# Patient Record
Sex: Female | Born: 1940 | Race: White | Hispanic: No | Smoking: Never smoker
Health system: Southern US, Community
[De-identification: ages and names within clinical notes are randomized; demographics above are authoritative.]

## PROBLEM LIST (undated history)

## (undated) DIAGNOSIS — M26609 Unspecified temporomandibular joint disorder, unspecified side: Secondary | ICD-10-CM

## (undated) DIAGNOSIS — Z923 Personal history of irradiation: Secondary | ICD-10-CM

## (undated) DIAGNOSIS — A498 Other bacterial infections of unspecified site: Secondary | ICD-10-CM

## (undated) DIAGNOSIS — T4145XA Adverse effect of unspecified anesthetic, initial encounter: Secondary | ICD-10-CM

## (undated) DIAGNOSIS — K610 Anal abscess: Secondary | ICD-10-CM

## (undated) DIAGNOSIS — R0989 Other specified symptoms and signs involving the circulatory and respiratory systems: Secondary | ICD-10-CM

## (undated) DIAGNOSIS — K219 Gastro-esophageal reflux disease without esophagitis: Secondary | ICD-10-CM

## (undated) DIAGNOSIS — C4491 Basal cell carcinoma of skin, unspecified: Secondary | ICD-10-CM

## (undated) DIAGNOSIS — J302 Other seasonal allergic rhinitis: Secondary | ICD-10-CM

## (undated) DIAGNOSIS — C50319 Malignant neoplasm of lower-inner quadrant of unspecified female breast: Secondary | ICD-10-CM

## (undated) DIAGNOSIS — Z9889 Other specified postprocedural states: Secondary | ICD-10-CM

## (undated) DIAGNOSIS — K50114 Crohn's disease of large intestine with abscess: Secondary | ICD-10-CM

## (undated) DIAGNOSIS — C50312 Malignant neoplasm of lower-inner quadrant of left female breast: Secondary | ICD-10-CM

## (undated) DIAGNOSIS — M1990 Unspecified osteoarthritis, unspecified site: Secondary | ICD-10-CM

## (undated) DIAGNOSIS — E785 Hyperlipidemia, unspecified: Secondary | ICD-10-CM

## (undated) DIAGNOSIS — F419 Anxiety disorder, unspecified: Secondary | ICD-10-CM

## (undated) DIAGNOSIS — F329 Major depressive disorder, single episode, unspecified: Secondary | ICD-10-CM

## (undated) DIAGNOSIS — Z5189 Encounter for other specified aftercare: Secondary | ICD-10-CM

## (undated) DIAGNOSIS — D649 Anemia, unspecified: Secondary | ICD-10-CM

## (undated) DIAGNOSIS — Z8489 Family history of other specified conditions: Secondary | ICD-10-CM

## (undated) DIAGNOSIS — Z98811 Dental restoration status: Secondary | ICD-10-CM

## (undated) DIAGNOSIS — R112 Nausea with vomiting, unspecified: Secondary | ICD-10-CM

## (undated) DIAGNOSIS — C3490 Malignant neoplasm of unspecified part of unspecified bronchus or lung: Secondary | ICD-10-CM

## (undated) DIAGNOSIS — F32A Depression, unspecified: Secondary | ICD-10-CM

## (undated) DIAGNOSIS — T7840XA Allergy, unspecified, initial encounter: Secondary | ICD-10-CM

## (undated) DIAGNOSIS — K30 Functional dyspepsia: Secondary | ICD-10-CM

## (undated) DIAGNOSIS — T8859XA Other complications of anesthesia, initial encounter: Secondary | ICD-10-CM

## (undated) DIAGNOSIS — E538 Deficiency of other specified B group vitamins: Secondary | ICD-10-CM

## (undated) DIAGNOSIS — K631 Perforation of intestine (nontraumatic): Secondary | ICD-10-CM

## (undated) HISTORY — DX: Encounter for other specified aftercare: Z51.89

## (undated) HISTORY — PX: BASAL CELL CARCINOMA EXCISION: SHX1214

## (undated) HISTORY — DX: Anemia, unspecified: D64.9

## (undated) HISTORY — DX: Hyperlipidemia, unspecified: E78.5

## (undated) HISTORY — DX: Other bacterial infections of unspecified site: A49.8

## (undated) HISTORY — DX: Unspecified osteoarthritis, unspecified site: M19.90

## (undated) HISTORY — DX: Allergy, unspecified, initial encounter: T78.40XA

## (undated) HISTORY — PX: BREAST ENHANCEMENT SURGERY: SHX7

## (undated) HISTORY — DX: Malignant neoplasm of lower-inner quadrant of unspecified female breast: C50.319

## (undated) HISTORY — PX: COLONOSCOPY WITH PROPOFOL: SHX5780

## (undated) HISTORY — DX: Basal cell carcinoma of skin, unspecified: C44.91

## (undated) HISTORY — DX: Malignant neoplasm of unspecified part of unspecified bronchus or lung: C34.90

## (undated) HISTORY — PX: INNER EAR SURGERY: SHX679

## (undated) HISTORY — DX: Deficiency of other specified B group vitamins: E53.8

## (undated) HISTORY — DX: Gastro-esophageal reflux disease without esophagitis: K21.9

## (undated) HISTORY — DX: Depression, unspecified: F32.A

## (undated) HISTORY — PX: APPENDECTOMY: SHX54

## (undated) HISTORY — DX: Major depressive disorder, single episode, unspecified: F32.9

## (undated) HISTORY — PX: AUGMENTATION MAMMAPLASTY: SUR837

## (undated) HISTORY — DX: Anxiety disorder, unspecified: F41.9

## (undated) HISTORY — PX: ABDOMINAL HYSTERECTOMY: SHX81

## (undated) HISTORY — DX: Perforation of intestine (nontraumatic): K63.1

---

## 1998-10-04 ENCOUNTER — Ambulatory Visit (HOSPITAL_COMMUNITY): Admission: RE | Admit: 1998-10-04 | Discharge: 1998-10-04 | Payer: Self-pay | Admitting: Gastroenterology

## 1999-05-19 ENCOUNTER — Encounter: Payer: Self-pay | Admitting: Emergency Medicine

## 1999-05-19 ENCOUNTER — Emergency Department (HOSPITAL_COMMUNITY): Admission: EM | Admit: 1999-05-19 | Discharge: 1999-05-19 | Payer: Self-pay | Admitting: Emergency Medicine

## 2003-06-18 ENCOUNTER — Emergency Department (HOSPITAL_COMMUNITY): Admission: EM | Admit: 2003-06-18 | Discharge: 2003-06-18 | Payer: Self-pay | Admitting: Emergency Medicine

## 2004-03-15 ENCOUNTER — Ambulatory Visit: Payer: Self-pay | Admitting: Oncology

## 2004-04-26 ENCOUNTER — Emergency Department (HOSPITAL_COMMUNITY): Admission: EM | Admit: 2004-04-26 | Discharge: 2004-04-26 | Payer: Self-pay | Admitting: Emergency Medicine

## 2004-05-10 ENCOUNTER — Ambulatory Visit: Payer: Self-pay | Admitting: Oncology

## 2004-06-04 ENCOUNTER — Other Ambulatory Visit: Admission: RE | Admit: 2004-06-04 | Discharge: 2004-06-04 | Payer: Self-pay | Admitting: Family Medicine

## 2004-06-04 ENCOUNTER — Ambulatory Visit: Payer: Self-pay | Admitting: Family Medicine

## 2004-06-12 ENCOUNTER — Ambulatory Visit: Payer: Self-pay | Admitting: Family Medicine

## 2004-06-27 ENCOUNTER — Encounter: Admission: RE | Admit: 2004-06-27 | Discharge: 2004-06-27 | Payer: Self-pay | Admitting: Family Medicine

## 2004-07-04 ENCOUNTER — Ambulatory Visit: Payer: Self-pay | Admitting: Oncology

## 2004-07-08 ENCOUNTER — Encounter: Admission: RE | Admit: 2004-07-08 | Discharge: 2004-07-08 | Payer: Self-pay | Admitting: Family Medicine

## 2004-07-09 ENCOUNTER — Ambulatory Visit: Payer: Self-pay | Admitting: Family Medicine

## 2004-07-09 ENCOUNTER — Encounter: Admission: RE | Admit: 2004-07-09 | Discharge: 2004-07-09 | Payer: Self-pay | Admitting: Family Medicine

## 2004-08-14 ENCOUNTER — Ambulatory Visit: Payer: Self-pay | Admitting: Gastroenterology

## 2004-08-29 ENCOUNTER — Ambulatory Visit: Payer: Self-pay | Admitting: Gastroenterology

## 2004-08-29 ENCOUNTER — Ambulatory Visit: Payer: Self-pay | Admitting: Oncology

## 2004-11-13 ENCOUNTER — Ambulatory Visit: Payer: Self-pay | Admitting: Oncology

## 2005-01-02 ENCOUNTER — Ambulatory Visit: Payer: Self-pay | Admitting: Family Medicine

## 2005-01-08 ENCOUNTER — Ambulatory Visit: Payer: Self-pay | Admitting: Oncology

## 2005-01-08 ENCOUNTER — Ambulatory Visit: Payer: Self-pay | Admitting: Family Medicine

## 2005-01-10 ENCOUNTER — Ambulatory Visit: Payer: Self-pay | Admitting: Family Medicine

## 2005-01-17 ENCOUNTER — Ambulatory Visit: Payer: Self-pay | Admitting: Family Medicine

## 2005-01-24 ENCOUNTER — Ambulatory Visit: Payer: Self-pay | Admitting: Family Medicine

## 2005-01-31 ENCOUNTER — Ambulatory Visit: Payer: Self-pay | Admitting: Family Medicine

## 2005-02-05 ENCOUNTER — Ambulatory Visit: Payer: Self-pay | Admitting: Family Medicine

## 2005-02-12 ENCOUNTER — Ambulatory Visit: Payer: Self-pay | Admitting: Family Medicine

## 2005-02-14 ENCOUNTER — Ambulatory Visit: Payer: Self-pay | Admitting: Cardiovascular Disease

## 2005-02-26 ENCOUNTER — Ambulatory Visit: Payer: Self-pay | Admitting: Internal Medicine

## 2005-02-28 ENCOUNTER — Ambulatory Visit: Payer: Self-pay | Admitting: Family Medicine

## 2005-03-03 ENCOUNTER — Ambulatory Visit: Payer: Self-pay | Admitting: Internal Medicine

## 2005-03-28 ENCOUNTER — Ambulatory Visit: Payer: Self-pay | Admitting: Family Medicine

## 2005-05-02 ENCOUNTER — Ambulatory Visit: Payer: Self-pay | Admitting: Family Medicine

## 2005-05-30 ENCOUNTER — Ambulatory Visit: Payer: Self-pay | Admitting: Family Medicine

## 2005-06-27 ENCOUNTER — Ambulatory Visit: Payer: Self-pay | Admitting: Family Medicine

## 2005-07-11 ENCOUNTER — Ambulatory Visit: Payer: Self-pay | Admitting: Family Medicine

## 2005-07-28 ENCOUNTER — Ambulatory Visit: Payer: Self-pay | Admitting: Family Medicine

## 2005-08-28 ENCOUNTER — Ambulatory Visit: Payer: Self-pay | Admitting: Family Medicine

## 2005-09-30 ENCOUNTER — Ambulatory Visit: Payer: Self-pay | Admitting: Family Medicine

## 2005-10-31 ENCOUNTER — Ambulatory Visit: Payer: Self-pay | Admitting: Family Medicine

## 2005-11-28 ENCOUNTER — Ambulatory Visit: Payer: Self-pay | Admitting: Family Medicine

## 2005-12-26 ENCOUNTER — Ambulatory Visit: Payer: Self-pay | Admitting: Family Medicine

## 2005-12-30 ENCOUNTER — Encounter: Admission: RE | Admit: 2005-12-30 | Discharge: 2005-12-30 | Payer: Self-pay | Admitting: Family Medicine

## 2005-12-30 ENCOUNTER — Ambulatory Visit: Payer: Self-pay | Admitting: Family Medicine

## 2006-01-01 ENCOUNTER — Encounter: Admission: RE | Admit: 2006-01-01 | Discharge: 2006-01-01 | Payer: Self-pay | Admitting: Family Medicine

## 2006-01-16 ENCOUNTER — Encounter: Admission: RE | Admit: 2006-01-16 | Discharge: 2006-01-16 | Payer: Self-pay | Admitting: Family Medicine

## 2006-01-20 ENCOUNTER — Ambulatory Visit: Payer: Self-pay | Admitting: Family Medicine

## 2006-01-23 ENCOUNTER — Ambulatory Visit: Payer: Self-pay | Admitting: Family Medicine

## 2006-02-20 ENCOUNTER — Ambulatory Visit: Payer: Self-pay | Admitting: Family Medicine

## 2006-03-09 ENCOUNTER — Ambulatory Visit: Payer: Self-pay | Admitting: Family Medicine

## 2006-03-25 ENCOUNTER — Ambulatory Visit: Payer: Self-pay | Admitting: Family Medicine

## 2006-04-22 ENCOUNTER — Ambulatory Visit: Payer: Self-pay | Admitting: Family Medicine

## 2006-05-22 ENCOUNTER — Ambulatory Visit: Payer: Self-pay | Admitting: Family Medicine

## 2006-06-11 ENCOUNTER — Ambulatory Visit: Payer: Self-pay | Admitting: Family Medicine

## 2006-07-02 ENCOUNTER — Ambulatory Visit: Payer: Self-pay | Admitting: Family Medicine

## 2006-08-03 ENCOUNTER — Ambulatory Visit: Payer: Self-pay | Admitting: Family Medicine

## 2006-08-12 ENCOUNTER — Emergency Department (HOSPITAL_COMMUNITY): Admission: EM | Admit: 2006-08-12 | Discharge: 2006-08-12 | Payer: Self-pay | Admitting: Emergency Medicine

## 2006-08-12 DIAGNOSIS — K449 Diaphragmatic hernia without obstruction or gangrene: Secondary | ICD-10-CM | POA: Insufficient documentation

## 2006-08-12 DIAGNOSIS — F32A Depression, unspecified: Secondary | ICD-10-CM | POA: Insufficient documentation

## 2006-08-12 DIAGNOSIS — F329 Major depressive disorder, single episode, unspecified: Secondary | ICD-10-CM

## 2006-08-12 DIAGNOSIS — E538 Deficiency of other specified B group vitamins: Secondary | ICD-10-CM | POA: Insufficient documentation

## 2006-08-15 DIAGNOSIS — Z8719 Personal history of other diseases of the digestive system: Secondary | ICD-10-CM | POA: Insufficient documentation

## 2006-08-31 ENCOUNTER — Ambulatory Visit: Payer: Self-pay | Admitting: Family Medicine

## 2006-09-11 ENCOUNTER — Ambulatory Visit: Payer: Self-pay | Admitting: Family Medicine

## 2006-09-11 LAB — CONVERTED CEMR LAB
ALT: 20 units/L (ref 0–40)
AST: 21 units/L (ref 0–37)
Albumin: 4.1 g/dL (ref 3.5–5.2)
BUN: 13 mg/dL (ref 6–23)
Basophils Absolute: 0 10*3/uL (ref 0.0–0.1)
Bilirubin, Direct: 0.1 mg/dL (ref 0.0–0.3)
Calcium: 9.5 mg/dL (ref 8.4–10.5)
GFR calc Af Amer: 108 mL/min
GFR calc non Af Amer: 90 mL/min
HCT: 44.5 % (ref 36.0–46.0)
Hemoglobin: 15.4 g/dL — ABNORMAL HIGH (ref 12.0–15.0)
Lymphocytes Relative: 22.2 % (ref 12.0–46.0)
MCV: 92.4 fL (ref 78.0–100.0)
Neutro Abs: 4.1 10*3/uL (ref 1.4–7.7)
Neutrophils Relative %: 67.7 % (ref 43.0–77.0)
Platelets: 180 10*3/uL (ref 150–400)
RBC: 4.81 M/uL (ref 3.87–5.11)
RDW: 12 % (ref 11.5–14.6)
TSH: 0.93 microintl units/mL (ref 0.35–5.50)
Total CHOL/HDL Ratio: 5
Total Protein: 7 g/dL (ref 6.0–8.3)
VLDL: 36 mg/dL (ref 0–40)

## 2006-09-28 ENCOUNTER — Ambulatory Visit: Payer: Self-pay | Admitting: Family Medicine

## 2006-09-28 ENCOUNTER — Encounter (INDEPENDENT_AMBULATORY_CARE_PROVIDER_SITE_OTHER): Payer: Self-pay | Admitting: *Deleted

## 2006-10-14 ENCOUNTER — Encounter: Admission: RE | Admit: 2006-10-14 | Discharge: 2006-10-14 | Payer: Self-pay | Admitting: Family Medicine

## 2006-10-14 ENCOUNTER — Encounter: Payer: Self-pay | Admitting: Family Medicine

## 2006-10-19 ENCOUNTER — Encounter (INDEPENDENT_AMBULATORY_CARE_PROVIDER_SITE_OTHER): Payer: Self-pay | Admitting: *Deleted

## 2006-10-20 ENCOUNTER — Encounter (INDEPENDENT_AMBULATORY_CARE_PROVIDER_SITE_OTHER): Payer: Self-pay | Admitting: *Deleted

## 2006-10-23 ENCOUNTER — Ambulatory Visit: Payer: Self-pay | Admitting: Family Medicine

## 2006-11-23 ENCOUNTER — Ambulatory Visit: Payer: Self-pay | Admitting: Family Medicine

## 2006-12-23 ENCOUNTER — Ambulatory Visit: Payer: Self-pay | Admitting: Family Medicine

## 2007-01-19 ENCOUNTER — Telehealth (INDEPENDENT_AMBULATORY_CARE_PROVIDER_SITE_OTHER): Payer: Self-pay | Admitting: *Deleted

## 2007-01-20 ENCOUNTER — Ambulatory Visit: Payer: Self-pay | Admitting: Family Medicine

## 2007-03-08 ENCOUNTER — Ambulatory Visit: Payer: Self-pay | Admitting: Family Medicine

## 2007-03-10 ENCOUNTER — Encounter: Admission: RE | Admit: 2007-03-10 | Discharge: 2007-03-10 | Payer: Self-pay | Admitting: Family Medicine

## 2007-07-27 ENCOUNTER — Ambulatory Visit: Payer: Self-pay | Admitting: Family Medicine

## 2007-07-27 DIAGNOSIS — D235 Other benign neoplasm of skin of trunk: Secondary | ICD-10-CM | POA: Insufficient documentation

## 2007-07-30 ENCOUNTER — Encounter (INDEPENDENT_AMBULATORY_CARE_PROVIDER_SITE_OTHER): Payer: Self-pay | Admitting: *Deleted

## 2007-08-05 ENCOUNTER — Ambulatory Visit: Payer: Self-pay | Admitting: Family Medicine

## 2007-12-10 ENCOUNTER — Ambulatory Visit: Payer: Self-pay | Admitting: Family Medicine

## 2007-12-13 ENCOUNTER — Ambulatory Visit: Payer: Self-pay | Admitting: Family Medicine

## 2007-12-13 ENCOUNTER — Encounter (INDEPENDENT_AMBULATORY_CARE_PROVIDER_SITE_OTHER): Payer: Self-pay | Admitting: *Deleted

## 2007-12-15 ENCOUNTER — Telehealth: Payer: Self-pay | Admitting: Internal Medicine

## 2007-12-17 ENCOUNTER — Telehealth (INDEPENDENT_AMBULATORY_CARE_PROVIDER_SITE_OTHER): Payer: Self-pay | Admitting: *Deleted

## 2007-12-17 LAB — CONVERTED CEMR LAB: Vit D, 1,25-Dihydroxy: 36 (ref 30–89)

## 2007-12-23 ENCOUNTER — Telehealth (INDEPENDENT_AMBULATORY_CARE_PROVIDER_SITE_OTHER): Payer: Self-pay | Admitting: *Deleted

## 2007-12-25 LAB — CONVERTED CEMR LAB
BUN: 11 mg/dL (ref 6–23)
CO2: 29 meq/L (ref 19–32)
Calcium: 9.3 mg/dL (ref 8.4–10.5)
Chloride: 108 meq/L (ref 96–112)
Creatinine, Ser: 0.7 mg/dL (ref 0.4–1.2)
Eosinophils Absolute: 0.1 10*3/uL (ref 0.0–0.7)
GFR calc non Af Amer: 89 mL/min
Glucose, Bld: 95 mg/dL (ref 70–99)
HCT: 45.1 % (ref 36.0–46.0)
HDL: 37.5 mg/dL — ABNORMAL LOW (ref >39.0)
Hemoglobin: 15.6 g/dL — ABNORMAL HIGH (ref 12.0–15.0)
LDL Cholesterol: 125 mg/dL — ABNORMAL HIGH (ref 0–99)
MCHC: 34.6 g/dL (ref 30.0–36.0)
MCV: 93.5 fL (ref 78.0–100.0)
Neutro Abs: 4.6 10*3/uL (ref 1.4–7.7)
Potassium: 4.8 meq/L (ref 3.5–5.1)
RBC: 4.82 M/uL (ref 3.87–5.11)
RDW: 11.6 % (ref 11.5–14.6)
Total CHOL/HDL Ratio: 5.3
Vitamin B-12: 270 pg/mL (ref 211–911)
WBC: 7.1 10*3/uL (ref 4.5–10.5)

## 2007-12-27 ENCOUNTER — Encounter (INDEPENDENT_AMBULATORY_CARE_PROVIDER_SITE_OTHER): Payer: Self-pay | Admitting: *Deleted

## 2008-01-20 ENCOUNTER — Ambulatory Visit: Payer: Self-pay | Admitting: Family Medicine

## 2008-01-20 ENCOUNTER — Encounter: Payer: Self-pay | Admitting: Internal Medicine

## 2008-01-20 DIAGNOSIS — K589 Irritable bowel syndrome without diarrhea: Secondary | ICD-10-CM

## 2008-01-20 DIAGNOSIS — F341 Dysthymic disorder: Secondary | ICD-10-CM

## 2008-01-25 ENCOUNTER — Ambulatory Visit: Payer: Self-pay | Admitting: Internal Medicine

## 2008-01-25 ENCOUNTER — Encounter (INDEPENDENT_AMBULATORY_CARE_PROVIDER_SITE_OTHER): Payer: Self-pay | Admitting: *Deleted

## 2008-01-25 LAB — CONVERTED CEMR LAB: Vit D, 1,25-Dihydroxy: 93 — ABNORMAL HIGH (ref 30–89)

## 2008-02-03 ENCOUNTER — Ambulatory Visit: Payer: Self-pay | Admitting: Internal Medicine

## 2008-02-03 ENCOUNTER — Encounter: Payer: Self-pay | Admitting: Internal Medicine

## 2008-02-04 ENCOUNTER — Encounter: Payer: Self-pay | Admitting: Internal Medicine

## 2008-03-30 ENCOUNTER — Encounter: Admission: RE | Admit: 2008-03-30 | Discharge: 2008-03-30 | Payer: Self-pay | Admitting: Family Medicine

## 2008-04-12 ENCOUNTER — Encounter: Admission: RE | Admit: 2008-04-12 | Discharge: 2008-04-12 | Payer: Self-pay | Admitting: Family Medicine

## 2008-04-12 ENCOUNTER — Encounter: Payer: Self-pay | Admitting: Family Medicine

## 2008-04-18 ENCOUNTER — Encounter (INDEPENDENT_AMBULATORY_CARE_PROVIDER_SITE_OTHER): Payer: Self-pay | Admitting: *Deleted

## 2008-05-29 ENCOUNTER — Ambulatory Visit: Payer: Self-pay | Admitting: Family Medicine

## 2008-05-29 DIAGNOSIS — J019 Acute sinusitis, unspecified: Secondary | ICD-10-CM | POA: Insufficient documentation

## 2008-10-06 ENCOUNTER — Ambulatory Visit: Payer: Self-pay | Admitting: Family Medicine

## 2008-10-06 DIAGNOSIS — R079 Chest pain, unspecified: Secondary | ICD-10-CM | POA: Insufficient documentation

## 2008-12-11 ENCOUNTER — Ambulatory Visit: Payer: Self-pay | Admitting: Family Medicine

## 2008-12-11 DIAGNOSIS — D239 Other benign neoplasm of skin, unspecified: Secondary | ICD-10-CM | POA: Insufficient documentation

## 2008-12-11 DIAGNOSIS — J329 Chronic sinusitis, unspecified: Secondary | ICD-10-CM | POA: Insufficient documentation

## 2008-12-11 LAB — CONVERTED CEMR LAB
Bilirubin Urine: NEGATIVE
Blood in Urine, dipstick: NEGATIVE
Glucose, Urine, Semiquant: NEGATIVE
Nitrite: NEGATIVE
Protein, U semiquant: NEGATIVE
Urobilinogen, UA: NEGATIVE
WBC Urine, dipstick: NEGATIVE

## 2008-12-14 ENCOUNTER — Encounter (INDEPENDENT_AMBULATORY_CARE_PROVIDER_SITE_OTHER): Payer: Self-pay | Admitting: *Deleted

## 2008-12-14 LAB — CONVERTED CEMR LAB
ALT: 16 units/L (ref 0–35)
Albumin: 4.1 g/dL (ref 3.5–5.2)
Alkaline Phosphatase: 75 units/L (ref 39–117)
Basophils Absolute: 0 10*3/uL (ref 0.0–0.1)
Calcium: 9.4 mg/dL (ref 8.4–10.5)
Chloride: 106 meq/L (ref 96–112)
Eosinophils Absolute: 0.1 10*3/uL (ref 0.0–0.7)
Eosinophils Relative: 1.1 % (ref 0.0–5.0)
Glucose, Bld: 97 mg/dL (ref 70–99)
HCT: 43.6 % (ref 36.0–46.0)
HDL: 41.7 mg/dL (ref >39.00)
Lymphocytes Relative: 23.6 % (ref 12.0–46.0)
MCHC: 35.6 g/dL (ref 30.0–36.0)
MCV: 94.2 fL (ref 78.0–100.0)
Monocytes Absolute: 0.3 10*3/uL (ref 0.1–1.0)
Monocytes Relative: 5 % (ref 3.0–12.0)
Neutro Abs: 4.4 10*3/uL (ref 1.4–7.7)
Platelets: 190 10*3/uL (ref 150.0–400.0)
RDW: 11.4 % — ABNORMAL LOW (ref 11.5–14.6)
Sodium: 138 meq/L (ref 135–145)
Total CHOL/HDL Ratio: 4
Vitamin B-12: 450 pg/mL (ref 211–911)

## 2008-12-20 ENCOUNTER — Encounter: Payer: Self-pay | Admitting: Family Medicine

## 2008-12-22 ENCOUNTER — Encounter: Payer: Self-pay | Admitting: Family Medicine

## 2008-12-27 ENCOUNTER — Encounter: Admission: RE | Admit: 2008-12-27 | Discharge: 2008-12-27 | Payer: Self-pay | Admitting: Otolaryngology

## 2009-01-03 ENCOUNTER — Telehealth (INDEPENDENT_AMBULATORY_CARE_PROVIDER_SITE_OTHER): Payer: Self-pay | Admitting: *Deleted

## 2009-05-21 ENCOUNTER — Telehealth (INDEPENDENT_AMBULATORY_CARE_PROVIDER_SITE_OTHER): Payer: Self-pay | Admitting: *Deleted

## 2009-05-23 ENCOUNTER — Telehealth: Payer: Self-pay | Admitting: Family Medicine

## 2009-08-21 ENCOUNTER — Telehealth (INDEPENDENT_AMBULATORY_CARE_PROVIDER_SITE_OTHER): Payer: Self-pay | Admitting: *Deleted

## 2009-10-12 ENCOUNTER — Telehealth (INDEPENDENT_AMBULATORY_CARE_PROVIDER_SITE_OTHER): Payer: Self-pay | Admitting: *Deleted

## 2009-12-13 ENCOUNTER — Ambulatory Visit: Payer: Self-pay | Admitting: Family Medicine

## 2009-12-13 DIAGNOSIS — E559 Vitamin D deficiency, unspecified: Secondary | ICD-10-CM | POA: Insufficient documentation

## 2009-12-13 DIAGNOSIS — Z780 Asymptomatic menopausal state: Secondary | ICD-10-CM | POA: Insufficient documentation

## 2009-12-13 LAB — CONVERTED CEMR LAB
Bilirubin Urine: NEGATIVE
Glucose, Urine, Semiquant: NEGATIVE
Ketones, urine, test strip: NEGATIVE
pH: 5

## 2009-12-14 ENCOUNTER — Telehealth: Payer: Self-pay | Admitting: Family Medicine

## 2009-12-14 ENCOUNTER — Encounter: Payer: Self-pay | Admitting: Family Medicine

## 2009-12-14 LAB — CONVERTED CEMR LAB
ALT: 15 units/L (ref 0–35)
Alkaline Phosphatase: 86 units/L (ref 39–117)
Basophils Absolute: 0 10*3/uL (ref 0.0–0.1)
Basophils Relative: 0.6 % (ref 0.0–3.0)
Bilirubin, Direct: 0.2 mg/dL (ref 0.0–0.3)
CO2: 27 meq/L (ref 19–32)
Creatinine, Ser: 0.7 mg/dL (ref 0.4–1.2)
Eosinophils Absolute: 0.1 10*3/uL (ref 0.0–0.7)
GFR calc non Af Amer: 96.3 mL/min (ref >60)
Glucose, Bld: 86 mg/dL (ref 70–99)
HCT: 45.8 % (ref 36.0–46.0)
Lymphocytes Relative: 18.7 % (ref 12.0–46.0)
Lymphs Abs: 1.7 10*3/uL (ref 0.7–4.0)
Monocytes Absolute: 0.5 10*3/uL (ref 0.1–1.0)
Potassium: 4.4 meq/L (ref 3.5–5.1)
Total CHOL/HDL Ratio: 4
Total Protein: 7.1 g/dL (ref 6.0–8.3)

## 2009-12-17 LAB — CONVERTED CEMR LAB: Vit D, 25-Hydroxy: 40 ng/mL (ref 30–89)

## 2010-01-03 ENCOUNTER — Encounter: Admission: RE | Admit: 2010-01-03 | Discharge: 2010-01-03 | Payer: Self-pay | Admitting: Family Medicine

## 2010-01-03 ENCOUNTER — Encounter: Payer: Self-pay | Admitting: Family Medicine

## 2010-01-12 DIAGNOSIS — M899 Disorder of bone, unspecified: Secondary | ICD-10-CM | POA: Insufficient documentation

## 2010-01-12 DIAGNOSIS — M949 Disorder of cartilage, unspecified: Secondary | ICD-10-CM

## 2010-03-14 ENCOUNTER — Ambulatory Visit: Payer: Self-pay | Admitting: Family Medicine

## 2010-03-14 DIAGNOSIS — M79609 Pain in unspecified limb: Secondary | ICD-10-CM | POA: Insufficient documentation

## 2010-03-18 LAB — CONVERTED CEMR LAB
AST: 23 units/L (ref 0–37)
Albumin: 4.1 g/dL (ref 3.5–5.2)
Cholesterol: 190 mg/dL (ref 0–200)
Total Bilirubin: 0.5 mg/dL (ref 0.3–1.2)
Total CHOL/HDL Ratio: 4
Total Protein: 7 g/dL (ref 6.0–8.3)
Triglycerides: 167 mg/dL — ABNORMAL HIGH (ref 0.0–149.0)

## 2010-04-09 ENCOUNTER — Telehealth: Payer: Self-pay | Admitting: Family Medicine

## 2010-05-06 ENCOUNTER — Encounter: Payer: Self-pay | Admitting: Family Medicine

## 2010-05-19 ENCOUNTER — Encounter: Payer: Self-pay | Admitting: Family Medicine

## 2010-05-28 NOTE — Progress Notes (Signed)
Summary: refill  Phone Note Refill Request Message from:  Fax from Pharmacy on [address] fax [phone]  Refills Requested: Medication #1:  CYANOCOBALAMIN 1000 MCG/ML SOLN Insert 1 ml into deltoid IM once per month.  PHARMACY- please include appropriate neele/syringes FLUOXETINE 20MG  Initial call taken by: Silva Bandy,  May 21, 2009 4:43 PM    Prescriptions: CYANOCOBALAMIN 1000 MCG/ML SOLN (CYANOCOBALAMIN) Insert 1 ml into deltoid IM once per month.  PHARMACY- please include appropriate neele/syringes  #1 vial x 2   Entered by:   Allyn Kenner CMA   Authorized by:   [name] DO   Signed by:   Allyn Kenner CMA on 05/21/2009   Method used:   Electronically to        [facility] (retail)       [address] [facility]       [place], [place]  [address]       Ph: [phone] or [phone]       Fax: [phone]   RxID:   [id number]
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
## 2010-05-28 NOTE — Progress Notes (Signed)
Summary: Refill Request  Phone Note Refill Request Call back at [phone] Message from:  Pharmacy on August 21, 2009 11:41 AM  Refills Requested: Medication #1:  CYANOCOBALAMIN 1000 MCG/ML SOLN Insert 1 ml into deltoid IM once per month.  PHARMACY- please include appropriate neele/syringes   Dosage confirmed as above?Dosage Confirmed   Last Refilled: 05/21/2009 Kristopher Oppenheim on [address].   Next Appointment Scheduled: none Initial call taken by: Elna Breslow,  August 21, 2009 11:41 AM    New/Updated Medications: CYANOCOBALAMIN 1000 MCG/ML SOLN (CYANOCOBALAMIN) Insert 1 ml into deltoid IM once per month.  PHARMACY- please include appropriate neele/syringes. NEEDS LABWORK. Prescriptions: CYANOCOBALAMIN 1000 MCG/ML SOLN (CYANOCOBALAMIN) Insert 1 ml into deltoid IM once per month.  PHARMACY- please include appropriate neele/syringes. NEEDS LABWORK.  #1 x 0   Entered by:   Allyn Kenner CMA   Authorized by:   [name] DO   Signed by:   Allyn Kenner CMA on 08/21/2009   Method used:   Electronically to        [facility] (retail)       [address] [facility]       [place], [place]  [address]       Ph: [phone] or [phone]       Fax: [phone]   RxID:   [id number]

## 2010-05-28 NOTE — Progress Notes (Signed)
Summary: Lab results  Phone Note Outgoing Call   Call placed by: Allyn Kenner CMA,  December 14, 2009 2:56 PM Summary of Call: Lab Results, LMTCB:  TG have increased--- take tricor 173m  #30  1 by mouth once daily , 2 refills---recheck 3 months-----hep, lipid 272.4 Signed by YGarnet KoyanagiDO on 12/13/2009 at 8:34 PM   Follow-up for Phone Call        Patient notified. Follow-up by: KErnestene MentionCMA,  December 14, 2009 4:39 PM    New/Updated Medications: TRICOR 145 MG TABS (FENOFIBRATE) 1 by mouth qd Prescriptions: TRICOR 145 MG TABS (FENOFIBRATE) 1 by mouth qd  #30 x 2   Entered by:   KErnestene MentionCMA   Authorized by:   YGarnet KoyanagiDO   Signed by:   KErnestene MentionCMA on 12/14/2009   Method used:   Electronically to        [facility] (retail)       [address] [facility]       [place]  [address]      Ph: [phone] [phone]      Fax: [phone]  RxID:   [id number]

## 2010-05-28 NOTE — Assessment & Plan Note (Signed)
Summary: shoulder pain- lab/cbs   Vital Signs:  Patient profile:   70 year old female Weight:      159.0 pounds Temp:     98.4 degrees F oral Pulse rate:   72 / minute Pulse rhythm:   regular BP sitting:   130 / 90  (right arm) Cuff size:   regular  Vitals Entered By: Aron Baba CMA Deborra Medina) (March 14, 2010 8:20 AM) CC: x39moc/o pain in the left shoulder and arm that is gradully getting worst with movement-- Due for 3 mo labs/never started Tricor Pain Assessment Patient in pain? yes     Location: shoulder Intensity: 8 Type: aching Onset of pain  With activity   History of Present Illness: Pt here c/o L arm pain x 5 months but it has gotten worse over the last 1 month.  pt thought it might have been from B12 shots but has gotten them in her hip since.  She had been moving things into new house for several months but she is r handed.   Pain is in upper arm and sometimes in shoulder when its really bad and on occasion numbness in low arm and hand.  Pt tried icy hot patch , aspirin and aleve with no relief.     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 1 By Mouth Once Daily 2)  Cyanocobalamin 1000 Mcg/ml Soln (Cyanocobalamin) ..Marland Kitchen. 1 Ml Subcutaneously Monthly 3)  Vitamin D3 1000 Unit Tabs (Cholecalciferol) ..Marland Kitchen. 1 By Mouth Once Daily 4)  Zostavax 19400 Unt/0.647mSolr (Zoster Vaccine Live) ...Marland Kitchen 1 Ml  Im X1 5)  Tricor 145 Mg Tabs (Fenofibrate) ...Marland Kitchen 1 By Mouth Qd  Current Medications (verified): 1)  Prozac 20 Mg  Caps (Fluoxetine Hcl) ...Marland Kitchen 1 By Mouth Once Daily 2)  Cyanocobalamin 1000 Mcg/ml Soln (Cyanocobalamin) ...Marland Kitchen 1 Ml Subcutaneously Monthly 3)  Vitamin D3 1000 Unit Tabs (Cholecalciferol) ...Marland Kitchen 1 By Mouth Once Daily 4)  Zostavax 19400 Unt/0.6515molr (Zoster Vaccine Live) ....Marland Kitchen1 Ml  Im X1 5)  Prednisone 10 Mg Tabs (Prednisone) .... 3 By Mouth Once Daily For 3 Days 2 By Mouth Once Daily For 3 Days Then 1 By Mouth Once Daily For 3 Days  Allergies (verified): 1)  ! Codeine 2)  * Sulfa (Sulfonamides) Group  Past History:  Past Medical History: Last updated: 12/10/2007 Depression Current  Problems:  VITAMIN B12 DEFICIENCY (ICD-266.2) BENIGN NEOPLASM OF SKIN OF TRUNK EXCEPT SCROTUM (ICD-216.5) BREAST MASS, RIGHT (ICD-611.72) Family Hx of NEOPLASM, MALIGNANT, BRAIN, FAMILY HX (ICD-V16.8) Family Hx of NEOPLASM, MALIGNANT, ESOPHAGUS, FAMILY HX (ICD-V16.0) Family Hx of HX, FAMILY, MALIGNANCY, PROSTATE (ICD-V16.42) COLONOSCOPY, HX OF (ICD-V12.79) ULCERATIVE COLITIS, HX OF (ICD-V12.79) HIATAL HERNIA, HX OF (ICD-V12.79) BREAST IMPLANTS, BILATERAL, HX OF (ICD-V43.82) VITAMIN B12 DEFICIENCY (ICD-266.2) HIATAL HERNIA (ICD-553.3) DEPRESSION (ICD-311)  Past Surgical History: Last updated: 12/10/2007 Appendectomy Hysterectomy EAR IMPLANTS Lumpectomy (LEFT  BREAST) breast implants  Family History: Last updated: 01/20/2008 Family History Hypertension Family History of Prostate CA 1st degree relative <50--B F--brain tumor PGF--Gastric Cancer  Social History: Last updated: 12/10/2007 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 Family History Hypertension Family History of Prostate CA 1st degree relative <50--B F--brain tumor PGF--Gastric Cancer  Social History: Reviewed history from 12/10/2007 and no changes required. 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     Impression & Recommendations:  Problem # 1:  ARM PAIN, LEFT (ICD-729.5) rest sling prednisone taper alt ice and heat ortho if no improvement  Problem # 2:  HYPERLIPIDEMIA (ICD-272.4)  labs drawn today The following medications were removed from the medication list:    Tricor 145 Mg Tabs (Fenofibrate) .Marland Kitchen... 1 by mouth qd  Labs Reviewed: SGOT: 21 (12/13/2009)   SGPT: 15 (12/13/2009)   HDL:49.70 (12/13/2009), 41.70 (12/11/2008)  LDL:112 (12/11/2008), 125 (12/13/2007)  Chol:196 (12/13/2009), 186 (12/11/2008)  Trig:206.0 (12/13/2009), 163.0 (12/11/2008)  Complete Medication List: 1)  Prozac 20  Mg Caps (Fluoxetine hcl) .Marland Kitchen.. 1 by mouth once daily 2)  Cyanocobalamin 1000 Mcg/ml Soln (Cyanocobalamin) .Marland Kitchen.. 1 ml subcutaneously monthly 3)  Vitamin D3 1000 Unit Tabs (Cholecalciferol) .Marland Kitchen.. 1 by mouth once daily 4)  Zostavax 19400 Unt/0.45m Solr (Zoster vaccine live) ..Marland Kitchen. 1 ml  im x1 5)  Prednisone 10 Mg Tabs (Prednisone) .... 3 by mouth once daily for 3 days 2 by mouth once daily for 3 days then 1 by mouth once daily for 3 days  Other Orders: Venipuncture ((62563 Specimen Handling (99000) TLB-Lipid Panel (80061-LIPID) TLB-Hepatic/Liver Function Pnl (80076-HEPATIC) Prescriptions: PREDNISONE 10 MG TABS (PREDNISONE) 3 by mouth once daily for 3 days 2 by mouth once daily for 3 days then 1 by mouth once daily for 3 days  #18 x 0   Entered and Authorized by:   YGarnet KoyanagiDO   Signed by:   YGarnet KoyanagiDO on 03/14/2010   Method used:   Electronically to        [facility] ([facility]ail)       [address] [facility]       [place]  [address]      Ph: [phone] [phone]      Fax: [phone]  RxID:   [id number]  Orders Added: 1)  Venipuncture [68372] 2)  Specimen Handling [99000] 3)  TLB-Lipid Panel [80061-LIPID] 4)  TLB-Hepatic/Liver Function Pnl [80076-HEPATIC] 5)  Est. Patient Level III [90211]

## 2010-05-28 NOTE — Progress Notes (Signed)
Summary: pt will have labs [phone] at [facility]px  Phone Note Refill Request Call back at [phone] Message from:  Pharmacy on October 12, 2009 12:59 PM  Refills Requested: Medication #1:  CYANOCOBALAMIN 1000 MCG/ML SOLN Insert 1 ml into deltoid IM once per month.  PHARMACY- please include appropriate neele/syringes. NEEDS LABWORK.   Dosage confirmed as above?Dosage Confirmed   Supply Requested: 3 months   Last Refilled: 08/21/2009 [address]  Next Appointment Scheduled: AUG.18.2011 Initial call taken by: Osborn Coho,  October 12, 2009 1:00 PM  Follow-up for Phone Call        gave pt 1 refill, she has to come in for [facility] before additional refills. Allyn Kenner CMA  October 12, 2009 1:08 PM   Additional Follow-up for Phone Call Additional follow up Details #1::        called patient to schedule lab appt - patient said she has her cpx scheduled [id number] - per dr Etter Sjogren we can wait until augeust to do lab Additional Follow-up by: Arbie [place],  October 12, 2009 3:57 PM    Prescriptions: CYANOCOBALAMIN 1000 MCG/ML SOLN (CYANOCOBALAMIN) Insert 1 ml into deltoid IM once per month.  PHARMACY- please include appropriate neele/syringes. NEEDS LABWORK.  #1 x 0   Entered by:   Allyn Kenner CMA   Authorized by:   [name] DO   Signed by:   Allyn Kenner CMA on 10/12/2009   Method used:   Electronically to        [facility] (retail)       [address]       [place], [place]  [address]       Ph: [phone] or [phone]       Fax: [phone]   RxID:   [id number]
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
## 2010-05-30 NOTE — Progress Notes (Signed)
Summary: pain is increasing in shoulders, now in legs  Phone Note Call from Patient Call back at Home Phone [phone]   Caller: Patient Summary of Call: Patient was seen by Dr Etter Sjogren on 11/17 for left arm / shoulder pain----she called today to report that she is not better and is actually worse--says pain is also on right shoulder / arm and is in the back of both legs--since symptoms have greatly increased, does Dr Etter Sjogren want  to see her again or can she refer patient to an orthopedist??    Patient says she cant get comfortable to sleep  Please call her at [phone] Initial call taken by: Berneta Sages,  April 09, 2010 9:30 AM  Follow-up for Phone Call        Ortho referral put in per instruction in last OV. Pls advise on any further suggestion for Pt...........Marland KitchenFelecia Deloach CMA  April 09, 2010 9:45 AM   Additional Follow-up for Phone Call Additional follow up Details #1::        does she need pain meds?  ultram 50 mg 1 by mouth every 6 hours as needed  #30 Additional Follow-up by: [name] DO,  April 09, 2010 10:53 AM    Additional Follow-up for Phone Call Additional follow up Details #2::    advised the pt that referral put in and she will be contacted soon and Rx called to [facility] [address]...  Aron Baba CMA Deborra Medina)  April 09, 2010 11:18 AM   New/Updated Medications: ULTRAM 50 MG TABS (TRAMADOL HCL) 1 by mouth every 6 hours as needed Prescriptions: ULTRAM 50 MG TABS (TRAMADOL HCL) 1 by mouth every 6 hours as needed  #30 x 0   Entered by:   Aron Baba CMA ([place])   Authorized by:   [name] DO   Signed by:   Aron Baba CMA ([place]) on 04/09/2010   Method used:   Faxed to ...       [facility] (retail)       [address]       [place], [place]  [address]       Ph: [phone] or [phone]       Fax: [phone]   RxID:   [id number]

## 2010-05-30 NOTE — Consult Note (Signed)
Summary: [facility]   Imported By: Edmonia James 05/16/2010 14:23:22  _____________________________________________________________________  External Attachment:    Type:   Image     Comment:   External Document

## 2010-06-18 ENCOUNTER — Other Ambulatory Visit: Payer: Self-pay | Admitting: Orthopedic Surgery

## 2010-06-18 DIAGNOSIS — M542 Cervicalgia: Secondary | ICD-10-CM

## 2010-06-20 ENCOUNTER — Ambulatory Visit
Admission: RE | Admit: 2010-06-20 | Discharge: 2010-06-20 | Disposition: A | Discharge status: Home / Self Care | Payer: Medicare Other | Source: Ambulatory Visit | Attending: Orthopedic Surgery | Admitting: Orthopedic Surgery

## 2010-06-20 DIAGNOSIS — M542 Cervicalgia: Secondary | ICD-10-CM

## 2010-12-18 ENCOUNTER — Encounter: Payer: Self-pay | Admitting: Family Medicine

## 2010-12-18 ENCOUNTER — Ambulatory Visit (INDEPENDENT_AMBULATORY_CARE_PROVIDER_SITE_OTHER): Payer: Medicare Other | Admitting: Family Medicine

## 2010-12-18 VITALS — BP 108/70 | HR 77 | Temp 98.6°F | Ht 67.0 in | Wt 165.6 lb

## 2010-12-18 DIAGNOSIS — R1013 Epigastric pain: Secondary | ICD-10-CM

## 2010-12-18 DIAGNOSIS — E538 Deficiency of other specified B group vitamins: Secondary | ICD-10-CM

## 2010-12-18 DIAGNOSIS — F329 Major depressive disorder, single episode, unspecified: Secondary | ICD-10-CM

## 2010-12-18 DIAGNOSIS — Z0000 Encounter for general adult medical examination without abnormal findings: Secondary | ICD-10-CM

## 2010-12-18 DIAGNOSIS — R319 Hematuria, unspecified: Secondary | ICD-10-CM

## 2010-12-18 DIAGNOSIS — K3189 Other diseases of stomach and duodenum: Secondary | ICD-10-CM

## 2010-12-18 DIAGNOSIS — F3289 Other specified depressive episodes: Secondary | ICD-10-CM

## 2010-12-18 DIAGNOSIS — N390 Urinary tract infection, site not specified: Secondary | ICD-10-CM

## 2010-12-18 DIAGNOSIS — Z79899 Other long term (current) drug therapy: Secondary | ICD-10-CM

## 2010-12-18 LAB — CBC WITH DIFFERENTIAL/PLATELET
Basophils Relative: 0.6 % (ref 0.0–3.0)
Eosinophils Relative: 1.3 % (ref 0.0–5.0)
Lymphocytes Relative: 23 % (ref 12.0–46.0)
MCV: 94.2 fl (ref 78.0–100.0)
Monocytes Absolute: 0.5 10*3/uL (ref 0.1–1.0)
Monocytes Relative: 5.9 % (ref 3.0–12.0)
Neutrophils Relative %: 69.2 % (ref 43.0–77.0)
Platelets: 236 10*3/uL (ref 150.0–400.0)
RBC: 4.97 Mil/uL (ref 3.87–5.11)
WBC: 9.1 10*3/uL (ref 4.5–10.5)

## 2010-12-18 LAB — BASIC METABOLIC PANEL
CO2: 27 mEq/L (ref 19–32)
Chloride: 102 mEq/L (ref 96–112)
Creatinine, Ser: 0.7 mg/dL (ref 0.4–1.2)
Potassium: 3.9 mEq/L (ref 3.5–5.1)
Sodium: 139 mEq/L (ref 135–145)

## 2010-12-18 LAB — HEPATIC FUNCTION PANEL
AST: 22 U/L (ref 0–37)
Albumin: 4.4 g/dL (ref 3.5–5.2)
Total Bilirubin: 0.8 mg/dL (ref 0.3–1.2)

## 2010-12-18 LAB — LDL CHOLESTEROL, DIRECT: Direct LDL: 118 mg/dL

## 2010-12-18 LAB — POCT URINALYSIS DIPSTICK
Bilirubin, UA: NEGATIVE
Ketones, UA: NEGATIVE
Spec Grav, UA: 1.02
pH, UA: 6

## 2010-12-18 LAB — LIPID PANEL
Cholesterol: 196 mg/dL (ref 0–200)
HDL: 52 mg/dL (ref >39.00)
Total CHOL/HDL Ratio: 4
Triglycerides: 237 mg/dL — ABNORMAL HIGH (ref 0.0–149.0)
VLDL: 47.4 mg/dL — ABNORMAL HIGH (ref 0.0–40.0)

## 2010-12-18 LAB — HEMOGLOBIN A1C: Hgb A1c MFr Bld: 5.5 % (ref 4.6–6.5)

## 2010-12-18 LAB — TSH: TSH: 0.86 u[IU]/mL (ref 0.35–5.50)

## 2010-12-18 MED ORDER — CYANOCOBALAMIN 1000 MCG/ML IJ SOLN: 1000.0000 ug | INTRAMUSCULAR | Status: DC

## 2010-12-18 MED ORDER — ZOSTER VACCINE LIVE 19400 UNT/0.65ML ~~LOC~~ SOLR: 0.6500 mL | Freq: Once | SUBCUTANEOUS | Status: DC

## 2010-12-18 MED ORDER — FLUOXETINE HCL 20 MG PO CAPS: 20.0000 mg | ORAL_CAPSULE | Freq: Every day | ORAL | Status: DC

## 2010-12-18 MED ORDER — OMEPRAZOLE 20 MG PO CPDR: 20.0000 mg | DELAYED_RELEASE_CAPSULE | Freq: Every day | ORAL | Status: DC

## 2010-12-18 NOTE — Progress Notes (Signed)
Addended by: Kristeen Miss on: 12/18/2010 11:58 AM   Modules accepted: Orders

## 2010-12-18 NOTE — Progress Notes (Signed)
Subjective:    Sara Barnes is a 70 y.o. female who presents for Medicare Annual/Subsequent preventive examination.  Preventive Screening-Counseling & Management  Tobacco History  Smoking status  . Never Smoker   Smokeless tobacco  . Never Used     Problems Prior to Visit 1.   Current Problems (verified) Patient Active Problem List  Diagnoses  . 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 MOLE  . VITAMIN B12 DEFICIENCY  . VITAMIN D DEFICIENCY  . HYPERLIPIDEMIA  . DEPRESSION/ANXIETY  . DEPRESSION  . SINUSITIS- ACUTE-NOS  . SINUSITIS, CHRONIC  . HIATAL HERNIA  . IRRITABLE BOWEL SYNDROME  . BREAST MASS, RIGHT  . ARM PAIN, LEFT  . OSTEOPENIA  . CHEST PAIN UNSPECIFIED  . Personal History of Other Diseases of Digestive Disease  . BREAST IMPLANTS, BILATERAL, HX OF  . POSTMENOPAUSAL STATUS    Medications Prior to Visit Current Outpatient Prescriptions on File Prior to Visit  Medication Sig Dispense Refill  . cyanocobalamin (,VITAMIN B-12,) 1000 MCG/ML injection Inject 1 mL (1,000 mcg total) into the muscle every 30 (thirty) days.  10 mL  3  . FLUoxetine (PROZAC) 20 MG capsule Take 1 capsule (20 mg total) by mouth daily.  90 capsule  3    Current Medications (verified) Current Outpatient Prescriptions  Medication Sig Dispense Refill  . cyanocobalamin (,VITAMIN B-12,) 1000 MCG/ML injection Inject 1 mL (1,000 mcg total) into the muscle every 30 (thirty) days.  10 mL  3  . FLUoxetine (PROZAC) 20 MG capsule Take 1 capsule (20 mg total) by mouth daily.  90 capsule  3  . DISCONTD: cyanocobalamin (,VITAMIN B-12,) 1000 MCG/ML injection Inject 1,000 mcg into the muscle every 30 (thirty) days.        Marland Kitchen DISCONTD: FLUoxetine (PROZAC) 20 MG capsule Take 20 mg by mouth daily.        Marland Kitchen omeprazole (PRILOSEC) 20 MG capsule Take 1 capsule (20 mg total) by mouth daily.  90 capsule  1     Allergies (verified) Sulfonamide derivatives and Codeine   PAST HISTORY  Family  History Family History  Problem Relation Age of Onset  . Hypertension    . Prostate cancer Brother 77  . Other Father     Brain Tumor  . Stomach cancer Paternal Grandfather     Gastric cancer    Social History History  Substance Use Topics  . Smoking status: Never Smoker   . Smokeless tobacco: Never Used  . Alcohol Use: Yes     Are there smokers in your home (other than you)? No  Risk Factors Current exercise habits: The patient does not participate in regular exercise at present.  Dietary issues discussed: none Cardiac risk factors: advanced age (older than 25 for men, 27 for women) and sedentary lifestyle.  Depression Screen (Note: if answer to either of the following is "Yes", a more complete depression screening is indicated)   Over the past two weeks, have you felt down, depressed or hopeless? No  Over the past two weeks, have you felt little interest or pleasure in doing things? No  Have you lost interest or pleasure in daily life? No  Do you often feel hopeless? No  Do you cry easily over simple problems? No  Activities of Daily Living In your present state of health, do you have any difficulty performing the following activities?:  Driving? No Managing money?  No Feeding yourself? No Getting from bed to chair? No Climbing a flight  of stairs? No Preparing food and eating?: No Bathing or showering? No Getting dressed: No Getting to the toilet? No Using the toilet:No Moving around from place to place: No In the past year have you fallen or had a near fall?:No   Are you sexually active?  No  Do you have more than one partner?  No  Hearing Difficulties: Yes Do you often ask people to speak up or repeat themselves? Yes Do you experience ringing or noises in your ears? No Do you have difficulty understanding soft or whispered voices? Yes   Do you feel that you have a problem with memory? No  Do you often misplace items? No  Do you feel safe at home?   Yes  Cognitive Testing  Alert? Yes  Normal Appearance?Yes  Oriented to person? Yes  Place? Yes   Time? Yes  Recall of three objects?  Yes  Can perform simple calculations? Yes  Displays appropriate judgment?Yes  Can read the correct time from a watch face?Yes   Advanced Directives have been discussed with the patient? Yes  List the Names of Other Physician/Practitioners you currently use: 1.  Dr Blima Dessert  2.  Dr Christophe Louis  Indicate any recent Medical Services you may have received from other than [facility] providers in the past year (date may be approximate).  Immunization History  Administered Date(s) Administered  . Pneumococcal Polysaccharide 12/10/2007  . Td 06/04/2004, 08/12/2006    Screening Tests Health Maintenance  Topic Date Due  . Colonoscopy  10/28/1991  . Zostavax  10/27/2001  . Influenza Vaccine  01/27/2011  . Mammogram  01/04/2012  . Tetanus/tdap  08/11/2016  . Pneumococcal Polysaccharide Vaccine Age 59 And Over  Completed    All answers were reviewed with the patient and necessary referrals were made:  [name], DO   12/18/2010   History reviewed: allergies, current medications, past family history, past medical history, past social history, past surgical history and problem list  Review of Systems  Review of Systems  Constitutional: Negative for activity change, appetite change and fatigue.  HENT: positive for hearing loss, negative for congestion, tinnitus and ear discharge.  dentist--q53mEyes: Negative for visual disturbance (see optho q1y -- vision corrected to 20/20 with glasses). -sears Respiratory: Negative for cough, chest tightness and shortness of breath.   Cardiovascular: Negative for chest pain, palpitations and leg swelling.  Gastrointestinal: Negative for abdominal pain, diarrhea, constipation and abdominal distention.  Genitourinary: Negative for urgency, frequency, decreased urine volume and difficulty urinating.  Musculoskeletal:  Negative for back pain, arthralgias and gait problem.  Skin: Negative for color change, pallor and rash.  Neurological: Negative for dizziness, light-headedness, numbness and headaches.  Hematological: Negative for adenopathy. Does not bruise/bleed easily.  Psychiatric/Behavioral: Negative for suicidal ideas, confusion, sleep disturbance, self-injury, dysphoric mood, decreased concentration and agitation.  Pt is able to read and write and can do all ADLs No risk for falling No abuse/ violence in home      Objective:     Vision by Snellen chart: right eye:per optho, left eye:  Body mass index is 25.94 kg/(m^2). BP 108/70  Pulse 77  Temp(Src) 98.6 F (37 C) (Oral)  Ht 5' 7"  (1.702 m)  Wt 165 lb 9.6 oz (75.116 kg)  BMI 25.94 kg/m2  SpO2 98%  BP 108/70  Pulse 77  Temp(Src) 98.6 F (37 C) (Oral)  Ht 5' 7"  (1.702 m)  Wt 165 lb 9.6 oz (75.116 kg)  BMI 25.94 kg/m2  SpO2 98% General appearance:  alert, cooperative, appears stated age and no distress Head: Normocephalic, without obvious abnormality, atraumatic Eyes: conjunctivae/corneas clear. PERRL, EOM's intact. Fundi benign. Ears: normal TM's and external ear canals both ears Nose: Nares normal. Septum midline. Mucosa normal. No drainage or sinus tenderness. Throat: lips, mucosa, and tongue normal; teeth and gums normal Neck: no adenopathy, no carotid bruit, no JVD, supple, symmetrical, trachea midline and thyroid not enlarged, symmetric, no tenderness/mass/nodules Back: symmetric, no curvature. ROM normal. No CVA tenderness. Lungs: clear to auscultation bilaterally Breasts: normal appearance, no masses or tenderness Heart: S1, S2 normal Abdomen: soft, non-tender; bowel sounds normal; no masses,  no organomegaly Pelvic: declined by pt Extremities: extremities normal, atraumatic, no cyanosis or edema Pulses: 2+ and symmetric Skin: Skin color, texture, turgor normal. No rashes or lesions Lymph nodes: Cervical, supraclavicular,  and axillary nodes normal. Neurologic: Alert and oriented X 3, normal strength and tone. Normal symmetric reflexes. Normal coordination and gait Psych---no anxiety and depression     Assessment:    preventative visit Hearing loss --Pt may be interstested in seeing someone again just to see if there is anything new for hearing b12 deficiency--check labs Depression--con't meds   Plan:     During the course of the visit the patient was educated and counseled about appropriate screening and preventive services including:    Screening electrocardiogram  Screening mammography  Bone densitometry screening  Colorectal cancer screening  Diabetes screening  Nutrition counseling   Advanced directives: DNI/DNR, power of attorney for healthcare on file, we do not have copies but pt will get it to [place]  Diet review for nutrition referral? Yes ____  Not Indicated ___x_   Patient Instructions (the written plan) was given to the patient.  Medicare Attestation I have personally reviewed: The patient's medical and social history Their use of alcohol, tobacco or illicit drugs Their current medications and supplements The patient's functional ability including ADLs,fall risks, home safety risks, cognitive, and hearing and visual impairment Diet and physical activities Evidence for depression or mood disorders  The patient's weight, height, BMI, and visual acuity have been recorded in the chart.  I have made referrals, counseling, and provided education to the patient based on review of the above and I have provided the patient with a written personalized care plan for preventive services.     [name], DO   12/18/2010

## 2010-12-18 NOTE — Progress Notes (Signed)
Addended by: Ewing Schlein on: 12/18/2010 11:46 AM   Modules accepted: Orders

## 2010-12-18 NOTE — Patient Instructions (Signed)
Preventative Care for Adults - Female Studies show that half of deaths in the [place] today result from unhealthy lifestyle practices. This includes ignoring preventive care suggestions. Preventive health guidelines for women include the following key practices:  A routine yearly physical is a good way to check with your primary caregiver about your health and preventive screening. It is a chance to share any concerns and updates on your health, and to receive a thorough all-over exam.   If you smoke cigarettes, find out from your caregiver how to quit. It can literally save your life, no matter how long you have been a tobacco user. If you do not use tobacco, never start.   Maintain a healthy diet and normal weight. Increased weight leads to problems with blood pressure and diabetes. Decrease saturated fat in your diet and increase regular exercise. Eat a variety of foods, including fruit, vegetables, animal or vegetable protein (meat, fish, chicken, and eggs, or beans, lentils, and tofu), and grains, such as rice. Get information about proper diet from your caregiver, if needed.   Aerobic exercise helps maintain good heart health. The CDC and the [place] recommend 30 minutes of moderate-intensity exercise (a brisk walk that increases your heart rate and breathing) on most days of the week. Ongoing high blood pressure should be treated with medicines, if weight loss and exercise are not effective.   Avoid smoking, drinking too much alcohol (more than two drinks per day), and use of street drugs. Do not share needles with anyone. Ask for professional help if you need support or instructions about stopping the use of alcohol, cigarettes, or drugs.   Maintain normal blood lipids and cholesterol, by minimizing your intake of saturated fat. Eat a well rounded diet, with plenty of fruit and vegetables. The [employer] of Health encourage women to eat 5-9 servings of  fruit and vegetables each day. Your caregiver can give instructions to help you keep your risk of heart disease or stroke low. High blood pressure causes heart disease and increases risk of stroke. Blood pressure should be checked every 1-2 years, from age 84 onward.   Blood tests for high cholesterol, which causes heart and vessel disease, should begin at age 41 and be repeated every 5 years, if test results are normal. (Repeat tests more often if results are high.)   Diabetes screening involves taking a blood sample to check your blood sugar level, after a fasting period. This is done once every 3 years, after age 69, if test results are normal.   Breast cancer screening is essential to preventive care for women. All women age 36 and older should perform a breast self-exam every month. At age 22 and older, women should have their caregiver complete a breast exam each year. Women at ages 56-50 should have a mammogram (x-ray film) of the breasts each year. Your caregiver can discuss when to start your yearly mammograms.   Cervical cancer screening includes taking a Pap smear (sample of cells examined under a microscope) from the cervix (end of the uterus). It also includes testing for HPV (Human Papilloma Virus, which can cause cervical cancer). Screening and a pelvic exam should begin at age 30, or 3 years after a woman becomes sexually active. Screening should occur every year, with a Pap smear but no HPV testing, up to age 47. After age 18, you should have a Pap smear every 3 years with HPV testing, if no HPV was found previously.  Colon cancer can be detected, and often prevented, long before it is life threatening. Most routine colon cancer screening begins at the age of 64. On a yearly basis, your caregiver may provide easy-to-use take-home tests to check for hidden blood in the stool. Use of a small camera at the end of a tube, to directly examine the colon (sigmoidoscopy or colonoscopy), can  detect the earliest forms of colon cancer and can be life saving. Talk to your caregiver about this at age 40, when routine screening begins. (Screening is repeated every 5 years, unless early forms of pre-cancerous polyps or small growths are found.)   Practice safe sex. Use condoms. Condoms are used for birth control and to reduce the spread of sexually transmitted infections (STIs). Unsafe sex is sexual activity without the use of safeguards, such as condoms and avoidance of high-risk acts, to reduce the chances of getting or spreading STIs. STIs include gonorrhea (the clap), chlamydia, syphilis, trichimonas, herpes, HPV (human papilloma virus) and HIV (human immunodeficiency virus), which causes AIDS. Herpes, HIV, and HPV are viral illnesses that have no cure. They can result in disability, cancer, and death.   HPV causes cancer of the cervix, and other infections that can be transmitted from person to person. There is a vaccine for HPV, and females should get immunized between the ages of 16 and 56. It requires a series of 3 shots.   Osteoporosis is a disease in which the bones lose minerals and strength as we age. This can result in serious bone fractures. Risk of osteoporosis can be identified using a bone density scan. Women ages 63 and over should discuss this with their caregivers, as should women after menopause who have other risk factors. Ask your caregiver whether you should be taking a calcium supplement and Vitamin D, to reduce the rate of osteoporosis.   Menopause can be associated with physical symptoms and risks. Hormone replacement therapy is available to decrease these. You should talk to your caregiver about whether starting or continuing to take hormones is right for you.   Use sunscreen with SPF (skin protection factor) of 15 or more. Apply sunscreen liberally and repeatedly throughout the day. Being outside in the sun, when your shadow is shorter than you are, means you are being  exposed to sun at greater intensity. Lighter skinned people are at a greater risk of skin cancer. Wear sunglasses, to protect your eyes from too much damaging sunlight (which can speed up cataract formation).   Once a month, do a whole body skin exam or review, using a mirror to look at your back. Notify your caregiver of changes in moles, especially if there are changes in shapes, colors, irregular border, a size larger than a pencil eraser, or new moles develop.   Keep carbon monoxide and smoke detectors in your home, and functioning, at all times. Change the batteries every 6 months, or use a model that plugs into the wall.   Stay up to date with your tetanus shots and other required immunizations. A booster for tetanus should be given every 10 years. Be sure to get your flu shot every year, since 5%-20% of the [place] population comes down with the flu. The composition of the flu vaccine changes each year, so being vaccinated once is not enough. Get your shot in the fall, before the flu season peaks. The table below lists important vaccines to get. Other vaccines to consider include for Hepatitis A virus (to prevent a form of  infection of the liver, by a virus acquired from food), Varicella Zoster (a virus that causes shingles), and Meninogoccal (against bacteria which cause a form of meningitis).   Brush your teeth twice a day with fluoride toothpaste, and floss once a day. Good oral hygiene prevents tooth decay and gum disease, which can be painful and can cause other health problems. Visit your dentist for a routine oral and dental check up and preventive care every 6-12 months.   The Body Mass Index or BMI is a way of measuring how much of your body is fat. Having a BMI above 27 increases the risk of heart disease, diabetes, hypertension, stroke and other problems related to obesity. Your caregiver can help determine your BMI, and can develop an exercise and dietary program to help you achieve or  maintain this measurement at a healthy level.   Wear seat belts whenever you are in a vehicle, whether as passenger or driver, and even for short drives of a few minutes.   If you bicycle, wear a helmet at all times.  Preventative Care for Adult Women  Preventative Services Ages 91-39 Ages 57-64 Ages 52 and over  Health risk assessment and lifestyle counseling.     Blood pressure check.** Every 1-2 years Every 1-2 years Every 1-2 years  Total cholesterol check including HDL.** Every 5 years beginning at age 61 Every 55 years beginning at age 37, or more often if risk is high Every 5 years through age 18, then optional  Breast self exam. Monthly in all women ages 43 and older Monthly Monthly  Clinical breast exam.** Every 3 years beginning at age 18 Every year Every year  Mammogram.**  Every year beginning at age 49, optional from age 41-49 (discuss with your caregiver). Every year until age 47, then optional  Pap Smear** and HPV Screening. Every year from ages 16 through 69 Every 19 years from ages 23 through 8, if HPV is negative Optional; talk with your caregiver  Flexible sigmoidoscopy** or colonoscopy.**   Every 5 years beginning at age 78 Every 5 years until age 5; then optional  FOBT (fecal occult blood test) of stool.  Every year beginning at age 52 Every year until 76; then optional  Skin self-exam. Monthly Monthly Monthly  Tetanus-diphtheria (Td) immunization. Every 10 years Every 10 years Every 10 years  Influenza immunization.** Every year Every year Every year  HPV immunization. Once between the ages of 23 and 71     Pneumococcal immunization.** Optional Optional Every 5 years  Hepatitis B immunization.** Series of 3 immunizations  (if not done previously, usually given at 0, 1 to 2, and 4 to 6 months)  Check with your caregiver, if vaccination not previously given Check with your caregiver, if vaccination not previously given  ** Family history and personal history of risk and  conditions may change your caregiver's recommendations.  Document Released: 06/10/2001 Document Re-Released: 07/09/2009 [facility] Patient Information 2011 [place].

## 2011-02-03 ENCOUNTER — Other Ambulatory Visit: Payer: Self-pay | Admitting: Family Medicine

## 2011-02-03 DIAGNOSIS — Z1231 Encounter for screening mammogram for malignant neoplasm of breast: Secondary | ICD-10-CM

## 2011-02-20 ENCOUNTER — Ambulatory Visit
Admission: RE | Admit: 2011-02-20 | Discharge: 2011-02-20 | Disposition: A | Discharge status: Home / Self Care | Payer: Medicare Other | Source: Ambulatory Visit | Attending: Family Medicine | Admitting: Family Medicine

## 2011-02-20 DIAGNOSIS — Z1231 Encounter for screening mammogram for malignant neoplasm of breast: Secondary | ICD-10-CM

## 2011-03-13 ENCOUNTER — Other Ambulatory Visit: Payer: Medicare Other

## 2011-03-18 ENCOUNTER — Encounter: Payer: Self-pay | Admitting: Family Medicine

## 2011-03-25 ENCOUNTER — Encounter: Payer: Self-pay | Admitting: Family Medicine

## 2011-03-25 ENCOUNTER — Ambulatory Visit (INDEPENDENT_AMBULATORY_CARE_PROVIDER_SITE_OTHER): Payer: Medicare Other | Admitting: Family Medicine

## 2011-03-25 DIAGNOSIS — G4700 Insomnia, unspecified: Secondary | ICD-10-CM

## 2011-03-25 DIAGNOSIS — R0989 Other specified symptoms and signs involving the circulatory and respiratory systems: Secondary | ICD-10-CM

## 2011-03-25 DIAGNOSIS — R0683 Snoring: Secondary | ICD-10-CM

## 2011-03-25 DIAGNOSIS — E781 Pure hyperglyceridemia: Secondary | ICD-10-CM

## 2011-03-25 LAB — CBC WITH DIFFERENTIAL/PLATELET
Basophils Relative: 0.3 % (ref 0.0–3.0)
Eosinophils Relative: 0.5 % (ref 0.0–5.0)
HCT: 43.6 % (ref 36.0–46.0)
Hemoglobin: 14.9 g/dL (ref 12.0–15.0)
Lymphs Abs: 1.9 10*3/uL (ref 0.7–4.0)
Monocytes Relative: 5 % (ref 3.0–12.0)
Neutro Abs: 8.4 10*3/uL — ABNORMAL HIGH (ref 1.4–7.7)
RBC: 4.61 Mil/uL (ref 3.87–5.11)
RDW: 12.8 % (ref 11.5–14.6)
WBC: 10.9 10*3/uL — ABNORMAL HIGH (ref 4.5–10.5)

## 2011-03-25 LAB — HEPATIC FUNCTION PANEL
ALT: 17 U/L (ref 0–35)
AST: 19 U/L (ref 0–37)
Albumin: 4.1 g/dL (ref 3.5–5.2)
Total Protein: 7.3 g/dL (ref 6.0–8.3)

## 2011-03-25 LAB — BASIC METABOLIC PANEL
CO2: 25 mEq/L (ref 19–32)
Calcium: 9.3 mg/dL (ref 8.4–10.5)
GFR: 88.08 mL/min (ref >60.00)
Glucose, Bld: 75 mg/dL (ref 70–99)
Potassium: 4.1 mEq/L (ref 3.5–5.1)
Sodium: 139 mEq/L (ref 135–145)

## 2011-03-25 LAB — LIPID PANEL: Cholesterol: 196 mg/dL (ref 0–200)

## 2011-03-25 NOTE — Progress Notes (Signed)
  Subjective:    Patient ID: Sara Barnes, female    DOB: 1940-06-02, 70 y.o.   MRN: [id number]  HPI Pt is here c/o not sleeping more than 2 hours at a time.  Pt does snore loudly and her friends wake her up to say she snoring and stops breathing.   Pt also needs TG rechecked.   Review of Systems As above    Objective:   Physical Exam  Constitutional: She appears well-developed and well-nourished.  Cardiovascular: Normal rate, regular rhythm and normal heart sounds.   No murmur heard. Pulmonary/Chest: Effort normal and breath sounds normal. No respiratory distress. She has no wheezes. She has no rales. She exhibits no tenderness.  Psychiatric: She has a normal mood and affect. Her behavior is normal. Judgment and thought content normal.          Assessment & Plan:  Insomnia/ snoring----  Refer to pulm for sleep eval---- probable sleep apnea

## 2011-03-25 NOTE — Patient Instructions (Signed)
Sleep Apnea Sleep apnea is a common disorder. The main problem of this disorder is excessive daytime sleepiness and compromised quality of life. This may include social and emotional problems. There are two types of sleep apnea.  Obstructive sleep apnea is when breathing stops due to a blocked airway.   Central sleep apnea is a malfunction of the brain's normal signal to breathe.  SYMPTOMS  Restless sleep.   Falling asleep while driving and/or during the day.   Loss of energy.   Irritability.   Mood or behavior changes.   Loud, heavy snoring.   Morning headaches.   Trouble concentrating.   Forgetfulness.   Anxiety or depression.   Decreased interest in sex.  Not all people with sleep apnea have all of these symptoms. However, people who have a few of these symptoms should visit their caregiver for an evaluation. Problems related to untreated sleep apnea include:  High blood pressure (hypertension).   Coronary artery disease.   Impotence.   Cognitive dysfunction.   Memory loss.  TREATMENT  For mild cases, treatment may include avoiding sleeping on one's back.   For people with nasal congestion, a decongestant may be prescribed.   Patients with obstructive and central apnea should avoid depressants. This includes alcohol, sedatives and narcotics. Weight loss and diet control are encouraged for overweight patients.   Many serious cases of obstructive sleep apnea can be relieved by a treatment called nasal continuous positive airway pressure (nasal CPAP). Nasal CPAP uses a mask-like device and pump that work together to keep the airway open. The pump delivers air pressure during each breath.   Surgery may help some patients by stopping or reducing the narrowing of the airway due to anatomical defects.  PROGNOSIS  Removing the obstruction usually reverses hypertension and cardiac problems. Untreated, sleep apnea sufferers have a tendency to fall asleep during the day.  This is can result in serious accident or loss of ones job. RESEARCH Sleep apnea is currently one of the most active areas of sleep research.  Document Released: 04/04/2002 Document Revised: 12/25/2010 Document Reviewed: 07/31/2005 [facility] Patient Information 2012 [place].

## 2011-04-03 ENCOUNTER — Encounter: Payer: Self-pay | Admitting: Internal Medicine

## 2011-04-09 ENCOUNTER — Telehealth: Payer: Self-pay | Admitting: Internal Medicine

## 2011-04-09 DIAGNOSIS — R197 Diarrhea, unspecified: Secondary | ICD-10-CM

## 2011-04-09 NOTE — Telephone Encounter (Signed)
Spoke with patient and she will come tomorrow for labs. Labs in [facility]

## 2011-04-09 NOTE — Telephone Encounter (Signed)
Spoke with patient and she reports diarrhea and stomach cramps x 2 weeks. States she gets stomach cramps when she tries to eat and at night. She is c/o diarrhea up to 6-7/day. States she has chills off and on.  She has not taken her temperature.  Denies bleeding or travel out of the country.  She states this episode is not like her "usual ulcerative colitis issues but more like a virus." Hx- ulcerative colitis, last colonoscopy- 02/03/08- active colitis.  Scheduled patient for OV with Dr. Olevia Perches on 04/11/11 at 2:30 PM.

## 2011-04-09 NOTE — Telephone Encounter (Signed)
Please have her come for CBC,C-met, sed rate tomorrow or today before her OV on 04/11/2011

## 2011-04-10 ENCOUNTER — Institutional Professional Consult (permissible substitution): Payer: Medicare Other | Admitting: Pulmonary Disease

## 2011-04-10 ENCOUNTER — Other Ambulatory Visit (INDEPENDENT_AMBULATORY_CARE_PROVIDER_SITE_OTHER): Payer: Medicare Other

## 2011-04-10 ENCOUNTER — Encounter: Payer: Self-pay | Admitting: *Deleted

## 2011-04-10 DIAGNOSIS — R197 Diarrhea, unspecified: Secondary | ICD-10-CM

## 2011-04-10 LAB — CBC WITH DIFFERENTIAL/PLATELET
Basophils Relative: 0.3 % (ref 0.0–3.0)
Eosinophils Absolute: 0.1 10*3/uL (ref 0.0–0.7)
Eosinophils Relative: 0.7 % (ref 0.0–5.0)
Hemoglobin: 14.8 g/dL (ref 12.0–15.0)
Lymphocytes Relative: 14.5 % (ref 12.0–46.0)
MCHC: 33.9 g/dL (ref 30.0–36.0)
MCV: 94.4 fl (ref 78.0–100.0)
Monocytes Absolute: 0.9 10*3/uL (ref 0.1–1.0)
Neutro Abs: 8.5 10*3/uL — ABNORMAL HIGH (ref 1.4–7.7)
RBC: 4.63 Mil/uL (ref 3.87–5.11)

## 2011-04-10 LAB — COMPREHENSIVE METABOLIC PANEL
AST: 15 U/L (ref 0–37)
BUN: 10 mg/dL (ref 6–23)
Calcium: 9.3 mg/dL (ref 8.4–10.5)
Chloride: 102 mEq/L (ref 96–112)
Creatinine, Ser: 0.8 mg/dL (ref 0.4–1.2)
Glucose, Bld: 112 mg/dL — ABNORMAL HIGH (ref 70–99)

## 2011-04-10 LAB — SEDIMENTATION RATE: Sed Rate: 47 mm/hr — ABNORMAL HIGH (ref 0–22)

## 2011-04-11 ENCOUNTER — Encounter: Payer: Self-pay | Admitting: Internal Medicine

## 2011-04-11 ENCOUNTER — Ambulatory Visit (INDEPENDENT_AMBULATORY_CARE_PROVIDER_SITE_OTHER): Payer: Medicare Other | Admitting: Internal Medicine

## 2011-04-11 VITALS — BP 128/84 | HR 96 | Ht 67.0 in | Wt 162.2 lb

## 2011-04-11 DIAGNOSIS — K5100 Ulcerative (chronic) pancolitis without complications: Secondary | ICD-10-CM

## 2011-04-11 MED ORDER — MESALAMINE 800 MG PO TBEC: DELAYED_RELEASE_TABLET | ORAL | Status: DC

## 2011-04-11 MED ORDER — PREDNISONE 10 MG PO TABS: ORAL_TABLET | ORAL | Status: DC

## 2011-04-11 MED ORDER — ALPRAZOLAM 0.5 MG PO TABS: ORAL_TABLET | ORAL | Status: DC

## 2011-04-11 MED ORDER — DICYCLOMINE HCL 10 MG PO CAPS: 10.0000 mg | ORAL_CAPSULE | Freq: Four times a day (QID) | ORAL | Status: DC

## 2011-04-11 NOTE — Patient Instructions (Signed)
We have sent the following medications to your pharmacy for you to pick up at your convenience: Asacol HD Prednisone (take 2 tablets every morning and 1 tablet every night) Bentyl  Xanax 0.5 mg. Take 1/2 tablet every morning and 1 tablet at night. Please call with a condition update for Dr Olevia Perches in 1 week at [phone].  Low Fiber and Residue Restricted Diet A low fiber diet restricts foods that contain carbohydrates that are not digested in the small intestine. A diet containing about 10 g of fiber is considered low fiber. The diet needs to be individualized to suit patient tolerances and preferences and to avoid unnecessary restrictions. Generally, the foods emphasized in a low fiber diet have no skins or seeds. They may have been processed to remove bran, germ, or husks. Cooking may not necessarily eliminate the fiber. Cooking may, in fact, enable a greater quantity of fiber to be consumed in a lesser volume. Legumes and nuts are also restricted. The term low residue has also been used to describe low fiber diets, although the two are not the same. Residue refers to any substance that adds to bowel (colonic) contents, such as sloughed cells and intestinal bacteria, in addition to fiber. Residue-containing foods, prunes and prune juice, milk, and connective tissue from meats may also need to be eliminated. It is important to eliminate these foods during sudden (acute) attacks of inflammatory bowel disease, when there is a partial obstruction due to another reason, or when minimal fecal output is desired. When these problems are gone, a more normal diet may be used. PURPOSE  Prevent blockage of a partially obstructed or narrowed gastrointestinal tract.   Reduce stool weight and volume.   Slow the movement of waste.  WHEN IS THIS DIET USED?  Acute phase of Crohn's disease, ulcerative colitis, regional enteritis, or diverticulitis.   Narrowing (stenosis) of intestinal or esophageal tubes  (lumina).   Transitional diet following surgery, injury (trauma), or illness.  ADEQUACY This diet is nutritionally adequate based on individual food choices according to the Recommended Dietary Allowances of the [employer]. CHOOSING FOODS Check labels, especially on foods from the starch list. Often, dietary fiber content is listed with the Nutrition Facts panel.  Breads and Starches  Allowed: White, [place], and pita breads, plain rolls, buns, or sweet rolls, doughnuts, waffles, pancakes, bagels. Plain muffins, sweet breads, biscuits, matzoth. Flour. Soda, saltine, or graham crackers. Pretzels, rusks, melba toast, zwieback. Cooked cereals: cornmeal, farina, cream cereals. Dry cereals: refined corn, wheat, rice, and oat cereals (check label). Potatoes prepared any way without skins, refined macaroni, spaghetti, noodles, refined rice.   Avoid: Bread, rolls, or crackers made with whole-wheat, multigrains, rye, bran seeds, nuts, or coconut. Corn tortillas, table-shells. Corn chips, tortilla chips. Cereals containing whole-grains, multigrains, bran, coconut, nuts, or raisins. Cooked or dry oatmeal. Coarse wheat cereals, granola. Cereals advertised as "high fiber." Potato skins. Whole-grain pasta, wild or brown rice. Popcorn.  Vegetables  Allowed:  Strained tomato and vegetable juices. Fresh: tender lettuce, cucumber, cabbage, spinach, bean sprouts. Cooked, canned: asparagus, bean sprouts, cut green or wax beans, cauliflower, pumpkin, beets, mushrooms, olives, spinach, yellow squash, tomato, tomato sauce (no seeds), zucchini (peeled), turnips. Canned sweet potatoes. Small amounts of celery, onion, radish, and green pepper may be used. Keep servings limited to  cup.   Avoid: Fresh, cooked, or canned: artichokes, baked beans, beet greens, broccoli, Brussels sprouts, French-style green beans, corn, kale, legumes, peas, sweet potatoes. Cooked: green or red cabbage, spinach. Avoid large  servings  of any vegetables.  Fruit  Allowed:  All fruit juices except prune juice. Cooked or canned: apricots applesauce, cantaloupe, cherries, grapefruit, grapes, kiwi, mandarin oranges, peaches, pears, fruit cocktail, pineapple, plums, watermelon. Fresh: banana, grapes, cantaloupe, avocado, cherries, pineapple, grapefruit, kiwi, nectarines, peaches, oranges, blueberries, plums. Keep servings limited to  cup or 1 piece.   Avoid: Fresh: apple with or without skin, apricots, mango, pears, raspberries, strawberries. Prune juice, stewed or dried prunes. Dried fruits, raisins, dates. Avoid large servings of all fresh fruits.  Meat and Meat Substitutes  Allowed:  Ground or well-cooked tender beef, ham, veal, lamb, pork, or poultry. Eggs, plain cheese. Fish, oysters, shrimp, lobster, other seafood. Liver, organ meats.   Avoid: Tough, fibrous meats with gristle. Peanut butter, smooth or chunky. Cheese with seeds, nuts, or other foods not allowed. Nuts, seeds, legumes, dried peas, beans, lentils.  Milk  Allowed:  All milk products except those not allowed. Milk and milk product consumption should be minimal when low residue is desired.   Avoid: Yogurt that contains nuts or seeds.  Soups and Combination Foods  Allowed:  Bouillon, broth, or cream soups made from allowed foods. Any strained soup. Casseroles or mixed dishes made with allowed foods.   Avoid: Soups made from vegetables that are not allowed or that contain other foods not allowed.  Desserts and Sweets  Allowed:  Plain cakes and cookies, pie made with allowed fruit, pudding, custard, cream pie. Gelatin, fruit, ice, sherbet, frozen ice pops. Ice cream, ice milk without nuts. Plain hard candy, honey, jelly, molasses, syrup, sugar, chocolate syrup, gumdrops, marshmallows.   Avoid: Desserts, cookies, or candies that contain nuts, peanut butter, or dried fruits. Jams, preserves with seeds, marmalade.  Fats and Oils  Allowed:  Margarine, butter,  cream, mayonnaise, salad oils, plain salad dressings made from allowed foods. Plain gravy, crisp bacon without rind.   Avoid: Seeds, nuts, olives. Avocados.  Beverages  Allowed:  All, except those listed to avoid.   Avoid: Fruit juices with high pulp, prune juice.  Condiments  Allowed:  Ketchup, mustard, horseradish, vinegar, cream sauce, cheese sauce, cocoa powder. Spices in moderation: allspice, basil, bay leaves, celery powder or leaves, cinnamon, cumin powder, curry powder, ginger, mace, marjoram, onion or garlic powder, oregano, paprika, parsley flakes, ground pepper, rosemary, sage, savory, tarragon, thyme, turmeric.   Avoid: Coconut, pickles.  SAMPLE MEAL PLAN The following menu is provided as a sample. Your daily menu plans will vary. Be sure to include a minimum of the following each day in order to provide essential nutrients for the adult:  Starch/Bread/Cereal Group, 6 servings.   Fruit/Vegetable Group, 5 servings.   Meat/Meat Substitute Group, 2 servings.   Milk/Milk Substitute Group, 2 servings.  A serving is equal to  cup for fruits, vegetables, and cooked cereals or 1 piece for foods such as a piece of bread, 1 orange, or 1 apple. For dry cereals and crackers, use serving sizes listed on the label. Combination foods may count as full or partial servings from various food groups. Fats, desserts, and sweets may be added to the meal plan after the requirements for essential nutrients are met. SAMPLE MENU Breakfast   cup orange juice.   1 boiled egg.   1 slice white toast.   Margarine.    cup cornflakes.   1 cup milk.   Beverage.  Lunch   cup chicken noodle soup.   2 to 3 oz sliced roast beef.   2 slices seedless rye bread.  Mayonnaise.    cup tomato juice.   1 small banana.   Beverage.  Dinner  3 oz baked chicken.    cup scalloped potatoes.    cup cooked beets.   White dinner roll.   Margarine.    cup canned peaches.    Beverage.  Document Released: 10/04/2001 Document Revised: 12/25/2010 Document Reviewed: 08/28/2008 [facility] Patient Information 2012 [place]. CC: Dr Etter Sjogren

## 2011-04-11 NOTE — Progress Notes (Signed)
Sara Barnes 07-31-1940 MRN [id number]    History of Present Illness:  This is a 70 year old white female with ulcerative colitis diagnosed in 1982. She has been very stable for many years. Her last colonoscopy was in 2006. She has predominantly left-sided ulcerative colitis. She has stopped taking all medications. She is now having a flareup with mucus, frequency, urgency and incontinence. She has nocturnal diarrhea as well. Symptoms have been present for 4 weeks. Her last colonoscopy showed pan colitis. She has a history of low B12. She used to be on Azulfidine. Her ulcerative colitis in the past responded to oral steroids. A done CBC yesterday was normal. A sedimentation rate was elevated at 47.   Past Medical History  Diagnosis Date  . Depression   . Hiatal hernia   . GERD (gastroesophageal reflux disease)   . Vitamin B12 deficiency   . Ulcerative colitis   . 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 Abdominal hysterectomy   . Appendectomy   . Breast surgery     Lumpectomy-Left breast  . Breast enhancement surgery     reports that she has never smoked. She has never used smokeless tobacco. She reports that she drinks alcohol. She reports that she does not use illicit drugs. family history includes Hypertension in an unspecified family member; Other in her father; Prostate cancer (age of onset:50) in her brother; and Stomach cancer in her paternal grandfather. Allergies  Allergen Reactions  . Sulfonamide Derivatives Nausea And Vomiting, Swelling and Rash       . Codeine Nausea And Vomiting        Review of Systems: Denies dysphagia odynophagia, positive for occasional nausea. Decreased level of energy. Denies so chest pain or shortness of breath  The remainder of the 10 point ROS is negative except as outlined in H&P   Physical Exam: General appearance  Well developed, in no distress. Eyes- non icteric. HEENT  nontraumatic, normocephalic. Mouth no lesions, tongue papillated, no cheilosis. Neck supple without adenopathy, thyroid not enlarged, no carotid bruits, no JVD. Lungs Clear to auscultation bilaterally. Cor normal S1, normal S2, rapid  regular rhythm, no murmur,  quiet precordium. Abdomen: Soft nontender with normal active bowel sounds. Minimal tenderness left lower quadrant. No distention. Liver edge at costal margin. Rectal: Yellow Hemoccult-positive stool Extremities no pedal edema. Skin no lesions. Neurological alert and oriented x 3. Psychological normal mood and affect.  Assessment and Plan:  Problem #1 This is a 70 year old white female with an exacerbation of acute ulcerative colitis which was initially diagnosed in 86. She is Hemoccult-positive and very symptomatic. We will start her on prednisone 30 mg daily for the next 3 weeks until I see her in the office. She will also have to take some Xanax 0.5 mg at bedtime and 0.25 mg in the morning. We will start her on Asacol HD 2 tablets twice a day. She will call us with a report in one week. She will also start Bentyl 10 mg when necessary for crampy abdominal pain. She is due for a colonoscopy which will be scheduled after her symptoms are under better control.   04/11/2011 Sara Barnes

## 2011-04-14 ENCOUNTER — Telehealth: Payer: Self-pay | Admitting: Internal Medicine

## 2011-04-14 NOTE — Telephone Encounter (Signed)
Spoke with patient and gave her Dr. Nichola Sizer recommendation.

## 2011-04-14 NOTE — Telephone Encounter (Signed)
Patient calling to report she cannot afford the Asacol. It will cost her $539 for rx. She did not start the samples either since she cannot afford to continue the medication. She is taking the Prednisone and Xanax. She has not taken the Bentyl, stating she did not feel she needed too. Also, wants Dr. Olevia Perches to know she has developed a full blown cold over the weekend. She is having some blood when she blows her nose, chills and a severe headache. Please, advise.

## 2011-04-14 NOTE — Telephone Encounter (Signed)
She ought to takr the samples of Asacal HD until they are finish, they will help to control the flare up.

## 2011-04-15 ENCOUNTER — Emergency Department (HOSPITAL_COMMUNITY): Payer: Medicare Other

## 2011-04-15 ENCOUNTER — Other Ambulatory Visit: Payer: Self-pay

## 2011-04-15 ENCOUNTER — Emergency Department (HOSPITAL_COMMUNITY)
Admission: EM | Admit: 2011-04-15 | Discharge: 2011-04-15 | Disposition: A | Discharge status: Home / Self Care | Payer: Medicare Other | Attending: Emergency Medicine | Admitting: Emergency Medicine

## 2011-04-15 ENCOUNTER — Telehealth: Payer: Self-pay | Admitting: Internal Medicine

## 2011-04-15 DIAGNOSIS — R109 Unspecified abdominal pain: Secondary | ICD-10-CM | POA: Insufficient documentation

## 2011-04-15 DIAGNOSIS — K219 Gastro-esophageal reflux disease without esophagitis: Secondary | ICD-10-CM | POA: Insufficient documentation

## 2011-04-15 DIAGNOSIS — K5190 Ulcerative colitis, unspecified, without complications: Secondary | ICD-10-CM | POA: Insufficient documentation

## 2011-04-15 DIAGNOSIS — F329 Major depressive disorder, single episode, unspecified: Secondary | ICD-10-CM | POA: Insufficient documentation

## 2011-04-15 DIAGNOSIS — R112 Nausea with vomiting, unspecified: Secondary | ICD-10-CM | POA: Insufficient documentation

## 2011-04-15 DIAGNOSIS — R197 Diarrhea, unspecified: Secondary | ICD-10-CM | POA: Insufficient documentation

## 2011-04-15 DIAGNOSIS — F3289 Other specified depressive episodes: Secondary | ICD-10-CM | POA: Insufficient documentation

## 2011-04-15 LAB — CBC
MCH: 31.4 pg (ref 26.0–34.0)
MCHC: 34.3 g/dL (ref 30.0–36.0)
Platelets: 337 10*3/uL (ref 150–400)

## 2011-04-15 LAB — URINE MICROSCOPIC-ADD ON

## 2011-04-15 LAB — URINALYSIS, ROUTINE W REFLEX MICROSCOPIC
Bilirubin Urine: NEGATIVE
Glucose, UA: NEGATIVE mg/dL
Hgb urine dipstick: NEGATIVE
Ketones, ur: NEGATIVE mg/dL
pH: 5.5 (ref 5.0–8.0)

## 2011-04-15 LAB — COMPREHENSIVE METABOLIC PANEL
ALT: 12 U/L (ref 0–35)
AST: 12 U/L (ref 0–37)
Calcium: 9.2 mg/dL (ref 8.4–10.5)
Creatinine, Ser: 0.71 mg/dL (ref 0.50–1.10)
GFR calc Af Amer: >90 mL/min (ref >90)
Glucose, Bld: 114 mg/dL — ABNORMAL HIGH (ref 70–99)
Sodium: 138 mEq/L (ref 135–145)
Total Protein: 6.7 g/dL (ref 6.0–8.3)

## 2011-04-15 LAB — POCT I-STAT TROPONIN I: Troponin i, poc: 0.02 ng/mL (ref 0.00–0.08)

## 2011-04-15 MED ORDER — ONDANSETRON HCL 4 MG/2ML IJ SOLN
4.0000 mg | Freq: Once | INTRAMUSCULAR | Status: AC
  Administered 2011-04-15: 4 mg via INTRAVENOUS
  Filled 2011-04-15: qty 2

## 2011-04-15 MED ORDER — SODIUM CHLORIDE 0.9 % IV BOLUS (SEPSIS)
1000.0000 mL | Freq: Once | INTRAVENOUS | Status: AC
  Administered 2011-04-15: 1000 mL via INTRAVENOUS

## 2011-04-15 MED ORDER — OMEPRAZOLE 20 MG PO CPDR: 40.0000 mg | DELAYED_RELEASE_CAPSULE | Freq: Two times a day (BID) | ORAL | Status: DC

## 2011-04-15 MED ORDER — HYDROMORPHONE HCL PF 1 MG/ML IJ SOLN
1.0000 mg | Freq: Once | INTRAMUSCULAR | Status: DC
  Filled 2011-04-15: qty 1

## 2011-04-15 NOTE — ED Notes (Signed)
Patient denies pain and is resting comfortably.  

## 2011-04-15 NOTE — ED Notes (Signed)
Family at bedside. 

## 2011-04-15 NOTE — ED Notes (Signed)
MD at bedside. 

## 2011-04-15 NOTE — ED Notes (Signed)
Vital signs stable. 

## 2011-04-15 NOTE — Telephone Encounter (Signed)
Spoke with sister and she states the patient called her and states she is not feeling any better. She will have patient call me.

## 2011-04-15 NOTE — ED Notes (Signed)
TXH:FS14<EL> Expected date:04/15/11<BR> Expected time: 3:04 PM<BR> Means of arrival:Ambulance<BR> Comments:<BR> EMS 80 GC - orthostatic/vomiting/diarrhea

## 2011-04-15 NOTE — ED Notes (Signed)
Patient is resting comfortably. 

## 2011-04-15 NOTE — ED Provider Notes (Addendum)
History     CSN: [id number] Arrival date & time: 04/15/2011  3:10 PM   First MD Initiated Contact with Patient 04/15/11 1554      Chief Complaint  Patient presents with  . Nausea    (Consider location/radiation/quality/duration/timing/severity/associated sxs/prior treatment) HPI Patient with a history of inflammatory bowel disease presenting with abdominal pain as well as vomiting and diarrhea. Per chart review she has a history of ulcerative colitis, patient states she has Crohn's disease. She is a patient of Dr. Olevia Perches and states that she was seen last week and started on prednisone as well as mesalamine. Over the past several days her abdominal pain has improved however today vomiting worsened. She's continued to have loose watery bowel movements. She states she has not been able to keep down fluids by mouth. Pain is in her upper abdomen and epigastric region as well as in her left lower abdomen. She denies any fevers but does note having night sweats. Her emesis was nonbloody and nonbilious. She has no urinary complaints. She also complains of lower mid chest wall pain which is sharp in nature and has been constant over the past one week. She has no difficulty breathing.  There are no other alleviating or modifying factors, no associated systemic symptoms  Past Medical History  Diagnosis Date  . Depression   . Hiatal hernia   . GERD (gastroesophageal reflux disease)   . Vitamin B12 deficiency   . Ulcerative colitis   . 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 Abdominal hysterectomy   . Appendectomy   . Breast surgery     Lumpectomy-Left breast  . Breast enhancement surgery     Family History  Problem Relation Age of Onset  . Hypertension    . Prostate cancer Brother 37  . Other Father     Brain Tumor  . Stomach cancer Paternal Grandfather     History  Substance Use Topics  . Smoking status: Never Smoker   .  Smokeless tobacco: Never Used  . Alcohol Use: Yes    OB History    Grav Para Term Preterm Abortions TAB SAB Ect Mult Living                  Review of Systems ROS reviewed and otherwise negative except for mentioned in HPI  Allergies  Sulfonamide derivatives and Codeine  Home Medications   Current Outpatient Rx  Name Route Sig Dispense Refill  . ALPRAZOLAM 0.5 MG PO TABS Oral Take 0.25-0.5 mg by mouth 2 (two) times daily as needed. For anxiety.     Marland Kitchen VITAMIN D3 1000 UNITS PO CAPS Oral Take 1 capsule by mouth daily.     . CYANOCOBALAMIN 1000 MCG/ML IJ SOLN Intramuscular Inject 1 mL (1,000 mcg total) into the muscle every 30 (thirty) days. 10 mL 3  . FLUOXETINE HCL 20 MG PO CAPS Oral Take 1 capsule (20 mg total) by mouth daily. 90 capsule 3  . IBUPROFEN 200 MG PO TABS Oral Take 400 mg by mouth every 6 (six) hours as needed. For pain.     Marland Kitchen MESALAMINE 800 MG PO TBEC  Take 2 tablets by mouth twice daily 120 tablet 2  . OMEPRAZOLE 20 MG PO CPDR Oral Take 1 capsule (20 mg total) by mouth daily. 90 capsule 1  . PREDNISONE 10 MG PO TABS  Take 2 tablets every morning and 1 tablet  at bedtime 100 tablet 0  . OMEPRAZOLE 20 MG PO CPDR Oral Take 2 capsules (40 mg total) by mouth 2 (two) times daily. 60 capsule 0    BP 142/74  Pulse 80  Temp(Src) 97.7 F (36.5 C) (Oral)  Resp 18  SpO2 97% Vitals reviewed Physical Exam Physical Examination: General appearance - alert, concerned appearing, and in no acute distress Mental status - alert, oriented to person, place, and time Eyes - pupils equal and reactive, no scleral icterus or conjunctival injection Mouth - mucous membranes moist, pharynx normal without lesions Chest - clear to auscultation, no wheezes, rales or rhonchi, symmetric air entry Heart - normal rate, regular rhythm, normal S1, S2, no murmurs, rubs, clicks or gallops Abdomen - soft, ttp in epigastrium and LLQ, nondistended, no masses or organomegaly, tenderness is moderate, no  gaurding or rebound Musculoskeletal - no joint tenderness, deformity or swelling Extremities - peripheral pulses normal, no pedal edema, no clubbing or cyanosis Skin - normal coloration and turgor  ED Course  Procedures (including critical care time)   Date: 04/15/2011  Rate: 82  Rhythm: normal sinus rhythm  QRS Axis: normal  Intervals: normal  ST/T Wave abnormalities: normal, q waves in anterior leads  Conduction Disutrbances:none  Narrative Interpretation:   Old EKG Reviewed: no significant changes compared to 04/26/04    Labs Reviewed  URINALYSIS, ROUTINE W REFLEX MICROSCOPIC - Abnormal; Notable for the following:    Leukocytes, UA TRACE (*)    All other components within normal limits  CBC - Abnormal; Notable for the following:    WBC 20.0 (*)    All other components within normal limits  COMPREHENSIVE METABOLIC PANEL - Abnormal; Notable for the following:    Glucose, Bld 114 (*)    Albumin 3.1 (*)    GFR calc non Af Amer 86 (*)    All other components within normal limits  URINE MICROSCOPIC-ADD ON - Abnormal; Notable for the following:    Squamous Epithelial / LPF FEW (*)    Bacteria, UA FEW (*)    All other components within normal limits  LIPASE, BLOOD  POCT I-STAT TROPONIN I  I-STAT TROPONIN I   Ct Abdomen Pelvis W Contrast  04/15/2011  *RADIOLOGY REPORT*  Clinical Data: Abdominal pain.  History of Crohn's disease. Nausea.  Vomiting.  Diarrhea for 4 weeks.  CT ABDOMEN AND PELVIS WITH CONTRAST  Technique:  Multidetector CT imaging of the abdomen and pelvis was performed following the standard protocol during bolus administration of intravenous contrast.  Contrast:  80 ml Omnipaque-300  Comparison: None.  Findings: 3 mm right middle lobe lung nodule on image 3.  Bilateral breast implants. Normal heart size without pericardial or pleural effusion.  A small hiatal hernia.  Normal liver, spleen, distal stomach.  The pancreatic tail and body are within normal limits.   There is edema centered about the descending duodenum and less so the adjacent pancreatic head/pancreatic duodenal groove.  Example images 28 - 34 of series 2.  No pancreatic ductal dilatation. Normal gallbladder, biliary tract, adrenal glands.  Mild bilateral renal cortical thinning.  No retroperitoneal or retrocrural adenopathy.  Portions of the colon are underdistended.  Areas of mild colonic wall thickening are suspected.  Example transverse colon on image 30 and sigmoid colon on image 71.  The terminal ileum is normal on image 57.  Appendix not visualized.  Normal jejunum and ileum, without wall thickening or obstruction.  No ascites, pneumatosis, or free intraperitoneal air.  No pelvic  adenopathy.    Normal urinary bladder.  Hysterectomy.  No adnexal mass.  No significant free fluid.  No acute osseous abnormality.  IMPRESSION:  1.  Inflammation centered about the descending duodenum and adjacent pancreatic head.  Favor duodenitis.  Focal pancreatitis felt less likely.  Consider correlation with pancreatic enzyme levels. 2.  Suspicion of mild colitis, superimposed upon areas of underdistension.  Normal terminal ileum.  This could represent an atypical appearance of Crohn's disease, ulcerative colitis, or infectious colitis. 3.  Tiny right lung base nodule. If the patient is at high risk for bronchogenic carcinoma, follow-up chest CT at 1 year is recommended.  If the patient is at low risk, no follow-up is needed.   This recommendation follows the consensus statement: "Guidelines for Management of Small Pulmonary Nodules Detected on CT Scans:  A Statement from the [place]" as published in Radiology 2005; 237:395-400.  Available online at: https://www.arnold.com/.  Original Report Authenticated By: Areta Haber, M.D.    8:27 PM- discussed results with GI on call for Dr. Olevia Perches.  He suggests that finding of duodenal inflammation most likely represents an ulcer.  Notes  from Dr. Olevia Perches state she has Ulcerative colitis- although pt states she has crohns disease.  No hx of duodenal involvement.  GI recommends increasing PPI (double), no NSAID use and to f/u with GI as an outpatient.  Pt is symptomatically improved.  Will do fluid challenge prior to d/c.  Pt to continue Asacol and prednisone as previously prescribed.      1. Nausea vomiting and diarrhea   2. Ulcerative colitis   3. Abdominal pain       MDM  Patient with history of inflammatory bowel disease presenting with abdominal pain, vomiting, and diarrhea. Her abdominal CT shows findings of inflammation around the descending duodenum as well as mild colitis. These findings as well as patient presentation were discussed with GI. He recommends twice daily Prilosec as well as avoiding NSAIDs. Patient has received fluids antiemetics and pain medication in the ED period she feels much improved and has tolerated oral fluids in the ED tonight. Her white blood cell count is elevated however I believe this is to 2 her being on prednisone. She is nontoxic appearing. She appears well-hydrated upon discharge and is agreeable with the plan for followup. The incidental finding of lung nodule was discussed and it was recommended she have followup chest CT in one year. I do not believe this is related to her current condition. She was given strict return precautions and is agreeable with the plan for discharge.        Threasa Beards, MD 04/15/11 0677  Threasa Beards, MD 04/15/11 [phone]

## 2011-04-15 NOTE — Telephone Encounter (Signed)
Spoke with patient and she states she is so sick she cannot even keep water down. States she is vomiting and having diarrhea. She has not taken her medications today because of vomiting. States she did take Asacol yesterday. She started vomiting last night and is having pressure is the middle of her chest which is new. This started last night and is continuing today.  She is alone at home now. Patient instructed to call 911. Called patient's sister and she is going to patient's home. Dr. Olevia Perches aware. Per Dr. Olevia Perches, tell patient not to take anymore Asacol. Patient notified of this.

## 2011-04-15 NOTE — ED Notes (Signed)
Dizziness, SOB, palpable chest wall pain... Hx of no  Injury....pt on a soft mechanical diet by PCP

## 2011-05-13 ENCOUNTER — Ambulatory Visit (INDEPENDENT_AMBULATORY_CARE_PROVIDER_SITE_OTHER): Payer: MEDICARE | Admitting: Internal Medicine

## 2011-05-13 ENCOUNTER — Encounter: Payer: Self-pay | Admitting: Internal Medicine

## 2011-05-13 DIAGNOSIS — K5190 Ulcerative colitis, unspecified, without complications: Secondary | ICD-10-CM

## 2011-05-13 MED ORDER — PEG-KCL-NACL-NASULF-NA ASC-C 100 G PO SOLR: 1.0000 | Freq: Once | ORAL | Status: DC

## 2011-05-13 NOTE — Progress Notes (Signed)
Sara Barnes [birth year] MRN [id number]   History of Present Illness:  This is a 71 year old white female with ulcerative colitis who started on a prednisone taper after her last visit on 04/11/2011. She has been markedly improved. A CT scan of the abdomen was done in the emergency room after she presented with multiple symptoms felt to be an allergic reaction to mesalamine.Incidental finding of pulmonary nodule was reported in her right lower lung. It measures 3 mm and 1 year follow up has been suggested  Her prior CT scan of the chest in 2006 did not mention presence of the nodule. She has an appointment with Dr.Sood for sleep study. We will ask Dr. Halford Chessman to address the issue of her pulmonary nodule. She currently denies any rectal bleeding diarrhea or abdominal pain. She has not tapered her prednisone yet staying on 30 mg daily..    Past Medical History  Diagnosis Date  . Depression   . Hiatal hernia   . GERD (gastroesophageal reflux disease)   . Vitamin B12 deficiency   . Ulcerative colitis   . 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 Abdominal hysterectomy   . Appendectomy   . Breast surgery     Lumpectomy-Left breast  . Breast enhancement surgery     reports that she has never smoked. She has never used smokeless tobacco. She reports that she drinks alcohol. She reports that she does not use illicit drugs. family history includes Hypertension in an unspecified family member; Other in her father; Prostate cancer (age of onset:50) in her brother; and Stomach cancer in her paternal grandfather. Allergies  Allergen Reactions  . Sulfonamide Derivatives Nausea And Vomiting, Swelling and Rash       . Codeine Nausea And Vomiting  . Mesalamine     questionable        Review of Systems:Denies dysphagia odynophagia admits to being nervous and anxious due to prednisone  The remainder of the 10 point ROS is negative except as  outlined in H&P   Physical Exam: General appearance  Well developed, in no distress. . Neurological alert and oriented x 3. Psychological normal mood and affect.  Assessment and Plan:  Problem #1 Flareup of ulcerative colitis, symptomatically improved on the prednisone taper, but possibly allergic to samples of [place] lHD given to her on her last visit. She will continue to taper her prednisone by 5 mg every 2 weeks. We will schedule a colonoscopy. Her last exam in 2006 showed left sided colitis. She is unable to take mesalamine because of a possible allergy.  Problem #2 Pulmonary nodule. We will ask Dr Halford Chessman to address  This the issue.while she is seeing him for sleep apnea study.I suggested to her that she reschedule her sleep study to when she if off her Prednisone and Xanax.   05/13/2011 [name]

## 2011-05-13 NOTE — Patient Instructions (Addendum)
You have been scheduled for a colonoscopy. Please follow written instructions given to you at your visit today.  Please pick up your prep kit at the pharmacy within the next 2-3 days. Dr Olevia Perches has advised that you be on a prednisone taper. The taper instructions are as follows: Please take Prednisone 20 mg by mouth once daily x 2 weeks (1-30 to 1-29) Prednisone 15 mg by mouth once daily (1-30 to 2/12) Prednisone 10 mg by mouth once daily (2-13 to 2-20) Prednisone 5 mg by mouth once daily (2-21 to 3-7) CC:Dr [name], Dr Clayton Bibles.Sood

## 2011-05-14 ENCOUNTER — Institutional Professional Consult (permissible substitution): Payer: Medicare Other | Admitting: Pulmonary Disease

## 2011-05-14 ENCOUNTER — Encounter: Payer: Self-pay | Admitting: Internal Medicine

## 2011-05-19 ENCOUNTER — Other Ambulatory Visit: Payer: Self-pay | Admitting: Internal Medicine

## 2011-05-19 ENCOUNTER — Telehealth: Payer: Self-pay

## 2011-05-19 ENCOUNTER — Telehealth: Payer: Self-pay | Admitting: Internal Medicine

## 2011-05-19 DIAGNOSIS — K589 Irritable bowel syndrome without diarrhea: Secondary | ICD-10-CM

## 2011-05-19 MED ORDER — METOCLOPRAMIDE HCL 10 MG PO TABS: 10.0000 mg | ORAL_TABLET | Freq: Two times a day (BID) | ORAL | Status: DC | PRN

## 2011-05-19 MED ORDER — POLYETHYLENE GLYCOL 3350 17 GM/SCOOP PO POWD: 255.0000 g | Freq: Once | ORAL | Status: AC

## 2011-05-19 MED ORDER — BISACODYL 5 MG PO TBEC: 10.0000 mg | DELAYED_RELEASE_TABLET | Freq: Two times a day (BID) | ORAL | Status: DC

## 2011-05-19 NOTE — Telephone Encounter (Signed)
Gave brief prep instructions for Miralax prep; will mail instructions as well.  Advised pt to call us if mail doesn't reach her in time for prep.

## 2011-05-20 NOTE — Telephone Encounter (Signed)
Advised patient that we will give her a voucher for a free moviprep. Patient states that she will come to pick this up.

## 2011-05-20 NOTE — Telephone Encounter (Signed)
Patient is requesting a prep for her procedure that her insurance will cover.  She said she called Friday and no one returned her call. Please see if you can help her.

## 2011-05-28 ENCOUNTER — Encounter: Payer: Self-pay | Admitting: Internal Medicine

## 2011-05-28 ENCOUNTER — Ambulatory Visit (AMBULATORY_SURGERY_CENTER): Payer: MEDICARE | Admitting: Internal Medicine

## 2011-05-28 ENCOUNTER — Encounter: Payer: Self-pay | Admitting: *Deleted

## 2011-05-28 VITALS — BP 125/65 | HR 71 | Temp 95.4°F | Resp 16 | Ht 66.0 in | Wt 164.0 lb

## 2011-05-28 DIAGNOSIS — K5190 Ulcerative colitis, unspecified, without complications: Secondary | ICD-10-CM

## 2011-05-28 DIAGNOSIS — K5289 Other specified noninfective gastroenteritis and colitis: Secondary | ICD-10-CM

## 2011-05-28 DIAGNOSIS — K5100 Ulcerative (chronic) pancolitis without complications: Secondary | ICD-10-CM

## 2011-05-28 MED ORDER — SODIUM CHLORIDE 0.9 % IV SOLN: 500.0000 mL | INTRAVENOUS | Status: DC

## 2011-05-28 NOTE — Progress Notes (Signed)
PROPOFOL ADMIN. PER S CAMP CRNA. SEE SCANNED INTRA PROCEDURE REPORT. EWM

## 2011-05-28 NOTE — Op Note (Signed)
[facility] [place], [place]  [id number]  COLONOSCOPY PROCEDURE REPORT  PATIENT:  Sara Barnes, Sara Barnes  MR#:  [id number] BIRTHDATE:  08/26/1940, 70 yrs. old  GENDER:  female ENDOSCOPIST:  Lowella Bandy. Olevia Perches, MD REF. BY:  [name], DO PROCEDURE DATE:  05/28/2011 PROCEDURE:  Colonoscopy with biopsy ASA CLASS:  Class II INDICATIONS:  evaluation of ulcerative colitis UC since 1982,,colon 2006- pancolitis, last colon 2009- pancolitis recent flare up,responded to Prednisone MEDICATIONS:   MAC sedation, administered by CRNA, propofol (Diprivan) 350 mg  DESCRIPTION OF PROCEDURE:   After the risks and benefits and of the procedure were explained, informed consent was obtained. Digital rectal exam was performed and revealed no rectal masses. The LB 180AL [id number] endoscope was introduced through the anus and advanced to the cecum, which was identified by both the appendix and ileocecal valve.  The quality of the prep was good, using MoviPrep.  The instrument was then slowly withdrawn as the colon was fully examined. <<PROCEDUREIMAGES>>  FINDINGS:  Colitis was found. mild erythema 0-60 cm, biopsies taken, severe colitis, aphthous ulcers 70-90 cm with mild stricture mild colitis right colon to 130cm Multiple biopsies were obtained and sent to pathology (see image1, image4, image5, image6, image7, image8, and image11).  A stricture was found at the splenic flexure. mild stricture 70-90 cm edema, fiability, bleeding, Multiple biopsies were obtained and sent to pathology (see image9, image10, image3, and image2).  This was otherwise a normal examination of the colon (see image1).   Retroflexed views in the rectum revealed no abnormalities.    The scope was then withdrawn from the patient and the procedure completed.  COMPLICATIONS:  None ENDOSCOPIC IMPRESSION: 1) Colitis 2) Stricture at the splenic flexure 3) Otherwise normal examination Crohn'd colitis??, mild  stricture RECOMMENDATIONS: 1) Await pathology results consider biologicals follow up OV 3 weeks  REPEAT EXAM:  In 3 year(s) for.  ______________________________ Lowella Bandy. Olevia Perches, MD  CC:  n. eSIGNED:   Lowella Bandy. Brodie at 05/28/2011 10:22 AM  [name], [phone]

## 2011-05-28 NOTE — Patient Instructions (Signed)
Discharge instructions given with verbal understanding. Biopsies taken. Resume previous medications.

## 2011-05-28 NOTE — Progress Notes (Signed)
Patient did not experience any of the following events: a burn prior to discharge; a fall within the facility; wrong site/side/patient/procedure/implant event; or a hospital transfer or hospital admission upon discharge from the facility. (G8907) Patient did not have preoperative order for IV antibiotic SSI prophylaxis. (G8918)  

## 2011-05-29 ENCOUNTER — Telehealth: Payer: Self-pay | Admitting: *Deleted

## 2011-05-29 NOTE — Telephone Encounter (Signed)
  Follow up Call-  Call back number 05/28/2011  Post procedure Call Back phone  # [phone]  Permission to leave phone message Yes     Patient questions:  Do you have a fever, pain , or abdominal swelling? no Pain Score  0 *  Have you tolerated food without any problems? yes  Have you been able to return to your normal activities? yes  Do you have any questions about your discharge instructions: Diet   no Medications  no Follow up visit  no  Do you have questions or concerns about your Care? no  Actions: * If pain score is 4 or above: No action needed, pain <4.

## 2011-05-31 ENCOUNTER — Other Ambulatory Visit: Payer: Self-pay | Admitting: Internal Medicine

## 2011-06-02 ENCOUNTER — Encounter: Payer: Self-pay | Admitting: Internal Medicine

## 2011-06-03 ENCOUNTER — Encounter: Payer: Self-pay | Admitting: *Deleted

## 2011-06-18 ENCOUNTER — Other Ambulatory Visit (INDEPENDENT_AMBULATORY_CARE_PROVIDER_SITE_OTHER): Payer: Medicare Other

## 2011-06-18 ENCOUNTER — Ambulatory Visit (INDEPENDENT_AMBULATORY_CARE_PROVIDER_SITE_OTHER): Payer: MEDICARE | Admitting: Internal Medicine

## 2011-06-18 ENCOUNTER — Encounter: Payer: Self-pay | Admitting: Internal Medicine

## 2011-06-18 VITALS — BP 122/72 | HR 88 | Ht 66.5 in | Wt 168.0 lb

## 2011-06-18 DIAGNOSIS — K5100 Ulcerative (chronic) pancolitis without complications: Secondary | ICD-10-CM

## 2011-06-18 DIAGNOSIS — D849 Immunodeficiency, unspecified: Secondary | ICD-10-CM

## 2011-06-18 LAB — CBC WITH DIFFERENTIAL/PLATELET
Basophils Absolute: 0 10*3/uL (ref 0.0–0.1)
Basophils Relative: 0.3 % (ref 0.0–3.0)
Eosinophils Absolute: 0 10*3/uL (ref 0.0–0.7)
Lymphocytes Relative: 13.5 % (ref 12.0–46.0)
MCHC: 34.5 g/dL (ref 30.0–36.0)
MCV: 93.2 fl (ref 78.0–100.0)
Monocytes Absolute: 0.3 10*3/uL (ref 0.1–1.0)
Neutrophils Relative %: 83.6 % — ABNORMAL HIGH (ref 43.0–77.0)
Platelets: 259 10*3/uL (ref 150.0–400.0)
RBC: 4.47 Mil/uL (ref 3.87–5.11)
RDW: 13.9 % (ref 11.5–14.6)

## 2011-06-18 MED ORDER — AZATHIOPRINE 50 MG PO TABS: ORAL_TABLET | ORAL | Status: DC

## 2011-06-18 NOTE — Patient Instructions (Addendum)
We have sent the following medications to your pharmacy for you to pick up at your convenience: Imuran 100 mg once daily Your physician has requested that you go to the basement for the following lab work before leaving today: CBC, Sedimentation Rate Cc: Dr [name], Dr Halford Chessman

## 2011-06-18 NOTE — Progress Notes (Signed)
Sara Barnes [birth year] MRN [id number]   History of Present Illness:  This is a 71 year old white female with ulcerative colitis of over 40 years duration, starting in 1982. She is status post recent colonoscopy on 05/28/2011 which showed active pancolitis with a mild stricture at the splenic flexure. Biopsies showed friability and colitis in the sigmoid colon. There was active colitis at the splenic flexure and active colitis in the right colon. She has been on a prednisone taper and has been symptomatically improved. She is currently down to 10 mg a day. Patient has been reducing her prednisone on her own because of side effects. Her sedimentation rate was initially 47. She denies rectal bleeding. She is having 2 soft stools a day and denies abdominal pain. She is due to see Dr. Halford Chessman next week for followup of an abnormal CT scan showing a small pulmonary nodule which needs to be followed. She is on Xanax 0.5 mg at bedtime while she is on the prednisone taper.   Past Medical History  Diagnosis Date  . Depression   . Hiatal hernia   . GERD (gastroesophageal reflux disease)   . Vitamin B12 deficiency   . Ulcerative colitis   . 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 Abdominal hysterectomy   . Appendectomy   . Breast surgery     Lumpectomy-Left breast  . Breast enhancement surgery     reports that she has never smoked. She has never used smokeless tobacco. She reports that she drinks alcohol. She reports that she does not use illicit drugs. family history includes Hypertension in an unspecified family member; Other in her father; Prostate cancer (age of onset:50) in her brother; and Stomach cancer in her paternal grandfather. Allergies  Allergen Reactions  . Sulfonamide Derivatives Nausea And Vomiting, Swelling and Rash       . Codeine Nausea And Vomiting  . Mesalamine     questionable        Review of Systems:Denies  dysphagia, odynophagia, abdominal pain or rectal bleeding  The remainder of the 10 point ROS is negative except as outlined in H&P   Physical Exam: General appearance  Well developed, in no distress. Eyes- non icteric. HEENT nontraumatic, normocephalic. Mouth no lesions, tongue papillated, no cheilosis. Neck supple without adenopathy, thyroid not enlarged, no carotid bruits, no JVD. Lungs Clear to auscultation bilaterally. Cor normal S1, normal S2, regular rhythm, no murmur,  quiet precordium. Abdomen:Soft nontender abdomen with normal active bowel sounds no tenderness. Liver edge at costal margin.  Rectal:Not done  Extremities no pedal edema. Skin no lesions. Neurological alert and oriented x 3. Psychological normal mood and affect.  Assessment and Plan:  Problem #1 Ulcerative colitis, pancolitis by recent colonoscopy as well as a mild splenic flexure stricture which was not clinically significant. She is doing well on a prednisone taper. She is tapering her prednisone faster than I suggested. She needs to get on Imuran 100 mg daily. We had a long discussion about the side effects. She is very scared of using it but we don't have any choice because she is allergic to [place]. If she cannot tolerate Imuran, we will have to consider biologicals. We will continue her on prednisone 10 mg for 10 more days then decrease to 5 mg a day for a week then 5 mg every other day. We will recheck her CBC and sedimentation rate today.  Problem #2 Pulmonary  nodule. She is going to see Dr. Halford Chessman next week.   06/18/2011 [name]

## 2011-06-24 ENCOUNTER — Encounter: Payer: Self-pay | Admitting: Pulmonary Disease

## 2011-06-24 ENCOUNTER — Ambulatory Visit (INDEPENDENT_AMBULATORY_CARE_PROVIDER_SITE_OTHER): Payer: Medicare Other | Admitting: Pulmonary Disease

## 2011-06-24 ENCOUNTER — Ambulatory Visit (INDEPENDENT_AMBULATORY_CARE_PROVIDER_SITE_OTHER)
Admission: RE | Admit: 2011-06-24 | Discharge: 2011-06-24 | Disposition: A | Discharge status: Home / Self Care | Payer: Medicare Other | Source: Ambulatory Visit | Attending: Pulmonary Disease | Admitting: Pulmonary Disease

## 2011-06-24 VITALS — BP 134/76 | HR 66 | Temp 97.3°F | Ht 66.5 in | Wt 168.8 lb

## 2011-06-24 DIAGNOSIS — R911 Solitary pulmonary nodule: Secondary | ICD-10-CM | POA: Insufficient documentation

## 2011-06-24 DIAGNOSIS — R0989 Other specified symptoms and signs involving the circulatory and respiratory systems: Secondary | ICD-10-CM

## 2011-06-24 DIAGNOSIS — R0683 Snoring: Secondary | ICD-10-CM

## 2011-06-24 NOTE — Progress Notes (Signed)
Chief Complaint  Patient presents with  . Sleep Consult    requently waking up at night, reports that she was struggling in her sleep and daytime sleepiness.  would like to discuss 03/2011 abd CT findings    History of Present Illness: Sara Barnes is a 71 y.o. female for evaluation of sleep problems and pulmonary nodule.  She was originally referred for evaluation of sleep difficulties.  She was recently started on xanax, and she feels she is sleeping better.  She believes recent use of prednisone has affected her sleep.  She is hopefully she will be weaned off prednisone in the next several weeks.  As a result, she would like to defer further sleep evaluation for the next several months.  She does report snoring, and waking up with a choking sensation.  She has also been told she stops breathing while asleep.  She does feel sleepy during the day, but does not nap.  She goes to bed at 11 pm.  She wakes up at 9 am.  She does not use anything to stay awake.  The patient denies sleep walking, sleep talking, bruxism, or nightmares.  There is no history of restless legs.  The patient denies sleep hallucinations, sleep paralysis, or cataplexy.  Her Epworth score is 0 out of 24.  She was more concerned about recent lung finding while having CT abd/pelvis in December 2012.  She was found to have incidental finding of 3 mm nodule in RML.  This was not readily apparent on CT chest from October, 2006.  She never smoked, but her father and husband were heavy smokers.  She had pneumonia about 5 years ago.  There is no history of TB.  She had trouble with fever and sweats with her recent flare of colitis, but these have improved with therapy.  She denies skin rashes, or gland swelling.  She has not had any respiratory symptoms to speak of.  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  She denies any history of allergies or asthma.  She is originally from [place], and has been living in [place]  for years.  She denies any recent travel.  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   Past Medical History  Diagnosis Date  . Depression   . Hiatal hernia   . GERD (gastroesophageal reflux disease)   . Vitamin B12 deficiency   . Ulcerative colitis   . Benign neoplasm     of skin of trunk  . Pulmonary nodule, right 03/2011    Rt middle lobe 3 mm    Past Surgical History  Procedure Date  . Abdominal hysterectomy   . Appendectomy   . Breast surgery     Lumpectomy-Left breast  . Breast enhancement surgery     Current Outpatient Prescriptions on File Prior to Visit  Medication Sig Dispense Refill  . ALPRAZolam (XANAX) 0.5 MG tablet Take 0.25-0.5 mg by mouth 2 (two) times daily as needed. For anxiety.       Marland Kitchen azaTHIOprine (IMURAN) 50 MG tablet Take 2 tablets by mouth once daily  60 tablet  0  . bisacodyl (DULCOLAX) 5 MG EC tablet Take 2 tablets (10 mg total) by mouth 2 (two) times daily.  4 tablet  0  . cyanocobalamin (,VITAMIN B-12,) 1000 MCG/ML injection Inject 1 mL (1,000 mcg total) into the muscle every 30 (thirty) days.  10 mL  3  . FLUoxetine (PROZAC) 20 MG capsule Take 1  capsule (20 mg total) by mouth daily.  90 capsule  3  . omeprazole (PRILOSEC) 20 MG capsule Take 1 capsule (20 mg total) by mouth daily.  90 capsule  1  . predniSONE (DELTASONE) 10 MG tablet Take as directed  100 tablet  0    Allergies  Allergen Reactions  . Sulfonamide Derivatives Nausea And Vomiting, Swelling and Rash       . Codeine Nausea And Vomiting  . Mesalamine     questionable    family history includes Asthma in her sister; Emphysema in her maternal grandfather; Hypertension in an unspecified family member; Other in her father; Prostate cancer (age of onset:50) in her brother; Rheum arthritis in her brother and mother; and Stomach cancer in her paternal grandfather.   reports that she has never smoked. She has never used smokeless tobacco. She reports that she drinks alcohol. She  reports that she does not use illicit drugs.  Review of Systems Negative except above.  Physical Exam: BP 134/76  Pulse 66  Temp(Src) 97.3 F (36.3 C) (Oral)  Ht 5' 6.5" (1.689 m)  Wt 168 lb 12.8 oz (76.567 kg)  BMI 26.84 kg/m2  SpO2 100% Body mass index is 26.84 kg/(m^2).  General - No distress HEENT - PERRLA, EOMI, no sinus tenderness, no oral exudate, scalloped tongue, MP4, no LAN, no thyromegaly Cardiac - s1s2 regular, no murmur Chest - normal respiratory excursion, no wheeze/rales/dullness Abdomen - soft, nontender, no organomegaly Extremities - no e/c/c Neurologic - normal strength, CN intact Skin - no rashes Psychiatric - anxious  Clinical Data: Dyspnea. History of pneumonia.  CHEST CT WITH CONTRAST 02/14/2005:  Technique: Multidetector CT imaging of the chest was performed following the standard protocol during bolus administration of intravenous contrast.  Contrast: 100 cc Omnipaque 300 IV.  Findings: The heart and great vessels are within normal limits. There is no evidence of aortic dissection or aneurysm. No pleural or pericardial effusions are identified. There are no enlarged lymph nodes present within the axillary, mediastinal, or hilar regions. Subsegmental atelectasis/scarring along the right minor fissure is identified. Mild central peribronchial thickening is present. The lungs are otherwise clear. Bilateral breast prostheses are noted. There is evidence of right breast prosthetic intracapsular rupture.  IMPRESSION:  1. Subsegmental atelectasis/scarring along the right minor fissure.  2. Mild central peribronchial thickening.  3. Bilateral breast prostheses with right prosthetic intracapsular rupture.    *RADIOLOGY REPORT*  Clinical Data: Abdominal pain. History of Crohn's disease.  Nausea. Vomiting. Diarrhea for 4 weeks.  CT ABDOMEN AND PELVIS WITH CONTRAST  Technique: Multidetector CT imaging of the abdomen and pelvis was  performed following the  standard protocol during bolus  administration of intravenous contrast.  Contrast: 80 ml Omnipaque-300  Comparison: None.  Findings: 3 mm right middle lobe lung nodule on image 3. Bilateral  breast implants. Normal heart size without pericardial or pleural  effusion. A small hiatal hernia. Normal liver, spleen, distal  stomach.  The pancreatic tail and body are within normal limits. There is  edema centered about the descending duodenum and less so the  adjacent pancreatic head/pancreatic duodenal groove. Example  images 28 - 34 of series 2. No pancreatic ductal dilatation.  Normal gallbladder, biliary tract, adrenal glands. Mild bilateral  renal cortical thinning.  No retroperitoneal or retrocrural adenopathy.  Portions of the colon are underdistended. Areas of mild colonic  wall thickening are suspected. Example transverse colon on image  9 and sigmoid colon on image 71. The terminal ileum is  normal on  image 57. Appendix not visualized. Normal jejunum and ileum,  without wall thickening or obstruction. No ascites, pneumatosis,  or free intraperitoneal air.  No pelvic adenopathy. Normal urinary bladder. Hysterectomy. No  adnexal mass. No significant free fluid. No acute osseous  abnormality.  IMPRESSION:  1. Inflammation centered about the descending duodenum and  adjacent pancreatic head. Favor duodenitis. Focal pancreatitis  felt less likely. Consider correlation with pancreatic enzyme  levels.  2. Suspicion of mild colitis, superimposed upon areas of  underdistension. Normal terminal ileum. This could represent an  atypical appearance of Crohn's disease, ulcerative colitis, or  infectious colitis.  3. Tiny right lung base nodule. If the patient is at high risk for  bronchogenic carcinoma, follow-up chest CT at 1 year is  recommended. If the patient is at low risk, no follow-up is  needed. This recommendation follows the consensus statement:  "Guidelines for Management of  Small Pulmonary Nodules Detected on  CT Scans: A Statement from the [place]" as published in  Radiology 2005; 237:395-400. Available online at:  https://www.arnold.com/.  Original Report Authenticated By: Areta Haber, M.D.  Assessment/Plan:  Outpatient Encounter Prescriptions as of 06/24/2011  Medication Sig Dispense Refill  . ALPRAZolam (XANAX) 0.5 MG tablet Take 0.25-0.5 mg by mouth 2 (two) times daily as needed. For anxiety.       Marland Kitchen azaTHIOprine (IMURAN) 50 MG tablet Take 2 tablets by mouth once daily  60 tablet  0  . bisacodyl (DULCOLAX) 5 MG EC tablet Take 2 tablets (10 mg total) by mouth 2 (two) times daily.  4 tablet  0  . cyanocobalamin (,VITAMIN B-12,) 1000 MCG/ML injection Inject 1 mL (1,000 mcg total) into the muscle every 30 (thirty) days.  10 mL  3  . FLUoxetine (PROZAC) 20 MG capsule Take 1 capsule (20 mg total) by mouth daily.  90 capsule  3  . omeprazole (PRILOSEC) 20 MG capsule Take 1 capsule (20 mg total) by mouth daily.  90 capsule  1  . predniSONE (DELTASONE) 10 MG tablet Take as directed  100 tablet  0    Emmette Katt Pager:  [phone] 06/27/2011, 10:24 AM

## 2011-06-24 NOTE — Progress Notes (Deleted)
Thanx Vineet, did You get to address the pulm. nodule on her CT?  DB

## 2011-06-24 NOTE — Patient Instructions (Signed)
Chest xray today>>will call with results Follow up in 2 months

## 2011-06-25 ENCOUNTER — Telehealth: Payer: Self-pay | Admitting: Pulmonary Disease

## 2011-06-25 NOTE — Telephone Encounter (Signed)
Called spoke with patient to schedule her 2 month follow up with VS per 2.26.13 ov.  When speaking with patient she requested to know her cxr results today.  Results are in computer but no interpretation for the nurse to relay results to patient.  Dr Halford Chessman please advise, thanks.

## 2011-06-26 NOTE — Telephone Encounter (Signed)
Dg Chest 2 View  06/24/2011  *RADIOLOGY REPORT*  Clinical Data: Pulmonary nodule.  CHEST - 2 VIEW  Comparison: CT chest from 2006.  Findings: The heart is mildly enlarged but stable.  The mediastinal and hilar contours are within normal limits.  The lungs demonstrate chronic changes but no acute pulmonary findings.  No definite pulmonary nodule or mass.  No pleural effusion.  The bony thorax is intact.  IMPRESSION: No acute cardiopulmonary findings. No definite pulmonary lesions.  Original Report Authenticated By: P. Kalman Jewels, M.D.   Please inform patient that chest xray looked okay.  No additional findings compared to CT abd/pelvis from 04/15/11.  Will defer full chest CT for now, and discuss further at her next visit in 2 months.

## 2011-06-26 NOTE — Telephone Encounter (Signed)
Pt is aware of CXR results per Dr. Halford Chessman and will follow-up in 2 months.

## 2011-06-27 ENCOUNTER — Encounter: Payer: Self-pay | Admitting: Pulmonary Disease

## 2011-06-27 DIAGNOSIS — R0683 Snoring: Secondary | ICD-10-CM | POA: Insufficient documentation

## 2011-06-27 NOTE — Assessment & Plan Note (Signed)
She reports snoring, daytime sleepiness, and sleep disruption.  She also has witnessed apnea.  I explained that she likely has sleep apnea.  She does not believe sleep testing at this time would be a true reflection of her sleep pattern, related to current prednisone therapy.  Will have her follow up in 2 months.  Hopefully at that time she will be off prednisone, or at a lower dose, and then can proceed with additional testing.

## 2011-06-27 NOTE — Assessment & Plan Note (Signed)
She has incidental finding of RML small nodule.  She has history of second hand tobacco exposure.  I explained her risk for malignancy is low, but not zero.  Also explained that there may be lesions in her upper lungs not seen on CT abdomen.  She was concerned about additional radiation exposure from repeated CT scanning.  Will therefore have her get a PA/lateral chest xray today.  If this is unremarkable, then advised her she will need f/u CT chest w/o contrast in December 2013 to monitor stability of RML 3 mm nodule.  She was agreeable to this plan.

## 2011-07-30 ENCOUNTER — Other Ambulatory Visit: Payer: Self-pay | Admitting: Internal Medicine

## 2011-07-31 ENCOUNTER — Telehealth: Payer: Self-pay

## 2011-07-31 NOTE — Telephone Encounter (Signed)
Denied refill request per Dr. Olevia Perches. Pt should no longer be on Xanax.

## 2011-07-31 NOTE — Telephone Encounter (Signed)
Message copied by Marzella Schlein on Thu Jul 31, 2011  8:37 AM ------      Message from: Lafayette Dragon      Created: Wed Jul 30, 2011  5:41 PM      Regarding: Xanax refill       Xanax was  only  For anxiety while on Prednisone.      ----- Message -----         From: Larina Bras, CMA         Sent: 07/30/2011  12:45 PM           To: Lafayette Dragon, MD             Dr Olevia Perches-      Per your office note, patient was on xanax while completing a prednisone taper (see note from 06/18/11). It appears she should have been done with the prednisone by now. She requests a refill on xanax. Are you okay with me continuing to fill her medication or does she need to d/c xanax?

## 2011-08-01 ENCOUNTER — Other Ambulatory Visit: Payer: Self-pay | Admitting: Internal Medicine

## 2011-08-19 ENCOUNTER — Ambulatory Visit (INDEPENDENT_AMBULATORY_CARE_PROVIDER_SITE_OTHER): Payer: Medicare Other | Admitting: Pulmonary Disease

## 2011-08-19 ENCOUNTER — Encounter: Payer: Self-pay | Admitting: Pulmonary Disease

## 2011-08-19 VITALS — BP 120/76 | HR 78 | Temp 98.3°F | Ht 66.0 in | Wt 166.6 lb

## 2011-08-19 DIAGNOSIS — R0683 Snoring: Secondary | ICD-10-CM

## 2011-08-19 DIAGNOSIS — R0609 Other forms of dyspnea: Secondary | ICD-10-CM

## 2011-08-19 DIAGNOSIS — R911 Solitary pulmonary nodule: Secondary | ICD-10-CM

## 2011-08-19 NOTE — Assessment & Plan Note (Signed)
Her sleep has improved after she was taken off prednisone.  She would like to monitor her sleep pattern for now.  She will call if she changes her mind about getting a sleep test.

## 2011-08-19 NOTE — Patient Instructions (Signed)
Call if you change your mind about getting sleep test Follow up in December 2013 after CT chest

## 2011-08-19 NOTE — Progress Notes (Signed)
Chief Complaint  Patient presents with  . Follow-up    pt has not had sleep study done yet. pt has no complaints today    History of Present Illness: Sara Barnes is a 71 y.o. female never smoker with pulmonary nodule, and sleep problems.  Since her last visit she has been taken off prednisone.   She does not feel like she is having any trouble with her sleep at present.  She denies cough, wheeze, sputum, fever, sweats, or chest pain.   Past Medical History  Diagnosis Date  . Depression   . Hiatal hernia   . GERD (gastroesophageal reflux disease)   . Vitamin B12 deficiency   . Ulcerative colitis   . Benign neoplasm     of skin of trunk  . Pulmonary nodule, right 03/2011    Rt middle lobe 3 mm    Past Surgical History  Procedure Date  . Abdominal hysterectomy   . Appendectomy   . Breast surgery     Lumpectomy-Left breast  . Breast enhancement surgery     Allergies  Allergen Reactions  . Sulfonamide Derivatives Nausea And Vomiting, Swelling and Rash       . Codeine Nausea And Vomiting  . Mesalamine     questionable    Physical Exam:  Blood pressure 120/76, pulse 78, temperature 98.3 F (36.8 C), temperature source Oral, height 5' 6"  (1.676 m), weight 166 lb 9.6 oz (75.569 kg), SpO2 99.00%. Body mass index is 26.89 kg/(m^2). Wt Readings from Last 2 Encounters:  08/19/11 166 lb 9.6 oz (75.569 kg)  06/24/11 168 lb 12.8 oz (76.567 kg)    General - No distress  HEENT - no sinus tenderness, no oral exudate, scalloped tongue, MP4, no LAN Cardiac - s1s2 regular, no murmur  Chest - normal respiratory excursion, no wheeze/rales/dullness  Abdomen - soft, nontender, no organomegaly  Extremities - no e/c/c  Neurologic - normal strength Skin - no rashes  Psychiatric - anxious   Assessment/Plan:  Outpatient Encounter Prescriptions as of 08/19/2011  Medication Sig Dispense Refill  . bisacodyl (DULCOLAX) 5 MG EC tablet Take 5 mg by mouth daily as needed.      .  cyanocobalamin (,VITAMIN B-12,) 1000 MCG/ML injection Inject 1 mL (1,000 mcg total) into the muscle every 30 (thirty) days.  10 mL  3  . FLUoxetine (PROZAC) 20 MG capsule Take 1 capsule (20 mg total) by mouth daily.  90 capsule  3  . DISCONTD: bisacodyl (DULCOLAX) 5 MG EC tablet Take 2 tablets (10 mg total) by mouth 2 (two) times daily.  4 tablet  0  . DISCONTD: ALPRAZolam (XANAX) 0.5 MG tablet Take 0.25-0.5 mg by mouth 2 (two) times daily as needed. For anxiety.       Marland Kitchen DISCONTD: azaTHIOprine (IMURAN) 50 MG tablet Take 2 tablets by mouth once daily  60 tablet  0  . DISCONTD: omeprazole (PRILOSEC) 20 MG capsule Take 1 capsule (20 mg total) by mouth daily.  90 capsule  1  . DISCONTD: predniSONE (DELTASONE) 10 MG tablet Take as directed  100 tablet  0    Sara Barnes Pager:  [phone] 08/19/2011, 1:25 PM

## 2011-08-19 NOTE — Assessment & Plan Note (Signed)
She will need f/u CT chest w/o contrast for December 2013.

## 2011-09-30 ENCOUNTER — Telehealth: Payer: Self-pay | Admitting: Family Medicine

## 2011-09-30 DIAGNOSIS — G4700 Insomnia, unspecified: Secondary | ICD-10-CM

## 2011-09-30 DIAGNOSIS — E785 Hyperlipidemia, unspecified: Secondary | ICD-10-CM

## 2011-09-30 NOTE — Telephone Encounter (Signed)
Please advise      KP 

## 2011-09-30 NOTE — Telephone Encounter (Signed)
Patient stated she would like the the TSH due to the Insomnia. Ok per [employer]

## 2011-09-30 NOTE — Telephone Encounter (Signed)
i need to know why so we can code it correctly otherwise ins won't pay

## 2011-09-30 NOTE — Telephone Encounter (Signed)
Pt states she is due to have her cholesterol labs and would like her thyroid checked at that time. She is scheduled for 10/06/11. Can this be done?

## 2011-10-06 ENCOUNTER — Other Ambulatory Visit (INDEPENDENT_AMBULATORY_CARE_PROVIDER_SITE_OTHER): Payer: Medicare Other

## 2011-10-06 DIAGNOSIS — G4700 Insomnia, unspecified: Secondary | ICD-10-CM

## 2011-10-06 DIAGNOSIS — E785 Hyperlipidemia, unspecified: Secondary | ICD-10-CM

## 2011-10-06 LAB — HEPATIC FUNCTION PANEL
Albumin: 4 g/dL (ref 3.5–5.2)
Bilirubin, Direct: 0.1 mg/dL (ref 0.0–0.3)
Total Protein: 6.9 g/dL (ref 6.0–8.3)

## 2011-10-06 NOTE — Progress Notes (Signed)
Labs only

## 2011-10-07 LAB — LIPID PANEL
Cholesterol: 190 mg/dL (ref 0–200)
Total CHOL/HDL Ratio: 4
VLDL: 47.6 mg/dL — ABNORMAL HIGH (ref 0.0–40.0)

## 2011-10-07 LAB — LDL CHOLESTEROL, DIRECT: Direct LDL: 113.2 mg/dL

## 2011-10-07 LAB — HEPATIC FUNCTION PANEL
AST: 24 U/L (ref 0–37)
Total Bilirubin: 0.4 mg/dL (ref 0.3–1.2)

## 2011-10-14 ENCOUNTER — Telehealth: Payer: Self-pay | Admitting: Family Medicine

## 2011-10-14 NOTE — Telephone Encounter (Signed)
Caller: Lindee/Patient; PCP: Rosalita Chessman.; CB#: [phone]; ; ; Call regarding Pt concerned about lab results received in mail.  Pt received Hepatic, Bilirubin, Protein and Albumin levels, per Pt, "I was tested for Triglycerides, which I was told was 237".   Pt wants to know why she received the wrong lab results in the mail.  Pt feels she received someone else's results.  Pt states, "this has happened before, doesn't want her insurance to be billed for these results.  Pt wants to discuss if she can wait for her yearly exam for re-testing Triglycerides in August 2013. May leave vmail at number above.

## 2011-10-14 NOTE — Telephone Encounter (Signed)
Copy of labs mailed, Hep panel came in first.     KP

## 2011-10-14 NOTE — Telephone Encounter (Signed)
We always do hep panal with lipid panal-- those are her labs.

## 2011-12-31 ENCOUNTER — Ambulatory Visit (INDEPENDENT_AMBULATORY_CARE_PROVIDER_SITE_OTHER): Payer: Medicare Other | Admitting: Family Medicine

## 2011-12-31 ENCOUNTER — Encounter: Payer: Self-pay | Admitting: Family Medicine

## 2011-12-31 VITALS — BP 118/72 | HR 77 | Temp 97.9°F | Ht 66.5 in | Wt 163.4 lb

## 2011-12-31 DIAGNOSIS — Z0000 Encounter for general adult medical examination without abnormal findings: Secondary | ICD-10-CM

## 2011-12-31 DIAGNOSIS — E781 Pure hyperglyceridemia: Secondary | ICD-10-CM

## 2011-12-31 DIAGNOSIS — Z780 Asymptomatic menopausal state: Secondary | ICD-10-CM

## 2011-12-31 DIAGNOSIS — E559 Vitamin D deficiency, unspecified: Secondary | ICD-10-CM

## 2011-12-31 DIAGNOSIS — E538 Deficiency of other specified B group vitamins: Secondary | ICD-10-CM

## 2011-12-31 DIAGNOSIS — Z1239 Encounter for other screening for malignant neoplasm of breast: Secondary | ICD-10-CM

## 2011-12-31 LAB — BASIC METABOLIC PANEL
GFR: 74.26 mL/min (ref >60.00)
Potassium: 4.1 mEq/L (ref 3.5–5.1)
Sodium: 136 mEq/L (ref 135–145)

## 2011-12-31 LAB — LIPID PANEL
Cholesterol: 199 mg/dL (ref 0–200)
Total CHOL/HDL Ratio: 4
Triglycerides: 266 mg/dL — ABNORMAL HIGH (ref 0.0–149.0)
VLDL: 53.2 mg/dL — ABNORMAL HIGH (ref 0.0–40.0)

## 2011-12-31 LAB — CBC WITH DIFFERENTIAL/PLATELET
Basophils Absolute: 0 10*3/uL (ref 0.0–0.1)
Eosinophils Absolute: 0.1 10*3/uL (ref 0.0–0.7)
HCT: 42.9 % (ref 36.0–46.0)
Lymphs Abs: 1.9 10*3/uL (ref 0.7–4.0)
MCHC: 33.8 g/dL (ref 30.0–36.0)
MCV: 94.4 fl (ref 78.0–100.0)
Monocytes Absolute: 0.5 10*3/uL (ref 0.1–1.0)
Monocytes Relative: 6.3 % (ref 3.0–12.0)
Neutro Abs: 4.8 10*3/uL (ref 1.4–7.7)
Platelets: 223 10*3/uL (ref 150.0–400.0)
RDW: 12.6 % (ref 11.5–14.6)

## 2011-12-31 LAB — POCT URINALYSIS DIPSTICK
Blood, UA: NEGATIVE
Nitrite, UA: NEGATIVE
Spec Grav, UA: 1.02
Urobilinogen, UA: 0.2
pH, UA: 6

## 2011-12-31 LAB — HEPATIC FUNCTION PANEL
Albumin: 3.9 g/dL (ref 3.5–5.2)
Total Bilirubin: 0.7 mg/dL (ref 0.3–1.2)

## 2011-12-31 MED ORDER — CYANOCOBALAMIN 1000 MCG/ML IJ SOLN: 1000.0000 ug | INTRAMUSCULAR | Status: DC

## 2011-12-31 MED ORDER — ZOSTER VACCINE LIVE 19400 UNT/0.65ML ~~LOC~~ SOLR: 0.6500 mL | Freq: Once | SUBCUTANEOUS | Status: DC

## 2011-12-31 NOTE — Patient Instructions (Addendum)
Preventive Care for Adults, Female A healthy lifestyle and preventive care can promote health and wellness. Preventive health guidelines for women include the following key practices.  A routine yearly physical is a good way to check with your caregiver about your health and preventive screening. It is a chance to share any concerns and updates on your health, and to receive a thorough exam.   Visit your dentist for a routine exam and preventive care every 6 months. Brush your teeth twice a day and floss once a day. Good oral hygiene prevents tooth decay and gum disease.   The frequency of eye exams is based on your age, health, family medical history, use of contact lenses, and other factors. Follow your caregiver's recommendations for frequency of eye exams.   Eat a healthy diet. Foods like vegetables, fruits, whole grains, low-fat dairy products, and lean protein foods contain the nutrients you need without too many calories. Decrease your intake of foods high in solid fats, added sugars, and salt. Eat the right amount of calories for you.Get information about a proper diet from your caregiver, if necessary.   Regular physical exercise is one of the most important things you can do for your health. Most adults should get at least 150 minutes of moderate-intensity exercise (any activity that increases your heart rate and causes you to sweat) each week. In addition, most adults need muscle-strengthening exercises on 2 or more days a week.   Maintain a healthy weight. The body mass index (BMI) is a screening tool to identify possible weight problems. It provides an estimate of body fat based on height and weight. Your caregiver can help determine your BMI, and can help you achieve or maintain a healthy weight.For adults 20 years and older:   A BMI below 18.5 is considered underweight.   A BMI of 18.5 to 24.9 is normal.   A BMI of 25 to 29.9 is considered overweight.   A BMI of 30 and above is  considered obese.   Maintain normal blood lipids and cholesterol levels by exercising and minimizing your intake of saturated fat. Eat a balanced diet with plenty of fruit and vegetables. Blood tests for lipids and cholesterol should begin at age 16 and be repeated every 5 years. If your lipid or cholesterol levels are high, you are over 50, or you are at high risk for heart disease, you may need your cholesterol levels checked more frequently.Ongoing high lipid and cholesterol levels should be treated with medicines if diet and exercise are not effective.   If you smoke, find out from your caregiver how to quit. If you do not use tobacco, do not start.   If you are pregnant, do not drink alcohol. If you are breastfeeding, be very cautious about drinking alcohol. If you are not pregnant and choose to drink alcohol, do not exceed 1 drink per day. One drink is considered to be 12 ounces (355 mL) of beer, 5 ounces (148 mL) of wine, or 1.5 ounces (44 mL) of liquor.   Avoid use of street drugs. Do not share needles with anyone. Ask for help if you need support or instructions about stopping the use of drugs.   High blood pressure causes heart disease and increases the risk of stroke. Your blood pressure should be checked at least every 1 to 2 years. Ongoing high blood pressure should be treated with medicines if weight loss and exercise are not effective.   If you are 55 to 70  years old, ask your caregiver if you should take aspirin to prevent strokes.   Diabetes screening involves taking a blood sample to check your fasting blood sugar level. This should be done once every 3 years, after age 55, if you are within normal weight and without risk factors for diabetes. Testing should be considered at a younger age or be carried out more frequently if you are overweight and have at least 1 risk factor for diabetes.   Breast cancer screening is essential preventive care for women. You should practice "breast  self-awareness." This means understanding the normal appearance and feel of your breasts and may include breast self-examination. Any changes detected, no matter how small, should be reported to a caregiver. Women in their 41s and 30s should have a clinical breast exam (CBE) by a caregiver as part of a regular health exam every 1 to 3 years. After age 10, women should have a CBE every year. Starting at age 8, women should consider having a mammography (breast X-ray test) every year. Women who have a family history of breast cancer should talk to their caregiver about genetic screening. Women at a high risk of breast cancer should talk to their caregivers about having magnetic resonance imaging (MRI) and a mammography every year.   The Pap test is a screening test for cervical cancer. A Pap test can show cell changes on the cervix that might become cervical cancer if left untreated. A Pap test is a procedure in which cells are obtained and examined from the lower end of the uterus (cervix).   Women should have a Pap test starting at age 7.   Between ages 7 and 27, Pap tests should be repeated every 2 years.   Beginning at age 58, you should have a Pap test every 3 years as long as the past 3 Pap tests have been normal.   Some women have medical problems that increase the chance of getting cervical cancer. Talk to your caregiver about these problems. It is especially important to talk to your caregiver if a new problem develops soon after your last Pap test. In these cases, your caregiver may recommend more frequent screening and Pap tests.   The above recommendations are the same for women who have or have not gotten the vaccine for human papillomavirus (HPV).   If you had a hysterectomy for a problem that was not cancer or a condition that could lead to cancer, then you no longer need Pap tests. Even if you no longer need a Pap test, a regular exam is a good idea to make sure no other problems are  starting.   If you are between ages 46 and 60, and you have had normal Pap tests going back 10 years, you no longer need Pap tests. Even if you no longer need a Pap test, a regular exam is a good idea to make sure no other problems are starting.   If you have had past treatment for cervical cancer or a condition that could lead to cancer, you need Pap tests and screening for cancer for at least 20 years after your treatment.   If Pap tests have been discontinued, risk factors (such as a new sexual partner) need to be reassessed to determine if screening should be resumed.   The HPV test is an additional test that may be used for cervical cancer screening. The HPV test looks for the virus that can cause the cell changes on the cervix.  The cells collected during the Pap test can be tested for HPV. The HPV test could be used to screen women aged 69 years and older, and should be used in women of any age who have unclear Pap test results. After the age of 1, women should have HPV testing at the same frequency as a Pap test.   Colorectal cancer can be detected and often prevented. Most routine colorectal cancer screening begins at the age of 15 and continues through age 36. However, your caregiver may recommend screening at an earlier age if you have risk factors for colon cancer. On a yearly basis, your caregiver may provide home test kits to check for hidden blood in the stool. Use of a small camera at the end of a tube, to directly examine the colon (sigmoidoscopy or colonoscopy), can detect the earliest forms of colorectal cancer. Talk to your caregiver about this at age 70, when routine screening begins. Direct examination of the colon should be repeated every 5 to 10 years through age 24, unless early forms of pre-cancerous polyps or small growths are found.   Hepatitis C blood testing is recommended for all people born from [birth year] through 1965 and any individual with known risks for hepatitis C.    Practice safe sex. Use condoms and avoid high-risk sexual practices to reduce the spread of sexually transmitted infections (STIs). STIs include gonorrhea, chlamydia, syphilis, trichomonas, herpes, HPV, and human immunodeficiency virus (HIV). Herpes, HIV, and HPV are viral illnesses that have no cure. They can result in disability, cancer, and death. Sexually active women aged 85 and younger should be checked for chlamydia. Older women with new or multiple partners should also be tested for chlamydia. Testing for other STIs is recommended if you are sexually active and at increased risk.   Osteoporosis is a disease in which the bones lose minerals and strength with aging. This can result in serious bone fractures. The risk of osteoporosis can be identified using a bone density scan. Women ages 10 and over and women at risk for fractures or osteoporosis should discuss screening with their caregivers. Ask your caregiver whether you should take a calcium supplement or vitamin D to reduce the rate of osteoporosis.   Menopause can be associated with physical symptoms and risks. Hormone replacement therapy is available to decrease symptoms and risks. You should talk to your caregiver about whether hormone replacement therapy is right for you.   Use sunscreen with sun protection factor (SPF) of 30 or more. Apply sunscreen liberally and repeatedly throughout the day. You should seek shade when your shadow is shorter than you. Protect yourself by wearing long sleeves, pants, a wide-brimmed hat, and sunglasses year round, whenever you are outdoors.   Once a month, do a whole body skin exam, using a mirror to look at the skin on your back. Notify your caregiver of new moles, moles that have irregular borders, moles that are larger than a pencil eraser, or moles that have changed in shape or color.   Stay current with required immunizations.   Influenza. You need a dose every fall (or winter). The composition of  the flu vaccine changes each year, so being vaccinated once is not enough.   Pneumococcal polysaccharide. You need 1 to 2 doses if you smoke cigarettes or if you have certain chronic medical conditions. You need 1 dose at age 37 (or older) if you have never been vaccinated.   Tetanus, diphtheria, pertussis (Tdap, Td). Get 1 dose of  Tdap vaccine if you are younger than age 26, are over 74 and have contact with an infant, are a Dietitian, are pregnant, or simply want to be protected from whooping cough. After that, you need a Td booster dose every 10 years. Consult your caregiver if you have not had at least 3 tetanus and diphtheria-containing shots sometime in your life or have a deep or dirty wound.   HPV. You need this vaccine if you are a woman age 42 or younger. The vaccine is given in 3 doses over 6 months.   Measles, mumps, rubella (MMR). You need at least 1 dose of MMR if you were born in 1957 or later. You may also need a second dose.   Meningococcal. If you are age 24 to 56 and a first-year college student living in a residence hall, or have one of several medical conditions, you need to get vaccinated against meningococcal disease. You may also need additional booster doses.   Zoster (shingles). If you are age 68 or older, you should get this vaccine.   Varicella (chickenpox). If you have never had chickenpox or you were vaccinated but received only 1 dose, talk to your caregiver to find out if you need this vaccine.   Hepatitis A. You need this vaccine if you have a specific risk factor for hepatitis A virus infection or you simply wish to be protected from this disease. The vaccine is usually given as 2 doses, 6 to 18 months apart.   Hepatitis B. You need this vaccine if you have a specific risk factor for hepatitis B virus infection or you simply wish to be protected from this disease. The vaccine is given in 3 doses, usually over 6 months.  Preventive Services /  Frequency Ages 77 to 87  Blood pressure check.** / Every 1 to 2 years.   Lipid and cholesterol check.** / Every 5 years beginning at age 108.   Clinical breast exam.** / Every 3 years for women in their 32s and 26s.   Pap test.** / Every 2 years from ages 20 through 6. Every 3 years starting at age 33 through age 3 or 66 with a history of 3 consecutive normal Pap tests.   HPV screening.** / Every 3 years from ages 45 through ages 44 to 57 with a history of 3 consecutive normal Pap tests.   Hepatitis C blood test.** / For any individual with known risks for hepatitis C.   Skin self-exam. / Monthly.   Influenza immunization.** / Every year.   Pneumococcal polysaccharide immunization.** / 1 to 2 doses if you smoke cigarettes or if you have certain chronic medical conditions.   Tetanus, diphtheria, pertussis (Tdap, Td) immunization. / A one-time dose of Tdap vaccine. After that, you need a Td booster dose every 10 years.   HPV immunization. / 3 doses over 6 months, if you are 76 and younger.   Measles, mumps, rubella (MMR) immunization. / You need at least 1 dose of MMR if you were born in 1957 or later. You may also need a second dose.   Meningococcal immunization. / 1 dose if you are age 39 to 17 and a first-year college student living in a residence hall, or have one of several medical conditions, you need to get vaccinated against meningococcal disease. You may also need additional booster doses.   Varicella immunization.** / Consult your caregiver.   Hepatitis A immunization.** / Consult your caregiver. 2 doses, 6 to 18 months  apart.   Hepatitis B immunization.** / Consult your caregiver. 3 doses usually over 6 months.  Ages 7 to 41  Blood pressure check.** / Every 1 to 2 years.   Lipid and cholesterol check.** / Every 5 years beginning at age 47.   Clinical breast exam.** / Every year after age 51.   Mammogram.** / Every year beginning at age 25 and continuing for as  long as you are in good health. Consult with your caregiver.   Pap test.** / Every 3 years starting at age 45 through age 74 or 109 with a history of 3 consecutive normal Pap tests.   HPV screening.** / Every 3 years from ages 50 through ages 45 to 32 with a history of 3 consecutive normal Pap tests.   Fecal occult blood test (FOBT) of stool. / Every year beginning at age 26 and continuing until age 55. You may not need to do this test if you get a colonoscopy every 10 years.   Flexible sigmoidoscopy or colonoscopy.** / Every 5 years for a flexible sigmoidoscopy or every 10 years for a colonoscopy beginning at age 12 and continuing until age 7.   Hepatitis C blood test.** / For all people born from [birth year] through 1964 and any individual with known risks for hepatitis C.   Skin self-exam. / Monthly.   Influenza immunization.** / Every year.   Pneumococcal polysaccharide immunization.** / 1 to 2 doses if you smoke cigarettes or if you have certain chronic medical conditions.   Tetanus, diphtheria, pertussis (Tdap, Td) immunization.** / A one-time dose of Tdap vaccine. After that, you need a Td booster dose every 10 years.   Measles, mumps, rubella (MMR) immunization. / You need at least 1 dose of MMR if you were born in 1957 or later. You may also need a second dose.   Varicella immunization.** / Consult your caregiver.   Meningococcal immunization.** / Consult your caregiver.   Hepatitis A immunization.** / Consult your caregiver. 2 doses, 6 to 18 months apart.   Hepatitis B immunization.** / Consult your caregiver. 3 doses, usually over 6 months.  Ages 82 and over  Blood pressure check.** / Every 1 to 2 years.   Lipid and cholesterol check.** / Every 5 years beginning at age 76.   Clinical breast exam.** / Every year after age 62.   Mammogram.** / Every year beginning at age 64 and continuing for as long as you are in good health. Consult with your caregiver.   Pap test.** /  Every 3 years starting at age 49 through age 80 or 50 with a 3 consecutive normal Pap tests. Testing can be stopped between 65 and 70 with 3 consecutive normal Pap tests and no abnormal Pap or HPV tests in the past 10 years.   HPV screening.** / Every 3 years from ages 30 through ages 83 or 34 with a history of 3 consecutive normal Pap tests. Testing can be stopped between 65 and 70 with 3 consecutive normal Pap tests and no abnormal Pap or HPV tests in the past 10 years.   Fecal occult blood test (FOBT) of stool. / Every year beginning at age 46 and continuing until age 97. You may not need to do this test if you get a colonoscopy every 10 years.   Flexible sigmoidoscopy or colonoscopy.** / Every 5 years for a flexible sigmoidoscopy or every 10 years for a colonoscopy beginning at age 13 and continuing until age 39.   Hepatitis  C blood test.** / For all people born from [birth year] through 1964 and any individual with known risks for hepatitis C.   Osteoporosis screening.** / A one-time screening for women ages 70 and over and women at risk for fractures or osteoporosis.   Skin self-exam. / Monthly.   Influenza immunization.** / Every year.   Pneumococcal polysaccharide immunization.** / 1 dose at age 62 (or older) if you have never been vaccinated.   Tetanus, diphtheria, pertussis (Tdap, Td) immunization. / A one-time dose of Tdap vaccine if you are over 65 and have contact with an infant, are a Dietitian, or simply want to be protected from whooping cough. After that, you need a Td booster dose every 10 years.   Varicella immunization.** / Consult your caregiver.   Meningococcal immunization.** / Consult your caregiver.   Hepatitis A immunization.** / Consult your caregiver. 2 doses, 6 to 18 months apart.   Hepatitis B immunization.** / Check with your caregiver. 3 doses, usually over 6 months.  ** Family history and personal history of risk and conditions may change your caregiver's  recommendations. Document Released: 06/10/2001 Document Revised: 04/03/2011 Document Reviewed: 09/09/2010 [facility] Patient Information 2012 [place], [place].

## 2011-12-31 NOTE — Progress Notes (Signed)
Subjective:    Sara Barnes is a 71 y.o. female who presents for Medicare Annual/Subsequent preventive examination.  Preventive Screening-Counseling & Management  Tobacco History  Smoking status  . Never Smoker   Smokeless tobacco  . Never Used     Problems Prior to Visit 1.   Current Problems (verified) Patient Active Problem List  Diagnosis  . VITAMIN B12 DEFICIENCY  . VITAMIN D DEFICIENCY  . HYPERLIPIDEMIA  . DEPRESSION/ANXIETY  . SINUSITIS, CHRONIC  . HIATAL HERNIA  . IRRITABLE BOWEL SYNDROME  . BREAST MASS, RIGHT  . ARM PAIN, LEFT  . OSTEOPENIA  . CHEST PAIN UNSPECIFIED  . Personal History of Other Diseases of Digestive Disease  . BREAST IMPLANTS, BILATERAL, HX OF  . POSTMENOPAUSAL STATUS  . Pulmonary nodule  . Snoring    Medications Prior to Visit Current Outpatient Prescriptions on File Prior to Visit  Medication Sig Dispense Refill  . bisacodyl (DULCOLAX) 5 MG EC tablet Take 5 mg by mouth daily as needed.      . cyanocobalamin (,VITAMIN B-12,) 1000 MCG/ML injection Inject 1 mL (1,000 mcg total) into the muscle every 30 (thirty) days.  10 mL  3  . FLUoxetine (PROZAC) 20 MG capsule Take 1 capsule (20 mg total) by mouth daily.  90 capsule  3    Current Medications (verified) Current Outpatient Prescriptions  Medication Sig Dispense Refill  . bisacodyl (DULCOLAX) 5 MG EC tablet Take 5 mg by mouth daily as needed.      . cyanocobalamin (,VITAMIN B-12,) 1000 MCG/ML injection Inject 1 mL (1,000 mcg total) into the muscle every 30 (thirty) days.  10 mL  3  . FLUoxetine (PROZAC) 20 MG capsule Take 1 capsule (20 mg total) by mouth daily.  90 capsule  3  . zoster vaccine live, PF, (ZOSTAVAX) 65784 UNT/0.65ML injection Inject 19,400 Units into the skin once.  1 vial  0     Allergies (verified) Sulfonamide derivatives; Codeine; and Mesalamine   PAST HISTORY  Family History Family History  Problem Relation Age of Onset  . Hypertension    . Prostate cancer  Brother 31  . Other Father     Brain Tumor  . Stomach cancer Paternal Grandfather   . Rheum arthritis Mother   . Rheum arthritis Brother   . Emphysema Maternal Grandfather   . Asthma Sister     Social History History  Substance Use Topics  . Smoking status: Never Smoker   . Smokeless tobacco: Never Used  . Alcohol Use: Yes     Are there smokers in your home (other than you)? No  Risk Factors Current exercise habits: Home exercise routine includes treadmill and walking.  Dietary issues discussed: na   Cardiac risk factors: advanced age (older than 64 for men, 33 for women).  Depression Screen (Note: if answer to either of the following is "Yes", a more complete depression screening is indicated)   Over the past two weeks, have you felt down, depressed or hopeless? No  Over the past two weeks, have you felt little interest or pleasure in doing things? No  Have you lost interest or pleasure in daily life? No  Do you often feel hopeless? No  Do you cry easily over simple problems? No  Activities of Daily Living In your present state of health, do you have any difficulty performing the following activities?:  Driving? No Managing money?  No Feeding yourself? No Getting from bed to chair? No Climbing a flight of stairs?  No Preparing food and eating?: No Bathing or showering? No Getting dressed: No Getting to the toilet? No Using the toilet:No Moving around from place to place: No In the past year have you fallen or had a near fall?:No   Are you sexually active?  No  Do you have more than one partner?  No  Hearing Difficulties: Yes Do you often ask people to speak up or repeat themselves? Yes Do you experience ringing or noises in your ears? Yes Do you have difficulty understanding soft or whispered voices? Yes   Do you feel that you have a problem with memory? No  Do you often misplace items? No  Do you feel safe at home?  Yes  Cognitive Testing  Alert? Yes   Normal Appearance?Yes  Oriented to person? Yes  Place? Yes   Time? Yes  Recall of three objects?  Yes  Can perform simple calculations? Yes  Displays appropriate judgment?Yes  Can read the correct time from a watch face?Yes   Advanced Directives have been discussed with the patient? Yes  List the Names of Other Physician/Practitioners you currently use: 1.  pulm-- sood 2.  Dentist--[facility]ner 3.ophtho-- sears 4.  GI-- brodie  Indicate any recent Medical Services you may have received from other than [facility] providers in the past year (date may be approximate).  Immunization History  Administered Date(s) Administered  . Pneumococcal Polysaccharide 12/10/2007  . Td 06/04/2004, 08/12/2006    Screening Tests Health Maintenance  Topic Date Due  . Zostavax  10/27/2001  . Influenza Vaccine  01/27/2012  . Colonoscopy  05/27/2014  . Tetanus/tdap  08/11/2016  . Pneumococcal Polysaccharide Vaccine Age 81 And Over  Completed    All answers were reviewed with the patient and necessary referrals were made:  [name], DO   12/31/2011   History reviewed: allergies, current medications, past family history, past medical history, past social history, past surgical history and problem list  Review of Systems  Review of Systems  Constitutional: Negative for activity change, appetite change and fatigue.  HENT: Negative for, congestion, tinnitus and ear discharge.  --- + hearing loss  Eyes: Negative for visual disturbance (see optho q1y -- vision corrected to 20/20 with glasses).  Respiratory: Negative for cough, chest tightness and shortness of breath.   Cardiovascular: Negative for chest pain, palpitations and leg swelling.  Gastrointestinal: Negative for abdominal pain, diarrhea, constipation and abdominal distention.  Genitourinary: Negative for urgency, frequency, decreased urine volume and difficulty urinating.  Musculoskeletal: Negative for back pain, arthralgias and gait problem.  Skin:  Negative for color change, pallor and rash.  Neurological: Negative for dizziness, light-headedness, numbness and headaches.  Hematological: Negative for adenopathy. Does not bruise/bleed easily.  Psychiatric/Behavioral: Negative for suicidal ideas, confusion, sleep disturbance, self-injury, dysphoric mood, decreased concentration and agitation.  Pt is able to read and write and can do all ADLs No risk for falling No abuse/ violence in home      Objective:     Vision by Snellen chart: optho  Body mass index is 25.98 kg/(m^2). BP 118/72  Pulse 77  Temp 97.9 F (36.6 C) (Oral)  Ht 5' 6.5" (1.689 m)  Wt 163 lb 6.4 oz (74.118 kg)  BMI 25.98 kg/m2  SpO2 95%  BP 118/72  Pulse 77  Temp 97.9 F (36.6 C) (Oral)  Ht 5' 6.5" (1.689 m)  Wt 163 lb 6.4 oz (74.118 kg)  BMI 25.98 kg/m2  SpO2 95% General appearance: alert, cooperative, appears stated age and no  distress Head: Normocephalic, without obvious abnormality, atraumatic Eyes: conjunctivae/corneas clear. PERRL, EOM's intact. Fundi benign. Ears: normal TM's and external ear canals both ears Nose: Nares normal. Septum midline. Mucosa normal. No drainage or sinus tenderness. Throat: lips, mucosa, and tongue normal; teeth and gums normal Neck: no adenopathy, no carotid bruit, no JVD, supple, symmetrical, trachea midline and thyroid not enlarged, symmetric, no tenderness/mass/nodules Back: symmetric, no curvature. ROM normal. No CVA tenderness. Lungs: clear to auscultation bilaterally Breasts: normal appearance, no masses or tenderness Heart: regular rate and rhythm, S1, S2 normal, no murmur, click, rub or gallop Abdomen: soft, non-tender; bowel sounds normal; no masses,  no organomegaly Pelvic: not indicated; post-menopausal, no abnormal Pap smears in past Extremities: extremities normal, atraumatic, no cyanosis or edema Pulses: 2+ and symmetric Skin: Skin color, texture, turgor normal. No rashes or lesions Lymph nodes: Cervical,  supraclavicular, and axillary nodes normal. Neurologic: Alert and oriented X 3, normal strength and tone. Normal symmetric reflexes. Normal coordination and gait Psych-- no depression, no anxiety     Assessment:     cpe     Plan:     During the course of the visit the patient was educated and counseled about appropriate screening and preventive services including:    Screening mammography  Bone densitometry screening  Colorectal cancer screening  Advanced directives: has an advanced directive - a copy HAS NOT been provided.  Diet review for nutrition referral? Yes ____  Not Indicated __x__   Patient Instructions (the written plan) was given to the patient.  Medicare Attestation I have personally reviewed: The patient's medical and social history Their use of alcohol, tobacco or illicit drugs Their current medications and supplements The patient's functional ability including ADLs,fall risks, home safety risks, cognitive, and hearing and visual impairment Diet and physical activities Evidence for depression or mood disorders  The patient's weight, height, BMI, and visual acuity have been recorded in the chart.  I have made referrals, counseling, and provided education to the patient based on review of the above and I have provided the patient with a written personalized care plan for preventive services.     [name], DO   12/31/2011

## 2012-01-03 LAB — VITAMIN D 1,25 DIHYDROXY: Vitamin D2 1, 25 (OH)2: <8 pg/mL

## 2012-01-08 MED ORDER — ATORVASTATIN CALCIUM 20 MG PO TABS: 20.0000 mg | ORAL_TABLET | Freq: Every day | ORAL | Status: DC

## 2012-02-15 ENCOUNTER — Other Ambulatory Visit: Payer: Self-pay | Admitting: Family Medicine

## 2012-02-23 ENCOUNTER — Other Ambulatory Visit: Payer: Medicare Other

## 2012-02-23 ENCOUNTER — Ambulatory Visit
Admission: RE | Admit: 2012-02-23 | Discharge: 2012-02-23 | Disposition: A | Discharge status: Home / Self Care | Payer: Medicare Other | Source: Ambulatory Visit | Attending: Family Medicine | Admitting: Family Medicine

## 2012-02-23 DIAGNOSIS — Z1239 Encounter for other screening for malignant neoplasm of breast: Secondary | ICD-10-CM

## 2012-04-15 ENCOUNTER — Ambulatory Visit (INDEPENDENT_AMBULATORY_CARE_PROVIDER_SITE_OTHER)
Admission: RE | Admit: 2012-04-15 | Discharge: 2012-04-15 | Disposition: A | Discharge status: Home / Self Care | Payer: Medicare Other | Source: Ambulatory Visit | Attending: Pulmonary Disease | Admitting: Pulmonary Disease

## 2012-04-15 ENCOUNTER — Telehealth: Payer: Self-pay | Admitting: Pulmonary Disease

## 2012-04-15 DIAGNOSIS — R911 Solitary pulmonary nodule: Secondary | ICD-10-CM

## 2012-04-15 NOTE — Telephone Encounter (Signed)
04/15/2012  *RADIOLOGY REPORT*   Clinical Data: Follow up pulmonary nodule   CT CHEST WITHOUT CONTRAST   Technique:  Multidetector CT imaging of the chest was performed following the standard protocol without IV contrast.   Comparison: 06/24/2011   Findings:  Lungs/pleura: Lung bases appear clear. No pleural effusion.  No airspace consolidation noted.  There is a pulmonary nodule in the right middle lobe measure the 4 mm, image 37.  Unchanged from previous exam.  6 mm subpleural nodule in the right base is noted, image 45.  Previously 5 mm.  Heart/Mediastinum: The normal heart size.  No pericardial effusion. No mediastinal or hilar adenopathy.  Upper abdomen: 9 mm nodule in the right adrenal gland is stable from previous exam.  Bones/Musculoskeletal:  Bilateral breast implants noted.  No axillary or supraclavicular adenopathy.  Review of the visualized osseous structures shows no aggressive lytic or sclerotic bone lesions.   IMPRESSION:  1.  The right middle lobe nodule is unchanged from 02/14/2005 and is most likely benign.  Similarly the subpleural nodule in the right base is stable from 02/20/2005.  Also likely benign.    Original Report Authenticated By: Kerby Moors, M.D.     Will have my nurse schedule ROV to review results.

## 2012-04-15 NOTE — Telephone Encounter (Signed)
Pt is scheduled to come in 04/19/12 at 1:30 as she did not want to wait until after the holiday

## 2012-04-19 ENCOUNTER — Encounter: Payer: Self-pay | Admitting: Pulmonary Disease

## 2012-04-19 ENCOUNTER — Ambulatory Visit (INDEPENDENT_AMBULATORY_CARE_PROVIDER_SITE_OTHER): Payer: Medicare Other | Admitting: Pulmonary Disease

## 2012-04-19 VITALS — BP 122/76 | HR 88 | Temp 99.2°F | Ht 66.0 in | Wt 161.4 lb

## 2012-04-19 DIAGNOSIS — R911 Solitary pulmonary nodule: Secondary | ICD-10-CM

## 2012-04-19 DIAGNOSIS — R0609 Other forms of dyspnea: Secondary | ICD-10-CM

## 2012-04-19 DIAGNOSIS — R0683 Snoring: Secondary | ICD-10-CM

## 2012-04-19 DIAGNOSIS — J209 Acute bronchitis, unspecified: Secondary | ICD-10-CM

## 2012-04-19 MED ORDER — AZITHROMYCIN 250 MG PO TABS: ORAL_TABLET | ORAL | Status: DC

## 2012-04-19 NOTE — Progress Notes (Signed)
Chief Complaint  Patient presents with  . Results    pt here to discuss ct results. c/o severe cold. cough w/ green-brown phlem, slight wheezing, chest sore from coughm nasal congestion, facial pressure, chest congestion x 1 week. using ibuprofen and lemon,honey tea    History of Present Illness: Sara Barnes is a 71 y.o. female never smoker with pulmonary nodule, and snoring.  She is here to review recent CT chest.  She developed sinus congestion, and post-nasal drip one week ago.  She has some sinus pressure still.  She has been coughing with brown sputum.  She denies fever or hemoptysis.  She is slowing improving, but is worried about what will happen when she goes to her trip to [place] next week.  She continues to have snoring, sleep disruption, and daytime sleepiness.   TESTS: CT chest 04/15/12>>no change pulmonary nodules since 2006  Past Medical History  Diagnosis Date  . Depression   . Hiatal hernia   . GERD (gastroesophageal reflux disease)   . Vitamin B12 deficiency   . Ulcerative colitis   . Benign neoplasm     of skin of trunk  . Pulmonary nodule, right 03/2011    Rt middle lobe 3 mm    Past Surgical History  Procedure Date  . Abdominal hysterectomy   . Appendectomy   . Breast surgery     Lumpectomy-Left breast  . Breast enhancement surgery     Outpatient Encounter Prescriptions as of 04/19/2012  Medication Sig Dispense Refill  . bisacodyl (DULCOLAX) 5 MG EC tablet Take 5 mg by mouth daily as needed.      . cyanocobalamin (,VITAMIN B-12,) 1000 MCG/ML injection Inject 1 mL (1,000 mcg total) into the muscle every 30 (thirty) days.  10 mL  3  . FLUoxetine (PROZAC) 20 MG capsule TAKE ONE CAPSULE BY MOUTH ONCE DAILY  90 capsule  1  . zoster vaccine live, PF, (ZOSTAVAX) 26333 UNT/0.65ML injection Inject 19,400 Units into the skin once.  1 vial  0  . azithromycin (ZITHROMAX Z-PAK) 250 MG tablet 2 pills on day 1, then 1 pill daily  6 each  0  . [DISCONTINUED]  atorvastatin (LIPITOR) 20 MG tablet Take 1 tablet (20 mg total) by mouth daily.  30 tablet  2  . [DISCONTINUED] azithromycin (ZITHROMAX Z-PAK) 250 MG tablet 2 pills on day 1, then 1 pill daily  6 each  0    Allergies  Allergen Reactions  . Sulfonamide Derivatives Nausea And Vomiting, Swelling and Rash       . Codeine Nausea And Vomiting  . Mesalamine     questionable    Physical Exam:  Filed Vitals:   04/19/12 1333 04/19/12 1334  BP:  122/76  Pulse:  88  Temp: 99.2 F (37.3 C)   TempSrc: Oral   Height: 5' 6"  (1.676 m)   Weight: 161 lb 6.4 oz (73.211 kg)   SpO2:  97%     Current Encounter SPO2  04/19/12 1334 97%  12/31/11 1048 95%  08/19/11 1323 99%     Body mass index is 26.05 kg/(m^2).   Wt Readings from Last 2 Encounters:  04/19/12 161 lb 6.4 oz (73.211 kg)  12/31/11 163 lb 6.4 oz (74.118 kg)     General - No distress ENT - Mild sinus tenderness, clear nasal discharge, TM clear, no oral exudate,scalloped tongue, MP4, no LAN Cardiac - s1s2 regular, no murmur Chest - No wheeze/rales/dullness Back - No focal tenderness Abd - Soft,  non-tender Ext - No edema Neuro - Normal strength Skin - No rashes Psych - normal mood, and behavior   Assessment/Plan:  Chesley Mires, MD [place] [facility]Sleep Pager:  [phone] 04/19/2012, 1:47 PM

## 2012-04-19 NOTE — Assessment & Plan Note (Signed)
Likely started as acute viral illness.  She is concerned about not have therapy during her trip to [place] if her symptoms get worse.  I have given her script for Zpak, but advised her not to take this unless her symptoms get worse.  Advised her to continue with nasal irrigation for her sinuses, and symptomatic therapy.

## 2012-04-19 NOTE — Assessment & Plan Note (Signed)
She will call once she returns from her trip to [place] to schedule sleep study.

## 2012-04-19 NOTE — Patient Instructions (Signed)
Call when you get back from your trip to schedule sleep study

## 2012-04-19 NOTE — Assessment & Plan Note (Signed)
Stable since 2006.  No additional follow up for this needed.

## 2012-05-25 ENCOUNTER — Other Ambulatory Visit (INDEPENDENT_AMBULATORY_CARE_PROVIDER_SITE_OTHER): Payer: Medicare Other

## 2012-05-25 DIAGNOSIS — E785 Hyperlipidemia, unspecified: Secondary | ICD-10-CM

## 2012-05-25 LAB — HEPATIC FUNCTION PANEL
ALT: 16 U/L (ref 0–35)
AST: 20 U/L (ref 0–37)
Total Bilirubin: 0.5 mg/dL (ref 0.3–1.2)
Total Protein: 6.9 g/dL (ref 6.0–8.3)

## 2012-05-25 LAB — LDL CHOLESTEROL, DIRECT: Direct LDL: 99.8 mg/dL

## 2012-07-16 ENCOUNTER — Ambulatory Visit (INDEPENDENT_AMBULATORY_CARE_PROVIDER_SITE_OTHER): Payer: Medicare Other | Admitting: Family Medicine

## 2012-07-16 ENCOUNTER — Encounter: Payer: Self-pay | Admitting: Family Medicine

## 2012-07-16 VITALS — BP 118/92 | HR 80 | Temp 98.7°F | Wt 159.0 lb

## 2012-07-16 DIAGNOSIS — N390 Urinary tract infection, site not specified: Secondary | ICD-10-CM

## 2012-07-16 DIAGNOSIS — R300 Dysuria: Secondary | ICD-10-CM

## 2012-07-16 DIAGNOSIS — R309 Painful micturition, unspecified: Secondary | ICD-10-CM

## 2012-07-16 DIAGNOSIS — R350 Frequency of micturition: Secondary | ICD-10-CM

## 2012-07-16 MED ORDER — CIPROFLOXACIN HCL 500 MG PO TABS: 500.0000 mg | ORAL_TABLET | Freq: Two times a day (BID) | ORAL | Status: AC

## 2012-07-16 NOTE — Progress Notes (Signed)
  Subjective:    Sara Barnes is a 72 y.o. female who complains of dysuria, left flank pain and urgency. She has had symptoms for 2 weeks. Patient also complains of back pain. Patient denies congestion, cough, fever, headache, rhinitis and sorethroat. Patient does not have a history of recurrent UTI. Patient does not have a history of pyelonephritis.   The following portions of the patient's history were reviewed and updated as appropriate: allergies, current medications, past family history, past medical history, past social history, past surgical history and problem list.  Review of Systems Pertinent items are noted in HPI.    Objective:    BP 118/92  Pulse 80  Temp(Src) 98.7 F (37.1 C) (Oral)  Wt 159 lb (72.122 kg)  BMI 25.68 kg/m2  SpO2 94% Back: symmetric, no curvature. ROM normal. No CVA tenderness. Abdomen: suprapubic tenderness  Laboratory:  Urine dipstick: unable to dip secondary to urostat.   Micro exam: not done.    Assessment:    dysuria     Plan:    Medications: ciprofloxacin. Maintain adequate hydration. Follow up if symptoms not improving, and as needed.

## 2012-07-16 NOTE — Patient Instructions (Signed)
Urinary Tract Infection Urinary tract infections (UTIs) can develop anywhere along your urinary tract. Your urinary tract is your body's drainage system for removing wastes and extra water. Your urinary tract includes two kidneys, two ureters, a bladder, and a urethra. Your kidneys are a pair of bean-shaped organs. Each kidney is about the size of your fist. They are located below your ribs, one on each side of your spine. CAUSES Infections are caused by microbes, which are microscopic organisms, including fungi, viruses, and bacteria. These organisms are so small that they can only be seen through a microscope. Bacteria are the microbes that most commonly cause UTIs. SYMPTOMS  Symptoms of UTIs may vary by age and gender of the patient and by the location of the infection. Symptoms in young women typically include a frequent and intense urge to urinate and a painful, burning feeling in the bladder or urethra during urination. Older women and men are more likely to be tired, shaky, and weak and have muscle aches and abdominal pain. A fever may mean the infection is in your kidneys. Other symptoms of a kidney infection include pain in your back or sides below the ribs, nausea, and vomiting. DIAGNOSIS To diagnose a UTI, your caregiver will ask you about your symptoms. Your caregiver also will ask to provide a urine sample. The urine sample will be tested for bacteria and white blood cells. White blood cells are made by your body to help fight infection. TREATMENT  Typically, UTIs can be treated with medication. Because most UTIs are caused by a bacterial infection, they usually can be treated with the use of antibiotics. The choice of antibiotic and length of treatment depend on your symptoms and the type of bacteria causing your infection. HOME CARE INSTRUCTIONS  If you were prescribed antibiotics, take them exactly as your caregiver instructs you. Finish the medication even if you feel better after you  have only taken some of the medication.  Drink enough water and fluids to keep your urine clear or pale yellow.  Avoid caffeine, tea, and carbonated beverages. They tend to irritate your bladder.  Empty your bladder often. Avoid holding urine for long periods of time.  Empty your bladder before and after sexual intercourse.  After a bowel movement, women should cleanse from front to back. Use each tissue only once. SEEK MEDICAL CARE IF:   You have back pain.  You develop a fever.  Your symptoms do not begin to resolve within 3 days. SEEK IMMEDIATE MEDICAL CARE IF:   You have severe back pain or lower abdominal pain.  You develop chills.  You have nausea or vomiting.  You have continued burning or discomfort with urination. MAKE SURE YOU:   Understand these instructions.  Will watch your condition.  Will get help right away if you are not doing well or get worse. Document Released: 01/22/2005 Document Revised: 10/14/2011 Document Reviewed: 05/23/2011 [facility] Patient Information 2013 [place].

## 2012-08-30 ENCOUNTER — Other Ambulatory Visit: Payer: Self-pay | Admitting: Family Medicine

## 2013-01-03 ENCOUNTER — Ambulatory Visit (INDEPENDENT_AMBULATORY_CARE_PROVIDER_SITE_OTHER): Payer: Medicare Other | Admitting: Family Medicine

## 2013-01-03 ENCOUNTER — Encounter: Payer: Self-pay | Admitting: Family Medicine

## 2013-01-03 VITALS — BP 124/78 | HR 97 | Temp 98.4°F | Ht 67.2 in | Wt 158.6 lb

## 2013-01-03 DIAGNOSIS — K589 Irritable bowel syndrome without diarrhea: Secondary | ICD-10-CM

## 2013-01-03 DIAGNOSIS — E785 Hyperlipidemia, unspecified: Secondary | ICD-10-CM

## 2013-01-03 DIAGNOSIS — E538 Deficiency of other specified B group vitamins: Secondary | ICD-10-CM

## 2013-01-03 DIAGNOSIS — Z1239 Encounter for other screening for malignant neoplasm of breast: Secondary | ICD-10-CM

## 2013-01-03 DIAGNOSIS — F32A Depression, unspecified: Secondary | ICD-10-CM

## 2013-01-03 DIAGNOSIS — Z0000 Encounter for general adult medical examination without abnormal findings: Secondary | ICD-10-CM

## 2013-01-03 DIAGNOSIS — K219 Gastro-esophageal reflux disease without esophagitis: Secondary | ICD-10-CM

## 2013-01-03 DIAGNOSIS — F329 Major depressive disorder, single episode, unspecified: Secondary | ICD-10-CM

## 2013-01-03 DIAGNOSIS — F3289 Other specified depressive episodes: Secondary | ICD-10-CM

## 2013-01-03 DIAGNOSIS — E2839 Other primary ovarian failure: Secondary | ICD-10-CM

## 2013-01-03 DIAGNOSIS — K449 Diaphragmatic hernia without obstruction or gangrene: Secondary | ICD-10-CM

## 2013-01-03 LAB — HEPATIC FUNCTION PANEL
ALT: 14 U/L (ref 0–35)
AST: 18 U/L (ref 0–37)
Albumin: 4.1 g/dL (ref 3.5–5.2)

## 2013-01-03 LAB — CBC WITH DIFFERENTIAL/PLATELET
Basophils Absolute: 0 10*3/uL (ref 0.0–0.1)
Eosinophils Relative: 1.5 % (ref 0.0–5.0)
HCT: 42.3 % (ref 36.0–46.0)
Hemoglobin: 14.6 g/dL (ref 12.0–15.0)
Lymphs Abs: 2 10*3/uL (ref 0.7–4.0)
MCV: 92.2 fl (ref 78.0–100.0)
Monocytes Relative: 6.6 % (ref 3.0–12.0)
Neutro Abs: 4.7 10*3/uL (ref 1.4–7.7)
RDW: 12.2 % (ref 11.5–14.6)

## 2013-01-03 LAB — TSH: TSH: 1.24 u[IU]/mL (ref 0.35–5.50)

## 2013-01-03 LAB — BASIC METABOLIC PANEL
Chloride: 104 mEq/L (ref 96–112)
GFR: 84.82 mL/min (ref >60.00)
Glucose, Bld: 97 mg/dL (ref 70–99)
Potassium: 4.2 mEq/L (ref 3.5–5.1)
Sodium: 138 mEq/L (ref 135–145)

## 2013-01-03 LAB — POCT URINALYSIS DIPSTICK
Bilirubin, UA: NEGATIVE
Glucose, UA: NEGATIVE
Leukocytes, UA: NEGATIVE
Nitrite, UA: NEGATIVE

## 2013-01-03 LAB — LIPID PANEL: Cholesterol: 219 mg/dL — ABNORMAL HIGH (ref 0–200)

## 2013-01-03 LAB — VITAMIN B12: Vitamin B-12: 423 pg/mL (ref 211–911)

## 2013-01-03 MED ORDER — CYANOCOBALAMIN 1000 MCG/ML IJ SOLN: 1000.0000 ug | INTRAMUSCULAR | Status: DC

## 2013-01-03 MED ORDER — OMEPRAZOLE 20 MG PO CPDR: DELAYED_RELEASE_CAPSULE | ORAL | Status: DC

## 2013-01-03 MED ORDER — HYOSCYAMINE SULFATE 0.125 MG SL SUBL: 0.1250 mg | SUBLINGUAL_TABLET | SUBLINGUAL | Status: DC | PRN

## 2013-01-03 MED ORDER — FLUOXETINE HCL 20 MG PO CAPS: ORAL_CAPSULE | ORAL | Status: DC

## 2013-01-03 NOTE — Patient Instructions (Signed)
Preventive Care for Adults, Female A healthy lifestyle and preventive care can promote health and wellness. Preventive health guidelines for women include the following key practices.  A routine yearly physical is a good way to check with your caregiver about your health and preventive screening. It is a chance to share any concerns and updates on your health, and to receive a thorough exam.  Visit your dentist for a routine exam and preventive care every 6 months. Brush your teeth twice a day and floss once a day. Good oral hygiene prevents tooth decay and gum disease.  The frequency of eye exams is based on your age, health, family medical history, use of contact lenses, and other factors. Follow your caregiver's recommendations for frequency of eye exams.  Eat a healthy diet. Foods like vegetables, fruits, whole grains, low-fat dairy products, and lean protein foods contain the nutrients you need without too many calories. Decrease your intake of foods high in solid fats, added sugars, and salt. Eat the right amount of calories for you.Get information about a proper diet from your caregiver, if necessary.  Regular physical exercise is one of the most important things you can do for your health. Most adults should get at least 150 minutes of moderate-intensity exercise (any activity that increases your heart rate and causes you to sweat) each week. In addition, most adults need muscle-strengthening exercises on 2 or more days a week.  Maintain a healthy weight. The body mass index (BMI) is a screening tool to identify possible weight problems. It provides an estimate of body fat based on height and weight. Your caregiver can help determine your BMI, and can help you achieve or maintain a healthy weight.For adults 20 years and older:  A BMI below 18.5 is considered underweight.  A BMI of 18.5 to 24.9 is normal.  A BMI of 25 to 29.9 is considered overweight.  A BMI of 30 and above is  considered obese.  Maintain normal blood lipids and cholesterol levels by exercising and minimizing your intake of saturated fat. Eat a balanced diet with plenty of fruit and vegetables. Blood tests for lipids and cholesterol should begin at age 20 and be repeated every 5 years. If your lipid or cholesterol levels are high, you are over 50, or you are at high risk for heart disease, you may need your cholesterol levels checked more frequently.Ongoing high lipid and cholesterol levels should be treated with medicines if diet and exercise are not effective.  If you smoke, find out from your caregiver how to quit. If you do not use tobacco, do not start.  If you are pregnant, do not drink alcohol. If you are breastfeeding, be very cautious about drinking alcohol. If you are not pregnant and choose to drink alcohol, do not exceed 1 drink per day. One drink is considered to be 12 ounces (355 mL) of beer, 5 ounces (148 mL) of wine, or 1.5 ounces (44 mL) of liquor.  Avoid use of street drugs. Do not share needles with anyone. Ask for help if you need support or instructions about stopping the use of drugs.  High blood pressure causes heart disease and increases the risk of stroke. Your blood pressure should be checked at least every 1 to 2 years. Ongoing high blood pressure should be treated with medicines if weight loss and exercise are not effective.  If you are 45 to 71 years old, ask your caregiver if you should take aspirin to prevent strokes.  Diabetes  screening involves taking a blood sample to check your fasting blood sugar level. This should be done once every 3 years, after age 18, if you are within normal weight and without risk factors for diabetes. Testing should be considered at a younger age or be carried out more frequently if you are overweight and have at least 1 risk factor for diabetes.  Breast cancer screening is essential preventive care for women. You should practice "breast  self-awareness." This means understanding the normal appearance and feel of your breasts and may include breast self-examination. Any changes detected, no matter how small, should be reported to a caregiver. Women in their 37s and 30s should have a clinical breast exam (CBE) by a caregiver as part of a regular health exam every 1 to 3 years. After age 87, women should have a CBE every year. Starting at age 65, women should consider having a mammography (breast X-ray test) every year. Women who have a family history of breast cancer should talk to their caregiver about genetic screening. Women at a high risk of breast cancer should talk to their caregivers about having magnetic resonance imaging (MRI) and a mammography every year.  The Pap test is a screening test for cervical cancer. A Pap test can show cell changes on the cervix that might become cervical cancer if left untreated. A Pap test is a procedure in which cells are obtained and examined from the lower end of the uterus (cervix).  Women should have a Pap test starting at age 63.  Between ages 34 and 31, Pap tests should be repeated every 2 years.  Beginning at age 103, you should have a Pap test every 3 years as long as the past 3 Pap tests have been normal.  Some women have medical problems that increase the chance of getting cervical cancer. Talk to your caregiver about these problems. It is especially important to talk to your caregiver if a new problem develops soon after your last Pap test. In these cases, your caregiver may recommend more frequent screening and Pap tests.  The above recommendations are the same for women who have or have not gotten the vaccine for human papillomavirus (HPV).  If you had a hysterectomy for a problem that was not cancer or a condition that could lead to cancer, then you no longer need Pap tests. Even if you no longer need a Pap test, a regular exam is a good idea to make sure no other problems are  starting.  If you are between ages 71 and 51, and you have had normal Pap tests going back 10 years, you no longer need Pap tests. Even if you no longer need a Pap test, a regular exam is a good idea to make sure no other problems are starting.  If you have had past treatment for cervical cancer or a condition that could lead to cancer, you need Pap tests and screening for cancer for at least 20 years after your treatment.  If Pap tests have been discontinued, risk factors (such as a new sexual partner) need to be reassessed to determine if screening should be resumed.  The HPV test is an additional test that may be used for cervical cancer screening. The HPV test looks for the virus that can cause the cell changes on the cervix. The cells collected during the Pap test can be tested for HPV. The HPV test could be used to screen women aged 57 years and older, and should  be used in women of any age who have unclear Pap test results. After the age of 19, women should have HPV testing at the same frequency as a Pap test.  Colorectal cancer can be detected and often prevented. Most routine colorectal cancer screening begins at the age of 59 and continues through age 21. However, your caregiver may recommend screening at an earlier age if you have risk factors for colon cancer. On a yearly basis, your caregiver may provide home test kits to check for hidden blood in the stool. Use of a small camera at the end of a tube, to directly examine the colon (sigmoidoscopy or colonoscopy), can detect the earliest forms of colorectal cancer. Talk to your caregiver about this at age 30, when routine screening begins. Direct examination of the colon should be repeated every 5 to 10 years through age 11, unless early forms of pre-cancerous polyps or small growths are found.  Hepatitis C blood testing is recommended for all people born from [birth year] through 1965 and any individual with known risks for hepatitis C.  Practice  safe sex. Use condoms and avoid high-risk sexual practices to reduce the spread of sexually transmitted infections (STIs). STIs include gonorrhea, chlamydia, syphilis, trichomonas, herpes, HPV, and human immunodeficiency virus (HIV). Herpes, HIV, and HPV are viral illnesses that have no cure. They can result in disability, cancer, and death. Sexually active women aged 37 and younger should be checked for chlamydia. Older women with new or multiple partners should also be tested for chlamydia. Testing for other STIs is recommended if you are sexually active and at increased risk.  Osteoporosis is a disease in which the bones lose minerals and strength with aging. This can result in serious bone fractures. The risk of osteoporosis can be identified using a bone density scan. Women ages 57 and over and women at risk for fractures or osteoporosis should discuss screening with their caregivers. Ask your caregiver whether you should take a calcium supplement or vitamin D to reduce the rate of osteoporosis.  Menopause can be associated with physical symptoms and risks. Hormone replacement therapy is available to decrease symptoms and risks. You should talk to your caregiver about whether hormone replacement therapy is right for you.  Use sunscreen with sun protection factor (SPF) of 30 or more. Apply sunscreen liberally and repeatedly throughout the day. You should seek shade when your shadow is shorter than you. Protect yourself by wearing long sleeves, pants, a wide-brimmed hat, and sunglasses year round, whenever you are outdoors.  Once a month, do a whole body skin exam, using a mirror to look at the skin on your back. Notify your caregiver of new moles, moles that have irregular borders, moles that are larger than a pencil eraser, or moles that have changed in shape or color.  Stay current with required immunizations.  Influenza. You need a dose every fall (or winter). The composition of the flu vaccine  changes each year, so being vaccinated once is not enough.  Pneumococcal polysaccharide. You need 1 to 2 doses if you smoke cigarettes or if you have certain chronic medical conditions. You need 1 dose at age 27 (or older) if you have never been vaccinated.  Tetanus, diphtheria, pertussis (Tdap, Td). Get 1 dose of Tdap vaccine if you are younger than age 51, are over 24 and have contact with an infant, are a Dietitian, are pregnant, or simply want to be protected from whooping cough. After that, you need a Td  booster dose every 10 years. Consult your caregiver if you have not had at least 3 tetanus and diphtheria-containing shots sometime in your life or have a deep or dirty wound.  HPV. You need this vaccine if you are a woman age 53 or younger. The vaccine is given in 3 doses over 6 months.  Measles, mumps, rubella (MMR). You need at least 1 dose of MMR if you were born in 1957 or later. You may also need a second dose.  Meningococcal. If you are age 47 to 61 and a first-year college student living in a residence hall, or have one of several medical conditions, you need to get vaccinated against meningococcal disease. You may also need additional booster doses.  Zoster (shingles). If you are age 18 or older, you should get this vaccine.  Varicella (chickenpox). If you have never had chickenpox or you were vaccinated but received only 1 dose, talk to your caregiver to find out if you need this vaccine.  Hepatitis A. You need this vaccine if you have a specific risk factor for hepatitis A virus infection or you simply wish to be protected from this disease. The vaccine is usually given as 2 doses, 6 to 18 months apart.  Hepatitis B. You need this vaccine if you have a specific risk factor for hepatitis B virus infection or you simply wish to be protected from this disease. The vaccine is given in 3 doses, usually over 6 months. Preventive Services / Frequency Ages 32 to 29  Blood  pressure check.** / Every 1 to 2 years.  Lipid and cholesterol check.** / Every 5 years beginning at age 45.  Clinical breast exam.** / Every 3 years for women in their 31s and 74s.  Pap test.** / Every 2 years from ages 67 through 78. Every 3 years starting at age 50 through age 67 or 30 with a history of 3 consecutive normal Pap tests.  HPV screening.** / Every 3 years from ages 59 through ages 41 to 95 with a history of 3 consecutive normal Pap tests.  Hepatitis C blood test.** / For any individual with known risks for hepatitis C.  Skin self-exam. / Monthly.  Influenza immunization.** / Every year.  Pneumococcal polysaccharide immunization.** / 1 to 2 doses if you smoke cigarettes or if you have certain chronic medical conditions.  Tetanus, diphtheria, pertussis (Tdap, Td) immunization. / A one-time dose of Tdap vaccine. After that, you need a Td booster dose every 10 years.  HPV immunization. / 3 doses over 6 months, if you are 52 and younger.  Measles, mumps, rubella (MMR) immunization. / You need at least 1 dose of MMR if you were born in 1957 or later. You may also need a second dose.  Meningococcal immunization. / 1 dose if you are age 28 to 39 and a first-year college student living in a residence hall, or have one of several medical conditions, you need to get vaccinated against meningococcal disease. You may also need additional booster doses.  Varicella immunization.** / Consult your caregiver.  Hepatitis A immunization.** / Consult your caregiver. 2 doses, 6 to 18 months apart.  Hepatitis B immunization.** / Consult your caregiver. 3 doses usually over 6 months. Ages 89 to 36  Blood pressure check.** / Every 1 to 2 years.  Lipid and cholesterol check.** / Every 5 years beginning at age 15.  Clinical breast exam.** / Every year after age 33.  Mammogram.** / Every year beginning at age 22  and continuing for as long as you are in good health. Consult with your  caregiver.  Pap test.** / Every 3 years starting at age 67 through age 34 or 61 with a history of 3 consecutive normal Pap tests.  HPV screening.** / Every 3 years from ages 33 through ages 67 to 74 with a history of 3 consecutive normal Pap tests.  Fecal occult blood test (FOBT) of stool. / Every year beginning at age 58 and continuing until age 58. You may not need to do this test if you get a colonoscopy every 10 years.  Flexible sigmoidoscopy or colonoscopy.** / Every 5 years for a flexible sigmoidoscopy or every 10 years for a colonoscopy beginning at age 67 and continuing until age 23.  Hepatitis C blood test.** / For all people born from [birth year] through 1964 and any individual with known risks for hepatitis C.  Skin self-exam. / Monthly.  Influenza immunization.** / Every year.  Pneumococcal polysaccharide immunization.** / 1 to 2 doses if you smoke cigarettes or if you have certain chronic medical conditions.  Tetanus, diphtheria, pertussis (Tdap, Td) immunization.** / A one-time dose of Tdap vaccine. After that, you need a Td booster dose every 10 years.  Measles, mumps, rubella (MMR) immunization. / You need at least 1 dose of MMR if you were born in 1957 or later. You may also need a second dose.  Varicella immunization.** / Consult your caregiver.  Meningococcal immunization.** / Consult your caregiver.  Hepatitis A immunization.** / Consult your caregiver. 2 doses, 6 to 18 months apart.  Hepatitis B immunization.** / Consult your caregiver. 3 doses, usually over 6 months. Ages 39 and over  Blood pressure check.** / Every 1 to 2 years.  Lipid and cholesterol check.** / Every 5 years beginning at age 48.  Clinical breast exam.** / Every year after age 70.  Mammogram.** / Every year beginning at age 53 and continuing for as long as you are in good health. Consult with your caregiver.  Pap test.** / Every 3 years starting at age 2 through age 18 or 22 with a 3  consecutive normal Pap tests. Testing can be stopped between 65 and 70 with 3 consecutive normal Pap tests and no abnormal Pap or HPV tests in the past 10 years.  HPV screening.** / Every 3 years from ages 71 through ages 78 or 25 with a history of 3 consecutive normal Pap tests. Testing can be stopped between 65 and 70 with 3 consecutive normal Pap tests and no abnormal Pap or HPV tests in the past 10 years.  Fecal occult blood test (FOBT) of stool. / Every year beginning at age 65 and continuing until age 40. You may not need to do this test if you get a colonoscopy every 10 years.  Flexible sigmoidoscopy or colonoscopy.** / Every 5 years for a flexible sigmoidoscopy or every 10 years for a colonoscopy beginning at age 56 and continuing until age 8.  Hepatitis C blood test.** / For all people born from [birth year] through 1964 and any individual with known risks for hepatitis C.  Osteoporosis screening.** / A one-time screening for women ages 11 and over and women at risk for fractures or osteoporosis.  Skin self-exam. / Monthly.  Influenza immunization.** / Every year.  Pneumococcal polysaccharide immunization.** / 1 dose at age 46 (or older) if you have never been vaccinated.  Tetanus, diphtheria, pertussis (Tdap, Td) immunization. / A one-time dose of Tdap vaccine if you are over  40 and have contact with an infant, are a Dietitian, or simply want to be protected from whooping cough. After that, you need a Td booster dose every 10 years.  Varicella immunization.** / Consult your caregiver.  Meningococcal immunization.** / Consult your caregiver.  Hepatitis A immunization.** / Consult your caregiver. 2 doses, 6 to 18 months apart.  Hepatitis B immunization.** / Check with your caregiver. 3 doses, usually over 6 months. ** Family history and personal history of risk and conditions may change your caregiver's recommendations. Document Released: 06/10/2001 Document Revised: 07/07/2011  Document Reviewed: 09/09/2010 [facility] Patient Information 2014 [place], [place].

## 2013-01-03 NOTE — Assessment & Plan Note (Signed)
With gerd Stop fish oil Take omeprazole daily

## 2013-01-03 NOTE — Assessment & Plan Note (Signed)
Refer GI levsin

## 2013-01-03 NOTE — Assessment & Plan Note (Signed)
Check labs con't meds 

## 2013-01-03 NOTE — Progress Notes (Signed)
Subjective:    Sara Barnes is a 72 y.o. female who presents for Medicare Annual/Subsequent preventive examination.  Preventive Screening-Counseling & Management  Tobacco History  Smoking status  . Never Smoker   Smokeless tobacco  . Never Used     Problems Prior to Visit 1. Pt having Ibs symptoms--- diarrhea and inc gerd symptoms  Current Problems (verified) Patient Active Problem List   Diagnosis Date Noted  . Snoring 04/19/2012  . Acute bronchitis 04/19/2012  . Pulmonary nodule 06/24/2011  . HYPERLIPIDEMIA 03/14/2010  . ARM PAIN, LEFT 03/14/2010  . OSTEOPENIA 01/12/2010  . VITAMIN D DEFICIENCY 12/13/2009  . POSTMENOPAUSAL STATUS 12/13/2009  . SINUSITIS, CHRONIC 12/11/2008  . CHEST PAIN UNSPECIFIED 10/06/2008  . DEPRESSION/ANXIETY 01/20/2008  . IRRITABLE BOWEL SYNDROME 01/20/2008  . BREAST MASS, RIGHT 03/08/2007  . Personal History of Other Diseases of Digestive Disease 08/15/2006  . VITAMIN B12 DEFICIENCY 08/12/2006  . HIATAL HERNIA 08/12/2006  . BREAST IMPLANTS, BILATERAL, HX OF 08/12/2006    Medications Prior to Visit Current Outpatient Prescriptions on File Prior to Visit  Medication Sig Dispense Refill  . zoster vaccine live, PF, (ZOSTAVAX) 17494 UNT/0.65ML injection Inject 19,400 Units into the skin once.  1 vial  0   No current facility-administered medications on file prior to visit.    Current Medications (verified) Current Outpatient Prescriptions  Medication Sig Dispense Refill  . cyanocobalamin (,VITAMIN B-12,) 1000 MCG/ML injection Inject 1 mL (1,000 mcg total) into the muscle every 30 (thirty) days.  10 mL  3  . FLUoxetine (PROZAC) 20 MG capsule TAKE ONE CAPSULE BY MOUTH ONCE DAILY  90 capsule  3  . omeprazole (PRILOSEC) 20 MG capsule TAKE ONE CAPSULE BY MOUTH DAILY  90 capsule  3  . zoster vaccine live, PF, (ZOSTAVAX) 49675 UNT/0.65ML injection Inject 19,400 Units into the skin once.  1 vial  0  . hyoscyamine (LEVSIN/SL) 0.125 MG SL tablet  Place 1 tablet (0.125 mg total) under the tongue every 4 (four) hours as needed for cramping.  30 tablet  0   No current facility-administered medications for this visit.     Allergies (verified) Sulfonamide derivatives; Codeine; and Mesalamine   PAST HISTORY  Family History Family History  Problem Relation Age of Onset  . Hypertension    . Prostate cancer Brother 63  . Other Father     Brain Tumor  . Stomach cancer Paternal Grandfather   . Rheum arthritis Mother   . Rheum arthritis Brother   . Emphysema Maternal Grandfather   . Asthma Sister     Social History History  Substance Use Topics  . Smoking status: Never Smoker   . Smokeless tobacco: Never Used  . Alcohol Use: Yes     Are there smokers in your home (other than you)? No  Risk Factors Current exercise habits: walking  Dietary issues discussed: na   Cardiac risk factors: advanced age (older than 90 for men, 23 for women) and sedentary lifestyle.  Depression Screen (Note: if answer to either of the following is "Yes", a more complete depression screening is indicated)   Over the past two weeks, have you felt down, depressed or hopeless? No  Over the past two weeks, have you felt little interest or pleasure in doing things? No  Have you lost interest or pleasure in daily life? No  Do you often feel hopeless? No  Do you cry easily over simple problems? No  Activities of Daily Living In your present state of health,  do you have any difficulty performing the following activities?:  Driving? No Managing money?  No Feeding yourself? No Getting from bed to chair? No  Climbing a flight of stairs? No Preparing food and eating?: No Bathing or showering? No Getting dressed: No Getting to the toilet? No Using the toilet:No Moving around from place to place: No In the past year have you fallen or had a near fall?:No   Are you sexually active?  Yes  Do you have more than one partner?  No  Hearing  Difficulties: Yes Do you often ask people to speak up or repeat themselves? Yes Do you experience ringing or noises in your ears? No Do you have difficulty understanding soft or whispered voices? Yes   Do you feel that you have a problem with memory? No  Do you often misplace items? No  Do you feel safe at home?  Yes  Cognitive Testing  Alert? Yes  Normal Appearance?Yes  Oriented to person? Yes  Place? Yes   Time? Yes  Recall of three objects?  Yes  Can perform simple calculations? Yes  Displays appropriate judgment?Yes  Can read the correct time from a watch face?Yes   Advanced Directives have been discussed with the patient? Yes  List the Names of Other Physician/Practitioners you currently use: 1.  oph-- sears 2 dentist--[place] 3 GI-- brodie  Indicate any recent Medical Services you may have received from other than [facility] providers in the past year (date may be approximate).  Immunization History  Administered Date(s) Administered  . Pneumococcal Polysaccharide 12/10/2007  . Td 06/04/2004, 08/12/2006    Screening Tests Health Maintenance  Topic Date Due  . Influenza Vaccine  11/26/2012  . Mammogram  02/22/2013  . Colonoscopy  05/27/2014  . Tetanus/tdap  08/11/2016  . Pneumococcal Polysaccharide Vaccine Age 9 And Over  Completed  . Zostavax  Completed    All answers were reviewed with the patient and necessary referrals were made:  [name], DO   01/03/2013   History reviewed:  She  has a past medical history of Depression; Hiatal hernia; GERD (gastroesophageal reflux disease); Vitamin B12 deficiency; Ulcerative colitis; Benign neoplasm; and Pulmonary nodule, right (03/2011). She  does not have any pertinent problems on file. She  has past surgical history that includes Abdominal hysterectomy; Appendectomy; Breast surgery; and Breast enhancement surgery. Her family history includes Asthma in her sister; Emphysema in her maternal grandfather; Hypertension in an  other family member; Other in her father; Prostate cancer (age of onset: 55) in her brother; Rheum arthritis in her brother and mother; Stomach cancer in her paternal grandfather. She  reports that she has never smoked. She has never used smokeless tobacco. She reports that  drinks alcohol. She reports that she does not use illicit drugs. She has a current medication list which includes the following prescription(s): cyanocobalamin, fluoxetine, omeprazole, zoster vaccine live (pf), and hyoscyamine. Current Outpatient Prescriptions on File Prior to Visit  Medication Sig Dispense Refill  . zoster vaccine live, PF, (ZOSTAVAX) 40973 UNT/0.65ML injection Inject 19,400 Units into the skin once.  1 vial  0   No current facility-administered medications on file prior to visit.   She is allergic to sulfonamide derivatives; codeine; and mesalamine.  Review of Systems  Review of Systems  Constitutional: Negative for activity change, appetite change and fatigue.  HENT: Negative for hearing loss, congestion, tinnitus and ear discharge.   Eyes: Negative for visual disturbance (see optho q1y -- vision corrected to 20/20 with glasses).  Respiratory: Negative for cough, chest tightness and shortness of breath.   Cardiovascular: Negative for chest pain, palpitations and leg swelling.  Gastrointestinal: Negative for abdominal pain,  constipation and abdominal distention. -+ diarrhea and gerd Genitourinary: Negative for urgency, frequency, decreased urine volume and difficulty urinating.  Musculoskeletal: Negative for back pain, arthralgias and gait problem.  Skin: Negative for color change, pallor and rash.  Neurological: Negative for dizziness, light-headedness, numbness and headaches.  Hematological: Negative for adenopathy. Does not bruise/bleed easily.  Psychiatric/Behavioral: Negative for suicidal ideas, confusion, sleep disturbance, self-injury, dysphoric mood, decreased concentration and agitation.  Pt  is able to read and write and can do all ADLs No risk for falling No abuse/ violence in home      Objective:     Vision by Snellen chart: opth  Body mass index is 24.69 kg/(m^2). BP 124/78  Pulse 97  Temp(Src) 98.4 F (36.9 C) (Oral)  Ht 5' 7.2" (1.707 m)  Wt 158 lb 9.6 oz (71.94 kg)  BMI 24.69 kg/m2  SpO2 97%  BP 124/78  Pulse 97  Temp(Src) 98.4 F (36.9 C) (Oral)  Ht 5' 7.2" (1.707 m)  Wt 158 lb 9.6 oz (71.94 kg)  BMI 24.69 kg/m2  SpO2 97% General appearance: alert, cooperative, appears stated age and no distress Head: Normocephalic, without obvious abnormality, atraumatic Eyes: conjunctivae/corneas clear. PERRL, EOM's intact. Fundi benign. Ears: normal TM's and external ear canals both ears Nose: Nares normal. Septum midline. Mucosa normal. No drainage or sinus tenderness. Throat: lips, mucosa, and tongue normal; teeth and gums normal Neck: no adenopathy, no carotid bruit, no JVD, supple, symmetrical, trachea midline and thyroid not enlarged, symmetric, no tenderness/mass/nodules Back: symmetric, no curvature. ROM normal. No CVA tenderness. Lungs: clear to auscultation bilaterally Breasts: normal appearance, no masses or tenderness Heart: regular rate and rhythm, S1, S2 normal, no murmur, click, rub or gallop Abdomen: soft, non-tender; bowel sounds normal; no masses,  no organomegaly Pelvic: deferred and not indicated; post-menopausal, no abnormal Pap smears in past Extremities: extremities normal, atraumatic, no cyanosis or edema Pulses: 2+ and symmetric Skin: Skin color, texture, turgor normal. No rashes or lesions Lymph nodes: Cervical, supraclavicular, and axillary nodes normal. Neurologic: Alert and oriented X 3, normal strength and tone. Normal symmetric reflexes. Normal coordination and gait Psych-- no depression, no anxiety      Assessment:     cpe      Plan:     During the course of the visit the patient was educated and counseled about  appropriate screening and preventive services including:    Pneumococcal vaccine   Influenza vaccine  Screening mammography  Screening Pap smear and pelvic exam   Bone densitometry screening  Colorectal cancer screening  Diabetes screening  Glaucoma screening  Advanced directives: has an advanced directive - a copy HAS NOT been provided.  Diet review for nutrition referral? Yes ____  Not Indicated ___x_   Patient Instructions (the written plan) was given to the patient.  Medicare Attestation I have personally reviewed: The patient's medical and social history Their use of alcohol, tobacco or illicit drugs Their current medications and supplements The patient's functional ability including ADLs,fall risks, home safety risks, cognitive, and hearing and visual impairment Diet and physical activities Evidence for depression or mood disorders  The patient's weight, height, BMI, and visual acuity have been recorded in the chart.  I have made referrals, counseling, and provided education to the patient based on review of the above and I have provided the patient  with a written personalized care plan for preventive services.     [name], DO   01/03/2013

## 2013-01-04 LAB — MICROALBUMIN / CREATININE URINE RATIO: Microalb Creat Ratio: 4.4 mg/g (ref 0.0–30.0)

## 2013-01-05 ENCOUNTER — Other Ambulatory Visit: Payer: Self-pay

## 2013-01-05 MED ORDER — ATORVASTATIN CALCIUM 20 MG PO TABS: 20.0000 mg | ORAL_TABLET | Freq: Every day | ORAL | Status: DC

## 2013-01-13 ENCOUNTER — Encounter: Payer: Self-pay | Admitting: Family Medicine

## 2013-01-14 NOTE — Telephone Encounter (Signed)
Have we decided whether or not we were mailing labs that have been released?        KP

## 2013-01-28 ENCOUNTER — Ambulatory Visit: Payer: Medicare Other | Admitting: Internal Medicine

## 2013-02-23 ENCOUNTER — Other Ambulatory Visit: Payer: Self-pay | Admitting: Family Medicine

## 2013-02-23 ENCOUNTER — Ambulatory Visit
Admission: RE | Admit: 2013-02-23 | Discharge: 2013-02-23 | Disposition: A | Discharge status: Home / Self Care | Payer: Self-pay | Source: Ambulatory Visit | Attending: Family Medicine | Admitting: Family Medicine

## 2013-02-23 DIAGNOSIS — Z1239 Encounter for other screening for malignant neoplasm of breast: Secondary | ICD-10-CM

## 2013-02-23 DIAGNOSIS — E2839 Other primary ovarian failure: Secondary | ICD-10-CM

## 2013-02-25 ENCOUNTER — Other Ambulatory Visit: Payer: Self-pay | Admitting: Family Medicine

## 2013-02-25 DIAGNOSIS — R928 Other abnormal and inconclusive findings on diagnostic imaging of breast: Secondary | ICD-10-CM

## 2013-03-01 ENCOUNTER — Ambulatory Visit (INDEPENDENT_AMBULATORY_CARE_PROVIDER_SITE_OTHER): Payer: Medicare Other | Admitting: Family Medicine

## 2013-03-01 ENCOUNTER — Encounter: Payer: Self-pay | Admitting: Family Medicine

## 2013-03-01 VITALS — BP 116/80 | HR 88 | Temp 98.4°F | Resp 16 | Wt 158.4 lb

## 2013-03-01 DIAGNOSIS — M549 Dorsalgia, unspecified: Secondary | ICD-10-CM

## 2013-03-01 DIAGNOSIS — J029 Acute pharyngitis, unspecified: Secondary | ICD-10-CM

## 2013-03-01 DIAGNOSIS — M546 Pain in thoracic spine: Secondary | ICD-10-CM

## 2013-03-01 MED ORDER — AMOXICILLIN 875 MG PO TABS: 875.0000 mg | ORAL_TABLET | Freq: Two times a day (BID) | ORAL | Status: DC

## 2013-03-01 MED ORDER — PREDNISONE 10 MG PO TABS: ORAL_TABLET | ORAL | Status: DC

## 2013-03-01 NOTE — Assessment & Plan Note (Signed)
pred taper F/u ortho if no better

## 2013-03-01 NOTE — Progress Notes (Signed)
  Subjective:     Sara Barnes is a 72 y.o. female who presents for evaluation of sore throat. Associated symptoms include nasal blockage, post nasal drip, sinus and nasal congestion, sore throat and swollen glands. Onset of symptoms was 4 days ago, and have been gradually worsening since that time. She is drinking plenty of fluids. She has not had a recent close exposure to someone with proven streptococcal pharyngitis.  Pt also c/o neck shoulder and back pain. She had seen Dr Matilde Haymaker in past for it and was given pred pack and mult injections.   The following portions of the patient's history were reviewed and updated as appropriate: allergies, current medications, past family history, past medical history, past social history, past surgical history and problem list.  Review of Systems Pertinent items are noted in HPI.    Objective:    BP 116/80  Pulse 88  Temp(Src) 98.4 F (36.9 C) (Oral)  Resp 16  Wt 158 lb 6 oz (71.838 kg)  SpO2 97% General appearance: alert, cooperative, appears stated age and no distress Ears: + fluid b/l Nose: green discharge, moderate congestion, turbinates red, swollen, sinus tenderness bilateral Throat: abnormal findings: moderate oropharyngeal erythema and Pnd Neck: moderate anterior cervical adenopathy, supple, symmetrical, trachea midline and thyroid not enlarged, symmetric, no tenderness/mass/nodules Lungs: clear to auscultation bilaterally Heart: S1, S2 normal Extremities: extremities normal, atraumatic, no cyanosis or edema Lymph nodes: mild cerv adenopathy Neurologic: Grossly normal  Laboratory Strep test done. Results:negative.    Assessment:    Acute pharyngitis, likely  Sinusitis with post nasal drip.    Plan:    Patient placed on antibiotics. Follow up as needed. antihistamine otc

## 2013-03-01 NOTE — Patient Instructions (Signed)
Viral and Bacterial Pharyngitis Pharyngitis is soreness (inflammation) or infection of the pharynx. It is also called a sore throat. CAUSES  Most sore throats are caused by viruses and are part of a cold. However, some sore throats are caused by strep and other bacteria. Sore throats can also be caused by post nasal drip from draining sinuses, allergies and sometimes from sleeping with an open mouth. Infectious sore throats can be spread from person to person by coughing, sneezing and sharing cups or eating utensils. TREATMENT  Sore throats that are viral usually last 3-4 days. Viral illness will get better without medications (antibiotics). Strep throat and other bacterial infections will usually begin to get better about 24-48 hours after you begin to take antibiotics. HOME CARE INSTRUCTIONS   If the caregiver feels there is a bacterial infection or if there is a positive strep test, they will prescribe an antibiotic. The full course of antibiotics must be taken. If the full course of antibiotic is not taken, you or your child may become ill again. If you or your child has strep throat and do not finish all of the medication, serious heart or kidney diseases may develop.  Drink enough water and fluids to keep your urine clear or pale yellow.  Only take over-the-counter or prescription medicines for pain, discomfort or fever as directed by your caregiver.  Get lots of rest.  Gargle with salt water ( tsp. of salt in a glass of water) as often as every 1-2 hours as you need for comfort.  Hard candies may soothe the throat if individual is not at risk for choking. Throat sprays or lozenges may also be used. SEEK MEDICAL CARE IF:   Large, tender lumps in the neck develop.  A rash develops.  Green, yellow-brown or bloody sputum is coughed up.  Your baby is older than 3 months with a rectal temperature of 100.5 F (38.1 C) or higher for more than 1 day. SEEK IMMEDIATE MEDICAL CARE IF:   A  stiff neck develops.  You or your child are drooling or unable to swallow liquids.  You or your child are vomiting, unable to keep medications or liquids down.  You or your child has severe pain, unrelieved with recommended medications.  You or your child are having difficulty breathing (not due to stuffy nose).  You or your child are unable to fully open your mouth.  You or your child develop redness, swelling, or severe pain anywhere on the neck.  You have a fever.  Your baby is older than 3 months with a rectal temperature of 102 F (38.9 C) or higher.  Your baby is 63 months old or younger with a rectal temperature of 100.4 F (38 C) or higher. MAKE SURE YOU:   Understand these instructions.  Will watch your condition.  Will get help right away if you are not doing well or get worse. Document Released: 04/14/2005 Document Revised: 07/07/2011 Document Reviewed: 07/12/2007 [facility] Patient Information 2014 [place], [place].

## 2013-03-03 ENCOUNTER — Other Ambulatory Visit: Payer: Self-pay

## 2013-03-03 LAB — CULTURE, GROUP A STREP: Organism ID, Bacteria: NORMAL

## 2013-03-16 ENCOUNTER — Other Ambulatory Visit: Payer: Self-pay

## 2013-03-16 MED ORDER — ALENDRONATE SODIUM 70 MG PO TABS: 70.0000 mg | ORAL_TABLET | ORAL | Status: DC

## 2013-03-21 ENCOUNTER — Other Ambulatory Visit: Payer: Medicare Other

## 2013-03-22 ENCOUNTER — Ambulatory Visit
Admission: RE | Admit: 2013-03-22 | Discharge: 2013-03-22 | Disposition: A | Discharge status: Home / Self Care | Payer: Self-pay | Source: Ambulatory Visit | Attending: Family Medicine | Admitting: Family Medicine

## 2013-03-22 DIAGNOSIS — R928 Other abnormal and inconclusive findings on diagnostic imaging of breast: Secondary | ICD-10-CM

## 2013-04-12 ENCOUNTER — Other Ambulatory Visit (INDEPENDENT_AMBULATORY_CARE_PROVIDER_SITE_OTHER): Payer: Medicare Other

## 2013-04-12 DIAGNOSIS — E785 Hyperlipidemia, unspecified: Secondary | ICD-10-CM

## 2013-04-12 LAB — HEPATIC FUNCTION PANEL
AST: 23 U/L (ref 0–37)
Albumin: 4.1 g/dL (ref 3.5–5.2)
Alkaline Phosphatase: 71 U/L (ref 39–117)
Bilirubin, Direct: 0 mg/dL (ref 0.0–0.3)
Total Protein: 7 g/dL (ref 6.0–8.3)

## 2013-04-12 LAB — LIPID PANEL
Cholesterol: 173 mg/dL (ref 0–200)
HDL: 44.7 mg/dL (ref >39.00)
Triglycerides: 280 mg/dL — ABNORMAL HIGH (ref 0.0–149.0)

## 2013-04-20 ENCOUNTER — Encounter: Payer: Self-pay | Admitting: *Deleted

## 2013-04-20 ENCOUNTER — Other Ambulatory Visit: Payer: Self-pay | Admitting: *Deleted

## 2013-04-20 DIAGNOSIS — E785 Hyperlipidemia, unspecified: Secondary | ICD-10-CM

## 2013-04-20 MED ORDER — FENOFIBRATE 160 MG PO TABS: 160.0000 mg | ORAL_TABLET | Freq: Every day | ORAL | Status: DC

## 2013-05-23 ENCOUNTER — Telehealth: Payer: Self-pay | Admitting: *Deleted

## 2013-05-23 NOTE — Telephone Encounter (Signed)
Forwarded to [employer]

## 2013-05-23 NOTE — Telephone Encounter (Signed)
Patient called and stated that she needed a referral to continue to see dr [name] since her insurance changed to [name].   [employer] id [id number] Reason nauseous and diarrhea

## 2013-06-03 ENCOUNTER — Encounter: Payer: Self-pay | Admitting: *Deleted

## 2013-06-28 ENCOUNTER — Ambulatory Visit (INDEPENDENT_AMBULATORY_CARE_PROVIDER_SITE_OTHER): Payer: Commercial Managed Care - HMO | Admitting: Internal Medicine

## 2013-06-28 ENCOUNTER — Encounter: Payer: Self-pay | Admitting: Internal Medicine

## 2013-06-28 ENCOUNTER — Other Ambulatory Visit (INDEPENDENT_AMBULATORY_CARE_PROVIDER_SITE_OTHER): Payer: Commercial Managed Care - HMO

## 2013-06-28 VITALS — BP 150/92 | HR 76 | Ht 65.75 in | Wt 161.5 lb

## 2013-06-28 DIAGNOSIS — K5100 Ulcerative (chronic) pancolitis without complications: Secondary | ICD-10-CM

## 2013-06-28 DIAGNOSIS — K3189 Other diseases of stomach and duodenum: Secondary | ICD-10-CM

## 2013-06-28 DIAGNOSIS — R1013 Epigastric pain: Secondary | ICD-10-CM

## 2013-06-28 LAB — SEDIMENTATION RATE: SED RATE: 7 mm/h (ref 0–22)

## 2013-06-28 LAB — CBC WITH DIFFERENTIAL/PLATELET
BASOS ABS: 0 10*3/uL (ref 0.0–0.1)
Basophils Relative: 0.4 % (ref 0.0–3.0)
Eosinophils Absolute: 0.1 10*3/uL (ref 0.0–0.7)
Eosinophils Relative: 1.1 % (ref 0.0–5.0)
HEMATOCRIT: 42 % (ref 36.0–46.0)
Hemoglobin: 14.1 g/dL (ref 12.0–15.0)
Lymphocytes Relative: 24.2 % (ref 12.0–46.0)
Lymphs Abs: 2.1 10*3/uL (ref 0.7–4.0)
MCHC: 33.7 g/dL (ref 30.0–36.0)
MCV: 92.8 fl (ref 78.0–100.0)
MONO ABS: 0.5 10*3/uL (ref 0.1–1.0)
Monocytes Relative: 5.5 % (ref 3.0–12.0)
NEUTROS PCT: 68.8 % (ref 43.0–77.0)
Neutro Abs: 6 10*3/uL (ref 1.4–7.7)
PLATELETS: 221 10*3/uL (ref 150.0–400.0)
RBC: 4.52 Mil/uL (ref 3.87–5.11)
RDW: 11.9 % (ref 11.5–14.6)
WBC: 8.8 10*3/uL (ref 4.5–10.5)

## 2013-06-28 LAB — COMPREHENSIVE METABOLIC PANEL
ALBUMIN: 3.7 g/dL (ref 3.5–5.2)
ALK PHOS: 73 U/L (ref 39–117)
ALT: 13 U/L (ref 0–35)
AST: 19 U/L (ref 0–37)
BUN: 13 mg/dL (ref 6–23)
CALCIUM: 9.2 mg/dL (ref 8.4–10.5)
CO2: 23 mEq/L (ref 19–32)
Chloride: 102 mEq/L (ref 96–112)
Creatinine, Ser: 0.8 mg/dL (ref 0.4–1.2)
GFR: 75.01 mL/min (ref >60.00)
Glucose, Bld: 97 mg/dL (ref 70–99)
POTASSIUM: 3.9 meq/L (ref 3.5–5.1)
SODIUM: 136 meq/L (ref 135–145)
TOTAL PROTEIN: 6.9 g/dL (ref 6.0–8.3)
Total Bilirubin: 0.6 mg/dL (ref 0.3–1.2)

## 2013-06-28 LAB — IBC PANEL
IRON: 94 ug/dL (ref 42–145)
Saturation Ratios: 26.8 % (ref 20.0–50.0)
Transferrin: 250.6 mg/dL (ref 212.0–360.0)

## 2013-06-28 MED ORDER — DICYCLOMINE HCL 10 MG PO CAPS: 10.0000 mg | ORAL_CAPSULE | Freq: Two times a day (BID) | ORAL | Status: DC

## 2013-06-28 NOTE — Progress Notes (Signed)
Sara Barnes [birth year] [id number]  Note: This dictation was prepared with Dragon digital system. Any transcriptional errors that result from this procedure are unintentional.   History of Present Illness:  This is a 73 year old white female with ulcerative colitis of more than 30 years duration, since 85. Her last colonoscopy in January 2013 showed active colitis and mild stricture at the splenic flexure. She has intolerance to mesalamine. She has recently been having more loose stools. She denies weight loss or rectal bleeding. She has food intolerance. She also reports some symptoms of gastroesophageal reflux. Patient takes omeprazole 20 mg daily.    Past Medical History  Diagnosis Date  . Depression   . Hiatal hernia   . GERD (gastroesophageal reflux disease)   . Vitamin B12 deficiency   . Ulcerative colitis   . Benign neoplasm     of skin of trunk  . Pulmonary nodule, right 03/2011    Rt middle lobe 3 mm  . Colitis     Past Surgical History  Procedure Laterality Date  . Abdominal hysterectomy    . Appendectomy    . Breast surgery      Lumpectomy-Left breast  . Breast enhancement surgery      Allergies  Allergen Reactions  . Sulfonamide Derivatives Nausea And Vomiting, Swelling and Rash       . Codeine Nausea And Vomiting  . Mesalamine     questionable    Family history and social history have been reviewed.  Review of Systems: 40 intolerance bloating dyspepsia and diarrhea  The remainder of the 10 point ROS is negative except as outlined in the H&P  Physical Exam: General Appearance Well developed, in no distress Eyes  Non icteric  HEENT  Non traumatic, normocephalic  Mouth No lesion, tongue papillated, no cheilosis Neck Supple without adenopathy, thyroid not enlarged, no carotid bruits, no JVD Lungs Clear to auscultation bilaterally COR Normal S1, normal S2, regular rhythm, no murmur, quiet precordium Abdomen mildly protuberant. Soft. Minimal  tenderness in left lower quadrant. Liver edge at costal margin Rectal soft Hemoccult negative stool Extremities  No pedal edema Skin No lesions Neurological Alert and oriented x 3 Psychological Normal mood and affect  Assessment and Plan:   Problem #1 Long-standing ulcerative colitis with symptoms of increasing diarrhea and dyspepsia. She will be due for recall colonoscopy in January 2016. She is questionably allergic to mesalamine. Today, she would like to request tests before being started on any medications. We will obtain an upper abdominal ultrasound to rule out primary gallbladder disease which runs in the family. We will also check a sedimentation rate, metabolic panel, CBC and iron studies. I would like her to take Bentyl 10 mg twice a day and we will consider starting her on Colazal 750 mg on a low-dose depending on the results of the blood tests. We will also obtain stool for pathogens and lactoferrin.     [name] 06/28/2013

## 2013-06-28 NOTE — Patient Instructions (Addendum)
We have sent the following medications to your pharmacy for you to pick up at your convenience: CMET, Sed Rate, CBC, IBC, Stool Culture, GI Pathogen Panel, Lactoferrin  You have been scheduled for an abdominal ultrasound at [facility] (1st floor of hospital) on Wednesday, 07/06/13 at 9:00 am. Please arrive 15 minutes prior to your appointment for registration. Make certain not to have anything to eat or drink 6 hours prior to your appointment. Should you need to reschedule your appointment, please contact radiology at [phone]. This test typically takes about 30 minutes to perform.  Dr Etter Sjogren

## 2013-06-30 ENCOUNTER — Other Ambulatory Visit: Payer: Commercial Managed Care - HMO

## 2013-06-30 DIAGNOSIS — R1013 Epigastric pain: Secondary | ICD-10-CM

## 2013-07-01 LAB — GASTROINTESTINAL PATHOGEN PANEL PCR
C. difficile Tox A/B, PCR: NEGATIVE
Campylobacter, PCR: NEGATIVE
Cryptosporidium, PCR: NEGATIVE
E coli (ETEC) LT/ST PCR: NEGATIVE
E coli (STEC) stx1/stx2, PCR: NEGATIVE
E coli 0157, PCR: NEGATIVE
Giardia lamblia, PCR: NEGATIVE
NOROVIRUS, PCR: NEGATIVE
ROTAVIRUS, PCR: NEGATIVE
SALMONELLA, PCR: NEGATIVE
SHIGELLA, PCR: NEGATIVE

## 2013-07-01 LAB — FECAL LACTOFERRIN, QUANT: Lactoferrin: NEGATIVE

## 2013-07-04 LAB — STOOL CULTURE

## 2013-07-06 ENCOUNTER — Ambulatory Visit (HOSPITAL_COMMUNITY)
Admission: RE | Admit: 2013-07-06 | Discharge: 2013-07-06 | Disposition: A | Discharge status: Home / Self Care | Payer: Medicare HMO | Source: Ambulatory Visit | Attending: Internal Medicine | Admitting: Internal Medicine

## 2013-07-06 DIAGNOSIS — K3189 Other diseases of stomach and duodenum: Secondary | ICD-10-CM | POA: Insufficient documentation

## 2013-07-06 DIAGNOSIS — R1013 Epigastric pain: Secondary | ICD-10-CM

## 2013-07-06 DIAGNOSIS — R52 Pain, unspecified: Secondary | ICD-10-CM | POA: Insufficient documentation

## 2013-07-06 DIAGNOSIS — R109 Unspecified abdominal pain: Secondary | ICD-10-CM | POA: Insufficient documentation

## 2013-08-19 ENCOUNTER — Encounter: Payer: Self-pay | Admitting: Family Medicine

## 2013-08-19 ENCOUNTER — Ambulatory Visit (INDEPENDENT_AMBULATORY_CARE_PROVIDER_SITE_OTHER): Payer: Commercial Managed Care - HMO | Admitting: Family Medicine

## 2013-08-19 VITALS — BP 122/74 | HR 74 | Temp 97.7°F | Wt 157.4 lb

## 2013-08-19 DIAGNOSIS — E785 Hyperlipidemia, unspecified: Secondary | ICD-10-CM

## 2013-08-19 LAB — HEPATIC FUNCTION PANEL
ALT: 13 U/L (ref 0–35)
AST: 19 U/L (ref 0–37)
Albumin: 3.7 g/dL (ref 3.5–5.2)
Alkaline Phosphatase: 69 U/L (ref 39–117)
BILIRUBIN DIRECT: 0 mg/dL (ref 0.0–0.3)
TOTAL PROTEIN: 6.8 g/dL (ref 6.0–8.3)
Total Bilirubin: 0.4 mg/dL (ref 0.3–1.2)

## 2013-08-19 LAB — BASIC METABOLIC PANEL
BUN: 14 mg/dL (ref 6–23)
CHLORIDE: 106 meq/L (ref 96–112)
CO2: 22 mEq/L (ref 19–32)
CREATININE: 0.7 mg/dL (ref 0.4–1.2)
Calcium: 9.1 mg/dL (ref 8.4–10.5)
GFR: 88.94 mL/min (ref >60.00)
GLUCOSE: 87 mg/dL (ref 70–99)
Potassium: 3.8 mEq/L (ref 3.5–5.1)
Sodium: 137 mEq/L (ref 135–145)

## 2013-08-19 LAB — LIPID PANEL
CHOL/HDL RATIO: 3
CHOLESTEROL: 134 mg/dL (ref 0–200)
HDL: 45.3 mg/dL (ref >39.00)
LDL Cholesterol: 51 mg/dL (ref 0–99)
TRIGLYCERIDES: 191 mg/dL — AB (ref 0.0–149.0)
VLDL: 38.2 mg/dL (ref 0.0–40.0)

## 2013-08-19 NOTE — Progress Notes (Signed)
Pre visit review using our clinic review tool, if applicable. No additional management support is needed unless otherwise documented below in the visit note. 

## 2013-08-19 NOTE — Progress Notes (Signed)
   Subjective:    Patient ID: Sara Barnes, female    DOB: 28-Mar-1941, 73 y.o.   MRN: [id number]  HPI Pt here f/u lipids.  She c/o lipitor causing weakness so she dec it to 1/2 tab every other day and has no problems    Review of Systems As above.    Objective:   Physical Exam BP 122/74  Pulse 74  Temp(Src) 97.7 F (36.5 C) (Oral)  Wt 157 lb 6.4 oz (71.396 kg)  SpO2 97% General appearance: alert, cooperative, appears stated age and no distress Throat: lips, mucosa, and tongue normal; teeth and gums normal Neck: no adenopathy, supple, symmetrical, trachea midline and thyroid not enlarged, symmetric, no tenderness/mass/nodules Lungs: clear to auscultation bilaterally Heart: regular rate and rhythm, S1, S2 normal, no murmur, click, rub or gallop Extremities: extremities normal, atraumatic, no cyanosis or edema         Assessment & Plan:  1. Other and unspecified hyperlipidemia lipitor caused muscle weakness and aches - Basic metabolic panel - Hepatic function panel - Lipid panel

## 2013-09-25 ENCOUNTER — Emergency Department (HOSPITAL_COMMUNITY)
Admission: EM | Admit: 2013-09-25 | Discharge: 2013-09-25 | Disposition: A | Discharge status: Home / Self Care | Payer: Medicare HMO | Source: Home / Self Care | Attending: Emergency Medicine | Admitting: Emergency Medicine

## 2013-09-25 ENCOUNTER — Encounter (HOSPITAL_COMMUNITY): Payer: Self-pay | Admitting: Emergency Medicine

## 2013-09-25 DIAGNOSIS — K6289 Other specified diseases of anus and rectum: Secondary | ICD-10-CM

## 2013-09-25 DIAGNOSIS — K921 Melena: Secondary | ICD-10-CM

## 2013-09-25 LAB — DIFFERENTIAL
Basophils Absolute: 0 10*3/uL (ref 0.0–0.1)
Basophils Relative: 0 % (ref 0–1)
EOS PCT: 0 % (ref 0–5)
Eosinophils Absolute: 0.1 10*3/uL (ref 0.0–0.7)
LYMPHS ABS: 1.9 10*3/uL (ref 0.7–4.0)
LYMPHS PCT: 13 % (ref 12–46)
MONO ABS: 1.4 10*3/uL — AB (ref 0.1–1.0)
Monocytes Relative: 10 % (ref 3–12)
Neutro Abs: 10.9 10*3/uL — ABNORMAL HIGH (ref 1.7–7.7)
Neutrophils Relative %: 77 % (ref 43–77)

## 2013-09-25 LAB — POC OCCULT BLOOD, ED: FECAL OCCULT BLD: POSITIVE — AB

## 2013-09-25 LAB — CBC
HCT: 40.3 % (ref 36.0–46.0)
Hemoglobin: 13.4 g/dL (ref 12.0–15.0)
MCH: 31.1 pg (ref 26.0–34.0)
MCHC: 33.3 g/dL (ref 30.0–36.0)
MCV: 93.5 fL (ref 78.0–100.0)
Platelets: 281 10*3/uL (ref 150–400)
RBC: 4.31 MIL/uL (ref 3.87–5.11)
RDW: 12 % (ref 11.5–15.5)
WBC: 14.5 10*3/uL — ABNORMAL HIGH (ref 4.0–10.5)

## 2013-09-25 LAB — URINALYSIS, ROUTINE W REFLEX MICROSCOPIC
BILIRUBIN URINE: NEGATIVE
Glucose, UA: NEGATIVE mg/dL
KETONES UR: NEGATIVE mg/dL
Nitrite: NEGATIVE
Protein, ur: NEGATIVE mg/dL
SPECIFIC GRAVITY, URINE: 1.011 (ref 1.005–1.030)
UROBILINOGEN UA: 0.2 mg/dL (ref 0.0–1.0)
pH: 6 (ref 5.0–8.0)

## 2013-09-25 LAB — COMPREHENSIVE METABOLIC PANEL
ALK PHOS: 117 U/L (ref 39–117)
ALT: 8 U/L (ref 0–35)
AST: 12 U/L (ref 0–37)
Albumin: 3.1 g/dL — ABNORMAL LOW (ref 3.5–5.2)
BILIRUBIN TOTAL: 0.4 mg/dL (ref 0.3–1.2)
BUN: 11 mg/dL (ref 6–23)
CO2: 20 mEq/L (ref 19–32)
Calcium: 9.6 mg/dL (ref 8.4–10.5)
Chloride: 99 mEq/L (ref 96–112)
Creatinine, Ser: 0.72 mg/dL (ref 0.50–1.10)
GFR, EST NON AFRICAN AMERICAN: 84 mL/min — AB (ref >90)
Glucose, Bld: 113 mg/dL — ABNORMAL HIGH (ref 70–99)
POTASSIUM: 3.9 meq/L (ref 3.7–5.3)
Sodium: 135 mEq/L — ABNORMAL LOW (ref 137–147)
TOTAL PROTEIN: 7.1 g/dL (ref 6.0–8.3)

## 2013-09-25 LAB — URINE MICROSCOPIC-ADD ON

## 2013-09-25 LAB — TYPE AND SCREEN
ABO/RH(D): A NEG
Antibody Screen: NEGATIVE

## 2013-09-25 LAB — ABO/RH: ABO/RH(D): A NEG

## 2013-09-25 MED ORDER — HYDROCODONE-ACETAMINOPHEN 5-325 MG PO TABS: ORAL_TABLET | ORAL | Status: DC

## 2013-09-25 MED ORDER — HYDROCORTISONE ACETATE 25 MG RE SUPP: 25.0000 mg | Freq: Two times a day (BID) | RECTAL | Status: DC

## 2013-09-25 NOTE — ED Provider Notes (Signed)
Medical screening examination/treatment/procedure(s) were conducted as a shared visit with non-physician practitioner(s) and myself.  I personally evaluated the patient during the encounter.   EKG Interpretation None      Well appearing. Outpatient GI follow up. Normal vitals  Hoy Morn, MD 09/25/13 1650

## 2013-09-25 NOTE — ED Provider Notes (Signed)
CSN: [id number]     Arrival date & time 09/25/13  1247 History   First MD Initiated Contact with Patient 09/25/13 1306     Chief Complaint  Patient presents with  . Rectal Bleeding     (Consider location/radiation/quality/duration/timing/severity/associated sxs/prior Treatment) HPI Comments: Patient with history of ulcerative colitis, previous appendectomy, previous partial hysterectomy -- presents with complaint of rectal bleeding and pain for the past 3 weeks. Initially she had milder pain and intermittent bleeding that she thought was associated with her hemorrhoids. She used a hemorrhoid cream without relief. Pain and bleeding have gradually worsened. The pain is now constant. Patient is passing clots of dark blood. She has a sensation of pressure in her pelvis. Symptoms are somewhat resolved after she has a bowel movement. She's been taking ibuprofen without relief. She denies fever, chest pain, shortness of breath, abdominal pain. No urinary symptoms or blood in her urine. She has not seen her gastroenterologist regarding this. No anticoagulant use.   Patient is a 73 y.o. female presenting with hematochezia. The history is provided by the patient and medical records.  Rectal Bleeding Associated symptoms: no abdominal pain, no fever and no vomiting     Past Medical History  Diagnosis Date  . Depression   . Hiatal hernia   . GERD (gastroesophageal reflux disease)   . Vitamin B12 deficiency   . Ulcerative colitis   . Benign neoplasm     of skin of trunk  . Pulmonary nodule, right 03/2011    Rt middle lobe 3 mm  . Colitis    Past Surgical History  Procedure Laterality Date  . Abdominal hysterectomy    . Appendectomy    . Breast surgery      Lumpectomy-Left breast  . Breast enhancement surgery     Family History  Problem Relation Age of Onset  . Hypertension    . Prostate cancer Brother 17  . Other Father     Brain Tumor  . Stomach cancer Paternal Grandfather   . Rheum  arthritis Mother   . Rheum arthritis Brother   . Emphysema Maternal Grandfather   . Asthma Sister    History  Substance Use Topics  . Smoking status: Never Smoker   . Smokeless tobacco: Never Used  . Alcohol Use: Yes   OB History   Grav Para Term Preterm Abortions TAB SAB Ect Mult Living                 Review of Systems  Constitutional: Negative for fever.  HENT: Negative for rhinorrhea and sore throat.   Eyes: Negative for redness.  Respiratory: Negative for cough.   Cardiovascular: Negative for chest pain.  Gastrointestinal: Positive for blood in stool, hematochezia and rectal pain. Negative for nausea, vomiting, abdominal pain and diarrhea.  Genitourinary: Negative for dysuria.  Musculoskeletal: Negative for myalgias.  Skin: Negative for rash.  Neurological: Negative for headaches.    Allergies  Sulfonamide derivatives; Codeine; and Mesalamine  Home Medications   Prior to Admission medications   Medication Sig Start Date End Date Taking? Authorizing Provider  atorvastatin (LIPITOR) 20 MG tablet Take 10 mg by mouth every other day. 01/05/13   Rosalita Chessman, DO  cyanocobalamin (,VITAMIN B-12,) 1000 MCG/ML injection Inject 1 mL (1,000 mcg total) into the muscle every 30 (thirty) days. 01/03/13   Rosalita Chessman, DO  dicyclomine (BENTYL) 10 MG capsule Take 1 capsule (10 mg total) by mouth 2 (two) times daily. 06/28/13   Lowella Bandy  Olevia Perches, MD  FLUoxetine (PROZAC) 20 MG capsule TAKE ONE CAPSULE BY MOUTH ONCE DAILY 01/03/13   Rosalita Chessman, DO  omeprazole (PRILOSEC) 20 MG capsule Take 20 mg by mouth as needed. 01/03/13   Alferd Apa Lowne, DO   BP 136/78  Pulse 97  Temp(Src) 97.5 F (36.4 C) (Oral)  Resp 16  Ht 5' 6"  (1.676 m)  Wt 154 lb 1 oz (69.882 kg)  BMI 24.88 kg/m2  SpO2 97%  Physical Exam  Nursing note and vitals reviewed. Constitutional: She appears well-developed and well-nourished.  HENT:  Head: Normocephalic and atraumatic.  Eyes: Conjunctivae are normal. Right eye  exhibits no discharge. Left eye exhibits no discharge.  Neck: Normal range of motion. Neck supple.  Cardiovascular: Normal rate, regular rhythm and normal heart sounds.   Pulmonary/Chest: Effort normal and breath sounds normal.  Abdominal: Soft. There is no tenderness.  Genitourinary: Rectal exam shows external hemorrhoid (non-thrombosed/non-inflamed) and tenderness (severe). Rectal exam shows no internal hemorrhoid, no fissure, no mass and anal tone normal.  Neurological: She is alert.  Skin: Skin is warm and dry.  Psychiatric: She has a normal mood and affect.    ED Course  Procedures (including critical care time) Labs Review Labs Reviewed  CBC - Abnormal; Notable for the following:    WBC 14.5 (*)    All other components within normal limits  COMPREHENSIVE METABOLIC PANEL - Abnormal; Notable for the following:    Sodium 135 (*)    Glucose, Bld 113 (*)    Albumin 3.1 (*)    GFR calc non Af Amer 84 (*)    All other components within normal limits  URINALYSIS, ROUTINE W REFLEX MICROSCOPIC - Abnormal; Notable for the following:    Hgb urine dipstick TRACE (*)    Leukocytes, UA MODERATE (*)    All other components within normal limits  DIFFERENTIAL - Abnormal; Notable for the following:    Neutro Abs 10.9 (*)    Monocytes Absolute 1.4 (*)    All other components within normal limits  POC OCCULT BLOOD, ED - Abnormal; Notable for the following:    Fecal Occult Bld POSITIVE (*)    All other components within normal limits  URINE MICROSCOPIC-ADD ON  TYPE AND SCREEN  ABO/RH    Imaging Review No results found.   EKG Interpretation None      1:26 PM Patient seen and examined. Work-up initiated. Patient declines pain medication. Pt d/w Dr. Venora Maples who will see. Work-up pending.   Vital signs reviewed and are as follows: Filed Vitals:   09/25/13 1258  BP: 136/78  Pulse: 97  Temp: 97.5 F (36.4 C)  Resp: 16   3:35 PM Pt d/w and seen by Dr. Venora Maples.   I spoke with Dr.  Henrene Pastor and discussed f/u. He advises to have patient call in AM for appointment to be seen.   Will d/c to home with pain medication and anusol suppository.   Patient urged to return with worsening symptoms or other concerns. Patient verbalized understanding and agrees with plan.   MDM   Final diagnoses:  Rectal pain  Hematochezia   Patient with rectal pain and bleeding -- h/o UC but this pain is different. Progressively worsening. Non-toxic, afebrile. Hgb normal. No s/s of anemia. Very tender on exam. Some leukocytosis but without fever and risk factors, doubt rectal abscess. Do not feel CT is indicated at this time. Pt has GI f/u, contact, and they will follow-up tomorrow. Pain control in meantime,  appropriate return precautions discussed with patient.   No dangerous or life-threatening conditions suspected or identified by history, physical exam, and by work-up. No indications for hospitalization identified.      Carlisle Cater, PA-C 09/25/13 [phone]

## 2013-09-25 NOTE — ED Notes (Signed)
Pt reports pain and rectal bleeding x 2 weeks.  Pt states she initially thought it was related to hemorrhoids.  Pt states it has gotten progressively worse and pt states she is in agony.  Pt also reports she appears to be passing "masses" of blood.  Pt states she feels an urge to have a BM and sometimes there is not a BM, but just a mass of blood.  Pt states she has been taking ibuprofen with little relief.

## 2013-09-25 NOTE — Discharge Instructions (Signed)
Please read and follow all provided instructions.  Your diagnoses today include:  1. Rectal pain   2. Hematochezia     Tests performed today include:  Vital signs. See below for your results today.   Medications prescribed:   Vicodin (hydrocodone/acetaminophen) - narcotic pain medication  DO NOT drive or perform any activities that require you to be awake and alert because this medicine can make you drowsy. BE VERY CAREFUL not to take multiple medicines containing Tylenol (also called acetaminophen). Doing so can lead to an overdose which can damage your liver and cause liver failure and possibly death.  Take any prescribed medications only as directed.  Home care instructions:  Follow any educational materials contained in this packet.  BE VERY CAREFUL not to take multiple medicines containing Tylenol (also called acetaminophen). Doing so can lead to an overdose which can damage your liver and cause liver failure and possibly death.   Follow-up instructions: I spoke with Dr. Henrene Pastor on the phone today. He requests that you call the office tomorrow and schedule an appointment to be seen.    Return instructions:   Please return to the Emergency Department if you experience worsening symptoms.   Return with fever, worsening pain, vomiting.   Please return if you have any other emergent concerns.  Additional Information:  Your vital signs today were: BP 136/78   Pulse 97   Temp(Src) 97.5 F (36.4 C) (Oral)   Resp 16   Ht 5' 6"  (1.676 m)   Wt 154 lb 1 oz (69.882 kg)   BMI 24.88 kg/m2   SpO2 97% If your blood pressure (BP) was elevated above 135/85 this visit, please have this repeated by your doctor within one month. --------------

## 2013-09-26 ENCOUNTER — Telehealth: Payer: Self-pay | Admitting: Internal Medicine

## 2013-09-26 ENCOUNTER — Telehealth: Payer: Self-pay | Admitting: *Deleted

## 2013-09-26 NOTE — Telephone Encounter (Signed)
Reviewed and agree.

## 2013-09-26 NOTE — Telephone Encounter (Signed)
Appointment time is double booked. Left message for patient to call me to change her time.

## 2013-09-26 NOTE — Telephone Encounter (Signed)
Patient went to ED over the weekend for rectal bleeding and rectal pain. She reports she has been having rectal bleeding for several weeks. She has been using OTC medications and Ibuprofen for pain. On Sunday, she had rectal pain and sent to ED. ED consulted Dr. Gwendolyn Grant call) and patient was given rx for medication and pain medication and told to call for OV this week. Scheduled with Nicoletta Ba, PA on 09/27/13 at 1:30 PM.

## 2013-09-26 NOTE — Telephone Encounter (Signed)
Spoke with patient and moved her OV to 10:30 AM on 09/27/13.

## 2013-09-27 ENCOUNTER — Inpatient Hospital Stay (HOSPITAL_COMMUNITY)
Admission: AD | Admit: 2013-09-27 | Discharge: 2013-09-29 | DRG: 348 | Disposition: A | Discharge status: Home / Self Care | Payer: Medicare HMO | Source: Ambulatory Visit | Attending: Surgery | Admitting: Surgery

## 2013-09-27 ENCOUNTER — Inpatient Hospital Stay (HOSPITAL_COMMUNITY): Payer: Medicare HMO

## 2013-09-27 ENCOUNTER — Ambulatory Visit (INDEPENDENT_AMBULATORY_CARE_PROVIDER_SITE_OTHER): Payer: Medicare HMO | Admitting: Physician Assistant

## 2013-09-27 ENCOUNTER — Ambulatory Visit (INDEPENDENT_AMBULATORY_CARE_PROVIDER_SITE_OTHER): Payer: Commercial Managed Care - HMO | Admitting: Surgery

## 2013-09-27 ENCOUNTER — Encounter: Payer: Self-pay | Admitting: Physician Assistant

## 2013-09-27 ENCOUNTER — Encounter (INDEPENDENT_AMBULATORY_CARE_PROVIDER_SITE_OTHER): Payer: Self-pay | Admitting: Surgery

## 2013-09-27 ENCOUNTER — Ambulatory Visit: Payer: Commercial Managed Care - HMO | Admitting: Physician Assistant

## 2013-09-27 ENCOUNTER — Encounter (HOSPITAL_COMMUNITY): Payer: Self-pay | Admitting: Surgery

## 2013-09-27 VITALS — BP 128/70 | HR 86 | Temp 99.8°F | Resp 16 | Ht 66.0 in | Wt 154.2 lb

## 2013-09-27 VITALS — BP 92/60 | HR 84 | Ht 66.0 in | Wt 154.2 lb

## 2013-09-27 DIAGNOSIS — K5190 Ulcerative colitis, unspecified, without complications: Secondary | ICD-10-CM

## 2013-09-27 DIAGNOSIS — K612 Anorectal abscess: Principal | ICD-10-CM | POA: Diagnosis present

## 2013-09-27 DIAGNOSIS — K6289 Other specified diseases of anus and rectum: Secondary | ICD-10-CM

## 2013-09-27 DIAGNOSIS — K449 Diaphragmatic hernia without obstruction or gangrene: Secondary | ICD-10-CM | POA: Diagnosis present

## 2013-09-27 DIAGNOSIS — K219 Gastro-esophageal reflux disease without esophagitis: Secondary | ICD-10-CM | POA: Diagnosis present

## 2013-09-27 DIAGNOSIS — K611 Rectal abscess: Secondary | ICD-10-CM | POA: Diagnosis present

## 2013-09-27 DIAGNOSIS — F3289 Other specified depressive episodes: Secondary | ICD-10-CM | POA: Diagnosis present

## 2013-09-27 DIAGNOSIS — Z9089 Acquired absence of other organs: Secondary | ICD-10-CM

## 2013-09-27 DIAGNOSIS — Z800 Family history of malignant neoplasm of digestive organs: Secondary | ICD-10-CM

## 2013-09-27 DIAGNOSIS — Z79899 Other long term (current) drug therapy: Secondary | ICD-10-CM

## 2013-09-27 DIAGNOSIS — F329 Major depressive disorder, single episode, unspecified: Secondary | ICD-10-CM | POA: Diagnosis present

## 2013-09-27 LAB — COMPREHENSIVE METABOLIC PANEL
ALT: 8 U/L (ref 0–35)
AST: 12 U/L (ref 0–37)
Albumin: 3.1 g/dL — ABNORMAL LOW (ref 3.5–5.2)
Alkaline Phosphatase: 100 U/L (ref 39–117)
BUN: 13 mg/dL (ref 6–23)
CALCIUM: 9.4 mg/dL (ref 8.4–10.5)
CO2: 24 meq/L (ref 19–32)
CREATININE: 0.65 mg/dL (ref 0.50–1.10)
Chloride: 102 mEq/L (ref 96–112)
GFR calc non Af Amer: 87 mL/min — ABNORMAL LOW (ref >90)
GLUCOSE: 87 mg/dL (ref 70–99)
Potassium: 4.5 mEq/L (ref 3.7–5.3)
SODIUM: 139 meq/L (ref 137–147)
TOTAL PROTEIN: 7 g/dL (ref 6.0–8.3)
Total Bilirubin: 0.2 mg/dL — ABNORMAL LOW (ref 0.3–1.2)

## 2013-09-27 LAB — CBC WITH DIFFERENTIAL/PLATELET
Basophils Absolute: 0 10*3/uL (ref 0.0–0.1)
Basophils Relative: 0 % (ref 0–1)
EOS ABS: 0.1 10*3/uL (ref 0.0–0.7)
EOS PCT: 1 % (ref 0–5)
HEMATOCRIT: 37.6 % (ref 36.0–46.0)
Hemoglobin: 12.4 g/dL (ref 12.0–15.0)
LYMPHS ABS: 1.5 10*3/uL (ref 0.7–4.0)
LYMPHS PCT: 12 % (ref 12–46)
MCH: 30.4 pg (ref 26.0–34.0)
MCHC: 33 g/dL (ref 30.0–36.0)
MCV: 92.2 fL (ref 78.0–100.0)
MONO ABS: 1.1 10*3/uL — AB (ref 0.1–1.0)
Monocytes Relative: 9 % (ref 3–12)
Neutro Abs: 10.1 10*3/uL — ABNORMAL HIGH (ref 1.7–7.7)
Neutrophils Relative %: 78 % — ABNORMAL HIGH (ref 43–77)
PLATELETS: 325 10*3/uL (ref 150–400)
RBC: 4.08 MIL/uL (ref 3.87–5.11)
RDW: 11.8 % (ref 11.5–15.5)
WBC: 12.9 10*3/uL — AB (ref 4.0–10.5)

## 2013-09-27 LAB — APTT: aPTT: 37 seconds (ref 24–37)

## 2013-09-27 LAB — PROTIME-INR
INR: 1.05 (ref 0.00–1.49)
Prothrombin Time: 13.5 seconds (ref 11.6–15.2)

## 2013-09-27 MED ORDER — MORPHINE SULFATE 2 MG/ML IJ SOLN
2.0000 mg | INTRAMUSCULAR | Status: DC | PRN
  Administered 2013-09-27 – 2013-09-29 (×7): 2 mg via INTRAVENOUS
  Filled 2013-09-27 (×7): qty 1

## 2013-09-27 MED ORDER — ENOXAPARIN SODIUM 40 MG/0.4ML ~~LOC~~ SOLN
40.0000 mg | SUBCUTANEOUS | Status: DC
  Administered 2013-09-27 – 2013-09-28 (×2): 40 mg via SUBCUTANEOUS
  Filled 2013-09-27 (×3): qty 0.4

## 2013-09-27 MED ORDER — ONDANSETRON HCL 4 MG/2ML IJ SOLN: 4.0000 mg | Freq: Four times a day (QID) | INTRAMUSCULAR | Status: DC | PRN

## 2013-09-27 MED ORDER — METRONIDAZOLE 500 MG PO TABS: 500.0000 mg | ORAL_TABLET | Freq: Two times a day (BID) | ORAL | Status: DC

## 2013-09-27 MED ORDER — DEXTROSE IN LACTATED RINGERS 5 % IV SOLN
INTRAVENOUS | Status: DC
  Administered 2013-09-27: 19:00:00 via INTRAVENOUS
  Administered 2013-09-29: 100 mL via INTRAVENOUS

## 2013-09-27 MED ORDER — METRONIDAZOLE IN NACL 5-0.79 MG/ML-% IV SOLN
500.0000 mg | Freq: Three times a day (TID) | INTRAVENOUS | Status: DC
  Administered 2013-09-27 – 2013-09-29 (×6): 500 mg via INTRAVENOUS
  Filled 2013-09-27 (×7): qty 100

## 2013-09-27 MED ORDER — CIPROFLOXACIN HCL 500 MG PO TABS: 500.0000 mg | ORAL_TABLET | Freq: Two times a day (BID) | ORAL | Status: DC

## 2013-09-27 MED ORDER — CIPROFLOXACIN IN D5W 400 MG/200ML IV SOLN
400.0000 mg | Freq: Two times a day (BID) | INTRAVENOUS | Status: DC
  Administered 2013-09-27 – 2013-09-29 (×4): 400 mg via INTRAVENOUS
  Filled 2013-09-27 (×5): qty 200

## 2013-09-27 MED ORDER — IOHEXOL 300 MG/ML  SOLN
100.0000 mL | Freq: Once | INTRAMUSCULAR | Status: AC | PRN
  Administered 2013-09-27: 100 mL via INTRAVENOUS

## 2013-09-27 NOTE — Patient Instructions (Signed)
WE sent prescriptions to [facility].  1. Cipro 500 mg 2. Flagyl 500 mg  We have made you an appointment at [address] [place]. Today 6-2 at 3:30 PM  Arrive at 3:15 Pm.

## 2013-09-27 NOTE — Patient Instructions (Signed)
You will be admitted to hospital.

## 2013-09-27 NOTE — Progress Notes (Signed)
Reviewed and agree.She will need CT scan pelvis/abdomen  With IV and oral contrast independent of surgical treatment. ( with or without I&D)

## 2013-09-27 NOTE — Progress Notes (Signed)
Telephone order received from [facility] MD that patient may have clear liquid diet until midnight and then placed on NPO status, order entered Neta Mends RN 09-27-2013 17:56pm

## 2013-09-27 NOTE — Progress Notes (Signed)
Patient ID: Sara Barnes, female   DOB: September 28, 1940, 73 y.o.   MRN: [id number]  No chief complaint on file.   HPI Sara Barnes is a 73 y.o. female.  Patient sent at request of Dr. Olevia Perches for three-week history of rectal pain, mucoid discharge, low-grade fever and loss of appetite. Patient states she has had a history of ulcerative colitis since her 70s but has never had her colon removed. She is on no medical management. Recent colonoscopy showed stricture at splenic flexure with biopsies consistent with inflammatory bowel disease. There is some concern that she may actually have Crohn's disease. She has significant rectal discomfort, drainage and low-grade fever for last 3 weeks. She was seen today by GI and sent to her office for evaluation HPI  Past Medical History  Diagnosis Date  . Depression   . Hiatal hernia   . GERD (gastroesophageal reflux disease)   . Vitamin B12 deficiency   . Ulcerative colitis   . Benign neoplasm     of skin of trunk  . Pulmonary nodule, right 03/2011    Rt middle lobe 3 mm  . Colitis     Past Surgical History  Procedure Laterality Date  . Abdominal hysterectomy    . Appendectomy    . Breast surgery      Lumpectomy-Left breast  . Breast enhancement surgery      Family History  Problem Relation Age of Onset  . Hypertension    . Prostate cancer Brother 68  . Other Father     Brain Tumor  . Stomach cancer Paternal Grandfather   . Rheum arthritis Mother   . Rheum arthritis Brother   . Emphysema Maternal Grandfather   . Asthma Sister     Social History History  Substance Use Topics  . Smoking status: Never Smoker   . Smokeless tobacco: Never Used  . Alcohol Use: Yes    Allergies  Allergen Reactions  . Sulfonamide Derivatives Nausea And Vomiting, Swelling and Rash       . Codeine Nausea And Vomiting  . Mesalamine     questionable    Current Outpatient Prescriptions  Medication Sig Dispense Refill  . atorvastatin (LIPITOR) 20 MG  tablet Take 10 mg by mouth 2 (two) times a week. *no specific days that patient takes med, only that she takes twice a week*      . ciprofloxacin (CIPRO) 500 MG tablet Take 1 tablet (500 mg total) by mouth 2 (two) times daily.  20 tablet  0  . cyanocobalamin (,VITAMIN B-12,) 1000 MCG/ML injection Inject 1,000 mcg into the muscle every 30 (thirty) days.      Marland Kitchen esomeprazole (NEXIUM) 20 MG capsule Take 20 mg by mouth daily as needed (for acid reflux).      Marland Kitchen FLUoxetine (PROZAC) 20 MG capsule Take 20 mg by mouth daily.      . hydrocortisone (ANUSOL-HC) 25 MG suppository Place 1 suppository (25 mg total) rectally 2 (two) times daily.  12 suppository  0  . ibuprofen (ADVIL,MOTRIN) 200 MG tablet Take 600 mg by mouth every 6 (six) hours as needed for mild pain.      . metroNIDAZOLE (FLAGYL) 500 MG tablet Take 1 tablet (500 mg total) by mouth 2 (two) times daily.  20 tablet  0  . Pramox-PE-Glycerin-Petrolatum (PREPARATION H) 1-0.25-14.4-15 % CREA Place 1 application rectally 2 (two) times daily as needed (for pain).      Marland Kitchen HYDROcodone-acetaminophen (NORCO/VICODIN) 5-325 MG per tablet Take 1-2  tablets every 6 hours as needed for severe pain  8 tablet  0   No current facility-administered medications for this visit.    Review of Systems Review of Systems  Constitutional: Positive for fever, chills and fatigue.  HENT: Negative.   Eyes: Negative.   Respiratory: Negative.   Cardiovascular: Negative.   Gastrointestinal: Positive for rectal pain.  Endocrine: Negative.   Genitourinary: Negative.   Musculoskeletal: Negative.   Skin: Negative.   Allergic/Immunologic: Negative.   Neurological: Negative.   Hematological: Negative.   Psychiatric/Behavioral: Negative.     Blood pressure 128/70, pulse 86, temperature 99.8 F (37.7 C), resp. rate 16, height 5' 6"  (1.676 m), weight 154 lb 3.2 oz (69.945 kg).  Physical Exam Physical Exam  Constitutional: She appears ill.  HENT:  Head: Normocephalic and  atraumatic.  Eyes: Pupils are equal, round, and reactive to light. No scleral icterus.  Neck: Normal range of motion. Neck supple.  Cardiovascular: Normal rate and regular rhythm.   Pulmonary/Chest: Effort normal and breath sounds normal.  Abdominal: Soft. Bowel sounds are normal.  Genitourinary:       Data Reviewed GI notes  Assessment    Three-week history of rectal pain concerning for abscess  History of ulcerative colitis      Plan    Discussed case with Dr. Lucia Gaskins who is on call at [facility] long.  Patient has no external sign of fistula or abscess. On digital examination there is tenderness along the right rectal wall with mucoid drainage. Patient will need exam under anesthesia to exclude pelvic abscess. Will obtain CT scan of pelvis and abdomen tonight. Admit for IV antibiotics, pain control, n.p.o. And IV fluids. We'll check lab work Midwife. Case discussed with Dr. Lucia Gaskins. Keep n.p.o. For possible exam under anesthesia in a.m.       Naylee Frankowski A. Loyce Klasen 09/27/2013, 4:31 PM

## 2013-09-27 NOTE — Progress Notes (Signed)
Subjective:    Patient ID: Sara Barnes, female    DOB: 07/03/1940, 73 y.o.   MRN: [id number]  HPI Sara Barnes is a pleasant 73 year old white female known to Dr. Delfin Barnes with long history of ulcerative colitis initially diagnosed about 30 years ago. She last had colonoscopy in January of 2013 she had some active colitis from 0-60 cm and a stricture at the splenic flexure which was biopsied. Biopsies showed quiescent chronic colitis from the right colon and the road moderately active chronic colitis consistent with IBD bacteria of stricture. Patient has not been on any treatment. She has an intolerance to mesalamine S. She was last seen by Dr. Olevia Barnes in March of 2015 at that time was doing well but had noted some increasing diarrhea. Patient was also having dyspeptic symptoms and 1 it to be worked up before putting on other medications for her colitis. She underwent an upper about ultrasound which was negative and was given a prescription for Bentyl. Patient comes in today stating that she's been feeling sick over the past couple of weeks initially had an ER visit on 09/25/2013 due to rectal bleeding. Labs done on 5/31 show WBC of 14.5 hemoglobin 13.4 hematocrit 40.3 she did not have any imaging done.  Patient says her symptoms started 2-3 weeks ago and are very different from her usual colitis symptoms. She has had onset of rectal pain which has been somewhat progressive and as she states that she is very uncomfortable with sitting or standing and feels best lying down. She has been having chills in the evening and sweats at night but no documented fever. She's been having urgency for bowel movement and  says that she's passing dark red blood and a mucousy type of substance frequently. She says she's not passing blood or mucus with every bowel movement but very frequently. She says often times she'll have an urge for bowel movement and passed just a little bit of blood. She has no complaints of abdominal  pain or cramping no nausea or vomiting but says she just does not feel well.    Review of Systems  Constitutional: Positive for chills, diaphoresis, appetite change and fatigue.  HENT: Negative.   Eyes: Negative.   Respiratory: Negative.   Cardiovascular: Negative.   Gastrointestinal: Positive for anal bleeding and rectal pain.  Endocrine: Negative.   Genitourinary: Negative.   Musculoskeletal: Negative.   Skin: Negative.   Allergic/Immunologic: Negative.   Neurological: Negative.   Hematological: Negative.   Psychiatric/Behavioral: Negative.    Outpatient Prescriptions Prior to Visit  Medication Sig Dispense Refill  . atorvastatin (LIPITOR) 20 MG tablet Take 10 mg by mouth 2 (two) times a week. *no specific days that patient takes med, only that she takes twice a week*      . cyanocobalamin (,VITAMIN B-12,) 1000 MCG/ML injection Inject 1,000 mcg into the muscle every 30 (thirty) days.      Marland Kitchen esomeprazole (NEXIUM) 20 MG capsule Take 20 mg by mouth daily as needed (for acid reflux).      Marland Kitchen FLUoxetine (PROZAC) 20 MG capsule Take 20 mg by mouth daily.      Marland Kitchen HYDROcodone-acetaminophen (NORCO/VICODIN) 5-325 MG per tablet Take 1-2 tablets every 6 hours as needed for severe pain  8 tablet  0  . hydrocortisone (ANUSOL-HC) 25 MG suppository Place 1 suppository (25 mg total) rectally 2 (two) times daily.  12 suppository  0  . ibuprofen (ADVIL,MOTRIN) 200 MG tablet Take 600 mg by mouth every  6 (six) hours as needed for mild pain.      . Pramox-PE-Glycerin-Petrolatum (PREPARATION H) 1-0.25-14.4-15 % CREA Place 1 application rectally 2 (two) times daily as needed (for pain).       No facility-administered medications prior to visit.   Allergies  Allergen Reactions  . Sulfonamide Derivatives Nausea And Vomiting, Swelling and Rash       . Codeine Nausea And Vomiting  . Mesalamine     questionable   Patient Active Problem List   Diagnosis Date Noted  . Ulcerative colitis, unspecified  09/27/2013  . Thoracic back pain 03/01/2013  . Snoring 04/19/2012  . Pulmonary nodule 06/24/2011  . HYPERLIPIDEMIA 03/14/2010  . ARM PAIN, LEFT 03/14/2010  . OSTEOPENIA 01/12/2010  . VITAMIN D DEFICIENCY 12/13/2009  . POSTMENOPAUSAL STATUS 12/13/2009  . SINUSITIS, CHRONIC 12/11/2008  . CHEST PAIN UNSPECIFIED 10/06/2008  . DEPRESSION/ANXIETY 01/20/2008  . IRRITABLE BOWEL SYNDROME 01/20/2008  . BREAST MASS, RIGHT 03/08/2007  . VITAMIN B12 DEFICIENCY 08/12/2006  . HIATAL HERNIA 08/12/2006  . BREAST IMPLANTS, BILATERAL, HX OF 08/12/2006   History  Substance Use Topics  . Smoking status: Never Smoker   . Smokeless tobacco: Never Used  . Alcohol Use: Yes   family history includes Asthma in her sister; Emphysema in her maternal grandfather; Hypertension in an other family member; Other in her father; Prostate cancer (age of onset: 92) in her brother; Rheum arthritis in her brother and mother; Stomach cancer in her paternal grandfather.     Objective:   Physical Exam Well-developed older white female in no acute distress lying on the exam table uncomfortable sitting blood pressure 92/60 pulse 84 height 5 foot 6 weight 154. HEENT; nontraumatic normocephalic EOMI PERRLA sclera anicteric, Supple ;no JVD, Cardiovascular; regular rate and rhythm with S1-S2 no murmur or gallop, Pulmonary; clear bilaterally, Abdomen; soft nontender nondistended bowel sounds are active there is no palpable mass or hepatosplenomegaly, Rectal ;exam external hemorrhoidal tags no obvious fistula she is exquisitely tender on digital exam and there is a firm irregular rate palpable on the right, on anoscopy she has internal hemorrhoids and an inflamed erythematous rounded area which oozed a lot of pus and blood with pressure of the anoscope., Ext; no clubbing cyanosis or edema skin warm dry, Psych; mood and affect appropriate       Assessment & Plan:  #45  73 year old female with long history of ulcerative colitis with  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 Patient on no medical therapy #22-3 week history of progressive rectal pain now associated with fatigue chills diaphoresis and rectal bleeding as well as mucoid drainage from the rectum. Anoscopy is concerning for a rectal abscess and/or fistula with grossly purulent material. Question if patient actually has Crohn's disease. #3 depression anxiety #4 hyperlipidemia  Plan; start Cipro 500 mg twice daily x10 days and Flagyl 500 mg twice daily x10 days Hydrocodone as needed for pain patient has a prescription from her ER visit. We have obtained her appointment to see general surgery later this afternoon as she likely will need I&D. Patient also may need CT scan of the abdomen and pelvis but we'll defer this to surgery as I cannot get this done prior to that appointment. Further plans pending surgical evaluation

## 2013-09-28 ENCOUNTER — Encounter (HOSPITAL_COMMUNITY): Payer: Medicare HMO | Admitting: Anesthesiology

## 2013-09-28 ENCOUNTER — Inpatient Hospital Stay (HOSPITAL_COMMUNITY): Payer: Medicare HMO | Admitting: Anesthesiology

## 2013-09-28 ENCOUNTER — Encounter (HOSPITAL_COMMUNITY): Admission: AD | Disposition: A | Discharge status: Home / Self Care | Payer: Self-pay | Source: Ambulatory Visit

## 2013-09-28 DIAGNOSIS — K449 Diaphragmatic hernia without obstruction or gangrene: Secondary | ICD-10-CM | POA: Diagnosis not present

## 2013-09-28 DIAGNOSIS — K219 Gastro-esophageal reflux disease without esophagitis: Secondary | ICD-10-CM | POA: Diagnosis not present

## 2013-09-28 DIAGNOSIS — K5190 Ulcerative colitis, unspecified, without complications: Secondary | ICD-10-CM | POA: Diagnosis not present

## 2013-09-28 DIAGNOSIS — K612 Anorectal abscess: Secondary | ICD-10-CM

## 2013-09-28 HISTORY — PX: INCISION AND DRAINAGE PERIRECTAL ABSCESS: SHX1804

## 2013-09-28 LAB — CBC
HEMATOCRIT: 37.3 % (ref 36.0–46.0)
HEMOGLOBIN: 12.5 g/dL (ref 12.0–15.0)
MCH: 30.8 pg (ref 26.0–34.0)
MCHC: 33.5 g/dL (ref 30.0–36.0)
MCV: 91.9 fL (ref 78.0–100.0)
Platelets: 346 10*3/uL (ref 150–400)
RBC: 4.06 MIL/uL (ref 3.87–5.11)
RDW: 11.8 % (ref 11.5–15.5)
WBC: 16.8 10*3/uL — ABNORMAL HIGH (ref 4.0–10.5)

## 2013-09-28 SURGERY — INCISION AND DRAINAGE, ABSCESS, PERIRECTAL
Anesthesia: General

## 2013-09-28 MED ORDER — ONDANSETRON HCL 4 MG/2ML IJ SOLN
INTRAMUSCULAR | Status: DC | PRN
  Administered 2013-09-28: 4 mg via INTRAVENOUS

## 2013-09-28 MED ORDER — BUPIVACAINE HCL (PF) 0.25 % IJ SOLN
INTRAMUSCULAR | Status: DC | PRN
  Administered 2013-09-28: 20 mL

## 2013-09-28 MED ORDER — PROMETHAZINE HCL 25 MG/ML IJ SOLN: 6.2500 mg | INTRAMUSCULAR | Status: DC | PRN

## 2013-09-28 MED ORDER — CYANOCOBALAMIN 1000 MCG/ML IJ SOLN
1000.0000 ug | INTRAMUSCULAR | Status: DC
  Administered 2013-09-28: 1000 ug via INTRAMUSCULAR
  Filled 2013-09-28: qty 1

## 2013-09-28 MED ORDER — FENTANYL CITRATE 0.05 MG/ML IJ SOLN
INTRAMUSCULAR | Status: AC
  Filled 2013-09-28: qty 2

## 2013-09-28 MED ORDER — MEPERIDINE HCL 50 MG/ML IJ SOLN: 6.2500 mg | INTRAMUSCULAR | Status: DC | PRN

## 2013-09-28 MED ORDER — PROPOFOL 10 MG/ML IV BOLUS
INTRAVENOUS | Status: DC | PRN
  Administered 2013-09-28: 160 mg via INTRAVENOUS

## 2013-09-28 MED ORDER — BUPIVACAINE HCL (PF) 0.25 % IJ SOLN
INTRAMUSCULAR | Status: AC
  Filled 2013-09-28: qty 30

## 2013-09-28 MED ORDER — LIDOCAINE HCL (CARDIAC) 20 MG/ML IV SOLN
INTRAVENOUS | Status: DC | PRN
  Administered 2013-09-28: 100 mg via INTRAVENOUS

## 2013-09-28 MED ORDER — LIDOCAINE HCL (CARDIAC) 20 MG/ML IV SOLN
INTRAVENOUS | Status: AC
  Filled 2013-09-28: qty 5

## 2013-09-28 MED ORDER — DEXAMETHASONE SODIUM PHOSPHATE 10 MG/ML IJ SOLN
INTRAMUSCULAR | Status: DC | PRN
  Administered 2013-09-28: 10 mg via INTRAVENOUS

## 2013-09-28 MED ORDER — OXYCODONE HCL 5 MG/5ML PO SOLN: 5.0000 mg | Freq: Once | ORAL | Status: DC | PRN

## 2013-09-28 MED ORDER — FLUOXETINE HCL 20 MG PO CAPS
20.0000 mg | ORAL_CAPSULE | Freq: Every day | ORAL | Status: DC
  Administered 2013-09-28 – 2013-09-29 (×2): 20 mg via ORAL
  Filled 2013-09-28 (×3): qty 1

## 2013-09-28 MED ORDER — LACTATED RINGERS IV SOLN
INTRAVENOUS | Status: DC | PRN
  Administered 2013-09-28: 09:00:00 via INTRAVENOUS

## 2013-09-28 MED ORDER — HYDROCODONE-ACETAMINOPHEN 5-325 MG PO TABS
1.0000 | ORAL_TABLET | ORAL | Status: DC | PRN
  Administered 2013-09-29 (×2): 1 via ORAL
  Filled 2013-09-28 (×2): qty 1

## 2013-09-28 MED ORDER — DEXAMETHASONE SODIUM PHOSPHATE 10 MG/ML IJ SOLN
INTRAMUSCULAR | Status: AC
  Filled 2013-09-28: qty 1

## 2013-09-28 MED ORDER — HYDROMORPHONE HCL PF 1 MG/ML IJ SOLN: 0.2500 mg | INTRAMUSCULAR | Status: DC | PRN

## 2013-09-28 MED ORDER — FENTANYL CITRATE 0.05 MG/ML IJ SOLN
INTRAMUSCULAR | Status: DC | PRN
  Administered 2013-09-28 (×2): 50 ug via INTRAVENOUS

## 2013-09-28 MED ORDER — MIDAZOLAM HCL 2 MG/2ML IJ SOLN
INTRAMUSCULAR | Status: AC
  Filled 2013-09-28: qty 2

## 2013-09-28 MED ORDER — MIDAZOLAM HCL 5 MG/5ML IJ SOLN
INTRAMUSCULAR | Status: DC | PRN
  Administered 2013-09-28: 2 mg via INTRAVENOUS

## 2013-09-28 MED ORDER — ONDANSETRON HCL 4 MG/2ML IJ SOLN
INTRAMUSCULAR | Status: AC
  Filled 2013-09-28: qty 2

## 2013-09-28 MED ORDER — PROPOFOL 10 MG/ML IV BOLUS
INTRAVENOUS | Status: AC
  Filled 2013-09-28: qty 20

## 2013-09-28 MED ORDER — SODIUM CHLORIDE 0.9 % IR SOLN
Status: DC | PRN
  Administered 2013-09-28: 1000 mL

## 2013-09-28 MED ORDER — OXYCODONE HCL 5 MG PO TABS: 5.0000 mg | ORAL_TABLET | Freq: Once | ORAL | Status: DC | PRN

## 2013-09-28 SURGICAL SUPPLY — 23 items
BLADE HEX COATED 2.75 (ELECTRODE) ×3 IMPLANT
BLADE SURG 15 STRL LF DISP TIS (BLADE) ×1 IMPLANT
BLADE SURG 15 STRL SS (BLADE) ×3
CANISTER SUCTION 2500CC (MISCELLANEOUS) ×3 IMPLANT
ELECT REM PT RETURN 9FT ADLT (ELECTROSURGICAL) ×3
ELECTRODE REM PT RTRN 9FT ADLT (ELECTROSURGICAL) ×1 IMPLANT
GAUZE SPONGE 4X4 16PLY XRAY LF (GAUZE/BANDAGES/DRESSINGS) ×3 IMPLANT
GLOVE BIOGEL PI IND STRL 7.0 (GLOVE) ×1 IMPLANT
GLOVE BIOGEL PI INDICATOR 7.0 (GLOVE) ×2
GLOVE SURG SIGNA 7.5 PF LTX (GLOVE) ×3 IMPLANT
GOWN SPEC L4 XLG W/TWL (GOWN DISPOSABLE) ×6 IMPLANT
GOWN STRL REUS W/TWL LRG LVL3 (GOWN DISPOSABLE) ×3 IMPLANT
KIT BASIN OR (CUSTOM PROCEDURE TRAY) ×3 IMPLANT
NEEDLE HYPO 25X1 1.5 SAFETY (NEEDLE) IMPLANT
PACK LITHOTOMY IV (CUSTOM PROCEDURE TRAY) ×3 IMPLANT
PENCIL BUTTON HOLSTER BLD 10FT (ELECTRODE) ×3 IMPLANT
SOL PREP PROV IODINE SCRUB 4OZ (MISCELLANEOUS) ×3 IMPLANT
SPONGE GAUZE 4X4 12PLY (GAUZE/BANDAGES/DRESSINGS) ×3 IMPLANT
SWAB COLLECTION DEVICE MRSA (MISCELLANEOUS) ×3 IMPLANT
SYR CONTROL 10ML LL (SYRINGE) IMPLANT
TOWEL OR 17X26 10 PK STRL BLUE (TOWEL DISPOSABLE) ×3 IMPLANT
UNDERPAD 30X30 INCONTINENT (UNDERPADS AND DIAPERS) ×3 IMPLANT
YANKAUER SUCT BULB TIP 10FT TU (MISCELLANEOUS) ×3 IMPLANT

## 2013-09-28 NOTE — Progress Notes (Signed)
No drainage noted from perineal area or rectum- no dressing.

## 2013-09-28 NOTE — Transfer of Care (Signed)
Immediate Anesthesia Transfer of Care Note  Patient: Sara Barnes  Procedure(s) Performed: Procedure(s): IRRIGATION AND DEBRIDEMENT PERINEAL ABSCESS, proctoscopy (N/A)  Patient Location: PACU  Anesthesia Type:General  Level of Consciousness: sedated  Airway & Oxygen Therapy: Patient Spontanous Breathing and Patient connected to face mask oxygen  Post-op Assessment: Report given to PACU RN and Post -op Vital signs reviewed and stable  Post vital signs: Reviewed and stable  Complications: No apparent anesthesia complications

## 2013-09-28 NOTE — Care Management Note (Signed)
    Page 1 of 1   09/28/2013     1:39:32 PM CARE MANAGEMENT NOTE 09/28/2013  Patient:  Sara Barnes, Sara Barnes   Account Number:  [id number]  Date Initiated:  09/28/2013  Documentation initiated by:  Sunday Spillers  Subjective/Objective Assessment:   73 yo female admitted with rectal pain, s/p I&D rectal abscess. PTA lived at home alone.     Action/Plan:   Home when stable   Anticipated DC Date:  10/01/2013   Anticipated DC Plan:  [place]  CM consult      Choice offered to / List presented to:             Status of service:  Completed, signed off Medicare Important Message given?   (If response is "NO", the following Medicare IM given date fields will be blank) Date Medicare IM given:   Date Additional Medicare IM given:    Discharge Disposition:  HOME/SELF CARE  Per UR Regulation:  Reviewed for med. necessity/level of care/duration of stay  If discussed at [place] of Stay Meetings, dates discussed:    Comments:

## 2013-09-28 NOTE — Anesthesia Postprocedure Evaluation (Signed)
Anesthesia Post Note  Patient: Sara Barnes  Procedure(s) Performed: Procedure(s) (LRB): IRRIGATION AND DEBRIDEMENT PERIANAL ABSCESS, proctoscopy (N/A)  Anesthesia type: General  Patient location: PACU  Post pain: Pain level controlled  Post assessment: Post-op Vital signs reviewed  Last Vitals: BP 94/53  Pulse 71  Temp(Src) 36.3 C (Oral)  Resp 16  Ht 5' 6"  (1.676 m)  Wt 154 lb 3.2 oz (69.945 kg)  BMI 24.90 kg/m2  SpO2 96%  Post vital signs: Reviewed  Level of consciousness: sedated  Complications: No apparent anesthesia complications

## 2013-09-28 NOTE — Progress Notes (Signed)
No perineal or rectal drainage noted-no dressing

## 2013-09-28 NOTE — Anesthesia Preprocedure Evaluation (Signed)
Anesthesia Evaluation  Patient identified by MRN, date of birth, ID band Patient awake    Reviewed: Allergy & Precautions, H&P , NPO status , Patient's Chart, lab work & pertinent test results  Airway Mallampati: II TM Distance: >3 FB Neck ROM: Full    Dental   Pulmonary neg pulmonary ROS,          Cardiovascular negative cardio ROS  Rhythm:Regular Rate:Normal     Neuro/Psych PSYCHIATRIC DISORDERS Depression    GI/Hepatic Neg liver ROS, hiatal hernia, PUD, GERD-  ,  Endo/Other  negative endocrine ROS  Renal/GU negative Renal ROS     Musculoskeletal negative musculoskeletal ROS (+)   Abdominal   Peds  Hematology negative hematology ROS (+)   Anesthesia Other Findings   Reproductive/Obstetrics negative OB ROS                           Anesthesia Physical Anesthesia Plan  ASA: II  Anesthesia Plan: General   Post-op Pain Management:    Induction: Intravenous  Airway Management Planned: LMA and Oral ETT  Additional Equipment:   Intra-op Plan:   Post-operative Plan: Extubation in OR  Informed Consent: I have reviewed the patients History and Physical, chart, labs and discussed the procedure including the risks, benefits and alternatives for the proposed anesthesia with the patient or authorized representative who has indicated his/her understanding and acceptance.   Dental advisory given  Plan Discussed with: CRNA  Anesthesia Plan Comments:         Anesthesia Quick Evaluation

## 2013-09-28 NOTE — Progress Notes (Signed)
General Surgery Note  LOS: 1 day  POD -  Day of Surgery  Assessment/Plan: 1. Perianal abscess - CT scan last PM does not show supralavator extension -   Plan IRRIGATION AND DEBRIDEMENT PERINEAL ABSCESS later this AM.  On Cipro/Flagyl - 09/27/2013 >>>  I explained the surgery, to drain the abscess and examine the rectum.  Risks include bleeding and nerve injury.  Sister, Shary Decamp, in room with patient.  2.  History of UC (diagnosed when she was 73 yo) - gets colonoscopies about every 2 years by Dr. Olevia Perches  3.  She has had more diarrhea the last few months than normal.  The reason for the change is unclear. 4.  DVT prophylaxis - on hold for surgery   Active Problems:   Rectal pain  Subjective:  Rectal pain no better this AM.  Sister in room with patient.  She is widowed, has one son, Forrest.  Objective:   Filed Vitals:   09/28/13 0805  BP: 124/67  Pulse: 83  Temp: 97.6 F (36.4 C)  Resp: 18     Intake/Output from previous day:  06/02 0701 - 06/03 0700 In: 1133.3 [I.V.:1133.3] Out: 1950 [Urine:1950]  Intake/Output this shift:      Physical Exam:   General: WN older WF who is alert and oriented.    HEENT: Normal. Pupils equal. .   Lungs: Clear   Abdomen: Soft.     Lab Results:    Recent Labs  09/27/13 1812 09/28/13 0403  WBC 12.9* 16.8*  HGB 12.4 12.5  HCT 37.6 37.3  PLT 325 346    BMET   Recent Labs  09/25/13 1300 09/27/13 1812  NA 135* 139  K 3.9 4.5  CL 99 102  CO2 20 24  GLUCOSE 113* 87  BUN 11 13  CREATININE 0.72 0.65  CALCIUM 9.6 9.4    PT/INR   Recent Labs  09/27/13 1812  LABPROT 13.5  INR 1.05    ABG  No results found for this basename: PHART, PCO2, PO2, HCO3,  in the last 72 hours   Studies/Results:  Ct Abdomen Pelvis W Contrast  09/28/2013   CLINICAL DATA:  Rectal and pelvic pain. History of ulcerative colitis.  EXAM: CT ABDOMEN AND PELVIS WITH CONTRAST  TECHNIQUE: Multidetector CT imaging of the abdomen and pelvis was  performed using the standard protocol following bolus administration of intravenous contrast.  CONTRAST:  174m OMNIPAQUE IOHEXOL 300 MG/ML  SOLN  COMPARISON:  04/15/2011  FINDINGS: BODY WALL: Partially imaged breast augmentation.  LOWER CHEST: Small hiatal hernia.  ABDOMEN/PELVIS:  Liver: No focal abnormality.  Biliary: No evidence of biliary obstruction or stone.  Pancreas: Unremarkable.  Spleen: Unremarkable.  Adrenals: Unremarkable.  Kidneys and ureters: No hydronephrosis or stone.  Bladder: Unremarkable.  Reproductive: Hysterectomy.  The ovaries are unremarkable.  Bowel: Active colitis with bowel wall thickening best seen in the transverse colon and splenic flexure region. There is enlargement of the colonic mesenteric vessels with diffuse small colonic mesenteric a nodes. There is a U shaped thick walled collection centered on the posterior anus, with internal gas. The fluid component measures 3 cm x 2.5 x 1.5 cm. There is no abscess above the levator musculature. No bowel obstruction.  Retroperitoneum: Diffuse colonic mesenteric adenopathy as above. This is considered reactive given stability and diffuse pattern.  Peritoneum: No free fluid or gas.  Vascular: No acute findings. Notable noncalcified atherosclerotic plaque along the proximal SMA.  OSSEOUS: No acute findings.  No sacroiliitis.  IMPRESSION: 1. 3 x 2.5 x 1.5 cm posterior perianal abscess. Note supralevator extension. 2. Active colitis.   Electronically Signed   By: Jorje Guild M.D.   On: 09/28/2013 00:02     Anti-infectives:   Anti-infectives   Start     Dose/Rate Route Frequency Ordered Stop   09/27/13 2000  metroNIDAZOLE (FLAGYL) IVPB 500 mg     500 mg 100 mL/hr over 60 Minutes Intravenous 3 times per day 09/27/13 1741     09/27/13 1800  ciprofloxacin (CIPRO) IVPB 400 mg     400 mg 200 mL/hr over 60 Minutes Intravenous Every 12 hours 09/27/13 1741        [name], MD, FACS Pager: [facility] Office:  [phone] 09/28/2013

## 2013-09-29 ENCOUNTER — Encounter (HOSPITAL_COMMUNITY): Payer: Self-pay | Admitting: Surgery

## 2013-09-29 DIAGNOSIS — K611 Rectal abscess: Secondary | ICD-10-CM | POA: Diagnosis present

## 2013-09-29 MED ORDER — HYDROCODONE-ACETAMINOPHEN 5-325 MG PO TABS: 0.5000 | ORAL_TABLET | Freq: Four times a day (QID) | ORAL | Status: DC | PRN

## 2013-09-29 MED ORDER — CIPROFLOXACIN HCL 500 MG PO TABS: 500.0000 mg | ORAL_TABLET | Freq: Two times a day (BID) | ORAL | Status: DC

## 2013-09-29 MED ORDER — METRONIDAZOLE 500 MG PO TABS: 500.0000 mg | ORAL_TABLET | Freq: Three times a day (TID) | ORAL | Status: DC

## 2013-09-29 NOTE — Discharge Instructions (Signed)
Sitz Bath A sitz bath is a warm water (with epsom salts) bath taken in the sitting position that covers only the hips and buttocks. It may be used for either healing or hygiene purposes. Sitz baths are also used to relieve pain, itching, or muscle spasms. The water may contain medicine. Moist heat will help you heal and relax.  HOME CARE INSTRUCTIONS  Take 3 to 4 sitz baths a day. 1. Fill the bathtub half full with warm water. 2. Sit in the water and open the drain a little. 3. Turn on the warm water to keep the tub half full. Keep the water running constantly. 4. Soak in the water for 15 to 20 minutes. 5. After the sitz bath, pat the affected area dry first. SEEK MEDICAL CARE IF:  You get worse instead of better. Stop the sitz baths if you get worse. MAKE SURE YOU:  Understand these instructions.  Will watch your condition.  Will get help right away if you are not doing well or get worse. Document Released: 01/05/2004 Document Revised: 01/07/2012 Document Reviewed: 07/12/2010 [facility] Patient Information 2014 [place], [place].

## 2013-09-29 NOTE — Progress Notes (Signed)
1 Day Post-Op  Subjective: Pt c/o pain which started around 3am.  No N/V tolerating regular diet.  Nervous about taking the narcotics.  Doesn't feel ready to go home today.  Afebrile.  Objective: Vital signs in last 24 hours: Temp:  [97.3 F (36.3 C)-98.4 F (36.9 C)] 97.7 F (36.5 C) (06/04 0546) Pulse Rate:  [65-83] 65 (06/04 0546) Resp:  [12-18] 18 (06/04 0546) BP: (94-119)/(53-67) 108/65 mmHg (06/04 0546) SpO2:  [93 %-100 %] 97 % (06/04 0546)    Intake/Output from previous day: 06/03 0701 - 06/04 0700 In: 3000 [I.V.:3000] Out: 2550 [Urine:2550] Intake/Output this shift:    PE: Gen:  Alert, NAD, pleasant Peri-rectum:  No significant erythema/edema or induration, no frank purulence on dressing pad   Lab Results:   Recent Labs  09/27/13 1812 09/28/13 0403  WBC 12.9* 16.8*  HGB 12.4 12.5  HCT 37.6 37.3  PLT 325 346   BMET  Recent Labs  09/27/13 1812  NA 139  K 4.5  CL 102  CO2 24  GLUCOSE 87  BUN 13  CREATININE 0.65  CALCIUM 9.4   PT/INR  Recent Labs  09/27/13 1812  LABPROT 13.5  INR 1.05   CMP     Component Value Date/Time   NA 139 09/27/2013 1812   K 4.5 09/27/2013 1812   CL 102 09/27/2013 1812   CO2 24 09/27/2013 1812   GLUCOSE 87 09/27/2013 1812   BUN 13 09/27/2013 1812   CREATININE 0.65 09/27/2013 1812   CALCIUM 9.4 09/27/2013 1812   PROT 7.0 09/27/2013 1812   ALBUMIN 3.1* 09/27/2013 1812   AST 12 09/27/2013 1812   ALT 8 09/27/2013 1812   ALKPHOS 100 09/27/2013 1812   BILITOT 0.2* 09/27/2013 1812   GFRNONAA 87* 09/27/2013 1812   GFRAA >90 09/27/2013 1812   Lipase     Component Value Date/Time   LIPASE 31 04/15/2011 1700       Studies/Results: Ct Abdomen Pelvis W Contrast  09/28/2013   ADDENDUM REPORT: 09/28/2013 20:56  ADDENDUM: Please note the impression section should read,  IMPRESSION:  1. 3 x 2.5 x 1.5 cm posterior perianal abscess. NO supralevator  extension.  2. Active colitis.   Electronically Signed   By: Jorje Guild M.D.   On: 09/28/2013  20:56   09/28/2013   CLINICAL DATA:  Rectal and pelvic pain. History of ulcerative colitis.  EXAM: CT ABDOMEN AND PELVIS WITH CONTRAST  TECHNIQUE: Multidetector CT imaging of the abdomen and pelvis was performed using the standard protocol following bolus administration of intravenous contrast.  CONTRAST:  1101m OMNIPAQUE IOHEXOL 300 MG/ML  SOLN  COMPARISON:  04/15/2011  FINDINGS: BODY WALL: Partially imaged breast augmentation.  LOWER CHEST: Small hiatal hernia.  ABDOMEN/PELVIS:  Liver: No focal abnormality.  Biliary: No evidence of biliary obstruction or stone.  Pancreas: Unremarkable.  Spleen: Unremarkable.  Adrenals: Unremarkable.  Kidneys and ureters: No hydronephrosis or stone.  Bladder: Unremarkable.  Reproductive: Hysterectomy.  The ovaries are unremarkable.  Bowel: Active colitis with bowel wall thickening best seen in the transverse colon and splenic flexure region. There is enlargement of the colonic mesenteric vessels with diffuse small colonic mesenteric a nodes. There is a U shaped thick walled collection centered on the posterior anus, with internal gas. The fluid component measures 3 cm x 2.5 x 1.5 cm. There is no abscess above the levator musculature. No bowel obstruction.  Retroperitoneum: Diffuse colonic mesenteric adenopathy as above. This is considered reactive given stability and diffuse pattern.  Peritoneum: No free fluid or gas.  Vascular: No acute findings. Notable noncalcified atherosclerotic plaque along the proximal SMA.  OSSEOUS: No acute findings.  No sacroiliitis.  IMPRESSION: 1. 3 x 2.5 x 1.5 cm posterior perianal abscess. Note supralevator extension. 2. Active colitis.  Electronically Signed: By: Jorje Guild M.D. On: 09/28/2013 00:02    Anti-infectives: Anti-infectives   Start     Dose/Rate Route Frequency Ordered Stop   09/27/13 2000  metroNIDAZOLE (FLAGYL) IVPB 500 mg     500 mg 100 mL/hr over 60 Minutes Intravenous 3 times per day 09/27/13 1741     09/27/13 1800   ciprofloxacin (CIPRO) IVPB 400 mg     400 mg 200 mL/hr over 60 Minutes Intravenous Every 12 hours 09/27/13 1741         Assessment/Plan 1. Perianal abscess - CT scan last PM does not show supralavator extension - POD #1 s/p I&D of posterior perianal abscess  On Cipro/Flagyl - 09/27/2013 >>> will need 1 week abx upon discharge 2. History of UC (diagnosed when she was 73 yo) - gets colonoscopies about every 2 years by Dr. Olevia Perches  3. She has had more diarrhea the last few months than normal. The reason for the change is unclear.  4. DVT prophylaxis - on hold for surgery  Plan:  1.  The patient is in a lot of pain, because she was hesitant to take the narcotics. 2.  Pending sitz baths/shower to wash out the rectum 3.  May need stool softeners if stays constipated 4.  Ambulate and IS 5.  SCD's and lovenox 6.  Plan on discharge either tonight or tomorrow am    LOS: 2 days    Coralie Keens 09/29/2013, 9:04 AM Pager: [phone]  Agree with above. Feels worse today - probably because local wore off.  [name], MD, [facility] Pager: [phone] Office phone:  [phone]

## 2013-09-29 NOTE — Discharge Summary (Signed)
[name]   Patient ID: Sara Barnes MRN: [id number] DOB/AGE: 1940-12-16 73 y.o.  Admit date: 09/27/2013 Discharge date: 09/29/2013  Admitting Diagnosis: Posterior perirectal abscess Long standing ulcerative colitis - followed by Dr. Olevia Perches  Discharge Diagnosis Patient Active Problem List   Diagnosis Date Noted  . Perirectal abscess 09/29/2013  . Ulcerative colitis, unspecified 09/27/2013  . Rectal pain 09/27/2013  . Thoracic back pain 03/01/2013  . Snoring 04/19/2012  . Pulmonary nodule 06/24/2011  . HYPERLIPIDEMIA 03/14/2010  . ARM PAIN, LEFT 03/14/2010  . OSTEOPENIA 01/12/2010  . VITAMIN D DEFICIENCY 12/13/2009  . POSTMENOPAUSAL STATUS 12/13/2009  . SINUSITIS, CHRONIC 12/11/2008  . CHEST PAIN UNSPECIFIED 10/06/2008  . DEPRESSION/ANXIETY 01/20/2008  . IRRITABLE BOWEL SYNDROME 01/20/2008  . BREAST MASS, RIGHT 03/08/2007  . VITAMIN B12 DEFICIENCY 08/12/2006  . HIATAL HERNIA 08/12/2006  . BREAST IMPLANTS, BILATERAL, HX OF 08/12/2006    Consultants None  Imaging: Ct Abdomen Pelvis W Contrast  09/28/2013   ADDENDUM REPORT: 09/28/2013 20:56  ADDENDUM: Please note the impression section should read,  IMPRESSION:  1. 3 x 2.5 x 1.5 cm posterior perianal abscess. NO supralevator  extension.  2. Active colitis.   Electronically Signed   By: Jorje Guild M.D.   On: 09/28/2013 20:56   09/28/2013   CLINICAL DATA:  Rectal and pelvic pain. History of ulcerative colitis.  EXAM: CT ABDOMEN AND PELVIS WITH CONTRAST  TECHNIQUE: Multidetector CT imaging of the abdomen and pelvis was performed using the standard protocol following bolus administration of intravenous contrast.  CONTRAST:  153m OMNIPAQUE IOHEXOL 300 MG/ML  SOLN  COMPARISON:  04/15/2011  FINDINGS: BODY WALL: Partially imaged breast augmentation.  LOWER CHEST: Small hiatal hernia.  ABDOMEN/PELVIS:  Liver: No focal abnormality.  Biliary: No evidence of biliary obstruction or stone.  Pancreas:  Unremarkable.  Spleen: Unremarkable.  Adrenals: Unremarkable.  Kidneys and ureters: No hydronephrosis or stone.  Bladder: Unremarkable.  Reproductive: Hysterectomy.  The ovaries are unremarkable.  Bowel: Active colitis with bowel wall thickening best seen in the transverse colon and splenic flexure region. There is enlargement of the colonic mesenteric vessels with diffuse small colonic mesenteric a nodes. There is a U shaped thick walled collection centered on the posterior anus, with internal gas. The fluid component measures 3 cm x 2.5 x 1.5 cm. There is no abscess above the levator musculature. No bowel obstruction.  Retroperitoneum: Diffuse colonic mesenteric adenopathy as above. This is considered reactive given stability and diffuse pattern.  Peritoneum: No free fluid or gas.  Vascular: No acute findings. Notable noncalcified atherosclerotic plaque along the proximal SMA.  OSSEOUS: No acute findings.  No sacroiliitis.  IMPRESSION: 1. 3 x 2.5 x 1.5 cm posterior perianal abscess. Note supralevator extension. 2. Active colitis.  Electronically Signed: By: JJorje GuildM.D. On: 09/28/2013 00:02    Procedures Dr. NLucia Gaskins(09/28/13) - Rigid sigmoidoscopy to 20 cm. and incision and drainage of posterior perianal abscess.    Hospital Course:  73y.o. female. patient sent at request of Dr. BOlevia Perchesfor three-week history of rectal pain, mucoid discharge, low-grade fever and loss of appetite. Patient states she has had a history of ulcerative colitis since her 89sbut has never had her colon removed. She is on no medical management. Recent colonoscopy showed stricture at splenic flexure with biopsies consistent with inflammatory bowel disease. There is some concern that she may actually have Crohn's disease. She has significant rectal discomfort, drainage and low-grade fever for last 3 weeks. She  was seen today by GI and sent to her office for evaluation  She was found to have a posterior perirectal abscess.   Patient was admitted and underwent procedure listed above.  Tolerated procedure well and was transferred to the floor.  Diet was advanced as tolerated.  On POD #1, the patient was voiding well, tolerating diet, ambulating well, pain well controlled, vital signs stable, incisions c/d/i and felt stable for discharge home.  Patient will follow up in our office in 2-3 weeks and knows to call with questions or concerns.     Medication List         atorvastatin 20 MG tablet  Commonly known as:  LIPITOR  Take 10 mg by mouth 2 (two) times a week. *no specific days that patient takes med, only that she takes twice a week*     ciprofloxacin 500 MG tablet  Commonly known as:  CIPRO  Take 1 tablet (500 mg total) by mouth 2 (two) times daily.     cyanocobalamin 1000 MCG/ML injection  Commonly known as:  (VITAMIN B-12)  Inject 1,000 mcg into the muscle every 30 (thirty) days.     esomeprazole 20 MG capsule  Commonly known as:  NEXIUM  Take 20 mg by mouth daily as needed (for acid reflux).     FLUoxetine 20 MG capsule  Commonly known as:  PROZAC  Take 20 mg by mouth daily.     HYDROcodone-acetaminophen 5-325 MG per tablet  Commonly known as:  NORCO/VICODIN  Take 0.5-2 tablets by mouth every 6 (six) hours as needed for moderate pain.     hydrocortisone 25 MG suppository  Commonly known as:  ANUSOL-HC  Place 1 suppository (25 mg total) rectally 2 (two) times daily.     ibuprofen 200 MG tablet  Commonly known as:  ADVIL,MOTRIN  Take 400 mg by mouth every 6 (six) hours as needed for mild pain (pain).     metroNIDAZOLE 500 MG tablet  Commonly known as:  FLAGYL  Take 1 tablet (500 mg total) by mouth 3 (three) times daily.     PREPARATION H 1-0.25-14.4-15 % Crea  Generic drug:  Pramox-PE-Glycerin-Petrolatum  Place 1 application rectally 2 (two) times daily as needed (for pain).        Follow-up Information   Follow up with [facility], MD. Schedule an appointment as soon as possible  for a visit in 2 weeks. (For post-operation check)    Specialty:  General Surgery   Contact information:   [place] [address] [phone]      Signed: Excell Seltzer [facility] [phone]  09/29/2013, 2:31 PM  Agree with above.  [name], MD, [facility] Pager: [phone] Office phone:  [phone]

## 2013-09-29 NOTE — Op Note (Signed)
NAMEMERTICE, UFFELMAN NO.:  [id number]  MEDICAL RECORD NO.:  [id number]  LOCATION:  5248                         FACILITY:  [facility]  PHYSICIAN:  Fenton Malling. Lucia Gaskins, M.D.  DATE OF BIRTH:  12/03/1940  DATE OF PROCEDURE:  09/28/2013                              OPERATIVE REPORT   PREOPERATIVE DIAGNOSIS:  Posterior perianal abscess.  POSTOPERATIVE DIAGNOSIS:  Posterior perianal abscess.  PROCEDURE:  Rigid sigmoidoscopy to 20 cm.  An incision and drainage of posterior perianal abscess.  SURGEON:  Fenton Malling. Lucia Gaskins, M.D.  ANESTHESIA:  General endotracheal supervised by Dr. Nolon Nations.  ESTIMATED BLOOD LOSS:  Minimal.  Local use was 20 mL of 0.25% Marcaine plain.  COMPLICATIONS:  None.  INDICATION FOR PROCEDURE:  Ms. Plancarte is a 73 year old white female, who sees Dr. [name] as her primary care doctor and Dr. [name] from a gastroenterology standpoint.  She has had longstanding ulcerative colitis, followed by Dr. Olevia Perches, has had a change in her bowel habits with more loose stools over the last 6 months to a year.  Over the last 3 weeks, has had increasing perirectal pain, was seen by Dr. Erroll Luna in our urgent office yesterday who was concerned about a perianal abscess.  A CT scan showed a 3.0 x 2.5 cm posterior perianal abscess.    I discussed with the patient about taking to the operating room for incision and drainage of this abscess.  The risk include bleeding and nerve injury.  OPERATIVE NOTE:  The patient was placed in the lithotomy position, given a general endotracheal anesthetic.  She was already on Cipro and Flagyl as antibiotics.  A time-out was held and surgical checklist run.  I did a rigid sigmoidoscopy to 20 cm and her distal colon looked fairly healthy with no evidence of any mass, ulceration, or tumor.  Then prepped her perineum with Betadine solution and sterilely draped her.  Posteriorly, I got into an abscess which was about 3 to 3.5  cm in diameter.  This was directly posterior, I opened this up between her internal sphincter muscles.  The wound was irrigated.  I then infiltrated 20 mL of 0.25% Marcaine around her rectum in hopes of providing some pain relief postoperatively.  She will continue in the hospital on IV antibiotics.  Start sitz baths.  Fenton Malling. Lucia Gaskins, M.D., FACS   DHN/MEDQ  D:  09/28/2013  T:  09/29/2013  Job:  [id number]  cc:   Rosalita Chessman, DO [address] [place], [place] [address]  Lowella Bandy. Olevia Perches, [place] [address]

## 2013-10-01 LAB — CULTURE, ROUTINE-ABSCESS: Culture: NO GROWTH

## 2013-10-03 LAB — ANAEROBIC CULTURE

## 2013-10-04 ENCOUNTER — Telehealth (INDEPENDENT_AMBULATORY_CARE_PROVIDER_SITE_OTHER): Payer: Self-pay

## 2013-10-04 NOTE — Telephone Encounter (Signed)
Patient appt moved up to 10/12/13 @ 12:15 . (Not allowed to leave voice mail only call back number for patient to call )

## 2013-10-05 NOTE — H&P (Signed)
No chief complaint on file.  HPI  Sara Barnes is a 73 y.o. female. Patient sent at request of Dr. Olevia Perches for three-week history of rectal pain, mucoid discharge, low-grade fever and loss of appetite. Patient states she has had a history of ulcerative colitis since her 30s but has never had her colon removed. She is on no medical management. Recent colonoscopy showed stricture at splenic flexure with biopsies consistent with inflammatory bowel disease. There is some concern that she may actually have Crohn's disease. She has significant rectal discomfort, drainage and low-grade fever for last 3 weeks. She was seen today by GI and sent to her office for evaluation  HPI  Past Medical History   Diagnosis  Date   .  Depression    .  Hiatal hernia    .  GERD (gastroesophageal reflux disease)    .  Vitamin B12 deficiency    .  Ulcerative colitis    .  Benign neoplasm      of skin of trunk   .  Pulmonary nodule, right  03/2011     Rt middle lobe 3 mm   .  Colitis     Past Surgical History   Procedure  Laterality  Date   .  Abdominal hysterectomy     .  Appendectomy     .  Breast surgery       Lumpectomy-Left breast   .  Breast enhancement surgery      Family History   Problem  Relation  Age of Onset   .  Hypertension     .  Prostate cancer  Brother  24   .  Other  Father      Brain Tumor   .  Stomach cancer  Paternal Grandfather    .  Rheum arthritis  Mother    .  Rheum arthritis  Brother    .  Emphysema  Maternal Grandfather    .  Asthma  Sister    Social History  History   Substance Use Topics   .  Smoking status:  Never Smoker   .  Smokeless tobacco:  Never Used   .  Alcohol Use:  Yes    Allergies   Allergen  Reactions   .  Sulfonamide Derivatives  Nausea And Vomiting, Swelling and Rash       .  Codeine  Nausea And Vomiting   .  Mesalamine      questionable    Current Outpatient Prescriptions   Medication  Sig  Dispense  Refill   .  atorvastatin (LIPITOR) 20 MG  tablet  Take 10 mg by mouth 2 (two) times a week. *no specific days that patient takes med, only that she takes twice a week*     .  ciprofloxacin (CIPRO) 500 MG tablet  Take 1 tablet (500 mg total) by mouth 2 (two) times daily.  20 tablet  0   .  cyanocobalamin (,VITAMIN B-12,) 1000 MCG/ML injection  Inject 1,000 mcg into the muscle every 30 (thirty) days.     Marland Kitchen  esomeprazole (NEXIUM) 20 MG capsule  Take 20 mg by mouth daily as needed (for acid reflux).     Marland Kitchen  FLUoxetine (PROZAC) 20 MG capsule  Take 20 mg by mouth daily.     .  hydrocortisone (ANUSOL-HC) 25 MG suppository  Place 1 suppository (25 mg total) rectally 2 (two) times daily.  12 suppository  0   .  ibuprofen (ADVIL,MOTRIN)  200 MG tablet  Take 600 mg by mouth every 6 (six) hours as needed for mild pain.     .  metroNIDAZOLE (FLAGYL) 500 MG tablet  Take 1 tablet (500 mg total) by mouth 2 (two) times daily.  20 tablet  0   .  Pramox-PE-Glycerin-Petrolatum (PREPARATION H) 1-0.25-14.4-15 % CREA  Place 1 application rectally 2 (two) times daily as needed (for pain).     Marland Kitchen  HYDROcodone-acetaminophen (NORCO/VICODIN) 5-325 MG per tablet  Take 1-2 tablets every 6 hours as needed for severe pain  8 tablet  0    No current facility-administered medications for this visit.   Review of Systems  Review of Systems  Constitutional: Positive for fever, chills and fatigue.  HENT: Negative.  Eyes: Negative.  Respiratory: Negative.  Cardiovascular: Negative.  Gastrointestinal: Positive for rectal pain.  Endocrine: Negative.  Genitourinary: Negative.  Musculoskeletal: Negative.  Skin: Negative.  Allergic/Immunologic: Negative.  Neurological: Negative.  Hematological: Negative.  Psychiatric/Behavioral: Negative.  Blood pressure 128/70, pulse 86, temperature 99.8 F (37.7 C), resp. rate 16, height 5' 6"  (1.676 m), weight 154 lb 3.2 oz (69.945 kg).  Physical Exam  Physical Exam  Constitutional: She appears ill.  HENT:  Head: Normocephalic and  atraumatic.  Eyes: Pupils are equal, round, and reactive to light. No scleral icterus.  Neck: Normal range of motion. Neck supple.  Cardiovascular: Normal rate and regular rhythm.  Pulmonary/Chest: Effort normal and breath sounds normal.  Abdominal: Soft. Bowel sounds are normal.  Genitourinary:     Data Reviewed  GI notes  Assessment  Three-week history of rectal pain concerning for abscess  History of ulcerative colitis  Plan  Discussed case with Dr. Lucia Gaskins who is on call at [facility] long. Patient has no external sign of fistula or abscess. On digital examination there is tenderness along the right rectal wall with mucoid drainage. Patient will need exam under anesthesia to exclude pelvic abscess. Will obtain CT scan of pelvis and abdomen tonight. Admit for IV antibiotics, pain control, n.p.o. And IV fluids. We'll check lab work Midwife. Case discussed with Dr. Lucia Gaskins. Keep n.p.o. For possible exam under anesthesia in a.m.  Alana Dayton A. Meghan Warshawsky  09/27/2013, 4:31 PM

## 2013-10-12 ENCOUNTER — Ambulatory Visit (INDEPENDENT_AMBULATORY_CARE_PROVIDER_SITE_OTHER): Payer: Commercial Managed Care - HMO | Admitting: Surgery

## 2013-10-12 ENCOUNTER — Telehealth (INDEPENDENT_AMBULATORY_CARE_PROVIDER_SITE_OTHER): Payer: Self-pay

## 2013-10-12 ENCOUNTER — Encounter (INDEPENDENT_AMBULATORY_CARE_PROVIDER_SITE_OTHER): Payer: Self-pay | Admitting: Surgery

## 2013-10-12 VITALS — BP 110/70 | HR 76 | Resp 14 | Ht 66.0 in | Wt 151.8 lb

## 2013-10-12 DIAGNOSIS — K611 Rectal abscess: Secondary | ICD-10-CM

## 2013-10-12 DIAGNOSIS — K612 Anorectal abscess: Secondary | ICD-10-CM

## 2013-10-12 NOTE — Telephone Encounter (Signed)
V/M for patient to come in early, we will see her when she arrives .

## 2013-10-12 NOTE — Progress Notes (Signed)
[place], MD,  Sara Barnes.,  [place], [place]    [place] Phone:  [phone] FAX:  [phone]   Re:   Sara Barnes DOB:   01/16/1941 MRN:   [id number]  ASSESSMENT AND PLAN: 1.  Posterior perirectal abscess   IRRIGATION AND DEBRIDEMENT of Posterior Perirectal ABSCESS - 09/28/2013 - Sara Barnes  Doing okay, though she is still having pain and discomfort after BM's.  Plan is to do 2 to 3 sitz baths per day.  I'll see her back in 1 month.  2. History of UC (diagnosed when she was 73 yo)  Gets colonoscopies about every 2 years by Dr. Olevia Barnes  3. She has had more diarrhea the last few months than normal. The reason for the change is unclear. 4.  GERD 5.  Depression 6.  Somewhat hard of hearing.  HISTORY OF PRESENT ILLNESS: Chief Complaint  Patient presents with  . perianal abscess    Sara Barnes is a 73 y.o. (DOB: [birth year])  white  female who is a patient of Sara Koyanagi, DO and comes to me today for follow up of a posterior perirectal abscess.  I gave her literature on perirectal abscesses.  I think that she is doing the right thing.  It may take 6 to 8 weeks to heal.  She is still having loose stools.  These predated her perirectal abscess. She is uncomfortable when she does household activities. And she feels tired. She has no set appointment to see Dr. Olevia Barnes back at this time.  Past Medical History  Diagnosis Date  . Depression   . Hiatal hernia   . GERD (gastroesophageal reflux disease)   . Vitamin B12 deficiency   . Ulcerative colitis   . Benign neoplasm     of skin of trunk  . Pulmonary nodule, right 03/2011    Rt middle lobe 3 mm  . Colitis     SOCIAL HISTORY: She is a widow. Sister, Sara Barnes, at hospital with patient. She has one son, Sara Barnes, who lives in [place].  PHYSICAL EXAM: BP 110/70  Pulse 76  Resp 14  Ht 5' 6"  (1.676 m)  Wt 151 lb 12.8 oz (68.856 kg)  BMI 24.51  kg/m2  Rectum:  She still has some pus on physical exam.  But she actually tolerated the rectal exam fairly well.  I do not feel a residual abscess or mass. She does not have much rectal spasm  DATA REVIEWED: Epic notes  Sara Overall, MD,  [facility], [address]  [place], [place]    [place] Phone:  [place]:  [phone]

## 2013-10-26 ENCOUNTER — Encounter (INDEPENDENT_AMBULATORY_CARE_PROVIDER_SITE_OTHER): Payer: Commercial Managed Care - HMO | Admitting: Surgery

## 2013-11-23 ENCOUNTER — Encounter (INDEPENDENT_AMBULATORY_CARE_PROVIDER_SITE_OTHER): Payer: Self-pay | Admitting: Surgery

## 2013-11-23 ENCOUNTER — Ambulatory Visit (INDEPENDENT_AMBULATORY_CARE_PROVIDER_SITE_OTHER): Payer: Commercial Managed Care - HMO | Admitting: Surgery

## 2013-11-23 VITALS — BP 118/72 | HR 76 | Temp 97.5°F | Ht 66.0 in | Wt 153.0 lb

## 2013-11-23 DIAGNOSIS — K612 Anorectal abscess: Secondary | ICD-10-CM

## 2013-11-23 DIAGNOSIS — K611 Rectal abscess: Secondary | ICD-10-CM

## 2013-11-23 NOTE — Progress Notes (Signed)
[place], MD,  [address]  [place], [place]    [place] Phone:  [phone] FAX:  [phone]   Re:   Sara Barnes DOB:   [birth year] MRN:   [id number]  ASSESSMENT AND PLAN: 1.  Posterior perirectal abscess   IRRIGATION AND DEBRIDEMENT of Posterior Perirectal ABSCESS - 09/28/2013 - D. Erva Koke  She seems to be improving. She has chronic diarrhea and underlying IBD, both of which will slow down any healing process.  I'll see her back in 2 month.  2. History of UC (diagnosed when she was 73 yo)  Gets colonoscopies about every 2 years by Dr. Olevia Perches  3. She has had more diarrhea the last few months before the abscess than normal. The reason for the change is unclear. 4.  GERD 5.  Depression 6.  Somewhat hard of hearing.  HISTORY OF PRESENT ILLNESS: Chief Complaint  Patient presents with  . Follow-up   Sara Barnes is a 73 y.o. (DOB: [birth year])  white  female who is a patient of [name], DO and comes to me today for follow up of a posterior perirectal abscess. She comes by herself.  She is still having loose stools.  These predated her perirectal abscess. This probably has much to do with her underlying UC.  She has 6 to 10 loose stools per day.  She has more trouble in the AM.  She almost never has formed stools.  She also has some lack of control of her bowel movements.  She is careful with what she eats and knows it affects her bowel movements.  Past Medical History  Diagnosis Date  . Depression   . Hiatal hernia   . GERD (gastroesophageal reflux disease)   . Vitamin B12 deficiency   . Ulcerative colitis   . Benign neoplasm     of skin of trunk  . Pulmonary nodule, right 03/2011    Rt middle lobe 3 mm  . Colitis     SOCIAL HISTORY: She is a widow. Sister, Shary Decamp, at hospital with patient. She has one son, Shea Evans, who lives in [place].  PHYSICAL EXAM: BP 118/72  Pulse 76  Temp(Src)  97.5 F (36.4 C)  Ht 5' 6"  (1.676 m)  Wt 153 lb (69.4 kg)  BMI 24.71 kg/m2  Rectum:  She has a posterior ulcer/fissure.  There is some purulence associated with this.  She tolerates a rectal exam well.  I think this is clearly better than when I last saw her.  I am not sure that if she had better control of her bowel movements, the rectum would heal any more.  DATA REVIEWED: Epic notes  [name], MD,  [facility], [address]  [place], [place]    [place] Phone:  [phone] FAX:  [phone]

## 2014-01-13 ENCOUNTER — Other Ambulatory Visit: Payer: Self-pay | Admitting: Family Medicine

## 2014-01-26 ENCOUNTER — Encounter (INDEPENDENT_AMBULATORY_CARE_PROVIDER_SITE_OTHER): Payer: Self-pay | Admitting: Surgery

## 2014-01-26 ENCOUNTER — Ambulatory Visit (INDEPENDENT_AMBULATORY_CARE_PROVIDER_SITE_OTHER): Payer: Medicare HMO | Admitting: Family Medicine

## 2014-01-26 ENCOUNTER — Encounter: Payer: Self-pay | Admitting: Family Medicine

## 2014-01-26 VITALS — BP 134/82 | HR 71 | Temp 98.2°F | Ht 66.0 in | Wt 155.6 lb

## 2014-01-26 DIAGNOSIS — Z0000 Encounter for general adult medical examination without abnormal findings: Secondary | ICD-10-CM

## 2014-01-26 DIAGNOSIS — E785 Hyperlipidemia, unspecified: Secondary | ICD-10-CM

## 2014-01-26 DIAGNOSIS — Z23 Encounter for immunization: Secondary | ICD-10-CM

## 2014-01-26 LAB — HEPATIC FUNCTION PANEL
ALBUMIN: 4.1 g/dL (ref 3.5–5.2)
ALK PHOS: 92 U/L (ref 39–117)
ALT: 14 U/L (ref 0–35)
AST: 17 U/L (ref 0–37)
BILIRUBIN DIRECT: 0.1 mg/dL (ref 0.0–0.3)
TOTAL PROTEIN: 7.9 g/dL (ref 6.0–8.3)
Total Bilirubin: 0.7 mg/dL (ref 0.2–1.2)

## 2014-01-26 LAB — BASIC METABOLIC PANEL
BUN: 10 mg/dL (ref 6–23)
CALCIUM: 9.4 mg/dL (ref 8.4–10.5)
CO2: 25 mEq/L (ref 19–32)
Chloride: 103 mEq/L (ref 96–112)
Creatinine, Ser: 0.7 mg/dL (ref 0.4–1.2)
GFR: 93.5 mL/min (ref >60.00)
Glucose, Bld: 93 mg/dL (ref 70–99)
Potassium: 4 mEq/L (ref 3.5–5.1)
Sodium: 135 mEq/L (ref 135–145)

## 2014-01-26 LAB — CBC WITH DIFFERENTIAL/PLATELET
BASOS PCT: 0.3 % (ref 0.0–3.0)
Basophils Absolute: 0 10*3/uL (ref 0.0–0.1)
EOS PCT: 0.8 % (ref 0.0–5.0)
Eosinophils Absolute: 0.1 10*3/uL (ref 0.0–0.7)
HCT: 41.6 % (ref 36.0–46.0)
HEMOGLOBIN: 14.4 g/dL (ref 12.0–15.0)
LYMPHS PCT: 23.3 % (ref 12.0–46.0)
Lymphs Abs: 2.1 10*3/uL (ref 0.7–4.0)
MCHC: 34.5 g/dL (ref 30.0–36.0)
MCV: 90.3 fl (ref 78.0–100.0)
MONOS PCT: 4.6 % (ref 3.0–12.0)
Monocytes Absolute: 0.4 10*3/uL (ref 0.1–1.0)
NEUTROS ABS: 6.3 10*3/uL (ref 1.4–7.7)
NEUTROS PCT: 71 % (ref 43.0–77.0)
Platelets: 261 10*3/uL (ref 150.0–400.0)
RBC: 4.6 Mil/uL (ref 3.87–5.11)
RDW: 12.8 % (ref 11.5–15.5)
WBC: 8.9 10*3/uL (ref 4.0–10.5)

## 2014-01-26 LAB — LIPID PANEL
Cholesterol: 195 mg/dL (ref 0–200)
HDL: 51.6 mg/dL (ref >39.00)
NONHDL: 143.4
Total CHOL/HDL Ratio: 4
Triglycerides: 231 mg/dL — ABNORMAL HIGH (ref 0.0–149.0)
VLDL: 46.2 mg/dL — ABNORMAL HIGH (ref 0.0–40.0)

## 2014-01-26 LAB — LDL CHOLESTEROL, DIRECT: LDL DIRECT: 108.6 mg/dL

## 2014-01-26 NOTE — Progress Notes (Signed)
Subjective:    Sara Barnes is a 73 y.o. female who presents for Medicare Annual/Subsequent preventive examination.  Preventive Screening-Counseling & Management  Tobacco History  Smoking status  . Never Smoker   Smokeless tobacco  . Never Used     Problems Prior to Visit 1. none  Current Problems (verified) Patient Active Problem List   Diagnosis Date Noted  . Perirectal abscess 09/29/2013  . Ulcerative colitis, unspecified 09/27/2013  . Rectal pain 09/27/2013  . Thoracic back pain 03/01/2013  . Snoring 04/19/2012  . Pulmonary nodule 06/24/2011  . HYPERLIPIDEMIA 03/14/2010  . ARM PAIN, LEFT 03/14/2010  . OSTEOPENIA 01/12/2010  . VITAMIN D DEFICIENCY 12/13/2009  . POSTMENOPAUSAL STATUS 12/13/2009  . SINUSITIS, CHRONIC 12/11/2008  . CHEST PAIN UNSPECIFIED 10/06/2008  . DEPRESSION/ANXIETY 01/20/2008  . IRRITABLE BOWEL SYNDROME 01/20/2008  . BREAST MASS, RIGHT 03/08/2007  . VITAMIN B12 DEFICIENCY 08/12/2006  . HIATAL HERNIA 08/12/2006  . BREAST IMPLANTS, BILATERAL, HX OF 08/12/2006    Medications Prior to Visit Current Outpatient Prescriptions on File Prior to Visit  Medication Sig Dispense Refill  . atorvastatin (LIPITOR) 20 MG tablet Take 10 mg by mouth 2 (two) times a week. *no specific days that patient takes med, only that she takes twice a week*      . cyanocobalamin (,VITAMIN B-12,) 1000 MCG/ML injection INJECT 1 ML (1,000 MCG TOTAL) INTO THE MUSCLE EVERY 30 (THIRTY) DAYS.  3 mL  2  . FLUoxetine (PROZAC) 20 MG capsule TAKE ONE CAPSULE BY MOUTH ONCE DAILY  90 capsule  1  . ibuprofen (ADVIL,MOTRIN) 200 MG tablet Take 400 mg by mouth every 6 (six) hours as needed for mild pain (pain).        No current facility-administered medications on file prior to visit.    Current Medications (verified) Current Outpatient Prescriptions  Medication Sig Dispense Refill  . atorvastatin (LIPITOR) 20 MG tablet Take 10 mg by mouth 2 (two) times a week. *no specific days  that patient takes med, only that she takes twice a week*      . cyanocobalamin (,VITAMIN B-12,) 1000 MCG/ML injection INJECT 1 ML (1,000 MCG TOTAL) INTO THE MUSCLE EVERY 30 (THIRTY) DAYS.  3 mL  2  . FLUoxetine (PROZAC) 20 MG capsule TAKE ONE CAPSULE BY MOUTH ONCE DAILY  90 capsule  1  . ibuprofen (ADVIL,MOTRIN) 200 MG tablet Take 400 mg by mouth every 6 (six) hours as needed for mild pain (pain).        No current facility-administered medications for this visit.     Allergies (verified) Sulfonamide derivatives; Codeine; and Mesalamine   PAST HISTORY  Family History Family History  Problem Relation Age of Onset  . Hypertension    . Prostate cancer Brother 43  . Kidney disease Brother   . Diabetes Brother   . Other Father     Brain Tumor  . Stomach cancer Paternal Grandfather   . Rheum arthritis Mother   . Rheum arthritis Brother   . Emphysema Maternal Grandfather   . Asthma Sister     Social History History  Substance Use Topics  . Smoking status: Never Smoker   . Smokeless tobacco: Never Used  . Alcohol Use: Yes     Are there smokers in your home (other than you)? No  Risk Factors Current exercise habits: walking   Dietary issues discussed: na   Cardiac risk factors: dyslipidemia.  Depression Screen (Note: if answer to either of the following is "Yes", a more  complete depression screening is indicated)   Over the past two weeks, have you felt down, depressed or hopeless? No  Over the past two weeks, have you felt little interest or pleasure in doing things? No  Have you lost interest or pleasure in daily life? No  Do you often feel hopeless? No  Do you cry easily over simple problems? No  Activities of Daily Living In your present state of health, do you have any difficulty performing the following activities?:  Driving? No Managing money?  No Feeding yourself? No Getting from bed to chair? No Climbing a flight of stairs? No Preparing food and eating?:  No Bathing or showering? No Getting dressed: No Getting to the toilet? No Using the toilet:No Moving around from place to place: No In the past year have you fallen or had a near fall?:No   Are you sexually active?  No  Do you have more than one partner?  No  Hearing Difficulties: Yes Do you often ask people to speak up or repeat themselves? Yes Do you experience ringing or noises in your ears? No Do you have difficulty understanding soft or whispered voices? Yes   Do you feel that you have a problem with memory? No  Do you often misplace items? No  Do you feel safe at home?  Yes  Cognitive Testing  Alert? Yes  Normal Appearance?Yes  Oriented to person? Yes  Place? Yes   Time? Yes  Recall of three objects?  Yes  Can perform simple calculations? Yes  Displays appropriate judgment?Yes  Can read the correct time from a watch face?Yes   Advanced Directives have been discussed with the patient? Yes  List the Names of Other Physician/Practitioners you currently use: 1.  Dentist--[facility] 2.  Surgery-- [facility]   Indicate any recent Medical Services you may have received from other than [facility] providers in the past year (date may be approximate).  Immunization History  Administered Date(s) Administered  . Influenza,inj,Quad PF,36+ Mos 01/26/2014  . Pneumococcal Polysaccharide-23 12/10/2007  . Td 06/04/2004, 08/12/2006  . Zoster 03/01/2012    Screening Tests Health Maintenance  Topic Date Due  . Mammogram  03/22/2014  . Colonoscopy  05/27/2014  . Influenza Vaccine  11/27/2014  . Tetanus/tdap  08/11/2016  . Pneumococcal Polysaccharide Vaccine Age 39 And Over  Completed  . Zostavax  Completed    All answers were reviewed with the patient and necessary referrals were made:  [name], DO   01/26/2014   History reviewed:  She  has a past medical history of Depression; Hiatal hernia; GERD (gastroesophageal reflux disease); Vitamin B12 deficiency; Ulcerative colitis;  Benign neoplasm; Pulmonary nodule, right (03/2011); and Colitis. She  does not have any pertinent problems on file. She  has past surgical history that includes Abdominal hysterectomy; Appendectomy; Breast surgery; Breast enhancement surgery; and Incision and drainage perirectal abscess (N/A, 09/28/2013). Her family history includes Asthma in her sister; Diabetes in her brother; Emphysema in her maternal grandfather; Hypertension in an other family member; Kidney disease in her brother; Other in her father; Prostate cancer (age of onset: 67) in her brother; Rheum arthritis in her brother and mother; Stomach cancer in her paternal grandfather. She  reports that she has never smoked. She has never used smokeless tobacco. She reports that she drinks alcohol. She reports that she does not use illicit drugs. She has a current medication list which includes the following prescription(s): atorvastatin, cyanocobalamin, fluoxetine, and ibuprofen. Current Outpatient Prescriptions on File Prior to  Visit  Medication Sig Dispense Refill  . atorvastatin (LIPITOR) 20 MG tablet Take 10 mg by mouth 2 (two) times a week. *no specific days that patient takes med, only that she takes twice a week*      . cyanocobalamin (,VITAMIN B-12,) 1000 MCG/ML injection INJECT 1 ML (1,000 MCG TOTAL) INTO THE MUSCLE EVERY 30 (THIRTY) DAYS.  3 mL  2  . FLUoxetine (PROZAC) 20 MG capsule TAKE ONE CAPSULE BY MOUTH ONCE DAILY  90 capsule  1  . ibuprofen (ADVIL,MOTRIN) 200 MG tablet Take 400 mg by mouth every 6 (six) hours as needed for mild pain (pain).        No current facility-administered medications on file prior to visit.   She is allergic to sulfonamide derivatives; codeine; and mesalamine.  Review of Systems  Review of Systems  Constitutional: Negative for activity change, appetite change and fatigue.  HENT: Negative for hearing loss, congestion, tinnitus and ear discharge.   Eyes: Negative for visual disturbance (see optho  q1y -- vision corrected to 20/20 with glasses).  Respiratory: Negative for cough, chest tightness and shortness of breath.   Cardiovascular: Negative for chest pain, palpitations and leg swelling.  Gastrointestinal: Negative for abdominal pain, diarrhea, constipation and abdominal distention.  Genitourinary: Negative for urgency, frequency, decreased urine volume and difficulty urinating.  Musculoskeletal: Negative for back pain, arthralgias and gait problem.  Skin: Negative for color change, pallor and rash.  Neurological: Negative for dizziness, light-headedness, numbness and headaches.  Hematological: Negative for adenopathy. Does not bruise/bleed easily.  Psychiatric/Behavioral: Negative for suicidal ideas, confusion, sleep disturbance, self-injury, dysphoric mood, decreased concentration and agitation.  Pt is able to read and write and can do all ADLs No risk for falling No abuse/ violence in home     Objective:     Vision by Snellen chart: opth  Body mass index is 25.13 kg/(m^2). BP 134/82  Pulse 71  Temp(Src) 98.2 F (36.8 C) (Oral)  Ht 5' 6"  (1.676 m)  Wt 155 lb 10.3 oz (70.6 kg)  BMI 25.13 kg/m2  SpO2 98%  BP 134/82  Pulse 71  Temp(Src) 98.2 F (36.8 C) (Oral)  Ht 5' 6"  (1.676 m)  Wt 155 lb 10.3 oz (70.6 kg)  BMI 25.13 kg/m2  SpO2 98% General appearance: alert, cooperative, appears stated age and no distress Head: Normocephalic, without obvious abnormality, atraumatic Eyes: negative findings: lids and lashes normal and pupils equal, round, reactive to light and accomodation Ears: normal TM's and external ear canals both ears Nose: Nares normal. Septum midline. Mucosa normal. No drainage or sinus tenderness. Throat: lips, mucosa, and tongue normal; teeth and gums normal Neck: no adenopathy, no carotid bruit, no JVD, supple, symmetrical, trachea midline and thyroid not enlarged, symmetric, no tenderness/mass/nodules Back: symmetric, no curvature. ROM normal. No CVA  tenderness. Lungs: clear to auscultation bilaterally Breasts: normal appearance, no masses or tenderness Heart: regular rate and rhythm, S1, S2 normal, no murmur, click, rub or gallop Abdomen: soft, non-tender; bowel sounds normal; no masses,  no organomegaly Pelvic: deferred Extremities: extremities normal, atraumatic, no cyanosis or edema Pulses: 2+ and symmetric Skin: Skin color, texture, turgor normal. No rashes or lesions Lymph nodes: Cervical, supraclavicular, and axillary nodes normal. Neurologic: Alert and oriented X 3, normal strength and tone. Normal symmetric reflexes. Normal coordination and gait Psych-- no depression, no anxiety      Assessment:     cpe     Plan:     During the course of the visit the  patient was educated and counseled about appropriate screening and preventive services including:    Pneumococcal vaccine   Influenza vaccine  Td vaccine  Screening mammography  Bone densitometry screening  Colorectal cancer screening  Diabetes screening  Advanced directives: has an advanced directive - a copy HAS NOT been provided.  Diet review for nutrition referral? Yes ____  Not Indicated _x___   Patient Instructions (the written plan) was given to the patient.  Medicare Attestation I have personally reviewed: The patient's medical and social history Their use of alcohol, tobacco or illicit drugs Their current medications and supplements The patient's functional ability including ADLs,fall risks, home safety risks, cognitive, and hearing and visual impairment Diet and physical activities Evidence for depression or mood disorders  The patient's weight, height, BMI, and visual acuity have been recorded in the chart.  I have made referrals, counseling, and provided education to the patient based on review of the above and I have provided the patient with a written personalized care plan for preventive services.    1. Need for immunization against  influenza  - Flu Vaccine QUAD 36+ mos PF IM (Fluarix Quad PF)  2. Hyperlipidemia Check labs, con't meds - Basic metabolic panel - CBC with Differential - Hepatic function panel - Lipid panel - POCT urinalysis dipstick  3. Medicare annual wellness visit, subsequent    [name], DO   01/26/2014

## 2014-01-26 NOTE — Progress Notes (Signed)
Pre visit review using our clinic review tool, if applicable. No additional management support is needed unless otherwise documented below in the visit note. 

## 2014-01-26 NOTE — Addendum Note (Signed)
Addended by: Ewing Schlein on: 01/26/2014 01:44 PM   Modules accepted: Orders

## 2014-01-26 NOTE — Patient Instructions (Signed)
Preventive Care for Adults A healthy lifestyle and preventive care can promote health and wellness. Preventive health guidelines for women include the following key practices.  A routine yearly physical is a good way to check with your health care provider about your health and preventive screening. It is a chance to share any concerns and updates on your health and to receive a thorough exam.  Visit your dentist for a routine exam and preventive care every 6 months. Brush your teeth twice a day and floss once a day. Good oral hygiene prevents tooth decay and gum disease.  The frequency of eye exams is based on your age, health, family medical history, use of contact lenses, and other factors. Follow your health care provider's recommendations for frequency of eye exams.  Eat a healthy diet. Foods like vegetables, fruits, whole grains, low-fat dairy products, and lean protein foods contain the nutrients you need without too many calories. Decrease your intake of foods high in solid fats, added sugars, and salt. Eat the right amount of calories for you.Get information about a proper diet from your health care provider, if necessary.  Regular physical exercise is one of the most important things you can do for your health. Most adults should get at least 150 minutes of moderate-intensity exercise (any activity that increases your heart rate and causes you to sweat) each week. In addition, most adults need muscle-strengthening exercises on 2 or more days a week.  Maintain a healthy weight. The body mass index (BMI) is a screening tool to identify possible weight problems. It provides an estimate of body fat based on height and weight. Your health care provider can find your BMI and can help you achieve or maintain a healthy weight.For adults 20 years and older:  A BMI below 18.5 is considered underweight.  A BMI of 18.5 to 24.9 is normal.  A BMI of 25 to 29.9 is considered overweight.  A BMI of  30 and above is considered obese.  Maintain normal blood lipids and cholesterol levels by exercising and minimizing your intake of saturated fat. Eat a balanced diet with plenty of fruit and vegetables. Blood tests for lipids and cholesterol should begin at age 76 and be repeated every 5 years. If your lipid or cholesterol levels are high, you are over 50, or you are at high risk for heart disease, you may need your cholesterol levels checked more frequently.Ongoing high lipid and cholesterol levels should be treated with medicines if diet and exercise are not working.  If you smoke, find out from your health care provider how to quit. If you do not use tobacco, do not start.  Lung cancer screening is recommended for adults aged 22-80 years who are at high risk for developing lung cancer because of a history of smoking. A yearly low-dose CT scan of the lungs is recommended for people who have at least a 30-pack-year history of smoking and are a current smoker or have quit within the past 15 years. A pack year of smoking is smoking an average of 1 pack of cigarettes a day for 1 year (for example: 1 pack a day for 30 years or 2 packs a day for 15 years). Yearly screening should continue until the smoker has stopped smoking for at least 15 years. Yearly screening should be stopped for people who develop a health problem that would prevent them from having lung cancer treatment.  If you are pregnant, do not drink alcohol. If you are breastfeeding,  be very cautious about drinking alcohol. If you are not pregnant and choose to drink alcohol, do not have more than 1 drink per day. One drink is considered to be 12 ounces (355 mL) of beer, 5 ounces (148 mL) of wine, or 1.5 ounces (44 mL) of liquor.  Avoid use of street drugs. Do not share needles with anyone. Ask for help if you need support or instructions about stopping the use of drugs.  High blood pressure causes heart disease and increases the risk of  stroke. Your blood pressure should be checked at least every 1 to 2 years. Ongoing high blood pressure should be treated with medicines if weight loss and exercise do not work.  If you are 75-52 years old, ask your health care provider if you should take aspirin to prevent strokes.  Diabetes screening involves taking a blood sample to check your fasting blood sugar level. This should be done once every 3 years, after age 15, if you are within normal weight and without risk factors for diabetes. Testing should be considered at a younger age or be carried out more frequently if you are overweight and have at least 1 risk factor for diabetes.  Breast cancer screening is essential preventive care for women. You should practice "breast self-awareness." This means understanding the normal appearance and feel of your breasts and may include breast self-examination. Any changes detected, no matter how small, should be reported to a health care provider. Women in their 58s and 30s should have a clinical breast exam (CBE) by a health care provider as part of a regular health exam every 1 to 3 years. After age 16, women should have a CBE every year. Starting at age 53, women should consider having a mammogram (breast X-ray test) every year. Women who have a family history of breast cancer should talk to their health care provider about genetic screening. Women at a high risk of breast cancer should talk to their health care providers about having an MRI and a mammogram every year.  Breast cancer gene (BRCA)-related cancer risk assessment is recommended for women who have family members with BRCA-related cancers. BRCA-related cancers include breast, ovarian, tubal, and peritoneal cancers. Having family members with these cancers may be associated with an increased risk for harmful changes (mutations) in the breast cancer genes BRCA1 and BRCA2. Results of the assessment will determine the need for genetic counseling and  BRCA1 and BRCA2 testing.  Routine pelvic exams to screen for cancer are no longer recommended for nonpregnant women who are considered low risk for cancer of the pelvic organs (ovaries, uterus, and vagina) and who do not have symptoms. Ask your health care provider if a screening pelvic exam is right for you.  If you have had past treatment for cervical cancer or a condition that could lead to cancer, you need Pap tests and screening for cancer for at least 20 years after your treatment. If Pap tests have been discontinued, your risk factors (such as having a new sexual partner) need to be reassessed to determine if screening should be resumed. Some women have medical problems that increase the chance of getting cervical cancer. In these cases, your health care provider may recommend more frequent screening and Pap tests.  The HPV test is an additional test that may be used for cervical cancer screening. The HPV test looks for the virus that can cause the cell changes on the cervix. The cells collected during the Pap test can be  tested for HPV. The HPV test could be used to screen women aged 30 years and older, and should be used in women of any age who have unclear Pap test results. After the age of 30, women should have HPV testing at the same frequency as a Pap test.  Colorectal cancer can be detected and often prevented. Most routine colorectal cancer screening begins at the age of 50 years and continues through age 75 years. However, your health care provider may recommend screening at an earlier age if you have risk factors for colon cancer. On a yearly basis, your health care provider may provide home test kits to check for hidden blood in the stool. Use of a small camera at the end of a tube, to directly examine the colon (sigmoidoscopy or colonoscopy), can detect the earliest forms of colorectal cancer. Talk to your health care provider about this at age 50, when routine screening begins. Direct  exam of the colon should be repeated every 5-10 years through age 75 years, unless early forms of pre-cancerous polyps or small growths are found.  People who are at an increased risk for hepatitis B should be screened for this virus. You are considered at high risk for hepatitis B if:  You were born in a country where hepatitis B occurs often. Talk with your health care provider about which countries are considered high risk.  Your parents were born in a high-risk country and you have not received a shot to protect against hepatitis B (hepatitis B vaccine).  You have HIV or AIDS.  You use needles to inject street drugs.  You live with, or have sex with, someone who has hepatitis B.  You get hemodialysis treatment.  You take certain medicines for conditions like cancer, organ transplantation, and autoimmune conditions.  Hepatitis C blood testing is recommended for all people born from 1945 through 1965 and any individual with known risks for hepatitis C.  Practice safe sex. Use condoms and avoid high-risk sexual practices to reduce the spread of sexually transmitted infections (STIs). STIs include gonorrhea, chlamydia, syphilis, trichomonas, herpes, HPV, and human immunodeficiency virus (HIV). Herpes, HIV, and HPV are viral illnesses that have no cure. They can result in disability, cancer, and death.  You should be screened for sexually transmitted illnesses (STIs) including gonorrhea and chlamydia if:  You are sexually active and are younger than 24 years.  You are older than 24 years and your health care provider tells you that you are at risk for this type of infection.  Your sexual activity has changed since you were last screened and you are at an increased risk for chlamydia or gonorrhea. Ask your health care provider if you are at risk.  If you are at risk of being infected with HIV, it is recommended that you take a prescription medicine daily to prevent HIV infection. This is  called preexposure prophylaxis (PrEP). You are considered at risk if:  You are a heterosexual woman, are sexually active, and are at increased risk for HIV infection.  You take drugs by injection.  You are sexually active with a partner who has HIV.  Talk with your health care provider about whether you are at high risk of being infected with HIV. If you choose to begin PrEP, you should first be tested for HIV. You should then be tested every 3 months for as long as you are taking PrEP.  Osteoporosis is a disease in which the bones lose minerals and strength   with aging. This can result in serious bone fractures or breaks. The risk of osteoporosis can be identified using a bone density scan. Women ages 65 years and over and women at risk for fractures or osteoporosis should discuss screening with their health care providers. Ask your health care provider whether you should take a calcium supplement or vitamin D to reduce the rate of osteoporosis.  Menopause can be associated with physical symptoms and risks. Hormone replacement therapy is available to decrease symptoms and risks. You should talk to your health care provider about whether hormone replacement therapy is right for you.  Use sunscreen. Apply sunscreen liberally and repeatedly throughout the day. You should seek shade when your shadow is shorter than you. Protect yourself by wearing long sleeves, pants, a wide-brimmed hat, and sunglasses year round, whenever you are outdoors.  Once a month, do a whole body skin exam, using a mirror to look at the skin on your back. Tell your health care provider of new moles, moles that have irregular borders, moles that are larger than a pencil eraser, or moles that have changed in shape or color.  Stay current with required vaccines (immunizations).  Influenza vaccine. All adults should be immunized every year.  Tetanus, diphtheria, and acellular pertussis (Td, Tdap) vaccine. Pregnant women should  receive 1 dose of Tdap vaccine during each pregnancy. The dose should be obtained regardless of the length of time since the last dose. Immunization is preferred during the 27th-36th week of gestation. An adult who has not previously received Tdap or who does not know her vaccine status should receive 1 dose of Tdap. This initial dose should be followed by tetanus and diphtheria toxoids (Td) booster doses every 10 years. Adults with an unknown or incomplete history of completing a 3-dose immunization series with Td-containing vaccines should begin or complete a primary immunization series including a Tdap dose. Adults should receive a Td booster every 10 years.  Varicella vaccine. An adult without evidence of immunity to varicella should receive 2 doses or a second dose if she has previously received 1 dose. Pregnant females who do not have evidence of immunity should receive the first dose after pregnancy. This first dose should be obtained before leaving the health care facility. The second dose should be obtained 4-8 weeks after the first dose.  Human papillomavirus (HPV) vaccine. Females aged 13-26 years who have not received the vaccine previously should obtain the 3-dose series. The vaccine is not recommended for use in pregnant females. However, pregnancy testing is not needed before receiving a dose. If a female is found to be pregnant after receiving a dose, no treatment is needed. In that case, the remaining doses should be delayed until after the pregnancy. Immunization is recommended for any person with an immunocompromised condition through the age of 26 years if she did not get any or all doses earlier. During the 3-dose series, the second dose should be obtained 4-8 weeks after the first dose. The third dose should be obtained 24 weeks after the first dose and 16 weeks after the second dose.  Zoster vaccine. One dose is recommended for adults aged 60 years or older unless certain conditions are  present.  Measles, mumps, and rubella (MMR) vaccine. Adults born before 1957 generally are considered immune to measles and mumps. Adults born in 1957 or later should have 1 or more doses of MMR vaccine unless there is a contraindication to the vaccine or there is laboratory evidence of immunity to   each of the three diseases. A routine second dose of MMR vaccine should be obtained at least 28 days after the first dose for students attending postsecondary schools, health care workers, or international travelers. People who received inactivated measles vaccine or an unknown type of measles vaccine during 1963-1967 should receive 2 doses of MMR vaccine. People who received inactivated mumps vaccine or an unknown type of mumps vaccine before 1979 and are at high risk for mumps infection should consider immunization with 2 doses of MMR vaccine. For females of childbearing age, rubella immunity should be determined. If there is no evidence of immunity, females who are not pregnant should be vaccinated. If there is no evidence of immunity, females who are pregnant should delay immunization until after pregnancy. Unvaccinated health care workers born before 1957 who lack laboratory evidence of measles, mumps, or rubella immunity or laboratory confirmation of disease should consider measles and mumps immunization with 2 doses of MMR vaccine or rubella immunization with 1 dose of MMR vaccine.  Pneumococcal 13-valent conjugate (PCV13) vaccine. When indicated, a person who is uncertain of her immunization history and has no record of immunization should receive the PCV13 vaccine. An adult aged 19 years or older who has certain medical conditions and has not been previously immunized should receive 1 dose of PCV13 vaccine. This PCV13 should be followed with a dose of pneumococcal polysaccharide (PPSV23) vaccine. The PPSV23 vaccine dose should be obtained at least 8 weeks after the dose of PCV13 vaccine. An adult aged 19  years or older who has certain medical conditions and previously received 1 or more doses of PPSV23 vaccine should receive 1 dose of PCV13. The PCV13 vaccine dose should be obtained 1 or more years after the last PPSV23 vaccine dose.  Pneumococcal polysaccharide (PPSV23) vaccine. When PCV13 is also indicated, PCV13 should be obtained first. All adults aged 65 years and older should be immunized. An adult younger than age 65 years who has certain medical conditions should be immunized. Any person who resides in a nursing home or long-term care facility should be immunized. An adult smoker should be immunized. People with an immunocompromised condition and certain other conditions should receive both PCV13 and PPSV23 vaccines. People with human immunodeficiency virus (HIV) infection should be immunized as soon as possible after diagnosis. Immunization during chemotherapy or radiation therapy should be avoided. Routine use of PPSV23 vaccine is not recommended for American Indians, [place] Natives, or people younger than 65 years unless there are medical conditions that require PPSV23 vaccine. When indicated, people who have unknown immunization and have no record of immunization should receive PPSV23 vaccine. One-time revaccination 5 years after the first dose of PPSV23 is recommended for people aged 19-64 years who have chronic kidney failure, nephrotic syndrome, asplenia, or immunocompromised conditions. People who received 1-2 doses of PPSV23 before age 65 years should receive another dose of PPSV23 vaccine at age 65 years or later if at least 5 years have passed since the previous dose. Doses of PPSV23 are not needed for people immunized with PPSV23 at or after age 65 years.  Meningococcal vaccine. Adults with asplenia or persistent complement component deficiencies should receive 2 doses of quadrivalent meningococcal conjugate (MenACWY-D) vaccine. The doses should be obtained at least 2 months apart.  Microbiologists working with certain meningococcal bacteria, military recruits, people at risk during an outbreak, and people who travel to or live in countries with a high rate of meningitis should be immunized. A first-year college student up through age   21 years who is living in a residence hall should receive a dose if she did not receive a dose on or after her 16th birthday. Adults who have certain high-risk conditions should receive one or more doses of vaccine.  Hepatitis A vaccine. Adults who wish to be protected from this disease, have certain high-risk conditions, work with hepatitis A-infected animals, work in hepatitis A research labs, or travel to or work in countries with a high rate of hepatitis A should be immunized. Adults who were previously unvaccinated and who anticipate close contact with an international adoptee during the first 60 days after arrival in the [place] from a country with a high rate of hepatitis A should be immunized.  Hepatitis B vaccine. Adults who wish to be protected from this disease, have certain high-risk conditions, may be exposed to blood or other infectious body fluids, are household contacts or sex partners of hepatitis B positive people, are clients or workers in certain care facilities, or travel to or work in countries with a high rate of hepatitis B should be immunized.  Haemophilus influenzae type b (Hib) vaccine. A previously unvaccinated person with asplenia or sickle cell disease or having a scheduled splenectomy should receive 1 dose of Hib vaccine. Regardless of previous immunization, a recipient of a hematopoietic stem cell transplant should receive a 3-dose series 6-12 months after her successful transplant. Hib vaccine is not recommended for adults with HIV infection. Preventive Services / Frequency Ages 64 to 68 years  Blood pressure check.** / Every 1 to 2 years.  Lipid and cholesterol check.** / Every 5 years beginning at age  22.  Clinical breast exam.** / Every 3 years for women in their 88s and 53s.  BRCA-related cancer risk assessment.** / For women who have family members with a BRCA-related cancer (breast, ovarian, tubal, or peritoneal cancers).  Pap test.** / Every 2 years from ages 90 through 51. Every 3 years starting at age 21 through age 56 or 3 with a history of 3 consecutive normal Pap tests.  HPV screening.** / Every 3 years from ages 24 through ages 1 to 46 with a history of 3 consecutive normal Pap tests.  Hepatitis C blood test.** / For any individual with known risks for hepatitis C.  Skin self-exam. / Monthly.  Influenza vaccine. / Every year.  Tetanus, diphtheria, and acellular pertussis (Tdap, Td) vaccine.** / Consult your health care provider. Pregnant women should receive 1 dose of Tdap vaccine during each pregnancy. 1 dose of Td every 10 years.  Varicella vaccine.** / Consult your health care provider. Pregnant females who do not have evidence of immunity should receive the first dose after pregnancy.  HPV vaccine. / 3 doses over 6 months, if 72 and younger. The vaccine is not recommended for use in pregnant females. However, pregnancy testing is not needed before receiving a dose.  Measles, mumps, rubella (MMR) vaccine.** / You need at least 1 dose of MMR if you were born in 1957 or later. You may also need a 2nd dose. For females of childbearing age, rubella immunity should be determined. If there is no evidence of immunity, females who are not pregnant should be vaccinated. If there is no evidence of immunity, females who are pregnant should delay immunization until after pregnancy.  Pneumococcal 13-valent conjugate (PCV13) vaccine.** / Consult your health care provider.  Pneumococcal polysaccharide (PPSV23) vaccine.** / 1 to 2 doses if you smoke cigarettes or if you have certain conditions.  Meningococcal vaccine.** /  1 dose if you are age 19 to 21 years and a first-year college  student living in a residence hall, or have one of several medical conditions, you need to get vaccinated against meningococcal disease. You may also need additional booster doses.  Hepatitis A vaccine.** / Consult your health care provider.  Hepatitis B vaccine.** / Consult your health care provider.  Haemophilus influenzae type b (Hib) vaccine.** / Consult your health care provider. Ages 40 to 64 years  Blood pressure check.** / Every 1 to 2 years.  Lipid and cholesterol check.** / Every 5 years beginning at age 20 years.  Lung cancer screening. / Every year if you are aged 55-80 years and have a 30-pack-year history of smoking and currently smoke or have quit within the past 15 years. Yearly screening is stopped once you have quit smoking for at least 15 years or develop a health problem that would prevent you from having lung cancer treatment.  Clinical breast exam.** / Every year after age 40 years.  BRCA-related cancer risk assessment.** / For women who have family members with a BRCA-related cancer (breast, ovarian, tubal, or peritoneal cancers).  Mammogram.** / Every year beginning at age 40 years and continuing for as long as you are in good health. Consult with your health care provider.  Pap test.** / Every 3 years starting at age 30 years through age 65 or 70 years with a history of 3 consecutive normal Pap tests.  HPV screening.** / Every 3 years from ages 30 years through ages 65 to 70 years with a history of 3 consecutive normal Pap tests.  Fecal occult blood test (FOBT) of stool. / Every year beginning at age 50 years and continuing until age 75 years. You may not need to do this test if you get a colonoscopy every 10 years.  Flexible sigmoidoscopy or colonoscopy.** / Every 5 years for a flexible sigmoidoscopy or every 10 years for a colonoscopy beginning at age 50 years and continuing until age 75 years.  Hepatitis C blood test.** / For all people born from 1945 through  1965 and any individual with known risks for hepatitis C.  Skin self-exam. / Monthly.  Influenza vaccine. / Every year.  Tetanus, diphtheria, and acellular pertussis (Tdap/Td) vaccine.** / Consult your health care provider. Pregnant women should receive 1 dose of Tdap vaccine during each pregnancy. 1 dose of Td every 10 years.  Varicella vaccine.** / Consult your health care provider. Pregnant females who do not have evidence of immunity should receive the first dose after pregnancy.  Zoster vaccine.** / 1 dose for adults aged 60 years or older.  Measles, mumps, rubella (MMR) vaccine.** / You need at least 1 dose of MMR if you were born in 1957 or later. You may also need a 2nd dose. For females of childbearing age, rubella immunity should be determined. If there is no evidence of immunity, females who are not pregnant should be vaccinated. If there is no evidence of immunity, females who are pregnant should delay immunization until after pregnancy.  Pneumococcal 13-valent conjugate (PCV13) vaccine.** / Consult your health care provider.  Pneumococcal polysaccharide (PPSV23) vaccine.** / 1 to 2 doses if you smoke cigarettes or if you have certain conditions.  Meningococcal vaccine.** / Consult your health care provider.  Hepatitis A vaccine.** / Consult your health care provider.  Hepatitis B vaccine.** / Consult your health care provider.  Haemophilus influenzae type b (Hib) vaccine.** / Consult your health care provider. Ages 65   years and over  Blood pressure check.** / Every 1 to 2 years.  Lipid and cholesterol check.** / Every 5 years beginning at age 22 years.  Lung cancer screening. / Every year if you are aged 73-80 years and have a 30-pack-year history of smoking and currently smoke or have quit within the past 15 years. Yearly screening is stopped once you have quit smoking for at least 15 years or develop a health problem that would prevent you from having lung cancer  treatment.  Clinical breast exam.** / Every year after age 4 years.  BRCA-related cancer risk assessment.** / For women who have family members with a BRCA-related cancer (breast, ovarian, tubal, or peritoneal cancers).  Mammogram.** / Every year beginning at age 40 years and continuing for as long as you are in good health. Consult with your health care provider.  Pap test.** / Every 3 years starting at age 9 years through age 34 or 91 years with 3 consecutive normal Pap tests. Testing can be stopped between 65 and 70 years with 3 consecutive normal Pap tests and no abnormal Pap or HPV tests in the past 10 years.  HPV screening.** / Every 3 years from ages 57 years through ages 64 or 45 years with a history of 3 consecutive normal Pap tests. Testing can be stopped between 65 and 70 years with 3 consecutive normal Pap tests and no abnormal Pap or HPV tests in the past 10 years.  Fecal occult blood test (FOBT) of stool. / Every year beginning at age 15 years and continuing until age 17 years. You may not need to do this test if you get a colonoscopy every 10 years.  Flexible sigmoidoscopy or colonoscopy.** / Every 5 years for a flexible sigmoidoscopy or every 10 years for a colonoscopy beginning at age 86 years and continuing until age 71 years.  Hepatitis C blood test.** / For all people born from [birth year] through 1965 and any individual with known risks for hepatitis C.  Osteoporosis screening.** / A one-time screening for women ages 83 years and over and women at risk for fractures or osteoporosis.  Skin self-exam. / Monthly.  Influenza vaccine. / Every year.  Tetanus, diphtheria, and acellular pertussis (Tdap/Td) vaccine.** / 1 dose of Td every 10 years.  Varicella vaccine.** / Consult your health care provider.  Zoster vaccine.** / 1 dose for adults aged 61 years or older.  Pneumococcal 13-valent conjugate (PCV13) vaccine.** / Consult your health care provider.  Pneumococcal  polysaccharide (PPSV23) vaccine.** / 1 dose for all adults aged 28 years and older.  Meningococcal vaccine.** / Consult your health care provider.  Hepatitis A vaccine.** / Consult your health care provider.  Hepatitis B vaccine.** / Consult your health care provider.  Haemophilus influenzae type b (Hib) vaccine.** / Consult your health care provider. ** Family history and personal history of risk and conditions may change your health care provider's recommendations. Document Released: 06/10/2001 Document Revised: 08/29/2013 Document Reviewed: 09/09/2010 [facility] Patient Information 2015 [place], [place]. This information is not intended to replace advice given to you by your health care provider. Make sure you discuss any questions you have with your health care provider.

## 2014-01-27 LAB — POCT URINALYSIS DIPSTICK
BILIRUBIN UA: NEGATIVE
Blood, UA: NEGATIVE
Glucose, UA: NEGATIVE
Ketones, UA: NEGATIVE
LEUKOCYTES UA: NEGATIVE
NITRITE UA: NEGATIVE
PH UA: 5.5
Protein, UA: NEGATIVE
Spec Grav, UA: 1.025
Urobilinogen, UA: 0.2

## 2014-01-30 ENCOUNTER — Other Ambulatory Visit: Payer: Self-pay

## 2014-01-30 MED ORDER — FENOFIBRATE 160 MG PO TABS: 160.0000 mg | ORAL_TABLET | Freq: Every day | ORAL | Status: DC

## 2014-02-02 ENCOUNTER — Telehealth: Payer: Self-pay | Admitting: Family Medicine

## 2014-02-02 NOTE — Telephone Encounter (Signed)
Patient Information:  Caller Name: Glendene Wyer  Phone: [phone]  Patient: Sara Barnes, Sara Barnes  Gender: Female  DOB: 09-14-1940  Age: 73 Years  PCP: Rosalita Chessman.  Office Follow Up:  Does the office need to follow up with this patient?: Yes  Instructions For The Office: Patient describes a rash at injection site. Received Flu and pneumonia shot in same arm on 01/26/14.   Unsure how long it has been there/unknown. Afebrile. No pain and no itching. Please review and contact patient.  RN Note:  Patient describes a rash at injection site. Received Flu and pneumonia shot in same arm on 01/26/14.   Unsure how long it has been there/unknown. Afebrile. No pain and no itching. Please review and contact patient.  Symptoms  Reason For Call & Symptoms: Patient in office 01/26/14 for Wellness. Received Pneumonia shot and flu vaccine.  She has developed a rash on her Left arm at the injection site.  Onset -caller unsure. She was showering and noticed today 02/02/14.  Area is pink . Afebrile . Rash is 3 inches long x 1 inche wide. no warmth, no itching, no pain.    Dizziness during night on Sunday, 01/29/14 with some improvment  Reviewed Health History In EMR: Yes  Reviewed Medications In EMR: Yes  Reviewed Allergies In EMR: Yes  Reviewed Surgeries / Procedures: Yes  Date of Onset of Symptoms: 02/02/2014  Guideline(s) Used:  Adult Immunization Schedule, by Vaccine and Age Group - [place]  Immunization Reactions  Disposition Per Guideline:   See Today or Tomorrow in Office  Reason For Disposition Reached:   Redness around the injection site > 1 inch (2.5 cm) ( > 2 inches for 4th DTaP and > 3 inches for 5th DTaP)  Advice Given:  Call Back If:  Redness becomes larger than 1 inch (over 2 inches with 4th DTaP or over 3 inches with 5th DTaP)  Pain, swelling or redness gets worse after 3 days (or lasts over 7 days)  Fever starts after 2 days (or lasts over 3 days)  Your child becomes worse  Call  Back If:  Pain, swelling or redness gets worse after 3 days (or lasts over 7 days)  Fever starts after 2 days (or lasts over 3 days)  Your child becomes worse  RN Overrode Recommendation:  Document Patient  Patient describes a rash at injection site. Received Flu and pneumonia shot in same arm on 01/26/14.   Unsure how long it has been there/unknown. Afebrile. No pain and no itching. Please review and contact patient.

## 2014-02-02 NOTE — Telephone Encounter (Signed)
MSG left to call the office      KP 

## 2014-02-02 NOTE — Telephone Encounter (Signed)
Please review CAN information and advise, patient would not give me anymore detail. She said if we are already paying an RN she was not going to give anymore information to anyone because she is not going to keep saying the same thing.        KP

## 2014-02-02 NOTE — Telephone Encounter (Signed)
Patient returned phone call. Best # [phone]

## 2014-02-02 NOTE — Telephone Encounter (Signed)
Not sure what to say on that. She does not want to come in? And I don't like treating on the phone. Just document you advised her to come in.

## 2014-02-02 NOTE — Telephone Encounter (Signed)
So description is of a localized reaction, recommend an antihistamine such as Zyrtec 10 mg daily, ice twice a day and come in for evaluation if pain develops or rash worsens

## 2014-02-03 NOTE — Telephone Encounter (Signed)
I made the patient aware of Dr.Blythe's recommendations of an antihistamine such as Zyrtec 10 mg daily, ice twice a day and come in for evaluation if pain develops or rash worsens, she voiced understanding and agreed to do so.     KP

## 2014-02-10 ENCOUNTER — Encounter: Payer: Self-pay | Admitting: Internal Medicine

## 2014-04-03 ENCOUNTER — Other Ambulatory Visit: Payer: Self-pay | Admitting: Family Medicine

## 2014-04-05 ENCOUNTER — Other Ambulatory Visit: Payer: Self-pay

## 2014-04-05 DIAGNOSIS — Z1231 Encounter for screening mammogram for malignant neoplasm of breast: Secondary | ICD-10-CM

## 2014-04-25 ENCOUNTER — Ambulatory Visit
Admission: RE | Admit: 2014-04-25 | Discharge: 2014-04-25 | Disposition: A | Discharge status: Home / Self Care | Payer: Commercial Managed Care - HMO | Source: Ambulatory Visit

## 2014-04-25 DIAGNOSIS — Z1231 Encounter for screening mammogram for malignant neoplasm of breast: Secondary | ICD-10-CM

## 2014-04-28 DIAGNOSIS — Z923 Personal history of irradiation: Secondary | ICD-10-CM

## 2014-04-28 HISTORY — PX: BREAST LUMPECTOMY: SHX2

## 2014-04-28 HISTORY — DX: Personal history of irradiation: Z92.3

## 2014-05-12 ENCOUNTER — Encounter: Payer: Self-pay | Admitting: Internal Medicine

## 2014-06-13 ENCOUNTER — Encounter: Payer: Self-pay | Admitting: Internal Medicine

## 2014-06-13 ENCOUNTER — Ambulatory Visit (INDEPENDENT_AMBULATORY_CARE_PROVIDER_SITE_OTHER): Payer: Commercial Managed Care - HMO | Admitting: Internal Medicine

## 2014-06-13 VITALS — BP 146/68 | HR 84 | Ht 65.75 in | Wt 155.4 lb

## 2014-06-13 DIAGNOSIS — K51919 Ulcerative colitis, unspecified with unspecified complications: Secondary | ICD-10-CM | POA: Diagnosis not present

## 2014-06-13 MED ORDER — FOLIC ACID 1 MG PO TABS: 1.0000 mg | ORAL_TABLET | Freq: Every day | ORAL | Status: DC

## 2014-06-13 MED ORDER — SULFASALAZINE 500 MG PO TABS: 500.0000 mg | ORAL_TABLET | Freq: Two times a day (BID) | ORAL | Status: DC

## 2014-06-13 MED ORDER — HYDROCORTISONE ACETATE 25 MG RE SUPP: 25.0000 mg | Freq: Two times a day (BID) | RECTAL | Status: DC

## 2014-06-13 MED ORDER — PREDNISONE 5 MG PO TABS: 5.0000 mg | ORAL_TABLET | Freq: Every day | ORAL | Status: DC

## 2014-06-13 MED ORDER — ZOLPIDEM TARTRATE 5 MG PO TABS: 5.0000 mg | ORAL_TABLET | Freq: Every evening | ORAL | Status: DC | PRN

## 2014-06-13 MED ORDER — NA SULFATE-K SULFATE-MG SULF 17.5-3.13-1.6 GM/177ML PO SOLN: ORAL | Status: DC

## 2014-06-13 NOTE — Progress Notes (Signed)
Sara Barnes 1941-04-03 [id number]  Note: This dictation was prepared with Dragon digital system. Any transcriptional errors that result from this procedure are unintentional.   History of Present Illness: This is a 74 year old white female with ulcerative colitis since 1982 initially followed by Dr.Sam Lebauers for many years , prior colonoscopies in 2006 2009 when she had pancolitis and most recently in January 2013 when she was found to have  a stricture at the splenic flexure and biopsies revealed quiescent colitis in the  ascending colon, splenic flexure and left colon including sigmoid colon. There was no dysplasia. Patient has not always followed instructions and has not kept her appointments. he  She is apparently allergic to mesalamine and sulfa but was able to tolerate Azulfidine in the past for many years. In my last note from March 2015 be wanted to put her on Colazal but for some reason she never took it. She developed rectal abscess one year ago which was surgically drained by Dr Lucia Gaskins.. Currently she has urgent bowel movements with some incontinence, rectal pain and small amount of bleeding. She cannot take certain medications because they're expensive.    Past Medical History  Diagnosis Date  . Depression   . Hiatal hernia   . GERD (gastroesophageal reflux disease)   . Vitamin B12 deficiency   . Ulcerative colitis   . Benign neoplasm     of skin of trunk  . Pulmonary nodule, right 03/2011    Rt middle lobe 3 mm  . Colitis     Past Surgical History  Procedure Laterality Date  . Abdominal hysterectomy    . Appendectomy    . Breast surgery      Lumpectomy-Left breast  . Breast enhancement surgery    . Incision and drainage perirectal abscess N/A 09/28/2013    Procedure: IRRIGATION AND DEBRIDEMENT PERIANAL ABSCESS, proctoscopy;  Surgeon: Shann Medal, MD;  Location: [facility];  Service: General;  Laterality: N/A;    Allergies  Allergen Reactions  . Sulfonamide  Derivatives Nausea And Vomiting, Swelling and Rash       . Codeine Nausea And Vomiting  . Mesalamine     questionable    Family history and social history have been reviewed.  Review of Systems: Positive for diarrhea. The low volume rectal bleeding. Left upper quadrant abdominal pain. Weight has been stable  The remainder of the 10 point ROS is negative except as outlined in the H&P  Physical Exam: General Appearance Well developed, in no distress Eyes  Non icteric  HEENT  Non traumatic, normocephalic  Mouth No lesion, tongue papillated, no cheilosis Neck Supple without adenopathy, thyroid not enlarged, no carotid bruits, no JVD Lungs Clear to auscultation bilaterally COR Normal S1, normal S2, regular rhythm, no murmur, quiet precordium Abdomen Soft but tender in left lower and left upper quadrant. Normoactive bowel sounds. Liver edge at costal margin.  Rectal Normal perianal area. Somewhat painfull digital exam. Normal rectal sphincter tone. No fistula. Stool is Hemoccult positive  Extremities  No pedal edema Skin No lesions Neurological Alert and oriented x 3 Psychological Normal mood and affect  Assessment and Plan:  74 year old white female with long-standing ulcerative colitis of at least 30 years duration not always compliant with medical regimen. Last appointment in one year ago. She is on no medication but her symptoms are suggestive of flareup. Last colonoscopy showed active pancolitis and stricture at the splenic flexure. She is due for repeat colonoscopy and  will be scheduled as soon  as there is an availability. I'm no sure at this point whether she is truly allergic to mesalamine and if she is will she take Azulfidine but she assures me that she used to take sulfasalazine with small dose of prednisone. We will start sulfasalazine 500 mg twice a day and prednisone 5 mg daily in combination with hydrocortisone suppositories. I will hold off on Canasa suppositories because  they're expensive. The colonoscopy  will be done also to rule out dysplasia I will make suggestions about long-term treatment. We will consider biologicals or immunomodulators in combination with Colazal.     Sara Barnes 06/13/2014

## 2014-06-13 NOTE — Patient Instructions (Addendum)
You have been scheduled for a colonoscopy. Please follow written instructions given to you at your visit today.  Please use the sample kit you have been given today. If you use inhalers (even only as needed), please bring them with you on the day of your procedure.  We have sent the medications to your pharmacy for you to pick up at your convenience.  I appreciate the opportunity to care for you.  Dr Etter Sjogren

## 2014-06-14 ENCOUNTER — Telehealth: Payer: Self-pay | Admitting: Family Medicine

## 2014-06-14 DIAGNOSIS — E785 Hyperlipidemia, unspecified: Secondary | ICD-10-CM

## 2014-06-14 NOTE — Telephone Encounter (Signed)
Left detailed message informing patient of this.

## 2014-06-14 NOTE — Telephone Encounter (Signed)
Yes, she needs labs. Orders are in.      KP

## 2014-06-14 NOTE — Telephone Encounter (Signed)
Caller name: Lisette Relation to pt: self Call back number: [phone] Pharmacy:  Reason for call:   Patient states that she received something through mychart stating that she needs labs.? Is this correct? If so, please order.

## 2014-06-15 ENCOUNTER — Telehealth: Payer: Self-pay | Admitting: *Deleted

## 2014-06-15 MED ORDER — HYDROCORTISONE ACETATE 25 MG RE SUPP: 25.0000 mg | Freq: Two times a day (BID) | RECTAL | Status: DC

## 2014-06-15 NOTE — Telephone Encounter (Signed)
Patient's insurance did not cover Anucort-HC 25 mg suppositories, take rectally, two times a day. Called the patient to let them know they could take Preparation H over-the-counter but the patient stated that does not work. She said that she wanted Anusol-HC 25 mg instead because that is cheaper. Patient stated she will pay out-of-pocket and we will not be doing a prior authorization for this. Sent Rx for Anusol to [facility] in [place], [place] on 06/15/14.

## 2014-06-28 ENCOUNTER — Encounter: Payer: Self-pay | Admitting: Internal Medicine

## 2014-06-28 ENCOUNTER — Encounter: Payer: Self-pay | Admitting: *Deleted

## 2014-06-28 ENCOUNTER — Telehealth: Payer: Self-pay | Admitting: Internal Medicine

## 2014-06-28 ENCOUNTER — Ambulatory Visit (AMBULATORY_SURGERY_CENTER): Payer: Commercial Managed Care - HMO | Admitting: Internal Medicine

## 2014-06-28 VITALS — BP 115/67 | HR 67 | Temp 98.4°F | Resp 18 | Ht 65.75 in | Wt 155.0 lb

## 2014-06-28 DIAGNOSIS — K51019 Ulcerative (chronic) pancolitis with unspecified complications: Secondary | ICD-10-CM

## 2014-06-28 DIAGNOSIS — K51919 Ulcerative colitis, unspecified with unspecified complications: Secondary | ICD-10-CM

## 2014-06-28 DIAGNOSIS — K219 Gastro-esophageal reflux disease without esophagitis: Secondary | ICD-10-CM | POA: Diagnosis not present

## 2014-06-28 DIAGNOSIS — K5190 Ulcerative colitis, unspecified, without complications: Secondary | ICD-10-CM | POA: Diagnosis not present

## 2014-06-28 DIAGNOSIS — K529 Noninfective gastroenteritis and colitis, unspecified: Secondary | ICD-10-CM | POA: Diagnosis not present

## 2014-06-28 MED ORDER — SODIUM CHLORIDE 0.9 % IV SOLN: 500.0000 mL | INTRAVENOUS | Status: DC

## 2014-06-28 NOTE — Telephone Encounter (Signed)
Sister called regarding patient with fever 101. Had colonoscopy with bx today. Has UC. Recently started on Azulfadine and prednisone. Has headache and mild cough. No other complaints (no pain,rash,SOB etc.). Told fever possibly due to UC or could have beginning of URI. ? Drug reaction. Recommended hydration, tylenol, hold azulfadine. Go to ER if condition worsens. Dr. Olevia Perches notified (and her nurse as well to follow up in am).

## 2014-06-28 NOTE — Progress Notes (Signed)
Called to room to assist during endoscopic procedure.  Patient ID and intended procedure confirmed with present staff. Received instructions for my participation in the procedure from the performing physician.  

## 2014-06-28 NOTE — Patient Instructions (Signed)
YOU HAD AN ENDOSCOPIC PROCEDURE TODAY AT [facility]:   Refer to the procedure report that was given to you for any specific questions about what was found during the examination.  If the procedure report does not answer your questions, please call your gastroenterologist to clarify.  If you requested that your care partner not be given the details of your procedure findings, then the procedure report has been included in a sealed envelope for you to review at your convenience later.  YOU SHOULD EXPECT: Some feelings of bloating in the abdomen. Passage of more gas than usual.  Walking can help get rid of the air that was put into your GI tract during the procedure and reduce the bloating. If you had a lower endoscopy (such as a colonoscopy or flexible sigmoidoscopy) you may notice spotting of blood in your stool or on the toilet paper. If you underwent a bowel prep for your procedure, you may not have a normal bowel movement for a few days.  Please Note:  You might notice some irritation and congestion in your nose or some drainage.  This is from the oxygen used during your procedure.  There is no need for concern and it should clear up in a day or so.  SYMPTOMS TO REPORT IMMEDIATELY:   Following lower endoscopy (colonoscopy or flexible sigmoidoscopy):  Excessive amounts of blood in the stool  Significant tenderness or worsening of abdominal pains  Swelling of the abdomen that is new, acute  Fever of 100F or higher   A gastroenterologist can be reached at any hour by calling [phone].   DIET: Your first meal following the procedure should be a small meal and then it is ok to progress to your normal diet. Heavy or fried foods are harder to digest and may make you feel nauseous or bloated.  Likewise, meals heavy in dairy and vegetables can increase bloating.  Drink plenty of fluids but you should avoid alcoholic beverages for 24 hours.  ACTIVITY:  You should plan to take it  easy for the rest of today and you should NOT DRIVE or use heavy machinery until tomorrow (because of the sedation medicines used during the test).    FOLLOW UP: Our staff will call the number listed on your records the next business day following your procedure to check on you and address any questions or concerns that you may have regarding the information given to you following your procedure. If we do not reach you, we will leave a message.  However, if you are feeling well and you are not experiencing any problems, there is no need to return our call.  We will assume that you have returned to your regular daily activities without incident.  If any biopsies were taken you will be contacted by phone or by letter within the next 1-3 weeks.  Please call us at [phone] if you have not heard about the biopsies in 3 weeks.    SIGNATURES/CONFIDENTIALITY: You and/or your care partner have signed paperwork which will be entered into your electronic medical record.  These signatures attest to the fact that that the information above on your After Visit Summary has been reviewed and is understood.  Full responsibility of the confidentiality of this discharge information lies with you and/or your care-partner.  Wait biopsies.

## 2014-06-28 NOTE — Progress Notes (Signed)
Patient awakening,vss,report to rn 

## 2014-06-28 NOTE — Telephone Encounter (Signed)
Pt had lot of cough and secretions during colonoscopy, was suctioned by  CRNA, Rollene Fare, please, call her, may need a CXR tomorrow. She was having URI prior to the procedure according to the sister.

## 2014-06-28 NOTE — Op Note (Signed)
[facility]  [address] [place], [id number]   COLONOSCOPY PROCEDURE REPORT  PATIENT: Sara Barnes, Sara Barnes  MR#: [id number] BIRTHDATE: [birth year] , 61  yrs. old GENDER: female ENDOSCOPIST: Lafayette Dragon, MD REFERRED WC:HENIDP Brundidge, DO PROCEDURE DATE:  06/28/2014 PROCEDURE:   Colonoscopy with biopsy First Screening Colonoscopy - Avg.  risk and is 50 yrs.  old or older - No.  Prior Negative Screening - Now for repeat screening. N/A  History of Adenoma - Now for follow-up colonoscopy & has been > or = to 3 yrs.  N/A  Polyps Removed Today? No. ASA CLASS:   Class III INDICATIONS:ulcerative colitis 04 30 years duration since 1982. Prior colonoscopies in 2006 2009 and in January 2013 showed pancolitis.  She had a rectal process in March 2015.  She had a stricture of the colon at splenic flexure in January 2013.Marland Kitchen Patient is not taking any medications, she has been having flareup of her symptoms. MEDICATIONS: Monitored anesthesia care and Propofol 350 mg IV  DESCRIPTION OF PROCEDURE:   After the risks benefits and alternatives of the procedure were thoroughly explained, informed consent was obtained.  The digital rectal exam revealed no abnormalities of the rectum.   The LB [id number]  endoscope was introduced through the anus and advanced to the cecum, which was identified by both the appendix and ileocecal valve. No adverse events experienced.   The quality of the prep was good, using MoviPrep  The instrument was then slowly withdrawn as the colon was fully examined.      COLON FINDINGS: A diffuse patch of abnormal mucosa was found throughout the entire examined colon. mucosa was friable patchy. With granularity and decreased vascular pattern. There was loss of haustration rule out the left transverse and right colon. There was no stricture. No polyps. Multiple biopsies were obtained in 5 separate specimen containers including cecum and ascending  colon. Transverse colon. Descending colon, sigmoid colon and rectum to rule out dysplasia More than 75% of the surface was affected. Retroflexed views revealed no abnormalities. The time to cecum=6 minutes 54 seconds.  Withdrawal time=8 minutes 03 seconds.  The scope was withdrawn and the procedure completed. COMPLICATIONS: There were no immediate complications.  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 Multiple biopsies in 5 separate containers were obtained to rule out dysplasia  RECOMMENDATIONS: 1.  Await pathology results 2.  We will await results of the biopsies and decide about further treatment.  Patient has allergy to mesalamine and sulfa.  I will consider biologicals.  I will also recommend total colectomy.as an option, but she has been reluctant to consider that. Because of high risk for colon cancer after having the diasease for 30 years, I will see her in the office to discuss different options.  eSigned:  Lafayette Dragon, MD 06/28/2014 8:19 AM   cc:   PATIENT NAME:  Smith, Mcnicholas MR#: [id number]

## 2014-06-29 ENCOUNTER — Telehealth: Payer: Self-pay

## 2014-06-29 MED ORDER — AZITHROMYCIN 250 MG PO TABS: ORAL_TABLET | ORAL | Status: DC

## 2014-06-29 NOTE — Addendum Note (Signed)
Addended by: Rosanne Sack R on: 06/29/2014 11:19 AM   Modules accepted: Orders

## 2014-06-29 NOTE — Telephone Encounter (Signed)
  Follow up Call-  Call back number 06/28/2014  Post procedure Call Back phone  # [phone]  Permission to leave phone message Yes     Patient questions:  Do you have a fever, pain , or abdominal swelling? No. Pain Score  0 *  Have you tolerated food without any problems? Yes.    Have you been able to return to your normal activities? Yes.    Do you have any questions about your discharge instructions: Diet   No. Medications  No. Follow up visit  No.  Do you have questions or concerns about your Care? No.  Actions: * If pain score is 4 or above: No action needed, pain <4. On call backs today, pt stated she felt "horrible" yesterday.  She states she felt "congested, had a fever, and brought up a lot of mucus."  She states that she is still bringing up mucus today but has no fever.  She states she did call the on call MD, who encouraged her to push fluids and go to the ER if she continually felt worse.  I encouraged her to do the same- push fluids and offered an office visit if she felt necessary.  She declined this but I told her to call back if she has recurrent fever or any further concerns.

## 2014-06-29 NOTE — Telephone Encounter (Signed)
Spoke with pt and she states she is feeling better than last night but still very congested in her chest. States she is coughing up yellow mucous. Please advise.

## 2014-06-29 NOTE — Telephone Encounter (Signed)
I would feel better if we called her in Z-pack, disp #1, follow instructions on the package.

## 2014-06-29 NOTE — Telephone Encounter (Signed)
Spoke with pt and she is aware, script sent to pharmacy.

## 2014-07-06 ENCOUNTER — Encounter: Payer: Self-pay | Admitting: Internal Medicine

## 2014-07-14 ENCOUNTER — Ambulatory Visit (INDEPENDENT_AMBULATORY_CARE_PROVIDER_SITE_OTHER): Payer: Commercial Managed Care - HMO | Admitting: Internal Medicine

## 2014-07-14 ENCOUNTER — Encounter: Payer: Self-pay | Admitting: Internal Medicine

## 2014-07-14 VITALS — BP 110/78 | HR 72 | Ht 65.75 in | Wt 156.0 lb

## 2014-07-14 DIAGNOSIS — K51919 Ulcerative colitis, unspecified with unspecified complications: Secondary | ICD-10-CM | POA: Diagnosis not present

## 2014-07-14 DIAGNOSIS — K51019 Ulcerative (chronic) pancolitis with unspecified complications: Secondary | ICD-10-CM

## 2014-07-14 MED ORDER — PREDNISONE 5 MG PO TABS: 5.0000 mg | ORAL_TABLET | Freq: Every day | ORAL | Status: DC

## 2014-07-14 MED ORDER — SULFASALAZINE 500 MG PO TABS: 500.0000 mg | ORAL_TABLET | Freq: Every day | ORAL | Status: DC

## 2014-07-14 NOTE — Progress Notes (Signed)
FAWNDA VITULLO 22-Jun-1940 [id number]  Note: This dictation was prepared with Dragon digital system. Any transcriptional errors that result from this procedure are unintentional.   History of Present Illness: This is a 74 year old white female with ulcerative colitis since 1972 followed by Dr.Wainer till 1982 when Dr. Velora Heckler started to follow her. She had multiple colonoscopies confirming pancolitis. Last exam was on  06/28/2014 and showed chronic mildly active colitis involving cecum, ascending colon, transverse and descending colon as well as sigmoid colon and rectum. There was no dysplasia. Patient has been reluctant to undergo total colectomy. She comes today to report that she is determined to follow our instructions which she in the past did not do well, from now on.There is a questionable  allergy to sulfa despite taking sulfasalazine for many years without any problems, usually with a low dose Prednisone by Dr Velora Heckler.. We restarted Prednisone after the colonoscopy at the dose 5 mg every other day and her  bleeding and diarrhea have completely resolved.     Past Medical History  Diagnosis Date  . Depression   . Hiatal hernia   . GERD (gastroesophageal reflux disease)   . Vitamin B12 deficiency   . Ulcerative colitis   . Benign neoplasm     of skin of trunk  . Pulmonary nodule, right 03/2011    Rt middle lobe 3 mm  . Colitis     Past Surgical History  Procedure Laterality Date  . Abdominal hysterectomy    . Appendectomy    . Breast surgery      Lumpectomy-Left breast  . Breast enhancement surgery    . Incision and drainage perirectal abscess N/A 09/28/2013    Procedure: IRRIGATION AND DEBRIDEMENT PERIANAL ABSCESS, proctoscopy;  Surgeon: Shann Medal, MD;  Location: [facility];  Service: General;  Laterality: N/A;    Allergies  Allergen Reactions  . Sulfonamide Derivatives Nausea And Vomiting, Swelling and Rash       . Codeine Nausea And Vomiting  . Mesalamine    questionable    Family history and social history have been reviewed.  Review of Systems: Denies abdominal pain diarrhea or rectal bleeding  The remainder of the 10 point ROS is negative except as outlined in the H&P  Physical Exam: General Appearance Well developed, in no distress Eyes  Non icteric  HEENT  Non traumatic, normocephalic  Mouth No lesion, tongue papillated, no cheilosis Neck Supple without adenopathy, thyroid not enlarged, no carotid bruits, no JVD Lungs Clear to auscultation bilaterally COR Normal S1, normal S2, regular rhythm, no murmur, quiet precordium Abdomen Soft nontender with normoactive bowel sounds  Rectal Soft Hemoccult negative stool  Extremities  No pedal edema Skin No lesions Neurological Alert and oriented x 3 Psychological Normal mood and affect  Assessment and Plan:   74 year old white female with 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 Be- cause of  financial restraints she will stay on sulfasalazine, initially  500 mg daily for a mnnth and this will be increased if she tolerated. She will also start prednisone 5 mg daily instead of qod.  I will see her in 3 months and reassess her status. Check bone density. At some point.  [name] 07/14/2014

## 2014-07-14 NOTE — Patient Instructions (Addendum)
We have sent the following medications to your pharmacy for you to pick up at your convenience:  Prednisone, Sulfasalazine  I will call in to remind you to schedule your 3 month follow up. Dr Etter Sjogren

## 2014-07-20 ENCOUNTER — Other Ambulatory Visit (INDEPENDENT_AMBULATORY_CARE_PROVIDER_SITE_OTHER): Payer: Commercial Managed Care - HMO

## 2014-07-20 DIAGNOSIS — E785 Hyperlipidemia, unspecified: Secondary | ICD-10-CM

## 2014-07-20 LAB — HEPATIC FUNCTION PANEL
ALT: 9 U/L (ref 0–35)
AST: 15 U/L (ref 0–37)
Albumin: 4 g/dL (ref 3.5–5.2)
Alkaline Phosphatase: 84 U/L (ref 39–117)
BILIRUBIN TOTAL: 0.5 mg/dL (ref 0.2–1.2)
Bilirubin, Direct: 0.1 mg/dL (ref 0.0–0.3)
Total Protein: 7.2 g/dL (ref 6.0–8.3)

## 2014-07-20 LAB — LIPID PANEL
CHOL/HDL RATIO: 3
Cholesterol: 166 mg/dL (ref 0–200)
HDL: 50.7 mg/dL (ref >39.00)
LDL Cholesterol: 81 mg/dL (ref 0–99)
NONHDL: 115.3
Triglycerides: 172 mg/dL — ABNORMAL HIGH (ref 0.0–149.0)
VLDL: 34.4 mg/dL (ref 0.0–40.0)

## 2014-08-02 ENCOUNTER — Telehealth: Payer: Self-pay | Admitting: *Deleted

## 2014-08-02 NOTE — Telephone Encounter (Signed)
If I wrote the prescription then We will keep refilling it.

## 2014-08-02 NOTE — Telephone Encounter (Signed)
Patient is requesting a refill for their Rx for zolpidem (AMBIEN), 5 mg, #30 with no refills. The last time you gave them this Rx was on 06/13/14. Their last office visit was on 07/14/14. Can the patient have a refill for this Rx or should their PCP give her a renewal on this Rx? Please advise.

## 2014-08-03 MED ORDER — ZOLPIDEM TARTRATE 5 MG PO TABS: 5.0000 mg | ORAL_TABLET | Freq: Every evening | ORAL | Status: DC | PRN

## 2014-08-03 NOTE — Telephone Encounter (Signed)
Per Dr. Nichola Sizer approval, sent Rx for zolpidem Lorrin Mais), 5 mg, #30 with no refills to [facility] in [place], [place] on 08/03/14.

## 2014-08-15 ENCOUNTER — Other Ambulatory Visit: Payer: Self-pay | Admitting: Family Medicine

## 2014-09-06 ENCOUNTER — Telehealth: Payer: Self-pay | Admitting: *Deleted

## 2014-09-06 MED ORDER — ZOLPIDEM TARTRATE 5 MG PO TABS: 5.0000 mg | ORAL_TABLET | Freq: Every evening | ORAL | Status: DC | PRN

## 2014-09-06 NOTE — Telephone Encounter (Signed)
Faxed over Rx for zolpidem (AMBIEN), 5 mg, #30 with no refills to [facility] in [place], [place].

## 2014-10-10 ENCOUNTER — Other Ambulatory Visit (INDEPENDENT_AMBULATORY_CARE_PROVIDER_SITE_OTHER): Payer: Commercial Managed Care - HMO

## 2014-10-10 ENCOUNTER — Encounter: Payer: Self-pay | Admitting: Internal Medicine

## 2014-10-10 ENCOUNTER — Ambulatory Visit (INDEPENDENT_AMBULATORY_CARE_PROVIDER_SITE_OTHER): Payer: Commercial Managed Care - HMO | Admitting: Internal Medicine

## 2014-10-10 VITALS — BP 138/72 | HR 84 | Ht 65.75 in | Wt 159.4 lb

## 2014-10-10 DIAGNOSIS — K51219 Ulcerative (chronic) proctitis with unspecified complications: Secondary | ICD-10-CM

## 2014-10-10 LAB — IBC PANEL
Iron: 164 ug/dL — ABNORMAL HIGH (ref 42–145)
Saturation Ratios: 48.2 % (ref 20.0–50.0)
Transferrin: 243 mg/dL (ref 212.0–360.0)

## 2014-10-10 LAB — CBC WITH DIFFERENTIAL/PLATELET
BASOS PCT: 0.4 % (ref 0.0–3.0)
Basophils Absolute: 0.1 10*3/uL (ref 0.0–0.1)
EOS ABS: 0.1 10*3/uL (ref 0.0–0.7)
EOS PCT: 1.2 % (ref 0.0–5.0)
HEMATOCRIT: 41.7 % (ref 36.0–46.0)
Hemoglobin: 14.1 g/dL (ref 12.0–15.0)
LYMPHS ABS: 2 10*3/uL (ref 0.7–4.0)
Lymphocytes Relative: 17.6 % (ref 12.0–46.0)
MCHC: 33.9 g/dL (ref 30.0–36.0)
MCV: 92.7 fl (ref 78.0–100.0)
Monocytes Absolute: 0.7 10*3/uL (ref 0.1–1.0)
Monocytes Relative: 6.1 % (ref 3.0–12.0)
NEUTROS ABS: 8.6 10*3/uL — AB (ref 1.4–7.7)
Neutrophils Relative %: 74.7 % (ref 43.0–77.0)
Platelets: 230 10*3/uL (ref 150.0–400.0)
RBC: 4.5 Mil/uL (ref 3.87–5.11)
RDW: 13.3 % (ref 11.5–15.5)
WBC: 11.5 10*3/uL — AB (ref 4.0–10.5)

## 2014-10-10 LAB — FOLATE: Folate: 24.8 ng/mL (ref >5.9)

## 2014-10-10 LAB — SEDIMENTATION RATE: SED RATE: 10 mm/h (ref 0–22)

## 2014-10-10 LAB — VITAMIN B12: Vitamin B-12: 438 pg/mL (ref 211–911)

## 2014-10-10 MED ORDER — PREDNISONE 5 MG PO TABS: 2.5000 mg | ORAL_TABLET | Freq: Every day | ORAL | Status: DC

## 2014-10-10 MED ORDER — SULFASALAZINE 500 MG PO TABS: 500.0000 mg | ORAL_TABLET | Freq: Two times a day (BID) | ORAL | Status: DC

## 2014-10-10 MED ORDER — ZOLPIDEM TARTRATE 5 MG PO TABS: 5.0000 mg | ORAL_TABLET | Freq: Every evening | ORAL | Status: DC | PRN

## 2014-10-10 NOTE — Patient Instructions (Addendum)
Dr Etter Sjogren We have sent  medications to your pharmacy for you to pick up at your convenience. Your physician has requested that you go to the basement for lab work before leaving today. Follow  Up with Dr Olevia Perches

## 2014-10-10 NOTE — Progress Notes (Signed)
Sara Barnes 1940/06/18 [id number]  Note: This dictation was prepared with Dragon digital system. Any transcriptional errors that result from this procedure are unintentional.   History of Present Illness: This is a 74 year old white female with ulcerative colitis since 54. Initially followed by Dr.Wainer  and after 1982 by Dr.Fairview. I have been following her for past several years and she had a recent colonoscopy in March 2016 which showed pancolitis. Biopsies revealed chronic mildly active colitis involving cecum, ascending, transverse, descending and sigmoid colon as well as the rectum. There was no dysplasia. Patient was  offered total colectomy and ileoproctostomy but she declined, she prefers surveillance screening for neoplasia. She  reported to have been allergic to sulfa although she took sulfasalazine in the past. We started her on Sulfasalazine 500 mg daily which she has been tolerating very well, also  prednisone 5 mg a day which was tapered to 2 and half milligrams a day  last week. Overall she is feeling very well. She has about 2 bowel movements a day no nocturnal bowel movements and no urgency. She was treated for perirectal abscess in June 2015. Bone density October 2014 show  low bone mass .     Past Medical History  Diagnosis Date  . Depression   . Hiatal hernia   . GERD (gastroesophageal reflux disease)   . Vitamin B12 deficiency   . Ulcerative colitis   . Benign neoplasm     of skin of trunk  . Pulmonary nodule, right 03/2011    Rt middle lobe 3 mm  . Colitis     Past Surgical History  Procedure Laterality Date  . Abdominal hysterectomy    . Appendectomy    . Breast surgery      Lumpectomy-Left breast  . Breast enhancement surgery    . Incision and drainage perirectal abscess N/A 09/28/2013    Procedure: IRRIGATION AND DEBRIDEMENT PERIANAL ABSCESS, proctoscopy;  Surgeon: Shann Medal, MD;  Location: [facility];  Service: General;  Laterality: N/A;    Allergies   Allergen Reactions  . Sulfonamide Derivatives Nausea And Vomiting, Swelling and Rash       . Codeine Nausea And Vomiting  . Mesalamine     questionable    Family history and social history have been reviewed.  Review of Systems:   The remainder of the 10 point ROS is negative except as outlined in the H&P  Physical Exam: General Appearance Well developed, in no distress, hard of hearing  Eyes  Non icteric  HEENT  Non traumatic, normocephalic  Mouth No lesion, tongue papillated, no cheilosis Neck Supple without adenopathy, thyroid not enlarged, no carotid bruits, no JVD Lungs Clear to auscultation bilaterally COR Normal S1, normal S2, regular rhythm, no murmur, quiet precordium Abdomen  mildly protuberant. Soft with normoactive bowel sounds. No tenderness  Rectal  not done  Extremities  No pedal edema Skin No lesions Neurological Alert and oriented x 3 Psychological Normal mood and affect  Assessment and Plan:   74 year old white female with the ulcerative pancolitisr for 40 years duration. Patient is dec lining total colectomy. There has been no dysplasia on recent colonoscopy. Recall colonoscopy planned for October 2018. Today we will check CBC, sedimentation rate, iron studies and B12. She will increase sulfasalazine to 1000 mg daily with folic acid. Decrease  prednisone to 2 and half milligrams daily. I will see her in 3 months. Also refill on Ambien     [name] 10/10/2014

## 2014-10-23 ENCOUNTER — Other Ambulatory Visit: Payer: Self-pay | Admitting: Family Medicine

## 2014-11-23 ENCOUNTER — Ambulatory Visit (INDEPENDENT_AMBULATORY_CARE_PROVIDER_SITE_OTHER): Payer: Commercial Managed Care - HMO | Admitting: Family Medicine

## 2014-11-23 ENCOUNTER — Encounter: Payer: Self-pay | Admitting: Family Medicine

## 2014-11-23 VITALS — BP 120/78 | HR 66 | Temp 98.2°F | Wt 155.0 lb

## 2014-11-23 DIAGNOSIS — N63 Unspecified lump in unspecified breast: Secondary | ICD-10-CM

## 2014-11-23 NOTE — Progress Notes (Signed)
Pre visit review using our clinic review tool, if applicable. No additional management support is needed unless otherwise documented below in the visit note. 

## 2014-11-23 NOTE — Progress Notes (Signed)
Patient ID: Sara Barnes, female    DOB: Dec 06, 1940  Age: 74 y.o. MRN: [id number]    Subjective:  Subjective HPI Sara Barnes presents for mass in L breast that she noticed after this weekend.  It is under the nipple in L breast.    Review of Systems  Constitutional: Negative for diaphoresis, appetite change, fatigue and unexpected weight change.  Eyes: Negative for pain, redness and visual disturbance.  Respiratory: Negative for cough, chest tightness, shortness of breath and wheezing.   Cardiovascular: Negative for chest pain, palpitations and leg swelling.  Endocrine: Negative for cold intolerance, heat intolerance, polydipsia, polyphagia and polyuria.  Genitourinary: Negative for dysuria, frequency and difficulty urinating.  Neurological: Negative for dizziness, light-headedness, numbness and headaches.    History Past Medical History  Diagnosis Date  . Depression   . Hiatal hernia   . GERD (gastroesophageal reflux disease)   . Vitamin B12 deficiency   . Ulcerative colitis   . Benign neoplasm     of skin of trunk  . Pulmonary nodule, right 03/2011    Rt middle lobe 3 mm  . Colitis     She has past surgical history that includes Abdominal hysterectomy; Appendectomy; Breast surgery; Breast enhancement surgery; and Incision and drainage perirectal abscess (N/A, 09/28/2013).   Her family history includes Asthma in her sister; Diabetes in her brother; Emphysema in her maternal grandfather; Hypertension in an other family member; Kidney disease in her brother; Other in her father; Prostate cancer (age of onset: 86) in her brother; Rheum arthritis in her brother and mother. There is no history of Colon cancer, Esophageal cancer, Stomach cancer, or Rectal cancer.She reports that she has never smoked. She has never used smokeless tobacco. She reports that she drinks alcohol. She reports that she does not use illicit drugs.  Current Outpatient Prescriptions on File Prior to Visit    Medication Sig Dispense Refill  . atorvastatin (LIPITOR) 20 MG tablet TAKE 1 TABLET (20 MG TOTAL) BY MOUTH DAILY. (Patient taking differently: TAKE 1/2 TABLET (20 MG TOTAL) BY MOUTH 2-3 TIMES A WEEK) 30 tablet 0  . cyanocobalamin (,VITAMIN B-12,) 1000 MCG/ML injection INJECT 1 ML (1,000 MCG TOTAL) INTO THE MUSCLE EVERY 30 (THIRTY) DAYS. 3 mL 1  . FLUoxetine (PROZAC) 20 MG capsule TAKE ONE CAPSULE BY MOUTH ONCE DAILY 90 capsule 1  . folic acid (FOLVITE) 1 MG tablet Take 1 tablet (1 mg total) by mouth daily. 100 tablet 0  . ibuprofen (ADVIL,MOTRIN) 200 MG tablet Take 400 mg by mouth every 6 (six) hours as needed for mild pain (pain).     . predniSONE (DELTASONE) 5 MG tablet Take 0.5 tablets (2.5 mg total) by mouth daily with breakfast. 100 tablet 3  . sulfaSALAzine (AZULFIDINE) 500 MG tablet Take 1 tablet (500 mg total) by mouth 2 (two) times daily. 60 tablet 3   No current facility-administered medications on file prior to visit.     Objective:  Objective Physical Exam  Constitutional: She is oriented to person, place, and time. She appears well-developed and well-nourished.  HENT:  Head: Normocephalic and atraumatic.  Eyes: Conjunctivae and EOM are normal.  Neck: Normal range of motion. Neck supple. No JVD present. Carotid bruit is not present. No thyromegaly present.  Cardiovascular: Normal rate, regular rhythm and normal heart sounds.   No murmur heard. Pulmonary/Chest: Effort normal and breath sounds normal. No respiratory distress. She has no wheezes. She has no rales. She exhibits no tenderness. Right breast exhibits no  inverted nipple, no mass, no nipple discharge, no skin change and no tenderness. Left breast exhibits mass. Left breast exhibits no inverted nipple, no nipple discharge, no skin change and no tenderness.    Musculoskeletal: She exhibits no edema.  Neurological: She is alert and oriented to person, place, and time.  Psychiatric: She has a normal mood and affect.    BP 120/78 mmHg  Pulse 66  Temp(Src) 98.2 F (36.8 C) (Oral)  Wt 155 lb (70.308 kg)  SpO2 99% Wt Readings from Last 3 Encounters:  11/23/14 155 lb (70.308 kg)  10/10/14 159 lb 6 oz (72.292 kg)  07/14/14 156 lb (70.761 kg)     Lab Results  Component Value Date   WBC 11.5* 10/10/2014   HGB 14.1 10/10/2014   HCT 41.7 10/10/2014   PLT 230.0 10/10/2014   GLUCOSE 93 01/26/2014   CHOL 166 07/20/2014   TRIG 172.0* 07/20/2014   HDL 50.70 07/20/2014   LDLDIRECT 108.6 01/26/2014   LDLCALC 81 07/20/2014   ALT 9 07/20/2014   AST 15 07/20/2014   NA 135 01/26/2014   K 4.0 01/26/2014   CL 103 01/26/2014   CREATININE 0.7 01/26/2014   BUN 10 01/26/2014   CO2 25 01/26/2014   TSH 1.24 01/03/2013   INR 1.05 09/27/2013   HGBA1C 5.5 12/18/2010   MICROALBUR 5.1 Repeated and verified X2.* 01/03/2013    Mm Digital Screening W/ Implants Bilateral  04/25/2014   CLINICAL DATA:  Screening.  EXAM: DIGITAL SCREENING BILATERAL MAMMOGRAM WITH IMPLANTS AND CAD  The patient has retropectoral implants. Standard and implant displaced views were performed.  COMPARISON:  Previous exam(s)  ACR Breast Density Category c: The breast tissue is heterogeneously dense, which may obscure small masses.  FINDINGS: There are no findings suspicious for malignancy. Images were processed with CAD.  IMPRESSION: No mammographic evidence of malignancy. A result letter of this screening mammogram will be mailed directly to the patient.  RECOMMENDATION: Screening mammogram in one year. (Code:SM-B-01Y)  BI-RADS CATEGORY  1:  Negative.   Electronically Signed   By: Enrique Sack M.D.   On: 04/25/2014 16:13     Assessment & Plan:  Plan I have discontinued Ms. Cohick's zolpidem. I am also having her maintain her ibuprofen, atorvastatin, folic acid, FLUoxetine, sulfaSALAzine, predniSONE, and cyanocobalamin.  No orders of the defined types were placed in this encounter.    Problem List Items Addressed This Visit    None     Visit Diagnoses    Breast mass in female    -  Primary    Relevant Orders    MM Digital Diagnostic Bilat    US BREAST COMPLETE UNI LEFT INC AXILLA       Follow-up: Return if symptoms worsen or fail to improve.  [name], DO

## 2014-11-28 ENCOUNTER — Ambulatory Visit
Admission: RE | Admit: 2014-11-28 | Discharge: 2014-11-28 | Disposition: A | Discharge status: Home / Self Care | Payer: Commercial Managed Care - HMO | Source: Ambulatory Visit | Attending: Family Medicine | Admitting: Family Medicine

## 2014-11-28 ENCOUNTER — Other Ambulatory Visit: Payer: Self-pay | Admitting: Family Medicine

## 2014-11-28 DIAGNOSIS — N63 Unspecified lump in unspecified breast: Secondary | ICD-10-CM

## 2014-11-28 DIAGNOSIS — C50312 Malignant neoplasm of lower-inner quadrant of left female breast: Secondary | ICD-10-CM | POA: Diagnosis not present

## 2014-11-30 ENCOUNTER — Encounter: Payer: Self-pay | Admitting: *Deleted

## 2014-11-30 ENCOUNTER — Telehealth: Payer: Self-pay | Admitting: *Deleted

## 2014-11-30 DIAGNOSIS — C50312 Malignant neoplasm of lower-inner quadrant of left female breast: Secondary | ICD-10-CM

## 2014-11-30 DIAGNOSIS — C50319 Malignant neoplasm of lower-inner quadrant of unspecified female breast: Secondary | ICD-10-CM

## 2014-11-30 HISTORY — DX: Malignant neoplasm of lower-inner quadrant of unspecified female breast: C50.319

## 2014-11-30 HISTORY — DX: Malignant neoplasm of lower-inner quadrant of left female breast: C50.312

## 2014-11-30 NOTE — Telephone Encounter (Signed)
Confirmed [facility] for 12/06/14 at 0800 .  Instructions and contact information given.

## 2014-12-01 ENCOUNTER — Telehealth: Payer: Self-pay | Admitting: Family Medicine

## 2014-12-01 DIAGNOSIS — C50312 Malignant neoplasm of lower-inner quadrant of left female breast: Secondary | ICD-10-CM

## 2014-12-01 NOTE — Telephone Encounter (Signed)
Ref placed.      KP 

## 2014-12-01 NOTE — Telephone Encounter (Signed)
Craig Relation to pt: Call back [place]:  Reason for call: pt is needing a referral to the cancer center, pt has appt on 12/06/14, pt has [facility]). Pt will see Dr. Krista Blue NPI [id number]. Dx code [phone]

## 2014-12-06 ENCOUNTER — Encounter: Payer: Self-pay | Admitting: Adult Health

## 2014-12-06 ENCOUNTER — Encounter: Payer: Self-pay | Admitting: *Deleted

## 2014-12-06 ENCOUNTER — Other Ambulatory Visit (HOSPITAL_BASED_OUTPATIENT_CLINIC_OR_DEPARTMENT_OTHER): Payer: Commercial Managed Care - HMO

## 2014-12-06 ENCOUNTER — Encounter: Payer: Self-pay | Admitting: Physical Therapy

## 2014-12-06 ENCOUNTER — Ambulatory Visit (HOSPITAL_BASED_OUTPATIENT_CLINIC_OR_DEPARTMENT_OTHER): Payer: Commercial Managed Care - HMO | Admitting: Hematology

## 2014-12-06 ENCOUNTER — Encounter: Payer: Self-pay | Admitting: Hematology

## 2014-12-06 ENCOUNTER — Other Ambulatory Visit: Payer: Self-pay | Admitting: General Surgery

## 2014-12-06 ENCOUNTER — Ambulatory Visit: Payer: Commercial Managed Care - HMO | Attending: General Surgery | Admitting: Physical Therapy

## 2014-12-06 ENCOUNTER — Ambulatory Visit: Payer: Commercial Managed Care - HMO

## 2014-12-06 ENCOUNTER — Encounter: Payer: Self-pay | Admitting: Skilled Nursing Facility1

## 2014-12-06 ENCOUNTER — Ambulatory Visit
Admission: RE | Admit: 2014-12-06 | Discharge: 2014-12-06 | Disposition: A | Discharge status: Home / Self Care | Payer: Commercial Managed Care - HMO | Source: Ambulatory Visit | Attending: Radiation Oncology | Admitting: Radiation Oncology

## 2014-12-06 VITALS — BP 102/61 | HR 74 | Temp 97.9°F | Resp 18 | Ht 65.75 in | Wt 157.7 lb

## 2014-12-06 DIAGNOSIS — C50312 Malignant neoplasm of lower-inner quadrant of left female breast: Secondary | ICD-10-CM | POA: Insufficient documentation

## 2014-12-06 DIAGNOSIS — K529 Noninfective gastroenteritis and colitis, unspecified: Secondary | ICD-10-CM | POA: Diagnosis not present

## 2014-12-06 DIAGNOSIS — F329 Major depressive disorder, single episode, unspecified: Secondary | ICD-10-CM | POA: Diagnosis not present

## 2014-12-06 DIAGNOSIS — Z170 Estrogen receptor positive status [ER+]: Secondary | ICD-10-CM | POA: Diagnosis not present

## 2014-12-06 LAB — COMPREHENSIVE METABOLIC PANEL (CC13)
ALT: 10 U/L (ref 0–55)
ANION GAP: 7 meq/L (ref 3–11)
AST: 9 U/L (ref 5–34)
Albumin: 3.3 g/dL — ABNORMAL LOW (ref 3.5–5.0)
Alkaline Phosphatase: 89 U/L (ref 40–150)
BILIRUBIN TOTAL: 0.37 mg/dL (ref 0.20–1.20)
BUN: 12.3 mg/dL (ref 7.0–26.0)
CO2: 25 mEq/L (ref 22–29)
Calcium: 9.4 mg/dL (ref 8.4–10.4)
Chloride: 106 mEq/L (ref 98–109)
Creatinine: 0.8 mg/dL (ref 0.6–1.1)
EGFR: 78 mL/min/{1.73_m2} — ABNORMAL LOW (ref >90)
Glucose: 92 mg/dl (ref 70–140)
Potassium: 4.1 mEq/L (ref 3.5–5.1)
Sodium: 138 mEq/L (ref 136–145)
TOTAL PROTEIN: 6.5 g/dL (ref 6.4–8.3)

## 2014-12-06 LAB — CBC WITH DIFFERENTIAL/PLATELET
BASO%: 0.4 % (ref 0.0–2.0)
BASOS ABS: 0 10*3/uL (ref 0.0–0.1)
EOS%: 2.2 % (ref 0.0–7.0)
Eosinophils Absolute: 0.2 10*3/uL (ref 0.0–0.5)
HEMATOCRIT: 41.1 % (ref 34.8–46.6)
HEMOGLOBIN: 13.7 g/dL (ref 11.6–15.9)
LYMPH#: 1.8 10*3/uL (ref 0.9–3.3)
LYMPH%: 20 % (ref 14.0–49.7)
MCH: 31.9 pg (ref 25.1–34.0)
MCHC: 33.3 g/dL (ref 31.5–36.0)
MCV: 95.8 fL (ref 79.5–101.0)
MONO#: 0.8 10*3/uL (ref 0.1–0.9)
MONO%: 8.4 % (ref 0.0–14.0)
NEUT#: 6.3 10*3/uL (ref 1.5–6.5)
NEUT%: 69 % (ref 38.4–76.8)
Platelets: 303 10*3/uL (ref 145–400)
RBC: 4.29 10*6/uL (ref 3.70–5.45)
RDW: 12.2 % (ref 11.2–14.5)
WBC: 9.2 10*3/uL (ref 3.9–10.3)

## 2014-12-06 NOTE — Progress Notes (Signed)
CHECKED IN PATIENT FOR BREAST CLINIC. PT WAS GIVEN [employer]'S CARD FOR ANY FINANCIAL QUESTIONS OR CONCERNS. PT STATES SHE HAD ALREADY PAID HER COPAY OF $45. PT SIGNED BOTH RELEASE FORMS AND WAS GIVEN PACKET.

## 2014-12-06 NOTE — Progress Notes (Signed)
Subjective:     Patient ID: Sara Barnes, female   DOB: November 25, 1940, 74 y.o.   MRN: [id number]  HPI   Review of Systems     Objective:   Physical Exam For the patient to understand and be given the tools to implement a healthy plant based diet during their cancer diagnosis.     Assessment:     Patient was seen today and found to be in good spirits and accompanied by her friend. Pts wt 157 pounds, BMI 25.7, and  5'6''. Pts labs: albumin 3.3. Pt had questions about soy and superfoods. Pt seemed to have a little trouble understanding the information but her friend was very helpful in helping the pt to understand.     Plan:     Dietitian educated the patient on implementing a plant based diet by incorporating more plant proteins, fruits, and vegetables. As a part of a healthy routine physical activity was discussed. Dietitian educated the pt on soy and its recommendations as well as all plant foods being an important part of a healthy and varied diet. The importance of legitimate, evidence based information was discussed and examples were given. A folder of evidence based information with a focus on a plant based diet and general nutrition during cancer was given to the patient.  As a part of the continuum of care the cancer dietitian's contact information was given to the patient in the event they would like to have a follow up appointment.

## 2014-12-06 NOTE — Progress Notes (Signed)
[place]  [phone] Fax:([phone]) [place] Note   Patient Care Team: Rosalita Chessman, DO as PCP - General [place] Dragon, MD as Consulting Physician (Gastroenterology) [name], MD as Consulting Physician (General Surgery) Truitt Merle, MD as Consulting Physician (Hematology) Thea Silversmith, MD as Consulting Physician (Radiation Oncology) Mauro Kaufmann, RN as Registered Nurse Rockwell Germany, RN as Registered Nurse 12/06/2014  CHIEF COMPLAINTS/PURPOSE OF CONSULTATION:  Newly diagnosed left breast cancer     Oncology History   Cancer of lower-inner quadrant of left female breast   Staging form: Breast, AJCC 7th Edition     Clinical stage from 12/06/2014: Stage IA (T1c, N0, M0) - Unsigned       Cancer of lower-inner quadrant of left female breast   11/28/2014 Breast US Highly suspicious 1.6 cm irregular shadowing mass at the site of palpable concern in the subareolar lower inner quadrant of the left breast at approximately 630.   11/29/2014 Receptors her2 Estrogen Receptor: 100%, POSITIVE, STRONG STAINING INTENSITY (PERFORMED MANUALLY) Progesterone Receptor: 95%, POSITIVE, STRONG STAINING INTENSITY (PERFORMED MANUALLY) Proliferation Marker Ki67: 10% (PERFORMED MANUALLY), Her2neu negative   11/29/2014 Initial Biopsy Breast, left, needle core biopsy, subareolar about 6:30 - INVASIVE DUCTAL CARCINOMA, SEE COMMENT.   11/30/2014 Initial Diagnosis Cancer of lower-inner quadrant of left female breast    HISTORY OF PRESENTING ILLNESS:  Sara Barnes 74 y.o. female is here because of newly diagnosed left breast cancer. She presents to our multidisciplinary breast clinic with her sister today.  She had a plpable left breast mass 3 weeks ago, nontender, no significant skin or nipple change, she also reports some left arm discomfort lately, no swelling or limitation on the range of motion. She otherwise feels well, no other complains. She is little bit  harder hearing.  She had benign left breast lesion long time ago and had bilateral inplants placed when when she was 59. She is postmenopausal, never taken any hormonal replacement, no family history of breast cancer.  She has colitis, followed by Dr. Cathlean Cower, she has intermittent diarrhea, she was on low dose prednisone and sulidine until a month ago. She is physically active, has good appetite and energy level. She lives alone, her sister lives very close to her. She has one son who lives in [place] also.  MEDICAL HISTORY:  Past Medical History  Diagnosis Date  . Depression   . Hiatal hernia   . GERD (gastroesophageal reflux disease)   . Vitamin B12 deficiency   . Ulcerative colitis   . Benign neoplasm     of skin of trunk  . Pulmonary nodule, right 03/2011    Rt middle lobe 3 mm  . Colitis   . Cancer of lower-inner quadrant of female breast 11/30/2014  . Arthritis   . Hot flashes     SURGICAL HISTORY: Past Surgical History  Procedure Laterality Date  . Abdominal hysterectomy    . Appendectomy    . Breast surgery      Lumpectomy-Left breast  . Breast enhancement surgery    . Incision and drainage perirectal abscess N/A 09/28/2013    Procedure: IRRIGATION AND DEBRIDEMENT PERIANAL ABSCESS, proctoscopy;  Surgeon: Shann Medal, MD;  Location: [facility];  Service: General;  Laterality: N/A;   GYN HISTORY  Menarchal: 12 LMP: 52 (hysterectomy) Contraceptive: 2 years  HRT: no  G1P1: one son 21 yo, no breast feeding    SOCIAL HISTORY: Social History   Social History  .  Marital Status: Widowed    Spouse Name: N/A  . Number of Children: N/A  . Years of Education: N/A   Occupational History  . 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 Smoking status: Never Smoker   . Smokeless tobacco: Never Used  . Alcohol Use: 1.2 - 1.8 oz/week    2-3 Glasses of wine per week     Comment: wine-with dinner daily   . Drug Use: No  . Sexual Activity:     Partners: Male   Other Topics Concern  . Not on file   Social History Narrative   Treadmill/ walking--exercise    FAMILY HISTORY: Family History  Problem Relation Age of Onset  . Hypertension    . Prostate cancer Brother 70  . Kidney disease Brother   . Diabetes Brother   . Cancer Brother     prostate cancer and melanoma   . Other Father     Brain Tumor  . Cancer Father     brain cancer   . Rheum arthritis Mother   . Rheum arthritis Brother   . Emphysema Maternal Grandfather   . Asthma Sister   . Colon cancer Neg Hx   . Esophageal cancer Neg Hx   . Stomach cancer Neg Hx   . Rectal cancer Neg Hx     ALLERGIES:  is allergic to sulfonamide derivatives; codeine; and mesalamine.  MEDICATIONS:  Current Outpatient Prescriptions  Medication Sig Dispense Refill  . cyanocobalamin (,VITAMIN B-12,) 1000 MCG/ML injection INJECT 1 ML (1,000 MCG TOTAL) INTO THE MUSCLE EVERY 30 (THIRTY) DAYS. 3 mL 1  . FLUoxetine (PROZAC) 20 MG capsule TAKE ONE CAPSULE BY MOUTH ONCE DAILY 90 capsule 1  . ibuprofen (ADVIL,MOTRIN) 200 MG tablet Take 400 mg by mouth every 6 (six) hours as needed for mild pain (pain).      No current facility-administered medications for this visit.    REVIEW OF SYSTEMS:   Constitutional: Denies fevers, chills or abnormal night sweats Eyes: Denies blurriness of vision, double vision or watery eyes Ears, nose, mouth, throat, and face: Denies mucositis or sore throat Respiratory: Denies cough, dyspnea or wheezes Cardiovascular: Denies palpitation, chest discomfort or lower extremity swelling Gastrointestinal:  Denies nausea, heartburn or change in bowel habits Skin: Denies abnormal skin rashes Lymphatics: Denies new lymphadenopathy or easy bruising Neurological:Denies numbness, tingling or new weaknesses Behavioral/Psych: Mood is stable, no new changes  All other systems were reviewed with the patient and are negative.  PHYSICAL EXAMINATION: ECOG PERFORMANCE  STATUS: 0 - Asymptomatic  Filed Vitals:   12/06/14 0828  BP: 102/61  Pulse: 74  Temp: 97.9 F (36.6 C)  Resp: 18   Filed Weights   12/06/14 0828  Weight: 157 lb 11.2 oz (71.532 kg)    GENERAL:alert, no distress and comfortable SKIN: skin color, texture, turgor are normal, no rashes or significant lesions EYES: normal, conjunctiva are pink and non-injected, sclera clear OROPHARYNX:no exudate, no erythema and lips, buccal mucosa, and tongue normal  NECK: supple, thyroid normal size, non-tender, without nodularity LYMPH:  no palpable lymphadenopathy in the cervical, axillary or inguinal LUNGS: clear to auscultation and percussion with normal breathing effort HEART: regular rate & rhythm and no murmurs and no lower extremity edema ABDOMEN:abdomen soft, non-tender and normal bowel sounds Musculoskeletal:no cyanosis of digits and no clubbing  PSYCH: alert & oriented x 3 with fluent speech NEURO: no focal motor/sensory deficits Breasts: Breast inspection showed them to  be symmetrical with no nipple discharge. (+) skin bruise is in the left breast biopsy site. Palpation of the left breast reveals a 1.5 x 1.5 cm mass right below the nipple, nontender, no skin erythema or nipple retraction or discharge. Palpitation of the right breast and axilla revealed no obvious mass that I could appreciate.   LABORATORY DATA:  I have reviewed the data as listed Lab Results  Component Value Date   WBC 9.2 12/06/2014   HGB 13.7 12/06/2014   HCT 41.1 12/06/2014   MCV 95.8 12/06/2014   PLT 303 12/06/2014    Recent Labs  01/26/14 1143 07/20/14 1111 12/06/14 0804  NA 135  --  138  K 4.0  --  4.1  CL 103  --   --   CO2 25  --  25  GLUCOSE 93  --  92  BUN 10  --  12.3  CREATININE 0.7  --  0.8  CALCIUM 9.4  --  9.4  PROT 7.9 7.2 6.5  ALBUMIN 4.1 4.0 3.3*  AST 17 15 9   ALT 14 9 10   ALKPHOS 92 84 89  BILITOT 0.7 0.5 0.37  BILIDIR 0.1 0.1  --    PATHOLOGY REPORT Diagnosis  11/28/2014 Breast, left, needle core biopsy, subareolar about 6:30 - INVASIVE DUCTAL CARCINOMA, SEE COMMENT. - POSITIVE FOR LYMPH VASCULAR INVASION. Microscopic Comment Although the grade of tumor is best assessed at resection, with these biopsies, the invasive tumor is grade 1. Breast prognostic studies are pending and will be reported in an addendum. The case was reviewed with Dr. Lyndon Code who concurs. (CR:kh 11-29-14) Results: IMMUNOHISTOCHEMICAL AND MORPHOMETRIC ANALYSIS BY THE AUTOMATED CELLULAR IMAGING SYSTEM (ACIS) Estrogen Receptor: 100%, POSITIVE, STRONG STAINING INTENSITY (PERFORMED MANUALLY) Progesterone Receptor: 95%, POSITIVE, STRONG STAINING INTENSITY (PERFORMED MANUALLY) Proliferation Marker Ki67: 10% (PERFORMED MANUALLY)  RADIOGRAPHIC STUDIES: I have personally reviewed the radiological images as listed and agreed with the findings in the report. Mm Digital Diagnostic Unilat L  11/28/2014   CLINICAL DATA:  Post biopsy mammogram following ultrasound-guided core needle biopsy of a subareolar palpable mass at 630 in the left breast.  EXAM: DIAGNOSTIC left breast MAMMOGRAM POST ULTRASOUND BIOPSY  COMPARISON:  Previous exam(s).  FINDINGS: Mammographic images were obtained following ultrasound guided biopsy of a left breast mass at the 630 subareolar location. A ribbon shaped biopsy marking clip is appropriately placed at the site of biopsy.  IMPRESSION: Appropriately positioned ribbon shaped biopsy marking clip at the site of biopsy at 630 in the subareolar left breast.  Final Assessment: Post Procedure Mammograms for Marker Placement   Electronically Signed   By: Ammie Ferrier M.D.   On: 11/28/2014 13:29   US Breast Ltd Uni Left Inc Axilla  11/28/2014   CLINICAL DATA:  74 year old female presenting for evaluation of a palpable abnormality in the left breast.  EXAM: DIGITAL DIAGNOSTIC bilateral MAMMOGRAM WITH IMPLANTS, 3D TOMOSYNTHESIS AND CAD  ULTRASOUND left BREAST  The patient has  retropectoral implants. Standard and implant displaced views were performed.  COMPARISON:  Previous exam(s).  ACR Breast Density Category c: The breast tissue is heterogeneously dense, which may obscure small masses.  FINDINGS: In the slightly lower slightly inner quadrant of the left breast, middle depth, there is an irregular mass measuring approximately 1.7 cm.  On physical exam, a small firm mass is palpated in the subareolar left breast in the lower inner quadrant at the site of the patient's palpable concern.  Targeted ultrasound is performed, showing an irregular hypoechoic  shadowing mass measuring approximately 1.6 cm in maximal dimension (oblique). In the standard radial and anti radial planes, the mass measures 1.3 x 0.7 x 0.6 cm. No abnormal lymph nodes were identified on sonography of the left axilla.  Mammographic images were processed with CAD.  IMPRESSION: 1. Highly suspicious 1.6 cm irregular shadowing mass at the site of palpable concern in the subareolar lower inner quadrant of the left breast at approximately 630.  2.   No suspicious left axillary lymph nodes are identified.  RECOMMENDATION: 1. Ultrasound-guided biopsy of the 1.6 cm mass in the subareolar lower inner quadrant of the left breast is recommended. The biopsy was performed same day, and details are described in a separate report.  I have discussed the findings and recommendations with the patient. Results were also provided in writing at the conclusion of the visit. If applicable, a reminder letter will be sent to the patient regarding the next appointment.  BI-RADS CATEGORY  5: Highly suggestive of malignancy.   Electronically Signed   By: Ammie Ferrier M.D.   On: 11/28/2014 13:23   Mm Diag Breast W/implant Tomo Bilateral  11/28/2014   CLINICAL DATA:  74 year old female presenting for evaluation of a palpable abnormality in the left breast.  EXAM: DIGITAL DIAGNOSTIC bilateral MAMMOGRAM WITH IMPLANTS, 3D TOMOSYNTHESIS AND CAD   ULTRASOUND left BREAST  The patient has retropectoral implants. Standard and implant displaced views were performed.  COMPARISON:  Previous exam(s).  ACR Breast Density Category c: The breast tissue is heterogeneously dense, which may obscure small masses.  FINDINGS: In the slightly lower slightly inner quadrant of the left breast, middle depth, there is an irregular mass measuring approximately 1.7 cm.  On physical exam, a small firm mass is palpated in the subareolar left breast in the lower inner quadrant at the site of the patient's palpable concern.  Targeted ultrasound is performed, showing an irregular hypoechoic shadowing mass measuring approximately 1.6 cm in maximal dimension (oblique). In the standard radial and anti radial planes, the mass measures 1.3 x 0.7 x 0.6 cm. No abnormal lymph nodes were identified on sonography of the left axilla.  Mammographic images were processed with CAD.  IMPRESSION: 1. Highly suspicious 1.6 cm irregular shadowing mass at the site of palpable concern in the subareolar lower inner quadrant of the left breast at approximately 630.  2.   No suspicious left axillary lymph nodes are identified.  RECOMMENDATION: 1. Ultrasound-guided biopsy of the 1.6 cm mass in the subareolar lower inner quadrant of the left breast is recommended. The biopsy was performed same day, and details are described in a separate report.  I have discussed the findings and recommendations with the patient. Results were also provided in writing at the conclusion of the visit. If applicable, a reminder letter will be sent to the patient regarding the next appointment.  BI-RADS CATEGORY  5: Highly suggestive of malignancy.   Electronically Signed   By: Ammie Ferrier M.D.   On: 11/28/2014 13:23   [place] Lt Breast Bx W Loc Dev 1st Lesion Img Bx Spec US Guide  11/29/2014   ADDENDUM REPORT: 11/29/2014 15:05  ADDENDUM: Pathology reveals Grade I Left breast invasive ductal carcinoma, positive for lymph vascular  invasion. This was found to be concordant by Dr. Ammie Ferrier. Pathology results were discussed with the patient via telephone. The patient reported no problems at the biopsy site and is doing well. Post biopsy instructions were reviewed and questions were answered. The patient was encouraged to  call [facility] with any additional questions and or concerns. Educational materials were left at the front desk of [facility] for the patient to pick up at a later date. The patient was referred to [facility] on December 06, 2014.  Pathology results reported by Terie Purser RN on November 29, 2014.   Electronically Signed   By: Ammie Ferrier M.D.   On: 11/29/2014 15:05   11/29/2014   CLINICAL DATA:  74 year old female with a palpable abnormality in the lower inner quadrant of the left breast. Workup reveals a 1.6 cm mass at approximately 630 in the subareolar left breast.  EXAM: ULTRASOUND GUIDED left BREAST CORE NEEDLE BIOPSY  COMPARISON:  Previous exam(s).  FINDINGS: I met with the patient and we discussed the procedure of ultrasound-guided biopsy, including benefits and alternatives. We discussed the high likelihood of a successful procedure. We discussed the risks of the procedure, including infection, bleeding, tissue injury, clip migration, implant rupture and inadequate sampling. Informed written consent was given. The usual time-out protocol was performed immediately prior to the procedure.  Using sterile technique and 1% Lidocaine as local anesthetic, under direct ultrasound visualization, a 12 gauge spring-loaded device was used to perform biopsy of an irregular shadowing mass at 630 in the subareolar left breast using a lateral approach. At the conclusion of the procedure a ribbon shaped tissue marker clip was deployed into the biopsy cavity. Follow up 2 view mammogram was performed and dictated separately.  IMPRESSION: Ultrasound  guided biopsy of the palpable left breast mass in the subareolar breast at approximately 630. No apparent complications.  Electronically Signed: By: Ammie Ferrier M.D. On: 11/28/2014 13:26    ASSESSMENT & PLAN:  74 year old patient female, with past medical history of colitis, depression, otherwise in good health, who presents with a palpable left breast mass.  1. Left breast invasive ductal carcinoma, cT1bN0M0, stage IA, ER+/PR+, HER2 (-) -I reviewed her mammograms, ultrasound, and the biopsy findings with her in great details. -Giving the early stage breast cancer, strong ER/PR positivity, low grade tumor, she is likely will do very well.  -She was seen by surgeon Dr. Barry Dienes today, likely will have lumpectomy and sentinel lymph node biopsy in the next few weeks. -We discussed the small risk of cancer recurrence after her surgery, and options of adjuvant therapy to reduce the risk -Giving the strong ER/PR positivity, and her postmenopausal status, I recommend aromatase inhibitor as adjuvant endocrine therapy to reduce the risk of cancer recurrence. Potential side effects was reviewed with her. She initially was a little reluctant, but became more open to the recommendation, and will think about it. -If the tumor is more than 1 cm on the final surgical path, I would recommend Oncotype DX test to further predict the risk of cancer recurrence, and need for adjuvant chemotherapy. She agrees with the plan -She was also seen by a radiation oncologist Dr. Corlis Hove worse today, adjuvant radiation was discussed and offered and she is interested.  2. Colitis, arthritis, depression -She will continue follow-up with her primary care physician and Dr. Cathlean Cower  Plan: -She will likely have lumpectomy and sentinel lymph node biopsy in the next few weeks -I'll review her surgical pathology results, and decide about Oncotype DX test. I'll see her 3-4 weeks after her surgery to review the test results, sooner if  her surgical path reveals more advance disease.    All questions were answered. The patient knows to  call the clinic with any problems, questions or concerns. I spent 55 minutes counseling the patient face to face. The total time spent in the appointment was 60 minutes and more than 50% was on counseling.     Truitt Merle, MD 12/06/2014 12:18 PM

## 2014-12-06 NOTE — Progress Notes (Signed)
Radiation Oncology         [phone]) [phone] ________________________________  Initial outpatient Consultation - Date: 12/06/2014   Name: Sara Barnes MRN: [id number]   DOB: 04/03/1941  REFERRING PHYSICIAN: [name], MD  DIAGNOSIS AND STAGE: T1cN0 Invasive Ductal Carcinoma of the Left Breast  HISTORY OF PRESENT ILLNESS::Sara Barnes is a 74 y.o. female with a palpable left breast mass. She does have a history of bilateral implants. Ultrasound showed a mass in the lower inner quadrant measuring 1.6 cm. A biopsy showed grade I-II invasive ductal carcinoma. ER positive at 100%, PR positive at 100%, Ki67 at 10%, and HER2 negative. She presents today regarding radiation for the management of her disease.  PREVIOUS RADIATION THERAPY: No  Past medical, social and family history were reviewed in the electronic chart. Review of symptoms was reviewed in the electronic chart. Medications were reviewed in the electronic chart.   Gynecologic History  Age at first menstrual period? 12  Are you still having periods? No  If you no longer have periods: Have you used hormone replacement? No Obstetric History:  How many children have you carried to term? 1 Your age at first live birth? 15  Pregnant now or trying to get pregnant? No  Have you used birth control pills or hormone shots for contraception? Yes  If so, for how long (or approximate dates)? 25 years Health Maintenance:  Have you ever had a colonoscopy? yes If yes, date? 2016  Have you ever had a bone density? yes If yes, date? 2015  Date of your last PAP smear? Hysterectomy Date of your FIRST mammogram? 25 years ago  PHYSICAL EXAM: Pleasant female. No distress. Palpable mass inferior and posterior to the left nipple. Mobile. Non tender. No palpable axillary adenopathy bilaterally. No palpable abnormalities of the right breast.   IMPRESSION: T1cN0 Invasive Ductal Carcinoma of the Left Breast  PLAN:  I spoke to the patient today regarding  her diagnosis and options for treatment. We discussed the equivalence in terms of survival and local failure between mastectomy and breast conservation. We discussed the role of radiation in decreasing local failures in patients who undergo lumpectomy. We discussed the low rates of failure in patients her age with these non aggressive breast cancers. We discussed the randomized trials in elderly women showing equivalent survival when omitting raidiaton and taking an AI instead. We discussed the randomized trials showing a slight improvement in local control with the combination.We discussed the differences between systemic and local treatment. She is unsure what she wants to do.  We discussed the process of simulation and the placement tattoos.  We discussed the possibility of asymptomatic lung damage. We discussed the low likelihood of secondary malignancies. We discussed the possible side effects including but not limited to skin redness, fatigue, permanent skin darkening, and breast swelling.   The patient met with Dr. Barry Dienes and Dr. Burr Medico today as well as a member of our patient family support team, a dietitian, our survivor navigator, and our physical therapist. She will also have an oncotype after surgery to determine her eligibiity for chemotherapy.  I spent 40 minutes  face to face with the patient and more than 50% of that time was spent in counseling and/or coordination of care.   This document serves as a record of services personally performed by Thea Silversmith, MD. It was created on her behalf by Darcus Austin, a trained medical scribe. The creation of this record is based on the scribe's personal observations  and the provider's statements to them. This document has been checked and approved by the attending provider.   ------------------------------------------------  Thea Silversmith, MD

## 2014-12-06 NOTE — Therapy (Addendum)
[place], [place], [address] Phone: [phone]   Fax:  [phone]  Physical Therapy Evaluation  Patient Details  Name: Sara Barnes MRN: [id number] Date of Birth: [birth year] Referring Provider:  [name], MD  Encounter Date: 12/06/2014      PT End of Session - 12/06/14 1325    Visit Number 1   Number of Visits 1   PT Start Time 1028   PT Stop Time 1051   PT Time Calculation (min) 23 min   Activity Tolerance Patient tolerated treatment well   Behavior During Therapy [facility] for tasks assessed/performed      Past Medical History  Diagnosis Date  . Depression   . Hiatal hernia   . GERD (gastroesophageal reflux disease)   . Vitamin B12 deficiency   . Ulcerative colitis   . Benign neoplasm     of skin of trunk  . Pulmonary nodule, right 03/2011    Rt middle lobe 3 mm  . Colitis   . Cancer of lower-inner quadrant of female breast 11/30/2014  . Arthritis   . Hot flashes     Past Surgical History  Procedure Laterality Date  . Abdominal hysterectomy    . Appendectomy    . Breast surgery      Lumpectomy-Left breast  . Breast enhancement surgery    . Incision and drainage perirectal abscess N/A 09/28/2013    Procedure: IRRIGATION AND DEBRIDEMENT PERIANAL ABSCESS, proctoscopy;  Surgeon: Shann Medal, MD;  Location: [facility];  Service: General;  Laterality: N/A;    There were no vitals filed for this visit.  Visit Diagnosis:  Cancer of lower-inner quadrant of left female breast - Plan: PT plan of care cert/re-cert      Subjective Assessment - 12/06/14 1312    Subjective Patient was seen today for a baseline assessment of her newly diagnosed left breast cancer.   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  It is ER/PR positive, HER2 negative, measures 1.6 cm and has a Ki67 of 10%.   Patient Stated  Goals Reduce lymphedema risk and learn post op ROM HEP   Currently in Pain? No/denies            [facility] PT Assessment - 12/06/14 0001    Assessment   Medical Diagnosis Left breast cancer   Onset Date/Surgical Date 11/29/14   Hand Dominance Right   Prior Therapy none   Precautions   Precautions Other (comment)  Active breast cancer   Precaution Comments Hard of hearing   Restrictions   Weight Bearing Restrictions No   Balance Screen   Has the patient fallen in the past 6 months No   Has the patient had a decrease in activity level because of a fear of falling?  No   Is the patient reluctant to leave their home because of a fear of falling?  No   Home Environment   Living Environment Private residence   Living Arrangements Alone   Available Help at Discharge Family   Prior Function   Level of [place] Retired   Leisure She does not exercise   Cognition   Overall Cognitive Status Within Functional Limits for tasks assessed   Posture/Postural Control   Posture/Postural Control Postural limitations   Postural Limitations Rounded Shoulders;Forward head;Increased thoracic kyphosis   ROM / Strength   AROM / PROM /  Strength AROM;Strength   AROM   AROM Assessment Site Shoulder   Right/Left Shoulder Right;Left   Right Shoulder Extension 48 Degrees   Right Shoulder Flexion 131 Degrees   Right Shoulder ABduction 145 Degrees   Right Shoulder Internal Rotation 71 Degrees   Right Shoulder External Rotation 75 Degrees   Left Shoulder Extension 54 Degrees   Left Shoulder Flexion 137 Degrees   Left Shoulder ABduction 135 Degrees   Left Shoulder Internal Rotation 77 Degrees   Left Shoulder External Rotation 78 Degrees   Strength   Overall Strength Within functional limits for tasks performed           LYMPHEDEMA/ONCOLOGY QUESTIONNAIRE - 12/06/14 1321    Type   Cancer Type Left breast cancer   Lymphedema Assessments   Lymphedema Assessments Upper  extremities   Right Upper Extremity Lymphedema   10 cm Proximal to Olecranon Process 26.2 cm   Olecranon Process 24.5 cm   10 cm Proximal to Ulnar Styloid Process 20.8 cm   Just Proximal to Ulnar Styloid Process 15.9 cm   Across Hand at [employer] 18.8 cm   At [place] of 2nd Digit 6.5 cm   Left Upper Extremity Lymphedema   10 cm Proximal to Olecranon Process 25.7 cm   Olecranon Process 24.4 cm   10 cm Proximal to Ulnar Styloid Process 19.8 cm   Just Proximal to Ulnar Styloid Process 15.9 cm   Across Hand at [employer] 18.8 cm   At [place] of 2nd Digit 6.5 cm      Patient was instructed today in a home exercise program today for post op shoulder range of motion. These included active assist shoulder flexion in sitting, scapular retraction, wall walking with shoulder abduction, and hands behind head external rotation.  She was encouraged to do these twice a day, holding 3 seconds and repeating 5 times when permitted by her physician.          PT Education - 12/06/14 1324    Education provided Yes   Education Details Post op shoulder ROM HEP and lymphedema risk reduction   Person(s) Educated Patient   Methods Explanation;Demonstration;Handout   Comprehension Returned demonstration;Verbalized understanding              Breast Clinic Goals - 12/06/14 1331    Patient will be able to verbalize understanding of pertinent lymphedema risk reduction practices relevant to her diagnosis specifically related to skin care.   Time 1   Period Days   Status Achieved   Patient will be able to return demonstrate and/or verbalize understanding of the post-op home exercise program related to regaining shoulder range of motion.   Time 1   Period Days   Status Achieved   Patient will be able to verbalize understanding of the importance of attending the postoperative After Breast Cancer Class for further lymphedema risk reduction education and therapeutic exercise.   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  It is ER/PR positive, HER2 negative, measures 1.6 cm and has a Ki67 of 10%.  She is planning to have a left lumpectomy with a sentinel node biopsy followed by Oncotype testing, radiation and anti-estrogen therapy.  She may benefit from post op PT to  regain shoulder ROM and strength and reduce lymphedema risk.   Pt will benefit from skilled therapeutic intervention in order to improve on the following deficits Decreased range of motion;Impaired UE functional use;Pain;Decreased knowledge of precautions;Decreased strength   Rehab Potential Excellent   Clinical Impairments Affecting Rehab Potential none   PT Frequency One time visit   PT Treatment/Interventions Therapeutic exercise;Patient/family education   Consulted and Agree with Plan of Care Patient     Patient will follow up at outpatient cancer rehab if needed following surgery.  If the patient requires physical therapy at that time, a specific plan will be dictated and sent to the referring physician for approval. The patient was educated today on appropriate basic range of motion exercises to begin post operatively and the importance of attending the After Breast Cancer class following surgery.  Patient was educated today on lymphedema risk reduction practices as it pertains to recommendations that will benefit the patient immediately following surgery.  She verbalized good understanding.  No additional physical therapy is indicated at this time.       Problem List Patient Active Problem List   Diagnosis Date Noted  . Cancer of lower-inner quadrant of left female breast 11/30/2014  . Perirectal abscess 09/29/2013  . Ulcerative colitis, unspecified 09/27/2013  . Rectal pain 09/27/2013  . Thoracic back pain 03/01/2013  . Snoring  04/19/2012  . Pulmonary nodule 06/24/2011  . HYPERLIPIDEMIA 03/14/2010  . ARM PAIN, LEFT 03/14/2010  . OSTEOPENIA 01/12/2010  . VITAMIN D DEFICIENCY 12/13/2009  . POSTMENOPAUSAL STATUS 12/13/2009  . SINUSITIS, CHRONIC 12/11/2008  . CHEST PAIN UNSPECIFIED 10/06/2008  . DEPRESSION/ANXIETY 01/20/2008  . IRRITABLE BOWEL SYNDROME 01/20/2008  . BREAST MASS, RIGHT 03/08/2007  . VITAMIN B12 DEFICIENCY 08/12/2006  . HIATAL HERNIA 08/12/2006  . BREAST IMPLANTS, BILATERAL, HX OF 08/12/2006    Annia Friendly, PT 12/06/2014 1:58 PM   [place], [place], [address] Phone: [phone]   Fax:  [phone]   A late entry of a G-code was entered which is determined by Clinical Judgement and is the self-care G-code and all 3 (current, goal, discharge status) are CI (limited 1-20%). Annia Friendly, [place] 01/03/2015 5:17 PM

## 2014-12-06 NOTE — Patient Instructions (Signed)

## 2014-12-06 NOTE — Progress Notes (Signed)
Sara Barnes is a very pleasant 74 y.o. female from [place], [place] with newly diagnosed grade 1-2 carcinoma of the left breast.  Biopsy results revealed the tumor's prognostic profile is ER positive, PR positive, and HER2/neu negative. Ki67 is 10%.  She presents today with her sister to the [facility]) for treatment consideration and recommendations from the breast surgeon, radiation oncologist, and medical oncologist.     I briefly met with Sara Barnes and her sister during her [facility] visit today. We discussed the purpose of the Survivorship Clinic, which will include monitoring for recurrence, coordinating completion of age and gender-appropriate cancer screenings, promotion of overall wellness, as well as managing potential late/long-term side effects of anti-cancer treatments.    The treatment plan for Sara Barnes will likely include surgery, +/- radiation therapy, and +/- anti-estrogen therapy.  As of today, the intent of treatment for Sara Barnes is cure, therefore she will be eligible for the Survivorship Clinic upon her completion of treatment.  Her survivorship care plan (SCP) document will be drafted and updated throughout the course of her treatment trajectory. She will receive the SCP in an office visit with myself in the Survivorship Clinic once she has completed treatment.   Sara Barnes was encouraged to ask questions and all questions were answered to her satisfaction.  She was given my business card and encouraged to contact me with any concerns regarding survivorship.  I look forward to participating in her care.   Mike Craze, NP [place] [phone]

## 2014-12-06 NOTE — Progress Notes (Signed)
Clinical Social Work [place] Psychosocial Distress Screening [place]  Patient completed distress screening protocol and scored a 6 on the Psychosocial Distress Thermometer which indicates moderate distress. Clinical Social Worker met with patient and patients sister in [facility] to assess for distress and other psychosocial needs. Patient stated she was comfortable with her treatment team and treatment plan. CSW and patient discussed common feeling and emotions when being diagnosed with cancer, and the importance of support during treatment. CSW informed patient of the support team and support services at [facility]. CSW provided contact information and encouraged patient to call with any questions or concerns.   ONCBCN DISTRESS SCREENING 12/06/2014  Screening Type Initial Screening  Distress experienced in past week (1-10) 6  Emotional problem type Nervousness/Anxiety  Information Concerns Type Lack of info about treatment  Physical Problem type Sleep/insomnia;Skin dry/itchy  Physician notified of physical symptoms Yes  Referral to clinical psychology No  Referral to clinical social work Yes  Referral to dietition No  Referral to financial advocate No  Referral to support programs No  Referral to palliative care No   Johnnye Lana, MSW, LCSW, OSW-C Clinical Social Worker [place] [phone]

## 2014-12-07 ENCOUNTER — Encounter (HOSPITAL_BASED_OUTPATIENT_CLINIC_OR_DEPARTMENT_OTHER): Payer: Self-pay | Admitting: *Deleted

## 2014-12-07 DIAGNOSIS — R0989 Other specified symptoms and signs involving the circulatory and respiratory systems: Secondary | ICD-10-CM

## 2014-12-07 HISTORY — DX: Other specified symptoms and signs involving the circulatory and respiratory systems: R09.89

## 2014-12-11 ENCOUNTER — Ambulatory Visit
Admission: RE | Admit: 2014-12-11 | Discharge: 2014-12-11 | Disposition: A | Discharge status: Home / Self Care | Payer: Commercial Managed Care - HMO | Source: Ambulatory Visit | Attending: General Surgery | Admitting: General Surgery

## 2014-12-11 DIAGNOSIS — C50312 Malignant neoplasm of lower-inner quadrant of left female breast: Secondary | ICD-10-CM

## 2014-12-11 DIAGNOSIS — C50912 Malignant neoplasm of unspecified site of left female breast: Secondary | ICD-10-CM | POA: Diagnosis not present

## 2014-12-13 ENCOUNTER — Ambulatory Visit
Admission: RE | Admit: 2014-12-13 | Discharge: 2014-12-13 | Disposition: A | Discharge status: Home / Self Care | Payer: Medicare HMO | Source: Ambulatory Visit | Attending: General Surgery | Admitting: General Surgery

## 2014-12-13 ENCOUNTER — Ambulatory Visit (HOSPITAL_COMMUNITY): Payer: Commercial Managed Care - HMO

## 2014-12-13 ENCOUNTER — Ambulatory Visit (HOSPITAL_BASED_OUTPATIENT_CLINIC_OR_DEPARTMENT_OTHER)
Admission: RE | Admit: 2014-12-13 | Discharge: 2014-12-13 | Disposition: A | Discharge status: Home / Self Care | Payer: Commercial Managed Care - HMO | Source: Ambulatory Visit | Attending: General Surgery | Admitting: General Surgery

## 2014-12-13 ENCOUNTER — Encounter (HOSPITAL_BASED_OUTPATIENT_CLINIC_OR_DEPARTMENT_OTHER): Payer: Self-pay | Admitting: *Deleted

## 2014-12-13 ENCOUNTER — Other Ambulatory Visit: Payer: Self-pay | Admitting: *Deleted

## 2014-12-13 ENCOUNTER — Ambulatory Visit (HOSPITAL_COMMUNITY)
Admission: RE | Admit: 2014-12-13 | Discharge: 2014-12-13 | Disposition: A | Discharge status: Home / Self Care | Payer: Commercial Managed Care - HMO | Source: Ambulatory Visit | Attending: General Surgery | Admitting: General Surgery

## 2014-12-13 ENCOUNTER — Ambulatory Visit (HOSPITAL_BASED_OUTPATIENT_CLINIC_OR_DEPARTMENT_OTHER): Payer: Commercial Managed Care - HMO | Admitting: Anesthesiology

## 2014-12-13 ENCOUNTER — Encounter (HOSPITAL_BASED_OUTPATIENT_CLINIC_OR_DEPARTMENT_OTHER)
Admission: RE | Disposition: A | Discharge status: Home / Self Care | Payer: Self-pay | Source: Ambulatory Visit | Attending: General Surgery

## 2014-12-13 ENCOUNTER — Telehealth: Payer: Self-pay | Admitting: Hematology

## 2014-12-13 DIAGNOSIS — E780 Pure hypercholesterolemia: Secondary | ICD-10-CM | POA: Diagnosis not present

## 2014-12-13 DIAGNOSIS — K5190 Ulcerative colitis, unspecified, without complications: Secondary | ICD-10-CM | POA: Insufficient documentation

## 2014-12-13 DIAGNOSIS — E559 Vitamin D deficiency, unspecified: Secondary | ICD-10-CM | POA: Insufficient documentation

## 2014-12-13 DIAGNOSIS — C50912 Malignant neoplasm of unspecified site of left female breast: Secondary | ICD-10-CM | POA: Diagnosis not present

## 2014-12-13 DIAGNOSIS — Z882 Allergy status to sulfonamides status: Secondary | ICD-10-CM | POA: Diagnosis not present

## 2014-12-13 DIAGNOSIS — Z888 Allergy status to other drugs, medicaments and biological substances status: Secondary | ICD-10-CM | POA: Insufficient documentation

## 2014-12-13 DIAGNOSIS — C50312 Malignant neoplasm of lower-inner quadrant of left female breast: Secondary | ICD-10-CM

## 2014-12-13 DIAGNOSIS — Z885 Allergy status to narcotic agent status: Secondary | ICD-10-CM | POA: Diagnosis not present

## 2014-12-13 DIAGNOSIS — F329 Major depressive disorder, single episode, unspecified: Secondary | ICD-10-CM | POA: Insufficient documentation

## 2014-12-13 DIAGNOSIS — Z79899 Other long term (current) drug therapy: Secondary | ICD-10-CM | POA: Diagnosis not present

## 2014-12-13 DIAGNOSIS — Z170 Estrogen receptor positive status [ER+]: Secondary | ICD-10-CM | POA: Diagnosis not present

## 2014-12-13 DIAGNOSIS — K219 Gastro-esophageal reflux disease without esophagitis: Secondary | ICD-10-CM | POA: Insufficient documentation

## 2014-12-13 HISTORY — DX: Other specified postprocedural states: Z98.890

## 2014-12-13 HISTORY — DX: Other specified postprocedural states: R11.2

## 2014-12-13 HISTORY — DX: Family history of other specified conditions: Z84.89

## 2014-12-13 HISTORY — DX: Other seasonal allergic rhinitis: J30.2

## 2014-12-13 HISTORY — DX: Other complications of anesthesia, initial encounter: T88.59XA

## 2014-12-13 HISTORY — DX: Functional dyspepsia: K30

## 2014-12-13 HISTORY — DX: Other specified symptoms and signs involving the circulatory and respiratory systems: R09.89

## 2014-12-13 HISTORY — DX: Adverse effect of unspecified anesthetic, initial encounter: T41.45XA

## 2014-12-13 HISTORY — DX: Dental restoration status: Z98.811

## 2014-12-13 HISTORY — PX: BREAST LUMPECTOMY WITH RADIOACTIVE SEED AND SENTINEL LYMPH NODE BIOPSY: SHX6550

## 2014-12-13 SURGERY — BREAST LUMPECTOMY WITH RADIOACTIVE SEED AND SENTINEL LYMPH NODE BIOPSY
Anesthesia: General | Site: Breast | Laterality: Left

## 2014-12-13 MED ORDER — MIDAZOLAM HCL 2 MG/2ML IJ SOLN
INTRAMUSCULAR | Status: AC
  Filled 2014-12-13: qty 2

## 2014-12-13 MED ORDER — SCOPOLAMINE 1 MG/3DAYS TD PT72
MEDICATED_PATCH | TRANSDERMAL | Status: AC
  Filled 2014-12-13: qty 1

## 2014-12-13 MED ORDER — MIDAZOLAM HCL 2 MG/2ML IJ SOLN
INTRAMUSCULAR | Status: AC
Start: 2014-12-13 — End: 2014-12-13
  Filled 2014-12-13: qty 2

## 2014-12-13 MED ORDER — PROMETHAZINE HCL 25 MG/ML IJ SOLN: 6.2500 mg | INTRAMUSCULAR | Status: DC | PRN

## 2014-12-13 MED ORDER — ONDANSETRON HCL 4 MG/2ML IJ SOLN
INTRAMUSCULAR | Status: DC | PRN
  Administered 2014-12-13: 4 mg via INTRAVENOUS

## 2014-12-13 MED ORDER — CEFAZOLIN SODIUM-DEXTROSE 2-3 GM-% IV SOLR
INTRAVENOUS | Status: AC
  Filled 2014-12-13: qty 50

## 2014-12-13 MED ORDER — LIDOCAINE-EPINEPHRINE (PF) 1 %-1:200000 IJ SOLN
INTRAMUSCULAR | Status: DC | PRN
  Administered 2014-12-13: 20 mL

## 2014-12-13 MED ORDER — FENTANYL CITRATE (PF) 100 MCG/2ML IJ SOLN
50.0000 ug | INTRAMUSCULAR | Status: AC | PRN
  Administered 2014-12-13: 100 ug via INTRAVENOUS
  Administered 2014-12-13: 50 ug via INTRAVENOUS
  Administered 2014-12-13: 25 ug via INTRAVENOUS
  Administered 2014-12-13: 100 ug via INTRAVENOUS
  Administered 2014-12-13: 25 ug via INTRAVENOUS

## 2014-12-13 MED ORDER — MIDAZOLAM HCL 2 MG/2ML IJ SOLN
1.0000 mg | INTRAMUSCULAR | Status: DC | PRN
  Administered 2014-12-13 (×2): 1 mg via INTRAVENOUS

## 2014-12-13 MED ORDER — CEFAZOLIN SODIUM-DEXTROSE 2-3 GM-% IV SOLR
2.0000 g | INTRAVENOUS | Status: AC
  Administered 2014-12-13: 2 g via INTRAVENOUS

## 2014-12-13 MED ORDER — OXYCODONE-ACETAMINOPHEN 5-325 MG PO TABS
ORAL_TABLET | ORAL | Status: AC
  Filled 2014-12-13: qty 1

## 2014-12-13 MED ORDER — SCOPOLAMINE 1 MG/3DAYS TD PT72
1.0000 | MEDICATED_PATCH | Freq: Once | TRANSDERMAL | Status: DC | PRN
  Administered 2014-12-13: 1.5 mg via TRANSDERMAL

## 2014-12-13 MED ORDER — LACTATED RINGERS IV SOLN: INTRAVENOUS | Status: DC

## 2014-12-13 MED ORDER — HYDROMORPHONE HCL 1 MG/ML IJ SOLN
0.2500 mg | INTRAMUSCULAR | Status: DC | PRN
  Administered 2014-12-13 (×2): 0.25 mg via INTRAVENOUS

## 2014-12-13 MED ORDER — FENTANYL CITRATE (PF) 100 MCG/2ML IJ SOLN
INTRAMUSCULAR | Status: AC
  Filled 2014-12-13: qty 4

## 2014-12-13 MED ORDER — PROPOFOL 10 MG/ML IV BOLUS
INTRAVENOUS | Status: DC | PRN
  Administered 2014-12-13: 150 mg via INTRAVENOUS

## 2014-12-13 MED ORDER — FENTANYL CITRATE (PF) 100 MCG/2ML IJ SOLN
INTRAMUSCULAR | Status: AC
  Filled 2014-12-13: qty 2

## 2014-12-13 MED ORDER — OXYCODONE-ACETAMINOPHEN 5-325 MG PO TABS
1.0000 | ORAL_TABLET | ORAL | Status: DC | PRN
  Administered 2014-12-13: 0.5 via ORAL

## 2014-12-13 MED ORDER — MEPERIDINE HCL 25 MG/ML IJ SOLN: 6.2500 mg | INTRAMUSCULAR | Status: DC | PRN

## 2014-12-13 MED ORDER — DEXAMETHASONE SODIUM PHOSPHATE 4 MG/ML IJ SOLN
INTRAMUSCULAR | Status: DC | PRN
  Administered 2014-12-13: 10 mg via INTRAVENOUS

## 2014-12-13 MED ORDER — BUPIVACAINE HCL (PF) 0.25 % IJ SOLN
INTRAMUSCULAR | Status: AC
  Filled 2014-12-13: qty 30

## 2014-12-13 MED ORDER — OXYCODONE-ACETAMINOPHEN 5-325 MG PO TABS: 1.0000 | ORAL_TABLET | ORAL | Status: DC | PRN

## 2014-12-13 MED ORDER — GLYCOPYRROLATE 0.2 MG/ML IJ SOLN: 0.2000 mg | Freq: Once | INTRAMUSCULAR | Status: DC | PRN

## 2014-12-13 MED ORDER — LACTATED RINGERS IV SOLN
INTRAVENOUS | Status: DC
  Administered 2014-12-13: 09:00:00 via INTRAVENOUS

## 2014-12-13 MED ORDER — LIDOCAINE HCL (CARDIAC) 20 MG/ML IV SOLN
INTRAVENOUS | Status: DC | PRN
  Administered 2014-12-13: 80 mg via INTRAVENOUS

## 2014-12-13 MED ORDER — HYDROMORPHONE HCL 1 MG/ML IJ SOLN
INTRAMUSCULAR | Status: AC
  Filled 2014-12-13: qty 1

## 2014-12-13 MED ORDER — TECHNETIUM TC 99M SULFUR COLLOID FILTERED: 1.0000 | Freq: Once | INTRAVENOUS | Status: DC | PRN

## 2014-12-13 MED ORDER — SODIUM CHLORIDE 0.9 % IR SOLN
Status: DC | PRN
  Administered 2014-12-13: 500 mL

## 2014-12-13 SURGICAL SUPPLY — 53 items
ADH SKN CLS APL DERMABOND .7 (GAUZE/BANDAGES/DRESSINGS) ×1
BINDER BREAST LRG (GAUZE/BANDAGES/DRESSINGS) ×3 IMPLANT
BINDER BREAST XLRG (GAUZE/BANDAGES/DRESSINGS) IMPLANT
BINDER BREAST XXLRG (GAUZE/BANDAGES/DRESSINGS) IMPLANT
BLADE HEX COATED 2.75 (ELECTRODE) ×3 IMPLANT
BLADE SURG 10 STRL SS (BLADE) ×3 IMPLANT
BLADE SURG 15 STRL LF DISP TIS (BLADE) ×1 IMPLANT
BLADE SURG 15 STRL SS (BLADE) ×2
BNDG COHESIVE 4X5 TAN STRL (GAUZE/BANDAGES/DRESSINGS) ×3 IMPLANT
CANISTER SUC SOCK COL 7IN (MISCELLANEOUS) IMPLANT
CANISTER SUCT 1200ML W/VALVE (MISCELLANEOUS) ×3 IMPLANT
CHLORAPREP W/TINT 26ML (MISCELLANEOUS) ×3 IMPLANT
CLIP TI LARGE 6 (CLIP) ×3 IMPLANT
CLIP TI MEDIUM 6 (CLIP) ×6 IMPLANT
CLOSURE WOUND 1/2 X4 (GAUZE/BANDAGES/DRESSINGS) ×1
COVER MAYO STAND STRL (DRAPES) ×3 IMPLANT
COVER PROBE W GEL 5X96 (DRAPES) ×3 IMPLANT
DERMABOND ADVANCED (GAUZE/BANDAGES/DRESSINGS) ×2
DERMABOND ADVANCED .7 DNX12 (GAUZE/BANDAGES/DRESSINGS) ×1 IMPLANT
DEVICE DUBIN W/COMP PLATE 8390 (MISCELLANEOUS) ×3 IMPLANT
DRAPE UTILITY XL STRL (DRAPES) ×3 IMPLANT
DRSG PAD ABDOMINAL 8X10 ST (GAUZE/BANDAGES/DRESSINGS) ×3 IMPLANT
ELECT REM PT RETURN 9FT ADLT (ELECTROSURGICAL) ×3
ELECTRODE REM PT RTRN 9FT ADLT (ELECTROSURGICAL) ×1 IMPLANT
GLOVE BIO SURGEON STRL SZ 6 (GLOVE) ×3 IMPLANT
GLOVE BIOGEL PI IND STRL 6.5 (GLOVE) ×3 IMPLANT
GLOVE BIOGEL PI INDICATOR 6.5 (GLOVE) ×6
GLOVE ECLIPSE 6.5 STRL STRAW (GLOVE) ×3 IMPLANT
GOWN STRL REUS W/ TWL LRG LVL3 (GOWN DISPOSABLE) ×1 IMPLANT
GOWN STRL REUS W/TWL 2XL LVL3 (GOWN DISPOSABLE) ×3 IMPLANT
GOWN STRL REUS W/TWL LRG LVL3 (GOWN DISPOSABLE) ×3
KIT MARKER MARGIN INK (KITS) ×3 IMPLANT
NEEDLE HYPO 25X1 1.5 SAFETY (NEEDLE) ×3 IMPLANT
NS IRRIG 1000ML POUR BTL (IV SOLUTION) ×3 IMPLANT
PACK BASIN DAY SURGERY FS (CUSTOM PROCEDURE TRAY) ×3 IMPLANT
PACK UNIVERSAL I (CUSTOM PROCEDURE TRAY) ×3 IMPLANT
PENCIL BUTTON HOLSTER BLD 10FT (ELECTRODE) ×3 IMPLANT
SLEEVE SCD COMPRESS KNEE MED (MISCELLANEOUS) ×3 IMPLANT
SPONGE GAUZE 4X4 12PLY STER LF (GAUZE/BANDAGES/DRESSINGS) ×3 IMPLANT
SPONGE LAP 18X18 X RAY DECT (DISPOSABLE) ×3 IMPLANT
STOCKINETTE IMPERVIOUS LG (DRAPES) ×3 IMPLANT
STRIP CLOSURE SKIN 1/2X4 (GAUZE/BANDAGES/DRESSINGS) ×2 IMPLANT
SUT MNCRL AB 4-0 PS2 18 (SUTURE) ×3 IMPLANT
SUT VIC AB 2-0 SH 27 (SUTURE) ×3
SUT VIC AB 2-0 SH 27XBRD (SUTURE) ×1 IMPLANT
SUT VIC AB 3-0 SH 27 (SUTURE) ×6
SUT VIC AB 3-0 SH 27X BRD (SUTURE) ×2 IMPLANT
SYR CONTROL 10ML LL (SYRINGE) ×3 IMPLANT
TOWEL OR 17X24 6PK STRL BLUE (TOWEL DISPOSABLE) ×3 IMPLANT
TOWEL OR NON WOVEN STRL DISP B (DISPOSABLE) ×3 IMPLANT
TUBE CONNECTING 20'X1/4 (TUBING) ×1
TUBE CONNECTING 20X1/4 (TUBING) ×2 IMPLANT
YANKAUER SUCT BULB TIP NO VENT (SUCTIONS) ×3 IMPLANT

## 2014-12-13 NOTE — Anesthesia Procedure Notes (Signed)
Procedure Name: LMA Insertion Date/Time: 12/13/2014 10:47 AM Performed by: Maryella Shivers Pre-anesthesia Checklist: Patient identified, Emergency Drugs available, Suction available and Patient being monitored Patient Re-evaluated:Patient Re-evaluated prior to inductionOxygen Delivery Method: Circle System Utilized Preoxygenation: Pre-oxygenation with 100% oxygen Intubation Type: IV induction Ventilation: Mask ventilation without difficulty LMA: LMA inserted LMA Size: 4.0 Number of attempts: 1 Airway Equipment and Method: Bite block Placement Confirmation: positive ETCO2 Tube secured with: Tape Dental Injury: Teeth and Oropharynx as per pre-operative assessment

## 2014-12-13 NOTE — Interval H&P Note (Signed)
History and Physical Interval Note:  12/13/2014 10:11 AM  Sara Barnes  has presented today for surgery, with the diagnosis of LEFT BREAST CANCER  The various methods of treatment have been discussed with the patient and family. After consideration of risks, benefits and other options for treatment, the patient has consented to  Procedure(s): LEFT BREAST LUMPECTOMY WITH RADIOACTIVE SEED AND SENTINEL LYMPH NODE BIOPSY (Left) as a surgical intervention .  The patient's history has been reviewed, patient examined, no change in status, stable for surgery.  I have reviewed the patient's chart and labs.  Questions were answered to the patient's satisfaction.     Herbert Aguinaldo

## 2014-12-13 NOTE — Telephone Encounter (Signed)
Could not reach patient by phone for follow up,a calendar was mailed and she has mychart as well  Sara Barnes

## 2014-12-13 NOTE — Anesthesia Postprocedure Evaluation (Signed)
  Anesthesia Post-op Note  Patient: Sara Barnes  Procedure(s) Performed: Procedure(s): LEFT BREAST LUMPECTOMY WITH RADIOACTIVE SEED AND SENTINEL LYMPH NODE BIOPSY (Left)  Patient Location: PACU  Anesthesia Type:General  Level of Consciousness: awake, alert  and oriented  Airway and Oxygen Therapy: Patient Spontanous Breathing  Post-op Pain: minimal  Post-op Assessment: Post-op Vital signs reviewed and Patient's Cardiovascular Status Stable              Post-op Vital Signs: Reviewed and stable  Last Vitals:  Filed Vitals:   12/13/14 1230  BP: 138/79  Pulse: 67  Temp:   Resp: 15    Complications: No apparent anesthesia complications

## 2014-12-13 NOTE — Transfer of Care (Signed)
Immediate Anesthesia Transfer of Care Note  Patient: Sara Barnes  Procedure(s) Performed: Procedure(s): LEFT BREAST LUMPECTOMY WITH RADIOACTIVE SEED AND SENTINEL LYMPH NODE BIOPSY (Left)  Patient Location: PACU  Anesthesia Type:GA combined with regional for post-op pain  Level of Consciousness: sedated  Airway & Oxygen Therapy: Patient Spontanous Breathing and Patient connected to face mask oxygen  Post-op Assessment: Report given to RN and Post -op Vital signs reviewed and stable  Post vital signs: Reviewed and stable  Last Vitals:  Filed Vitals:   12/13/14 1200  BP:   Pulse: 64  Temp:   Resp:     Complications: No apparent anesthesia complications

## 2014-12-13 NOTE — Op Note (Signed)
Left Breast Radioactive seed localized lumpectomy and sentinel lymph node biopsy  Indications: This patient presents with history of left breast cancer, lower inner quadrant, [id number]  Pre-operative Diagnosis: Left breast cancer  Post-operative Diagnosis: Same  Surgeon: [name]   Anesthesia: General endotracheal anesthesia  ASA Class: 2  Procedure Details  The patient was seen in the Holding Room. The risks, benefits, complications, treatment options, and expected outcomes were discussed with the patient. The possibilities of bleeding, infection, the need for additional procedures, failure to diagnose a condition, and creating a complication requiring transfusion or operation were discussed with the patient. The patient concurred with the proposed plan, giving informed consent.  The site of surgery properly noted/marked. The patient was taken to Operating Room # 8, identified, and the procedure verified as Left Breast Seed Localized Lumpectomy with sentinel lymph node biopsy. A Time Out was held and the above information confirmed.  The left arm, breast, and chest were prepped and draped in standard fashion. The lumpectomy was performed by creating a circumareolar incision over the inferior breast over the previously placed radioactive seed.  Dissection was carried down to around the point of maximum signal intensity. The cautery was used to perform the dissection.  Hemostasis was achieved with cautery. The edges of the cavity were marked with large clips, with one each medial, lateral, inferior and superior, and one clips posteriorly.   The specimen was inked with the margin marker paint kit.    Specimen radiography confirmed inclusion of the mammographic lesion, the clip, and the seed.  The background signal in the breast was zero.  The posterior margin is implant capsule, the anterior margin is skin.  The implant was visualized for around 1.5 cm.  The capsule was reclosed with running 2-0  vicryl, avoiding the implant.  The wound was irrigated and closed with 3-0 vicryl in layers and 4-0 monocryl subcuticular suture.    Using a hand-held gamma probe, left axillary sentinel nodes were identified transcutaneously.  An oblique incision was created below the axillary hairline.  Dissection was carried through the clavipectoral fascia.  Two level 1 axillary sentinel nodes were removed, and two level 2 lymph nodes were removed.  Counts per second were 690, 720, 250, and 80.    The background count was 15 cps.  The wound was irrigated. The lateral aspect of the implant was seen.  The capsule was closed over with 2-0 vicryl running suture, taking great care to avoid the surface of the implant.   Hemostasis was achieved with cautery.  The axillary incision was closed with a 3-0 vicryl deep dermal interrupted sutures and a 4-0 monocryl subcuticular closure.    Sterile dressings were applied. At the end of the operation, all sponge, instrument, and needle counts were correct.  Findings: grossly clear surgical margins and no adenopathy  Estimated Blood Loss:  min         Specimens: left breast lumpectomy and four axillary sentinel lymph nodes.             Complications:  None; patient tolerated the procedure well.         Disposition: PACU - hemodynamically stable.         Condition: stable

## 2014-12-13 NOTE — H&P (Signed)
Sara Barnes 12/06/2014 7:06 AM Location: [facility] Patient #: [id number] DOB: 1941-04-03 Widowed / Language: Sara Barnes / Race: White Female  History of Present Illness Sara Klein MD; 12/06/2014 11:20 AM) The patient is a 74 year old female who presents with breast cancer. Patient is a 74 yo F who presented with a palpable left breast mass. She is referred by Dr. Etter Barnes. She has a history of a benign excisional biopsy on the left and a history of retropectoral silicone breast implants placed in 1990. She underwent diagnostic mammogram and ultrasound and was found to have a 1.6 cm mass at 6:30. 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 She has no personal or family history of cancer. She did not use HRT. She had one child at age 80. She did use hormonal contraception for around 2 years. She had had a colonoscopy and a bone density study. She is s/p hysterectomy.  She is relatively active. She does all her own yardwork.  pathology Diagnosis Breast, left, needle core biopsy, subareolar about 6:30 - INVASIVE DUCTAL CARCINOMA, SEE COMMENT. - POSITIVE FOR LYMPH VASCULAR INVASION. DX mammo IMPRESSION: 1. Highly suspicious 1.6 cm irregular shadowing mass at the site of palpable concern in the subareolar lower inner quadrant of the left breast at approximately 630. 2. No suspicious left axillary lymph nodes are identified CMET and CBC essentially normal 12/06/2014.   Other Problems Sara Klein, MD; 12/06/2014 7:11 AM) Hemorrhoids Hypercholesterolemia Ulcerative Colitis  Past Surgical History Sara Klein, MD; 12/06/2014 7:11 AM) Anal Fissure Repair Breast Biopsy Left. Hysterectomy (not due to cancer) - Partial  Diagnostic Studies History Sara Klein, MD; 12/06/2014 7:11 AM) Mammogram never  Social History Sara Klein, MD; 12/06/2014 7:11 AM) Alcohol use Moderate alcohol use. Caffeine use  Coffee, Tea. No drug use Tobacco use Never smoker.  Family History Sara Klein, MD; 12/06/2014 7:11 AM) Arthritis Sara Barnes, Sara Barnes. Colon Polyps Sara Barnes. Diabetes Mellitus Sara Barnes. Thyroid problems Sara Barnes.  Pregnancy / Birth History Sara Klein, MD; 12/06/2014 7:11 AM) Age at menarche 66 years. Age of menopause 57-55 Gravida 1 Maternal age 57-25 Para 1 Regular periods  Review of Systems Sara Klein MD; 12/06/2014 11:18 AM)  Note: positive for chils/night sweats, fatigue, hearing loos, sinus problems, heartburn, arthritis. otherwise negative.    Vitals Sara Klein MD; 12/06/2014 11:21 AM) 12/06/2014 11:20 AM Weight: 157.7 lb Height: 66in Body Surface Area: 1.83 m Body Mass Index: 25.45 kg/m Temp.: 97.41F  Pulse: 74 (Regular)  Resp.: 18 (Unlabored)  BP: 102/61 (Sitting, Left Arm, Standard)    Physical Exam Sara Klein MD; 12/06/2014 11:20 AM) General Mental Status-Alert. General Appearance-Consistent with stated age. Hydration-Well hydrated. Voice-Normal.  Head and Neck Head-normocephalic, atraumatic with no lesions or palpable masses. Trachea-midline. Thyroid Gland Characteristics - normal size and consistency.  Eye Eyeball - Bilateral-Extraocular movements intact. Sclera/Conjunctiva - Bilateral-No scleral icterus.  Chest and Lung Exam Chest and lung exam reveals -quiet, even and easy respiratory effort with no use of accessory muscles and on auscultation, normal breath sounds, no adventitious sounds and normal vocal resonance. Inspection Chest Wall - Normal. Back - normal.  Breast Note: palpable mass at 6 oclock, 1.5 cm. This is at the nipple border at one of her prior breast biopsy locations. mobile. no skin dimpling, no nipple retraction, no lymphadenopathy. right breast without positive findings.   Cardiovascular Cardiovascular examination reveals -normal heart sounds, regular rate and rhythm with no  murmurs  and normal pedal pulses bilaterally.  Abdomen Inspection Inspection of the abdomen reveals - No Hernias. Palpation/Percussion Palpation and Percussion of the abdomen reveal - Soft, Non Tender, No Rebound tenderness, No Rigidity (guarding) and No hepatosplenomegaly. Auscultation Auscultation of the abdomen reveals - Bowel sounds normal.  Neurologic Neurologic evaluation reveals -alert and oriented x 3 with no impairment of recent or remote memory. Mental Status-Normal.  Musculoskeletal Global Assessment -Note: no gross deformities.  Normal Exam - Left-Upper Extremity Strength Normal and Lower Extremity Strength Normal. Normal Exam - Right-Upper Extremity Strength Normal and Lower Extremity Strength Normal.  Lymphatic Head & Neck  General Head & Neck Lymphatics: Bilateral - Description - Normal. Axillary  General Axillary Region: Bilateral - Description - Normal. Tenderness - Non Tender. Femoral & Inguinal  Generalized Femoral & Inguinal Lymphatics: Bilateral - Description - No Generalized lymphadenopathy.    Assessment & Plan Sara Klein MD; 12/06/2014 11:21 AM) PRIMARY CANCER OF LOWER-INNER QUADRANT OF LEFT FEMALE BREAST (174.3  C50.312) Impression: Patient presents with new stage I left breast cancer. She is good candidate for breast conservation therapy. We will also get oncotype on the post excision pathology iof appropriate.  We will plan a seed localized left breast lumpectomy with sentinel lymph node biopsy. The surgical procedure was described to the patient. I discussed the incision type and location and that we would need radiology involved on with a wire or seed marker and/or sentinel node.  The risks and benefits of the procedure were described to the patient and she wishes to proceed.  We discussed the risks bleeding, infection, damage to other structures, need for further procedures/surgeries. We discussed the risk of seroma. The patient was  advised if the area in the breast in cancer, we may need to go back to surgery for additional tissue to obtain negative margins or for a lymph node biopsy. The patient was advised that these are the most common complications, but that others can occur as well. They were advised against taking aspirin or other anti-inflammatory agents/blood thinners the week before surgery.  45 min spent in evaluation, examination, counseling, and coordination of care. >50% spent in counseling. Current Plans  Schedule for Surgery Pt Education - flb breast cancer surgery: discussed with patient and provided information.   Signed by Sara Klein, MD (12/06/2014 11:22 AM)

## 2014-12-13 NOTE — Discharge Instructions (Addendum)
[place] Office Phone Number [phone]  BREAST BIOPSY/ PARTIAL MASTECTOMY: POST OP INSTRUCTIONS  Always review your discharge instruction sheet given to you by the facility where your surgery was performed.  IF YOU HAVE DISABILITY OR FAMILY LEAVE FORMS, YOU MUST BRING THEM TO THE OFFICE FOR PROCESSING.  DO NOT GIVE THEM TO YOUR DOCTOR.  1. A prescription for pain medication may be given to you upon discharge.  Take your pain medication as prescribed, if needed.  If narcotic pain medicine is not needed, then you may take acetaminophen (Tylenol) or ibuprofen (Advil) as needed. 2. Take your usually prescribed medications unless otherwise directed 3. If you need a refill on your pain medication, please contact your pharmacy.  They will contact our office to request authorization.  Prescriptions will not be filled after 5pm or on week-ends. 4. You should eat very light the first 24 hours after surgery, such as soup, crackers, pudding, etc.  Resume your normal diet the day after surgery. 5. Most patients will experience some swelling and bruising in the breast.  Ice packs and a good support bra will help.  Swelling and bruising can take several days to resolve.  6. It is common to experience some constipation if taking pain medication after surgery.  Increasing fluid intake and taking a stool softener will usually help or prevent this problem from occurring.  A mild laxative (Milk of Magnesia or Miralax) should be taken according to package directions if there are no bowel movements after 48 hours. 7. Unless discharge instructions indicate otherwise, you may remove your bandages 48 hours after surgery, and you may shower at that time.  You may have steri-strips (small skin tapes) in place directly over the incision.  These strips should be left on the skin for 7-10 days.   Any sutures or staples will be removed at the office during your follow-up visit. 8. ACTIVITIES:  You may resume  regular daily activities (gradually increasing) beginning the next day.  Wearing a good support bra or sports bra (or the breast binder) minimizes pain and swelling.  You may have sexual intercourse when it is comfortable. a. You may drive when you no longer are taking prescription pain medication, you can comfortably wear a seatbelt, and you can safely maneuver your car and apply brakes. b. RETURN TO WORK:  __________1 week_______________ 9. You should see your doctor in the office for a follow-up appointment approximately two weeks after your surgery.  Your doctors nurse will typically make your follow-up appointment when she calls you with your pathology report.  Expect your pathology report 2-3 business days after your surgery.  You may call to check if you do not hear from [place] after three days.   WHEN TO CALL YOUR DOCTOR: 1. Fever over 101.0 2. Nausea and/or vomiting. 3. Extreme swelling or bruising. 4. Continued bleeding from incision. 5. Increased pain, redness, or drainage from the incision.  The clinic staff is available to answer your questions during regular business hours.  Please dont hesitate to call and ask to speak to one of the nurses for clinical concerns.  If you have a medical emergency, go to the nearest emergency room or call 911.  A surgeon from [facility] is always on call at the hospital.  For further questions, please visit [email or web address]    Post Anesthesia Home Care Instructions  Activity: Get plenty of rest for the remainder of the day. A responsible adult should stay with you for 24  hours following the procedure.  For the next 24 hours, DO NOT: -Drive a car -Paediatric nurse -Drink alcoholic beverages -Take any medication unless instructed by your physician -Make any legal decisions or sign important papers.  Meals: Start with liquid foods such as gelatin or soup. Progress to regular foods as tolerated. Avoid greasy, spicy, heavy  foods. If nausea and/or vomiting occur, drink only clear liquids until the nausea and/or vomiting subsides. Call your physician if vomiting continues.  Special Instructions/Symptoms: Your throat may feel dry or sore from the anesthesia or the breathing tube placed in your throat during surgery. If this causes discomfort, gargle with warm salt water. The discomfort should disappear within 24 hours.  If you had a scopolamine patch placed behind your ear for the management of post- operative nausea and/or vomiting:  1. The medication in the patch is effective for 72 hours, after which it should be removed.  Wrap patch in a tissue and discard in the trash. Wash hands thoroughly with soap and water. 2. You may remove the patch earlier than 72 hours if you experience unpleasant side effects which may include dry mouth, dizziness or visual disturbances. 3. Avoid touching the patch. Wash your hands with soap and water after contact with the patch.

## 2014-12-13 NOTE — Anesthesia Preprocedure Evaluation (Signed)
Anesthesia Evaluation  Patient identified by MRN, date of birth, ID band Patient awake    Reviewed: Allergy & Precautions, NPO status , Patient's Chart, lab work & pertinent test results  History of Anesthesia Complications (+) PONV  Airway Mallampati: I  TM Distance: >3 FB Neck ROM: Full    Dental  (+) Teeth Intact   Pulmonary neg pulmonary ROS,  breath sounds clear to auscultation        Cardiovascular Exercise Tolerance: Good Rhythm:Regular Rate:Normal     Neuro/Psych PSYCHIATRIC DISORDERS Depression negative neurological ROS     GI/Hepatic negative GI ROS, Neg liver ROS, PUD, GERD-  Medicated,  Endo/Other  negative endocrine ROS  Renal/GU negative Renal ROS  negative genitourinary   Musculoskeletal negative musculoskeletal ROS (+)   Abdominal   Peds negative pediatric ROS (+)  Hematology negative hematology ROS (+)   Anesthesia Other Findings   Reproductive/Obstetrics negative OB ROS                             Anesthesia Physical Anesthesia Plan  ASA: II  Anesthesia Plan: General   Post-op Pain Management:    Induction: Intravenous  Airway Management Planned: LMA  Additional Equipment:   Intra-op Plan:   Post-operative Plan: Extubation in OR  Informed Consent: I have reviewed the patients History and Physical, chart, labs and discussed the procedure including the risks, benefits and alternatives for the proposed anesthesia with the patient or authorized representative who has indicated his/her understanding and acceptance.   Dental advisory given  Plan Discussed with: CRNA  Anesthesia Plan Comments:         Anesthesia Quick Evaluation

## 2014-12-13 NOTE — Progress Notes (Signed)
Radiology staff performed nuc med inj. Versed and fentanyl given for relaxation. VSS. Pt tol well. Sister called to bedside and updated. Emotional support provided

## 2014-12-14 ENCOUNTER — Encounter (HOSPITAL_BASED_OUTPATIENT_CLINIC_OR_DEPARTMENT_OTHER): Payer: Self-pay | Admitting: General Surgery

## 2014-12-18 ENCOUNTER — Encounter: Payer: Self-pay | Admitting: *Deleted

## 2014-12-18 NOTE — Progress Notes (Signed)
Quick Note:  Please let patient know that margins and LN are negative! tx FB ______

## 2014-12-18 NOTE — Progress Notes (Signed)
Ordered onctoype per Dr. Burr Medico.  Requisition sent to pathology and confirmed receipt with [facility].

## 2014-12-20 ENCOUNTER — Ambulatory Visit: Payer: Commercial Managed Care - HMO | Admitting: Internal Medicine

## 2014-12-22 DIAGNOSIS — C50312 Malignant neoplasm of lower-inner quadrant of left female breast: Secondary | ICD-10-CM | POA: Diagnosis not present

## 2014-12-25 ENCOUNTER — Encounter (HOSPITAL_COMMUNITY): Payer: Self-pay

## 2014-12-29 ENCOUNTER — Encounter: Payer: Self-pay | Admitting: *Deleted

## 2014-12-29 NOTE — Progress Notes (Signed)
Received Oncotype score of 4/4%. Referral sent for appt with Dr. Pablo Ledger.

## 2015-01-02 ENCOUNTER — Telehealth: Payer: Self-pay | Admitting: Family Medicine

## 2015-01-02 NOTE — Telephone Encounter (Signed)
Please advise      KP 

## 2015-01-02 NOTE — Telephone Encounter (Signed)
Amoxicillin 875 mg bid--- use otc nasacort ,   Zyrtec Ov if no better

## 2015-01-02 NOTE — Telephone Encounter (Signed)
Relation to RC:VKFM Call back number:[phone] Pharmacy: [facility], [place] [phone] (Phone) [phone] (Fax)         Reason for call:  Lafayette Dragon, MD informed patient they would ask on patient behalf if an Rx can be sent over due to patient having a ? Sinus infection. Patient states if she doesn't have to come in for a office appoinment she would prefer not

## 2015-01-03 MED ORDER — AMOXICILLIN 875 MG PO TABS: 875.0000 mg | ORAL_TABLET | Freq: Two times a day (BID) | ORAL | Status: DC

## 2015-01-03 NOTE — Telephone Encounter (Signed)
Rx has been faxed and VM left advising the patient.     KP

## 2015-01-04 ENCOUNTER — Telehealth: Payer: Self-pay | Admitting: Hematology

## 2015-01-04 ENCOUNTER — Ambulatory Visit (HOSPITAL_BASED_OUTPATIENT_CLINIC_OR_DEPARTMENT_OTHER): Payer: Commercial Managed Care - HMO | Admitting: Hematology

## 2015-01-04 ENCOUNTER — Encounter: Payer: Self-pay | Admitting: Hematology

## 2015-01-04 VITALS — BP 129/68 | HR 77 | Temp 98.2°F | Resp 18 | Ht 66.0 in | Wt 157.8 lb

## 2015-01-04 DIAGNOSIS — K529 Noninfective gastroenteritis and colitis, unspecified: Secondary | ICD-10-CM

## 2015-01-04 DIAGNOSIS — C50312 Malignant neoplasm of lower-inner quadrant of left female breast: Secondary | ICD-10-CM

## 2015-01-04 DIAGNOSIS — F329 Major depressive disorder, single episode, unspecified: Secondary | ICD-10-CM | POA: Diagnosis not present

## 2015-01-04 NOTE — Progress Notes (Signed)
[facility]  [phone] [phone]) [phone]  Clinic Follow Up Note   Patient Care Team: Rosalita Chessman, DO as PCP - General [place] Dragon, MD as Consulting Physician (Gastroenterology) [name], MD as Consulting Physician (General Surgery) Truitt Merle, MD as Consulting Physician (Hematology) Thea Silversmith, MD as Consulting Physician (Radiation Oncology) Mauro Kaufmann, RN as Registered Nurse Rockwell Germany, RN as Registered Nurse Holley Bouche, NP as Nurse Practitioner (Nurse Practitioner) Sylvan Cheese, NP as Nurse Practitioner (Nurse Practitioner) 01/04/2015  CHIEF COMPLAINTS:  Follow up left breast cancer     Oncology History   Cancer of lower-inner quadrant of left female breast   Staging form: Breast, AJCC 7th Edition     Clinical stage from 12/06/2014: Stage IA (T1c, N0, M0) - Unsigned        Cancer of lower-inner quadrant of left female breast   11/28/2014 Breast US Highly suspicious 1.6 cm irregular shadowing mass at the site of palpable concern in the subareolar lower inner quadrant of the left breast at approximately 630.   11/29/2014 Receptors her2 Estrogen Receptor: 100%, POSITIVE, STRONG STAINING INTENSITY (PERFORMED MANUALLY) Progesterone Receptor: 95%, POSITIVE, STRONG STAINING INTENSITY (PERFORMED MANUALLY) Proliferation Marker Ki67: 10% (PERFORMED MANUALLY), Her2neu negative   11/29/2014 Initial Biopsy Breast, left, needle core biopsy, subareolar about 6:30 - INVASIVE DUCTAL CARCINOMA, SEE COMMENT.   11/30/2014 Initial Diagnosis Cancer of lower-inner quadrant of left female breast   12/13/2014 Surgery Left breast lumpectomy, with sentinel lymph nodes biopsy   12/13/2014 Pathology Results Left breast lumpectomy showed a 1.7 cm invasive ductal carcinoma, grade 1, margins were negative, (+) LVI, 5 Sentinel lymph nodes were negative,   12/13/2014 Oncotype testing RS 4, which predicts 10-year risk of distant recurrence 4% with  tamoxifen alone.    HISTORY OF PRESENTING ILLNESS:  Sara Barnes 74 y.o. female is here because of newly diagnosed left breast cancer. She presents to our multidisciplinary breast clinic with her sister today.  She had a plpable left breast mass 3 weeks ago, nontender, no significant skin or nipple change, she also reports some left arm discomfort lately, no swelling or limitation on the range of motion. She otherwise feels well, no other complains. She is little bit harder hearing.  She had benign left breast lesion long time ago and had bilateral inplants placed when when she was 64. She is postmenopausal, never taken any hormonal replacement, no family history of breast cancer.  She has colitis, followed by Dr. Cathlean Cower, she has intermittent diarrhea, she was on low dose prednisone and sulidine until a month ago. She is physically active, has good appetite and energy level. She lives alone, her sister lives very close to her. She has one son who lives in [place] also.  INTERIM HISTORY Sara Barnes returns for follow-up. She underwent left breast lumpectomy and sentinel lymph node biopsy on 12/13/2014. She tolerated surgery very well, and has recovered very well 2. She has minimal pain at his surgical incision, no left arm while in. No other new complaints.  MEDICAL HISTORY:  Past Medical History  Diagnosis Date  . Vitamin B12 deficiency   . Ulcerative colitis   . Indigestion   . 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 Complication of anesthesia     anxiety post-op after ear surgeries  . PONV (postoperative nausea and vomiting)   . Family history of adverse reaction to anesthesia     pt's sister has  hx. of post-op N/V  . Dental crowns present     recent root canal 11/2014  . Runny nose 12/07/2014    clear drainage, per pt.  . Seasonal allergies     SURGICAL HISTORY: Past Surgical History  Procedure Laterality Date  . Abdominal hysterectomy      partial    . Appendectomy    . Breast enhancement surgery    . Incision and drainage perirectal abscess N/A 09/28/2013    Procedure: IRRIGATION AND DEBRIDEMENT PERIANAL ABSCESS, proctoscopy;  Surgeon: Shann Medal, MD;  Location: [facility];  Service: General;  Laterality: N/A;  . Breast lumpectomy Left     age 39's  . Inner ear surgery Bilateral   . Colonoscopy with propofol  05/28/2011; 06/28/2014  . Breast lumpectomy with radioactive seed and sentinel lymph node biopsy Left 12/13/2014    Procedure: LEFT BREAST LUMPECTOMY WITH RADIOACTIVE SEED AND SENTINEL LYMPH NODE BIOPSY;  Surgeon: [name], MD;  Location: [facility];  Service: General;  Laterality: Left;   GYN HISTORY  Menarchal: 12 LMP: 89 (hysterectomy) Contraceptive: 2 years  HRT: no  G1P1: one son 16 yo, no breast feeding    SOCIAL HISTORY: Social History   Social History  . Marital Status: Widowed    Spouse Name: N/A  . Number of Children: N/A  . Years of Education: N/A   Occupational History  . 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 Smoking status: Never Smoker   . Smokeless tobacco: Never Used  . Alcohol Use: Yes     Comment: daily wine with dinner  . Drug Use: No  . Sexual Activity:    Partners: Male   Other Topics Concern  . Not on file   Social History Narrative   Treadmill/ walking--exercise    FAMILY HISTORY: Family History  Problem Relation Age of Onset  . Anesthesia problems Sister     post-op N/V  . Prostate cancer Brother 9  . Kidney disease Brother   . Diabetes Brother   . Emphysema Maternal Grandfather   . Asthma Sister   . Brain cancer Father   . Rheum arthritis Mother   . Rheum arthritis Brother     ALLERGIES:  is allergic to codeine; mesalamine; other; and sulfa antibiotics.  MEDICATIONS:  Current Outpatient Prescriptions  Medication Sig Dispense Refill  . acetaminophen (TYLENOL) 325 MG tablet Take 650 mg by mouth every 6 (six) hours as  needed.    Marland Kitchen amoxicillin (AMOXIL) 500 MG tablet Take 500 mg by mouth every 6 (six) hours. Will finish 12/09/2014    . amoxicillin (AMOXIL) 875 MG tablet Take 1 tablet (875 mg total) by mouth 2 (two) times daily. 20 tablet 0  . cyanocobalamin (,VITAMIN B-12,) 1000 MCG/ML injection INJECT 1 ML (1,000 MCG TOTAL) INTO THE MUSCLE EVERY 30 (THIRTY) DAYS. 3 mL 1  . famotidine (PEPCID) 20 MG tablet Take 20 mg by mouth 2 (two) times daily.    Marland Kitchen FLUoxetine (PROZAC) 20 MG capsule TAKE ONE CAPSULE BY MOUTH ONCE DAILY 90 capsule 1  . ibuprofen (ADVIL,MOTRIN) 200 MG tablet Take 400 mg by mouth every 6 (six) hours as needed for mild pain (pain).     Marland Kitchen oxyCODONE-acetaminophen (ROXICET) 5-325 MG per tablet Take 1-2 tablets by mouth every 4 (four) hours as needed for severe pain. 30 tablet 0   No current facility-administered medications for this visit.    REVIEW OF  SYSTEMS:   Constitutional: Denies fevers, chills or abnormal night sweats Eyes: Denies blurriness of vision, double vision or watery eyes Ears, nose, mouth, throat, and face: Denies mucositis or sore throat Respiratory: Denies cough, dyspnea or wheezes Cardiovascular: Denies palpitation, chest discomfort or lower extremity swelling Gastrointestinal:  Denies nausea, heartburn or change in bowel habits Skin: Denies abnormal skin rashes Lymphatics: Denies new lymphadenopathy or easy bruising Neurological:Denies numbness, tingling or new weaknesses Behavioral/Psych: Mood is stable, no new changes  All other systems were reviewed with the patient and are negative.  PHYSICAL EXAMINATION: ECOG PERFORMANCE STATUS: 0 - Asymptomatic  Filed Vitals:   01/04/15 1403  BP: 129/68  Pulse: 77  Temp: 98.2 F (36.8 C)  Resp: 18   Filed Weights   01/04/15 1403  Weight: 157 lb 12.8 oz (71.578 kg)    GENERAL:alert, no distress and comfortable SKIN: skin color, texture, turgor are normal, no rashes or significant lesions EYES: normal, conjunctiva are  pink and non-injected, sclera clear OROPHARYNX:no exudate, no erythema and lips, buccal mucosa, and tongue normal  NECK: supple, thyroid normal size, non-tender, without nodularity LYMPH:  no palpable lymphadenopathy in the cervical, axillary or inguinal LUNGS: clear to auscultation and percussion with normal breathing effort HEART: regular rate & rhythm and no murmurs and no lower extremity edema ABDOMEN:abdomen soft, non-tender and normal bowel sounds Musculoskeletal:no cyanosis of digits and no clubbing  PSYCH: alert & oriented x 3 with fluent speech NEURO: no focal motor/sensory deficits Breasts: Breast inspection showed them to be symmetrical with no nipple discharge. The left breast surgical incision site is clean, healing well, nontender, no skin erythema or nipple retraction or discharge. Palpitation of the right breast and axilla revealed no obvious mass that I could appreciate.   LABORATORY DATA:  I have reviewed the data as listed Lab Results  Component Value Date   WBC 9.2 12/06/2014   HGB 13.7 12/06/2014   HCT 41.1 12/06/2014   MCV 95.8 12/06/2014   PLT 303 12/06/2014    Recent Labs  01/26/14 1143 07/20/14 1111 12/06/14 0804  NA 135  --  138  K 4.0  --  4.1  CL 103  --   --   CO2 25  --  25  GLUCOSE 93  --  92  BUN 10  --  12.3  CREATININE 0.7  --  0.8  CALCIUM 9.4  --  9.4  PROT 7.9 7.2 6.5  ALBUMIN 4.1 4.0 3.3*  AST _0 ALT _1 ALKPHOS 92 84 89  BILITOT 0.7 0.5 0.37  BILIDIR 0.1 0.1  --    PATHOLOGY REPORT Diagnosis 12/13/2014 1. Breast, lumpectomy, left - INVASIVE GRADE I DUCTAL CARCINOMA, SPANNING 1.7 CM IN GREATEST DIMENSION. - ASSOCIATED INTERMEDIATE GRADE DUCTAL CARCINOMA IN SITU. - LYMPH/VASCULAR INVASION IS IDENTIFIED. - INVASIVE DUCTAL CARCINOMA IS CLOSE (LESS THAN 0.2 CM) FROM INFERIOR AND POSTERIOR MARGINS. - OTHER MARGINS ARE NEGATIVE. - SEE ONCOLOGY TEMPLATE BELOW. 2. Lymph node, sentinel, biopsy, left axillary #1 - ONE BENIGN  LYMPH NODE WITH NO TUMOR SEEN (0/1). 3. Lymph node, sentinel, biopsy, left axillary #2 - ONE BENIGN LYMPH NODE WITH NO TUMOR SEEN (0/1). 4. Lymph node, sentinel, biopsy, left axillary #3 - ONE BENIGN LYMPH NODE WITH NO TUMOR SEEN (0/1). 5. Lymph node, sentinel, biopsy, left axillary #4 - ONE BENIGN LYMPH NODE WITH NO TUMOR SEEN (0/1). 6. Lymph node, sentinel, biopsy, Left axillary #5 - ONE BENIGN LYMPH NODE WITH NO TUMOR SEEN (  0/1). Microscopic Comment 1. BREAST, INVASIVE TUMOR, WITH LYMPH NODES PRESENT Specimen, including laterality and lymph node sampling (sentinel, non-sentinel): Left partial breast with left sentinel lymph node sampling. Procedure: Left breast lumpectomy with left sentinel lymph node biopsies. Histologic type: Invasive ductal carcinoma. Grade: 1. Tubule formation: 2. Nuclear pleomorphism: 2. Mitotic: 1. Tumor size (gross measurement): 1.7 cm. Margins: Invasive, distance to closest margin: Invasive ductal carcinoma is close (less than 0.2 cm) from inferior and posterior margins. In-situ, distance to closest margin: Ductal carcinoma in situ is away (at least 0.4 cm) from all margins. Lymphovascular invasion: Yes, lymph/vascular is identified. Ductal carcinoma in situ: Yes. Grade: Intermediate. Extensive intraductal component: No. Lobular neoplasia: Not identified. Tumor focality: Unifocal. Treatment effect: Not applicable. Extent of tumor: Tumor confined to breast parenchyma. Skin: Not received. Nipple: Not received. Skeletal muscle: Not received. Lymph nodes: Examined: 5 Sentinel. 0 Non-sentinel. 5 Total. Lymph nodes with metastasis: 0. Isolated tumor cells (< 0.2 mm): 0. Micrometastasis: (> 0.2 mm and < 2.0 mm): 0. Macrometastasis: (> 2.0 mm): 0. Extracapsular extension: Not applicable. Breast prognostic profile: Performed on previous case [id number]. Estrogen receptor: 100%, positive. Progesterone receptor: 95%, positive. Her 2 neu: 1.31 ratio,  negative. Ki-67: 10%. Non-neoplastic breast: Fibrocystic changes. TNM: pT1c, pN0.  Comments: As Her-2 neu was previously negative, this will be repeated on the invasive tumor and reported in an addendum to follow. (RH:ds 12/15/14) ROBERT  1. FLUORESCENCE IN-SITU HYBRIDIZATION Results: HER2 - NEGATIVE RATIO OF HER2/CEP17 SIGNALS 1.42 AVERAGE HER2 COPY NUMBER PER CELL 1.85  RADIOGRAPHIC STUDIES: I have personally reviewed the radiological images as listed and agreed with the findings in the report.    ASSESSMENT & PLAN:  74 year old patient female, with past medical history of colitis, depression, otherwise in good health, who presents with a palpable left breast mass.  1. Left breast invasive ductal carcinoma, cT1bN0M0, stage IA, ER+/PR+, HER2 (-) -I reviewed her surgical pathology findings with her in details. -I discussed the Oncotype DX test result with her. The recurrence score is 4, which predicts 4% of distant recurrence risk in 10 years with tamoxifen. -Giving the low recurrence score on Oncotype test, she will likely do very well, and she would not need adjuvant chemotherapy. -Giving the strong ER/PR positivity, and her postmenopausal status, I recommend aromatase inhibitor as adjuvant endocrine therapy to reduce the risk of cancer recurrence. Potential side effects was reviewed with her. She agrees to proceed after she completes adjuvant breast radiation -She is going to see Dr. Pablo Ledger back show, and start adjuvant breast irradiation.  2. Colitis, arthritis, depression -She will continue follow-up with her primary care physician and Dr. Cathlean Cower  Plan: -She is going to start breast irradiation serum -I'll see her back in 2 months, to finalize her adjuvant endocrine therapy with aromatase inhibitor.   All questions were answered. The patient knows to call the clinic with any problems, questions or concerns. I spent 25 minutes counseling the patient face to face. The total  time spent in the appointment was 30 minutes and more than 50% was on counseling.     Truitt Merle, MD 01/04/2015 7:58 AM

## 2015-01-04 NOTE — Telephone Encounter (Signed)
Gave patient avs report and appointment for November.

## 2015-01-17 ENCOUNTER — Institutional Professional Consult (permissible substitution): Payer: Self-pay | Admitting: Radiation Oncology

## 2015-01-17 NOTE — Progress Notes (Signed)
Location of Breast Cancer:lower-inner quadrant of left breast  Histology per Pathology Report:12/13/14 Diagnosis 1. Breast, lumpectomy, left - INVASIVE GRADE I DUCTAL CARCINOMA, SPANNING 1.7 CM IN GREATEST DIMENSION. - ASSOCIATED INTERMEDIATE GRADE DUCTAL CARCINOMA IN SITU. - LYMPH/VASCULAR INVASION IS IDENTIFIED. - INVASIVE DUCTAL CARCINOMA IS CLOSE (LESS THAN 0.2 CM) FROM INFERIOR AND POSTERIOR MARGINS. - OTHER MARGINS ARE NEGATIVE. - SEE ONCOLOGY TEMPLATE BELOW. 2. Lymph node, sentinel, biopsy, left axillary #1 - ONE BENIGN LYMPH NODE WITH NO TUMOR SEEN (0/1). 3. Lymph node, sentinel, biopsy, left axillary #2 - ONE BENIGN LYMPH NODE WITH NO TUMOR SEEN (0/1).  FINAL for Sara Barnes, Sara Barnes ([id number]) Diagnosis(continued) 4. Lymph node, sentinel, biopsy, left axillary #3 - ONE BENIGN LYMPH NODE WITH NO TUMOR SEEN (0/1). 5. Lymph node, sentinel, biopsy, left axillary #4 - ONE BENIGN LYMPH NODE WITH NO TUMOR SEEN (0/1). 6. Lymph node, sentinel, biopsy, Left axillary #5 - ONE BENIGN LYMPH NODE WITH NO TUMOR SEEN (0/1).  Receptor Status: ER(+), PR (+), Her2-neu (-)  Did patient present with symptoms (if so, please note symptoms) or was this found on screening mammography?found on palpation accidentally as patient thought she pulled a muscle and upon feeling felt mass confirmed by mammogram.  Past/Anticipated interventions by surgeon, if any:12/13/14:BREAST LUMPECTOMY WITH RADIOACTIVE SEED AND   Past/Anticipated interventions by medical oncology, if any: Chemotherapy  Lymphedema issues, if any:  Pain issues, if any: Soreness of left arm with some numbness of fingers.Great discomfort is sinus congestion which she is on amoxicillin.Patient to see PCP tomorrow.   SAFETY ISSUES:  Prior radiation? No  Pacemaker/ICD?No  Possible current pregnancy?post-menopausal  Is the patient on methotrexate? No  Current Complaints / other details:Menarche age 16.first full-term birth age  35.HOrmonal replacement therapy for 25 years. Very hard of hearing has bilateral ear implants.    Arlyss Repress, RN 01/17/2015,11:11 AM  BP 129/80 mmHg  Pulse 72  Temp(Src) 98.3 F (36.8 C)  Wt 159 lb 12.8 oz (72.485 kg)

## 2015-01-17 NOTE — Progress Notes (Signed)
   Department of Radiation Oncology  Phone:  [phone] Fax:        [phone]   Name: Sara Barnes MRN: [id number]  DOB: 11/21/1940  Date: 01/18/2015  Follow Up Visit Note  Diagnosis: Cancer of lower-inner quadrant of left female breast   Staging form: Breast, AJCC 7th Edition     Clinical stage from 12/06/2014: Stage IA (T1c, N0, M0) - Unsigned     Pathologic stage from 12/13/2014: Stage IA (T1c, N0, cM0) - Unsigned  Interval History: Sara Barnes presents today for routine followup. She had a left lumpectomy on 8/17 which showed a 1.7 cm invasive ductal carcinoma with lymphovascular invasion. 0/5 lymph nodes were positive. Negative margins. She has recovered well from surgery. ER/PR positive at 100% and 90%. Biggest complaint is sinus congestion. She is accompanied by her sister and is ready to begin radiation. Her oncotype was low and she will not require chemotherapy.  Physical Exam:  Filed Vitals:   01/18/15 0935  BP: 129/80  Pulse: 72  Temp: 98.3 F (36.8 C)  Weight: 159 lb 12.8 oz (72.485 kg)   Well healed periareolar incision. Sentinel lymph node incision also well healed.  IMPRESSION: Sara Barnes is a 74 y.o. female with a T1N0 left breast cancer.  PLAN: I spoke to the patient today regarding her diagnosis and options for treatment. We discussed the equivalence in terms of survival and local failure between mastectomy and breast conservation. We discussed the role of radiation in decreasing local failures in patients who undergo lumpectomy. We discussed the process of simulation and the placement tattoos. We discussed 16 treatments as an outpatient. We discussed the possibility of asymptomatic lung damage. We discussed the low likelihood of secondary malignancies. We discussed the possible side effects including but not limited to skin redness, fatigue, permanent skin darkening, and breast swelling.  We discussed the use of cardiac sparing with deep inspiration breath hold if  needed.  She is scheduled to CT sim next week and she signed informed consent.   This document serves as a record of services personally performed by Thea Silversmith, MD. It was created on her behalf by Darcus Austin, a trained medical scribe. The creation of this record is based on the scribe's personal observations and the provider's statements to them. This document has been checked and approved by the attending provider.   Thea Silversmith, MD

## 2015-01-18 ENCOUNTER — Ambulatory Visit
Admission: RE | Admit: 2015-01-18 | Discharge: 2015-01-18 | Disposition: A | Discharge status: Home / Self Care | Payer: Commercial Managed Care - HMO | Source: Ambulatory Visit | Attending: Radiation Oncology | Admitting: Radiation Oncology

## 2015-01-18 VITALS — BP 129/80 | HR 72 | Temp 98.3°F | Wt 159.8 lb

## 2015-01-18 DIAGNOSIS — C50312 Malignant neoplasm of lower-inner quadrant of left female breast: Secondary | ICD-10-CM | POA: Diagnosis not present

## 2015-01-18 DIAGNOSIS — Z510 Encounter for antineoplastic radiation therapy: Secondary | ICD-10-CM | POA: Insufficient documentation

## 2015-01-18 DIAGNOSIS — Z170 Estrogen receptor positive status [ER+]: Secondary | ICD-10-CM | POA: Diagnosis not present

## 2015-01-19 ENCOUNTER — Encounter: Payer: Self-pay | Admitting: Family Medicine

## 2015-01-19 ENCOUNTER — Ambulatory Visit (INDEPENDENT_AMBULATORY_CARE_PROVIDER_SITE_OTHER): Payer: Commercial Managed Care - HMO | Admitting: Family Medicine

## 2015-01-19 VITALS — BP 140/74 | HR 74 | Temp 98.1°F | Wt 159.0 lb

## 2015-01-19 DIAGNOSIS — J0100 Acute maxillary sinusitis, unspecified: Secondary | ICD-10-CM

## 2015-01-19 DIAGNOSIS — J019 Acute sinusitis, unspecified: Secondary | ICD-10-CM | POA: Insufficient documentation

## 2015-01-19 DIAGNOSIS — J018 Other acute sinusitis: Secondary | ICD-10-CM

## 2015-01-19 MED ORDER — METHYLPREDNISOLONE ACETATE 80 MG/ML IJ SUSP
80.0000 mg | Freq: Once | INTRAMUSCULAR | Status: AC
  Administered 2015-01-19: 80 mg via INTRAMUSCULAR

## 2015-01-19 MED ORDER — LEVOFLOXACIN 500 MG PO TABS: 500.0000 mg | ORAL_TABLET | Freq: Every day | ORAL | Status: DC

## 2015-01-19 NOTE — Assessment & Plan Note (Signed)
levaquin x 10 days Depo medrol 80 mg IM

## 2015-01-19 NOTE — Progress Notes (Signed)
Patient ID: ANGENETTE Barnes, female    DOB: Mar 09, 1941  Age: 74 y.o. MRN: [id number]    Subjective:  Subjective HPI Sara Barnes presents for sinusitis.  Symptoms started before surgery.  She was only taken amoxicillin 875 1x a day---  Pharmacy wrote directions wrong on bottle.       Review of Systems  Constitutional: Positive for chills. Negative for fever.  HENT: Positive for congestion, postnasal drip, rhinorrhea and sinus pressure.   Respiratory: Negative for cough, chest tightness, shortness of breath and wheezing.   Cardiovascular: Negative for chest pain, palpitations and leg swelling.  Allergic/Immunologic: Negative for environmental allergies.    History Past Medical History  Diagnosis Date  . Vitamin B12 deficiency   . Ulcerative colitis   . Indigestion   . 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 Complication of anesthesia     anxiety post-op after ear surgeries  . PONV (postoperative nausea and vomiting)   . Family history of adverse reaction to anesthesia     pt's sister has hx. of post-op N/V  . Dental crowns present     recent root canal 11/2014  . Runny nose 12/07/2014    clear drainage, per pt.  . Seasonal allergies     She has past surgical history that includes Abdominal hysterectomy; Appendectomy; Breast enhancement surgery; Incision and drainage perirectal abscess (N/A, 09/28/2013); Breast lumpectomy (Left); Inner ear surgery (Bilateral); Colonoscopy with propofol (05/28/2011; 06/28/2014); and Breast lumpectomy with radioactive seed and sentinel lymph node biopsy (Left, 12/13/2014).   Her family history includes Anesthesia problems in her sister; Asthma in her sister; Brain cancer in her father; Diabetes in her brother; Emphysema in her maternal grandfather; Kidney disease in her brother; Prostate cancer (age of onset: 54) in her brother; Rheum arthritis in her brother and mother.She reports that she has never smoked. She has never used  smokeless tobacco. She reports that she drinks alcohol. She reports that she does not use illicit drugs.  Current Outpatient Prescriptions on File Prior to Visit  Medication Sig Dispense Refill  . acetaminophen (TYLENOL) 325 MG tablet Take 650 mg by mouth every 6 (six) hours as needed.    . cyanocobalamin (,VITAMIN B-12,) 1000 MCG/ML injection INJECT 1 ML (1,000 MCG TOTAL) INTO THE MUSCLE EVERY 30 (THIRTY) DAYS. 3 mL 1  . famotidine (PEPCID) 20 MG tablet Take 20 mg by mouth 2 (two) times Barnes.    Marland Kitchen FLUoxetine (PROZAC) 20 MG capsule TAKE ONE CAPSULE BY MOUTH ONCE Barnes 90 capsule 1  . ibuprofen (ADVIL,MOTRIN) 200 MG tablet Take 400 mg by mouth every 6 (six) hours as needed for mild pain (pain).      No current facility-administered medications on file prior to visit.     Objective:  Objective Physical Exam  Constitutional: She is oriented to person, place, and time. She appears well-developed and well-nourished.  HENT:  Right Ear: Tympanic membrane, external ear and ear canal normal. Decreased hearing is noted.  Left Ear: Tympanic membrane, external ear and ear canal normal. Decreased hearing is noted.  Nose: Rhinorrhea present. Right sinus exhibits maxillary sinus tenderness and frontal sinus tenderness. Left sinus exhibits maxillary sinus tenderness and frontal sinus tenderness.  Mouth/Throat: Posterior oropharyngeal erythema present. No posterior oropharyngeal edema.  + PND + errythema  Eyes: Conjunctivae are normal. Right eye exhibits no discharge. Left eye exhibits no discharge.  Cardiovascular: Normal rate, regular rhythm and normal heart sounds.   No murmur  heard. Pulmonary/Chest: Effort normal and breath sounds normal. No respiratory distress. She has no wheezes. She has no rales. She exhibits no tenderness.  Musculoskeletal: She exhibits no edema.  Lymphadenopathy:    She has cervical adenopathy.  Neurological: She is alert and oriented to person, place, and time.   BP  140/74 mmHg  Pulse 74  Temp(Src) 98.1 F (36.7 C) (Oral)  Wt 159 lb (72.122 kg)  SpO2 97% Wt Readings from Last 3 Encounters:  01/19/15 159 lb (72.122 kg)  01/18/15 159 lb 12.8 oz (72.485 kg)  01/04/15 157 lb 12.8 oz (71.578 kg)     Lab Results  Component Value Date   WBC 9.2 12/06/2014   HGB 13.7 12/06/2014   HCT 41.1 12/06/2014   PLT 303 12/06/2014   GLUCOSE 92 12/06/2014   CHOL 166 07/20/2014   TRIG 172.0* 07/20/2014   HDL 50.70 07/20/2014   LDLDIRECT 108.6 01/26/2014   LDLCALC 81 07/20/2014   ALT 10 12/06/2014   AST 9 12/06/2014   NA 138 12/06/2014   K 4.1 12/06/2014   CL 103 01/26/2014   CREATININE 0.8 12/06/2014   BUN 12.3 12/06/2014   CO2 25 12/06/2014   TSH 1.24 01/03/2013   INR 1.05 09/27/2013   HGBA1C 5.5 12/18/2010   MICROALBUR 5.1 Repeated and verified X2.* 01/03/2013    No results found.   Assessment & Plan:  Plan I have discontinued Ms. Cerrito's amoxicillin. I am also having her start on levofloxacin. Additionally, I am having her maintain her ibuprofen, FLUoxetine, cyanocobalamin, acetaminophen, and famotidine. We will continue to administer methylPREDNISolone acetate.  Meds ordered this encounter  Medications  . levofloxacin (LEVAQUIN) 500 MG tablet    Sig: Take 1 tablet (500 mg total) by mouth Barnes.    Dispense:  10 tablet    Refill:  0  . methylPREDNISolone acetate (DEPO-MEDROL) injection 80 mg    Sig:     Problem List Items Addressed This Visit    Sinusitis, acute - Primary    levaquin x 10 days Depo medrol 80 mg IM       Relevant Medications   levofloxacin (LEVAQUIN) 500 MG tablet   methylPREDNISolone acetate (DEPO-MEDROL) injection 80 mg      Follow-up: Return if symptoms worsen or fail to improve.  [name], DO

## 2015-01-19 NOTE — Progress Notes (Deleted)
Patient ID: Sara Barnes, female   DOB: 25-Apr-1941, 74 y.o.   MRN: [id number]   Subjective:    Patient ID: Sara Barnes, female    DOB: April 19, 1941, 74 y.o.   MRN: [id number]  Chief Complaint  Patient presents with  . Sinusitis    x's 3 weeks with facial pain, jaw pain, sinus drainage, and congestion. Amox did not help    HPI Patient is in today for ***  Past Medical History  Diagnosis Date  . Vitamin B12 deficiency   . Ulcerative colitis   . Indigestion   . 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 Complication of anesthesia     anxiety post-op after ear surgeries  . PONV (postoperative nausea and vomiting)   . Family history of adverse reaction to anesthesia     pt's sister has hx. of post-op N/V  . Dental crowns present     recent root canal 11/2014  . Runny nose 12/07/2014    clear drainage, per pt.  . Seasonal allergies     Past Surgical History  Procedure Laterality Date  . Abdominal hysterectomy      partial  . Appendectomy    . Breast enhancement surgery    . Incision and drainage perirectal abscess N/A 09/28/2013    Procedure: IRRIGATION AND DEBRIDEMENT PERIANAL ABSCESS, proctoscopy;  Surgeon: Shann Medal, MD;  Location: [facility];  Service: General;  Laterality: N/A;  . Breast lumpectomy Left     age 63's  . Inner ear surgery Bilateral   . Colonoscopy with propofol  05/28/2011; 06/28/2014  . Breast lumpectomy with radioactive seed and sentinel lymph node biopsy Left 12/13/2014    Procedure: LEFT BREAST LUMPECTOMY WITH RADIOACTIVE SEED AND SENTINEL LYMPH NODE BIOPSY;  Surgeon: [name], MD;  Location: [facility];  Service: General;  Laterality: Left;    Family History  Problem Relation Age of Onset  . Anesthesia problems Sister     post-op N/V  . Prostate cancer Brother 66  . Kidney disease Brother   . Diabetes Brother   . Emphysema Maternal Grandfather   . Asthma Sister   . Brain cancer Father   . Rheum arthritis  Mother   . Rheum arthritis Brother     Social History   Social History  . Marital Status: Widowed    Spouse Name: N/A  . Number of Children: N/A  . Years of Education: N/A   Occupational History  . 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 Smoking status: Never Smoker   . Smokeless tobacco: Never Used  . Alcohol Use: Yes     Comment: daily wine with dinner  . Drug Use: No  . Sexual Activity:    Partners: Male   Other Topics Concern  . Not on file   Social History Narrative   Treadmill/ walking--exercise    Outpatient Prescriptions Prior to Visit  Medication Sig Dispense Refill  . acetaminophen (TYLENOL) 325 MG tablet Take 650 mg by mouth every 6 (six) hours as needed.    . cyanocobalamin (,VITAMIN B-12,) 1000 MCG/ML injection INJECT 1 ML (1,000 MCG TOTAL) INTO THE MUSCLE EVERY 30 (THIRTY) DAYS. 3 mL 1  . famotidine (PEPCID) 20 MG tablet Take 20 mg by mouth 2 (two) times daily.    Marland Kitchen FLUoxetine (PROZAC) 20 MG capsule TAKE ONE CAPSULE BY MOUTH ONCE DAILY 90 capsule 1  .  ibuprofen (ADVIL,MOTRIN) 200 MG tablet Take 400 mg by mouth every 6 (six) hours as needed for mild pain (pain).     Marland Kitchen amoxicillin (AMOXIL) 875 MG tablet Take 1 tablet (875 mg total) by mouth 2 (two) times daily. 20 tablet 0   No facility-administered medications prior to visit.    Allergies  Allergen Reactions  . Codeine Nausea And Vomiting  . Mesalamine Nausea And Vomiting  . Other Itching    PERFUMED LOTIONS  . Oxycodone-Acetaminophen Rash    Itching & rash  . Sulfa Antibiotics Rash    Review of Systems  Constitutional: Negative for fever and malaise/fatigue.  HENT: Negative for congestion.   Eyes: Negative for discharge.  Respiratory: Negative for shortness of breath.   Cardiovascular: Negative for chest pain, palpitations and leg swelling.  Gastrointestinal: Negative for nausea and abdominal pain.  Genitourinary: Negative for dysuria.  Musculoskeletal: Negative  for falls.  Skin: Negative for rash.  Neurological: Negative for loss of consciousness and headaches.  Endo/Heme/Allergies: Negative for environmental allergies.  Psychiatric/Behavioral: Negative for depression. The patient is not nervous/anxious.        Objective:    Physical Exam  BP 140/74 mmHg  Pulse 74  Temp(Src) 98.1 F (36.7 C) (Oral)  Wt 159 lb (72.122 kg)  SpO2 97% Wt Readings from Last 3 Encounters:  01/19/15 159 lb (72.122 kg)  01/18/15 159 lb 12.8 oz (72.485 kg)  01/04/15 157 lb 12.8 oz (71.578 kg)     Lab Results  Component Value Date   WBC 9.2 12/06/2014   HGB 13.7 12/06/2014   HCT 41.1 12/06/2014   PLT 303 12/06/2014   GLUCOSE 92 12/06/2014   CHOL 166 07/20/2014   TRIG 172.0* 07/20/2014   HDL 50.70 07/20/2014   LDLDIRECT 108.6 01/26/2014   LDLCALC 81 07/20/2014   ALT 10 12/06/2014   AST 9 12/06/2014   NA 138 12/06/2014   K 4.1 12/06/2014   CL 103 01/26/2014   CREATININE 0.8 12/06/2014   BUN 12.3 12/06/2014   CO2 25 12/06/2014   TSH 1.24 01/03/2013   INR 1.05 09/27/2013   HGBA1C 5.5 12/18/2010   MICROALBUR 5.1 Repeated and verified X2.* 01/03/2013    Lab Results  Component Value Date   TSH 1.24 01/03/2013   Lab Results  Component Value Date   WBC 9.2 12/06/2014   HGB 13.7 12/06/2014   HCT 41.1 12/06/2014   MCV 95.8 12/06/2014   PLT 303 12/06/2014   Lab Results  Component Value Date   NA 138 12/06/2014   K 4.1 12/06/2014   CHLORIDE 106 12/06/2014   CO2 25 12/06/2014   GLUCOSE 92 12/06/2014   BUN 12.3 12/06/2014   CREATININE 0.8 12/06/2014   BILITOT 0.37 12/06/2014   ALKPHOS 89 12/06/2014   AST 9 12/06/2014   ALT 10 12/06/2014   PROT 6.5 12/06/2014   ALBUMIN 3.3* 12/06/2014   CALCIUM 9.4 12/06/2014   ANIONGAP 7 12/06/2014   EGFR 78* 12/06/2014   GFR 93.50 01/26/2014   Lab Results  Component Value Date   CHOL 166 07/20/2014   Lab Results  Component Value Date   HDL 50.70 07/20/2014   Lab Results  Component Value  Date   LDLCALC 81 07/20/2014   Lab Results  Component Value Date   TRIG 172.0* 07/20/2014   Lab Results  Component Value Date   CHOLHDL 3 07/20/2014   Lab Results  Component Value Date   HGBA1C 5.5 12/18/2010  Assessment & Plan:   Problem List Items Addressed This Visit    None      I have discontinued Ms. Kinderman's amoxicillin. I am also having her maintain her ibuprofen, FLUoxetine, cyanocobalamin, acetaminophen, and famotidine.  No orders of the defined types were placed in this encounter.     Elizabeth Sauer, LPN

## 2015-01-19 NOTE — Progress Notes (Signed)
Pre visit review using our clinic review tool, if applicable. No additional management support is needed unless otherwise documented below in the visit note. 

## 2015-01-19 NOTE — Patient Instructions (Addendum)
Nasocort, Rhinocort, Flonase nasal sprays over the counter  Sinusitis Sinusitis is redness, soreness, and inflammation of the paranasal sinuses. Paranasal sinuses are air pockets within the bones of your face (beneath the eyes, the middle of the forehead, or above the eyes). In healthy paranasal sinuses, mucus is able to drain out, and air is able to circulate through them by way of your nose. However, when your paranasal sinuses are inflamed, mucus and air can become trapped. This can allow bacteria and other germs to grow and cause infection. Sinusitis can develop quickly and last only a short time (acute) or continue over a long period (chronic). Sinusitis that lasts for more than 12 weeks is considered chronic.  CAUSES  Causes of sinusitis include:  Allergies.  Structural abnormalities, such as displacement of the cartilage that separates your nostrils (deviated septum), which can decrease the air flow through your nose and sinuses and affect sinus drainage.  Functional abnormalities, such as when the small hairs (cilia) that line your sinuses and help remove mucus do not work properly or are not present. SIGNS AND SYMPTOMS  Symptoms of acute and chronic sinusitis are the same. The primary symptoms are pain and pressure around the affected sinuses. Other symptoms include:  Upper toothache.  Earache.  Headache.  Bad breath.  Decreased sense of smell and taste.  A cough, which worsens when you are lying flat.  Fatigue.  Fever.  Thick drainage from your nose, which often is green and may contain pus (purulent).  Swelling and warmth over the affected sinuses. DIAGNOSIS  Your health care provider will perform a physical exam. During the exam, your health care provider may:  Look in your nose for signs of abnormal growths in your nostrils (nasal polyps).  Tap over the affected sinus to check for signs of infection.  View the inside of your sinuses (endoscopy) using an imaging  device that has a light attached (endoscope). If your health care provider suspects that you have chronic sinusitis, one or more of the following tests may be recommended:  Allergy tests.  Nasal culture. A sample of mucus is taken from your nose, sent to a lab, and screened for bacteria.  Nasal cytology. A sample of mucus is taken from your nose and examined by your health care provider to determine if your sinusitis is related to an allergy. TREATMENT  Most cases of acute sinusitis are related to a viral infection and will resolve on their own within 10 days. Sometimes medicines are prescribed to help relieve symptoms (pain medicine, decongestants, nasal steroid sprays, or saline sprays).  However, for sinusitis related to a bacterial infection, your health care provider will prescribe antibiotic medicines. These are medicines that will help kill the bacteria causing the infection.  Rarely, sinusitis is caused by a fungal infection. In theses cases, your health care provider will prescribe antifungal medicine. For some cases of chronic sinusitis, surgery is needed. Generally, these are cases in which sinusitis recurs more than 3 times per year, despite other treatments. HOME CARE INSTRUCTIONS   Drink plenty of water. Water helps thin the mucus so your sinuses can drain more easily.  Use a humidifier.  Inhale steam 3 to 4 times a day (for example, sit in the bathroom with the shower running).  Apply a warm, moist washcloth to your face 3 to 4 times a day, or as directed by your health care provider.  Use saline nasal sprays to help moisten and clean your sinuses.  Take medicines  only as directed by your health care provider.  If you were prescribed either an antibiotic or antifungal medicine, finish it all even if you start to feel better. SEEK IMMEDIATE MEDICAL CARE IF:  You have increasing pain or severe headaches.  You have nausea, vomiting, or drowsiness.  You have swelling  around your face.  You have vision problems.  You have a stiff neck.  You have difficulty breathing. MAKE SURE YOU:   Understand these instructions.  Will watch your condition.  Will get help right away if you are not doing well or get worse. Document Released: 04/14/2005 Document Revised: 08/29/2013 Document Reviewed: 04/29/2011 [facility] Patient Information 2015 [place], [place]. This information is not intended to replace advice given to you by your health care provider. Make sure you discuss any questions you have with your health care provider.

## 2015-01-25 ENCOUNTER — Ambulatory Visit
Admission: RE | Admit: 2015-01-25 | Discharge: 2015-01-25 | Disposition: A | Discharge status: Home / Self Care | Payer: Commercial Managed Care - HMO | Source: Ambulatory Visit | Attending: Radiation Oncology | Admitting: Radiation Oncology

## 2015-01-25 DIAGNOSIS — Z510 Encounter for antineoplastic radiation therapy: Secondary | ICD-10-CM | POA: Diagnosis not present

## 2015-01-25 DIAGNOSIS — Z170 Estrogen receptor positive status [ER+]: Secondary | ICD-10-CM | POA: Diagnosis not present

## 2015-01-25 DIAGNOSIS — C50312 Malignant neoplasm of lower-inner quadrant of left female breast: Secondary | ICD-10-CM

## 2015-01-25 NOTE — Progress Notes (Signed)
Name: Sara Barnes   MRN: [id number]  Date:  01/25/2015  DOB: 1940/11/09  Status:outpatient   DIAGNOSIS: Left Breast cancer.  CONSENT VERIFIED: yes SET UP: Patient is setup supine  IMMOBILIZATION:  The following immobilization was used:Custom Moldable Pillow, breast board.  NARRATIVE: Sara Barnes was brought to the [facility].  Identity was confirmed.  All relevant records and images related to the planned course of therapy were reviewed.  Then, the patient was positioned in a stable reproducible clinical set-up for radiation therapy.  Wires were placed to delineate the clinical extent of breast tissue. A wire was placed on the scar as well.  CT images were obtained.  An isocenter was placed. Skin markings were placed.  The position of the heart was then analyzed.  Due to the proximity of the heart to the chest wall, I felt she would benefit from deep inspiration breath hold for cardiac sparing.  She was then coached and rescanned in the breath hold position.  Acceptable cardiac sparing was achieved. The CT images were loaded into the planning software where the target and avoidance structures were contoured.  The radiation prescription was entered and confirmed. The patient was discharged in stable condition and tolerated simulation well.    TREATMENT PLANNING NOTE/3D Simulation Note Treatment planning then occurred. I have requested : MLC's, isodose plan, basic dose calculation  3D simulation was performed.  I personally constructed 3 complex treatment devices in the form of MLCs which will be used for beam modification and to protect critical structures including the heart and lung.  I have requested a dose volume histogram of the heart lung and tumor cavity.   Special treatment procedure was performed today due to the extra time and effort required by myself to plan and prepare this patient for deep inspiration breath hold technique.  I have determined cardiac sparing to be of  benefit to this patient to prevent long term cardiac damage due to radiation of the heart.  Bellows were placed on the patient's abdomen. To facilitate cardiac sparing, the patient was coached by the radiation therapists on breath hold techniques and breathing practice was performed. Practice waveforms were obtained. The patient was then scanned while maintaining breath hold in the treatment position.  This image was then transferred over to the imaging specialist. The imaging specialist then created a fusion of the free breathing and breath hold scans using the chest wall as the stable structure. I personally reviewed the fusion in axial, coronal and sagittal image planes.  Excellent cardiac sparing was obtained.  I felt the patient is an appropriate candidate for breath hold and the patient will be treated as such.  The image fusion was then reviewed with the patient to reinforce the necessity of reproducible breath hold.    Radiation Oncology         [phone] ________________________________  Name: Sara Barnes MRN: [id number]  Date: 01/25/2015  DOB: 10-Dec-1940  Optical Surface Tracking Plan:  Since intensity modulated radiotherapy (IMRT) and 3D conformal radiation treatment methods are predicated on accurate and precise positioning for treatment, intrafraction motion monitoring is medically necessary to ensure accurate and safe treatment delivery.  The ability to quantify intrafraction motion without excessive ionizing radiation dose can only be performed with optical surface tracking. Accordingly, surface imaging offers the opportunity to obtain 3D measurements of patient position throughout IMRT and 3D treatments without excessive radiation exposure.  I am ordering optical surface tracking for this patient's upcoming course  of radiotherapy. ________________________________  Toney Sang, MD 01/25/2015 8:32 AM    Reference:   Sara Barnes, et al. Surface  imaging-based analysis of intrafraction motion for breast radiotherapy patients.Journal of [place], n. 6, nov. 2014. ISSN [id number].   Available at: <http://www.jacmp.org/index.php/jacmp/article/view/4957>.

## 2015-01-30 ENCOUNTER — Encounter: Payer: Self-pay | Admitting: Family Medicine

## 2015-01-30 ENCOUNTER — Ambulatory Visit (INDEPENDENT_AMBULATORY_CARE_PROVIDER_SITE_OTHER): Payer: Commercial Managed Care - HMO | Admitting: Family Medicine

## 2015-01-30 VITALS — BP 136/74 | HR 76 | Temp 98.0°F | Ht 66.0 in | Wt 154.2 lb

## 2015-01-30 DIAGNOSIS — E538 Deficiency of other specified B group vitamins: Secondary | ICD-10-CM | POA: Diagnosis not present

## 2015-01-30 DIAGNOSIS — Z23 Encounter for immunization: Secondary | ICD-10-CM

## 2015-01-30 DIAGNOSIS — C50312 Malignant neoplasm of lower-inner quadrant of left female breast: Secondary | ICD-10-CM

## 2015-01-30 DIAGNOSIS — E785 Hyperlipidemia, unspecified: Secondary | ICD-10-CM | POA: Diagnosis not present

## 2015-01-30 DIAGNOSIS — Z0000 Encounter for general adult medical examination without abnormal findings: Secondary | ICD-10-CM

## 2015-01-30 LAB — CBC WITH DIFFERENTIAL/PLATELET
BASOS ABS: 0 10*3/uL (ref 0.0–0.1)
BASOS PCT: 0.4 % (ref 0.0–3.0)
EOS ABS: 0.1 10*3/uL (ref 0.0–0.7)
Eosinophils Relative: 0.6 % (ref 0.0–5.0)
HEMATOCRIT: 43 % (ref 36.0–46.0)
Hemoglobin: 14.5 g/dL (ref 12.0–15.0)
LYMPHS ABS: 2 10*3/uL (ref 0.7–4.0)
LYMPHS PCT: 19.8 % (ref 12.0–46.0)
MCHC: 33.7 g/dL (ref 30.0–36.0)
MCV: 91 fl (ref 78.0–100.0)
MONOS PCT: 6 % (ref 3.0–12.0)
Monocytes Absolute: 0.6 10*3/uL (ref 0.1–1.0)
NEUTROS ABS: 7.5 10*3/uL (ref 1.4–7.7)
NEUTROS PCT: 73.2 % (ref 43.0–77.0)
PLATELETS: 300 10*3/uL (ref 150.0–400.0)
RBC: 4.73 Mil/uL (ref 3.87–5.11)
RDW: 12.7 % (ref 11.5–15.5)
WBC: 10.2 10*3/uL (ref 4.0–10.5)

## 2015-01-30 LAB — POCT URINALYSIS DIPSTICK
Glucose, UA: NEGATIVE
Ketones, UA: NEGATIVE
LEUKOCYTES UA: NEGATIVE
NITRITE UA: NEGATIVE
PH UA: 6
PROTEIN UA: NEGATIVE
RBC UA: NEGATIVE
Spec Grav, UA: 1.025
UROBILINOGEN UA: 0.2

## 2015-01-30 LAB — LIPID PANEL
CHOL/HDL RATIO: 3
Cholesterol: 198 mg/dL (ref 0–200)
HDL: 66.1 mg/dL (ref >39.00)
LDL Cholesterol: 110 mg/dL — ABNORMAL HIGH (ref 0–99)
NONHDL: 131.74
Triglycerides: 109 mg/dL (ref 0.0–149.0)
VLDL: 21.8 mg/dL (ref 0.0–40.0)

## 2015-01-30 LAB — COMPREHENSIVE METABOLIC PANEL
ALT: 10 U/L (ref 0–35)
AST: 14 U/L (ref 0–37)
Albumin: 4.1 g/dL (ref 3.5–5.2)
Alkaline Phosphatase: 109 U/L (ref 39–117)
BILIRUBIN TOTAL: 0.4 mg/dL (ref 0.2–1.2)
BUN: 15 mg/dL (ref 6–23)
CALCIUM: 9.1 mg/dL (ref 8.4–10.5)
CHLORIDE: 101 meq/L (ref 96–112)
CO2: 28 meq/L (ref 19–32)
CREATININE: 0.68 mg/dL (ref 0.40–1.20)
GFR: 90.08 mL/min (ref >60.00)
GLUCOSE: 89 mg/dL (ref 70–99)
Potassium: 4 mEq/L (ref 3.5–5.1)
Sodium: 136 mEq/L (ref 135–145)
Total Protein: 7.3 g/dL (ref 6.0–8.3)

## 2015-01-30 LAB — VITAMIN B12: VITAMIN B 12: 991 pg/mL — AB (ref 211–911)

## 2015-01-30 MED ORDER — CYANOCOBALAMIN 1000 MCG/ML IJ SOLN: INTRAMUSCULAR | Status: DC

## 2015-01-30 NOTE — Patient Instructions (Signed)
Preventive Care for Adults A healthy lifestyle and preventive care can promote health and wellness. Preventive health guidelines for women include the following key practices.  A routine yearly physical is a good way to check with your health care provider about your health and preventive screening. It is a chance to share any concerns and updates on your health and to receive a thorough exam.  Visit your dentist for a routine exam and preventive care every 6 months. Brush your teeth twice a day and floss once a day. Good oral hygiene prevents tooth decay and gum disease.  The frequency of eye exams is based on your age, health, family medical history, use of contact lenses, and other factors. Follow your health care provider's recommendations for frequency of eye exams.  Eat a healthy diet. Foods like vegetables, fruits, whole grains, low-fat dairy products, and lean protein foods contain the nutrients you need without too many calories. Decrease your intake of foods high in solid fats, added sugars, and salt. Eat the right amount of calories for you.Get information about a proper diet from your health care provider, if necessary.  Regular physical exercise is one of the most important things you can do for your health. Most adults should get at least 150 minutes of moderate-intensity exercise (any activity that increases your heart rate and causes you to sweat) each week. In addition, most adults need muscle-strengthening exercises on 2 or more days a week.  Maintain a healthy weight. The body mass index (BMI) is a screening tool to identify possible weight problems. It provides an estimate of body fat based on height and weight. Your health care provider can find your BMI and can help you achieve or maintain a healthy weight.For adults 20 years and older:  A BMI below 18.5 is considered underweight.  A BMI of 18.5 to 24.9 is normal.  A BMI of 25 to 29.9 is considered overweight.  A BMI of  30 and above is considered obese.  Maintain normal blood lipids and cholesterol levels by exercising and minimizing your intake of saturated fat. Eat a balanced diet with plenty of fruit and vegetables. Blood tests for lipids and cholesterol should begin at age 76 and be repeated every 5 years. If your lipid or cholesterol levels are high, you are over 50, or you are at high risk for heart disease, you may need your cholesterol levels checked more frequently.Ongoing high lipid and cholesterol levels should be treated with medicines if diet and exercise are not working.  If you smoke, find out from your health care provider how to quit. If you do not use tobacco, do not start.  Lung cancer screening is recommended for adults aged 22-80 years who are at high risk for developing lung cancer because of a history of smoking. A yearly low-dose CT scan of the lungs is recommended for people who have at least a 30-pack-year history of smoking and are a current smoker or have quit within the past 15 years. A pack year of smoking is smoking an average of 1 pack of cigarettes a day for 1 year (for example: 1 pack a day for 30 years or 2 packs a day for 15 years). Yearly screening should continue until the smoker has stopped smoking for at least 15 years. Yearly screening should be stopped for people who develop a health problem that would prevent them from having lung cancer treatment.  If you are pregnant, do not drink alcohol. If you are breastfeeding,  be very cautious about drinking alcohol. If you are not pregnant and choose to drink alcohol, do not have more than 1 drink per day. One drink is considered to be 12 ounces (355 mL) of beer, 5 ounces (148 mL) of wine, or 1.5 ounces (44 mL) of liquor.  Avoid use of street drugs. Do not share needles with anyone. Ask for help if you need support or instructions about stopping the use of drugs.  High blood pressure causes heart disease and increases the risk of  stroke. Your blood pressure should be checked at least every 1 to 2 years. Ongoing high blood pressure should be treated with medicines if weight loss and exercise do not work.  If you are 3-86 years old, ask your health care provider if you should take aspirin to prevent strokes.  Diabetes screening involves taking a blood sample to check your fasting blood sugar level. This should be done once every 3 years, after age 67, if you are within normal weight and without risk factors for diabetes. Testing should be considered at a younger age or be carried out more frequently if you are overweight and have at least 1 risk factor for diabetes.  Breast cancer screening is essential preventive care for women. You should practice "breast self-awareness." This means understanding the normal appearance and feel of your breasts and may include breast self-examination. Any changes detected, no matter how small, should be reported to a health care provider. Women in their 8s and 30s should have a clinical breast exam (CBE) by a health care provider as part of a regular health exam every 1 to 3 years. After age 70, women should have a CBE every year. Starting at age 25, women should consider having a mammogram (breast X-ray test) every year. Women who have a family history of breast cancer should talk to their health care provider about genetic screening. Women at a high risk of breast cancer should talk to their health care providers about having an MRI and a mammogram every year.  Breast cancer gene (BRCA)-related cancer risk assessment is recommended for women who have family members with BRCA-related cancers. BRCA-related cancers include breast, ovarian, tubal, and peritoneal cancers. Having family members with these cancers may be associated with an increased risk for harmful changes (mutations) in the breast cancer genes BRCA1 and BRCA2. Results of the assessment will determine the need for genetic counseling and  BRCA1 and BRCA2 testing.  Routine pelvic exams to screen for cancer are no longer recommended for nonpregnant women who are considered low risk for cancer of the pelvic organs (ovaries, uterus, and vagina) and who do not have symptoms. Ask your health care provider if a screening pelvic exam is right for you.  If you have had past treatment for cervical cancer or a condition that could lead to cancer, you need Pap tests and screening for cancer for at least 20 years after your treatment. If Pap tests have been discontinued, your risk factors (such as having a new sexual partner) need to be reassessed to determine if screening should be resumed. Some women have medical problems that increase the chance of getting cervical cancer. In these cases, your health care provider may recommend more frequent screening and Pap tests.  The HPV test is an additional test that may be used for cervical cancer screening. The HPV test looks for the virus that can cause the cell changes on the cervix. The cells collected during the Pap test can be  tested for HPV. The HPV test could be used to screen women aged 30 years and older, and should be used in women of any age who have unclear Pap test results. After the age of 30, women should have HPV testing at the same frequency as a Pap test.  Colorectal cancer can be detected and often prevented. Most routine colorectal cancer screening begins at the age of 50 years and continues through age 75 years. However, your health care provider may recommend screening at an earlier age if you have risk factors for colon cancer. On a yearly basis, your health care provider may provide home test kits to check for hidden blood in the stool. Use of a small camera at the end of a tube, to directly examine the colon (sigmoidoscopy or colonoscopy), can detect the earliest forms of colorectal cancer. Talk to your health care provider about this at age 50, when routine screening begins. Direct  exam of the colon should be repeated every 5-10 years through age 75 years, unless early forms of pre-cancerous polyps or small growths are found.  People who are at an increased risk for hepatitis B should be screened for this virus. You are considered at high risk for hepatitis B if:  You were born in a country where hepatitis B occurs often. Talk with your health care provider about which countries are considered high risk.  Your parents were born in a high-risk country and you have not received a shot to protect against hepatitis B (hepatitis B vaccine).  You have HIV or AIDS.  You use needles to inject street drugs.  You live with, or have sex with, someone who has hepatitis B.  You get hemodialysis treatment.  You take certain medicines for conditions like cancer, organ transplantation, and autoimmune conditions.  Hepatitis C blood testing is recommended for all people born from 1945 through 1965 and any individual with known risks for hepatitis C.  Practice safe sex. Use condoms and avoid high-risk sexual practices to reduce the spread of sexually transmitted infections (STIs). STIs include gonorrhea, chlamydia, syphilis, trichomonas, herpes, HPV, and human immunodeficiency virus (HIV). Herpes, HIV, and HPV are viral illnesses that have no cure. They can result in disability, cancer, and death.  You should be screened for sexually transmitted illnesses (STIs) including gonorrhea and chlamydia if:  You are sexually active and are younger than 24 years.  You are older than 24 years and your health care provider tells you that you are at risk for this type of infection.  Your sexual activity has changed since you were last screened and you are at an increased risk for chlamydia or gonorrhea. Ask your health care provider if you are at risk.  If you are at risk of being infected with HIV, it is recommended that you take a prescription medicine daily to prevent HIV infection. This is  called preexposure prophylaxis (PrEP). You are considered at risk if:  You are a heterosexual woman, are sexually active, and are at increased risk for HIV infection.  You take drugs by injection.  You are sexually active with a partner who has HIV.  Talk with your health care provider about whether you are at high risk of being infected with HIV. If you choose to begin PrEP, you should first be tested for HIV. You should then be tested every 3 months for as long as you are taking PrEP.  Osteoporosis is a disease in which the bones lose minerals and strength   with aging. This can result in serious bone fractures or breaks. The risk of osteoporosis can be identified using a bone density scan. Women ages 65 years and over and women at risk for fractures or osteoporosis should discuss screening with their health care providers. Ask your health care provider whether you should take a calcium supplement or vitamin D to reduce the rate of osteoporosis.  Menopause can be associated with physical symptoms and risks. Hormone replacement therapy is available to decrease symptoms and risks. You should talk to your health care provider about whether hormone replacement therapy is right for you.  Use sunscreen. Apply sunscreen liberally and repeatedly throughout the day. You should seek shade when your shadow is shorter than you. Protect yourself by wearing long sleeves, pants, a wide-brimmed hat, and sunglasses year round, whenever you are outdoors.  Once a month, do a whole body skin exam, using a mirror to look at the skin on your back. Tell your health care provider of new moles, moles that have irregular borders, moles that are larger than a pencil eraser, or moles that have changed in shape or color.  Stay current with required vaccines (immunizations).  Influenza vaccine. All adults should be immunized every year.  Tetanus, diphtheria, and acellular pertussis (Td, Tdap) vaccine. Pregnant women should  receive 1 dose of Tdap vaccine during each pregnancy. The dose should be obtained regardless of the length of time since the last dose. Immunization is preferred during the 27th-36th week of gestation. An adult who has not previously received Tdap or who does not know her vaccine status should receive 1 dose of Tdap. This initial dose should be followed by tetanus and diphtheria toxoids (Td) booster doses every 10 years. Adults with an unknown or incomplete history of completing a 3-dose immunization series with Td-containing vaccines should begin or complete a primary immunization series including a Tdap dose. Adults should receive a Td booster every 10 years.  Varicella vaccine. An adult without evidence of immunity to varicella should receive 2 doses or a second dose if she has previously received 1 dose. Pregnant females who do not have evidence of immunity should receive the first dose after pregnancy. This first dose should be obtained before leaving the health care facility. The second dose should be obtained 4-8 weeks after the first dose.  Human papillomavirus (HPV) vaccine. Females aged 13-26 years who have not received the vaccine previously should obtain the 3-dose series. The vaccine is not recommended for use in pregnant females. However, pregnancy testing is not needed before receiving a dose. If a female is found to be pregnant after receiving a dose, no treatment is needed. In that case, the remaining doses should be delayed until after the pregnancy. Immunization is recommended for any person with an immunocompromised condition through the age of 26 years if she did not get any or all doses earlier. During the 3-dose series, the second dose should be obtained 4-8 weeks after the first dose. The third dose should be obtained 24 weeks after the first dose and 16 weeks after the second dose.  Zoster vaccine. One dose is recommended for adults aged 60 years or older unless certain conditions are  present.  Measles, mumps, and rubella (MMR) vaccine. Adults born before 1957 generally are considered immune to measles and mumps. Adults born in 1957 or later should have 1 or more doses of MMR vaccine unless there is a contraindication to the vaccine or there is laboratory evidence of immunity to   each of the three diseases. A routine second dose of MMR vaccine should be obtained at least 28 days after the first dose for students attending postsecondary schools, health care workers, or international travelers. People who received inactivated measles vaccine or an unknown type of measles vaccine during 1963-1967 should receive 2 doses of MMR vaccine. People who received inactivated mumps vaccine or an unknown type of mumps vaccine before 1979 and are at high risk for mumps infection should consider immunization with 2 doses of MMR vaccine. For females of childbearing age, rubella immunity should be determined. If there is no evidence of immunity, females who are not pregnant should be vaccinated. If there is no evidence of immunity, females who are pregnant should delay immunization until after pregnancy. Unvaccinated health care workers born before 1957 who lack laboratory evidence of measles, mumps, or rubella immunity or laboratory confirmation of disease should consider measles and mumps immunization with 2 doses of MMR vaccine or rubella immunization with 1 dose of MMR vaccine.  Pneumococcal 13-valent conjugate (PCV13) vaccine. When indicated, a person who is uncertain of her immunization history and has no record of immunization should receive the PCV13 vaccine. An adult aged 19 years or older who has certain medical conditions and has not been previously immunized should receive 1 dose of PCV13 vaccine. This PCV13 should be followed with a dose of pneumococcal polysaccharide (PPSV23) vaccine. The PPSV23 vaccine dose should be obtained at least 8 weeks after the dose of PCV13 vaccine. An adult aged 19  years or older who has certain medical conditions and previously received 1 or more doses of PPSV23 vaccine should receive 1 dose of PCV13. The PCV13 vaccine dose should be obtained 1 or more years after the last PPSV23 vaccine dose.  Pneumococcal polysaccharide (PPSV23) vaccine. When PCV13 is also indicated, PCV13 should be obtained first. All adults aged 65 years and older should be immunized. An adult younger than age 65 years who has certain medical conditions should be immunized. Any person who resides in a nursing home or long-term care facility should be immunized. An adult smoker should be immunized. People with an immunocompromised condition and certain other conditions should receive both PCV13 and PPSV23 vaccines. People with human immunodeficiency virus (HIV) infection should be immunized as soon as possible after diagnosis. Immunization during chemotherapy or radiation therapy should be avoided. Routine use of PPSV23 vaccine is not recommended for American Indians, [place] Natives, or people younger than 65 years unless there are medical conditions that require PPSV23 vaccine. When indicated, people who have unknown immunization and have no record of immunization should receive PPSV23 vaccine. One-time revaccination 5 years after the first dose of PPSV23 is recommended for people aged 19-64 years who have chronic kidney failure, nephrotic syndrome, asplenia, or immunocompromised conditions. People who received 1-2 doses of PPSV23 before age 65 years should receive another dose of PPSV23 vaccine at age 65 years or later if at least 5 years have passed since the previous dose. Doses of PPSV23 are not needed for people immunized with PPSV23 at or after age 65 years.  Meningococcal vaccine. Adults with asplenia or persistent complement component deficiencies should receive 2 doses of quadrivalent meningococcal conjugate (MenACWY-D) vaccine. The doses should be obtained at least 2 months apart.  Microbiologists working with certain meningococcal bacteria, military recruits, people at risk during an outbreak, and people who travel to or live in countries with a high rate of meningitis should be immunized. A first-year college student up through age   21 years who is living in a residence hall should receive a dose if she did not receive a dose on or after her 16th birthday. Adults who have certain high-risk conditions should receive one or more doses of vaccine.  Hepatitis A vaccine. Adults who wish to be protected from this disease, have certain high-risk conditions, work with hepatitis A-infected animals, work in hepatitis A research labs, or travel to or work in countries with a high rate of hepatitis A should be immunized. Adults who were previously unvaccinated and who anticipate close contact with an international adoptee during the first 60 days after arrival in the [place] from a country with a high rate of hepatitis A should be immunized.  Hepatitis B vaccine. Adults who wish to be protected from this disease, have certain high-risk conditions, may be exposed to blood or other infectious body fluids, are household contacts or sex partners of hepatitis B positive people, are clients or workers in certain care facilities, or travel to or work in countries with a high rate of hepatitis B should be immunized.  Haemophilus influenzae type b (Hib) vaccine. A previously unvaccinated person with asplenia or sickle cell disease or having a scheduled splenectomy should receive 1 dose of Hib vaccine. Regardless of previous immunization, a recipient of a hematopoietic stem cell transplant should receive a 3-dose series 6-12 months after her successful transplant. Hib vaccine is not recommended for adults with HIV infection. Preventive Services / Frequency Ages 64 to 68 years  Blood pressure check.** / Every 1 to 2 years.  Lipid and cholesterol check.** / Every 5 years beginning at age  22.  Clinical breast exam.** / Every 3 years for women in their 88s and 53s.  BRCA-related cancer risk assessment.** / For women who have family members with a BRCA-related cancer (breast, ovarian, tubal, or peritoneal cancers).  Pap test.** / Every 2 years from ages 90 through 51. Every 3 years starting at age 21 through age 56 or 3 with a history of 3 consecutive normal Pap tests.  HPV screening.** / Every 3 years from ages 24 through ages 1 to 46 with a history of 3 consecutive normal Pap tests.  Hepatitis C blood test.** / For any individual with known risks for hepatitis C.  Skin self-exam. / Monthly.  Influenza vaccine. / Every year.  Tetanus, diphtheria, and acellular pertussis (Tdap, Td) vaccine.** / Consult your health care provider. Pregnant women should receive 1 dose of Tdap vaccine during each pregnancy. 1 dose of Td every 10 years.  Varicella vaccine.** / Consult your health care provider. Pregnant females who do not have evidence of immunity should receive the first dose after pregnancy.  HPV vaccine. / 3 doses over 6 months, if 72 and younger. The vaccine is not recommended for use in pregnant females. However, pregnancy testing is not needed before receiving a dose.  Measles, mumps, rubella (MMR) vaccine.** / You need at least 1 dose of MMR if you were born in 1957 or later. You may also need a 2nd dose. For females of childbearing age, rubella immunity should be determined. If there is no evidence of immunity, females who are not pregnant should be vaccinated. If there is no evidence of immunity, females who are pregnant should delay immunization until after pregnancy.  Pneumococcal 13-valent conjugate (PCV13) vaccine.** / Consult your health care provider.  Pneumococcal polysaccharide (PPSV23) vaccine.** / 1 to 2 doses if you smoke cigarettes or if you have certain conditions.  Meningococcal vaccine.** /  1 dose if you are age 19 to 21 years and a first-year college  student living in a residence hall, or have one of several medical conditions, you need to get vaccinated against meningococcal disease. You may also need additional booster doses.  Hepatitis A vaccine.** / Consult your health care provider.  Hepatitis B vaccine.** / Consult your health care provider.  Haemophilus influenzae type b (Hib) vaccine.** / Consult your health care provider. Ages 40 to 64 years  Blood pressure check.** / Every 1 to 2 years.  Lipid and cholesterol check.** / Every 5 years beginning at age 20 years.  Lung cancer screening. / Every year if you are aged 55-80 years and have a 30-pack-year history of smoking and currently smoke or have quit within the past 15 years. Yearly screening is stopped once you have quit smoking for at least 15 years or develop a health problem that would prevent you from having lung cancer treatment.  Clinical breast exam.** / Every year after age 40 years.  BRCA-related cancer risk assessment.** / For women who have family members with a BRCA-related cancer (breast, ovarian, tubal, or peritoneal cancers).  Mammogram.** / Every year beginning at age 40 years and continuing for as long as you are in good health. Consult with your health care provider.  Pap test.** / Every 3 years starting at age 30 years through age 65 or 70 years with a history of 3 consecutive normal Pap tests.  HPV screening.** / Every 3 years from ages 30 years through ages 65 to 70 years with a history of 3 consecutive normal Pap tests.  Fecal occult blood test (FOBT) of stool. / Every year beginning at age 50 years and continuing until age 75 years. You may not need to do this test if you get a colonoscopy every 10 years.  Flexible sigmoidoscopy or colonoscopy.** / Every 5 years for a flexible sigmoidoscopy or every 10 years for a colonoscopy beginning at age 50 years and continuing until age 75 years.  Hepatitis C blood test.** / For all people born from 1945 through  1965 and any individual with known risks for hepatitis C.  Skin self-exam. / Monthly.  Influenza vaccine. / Every year.  Tetanus, diphtheria, and acellular pertussis (Tdap/Td) vaccine.** / Consult your health care provider. Pregnant women should receive 1 dose of Tdap vaccine during each pregnancy. 1 dose of Td every 10 years.  Varicella vaccine.** / Consult your health care provider. Pregnant females who do not have evidence of immunity should receive the first dose after pregnancy.  Zoster vaccine.** / 1 dose for adults aged 60 years or older.  Measles, mumps, rubella (MMR) vaccine.** / You need at least 1 dose of MMR if you were born in 1957 or later. You may also need a 2nd dose. For females of childbearing age, rubella immunity should be determined. If there is no evidence of immunity, females who are not pregnant should be vaccinated. If there is no evidence of immunity, females who are pregnant should delay immunization until after pregnancy.  Pneumococcal 13-valent conjugate (PCV13) vaccine.** / Consult your health care provider.  Pneumococcal polysaccharide (PPSV23) vaccine.** / 1 to 2 doses if you smoke cigarettes or if you have certain conditions.  Meningococcal vaccine.** / Consult your health care provider.  Hepatitis A vaccine.** / Consult your health care provider.  Hepatitis B vaccine.** / Consult your health care provider.  Haemophilus influenzae type b (Hib) vaccine.** / Consult your health care provider. Ages 65   years and over  Blood pressure check.** / Every 1 to 2 years.  Lipid and cholesterol check.** / Every 5 years beginning at age 22 years.  Lung cancer screening. / Every year if you are aged 73-80 years and have a 30-pack-year history of smoking and currently smoke or have quit within the past 15 years. Yearly screening is stopped once you have quit smoking for at least 15 years or develop a health problem that would prevent you from having lung cancer  treatment.  Clinical breast exam.** / Every year after age 4 years.  BRCA-related cancer risk assessment.** / For women who have family members with a BRCA-related cancer (breast, ovarian, tubal, or peritoneal cancers).  Mammogram.** / Every year beginning at age 40 years and continuing for as long as you are in good health. Consult with your health care provider.  Pap test.** / Every 3 years starting at age 9 years through age 34 or 91 years with 3 consecutive normal Pap tests. Testing can be stopped between 65 and 70 years with 3 consecutive normal Pap tests and no abnormal Pap or HPV tests in the past 10 years.  HPV screening.** / Every 3 years from ages 57 years through ages 64 or 45 years with a history of 3 consecutive normal Pap tests. Testing can be stopped between 65 and 70 years with 3 consecutive normal Pap tests and no abnormal Pap or HPV tests in the past 10 years.  Fecal occult blood test (FOBT) of stool. / Every year beginning at age 15 years and continuing until age 17 years. You may not need to do this test if you get a colonoscopy every 10 years.  Flexible sigmoidoscopy or colonoscopy.** / Every 5 years for a flexible sigmoidoscopy or every 10 years for a colonoscopy beginning at age 86 years and continuing until age 71 years.  Hepatitis C blood test.** / For all people born from [birth year] through 1964 and any individual with known risks for hepatitis C.  Osteoporosis screening.** / A one-time screening for women ages 83 years and over and women at risk for fractures or osteoporosis.  Skin self-exam. / Monthly.  Influenza vaccine. / Every year.  Tetanus, diphtheria, and acellular pertussis (Tdap/Td) vaccine.** / 1 dose of Td every 10 years.  Varicella vaccine.** / Consult your health care provider.  Zoster vaccine.** / 1 dose for adults aged 61 years or older.  Pneumococcal 13-valent conjugate (PCV13) vaccine.** / Consult your health care provider.  Pneumococcal  polysaccharide (PPSV23) vaccine.** / 1 dose for all adults aged 28 years and older.  Meningococcal vaccine.** / Consult your health care provider.  Hepatitis A vaccine.** / Consult your health care provider.  Hepatitis B vaccine.** / Consult your health care provider.  Haemophilus influenzae type b (Hib) vaccine.** / Consult your health care provider. ** Family history and personal history of risk and conditions may change your health care provider's recommendations. Document Released: 06/10/2001 Document Revised: 08/29/2013 Document Reviewed: 09/09/2010 [facility] Patient Information 2015 [place], [place]. This information is not intended to replace advice given to you by your health care provider. Make sure you discuss any questions you have with your health care provider.

## 2015-01-30 NOTE — Assessment & Plan Note (Signed)
Per oncology

## 2015-01-30 NOTE — Progress Notes (Signed)
Subjective:   Sara Barnes is a 74 y.o. female who presents for Medicare Annual (Subsequent) preventive examination.  Review of Systems:   Review of Systems  Constitutional: Negative for activity change, appetite change and fatigue.  HENT: Negative for hearing loss, congestion, tinnitus and ear discharge.   Eyes: Negative for visual disturbance (see optho q1y -- vision corrected to 20/20 with glasses).  Respiratory: Negative for cough, chest tightness and shortness of breath.   Cardiovascular: Negative for chest pain, palpitations and leg swelling.  Gastrointestinal: Negative for abdominal pain, diarrhea, constipation and abdominal distention.  Genitourinary: Negative for urgency, frequency, decreased urine volume and difficulty urinating.  Musculoskeletal: Negative for back pain, arthralgias and gait problem.  Skin: Negative for color change, pallor and rash.  Neurological: Negative for dizziness, light-headedness, numbness and headaches.  Hematological: Negative for adenopathy. Does not bruise/bleed easily.  Psychiatric/Behavioral: Negative for suicidal ideas, confusion, sleep disturbance, self-injury, dysphoric mood, decreased concentration and agitation.  Pt is able to read and write and can do all ADLs No risk for falling No abuse/ violence in home           Objective:     Vitals: BP 136/74 mmHg  Pulse 76  Temp(Src) 98 F (36.7 C) (Oral)  Ht 5' 6" (1.676 m)  Wt 154 lb 3.2 oz (69.945 kg)  BMI 24.90 kg/m2  SpO2 98% BP 136/74 mmHg  Pulse 76  Temp(Src) 98 F (36.7 C) (Oral)  Ht 5' 6" (1.676 m)  Wt 154 lb 3.2 oz (69.945 kg)  BMI 24.90 kg/m2  SpO2 98% General appearance: alert, cooperative, appears stated age and no distress Head: Normocephalic, without obvious abnormality, atraumatic Eyes: conjunctivae/corneas clear. PERRL, EOM's intact. Fundi benign. Ears: normal TM's and external ear canals both ears Nose: Nares normal. Septum midline. Mucosa normal. No  drainage or sinus tenderness. Throat: lips, mucosa, and tongue normal; teeth and gums normal Neck: no adenopathy, no carotid bruit, no JVD, supple, symmetrical, trachea midline and thyroid not enlarged, symmetric, no tenderness/mass/nodules Back: symmetric, no curvature. ROM normal. No CVA tenderness. Lungs: clear to auscultation bilaterally Breasts: pt very bruised from biopsy and lumpectomy Heart: regular rate and rhythm, S1, S2 normal, no murmur, click, rub or gallop Abdomen: soft, non-tender; bowel sounds normal; no masses,  no organomegaly Pelvic: deferred Extremities: extremities normal, atraumatic, no cyanosis or edema Pulses: 2+ and symmetric Skin: Skin color, texture, turgor normal. No rashes or lesions Lymph nodes: Cervical, supraclavicular, and axillary nodes normal. Neurologic: Alert and oriented X 3, normal strength and tone. Normal symmetric reflexes. Normal coordination and gait Psych- no depression, no anxiety Tobacco History  Smoking status  . Never Smoker   Smokeless tobacco  . Never Used     Counseling given: Not Answered   Past Medical History  Diagnosis Date  . Vitamin B12 deficiency   . Ulcerative colitis   . Indigestion   . 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 Complication of anesthesia     anxiety post-op after ear surgeries  . PONV (postoperative nausea and vomiting)   . Family history of adverse reaction to anesthesia     pt's sister has hx. of post-op N/V  . Dental crowns present     recent root canal 11/2014  . Runny nose 12/07/2014    clear drainage, per pt.  . Seasonal allergies    Past Surgical History  Procedure Laterality Date  . Abdominal hysterectomy  partial  . Appendectomy    . Breast enhancement surgery    . Incision and drainage perirectal abscess N/A 09/28/2013    Procedure: IRRIGATION AND DEBRIDEMENT PERIANAL ABSCESS, proctoscopy;  Surgeon: Shann Medal, MD;  Location: [facility];  Service:  General;  Laterality: N/A;  . Breast lumpectomy Left     age 63's  . Inner ear surgery Bilateral   . Colonoscopy with propofol  05/28/2011; 06/28/2014  . Breast lumpectomy with radioactive seed and sentinel lymph node biopsy Left 12/13/2014    Procedure: LEFT BREAST LUMPECTOMY WITH RADIOACTIVE SEED AND SENTINEL LYMPH NODE BIOPSY;  Surgeon: [name], MD;  Location: [facility];  Service: General;  Laterality: Left;   Family History  Problem Relation Age of Onset  . Anesthesia problems Sister     post-op N/V  . Prostate cancer Brother 71  . Kidney disease Brother   . Diabetes Brother   . Emphysema Maternal Grandfather   . Asthma Sister   . Brain cancer Father   . Rheum arthritis Mother   . Rheum arthritis Brother    History  Sexual Activity  . Sexual Activity:  . Partners: Male    Outpatient Encounter Prescriptions as of 01/30/2015  Medication Sig  . acetaminophen (TYLENOL) 325 MG tablet Take 650 mg by mouth every 6 (six) hours as needed.  . cyanocobalamin (,VITAMIN B-12,) 1000 MCG/ML injection INJECT 1 ML (1,000 MCG TOTAL) INTO THE MUSCLE EVERY 30 (THIRTY) DAYS.  . famotidine (PEPCID) 20 MG tablet Take 20 mg by mouth 2 (two) times daily.  Marland Kitchen FLUoxetine (PROZAC) 20 MG capsule TAKE ONE CAPSULE BY MOUTH ONCE DAILY  . ibuprofen (ADVIL,MOTRIN) 200 MG tablet Take 400 mg by mouth every 6 (six) hours as needed for mild pain (pain).   . [DISCONTINUED] cyanocobalamin (,VITAMIN B-12,) 1000 MCG/ML injection INJECT 1 ML (1,000 MCG TOTAL) INTO THE MUSCLE EVERY 30 (THIRTY) DAYS.  . [DISCONTINUED] levofloxacin (LEVAQUIN) 500 MG tablet Take 1 tablet (500 mg total) by mouth daily.   No facility-administered encounter medications on file as of 01/30/2015.    Activities of Daily Living In your present state of health, do you have any difficulty performing the following activities: 01/30/2015 12/13/2014  Hearing? Y N  Vision? N N  Difficulty concentrating or making decisions? N N    Walking or climbing stairs? N N  Dressing or bathing? N N  Doing errands, shopping? N -    Patient Care Team: Rosalita Chessman, DO as PCP - General [place] Dragon, MD as Consulting Physician (Gastroenterology) [name], MD as Consulting Physician (General Surgery) Truitt Merle, MD as Consulting Physician (Hematology) Thea Silversmith, MD as Consulting Physician (Radiation Oncology) Mauro Kaufmann, RN as Registered Nurse Rockwell Germany, RN as Registered Nurse Holley Bouche, NP as Nurse Practitioner (Nurse Practitioner) Sylvan Cheese, NP as Nurse Practitioner (Nurse Practitioner)    Assessment:    cpe Exercise Activities and Dietary recommendations---no-- pt going through cancer treatments right now    Goals    None     Fall Risk Fall Risk  01/30/2015 01/30/2015 01/26/2014 01/03/2013  Falls in the past year? No No No No   Depression Screen PHQ 2/9 Scores 01/30/2015 01/30/2015 01/26/2014 01/03/2013  PHQ - 2 Score 0 0 0 0     Cognitive Testing mmse 30/30  Immunization History  Administered Date(s) Administered  . Influenza,inj,Quad PF,36+ Mos 01/26/2014  . Pneumococcal Conjugate-13 01/26/2014  . Pneumococcal Polysaccharide-23 12/10/2007  . Td 06/04/2004,  08/12/2006  . Zoster 03/01/2012   Screening Tests Health Maintenance  Topic Date Due  . INFLUENZA VACCINE  03/02/2015 (Originally 11/27/2014)  . MAMMOGRAM  11/28/2015  . COLONOSCOPY  06/27/2016  . TETANUS/TDAP  08/11/2016  . DEXA SCAN  Completed  . ZOSTAVAX  Completed  . PNA vac Low Risk Adult  Completed      Plan:   see AVS During the course of the visit the patient was educated and counseled about the following appropriate screening and preventive services:   Vaccines to include Pneumoccal, Influenza, Hepatitis B, Td, Zostavax, HCV  Electrocardiogram  Cardiovascular Disease  Colorectal cancer screening  Bone density screening  Diabetes screening  Glaucoma  screening  Mammography/PAP  Nutrition counseling   Patient Instructions (the written plan) was given to the patient.  1. Vitamin B12 deficiency  - cyanocobalamin (,VITAMIN B-12,) 1000 MCG/ML injection; INJECT 1 ML (1,000 MCG TOTAL) INTO THE MUSCLE EVERY 30 (THIRTY) DAYS.  Dispense: 3 mL; Refill: 1 - Vitamin B12  2. Hyperlipidemia On no meds -- check labs - Comp Met (CMET) - CBC with Differential/Platelet - Lipid panel - POCT urinalysis dipstick - Microalbumin / creatinine urine ratio  [name], DO  01/30/2015

## 2015-01-30 NOTE — Progress Notes (Signed)
Pre visit review using our clinic review tool, if applicable. No additional management support is needed unless otherwise documented below in the visit note. 

## 2015-01-31 DIAGNOSIS — C50312 Malignant neoplasm of lower-inner quadrant of left female breast: Secondary | ICD-10-CM | POA: Diagnosis not present

## 2015-01-31 LAB — MICROALBUMIN / CREATININE URINE RATIO
Creatinine,U: 126 mg/dL
MICROALB/CREAT RATIO: 0.6 mg/g (ref 0.0–30.0)
Microalb, Ur: <0.7 mg/dL (ref 0.0–1.9)

## 2015-02-01 DIAGNOSIS — C50312 Malignant neoplasm of lower-inner quadrant of left female breast: Secondary | ICD-10-CM | POA: Diagnosis not present

## 2015-02-01 DIAGNOSIS — Z510 Encounter for antineoplastic radiation therapy: Secondary | ICD-10-CM | POA: Diagnosis not present

## 2015-02-01 DIAGNOSIS — Z170 Estrogen receptor positive status [ER+]: Secondary | ICD-10-CM | POA: Diagnosis not present

## 2015-02-02 ENCOUNTER — Ambulatory Visit
Admission: RE | Admit: 2015-02-02 | Discharge: 2015-02-02 | Disposition: A | Discharge status: Home / Self Care | Payer: Commercial Managed Care - HMO | Source: Ambulatory Visit | Attending: Radiation Oncology | Admitting: Radiation Oncology

## 2015-02-02 DIAGNOSIS — Z510 Encounter for antineoplastic radiation therapy: Secondary | ICD-10-CM | POA: Diagnosis not present

## 2015-02-02 DIAGNOSIS — C50312 Malignant neoplasm of lower-inner quadrant of left female breast: Secondary | ICD-10-CM | POA: Diagnosis not present

## 2015-02-02 DIAGNOSIS — Z170 Estrogen receptor positive status [ER+]: Secondary | ICD-10-CM | POA: Diagnosis not present

## 2015-02-05 ENCOUNTER — Ambulatory Visit
Admission: RE | Admit: 2015-02-05 | Discharge: 2015-02-05 | Disposition: A | Discharge status: Home / Self Care | Payer: Commercial Managed Care - HMO | Source: Ambulatory Visit | Attending: Radiation Oncology | Admitting: Radiation Oncology

## 2015-02-05 DIAGNOSIS — C50312 Malignant neoplasm of lower-inner quadrant of left female breast: Secondary | ICD-10-CM | POA: Diagnosis not present

## 2015-02-05 DIAGNOSIS — Z170 Estrogen receptor positive status [ER+]: Secondary | ICD-10-CM | POA: Diagnosis not present

## 2015-02-05 DIAGNOSIS — Z510 Encounter for antineoplastic radiation therapy: Secondary | ICD-10-CM | POA: Diagnosis not present

## 2015-02-06 ENCOUNTER — Ambulatory Visit
Admission: RE | Admit: 2015-02-06 | Discharge: 2015-02-06 | Disposition: A | Discharge status: Home / Self Care | Payer: Commercial Managed Care - HMO | Source: Ambulatory Visit | Attending: Radiation Oncology | Admitting: Radiation Oncology

## 2015-02-06 VITALS — BP 133/79 | HR 74 | Temp 98.3°F | Wt 155.1 lb

## 2015-02-06 DIAGNOSIS — Z170 Estrogen receptor positive status [ER+]: Secondary | ICD-10-CM | POA: Diagnosis not present

## 2015-02-06 DIAGNOSIS — C50312 Malignant neoplasm of lower-inner quadrant of left female breast: Secondary | ICD-10-CM

## 2015-02-06 DIAGNOSIS — Z510 Encounter for antineoplastic radiation therapy: Secondary | ICD-10-CM | POA: Diagnosis not present

## 2015-02-06 MED ORDER — ALRA NON-METALLIC DEODORANT (RAD-ONC)
1.0000 "application " | Freq: Once | TOPICAL | Status: AC
  Administered 2015-02-06: 1 via TOPICAL

## 2015-02-06 MED ORDER — RADIAPLEXRX EX GEL
Freq: Once | CUTANEOUS | Status: AC
  Administered 2015-02-06: 11:00:00 via TOPICAL

## 2015-02-06 NOTE — Progress Notes (Signed)
BP 133/79 mmHg  Pulse 74  Temp(Src) 98.3 F (36.8 C)  Wt 155 lb 1.6 oz (70.353 kg)  Pt here for patient teaching.  Pt given Radiation and You booklet, skin care instructions, Alra deodorant and Radiaplex gel. Pt reports they have not watched the Radiation Therapy Education video on February 06, 2015.  Reviewed areas of pertinence such as  . Pt able to give teach back of to pat skin, use unscented/gentle soap and drink plenty of water,apply Radiaplex bid, avoid applying anything to skin within 4 hours of treatment, avoid wearing an under wire bra and to use an electric razor if they must shave. Pt demonstrated understanding and refused teaching of information given and will contact nursing with any questions or concerns.  Completed 2 of 16 treatments.teachback to be performed 02/07/15.

## 2015-02-06 NOTE — Progress Notes (Signed)
Weekly Management Note Current Dose: 5.34  Gy  Projected Dose:42.72  Gy   Narrative:  The patient presents for routine under treatment assessment.  CBCT/MVCT images/Port film x-rays were reviewed.  The chart was checked. RN education performed.   Physical Findings: Weight: 155 lb 1.6 oz (70.353 kg). Unchanged. Left breast slightly swollen.   Impression:  The patient is tolerating radiation.  Plan:  Continue treatment as planned. Start radiaplex.

## 2015-02-07 ENCOUNTER — Ambulatory Visit
Admission: RE | Admit: 2015-02-07 | Discharge: 2015-02-07 | Disposition: A | Discharge status: Home / Self Care | Payer: Commercial Managed Care - HMO | Source: Ambulatory Visit | Attending: Radiation Oncology | Admitting: Radiation Oncology

## 2015-02-07 DIAGNOSIS — Z510 Encounter for antineoplastic radiation therapy: Secondary | ICD-10-CM | POA: Diagnosis not present

## 2015-02-07 DIAGNOSIS — Z170 Estrogen receptor positive status [ER+]: Secondary | ICD-10-CM | POA: Diagnosis not present

## 2015-02-07 DIAGNOSIS — C50312 Malignant neoplasm of lower-inner quadrant of left female breast: Secondary | ICD-10-CM | POA: Diagnosis not present

## 2015-02-08 ENCOUNTER — Ambulatory Visit
Admission: RE | Admit: 2015-02-08 | Discharge: 2015-02-08 | Disposition: A | Discharge status: Home / Self Care | Payer: Commercial Managed Care - HMO | Source: Ambulatory Visit | Attending: Radiation Oncology | Admitting: Radiation Oncology

## 2015-02-08 DIAGNOSIS — Z510 Encounter for antineoplastic radiation therapy: Secondary | ICD-10-CM | POA: Diagnosis not present

## 2015-02-08 DIAGNOSIS — C50312 Malignant neoplasm of lower-inner quadrant of left female breast: Secondary | ICD-10-CM | POA: Diagnosis not present

## 2015-02-08 DIAGNOSIS — Z170 Estrogen receptor positive status [ER+]: Secondary | ICD-10-CM | POA: Diagnosis not present

## 2015-02-09 ENCOUNTER — Ambulatory Visit
Admission: RE | Admit: 2015-02-09 | Discharge: 2015-02-09 | Disposition: A | Discharge status: Home / Self Care | Payer: Commercial Managed Care - HMO | Source: Ambulatory Visit | Attending: Radiation Oncology | Admitting: Radiation Oncology

## 2015-02-09 DIAGNOSIS — C50312 Malignant neoplasm of lower-inner quadrant of left female breast: Secondary | ICD-10-CM | POA: Diagnosis not present

## 2015-02-09 DIAGNOSIS — Z510 Encounter for antineoplastic radiation therapy: Secondary | ICD-10-CM | POA: Diagnosis not present

## 2015-02-09 DIAGNOSIS — Z170 Estrogen receptor positive status [ER+]: Secondary | ICD-10-CM | POA: Diagnosis not present

## 2015-02-12 ENCOUNTER — Ambulatory Visit
Admission: RE | Admit: 2015-02-12 | Discharge: 2015-02-12 | Disposition: A | Discharge status: Home / Self Care | Payer: Commercial Managed Care - HMO | Source: Ambulatory Visit | Attending: Radiation Oncology | Admitting: Radiation Oncology

## 2015-02-12 ENCOUNTER — Telehealth: Payer: Self-pay | Admitting: *Deleted

## 2015-02-12 DIAGNOSIS — Z510 Encounter for antineoplastic radiation therapy: Secondary | ICD-10-CM | POA: Diagnosis not present

## 2015-02-12 DIAGNOSIS — Z170 Estrogen receptor positive status [ER+]: Secondary | ICD-10-CM | POA: Diagnosis not present

## 2015-02-12 DIAGNOSIS — C50312 Malignant neoplasm of lower-inner quadrant of left female breast: Secondary | ICD-10-CM | POA: Diagnosis not present

## 2015-02-12 NOTE — Telephone Encounter (Signed)
Spoke with patient to follow up after start of radiation. She states she is doing well except for some hoarseness and feeling like there is a lump in her throat. She has mentioned this to radiation therapists and will see Dr. Pablo Ledger tomorrow. Encouraged her to call with any needs or concerns.

## 2015-02-13 ENCOUNTER — Ambulatory Visit
Admission: RE | Admit: 2015-02-13 | Discharge: 2015-02-13 | Disposition: A | Discharge status: Home / Self Care | Payer: Commercial Managed Care - HMO | Source: Ambulatory Visit | Attending: Radiation Oncology | Admitting: Radiation Oncology

## 2015-02-13 VITALS — BP 128/70 | HR 76 | Temp 97.8°F | Wt 153.3 lb

## 2015-02-13 DIAGNOSIS — Z170 Estrogen receptor positive status [ER+]: Secondary | ICD-10-CM | POA: Diagnosis not present

## 2015-02-13 DIAGNOSIS — C50312 Malignant neoplasm of lower-inner quadrant of left female breast: Secondary | ICD-10-CM | POA: Diagnosis not present

## 2015-02-13 DIAGNOSIS — Z510 Encounter for antineoplastic radiation therapy: Secondary | ICD-10-CM | POA: Diagnosis not present

## 2015-02-13 NOTE — Progress Notes (Signed)
Weekly Management Note Current Dose:  18.69  Gy  Projected Dose:42.72  Gy   Narrative:  The patient presents for routine under treatment assessment.  CBCT/MVCT images/Port film x-rays were reviewed.  The chart was checked. Pt c/o of feeling of dysphagia and episode of odynophagia yesterday. Equates to starting RT. Also left leg cramp that she has had before. Walks it off and night and then is able to sleep.   Physical Findings: Weight: 153 lb 4.8 oz (69.536 kg). Unchanged. Left breast slightly swollen. No skin changes.   Impression:  The patient is tolerating radiation.  Plan:  Continue treatment as planned. Showed pt treatment plan and esophagus completely out of field. Encouraged her to try a PPI or see PCP for work up. Port images reviewed and are correct.

## 2015-02-13 NOTE — Progress Notes (Signed)
BP 128/70 mmHg  Pulse 76  Temp(Src) 97.8 F (36.6 C)  Wt 153 lb 4.8 oz (69.536 kg)  Weekly assessment of radiation to left breast.Denies pain.States she has sensation of lump in chest on swallowing.Informed this is unusual for breast treatment patients but I would inform Dr.Wentworth.No skin chnages.

## 2015-02-14 ENCOUNTER — Ambulatory Visit
Admission: RE | Admit: 2015-02-14 | Discharge: 2015-02-14 | Disposition: A | Discharge status: Home / Self Care | Payer: Commercial Managed Care - HMO | Source: Ambulatory Visit | Attending: Radiation Oncology | Admitting: Radiation Oncology

## 2015-02-14 DIAGNOSIS — Z510 Encounter for antineoplastic radiation therapy: Secondary | ICD-10-CM | POA: Diagnosis not present

## 2015-02-14 DIAGNOSIS — C50312 Malignant neoplasm of lower-inner quadrant of left female breast: Secondary | ICD-10-CM | POA: Diagnosis not present

## 2015-02-14 DIAGNOSIS — Z170 Estrogen receptor positive status [ER+]: Secondary | ICD-10-CM | POA: Diagnosis not present

## 2015-02-15 ENCOUNTER — Ambulatory Visit
Admission: RE | Admit: 2015-02-15 | Discharge: 2015-02-15 | Disposition: A | Discharge status: Home / Self Care | Payer: Commercial Managed Care - HMO | Source: Ambulatory Visit | Attending: Radiation Oncology | Admitting: Radiation Oncology

## 2015-02-15 DIAGNOSIS — C50312 Malignant neoplasm of lower-inner quadrant of left female breast: Secondary | ICD-10-CM | POA: Diagnosis not present

## 2015-02-15 DIAGNOSIS — Z170 Estrogen receptor positive status [ER+]: Secondary | ICD-10-CM | POA: Diagnosis not present

## 2015-02-15 DIAGNOSIS — Z510 Encounter for antineoplastic radiation therapy: Secondary | ICD-10-CM | POA: Diagnosis not present

## 2015-02-16 ENCOUNTER — Ambulatory Visit
Admission: RE | Admit: 2015-02-16 | Discharge: 2015-02-16 | Disposition: A | Discharge status: Home / Self Care | Payer: Commercial Managed Care - HMO | Source: Ambulatory Visit | Attending: Radiation Oncology | Admitting: Radiation Oncology

## 2015-02-16 DIAGNOSIS — C50312 Malignant neoplasm of lower-inner quadrant of left female breast: Secondary | ICD-10-CM | POA: Diagnosis not present

## 2015-02-16 DIAGNOSIS — Z170 Estrogen receptor positive status [ER+]: Secondary | ICD-10-CM | POA: Diagnosis not present

## 2015-02-16 DIAGNOSIS — Z510 Encounter for antineoplastic radiation therapy: Secondary | ICD-10-CM | POA: Diagnosis not present

## 2015-02-19 ENCOUNTER — Ambulatory Visit
Admission: RE | Admit: 2015-02-19 | Discharge: 2015-02-19 | Disposition: A | Discharge status: Home / Self Care | Payer: Commercial Managed Care - HMO | Source: Ambulatory Visit | Attending: Radiation Oncology | Admitting: Radiation Oncology

## 2015-02-19 DIAGNOSIS — C50312 Malignant neoplasm of lower-inner quadrant of left female breast: Secondary | ICD-10-CM

## 2015-02-19 DIAGNOSIS — Z510 Encounter for antineoplastic radiation therapy: Secondary | ICD-10-CM | POA: Diagnosis not present

## 2015-02-19 DIAGNOSIS — Z170 Estrogen receptor positive status [ER+]: Secondary | ICD-10-CM | POA: Diagnosis not present

## 2015-02-19 NOTE — Progress Notes (Signed)
Weekly Management Note:  CC: Dr. Thea Silversmith   Site: Left breast Current Dose:  2937  cGy Projected Dose: 4272  cGy  Narrative: The patient is seen today for routine under treatment assessment. CBCT/MVCT images/port films were reviewed. The chart was reviewed.   She is seen today with a recent history of neck pain.  This is been making it difficult for her to lie comfortably for her treatment.  She states that she developed mid to lower cervical spine discomfort last Wednesday.  Since then she has had a stiff neck.  There is no radiation of pain to the left or right upper extremities.  She has taken ibuprofen with some benefit.  She tells me she took  600 mg yesterday morning and another 800 mg yesterday evening.  She tells me that she has had intermittent neck pain because of "an extra disc".  She has seen Dr. Lindwood Qua in the past for orthopedic issues.  Physical Examination: There were no vitals filed for this visit..  Weight:  .  There is discomfort localized along the mid to lower cervical spine.  There is no point tenderness on palpation.  Her neck is somewhat stiff in range of motion is not tested.  Neurologic examination: She has excellent grip strength and there are no sensory deficits along both upper extremities.  Impression: Tolerating radiation therapy well except for unrelated orthopedic issue.  I suggested to her that she see Dr. Lindwood Qua for further evaluation.  She believes that she will be able to continue with her 5 remaining treatments.  She'll see Dr. Pablo Ledger tomorrow.  Plan: Continue radiation therapy as planned.  As above.

## 2015-02-19 NOTE — Progress Notes (Signed)
Sara Barnes has completed 11 fractions to her left breast.  She reports having increased upper back and neck pain radiation to the back of her head that started Wednesday evening.  She reports having trouble with her back in the past.  She reports having reduced range of motion in her neck.  She also reports having trouble sleeping and has to sleep upright in a chair.  She is currently taking ibuprofen for the pain.  She is wondering if pain medication would help her to get through her remaining treatments.    BP 130/75 mmHg  Pulse 72  Temp(Src) 98 F (36.7 C) (Oral)  Resp 18

## 2015-02-20 ENCOUNTER — Encounter: Payer: Self-pay | Admitting: Radiation Oncology

## 2015-02-20 ENCOUNTER — Ambulatory Visit
Admission: RE | Admit: 2015-02-20 | Discharge: 2015-02-20 | Disposition: A | Discharge status: Home / Self Care | Payer: Commercial Managed Care - HMO | Source: Ambulatory Visit | Attending: Radiation Oncology | Admitting: Radiation Oncology

## 2015-02-20 VITALS — BP 118/66 | HR 86 | Temp 98.0°F | Resp 20 | Wt 155.3 lb

## 2015-02-20 DIAGNOSIS — Z510 Encounter for antineoplastic radiation therapy: Secondary | ICD-10-CM | POA: Diagnosis not present

## 2015-02-20 DIAGNOSIS — C50312 Malignant neoplasm of lower-inner quadrant of left female breast: Secondary | ICD-10-CM | POA: Diagnosis not present

## 2015-02-20 DIAGNOSIS — Z170 Estrogen receptor positive status [ER+]: Secondary | ICD-10-CM | POA: Diagnosis not present

## 2015-02-20 NOTE — Progress Notes (Signed)
Weekly Management Note Current Dose:  18.69  Gy  Projected Dose:42.72  Gy   Narrative:  The patient presents for routine under treatment assessment.  CBCT/MVCT images/Port film x-rays were reviewed.  The chart was checked. Pt c/o of feeling of dysphagia and episode of odynophagia yesterday. Equates to starting RT. Also left leg cramp that she has had before. Walks it off and night and then is able to sleep.   Physical Findings: Weight: 155 lb 4.8 oz (70.444 kg). Unchanged. Left breast slightly swollen. No skin changes.   Impression:  The patient is tolerating radiation.  Plan:  Continue treatment as planned. Showed pt treatment plan and esophagus completely out of field. Encouraged her to try a PPI or see PCP for work up. Port images reviewed and are correct.

## 2015-02-20 NOTE — Progress Notes (Signed)
Weekly rad txs left breast 12/16 completed , mild erythema, using radiaplex tid, had rash on breast yesterday , not seen today, skin intact, , main c/o stiff neck, is better from yesterday, saw Dr. Valere Dross yesterday, will call her orthopaedic  If stiffness prolongs, will try ice/heat and gave small pillow to see if helps 10:46 AM BP 118/66 mmHg  Pulse 86  Temp(Src) 98 F (36.7 C) (Oral)  Resp 20  Wt 155 lb 4.8 oz (70.444 kg)  Wt Readings from Last 3 Encounters:  02/20/15 155 lb 4.8 oz (70.444 kg)  02/13/15 153 lb 4.8 oz (69.536 kg)  02/06/15 155 lb 1.6 oz (70.353 kg)

## 2015-02-20 NOTE — Progress Notes (Signed)
Weekly Management Note Current Dose:  32.04 Gy  Projected Dose:42.72  Gy   Narrative:  The patient presents for routine under treatment assessment.  CBCT/MVCT images/Port film x-rays were reviewed.  The chart was checked. Swallowing symptoms better. Now neck pain x 3 days. Taking ibuprofen, using icy hot. Feels it is better today than yesterday so has held off calling ortho.   Physical Findings: Weight: 155 lb 4.8 oz (70.444 kg). Unchanged. Left breast slightly swollen. No skin changes.   Impression:  The patient is tolerating radiation.  Plan:  Continue treatment as planned. Pt instructed to call ortho if not better by tomorrow.

## 2015-02-21 ENCOUNTER — Ambulatory Visit
Admission: RE | Admit: 2015-02-21 | Discharge: 2015-02-21 | Disposition: A | Discharge status: Home / Self Care | Payer: Commercial Managed Care - HMO | Source: Ambulatory Visit | Attending: Radiation Oncology | Admitting: Radiation Oncology

## 2015-02-21 DIAGNOSIS — C50312 Malignant neoplasm of lower-inner quadrant of left female breast: Secondary | ICD-10-CM | POA: Diagnosis not present

## 2015-02-21 DIAGNOSIS — Z510 Encounter for antineoplastic radiation therapy: Secondary | ICD-10-CM | POA: Diagnosis not present

## 2015-02-21 DIAGNOSIS — Z170 Estrogen receptor positive status [ER+]: Secondary | ICD-10-CM | POA: Diagnosis not present

## 2015-02-22 ENCOUNTER — Ambulatory Visit
Admission: RE | Admit: 2015-02-22 | Discharge: 2015-02-22 | Disposition: A | Discharge status: Home / Self Care | Payer: Commercial Managed Care - HMO | Source: Ambulatory Visit | Attending: Radiation Oncology | Admitting: Radiation Oncology

## 2015-02-22 DIAGNOSIS — C50312 Malignant neoplasm of lower-inner quadrant of left female breast: Secondary | ICD-10-CM | POA: Diagnosis not present

## 2015-02-22 DIAGNOSIS — Z170 Estrogen receptor positive status [ER+]: Secondary | ICD-10-CM | POA: Diagnosis not present

## 2015-02-22 DIAGNOSIS — Z510 Encounter for antineoplastic radiation therapy: Secondary | ICD-10-CM | POA: Diagnosis not present

## 2015-02-23 ENCOUNTER — Ambulatory Visit
Admission: RE | Admit: 2015-02-23 | Discharge: 2015-02-23 | Disposition: A | Discharge status: Home / Self Care | Payer: Commercial Managed Care - HMO | Source: Ambulatory Visit | Attending: Radiation Oncology | Admitting: Radiation Oncology

## 2015-02-23 DIAGNOSIS — Z510 Encounter for antineoplastic radiation therapy: Secondary | ICD-10-CM | POA: Diagnosis not present

## 2015-02-23 DIAGNOSIS — C50312 Malignant neoplasm of lower-inner quadrant of left female breast: Secondary | ICD-10-CM | POA: Diagnosis not present

## 2015-02-23 DIAGNOSIS — Z170 Estrogen receptor positive status [ER+]: Secondary | ICD-10-CM | POA: Diagnosis not present

## 2015-02-25 ENCOUNTER — Ambulatory Visit: Admission: RE | Admit: 2015-02-25 | Payer: Commercial Managed Care - HMO | Source: Ambulatory Visit

## 2015-02-26 ENCOUNTER — Ambulatory Visit: Payer: Commercial Managed Care - HMO

## 2015-02-26 ENCOUNTER — Encounter: Payer: Self-pay | Admitting: Radiation Oncology

## 2015-02-26 ENCOUNTER — Ambulatory Visit
Admission: RE | Admit: 2015-02-26 | Discharge: 2015-02-26 | Disposition: A | Discharge status: Home / Self Care | Payer: Commercial Managed Care - HMO | Source: Ambulatory Visit | Attending: Radiation Oncology | Admitting: Radiation Oncology

## 2015-02-26 ENCOUNTER — Telehealth: Payer: Self-pay | Admitting: *Deleted

## 2015-02-26 DIAGNOSIS — Z510 Encounter for antineoplastic radiation therapy: Secondary | ICD-10-CM | POA: Diagnosis not present

## 2015-02-26 DIAGNOSIS — Z170 Estrogen receptor positive status [ER+]: Secondary | ICD-10-CM | POA: Diagnosis not present

## 2015-02-26 DIAGNOSIS — C50312 Malignant neoplasm of lower-inner quadrant of left female breast: Secondary | ICD-10-CM | POA: Diagnosis not present

## 2015-02-26 NOTE — Telephone Encounter (Signed)
Spoke with patient to congratulate her on final XRT.  She states she is doing well, except for some tenderness, redness and a bit of a rash.  Encouraged her to call with any needs or concerns.

## 2015-03-01 ENCOUNTER — Ambulatory Visit (HOSPITAL_BASED_OUTPATIENT_CLINIC_OR_DEPARTMENT_OTHER): Payer: Commercial Managed Care - HMO | Admitting: Hematology

## 2015-03-01 ENCOUNTER — Encounter: Payer: Self-pay | Admitting: Hematology

## 2015-03-01 ENCOUNTER — Telehealth: Payer: Self-pay | Admitting: Hematology

## 2015-03-01 VITALS — BP 128/76 | HR 77 | Temp 98.5°F | Resp 20 | Ht 66.0 in | Wt 154.7 lb

## 2015-03-01 DIAGNOSIS — Z79890 Hormone replacement therapy (postmenopausal): Secondary | ICD-10-CM | POA: Diagnosis not present

## 2015-03-01 DIAGNOSIS — Z853 Personal history of malignant neoplasm of breast: Secondary | ICD-10-CM | POA: Diagnosis not present

## 2015-03-01 DIAGNOSIS — C50312 Malignant neoplasm of lower-inner quadrant of left female breast: Secondary | ICD-10-CM

## 2015-03-01 MED ORDER — LETROZOLE 2.5 MG PO TABS: 2.5000 mg | ORAL_TABLET | Freq: Every day | ORAL | Status: DC

## 2015-03-01 NOTE — Telephone Encounter (Signed)
per pt req to mail sch-mailed

## 2015-03-01 NOTE — Progress Notes (Signed)
[facility]  [phone] Fax:([phone]) [phone]  Clinic Follow Up Note   Patient Care Team: Rosalita Chessman, DO as PCP - General [place] Dragon, MD as Consulting Physician (Gastroenterology) [name], MD as Consulting Physician (General Surgery) Truitt Merle, MD as Consulting Physician (Hematology) Thea Silversmith, MD as Consulting Physician (Radiation Oncology) Mauro Kaufmann, RN as Registered Nurse Rockwell Germany, RN as Registered Nurse Holley Bouche, NP as Nurse Practitioner (Nurse Practitioner) Sylvan Cheese, NP as Nurse Practitioner (Nurse Practitioner) 03/01/2015  CHIEF COMPLAINTS:  Follow up left breast cancer     Oncology History   Cancer of lower-inner quadrant of left female breast [facility])   Staging form: Breast, AJCC 7th Edition     Clinical stage from 12/06/2014: Stage IA (T1c, N0, M0) - Unsigned     Pathologic stage from 12/13/2014: Stage IA (T1c, N0, cM0) - Unsigned        Cancer of lower-inner quadrant of left female breast ([place])   11/28/2014 Breast US Highly suspicious 1.6 cm irregular shadowing mass at the site of palpable concern in the subareolar lower inner quadrant of the left breast at approximately 630.   11/29/2014 Receptors her2 Estrogen Receptor: 100%, POSITIVE, STRONG STAINING INTENSITY (PERFORMED MANUALLY) Progesterone Receptor: 95%, POSITIVE, STRONG STAINING INTENSITY (PERFORMED MANUALLY) Proliferation Marker Ki67: 10% (PERFORMED MANUALLY), Her2neu negative   11/29/2014 Initial Biopsy Breast, left, needle core biopsy, subareolar about 6:30 - INVASIVE DUCTAL CARCINOMA, SEE COMMENT.   11/30/2014 Initial Diagnosis Cancer of lower-inner quadrant of left female breast   12/13/2014 Surgery Left breast lumpectomy, with sentinel lymph nodes biopsy   12/13/2014 Pathology Results Left breast lumpectomy showed a 1.7 cm invasive ductal carcinoma, grade 1, margins were negative, (+) LVI, 5 Sentinel lymph nodes were negative,   12/13/2014  Oncotype testing RS 4, which predicts 10-year risk of distant recurrence 4% with tamoxifen alone.   02/05/2015 - 02/26/2015 Radiation Therapy left breast radiaiton     HISTORY OF PRESENTING ILLNESS:  Sara Barnes 74 y.o. female is here because of newly diagnosed left breast cancer. She presents to our multidisciplinary breast clinic with her sister today.  She had a plpable left breast mass 3 weeks ago, nontender, no significant skin or nipple change, she also reports some left arm discomfort lately, no swelling or limitation on the range of motion. She otherwise feels well, no other complains. She is little bit harder hearing.  She had benign left breast lesion long time ago and had bilateral inplants placed when when she was 29. She is postmenopausal, never taken any hormonal replacement, no family history of breast cancer.  She has colitis, followed by Dr. Cathlean Cower, she has intermittent diarrhea, she was on low dose prednisone and sulidine until a month ago. She is physically active, has good appetite and energy level. She lives alone, her sister lives very close to her. She has one son who lives in [place] also.  INTERIM HISTORY Dashawna returns for follow-up. She Finished left breast radiation therapy 3 days ago. She tolerated the therapy very well, has mild skin reaction and itchiness, no skin break down. She has mild fatigue, no other complains.    MEDICAL HISTORY:  Past Medical History  Diagnosis Date  . Vitamin B12 deficiency   . Ulcerative colitis   . Indigestion   . Cancer of lower-inner quadrant of female breast ([place]) 11/30/2014    left  . Complication of anesthesia     anxiety post-op after ear surgeries  .  PONV (postoperative nausea and vomiting)   . Family history of adverse reaction to anesthesia     pt's sister has hx. of post-op N/V  . Dental crowns present     recent root canal 11/2014  . Runny nose 12/07/2014    clear drainage, per pt.  . Seasonal allergies      SURGICAL HISTORY: Past Surgical History  Procedure Laterality Date  . Abdominal hysterectomy      partial  . Appendectomy    . Breast enhancement surgery    . Incision and drainage perirectal abscess N/A 09/28/2013    Procedure: IRRIGATION AND DEBRIDEMENT PERIANAL ABSCESS, proctoscopy;  Surgeon: Shann Medal, MD;  Location: [facility];  Service: General;  Laterality: N/A;  . Breast lumpectomy Left     age 26's  . Inner ear surgery Bilateral   . Colonoscopy with propofol  05/28/2011; 06/28/2014  . Breast lumpectomy with radioactive seed and sentinel lymph node biopsy Left 12/13/2014    Procedure: LEFT BREAST LUMPECTOMY WITH RADIOACTIVE SEED AND SENTINEL LYMPH NODE BIOPSY;  Surgeon: [name], MD;  Location: [facility];  Service: General;  Laterality: Left;   GYN HISTORY  Menarchal: 12 LMP: 76 (hysterectomy) Contraceptive: 2 years  HRT: no  G1P1: one son 29 yo, no breast feeding    SOCIAL HISTORY: Social History   Social History  . Marital Status: Widowed    Spouse Name: N/A  . Number of Children: N/A  . Years of Education: N/A   Occupational History  . 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 Smoking status: Never Smoker   . Smokeless tobacco: Never Used  . Alcohol Use: Yes     Comment: daily wine with dinner  . Drug Use: No  . Sexual Activity:    Partners: Male   Other Topics Concern  . Not on file   Social History Narrative   Treadmill/ walking--exercise    FAMILY HISTORY: Family History  Problem Relation Age of Onset  . Anesthesia problems Sister     post-op N/V  . Prostate cancer Brother 27  . Kidney disease Brother   . Diabetes Brother   . Emphysema Maternal Grandfather   . Asthma Sister   . Brain cancer Father   . Rheum arthritis Mother   . Rheum arthritis Brother     ALLERGIES:  is allergic to codeine; mesalamine; other; oxycodone-acetaminophen; and sulfa antibiotics.  MEDICATIONS:  Current  Outpatient Prescriptions  Medication Sig Dispense Refill  . acetaminophen (TYLENOL) 325 MG tablet Take 650 mg by mouth every 6 (six) hours as needed.    . cyanocobalamin (,VITAMIN B-12,) 1000 MCG/ML injection INJECT 1 ML (1,000 MCG TOTAL) INTO THE MUSCLE EVERY 30 (THIRTY) DAYS. 3 mL 1  . famotidine (PEPCID) 20 MG tablet Take 20 mg by mouth 2 (two) times daily.    Marland Kitchen FLUoxetine (PROZAC) 20 MG capsule TAKE ONE CAPSULE BY MOUTH ONCE DAILY 90 capsule 1  . ibuprofen (ADVIL,MOTRIN) 200 MG tablet Take 400 mg by mouth every 6 (six) hours as needed for mild pain (pain).     Marland Kitchen letrozole (FEMARA) 2.5 MG tablet Take 1 tablet (2.5 mg total) by mouth daily. 30 tablet 1   No current facility-administered medications for this visit.    REVIEW OF SYSTEMS:   Constitutional: Denies fevers, chills or abnormal night sweats Eyes: Denies blurriness of vision, double vision or watery eyes Ears, nose, mouth, throat, and face:  Denies mucositis or sore throat Respiratory: Denies cough, dyspnea or wheezes Cardiovascular: Denies palpitation, chest discomfort or lower extremity swelling Gastrointestinal:  Denies nausea, heartburn or change in bowel habits Skin: Denies abnormal skin rashes Lymphatics: Denies new lymphadenopathy or easy bruising Neurological:Denies numbness, tingling or new weaknesses Behavioral/Psych: Mood is stable, no new changes  All other systems were reviewed with the patient and are negative.  PHYSICAL EXAMINATION: ECOG PERFORMANCE STATUS: 1  Filed Vitals:   03/01/15 1026  BP: 128/76  Pulse: 77  Temp: 98.5 F (36.9 C)  Resp: 20   Filed Weights   03/01/15 1026  Weight: 154 lb 11.2 oz (70.171 kg)    GENERAL:alert, no distress and comfortable SKIN: skin color, texture, turgor are normal, no rashes or significant lesions EYES: normal, conjunctiva are pink and non-injected, sclera clear OROPHARYNX:no exudate, no erythema and lips, buccal mucosa, and tongue normal  NECK: supple, thyroid  normal size, non-tender, without nodularity LYMPH:  no palpable lymphadenopathy in the cervical, axillary or inguinal LUNGS: clear to auscultation and percussion with normal breathing effort HEART: regular rate & rhythm and no murmurs and no lower extremity edema ABDOMEN:abdomen soft, non-tender and normal bowel sounds Musculoskeletal:no cyanosis of digits and no clubbing  PSYCH: alert & oriented x 3 with fluent speech NEURO: no focal motor/sensory deficits Breasts: Breast inspection showed them to be symmetrical with no nipple discharge. The left breast surgical incision site is well healed. There are scattered small skin rashes in the left breast area, secondary to radiation. No significant skin pigmentation all diffuse erythema. No skin ulcers or blisters.discharge. Palpitation of both breasts and axillas revealed no obvious mass that I could appreciate.   LABORATORY DATA:  I have reviewed the data as listed Lab Results  Component Value Date   WBC 10.2 01/30/2015   HGB 14.5 01/30/2015   HCT 43.0 01/30/2015   MCV 91.0 01/30/2015   PLT 300.0 01/30/2015    Recent Labs  07/20/14 1111 12/06/14 0804 01/30/15 1043  NA  --  138 136  K  --  4.1 4.0  CL  --   --  101  CO2  --  25 28  GLUCOSE  --  92 89  BUN  --  12.3 15  CREATININE  --  0.8 0.68  CALCIUM  --  9.4 9.1  PROT 7.2 6.5 7.3  ALBUMIN 4.0 3.3* 4.1  AST 15 9 14   ALT 9 10 10   ALKPHOS 84 89 109  BILITOT 0.5 0.37 0.4  BILIDIR 0.1  --   --    PATHOLOGY REPORT Diagnosis 12/13/2014 1. Breast, lumpectomy, left - INVASIVE GRADE I DUCTAL CARCINOMA, SPANNING 1.7 CM IN GREATEST DIMENSION. - ASSOCIATED INTERMEDIATE GRADE DUCTAL CARCINOMA IN SITU. - LYMPH/VASCULAR INVASION IS IDENTIFIED. - INVASIVE DUCTAL CARCINOMA IS CLOSE (LESS THAN 0.2 CM) FROM INFERIOR AND POSTERIOR MARGINS. - OTHER MARGINS ARE NEGATIVE. - SEE ONCOLOGY TEMPLATE BELOW. 2. Lymph node, sentinel, biopsy, left axillary #1 - ONE BENIGN LYMPH NODE WITH NO TUMOR  SEEN (0/1). 3. Lymph node, sentinel, biopsy, left axillary #2 - ONE BENIGN LYMPH NODE WITH NO TUMOR SEEN (0/1). 4. Lymph node, sentinel, biopsy, left axillary #3 - ONE BENIGN LYMPH NODE WITH NO TUMOR SEEN (0/1). 5. Lymph node, sentinel, biopsy, left axillary #4 - ONE BENIGN LYMPH NODE WITH NO TUMOR SEEN (0/1). 6. Lymph node, sentinel, biopsy, Left axillary #5 - ONE BENIGN LYMPH NODE WITH NO TUMOR SEEN (0/1). Microscopic Comment 1. BREAST, INVASIVE TUMOR, WITH LYMPH NODES PRESENT Specimen,  including laterality and lymph node sampling (sentinel, non-sentinel): Left partial breast with left sentinel lymph node sampling. Procedure: Left breast lumpectomy with left sentinel lymph node biopsies. Histologic type: Invasive ductal carcinoma. Grade: 1. Tubule formation: 2. Nuclear pleomorphism: 2. Mitotic: 1. Tumor size (gross measurement): 1.7 cm. Margins: Invasive, distance to closest margin: Invasive ductal carcinoma is close (less than 0.2 cm) from inferior and posterior margins. In-situ, distance to closest margin: Ductal carcinoma in situ is away (at least 0.4 cm) from all margins. Lymphovascular invasion: Yes, lymph/vascular is identified. Ductal carcinoma in situ: Yes. Grade: Intermediate. Extensive intraductal component: No. Lobular neoplasia: Not identified. Tumor focality: Unifocal. Treatment effect: Not applicable. Extent of tumor: Tumor confined to breast parenchyma. Skin: Not received. Nipple: Not received. Skeletal muscle: Not received. Lymph nodes: Examined: 5 Sentinel. 0 Non-sentinel. 5 Total. Lymph nodes with metastasis: 0. Isolated tumor cells (< 0.2 mm): 0. Micrometastasis: (> 0.2 mm and < 2.0 mm): 0. Macrometastasis: (> 2.0 mm): 0. Extracapsular extension: Not applicable. Breast prognostic profile: Performed on previous case [id number]. Estrogen receptor: 100%, positive. Progesterone receptor: 95%, positive. Her 2 neu: 1.31 ratio, negative. Ki-67:  10%. Non-neoplastic breast: Fibrocystic changes. TNM: pT1c, pN0.  Comments: As Her-2 neu was previously negative, this will be repeated on the invasive tumor and reported in an addendum to follow. (RH:ds 12/15/14) ROBERT  1. FLUORESCENCE IN-SITU HYBRIDIZATION Results: HER2 - NEGATIVE RATIO OF HER2/CEP17 SIGNALS 1.42 AVERAGE HER2 COPY NUMBER PER CELL 1.85  RADIOGRAPHIC STUDIES: I have personally reviewed the radiological images as listed and agreed with the findings in the report.    ASSESSMENT & PLAN:  74 year old patient female, with past medical history of colitis, depression, otherwise in good health, who presents with a palpable left breast mass.  1. Left breast invasive ductal carcinoma, cT1bN0M0, stage IA, ER+/PR+, HER2 (-) -I reviewed her surgical pathology findings with her in details. -I discussed the Oncotype DX test result with her. The recurrence score is 4, which predicts 4% of distant recurrence risk in 10 years with tamoxifen. -Giving the low recurrence score on Oncotype test, she will likely do very well, and she would not need adjuvant chemotherapy. -she has completed adjuvant breast irradiation, and tolerated well. -Giving the strong ER/PR positivity, and her postmenopausal status, I recommend aromatase inhibitor as adjuvant endocrine therapy to reduce the risk of cancer recurrence. Potential side effects, which includes but not limited to hot flash, skin and vaginal dryness, muscular in the bone stiffness and pain, slightly increased risk of cardiovascular disease and cataracts, were reviewed with her. She agrees to proceed, but would like to start at a low dose to see if her body can tolerated. I sent a prescription of letrozole to her pharmacy, she will start in 2 weeks when she recovers better from radiation, and starting at half tablets a day for 2 weeks. If she tolerated well, she'll go up to 1 tablet day afterwards.  2. Colitis, arthritis, depression -She will  continue follow-up with her primary care physician and Dr. Cathlean Cower  3. History of B12 deficiency -She'll continue to oral B12. She had a question about the relationship of B12 and her breast cancer, and I explained to her that there is no definitive evidence of coloration.  Plan: -start letrozole at 1.25 mg daily in 2 weeks, if she tolerates well for 2 weeks, she will go up to full dose at  2.5 mg daily -I'll see her back in 6 weeks  All questions were answered. The patient knows to  call the clinic with any problems, questions or concerns. I spent 25 minutes counseling the patient face to face. The total time spent in the appointment was 30 minutes and more than 50% was on counseling.     Truitt Merle, MD 03/01/2015 10:39 AM

## 2015-03-18 NOTE — Progress Notes (Signed)
Radiation Oncology         [phone] ________________________________  Name: Sara Barnes      MRN: [id number]          Date: 01/25/15             DOB: [birth year]  Optical Surface Tracking Plan:  Since intensity modulated radiotherapy (IMRT) and 3D conformal radiation treatment methods are predicated on accurate and precise positioning for treatment, intrafraction motion monitoring is medically necessary to ensure accurate and safe treatment delivery.  The ability to quantify intrafraction motion without excessive ionizing radiation dose can only be performed with optical surface tracking. Accordingly, surface imaging offers the opportunity to obtain 3D measurements of patient position throughout IMRT and 3D treatments without excessive radiation exposure.  I am ordering optical surface tracking for this patient's upcoming course of radiotherapy. ________________________________ Signature   Reference:   Ursula Alert, J, et al. Surface imaging-based analysis of intrafraction motion for breast radiotherapy patients.Journal of [place], n. 6, nov. 2014. ISSN [id number].   Available at: <http://www.jacmp.org/index.php/jacmp/article/view/4957>.

## 2015-03-18 NOTE — Progress Notes (Signed)
  Radiation Oncology         [phone] ________________________________  Name: Sara Barnes MRN: [id number]  Date: 02/26/2015  DOB: 1940/06/24  End of Treatment Note  Diagnosis:   Cancer of lower-inner quadrant of left female breast [facility])   Staging form: Breast, AJCC 7th Edition     Clinical stage from 12/06/2014: Stage IA (T1c, N0, M0) - Unsigned     Pathologic stage from 12/13/2014: Stage IA (T1c, N0, cM0) - Unsigned    Indication for treatment:  Curative     Radiation treatment dates:   02/05/2015-02/26/2015  Site/dose:   Left breast/ 42.72 Gy at 2.67 Gy per fraction x 21 fractions.   Beams/energy:  Opposed tangents with reduced fields / 6 MV photons  Narrative: The patient tolerated radiation treatment relatively well.   She experienced neck pain as well as dysphagia not felt to be related to radiation. She was encouraged to follow up with orthopedics and her family doctor.   Plan: The patient has completed radiation treatment. The patient will return to radiation oncology clinic for routine followup in one month. I advised them to call or return sooner if they have any questions or concerns related to their recovery or treatment.  ------------------------------------------------  Thea Silversmith, MD

## 2015-03-20 ENCOUNTER — Other Ambulatory Visit: Payer: Self-pay | Admitting: Family Medicine

## 2015-04-07 DIAGNOSIS — M542 Cervicalgia: Secondary | ICD-10-CM | POA: Diagnosis not present

## 2015-04-09 ENCOUNTER — Telehealth: Payer: Self-pay | Admitting: Family Medicine

## 2015-04-09 NOTE — Telephone Encounter (Signed)
Caller name: Self   Can be reached: [phone]   Reason for call: Need referral to Dr. Gladstone Lighter @ [facility]. Patient saw him on Saturday but did not have a referral. Request that referral be faxed to their office.  Plse call patient when referral has been sent

## 2015-04-09 NOTE — Telephone Encounter (Signed)
Did she give you a reason for the referral?

## 2015-04-09 NOTE — Telephone Encounter (Signed)
Called patient back but she states that [facility] called her and said that it had been taken care of.

## 2015-04-10 ENCOUNTER — Other Ambulatory Visit: Payer: Self-pay | Admitting: *Deleted

## 2015-04-10 DIAGNOSIS — C50312 Malignant neoplasm of lower-inner quadrant of left female breast: Secondary | ICD-10-CM

## 2015-04-11 ENCOUNTER — Ambulatory Visit (HOSPITAL_BASED_OUTPATIENT_CLINIC_OR_DEPARTMENT_OTHER): Payer: Commercial Managed Care - HMO | Admitting: Hematology

## 2015-04-11 ENCOUNTER — Other Ambulatory Visit: Payer: Commercial Managed Care - HMO

## 2015-04-11 ENCOUNTER — Encounter: Payer: Self-pay | Admitting: Hematology

## 2015-04-11 ENCOUNTER — Telehealth: Payer: Self-pay | Admitting: Hematology

## 2015-04-11 VITALS — BP 119/69 | HR 75 | Temp 97.9°F | Resp 18 | Ht 66.0 in | Wt 157.2 lb

## 2015-04-11 DIAGNOSIS — C50312 Malignant neoplasm of lower-inner quadrant of left female breast: Secondary | ICD-10-CM

## 2015-04-11 DIAGNOSIS — K5289 Other specified noninfective gastroenteritis and colitis: Secondary | ICD-10-CM

## 2015-04-11 NOTE — Progress Notes (Signed)
Sara Barnes  [phone] [phone]  Clinic Follow Up Note   Patient Care Team: Rosalita Chessman, DO as PCP - General [place] Dragon, MD as Consulting Physician (Gastroenterology) [name], MD as Consulting Physician (General Surgery) Truitt Merle, MD as Consulting Physician (Hematology) Thea Silversmith, MD as Consulting Physician (Radiation Oncology) Mauro Kaufmann, RN as Registered Nurse Rockwell Germany, RN as Registered Nurse Holley Bouche, NP as Nurse Practitioner (Nurse Practitioner) Sylvan Cheese, NP as Nurse Practitioner (Nurse Practitioner) 04/11/2015  CHIEF COMPLAINTS:  Follow up left breast cancer     Oncology History   Cancer of lower-inner quadrant of left female breast [facility])   Staging form: Breast, AJCC 7th Edition     Clinical stage from 12/06/2014: Stage IA (T1c, N0, M0) - Unsigned     Pathologic stage from 12/13/2014: Stage IA (T1c, N0, cM0) - Unsigned        Cancer of lower-inner quadrant of left female breast ([place])   11/28/2014 Breast US Highly suspicious 1.6 cm irregular shadowing mass at the site of palpable concern in the subareolar lower inner quadrant of the left breast at approximately 630.   11/29/2014 Receptors her2 Estrogen Receptor: 100%, POSITIVE, STRONG STAINING INTENSITY (PERFORMED MANUALLY) Progesterone Receptor: 95%, POSITIVE, STRONG STAINING INTENSITY (PERFORMED MANUALLY) Proliferation Marker Ki67: 10% (PERFORMED MANUALLY), Her2neu negative   11/29/2014 Initial Biopsy Breast, left, needle core biopsy, subareolar about 6:30 - INVASIVE DUCTAL CARCINOMA, SEE COMMENT.   11/30/2014 Initial Diagnosis Cancer of lower-inner quadrant of left female breast   12/13/2014 Surgery Left breast lumpectomy, with sentinel lymph nodes biopsy   12/13/2014 Pathology Results Left breast lumpectomy showed a 1.7 cm invasive ductal carcinoma, grade 1, margins were negative, (+) LVI, 5 Sentinel lymph nodes were negative,   12/13/2014  Oncotype testing RS 4, which predicts 10-year risk of distant recurrence 4% with tamoxifen alone.   02/05/2015 - 02/26/2015 Radiation Therapy left breast radiaiton    03/18/2015 -  Anti-estrogen oral therapy She tried witch hazel for 2-3 weeks, could not tolerate and stopped.    HISTORY OF PRESENTING ILLNESS:  Sara Barnes 74 y.o. female is here because of newly diagnosed left breast cancer. She presents to our multidisciplinary breast clinic with her sister today.  She had a plpable left breast mass 3 weeks ago, nontender, no significant skin or nipple change, she also reports some left arm discomfort lately, no swelling or limitation on the range of motion. She otherwise feels well, no other complains. She is little bit harder hearing.  She had benign left breast lesion long time ago and had bilateral inplants placed when when she was 61. She is postmenopausal, never taken any hormonal replacement, no family history of breast cancer.  She has colitis, followed by Dr. Cathlean Cower, she has intermittent diarrhea, she was on low dose prednisone and sulidine until a month ago. She is physically active, has good appetite and energy level. She lives alone, her sister lives very close to her. She has one son who lives in [place] also.  CURRENT THERAPY: Observation  INTERIM HISTORY Sara Barnes returns for follow-up. She started letrozole at half tablet a day about 3-4 weeks ago, initially tolerated well, and increased to 1 tablet a day, developed a severe epigastric pain, diarrhea, abdominal cramps, and stopped after a few doses. Her GI symptoms status improved afterwards. She also stopped ibuprofen because of her gastric pain, then her neck pain flared up. She is currently on low dose steroids for  neck pain, with some improvement. She is very concerned about the side effects from letrozole, and reluctant to try to gain. The   MEDICAL HISTORY:  Past Medical History  Diagnosis Date  . Vitamin B12 deficiency    . Ulcerative colitis   . Indigestion   . Cancer of lower-inner quadrant of female breast ([place]) 11/30/2014    left  . Complication of anesthesia     anxiety post-op after ear surgeries  . PONV (postoperative nausea and vomiting)   . Family history of adverse reaction to anesthesia     pt's sister has hx. of post-op N/V  . Dental crowns present     recent root canal 11/2014  . Runny nose 12/07/2014    clear drainage, per pt.  . Seasonal allergies     SURGICAL HISTORY: Past Surgical History  Procedure Laterality Date  . Abdominal hysterectomy      partial  . Appendectomy    . Breast enhancement surgery    . Incision and drainage perirectal abscess N/A 09/28/2013    Procedure: IRRIGATION AND DEBRIDEMENT PERIANAL ABSCESS, proctoscopy;  Surgeon: Shann Medal, MD;  Location: [facility];  Service: General;  Laterality: N/A;  . Breast lumpectomy Left     age 38's  . Inner ear surgery Bilateral   . Colonoscopy with propofol  05/28/2011; 06/28/2014  . Breast lumpectomy with radioactive seed and sentinel lymph node biopsy Left 12/13/2014    Procedure: LEFT BREAST LUMPECTOMY WITH RADIOACTIVE SEED AND SENTINEL LYMPH NODE BIOPSY;  Surgeon: [name], MD;  Location: [facility];  Service: General;  Laterality: Left;   GYN HISTORY  Menarchal: 12 LMP: 53 (hysterectomy) Contraceptive: 2 years  HRT: no  G1P1: one son 81 yo, no breast feeding    SOCIAL HISTORY: Social History   Social History  . Marital Status: Widowed    Spouse Name: N/A  . Number of Children: N/A  . Years of Education: N/A   Occupational History  . 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 Smoking status: Never Smoker   . Smokeless tobacco: Never Used  . Alcohol Use: Yes     Comment: daily wine with dinner  . Drug Use: No  . Sexual Activity:    Partners: Male   Other Topics Concern  . Not on file   Social History Narrative   Treadmill/ walking--exercise    FAMILY  HISTORY: Family History  Problem Relation Age of Onset  . Anesthesia problems Sister     post-op N/V  . Prostate cancer Brother 63  . Kidney disease Brother   . Diabetes Brother   . Emphysema Maternal Grandfather   . Asthma Sister   . Brain cancer Father   . Rheum arthritis Mother   . Rheum arthritis Brother     ALLERGIES:  is allergic to codeine; mesalamine; other; oxycodone-acetaminophen; and sulfa antibiotics.  MEDICATIONS:  Current Outpatient Prescriptions  Medication Sig Dispense Refill  . acetaminophen (TYLENOL) 325 MG tablet Take 650 mg by mouth every 6 (six) hours as needed.    Marland Kitchen FLUoxetine (PROZAC) 20 MG capsule TAKE ONE CAPSULE BY MOUTH ONCE DAILY 90 capsule 1  . predniSONE (STERAPRED UNI-PAK 21 TAB) 5 MG (21) TBPK tablet Take 5 mg by mouth 3 (three) times daily as needed.    . cyanocobalamin (,VITAMIN B-12,) 1000 MCG/ML injection INJECT 1 ML (1,000 MCG TOTAL) INTO THE MUSCLE EVERY 30 (THIRTY) DAYS. (Patient not  taking: Reported on 04/11/2015) 3 mL 1  . famotidine (PEPCID) 20 MG tablet Take 20 mg by mouth 2 (two) times daily. Reported on 04/11/2015    . ibuprofen (ADVIL,MOTRIN) 200 MG tablet Take 400 mg by mouth every 6 (six) hours as needed for mild pain (pain). Reported on 04/11/2015    . letrozole (FEMARA) 2.5 MG tablet Take 1 tablet (2.5 mg total) by mouth daily. (Patient not taking: Reported on 04/11/2015) 30 tablet 1   No current facility-administered medications for this visit.    REVIEW OF SYSTEMS:   Constitutional: Denies fevers, chills or abnormal night sweats Eyes: Denies blurriness of vision, double vision or watery eyes Ears, nose, mouth, throat, and face: Denies mucositis or sore throat Respiratory: Denies cough, dyspnea or wheezes Cardiovascular: Denies palpitation, chest discomfort or lower extremity swelling Gastrointestinal:  Denies nausea, heartburn or change in bowel habits Skin: Denies abnormal skin rashes Lymphatics: Denies new lymphadenopathy or  easy bruising Neurological:Denies numbness, tingling or new weaknesses Behavioral/Psych: Mood is stable, no new changes  All other systems were reviewed with the patient and are negative.  PHYSICAL EXAMINATION: ECOG PERFORMANCE STATUS: 1  Filed Vitals:   04/11/15 1138  BP: 119/69  Pulse: 75  Temp: 97.9 F (36.6 C)  Resp: 18   Filed Weights   04/11/15 1138  Weight: 157 lb 3.2 oz (71.305 kg)    GENERAL:alert, no distress and comfortable SKIN: skin color, texture, turgor are normal, no rashes or significant lesions EYES: normal, conjunctiva are pink and non-injected, sclera clear OROPHARYNX:no exudate, no erythema and lips, buccal mucosa, and tongue normal  NECK: supple, thyroid normal size, non-tender, without nodularity LYMPH:  no palpable lymphadenopathy in the cervical, axillary or inguinal LUNGS: clear to auscultation and percussion with normal breathing effort HEART: regular rate & rhythm and no murmurs and no lower extremity edema ABDOMEN:abdomen soft, non-tender and normal bowel sounds Musculoskeletal:no cyanosis of digits and no clubbing  PSYCH: alert & oriented x 3 with fluent speech NEURO: no focal motor/sensory deficits Breasts: Breast inspection showed them to be symmetrical with no nipple discharge. The left breast surgical incision site is well healed. There are scattered small skin rashes in the left breast area, secondary to radiation. No significant skin pigmentation all diffuse erythema. No skin ulcers or blisters.discharge. Palpitation of both breasts and axillas revealed no obvious mass that I could appreciate.   LABORATORY DATA:  I have reviewed the data as listed Lab Results  Component Value Date   WBC 10.2 01/30/2015   HGB 14.5 01/30/2015   HCT 43.0 01/30/2015   MCV 91.0 01/30/2015   PLT 300.0 01/30/2015    Recent Labs  07/20/14 1111 12/06/14 0804 01/30/15 1043  NA  --  138 136  K  --  4.1 4.0  CL  --   --  101  CO2  --  25 28  GLUCOSE  --   92 89  BUN  --  12.3 15  CREATININE  --  0.8 0.68  CALCIUM  --  9.4 9.1  PROT 7.2 6.5 7.3  ALBUMIN 4.0 3.3* 4.1  AST 15 9 14   ALT 9 10 10   ALKPHOS 84 89 109  BILITOT 0.5 0.37 0.4  BILIDIR 0.1  --   --    PATHOLOGY REPORT Diagnosis 12/13/2014 1. Breast, lumpectomy, left - INVASIVE GRADE I DUCTAL CARCINOMA, SPANNING 1.7 CM IN GREATEST DIMENSION. - ASSOCIATED INTERMEDIATE GRADE DUCTAL CARCINOMA IN SITU. - LYMPH/VASCULAR INVASION IS IDENTIFIED. - INVASIVE DUCTAL CARCINOMA IS  CLOSE (LESS THAN 0.2 CM) FROM INFERIOR AND POSTERIOR MARGINS. - OTHER MARGINS ARE NEGATIVE. - SEE ONCOLOGY TEMPLATE BELOW. 2. Lymph node, sentinel, biopsy, left axillary #1 - ONE BENIGN LYMPH NODE WITH NO TUMOR SEEN (0/1). 3. Lymph node, sentinel, biopsy, left axillary #2 - ONE BENIGN LYMPH NODE WITH NO TUMOR SEEN (0/1). 4. Lymph node, sentinel, biopsy, left axillary #3 - ONE BENIGN LYMPH NODE WITH NO TUMOR SEEN (0/1). 5. Lymph node, sentinel, biopsy, left axillary #4 - ONE BENIGN LYMPH NODE WITH NO TUMOR SEEN (0/1). 6. Lymph node, sentinel, biopsy, Left axillary #5 - ONE BENIGN LYMPH NODE WITH NO TUMOR SEEN (0/1). Microscopic Comment 1. BREAST, INVASIVE TUMOR, WITH LYMPH NODES PRESENT Specimen, including laterality and lymph node sampling (sentinel, non-sentinel): Left partial breast with left sentinel lymph node sampling. Procedure: Left breast lumpectomy with left sentinel lymph node biopsies. Histologic type: Invasive ductal carcinoma. Grade: 1. Tubule formation: 2. Nuclear pleomorphism: 2. Mitotic: 1. Tumor size (gross measurement): 1.7 cm. Margins: Invasive, distance to closest margin: Invasive ductal carcinoma is close (less than 0.2 cm) from inferior and posterior margins. In-situ, distance to closest margin: Ductal carcinoma in situ is away (at least 0.4 cm) from all margins. Lymphovascular invasion: Yes, lymph/vascular is identified. Ductal carcinoma in situ: Yes. Grade:  Intermediate. Extensive intraductal component: No. Lobular neoplasia: Not identified. Tumor focality: Unifocal. Treatment effect: Not applicable. Extent of tumor: Tumor confined to breast parenchyma. Skin: Not received. Nipple: Not received. Skeletal muscle: Not received. Lymph nodes: Examined: 5 Sentinel. 0 Non-sentinel. 5 Total. Lymph nodes with metastasis: 0. Isolated tumor cells (< 0.2 mm): 0. Micrometastasis: (> 0.2 mm and < 2.0 mm): 0. Macrometastasis: (> 2.0 mm): 0. Extracapsular extension: Not applicable. Breast prognostic profile: Performed on previous case [id number]. Estrogen receptor: 100%, positive. Progesterone receptor: 95%, positive. Her 2 neu: 1.31 ratio, negative. Ki-67: 10%. Non-neoplastic breast: Fibrocystic changes. TNM: pT1c, pN0.  Comments: As Her-2 neu was previously negative, this will be repeated on the invasive tumor and reported in an addendum to follow. (RH:ds 12/15/14) ROBERT  1. FLUORESCENCE IN-SITU HYBRIDIZATION Results: HER2 - NEGATIVE RATIO OF HER2/CEP17 SIGNALS 1.42 AVERAGE HER2 COPY NUMBER PER CELL 1.85  RADIOGRAPHIC STUDIES: I have personally reviewed the radiological images as listed and agreed with the findings in the report.    ASSESSMENT & PLAN:  74 year old patient female, with past medical history of colitis, depression, otherwise in good health, who presents with a palpable left breast mass.  1. Left breast invasive ductal carcinoma, cT1bN0M0, stage IA, ER+/PR+, HER2 (-) -I reviewed her surgical pathology findings with her in details. -I discussed the Oncotype DX test result with her. The recurrence score is 4, which predicts 4% of distant recurrence risk in 10 years with tamoxifen. -Giving the low recurrence score on Oncotype test, she will likely do very well, and she would not need adjuvant chemotherapy. -she has completed adjuvant breast irradiation, and tolerated well. -Giving the strong ER/PR positivity, and her  postmenopausal status, I recommended aromatase inhibitor to treat. She did try, but could not tolerate due to her GI symptoms.  -I suggest her to see gastroenterologist for follow-up of her history of colitis, and the recent GI symptoms.  -I recommend her to switch to exemestane, starting in one month when she recovers better. If she still could not tolerate, will change to Tamoxifen then. -Continue breast cancer surveillance, with annual mammogram.  2. Colitis, recent gastric pain and diarrhea  -She will continue follow-up with her GI.  Dr. Cathlean Cower recently  retired, I encouraged her to call [facility] for appointment   3. History of B12 deficiency -She'll continue monthly B12 at her PCP office. She had a question about the relationship of B12 and her breast cancer, and I explained to her that there is no definitive evidence of correlation.   4. arthritis, depression -Follow up with her primary care physician   Plan: -RTC in one month. If she recovers well, will try Exemestane  -lab with next appointment  -follow up with GI   All questions were answered. The patient knows to call the clinic with any problems, questions or concerns. I spent 25 minutes counseling the patient face to face. The total time spent in the appointment was 30 minutes and more than 50% was on counseling.     Truitt Merle, MD 04/11/2015 9:36 PM

## 2015-04-11 NOTE — Telephone Encounter (Signed)
Gave and printed appt sched and avs fo rpt for Jan 2017

## 2015-04-13 ENCOUNTER — Telehealth: Payer: Self-pay | Admitting: Internal Medicine

## 2015-04-16 ENCOUNTER — Telehealth: Payer: Self-pay | Admitting: Internal Medicine

## 2015-04-16 ENCOUNTER — Encounter: Payer: Self-pay | Admitting: Gastroenterology

## 2015-04-16 NOTE — Telephone Encounter (Signed)
I believe she was followed by Dr. Olevia Perches Either way, ok to schedule with me and by the chart she is due ROV, next available

## 2015-04-16 NOTE — Telephone Encounter (Signed)
Error

## 2015-04-16 NOTE — Telephone Encounter (Signed)
Pt is sch'd with Cecille Rubin Hvozdovic on 04-19-15

## 2015-04-16 NOTE — Telephone Encounter (Signed)
Spoke with patient and she was placed on Femara for breast cancer. She started having rectal bleeding, cramps and diarrhea. Her oncologist stopped the medication but she has continued to have symptoms. Scheduled with Cecille Rubin Hvozdovic, PA-C on 04/19/15.

## 2015-04-16 NOTE — Telephone Encounter (Signed)
Pt said she is having alot of rectal bleeding and it has been going on for 2 weeks and diarrhea and stomach cramps Pt said Sam Max recommended that she should see Dr. Hilarie Fredrickson.  I sent Dr. Hilarie Fredrickson a message on 04-12-16 to switch from Dr. Olevia Perches to Dr. Hilarie Fredrickson but Patient is requesting to be seen by a P.A.

## 2015-04-18 ENCOUNTER — Inpatient Hospital Stay (HOSPITAL_COMMUNITY)
Admission: EM | Admit: 2015-04-18 | Discharge: 2015-04-27 | DRG: 385 | Disposition: A | Discharge status: Home / Self Care | Payer: Commercial Managed Care - HMO | Attending: Internal Medicine | Admitting: Internal Medicine

## 2015-04-18 ENCOUNTER — Observation Stay (HOSPITAL_COMMUNITY): Payer: Commercial Managed Care - HMO

## 2015-04-18 ENCOUNTER — Encounter (HOSPITAL_COMMUNITY): Payer: Self-pay | Admitting: Emergency Medicine

## 2015-04-18 DIAGNOSIS — T380X5A Adverse effect of glucocorticoids and synthetic analogues, initial encounter: Secondary | ICD-10-CM | POA: Diagnosis present

## 2015-04-18 DIAGNOSIS — C50311 Malignant neoplasm of lower-inner quadrant of right female breast: Secondary | ICD-10-CM | POA: Diagnosis present

## 2015-04-18 DIAGNOSIS — Z808 Family history of malignant neoplasm of other organs or systems: Secondary | ICD-10-CM

## 2015-04-18 DIAGNOSIS — D649 Anemia, unspecified: Secondary | ICD-10-CM | POA: Diagnosis not present

## 2015-04-18 DIAGNOSIS — K6289 Other specified diseases of anus and rectum: Secondary | ICD-10-CM | POA: Diagnosis not present

## 2015-04-18 DIAGNOSIS — Z841 Family history of disorders of kidney and ureter: Secondary | ICD-10-CM

## 2015-04-18 DIAGNOSIS — A419 Sepsis, unspecified organism: Secondary | ICD-10-CM | POA: Diagnosis present

## 2015-04-18 DIAGNOSIS — K610 Anal abscess: Secondary | ICD-10-CM | POA: Diagnosis present

## 2015-04-18 DIAGNOSIS — Z885 Allergy status to narcotic agent status: Secondary | ICD-10-CM

## 2015-04-18 DIAGNOSIS — K51014 Ulcerative (chronic) pancolitis with abscess: Secondary | ICD-10-CM | POA: Diagnosis present

## 2015-04-18 DIAGNOSIS — Z825 Family history of asthma and other chronic lower respiratory diseases: Secondary | ICD-10-CM | POA: Diagnosis not present

## 2015-04-18 DIAGNOSIS — Z882 Allergy status to sulfonamides status: Secondary | ICD-10-CM | POA: Diagnosis not present

## 2015-04-18 DIAGNOSIS — R1084 Generalized abdominal pain: Secondary | ICD-10-CM | POA: Diagnosis not present

## 2015-04-18 DIAGNOSIS — Z888 Allergy status to other drugs, medicaments and biological substances status: Secondary | ICD-10-CM

## 2015-04-18 DIAGNOSIS — K51011 Ulcerative (chronic) pancolitis with rectal bleeding: Secondary | ICD-10-CM | POA: Diagnosis not present

## 2015-04-18 DIAGNOSIS — R197 Diarrhea, unspecified: Secondary | ICD-10-CM | POA: Diagnosis not present

## 2015-04-18 DIAGNOSIS — E876 Hypokalemia: Secondary | ICD-10-CM | POA: Diagnosis present

## 2015-04-18 DIAGNOSIS — R509 Fever, unspecified: Secondary | ICD-10-CM | POA: Diagnosis not present

## 2015-04-18 DIAGNOSIS — K611 Rectal abscess: Secondary | ICD-10-CM | POA: Diagnosis not present

## 2015-04-18 DIAGNOSIS — D72829 Elevated white blood cell count, unspecified: Secondary | ICD-10-CM | POA: Diagnosis present

## 2015-04-18 DIAGNOSIS — K921 Melena: Secondary | ICD-10-CM | POA: Diagnosis not present

## 2015-04-18 DIAGNOSIS — C50312 Malignant neoplasm of lower-inner quadrant of left female breast: Secondary | ICD-10-CM

## 2015-04-18 DIAGNOSIS — E538 Deficiency of other specified B group vitamins: Secondary | ICD-10-CM | POA: Diagnosis not present

## 2015-04-18 DIAGNOSIS — K922 Gastrointestinal hemorrhage, unspecified: Secondary | ICD-10-CM | POA: Diagnosis present

## 2015-04-18 DIAGNOSIS — Z79899 Other long term (current) drug therapy: Secondary | ICD-10-CM

## 2015-04-18 DIAGNOSIS — K625 Hemorrhage of anus and rectum: Secondary | ICD-10-CM | POA: Diagnosis not present

## 2015-04-18 DIAGNOSIS — K612 Anorectal abscess: Secondary | ICD-10-CM | POA: Diagnosis present

## 2015-04-18 DIAGNOSIS — E860 Dehydration: Secondary | ICD-10-CM | POA: Diagnosis not present

## 2015-04-18 DIAGNOSIS — Z833 Family history of diabetes mellitus: Secondary | ICD-10-CM | POA: Diagnosis not present

## 2015-04-18 DIAGNOSIS — K51211 Ulcerative (chronic) proctitis with rectal bleeding: Principal | ICD-10-CM

## 2015-04-18 DIAGNOSIS — R502 Drug induced fever: Secondary | ICD-10-CM | POA: Diagnosis not present

## 2015-04-18 DIAGNOSIS — K51911 Ulcerative colitis, unspecified with rectal bleeding: Secondary | ICD-10-CM | POA: Diagnosis present

## 2015-04-18 LAB — COMPREHENSIVE METABOLIC PANEL
ALBUMIN: 3.3 g/dL — AB (ref 3.5–5.0)
ALK PHOS: 90 U/L (ref 38–126)
ALT: 12 U/L — ABNORMAL LOW (ref 14–54)
ANION GAP: 10 (ref 5–15)
AST: 20 U/L (ref 15–41)
BILIRUBIN TOTAL: 1.3 mg/dL — AB (ref 0.3–1.2)
BUN: 7 mg/dL (ref 6–20)
CALCIUM: 8.7 mg/dL — AB (ref 8.9–10.3)
CO2: 24 mmol/L (ref 22–32)
Chloride: 99 mmol/L — ABNORMAL LOW (ref 101–111)
Creatinine, Ser: 0.7 mg/dL (ref 0.44–1.00)
GLUCOSE: 110 mg/dL — AB (ref 65–99)
Potassium: 4 mmol/L (ref 3.5–5.1)
Sodium: 133 mmol/L — ABNORMAL LOW (ref 135–145)
TOTAL PROTEIN: 6.8 g/dL (ref 6.5–8.1)

## 2015-04-18 LAB — C DIFFICILE QUICK SCREEN W PCR REFLEX
C DIFFICILE (CDIFF) TOXIN: UNDETERMINED — AB
C DIFFICLE (CDIFF) ANTIGEN: UNDETERMINED — AB

## 2015-04-18 LAB — CLOSTRIDIUM DIFFICILE BY PCR: CDIFFPCR: NEGATIVE

## 2015-04-18 LAB — TYPE AND SCREEN
ABO/RH(D): A NEG
ANTIBODY SCREEN: NEGATIVE

## 2015-04-18 LAB — CBC
HCT: 40.1 % (ref 36.0–46.0)
HEMOGLOBIN: 13.4 g/dL (ref 12.0–15.0)
MCH: 31.5 pg (ref 26.0–34.0)
MCHC: 33.4 g/dL (ref 30.0–36.0)
MCV: 94.4 fL (ref 78.0–100.0)
Platelets: 276 10*3/uL (ref 150–400)
RBC: 4.25 MIL/uL (ref 3.87–5.11)
RDW: 12.6 % (ref 11.5–15.5)
WBC: 17.1 10*3/uL — AB (ref 4.0–10.5)

## 2015-04-18 LAB — POC OCCULT BLOOD, ED: Fecal Occult Bld: POSITIVE — AB

## 2015-04-18 MED ORDER — FLUOXETINE HCL 20 MG PO CAPS
20.0000 mg | ORAL_CAPSULE | Freq: Every day | ORAL | Status: DC
  Administered 2015-04-19 – 2015-04-27 (×9): 20 mg via ORAL
  Filled 2015-04-18 (×9): qty 1

## 2015-04-18 MED ORDER — SODIUM CHLORIDE 0.9 % IV SOLN
INTRAVENOUS | Status: DC
  Administered 2015-04-18 – 2015-04-21 (×6): via INTRAVENOUS
  Administered 2015-04-23: 1000 mL via INTRAVENOUS

## 2015-04-18 MED ORDER — ACETAMINOPHEN 325 MG PO TABS
650.0000 mg | ORAL_TABLET | Freq: Four times a day (QID) | ORAL | Status: DC | PRN
  Administered 2015-04-18 – 2015-04-27 (×4): 650 mg via ORAL
  Filled 2015-04-18 (×4): qty 2

## 2015-04-18 MED ORDER — HYDROCODONE-ACETAMINOPHEN 5-325 MG PO TABS: ORAL_TABLET | ORAL | Status: DC

## 2015-04-18 MED ORDER — ONDANSETRON HCL 4 MG PO TABS: 4.0000 mg | ORAL_TABLET | Freq: Four times a day (QID) | ORAL | Status: DC | PRN

## 2015-04-18 MED ORDER — ONDANSETRON HCL 4 MG/2ML IJ SOLN
4.0000 mg | Freq: Four times a day (QID) | INTRAMUSCULAR | Status: DC | PRN
Start: 2015-04-18 — End: 2015-04-27
  Administered 2015-04-19 – 2015-04-22 (×3): 4 mg via INTRAVENOUS
  Filled 2015-04-18 (×4): qty 2

## 2015-04-18 MED ORDER — FAMOTIDINE 20 MG PO TABS: 20.0000 mg | ORAL_TABLET | Freq: Two times a day (BID) | ORAL | Status: DC | PRN

## 2015-04-18 MED ORDER — ONDANSETRON HCL 4 MG/2ML IJ SOLN
4.0000 mg | Freq: Once | INTRAMUSCULAR | Status: AC
  Administered 2015-04-18: 4 mg via INTRAVENOUS
  Filled 2015-04-18: qty 2

## 2015-04-18 MED ORDER — HYDROMORPHONE HCL 1 MG/ML IJ SOLN
0.5000 mg | INTRAMUSCULAR | Status: DC | PRN
  Administered 2015-04-18 – 2015-04-25 (×18): 0.5 mg via INTRAVENOUS
  Filled 2015-04-18 (×19): qty 1

## 2015-04-18 MED ORDER — SODIUM CHLORIDE 0.9 % IV BOLUS (SEPSIS)
500.0000 mL | Freq: Once | INTRAVENOUS | Status: AC
Start: 2015-04-18 — End: 2015-04-18
  Administered 2015-04-18: 500 mL via INTRAVENOUS

## 2015-04-18 MED ORDER — ENOXAPARIN SODIUM 40 MG/0.4ML ~~LOC~~ SOLN: 40.0000 mg | SUBCUTANEOUS | Status: DC

## 2015-04-18 MED ORDER — IOHEXOL 300 MG/ML  SOLN: 25.0000 mL | Freq: Once | INTRAMUSCULAR | Status: DC | PRN

## 2015-04-18 MED ORDER — ZOLPIDEM TARTRATE 5 MG PO TABS
5.0000 mg | ORAL_TABLET | Freq: Every evening | ORAL | Status: DC | PRN
  Administered 2015-04-19 – 2015-04-26 (×7): 5 mg via ORAL
  Filled 2015-04-18 (×7): qty 1

## 2015-04-18 MED ORDER — MORPHINE SULFATE (PF) 4 MG/ML IV SOLN
4.0000 mg | Freq: Once | INTRAVENOUS | Status: AC
  Administered 2015-04-18: 4 mg via INTRAVENOUS
  Filled 2015-04-18: qty 1

## 2015-04-18 NOTE — ED Provider Notes (Signed)
The pt has hx of UC, has had rectal bleeding for the last month, does not appear unstable, VS normal - has had month of BRBPR, has been on prednisone for her neck but due to recent CA tx for BRCA with radiation had not paid much attention to her gi bleeding.  Now having frequent oozing.  Minimally tender LLQ, no other acute findings - see PA note for rectal exam.  D/w GI   Medical screening examination/treatment/procedure(s) were conducted as a shared visit with non-physician practitioner(s) and myself.  I personally evaluated the patient during the encounter.  Clinical Impression:   Final diagnoses:  Lower GI bleed  Dehydration  Rectal pain          , MD 04/18/15 2130 

## 2015-04-18 NOTE — ED Notes (Addendum)
Per EMS, pt having rectal bleeding x 2 weeks with bowel movements. Increasing diarrhea with more blood over the 2 weeks, dark and bright red. Pain 10/10 at rectum, with a 6/10 pain in lower abd. Hx of lumpectomy, finished radiation treatment recently. Pt states she's been taking a lot of ibuprofen recently to try to tolerate her neck pain/strain. Pt from home

## 2015-04-18 NOTE — ED Notes (Signed)
Bed: WA01 Expected date:  Expected time:  Means of arrival:  Comments: Ems-gi bleed

## 2015-04-18 NOTE — ED Notes (Signed)
Nurse drawing labs. 

## 2015-04-18 NOTE — ED Notes (Signed)
Per [place] in CT, scan will be around 515pm.  Made Heather RN on 3W aware of delay in transport to floor

## 2015-04-18 NOTE — ED Notes (Signed)
Pt drank cup of Oral CT contrast, pt able to tolerate orally, but had to go to restroom for BM.

## 2015-04-18 NOTE — Discharge Instructions (Signed)
Follow with them at [facility] gastroenterology your appointment tomorrow, they will follow-up the results of the C. difficile testing that was done today.  Do not hesitate to return to the Emergency Department for any new, worsening or concerning symptoms.   Take vicodin for breakthrough pain, do not drink alcohol, drive, care for children or do other critical tasks while taking vicodin.   Gastrointestinal Bleeding Gastrointestinal (GI) bleeding means there is bleeding somewhere along the digestive tract, between the mouth and anus. CAUSES  There are many different problems that can cause GI bleeding. Possible causes include:  Esophagitis. This is inflammation, irritation, or swelling of the esophagus.  Hemorrhoids.These are veins that are full of blood (engorged) in the rectum. They cause pain, inflammation, and may bleed.  Anal fissures.These are areas of painful tearing which may bleed. They are often caused by passing hard stool.  Diverticulosis.These are pouches that form on the colon over time, with age, and may bleed significantly.  Diverticulitis.This is inflammation in areas with diverticulosis. It can cause pain, fever, and bloody stools, although bleeding is rare.  Polyps and cancer. Colon cancer often starts out as precancerous polyps.  Gastritis and ulcers.Bleeding from the upper gastrointestinal tract (near the stomach) may travel through the intestines and produce black, sometimes tarry, often bad smelling stools. In certain cases, if the bleeding is fast enough, the stools may not be black, but red. This condition may be life-threatening. SYMPTOMS   Vomiting bright red blood or material that looks like coffee grounds.  Bloody, black, or tarry stools. DIAGNOSIS  Your caregiver may diagnose your condition by taking your history and performing a physical exam. More tests may be needed, including:  X-rays and other imaging tests.  Esophagogastroduodenoscopy (EGD).  This test uses a flexible, lighted tube to look at your esophagus, stomach, and small intestine.  Colonoscopy. This test uses a flexible, lighted tube to look at your colon. TREATMENT  Treatment depends on the cause of your bleeding.   For bleeding from the esophagus, stomach, small intestine, or colon, the caregiver doing your EGD or colonoscopy may be able to stop the bleeding as part of the procedure.  Inflammation or infection of the colon can be treated with medicines.  Many rectal problems can be treated with creams, suppositories, or warm baths.  Surgery is sometimes needed.  Blood transfusions are sometimes needed if you have lost a lot of blood. If bleeding is slow, you may be allowed to go home. If there is a lot of bleeding, you will need to stay in the hospital for observation. HOME CARE INSTRUCTIONS   Take any medicines exactly as prescribed.  Keep your stools soft by eating foods that are high in fiber. These foods include whole grains, legumes, fruits, and vegetables. Prunes (1 to 3 a day) work well for many people.  Drink enough fluids to keep your urine clear or pale yellow. SEEK IMMEDIATE MEDICAL CARE IF:   Your bleeding increases.  You feel lightheaded, weak, or you faint.  You have severe cramps in your back or abdomen.  You pass large blood clots in your stool.  Your problems are getting worse. MAKE SURE YOU:   Understand these instructions.  Will watch your condition.  Will get help right away if you are not doing well or get worse.   This information is not intended to replace advice given to you by your health care provider. Make sure you discuss any questions you have with your health care provider.  Document Released: 04/11/2000 Document Revised: 03/31/2012 Document Reviewed: 10/02/2014 Elsevier Interactive Patient Education [employer].

## 2015-04-18 NOTE — ED Provider Notes (Signed)
CSN: [id number]     Arrival date & time 04/18/15  1105 History   First MD Initiated Contact with Patient 04/18/15 1108     Chief Complaint  Patient presents with  . Rectal Bleeding  . Weakness     (Consider location/radiation/quality/duration/timing/severity/associated sxs/prior Treatment) HPI  Blood pressure 121/65, pulse 97, temperature 98.4 F (36.9 C), temperature source Oral, resp. rate 16, SpO2 97 %.  Sara Barnes is a 74 y.o. female with past medical history significant for ulcerative colitis, breast cancer in remission, no chemotherapy this was a surgical excision with radiation several months ago, complaining of bright red blood per rectum and diarrhea with darker than normal bowel movements proximally 12 per day worsening over the course of 2-1/2 weeks associated with tenesmus and severe rectal pain. She tried to make an appointment with her gastroenterologist Dr. Olevia Perches however, she is retired. She recently completed a prednisone taper, states she's been taking ibuprofen regularly for pain associated with the radiation therapy. States that she's never had to have a transfusion. Was on a course of amoxicillin for sinusitis in October. No anticoagulation. Patient denies chest pain, shortness of breath, palpitations. Patient had history of perirectal abscess, no fistula. States pain is severe, 10 out of 10 this is rectal pain, she denies endorses a mild lower abdominal pain notes a abdominal cramping that proceeds an episode of diarrhea.  Past Medical History  Diagnosis Date  . Vitamin B12 deficiency   . Ulcerative colitis   . Indigestion   . 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 Complication of anesthesia     anxiety post-op after ear surgeries  . PONV (postoperative nausea and vomiting)   . Family history of adverse reaction to anesthesia     pt's sister has hx. of post-op N/V  . Dental crowns present     recent root canal 11/2014  .  Runny nose 12/07/2014    clear drainage, per pt.  . Seasonal allergies    Past Surgical History  Procedure Laterality Date  . Abdominal hysterectomy      partial  . Appendectomy    . Breast enhancement surgery    . Incision and drainage perirectal abscess N/A 09/28/2013    Procedure: IRRIGATION AND DEBRIDEMENT PERIANAL ABSCESS, proctoscopy;  Surgeon: Shann Medal, MD;  Location: [facility];  Service: General;  Laterality: N/A;  . Breast lumpectomy Left     age 6's  . Inner ear surgery Bilateral   . Colonoscopy with propofol  05/28/2011; 06/28/2014  . Breast lumpectomy with radioactive seed and sentinel lymph node biopsy Left 12/13/2014    Procedure: LEFT BREAST LUMPECTOMY WITH RADIOACTIVE SEED AND SENTINEL LYMPH NODE BIOPSY;  Surgeon: [name], MD;  Location: [place];  Service: General;  Laterality: Left;   Family History  Problem Relation Age of Onset  . Anesthesia problems Sister     post-op N/V  . Prostate cancer Brother 14  . Kidney disease Brother   . Diabetes Brother   . Emphysema Maternal Grandfather   . Asthma Sister   . Brain cancer Father   . Rheum arthritis Mother   . Rheum arthritis Brother    Social History  Substance Use Topics  . Smoking status: Never Smoker   . Smokeless tobacco: Never Used  . Alcohol Use: Yes     Comment: daily wine with dinner   OB History    No data available  Review of Systems  10 systems reviewed and found to be negative, except as noted in the HPI.   Allergies  Codeine; Mesalamine; Other; Oxycodone-acetaminophen; and Sulfa antibiotics  Home Medications   Prior to Admission medications   Medication Sig Start Date End Date Taking? Authorizing Provider  acetaminophen (TYLENOL) 325 MG tablet Take 650 mg by mouth every 6 (six) hours as needed for moderate pain.    Yes Historical Provider, MD  cyanocobalamin (,VITAMIN B-12,) 1000 MCG/ML injection INJECT 1 ML (1,000 MCG TOTAL) INTO THE MUSCLE EVERY 30 (THIRTY)  DAYS. Patient taking differently: Inject 1,000 mcg into the muscle every 30 (thirty) days.  01/30/15  Yes Yvonne R Lowne, DO  famotidine (PEPCID) 20 MG tablet Take 20 mg by mouth 2 (two) times daily as needed for heartburn. Reported on 04/11/2015   Yes Historical Provider, MD  FLUoxetine (PROZAC) 20 MG capsule TAKE ONE CAPSULE BY MOUTH ONCE DAILY 03/20/15  Yes Alferd Apa Lowne, DO  ibuprofen (ADVIL,MOTRIN) 200 MG tablet Take 400 mg by mouth every 6 (six) hours as needed for mild pain (pain). Reported on 04/11/2015   Yes Historical Provider, MD  HYDROcodone-acetaminophen (NORCO/VICODIN) 5-325 MG tablet Take 1-2 tablets by mouth every 6 hours as needed for pain and/or cough. 04/18/15   Verita Kuroda, PA-C  letrozole (FEMARA) 2.5 MG tablet Take 1 tablet (2.5 mg total) by mouth daily. Patient not taking: Reported on 04/11/2015 03/01/15   Truitt Merle, MD   BP 128/56 mmHg  Pulse 86  Temp(Src) 98.4 F (36.9 C) (Oral)  Resp 19  SpO2 96% Physical Exam  Constitutional: She is oriented to person, place, and time. She appears well-developed and well-nourished. No distress.  HENT:  Head: Normocephalic and atraumatic.  Mouth/Throat: Oropharynx is clear and moist.  Very mild conjunctival pallor  Eyes: Conjunctivae and EOM are normal. Pupils are equal, round, and reactive to light.  Neck: Normal range of motion.  Cardiovascular: Normal rate, regular rhythm and intact distal pulses.   Pulmonary/Chest: Effort normal and breath sounds normal. No respiratory distress. She has no wheezes. She has no rales. She exhibits no tenderness.  Abdominal: Soft. There is no tenderness.  Genitourinary:  GU exam a chaperoned by nurses: No rashes or lesions, small external hemorrhoids not inflamed. Severe pain with insertion of finger into the rectum. There is no focal tenderness or fluctuance. Gross gross blood mixed with a scant amount of stool  Musculoskeletal: Normal range of motion.  Neurological: She is alert and  oriented to person, place, and time.  Skin: She is not diaphoretic.  Psychiatric: She has a normal mood and affect.  Nursing note and vitals reviewed.   ED Course  Procedures (including critical care time) Labs Review Labs Reviewed  COMPREHENSIVE METABOLIC PANEL - Abnormal; Notable for the following:    Sodium 133 (*)    Chloride 99 (*)    Glucose, Bld 110 (*)    Calcium 8.7 (*)    Albumin 3.3 (*)    ALT 12 (*)    Total Bilirubin 1.3 (*)    All other components within normal limits  CBC - Abnormal; Notable for the following:    WBC 17.1 (*)    All other components within normal limits  POC OCCULT BLOOD, ED - Abnormal; Notable for the following:    Fecal Occult Bld POSITIVE (*)    All other components within normal limits  C DIFFICILE QUICK SCREEN W PCR REFLEX  TYPE AND SCREEN  ABO/RH    Imaging  Review No results found. I have personally reviewed and evaluated these images and lab results as part of my medical decision-making.   EKG Interpretation None      MDM   Final diagnoses:  Lower GI bleed  Dehydration  Rectal pain    Filed Vitals:   04/18/15 1230 04/18/15 1300 04/18/15 1330 04/18/15 1400  BP: 135/77 126/68 140/65 128/56  Pulse:    86  Temp:      TempSrc:      Resp: 15 14 17 19   SpO2:    96%    Medications  morphine 4 MG/ML injection 4 mg (4 mg Intravenous Given 04/18/15 1158)  ondansetron (ZOFRAN) injection 4 mg (4 mg Intravenous Given 04/18/15 1158)    RAWAN RIENDEAU is 74 y.o. female presenting with 12 episodes of bloody diarrhea yesterday. Patient has ulcerative colitis, she has a history of perirectal abscess. Although she is very tender on digital rectal exam, there is no focal fluctuance. Large amount of gross blood on digital rectal exam. Vital signs reassuring with no tachycardia, blood pressure strong systolic over 852. Abdominal exam is nonsurgical. This patient is not anticoagulated, she never needed transfusion in the past.  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  Blood work shows no anemia, and given the quantity of blood and frequency of her bowel movements think it is reasonable to bring her in for serial hemoglobin, gastroenterology evaluation.  Case discussed with [facility] gastroenterologist APP Janett Billow:  Agrees with stability to discharge to home, request that we obtain C. Difficile  quick scan with reflex to PCR, this will be followed up in the office tomorrow.  This is a shared visit with the attending physician who personally evaluated the patient and agrees with the care plan.    Monico Blitz, PA-C 04/18/15 1524  Patient and her daughter state that she can't go home, states that she had a terrible night going up and down to the bathroom. I've explained to her that if she were to come into the hospital that she would still be going down to use the restroom. Patient and her daughter are adamant that she cannot go home, will discuss with hospitalist, Dr. Doyle Askew who accepts obs admission.   Monico Blitz, PA-C 04/18/15 1543    Monico Blitz, PA-C 04/18/15 1546  [place], PA-C 04/18/15 1551  Noemi Chapel, MD 04/18/15 2130

## 2015-04-18 NOTE — H&P (Addendum)
[facility] History and Physical  Sara Barnes QQI:[id number] DOB: 19-Mar-1941 DOA: 04/18/2015  Referring physician: ED PA, Elmyra Ricks  PCP: [name], DO   Chief Complaint: rectal bleeding, diarrhea   HPI:  74 y.o. female with known ulcerative colitis, breast cancer in remission, no chemotherapy (this was a surgical excision with radiation several months ago, was on Femara per Dr. Burr Medico)), presented to [facility] ED with main concern of more than two weeks duration of progressively worsening diarrhea initially with bright read blood in the stool but has become progressively darker in the past several days. Pt reports over 12 episodes per day. This has been associated with intermittent tenesmus, rectal pain that is sharp and 10/10 in severity when present, occasionally but not consistently radiating to lower abd quadrants and with no specific alleviating factors. Pt denies fevers chills, no chest pain or shortness of breath.   In ED, pt noted to be hemodynamically stable, VSS. TRH asked to admit for further evaluation.  Assessment and Plan:  Principal Problem:   Rectal bleeding with nausea and non bloody vomiting  - unclear etiology - last colonoscopy in March 2016 was notable for diffuse abnormal mucosa throughout the entire examined colon consistent with moderately active ulcerative pancolitis - GI consulted and will see pt in AM - for now place on IVF - analgesia and antiemetics as needed - NPO after midnight   Active Problems:   Ulcerative colitis ([place]) - per GI team, appreciate assistance     Cancer of lower-inner quadrant of left female breast [facility]) - will notify Dr. Burr Medico of pt's admission    Leukocytosis - unclear if related to acute illness vs recent use of Prednisone  - CBC in AM  DVT prophylaxis - SCD's  Radiological Exams on Admission: Ct Abdomen Pelvis Wo Contrast 04/18/2015 Scattered wall thickening of the colon from transverse colon through rectum, greatest at  sigmoid colon and rectum, with associated infiltrative changes of mesocolon consistent with patient history of ulcerative colitis. Unable to exclude perirectal/perianal inflammatory process posteriorly ; recommend correlation with physical exam and proctoscopy to exclude fistula and abscess. No evidence of perforation or abscess. Question tiny hiatal hernia. Minimal pericardial effusion.   Code Status: Full Family Communication: Pt and family at bedside Disposition Plan: Admit for further evaluation    [facility] [phone]   Review of Systems:  Constitutional: Negative for fever, chills. Negative for diaphoresis.  HENT: Negative for hearing loss, ear pain, nosebleeds, congestion, sore throat, neck pain, tinnitus and ear discharge.   Eyes: Negative for blurred vision, double vision, photophobia, pain, discharge and redness.  Respiratory: Negative for cough, hemoptysis, sputum production, shortness of breath, wheezing and stridor.   Cardiovascular: Negative for chest pain, palpitations, orthopnea, claudication and leg swelling.  Gastrointestinal: per HPI Genitourinary: Negative for dysuria, urgency, frequency, hematuria and flank pain.  Musculoskeletal: Negative for myalgias, back pain, joint pain and falls.  Skin: Negative for itching and rash.  Neurological: Negative for dizziness and weakness.  Endo/Heme/Allergies: Negative for environmental allergies and polydipsia. Does not bruise/bleed easily.  Psychiatric/Behavioral: Negative for suicidal ideas. The patient is not nervous/anxious.     Past Medical History  Diagnosis Date  . Vitamin B12 deficiency   . Ulcerative colitis   . Indigestion   . 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 Complication of anesthesia     anxiety post-op after ear surgeries  . PONV (postoperative nausea and vomiting)   . Family  history of adverse reaction to anesthesia     pt's sister has hx. of post-op N/V  . Dental  crowns present     recent root canal 11/2014  . Runny nose 12/07/2014    clear drainage, per pt.  . Seasonal allergies     Past Surgical History  Procedure Laterality Date  . Abdominal hysterectomy      partial  . Appendectomy    . Breast enhancement surgery    . Incision and drainage perirectal abscess N/A 09/28/2013    Procedure: IRRIGATION AND DEBRIDEMENT PERIANAL ABSCESS, proctoscopy;  Surgeon: Shann Medal, MD;  Location: [facility];  Service: General;  Laterality: N/A;  . Breast lumpectomy Left     age 50's  . Inner ear surgery Bilateral   . Colonoscopy with propofol  05/28/2011; 06/28/2014  . Breast lumpectomy with radioactive seed and sentinel lymph node biopsy Left 12/13/2014    Procedure: LEFT BREAST LUMPECTOMY WITH RADIOACTIVE SEED AND SENTINEL LYMPH NODE BIOPSY;  Surgeon: [name], MD;  Location: [place];  Service: General;  Laterality: Left;    Social History:  reports that she has never smoked. She has never used smokeless tobacco. She reports that she drinks alcohol. She reports that she does not use illicit drugs.  Allergies  Allergen Reactions  . Codeine Nausea And Vomiting  . Mesalamine Nausea And Vomiting  . Other Itching    PERFUMED LOTIONS  Cant have MRI due to ear implant   . Oxycodone-Acetaminophen Rash    Itching & rash  . Sulfa Antibiotics Rash    Family History  Problem Relation Age of Onset  . Anesthesia problems Sister     post-op N/V  . Prostate cancer Brother 55  . Kidney disease Brother   . Diabetes Brother   . Emphysema Maternal Grandfather   . Asthma Sister   . Brain cancer Father   . Rheum arthritis Mother   . Rheum arthritis Brother     Prior to Admission medications   Medication Sig Start Date End Date Taking? Authorizing Provider  acetaminophen (TYLENOL) 325 MG tablet Take 650 mg by mouth every 6 (six) hours as needed for moderate pain.    Yes Historical Provider, MD  cyanocobalamin (,VITAMIN B-12,) 1000 MCG/ML  injection INJECT 1 ML (1,000 MCG TOTAL) INTO THE MUSCLE EVERY 30 (THIRTY) DAYS. Patient taking differently: Inject 1,000 mcg into the muscle every 30 (thirty) days.  01/30/15  Yes Yvonne R Lowne, DO  famotidine (PEPCID) 20 MG tablet Take 20 mg by mouth 2 (two) times daily as needed for heartburn. Reported on 04/11/2015   Yes Historical Provider, MD  FLUoxetine (PROZAC) 20 MG capsule TAKE ONE CAPSULE BY MOUTH ONCE DAILY 03/20/15  Yes Alferd Apa Lowne, DO  ibuprofen (ADVIL,MOTRIN) 200 MG tablet Take 400 mg by mouth every 6 (six) hours as needed for mild pain (pain). Reported on 04/11/2015   Yes Historical Provider, MD  HYDROcodone-acetaminophen (NORCO/VICODIN) 5-325 MG tablet Take 1-2 tablets by mouth every 6 hours as needed for pain and/or cough. 04/18/15   Nicole Pisciotta, PA-C  letrozole (FEMARA) 2.5 MG tablet Take 1 tablet (2.5 mg total) by mouth daily. Patient not taking: Reported on 04/11/2015 03/01/15   Truitt Merle, MD    Physical Exam: Filed Vitals:   04/18/15 1330 04/18/15 1400 04/18/15 1654 04/18/15 1818  BP: 140/65 128/56 127/62 131/61  Pulse:  86 98 105  Temp:    98.7 F (37.1 C)  TempSrc:    Oral  Resp: 17 19 21 20   Height:    5' 6"  (1.676 m)  Weight:    68.7 kg (151 lb 7.3 oz)  SpO2:  96% 96% 95%    Physical Exam  Constitutional: Appears well-developed and well-nourished. No distress.  HENT: Normocephalic. External right and left ear normal. Dry MM Eyes: Conjunctivae and EOM are normal. PERRLA, no scleral icterus.  Neck: Normal ROM. Neck supple. No JVD. No tracheal deviation. No thyromegaly.  CVS: RRR, S1/S2 +, no murmurs, no gallops, no carotid bruit.  Pulmonary: Effort and breath sounds normal, no stridor, rhonchi, wheezes, rales.  Abdominal: Soft. BS +,  no distension, tenderness in lower abd quadrants, no rebound or guarding.  Musculoskeletal: Normal range of motion. No edema and no tenderness.  Lymphadenopathy: No lymphadenopathy noted, cervical, inguinal. Neuro: Alert.  Normal reflexes, muscle tone coordination. No cranial nerve deficit. Skin: Skin is warm and dry. No rash noted. Not diaphoretic. No erythema. No pallor.  Psychiatric: Normal mood and affect. Behavior, judgment, thought content normal.   Labs on Admission:  Basic Metabolic Panel:  Recent Labs Lab 04/18/15 1203  NA 133*  K 4.0  CL 99*  CO2 24  GLUCOSE 110*  BUN 7  CREATININE 0.70  CALCIUM 8.7*   Liver Function Tests:  Recent Labs Lab 04/18/15 1203  AST 20  ALT 12*  ALKPHOS 90  BILITOT 1.3*  PROT 6.8  ALBUMIN 3.3*   CBC:  Recent Labs Lab 04/18/15 1203  WBC 17.1*  HGB 13.4  HCT 40.1  MCV 94.4  PLT 276   EKG: pending    If 7PM-7AM, please contact night-coverage [email or web address] Password [facility] 04/18/2015, 6:36 PM

## 2015-04-19 ENCOUNTER — Encounter (HOSPITAL_COMMUNITY): Payer: Self-pay | Admitting: Radiology

## 2015-04-19 ENCOUNTER — Ambulatory Visit: Payer: Commercial Managed Care - HMO | Admitting: Physician Assistant

## 2015-04-19 ENCOUNTER — Inpatient Hospital Stay (HOSPITAL_COMMUNITY): Payer: Commercial Managed Care - HMO

## 2015-04-19 DIAGNOSIS — Z833 Family history of diabetes mellitus: Secondary | ICD-10-CM | POA: Diagnosis not present

## 2015-04-19 DIAGNOSIS — K625 Hemorrhage of anus and rectum: Secondary | ICD-10-CM | POA: Diagnosis present

## 2015-04-19 DIAGNOSIS — D649 Anemia, unspecified: Secondary | ICD-10-CM | POA: Diagnosis present

## 2015-04-19 DIAGNOSIS — Z885 Allergy status to narcotic agent status: Secondary | ICD-10-CM | POA: Diagnosis not present

## 2015-04-19 DIAGNOSIS — K51014 Ulcerative (chronic) pancolitis with abscess: Secondary | ICD-10-CM | POA: Diagnosis present

## 2015-04-19 DIAGNOSIS — E538 Deficiency of other specified B group vitamins: Secondary | ICD-10-CM | POA: Diagnosis present

## 2015-04-19 DIAGNOSIS — K6289 Other specified diseases of anus and rectum: Secondary | ICD-10-CM | POA: Diagnosis present

## 2015-04-19 DIAGNOSIS — K922 Gastrointestinal hemorrhage, unspecified: Secondary | ICD-10-CM | POA: Diagnosis not present

## 2015-04-19 DIAGNOSIS — E860 Dehydration: Secondary | ICD-10-CM | POA: Diagnosis present

## 2015-04-19 DIAGNOSIS — D72829 Elevated white blood cell count, unspecified: Secondary | ICD-10-CM | POA: Diagnosis present

## 2015-04-19 DIAGNOSIS — Z808 Family history of malignant neoplasm of other organs or systems: Secondary | ICD-10-CM | POA: Diagnosis not present

## 2015-04-19 DIAGNOSIS — A419 Sepsis, unspecified organism: Secondary | ICD-10-CM | POA: Diagnosis present

## 2015-04-19 DIAGNOSIS — R509 Fever, unspecified: Secondary | ICD-10-CM | POA: Diagnosis not present

## 2015-04-19 DIAGNOSIS — K612 Anorectal abscess: Secondary | ICD-10-CM | POA: Diagnosis present

## 2015-04-19 DIAGNOSIS — C50311 Malignant neoplasm of lower-inner quadrant of right female breast: Secondary | ICD-10-CM | POA: Diagnosis present

## 2015-04-19 DIAGNOSIS — Z79899 Other long term (current) drug therapy: Secondary | ICD-10-CM | POA: Diagnosis not present

## 2015-04-19 DIAGNOSIS — T380X5A Adverse effect of glucocorticoids and synthetic analogues, initial encounter: Secondary | ICD-10-CM | POA: Diagnosis present

## 2015-04-19 DIAGNOSIS — E876 Hypokalemia: Secondary | ICD-10-CM | POA: Diagnosis present

## 2015-04-19 DIAGNOSIS — Z841 Family history of disorders of kidney and ureter: Secondary | ICD-10-CM | POA: Diagnosis not present

## 2015-04-19 DIAGNOSIS — K611 Rectal abscess: Secondary | ICD-10-CM | POA: Diagnosis not present

## 2015-04-19 DIAGNOSIS — K51211 Ulcerative (chronic) proctitis with rectal bleeding: Secondary | ICD-10-CM | POA: Diagnosis present

## 2015-04-19 DIAGNOSIS — Z825 Family history of asthma and other chronic lower respiratory diseases: Secondary | ICD-10-CM | POA: Diagnosis not present

## 2015-04-19 DIAGNOSIS — K51011 Ulcerative (chronic) pancolitis with rectal bleeding: Secondary | ICD-10-CM | POA: Diagnosis not present

## 2015-04-19 DIAGNOSIS — K921 Melena: Secondary | ICD-10-CM | POA: Diagnosis present

## 2015-04-19 DIAGNOSIS — C50312 Malignant neoplasm of lower-inner quadrant of left female breast: Secondary | ICD-10-CM | POA: Diagnosis not present

## 2015-04-19 DIAGNOSIS — Z882 Allergy status to sulfonamides status: Secondary | ICD-10-CM | POA: Diagnosis not present

## 2015-04-19 DIAGNOSIS — Z888 Allergy status to other drugs, medicaments and biological substances status: Secondary | ICD-10-CM | POA: Diagnosis not present

## 2015-04-19 DIAGNOSIS — R502 Drug induced fever: Secondary | ICD-10-CM | POA: Diagnosis not present

## 2015-04-19 LAB — URINE MICROSCOPIC-ADD ON

## 2015-04-19 LAB — TSH: TSH: 1.746 u[IU]/mL (ref 0.350–4.500)

## 2015-04-19 LAB — URINALYSIS, ROUTINE W REFLEX MICROSCOPIC
BILIRUBIN URINE: NEGATIVE
GLUCOSE, UA: NEGATIVE mg/dL
KETONES UR: 40 mg/dL — AB
Nitrite: NEGATIVE
PH: 6 (ref 5.0–8.0)
Protein, ur: NEGATIVE mg/dL
Specific Gravity, Urine: 1.013 (ref 1.005–1.030)

## 2015-04-19 LAB — CBC
HEMATOCRIT: 41.1 % (ref 36.0–46.0)
Hemoglobin: 13.6 g/dL (ref 12.0–15.0)
MCH: 30.6 pg (ref 26.0–34.0)
MCHC: 33.1 g/dL (ref 30.0–36.0)
MCV: 92.4 fL (ref 78.0–100.0)
PLATELETS: 259 10*3/uL (ref 150–400)
RBC: 4.45 MIL/uL (ref 3.87–5.11)
RDW: 12.4 % (ref 11.5–15.5)
WBC: 20.1 10*3/uL — AB (ref 4.0–10.5)

## 2015-04-19 LAB — C DIFFICILE QUICK SCREEN W PCR REFLEX
C DIFFICILE (CDIFF) TOXIN: NEGATIVE
C DIFFICLE (CDIFF) ANTIGEN: NEGATIVE
C Diff interpretation: NEGATIVE

## 2015-04-19 LAB — GASTROINTESTINAL PANEL BY PCR, STOOL (REPLACES STOOL CULTURE)
ADENOVIRUS F40/41: NOT DETECTED
ASTROVIRUS: NOT DETECTED
CYCLOSPORA CAYETANENSIS: NOT DETECTED
Campylobacter species: NOT DETECTED
Cryptosporidium: NOT DETECTED
E. coli O157: NOT DETECTED
ENTAMOEBA HISTOLYTICA: NOT DETECTED
ENTEROAGGREGATIVE E COLI (EAEC): NOT DETECTED
ENTEROPATHOGENIC E COLI (EPEC): NOT DETECTED
ENTEROTOXIGENIC E COLI (ETEC): NOT DETECTED
GIARDIA LAMBLIA: NOT DETECTED
NOROVIRUS GI/GII: NOT DETECTED
Plesimonas shigelloides: NOT DETECTED
Rotavirus A: NOT DETECTED
SAPOVIRUS (I, II, IV, AND V): NOT DETECTED
Salmonella species: NOT DETECTED
Shiga like toxin producing E coli (STEC): NOT DETECTED
Shigella/Enteroinvasive E coli (EIEC): NOT DETECTED
VIBRIO CHOLERAE: NOT DETECTED
VIBRIO SPECIES: NOT DETECTED
Yersinia enterocolitica: NOT DETECTED

## 2015-04-19 LAB — BASIC METABOLIC PANEL
Anion gap: 11 (ref 5–15)
BUN: 7 mg/dL (ref 6–20)
CHLORIDE: 101 mmol/L (ref 101–111)
CO2: 23 mmol/L (ref 22–32)
CREATININE: 0.77 mg/dL (ref 0.44–1.00)
Calcium: 8.6 mg/dL — ABNORMAL LOW (ref 8.9–10.3)
GFR calc Af Amer: >60 mL/min (ref >60)
GFR calc non Af Amer: >60 mL/min (ref >60)
GLUCOSE: 127 mg/dL — AB (ref 65–99)
POTASSIUM: 3.3 mmol/L — AB (ref 3.5–5.1)
SODIUM: 135 mmol/L (ref 135–145)

## 2015-04-19 LAB — INFLUENZA PANEL BY PCR (TYPE A & B)
H1N1 flu by pcr: NOT DETECTED
INFLAPCR: NEGATIVE
INFLBPCR: NEGATIVE

## 2015-04-19 LAB — ABO/RH: ABO/RH(D): A NEG

## 2015-04-19 MED ORDER — CIPROFLOXACIN IN D5W 400 MG/200ML IV SOLN
400.0000 mg | Freq: Two times a day (BID) | INTRAVENOUS | Status: DC
  Administered 2015-04-19 – 2015-04-25 (×14): 400 mg via INTRAVENOUS
  Filled 2015-04-19 (×14): qty 200

## 2015-04-19 MED ORDER — METHYLPREDNISOLONE SODIUM SUCC 40 MG IJ SOLR
20.0000 mg | INTRAMUSCULAR | Status: DC
  Administered 2015-04-19 – 2015-04-24 (×6): 20 mg via INTRAVENOUS
  Filled 2015-04-19 (×7): qty 1

## 2015-04-19 MED ORDER — PIPERACILLIN-TAZOBACTAM 3.375 G IVPB: 3.3750 g | Freq: Three times a day (TID) | INTRAVENOUS | Status: DC

## 2015-04-19 MED ORDER — METRONIDAZOLE IN NACL 5-0.79 MG/ML-% IV SOLN
500.0000 mg | Freq: Three times a day (TID) | INTRAVENOUS | Status: DC
  Administered 2015-04-19 – 2015-04-26 (×21): 500 mg via INTRAVENOUS
  Filled 2015-04-19 (×21): qty 100

## 2015-04-19 MED ORDER — POTASSIUM CHLORIDE CRYS ER 20 MEQ PO TBCR
40.0000 meq | EXTENDED_RELEASE_TABLET | Freq: Once | ORAL | Status: AC
  Administered 2015-04-19: 40 meq via ORAL
  Filled 2015-04-19: qty 2

## 2015-04-19 MED ORDER — IOHEXOL 300 MG/ML  SOLN: 25.0000 mL | INTRAMUSCULAR | Status: AC

## 2015-04-19 MED ORDER — IOHEXOL 300 MG/ML  SOLN
100.0000 mL | Freq: Once | INTRAMUSCULAR | Status: AC | PRN
  Administered 2015-04-19: 100 mL via INTRAVENOUS

## 2015-04-19 NOTE — Progress Notes (Signed)
Patient ID: Sara Barnes, female   DOB: 02-May-1940, 74 y.o.   MRN: [id number]  [facility] PROGRESS NOTE  Sara Barnes BHA:[id number] DOB: [birth year] DOA: 04/18/2015 PCP: Sara Koyanagi, DO   Brief narrative:    74 y.o. female with known ulcerative colitis, breast cancer in remission, no chemotherapy (this was a surgical excision with radiation several months ago, was on Femara per Dr. Burr Barnes)), presented to [facility] ED with main concern of more than two weeks duration of progressively worsening diarrhea initially with bright read blood in the stool but has become progressively darker in the past several days. Pt reports over 12 episodes per day. This has been associated with intermittent tenesmus, rectal pain that is sharp and 10/10 in severity when present, occasionally but not consistently radiating to lower abd quadrants and with no specific alleviating factors. Pt denies fevers chills, no chest pain or shortness of breath.   In ED, pt noted to be hemodynamically stable, VSS. TRH asked to admit for further evaluation.  Assessment/Plan:    Principal Problem:  Rectal bleeding with N/V/D - C. Diff by PCR indeterminate but since pt with T 101.2 and HR > 90, WBC up to 20 K from 17 K on admission, persistent diarrhea, will start pt on Cipro and Flagyl this AM, discuss with GI team if Cipro is need or Flagyl alone is sufficient  - last colonoscopy in March 2016 was notable for diffuse abnormal mucosa throughout the entire examined colon consistent with moderately active ulcerative pancolitis - GI consulted and will see pt this AM  Active Problems:   Sepsis, ? Secondary to C. Diff colitis  - pt did not meet criteria for sepsis on admission but this AM T is 101.2 F with HR > 90 and WBC up to 20 K - started on flagyl as noted above and will discuss with GI team further plans     Hypokalemia - supplement and repeat BMP in AM   Ulcerative colitis ([place]) - per GI team, appreciate assistance     Cancer of lower-inner quadrant of left female breast [facility]) - notified Dr. Burr Barnes of pt's admission   Leukocytosis - unclear if related to acute illness and imposed on recent use of Prednisone  - CBC in AM  DVT prophylaxis - SCD's  Code Status: Full.  Family Communication:  plan of care discussed with the patient and her sister at bedside  Disposition Plan: Home when cleared by GI team   IV access:  Peripheral IV  Procedures and diagnostic studies:    Ct Abdomen Pelvis Wo Contrast 04/18/2015 Scattered wall thickening of the colon from transverse colon through rectum, greatest at sigmoid colon and rectum, with associated infiltrative changes of mesocolon consistent with patient history of ulcerative colitis. Unable to exclude perirectal/perianal inflammatory process posteriorly ; recommend correlation with physical exam and proctoscopy to exclude fistula and abscess. No evidence of perforation or abscess. Question tiny hiatal hernia. Minimal pericardial effusion.   Dg Chest Port 1 View 04/19/2015  Negative for pneumonia.   Medical Consultants:  GI  Other Consultants:  None  IAnti-Infectives:   Flagyl 12/22 --> Cipro 12/22 -->  Sara Ramsay, MD  [facility] Pager [phone]  If 7PM-7AM, please contact night-coverage [email or web address] Password [facility] 04/19/2015, 8:33 AM     HPI/Subjective: No events overnight. Persistent diarrhea.   Objective: Filed Vitals:   04/18/15 1818 04/18/15 2140 04/19/15 0036 04/19/15 0530  BP: 131/61 132/64  126/68  Pulse: 105 101  90  Temp: 98.7  F (37.1 C) 101.2 F (38.4 C) 98.9 F (37.2 C) 99 F (37.2 C)  TempSrc: Oral Oral  Oral  Resp: 20 20  18   Height: 5' 6"  (1.676 m)     Weight: 68.7 kg (151 lb 7.3 oz)     SpO2: 95% 94%  96%    Intake/Output Summary (Last 24 hours) at 04/19/15 5631 Last data filed at 04/19/15 0600  Gross per 24 hour  Intake 821.25 ml  Output   1100 ml  Net -278.75 ml    Exam:   General:  Pt is alert,  follows commands appropriately, not in acute distress  Cardiovascular: Regular rate and rhythm, S1/S2, no murmurs, no rubs, no gallops  Respiratory: Clear to auscultation bilaterally, no wheezing, no crackles, no rhonchi  Abdomen: Soft, tender in lower abd quadrants, non distended, bowel sounds present, no guarding  Extremities: No edema, pulses DP and PT palpable bilaterally  Neuro: Grossly nonfocal  Data Reviewed: Basic Metabolic Panel:  Recent Labs Lab 04/18/15 1203 04/19/15 0349  NA 133* 135  K 4.0 3.3*  CL 99* 101  CO2 24 23  GLUCOSE 110* 127*  BUN 7 7  CREATININE 0.70 0.77  CALCIUM 8.7* 8.6*   Liver Function Tests:  Recent Labs Lab 04/18/15 1203  AST 20  ALT 12*  ALKPHOS 90  BILITOT 1.3*  PROT 6.8  ALBUMIN 3.3*   No results for input(s): LIPASE, AMYLASE in the last 168 hours. No results for input(s): AMMONIA in the last 168 hours. CBC:  Recent Labs Lab 04/18/15 1203 04/19/15 0349  WBC 17.1* 20.1*  HGB 13.4 13.6  HCT 40.1 41.1  MCV 94.4 92.4  PLT 276 259   Cardiac Enzymes: No results for input(s): CKTOTAL, CKMB, CKMBINDEX, TROPONINI in the last 168 hours. BNP: Invalid input(s): POCBNP CBG: No results for input(s): GLUCAP in the last 168 hours.  Recent Results (from the past 240 hour(s))  C difficile quick scan w PCR reflex     Status: Abnormal   Collection Time: 04/18/15  4:38 PM  Result Value Ref Range Status   C Diff antigen INDETERMINATE (A) NEGATIVE Final   C Diff toxin INDETERMINATE (A) NEGATIVE Final   C Diff interpretation Results are indeterminate. See PCR results.  Final  Clostridium Difficile by PCR     Status: None   Collection Time: 04/18/15  4:38 PM  Result Value Ref Range Status   Toxigenic C Difficile by pcr NEGATIVE NEGATIVE Final  Culture, blood (Routine X 2) w Reflex to ID Panel     Status: None (Preliminary result)   Collection Time: 04/19/15 12:18 AM  Result Value Ref Range Status   Specimen Description   Final     BLOOD RIGHT HAND Performed at [facility]    Special Requests BOTTLES DRAWN AEROBIC AND ANAEROBIC 5CC  Final   Culture PENDING  Incomplete   Report Status PENDING  Incomplete     Scheduled Meds: . ciprofloxacin  400 mg Intravenous Q12H  . FLUoxetine  20 mg Oral Daily  . metronidazole  500 mg Intravenous Q8H   Continuous Infusions: . sodium chloride 75 mL/hr at 04/19/15 0329

## 2015-04-19 NOTE — Care Management Note (Signed)
Case Management Note  Patient Details  Name: Sara Barnes MRN: [id number] Date of Birth: 1941/04/11  Subjective/Objective:          74 yo admitted with Rectal bleeding. Hx of ulcerative colitis since 1972, breast cancer in remission           Action/Plan: From home alone. Pt is independent with ADLs. Depending on progress pt could benefit from PT/OT eval to assist with DC needs. CM will continue to follow.  Expected Discharge Date:   (unknown)               Expected Discharge Plan:  Home/Self Care  In-House Referral:     Discharge planning Services  CM Consult  Post Acute Care Choice:    Choice offered to:     DME Arranged:    DME Agency:     HH Arranged:    HH Agency:     Status of Service:  In process, will continue to follow  Medicare Important Message Given:    Date Medicare IM Given:    Medicare IM give by:    Date Additional Medicare IM Given:    Additional Medicare Important Message give by:     If discussed at [place] of Stay Meetings, dates discussed:    Additional Comments:  Lynnell Catalan, RN 04/19/2015, 1:13 PM

## 2015-04-19 NOTE — Consult Note (Signed)
Referring Provider: Dr. Doyle Askew Primary Care Physician:  [name], DO Primary Gastroenterologist:  Dr. Olevia Perches  Reason for Consultation:  Ulcerative colitis  HPI: Sara Barnes is a 74 y.o. female with known ulcerative colitis since 1972, breast cancer in remission (just diagnosed in August of this year); no chemotherapy, this was a surgical excision with radiation, was on Femara per Dr. Burr Medico until recently.  She presented to [facility] ED with main concern of more than two weeks duration of progressively worsening diarrhea initially with bright read blood in the stool but has become progressively darker in the past several days. Pt reports over 12 episodes per day. This has been associated with intermittent tenesmus, rectal pain that is sharp and 10/10 in severity when present, occasionally but not consistently radiating to lower abd quadrants and with no specific alleviating factors.  Has not been on any medication for her colitis since September when she discontinued her low-dose prednisone and sulfasalazine (says that it makes her sick and she is technically allergic to it but was taking it previously in conjunction with prednisone, I guess to counteract allergic reaction).  CT scan of the abdomen and pelvis without contrast showed the following:  IMPRESSION: Scattered wall thickening of the colon from transverse colon through rectum, greatest at sigmoid colon and rectum, with associated infiltrative changes of mesocolon consistent with patient history of ulcerative colitis. Unable to exclude perirectal/perianal inflammatory process posteriorly; recommend correlation with physical exam and proctoscopy to exclude fistula and abscess. No evidence of perforation or abscess. Question tiny hiatal hernia. Minimal pericardial effusion.  Has a leukocytosis.  Was started on Zosyn, cipro, and flagyl.  Last colonoscopy 06/2014 by Dr. 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  Is  allergic to mesalamine.  Last visit with Dr. Olevia Perches 09/2014 she had been taking Sulfasalazine and very low dose prednisone and was doing well.  She stopped these in September and says that she feels like the sulfasalazine was making her sick.  Past Medical History  Diagnosis Date  . Vitamin B12 deficiency   . Ulcerative colitis   . Indigestion   . Cancer of lower-inner quadrant of female breast ([place]) 11/30/2014    left  . Complication of anesthesia     anxiety post-op after ear surgeries  . PONV (postoperative nausea and vomiting)   . Family history of adverse reaction to anesthesia     pt's sister has hx. of post-op N/V  . Dental crowns present     recent root canal 11/2014  . Runny nose 12/07/2014    clear drainage, per pt.  . Seasonal allergies     Past Surgical History  Procedure Laterality Date  . Abdominal hysterectomy      partial  . Appendectomy    . Breast enhancement surgery    . Incision and drainage perirectal abscess N/A 09/28/2013    Procedure: IRRIGATION AND DEBRIDEMENT PERIANAL ABSCESS, proctoscopy;  Surgeon: Shann Medal, MD;  Location: [facility];  Service: General;  Laterality: N/A;  . Breast lumpectomy Left     age 25's  . Inner ear surgery Bilateral   . Colonoscopy with propofol  05/28/2011; 06/28/2014  . Breast lumpectomy with radioactive seed and sentinel lymph node biopsy Left 12/13/2014    Procedure: LEFT BREAST LUMPECTOMY WITH RADIOACTIVE SEED AND SENTINEL LYMPH NODE BIOPSY;  Surgeon: [name], MD;  Location: [place];  Service: General;  Laterality: Left;    Prior to Admission medications   Medication  Sig Start Date End Date Taking? Authorizing Provider  acetaminophen (TYLENOL) 325 MG tablet Take 650 mg by mouth every 6 (six) hours as needed for moderate pain.    Yes Historical Provider, MD  cyanocobalamin (,VITAMIN B-12,) 1000 MCG/ML injection INJECT 1 ML (1,000 MCG TOTAL) INTO THE MUSCLE EVERY 30 (THIRTY) DAYS. Patient taking  differently: Inject 1,000 mcg into the muscle every 30 (thirty) days.  01/30/15  Yes Yvonne R Lowne, DO  famotidine (PEPCID) 20 MG tablet Take 20 mg by mouth 2 (two) times daily as needed for heartburn. Reported on 04/11/2015   Yes Historical Provider, MD  FLUoxetine (PROZAC) 20 MG capsule TAKE ONE CAPSULE BY MOUTH ONCE DAILY 03/20/15  Yes Alferd Apa Lowne, DO  ibuprofen (ADVIL,MOTRIN) 200 MG tablet Take 400 mg by mouth every 6 (six) hours as needed for mild pain (pain). Reported on 04/11/2015   Yes Historical Provider, MD  HYDROcodone-acetaminophen (NORCO/VICODIN) 5-325 MG tablet Take 1-2 tablets by mouth every 6 hours as needed for pain and/or cough. 04/18/15   Nicole Pisciotta, PA-C  letrozole (FEMARA) 2.5 MG tablet Take 1 tablet (2.5 mg total) by mouth daily. Patient not taking: Reported on 04/11/2015 03/01/15   Truitt Merle, MD    Current Facility-Administered Medications  Medication Dose Route Frequency Provider Last Rate Last Dose  . 0.9 %  sodium chloride infusion   Intravenous Continuous Theodis Blaze, MD 75 mL/hr at 04/19/15 0329    . acetaminophen (TYLENOL) tablet 650 mg  650 mg Oral Q6H PRN Theodis Blaze, MD   650 mg at 04/18/15 2307  . ciprofloxacin (CIPRO) IVPB 400 mg  400 mg Intravenous BID Theodis Blaze, MD      . famotidine (PEPCID) tablet 20 mg  20 mg Oral BID PRN Theodis Blaze, MD      . FLUoxetine (PROZAC) capsule 20 mg  20 mg Oral Daily Theodis Blaze, MD      . HYDROmorphone (DILAUDID) injection 0.5 mg  0.5 mg Intravenous Q4H PRN Theodis Blaze, MD   0.5 mg at 04/19/15 0840  . iohexol (OMNIPAQUE) 300 MG/ML solution 25 mL  25 mL Oral Once PRN Wenda Overland Little, MD      . metroNIDAZOLE (FLAGYL) IVPB 500 mg  500 mg Intravenous 3 times per day Theodis Blaze, MD      . ondansetron [facility]) tablet 4 mg  4 mg Oral Q6H PRN Theodis Blaze, MD       Or  . ondansetron [facility]) injection 4 mg  4 mg Intravenous Q6H PRN Theodis Blaze, MD   4 mg at 04/19/15 0330  . potassium chloride SA  (K-DUR,KLOR-CON) CR tablet 40 mEq  40 mEq Oral Once Theodis Blaze, MD      . zolpidem [facility]) tablet 5 mg  5 mg Oral QHS PRN Theodis Blaze, MD        Allergies as of 04/18/2015 - Review Complete 04/18/2015  Allergen Reaction Noted  . Codeine Nausea And Vomiting 01/20/2008  . Mesalamine Nausea And Vomiting 05/13/2011  . Other Itching 12/07/2014  . Oxycodone-acetaminophen Rash 01/04/2015  . Sulfa antibiotics Rash 12/07/2014    Family History  Problem Relation Age of Onset  . Anesthesia problems Sister     post-op N/V  . Prostate cancer Brother 62  . Kidney disease Brother   . Diabetes Brother   . Emphysema Maternal Grandfather   . Asthma Sister   . Brain cancer Father   . Rheum arthritis  Mother   . Rheum arthritis Brother     Social History   Social History  . Marital Status: Widowed    Spouse Name: N/A  . Number of Children: N/A  . Years of Education: N/A   Occupational History  . 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 Smoking status: Never Smoker   . Smokeless tobacco: Never Used  . Alcohol Use: Yes     Comment: daily wine with dinner  . Drug Use: No  . Sexual Activity:    Partners: Male   Other Topics Concern  . Not on file   Social History Narrative   Treadmill/ walking--exercise    Review of Systems: Ten point ROS is O/W negative except as mentioned in HPI.  Physical Exam: Vital signs in last 24 hours: Temp:  [98.4 F (36.9 C)-101.2 F (38.4 C)] 99 F (37.2 C) (12/22 0530) Pulse Rate:  [86-105] 90 (12/22 0530) Resp:  [14-21] 18 (12/22 0530) BP: (121-141)/(56-77) 126/68 mmHg (12/22 0530) SpO2:  [94 %-97 %] 96 % (12/22 0530) Weight:  [151 lb 7.3 oz (68.7 kg)] 151 lb 7.3 oz (68.7 kg) (12/21 1818) Last BM Date: 04/19/15 General:  Alert, Well-developed, well-nourished, pleasant and cooperative in NAD Head:  Normocephalic and atraumatic. Eyes:  Sclera clear, no icterus.  Conjunctiva pink. Ears:  Normal auditory  acuity. Mouth:  No deformity or lesions.   Lungs:  Clear throughout to auscultation.  No wheezes, crackles, or rhonchi.  Heart:  Regular rate and rhythm; no murmurs, clicks, rubs, or gallops. Abdomen:  Soft, non-distended.  BS present.  Moderate lower abdominal TTP. Rectal:  Deferred.  Msk:  Symmetrical without gross deformities. Pulses:  Normal pulses noted. Extremities:  Without clubbing or edema. Neurologic:  Alert and oriented x 4;  grossly normal neurologically. Skin:  Intact without significant lesions or rashes. Psych:  Alert and cooperative. Normal mood and affect.  Intake/Output from previous day: 12/21 0701 - 12/22 0700 In: 821.3 [I.V.:821.3] Out: 1100 [Urine:1100]  Lab Results:  Recent Labs  04/18/15 1203 04/19/15 0349  WBC 17.1* 20.1*  HGB 13.4 13.6  HCT 40.1 41.1  PLT 276 259   BMET  Recent Labs  04/18/15 1203 04/19/15 0349  NA 133* 135  K 4.0 3.3*  CL 99* 101  CO2 24 23  GLUCOSE 110* 127*  BUN 7 7  CREATININE 0.70 0.77  CALCIUM 8.7* 8.6*   LFT  Recent Labs  04/18/15 1203  PROT 6.8  ALBUMIN 3.3*  AST 20  ALT 12*  ALKPHOS 90  BILITOT 1.3*   Studies/Results: Ct Abdomen Pelvis Wo Contrast  04/18/2015  CLINICAL DATA:  Rectal bleeding for 2 weeks with bowel movements, increasing diarrhea with more blood over this 2 week period, both dark and bright red blood, pain 10/10 at rectum and 6/10 at lower abdomen ; history breast cancer post lumpectomy and radiation therapy, ulcer colitis, irritable bowel syndrome EXAM: CT ABDOMEN AND PELVIS WITHOUT CONTRAST TECHNIQUE: Multidetector CT imaging of the abdomen and pelvis was performed following the standard protocol without IV contrast. Sagittal and coronal MPR images reconstructed from axial data set. Patient drank dilute oral contrast for exam. COMPARISON:  09/27/2013 FINDINGS: Minimal dependent atelectasis at lung bases. BILATERAL breast prostheses. Minimal pericardial effusion, increased. Within limits of  a nonenhanced exam, liver, spleen, gallbladder, pancreas, kidneys, and adrenal glands normal. Uterus and appendix surgically absent by history with normal sized ovaries. Diffuse bowel wall thickening of  rectum, portions of sigmoid colon, descending colon and transverse colon compatible with ulcerative colitis. Hyperemia of mesocolon with numerous scattered normal sized pericolic lymph nodes. Greatest degree of wall thickening appears to be at the rectum and sigmoid colon. Minimal infiltration of pericolic and perirectal fat. Findings consistent with colitis and patient history of ulcerative colitis. Posteriorly at the rectum, asymmetric soft tissue infiltration and single focus of gas are identified, uncertain if related to the distal rectum/anus or concomitant perirectal/perianal disease such as fistula or abscess. Proximal colon and small bowel loops unremarkable. Question tiny hiatal hernia, stomach otherwise normal. Bladder and ureters unremarkable. No mass, adenopathy, free air, or free fluid. Mild scattered degenerative disc disease changes of the thoracolumbar spine. IMPRESSION: Scattered wall thickening of the colon from transverse colon through rectum, greatest at sigmoid colon and rectum, with associated infiltrative changes of mesocolon consistent with patient history of ulcerative colitis. Unable to exclude perirectal/perianal inflammatory process posteriorly ; recommend correlation with physical exam and proctoscopy to exclude fistula and abscess. No evidence of perforation or abscess. Question tiny hiatal hernia. Minimal pericardial effusion. Electronically Signed   By: Lavonia Dana M.D.   On: 04/18/2015 17:27   Dg Chest Port 1 View  04/19/2015  CLINICAL DATA:  Sudden onset fever EXAM: PORTABLE CHEST 1 VIEW COMPARISON:  10/22/2011 FINDINGS: No cardiomegaly. Negative aortic and hilar contours. A hazy density at the cardiac apex is fat pad based on 2013 chest CT. There is no edema, consolidation,  effusion, or pneumothorax. Bilateral breast implants. IMPRESSION: Negative for pneumonia. Electronically Signed   By: Monte Fantasia M.D.   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  Now with flare of symptoms with diarrhea, rectal bleeding, rectal pain, and abdominal cramps.  Also has leukocytosis.  Cdiff negative.  Has history of pelvic abscess related to disease in 2015.  PLAN: -Will repeat CT scan of the pelvis with contrast to rule out abscess since she has a history of the same.  If negative then will start IV solumedrol. -Will discontinue Zosyn, but can leave on Cipro and flagyl for now.  -? Balsalazide for maintenance vs immuno-modulators vs biologics.  Almeter Westhoff D.  04/19/2015, 9:12 AM  Pager number [phone]

## 2015-04-19 NOTE — Progress Notes (Signed)
Pt had a couple of loose stools throughout shift. Rn think pt will benefit from a rectal tube. But pt think she is too raw at the rectum  and will not be able to stand the pain. Education was done, but pt states she does not feel ready for that.  And will let the nurse know when she is ready.

## 2015-04-20 DIAGNOSIS — K51014 Ulcerative (chronic) pancolitis with abscess: Secondary | ICD-10-CM | POA: Diagnosis present

## 2015-04-20 DIAGNOSIS — A419 Sepsis, unspecified organism: Secondary | ICD-10-CM

## 2015-04-20 LAB — CBC
HCT: 34.8 % — ABNORMAL LOW (ref 36.0–46.0)
HEMOGLOBIN: 11.7 g/dL — AB (ref 12.0–15.0)
MCH: 30.7 pg (ref 26.0–34.0)
MCHC: 33.6 g/dL (ref 30.0–36.0)
MCV: 91.3 fL (ref 78.0–100.0)
Platelets: 230 10*3/uL (ref 150–400)
RBC: 3.81 MIL/uL — AB (ref 3.87–5.11)
RDW: 12.5 % (ref 11.5–15.5)
WBC: 12.9 10*3/uL — AB (ref 4.0–10.5)

## 2015-04-20 LAB — BASIC METABOLIC PANEL
ANION GAP: 7 (ref 5–15)
BUN: 6 mg/dL (ref 6–20)
CHLORIDE: 107 mmol/L (ref 101–111)
CO2: 23 mmol/L (ref 22–32)
Calcium: 8.6 mg/dL — ABNORMAL LOW (ref 8.9–10.3)
Creatinine, Ser: 0.65 mg/dL (ref 0.44–1.00)
GFR calc Af Amer: >60 mL/min (ref >60)
GFR calc non Af Amer: >60 mL/min (ref >60)
Glucose, Bld: 122 mg/dL — ABNORMAL HIGH (ref 65–99)
POTASSIUM: 3.5 mmol/L (ref 3.5–5.1)
SODIUM: 137 mmol/L (ref 135–145)

## 2015-04-20 NOTE — Progress Notes (Signed)
Patient ID: Sara Barnes, female   DOB: 1940-12-18, 74 y.o.   MRN: [id number]  [facility] PROGRESS NOTE  Sara Barnes WEX:[id number] DOB: 22-Jun-1940 DOA: 04/18/2015 PCP: [name], DO   Brief narrative:    74 y.o. female with known ulcerative colitis, breast cancer in remission, no chemotherapy (this was a surgical excision with radiation several months ago, was on Femara per Dr. Burr Medico)), presented to [facility] ED with main concern of more than two weeks duration of progressively worsening diarrhea initially with bright read blood in the stool but has become progressively darker in the past several days. Pt reports over 12 episodes per day. This has been associated with intermittent tenesmus, rectal pain that is sharp and 10/10 in severity when present, occasionally but not consistently radiating to lower abd quadrants and with no specific alleviating factors. Pt denies fevers chills, no chest pain or shortness of breath.   In ED, pt noted to be hemodynamically stable, VSS. [facility] asked to admit for further evaluation.  Assessment/Plan:    Principal Problem:  Rectal bleeding with N/V/D - C. Diff negative, stool studies negative - possibly related to UC flare, appreciate GI team following  - pt has history of pelvic abscess related to UC in 2015 and now has small peri-anal abscess - per surgery, no intervention needed at this time - continue with Cipro and Flagyl, low dose solumedrol 20 mg IV QD   Active Problems:   Sepsis, ulcerative colitis  - pt did not meet criteria for sepsis on admission but this AM T is 101.2 F with HR > 90 and WBC up to 20 K - started on flagyl and cipro 12/22 - WBC is now trending down and pt with no fevers overnight    Hypokalemia - supplemented and WNL this AM    Ulcerative colitis ([place]) - per GI team, appreciate assistance    Cancer of lower-inner quadrant of left female breast ([place]) - notified Dr. Burr Medico of pt's admission   Leukocytosis -  unclear if related to acute illness and imposed on recent use of Prednisone  - WBC trending down  - CBC in AM  DVT prophylaxis - SCD's  Code Status: Full.  Family Communication:  plan of care discussed with the patient and her sister at bedside  Disposition Plan: Home when cleared by GI team and off Solumedrol IV, also still on clear liquids, we need to make sure we advance diet prior to discharge and that pt is tolerating   IV access:  Peripheral IV  Procedures and diagnostic studies:    Ct Abdomen Pelvis Wo Contrast 04/18/2015 Scattered wall thickening of the colon from transverse colon through rectum, greatest at sigmoid colon and rectum, with associated infiltrative changes of mesocolon consistent with patient history of ulcerative colitis. Unable to exclude perirectal/perianal inflammatory process posteriorly ; recommend correlation with physical exam and proctoscopy to exclude fistula and abscess. No evidence of perforation or abscess. Question tiny hiatal hernia. Minimal pericardial effusion.   Dg Chest Port 1 View 04/19/2015  Negative for pneumonia.   Medical Consultants:  GI  Other Consultants:  None  IAnti-Infectives:   Flagyl 12/22 --> Cipro 12/22 -->  [name], MD  [facility] Pager [phone]  If 7PM-7AM, please contact night-coverage [email or web address] Password [facility] 04/20/2015, 11:17 AM   LOS: 1 day   HPI/Subjective: No events overnight. Persistent diarrhea but less frequent.   Objective: Filed Vitals:   04/19/15 0530 04/19/15 1624 04/19/15 2056 04/20/15 0518  BP: 126/68 142/76  111/62 125/63  Pulse: 90 97 80 71  Temp: 99 F (37.2 C) 98.2 F (36.8 C) 98.6 F (37 C) 97.4 F (36.3 C)  TempSrc: Oral Oral Oral Oral  Resp: 18  16 16   Height:      Weight:      SpO2: 96% 98% 95% 99%    Intake/Output Summary (Last 24 hours) at 04/20/15 1117 Last data filed at 04/19/15 2127  Gross per 24 hour  Intake 1107.5 ml  Output    300 ml  Net  807.5 ml     Exam:   General:  Pt is alert, follows commands appropriately, not in acute distress  Cardiovascular: Regular rate and rhythm, S1/S2, no murmurs, no rubs, no gallops  Respiratory: Clear to auscultation bilaterally, no wheezing, no crackles, no rhonchi  Abdomen: Soft, tender in lower abd quadrants, non distended, bowel sounds present, no guarding   Data Reviewed: Basic Metabolic Panel:  Recent Labs Lab 04/18/15 1203 04/19/15 0349 04/20/15 0355  NA 133* 135 137  K 4.0 3.3* 3.5  CL 99* 101 107  CO2 24 23 23   GLUCOSE 110* 127* 122*  BUN 7 7 6   CREATININE 0.70 0.77 0.65  CALCIUM 8.7* 8.6* 8.6*   Liver Function Tests:  Recent Labs Lab 04/18/15 1203  AST 20  ALT 12*  ALKPHOS 90  BILITOT 1.3*  PROT 6.8  ALBUMIN 3.3*   CBC:  Recent Labs Lab 04/18/15 1203 04/19/15 0349 04/20/15 0355  WBC 17.1* 20.1* 12.9*  HGB 13.4 13.6 11.7*  HCT 40.1 41.1 34.8*  MCV 94.4 92.4 91.3  PLT 276 259 230    Recent Results (from the past 240 hour(s))  C difficile quick scan w PCR reflex     Status: Abnormal   Collection Time: 04/18/15  4:38 PM  Result Value Ref Range Status   C Diff antigen INDETERMINATE (A) NEGATIVE Final   C Diff toxin INDETERMINATE (A) NEGATIVE Final   C Diff interpretation Results are indeterminate. See PCR results.  Final  Clostridium Difficile by PCR     Status: None   Collection Time: 04/18/15  4:38 PM  Result Value Ref Range Status   Toxigenic C Difficile by pcr NEGATIVE NEGATIVE Final  Culture, blood (Routine X 2) w Reflex to ID Panel     Status: None (Preliminary result)   Collection Time: 04/19/15 12:18 AM  Result Value Ref Range Status   Specimen Description BLOOD RIGHT HAND  Final   Special Requests BOTTLES DRAWN AEROBIC AND ANAEROBIC 5CC  Final   Culture   Final    NO GROWTH 1 DAY Performed at [facility]    Report Status PENDING  Incomplete  Culture, blood (Routine X 2) w Reflex to ID Panel     Status: None (Preliminary result)    Collection Time: 04/19/15 12:25 AM  Result Value Ref Range Status   Specimen Description BLOOD RIGHT ARM  Final   Special Requests BOTTLES DRAWN AEROBIC AND ANAEROBIC 5CC  Final   Culture   Final    NO GROWTH 1 DAY Performed at [facility]    Report Status PENDING  Incomplete  Gastrointestinal Panel by PCR , Stool     Status: None   Collection Time: 04/19/15  5:46 AM  Result Value Ref Range Status   Campylobacter species NOT DETECTED NOT DETECTED Final   Plesimonas shigelloides NOT DETECTED NOT DETECTED Final   Salmonella species NOT DETECTED NOT DETECTED Final   Yersinia enterocolitica NOT DETECTED  NOT DETECTED Final   Vibrio species NOT DETECTED NOT DETECTED Final   Vibrio cholerae NOT DETECTED NOT DETECTED Final   Enteroaggregative E coli (EAEC) NOT DETECTED NOT DETECTED Final   Enteropathogenic E coli (EPEC) NOT DETECTED NOT DETECTED Final   Enterotoxigenic E coli (ETEC) NOT DETECTED NOT DETECTED Final   Shiga like toxin producing E coli (STEC) NOT DETECTED NOT DETECTED Final   E. coli O157 NOT DETECTED NOT DETECTED Final   Shigella/Enteroinvasive E coli (EIEC) NOT DETECTED NOT DETECTED Final   Cryptosporidium NOT DETECTED NOT DETECTED Final   Cyclospora cayetanensis NOT DETECTED NOT DETECTED Final   Entamoeba histolytica NOT DETECTED NOT DETECTED Final   Giardia lamblia NOT DETECTED NOT DETECTED Final   Adenovirus F40/41 NOT DETECTED NOT DETECTED Final   Astrovirus NOT DETECTED NOT DETECTED Final   Norovirus GI/GII NOT DETECTED NOT DETECTED Final   Rotavirus A NOT DETECTED NOT DETECTED Final   Sapovirus (I, II, IV, and V) NOT DETECTED NOT DETECTED Final  C difficile quick scan w PCR reflex     Status: None   Collection Time: 04/19/15 11:20 AM  Result Value Ref Range Status   C Diff antigen NEGATIVE NEGATIVE Final   C Diff toxin NEGATIVE NEGATIVE Final   C Diff interpretation Negative for toxigenic C. difficile  Final     Scheduled Meds: . ciprofloxacin  400  mg Intravenous BID  . FLUoxetine  20 mg Oral Daily  . methylPREDNISolone (SOLU-MEDROL) injection  20 mg Intravenous Q24H  . metronidazole  500 mg Intravenous 3 times per day   Continuous Infusions: . sodium chloride 75 mL/hr at 04/20/15 1046

## 2015-04-20 NOTE — Progress Notes (Signed)
[facility] Progress Note  Subjective:  Feeling slightly better today.  Feels like BM's may be slowing some.  Would like to shower.  Objective:  Vital signs in last 24 hours: Temp:  [97.4 F (36.3 C)-98.6 F (37 C)] 97.4 F (36.3 C) (12/23 0518) Pulse Rate:  [71-97] 71 (12/23 0518) Resp:  [16] 16 (12/23 0518) BP: (111-142)/(62-76) 125/63 mmHg (12/23 0518) SpO2:  [95 %-99 %] 99 % (12/23 0518) Last BM Date: 04/20/15 General:  Alert, Well-developed, in NAD Heart:  Regular rate and rhythm; no murmurs Pulm:  CTAB.  No W/R/R. Abdomen:  Soft, non-distended. Normal bowel sounds.  Moderate lower abdominal TTP. Extremities:  Without edema. Neurologic:  Alert and oriented x 4;  grossly normal neurologically. Psych:  Alert and cooperative. Normal mood and affect.  Intake/Output from previous day: 12/22 0701 - 12/23 0700 In: 1207.5 [I.V.:607.5; IV Piggyback:600] Out: 300 [Emesis/NG output:300]  Lab Results:  Recent Labs  04/18/15 1203 04/19/15 0349 04/20/15 0355  WBC 17.1* 20.1* 12.9*  HGB 13.4 13.6 11.7*  HCT 40.1 41.1 34.8*  PLT 276 259 230   BMET  Recent Labs  04/18/15 1203 04/19/15 0349 04/20/15 0355  NA 133* 135 137  K 4.0 3.3* 3.5  CL 99* 101 107  CO2 24 23 23   GLUCOSE 110* 127* 122*  BUN 7 7 6   CREATININE 0.70 0.77 0.65  CALCIUM 8.7* 8.6* 8.6*   LFT  Recent Labs  04/18/15 1203  PROT 6.8  ALBUMIN 3.3*  AST 20  ALT 12*  ALKPHOS 90  BILITOT 1.3*   Ct Abdomen Pelvis Wo Contrast  04/18/2015  CLINICAL DATA:  Rectal bleeding for 2 weeks with bowel movements, increasing diarrhea with more blood over this 2 week period, both dark and bright red blood, pain 10/10 at rectum and 6/10 at lower abdomen ; history breast cancer post lumpectomy and radiation therapy, ulcer colitis, irritable bowel syndrome EXAM: CT ABDOMEN AND PELVIS WITHOUT CONTRAST TECHNIQUE: Multidetector CT imaging of the abdomen and pelvis was performed following the standard  protocol without IV contrast. Sagittal and coronal MPR images reconstructed from axial data set. Patient drank dilute oral contrast for exam. COMPARISON:  09/27/2013 FINDINGS: Minimal dependent atelectasis at lung bases. BILATERAL breast prostheses. Minimal pericardial effusion, increased. Within limits of a nonenhanced exam, liver, spleen, gallbladder, pancreas, kidneys, and adrenal glands normal. Uterus and appendix surgically absent by history with normal sized ovaries. Diffuse bowel wall thickening of rectum, portions of sigmoid colon, descending colon and transverse colon compatible with ulcerative colitis. Hyperemia of mesocolon with numerous scattered normal sized pericolic lymph nodes. Greatest degree of wall thickening appears to be at the rectum and sigmoid colon. Minimal infiltration of pericolic and perirectal fat. Findings consistent with colitis and patient history of ulcerative colitis. Posteriorly at the rectum, asymmetric soft tissue infiltration and single focus of gas are identified, uncertain if related to the distal rectum/anus or concomitant perirectal/perianal disease such as fistula or abscess. Proximal colon and small bowel loops unremarkable. Question tiny hiatal hernia, stomach otherwise normal. Bladder and ureters unremarkable. No mass, adenopathy, free air, or free fluid. Mild scattered degenerative disc disease changes of the thoracolumbar spine. IMPRESSION: Scattered wall thickening of the colon from transverse colon through rectum, greatest at sigmoid colon and rectum, with associated infiltrative changes of mesocolon consistent with patient history of ulcerative colitis. Unable to exclude perirectal/perianal inflammatory process posteriorly ; recommend correlation with physical exam and proctoscopy to exclude fistula and abscess. No evidence of perforation  or abscess. Question tiny hiatal hernia. Minimal pericardial effusion. Electronically Signed   By: Lavonia Dana M.D.   On:  04/18/2015 17:27   Ct Abdomen Pelvis W Contrast  04/19/2015  CLINICAL DATA:  Rectal pain, diarrhea, and lower GI bleeding. Ulcerative colitis. Recently underwent lumpectomy and radiation therapy for left breast carcinoma. EXAM: CT ABDOMEN AND PELVIS WITH CONTRAST TECHNIQUE: Multidetector CT imaging of the abdomen and pelvis was performed using the standard protocol following bolus administration of intravenous contrast. CONTRAST:  117m OMNIPAQUE IOHEXOL 300 MG/ML  SOLN COMPARISON:  Noncontrast CT on 04/18/2015 FINDINGS: Lower chest:  No acute findings. Hepatobiliary: No masses or other significant abnormality. Pancreas: No mass, inflammatory changes, or other significant abnormality. Spleen: Within normal limits in size and appearance. Adrenals/Urinary Tract: No masses identified. No evidence of hydronephrosis. Stomach/Bowel: Again demonstrated is moderate long-segment colitis involving the transverse, descending, and rectosigmoid colon. No evidence of intraperitoneal abscess or free air. No evidence of small bowel or terminal ileal involvement. A persistent area of soft tissue density with central air bubble is seen along the posterior anal sphincter which measures approximately 1.3 x 2.4 cm on image 90/series 2. This is consistent with a small perianal abscess. Vascular/Lymphatic: No pathologically enlarged lymph nodes. No evidence of abdominal aortic aneurysm. Reproductive: Prior hysterectomy noted. Adnexal regions are unremarkable in appearance. Other: None. Musculoskeletal:  No suspicious bone lesions identified. IMPRESSION: No significant change in moderate colitis involving the transverse, descending, and rectosigmoid colon. Probable small posterior perianal abscess measuring approximately 1.3 x 2.4 cm, also without significant change. If clinically warranted, consider pelvic MRI without and with contrast for further evaluation. Electronically Signed   By: JEarle GellM.D.   On: 04/19/2015 13:36   Dg  Chest Port 1 View  04/19/2015  CLINICAL DATA:  Sudden onset fever EXAM: PORTABLE CHEST 1 VIEW COMPARISON:  10/22/2011 FINDINGS: No cardiomegaly. Negative aortic and hilar contours. A hazy density at the cardiac apex is fat pad based on 2013 chest CT. There is no edema, consolidation, effusion, or pneumothorax. Bilateral breast implants. IMPRESSION: Negative for pneumonia. Electronically Signed   By: JMonte FantasiaM.D.   On: 04/19/2015 02:53   Assessment / Plan: *74year old female with ulcerative pancolitis for 45 years, not on any medication for the past 3 months. Now with flare of symptoms with diarrhea, rectal bleeding, rectal pain, and abdominal cramps. Also has leukocytosis. Cdiff negative. Has history of pelvic abscess related to disease in 2015 and has small peri-anal abscess now as well.  Surgery said no intervention from their standpoint.  On cipro and flagyl.  Also on low-dose IV steroids (20 mg) for the colitis.    -Spoke with IR as well and they reviewed CT scan but cannot do anything with the abscess. -? Balsalazide vs immuno-modulators vs biologics for maintenance in the future.   LOS: 1 day   ZEHR, JESSICA D.  04/20/2015, 10:16 AM  Pager number [phone]   Attending physician's note   I have taken an interval history, reviewed the chart and examined the patient. I agree with the Advanced Practitioner's note, impression and recommendations.  Frequency of diarrhea has improved. She continues to have rectal discomfort. Peri anal abscess 1.3x2.4cm, Surgery and IR didn't feel it is large enough to drain. Continue Cipro and flagyl. Given colitis flare with significant symptoms, will do the IV solumedrol 229mand will try to taper down once symptoms start improving. If develops fever or worsening leucocytosis, may have to consider re  imaging peri anal abscess. Will have to consider starting biologics once the abscess resolves.   Damaris Hippo, MD [phone] Mon-Fri  8a-5p [phone] after 5p, weekends, holidays

## 2015-04-20 NOTE — Progress Notes (Signed)
PT Cancellation Note  Patient Details Name: Sara Barnes MRN: [id number] DOB: 1940-11-16   Cancelled Treatment:    Reason Eval/Treat Not Completed: Attempted PT eval-pt declined to participate due to having visitors. Will check back another day.    Weston Anna, MPT Pager: [phone]

## 2015-04-21 DIAGNOSIS — E860 Dehydration: Secondary | ICD-10-CM

## 2015-04-21 LAB — BASIC METABOLIC PANEL
Anion gap: 8 (ref 5–15)
BUN: 7 mg/dL (ref 6–20)
CHLORIDE: 107 mmol/L (ref 101–111)
CO2: 21 mmol/L — AB (ref 22–32)
CREATININE: 0.48 mg/dL (ref 0.44–1.00)
Calcium: 8.2 mg/dL — ABNORMAL LOW (ref 8.9–10.3)
GFR calc non Af Amer: >60 mL/min (ref >60)
Glucose, Bld: 100 mg/dL — ABNORMAL HIGH (ref 65–99)
POTASSIUM: 3.5 mmol/L (ref 3.5–5.1)
Sodium: 136 mmol/L (ref 135–145)

## 2015-04-21 LAB — CBC
HEMATOCRIT: 32.4 % — AB (ref 36.0–46.0)
HEMOGLOBIN: 10.8 g/dL — AB (ref 12.0–15.0)
MCH: 30.8 pg (ref 26.0–34.0)
MCHC: 33.3 g/dL (ref 30.0–36.0)
MCV: 92.3 fL (ref 78.0–100.0)
Platelets: 233 10*3/uL (ref 150–400)
RBC: 3.51 MIL/uL — ABNORMAL LOW (ref 3.87–5.11)
RDW: 12.5 % (ref 11.5–15.5)
WBC: 9.7 10*3/uL (ref 4.0–10.5)

## 2015-04-21 MED ORDER — MESALAMINE 4 G RE ENEM
4.0000 g | ENEMA | Freq: Two times a day (BID) | RECTAL | Status: DC
  Administered 2015-04-21 – 2015-04-22 (×3): 4 g via RECTAL
  Filled 2015-04-21 (×4): qty 60

## 2015-04-21 MED ORDER — HYDROCORTISONE ACETATE 25 MG RE SUPP
25.0000 mg | Freq: Two times a day (BID) | RECTAL | Status: DC
  Administered 2015-04-21 – 2015-04-22 (×2): 25 mg via RECTAL
  Filled 2015-04-21 (×4): qty 1

## 2015-04-21 NOTE — Progress Notes (Signed)
PT Cancellation Note  Patient Details Name: Sara Barnes MRN: [id number] DOB: 06/17/1940   Cancelled Treatment:     Ms Cliff states that she has been getting up and down to the bathroom several times with no difficulty. She does not feel she needs physical therapy or assist with walking. Encouraged her to keep walking and progress to hallway several times a day and to let her nurse/doctor  know if she feels she is having difficulty and we would be happy to reconsult.  Will not do an evaluation at this time.    Maudry Diego [facility] 04/21/2015, 8:01 AM

## 2015-04-21 NOTE — Progress Notes (Signed)
Patient ID: Sara Barnes, female   DOB: Mar 12, 1941, 74 y.o.   MRN: [id number]  [facility] PROGRESS NOTE  ANGELIKA JERRETT STM:[id number] DOB: 27-May-1940 DOA: 04/18/2015 PCP: [name], DO   Brief narrative:    74 y.o. female with known ulcerative colitis, breast cancer in remission, no chemotherapy (this was a surgical excision with radiation several months ago, was on Femara per Dr. Burr Medico)), presented to [facility] ED with main concern of more than two weeks duration of progressively worsening diarrhea initially with bright read blood in the stool but has become progressively darker in the past several days. Pt reports over 12 episodes per day. This has been associated with intermittent tenesmus, rectal pain that is sharp and 10/10 in severity when present, occasionally but not consistently radiating to lower abd quadrants and with no specific alleviating factors. Pt denies fevers chills, no chest pain or shortness of breath.   In ED, pt noted to be hemodynamically stable, VSS. [facility] asked to admit for further evaluation.  Assessment/Plan:    Principal Problem:  Rectal bleeding with N/V/D - C. Diff negative, stool studies negative - possibly related to UC flare, appreciate GI team following  - pt has history of pelvic abscess related to UC in 2015 and now has small peri-anal abscess - per surgery, no intervention needed at this time - continue with Cipro and Flagyl, low dose solumedrol 20 mg IV QD which may need to be increased as pt still with multiple episodes of diarrhea overnight   Active Problems:   Sepsis, ulcerative colitis  - pt did not meet criteria for sepsis on admission but this AM T is 101.2 F with HR > 90 and WBC up to 20 K - started on flagyl and cipro 12/22 - WBC is now trending down and is WNL this AM    Hypokalemia - supplemented and WNL this AM    Ulcerative colitis (HCC) - per GI team, appreciate assistance    Cancer of lower-inner quadrant of left female  breast ([place]) - notified Dr. Burr Medico of pt's admission   Leukocytosis - unclear if related to acute illness and imposed on recent use of Prednisone  - WBC trending down and WNL this AM  DVT prophylaxis - SCD's  Code Status: Full.  Family Communication:  plan of care discussed with the patient and her sister at bedside  Disposition Plan: Home when cleared by GI team and off Solumedrol IV, also still on clear liquids, we need to make sure we advance diet prior to discharge and that pt is tolerating   IV access:  Peripheral IV  Procedures and diagnostic studies:    Ct Abdomen Pelvis Wo Contrast 04/18/2015 Scattered wall thickening of the colon from transverse colon through rectum, greatest at sigmoid colon and rectum, with associated infiltrative changes of mesocolon consistent with patient history of ulcerative colitis. Unable to exclude perirectal/perianal inflammatory process posteriorly ; recommend correlation with physical exam and proctoscopy to exclude fistula and abscess. No evidence of perforation or abscess. Question tiny hiatal hernia. Minimal pericardial effusion.   Dg Chest Port 1 View 04/19/2015  Negative for pneumonia.   Medical Consultants:  GI  Other Consultants:  None  IAnti-Infectives:   Flagyl 12/22 --> Cipro 12/22 -->  [name], MD  [facility] Pager [phone]  If 7PM-7AM, please contact night-coverage [email or web address] Password [facility] 04/21/2015, 7:34 AM   LOS: 2 days   HPI/Subjective: No events overnight. Persistent diarrhea overnight.   Objective: Filed Vitals:   04/20/15  1212 04/20/15 1336 04/20/15 2106 04/21/15 0612  BP: 104/52 108/60 99/59 112/62  Pulse: 77 74 69 67  Temp: 97.3 F (36.3 C) 97.6 F (36.4 C) 97.6 F (36.4 C) 97.4 F (36.3 C)  TempSrc: Oral Oral Oral Oral  Resp: 16 16 16 16   Height:      Weight:      SpO2: 99% 100% 97% 97%    Intake/Output Summary (Last 24 hours) at 04/21/15 0734 Last data filed at 04/20/15 1400  Gross  per 24 hour  Intake   1075 ml  Output      0 ml  Net   1075 ml    Exam:   General:  Pt is alert, follows commands appropriately, not in acute distress  Cardiovascular: Regular rate and rhythm, S1/S2, no murmurs, no rubs, no gallops  Respiratory: Clear to auscultation bilaterally, no wheezing, no crackles, no rhonchi  Abdomen: Soft, tender in lower abd quadrants, non distended, bowel sounds present, no guarding   Data Reviewed: Basic Metabolic Panel:  Recent Labs Lab 04/18/15 1203 04/19/15 0349 04/20/15 0355 04/21/15 0436  NA 133* 135 137 136  K 4.0 3.3* 3.5 3.5  CL 99* 101 107 107  CO2 24 23 23  21*  GLUCOSE 110* 127* 122* 100*  BUN 7 7 6 7   CREATININE 0.70 0.77 0.65 0.48  CALCIUM 8.7* 8.6* 8.6* 8.2*   Liver Function Tests:  Recent Labs Lab 04/18/15 1203  AST 20  ALT 12*  ALKPHOS 90  BILITOT 1.3*  PROT 6.8  ALBUMIN 3.3*   CBC:  Recent Labs Lab 04/18/15 1203 04/19/15 0349 04/20/15 0355 04/21/15 0436  WBC 17.1* 20.1* 12.9* 9.7  HGB 13.4 13.6 11.7* 10.8*  HCT 40.1 41.1 34.8* 32.4*  MCV 94.4 92.4 91.3 92.3  PLT 276 259 230 233    Recent Results (from the past 240 hour(s))  C difficile quick scan w PCR reflex     Status: Abnormal   Collection Time: 04/18/15  4:38 PM  Result Value Ref Range Status   C Diff antigen INDETERMINATE (A) NEGATIVE Final   C Diff toxin INDETERMINATE (A) NEGATIVE Final   C Diff interpretation Results are indeterminate. See PCR results.  Final  Clostridium Difficile by PCR     Status: None   Collection Time: 04/18/15  4:38 PM  Result Value Ref Range Status   Toxigenic C Difficile by pcr NEGATIVE NEGATIVE Final  Culture, blood (Routine X 2) w Reflex to ID Panel     Status: None (Preliminary result)   Collection Time: 04/19/15 12:18 AM  Result Value Ref Range Status   Specimen Description BLOOD RIGHT HAND  Final   Special Requests BOTTLES DRAWN AEROBIC AND ANAEROBIC 5CC  Final   Culture   Final    NO GROWTH 1  DAY Performed at [facility]    Report Status PENDING  Incomplete  Culture, blood (Routine X 2) w Reflex to ID Panel     Status: None (Preliminary result)   Collection Time: 04/19/15 12:25 AM  Result Value Ref Range Status   Specimen Description BLOOD RIGHT ARM  Final   Special Requests BOTTLES DRAWN AEROBIC AND ANAEROBIC 5CC  Final   Culture   Final    NO GROWTH 1 DAY Performed at [facility]    Report Status PENDING  Incomplete  Gastrointestinal Panel by PCR , Stool     Status: None   Collection Time: 04/19/15  5:46 AM  Result Value Ref Range Status  Campylobacter species NOT DETECTED NOT DETECTED Final   Plesimonas shigelloides NOT DETECTED NOT DETECTED Final   Salmonella species NOT DETECTED NOT DETECTED Final   Yersinia enterocolitica NOT DETECTED NOT DETECTED Final   Vibrio species NOT DETECTED NOT DETECTED Final   Vibrio cholerae NOT DETECTED NOT DETECTED Final   Enteroaggregative E coli (EAEC) NOT DETECTED NOT DETECTED Final   Enteropathogenic E coli (EPEC) NOT DETECTED NOT DETECTED Final   Enterotoxigenic E coli (ETEC) NOT DETECTED NOT DETECTED Final   Shiga like toxin producing E coli (STEC) NOT DETECTED NOT DETECTED Final   E. coli O157 NOT DETECTED NOT DETECTED Final   Shigella/Enteroinvasive E coli (EIEC) NOT DETECTED NOT DETECTED Final   Cryptosporidium NOT DETECTED NOT DETECTED Final   Cyclospora cayetanensis NOT DETECTED NOT DETECTED Final   Entamoeba histolytica NOT DETECTED NOT DETECTED Final   Giardia lamblia NOT DETECTED NOT DETECTED Final   Adenovirus F40/41 NOT DETECTED NOT DETECTED Final   Astrovirus NOT DETECTED NOT DETECTED Final   Norovirus GI/GII NOT DETECTED NOT DETECTED Final   Rotavirus A NOT DETECTED NOT DETECTED Final   Sapovirus (I, II, IV, and V) NOT DETECTED NOT DETECTED Final  C difficile quick scan w PCR reflex     Status: None   Collection Time: 04/19/15 11:20 AM  Result Value Ref Range Status   C Diff antigen NEGATIVE  NEGATIVE Final   C Diff toxin NEGATIVE NEGATIVE Final   C Diff interpretation Negative for toxigenic C. difficile  Final     Scheduled Meds: . ciprofloxacin  400 mg Intravenous BID  . FLUoxetine  20 mg Oral Daily  . methylPREDNISolone (SOLU-MEDROL) injection  20 mg Intravenous Q24H  . metronidazole  500 mg Intravenous 3 times per day   Continuous Infusions: . sodium chloride 75 mL/hr at 04/21/15 0215

## 2015-04-21 NOTE — Progress Notes (Signed)
[facility] Progress Note  Subjective:  Feels about the same as yesterday.  10 stools documented for yesterday as well.  Ate jello and she said it "went right through".  Objective:  Vital signs in last 24 hours: Temp:  [97.3 F (36.3 C)-97.6 F (36.4 C)] 97.4 F (36.3 C) (12/24 0612) Pulse Rate:  [67-77] 67 (12/24 0612) Resp:  [16] 16 (12/24 0612) BP: (99-112)/(52-62) 112/62 mmHg (12/24 0612) SpO2:  [97 %-100 %] 97 % (12/24 0612) Last BM Date: 04/20/15 General:  Alert, Well-developed, in NAD Heart:  Regular rate and rhythm; no murmurs Pulm:  CTAB.  No W/R/R. Abdomen:  Soft, non-distended. Normal bowel sounds.  Moderate lower abdominal TTP.   Extremities:  Without edema. Neurologic:  Alert and oriented x 4;  grossly normal neurologically. Psych:  Alert and cooperative. Normal mood and affect.  Intake/Output from previous day: 12/23 0701 - 12/24 0700 In: 1075 [I.V.:675; IV Piggyback:400] Out: -   Lab Results:  Recent Labs  04/19/15 0349 04/20/15 0355 04/21/15 0436  WBC 20.1* 12.9* 9.7  HGB 13.6 11.7* 10.8*  HCT 41.1 34.8* 32.4*  PLT 259 230 233   BMET  Recent Labs  04/19/15 0349 04/20/15 0355 04/21/15 0436  NA 135 137 136  K 3.3* 3.5 3.5  CL 101 107 107  CO2 23 23 21*  GLUCOSE 127* 122* 100*  BUN 7 6 7   CREATININE 0.77 0.65 0.48  CALCIUM 8.6* 8.6* 8.2*   LFT  Recent Labs  04/18/15 1203  PROT 6.8  ALBUMIN 3.3*  AST 20  ALT 12*  ALKPHOS 90  BILITOT 1.3*   Ct Abdomen Pelvis W Contrast  04/19/2015  CLINICAL DATA:  Rectal pain, diarrhea, and lower GI bleeding. Ulcerative colitis. Recently underwent lumpectomy and radiation therapy for left breast carcinoma. EXAM: CT ABDOMEN AND PELVIS WITH CONTRAST TECHNIQUE: Multidetector CT imaging of the abdomen and pelvis was performed using the standard protocol following bolus administration of intravenous contrast. CONTRAST:  160m OMNIPAQUE IOHEXOL 300 MG/ML  SOLN COMPARISON:  Noncontrast CT  on 04/18/2015 FINDINGS: Lower chest:  No acute findings. Hepatobiliary: No masses or other significant abnormality. Pancreas: No mass, inflammatory changes, or other significant abnormality. Spleen: Within normal limits in size and appearance. Adrenals/Urinary Tract: No masses identified. No evidence of hydronephrosis. Stomach/Bowel: Again demonstrated is moderate long-segment colitis involving the transverse, descending, and rectosigmoid colon. No evidence of intraperitoneal abscess or free air. No evidence of small bowel or terminal ileal involvement. A persistent area of soft tissue density with central air bubble is seen along the posterior anal sphincter which measures approximately 1.3 x 2.4 cm on image 90/series 2. This is consistent with a small perianal abscess. Vascular/Lymphatic: No pathologically enlarged lymph nodes. No evidence of abdominal aortic aneurysm. Reproductive: Prior hysterectomy noted. Adnexal regions are unremarkable in appearance. Other: None. Musculoskeletal:  No suspicious bone lesions identified. IMPRESSION: No significant change in moderate colitis involving the transverse, descending, and rectosigmoid colon. Probable small posterior perianal abscess measuring approximately 1.3 x 2.4 cm, also without significant change. If clinically warranted, consider pelvic MRI without and with contrast for further evaluation. Electronically Signed   By: JEarle GellM.D.   On: 04/19/2015 13:36   Assessment / Plan: *74year old female with ulcerative pancolitis for 45 years, not on any medication for the past 3 months. Now with flare of symptoms with diarrhea, rectal bleeding, rectal pain, and abdominal cramps. Also has leukocytosis. Cdiff negative. Has history of pelvic abscess related to  disease in 2015 and has small peri-anal abscess now as well on imaging. Surgery and IR said no intervention for the abscess from their standpoint. On cipro and flagyl. Also on low-dose IV steroids (20 mg)  for the colitis.   -Will discuss with Dr. Silverio Decamp regarding increasing steroids to 40 mg since she does not seem to be having a great response with 20 mg, but decision of increasing the dose is obviously complicated by the abscess. -? Balsalazide vs immuno-modulators vs biologics for maintenance in the future.    LOS: 2 days   ZEHR, JESSICA D.  04/21/2015, 9:02 AM  Pager number [phone]    Attending physician's note   I have taken a history, examined the patient and reviewed the chart. I agree with the Advanced Practitioner's note, impression and recommendations. Leucocytosis is improving, no fever or chills. Rectal pain has also improved. He continues to have small volume bowel movements with blood associated with tenesmus. We will hold off increasing steroid dose given the Peri anal abscess, continue Solu-Medrol 20 mg IV. We'll start Rowasa enema and Anusol suppository twice a day  [phone] Mon-Fri 8a-5p [phone] after 5p, weekends, holidays

## 2015-04-22 DIAGNOSIS — K611 Rectal abscess: Secondary | ICD-10-CM | POA: Diagnosis present

## 2015-04-22 DIAGNOSIS — R502 Drug induced fever: Secondary | ICD-10-CM

## 2015-04-22 LAB — CBC
HCT: 35.6 % — ABNORMAL LOW (ref 36.0–46.0)
Hemoglobin: 11.7 g/dL — ABNORMAL LOW (ref 12.0–15.0)
MCH: 30.2 pg (ref 26.0–34.0)
MCHC: 32.9 g/dL (ref 30.0–36.0)
MCV: 92 fL (ref 78.0–100.0)
PLATELETS: 246 10*3/uL (ref 150–400)
RBC: 3.87 MIL/uL (ref 3.87–5.11)
RDW: 12.6 % (ref 11.5–15.5)
WBC: 11.3 10*3/uL — ABNORMAL HIGH (ref 4.0–10.5)

## 2015-04-22 LAB — BASIC METABOLIC PANEL
Anion gap: 9 (ref 5–15)
BUN: 5 mg/dL — AB (ref 6–20)
CALCIUM: 8.2 mg/dL — AB (ref 8.9–10.3)
CO2: 25 mmol/L (ref 22–32)
CREATININE: 0.59 mg/dL (ref 0.44–1.00)
Chloride: 104 mmol/L (ref 101–111)
GFR calc Af Amer: >60 mL/min (ref >60)
GLUCOSE: 96 mg/dL (ref 65–99)
POTASSIUM: 3.5 mmol/L (ref 3.5–5.1)
Sodium: 138 mmol/L (ref 135–145)

## 2015-04-22 MED ORDER — HYDROCORTISONE ACETATE 25 MG RE SUPP
25.0000 mg | Freq: Every day | RECTAL | Status: DC
Start: 2015-04-23 — End: 2015-04-27
  Administered 2015-04-23 – 2015-04-26 (×4): 25 mg via RECTAL
  Filled 2015-04-22 (×6): qty 1

## 2015-04-22 MED ORDER — MESALAMINE 4 G RE ENEM
4.0000 g | ENEMA | Freq: Every day | RECTAL | Status: DC
  Filled 2015-04-22 (×2): qty 60

## 2015-04-22 NOTE — Progress Notes (Signed)
[facility] ROUNDING NOTE   Subjective: She had an episode of low-grade fever with chills. Complains of tenesmus with blood in stool, decreased frequency compared to yesterday. She was able to retain the Rowasa last night.    Objective: Vital signs in last 24 hours: Temp:  [97.7 F (36.5 C)-100.1 F (37.8 C)] 100.1 F (37.8 C) (12/25 0446) Pulse Rate:  [75-82] 82 (12/25 0511) Resp:  [15-16] 15 (12/25 0511) BP: (115-138)/(60-66) 138/64 mmHg (12/25 0511) SpO2:  [98 %-100 %] 98 % (12/25 0511) Last BM Date: 04/22/15 General:  Well-developed, well-nourished and in no acute distress Eyes:  anicteric. Lungs: Clear to auscultation bilaterally. Heart:  S1S2, no rubs, murmurs, gallops. Abdomen:  soft, non-tender, no hepatosplenomegaly, hernia, or mass and BS+.  Extremities:   no edema Skin   no rash. Neuro:  A&O x 3.  Psych:  appropriate mood and  Affect.     Intake/Output from previous day: 12/24 0701 - 12/25 0700 In: -  Out: 1 [Stool:1] Intake/Output this shift:     Lab Results:  Recent Labs  04/20/15 0355 04/21/15 0436 04/22/15 0420  WBC 12.9* 9.7 11.3*  HGB 11.7* 10.8* 11.7*  PLT 230 233 246  MCV 91.3 92.3 92.0   BMET  Recent Labs  04/20/15 0355 04/21/15 0436 04/22/15 0420  NA 137 136 138  K 3.5 3.5 3.5  CL 107 107 104  CO2 23 21* 25  GLUCOSE 122* 100* 96  BUN 6 7 5*  CREATININE 0.65 0.48 0.59  CALCIUM 8.6* 8.2* 8.2*     Assessment  74 year old female with history of ulcerative colitis, breast cancer in remission status postsurgical resection admitted with bloody diarrhea and Peri Anal abscess She appears to be improving slowly  Continue Cipro and Flagyl Perianal abscess was small to drain per IR and surgery, if she develops any episodes of fever or worsening leukocytosis will obtain pelvic ultrasound to assess the abscess Continue Solu-Medrol 20 mg IV   we'll change Rowasa to bedtime and hydrocortisone suppository in the  a.m.    Damaris Hippo , MD [phone] Mon-Fri 8a-5p [phone] after 5p, weekends, holidays [facility]

## 2015-04-22 NOTE — Progress Notes (Signed)
Patient ID: Sara Barnes, female   DOB: Sep 19, 1940, 74 y.o.   MRN: [id number]  [facility] PROGRESS NOTE  Sara Barnes [id number] DOB: Jan 19, 1941 DOA: 04/18/2015 PCP: [name], DO   Brief narrative:    74 y.o. female with known ulcerative colitis, breast cancer in remission, no chemotherapy (this was a surgical excision with radiation several months ago, was on Femara per Dr. Burr Medico)), presented to [facility] ED with main concern of more than two weeks duration of progressively worsening diarrhea initially with bright read blood in the stool but has become progressively darker in the past several days. Pt reports over 12 episodes per day. This has been associated with intermittent tenesmus, rectal pain that is sharp and 10/10 in severity when present, occasionally but not consistently radiating to lower abd quadrants and with no specific alleviating factors. Pt denies fevers chills, no chest pain or shortness of breath.   In ED, pt noted to be hemodynamically stable, VSS. TRH asked to admit for further evaluation.  Assessment/Plan:    Principal Problem:  Rectal bleeding with N/V/D - C. Diff negative, stool studies negative - possibly related to UC flare, appreciate GI team following  - pt has history of pelvic abscess related to UC in 2015 and now has small peri-anal abscess - per surgery, no intervention needed at this time - continue with Cipro and Flagyl, low dose solumedrol 20 mg IV QD - pt febrile overnight, not sure what the reason is but will d/c with GI if possibly from Rowasa   Active Problems:   Sepsis, ulcerative colitis  - pt did not meet criteria for sepsis on admission but this AM T is 101.2 F with HR > 90 and WBC up to 20 K - started on flagyl and cipro 12/22 - pt febrile overnight with T 100.1 F and WBC up this AM, ? Rowasa, d/w GI team     Hypokalemia - supplemented and WNL this AM    Ulcerative colitis ([place]) - per GI team, appreciate assistance    Cancer  of lower-inner quadrant of left female breast [facility]) - notified Dr. Burr Medico of pt's admission   Leukocytosis - unclear if related to acute illness and imposed on recent use of Prednisone  - WBC up this AM, continue same abx as noted above   DVT prophylaxis - SCD's  Code Status: Full.  Family Communication:  plan of care discussed with the patient and her sister at bedside  Disposition Plan: Home when cleared by GI team and off Solumedrol IV, also still on clear liquids, we need to make sure we advance diet prior to discharge and that pt is tolerating   IV access:  Peripheral IV  Procedures and diagnostic studies:    Ct Abdomen Pelvis Wo Contrast 04/18/2015 Scattered wall thickening of the colon from transverse colon through rectum, greatest at sigmoid colon and rectum, with associated infiltrative changes of mesocolon consistent with patient history of ulcerative colitis. Unable to exclude perirectal/perianal inflammatory process posteriorly ; recommend correlation with physical exam and proctoscopy to exclude fistula and abscess. No evidence of perforation or abscess. Question tiny hiatal hernia. Minimal pericardial effusion.   Dg Chest Port 1 View 04/19/2015  Negative for pneumonia.   Medical Consultants:  GI  Other Consultants:  None  IAnti-Infectives:   Flagyl 12/22 --> Cipro 12/22 -->  [name], MD  [facility] Pager [phone]  If 7PM-7AM, please contact night-coverage [email or web address] Password [email or web address] 04/22/2015, 9:57 AM   LOS: 3 days  HPI/Subjective: No events overnight. Persistent diarrhea overnight. Fever up to 100.76F.   Objective: Filed Vitals:   04/21/15 1727 04/21/15 2112 04/22/15 0446 04/22/15 0511  BP: 115/66 117/60  138/64  Pulse: 79 75  82  Temp: 98.2 F (36.8 C) 97.7 F (36.5 C) 100.1 F (37.8 C)   TempSrc: Oral Oral Axillary   Resp: 16 16  15   Height:      Weight:      SpO2: 100% 100%  98%    Intake/Output Summary (Last 24 hours) at 04/22/15  0957 Last data filed at 04/22/15 0001  Gross per 24 hour  Intake      0 ml  Output      1 ml  Net     -1 ml    Exam:   General:  Pt is alert, follows commands appropriately, not in acute distress  Cardiovascular: Regular rate and rhythm, S1/S2, no murmurs, no rubs, no gallops  Respiratory: Clear to auscultation bilaterally, no wheezing, no crackles, no rhonchi  Abdomen: Soft, tender in lower abd quadrants, non distended, bowel sounds present, no guarding   Data Reviewed: Basic Metabolic Panel:  Recent Labs Lab 04/18/15 1203 04/19/15 0349 04/20/15 0355 04/21/15 0436 04/22/15 0420  NA 133* 135 137 136 138  K 4.0 3.3* 3.5 3.5 3.5  CL 99* 101 107 107 104  CO2 24 23 23  21* 25  GLUCOSE 110* 127* 122* 100* 96  BUN 7 7 6 7  5*  CREATININE 0.70 0.77 0.65 0.48 0.59  CALCIUM 8.7* 8.6* 8.6* 8.2* 8.2*   Liver Function Tests:  Recent Labs Lab 04/18/15 1203  AST 20  ALT 12*  ALKPHOS 90  BILITOT 1.3*  PROT 6.8  ALBUMIN 3.3*   CBC:  Recent Labs Lab 04/18/15 1203 04/19/15 0349 04/20/15 0355 04/21/15 0436 04/22/15 0420  WBC 17.1* 20.1* 12.9* 9.7 11.3*  HGB 13.4 13.6 11.7* 10.8* 11.7*  HCT 40.1 41.1 34.8* 32.4* 35.6*  MCV 94.4 92.4 91.3 92.3 92.0  PLT 276 259 230 233 246    Recent Results (from the past 240 hour(s))  C difficile quick scan w PCR reflex     Status: Abnormal   Collection Time: 04/18/15  4:38 PM  Result Value Ref Range Status   C Diff antigen INDETERMINATE (A) NEGATIVE Final   C Diff toxin INDETERMINATE (A) NEGATIVE Final   C Diff interpretation Results are indeterminate. See PCR results.  Final  Clostridium Difficile by PCR     Status: None   Collection Time: 04/18/15  4:38 PM  Result Value Ref Range Status   Toxigenic C Difficile by pcr NEGATIVE NEGATIVE Final  Culture, blood (Routine X 2) w Reflex to ID Panel     Status: None (Preliminary result)   Collection Time: 04/19/15 12:18 AM  Result Value Ref Range Status   Specimen Description  BLOOD RIGHT HAND  Final   Special Requests BOTTLES DRAWN AEROBIC AND ANAEROBIC 5CC  Final   Culture   Final    NO GROWTH 2 DAYS Performed at [facility]    Report Status PENDING  Incomplete  Culture, blood (Routine X 2) w Reflex to ID Panel     Status: None (Preliminary result)   Collection Time: 04/19/15 12:25 AM  Result Value Ref Range Status   Specimen Description BLOOD RIGHT ARM  Final   Special Requests BOTTLES DRAWN AEROBIC AND ANAEROBIC 5CC  Final   Culture   Final    NO GROWTH 2 DAYS Performed at  [facility]    Report Status PENDING  Incomplete  Gastrointestinal Panel by PCR , Stool     Status: None   Collection Time: 04/19/15  5:46 AM  Result Value Ref Range Status   Campylobacter species NOT DETECTED NOT DETECTED Final   Plesimonas shigelloides NOT DETECTED NOT DETECTED Final   Salmonella species NOT DETECTED NOT DETECTED Final   Yersinia enterocolitica NOT DETECTED NOT DETECTED Final   Vibrio species NOT DETECTED NOT DETECTED Final   Vibrio cholerae NOT DETECTED NOT DETECTED Final   Enteroaggregative E coli (EAEC) NOT DETECTED NOT DETECTED Final   Enteropathogenic E coli (EPEC) NOT DETECTED NOT DETECTED Final   Enterotoxigenic E coli (ETEC) NOT DETECTED NOT DETECTED Final   Shiga like toxin producing E coli (STEC) NOT DETECTED NOT DETECTED Final   E. coli O157 NOT DETECTED NOT DETECTED Final   Shigella/Enteroinvasive E coli (EIEC) NOT DETECTED NOT DETECTED Final   Cryptosporidium NOT DETECTED NOT DETECTED Final   Cyclospora cayetanensis NOT DETECTED NOT DETECTED Final   Entamoeba histolytica NOT DETECTED NOT DETECTED Final   Giardia lamblia NOT DETECTED NOT DETECTED Final   Adenovirus F40/41 NOT DETECTED NOT DETECTED Final   Astrovirus NOT DETECTED NOT DETECTED Final   Norovirus GI/GII NOT DETECTED NOT DETECTED Final   Rotavirus A NOT DETECTED NOT DETECTED Final   Sapovirus (I, II, IV, and V) NOT DETECTED NOT DETECTED Final  C difficile quick scan w  PCR reflex     Status: None   Collection Time: 04/19/15 11:20 AM  Result Value Ref Range Status   C Diff antigen NEGATIVE NEGATIVE Final   C Diff toxin NEGATIVE NEGATIVE Final   C Diff interpretation Negative for toxigenic C. difficile  Final     Scheduled Meds: . ciprofloxacin  400 mg Intravenous BID  . FLUoxetine  20 mg Oral Daily  . hydrocortisone  25 mg Rectal BID  . mesalamine  4 g Rectal BID  . methylPREDNISolone (SOLU-MEDROL) injection  20 mg Intravenous Q24H  . metronidazole  500 mg Intravenous 3 times per day   Continuous Infusions: . sodium chloride 75 mL/hr at 04/21/15 2019

## 2015-04-23 LAB — BASIC METABOLIC PANEL
Anion gap: 7 (ref 5–15)
BUN: 5 mg/dL — ABNORMAL LOW (ref 6–20)
CALCIUM: 8 mg/dL — AB (ref 8.9–10.3)
CO2: 25 mmol/L (ref 22–32)
Chloride: 106 mmol/L (ref 101–111)
Creatinine, Ser: 0.64 mg/dL (ref 0.44–1.00)
Glucose, Bld: 111 mg/dL — ABNORMAL HIGH (ref 65–99)
POTASSIUM: 3.7 mmol/L (ref 3.5–5.1)
Sodium: 138 mmol/L (ref 135–145)

## 2015-04-23 LAB — CBC
HEMATOCRIT: 32.7 % — AB (ref 36.0–46.0)
Hemoglobin: 10.7 g/dL — ABNORMAL LOW (ref 12.0–15.0)
MCH: 30.1 pg (ref 26.0–34.0)
MCHC: 32.7 g/dL (ref 30.0–36.0)
MCV: 91.9 fL (ref 78.0–100.0)
PLATELETS: 245 10*3/uL (ref 150–400)
RBC: 3.56 MIL/uL — AB (ref 3.87–5.11)
RDW: 12.7 % (ref 11.5–15.5)
WBC: 8.3 10*3/uL (ref 4.0–10.5)

## 2015-04-23 NOTE — Progress Notes (Signed)
Patient ID: Sara Barnes, female   DOB: 1941/03/05, 74 y.o.   MRN: [id number]  [facility] PROGRESS NOTE  IMAJEAN MCDERMID [id number] DOB: February 14, 1941 DOA: 04/18/2015 PCP: [name], DO   Brief narrative:    74 y.o. female with known ulcerative colitis, breast cancer in remission, no chemotherapy (this was a surgical excision with radiation several months ago, was on Femara per Dr. Burr Medico)), presented to [facility] ED with main concern of more than two weeks duration of progressively worsening diarrhea initially with bright read blood in the stool but has become progressively darker in the past several days. Pt reports over 12 episodes per day. This has been associated with intermittent tenesmus, rectal pain that is sharp and 10/10 in severity when present, occasionally but not consistently radiating to lower abd quadrants and with no specific alleviating factors. Pt denies fevers chills, no chest pain or shortness of breath.   In ED, pt noted to be hemodynamically stable, VSS. TRH asked to admit for further evaluation.  Assessment/Plan:    Principal Problem:  Rectal bleeding with N/V/D - C. Diff negative, stool studies negative - related to UC flare, appreciate GI team following  - pt has history of pelvic abscess related to UC in 2015 and now has small peri-anal abscess - per surgery, no intervention needed at this time - pt reports feeling better this AM, less frequent diarrhea 4-5 episodes last night  - continue with Cipro and Flagyl, low dose solumedrol 20 mg IV QD  Active Problems:   Sepsis, ulcerative colitis  - pt did not meet criteria for sepsis on admission but this AM T is 101.2 F with HR > 90 and WBC up to 20 K - started on flagyl and cipro 12/22 - pt afebrile overnight, WBC back to normal limits     Hypokalemia - supplemented and WNL this AM    Ulcerative colitis ([place]) - per GI team, appreciate assistance    Cancer of lower-inner quadrant of left female breast  [facility]) - notified Dr. Burr Medico of pt's admission   Leukocytosis - unclear if related to acute illness and imposed on recent use of Prednisone  - resolved   DVT prophylaxis - SCD's  Code Status: Full.  Family Communication:  plan of care discussed with the patient and her sister at bedside  Disposition Plan: Home when cleared by GI team and off Solumedrol IV  IV access:  Peripheral IV  Procedures and diagnostic studies:    Ct Abdomen Pelvis Wo Contrast 04/18/2015 Scattered wall thickening of the colon from transverse colon through rectum, greatest at sigmoid colon and rectum, with associated infiltrative changes of mesocolon consistent with patient history of ulcerative colitis. Unable to exclude perirectal/perianal inflammatory process posteriorly ; recommend correlation with physical exam and proctoscopy to exclude fistula and abscess. No evidence of perforation or abscess. Question tiny hiatal hernia. Minimal pericardial effusion.   Dg Chest Port 1 View 04/19/2015  Negative for pneumonia.   Medical Consultants:  GI  Other Consultants:  None  IAnti-Infectives:   Flagyl 12/22 --> Cipro 12/22 -->  [name], MD  [facility] Pager [phone]  If 7PM-7AM, please contact night-coverage [email or web address] Password [facility] 04/23/2015, 10:47 AM   LOS: 4 days   HPI/Subjective: No events overnight. Persistent diarrhea overnight but less frequent.   Objective: Filed Vitals:   04/22/15 1953 04/22/15 2154 04/23/15 0244 04/23/15 0447  BP: 117/66   123/63  Pulse: 80 76  65  Temp: 98.3 F (36.8 C)  97.9  F (36.6 C) 97.7 F (36.5 C)  TempSrc: Oral  Axillary Oral  Resp: 18   16  Height:      Weight:      SpO2: 98%   100%    Intake/Output Summary (Last 24 hours) at 04/23/15 1047 Last data filed at 04/23/15 0646  Gross per 24 hour  Intake 3711.75 ml  Output   1500 ml  Net 2211.75 ml    Exam:   General:  Pt is alert, follows commands appropriately, not in acute  distress  Cardiovascular: Regular rate and rhythm, S1/S2, no murmurs, no rubs, no gallops  Respiratory: Clear to auscultation bilaterally, no wheezing, no crackles, no rhonchi  Abdomen: Soft, tender in lower abd quadrants, non distended, bowel sounds present, no guarding   Data Reviewed: Basic Metabolic Panel:  Recent Labs Lab 04/19/15 0349 04/20/15 0355 04/21/15 0436 04/22/15 0420 04/23/15 0422  NA 135 137 136 138 138  K 3.3* 3.5 3.5 3.5 3.7  CL 101 107 107 104 106  CO2 23 23 21* 25 25  GLUCOSE 127* 122* 100* 96 111*  BUN 7 6 7  5* 5*  CREATININE 0.77 0.65 0.48 0.59 0.64  CALCIUM 8.6* 8.6* 8.2* 8.2* 8.0*   Liver Function Tests:  Recent Labs Lab 04/18/15 1203  AST 20  ALT 12*  ALKPHOS 90  BILITOT 1.3*  PROT 6.8  ALBUMIN 3.3*   CBC:  Recent Labs Lab 04/19/15 0349 04/20/15 0355 04/21/15 0436 04/22/15 0420 04/23/15 0422  WBC 20.1* 12.9* 9.7 11.3* 8.3  HGB 13.6 11.7* 10.8* 11.7* 10.7*  HCT 41.1 34.8* 32.4* 35.6* 32.7*  MCV 92.4 91.3 92.3 92.0 91.9  PLT 259 230 233 246 245    Recent Results (from the past 240 hour(s))  C difficile quick scan w PCR reflex     Status: Abnormal   Collection Time: 04/18/15  4:38 PM  Result Value Ref Range Status   C Diff antigen INDETERMINATE (A) NEGATIVE Final   C Diff toxin INDETERMINATE (A) NEGATIVE Final   C Diff interpretation Results are indeterminate. See PCR results.  Final  Clostridium Difficile by PCR     Status: None   Collection Time: 04/18/15  4:38 PM  Result Value Ref Range Status   Toxigenic C Difficile by pcr NEGATIVE NEGATIVE Final  Culture, blood (Routine X 2) w Reflex to ID Panel     Status: None (Preliminary result)   Collection Time: 04/19/15 12:18 AM  Result Value Ref Range Status   Specimen Description BLOOD RIGHT HAND  Final   Special Requests BOTTLES DRAWN AEROBIC AND ANAEROBIC 5CC  Final   Culture   Final    NO GROWTH 4 DAYS Performed at [facility]    Report Status PENDING   Incomplete  Culture, blood (Routine X 2) w Reflex to ID Panel     Status: None (Preliminary result)   Collection Time: 04/19/15 12:25 AM  Result Value Ref Range Status   Specimen Description BLOOD RIGHT ARM  Final   Special Requests BOTTLES DRAWN AEROBIC AND ANAEROBIC 5CC  Final   Culture   Final    NO GROWTH 4 DAYS Performed at [facility]    Report Status PENDING  Incomplete  Gastrointestinal Panel by PCR , Stool     Status: None   Collection Time: 04/19/15  5:46 AM  Result Value Ref Range Status   Campylobacter species NOT DETECTED NOT DETECTED Final   Plesimonas shigelloides NOT DETECTED NOT DETECTED Final  Salmonella species NOT DETECTED NOT DETECTED Final   Yersinia enterocolitica NOT DETECTED NOT DETECTED Final   Vibrio species NOT DETECTED NOT DETECTED Final   Vibrio cholerae NOT DETECTED NOT DETECTED Final   Enteroaggregative E coli (EAEC) NOT DETECTED NOT DETECTED Final   Enteropathogenic E coli (EPEC) NOT DETECTED NOT DETECTED Final   Enterotoxigenic E coli (ETEC) NOT DETECTED NOT DETECTED Final   Shiga like toxin producing E coli (STEC) NOT DETECTED NOT DETECTED Final   E. coli O157 NOT DETECTED NOT DETECTED Final   Shigella/Enteroinvasive E coli (EIEC) NOT DETECTED NOT DETECTED Final   Cryptosporidium NOT DETECTED NOT DETECTED Final   Cyclospora cayetanensis NOT DETECTED NOT DETECTED Final   Entamoeba histolytica NOT DETECTED NOT DETECTED Final   Giardia lamblia NOT DETECTED NOT DETECTED Final   Adenovirus F40/41 NOT DETECTED NOT DETECTED Final   Astrovirus NOT DETECTED NOT DETECTED Final   Norovirus GI/GII NOT DETECTED NOT DETECTED Final   Rotavirus A NOT DETECTED NOT DETECTED Final   Sapovirus (I, II, IV, and V) NOT DETECTED NOT DETECTED Final  C difficile quick scan w PCR reflex     Status: None   Collection Time: 04/19/15 11:20 AM  Result Value Ref Range Status   C Diff antigen NEGATIVE NEGATIVE Final   C Diff toxin NEGATIVE NEGATIVE Final   C Diff  interpretation Negative for toxigenic C. difficile  Final     Scheduled Meds: . ciprofloxacin  400 mg Intravenous BID  . FLUoxetine  20 mg Oral Daily  . hydrocortisone  25 mg Rectal Daily  . mesalamine  4 g Rectal QHS  . methylPREDNISolone (SOLU-MEDROL) injection  20 mg Intravenous Q24H  . metronidazole  500 mg Intravenous 3 times per day   Continuous Infusions: . sodium chloride 1,000 mL (04/23/15 0646)

## 2015-04-23 NOTE — Progress Notes (Signed)
     [facility] Progress Note  Subjective:  Still having a lot of rectal bleeding but overall feeling a little better.  Eating some scrambled eggs and pancakes currently.  Rectal pain/tenesmus maybe slightly better.  Was not able to retain the last Rowasa enema.  Objective:  Vital signs in last 24 hours: Temp:  [97.7 F (36.5 C)-99.1 F (37.3 C)] 97.7 F (36.5 C) (12/26 0447) Pulse Rate:  [65-80] 65 (12/26 0447) Resp:  [16-18] 16 (12/26 0447) BP: (117-126)/(62-66) 123/63 mmHg (12/26 0447) SpO2:  [98 %-100 %] 100 % (12/26 0447) Last BM Date: 04/23/15 General:  Alert, Well-developed, in NAD Heart:  Regular rate and rhythm; no murmurs Pulm:  CTAB.  No W/R/R. Abdomen:  Soft, non-distended. Normal bowel sounds.  Lower abdominal TTP. Extremities:  Without edema. Neurologic:  Alert and oriented x 4; grossly normal neurologically. Psych:  Alert and cooperative. Normal mood and affect.  Intake/Output from previous day: 12/25 0701 - 12/26 0700 In: 6974.3 [P.O.:180; I.V.:4994.3; IV Piggyback:1800] Out: 1500 [Urine:1500]  Lab Results:  Recent Labs  04/21/15 0436 04/22/15 0420 04/23/15 0422  WBC 9.7 11.3* 8.3  HGB 10.8* 11.7* 10.7*  HCT 32.4* 35.6* 32.7*  PLT 233 246 245   BMET  Recent Labs  04/21/15 0436 04/22/15 0420 04/23/15 0422  NA 136 138 138  K 3.5 3.5 3.7  CL 107 104 106  CO2 21* 25 25  GLUCOSE 100* 96 111*  BUN 7 5* 5*  CREATININE 0.48 0.59 0.64  CALCIUM 8.2* 8.2* 8.0*   Assessment / Plan: *74 year old female with history of ulcerative colitis, breast cancer in remission status post surgical resection admitted with bloody diarrhea and perianal abscess.  She appears to be improving slowly.  Continue Cipro and Flagyl.  Perianal abscess was too small to drain per IR and surgery, if she develops any episodes of fever or worsening leukocytosis will obtain pelvic ultrasound to re-assess the abscess.  Continue Solu-Medrol 20 mg IV.  Continue Rowasa at  bedtime and hydrocortisone suppository in the a.m.   LOS: 4 days   ZEHR, JESSICA D.  04/23/2015, 9:17 AM  Pager number [phone]  Attending physician's note   I have taken an interval history, reviewed the chart and examined the patient. I agree with the Advanced Practitioner's note, impression and recommendations.  She seems to be improving slowly but if continues to have significant bloody diarrhea tomorrow may have to consider flex sig to assess disease activity and also trans rectal pelvic ultrasound to determine any change in size of abscess  K Denzil Magnuson, MD [phone] Mon-Fri 8a-5p [phone] after 5p, weekends, holidays

## 2015-04-23 NOTE — Plan of Care (Signed)
Problem: Bowel/Gastric: Goal: Will show no signs and symptoms of gastrointestinal bleeding Outcome: Progressing Patient reports only 4-5 bloody stools today and able to tolerate small amounts of soft foods. Denies nausea at present.

## 2015-04-24 ENCOUNTER — Inpatient Hospital Stay (HOSPITAL_COMMUNITY): Payer: Commercial Managed Care - HMO

## 2015-04-24 DIAGNOSIS — K612 Anorectal abscess: Secondary | ICD-10-CM

## 2015-04-24 DIAGNOSIS — K610 Anal abscess: Secondary | ICD-10-CM | POA: Diagnosis present

## 2015-04-24 LAB — CULTURE, BLOOD (ROUTINE X 2)
Culture: NO GROWTH
Culture: NO GROWTH

## 2015-04-24 LAB — SEDIMENTATION RATE: Sed Rate: 56 mm/hr — ABNORMAL HIGH (ref 0–22)

## 2015-04-24 LAB — C-REACTIVE PROTEIN: CRP: 7.6 mg/dL — ABNORMAL HIGH (ref <1.0)

## 2015-04-24 MED ORDER — IOHEXOL 300 MG/ML  SOLN
25.0000 mL | INTRAMUSCULAR | Status: AC
  Administered 2015-04-24 (×2): 25 mL via ORAL

## 2015-04-24 MED ORDER — MESALAMINE 1000 MG RE SUPP
1000.0000 mg | Freq: Two times a day (BID) | RECTAL | Status: DC
  Administered 2015-04-24 – 2015-04-27 (×4): 1000 mg via RECTAL
  Filled 2015-04-24 (×10): qty 1

## 2015-04-24 MED ORDER — IOHEXOL 300 MG/ML  SOLN
75.0000 mL | Freq: Once | INTRAMUSCULAR | Status: AC | PRN
  Administered 2015-04-24: 75 mL via INTRAVENOUS

## 2015-04-24 MED ORDER — ENOXAPARIN SODIUM 40 MG/0.4ML ~~LOC~~ SOLN
40.0000 mg | SUBCUTANEOUS | Status: DC
  Administered 2015-04-24 – 2015-04-26 (×3): 40 mg via SUBCUTANEOUS
  Filled 2015-04-24 (×3): qty 0.4

## 2015-04-24 MED ORDER — ALPRAZOLAM 0.5 MG PO TABS
0.5000 mg | ORAL_TABLET | Freq: Two times a day (BID) | ORAL | Status: DC | PRN
  Administered 2015-04-24: 0.5 mg via ORAL
  Filled 2015-04-24: qty 1

## 2015-04-24 NOTE — Progress Notes (Signed)
Per Dr.Armbruster and Dr.Myers the lovenox sub-q is to be administered due to prophylaxis of DVT even though patient is having rectal bleeding. Explained to patient and sister, they are in agreement.

## 2015-04-24 NOTE — Progress Notes (Addendum)
Patient ID: Sara Barnes, female   DOB: 03/09/1941, 74 y.o.   MRN: [id number]  [facility] PROGRESS NOTE  Sara Barnes ZGY:[id number] DOB: Aug 11, 1940 DOA: 04/18/2015 PCP: Sara Koyanagi, DO   Brief narrative:    74 y.o. female with known ulcerative colitis, breast cancer in remission, no chemotherapy (this was a surgical excision with radiation several months ago, was on Femara per Sara Barnes)), presented to [facility] ED with main concern of more than two weeks duration of progressively worsening diarrhea initially with bright read blood in the stool but has become progressively darker in the past several days. Pt reports over 12 episodes per day. This has been associated with intermittent tenesmus, rectal pain that is sharp and 10/10 in severity when present, occasionally but not consistently radiating to lower abd quadrants and with no specific alleviating factors. Pt denies fevers chills, no chest pain or shortness of breath.   In ED, pt noted to be hemodynamically stable, VSS. [facility] asked to admit for further evaluation.  Hospital course complicated by persistent diarrheas, requiring IV solumedrol. GI team following as well and may consider fles sig in 24 - 48 hours based on clinical improvement.   Assessment/Plan:    Principal Problem:  Rectal bleeding with N/V/D - C. Diff negative, stool studies negative - related to UC flare, appreciate GI team following  - N/V resolved, diarrhea improving  - pt has history of pelvic abscess related to UC in 2015 and now has small peri-anal abscess - per Barnes, no intervention needed at this time - pt reports feeling better this AM, less frequent diarrheas - continue with Cipro and Flagyl, low dose solumedrol 20 mg IV QD - follow up on recommendations per GI team   Active Problems:   Sepsis, ulcerative colitis  - pt did not meet criteria for sepsis on admission but 12/22 T max 101.2 F with HR > 90 and WBC up to 20 K - started on flagyl and cipro  12/22 - pt afebrile overnight, WBC back to normal limits     Hypokalemia - supplemented    Ulcerative colitis (Sara Barnes) - per GI team, appreciate assistance    Cancer of lower-inner quadrant of left female breast [facility]) - notified Sara Barnes of pt's admission   Leukocytosis - unclear if related to acute illness and imposed on recent use of Prednisone  - resolved   DVT prophylaxis - SCD's  Code Status: Full.  Family Communication:  plan of care discussed with the patient and her sister at bedside  Disposition Plan: Home when cleared by GI team and off Solumedrol IV  IV access:  Peripheral IV  Procedures and diagnostic studies:    Ct Abdomen Pelvis Wo Contrast 04/18/2015 Scattered wall thickening of the colon from transverse colon through rectum, greatest at sigmoid colon and rectum, with associated infiltrative changes of mesocolon consistent with patient history of ulcerative colitis. Unable to exclude perirectal/perianal inflammatory process posteriorly ; recommend correlation with physical exam and proctoscopy to exclude fistula and abscess. No evidence of perforation or abscess. Question tiny hiatal hernia. Minimal pericardial effusion.   Dg Chest Port 1 View 04/19/2015  Negative for pneumonia.   Medical Consultants:  GI  Other Consultants:  None  IAnti-Infectives:   Flagyl 12/22 --> Cipro 12/22 -->  Sara Ramsay, MD  Sara Barnes Pager [phone]  If 7PM-7AM, please contact night-coverage [email or web address] Password [email or web address] 04/24/2015, 9:09 AM   LOS: 5 days   HPI/Subjective: No events overnight. Persistent diarrhea overnight but less frequent.  Objective: Filed Vitals:   04/23/15 0447 04/23/15 1319 04/23/15 2221 04/24/15 0629  BP: 123/63 115/63 112/65 107/52  Pulse: 65 64 40 58  Temp: 97.7 F (36.5 C) 98.1 F (36.7 C) 98.8 F (37.1 C) 97.9 F (36.6 C)  TempSrc: Oral Oral Oral Oral  Resp: 16 16 20 18   Height:      Weight:      SpO2: 100% 99% 97% 95%     Intake/Output Summary (Last 24 hours) at 04/24/15 0909 Last data filed at 04/24/15 0748  Gross per 24 hour  Intake 3187.5 ml  Output      0 ml  Net 3187.5 ml    Exam:   General:  Pt is alert, follows commands appropriately, not in acute distress  Cardiovascular: Regular rate and rhythm, S1/S2, no murmurs, no rubs, no gallops  Respiratory: Clear to auscultation bilaterally, no wheezing, no crackles, no rhonchi  Abdomen: Soft, tender in lower abd quadrants, non distended, bowel sounds present, no guarding   Data Reviewed: Basic Metabolic Panel:  Recent Labs Lab 04/19/15 0349 04/20/15 0355 04/21/15 0436 04/22/15 0420 04/23/15 0422  NA 135 137 136 138 138  K 3.3* 3.5 3.5 3.5 3.7  CL 101 107 107 104 106  CO2 23 23 21* 25 25  GLUCOSE 127* 122* 100* 96 111*  BUN 7 6 7  5* 5*  CREATININE 0.77 0.65 0.48 0.59 0.64  CALCIUM 8.6* 8.6* 8.2* 8.2* 8.0*   Liver Function Tests:  Recent Labs Lab 04/18/15 1203  AST 20  ALT 12*  ALKPHOS 90  BILITOT 1.3*  PROT 6.8  ALBUMIN 3.3*   CBC:  Recent Labs Lab 04/19/15 0349 04/20/15 0355 04/21/15 0436 04/22/15 0420 04/23/15 0422  WBC 20.1* 12.9* 9.7 11.3* 8.3  HGB 13.6 11.7* 10.8* 11.7* 10.7*  HCT 41.1 34.8* 32.4* 35.6* 32.7*  MCV 92.4 91.3 92.3 92.0 91.9  PLT 259 230 233 246 245    Recent Results (from the past 240 hour(s))  C difficile quick scan w PCR reflex     Status: Abnormal   Collection Time: 04/18/15  4:38 PM  Result Value Ref Range Status   C Diff antigen INDETERMINATE (A) NEGATIVE Final   C Diff toxin INDETERMINATE (A) NEGATIVE Final   C Diff interpretation Results are indeterminate. See PCR results.  Final  Clostridium Difficile by PCR     Status: None   Collection Time: 04/18/15  4:38 PM  Result Value Ref Range Status   Toxigenic C Difficile by pcr NEGATIVE NEGATIVE Final  Culture, blood (Routine X 2) w Reflex to ID Panel     Status: None   Collection Time: 04/19/15 12:18 AM  Result Value Ref Range  Status   Specimen Description BLOOD RIGHT HAND  Final   Special Requests BOTTLES DRAWN AEROBIC AND ANAEROBIC 5CC  Final   Culture   Final    NO GROWTH 5 DAYS Performed at [facility]    Report Status 04/24/2015 FINAL  Final  Culture, blood (Routine X 2) w Reflex to ID Panel     Status: None   Collection Time: 04/19/15 12:25 AM  Result Value Ref Range Status   Specimen Description BLOOD RIGHT ARM  Final   Special Requests BOTTLES DRAWN AEROBIC AND ANAEROBIC 5CC  Final   Culture   Final    NO GROWTH 5 DAYS Performed at [facility]    Report Status 04/24/2015 FINAL  Final  Gastrointestinal Panel by PCR , Stool  Status: None   Collection Time: 04/19/15  5:46 AM  Result Value Ref Range Status   Campylobacter species NOT DETECTED NOT DETECTED Final   Plesimonas shigelloides NOT DETECTED NOT DETECTED Final   Salmonella species NOT DETECTED NOT DETECTED Final   Yersinia enterocolitica NOT DETECTED NOT DETECTED Final   Vibrio species NOT DETECTED NOT DETECTED Final   Vibrio cholerae NOT DETECTED NOT DETECTED Final   Enteroaggregative E coli (EAEC) NOT DETECTED NOT DETECTED Final   Enteropathogenic E coli (EPEC) NOT DETECTED NOT DETECTED Final   Enterotoxigenic E coli (ETEC) NOT DETECTED NOT DETECTED Final   Shiga like toxin producing E coli (STEC) NOT DETECTED NOT DETECTED Final   E. coli O157 NOT DETECTED NOT DETECTED Final   Shigella/Enteroinvasive E coli (EIEC) NOT DETECTED NOT DETECTED Final   Cryptosporidium NOT DETECTED NOT DETECTED Final   Cyclospora cayetanensis NOT DETECTED NOT DETECTED Final   Entamoeba histolytica NOT DETECTED NOT DETECTED Final   Giardia lamblia NOT DETECTED NOT DETECTED Final   Adenovirus F40/41 NOT DETECTED NOT DETECTED Final   Astrovirus NOT DETECTED NOT DETECTED Final   Norovirus GI/GII NOT DETECTED NOT DETECTED Final   Rotavirus A NOT DETECTED NOT DETECTED Final   Sapovirus (I, II, IV, and V) NOT DETECTED NOT DETECTED Final  C  difficile quick scan w PCR reflex     Status: None   Collection Time: 04/19/15 11:20 AM  Result Value Ref Range Status   C Diff antigen NEGATIVE NEGATIVE Final   C Diff toxin NEGATIVE NEGATIVE Final   C Diff interpretation Negative for toxigenic C. difficile  Final     Scheduled Meds: . ciprofloxacin  400 mg Intravenous BID  . FLUoxetine  20 mg Oral Daily  . hydrocortisone  25 mg Rectal Daily  . mesalamine  4 g Rectal QHS  . methylPREDNISolone (SOLU-MEDROL) injection  20 mg Intravenous Q24H  . metronidazole  500 mg Intravenous 3 times per day   Continuous Infusions: . sodium chloride 1,000 mL (04/23/15 0646)

## 2015-04-24 NOTE — Progress Notes (Signed)
Progress Note   Subjective  Patient reports she slowly continues to feel improved. She reports decreased stool frequency although unclear how many times she has gone in the last 24 hours, reports it is < 10 at most, improved from admission. No significant abdominal pains. She does have some intermittent perianal discomfort which she reports is also improved from previous. No fevers. She is a new patient to me today. We spent some time discussing her IBD history. She reports she has had 2 perirectal abscesses in the past, hospitalized last year for a similar issue. She has only been on sulfasalazine and variable doses of prednisone historically. She reports intolerance to mesalamine in the past, caused nausea and vomiting and she could not tolerate it. She has never been on thiopurines, anti-TNF agents, etc.    Objective   Vital signs in last 24 hours: Temp:  [97.9 F (36.6 C)-98.8 F (37.1 C)] 97.9 F (36.6 C) (12/27 0629) Pulse Rate:  [40-64] 58 (12/27 0629) Resp:  [16-20] 18 (12/27 0629) BP: (107-115)/(52-65) 107/52 mmHg (12/27 0629) SpO2:  [95 %-99 %] 95 % (12/27 0629) Last BM Date: 04/23/15 General:    white female in NAD Heart:  Regular rate and rhythm; no murmurs Lungs: Respirations even and unlabored, lungs CTA bilaterally Abdomen:  Soft, nontender and nondistended. Normal bowel sounds. Extremities:  Trace edema. Neurologic:  Alert and oriented,  grossly normal neurologically. Psych:  Cooperative. Normal mood and affect.  Intake/Output from previous day: 12/26 0701 - 12/27 0700 In: 2947.5 [P.O.:480; I.V.:1767.5; IV Piggyback:700] Out: -  Intake/Output this shift: Total I/O In: 806.7 [P.O.:240; I.V.:266.7; IV Piggyback:300] Out: -   Lab Results:  Recent Labs  04/22/15 0420 04/23/15 0422  WBC 11.3* 8.3  HGB 11.7* 10.7*  HCT 35.6* 32.7*  PLT 246 245   BMET  Recent Labs  04/22/15 0420 04/23/15 0422  NA 138 138  K 3.5 3.7  CL 104 106  CO2 25 25    GLUCOSE 96 111*  BUN 5* 5*  CREATININE 0.59 0.64  CALCIUM 8.2* 8.0*   LFT No results for input(s): PROT, ALBUMIN, AST, ALT, ALKPHOS, BILITOT, BILIDIR, IBILI in the last 72 hours. PT/INR No results for input(s): LABPROT, INR in the last 72 hours.  Studies/Results: No results found.     Assessment / Plan:   74 y/o female with a reported history of longstanding ulcerative colitis, with recent diagnosis and treatment of breast cancer this past summer, who was admitted with a colitis flare and also found to have perirectal abscess. This is the second perirectal abscess she has had in the past year. She has been managed as an outpatient with sulfasalazine and intermittent doses of prednisone, and reports an intolerance to mesalamine.   Overall, she is improved on IV antibiotics for her small perirectal abscess and on IV solumedrol (45m) for her colitis flare. I discussed her recent history with her, and long term treatment plan. Given her recurrent perirectal abscess and remote report of a colonic stricture, I suspect she may more likely have Crohn's disease and not UC.   At this time, I would recommend trending daily CRP to ensure this is going in the right direction, and repeat CBC to ensure stable. Given her ongoing rectal pain, I recommend some imaging to make sure the abscess is improving. She cannot have an MRI due metal in her ear, thus would recommend trans-anal [place] CT of the pelvis. If the abscess is enlarging we will  need to discuss possible drainage with surgery or IR. If the abscess is improved / resolved, we can continue present management for now.   Longer term, for management of her colitis, given her failure of sulfasalazine and intolerance to mesalamine, she warrants escalation of her medical regimen in light of these recent events and need to avoid long term steroids. I discussed anti-TNF therapy with her today, and other options include Entyvio or thiopurines. Given her acute  colitis, will obtain quantiferon gold testing / hepatitis testing to ensure negative in case we need to use Remicade this admission. I have discussed risks / benefits of this, and will continue to engage her about this to see if she would be amenable to anti-TNF. Other options include transitioning her to oral steroid (prednisone or Uceris) as a bridge and use Entyvio, which is thought to be a safer regimen but will take 4-6 weeks to reach effect.   We will await blood work and imaging today, and continue present medications. I will stop Rowasa enemas as she has not been able to retain then and switch this to Canasa suppositories. Further recommendations pending results of imaging, labs, and course.   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

## 2015-04-25 LAB — BASIC METABOLIC PANEL
Anion gap: 6 (ref 5–15)
BUN: 5 mg/dL — AB (ref 6–20)
CHLORIDE: 108 mmol/L (ref 101–111)
CO2: 26 mmol/L (ref 22–32)
Calcium: 8.1 mg/dL — ABNORMAL LOW (ref 8.9–10.3)
Creatinine, Ser: 0.53 mg/dL (ref 0.44–1.00)
GFR calc Af Amer: >60 mL/min (ref >60)
GFR calc non Af Amer: >60 mL/min (ref >60)
GLUCOSE: 87 mg/dL (ref 65–99)
POTASSIUM: 3.2 mmol/L — AB (ref 3.5–5.1)
Sodium: 140 mmol/L (ref 135–145)

## 2015-04-25 LAB — CBC
HEMATOCRIT: 31.6 % — AB (ref 36.0–46.0)
HEMOGLOBIN: 10.7 g/dL — AB (ref 12.0–15.0)
MCH: 31.1 pg (ref 26.0–34.0)
MCHC: 33.9 g/dL (ref 30.0–36.0)
MCV: 91.9 fL (ref 78.0–100.0)
PLATELETS: 268 10*3/uL (ref 150–400)
RBC: 3.44 MIL/uL — AB (ref 3.87–5.11)
RDW: 12.7 % (ref 11.5–15.5)
WBC: 6.2 10*3/uL (ref 4.0–10.5)

## 2015-04-25 LAB — HEPATITIS PANEL, ACUTE
HEP A IGM: NEGATIVE
Hep B C IgM: NEGATIVE
Hepatitis B Surface Ag: NEGATIVE

## 2015-04-25 MED ORDER — DICYCLOMINE HCL 10 MG PO CAPS
10.0000 mg | ORAL_CAPSULE | Freq: Three times a day (TID) | ORAL | Status: DC
  Administered 2015-04-25 – 2015-04-27 (×5): 10 mg via ORAL
  Filled 2015-04-25 (×9): qty 1

## 2015-04-25 MED ORDER — POTASSIUM CHLORIDE IN NACL 20-0.45 MEQ/L-% IV SOLN
INTRAVENOUS | Status: DC
  Administered 2015-04-25 – 2015-04-26 (×2): via INTRAVENOUS
  Filled 2015-04-25 (×5): qty 1000

## 2015-04-25 MED ORDER — POTASSIUM CHLORIDE CRYS ER 20 MEQ PO TBCR
40.0000 meq | EXTENDED_RELEASE_TABLET | Freq: Once | ORAL | Status: AC
  Administered 2015-04-25: 40 meq via ORAL
  Filled 2015-04-25: qty 2

## 2015-04-25 MED ORDER — METHYLPREDNISOLONE SODIUM SUCC 40 MG IJ SOLR: 40.0000 mg | INTRAMUSCULAR | Status: DC

## 2015-04-25 NOTE — Progress Notes (Signed)
Progress Note   Subjective  Patient had a CT pelvis yesterday showing interval resolution of the abscess. She had labs showing CRP of 7 with ESR in 50s. Overall she states she continues to feel improved however drinking the contrast yesterday caused her to have diarrhea so unclear of her true stool frequency. She continues to have some blood in her stools.   Objective   Vital signs in last 24 hours: Temp:  [97.9 F (36.6 C)-98 F (36.7 C)] 97.9 F (36.6 C) (12/28 0601) Pulse Rate:  [69] 69 (12/28 0601) Resp:  [16-18] 18 (12/28 0601) BP: (114-127)/(61-67) 127/67 mmHg (12/28 0601) SpO2:  [98 %] 98 % (12/28 0601) Last BM Date: 04/25/15 General:    white female in NAD Heart:  Regular rate and rhythm; no murmurs Lungs: Respirations even and unlabored, lungs CTA bilaterally Abdomen:  Soft, nontender and nondistended. Normal bowel sounds. Extremities:  Without edema. Neurologic:  Alert and oriented,  grossly normal neurologically. Psych:  Cooperative. Normal mood and affect.  Intake/Output from previous day: 12/27 0701 - 12/28 0700 In: 3238.7 [P.O.:1242; I.V.:1296.7; IV Piggyback:700] Out: 600 [Urine:600] Intake/Output this shift:    Lab Results:  Recent Labs  04/23/15 0422 04/25/15 0410  WBC 8.3 6.2  HGB 10.7* 10.7*  HCT 32.7* 31.6*  PLT 245 268   BMET  Recent Labs  04/23/15 0422 04/25/15 0410  NA 138 140  K 3.7 3.2*  CL 106 108  CO2 25 26  GLUCOSE 111* 87  BUN 5* 5*  CREATININE 0.64 0.53  CALCIUM 8.0* 8.1*   LFT No results for input(s): PROT, ALBUMIN, AST, ALT, ALKPHOS, BILITOT, BILIDIR, IBILI in the last 72 hours. PT/INR No results for input(s): LABPROT, INR in the last 72 hours.  Studies/Results: Ct Pelvis W Contrast  04/24/2015  CLINICAL DATA:  Ulcerative colitis, perirectal abscess. EXAM: CT PELVIS WITH CONTRAST TECHNIQUE: Multidetector CT imaging of the pelvis was performed using the standard protocol following the bolus administration of  intravenous contrast. CONTRAST:  76m OMNIPAQUE IOHEXOL 300 MG/ML  SOLN COMPARISON:  04/19/2015. FINDINGS: There is thickening of the visualized portions of the descending and majority of the rectosigmoid colon. There is minimal stranding adjacent to the rectum/anus, without a discrete collection of fluid and/or air to indicate a residual abscess. Visualized portions of the cecum and ascending colon are within normal limits, as is visualized small bowel. Vascular structures are unremarkable. Hysterectomy. Ovaries are visualized. No free fluid. No pathologically enlarged lymph nodes. No worrisome lytic or sclerotic lesions. Degenerative changes are seen in the spine. IMPRESSION: Persistent thickening of the descending and rectosigmoid colon. Resolved perirectal/perianal abscess. Electronically Signed   By: MLorin PicketM.D.   On: 04/24/2015 15:52       Assessment / Plan:   74y/o female with a reported history of longstanding ulcerative colitis, with recent diagnosis and treatment of breast cancer this past summer, who was admitted with a colitis flare and also found to have perirectal abscess. This is the second perirectal abscess she has had in the past year. She has been managed as an outpatient with sulfasalazine and intermittent doses of prednisone, and reports an intolerance to mesalamine. Given her recurrent perirectal abscess and remote report of a colonic stricture, I suspect she may more likely have Crohn's disease and not UC.  Overall, she is improved on IV antibiotics for her small perirectal abscess and on IV solumedrol (23m for her colitis flare. I discussed her recent history with her,  and long term treatment plan. Her CT shows interval resolution of the abscess which is excellent news. While she is improved her colitis remains active, although unclear stool frequency as of this morning given she had diarrhea from the contrast yesterday.    Longer term, for management of her colitis,  given her failure of sulfasalazine and intolerance to mesalamine, she warrants escalation of her medical regimen in light of these recent events and need to avoid long term steroids. I spent > 30 minutes today discussing long term treatment options to include anti-TNF therapy, Entyvio, thiopurines, and discussed colectomy as well. We are awaiting TB Testing in case we need to use Remicade this admission. I have discussed risks / benefits of all of these therapies. She initially stated she did not want any of them, but counseled her again that long term steroids is not a good option and prior mesalamine / sulfasalazine has failed. Upon discussing these options, her preference if she improves with steroids, is to proceed with Entyvio. I suspect she will need a steroid bridge for 4-6 weeks until Entyvio takes affect. We will look into her insurance to see if this is covered. If she does not improve however while hospitalized on present dose of steroids, we may need to increase her solumedrol and consider Remicade while inpatient.   She verbalized understanding of the issues at hand. We can't proceed with either until quantiferon returns or PPD is placed. We will await her course this evening and reassess in the morning. If her diarrhea is not significantly improved will consider increase in steroids and or start Remicade in the next few days. If she is improved, can consider discharge on prednisone taper and infuse with [facility] as outpatient.  [place] Cellar, MD [facility] Pager [phone]     LOS: 6 days   Sara Barnes  04/25/2015, 4:34 PM

## 2015-04-25 NOTE — Care Management Important Message (Signed)
Important Message  Patient Details  Name: Sara Barnes MRN: [id number] Date of Birth: April 25, 1941   Medicare Important Message Given:  Yes    [name] 04/25/2015, 11:14 AMImportant Message  Patient Details  Name: Sara Barnes MRN: [id number] Date of Birth: 05-28-1940   Medicare Important Message Given:  Yes    [name] 04/25/2015, 11:14 AM

## 2015-04-25 NOTE — Progress Notes (Signed)
Patient ID: Sara Barnes, female   DOB: [birth year], 74 y.o.   MRN: [id number]  [facility] PROGRESS NOTE  Sara Barnes TIR:[id number] DOB: 10-16-1940 DOA: 04/18/2015 PCP: [name], DO   Brief narrative:    74 y.o. female with known ulcerative colitis, breast cancer in remission, no chemotherapy (this was a surgical excision with radiation several months ago, was on Femara per Dr. Burr Medico)), presented to [facility] ED with main concern of more than two weeks duration of progressively worsening diarrhea initially with bright read blood in the stool but has become progressively darker in the past several days. Pt reports over 12 episodes per day. This has been associated with intermittent tenesmus, rectal pain that is sharp and 10/10 in severity when present, occasionally but not consistently radiating to lower abd quadrants and with no specific alleviating factors. Pt denies fevers chills, no chest pain or shortness of breath.   In ED, pt noted to be hemodynamically stable, VSS. TRH asked to admit for further evaluation.  Hospital course complicated by persistent diarrheas, requiring IV solumedrol. GI team following as well and may consider fles sig in 24 - 48 hours based on clinical improvement.   Assessment/Plan:    Principal Problem:  Rectal bleeding with N/V/D - C. Diff negative, stool studies negative - related to UC flare, appreciate GI team following  - N/V resolved, diarrhea improving  - pt has history of pelvic abscess related to UC in 2015 and now has small peri-anal abscess - per surgery, no intervention needed at this time, repeat Ct pelvis notes resolved abscess  - pt reports feeling better this AM, less frequent diarrheas - continue with Cipro and Flagyl, low dose solumedrol 20 mg IV QD - pt asking to hold off on Remicade or Entivio   Active Problems:   Sepsis, ulcerative colitis  - pt did not meet criteria for sepsis on admission but 12/22 T max 101.2 F with HR > 90 and  WBC up to 20 K - started on flagyl and cipro 12/22 - pt afebrile overnight, WBC remains normal limits     Hypokalemia - supplement and repeat BMP In AM   Ulcerative colitis ([place]) - per GI team, appreciate assistance    Cancer of lower-inner quadrant of left female breast ([place]) - notified Dr. Burr Medico of pt's admission   Leukocytosis - unclear if related to acute illness and imposed on recent use of Prednisone  - resolved   DVT prophylaxis - SCD's  Code Status: Full.  Family Communication:  plan of care discussed with the patient and her sister at bedside  Disposition Plan: Home when cleared by GI team and off Solumedrol IV  IV access:  Peripheral IV  Procedures and diagnostic studies:    Ct Abdomen Pelvis Wo Contrast 04/18/2015 Scattered wall thickening of the colon from transverse colon through rectum, greatest at sigmoid colon and rectum, with associated infiltrative changes of mesocolon consistent with patient history of ulcerative colitis. Unable to exclude perirectal/perianal inflammatory process posteriorly ; recommend correlation with physical exam and proctoscopy to exclude fistula and abscess. No evidence of perforation or abscess. Question tiny hiatal hernia. Minimal pericardial effusion.   Dg Chest Port 1 View 04/19/2015  Negative for pneumonia.   Medical Consultants:  GI  Other Consultants:  None  IAnti-Infectives:   Flagyl 12/22 --> Cipro 12/22 -->  [name], MD  [facility] Pager [phone]  If 7PM-7AM, please contact night-coverage [email or web address] Password [email or web address] 04/25/2015, 9:33 AM   LOS: 6 days  HPI/Subjective: No events overnight. Persistent diarrhea overnight but less frequent.   Objective: Filed Vitals:   04/24/15 0629 04/24/15 1337 04/24/15 2044 04/25/15 0601  BP: 107/52 130/74 114/61 127/67  Pulse: 58 77 69 69  Temp: 97.9 F (36.6 C) 97.7 F (36.5 C) 98 F (36.7 C) 97.9 F (36.6 C)  TempSrc: Oral Oral Oral Oral  Resp: 18 18 16  18   Height:      Weight:      SpO2: 95% 100% 98% 98%    Intake/Output Summary (Last 24 hours) at 04/25/15 0933 Last data filed at 04/25/15 0636  Gross per 24 hour  Intake 2998.67 ml  Output    600 ml  Net 2398.67 ml    Exam:   General:  Pt is alert, follows commands appropriately, not in acute distress  Cardiovascular: Regular rate and rhythm, S1/S2, no murmurs, no rubs, no gallops  Respiratory: Clear to auscultation bilaterally, no wheezing, no crackles, no rhonchi  Abdomen: Soft, tender in lower abd quadrants, non distended, bowel sounds present, no guarding   Data Reviewed: Basic Metabolic Panel:  Recent Labs Lab 04/20/15 0355 04/21/15 0436 04/22/15 0420 04/23/15 0422 04/25/15 0410  NA 137 136 138 138 140  K 3.5 3.5 3.5 3.7 3.2*  CL 107 107 104 106 108  CO2 23 21* 25 25 26   GLUCOSE 122* 100* 96 111* 87  BUN 6 7 5* 5* 5*  CREATININE 0.65 0.48 0.59 0.64 0.53  CALCIUM 8.6* 8.2* 8.2* 8.0* 8.1*   Liver Function Tests:  Recent Labs Lab 04/18/15 1203  AST 20  ALT 12*  ALKPHOS 90  BILITOT 1.3*  PROT 6.8  ALBUMIN 3.3*   CBC:  Recent Labs Lab 04/20/15 0355 04/21/15 0436 04/22/15 0420 04/23/15 0422 04/25/15 0410  WBC 12.9* 9.7 11.3* 8.3 6.2  HGB 11.7* 10.8* 11.7* 10.7* 10.7*  HCT 34.8* 32.4* 35.6* 32.7* 31.6*  MCV 91.3 92.3 92.0 91.9 91.9  PLT 230 233 246 245 268    Recent Results (from the past 240 hour(s))  C difficile quick scan w PCR reflex     Status: Abnormal   Collection Time: 04/18/15  4:38 PM  Result Value Ref Range Status   C Diff antigen INDETERMINATE (A) NEGATIVE Final   C Diff toxin INDETERMINATE (A) NEGATIVE Final   C Diff interpretation Results are indeterminate. See PCR results.  Final  Clostridium Difficile by PCR     Status: None   Collection Time: 04/18/15  4:38 PM  Result Value Ref Range Status   Toxigenic C Difficile by pcr NEGATIVE NEGATIVE Final  Culture, blood (Routine X 2) w Reflex to ID Panel     Status: None    Collection Time: 04/19/15 12:18 AM  Result Value Ref Range Status   Specimen Description BLOOD RIGHT HAND  Final   Special Requests BOTTLES DRAWN AEROBIC AND ANAEROBIC 5CC  Final   Culture   Final    NO GROWTH 5 DAYS Performed at [facility]    Report Status 04/24/2015 FINAL  Final  Culture, blood (Routine X 2) w Reflex to ID Panel     Status: None   Collection Time: 04/19/15 12:25 AM  Result Value Ref Range Status   Specimen Description BLOOD RIGHT ARM  Final   Special Requests BOTTLES DRAWN AEROBIC AND ANAEROBIC 5CC  Final   Culture   Final    NO GROWTH 5 DAYS Performed at [facility]    Report Status 04/24/2015 FINAL  Final  Gastrointestinal Panel by PCR , Stool     Status: None   Collection Time: 04/19/15  5:46 AM  Result Value Ref Range Status   Campylobacter species NOT DETECTED NOT DETECTED Final   Plesimonas shigelloides NOT DETECTED NOT DETECTED Final   Salmonella species NOT DETECTED NOT DETECTED Final   Yersinia enterocolitica NOT DETECTED NOT DETECTED Final   Vibrio species NOT DETECTED NOT DETECTED Final   Vibrio cholerae NOT DETECTED NOT DETECTED Final   Enteroaggregative E coli (EAEC) NOT DETECTED NOT DETECTED Final   Enteropathogenic E coli (EPEC) NOT DETECTED NOT DETECTED Final   Enterotoxigenic E coli (ETEC) NOT DETECTED NOT DETECTED Final   Shiga like toxin producing E coli (STEC) NOT DETECTED NOT DETECTED Final   E. coli O157 NOT DETECTED NOT DETECTED Final   Shigella/Enteroinvasive E coli (EIEC) NOT DETECTED NOT DETECTED Final   Cryptosporidium NOT DETECTED NOT DETECTED Final   Cyclospora cayetanensis NOT DETECTED NOT DETECTED Final   Entamoeba histolytica NOT DETECTED NOT DETECTED Final   Giardia lamblia NOT DETECTED NOT DETECTED Final   Adenovirus F40/41 NOT DETECTED NOT DETECTED Final   Astrovirus NOT DETECTED NOT DETECTED Final   Norovirus GI/GII NOT DETECTED NOT DETECTED Final   Rotavirus A NOT DETECTED NOT DETECTED Final   Sapovirus  (I, II, IV, and V) NOT DETECTED NOT DETECTED Final  C difficile quick scan w PCR reflex     Status: None   Collection Time: 04/19/15 11:20 AM  Result Value Ref Range Status   C Diff antigen NEGATIVE NEGATIVE Final   C Diff toxin NEGATIVE NEGATIVE Final   C Diff interpretation Negative for toxigenic C. difficile  Final     Scheduled Meds: . ciprofloxacin  400 mg Intravenous BID  . enoxaparin (LOVENOX) injection  40 mg Subcutaneous Q24H  . FLUoxetine  20 mg Oral Daily  . hydrocortisone  25 mg Rectal Daily  . mesalamine  1,000 mg Rectal BID  . methylPREDNISolone (SOLU-MEDROL) injection  20 mg Intravenous Q24H  . metronidazole  500 mg Intravenous 3 times per day   Continuous Infusions: . sodium chloride 50 mL/hr at 04/24/15 0940

## 2015-04-26 LAB — BASIC METABOLIC PANEL
Anion gap: 10 (ref 5–15)
CO2: 25 mmol/L (ref 22–32)
Calcium: 8.2 mg/dL — ABNORMAL LOW (ref 8.9–10.3)
Chloride: 102 mmol/L (ref 101–111)
Creatinine, Ser: 0.5 mg/dL (ref 0.44–1.00)
GFR calc Af Amer: >60 mL/min (ref >60)
GLUCOSE: 99 mg/dL (ref 65–99)
POTASSIUM: 3.5 mmol/L (ref 3.5–5.1)
Sodium: 137 mmol/L (ref 135–145)

## 2015-04-26 LAB — CBC
HCT: 36.4 % (ref 36.0–46.0)
Hemoglobin: 12.2 g/dL (ref 12.0–15.0)
MCH: 30.7 pg (ref 26.0–34.0)
MCHC: 33.5 g/dL (ref 30.0–36.0)
MCV: 91.5 fL (ref 78.0–100.0)
PLATELETS: 332 10*3/uL (ref 150–400)
RBC: 3.98 MIL/uL (ref 3.87–5.11)
RDW: 12.6 % (ref 11.5–15.5)
WBC: 9.2 10*3/uL (ref 4.0–10.5)

## 2015-04-26 LAB — QUANTIFERON IN TUBE
QFT TB AG MINUS NIL VALUE: 0 [IU]/mL
QUANTIFERON MITOGEN VALUE: 0.72 [IU]/mL
QUANTIFERON TB AG VALUE: 0.04 IU/mL
QUANTIFERON TB GOLD: NEGATIVE
Quantiferon Nil Value: 0.04 IU/mL

## 2015-04-26 LAB — QUANTIFERON TB GOLD ASSAY (BLOOD)

## 2015-04-26 MED ORDER — CIPROFLOXACIN HCL 500 MG PO TABS
500.0000 mg | ORAL_TABLET | Freq: Two times a day (BID) | ORAL | Status: DC
  Administered 2015-04-26 – 2015-04-27 (×3): 500 mg via ORAL
  Filled 2015-04-26 (×3): qty 1

## 2015-04-26 MED ORDER — METRONIDAZOLE 500 MG PO TABS
500.0000 mg | ORAL_TABLET | Freq: Two times a day (BID) | ORAL | Status: DC
  Administered 2015-04-26 – 2015-04-27 (×3): 500 mg via ORAL
  Filled 2015-04-26 (×3): qty 1

## 2015-04-26 MED ORDER — PREDNISONE 20 MG PO TABS
40.0000 mg | ORAL_TABLET | Freq: Every day | ORAL | Status: DC
  Administered 2015-04-26 – 2015-04-27 (×2): 40 mg via ORAL
  Filled 2015-04-26 (×2): qty 2

## 2015-04-26 NOTE — Progress Notes (Signed)
Patient ID: Sara Barnes, female   DOB: Aug 27, 1940, 74 y.o.   MRN: [id number]  [facility] PROGRESS NOTE  Sara Barnes VQQ:[id number] DOB: [birth year] DOA: 04/18/2015 PCP: [name], DO   Brief narrative:    74 y.o. female with known ulcerative colitis, breast cancer in remission, no chemotherapy (this was a surgical excision with radiation several months ago, was on Femara per Dr. Burr Medico)), presented to [facility] ED with main concern of more than two weeks duration of progressively worsening diarrhea initially with bright read blood in the stool but has become progressively darker in the past several days. Pt reports over 12 episodes per day. This has been associated with intermittent tenesmus, rectal pain that is sharp and 10/10 in severity when present, occasionally but not consistently radiating to lower abd quadrants and with no specific alleviating factors. Pt denies fevers chills, no chest pain or shortness of breath.   In ED, pt noted to be hemodynamically stable, VSS. TRH asked to admit for further evaluation.  Hospital course complicated by persistent diarrheas, requiring IV solumedrol. GI team following as well and may consider fles sig in 24 - 48 hours based on clinical improvement.   Assessment/Plan:    Principal Problem:  Rectal bleeding with N/V/D - C. Diff negative, stool studies negative - related to UC flare, appreciate GI team following  - N/V resolved, diarrhea improving  - pt has history of pelvic abscess related to UC in 2015 and now has small peri-anal abscess - per surgery, no intervention needed at this time, repeat Ct pelvis notes resolved abscess  - pt reports feeling better this AM, less frequent diarrheas - per GI team, plan to transition to PO regimen including Prednisone and ABX - if pt tolerating well, can possibly be d/c home sson   Active Problems:   Sepsis, ulcerative colitis  - pt did not meet criteria for sepsis on admission but 12/22 T max 101.2 F  with HR > 90 and WBC up to 20 K - started on flagyl and cipro 12/22 - resolved     Hypokalemia - supplemented and WNL   Ulcerative colitis ([place]) - per GI team, appreciate assistance    Cancer of lower-inner quadrant of left female breast ([place]) - notified Dr. Burr Medico of pt's admission   Leukocytosis - unclear if related to acute illness and imposed on recent use of Prednisone  - resolved   DVT prophylaxis - SCD's  Code Status: Full.  Family Communication:  plan of care discussed with the patient and her sister at bedside  Disposition Plan: Home when cleared by GI team and off Solumedrol IV  IV access:  Peripheral IV  Procedures and diagnostic studies:    Ct Abdomen Pelvis Wo Contrast 04/18/2015 Scattered wall thickening of the colon from transverse colon through rectum, greatest at sigmoid colon and rectum, with associated infiltrative changes of mesocolon consistent with patient history of ulcerative colitis. Unable to exclude perirectal/perianal inflammatory process posteriorly ; recommend correlation with physical exam and proctoscopy to exclude fistula and abscess. No evidence of perforation or abscess. Question tiny hiatal hernia. Minimal pericardial effusion.   Dg Chest Port 1 View 04/19/2015  Negative for pneumonia.   Medical Consultants:  GI  Other Consultants:  None  IAnti-Infectives:   Flagyl 12/22 --> Cipro 12/22 -->  [name], MD  [facility] Pager [phone]  If 7PM-7AM, please contact night-coverage [email or web address] Password [email or web address] 04/26/2015, 11:59 AM   LOS: 7 days   HPI/Subjective: No events overnight. Persistent  diarrhea overnight but less frequent.   Objective: Filed Vitals:   04/25/15 0601 04/25/15 1430 04/25/15 2033 04/26/15 0607  BP: 127/67 122/68 135/67 121/74  Pulse: 69 70 80 84  Temp: 97.9 F (36.6 C) 98.2 F (36.8 C) 99.7 F (37.6 C) 98.8 F (37.1 C)  TempSrc: Oral Oral Oral Oral  Resp: 18 20 18 16   Height:      Weight:       SpO2: 98% 98% 100% 98%    Intake/Output Summary (Last 24 hours) at 04/26/15 1159 Last data filed at 04/26/15 0700  Gross per 24 hour  Intake 1893.34 ml  Output    900 ml  Net 993.34 ml    Exam:   General:  Pt is alert, follows commands appropriately, not in acute distress  Cardiovascular: Regular rate and rhythm, S1/S2, no murmurs, no rubs, no gallops  Respiratory: Clear to auscultation bilaterally, no wheezing, no crackles, no rhonchi  Abdomen: Soft, tender in lower abd quadrants, non distended, bowel sounds present, no guarding   Data Reviewed: Basic Metabolic Panel:  Recent Labs Lab 04/21/15 0436 04/22/15 0420 04/23/15 0422 04/25/15 0410 04/26/15 0405  NA 136 138 138 140 137  K 3.5 3.5 3.7 3.2* 3.5  CL 107 104 106 108 102  CO2 21* 25 25 26 25   GLUCOSE 100* 96 111* 87 99  BUN 7 5* 5* 5* <5*  CREATININE 0.48 0.59 0.64 0.53 0.50  CALCIUM 8.2* 8.2* 8.0* 8.1* 8.2*   CBC:  Recent Labs Lab 04/21/15 0436 04/22/15 0420 04/23/15 0422 04/25/15 0410 04/26/15 0405  WBC 9.7 11.3* 8.3 6.2 9.2  HGB 10.8* 11.7* 10.7* 10.7* 12.2  HCT 32.4* 35.6* 32.7* 31.6* 36.4  MCV 92.3 92.0 91.9 91.9 91.5  PLT 233 246 245 268 332    Recent Results (from the past 240 hour(s))  C difficile quick scan w PCR reflex     Status: Abnormal   Collection Time: 04/18/15  4:38 PM  Result Value Ref Range Status   C Diff antigen INDETERMINATE (A) NEGATIVE Final   C Diff toxin INDETERMINATE (A) NEGATIVE Final   C Diff interpretation Results are indeterminate. See PCR results.  Final  Clostridium Difficile by PCR     Status: None   Collection Time: 04/18/15  4:38 PM  Result Value Ref Range Status   Toxigenic C Difficile by pcr NEGATIVE NEGATIVE Final  Culture, blood (Routine X 2) w Reflex to ID Panel     Status: None   Collection Time: 04/19/15 12:18 AM  Result Value Ref Range Status   Specimen Description BLOOD RIGHT HAND  Final   Special Requests BOTTLES DRAWN AEROBIC AND ANAEROBIC  5CC  Final   Culture   Final    NO GROWTH 5 DAYS Performed at [facility]    Report Status 04/24/2015 FINAL  Final  Culture, blood (Routine X 2) w Reflex to ID Panel     Status: None   Collection Time: 04/19/15 12:25 AM  Result Value Ref Range Status   Specimen Description BLOOD RIGHT ARM  Final   Special Requests BOTTLES DRAWN AEROBIC AND ANAEROBIC 5CC  Final   Culture   Final    NO GROWTH 5 DAYS Performed at [facility]    Report Status 04/24/2015 FINAL  Final  Gastrointestinal Panel by PCR , Stool     Status: None   Collection Time: 04/19/15  5:46 AM  Result Value Ref Range Status   Campylobacter species NOT DETECTED NOT  DETECTED Final   Plesimonas shigelloides NOT DETECTED NOT DETECTED Final   Salmonella species NOT DETECTED NOT DETECTED Final   Yersinia enterocolitica NOT DETECTED NOT DETECTED Final   Vibrio species NOT DETECTED NOT DETECTED Final   Vibrio cholerae NOT DETECTED NOT DETECTED Final   Enteroaggregative E coli (EAEC) NOT DETECTED NOT DETECTED Final   Enteropathogenic E coli (EPEC) NOT DETECTED NOT DETECTED Final   Enterotoxigenic E coli (ETEC) NOT DETECTED NOT DETECTED Final   Shiga like toxin producing E coli (STEC) NOT DETECTED NOT DETECTED Final   E. coli O157 NOT DETECTED NOT DETECTED Final   Shigella/Enteroinvasive E coli (EIEC) NOT DETECTED NOT DETECTED Final   Cryptosporidium NOT DETECTED NOT DETECTED Final   Cyclospora cayetanensis NOT DETECTED NOT DETECTED Final   Entamoeba histolytica NOT DETECTED NOT DETECTED Final   Giardia lamblia NOT DETECTED NOT DETECTED Final   Adenovirus F40/41 NOT DETECTED NOT DETECTED Final   Astrovirus NOT DETECTED NOT DETECTED Final   Norovirus GI/GII NOT DETECTED NOT DETECTED Final   Rotavirus A NOT DETECTED NOT DETECTED Final   Sapovirus (I, II, IV, and V) NOT DETECTED NOT DETECTED Final  C difficile quick scan w PCR reflex     Status: None   Collection Time: 04/19/15 11:20 AM  Result Value Ref Range  Status   C Diff antigen NEGATIVE NEGATIVE Final   C Diff toxin NEGATIVE NEGATIVE Final   C Diff interpretation Negative for toxigenic C. difficile  Final     Scheduled Meds: . ciprofloxacin  500 mg Oral BID  . dicyclomine  10 mg Oral TID AC  . enoxaparin (LOVENOX) injection  40 mg Subcutaneous Q24H  . FLUoxetine  20 mg Oral Daily  . hydrocortisone  25 mg Rectal Daily  . mesalamine  1,000 mg Rectal BID  . metroNIDAZOLE  500 mg Oral Q12H  . predniSONE  40 mg Oral Q breakfast   Continuous Infusions: . 0.45 % NaCl with KCl 20 mEq / L 50 mL/hr at 04/25/15 1156

## 2015-04-26 NOTE — Progress Notes (Signed)
Patient ID: Sara Barnes, female   DOB: Sep 25, 1940, 74 y.o.   MRN: [id number]    Progress Note   Subjective  Feeling better- bentyl helping- says having small squirts of stool not much volume- still some blood. She is eating, up walking yesterday   Objective   Vital signs in last 24 hours: Temp:  [98.2 F (36.8 C)-99.7 F (37.6 C)] 98.8 F (37.1 C) (12/29 0607) Pulse Rate:  [70-84] 84 (12/29 0607) Resp:  [16-20] 16 (12/29 0607) BP: (121-135)/(67-74) 121/74 mmHg (12/29 0607) SpO2:  [98 %-100 %] 98 % (12/29 0607) Last BM Date: 04/25/15 General:    white female in NAD -HOH Heart:  Regular rate and rhythm; no murmurs Lungs: Respirations even and unlabored, lungs CTA bilaterally Abdomen:  Soft,  Basically nontender and nondistended. Normal bowel sounds. Extremities:  Without edema. Neurologic:  Alert and oriented,  grossly normal neurologically. Psych:  Cooperative. Normal mood and affect.  Intake/Output from previous day: 12/28 0701 - 12/29 0700 In: 2133.3 [P.O.:480; I.V.:953.3; IV Piggyback:700] Out: 900 [Urine:900] Intake/Output this shift:    Lab Results:  Recent Labs  04/25/15 0410 04/26/15 0405  WBC 6.2 9.2  HGB 10.7* 12.2  HCT 31.6* 36.4  PLT 268 332   BMET  Recent Labs  04/25/15 0410 04/26/15 0405  NA 140 137  K 3.2* 3.5  CL 108 102  CO2 26 25  GLUCOSE 87 99  BUN 5* <5*  CREATININE 0.53 0.50  CALCIUM 8.1* 8.2*   LFT No results for input(s): PROT, ALBUMIN, AST, ALT, ALKPHOS, BILITOT, BILIDIR, IBILI in the last 72 hours. PT/INR No results for input(s): LABPROT, INR in the last 72 hours.  Studies/Results: Ct Pelvis W Contrast  04/24/2015  CLINICAL DATA:  Ulcerative colitis, perirectal abscess. EXAM: CT PELVIS WITH CONTRAST TECHNIQUE: Multidetector CT imaging of the pelvis was performed using the standard protocol following the bolus administration of intravenous contrast. CONTRAST:  57m OMNIPAQUE IOHEXOL 300 MG/ML  SOLN COMPARISON:  04/19/2015.  FINDINGS: There is thickening of the visualized portions of the descending and majority of the rectosigmoid colon. There is minimal stranding adjacent to the rectum/anus, without a discrete collection of fluid and/or air to indicate a residual abscess. Visualized portions of the cecum and ascending colon are within normal limits, as is visualized small bowel. Vascular structures are unremarkable. Hysterectomy. Ovaries are visualized. No free fluid. No pathologically enlarged lymph nodes. No worrisome lytic or sclerotic lesions. Degenerative changes are seen in the spine. IMPRESSION: Persistent thickening of the descending and rectosigmoid colon. Resolved perirectal/perianal abscess. Electronically Signed   By: MLorin PicketM.D.   On: 04/24/2015 15:52       Assessment / Plan:    #1 74yo female with severe  Colitis with recurrent perianal abscesses more consistent with  Crohns colitis at this point  Abscess resolved- day #7 IV ABX- will convert to po-treat total of 14 days Had increased Solumedrol to 40 mg yesterday pm- continue Await approval of Entyvio  -hope to start outpt infusions very soon- Quantiferon still pending Pt still using IV pain meds- encouraged to wean off, use tylenol Ambulate etc Hopeful she can be discharged in next couple days      Principal Problem:   Rectal bleeding Active Problems:   Cancer of lower-inner quadrant of left female breast ([place]   [place]cerative colitis ([place]   Sepsis ([place]   [place]ative [place]itis with [place] bleeding ([place]   Fever   Lower GI bleed   Rectal pain  Leucocytosis   Ulcerative pancolitis with abscess ([place])   Dehydration   Peri-rectal abscess   Abscess of anal or rectal region     LOS: 7 days   Sara Barnes  04/26/2015, 8:51 AM

## 2015-04-27 LAB — CBC
HCT: 34.1 % — ABNORMAL LOW (ref 36.0–46.0)
Hemoglobin: 11.2 g/dL — ABNORMAL LOW (ref 12.0–15.0)
MCH: 30.9 pg (ref 26.0–34.0)
MCHC: 32.8 g/dL (ref 30.0–36.0)
MCV: 93.9 fL (ref 78.0–100.0)
PLATELETS: 335 10*3/uL (ref 150–400)
RBC: 3.63 MIL/uL — ABNORMAL LOW (ref 3.87–5.11)
RDW: 12.8 % (ref 11.5–15.5)
WBC: 9.8 10*3/uL (ref 4.0–10.5)

## 2015-04-27 LAB — BASIC METABOLIC PANEL
ANION GAP: 8 (ref 5–15)
BUN: 8 mg/dL (ref 6–20)
CALCIUM: 8.3 mg/dL — AB (ref 8.9–10.3)
CO2: 25 mmol/L (ref 22–32)
Chloride: 103 mmol/L (ref 101–111)
Creatinine, Ser: 0.51 mg/dL (ref 0.44–1.00)
Glucose, Bld: 120 mg/dL — ABNORMAL HIGH (ref 65–99)
Potassium: 3.6 mmol/L (ref 3.5–5.1)
Sodium: 136 mmol/L (ref 135–145)

## 2015-04-27 MED ORDER — MESALAMINE 1000 MG RE SUPP: 1000.0000 mg | Freq: Two times a day (BID) | RECTAL | Status: DC

## 2015-04-27 MED ORDER — ALPRAZOLAM 0.5 MG PO TABS: 0.5000 mg | ORAL_TABLET | Freq: Two times a day (BID) | ORAL | Status: DC | PRN

## 2015-04-27 MED ORDER — CIPROFLOXACIN HCL 500 MG PO TABS: 500.0000 mg | ORAL_TABLET | Freq: Two times a day (BID) | ORAL | Status: DC

## 2015-04-27 MED ORDER — HYDROCODONE-ACETAMINOPHEN 5-325 MG PO TABS: ORAL_TABLET | ORAL | Status: DC

## 2015-04-27 MED ORDER — DICYCLOMINE HCL 10 MG PO CAPS: 10.0000 mg | ORAL_CAPSULE | Freq: Three times a day (TID) | ORAL | Status: DC

## 2015-04-27 MED ORDER — PREDNISONE 10 MG PO TABS: ORAL_TABLET | ORAL | Status: DC

## 2015-04-27 MED ORDER — HYDROCORTISONE ACETATE 25 MG RE SUPP: 25.0000 mg | Freq: Every day | RECTAL | Status: DC

## 2015-04-27 MED ORDER — METRONIDAZOLE 500 MG PO TABS: 500.0000 mg | ORAL_TABLET | Freq: Three times a day (TID) | ORAL | Status: DC

## 2015-04-27 MED ORDER — ZOLPIDEM TARTRATE 5 MG PO TABS: 5.0000 mg | ORAL_TABLET | Freq: Every evening | ORAL | Status: DC | PRN

## 2015-04-27 NOTE — Progress Notes (Signed)
Discharge instructions reviewed with patient and sister Vaughan Basta.  Prescriptions given to [facility].  Questions answered, verbalized understanding.

## 2015-04-27 NOTE — Progress Notes (Signed)
Patient ID: Sara Barnes, female   DOB: 1940-08-18, 74 y.o.   MRN: [id number]     Progress Note   Subjective    Feeling better- , small volume stools and decreased frequency She feels she can manage at home   Objective   Vital signs in last 24 hours: Temp:  [97.8 F (36.6 C)-98.8 F (37.1 C)] 97.8 F (36.6 C) (12/30 0445) Pulse Rate:  [70-92] 77 (12/30 0445) Resp:  [16-18] 16 (12/30 0445) BP: (100-121)/(68-87) 121/75 mmHg (12/30 0445) SpO2:  [97 %-98 %] 98 % (12/30 0445) Last BM Date: 04/26/15 General:   older white female in NAD Heart:  Regular rate and rhythm; no murmurs Lungs: Respirations even and unlabored, lungs CTA bilaterally Abdomen:  Soft, nontender and nondistended. Normal bowel sounds. Extremities:  Without edema. Neurologic:  Alert and oriented,  grossly normal neurologically. Psych:  Cooperative. Normal mood and affect.  Intake/Output from previous day: 12/29 0701 - 12/30 0700 In: 600 [P.O.:600] Out: 300 [Urine:300] Intake/Output this shift:    Lab Results:  Recent Labs  04/25/15 0410 04/26/15 0405 04/27/15 0405  WBC 6.2 9.2 9.8  HGB 10.7* 12.2 11.2*  HCT 31.6* 36.4 34.1*  PLT 268 332 335   BMET  Recent Labs  04/25/15 0410 04/26/15 0405 04/27/15 0405  NA 140 137 136  K 3.2* 3.5 3.6  CL 108 102 103  CO2 26 25 25   GLUCOSE 87 99 120*  BUN 5* <5* 8  CREATININE 0.53 0.50 0.51  CALCIUM 8.1* 8.2* 8.3*   LFT No results for input(s): PROT, ALBUMIN, AST, ALT, ALKPHOS, BILITOT, BILIDIR, IBILI in the last 72 hours. PT/INR No results for input(s): LABPROT, INR in the last 72 hours.  Studies/Results: No results found.     Assessment / Plan:    #1  75  Yo female with severe colitis - likely  Crohns colitis with recurrent perirectal abscesses admitted  8 days ago with small abscess and exacerbation of colitis  She is much improved, and abscess has resolved per CT   #2 anemia - stable  Plan; Ok for discharge home today Will increase  Flagyl to 500 mg TID, and continue Cipro and flagyl  po another 10 days Continue prednisone 40 mg po QAM x 2 weeks , then decrease by 5 mg per week until tapered off  Pt asks for short term rx for Ambien  73m which she has required in the past with steroids Continue bentyl 10 TID ac  Oral analgesic as needed We are working on approval for [employer]- and hope   can start induction  in next week or so- office will contact her next week. 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

## 2015-04-27 NOTE — Discharge Summary (Signed)
Physician Discharge Summary  Sara Barnes NWG:[id number] DOB: 1940-11-18 DOA: 04/18/2015  PCP: [name], DO  Admit date: 04/18/2015 Discharge date: 04/27/2015  Recommendations for Outpatient Follow-up:  1. Pt will need to follow up with PCP in 2-3 weeks post discharge 2. Please obtain BMP to evaluate electrolytes and kidney function  Discharge Diagnoses:  Principal Problem:   Rectal bleeding Active Problems:   Ulcerative colitis ([place])   Sepsis (HCC)   Ulcerative proctitis with rectal bleeding (HCC)   Ulcerative pancolitis with abscess (HCC)   Abscess of anal or rectal region   Lower GI bleed   Rectal pain   Leucocytosis   Dehydration   Peri-rectal abscess  Discharge Condition: Stable  Diet recommendation: Heart healthy diet discussed in details    Brief narrative:    74 y.o. female with known ulcerative colitis, breast cancer in remission, no chemotherapy (this was a surgical excision with radiation several months ago, was on Femara per Dr. Burr Medico)), presented to [facility] ED with main concern of more than two weeks duration of progressively worsening diarrhea initially with bright read blood in the stool but has become progressively darker in the past several days. Pt reports over 12 episodes per day. This has been associated with intermittent tenesmus, rectal pain that is sharp and 10/10 in severity when present, occasionally but not consistently radiating to lower abd quadrants and with no specific alleviating factors. Pt denies fevers chills, no chest pain or shortness of breath.   In ED, pt noted to be hemodynamically stable, VSS. TRH asked to admit for further evaluation.  Hospital course - complicated by persistent diarrheas, requiring IV solumedrol. Pt transitioned to oral Prednisone and oral ABX Cipro and Flagyl 12/29 per GI team and we are currently monitoring how she does on this oral regimen. As of today 12/30, pt still with about 3-4 watery diarrheas and we are  continuing to follow GI team recommendations. Pt can be discharged home once GI team clears for discharge.   Assessment/Plan:    Principal Problem:  Rectal bleeding with N/V/D - related to UC flare, appreciate GI team following  - C. Diff negative, stool studies negative - N/V resolved, diarrhea improving and currently 3-4 episodes of diarrhea per night  - pt has history of pelvic abscess related to UC in 2015 and on admission had small peri-anal abscess - per surgery, no intervention needed at this time, repeat CT pelvis 12/27 noted resolved perirectal/perianal abscess - per GI team, transitioned to PO regimen including Prednisone and ABX 12/29  - so far tolerating diet well as well as PO ABX< wants to go home   Active Problems:  Sepsis, ulcerative colitis  - pt did not meet criteria for sepsis on admission but 12/22 T max 101.2 F with HR > 90 and WBC up to 20 K - started on flagyl and cipro 12/22 - sepsis etiology now resolved    Hypokalemia - supplemented and WNL   Cancer of lower-inner quadrant of left female breast ([place]) - notified Dr. Burr Medico of pt's admission   Leukocytosis - secondary to acute illness and imposed on recent use of Prednisone  - resolved   DVT prophylaxis - SCD's  Code Status: Full.  Family Communication: plan of care discussed with the patient and her sister at bedside  Disposition Plan: Home   IV access:  Peripheral IV  Procedures and diagnostic studies:   Ct Abdomen Pelvis Wo Contrast 04/18/2015 Scattered wall thickening of the colon from transverse colon  through rectum, greatest at sigmoid colon and rectum, with associated infiltrative changes of mesocolon consistent with patient history of ulcerative colitis. Unable to exclude perirectal/perianal inflammatory process posteriorly ; recommend correlation with physical exam and proctoscopy to exclude fistula and abscess. No evidence of perforation or abscess. Question tiny hiatal  hernia. Minimal pericardial effusion.   Dg Chest Port 1 View 04/19/2015 Negative for pneumonia.   Ct Pelvis W Contrast 04/24/2015 Persistent thickening of the descending and rectosigmoid colon. Resolved perirectal/perianal abscess.   Medical Consultants:  GI  Other Consultants:  None  IAnti-Infectives:   Flagyl 12/22 --> Cipro 12/22 -->        Discharge Exam: Filed Vitals:   04/26/15 2131 04/27/15 0445  BP: 118/68 121/75  Pulse: 70 77  Temp: 98.8 F (37.1 C) 97.8 F (36.6 C)  Resp: 16 16   Filed Vitals:   04/26/15 0607 04/26/15 1400 04/26/15 2131 04/27/15 0445  BP: 121/74 100/87 118/68 121/75  Pulse: 84 92 70 77  Temp: 98.8 F (37.1 C) 98 F (36.7 C) 98.8 F (37.1 C) 97.8 F (36.6 C)  TempSrc: Oral Oral Oral Oral  Resp: 16 18 16 16   Height:      Weight:      SpO2: 98% 97% 98% 98%    General: Pt is alert, follows commands appropriately, not in acute distress Cardiovascular: Regular rate and rhythm, S1/S2 +, no murmurs, no rubs, no gallops Respiratory: Clear to auscultation bilaterally, no wheezing, no crackles, no rhonchi Abdominal: Soft, non tender, non distended, bowel sounds +, no guarding   Discharge Instructions  Discharge Instructions    Diet - low sodium heart healthy    Complete by:  As directed      Increase activity slowly    Complete by:  As directed             Medication List    STOP taking these medications        ibuprofen 200 MG tablet  Commonly known as:  ADVIL,MOTRIN      TAKE these medications        acetaminophen 325 MG tablet  Commonly known as:  TYLENOL  Take 650 mg by mouth every 6 (six) hours as needed for moderate pain.     ALPRAZolam 0.5 MG tablet  Commonly known as:  XANAX  Take 1 tablet (0.5 mg total) by mouth 2 (two) times daily as needed for anxiety.     ciprofloxacin 500 MG tablet  Commonly known as:  CIPRO  Take 1 tablet (500 mg total) by mouth 2 (two) times daily.     cyanocobalamin 1000  MCG/ML injection  Commonly known as:  (VITAMIN B-12)  INJECT 1 ML (1,000 MCG TOTAL) INTO THE MUSCLE EVERY 30 (THIRTY) DAYS.     dicyclomine 10 MG capsule  Commonly known as:  BENTYL  Take 1 capsule (10 mg total) by mouth 3 (three) times daily before meals.     famotidine 20 MG tablet  Commonly known as:  PEPCID  Take 20 mg by mouth 2 (two) times daily as needed for heartburn. Reported on 04/11/2015     FLUoxetine 20 MG capsule  Commonly known as:  PROZAC  TAKE ONE CAPSULE BY MOUTH ONCE DAILY     HYDROcodone-acetaminophen 5-325 MG tablet  Commonly known as:  NORCO/VICODIN  Take 1-2 tablets by mouth every 6 hours as needed for pain and/or cough.     hydrocortisone 25 MG suppository  Commonly known as:  ANUSOL-HC  Place  1 suppository (25 mg total) rectally daily.     letrozole 2.5 MG tablet  Commonly known as:  FEMARA  Take 1 tablet (2.5 mg total) by mouth daily.     mesalamine 1000 MG suppository  Commonly known as:  CANASA  Place 1 suppository (1,000 mg total) rectally 2 (two) times daily.     metroNIDAZOLE 500 MG tablet  Commonly known as:  FLAGYL  Take 1 tablet (500 mg total) by mouth 3 (three) times daily.     predniSONE 10 MG tablet  Commonly known as:  DELTASONE  Continue taking 40 mg tablet daily at breakfast for 2 weeks and after that continue to taper down by 5 mg tablet weekly until completed     zolpidem 5 MG tablet  Commonly known as:  AMBIEN  Take 1 tablet (5 mg total) by mouth at bedtime as needed for sleep.            Follow-up Information    Follow up with Jerene Bears, MD On 06/20/2015.   Specialty:  Gastroenterology   Why:  10:00am   Contact information:   [address] [place] [address] [phone]       Follow up with [name], DO.   Specialty:  Family Medicine   Contact information:   [address] [place] [address] [phone]        The results of significant diagnostics from this  hospitalization (including imaging, microbiology, ancillary and laboratory) are listed below for reference.     Microbiology: Recent Results (from the past 240 hour(s))  C difficile quick scan w PCR reflex     Status: Abnormal   Collection Time: 04/18/15  4:38 PM  Result Value Ref Range Status   C Diff antigen INDETERMINATE (A) NEGATIVE Final   C Diff toxin INDETERMINATE (A) NEGATIVE Final   C Diff interpretation Results are indeterminate. See PCR results.  Final  Clostridium Difficile by PCR     Status: None   Collection Time: 04/18/15  4:38 PM  Result Value Ref Range Status   Toxigenic C Difficile by pcr NEGATIVE NEGATIVE Final  Culture, blood (Routine X 2) w Reflex to ID Panel     Status: None   Collection Time: 04/19/15 12:18 AM  Result Value Ref Range Status   Specimen Description BLOOD RIGHT HAND  Final   Special Requests BOTTLES DRAWN AEROBIC AND ANAEROBIC 5CC  Final   Culture   Final    NO GROWTH 5 DAYS Performed at [name]    Report Status 04/24/2015 FINAL  Final  Culture, blood (Routine X 2) w Reflex to ID Panel     Status: None   Collection Time: 04/19/15 12:25 AM  Result Value Ref Range Status   Specimen Description BLOOD RIGHT ARM  Final   Special Requests BOTTLES DRAWN AEROBIC AND ANAEROBIC 5CC  Final   Culture   Final    NO GROWTH 5 DAYS Performed at [facility]    Report Status 04/24/2015 FINAL  Final  Gastrointestinal Panel by PCR , Stool     Status: None   Collection Time: 04/19/15  5:46 AM  Result Value Ref Range Status   Campylobacter species NOT DETECTED NOT DETECTED Final   Plesimonas shigelloides NOT DETECTED NOT DETECTED Final   Salmonella species NOT DETECTED NOT DETECTED Final   Yersinia enterocolitica NOT DETECTED NOT DETECTED Final   Vibrio species NOT DETECTED NOT DETECTED Final  Vibrio cholerae NOT DETECTED NOT DETECTED Final   Enteroaggregative E coli (EAEC) NOT DETECTED NOT DETECTED Final   Enteropathogenic E coli  (EPEC) NOT DETECTED NOT DETECTED Final   Enterotoxigenic E coli (ETEC) NOT DETECTED NOT DETECTED Final   Shiga like toxin producing E coli (STEC) NOT DETECTED NOT DETECTED Final   E. coli O157 NOT DETECTED NOT DETECTED Final   Shigella/Enteroinvasive E coli (EIEC) NOT DETECTED NOT DETECTED Final   Cryptosporidium NOT DETECTED NOT DETECTED Final   Cyclospora cayetanensis NOT DETECTED NOT DETECTED Final   Entamoeba histolytica NOT DETECTED NOT DETECTED Final   Giardia lamblia NOT DETECTED NOT DETECTED Final   Adenovirus F40/41 NOT DETECTED NOT DETECTED Final   Astrovirus NOT DETECTED NOT DETECTED Final   Norovirus GI/GII NOT DETECTED NOT DETECTED Final   Rotavirus A NOT DETECTED NOT DETECTED Final   Sapovirus (I, II, IV, and V) NOT DETECTED NOT DETECTED Final  C difficile quick scan w PCR reflex     Status: None   Collection Time: 04/19/15 11:20 AM  Result Value Ref Range Status   C Diff antigen NEGATIVE NEGATIVE Final   C Diff toxin NEGATIVE NEGATIVE Final   C Diff interpretation Negative for toxigenic C. difficile  Final     Labs: Basic Metabolic Panel:  Recent Labs Lab 04/22/15 0420 04/23/15 0422 04/25/15 0410 04/26/15 0405 04/27/15 0405  NA 138 138 140 137 136  K 3.5 3.7 3.2* 3.5 3.6  CL 104 106 108 102 103  CO2 25 25 26 25 25   GLUCOSE 96 111* 87 99 120*  BUN 5* 5* 5* <5* 8  CREATININE 0.59 0.64 0.53 0.50 0.51  CALCIUM 8.2* 8.0* 8.1* 8.2* 8.3*   CBC:  Recent Labs Lab 04/22/15 0420 04/23/15 0422 04/25/15 0410 04/26/15 0405 04/27/15 0405  WBC 11.3* 8.3 6.2 9.2 9.8  HGB 11.7* 10.7* 10.7* 12.2 11.2*  HCT 35.6* 32.7* 31.6* 36.4 34.1*  MCV 92.0 91.9 91.9 91.5 93.9  PLT 246 245 268 332 335     SIGNED: Time coordinating discharge: 30 minutes  MAGICK-Muscab Brenneman, MD  [facility] 04/27/2015, 10:50 AM Pager [phone]  If 7PM-7AM, please contact night-coverage [email or web address] Password [email or web address]

## 2015-04-27 NOTE — Progress Notes (Signed)
Patient ID: Sara Barnes, female   DOB: 06-Dec-1940, 74 y.o.   MRN: [id number]  [facility] PROGRESS NOTE  Sara Barnes YOV:[id number] DOB: Apr 24, 1941 DOA: 04/18/2015 PCP: [name], DO   Brief narrative:    74 y.o. female with known ulcerative colitis, breast cancer in remission, no chemotherapy (this was a surgical excision with radiation several months ago, was on Femara per Dr. Burr Medico)), presented to [facility] ED with main concern of more than two weeks duration of progressively worsening diarrhea initially with bright read blood in the stool but has become progressively darker in the past several days. Pt reports over 12 episodes per day. This has been associated with intermittent tenesmus, rectal pain that is sharp and 10/10 in severity when present, occasionally but not consistently radiating to lower abd quadrants and with no specific alleviating factors. Pt denies fevers chills, no chest pain or shortness of breath.   In ED, pt noted to be hemodynamically stable, VSS. TRH asked to admit for further evaluation.  Hospital course - complicated by persistent diarrheas, requiring IV solumedrol. Pt transitioned to oral Prednisone and oral ABX Cipro and Flagyl 12/29 per GI team and we are currently monitoring how she does on this oral regimen. As of today 12/30, pt still with about 3-4 watery diarrheas and we are continuing to follow GI team recommendations. Pt can be discharged home once GI team clears for discharge.   Assessment/Plan:    Principal Problem:  Rectal bleeding with N/V/D - related to UC flare, appreciate GI team following  - C. Diff negative, stool studies negative - N/V resolved, diarrhea improving and currently 3-4 episodes of diarrhea per night  - pt has history of pelvic abscess related to UC in 2015 and on admission had small peri-anal abscess - per surgery, no intervention needed at this time, repeat CT pelvis 12/27 noted resolved perirectal/perianal abscess - per GI  team, transitioned to PO regimen including Prednisone and ABX 12/29  - so far tolerating diet well   Active Problems:   Sepsis, ulcerative colitis  - pt did not meet criteria for sepsis on admission but 12/22 T max 101.2 F with HR > 90 and WBC up to 20 K - started on flagyl and cipro 12/22 - sepsis etiology now resolved     Hypokalemia - supplemented and WNL - repeat BMP in AM   Cancer of lower-inner quadrant of left female breast (Sara Barnes) - notified Dr. Burr Medico of pt's admission   Leukocytosis - secondary to acute illness and imposed on recent use of Prednisone  - resolved   DVT prophylaxis - SCD's  Code Status: Full.  Family Communication:  plan of care discussed with the patient and her sister at bedside  Disposition Plan: Home when cleared by GI team   IV access:  Peripheral IV  Procedures and diagnostic studies:    Ct Abdomen Pelvis Wo Contrast 04/18/2015 Scattered wall thickening of the colon from transverse colon through rectum, greatest at sigmoid colon and rectum, with associated infiltrative changes of mesocolon consistent with patient history of ulcerative colitis. Unable to exclude perirectal/perianal inflammatory process posteriorly ; recommend correlation with physical exam and proctoscopy to exclude fistula and abscess. No evidence of perforation or abscess. Question tiny hiatal hernia. Minimal pericardial effusion.   Dg Chest Port 1 View 04/19/2015  Negative for pneumonia.   Ct Pelvis W Contrast 04/24/2015  Persistent thickening of the descending and rectosigmoid colon. Resolved perirectal/perianal abscess.   Medical Consultants:  GI  Other  Consultants:  None  IAnti-Infectives:   Flagyl 12/22 --> Cipro 12/22 -->  [name], MD  [facility] Pager [phone]  If 7PM-7AM, please contact night-coverage [email or web address] Password [email or web address] 04/27/2015, 9:04 AM   LOS: 8 days   HPI/Subjective: No events overnight. Persistent diarrhea overnight but less  frequent.   Objective: Filed Vitals:   04/26/15 0607 04/26/15 1400 04/26/15 2131 04/27/15 0445  BP: 121/74 100/87 118/68 121/75  Pulse: 84 92 70 77  Temp: 98.8 F (37.1 C) 98 F (36.7 C) 98.8 F (37.1 C) 97.8 F (36.6 C)  TempSrc: Oral Oral Oral Oral  Resp: 16 18 16 16   Height:      Weight:      SpO2: 98% 97% 98% 98%    Intake/Output Summary (Last 24 hours) at 04/27/15 0904 Last data filed at 04/27/15 0326  Gross per 24 hour  Intake    600 ml  Output    300 ml  Net    300 ml    Exam:   General:  Pt is alert, follows commands appropriately, not in acute distress  Cardiovascular: Regular rate and rhythm, S1/S2, no murmurs, no rubs, no gallops  Respiratory: Clear to auscultation bilaterally, no wheezing, no crackles, no rhonchi  Abdomen: Soft, tender in lower abd quadrants, non distended, bowel sounds present, no guarding   Data Reviewed: Basic Metabolic Panel:  Recent Labs Lab 04/22/15 0420 04/23/15 0422 04/25/15 0410 04/26/15 0405 04/27/15 0405  NA 138 138 140 137 136  K 3.5 3.7 3.2* 3.5 3.6  CL 104 106 108 102 103  CO2 25 25 26 25 25   GLUCOSE 96 111* 87 99 120*  BUN 5* 5* 5* <5* 8  CREATININE 0.59 0.64 0.53 0.50 0.51  CALCIUM 8.2* 8.0* 8.1* 8.2* 8.3*   CBC:  Recent Labs Lab 04/22/15 0420 04/23/15 0422 04/25/15 0410 04/26/15 0405 04/27/15 0405  WBC 11.3* 8.3 6.2 9.2 9.8  HGB 11.7* 10.7* 10.7* 12.2 11.2*  HCT 35.6* 32.7* 31.6* 36.4 34.1*  MCV 92.0 91.9 91.9 91.5 93.9  PLT 246 245 268 332 335    Recent Results (from the past 240 hour(s))  C difficile quick scan w PCR reflex     Status: Abnormal   Collection Time: 04/18/15  4:38 PM  Result Value Ref Range Status   C Diff antigen INDETERMINATE (A) NEGATIVE Final   C Diff toxin INDETERMINATE (A) NEGATIVE Final   C Diff interpretation Results are indeterminate. See PCR results.  Final  Clostridium Difficile by PCR     Status: None   Collection Time: 04/18/15  4:38 PM  Result Value Ref Range  Status   Toxigenic C Difficile by pcr NEGATIVE NEGATIVE Final  Culture, blood (Routine X 2) w Reflex to ID Panel     Status: None   Collection Time: 04/19/15 12:18 AM  Result Value Ref Range Status   Specimen Description BLOOD RIGHT HAND  Final   Special Requests BOTTLES DRAWN AEROBIC AND ANAEROBIC 5CC  Final   Culture   Final    NO GROWTH 5 DAYS Performed at [facility]    Report Status 04/24/2015 FINAL  Final  Culture, blood (Routine X 2) w Reflex to ID Panel     Status: None   Collection Time: 04/19/15 12:25 AM  Result Value Ref Range Status   Specimen Description BLOOD RIGHT ARM  Final   Special Requests BOTTLES DRAWN AEROBIC AND ANAEROBIC 5CC  Final   Culture   Final    NO  GROWTH 5 DAYS Performed at [facility]    Report Status 04/24/2015 FINAL  Final  Gastrointestinal Panel by PCR , Stool     Status: None   Collection Time: 04/19/15  5:46 AM  Result Value Ref Range Status   Campylobacter species NOT DETECTED NOT DETECTED Final   Plesimonas shigelloides NOT DETECTED NOT DETECTED Final   Salmonella species NOT DETECTED NOT DETECTED Final   Yersinia enterocolitica NOT DETECTED NOT DETECTED Final   Vibrio species NOT DETECTED NOT DETECTED Final   Vibrio cholerae NOT DETECTED NOT DETECTED Final   Enteroaggregative E coli (EAEC) NOT DETECTED NOT DETECTED Final   Enteropathogenic E coli (EPEC) NOT DETECTED NOT DETECTED Final   Enterotoxigenic E coli (ETEC) NOT DETECTED NOT DETECTED Final   Shiga like toxin producing E coli (STEC) NOT DETECTED NOT DETECTED Final   E. coli O157 NOT DETECTED NOT DETECTED Final   Shigella/Enteroinvasive E coli (EIEC) NOT DETECTED NOT DETECTED Final   Cryptosporidium NOT DETECTED NOT DETECTED Final   Cyclospora cayetanensis NOT DETECTED NOT DETECTED Final   Entamoeba histolytica NOT DETECTED NOT DETECTED Final   Giardia lamblia NOT DETECTED NOT DETECTED Final   Adenovirus F40/41 NOT DETECTED NOT DETECTED Final   Astrovirus NOT  DETECTED NOT DETECTED Final   Norovirus GI/GII NOT DETECTED NOT DETECTED Final   Rotavirus A NOT DETECTED NOT DETECTED Final   Sapovirus (I, II, IV, and V) NOT DETECTED NOT DETECTED Final  C difficile quick scan w PCR reflex     Status: None   Collection Time: 04/19/15 11:20 AM  Result Value Ref Range Status   C Diff antigen NEGATIVE NEGATIVE Final   C Diff toxin NEGATIVE NEGATIVE Final   C Diff interpretation Negative for toxigenic C. difficile  Final     Scheduled Meds: . ciprofloxacin  500 mg Oral BID  . dicyclomine  10 mg Oral TID AC  . enoxaparin (LOVENOX) injection  40 mg Subcutaneous Q24H  . FLUoxetine  20 mg Oral Daily  . hydrocortisone  25 mg Rectal Daily  . mesalamine  1,000 mg Rectal BID  . metroNIDAZOLE  500 mg Oral Q12H  . predniSONE  40 mg Oral Q breakfast   Continuous Infusions: . 0.45 % NaCl with KCl 20 mEq / L 50 mL/hr at 04/26/15 1248

## 2015-05-01 ENCOUNTER — Telehealth: Payer: Self-pay | Admitting: *Deleted

## 2015-05-01 NOTE — Telephone Encounter (Signed)
-----   Message from Alfredia Ferguson, PA-C sent at 04/27/2015 10:40 AM EST ----- Regarding: entyvio and follow up  This pt is being discharged from [facility] today- severe colitis and had a perirectal abscess-  She is going to be [employer] patient- some confusion about that earlier in week regarding nandigam or armbruster... Make her an office follow up  First available, and also follow up on approval for [facility] which was initiated by [facility] a few days ago- need to get her set up for infusion ASAP.Marland Kitchen thanks

## 2015-05-01 NOTE — Telephone Encounter (Signed)
Once she is approved for Entyvio I would like to infuse her as soon as possible. She doesn't need to see Amy prior to that, however maybe in the next few weeks she should be seen in the office. She should be on prednisone 55m for the next 2 weeks and then taper by 568mper week until she is done with it. Please let me know if you have any questions. Thanks

## 2015-05-01 NOTE — Telephone Encounter (Signed)
Spoke with [employer] and they hope to have benefits investigation information tomorrow. Dr. Havery Moros, First available OV for you is 06/26/15. Does this patient need to see Nicoletta Ba, PA prior to that?

## 2015-05-01 NOTE — Telephone Encounter (Signed)
Called patient with appointment.

## 2015-05-01 NOTE — Telephone Encounter (Signed)
Scheduled patient on 05/14/15 at 2:30 PM with Nicoletta Ba, PA . Letter mailed to patient.

## 2015-05-08 ENCOUNTER — Telehealth: Payer: Self-pay | Admitting: *Deleted

## 2015-05-08 DIAGNOSIS — C50312 Malignant neoplasm of lower-inner quadrant of left female breast: Secondary | ICD-10-CM | POA: Diagnosis not present

## 2015-05-08 DIAGNOSIS — K6389 Other specified diseases of intestine: Secondary | ICD-10-CM | POA: Diagnosis not present

## 2015-05-08 NOTE — Telephone Encounter (Signed)
Received the benefits investigation from [facility]. Patient's co insurance is 20 % until $5900 deductible is met then 100 % coverage. Left a message for patient to call back.

## 2015-05-08 NOTE — Telephone Encounter (Signed)
Spoke with [employer] and they state a benefit summary has been sent yesterday. She will refax this.

## 2015-05-09 ENCOUNTER — Other Ambulatory Visit: Payer: Self-pay | Admitting: *Deleted

## 2015-05-09 ENCOUNTER — Other Ambulatory Visit: Payer: Commercial Managed Care - HMO

## 2015-05-09 ENCOUNTER — Ambulatory Visit: Payer: Commercial Managed Care - HMO | Admitting: Hematology

## 2015-05-09 DIAGNOSIS — K50114 Crohn's disease of large intestine with abscess: Secondary | ICD-10-CM

## 2015-05-09 NOTE — Telephone Encounter (Signed)
Spoke with patient and she cannot afford Entyvio. She wouldl like to see if Remicade is less expensive. Dr. Havery Moros, what dose of Remicade would you start this patient with?(We will do a benefits comparsion)

## 2015-05-09 NOTE — Telephone Encounter (Signed)
Remicade would be dosed at 34m/kg. I would like to start her on either one as soon as possible following her hospitalization. I will call her to clarify and discuss the options once the pricing is done. Thanks

## 2015-05-11 NOTE — Telephone Encounter (Signed)
Spoke with Desma Mcgregor and patient needs to call [employer] re: out of pocket max and possible PAP. Patient aware.

## 2015-05-14 ENCOUNTER — Ambulatory Visit (INDEPENDENT_AMBULATORY_CARE_PROVIDER_SITE_OTHER)
Admission: RE | Admit: 2015-05-14 | Discharge: 2015-05-14 | Disposition: A | Discharge status: Home / Self Care | Payer: Commercial Managed Care - HMO | Source: Ambulatory Visit | Attending: Physician Assistant | Admitting: Physician Assistant

## 2015-05-14 ENCOUNTER — Encounter: Payer: Self-pay | Admitting: Physician Assistant

## 2015-05-14 ENCOUNTER — Other Ambulatory Visit (INDEPENDENT_AMBULATORY_CARE_PROVIDER_SITE_OTHER): Payer: Commercial Managed Care - HMO

## 2015-05-14 ENCOUNTER — Ambulatory Visit (INDEPENDENT_AMBULATORY_CARE_PROVIDER_SITE_OTHER): Payer: Commercial Managed Care - HMO | Admitting: Physician Assistant

## 2015-05-14 VITALS — BP 116/62 | HR 80 | Ht 65.75 in | Wt 152.2 lb

## 2015-05-14 DIAGNOSIS — K50119 Crohn's disease of large intestine with unspecified complications: Secondary | ICD-10-CM | POA: Diagnosis not present

## 2015-05-14 DIAGNOSIS — R0602 Shortness of breath: Secondary | ICD-10-CM

## 2015-05-14 DIAGNOSIS — K610 Anal abscess: Secondary | ICD-10-CM

## 2015-05-14 LAB — CBC WITH DIFFERENTIAL/PLATELET
BASOS ABS: 0.1 10*3/uL (ref 0.0–0.1)
Basophils Relative: 0.6 % (ref 0.0–3.0)
EOS ABS: 0 10*3/uL (ref 0.0–0.7)
EOS PCT: 0.2 % (ref 0.0–5.0)
HCT: 41.3 % (ref 36.0–46.0)
HEMOGLOBIN: 13.8 g/dL (ref 12.0–15.0)
LYMPHS ABS: 1.1 10*3/uL (ref 0.7–4.0)
Lymphocytes Relative: 9.8 % — ABNORMAL LOW (ref 12.0–46.0)
MCHC: 33.4 g/dL (ref 30.0–36.0)
MCV: 89.8 fl (ref 78.0–100.0)
MONO ABS: 0.2 10*3/uL (ref 0.1–1.0)
Monocytes Relative: 2 % — ABNORMAL LOW (ref 3.0–12.0)
Neutro Abs: 9.7 10*3/uL — ABNORMAL HIGH (ref 1.4–7.7)
Platelets: 302 10*3/uL (ref 150.0–400.0)
RBC: 4.61 Mil/uL (ref 3.87–5.11)
RDW: 13 % (ref 11.5–15.5)
WBC: 11.1 10*3/uL — AB (ref 4.0–10.5)

## 2015-05-14 LAB — BASIC METABOLIC PANEL
BUN: 16 mg/dL (ref 6–23)
CALCIUM: 8.9 mg/dL (ref 8.4–10.5)
CO2: 26 meq/L (ref 19–32)
Chloride: 98 mEq/L (ref 96–112)
Creatinine, Ser: 0.72 mg/dL (ref 0.40–1.20)
GFR: 84.27 mL/min (ref >60.00)
Glucose, Bld: 100 mg/dL — ABNORMAL HIGH (ref 70–99)
POTASSIUM: 4.5 meq/L (ref 3.5–5.1)
SODIUM: 134 meq/L — AB (ref 135–145)

## 2015-05-14 LAB — C-REACTIVE PROTEIN: CRP: 1.6 mg/dL (ref 0.5–20.0)

## 2015-05-14 MED ORDER — ZOLPIDEM TARTRATE 5 MG PO TABS: 5.0000 mg | ORAL_TABLET | Freq: Every evening | ORAL | Status: DC | PRN

## 2015-05-14 MED ORDER — PREDNISONE 10 MG PO TABS: ORAL_TABLET | ORAL | Status: DC

## 2015-05-14 NOTE — Patient Instructions (Signed)
Please go to the basement level to have your labs drawn and go to the radiology department also, basement level for chest x-ray. We sent a prescription to [facility] [address] for Prednisone 10 mg.  Prednisone 10g Take 25 mg ( 2 1/2 tab ) x 7 days Take 20 mg ( 2 tablets ) x 7 days Take 15 mg ( 1/1/2 Tablets x 7 days. Take 10 mg ( 1 tablet daily ) x 7 days Take 5 mg ( 1/2 tablet daily ) x 7 days We have faxed a prescription for Ambien to your pharmacy.

## 2015-05-14 NOTE — Progress Notes (Signed)
Patient ID: Sara Barnes, female   DOB: 1940/06/01, 75 y.o.   MRN: [id number]   Subjective:    Patient ID: Sara Barnes, female    DOB: 21-Mar-1941, 75 y.o.   MRN: [id number]  HPI Sara Barnes is a pleasant 75 year old white female former patient of Dr. [name] who was recently hospitalized and is now established with Dr. Havery Moros. Patient has history of universal ulcerative colitis, but had a perirectal abscess which required I&D early in 2016 and then patient was hospitalized in December 2016 with severe exacerbation of colitis and a recurrent small perianal abscess. His treated with IV antibiotics total of 7 days and abscess was followed up by CT and had resolved. A total of 14 days of oral antibiotics. She was discharged home on prednisone 40 mg by mouth daily. She is intolerant to sulfa and mesalamine's. Discussions were had during her hospitalization regarding starting on biologic therapy, specifically Entyvio-and approval is pending. Hepatitis serologies were negative and QuantiFERON Gold negative during hospitalization. She comes in today for post hospital follow-up stating that she's feeling very well and her only complaint is some shortness of breath with exertion which she says she's had ever since she came home from the hospital. She says her strength is improving, she's not currently having any abdominal pain very little cramping and urgency and only having 1-2 somewhat formed bowel movements per day. She has not had any bleeding no fevers chills etc. Overall she's very pleased with her progress. She says she just completed a few day course of prednisone at 30 mg by mouth daily. Patient had spoken to a patient representative for Entyvio earlier today and unfortunately with her Medicare to pay a co-pay and then at least 20% of the cost of the into the O which she cannot afford. We have approval also pending for Remicade which she is still leery of because of her recent history of breast  cancer. She asks whether she can just be followed very closely and not started on a biologic as long as she continues to do well.   Review of Systems Pertinent positive and negative review of systems were noted in the above HPI section.  All other review of systems was otherwise negative.  Outpatient Encounter Prescriptions as of 05/14/2015  Medication Sig  . acetaminophen (TYLENOL) 325 MG tablet Take 650 mg by mouth every 6 (six) hours as needed for moderate pain.   Marland Kitchen ALPRAZolam (XANAX) 0.5 MG tablet Take 1 tablet (0.5 mg total) by mouth 2 (two) times daily as needed for anxiety.  . cyanocobalamin (,VITAMIN B-12,) 1000 MCG/ML injection INJECT 1 ML (1,000 MCG TOTAL) INTO THE MUSCLE EVERY 30 (THIRTY) DAYS. (Patient taking differently: Inject 1,000 mcg into the muscle every 30 (thirty) days. )  . dicyclomine (BENTYL) 10 MG capsule Take 1 capsule (10 mg total) by mouth 3 (three) times daily before meals.  . famotidine (PEPCID) 20 MG tablet Take 20 mg by mouth 2 (two) times daily as needed for heartburn. Reported on 04/11/2015  . FLUoxetine (PROZAC) 20 MG capsule TAKE ONE CAPSULE BY MOUTH ONCE DAILY  . HYDROcodone-acetaminophen (NORCO/VICODIN) 5-325 MG tablet Take 1-2 tablets by mouth every 6 hours as needed for pain and/or cough.  . hydrocortisone (ANUSOL-HC) 25 MG suppository Place 1 suppository (25 mg total) rectally daily.  . mesalamine (CANASA) 1000 MG suppository Place 1 suppository (1,000 mg total) rectally 2 (two) times daily.  Marland Kitchen zolpidem (AMBIEN) 5 MG tablet Take 1 tablet (5 mg total) by  mouth at bedtime as needed for sleep.  . [DISCONTINUED] zolpidem (AMBIEN) 5 MG tablet Take 1 tablet (5 mg total) by mouth at bedtime as needed for sleep.  . predniSONE (DELTASONE) 10 MG tablet Take 25 mg (  2 1/2 tablets ) x 1 week. Take 20 mg  ( 2 tablets ) x 1 week Take 15 mg ( 1/12  Tablets) x 1 week Take 10 mg ( 1 tab ) x 1 week  Take 5 mg ( 1/2 tab ) x 1 week  . [DISCONTINUED] ciprofloxacin (CIPRO)  500 MG tablet Take 1 tablet (500 mg total) by mouth 2 (two) times daily.  . [DISCONTINUED] letrozole (FEMARA) 2.5 MG tablet Take 1 tablet (2.5 mg total) by mouth daily. (Patient not taking: Reported on 04/11/2015)  . [DISCONTINUED] metroNIDAZOLE (FLAGYL) 500 MG tablet Take 1 tablet (500 mg total) by mouth 3 (three) times daily.  . [DISCONTINUED] predniSONE (DELTASONE) 10 MG tablet Continue taking 40 mg tablet daily at breakfast for 2 weeks and after that continue to taper down by 5 mg tablet weekly until completed  . [DISCONTINUED] predniSONE (DELTASONE) 10 MG tablet Take 25 mg (  2 1/2 tablets ) x 1 week. Take 20 mg  ( 2 tablets ) x 1 week Take 15 mg ( 1/12  Tablets) x 1 week Take 10 mg ( 1 tab ) x 1 week  Take 5 mg ( 1/2 tab ) x 1 week   No facility-administered encounter medications on file as of 05/14/2015.   Allergies  Allergen Reactions  . Codeine Nausea And Vomiting  . Mesalamine Nausea And Vomiting  . Other Itching    PERFUMED LOTIONS  Cant have MRI due to ear implant   . Oxycodone-Acetaminophen Rash    Itching & rash  . Sulfa Antibiotics Rash   Patient Active Problem List   Diagnosis Date Noted  . Abscess of anal or rectal region   . Peri-rectal abscess   . Dehydration   . 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 Sepsis ([place]) 04/19/2015  . Rectal pain   . Leucocytosis   . Rectal bleeding 04/18/2015  . Ulcerative colitis ([place]) 04/18/2015  . Cancer of lower-inner quadrant of left female breast ([place]) 11/30/2014   Social History   Social History  . Marital Status: Widowed    Spouse Name: N/A  . Number of Children: N/A  . Years of Education: N/A   Occupational History  . 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 Smoking status: Never Smoker   . Smokeless tobacco: Never Used  . Alcohol Use: Yes     Comment: daily wine with dinner  . Drug Use: No  . Sexual Activity:    Partners: Male   Other Topics Concern  . Not on file    Social History Narrative   Treadmill/ walking--exercise    Ms. Guagliardo's family history includes Anesthesia problems in her sister; Asthma in her sister; Brain cancer in her father; Diabetes in her brother; Emphysema in her maternal grandfather; Kidney disease in her brother; Prostate cancer (age of onset: 31) in her brother; Rheum arthritis in her brother and mother.      Objective:    Filed Vitals:   05/14/15 1417  BP: 116/62  Pulse: 80    Physical Exam   well-developed older white female in no acute distress, pleasant blood pressure 116/62 pulse 80 height 5 foot 5 weight 152 HEENT;  nontraumatic, cephalic EOMI PERRLA sclera anicteric, cardiovascular ;regular rate and rhythm with S1-S2 no murmur rub or gallop, Pulmonary; clear bilaterally, Abdomen; nontender no palpable mass or hepatosplenomegaly bowel sounds are present, Rectal; exam not done, Extremities; no clubbing cyanosis or edema skin warm and dry  No calf swelling or tenderness, Neuropsych; mood and affect appropriate     Assessment & Plan:   #1 75 yo female with long hx of ulcerative colitis -pancolitis on last colon 06/2014 with recurrent perirectal abscesses past year more consistent with Crohns colitis- recent exacerbation and abscess requiring hospitalization 03/2015. Abscess resolved on antibiotics- no drainage required Colitis much improved on steroids  #2 hx breast cancer 2016 #3 SOB- may be deconditioning from recent illness- O2 sat 98 in office   Plan; CBC, BMET. CRP CXR  Decrease prednisone to 25 mg po daily x 7 days then decrease by 5 mg weekly until off Will wait for approval for Remicade-then will need to discuss further with pt Follow up Dr Havery Moros in 3-4 weeks  Consider [facility] if unable to get Remicade approved (pt also looking in to supplemental insurance.)    Amy Genia Harold PA-C 05/14/2015   Cc: Rosalita Chessman, DO

## 2015-05-15 NOTE — Telephone Encounter (Signed)
Patient states she called [facility] and was told her out of pocket cost is $5900/year then it is covered at 100%. Sara Barnes has called [employer] and the Remicade will have the same out of pocket cost/year as the Entyvio.

## 2015-05-15 NOTE — Progress Notes (Signed)
Agree with assessment and plan. The patient is in need of a biologic therapy for her IBD given recent perforation. I will call her to discuss options given cost is a major issue at this time.

## 2015-05-16 ENCOUNTER — Telehealth: Payer: Self-pay

## 2015-05-16 NOTE — Telephone Encounter (Signed)
Left message

## 2015-05-17 NOTE — Telephone Encounter (Signed)
Called patient and left a message, will call again

## 2015-05-18 ENCOUNTER — Telehealth: Payer: Self-pay

## 2015-05-18 NOTE — Telephone Encounter (Signed)
-----   Message from Dow Adolph sent at 05/17/2015  9:22 AM EST ----- Pt is returning your call  [phone]

## 2015-05-18 NOTE — Telephone Encounter (Signed)
Spoke with Corene Cornea from [facility] who told me to have the patient call [facility] to try to get further finanical assistance.  I spoke with patient and gave her the exact instructions he gave me to try to continue to try to get patient assistance.  Patient will call and then call me back with an update.

## 2015-05-21 ENCOUNTER — Telehealth: Payer: Self-pay | Admitting: Gastroenterology

## 2015-05-21 NOTE — Telephone Encounter (Signed)
Please see other note. This patient is requesting another opinion and wishes to see Dr. Hilarie Fredrickson.

## 2015-05-21 NOTE — Telephone Encounter (Signed)
Thanks for the update Sara Barnes. I had tried to call her last week but she did not answer. If she wants another opinion or to follow with Dr. Hilarie Fredrickson that is okay.   Ulice Dash to update you on her case, she has longstanding pancolitits for years. She has been admitted with 2 perirectal abscesses in the past year. Poorly controlled on sulfasalazine and she is intolerant to mesalamine. I had recommended Entyvio for her she has a recent diagnosis of breast cancer, although we discussed anti-TNF and all other options, which she has declined all of them to date. She is tapering prednisone at this time, needs a long term maintenance regimen in my opinion stronger than sulfasalazine given her 2 abscesses. Thanks

## 2015-05-21 NOTE — Telephone Encounter (Signed)
Spoke with patient and she states she has been researching the medications that were recommended and she does not want to take them at this time. She states she is taking the medications prescribed at this time and she is doing well. She states it is not just about the money, she could work this out but she just does not want to take the medication with so many side effects. She states she wanted Dr Hilarie Fredrickson as her new GI because Dr. Inocente Salles recommended him. She states she would rather have him as her GI MD. Scheduled patient for OV with Dr. Hilarie Fredrickson on 2/13/17at 1:30 PM.

## 2015-05-23 NOTE — Telephone Encounter (Signed)
Left a message for patient to call back. 

## 2015-05-23 NOTE — Telephone Encounter (Signed)
After reviewing the chart, I agree with Dr. Doyne Keel recommendations and the need for biologic therapy I do not feel I would offer any different recommendations from Dr. Havery Moros I think she is in excellent hands and should continue care with him.  If she remains uncomfortable with this she can be referred to a tertiary care center

## 2015-05-23 NOTE — Telephone Encounter (Signed)
Dr Hilarie Fredrickson reviewed her case with me. We are all recommending the same thing (Entyvio or anti-TNF). If she wants to proceed with this, I would recommend she do so as soon as possible and continue to taper off prednisone. If she would like to see [place] first to discuss further, we can arrange that and I am happy to see her. If she is not comfortable with our plan, she can have another evaluation at a tertiary care facility.

## 2015-05-24 NOTE — Telephone Encounter (Signed)
Spoke with patient and she does not want to see Dr. Havery Moros. Spoke with Dr. Hilarie Fredrickson and he will see patient. Called patient and explained that she can keep her appointment. Encouraged patient to keep an open mind with the recommendations also.

## 2015-06-11 ENCOUNTER — Ambulatory Visit (INDEPENDENT_AMBULATORY_CARE_PROVIDER_SITE_OTHER): Payer: Commercial Managed Care - HMO | Admitting: Internal Medicine

## 2015-06-11 ENCOUNTER — Encounter: Payer: Self-pay | Admitting: Internal Medicine

## 2015-06-11 VITALS — BP 136/72 | HR 84 | Ht 65.75 in | Wt 160.0 lb

## 2015-06-11 DIAGNOSIS — K50114 Crohn's disease of large intestine with abscess: Secondary | ICD-10-CM | POA: Diagnosis not present

## 2015-06-11 NOTE — Progress Notes (Signed)
Subjective:    Patient ID: Sara Barnes, female    DOB: 25-Apr-1941, 75 y.o.   MRN: [id number]  HPI Kashawn Manzano is a 75 year old female with long-standing history of ulcerative colitis complicated by perirectal abscess (likely Crohn's) and recent hospitalization in December 2016, history of breast cancer treated by lumpectomy and radiation in fall 2016 who is seen in office follow-up. She was managed for many years by Dr. Lyla Son and then Dr. Olevia Perches.  She was seen by Dr. Havery Moros while hospitalized. This is her first visit with me. She was last in the office on 05/14/2015 is seen by Nicoletta Ba. After the hospital she was treated with antibiotics and follow-up CT showed resolution of perirectal abscess. She had a total 14 days of antibiotics and has been tapering from prednisone 40 mg daily to now 5 mg daily.   Today she reports she is feeling considerably better. She's had no further rectal bleeding. No diarrhea. She's having 1-3 stools per day which are soft but not completely formed. Urgency for stool is much better and she's had no further accidents. She is on 5 mg of prednisone per day at this point with 5 days left. Higher dose prednisone made her jittery and have trouble with sleeping. She's also gained weight. Appetite is improved dramatically without nausea or vomiting. She is no lower taking sulfasalazine. She took sulfasalazine from the time of her last colonoscopy in March 2016 with Dr. Olevia Perches to August. This caused nausea and she has had previous intolerance/allergy to mesalamine.  Her hepatitis serologies and TB testing was negative  Review of Systems As per HPI, otherwise negative  Current Medications, Allergies, Past Medical History, Past Surgical History, Family History and Social History were reviewed in [employer] record.     Objective:   Physical Exam BP 136/72 mmHg  Pulse 84  Ht 5' 5.75" (1.67 m)  Wt 160 lb (72.576 kg)  BMI 26.02  kg/m2 Constitutional: Well-developed and well-nourished. No distress. HEENT: Normocephalic and atraumatic. Oropharynx is clear and moist. No oropharyngeal exudate. Conjunctivae are normal.  No scleral icterus. Neck: Neck supple. Trachea midline. Cardiovascular: Normal rate, regular rhythm and intact distal pulses. No M/R/G Pulmonary/chest: Effort normal and breath sounds normal. No wheezing, rales or rhonchi. Abdominal: Soft, nontender, nondistended. Bowel sounds active throughout. There are no masses palpable. No hepatosplenomegaly. Extremities: no clubbing, cyanosis, or edema Lymphadenopathy: No cervical adenopathy noted. Neurological: Alert and oriented to person place and time. Skin: Skin is warm and dry. No rashes noted. Psychiatric: Normal mood and affect. Behavior is normal.  CBC    Component Value Date/Time   WBC 11.1* 05/14/2015 1515   WBC 9.2 12/06/2014 0804   RBC 4.61 05/14/2015 1515   RBC 4.29 12/06/2014 0804   HGB 13.8 05/14/2015 1515   HGB 13.7 12/06/2014 0804   HCT 41.3 05/14/2015 1515   HCT 41.1 12/06/2014 0804   PLT 302.0 05/14/2015 1515   PLT 303 12/06/2014 0804   MCV 89.8 05/14/2015 1515   MCV 95.8 12/06/2014 0804   MCH 30.9 04/27/2015 0405   MCH 31.9 12/06/2014 0804   MCHC 33.4 05/14/2015 1515   MCHC 33.3 12/06/2014 0804   RDW 13.0 05/14/2015 1515   RDW 12.2 12/06/2014 0804   LYMPHSABS 1.1 05/14/2015 1515   LYMPHSABS 1.8 12/06/2014 0804   MONOABS 0.2 05/14/2015 1515   MONOABS 0.8 12/06/2014 0804   EOSABS 0.0 05/14/2015 1515   EOSABS 0.2 12/06/2014 0804   BASOSABS 0.1 05/14/2015 1515  BASOSABS 0.0 12/06/2014 0804   CMP     Component Value Date/Time   NA 134* 05/14/2015 1515   NA 138 12/06/2014 0804   K 4.5 05/14/2015 1515   K 4.1 12/06/2014 0804   CL 98 05/14/2015 1515   CO2 26 05/14/2015 1515   CO2 25 12/06/2014 0804   GLUCOSE 100* 05/14/2015 1515   GLUCOSE 92 12/06/2014 0804   BUN 16 05/14/2015 1515   BUN 12.3 12/06/2014 0804   CREATININE  0.72 05/14/2015 1515   CREATININE 0.8 12/06/2014 0804   CALCIUM 8.9 05/14/2015 1515   CALCIUM 9.4 12/06/2014 0804   PROT 6.8 04/18/2015 1203   PROT 6.5 12/06/2014 0804   ALBUMIN 3.3* 04/18/2015 1203   ALBUMIN 3.3* 12/06/2014 0804   AST 20 04/18/2015 1203   AST 9 12/06/2014 0804   ALT 12* 04/18/2015 1203   ALT 10 12/06/2014 0804   ALKPHOS 90 04/18/2015 1203   ALKPHOS 89 12/06/2014 0804   BILITOT 1.3* 04/18/2015 1203   BILITOT 0.37 12/06/2014 0804   GFRNONAA >60 04/27/2015 0405   GFRAA >60 04/27/2015 0405   CT ABDOMEN AND PELVIS WITH CONTRAST   TECHNIQUE: Multidetector CT imaging of the abdomen and pelvis was performed using the standard protocol following bolus administration of intravenous contrast.   CONTRAST:  12m OMNIPAQUE IOHEXOL 300 MG/ML  SOLN   COMPARISON:  Noncontrast CT on 04/18/2015   FINDINGS: Lower chest:  No acute findings.   Hepatobiliary: No masses or other significant abnormality.   Pancreas: No mass, inflammatory changes, or other significant abnormality.   Spleen: Within normal limits in size and appearance.   Adrenals/Urinary Tract: No masses identified. No evidence of hydronephrosis.   Stomach/Bowel: Again demonstrated is moderate long-segment colitis involving the transverse, descending, and rectosigmoid colon. No evidence of intraperitoneal abscess or free air. No evidence of small bowel or terminal ileal involvement. A persistent area of soft tissue density with central air bubble is seen along the posterior anal sphincter which measures approximately 1.3 x 2.4 cm on image 90/series 2. This is consistent with a small perianal abscess.   Vascular/Lymphatic: No pathologically enlarged lymph nodes. No evidence of abdominal aortic aneurysm.   Reproductive: Prior hysterectomy noted. Adnexal regions are unremarkable in appearance.   Other: None.   Musculoskeletal:  No suspicious bone lesions identified.   IMPRESSION: No significant  change in moderate colitis involving the transverse, descending, and rectosigmoid colon.   Probable small posterior perianal abscess measuring approximately 1.3 x 2.4 cm, also without significant change. If clinically warranted, consider pelvic MRI without and with contrast for further evaluation.     Electronically Signed   By: JEarle GellM.D.   On: 04/19/2015 13:36           CT PELVIS WITH CONTRAST   TECHNIQUE: Multidetector CT imaging of the pelvis was performed using the standard protocol following the bolus administration of intravenous contrast.   CONTRAST:  76mOMNIPAQUE IOHEXOL 300 MG/ML  SOLN   COMPARISON:  04/19/2015.   FINDINGS: There is thickening of the visualized portions of the descending and majority of the rectosigmoid colon. There is minimal stranding adjacent to the rectum/anus, without a discrete collection of fluid and/or air to indicate a residual abscess. Visualized portions of the cecum and ascending colon are within normal limits, as is visualized small bowel. Vascular structures are unremarkable. Hysterectomy. Ovaries are visualized. No free fluid. No pathologically enlarged lymph nodes. No worrisome lytic or sclerotic lesions. Degenerative changes are seen in the  spine.   IMPRESSION: Persistent thickening of the descending and rectosigmoid colon. Resolved perirectal/perianal abscess.     Electronically Signed   By: Lorin Picket M.D.   On: 04/24/2015 15:52                Vitals       Height Weight BMI (Calculated)      5' 5.75" (1.67 m) 152 lb 4 oz (69.06 kg) 24.8         Interpretation Summary       CLINICAL DATA:  Ulcerative colitis, perirectal abscess.   EXAM: CT PELVIS WITH CONTRAST   TECHNIQUE: Multidetector CT imaging of the pelvis was performed using the standard protocol following the bolus administration of intravenous contrast.   CONTRAST:  86m OMNIPAQUE IOHEXOL 300 MG/ML  SOLN   COMPARISON:  04/19/2015.    FINDINGS: There is thickening of the visualized portions of the descending and majority of the rectosigmoid colon. There is minimal stranding adjacent to the rectum/anus, without a discrete collection of fluid and/or air to indicate a residual abscess. Visualized portions of the cecum and ascending colon are within normal limits, as is visualized small bowel. Vascular structures are unremarkable. Hysterectomy. Ovaries are visualized. No free fluid. No pathologically enlarged lymph nodes. No worrisome lytic or sclerotic lesions. Degenerative changes are seen in the spine.   IMPRESSION: Persistent thickening of the descending and rectosigmoid colon. Resolved perirectal/perianal abscess.     Electronically Signed   By: MLorin PicketM.D.   On: 04/24/2015 15:52            Assessment & Plan:  75year old female with long-standing history of ulcerative colitis complicated by perirectal abscess (likely Crohn's) and recent hospitalization in December 2016, history of breast cancer treated by lumpectomy and radiation in fall 2016 who is seen in office follow-up.   1. Long-standing IBD, most probable Crohn's with history of perirectal abscess 2 -- she has recently been hospitalized with another perirectal abscess, this time fortunately resolving without surgery. She has improved with steroid taper and is now down to 5 mg daily without symptoms. With a long discussion today regarding her colitis and the risk of not treating on an ongoing basis. She has had this problem for many years and in my opinion has forgotten what normal feels like. While she feels well now I concerned that she will have return of her colitis symptoms, possibly even with another complication such as perirectal abscess if we do not escalate therapy. She has been intolerant to 5-ASA medications. She's had previous discussion about biologic therapy with Dr. AHavery Morosand again with me today. She is worried about malignancy  risk associated with anti-TNF therapy. She prefers observation for now but states that she will notify me if her colitis symptoms return and plans to do so before she gets "so sick". We discussed Remicade, Humira and Entyvio today.  Entyvio may be the best option for her.  I do feel she will likely escalate the biologic in the future hopefully before another complication. I've explained in my opinion treatment carries less risk of nontreatment. Follow-up May 2017, sooner if necessary  25 minutes spent with the patient today. Greater than 50% was spent in counseling and coordination of care with the patient

## 2015-06-11 NOTE — Patient Instructions (Signed)
Please complete your prednisone taper.  Call us if your symptoms return after your prednisone taper.  Please follow up in May 2017.

## 2015-06-14 ENCOUNTER — Telehealth: Payer: Self-pay

## 2015-06-14 NOTE — Telephone Encounter (Signed)
Refill #[email or web address]

## 2015-06-14 NOTE — Telephone Encounter (Signed)
Last seen 01/30/15 and filled 05/14/15 #25  Please advise    KP

## 2015-06-15 ENCOUNTER — Encounter: Payer: Self-pay | Admitting: Family Medicine

## 2015-06-15 ENCOUNTER — Ambulatory Visit (INDEPENDENT_AMBULATORY_CARE_PROVIDER_SITE_OTHER): Payer: Commercial Managed Care - HMO | Admitting: Family Medicine

## 2015-06-15 VITALS — BP 110/68 | HR 83 | Temp 98.1°F | Wt 157.8 lb

## 2015-06-15 DIAGNOSIS — J0140 Acute pansinusitis, unspecified: Secondary | ICD-10-CM

## 2015-06-15 MED ORDER — LEVOCETIRIZINE DIHYDROCHLORIDE 5 MG PO TABS: 5.0000 mg | ORAL_TABLET | Freq: Every evening | ORAL | Status: DC

## 2015-06-15 MED ORDER — LEVOFLOXACIN 500 MG PO TABS: 500.0000 mg | ORAL_TABLET | Freq: Every day | ORAL | Status: DC

## 2015-06-15 MED ORDER — ZOLPIDEM TARTRATE 5 MG PO TABS: 5.0000 mg | ORAL_TABLET | Freq: Every evening | ORAL | Status: DC | PRN

## 2015-06-15 MED ORDER — FLUTICASONE PROPIONATE 50 MCG/ACT NA SUSP: 2.0000 | Freq: Every day | NASAL | Status: DC

## 2015-06-15 NOTE — Telephone Encounter (Signed)
Rx faxed.    KP 

## 2015-06-15 NOTE — Progress Notes (Signed)
  Subjective:     Sara Barnes is a 75 y.o. female who presents for evaluation of sinus pain. Symptoms include: congestion, cough, facial pain, headaches, nasal congestion, sinus pressure, sore throat and tooth pain. Onset of symptoms was 6 days ago. Symptoms have been gradually worsening since that time. Past history is significant for no history of pneumonia or bronchitis. Patient is a non-smoker.  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 She  does not have any pertinent problems on file. She  has past surgical history that includes Abdominal hysterectomy; Appendectomy; Breast enhancement surgery; Incision and drainage perirectal abscess (N/A, 09/28/2013); Breast lumpectomy (Left); Inner ear surgery (Bilateral); Colonoscopy with propofol (05/28/2011; 06/28/2014); and Breast lumpectomy with radioactive seed and sentinel lymph node biopsy (Left, 12/13/2014). Her family history includes Anesthesia problems in her sister; Asthma in her sister; Brain cancer in her father; Diabetes in her brother; Emphysema in her maternal grandfather; Kidney disease in her brother; Prostate cancer (age of onset: 58) in her brother; Rheum arthritis in her brother and mother. She  reports that she has never smoked. She has never used smokeless tobacco. She reports that she drinks alcohol. She reports that she does not use illicit drugs. She has a current medication list which includes the following prescription(s): acetaminophen, cyanocobalamin, famotidine, fluoxetine, and zolpidem. Current Outpatient Prescriptions on File Prior to Visit   Medication Sig Dispense Refill  . acetaminophen (TYLENOL) 325 MG tablet Take 650 mg by mouth every 6 (six) hours as needed for moderate pain.     . cyanocobalamin (,VITAMIN B-12,) 1000 MCG/ML injection INJECT 1 ML (1,000 MCG TOTAL) INTO THE MUSCLE EVERY 30 (THIRTY) DAYS. (Patient taking differently: Inject 1,000 mcg into the muscle every 30 (thirty) days. ) 3 mL 1  . famotidine (PEPCID) 20 MG tablet Take 20 mg by mouth 2 (two) times daily as needed for heartburn. Reported on 04/11/2015    . FLUoxetine (PROZAC) 20 MG capsule TAKE ONE CAPSULE BY MOUTH ONCE DAILY 90 capsule 1  . zolpidem (AMBIEN) 5 MG tablet Take 1 tablet (5 mg total) by mouth at bedtime as needed. 30 tablet 0   No current facility-administered medications on file prior to visit.   She is allergic to codeine; mesalamine; other; oxycodone-acetaminophen; and sulfa antibiotics..  Review of Systems Pertinent items are noted in HPI.   Objective:    BP 110/68 mmHg  Pulse 83  Temp(Src) 98.1 F (36.7 C) (Oral)  Wt 157 lb 12.8 oz (71.578 kg)  SpO2 98% General appearance: alert, cooperative, appears stated age and no distress Ears: L tm x fluid  Nose: green discharge, moderate congestion, turbinates red, swollen, sinus tenderness bilateral Throat: lips, mucosa, and tongue normal; teeth and gums normal Neck: mild anterior cervical adenopathy Lungs: clear to auscultation bilaterally Heart: S1, S2 normal    Assessment:    Acute bacterial sinusitis.    Plan:    Nasal steroids per medication orders. Antihistamines per medication orders. levaquin per medication orders.

## 2015-06-15 NOTE — Patient Instructions (Signed)

## 2015-06-15 NOTE — Progress Notes (Signed)
Pre visit review using our clinic review tool, if applicable. No additional management support is needed unless otherwise documented below in the visit note. 

## 2015-06-20 ENCOUNTER — Ambulatory Visit: Payer: Commercial Managed Care - HMO | Admitting: Internal Medicine

## 2015-07-30 ENCOUNTER — Telehealth: Payer: Self-pay | Admitting: Internal Medicine

## 2015-07-30 NOTE — Telephone Encounter (Signed)
Attempted to return pts call but phone number will not ring.

## 2015-07-31 ENCOUNTER — Other Ambulatory Visit: Payer: Self-pay

## 2015-07-31 DIAGNOSIS — R197 Diarrhea, unspecified: Secondary | ICD-10-CM

## 2015-07-31 NOTE — Telephone Encounter (Signed)
Spoke with pt and she is aware and will come and give stool specimen. Orders in epic.

## 2015-07-31 NOTE — Telephone Encounter (Signed)
Would recommend checking C. difficile PCR This is very likely flare of known IBD Long discussion at last clinic visit. She very well may need prednisone again, is C. difficile negative My opinion remains that she needs biologic therapy which we have previously discussed She can continue Canasa but this will only treat the very distal colon/rectum and her disease involves more than the rectum She has been intolerant of oral 5-ASA's

## 2015-07-31 NOTE — Telephone Encounter (Signed)
Pt states that about a week ago she started having bright red blood from her rectum again and diarrhea. Pt also states she is having some rectal pain that comes and goes. States the bleeding increased a couple of days ago. Pt reports she refilled her canasa supp and started using them about a week ago. Pt is concerned. Please advise.

## 2015-08-02 ENCOUNTER — Other Ambulatory Visit: Payer: Commercial Managed Care - HMO

## 2015-08-02 DIAGNOSIS — R197 Diarrhea, unspecified: Secondary | ICD-10-CM | POA: Diagnosis not present

## 2015-08-03 LAB — CLOSTRIDIUM DIFFICILE BY PCR: CDIFFPCR: NOT DETECTED

## 2015-08-06 ENCOUNTER — Other Ambulatory Visit: Payer: Self-pay

## 2015-08-06 ENCOUNTER — Telehealth: Payer: Self-pay | Admitting: Internal Medicine

## 2015-08-06 MED ORDER — PREDNISONE 10 MG PO TABS: ORAL_TABLET | ORAL | Status: DC

## 2015-08-06 NOTE — Telephone Encounter (Signed)
Prednisone 40 mg daily for 1 week, then 30 mg daily for 1 week. Dr. Hilarie Fredrickson to decide on dosing following the initial 2 weeks of prednisone.

## 2015-08-06 NOTE — Telephone Encounter (Signed)
Last seen and filled 06/15/15 #30  Please advise     KP

## 2015-08-06 NOTE — Telephone Encounter (Signed)
Doc of the day The  C-diff is negative. Dr Hilarie Fredrickson had planned to start her on prednisone for a presumed flare of IBD. Below is the note from 07/31/15.   "Would recommend checking C. difficile PCR This is very likely flare of known IBD Long discussion at last clinic visit. She very well may need prednisone again, is C. difficile negative My opinion remains that she needs biologic therapy which we have previously discussed She can continue Canasa but this will only treat the very distal colon/rectum and her disease involves more than the rectum She has been intolerant of oral 5-ASA's."  Pt states that about a week ago she started having bright red blood from her rectum again and diarrhea. Pt also states she is having some rectal pain that comes and goes. States the bleeding increased a couple of days ago. Pt reports she refilled her canasa supp and started using them about a week ago. Pt is concerned. Please advise.

## 2015-08-06 NOTE — Telephone Encounter (Signed)
I'm assuming its for ambien but I cant see it-----if so refill x1  2 reflls

## 2015-08-06 NOTE — Telephone Encounter (Signed)
Patient notified of instructions.  Rx sent. She is aware that we will call back with additional instructions once Dr. Hilarie Fredrickson has reviewed.   Dr. Hilarie Fredrickson please advise on additional taper instructions.

## 2015-08-07 MED ORDER — ZOLPIDEM TARTRATE 5 MG PO TABS: 5.0000 mg | ORAL_TABLET | Freq: Every evening | ORAL | Status: DC | PRN

## 2015-08-07 NOTE — Telephone Encounter (Signed)
Rx printed and given to provider for review and signature.

## 2015-08-08 NOTE — Telephone Encounter (Signed)
Was this Rx faxed?

## 2015-08-08 NOTE — Telephone Encounter (Addendum)
Called pharmacy.  Rx received and has been picked up by patient.

## 2015-08-15 NOTE — Telephone Encounter (Signed)
Called patient back. No answer. Left message on her voicemail.  08/17/2015: I called and reach the patient by phone today. She reports that unfortunately her mother passed away at age 74 on 06/28/2022 of this week. --From a colitis standpoint she took 40 mg of prednisone for 1 week and on Tuesday, 3 days ago decreased to 30 mg daily. Colitis symptoms are "much much" better. She estimates 75% better. She is having less diarrhea and much less rectal bleeding with stools. Abdominal pain has improved and is almost gone. She is having 4-5 bowel movements a day with some urgency but is very pleased with her improvement. No fevers or chills. No nausea or vomiting. Voracious appetite on prednisone --Plan: Continue 30 mg of prednisone until next Tuesday (08/21/15) at which time she would decrease to 20 mg 7 days. From there decrease by 5 mg every 7 days until off. She has follow-up with me already scheduled in May Call if worsening, or failing to improve further She states she is willing to discuss and entertain biologic therapy follow-up continues to be recommended by me and previous GI MDs

## 2015-08-15 NOTE — Telephone Encounter (Signed)
Called pt and gave her the cdiff results that were negative, see result note. Pt states that she has not been happy with her follow-up from the office. Expressed concern that it had been a long time for results. Discussed with her that several messages had been left and that Dr. Hilarie Fredrickson had been out of town for a week. Pt states she was given prednisone last week by Dr. Fuller Plan and was told to take 33m daily for a week then 371mdaily for a week and then Dr. PyHilarie Fredricksonould decide dosing from there and the office would call her with results. Pt expressed concern that it wasn't Dr. PyVena Ruaault if the office staff did not care enough to communicate with him. Informed pt that the message was sent to Dr. PyHilarie Fredricksonn April 10th but that I would send it to him again. Pt verbalized understanding.

## 2015-09-11 ENCOUNTER — Other Ambulatory Visit (INDEPENDENT_AMBULATORY_CARE_PROVIDER_SITE_OTHER): Payer: Commercial Managed Care - HMO

## 2015-09-11 ENCOUNTER — Telehealth: Payer: Self-pay | Admitting: Family Medicine

## 2015-09-11 ENCOUNTER — Encounter: Payer: Self-pay | Admitting: Internal Medicine

## 2015-09-11 ENCOUNTER — Other Ambulatory Visit: Payer: Self-pay

## 2015-09-11 ENCOUNTER — Ambulatory Visit (INDEPENDENT_AMBULATORY_CARE_PROVIDER_SITE_OTHER): Payer: Commercial Managed Care - HMO | Admitting: Internal Medicine

## 2015-09-11 VITALS — BP 102/68 | HR 70 | Ht 65.75 in | Wt 157.0 lb

## 2015-09-11 DIAGNOSIS — K611 Rectal abscess: Secondary | ICD-10-CM

## 2015-09-11 DIAGNOSIS — E538 Deficiency of other specified B group vitamins: Secondary | ICD-10-CM

## 2015-09-11 DIAGNOSIS — K51911 Ulcerative colitis, unspecified with rectal bleeding: Secondary | ICD-10-CM | POA: Diagnosis not present

## 2015-09-11 DIAGNOSIS — L989 Disorder of the skin and subcutaneous tissue, unspecified: Secondary | ICD-10-CM

## 2015-09-11 DIAGNOSIS — K6289 Other specified diseases of anus and rectum: Secondary | ICD-10-CM

## 2015-09-11 DIAGNOSIS — K51819 Other ulcerative colitis with unspecified complications: Secondary | ICD-10-CM

## 2015-09-11 DIAGNOSIS — Z8719 Personal history of other diseases of the digestive system: Secondary | ICD-10-CM

## 2015-09-11 LAB — COMPREHENSIVE METABOLIC PANEL
ALT: 13 U/L (ref 0–35)
AST: 14 U/L (ref 0–37)
Albumin: 4.2 g/dL (ref 3.5–5.2)
Alkaline Phosphatase: 69 U/L (ref 39–117)
BILIRUBIN TOTAL: 0.5 mg/dL (ref 0.2–1.2)
BUN: 17 mg/dL (ref 6–23)
CALCIUM: 9.7 mg/dL (ref 8.4–10.5)
CHLORIDE: 106 meq/L (ref 96–112)
CO2: 26 meq/L (ref 19–32)
Creatinine, Ser: 0.78 mg/dL (ref 0.40–1.20)
GFR: 76.76 mL/min (ref >60.00)
GLUCOSE: 106 mg/dL — AB (ref 70–99)
POTASSIUM: 4.5 meq/L (ref 3.5–5.1)
Sodium: 139 mEq/L (ref 135–145)
Total Protein: 7.1 g/dL (ref 6.0–8.3)

## 2015-09-11 LAB — CBC WITH DIFFERENTIAL/PLATELET
Basophils Absolute: 0 10*3/uL (ref 0.0–0.1)
Basophils Relative: 0.2 % (ref 0.0–3.0)
EOS PCT: 0.3 % (ref 0.0–5.0)
Eosinophils Absolute: 0 10*3/uL (ref 0.0–0.7)
HEMATOCRIT: 45.8 % (ref 36.0–46.0)
Hemoglobin: 15.8 g/dL — ABNORMAL HIGH (ref 12.0–15.0)
LYMPHS ABS: 1.4 10*3/uL (ref 0.7–4.0)
Lymphocytes Relative: 14.9 % (ref 12.0–46.0)
MCHC: 34.5 g/dL (ref 30.0–36.0)
MCV: 89.9 fl (ref 78.0–100.0)
MONOS PCT: 3.4 % (ref 3.0–12.0)
Monocytes Absolute: 0.3 10*3/uL (ref 0.1–1.0)
NEUTROS PCT: 81.2 % — AB (ref 43.0–77.0)
Neutro Abs: 7.4 10*3/uL (ref 1.4–7.7)
PLATELETS: 230 10*3/uL (ref 150.0–400.0)
RBC: 5.09 Mil/uL (ref 3.87–5.11)
RDW: 13.6 % (ref 11.5–15.5)
WBC: 9.1 10*3/uL (ref 4.0–10.5)

## 2015-09-11 LAB — HIGH SENSITIVITY CRP: CRP, High Sensitivity: 5.06 mg/L — ABNORMAL HIGH (ref 0.000–5.000)

## 2015-09-11 MED ORDER — CYANOCOBALAMIN 1000 MCG/ML IJ SOLN: INTRAMUSCULAR | Status: DC

## 2015-09-11 MED ORDER — NA SULFATE-K SULFATE-MG SULF 17.5-3.13-1.6 GM/177ML PO SOLN: ORAL | Status: DC

## 2015-09-11 NOTE — Telephone Encounter (Signed)
Caller name:Dottie/Dr Pyrtle CMA Relationship to patient: Can be [phone] Pharmacy:  Reason for call:patient was seen by Dr Hilarie Fredrickson, pt has non-healing face skin lesion. Due to patient's insurance; [facility], referral must come from PCP. Can referral be placed?

## 2015-09-11 NOTE — Telephone Encounter (Signed)
Referral for DERM?  Ok

## 2015-09-11 NOTE — Patient Instructions (Addendum)
You have been scheduled for a colonoscopy. Please follow written instructions given to you at your visit today.  Please pick up your prep supplies at the pharmacy within the next 1-3 days. If you use inhalers (even only as needed), please bring them with you on the day of your procedure. Your physician has requested that you go to www.startemmi.com and enter the access code given to you at your visit today. This web site gives a general overview about your procedure. However, you should still follow specific instructions given to you by our office regarding your preparation for the procedure.  You have been scheduled for a CT scan of the abdomen and pelvis at [facility] ([address]---this is in the same building as [facility]).   You are scheduled on Thursday, 09/13/15 at 3:30 pm. You should arrive 15 minutes prior to your appointment time for registration. Please follow the written instructions below on the day of your exam:  WARNING: IF YOU ARE ALLERGIC TO IODINE/X-RAY DYE, PLEASE NOTIFY RADIOLOGY IMMEDIATELY AT [phone]! YOU WILL BE GIVEN A 13 HOUR PREMEDICATION PREP.  1) Do not eat or drink anything after 11:30 am (4 hours prior to your test) 2) You have been given 2 bottles of oral contrast to drink. The solution may taste better if refrigerated, but do NOT add ice or any other liquid to this solution. Shake well before drinking.    Drink 1 bottle of contrast @ 1:30 am (2 hours prior to your exam)  Drink 1 bottle of contrast @ 2:30 am (1 hour prior to your exam)  You may take any medications as prescribed with a small amount of water except for the following: Metformin, Glucophage, Glucovance, Avandamet, Riomet, Fortamet, Actoplus Met, Janumet, Glumetza or Metaglip. The above medications must be held the day of the exam AND 48 hours after the exam.  The purpose of you drinking the oral contrast is to aid in the visualization of your intestinal tract. The contrast  solution may cause some diarrhea. Before your exam is started, you will be given a small amount of fluid to drink. Depending on your individual set of symptoms, you may also receive an intravenous injection of x-ray contrast/dye. Plan on being at [facility] for 30 minutes or longer, depending on the type of exam you are having performed.  This test typically takes 30-45 minutes to complete.  If you have any questions regarding your exam or if you need to reschedule, you may call the CT department at [phone] between the hours of 8:00 am and 5:00 pm, Monday-Friday.  ________________________________________________________________________  Your physician has requested that you go to the basement for the following lab work before leaving today: CBC, CMP, CRP, Hepatitis B antigen, antibody, core total, B12 level  Please continue Prednisone 10 mg daily.  We have contacted Dr Lowne's office (Jen) to see if she can get an appointment with dermatology for you. They should be contacting you with an appointment.  If you are age 65 or older, your body mass index should be between 23-30. Your Body mass index is 25.54 kg/(m^2). If this is out of the aforementioned range listed, please consider follow up with your Primary Care Provider.  If you are age 64 or younger, your body mass index should be between 19-25. Your Body mass index is 25.54 kg/(m^2). If this is out of the aformentioned range listed, please consider follow up with your Primary Care Provider.    

## 2015-09-11 NOTE — Progress Notes (Signed)
Subjective:    Patient ID: Sara Barnes, female    DOB: [birth year], 75 y.o.   MRN: [id number]  HPI Chanise Habeck is a 75 year old female with long-standing ulcerative colitis versus Crohn's complicated by perirectal abscess and rectal bleeding who is here for follow-up. She also has a history of breast cancer treated by lumpectomy and radiation in the fall of 2016. She was initially seen by me in February 2017 which was after being hospitalized for perirectal abscess. She has been steroid dependent recently and required another steroid taper since being seen in February. See telephone note from April 2017.   Recently she reports she has weaned down to prednisone 10 mg daily. She still has multiple bowel movements each morning and reports that she is pretty much "housebound" until noon each day due to bowel movements associated with urgency and frequency. Bowel movements are often bloody. She's had some rectal pain worse with walking. She is also noted occasional lower abdominal cramping. She often avoids lunch to avoid loose stools. After dinner she will have loose stools again and then also at night. Sleep has been poor and she uses occasional Ambien. She has noticed an increased in appetite since starting prednisone but is 3 pounds lower than when she was last seen. She has noticed some easy bruising with starting prednisone. She denies fever or chills. Denies nausea or vomiting.  She has noticed a nonhealing spot on her left cheek under her eye.  She also reports tingling in her bilateral feet as well as a "coldness" at night  Review of Systems As per history of present illness, otherwise negative  Current Medications, Allergies, Past Medical History, Past Surgical History, Family History and Social History were reviewed in [employer] record.     Objective:   Physical Exam BP 102/68 mmHg  Pulse 70  Ht 5' 5.75" (1.67 m)  Wt 157 lb (71.215 kg)  BMI 25.54  kg/m2 Constitutional: Well-developed and well-nourished. No distress. HEENT: Normocephalic and atraumatic. Oropharynx is clear and moist. No oropharyngeal exudate. Conjunctivae are normal.  No scleral icterus. Neck: Neck supple. Trachea midline. Cardiovascular: Normal rate, regular rhythm and intact distal pulses. No M/R/G Pulmonary/chest: Effort normal and breath sounds normal. No wheezing, rales or rhonchi. Abdominal: Soft, nontender, nondistended. Bowel sounds active throughout. Extremities: no clubbing, cyanosis, or edema Neurological: Alert and oriented to person place and time. Skin: Skin is warm and dry. Raised palpable skin lesion left maxillary cheek likely actinic keratosis Psychiatric: Normal mood and affect. Behavior is normal.  Prior cross-sectional imaging reviewed    Assessment & Plan:   75 year old female with long-standing ulcerative colitis versus Crohn's complicated by perirectal abscess and rectal bleeding who is here for follow-up.  1. Long-standing colitis, currently active, history of perirectal abscess 2 -- she continues to have significant issues with active colitis specifically lower abdominal pain, frequent diarrhea, rectal bleeding, fatigue. C. difficile was checked recently and negative. She is feeling better after recent prednisone taper and has weaned down to 10 mg daily but continues to have bloody loose stools. I have again reiterated my recommendation to escalate therapy, as prior GI physicians have, to biologic therapy to control symptoms. We discussed Remicade, Humira and Entyvio. She reports she priced Entyvio and it will be cost prohibitive for her. We also discussed the risks in great detail including the risk of infection (including reactivation of latent TB and underlying viral hepatitis), hepatotoxicity, leukopenia, pancreatitis, nausea, malignancy (specifically lymphoma), demyelinating disease, rash, and  even heart failure. After this discussion I think  she would be most appropriate for Remicade and she feels more comfortable given the fact this medication has been available for a long time in IBD. She had a negative quantiferon gold earlier this year. Repeat hepatitis B serologies today. --Continue prednisone 10 mg daily for now until escalation of therapy --Given perirectal pain and her history I recommended repeat CT scan of the abdomen and pelvis/MRI pelvis was considered but she has auditory implants which she was told were not compatible with MRI --Repeat colonoscopy recommended to evaluate disease activity prior to biologic therapy. We discussed the risk benefits and alternatives and she is agreeable to proceed --CBC, CMP, CRP, B12 today  2. Bilateral lower extremity/foot tingling -- recheck B12. She admits to having stopped her IM B12 because she was worried it caused her breast cancer. I recommend she resume IM B12.  3. Skin lesion -- left cheek, dermatology referral  45 minutes spent with the patient today. Greater than 50% was spent in counseling and coordination of care with the patient

## 2015-09-12 ENCOUNTER — Other Ambulatory Visit: Payer: Self-pay | Admitting: Family Medicine

## 2015-09-12 ENCOUNTER — Encounter: Payer: Self-pay | Admitting: Internal Medicine

## 2015-09-12 DIAGNOSIS — L989 Disorder of the skin and subcutaneous tissue, unspecified: Secondary | ICD-10-CM

## 2015-09-12 LAB — HEPATITIS B CORE ANTIBODY, TOTAL: HEP B C TOTAL AB: NONREACTIVE

## 2015-09-12 LAB — HEPATITIS B SURFACE ANTIBODY,QUALITATIVE: HEP B S AB: NEGATIVE

## 2015-09-12 LAB — HEPATITIS B SURFACE ANTIGEN: Hepatitis B Surface Ag: NEGATIVE

## 2015-09-12 NOTE — Telephone Encounter (Signed)
Referral placed per patient request. Note made regarding patients choice of Dr. Hilarie Fredrickson.

## 2015-09-13 ENCOUNTER — Ambulatory Visit (INDEPENDENT_AMBULATORY_CARE_PROVIDER_SITE_OTHER)
Admission: RE | Admit: 2015-09-13 | Discharge: 2015-09-13 | Disposition: A | Discharge status: Home / Self Care | Payer: Commercial Managed Care - HMO | Source: Ambulatory Visit | Attending: Internal Medicine | Admitting: Internal Medicine

## 2015-09-13 ENCOUNTER — Other Ambulatory Visit: Payer: Self-pay

## 2015-09-13 DIAGNOSIS — K529 Noninfective gastroenteritis and colitis, unspecified: Secondary | ICD-10-CM | POA: Diagnosis not present

## 2015-09-13 DIAGNOSIS — K6289 Other specified diseases of anus and rectum: Secondary | ICD-10-CM | POA: Diagnosis not present

## 2015-09-13 DIAGNOSIS — K51911 Ulcerative colitis, unspecified with rectal bleeding: Secondary | ICD-10-CM | POA: Diagnosis not present

## 2015-09-13 DIAGNOSIS — K5190 Ulcerative colitis, unspecified, without complications: Secondary | ICD-10-CM

## 2015-09-13 DIAGNOSIS — E538 Deficiency of other specified B group vitamins: Secondary | ICD-10-CM

## 2015-09-13 MED ORDER — IOPAMIDOL (ISOVUE-300) INJECTION 61%
100.0000 mL | Freq: Once | INTRAVENOUS | Status: AC | PRN
  Administered 2015-09-13: 100 mL via INTRAVENOUS

## 2015-09-20 ENCOUNTER — Ambulatory Visit (INDEPENDENT_AMBULATORY_CARE_PROVIDER_SITE_OTHER): Payer: Commercial Managed Care - HMO | Admitting: Internal Medicine

## 2015-09-20 DIAGNOSIS — Z23 Encounter for immunization: Secondary | ICD-10-CM

## 2015-09-20 DIAGNOSIS — K5190 Ulcerative colitis, unspecified, without complications: Secondary | ICD-10-CM | POA: Diagnosis not present

## 2015-09-25 ENCOUNTER — Ambulatory Visit (AMBULATORY_SURGERY_CENTER): Payer: Commercial Managed Care - HMO | Admitting: Internal Medicine

## 2015-09-25 ENCOUNTER — Encounter: Payer: Self-pay | Admitting: Internal Medicine

## 2015-09-25 VITALS — BP 135/54 | HR 64 | Temp 96.8°F | Resp 17 | Ht 65.75 in | Wt 157.0 lb

## 2015-09-25 DIAGNOSIS — Z1211 Encounter for screening for malignant neoplasm of colon: Secondary | ICD-10-CM

## 2015-09-25 DIAGNOSIS — F329 Major depressive disorder, single episode, unspecified: Secondary | ICD-10-CM | POA: Diagnosis not present

## 2015-09-25 DIAGNOSIS — D125 Benign neoplasm of sigmoid colon: Secondary | ICD-10-CM | POA: Diagnosis not present

## 2015-09-25 DIAGNOSIS — K219 Gastro-esophageal reflux disease without esophagitis: Secondary | ICD-10-CM | POA: Diagnosis not present

## 2015-09-25 DIAGNOSIS — K529 Noninfective gastroenteritis and colitis, unspecified: Secondary | ICD-10-CM | POA: Diagnosis not present

## 2015-09-25 DIAGNOSIS — K5190 Ulcerative colitis, unspecified, without complications: Secondary | ICD-10-CM | POA: Diagnosis not present

## 2015-09-25 DIAGNOSIS — K635 Polyp of colon: Secondary | ICD-10-CM

## 2015-09-25 DIAGNOSIS — K51011 Ulcerative (chronic) pancolitis with rectal bleeding: Secondary | ICD-10-CM

## 2015-09-25 MED ORDER — SODIUM CHLORIDE 0.9 % IV SOLN: 500.0000 mL | INTRAVENOUS | Status: DC

## 2015-09-25 NOTE — Progress Notes (Signed)
Report to PACU, RN, vss, BBS= Clear.  

## 2015-09-25 NOTE — Patient Instructions (Signed)
YOU HAD AN ENDOSCOPIC PROCEDURE TODAY AT THE [facility]:   Refer to the procedure report that was given to you for any specific questions about what was found during the examination.  If the procedure report does not answer your questions, please call your gastroenterologist to clarify.  If you requested that your care partner not be given the details of your procedure findings, then the procedure report has been included in a sealed envelope for you to review at your convenience later.  YOU SHOULD EXPECT: Some feelings of bloating in the abdomen. Passage of more gas than usual.  Walking can help get rid of the air that was put into your GI tract during the procedure and reduce the bloating. If you had a lower endoscopy (such as a colonoscopy or flexible sigmoidoscopy) you may notice spotting of blood in your stool or on the toilet paper. If you underwent a bowel prep for your procedure, you may not have a normal bowel movement for a few days.  Please Note:  You might notice some irritation and congestion in your nose or some drainage.  This is from the oxygen used during your procedure.  There is no need for concern and it should clear up in a day or so.  SYMPTOMS TO REPORT IMMEDIATELY:   Following lower endoscopy (colonoscopy or flexible sigmoidoscopy):  Excessive amounts of blood in the stool  Significant tenderness or worsening of abdominal pains  Swelling of the abdomen that is new, acute  Fever of 100F or higher   For urgent or emergent issues, a gastroenterologist can be reached at any hour by calling [phone].   DIET: Your first meal following the procedure should be a small meal and then it is ok to progress to your normal diet. Heavy or fried foods are harder to digest and may make you feel nauseous or bloated.  Likewise, meals heavy in dairy and vegetables can increase bloating.  Drink plenty of fluids but you should avoid alcoholic beverages for 24  hours.  ACTIVITY:  You should plan to take it easy for the rest of today and you should NOT DRIVE or use heavy machinery until tomorrow (because of the sedation medicines used during the test).    FOLLOW UP: Our staff will call the number listed on your records the next business day following your procedure to check on you and address any questions or concerns that you may have regarding the information given to you following your procedure. If we do not reach you, we will leave a message.  However, if you are feeling well and you are not experiencing any problems, there is no need to return our call.  We will assume that you have returned to your regular daily activities without incident.  If any biopsies were taken you will be contacted by phone or by letter within the next 1-3 weeks.  Please call us at [phone] if you have not heard about the biopsies in 3 weeks.    SIGNATURES/CONFIDENTIALITY: You and/or your care partner have signed paperwork which will be entered into your electronic medical record.  These signatures attest to the fact that that the information above on your After Visit Summary has been reviewed and is understood.  Full responsibility of the confidentiality of this discharge information lies with you and/or your care-partner. 

## 2015-09-25 NOTE — Op Note (Signed)
[place] Patient Name: Sara Barnes Procedure Date: 09/25/2015 11:08 AM MRN: [id number] Endoscopist: Jerene Bears , MD Age: 75 Referring MD:  Date of Birth: Oct 23, 1940 Gender: Female Procedure:                Colonoscopy Indications:              High risk colon cancer surveillance: Ulcerative                            pancolitis, steroid-refractor ulcerative colitis                            with rectal bleeding Medicines:                Monitored Anesthesia Care Procedure:                Pre-Anesthesia Assessment:                           - Prior to the procedure, a History and Physical                            was performed, and patient medications and                            allergies were reviewed. The patient's tolerance of                            previous anesthesia was also reviewed. The risks                            and benefits of the procedure and the sedation                            options and risks were discussed with the patient.                            All questions were answered, and informed consent                            was obtained. Prior Anticoagulants: The patient has                            taken no previous anticoagulant or antiplatelet                            agents. ASA Grade Assessment: III - A patient with                            severe systemic disease. After reviewing the risks                            and benefits, the patient was deemed in  satisfactory condition to undergo the procedure.                           After obtaining informed consent, the colonoscope                            was passed under direct vision. Throughout the                            procedure, the patient's blood pressure, pulse, and                            oxygen saturations were monitored continuously. The                            Model PCF-H190L [id number] [id number]) scope was introduced                through the anus and advanced to the the terminal                            ileum. The colonoscopy was performed without                            difficulty. The patient tolerated the procedure                            well. The quality of the bowel preparation was                            good. The terminal ileum was photographed. Scope In: 98:92:11 AM Scope Out: 94:17:40 AM Scope Withdrawal Time: 0 hours 19 minutes 32 seconds  Total Procedure Duration: 0 hours 24 minutes 21 seconds  Findings:                 The perianal exam findings include skin tags.                           The terminal ileum appeared normal.                           Inflammation characterized by congestion (edema),                            erythema, granularity, loss of vascularity and                            aphthous ulcerations was found in a continuous and                            circumferential pattern from the rectum to the                            transverse colon. The proximal transverse colon,  the ascending colon and the cecum were spared. This                            was moderate in severity. Four biopsies were taken                            every 10 cm with a cold forceps from the entire                            colon for ulcerative colitis surveillance. These                            biopsy specimens from the right colon, left colon                            and rectum were sent to Pathology.                           A 8 mm polyp was found in the mid sigmoid colon.                            The polyp was pedunculated. The polyp was removed                            with a hot snare. Resection and retrieval were                            complete. For hemostasis, one hemostatic clip was                            successfully placed. There was no bleeding at the                            end of the procedure.                            No additional abnormalities were found on                            retroflexion. Complications:            No immediate complications. Estimated Blood Loss:     Estimated blood loss was minimal. Impression:               - The examined portion of the ileum was normal.                           - Inflammatory bowel disease. Inflammation was                            found from the rectum to the transverse colon. This                            was moderate in severity.  Biopsied.                           - One 8 mm polyp in the mid sigmoid colon, removed                            with a hot snare. Resected and retrieved. Clip was                            placed. Recommendation:           - Patient has a contact number available for                            emergencies. The signs and symptoms of potential                            delayed complications were discussed with the                            patient. Return to normal activities tomorrow.                            Written discharge instructions were provided to the                            patient.                           - Resume previous diet.                           - Continue present medications.                           - Await pathology results.                           - Repeat colonoscopy is recommended for                            surveillance. The colonoscopy date will be                            determined after pathology results from today's                            exam become available for review.                           - Initiation of biologic therapy recommended Jerene Bears, MD 09/25/2015 11:50:31 AM This report has been signed electronically.

## 2015-09-25 NOTE — Progress Notes (Signed)
Called to room to assist during endoscopic procedure.  Patient ID and intended procedure confirmed with present staff. Received instructions for my participation in the procedure from the performing physician.  

## 2015-09-26 ENCOUNTER — Telehealth: Payer: Self-pay | Admitting: *Deleted

## 2015-09-26 NOTE — Telephone Encounter (Signed)
  Follow up Call-  Call back number 09/25/2015 06/28/2014  Post procedure Call Back phone  # [phone]  Permission to leave phone message Yes Yes     Patient questions:  Do you have a fever, pain , or abdominal swelling? No. Pain Score  0 *  Have you tolerated food without any problems? Yes.    Have you been able to return to your normal activities? Yes.    Do you have any questions about your discharge instructions: Diet   No. Medications  No. Follow up visit  No.  Do you have questions or concerns about your Care? No.  Actions: * If pain score is 4 or above: No action needed, pain <4.  Pt. States that her arm is really tender after receiving Hepatitis vaccine last week.  She denies that is inflammed at injection site. No drainage.  States she had to take tylenol last night because she was bothered by it so that she could not sleep.  She moves it without difficulty. I advised her that it would probably clear with time.  Since she told me she Received the injection on the third floor, I advised her to contact them if pain persists into next week.

## 2015-10-01 DIAGNOSIS — C44319 Basal cell carcinoma of skin of other parts of face: Secondary | ICD-10-CM | POA: Diagnosis not present

## 2015-10-04 ENCOUNTER — Telehealth: Payer: Self-pay | Admitting: Internal Medicine

## 2015-10-04 NOTE — Telephone Encounter (Signed)
See result note.  

## 2015-10-11 ENCOUNTER — Other Ambulatory Visit: Payer: Self-pay

## 2015-10-11 MED ORDER — FLUOXETINE HCL 20 MG PO CAPS: 20.0000 mg | ORAL_CAPSULE | Freq: Every day | ORAL | Status: DC

## 2015-10-22 ENCOUNTER — Ambulatory Visit (INDEPENDENT_AMBULATORY_CARE_PROVIDER_SITE_OTHER): Payer: Commercial Managed Care - HMO | Admitting: Internal Medicine

## 2015-10-22 DIAGNOSIS — Z23 Encounter for immunization: Secondary | ICD-10-CM | POA: Diagnosis not present

## 2015-10-22 DIAGNOSIS — K5190 Ulcerative colitis, unspecified, without complications: Secondary | ICD-10-CM | POA: Diagnosis not present

## 2015-10-29 DIAGNOSIS — C50312 Malignant neoplasm of lower-inner quadrant of left female breast: Secondary | ICD-10-CM | POA: Diagnosis not present

## 2015-11-05 ENCOUNTER — Other Ambulatory Visit: Payer: Self-pay | Admitting: Internal Medicine

## 2015-11-12 DIAGNOSIS — L905 Scar conditions and fibrosis of skin: Secondary | ICD-10-CM | POA: Diagnosis not present

## 2015-11-28 ENCOUNTER — Encounter: Payer: Self-pay | Admitting: Internal Medicine

## 2015-11-28 ENCOUNTER — Ambulatory Visit (INDEPENDENT_AMBULATORY_CARE_PROVIDER_SITE_OTHER): Payer: Commercial Managed Care - HMO | Admitting: Internal Medicine

## 2015-11-28 ENCOUNTER — Other Ambulatory Visit (INDEPENDENT_AMBULATORY_CARE_PROVIDER_SITE_OTHER): Payer: Commercial Managed Care - HMO

## 2015-11-28 VITALS — BP 142/80 | HR 76 | Ht 65.75 in | Wt 162.1 lb

## 2015-11-28 DIAGNOSIS — K51919 Ulcerative colitis, unspecified with unspecified complications: Secondary | ICD-10-CM | POA: Diagnosis not present

## 2015-11-28 LAB — CBC WITH DIFFERENTIAL/PLATELET
BASOS ABS: 0 10*3/uL (ref 0.0–0.1)
Basophils Relative: 0.5 % (ref 0.0–3.0)
EOS PCT: 0.8 % (ref 0.0–5.0)
Eosinophils Absolute: 0.1 10*3/uL (ref 0.0–0.7)
HCT: 39.1 % (ref 36.0–46.0)
Hemoglobin: 13.5 g/dL (ref 12.0–15.0)
LYMPHS ABS: 2.1 10*3/uL (ref 0.7–4.0)
Lymphocytes Relative: 23 % (ref 12.0–46.0)
MCHC: 34.5 g/dL (ref 30.0–36.0)
MCV: 93.8 fl (ref 78.0–100.0)
MONO ABS: 0.6 10*3/uL (ref 0.1–1.0)
MONOS PCT: 6.2 % (ref 3.0–12.0)
NEUTROS ABS: 6.2 10*3/uL (ref 1.4–7.7)
NEUTROS PCT: 69.5 % (ref 43.0–77.0)
PLATELETS: 238 10*3/uL (ref 150.0–400.0)
RBC: 4.17 Mil/uL (ref 3.87–5.11)
RDW: 13.9 % (ref 11.5–15.5)
WBC: 9 10*3/uL (ref 4.0–10.5)

## 2015-11-28 LAB — COMPREHENSIVE METABOLIC PANEL
ALT: 12 U/L (ref 0–35)
AST: 17 U/L (ref 0–37)
Albumin: 4.2 g/dL (ref 3.5–5.2)
Alkaline Phosphatase: 77 U/L (ref 39–117)
BUN: 9 mg/dL (ref 6–23)
CO2: 25 meq/L (ref 19–32)
Calcium: 9.7 mg/dL (ref 8.4–10.5)
Chloride: 104 mEq/L (ref 96–112)
Creatinine, Ser: 0.76 mg/dL (ref 0.40–1.20)
GFR: 79.05 mL/min (ref >60.00)
GLUCOSE: 97 mg/dL (ref 70–99)
POTASSIUM: 3.9 meq/L (ref 3.5–5.1)
SODIUM: 137 meq/L (ref 135–145)
Total Bilirubin: 0.7 mg/dL (ref 0.2–1.2)
Total Protein: 7.4 g/dL (ref 6.0–8.3)

## 2015-11-28 LAB — HIGH SENSITIVITY CRP: CRP, High Sensitivity: 6.71 mg/L — ABNORMAL HIGH (ref 0.000–5.000)

## 2015-11-28 MED ORDER — BUDESONIDE 9 MG PO TB24: ORAL_TABLET | ORAL | 1 refills | Status: DC

## 2015-11-28 NOTE — Patient Instructions (Addendum)
We have sent the following medications to your pharmacy for you to pick up at your convenience: Uceris 9 mg daily x 8 weeks  Your physician has requested that you go to the basement for the following lab work before leaving today: TPMT, CBC, CMP, CRP  Please call our office in 1 month with an update of your symptoms.  Follow up with Dr Hilarie Fredrickson on Tuesday, 02/12/16 @ 1:30 pm.  Please research Azathioprine and look at [email or web address] as well.  If you are age 52 or older, your body mass index should be between 23-30. Your Body mass index is 26.37 kg/m. If this is out of the aforementioned range listed, please consider follow up with your Primary Care Provider.  If you are age 21 or younger, your body mass index should be between 19-25. Your Body mass index is 26.37 kg/m. If this is out of the aformentioned range listed, please consider follow up with your Primary Care Provider.

## 2015-11-28 NOTE — Progress Notes (Signed)
Subjective:    Patient ID: Sara Barnes, female    DOB: [birth year], 75 y.o.   MRN: [id number]  HPI Sara Barnes is a 75 year old female with long-standing ulcerative colitis versus Crohn's complicated by perirectal abscess and rectal bleeding who is here for follow-up. She also has history of breast cancer treated by lumpectomy and radiation in fall 2016. She was last seen in the office in May 2017 and came for colonoscopy which was performed by me on 09/25/2015. This revealed a normal terminal ileum. There was inflammation consistent with IBD found from the rectum to transverse colon. This was moderate in severity. Surveillance biopsies were obtained. There was an 8 mm polyp removed from the mid sigmoid colon which was pedunculated. This was removed with hot snare and hemostatic clip was placed post polypectomy. Pathology showed moderately active chronic colitis consistent with inflammatory bowel disease. The polyp was polypoid granulation tissue with no evidence of malignancy. She had been treated with a slow prednisone taper and returns to discuss her symptoms and possible escalation of therapy.  With discontinuation of the steroids she reports her symptoms have improved. She has been off prednisone now for about 10 days. She reports multiple bowel movements in the morning without good control of her bowel movements. This is occurring 4-5 times in the morning. Usually no further bowel movements throughout the day or night. She denies blood in her stool or melena. The previous rectal bleeding and blood in her stool has resolved. She's having some rectal pain which she considers to be minor. She does report feeling abdominal bloating but denies abdominal pain. She has stopped coffee and also is eating less raw fruits and vegetables.  She remains very concerned and asks about other options other than Remicade. We have previously discussed Entyvio and she feels nervous about this medication as well. She's  read about using Biologics in people of "advanced age" and also worries about her history of cancer both breast and skin. She also worries about neurologic side effects.   Review of Systems As per history of present illness, otherwise negative  Current Medications, Allergies, Past Medical History, Past Surgical History, Family History and Social History were reviewed in [employer] record.     Objective:   Physical Exam BP (!) 142/80 (BP Location: Right Arm, Patient Position: Sitting, Cuff Size: Normal)   Pulse 76   Ht 5' 5.75" (1.67 m)   Wt 162 lb 2 oz (73.5 kg)   BMI 26.37 kg/m  Constitutional: Well-developed and well-nourished. No distress. HEENT: Normocephalic and atraumatic. Oropharynx is clear and moist. No oropharyngeal exudate. Conjunctivae are normal.  No scleral icterus. Neck: Neck supple. Trachea midline. Cardiovascular: Normal rate, regular rhythm and intact distal pulses. No M/R/G Pulmonary/chest: Effort normal and breath sounds normal. No wheezing, rales or rhonchi. Abdominal: Soft, nontender, mildly. Bowel sounds active throughout. No hepatosplenomegaly. Extremities: no clubbing, cyanosis, or edema Lymphadenopathy: No cervical adenopathy noted. Neurological: Alert and oriented to person place and time. Skin: Skin is warm and dry. No rashes noted. Psychiatric: Normal mood and affect. Behavior is normal.  CBC    Component Value Date/Time   WBC 9.0 11/28/2015 1711   RBC 4.17 11/28/2015 1711   HGB 13.5 11/28/2015 1711   HGB 13.7 12/06/2014 0804   HCT 39.1 11/28/2015 1711   HCT 41.1 12/06/2014 0804   PLT 238.0 11/28/2015 1711   PLT 303 12/06/2014 0804   MCV 93.8 11/28/2015 1711   MCV 95.8 12/06/2014 0804  MCH 30.9 04/27/2015 0405   MCHC 34.5 11/28/2015 1711   RDW 13.9 11/28/2015 1711   RDW 12.2 12/06/2014 0804   LYMPHSABS 2.1 11/28/2015 1711   LYMPHSABS 1.8 12/06/2014 0804   MONOABS 0.6 11/28/2015 1711   MONOABS 0.8 12/06/2014 0804    EOSABS 0.1 11/28/2015 1711   EOSABS 0.2 12/06/2014 0804   BASOSABS 0.0 11/28/2015 1711   BASOSABS 0.0 12/06/2014 0804   CMP     Component Value Date/Time   NA 137 11/28/2015 1711   NA 138 12/06/2014 0804   K 3.9 11/28/2015 1711   K 4.1 12/06/2014 0804   CL 104 11/28/2015 1711   CO2 25 11/28/2015 1711   CO2 25 12/06/2014 0804   GLUCOSE 97 11/28/2015 1711   GLUCOSE 92 12/06/2014 0804   BUN 9 11/28/2015 1711   BUN 12.3 12/06/2014 0804   CREATININE 0.76 11/28/2015 1711   CREATININE 0.8 12/06/2014 0804   CALCIUM 9.7 11/28/2015 1711   CALCIUM 9.4 12/06/2014 0804   PROT 7.4 11/28/2015 1711   PROT 6.5 12/06/2014 0804   ALBUMIN 4.2 11/28/2015 1711   ALBUMIN 3.3 (L) 12/06/2014 0804   AST 17 11/28/2015 1711   AST 9 12/06/2014 0804   ALT 12 11/28/2015 1711   ALT 10 12/06/2014 0804   ALKPHOS 77 11/28/2015 1711   ALKPHOS 89 12/06/2014 0804   BILITOT 0.7 11/28/2015 1711   BILITOT 0.37 12/06/2014 0804   GFRNONAA >60 04/27/2015 0405   GFRAA >60 04/27/2015 0405    hsCRP 6.7 (5.06 2 months ago)  TPMT pending     Assessment & Plan:  75 year old female with long-standing ulcerative colitis versus Crohn's complicated by perirectal abscess and rectal bleeding who is here for follow-up.  1. Ulcerative colitis -- she has had long-standing IBD and moderately active disease both endoscopically and histologically recently. She was hospitalized in 2016. She has been on a prednisone taper which has resulted in symptom improvement. She is no longer having rectal bleeding. She is still having frequent bowel movements in the morning. We had another long discussion today regarding therapeutic options. I do feel that she is appropriate for escalation of therapy either with immunomodulators or Biologics. She remains hesitant about Biologics.   We also discussed the risks in great detail including the risk of infection (including reactivation of latent TB and underlying viral hepatitis), hepatotoxicity,  leukopenia, pancreatitis, nausea, malignancy (specifically lymphoma), demyelinating disease, and even heart failure. She has been intolerant to mesalamine and sulfasalazine. She asked specifically about colonic release budesonide and I have explained how this may improve symptoms further it is not indicated for maintenance of remission and is not a long-term therapeutic option. Her CRP was checked today and remains elevated. Her blood counts have improved which is reassuring and her albumin has improved.   After this discussion she remains open to the possibility of azathioprine. I will send a TPMT today. Will treat with Uceris 9 mg daily 8 weeks. Return in October for further discussion and reassessment. He is reminded to call me if she has worsening symptoms before follow-up.  40 minutes spent with the patient today. Greater than 50% was spent in counseling and coordination of care with the patient

## 2015-11-30 ENCOUNTER — Telehealth: Payer: Self-pay | Admitting: *Deleted

## 2015-11-30 NOTE — Telephone Encounter (Signed)
[employer] has denied patient's Uceris. "The drug you asked for is non-formulary." Patient must have tried balsalazide, Apriso, and Lialda first. Please advise.Marland KitchenMarland KitchenMarland Kitchen

## 2015-12-02 NOTE — Telephone Encounter (Signed)
Pt has tried mesalamine and was intolerant, see allergies Has sulfa allergy and could not tolerated sulfasalazine 5-asa's have been exhausted Please make [facility] Medicare aware

## 2015-12-03 ENCOUNTER — Ambulatory Visit: Payer: Commercial Managed Care - HMO | Admitting: Physical Therapy

## 2015-12-04 NOTE — Telephone Encounter (Signed)
Appeal form faxed to [facility]. Awaiting response.

## 2015-12-05 ENCOUNTER — Telehealth: Payer: Self-pay | Admitting: Internal Medicine

## 2015-12-05 NOTE — Telephone Encounter (Signed)
Sara Barnes with [email or web address] that insurance denied Uceris and an appeal form was faxed yesterday to insurance company. He verbalizes understanding.

## 2015-12-06 LAB — THIOPURINE METHYLTRANSFERASE (TPMT), RBC: Thiopurine Methyltransferase, RBC: 14 nmol/hr/mL RBC

## 2015-12-10 ENCOUNTER — Telehealth: Payer: Self-pay | Admitting: Internal Medicine

## 2015-12-10 NOTE — Telephone Encounter (Signed)
I have contacted Dian Situ with [employer] ( phone [phone]) to check on the status of appeal. He states that the appeal was submitted on 12/04/15 and a response should be given within 7 days. Therefore, we should have an answer today or tomorrow. Left voicemail advising patient of this as well.

## 2015-12-11 ENCOUNTER — Other Ambulatory Visit: Payer: Self-pay

## 2015-12-11 MED ORDER — ZOLPIDEM TARTRATE 5 MG PO TABS: 5.0000 mg | ORAL_TABLET | Freq: Every evening | ORAL | 2 refills | Status: DC | PRN

## 2015-12-11 NOTE — Telephone Encounter (Signed)
Last seen 06/15/15 and filled 08/07/15 #30 with 2 rf     Please advise    KP

## 2015-12-12 NOTE — Telephone Encounter (Signed)
Appeal has been approved per [place] at [facility]. Phone reference #[id number]. Approved from 12/11/15-02/05/16. Patient has been advised of this.

## 2015-12-18 ENCOUNTER — Telehealth: Payer: Self-pay | Admitting: Internal Medicine

## 2015-12-18 NOTE — Telephone Encounter (Signed)
Noted  

## 2015-12-21 ENCOUNTER — Telehealth: Payer: Self-pay | Admitting: Internal Medicine

## 2015-12-21 NOTE — Telephone Encounter (Signed)
Faxed patient assistance application to [facility]. Informed patient that as soon as we get confirmation from [facility] we will call her. Patient verbalized understanding.

## 2015-12-24 ENCOUNTER — Ambulatory Visit (INDEPENDENT_AMBULATORY_CARE_PROVIDER_SITE_OTHER): Payer: Commercial Managed Care - HMO | Admitting: Gastroenterology

## 2015-12-24 ENCOUNTER — Inpatient Hospital Stay (HOSPITAL_COMMUNITY)
Admission: AD | Admit: 2015-12-24 | Discharge: 2015-12-29 | DRG: 387 | Disposition: A | Discharge status: Home / Self Care | Payer: Commercial Managed Care - HMO | Source: Ambulatory Visit | Attending: Family Medicine | Admitting: Family Medicine

## 2015-12-24 ENCOUNTER — Telehealth: Payer: Self-pay | Admitting: Internal Medicine

## 2015-12-24 ENCOUNTER — Encounter (HOSPITAL_COMMUNITY): Payer: Self-pay

## 2015-12-24 ENCOUNTER — Encounter: Payer: Self-pay | Admitting: Gastroenterology

## 2015-12-24 ENCOUNTER — Inpatient Hospital Stay (HOSPITAL_COMMUNITY): Payer: Commercial Managed Care - HMO

## 2015-12-24 VITALS — BP 114/66 | HR 84 | Temp 98.8°F | Ht 65.75 in | Wt 159.0 lb

## 2015-12-24 DIAGNOSIS — Z841 Family history of disorders of kidney and ureter: Secondary | ICD-10-CM | POA: Diagnosis not present

## 2015-12-24 DIAGNOSIS — K6289 Other specified diseases of anus and rectum: Secondary | ICD-10-CM

## 2015-12-24 DIAGNOSIS — K589 Irritable bowel syndrome without diarrhea: Secondary | ICD-10-CM

## 2015-12-24 DIAGNOSIS — Z9882 Breast implant status: Secondary | ICD-10-CM | POA: Diagnosis not present

## 2015-12-24 DIAGNOSIS — K51014 Ulcerative (chronic) pancolitis with abscess: Secondary | ICD-10-CM

## 2015-12-24 DIAGNOSIS — R52 Pain, unspecified: Secondary | ICD-10-CM

## 2015-12-24 DIAGNOSIS — Z833 Family history of diabetes mellitus: Secondary | ICD-10-CM

## 2015-12-24 DIAGNOSIS — R197 Diarrhea, unspecified: Secondary | ICD-10-CM | POA: Diagnosis not present

## 2015-12-24 DIAGNOSIS — E876 Hypokalemia: Secondary | ICD-10-CM | POA: Diagnosis not present

## 2015-12-24 DIAGNOSIS — K51911 Ulcerative colitis, unspecified with rectal bleeding: Secondary | ICD-10-CM | POA: Diagnosis not present

## 2015-12-24 DIAGNOSIS — K5000 Crohn's disease of small intestine without complications: Secondary | ICD-10-CM | POA: Diagnosis not present

## 2015-12-24 DIAGNOSIS — K50119 Crohn's disease of large intestine with unspecified complications: Secondary | ICD-10-CM

## 2015-12-24 DIAGNOSIS — E860 Dehydration: Secondary | ICD-10-CM | POA: Diagnosis present

## 2015-12-24 DIAGNOSIS — R198 Other specified symptoms and signs involving the digestive system and abdomen: Secondary | ICD-10-CM | POA: Diagnosis present

## 2015-12-24 DIAGNOSIS — K51011 Ulcerative (chronic) pancolitis with rectal bleeding: Secondary | ICD-10-CM | POA: Diagnosis not present

## 2015-12-24 DIAGNOSIS — E538 Deficiency of other specified B group vitamins: Secondary | ICD-10-CM | POA: Diagnosis not present

## 2015-12-24 DIAGNOSIS — Z79899 Other long term (current) drug therapy: Secondary | ICD-10-CM

## 2015-12-24 DIAGNOSIS — Z808 Family history of malignant neoplasm of other organs or systems: Secondary | ICD-10-CM | POA: Diagnosis not present

## 2015-12-24 DIAGNOSIS — K5090 Crohn's disease, unspecified, without complications: Secondary | ICD-10-CM | POA: Diagnosis present

## 2015-12-24 DIAGNOSIS — K625 Hemorrhage of anus and rectum: Secondary | ICD-10-CM | POA: Diagnosis present

## 2015-12-24 DIAGNOSIS — K611 Rectal abscess: Secondary | ICD-10-CM | POA: Diagnosis not present

## 2015-12-24 DIAGNOSIS — Z825 Family history of asthma and other chronic lower respiratory diseases: Secondary | ICD-10-CM

## 2015-12-24 DIAGNOSIS — R109 Unspecified abdominal pain: Secondary | ICD-10-CM | POA: Diagnosis not present

## 2015-12-24 DIAGNOSIS — K51919 Ulcerative colitis, unspecified with unspecified complications: Secondary | ICD-10-CM | POA: Diagnosis not present

## 2015-12-24 LAB — CBC WITH DIFFERENTIAL/PLATELET
BASOS ABS: 0 10*3/uL (ref 0.0–0.1)
Basophils Relative: 0 %
EOS PCT: 1 %
Eosinophils Absolute: 0.1 10*3/uL (ref 0.0–0.7)
HEMATOCRIT: 39.3 % (ref 36.0–46.0)
Hemoglobin: 13.1 g/dL (ref 12.0–15.0)
LYMPHS PCT: 11 %
Lymphs Abs: 1.5 10*3/uL (ref 0.7–4.0)
MCH: 32 pg (ref 26.0–34.0)
MCHC: 33.3 g/dL (ref 30.0–36.0)
MCV: 96.1 fL (ref 78.0–100.0)
MONO ABS: 0.9 10*3/uL (ref 0.1–1.0)
Monocytes Relative: 7 %
NEUTROS ABS: 10.9 10*3/uL — AB (ref 1.7–7.7)
Neutrophils Relative %: 81 %
Platelets: 255 10*3/uL (ref 150–400)
RBC: 4.09 MIL/uL (ref 3.87–5.11)
RDW: 12.3 % (ref 11.5–15.5)
WBC: 13.5 10*3/uL — AB (ref 4.0–10.5)

## 2015-12-24 LAB — COMPREHENSIVE METABOLIC PANEL
ALBUMIN: 3.5 g/dL (ref 3.5–5.0)
ALK PHOS: 90 U/L (ref 38–126)
ALT: 8 U/L — AB (ref 14–54)
AST: 11 U/L — ABNORMAL LOW (ref 15–41)
Anion gap: 7 (ref 5–15)
BUN: <5 mg/dL — ABNORMAL LOW (ref 6–20)
CALCIUM: 8.6 mg/dL — AB (ref 8.9–10.3)
CHLORIDE: 104 mmol/L (ref 101–111)
CO2: 26 mmol/L (ref 22–32)
CREATININE: 0.74 mg/dL (ref 0.44–1.00)
GFR calc non Af Amer: >60 mL/min (ref >60)
GLUCOSE: 96 mg/dL (ref 65–99)
Potassium: 3 mmol/L — ABNORMAL LOW (ref 3.5–5.1)
SODIUM: 137 mmol/L (ref 135–145)
Total Bilirubin: 0.5 mg/dL (ref 0.3–1.2)
Total Protein: 6.8 g/dL (ref 6.5–8.1)

## 2015-12-24 LAB — MAGNESIUM: Magnesium: 2.1 mg/dL (ref 1.7–2.4)

## 2015-12-24 LAB — PHOSPHORUS: PHOSPHORUS: 3.5 mg/dL (ref 2.5–4.6)

## 2015-12-24 MED ORDER — METHYLPREDNISOLONE SODIUM SUCC 40 MG IJ SOLR
40.0000 mg | INTRAMUSCULAR | Status: DC
  Administered 2015-12-24 – 2015-12-26 (×3): 40 mg via INTRAVENOUS
  Filled 2015-12-24 (×3): qty 1

## 2015-12-24 MED ORDER — FAMOTIDINE 20 MG PO TABS: 20.0000 mg | ORAL_TABLET | Freq: Two times a day (BID) | ORAL | Status: DC | PRN

## 2015-12-24 MED ORDER — ONDANSETRON HCL 4 MG/2ML IJ SOLN
4.0000 mg | Freq: Four times a day (QID) | INTRAMUSCULAR | Status: DC | PRN
  Administered 2015-12-24 – 2015-12-28 (×5): 4 mg via INTRAVENOUS
  Filled 2015-12-24 (×5): qty 2

## 2015-12-24 MED ORDER — ONDANSETRON HCL 4 MG PO TABS: 4.0000 mg | ORAL_TABLET | Freq: Four times a day (QID) | ORAL | Status: DC | PRN

## 2015-12-24 MED ORDER — FLUTICASONE PROPIONATE 50 MCG/ACT NA SUSP
2.0000 | Freq: Every day | NASAL | Status: DC
  Administered 2015-12-29: 2 via NASAL
  Filled 2015-12-24: qty 16

## 2015-12-24 MED ORDER — ACETAMINOPHEN 325 MG PO TABS
650.0000 mg | ORAL_TABLET | Freq: Four times a day (QID) | ORAL | Status: DC | PRN
  Administered 2015-12-24 – 2015-12-27 (×10): 650 mg via ORAL
  Filled 2015-12-24 (×10): qty 2

## 2015-12-24 MED ORDER — LEVOCETIRIZINE DIHYDROCHLORIDE 5 MG PO TABS: 5.0000 mg | ORAL_TABLET | Freq: Every evening | ORAL | Status: DC

## 2015-12-24 MED ORDER — ZOLPIDEM TARTRATE 5 MG PO TABS
5.0000 mg | ORAL_TABLET | Freq: Every evening | ORAL | Status: DC | PRN
  Administered 2015-12-24 – 2015-12-28 (×5): 5 mg via ORAL
  Filled 2015-12-24 (×5): qty 1

## 2015-12-24 MED ORDER — CHLORHEXIDINE GLUCONATE 0.12 % MT SOLN
15.0000 mL | Freq: Two times a day (BID) | OROMUCOSAL | Status: DC
  Administered 2015-12-24 – 2015-12-29 (×8): 15 mL via OROMUCOSAL
  Filled 2015-12-24 (×7): qty 15

## 2015-12-24 MED ORDER — LORATADINE 10 MG PO TABS: 10.0000 mg | ORAL_TABLET | Freq: Every day | ORAL | Status: DC

## 2015-12-24 MED ORDER — POTASSIUM CHLORIDE 10 MEQ/100ML IV SOLN
10.0000 meq | INTRAVENOUS | Status: AC
  Administered 2015-12-24 (×4): 10 meq via INTRAVENOUS
  Filled 2015-12-24 (×4): qty 100

## 2015-12-24 MED ORDER — IOPAMIDOL (ISOVUE-300) INJECTION 61%
100.0000 mL | Freq: Once | INTRAVENOUS | Status: AC | PRN
  Administered 2015-12-24: 100 mL via INTRAVENOUS

## 2015-12-24 MED ORDER — SODIUM CHLORIDE 0.9 % IV SOLN
INTRAVENOUS | Status: DC
  Administered 2015-12-24 – 2015-12-28 (×9): via INTRAVENOUS

## 2015-12-24 MED ORDER — CYANOCOBALAMIN 1000 MCG/ML IJ SOLN
1000.0000 ug | INTRAMUSCULAR | Status: DC
  Administered 2015-12-24: 1000 ug via INTRAMUSCULAR
  Filled 2015-12-24: qty 1

## 2015-12-24 MED ORDER — IOPAMIDOL (ISOVUE-M 300) INJECTION 61%: 15.0000 mL | Freq: Once | INTRAMUSCULAR | Status: DC | PRN

## 2015-12-24 MED ORDER — FLUOXETINE HCL 20 MG PO CAPS
20.0000 mg | ORAL_CAPSULE | Freq: Every day | ORAL | Status: DC
  Administered 2015-12-24 – 2015-12-29 (×6): 20 mg via ORAL
  Filled 2015-12-24 (×6): qty 1

## 2015-12-24 NOTE — Telephone Encounter (Signed)
Pt states she went 2 weeks without her med due to insurance not approving it at first. Pt is now taking the uceris but states it has not done anything for her. Pt states she cannot eat, she is having bad abdominal pain and cramping. Reports she is passing bright red blood and feels she is in the same shape she was the last time she was hospitalized. Pt scheduled to see Alonza Bogus PA today at 11am. Pt aware of appt.

## 2015-12-24 NOTE — H&P (Signed)
History and Physical    KAMARIE VENO GMW:[id number] DOB: 07-03-1940 DOA: 12/24/2015  PCP: Ann Held, DO  Outpatient Specialists: [facility] Patient coming from: home  Chief Complaint: abdominal pain  HPI: Sara Barnes is a 75 y.o. female with medical history significant of ulcerative colitis versus Crohn's disease, otherwise healthy, presents to the emergency room directly from gastroenterology office with complaints of abdominal pain. Patient has been complaining over the last 1-2 weeks, and worse in the last few days, she's been having excruciating lower abdominal pain every time she tries to eat something. She has nausea, however denies any vomiting. She states that she has a good appetite, and when she tries to eat this gives her severe pain. She also complains of diarrhea with mucus and blood. She denies any fevers, however complains of chills mostly in the last 3 days. She denies any chest pain, denies any shortness of breath. She has not been on Biologics, as patient was resistant the past. She has been recently started on Uceris, and she took it for the past 8 days, without any improvement. Prior to that she's been off of all medications for 2 weeks. She did not respond and has been intolerant to mesalamine and sulfasalazine. She was seen today in gastric neurology office, and due to severity of her symptoms, poor by mouth intake she was directed for direct admission.  Review of Systems: As per HPI otherwise 10 point review of systems negative.   Past Medical History:  Diagnosis Date  . Basal cell carcinoma   . 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 Complication of anesthesia    anxiety post-op after ear surgeries  . Dental crowns present    recent root canal 11/2014  . Family history of adverse reaction to anesthesia    pt's sister has hx. of post-op N/V  . Indigestion   . PONV (postoperative nausea and vomiting)   . Runny nose  12/07/2014   clear drainage, per pt.  . Seasonal allergies   . Ulcerative colitis   . Vitamin B12 deficiency     Past Surgical History:  Procedure Laterality Date  . ABDOMINAL HYSTERECTOMY     partial  . APPENDECTOMY    . BASAL CELL CARCINOMA EXCISION Left    nose  . BREAST ENHANCEMENT SURGERY    . BREAST LUMPECTOMY Left    age 57's  . BREAST LUMPECTOMY WITH RADIOACTIVE SEED AND SENTINEL LYMPH NODE BIOPSY Left 12/13/2014   Procedure: LEFT BREAST LUMPECTOMY WITH RADIOACTIVE SEED AND SENTINEL LYMPH NODE BIOPSY;  Surgeon: [name], MD;  Location: [facility];  Service: General;  Laterality: Left;  . COLONOSCOPY WITH PROPOFOL  05/28/2011; 06/28/2014  . INCISION AND DRAINAGE PERIRECTAL ABSCESS N/A 09/28/2013   Procedure: IRRIGATION AND DEBRIDEMENT PERIANAL ABSCESS, proctoscopy;  Surgeon: Shann Medal, MD;  Location: [facility];  Service: General;  Laterality: N/A;  . INNER EAR SURGERY Bilateral      reports that she has never smoked. She has never used smokeless tobacco. She reports that she drinks alcohol. She reports that she does not use drugs.  Allergies  Allergen Reactions  . Codeine Nausea And Vomiting  . Mesalamine Nausea And Vomiting  . Other Itching    PERFUMED LOTIONS  Cant have MRI due to ear implant   . Oxycodone-Acetaminophen Rash    Itching & rash  . Sulfa Antibiotics Rash    Family History  Problem Relation  Age of Onset  . Prostate cancer Brother 47  . Kidney disease Brother   . Diabetes Brother   . Brain cancer Father   . Rheum arthritis Mother   . Anesthesia problems Sister     post-op N/V  . Emphysema Maternal Grandfather   . Asthma Sister   . Rheum arthritis Brother   . Colon cancer Neg Hx   . Esophageal cancer Neg Hx   . Breast cancer Neg Hx     Prior to Admission medications   Medication Sig Start Date End Date Taking? Authorizing Provider  acetaminophen (TYLENOL) 325 MG tablet Take 650 mg by mouth every 6 (six) hours as needed for  moderate pain. Reported on 09/25/2015    Historical Provider, MD  Budesonide (UCERIS) 9 MG TB24 Take 1 tablet by mouth once daily x 8 weeks 11/28/15   Jerene Bears, MD  cyanocobalamin (,VITAMIN B-12,) 1000 MCG/ML injection INJECT 1 ML (1,000 MCG TOTAL) INTO THE MUSCLE EVERY 30 (THIRTY) DAYS. 09/11/15   Jerene Bears, MD  famotidine (PEPCID) 20 MG tablet Take 20 mg by mouth 2 (two) times daily as needed for heartburn. Reported on 04/11/2015    Historical Provider, MD  FLUoxetine (PROZAC) 20 MG capsule Take 1 capsule (20 mg total) by mouth daily. 10/11/15   Alferd Apa Lowne Chase, DO  fluticasone (FLONASE) 50 MCG/ACT nasal spray Place 2 sprays into both nostrils daily. 06/15/15   Rosalita Chessman Chase, DO  levocetirizine (XYZAL) 5 MG tablet Take 1 tablet (5 mg total) by mouth every evening. 06/15/15   Rosalita Chessman Chase, DO  zolpidem (AMBIEN) 5 MG tablet Take 1 tablet (5 mg total) by mouth at bedtime as needed. 12/11/15   Ann Held, DO    Physical Exam: Vitals:   12/24/15 1235  BP: 137/74  Pulse: 73  Temp: 99.3 F (37.4 C)  TempSrc: Oral  SpO2: 96%      Constitutional: NAD, calm, comfortable Vitals:   12/24/15 1235  BP: 137/74  Pulse: 73  Temp: 99.3 F (37.4 C)  TempSrc: Oral  SpO2: 96%   Eyes: PERRL, lids and conjunctivae normal ENMT: Mucous membranes are moist. Posterior pharynx clear of any exudate or lesions.  Neck: normal, supple  Respiratory: clear to auscultation bilaterally, no wheezing, no crackles. Normal respiratory effort. No accessory muscle use.  Cardiovascular: Regular rate and rhythm, no murmurs / rubs / gallops. No extremity edema. 2+ pedal pulses.  Abdomen: Tenderness in the mid lower abdomen Musculoskeletal: no clubbing / cyanosis. Normal muscle tone.  Skin: no rashes, lesions, ulcers. No induration Neurologic: non focal  Psychiatric: Normal judgment and insight. Alert and oriented x 3. Normal mood.   Labs on Admission: I have personally reviewed  following labs and imaging studies  CBC: No results for input(s): WBC, NEUTROABS, HGB, HCT, MCV, PLT in the last 168 hours. Basic Metabolic Panel: No results for input(s): NA, K, CL, CO2, GLUCOSE, BUN, CREATININE, CALCIUM, MG, PHOS in the last 168 hours. GFR: CrCl cannot be calculated (Patient's most recent lab result is older than the maximum 21 days allowed.). Liver Function Tests: No results for input(s): AST, ALT, ALKPHOS, BILITOT, PROT, ALBUMIN in the last 168 hours. No results for input(s): LIPASE, AMYLASE in the last 168 hours. No results for input(s): AMMONIA in the last 168 hours. Coagulation Profile: No results for input(s): INR, PROTIME in the last 168 hours. Cardiac Enzymes: No results for input(s): CKTOTAL, CKMB, CKMBINDEX, TROPONINI in the last 168  hours. BNP (last 3 results) No results for input(s): PROBNP in the last 8760 hours. HbA1C: No results for input(s): HGBA1C in the last 72 hours. CBG: No results for input(s): GLUCAP in the last 168 hours. Lipid Profile: No results for input(s): CHOL, HDL, LDLCALC, TRIG, CHOLHDL, LDLDIRECT in the last 72 hours. Thyroid Function Tests: No results for input(s): TSH, T4TOTAL, FREET4, T3FREE, THYROIDAB in the last 72 hours. Anemia Panel: No results for input(s): VITAMINB12, FOLATE, FERRITIN, TIBC, IRON, RETICCTPCT in the last 72 hours. Urine analysis:    Component Value Date/Time   COLORURINE YELLOW 04/19/2015 0838   APPEARANCEUR CLOUDY (A) 04/19/2015 0838   LABSPEC 1.013 04/19/2015 0838   PHURINE 6.0 04/19/2015 0838   GLUCOSEU NEGATIVE 04/19/2015 0838   HGBUR MODERATE (A) 04/19/2015 0838   HGBUR small 12/13/2009 0904   BILIRUBINUR NEGATIVE 04/19/2015 0838   BILIRUBINUR ne 01/30/2015 1543   KETONESUR 40 (A) 04/19/2015 0838   PROTEINUR NEGATIVE 04/19/2015 0838   UROBILINOGEN 0.2 01/30/2015 1543   UROBILINOGEN 0.2 09/25/2013 1350   NITRITE NEGATIVE 04/19/2015 0838   LEUKOCYTESUR TRACE (A) 04/19/2015 0838   Sepsis  Labs: @LABRCNTIP (procalcitonin:4,lacticidven:4) )No results found for this or any previous visit (from the past 240 hour(s)).   Radiological Exams on Admission: No results found.   Assessment/Plan Active Problems:   Rectal bleeding   Rectal pain   Dehydration   Crohn disease (HCC)    Crohn's vs UC flare  - Discussed gastroenterology, they will see patient in consultation, - Obtain CT of the abdomen and pelvis to further evaluate for presence of abscesses. For now hold antibiotics, she is afebrile and nontoxic appearing - Obtain CMP, CBC - Provide IV fluids - IV steroids and forms of IV Solu-Medrol 40 mg daily per GI recommendations  Dehydration - IVF   DVT prophylaxis: SCD  Code Status: Full  Family Communication: friend bedside Disposition Plan: admit to [facility] called: GI  Admission status: inpatient    Marzetta Board, MD [facility] Pager [phone]  If 7PM-7AM, please contact night-coverage [email or web address] Password [facility]  12/24/2015, 12:49 PM

## 2015-12-24 NOTE — Progress Notes (Addendum)
12/24/2015 Sara Barnes [id number] 07/03/1940   History of Present Illness:  This is a 75 year old female with long-standing ulcerative colitis versus Crohn's complicated by perirectal abscess and rectal bleeding.  Known well to Dr. Hilarie Fredrickson.  She has had long-standing IBD and moderately active disease both endoscopically and histologically recently. She was hospitalized in 2016. She had been on a prednisone taper which has resulted in symptom improvement.  Was switched to Uceris, which was going to be continued while decision was being made on further escalation of therapy.  She has been hesitant/resistant to this, especially biologics.  She has been intolerant to mesalamine and sulfasalazine.  Returns today with inability to take much by mouth due to abdominal pain/cramping, terrible diarrhea with incontinence, bloody stool continuing mucus, and rectal pain, etc.  Says that she was not on any medication for a couple of weeks while waiting to get the Uceris. She has now been on Uceris for the past 8 days with no improvement in her symptoms. In fact, she thinks she has been getting worse everyday. Is having multiple incontinent stools all day and night. Causes extreme abdominal cramping just with liquids.  Also complains of chills but no objective fever.   Current Medications, Allergies, Past Medical History, Past Surgical History, Family History and Social History were reviewed in [employer] record.   Physical Exam: BP 114/66   Pulse 84   Temp 98.8 F (37.1 C)   Ht 5' 5.75" (1.67 m)   Wt 159 lb (72.1 kg)   BMI 25.86 kg/m  General: Well developed white female in no acute distress; tearful Head: Normocephalic and atraumatic Eyes:  Sclerae anicteric, conjunctiva pink  Ears: Normal auditory acuity Lungs: Clear throughout to auscultation Heart: Regular rate and rhythm Abdomen: Soft, non-distended,  BS present.  Diffuse TTP. Rectal:  No significant external  abnormalities identified. No perianal tenderness, but was exquisitely tender on DRE. Musculoskeletal: Symmetrical with no gross deformities  Extremities: No edema  Neurological: Alert oriented x 4, grossly non-focal Psychological:  Alert and cooperative. Normal mood and affect  Assessment and Recommendations: -75 year old female with long-standing ulcerative colitis versus Crohn's complicated by perirectal abscess and rectal bleeding.  She has had long-standing IBD and moderately active disease both endoscopically and histologically recently. She was hospitalized in 2016. She had been on a prednisone taper which has resulted in symptom improvement.  Was switched to Uceris, which was going to be continued while decision was being made on further escalation of therapy.  She has been hesitant/resistant to this, especially biologics.  She has been intolerant to mesalamine and sulfasalazine.  Returns today with inability to take much by mouth due to abdominal pain/cramping, terrible diarrhea with incontinence, bloody stool continuing mucus, and rectal pain, etc.  I discussed with the patient hospitalization for IV hydration, IV steroids, etc versus placing her on oral prednisone since she has responded well to this in the past.  We have elected for inpatient treatment. She is being accepted to the hospitalist service, and I have discussed with Dr. Cruzita Lederer.  Also have made our inpatient team aware. I think that we should repeat a CT scan of her pelvis to rule out recurrent perirectal abscess. Will need IV steroids and we'll place her on 40 mg Solu-Medrol for now. We'll hold on antibiotics until results of CT scan are obtained. Should consider initiation of Remicade as inpatient.  Addendum: Reviewed and agree with management. Recurrent flare of known IBD. Has been hospitalized,  now multiple times, within the past 1-2 years for IBD. I have expressed strongly my recommendation to escalate therapy to Biologics. She  has been hesitant given her personal history of cancer and her fear of side effects of these medications. Given that she continues to be seriously ill with IBD, my recommendation for initiation of Biologics while hospitalized remains, assuming complicating factor(s) is(are) ruled out. Agree that Remicade would be a good option versus Entyvio. Jerene Bears, MD

## 2015-12-24 NOTE — Progress Notes (Signed)
Aware pt is admitted. Awaiting results of CT scan and labs for further recommendations. Agree with Solumedrol 75m IV qd. Will make further recommendations tomorrow. Will likely discuss starting biologic therapy. Discussed with Dr. NSilverio Decamp (Please see progress note/H&P from JAlonza Bogus PA-C in office today for further info).  JEllouise Newer PA-C

## 2015-12-25 DIAGNOSIS — K51011 Ulcerative (chronic) pancolitis with rectal bleeding: Principal | ICD-10-CM

## 2015-12-25 DIAGNOSIS — K625 Hemorrhage of anus and rectum: Secondary | ICD-10-CM

## 2015-12-25 DIAGNOSIS — K51911 Ulcerative colitis, unspecified with rectal bleeding: Secondary | ICD-10-CM

## 2015-12-25 DIAGNOSIS — R198 Other specified symptoms and signs involving the digestive system and abdomen: Secondary | ICD-10-CM

## 2015-12-25 LAB — GASTROINTESTINAL PANEL BY PCR, STOOL (REPLACES STOOL CULTURE)
ADENOVIRUS F40/41: NOT DETECTED
ASTROVIRUS: NOT DETECTED
CRYPTOSPORIDIUM: NOT DETECTED
CYCLOSPORA CAYETANENSIS: NOT DETECTED
Campylobacter species: NOT DETECTED
E. coli O157: NOT DETECTED
ENTAMOEBA HISTOLYTICA: NOT DETECTED
ENTEROPATHOGENIC E COLI (EPEC): NOT DETECTED
ENTEROTOXIGENIC E COLI (ETEC): NOT DETECTED
Enteroaggregative E coli (EAEC): NOT DETECTED
GIARDIA LAMBLIA: NOT DETECTED
Norovirus GI/GII: NOT DETECTED
Plesimonas shigelloides: NOT DETECTED
Rotavirus A: NOT DETECTED
Salmonella species: NOT DETECTED
Sapovirus (I, II, IV, and V): NOT DETECTED
Shiga like toxin producing E coli (STEC): NOT DETECTED
Shigella/Enteroinvasive E coli (EIEC): NOT DETECTED
VIBRIO CHOLERAE: NOT DETECTED
VIBRIO SPECIES: NOT DETECTED
YERSINIA ENTEROCOLITICA: NOT DETECTED

## 2015-12-25 LAB — BASIC METABOLIC PANEL
ANION GAP: 7 (ref 5–15)
BUN: 6 mg/dL (ref 6–20)
CALCIUM: 8.3 mg/dL — AB (ref 8.9–10.3)
CO2: 21 mmol/L — ABNORMAL LOW (ref 22–32)
CREATININE: 0.63 mg/dL (ref 0.44–1.00)
Chloride: 110 mmol/L (ref 101–111)
GFR calc Af Amer: >60 mL/min (ref >60)
GLUCOSE: 94 mg/dL (ref 65–99)
Potassium: 3.8 mmol/L (ref 3.5–5.1)
Sodium: 138 mmol/L (ref 135–145)

## 2015-12-25 LAB — CBC
HCT: 36.5 % (ref 36.0–46.0)
HEMOGLOBIN: 12.1 g/dL (ref 12.0–15.0)
MCH: 31.2 pg (ref 26.0–34.0)
MCHC: 33.2 g/dL (ref 30.0–36.0)
MCV: 94.1 fL (ref 78.0–100.0)
PLATELETS: 249 10*3/uL (ref 150–400)
RBC: 3.88 MIL/uL (ref 3.87–5.11)
RDW: 12.1 % (ref 11.5–15.5)
WBC: 12.4 10*3/uL — ABNORMAL HIGH (ref 4.0–10.5)

## 2015-12-25 LAB — C DIFFICILE QUICK SCREEN W PCR REFLEX
C DIFFICILE (CDIFF) TOXIN: NEGATIVE
C DIFFICLE (CDIFF) ANTIGEN: NEGATIVE
C Diff interpretation: NOT DETECTED

## 2015-12-25 LAB — C-REACTIVE PROTEIN: CRP: 8.5 mg/dL — ABNORMAL HIGH (ref <1.0)

## 2015-12-25 MED ORDER — MESALAMINE 4 G RE ENEM
4.0000 g | ENEMA | Freq: Every day | RECTAL | Status: DC
  Administered 2015-12-25 – 2015-12-26 (×2): 4 g via RECTAL
  Filled 2015-12-25 (×2): qty 60

## 2015-12-25 MED ORDER — MORPHINE SULFATE (PF) 2 MG/ML IV SOLN
1.0000 mg | INTRAVENOUS | Status: DC | PRN
  Filled 2015-12-25: qty 1

## 2015-12-25 MED ORDER — HYDROCORTISONE ACETATE 25 MG RE SUPP
25.0000 mg | Freq: Every day | RECTAL | Status: DC
  Administered 2015-12-26 – 2015-12-29 (×2): 25 mg via RECTAL
  Filled 2015-12-25 (×5): qty 1

## 2015-12-25 MED ORDER — POTASSIUM CHLORIDE 10 MEQ/100ML IV SOLN
INTRAVENOUS | Status: AC
  Administered 2015-12-25: 10 meq
  Filled 2015-12-25: qty 100

## 2015-12-25 NOTE — Progress Notes (Signed)
Progress Note   Subjective  Chief Complaint:Ulcerative colitis vs Crohn's   Pt expresses that she has had a decrease in abdominal pain overnight after inititation of IV solumedrol, does continue with multiple loose, bloody stools.  Also complaining of nausea this morning which has improved after recent antiemetic. Pt explains that she wants to "take the next step" in treating her Crohns with a more long-term medicine. She asks the difference between Remicade and Humira. She also tell me that she will need to make sure she can afford these medications long-term before we start any of them.    Objective   Vital signs in last 24 hours: Temp:  [98.2 F (36.8 C)-99.3 F (37.4 C)] 98.2 F (36.8 C) (08/29 0609) Pulse Rate:  [67-84] 67 (08/29 0609) Resp:  [18] 18 (08/29 0609) BP: (114-137)/(66-74) 124/70 (08/29 0609) SpO2:  [96 %-98 %] 98 % (08/29 0609) Weight:  [159 lb (72.1 kg)] 159 lb (72.1 kg) (08/28 1115) Last BM Date: 12/24/15 General:    Caucasian female in NAD Heart:  Regular rate and rhythm; no murmurs Lungs: Respirations even and unlabored, lungs CTA bilaterally Abdomen:  Soft, mild ttp generalized and nondistended. Normal bowel sounds. Extremities:  Without edema. Neurologic:  Alert and oriented,  grossly normal neurologically. Psych:  Cooperative. Normal mood and affect.  Intake/Output from previous day: 08/28 0701 - 08/29 0700 In: 951.3 [I.V.:951.3] Out: -    Lab Results:  Recent Labs  12/24/15 1439 12/25/15 0534  WBC 13.5* 12.4*  HGB 13.1 12.1  HCT 39.3 36.5  PLT 255 249   BMET  Recent Labs  12/24/15 1439 12/25/15 0534  NA 137 138  K 3.0* 3.8  CL 104 110  CO2 26 21*  GLUCOSE 96 94  BUN <5* 6  CREATININE 0.74 0.63  CALCIUM 8.6* 8.3*   LFT  Recent Labs  12/24/15 1439  PROT 6.8  ALBUMIN 3.5  AST 11*  ALT 8*  ALKPHOS 90  BILITOT 0.5   PT/INR No results for input(s): LABPROT, INR in the last 72 hours.  Studies/Results: Ct Abdomen  Pelvis W Contrast  Result Date: 12/25/2015 CLINICAL DATA:  Ulcerative colitis versus Crohn's disease. Abdominal pain. Nausea without vomiting. EXAM: CT ABDOMEN AND PELVIS WITH CONTRAST TECHNIQUE: Multidetector CT imaging of the abdomen and pelvis was performed using the standard protocol following bolus administration of intravenous contrast. CONTRAST:  15m ISOVUE-300 IOPAMIDOL (ISOVUE-300) INJECTION 61% COMPARISON:  CT abdomen/ pelvis 09/13/2015 FINDINGS: Lower chest: No pulmonary nodules. Trace pericardial fluid. Bilateral breast implants are noted. Hepatobiliary: Normal hepatic size and contours without focal liver lesion. No perihepatic ascites. No intra- or extrahepatic biliary dilatation. Normal gallbladder. Pancreas: Normal pancreatic contours and enhancement. No peripancreatic fluid collection or pancreatic ductal dilatation. Spleen: Normal. Adrenals/Urinary Tract: Normal adrenal glands. No hydronephrosis or solid renal mass. Stomach/Bowel: There is wall thickening and mild inflammatory stranding surrounding the colon from the rectum to the hepatic flexure. The appearance of the rectosigmoid colon is unchanged compared to the study of 09/13/2015, but the involvement of the descending colon and transverse colon is new. There is no evidence of small-bowel obstruction. There is no abdominal fluid collection. Vascular/Lymphatic: Normal course and caliber of the major abdominal vessels. No abdominal or pelvic adenopathy. Reproductive: The uterus is surgically absent. Ovaries are not clearly visualized. No free fluid in the pelvis. Musculoskeletal: Multilevel thoracic osteophytosis. No lytic or blastic lesions. The visualized extraperitoneal and extrathoracic soft tissues are normal. Other: No contributory non-categorized findings. IMPRESSION: 1. Wall  thickening and inflammatory stranding of the colon from the hepatic flexure to the rectum, consistent with colitis. The distribution is compatible with the  diagnosis of ulcerative colitis. The involvement of the rectosigmoid colon is unchanged compared to the prior study, but the involvement of the transverse and descending colon is new/worsened. 2. No fluid collection or abscess. Electronically Signed   By: Ulyses Jarred M.D.   On: 12/25/2015 01:43       Assessment / Plan:   Assessment: 1. UC vs Crohn's: Complicated by perirectal abscess and rectal bleeding in the past, patient has had long-standing IBD and moderately active disease both endoscopically and histologically recently. She was hospitalized in 2016. She has been on prednisone taper which resulted in symptom improvement and was switched to Uceris. She has been on this for the past 8 days and is now experiencing worsening of her symptoms including bloody stool, rectal pain, terrible diarrhea and abdominal pain. Patient is intolerant to mesalamine and sulfasalazine treatment. She is willing at this time to proceed with Biologics. CT yesterday shows no signs of abscess, but were concerning for worsening disease.  Plan: 1. Will allow patient to speak with Dr. Silverio Decamp for further clarification of Remicade vs Humira. Discussed briefly the differences and let the patient know that Dr. Hilarie Fredrickson recommended Remicade going forward.  2. Once she has decided on medication, can start the process to figure out how much this will be for the patient. 3. Pt also needs one more Hep B vaccination to complete series 4. Last TB test was in December 2016-negative 5. Continue supportive measures 6. Continue IV Solumedrol 11m qd 7. Will discuss above with Dr. NSilverio Decamp  LOS: 1 day   JLavone NianLShriners Hospitals For Children - Erie 12/25/2015, 9:26 AM  Pager # [phone]

## 2015-12-25 NOTE — Progress Notes (Signed)
PROGRESS NOTE  MARAYA GWILLIAM FAO:[id number] DOB: February 27, 1941 DOA: 12/24/2015 PCP: Ann Held, DO   LOS: 1 day   Brief Narrative: 75 y.o. female with medical history significant of ulcerative colitis versus Crohn's disease, otherwise healthy, presents to the emergency room directly from gastroenterology office with complaints of abdominal pain, concern for flare-up  Assessment & Plan: Active Problems:   Rectal bleeding   Rectal pain   Dehydration   Crohn disease (HCC)   Crohn's vs UC flare  - CT abdomen pelvis favors UC, with colitis with transverse, descending and rectosigmoid colon. No abscesses  - continue IVF - IV Solu-Medrol 40 mg daily per GI recommendations - GI to formally see today    DVT prophylaxis: SCD Code Status: Full Family Communication: no family bedside Disposition Plan: home when ready   Consultants:   GI  Procedures:   none  Antimicrobials:  none   Subjective: - feels improved when compared to last night, less abdominal pain - no chest pain, no dyspnea  Objective: Vitals:   12/24/15 1235 12/24/15 2111 12/25/15 0609  BP: 137/74 135/70 124/70  Pulse: 73 72 67  Resp:   18  Temp: 99.3 F (37.4 C) 99.1 F (37.3 C) 98.2 F (36.8 C)  TempSrc: Oral Oral Oral  SpO2: 96% 96% 98%    Intake/Output Summary (Last 24 hours) at 12/25/15 6962 Last data filed at 12/25/15 0400  Gross per 24 hour  Intake           951.25 ml  Output                0 ml  Net           951.25 ml   There were no vitals filed for this visit.  Examination: Constitutional: NAD Vitals:   12/24/15 1235 12/24/15 2111 12/25/15 0609  BP: 137/74 135/70 124/70  Pulse: 73 72 67  Resp:   18  Temp: 99.3 F (37.4 C) 99.1 F (37.3 C) 98.2 F (36.8 C)  TempSrc: Oral Oral Oral  SpO2: 96% 96% 98%    Respiratory: clear to auscultation bilaterally, no wheezing, no crackles.  Cardiovascular: Regular rate and rhythm, no murmurs / rubs / gallops. No LE edema. 2+ pedal  pulses. No carotid bruits.  Abdomen: tender to palpation, improved. No guarding / rebound   Data Reviewed: I have personally reviewed following labs and imaging studies  CBC:  Recent Labs Lab 12/24/15 1439 12/25/15 0534  WBC 13.5* 12.4*  NEUTROABS 10.9*  --   HGB 13.1 12.1  HCT 39.3 36.5  MCV 96.1 94.1  PLT 255 952   Basic Metabolic Panel:  Recent Labs Lab 12/24/15 1439 12/25/15 0534  NA 137 138  K 3.0* 3.8  CL 104 110  CO2 26 21*  GLUCOSE 96 94  BUN <5* 6  CREATININE 0.74 0.63  CALCIUM 8.6* 8.3*  MG 2.1  --   PHOS 3.5  --    GFR: Estimated Creatinine Clearance: 62.4 mL/min (by C-G formula based on SCr of 0.8 mg/dL). Liver Function Tests:  Recent Labs Lab 12/24/15 1439  AST 11*  ALT 8*  ALKPHOS 90  BILITOT 0.5  PROT 6.8  ALBUMIN 3.5   No results for input(s): LIPASE, AMYLASE in the last 168 hours. No results for input(s): AMMONIA in the last 168 hours. Coagulation Profile: No results for input(s): INR, PROTIME in the last 168 hours. Cardiac Enzymes: No results for input(s): CKTOTAL, CKMB, CKMBINDEX, TROPONINI in the last  168 hours. BNP (last 3 results) No results for input(s): PROBNP in the last 8760 hours. HbA1C: No results for input(s): HGBA1C in the last 72 hours. CBG: No results for input(s): GLUCAP in the last 168 hours. Lipid Profile: No results for input(s): CHOL, HDL, LDLCALC, TRIG, CHOLHDL, LDLDIRECT in the last 72 hours. Thyroid Function Tests: No results for input(s): TSH, T4TOTAL, FREET4, T3FREE, THYROIDAB in the last 72 hours. Anemia Panel: No results for input(s): VITAMINB12, FOLATE, FERRITIN, TIBC, IRON, RETICCTPCT in the last 72 hours. Urine analysis:    Component Value Date/Time   COLORURINE YELLOW 04/19/2015 0838   APPEARANCEUR CLOUDY (A) 04/19/2015 0838   LABSPEC 1.013 04/19/2015 0838   PHURINE 6.0 04/19/2015 0838   GLUCOSEU NEGATIVE 04/19/2015 0838   HGBUR MODERATE (A) 04/19/2015 0838   HGBUR small 12/13/2009 0904    BILIRUBINUR NEGATIVE 04/19/2015 0838   BILIRUBINUR ne 01/30/2015 1543   KETONESUR 40 (A) 04/19/2015 0838   PROTEINUR NEGATIVE 04/19/2015 0838   UROBILINOGEN 0.2 01/30/2015 1543   UROBILINOGEN 0.2 09/25/2013 1350   NITRITE NEGATIVE 04/19/2015 0838   LEUKOCYTESUR TRACE (A) 04/19/2015 0838   Sepsis Labs: Invalid input(s): PROCALCITONIN, LACTICIDVEN  No results found for this or any previous visit (from the past 240 hour(s)).    Radiology Studies: Ct Abdomen Pelvis W Contrast  Result Date: 12/25/2015 CLINICAL DATA:  Ulcerative colitis versus Crohn's disease. Abdominal pain. Nausea without vomiting. EXAM: CT ABDOMEN AND PELVIS WITH CONTRAST TECHNIQUE: Multidetector CT imaging of the abdomen and pelvis was performed using the standard protocol following bolus administration of intravenous contrast. CONTRAST:  128m ISOVUE-300 IOPAMIDOL (ISOVUE-300) INJECTION 61% COMPARISON:  CT abdomen/ pelvis 09/13/2015 FINDINGS: Lower chest: No pulmonary nodules. Trace pericardial fluid. Bilateral breast implants are noted. Hepatobiliary: Normal hepatic size and contours without focal liver lesion. No perihepatic ascites. No intra- or extrahepatic biliary dilatation. Normal gallbladder. Pancreas: Normal pancreatic contours and enhancement. No peripancreatic fluid collection or pancreatic ductal dilatation. Spleen: Normal. Adrenals/Urinary Tract: Normal adrenal glands. No hydronephrosis or solid renal mass. Stomach/Bowel: There is wall thickening and mild inflammatory stranding surrounding the colon from the rectum to the hepatic flexure. The appearance of the rectosigmoid colon is unchanged compared to the study of 09/13/2015, but the involvement of the descending colon and transverse colon is new. There is no evidence of small-bowel obstruction. There is no abdominal fluid collection. Vascular/Lymphatic: Normal course and caliber of the major abdominal vessels. No abdominal or pelvic adenopathy. Reproductive: The  uterus is surgically absent. Ovaries are not clearly visualized. No free fluid in the pelvis. Musculoskeletal: Multilevel thoracic osteophytosis. No lytic or blastic lesions. The visualized extraperitoneal and extrathoracic soft tissues are normal. Other: No contributory non-categorized findings. IMPRESSION: 1. Wall thickening and inflammatory stranding of the colon from the hepatic flexure to the rectum, consistent with colitis. The distribution is compatible with the diagnosis of ulcerative colitis. The involvement of the rectosigmoid colon is unchanged compared to the prior study, but the involvement of the transverse and descending colon is new/worsened. 2. No fluid collection or abscess. Electronically Signed   By: KUlyses JarredM.D.   On: 12/25/2015 01:43     Scheduled Meds: . chlorhexidine  15 mL Mouth/Throat BID  . cyanocobalamin  1,000 mcg Intramuscular Q30 days  . FLUoxetine  20 mg Oral Daily  . fluticasone  2 spray Each Nare Daily  . methylPREDNISolone (SOLU-MEDROL) injection  40 mg Intravenous Q24H   Continuous Infusions: . sodium chloride 75 mL/hr at 12/25/15 0[place]  MD, PhD [facility] Pager [phone]  If 7PM-7AM, please contact night-coverage [email or web address] Password [facility] 12/25/2015, 8:28 AM

## 2015-12-26 DIAGNOSIS — R198 Other specified symptoms and signs involving the digestive system and abdomen: Secondary | ICD-10-CM

## 2015-12-26 DIAGNOSIS — K6289 Other specified diseases of anus and rectum: Secondary | ICD-10-CM

## 2015-12-26 DIAGNOSIS — K51011 Ulcerative (chronic) pancolitis with rectal bleeding: Secondary | ICD-10-CM

## 2015-12-26 DIAGNOSIS — K5000 Crohn's disease of small intestine without complications: Secondary | ICD-10-CM

## 2015-12-26 NOTE — Progress Notes (Signed)
Progress Note   Subjective  Chief Complaint:Ulcerative Colitis  Pt reports that she is "tired of laying around and doing nothing" and seems somewhat depressed, she has continue with >10 urgent loose BM overnight, currently expressing that this is mostly blood and not as much stool, she requests full liquid diet today, wanting something other than jello which seems to make her run to the bathroom. She continus with generalized abdominal pain.   Pt does spend some time explaining that she did not receive good care from nursing staff overnight.    Objective   Vital signs in last 24 hours: Temp:  [97.8 F (36.6 C)-98.9 F (37.2 C)] 98.7 F (37.1 C) (08/30 0458) Pulse Rate:  [61-64] 62 (08/30 0458) Resp:  [18-20] 18 (08/30 0458) BP: (127-137)/(66-75) 137/75 (08/30 0458) SpO2:  [96 %-98 %] 97 % (08/30 0458) Last BM Date: 12/25/15 General:    Caucasian female in NAD Heart:  Regular rate and rhythm; no murmurs Lungs: Respirations even and unlabored, lungs CTA bilaterally Abdomen:  Soft, mild ttp generalized and nondistended. Normal bowel sounds. Extremities:  Without edema. Neurologic:  Alert and oriented,  grossly normal neurologically. Psych:  Cooperative. Normal mood and affect.  Intake/Output from previous day: 08/29 0701 - 08/30 0700 In: 618 [P.O.:60; I.V.:558] Out: -   Lab Results:  Recent Labs  12/24/15 1439 12/25/15 0534  WBC 13.5* 12.4*  HGB 13.1 12.1  HCT 39.3 36.5  PLT 255 249   BMET  Recent Labs  12/24/15 1439 12/25/15 0534  NA 137 138  K 3.0* 3.8  CL 104 110  CO2 26 21*  GLUCOSE 96 94  BUN <5* 6  CREATININE 0.74 0.63  CALCIUM 8.6* 8.3*   LFT  Recent Labs  12/24/15 1439  PROT 6.8  ALBUMIN 3.5  AST 11*  ALT 8*  ALKPHOS 90  BILITOT 0.5   PT/INR No results for input(s): LABPROT, INR in the last 72 hours.  Studies/Results: Ct Abdomen Pelvis W Contrast  Result Date: 12/25/2015 CLINICAL DATA:  Ulcerative colitis versus Crohn's disease.  Abdominal pain. Nausea without vomiting. EXAM: CT ABDOMEN AND PELVIS WITH CONTRAST TECHNIQUE: Multidetector CT imaging of the abdomen and pelvis was performed using the standard protocol following bolus administration of intravenous contrast. CONTRAST:  17m ISOVUE-300 IOPAMIDOL (ISOVUE-300) INJECTION 61% COMPARISON:  CT abdomen/ pelvis 09/13/2015 FINDINGS: Lower chest: No pulmonary nodules. Trace pericardial fluid. Bilateral breast implants are noted. Hepatobiliary: Normal hepatic size and contours without focal liver lesion. No perihepatic ascites. No intra- or extrahepatic biliary dilatation. Normal gallbladder. Pancreas: Normal pancreatic contours and enhancement. No peripancreatic fluid collection or pancreatic ductal dilatation. Spleen: Normal. Adrenals/Urinary Tract: Normal adrenal glands. No hydronephrosis or solid renal mass. Stomach/Bowel: There is wall thickening and mild inflammatory stranding surrounding the colon from the rectum to the hepatic flexure. The appearance of the rectosigmoid colon is unchanged compared to the study of 09/13/2015, but the involvement of the descending colon and transverse colon is new. There is no evidence of small-bowel obstruction. There is no abdominal fluid collection. Vascular/Lymphatic: Normal course and caliber of the major abdominal vessels. No abdominal or pelvic adenopathy. Reproductive: The uterus is surgically absent. Ovaries are not clearly visualized. No free fluid in the pelvis. Musculoskeletal: Multilevel thoracic osteophytosis. No lytic or blastic lesions. The visualized extraperitoneal and extrathoracic soft tissues are normal. Other: No contributory non-categorized findings. IMPRESSION: 1. Wall thickening and inflammatory stranding of the colon from the hepatic flexure to the rectum, consistent with colitis. The distribution is  compatible with the diagnosis of ulcerative colitis. The involvement of the rectosigmoid colon is unchanged compared to the prior  study, but the involvement of the transverse and descending colon is new/worsened. 2. No fluid collection or abscess. Electronically Signed   By: Ulyses Jarred M.D.   On: 12/25/2015 01:43       Assessment / Plan:    Assessment: 1. UC: Complicated by perirectal abscess and rectal bleeding in the past, patient has had long-standing IBD and moderately active disease both endoscopically and histologically recently. She was hospitalized in 2016. She has been on prednisone taper which resulted in symptom improvement and was switched to Uceris. She has been on this for the past 8 days and is now experiencing worsening of her symptoms including bloody stool, rectal pain, terrible diarrhea and abdominal pain. Patient is intolerant to mesalamine and sulfasalazine treatment. She is willing at this time to proceed with Biologics. CT yesterday shows no signs of abscess, but were concerning for worsening disease. We are awaiting estimate of cost for Remicade outpatient before initiating  Plan: 1. Continue IV Solumedrol 7m qd 2. Continue Rowasa enemas at bedtime and Anusol suppositories qam 3. Awaiting outpatient coordination/potential cost for Remicade from our office, if pt can afford will proceed with first dose while she is here 4. Advanced to full liquid diet 5. Discussed above with Dr. NSilverio Decamp Thank you for your kind consultation, we will continue to follow    LOS: 2 days   JLavone NianLMurray [facility] 12/26/2015, 9:19 AM  Pager # [phone]

## 2015-12-26 NOTE — Progress Notes (Signed)
PROGRESS NOTE  Sara Barnes MGQ:[id number] DOB: Jan 05, 1941 DOA: 12/24/2015 PCP: Ann Held, DO   LOS: 2 days   Brief Narrative: 75 y.o. female with medical history significant of ulcerative colitis versus Crohn's disease, otherwise healthy, presents to the emergency room directly from gastroenterology office with complaints of abdominal pain, concern for flare-up  Assessment & Plan: Active Problems:   Rectal bleeding   Rectal pain   Dehydration   Crohn disease (HCC)   Tenesmus   Crohn's vs UC flare  - CT abdomen pelvis favors UC, with colitis with transverse, descending and rectosigmoid colon. No abscesses  - continue IVF - IV Solu-Medrol 40 mg daily per GI recommendations - GI on board and assisting with management.   DVT prophylaxis: SCD Code Status: Full Family Communication: no family bedside Disposition Plan: home when ready   Consultants:   GI  Procedures:   none  Antimicrobials:  none   Subjective: Pt has no new complaints today.   Objective: Vitals:   12/25/15 1400 12/25/15 2100 12/26/15 0458 12/26/15 1534  BP: 129/66 127/73 137/75 140/81  Pulse: 64 61 62 66  Resp: 18 20 18 18   Temp: 98.4 F (36.9 C) 98.9 F (37.2 C) 98.7 F (37.1 C) 98.3 F (36.8 C)  TempSrc: Oral Oral Oral Oral  SpO2: 97% 96% 97% 97%    Intake/Output Summary (Last 24 hours) at 12/26/15 1614 Last data filed at 12/26/15 1525  Gross per 24 hour  Intake          1369.25 ml  Output                0 ml  Net          1369.25 ml   There were no vitals filed for this visit.  Examination: Constitutional: NAD, alert and awake Vitals:   12/25/15 1400 12/25/15 2100 12/26/15 0458 12/26/15 1534  BP: 129/66 127/73 137/75 140/81  Pulse: 64 61 62 66  Resp: 18 20 18 18   Temp: 98.4 F (36.9 C) 98.9 F (37.2 C) 98.7 F (37.1 C) 98.3 F (36.8 C)  TempSrc: Oral Oral Oral Oral  SpO2: 97% 96% 97% 97%    Respiratory: clear to auscultation bilaterally, no wheezing, no  crackles.  Cardiovascular: Regular rate and rhythm, no murmurs / rubs / gallops. No LE edema. 2+ pedal pulses. No carotid bruits.  Abdomen: tender to palpation, improved. No guarding / rebound   Data Reviewed: I have personally reviewed following labs and imaging studies  CBC:  Recent Labs Lab 12/24/15 1439 12/25/15 0534  WBC 13.5* 12.4*  NEUTROABS 10.9*  --   HGB 13.1 12.1  HCT 39.3 36.5  MCV 96.1 94.1  PLT 255 267   Basic Metabolic Panel:  Recent Labs Lab 12/24/15 1439 12/25/15 0534  NA 137 138  K 3.0* 3.8  CL 104 110  CO2 26 21*  GLUCOSE 96 94  BUN <5* 6  CREATININE 0.74 0.63  CALCIUM 8.6* 8.3*  MG 2.1  --   PHOS 3.5  --    GFR: Estimated Creatinine Clearance: 62.4 mL/min (by C-G formula based on SCr of 0.8 mg/dL). Liver Function Tests:  Recent Labs Lab 12/24/15 1439  AST 11*  ALT 8*  ALKPHOS 90  BILITOT 0.5  PROT 6.8  ALBUMIN 3.5   No results for input(s): LIPASE, AMYLASE in the last 168 hours. No results for input(s): AMMONIA in the last 168 hours. Coagulation Profile: No results for input(s): INR, PROTIME in  the last 168 hours. Cardiac Enzymes: No results for input(s): CKTOTAL, CKMB, CKMBINDEX, TROPONINI in the last 168 hours. BNP (last 3 results) No results for input(s): PROBNP in the last 8760 hours. HbA1C: No results for input(s): HGBA1C in the last 72 hours. CBG: No results for input(s): GLUCAP in the last 168 hours. Lipid Profile: No results for input(s): CHOL, HDL, LDLCALC, TRIG, CHOLHDL, LDLDIRECT in the last 72 hours. Thyroid Function Tests: No results for input(s): TSH, T4TOTAL, FREET4, T3FREE, THYROIDAB in the last 72 hours. Anemia Panel: No results for input(s): VITAMINB12, FOLATE, FERRITIN, TIBC, IRON, RETICCTPCT in the last 72 hours. Urine analysis:    Component Value Date/Time   COLORURINE YELLOW 04/19/2015 0838   APPEARANCEUR CLOUDY (A) 04/19/2015 0838   LABSPEC 1.013 04/19/2015 0838   PHURINE 6.0 04/19/2015 0838    GLUCOSEU NEGATIVE 04/19/2015 0838   HGBUR MODERATE (A) 04/19/2015 0838   HGBUR small 12/13/2009 0904   BILIRUBINUR NEGATIVE 04/19/2015 0838   BILIRUBINUR ne 01/30/2015 1543   KETONESUR 40 (A) 04/19/2015 0838   PROTEINUR NEGATIVE 04/19/2015 0838   UROBILINOGEN 0.2 01/30/2015 1543   UROBILINOGEN 0.2 09/25/2013 1350   NITRITE NEGATIVE 04/19/2015 0838   LEUKOCYTESUR TRACE (A) 04/19/2015 0838   Sepsis Labs: Invalid input(s): PROCALCITONIN, LACTICIDVEN  Recent Results (from the past 240 hour(s))  C difficile quick scan w PCR reflex     Status: None   Collection Time: 12/25/15 12:42 PM  Result Value Ref Range Status   C Diff antigen NEGATIVE NEGATIVE Final   C Diff toxin NEGATIVE NEGATIVE Final   C Diff interpretation No C. difficile detected.  Final  Gastrointestinal Panel by PCR , Stool     Status: None   Collection Time: 12/25/15 12:42 PM  Result Value Ref Range Status   Campylobacter species NOT DETECTED NOT DETECTED Final   Plesimonas shigelloides NOT DETECTED NOT DETECTED Final   Salmonella species NOT DETECTED NOT DETECTED Final   Yersinia enterocolitica NOT DETECTED NOT DETECTED Final   Vibrio species NOT DETECTED NOT DETECTED Final   Vibrio cholerae NOT DETECTED NOT DETECTED Final   Enteroaggregative E coli (EAEC) NOT DETECTED NOT DETECTED Final   Enteropathogenic E coli (EPEC) NOT DETECTED NOT DETECTED Final   Enterotoxigenic E coli (ETEC) NOT DETECTED NOT DETECTED Final   Shiga like toxin producing E coli (STEC) NOT DETECTED NOT DETECTED Final   E. coli O157 NOT DETECTED NOT DETECTED Final   Shigella/Enteroinvasive E coli (EIEC) NOT DETECTED NOT DETECTED Final   Cryptosporidium NOT DETECTED NOT DETECTED Final   Cyclospora cayetanensis NOT DETECTED NOT DETECTED Final   Entamoeba histolytica NOT DETECTED NOT DETECTED Final   Giardia lamblia NOT DETECTED NOT DETECTED Final   Adenovirus F40/41 NOT DETECTED NOT DETECTED Final   Astrovirus NOT DETECTED NOT DETECTED Final    Norovirus GI/GII NOT DETECTED NOT DETECTED Final   Rotavirus A NOT DETECTED NOT DETECTED Final   Sapovirus (I, II, IV, and V) NOT DETECTED NOT DETECTED Final      Radiology Studies: Ct Abdomen Pelvis W Contrast  Result Date: 12/25/2015 CLINICAL DATA:  Ulcerative colitis versus Crohn's disease. Abdominal pain. Nausea without vomiting. EXAM: CT ABDOMEN AND PELVIS WITH CONTRAST TECHNIQUE: Multidetector CT imaging of the abdomen and pelvis was performed using the standard protocol following bolus administration of intravenous contrast. CONTRAST:  166m ISOVUE-300 IOPAMIDOL (ISOVUE-300) INJECTION 61% COMPARISON:  CT abdomen/ pelvis 09/13/2015 FINDINGS: Lower chest: No pulmonary nodules. Trace pericardial fluid. Bilateral breast implants are noted. Hepatobiliary: Normal hepatic size  and contours without focal liver lesion. No perihepatic ascites. No intra- or extrahepatic biliary dilatation. Normal gallbladder. Pancreas: Normal pancreatic contours and enhancement. No peripancreatic fluid collection or pancreatic ductal dilatation. Spleen: Normal. Adrenals/Urinary Tract: Normal adrenal glands. No hydronephrosis or solid renal mass. Stomach/Bowel: There is wall thickening and mild inflammatory stranding surrounding the colon from the rectum to the hepatic flexure. The appearance of the rectosigmoid colon is unchanged compared to the study of 09/13/2015, but the involvement of the descending colon and transverse colon is new. There is no evidence of small-bowel obstruction. There is no abdominal fluid collection. Vascular/Lymphatic: Normal course and caliber of the major abdominal vessels. No abdominal or pelvic adenopathy. Reproductive: The uterus is surgically absent. Ovaries are not clearly visualized. No free fluid in the pelvis. Musculoskeletal: Multilevel thoracic osteophytosis. No lytic or blastic lesions. The visualized extraperitoneal and extrathoracic soft tissues are normal. Other: No contributory  non-categorized findings. IMPRESSION: 1. Wall thickening and inflammatory stranding of the colon from the hepatic flexure to the rectum, consistent with colitis. The distribution is compatible with the diagnosis of ulcerative colitis. The involvement of the rectosigmoid colon is unchanged compared to the prior study, but the involvement of the transverse and descending colon is new/worsened. 2. No fluid collection or abscess. Electronically Signed   By: Ulyses Jarred M.D.   On: 12/25/2015 01:43     Scheduled Meds: . chlorhexidine  15 mL Mouth/Throat BID  . cyanocobalamin  1,000 mcg Intramuscular Q30 days  . FLUoxetine  20 mg Oral Daily  . fluticasone  2 spray Each Nare Daily  . hydrocortisone  25 mg Rectal Daily  . mesalamine  4 g Rectal QHS  . methylPREDNISolone (SOLU-MEDROL) injection  40 mg Intravenous Q24H   Continuous Infusions: . sodium chloride 75 mL/hr at 12/26/15 1525    Marzetta Board, MD, PhD [facility] Pager [phone]  If 7PM-7AM, please contact night-coverage [email or web address] Password [email or web address] 12/26/2015, 4:14 PM

## 2015-12-27 LAB — CMV DNA BY PCR, QUALITATIVE: CMV DNA QL PCR: NEGATIVE

## 2015-12-27 MED ORDER — PB-HYOSCY-ATROPINE-SCOPOLAMINE 16.2 MG PO TABS
1.0000 | ORAL_TABLET | Freq: Three times a day (TID) | ORAL | Status: DC
  Filled 2015-12-27: qty 1

## 2015-12-27 MED ORDER — PREDNISONE 20 MG PO TABS
40.0000 mg | ORAL_TABLET | Freq: Every day | ORAL | Status: DC
  Administered 2015-12-27 – 2015-12-29 (×3): 40 mg via ORAL
  Filled 2015-12-27 (×3): qty 2

## 2015-12-27 MED ORDER — MESALAMINE 1000 MG RE SUPP
1000.0000 mg | Freq: Every day | RECTAL | Status: DC
  Administered 2015-12-27 – 2015-12-28 (×2): 1000 mg via RECTAL
  Filled 2015-12-27 (×2): qty 1

## 2015-12-27 MED ORDER — PB-HYOSCY-ATROPINE-SCOPOLAMINE 16.2 MG/5ML PO ELIX
5.0000 mL | ORAL_SOLUTION | Freq: Three times a day (TID) | ORAL | Status: DC
  Administered 2015-12-27 – 2015-12-29 (×5): 16.2 mg via ORAL
  Filled 2015-12-27 (×7): qty 5

## 2015-12-27 NOTE — Progress Notes (Signed)
PROGRESS NOTE  Sara Barnes KZL:[id number] DOB: May 06, 1940 DOA: 12/24/2015 PCP: Ann Held, DO   LOS: 3 days   Brief Narrative: 75 y.o. female with medical history significant of ulcerative colitis versus Crohn's disease, otherwise healthy, presents to the emergency room directly from gastroenterology office with complaints of abdominal pain, concern for flare-up  Assessment & Plan: Active Problems:   Rectal bleeding   Rectal pain   Dehydration   Crohn disease (HCC)   Tenesmus   Ulcerative pancolitis with rectal bleeding (HCC)   Crohn's vs UC flare  - CT abdomen pelvis favors UC, with colitis with transverse, descending and rectosigmoid colon. No abscesses  - continue IVF - GI on board and assisting with management. - continue supportive therapy   DVT prophylaxis: SCD Code Status: Full Family Communication: no family bedside Disposition Plan: home when GI clears for discharge.  Consultants:   GI  Procedures:   none  Antimicrobials:  none   Subjective: Pt has no new complaints today. No acute issues reported overnight.  Objective: Vitals:   12/26/15 1534 12/26/15 2019 12/26/15 2020 12/27/15 0536  BP: 140/81 134/76  (!) 141/77  Pulse: 66 70  61  Resp: 18 18  16   Temp: 98.3 F (36.8 C) 97.8 F (36.6 C)  97.8 F (36.6 C)  TempSrc: Oral Oral  Oral  SpO2: 97% 97%  98%  Weight:   77 kg (169 lb 12.1 oz)   Height:   5' 5"  (1.651 m)     Intake/Output Summary (Last 24 hours) at 12/27/15 1209 Last data filed at 12/27/15 0300  Gross per 24 hour  Intake             2040 ml  Output                0 ml  Net             2040 ml   Filed Weights   12/26/15 2020  Weight: 77 kg (169 lb 12.1 oz)    Examination: Constitutional: NAD, alert and awake Vitals:   12/26/15 1534 12/26/15 2019 12/26/15 2020 12/27/15 0536  BP: 140/81 134/76  (!) 141/77  Pulse: 66 70  61  Resp: 18 18  16   Temp: 98.3 F (36.8 C) 97.8 F (36.6 C)  97.8 F (36.6 C)    TempSrc: Oral Oral  Oral  SpO2: 97% 97%  98%  Weight:   77 kg (169 lb 12.1 oz)   Height:   5' 5"  (1.651 m)     Respiratory: clear to auscultation bilaterally, no wheezing, no crackles.  Cardiovascular: Regular rate and rhythm, no murmurs / rubs / gallops. No LE edema. 2+ pedal pulses. No carotid bruits.  Abdomen: tender to palpation, improved. No guarding / rebound   Data Reviewed: I have personally reviewed following labs and imaging studies  CBC:  Recent Labs Lab 12/24/15 1439 12/25/15 0534  WBC 13.5* 12.4*  NEUTROABS 10.9*  --   HGB 13.1 12.1  HCT 39.3 36.5  MCV 96.1 94.1  PLT 255 390   Basic Metabolic Panel:  Recent Labs Lab 12/24/15 1439 12/25/15 0534  NA 137 138  K 3.0* 3.8  CL 104 110  CO2 26 21*  GLUCOSE 96 94  BUN <5* 6  CREATININE 0.74 0.63  CALCIUM 8.6* 8.3*  MG 2.1  --   PHOS 3.5  --    GFR: Estimated Creatinine Clearance: 63.3 mL/min (by C-G formula based on SCr of 0.8  mg/dL). Liver Function Tests:  Recent Labs Lab 12/24/15 1439  AST 11*  ALT 8*  ALKPHOS 90  BILITOT 0.5  PROT 6.8  ALBUMIN 3.5   No results for input(s): LIPASE, AMYLASE in the last 168 hours. No results for input(s): AMMONIA in the last 168 hours. Coagulation Profile: No results for input(s): INR, PROTIME in the last 168 hours. Cardiac Enzymes: No results for input(s): CKTOTAL, CKMB, CKMBINDEX, TROPONINI in the last 168 hours. BNP (last 3 results) No results for input(s): PROBNP in the last 8760 hours. HbA1C: No results for input(s): HGBA1C in the last 72 hours. CBG: No results for input(s): GLUCAP in the last 168 hours. Lipid Profile: No results for input(s): CHOL, HDL, LDLCALC, TRIG, CHOLHDL, LDLDIRECT in the last 72 hours. Thyroid Function Tests: No results for input(s): TSH, T4TOTAL, FREET4, T3FREE, THYROIDAB in the last 72 hours. Anemia Panel: No results for input(s): VITAMINB12, FOLATE, FERRITIN, TIBC, IRON, RETICCTPCT in the last 72 hours. Urine  analysis:    Component Value Date/Time   COLORURINE YELLOW 04/19/2015 0838   APPEARANCEUR CLOUDY (A) 04/19/2015 0838   LABSPEC 1.013 04/19/2015 0838   PHURINE 6.0 04/19/2015 0838   GLUCOSEU NEGATIVE 04/19/2015 0838   HGBUR MODERATE (A) 04/19/2015 0838   HGBUR small 12/13/2009 0904   BILIRUBINUR NEGATIVE 04/19/2015 0838   BILIRUBINUR ne 01/30/2015 1543   KETONESUR 40 (A) 04/19/2015 0838   PROTEINUR NEGATIVE 04/19/2015 0838   UROBILINOGEN 0.2 01/30/2015 1543   UROBILINOGEN 0.2 09/25/2013 1350   NITRITE NEGATIVE 04/19/2015 0838   LEUKOCYTESUR TRACE (A) 04/19/2015 0838   Sepsis Labs: Invalid input(s): PROCALCITONIN, LACTICIDVEN  Recent Results (from the past 240 hour(s))  C difficile quick scan w PCR reflex     Status: None   Collection Time: 12/25/15 12:42 PM  Result Value Ref Range Status   C Diff antigen NEGATIVE NEGATIVE Final   C Diff toxin NEGATIVE NEGATIVE Final   C Diff interpretation No C. difficile detected.  Final  Gastrointestinal Panel by PCR , Stool     Status: None   Collection Time: 12/25/15 12:42 PM  Result Value Ref Range Status   Campylobacter species NOT DETECTED NOT DETECTED Final   Plesimonas shigelloides NOT DETECTED NOT DETECTED Final   Salmonella species NOT DETECTED NOT DETECTED Final   Yersinia enterocolitica NOT DETECTED NOT DETECTED Final   Vibrio species NOT DETECTED NOT DETECTED Final   Vibrio cholerae NOT DETECTED NOT DETECTED Final   Enteroaggregative E coli (EAEC) NOT DETECTED NOT DETECTED Final   Enteropathogenic E coli (EPEC) NOT DETECTED NOT DETECTED Final   Enterotoxigenic E coli (ETEC) NOT DETECTED NOT DETECTED Final   Shiga like toxin producing E coli (STEC) NOT DETECTED NOT DETECTED Final   E. coli O157 NOT DETECTED NOT DETECTED Final   Shigella/Enteroinvasive E coli (EIEC) NOT DETECTED NOT DETECTED Final   Cryptosporidium NOT DETECTED NOT DETECTED Final   Cyclospora cayetanensis NOT DETECTED NOT DETECTED Final   Entamoeba histolytica  NOT DETECTED NOT DETECTED Final   Giardia lamblia NOT DETECTED NOT DETECTED Final   Adenovirus F40/41 NOT DETECTED NOT DETECTED Final   Astrovirus NOT DETECTED NOT DETECTED Final   Norovirus GI/GII NOT DETECTED NOT DETECTED Final   Rotavirus A NOT DETECTED NOT DETECTED Final   Sapovirus (I, II, IV, and V) NOT DETECTED NOT DETECTED Final      Radiology Studies: No results found.   Scheduled Meds: . chlorhexidine  15 mL Mouth/Throat BID  . cyanocobalamin  1,000 mcg Intramuscular Q30  days  . FLUoxetine  20 mg Oral Daily  . fluticasone  2 spray Each Nare Daily  . hydrocortisone  25 mg Rectal Daily  . mesalamine  4 g Rectal QHS  . predniSONE  40 mg Oral Q breakfast   Continuous Infusions: . sodium chloride 75 mL/hr at 12/27/15 0706    Marzetta Board, MD, PhD [facility] Pager [phone]  If 7PM-7AM, please contact night-coverage [email or web address] Password [facility] 12/27/2015, 12:09 PM

## 2015-12-27 NOTE — Progress Notes (Signed)
    Progress Note   Subjective  Chief Complaint: Ulcerative colitis  Pt reports feeling "somewhat" better this morning, still with frequent urgent BM and hematochezia, but less abdominal cramping. She admits that she did sleep "harder" than she has the past few night and is enjoying her regular diet.    Objective   Vital signs in last 24 hours: Temp:  [97.8 F (36.6 C)-98.3 F (36.8 C)] 97.8 F (36.6 C) (08/31 0536) Pulse Rate:  [61-70] 61 (08/31 0536) Resp:  [16-18] 16 (08/31 0536) BP: (134-141)/(76-81) 141/77 (08/31 0536) SpO2:  [97 %-98 %] 98 % (08/31 0536) Weight:  [169 lb 12.1 oz (77 kg)] 169 lb 12.1 oz (77 kg) (08/30 2020) Last BM Date: 12/26/15 General: Caucasian female in NAD Heart:  Regular rate and rhythm; no murmurs Lungs: Respirations even and unlabored, lungs CTA bilaterally Abdomen:  Soft, nontender and nondistended. Normal bowel sounds. Extremities:  Without edema. Neurologic:  Alert and oriented,  grossly normal neurologically. Psych:  Cooperative. Normal mood and affect.  Intake/Output from previous day: 08/30 0701 - 08/31 0700 In: 2160 [P.O.:660; I.V.:1500] Out: -   Lab Results:  Recent Labs  12/24/15 1439 12/25/15 0534  WBC 13.5* 12.4*  HGB 13.1 12.1  HCT 39.3 36.5  PLT 255 249   BMET  Recent Labs  12/24/15 1439 12/25/15 0534  NA 137 138  K 3.0* 3.8  CL 104 110  CO2 26 21*  GLUCOSE 96 94  BUN <5* 6  CREATININE 0.74 0.63  CALCIUM 8.6* 8.3*   LFT  Recent Labs  12/24/15 1439  PROT 6.8  ALBUMIN 3.5  AST 11*  ALT 8*  ALKPHOS 90  BILITOT 0.5   PT/INR No results for input(s): LABPROT, INR in the last 72 hours.  Studies/Results: No results found.     Assessment / Plan:    Assessment: 1. [id number] by perirectal abscess and rectal bleeding in the past, patient has had long-standing IBD and moderately active disease both endoscopically and histologically recently. She was hospitalized in 2016. She has been on prednisone  taper which resulted in symptom improvement and was switched to Uceris. She has been on this for the past 8 days and is now experiencing worsening of her symptoms including bloody stool, rectal pain, terrible diarrhea and abdominal pain. Patient is intolerant to mesalamine and sulfasalazine treatment. She is willing at this time to proceed with Biologics. CT  shows no signs of abscess, but were concerning for worsening disease. Discussed estimated cost with patient and she is unable to pay the 20% that it would take to start Remicade. We will print out patient assistance papaers and give these to the patient so that we can try to get her discounted medication.  Plan: 1. Continue IV Solumedrol 66m qd 2. Continue Rowasa enemas at bedtime and Anusol suppositories qam 3. Continue regular diet 4. Will obtain patient assistance papers for the patient, will likely start Remicade as outpatient after cost is worked out 5. Will discuss above with Dr. NSilverio Decamp Thank you for your kind consultation, we will continue to follow   LOS: 3 days   JLavone NianLBaptist Surgery And [facility] 12/27/2015, 8:54 AM  Pager # [phone]

## 2015-12-28 DIAGNOSIS — K589 Irritable bowel syndrome without diarrhea: Secondary | ICD-10-CM

## 2015-12-28 LAB — CBC WITH DIFFERENTIAL/PLATELET
Basophils Absolute: 0 10*3/uL (ref 0.0–0.1)
Basophils Relative: 0 %
EOS ABS: 0 10*3/uL (ref 0.0–0.7)
EOS PCT: 0 %
HCT: 34.5 % — ABNORMAL LOW (ref 36.0–46.0)
HEMOGLOBIN: 11.5 g/dL — AB (ref 12.0–15.0)
LYMPHS ABS: 1.5 10*3/uL (ref 0.7–4.0)
Lymphocytes Relative: 17 %
MCH: 31.9 pg (ref 26.0–34.0)
MCHC: 33.3 g/dL (ref 30.0–36.0)
MCV: 95.6 fL (ref 78.0–100.0)
MONO ABS: 0.8 10*3/uL (ref 0.1–1.0)
MONOS PCT: 9 %
Neutro Abs: 6.5 10*3/uL (ref 1.7–7.7)
Neutrophils Relative %: 74 %
PLATELETS: 267 10*3/uL (ref 150–400)
RBC: 3.61 MIL/uL — ABNORMAL LOW (ref 3.87–5.11)
RDW: 12.3 % (ref 11.5–15.5)
WBC: 8.9 10*3/uL (ref 4.0–10.5)

## 2015-12-28 LAB — COMPREHENSIVE METABOLIC PANEL
ALK PHOS: 71 U/L (ref 38–126)
ALT: 10 U/L — ABNORMAL LOW (ref 14–54)
ANION GAP: 6 (ref 5–15)
AST: 15 U/L (ref 15–41)
Albumin: 2.9 g/dL — ABNORMAL LOW (ref 3.5–5.0)
BUN: <5 mg/dL — ABNORMAL LOW (ref 6–20)
CALCIUM: 8.3 mg/dL — AB (ref 8.9–10.3)
CO2: 21 mmol/L — AB (ref 22–32)
Chloride: 111 mmol/L (ref 101–111)
Creatinine, Ser: 0.81 mg/dL (ref 0.44–1.00)
GFR calc non Af Amer: >60 mL/min (ref >60)
Glucose, Bld: 142 mg/dL — ABNORMAL HIGH (ref 65–99)
Potassium: 2.8 mmol/L — ABNORMAL LOW (ref 3.5–5.1)
SODIUM: 138 mmol/L (ref 135–145)
Total Bilirubin: 0.2 mg/dL — ABNORMAL LOW (ref 0.3–1.2)
Total Protein: 5.8 g/dL — ABNORMAL LOW (ref 6.5–8.1)

## 2015-12-28 NOTE — Progress Notes (Signed)
PROGRESS NOTE  Sara Barnes [id number]:[id number] DOB: 15-Aug-1940 DOA: 12/24/2015 PCP: Ann Held, DO   LOS: 4 days   Brief Narrative: 75 y.o. female with medical history significant of ulcerative colitis versus Crohn's disease, otherwise healthy, presents to the emergency room directly from gastroenterology office with complaints of abdominal pain, concern for flare-up  Assessment & Plan: Active Problems:   Rectal bleeding   Rectal pain   Dehydration   Crohn disease (HCC)   Tenesmus   Ulcerative pancolitis with rectal bleeding (HCC)   Crohn's vs UC flare  - CT abdomen pelvis favors UC, with colitis with transverse, descending and rectosigmoid colon. No abscesses  - continue IVF - GI on board and assisting with management. - continue supportive therapy   DVT prophylaxis: SCD Code Status: Full Family Communication: no family bedside Disposition Plan: home when GI clears for discharge.  Consultants:   GI  Procedures:   none  Antimicrobials:  none   Subjective: Pt states she doesn't feel much better today.  Objective: Vitals:   12/27/15 1400 12/27/15 2139 12/28/15 0550 12/28/15 1107  BP: 123/72 131/77 (!) 149/80 (!) 142/76  Pulse: 73 60 (!) 59 66  Resp: 16 16 15 16   Temp: 97.9 F (36.6 C) 98.4 F (36.9 C) 97.9 F (36.6 C) 98 F (36.7 C)  TempSrc: Oral Oral Axillary Oral  SpO2: 95% 97% 97% 98%  Weight:      Height:        Intake/Output Summary (Last 24 hours) at 12/28/15 1214 Last data filed at 12/28/15 0857  Gross per 24 hour  Intake              120 ml  Output                0 ml  Net              120 ml   Filed Weights   12/26/15 2020  Weight: 77 kg (169 lb 12.1 oz)    Examination: Constitutional: NAD, alert and awake Vitals:   12/27/15 1400 12/27/15 2139 12/28/15 0550 12/28/15 1107  BP: 123/72 131/77 (!) 149/80 (!) 142/76  Pulse: 73 60 (!) 59 66  Resp: 16 16 15 16   Temp: 97.9 F (36.6 C) 98.4 F (36.9 C) 97.9 F (36.6 C) 98 F  (36.7 C)  TempSrc: Oral Oral Axillary Oral  SpO2: 95% 97% 97% 98%  Weight:      Height:        Respiratory: clear to auscultation bilaterally, no wheezing, no crackles.  Cardiovascular: Regular rate and rhythm, no murmurs / rubs / gallops. No LE edema. 2+ pedal pulses. No carotid bruits.  Abdomen: tender to palpation, improved. No guarding / rebound   Data Reviewed: I have personally reviewed following labs and imaging studies  CBC:  Recent Labs Lab 12/24/15 1439 12/25/15 0534 12/28/15 0944  WBC 13.5* 12.4* 8.9  NEUTROABS 10.9*  --  6.5  HGB 13.1 12.1 11.5*  HCT 39.3 36.5 34.5*  MCV 96.1 94.1 95.6  PLT 255 249 967   Basic Metabolic Panel:  Recent Labs Lab 12/24/15 1439 12/25/15 0534 12/28/15 0944  NA 137 138 138  K 3.0* 3.8 2.8*  CL 104 110 111  CO2 26 21* 21*  GLUCOSE 96 94 142*  BUN <5* 6 <5*  CREATININE 0.74 0.63 0.81  CALCIUM 8.6* 8.3* 8.3*  MG 2.1  --   --   PHOS 3.5  --   --  GFR: Estimated Creatinine Clearance: 62.5 mL/min (by C-G formula based on SCr of 0.81 mg/dL). Liver Function Tests:  Recent Labs Lab 12/24/15 1439 12/28/15 0944  AST 11* 15  ALT 8* 10*  ALKPHOS 90 71  BILITOT 0.5 0.2*  PROT 6.8 5.8*  ALBUMIN 3.5 2.9*   No results for input(s): LIPASE, AMYLASE in the last 168 hours. No results for input(s): AMMONIA in the last 168 hours. Coagulation Profile: No results for input(s): INR, PROTIME in the last 168 hours. Cardiac Enzymes: No results for input(s): CKTOTAL, CKMB, CKMBINDEX, TROPONINI in the last 168 hours. BNP (last 3 results) No results for input(s): PROBNP in the last 8760 hours. HbA1C: No results for input(s): HGBA1C in the last 72 hours. CBG: No results for input(s): GLUCAP in the last 168 hours. Lipid Profile: No results for input(s): CHOL, HDL, LDLCALC, TRIG, CHOLHDL, LDLDIRECT in the last 72 hours. Thyroid Function Tests: No results for input(s): TSH, T4TOTAL, FREET4, T3FREE, THYROIDAB in the last 72  hours. Anemia Panel: No results for input(s): VITAMINB12, FOLATE, FERRITIN, TIBC, IRON, RETICCTPCT in the last 72 hours. Urine analysis:    Component Value Date/Time   COLORURINE YELLOW 04/19/2015 0838   APPEARANCEUR CLOUDY (A) 04/19/2015 0838   LABSPEC 1.013 04/19/2015 0838   PHURINE 6.0 04/19/2015 0838   GLUCOSEU NEGATIVE 04/19/2015 0838   HGBUR MODERATE (A) 04/19/2015 0838   HGBUR small 12/13/2009 0904   BILIRUBINUR NEGATIVE 04/19/2015 0838   BILIRUBINUR ne 01/30/2015 1543   KETONESUR 40 (A) 04/19/2015 0838   PROTEINUR NEGATIVE 04/19/2015 0838   UROBILINOGEN 0.2 01/30/2015 1543   UROBILINOGEN 0.2 09/25/2013 1350   NITRITE NEGATIVE 04/19/2015 0838   LEUKOCYTESUR TRACE (A) 04/19/2015 0838   Sepsis Labs: Invalid input(s): PROCALCITONIN, LACTICIDVEN  Recent Results (from the past 240 hour(s))  C difficile quick scan w PCR reflex     Status: None   Collection Time: 12/25/15 12:42 PM  Result Value Ref Range Status   C Diff antigen NEGATIVE NEGATIVE Final   C Diff toxin NEGATIVE NEGATIVE Final   C Diff interpretation No C. difficile detected.  Final  Gastrointestinal Panel by PCR , Stool     Status: None   Collection Time: 12/25/15 12:42 PM  Result Value Ref Range Status   Campylobacter species NOT DETECTED NOT DETECTED Final   Plesimonas shigelloides NOT DETECTED NOT DETECTED Final   Salmonella species NOT DETECTED NOT DETECTED Final   Yersinia enterocolitica NOT DETECTED NOT DETECTED Final   Vibrio species NOT DETECTED NOT DETECTED Final   Vibrio cholerae NOT DETECTED NOT DETECTED Final   Enteroaggregative E coli (EAEC) NOT DETECTED NOT DETECTED Final   Enteropathogenic E coli (EPEC) NOT DETECTED NOT DETECTED Final   Enterotoxigenic E coli (ETEC) NOT DETECTED NOT DETECTED Final   Shiga like toxin producing E coli (STEC) NOT DETECTED NOT DETECTED Final   E. coli O157 NOT DETECTED NOT DETECTED Final   Shigella/Enteroinvasive E coli (EIEC) NOT DETECTED NOT DETECTED Final    Cryptosporidium NOT DETECTED NOT DETECTED Final   Cyclospora cayetanensis NOT DETECTED NOT DETECTED Final   Entamoeba histolytica NOT DETECTED NOT DETECTED Final   Giardia lamblia NOT DETECTED NOT DETECTED Final   Adenovirus F40/41 NOT DETECTED NOT DETECTED Final   Astrovirus NOT DETECTED NOT DETECTED Final   Norovirus GI/GII NOT DETECTED NOT DETECTED Final   Rotavirus A NOT DETECTED NOT DETECTED Final   Sapovirus (I, II, IV, and V) NOT DETECTED NOT DETECTED Final      Radiology Studies:  No results found.   Scheduled Meds: . belladonna-PHENObarbital  5 mL Oral TID  . chlorhexidine  15 mL Mouth/Throat BID  . cyanocobalamin  1,000 mcg Intramuscular Q30 days  . FLUoxetine  20 mg Oral Daily  . fluticasone  2 spray Each Nare Daily  . hydrocortisone  25 mg Rectal Daily  . mesalamine  1,000 mg Rectal QHS  . predniSONE  40 mg Oral Q breakfast   Continuous Infusions: . sodium chloride 75 mL/hr at 12/28/15 2820    Marzetta Board, MD, PhD [facility] Pager [phone]  If 7PM-7AM, please contact night-coverage [email or web address] Password [email or web address] 12/28/2015, 12:14 PM

## 2015-12-28 NOTE — Progress Notes (Signed)
    Progress Note   Subjective  Chief Complaint: Ulcerative Colitis  Pt reports that this morning she "doesn't feel well". She explains that yesterday she did feel like she was possibly getting well enough to go home, but overnight and into this morning she has continued with multiple urgent stools. She continues to describe that after the Prednisone "kicks in" things seem to get better for a while. She was able to hold suppository last night for 2 hours. Denies any new complaints.    Objective   Vital signs in last 24 hours: Temp:  [97.9 F (36.6 C)-98.4 F (36.9 C)] 97.9 F (36.6 C) (09/01 0550) Pulse Rate:  [59-73] 59 (09/01 0550) Resp:  [15-16] 15 (09/01 0550) BP: (123-149)/(72-80) 149/80 (09/01 0550) SpO2:  [95 %-97 %] 97 % (09/01 0550) Last BM Date: 12/27/15 General: Caucasian female in NAD Heart:  Regular rate and rhythm; no murmurs Lungs: Respirations even and unlabored, lungs CTA bilaterally Abdomen:  Soft, nontender and nondistended. Normal bowel sounds. Extremities:  Without edema. Neurologic:  Alert and oriented,  grossly normal neurologically. Psych:  Cooperative. Normal mood and affect.  Intake/Output from previous day: No intake/output data recorded. Intake/Output this shift: No intake/output data recorded.  Lab Results: No results for input(s): WBC, HGB, HCT, PLT in the last 72 hours. BMET No results for input(s): NA, K, CL, CO2, GLUCOSE, BUN, CREATININE, CALCIUM in the last 72 hours. LFT No results for input(s): PROT, ALBUMIN, AST, ALT, ALKPHOS, BILITOT, BILIDIR, IBILI in the last 72 hours. PT/INR No results for input(s): LABPROT, INR in the last 72 hours.  Studies/Results: No results found.     Assessment / Plan:    Assessment:  1. OI:[id number] by perirectal abscess and rectal bleeding in the past, patient has had long-standing IBD and moderately active disease both endoscopically and histologically recently. She was hospitalized in 2016. She  has been on prednisone taper which resulted in symptom improvement and was switched to Uceris. She has been on this for the past 8 days and is now experiencing worsening of her symptoms including bloody stool, rectal pain, terrible diarrhea and abdominal pain. Patient is intolerant to mesalamine and sulfasalazine treatment. She is willing at this time to proceed with Biologics. CT  shows no signs of abscess, but was concerning for worsening disease.Patient received patient assistance papers for Remicade yesterday, plans to send them back in today.   Plan: 1. Continue Canasa suppositories at bedtime and continue Anusol suppositories during daytime 2, Continue Oral Prednisone 75m qd 3. Continue Donnatel q8hrs 4. Pt tells me that she has finished pt assistance forms and is planning on sending them back in today. 5. Will reorder CBC and CMP today 6. Please await any further recs from Dr. NSilverio Decamp Thank you for your kind consultation, we will continue to follow.   LOS: 4 days   JLevin Erp 12/28/2015, 8:59 AM  Pager # [phone]

## 2015-12-28 NOTE — Care Management Important Message (Signed)
Important Message  Patient Details  Name: FLORENCIA ZACCARO MRN: [id number] Date of Birth: 12-01-1940   Medicare Important Message Given:  Yes    [name] 12/28/2015, 9:54 AMImportant Message  Patient Details  Name: WYNONNA FITZHENRY MRN: [id number] Date of Birth: [birth year]   Medicare Important Message Given:  Yes    [name] 12/28/2015, 9:54 AM

## 2015-12-29 DIAGNOSIS — K51919 Ulcerative colitis, unspecified with unspecified complications: Secondary | ICD-10-CM

## 2015-12-29 LAB — POTASSIUM: Potassium: 3 mmol/L — ABNORMAL LOW (ref 3.5–5.1)

## 2015-12-29 MED ORDER — PB-HYOSCY-ATROPINE-SCOPOLAMINE 16.2 MG/5ML PO ELIX: 5.0000 mL | ORAL_SOLUTION | Freq: Three times a day (TID) | ORAL | 0 refills | Status: DC

## 2015-12-29 MED ORDER — POTASSIUM CHLORIDE ER 20 MEQ PO TBCR: 20.0000 meq | EXTENDED_RELEASE_TABLET | Freq: Every day | ORAL | 0 refills | Status: DC

## 2015-12-29 MED ORDER — MESALAMINE 1000 MG RE SUPP: 1000.0000 mg | Freq: Every day | RECTAL | 12 refills | Status: DC

## 2015-12-29 MED ORDER — POTASSIUM CHLORIDE CRYS ER 20 MEQ PO TBCR
60.0000 meq | EXTENDED_RELEASE_TABLET | Freq: Once | ORAL | Status: AC
  Administered 2015-12-29: 60 meq via ORAL
  Filled 2015-12-29: qty 3

## 2015-12-29 MED ORDER — PREDNISONE 20 MG PO TABS: 40.0000 mg | ORAL_TABLET | Freq: Every day | ORAL | 0 refills | Status: DC

## 2015-12-29 MED ORDER — HYDROCORTISONE ACETATE 25 MG RE SUPP: 25.0000 mg | Freq: Every day | RECTAL | 0 refills | Status: DC

## 2015-12-29 NOTE — Discharge Summary (Signed)
Physician Discharge Summary  Sara Barnes TKZ:[id number] DOB: 06-14-1940 DOA: 12/24/2015  PCP: Ann Held, DO  Admit date: 12/24/2015 Discharge date: 12/29/2015  Time spent: > 35 minutes  Recommendations for Outpatient Follow-up:  1. Monitor potassium levels 2. Ensure patient f/u with GI after discharge   Discharge Diagnoses:  Active Problems:   Rectal bleeding   Rectal pain   Dehydration   Crohn disease (HCC)   Tenesmus   Ulcerative pancolitis with rectal bleeding (HCC)   IBS (irritable bowel syndrome)   Discharge Condition: stable  Diet recommendation: regular diet  Filed Weights   12/26/15 2020  Weight: 77 kg (169 lb 12.1 oz)    History of present illness:  75 y.o.femalewith medical history significant of ulcerative colitis versus Crohn's disease, otherwise healthy, presents to the emergency room directly from gastroenterology office with complaints of abdominal pain, concern for UC flare-up  Hospital Course:  UC flair up - Per GI: Plan: 1. Continue Canasa suppositories at bedtime and continue Anusol suppositories during daytime 2. Continue Oral Prednisone 1m qd 3. Continue Donnatel q8hrs 4. Pt tells me that she has finished pt assistance forms and has faxed them in 5. Due to hypokalemia, ordered 667m to be given now and recommend d/c home on 20 mEq of K qd 6. Patient needs to be followed in our clinic in 1-2 weeks by Dr. PyHilarie Fredrickson. Discussed with Dr. NaSilverio Decampok with discharge home today  Hypokalemia - Pt prior to d/c had 60 meq of KCL administered. Last K level was up from 2.8 to 3.0 - d/c on potassium replacement.   Procedures:  None  Consultations:  GI: Dr. NaSilverio DecampDischarge Exam: Vitals:   12/28/15 2042 12/29/15 0500  BP: (!) 144/77 (!) 167/77  Pulse: 60 63  Resp: 16 18  Temp: 98.2 F (36.8 C) 98.2 F (36.8 C)    General: Pt in nad, alert and awake Cardiovascular: rrr, no rubs Respiratory: no increased wob, no  wheezes  Discharge Instructions   Discharge Instructions    Call MD for:  extreme fatigue    Complete by:  As directed   Call MD for:  severe uncontrolled pain    Complete by:  As directed   Call MD for:  temperature >100.4    Complete by:  As directed   Diet - low sodium heart healthy    Complete by:  As directed   Discharge instructions    Complete by:  As directed   Please follow-up with your primary care physician within the next one or 2 weeks or sooner should any new concerns arise.   Increase activity slowly    Complete by:  As directed     Current Discharge Medication List    START taking these medications   Details  belladonna-PHENObarbital (DONNATAL) 16.2 MG/5ML ELIX Take 5 mLs (16.2 mg total) by mouth 3 (three) times daily. Qty: 600 mL, Refills: 0    hydrocortisone (ANUSOL-HC) 25 MG suppository Place 1 suppository (25 mg total) rectally daily. Qty: 12 suppository, Refills: 0    mesalamine (CANASA) 1000 MG suppository Place 1 suppository (1,000 mg total) rectally at bedtime. Qty: 30 suppository, Refills: 12    potassium chloride 20 MEQ TBCR Take 20 mEq by mouth daily. Qty: 20 tablet, Refills: 0    predniSONE (DELTASONE) 20 MG tablet Take 2 tablets (40 mg total) by mouth daily with breakfast. Qty: 36 tablet, Refills: 0      CONTINUE these medications which have NOT  CHANGED   Details  acetaminophen (TYLENOL) 325 MG tablet Take 650 mg by mouth every 6 (six) hours as needed for moderate pain. Reported on 09/25/2015    cyanocobalamin (,VITAMIN B-12,) 1000 MCG/ML injection INJECT 1 ML (1,000 MCG TOTAL) INTO THE MUSCLE EVERY 30 (THIRTY) DAYS. Qty: 12 mL, Refills: 0   Associated Diagnoses: Vitamin B12 deficiency; Colitis, chronic, ulcerative, with rectal bleeding ([place]); Rectal pain    FLUoxetine (PROZAC) 20 MG capsule Take 1 capsule (20 mg total) by mouth daily. Qty: 90 capsule, Refills: 1    fluticasone (FLONASE) 50 MCG/ACT nasal spray Place 2 sprays into both nostrils  daily. Qty: 16 g, Refills: 6   Associated Diagnoses: Acute pansinusitis, recurrence not specified    zolpidem (AMBIEN) 5 MG tablet Take 1 tablet (5 mg total) by mouth at bedtime as needed. Qty: 30 tablet, Refills: 2      STOP taking these medications     Budesonide (UCERIS) 9 MG TB24      famotidine (PEPCID) 20 MG tablet        Allergies  Allergen Reactions  . Codeine Nausea And Vomiting  . Mesalamine Nausea And Vomiting  . Other Itching    PERFUMED LOTIONS  Cant have MRI due to ear implant   . Oxycodone-Acetaminophen Rash    Itching & rash  . Sulfa Antibiotics Rash   Follow-up Information    Jerene Bears, MD. Call on 01/01/2016.   Specialty:  Gastroenterology Why:  Needs follow up in 1-2 weeks Contact information: [address] [place] [address] [phone]            The results of significant diagnostics from this hospitalization (including imaging, microbiology, ancillary and laboratory) are listed below for reference.    Significant Diagnostic Studies: Ct Abdomen Pelvis W Contrast  Result Date: 12/25/2015 CLINICAL DATA:  Ulcerative colitis versus Crohn's disease. Abdominal pain. Nausea without vomiting. EXAM: CT ABDOMEN AND PELVIS WITH CONTRAST TECHNIQUE: Multidetector CT imaging of the abdomen and pelvis was performed using the standard protocol following bolus administration of intravenous contrast. CONTRAST:  157m ISOVUE-300 IOPAMIDOL (ISOVUE-300) INJECTION 61% COMPARISON:  CT abdomen/ pelvis 09/13/2015 FINDINGS: Lower chest: No pulmonary nodules. Trace pericardial fluid. Bilateral breast implants are noted. Hepatobiliary: Normal hepatic size and contours without focal liver lesion. No perihepatic ascites. No intra- or extrahepatic biliary dilatation. Normal gallbladder. Pancreas: Normal pancreatic contours and enhancement. No peripancreatic fluid collection or pancreatic ductal dilatation. Spleen: Normal. Adrenals/Urinary Tract: Normal adrenal glands.  No hydronephrosis or solid renal mass. Stomach/Bowel: There is wall thickening and mild inflammatory stranding surrounding the colon from the rectum to the hepatic flexure. The appearance of the rectosigmoid colon is unchanged compared to the study of 09/13/2015, but the involvement of the descending colon and transverse colon is new. There is no evidence of small-bowel obstruction. There is no abdominal fluid collection. Vascular/Lymphatic: Normal course and caliber of the major abdominal vessels. No abdominal or pelvic adenopathy. Reproductive: The uterus is surgically absent. Ovaries are not clearly visualized. No free fluid in the pelvis. Musculoskeletal: Multilevel thoracic osteophytosis. No lytic or blastic lesions. The visualized extraperitoneal and extrathoracic soft tissues are normal. Other: No contributory non-categorized findings. IMPRESSION: 1. Wall thickening and inflammatory stranding of the colon from the hepatic flexure to the rectum, consistent with colitis. The distribution is compatible with the diagnosis of ulcerative colitis. The involvement of the rectosigmoid colon is unchanged compared to the prior study, but the involvement of the transverse and descending colon is new/worsened. 2.  No fluid collection or abscess. Electronically Signed   By: Ulyses Jarred M.D.   On: 12/25/2015 01:43    Microbiology: Recent Results (from the past 240 hour(s))  C difficile quick scan w PCR reflex     Status: None   Collection Time: 12/25/15 12:42 PM  Result Value Ref Range Status   C Diff antigen NEGATIVE NEGATIVE Final   C Diff toxin NEGATIVE NEGATIVE Final   C Diff interpretation No C. difficile detected.  Final  Gastrointestinal Panel by PCR , Stool     Status: None   Collection Time: 12/25/15 12:42 PM  Result Value Ref Range Status   Campylobacter species NOT DETECTED NOT DETECTED Final   Plesimonas shigelloides NOT DETECTED NOT DETECTED Final   Salmonella species NOT DETECTED NOT DETECTED  Final   Yersinia enterocolitica NOT DETECTED NOT DETECTED Final   Vibrio species NOT DETECTED NOT DETECTED Final   Vibrio cholerae NOT DETECTED NOT DETECTED Final   Enteroaggregative E coli (EAEC) NOT DETECTED NOT DETECTED Final   Enteropathogenic E coli (EPEC) NOT DETECTED NOT DETECTED Final   Enterotoxigenic E coli (ETEC) NOT DETECTED NOT DETECTED Final   Shiga like toxin producing E coli (STEC) NOT DETECTED NOT DETECTED Final   E. coli O157 NOT DETECTED NOT DETECTED Final   Shigella/Enteroinvasive E coli (EIEC) NOT DETECTED NOT DETECTED Final   Cryptosporidium NOT DETECTED NOT DETECTED Final   Cyclospora cayetanensis NOT DETECTED NOT DETECTED Final   Entamoeba histolytica NOT DETECTED NOT DETECTED Final   Giardia lamblia NOT DETECTED NOT DETECTED Final   Adenovirus F40/41 NOT DETECTED NOT DETECTED Final   Astrovirus NOT DETECTED NOT DETECTED Final   Norovirus GI/GII NOT DETECTED NOT DETECTED Final   Rotavirus A NOT DETECTED NOT DETECTED Final   Sapovirus (I, II, IV, and V) NOT DETECTED NOT DETECTED Final     Labs: Basic Metabolic Panel:  Recent Labs Lab 12/24/15 1439 12/25/15 0534 12/28/15 0944 12/29/15 1130  NA 137 138 138  --   K 3.0* 3.8 2.8* 3.0*  CL 104 110 111  --   CO2 26 21* 21*  --   GLUCOSE 96 94 142*  --   BUN <5* 6 <5*  --   CREATININE 0.74 0.63 0.81  --   CALCIUM 8.6* 8.3* 8.3*  --   MG 2.1  --   --   --   PHOS 3.5  --   --   --    Liver Function Tests:  Recent Labs Lab 12/24/15 1439 12/28/15 0944  AST 11* 15  ALT 8* 10*  ALKPHOS 90 71  BILITOT 0.5 0.2*  PROT 6.8 5.8*  ALBUMIN 3.5 2.9*   No results for input(s): LIPASE, AMYLASE in the last 168 hours. No results for input(s): AMMONIA in the last 168 hours. CBC:  Recent Labs Lab 12/24/15 1439 12/25/15 0534 12/28/15 0944  WBC 13.5* 12.4* 8.9  NEUTROABS 10.9*  --  6.5  HGB 13.1 12.1 11.5*  HCT 39.3 36.5 34.5*  MCV 96.1 94.1 95.6  PLT 255 249 267   Cardiac Enzymes: No results for  input(s): CKTOTAL, CKMB, CKMBINDEX, TROPONINI in the last 168 hours. BNP: BNP (last 3 results) No results for input(s): BNP in the last 8760 hours.  ProBNP (last 3 results) No results for input(s): PROBNP in the last 8760 hours.  CBG: No results for input(s): GLUCAP in the last 168 hours.  Signed:  Velvet Bathe MD.  [facility] 12/29/2015, 1:22 PM

## 2015-12-29 NOTE — Progress Notes (Signed)
    Progress Note   Subjective  Chief Complaint: Ulcerative Colitis  Pt reports doing well yesterday, this morning with ~7 episodes of urgency, associated with rectal "spasm", pt still requests to go home as she is "managing everything by herself at this point". Denies any new complaints or concerns    Objective   Vital signs in last 24 hours: Temp:  [98 F (36.7 C)-98.9 F (37.2 C)] 98.2 F (36.8 C) (09/02 0500) Pulse Rate:  [60-72] 63 (09/02 0500) Resp:  [16-18] 18 (09/02 0500) BP: (142-167)/(74-77) 167/77 (09/02 0500) SpO2:  [95 %-98 %] 96 % (09/02 0500) Last BM Date: 12/28/15 General: Caucasian female in NAD Heart:  Regular rate and rhythm; no murmurs Lungs: Respirations even and unlabored, lungs CTA bilaterally Abdomen:  Soft, nontender and nondistended. Normal bowel sounds. Extremities:  Without edema. Neurologic:  Alert and oriented,  grossly normal neurologically. Psych:  Cooperative. Normal mood and affect.  Intake/Output from previous day: 09/01 0701 - 09/02 0700 In: 960 [P.O.:360; I.V.:600] Out: -   Lab Results:  Recent Labs  12/28/15 0944  WBC 8.9  HGB 11.5*  HCT 34.5*  PLT 267   BMET  Recent Labs  12/28/15 0944  NA 138  K 2.8*  CL 111  CO2 21*  GLUCOSE 142*  BUN <5*  CREATININE 0.81  CALCIUM 8.3*   LFT  Recent Labs  12/28/15 0944  PROT 5.8*  ALBUMIN 2.9*  AST 15  ALT 10*  ALKPHOS 71  BILITOT 0.2*   PT/INR No results for input(s): LABPROT, INR in the last 72 hours.  Studies/Results: No results found.     Assessment / Plan:   Assessment:  1. RC:[id number] by perirectal abscess and rectal bleeding in the past, patient has had long-standing IBD and moderately active disease both endoscopically and histologically recently. She was hospitalized in 2016. She has been on prednisone taper which resulted in symptom improvement and was switched to Uceris. She has been on this for the past 8 days and is now experiencing worsening of  her symptoms including bloody stool, rectal pain, terrible diarrhea and abdominal pain. Patient is intolerant to mesalamine and sulfasalazine treatment. She is willing at this time to proceed with Biologics. CT shows no signs of abscess, but was concerning for worsening disease.Patient received patient assistance papers for Remicade and faxed them in on Friday. Currently symptoms moderately controlled on suppositories and prednisone. 2. Hypokalemia: will replenish  Plan: 1. Continue Canasa suppositories at bedtime and continue Anusol suppositories during daytime 2. Continue Oral Prednisone 32m qd 3. Continue Donnatel q8hrs 4. Pt tells me that she has finished pt assistance forms and has faxed them in 5. Due to hypokalemia, ordered 631m to be given now and recommend d/c home on 20 mEq of K qd 6. Patient needs to be followed in our clinic in 1-2 weeks by Dr. PyHilarie Fredrickson. Discussed with Dr. NaSilverio Decampok with discharge home today  Thank you for your kind consultation.   LOS: 5 days   JeLavone NianeKingman Community Hospital9/05/2015, 10:29 AM  Pager # [phone]

## 2015-12-29 NOTE — Progress Notes (Signed)
Pt left at this time with her sister. Alert, oriented, and without c/o. Discharge instructions given/explained with pt verbalizing understanding. Prescription given and others to be picked up at pt pharmacy. Followup appointment noted.

## 2016-01-01 ENCOUNTER — Telehealth: Payer: Self-pay | Admitting: Internal Medicine

## 2016-01-01 ENCOUNTER — Emergency Department (HOSPITAL_COMMUNITY)
Admission: EM | Admit: 2016-01-01 | Discharge: 2016-01-01 | Disposition: A | Discharge status: Home / Self Care | Payer: Commercial Managed Care - HMO | Attending: Physician Assistant | Admitting: Physician Assistant

## 2016-01-01 ENCOUNTER — Emergency Department (HOSPITAL_COMMUNITY): Payer: Commercial Managed Care - HMO

## 2016-01-01 ENCOUNTER — Encounter (HOSPITAL_COMMUNITY): Payer: Self-pay

## 2016-01-01 DIAGNOSIS — R030 Elevated blood-pressure reading, without diagnosis of hypertension: Secondary | ICD-10-CM | POA: Diagnosis not present

## 2016-01-01 DIAGNOSIS — R109 Unspecified abdominal pain: Secondary | ICD-10-CM | POA: Diagnosis not present

## 2016-01-01 DIAGNOSIS — K5100 Ulcerative (chronic) pancolitis without complications: Secondary | ICD-10-CM | POA: Insufficient documentation

## 2016-01-01 DIAGNOSIS — Z853 Personal history of malignant neoplasm of breast: Secondary | ICD-10-CM | POA: Diagnosis not present

## 2016-01-01 DIAGNOSIS — Z79899 Other long term (current) drug therapy: Secondary | ICD-10-CM | POA: Diagnosis not present

## 2016-01-01 DIAGNOSIS — R197 Diarrhea, unspecified: Secondary | ICD-10-CM | POA: Diagnosis present

## 2016-01-01 DIAGNOSIS — K625 Hemorrhage of anus and rectum: Secondary | ICD-10-CM | POA: Diagnosis not present

## 2016-01-01 LAB — COMPREHENSIVE METABOLIC PANEL
ALBUMIN: 3 g/dL — AB (ref 3.5–5.0)
ALK PHOS: 74 U/L (ref 38–126)
ALT: 9 U/L — AB (ref 14–54)
AST: 9 U/L — ABNORMAL LOW (ref 15–41)
Anion gap: 10 (ref 5–15)
BUN: 6 mg/dL (ref 6–20)
CALCIUM: 8.5 mg/dL — AB (ref 8.9–10.3)
CHLORIDE: 104 mmol/L (ref 101–111)
CO2: 24 mmol/L (ref 22–32)
Creatinine, Ser: 0.55 mg/dL (ref 0.44–1.00)
GFR calc non Af Amer: >60 mL/min (ref >60)
GLUCOSE: 111 mg/dL — AB (ref 65–99)
Potassium: 3.3 mmol/L — ABNORMAL LOW (ref 3.5–5.1)
SODIUM: 138 mmol/L (ref 135–145)
Total Bilirubin: 0.7 mg/dL (ref 0.3–1.2)
Total Protein: 6.4 g/dL — ABNORMAL LOW (ref 6.5–8.1)

## 2016-01-01 LAB — CBC
HCT: 38.2 % (ref 36.0–46.0)
Hemoglobin: 13.1 g/dL (ref 12.0–15.0)
MCH: 31.4 pg (ref 26.0–34.0)
MCHC: 34.3 g/dL (ref 30.0–36.0)
MCV: 91.6 fL (ref 78.0–100.0)
PLATELETS: 280 10*3/uL (ref 150–400)
RBC: 4.17 MIL/uL (ref 3.87–5.11)
RDW: 11.8 % (ref 11.5–15.5)
WBC: 12.9 10*3/uL — AB (ref 4.0–10.5)

## 2016-01-01 LAB — PROTIME-INR
INR: 1.17
Prothrombin Time: 15 seconds (ref 11.4–15.2)

## 2016-01-01 LAB — TYPE AND SCREEN
ABO/RH(D): A NEG
Antibody Screen: NEGATIVE

## 2016-01-01 LAB — POC OCCULT BLOOD, ED: FECAL OCCULT BLD: POSITIVE — AB

## 2016-01-01 MED ORDER — IOPAMIDOL (ISOVUE-300) INJECTION 61%
30.0000 mL | Freq: Once | INTRAVENOUS | Status: AC | PRN
  Administered 2016-01-01: 15 mL via ORAL

## 2016-01-01 MED ORDER — ONDANSETRON HCL 4 MG PO TABS: 4.0000 mg | ORAL_TABLET | Freq: Three times a day (TID) | ORAL | 0 refills | Status: DC | PRN

## 2016-01-01 MED ORDER — HYDROCODONE-ACETAMINOPHEN 5-325 MG PO TABS: 2.0000 | ORAL_TABLET | ORAL | 0 refills | Status: DC | PRN

## 2016-01-01 MED ORDER — SODIUM CHLORIDE 0.9 % IV BOLUS (SEPSIS)
1000.0000 mL | Freq: Once | INTRAVENOUS | Status: AC
  Administered 2016-01-01: 1000 mL via INTRAVENOUS

## 2016-01-01 MED ORDER — ONDANSETRON HCL 4 MG/2ML IJ SOLN
4.0000 mg | Freq: Once | INTRAMUSCULAR | Status: AC
  Administered 2016-01-01: 4 mg via INTRAVENOUS
  Filled 2016-01-01: qty 2

## 2016-01-01 MED ORDER — FENTANYL CITRATE (PF) 100 MCG/2ML IJ SOLN
50.0000 ug | Freq: Once | INTRAMUSCULAR | Status: AC
  Administered 2016-01-01: 50 ug via INTRAVENOUS
  Filled 2016-01-01: qty 2

## 2016-01-01 MED ORDER — IOPAMIDOL (ISOVUE-300) INJECTION 61%
100.0000 mL | Freq: Once | INTRAVENOUS | Status: AC | PRN
  Administered 2016-01-01: 100 mL via INTRAVENOUS

## 2016-01-01 MED ORDER — ONDANSETRON HCL 4 MG/2ML IJ SOLN: 4.0000 mg | Freq: Once | INTRAMUSCULAR | Status: DC | PRN

## 2016-01-01 NOTE — ED Notes (Signed)
Bed: WA21 Expected date:  Expected time:  Means of arrival:  Comments: 75yo F rectal bleeding

## 2016-01-01 NOTE — ED Provider Notes (Signed)
[facility] DEPT Provider Note   CSN: [id number] Arrival date & time: 01/01/16  1124     History   Chief Complaint Chief Complaint  Patient presents with  . GI Bleeding  . Ulcerative Colitis    HPI Sara Barnes is a 75 y.o. female.  HPI   Patient is a 75 year old female presenting with ulcerative colitis flare. Patient had recent hospitalization was discharged on Saturday. Patient feels that her pain has been getting worse. She's unable to make it to the bathroom anymore. She's having profuse diarrhea. She says that she is also not having good by mouth intake. Patient reports worsening rectal pain. She is taken Tylenol which has not helped.   No fevers.  Past Medical History:  Diagnosis Date  . Basal cell carcinoma   . 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 Complication of anesthesia    anxiety post-op after ear surgeries  . Dental crowns present    recent root canal 11/2014  . Family history of adverse reaction to anesthesia    pt's sister has hx. of post-op N/V  . Indigestion   . PONV (postoperative nausea and vomiting)   . Runny nose 12/07/2014   clear drainage, per pt.  . Seasonal allergies   . Ulcerative colitis   . Vitamin B12 deficiency     Patient Active Problem List   Diagnosis Date Noted  . IBS (irritable bowel syndrome)   . Tenesmus   . 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 Diarrhea 12/24/2015  . Crohn disease ([place]) 12/24/2015  . Abscess of anal or rectal region   . Peri-rectal abscess   . Dehydration   . Ulcerative pancolitis with abscess ([place])   . Sepsis ([place]) 04/19/2015  . Rectal pain   . Leucocytosis   . Rectal bleeding 04/18/2015  . Ulcerative colitis ([place]) 04/18/2015  . 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 ABDOMINAL HYSTERECTOMY     partial  . APPENDECTOMY    . BASAL CELL CARCINOMA EXCISION  Left    nose  . BREAST ENHANCEMENT SURGERY    . BREAST LUMPECTOMY Left    age 26's  . BREAST LUMPECTOMY WITH RADIOACTIVE SEED AND SENTINEL LYMPH NODE BIOPSY Left 12/13/2014   Procedure: LEFT BREAST LUMPECTOMY WITH RADIOACTIVE SEED AND SENTINEL LYMPH NODE BIOPSY;  Surgeon: [name], MD;  Location: [facility];  Service: General;  Laterality: Left;  . COLONOSCOPY WITH PROPOFOL  05/28/2011; 06/28/2014  . INCISION AND DRAINAGE PERIRECTAL ABSCESS N/A 09/28/2013   Procedure: IRRIGATION AND DEBRIDEMENT PERIANAL ABSCESS, proctoscopy;  Surgeon: Shann Medal, MD;  Location: [facility];  Service: General;  Laterality: N/A;  . INNER EAR SURGERY Bilateral     OB History    No data available       Home Medications    Prior to Admission medications   Medication Sig Start Date End Date Taking? Authorizing Provider  acetaminophen (TYLENOL) 325 MG tablet Take 325-650 mg by mouth every 6 (six) hours as needed for moderate pain. Reported on 09/25/2015   Yes Historical Provider, MD  cyanocobalamin (,VITAMIN B-12,) 1000 MCG/ML injection INJECT 1 ML (1,000 MCG TOTAL) INTO THE MUSCLE EVERY 30 (THIRTY) DAYS. 09/11/15  Yes Jerene Bears, MD  FLUoxetine (PROZAC) 20 MG capsule Take 1 capsule (20 mg total) by mouth daily. 10/11/15  Yes Ann Held,  DO  fluticasone (FLONASE) 50 MCG/ACT nasal spray Place 2 sprays into both nostrils daily. Patient taking differently: Place 2 sprays into both nostrils daily as needed for allergies or rhinitis.  06/15/15  Yes Yvonne R Lowne Chase, DO  hydrocortisone (ANUSOL-HC) 25 MG suppository Place 1 suppository (25 mg total) rectally daily. 12/29/15  Yes Velvet Bathe, MD  mesalamine (CANASA) 1000 MG suppository Place 1 suppository (1,000 mg total) rectally at bedtime. 12/29/15  Yes Velvet Bathe, MD  potassium chloride 20 MEQ TBCR Take 20 mEq by mouth daily. 12/30/15 01/09/16 Yes Velvet Bathe, MD  predniSONE (DELTASONE) 20 MG tablet Take 2 tablets (40 mg total) by mouth daily  with breakfast. 12/29/15 01/14/16 Yes Velvet Bathe, MD  zolpidem (AMBIEN) 5 MG tablet Take 1 tablet (5 mg total) by mouth at bedtime as needed. Patient taking differently: Take 5 mg by mouth at bedtime as needed for sleep.  12/11/15  Yes Yvonne R Lowne Chase, DO  belladonna-PHENObarbital (DONNATAL) 16.2 MG/5ML ELIX Take 5 mLs (16.2 mg total) by mouth 3 (three) times daily. 12/29/15   Velvet Bathe, MD  HYDROcodone-acetaminophen (NORCO/VICODIN) 5-325 MG tablet Take 2 tablets by mouth every 4 (four) hours as needed. 01/01/16   Kannon Granderson Lyn Alayja Armas, MD  ondansetron (ZOFRAN) 4 MG tablet Take 1 tablet (4 mg total) by mouth every 8 (eight) hours as needed for nausea or vomiting. 01/01/16   Kruti Horacek Lyn Orla Jolliff, MD    Family History Family History  Problem Relation Age of Onset  . Prostate cancer Brother 64  . Kidney disease Brother   . Diabetes Brother   . Brain cancer Father   . Rheum arthritis Mother   . Anesthesia problems Sister     post-op N/V  . Emphysema Maternal Grandfather   . Asthma Sister   . Rheum arthritis Brother   . Colon cancer Neg Hx   . Esophageal cancer Neg Hx   . Breast cancer Neg Hx     Social History Social History  Substance Use Topics  . Smoking status: Never Smoker  . Smokeless tobacco: Never Used  . Alcohol use 0.0 oz/week     Comment: daily wine with dinner     Allergies   Codeine; Mesalamine; Other; Oxycodone-acetaminophen; and Sulfa antibiotics   Review of Systems Review of Systems  Constitutional: Negative for activity change.  Respiratory: Negative for shortness of breath.   Cardiovascular: Negative for chest pain.  Gastrointestinal: Positive for abdominal pain, diarrhea and rectal pain.     Physical Exam Updated Vital Signs BP 120/71 (BP Location: Right Arm)   Pulse 71   Temp 99 F (37.2 C) (Oral)   Resp 22   SpO2 95%   Physical Exam  Constitutional: She is oriented to person, place, and time. She appears well-developed and well-nourished.    HENT:  Head: Normocephalic and atraumatic.  Eyes: Right eye exhibits no discharge.  Cardiovascular: Normal rate, regular rhythm and normal heart sounds.   No murmur heard. Pulmonary/Chest: Effort normal and breath sounds normal. She has no wheezes. She has no rales.  Abdominal: Soft. She exhibits no distension. There is tenderness.  Genitourinary:  Genitourinary Comments: Mild erythema surrounding rectum on bilateral  Unable to do a rectal because of pain.   Neurological: She is oriented to person, place, and time.  Skin: Skin is warm and dry. She is not diaphoretic.  Psychiatric: She has a normal mood and affect.  Nursing note and vitals reviewed.    ED Treatments / Results  Labs (  all labs ordered are listed, but only abnormal results are displayed) Labs Reviewed  COMPREHENSIVE METABOLIC PANEL - Abnormal; Notable for the following:       Result Value   Potassium 3.3 (*)    Glucose, Bld 111 (*)    Calcium 8.5 (*)    Total Protein 6.4 (*)    Albumin 3.0 (*)    AST 9 (*)    ALT 9 (*)    All other components within normal limits  CBC - Abnormal; Notable for the following:    WBC 12.9 (*)    All other components within normal limits  POC OCCULT BLOOD, ED - Abnormal; Notable for the following:    Fecal Occult Bld POSITIVE (*)    All other components within normal limits  PROTIME-INR  TYPE AND SCREEN    EKG  EKG Interpretation None       Radiology Ct Abdomen Pelvis W Contrast  Result Date: 01/01/2016 CLINICAL DATA:  Ulcerative colitis, diarrhea and worsening abdominal pain. Recent hospitalization. EXAM: CT ABDOMEN AND PELVIS WITH CONTRAST TECHNIQUE: Multidetector CT imaging of the abdomen and pelvis was performed using the standard protocol following bolus administration of intravenous contrast. CONTRAST:  151m ISOVUE-300 IOPAMIDOL (ISOVUE-300) INJECTION 61%, 139mISOVUE-300 IOPAMIDOL (ISOVUE-300) INJECTION 61% COMPARISON:  12/24/2015, 09/13/2015 and 04/19/2015  FINDINGS: Lower chest: Small right pleural effusion and trace left pleural effusion, new since the prior CT. Hepatobiliary: The liver and gallbladder have a normal appearance. No evidence of biliary ductal dilatation. Pancreas: No mass, inflammatory changes, or other significant abnormality. Spleen: Within normal limits in size and appearance. Adrenals/Urinary Tract: No masses identified. No evidence of hydronephrosis. No urinary tract calculi identified. The bladder is unremarkable. Stomach/Bowel: Inflammation and circumferential wall thickening involving the hepatic flexure and transverse colon has resolved. There remains segmental thickening of the colon beginning just below the splenic flexure and extending into the proximal sigmoid. No evidence of bowel obstruction, perforation or abscess. Small bowel appears normal. No free fluid. Vascular/Lymphatic: No pathologically enlarged lymph nodes. No evidence of abdominal aortic aneurysm. Reproductive: Status post hysterectomy. No pelvic masses identified. Small retained ovaries again visualized. Other: No hernias seen. Musculoskeletal:  Bony structures show stable spondylosis at L5-S1. IMPRESSION: 1. Interval improvement by CT in appearance of colitis. Since the study on 08/28, thickening of the hepatic flexure and transverse colon appear essentially resolved. There remains wall thickening involving the descending colon extending into the proximal sigmoid. No evidence of obstruction, bowel perforation or abscess. 2. New small right and trace left pleural effusions. Electronically Signed   By: GlAletta Edouard.D.   On: 01/01/2016 14:29    Procedures Procedures (including critical care time)  Medications Ordered in ED Medications  ondansetron (ZOFRAN) injection 4 mg (not administered)  sodium chloride 0.9 % bolus 1,000 mL (0 mLs Intravenous Stopped 01/01/16 1356)  fentaNYL (SUBLIMAZE) injection 50 mcg (50 mcg Intravenous Given 01/01/16 1242)  ondansetron  (ZOFRAN) injection 4 mg (4 mg Intravenous Given 01/01/16 1242)  iopamidol (ISOVUE-300) 61 % injection 30 mL (15 mLs Oral Contrast Given 01/01/16 1359)  iopamidol (ISOVUE-300) 61 % injection 100 mL (100 mLs Intravenous Contrast Given 01/01/16 1359)     Initial Impression / Assessment and Plan / ED Course  I have reviewed the triage vital signs and the nursing notes.  Pertinent labs & imaging results that were available during my care of the patient were reviewed by me and considered in my medical decision making (see chart for details).  Clinical Course  She is a 75 year old female with ulcerative colitis with recent hospitalization discharge on Saturday. She reports that she went home and was unable to take much by mouth, had diarrhea, with bits of blood. Today patient unable to tolerate rectal exam secondary to pain. We will get labs. We will get a CAT scan. Patient had a CAT scan last week but she feels that her pain has gotten worse. Concern for developing abscess given the level of ulcer colitis the patient has. Patient is agreeing to take biologic now, has been avoiding them for several years as her ulcerative colitis and getting worse and she has been having multiple hospitalizations. She now says that she will consider biologic  Patient's vital signs are normal. However patient is an a lot of pain, tearful. We'll touch base with her GI physician.  Touched base with GI. They're well acquainted with patient and believe that it is partly IBS that is playing a role in her symptomatology. They recommend follow-up and agree that she does not need inpatient treatment at this time.  atietn vital signs, labs and CT are all reassuring.  Patient feels much better after pain medication. I don't think patient meets criteria for admission given her normal vital signs, physical exam, CAT scan that is improving since a week ago. We will give her medications to take at home. Previously she did not have any  medications at home to help with nausea or pain. Told  patient's return with any concerns.  Final Clinical Impressions(s) / ED Diagnoses   Final diagnoses:  Ulcerative (chronic) enterocolitis, without complications (HCC)    New Prescriptions New Prescriptions   HYDROCODONE-ACETAMINOPHEN (NORCO/VICODIN) 5-325 MG TABLET    Take 2 tablets by mouth every 4 (four) hours as needed.   ONDANSETRON (ZOFRAN) 4 MG TABLET    Take 1 tablet (4 mg total) by mouth every 8 (eight) hours as needed for nausea or vomiting.     Angelamarie Avakian Julio Alm, MD 01/01/16 1539

## 2016-01-01 NOTE — Discharge Instructions (Signed)
We are so sorry about your ulcerative colitis. We will need to take the pain medication and nausea medication as given. Please follow-up with her GI physician as soon as possible.  Please return with any concerns.

## 2016-01-01 NOTE — ED Triage Notes (Signed)
BIB EMS from home w/ c/o of GI Bleed r/t Ulcerative Colitis dx. Pt was d/c'd last Saturday from here for the same. Pt arrives here A+OX4, speaking in complete sentences.

## 2016-01-01 NOTE — Telephone Encounter (Signed)
Recent hospitalization for IBD flare requiring IV steriods, bowel rest Needs initiation of biologics, which were delayed in hospital while waiting decision on insurance coverage If she cannot eat or tolerate PO and feels that weak, I agree with return to the hospital via the ED for probable admission Expect Remicade start while hospitalized

## 2016-01-01 NOTE — Telephone Encounter (Signed)
Spoke with pts sister and she is aware. States she does not think she can transport pt by car. Instructed her to call an ambulance for transportation to the ER. She verbalized understanding.

## 2016-01-01 NOTE — Telephone Encounter (Signed)
Pts sister states that pt is not any better since going home from hospital, thinks she is actually worse. Reports she cannot eat, can't stay out of the bathroom, so weak that she cannot stand. Discussed that she may need to go back to the ER. Please advise.

## 2016-01-02 ENCOUNTER — Telehealth: Payer: Self-pay | Admitting: Internal Medicine

## 2016-01-02 ENCOUNTER — Other Ambulatory Visit: Payer: Self-pay

## 2016-01-02 DIAGNOSIS — K5190 Ulcerative colitis, unspecified, without complications: Secondary | ICD-10-CM

## 2016-01-02 MED ORDER — HYDROCODONE-ACETAMINOPHEN 5-325 MG PO TABS: 2.0000 | ORAL_TABLET | ORAL | 0 refills | Status: DC | PRN

## 2016-01-02 NOTE — Progress Notes (Signed)
Noted and order in epic.

## 2016-01-02 NOTE — Telephone Encounter (Signed)
Pt will come for labs tomorrow and pick up script for vicodin. Records faxed to [facility] rheumatologist for initiation of Remicade.

## 2016-01-02 NOTE — Telephone Encounter (Signed)
Discussed in person with Rosanne Sack, RN Patient offered office visit slot with me this afternoon but she and her sister decided to continue current therapy at home She will remain on prednisone 40 mg daily Begin Remicade ASAP with induction Continue Vicodin 1-2 tablets every 6 hours as needed for pain. This will be for a short duration until colitis under control Recheck potassium Have patient notify us if condition changes

## 2016-01-02 NOTE — Telephone Encounter (Signed)
Noted, order In epic.

## 2016-01-02 NOTE — Telephone Encounter (Signed)
Pts sister states they were told in the ER yesterday that pt needed repeat labs this week and they also want to know if she needs an OV this week. Sister states she thinks her sister is ready to start the Remicade. [employer] assistance is still pending insurance verification. Message left for [employer] to check on that application. Dr. Hilarie Fredrickson please advise.

## 2016-01-03 ENCOUNTER — Other Ambulatory Visit (INDEPENDENT_AMBULATORY_CARE_PROVIDER_SITE_OTHER): Payer: Commercial Managed Care - HMO

## 2016-01-03 DIAGNOSIS — K5190 Ulcerative colitis, unspecified, without complications: Secondary | ICD-10-CM

## 2016-01-03 LAB — BASIC METABOLIC PANEL
BUN: 7 mg/dL (ref 6–23)
CHLORIDE: 99 meq/L (ref 96–112)
CO2: 29 mEq/L (ref 19–32)
CREATININE: 0.81 mg/dL (ref 0.40–1.20)
Calcium: 8.6 mg/dL (ref 8.4–10.5)
GFR: 73.43 mL/min (ref >60.00)
Glucose, Bld: 136 mg/dL — ABNORMAL HIGH (ref 70–99)
POTASSIUM: 3.6 meq/L (ref 3.5–5.1)
Sodium: 134 mEq/L — ABNORMAL LOW (ref 135–145)

## 2016-01-04 ENCOUNTER — Telehealth: Payer: Self-pay | Admitting: Internal Medicine

## 2016-01-04 NOTE — Telephone Encounter (Signed)
Spoke with pts sister and let her know that the records have been sent to [facility] Rheumatology and we are waiting on them to review and precert for Remicade. Let her know we will call their office Monday and check.

## 2016-01-07 ENCOUNTER — Inpatient Hospital Stay (HOSPITAL_COMMUNITY)
Admission: EM | Admit: 2016-01-07 | Discharge: 2016-02-29 | DRG: 330 | Disposition: A | Discharge status: Skilled Nursing Facility | Payer: Commercial Managed Care - HMO | Attending: Internal Medicine | Admitting: Internal Medicine

## 2016-01-07 ENCOUNTER — Emergency Department (HOSPITAL_COMMUNITY): Payer: Commercial Managed Care - HMO

## 2016-01-07 ENCOUNTER — Telehealth: Payer: Self-pay | Admitting: Internal Medicine

## 2016-01-07 ENCOUNTER — Encounter (HOSPITAL_COMMUNITY): Payer: Self-pay

## 2016-01-07 DIAGNOSIS — K51011 Ulcerative (chronic) pancolitis with rectal bleeding: Principal | ICD-10-CM | POA: Diagnosis present

## 2016-01-07 DIAGNOSIS — Z932 Ileostomy status: Secondary | ICD-10-CM | POA: Diagnosis not present

## 2016-01-07 DIAGNOSIS — D649 Anemia, unspecified: Secondary | ICD-10-CM | POA: Diagnosis not present

## 2016-01-07 DIAGNOSIS — K594 Anal spasm: Secondary | ICD-10-CM | POA: Diagnosis present

## 2016-01-07 DIAGNOSIS — D62 Acute posthemorrhagic anemia: Secondary | ICD-10-CM | POA: Diagnosis not present

## 2016-01-07 DIAGNOSIS — R2689 Other abnormalities of gait and mobility: Secondary | ICD-10-CM | POA: Diagnosis not present

## 2016-01-07 DIAGNOSIS — F411 Generalized anxiety disorder: Secondary | ICD-10-CM | POA: Diagnosis not present

## 2016-01-07 DIAGNOSIS — E46 Unspecified protein-calorie malnutrition: Secondary | ICD-10-CM | POA: Diagnosis not present

## 2016-01-07 DIAGNOSIS — K611 Rectal abscess: Secondary | ICD-10-CM | POA: Diagnosis present

## 2016-01-07 DIAGNOSIS — K51911 Ulcerative colitis, unspecified with rectal bleeding: Secondary | ICD-10-CM | POA: Diagnosis not present

## 2016-01-07 DIAGNOSIS — Z7952 Long term (current) use of systemic steroids: Secondary | ICD-10-CM | POA: Diagnosis not present

## 2016-01-07 DIAGNOSIS — K523 Indeterminate colitis: Secondary | ICD-10-CM | POA: Diagnosis not present

## 2016-01-07 DIAGNOSIS — Z6823 Body mass index (BMI) 23.0-23.9, adult: Secondary | ICD-10-CM

## 2016-01-07 DIAGNOSIS — Z882 Allergy status to sulfonamides status: Secondary | ICD-10-CM

## 2016-01-07 DIAGNOSIS — K30 Functional dyspepsia: Secondary | ICD-10-CM | POA: Diagnosis present

## 2016-01-07 DIAGNOSIS — R279 Unspecified lack of coordination: Secondary | ICD-10-CM | POA: Diagnosis not present

## 2016-01-07 DIAGNOSIS — K50114 Crohn's disease of large intestine with abscess: Secondary | ICD-10-CM | POA: Diagnosis not present

## 2016-01-07 DIAGNOSIS — R531 Weakness: Secondary | ICD-10-CM | POA: Diagnosis not present

## 2016-01-07 DIAGNOSIS — R1032 Left lower quadrant pain: Secondary | ICD-10-CM | POA: Diagnosis not present

## 2016-01-07 DIAGNOSIS — Z853 Personal history of malignant neoplasm of breast: Secondary | ICD-10-CM

## 2016-01-07 DIAGNOSIS — Z825 Family history of asthma and other chronic lower respiratory diseases: Secondary | ICD-10-CM

## 2016-01-07 DIAGNOSIS — R109 Unspecified abdominal pain: Secondary | ICD-10-CM | POA: Diagnosis present

## 2016-01-07 DIAGNOSIS — Z85828 Personal history of other malignant neoplasm of skin: Secondary | ICD-10-CM

## 2016-01-07 DIAGNOSIS — R1030 Lower abdominal pain, unspecified: Secondary | ICD-10-CM | POA: Diagnosis not present

## 2016-01-07 DIAGNOSIS — F329 Major depressive disorder, single episode, unspecified: Secondary | ICD-10-CM | POA: Diagnosis present

## 2016-01-07 DIAGNOSIS — K219 Gastro-esophageal reflux disease without esophagitis: Secondary | ICD-10-CM | POA: Diagnosis present

## 2016-01-07 DIAGNOSIS — Z48815 Encounter for surgical aftercare following surgery on the digestive system: Secondary | ICD-10-CM | POA: Diagnosis not present

## 2016-01-07 DIAGNOSIS — I959 Hypotension, unspecified: Secondary | ICD-10-CM | POA: Diagnosis not present

## 2016-01-07 DIAGNOSIS — K51014 Ulcerative (chronic) pancolitis with abscess: Secondary | ICD-10-CM | POA: Diagnosis present

## 2016-01-07 DIAGNOSIS — R262 Difficulty in walking, not elsewhere classified: Secondary | ICD-10-CM

## 2016-01-07 DIAGNOSIS — K612 Anorectal abscess: Secondary | ICD-10-CM | POA: Diagnosis not present

## 2016-01-07 DIAGNOSIS — D696 Thrombocytopenia, unspecified: Secondary | ICD-10-CM | POA: Diagnosis not present

## 2016-01-07 DIAGNOSIS — R140 Abdominal distension (gaseous): Secondary | ICD-10-CM | POA: Diagnosis not present

## 2016-01-07 DIAGNOSIS — Z888 Allergy status to other drugs, medicaments and biological substances status: Secondary | ICD-10-CM

## 2016-01-07 DIAGNOSIS — R1012 Left upper quadrant pain: Secondary | ICD-10-CM | POA: Diagnosis present

## 2016-01-07 DIAGNOSIS — K56609 Unspecified intestinal obstruction, unspecified as to partial versus complete obstruction: Secondary | ICD-10-CM

## 2016-01-07 DIAGNOSIS — D500 Iron deficiency anemia secondary to blood loss (chronic): Secondary | ICD-10-CM | POA: Diagnosis not present

## 2016-01-07 DIAGNOSIS — E876 Hypokalemia: Secondary | ICD-10-CM

## 2016-01-07 DIAGNOSIS — F418 Other specified anxiety disorders: Secondary | ICD-10-CM | POA: Diagnosis not present

## 2016-01-07 DIAGNOSIS — K50919 Crohn's disease, unspecified, with unspecified complications: Secondary | ICD-10-CM | POA: Diagnosis not present

## 2016-01-07 DIAGNOSIS — K626 Ulcer of anus and rectum: Secondary | ICD-10-CM | POA: Diagnosis present

## 2016-01-07 DIAGNOSIS — H9190 Unspecified hearing loss, unspecified ear: Secondary | ICD-10-CM | POA: Diagnosis present

## 2016-01-07 DIAGNOSIS — K625 Hemorrhage of anus and rectum: Secondary | ICD-10-CM | POA: Diagnosis not present

## 2016-01-07 DIAGNOSIS — R5381 Other malaise: Secondary | ICD-10-CM | POA: Diagnosis present

## 2016-01-07 DIAGNOSIS — R404 Transient alteration of awareness: Secondary | ICD-10-CM | POA: Diagnosis not present

## 2016-01-07 DIAGNOSIS — R509 Fever, unspecified: Secondary | ICD-10-CM | POA: Diagnosis not present

## 2016-01-07 DIAGNOSIS — D519 Vitamin B12 deficiency anemia, unspecified: Secondary | ICD-10-CM | POA: Diagnosis not present

## 2016-01-07 DIAGNOSIS — R110 Nausea: Secondary | ICD-10-CM | POA: Diagnosis not present

## 2016-01-07 DIAGNOSIS — Z79899 Other long term (current) drug therapy: Secondary | ICD-10-CM

## 2016-01-07 DIAGNOSIS — K529 Noninfective gastroenteritis and colitis, unspecified: Secondary | ICD-10-CM | POA: Diagnosis not present

## 2016-01-07 DIAGNOSIS — R1084 Generalized abdominal pain: Secondary | ICD-10-CM | POA: Diagnosis not present

## 2016-01-07 DIAGNOSIS — K50119 Crohn's disease of large intestine with unspecified complications: Secondary | ICD-10-CM

## 2016-01-07 DIAGNOSIS — K604 Rectal fistula: Secondary | ICD-10-CM | POA: Diagnosis not present

## 2016-01-07 DIAGNOSIS — R112 Nausea with vomiting, unspecified: Secondary | ICD-10-CM | POA: Diagnosis not present

## 2016-01-07 DIAGNOSIS — Z808 Family history of malignant neoplasm of other organs or systems: Secondary | ICD-10-CM | POA: Diagnosis not present

## 2016-01-07 DIAGNOSIS — K589 Irritable bowel syndrome without diarrhea: Secondary | ICD-10-CM | POA: Diagnosis present

## 2016-01-07 DIAGNOSIS — M6281 Muscle weakness (generalized): Secondary | ICD-10-CM | POA: Diagnosis not present

## 2016-01-07 DIAGNOSIS — R159 Full incontinence of feces: Secondary | ICD-10-CM | POA: Diagnosis present

## 2016-01-07 DIAGNOSIS — E538 Deficiency of other specified B group vitamins: Secondary | ICD-10-CM | POA: Diagnosis not present

## 2016-01-07 DIAGNOSIS — Z885 Allergy status to narcotic agent status: Secondary | ICD-10-CM

## 2016-01-07 DIAGNOSIS — F0631 Mood disorder due to known physiological condition with depressive features: Secondary | ICD-10-CM

## 2016-01-07 DIAGNOSIS — K6289 Other specified diseases of anus and rectum: Secondary | ICD-10-CM | POA: Diagnosis present

## 2016-01-07 DIAGNOSIS — K9189 Other postprocedural complications and disorders of digestive system: Secondary | ICD-10-CM

## 2016-01-07 DIAGNOSIS — K50111 Crohn's disease of large intestine with rectal bleeding: Secondary | ICD-10-CM | POA: Diagnosis not present

## 2016-01-07 DIAGNOSIS — K922 Gastrointestinal hemorrhage, unspecified: Secondary | ICD-10-CM | POA: Diagnosis present

## 2016-01-07 DIAGNOSIS — R197 Diarrhea, unspecified: Secondary | ICD-10-CM | POA: Diagnosis not present

## 2016-01-07 DIAGNOSIS — K633 Ulcer of intestine: Secondary | ICD-10-CM | POA: Diagnosis not present

## 2016-01-07 DIAGNOSIS — M069 Rheumatoid arthritis, unspecified: Secondary | ICD-10-CM | POA: Diagnosis not present

## 2016-01-07 DIAGNOSIS — R000 Tachycardia, unspecified: Secondary | ICD-10-CM | POA: Diagnosis not present

## 2016-01-07 DIAGNOSIS — K5190 Ulcerative colitis, unspecified, without complications: Secondary | ICD-10-CM | POA: Diagnosis not present

## 2016-01-07 DIAGNOSIS — K567 Ileus, unspecified: Secondary | ICD-10-CM | POA: Diagnosis not present

## 2016-01-07 DIAGNOSIS — Z79891 Long term (current) use of opiate analgesic: Secondary | ICD-10-CM

## 2016-01-07 DIAGNOSIS — R5081 Fever presenting with conditions classified elsewhere: Secondary | ICD-10-CM | POA: Diagnosis not present

## 2016-01-07 HISTORY — DX: Crohn's disease of large intestine with abscess: K50.114

## 2016-01-07 HISTORY — DX: Malignant neoplasm of lower-inner quadrant of left female breast: C50.312

## 2016-01-07 LAB — CBC WITH DIFFERENTIAL/PLATELET
BASOS ABS: 0 10*3/uL (ref 0.0–0.1)
Basophils Relative: 0 %
EOS ABS: 0.1 10*3/uL (ref 0.0–0.7)
Eosinophils Relative: 1 %
HCT: 43.9 % (ref 36.0–46.0)
Hemoglobin: 14.5 g/dL (ref 12.0–15.0)
LYMPHS ABS: 1.9 10*3/uL (ref 0.7–4.0)
LYMPHS PCT: 23 %
MCH: 30.9 pg (ref 26.0–34.0)
MCHC: 33 g/dL (ref 30.0–36.0)
MCV: 93.6 fL (ref 78.0–100.0)
MONO ABS: 0.9 10*3/uL (ref 0.1–1.0)
Monocytes Relative: 11 %
NEUTROS PCT: 65 %
Neutro Abs: 5.2 10*3/uL (ref 1.7–7.7)
Platelets: 419 10*3/uL — ABNORMAL HIGH (ref 150–400)
RBC: 4.69 MIL/uL (ref 3.87–5.11)
RDW: 12.4 % (ref 11.5–15.5)
WBC: 8.1 10*3/uL (ref 4.0–10.5)

## 2016-01-07 LAB — C DIFFICILE QUICK SCREEN W PCR REFLEX
C DIFFICILE (CDIFF) TOXIN: NEGATIVE
C Diff antigen: NEGATIVE
C Diff interpretation: NOT DETECTED

## 2016-01-07 LAB — BASIC METABOLIC PANEL
Anion gap: 10 (ref 5–15)
BUN: 9 mg/dL (ref 6–20)
CHLORIDE: 103 mmol/L (ref 101–111)
CO2: 26 mmol/L (ref 22–32)
CREATININE: 0.74 mg/dL (ref 0.44–1.00)
Calcium: 8.6 mg/dL — ABNORMAL LOW (ref 8.9–10.3)
GFR calc Af Amer: >60 mL/min (ref >60)
GFR calc non Af Amer: >60 mL/min (ref >60)
Glucose, Bld: 104 mg/dL — ABNORMAL HIGH (ref 65–99)
POTASSIUM: 3.5 mmol/L (ref 3.5–5.1)
Sodium: 139 mmol/L (ref 135–145)

## 2016-01-07 MED ORDER — SODIUM CHLORIDE 0.9 % IV BOLUS (SEPSIS)
2000.0000 mL | Freq: Once | INTRAVENOUS | Status: AC
  Administered 2016-01-07: 2000 mL via INTRAVENOUS

## 2016-01-07 MED ORDER — ENOXAPARIN SODIUM 40 MG/0.4ML ~~LOC~~ SOLN
40.0000 mg | SUBCUTANEOUS | Status: DC
  Administered 2016-01-07 – 2016-01-25 (×17): 40 mg via SUBCUTANEOUS
  Filled 2016-01-07 (×21): qty 0.4

## 2016-01-07 MED ORDER — METOCLOPRAMIDE HCL 5 MG/ML IJ SOLN
10.0000 mg | Freq: Once | INTRAMUSCULAR | Status: AC
  Administered 2016-01-07: 10 mg via INTRAVENOUS
  Filled 2016-01-07: qty 2

## 2016-01-07 MED ORDER — ZOLPIDEM TARTRATE 5 MG PO TABS
5.0000 mg | ORAL_TABLET | Freq: Every evening | ORAL | Status: DC | PRN
  Administered 2016-01-07 – 2016-02-28 (×49): 5 mg via ORAL
  Filled 2016-01-07 (×50): qty 1

## 2016-01-07 MED ORDER — HYDROMORPHONE HCL 1 MG/ML IJ SOLN
1.0000 mg | INTRAMUSCULAR | Status: DC | PRN
Start: 2016-01-07 — End: 2016-01-11
  Administered 2016-01-07 – 2016-01-11 (×21): 1 mg via INTRAVENOUS
  Filled 2016-01-07 (×21): qty 1

## 2016-01-07 MED ORDER — DICYCLOMINE HCL 10 MG PO CAPS
10.0000 mg | ORAL_CAPSULE | Freq: Three times a day (TID) | ORAL | Status: DC
  Administered 2016-01-07 – 2016-02-29 (×191): 10 mg via ORAL
  Filled 2016-01-07 (×201): qty 1

## 2016-01-07 MED ORDER — ONDANSETRON HCL 4 MG/2ML IJ SOLN
4.0000 mg | Freq: Four times a day (QID) | INTRAMUSCULAR | Status: DC | PRN
  Administered 2016-01-08 – 2016-02-27 (×21): 4 mg via INTRAVENOUS
  Filled 2016-01-07 (×25): qty 2

## 2016-01-07 MED ORDER — HYDROMORPHONE HCL 1 MG/ML IJ SOLN
1.0000 mg | Freq: Once | INTRAMUSCULAR | Status: AC
  Administered 2016-01-07: 1 mg via INTRAVENOUS
  Filled 2016-01-07: qty 1

## 2016-01-07 MED ORDER — CHLORHEXIDINE GLUCONATE 0.12 % MT SOLN
15.0000 mL | Freq: Two times a day (BID) | OROMUCOSAL | Status: DC
Start: 2016-01-07 — End: 2016-01-27
  Administered 2016-01-07 – 2016-01-26 (×18): 15 mL via OROMUCOSAL
  Filled 2016-01-07 (×28): qty 15

## 2016-01-07 MED ORDER — HYDROMORPHONE HCL 1 MG/ML IJ SOLN
1.0000 mg | INTRAMUSCULAR | Status: DC | PRN
  Administered 2016-01-07: 1 mg via INTRAVENOUS
  Filled 2016-01-07 (×2): qty 1

## 2016-01-07 MED ORDER — METHYLPREDNISOLONE SODIUM SUCC 40 MG IJ SOLR
30.0000 mg | Freq: Two times a day (BID) | INTRAMUSCULAR | Status: DC
  Administered 2016-01-07 – 2016-01-22 (×31): 30 mg via INTRAVENOUS
  Filled 2016-01-07 (×30): qty 1

## 2016-01-07 NOTE — H&P (Signed)
History and Physical    Sara Barnes KPT:[id number] DOB: January 20, 1941 DOA: 01/07/2016  PCP: Ann Held, DO  Outpatient Specialists: GI, [place] Patient coming from: home  Chief Complaint: Abdominal pain  HPI: Sara Barnes is a 75 y.o. female with medical history significant of ulcerative colitis versus Crohn's disease, believed to be UC eventually, otherwise healthy, presents to the emergency room with chief complaint of severe abdominal pain for the past several days. She was recently hospitalized and discharged home about 9 days ago. She was here in the hospital with similar complaints, placed on steroids, and tells me today that she improved some and really wanted to go home. At home, she has had ongoing symptoms, never really well achieved full remission, and over the last few days she has felt extremely unwell. She has crampy abdominal pain, nausea, poor appetite, barely able to keep down some Gatorade, and has been having significant amounts of bloody bowel movements as well. She has no fever or chills. She has no chest pain or shortness of breath. She denies any palpitations. She has no lightheadedness or dizziness. She feels generalized weakness. In the emergency room her vital signs are stable, her blood work is essentially unremarkable. Gastroenterology was consulted by EDP and have evaluated patient, and TR[facility] was asked for admission  Review of Systems: As per HPI otherwise 10 point review of systems negative.   Past Medical History:  Diagnosis Date  . Basal cell carcinoma   . 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 Complication of anesthesia    anxiety post-op after ear surgeries  . Dental crowns present    recent root canal 11/2014  . Family history of adverse reaction to anesthesia    pt's sister has hx. of post-op N/V  . Indigestion   . PONV (postoperative nausea and vomiting)   . Runny nose 12/07/2014   clear drainage, per pt.  .  Seasonal allergies   . Ulcerative colitis   . Vitamin B12 deficiency     Past Surgical History:  Procedure Laterality Date  . ABDOMINAL HYSTERECTOMY     partial  . APPENDECTOMY    . BASAL CELL CARCINOMA EXCISION Left    nose  . BREAST ENHANCEMENT SURGERY    . BREAST LUMPECTOMY Left    age 15's  . BREAST LUMPECTOMY WITH RADIOACTIVE SEED AND SENTINEL LYMPH NODE BIOPSY Left 12/13/2014   Procedure: LEFT BREAST LUMPECTOMY WITH RADIOACTIVE SEED AND SENTINEL LYMPH NODE BIOPSY;  Surgeon: [name], MD;  Location: [facility];  Service: General;  Laterality: Left;  . COLONOSCOPY WITH PROPOFOL  05/28/2011; 06/28/2014  . INCISION AND DRAINAGE PERIRECTAL ABSCESS N/A 09/28/2013   Procedure: IRRIGATION AND DEBRIDEMENT PERIANAL ABSCESS, proctoscopy;  Surgeon: Shann Medal, MD;  Location: [facility];  Service: General;  Laterality: N/A;  . INNER EAR SURGERY Bilateral      reports that she has never smoked. She has never used smokeless tobacco. She reports that she drinks alcohol. She reports that she does not use drugs.  Allergies  Allergen Reactions  . Codeine Nausea And Vomiting  . Mesalamine Nausea And Vomiting  . Other Itching    PERFUMED LOTIONS  Cant have MRI due to ear implant   . Oxycodone-Acetaminophen Rash    Itching & rash  . Sulfa Antibiotics Rash    Family History  Problem Relation Age of Onset  . Prostate cancer Brother 77  . Kidney disease Brother   .  Diabetes Brother   . Brain cancer Father   . Rheum arthritis Mother   . Anesthesia problems Sister     post-op N/V  . Emphysema Maternal Grandfather   . Asthma Sister   . Rheum arthritis Brother   . Colon cancer Neg Hx   . Esophageal cancer Neg Hx   . Breast cancer Neg Hx     Prior to Admission medications   Medication Sig Start Date End Date Taking? Authorizing Provider  acetaminophen (TYLENOL) 500 MG tablet Take 1,000 mg by mouth every 6 (six) hours as needed for mild pain, moderate pain, fever or  headache.   Yes Historical Provider, MD  acetaminophen (TYLENOL) 650 MG CR tablet Take 650 mg by mouth every 8 (eight) hours as needed for pain.   Yes Historical Provider, MD  FLUoxetine (PROZAC) 20 MG capsule Take 1 capsule (20 mg total) by mouth daily. 10/11/15  Yes Yvonne R Lowne Chase, DO  belladonna-PHENObarbital (DONNATAL) 16.2 MG/5ML ELIX Take 5 mLs (16.2 mg total) by mouth 3 (three) times daily. 12/29/15   Velvet Bathe, MD  cyanocobalamin (,VITAMIN B-12,) 1000 MCG/ML injection INJECT 1 ML (1,000 MCG TOTAL) INTO THE MUSCLE EVERY 30 (THIRTY) DAYS. 09/11/15   Jerene Bears, MD  fluticasone (FLONASE) 50 MCG/ACT nasal spray Place 2 sprays into both nostrils daily. Patient taking differently: Place 2 sprays into both nostrils daily as needed for allergies or rhinitis.  06/15/15   Rosalita Chessman Chase, DO  HYDROcodone-acetaminophen (NORCO/VICODIN) 5-325 MG tablet Take 2 tablets by mouth every 4 (four) hours as needed. 01/02/16   Jerene Bears, MD  hydrocortisone (ANUSOL-HC) 25 MG suppository Place 1 suppository (25 mg total) rectally daily. 12/29/15   Velvet Bathe, MD  mesalamine (CANASA) 1000 MG suppository Place 1 suppository (1,000 mg total) rectally at bedtime. 12/29/15   Velvet Bathe, MD  ondansetron (ZOFRAN) 4 MG tablet Take 1 tablet (4 mg total) by mouth every 8 (eight) hours as needed for nausea or vomiting. 01/01/16   Courteney Lyn Mackuen, MD  potassium chloride 20 MEQ TBCR Take 20 mEq by mouth daily. 12/30/15 01/09/16  Velvet Bathe, MD  predniSONE (DELTASONE) 20 MG tablet Take 2 tablets (40 mg total) by mouth daily with breakfast. 12/29/15 01/14/16  Velvet Bathe, MD  zolpidem (AMBIEN) 5 MG tablet Take 1 tablet (5 mg total) by mouth at bedtime as needed. Patient taking differently: Take 5 mg by mouth at bedtime as needed for sleep.  12/11/15   Ann Held, DO    Physical Exam: Vitals:   01/07/16 1132 01/07/16 1135 01/07/16 1136 01/07/16 1330  BP:  146/77  141/76  Pulse:  72  69  Resp:  20  16    Temp:  98 F (36.7 C)    TempSrc:  Oral    SpO2: 98% 99%  96%  Weight:   72.6 kg (160 lb)   Height:   5' 6"  (1.676 m)       Constitutional: Appears uncomfortable and in distress  Vitals:   01/07/16 1132 01/07/16 1135 01/07/16 1136 01/07/16 1330  BP:  146/77  141/76  Pulse:  72  69  Resp:  20  16  Temp:  98 F (36.7 C)    TempSrc:  Oral    SpO2: 98% 99%  96%  Weight:   72.6 kg (160 lb)   Height:   5' 6"  (1.676 m)    Eyes: PERRL ENMT: Mucous membranes are moist.   Neck: normal, supple  Respiratory: clear to auscultation bilaterally, no wheezing, no crackles. Normal respiratory effort. No accessory muscle use.  Cardiovascular: Regular rate and rhythm, no murmurs / rubs / gallops. No extremity edema. 2+ pedal pulses.  Abdomen: Tenderness to palpation throughout, no rebound, no guarding. Bowel sounds positive.   Musculoskeletal: no clubbing / cyanosis. Normal muscle tone.  Skin: no rashes, lesions, ulcers. No induration Neurologic: CN 2-12 grossly intact. Strength 5/5 in all 4.  Psychiatric: Normal judgment and insight. Alert and oriented x 3. Normal mood.   Labs on Admission: I have personally reviewed following labs and imaging studies  CBC:  Recent Labs Lab 01/01/16 1153 01/07/16 1358  WBC 12.9* 8.1  NEUTROABS  --  5.2  HGB 13.1 14.5  HCT 38.2 43.9  MCV 91.6 93.6  PLT 280 390*   Basic Metabolic Panel:  Recent Labs Lab 01/01/16 1153 01/03/16 1257 01/07/16 1358  NA 138 134* 139  K 3.3* 3.6 3.5  CL 104 99 103  CO2 24 29 26   GLUCOSE 111* 136* 104*  BUN 6 7 9   CREATININE 0.55 0.81 0.74  CALCIUM 8.5* 8.6 8.6*   GFR: Estimated Creatinine Clearance: 62.9 mL/min (by C-G formula based on SCr of 0.8 mg/dL). Liver Function Tests:  Recent Labs Lab 01/01/16 1153  AST 9*  ALT 9*  ALKPHOS 74  BILITOT 0.7  PROT 6.4*  ALBUMIN 3.0*   No results for input(s): LIPASE, AMYLASE in the last 168 hours. No results for input(s): AMMONIA in the last 168  hours. Coagulation Profile:  Recent Labs Lab 01/01/16 1153  INR 1.17   Cardiac Enzymes: No results for input(s): CKTOTAL, CKMB, CKMBINDEX, TROPONINI in the last 168 hours. BNP (last 3 results) No results for input(s): PROBNP in the last 8760 hours. HbA1C: No results for input(s): HGBA1C in the last 72 hours. CBG: No results for input(s): GLUCAP in the last 168 hours. Lipid Profile: No results for input(s): CHOL, HDL, LDLCALC, TRIG, CHOLHDL, LDLDIRECT in the last 72 hours. Thyroid Function Tests: No results for input(s): TSH, T4TOTAL, FREET4, T3FREE, THYROIDAB in the last 72 hours. Anemia Panel: No results for input(s): VITAMINB12, FOLATE, FERRITIN, TIBC, IRON, RETICCTPCT in the last 72 hours. Urine analysis:    Component Value Date/Time   COLORURINE YELLOW 04/19/2015 0838   APPEARANCEUR CLOUDY (A) 04/19/2015 0838   LABSPEC 1.013 04/19/2015 0838   PHURINE 6.0 04/19/2015 0838   GLUCOSEU NEGATIVE 04/19/2015 0838   HGBUR MODERATE (A) 04/19/2015 0838   HGBUR small 12/13/2009 0904   BILIRUBINUR NEGATIVE 04/19/2015 0838   BILIRUBINUR ne 01/30/2015 1543   KETONESUR 40 (A) 04/19/2015 0838   PROTEINUR NEGATIVE 04/19/2015 0838   UROBILINOGEN 0.2 01/30/2015 1543   UROBILINOGEN 0.2 09/25/2013 1350   NITRITE NEGATIVE 04/19/2015 0838   LEUKOCYTESUR TRACE (A) 04/19/2015 0838   Sepsis Labs: @LABRCNTIP (procalcitonin:4,lacticidven:4) )No results found for this or any previous visit (from the past 240 hour(s)).   Radiological Exams on Admission: Dg Abd Acute W/chest  Result Date: 01/07/2016 CLINICAL DATA:  Bloody diarrhea and left lower quadrant abdominal pain as well as rectal pain without fever or nausea and vomiting. Patient recently hospitalized for colitis. EXAM: DG ABDOMEN ACUTE W/ 1V CHEST COMPARISON:  CT scan of the chest of January 01, 2016 FINDINGS: Chest x-ray: The lungs are adequately inflated and clear. The patient has undergone previous breast surgery for malignancy. There  is no alveolar infiltrate or pleural effusion. The heart and pulmonary vascularity are normal. The bony structures exhibit no acute abnormality. Within the  abdomen there are small amounts of gas within multiple normal caliber and an minimally distended small bowel loops. There is gas in the ascending colon. No rectal gas is observed. No free extraluminal gas collections are demonstrated. The bony structures exhibit no acute abnormalities. There are no abnormal soft tissue calcifications. IMPRESSION: The bowel gas pattern suggests an ileus or gastroenteritis type process. There is no evidence of obstruction or perforation. No acute cardiopulmonary abnormality. Electronically Signed   By: David  Martinique M.D.   On: 01/07/2016 13:49   Assessment/Plan Active Problems:   Ulcerative pancolitis with rectal bleeding (HCC)   Abdominal pain   GI bleed   Abdominal pain in the setting of UC flare - GI consulted, appreciate input, patient started on IV steroids - defer further management to gastroenterology, plan to start Biologics  Diarrhea - Likely related to #1, check C. difficile and gastrointestinal panel though  GI bleed  - Likely in setting of #1, hemoglobin stable, continue to monitor    DVT prophylaxis: SCD  Code Status: Full  Family Communication: sister bedside Disposition Plan: admit to [facility] called: GI  Admission status: observation    Marzetta Board, MD [facility] Pager [phone]  If 7PM-7AM, please contact night-coverage [email or web address] Password [facility]  01/07/2016, 4:19 PM

## 2016-01-07 NOTE — ED Notes (Signed)
FIRST ATTEMPT TO CALL REPORT TO 5E 1512-1.

## 2016-01-07 NOTE — Progress Notes (Signed)
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

## 2016-01-07 NOTE — ED Triage Notes (Signed)
PT [facility] C/O COLITIS FLARE-UP SINCE Friday. PT STS SHE WAS RELEASED FROM [facility] ON Thursday, BUT CONTINUED TO HAVE BLOODY DIARRHEA. PT C/O LLQ ABDOMINAL PAIN AND RECTAL PAIN 10/10. DENIES FEVER, N/V.

## 2016-01-07 NOTE — Telephone Encounter (Signed)
Spoke with pts son and he states his mother is worse than she was last week. States she ran out of pain meds over the weekend and is in  A lot of pain. States he thinks she should be hospitalized. Instructed him to take her to the ER to be evaluated. [name] PA aware.

## 2016-01-07 NOTE — Consult Note (Signed)
Consultation  Referring Provider: Dr Cathleen Fears Primary Care Physician:  Ann Held, DO Primary Gastroenterologist:  Dr.Pyrtle  Reason for Consultation:  IBD exacerbation  HPI: Sara Barnes is a 75 y.o. female known to Dr. Hilarie Fredrickson who has had long-standing history of IBD which previously had been ulcerative colitis versus Crohn's. Patient had colonoscopy in May 2017 with finding of moderate colitis from the rectum to the transverse colon and also had one polyp removed. Biopsies at that time were consistent with active ulcerative colitis. She has required intermittent steroids over the past year and is also had several hospitalizations over the past 2 years due to poorly controlled disease. She is intolerant to mesalamine's, and has been very reluctant to escalate therapy to Biologics. She does have history of breast cancer in 2016. Patient was admitted from our office on 12/24/2015 through 12/29/2015 with an exacerbation with abdominal cramping diarrhea with mucoid bloody stools and rectal pain. She was started on IV steroids and discharged home on 40 of prednisone daily. She did have GI pathogen panel and C. difficile done during hospitalization both of which were negative. At that point she was apparently agreeable to starting Remicade which has been discussed on several occasions in the past and this was in process of being approved. She had an ER visit, on 01/01/2016 with increased complaints of abdominal pain and profuse diarrhea apparently had been having episodes of incontinence. She underwent CT of the abdomen and pelvis which showed improved appearance of her colitis with persistent wall thickening of the descending colon. She was continued on prednisone. Her family called the office today and was advised to take her to the emergency room as she is complaining of increased abdominal pain diarrhea, rectal pain and mucoid bloody stools. Labs in the ER today with WBC of 12.4,  hemoglobin 12.1 hematocrit of 36.5 Patient had QuantiFERON Gold done previously and hepatitis serologies have been negative. She also had a CMV DNA by PCR done on 12/25/2015 and this was negative. Patient and sister say that she has been miserable since discharge to home on 12/29/2015. She continues on 40 mg of prednisone daily. She was not discharged home with any pain medication or anti-spasmodic. She has been incontinent of STOOLS. She says she has an urge for bowel movement but absolutely no warning and has been having at least 12 liquidy and bloody bowel movements she complains primarily of severe rectal pain and also has had lower abdominal pain but to a much lesser extent. Appetite has been poor and intake has been minimal. No fever or chills.    Past Medical History:  Diagnosis Date  . Basal cell carcinoma   . Cancer of lower-inner quadrant of female breast ([place]) 11/30/2014   left  . Complication of anesthesia    anxiety post-op after ear surgeries  . Dental crowns present    recent root canal 11/2014  . Family history of adverse reaction to anesthesia    pt's sister has hx. of post-op N/V  . Indigestion   . PONV (postoperative nausea and vomiting)   . Runny nose 12/07/2014   clear drainage, per pt.  . Seasonal allergies   . Ulcerative colitis   . Vitamin B12 deficiency     Past Surgical History:  Procedure Laterality Date  . ABDOMINAL HYSTERECTOMY     partial  . APPENDECTOMY    . BASAL CELL CARCINOMA EXCISION Left    nose  . BREAST ENHANCEMENT SURGERY    .  BREAST LUMPECTOMY Left    age 30's  . BREAST LUMPECTOMY WITH RADIOACTIVE SEED AND SENTINEL LYMPH NODE BIOPSY Left 12/13/2014   Procedure: LEFT BREAST LUMPECTOMY WITH RADIOACTIVE SEED AND SENTINEL LYMPH NODE BIOPSY;  Surgeon: [name], MD;  Location: [facility];  Service: General;  Laterality: Left;  . COLONOSCOPY WITH PROPOFOL  05/28/2011; 06/28/2014  . INCISION AND DRAINAGE PERIRECTAL ABSCESS N/A 09/28/2013    Procedure: IRRIGATION AND DEBRIDEMENT PERIANAL ABSCESS, proctoscopy;  Surgeon: Shann Medal, MD;  Location: [facility];  Service: General;  Laterality: N/A;  . INNER EAR SURGERY Bilateral     Prior to Admission medications   Medication Sig Start Date End Date Taking? Authorizing Provider  acetaminophen (TYLENOL) 325 MG tablet Take 325-650 mg by mouth every 6 (six) hours as needed for moderate pain. Reported on 09/25/2015    Historical Provider, MD  belladonna-PHENObarbital (DONNATAL) 16.2 MG/5ML ELIX Take 5 mLs (16.2 mg total) by mouth 3 (three) times daily. 12/29/15   Velvet Bathe, MD  cyanocobalamin (,VITAMIN B-12,) 1000 MCG/ML injection INJECT 1 ML (1,000 MCG TOTAL) INTO THE MUSCLE EVERY 30 (THIRTY) DAYS. 09/11/15   Jerene Bears, MD  FLUoxetine (PROZAC) 20 MG capsule Take 1 capsule (20 mg total) by mouth daily. 10/11/15   Alferd Apa Lowne Chase, DO  fluticasone (FLONASE) 50 MCG/ACT nasal spray Place 2 sprays into both nostrils daily. Patient taking differently: Place 2 sprays into both nostrils daily as needed for allergies or rhinitis.  06/15/15   Rosalita Chessman Chase, DO  HYDROcodone-acetaminophen (NORCO/VICODIN) 5-325 MG tablet Take 2 tablets by mouth every 4 (four) hours as needed. 01/02/16   Jerene Bears, MD  hydrocortisone (ANUSOL-HC) 25 MG suppository Place 1 suppository (25 mg total) rectally daily. 12/29/15   Velvet Bathe, MD  mesalamine (CANASA) 1000 MG suppository Place 1 suppository (1,000 mg total) rectally at bedtime. 12/29/15   Velvet Bathe, MD  ondansetron (ZOFRAN) 4 MG tablet Take 1 tablet (4 mg total) by mouth every 8 (eight) hours as needed for nausea or vomiting. 01/01/16   Courteney Lyn Mackuen, MD  potassium chloride 20 MEQ TBCR Take 20 mEq by mouth daily. 12/30/15 01/09/16  Velvet Bathe, MD  predniSONE (DELTASONE) 20 MG tablet Take 2 tablets (40 mg total) by mouth daily with breakfast. 12/29/15 01/14/16  Velvet Bathe, MD  zolpidem (AMBIEN) 5 MG tablet Take 1 tablet (5 mg total) by mouth at  bedtime as needed. Patient taking differently: Take 5 mg by mouth at bedtime as needed for sleep.  12/11/15   Rosalita Chessman Chase, DO    No current facility-administered medications for this encounter.    Current Outpatient Prescriptions  Medication Sig Dispense Refill  . acetaminophen (TYLENOL) 325 MG tablet Take 325-650 mg by mouth every 6 (six) hours as needed for moderate pain. Reported on 09/25/2015    . belladonna-PHENObarbital (DONNATAL) 16.2 MG/5ML ELIX Take 5 mLs (16.2 mg total) by mouth 3 (three) times daily. 600 mL 0  . cyanocobalamin (,VITAMIN B-12,) 1000 MCG/ML injection INJECT 1 ML (1,000 MCG TOTAL) INTO THE MUSCLE EVERY 30 (THIRTY) DAYS. 12 mL 0  . FLUoxetine (PROZAC) 20 MG capsule Take 1 capsule (20 mg total) by mouth daily. 90 capsule 1  . fluticasone (FLONASE) 50 MCG/ACT nasal spray Place 2 sprays into both nostrils daily. (Patient taking differently: Place 2 sprays into both nostrils daily as needed for allergies or rhinitis. ) 16 g 6  . HYDROcodone-acetaminophen (NORCO/VICODIN) 5-325 MG tablet Take 2 tablets by  mouth every 4 (four) hours as needed. 30 tablet 0  . hydrocortisone (ANUSOL-HC) 25 MG suppository Place 1 suppository (25 mg total) rectally daily. 12 suppository 0  . mesalamine (CANASA) 1000 MG suppository Place 1 suppository (1,000 mg total) rectally at bedtime. 30 suppository 12  . ondansetron (ZOFRAN) 4 MG tablet Take 1 tablet (4 mg total) by mouth every 8 (eight) hours as needed for nausea or vomiting. 11 tablet 0  . potassium chloride 20 MEQ TBCR Take 20 mEq by mouth daily. 20 tablet 0  . predniSONE (DELTASONE) 20 MG tablet Take 2 tablets (40 mg total) by mouth daily with breakfast. 36 tablet 0  . zolpidem (AMBIEN) 5 MG tablet Take 1 tablet (5 mg total) by mouth at bedtime as needed. (Patient taking differently: Take 5 mg by mouth at bedtime as needed for sleep. ) 30 tablet 2    Allergies as of 01/07/2016 - Review Complete 01/07/2016  Allergen Reaction Noted    . Codeine Nausea And Vomiting 01/20/2008  . Mesalamine Nausea And Vomiting 05/13/2011  . Other Itching 12/07/2014  . Oxycodone-acetaminophen Rash 01/04/2015  . Sulfa antibiotics Rash 12/07/2014    Family History  Problem Relation Age of Onset  . Prostate cancer Brother 72  . Kidney disease Brother   . Diabetes Brother   . Brain cancer Father   . Rheum arthritis Mother   . Anesthesia problems Sister     post-op N/V  . Emphysema Maternal Grandfather   . Asthma Sister   . Rheum arthritis Brother   . Colon cancer Neg Hx   . Esophageal cancer Neg Hx   . Breast cancer Neg Hx     Social History   Social History  . Marital status: Widowed    Spouse name: N/A  . Number of children: N/A  . Years of education: N/A   Occupational History  . 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 Smoking status: Never Smoker  . Smokeless tobacco: Never Used  . Alcohol use 0.0 oz/week     Comment: daily wine with dinner  . Drug use: No  . Sexual activity: Not Currently    Partners: Male   Other Topics Concern  . Not on file   Social History Narrative   Treadmill/ walking--exercise    Review of Systems: Pertinent positive and negative review of systems were noted in the above HPI section.  All other review of systems was otherwise negative.  Physical Exam: Vital signs in last 24 hours: Temp:  [98 F (36.7 C)] 98 F (36.7 C) (09/11 1135) Pulse Rate:  [69-72] 69 (09/11 1330) Resp:  [16-20] 16 (09/11 1330) BP: (141-146)/(76-77) 141/76 (09/11 1330) SpO2:  [96 %-99 %] 96 % (09/11 1330) Weight:  [160 lb (72.6 kg)] 160 lb (72.6 kg) (09/11 1136)   General:   Alert,  Well-developed, well-nourished,older WF  pleasant and cooperative in NAD, uncomfortable Head:  Normocephalic and atraumatic. Eyes:  Sclera clear, no icterus.   Conjunctiva pink. Ears:  Normal auditory acuity. Nose:  No deformity, discharge,  or lesions. Mouth:  No deformity or lesions.    Neck:  Supple; no masses or thyromegaly. Lungs:  Clear throughout to auscultation.   No wheezes, crackles, or rhonchi.  Heart:  Regular rate and rhythm; no murmurs, clicks, rubs,  or gallops. Abdomen:  Soft,mildly tender across lower abdomen left greater than right , BS active,nonpalp mass or hsm.   Rectal:  Deferred  digital no external abnormality, no evidence for perirectal disease or abscess Msk:  Symmetrical without gross deformities. . Pulses:  Normal pulses noted. Extremities:  Without clubbing or edema. Neurologic:  Alert and  oriented x4;  grossly normal neurologically. Skin:  Intact without significant lesions or rashes.. Psych:  Alert and cooperative. Normal mood and affect.  Intake/Output from previous day: No intake/output data recorded. Intake/Output this shift: No intake/output data recorded.  Lab Results: No results for input(s): WBC, HGB, HCT, PLT in the last 72 hours. BMET  Recent Labs  01/07/16 1358  NA 139  K 3.5  CL 103  CO2 26  GLUCOSE 104*  BUN 9  CREATININE 0.74  CALCIUM 8.6*   LFT No results for input(s): PROT, ALBUMIN, AST, ALT, ALKPHOS, BILITOT, BILIDIR, IBILI in the last 72 hours. PT/INR No results for input(s): LABPROT, INR in the last 72 hours. Hepatitis Panel No results for input(s): HEPBSAG, HCVAB, HEPAIGM, HEPBIGM in the last 72 hours.    IMPRESSION:  #38 75 year old white female with poorly controlled ulcerative colitis who has been in and out of the hospital over the past year and requiring repeated courses of steroids. Over the past several weeks she has been refractory to 40 mg of prednisone daily and continues to have multiple bloody mucoid diarrheal stools per day with incontinence and tenesmus with complaints of ongoing severe rectal pain. Imaging as of 01/01/2016 shows no evidence of perirectal abscess but does have thickening down into the rectum. Recent GI  Panel and stool for C. difficile as of 10 days ago negative #2 history  of breast cancer 2016 status post lumpectomy and radiation # 3 history of basal cell skin cancer #4  anxiety and depression  PLAN: Clear liquid diet IV Solu-Medrol 30 mg every 12 hours We will repeat stool for C. difficile by PCR Adequate pain management with Dilaudid 1 mg every 3 hours when necessary Antiemetics as needed Bentyl 10 mg 4 times a day Will discuss further with Dr. Havery Moros we may consider unprepped flexible sigmoidoscopy in am  to rule out any superimposed C. Difficile, or other inflammatory process prior to proceeding with Remicade. Several conversations have been had with the patient over the past few months regarding mentation for Remicade and she is agreeable. We'll plan to initiate Remicade this admission. She is quite anxious and may benefit from an anxiolytic as well when necessary. We will follow closely with you   Daeron Carreno  01/07/2016, 2:23 PM

## 2016-01-07 NOTE — ED Notes (Signed)
Patient transported to X-ray 

## 2016-01-07 NOTE — ED Notes (Signed)
WILL TRANSPORT PT TO 1512-1. AAOX4. PT IN NO APPARENT DISTRESS. FAMILY AT THE BEDSIDE. THE OPPORTUNITY TO ASK QUESTIONS WAS PROVIDED.

## 2016-01-07 NOTE — ED Notes (Signed)
Bed: WA02 Expected date:  Expected time:  Means of arrival:  Comments: 89F/rectal bleeding

## 2016-01-07 NOTE — ED Provider Notes (Signed)
[facility] DEPT Provider Note   CSN: [id number] Arrival date & time: 01/07/16  1118     History   Chief Complaint Chief Complaint  Patient presents with  . Rectal Bleeding    SINCE FRIDAY    HPI Sara Barnes is a 75 y.o. female.  HPI Complains of left lower quadrant abdominal pain and rectal pain for many years, becoming worse over the past 4-5 days. Pain is typical of ulcerative colitis. She also reports several episodes of bloody diarrhea. Admits to nausea, no vomiting. No fever. No other associated symptoms. Nothing makes pain better or worse. Pain is severe. Seen in the emergency department 01/01/2016 for similar complaint, treated and released. patient feels dehydrated. No other associated symptoms. Nothing makes symptoms better or worse. Other is Past Medical History:  Diagnosis Date  . Basal cell carcinoma   . 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 Complication of anesthesia    anxiety post-op after ear surgeries  . Dental crowns present    recent root canal 11/2014  . Family history of adverse reaction to anesthesia    pt's sister has hx. of post-op N/V  . Indigestion   . PONV (postoperative nausea and vomiting)   . Runny nose 12/07/2014   clear drainage, per pt.  . Seasonal allergies   . Ulcerative colitis   . Vitamin B12 deficiency     Patient Active Problem List   Diagnosis Date Noted  . IBS (irritable bowel syndrome)   . Tenesmus   . Ulcerative pancolitis with rectal bleeding ([place])   . Diarrhea 12/24/2015  . Crohn disease ([place]) 12/24/2015  . Abscess of anal or rectal region   . Peri-rectal abscess   . Dehydration   . Ulcerative pancolitis with abscess ([place])   . Sepsis ([place]) 04/19/2015  . Rectal pain   . Leucocytosis   . Rectal bleeding 04/18/2015  . Ulcerative colitis ([place]) 04/18/2015  . 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 ABDOMINAL HYSTERECTOMY     partial  . APPENDECTOMY    . BASAL CELL CARCINOMA EXCISION Left    nose  . BREAST ENHANCEMENT SURGERY    . BREAST LUMPECTOMY Left    age 79's  . BREAST LUMPECTOMY WITH RADIOACTIVE SEED AND SENTINEL LYMPH NODE BIOPSY Left 12/13/2014   Procedure: LEFT BREAST LUMPECTOMY WITH RADIOACTIVE SEED AND SENTINEL LYMPH NODE BIOPSY;  Surgeon: [name], MD;  Location: [facility];  Service: General;  Laterality: Left;  . COLONOSCOPY WITH PROPOFOL  05/28/2011; 06/28/2014  . INCISION AND DRAINAGE PERIRECTAL ABSCESS N/A 09/28/2013   Procedure: IRRIGATION AND DEBRIDEMENT PERIANAL ABSCESS, proctoscopy;  Surgeon: Shann Medal, MD;  Location: [facility];  Service: General;  Laterality: N/A;  . INNER EAR SURGERY Bilateral     OB History    No data available       Home Medications    Prior to Admission medications   Medication Sig Start Date End Date Taking? Authorizing Provider  acetaminophen (TYLENOL) 325 MG tablet Take 325-650 mg by mouth every 6 (six) hours as needed for moderate pain. Reported on 09/25/2015    Historical Provider, MD  belladonna-PHENObarbital (DONNATAL) 16.2 MG/5ML ELIX Take 5 mLs (16.2 mg total) by mouth 3 (three) times daily. 12/29/15   Velvet Bathe, MD  cyanocobalamin (,VITAMIN B-12,) 1000 MCG/ML injection INJECT 1 ML (1,000 MCG TOTAL) INTO THE  MUSCLE EVERY 30 (THIRTY) DAYS. 09/11/15   Jerene Bears, MD  FLUoxetine (PROZAC) 20 MG capsule Take 1 capsule (20 mg total) by mouth daily. 10/11/15   Alferd Apa Lowne Chase, DO  fluticasone (FLONASE) 50 MCG/ACT nasal spray Place 2 sprays into both nostrils daily. Patient taking differently: Place 2 sprays into both nostrils daily as needed for allergies or rhinitis.  06/15/15   Rosalita Chessman Chase, DO  HYDROcodone-acetaminophen (NORCO/VICODIN) 5-325 MG tablet Take 2 tablets by mouth every 4 (four) hours as needed. 01/02/16   Jerene Bears, MD  hydrocortisone (ANUSOL-HC) 25 MG suppository Place 1  suppository (25 mg total) rectally daily. 12/29/15   Velvet Bathe, MD  mesalamine (CANASA) 1000 MG suppository Place 1 suppository (1,000 mg total) rectally at bedtime. 12/29/15   Velvet Bathe, MD  ondansetron (ZOFRAN) 4 MG tablet Take 1 tablet (4 mg total) by mouth every 8 (eight) hours as needed for nausea or vomiting. 01/01/16   Courteney Lyn Mackuen, MD  potassium chloride 20 MEQ TBCR Take 20 mEq by mouth daily. 12/30/15 01/09/16  Velvet Bathe, MD  predniSONE (DELTASONE) 20 MG tablet Take 2 tablets (40 mg total) by mouth daily with breakfast. 12/29/15 01/14/16  Velvet Bathe, MD  zolpidem (AMBIEN) 5 MG tablet Take 1 tablet (5 mg total) by mouth at bedtime as needed. Patient taking differently: Take 5 mg by mouth at bedtime as needed for sleep.  12/11/15   Ann Held, DO    Family History Family History  Problem Relation Age of Onset  . Prostate cancer Brother 69  . Kidney disease Brother   . Diabetes Brother   . Brain cancer Father   . Rheum arthritis Mother   . Anesthesia problems Sister     post-op N/V  . Emphysema Maternal Grandfather   . Asthma Sister   . Rheum arthritis Brother   . Colon cancer Neg Hx   . Esophageal cancer Neg Hx   . Breast cancer Neg Hx     Social History Social History  Substance Use Topics  . Smoking status: Never Smoker  . Smokeless tobacco: Never Used  . Alcohol use 0.0 oz/week     Comment: daily wine with dinner     Allergies   Codeine; Mesalamine; Other; Oxycodone-acetaminophen; and Sulfa antibiotics   Review of Systems Review of Systems  Constitutional: Negative.   HENT: Negative.   Respiratory: Negative.   Cardiovascular: Negative.   Gastrointestinal: Positive for abdominal pain, blood in stool, nausea and rectal pain.  Musculoskeletal: Negative.   Skin: Negative.   Neurological: Negative.   Psychiatric/Behavioral: Negative.   All other systems reviewed and are negative.    Physical Exam Updated Vital Signs BP 146/77 (BP  Location: Right Arm)   Pulse 72   Temp 98 F (36.7 C) (Oral)   Resp 20   Ht 5' 6"  (1.676 m)   Wt 160 lb (72.6 kg)   SpO2 99%   BMI 25.82 kg/m   Physical Exam  Constitutional: She appears well-developed and well-nourished. She appears distressed.  Appears uncomfortable  HENT:  Head: Normocephalic and atraumatic.  Mucous membranes dry  Eyes: Conjunctivae are normal. Pupils are equal, round, and reactive to light.  Neck: Neck supple. No tracheal deviation present. No thyromegaly present.  Cardiovascular: Normal rate and regular rhythm.   No murmur heard. Pulmonary/Chest: Effort normal and breath sounds normal.  Abdominal: Soft. Bowel sounds are normal. She exhibits no distension and no mass. There is no rebound  and no guarding.  Tender at left lower quadrant  Genitourinary:  Genitourinary Comments: Rectal exam refused  Musculoskeletal: Normal range of motion. She exhibits no edema or tenderness.  Neurological: She is alert. Coordination normal.  Skin: Skin is warm and dry. No rash noted.  Psychiatric: She has a normal mood and affect.  Nursing note and vitals reviewed.  X-rays viewed by me Results for orders placed or performed during the hospital encounter of 01/07/16  CBC with Differential/Platelet  Result Value Ref Range   WBC 8.1 4.0 - 10.5 K/uL   RBC 4.69 3.87 - 5.11 MIL/uL   Hemoglobin 14.5 12.0 - 15.0 g/dL   HCT 43.9 36.0 - 46.0 %   MCV 93.6 78.0 - 100.0 fL   MCH 30.9 26.0 - 34.0 pg   MCHC 33.0 30.0 - 36.0 g/dL   RDW 12.4 11.5 - 15.5 %   Platelets 419 (H) 150 - 400 K/uL   Neutrophils Relative % 65 %   Lymphocytes Relative 23 %   Monocytes Relative 11 %   Eosinophils Relative 1 %   Basophils Relative 0 %   Neutro Abs 5.2 1.7 - 7.7 K/uL   Lymphs Abs 1.9 0.7 - 4.0 K/uL   Monocytes Absolute 0.9 0.1 - 1.0 K/uL   Eosinophils Absolute 0.1 0.0 - 0.7 K/uL   Basophils Absolute 0.0 0.0 - 0.1 K/uL   WBC Morphology DOHLE BODIES   Basic metabolic panel  Result Value Ref  Range   Sodium 139 135 - 145 mmol/L   Potassium 3.5 3.5 - 5.1 mmol/L   Chloride 103 101 - 111 mmol/L   CO2 26 22 - 32 mmol/L   Glucose, Bld 104 (H) 65 - 99 mg/dL   BUN 9 6 - 20 mg/dL   Creatinine, Ser 0.74 0.44 - 1.00 mg/dL   Calcium 8.6 (L) 8.9 - 10.3 mg/dL   GFR calc non Af Amer >60 >60 mL/min   GFR calc Af Amer >60 >60 mL/min   Anion gap 10 5 - 15   Ct Abdomen Pelvis W Contrast  Result Date: 01/01/2016 CLINICAL DATA:  Ulcerative colitis, diarrhea and worsening abdominal pain. Recent hospitalization. EXAM: CT ABDOMEN AND PELVIS WITH CONTRAST TECHNIQUE: Multidetector CT imaging of the abdomen and pelvis was performed using the standard protocol following bolus administration of intravenous contrast. CONTRAST:  163m ISOVUE-300 IOPAMIDOL (ISOVUE-300) INJECTION 61%, 163mISOVUE-300 IOPAMIDOL (ISOVUE-300) INJECTION 61% COMPARISON:  12/24/2015, 09/13/2015 and 04/19/2015 FINDINGS: Lower chest: Small right pleural effusion and trace left pleural effusion, new since the prior CT. Hepatobiliary: The liver and gallbladder have a normal appearance. No evidence of biliary ductal dilatation. Pancreas: No mass, inflammatory changes, or other significant abnormality. Spleen: Within normal limits in size and appearance. Adrenals/Urinary Tract: No masses identified. No evidence of hydronephrosis. No urinary tract calculi identified. The bladder is unremarkable. Stomach/Bowel: Inflammation and circumferential wall thickening involving the hepatic flexure and transverse colon has resolved. There remains segmental thickening of the colon beginning just below the splenic flexure and extending into the proximal sigmoid. No evidence of bowel obstruction, perforation or abscess. Small bowel appears normal. No free fluid. Vascular/Lymphatic: No pathologically enlarged lymph nodes. No evidence of abdominal aortic aneurysm. Reproductive: Status post hysterectomy. No pelvic masses identified. Small retained ovaries again  visualized. Other: No hernias seen. Musculoskeletal:  Bony structures show stable spondylosis at L5-S1. IMPRESSION: 1. Interval improvement by CT in appearance of colitis. Since the study on 08/28, thickening of the hepatic flexure and transverse colon appear essentially resolved.  There remains wall thickening involving the descending colon extending into the proximal sigmoid. No evidence of obstruction, bowel perforation or abscess. 2. New small right and trace left pleural effusions. Electronically Signed   By: Aletta Edouard M.D.   On: 01/01/2016 14:29   Ct Abdomen Pelvis W Contrast  Result Date: 12/25/2015 CLINICAL DATA:  Ulcerative colitis versus Crohn's disease. Abdominal pain. Nausea without vomiting. EXAM: CT ABDOMEN AND PELVIS WITH CONTRAST TECHNIQUE: Multidetector CT imaging of the abdomen and pelvis was performed using the standard protocol following bolus administration of intravenous contrast. CONTRAST:  149m ISOVUE-300 IOPAMIDOL (ISOVUE-300) INJECTION 61% COMPARISON:  CT abdomen/ pelvis 09/13/2015 FINDINGS: Lower chest: No pulmonary nodules. Trace pericardial fluid. Bilateral breast implants are noted. Hepatobiliary: Normal hepatic size and contours without focal liver lesion. No perihepatic ascites. No intra- or extrahepatic biliary dilatation. Normal gallbladder. Pancreas: Normal pancreatic contours and enhancement. No peripancreatic fluid collection or pancreatic ductal dilatation. Spleen: Normal. Adrenals/Urinary Tract: Normal adrenal glands. No hydronephrosis or solid renal mass. Stomach/Bowel: There is wall thickening and mild inflammatory stranding surrounding the colon from the rectum to the hepatic flexure. The appearance of the rectosigmoid colon is unchanged compared to the study of 09/13/2015, but the involvement of the descending colon and transverse colon is new. There is no evidence of small-bowel obstruction. There is no abdominal fluid collection. Vascular/Lymphatic: Normal  course and caliber of the major abdominal vessels. No abdominal or pelvic adenopathy. Reproductive: The uterus is surgically absent. Ovaries are not clearly visualized. No free fluid in the pelvis. Musculoskeletal: Multilevel thoracic osteophytosis. No lytic or blastic lesions. The visualized extraperitoneal and extrathoracic soft tissues are normal. Other: No contributory non-categorized findings. IMPRESSION: 1. Wall thickening and inflammatory stranding of the colon from the hepatic flexure to the rectum, consistent with colitis. The distribution is compatible with the diagnosis of ulcerative colitis. The involvement of the rectosigmoid colon is unchanged compared to the prior study, but the involvement of the transverse and descending colon is new/worsened. 2. No fluid collection or abscess. Electronically Signed   By: KUlyses JarredM.D.   On: 12/25/2015 01:43   Dg Abd Acute W/chest  Result Date: 01/07/2016 CLINICAL DATA:  Bloody diarrhea and left lower quadrant abdominal pain as well as rectal pain without fever or nausea and vomiting. Patient recently hospitalized for colitis. EXAM: DG ABDOMEN ACUTE W/ 1V CHEST COMPARISON:  CT scan of the chest of January 01, 2016 FINDINGS: Chest x-ray: The lungs are adequately inflated and clear. The patient has undergone previous breast surgery for malignancy. There is no alveolar infiltrate or pleural effusion. The heart and pulmonary vascularity are normal. The bony structures exhibit no acute abnormality. Within the abdomen there are small amounts of gas within multiple normal caliber and an minimally distended small bowel loops. There is gas in the ascending colon. No rectal gas is observed. No free extraluminal gas collections are demonstrated. The bony structures exhibit no acute abnormalities. There are no abnormal soft tissue calcifications. IMPRESSION: The bowel gas pattern suggests an ileus or gastroenteritis type process. There is no evidence of obstruction or  perforation. No acute cardiopulmonary abnormality. Electronically Signed   By: David  JMartiniqueM.D.   On: 01/07/2016 13:49    ED Treatments / Results  Labs (all labs ordered are listed, but only abnormal results are displayed) Labs Reviewed  CBC WITH DIFFERENTIAL/PLATELET  BASIC METABOLIC PANEL    EKG  EKG Interpretation None       Radiology No results found.  Procedures Procedures (including  critical care time)  Medications Ordered in ED Medications  sodium chloride 0.9 % bolus 2,000 mL (not administered)  HYDROmorphone (DILAUDID) injection 1 mg (not administered)  metoCLOPramide (REGLAN) injection 10 mg (not administered)    1: 15 p.m. pain and nausea much improved after treatment with intravenous Reglan and intravenous hydromorphone.[facility] gastroenterology service consulted and evaluated patient in ED. Request overnight stay with hospitalist service.Dr Renne Crigler consulted and will arrange for 23 hour observation to MedSurg floor Initial Impression / Assessment and Plan / ED Course  I have reviewed the triage vital signs and the nursing notes.  Pertinent labs & imaging results that were available during my care of the patient were reviewed by me and considered in my medical decision making (see chart for details).  Clinical Course    Plan clear liquid diet intravenous Solu-Medrol, Bentyl, symptomatic treatment  Final Clinical Impressions(s) / ED Diagnoses  Diagnosis Final diagnoses:  None  #1 left lower quadrant abdominal pain #2 rectal pain #3 rectal bleeding  New Prescriptions New Prescriptions   No medications on file     Orlie Dakin, MD 01/07/16 1605

## 2016-01-08 ENCOUNTER — Observation Stay (HOSPITAL_COMMUNITY): Payer: Commercial Managed Care - HMO | Admitting: Anesthesiology

## 2016-01-08 ENCOUNTER — Encounter (HOSPITAL_COMMUNITY)
Admission: EM | Disposition: A | Discharge status: Skilled Nursing Facility | Payer: Self-pay | Source: Home / Self Care | Attending: Nephrology

## 2016-01-08 ENCOUNTER — Encounter (HOSPITAL_COMMUNITY): Payer: Self-pay

## 2016-01-08 DIAGNOSIS — K529 Noninfective gastroenteritis and colitis, unspecified: Secondary | ICD-10-CM | POA: Diagnosis not present

## 2016-01-08 DIAGNOSIS — K611 Rectal abscess: Secondary | ICD-10-CM | POA: Diagnosis not present

## 2016-01-08 DIAGNOSIS — K50114 Crohn's disease of large intestine with abscess: Secondary | ICD-10-CM | POA: Diagnosis not present

## 2016-01-08 DIAGNOSIS — K51011 Ulcerative (chronic) pancolitis with rectal bleeding: Principal | ICD-10-CM

## 2016-01-08 DIAGNOSIS — K6289 Other specified diseases of anus and rectum: Secondary | ICD-10-CM | POA: Diagnosis not present

## 2016-01-08 DIAGNOSIS — E46 Unspecified protein-calorie malnutrition: Secondary | ICD-10-CM | POA: Diagnosis not present

## 2016-01-08 DIAGNOSIS — K56609 Unspecified intestinal obstruction, unspecified as to partial versus complete obstruction: Secondary | ICD-10-CM | POA: Diagnosis not present

## 2016-01-08 DIAGNOSIS — F411 Generalized anxiety disorder: Secondary | ICD-10-CM | POA: Diagnosis not present

## 2016-01-08 DIAGNOSIS — F329 Major depressive disorder, single episode, unspecified: Secondary | ICD-10-CM | POA: Diagnosis present

## 2016-01-08 DIAGNOSIS — K567 Ileus, unspecified: Secondary | ICD-10-CM | POA: Diagnosis not present

## 2016-01-08 DIAGNOSIS — Z85828 Personal history of other malignant neoplasm of skin: Secondary | ICD-10-CM | POA: Diagnosis not present

## 2016-01-08 DIAGNOSIS — K633 Ulcer of intestine: Secondary | ICD-10-CM | POA: Diagnosis not present

## 2016-01-08 DIAGNOSIS — R197 Diarrhea, unspecified: Secondary | ICD-10-CM | POA: Diagnosis not present

## 2016-01-08 DIAGNOSIS — K51014 Ulcerative (chronic) pancolitis with abscess: Secondary | ICD-10-CM | POA: Diagnosis not present

## 2016-01-08 DIAGNOSIS — R159 Full incontinence of feces: Secondary | ICD-10-CM | POA: Diagnosis present

## 2016-01-08 DIAGNOSIS — Z932 Ileostomy status: Secondary | ICD-10-CM | POA: Diagnosis not present

## 2016-01-08 DIAGNOSIS — R262 Difficulty in walking, not elsewhere classified: Secondary | ICD-10-CM | POA: Diagnosis present

## 2016-01-08 DIAGNOSIS — R1084 Generalized abdominal pain: Secondary | ICD-10-CM | POA: Diagnosis not present

## 2016-01-08 DIAGNOSIS — D649 Anemia, unspecified: Secondary | ICD-10-CM | POA: Diagnosis not present

## 2016-01-08 DIAGNOSIS — R5081 Fever presenting with conditions classified elsewhere: Secondary | ICD-10-CM | POA: Diagnosis not present

## 2016-01-08 DIAGNOSIS — I959 Hypotension, unspecified: Secondary | ICD-10-CM | POA: Diagnosis not present

## 2016-01-08 DIAGNOSIS — K30 Functional dyspepsia: Secondary | ICD-10-CM | POA: Diagnosis present

## 2016-01-08 DIAGNOSIS — R000 Tachycardia, unspecified: Secondary | ICD-10-CM | POA: Diagnosis not present

## 2016-01-08 DIAGNOSIS — E538 Deficiency of other specified B group vitamins: Secondary | ICD-10-CM | POA: Diagnosis not present

## 2016-01-08 DIAGNOSIS — K922 Gastrointestinal hemorrhage, unspecified: Secondary | ICD-10-CM | POA: Diagnosis not present

## 2016-01-08 DIAGNOSIS — Z853 Personal history of malignant neoplasm of breast: Secondary | ICD-10-CM | POA: Diagnosis not present

## 2016-01-08 DIAGNOSIS — M069 Rheumatoid arthritis, unspecified: Secondary | ICD-10-CM | POA: Diagnosis present

## 2016-01-08 DIAGNOSIS — D500 Iron deficiency anemia secondary to blood loss (chronic): Secondary | ICD-10-CM | POA: Diagnosis not present

## 2016-01-08 DIAGNOSIS — R1030 Lower abdominal pain, unspecified: Secondary | ICD-10-CM | POA: Diagnosis not present

## 2016-01-08 DIAGNOSIS — F418 Other specified anxiety disorders: Secondary | ICD-10-CM

## 2016-01-08 DIAGNOSIS — Z808 Family history of malignant neoplasm of other organs or systems: Secondary | ICD-10-CM | POA: Diagnosis not present

## 2016-01-08 DIAGNOSIS — R1012 Left upper quadrant pain: Secondary | ICD-10-CM | POA: Diagnosis present

## 2016-01-08 DIAGNOSIS — K612 Anorectal abscess: Secondary | ICD-10-CM | POA: Diagnosis not present

## 2016-01-08 DIAGNOSIS — K594 Anal spasm: Secondary | ICD-10-CM | POA: Diagnosis present

## 2016-01-08 DIAGNOSIS — E876 Hypokalemia: Secondary | ICD-10-CM | POA: Diagnosis not present

## 2016-01-08 DIAGNOSIS — Z79899 Other long term (current) drug therapy: Secondary | ICD-10-CM | POA: Diagnosis not present

## 2016-01-08 DIAGNOSIS — D62 Acute posthemorrhagic anemia: Secondary | ICD-10-CM | POA: Diagnosis not present

## 2016-01-08 DIAGNOSIS — K523 Indeterminate colitis: Secondary | ICD-10-CM | POA: Diagnosis not present

## 2016-01-08 DIAGNOSIS — K604 Rectal fistula: Secondary | ICD-10-CM | POA: Diagnosis not present

## 2016-01-08 DIAGNOSIS — K626 Ulcer of anus and rectum: Secondary | ICD-10-CM | POA: Diagnosis present

## 2016-01-08 DIAGNOSIS — K50111 Crohn's disease of large intestine with rectal bleeding: Secondary | ICD-10-CM | POA: Diagnosis not present

## 2016-01-08 DIAGNOSIS — D696 Thrombocytopenia, unspecified: Secondary | ICD-10-CM | POA: Diagnosis not present

## 2016-01-08 DIAGNOSIS — Z7952 Long term (current) use of systemic steroids: Secondary | ICD-10-CM | POA: Diagnosis not present

## 2016-01-08 DIAGNOSIS — H9190 Unspecified hearing loss, unspecified ear: Secondary | ICD-10-CM | POA: Diagnosis present

## 2016-01-08 DIAGNOSIS — Z825 Family history of asthma and other chronic lower respiratory diseases: Secondary | ICD-10-CM | POA: Diagnosis not present

## 2016-01-08 DIAGNOSIS — K219 Gastro-esophageal reflux disease without esophagitis: Secondary | ICD-10-CM | POA: Diagnosis present

## 2016-01-08 DIAGNOSIS — R5381 Other malaise: Secondary | ICD-10-CM | POA: Diagnosis present

## 2016-01-08 HISTORY — PX: FLEXIBLE SIGMOIDOSCOPY: SHX5431

## 2016-01-08 LAB — COMPREHENSIVE METABOLIC PANEL
ALBUMIN: 2.4 g/dL — AB (ref 3.5–5.0)
ALK PHOS: 73 U/L (ref 38–126)
ALT: 15 U/L (ref 14–54)
ANION GAP: 8 (ref 5–15)
AST: 11 U/L — AB (ref 15–41)
BILIRUBIN TOTAL: 0.4 mg/dL (ref 0.3–1.2)
BUN: 6 mg/dL (ref 6–20)
CALCIUM: 8.2 mg/dL — AB (ref 8.9–10.3)
CO2: 26 mmol/L (ref 22–32)
Chloride: 103 mmol/L (ref 101–111)
Creatinine, Ser: 0.62 mg/dL (ref 0.44–1.00)
GFR calc Af Amer: >60 mL/min (ref >60)
GLUCOSE: 107 mg/dL — AB (ref 65–99)
Potassium: 3.8 mmol/L (ref 3.5–5.1)
Sodium: 137 mmol/L (ref 135–145)
TOTAL PROTEIN: 5.7 g/dL — AB (ref 6.5–8.1)

## 2016-01-08 LAB — CBC
HCT: 39.8 % (ref 36.0–46.0)
Hemoglobin: 12.8 g/dL (ref 12.0–15.0)
MCH: 30.5 pg (ref 26.0–34.0)
MCHC: 32.2 g/dL (ref 30.0–36.0)
MCV: 94.8 fL (ref 78.0–100.0)
Platelets: 349 10*3/uL (ref 150–400)
RBC: 4.2 MIL/uL (ref 3.87–5.11)
RDW: 12.5 % (ref 11.5–15.5)
WBC: 5.5 10*3/uL (ref 4.0–10.5)

## 2016-01-08 LAB — C-REACTIVE PROTEIN: CRP: 19.6 mg/dL — AB (ref <1.0)

## 2016-01-08 SURGERY — SIGMOIDOSCOPY, FLEXIBLE
Anesthesia: Monitor Anesthesia Care

## 2016-01-08 MED ORDER — LACTATED RINGERS IV SOLN: INTRAVENOUS | Status: DC | PRN

## 2016-01-08 MED ORDER — PROPOFOL 500 MG/50ML IV EMUL
INTRAVENOUS | Status: DC | PRN
  Administered 2016-01-08: 125 ug/kg/min via INTRAVENOUS

## 2016-01-08 MED ORDER — SODIUM CHLORIDE 0.9 % IV SOLN
INTRAVENOUS | Status: DC | PRN
  Administered 2016-01-08: 15:00:00 via INTRAVENOUS

## 2016-01-08 MED ORDER — PROPOFOL 10 MG/ML IV BOLUS
INTRAVENOUS | Status: DC | PRN
  Administered 2016-01-08 (×3): 20 mg via INTRAVENOUS

## 2016-01-08 MED ORDER — FLUOXETINE HCL 20 MG PO CAPS
20.0000 mg | ORAL_CAPSULE | Freq: Every day | ORAL | Status: DC
  Administered 2016-01-08 – 2016-01-26 (×18): 20 mg via ORAL
  Filled 2016-01-08 (×19): qty 1

## 2016-01-08 MED ORDER — LIDOCAINE 2% (20 MG/ML) 5 ML SYRINGE
INTRAMUSCULAR | Status: DC | PRN
  Administered 2016-01-08: 60 mg via INTRAVENOUS

## 2016-01-08 MED ORDER — FLUTICASONE PROPIONATE 50 MCG/ACT NA SUSP: 2.0000 | Freq: Every day | NASAL | Status: DC | PRN

## 2016-01-08 NOTE — Anesthesia Postprocedure Evaluation (Signed)
Anesthesia Post Note  Patient: Sara Barnes  Procedure(s) Performed: Procedure(s) (LRB): FLEXIBLE SIGMOIDOSCOPY (N/A)  Patient location during evaluation: PACU Anesthesia Type: MAC Level of consciousness: awake and alert Pain management: pain level controlled Vital Signs Assessment: post-procedure vital signs reviewed and stable Respiratory status: spontaneous breathing, nonlabored ventilation, respiratory function stable and patient connected to nasal cannula oxygen Cardiovascular status: stable and blood pressure returned to baseline Anesthetic complications: no    Last Vitals:  Vitals:   01/08/16 1540 01/08/16 1554  BP: (!) 145/59 (!) 144/64  Pulse: 69 67  Resp: 17 18  Temp:  36.9 C    Last Pain:  Vitals:   01/08/16 1554  TempSrc: Oral  PainSc:                  Nilda Simmer

## 2016-01-08 NOTE — Anesthesia Preprocedure Evaluation (Addendum)
Anesthesia Evaluation  Patient identified by MRN, date of birth, ID band Patient awake    Reviewed: Allergy & Precautions, NPO status , Patient's Chart, lab work & pertinent test results  History of Anesthesia Complications (+) PONV  Airway Mallampati: I  TM Distance: >3 FB Neck ROM: Full    Dental  (+) Teeth Intact   Pulmonary neg pulmonary ROS,    breath sounds clear to auscultation       Cardiovascular Exercise Tolerance: Good  Rhythm:Regular Rate:Normal     Neuro/Psych PSYCHIATRIC DISORDERS Depression negative neurological ROS     GI/Hepatic negative GI ROS, Neg liver ROS, PUD, GERD  Medicated,  Endo/Other  negative endocrine ROS  Renal/GU negative Renal ROS  negative genitourinary   Musculoskeletal negative musculoskeletal ROS (+)   Abdominal   Peds negative pediatric ROS (+)  Hematology negative hematology ROS (+)   Anesthesia Other Findings   Reproductive/Obstetrics negative OB ROS                             Anesthesia Physical  Anesthesia Plan  ASA: II  Anesthesia Plan: MAC   Post-op Pain Management:    Induction: Intravenous  Airway Management Planned: Natural Airway, Nasal Cannula and Simple Face Mask  Additional Equipment:   Intra-op Plan:   Post-operative Plan:   Informed Consent: I have reviewed the patients History and Physical, chart, labs and discussed the procedure including the risks, benefits and alternatives for the proposed anesthesia with the patient or authorized representative who has indicated his/her understanding and acceptance.     Plan Discussed with: CRNA  Anesthesia Plan Comments:         Anesthesia Quick Evaluation

## 2016-01-08 NOTE — Progress Notes (Signed)
Patient ID: Sara Barnes, female   DOB: Jan 16, 1941, 75 y.o.   MRN: [id number]    Progress Note   Subjective  Best night she has had in a month even though she didn't sleep much her pain was decreased and she is very appreciative . Stooling has also slowed way down  Cdiff negative  HGB 12.8   Objective   Vital signs in last 24 hours: Temp:  [98 F (36.7 C)-99.5 F (37.5 C)] 98.1 F (36.7 C) (09/12 0537) Pulse Rate:  [69-85] 74 (09/12 0537) Resp:  [16-20] 18 (09/12 0537) BP: (120-146)/(75-80) 120/80 (09/12 0537) SpO2:  [96 %-99 %] 98 % (09/12 0537) Weight:  [160 lb (72.6 kg)] 160 lb (72.6 kg) (09/11 1136) Last BM Date: 01/08/16 General: older    white female in NAD Heart:  Regular rate and rhythm; no murmurs Lungs: Respirations even and unlabored, lungs CTA bilaterally Abdomen:  Soft, tender LLQ  and nondistended. Normal bowel sounds. Extremities:  Without edema. Neurologic:  Alert and oriented,  grossly normal neurologically. Psych:  Cooperative. Normal mood and affect.  Intake/Output from previous day: No intake/output data recorded. Intake/Output this shift: No intake/output data recorded.  Lab Results:  Recent Labs  01/07/16 1358 01/08/16 0603  WBC 8.1 5.5  HGB 14.5 12.8  HCT 43.9 39.8  PLT 419* 349   BMET  Recent Labs  01/07/16 1358 01/08/16 0603  NA 139 137  K 3.5 3.8  CL 103 103  CO2 26 26  GLUCOSE 104* 107*  BUN 9 6  CREATININE 0.74 0.62  CALCIUM 8.6* 8.2*   LFT  Recent Labs  01/08/16 0603  PROT 5.7*  ALBUMIN 2.4*  AST 11*  ALT 15  ALKPHOS 73  BILITOT 0.4   PT/INR No results for input(s): LABPROT, INR in the last 72 hours.  Studies/Results: Dg Abd Acute W/chest  Result Date: 01/07/2016 CLINICAL DATA:  Bloody diarrhea and left lower quadrant abdominal pain as well as rectal pain without fever or nausea and vomiting. Patient recently hospitalized for colitis. EXAM: DG ABDOMEN ACUTE W/ 1V CHEST COMPARISON:  CT scan of the chest of  January 01, 2016 FINDINGS: Chest x-ray: The lungs are adequately inflated and clear. The patient has undergone previous breast surgery for malignancy. There is no alveolar infiltrate or pleural effusion. The heart and pulmonary vascularity are normal. The bony structures exhibit no acute abnormality. Within the abdomen there are small amounts of gas within multiple normal caliber and an minimally distended small bowel loops. There is gas in the ascending colon. No rectal gas is observed. No free extraluminal gas collections are demonstrated. The bony structures exhibit no acute abnormalities. There are no abnormal soft tissue calcifications. IMPRESSION: The bowel gas pattern suggests an ileus or gastroenteritis type process. There is no evidence of obstruction or perforation. No acute cardiopulmonary abnormality. Electronically Signed   By: David  Martinique M.D.   On: 01/07/2016 13:49       Assessment / Plan:    #1 75 yo WF with refractory ulcerative colitis , requiring ongoing steroids  Admitted with progressive abdominal  /rectal pain, rectal bleeding and diarrhea She has settled down quite a bit just with analgesics- suspect she has had severe tenesmus from rectal involvement .She is not having large volume diarrhea  #2 hx breast CA 2016  Plan; Continue IV Solumedrol 60 daily IV analgesics, and around the clock antispasmotics For Flex with bx today to r/o CMV- if negative bx will proceed with Remicade induction this  admit   Active Problems:   Ulcerative pancolitis with rectal bleeding ([facility])   Abdominal pain   GI bleed     LOS: 0 days   Enyla Lisbon  01/08/2016, 9:27 AM

## 2016-01-08 NOTE — Anesthesia Procedure Notes (Signed)
Procedure Name: MAC Date/Time: 01/08/2016 3:06 PM Performed by: Dione Booze Pre-anesthesia Checklist: Patient identified, Emergency Drugs available, Suction available and Patient being monitored Patient Re-evaluated:Patient Re-evaluated prior to inductionOxygen Delivery Method: Simple face mask Placement Confirmation: positive ETCO2

## 2016-01-08 NOTE — Transfer of Care (Signed)
Immediate Anesthesia Transfer of Care Note  Patient: Sara Barnes  Procedure(s) Performed: Procedure(s) with comments: FLEXIBLE SIGMOIDOSCOPY (N/A) - needs MAC due to pain and anxiety  Patient Location: PACU and Endoscopy Unit  Anesthesia Type:MAC  Level of Consciousness: awake and patient cooperative  Airway & Oxygen Therapy: Patient Spontanous Breathing and Patient connected to face mask oxygen  Post-op Assessment: Report given to RN and Post -op Vital signs reviewed and stable  Post vital signs: Reviewed and stable  Last Vitals:  Vitals:   01/08/16 0537 01/08/16 1353  BP: 120/80 (!) 161/76  Pulse: 74 68  Resp: 18 14  Temp: 36.7 C 37.4 C    Last Pain:  Vitals:   01/08/16 1353  TempSrc: Oral  PainSc: 7       Patients Stated Pain Goal: 2 (23/36/12 2449)  Complications: No apparent anesthesia complications

## 2016-01-08 NOTE — Progress Notes (Signed)
PROGRESS NOTE    Sara Barnes  RSW:[id number] DOB: 1940/08/20 DOA: 01/07/2016 PCP: Ann Held, DO   Chief Complaint  Patient presents with  . Rectal Bleeding    SINCE FRIDAY    Brief Narrative:  HPI on 01/07/2016 by Dr. Marzetta Board GELISA TIEKEN is a 75 y.o. femalewith medical history significant of ulcerative colitis versus Crohn's disease, believed to be UC eventually, otherwise healthy, presents to the emergency room with chief complaint of severe abdominal pain for the past several days. She was recently hospitalized and discharged home about 9 days ago. She was here in the hospital with similar complaints, placed on steroids, and tells me today that she improved some and really wanted to go home. At home, she has had ongoing symptoms, never really well achieved full remission, and over the last few days she  has felt extremely unwell. She has crampy abdominal pain, nausea, poor appetite, barely able to keep down some Gatorade, and has been having significant amounts of bloody bowel movements as well. She has no fever or chills. She has no chest pain or shortness of breath. She denies any palpitations. She has no lightheadedness or dizziness. She feels generalized weakness. In the emergency room her vital signs are stable, her blood work is essentially unremarkable. Gastroenterology was consulted by EDP and have evaluated patient, and TRH was asked for admission  Assessment & Plan   Abdominal pain secondary to Ulcerative colitis flare with rectal bleeding -Gastroenterology consulted and appreciated -Hemoglobin currently stable -Plan for flexible sigmoidoscopy today.  If CMV is negative, GI plans to start remicaide  -Continue pain control, IVF, solumedrol  Diarrhea secondary to UC flare -Cdiff negative -Continue supportive care  Depression/Anxiety -Continue prozac  DVT Prophylaxis  Lovenox  Code Status: Full  Family Communication: none at bedside  Disposition  Plan: In observation. Pending flex sig.   Consultants Gastroenterology   Procedures  None  Antibiotics   Anti-infectives    None      Subjective:   Ikran Patman seen and examined today.  Continues to complain of abdominal pain. She states her diarrhea appears to be slowing down.  Denies chest pain, shortness of breath, headache, dizziness. Does admit to feeling anxious.  Objective:   Vitals:   01/07/16 1657 01/07/16 1721 01/07/16 2154 01/08/16 0537  BP: 137/75 (!) 141/78 135/75 120/80  Pulse: 84 75 85 74  Resp: 17 18 18 18   Temp: 98.3 F (36.8 C) 98.5 F (36.9 C) 99.5 F (37.5 C) 98.1 F (36.7 C)  TempSrc: Oral Oral Oral Oral  SpO2: 98% 99% 97% 98%  Weight:      Height:        Intake/Output Summary (Last 24 hours) at 01/08/16 1331 Last data filed at 01/08/16 0813  Gross per 24 hour  Intake                0 ml  Output                0 ml  Net                0 ml   Filed Weights   01/07/16 1136  Weight: 72.6 kg (160 lb)    Exam  General: Well developed, well nourished, NAD, appears stated age  HEENT: NCAT, mucous membranes moist.   Cardiovascular: S1 S2 auscultated, no rubs, murmurs or gallops. Regular rate and rhythm.  Respiratory: Clear to auscultation bilaterally with equal chest rise  Abdomen:  Soft, LLQ TTP, nondistended, + bowel sounds  Extremities: warm dry without cyanosis clubbing or edema  Neuro: AAOx3, nonfocal  Psych: Appropriate mood and affect, mildly anxious   Data Reviewed: I have personally reviewed following labs and imaging studies  CBC:  Recent Labs Lab 01/07/16 1358 01/08/16 0603  WBC 8.1 5.5  NEUTROABS 5.2  --   HGB 14.5 12.8  HCT 43.9 39.8  MCV 93.6 94.8  PLT 419* 671   Basic Metabolic Panel:  Recent Labs Lab 01/03/16 1257 01/07/16 1358 01/08/16 0603  NA 134* 139 137  K 3.6 3.5 3.8  CL 99 103 103  CO2 29 26 26   GLUCOSE 136* 104* 107*  BUN 7 9 6   CREATININE 0.81 0.74 0.62  CALCIUM 8.6 8.6* 8.2*    GFR: Estimated Creatinine Clearance: 62.9 mL/min (by C-G formula based on SCr of 0.8 mg/dL). Liver Function Tests:  Recent Labs Lab 01/08/16 0603  AST 11*  ALT 15  ALKPHOS 73  BILITOT 0.4  PROT 5.7*  ALBUMIN 2.4*   No results for input(s): LIPASE, AMYLASE in the last 168 hours. No results for input(s): AMMONIA in the last 168 hours. Coagulation Profile: No results for input(s): INR, PROTIME in the last 168 hours. Cardiac Enzymes: No results for input(s): CKTOTAL, CKMB, CKMBINDEX, TROPONINI in the last 168 hours. BNP (last 3 results) No results for input(s): PROBNP in the last 8760 hours. HbA1C: No results for input(s): HGBA1C in the last 72 hours. CBG: No results for input(s): GLUCAP in the last 168 hours. Lipid Profile: No results for input(s): CHOL, HDL, LDLCALC, TRIG, CHOLHDL, LDLDIRECT in the last 72 hours. Thyroid Function Tests: No results for input(s): TSH, T4TOTAL, FREET4, T3FREE, THYROIDAB in the last 72 hours. Anemia Panel: No results for input(s): VITAMINB12, FOLATE, FERRITIN, TIBC, IRON, RETICCTPCT in the last 72 hours. Urine analysis:    Component Value Date/Time   COLORURINE YELLOW 04/19/2015 0838   APPEARANCEUR CLOUDY (A) 04/19/2015 0838   LABSPEC 1.013 04/19/2015 0838   PHURINE 6.0 04/19/2015 0838   GLUCOSEU NEGATIVE 04/19/2015 0838   HGBUR MODERATE (A) 04/19/2015 0838   HGBUR small 12/13/2009 0904   BILIRUBINUR NEGATIVE 04/19/2015 0838   BILIRUBINUR ne 01/30/2015 1543   KETONESUR 40 (A) 04/19/2015 0838   PROTEINUR NEGATIVE 04/19/2015 0838   UROBILINOGEN 0.2 01/30/2015 1543   UROBILINOGEN 0.2 09/25/2013 1350   NITRITE NEGATIVE 04/19/2015 0838   LEUKOCYTESUR TRACE (A) 04/19/2015 0838   Sepsis Labs: @LABRCNTIP (procalcitonin:4,lacticidven:4)  ) Recent Results (from the past 240 hour(s))  C difficile quick scan w PCR reflex     Status: None   Collection Time: 01/07/16  3:05 PM  Result Value Ref Range Status   C Diff antigen NEGATIVE NEGATIVE  Final   C Diff toxin NEGATIVE NEGATIVE Final   C Diff interpretation No C. difficile detected.  Final      Radiology Studies: Dg Abd Acute W/chest  Result Date: 01/07/2016 CLINICAL DATA:  Bloody diarrhea and left lower quadrant abdominal pain as well as rectal pain without fever or nausea and vomiting. Patient recently hospitalized for colitis. EXAM: DG ABDOMEN ACUTE W/ 1V CHEST COMPARISON:  CT scan of the chest of January 01, 2016 FINDINGS: Chest x-ray: The lungs are adequately inflated and clear. The patient has undergone previous breast surgery for malignancy. There is no alveolar infiltrate or pleural effusion. The heart and pulmonary vascularity are normal. The bony structures exhibit no acute abnormality. Within the abdomen there are small amounts of gas within multiple normal  caliber and an minimally distended small bowel loops. There is gas in the ascending colon. No rectal gas is observed. No free extraluminal gas collections are demonstrated. The bony structures exhibit no acute abnormalities. There are no abnormal soft tissue calcifications. IMPRESSION: The bowel gas pattern suggests an ileus or gastroenteritis type process. There is no evidence of obstruction or perforation. No acute cardiopulmonary abnormality. Electronically Signed   By: David  Martinique M.D.   On: 01/07/2016 13:49     Scheduled Meds: . chlorhexidine  15 mL Mouth/Throat BID  . dicyclomine  10 mg Oral TID AC & HS  . enoxaparin (LOVENOX) injection  40 mg Subcutaneous Q24H  . FLUoxetine  20 mg Oral Daily  . methylPREDNISolone (SOLU-MEDROL) injection  30 mg Intravenous Q12H   Continuous Infusions:    LOS: 0 days   Time Spent in minutes   30 minutes  Delmer Kowalski D.O. on 01/08/2016 at 1:31 PM  Between 7am to 7pm - Pager - [phone]  After 7pm go to [email or web address] - password [email or web address]  And look for the night coverage person covering for me after hours  [facility] Office  [phone]

## 2016-01-08 NOTE — Op Note (Signed)
[facility] Patient Name: Sara Barnes Procedure Date: 01/08/2016 MRN: [id number] Attending MD: Carlota Raspberry. Havery Moros , MD Date of Birth: 1940-05-21 CSN: [id number] Age: 75 Admit Type: Inpatient Procedure:                Flexible Sigmoidoscopy Indications:              Follow-up of ulcerative colitis, severe disease                            with failure of oral steroids, multiple courses, C                            Diff negative, rule out CMV prior to starting                            Remicade Providers:                Carlota Raspberry. Havery Moros, MD, Cleda Daub, RN, Ralene Bathe, Technician, Dione Booze, CRNA Referring MD:              Medicines:                Monitored Anesthesia Care Complications:            No immediate complications. Estimated blood loss:                            Minimal. Estimated Blood Loss:     Estimated blood loss was minimal. Procedure:                Pre-Anesthesia Assessment:                           - Prior to the procedure, a History and Physical                            was performed, and patient medications and                            allergies were reviewed. The patient's tolerance of                            previous anesthesia was also reviewed. The risks                            and benefits of the procedure and the sedation                            options and risks were discussed with the patient.                            All questions were answered, and informed consent  was obtained. Prior Anticoagulants: The patient has                            taken Lovenox (enoxaparin), last dose was 1 day                            prior to procedure. ASA Grade Assessment: III - A                            patient with severe systemic disease. After                            reviewing the risks and benefits, the patient was                            deemed in  satisfactory condition to undergo the                            procedure.                           After obtaining informed consent, the scope was                            passed under direct vision. The was introduced                            through the anus and advanced to the the sigmoid                            colon. The flexible sigmoidoscopy was accomplished                            without difficulty. The patient tolerated the                            procedure well. The quality of the bowel                            preparation was fair. Scope In: 3:16:06 PM Scope Out: 3:20:04 PM Total Procedure Duration: 0 hours 3 minutes 58 seconds  Findings:      The perianal and digital rectal examinations were normal. No evidence of       perirectal / perianal abscess or fistula. No anal fissure.      evere inflammation characterized by congestion, friability, loss of       vascularity and deep ulcerations was found from the rectum to the       sigmoid colon. Many large deep ulcers were noted in the rectum, suspect       this is causing the patient's discomfort. Biopsies were taken with a       cold forceps for histology in multiple areas to rule out CMV.       Retroflexion was not performed given the severity of the inflammation. Impression:               -  Preparation of the colon was fair.                           - Severe inflammation was found from the rectum to                            the sigmoid colon as outlined above. Biopsied to                            rule out CMV. Moderate Sedation:      No moderate sedation, case performed with MAC Recommendation:           - Return patient to hospital ward for ongoing care.                           - Resume previous diet.                           - Continue present medications.                           - Await pathology results. Procedure Code(s):        --- Professional ---                           [phone],  Sigmoidoscopy, flexible; with biopsy, single                            or multiple Diagnosis Code(s):        --- Professional ---                           K52.9, Noninfective gastroenteritis and colitis,                            unspecified                           K51.90, Ulcerative colitis, unspecified, without                            complications CPT copyright 2016 [employer]. All rights reserved. The codes documented in this report are preliminary and upon coder review may  be revised to meet current compliance requirements. [name], MD 01/08/2016 3:31:20 PM This report has been signed electronically. Number of Addenda: 0

## 2016-01-08 NOTE — Care Management Obs Status (Signed)
[place] NOTIFICATION   Patient Details  Name: ESTELL PUCCINI MRN: [id number] Date of Birth: 12/13/1940   Medicare Observation Status Notification Given:  Yes    Dellie Catholic, RN 01/08/2016, 11:47 AM

## 2016-01-09 LAB — CBC
HEMATOCRIT: 38.5 % (ref 36.0–46.0)
Hemoglobin: 12.6 g/dL (ref 12.0–15.0)
MCH: 31 pg (ref 26.0–34.0)
MCHC: 32.7 g/dL (ref 30.0–36.0)
MCV: 94.6 fL (ref 78.0–100.0)
PLATELETS: 328 10*3/uL (ref 150–400)
RBC: 4.07 MIL/uL (ref 3.87–5.11)
RDW: 12.5 % (ref 11.5–15.5)
WBC: 5.8 10*3/uL (ref 4.0–10.5)

## 2016-01-09 LAB — BASIC METABOLIC PANEL
ANION GAP: 8 (ref 5–15)
BUN: 9 mg/dL (ref 6–20)
CALCIUM: 8.4 mg/dL — AB (ref 8.9–10.3)
CO2: 29 mmol/L (ref 22–32)
CREATININE: 0.6 mg/dL (ref 0.44–1.00)
Chloride: 100 mmol/L — ABNORMAL LOW (ref 101–111)
GFR calc Af Amer: >60 mL/min (ref >60)
GFR calc non Af Amer: >60 mL/min (ref >60)
GLUCOSE: 124 mg/dL — AB (ref 65–99)
Potassium: 3.6 mmol/L (ref 3.5–5.1)
Sodium: 137 mmol/L (ref 135–145)

## 2016-01-09 MED ORDER — TRAMADOL-ACETAMINOPHEN 37.5-325 MG PO TABS
1.0000 | ORAL_TABLET | Freq: Four times a day (QID) | ORAL | Status: DC
  Administered 2016-01-09 – 2016-01-11 (×8): 1 via ORAL
  Filled 2016-01-09 (×8): qty 1

## 2016-01-09 MED ORDER — SODIUM CHLORIDE 0.9 % IV SOLN
5.0000 mg/kg | Freq: Once | INTRAVENOUS | Status: AC
  Administered 2016-01-09: 400 mg via INTRAVENOUS
  Filled 2016-01-09: qty 40

## 2016-01-09 NOTE — Progress Notes (Signed)
PROGRESS NOTE    [name]  [name]:[id number] DOB: 1940/10/22 DOA: 01/07/2016 PCP: Ann Held, DO   Chief Complaint  Patient presents with  . Rectal Bleeding    SINCE FRIDAY    Brief Narrative:  HPI on 01/07/2016 by Dr. Marzetta Board Sara Barnes is a 75 y.o. femalewith medical history significant of ulcerative colitis versus Crohn's disease, believed to be UC eventually, otherwise healthy, presents to the emergency room with chief complaint of severe abdominal pain for the past several days. She was recently hospitalized and discharged home about 9 days ago. She was here in the hospital with similar complaints, placed on steroids, and tells me today that she improved some and really wanted to go home. At home, she has had ongoing symptoms, never really well achieved full remission, and over the last few days she  has felt extremely unwell. She has crampy abdominal pain, nausea, poor appetite, barely able to keep down some Gatorade, and has been having significant amounts of bloody bowel movements as well. She has no fever or chills. She has no chest pain or shortness of breath. She denies any palpitations. She has no lightheadedness or dizziness. She feels generalized weakness. In the emergency room her vital signs are stable, her blood work is essentially unremarkable. Gastroenterology was consulted by EDP and have evaluated patient, and TRH was asked for admission  Assessment & Plan   Abdominal pain secondary to Ulcerative colitis flare with rectal bleeding -Gastroenterology consulted and appreciated -Hemoglobin currently stable -s/p flexible sigmoidoscopy on 01/08/16, biopsy pending.   If CMV is negative, GI plans to start remicaide  -Continue pain control, IVF, solumedrol  Diarrhea secondary to UC flare -Cdiff negative -Continue supportive care   Depression/Anxiety -Continue prozac  DVT Prophylaxis  Lovenox  Code Status: Full  Family Communication: none at  bedside  Disposition Plan: Admitted. Pending biopsy results.  Continue IV solumedrol and pain control.   Consultants Gastroenterology   Procedures  None  Antibiotics   Anti-infectives    None      Subjective:   Filomena Pokorney seen and examined today.  Continues to complain of abdominal pain but feels it is controlled with pain medications.  Was able to eat some today. Denies chest pain, shortness of breath, headache, dizziness.   Objective:   Vitals:   01/08/16 1540 01/08/16 1554 01/08/16 2018 01/09/16 0552  BP: (!) 145/59 (!) 144/64 130/66 129/76  Pulse: 69 67 72 63  Resp: 17 18 18 18   Temp:  98.4 F (36.9 C) 98.5 F (36.9 C) 97.6 F (36.4 C)  TempSrc:  Oral Oral Axillary  SpO2: 100% 99% 96% 97%  Weight:      Height:        Intake/Output Summary (Last 24 hours) at 01/09/16 1327 Last data filed at 01/09/16 0830  Gross per 24 hour  Intake              660 ml  Output                0 ml  Net              660 ml   Filed Weights   01/07/16 1136  Weight: 72.6 kg (160 lb)    Exam  General: Well developed, well nourished, NAD, appears stated age  HEENT: NCAT, mucous membranes moist.   Cardiovascular: S1 S2 auscultated, no rubs, murmurs or gallops. Regular rate and rhythm.  Respiratory: Clear to auscultation bilaterally with  equal chest rise  Abdomen: Soft, mild LLQ TTP, nondistended, + bowel sounds  Extremities: warm dry without cyanosis clubbing or edema  Neuro: AAOx3, nonfocal  Psych: Appropriate mood and affect   Data Reviewed: I have personally reviewed following labs and imaging studies  CBC:  Recent Labs Lab 01/07/16 1358 01/08/16 0603 01/09/16 0622  WBC 8.1 5.5 5.8  NEUTROABS 5.2  --   --   HGB 14.5 12.8 12.6  HCT 43.9 39.8 38.5  MCV 93.6 94.8 94.6  PLT 419* 349 408   Basic Metabolic Panel:  Recent Labs Lab 01/03/16 1257 01/07/16 1358 01/08/16 0603 01/09/16 0622  NA 134* 139 137 137  K 3.6 3.5 3.8 3.6  CL 99 103 103 100*   CO2 29 26 26 29   GLUCOSE 136* 104* 107* 124*  BUN 7 9 6 9   CREATININE 0.81 0.74 0.62 0.60  CALCIUM 8.6 8.6* 8.2* 8.4*   GFR: Estimated Creatinine Clearance: 62.9 mL/min (by C-G formula based on SCr of 0.6 mg/dL). Liver Function Tests:  Recent Labs Lab 01/08/16 0603  AST 11*  ALT 15  ALKPHOS 73  BILITOT 0.4  PROT 5.7*  ALBUMIN 2.4*   No results for input(s): LIPASE, AMYLASE in the last 168 hours. No results for input(s): AMMONIA in the last 168 hours. Coagulation Profile: No results for input(s): INR, PROTIME in the last 168 hours. Cardiac Enzymes: No results for input(s): CKTOTAL, CKMB, CKMBINDEX, TROPONINI in the last 168 hours. BNP (last 3 results) No results for input(s): PROBNP in the last 8760 hours. HbA1C: No results for input(s): HGBA1C in the last 72 hours. CBG: No results for input(s): GLUCAP in the last 168 hours. Lipid Profile: No results for input(s): CHOL, HDL, LDLCALC, TRIG, CHOLHDL, LDLDIRECT in the last 72 hours. Thyroid Function Tests: No results for input(s): TSH, T4TOTAL, FREET4, T3FREE, THYROIDAB in the last 72 hours. Anemia Panel: No results for input(s): VITAMINB12, FOLATE, FERRITIN, TIBC, IRON, RETICCTPCT in the last 72 hours. Urine analysis:    Component Value Date/Time   COLORURINE YELLOW 04/19/2015 0838   APPEARANCEUR CLOUDY (A) 04/19/2015 0838   LABSPEC 1.013 04/19/2015 0838   PHURINE 6.0 04/19/2015 0838   GLUCOSEU NEGATIVE 04/19/2015 0838   HGBUR MODERATE (A) 04/19/2015 0838   HGBUR small 12/13/2009 0904   BILIRUBINUR NEGATIVE 04/19/2015 0838   BILIRUBINUR ne 01/30/2015 1543   KETONESUR 40 (A) 04/19/2015 0838   PROTEINUR NEGATIVE 04/19/2015 0838   UROBILINOGEN 0.2 01/30/2015 1543   UROBILINOGEN 0.2 09/25/2013 1350   NITRITE NEGATIVE 04/19/2015 0838   LEUKOCYTESUR TRACE (A) 04/19/2015 0838   Sepsis Labs: @LABRCNTIP (procalcitonin:4,lacticidven:4)  ) Recent Results (from the past 240 hour(s))  C difficile quick scan w PCR reflex      Status: None   Collection Time: 01/07/16  3:05 PM  Result Value Ref Range Status   C Diff antigen NEGATIVE NEGATIVE Final   C Diff toxin NEGATIVE NEGATIVE Final   C Diff interpretation No C. difficile detected.  Final      Radiology Studies: Dg Abd Acute W/chest  Result Date: 01/07/2016 CLINICAL DATA:  Bloody diarrhea and left lower quadrant abdominal pain as well as rectal pain without fever or nausea and vomiting. Patient recently hospitalized for colitis. EXAM: DG ABDOMEN ACUTE W/ 1V CHEST COMPARISON:  CT scan of the chest of January 01, 2016 FINDINGS: Chest x-ray: The lungs are adequately inflated and clear. The patient has undergone previous breast surgery for malignancy. There is no alveolar infiltrate or pleural effusion. The heart  and pulmonary vascularity are normal. The bony structures exhibit no acute abnormality. Within the abdomen there are small amounts of gas within multiple normal caliber and an minimally distended small bowel loops. There is gas in the ascending colon. No rectal gas is observed. No free extraluminal gas collections are demonstrated. The bony structures exhibit no acute abnormalities. There are no abnormal soft tissue calcifications. IMPRESSION: The bowel gas pattern suggests an ileus or gastroenteritis type process. There is no evidence of obstruction or perforation. No acute cardiopulmonary abnormality. Electronically Signed   By: David  Martinique M.D.   On: 01/07/2016 13:49     Scheduled Meds: . chlorhexidine  15 mL Mouth/Throat BID  . dicyclomine  10 mg Oral TID AC & HS  . enoxaparin (LOVENOX) injection  40 mg Subcutaneous Q24H  . FLUoxetine  20 mg Oral Daily  . methylPREDNISolone (SOLU-MEDROL) injection  30 mg Intravenous Q12H  . traMADol-acetaminophen  1 tablet Oral QID   Continuous Infusions:    LOS: 1 day   Time Spent in minutes   30 minutes  Letisha Yera D.O. on 01/09/2016 at 1:27 PM  Between 7am to 7pm - Pager - [phone]  After 7pm  go to [email or web address] - password [email or web address]  And look for the night coverage person covering for me after hours  [facility] Office  [phone]

## 2016-01-09 NOTE — Progress Notes (Signed)
Patient ID: Sara Barnes, female   DOB: [birth year], 75 y.o.   MRN: [id number]    Progress Note   Subjective   Feels about the same -still with severe rectal pain but pain meds helping to control Able to eat a little   Objective   Vital signs in last 24 hours: Temp:  [97.6 F (36.4 C)-99.3 F (37.4 C)] 97.6 F (36.4 C) (09/13 0552) Pulse Rate:  [63-73] 63 (09/13 0552) Resp:  [14-21] 18 (09/13 0552) BP: (129-161)/(55-76) 129/76 (09/13 0552) SpO2:  [96 %-100 %] 97 % (09/13 0552) Last BM Date: 01/09/16 General:  elderly  white female in NAD-family at bedside Heart:  Regular rate and rhythm; no murmurs Lungs: Respirations even and unlabored, lungs CTA bilaterally Abdomen:  Soft, minimally tender LLQ and nondistended. Normal bowel sounds. Extremities:  Without edema. Neurologic:  Alert and oriented,  grossly normal neurologically. Psych:  Cooperative. Normal mood and affect.  Intake/Output from previous day: 09/12 0701 - 09/13 0700 In: 23 [P.O.:120; I.V.:300] Out: -  Intake/Output this shift: Total I/O In: 240 [P.O.:240] Out: -   Lab Results:  Recent Labs  01/07/16 1358 01/08/16 0603 01/09/16 0622  WBC 8.1 5.5 5.8  HGB 14.5 12.8 12.6  HCT 43.9 39.8 38.5  PLT 419* 349 328   BMET  Recent Labs  01/07/16 1358 01/08/16 0603 01/09/16 0622  NA 139 137 137  K 3.5 3.8 3.6  CL 103 103 100*  CO2 26 26 29   GLUCOSE 104* 107* 124*  BUN 9 6 9   CREATININE 0.74 0.62 0.60  CALCIUM 8.6* 8.2* 8.4*   LFT  Recent Labs  01/08/16 0603  PROT 5.7*  ALBUMIN 2.4*  AST 11*  ALT 15  ALKPHOS 73  BILITOT 0.4   PT/INR No results for input(s): LABPROT, INR in the last 72 hours.  Studies/Results: Dg Abd Acute W/chest  Result Date: 01/07/2016 CLINICAL DATA:  Bloody diarrhea and left lower quadrant abdominal pain as well as rectal pain without fever or nausea and vomiting. Patient recently hospitalized for colitis. EXAM: DG ABDOMEN ACUTE W/ 1V CHEST COMPARISON:  CT scan of  the chest of January 01, 2016 FINDINGS: Chest x-ray: The lungs are adequately inflated and clear. The patient has undergone previous breast surgery for malignancy. There is no alveolar infiltrate or pleural effusion. The heart and pulmonary vascularity are normal. The bony structures exhibit no acute abnormality. Within the abdomen there are small amounts of gas within multiple normal caliber and an minimally distended small bowel loops. There is gas in the ascending colon. No rectal gas is observed. No free extraluminal gas collections are demonstrated. The bony structures exhibit no acute abnormalities. There are no abnormal soft tissue calcifications. IMPRESSION: The bowel gas pattern suggests an ileus or gastroenteritis type process. There is no evidence of obstruction or perforation. No acute cardiopulmonary abnormality. Electronically Signed   By: David  Martinique M.D.   On: 01/07/2016 13:49     IMP/PLAN;  #1  75 yo female with severe ulcerative colitis involving rectum - deep ulcers in rectum on sigmoidoscopy yesterday Await bx to r/o superimposed CMV If bx negative plan to initiate  Remicade while in hospital Continue Solumedrol 30 mg q 12  Will start oral analgesic- continue IV Dilaudid prn - hope can transition to orals-will also avoid her having to wait for nursing staff for  IV pain meds    Active Problems:   Ulcerative pancolitis with rectal bleeding (HCC)   Abdominal pain   GI  bleed     LOS: 1 day   [name]  01/09/2016, 9:57 AM

## 2016-01-10 ENCOUNTER — Telehealth: Payer: Self-pay | Admitting: Internal Medicine

## 2016-01-10 NOTE — Progress Notes (Signed)
PROGRESS NOTE    Sara Barnes  AUQ:[id number] DOB: 09/02/1940 DOA: 01/07/2016 PCP: Ann Held, DO   Chief Complaint  Patient presents with  . Rectal Bleeding    SINCE FRIDAY    Brief Narrative:  HPI on 01/07/2016 by Dr. Marzetta Board Sara Barnes is a 75 y.o. femalewith medical history significant of ulcerative colitis versus Crohn's disease, believed to be UC eventually, otherwise healthy, presents to the emergency room with chief complaint of severe abdominal pain for the past several days. She was recently hospitalized and discharged home about 9 days ago. She was here in the hospital with similar complaints, placed on steroids, and tells me today that she improved some and really wanted to go home. At home, she has had ongoing symptoms, never really well achieved full remission, and over the last few days she  has felt extremely unwell. She has crampy abdominal pain, nausea, poor appetite, barely able to keep down some Gatorade, and has been having significant amounts of bloody bowel movements as well. She has no fever or chills. She has no chest pain or shortness of breath. She denies any palpitations. She has no lightheadedness or dizziness. She feels generalized weakness. In the emergency room her vital signs are stable, her blood work is essentially unremarkable. Gastroenterology was consulted by EDP and have evaluated patient, and TRH was asked for admission  Interim history Gastroneurology consultation appreciated. Patient underwent flexible sigmoidoscopy. CMV was negative and she was started on Remicade.  Assessment & Plan   Abdominal pain secondary to Ulcerative colitis flare with rectal bleeding -Gastroenterology consulted and appreciated -Hemoglobin currently stable -s/p flexible sigmoidoscopy on 01/08/16, biopsy Showed active erosive ulcer. No CMV found. Patient was started on Remicade on 01/09/2016 -Continue pain control, IVF, solumedrol  Diarrhea secondary to  UC flare -Cdiff negative -Continue supportive care   Depression/Anxiety -Continue prozac  DVT Prophylaxis  Lovenox  Code Status: Full  Family Communication: none at bedside  Disposition Plan: Admitted. Continue current treatment. Disposition ultimately will be guided by gastroenterology.  Consultants Gastroenterology   Procedures  None  Antibiotics   Anti-infectives    None      Subjective:   [name] seen and examined today.  Patient states she feels weak today. She has noted some blood with bowel movements as well as rectal pain however feels that bowel movements have improved. Continues to complain of abdominal pain but feels It has improved. Denies chest pain, shortness of breath, headache, dizziness.   Objective:   Vitals:   01/09/16 0552 01/09/16 1500 01/09/16 2036 01/10/16 0606  BP: 129/76 133/76 (!) 142/77 138/80  Pulse: 63 77 65 77  Resp: 18 18 18 18   Temp: 97.6 F (36.4 C) 97.7 F (36.5 C) 98.5 F (36.9 C) 98.7 F (37.1 C)  TempSrc: Axillary Axillary Oral Oral  SpO2: 97% 97% 96% 98%  Weight:      Height:        Intake/Output Summary (Last 24 hours) at 01/10/16 1122 Last data filed at 01/10/16 0607  Gross per 24 hour  Intake              540 ml  Output                0 ml  Net              540 ml   Filed Weights   01/07/16 1136  Weight: 72.6 kg (160 lb)    Exam  General: Well developed, well nourished, NAD  HEENT: NCAT, mucous membranes moist.   Cardiovascular: S1 S2 auscultated, RRR, no murmurs  Respiratory: Clear to auscultation bilaterally with equal chest rise  Abdomen: Soft, mildly tender, nondistended, + bowel sounds  Extremities: warm dry without cyanosis clubbing or edema  Neuro: AAOx3, nonfocal  Psych: Appropriate mood and affect, Pleasant   Data Reviewed: I have personally reviewed following labs and imaging studies  CBC:  Recent Labs Lab 01/07/16 1358 01/08/16 0603 01/09/16 0622  WBC 8.1 5.5 5.8    NEUTROABS 5.2  --   --   HGB 14.5 12.8 12.6  HCT 43.9 39.8 38.5  MCV 93.6 94.8 94.6  PLT 419* 349 683   Basic Metabolic Panel:  Recent Labs Lab 01/03/16 1257 01/07/16 1358 01/08/16 0603 01/09/16 0622  NA 134* 139 137 137  K 3.6 3.5 3.8 3.6  CL 99 103 103 100*  CO2 29 26 26 29   GLUCOSE 136* 104* 107* 124*  BUN 7 9 6 9   CREATININE 0.81 0.74 0.62 0.60  CALCIUM 8.6 8.6* 8.2* 8.4*   GFR: Estimated Creatinine Clearance: 62.9 mL/min (by C-G formula based on SCr of 0.6 mg/dL). Liver Function Tests:  Recent Labs Lab 01/08/16 0603  AST 11*  ALT 15  ALKPHOS 73  BILITOT 0.4  PROT 5.7*  ALBUMIN 2.4*   No results for input(s): LIPASE, AMYLASE in the last 168 hours. No results for input(s): AMMONIA in the last 168 hours. Coagulation Profile: No results for input(s): INR, PROTIME in the last 168 hours. Cardiac Enzymes: No results for input(s): CKTOTAL, CKMB, CKMBINDEX, TROPONINI in the last 168 hours. BNP (last 3 results) No results for input(s): PROBNP in the last 8760 hours. HbA1C: No results for input(s): HGBA1C in the last 72 hours. CBG: No results for input(s): GLUCAP in the last 168 hours. Lipid Profile: No results for input(s): CHOL, HDL, LDLCALC, TRIG, CHOLHDL, LDLDIRECT in the last 72 hours. Thyroid Function Tests: No results for input(s): TSH, T4TOTAL, FREET4, T3FREE, THYROIDAB in the last 72 hours. Anemia Panel: No results for input(s): VITAMINB12, FOLATE, FERRITIN, TIBC, IRON, RETICCTPCT in the last 72 hours. Urine analysis:    Component Value Date/Time   COLORURINE YELLOW 04/19/2015 0838   APPEARANCEUR CLOUDY (A) 04/19/2015 0838   LABSPEC 1.013 04/19/2015 0838   PHURINE 6.0 04/19/2015 0838   GLUCOSEU NEGATIVE 04/19/2015 0838   HGBUR MODERATE (A) 04/19/2015 0838   HGBUR small 12/13/2009 0904   BILIRUBINUR NEGATIVE 04/19/2015 0838   BILIRUBINUR ne 01/30/2015 1543   KETONESUR 40 (A) 04/19/2015 0838   PROTEINUR NEGATIVE 04/19/2015 0838   UROBILINOGEN  0.2 01/30/2015 1543   UROBILINOGEN 0.2 09/25/2013 1350   NITRITE NEGATIVE 04/19/2015 0838   LEUKOCYTESUR TRACE (A) 04/19/2015 0838   Sepsis Labs: @LABRCNTIP (procalcitonin:4,lacticidven:4)  ) Recent Results (from the past 240 hour(s))  C difficile quick scan w PCR reflex     Status: None   Collection Time: 01/07/16  3:05 PM  Result Value Ref Range Status   C Diff antigen NEGATIVE NEGATIVE Final   C Diff toxin NEGATIVE NEGATIVE Final   C Diff interpretation No C. difficile detected.  Final      Radiology Studies: No results found.   Scheduled Meds: . chlorhexidine  15 mL Mouth/Throat BID  . dicyclomine  10 mg Oral TID AC & HS  . enoxaparin (LOVENOX) injection  40 mg Subcutaneous Q24H  . FLUoxetine  20 mg Oral Daily  . methylPREDNISolone (SOLU-MEDROL) injection  30 mg Intravenous Q12H  .  traMADol-acetaminophen  1 tablet Oral QID   Continuous Infusions:    LOS: 2 days   Time Spent in minutes   30 minutes  Schuyler Olden D.O. on 01/10/2016 at 11:22 AM  Between 7am to 7pm - Pager - [phone]  After 7pm go to [email or web address] - password [email or web address]  And look for the night coverage person covering for me after hours  [facility] Office  [phone]

## 2016-01-10 NOTE — Progress Notes (Signed)
Small amt bloody stool at 0600 hgb 12.6 requested pain/nausea med at that time  Relief noted

## 2016-01-10 NOTE — Progress Notes (Signed)
Patient ID: Sara Barnes, female   DOB: 1940/07/13, 75 y.o.   MRN: [id number]    Progress Note   Subjective   Feels Ok a little weak today. Tolerated Remicade infusion well. Still having squirts of blood and rectal pain but BM's less frequent. She is taking Tramadol around the clock, and able to decrease frequency of Dilaudid use   Objective   Vital signs in last 24 hours: Temp:  [97.7 F (36.5 C)-98.7 F (37.1 C)] 98.7 F (37.1 C) (09/14 0606) Pulse Rate:  [65-77] 77 (09/14 0606) Resp:  [18] 18 (09/14 0606) BP: (133-142)/(76-80) 138/80 (09/14 0606) SpO2:  [96 %-98 %] 98 % (09/14 0606) Last BM Date: 01/09/16 General: plder    white female in NAD Heart:  Regular rate and rhythm; no murmurs Lungs: Respirations even and unlabored, lungs CTA bilaterally Abdomen:  Soft, nontender and nondistended. Normal bowel sounds. Extremities:  Without edema. Neurologic:  Alert and oriented,  grossly normal neurologically. Psych:  Cooperative. Normal mood and affect.  Intake/Output from previous day: 09/13 0701 - 09/14 0700 In: 780 [P.O.:780] Out: -  Intake/Output this shift: No intake/output data recorded.  Lab Results:  Recent Labs  01/07/16 1358 01/08/16 0603 01/09/16 0622  WBC 8.1 5.5 5.8  HGB 14.5 12.8 12.6  HCT 43.9 39.8 38.5  PLT 419* 349 328   BMET  Recent Labs  01/07/16 1358 01/08/16 0603 01/09/16 0622  NA 139 137 137  K 3.5 3.8 3.6  CL 103 103 100*  CO2 26 26 29   GLUCOSE 104* 107* 124*  BUN 9 6 9   CREATININE 0.74 0.62 0.60  CALCIUM 8.6* 8.2* 8.4*   LFT  Recent Labs  01/08/16 0603  PROT 5.7*  ALBUMIN 2.4*  AST 11*  ALT 15  ALKPHOS 73  BILITOT 0.4   PT/INR No results for input(s): LABPROT, INR in the last 72 hours.  Studies/Results: No results found.     Assessment / Plan:    #1 75 yo female with severe ulcerative colitis with multiple deep rectal ulcerations and severe rectal pain- refractory to steroids. Some suspicion that she may have  Crohns given current appearance on Flex Bx from Flex showed no evidence of CMV. First Remicade  Infusion was given last pm  Continue current Regimen of Solumedrol  Today, and pain control May need a few days to see response from Remicade   Active Problems:   Ulcerative pancolitis with rectal bleeding (HCC)   Abdominal pain   GI bleed     LOS: 2 days   Sara Barnes  01/10/2016, 9:38 AM

## 2016-01-10 NOTE — Telephone Encounter (Signed)
Pt has Medicare and cannot get assistance from [employer]. [employer] states they did not receive a tax return from the pt, need to verify income with pt. Need to call [employer] at [phone] to do this. [facility] infusion was going to run 20% co-insurance for pt which would have been [phone]/infuion. They schedule their pts with this type of benefit at the hospital because they hospital will bill the pt. Stated that pt should sign up at open enrollment for Medicare with Supplement F and then should have coverage at 100%. Left message for pts sister to call back.  Spoke with pts sister and she is aware. Also discussed they may want to request to speak with a social worker while pt in the hospital to see if she may qualify for Medicaid and get help signing up for Medicaid.

## 2016-01-11 LAB — CBC
HCT: 39.7 % (ref 36.0–46.0)
HEMOGLOBIN: 13.3 g/dL (ref 12.0–15.0)
MCH: 30.8 pg (ref 26.0–34.0)
MCHC: 33.5 g/dL (ref 30.0–36.0)
MCV: 91.9 fL (ref 78.0–100.0)
Platelets: 333 10*3/uL (ref 150–400)
RBC: 4.32 MIL/uL (ref 3.87–5.11)
RDW: 12.2 % (ref 11.5–15.5)
WBC: 9.6 10*3/uL (ref 4.0–10.5)

## 2016-01-11 LAB — BASIC METABOLIC PANEL
ANION GAP: 9 (ref 5–15)
BUN: 10 mg/dL (ref 6–20)
CALCIUM: 8.7 mg/dL — AB (ref 8.9–10.3)
CO2: 30 mmol/L (ref 22–32)
CREATININE: 0.65 mg/dL (ref 0.44–1.00)
Chloride: 98 mmol/L — ABNORMAL LOW (ref 101–111)
GFR calc non Af Amer: >60 mL/min (ref >60)
Glucose, Bld: 99 mg/dL (ref 65–99)
Potassium: 3.9 mmol/L (ref 3.5–5.1)
SODIUM: 137 mmol/L (ref 135–145)

## 2016-01-11 LAB — C-REACTIVE PROTEIN: CRP: 3.3 mg/dL — AB (ref <1.0)

## 2016-01-11 MED ORDER — HYDROCODONE-ACETAMINOPHEN 5-325 MG PO TABS
1.0000 | ORAL_TABLET | ORAL | Status: DC | PRN
  Administered 2016-01-11 – 2016-01-12 (×5): 2 via ORAL
  Filled 2016-01-11 (×6): qty 2

## 2016-01-11 MED ORDER — HYDROMORPHONE HCL 1 MG/ML IJ SOLN
0.5000 mg | INTRAMUSCULAR | Status: DC | PRN
  Administered 2016-01-12 (×2): 0.5 mg via INTRAVENOUS
  Filled 2016-01-11 (×2): qty 1

## 2016-01-11 NOTE — Progress Notes (Signed)
PROGRESS NOTE    Sara Barnes  HLK:[id number] DOB: Apr 01, 1941 DOA: 01/07/2016 PCP: Ann Held, DO   Chief Complaint  Patient presents with  . Rectal Bleeding    SINCE FRIDAY    Brief Narrative:  HPI on 01/07/2016 by Dr. Marzetta Board Sara Barnes is a 75 y.o. femalewith medical history significant of ulcerative colitis versus Crohn's disease, believed to be UC eventually, otherwise healthy, presents to the emergency room with chief complaint of severe abdominal pain for the past several days. She was recently hospitalized and discharged home about 9 days prior to admission. She was here in the hospital with similar complaints, placed on steroids, and tells me today that she improved some and really wanted to go home. At home, she has had ongoing symptoms, never really well achieved full remission, and over the last few days she has felt extremely unwell. She has crampy abdominal pain, nausea, poor appetite, barely able to keep down some Gatorade, and has been having significant amounts of bloody bowel movements as well. She has no fever or chills. She has no chest pain or shortness of breath. She denies any palpitations. She has no lightheadedness or dizziness. She feels generalized weakness. In the emergency room her vital signs are stable, her blood work is essentially unremarkable. Gastroenterology was consulted by EDP and have evaluated patient, and TRH was asked for admission  Gastroenterology consultation appreciated. Patient underwent flexible sigmoidoscopy. CMV was negative and she was started on Remicade. She is doing better and tolerating some diet, GI following and plans to transition to PO steroids in the next day. Minimal nausea, has been ambulating.  Assessment & Plan   Abdominal pain secondary to Ulcerative colitis flare with rectal bleeding -Gastroenterology following -Hemoglobin currently stable -s/p flexible sigmoidoscopy on 01/08/16, biopsy Showed active erosive  ulcer. No CMV found. Patient was started on Remicade on 01/09/2016 and is improving -Continue pain control, IVF, solumedrol  Diarrhea secondary to UC flare -Cdiff negative -Continue supportive care   Depression/Anxiety -Continue prozac  DVT Prophylaxis  Lovenox  Code Status: Full  Family Communication: none at bedside  Disposition Plan: Admitted. Continue current treatment. Disposition ultimately will be guided by gastroenterology. Possibly home in next 24-48 hours.  Consultants Gastroenterology   Procedures  None  Antibiotics   Anti-infectives    None      Subjective:   Kimbery Harwood seen and examined today.  She is doing better and tolerating some diet, GI following and plans to transition to PO steroids in the next day. Minimal nausea, has been ambulating.  Objective:   Vitals:   01/10/16 0606 01/10/16 1402 01/10/16 2135 01/11/16 0552  BP: 138/80 (!) 154/77 (!) 158/83 (!) 157/81  Pulse: 77 62 61 (!) 58  Resp: 18 18 18 18   Temp: 98.7 F (37.1 C) 97.8 F (36.6 C) 98.8 F (37.1 C) 98.1 F (36.7 C)  TempSrc: Oral Oral Oral Oral  SpO2: 98% 97% 97% 100%  Weight:      Height:       No intake or output data in the 24 hours ending 01/11/16 1539 Filed Weights   01/07/16 1136  Weight: 72.6 kg (160 lb)    Exam  General: Well developed, well nourished, NAD  HEENT: NCAT, mucous membranes moist.   Cardiovascular: S1 S2 auscultated, RRR, no murmurs  Respiratory: Clear to auscultation bilaterally with equal chest rise  Abdomen: Soft, mildly tender, nondistended, + bowel sounds  Extremities: warm dry without cyanosis clubbing or  edema  Neuro: AAOx3, nonfocal  Psych: Appropriate mood and affect, Pleasant   Data Reviewed: I have personally reviewed following labs and imaging studies  CBC:  Recent Labs Lab 01/07/16 1358 01/08/16 0603 01/09/16 0622 01/11/16 0554  WBC 8.1 5.5 5.8 9.6  NEUTROABS 5.2  --   --   --   HGB 14.5 12.8 12.6 13.3  HCT 43.9  39.8 38.5 39.7  MCV 93.6 94.8 94.6 91.9  PLT 419* 349 328 416   Basic Metabolic Panel:  Recent Labs Lab 01/07/16 1358 01/08/16 0603 01/09/16 0622 01/11/16 0554  NA 139 137 137 137  K 3.5 3.8 3.6 3.9  CL 103 103 100* 98*  CO2 26 26 29 30   GLUCOSE 104* 107* 124* 99  BUN 9 6 9 10   CREATININE 0.74 0.62 0.60 0.65  CALCIUM 8.6* 8.2* 8.4* 8.7*   GFR: Estimated Creatinine Clearance: 62.9 mL/min (by C-G formula based on SCr of 0.65 mg/dL). Liver Function Tests:  Recent Labs Lab 01/08/16 0603  AST 11*  ALT 15  ALKPHOS 73  BILITOT 0.4  PROT 5.7*  ALBUMIN 2.4*   No results for input(s): LIPASE, AMYLASE in the last 168 hours. No results for input(s): AMMONIA in the last 168 hours. Coagulation Profile: No results for input(s): INR, PROTIME in the last 168 hours. Cardiac Enzymes: No results for input(s): CKTOTAL, CKMB, CKMBINDEX, TROPONINI in the last 168 hours. BNP (last 3 results) No results for input(s): PROBNP in the last 8760 hours. HbA1C: No results for input(s): HGBA1C in the last 72 hours. CBG: No results for input(s): GLUCAP in the last 168 hours. Lipid Profile: No results for input(s): CHOL, HDL, LDLCALC, TRIG, CHOLHDL, LDLDIRECT in the last 72 hours. Thyroid Function Tests: No results for input(s): TSH, T4TOTAL, FREET4, T3FREE, THYROIDAB in the last 72 hours. Anemia Panel: No results for input(s): VITAMINB12, FOLATE, FERRITIN, TIBC, IRON, RETICCTPCT in the last 72 hours. Urine analysis:    Component Value Date/Time   COLORURINE YELLOW 04/19/2015 0838   APPEARANCEUR CLOUDY (A) 04/19/2015 0838   LABSPEC 1.013 04/19/2015 0838   PHURINE 6.0 04/19/2015 0838   GLUCOSEU NEGATIVE 04/19/2015 0838   HGBUR MODERATE (A) 04/19/2015 0838   HGBUR small 12/13/2009 0904   BILIRUBINUR NEGATIVE 04/19/2015 0838   BILIRUBINUR ne 01/30/2015 1543   KETONESUR 40 (A) 04/19/2015 0838   PROTEINUR NEGATIVE 04/19/2015 0838   UROBILINOGEN 0.2 01/30/2015 1543   UROBILINOGEN 0.2  09/25/2013 1350   NITRITE NEGATIVE 04/19/2015 0838   LEUKOCYTESUR TRACE (A) 04/19/2015 0838   Sepsis Labs: @LABRCNTIP (procalcitonin:4,lacticidven:4)  ) Recent Results (from the past 240 hour(s))  C difficile quick scan w PCR reflex     Status: None   Collection Time: 01/07/16  3:05 PM  Result Value Ref Range Status   C Diff antigen NEGATIVE NEGATIVE Final   C Diff toxin NEGATIVE NEGATIVE Final   C Diff interpretation No C. difficile detected.  Final      Radiology Studies: No results found.   Scheduled Meds: . chlorhexidine  15 mL Mouth/Throat BID  . dicyclomine  10 mg Oral TID AC & HS  . enoxaparin (LOVENOX) injection  40 mg Subcutaneous Q24H  . FLUoxetine  20 mg Oral Daily  . methylPREDNISolone (SOLU-MEDROL) injection  30 mg Intravenous Q12H   Continuous Infusions:    LOS: 3 days   Time Spent in minutes   31 minutes  Mir Marry Guan D.O. on 01/11/2016 at 3:39 PM  Between 7am to 7pm - Pager - [phone]  After 7pm go to [email or web address] - password [email or web address]  And look for the night coverage person covering for me after hours  [facility] Office  [phone]

## 2016-01-11 NOTE — Care Management Note (Signed)
Case Management Note  Patient Details  Name: Sara Barnes MRN: [id number] Date of Birth: [birth year]  Subjective/Objective:             crohns diease and flare       Action/Plan: home with stable   Expected Discharge Date:   (unknown)               Expected Discharge Plan:  Home/Self Care  In-House Referral:     Discharge planning Services     Post Acute Care Choice:    Choice offered to:     DME Arranged:    DME Agency:     HH Arranged:    [place] Agency:     Status of Service:  In process, will continue to follow  If discussed at Long Length of Stay Meetings, dates discussed:    Additional Comments:Date:  January 11, 2016 Chart reviewed for concurrent status and case management needs. Will continue to follow the patient for status change: Discharge Planning: following for needs Expected discharge date: [id number] [name], BSN, [place], [place]  Leeroy Cha, RN 01/11/2016, 10:41 AM

## 2016-01-11 NOTE — Progress Notes (Signed)
Patient ID: Sara Barnes, female   DOB: [birth year], 75 y.o.   MRN: [id number]    Progress Note   Subjective   Had increased pain last pm after dinner with 5  episodes of tenesmus and small volume mucoid/blood per rectum. Only one BM this am thus far. Says she is no longer having any abdominal discomfort, and is more comfortable with less rectal pain in between BM's   CRP down to 3.3   Objective   Vital signs in last 24 hours: Temp:  [97.8 F (36.6 C)-98.8 F (37.1 C)] 98.1 F (36.7 C) (09/15 0552) Pulse Rate:  [58-62] 58 (09/15 0552) Resp:  [18] 18 (09/15 0552) BP: (154-158)/(77-83) 157/81 (09/15 0552) SpO2:  [97 %-100 %] 100 % (09/15 0552) Last BM Date: 01/10/16 General:   Older  white female in NAD Heart:  Regular rate and rhythm; no murmurs Lungs: Respirations even and unlabored, lungs CTA bilaterally Abdomen:  Soft, nontender and nondistended. Normal bowel sounds. Extremities:  Without edema. Neurologic:  Alert and oriented,  grossly normal neurologically. Psych:  Cooperative. Normal mood and affect.  Intake/Output from previous day: No intake/output data recorded. Intake/Output this shift: No intake/output data recorded.  Lab Results:  Recent Labs  01/09/16 0622 01/11/16 0554  WBC 5.8 9.6  HGB 12.6 13.3  HCT 38.5 39.7  PLT 328 333   BMET  Recent Labs  01/09/16 0622 01/11/16 0554  NA 137 137  K 3.6 3.9  CL 100* 98*  CO2 29 30  GLUCOSE 124* 99  BUN 9 10  CREATININE 0.60 0.65  CALCIUM 8.4* 8.7*   LFT No results for input(s): PROT, ALBUMIN, AST, ALT, ALKPHOS, BILITOT, BILIDIR, IBILI in the last 72 hours. PT/INR No results for input(s): LABPROT, INR in the last 72 hours.       Assessment / Plan:    #1  75 yo female with severe colitis with rectal involvement  And deep ulcers - unclear if this is UC or Crohns .  Pt is s/p initial Remicade infusion 9/13 I think she is seeing some mild improvement- CRP down to 3.3!  Plan; Continue Solumedrol  today, will convert to oral steroids in am Long discussion regarding pain management- she is not allergic to codeine -and had tolerated Vicodin fine in recent past- will start her on  Vicodin  Today, stop tramadol, and work on weaning from Dilaudid(will decrease dose) Hopeful she can be discharged home in 48 hours   Active Problems:   Ulcerative pancolitis with rectal bleeding (HCC)   Abdominal pain   GI bleed     LOS: 3 days   [name]  01/11/2016, 8:48 AM

## 2016-01-12 LAB — CBC
HCT: 37.5 % (ref 36.0–46.0)
HEMOGLOBIN: 12.2 g/dL (ref 12.0–15.0)
MCH: 30.7 pg (ref 26.0–34.0)
MCHC: 32.5 g/dL (ref 30.0–36.0)
MCV: 94.2 fL (ref 78.0–100.0)
Platelets: 295 10*3/uL (ref 150–400)
RBC: 3.98 MIL/uL (ref 3.87–5.11)
RDW: 12.6 % (ref 11.5–15.5)
WBC: 6.4 10*3/uL (ref 4.0–10.5)

## 2016-01-12 MED ORDER — HYDROMORPHONE HCL 1 MG/ML IJ SOLN
1.0000 mg | INTRAMUSCULAR | Status: DC | PRN
  Administered 2016-01-12 – 2016-01-17 (×32): 1 mg via INTRAVENOUS
  Filled 2016-01-12 (×33): qty 1

## 2016-01-12 NOTE — Progress Notes (Signed)
Patient ID: Sara Barnes, female   DOB: February 06, 1941, 75 y.o.   MRN: [id number]    Progress Note   Subjective   Horrible day yesterday and last night.influenza tears this am- oral Vicodin did not help her pain at all. Says she had multiple episodes of stools/spasm/blood .. Maybe some small clots. She was incontinent as well.  No significant abdominal pain.  day #3 post Remicade   Objective   Vital signs in last 24 hours: Temp:  [97.8 F (36.6 C)-98 F (36.7 C)] 97.8 F (36.6 C) (09/16 0445) Pulse Rate:  [60-63] 63 (09/16 0445) Resp:  [18] 18 (09/16 0445) BP: (142-152)/(75-84) 152/84 (09/16 0445) SpO2:  [95 %-98 %] 98 % (09/16 0445) Last BM Date: 01/12/16 General: older    white female in NAD in  Tears  Heart:  Regular rate and rhythm; no murmurs Lungs: Respirations even and unlabored, lungs CTA bilaterally Abdomen:  Soft, nontender and nondistended. Normal bowel sounds. Extremities:  Without edema. Neurologic:  Alert and oriented,  grossly normal neurologically. Psych:  Cooperative. Normal mood and affect.  Intake/Output from previous day: 09/15 0701 - 09/16 0700 In: 360 [P.O.:360] Out: -  Intake/Output this shift: No intake/output data recorded.  Lab Results:  Recent Labs  01/11/16 0554  WBC 9.6  HGB 13.3  HCT 39.7  PLT 333   BMET  Recent Labs  01/11/16 0554  NA 137  K 3.9  CL 98*  CO2 30  GLUCOSE 99  BUN 10  CREATININE 0.65  CALCIUM 8.7*   LFT No results for input(s): PROT, ALBUMIN, AST, ALT, ALKPHOS, BILITOT, BILIDIR, IBILI in the last 72 hours. PT/INR No results for input(s): LABPROT, INR in the last 72 hours.       Assessment / Plan:    #1 75 yo WF with severe refractory UC vs Crohns with rectal involvement  And multiple deep rectal ulcers- Worsening of sxs yesterday and last pm with conversion to oral pain meds which did not seem to be effective at all CRP has come down so feel she has had initial response to Remicade , however past 24  hours back to multiple episodes of stooling/tenesmus  Continue Solumedrol 30 q 12 Will go back to IV Dialudid  q2-3 prn  Stop Vicodin Consider second dose of Remicade -will discuss, also may need repeat Flex to reassess if no progress this weekend.  Active Problems:   Ulcerative pancolitis with rectal bleeding (HCC)   Abdominal pain   GI bleed     LOS: 4 days   Sara Barnes  01/12/2016, 8:37 AM

## 2016-01-12 NOTE — Progress Notes (Signed)
PROGRESS NOTE    Sara Barnes  ERD:[id number] DOB: Jul 21, 1940 DOA: 01/07/2016 PCP: Ann Held, DO   Chief Complaint  Patient presents with  . Rectal Bleeding    SINCE FRIDAY    Brief Narrative:  HPI on 01/07/2016 by Dr. Marzetta [name] is a 75 y.o. femalewith medical history significant of ulcerative colitis versus Crohn's disease, believed to be UC eventually, otherwise healthy, presents to the emergency room with chief complaint of severe abdominal pain for the past several days. She was recently hospitalized and discharged home about 9 days prior to admission. She was here in the hospital with similar complaints, placed on steroids, and tells me today that she improved some and really wanted to go home. At home, she has had ongoing symptoms, never really well achieved full remission, and over the last few days she has felt extremely unwell. She has crampy abdominal pain, nausea, poor appetite, barely able to keep down some Gatorade, and has been having significant amounts of bloody bowel movements as well. She has no fever or chills. She has no chest pain or shortness of breath. She denies any palpitations. She has no lightheadedness or dizziness. She feels generalized weakness. In the emergency room her vital signs are stable, her blood work is essentially unremarkable. Gastroenterology was consulted by EDP and have evaluated patient, and [facility] was asked for admission  Gastroenterology consultation appreciated. Patient underwent flexible sigmoidoscopy. CMV was negative and she was started on Remicade. She was initially doing better and tolerating some diet, GI following and since having more pain and bloody BMs in last 24 hours will keep on IV steroids. Depending on course may qualify for another infusion of Remicade. Minimal nausea, has been ambulating.  Assessment & Plan   Abdominal pain secondary to Ulcerative colitis flare with rectal bleeding -Gastroenterology  following -Hemoglobin currently stable -s/p flexible sigmoidoscopy on 01/08/16, biopsy Showed active erosive ulcer. No CMV found. Patient was started on Remicade on 01/09/2016 and is improving -Continue pain control, IVF, solumedrol  Diarrhea secondary to UC flare -Cdiff negative -Continue supportive care   Depression/Anxiety -Continue prozac  DVT Prophylaxis  Lovenox  Code Status: Full  Family Communication: none at bedside  Disposition Plan: Admitted. Continue current treatment. Disposition ultimately will be guided by gastroenterology. Possibly home in next 24-48 hours.  Consultants Gastroenterology   Procedures  None  Antibiotics   Anti-infectives    None      Subjective:   [name] seen and examined today.  Having a lot of rectal pain, multiple bloody BMs since yesterday.  Objective:   Vitals:   01/11/16 1603 01/11/16 2136 01/12/16 0445 01/12/16 0934  BP: (!) 143/75 (!) 142/77 (!) 152/84 136/66  Pulse: 62 60 63 68  Resp: 18 18 18 17   Temp: 97.9 F (36.6 C) 98 F (36.7 C) 97.8 F (36.6 C) 98.4 F (36.9 C)  TempSrc: Oral Oral Oral Oral  SpO2: 97% 95% 98% 100%  Weight:      Height:        Intake/Output Summary (Last 24 hours) at 01/12/16 1307 Last data filed at 01/12/16 1226  Gross per 24 hour  Intake              245 ml  Output                0 ml  Net              245 ml   Filed  Weights   01/07/16 1136  Weight: 72.6 kg (160 lb)    Exam  General: Well developed, well nourished, NAD  HEENT: NCAT, mucous membranes moist.   Cardiovascular: S1 S2 auscultated, RRR, no murmurs  Respiratory: Clear to auscultation bilaterally with equal chest rise  Abdomen: Soft, mildly tender, nondistended, + bowel sounds  Extremities: warm dry without cyanosis clubbing or edema  Neuro: AAOx3, nonfocal  Psych: Appropriate mood and affect, Pleasant   Data Reviewed: I have personally reviewed following labs and imaging studies  CBC:  Recent  Labs Lab 01/07/16 1358 01/08/16 0603 01/09/16 0622 01/11/16 0554 01/12/16 0913  WBC 8.1 5.5 5.8 9.6 6.4  NEUTROABS 5.2  --   --   --   --   HGB 14.5 12.8 12.6 13.3 12.2  HCT 43.9 39.8 38.5 39.7 37.5  MCV 93.6 94.8 94.6 91.9 94.2  PLT 419* 349 328 333 678   Basic Metabolic Panel:  Recent Labs Lab 01/07/16 1358 01/08/16 0603 01/09/16 0622 01/11/16 0554  NA 139 137 137 137  K 3.5 3.8 3.6 3.9  CL 103 103 100* 98*  CO2 26 26 29 30   GLUCOSE 104* 107* 124* 99  BUN 9 6 9 10   CREATININE 0.74 0.62 0.60 0.65  CALCIUM 8.6* 8.2* 8.4* 8.7*   GFR: Estimated Creatinine Clearance: 62.9 mL/min (by C-G formula based on SCr of 0.65 mg/dL). Liver Function Tests:  Recent Labs Lab 01/08/16 0603  AST 11*  ALT 15  ALKPHOS 73  BILITOT 0.4  PROT 5.7*  ALBUMIN 2.4*   No results for input(s): LIPASE, AMYLASE in the last 168 hours. No results for input(s): AMMONIA in the last 168 hours. Coagulation Profile: No results for input(s): INR, PROTIME in the last 168 hours. Cardiac Enzymes: No results for input(s): CKTOTAL, CKMB, CKMBINDEX, TROPONINI in the last 168 hours. BNP (last 3 results) No results for input(s): PROBNP in the last 8760 hours. HbA1C: No results for input(s): HGBA1C in the last 72 hours. CBG: No results for input(s): GLUCAP in the last 168 hours. Lipid Profile: No results for input(s): CHOL, HDL, LDLCALC, TRIG, CHOLHDL, LDLDIRECT in the last 72 hours. Thyroid Function Tests: No results for input(s): TSH, T4TOTAL, FREET4, T3FREE, THYROIDAB in the last 72 hours. Anemia Panel: No results for input(s): VITAMINB12, FOLATE, FERRITIN, TIBC, IRON, RETICCTPCT in the last 72 hours. Urine analysis:    Component Value Date/Time   COLORURINE YELLOW 04/19/2015 0838   APPEARANCEUR CLOUDY (A) 04/19/2015 0838   LABSPEC 1.013 04/19/2015 0838   PHURINE 6.0 04/19/2015 0838   GLUCOSEU NEGATIVE 04/19/2015 0838   HGBUR MODERATE (A) 04/19/2015 0838   HGBUR small 12/13/2009 0904    BILIRUBINUR NEGATIVE 04/19/2015 0838   BILIRUBINUR ne 01/30/2015 1543   KETONESUR 40 (A) 04/19/2015 0838   PROTEINUR NEGATIVE 04/19/2015 0838   UROBILINOGEN 0.2 01/30/2015 1543   UROBILINOGEN 0.2 09/25/2013 1350   NITRITE NEGATIVE 04/19/2015 0838   LEUKOCYTESUR TRACE (A) 04/19/2015 0838   Sepsis Labs: @LABRCNTIP (procalcitonin:4,lacticidven:4)  ) Recent Results (from the past 240 hour(s))  C difficile quick scan w PCR reflex     Status: None   Collection Time: 01/07/16  3:05 PM  Result Value Ref Range Status   C Diff antigen NEGATIVE NEGATIVE Final   C Diff toxin NEGATIVE NEGATIVE Final   C Diff interpretation No C. difficile detected.  Final      Radiology Studies: No results found.   Scheduled Meds: . chlorhexidine  15 mL Mouth/Throat BID  . dicyclomine  10 mg Oral TID AC & HS  . enoxaparin (LOVENOX) injection  40 mg Subcutaneous Q24H  . FLUoxetine  20 mg Oral Daily  . methylPREDNISolone (SOLU-MEDROL) injection  30 mg Intravenous Q12H   Continuous Infusions:    LOS: 4 days   Time Spent in minutes   28 minutes  Mir Marry Guan MD on 01/12/2016 at 1:07 PM  Between 7am to 7pm - Pager - [phone]  After 7pm go to [email or web address] - password [email or web address]  And look for the night coverage person covering for me after hours  [facility] Office  [phone]

## 2016-01-13 LAB — C-REACTIVE PROTEIN: CRP: 3.1 mg/dL — ABNORMAL HIGH (ref <1.0)

## 2016-01-13 MED ORDER — HYDROCODONE-ACETAMINOPHEN 5-325 MG PO TABS
1.0000 | ORAL_TABLET | ORAL | Status: DC | PRN
  Administered 2016-01-13 – 2016-01-14 (×4): 2 via ORAL
  Administered 2016-01-14: 1 via ORAL
  Administered 2016-01-14 (×3): 2 via ORAL
  Administered 2016-01-14: 1 via ORAL
  Administered 2016-01-15 – 2016-01-17 (×12): 2 via ORAL
  Filled 2016-01-13 (×22): qty 2

## 2016-01-13 MED ORDER — INFLIXIMAB 100 MG IV SOLR
5.0000 mg/kg | Freq: Once | INTRAVENOUS | Status: AC
  Administered 2016-01-13: 400 mg via INTRAVENOUS
  Filled 2016-01-13: qty 40

## 2016-01-13 NOTE — Progress Notes (Signed)
Patient ID: Sara Barnes, female   DOB: February 04, 1941, 75 y.o.   MRN: [id number]    Progress Note   Subjective   Pain is manageable with Dilaudid , more comfortable. Still having frequent episodes of rectal spasm, small volume stools with blood- incontinent x 3 during night . Has had 4 small Bm's this am Eating , but generally eating about half of meal  CRP  pending  Day  #4 post  Remicade 59m/kg   First dose   Objective   Vital signs in last 24 hours: Temp:  [97.7 F (36.5 C)-98.4 F (36.9 C)] 98.4 F (36.9 C) (09/17 0830) Pulse Rate:  [65-84] 84 (09/17 0830) Resp:  [16-18] 16 (09/17 0830) BP: (108-129)/(58-76) 108/58 (09/17 0830) SpO2:  [94 %-98 %] 94 % (09/17 0830) Last BM Date: 01/13/16 General:  Older   white female in NAD Heart:  Regular rate and rhythm; no murmurs Lungs: Respirations even and unlabored, lungs CTA bilaterally Abdomen:  Soft, minimally LLQ tender and nondistended. Normal bowel sounds. Extremities:  Without edema. Neurologic:  Alert and oriented,  grossly normal neurologically. Psych:  Cooperative. Normal mood and affect.  Intake/Output from previous day: 09/16 0701 - 09/17 0700 In: 485 [P.O.:485] Out: -  Intake/Output this shift: Total I/O In: 100 [P.O.:100] Out: -   Lab Results:  Recent Labs  01/11/16 0554 01/12/16 0913  WBC 9.6 6.4  HGB 13.3 12.2  HCT 39.7 37.5  PLT 333 295   BMET  Recent Labs  01/11/16 0554  NA 137  K 3.9  CL 98*  CO2 30  GLUCOSE 99  BUN 10  CREATININE 0.65  CALCIUM 8.7*   LFT No results for input(s): PROT, ALBUMIN, AST, ALT, ALKPHOS, BILITOT, BILIDIR, IBILI in the last 72 hours. PT/INR No results for input(s): LABPROT, INR in the last 72 hours.       Assessment / Plan:    #1  75yo female with UC vs Crohns colitis  With severe rectal involvement , multiple deep ulcers . Questionable response to initial dose of remicade 536mkg- continues w pain refractory to oral analgesics- had to go back to  Dilaudid, and continued tenesmus and frequency/incontinence Continues on Solumedrol 30 q12   Plan; CRP pending - had come down after initial dose   Will give second dose Remicade today 5 mg /kg  Continue Solumedrol 30 q12  Continue bentyl 10 Qid If  no improvement this week will need repeat flex and /or imaging  Active Problems:   Ulcerative pancolitis with rectal bleeding (HCC)   Abdominal pain   GI bleed     LOS: 5 days   [name]  01/13/2016, 10:20 AM

## 2016-01-13 NOTE — Progress Notes (Signed)
PROGRESS NOTE    Sara Barnes  VXY:[id number] DOB: [birth year] DOA: 01/07/2016 PCP: Ann Held, DO   Chief Complaint  Patient presents with  . Rectal Bleeding    SINCE FRIDAY    Brief Narrative:  HPI on 01/07/2016 by Dr. Marzetta Board Sara Barnes is a 75 y.o. femalewith medical history significant of ulcerative colitis versus Crohn's disease, believed to be UC eventually, otherwise healthy, presents to the emergency room with chief complaint of severe abdominal pain for the past several days. She was recently hospitalized and discharged home about 9 days prior to admission. She was here in the hospital with similar complaints, placed on steroids, and tells me today that she improved some and really wanted to go home. At home, she has had ongoing symptoms, never really well achieved full remission, and over the last few days she has felt extremely unwell. She has crampy abdominal pain, nausea, poor appetite, barely able to keep down some Gatorade, and has been having significant amounts of bloody bowel movements as well. She has no fever or chills. She has no chest pain or shortness of breath. She denies any palpitations. She has no lightheadedness or dizziness. She feels generalized weakness. In the emergency room her vital signs are stable, her blood work is essentially unremarkable. Gastroenterology was consulted by EDP and have evaluated patient, and TRH was asked for admission  Gastroenterology consultation appreciated. Patient underwent flexible sigmoidoscopy. CMV was negative and she was started on Remicade. She was initially doing better and tolerating some diet, GI following and since still having a lot of pain and frequent BMs will be dosed with another Remicaide infusion today 9/17. If still not improving she may need surgical consultation. Minimal nausea, has been ambulating.  Assessment & Plan   Abdominal pain secondary to Ulcerative colitis flare with rectal  bleeding -Gastroenterology following -Hemoglobin currently stable -s/p flexible sigmoidoscopy on 01/08/16, biopsy Showed active erosive ulcer. No CMV found. Patient was started on Remicade on 01/09/2016 and is improving but very slowly - GI plans Remicade again today -Continue pain control (added PO Norco back today 9/17), IVF, solumedrol  Diarrhea secondary to UC flare -Cdiff negative -Continue supportive care   Depression/Anxiety -Continue prozac  DVT Prophylaxis  Lovenox  Code Status: Full  Family Communication: none at bedside  Disposition Plan: Admitted. Continue current treatment. Disposition ultimately will be guided by gastroenterology. Possibly home in next 24-48 hours depending on improvement.  Consultants Gastroenterology   Procedures  None  Antibiotics   Anti-infectives    None      Subjective:   Sara Barnes seen and examined today.  Having a lot of rectal pain, multiple bloody BMs since yesterday. Overall she is better, but pain is still quite bad, currently rates rectal pain at 8/10.  Objective:   Vitals:   01/12/16 1500 01/12/16 2239 01/13/16 0555 01/13/16 0830  BP: 128/76 129/65 116/71 (!) 108/58  Pulse: 67 66 65 84  Resp: 18 18 18 16   Temp: 98.4 F (36.9 C) 97.7 F (36.5 C) 98 F (36.7 C) 98.4 F (36.9 C)  TempSrc: Oral Oral Oral Oral  SpO2: 96% 98% 98% 94%  Weight:      Height:        Intake/Output Summary (Last 24 hours) at 01/13/16 1126 Last data filed at 01/13/16 0821  Gross per 24 hour  Intake              465 ml  Output  0 ml  Net              465 ml   Filed Weights   01/07/16 1136  Weight: 72.6 kg (160 lb)    Exam  General: Well developed, well nourished, NAD  HEENT: NCAT, mucous membranes moist.   Cardiovascular: S1 S2 auscultated, RRR, no murmurs  Respiratory: Clear to auscultation bilaterally with equal chest rise  Abdomen: Soft, mildly tender, nondistended, + bowel sounds  Extremities: warm dry  without cyanosis clubbing or edema  Neuro: AAOx3, nonfocal  Psych: Appropriate mood and affect, Pleasant   Data Reviewed: I have personally reviewed following labs and imaging studies  CBC:  Recent Labs Lab 01/07/16 1358 01/08/16 0603 01/09/16 0622 01/11/16 0554 01/12/16 0913  WBC 8.1 5.5 5.8 9.6 6.4  NEUTROABS 5.2  --   --   --   --   HGB 14.5 12.8 12.6 13.3 12.2  HCT 43.9 39.8 38.5 39.7 37.5  MCV 93.6 94.8 94.6 91.9 94.2  PLT 419* 349 328 333 300   Basic Metabolic Panel:  Recent Labs Lab 01/07/16 1358 01/08/16 0603 01/09/16 0622 01/11/16 0554  NA 139 137 137 137  K 3.5 3.8 3.6 3.9  CL 103 103 100* 98*  CO2 26 26 29 30   GLUCOSE 104* 107* 124* 99  BUN 9 6 9 10   CREATININE 0.74 0.62 0.60 0.65  CALCIUM 8.6* 8.2* 8.4* 8.7*   GFR: Estimated Creatinine Clearance: 62.9 mL/min (by C-G formula based on SCr of 0.65 mg/dL). Liver Function Tests:  Recent Labs Lab 01/08/16 0603  AST 11*  ALT 15  ALKPHOS 73  BILITOT 0.4  PROT 5.7*  ALBUMIN 2.4*   No results for input(s): LIPASE, AMYLASE in the last 168 hours. No results for input(s): AMMONIA in the last 168 hours. Coagulation Profile: No results for input(s): INR, PROTIME in the last 168 hours. Cardiac Enzymes: No results for input(s): CKTOTAL, CKMB, CKMBINDEX, TROPONINI in the last 168 hours. BNP (last 3 results) No results for input(s): PROBNP in the last 8760 hours. HbA1C: No results for input(s): HGBA1C in the last 72 hours. CBG: No results for input(s): GLUCAP in the last 168 hours. Lipid Profile: No results for input(s): CHOL, HDL, LDLCALC, TRIG, CHOLHDL, LDLDIRECT in the last 72 hours. Thyroid Function Tests: No results for input(s): TSH, T4TOTAL, FREET4, T3FREE, THYROIDAB in the last 72 hours. Anemia Panel: No results for input(s): VITAMINB12, FOLATE, FERRITIN, TIBC, IRON, RETICCTPCT in the last 72 hours. Urine analysis:    Component Value Date/Time   COLORURINE YELLOW 04/19/2015 0838    APPEARANCEUR CLOUDY (A) 04/19/2015 0838   LABSPEC 1.013 04/19/2015 0838   PHURINE 6.0 04/19/2015 0838   GLUCOSEU NEGATIVE 04/19/2015 0838   HGBUR MODERATE (A) 04/19/2015 0838   HGBUR small 12/13/2009 0904   BILIRUBINUR NEGATIVE 04/19/2015 0838   BILIRUBINUR ne 01/30/2015 1543   KETONESUR 40 (A) 04/19/2015 0838   PROTEINUR NEGATIVE 04/19/2015 0838   UROBILINOGEN 0.2 01/30/2015 1543   UROBILINOGEN 0.2 09/25/2013 1350   NITRITE NEGATIVE 04/19/2015 0838   LEUKOCYTESUR TRACE (A) 04/19/2015 0838   Sepsis Labs: @LABRCNTIP (procalcitonin:4,lacticidven:4)  ) Recent Results (from the past 240 hour(s))  C difficile quick scan w PCR reflex     Status: None   Collection Time: 01/07/16  3:05 PM  Result Value Ref Range Status   C Diff antigen NEGATIVE NEGATIVE Final   C Diff toxin NEGATIVE NEGATIVE Final   C Diff interpretation No C. difficile detected.  Final  Radiology Studies: No results found.   Scheduled Meds: . chlorhexidine  15 mL Mouth/Throat BID  . dicyclomine  10 mg Oral TID AC & HS  . enoxaparin (LOVENOX) injection  40 mg Subcutaneous Q24H  . FLUoxetine  20 mg Oral Daily  . inFLIXimab (REMICADE) infusion  5 mg/kg Intravenous Once  . methylPREDNISolone (SOLU-MEDROL) injection  30 mg Intravenous Q12H   Continuous Infusions:    LOS: 5 days   Time Spent in minutes   24 minutes  Mir Marry Guan MD on 01/13/2016 at 11:26 AM  Between 7am to 7pm - Pager - [phone]  After 7pm go to [email or web address] - password [email or web address]  And look for the night coverage person covering for me after hours  [facility] Office  [phone]

## 2016-01-14 DIAGNOSIS — K529 Noninfective gastroenteritis and colitis, unspecified: Secondary | ICD-10-CM

## 2016-01-14 DIAGNOSIS — K6289 Other specified diseases of anus and rectum: Secondary | ICD-10-CM

## 2016-01-14 LAB — C-REACTIVE PROTEIN: CRP: 3.1 mg/dL — AB (ref <1.0)

## 2016-01-14 MED ORDER — BOOST / RESOURCE BREEZE PO LIQD
1.0000 | Freq: Three times a day (TID) | ORAL | Status: DC
  Administered 2016-01-14: 14:00:00 via ORAL
  Administered 2016-01-14: 1 via ORAL

## 2016-01-14 NOTE — Progress Notes (Signed)
PROGRESS NOTE    Sara Barnes  XMI:[id number] DOB: 1940/10/05 DOA: 01/07/2016 PCP: Ann Held, DO   Chief Complaint  Patient presents with  . Rectal Bleeding    SINCE FRIDAY    Brief Narrative:  HPI on 01/07/2016 by Dr. Marzetta Board Sara Barnes is a 75 y.o. femalewith medical history significant of ulcerative colitis versus Crohn's disease, believed to be UC eventually, otherwise healthy, presents to the emergency room with chief complaint of severe abdominal pain for the past several days. She was recently hospitalized and discharged home about 9 days prior to admission. She was here in the hospital with similar complaints, placed on steroids, and tells me today that she improved some and really wanted to go home. At home, she has had ongoing symptoms, never really well achieved full remission, and over the last few days she has felt extremely unwell. She has crampy abdominal pain, nausea, poor appetite, barely able to keep down some Gatorade, and has been having significant amounts of bloody bowel movements as well. She has no fever or chills. She has no chest pain or shortness of breath. She denies any palpitations. She has no lightheadedness or dizziness. She feels generalized weakness. In the emergency room her vital signs are stable, her blood work is essentially unremarkable. Gastroenterology was consulted by EDP and have evaluated patient, and TRH was asked for admission  Gastroenterology consultation appreciated. Patient underwent flexible sigmoidoscopy. CMV was negative and she was started on Remicade. She was initially doing better and tolerating some diet, GI following and since still having a lot of pain and frequent BMs will be dosed with another Remicaide infusion today 9/17. She reports persistent diarrhea today. If she doesn't improve by am, will further evaluatie  Assessment & Plan   Abdominal pain secondary to Ulcerative colitis flare with rectal  bleeding -Gastroenterology following -Hemoglobin currently stable -s/p flexible sigmoidoscopy on 01/08/16, biopsy Showed active erosive ulcer. No CMV found. Patient was started on Remicade on 01/09/2016 and is improving but very slowly. Reports having 7 bowel movements.  - GI plans Remicade again today -Continue pain control (added PO Norco back today 9/17), IVF, solumedrol  Diarrhea secondary to UC flare -Cdiff negative -Continue supportive care   Depression/Anxiety -Continue prozac  DVT Prophylaxis  Lovenox  Code Status: Full  Family Communication: none at bedside  Disposition Plan: possibly 2 to 3 days.  Consultants Gastroenterology   Procedures  None  Antibiotics   Anti-infectives    None      Subjective:   Sara Barnes seen and examined today.  Having a lot of rectal pain, multiple bloody BMs since yesterday. Overall she is better, but pain is still quite bad, currently rates rectal pain at 8/10.  Objective:   Vitals:   01/13/16 1423 01/13/16 2118 01/14/16 0528 01/14/16 1523  BP: 110/62 121/67 104/67 (!) 109/59  Pulse: 65 62 65 66  Resp: 18 18 18 20   Temp: 98 F (36.7 C) 98 F (36.7 C) 97.7 F (36.5 C) 98.5 F (36.9 C)  TempSrc: Oral Oral Oral Oral  SpO2: 98% 97% 97% 98%  Weight:      Height:        Intake/Output Summary (Last 24 hours) at 01/14/16 1723 Last data filed at 01/13/16 2216  Gross per 24 hour  Intake              100 ml  Output  0 ml  Net              100 ml   Filed Weights   01/07/16 1136  Weight: 72.6 kg (160 lb)    Exam  General: Well developed, well nourished, NAD  HEENT: NCAT, mucous membranes moist.   Cardiovascular: S1 S2 auscultated, RRR, no murmurs  Respiratory: Clear to auscultation bilaterally with equal chest rise  Abdomen: Soft, mildly tender, nondistended, + bowel sounds  Extremities: warm dry without cyanosis clubbing or edema  Neuro: AAOx3, nonfocal  Psych: Appropriate mood and affect,  Pleasant   Data Reviewed: I have personally reviewed following labs and imaging studies  CBC:  Recent Labs Lab 01/08/16 0603 01/09/16 0622 01/11/16 0554 01/12/16 0913  WBC 5.5 5.8 9.6 6.4  HGB 12.8 12.6 13.3 12.2  HCT 39.8 38.5 39.7 37.5  MCV 94.8 94.6 91.9 94.2  PLT 349 328 333 338   Basic Metabolic Panel:  Recent Labs Lab 01/08/16 0603 01/09/16 0622 01/11/16 0554  NA 137 137 137  K 3.8 3.6 3.9  CL 103 100* 98*  CO2 26 29 30   GLUCOSE 107* 124* 99  BUN 6 9 10   CREATININE 0.62 0.60 0.65  CALCIUM 8.2* 8.4* 8.7*   GFR: Estimated Creatinine Clearance: 62.9 mL/min (by C-G formula based on SCr of 0.65 mg/dL). Liver Function Tests:  Recent Labs Lab 01/08/16 0603  AST 11*  ALT 15  ALKPHOS 73  BILITOT 0.4  PROT 5.7*  ALBUMIN 2.4*   No results for input(s): LIPASE, AMYLASE in the last 168 hours. No results for input(s): AMMONIA in the last 168 hours. Coagulation Profile: No results for input(s): INR, PROTIME in the last 168 hours. Cardiac Enzymes: No results for input(s): CKTOTAL, CKMB, CKMBINDEX, TROPONINI in the last 168 hours. BNP (last 3 results) No results for input(s): PROBNP in the last 8760 hours. HbA1C: No results for input(s): HGBA1C in the last 72 hours. CBG: No results for input(s): GLUCAP in the last 168 hours. Lipid Profile: No results for input(s): CHOL, HDL, LDLCALC, TRIG, CHOLHDL, LDLDIRECT in the last 72 hours. Thyroid Function Tests: No results for input(s): TSH, T4TOTAL, FREET4, T3FREE, THYROIDAB in the last 72 hours. Anemia Panel: No results for input(s): VITAMINB12, FOLATE, FERRITIN, TIBC, IRON, RETICCTPCT in the last 72 hours. Urine analysis:    Component Value Date/Time   COLORURINE YELLOW 04/19/2015 0838   APPEARANCEUR CLOUDY (A) 04/19/2015 0838   LABSPEC 1.013 04/19/2015 0838   PHURINE 6.0 04/19/2015 0838   GLUCOSEU NEGATIVE 04/19/2015 0838   HGBUR MODERATE (A) 04/19/2015 0838   HGBUR small 12/13/2009 0904   BILIRUBINUR  NEGATIVE 04/19/2015 0838   BILIRUBINUR ne 01/30/2015 1543   KETONESUR 40 (A) 04/19/2015 0838   PROTEINUR NEGATIVE 04/19/2015 0838   UROBILINOGEN 0.2 01/30/2015 1543   UROBILINOGEN 0.2 09/25/2013 1350   NITRITE NEGATIVE 04/19/2015 0838   LEUKOCYTESUR TRACE (A) 04/19/2015 0838   Sepsis Labs: @LABRCNTIP (procalcitonin:4,lacticidven:4)  ) Recent Results (from the past 240 hour(s))  C difficile quick scan w PCR reflex     Status: None   Collection Time: 01/07/16  3:05 PM  Result Value Ref Range Status   C Diff antigen NEGATIVE NEGATIVE Final   C Diff toxin NEGATIVE NEGATIVE Final   C Diff interpretation No C. difficile detected.  Final      Radiology Studies: No results found.   Scheduled Meds: . chlorhexidine  15 mL Mouth/Throat BID  . dicyclomine  10 mg Oral TID AC & HS  . enoxaparin (  LOVENOX) injection  40 mg Subcutaneous Q24H  . feeding supplement  1 Container Oral TID BM  . FLUoxetine  20 mg Oral Daily  . methylPREDNISolone (SOLU-MEDROL) injection  30 mg Intravenous Q12H   Continuous Infusions:    LOS: 6 days   Time Spent in minutes   24 minutes  Assyria Morreale MD on 01/14/2016 at 5:23 PM  Between 7am to 7pm - Pager - [phone]  After 7pm go to [email or web address] - password [email or web address]  And look for the night coverage person covering for me after hours  [facility] Office  [phone]

## 2016-01-14 NOTE — Care Management Important Message (Signed)
Important Message  Patient Details  Name: Sara Barnes MRN: [id number] Date of Birth: 1941/01/29   Medicare Important Message Given:  Yes    [name] 01/14/2016, 10:46 AMImportant Message  Patient Details  Name: Sara Barnes MRN: [id number] Date of Birth: Jun 24, 1940   Medicare Important Message Given:  Yes    [name] 01/14/2016, 10:46 AM

## 2016-01-14 NOTE — Progress Notes (Signed)
     [facility] Progress Note  Subjective:  Pain is manageable with current regimen of Dilaudid and Norco.  In better place mentally with pain controlled.  Still with incontinence, rectal spasm, and tenesmus but did have a little form to a stool since Remicade yesterday.  Objective:  Vital signs in last 24 hours: Temp:  [97.7 F (36.5 C)-98 F (36.7 C)] 97.7 F (36.5 C) (09/18 0528) Pulse Rate:  [62-65] 65 (09/18 0528) Resp:  [18] 18 (09/18 0528) BP: (104-121)/(62-67) 104/67 (09/18 0528) SpO2:  [97 %-98 %] 97 % (09/18 0528) Last BM Date: 01/13/16 General:  Alert, Well-developed, in NAD Heart:  Regular rate and rhythm; no murmurs Pulm:  CTAB.  No W/R/R. Abdomen:  Soft, non-distended. BS present.  Mild LLQ and suprapubic TTP. Extremities:  Without edema. Neurologic:  Alert and oriented x 4;  grossly normal neurologically. Psych:  Alert and cooperative. Normal mood and affect.  Intake/Output from previous day: 09/17 0701 - 09/18 0700 In: 200 [P.O.:200] Out: -   Lab Results:  Recent Labs  01/12/16 0913  WBC 6.4  HGB 12.2  HCT 37.5  PLT 295   Assessment / Plan: #1 75 yo female with UC vs Crohns colitis with severe rectal involvement, multiple deep ulcers.  Questionable response to initial dose of remicade 49m/kg- continued with pain refractory to oral analgesics- had to go back to some Dilaudid, and continued tenesmus and frequency/incontinence.  received another dose of remicade 576mkg on 9/18.  Continues on Solumedrol 30 q12 as well.  CRP 3.1 today which is the same as yesterday.  Continue bentyl 10 QID.  No other changes for now.  If no further improvement this week will likely need repeat flex and/or imaging.    LOS: 6 days   ZEHR, JESSICA D.  01/14/2016, 9:07 AM  Pager number [phone] I have discussed the case with the PA, and that is the plan I formulated. I personally interviewed and examined the patient.  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  She is modestly improved and just needs some more time on current treatment regimen.  Reassured.  No need for further CRP checks while inpatient.  Home soon.  Will see tomorrow.  HeNelida MeuseII Pager [phone]-Fri 8a-5p [phone] 5p, weekends, holidays

## 2016-01-15 DIAGNOSIS — R197 Diarrhea, unspecified: Secondary | ICD-10-CM

## 2016-01-15 NOTE — Progress Notes (Signed)
PROGRESS NOTE    Sara Barnes  JGG:[id number] DOB: 02/22/1941 DOA: 01/07/2016 PCP: Ann Held, DO   Chief Complaint  Patient presents with  . Rectal Bleeding    SINCE FRIDAY    Brief Narrative:  HPI on 01/07/2016 by Dr. Marzetta Board Sara Barnes is a 75 y.o. femalewith medical history significant of ulcerative colitis versus Crohn's disease, believed to be UC eventually, otherwise healthy, presents to the emergency room with chief complaint of severe abdominal pain for the past several days. She was recently hospitalized and discharged home about 9 days prior to admission. She was here in the hospital with similar complaints, placed on steroids, and tells me today that she improved some and really wanted to go home. At home, she has had ongoing symptoms, never really well achieved full remission, and over the last few days she has felt extremely unwell. She has crampy abdominal pain, nausea, poor appetite, barely able to keep down some Gatorade, and has been having significant amounts of bloody bowel movements as well. She has no fever or chills. She has no chest pain or shortness of breath. She denies any palpitations. She has no lightheadedness or dizziness. She feels generalized weakness. In the emergency room her vital signs are stable, her blood work is essentially unremarkable. Gastroenterology was consulted by EDP and have evaluated patient, and TRH was asked for admission  Gastroenterology consultation appreciated. Patient underwent flexible sigmoidoscopy. CMV was negative and she was started on Remicade. She was initially doing better and tolerating some diet, GI following and since still having a lot of pain and frequent BMs will be dosed with another Remicaide infusion today 9/17. She reports her cramping is slightly better today, but the diarrhea frequency is the same,hope ful she will be better by tomorrow.    Assessment & Plan   Abdominal pain secondary to Ulcerative  colitis flare with rectal bleeding -Gastroenterology following -Hemoglobin currently stable -s/p flexible sigmoidoscopy on 01/08/16, biopsy Showed active erosive ulcer. No CMV found. Patient was started on Remicade on 01/09/2016 and 9/17.  and is improving but very slowly. Reports the cramping is slightly better but diarrhea persistent Resume pain control and IV fluids and steroids.   Diarrhea secondary to UC flare -Cdiff negative -Continue supportive care   Depression/Anxiety -Continue prozac  DVT Prophylaxis  Lovenox  Code Status: Full  Family Communication: none at bedside  Disposition Plan: possibly 2 to 3 days.  Consultants Gastroenterology   Procedures  None  Antibiotics   Anti-infectives    None      Subjective:   Sara Barnes seen and examined today.  Cramping pain has improved.  Diarrhea persistent, no nausea or vomiting.   Objective:   Vitals:   01/14/16 1523 01/14/16 2108 01/15/16 0415 01/15/16 1004  BP: (!) 109/59 117/77 (!) 143/72 133/69  Pulse: 66 65 63 72  Resp: 20 18 18 16   Temp: 98.5 F (36.9 C) 97.8 F (36.6 C) 97.8 F (36.6 C) 97.5 F (36.4 C)  TempSrc: Oral Oral Oral Oral  SpO2: 98% 97% 98% 97%  Weight:      Height:        Intake/Output Summary (Last 24 hours) at 01/15/16 1443 Last data filed at 01/15/16 1300  Gross per 24 hour  Intake              450 ml  Output                0 ml  Net              450 ml   Filed Weights   01/07/16 1136  Weight: 72.6 kg (160 lb)    Exam  General: Well developed, well nourished, NAD  HEENT: NCAT, mucous membranes moist.   Cardiovascular: S1 S2 auscultated, RRR, no murmurs  Respiratory: Clear to auscultation bilaterally with equal chest rise  Abdomen: Soft, mildly tender, nondistended, + bowel sounds  Extremities: warm dry without cyanosis clubbing or edema  Neuro: AAOx3, nonfocal  Psych: Appropriate mood and affect, Pleasant   Data Reviewed: I have personally reviewed following  labs and imaging studies  CBC:  Recent Labs Lab 01/09/16 0622 01/11/16 0554 01/12/16 0913  WBC 5.8 9.6 6.4  HGB 12.6 13.3 12.2  HCT 38.5 39.7 37.5  MCV 94.6 91.9 94.2  PLT 328 333 415   Basic Metabolic Panel:  Recent Labs Lab 01/09/16 0622 01/11/16 0554  NA 137 137  K 3.6 3.9  CL 100* 98*  CO2 29 30  GLUCOSE 124* 99  BUN 9 10  CREATININE 0.60 0.65  CALCIUM 8.4* 8.7*   GFR: Estimated Creatinine Clearance: 62.9 mL/min (by C-G formula based on SCr of 0.65 mg/dL). Liver Function Tests: No results for input(s): AST, ALT, ALKPHOS, BILITOT, PROT, ALBUMIN in the last 168 hours. No results for input(s): LIPASE, AMYLASE in the last 168 hours. No results for input(s): AMMONIA in the last 168 hours. Coagulation Profile: No results for input(s): INR, PROTIME in the last 168 hours. Cardiac Enzymes: No results for input(s): CKTOTAL, CKMB, CKMBINDEX, TROPONINI in the last 168 hours. BNP (last 3 results) No results for input(s): PROBNP in the last 8760 hours. HbA1C: No results for input(s): HGBA1C in the last 72 hours. CBG: No results for input(s): GLUCAP in the last 168 hours. Lipid Profile: No results for input(s): CHOL, HDL, LDLCALC, TRIG, CHOLHDL, LDLDIRECT in the last 72 hours. Thyroid Function Tests: No results for input(s): TSH, T4TOTAL, FREET4, T3FREE, THYROIDAB in the last 72 hours. Anemia Panel: No results for input(s): VITAMINB12, FOLATE, FERRITIN, TIBC, IRON, RETICCTPCT in the last 72 hours. Urine analysis:    Component Value Date/Time   COLORURINE YELLOW 04/19/2015 0838   APPEARANCEUR CLOUDY (A) 04/19/2015 0838   LABSPEC 1.013 04/19/2015 0838   PHURINE 6.0 04/19/2015 0838   GLUCOSEU NEGATIVE 04/19/2015 0838   HGBUR MODERATE (A) 04/19/2015 0838   HGBUR small 12/13/2009 0904   BILIRUBINUR NEGATIVE 04/19/2015 0838   BILIRUBINUR ne 01/30/2015 1543   KETONESUR 40 (A) 04/19/2015 0838   PROTEINUR NEGATIVE 04/19/2015 0838   UROBILINOGEN 0.2 01/30/2015 1543    UROBILINOGEN 0.2 09/25/2013 1350   NITRITE NEGATIVE 04/19/2015 0838   LEUKOCYTESUR TRACE (A) 04/19/2015 0838   Sepsis Labs: @LABRCNTIP (procalcitonin:4,lacticidven:4)  ) Recent Results (from the past 240 hour(s))  C difficile quick scan w PCR reflex     Status: None   Collection Time: 01/07/16  3:05 PM  Result Value Ref Range Status   C Diff antigen NEGATIVE NEGATIVE Final   C Diff toxin NEGATIVE NEGATIVE Final   C Diff interpretation No C. difficile detected.  Final      Radiology Studies: No results found.   Scheduled Meds: . chlorhexidine  15 mL Mouth/Throat BID  . dicyclomine  10 mg Oral TID AC & HS  . enoxaparin (LOVENOX) injection  40 mg Subcutaneous Q24H  . feeding supplement  1 Container Oral TID BM  . FLUoxetine  20 mg Oral Daily  . methylPREDNISolone (SOLU-MEDROL) injection  30 mg Intravenous Q12H   Continuous Infusions:    LOS: 7 days   Time Spent in minutes   20 minutes  Faraz Ponciano MD on 01/15/2016 at 2:43 PM  Between 7am to 7pm - Pager - [phone]  After 7pm go to [email or web address] - password [email or web address]  And look for the night coverage person covering for me after hours  [facility] Office  [phone]

## 2016-01-15 NOTE — Progress Notes (Signed)
    Progress Note   Subjective  Chief Complaint: Ulcerative Colitis with severe rectal involvement  Pt reports that she continue to improve, noting that since last night she has had 3 episodes of loose stool for which she was able to make it to the bathroom "without leaving a trail", which has made her feel like things are continuing to improve. She tells me that a big component of things is her acceptance that she can "no longer do this alone", and that she does need medicine, including pain meds to keep things under control. Mentally/emoptionally she also admits to doing some better.    Objective   Vital signs in last 24 hours: Temp:  [97.5 F (36.4 C)-98.5 F (36.9 C)] 97.5 F (36.4 C) (09/19 1004) Pulse Rate:  [63-72] 72 (09/19 1004) Resp:  [16-20] 16 (09/19 1004) BP: (109-143)/(59-77) 133/69 (09/19 1004) SpO2:  [97 %-98 %] 97 % (09/19 1004) Last BM Date: 01/15/16 General: Alert,NAD Heart:  Regular rate and rhythm; no murmurs Lungs: Respirations even and unlabored, lungs CTA bilaterally Abdomen:  Soft, mild LLQ and suprapubic ttp and nondistended. Normal bowel sounds. Extremities:  Without edema. Neurologic:  Alert and oriented,  grossly normal neurologically. Psych:  Cooperative. Normal mood and affect.  Intake/Output from previous day: No intake/output data recorded. Intake/Output this shift: No intake/output data recorded.  Lab Results: No results for input(s): WBC, HGB, HCT, PLT in the last 72 hours. BMET No results for input(s): NA, K, CL, CO2, GLUCOSE, BUN, CREATININE, CALCIUM in the last 72 hours. LFT No results for input(s): PROT, ALBUMIN, AST, ALT, ALKPHOS, BILITOT, BILIDIR, IBILI in the last 72 hours. PT/INR No results for input(s): LABPROT, INR in the last 72 hours.  Studies/Results: No results found.     Assessment / Plan:    Assessment / Plan: #1 75 yo female with UC vs Crohns colitis with severe rectal involvement, multiple deep ulcers.   Questionable response to initial dose of remicade 33m/kg- continued with pain refractory to oral analgesics- had to go back to some Dilaudid, and continued with tenesmus and frequency/incontinence.  Received another dose of remicade 555mkg on 9/18.  Continues on Solumedrol 30 q12 as well.  CRP 3.1 01/14/16 which is the same as two days ago.  Continue bentyl 10 QID.   If no further improvement this week will likely need repeat flex and/or imaging.    We will continue to follow. Please await any further recs from Dr. DaLoletha Carrow  LOS: 7 days   JeLevin Erp9/19/2017, 12:01 PM  Pager # [phone]

## 2016-01-16 DIAGNOSIS — K922 Gastrointestinal hemorrhage, unspecified: Secondary | ICD-10-CM

## 2016-01-16 NOTE — Progress Notes (Signed)
    Progress Note   Subjective  Chief Complaint: Ulcerative colitis with severe rectal involvement  Patient reports that she continues to slowly improve, though she did continue to require 2 doses of IV Dilaudid yesterday and overnight, as of this morning she has only had 2-3 bowel movements and has been "able to make it to the bathroom", she believes she is improving, but is not quite ready to go home.    Objective   Vital signs in last 24 hours: Temp:  [97.7 F (36.5 C)-98.3 F (36.8 C)] 97.7 F (36.5 C) (09/20 0541) Pulse Rate:  [58-66] 66 (09/20 0541) Resp:  [16] 16 (09/20 0541) BP: (134-149)/(69-82) 149/70 (09/20 0541) SpO2:  [98 %-100 %] 100 % (09/20 0541) Last BM Date: 01/16/16 General: Alert,NAD Heart:  Regular rate and rhythm; no murmurs Lungs: Respirations even and unlabored, lungs CTA bilaterally Abdomen:  Soft, mild LLQ and suprapubic ttp and nondistended. Normal bowel sounds. Extremities:  Without edema. Neurologic:  Alert and oriented,  grossly normal neurologically. Psych:  Cooperative. Normal mood and affect.  Intake/Output from previous day: 09/19 0701 - 09/20 0700 In: 31 [P.O.:1110] Out: -   Lab Results: No recent lab results    Assessment / Plan:    Assessment / Plan: #1 75 yo female with UC vs Crohns colitis with severe rectal involvement, multiple deep ulcers. Questionable response to initial dose of remicade 18m/kg- continuedwithpain refractory to oral analgesics- had to go back to some Dilaudid, and continued with tenesmus and frequency/incontinence. Received another dose of remicade 529mkg on 9/18. Continues on Solumedrol 30 q12 as well. CRP 3.1 01/14/16 which is the same as two days ago. Continue bentyl 10 QID.  If no furtherimprovement this week will likely need repeat flex and/or imaging.   Criteria for discharge: 1. Ability to maintain oral nutrition, 2. Frequency of bowel movements under reasonable control, 3. Pain under reasonable  control with oral meds  We will continue to follow. Please await any further recs from Dr. JaArdis Hughs LOS: 8 days   JeLavone NianePam [facility]9/20/2017, 10:06 AM  Pager # [phone] ________________________________________________________________________  LeVelora HecklerI MD note:  I personally examined the patient, reviewed the data and agree with the assessment and plan described above.  She is overall better than 7-10 days ago, just got remicade infusion #2 over the weekend, CRP is also down about 16 points in a week and so I do think that she is heading in the right direction but clearly not ready for d/c yet.  No change in management for now.   DaOwens LofflerMD [facility] Pager [phone]

## 2016-01-16 NOTE — Progress Notes (Signed)
PROGRESS NOTE    Sara Barnes  SWH:[id number] DOB: 07/28/1940 DOA: 01/07/2016 PCP: Ann Held, DO   Chief Complaint  Patient presents with  . Rectal Bleeding    SINCE FRIDAY    Brief Narrative:  HPI on 01/07/2016 by Dr. Marzetta Board Sara Barnes is a 75 y.o. femalewith medical history significant of ulcerative colitis versus Crohn's disease, believed to be UC eventually, otherwise healthy, presents to the emergency room with chief complaint of severe abdominal pain for the past several days. She was recently hospitalized and discharged home about 9 days prior to admission. She was here in the hospital with similar complaints, placed on steroids, and tells me today that she improved some and really wanted to go home. At home, she has had ongoing symptoms, never really well achieved full remission, and over the last few days she has felt extremely unwell. She has crampy abdominal pain, nausea, poor appetite, barely able to keep down some Gatorade, and has been having significant amounts of bloody bowel movements as well. She has no fever or chills. She has no chest pain or shortness of breath. She denies any palpitations. She has no lightheadedness or dizziness. She feels generalized weakness. In the emergency room her vital signs are stable, her blood work is essentially unremarkable. Gastroenterology was consulted by EDP and have evaluated patient, and TRH was asked for admission  Gastroenterology consultation appreciated. Patient underwent flexible sigmoidoscopy. CMV was negative and she was started on Remicade. She was initially doing better and tolerating some diet, GI following and since still having a lot of pain and frequent BMs will be dosed with another Remicaide infusion today 9/17. She reports her cramping is slightly better today, dirrhea slightly better, hope ful she will be better by tomorrow.    Assessment & Plan   Abdominal pain secondary to Ulcerative colitis flare  with rectal bleeding -Gastroenterology following -Hemoglobin currently stable -s/p flexible sigmoidoscopy on 01/08/16, biopsy Showed active erosive ulcer. No CMV found. Patient was started on Remicade on 01/09/2016 and 9/17.  and is improving but very slowly. Reports the cramping and diarrhea  slightly better when compared to the day before.  Resume pain control and IV fluids and steroids.  Plan for discharge in 2 days.   Diarrhea secondary to UC flare -Cdiff negative -Continue supportive care   Depression/Anxiety -Continue prozac  DVT Prophylaxis  Lovenox  Code Status: Full  Family Communication: none at bedside  Disposition Plan: possibly 2 to 3 days.  Consultants Gastroenterology   Procedures  None  Antibiotics   Anti-infectives    None      Subjective:   Sara Barnes seen and examined today.  Cramping pain has improved.  Diarrhea slightly improved,appetite is improving., no nausea or vomiting.   Objective:   Vitals:   01/15/16 2212 01/16/16 0240 01/16/16 0541 01/16/16 1351  BP: 136/73 134/82 (!) 149/70 120/67  Pulse: (!) 58 66 66 84  Resp: 16 16 16 18   Temp: 98.1 F (36.7 C) 97.8 F (36.6 C) 97.7 F (36.5 C) 97.8 F (36.6 C)  TempSrc: Oral Oral Oral Oral  SpO2: 100% 98% 100% 97%  Weight:      Height:        Intake/Output Summary (Last 24 hours) at 01/16/16 1719 Last data filed at 01/16/16 1351  Gross per 24 hour  Intake              900 ml  Output  0 ml  Net              900 ml   Filed Weights   01/07/16 1136  Weight: 72.6 kg (160 lb)    Exam  General: Well developed, well nourished, NAD  HEENT: NCAT, mucous membranes moist.   Cardiovascular: S1 S2 auscultated, RRR, no murmurs  Respiratory: Clear to auscultation bilaterally with equal chest rise  Abdomen: Soft, mildly tender, nondistended, + bowel sounds  Extremities: warm dry without cyanosis clubbing or edema  Neuro: AAOx3, nonfocal  Psych: Appropriate mood and  affect, Pleasant   Data Reviewed: I have personally reviewed following labs and imaging studies  CBC:  Recent Labs Lab 01/11/16 0554 01/12/16 0913  WBC 9.6 6.4  HGB 13.3 12.2  HCT 39.7 37.5  MCV 91.9 94.2  PLT 333 614   Basic Metabolic Panel:  Recent Labs Lab 01/11/16 0554  NA 137  K 3.9  CL 98*  CO2 30  GLUCOSE 99  BUN 10  CREATININE 0.65  CALCIUM 8.7*   GFR: Estimated Creatinine Clearance: 62.9 mL/min (by C-G formula based on SCr of 0.65 mg/dL). Liver Function Tests: No results for input(s): AST, ALT, ALKPHOS, BILITOT, PROT, ALBUMIN in the last 168 hours. No results for input(s): LIPASE, AMYLASE in the last 168 hours. No results for input(s): AMMONIA in the last 168 hours. Coagulation Profile: No results for input(s): INR, PROTIME in the last 168 hours. Cardiac Enzymes: No results for input(s): CKTOTAL, CKMB, CKMBINDEX, TROPONINI in the last 168 hours. BNP (last 3 results) No results for input(s): PROBNP in the last 8760 hours. HbA1C: No results for input(s): HGBA1C in the last 72 hours. CBG: No results for input(s): GLUCAP in the last 168 hours. Lipid Profile: No results for input(s): CHOL, HDL, LDLCALC, TRIG, CHOLHDL, LDLDIRECT in the last 72 hours. Thyroid Function Tests: No results for input(s): TSH, T4TOTAL, FREET4, T3FREE, THYROIDAB in the last 72 hours. Anemia Panel: No results for input(s): VITAMINB12, FOLATE, FERRITIN, TIBC, IRON, RETICCTPCT in the last 72 hours. Urine analysis:    Component Value Date/Time   COLORURINE YELLOW 04/19/2015 0838   APPEARANCEUR CLOUDY (A) 04/19/2015 0838   LABSPEC 1.013 04/19/2015 0838   PHURINE 6.0 04/19/2015 0838   GLUCOSEU NEGATIVE 04/19/2015 0838   HGBUR MODERATE (A) 04/19/2015 0838   HGBUR small 12/13/2009 0904   BILIRUBINUR NEGATIVE 04/19/2015 0838   BILIRUBINUR ne 01/30/2015 1543   KETONESUR 40 (A) 04/19/2015 0838   PROTEINUR NEGATIVE 04/19/2015 0838   UROBILINOGEN 0.2 01/30/2015 1543   UROBILINOGEN  0.2 09/25/2013 1350   NITRITE NEGATIVE 04/19/2015 0838   LEUKOCYTESUR TRACE (A) 04/19/2015 0838   Sepsis Labs: @LABRCNTIP (procalcitonin:4,lacticidven:4)  ) Recent Results (from the past 240 hour(s))  C difficile quick scan w PCR reflex     Status: None   Collection Time: 01/07/16  3:05 PM  Result Value Ref Range Status   C Diff antigen NEGATIVE NEGATIVE Final   C Diff toxin NEGATIVE NEGATIVE Final   C Diff interpretation No C. difficile detected.  Final      Radiology Studies: No results found.   Scheduled Meds: . chlorhexidine  15 mL Mouth/Throat BID  . dicyclomine  10 mg Oral TID AC & HS  . enoxaparin (LOVENOX) injection  40 mg Subcutaneous Q24H  . feeding supplement  1 Container Oral TID BM  . FLUoxetine  20 mg Oral Daily  . methylPREDNISolone (SOLU-MEDROL) injection  30 mg Intravenous Q12H   Continuous Infusions:    LOS: 8 days  Time Spent in minutes   20 minutes  Attilio Zeitler MD on 01/16/2016 at 5:19 PM  Between 7am to 7pm - Pager - [phone]  After 7pm go to [email or web address] - password [email or web address]  And look for the night coverage person covering for me after hours  [facility] Office  [phone]

## 2016-01-17 LAB — HEMOGLOBIN AND HEMATOCRIT, BLOOD
HCT: 34 % — ABNORMAL LOW (ref 36.0–46.0)
Hemoglobin: 11.1 g/dL — ABNORMAL LOW (ref 12.0–15.0)

## 2016-01-17 MED ORDER — HYDROMORPHONE HCL 1 MG/ML IJ SOLN
1.0000 mg | INTRAMUSCULAR | Status: DC | PRN
  Administered 2016-01-17 – 2016-02-06 (×125): 1 mg via INTRAVENOUS
  Filled 2016-01-17 (×129): qty 1

## 2016-01-17 MED ORDER — HYDROCORTISONE ACETATE 25 MG RE SUPP
25.0000 mg | Freq: Every day | RECTAL | Status: DC
  Administered 2016-01-17 – 2016-01-23 (×4): 25 mg via RECTAL
  Filled 2016-01-17 (×11): qty 1

## 2016-01-17 MED ORDER — VITAMINS A & D EX OINT
TOPICAL_OINTMENT | CUTANEOUS | Status: AC
  Administered 2016-01-17: 1
  Filled 2016-01-17: qty 5

## 2016-01-17 MED ORDER — HYDROCODONE-ACETAMINOPHEN 5-325 MG PO TABS
2.0000 | ORAL_TABLET | ORAL | Status: DC | PRN
  Administered 2016-01-17 – 2016-02-12 (×99): 2 via ORAL
  Filled 2016-01-17 (×112): qty 2

## 2016-01-17 NOTE — Progress Notes (Signed)
PROGRESS NOTE    Sara Barnes  GHW:[id number] DOB: 05/18/1940 DOA: 01/07/2016 PCP: Ann Held, DO   Chief Complaint  Patient presents with  . Rectal Bleeding    SINCE FRIDAY    Brief Narrative:  HPI on 01/07/2016 by Dr. Marzetta Board Sara Barnes is a 75 y.o. femalewith medical history significant of ulcerative colitis versus Crohn's disease, believed to be UC eventually, otherwise healthy, presents to the emergency room with chief complaint of severe abdominal pain for the past several days. She was recently hospitalized and discharged home about 9 days prior to admission. She was here in the hospital with similar complaints, placed on steroids, and tells me today that she improved some and really wanted to go home. At home, she has had ongoing symptoms, never really well achieved full remission, and over the last few days she has felt extremely unwell. She has crampy abdominal pain, nausea, poor appetite, barely able to keep down some Gatorade, and has been having significant amounts of bloody bowel movements as well. She has no fever or chills. She has no chest pain or shortness of breath. She denies any palpitations. She has no lightheadedness or dizziness. She feels generalized weakness. In the emergency room her vital signs are stable, her blood work is essentially unremarkable. Gastroenterology was consulted by EDP and have evaluated patient, and TRH was asked for admission  Gastroenterology consultation appreciated. Patient underwent flexible sigmoidoscopy. CMV was negative and she was started on Remicade. She was initially doing better and tolerating some diet, GI following and since still having a lot of pain and frequent BMs will be dosed with another Remicaide infusion on  9/17. 9/21 pain and bloody clots worsened last night.   Assessment & Plan   Abdominal pain secondary to Ulcerative colitis flare with rectal bleeding -Gastroenterology following -Hemoglobin sligtly  dropped to 11 form 12.  -s/p flexible sigmoidoscopy on 01/08/16, biopsy Showed active erosive ulcer. No CMV found. Patient was started on Remicade on 01/09/2016 and 9/17.  and is improving but very slowly. Pt reports worsening abdominal cramps and bloody clots from rectum. Increase the frequency of the pain meds and probably increase the IV soumedrol from 30 mg q 12 to 60 mg every 12 hours, , will discuss with gastroenterology.   Diarrhea secondary to UC flare -Cdiff negative -Continue supportive care - start gentle hydration.    Depression/Anxiety -Continue prozac  DVT Prophylaxis  Lovenox  Code Status: Full  Family Communication: none at bedside  Disposition Plan: possibly 2 to 3 days.  Consultants Gastroenterology   Procedures  None  Antibiotics   Anti-infectives    None      Subjective:   Sara Barnes seen and examined today. Worsening cramping and blood clots.   Objective:   Vitals:   01/16/16 0541 01/16/16 1351 01/16/16 2037 01/17/16 0539  BP: (!) 149/70 120/67 120/64 110/72  Pulse: 66 84 80 65  Resp: 16 18 18 18   Temp: 97.7 F (36.5 C) 97.8 F (36.6 C) 98.1 F (36.7 C) 97.5 F (36.4 C)  TempSrc: Oral Oral Oral Oral  SpO2: 100% 97% 97% 99%  Weight:      Height:        Intake/Output Summary (Last 24 hours) at 01/17/16 0924 Last data filed at 01/16/16 1351  Gross per 24 hour  Intake              240 ml  Output  0 ml  Net              240 ml   Filed Weights   01/07/16 1136  Weight: 72.6 kg (160 lb)    Exam  General: Well developed, well nourished, NAD  HEENT: NCAT, mucous membranes moist.   Cardiovascular: S1 S2 auscultated, RRR, no murmurs  Respiratory: Clear to auscultation bilaterally with equal chest rise  Abdomen: Soft, mildly tender, nondistended, + bowel sounds  Extremities: warm dry without cyanosis clubbing or edema  Neuro: AAOx3, nonfocal  Psych: Appropriate mood and affect, Pleasant   Data Reviewed: I have  personally reviewed following labs and imaging studies  CBC:  Recent Labs Lab 01/11/16 0554 01/12/16 0913 01/17/16 0828  WBC 9.6 6.4  --   HGB 13.3 12.2 11.1*  HCT 39.7 37.5 34.0*  MCV 91.9 94.2  --   PLT 333 295  --    Basic Metabolic Panel:  Recent Labs Lab 01/11/16 0554  NA 137  K 3.9  CL 98*  CO2 30  GLUCOSE 99  BUN 10  CREATININE 0.65  CALCIUM 8.7*   GFR: Estimated Creatinine Clearance: 62.9 mL/min (by C-G formula based on SCr of 0.65 mg/dL). Liver Function Tests: No results for input(s): AST, ALT, ALKPHOS, BILITOT, PROT, ALBUMIN in the last 168 hours. No results for input(s): LIPASE, AMYLASE in the last 168 hours. No results for input(s): AMMONIA in the last 168 hours. Coagulation Profile: No results for input(s): INR, PROTIME in the last 168 hours. Cardiac Enzymes: No results for input(s): CKTOTAL, CKMB, CKMBINDEX, TROPONINI in the last 168 hours. BNP (last 3 results) No results for input(s): PROBNP in the last 8760 hours. HbA1C: No results for input(s): HGBA1C in the last 72 hours. CBG: No results for input(s): GLUCAP in the last 168 hours. Lipid Profile: No results for input(s): CHOL, HDL, LDLCALC, TRIG, CHOLHDL, LDLDIRECT in the last 72 hours. Thyroid Function Tests: No results for input(s): TSH, T4TOTAL, FREET4, T3FREE, THYROIDAB in the last 72 hours. Anemia Panel: No results for input(s): VITAMINB12, FOLATE, FERRITIN, TIBC, IRON, RETICCTPCT in the last 72 hours. Urine analysis:    Component Value Date/Time   COLORURINE YELLOW 04/19/2015 0838   APPEARANCEUR CLOUDY (A) 04/19/2015 0838   LABSPEC 1.013 04/19/2015 0838   PHURINE 6.0 04/19/2015 0838   GLUCOSEU NEGATIVE 04/19/2015 0838   HGBUR MODERATE (A) 04/19/2015 0838   HGBUR small 12/13/2009 0904   BILIRUBINUR NEGATIVE 04/19/2015 0838   BILIRUBINUR ne 01/30/2015 1543   KETONESUR 40 (A) 04/19/2015 0838   PROTEINUR NEGATIVE 04/19/2015 0838   UROBILINOGEN 0.2 01/30/2015 1543   UROBILINOGEN 0.2  09/25/2013 1350   NITRITE NEGATIVE 04/19/2015 0838   LEUKOCYTESUR TRACE (A) 04/19/2015 0838   Sepsis Labs: @LABRCNTIP (procalcitonin:4,lacticidven:4)  ) Recent Results (from the past 240 hour(s))  C difficile quick scan w PCR reflex     Status: None   Collection Time: 01/07/16  3:05 PM  Result Value Ref Range Status   C Diff antigen NEGATIVE NEGATIVE Final   C Diff toxin NEGATIVE NEGATIVE Final   C Diff interpretation No C. difficile detected.  Final      Radiology Studies: No results found.   Scheduled Meds: . chlorhexidine  15 mL Mouth/Throat BID  . dicyclomine  10 mg Oral TID AC & HS  . enoxaparin (LOVENOX) injection  40 mg Subcutaneous Q24H  . feeding supplement  1 Container Oral TID BM  . FLUoxetine  20 mg Oral Daily  . methylPREDNISolone (SOLU-MEDROL) injection  30  mg Intravenous Q12H   Continuous Infusions:    LOS: 9 days   Time Spent in minutes   30 minutes  Bethany Cumming MD on 01/17/2016 at 9:24 AM  Between 7am to 7pm - Pager - [phone]  After 7pm go to [email or web address] - password [email or web address]  And look for the night coverage person covering for me after hours  [facility] Office  [phone]

## 2016-01-17 NOTE — Care Management Important Message (Signed)
Important Message  Patient Details  Name: Sara Barnes MRN: [id number] Date of Birth: [birth year]   Medicare Important Message Given:  Yes    [name] 01/17/2016, 10:13 AMImportant Message  Patient Details  Name: Sara Barnes MRN: [id number] Date of Birth: 11/07/1940   Medicare Important Message Given:  Yes    [name] 01/17/2016, 10:13 AM

## 2016-01-17 NOTE — Progress Notes (Signed)
[facility] Progress Note    Since last GI note: She thinks overall a bit more form to her stools and she's going less often. Still very bothered by ano/rectal pain and pressure, tenesmus and urgency.  Objective: Vital signs in last 24 hours: Temp:  [97.5 F (36.4 C)-98.1 F (36.7 C)] 97.5 F (36.4 C) (09/21 0539) Pulse Rate:  [65-84] 65 (09/21 0539) Resp:  [18] 18 (09/21 0539) BP: (110-120)/(64-72) 110/72 (09/21 0539) SpO2:  [97 %-99 %] 99 % (09/21 0539) Last BM Date: 01/17/16 General: alert and oriented times 3 Heart: regular rate and rythm Abdomen: soft, non-tender, non-distended, normal bowel sounds   Lab Results:  Recent Labs  01/17/16 0828  HGB 11.1*    Medications: Scheduled Meds: . chlorhexidine  15 mL Mouth/Throat BID  . dicyclomine  10 mg Oral TID AC & HS  . enoxaparin (LOVENOX) injection  40 mg Subcutaneous Q24H  . feeding supplement  1 Container Oral TID BM  . FLUoxetine  20 mg Oral Daily  . methylPREDNISolone (SOLU-MEDROL) injection  30 mg Intravenous Q12H   Continuous Infusions:  PRN Meds:.fluticasone, HYDROcodone-acetaminophen, HYDROmorphone (DILAUDID) injection, ondansetron, zolpidem    Assessment/Plan: 75 y.o. female with severe indeterminant colitis, severe rectal involvement.  She's on IV steroids, has had 2 infusions of remicade (second one was 4 days ago).   Very slowly improving, predominant symptoms (urgency, tenesmus, anal/rectal pain) are likely from the deep ulcers in her rectum.  Previously she did not tolerate suppositories (painful and she couldn't hold them) but she is willing to try again (hoping to only have it placed shortly after IV pain meds give and that seems reasonable.  Will order steroid suppository (has had intolerance to mesalamine in the past).    Milus Banister, MD  01/17/2016, 9:43 AM [facility] Pager [phone]

## 2016-01-17 NOTE — Progress Notes (Signed)
Patient had a large stool containing large amount blood clots.

## 2016-01-18 ENCOUNTER — Telehealth: Payer: Self-pay

## 2016-01-18 DIAGNOSIS — R1012 Left upper quadrant pain: Secondary | ICD-10-CM

## 2016-01-18 MED ORDER — OXYMORPHONE HCL ER 10 MG PO T12A
10.0000 mg | EXTENDED_RELEASE_TABLET | Freq: Two times a day (BID) | ORAL | Status: DC
  Administered 2016-01-18 (×2): 10 mg via ORAL
  Filled 2016-01-18 (×2): qty 1

## 2016-01-18 NOTE — Telephone Encounter (Signed)
Pt scheduled for Remicade infusion 01/28/16 @9am  for the 2 week dose, 10/30@9am  for 6 week dose, and she will then be every 8 weeks. Per Dr. Hilarie Fredrickson pt received 2 doses of Remicade (making it as if 51m/kg dose) as first dose was not helping and she needed to be scheduled for second dose 01/28/16.

## 2016-01-18 NOTE — Progress Notes (Signed)
Pt became agitated stating that she was not given her pain medication sooner, educated patient that med is as needed and that she must call when she in pain  and that medication is not scheduled. RN was gave 48m dose of dilaudid @ 0230 and  pt insisted that her norco be admintered immediately as well. Pt was explained that  dilaudid is for severe pain and that she has  to give the medication time to work, and to determine if medication is  Effective prior to alternative therapies,  and that the RN would be back to reassess her pain. Pt became angry, irritated and began shouting and stated " you better get my medication ASAP, and I will voice my concerns in the morning". Pt was reassessed 30 mins later and was give dose of Norco at 320.

## 2016-01-18 NOTE — Progress Notes (Signed)
PROGRESS NOTE    Sara Barnes  VOJ:[id number] DOB: [birth year] DOA: 01/07/2016 PCP: Ann Held, DO   Chief Complaint  Patient presents with  . Rectal Bleeding    SINCE FRIDAY     Brief Narrative:  HPI on 01/07/2016 by Dr. Gilman Schmidt [name] a 75 y.o.femalewith medical history significant of ulcerative colitis versus Crohn's disease, believed to be UC eventually,otherwise healthy, presents to the emergency room with chief complaint of severe abdominal pain for the past several days. She was recently hospitalized and discharged home about 9 days prior to admission. She was here in the hospital with similar complaints, placed on steroids, and tells me today that she improved some and really wanted to go home. At home, she has had ongoing symptoms, never really well achieved full remission, and over the last few days she has felt extremely unwell. She has crampy abdominal pain, nausea, poor appetite, barely able to keep down some Gatorade, and has been having significant amounts of bloody bowel movements as well. She has no fever or chills. She has no chest pain or shortness of breath. She denies any palpitations. She has no lightheadedness or dizziness. She feels generalized weakness. In the emergency room her vital signs are stable, her blood work is essentially unremarkable. Gastroenterology was consulted by EDP and have evaluated patient, and TRH was asked for admission  Interim history Gastroenterology consultation appreciated. Patient underwent flexible sigmoidoscopy. CMV was negative and she was started on Remicade. She was initially doing better and tolerating some diet, GI following and since still having a lot of pain and frequent BMs will be dosed with another Remicaide infusion on  9/17. 9/21 pain and bloody clots worsened last night.  Assessment & Plan  Abdominal pain secondary to Ulcerative colitis flare with rectal bleeding -Gastroenterology consulted and  appreciated -s/p flexible sigmoidoscopy on 01/08/16, biopsy Showed active erosive ulcer. No CMV found. Patient was started on Remicade on 01/09/2016 and 01/13/16 -GI added on longer acting pain medication today.  -hemoglobin still stable, down to 11 (from 12)  Diarrhea secondary to UC flare -Cdiff negative -Continue supportive care -start gentle hydration.    Depression/Anxiety -Continue prozac  DVT Prophylaxis  Lovenox  Code Status: Full  Family Communication: None at bedside  Disposition Plan: Admitted. Continue to monitor.  Consultants Gastroenterology   Procedures  Flexible sigmoidoscopy  Antibiotics   Anti-infectives    None      Subjective:   Sara Barnes seen and examined today.  Feels pain is tolerable.  Continues to have cramping pain and some bloody clots.  Denies chest pain, shortness of breath, dizziness, headache.  Objective:   Vitals:   01/17/16 0539 01/17/16 2224 01/18/16 0553 01/18/16 1342  BP: 110/72 (!) 108/53 (!) 110/56 (!) 118/56  Pulse: 65 84 70 70  Resp: 18 18 18 18   Temp: 97.5 F (36.4 C) 98 F (36.7 C) 97.8 F (36.6 C) 97.9 F (36.6 C)  TempSrc: Oral Oral Oral Oral  SpO2: 99% 97% 98% 97%  Weight:      Height:        Intake/Output Summary (Last 24 hours) at 01/18/16 1551 Last data filed at 01/18/16 0910  Gross per 24 hour  Intake              480 ml  Output                0 ml  Net  480 ml   Filed Weights   01/07/16 1136  Weight: 72.6 kg (160 lb)    Exam  General: Well developed, well nourished, NAD, appears stated age  HEENT: NCAT, mucous membranes moist.   Cardiovascular: S1 S2 auscultated, no rubs, murmurs or gallops. Regular rate and rhythm.  Respiratory: Clear to auscultation bilaterally with equal chest rise  Abdomen: Soft, mildly tender, nondistended, + bowel sounds  Extremities: warm dry without cyanosis clubbing or edema  Neuro: AAOx3, nonfocal  Psych: Normal affect and demeanor with  intact judgement and insight   Data Reviewed: I have personally reviewed following labs and imaging studies  CBC:  Recent Labs Lab 01/12/16 0913 01/17/16 0828  WBC 6.4  --   HGB 12.2 11.1*  HCT 37.5 34.0*  MCV 94.2  --   PLT 295  --    Basic Metabolic Panel: No results for input(s): NA, K, CL, CO2, GLUCOSE, BUN, CREATININE, CALCIUM, MG, PHOS in the last 168 hours. GFR: Estimated Creatinine Clearance: 62.9 mL/min (by C-G formula based on SCr of 0.65 mg/dL). Liver Function Tests: No results for input(s): AST, ALT, ALKPHOS, BILITOT, PROT, ALBUMIN in the last 168 hours. No results for input(s): LIPASE, AMYLASE in the last 168 hours. No results for input(s): AMMONIA in the last 168 hours. Coagulation Profile: No results for input(s): INR, PROTIME in the last 168 hours. Cardiac Enzymes: No results for input(s): CKTOTAL, CKMB, CKMBINDEX, TROPONINI in the last 168 hours. BNP (last 3 results) No results for input(s): PROBNP in the last 8760 hours. HbA1C: No results for input(s): HGBA1C in the last 72 hours. CBG: No results for input(s): GLUCAP in the last 168 hours. Lipid Profile: No results for input(s): CHOL, HDL, LDLCALC, TRIG, CHOLHDL, LDLDIRECT in the last 72 hours. Thyroid Function Tests: No results for input(s): TSH, T4TOTAL, FREET4, T3FREE, THYROIDAB in the last 72 hours. Anemia Panel: No results for input(s): VITAMINB12, FOLATE, FERRITIN, TIBC, IRON, RETICCTPCT in the last 72 hours. Urine analysis:    Component Value Date/Time   COLORURINE YELLOW 04/19/2015 0838   APPEARANCEUR CLOUDY (A) 04/19/2015 0838   LABSPEC 1.013 04/19/2015 0838   PHURINE 6.0 04/19/2015 0838   GLUCOSEU NEGATIVE 04/19/2015 0838   HGBUR MODERATE (A) 04/19/2015 0838   HGBUR small 12/13/2009 0904   BILIRUBINUR NEGATIVE 04/19/2015 0838   BILIRUBINUR ne 01/30/2015 1543   KETONESUR 40 (A) 04/19/2015 0838   PROTEINUR NEGATIVE 04/19/2015 0838   UROBILINOGEN 0.2 01/30/2015 1543   UROBILINOGEN 0.2  09/25/2013 1350   NITRITE NEGATIVE 04/19/2015 0838   LEUKOCYTESUR TRACE (A) 04/19/2015 0838   Sepsis Labs: @LABRCNTIP (procalcitonin:4,lacticidven:4)  )No results found for this or any previous visit (from the past 240 hour(s)).    Radiology Studies: No results found.   Scheduled Meds: . chlorhexidine  15 mL Mouth/Throat BID  . dicyclomine  10 mg Oral TID AC & HS  . enoxaparin (LOVENOX) injection  40 mg Subcutaneous Q24H  . feeding supplement  1 Container Oral TID BM  . FLUoxetine  20 mg Oral Daily  . hydrocortisone  25 mg Rectal Q0600  . methylPREDNISolone (SOLU-MEDROL) injection  30 mg Intravenous Q12H  . oxymorphone  10 mg Oral Q12H   Continuous Infusions:    LOS: 10 days   Time Spent in minutes   30 minutes  Rhydian Baldi D.O. on 01/18/2016 at 3:51 PM  Between 7am to 7pm - Pager - [phone]  After 7pm go to [email or web address] - password [email or web address]  And look for the night coverage person covering  for me after hours  [facility] Office  [phone]

## 2016-01-18 NOTE — Progress Notes (Signed)
[facility] Progress Note    Since last GI note: She was able to hold steroid suppository for 2 hours yesterday (was given dilaudid just prior to placement). Had a very rough night with pain, rectal discomfort which really set her back.  Objective: Vital signs in last 24 hours: Temp:  [97.8 F (36.6 C)-98 F (36.7 C)] 97.8 F (36.6 C) (09/22 0553) Pulse Rate:  [70-84] 70 (09/22 0553) Resp:  [18] 18 (09/22 0553) BP: (108-110)/(53-56) 110/56 (09/22 0553) SpO2:  [97 %-98 %] 98 % (09/22 0553) Last BM Date: 01/17/16 General: alert and oriented times 3 Heart: regular rate and rythm Abdomen: soft, non-tender, non-distended, normal bowel sounds   Lab Results:  Recent Labs  01/17/16 0828  HGB 11.1*   Medications: Scheduled Meds: . chlorhexidine  15 mL Mouth/Throat BID  . dicyclomine  10 mg Oral TID AC & HS  . enoxaparin (LOVENOX) injection  40 mg Subcutaneous Q24H  . feeding supplement  1 Container Oral TID BM  . FLUoxetine  20 mg Oral Daily  . hydrocortisone  25 mg Rectal Q0600  . methylPREDNISolone (SOLU-MEDROL) injection  30 mg Intravenous Q12H   Continuous Infusions:  PRN Meds:.fluticasone, HYDROcodone-acetaminophen, HYDROmorphone (DILAUDID) injection, ondansetron, zolpidem    Assessment/Plan: 75 y.o. female indeterminent colitis with severe rectal inflammation, ulceration  i've written her for 12 hour release hydromorphone to be given 10am and 8pm (on scheduled basis) to get a nice baseline pain control here. She will still have PRN IV and PO narcotics available.  She will continue Iv steroids, and suppository steroids once daily.      Milus Banister, MD  01/18/2016, 8:38 AM [facility] Pager [phone]

## 2016-01-19 DIAGNOSIS — D649 Anemia, unspecified: Secondary | ICD-10-CM

## 2016-01-19 LAB — BASIC METABOLIC PANEL
Anion gap: 8 (ref 5–15)
BUN: 16 mg/dL (ref 6–20)
CHLORIDE: 101 mmol/L (ref 101–111)
CO2: 25 mmol/L (ref 22–32)
CREATININE: 0.6 mg/dL (ref 0.44–1.00)
Calcium: 8.5 mg/dL — ABNORMAL LOW (ref 8.9–10.3)
GFR calc Af Amer: >60 mL/min (ref >60)
GFR calc non Af Amer: >60 mL/min (ref >60)
GLUCOSE: 114 mg/dL — AB (ref 65–99)
Potassium: 4.2 mmol/L (ref 3.5–5.1)
Sodium: 134 mmol/L — ABNORMAL LOW (ref 135–145)

## 2016-01-19 LAB — CBC
HEMATOCRIT: 30.5 % — AB (ref 36.0–46.0)
HEMOGLOBIN: 9.9 g/dL — AB (ref 12.0–15.0)
MCH: 30 pg (ref 26.0–34.0)
MCHC: 32.5 g/dL (ref 30.0–36.0)
MCV: 92.4 fL (ref 78.0–100.0)
Platelets: 288 10*3/uL (ref 150–400)
RBC: 3.3 MIL/uL — ABNORMAL LOW (ref 3.87–5.11)
RDW: 13.1 % (ref 11.5–15.5)
WBC: 11.2 10*3/uL — ABNORMAL HIGH (ref 4.0–10.5)

## 2016-01-19 LAB — HEMOGLOBIN AND HEMATOCRIT, BLOOD
HCT: 26.9 % — ABNORMAL LOW (ref 36.0–46.0)
Hemoglobin: 8.9 g/dL — ABNORMAL LOW (ref 12.0–15.0)

## 2016-01-19 MED ORDER — OXYMORPHONE HCL ER 10 MG PO T12A
20.0000 mg | EXTENDED_RELEASE_TABLET | Freq: Two times a day (BID) | ORAL | Status: AC
  Administered 2016-01-19 – 2016-01-20 (×4): 20 mg via ORAL
  Filled 2016-01-19 (×4): qty 2

## 2016-01-19 NOTE — Progress Notes (Signed)
Pt passed another large amount of blood clots with minimal stool. Again saturated 2 bedpads.

## 2016-01-19 NOTE — Progress Notes (Signed)
PROGRESS NOTE    Sara Barnes  TDV:[id number] DOB: Nov 21, 1940 DOA: 01/07/2016 PCP: Ann Held, DO   Chief Complaint  Patient presents with  . Rectal Bleeding    SINCE FRIDAY     Brief Narrative:  HPI on 01/07/2016 by Dr. Gilman Schmidt [name] a 75 y.o.femalewith medical history significant of ulcerative colitis versus Crohn's disease, believed to be UC eventually,otherwise healthy, presents to the emergency room with chief complaint of severe abdominal pain for the past several days. She was recently hospitalized and discharged home about 9 days prior to admission. She was here in the hospital with similar complaints, placed on steroids, and tells me today that she improved some and really wanted to go home. At home, she has had ongoing symptoms, never really well achieved full remission, and over the last few days she has felt extremely unwell. She has crampy abdominal pain, nausea, poor appetite, barely able to keep down some Gatorade, and has been having significant amounts of bloody bowel movements as well. She has no fever or chills. She has no chest pain or shortness of breath. She denies any palpitations. She has no lightheadedness or dizziness. She feels generalized weakness. In the emergency room her vital signs are stable, her blood work is essentially unremarkable. Gastroenterology was consulted by EDP and have evaluated patient, and TRH was asked for admission  Interim history Gastroenterology consultation appreciated. Patient underwent flexible sigmoidoscopy. CMV was negative and she was started on Remicade. She was initially doing better and tolerating some diet, GI following and since still having a lot of pain and frequent BMs will be dosed with another Remicaide infusion on  9/17. 9/21 pain and bloody clots worsened last night.  Assessment & Plan  Abdominal pain secondary to Ulcerative colitis flare with rectal bleeding -Gastroenterology consulted and  appreciated -s/p flexible sigmoidoscopy on 01/08/16, biopsy Showed active erosive ulcer. No CMV found. Patient was started on Remicade on 01/09/2016 and 01/13/16 -GI increased Opana to 71m BID.  -hemoglobin down to 9.9(from 12) -Continue to monitor CBC -Cotinue anusol supp, solumedrol, bentyl  Diarrhea secondary to UC flare -Cdiff negative -Continue supportive care  Depression/Anxiety -Continue prozac  DVT Prophylaxis  Lovenox  Code Status: Full  Family Communication: None at bedside  Disposition Plan: Admitted. Continue to monitor.  Consultants Gastroenterology   Procedures  Flexible sigmoidoscopy  Antibiotics   Anti-infectives    None      Subjective:   [name] and examined today.  Continues to have blood stools and rectal pain/cramping. States she was able to get some rest last night. Denies chest pain, shortness of breath, dizziness, headache.  Objective:   Vitals:   01/18/16 0553 01/18/16 1342 01/18/16 2025 01/19/16 0454  BP: (!) 110/56 (!) 118/56 (!) 124/58 101/61  Pulse: 70 70 80 77  Resp: 18 18 17 18   Temp: 97.8 F (36.6 C) 97.9 F (36.6 C) 98.4 F (36.9 C) 98.3 F (36.8 C)  TempSrc: Oral Oral Oral Oral  SpO2: 98% 97% 94% 97%  Weight:      Height:        Intake/Output Summary (Last 24 hours) at 01/19/16 1056 Last data filed at 01/19/16 0808  Gross per 24 hour  Intake              480 ml  Output                0 ml  Net  480 ml   Filed Weights   01/07/16 1136  Weight: 72.6 kg (160 lb)    Exam  General: Well developed, well nourished, NAD  HEENT: NCAT, mucous membranes moist.   Cardiovascular: S1 S2 auscultated,  RRR, no murmurs  Respiratory: Clear to auscultation bilaterally with equal chest rise  Abdomen: Soft, mildly tender, nondistended, + bowel sounds  Extremities: warm dry without cyanosis clubbing or edema  Neuro: AAOx3, nonfocal  Psych: Normal affect and demeanor with intact judgement and  insight, pleasant   Data Reviewed: I have personally reviewed following labs and imaging studies  CBC:  Recent Labs Lab 01/17/16 0828 01/19/16 0544  WBC  --  11.2*  HGB 11.1* 9.9*  HCT 34.0* 30.5*  MCV  --  92.4  PLT  --  568   Basic Metabolic Panel:  Recent Labs Lab 01/19/16 0544  NA 134*  K 4.2  CL 101  CO2 25  GLUCOSE 114*  BUN 16  CREATININE 0.60  CALCIUM 8.5*   GFR: Estimated Creatinine Clearance: 62.9 mL/min (by C-G formula based on SCr of 0.6 mg/dL). Liver Function Tests: No results for input(s): AST, ALT, ALKPHOS, BILITOT, PROT, ALBUMIN in the last 168 hours. No results for input(s): LIPASE, AMYLASE in the last 168 hours. No results for input(s): AMMONIA in the last 168 hours. Coagulation Profile: No results for input(s): INR, PROTIME in the last 168 hours. Cardiac Enzymes: No results for input(s): CKTOTAL, CKMB, CKMBINDEX, TROPONINI in the last 168 hours. BNP (last 3 results) No results for input(s): PROBNP in the last 8760 hours. HbA1C: No results for input(s): HGBA1C in the last 72 hours. CBG: No results for input(s): GLUCAP in the last 168 hours. Lipid Profile: No results for input(s): CHOL, HDL, LDLCALC, TRIG, CHOLHDL, LDLDIRECT in the last 72 hours. Thyroid Function Tests: No results for input(s): TSH, T4TOTAL, FREET4, T3FREE, THYROIDAB in the last 72 hours. Anemia Panel: No results for input(s): VITAMINB12, FOLATE, FERRITIN, TIBC, IRON, RETICCTPCT in the last 72 hours. Urine analysis:    Component Value Date/Time   COLORURINE YELLOW 04/19/2015 0838   APPEARANCEUR CLOUDY (A) 04/19/2015 0838   LABSPEC 1.013 04/19/2015 0838   PHURINE 6.0 04/19/2015 0838   GLUCOSEU NEGATIVE 04/19/2015 0838   HGBUR MODERATE (A) 04/19/2015 0838   HGBUR small 12/13/2009 0904   BILIRUBINUR NEGATIVE 04/19/2015 0838   BILIRUBINUR ne 01/30/2015 1543   KETONESUR 40 (A) 04/19/2015 0838   PROTEINUR NEGATIVE 04/19/2015 0838   UROBILINOGEN 0.2 01/30/2015 1543    UROBILINOGEN 0.2 09/25/2013 1350   NITRITE NEGATIVE 04/19/2015 0838   LEUKOCYTESUR TRACE (A) 04/19/2015 0838   Sepsis Labs: @LABRCNTIP (procalcitonin:4,lacticidven:4)  )No results found for this or any previous visit (from the past 240 hour(s)).    Radiology Studies: No results found.   Scheduled Meds: . chlorhexidine  15 mL Mouth/Throat BID  . dicyclomine  10 mg Oral TID AC & HS  . enoxaparin (LOVENOX) injection  40 mg Subcutaneous Q24H  . feeding supplement  1 Container Oral TID BM  . FLUoxetine  20 mg Oral Daily  . hydrocortisone  25 mg Rectal Q0600  . methylPREDNISolone (SOLU-MEDROL) injection  30 mg Intravenous Q12H  . oxymorphone  20 mg Oral Q12H   Continuous Infusions:    LOS: 11 days   Time Spent in minutes   30 minutes  Kordell Jafri D.O. on 01/19/2016 at 10:56 AM  Between 7am to 7pm - Pager - [phone]  After 7pm go to [email or web address] - password [email or web address]  And look  for the night coverage person covering for me after hours  [facility] Office  [phone]

## 2016-01-19 NOTE — Progress Notes (Signed)
[facility] Progress Note    Since last GI note: 12 hour pain med started yesterday and it seems that this helped.  Her past 24 hours were better than the previous 24 hours; more restful night last night, less persistent rectal pain. Still having urgency, bloody stools, tenesmus.  She was able to hold steroid suppository for second night in a row.  Objective: Vital signs in last 24 hours: Temp:  [97.9 F (36.6 C)-98.4 F (36.9 C)] 98.3 F (36.8 C) (09/23 0454) Pulse Rate:  [70-80] 77 (09/23 0454) Resp:  [17-18] 18 (09/23 0454) BP: (101-124)/(56-61) 101/61 (09/23 0454) SpO2:  [94 %-97 %] 97 % (09/23 0454) Last BM Date: 01/19/16 General: alert and oriented times 3 Heart: regular rate and rythm Abdomen: soft, non-tender, non-distended, normal bowel sounds   Lab Results:  Recent Labs  01/17/16 0828 01/19/16 0544  WBC  --  11.2*  HGB 11.1* 9.9*  PLT  --  288  MCV  --  92.4    Recent Labs  01/19/16 0544  NA 134*  K 4.2  CL 101  CO2 25  GLUCOSE 114*  BUN 16  CREATININE 0.60  CALCIUM 8.5*    Medications: Scheduled Meds: . chlorhexidine  15 mL Mouth/Throat BID  . dicyclomine  10 mg Oral TID AC & HS  . enoxaparin (LOVENOX) injection  40 mg Subcutaneous Q24H  . feeding supplement  1 Container Oral TID BM  . FLUoxetine  20 mg Oral Daily  . hydrocortisone  25 mg Rectal Q0600  . methylPREDNISolone (SOLU-MEDROL) injection  30 mg Intravenous Q12H  . oxymorphone  10 mg Oral Q12H   Continuous Infusions:  PRN Meds:.fluticasone, HYDROcodone-acetaminophen, HYDROmorphone (DILAUDID) injection, ondansetron, zolpidem    Assessment/Plan: 75 y.o. female with indeterminant colitis, severe rectal ulcers  Longer acting oral pain med seems to be helping.  I'm going to increase the dose from 10 to 43m. 144mdid not seem to sedate her much at all but if the 2029ms too sedative then would try 43m22m am and 20mg43m.  She is due for another remicade (43mg/46mon  10/2.  I am hopeful that she will be out of hospital by then.  For now she stays on solumedrol twide daily, steroid suppository once daily.   JacobsMilus Banister9/23/2017, 8:31 AM [facility] Pager [phone]

## 2016-01-19 NOTE — Progress Notes (Signed)
Patient had small loose BM with a large amount of blood that saturated 2 bedpads. Patient states "this is the most that's come out since I've been here." This RN notified the on call physician. Will continue to monitor.

## 2016-01-20 LAB — CBC
HCT: 24.9 % — ABNORMAL LOW (ref 36.0–46.0)
Hemoglobin: 8.2 g/dL — ABNORMAL LOW (ref 12.0–15.0)
MCH: 30.3 pg (ref 26.0–34.0)
MCHC: 32.9 g/dL (ref 30.0–36.0)
MCV: 91.9 fL (ref 78.0–100.0)
PLATELETS: 252 10*3/uL (ref 150–400)
RBC: 2.71 MIL/uL — ABNORMAL LOW (ref 3.87–5.11)
RDW: 13.3 % (ref 11.5–15.5)
WBC: 13.2 10*3/uL — ABNORMAL HIGH (ref 4.0–10.5)

## 2016-01-20 LAB — HEMOGLOBIN AND HEMATOCRIT, BLOOD
HCT: 25 % — ABNORMAL LOW (ref 36.0–46.0)
Hemoglobin: 8.2 g/dL — ABNORMAL LOW (ref 12.0–15.0)

## 2016-01-20 MED ORDER — SODIUM CHLORIDE 0.9 % IV BOLUS (SEPSIS)
500.0000 mL | Freq: Once | INTRAVENOUS | Status: AC
  Administered 2016-01-20: 500 mL via INTRAVENOUS

## 2016-01-20 NOTE — Progress Notes (Addendum)
PROGRESS NOTE    Sara Barnes  GTX:[id number] DOB: 12-20-1940 DOA: 01/07/2016 PCP: Ann Held, DO   Chief Complaint  Patient presents with  . Rectal Bleeding    SINCE FRIDAY     Brief Narrative:  HPI on 01/07/2016 by Dr. Gilman Schmidt [name] a 75 y.o.femalewith medical history significant of ulcerative colitis versus Crohn's disease, believed to be UC eventually,otherwise healthy, presents to the emergency room with chief complaint of severe abdominal pain for the past several days. She was recently hospitalized and discharged home about 9 days prior to admission. She was here in the hospital with similar complaints, placed on steroids, and tells me today that she improved some and really wanted to go home. At home, she has had ongoing symptoms, never really well achieved full remission, and over the last few days she has felt extremely unwell. She has crampy abdominal pain, nausea, poor appetite, barely able to keep down some Gatorade, and has been having significant amounts of bloody bowel movements as well. She has no fever or chills. She has no chest pain or shortness of breath. She denies any palpitations. She has no lightheadedness or dizziness. She feels generalized weakness. In the emergency room her vital signs are stable, her blood work is essentially unremarkable. Gastroenterology was consulted by EDP and have evaluated patient, and TRH was asked for admission  Interim history Gastroenterology consultation appreciated. Patient underwent flexible sigmoidoscopy. CMV was negative and she was started on Remicade. She was initially doing better and tolerating some diet, GI following and since still having a lot of pain and frequent BMs will be dosed with another Remicaide infusion on  9/17. 9/21 pain and bloody clots worsened last night.  Assessment & Plan  Abdominal pain secondary to Ulcerative colitis flare with rectal bleeding -Gastroenterology consulted and  appreciated -s/p flexible sigmoidoscopy on 01/08/16, biopsy Showed active erosive ulcer. No CMV found. Patient was started on Remicade on 01/09/2016 and 01/13/16 -GI increased Opana to 84m BID.  -hemoglobin down to 8.2 (from 12) -Continue to monitor CBC -Continue solumedrol  Acute blood loss anemia -Secondary to the above -If hemoglobin continues to drop, will transfuse  Diarrhea secondary to UC flare -Cdiff negative -Continue supportive care  Depression/Anxiety -Continue prozac  DVT Prophylaxis  Lovenox  Code Status: Full  Family Communication: Son at bedside  Disposition Plan: Admitted. Continue to monitor.  Consultants Gastroenterology   Procedures  Flexible sigmoidoscopy  Antibiotics   Anti-infectives    None      Subjective:   [name] and examined today.  Continues to have bloody stools and rectal pain/crampin, but feels the pain has improved. Denies chest pain, shortness of breath, dizziness, headache.  Objective:   Vitals:   01/19/16 2300 01/20/16 0500 01/20/16 0620 01/20/16 0803  BP: 92/61 (!) 85/75 102/64 (!) 101/53  Pulse: 70 86  74  Resp:  18  16  Temp:  97.7 F (36.5 C)  98.1 F (36.7 C)  TempSrc:  Oral  Oral  SpO2:  96%  96%  Weight:      Height:        Intake/Output Summary (Last 24 hours) at 01/20/16 1122 Last data filed at 01/20/16 0933  Gross per 24 hour  Intake              675 ml  Output                2 ml  Net  673 ml   Filed Weights   01/07/16 1136  Weight: 72.6 kg (160 lb)    Exam  General: Well developed, well nourished, NAD  HEENT: NCAT, mucous membranes moist.   Cardiovascular: S1 S2 auscultated,  RRR, no murmurs  Respiratory: Clear to auscultation bilaterally with equal chest rise  Abdomen: Soft, mildly tender, nondistended, + bowel sounds  Extremities: warm dry without cyanosis clubbing or edema  Neuro: AAOx3, nonfocal  Psych: Appropriate mood and affect   Data Reviewed: I  have personally reviewed following labs and imaging studies  CBC:  Recent Labs Lab 01/17/16 0828 01/19/16 0544 01/19/16 2150 01/20/16 0053 01/20/16 0548  WBC  --  11.2*  --   --  13.2*  HGB 11.1* 9.9* 8.9* 8.2* 8.2*  HCT 34.0* 30.5* 26.9* 25.0* 24.9*  MCV  --  92.4  --   --  91.9  PLT  --  288  --   --  765   Basic Metabolic Panel:  Recent Labs Lab 01/19/16 0544  NA 134*  K 4.2  CL 101  CO2 25  GLUCOSE 114*  BUN 16  CREATININE 0.60  CALCIUM 8.5*   GFR: Estimated Creatinine Clearance: 62.9 mL/min (by C-G formula based on SCr of 0.6 mg/dL). Liver Function Tests: No results for input(s): AST, ALT, ALKPHOS, BILITOT, PROT, ALBUMIN in the last 168 hours. No results for input(s): LIPASE, AMYLASE in the last 168 hours. No results for input(s): AMMONIA in the last 168 hours. Coagulation Profile: No results for input(s): INR, PROTIME in the last 168 hours. Cardiac Enzymes: No results for input(s): CKTOTAL, CKMB, CKMBINDEX, TROPONINI in the last 168 hours. BNP (last 3 results) No results for input(s): PROBNP in the last 8760 hours. HbA1C: No results for input(s): HGBA1C in the last 72 hours. CBG: No results for input(s): GLUCAP in the last 168 hours. Lipid Profile: No results for input(s): CHOL, HDL, LDLCALC, TRIG, CHOLHDL, LDLDIRECT in the last 72 hours. Thyroid Function Tests: No results for input(s): TSH, T4TOTAL, FREET4, T3FREE, THYROIDAB in the last 72 hours. Anemia Panel: No results for input(s): VITAMINB12, FOLATE, FERRITIN, TIBC, IRON, RETICCTPCT in the last 72 hours. Urine analysis:    Component Value Date/Time   COLORURINE YELLOW 04/19/2015 0838   APPEARANCEUR CLOUDY (A) 04/19/2015 0838   LABSPEC 1.013 04/19/2015 0838   PHURINE 6.0 04/19/2015 0838   GLUCOSEU NEGATIVE 04/19/2015 0838   HGBUR MODERATE (A) 04/19/2015 0838   HGBUR small 12/13/2009 0904   BILIRUBINUR NEGATIVE 04/19/2015 0838   BILIRUBINUR ne 01/30/2015 1543   KETONESUR 40 (A) 04/19/2015 0838    PROTEINUR NEGATIVE 04/19/2015 0838   UROBILINOGEN 0.2 01/30/2015 1543   UROBILINOGEN 0.2 09/25/2013 1350   NITRITE NEGATIVE 04/19/2015 0838   LEUKOCYTESUR TRACE (A) 04/19/2015 0838   Sepsis Labs: @LABRCNTIP (procalcitonin:4,lacticidven:4)  )No results found for this or any previous visit (from the past 240 hour(s)).    Radiology Studies: No results found.   Scheduled Meds: . chlorhexidine  15 mL Mouth/Throat BID  . dicyclomine  10 mg Oral TID AC & HS  . enoxaparin (LOVENOX) injection  40 mg Subcutaneous Q24H  . FLUoxetine  20 mg Oral Daily  . hydrocortisone  25 mg Rectal Q0600  . methylPREDNISolone (SOLU-MEDROL) injection  30 mg Intravenous Q12H  . oxymorphone  20 mg Oral Q12H   Continuous Infusions:    LOS: 12 days   Time Spent in minutes   30 minutes  Canuto Kingston D.O. on 01/20/2016 at 11:22 AM  Between 7am to  7pm - Pager - [phone]  After 7pm go to [email or web address] - password [email or web address]  And look for the night coverage person covering for me after hours  [facility] Office  [phone]

## 2016-01-20 NOTE — Care Management Important Message (Signed)
Important Message  Patient Details  Name: Sara Barnes MRN: [id number] Date of Birth: 1940-10-19   Medicare Important Message Given:  Yes    Apolonio Schneiders, RN 01/20/2016, 3:34 PM

## 2016-01-20 NOTE — Progress Notes (Signed)
Pt has expressed concern of difficulty swallowing and new onset of tremors. Pt states she "has not been able to swallow well and has been spilling water when trying to drink." RN made aware.

## 2016-01-20 NOTE — Progress Notes (Signed)
[facility] Progress Note    Since last GI note: 2 episodes of blood clots per rectum last night.  This was very disconcerting for. Her rectal pain is under much better control.  Objective: Vital signs in last 24 hours: Temp:  [97.7 F (36.5 C)-98.5 F (36.9 C)] 98.1 F (36.7 C) (09/24 0803) Pulse Rate:  [70-86] 74 (09/24 0803) Resp:  [16-18] 16 (09/24 0803) BP: (85-115)/(53-75) 101/53 (09/24 0803) SpO2:  [96 %-97 %] 96 % (09/24 0803) Last BM Date: 01/20/16 General: alert and oriented times 3 Heart: regular rate and rythm Abdomen: soft, non-tender, non-distended, normal bowel sounds   Lab Results:  Recent Labs  01/19/16 0544 01/19/16 2150 01/20/16 0053 01/20/16 0548  WBC 11.2*  --   --  13.2*  HGB 9.9* 8.9* 8.2* 8.2*  PLT 288  --   --  252  MCV 92.4  --   --  91.9    Recent Labs  01/19/16 0544  NA 134*  K 4.2  CL 101  CO2 25  GLUCOSE 114*  BUN 16  CREATININE 0.60  CALCIUM 8.5*    Medications: Scheduled Meds: . chlorhexidine  15 mL Mouth/Throat BID  . dicyclomine  10 mg Oral TID AC & HS  . enoxaparin (LOVENOX) injection  40 mg Subcutaneous Q24H  . FLUoxetine  20 mg Oral Daily  . hydrocortisone  25 mg Rectal Q0600  . methylPREDNISolone (SOLU-MEDROL) injection  30 mg Intravenous Q12H  . oxymorphone  20 mg Oral Q12H   Continuous Infusions:  PRN Meds:.fluticasone, HYDROcodone-acetaminophen, HYDROmorphone (DILAUDID) injection, ondansetron, zolpidem    Assessment/Plan: 75 y.o. female with indeterminate colitis, severe rectal ulcers  Bleeding seems to have increased, this is after 3 days of once daily steroid suppository and so perhaps there is a relation however the suppository was placed in early afternoon and the blood clots were around midnight.  Will continue current management with IV steroids, PR steroids for now.  I asked her to keep a record of her BMs in 24 hour period.  Encouraged her to ambulate and eat well as well.  Pending next  24 hours, hopefully change to oral steroids tomorrow.      Milus Banister, MD  01/20/2016, 9:25 AM [facility] Pager [phone]

## 2016-01-21 ENCOUNTER — Inpatient Hospital Stay (HOSPITAL_COMMUNITY): Payer: Commercial Managed Care - HMO

## 2016-01-21 ENCOUNTER — Encounter (HOSPITAL_COMMUNITY): Payer: Self-pay | Admitting: Radiology

## 2016-01-21 DIAGNOSIS — K523 Indeterminate colitis: Secondary | ICD-10-CM

## 2016-01-21 DIAGNOSIS — D500 Iron deficiency anemia secondary to blood loss (chronic): Secondary | ICD-10-CM

## 2016-01-21 LAB — CBC
HCT: 23.1 % — ABNORMAL LOW (ref 36.0–46.0)
HEMOGLOBIN: 7.6 g/dL — AB (ref 12.0–15.0)
MCH: 30.9 pg (ref 26.0–34.0)
MCHC: 32.9 g/dL (ref 30.0–36.0)
MCV: 93.9 fL (ref 78.0–100.0)
PLATELETS: 238 10*3/uL (ref 150–400)
RBC: 2.46 MIL/uL — AB (ref 3.87–5.11)
RDW: 13.5 % (ref 11.5–15.5)
WBC: 14.2 10*3/uL — ABNORMAL HIGH (ref 4.0–10.5)

## 2016-01-21 LAB — BASIC METABOLIC PANEL
ANION GAP: 7 (ref 5–15)
BUN: 23 mg/dL — AB (ref 6–20)
CALCIUM: 8.5 mg/dL — AB (ref 8.9–10.3)
CO2: 25 mmol/L (ref 22–32)
Chloride: 102 mmol/L (ref 101–111)
Creatinine, Ser: 0.84 mg/dL (ref 0.44–1.00)
GFR calc Af Amer: >60 mL/min (ref >60)
GLUCOSE: 94 mg/dL (ref 65–99)
Potassium: 3.8 mmol/L (ref 3.5–5.1)
SODIUM: 134 mmol/L — AB (ref 135–145)

## 2016-01-21 LAB — C-REACTIVE PROTEIN: CRP: 14.1 mg/dL — AB (ref <1.0)

## 2016-01-21 LAB — C DIFFICILE QUICK SCREEN W PCR REFLEX
C DIFFICILE (CDIFF) INTERP: NOT DETECTED
C DIFFICILE (CDIFF) TOXIN: NEGATIVE
C Diff antigen: NEGATIVE

## 2016-01-21 LAB — HEMOGLOBIN AND HEMATOCRIT, BLOOD
HCT: 29.9 % — ABNORMAL LOW (ref 36.0–46.0)
HEMOGLOBIN: 10 g/dL — AB (ref 12.0–15.0)

## 2016-01-21 MED ORDER — OXYMORPHONE HCL ER 10 MG PO T12A
30.0000 mg | EXTENDED_RELEASE_TABLET | Freq: Every day | ORAL | Status: DC
Start: 2016-01-21 — End: 2016-01-25
  Administered 2016-01-21 – 2016-01-24 (×4): 30 mg via ORAL
  Filled 2016-01-21 (×4): qty 3

## 2016-01-21 MED ORDER — OXYMORPHONE HCL ER 10 MG PO T12A
20.0000 mg | EXTENDED_RELEASE_TABLET | Freq: Every day | ORAL | Status: DC
  Administered 2016-01-21 – 2016-01-25 (×4): 20 mg via ORAL
  Filled 2016-01-21 (×4): qty 2

## 2016-01-21 MED ORDER — SODIUM CHLORIDE 0.9 % IV BOLUS (SEPSIS)
500.0000 mL | Freq: Once | INTRAVENOUS | Status: AC
  Administered 2016-01-21: 500 mL via INTRAVENOUS

## 2016-01-21 MED ORDER — OXYMORPHONE HCL ER 10 MG PO T12A: 20.0000 mg | EXTENDED_RELEASE_TABLET | Freq: Every day | ORAL | Status: DC

## 2016-01-21 MED ORDER — IOPAMIDOL (ISOVUE-300) INJECTION 61%
30.0000 mL | Freq: Once | INTRAVENOUS | Status: AC
  Administered 2016-01-21: 30 mL via ORAL

## 2016-01-21 MED ORDER — SODIUM CHLORIDE 0.9 % IV SOLN
Freq: Once | INTRAVENOUS | Status: AC
  Administered 2016-01-21: 10:00:00 via INTRAVENOUS

## 2016-01-21 MED ORDER — IOPAMIDOL (ISOVUE-300) INJECTION 61%
100.0000 mL | Freq: Once | INTRAVENOUS | Status: AC | PRN
  Administered 2016-01-21: 100 mL via INTRAVENOUS

## 2016-01-21 MED ORDER — CIPROFLOXACIN IN D5W 400 MG/200ML IV SOLN
400.0000 mg | Freq: Two times a day (BID) | INTRAVENOUS | Status: DC
Start: 2016-01-21 — End: 2016-01-22
  Administered 2016-01-21: 400 mg via INTRAVENOUS
  Filled 2016-01-21: qty 200

## 2016-01-21 MED ORDER — METRONIDAZOLE IN NACL 5-0.79 MG/ML-% IV SOLN
500.0000 mg | Freq: Four times a day (QID) | INTRAVENOUS | Status: DC
  Administered 2016-01-21 – 2016-01-22 (×3): 500 mg via INTRAVENOUS
  Filled 2016-01-21 (×3): qty 100

## 2016-01-21 NOTE — Progress Notes (Signed)
    Progress Note   Subjective  Chief Complaint: UC-rectal involvement  Last nigth was "very bad" for the patient as she had a fall while in the bathroom (see nursing notes) and blames this on the fact that she was in a lot of pain and was unable to receive "one of my pain med doses". She has some lingering "soreness" on her left hip this morning and would like to make sure that this is "checked out" when appropriate.   I spent 30-40 min in the room discussing the patient's care with her and her son, we went through her pain medication doses and reviewed labs. It appears that 2u prbcs have been order to transfuse today. Per pt and her son, the "biggest problem" comes at night and they would like to know if she can be on more pain meds at night to help her through.  The patient is visibly frustrated with how long she has been here and hoe "slow" everything is coming. Overall she notes that she does feel "some better" since her admission two weeks ago. Still with loose, painful, bloody BM overnight.   Objective   Vital signs in last 24 hours: Temp:  [98.3 F (36.8 C)-99.5 F (37.5 C)] 99.5 F (37.5 C) (09/25 0512) Pulse Rate:  [88-138] 116 (09/25 0512) Resp:  [14-16] 16 (09/25 0512) BP: (90-118)/(54-79) 90/54 (09/25 0512) SpO2:  [95 %-99 %] 97 % (09/25 0512) Last BM Date: 01/20/16 General: Alert and oriented x3 Heart:  Regular rate and rhythm; no murmurs Lungs: Respirations even and unlabored, lungs CTA bilaterally Abdomen:  Soft, nontender and mild ttp b/l lower quadrants Normal bowel sounds. Extremities:  Without edema. Neurologic:  Alert and oriented,  grossly normal neurologically. Psych:  Cooperative. Normal mood and affect.  Intake/Output from previous day: 09/24 0701 - 09/25 0700 In: 945 [P.O.:945] Out: 9 [Urine:4; Stool:5] Intake/Output this shift: Total I/O In: 120 [P.O.:120] Out: 1 [Stool:1]  Lab Results:  Recent Labs  01/19/16 0544  01/20/16 0053 01/20/16 0548  01/21/16 0522  WBC 11.2*  --   --  13.2* 14.2*  HGB 9.9*  < > 8.2* 8.2* 7.6*  HCT 30.5*  < > 25.0* 24.9* 23.1*  PLT 288  --   --  252 238  < > = values in this interval not displayed. BMET  Recent Labs  01/19/16 0544 01/21/16 0522  NA 134* 134*  K 4.2 3.8  CL 101 102  CO2 25 25  GLUCOSE 114* 94  BUN 16 23*  CREATININE 0.60 0.84  CALCIUM 8.5* 8.5*   LFT No results for input(s): PROT, ALBUMIN, AST, ALT, ALKPHOS, BILITOT, BILIDIR, IBILI in the last 72 hours. PT/INR No results for input(s): LABPROT, INR in the last 72 hours.  Studies/Results: No results found.     Assessment / Plan:   Assessment: 1. 75 y.o. female with indeterminate colitis, severe rectal ulcers  Plan: 1. Continue IV steroids, PR steroids as well 2. Per chart review WBC and CRP increasing, patient may need redosing with Remicade now 3. Will also consider repeat CT +/- flex sig to check on state of colitis at this time 4. Discussed above with Dr. Hilarie Fredrickson, please await further recommendations from him  Thank you for your kind consultation, we will continue to follow.    LOS: 13 days   Levin Erp  01/21/2016, 9:25 AM  Pager # [phone]

## 2016-01-21 NOTE — Progress Notes (Signed)
Nutrition Brief Note  Patient identified to be seen due to LOS of 13 days.   Wt Readings from Last 15 Encounters:  01/07/16 160 lb (72.6 kg)  12/26/15 169 lb 12.1 oz (77 kg)  12/24/15 159 lb (72.1 kg)  11/28/15 162 lb 2 oz (73.5 kg)  09/25/15 157 lb (71.2 kg)  09/11/15 157 lb (71.2 kg)  06/15/15 157 lb 12.8 oz (71.6 kg)  06/11/15 160 lb (72.6 kg)  05/14/15 152 lb 4 oz (69.1 kg)  04/18/15 151 lb 7.3 oz (68.7 kg)  04/11/15 157 lb 3.2 oz (71.3 kg)  03/01/15 154 lb 11.2 oz (70.2 kg)  02/20/15 155 lb 4.8 oz (70.4 kg)  02/13/15 153 lb 4.8 oz (69.5 kg)  02/06/15 155 lb 1.6 oz (70.4 kg)    Body mass index is 25.82 kg/m. Patient meets criteria for Overweight based on current BMI.   Current diet order is Soft, patient is consuming approximately 75-100% of meals at this time. She reports appetite and intake are good and like she would eat at home. Labs and medications reviewed.   No nutrition interventions warranted at this time. If nutrition issues arise, please consult RD.   Willey Blade, MS, RD, LDN Pager: [phone] After Hours Pager: [phone]

## 2016-01-21 NOTE — Progress Notes (Signed)
PROGRESS NOTE    Sara Barnes  CZY:[id number] DOB: [birth year] DOA: 01/07/2016 PCP: Ann Held, DO   Chief Complaint  Patient presents with  . Rectal Bleeding    SINCE FRIDAY     Brief Narrative:  HPI on 01/07/2016 by Dr. Gilman Schmidt Barnes a 75 y.o.femalewith medical history significant of ulcerative colitis versus Crohn's disease, believed to be UC eventually,otherwise healthy, presents to the emergency room with chief complaint of severe abdominal pain for the past several days. She was recently hospitalized and discharged home about 9 days prior to admission. She was here in the hospital with similar complaints, placed on steroids, and tells me today that she improved some and really wanted to go home. At home, she has had ongoing symptoms, never really well achieved full remission, and over the last few days she has felt extremely unwell. She has crampy abdominal pain, nausea, poor appetite, barely able to keep down some Gatorade, and has been having significant amounts of bloody bowel movements as well. She has no fever or chills. She has no chest pain or shortness of breath. She denies any palpitations. She has no lightheadedness or dizziness. She feels generalized weakness. In the emergency room her vital signs are stable, her blood work is essentially unremarkable. Gastroenterology was consulted by EDP and have evaluated patient, and TRH was asked for admission  Interim history Gastroenterology consultation appreciated. Patient underwent flexible sigmoidoscopy. CMV was negative and she was started on Remicade. She was initially doing better and tolerating some diet, GI following and since still having a lot of pain and frequent BMs will be dosed with another Remicaide infusion on  9/17. 9/21 pain and bloody clots worsened last night.  Assessment & Plan  Abdominal pain secondary to Ulcerative colitis flare with rectal bleeding -Gastroenterology consulted and  appreciated -s/p flexible sigmoidoscopy on 01/08/16, biopsy Showed active erosive ulcer. No CMV found. Patient was started on Remicade on 01/09/2016 and 01/13/16 -GI increased Opana to 71m BID.  -Continue to monitor CBC -Continue solumedrol -CRP increasing -GI recommended possibly redosing remicaide and obtaining CT abd/pelvis. [facility]   Acute blood loss anemia -Secondary to the above -hemoglobin 7.6, will tranfuse 2uPRBCs  Diarrhea secondary to UC flare -Cdiff negative -Continue supportive care  Depression/Anxiety -Continue prozac   DVT Prophylaxis  Lovenox  Code Status: Full  Family Communication: Son at bedside  Disposition Plan: Admitted. Continue to monitor. Will give blood transfusion. Pending further recommendations from GI.  Consultants Gastroenterology   Procedures  Flexible sigmoidoscopy  Antibiotics   Anti-infectives    None      Subjective:   CDarnice Comrieseen and examined today.  Continues to have bloody stools and rectal pain/cramping.   Denies chest pain, shortness of breath, dizziness, headache.  Objective:   Vitals:   01/21/16 0305 01/21/16 0512 01/21/16 0946 01/21/16 1013  BP: 110/64 (!) 90/54 (!) 84/47 (!) 74/52  Pulse: (!) 138 (!) 116 95 89  Resp: 16 16 16 16   Temp: 99.4 F (37.4 C) 99.5 F (37.5 C) 98.4 F (36.9 C) 98.5 F (36.9 C)  TempSrc: Oral Oral Oral Oral  SpO2: 95% 97% 98% 98%  Weight:      Height:        Intake/Output Summary (Last 24 hours) at 01/21/16 1216 Last data filed at 01/21/16 0847  Gross per 24 hour  Intake              600 ml  Output                6 ml  Net              594 ml   Filed Weights   01/07/16 1136  Weight: 72.6 kg (160 lb)    Exam  General: Well developed, well nourished, no distress  HEENT: NCAT, mucous membranes moist.   Cardiovascular: S1 S2 auscultated,  RRR, no murmurs  Respiratory: Clear to auscultation bilaterally with equal chest rise  Abdomen: Soft,  mildly TTP lower abdomen, nondistended, + bowel sounds  Extremities: warm dry without cyanosis clubbing or edema  Neuro: AAOx3, nonfocal  Psych: Appropriate mood and affect   Data Reviewed: I have personally reviewed following labs and imaging studies  CBC:  Recent Labs Lab 01/19/16 0544 01/19/16 2150 01/20/16 0053 01/20/16 0548 01/21/16 0522  WBC 11.2*  --   --  13.2* 14.2*  HGB 9.9* 8.9* 8.2* 8.2* 7.6*  HCT 30.5* 26.9* 25.0* 24.9* 23.1*  MCV 92.4  --   --  91.9 93.9  PLT 288  --   --  252 532   Basic Metabolic Panel:  Recent Labs Lab 01/19/16 0544 01/21/16 0522  NA 134* 134*  K 4.2 3.8  CL 101 102  CO2 25 25  GLUCOSE 114* 94  BUN 16 23*  CREATININE 0.60 0.84  CALCIUM 8.5* 8.5*   GFR: Estimated Creatinine Clearance: 59.9 mL/min (by C-G formula based on SCr of 0.84 mg/dL). Liver Function Tests: No results for input(s): AST, ALT, ALKPHOS, BILITOT, PROT, ALBUMIN in the last 168 hours. No results for input(s): LIPASE, AMYLASE in the last 168 hours. No results for input(s): AMMONIA in the last 168 hours. Coagulation Profile: No results for input(s): INR, PROTIME in the last 168 hours. Cardiac Enzymes: No results for input(s): CKTOTAL, CKMB, CKMBINDEX, TROPONINI in the last 168 hours. BNP (last 3 results) No results for input(s): PROBNP in the last 8760 hours. HbA1C: No results for input(s): HGBA1C in the last 72 hours. CBG: No results for input(s): GLUCAP in the last 168 hours. Lipid Profile: No results for input(s): CHOL, HDL, LDLCALC, TRIG, CHOLHDL, LDLDIRECT in the last 72 hours. Thyroid Function Tests: No results for input(s): TSH, T4TOTAL, FREET4, T3FREE, THYROIDAB in the last 72 hours. Anemia Panel: No results for input(s): VITAMINB12, FOLATE, FERRITIN, TIBC, IRON, RETICCTPCT in the last 72 hours. Urine analysis:    Component Value Date/Time   COLORURINE YELLOW 04/19/2015 0838   APPEARANCEUR CLOUDY (A) 04/19/2015 0838   LABSPEC 1.013 04/19/2015  0838   PHURINE 6.0 04/19/2015 0838   GLUCOSEU NEGATIVE 04/19/2015 0838   HGBUR MODERATE (A) 04/19/2015 0838   HGBUR small 12/13/2009 0904   BILIRUBINUR NEGATIVE 04/19/2015 0838   BILIRUBINUR ne 01/30/2015 1543   KETONESUR 40 (A) 04/19/2015 0838   PROTEINUR NEGATIVE 04/19/2015 0838   UROBILINOGEN 0.2 01/30/2015 1543   UROBILINOGEN 0.2 09/25/2013 1350   NITRITE NEGATIVE 04/19/2015 0838   LEUKOCYTESUR TRACE (A) 04/19/2015 0838   Sepsis Labs: @LABRCNTIP (procalcitonin:4,lacticidven:4)  )No results found for this or any previous visit (from the past 240 hour(s)).    Radiology Studies: No results found.   Scheduled Meds: . chlorhexidine  15 mL Mouth/Throat BID  . dicyclomine  10 mg Oral TID AC & HS  . enoxaparin (LOVENOX) injection  40 mg Subcutaneous Q24H  . FLUoxetine  20 mg Oral Daily  . hydrocortisone  25 mg Rectal Q0600  . iopamidol  30 mL Oral Once  . methylPREDNISolone (  SOLU-MEDROL) injection  30 mg Intravenous Q12H  . oxymorphone  20 mg Oral QAC breakfast  . oxymorphone  30 mg Oral QHS   Continuous Infusions:    LOS: 13 days   Time Spent in minutes   30 minutes  Shawny Borkowski D.O. on 01/21/2016 at 12:16 PM  Between 7am to 7pm - Pager - [phone]  After 7pm go to [email or web address] - password [email or web address]  And look for the night coverage person covering for me after hours  [facility] Office  [phone]

## 2016-01-21 NOTE — Progress Notes (Signed)
CT scan results called to Dr. Ree Kida, and Dr. Carlean Purl.  Dr. Carlean Purl started patient on Cipro and Flagyl, based on results.

## 2016-01-21 NOTE — Progress Notes (Signed)
This shift pt had a fall  While cleaning self after toileting. Pt stated that she was " weak in the legs and fell '". Found as she was going down on her knees. Cleaned up pt and asssited back to bed with assistance of other staff. VS 98.5*118/76*91HR*14   Pt alert and oriented, assessment and neuro checks completed. Attending K. Schorr notified. Also notified son of event. Will continue to monitor and intervene appropriately.,

## 2016-01-21 NOTE — Progress Notes (Signed)
   01/21/16 1300  Clinical Encounter Type  Visited With Patient  Visit Type Initial;Psychological support  Stress Factors  Patient Stress Factors Health changes  Counseling intern visited with patient to offer counseling and emotional support due to patient's length of stay. Patient appeared alert and oriented and spoke clearly and coherently throughout visit. Patient indicated that she feels very supported by her family and friends, though she has limited her visitors list to immediate family due to having more frequent trips to the bathroom and to ensure she gets adequate rest. Patient's son and sister are her main sources of support. Patient indicates that she is coping with the news of the change in her condition through prayer and getting through each day "one day at a time."  Intern informed patient that spiritual care services are also available. Patient was open to intern visiting again during the week.  Lamount Cohen, Counseling Intern Department for Spiritual Care and [facility] Supervisor - [address] [place], [place]

## 2016-01-22 ENCOUNTER — Encounter (HOSPITAL_COMMUNITY)
Admission: EM | Disposition: A | Discharge status: Skilled Nursing Facility | Payer: Self-pay | Source: Home / Self Care | Attending: Nephrology

## 2016-01-22 ENCOUNTER — Inpatient Hospital Stay (HOSPITAL_COMMUNITY): Payer: Commercial Managed Care - HMO | Admitting: Certified Registered Nurse Anesthetist

## 2016-01-22 ENCOUNTER — Encounter (HOSPITAL_COMMUNITY): Payer: Self-pay | Admitting: Certified Registered Nurse Anesthetist

## 2016-01-22 ENCOUNTER — Encounter (HOSPITAL_COMMUNITY): Payer: Self-pay | Admitting: Anesthesiology

## 2016-01-22 DIAGNOSIS — K611 Rectal abscess: Secondary | ICD-10-CM

## 2016-01-22 DIAGNOSIS — K610 Anal abscess: Secondary | ICD-10-CM

## 2016-01-22 HISTORY — PX: INCISION AND DRAINAGE PERIRECTAL ABSCESS: SHX1804

## 2016-01-22 LAB — CBC WITH DIFFERENTIAL/PLATELET
BASOS ABS: 0 10*3/uL (ref 0.0–0.1)
BASOS PCT: 0 %
EOS ABS: 0 10*3/uL (ref 0.0–0.7)
Eosinophils Relative: 0 %
HCT: 25.6 % — ABNORMAL LOW (ref 36.0–46.0)
Hemoglobin: 8.6 g/dL — ABNORMAL LOW (ref 12.0–15.0)
LYMPHS PCT: 2 %
Lymphs Abs: 0.2 10*3/uL — ABNORMAL LOW (ref 0.7–4.0)
MCH: 30.6 pg (ref 26.0–34.0)
MCHC: 33.6 g/dL (ref 30.0–36.0)
MCV: 91.1 fL (ref 78.0–100.0)
MONO ABS: 0.8 10*3/uL (ref 0.1–1.0)
MONOS PCT: 7 %
NEUTROS ABS: 10.8 10*3/uL — AB (ref 1.7–7.7)
NEUTROS PCT: 91 %
PLATELETS: 165 10*3/uL (ref 150–400)
RBC: 2.81 MIL/uL — ABNORMAL LOW (ref 3.87–5.11)
RDW: 14.6 % (ref 11.5–15.5)
WBC: 11.8 10*3/uL — ABNORMAL HIGH (ref 4.0–10.5)

## 2016-01-22 LAB — COMPREHENSIVE METABOLIC PANEL
ALBUMIN: 2 g/dL — AB (ref 3.5–5.0)
ALT: 14 U/L (ref 14–54)
ANION GAP: 5 (ref 5–15)
AST: 12 U/L — ABNORMAL LOW (ref 15–41)
Alkaline Phosphatase: 69 U/L (ref 38–126)
BUN: 16 mg/dL (ref 6–20)
CHLORIDE: 107 mmol/L (ref 101–111)
CO2: 26 mmol/L (ref 22–32)
Calcium: 8.2 mg/dL — ABNORMAL LOW (ref 8.9–10.3)
Creatinine, Ser: 0.57 mg/dL (ref 0.44–1.00)
GFR calc non Af Amer: >60 mL/min (ref >60)
Glucose, Bld: 105 mg/dL — ABNORMAL HIGH (ref 65–99)
POTASSIUM: 4.1 mmol/L (ref 3.5–5.1)
SODIUM: 138 mmol/L (ref 135–145)
Total Bilirubin: 0.4 mg/dL (ref 0.3–1.2)
Total Protein: 4.6 g/dL — ABNORMAL LOW (ref 6.5–8.1)

## 2016-01-22 LAB — PREPARE RBC (CROSSMATCH)

## 2016-01-22 LAB — C-REACTIVE PROTEIN: CRP: 19.4 mg/dL — AB (ref <1.0)

## 2016-01-22 LAB — MRSA PCR SCREENING: MRSA BY PCR: NEGATIVE

## 2016-01-22 SURGERY — INCISION AND DRAINAGE, ABSCESS, PERIRECTAL
Anesthesia: General

## 2016-01-22 SURGERY — CANCELLED PROCEDURE

## 2016-01-22 MED ORDER — MUPIROCIN 2 % EX OINT
TOPICAL_OINTMENT | CUTANEOUS | Status: AC
  Administered 2016-01-22: 11:00:00
  Filled 2016-01-22: qty 22

## 2016-01-22 MED ORDER — SODIUM CHLORIDE 0.9 % IV SOLN
Freq: Once | INTRAVENOUS | Status: AC
Start: 2016-01-22 — End: 2016-01-22
  Administered 2016-01-22: 11:00:00 via INTRAVENOUS

## 2016-01-22 MED ORDER — METOCLOPRAMIDE HCL 5 MG/ML IJ SOLN
INTRAMUSCULAR | Status: DC | PRN
  Administered 2016-01-22: 10 mg via INTRAVENOUS

## 2016-01-22 MED ORDER — SODIUM CHLORIDE 0.9 % IV SOLN
INTRAVENOUS | Status: DC
  Administered 2016-01-22 – 2016-02-07 (×15): via INTRAVENOUS

## 2016-01-22 MED ORDER — 0.9 % SODIUM CHLORIDE (POUR BTL) OPTIME
TOPICAL | Status: DC | PRN
  Administered 2016-01-22: 1000 mL

## 2016-01-22 MED ORDER — VITAMINS A & D EX OINT
TOPICAL_OINTMENT | CUTANEOUS | Status: AC
  Administered 2016-01-22: 5
  Filled 2016-01-22: qty 5

## 2016-01-22 MED ORDER — LACTATED RINGERS IV SOLN
INTRAVENOUS | Status: DC | PRN
  Administered 2016-01-22: 12:00:00 via INTRAVENOUS

## 2016-01-22 MED ORDER — PROMETHAZINE HCL 25 MG/ML IJ SOLN: 6.2500 mg | INTRAMUSCULAR | Status: DC | PRN

## 2016-01-22 MED ORDER — PROPOFOL 10 MG/ML IV BOLUS
INTRAVENOUS | Status: AC
  Filled 2016-01-22: qty 20

## 2016-01-22 MED ORDER — LIDOCAINE HCL (CARDIAC) 20 MG/ML IV SOLN
INTRAVENOUS | Status: DC | PRN
  Administered 2016-01-22: 80 mg via INTRAVENOUS

## 2016-01-22 MED ORDER — FENTANYL CITRATE (PF) 100 MCG/2ML IJ SOLN
INTRAMUSCULAR | Status: DC | PRN
  Administered 2016-01-22: 25 ug via INTRAVENOUS
  Administered 2016-01-22 (×2): 50 ug via INTRAVENOUS
  Administered 2016-01-22 (×3): 25 ug via INTRAVENOUS

## 2016-01-22 MED ORDER — FENTANYL CITRATE (PF) 100 MCG/2ML IJ SOLN
25.0000 ug | INTRAMUSCULAR | Status: DC | PRN
  Administered 2016-01-22 (×2): 50 ug via INTRAVENOUS

## 2016-01-22 MED ORDER — ONDANSETRON HCL 4 MG/2ML IJ SOLN
INTRAMUSCULAR | Status: DC | PRN
  Administered 2016-01-22: 4 mg via INTRAVENOUS

## 2016-01-22 MED ORDER — PIPERACILLIN-TAZOBACTAM 3.375 G IVPB
3.3750 g | Freq: Three times a day (TID) | INTRAVENOUS | Status: DC
  Administered 2016-01-22 – 2016-01-29 (×22): 3.375 g via INTRAVENOUS
  Filled 2016-01-22 (×16): qty 50

## 2016-01-22 MED ORDER — METHYLPREDNISOLONE SODIUM SUCC 40 MG IJ SOLR
20.0000 mg | Freq: Two times a day (BID) | INTRAMUSCULAR | Status: DC
  Administered 2016-01-23 – 2016-01-27 (×9): 20 mg via INTRAVENOUS
  Filled 2016-01-22 (×10): qty 1

## 2016-01-22 MED ORDER — BUPIVACAINE-EPINEPHRINE 0.5% -1:200000 IJ SOLN
INTRAMUSCULAR | Status: AC
  Filled 2016-01-22: qty 1

## 2016-01-22 MED ORDER — FENTANYL CITRATE (PF) 100 MCG/2ML IJ SOLN
INTRAMUSCULAR | Status: AC
  Administered 2016-01-22: 50 ug via INTRAVENOUS
  Filled 2016-01-22: qty 2

## 2016-01-22 MED ORDER — PIPERACILLIN-TAZOBACTAM 3.375 G IVPB
INTRAVENOUS | Status: AC
  Filled 2016-01-22: qty 50

## 2016-01-22 MED ORDER — PHENYLEPHRINE HCL 10 MG/ML IJ SOLN
INTRAMUSCULAR | Status: DC | PRN
Start: 2016-01-22 — End: 2016-01-22
  Administered 2016-01-22: 80 ug via INTRAVENOUS
  Administered 2016-01-22: 40 ug via INTRAVENOUS
  Administered 2016-01-22: 80 ug via INTRAVENOUS
  Administered 2016-01-22 (×2): 40 ug via INTRAVENOUS
  Administered 2016-01-22: 80 ug via INTRAVENOUS
  Administered 2016-01-22: 120 ug via INTRAVENOUS

## 2016-01-22 MED ORDER — PHENYLEPHRINE 40 MCG/ML (10ML) SYRINGE FOR IV PUSH (FOR BLOOD PRESSURE SUPPORT)
PREFILLED_SYRINGE | INTRAVENOUS | Status: AC
  Filled 2016-01-22: qty 10

## 2016-01-22 MED ORDER — FENTANYL CITRATE (PF) 100 MCG/2ML IJ SOLN
INTRAMUSCULAR | Status: AC
  Filled 2016-01-22: qty 2

## 2016-01-22 MED ORDER — PROPOFOL 10 MG/ML IV BOLUS
INTRAVENOUS | Status: DC | PRN
  Administered 2016-01-22: 150 mg via INTRAVENOUS
  Administered 2016-01-22: 50 mg via INTRAVENOUS

## 2016-01-22 SURGICAL SUPPLY — 33 items
BLADE HEX COATED 2.75 (ELECTRODE) ×3 IMPLANT
BLADE SURG 15 STRL LF DISP TIS (BLADE) ×1 IMPLANT
BLADE SURG 15 STRL SS (BLADE) ×3
CHLORAPREP W/TINT 26ML (MISCELLANEOUS) IMPLANT
COVER SURGICAL LIGHT HANDLE (MISCELLANEOUS) IMPLANT
DRAIN PENROSE 18X1/2 LTX STRL (DRAIN) ×3 IMPLANT
DRSG PAD ABDOMINAL 8X10 ST (GAUZE/BANDAGES/DRESSINGS) IMPLANT
ELECT PENCIL ROCKER SW 15FT (MISCELLANEOUS) ×3 IMPLANT
ELECT REM PT RETURN 9FT ADLT (ELECTROSURGICAL) ×3
ELECTRODE REM PT RTRN 9FT ADLT (ELECTROSURGICAL) ×1 IMPLANT
GAUZE SPONGE 4X4 12PLY STRL (GAUZE/BANDAGES/DRESSINGS) ×3 IMPLANT
GAUZE SPONGE 4X4 16PLY XRAY LF (GAUZE/BANDAGES/DRESSINGS) ×3 IMPLANT
GLOVE BIOGEL PI IND STRL 7.0 (GLOVE) IMPLANT
GLOVE BIOGEL PI INDICATOR 7.0 (GLOVE)
GLOVE SURG ORTHO 8.0 STRL STRW (GLOVE) ×3 IMPLANT
GOWN STRL REUS W/TWL LRG LVL3 (GOWN DISPOSABLE) ×3 IMPLANT
GOWN STRL REUS W/TWL XL LVL3 (GOWN DISPOSABLE) ×3 IMPLANT
KIT BASIN OR (CUSTOM PROCEDURE TRAY) ×3 IMPLANT
LUBRICANT JELLY K Y 4OZ (MISCELLANEOUS) IMPLANT
NEEDLE HYPO 25X1 1.5 SAFETY (NEEDLE) IMPLANT
PACK LITHOTOMY IV (CUSTOM PROCEDURE TRAY) ×3 IMPLANT
PACKING VAGINAL (PACKING) ×3 IMPLANT
PAD ABD 8X10 STRL (GAUZE/BANDAGES/DRESSINGS) ×6 IMPLANT
SOL PREP PROV IODINE SCRUB 4OZ (MISCELLANEOUS) ×3 IMPLANT
SPONGE LAP 18X18 X RAY DECT (DISPOSABLE) ×3 IMPLANT
SUT ETHILON 2 0 PS N (SUTURE) ×3 IMPLANT
SWAB COLLECTION DEVICE MRSA (MISCELLANEOUS) IMPLANT
SYR BULB IRRIGATION 50ML (SYRINGE) ×3 IMPLANT
SYR CONTROL 10ML LL (SYRINGE) IMPLANT
TAPE CLOTH SURG 4X10 WHT LF (GAUZE/BANDAGES/DRESSINGS) ×3 IMPLANT
TOWEL OR 17X26 10 PK STRL BLUE (TOWEL DISPOSABLE) ×3 IMPLANT
UNDERPAD 30X30 INCONTINENT (UNDERPADS AND DIAPERS) ×3 IMPLANT
YANKAUER SUCT BULB TIP 10FT TU (MISCELLANEOUS) ×3 IMPLANT

## 2016-01-22 NOTE — Anesthesia Preprocedure Evaluation (Signed)
Anesthesia Evaluation  Patient identified by MRN, date of birth, ID band Patient awake    Reviewed: Allergy & Precautions, NPO status , Patient's Chart, lab work & pertinent test results  History of Anesthesia Complications (+) PONV, Family history of anesthesia reaction and history of anesthetic complications  Airway Mallampati: II  TM Distance: >3 FB Neck ROM: Full    Dental no notable dental hx.    Pulmonary neg pulmonary ROS,    Pulmonary exam normal breath sounds clear to auscultation       Cardiovascular negative cardio ROS Normal cardiovascular exam Rhythm:Regular Rate:Normal     Neuro/Psych PSYCHIATRIC DISORDERS Depression negative neurological ROS     GI/Hepatic Neg liver ROS, PUD,   Endo/Other  negative endocrine ROS  Renal/GU negative Renal ROS  negative genitourinary   Musculoskeletal negative musculoskeletal ROS (+)   Abdominal   Peds negative pediatric ROS (+)  Hematology  (+) anemia ,   Anesthesia Other Findings   Reproductive/Obstetrics negative OB ROS                             Anesthesia Physical Anesthesia Plan  ASA: II  Anesthesia Plan: General   Post-op Pain Management:    Induction: Intravenous  Airway Management Planned: Oral ETT  Additional Equipment:   Intra-op Plan:   Post-operative Plan: Extubation in OR  Informed Consent: I have reviewed the patients History and Physical, chart, labs and discussed the procedure including the risks, benefits and alternatives for the proposed anesthesia with the patient or authorized representative who has indicated his/her understanding and acceptance.   Dental advisory given  Plan Discussed with: CRNA  Anesthesia Plan Comments:         Anesthesia Quick Evaluation

## 2016-01-22 NOTE — Transfer of Care (Signed)
Immediate Anesthesia Transfer of Care Note  Patient: Sara Barnes  Procedure(s) Performed: Procedure(s): IRRIGATION AND DEBRIDEMENT PERIRECTAL ABSCESS (N/A)  Patient Location: PACU  Anesthesia Type:General  Level of Consciousness: Patient easily awoken, sedated, comfortable, cooperative, following commands, responds to stimulation.   Airway & Oxygen Therapy: Patient spontaneously breathing, ventilating well, oxygen via simple oxygen mask.  Post-op Assessment: Report given to PACU RN, vital signs reviewed and stable, moving all extremities.   Post vital signs: Reviewed and stable.  Complications: No apparent anesthesia complications  Last Vitals:  Vitals:   01/22/16 1155 01/22/16 1221  BP: 100/65 113/66  Pulse: 91 91  Resp: 17   Temp: 37.4 C 37.6 C    Last Pain:  Vitals:   01/22/16 1221  TempSrc:   PainSc: 9       Patients Stated Pain Goal: 1 (79/49/97 1820)  Complications: No apparent anesthesia complications

## 2016-01-22 NOTE — H&P (View-Only) (Signed)
Reason for Consult: Perirectal abscess and fistula Referring Physician: Dr. Zenovia Jarred   Sara Barnes is an 75 y.o. female.  HPI:  75 year old   Female admitted 01/07/16 with abdominal pain. She has a history of significant ulcerative colitis versus Crohn's disease. She was previously hospitalized 8/28- 12/29/15 with a similar problem. During that hospitalization C. difficile was ruled out CMV was also negative. There is no evidence of perianal abscess or fissure at that time. On readmission at this point she was started on IV Solu-Medrol and Remicade. Flexible sigmoidoscopy on 01/08/16, again showed no evidence of perirectal or perianal abscess or fistula. There was severe inflammation characterized by congestion, friability ostomy vascularity and deep ulceration was found from the rectum to the sigmoid colon there were many large deep ulcers noted in the rectum. It was Dr. Doyne Keel opinion that this was more consistent with Crohn's than a typical ulcerative colitis. Pathology:Colon, biopsy, rectosigmoid ACTIVE EROSIVE ULCER, CONSISTENT WITH CHRONIC ACTIVE ULCERATIVE COLITIS. NO EVIDENCE OF CMV OR DYSPLASIA. She was started on Remicade 01/09/16. She was also continued on IV steroids. He was initially felt that she was improving with this treatment. She continued to have significant discomfort and rectal bleeding by 9/21 she was having a large stools containing large amounts of clotted blood. On 01/21/16 hemoglobin was down to 7.6 hematocrit 23 she was transfused 2 units of packed cells. Repeat CT scan was obtained last evening. This shows a large U-shaped perianal abscess measuring 6 x 4.6 cm from the 3 to 9:00 positions containing air-fluid there is a fistula at 6:00 with additional fat stranding and inflammatory changes going into the left gluteal fold and the left labia major with air. We are asked to see.   Past Medical History:  Diagnosis Date  . Basal cell carcinoma   . 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 Complication of anesthesia    anxiety post-op after ear surgeries  . Dental crowns present    recent root canal 11/2014  . Family history of adverse reaction to anesthesia    pt's sister has hx. of post-op N/V  . Indigestion   . PONV (postoperative nausea and vomiting)   . Runny nose 12/07/2014   clear drainage, per pt.  . Seasonal allergies   . Ulcerative colitis   . Vitamin B12 deficiency     Past Surgical History:  Procedure Laterality Date  . ABDOMINAL HYSTERECTOMY     partial  . APPENDECTOMY    . BASAL CELL CARCINOMA EXCISION Left    nose  . BREAST ENHANCEMENT SURGERY    . BREAST LUMPECTOMY Left    age 60's  . BREAST LUMPECTOMY WITH RADIOACTIVE SEED AND SENTINEL LYMPH NODE BIOPSY Left 12/13/2014   Procedure: LEFT BREAST LUMPECTOMY WITH RADIOACTIVE SEED AND SENTINEL LYMPH NODE BIOPSY;  Surgeon: [name], MD;  Location: [facility];  Service: General;  Laterality: Left;  . COLONOSCOPY WITH PROPOFOL  05/28/2011; 06/28/2014  . FLEXIBLE SIGMOIDOSCOPY N/A 01/08/2016   Procedure: FLEXIBLE SIGMOIDOSCOPY;  Surgeon: [name], MD;  Location: [facility];  Service: Gastroenterology;  Laterality: N/A;  needs MAC due to pain and anxiety  . INCISION AND DRAINAGE PERIRECTAL ABSCESS N/A 09/28/2013   Procedure: IRRIGATION AND DEBRIDEMENT PERIANAL ABSCESS, proctoscopy;  Surgeon: Shann Medal, MD;  Location: [facility];  Service: General;  Laterality: N/A;  . INNER EAR SURGERY Bilateral     Family History  Problem Relation Age of Onset  .  Prostate cancer Brother 59  . Kidney disease Brother   . Diabetes Brother   . Brain cancer Father   . Rheum arthritis Mother   . Anesthesia problems Sister     post-op N/V  . Emphysema Maternal Grandfather   . Asthma Sister   . Rheum arthritis Brother   . Colon cancer Neg Hx   . Esophageal cancer Neg Hx   . Breast cancer Neg Hx     Social History:  reports that she has never  smoked. She has never used smokeless tobacco. She reports that she drinks alcohol. She reports that she does not use drugs.  Allergies:  Allergies  Allergen Reactions  . Codeine Nausea And Vomiting  . Mesalamine Nausea And Vomiting  . Other Itching    PERFUMED LOTIONS  Cant have MRI due to ear implant   . Oxycodone-Acetaminophen Rash    Itching & rash Tolerates Dilaudid  . Sulfa Antibiotics Rash    Medications:  Prior to Admission:  Prescriptions Prior to Admission  Medication Sig Dispense Refill Last Dose  . acetaminophen (TYLENOL) 500 MG tablet Take 1,000 mg by mouth every 6 (six) hours as needed for mild pain, moderate pain, fever or headache.   01/07/2016 at 0800  . acetaminophen (TYLENOL) 650 MG CR tablet Take 650 mg by mouth every 8 (eight) hours as needed for pain.   01/07/2016 at 0300  . cyanocobalamin (,VITAMIN B-12,) 1000 MCG/ML injection INJECT 1 ML (1,000 MCG TOTAL) INTO THE MUSCLE EVERY 30 (THIRTY) DAYS. 12 mL 0 11/2015  . FLUoxetine (PROZAC) 20 MG capsule Take 1 capsule (20 mg total) by mouth daily. 90 capsule 1 01/06/2016 at Unknown time  . fluticasone (FLONASE) 50 MCG/ACT nasal spray Place 2 sprays into both nostrils daily. (Patient taking differently: Place 2 sprays into both nostrils daily as needed for allergies or rhinitis. ) 16 g 6 Past Month at Unknown time  . HYDROcodone-acetaminophen (NORCO/VICODIN) 5-325 MG tablet Take 2 tablets by mouth every 4 (four) hours as needed. (Patient taking differently: Take 2 tablets by mouth every 4 (four) hours as needed for moderate pain. ) 30 tablet 0 01/06/2016 at Unknown time  . hydrocortisone (ANUSOL-HC) 25 MG suppository Place 1 suppository (25 mg total) rectally daily. 12 suppository 0 Past Week at Unknown time  . mesalamine (CANASA) 1000 MG suppository Place 1 suppository (1,000 mg total) rectally at bedtime. 30 suppository 12 Past Week at Unknown time  . ondansetron (ZOFRAN) 4 MG tablet Take 1 tablet (4 mg total) by mouth every  8 (eight) hours as needed for nausea or vomiting. 11 tablet 0 01/06/2016 at Unknown time  . potassium chloride 20 MEQ TBCR Take 20 mEq by mouth daily. 20 tablet 0 01/06/2016 at Unknown time  . [EXPIRED] predniSONE (DELTASONE) 20 MG tablet Take 2 tablets (40 mg total) by mouth daily with breakfast. (Patient taking differently: Take 40 mg by mouth daily with breakfast. Started 09/02 for 18 days) 36 tablet 0 01/06/2016 at Unknown time  . zolpidem (AMBIEN) 5 MG tablet Take 1 tablet (5 mg total) by mouth at bedtime as needed. (Patient taking differently: Take 5 mg by mouth at bedtime. ) 30 tablet 2 01/06/2016 at Unknown time  . belladonna-PHENObarbital (DONNATAL) 16.2 MG/5ML ELIX Take 5 mLs (16.2 mg total) by mouth 3 (three) times daily. (Patient not taking: Reported on 01/07/2016) 600 mL 0 Not Taking at Unknown time   Scheduled: . chlorhexidine  15 mL Mouth/Throat BID  . ciprofloxacin  400 mg Intravenous  Q12H  . dicyclomine  10 mg Oral TID AC & HS  . enoxaparin (LOVENOX) injection  40 mg Subcutaneous Q24H  . FLUoxetine  20 mg Oral Daily  . hydrocortisone  25 mg Rectal Q0600  . methylPREDNISolone (SOLU-MEDROL) injection  30 mg Intravenous Q12H  . metronidazole  500 mg Intravenous Q6H  . oxymorphone  20 mg Oral QAC breakfast  . oxymorphone  30 mg Oral QHS   Continuous:  TZG:[id number], HYDROcodone-acetaminophen, HYDROmorphone (DILAUDID) injection, ondansetron, zolpidem Anti-infectives    Start     Dose/Rate Route Frequency Ordered Stop   01/21/16 1830  ciprofloxacin (CIPRO) IVPB 400 mg     400 mg 200 mL/hr over 60 Minutes Intravenous Every 12 hours 01/21/16 1744     01/21/16 1830  metroNIDAZOLE (FLAGYL) IVPB 500 mg     500 mg 100 mL/hr over 60 Minutes Intravenous Every 6 hours 01/21/16 1744        Results for orders placed or performed during the hospital encounter of 01/07/16 (from the past 48 hour(s))  CBC     Status: Abnormal   Collection Time: 01/21/16  5:22 AM  Result Value Ref Range    WBC 14.2 (H) 4.0 - 10.5 K/uL   RBC 2.46 (L) 3.87 - 5.11 MIL/uL   Hemoglobin 7.6 (L) 12.0 - 15.0 g/dL   HCT 23.1 (L) 36.0 - 46.0 %   MCV 93.9 78.0 - 100.0 fL   MCH 30.9 26.0 - 34.0 pg   MCHC 32.9 30.0 - 36.0 g/dL   RDW 13.5 11.5 - 15.5 %   Platelets 238 150 - 400 K/uL  C-reactive protein     Status: Abnormal   Collection Time: 01/21/16  5:22 AM  Result Value Ref Range   CRP 14.1 (H) <1.0 mg/dL    Comment: Performed at [facility] metabolic panel     Status: Abnormal   Collection Time: 01/21/16  5:22 AM  Result Value Ref Range   Sodium 134 (L) 135 - 145 mmol/L   Potassium 3.8 3.5 - 5.1 mmol/L   Chloride 102 101 - 111 mmol/L   CO2 25 22 - 32 mmol/L   Glucose, Bld 94 65 - 99 mg/dL   BUN 23 (H) 6 - 20 mg/dL   Creatinine, Ser 0.84 0.44 - 1.00 mg/dL   Calcium 8.5 (L) 8.9 - 10.3 mg/dL   GFR calc non Af Amer >60 >60 mL/min   GFR calc Af Amer >60 >60 mL/min    Comment: (NOTE) The eGFR has been calculated using the CKD EPI equation. This calculation has not been validated in all clinical situations. eGFR's persistently <60 mL/min signify possible Chronic Kidney Disease.    Anion gap 7 5 - 15  Type and screen [place]     Status: None   Collection Time: 01/21/16  8:16 AM  Result Value Ref Range   ABO/RH(D) A NEG    Antibody Screen NEG    Sample Expiration 01/24/2016    Unit Number [id number]    Blood Component Type RED CELLS,LR    Unit division 00    Status of Unit ISSUED,FINAL    Transfusion Status OK TO TRANSFUSE    Crossmatch Result Compatible    Unit Number [id number]    Blood Component Type RBC LR PHER1    Unit division 00    Status of Unit ISSUED,FINAL    Transfusion Status OK TO TRANSFUSE    Crossmatch Result Compatible   Prepare RBC  Status: None   Collection Time: 01/21/16  8:22 AM  Result Value Ref Range   Order Confirmation OK TO TRANSFUSE   C difficile quick scan w PCR reflex     Status: None   Collection Time:  01/21/16  4:43 PM  Result Value Ref Range   C Diff antigen NEGATIVE NEGATIVE   C Diff toxin NEGATIVE NEGATIVE   C Diff interpretation No C. difficile detected.   Hemoglobin and hematocrit, blood     Status: Abnormal   Collection Time: 01/21/16  7:07 PM  Result Value Ref Range   Hemoglobin 10.0 (L) 12.0 - 15.0 g/dL    Comment: POST TRANSFUSION SPECIMEN   HCT 29.9 (L) 36.0 - 46.0 %  CBC with Differential     Status: Abnormal   Collection Time: 01/22/16  5:39 AM  Result Value Ref Range   WBC 11.8 (H) 4.0 - 10.5 K/uL   RBC 2.81 (L) 3.87 - 5.11 MIL/uL   Hemoglobin 8.6 (L) 12.0 - 15.0 g/dL   HCT 25.6 (L) 36.0 - 46.0 %   MCV 91.1 78.0 - 100.0 fL   MCH 30.6 26.0 - 34.0 pg   MCHC 33.6 30.0 - 36.0 g/dL   RDW 14.6 11.5 - 15.5 %   Platelets 165 150 - 400 K/uL   Neutrophils Relative % 91 %   Lymphocytes Relative 2 %   Monocytes Relative 7 %   Eosinophils Relative 0 %   Basophils Relative 0 %   Neutro Abs 10.8 (H) 1.7 - 7.7 K/uL   Lymphs Abs 0.2 (L) 0.7 - 4.0 K/uL   Monocytes Absolute 0.8 0.1 - 1.0 K/uL   Eosinophils Absolute 0.0 0.0 - 0.7 K/uL   Basophils Absolute 0.0 0.0 - 0.1 K/uL   WBC Morphology DOHLE BODIES   Comprehensive metabolic panel Once     Status: Abnormal   Collection Time: 01/22/16  5:39 AM  Result Value Ref Range   Sodium 138 135 - 145 mmol/L   Potassium 4.1 3.5 - 5.1 mmol/L   Chloride 107 101 - 111 mmol/L   CO2 26 22 - 32 mmol/L   Glucose, Bld 105 (H) 65 - 99 mg/dL   BUN 16 6 - 20 mg/dL   Creatinine, Ser 0.57 0.44 - 1.00 mg/dL   Calcium 8.2 (L) 8.9 - 10.3 mg/dL   Total Protein 4.6 (L) 6.5 - 8.1 g/dL   Albumin 2.0 (L) 3.5 - 5.0 g/dL   AST 12 (L) 15 - 41 U/L   ALT 14 14 - 54 U/L   Alkaline Phosphatase 69 38 - 126 U/L   Total Bilirubin 0.4 0.3 - 1.2 mg/dL   GFR calc non Af Amer >60 >60 mL/min   GFR calc Af Amer >60 >60 mL/min    Comment: (NOTE) The eGFR has been calculated using the CKD EPI equation. This calculation has not been validated in all clinical  situations. eGFR's persistently <60 mL/min signify possible Chronic Kidney Disease.    Anion gap 5 5 - 15    Ct Abdomen Pelvis W Contrast  Result Date: 01/21/2016 CLINICAL DATA:  75 y/o F; history of inflammatory bowel disease, believed to be ulcerative colitis, presenting with severe abdominal pain for several days. EXAM: CT ABDOMEN AND PELVIS WITH CONTRAST TECHNIQUE: Multidetector CT imaging of the abdomen and pelvis was performed using the standard protocol following bolus administration of intravenous contrast. CONTRAST:  143m ISOVUE-300 IOPAMIDOL (ISOVUE-300) INJECTION 61% COMPARISON:  01/01/2016 CT of abdomen and pelvis. FINDINGS: Lower chest:  Bilateral breast implants. Normal heart size. No pericardial effusion. Clear lung bases. Interval resolution of pleural effusion. Hepatobiliary: No focal liver abnormality is seen. No gallstones, gallbladder wall thickening, or biliary dilatation. Pancreas: Unremarkable. No pancreatic ductal dilatation or surrounding inflammatory changes. Spleen: Normal in size without focal abnormality. Adrenals/Urinary Tract: Adrenal glands are unremarkable. Kidneys are normal, without renal calculi, focal lesion, or hydronephrosis. Severely distended bladder. Stomach/Bowel: Small hiatal hernia. No evidence for bowel obstruction. Further interval improvement inflammatory changes of the colon with mild residual mid descending colon. Small bowel is unremarkable. There is a large a U shaped perianal abscess measuring 64 x 46 mm axially extending from the 3 to 9 o'clock containing air and fluid. 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 Additionally there is fat stranding and inflammatory changes throughout the age she anal fat extending into left lower gluteal fold and left labia majora with air. Vascular/Lymphatic: Aortic atherosclerosis. No enlarged abdominal or pelvic lymph nodes. Reproductive: Status post hysterectomy. No adnexal masses.  Other: Choose Musculoskeletal: No acute or significant osseous findings. IMPRESSION: 1. Growing perianal abscess and fistula. Inflammatory changes extend throughout bilateral ischioanal fat, the left lower gluteal fold, and into the left labia majora. There are multiple foci of air concerning for gas-forming organism. 2. Further improvement in inflammatory changes of the colon with mild residual in the descending colon. These results will be called to the ordering clinician or representative by the Radiologist Assistant, and communication documented in the PACS or zVision Dashboard. Electronically Signed   By: Kristine Garbe M.D.   On: 01/21/2016 16:58    Review of Systems  Constitutional: Positive for chills.  HENT: Positive for hearing loss (she is very hard of hearing).   Eyes: Negative.   Respiratory: Negative.   Cardiovascular: Negative.   Gastrointestinal: Positive for abdominal pain.       Pt pain is in the rectal area but says it goes to her pelvis.    Genitourinary: Negative.   Musculoskeletal: Negative.   Skin: Negative.        Whole perirectal site is so tender and swollen she cannot lie flat.  Neurological: Positive for weakness.  Endo/Heme/Allergies: Bruises/bleeds easily (Rectal bleeding with transfusion).  Psychiatric/Behavioral: Positive for depression.   Blood pressure 97/61, pulse (!) 111, temperature 98.4 F (36.9 C), temperature source Oral, resp. rate 19, height 5' 6"  (1.676 m), weight 72.6 kg (160 lb), SpO2 96 %. Physical Exam  Constitutional:  This is a chronically ill-appearing woman. She appears pale and extremely fragile.  HENT:  Head: Normocephalic and atraumatic.  Eyes: Right eye exhibits no discharge. Left eye exhibits no discharge. No scleral icterus.  Neck: Normal range of motion. Neck supple. No JVD present. No tracheal deviation present. No thyromegaly present.  Cardiovascular: Normal rate, regular rhythm, normal heart sounds and intact distal  pulses.   No murmur heard. Respiratory: Effort normal and breath sounds normal. No respiratory distress. She has no wheezes. She has no rales. She exhibits no tenderness.  GI: Soft. She exhibits no distension and no mass. There is no tenderness. There is no rebound and no guarding.  Genitourinary:  Genitourinary Comments: Both the left and the right buttocks are tender and swollen. The left side is more tender in the swelling extends up to the labia major. I cannot examine her rectum because of the tenderness and swelling. There is a good deal of clotted blood or fecal matter at the entrance of the rectum.  She cannot lie flat secondary to the pain and discomfort.  Musculoskeletal: She exhibits no edema or tenderness.  Lymphadenopathy:    She has no cervical adenopathy.  Neurological: She is alert.  She is alert, and oriented but her mentation is extremely slow. He is also extremely hard of hearing. She also feels just terrible all over, all which makes her mentation slower.  Skin: Skin is dry. No rash noted. No erythema. No pallor.  Pale,   Psychiatric: She has a normal mood and affect. Her behavior is normal. Judgment and thought content normal.    Assessment/Plan: Perirectal abscess and fistula Ulcerative colitis/Crohn's disease treated with IV steroids and Remicade acutely Rectal bleeding with anemia Hypotension/tachycardia Depression/anxiety  Plan: Based on exam will plan to take her to the operating room for exam under anesthesia, incision and drainage of perirectal abscess and fistula. I will change her antibiotics to IV Zosyn and start IV fluids be sure she has a type and screen for possible transfusion. We've asked her to contact her son so he can be present and assist with decision-making.  Angeligue Bowne 01/22/2016, 7:47 AM

## 2016-01-22 NOTE — Progress Notes (Signed)
Progress Note   Subjective  Chief Complaint: UC-rectal involvement  This morning the patient is found laying in bed, she tells me that she had another "small fall" last night, but upon relaying history seems to confuse last night with the night before. She has had two large BM with watery diarrhea and blood clots which she could not control, one last night and again this morning. She is aware of CT findings of rectal abscess and plans for surgery today for I&D and exploration. She also asks that I make sure her son knows the plan.    Objective   Vital signs in last 24 hours: Temp:  [98 F (36.7 C)-98.8 F (37.1 C)] 98.7 F (37.1 C) (09/26 0832) Pulse Rate:  [61-111] 106 (09/26 0832) Resp:  [16-20] 18 (09/26 0832) BP: (74-127)/(44-78) 101/55 (09/26 0832) SpO2:  [92 %-99 %] 93 % (09/26 0832) Last BM Date: 01/21/16 General:  Alert &oriented x3 Heart:  Regular rate and rhythm; no murmurs Lungs: Respirations even and unlabored, lungs CTA bilaterally Abdomen:  Soft, mild ttp b/l lower quadrants and nondistended. Normal bowel sounds. Extremities:  Without edema. Neurologic:  Alert and oriented,  grossly normal neurologically. Psych:  Cooperative. Normal mood and affect.  Intake/Output from previous day: 09/25 0701 - 09/26 0700 In: 1080 [P.O.:120; Blood:660; IV Piggyback:300] Out: 6 [Urine:1; Stool:5]  Lab Results:  Recent Labs  01/20/16 0548 01/21/16 0522 01/21/16 1907 01/22/16 0539  WBC 13.2* 14.2*  --  11.8*  HGB 8.2* 7.6* 10.0* 8.6*  HCT 24.9* 23.1* 29.9* 25.6*  PLT 252 238  --  165   BMET  Recent Labs  01/21/16 0522 01/22/16 0539  NA 134* 138  K 3.8 4.1  CL 102 107  CO2 25 26  GLUCOSE 94 105*  BUN 23* 16  CREATININE 0.84 0.57  CALCIUM 8.5* 8.2*   LFT  Recent Labs  01/22/16 0539  PROT 4.6*  ALBUMIN 2.0*  AST 12*  ALT 14  ALKPHOS 69  BILITOT 0.4   PT/INR No results for input(s): LABPROT, INR in the last 72 hours.  Studies/Results: Ct  Abdomen Pelvis W Contrast  Result Date: 01/21/2016 CLINICAL DATA:  75 y/o F; history of inflammatory bowel disease, believed to be ulcerative colitis, presenting with severe abdominal pain for several days. EXAM: CT ABDOMEN AND PELVIS WITH CONTRAST TECHNIQUE: Multidetector CT imaging of the abdomen and pelvis was performed using the standard protocol following bolus administration of intravenous contrast. CONTRAST:  158m ISOVUE-300 IOPAMIDOL (ISOVUE-300) INJECTION 61% COMPARISON:  01/01/2016 CT of abdomen and pelvis. FINDINGS: Lower chest: Bilateral breast implants. Normal heart size. No pericardial effusion. Clear lung bases. Interval resolution of pleural effusion. Hepatobiliary: No focal liver abnormality is seen. No gallstones, gallbladder wall thickening, or biliary dilatation. Pancreas: Unremarkable. No pancreatic ductal dilatation or surrounding inflammatory changes. Spleen: Normal in size without focal abnormality. Adrenals/Urinary Tract: Adrenal glands are unremarkable. Kidneys are normal, without renal calculi, focal lesion, or hydronephrosis. Severely distended bladder. Stomach/Bowel: Small hiatal hernia. No evidence for bowel obstruction. Further interval improvement inflammatory changes of the colon with mild residual mid descending colon. Small bowel is unremarkable. There is a large a U shaped perianal abscess measuring 64 x 46 mm axially extending from the 3 to 9 o'clock containing air and fluid. 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 Additionally there is fat stranding and inflammatory changes throughout the age she anal fat extending into left lower gluteal fold and left  labia majora with air. Vascular/Lymphatic: Aortic atherosclerosis. No enlarged abdominal or pelvic lymph nodes. Reproductive: Status post hysterectomy. No adnexal masses. Other: Choose Musculoskeletal: No acute or significant osseous findings. IMPRESSION: 1. Growing perianal abscess and  fistula. Inflammatory changes extend throughout bilateral ischioanal fat, the left lower gluteal fold, and into the left labia majora. There are multiple foci of air concerning for gas-forming organism. 2. Further improvement in inflammatory changes of the colon with mild residual in the descending colon. These results will be called to the ordering clinician or representative by the Radiologist Assistant, and communication documented in the PACS or zVision Dashboard. Electronically Signed   By: Kristine Garbe M.D.   On: 01/21/2016 16:58       Assessment / Plan:   Assessment: 1. 75 y.o.femalewith indeterminate colitis, severe rectal ulcers-see Dr. Vena Rua addendum to 01/21/16 progress note for further more thorough details on hx-Ct yesterday as above with large rectal abscess, pt seen by surgery this am with plans for exploration and I&D around 10:30/11 am 2. Anemia: pt received 2 u prbcs 9/25- hospitalist ordering 1 further unit today as hgb dropped from 10.0 to 8.6 overnight  Plan: 1. Flex sig has been cancelled for today due to findings from CT, also Remicade redosing will wait until appropriate time next Tuesday as Ct actually shows improvement in UC in other parts of colon 2. Sx has seen pt this morning, appreciate their recommendations 3. Confirmed with son the plan for today, he was already aware as surgery had just gotten off the phone with him 4. Continue current meds-in the future will need to decrease pain meds as likely a lot of the rectal pressure and pain will be relieved after surgery and she will not require such high doses 5. C-diff pending 6. Agree with blood transfusion as necessary 7. Will discuss above with Dr. Hilarie Fredrickson, please await any further recs  Thank you for your kind consultation, we will continue to follow.   LOS: 14 days   Lavone Nian [facility]  01/22/2016, 9:30 AM  Pager # [phone]

## 2016-01-22 NOTE — Progress Notes (Signed)
2 loose bloody bowl movements throughout night, last being 0600

## 2016-01-22 NOTE — Progress Notes (Signed)
PROGRESS NOTE    Sara Barnes  GHW:[id number] DOB: 1941-03-05 DOA: 01/07/2016 PCP: Ann Held, DO   Chief Complaint  Patient presents with  . Rectal Bleeding    SINCE FRIDAY     Brief Narrative:  HPI on 01/07/2016 by Dr. Gilman Schmidt [name] a 75 y.o.femalewith medical history significant of ulcerative colitis versus Crohn's disease, believed to be UC eventually,otherwise healthy, presents to the emergency room with chief complaint of severe abdominal pain for the past several days. She was recently hospitalized and discharged home about 9 days prior to admission. She was here in the hospital with similar complaints, placed on steroids, and tells me today that she improved some and really wanted to go home. At home, she has had ongoing symptoms, never really well achieved full remission, and over the last few days she has felt extremely unwell. She has crampy abdominal pain, nausea, poor appetite, barely able to keep down some Gatorade, and has been having significant amounts of bloody bowel movements as well. She has no fever or chills. She has no chest pain or shortness of breath. She denies any palpitations. She has no lightheadedness or dizziness. She feels generalized weakness. In the emergency room her vital signs are stable, her blood work is essentially unremarkable. Gastroenterology was consulted by EDP and have evaluated patient, and TRH was asked for admission  Interim history Gastroenterology consultation appreciated. Patient underwent flexible sigmoidoscopy. CMV was negative and she was started on Remicade. She was initially doing better and tolerating some diet, GI following and since still having a lot of pain and frequent BMs will be dosed with another Remicaide infusion on  9/17. 9/21 pain and bloody clots worsened last night.  CT abd/pelvis showed perirectal abscess- General surgery consulted. Plan for I&D on 9/26.  Assessment & Plan   Abdominal pain  secondary to Ulcerative colitis flare with rectal bleeding/ Perianal abscess -Gastroenterology consulted and appreciated -s/p flexible sigmoidoscopy on 01/08/16, biopsy Showed active erosive ulcer. No CMV found. Patient was started on Remicade on 01/09/2016 and 01/13/16 -GI increased Opana to 77m BID.  -Continue to monitor CBC -Continue solumedrol -CRP increasing -GI recommended possibly redosing remicaide and obtaining CT abd/pelvis. May need transfer to [facility]  -CT abd/pelvis: Growing perianal abscess and fistula, concern for gas-forming organism -Patient started on ciprofloxacin and Flagyl -Gen. surgery consulted and appreciated, plan for I&D today  Acute blood loss anemia/Symptomatic anemia -Secondary to the above -Patient does feel very weak -hemoglobin 8.6, after 2uPRBCs -Will order additional 2u for transfusion today  Diarrhea secondary to UC flare -Cdiff negative x2 -Continue supportive care  Depression/Anxiety -Continue prozac   DVT Prophylaxis  Lovenox  Code Status: Full  Family Communication: Son at bedside  Disposition Plan: Admitted. Plan for I&D today by general surgery due to perianal abscess.  Consultants Gastroenterology  General surgery  Procedures  Flexible sigmoidoscopy  Antibiotics   Anti-infectives    Start     Dose/Rate Route Frequency Ordered Stop   01/22/16 0900  piperacillin-tazobactam (ZOSYN) IVPB 3.375 g     3.375 g 12.5 mL/hr over 240 Minutes Intravenous Every 8 hours 01/22/16 0840     01/21/16 1830  ciprofloxacin (CIPRO) IVPB 400 mg  Status:  Discontinued     400 mg 200 mL/hr over 60 Minutes Intravenous Every 12 hours 01/21/16 1744 01/22/16 0840   01/21/16 1830  metroNIDAZOLE (FLAGYL) IVPB 500 mg  Status:  Discontinued     500 mg 100 mL/hr over 60  Minutes Intravenous Every 6 hours 01/21/16 1744 01/22/16 0840      Subjective:   Sara Barnes seen and examined today.  Continues to have bloody stools and rectal pain/cramping.    Admits to feeling very weak, confused, and falling.  Continues to have pain, now in the pelvic region.  Denies chest pain, shortness of breath, dizziness, headache.   Objective:   Vitals:   01/22/16 0642 01/22/16 0832 01/22/16 1025 01/22/16 1049  BP: 97/61 (!) 101/55 94/77 106/60  Pulse: (!) 111 (!) 106 95 92  Resp: 19 18 16 16   Temp: 98.4 F (36.9 C) 98.7 F (37.1 C) 99.3 F (37.4 C) 98.2 F (36.8 C)  TempSrc: Oral Oral Oral Oral  SpO2: 96% 93% 100% 100%  Weight:      Height:        Intake/Output Summary (Last 24 hours) at 01/22/16 1058 Last data filed at 01/22/16 0926  Gross per 24 hour  Intake              960 ml  Output                5 ml  Net              955 ml   Filed Weights   01/07/16 1136  Weight: 72.6 kg (160 lb)    Exam  General: Well developed, well nourished, no apparent distress  HEENT: NCAT, mucous membranes moist.   Cardiovascular: S1 S2 auscultated,  RRR, no murmurs  Respiratory: Clear to auscultation bilaterally with equal chest rise  Abdomen: Soft, mildly TTP lower abdomen, nondistended, + bowel sounds  Extremities: warm dry without cyanosis clubbing or edema  Neuro: AAOx3, nonfocal  Psych: Appropriate mood and affect, mildly anxious   Data Reviewed: I have personally reviewed following labs and imaging studies  CBC:  Recent Labs Lab 01/19/16 0544  01/20/16 0053 01/20/16 0548 01/21/16 0522 01/21/16 1907 01/22/16 0539  WBC 11.2*  --   --  13.2* 14.2*  --  11.8*  NEUTROABS  --   --   --   --   --   --  10.8*  HGB 9.9*  < > 8.2* 8.2* 7.6* 10.0* 8.6*  HCT 30.5*  < > 25.0* 24.9* 23.1* 29.9* 25.6*  MCV 92.4  --   --  91.9 93.9  --  91.1  PLT 288  --   --  252 238  --  165  < > = values in this interval not displayed. Basic Metabolic Panel:  Recent Labs Lab 01/19/16 0544 01/21/16 0522 01/22/16 0539  NA 134* 134* 138  K 4.2 3.8 4.1  CL 101 102 107  CO2 25 25 26   GLUCOSE 114* 94 105*  BUN 16 23* 16  CREATININE 0.60 0.84  0.57  CALCIUM 8.5* 8.5* 8.2*   GFR: Estimated Creatinine Clearance: 62.9 mL/min (by C-G formula based on SCr of 0.57 mg/dL). Liver Function Tests:  Recent Labs Lab 01/22/16 0539  AST 12*  ALT 14  ALKPHOS 69  BILITOT 0.4  PROT 4.6*  ALBUMIN 2.0*   No results for input(s): LIPASE, AMYLASE in the last 168 hours. No results for input(s): AMMONIA in the last 168 hours. Coagulation Profile: No results for input(s): INR, PROTIME in the last 168 hours. Cardiac Enzymes: No results for input(s): CKTOTAL, CKMB, CKMBINDEX, TROPONINI in the last 168 hours. BNP (last 3 results) No results for input(s): PROBNP in the last 8760 hours. HbA1C: No results for input(s): HGBA1C  in the last 72 hours. CBG: No results for input(s): GLUCAP in the last 168 hours. Lipid Profile: No results for input(s): CHOL, HDL, LDLCALC, TRIG, CHOLHDL, LDLDIRECT in the last 72 hours. Thyroid Function Tests: No results for input(s): TSH, T4TOTAL, FREET4, T3FREE, THYROIDAB in the last 72 hours. Anemia Panel: No results for input(s): VITAMINB12, FOLATE, FERRITIN, TIBC, IRON, RETICCTPCT in the last 72 hours. Urine analysis:    Component Value Date/Time   COLORURINE YELLOW 04/19/2015 0838   APPEARANCEUR CLOUDY (A) 04/19/2015 0838   LABSPEC 1.013 04/19/2015 0838   PHURINE 6.0 04/19/2015 0838   GLUCOSEU NEGATIVE 04/19/2015 0838   HGBUR MODERATE (A) 04/19/2015 0838   HGBUR small 12/13/2009 0904   BILIRUBINUR NEGATIVE 04/19/2015 0838   BILIRUBINUR ne 01/30/2015 1543   KETONESUR 40 (A) 04/19/2015 0838   PROTEINUR NEGATIVE 04/19/2015 0838   UROBILINOGEN 0.2 01/30/2015 1543   UROBILINOGEN 0.2 09/25/2013 1350   NITRITE NEGATIVE 04/19/2015 0838   LEUKOCYTESUR TRACE (A) 04/19/2015 0838   Sepsis Labs: @LABRCNTIP (procalcitonin:4,lacticidven:4)  ) Recent Results (from the past 240 hour(s))  C difficile quick scan w PCR reflex     Status: None   Collection Time: 01/21/16  4:43 PM  Result Value Ref Range Status   C  Diff antigen NEGATIVE NEGATIVE Final   C Diff toxin NEGATIVE NEGATIVE Final   C Diff interpretation No C. difficile detected.  Final      Radiology Studies: Ct Abdomen Pelvis W Contrast  Result Date: 01/21/2016 CLINICAL DATA:  75 y/o F; history of inflammatory bowel disease, believed to be ulcerative colitis, presenting with severe abdominal pain for several days. EXAM: CT ABDOMEN AND PELVIS WITH CONTRAST TECHNIQUE: Multidetector CT imaging of the abdomen and pelvis was performed using the standard protocol following bolus administration of intravenous contrast. CONTRAST:  149m ISOVUE-300 IOPAMIDOL (ISOVUE-300) INJECTION 61% COMPARISON:  01/01/2016 CT of abdomen and pelvis. FINDINGS: Lower chest: Bilateral breast implants. Normal heart size. No pericardial effusion. Clear lung bases. Interval resolution of pleural effusion. Hepatobiliary: No focal liver abnormality is seen. No gallstones, gallbladder wall thickening, or biliary dilatation. Pancreas: Unremarkable. No pancreatic ductal dilatation or surrounding inflammatory changes. Spleen: Normal in size without focal abnormality. Adrenals/Urinary Tract: Adrenal glands are unremarkable. Kidneys are normal, without renal calculi, focal lesion, or hydronephrosis. Severely distended bladder. Stomach/Bowel: Small hiatal hernia. No evidence for bowel obstruction. Further interval improvement inflammatory changes of the colon with mild residual mid descending colon. Small bowel is unremarkable. There is a large a U shaped perianal abscess measuring 64 x 46 mm axially extending from the 3 to 9 o'clock containing air and fluid. 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 Additionally there is fat stranding and inflammatory changes throughout the age she anal fat extending into left lower gluteal fold and left labia majora with air. Vascular/Lymphatic: Aortic atherosclerosis. No enlarged abdominal or pelvic lymph nodes. Reproductive:  Status post hysterectomy. No adnexal masses. Other: Choose Musculoskeletal: No acute or significant osseous findings. IMPRESSION: 1. Growing perianal abscess and fistula. Inflammatory changes extend throughout bilateral ischioanal fat, the left lower gluteal fold, and into the left labia majora. There are multiple foci of air concerning for gas-forming organism. 2. Further improvement in inflammatory changes of the colon with mild residual in the descending colon. These results will be called to the ordering clinician or representative by the Radiologist Assistant, and communication documented in the PACS or zVision Dashboard. Electronically Signed   By: LEdgardo RoysD.  On: 01/21/2016 16:58     Scheduled Meds: . sodium chloride   Intravenous Once  . chlorhexidine  15 mL Mouth/Throat BID  . dicyclomine  10 mg Oral TID AC & HS  . enoxaparin (LOVENOX) injection  40 mg Subcutaneous Q24H  . FLUoxetine  20 mg Oral Daily  . hydrocortisone  25 mg Rectal Q0600  . methylPREDNISolone (SOLU-MEDROL) injection  30 mg Intravenous Q12H  . oxymorphone  20 mg Oral QAC breakfast  . oxymorphone  30 mg Oral QHS  . piperacillin-tazobactam (ZOSYN)  IV  3.375 g Intravenous Q8H   Continuous Infusions: . sodium chloride 125 mL/hr at 01/22/16 1007     LOS: 14 days   Time Spent in minutes   30 minutes  Sander Remedios D.O. on 01/22/2016 at 10:58 AM  Between 7am to 7pm - Pager - [phone]  After 7pm go to [email or web address] - password [email or web address]  And look for the night coverage person covering for me after hours  [facility] Office  [phone]

## 2016-01-22 NOTE — Consult Note (Signed)
Reason for Consult: Perirectal abscess and fistula Referring Physician: Dr. Zenovia Jarred   Sara Barnes is an 75 y.o. female.  HPI:  75 year old   Female admitted 01/07/16 with abdominal pain. She has a history of significant ulcerative colitis versus Crohn's disease. She was previously hospitalized 8/28- 12/29/15 with a similar problem. During that hospitalization C. difficile was ruled out CMV was also negative. There is no evidence of perianal abscess or fissure at that time. On readmission at this point she was started on IV Solu-Medrol and Remicade. Flexible sigmoidoscopy on 01/08/16, again showed no evidence of perirectal or perianal abscess or fistula. There was severe inflammation characterized by congestion, friability ostomy vascularity and deep ulceration was found from the rectum to the sigmoid colon there were many large deep ulcers noted in the rectum. It was Dr. Doyne Keel opinion that this was more consistent with Crohn's than a typical ulcerative colitis. Pathology:Colon, biopsy, rectosigmoid ACTIVE EROSIVE ULCER, CONSISTENT WITH CHRONIC ACTIVE ULCERATIVE COLITIS. NO EVIDENCE OF CMV OR DYSPLASIA. She was started on Remicade 01/09/16. She was also continued on IV steroids. He was initially felt that she was improving with this treatment. She continued to have significant discomfort and rectal bleeding by 9/21 she was having a large stools containing large amounts of clotted blood. On 01/21/16 hemoglobin was down to 7.6 hematocrit 23 she was transfused 2 units of packed cells. Repeat CT scan was obtained last evening. This shows a large U-shaped perianal abscess measuring 6 x 4.6 cm from the 3 to 9:00 positions containing air-fluid there is a fistula at 6:00 with additional fat stranding and inflammatory changes going into the left gluteal fold and the left labia major with air. We are asked to see.   Past Medical History:  Diagnosis Date  . Basal cell carcinoma   . 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 Complication of anesthesia    anxiety post-op after ear surgeries  . Dental crowns present    recent root canal 11/2014  . Family history of adverse reaction to anesthesia    pt's sister has hx. of post-op N/V  . Indigestion   . PONV (postoperative nausea and vomiting)   . Runny nose 12/07/2014   clear drainage, per pt.  . Seasonal allergies   . Ulcerative colitis   . Vitamin B12 deficiency     Past Surgical History:  Procedure Laterality Date  . ABDOMINAL HYSTERECTOMY     partial  . APPENDECTOMY    . BASAL CELL CARCINOMA EXCISION Left    nose  . BREAST ENHANCEMENT SURGERY    . BREAST LUMPECTOMY Left    age 60's  . BREAST LUMPECTOMY WITH RADIOACTIVE SEED AND SENTINEL LYMPH NODE BIOPSY Left 12/13/2014   Procedure: LEFT BREAST LUMPECTOMY WITH RADIOACTIVE SEED AND SENTINEL LYMPH NODE BIOPSY;  Surgeon: [name], MD;  Location: [facility];  Service: General;  Laterality: Left;  . COLONOSCOPY WITH PROPOFOL  05/28/2011; 06/28/2014  . FLEXIBLE SIGMOIDOSCOPY N/A 01/08/2016   Procedure: FLEXIBLE SIGMOIDOSCOPY;  Surgeon: [name], MD;  Location: [facility];  Service: Gastroenterology;  Laterality: N/A;  needs MAC due to pain and anxiety  . INCISION AND DRAINAGE PERIRECTAL ABSCESS N/A 09/28/2013   Procedure: IRRIGATION AND DEBRIDEMENT PERIANAL ABSCESS, proctoscopy;  Surgeon: Shann Medal, MD;  Location: [facility];  Service: General;  Laterality: N/A;  . INNER EAR SURGERY Bilateral     Family History  Problem Relation Age of Onset  .  Prostate cancer Brother 3  . Kidney disease Brother   . Diabetes Brother   . Brain cancer Father   . Rheum arthritis Mother   . Anesthesia problems Sister     post-op N/V  . Emphysema Maternal Grandfather   . Asthma Sister   . Rheum arthritis Brother   . Colon cancer Neg Hx   . Esophageal cancer Neg Hx   . Breast cancer Neg Hx     Social History:  reports that she has never  smoked. She has never used smokeless tobacco. She reports that she drinks alcohol. She reports that she does not use drugs.  Allergies:  Allergies  Allergen Reactions  . Codeine Nausea And Vomiting  . Mesalamine Nausea And Vomiting  . Other Itching    PERFUMED LOTIONS  Cant have MRI due to ear implant   . Oxycodone-Acetaminophen Rash    Itching & rash Tolerates Dilaudid  . Sulfa Antibiotics Rash    Medications:  Prior to Admission:  Prescriptions Prior to Admission  Medication Sig Dispense Refill Last Dose  . acetaminophen (TYLENOL) 500 MG tablet Take 1,000 mg by mouth every 6 (six) hours as needed for mild pain, moderate pain, fever or headache.   01/07/2016 at 0800  . acetaminophen (TYLENOL) 650 MG CR tablet Take 650 mg by mouth every 8 (eight) hours as needed for pain.   01/07/2016 at 0300  . cyanocobalamin (,VITAMIN B-12,) 1000 MCG/ML injection INJECT 1 ML (1,000 MCG TOTAL) INTO THE MUSCLE EVERY 30 (THIRTY) DAYS. 12 mL 0 11/2015  . FLUoxetine (PROZAC) 20 MG capsule Take 1 capsule (20 mg total) by mouth daily. 90 capsule 1 01/06/2016 at Unknown time  . fluticasone (FLONASE) 50 MCG/ACT nasal spray Place 2 sprays into both nostrils daily. (Patient taking differently: Place 2 sprays into both nostrils daily as needed for allergies or rhinitis. ) 16 g 6 Past Month at Unknown time  . HYDROcodone-acetaminophen (NORCO/VICODIN) 5-325 MG tablet Take 2 tablets by mouth every 4 (four) hours as needed. (Patient taking differently: Take 2 tablets by mouth every 4 (four) hours as needed for moderate pain. ) 30 tablet 0 01/06/2016 at Unknown time  . hydrocortisone (ANUSOL-HC) 25 MG suppository Place 1 suppository (25 mg total) rectally daily. 12 suppository 0 Past Week at Unknown time  . mesalamine (CANASA) 1000 MG suppository Place 1 suppository (1,000 mg total) rectally at bedtime. 30 suppository 12 Past Week at Unknown time  . ondansetron (ZOFRAN) 4 MG tablet Take 1 tablet (4 mg total) by mouth every  8 (eight) hours as needed for nausea or vomiting. 11 tablet 0 01/06/2016 at Unknown time  . potassium chloride 20 MEQ TBCR Take 20 mEq by mouth daily. 20 tablet 0 01/06/2016 at Unknown time  . [EXPIRED] predniSONE (DELTASONE) 20 MG tablet Take 2 tablets (40 mg total) by mouth daily with breakfast. (Patient taking differently: Take 40 mg by mouth daily with breakfast. Started 09/02 for 18 days) 36 tablet 0 01/06/2016 at Unknown time  . zolpidem (AMBIEN) 5 MG tablet Take 1 tablet (5 mg total) by mouth at bedtime as needed. (Patient taking differently: Take 5 mg by mouth at bedtime. ) 30 tablet 2 01/06/2016 at Unknown time  . belladonna-PHENObarbital (DONNATAL) 16.2 MG/5ML ELIX Take 5 mLs (16.2 mg total) by mouth 3 (three) times daily. (Patient not taking: Reported on 01/07/2016) 600 mL 0 Not Taking at Unknown time   Scheduled: . chlorhexidine  15 mL Mouth/Throat BID  . ciprofloxacin  400 mg Intravenous  Q12H  . dicyclomine  10 mg Oral TID AC & HS  . enoxaparin (LOVENOX) injection  40 mg Subcutaneous Q24H  . FLUoxetine  20 mg Oral Daily  . hydrocortisone  25 mg Rectal Q0600  . methylPREDNISolone (SOLU-MEDROL) injection  30 mg Intravenous Q12H  . metronidazole  500 mg Intravenous Q6H  . oxymorphone  20 mg Oral QAC breakfast  . oxymorphone  30 mg Oral QHS   Continuous:  TZG:[id number], HYDROcodone-acetaminophen, HYDROmorphone (DILAUDID) injection, ondansetron, zolpidem Anti-infectives    Start     Dose/Rate Route Frequency Ordered Stop   01/21/16 1830  ciprofloxacin (CIPRO) IVPB 400 mg     400 mg 200 mL/hr over 60 Minutes Intravenous Every 12 hours 01/21/16 1744     01/21/16 1830  metroNIDAZOLE (FLAGYL) IVPB 500 mg     500 mg 100 mL/hr over 60 Minutes Intravenous Every 6 hours 01/21/16 1744        Results for orders placed or performed during the hospital encounter of 01/07/16 (from the past 48 hour(s))  CBC     Status: Abnormal   Collection Time: 01/21/16  5:22 AM  Result Value Ref Range    WBC 14.2 (H) 4.0 - 10.5 K/uL   RBC 2.46 (L) 3.87 - 5.11 MIL/uL   Hemoglobin 7.6 (L) 12.0 - 15.0 g/dL   HCT 23.1 (L) 36.0 - 46.0 %   MCV 93.9 78.0 - 100.0 fL   MCH 30.9 26.0 - 34.0 pg   MCHC 32.9 30.0 - 36.0 g/dL   RDW 13.5 11.5 - 15.5 %   Platelets 238 150 - 400 K/uL  C-reactive protein     Status: Abnormal   Collection Time: 01/21/16  5:22 AM  Result Value Ref Range   CRP 14.1 (H) <1.0 mg/dL    Comment: Performed at [facility] metabolic panel     Status: Abnormal   Collection Time: 01/21/16  5:22 AM  Result Value Ref Range   Sodium 134 (L) 135 - 145 mmol/L   Potassium 3.8 3.5 - 5.1 mmol/L   Chloride 102 101 - 111 mmol/L   CO2 25 22 - 32 mmol/L   Glucose, Bld 94 65 - 99 mg/dL   BUN 23 (H) 6 - 20 mg/dL   Creatinine, Ser 0.84 0.44 - 1.00 mg/dL   Calcium 8.5 (L) 8.9 - 10.3 mg/dL   GFR calc non Af Amer >60 >60 mL/min   GFR calc Af Amer >60 >60 mL/min    Comment: (NOTE) The eGFR has been calculated using the CKD EPI equation. This calculation has not been validated in all clinical situations. eGFR's persistently <60 mL/min signify possible Chronic Kidney Disease.    Anion gap 7 5 - 15  Type and screen [place]     Status: None   Collection Time: 01/21/16  8:16 AM  Result Value Ref Range   ABO/RH(D) A NEG    Antibody Screen NEG    Sample Expiration 01/24/2016    Unit Number [id number]    Blood Component Type RED CELLS,LR    Unit division 00    Status of Unit ISSUED,FINAL    Transfusion Status OK TO TRANSFUSE    Crossmatch Result Compatible    Unit Number [id number]    Blood Component Type RBC LR PHER1    Unit division 00    Status of Unit ISSUED,FINAL    Transfusion Status OK TO TRANSFUSE    Crossmatch Result Compatible   Prepare RBC  Status: None   Collection Time: 01/21/16  8:22 AM  Result Value Ref Range   Order Confirmation OK TO TRANSFUSE   C difficile quick scan w PCR reflex     Status: None   Collection Time:  01/21/16  4:43 PM  Result Value Ref Range   C Diff antigen NEGATIVE NEGATIVE   C Diff toxin NEGATIVE NEGATIVE   C Diff interpretation No C. difficile detected.   Hemoglobin and hematocrit, blood     Status: Abnormal   Collection Time: 01/21/16  7:07 PM  Result Value Ref Range   Hemoglobin 10.0 (L) 12.0 - 15.0 g/dL    Comment: POST TRANSFUSION SPECIMEN   HCT 29.9 (L) 36.0 - 46.0 %  CBC with Differential     Status: Abnormal   Collection Time: 01/22/16  5:39 AM  Result Value Ref Range   WBC 11.8 (H) 4.0 - 10.5 K/uL   RBC 2.81 (L) 3.87 - 5.11 MIL/uL   Hemoglobin 8.6 (L) 12.0 - 15.0 g/dL   HCT 25.6 (L) 36.0 - 46.0 %   MCV 91.1 78.0 - 100.0 fL   MCH 30.6 26.0 - 34.0 pg   MCHC 33.6 30.0 - 36.0 g/dL   RDW 14.6 11.5 - 15.5 %   Platelets 165 150 - 400 K/uL   Neutrophils Relative % 91 %   Lymphocytes Relative 2 %   Monocytes Relative 7 %   Eosinophils Relative 0 %   Basophils Relative 0 %   Neutro Abs 10.8 (H) 1.7 - 7.7 K/uL   Lymphs Abs 0.2 (L) 0.7 - 4.0 K/uL   Monocytes Absolute 0.8 0.1 - 1.0 K/uL   Eosinophils Absolute 0.0 0.0 - 0.7 K/uL   Basophils Absolute 0.0 0.0 - 0.1 K/uL   WBC Morphology DOHLE BODIES   Comprehensive metabolic panel Once     Status: Abnormal   Collection Time: 01/22/16  5:39 AM  Result Value Ref Range   Sodium 138 135 - 145 mmol/L   Potassium 4.1 3.5 - 5.1 mmol/L   Chloride 107 101 - 111 mmol/L   CO2 26 22 - 32 mmol/L   Glucose, Bld 105 (H) 65 - 99 mg/dL   BUN 16 6 - 20 mg/dL   Creatinine, Ser 0.57 0.44 - 1.00 mg/dL   Calcium 8.2 (L) 8.9 - 10.3 mg/dL   Total Protein 4.6 (L) 6.5 - 8.1 g/dL   Albumin 2.0 (L) 3.5 - 5.0 g/dL   AST 12 (L) 15 - 41 U/L   ALT 14 14 - 54 U/L   Alkaline Phosphatase 69 38 - 126 U/L   Total Bilirubin 0.4 0.3 - 1.2 mg/dL   GFR calc non Af Amer >60 >60 mL/min   GFR calc Af Amer >60 >60 mL/min    Comment: (NOTE) The eGFR has been calculated using the CKD EPI equation. This calculation has not been validated in all clinical  situations. eGFR's persistently <60 mL/min signify possible Chronic Kidney Disease.    Anion gap 5 5 - 15    Ct Abdomen Pelvis W Contrast  Result Date: 01/21/2016 CLINICAL DATA:  75 y/o F; history of inflammatory bowel disease, believed to be ulcerative colitis, presenting with severe abdominal pain for several days. EXAM: CT ABDOMEN AND PELVIS WITH CONTRAST TECHNIQUE: Multidetector CT imaging of the abdomen and pelvis was performed using the standard protocol following bolus administration of intravenous contrast. CONTRAST:  110m ISOVUE-300 IOPAMIDOL (ISOVUE-300) INJECTION 61% COMPARISON:  01/01/2016 CT of abdomen and pelvis. FINDINGS: Lower chest:  Bilateral breast implants. Normal heart size. No pericardial effusion. Clear lung bases. Interval resolution of pleural effusion. Hepatobiliary: No focal liver abnormality is seen. No gallstones, gallbladder wall thickening, or biliary dilatation. Pancreas: Unremarkable. No pancreatic ductal dilatation or surrounding inflammatory changes. Spleen: Normal in size without focal abnormality. Adrenals/Urinary Tract: Adrenal glands are unremarkable. Kidneys are normal, without renal calculi, focal lesion, or hydronephrosis. Severely distended bladder. Stomach/Bowel: Small hiatal hernia. No evidence for bowel obstruction. Further interval improvement inflammatory changes of the colon with mild residual mid descending colon. Small bowel is unremarkable. There is a large a U shaped perianal abscess measuring 64 x 46 mm axially extending from the 3 to 9 o'clock containing air and fluid. 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 Additionally there is fat stranding and inflammatory changes throughout the age she anal fat extending into left lower gluteal fold and left labia majora with air. Vascular/Lymphatic: Aortic atherosclerosis. No enlarged abdominal or pelvic lymph nodes. Reproductive: Status post hysterectomy. No adnexal masses.  Other: [facility] Musculoskeletal: No acute or significant osseous findings. IMPRESSION: 1. Growing perianal abscess and fistula. Inflammatory changes extend throughout bilateral ischioanal fat, the left lower gluteal fold, and into the left labia majora. There are multiple foci of air concerning for gas-forming organism. 2. Further improvement in inflammatory changes of the colon with mild residual in the descending colon. These results will be called to the ordering clinician or representative by the Radiologist Assistant, and communication documented in the PACS or zVision Dashboard. Electronically Signed   By: Kristine Garbe M.D.   On: 01/21/2016 16:58    Review of Systems  Constitutional: Positive for chills.  HENT: Positive for hearing loss (she is very hard of hearing).   Eyes: Negative.   Respiratory: Negative.   Cardiovascular: Negative.   Gastrointestinal: Positive for abdominal pain.       Pt pain is in the rectal area but says it goes to her pelvis.    Genitourinary: Negative.   Musculoskeletal: Negative.   Skin: Negative.        Whole perirectal site is so tender and swollen she cannot lie flat.  Neurological: Positive for weakness.  Endo/Heme/Allergies: Bruises/bleeds easily (Rectal bleeding with transfusion).  Psychiatric/Behavioral: Positive for depression.   Blood pressure 97/61, pulse (!) 111, temperature 98.4 F (36.9 C), temperature source Oral, resp. rate 19, height 5' 6"  (1.676 m), weight 72.6 kg (160 lb), SpO2 96 %. Physical Exam  Constitutional:  This is a chronically ill-appearing woman. She appears pale and extremely fragile.  HENT:  Head: Normocephalic and atraumatic.  Eyes: Right eye exhibits no discharge. Left eye exhibits no discharge. No scleral icterus.  Neck: Normal range of motion. Neck supple. No JVD present. No tracheal deviation present. No thyromegaly present.  Cardiovascular: Normal rate, regular rhythm, normal heart sounds and intact distal  pulses.   No murmur heard. Respiratory: Effort normal and breath sounds normal. No respiratory distress. She has no wheezes. She has no rales. She exhibits no tenderness.  GI: Soft. She exhibits no distension and no mass. There is no tenderness. There is no rebound and no guarding.  Genitourinary:  Genitourinary Comments: Both the left and the right buttocks are tender and swollen. The left side is more tender in the swelling extends up to the labia major. I cannot examine her rectum because of the tenderness and swelling. There is a good deal of clotted blood or fecal matter at the entrance of the rectum.  She cannot lie flat secondary to the pain and discomfort.  Musculoskeletal: She exhibits no edema or tenderness.  Lymphadenopathy:    She has no cervical adenopathy.  Neurological: She is alert.  She is alert, and oriented but her mentation is extremely slow. He is also extremely hard of hearing. She also feels just terrible all over, all which makes her mentation slower.  Skin: Skin is dry. No rash noted. No erythema. No pallor.  Pale,   Psychiatric: She has a normal mood and affect. Her behavior is normal. Judgment and thought content normal.    Assessment/Plan: Perirectal abscess and fistula Ulcerative colitis/Crohn's disease treated with IV steroids and Remicade acutely Rectal bleeding with anemia Hypotension/tachycardia Depression/anxiety  Plan: Based on exam will plan to take her to the operating room for exam under anesthesia, incision and drainage of perirectal abscess and fistula. I will change her antibiotics to IV Zosyn and start IV fluids be sure she has a type and screen for possible transfusion. We've asked her to contact her son so he can be present and assist with decision-making.  Lizzeth Meder 01/22/2016, 7:47 AM

## 2016-01-22 NOTE — Progress Notes (Signed)
CT scan reviewed. Discussed CT and plan for surgery involvement with patient by phone this a.m. Dr. Carlean Purl started abx last night when he received the CT read report I called Surg consult this morning for help with perirectal abscess management.  They will see her today.  She is NPO Flex sig considered for today has been canceled in light of CT findings Will see patient in person later this morning

## 2016-01-22 NOTE — Interval H&P Note (Signed)
History and Physical Interval Note:  01/22/2016 12:36 PM  Sara Barnes  has presented today for surgery, with the diagnosis of perirectal abcess.  The various methods of treatment have been discussed with the patient and family. After consideration of risks, benefits and other options for treatment, the patient has consented to    Procedure(s): IRRIGATION AND DEBRIDEMENT PERIRECTAL ABSCESS (N/A) as a surgical intervention .    The patient's history has been reviewed, patient examined, no change in status, stable for surgery.  I have reviewed the patient's chart and labs.  Questions were answered to the patient's satisfaction.    Earnstine Regal, MD, [facility] Office: [place]

## 2016-01-22 NOTE — Anesthesia Postprocedure Evaluation (Signed)
Anesthesia Post Note  Patient: Sara Barnes  Procedure(s) Performed: Procedure(s) (LRB): IRRIGATION AND DEBRIDEMENT PERIRECTAL ABSCESS (N/A)  Patient location during evaluation: PACU Anesthesia Type: General Level of consciousness: awake and alert Pain management: pain level controlled Vital Signs Assessment: post-procedure vital signs reviewed and stable Respiratory status: spontaneous breathing, nonlabored ventilation, respiratory function stable and patient connected to nasal cannula oxygen Cardiovascular status: blood pressure returned to baseline and stable Postop Assessment: no signs of nausea or vomiting Anesthetic complications: no    Last Vitals:  Vitals:   01/22/16 1430 01/22/16 1440  BP: 107/65 111/67  Pulse: 87 86  Resp: 16 16  Temp: 36.6 C 37 C    Last Pain:  Vitals:   01/22/16 1430  TempSrc:   PainSc: 3                  Irys Nigh J

## 2016-01-22 NOTE — Anesthesia Procedure Notes (Signed)
Procedure Name: LMA Insertion Date/Time: 01/22/2016 12:49 PM Performed by: Deliah Boston Pre-anesthesia Checklist: Patient identified, Emergency Drugs available, Suction available and Patient being monitored Patient Re-evaluated:Patient Re-evaluated prior to inductionOxygen Delivery Method: Circle system utilized Preoxygenation: Pre-oxygenation with 100% oxygen Intubation Type: IV induction Ventilation: Mask ventilation without difficulty LMA: LMA with gastric port inserted LMA Size: 4.0 Number of attempts: 1 Placement Confirmation: positive ETCO2 and breath sounds checked- equal and bilateral Tube secured with: Tape Dental Injury: Teeth and Oropharynx as per pre-operative assessment

## 2016-01-22 NOTE — Brief Op Note (Signed)
01/07/2016 - 01/22/2016  1:32 PM  PATIENT:  Sara Barnes  75 y.o. female  PRE-OPERATIVE DIAGNOSIS:  perirectal abcess  POST-OPERATIVE DIAGNOSIS:  perirectal abcess  PROCEDURE:  Procedure(s): IRRIGATION AND DEBRIDEMENT PERIRECTAL ABSCESS (N/A)  SURGEON:  Surgeon(s) and Role:    * Armandina Gemma, MD - Primary  ANESTHESIA:   general  EBL:  Total I/O In: 29 [Blood:335] Out: -   BLOOD ADMINISTERED:none  DRAINS: Penrose drain passes posterior to rectum, bilat abscess cavities packed with vaginal packing   LOCAL MEDICATIONS USED:  NONE  SPECIMEN:  Source of Specimen:  aerobic and anaerobic cultures to lab  DISPOSITION OF SPECIMEN:  Lab  COUNTS:  YES  TOURNIQUET:  * No tourniquets in log *  DICTATION: .Other Dictation: Dictation Number [id number]  PLAN OF CARE: Admit to inpatient   PATIENT DISPOSITION:  PACU - hemodynamically stable.   Delay start of Pharmacological VTE agent (>24hrs) due to surgical blood loss or risk of bleeding: yes  Earnstine Regal, MD, [facility] Office: [phone]

## 2016-01-23 ENCOUNTER — Other Ambulatory Visit: Payer: Self-pay

## 2016-01-23 DIAGNOSIS — R1030 Lower abdominal pain, unspecified: Secondary | ICD-10-CM

## 2016-01-23 DIAGNOSIS — D62 Acute posthemorrhagic anemia: Secondary | ICD-10-CM

## 2016-01-23 LAB — TYPE AND SCREEN
ABO/RH(D): A NEG
ANTIBODY SCREEN: NEGATIVE
UNIT DIVISION: 0
UNIT DIVISION: 0
Unit division: 0
Unit division: 0

## 2016-01-23 LAB — CBC WITH DIFFERENTIAL/PLATELET
BASOS ABS: 0 10*3/uL (ref 0.0–0.1)
Basophils Relative: 0 %
EOS PCT: 0 %
Eosinophils Absolute: 0 10*3/uL (ref 0.0–0.7)
HEMATOCRIT: 30.3 % — AB (ref 36.0–46.0)
Hemoglobin: 10.1 g/dL — ABNORMAL LOW (ref 12.0–15.0)
LYMPHS ABS: 0.4 10*3/uL — AB (ref 0.7–4.0)
Lymphocytes Relative: 4 %
MCH: 29.7 pg (ref 26.0–34.0)
MCHC: 33.3 g/dL (ref 30.0–36.0)
MCV: 89.1 fL (ref 78.0–100.0)
MONO ABS: 0.5 10*3/uL (ref 0.1–1.0)
MONOS PCT: 5 %
NEUTROS ABS: 8.3 10*3/uL — AB (ref 1.7–7.7)
Neutrophils Relative %: 91 %
Platelets: 118 10*3/uL — ABNORMAL LOW (ref 150–400)
RBC: 3.4 MIL/uL — ABNORMAL LOW (ref 3.87–5.11)
RDW: 15.9 % — AB (ref 11.5–15.5)
WBC: 9.2 10*3/uL (ref 4.0–10.5)

## 2016-01-23 LAB — COMPREHENSIVE METABOLIC PANEL
ALT: 13 U/L — ABNORMAL LOW (ref 14–54)
ANION GAP: 7 (ref 5–15)
AST: 11 U/L — ABNORMAL LOW (ref 15–41)
Albumin: 1.8 g/dL — ABNORMAL LOW (ref 3.5–5.0)
Alkaline Phosphatase: 61 U/L (ref 38–126)
BILIRUBIN TOTAL: 0.5 mg/dL (ref 0.3–1.2)
BUN: 11 mg/dL (ref 6–20)
CO2: 26 mmol/L (ref 22–32)
Calcium: 7.9 mg/dL — ABNORMAL LOW (ref 8.9–10.3)
Chloride: 102 mmol/L (ref 101–111)
Creatinine, Ser: 0.6 mg/dL (ref 0.44–1.00)
GFR calc Af Amer: >60 mL/min (ref >60)
Glucose, Bld: 101 mg/dL — ABNORMAL HIGH (ref 65–99)
POTASSIUM: 4.4 mmol/L (ref 3.5–5.1)
Sodium: 135 mmol/L (ref 135–145)
TOTAL PROTEIN: 4.3 g/dL — AB (ref 6.5–8.1)

## 2016-01-23 LAB — C-REACTIVE PROTEIN: CRP: 26.8 mg/dL — AB (ref <1.0)

## 2016-01-23 MED ORDER — MIDAZOLAM HCL 2 MG/2ML IJ SOLN
2.0000 mg | Freq: Once | INTRAMUSCULAR | Status: DC
  Filled 2016-01-23 (×2): qty 2

## 2016-01-23 NOTE — Progress Notes (Signed)
[facility] Progress Note  Subjective:  Feels ok this AM; surgery will be removing packing/dressings from I&D site later this AM.  No new complaints.  SQ Lovenox restarted yesterday.  Objective:  Vital signs in last 24 hours: Temp:  [97.6 F (36.4 C)-99.6 F (37.6 C)] 97.6 F (36.4 C) (09/27 0357) Pulse Rate:  [69-99] 73 (09/27 0357) Resp:  [13-18] 18 (09/27 0357) BP: (94-133)/(53-90) 112/69 (09/27 0357) SpO2:  [93 %-100 %] 97 % (09/27 0357) Last BM Date: 01/22/16 General:  Alert, Well-developed, in NAD Heart:  Regular rate and rhythm; no murmurs Pulm:  CTAB.  No W/R/R. Abdomen:  Soft, non-distended.  BS present.  Lower abdominal TTP. Extremities:  Without edema. Neurologic:  Alert and oriented x 4;  grossly normal neurologically. Psych:  Alert and cooperative. Normal mood and affect.  Intake/Output from previous day: 09/26 0701 - 09/27 0700 In: 3925.4 [P.O.:360; I.V.:2460.4; Blood:1005; IV Piggyback:100] Out: 0  Intake/Output this shift: Total I/O In: 240 [P.O.:240] Out: -   Lab Results:  Recent Labs  01/21/16 0522 01/21/16 1907 01/22/16 0539 01/23/16 0605  WBC 14.2*  --  11.8* 9.2  HGB 7.6* 10.0* 8.6* 10.1*  HCT 23.1* 29.9* 25.6* 30.3*  PLT 238  --  165 118*   BMET  Recent Labs  01/21/16 0522 01/22/16 0539 01/23/16 0605  NA 134* 138 135  K 3.8 4.1 4.4  CL 102 107 102  CO2 25 26 26   GLUCOSE 94 105* 101*  BUN 23* 16 11  CREATININE 0.84 0.57 0.60  CALCIUM 8.5* 8.2* 7.9*   LFT  Recent Labs  01/23/16 0605  PROT 4.3*  ALBUMIN 1.8*  AST 11*  ALT 13*  ALKPHOS 61  BILITOT 0.5   Ct Abdomen Pelvis W Contrast  Result Date: 01/21/2016 CLINICAL DATA:  75 y/o F; history of inflammatory bowel disease, believed to be ulcerative colitis, presenting with severe abdominal pain for several days. EXAM: CT ABDOMEN AND PELVIS WITH CONTRAST TECHNIQUE: Multidetector CT imaging of the abdomen and pelvis was performed using the standard protocol  following bolus administration of intravenous contrast. CONTRAST:  160m ISOVUE-300 IOPAMIDOL (ISOVUE-300) INJECTION 61% COMPARISON:  01/01/2016 CT of abdomen and pelvis. FINDINGS: Lower chest: Bilateral breast implants. Normal heart size. No pericardial effusion. Clear lung bases. Interval resolution of pleural effusion. Hepatobiliary: No focal liver abnormality is seen. No gallstones, gallbladder wall thickening, or biliary dilatation. Pancreas: Unremarkable. No pancreatic ductal dilatation or surrounding inflammatory changes. Spleen: Normal in size without focal abnormality. Adrenals/Urinary Tract: Adrenal glands are unremarkable. Kidneys are normal, without renal calculi, focal lesion, or hydronephrosis. Severely distended bladder. Stomach/Bowel: Small hiatal hernia. No evidence for bowel obstruction. Further interval improvement inflammatory changes of the colon with mild residual mid descending colon. Small bowel is unremarkable. There is a large a U shaped perianal abscess measuring 64 x 46 mm axially extending from the 3 to 9 o'clock containing air and fluid. 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 Additionally there is fat stranding and inflammatory changes throughout the age she anal fat extending into left lower gluteal fold and left labia majora with air. Vascular/Lymphatic: Aortic atherosclerosis. No enlarged abdominal or pelvic lymph nodes. Reproductive: Status post hysterectomy. No adnexal masses. Other: Choose Musculoskeletal: No acute or significant osseous findings. IMPRESSION: 1. Growing perianal abscess and fistula. Inflammatory changes extend throughout bilateral ischioanal fat, the left lower gluteal fold, and into the left labia majora. There are multiple foci of air  concerning for gas-forming organism. 2. Further improvement in inflammatory changes of the colon with mild residual in the descending colon. These results will be called to the ordering clinician  or representative by the Radiologist Assistant, and communication documented in the PACS or zVision Dashboard. Electronically Signed   By: Kristine Garbe M.D.   On: 01/21/2016 16:58   Assessment / Plan: 1. 75 y.o.femalewith indeterminate colitis, severe rectal ulcers and now large perirectal abscess s/p I&D on 9/26-receiving Remicade for which she is due on 10/2 as long as ok from surgical standpoint.  IV steroids decreased to 20 mg every 12 hours for wound healing.  On Zosyn. 2. Anemia: pt received 2 u prbcs 9/25 and 1 unit 9/26.  Hgb ok this AM.  Monitor Hgb and transfuse prn.    LOS: 15 days   Jamien Casanova D.  01/23/2016, 9:05 AM  Pager number [phone]

## 2016-01-23 NOTE — Progress Notes (Signed)
PROGRESS NOTE    Sara Barnes  UTM:[id number] DOB: [birth year] DOA: 01/07/2016 PCP: Ann Held, DO   Chief Complaint  Patient presents with  . Rectal Bleeding    SINCE FRIDAY     Brief Narrative:  HPI on 01/07/2016 by Dr. Gilman Schmidt [name] a 75 y.o.femalewith medical history significant of ulcerative colitis versus Crohn's disease, believed to be UC eventually,otherwise healthy, presents to the emergency room with chief complaint of severe abdominal pain for the past several days. She was recently hospitalized and discharged home about 9 days prior to admission. She was here in the hospital with similar complaints, placed on steroids, and tells me today that she improved some and really wanted to go home. At home, she has had ongoing symptoms, never really well achieved full remission, and over the last few days she has felt extremely unwell. She has crampy abdominal pain, nausea, poor appetite, barely able to keep down some Gatorade, and has been having significant amounts of bloody bowel movements as well. She has no fever or chills. She has no chest pain or shortness of breath. She denies any palpitations. She has no lightheadedness or dizziness. She feels generalized weakness. In the emergency room her vital signs are stable, her blood work is essentially unremarkable. Gastroenterology was consulted by EDP and have evaluated patient, and TRH was asked for admission  Interim history Gastroenterology consultation appreciated. Patient underwent flexible sigmoidoscopy. CMV was negative and she was started on Remicade. She was initially doing better and tolerating some diet, GI following and since still having a lot of pain and frequent BMs will be dosed with another Remicaide infusion on  9/17. 9/21 pain and bloody clots worsened overnight. CT abd/pelvis showed perirectal abscess- General surgery Was consulted. Patient underwent I&D on 9/26. Steroids were decreased per  GI recommendations  Assessment & Plan   Abdominal pain secondary to Ulcerative colitis flare with rectal bleeding/ Perianal abscess -Gastroenterology consulted and is following -s/p flexible sigmoidoscopy on 01/08/16, biopsy Showed active erosive ulcer. No CMV found. Patient was started on Remicade on 01/09/2016 and 01/13/16. Next dose of Remicade anticipated on 01/28/2016. -GI increased Opana to 91m BID.  -We'll repeat CBC in morning -Patient slight Medrol dose decreased for GI -CRP increasing -Patient currently on ciprofloxacin and Flagyl -Gen. surgery consulted and appreciated, now status post I&D of abscess.  Acute blood loss anemia/Symptomatic anemia -Secondary to the above -hemoglobin 8.6, after 2uPRBCs -Patient was given 2 units appear bases with posttransfusion hemoglobin of 10 this morning  Diarrhea secondary to UC flare -Cdiff negative x2 -Continue supportive care  Depression/Anxiety -Continue prozac   DVT Prophylaxis  Lovenox  Code Status: Full  Family Communication: VOakhurstbedside, patient in room  Disposition Plan:  uncertain at this time.  Consultants Gastroenterology  General surgery  Procedures  Flexible sigmoidoscopy Incision and drainage. No abscess on 01/22/2016  Antibiotics   Anti-infectives    Start     Dose/Rate Route Frequency Ordered Stop   01/22/16 0900  piperacillin-tazobactam (ZOSYN) IVPB 3.375 g     3.375 g 12.5 mL/hr over 240 Minutes Intravenous Every 8 hours 01/22/16 0840     01/21/16 1830  ciprofloxacin (CIPRO) IVPB 400 mg  Status:  Discontinued     400 mg 200 mL/hr over 60 Minutes Intravenous Every 12 hours 01/21/16 1744 01/22/16 0840   01/21/16 1830  metroNIDAZOLE (FLAGYL) IVPB 500 mg  Status:  Discontinued     500 mg 100 mL/hr over 60 Minutes  Intravenous Every 6 hours 01/21/16 1744 01/22/16 0840      Subjective:   The planes of postoperative pain, otherwise without other complaints at this time  Objective:   Vitals:    01/22/16 1940 01/22/16 2340 01/23/16 0357 01/23/16 1425  BP: 133/90 99/68 112/69 115/68  Pulse: 85 69 73 68  Resp: 16 18 18 18   Temp: 98.3 F (36.8 C) 97.9 F (36.6 C) 97.6 F (36.4 C) 98.3 F (36.8 C)  TempSrc: Oral Oral Oral Oral  SpO2: 97% 98% 97% 99%  Weight:      Height:        Intake/Output Summary (Last 24 hours) at 01/23/16 1637 Last data filed at 01/23/16 1424  Gross per 24 hour  Intake          3300.42 ml  Output                0 ml  Net          3300.42 ml   Filed Weights   01/07/16 1136  Weight: 72.6 kg (160 lb)    Exam  General: Well developed, Lying in bed, no acute distress  HEENT: Mucous membrane is moist, dentition fair   Cardiovascular: S1 S2 , regular  Respiratory: Normal respiratory effort, no wheezing  Abdomen: Mildly distended, positive bowel sounds  Extremities: Perfused, no clubbing  Neuro: Cranial nerves II through XII grossly intact, strength and sensation intact throughout  Psych: Normal mood and affect, no auditory visual hallucinations   Data Reviewed: I have personally reviewed following labs and imaging studies  CBC:  Recent Labs Lab 01/19/16 0544  01/20/16 0548 01/21/16 0522 01/21/16 1907 01/22/16 0539 01/23/16 0605  WBC 11.2*  --  13.2* 14.2*  --  11.8* 9.2  NEUTROABS  --   --   --   --   --  10.8* 8.3*  HGB 9.9*  < > 8.2* 7.6* 10.0* 8.6* 10.1*  HCT 30.5*  < > 24.9* 23.1* 29.9* 25.6* 30.3*  MCV 92.4  --  91.9 93.9  --  91.1 89.1  PLT 288  --  252 238  --  165 118*  < > = values in this interval not displayed. Basic Metabolic Panel:  Recent Labs Lab 01/19/16 0544 01/21/16 0522 01/22/16 0539 01/23/16 0605  NA 134* 134* 138 135  K 4.2 3.8 4.1 4.4  CL 101 102 107 102  CO2 25 25 26 26   GLUCOSE 114* 94 105* 101*  BUN 16 23* 16 11  CREATININE 0.60 0.84 0.57 0.60  CALCIUM 8.5* 8.5* 8.2* 7.9*   GFR: Estimated Creatinine Clearance: 62.9 mL/min (by C-G formula based on SCr of 0.6 mg/dL). Liver Function  Tests:  Recent Labs Lab 01/22/16 0539 01/23/16 0605  AST 12* 11*  ALT 14 13*  ALKPHOS 69 61  BILITOT 0.4 0.5  PROT 4.6* 4.3*  ALBUMIN 2.0* 1.8*   No results for input(s): LIPASE, AMYLASE in the last 168 hours. No results for input(s): AMMONIA in the last 168 hours. Coagulation Profile: No results for input(s): INR, PROTIME in the last 168 hours. Cardiac Enzymes: No results for input(s): CKTOTAL, CKMB, CKMBINDEX, TROPONINI in the last 168 hours. BNP (last 3 results) No results for input(s): PROBNP in the last 8760 hours. HbA1C: No results for input(s): HGBA1C in the last 72 hours. CBG: No results for input(s): GLUCAP in the last 168 hours. Lipid Profile: No results for input(s): CHOL, HDL, LDLCALC, TRIG, CHOLHDL, LDLDIRECT in the last 72 hours. Thyroid  Function Tests: No results for input(s): TSH, T4TOTAL, FREET4, T3FREE, THYROIDAB in the last 72 hours. Anemia Panel: No results for input(s): VITAMINB12, FOLATE, FERRITIN, TIBC, IRON, RETICCTPCT in the last 72 hours. Urine analysis:    Component Value Date/Time   COLORURINE YELLOW 04/19/2015 0838   APPEARANCEUR CLOUDY (A) 04/19/2015 0838   LABSPEC 1.013 04/19/2015 0838   PHURINE 6.0 04/19/2015 0838   GLUCOSEU NEGATIVE 04/19/2015 0838   HGBUR MODERATE (A) 04/19/2015 0838   HGBUR small 12/13/2009 0904   BILIRUBINUR NEGATIVE 04/19/2015 0838   BILIRUBINUR ne 01/30/2015 1543   KETONESUR 40 (A) 04/19/2015 0838   PROTEINUR NEGATIVE 04/19/2015 0838   UROBILINOGEN 0.2 01/30/2015 1543   UROBILINOGEN 0.2 09/25/2013 1350   NITRITE NEGATIVE 04/19/2015 0838   LEUKOCYTESUR TRACE (A) 04/19/2015 0838   Sepsis Labs: @LABRCNTIP (procalcitonin:4,lacticidven:4)  ) Recent Results (from the past 240 hour(s))  C difficile quick scan w PCR reflex     Status: None   Collection Time: 01/21/16  4:43 PM  Result Value Ref Range Status   C Diff antigen NEGATIVE NEGATIVE Final   C Diff toxin NEGATIVE NEGATIVE Final   C Diff interpretation No  C. difficile detected.  Final  MRSA PCR Screening     Status: None   Collection Time: 01/22/16 10:55 AM  Result Value Ref Range Status   MRSA by PCR NEGATIVE NEGATIVE Final    Comment:        The GeneXpert MRSA Assay (FDA approved for NASAL specimens only), is one component of a comprehensive MRSA colonization surveillance program. It is not intended to diagnose MRSA infection nor to guide or monitor treatment for MRSA infections.   Aerobic/Anaerobic Culture (surgical/deep wound)     Status: None (Preliminary result)   Collection Time: 01/22/16  1:17 PM  Result Value Ref Range Status   Specimen Description ABSCESS PERIRECTAL  Final   Special Requests NONE  Final   Gram Stain   Final    FEW WBC PRESENT,BOTH PMN AND MONONUCLEAR MANY GRAM POSITIVE COCCI IN PAIRS AND CHAINS MANY GRAM NEGATIVE RODS MANY GRAM POSITIVE RODS MODERATE GRAM NEGATIVE COCCOBACILLI RARE YEAST    Culture   Final    CULTURE REINCUBATED FOR BETTER GROWTH Performed at [facility]    Report Status PENDING  Incomplete      Radiology Studies: No results found.   Scheduled Meds: . chlorhexidine  15 mL Mouth/Throat BID  . dicyclomine  10 mg Oral TID AC & HS  . enoxaparin (LOVENOX) injection  40 mg Subcutaneous Q24H  . FLUoxetine  20 mg Oral Daily  . hydrocortisone  25 mg Rectal Q0600  . methylPREDNISolone (SOLU-MEDROL) injection  20 mg Intravenous Q12H  . midazolam  2 mg Intravenous Once  . oxymorphone  20 mg Oral QAC breakfast  . oxymorphone  30 mg Oral QHS  . piperacillin-tazobactam (ZOSYN)  IV  3.375 g Intravenous Q8H   Continuous Infusions: . sodium chloride 50 mL/hr at 01/23/16 1101     LOS: 15 days    Duaine Radin K  MD. on 01/23/2016 at 4:37 PM  Between 7am to 7pm - Pager - [phone]  After 7pm go to [email or web address] - password [email or web address]  And look for the night coverage person covering for me after hours  [facility] Office  [phone]

## 2016-01-23 NOTE — Op Note (Signed)
Sara Barnes, Sara Barnes                ACCOUNT NO.:  [id number]  MEDICAL RECORD NO.:  [id number]  LOCATION:  68                         FACILITY:  [facility]  PHYSICIAN:  Earnstine Regal, MD      DATE OF BIRTH:  Dec 23, 1940  DATE OF PROCEDURE:  01/22/2016                              OPERATIVE REPORT   PREOPERATIVE DIAGNOSIS:  Perirectal abscess.  POSTOPERATIVE DIAGNOSIS:  Perirectal abscess.  PROCEDURE:  Incision, drainage and packing of complex perirectal abscess.  SURGEON:  Earnstine Regal, M.D.  ANESTHESIA:  General.  ESTIMATED BLOOD LOSS:  Minimal.  PREPARATION:  Betadine.  COMPLICATIONS:  None.  INDICATIONS:  The patient is a 75 year old female with longstanding history of ulcerative colitis.  The patient was admitted on the Medical Service for complications of ulcerative colitis.  The patient underwent CT scan of the pelvis on January 21, 2016, which demonstrated a horseshoe abscess with gas-forming organism tracking into the left labia.  The patient was seen in consultation by General Surgery and prepared for the operating room.  BODY OF REPORT:  Procedure was done in OR #2 at the [facility].  The patient was brought to the operating room, placed in a supine position on the operating room table.  Following administration of general anesthesia, the patient was placed in lithotomy and then prepped and draped in the usual aseptic fashion. After ascertaining that an adequate level of anesthesia had been achieved, an incision was made on the medial left buttock with the electrocautery.  An abscess cavity was immediately entered.  There appeared to be a large amount of old blood, thrombus, debris, and foul- smelling material which was evacuated.  Loculations were broken down with blunt dissection.  Exploration of the abscess cavity shows the tract towards the left labia and along the left side of the rectum. Abscess then tracks posteriorly behind the rectum  and over to the right side.  A counter incision was made on the medial right buttock with the electrocautery.  Again an abscess cavity was entered.  Foul-smelling debris and dark fluid was evacuated.  Abscesses were then irrigated with saline using an Asepto syringe.  Aerobic and anaerobic cultures were taken and submitted to laboratory.  The palpation on both sides simultaneously reveals a complete tract posterior to the rectum representing a horseshoe type abscess.  A Penrose drain was placed across the posterior rectal space and exited through both incisions on both buttocks.  Drain was sutured together externally so that it would remain in place.  The right and left abscess cavities were then packed with Betadine-soaked vaginal packing.  Dry gauze dressings and ABD pads were applied to the wound.  The patient was awakened from anesthesia and brought to the recovery room.  The patient tolerated the procedure well.   Earnstine Regal, MD, [facility] Office: [phone]    TMG/MEDQ  D:  01/22/2016  T:  01/23/2016  Job:  [id number]  cc:   Zenovia Jarred, MD Fax: [phone]

## 2016-01-23 NOTE — Progress Notes (Signed)
1 Day Post-Op  Subjective: She looks a little better this AM, pain is still an issue and she is concerned about taking out packing.    Objective: Vital signs in last 24 hours: Temp:  [97.6 F (36.4 C)-99.6 F (37.6 C)] 97.6 F (36.4 C) (09/27 0357) Pulse Rate:  [69-99] 73 (09/27 0357) Resp:  [13-18] 18 (09/27 0357) BP: (94-133)/(53-90) 112/69 (09/27 0357) SpO2:  [93 %-100 %] 97 % (09/27 0357) Last BM Date: 01/22/16 360 PO 2400IV Voided x 9 BM x 3 Afebrile, VSS BP up from yesterday H/H is up, WBC is normal and CMP stable     Intake/Output from previous day: 09/26 0701 - 09/27 0700 In: 3925.4 [P.O.:360; I.V.:2460.4; Blood:1005; IV Piggyback:100] Out: 0  Intake/Output this shift: No intake/output data recorded.  General appearance: alert, cooperative and no distress Skin: plan to take dressing down after she has has a couple Sitz baths to soak site  Lab Results:   Recent Labs  01/22/16 0539 01/23/16 0605  WBC 11.8* 9.2  HGB 8.6* 10.1*  HCT 25.6* 30.3*  PLT 165 118*    BMET  Recent Labs  01/22/16 0539 01/23/16 0605  NA 138 135  K 4.1 4.4  CL 107 102  CO2 26 26  GLUCOSE 105* 101*  BUN 16 11  CREATININE 0.57 0.60  CALCIUM 8.2* 7.9*   PT/INR No results for input(s): LABPROT, INR in the last 72 hours.   Recent Labs Lab 01/22/16 0539 01/23/16 0605  AST 12* 11*  ALT 14 13*  ALKPHOS 69 61  BILITOT 0.4 0.5  PROT 4.6* 4.3*  ALBUMIN 2.0* 1.8*     Lipase     Component Value Date/Time   LIPASE 31 04/15/2011 1700     Studies/Results: Ct Abdomen Pelvis W Contrast  Result Date: 01/21/2016 CLINICAL DATA:  75 y/o F; history of inflammatory bowel disease, believed to be ulcerative colitis, presenting with severe abdominal pain for several days. EXAM: CT ABDOMEN AND PELVIS WITH CONTRAST TECHNIQUE: Multidetector CT imaging of the abdomen and pelvis was performed using the standard protocol following bolus administration of intravenous contrast. CONTRAST:   129m ISOVUE-300 IOPAMIDOL (ISOVUE-300) INJECTION 61% COMPARISON:  01/01/2016 CT of abdomen and pelvis. FINDINGS: Lower chest: Bilateral breast implants. Normal heart size. No pericardial effusion. Clear lung bases. Interval resolution of pleural effusion. Hepatobiliary: No focal liver abnormality is seen. No gallstones, gallbladder wall thickening, or biliary dilatation. Pancreas: Unremarkable. No pancreatic ductal dilatation or surrounding inflammatory changes. Spleen: Normal in size without focal abnormality. Adrenals/Urinary Tract: Adrenal glands are unremarkable. Kidneys are normal, without renal calculi, focal lesion, or hydronephrosis. Severely distended bladder. Stomach/Bowel: Small hiatal hernia. No evidence for bowel obstruction. Further interval improvement inflammatory changes of the colon with mild residual mid descending colon. Small bowel is unremarkable. There is a large a U shaped perianal abscess measuring 64 x 46 mm axially extending from the 3 to 9 o'clock containing air and fluid. 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 Additionally there is fat stranding and inflammatory changes throughout the age she anal fat extending into left lower gluteal fold and left labia majora with air. Vascular/Lymphatic: Aortic atherosclerosis. No enlarged abdominal or pelvic lymph nodes. Reproductive: Status post hysterectomy. No adnexal masses. Other: Choose Musculoskeletal: No acute or significant osseous findings. IMPRESSION: 1. Growing perianal abscess and fistula. Inflammatory changes extend throughout bilateral ischioanal fat, the left lower gluteal fold, and into the left labia majora. There are multiple foci  of air concerning for gas-forming organism. 2. Further improvement in inflammatory changes of the colon with mild residual in the descending colon. These results will be called to the ordering clinician or representative by the Radiologist Assistant, and communication  documented in the PACS or zVision Dashboard. Electronically Signed   By: Kristine Garbe M.D.   On: 01/21/2016 16:58    Medications: . chlorhexidine  15 mL Mouth/Throat BID  . dicyclomine  10 mg Oral TID AC & HS  . enoxaparin (LOVENOX) injection  40 mg Subcutaneous Q24H  . FLUoxetine  20 mg Oral Daily  . hydrocortisone  25 mg Rectal Q0600  . methylPREDNISolone (SOLU-MEDROL) injection  20 mg Intravenous Q12H  . oxymorphone  20 mg Oral QAC breakfast  . oxymorphone  30 mg Oral QHS  . piperacillin-tazobactam (ZOSYN)  IV  3.375 g Intravenous Q8H   . sodium chloride 125 mL/hr at 01/22/16 1924    Assessment/Plan Perirectal abscess and fistula Ulcerative colitis/Crohn's disease treated with IV steroids and Remicade acutely Rectal bleeding with anemia Pain control  Hypotension/tachycardia Depression/anxiety FEN:  IV fluids 125/hr - Full liquids ID: Day 3 antibiotics- day 2 Zosyn DVT:  Lovenox  Plan:  Decrease IV fluids, Sitz baths and remove packing later today.         LOS: 15 days    Bora Broner 01/23/2016 [phone]

## 2016-01-23 NOTE — Progress Notes (Signed)
I pulled the packing out of both sites and left it open with just the Penrose drain in place.  We will advance her diet. I will just keep an outside dressing on the sites, let her shower and continue the Sitz baths.  Back to a soft diet.  I will let GI work on keeping Stools soft, but not loose.  She will have the penrose drains for some time.

## 2016-01-24 DIAGNOSIS — K523 Indeterminate colitis: Secondary | ICD-10-CM

## 2016-01-24 LAB — CBC WITH DIFFERENTIAL/PLATELET
Basophils Absolute: 0 10*3/uL (ref 0.0–0.1)
Basophils Relative: 0 %
EOS PCT: 0 %
Eosinophils Absolute: 0 10*3/uL (ref 0.0–0.7)
HEMATOCRIT: 31.1 % — AB (ref 36.0–46.0)
Hemoglobin: 10.1 g/dL — ABNORMAL LOW (ref 12.0–15.0)
LYMPHS ABS: 0.3 10*3/uL — AB (ref 0.7–4.0)
Lymphocytes Relative: 4 %
MCH: 28.5 pg (ref 26.0–34.0)
MCHC: 32.5 g/dL (ref 30.0–36.0)
MCV: 87.9 fL (ref 78.0–100.0)
MONOS PCT: 4 %
Monocytes Absolute: 0.3 10*3/uL (ref 0.1–1.0)
NEUTROS ABS: 7.2 10*3/uL (ref 1.7–7.7)
Neutrophils Relative %: 92 %
Platelets: 105 10*3/uL — ABNORMAL LOW (ref 150–400)
RBC: 3.54 MIL/uL — AB (ref 3.87–5.11)
RDW: 15.3 % (ref 11.5–15.5)
WBC: 7.8 10*3/uL (ref 4.0–10.5)

## 2016-01-24 LAB — COMPREHENSIVE METABOLIC PANEL
ALT: 12 U/L — ABNORMAL LOW (ref 14–54)
AST: 10 U/L — AB (ref 15–41)
Albumin: 1.8 g/dL — ABNORMAL LOW (ref 3.5–5.0)
Alkaline Phosphatase: 54 U/L (ref 38–126)
Anion gap: 6 (ref 5–15)
BILIRUBIN TOTAL: 0.3 mg/dL (ref 0.3–1.2)
BUN: 12 mg/dL (ref 6–20)
CALCIUM: 8 mg/dL — AB (ref 8.9–10.3)
CO2: 27 mmol/L (ref 22–32)
CREATININE: 0.56 mg/dL (ref 0.44–1.00)
Chloride: 102 mmol/L (ref 101–111)
GFR calc Af Amer: >60 mL/min (ref >60)
Glucose, Bld: 100 mg/dL — ABNORMAL HIGH (ref 65–99)
POTASSIUM: 4.1 mmol/L (ref 3.5–5.1)
Sodium: 135 mmol/L (ref 135–145)
TOTAL PROTEIN: 4.6 g/dL — AB (ref 6.5–8.1)

## 2016-01-24 LAB — C-REACTIVE PROTEIN: CRP: 17.1 mg/dL — ABNORMAL HIGH (ref <1.0)

## 2016-01-24 NOTE — Progress Notes (Signed)
Patient ID: Sara Barnes, female   DOB: 1940/12/18, 75 y.o.   MRN: [id number]  [facility] Progress Note  2 Days Post-Op  Subjective: Reports less pain over night. Also felt relief with sitz bath yesterday, plans to repeat today. Tolerating diet. Passing gas and had small BM yesterday.  Objective: Vital signs in last 24 hours: Temp:  [97.8 F (36.6 C)-98.3 F (36.8 C)] 97.8 F (36.6 C) (09/28 0513) Pulse Rate:  [68-91] 79 (09/28 0513) Resp:  [18-20] 20 (09/28 0513) BP: (115-144)/(68-93) 144/77 (09/28 0513) SpO2:  [96 %-99 %] 99 % (09/28 0513) Last BM Date: 01/22/16  Intake/Output from previous day: 09/27 0701 - 09/28 0700 In: 2621.3 [P.O.:720; I.V.:1701.3; IV Piggyback:200] Out: -  Intake/Output this shift: No intake/output data recorded.  PE: General appearance: alert, cooperative and no distress Perirectal abscess with penrose drain in place, slow bloody drainage  Lab Results:   Recent Labs  01/23/16 0605 01/24/16 0601  WBC 9.2 7.8  HGB 10.1* 10.1*  HCT 30.3* 31.1*  PLT 118* 105*   BMET  Recent Labs  01/23/16 0605 01/24/16 0601  NA 135 135  K 4.4 4.1  CL 102 102  CO2 26 27  GLUCOSE 101* 100*  BUN 11 12  CREATININE 0.60 0.56  CALCIUM 7.9* 8.0*   PT/INR No results for input(s): LABPROT, INR in the last 72 hours. CMP     Component Value Date/Time   NA 135 01/24/2016 0601   NA 138 12/06/2014 0804   K 4.1 01/24/2016 0601   K 4.1 12/06/2014 0804   CL 102 01/24/2016 0601   CO2 27 01/24/2016 0601   CO2 25 12/06/2014 0804   GLUCOSE 100 (H) 01/24/2016 0601   GLUCOSE 92 12/06/2014 0804   BUN 12 01/24/2016 0601   BUN 12.3 12/06/2014 0804   CREATININE 0.56 01/24/2016 0601   CREATININE 0.8 12/06/2014 0804   CALCIUM 8.0 (L) 01/24/2016 0601   CALCIUM 9.4 12/06/2014 0804   PROT 4.6 (L) 01/24/2016 0601   PROT 6.5 12/06/2014 0804   ALBUMIN 1.8 (L) 01/24/2016 0601   ALBUMIN 3.3 (L) 12/06/2014 0804   AST 10 (L) 01/24/2016 0601   AST 9  12/06/2014 0804   ALT 12 (L) 01/24/2016 0601   ALT 10 12/06/2014 0804   ALKPHOS 54 01/24/2016 0601   ALKPHOS 89 12/06/2014 0804   BILITOT 0.3 01/24/2016 0601   BILITOT 0.37 12/06/2014 0804   GFRNONAA >60 01/24/2016 0601   GFRAA >60 01/24/2016 0601   Lipase     Component Value Date/Time   LIPASE 31 04/15/2011 1700       Studies/Results: No results found.  Anti-infectives: Anti-infectives    Start     Dose/Rate Route Frequency Ordered Stop   01/22/16 0900  piperacillin-tazobactam (ZOSYN) IVPB 3.375 g     3.375 g 12.5 mL/hr over 240 Minutes Intravenous Every 8 hours 01/22/16 0840     01/21/16 1830  ciprofloxacin (CIPRO) IVPB 400 mg  Status:  Discontinued     400 mg 200 mL/hr over 60 Minutes Intravenous Every 12 hours 01/21/16 1744 01/22/16 0840   01/21/16 1830  metroNIDAZOLE (FLAGYL) IVPB 500 mg  Status:  Discontinued     500 mg 100 mL/hr over 60 Minutes Intravenous Every 6 hours 01/21/16 1744 01/22/16 0840       Assessment/Plan S/p Incision, drainage and packing of complex perirectal abscess 01/23/16 Dr. Harlow Asa - POD 1 - packing pulled yesterday, penrose remains in place.  Ulcerative colitis/Crohn's disease treated with  IV steroids and Remicade acutely - per GI continue solu-medrol 65m BID. Would be due Remicade Monday, depending on how she is doing and overall clinical course  ID - zosyn day 3 FEN - soft VTE - lovenox  Plan - continue sitz baths and antibiotics. Appreciate GI assistance with BM management.   LOS: 16 days    BJerrye Beavers, P[facility] 01/24/2016, 8:32 AM Pager: [phone]: [phone]-Fri 7:00 am-4:30 pm Sat-Sun 7:00 am-11:30 am

## 2016-01-24 NOTE — Progress Notes (Signed)
PROGRESS NOTE    Sara Barnes  PJK:[id number] DOB: 1941-03-16 DOA: 01/07/2016 PCP: Ann Held, DO   Chief Complaint  Patient presents with  . Rectal Bleeding    SINCE FRIDAY     Brief Narrative:  HPI on 01/07/2016 by Dr. Gilman Schmidt [name] a 75 y.o.femalewith medical history significant of ulcerative colitis versus Crohn's disease, believed to be UC eventually,otherwise healthy, presents to the emergency room with chief complaint of severe abdominal pain for the past several days. She was recently hospitalized and discharged home about 9 days prior to admission. She was here in the hospital with similar complaints, placed on steroids, and tells me today that she improved some and really wanted to go home. At home, she has had ongoing symptoms, never really well achieved full remission, and over the last few days she has felt extremely unwell. She has crampy abdominal pain, nausea, poor appetite, barely able to keep down some Gatorade, and has been having significant amounts of bloody bowel movements as well. She has no fever or chills. She has no chest pain or shortness of breath. She denies any palpitations. She has no lightheadedness or dizziness. She feels generalized weakness. In the emergency room her vital signs are stable, her blood work is essentially unremarkable. Gastroenterology was consulted by EDP and have evaluated patient, and TRH was asked for admission  Interim history Gastroenterology consultation appreciated. Patient underwent flexible sigmoidoscopy. CMV was negative and she was started on Remicade. She was initially doing better and tolerating some diet, GI following and since still having a lot of pain and frequent BMs will be dosed with another Remicaide infusion on  9/17. 9/21 pain and bloody clots worsened overnight. CT abd/pelvis showed perirectal abscess- General surgery Was consulted. Patient underwent I&D on 9/26. Steroids were decreased per  GI recommendations  Assessment & Plan   Abdominal pain secondary to Ulcerative colitis flare with rectal bleeding/ Perianal abscess -Gastroenterology consulted and is following -s/p flexible sigmoidoscopy on 01/08/16, biopsy with findings of active erosive ulcer. No CMV found. Patient was started on Remicade on 01/09/2016 and 01/13/16. Next dose of Remicade anticipated on 01/28/2016. -Patient remains on ciprofloxacin and Flagyl -Gen. surgery consulted and appreciated, now status post I&D of abscess. -We'll repeat CBC in the morning -Chart reviewed. Plan to start therapy soon  Acute blood loss anemia/Symptomatic anemia -Secondary to the above -hemoglobin 8.6, after 2uPRBCs -Patient was given 2 units appear bases with posttransfusion hemoglobin remains stable -We'll repeat CBC per above  Diarrhea secondary to UC flare -Cdiff negative x2 -Continue supportive care  Depression/Anxiety -Continue prozac  Debility -We'll consult physical therapy and occupational therapy -Suspect patient would benefit from skilled nursing level care when ultimately discharged   DVT Prophylaxis  Lovenox  Code Status: Full  Family Communication: Patient in room, family not at bedside  Disposition Plan:  uncertain at this time, suspect patient would benefit from skilled nursing level of care.  Consultants Gastroenterology  General surgery  Procedures  Flexible sigmoidoscopy Incision and drainage. No abscess on 01/22/2016  Antibiotics   Anti-infectives    Start     Dose/Rate Route Frequency Ordered Stop   01/22/16 0900  piperacillin-tazobactam (ZOSYN) IVPB 3.375 g     3.375 g 12.5 mL/hr over 240 Minutes Intravenous Every 8 hours 01/22/16 0840     01/21/16 1830  ciprofloxacin (CIPRO) IVPB 400 mg  Status:  Discontinued     400 mg 200 mL/hr over 60 Minutes Intravenous Every 12  hours 01/21/16 1744 01/22/16 0840   01/21/16 1830  metroNIDAZOLE (FLAGYL) IVPB 500 mg  Status:  Discontinued      500 mg 100 mL/hr over 60 Minutes Intravenous Every 6 hours 01/21/16 1744 01/22/16 0840      Subjective:   Complains of residual postoperative soreness.  Objective:   Vitals:   01/23/16 0357 01/23/16 1425 01/23/16 2148 01/24/16 0513  BP: 112/69 115/68 (!) 135/93 (!) 144/77  Pulse: 73 68 91 79  Resp: 18 18 20 20   Temp: 97.6 F (36.4 C) 98.3 F (36.8 C) 98.2 F (36.8 C) 97.8 F (36.6 C)  TempSrc: Oral Oral Oral Oral  SpO2: 97% 99% 96% 99%  Weight:      Height:        Intake/Output Summary (Last 24 hours) at 01/24/16 1700 Last data filed at 01/24/16 1500  Gross per 24 hour  Intake          2881.25 ml  Output                0 ml  Net          2881.25 ml   Filed Weights   01/07/16 1136  Weight: 72.6 kg (160 lb)    Exam  General: Patient lying on her left side, awake, conversant  HEENT: Trachea midline, neck supple  Cardiovascular: S1 S2 , regular rate  Respiratory: Clear to auscultation, no wheezing, good breath sounds  Abdomen: Positive bowel sounds, mildly tender on palpation  Extremities: No cyanosis, no clubbing  Neuro: No tremors, no seizures  Psych: Mood and affect appear normal, no auditory or visual hallucinations  Skin: Normal skin turgor, no rashes seen   Data Reviewed: I have personally reviewed following labs and imaging studies  CBC:  Recent Labs Lab 01/20/16 0548 01/21/16 0522 01/21/16 1907 01/22/16 0539 01/23/16 0605 01/24/16 0601  WBC 13.2* 14.2*  --  11.8* 9.2 7.8  NEUTROABS  --   --   --  10.8* 8.3* 7.2  HGB 8.2* 7.6* 10.0* 8.6* 10.1* 10.1*  HCT 24.9* 23.1* 29.9* 25.6* 30.3* 31.1*  MCV 91.9 93.9  --  91.1 89.1 87.9  PLT 252 238  --  165 118* 142*   Basic Metabolic Panel:  Recent Labs Lab 01/19/16 0544 01/21/16 0522 01/22/16 0539 01/23/16 0605 01/24/16 0601  NA 134* 134* 138 135 135  K 4.2 3.8 4.1 4.4 4.1  CL 101 102 107 102 102  CO2 25 25 26 26 27   GLUCOSE 114* 94 105* 101* 100*  BUN 16 23* 16 11 12   CREATININE  0.60 0.84 0.57 0.60 0.56  CALCIUM 8.5* 8.5* 8.2* 7.9* 8.0*   GFR: Estimated Creatinine Clearance: 62.9 mL/min (by C-G formula based on SCr of 0.56 mg/dL). Liver Function Tests:  Recent Labs Lab 01/22/16 0539 01/23/16 0605 01/24/16 0601  AST 12* 11* 10*  ALT 14 13* 12*  ALKPHOS 69 61 54  BILITOT 0.4 0.5 0.3  PROT 4.6* 4.3* 4.6*  ALBUMIN 2.0* 1.8* 1.8*   No results for input(s): LIPASE, AMYLASE in the last 168 hours. No results for input(s): AMMONIA in the last 168 hours. Coagulation Profile: No results for input(s): INR, PROTIME in the last 168 hours. Cardiac Enzymes: No results for input(s): CKTOTAL, CKMB, CKMBINDEX, TROPONINI in the last 168 hours. BNP (last 3 results) No results for input(s): PROBNP in the last 8760 hours. HbA1C: No results for input(s): HGBA1C in the last 72 hours. CBG: No results for input(s): GLUCAP in the last  168 hours. Lipid Profile: No results for input(s): CHOL, HDL, LDLCALC, TRIG, CHOLHDL, LDLDIRECT in the last 72 hours. Thyroid Function Tests: No results for input(s): TSH, T4TOTAL, FREET4, T3FREE, THYROIDAB in the last 72 hours. Anemia Panel: No results for input(s): VITAMINB12, FOLATE, FERRITIN, TIBC, IRON, RETICCTPCT in the last 72 hours. Urine analysis:    Component Value Date/Time   COLORURINE YELLOW 04/19/2015 0838   APPEARANCEUR CLOUDY (A) 04/19/2015 0838   LABSPEC 1.013 04/19/2015 0838   PHURINE 6.0 04/19/2015 0838   GLUCOSEU NEGATIVE 04/19/2015 0838   HGBUR MODERATE (A) 04/19/2015 0838   HGBUR small 12/13/2009 0904   BILIRUBINUR NEGATIVE 04/19/2015 0838   BILIRUBINUR ne 01/30/2015 1543   KETONESUR 40 (A) 04/19/2015 0838   PROTEINUR NEGATIVE 04/19/2015 0838   UROBILINOGEN 0.2 01/30/2015 1543   UROBILINOGEN 0.2 09/25/2013 1350   NITRITE NEGATIVE 04/19/2015 0838   LEUKOCYTESUR TRACE (A) 04/19/2015 0838   Sepsis Labs: @LABRCNTIP (procalcitonin:4,lacticidven:4)  ) Recent Results (from the past 240 hour(s))  C difficile quick  scan w PCR reflex     Status: None   Collection Time: 01/21/16  4:43 PM  Result Value Ref Range Status   C Diff antigen NEGATIVE NEGATIVE Final   C Diff toxin NEGATIVE NEGATIVE Final   C Diff interpretation No C. difficile detected.  Final  MRSA PCR Screening     Status: None   Collection Time: 01/22/16 10:55 AM  Result Value Ref Range Status   MRSA by PCR NEGATIVE NEGATIVE Final    Comment:        The GeneXpert MRSA Assay (FDA approved for NASAL specimens only), is one component of a comprehensive MRSA colonization surveillance program. It is not intended to diagnose MRSA infection nor to guide or monitor treatment for MRSA infections.   Aerobic/Anaerobic Culture (surgical/deep wound)     Status: None (Preliminary result)   Collection Time: 01/22/16  1:17 PM  Result Value Ref Range Status   Specimen Description ABSCESS PERIRECTAL  Final   Special Requests NONE  Final   Gram Stain   Final    FEW WBC PRESENT,BOTH PMN AND MONONUCLEAR MANY GRAM POSITIVE COCCI IN PAIRS AND CHAINS MANY GRAM NEGATIVE RODS MANY GRAM POSITIVE RODS MODERATE GRAM NEGATIVE COCCOBACILLI RARE YEAST Performed at [facility]    Culture   Final    CULTURE REINCUBATED FOR BETTER GROWTH NO ANAEROBES ISOLATED; CULTURE IN PROGRESS FOR 5 DAYS    Report Status PENDING  Incomplete      Radiology Studies: No results found.   Scheduled Meds: . chlorhexidine  15 mL Mouth/Throat BID  . dicyclomine  10 mg Oral TID AC & HS  . enoxaparin (LOVENOX) injection  40 mg Subcutaneous Q24H  . FLUoxetine  20 mg Oral Daily  . hydrocortisone  25 mg Rectal Q0600  . methylPREDNISolone (SOLU-MEDROL) injection  20 mg Intravenous Q12H  . midazolam  2 mg Intravenous Once  . oxymorphone  20 mg Oral QAC breakfast  . oxymorphone  30 mg Oral QHS  . piperacillin-tazobactam (ZOSYN)  IV  3.375 g Intravenous Q8H   Continuous Infusions: . sodium chloride 50 mL/hr at 01/23/16 1101     LOS: 16 days    CHIU, STEPHEN K   MD. on 01/24/2016 at 5:00 PM  Between 7am to 7pm - Pager - [phone]  After 7pm go to [email or web address] - password [email or web address]  And look for the night coverage person covering for me after hours  [facility] Office  [phone]

## 2016-01-24 NOTE — Progress Notes (Signed)
     [facility] Progress Note  Subjective:  Still having pain in her bottom, which is to be expected.  Says that she had a better night though.  Having some stools, incontinence.  Complaining about the nursing assessment this morning that they drain the last of her energy.  Objective:  Vital signs in last 24 hours: Temp:  [97.8 F (36.6 C)-98.3 F (36.8 C)] 97.8 F (36.6 C) (09/28 0513) Pulse Rate:  [68-91] 79 (09/28 0513) Resp:  [18-20] 20 (09/28 0513) BP: (115-144)/(68-93) 144/77 (09/28 0513) SpO2:  [96 %-99 %] 99 % (09/28 0513) Last BM Date: 01/22/16 General:  Alert, Well-developed, in NAD Heart:  Regular rate and rhythm; no murmurs Pulm:  CTAB.  No W/R/R. Abdomen:  Soft, non-distended.  BS present.  B/L lower abdominal TTP. Extremities:  Without edema. Neurologic:  Alert and oriented x 4;  grossly normal neurologically. Psych:  Alert and cooperative. Normal mood and affect.  Intake/Output from previous day: 09/27 0701 - 09/28 0700 In: 2621.3 [P.O.:720; I.V.:1701.3; IV Piggyback:200] Out: -   Lab Results:  Recent Labs  01/22/16 0539 01/23/16 0605 01/24/16 0601  WBC 11.8* 9.2 7.8  HGB 8.6* 10.1* 10.1*  HCT 25.6* 30.3* 31.1*  PLT 165 118* 105*   BMET  Recent Labs  01/22/16 0539 01/23/16 0605 01/24/16 0601  NA 138 135 135  K 4.1 4.4 4.1  CL 107 102 102  CO2 26 26 27   GLUCOSE 105* 101* 100*  BUN 16 11 12   CREATININE 0.57 0.60 0.56  CALCIUM 8.2* 7.9* 8.0*   LFT  Recent Labs  01/24/16 0601  PROT 4.6*  ALBUMIN 1.8*  AST 10*  ALT 12*  ALKPHOS 54  BILITOT 0.3   Assessment / Plan: 1. 75 y.o.femalewith indeterminate colitis, severe rectal ulcers and now large perirectal abscess s/p I&D on 9/26-receiving Remicade for which she is due on 10/2 as long as ok from surgical standpoint.  IV steroids decreased to 20 mg every 12 hours for wound healing.  On Zosyn, IV cipro, and IV flagyl. 2. Anemia:pt received 2 u prbcs 9/25 and 1 unit 9/26.   Hgb stable this AM.  Monitor Hgb and transfuse prn.   LOS: 16 days   Tamario Heal D.  01/24/2016, 9:24 AM  Pager number [phone]

## 2016-01-25 DIAGNOSIS — R262 Difficulty in walking, not elsewhere classified: Secondary | ICD-10-CM

## 2016-01-25 LAB — CBC WITH DIFFERENTIAL/PLATELET
BASOS ABS: 0 10*3/uL (ref 0.0–0.1)
Basophils Relative: 0 %
EOS PCT: 0 %
Eosinophils Absolute: 0 10*3/uL (ref 0.0–0.7)
HEMATOCRIT: 33.2 % — AB (ref 36.0–46.0)
HEMOGLOBIN: 10.8 g/dL — AB (ref 12.0–15.0)
LYMPHS PCT: 6 %
Lymphs Abs: 0.3 10*3/uL — ABNORMAL LOW (ref 0.7–4.0)
MCH: 29.5 pg (ref 26.0–34.0)
MCHC: 32.5 g/dL (ref 30.0–36.0)
MCV: 90.7 fL (ref 78.0–100.0)
MONOS PCT: 4 %
Monocytes Absolute: 0.2 10*3/uL (ref 0.1–1.0)
NEUTROS ABS: 5.2 10*3/uL (ref 1.7–7.7)
Neutrophils Relative %: 90 %
Platelets: 109 10*3/uL — ABNORMAL LOW (ref 150–400)
RBC: 3.66 MIL/uL — ABNORMAL LOW (ref 3.87–5.11)
RDW: 14.9 % (ref 11.5–15.5)
WBC: 5.7 10*3/uL (ref 4.0–10.5)

## 2016-01-25 LAB — COMPREHENSIVE METABOLIC PANEL
ALBUMIN: 1.8 g/dL — AB (ref 3.5–5.0)
ALK PHOS: 57 U/L (ref 38–126)
ALT: 15 U/L (ref 14–54)
ANION GAP: 7 (ref 5–15)
AST: 15 U/L (ref 15–41)
BILIRUBIN TOTAL: 0.4 mg/dL (ref 0.3–1.2)
BUN: 10 mg/dL (ref 6–20)
CALCIUM: 8.1 mg/dL — AB (ref 8.9–10.3)
CO2: 27 mmol/L (ref 22–32)
Chloride: 100 mmol/L — ABNORMAL LOW (ref 101–111)
Creatinine, Ser: 0.46 mg/dL (ref 0.44–1.00)
GFR calc Af Amer: >60 mL/min (ref >60)
GLUCOSE: 93 mg/dL (ref 65–99)
Potassium: 4.6 mmol/L (ref 3.5–5.1)
Sodium: 134 mmol/L — ABNORMAL LOW (ref 135–145)
TOTAL PROTEIN: 4.8 g/dL — AB (ref 6.5–8.1)

## 2016-01-25 LAB — C-REACTIVE PROTEIN: CRP: 8.5 mg/dL — AB (ref <1.0)

## 2016-01-25 MED ORDER — OXYMORPHONE HCL ER 10 MG PO T12A
20.0000 mg | EXTENDED_RELEASE_TABLET | Freq: Every day | ORAL | Status: DC
  Administered 2016-01-25 – 2016-01-26 (×2): 20 mg via ORAL
  Filled 2016-01-25 (×2): qty 2

## 2016-01-25 MED ORDER — OXYMORPHONE HCL ER 10 MG PO T12A
10.0000 mg | EXTENDED_RELEASE_TABLET | Freq: Every day | ORAL | Status: DC
  Administered 2016-01-26 – 2016-02-14 (×20): 10 mg via ORAL
  Filled 2016-01-25 (×21): qty 1

## 2016-01-25 NOTE — Clinical Social Work Note (Signed)
Clinical Social Work Assessment  Patient Details  Name: Sara Barnes MRN: [id number] Date of Birth: 12/21/1940  Date of referral:  01/25/16               Reason for consult:  Facility Placement                Permission sought to share information with:  Family Supports Permission granted to share information::     Name::        Agency::     Relationship::  Sister  Contact Information:  Shary Decamp: [phone]  Housing/Transportation Living arrangements for the past 2 months:  Single Family Home Source of Information:  Patient, Other (Comment Required) (Sister) Patient Interpreter Needed:  None Criminal Activity/Legal Involvement Pertinent to Current Situation/Hospitalization:  No - Comment as needed Significant Relationships:  Adult Children, Siblings Lives with:  Self Do you feel safe going back to the place where you live?  Yes Need for family participation in patient care:  Yes   Care giving concerns:  Patient and sister are concerned with patient ability to care for herself at home once discharged form the hospital. The patient understands she has not walked in three weeks and will need physical therapy before going home. The patient continues to have medical work up, but would like to start thinking about care after the hospital.    Social Worker assessment / plan:  LCSWA met with patient and sister at bedside. Patient is agreeable to SNF in [place]  that will be covered by her insurance.  LCSWA will assist patient with disposition to SNF/Physical Therapy is pending at this time.   Employment status:  Retired Nurse, adult PT Recommendations:  Not assessed at this time Information / Referral to community resources:  [place]  Patient/Family's Response to care:  Agreeable and responding to medical care.   Patient/Family's Understanding of and Emotional Response to Diagnosis, Current Treatment, and Prognosis: " I feel just  little better today." Patient and sister understand to get patient back to baseline will be a process.  Emotional Assessment Appearance:  Appears stated age Attitude/Demeanor/Rapport:    Affect (typically observed):  Accepting, Calm Orientation:  Oriented to Self, Oriented to Place, Oriented to  Time, Oriented to Situation Alcohol / Substance use:  Not Applicable Psych involvement (Current and /or in the community):  No (Comment)  Discharge Needs  Concerns to be addressed:  Discharge Planning Concerns, Care Coordination Readmission within the last 30 days:  Yes Current discharge risk:  Dependent with Mobility Barriers to Discharge:  Continued Medical Work up   [employer], [place] 01/25/2016, 12:25 PM

## 2016-01-25 NOTE — Progress Notes (Signed)
OT Cancellation Note  Patient Details Name: Sara Barnes MRN: [id number] DOB: 02-Sep-1940   Cancelled Treatment:    Reason Eval/Treat Not Completed: Fatigue/lethargy limiting ability to participate.  Pt feels too worn out to even get to EOB.  We will check back over the weekend.  Mukund Weinreb 01/25/2016, 1:53 PM  Lesle Chris, OTR/L [phone] 01/25/2016

## 2016-01-25 NOTE — Evaluation (Signed)
Physical Therapy Evaluation Patient Details Name: Sara Barnes MRN: [id number] DOB: 02/14/1941 Today's Date: 01/25/2016   History of Present Illness  75 y.o. female admitted with abdominal pain, bloody BMs. Dx of ulcerative colitis. Pt had recent hospitalization with similar symptoms, DCed home 12/29/15. PMH of breast cancer.   Clinical Impression  Pt admitted with above diagnosis. Pt currently with functional limitations due to the deficits listed below (see PT Problem List). Min A for transfers and to ambulate 44' with RW. Pt has had 2 falls during this hospitalization and lives alone, she would benefit from ST-SNF. Pt will benefit from skilled PT to increase their independence and safety with mobility to allow discharge to the venue listed below.       Follow Up Recommendations SNF;Supervision/Assistance - 24 hour    Equipment Recommendations  Rolling walker with 5" wheels;3in1 (PT)    Recommendations for Other Services OT consult     Precautions / Restrictions Precautions Precautions: Fall Precaution Comments: has fallen x 2 in hospital, pt denies prior falls at home; rectal drain Restrictions Weight Bearing Restrictions: No      Mobility  Bed Mobility               General bed mobility comments: NT- up on EOB  Transfers Overall transfer level: Needs assistance Equipment used: Rolling walker (2 wheeled) Transfers: Sit to/from [facility] Sit to Stand: Min assist Stand pivot transfers: Min assist       General transfer comment: VCs hand placement, Min A to rise and steady  Ambulation/Gait Ambulation/Gait assistance: Min guard Ambulation Distance (Feet): 80 Feet Assistive device: Rolling walker (2 wheeled) Gait Pattern/deviations: Step-through pattern;Decreased step length - right;Decreased step length - left Gait velocity: decr Gait velocity interpretation: Below normal speed for age/gender General Gait Details: distance limited by fatigue,  no LOB  Stairs            Wheelchair Mobility    Modified Rankin (Stroke Patients Only)       Balance Overall balance assessment: History of Falls;Needs assistance   Sitting balance-Leahy Scale: Good       Standing balance-Leahy Scale: Fair                               Pertinent Vitals/Pain Pain Assessment: Faces Faces Pain Scale: Hurts even more Pain Location: rectum Pain Descriptors / Indicators: Sore Pain Intervention(s): Limited activity within patient's tolerance;Monitored during session;Premedicated before session    Home Living Family/patient expects to be discharged to:: Skilled nursing facility Living Arrangements: Alone Available Help at Discharge: Family;Available PRN/intermittently           Home Equipment: None      Prior Function Level of Independence: Independent               Hand Dominance        Extremity/Trunk Assessment   Upper Extremity Assessment: Overall WFL for tasks assessed           Lower Extremity Assessment: Overall WFL for tasks assessed      Cervical / Trunk Assessment: Normal  Communication   Communication: HOH  Cognition Arousal/Alertness: Awake/alert Behavior During Therapy: WFL for tasks assessed/performed Overall Cognitive Status: Impaired/Different from baseline Area of Impairment: Memory               General Comments: sister reports pt is mildly confused from the pain medication; pt is able to follow directions and is  oriented to self, situation, location; mild memory deficit    General Comments      Exercises     Assessment/Plan    PT Assessment Patient needs continued PT services  PT Problem List Decreased activity tolerance;Decreased mobility;Pain;Decreased knowledge of use of DME;Decreased balance;Decreased safety awareness          PT Treatment Interventions DME instruction;Gait training;Functional mobility training;Therapeutic activities;Therapeutic  exercise;Balance training;Patient/family education    PT Goals (Current goals can be found in the Care Plan section)  Acute Rehab PT Goals Patient Stated Goal: to be able to cook and garden PT Goal Formulation: With patient/family Time For Goal Achievement: 02/08/16 Potential to Achieve Goals: Good    Frequency Min 3X/week   Barriers to discharge        Co-evaluation               End of Session Equipment Utilized During Treatment: Gait belt Activity Tolerance: Patient limited by fatigue Patient left: Other (comment);with nursing/sitter in room;with family/visitor present;with call bell/phone within reach (on commode) Nurse Communication: Mobility status         Time: 5038-8828 PT Time Calculation (min) (ACUTE ONLY): 20 min   Charges:   PT Evaluation $PT Eval Low Complexity: 1 Procedure     PT G Codes:        Philomena Doheny 01/25/2016, 1:27 PM [phone]

## 2016-01-25 NOTE — NC FL2 (Signed)
[place] LEVEL OF CARE SCREENING TOOL     IDENTIFICATION  Patient Name: Sara Barnes Birthdate: Jul 15, 1940 Sex: female Admission Date (Current Location): 01/07/2016  [facility] and [place] Number:  Herbalist and Address:  [facility],  [address] [place], [place]      Provider Number: [id number]  Attending Physician Name and Address:  Donne Hazel, MD  Relative Name and Phone Number:       Current Level of Care: Hospital Recommended Level of Care: [place] Prior Approval Number:    Date Approved/Denied:   PASRR Number:    Discharge Plan: SNF    Current Diagnoses: Patient Active Problem List   Diagnosis Date Noted  . Acute blood loss anemia   . Indeterminate colitis   . Absolute anemia   . Left upper quadrant pain   . Abdominal pain 01/07/2016  . GI bleed 01/07/2016  . IBS (irritable bowel syndrome)   . [place]   . Ulcerative pancolitis with rectal bleeding ([place])   . Diarrhea 12/24/2015  . Crohn disease ([place]) 12/24/2015  . Abscess of anal or rectal region   . Peri-rectal abscess   . Dehydration   . Ulcerative pancolitis with abscess ([place])   . Sepsis ([place]) 04/19/2015  . Rectal pain   . Leucocytosis   . Rectal bleeding 04/18/2015  . Ulcerative colitis with rectal bleeding ([place]) 04/18/2015  . Cancer of lower-inner quadrant of left female breast ([place]) 11/30/2014  . Depression 08/12/2006    Orientation RESPIRATION BLADDER Height & Weight     Self, Time, Situation, Place  Normal Continent Weight: 160 lb (72.6 kg) Height:  5' 6"  (167.6 cm)  BEHAVIORAL SYMPTOMS/MOOD NEUROLOGICAL BOWEL NUTRITION STATUS      Incontinent  (Soft Diet)  AMBULATORY STATUS COMMUNICATION OF NEEDS Skin   Limited Assist Verbally Normal                       Personal Care Assistance Level of Assistance  Bathing, Feeding, Dressing Bathing Assistance: Limited assistance Feeding assistance: Independent Dressing  Assistance: Limited assistance     Functional Limitations Info  Sight, Hearing, Speech Sight Info: Adequate Hearing Info: Adequate Speech Info: Adequate    SPECIAL CARE FACTORS FREQUENCY  PT (By licensed PT), OT (By licensed OT)     PT Frequency: 5 OT Frequency: 5            Contractures Contractures Info: Present    Additional Factors Info  Code Status, Allergies Code Status Info: FullCode Allergies Info: Codeine, Mesalamine, Other, Oxycodone-acetaminophen, Sulfa Antibiotics           Current Medications (01/25/2016):  This is the current hospital active medication list Current Facility-Administered Medications  Medication Dose Route Frequency Provider Last Rate Last Dose  . 0.9 %  sodium chloride infusion   Intravenous Continuous Earnstine Regal, PA-C 50 mL/hr at 01/23/16 1101    . chlorhexidine (PERIDEX) 0.12 % solution 15 mL  15 mL Mouth/Throat BID Caren Griffins, MD   15 mL at 01/24/16 2200  . dicyclomine (BENTYL) capsule 10 mg  10 mg Oral TID AC & HS Amy S Esterwood, PA-C   10 mg at 01/25/16 1357  . enoxaparin (LOVENOX) injection 40 mg  40 mg Subcutaneous Q24H [name], MD   40 mg at 01/24/16 1739  . FLUoxetine (PROZAC) capsule 20 mg  20 mg Oral Daily Maryann Mikhail, DO   20 mg at 01/25/16  1029  . fluticasone (FLONASE) 50 MCG/ACT nasal spray 2 spray  2 spray Each Nare Daily PRN Maryann Mikhail, DO      . HYDROcodone-acetaminophen (NORCO/VICODIN) 5-325 MG per tablet 2 tablet  2 tablet Oral Q3H PRN Hosie Poisson, MD   2 tablet at 01/25/16 1029  . hydrocortisone (ANUSOL-HC) suppository 25 mg  25 mg Rectal Q0600 Milus Banister, MD   25 mg at 01/23/16 1102  . HYDROmorphone (DILAUDID) injection 1 mg  1 mg Intravenous Q2H PRN Hosie Poisson, MD   1 mg at 01/25/16 1357  . methylPREDNISolone sodium succinate (SOLU-MEDROL) 40 mg/mL injection 20 mg  20 mg Intravenous Q12H Jerene Bears, MD   20 mg at 01/25/16 1440  . midazolam (VERSED) injection 2 mg  2 mg  Intravenous Once Earnstine Regal, PA-C      . ondansetron [facility]) injection 4 mg  4 mg Intravenous Q6H PRN Amy S Esterwood, PA-C   4 mg at 01/19/16 2145  . [START ON 01/26/2016] oxymorphone (OPANA ER) 12 hr tablet 10 mg  10 mg Oral QAC breakfast Jerene Bears, MD      . oxymorphone (OPANA ER) 12 hr tablet 20 mg  20 mg Oral QHS Jerene Bears, MD      . piperacillin-tazobactam (ZOSYN) IVPB 3.375 g  3.375 g Intravenous Q8H Earnstine Regal, PA-C   3.375 g at 01/25/16 1440  . zolpidem (AMBIEN) tablet 5 mg  5 mg Oral QHS PRN Dionne Milo, NP   5 mg at 01/24/16 2100     Discharge Medications: Please see discharge summary for a list of discharge medications.  Relevant Imaging Results:  Relevant Lab Results:   Additional Information ss#[phone]  Lia Hopping, LCSW

## 2016-01-25 NOTE — Progress Notes (Signed)
OT Cancellation Note  Patient Details Name: Sara Barnes MRN: [id number] DOB: [birth year]   Cancelled Treatment:    Reason Eval/Treat Not Completed: Other (comment).  Pt is finishing with PT and getting ready for a sitz bath.  Will check back later.    Samik Balkcom 01/25/2016, 1:08 PM  Lesle Chris, OTR/L [phone] 01/25/2016

## 2016-01-25 NOTE — Progress Notes (Signed)
     [facility] Progress Note  Subjective:  Feeling a little better each day.  Seems a little more optimistic today.  PT going to see her today.  Objective:  Vital signs in last 24 hours: Temp:  [98.1 F (36.7 C)-98.3 F (36.8 C)] 98.1 F (36.7 C) (09/29 9702) Pulse Rate:  [58-67] 58 (09/29 0638) Resp:  [19-20] 19 (09/29 6378) BP: (131-155)/(79) 155/79 (09/29 5885) SpO2:  [98 %-100 %] 98 % (09/29 0277) Last BM Date: 01/22/16 General:  Alert, Well-developed, in NAD Heart:  Regular rate and rhythm; no murmurs Pulm:  CTAB.  No W/R/R. Abdomen:  Soft, non-distended.  BS present.  Lower abdominal TTP. Extremities:  Without edema. Neurologic:  Alert and oriented x 4;  grossly normal neurologically. Psych:  Alert and cooperative. Normal mood and affect.  Intake/Output from previous day: 09/28 0701 - 09/29 0700 In: 1590 [P.O.:240; I.V.:1200; IV Piggyback:150] Out: -  Intake/Output this shift: Total I/O In: 240 [P.O.:240] Out: 2 [Urine:1; Stool:1]  Lab Results:  Recent Labs  01/23/16 0605 01/24/16 0601 01/25/16 0607  WBC 9.2 7.8 5.7  HGB 10.1* 10.1* 10.8*  HCT 30.3* 31.1* 33.2*  PLT 118* 105* 109*   BMET  Recent Labs  01/23/16 0605 01/24/16 0601 01/25/16 0607  NA 135 135 134*  K 4.4 4.1 4.6  CL 102 102 100*  CO2 26 27 27   GLUCOSE 101* 100* 93  BUN 11 12 10   CREATININE 0.60 0.56 0.46  CALCIUM 7.9* 8.0* 8.1*   LFT  Recent Labs  01/25/16 0607  PROT 4.8*  ALBUMIN 1.8*  AST 15  ALT 15  ALKPHOS 57  BILITOT 0.4   Assessment / Plan: 1. 75 y.o.femalewith indeterminate colitis, severe rectal ulcers and now large perirectal abscess s/p I&D on 9/26-receiving Remicade for which she is due on 10/2 as long as ok from surgical standpoint. IV steroids decreased to 20 mg every 12 hours for wound healing. On Zosyn.  CRP trending down again. 2. Anemia:pt received 2 u prbcs 9/25 and 1 unit 9/26. Hgb stable this AM. Monitor Hgb and transfuse prn.   LOS: 17 days   Beckett Maden D.  01/25/2016, 9:36 AM  Pager number [phone]

## 2016-01-25 NOTE — Progress Notes (Signed)
PROGRESS NOTE    Sara Barnes  TMH:[id number] DOB: Jun 03, 1940 DOA: 01/07/2016 PCP: Ann Held, DO   Chief Complaint  Patient presents with  . Rectal Bleeding    SINCE FRIDAY     Brief Narrative:  HPI on 01/07/2016 by Dr. Gilman Schmidt [name] a 75 y.o.femalewith medical history significant of ulcerative colitis versus Crohn's disease, believed to be UC eventually,otherwise healthy, presents to the emergency room with chief complaint of severe abdominal pain for the past several days. She was recently hospitalized and discharged home about 9 days prior to admission. She was here in the hospital with similar complaints, placed on steroids, and tells me today that she improved some and really wanted to go home. At home, she has had ongoing symptoms, never really well achieved full remission, and over the last few days she has felt extremely unwell. She has crampy abdominal pain, nausea, poor appetite, barely able to keep down some Gatorade, and has been having significant amounts of bloody bowel movements as well. She has no fever or chills. She has no chest pain or shortness of breath. She denies any palpitations. She has no lightheadedness or dizziness. She feels generalized weakness. In the emergency room her vital signs are stable, her blood work is essentially unremarkable. Gastroenterology was consulted by EDP and have evaluated patient, and TRH was asked for admission  Interim history Gastroenterology consultation appreciated. Patient underwent flexible sigmoidoscopy. CMV was negative and she was started on Remicade. She was initially doing better and tolerating some diet, GI following and since still having a lot of pain and frequent BMs will be dosed with another Remicaide infusion on  9/17. 9/21 pain and bloody clots worsened overnight. CT abd/pelvis showed perirectal abscess- General surgery Was consulted. Patient underwent I&D on 9/26. Steroids were decreased per  GI recommendations  Assessment & Plan   Abdominal pain secondary to Ulcerative colitis flare with rectal bleeding/ Perianal abscess -Gastroenterology consulted, following Surgery following -s/p flexible sigmoidoscopy on 01/08/16, biopsy with findings of active erosive ulcer. No CMV found. Patient was started on Remicade on 01/09/2016 and 01/13/16. Next dose of Remicade is anticipated on 01/28/2016. -Patient continues on zosyn as of 9/26. Was on cipro/flagyl prior -Gen. surgery consulted and appreciated, now status post I&D of abscess. -Steroids decreased to 59m AM, 241mpm -PT and OT consulted. Thus far recs for SNF  Acute blood loss anemia/Symptomatic anemia -Secondary to the above -hemoglobin stable at 10.8 -Repeat CBC in AM  Diarrhea secondary to UC flare -Cdiff negative x2 -Will continue supportive care  Depression/Anxiety -Continued prozac  Debility -PT/OT consulted -Recs for SNF   DVT Prophylaxis  Lovenox  Code Status: Full  Family Communication: Patient in room, family not at bedside  Disposition Plan:  uncertain at this time, plan snf  Consultants Gastroenterology  General surgery  Procedures  Flexible sigmoidoscopy Incision and drainage. No abscess on 01/22/2016  Antibiotics   Anti-infectives    Start     Dose/Rate Route Frequency Ordered Stop   01/22/16 0900  piperacillin-tazobactam (ZOSYN) IVPB 3.375 g     3.375 g 12.5 mL/hr over 240 Minutes Intravenous Every 8 hours 01/22/16 0840     01/21/16 1830  ciprofloxacin (CIPRO) IVPB 400 mg  Status:  Discontinued     400 mg 200 mL/hr over 60 Minutes Intravenous Every 12 hours 01/21/16 1744 01/22/16 0840   01/21/16 1830  metroNIDAZOLE (FLAGYL) IVPB 500 mg  Status:  Discontinued     500 mg  100 mL/hr over 60 Minutes Intravenous Every 6 hours 01/21/16 1744 01/22/16 0840      Subjective:   Improving. No complaints  Objective:   Vitals:   01/24/16 0513 01/24/16 2058 01/25/16 0638 01/25/16 1408  BP:  (!) 144/77 131/79 (!) 155/79 122/71  Pulse: 79 67 (!) 58 66  Resp: 20 20 19 20   Temp: 97.8 F (36.6 C) 98.3 F (36.8 C) 98.1 F (36.7 C) 98.6 F (37 C)  TempSrc: Oral Oral Oral Oral  SpO2: 99% 100% 98% 95%  Weight:      Height:        Intake/Output Summary (Last 24 hours) at 01/25/16 1734 Last data filed at 01/25/16 1200  Gross per 24 hour  Intake             1330 ml  Output                0 ml  Net             1330 ml   Filed Weights   01/07/16 1136  Weight: 72.6 kg (160 lb)    Exam  General: ambulating in hallway, in nad  HEENT: Trachea midline, PERRL  Cardiovascular: S1 S2 , regular rhythm  Respiratory: Good chest rise, no crackles  Abdomen: normal bowel sounds, no masses  Extremities: Perfused, no joint deformities  Neuro: cn2-12 grossly intact, strength intact  Psych: normal mood and affect, no auditory hallucinations  Skin: no abnormal skin lesions, normal skin turgor   Data Reviewed: I have personally reviewed following labs and imaging studies  CBC:  Recent Labs Lab 01/21/16 0522 01/21/16 1907 01/22/16 0539 01/23/16 0605 01/24/16 0601 01/25/16 0607  WBC 14.2*  --  11.8* 9.2 7.8 5.7  NEUTROABS  --   --  10.8* 8.3* 7.2 5.2  HGB 7.6* 10.0* 8.6* 10.1* 10.1* 10.8*  HCT 23.1* 29.9* 25.6* 30.3* 31.1* 33.2*  MCV 93.9  --  91.1 89.1 87.9 90.7  PLT 238  --  165 118* 105* 948*   Basic Metabolic Panel:  Recent Labs Lab 01/21/16 0522 01/22/16 0539 01/23/16 0605 01/24/16 0601 01/25/16 0607  NA 134* 138 135 135 134*  K 3.8 4.1 4.4 4.1 4.6  CL 102 107 102 102 100*  CO2 25 26 26 27 27   GLUCOSE 94 105* 101* 100* 93  BUN 23* 16 11 12 10   CREATININE 0.84 0.57 0.60 0.56 0.46  CALCIUM 8.5* 8.2* 7.9* 8.0* 8.1*   GFR: Estimated Creatinine Clearance: 62.9 mL/min (by C-G formula based on SCr of 0.46 mg/dL). Liver Function Tests:  Recent Labs Lab 01/22/16 0539 01/23/16 0605 01/24/16 0601 01/25/16 0607  AST 12* 11* 10* 15  ALT 14 13* 12* 15    ALKPHOS 69 61 54 57  BILITOT 0.4 0.5 0.3 0.4  PROT 4.6* 4.3* 4.6* 4.8*  ALBUMIN 2.0* 1.8* 1.8* 1.8*   No results for input(s): LIPASE, AMYLASE in the last 168 hours. No results for input(s): AMMONIA in the last 168 hours. Coagulation Profile: No results for input(s): INR, PROTIME in the last 168 hours. Cardiac Enzymes: No results for input(s): CKTOTAL, CKMB, CKMBINDEX, TROPONINI in the last 168 hours. BNP (last 3 results) No results for input(s): PROBNP in the last 8760 hours. HbA1C: No results for input(s): HGBA1C in the last 72 hours. CBG: No results for input(s): GLUCAP in the last 168 hours. Lipid Profile: No results for input(s): CHOL, HDL, LDLCALC, TRIG, CHOLHDL, LDLDIRECT in the last 72 hours. Thyroid Function Tests: No  results for input(s): TSH, T4TOTAL, FREET4, T3FREE, THYROIDAB in the last 72 hours. Anemia Panel: No results for input(s): VITAMINB12, FOLATE, FERRITIN, TIBC, IRON, RETICCTPCT in the last 72 hours. Urine analysis:    Component Value Date/Time   COLORURINE YELLOW 04/19/2015 0838   APPEARANCEUR CLOUDY (A) 04/19/2015 0838   LABSPEC 1.013 04/19/2015 0838   PHURINE 6.0 04/19/2015 0838   GLUCOSEU NEGATIVE 04/19/2015 0838   HGBUR MODERATE (A) 04/19/2015 0838   HGBUR small 12/13/2009 0904   BILIRUBINUR NEGATIVE 04/19/2015 0838   BILIRUBINUR ne 01/30/2015 1543   KETONESUR 40 (A) 04/19/2015 0838   PROTEINUR NEGATIVE 04/19/2015 0838   UROBILINOGEN 0.2 01/30/2015 1543   UROBILINOGEN 0.2 09/25/2013 1350   NITRITE NEGATIVE 04/19/2015 0838   LEUKOCYTESUR TRACE (A) 04/19/2015 0838   Sepsis Labs: @LABRCNTIP (procalcitonin:4,lacticidven:4)  ) Recent Results (from the past 240 hour(s))  C difficile quick scan w PCR reflex     Status: None   Collection Time: 01/21/16  4:43 PM  Result Value Ref Range Status   C Diff antigen NEGATIVE NEGATIVE Final   C Diff toxin NEGATIVE NEGATIVE Final   C Diff interpretation No C. difficile detected.  Final  MRSA PCR Screening      Status: None   Collection Time: 01/22/16 10:55 AM  Result Value Ref Range Status   MRSA by PCR NEGATIVE NEGATIVE Final    Comment:        The GeneXpert MRSA Assay (FDA approved for NASAL specimens only), is one component of a comprehensive MRSA colonization surveillance program. It is not intended to diagnose MRSA infection nor to guide or monitor treatment for MRSA infections.   Aerobic/Anaerobic Culture (surgical/deep wound)     Status: None (Preliminary result)   Collection Time: 01/22/16  1:17 PM  Result Value Ref Range Status   Specimen Description ABSCESS PERIRECTAL  Final   Special Requests NONE  Final   Gram Stain   Final    FEW WBC PRESENT,BOTH PMN AND MONONUCLEAR MANY GRAM POSITIVE COCCI IN PAIRS AND CHAINS MANY GRAM NEGATIVE RODS MANY GRAM POSITIVE RODS MODERATE GRAM NEGATIVE COCCOBACILLI RARE YEAST Performed at [facility]    Culture   Final    MULTIPLE ORGANISMS PRESENT, NONE PREDOMINANT NO ANAEROBES ISOLATED; CULTURE IN PROGRESS FOR 5 DAYS    Report Status PENDING  Incomplete      Radiology Studies: No results found.   Scheduled Meds: . chlorhexidine  15 mL Mouth/Throat BID  . dicyclomine  10 mg Oral TID AC & HS  . enoxaparin (LOVENOX) injection  40 mg Subcutaneous Q24H  . FLUoxetine  20 mg Oral Daily  . hydrocortisone  25 mg Rectal Q0600  . methylPREDNISolone (SOLU-MEDROL) injection  20 mg Intravenous Q12H  . midazolam  2 mg Intravenous Once  . [START ON 01/26/2016] oxymorphone  10 mg Oral QAC breakfast  . oxymorphone  20 mg Oral QHS  . piperacillin-tazobactam (ZOSYN)  IV  3.375 g Intravenous Q8H   Continuous Infusions: . sodium chloride 50 mL/hr at 01/23/16 1101     LOS: 17 days    CHIU, STEPHEN K  MD. on 01/25/2016 at 5:34 PM  Between 7am to 7pm - Pager - [phone]  After 7pm go to [email or web address] - password [email or web address]  And look for the night coverage person covering for me after hours  [facility] Office   [phone]

## 2016-01-25 NOTE — Progress Notes (Signed)
Patient ID: Sara Barnes, female   DOB: 09-May-1940, 75 y.o.   MRN: [id number]  [facility] Progress Note  3 Days Post-Op  Subjective: Slept well. Was able to clean up well yesterday with help of sister. Plans to use sitz bath today. Tolerating diet and having small, daily BM's that are a mixture of loose/firm.  Objective: Vital signs in last 24 hours: Temp:  [98.1 F (36.7 C)-98.3 F (36.8 C)] 98.1 F (36.7 C) (09/29 3428) Pulse Rate:  [58-67] 58 (09/29 0638) Resp:  [19-20] 19 (09/29 7681) BP: (131-155)/(79) 155/79 (09/29 1572) SpO2:  [98 %-100 %] 98 % (09/29 6203) Last BM Date: 01/22/16  Intake/Output from previous day: 09/28 0701 - 09/29 0700 In: 1590 [P.O.:240; I.V.:1200; IV Piggyback:150] Out: -  Intake/Output this shift: No intake/output data recorded.  PE: General appearance: alert, cooperative and no distress Perirectal abscess with penrose drain in place, slow bloody drainage, stool on bed pad  Lab Results:   Recent Labs  01/24/16 0601 01/25/16 0607  WBC 7.8 5.7  HGB 10.1* 10.8*  HCT 31.1* 33.2*  PLT 105* 109*   BMET  Recent Labs  01/24/16 0601 01/25/16 0607  NA 135 134*  K 4.1 4.6  CL 102 100*  CO2 27 27  GLUCOSE 100* 93  BUN 12 10  CREATININE 0.56 0.46  CALCIUM 8.0* 8.1*   PT/INR No results for input(s): LABPROT, INR in the last 72 hours. CMP     Component Value Date/Time   NA 134 (L) 01/25/2016 0607   NA 138 12/06/2014 0804   K 4.6 01/25/2016 0607   K 4.1 12/06/2014 0804   CL 100 (L) 01/25/2016 0607   CO2 27 01/25/2016 0607   CO2 25 12/06/2014 0804   GLUCOSE 93 01/25/2016 0607   GLUCOSE 92 12/06/2014 0804   BUN 10 01/25/2016 0607   BUN 12.3 12/06/2014 0804   CREATININE 0.46 01/25/2016 0607   CREATININE 0.8 12/06/2014 0804   CALCIUM 8.1 (L) 01/25/2016 0607   CALCIUM 9.4 12/06/2014 0804   PROT 4.8 (L) 01/25/2016 0607   PROT 6.5 12/06/2014 0804   ALBUMIN 1.8 (L) 01/25/2016 0607   ALBUMIN 3.3 (L) 12/06/2014 0804   AST 15 01/25/2016 0607   AST 9 12/06/2014 0804   ALT 15 01/25/2016 0607   ALT 10 12/06/2014 0804   ALKPHOS 57 01/25/2016 0607   ALKPHOS 89 12/06/2014 0804   BILITOT 0.4 01/25/2016 0607   BILITOT 0.37 12/06/2014 0804   GFRNONAA >60 01/25/2016 0607   GFRAA >60 01/25/2016 0607   Lipase     Component Value Date/Time   LIPASE 31 04/15/2011 1700       Studies/Results: No results found.  Anti-infectives: Anti-infectives    Start     Dose/Rate Route Frequency Ordered Stop   01/22/16 0900  piperacillin-tazobactam (ZOSYN) IVPB 3.375 g     3.375 g 12.5 mL/hr over 240 Minutes Intravenous Every 8 hours 01/22/16 0840     01/21/16 1830  ciprofloxacin (CIPRO) IVPB 400 mg  Status:  Discontinued     400 mg 200 mL/hr over 60 Minutes Intravenous Every 12 hours 01/21/16 1744 01/22/16 0840   01/21/16 1830  metroNIDAZOLE (FLAGYL) IVPB 500 mg  Status:  Discontinued     500 mg 100 mL/hr over 60 Minutes Intravenous Every 6 hours 01/21/16 1744 01/22/16 0840       Assessment/Plan S/p Incision, drainage and packing of complex perirectal abscess 01/22/16 Dr. Harlow Asa - POD 3 - continue local wound care,  penrose remains in place, sitz baths Ulcerative colitis/Crohn's disease treated with IV steroids and Remicade acutely - per GI continue IV steroids and plan Remicade Monday  ID - zosyn day 4 FEN - soft VTE - lovenox  Plan - continue local wound care, sitz baths, penrose drain, and antibiotics. Will continue to follow.   LOS: 17 days    Jerrye Beavers , [facility] 01/25/2016, 8:54 AM Pager: [phone] Consults: [phone] Mon-Fri 7:00 am-4:30 pm Sat-Sun 7:00 am-11:30 am

## 2016-01-26 DIAGNOSIS — F0631 Mood disorder due to known physiological condition with depressive features: Secondary | ICD-10-CM

## 2016-01-26 DIAGNOSIS — F329 Major depressive disorder, single episode, unspecified: Secondary | ICD-10-CM

## 2016-01-26 LAB — CBC WITH DIFFERENTIAL/PLATELET
BASOS ABS: 0 10*3/uL (ref 0.0–0.1)
BASOS PCT: 0 %
EOS ABS: 0 10*3/uL (ref 0.0–0.7)
EOS PCT: 0 %
HCT: 33.9 % — ABNORMAL LOW (ref 36.0–46.0)
Hemoglobin: 11.5 g/dL — ABNORMAL LOW (ref 12.0–15.0)
Lymphocytes Relative: 10 %
Lymphs Abs: 0.5 10*3/uL — ABNORMAL LOW (ref 0.7–4.0)
MCH: 29.7 pg (ref 26.0–34.0)
MCHC: 33.9 g/dL (ref 30.0–36.0)
MCV: 87.6 fL (ref 78.0–100.0)
Monocytes Absolute: 0.2 10*3/uL (ref 0.1–1.0)
Monocytes Relative: 4 %
Neutro Abs: 4.5 10*3/uL (ref 1.7–7.7)
Neutrophils Relative %: 86 %
PLATELETS: 96 10*3/uL — AB (ref 150–400)
RBC: 3.87 MIL/uL (ref 3.87–5.11)
RDW: 14.4 % (ref 11.5–15.5)
WBC: 5.2 10*3/uL (ref 4.0–10.5)

## 2016-01-26 LAB — COMPREHENSIVE METABOLIC PANEL
ALT: 21 U/L (ref 14–54)
AST: 22 U/L (ref 15–41)
Albumin: 2 g/dL — ABNORMAL LOW (ref 3.5–5.0)
Alkaline Phosphatase: 62 U/L (ref 38–126)
Anion gap: 7 (ref 5–15)
BUN: 8 mg/dL (ref 6–20)
CHLORIDE: 98 mmol/L — AB (ref 101–111)
CO2: 30 mmol/L (ref 22–32)
CREATININE: 0.44 mg/dL (ref 0.44–1.00)
Calcium: 8 mg/dL — ABNORMAL LOW (ref 8.9–10.3)
GFR calc Af Amer: >60 mL/min (ref >60)
GFR calc non Af Amer: >60 mL/min (ref >60)
Glucose, Bld: 91 mg/dL (ref 65–99)
POTASSIUM: 4.2 mmol/L (ref 3.5–5.1)
SODIUM: 135 mmol/L (ref 135–145)
Total Bilirubin: 0.4 mg/dL (ref 0.3–1.2)
Total Protein: 5 g/dL — ABNORMAL LOW (ref 6.5–8.1)

## 2016-01-26 LAB — C-REACTIVE PROTEIN: CRP: 4.6 mg/dL — AB (ref <1.0)

## 2016-01-26 MED ORDER — FLUOXETINE HCL 20 MG PO CAPS
40.0000 mg | ORAL_CAPSULE | Freq: Every day | ORAL | Status: DC
  Administered 2016-01-27 – 2016-02-29 (×32): 40 mg via ORAL
  Filled 2016-01-26 (×32): qty 2

## 2016-01-26 MED ORDER — FLUOXETINE HCL 20 MG PO CAPS
20.0000 mg | ORAL_CAPSULE | Freq: Once | ORAL | Status: AC
  Administered 2016-01-26: 20 mg via ORAL
  Filled 2016-01-26: qty 1

## 2016-01-26 NOTE — Evaluation (Signed)
Occupational Therapy Evaluation Patient Details Name: Sara Barnes MRN: [id number] DOB: 01-23-1941 Today's Date: 01/26/2016    History of Present Illness 75 y.o. female admitted with abdominal pain, bloody BMs. Dx of ulcerative colitis. Pt had recent hospitalization with similar symptoms, DCed home 12/29/15. PMH of breast cancer.    Clinical Impression   Pt was independent prior to admission. Present with generalized weakness, decreased activity tolerance, poor standing balance and impaired memory interfering with ability to perform ADL at her baseline. Will follow acutely. Recommending short term rehab in SNF upon d/c, pt and sister in agreement with plan.    Follow Up Recommendations  SNF;Supervision/Assistance - 24 hour    Equipment Recommendations   (defer to next venue)    Recommendations for Other Services       Precautions / Restrictions Precautions Precautions: Fall Precaution Comments: has fallen x 2 in hospital, pt denies prior falls at home; rectal drain Restrictions Weight Bearing Restrictions: No      Mobility Bed Mobility Overal bed mobility: Needs Assistance Bed Mobility: Sit to Supine       Sit to supine: Min assist   General bed mobility comments: assisted LEs  Transfers Overall transfer level: Needs assistance Equipment used: Rolling walker (2 wheeled) Transfers: Sit to/from Stand Sit to Stand: Min assist         General transfer comment: VCs hand placement, Min A to rise and steady    Balance     Sitting balance-Leahy Scale: Good       Standing balance-Leahy Scale: Fair                              ADL Overall ADL's : Needs assistance/impaired Eating/Feeding: Independent;Sitting   Grooming: Wash/dry hands;Wash/dry face;Sitting;Supervision/safety   Upper Body Bathing: Supervision/ safety;Sitting   Lower Body Bathing: Maximal assistance;Sit to/from stand   Upper Body Dressing : Minimal assistance;Sitting   Lower  Body Dressing: Maximal assistance;Sit to/from stand   Toilet Transfer: Minimal assistance;Ambulation;BSC;RW   Toileting- Clothing Manipulation and Hygiene: Minimal assistance;Sit to/from stand       Functional mobility during ADLs: Minimal assistance;Rolling walker;Cueing for safety General ADL Comments: Pt has been showering with assistance, sitting on 3 in 1.     Vision     Perception     Praxis      Pertinent Vitals/Pain Pain Assessment: Faces Faces Pain Scale: Hurts little more Pain Location: rectum Pain Descriptors / Indicators: Sore Pain Intervention(s): Monitored during session;Premedicated before session;Repositioned     Hand Dominance Right   Extremity/Trunk Assessment Upper Extremity Assessment Upper Extremity Assessment: Generalized weakness;Overall [facility] for tasks assessed   Lower Extremity Assessment Lower Extremity Assessment: Defer to PT evaluation   Cervical / Trunk Assessment Cervical / Trunk Assessment: Normal   Communication Communication Communication: HOH   Cognition Arousal/Alertness: Awake/alert Behavior During Therapy: WFL for tasks assessed/performed Overall Cognitive Status: Impaired/Different from baseline Area of Impairment: Memory     Memory: Decreased short-term memory         General Comments: sister reports pt is mildly confused from the pain medication; pt is able to follow directions and is oriented to self, situation, location; mild memory deficit   General Comments       Exercises       Shoulder Instructions      Home Living Family/patient expects to be discharged to:: Skilled nursing facility Living Arrangements: Alone Available Help at Discharge: Family;Available PRN/intermittently  Home Equipment: None          Prior Functioning/Environment Level of Independence: Independent                 OT Problem List: Decreased strength;Decreased activity tolerance;Impaired  balance (sitting and/or standing);Decreased cognition;Decreased safety awareness;Decreased knowledge of use of DME or AE;Pain   OT Treatment/Interventions: Self-care/ADL training;DME and/or AE instruction;Cognitive remediation/compensation;Balance training;Patient/family education;Therapeutic activities    OT Goals(Current goals can be found in the care plan section) Acute Rehab OT Goals Patient Stated Goal: to be able to cook and garden OT Goal Formulation: With patient Time For Goal Achievement: 02/09/16 Potential to Achieve Goals: Good ADL Goals Pt Will Perform Grooming: with supervision;standing Pt Will Perform Lower Body Bathing: with supervision;sit to/from stand (AE as needed) Pt Will Perform Lower Body Dressing: with supervision;sit to/from stand (AE as needed) Pt Will Transfer to Toilet: with supervision;ambulating;bedside commode (over toilet) Pt Will Perform Toileting - Clothing Manipulation and hygiene: with supervision;sit to/from stand Pt Will Perform Tub/Shower Transfer: Shower transfer;ambulating;rolling walker;3 in 1;with supervision Additional ADL Goal #1: Pt will recall and generalize education from one session to the next with min verbal cues.  OT Frequency: Min 2X/week   Barriers to D/C: Decreased caregiver support          Co-evaluation              End of Session Equipment Utilized During Treatment: Rolling walker;Gait belt Nurse Communication:  (pt wants sign on door to limit visitors)  Activity Tolerance: Patient limited by fatigue;Patient limited by pain Patient left: in bed;with call bell/phone within reach;with bed alarm set;with family/visitor present   Time: 1400-1420 OT Time Calculation (min): 20 min Charges:  OT General Charges $OT Visit: 1 Procedure OT Evaluation $OT Eval Moderate Complexity: 1 Procedure G-Codes:    Malka So 01/26/2016, 2:39 PM [phone]

## 2016-01-26 NOTE — Progress Notes (Signed)
PROGRESS NOTE    Sara Barnes  ZPH:[id number] DOB: 1940-11-09 DOA: 01/07/2016 PCP: Ann Held, DO   Chief Complaint  Patient presents with  . Rectal Bleeding    SINCE FRIDAY     Brief Narrative:  HPI on 01/07/2016 by Dr. Gilman Schmidt [name] a 75 y.o.femalewith medical history significant of ulcerative colitis versus Crohn's disease, believed to be UC eventually,otherwise healthy, presents to the emergency room with chief complaint of severe abdominal pain for the past several days. She was recently hospitalized and discharged home about 9 days prior to admission. She was here in the hospital with similar complaints, placed on steroids, and tells me today that she improved some and really wanted to go home. At home, she has had ongoing symptoms, never really well achieved full remission, and over the last few days she has felt extremely unwell. She has crampy abdominal pain, nausea, poor appetite, barely able to keep down some Gatorade, and has been having significant amounts of bloody bowel movements as well. She has no fever or chills. She has no chest pain or shortness of breath. She denies any palpitations. She has no lightheadedness or dizziness. She feels generalized weakness. In the emergency room her vital signs are stable, her blood work is essentially unremarkable. Gastroenterology was consulted by EDP and have evaluated patient, and TRH was asked for admission  Interim history Gastroenterology consultation appreciated. Patient underwent flexible sigmoidoscopy. CMV was negative and she was started on Remicade. She was initially doing better and tolerating some diet, GI following and since still having a lot of pain and frequent BMs will be dosed with another Remicaide infusion on  9/17. 9/21 pain and bloody clots worsened overnight. CT abd/pelvis showed perirectal abscess- General surgery Was consulted. Patient underwent I&D on 9/26. Steroids were decreased per  GI recommendations  Assessment & Plan   Abdominal pain secondary to Ulcerative colitis flare with rectal bleeding/ Perianal abscess -Gastroenterology consulted, following Surgery following -s/p flexible sigmoidoscopy on 01/08/16, biopsy with findings of active erosive ulcer. No CMV found. Patient was started on Remicade on 01/09/2016 and 01/13/16. Following dose of Remicade is anticipated on 01/28/2016. -Continue on zosyn that was started as of 9/26.  -Gen. surgery consulted and appreciated, now status post I&D of abscess. -Steroids decreased to 77m AM, 242mpm -Tolerating diet. Stable  Acute blood loss anemia/Symptomatic anemia -Secondary to the above -hemoglobin stable today at 11.5 -Will recheck CBC in AM  Diarrhea secondary to UC flare -Cdiff negative x2 -Improving  Depression/Anxiety -Prozac dose increased to 40 mg per gastroenterology  Debility -PT/OT consulted -Recs for SNF  Thrombocytopenia -Platelets have trended below 100,000 today -We will discontinue Lovenox -Start SCDs for DVT prophylaxis -Repeat CBC in the morning   DVT Prophylaxis   SCDs  Code Status: Full  Family Communication: Patient in room, family at bedside  Disposition Plan:  timing uncertain, plan snf  Consultants Gastroenterology  General surgery  Procedures  Flexible sigmoidoscopy Incision and drainage. No abscess on 01/22/2016  Antibiotics   Anti-infectives    Start     Dose/Rate Route Frequency Ordered Stop   01/22/16 0900  piperacillin-tazobactam (ZOSYN) IVPB 3.375 g     3.375 g 12.5 mL/hr over 240 Minutes Intravenous Every 8 hours 01/22/16 0840     01/21/16 1830  ciprofloxacin (CIPRO) IVPB 400 mg  Status:  Discontinued     400 mg 200 mL/hr over 60 Minutes Intravenous Every 12 hours 01/21/16 1744 01/22/16 0840  01/21/16 1830  metroNIDAZOLE (FLAGYL) IVPB 500 mg  Status:  Discontinued     500 mg 100 mL/hr over 60 Minutes Intravenous Every 6 hours 01/21/16 1744 01/22/16 0840        Subjective:   Patient is without complaints today  Objective:   Vitals:   01/24/16 2058 01/25/16 0638 01/25/16 1408 01/26/16 0520  BP: 131/79 (!) 155/79 122/71 (!) 164/87  Pulse: 67 (!) 58 66 67  Resp: 20 19 20 16   Temp: 98.3 F (36.8 C) 98.1 F (36.7 C) 98.6 F (37 C) 98 F (36.7 C)  TempSrc: Oral Oral Oral Oral  SpO2: 100% 98% 95% 99%  Weight:      Height:        Intake/Output Summary (Last 24 hours) at 01/26/16 1827 Last data filed at 01/26/16 1300  Gross per 24 hour  Intake             1910 ml  Output                0 ml  Net             1910 ml   Filed Weights   01/07/16 1136  Weight: 72.6 kg (160 lb)    Exam  General: Lying in bed, appears comfortable  HEENT: Neck supple, trachea midline  Cardiovascular: Regular rate, S1-S2  Respiratory: Normal respiratory effort, no wheezing  Abdomen: Mildly tender, positive bowel sounds  Extremities: No clubbing or cyanosis  Neuro: cn no tremors were seizures  Psych: Mood normal, no visual hallucinations  Skin: No pallor, skin turgor normal   Data Reviewed: I have personally reviewed following labs and imaging studies  CBC:  Recent Labs Lab 01/22/16 0539 01/23/16 0605 01/24/16 0601 01/25/16 0607 01/26/16 0542  WBC 11.8* 9.2 7.8 5.7 5.2  NEUTROABS 10.8* 8.3* 7.2 5.2 4.5  HGB 8.6* 10.1* 10.1* 10.8* 11.5*  HCT 25.6* 30.3* 31.1* 33.2* 33.9*  MCV 91.1 89.1 87.9 90.7 87.6  PLT 165 118* 105* 109* 96*   Basic Metabolic Panel:  Recent Labs Lab 01/22/16 0539 01/23/16 0605 01/24/16 0601 01/25/16 0607 01/26/16 0542  NA 138 135 135 134* 135  K 4.1 4.4 4.1 4.6 4.2  CL 107 102 102 100* 98*  CO2 26 26 27 27 30   GLUCOSE 105* 101* 100* 93 91  BUN 16 11 12 10 8   CREATININE 0.57 0.60 0.56 0.46 0.44  CALCIUM 8.2* 7.9* 8.0* 8.1* 8.0*   GFR: Estimated Creatinine Clearance: 62.9 mL/min (by C-G formula based on SCr of 0.44 mg/dL). Liver Function Tests:  Recent Labs Lab 01/22/16 0539  01/23/16 0605 01/24/16 0601 01/25/16 0607 01/26/16 0542  AST 12* 11* 10* 15 22  ALT 14 13* 12* 15 21  ALKPHOS 69 61 54 57 62  BILITOT 0.4 0.5 0.3 0.4 0.4  PROT 4.6* 4.3* 4.6* 4.8* 5.0*  ALBUMIN 2.0* 1.8* 1.8* 1.8* 2.0*   No results for input(s): LIPASE, AMYLASE in the last 168 hours. No results for input(s): AMMONIA in the last 168 hours. Coagulation Profile: No results for input(s): INR, PROTIME in the last 168 hours. Cardiac Enzymes: No results for input(s): CKTOTAL, CKMB, CKMBINDEX, TROPONINI in the last 168 hours. BNP (last 3 results) No results for input(s): PROBNP in the last 8760 hours. HbA1C: No results for input(s): HGBA1C in the last 72 hours. CBG: No results for input(s): GLUCAP in the last 168 hours. Lipid Profile: No results for input(s): CHOL, HDL, LDLCALC, TRIG, CHOLHDL, LDLDIRECT in the last 72  hours. Thyroid Function Tests: No results for input(s): TSH, T4TOTAL, FREET4, T3FREE, THYROIDAB in the last 72 hours. Anemia Panel: No results for input(s): VITAMINB12, FOLATE, FERRITIN, TIBC, IRON, RETICCTPCT in the last 72 hours. Urine analysis:    Component Value Date/Time   COLORURINE YELLOW 04/19/2015 0838   APPEARANCEUR CLOUDY (A) 04/19/2015 0838   LABSPEC 1.013 04/19/2015 0838   PHURINE 6.0 04/19/2015 0838   GLUCOSEU NEGATIVE 04/19/2015 0838   HGBUR MODERATE (A) 04/19/2015 0838   HGBUR small 12/13/2009 0904   BILIRUBINUR NEGATIVE 04/19/2015 0838   BILIRUBINUR ne 01/30/2015 1543   KETONESUR 40 (A) 04/19/2015 0838   PROTEINUR NEGATIVE 04/19/2015 0838   UROBILINOGEN 0.2 01/30/2015 1543   UROBILINOGEN 0.2 09/25/2013 1350   NITRITE NEGATIVE 04/19/2015 0838   LEUKOCYTESUR TRACE (A) 04/19/2015 0838   Sepsis Labs: @LABRCNTIP (procalcitonin:4,lacticidven:4)  ) Recent Results (from the past 240 hour(s))  C difficile quick scan w PCR reflex     Status: None   Collection Time: 01/21/16  4:43 PM  Result Value Ref Range Status   C Diff antigen NEGATIVE NEGATIVE  Final   C Diff toxin NEGATIVE NEGATIVE Final   C Diff interpretation No C. difficile detected.  Final  MRSA PCR Screening     Status: None   Collection Time: 01/22/16 10:55 AM  Result Value Ref Range Status   MRSA by PCR NEGATIVE NEGATIVE Final    Comment:        The GeneXpert MRSA Assay (FDA approved for NASAL specimens only), is one component of a comprehensive MRSA colonization surveillance program. It is not intended to diagnose MRSA infection nor to guide or monitor treatment for MRSA infections.   Aerobic/Anaerobic Culture (surgical/deep wound)     Status: None (Preliminary result)   Collection Time: 01/22/16  1:17 PM  Result Value Ref Range Status   Specimen Description ABSCESS PERIRECTAL  Final   Special Requests NONE  Final   Gram Stain   Final    FEW WBC PRESENT,BOTH PMN AND MONONUCLEAR MANY GRAM POSITIVE COCCI IN PAIRS AND CHAINS MANY GRAM NEGATIVE RODS MANY GRAM POSITIVE RODS MODERATE GRAM NEGATIVE COCCOBACILLI RARE YEAST Performed at [facility]    Culture   Final    MULTIPLE ORGANISMS PRESENT, NONE PREDOMINANT NO ANAEROBES ISOLATED; CULTURE IN PROGRESS FOR 5 DAYS    Report Status PENDING  Incomplete      Radiology Studies: No results found.   Scheduled Meds: . chlorhexidine  15 mL Mouth/Throat BID  . dicyclomine  10 mg Oral TID AC & HS  . [START ON 01/27/2016] FLUoxetine  40 mg Oral Daily  . hydrocortisone  25 mg Rectal Q0600  . methylPREDNISolone (SOLU-MEDROL) injection  20 mg Intravenous Q12H  . midazolam  2 mg Intravenous Once  . oxymorphone  10 mg Oral QAC breakfast  . oxymorphone  20 mg Oral QHS  . piperacillin-tazobactam (ZOSYN)  IV  3.375 g Intravenous Q8H   Continuous Infusions: . sodium chloride 50 mL/hr at 01/25/16 1900     LOS: 18 days    CHIU, STEPHEN K  MD. on 01/26/2016 at 6:27 PM  Between 7am to 7pm - Pager - [phone]  After 7pm go to [email or web address] - password [email or web address]  And look for the night coverage person covering  for me after hours  [facility] Office  [phone]

## 2016-01-26 NOTE — Progress Notes (Signed)
Patient ID: Sara Barnes, female   DOB: [birth year], 75 y.o.   MRN: [id number] [facility] Surgery Progress Note:   4 Days Post-Op  Subjective: Mental status is clear.  Looking forward to PT and sitz bath today.   Objective: Vital signs in last 24 hours: Temp:  [98 F (36.7 C)-98.6 F (37 C)] 98 F (36.7 C) (09/30 0520) Pulse Rate:  [66-67] 67 (09/30 0520) Resp:  [16-20] 16 (09/30 0520) BP: (122-164)/(71-87) 164/87 (09/30 0520) SpO2:  [95 %-99 %] 99 % (09/30 0520)  Intake/Output from previous day: 09/29 0701 - 09/30 0700 In: 6333 [P.O.:720; I.V.:650; IV Piggyback:50] Out: -  Intake/Output this shift: Total I/O In: 580 [P.O.:480; I.V.:100] Out: -   Physical Exam: Work of breathing is normal  Lab Results:  Results for orders placed or performed during the hospital encounter of 01/07/16 (from the past 48 hour(s))  C-reactive protein     Status: Abnormal   Collection Time: 01/25/16  6:07 AM  Result Value Ref Range   CRP 8.5 (H) <1.0 mg/dL    Comment: Performed at [facility]  CBC with Differential     Status: Abnormal   Collection Time: 01/25/16  6:07 AM  Result Value Ref Range   WBC 5.7 4.0 - 10.5 K/uL    Comment: WHITE COUNT CONFIRMED ON SMEAR   RBC 3.66 (L) 3.87 - 5.11 MIL/uL   Hemoglobin 10.8 (L) 12.0 - 15.0 g/dL   HCT 33.2 (L) 36.0 - 46.0 %   MCV 90.7 78.0 - 100.0 fL   MCH 29.5 26.0 - 34.0 pg   MCHC 32.5 30.0 - 36.0 g/dL   RDW 14.9 11.5 - 15.5 %   Platelets 109 (L) 150 - 400 K/uL    Comment: SPECIMEN CHECKED FOR CLOTS REPEATED TO VERIFY PLATELET COUNT CONFIRMED BY SMEAR    Neutrophils Relative % 90 %   Lymphocytes Relative 6 %   Monocytes Relative 4 %   Eosinophils Relative 0 %   Basophils Relative 0 %   Neutro Abs 5.2 1.7 - 7.7 K/uL   Lymphs Abs 0.3 (L) 0.7 - 4.0 K/uL   Monocytes Absolute 0.2 0.1 - 1.0 K/uL   Eosinophils Absolute 0.0 0.0 - 0.7 K/uL   Basophils Absolute 0.0 0.0 - 0.1 K/uL   WBC Morphology DOHLE BODIES   Comprehensive metabolic  panel Once     Status: Abnormal   Collection Time: 01/25/16  6:07 AM  Result Value Ref Range   Sodium 134 (L) 135 - 145 mmol/L   Potassium 4.6 3.5 - 5.1 mmol/L   Chloride 100 (L) 101 - 111 mmol/L   CO2 27 22 - 32 mmol/L   Glucose, Bld 93 65 - 99 mg/dL   BUN 10 6 - 20 mg/dL   Creatinine, Ser 0.46 0.44 - 1.00 mg/dL   Calcium 8.1 (L) 8.9 - 10.3 mg/dL   Total Protein 4.8 (L) 6.5 - 8.1 g/dL   Albumin 1.8 (L) 3.5 - 5.0 g/dL   AST 15 15 - 41 U/L   ALT 15 14 - 54 U/L   Alkaline Phosphatase 57 38 - 126 U/L   Total Bilirubin 0.4 0.3 - 1.2 mg/dL   GFR calc non Af Amer >60 >60 mL/min   GFR calc Af Amer >60 >60 mL/min    Comment: (NOTE) The eGFR has been calculated using the CKD EPI equation. This calculation has not been validated in all clinical situations. eGFR's persistently <60 mL/min signify possible Chronic Kidney Disease.  Anion gap 7 5 - 15  CBC with Differential     Status: Abnormal   Collection Time: 01/26/16  5:42 AM  Result Value Ref Range   WBC 5.2 4.0 - 10.5 K/uL   RBC 3.87 3.87 - 5.11 MIL/uL   Hemoglobin 11.5 (L) 12.0 - 15.0 g/dL   HCT 33.9 (L) 36.0 - 46.0 %   MCV 87.6 78.0 - 100.0 fL   MCH 29.7 26.0 - 34.0 pg   MCHC 33.9 30.0 - 36.0 g/dL   RDW 14.4 11.5 - 15.5 %   Platelets 96 (L) 150 - 400 K/uL    Comment: CONSISTENT WITH PREVIOUS RESULT   Neutrophils Relative % 86 %   Neutro Abs 4.5 1.7 - 7.7 K/uL   Lymphocytes Relative 10 %   Lymphs Abs 0.5 (L) 0.7 - 4.0 K/uL   Monocytes Relative 4 %   Monocytes Absolute 0.2 0.1 - 1.0 K/uL   Eosinophils Relative 0 %   Eosinophils Absolute 0.0 0.0 - 0.7 K/uL   Basophils Relative 0 %   Basophils Absolute 0.0 0.0 - 0.1 K/uL  Comprehensive metabolic panel Once     Status: Abnormal   Collection Time: 01/26/16  5:42 AM  Result Value Ref Range   Sodium 135 135 - 145 mmol/L   Potassium 4.2 3.5 - 5.1 mmol/L   Chloride 98 (L) 101 - 111 mmol/L   CO2 30 22 - 32 mmol/L   Glucose, Bld 91 65 - 99 mg/dL   BUN 8 6 - 20 mg/dL    Creatinine, Ser 0.44 0.44 - 1.00 mg/dL   Calcium 8.0 (L) 8.9 - 10.3 mg/dL   Total Protein 5.0 (L) 6.5 - 8.1 g/dL   Albumin 2.0 (L) 3.5 - 5.0 g/dL   AST 22 15 - 41 U/L   ALT 21 14 - 54 U/L   Alkaline Phosphatase 62 38 - 126 U/L   Total Bilirubin 0.4 0.3 - 1.2 mg/dL   GFR calc non Af Amer >60 >60 mL/min   GFR calc Af Amer >60 >60 mL/min    Comment: (NOTE) The eGFR has been calculated using the CKD EPI equation. This calculation has not been validated in all clinical situations. eGFR's persistently <60 mL/min signify possible Chronic Kidney Disease.    Anion gap 7 5 - 15    Radiology/Results: No results found.  Anti-infectives: Anti-infectives    Start     Dose/Rate Route Frequency Ordered Stop   01/22/16 0900  piperacillin-tazobactam (ZOSYN) IVPB 3.375 g     3.375 g 12.5 mL/hr over 240 Minutes Intravenous Every 8 hours 01/22/16 0840     01/21/16 1830  ciprofloxacin (CIPRO) IVPB 400 mg  Status:  Discontinued     400 mg 200 mL/hr over 60 Minutes Intravenous Every 12 hours 01/21/16 1744 01/22/16 0840   01/21/16 1830  metroNIDAZOLE (FLAGYL) IVPB 500 mg  Status:  Discontinued     500 mg 100 mL/hr over 60 Minutes Intravenous Every 6 hours 01/21/16 1744 01/22/16 0840      Assessment/Plan: Problem List: Patient Active Problem List   Diagnosis Date Noted  . Difficulty in walking, not elsewhere classified   . Acute blood loss anemia   . Indeterminate colitis   . Absolute anemia   . Left upper quadrant pain   . Abdominal pain 01/07/2016  . GI bleed 01/07/2016  . IBS (irritable bowel syndrome)   . Ten[place]mus   . Ulcerative pancolitis with rectal bleeding ([place])   . Diarrhea 12/24/2015  .  Crohn disease ([place]) 12/24/2015  . Abscess of anal or rectal region   . Peri-rectal abscess   . Dehydration   . Ulcerative pancolitis with abscess ([place])   . Sepsis ([place]) 04/19/2015  . Rectal pain   . Leucocytosis   . Rectal bleeding 04/18/2015  . Ulcerative colitis with rectal bleeding  ([place]) 04/18/2015  . Cancer of lower-inner quadrant of left female breast ([place]) 11/30/2014  . Depression 08/12/2006    Post drainage of perirectal with penrose drains for continued drainage 4 Days Post-Op    LOS: 18 days   Matt B. Hassell Done, MD, [facility] [phone] beeper [phone]  01/26/2016 10:08 AM

## 2016-01-26 NOTE — Progress Notes (Signed)
     [facility] Progress Note  Subjective:  Worked with PT yesterday and they will see her again today.  Still using Sitz baths.  Reports much less blood with BM's.  Long-acting pain meds adjusted yesterday by Dr. Hilarie Fredrickson; so far she seems unaffected by this change.  Objective:  Vital signs in last 24 hours: Temp:  [98 F (36.7 C)-98.6 F (37 C)] 98 F (36.7 C) (09/30 0520) Pulse Rate:  [66-67] 67 (09/30 0520) Resp:  [16-20] 16 (09/30 0520) BP: (122-164)/(71-87) 164/87 (09/30 0520) SpO2:  [95 %-99 %] 99 % (09/30 0520) Last BM Date: 01/25/16 General:  Alert, Well-developed, in NAD Heart:  Regular rate and rhythm; no murmurs Pulm:  CTAB.  No W/R/R. Abdomen:  Soft, non-distended.  BS present.  Lower abdominal TTP.  Extremities:  Without edema. Neurologic:  Alert and oriented x 4;  grossly normal neurologically. Psych:  Alert and cooperative. Normal mood and affect.  Intake/Output from previous day: 09/29 0701 - 09/30 0700 In: 8413 [P.O.:720; I.V.:650; IV Piggyback:50] Out: -  Intake/Output this shift: Total I/O In: 50 [I.V.:50] Out: -   Lab Results:  Recent Labs  01/24/16 0601 01/25/16 0607 01/26/16 0542  WBC 7.8 5.7 5.2  HGB 10.1* 10.8* 11.5*  HCT 31.1* 33.2* 33.9*  PLT 105* 109* 96*   BMET  Recent Labs  01/24/16 0601 01/25/16 0607 01/26/16 0542  NA 135 134* 135  K 4.1 4.6 4.2  CL 102 100* 98*  CO2 27 27 30   GLUCOSE 100* 93 91  BUN 12 10 8   CREATININE 0.56 0.46 0.44  CALCIUM 8.0* 8.1* 8.0*   LFT  Recent Labs  01/26/16 0542  PROT 5.0*  ALBUMIN 2.0*  AST 22  ALT 21  ALKPHOS 62  BILITOT 0.4   Assessment / Plan: 1. 75 y.o.femalewith indeterminate colitis, severe rectal ulcers and now large perirectal abscess s/p I&D on 9/26-receiving Remicade for which she is due on 10/2 as long as ok from surgical standpoint. IV steroids decreased to 20 mg every 12 hours for wound healing; continue as is for now. On Zosyn.  CRP trending down  again as of yesterday; level is pending today. 2. Anemia:pt received 2 u prbcs 9/25 and 1 unit 9/26. Hgb stable/improvedthis AM. Monitor Hgb and transfuse prn.   LOS: 18 days   Tricia [place] D.  01/26/2016, 8:47 AM  Pager number [phone]

## 2016-01-27 DIAGNOSIS — K50111 Crohn's disease of large intestine with rectal bleeding: Secondary | ICD-10-CM

## 2016-01-27 DIAGNOSIS — K50114 Crohn's disease of large intestine with abscess: Secondary | ICD-10-CM

## 2016-01-27 LAB — CBC WITH DIFFERENTIAL/PLATELET
BASOS ABS: 0 10*3/uL (ref 0.0–0.1)
Basophils Relative: 0 %
Eosinophils Absolute: 0 10*3/uL (ref 0.0–0.7)
Eosinophils Relative: 0 %
HCT: 34.4 % — ABNORMAL LOW (ref 36.0–46.0)
HEMOGLOBIN: 11.2 g/dL — AB (ref 12.0–15.0)
LYMPHS ABS: 0.5 10*3/uL — AB (ref 0.7–4.0)
LYMPHS PCT: 10 %
MCH: 29.6 pg (ref 26.0–34.0)
MCHC: 32.6 g/dL (ref 30.0–36.0)
MCV: 90.8 fL (ref 78.0–100.0)
Monocytes Absolute: 0.2 10*3/uL (ref 0.1–1.0)
Monocytes Relative: 5 %
NEUTROS ABS: 4.1 10*3/uL (ref 1.7–7.7)
NEUTROS PCT: 86 %
PLATELETS: 100 10*3/uL — AB (ref 150–400)
RBC: 3.79 MIL/uL — AB (ref 3.87–5.11)
RDW: 14.4 % (ref 11.5–15.5)
WBC: 4.8 10*3/uL (ref 4.0–10.5)

## 2016-01-27 LAB — C-REACTIVE PROTEIN: CRP: 2.4 mg/dL — AB (ref <1.0)

## 2016-01-27 MED ORDER — INFLIXIMAB 100 MG IV SOLR
10.0000 mg/kg | Freq: Once | INTRAVENOUS | Status: DC
  Filled 2016-01-27: qty 70

## 2016-01-27 MED ORDER — METHYLPREDNISOLONE SODIUM SUCC 40 MG IJ SOLR
15.0000 mg | Freq: Two times a day (BID) | INTRAMUSCULAR | Status: DC
  Administered 2016-01-27 – 2016-01-29 (×5): 15.2 mg via INTRAVENOUS
  Filled 2016-01-27 (×5): qty 1

## 2016-01-27 MED ORDER — OXYMORPHONE HCL ER 10 MG PO T12A
10.0000 mg | EXTENDED_RELEASE_TABLET | Freq: Every day | ORAL | Status: DC
  Administered 2016-01-27 – 2016-02-13 (×18): 10 mg via ORAL
  Filled 2016-01-27 (×18): qty 1

## 2016-01-27 MED ORDER — ENOXAPARIN SODIUM 40 MG/0.4ML ~~LOC~~ SOLN
40.0000 mg | SUBCUTANEOUS | Status: DC
  Administered 2016-01-27 – 2016-01-29 (×3): 40 mg via SUBCUTANEOUS
  Filled 2016-01-27 (×3): qty 0.4

## 2016-01-27 NOTE — Progress Notes (Signed)
PROGRESS NOTE    Sara Barnes  JWJ:[id number] DOB: Dec 21, 1940 DOA: 01/07/2016 PCP: Ann Held, DO   Chief Complaint  Patient presents with  . Rectal Bleeding    SINCE FRIDAY     Brief Narrative:  HPI on 01/07/2016 by Dr. Gilman Schmidt [name] a 75 y.o.femalewith medical history significant of ulcerative colitis versus Crohn's disease, believed to be UC eventually,otherwise healthy, presents to the emergency room with chief complaint of severe abdominal pain for the past several days. She was recently hospitalized and discharged home about 9 days prior to admission. She was here in the hospital with similar complaints, placed on steroids, and tells me today that she improved some and really wanted to go home. At home, she has had ongoing symptoms, never really well achieved full remission, and over the last few days she has felt extremely unwell. She has crampy abdominal pain, nausea, poor appetite, barely able to keep down some Gatorade, and has been having significant amounts of bloody bowel movements as well. She has no fever or chills. She has no chest pain or shortness of breath. She denies any palpitations. She has no lightheadedness or dizziness. She feels generalized weakness. In the emergency room her vital signs are stable, her blood work is essentially unremarkable. Gastroenterology was consulted by EDP and have evaluated patient, and TRH was asked for admission  Interim history Gastroenterology consultation appreciated. Patient underwent flexible sigmoidoscopy. CMV was negative and she was started on Remicade. She was initially doing better and tolerating some diet, GI following and since still having a lot of pain and frequent BMs will be dosed with another Remicaide infusion on  9/17. 9/21 pain and bloody clots worsened overnight. CT abd/pelvis showed perirectal abscess- General surgery Was consulted. Patient underwent I&D on 9/26. Steroids were decreased per  GI recommendations  Assessment & Plan   Abdominal pain secondary to Ulcerative colitis flare with rectal bleeding/ Perianal abscess -Gastroenterology and surgery are following -s/p is flexible sigmoidoscopy on 01/08/16, biopsy with findings of active erosive ulcer. No CMV found. Patient was given Remicade on 01/09/2016 and 01/13/16. Next dose of Remicade is anticipated on 01/28/2016. -For now, patient is continued on zosyn that was started as of 9/26.  -Patient is status post I&D of abscess. -Solu-Medrol currently at 15 mg every 12 hours -Tolerating diet. Stable still  Acute blood loss anemia/Symptomatic anemia -Secondary to the above -hemoglobin stable today at 11.2  -Will recheck CBC in AM  Diarrhea secondary to UC flare -Cdiff negative x2 -Clinically improving  Depression/Anxiety -Prozac is continued  Debility -PT/OT was consulted -Thus far, recommendations for SNF  Thrombocytopenia -Lovenox was held on 01/26/2016 secondary to thrombocytopenia below 1000 -Had started SCDs for DVT prophylaxis -Platelets improved today to 100,000 -Agree that patient is hypercoagulable. Therapeutic anticoagulation has since been resumed   DVT Prophylaxis   SCDs, Lovenox  Code Status: Full  Family Communication: Patient in room, family at bedside  Disposition Plan:  timing uncertain, plan snf  Consultants Gastroenterology  General surgery  Procedures  Flexible sigmoidoscopy Incision and drainage. No abscess on 01/22/2016  Antibiotics   Anti-infectives    Start     Dose/Rate Route Frequency Ordered Stop   01/22/16 0900  piperacillin-tazobactam (ZOSYN) IVPB 3.375 g     3.375 g 12.5 mL/hr over 240 Minutes Intravenous Every 8 hours 01/22/16 0840     01/21/16 1830  ciprofloxacin (CIPRO) IVPB 400 mg  Status:  Discontinued     400 mg  200 mL/hr over 60 Minutes Intravenous Every 12 hours 01/21/16 1744 01/22/16 0840   01/21/16 1830  metroNIDAZOLE (FLAGYL) IVPB 500 mg  Status:   Discontinued     500 mg 100 mL/hr over 60 Minutes Intravenous Every 6 hours 01/21/16 1744 01/22/16 0840      Subjective:   Patient reports feeling somewhat better  Objective:   Vitals:   01/26/16 1842 01/26/16 2056 01/27/16 0458 01/27/16 1351  BP: (!) 141/78 131/67 140/71 125/64  Pulse: 64 61 (!) 53 61  Resp: 18 18 18 18   Temp:  97.9 F (36.6 C) 97.6 F (36.4 C) 98.4 F (36.9 C)  TempSrc:  Oral Oral Oral  SpO2: 100% 98% 94% 100%  Weight:      Height:        Intake/Output Summary (Last 24 hours) at 01/27/16 1621 Last data filed at 01/27/16 0600  Gross per 24 hour  Intake             1280 ml  Output                0 ml  Net             1280 ml   Filed Weights   01/07/16 1136  Weight: 72.6 kg (160 lb)    Exam  General:  Awake, in no acute distress  HEENT: Pupils equal round reactive bilaterally to light, dentition fair  Cardiovascular: Regular rhythm, S1-S2  Respiratory: Auscultation bilaterally,  Abdomen: Nondistended, generally tender  Extremities: No cyanosis, no joint deformities  Neuro: Cranial nerve XII grossly intact,sensation intact  Psych: Normal affect, no auditory hallucinations  Skin: Notable skin lesions seen, no rashes,   Data Reviewed: I have personally reviewed following labs and imaging studies  CBC:  Recent Labs Lab 01/23/16 0605 01/24/16 0601 01/25/16 0607 01/26/16 0542 01/27/16 0519  WBC 9.2 7.8 5.7 5.2 4.8  NEUTROABS 8.3* 7.2 5.2 4.5 4.1  HGB 10.1* 10.1* 10.8* 11.5* 11.2*  HCT 30.3* 31.1* 33.2* 33.9* 34.4*  MCV 89.1 87.9 90.7 87.6 90.8  PLT 118* 105* 109* 96* 659*   Basic Metabolic Panel:  Recent Labs Lab 01/22/16 0539 01/23/16 0605 01/24/16 0601 01/25/16 0607 01/26/16 0542  NA 138 135 135 134* 135  Barnes 4.1 4.4 4.1 4.6 4.2  CL 107 102 102 100* 98*  CO2 26 26 27 27 30   GLUCOSE 105* 101* 100* 93 91  BUN 16 11 12 10 8   CREATININE 0.57 0.60 0.56 0.46 0.44  CALCIUM 8.2* 7.9* 8.0* 8.1* 8.0*   GFR: Estimated  Creatinine Clearance: 62.9 mL/min (by C-G formula based on SCr of 0.44 mg/dL). Liver Function Tests:  Recent Labs Lab 01/22/16 0539 01/23/16 0605 01/24/16 0601 01/25/16 0607 01/26/16 0542  AST 12* 11* 10* 15 22  ALT 14 13* 12* 15 21  ALKPHOS 69 61 54 57 62  BILITOT 0.4 0.5 0.3 0.4 0.4  PROT 4.6* 4.3* 4.6* 4.8* 5.0*  ALBUMIN 2.0* 1.8* 1.8* 1.8* 2.0*   No results for input(s): LIPASE, AMYLASE in the last 168 hours. No results for input(s): AMMONIA in the last 168 hours. Coagulation Profile: No results for input(s): INR, PROTIME in the last 168 hours. Cardiac Enzymes: No results for input(s): CKTOTAL, CKMB, CKMBINDEX, TROPONINI in the last 168 hours. BNP (last 3 results) No results for input(s): PROBNP in the last 8760 hours. HbA1C: No results for input(s): HGBA1C in the last 72 hours. CBG: No results for input(s): GLUCAP in the last 168 hours. Lipid Profile:  No results for input(s): CHOL, HDL, LDLCALC, TRIG, CHOLHDL, LDLDIRECT in the last 72 hours. Thyroid Function Tests: No results for input(s): TSH, T4TOTAL, FREET4, T3FREE, THYROIDAB in the last 72 hours. Anemia Panel: No results for input(s): VITAMINB12, FOLATE, FERRITIN, TIBC, IRON, RETICCTPCT in the last 72 hours. Urine analysis:    Component Value Date/Time   COLORURINE YELLOW 04/19/2015 0838   APPEARANCEUR CLOUDY (A) 04/19/2015 0838   LABSPEC 1.013 04/19/2015 0838   PHURINE 6.0 04/19/2015 0838   GLUCOSEU NEGATIVE 04/19/2015 0838   HGBUR MODERATE (A) 04/19/2015 0838   HGBUR small 12/13/2009 0904   BILIRUBINUR NEGATIVE 04/19/2015 0838   BILIRUBINUR ne 01/30/2015 1543   KETONESUR 40 (A) 04/19/2015 0838   PROTEINUR NEGATIVE 04/19/2015 0838   UROBILINOGEN 0.2 01/30/2015 1543   UROBILINOGEN 0.2 09/25/2013 1350   NITRITE NEGATIVE 04/19/2015 0838   LEUKOCYTESUR TRACE (A) 04/19/2015 0838   Sepsis Labs: @LABRCNTIP (procalcitonin:4,lacticidven:4)  ) Recent Results (from the past 240 hour(s))  C difficile quick scan  w PCR reflex     Status: None   Collection Time: 01/21/16  4:43 PM  Result Value Ref Range Status   C Diff antigen NEGATIVE NEGATIVE Final   C Diff toxin NEGATIVE NEGATIVE Final   C Diff interpretation No C. difficile detected.  Final  MRSA PCR Screening     Status: None   Collection Time: 01/22/16 10:55 AM  Result Value Ref Range Status   MRSA by PCR NEGATIVE NEGATIVE Final    Comment:        The GeneXpert MRSA Assay (FDA approved for NASAL specimens only), is one component of a comprehensive MRSA colonization surveillance program. It is not intended to diagnose MRSA infection nor to guide or monitor treatment for MRSA infections.   Aerobic/Anaerobic Culture (surgical/deep wound)     Status: None (Preliminary result)   Collection Time: 01/22/16  1:17 PM  Result Value Ref Range Status   Specimen Description ABSCESS PERIRECTAL  Final   Special Requests NONE  Final   Gram Stain   Final    FEW WBC PRESENT,BOTH PMN AND MONONUCLEAR MANY GRAM POSITIVE COCCI IN PAIRS AND CHAINS MANY GRAM NEGATIVE RODS MANY GRAM POSITIVE RODS MODERATE GRAM NEGATIVE COCCOBACILLI RARE YEAST Performed at [facility]    Culture   Final    MULTIPLE ORGANISMS PRESENT, NONE PREDOMINANT NO ANAEROBES ISOLATED; CULTURE IN PROGRESS FOR 5 DAYS    Report Status PENDING  Incomplete      Radiology Studies: No results found.   Scheduled Meds: . dicyclomine  10 mg Oral TID AC & HS  . enoxaparin (LOVENOX) injection  40 mg Subcutaneous Q24H  . FLUoxetine  40 mg Oral Daily  . [START ON 01/28/2016] inFLIXimab (REMICADE) infusion  10 mg/kg Intravenous Once  . methylPREDNISolone (SOLU-MEDROL) injection  15.2 mg Intravenous Q12H  . midazolam  2 mg Intravenous Once  . oxymorphone  10 mg Oral QAC breakfast  . oxymorphone  10 mg Oral QHS  . piperacillin-tazobactam (ZOSYN)  IV  3.375 g Intravenous Q8H   Continuous Infusions: . sodium chloride 50 mL/hr at 01/27/16 0218     LOS: 19 days    Sara Barnes,  Sara Bracco K  MD. on 01/27/2016 at 4:21 PM  Between 7am to 7pm - Pager - [phone]  After 7pm go to [email or web address] - password [email or web address]  And look for the night coverage person covering for me after hours  [facility] Office  [phone]

## 2016-01-27 NOTE — Progress Notes (Signed)
Progress Note   Subjective  Feeling overall better today with improved spirits Having stools which remain slightly bloody but with a little more form Overall perirectal/perianal pain stable to slightly improved Has been out of bed with assistance Sister remains at bedside   Objective  Vital signs in last 24 hours: Temp:  [97.6 F (36.4 C)-97.9 F (36.6 C)] 97.6 F (36.4 C) (10/01 0458) Pulse Rate:  [53-64] 53 (10/01 0458) Resp:  [18] 18 (10/01 0458) BP: (131-141)/(67-78) 140/71 (10/01 0458) SpO2:  [94 %-100 %] 94 % (10/01 0458) Last BM Date: 01/26/16  General: Alert, well-developed, in NAD Heart:  Regular rate and rhythm; no murmurs Chest: Clear to ascultation bilaterally Abdomen:  Softer, mildly tender left abd, lower>upper, mildly distended. Normal bowel sounds, without guarding, and without rebound.   Rectal: external exam only, penrose drain in place exits from right and left buttock, scant clotted blood without active bleeding Extremities:  Without edema. Neurologic:  Alert and  oriented x4; grossly normal neurologically. Psych:  Alert and cooperative. improved mood and affect.  Intake/Output from previous day: 09/30 0701 - 10/01 0700 In: 2500 [P.O.:1200; I.V.:1150; IV Piggyback:150] Out: -  Intake/Output this shift: No intake/output data recorded.  Lab Results:  Recent Labs  01/25/16 0607 01/26/16 0542 01/27/16 0519  WBC 5.7 5.2 4.8  HGB 10.8* 11.5* 11.2*  HCT 33.2* 33.9* 34.4*  PLT 109* 96* 100*   BMET  Recent Labs  01/25/16 0607 01/26/16 0542  NA 134* 135  K 4.6 4.2  CL 100* 98*  CO2 27 30  GLUCOSE 93 91  BUN 10 8  CREATININE 0.46 0.44  CALCIUM 8.1* 8.0*   LFT  Recent Labs  01/26/16 0542  PROT 5.0*  ALBUMIN 2.0*  AST 22  ALT 21  ALKPHOS 56  BILITOT 0.4     Assessment & Plan  75 year old with IBD, most likely Crohn's disease complicated by large perirectal abscess  1. Crohn's disease with perirectal abscess -- slowly  improving. Leukocytosis improving on antibiotics and with appropriate drainage of abscess. Hemoglobin has remained stable. --Continue wound care and sitz baths, appreciate surgical involvement. Dr. Marcello Moores will see the patient tomorrow --Continue antibiotics --I have decreased methylprednisolone to 30 mg every 24 hours (steroids will need to be weaned slowly. Expect improved wound healing as they are weaned) --Plan Remicade, dose #2 of induction at 10 make/KG tomorrow --Pain control: I have again decreased long-acting hydromorphone, now to 10 mg twice daily (from 10 mg qam and 20 mg qpm). Eventually this needs to be stopped. She has when necessary IV Dilaudid and oral hydrocodone. I discussed with her I would rather her use on demand oral doses if possible. --Expect she will need rehabilitation, PT and OT involved --I encouraged her to get out of bed and move around as much as possible --Restart subcutaneous Lovenox for DVT prophylaxis, she is high risk for DVT in the setting of active IBD, hospitalization and surgery. Monitor blood counts with resuming prophylactic dose  2. Anemia -- stable. Continue to monitor hemoglobin  3. Thrombocytopenia -- mild and stable. Possibly related to Zosyn. Monitor.     Active Problems:   Ulcerative pancolitis with rectal bleeding (HCC)   Abdominal pain   GI bleed   Left upper quadrant pain   Absolute anemia   Indeterminate colitis   Acute blood loss anemia   Difficulty in walking, not elsewhere classified   Other depression due to general medical condition     LOS: 19  days   Jerene Bears  01/27/2016, 12:05 PM [phone] 8a-5p weekdays [phone] after 5p, weekends, holidays

## 2016-01-27 NOTE — Progress Notes (Signed)
General Surgery [facility]  01/27/2016  Assessment & Plan: POD#4 Status post incision, drainage, and packing of complex perirectal abscess  Packing removed POD#1  Penrose drain to remain in place to promote further drainage, control possible fistulae  IV abx's per medical service  Remicade, steroids per GI service  Sitz baths, showers for wound care  Dr. Leighton Barnes will be on our service this week.  I will ask her to evaluate and give further recommendations.        Sara Regal, MD, [facility]       Office: [phone]    Subjective: Patient comfortable, family at bedside.  No complaints.  Doing Sitz baths and dressing changes.  Objective: Vital signs in last 24 hours: Temp:  [97.6 F (36.4 C)-97.9 F (36.6 C)] 97.6 F (36.4 C) (10/01 0458) Pulse Rate:  [53-64] 53 (10/01 0458) Resp:  [18] 18 (10/01 0458) BP: (131-141)/(67-78) 140/71 (10/01 0458) SpO2:  [94 %-100 %] 94 % (10/01 0458) Last BM Date: 01/26/16  Intake/Output from previous day: 09/30 0701 - 10/01 0700 In: 2500 [P.O.:1200; I.V.:1150; IV Piggyback:150] Out: -  Intake/Output this shift: No intake/output data recorded.  Physical Exam: HEENT - sclerae clear, mucous membranes moist Perineum - dressing dry and intact, no drainage - not removed  Lab Results:   Recent Labs  01/26/16 0542 01/27/16 0519  WBC 5.2 4.8  HGB 11.5* 11.2*  HCT 33.9* 34.4*  PLT 96* 100*   BMET  Recent Labs  01/25/16 0607 01/26/16 0542  NA 134* 135  K 4.6 4.2  CL 100* 98*  CO2 27 30  GLUCOSE 93 91  BUN 10 8  CREATININE 0.46 0.44  CALCIUM 8.1* 8.0*   PT/INR No results for input(s): LABPROT, INR in the last 72 hours. Comprehensive Metabolic Panel:    Component Value Date/Time   NA 135 01/26/2016 0542   NA 134 (L) 01/25/2016 0607   NA 138 12/06/2014 0804   K 4.2 01/26/2016 0542   K 4.6 01/25/2016 0607   K 4.1 12/06/2014 0804   CL 98 (L) 01/26/2016  0542   CL 100 (L) 01/25/2016 0607   CO2 30 01/26/2016 0542   CO2 27 01/25/2016 0607   CO2 25 12/06/2014 0804   BUN 8 01/26/2016 0542   BUN 10 01/25/2016 0607   BUN 12.3 12/06/2014 0804   CREATININE 0.44 01/26/2016 0542   CREATININE 0.46 01/25/2016 0607   CREATININE 0.8 12/06/2014 0804   GLUCOSE 91 01/26/2016 0542   GLUCOSE 93 01/25/2016 0607   GLUCOSE 92 12/06/2014 0804   CALCIUM 8.0 (L) 01/26/2016 0542   CALCIUM 8.1 (L) 01/25/2016 0607   CALCIUM 9.4 12/06/2014 0804   AST 22 01/26/2016 0542   AST 15 01/25/2016 0607   AST 9 12/06/2014 0804   ALT 21 01/26/2016 0542   ALT 15 01/25/2016 0607   ALT 10 12/06/2014 0804   ALKPHOS 62 01/26/2016 0542   ALKPHOS 57 01/25/2016 0607   ALKPHOS 89 12/06/2014 0804   BILITOT 0.4 01/26/2016 0542   BILITOT 0.4 01/25/2016 0607   BILITOT 0.37 12/06/2014 0804   PROT 5.0 (L) 01/26/2016 0542   PROT 4.8 (L) 01/25/2016 0607   PROT 6.5 12/06/2014 0804   ALBUMIN 2.0 (L) 01/26/2016 0542   ALBUMIN 1.8 (L) 01/25/2016 0607   ALBUMIN 3.3 (L) 12/06/2014 0804    Studies/Results: No results found.    Sara Barnes 01/27/2016  Patient ID:  Sara Barnes, female   DOB: 03-19-1941, 75 y.o.   MRN: [id number]

## 2016-01-28 ENCOUNTER — Encounter (HOSPITAL_COMMUNITY): Admit: 2016-01-28 | Payer: Commercial Managed Care - HMO

## 2016-01-28 DIAGNOSIS — K50119 Crohn's disease of large intestine with unspecified complications: Secondary | ICD-10-CM

## 2016-01-28 DIAGNOSIS — K50114 Crohn's disease of large intestine with abscess: Secondary | ICD-10-CM

## 2016-01-28 LAB — CBC WITH DIFFERENTIAL/PLATELET
BASOS ABS: 0 10*3/uL (ref 0.0–0.1)
BASOS PCT: 0 %
EOS ABS: 0 10*3/uL (ref 0.0–0.7)
EOS PCT: 1 %
HCT: 31.2 % — ABNORMAL LOW (ref 36.0–46.0)
Hemoglobin: 10.2 g/dL — ABNORMAL LOW (ref 12.0–15.0)
Lymphocytes Relative: 16 %
Lymphs Abs: 0.8 10*3/uL (ref 0.7–4.0)
MCH: 28.7 pg (ref 26.0–34.0)
MCHC: 32.7 g/dL (ref 30.0–36.0)
MCV: 87.6 fL (ref 78.0–100.0)
MONO ABS: 0.3 10*3/uL (ref 0.1–1.0)
Monocytes Relative: 6 %
Neutro Abs: 3.6 10*3/uL (ref 1.7–7.7)
Neutrophils Relative %: 76 %
PLATELETS: 101 10*3/uL — AB (ref 150–400)
RBC: 3.56 MIL/uL — ABNORMAL LOW (ref 3.87–5.11)
RDW: 14 % (ref 11.5–15.5)
WBC: 4.7 10*3/uL (ref 4.0–10.5)

## 2016-01-28 LAB — C-REACTIVE PROTEIN: CRP: 1.1 mg/dL — AB (ref <1.0)

## 2016-01-28 MED ORDER — INFLIXIMAB 100 MG IV SOLR
10.0000 mg/kg | Freq: Once | INTRAVENOUS | Status: AC
  Administered 2016-01-28: 700 mg via INTRAVENOUS
  Filled 2016-01-28: qty 70

## 2016-01-28 NOTE — Progress Notes (Signed)
  General Surgery [facility]  01/28/2016  Assessment & Plan: POD#5 Status post incision, drainage, and packing of complex perirectal abscess  Packing removed POD#1  Penrose drain to remain in place to promote further drainage, control possible fistulae  IV abx's per medical service  Remicade, steroids per GI service  Sitz baths TID, showers for wound care.  Needs better control of perineal wound prior to starting a biologic  Rosario Adie, MD  Colorectal and General Surgery [facility]     Subjective: Patient comfortable, family at bedside.  No complaints.  Doing Sitz baths once a day and dressing changes PRN.  Objective: Vital signs in last 24 hours: Temp:  [98 F (36.7 C)-98.8 F (37.1 C)] 98 F (36.7 C) (10/02 0556) Pulse Rate:  [61-62] 62 (10/02 0556) Resp:  [18-19] 19 (10/02 0556) BP: (124-140)/(64-76) 140/76 (10/02 0556) SpO2:  [96 %-100 %] 99 % (10/02 0556) Last BM Date: 01/28/16  Intake/Output from previous day: 10/01 0701 - 10/02 0700 In: 1730 [P.O.:480; I.V.:1200; IV Piggyback:50] Out: -  Intake/Output this shift: No intake/output data recorded.  Physical Exam: HEENT - sclerae clear, mucous membranes moist Perineum - significant bloody drainage, penrose intact  Lab Results:   Recent Labs  01/27/16 0519 01/28/16 0454  WBC 4.8 4.7  HGB 11.2* 10.2*  HCT 34.4* 31.2*  PLT 100* 101*   BMET  Recent Labs  01/26/16 0542  NA 135  K 4.2  CL 98*  CO2 30  GLUCOSE 91  BUN 8  CREATININE 0.44  CALCIUM 8.0*   PT/INR No results for input(s): LABPROT, INR in the last 72 hours. Comprehensive Metabolic Panel:    Component Value Date/Time   NA 135 01/26/2016 0542   NA 134 (L) 01/25/2016 0607   NA 138 12/06/2014 0804   K 4.2 01/26/2016 0542   K 4.6 01/25/2016 0607   K 4.1 12/06/2014 0804   CL 98 (L) 01/26/2016 0542   CL 100 (L) 01/25/2016 0607   CO2 30 01/26/2016 0542   CO2 27 01/25/2016 0607   CO2 25  12/06/2014 0804   BUN 8 01/26/2016 0542   BUN 10 01/25/2016 0607   BUN 12.3 12/06/2014 0804   CREATININE 0.44 01/26/2016 0542   CREATININE 0.46 01/25/2016 0607   CREATININE 0.8 12/06/2014 0804   GLUCOSE 91 01/26/2016 0542   GLUCOSE 93 01/25/2016 0607   GLUCOSE 92 12/06/2014 0804   CALCIUM 8.0 (L) 01/26/2016 0542   CALCIUM 8.1 (L) 01/25/2016 0607   CALCIUM 9.4 12/06/2014 0804   AST 22 01/26/2016 0542   AST 15 01/25/2016 0607   AST 9 12/06/2014 0804   ALT 21 01/26/2016 0542   ALT 15 01/25/2016 0607   ALT 10 12/06/2014 0804   ALKPHOS 62 01/26/2016 0542   ALKPHOS 57 01/25/2016 0607   ALKPHOS 89 12/06/2014 0804   BILITOT 0.4 01/26/2016 0542   BILITOT 0.4 01/25/2016 0607   BILITOT 0.37 12/06/2014 0804   PROT 5.0 (L) 01/26/2016 0542   PROT 4.8 (L) 01/25/2016 0607   PROT 6.5 12/06/2014 0804   ALBUMIN 2.0 (L) 01/26/2016 0542   ALBUMIN 1.8 (L) 01/25/2016 0607   ALBUMIN 3.3 (L) 12/06/2014 0804    Studies/Results: No results found.    Leighton Ruff C. 38/06/2917  Patient ID: [name], female   DOB: Jan 02, 1941, 75 y.o.   MRN: [id number]

## 2016-01-28 NOTE — Progress Notes (Signed)
Patient ID: Sara Barnes, female   DOB: Mar 17, 1941, 75 y.o.   MRN: [id number]    Progress Note   Subjective   Getting ready to walk... Had a rough night last  - Rectal pain  She is scheduled to get Remicade today   Objective   Vital signs in last 24 hours: Temp:  [98 F (36.7 C)-98.8 F (37.1 C)] 98 F (36.7 C) (10/02 0556) Pulse Rate:  [61-62] 62 (10/02 0556) Resp:  [18-19] 19 (10/02 0556) BP: (124-140)/(64-76) 140/76 (10/02 0556) SpO2:  [96 %-100 %] 99 % (10/02 0556) Last BM Date: 01/28/16 General:   Older  white female in NAD Heart:  Regular rate and rhythm; no murmurs Lungs: Respirations even and unlabored, lungs CTA bilaterally Abdomen:  Soft, nontender and nondistended. Normal bowel sounds. Extremities:  Without edema. Neurologic:  Alert and oriented,  grossly normal neurologically. Psych:  Cooperative. Normal mood and affect.  Intake/Output from previous day: 10/01 0701 - 10/02 0700 In: 9518 [P.O.:480; I.V.:1200; IV Piggyback:50] Out: -  Intake/Output this shift: No intake/output data recorded.  Lab Results:  Recent Labs  01/26/16 0542 01/27/16 0519 01/28/16 0454  WBC 5.2 4.8 4.7  HGB 11.5* 11.2* 10.2*  HCT 33.9* 34.4* 31.2*  PLT 96* 100* 101*   BMET  Recent Labs  01/26/16 0542  NA 135  K 4.2  CL 98*  CO2 30  GLUCOSE 91  BUN 8  CREATININE 0.44  CALCIUM 8.0*   LFT  Recent Labs  01/26/16 0542  PROT 5.0*  ALBUMIN 2.0*  AST 22  ALT 21  ALKPHOS 62  BILITOT 0.4   PT/INR No results for input(s): LABPROT, INR in the last 72 hours.  Studies/Results: No results found.     Assessment / Plan:    #1 75 yo female with IBD -day #20 hospitalization- most likely Crohns colitis with severe rectal involvement and complicated by large perirectal abscess Surgery following, drains remain in s/p I/D 9/26  Plan; Solumedrol 30 QD Due for Remicade #2 of induction series -plan for 10 mg /kg- Sara Barnes will discuss with surgery to make sure they  dont want to delay a few days given abscess/wound Pain control - on 10 hydromorphone BID , continue Prn Dilaudid or hydrocodone On Lovenox for DVT prophylaxis Zosyn  #2 anemia- stable #3 debilitation - PT working with her for ambulation      Active Problems:   Ulcerative pancolitis with rectal bleeding (HCC)   Abdominal pain   GI bleed   Left upper quadrant pain   Absolute anemia   Indeterminate colitis   Acute blood loss anemia   Difficulty in walking, not elsewhere classified   Other depression due to general medical condition     LOS: 20 days   Sara Barnes  01/28/2016, 10:25 AM

## 2016-01-28 NOTE — Progress Notes (Signed)
PT Cancellation Note  Patient Details Name: Sara Barnes MRN: [id number] DOB: October 13, 1940   Cancelled Treatment:    Reason Eval/Treat Not Completed: Patient at procedure or test/unavailable (pt about to do sitz bath with RN. Will follow.)   Blondell Reveal Kistler 01/28/2016, 3:06 PM [phone]

## 2016-01-28 NOTE — Progress Notes (Signed)
[facility] provided patient and family with list of SNF options,  -to follow up with family to assist with disposition once medically stable.

## 2016-01-28 NOTE — Progress Notes (Signed)
PROGRESS NOTE    Sara Barnes  YQM:[id number] DOB: 1941/02/07 DOA: 01/07/2016 PCP: Ann Held, DO   Chief Complaint  Patient presents with  . Rectal Bleeding    SINCE FRIDAY     Brief Narrative:  HPI on 01/07/2016 by Dr. Gilman Schmidt [name] a 75 y.o.femalewith medical history significant of ulcerative colitis versus Crohn's disease, believed to be UC eventually,otherwise healthy, presents to the emergency room with chief complaint of severe abdominal pain for the past several days. She was recently hospitalized and discharged home about 9 days prior to admission. She was here in the hospital with similar complaints, placed on steroids, and tells me today that she improved some and really wanted to go home. At home, she has had ongoing symptoms, never really well achieved full remission, and over the last few days she has felt extremely unwell. She has crampy abdominal pain, nausea, poor appetite, barely able to keep down some Gatorade, and has been having significant amounts of bloody bowel movements as well. She has no fever or chills. She has no chest pain or shortness of breath. She denies any palpitations. She has no lightheadedness or dizziness. She feels generalized weakness. In the emergency room her vital signs are stable, her blood work is essentially unremarkable. Gastroenterology was consulted by EDP and have evaluated patient, and TRH was asked for admission  Interim history Gastroenterology consultation appreciated. Patient underwent flexible sigmoidoscopy. CMV was negative and she was started on Remicade. She was initially doing better and tolerating some diet, GI following and since still having a lot of pain and frequent BMs will be dosed with another Remicaide infusion on  9/17. 9/21 pain and bloody clots worsened overnight. CT abd/pelvis showed perirectal abscess- General surgery Was consulted. Patient underwent I&D on 9/26. Steroids were decreased per  GI recommendations  Assessment & Plan   Abdominal pain secondary to Ulcerative colitis flare with rectal bleeding/ Perianal abscess -Gastroenterology and surgery continues to follow -s/p is flexible sigmoidoscopy on 01/08/16, biopsy with findings of active erosive ulcer. No CMV found. Patient was given Remicade on 01/09/2016 and 01/13/16. Next dose of Remicade is anticipated on 01/28/2016. -For now, patient remains on zosyn that was started as of 9/26. Hopefully can transition to oral antibiotics soon -Patient is status post I&D of abscess. -Solu-Medrol currently at 15 mg every 12 hours -Patient continues to tolerate diet  Acute blood loss anemia/Symptomatic anemia -Likely secondary to the above -hemoglobin stable today at 10.2  -We will reorder CBC in AM  Diarrhea secondary to UC flare -Cdiff found to be negative x2 -Patient is clinically improving  Depression/Anxiety -Prozac is continued as tolerated  Debility -PT/OT has been consulted -Recommendations made for skilled nursing facility  Thrombocytopenia -Lovenox was held on 01/26/2016 secondary to thrombocytopenia below 1000 -Had started SCDs for DVT prophylaxis -Platelets have improved 100,000 -Agree that patient is hypercoagulable. Therapeutic anticoagulation has been continued   DVT Prophylaxis   Lovenox  Code Status: Full  Family Communication: Patient in room, family at bedside  Disposition Plan:  timing uncertain, plan snf  Consultants Gastroenterology  General surgery  Procedures  Flexible sigmoidoscopy Incision and drainage. No abscess on 01/22/2016  Antibiotics   Anti-infectives    Start     Dose/Rate Route Frequency Ordered Stop   01/22/16 0900  piperacillin-tazobactam (ZOSYN) IVPB 3.375 g     3.375 g 12.5 mL/hr over 240 Minutes Intravenous Every 8 hours 01/22/16 0840     01/21/16 1830  ciprofloxacin (CIPRO) IVPB 400 mg  Status:  Discontinued     400 mg 200 mL/hr over 60 Minutes Intravenous  Every 12 hours 01/21/16 1744 01/22/16 0840   01/21/16 1830  metroNIDAZOLE (FLAGYL) IVPB 500 mg  Status:  Discontinued     500 mg 100 mL/hr over 60 Minutes Intravenous Every 6 hours 01/21/16 1744 01/22/16 0840      Subjective:   No complaints today. Patient tolerating diet  Objective:   Vitals:   01/28/16 1300 01/28/16 1330 01/28/16 1357 01/28/16 1440  BP: (!) 105/59 124/68 (!) 104/51 122/72  Pulse: 74 73 72 82  Resp: 16 16 16 16   Temp: 98.2 F (36.8 C) 98.2 F (36.8 C) 98.6 F (37 C) 98.4 F (36.9 C)  TempSrc: Oral Oral Oral Oral  SpO2: 99% 99% 97% 99%  Weight:      Height:        Intake/Output Summary (Last 24 hours) at 01/28/16 1830 Last data filed at 01/28/16 1646  Gross per 24 hour  Intake          1818.33 ml  Output              701 ml  Net          1117.33 ml   Filed Weights   01/07/16 1136  Weight: 72.6 kg (160 lb)    Exam  General:  Conversant, lying in bed  HEENT: Trachea midline, neck supple  Cardiovascular: Regular rate, no murmurs  Respiratory: Normal respiratory effort, no wheezing   Abdomen: Obese, no masses  Extremities: No clubbing, perfused  Neuro: No tremors, no seizures  Psych: Normal mood, no visual hallucinations  Skin: Normal skin turgor, no draining lesions   Data Reviewed: I have personally reviewed following labs and imaging studies  CBC:  Recent Labs Lab 01/24/16 0601 01/25/16 0607 01/26/16 0542 01/27/16 0519 01/28/16 0454  WBC 7.8 5.7 5.2 4.8 4.7  NEUTROABS 7.2 5.2 4.5 4.1 3.6  HGB 10.1* 10.8* 11.5* 11.2* 10.2*  HCT 31.1* 33.2* 33.9* 34.4* 31.2*  MCV 87.9 90.7 87.6 90.8 87.6  PLT 105* 109* 96* 100* 737*   Basic Metabolic Panel:  Recent Labs Lab 01/22/16 0539 01/23/16 0605 01/24/16 0601 01/25/16 0607 01/26/16 0542  NA 138 135 135 134* 135  K 4.1 4.4 4.1 4.6 4.2  CL 107 102 102 100* 98*  CO2 26 26 27 27 30   GLUCOSE 105* 101* 100* 93 91  BUN 16 11 12 10 8   CREATININE 0.57 0.60 0.56 0.46 0.44    CALCIUM 8.2* 7.9* 8.0* 8.1* 8.0*   GFR: Estimated Creatinine Clearance: 62.9 mL/min (by C-G formula based on SCr of 0.44 mg/dL). Liver Function Tests:  Recent Labs Lab 01/22/16 0539 01/23/16 0605 01/24/16 0601 01/25/16 0607 01/26/16 0542  AST 12* 11* 10* 15 22  ALT 14 13* 12* 15 21  ALKPHOS 69 61 54 57 62  BILITOT 0.4 0.5 0.3 0.4 0.4  PROT 4.6* 4.3* 4.6* 4.8* 5.0*  ALBUMIN 2.0* 1.8* 1.8* 1.8* 2.0*   No results for input(s): LIPASE, AMYLASE in the last 168 hours. No results for input(s): AMMONIA in the last 168 hours. Coagulation Profile: No results for input(s): INR, PROTIME in the last 168 hours. Cardiac Enzymes: No results for input(s): CKTOTAL, CKMB, CKMBINDEX, TROPONINI in the last 168 hours. BNP (last 3 results) No results for input(s): PROBNP in the last 8760 hours. HbA1C: No results for input(s): HGBA1C in the last 72 hours. CBG: No results for input(s): GLUCAP  in the last 168 hours. Lipid Profile: No results for input(s): CHOL, HDL, LDLCALC, TRIG, CHOLHDL, LDLDIRECT in the last 72 hours. Thyroid Function Tests: No results for input(s): TSH, T4TOTAL, FREET4, T3FREE, THYROIDAB in the last 72 hours. Anemia Panel: No results for input(s): VITAMINB12, FOLATE, FERRITIN, TIBC, IRON, RETICCTPCT in the last 72 hours. Urine analysis:    Component Value Date/Time   COLORURINE YELLOW 04/19/2015 0838   APPEARANCEUR CLOUDY (A) 04/19/2015 0838   LABSPEC 1.013 04/19/2015 0838   PHURINE 6.0 04/19/2015 0838   GLUCOSEU NEGATIVE 04/19/2015 0838   HGBUR MODERATE (A) 04/19/2015 0838   HGBUR small 12/13/2009 0904   BILIRUBINUR NEGATIVE 04/19/2015 0838   BILIRUBINUR ne 01/30/2015 1543   KETONESUR 40 (A) 04/19/2015 0838   PROTEINUR NEGATIVE 04/19/2015 0838   UROBILINOGEN 0.2 01/30/2015 1543   UROBILINOGEN 0.2 09/25/2013 1350   NITRITE NEGATIVE 04/19/2015 0838   LEUKOCYTESUR TRACE (A) 04/19/2015 0838   Sepsis Labs: @LABRCNTIP (procalcitonin:4,lacticidven:4)  ) Recent  Results (from the past 240 hour(s))  C difficile quick scan w PCR reflex     Status: None   Collection Time: 01/21/16  4:43 PM  Result Value Ref Range Status   C Diff antigen NEGATIVE NEGATIVE Final   C Diff toxin NEGATIVE NEGATIVE Final   C Diff interpretation No C. difficile detected.  Final  MRSA PCR Screening     Status: None   Collection Time: 01/22/16 10:55 AM  Result Value Ref Range Status   MRSA by PCR NEGATIVE NEGATIVE Final    Comment:        The GeneXpert MRSA Assay (FDA approved for NASAL specimens only), is one component of a comprehensive MRSA colonization surveillance program. It is not intended to diagnose MRSA infection nor to guide or monitor treatment for MRSA infections.   Aerobic/Anaerobic Culture (surgical/deep wound)     Status: None (Preliminary result)   Collection Time: 01/22/16  1:17 PM  Result Value Ref Range Status   Specimen Description ABSCESS PERIRECTAL  Final   Special Requests NONE  Final   Gram Stain   Final    FEW WBC PRESENT,BOTH PMN AND MONONUCLEAR MANY GRAM POSITIVE COCCI IN PAIRS AND CHAINS MANY GRAM NEGATIVE RODS MANY GRAM POSITIVE RODS MODERATE GRAM NEGATIVE COCCOBACILLI RARE YEAST    Culture   Final    MULTIPLE ORGANISMS PRESENT, NONE PREDOMINANT HOLDING FOR POSSIBLE ANAEROBE Performed at [facility]    Report Status PENDING  Incomplete      Radiology Studies: No results found.   Scheduled Meds: . dicyclomine  10 mg Oral TID AC & HS  . enoxaparin (LOVENOX) injection  40 mg Subcutaneous Q24H  . FLUoxetine  40 mg Oral Daily  . methylPREDNISolone (SOLU-MEDROL) injection  15.2 mg Intravenous Q12H  . midazolam  2 mg Intravenous Once  . oxymorphone  10 mg Oral QAC breakfast  . oxymorphone  10 mg Oral QHS  . piperacillin-tazobactam (ZOSYN)  IV  3.375 g Intravenous Q8H   Continuous Infusions: . sodium chloride 50 mL/hr at 01/28/16 1646     LOS: 20 days    Elhadj Girton K  MD. on 01/28/2016 at 6:30 PM  Between  7am to 7pm - Pager - [phone]  After 7pm go to [email or web address] - password [email or web address]  And look for the night coverage person covering for me after hours  [facility] Office  [phone]

## 2016-01-28 NOTE — Progress Notes (Signed)
PT Cancellation Note  Patient Details Name: Sara Barnes MRN: [id number] DOB: May 03, 1940   Cancelled Treatment:    Reason Eval/Treat Not Completed: Other (comment) (pt is about to have remicade tx and requested PT return once this is finished. ) Will follow.    Blondell Reveal Kistler 01/28/2016, 10:21 AM [phone]

## 2016-01-28 NOTE — Progress Notes (Signed)
Occupational Therapy Treatment Patient Details Name: Sara Barnes MRN: [id number] DOB: 01-May-1940 Today's Date: 01/28/2016    History of present illness 75 y.o. female admitted with abdominal pain, bloody BMs. Dx of ulcerative colitis. Pt had recent hospitalization with similar symptoms, DCed home 12/29/15. PMH of breast cancer.    OT comments  Pt walked in hall with OT as well as the below. Pts most comfortable in bed or walking. Sitting is VERY uncomfortable  Follow Up Recommendations  SNF;Supervision/Assistance - 24 hour    Equipment Recommendations  None recommended by OT (defer to next venue)       Precautions / Restrictions Precautions Precautions: Fall Restrictions Weight Bearing Restrictions: No       Mobility Bed Mobility Overal bed mobility: Needs Assistance Bed Mobility: Sit to Supine       Sit to supine: Min assist   General bed mobility comments: assisted LEs  Transfers Overall transfer level: Needs assistance Equipment used: Rolling walker (2 wheeled) Transfers: Sit to/from [facility] Sit to Stand: Min assist Stand pivot transfers: Min assist       General transfer comment: VCs hand placement, Min A to rise and steady        ADL                       Lower Body Dressing: Sit to/from stand;Moderate assistance;Cueing for sequencing;Cueing for compensatory techniques       Toileting- Clothing Manipulation and Hygiene: Minimal assistance;Sit to/from stand;Cueing for sequencing;Cueing for compensatory techniques       Functional mobility during ADLs: Min guard;Cueing for safety;Cueing for sequencing                  Cognition   Behavior During Therapy: [facility] for tasks assessed/performed Overall Cognitive Status: Within Functional Limits for tasks assessed                               General Comments      Pertinent Vitals/ Pain       Pain Assessment: Faces Pain Score: 4  Pain Location:  rectum Pain Descriptors / Indicators: Sore Pain Intervention(s): Monitored during session;Limited activity within patient's tolerance         Frequency  Min 2X/week        Progress Toward Goals  OT Goals(current goals can now be found in the care plan section)  Progress towards OT goals: Progressing toward goals     Plan Discharge plan remains appropriate    Co-evaluation                 End of Session Equipment Utilized During Treatment: Rolling walker;Gait belt   Activity Tolerance Patient limited by fatigue;Patient limited by pain   Patient Left in bed;with call bell/phone within reach;with bed alarm set;with family/visitor present   Nurse Communication Mobility status (pt wants sign on door to limit visitors)        Time: 4503-8882 OT Time Calculation (min): 32 min  Charges: OT General Charges $OT Visit: 1 Procedure OT Treatments $Self Care/Home Management : 23-37 mins  Vasiliki Smaldone, Thereasa Parkin 01/28/2016, 10:54 AM

## 2016-01-29 LAB — CBC WITH DIFFERENTIAL/PLATELET
BASOS ABS: 0 10*3/uL (ref 0.0–0.1)
Basophils Relative: 0 %
EOS ABS: 0 10*3/uL (ref 0.0–0.7)
Eosinophils Relative: 0 %
HCT: 33.8 % — ABNORMAL LOW (ref 36.0–46.0)
HEMOGLOBIN: 11 g/dL — AB (ref 12.0–15.0)
LYMPHS PCT: 12 %
Lymphs Abs: 1 10*3/uL (ref 0.7–4.0)
MCH: 28.6 pg (ref 26.0–34.0)
MCHC: 32.5 g/dL (ref 30.0–36.0)
MCV: 87.8 fL (ref 78.0–100.0)
Monocytes Absolute: 0.4 10*3/uL (ref 0.1–1.0)
Monocytes Relative: 5 %
NEUTROS PCT: 83 %
Neutro Abs: 6.6 10*3/uL (ref 1.7–7.7)
Platelets: 120 10*3/uL — ABNORMAL LOW (ref 150–400)
RBC: 3.85 MIL/uL — ABNORMAL LOW (ref 3.87–5.11)
RDW: 14.1 % (ref 11.5–15.5)
WBC: 8 10*3/uL (ref 4.0–10.5)

## 2016-01-29 LAB — C-REACTIVE PROTEIN: CRP: 1.7 mg/dL — AB (ref <1.0)

## 2016-01-29 LAB — AEROBIC/ANAEROBIC CULTURE (SURGICAL/DEEP WOUND)

## 2016-01-29 LAB — AEROBIC/ANAEROBIC CULTURE W GRAM STAIN (SURGICAL/DEEP WOUND)

## 2016-01-29 MED ORDER — CIPROFLOXACIN HCL 500 MG PO TABS
500.0000 mg | ORAL_TABLET | Freq: Two times a day (BID) | ORAL | Status: DC
  Administered 2016-01-29 – 2016-01-30 (×2): 500 mg via ORAL
  Filled 2016-01-29 (×2): qty 1

## 2016-01-29 MED ORDER — BUDESONIDE 3 MG PO CPEP
9.0000 mg | ORAL_CAPSULE | Freq: Every day | ORAL | Status: DC
  Administered 2016-01-30: 9 mg via ORAL
  Filled 2016-01-29 (×2): qty 3

## 2016-01-29 MED ORDER — METRONIDAZOLE 500 MG PO TABS
500.0000 mg | ORAL_TABLET | Freq: Three times a day (TID) | ORAL | Status: DC
  Administered 2016-01-29 – 2016-01-30 (×3): 500 mg via ORAL
  Filled 2016-01-29 (×3): qty 1

## 2016-01-29 NOTE — Progress Notes (Signed)
PROGRESS NOTE    Sara Barnes  NLZ:[id number] DOB: 07-03-1940 DOA: 01/07/2016 PCP: Ann Held, DO   Chief Complaint  Patient presents with  . Rectal Bleeding    SINCE FRIDAY     Brief Narrative:  HPI on 01/07/2016 by Dr. Gilman Schmidt [name] a 75 y.o.femalewith medical history significant of ulcerative colitis versus Crohn's disease, believed to be UC eventually,otherwise healthy, presents to the emergency room with chief complaint of severe abdominal pain for the past several days. She was recently hospitalized and discharged home about 9 days prior to admission. She was here in the hospital with similar complaints, placed on steroids, and tells me today that she improved some and really wanted to go home. At home, she has had ongoing symptoms, never really well achieved full remission, and over the last few days she has felt extremely unwell. She has crampy abdominal pain, nausea, poor appetite, barely able to keep down some Gatorade, and has been having significant amounts of bloody bowel movements as well. She has no fever or chills. She has no chest pain or shortness of breath. She denies any palpitations. She has no lightheadedness or dizziness. She feels generalized weakness. In the emergency room her vital signs are stable, her blood work is essentially unremarkable. Gastroenterology was consulted by EDP and have evaluated patient, and TRH was asked for admission  Interim history Gastroenterology consultation appreciated. Patient underwent flexible sigmoidoscopy. CMV was negative and she was started on Remicade. She was initially doing better and tolerating some diet, GI following and since still having a lot of pain and frequent BMs will be dosed with another Remicaide infusion on  9/17. 9/21 pain and bloody clots worsened overnight. CT abd/pelvis showed perirectal abscess- General surgery Was consulted. Patient underwent I&D on 9/26. Steroids were decreased per  GI recommendations. Patient received dose of amikacin on 01/28/2016. Clinically continuing to improve. Patient is working with physical therapy  Assessment & Plan   Abdominal pain secondary to Ulcerative colitis flare with rectal bleeding/ Perianal abscess -Patient currently followed by Gastroenterology and surgery -Patient is s/p flexible sigmoidoscopy on 01/08/16, biopsy with findings of active erosive ulcer. No CMV found. Has been given Remicade on 01/09/2016, 01/13/16, and 01/28/2016. -For now, patient remains on zosyn that was started as of 9/26. Hopefully can transition to oral antibiotics soon. Possibility for transition to by mouth antibiotics after discussing with general surgery. Discussed case with gastroenterology who states patient may transition to by mouth Cipro and by mouth Flagyl today after completing just over 1 week of IV Zosyn. -Patient is status post I&D of abscess. -IV Solu-Medrol continued at 15 mg every 12 hours, which will be continued per gastroenterology -Patient does continue to complain of significant pain. Patient continues to tolerate diet. Still with multiple soft formed stools  Acute blood loss anemia/Symptomatic anemia -Likely secondary to the above -hemoglobin remained stable today at 11  -CBC will be repeated in the morning  Diarrhea secondary to UC flare -Cdiff noted to be negative x2 -Patient is clinically improving, albeit slowly  Depression/Anxiety -Prozac has been continued and patient is tolerating  Debility -PT/OT has been consulted -Recommendations have been made for skilled nursing facility  Thrombocytopenia -Lovenox was held on 01/26/2016 secondary to thrombocytopenia below 1000 -Had started SCDs for DVT prophylaxis -Platelets have improved 102,000 -Continue Lovenox as tolerated   DVT Prophylaxis   Lovenox  Code Status: Full  Family Communication: Patient in room, family at bedside  Disposition Plan:  anticipate skilled nursing  facility. Barriers to discharge include continued IV Solu-Medrol and pain management.  Consultants Gastroenterology  General surgery  Procedures  Flexible sigmoidoscopy Incision and drainage. No abscess on 01/22/2016  Antibiotics   Anti-infectives    Start     Dose/Rate Route Frequency Ordered Stop   01/22/16 0900  piperacillin-tazobactam (ZOSYN) IVPB 3.375 g     3.375 g 12.5 mL/hr over 240 Minutes Intravenous Every 8 hours 01/22/16 0840     01/21/16 1830  ciprofloxacin (CIPRO) IVPB 400 mg  Status:  Discontinued     400 mg 200 mL/hr over 60 Minutes Intravenous Every 12 hours 01/21/16 1744 01/22/16 0840   01/21/16 1830  metroNIDAZOLE (FLAGYL) IVPB 500 mg  Status:  Discontinued     500 mg 100 mL/hr over 60 Minutes Intravenous Every 6 hours 01/21/16 1744 01/22/16 0840      Subjective:   Still complains of residual pain. Otherwise patient does report some improvement since receiving Remicade yesterday  Objective:   Vitals:   01/28/16 1440 01/28/16 2058 01/29/16 0510 01/29/16 1331  BP: 122/72 138/74 105/61 (!) 98/50  Pulse: 82 69 69 75  Resp: 16 16 18 18   Temp: 98.4 F (36.9 C) 97.5 F (36.4 C) 97.7 F (36.5 C) 97.6 F (36.4 C)  TempSrc: Oral Oral Oral Oral  SpO2: 99% 100% 98% 99%  Weight:      Height:        Intake/Output Summary (Last 24 hours) at 01/29/16 1449 Last data filed at 01/29/16 0730  Gross per 24 hour  Intake          1489.99 ml  Output                0 ml  Net          1489.99 ml   Filed Weights   01/07/16 1136  Weight: 72.6 kg (160 lb)    Exam  General:  Awake, in no acute distress  HEENT: Pupils equal round reactive to light, dentition fair  Cardiovascular: Regular rate, S1-S2  Respiratory: Normal chest rise, no crackles   Abdomen: Positive bowel sounds, generally tender  Extremities: No cyanosis, no joint deformities  Neuro: Cranial nerves II through XII grossly intact, sensation intact  Psych: Normal affect, no auditory  hallucinations  Skin: No pallor, no rashes   Data Reviewed: I have personally reviewed following labs and imaging studies  CBC:  Recent Labs Lab 01/25/16 0607 01/26/16 0542 01/27/16 0519 01/28/16 0454 01/29/16 0543  WBC 5.7 5.2 4.8 4.7 8.0  NEUTROABS 5.2 4.5 4.1 3.6 6.6  HGB 10.8* 11.5* 11.2* 10.2* 11.0*  HCT 33.2* 33.9* 34.4* 31.2* 33.8*  MCV 90.7 87.6 90.8 87.6 87.8  PLT 109* 96* 100* 101* 425*   Basic Metabolic Panel:  Recent Labs Lab 01/23/16 0605 01/24/16 0601 01/25/16 0607 01/26/16 0542  NA 135 135 134* 135  K 4.4 4.1 4.6 4.2  CL 102 102 100* 98*  CO2 26 27 27 30   GLUCOSE 101* 100* 93 91  BUN 11 12 10 8   CREATININE 0.60 0.56 0.46 0.44  CALCIUM 7.9* 8.0* 8.1* 8.0*   GFR: Estimated Creatinine Clearance: 62.9 mL/min (by C-G formula based on SCr of 0.44 mg/dL). Liver Function Tests:  Recent Labs Lab 01/23/16 0605 01/24/16 0601 01/25/16 0607 01/26/16 0542  AST 11* 10* 15 22  ALT 13* 12* 15 21  ALKPHOS 61 54 57 62  BILITOT 0.5 0.3 0.4 0.4  PROT 4.3* 4.6* 4.8* 5.0*  ALBUMIN  1.8* 1.8* 1.8* 2.0*   No results for input(s): LIPASE, AMYLASE in the last 168 hours. No results for input(s): AMMONIA in the last 168 hours. Coagulation Profile: No results for input(s): INR, PROTIME in the last 168 hours. Cardiac Enzymes: No results for input(s): CKTOTAL, CKMB, CKMBINDEX, TROPONINI in the last 168 hours. BNP (last 3 results) No results for input(s): PROBNP in the last 8760 hours. HbA1C: No results for input(s): HGBA1C in the last 72 hours. CBG: No results for input(s): GLUCAP in the last 168 hours. Lipid Profile: No results for input(s): CHOL, HDL, LDLCALC, TRIG, CHOLHDL, LDLDIRECT in the last 72 hours. Thyroid Function Tests: No results for input(s): TSH, T4TOTAL, FREET4, T3FREE, THYROIDAB in the last 72 hours. Anemia Panel: No results for input(s): VITAMINB12, FOLATE, FERRITIN, TIBC, IRON, RETICCTPCT in the last 72 hours. Urine analysis:    Component  Value Date/Time   COLORURINE YELLOW 04/19/2015 0838   APPEARANCEUR CLOUDY (A) 04/19/2015 0838   LABSPEC 1.013 04/19/2015 0838   PHURINE 6.0 04/19/2015 0838   GLUCOSEU NEGATIVE 04/19/2015 0838   HGBUR MODERATE (A) 04/19/2015 0838   HGBUR small 12/13/2009 0904   BILIRUBINUR NEGATIVE 04/19/2015 0838   BILIRUBINUR ne 01/30/2015 1543   KETONESUR 40 (A) 04/19/2015 0838   PROTEINUR NEGATIVE 04/19/2015 0838   UROBILINOGEN 0.2 01/30/2015 1543   UROBILINOGEN 0.2 09/25/2013 1350   NITRITE NEGATIVE 04/19/2015 0838   LEUKOCYTESUR TRACE (A) 04/19/2015 0838   Sepsis Labs: @LABRCNTIP (procalcitonin:4,lacticidven:4)  ) Recent Results (from the past 240 hour(s))  C difficile quick scan w PCR reflex     Status: None   Collection Time: 01/21/16  4:43 PM  Result Value Ref Range Status   C Diff antigen NEGATIVE NEGATIVE Final   C Diff toxin NEGATIVE NEGATIVE Final   C Diff interpretation No C. difficile detected.  Final  MRSA PCR Screening     Status: None   Collection Time: 01/22/16 10:55 AM  Result Value Ref Range Status   MRSA by PCR NEGATIVE NEGATIVE Final    Comment:        The GeneXpert MRSA Assay (FDA approved for NASAL specimens only), is one component of a comprehensive MRSA colonization surveillance program. It is not intended to diagnose MRSA infection nor to guide or monitor treatment for MRSA infections.   Aerobic/Anaerobic Culture (surgical/deep wound)     Status: None   Collection Time: 01/22/16  1:17 PM  Result Value Ref Range Status   Specimen Description ABSCESS PERIRECTAL  Final   Special Requests NONE  Final   Gram Stain   Final    FEW WBC PRESENT,BOTH PMN AND MONONUCLEAR MANY GRAM POSITIVE COCCI IN PAIRS AND CHAINS MANY GRAM NEGATIVE RODS MANY GRAM POSITIVE RODS MODERATE GRAM NEGATIVE COCCOBACILLI RARE YEAST Performed at [facility]    Culture MULTIPLE ORGANISMS PRESENT, NONE PREDOMINANT  Final   Report Status 01/29/2016 FINAL  Final      Radiology  Studies: No results found.   Scheduled Meds: . dicyclomine  10 mg Oral TID AC & HS  . enoxaparin (LOVENOX) injection  40 mg Subcutaneous Q24H  . FLUoxetine  40 mg Oral Daily  . methylPREDNISolone (SOLU-MEDROL) injection  15.2 mg Intravenous Q12H  . midazolam  2 mg Intravenous Once  . oxymorphone  10 mg Oral QAC breakfast  . oxymorphone  10 mg Oral QHS  . piperacillin-tazobactam (ZOSYN)  IV  3.375 g Intravenous Q8H   Continuous Infusions: . sodium chloride 50 mL/hr at 01/29/16 0600  LOS: 21 days    CHIU, STEPHEN K  MD. on 01/29/2016 at 2:49 PM  Between 7am to 7pm - Pager - [phone]  After 7pm go to [email or web address] - password [email or web address]  And look for the night coverage person covering for me after hours  [facility] Office  [phone]

## 2016-01-29 NOTE — Progress Notes (Signed)
[facility] Progress Note  7 Days Post-Op  Subjective: Comfortable, laying in bed. NAE overnight. Pain controlled, worse with movement. States her sister will be helping her shower today and she plans to take at least 2 sitz baths. Denies nausea, vomiting, fevers, chills.  Objective: Vital signs in last 24 hours: Temp:  [97.5 F (36.4 C)-98.6 F (37 C)] 97.7 F (36.5 C) (10/03 0510) Pulse Rate:  [69-82] 69 (10/03 0510) Resp:  [16-18] 18 (10/03 0510) BP: (104-138)/(51-74) 105/61 (10/03 0510) SpO2:  [97 %-100 %] 98 % (10/03 0510) Last BM Date: 01/29/16  Intake/Output from previous day: 10/02 0701 - 10/03 0700 In: 1910 [P.O.:360; I.V.:1150; IV Piggyback:400] Out: 701 [Urine:700; Drains:1] Intake/Output this shift: No intake/output data recorded.  PE: Gen:  Alert, NAD, pleasant Perineum: significant bloody drainage lining mesh underwear, penrose intact   Lab Results:   Recent Labs  01/28/16 0454 01/29/16 0543  WBC 4.7 8.0  HGB 10.2* 11.0*  HCT 31.2* 33.8*  PLT 101* 120*   CMP     Component Value Date/Time   NA 135 01/26/2016 0542   NA 138 12/06/2014 0804   K 4.2 01/26/2016 0542   K 4.1 12/06/2014 0804   CL 98 (L) 01/26/2016 0542   CO2 30 01/26/2016 0542   CO2 25 12/06/2014 0804   GLUCOSE 91 01/26/2016 0542   GLUCOSE 92 12/06/2014 0804   BUN 8 01/26/2016 0542   BUN 12.3 12/06/2014 0804   CREATININE 0.44 01/26/2016 0542   CREATININE 0.8 12/06/2014 0804   CALCIUM 8.0 (L) 01/26/2016 0542   CALCIUM 9.4 12/06/2014 0804   PROT 5.0 (L) 01/26/2016 0542   PROT 6.5 12/06/2014 0804   ALBUMIN 2.0 (L) 01/26/2016 0542   ALBUMIN 3.3 (L) 12/06/2014 0804   AST 22 01/26/2016 0542   AST 9 12/06/2014 0804   ALT 21 01/26/2016 0542   ALT 10 12/06/2014 0804   ALKPHOS 62 01/26/2016 0542   ALKPHOS 89 12/06/2014 0804   BILITOT 0.4 01/26/2016 0542   BILITOT 0.37 12/06/2014 0804   GFRNONAA >60 01/26/2016 0542   GFRAA >60 01/26/2016 0542   Lipase     Component  Value Date/Time   LIPASE 31 04/15/2011 1700   Anti-infectives: Anti-infectives    Start     Dose/Rate Route Frequency Ordered Stop   01/22/16 0900  piperacillin-tazobactam (ZOSYN) IVPB 3.375 g     3.375 g 12.5 mL/hr over 240 Minutes Intravenous Every 8 hours 01/22/16 0840     01/21/16 1830  ciprofloxacin (CIPRO) IVPB 400 mg  Status:  Discontinued     400 mg 200 mL/hr over 60 Minutes Intravenous Every 12 hours 01/21/16 1744 01/22/16 0840   01/21/16 1830  metroNIDAZOLE (FLAGYL) IVPB 500 mg  Status:  Discontinued     500 mg 100 mL/hr over 60 Minutes Intravenous Every 6 hours 01/21/16 1744 01/22/16 0840     Assessment/Plan POD#6 S/p incision, drainage, and packing of complex perirectal abscess - packing removed POD#1, penrose drain in place to allow for continued drainage and control of possible fistulae - IV abx, WBC WNL - Remecade, steroids per GI - TID sitz baths, showers for wound care (mild soap/warm water) - encourage mobilization with PT  Plans to go to SNF after discharge from the hospital   LOS: 21 days    Jill Alexanders , [facility] 01/29/2016, 9:19 AM Pager: [phone] Consults: [phone] Mon-Fri 7:00 am-4:30 pm Sat-Sun 7:00 am-11:30 am

## 2016-01-29 NOTE — Progress Notes (Signed)
Patient ID: DARLYS BUIS, female   DOB: 02-Jan-1941, 75 y.o.   MRN: [id number]    Progress Note   Subjective   Pain better controlled past 24 hours and had a better night - still c/o severe rectal pain worse with any type of movement, walking ,sitz baths etc.. Less frequent episodes of tenesmus and stooling -still incontinent most episodes, seeing small amts of blood Received Remicade  #2  yesterday  CRP 1.1    Objective   Vital signs in last 24 hours: Temp:  [97.5 F (36.4 C)-98.6 F (37 C)] 97.7 F (36.5 C) (10/03 0510) Pulse Rate:  [69-82] 69 (10/03 0510) Resp:  [16-18] 18 (10/03 0510) BP: (104-138)/(51-74) 105/61 (10/03 0510) SpO2:  [97 %-100 %] 98 % (10/03 0510) Last BM Date: 01/29/16 General:    white female in NAD Heart:  Regular rate and rhythm; no murmurs Lungs: Respirations even and unlabored, lungs CTA bilaterally Abdomen:  Soft, nontender and nondistended. Normal bowel sounds. Extremities:  Without edema. Neurologic:  Alert and oriented,  grossly normal neurologically. Psych:  Cooperative. Normal mood and affect.  Intake/Output from previous day: 10/02 0701 - 10/03 0700 In: 1910 [P.O.:360; I.V.:1150; IV Piggyback:400] Out: 701 [Urine:700; Drains:1] Intake/Output this shift: No intake/output data recorded.  Lab Results:  Recent Labs  01/27/16 0519 01/28/16 0454 01/29/16 0543  WBC 4.8 4.7 8.0  HGB 11.2* 10.2* 11.0*  HCT 34.4* 31.2* 33.8*  PLT 100* 101* 120*   BMET No results for input(s): NA, K, CL, CO2, GLUCOSE, BUN, CREATININE, CALCIUM in the last 72 hours. LFT No results for input(s): PROT, ALBUMIN, AST, ALT, ALKPHOS, BILITOT, BILIDIR, IBILI in the last 72 hours. PT/INR No results for input(s): LABPROT, INR in the last 72 hours.  Studies/Results: No results found.     Assessment / Plan:    #1 75 yo female with Crohns disease /severe colitis  And proctitis  With development of large complex perirectal ulcer while on high dose steroids  S/P  drainage of large abscess day #6 - drain to remain  in place , remains   on ABX , sitz baths  Received Remicade #2 induction  10 mg/kg yesterday  Continue Solumedrol  30  Daily, will decrease to 20  Tomorrow Continue Lovenox- follow hgb  Anticipate need for rehab stay on discharge- hopefully  Not before drain removed     Active Problems:   Ulcerative pancolitis with rectal bleeding (HCC)   Abdominal pain   GI bleed   Left upper quadrant pain   Absolute anemia   Indeterminate colitis   Acute blood loss anemia   Difficulty in walking, not elsewhere classified   Other depression due to general medical condition   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

## 2016-01-30 ENCOUNTER — Inpatient Hospital Stay (HOSPITAL_COMMUNITY): Payer: Commercial Managed Care - HMO

## 2016-01-30 DIAGNOSIS — R509 Fever, unspecified: Secondary | ICD-10-CM

## 2016-01-30 DIAGNOSIS — R5081 Fever presenting with conditions classified elsewhere: Secondary | ICD-10-CM

## 2016-01-30 DIAGNOSIS — E538 Deficiency of other specified B group vitamins: Secondary | ICD-10-CM

## 2016-01-30 LAB — BASIC METABOLIC PANEL
Anion gap: 5 (ref 5–15)
BUN: 7 mg/dL (ref 6–20)
CALCIUM: 8.1 mg/dL — AB (ref 8.9–10.3)
CO2: 28 mmol/L (ref 22–32)
CREATININE: 0.51 mg/dL (ref 0.44–1.00)
Chloride: 101 mmol/L (ref 101–111)
GFR calc non Af Amer: >60 mL/min (ref >60)
GLUCOSE: 76 mg/dL (ref 65–99)
Potassium: 3.8 mmol/L (ref 3.5–5.1)
Sodium: 134 mmol/L — ABNORMAL LOW (ref 135–145)

## 2016-01-30 LAB — CBC WITH DIFFERENTIAL/PLATELET
BASOS PCT: 0 %
Basophils Absolute: 0 10*3/uL (ref 0.0–0.1)
Eosinophils Absolute: 0 10*3/uL (ref 0.0–0.7)
Eosinophils Relative: 1 %
HEMATOCRIT: 32.4 % — AB (ref 36.0–46.0)
Hemoglobin: 10.6 g/dL — ABNORMAL LOW (ref 12.0–15.0)
LYMPHS ABS: 1.1 10*3/uL (ref 0.7–4.0)
LYMPHS PCT: 19 %
MCH: 29.4 pg (ref 26.0–34.0)
MCHC: 32.7 g/dL (ref 30.0–36.0)
MCV: 90 fL (ref 78.0–100.0)
MONO ABS: 0.3 10*3/uL (ref 0.1–1.0)
MONOS PCT: 6 %
NEUTROS ABS: 4.3 10*3/uL (ref 1.7–7.7)
NEUTROS PCT: 74 %
Platelets: 133 10*3/uL — ABNORMAL LOW (ref 150–400)
RBC: 3.6 MIL/uL — ABNORMAL LOW (ref 3.87–5.11)
RDW: 14.4 % (ref 11.5–15.5)
WBC: 5.8 10*3/uL (ref 4.0–10.5)

## 2016-01-30 LAB — URINALYSIS, ROUTINE W REFLEX MICROSCOPIC
BILIRUBIN URINE: NEGATIVE
GLUCOSE, UA: NEGATIVE mg/dL
KETONES UR: NEGATIVE mg/dL
Nitrite: NEGATIVE
PH: 6.5 (ref 5.0–8.0)
Protein, ur: NEGATIVE mg/dL
SPECIFIC GRAVITY, URINE: 1.008 (ref 1.005–1.030)

## 2016-01-30 LAB — URINE MICROSCOPIC-ADD ON

## 2016-01-30 LAB — C-REACTIVE PROTEIN: CRP: 1.8 mg/dL — ABNORMAL HIGH (ref <1.0)

## 2016-01-30 MED ORDER — ACETAMINOPHEN 325 MG PO TABS
650.0000 mg | ORAL_TABLET | Freq: Four times a day (QID) | ORAL | Status: DC | PRN
  Administered 2016-01-30 – 2016-02-24 (×3): 650 mg via ORAL
  Filled 2016-01-30 (×3): qty 2

## 2016-01-30 MED ORDER — ACETAMINOPHEN 325 MG PO TABS: 650.0000 mg | ORAL_TABLET | Freq: Four times a day (QID) | ORAL | Status: DC | PRN

## 2016-01-30 MED ORDER — IOPAMIDOL (ISOVUE-300) INJECTION 61%
100.0000 mL | Freq: Once | INTRAVENOUS | Status: AC | PRN
  Administered 2016-01-30: 100 mL via INTRAVENOUS

## 2016-01-30 MED ORDER — SODIUM CHLORIDE 0.9 % IV SOLN
500.0000 mg | INTRAVENOUS | Status: AC
  Administered 2016-01-30: 500 mg via INTRAVENOUS
  Filled 2016-01-30: qty 500

## 2016-01-30 MED ORDER — SODIUM CHLORIDE 0.9 % IV SOLN
500.0000 mg | Freq: Four times a day (QID) | INTRAVENOUS | Status: DC
  Administered 2016-01-31 (×2): 500 mg via INTRAVENOUS
  Filled 2016-01-30 (×3): qty 500

## 2016-01-30 NOTE — Progress Notes (Signed)
PT Cancellation Note  Patient Details Name: Sara Barnes MRN: [id number] DOB: [birth year]   Cancelled Treatment:    Reason Eval/Treat Not Completed: Pain limiting ability to participate (pt stated she's hurting too much to ambulate now, she's been walking daily in halls with her sister. Will follow. )   Philomena Doheny 01/30/2016, 11:10 AM [phone]

## 2016-01-30 NOTE — Progress Notes (Signed)
PROGRESS NOTE    Sara Barnes  QBH:[id number] DOB: 1940-05-02 DOA: 01/07/2016 PCP: Ann Held, DO   Chief Complaint  Patient presents with  . Rectal Bleeding    SINCE FRIDAY     Brief Narrative:  HPI on 01/07/2016 by Dr. Gilman Schmidt [name] a 75 y.o.femalewith medical history significant of ulcerative colitis versus Crohn's disease, believed to be UC eventually,otherwise healthy, presents to the emergency room with chief complaint of severe abdominal pain for the past several days. She was recently hospitalized and discharged home about 9 days prior to admission. She was here in the hospital with similar complaints, placed on steroids, and tells me today that she improved some and really wanted to go home. At home, she has had ongoing symptoms, never really well achieved full remission, and over the last few days she has felt extremely unwell. She has crampy abdominal pain, nausea, poor appetite, barely able to keep down some Gatorade, and has been having significant amounts of bloody bowel movements as well. She has no fever or chills. She has no chest pain or shortness of breath. She denies any palpitations. She has no lightheadedness or dizziness. She feels generalized weakness. In the emergency room her vital signs are stable, her blood work is essentially unremarkable. Gastroenterology was consulted by EDP and have evaluated patient, and TRH was asked for admission  Interim history Gastroenterology consultation appreciated. Patient underwent flexible sigmoidoscopy. CMV was negative and she was started on Remicade. She was initially doing better and tolerating some diet, GI following and since still having a lot of pain and frequent BMs will be dosed with another Remicaide infusion on  9/17. 9/21 pain and bloody clots worsened overnight. CT abd/pelvis showed perirectal abscess- General surgery Was consulted. Patient underwent I&D on 9/26. Steroids were decreased per  GI recommendations. Patient received dose of amikacin on 01/28/2016. Clinically continuing to improve. Patient is working with physical therapy  Assessment & Plan   Abdominal pain secondary to Ulcerative colitis flare with rectal bleeding/ Perianal abscess -Patient is s/p flexible sigmoidoscopy on 01/08/16, biopsy with findings of active erosive ulcer. No CMV found. Has been given Remicade on 01/09/2016, 01/13/16, and 01/28/2016.Patient is status post I&D of abscess -she was treated with zosynx 1week ,then oral cipro and flagyl, however developed fever on 10/4, abx broadened to imipenem, -.IV Solu-Medrol continued at 15 mg every 12 hours, which will be continued per gastroenterology -repeat blood culture and repeat CT ab/pel -Patient currently followed by Gastroenterology and surgery, appreciate input  Acute blood loss anemia/Symptomatic anemia -Likely secondary to the above -hemoglobin remained stable today at 11  -CBC will be repeated in the morning  Diarrhea secondary to UC flare -Cdiff noted to be negative x2 -Patient is clinically improving, albeit slowly  Depression/Anxiety -Prozac has been continued and patient is tolerating  Debility -PT/OT has been consulted -Recommendations have been made for skilled nursing facility  Thrombocytopenia -Lovenox was held on 01/26/2016 secondary to thrombocytopenia below 1000 -Had started SCDs for DVT prophylaxis -Platelets have improved 102,000 -Continue Lovenox as tolerated   DVT Prophylaxis   Lovenox  Code Status: Full  Family Communication: Patient in room, family at bedside  Disposition Plan:  anticipate skilled nursing facility. Barriers to discharge include continued IV Solu-Medrol and pain management.  Consultants Gastroenterology  General surgery  Procedures  Flexible sigmoidoscopy Incision and drainage. No abscess on 01/22/2016  Antibiotics   Anti-infectives    Start     Dose/Rate Route Frequency  Ordered Stop    01/29/16 2200  metroNIDAZOLE (FLAGYL) tablet 500 mg     500 mg Oral Every 8 hours 01/29/16 1700     01/29/16 2000  ciprofloxacin (CIPRO) tablet 500 mg     500 mg Oral 2 times daily 01/29/16 1700     01/22/16 0900  piperacillin-tazobactam (ZOSYN) IVPB 3.375 g  Status:  Discontinued     3.375 g 12.5 mL/hr over 240 Minutes Intravenous Every 8 hours 01/22/16 0840 01/29/16 1700   01/21/16 1830  ciprofloxacin (CIPRO) IVPB 400 mg  Status:  Discontinued     400 mg 200 mL/hr over 60 Minutes Intravenous Every 12 hours 01/21/16 1744 01/22/16 0840   01/21/16 1830  metroNIDAZOLE (FLAGYL) IVPB 500 mg  Status:  Discontinued     500 mg 100 mL/hr over 60 Minutes Intravenous Every 6 hours 01/21/16 1744 01/22/16 0840      Subjective:   Spiking fever, persistent pain  Objective:   Vitals:   01/30/16 1152 01/30/16 1301 01/30/16 1335 01/30/16 1343  BP:    112/69  Pulse:    100  Resp:    18  Temp: (!) 101.2 F (38.4 C) 99.4 F (37.4 C) (!) 102.4 F (39.1 C) (!) 101.4 F (38.6 C)  TempSrc: Axillary Oral Axillary Oral  SpO2:    100%  Weight:      Height:        Intake/Output Summary (Last 24 hours) at 01/30/16 1355 Last data filed at 01/30/16 1000  Gross per 24 hour  Intake             1560 ml  Output              300 ml  Net             1260 ml   Filed Weights   01/07/16 1136  Weight: 72.6 kg (160 lb)    Exam  General:  Awake, in no acute distress, hard of hearing  HEENT: Pupils equal round reactive to light, dentition fair  Cardiovascular: Regular rate, S1-S2  Respiratory: Normal chest rise, no crackles   Abdomen: Positive bowel sounds, generally tender  Extremities: No cyanosis, no joint deformities  Neuro: Cranial nerves II through XII grossly intact, sensation intact  Psych: Normal affect, no auditory hallucinations  Skin: No pallor, no rashes   Data Reviewed: I have personally reviewed following labs and imaging studies  CBC:  Recent Labs Lab 01/26/16 0542  01/27/16 0519 01/28/16 0454 01/29/16 0543 01/30/16 0612  WBC 5.2 4.8 4.7 8.0 5.8  NEUTROABS 4.5 4.1 3.6 6.6 4.3  HGB 11.5* 11.2* 10.2* 11.0* 10.6*  HCT 33.9* 34.4* 31.2* 33.8* 32.4*  MCV 87.6 90.8 87.6 87.8 90.0  PLT 96* 100* 101* 120* 327*   Basic Metabolic Panel:  Recent Labs Lab 01/24/16 0601 01/25/16 0607 01/26/16 0542 01/30/16 0612  NA 135 134* 135 134*  K 4.1 4.6 4.2 3.8  CL 102 100* 98* 101  CO2 27 27 30 28   GLUCOSE 100* 93 91 76  BUN 12 10 8 7   CREATININE 0.56 0.46 0.44 0.51  CALCIUM 8.0* 8.1* 8.0* 8.1*   GFR: Estimated Creatinine Clearance: 62.9 mL/min (by C-G formula based on SCr of 0.51 mg/dL). Liver Function Tests:  Recent Labs Lab 01/24/16 0601 01/25/16 0607 01/26/16 0542  AST 10* 15 22  ALT 12* 15 21  ALKPHOS 54 57 62  BILITOT 0.3 0.4 0.4  PROT 4.6* 4.8* 5.0*  ALBUMIN 1.8* 1.8* 2.0*  No results for input(s): LIPASE, AMYLASE in the last 168 hours. No results for input(s): AMMONIA in the last 168 hours. Coagulation Profile: No results for input(s): INR, PROTIME in the last 168 hours. Cardiac Enzymes: No results for input(s): CKTOTAL, CKMB, CKMBINDEX, TROPONINI in the last 168 hours. BNP (last 3 results) No results for input(s): PROBNP in the last 8760 hours. HbA1C: No results for input(s): HGBA1C in the last 72 hours. CBG: No results for input(s): GLUCAP in the last 168 hours. Lipid Profile: No results for input(s): CHOL, HDL, LDLCALC, TRIG, CHOLHDL, LDLDIRECT in the last 72 hours. Thyroid Function Tests: No results for input(s): TSH, T4TOTAL, FREET4, T3FREE, THYROIDAB in the last 72 hours. Anemia Panel: No results for input(s): VITAMINB12, FOLATE, FERRITIN, TIBC, IRON, RETICCTPCT in the last 72 hours. Urine analysis:    Component Value Date/Time   COLORURINE YELLOW 04/19/2015 0838   APPEARANCEUR CLOUDY (A) 04/19/2015 0838   LABSPEC 1.013 04/19/2015 0838   PHURINE 6.0 04/19/2015 0838   GLUCOSEU NEGATIVE 04/19/2015 0838   HGBUR  MODERATE (A) 04/19/2015 0838   HGBUR small 12/13/2009 0904   BILIRUBINUR NEGATIVE 04/19/2015 0838   BILIRUBINUR ne 01/30/2015 1543   KETONESUR 40 (A) 04/19/2015 0838   PROTEINUR NEGATIVE 04/19/2015 0838   UROBILINOGEN 0.2 01/30/2015 1543   UROBILINOGEN 0.2 09/25/2013 1350   NITRITE NEGATIVE 04/19/2015 0838   LEUKOCYTESUR TRACE (A) 04/19/2015 0838   Sepsis Labs: @LABRCNTIP (procalcitonin:4,lacticidven:4)  ) Recent Results (from the past 240 hour(s))  C difficile quick scan w PCR reflex     Status: None   Collection Time: 01/21/16  4:43 PM  Result Value Ref Range Status   C Diff antigen NEGATIVE NEGATIVE Final   C Diff toxin NEGATIVE NEGATIVE Final   C Diff interpretation No C. difficile detected.  Final  MRSA PCR Screening     Status: None   Collection Time: 01/22/16 10:55 AM  Result Value Ref Range Status   MRSA by PCR NEGATIVE NEGATIVE Final    Comment:        The GeneXpert MRSA Assay (FDA approved for NASAL specimens only), is one component of a comprehensive MRSA colonization surveillance program. It is not intended to diagnose MRSA infection nor to guide or monitor treatment for MRSA infections.   Aerobic/Anaerobic Culture (surgical/deep wound)     Status: None   Collection Time: 01/22/16  1:17 PM  Result Value Ref Range Status   Specimen Description ABSCESS PERIRECTAL  Final   Special Requests NONE  Final   Gram Stain   Final    FEW WBC PRESENT,BOTH PMN AND MONONUCLEAR MANY GRAM POSITIVE COCCI IN PAIRS AND CHAINS MANY GRAM NEGATIVE RODS MANY GRAM POSITIVE RODS MODERATE GRAM NEGATIVE COCCOBACILLI RARE YEAST Performed at [facility]    Culture MULTIPLE ORGANISMS PRESENT, NONE PREDOMINANT  Final   Report Status 01/29/2016 FINAL  Final      Radiology Studies: No results found.   Scheduled Meds: . budesonide  9 mg Oral Daily  . ciprofloxacin  500 mg Oral BID  . dicyclomine  10 mg Oral TID AC & HS  . FLUoxetine  40 mg Oral Daily  . metroNIDAZOLE   500 mg Oral Q8H  . midazolam  2 mg Intravenous Once  . oxymorphone  10 mg Oral QAC breakfast  . oxymorphone  10 mg Oral QHS   Continuous Infusions: . sodium chloride 50 mL/hr at 01/30/16 0600     LOS: 22 days    Blayke Cordrey  MD. PhD on 01/30/2016  at 1:55 PM  Between 7am to 7pm - Pager - [phone]  After 7pm go to [email or web address] - password [email or web address]  And look for the night coverage person covering for me after hours  [facility] Office  [phone]

## 2016-01-30 NOTE — Progress Notes (Signed)
Pharmacy Antibiotic Note  Sara Barnes is a 75 y.o. female admitted on 01/07/2016 with refractory ulcerative colitis with rectal bleeding, started on IV solu-medrol and Remicade. CT of abdomen and pelvis done on 01/21/16 showed growing perianal abscess and fistula, and patient underwent I&D on 01/22/16. Patient initially started on Cipro/Flagyl, then transitioned to IV Zosyn for ~ 1 week, then transitioned to PO Cipro/Flagyl on 01/29/16. Today, patient developed fever up to 102.31F, and antibiotics broadened to Primaxin per pharmacy dosing.   Plan: Primaxin 537m IV q6h. Monitor renal function, cultures, clinical course.  Height: 5' 6"  (167.6 cm) Weight: 160 lb (72.6 kg) IBW/kg (Calculated) : 59.3  Temp (24hrs), Avg:100.1 F (37.8 C), Min:98.1 F (36.7 C), Max:102.4 F (39.1 C)   Recent Labs Lab 01/24/16 0601 01/25/16 0607 01/26/16 0542 01/27/16 0519 01/28/16 0454 01/29/16 0543 01/30/16 0612  WBC 7.8 5.7 5.2 4.8 4.7 8.0 5.8  CREATININE 0.56 0.46 0.44  --   --   --  0.51    Estimated Creatinine Clearance: 62.9 mL/min (by C-G formula based on SCr of 0.51 mg/dL).    Allergies  Allergen Reactions  . Codeine Nausea And Vomiting  . Mesalamine Nausea And Vomiting  . Other Itching    PERFUMED LOTIONS  Cant have MRI due to ear implant   . Oxycodone-Acetaminophen Rash    Itching & rash Tolerates Dilaudid  . Sulfa Antibiotics Rash    Antimicrobials this admission: 9/25 >> Cipro >> 9/26, 10/4 >> 10/4 9/25 >> Flagyl >> 9/26, 10/4 >> 10/4 9/26 >> Zosyn >> 10/3 10/4 >> Primaxin >>  Dose adjustments this admission: ---  Microbiology results: 9/11, 9/25 C.diff PCR: negative 9/26 Peri-rectal abscess: multiple organisms, none predominant 9/26 MRSA PCR: negative 104 BCx: sent 10/4 UCx: sent   Thank you for allowing pharmacy to be a part of this patient's care.   JLindell Spar PharmD, BCPS Pager: [phone]/07/2015 6:41 PM

## 2016-01-30 NOTE — Progress Notes (Signed)
Patient ID: Sara Barnes, female   DOB: 10-Oct-1940, 75 y.o.   MRN: [id number]    Progress Note   Subjective   Not doing well today , pain worse past 24 hours or so- in tears-" I cant handle the pain, but I  Am trying"- pain is worse with sitz baths and walking  She also has been bleeding more - nurses report stool and blood oozing from rectum ,blood from around drain  She has also developed some bruising on left hip and right lateral lower rib area. Hgb10.6 this am   Objective   Vital signs in last 24 hours: Temp:  [97.6 F (36.4 C)-98.2 F (36.8 C)] 98.2 F (36.8 C) (10/04 0622) Pulse Rate:  [65-75] 68 (10/04 0622) Resp:  [16-18] 18 (10/04 0622) BP: (98-117)/(50-75) 111/56 (10/04 0622) SpO2:  [98 %-99 %] 98 % (10/04 0622) Last BM Date: 01/30/16 General:    white female in NAD, in tears  Heart:  Regular rate and rhythm; no murmurs Lungs: Respirations even and unlabored, lungs CTA bilaterally bruising along right lateral lower ribs Abdomen:  Soft, nontender and nondistended. Normal bowel sounds.bruising over left hip Rectal;- large amt of liquid stool and blood oozing from rectum , blood /mucoid material oozing from around drain Extremities:  Without edema. Neurologic:  Alert and oriented,  grossly normal neurologically. Psych:  Cooperative. Normal mood and affect.  Intake/Output from previous day: 10/03 0701 - 10/04 0700 In: 1920 [P.O.:720; I.V.:1150; IV Piggyback:50] Out: 600 [Urine:600] Intake/Output this shift: Total I/O In: 120 [P.O.:120] Out: -   Lab Results:  Recent Labs  01/28/16 0454 01/29/16 0543 01/30/16 0612  WBC 4.7 8.0 5.8  HGB 10.2* 11.0* 10.6*  HCT 31.2* 33.8* 32.4*  PLT 101* 120* 133*   BMET  Recent Labs  01/30/16 0612  NA 134*  K 3.8  CL 101  CO2 28  GLUCOSE 76  BUN 7  CREATININE 0.51  CALCIUM 8.1*   LFT No results for input(s): PROT, ALBUMIN, AST, ALT, ALKPHOS, BILITOT, BILIDIR, IBILI in the last 72 hours. PT/INR No results for  input(s): LABPROT, INR in the last 72 hours.  Studies/Results: No results found.     Assessment / Plan:    #1 75 yo female with severe Crohns colitis with deep rectal ulcers (no improvement at time of last flex 9/12), and development of large complex perirectal abscess while on IV  steroids -s/p I/D 9/26  Drain remains in  Her pain is not improving , seems to be worse last couple days Copious  drainage of stool/blood from rectum   S/P Remicade 10/2 Steroids being weaned- started Budesonide yesterday  Surgery notes from yesterday indicate no need for continued ABX from surgery standpoint- needs to make decision about abx  Will discuss repeat imaging of pelvis , and /or repeat flex  Lovenox has worsened bleeding - going to d/c today , use compressive device, continue ambulation. She cannot leave hospital , would not anticipate d/c to rehab this week  Active Problems:   Ulcerative pancolitis with rectal bleeding (HCC)   Abdominal pain   GI bleed   Left upper quadrant pain   Absolute anemia   Indeterminate colitis   Acute blood loss anemia   Difficulty in walking, not elsewhere classified   Other depression due to general medical condition   Crohn's disease of large intestine with abscess ([place])     LOS: 22 days   Sara Barnes  01/30/2016, 9:24 AM

## 2016-01-30 NOTE — Care Management Important Message (Signed)
Important Message  Patient Details  Name: VUNG KUSH MRN: [id number] Date of Birth: Oct 22, 1940   Medicare Important Message Given:  Yes    [name] 01/30/2016, 2:06 [place] Message  Patient Details  Name: JUDEE HENNICK MRN: [id number] Date of Birth: 09-13-1940   Medicare Important Message Given:  Yes    [name] 01/30/2016, 2:05 [place] Message  Patient Details  Name: OTHEL DICOSTANZO MRN: [id number] Date of Birth: 03/19/1941   Medicare Important Message Given:  Yes    [name] 01/30/2016, 2:05 PM

## 2016-01-30 NOTE — Progress Notes (Signed)
[facility] Progress Note  8 Days Post-Op  Subjective: Pt sister in room. Reports patient had a good day yesterday - shower, 2 sitz baths, in good spirits. Today the patient has had chills, night sweats, and fever. Having flatus and BMs. Denies emesis   TMAX-101.2 @ 11:52 AM  Objective: Vital signs in last 24 hours: Temp:  [97.6 F (36.4 C)-101.2 F (38.4 C)] 101.2 F (38.4 C) (10/04 1152) Pulse Rate:  [65-75] 68 (10/04 0622) Resp:  [16-18] 18 (10/04 0622) BP: (98-117)/(50-75) 111/56 (10/04 0622) SpO2:  [98 %-99 %] 98 % (10/04 0622) Last BM Date: 01/30/16  Intake/Output from previous day: 10/03 0701 - 10/04 0700 In: 1920 [P.O.:720; I.V.:1150; IV Piggyback:50] Out: 600 [Urine:600] Intake/Output this shift: Total I/O In: 120 [P.O.:120] Out: 300 [Urine:300]  PE: Gen:  Alert, NAD, pleasant Perineum: significant bloody drainage lining mesh underwear, penrose intact and draining appropriately.   Lab Results:   Recent Labs  01/29/16 0543 01/30/16 0612  WBC 8.0 5.8  HGB 11.0* 10.6*  HCT 33.8* 32.4*  PLT 120* 133*   BMET  Recent Labs  01/30/16 0612  NA 134*  K 3.8  CL 101  CO2 28  GLUCOSE 76  BUN 7  CREATININE 0.51  CALCIUM 8.1*   C Anti-infectives: Anti-infectives    Start     Dose/Rate Route Frequency Ordered Stop   01/29/16 2200  metroNIDAZOLE (FLAGYL) tablet 500 mg     500 mg Oral Every 8 hours 01/29/16 1700     01/29/16 2000  ciprofloxacin (CIPRO) tablet 500 mg     500 mg Oral 2 times daily 01/29/16 1700     01/22/16 0900  piperacillin-tazobactam (ZOSYN) IVPB 3.375 g  Status:  Discontinued     3.375 g 12.5 mL/hr over 240 Minutes Intravenous Every 8 hours 01/22/16 0840 01/29/16 1700   01/21/16 1830  ciprofloxacin (CIPRO) IVPB 400 mg  Status:  Discontinued     400 mg 200 mL/hr over 60 Minutes Intravenous Every 12 hours 01/21/16 1744 01/22/16 0840   01/21/16 1830  metroNIDAZOLE (FLAGYL) IVPB 500 mg  Status:  Discontinued     500 mg 100  mL/hr over 60 Minutes Intravenous Every 6 hours 01/21/16 1744 01/22/16 0840     Assessment/Plan POD#7 S/p incision, drainage, and packing of complex perirectal abscess - packing removed POD#1, penrose drain in place to allow for continued drainage and control of possible fistulae - may d/c IV abx from surgical standpoint, WBC WNL (5.8) - Remecade, steroids per GI - TID sitz baths, showers for wound care (mild soap/warm water) - encourage mobilization with PT  - discussed a diverting colostomy with the patient as an option for promoting wound healing and decreasing pain/contamination. Patient wishes to hold off on surgical intervention at this time.   LOS: 22 days    Jill Alexanders , [facility] 01/30/2016, 12:42 PM Pager: [phone] Consults: [phone] Mon-Fri 7:00 am-4:30 pm Sat-Sun 7:00 am-11:30 am

## 2016-01-31 LAB — CBC WITH DIFFERENTIAL/PLATELET
BASOS ABS: 0 10*3/uL (ref 0.0–0.1)
Basophils Relative: 0 %
EOS ABS: 0 10*3/uL (ref 0.0–0.7)
Eosinophils Relative: 0 %
HCT: 31.3 % — ABNORMAL LOW (ref 36.0–46.0)
Hemoglobin: 10.4 g/dL — ABNORMAL LOW (ref 12.0–15.0)
LYMPHS ABS: 1.1 10*3/uL (ref 0.7–4.0)
Lymphocytes Relative: 15 %
MCH: 29.6 pg (ref 26.0–34.0)
MCHC: 33.2 g/dL (ref 30.0–36.0)
MCV: 89.2 fL (ref 78.0–100.0)
Monocytes Absolute: 0.4 10*3/uL (ref 0.1–1.0)
Monocytes Relative: 6 %
NEUTROS ABS: 5.6 10*3/uL (ref 1.7–7.7)
Neutrophils Relative %: 79 %
Platelets: 155 10*3/uL (ref 150–400)
RBC: 3.51 MIL/uL — ABNORMAL LOW (ref 3.87–5.11)
RDW: 14.6 % (ref 11.5–15.5)
WBC: 7.1 10*3/uL (ref 4.0–10.5)

## 2016-01-31 LAB — BASIC METABOLIC PANEL
Anion gap: 4 — ABNORMAL LOW (ref 5–15)
BUN: <5 mg/dL — ABNORMAL LOW (ref 6–20)
CALCIUM: 7.5 mg/dL — AB (ref 8.9–10.3)
CO2: 25 mmol/L (ref 22–32)
CREATININE: 0.39 mg/dL — AB (ref 0.44–1.00)
Chloride: 101 mmol/L (ref 101–111)
GFR calc non Af Amer: >60 mL/min (ref >60)
Glucose, Bld: 87 mg/dL (ref 65–99)
Potassium: 3.3 mmol/L — ABNORMAL LOW (ref 3.5–5.1)
SODIUM: 130 mmol/L — AB (ref 135–145)

## 2016-01-31 LAB — URINE CULTURE: Culture: NO GROWTH

## 2016-01-31 LAB — C-REACTIVE PROTEIN: CRP: 11.4 mg/dL — AB (ref <1.0)

## 2016-01-31 LAB — MAGNESIUM: Magnesium: 1.8 mg/dL (ref 1.7–2.4)

## 2016-01-31 MED ORDER — FLUCONAZOLE 200 MG PO TABS
200.0000 mg | ORAL_TABLET | Freq: Once | ORAL | Status: DC
  Filled 2016-01-31: qty 1

## 2016-01-31 MED ORDER — HEPARIN SODIUM (PORCINE) 5000 UNIT/ML IJ SOLN
5000.0000 [IU] | Freq: Two times a day (BID) | INTRAMUSCULAR | Status: DC
  Administered 2016-01-31 – 2016-02-06 (×9): 5000 [IU] via SUBCUTANEOUS
  Filled 2016-01-31 (×10): qty 1

## 2016-01-31 MED ORDER — METHYLPREDNISOLONE SODIUM SUCC 125 MG IJ SOLR
60.0000 mg | Freq: Every day | INTRAMUSCULAR | Status: DC
  Administered 2016-01-31: 60 mg via INTRAVENOUS
  Filled 2016-01-31: qty 2

## 2016-01-31 NOTE — Progress Notes (Signed)
PT Cancellation Note  Patient Details Name: MELLA INCLAN MRN: [id number] DOB: 1941-02-15   Cancelled Treatment:    Reason Eval/Treat Not Completed: Pain limiting ability to participate (pt reports she's in pain and fatigued from just having showered. Encouraged pt to ambulate with family later today if she is able. Will follow. )   Philomena Doheny 01/31/2016, 2:46 PM [phone]

## 2016-01-31 NOTE — Progress Notes (Signed)
PROGRESS NOTE    Sara Barnes  ERD:[id number] DOB: Apr 30, 1940 DOA: 01/07/2016 PCP: Ann Held, DO   Chief Complaint  Patient presents with  . Rectal Bleeding    SINCE FRIDAY     Brief Narrative:  HPI on 01/07/2016 by Dr. Gilman Schmidt [name] a 75 y.o.femalewith medical history significant of ulcerative colitis versus Crohn's disease, believed to be UC eventually,otherwise healthy, presents to the emergency room with chief complaint of severe abdominal pain for the past several days. She was recently hospitalized and discharged home about 9 days prior to admission. She was here in the hospital with similar complaints, placed on steroids, and tells me today that she improved some and really wanted to go home. At home, she has had ongoing symptoms, never really well achieved full remission, and over the last few days she has felt extremely unwell. She has crampy abdominal pain, nausea, poor appetite, barely able to keep down some Gatorade, and has been having significant amounts of bloody bowel movements as well. She has no fever or chills. She has no chest pain or shortness of breath. She denies any palpitations. She has no lightheadedness or dizziness. She feels generalized weakness. In the emergency room her vital signs are stable, her blood work is essentially unremarkable. Gastroenterology was consulted by EDP and have evaluated patient, and TRH was asked for admission  Interim history Gastroenterology consultation appreciated. Patient underwent flexible sigmoidoscopy. CMV was negative and she was started on Remicade. She was initially doing better and tolerating some diet, GI following and since still having a lot of pain and frequent BMs will be dosed with another Remicaide infusion on  9/17. 9/21 pain and bloody clots worsened overnight. CT abd/pelvis showed perirectal abscess- General surgery Was consulted. Patient underwent I&D on 9/26. Steroids were decreased per  GI recommendations. Patient received dose of amikacin on 01/28/2016. Clinically continuing to improve. Patient is working with physical therapy  Assessment & Plan   Abdominal pain/diarrhea secondary to Ulcerative colitis flare with rectal bleeding/ Perianal abscess -Patient is s/p flexible sigmoidoscopy on 01/08/16, biopsy with findings of active erosive ulcer. No CMV found. Has been given Remicade on 01/09/2016, 01/13/16, and 01/28/2016.Patient is status post I&D of abscess -c diff test negative x2, she was treated with zosynx 1week ,then oral cipro and flagyl, however developed fever on 10/4, abx broadened to imipenem, -.IV Solu-Medrol continued at 15 mg every 12 hours, which will be continued per gastroenterology -repeat blood culture /cxr/ua on 10/4 unremarkable, repeat CT ab/pel on 10/4 Ct shows abscess much improved with no residual fluid collection , drain culture multiple organisms, none predominant -will have general surgery and GI to guide on steroid/abx/remicade use/diverting colostomy  Gastroenterology and surgery input appreciated  Acute blood loss anemia/Symptomatic anemia -Likely secondary to the above, hgb 7.6 on admission -s/p prbc transfusion  On 9/25 and 9/26 hgb has been stable since  Depression/Anxiety -Prozac has been continued and patient is tolerating  Debility -PT/OT has been consulted -Recommendations have been made for skilled nursing facility  Thrombocytopenia -Lovenox was held on 01/26/2016 secondary to thrombocytopenia below 1000 -Had started SCDs for DVT prophylaxis -Platelets have improved 102,000 -Continue Lovenox as tolerated   DVT Prophylaxis   Lovenox  Code Status: Full  Family Communication: Patient in room, family at bedside  Disposition Plan:  anticipate skilled nursing facility eventually  Consultants Gastroenterology  General surgery  Procedures  Flexible sigmoidoscopy Incision and drainage. No abscess on 01/22/2016 prbc  transfusion  Antibiotics   Anti-infectives    Start     Dose/Rate Route Frequency Ordered Stop   01/31/16 1400  fluconazole (DIFLUCAN) tablet 200 mg     200 mg Oral  Once 01/31/16 1204     01/31/16 0000  imipenem-cilastatin (PRIMAXIN) 500 mg in sodium chloride 0.9 % 100 mL IVPB  Status:  Discontinued     500 mg 200 mL/hr over 30 Minutes Intravenous Every 6 hours 01/30/16 1840 01/31/16 0957   01/30/16 1900  imipenem-cilastatin (PRIMAXIN) 500 mg in sodium chloride 0.9 % 100 mL IVPB     500 mg 200 mL/hr over 30 Minutes Intravenous STAT 01/30/16 1840 01/30/16 1956   01/29/16 2200  metroNIDAZOLE (FLAGYL) tablet 500 mg  Status:  Discontinued     500 mg Oral Every 8 hours 01/29/16 1700 01/30/16 1829   01/29/16 2000  ciprofloxacin (CIPRO) tablet 500 mg  Status:  Discontinued     500 mg Oral 2 times daily 01/29/16 1700 01/30/16 1829   01/22/16 0900  piperacillin-tazobactam (ZOSYN) IVPB 3.375 g  Status:  Discontinued     3.375 g 12.5 mL/hr over 240 Minutes Intravenous Every 8 hours 01/22/16 0840 01/29/16 1700   01/21/16 1830  ciprofloxacin (CIPRO) IVPB 400 mg  Status:  Discontinued     400 mg 200 mL/hr over 60 Minutes Intravenous Every 12 hours 01/21/16 1744 01/22/16 0840   01/21/16 1830  metroNIDAZOLE (FLAGYL) IVPB 500 mg  Status:  Discontinued     500 mg 100 mL/hr over 60 Minutes Intravenous Every 6 hours 01/21/16 1744 01/22/16 0840      Subjective:    fever curve trending down, feeling better today  Objective:   Vitals:   01/30/16 1343 01/30/16 2021 01/31/16 0624 01/31/16 0926  BP: 112/69 101/64 134/73 (!) 105/58  Pulse: 100 73 90 97  Resp: 18 16 16 16   Temp: (!) 101.4 F (38.6 C) 98.5 F (36.9 C) 98.8 F (37.1 C) 99.2 F (37.3 C)  TempSrc: Oral Oral Oral Oral  SpO2: 100% 97% 91% 98%  Weight:      Height:        Intake/Output Summary (Last 24 hours) at 01/31/16 1803 Last data filed at 01/31/16 0300  Gross per 24 hour  Intake           564.17 ml  Output                 3 ml  Net           561.17 ml   Filed Weights   01/07/16 1136  Weight: 72.6 kg (160 lb)    Exam  General:  Awake, in no acute distress, hard of hearing  HEENT: Pupils equal round reactive to light, dentition fair  Cardiovascular: Regular rate, S1-S2  Respiratory: Normal chest rise, no crackles   Abdomen: Positive bowel sounds, generally tender  Extremities: No cyanosis, no joint deformities  Neuro: Cranial nerves II through XII grossly intact, sensation intact  Psych: Normal affect, no auditory hallucinations  Skin: No pallor, no rashes   Data Reviewed: I have personally reviewed following labs and imaging studies  CBC:  Recent Labs Lab 01/27/16 0519 01/28/16 0454 01/29/16 0543 01/30/16 0612 01/31/16 0753  WBC 4.8 4.7 8.0 5.8 7.1  NEUTROABS 4.1 3.6 6.6 4.3 5.6  HGB 11.2* 10.2* 11.0* 10.6* 10.4*  HCT 34.4* 31.2* 33.8* 32.4* 31.3*  MCV 90.8 87.6 87.8 90.0 89.2  PLT 100* 101* 120* 133* 993   Basic Metabolic Panel:  Recent  Labs Lab 01/25/16 0607 01/26/16 0542 01/30/16 0612 01/31/16 0753  NA 134* 135 134* 130*  K 4.6 4.2 3.8 3.3*  CL 100* 98* 101 101  CO2 27 30 28 25   GLUCOSE 93 91 76 87  BUN 10 8 7  <5*  CREATININE 0.46 0.44 0.51 0.39*  CALCIUM 8.1* 8.0* 8.1* 7.5*  MG  --   --   --  1.8   GFR: Estimated Creatinine Clearance: 62.9 mL/min (by C-G formula based on SCr of 0.39 mg/dL (L)). Liver Function Tests:  Recent Labs Lab 01/25/16 0607 01/26/16 0542  AST 15 22  ALT 15 21  ALKPHOS 57 62  BILITOT 0.4 0.4  PROT 4.8* 5.0*  ALBUMIN 1.8* 2.0*   No results for input(s): LIPASE, AMYLASE in the last 168 hours. No results for input(s): AMMONIA in the last 168 hours. Coagulation Profile: No results for input(s): INR, PROTIME in the last 168 hours. Cardiac Enzymes: No results for input(s): CKTOTAL, CKMB, CKMBINDEX, TROPONINI in the last 168 hours. BNP (last 3 results) No results for input(s): PROBNP in the last 8760 hours. HbA1C: No results for  input(s): HGBA1C in the last 72 hours. CBG: No results for input(s): GLUCAP in the last 168 hours. Lipid Profile: No results for input(s): CHOL, HDL, LDLCALC, TRIG, CHOLHDL, LDLDIRECT in the last 72 hours. Thyroid Function Tests: No results for input(s): TSH, T4TOTAL, FREET4, T3FREE, THYROIDAB in the last 72 hours. Anemia Panel: No results for input(s): VITAMINB12, FOLATE, FERRITIN, TIBC, IRON, RETICCTPCT in the last 72 hours. Urine analysis:    Component Value Date/Time   COLORURINE YELLOW 01/30/2016 [place] 01/30/2016 1653   LABSPEC 1.008 01/30/2016 1653   PHURINE 6.5 01/30/2016 1653   GLUCOSEU NEGATIVE 01/30/2016 1653   HGBUR MODERATE (A) 01/30/2016 1653   HGBUR small 12/13/2009 0904   BILIRUBINUR NEGATIVE 01/30/2016 1653   BILIRUBINUR ne 01/30/2015 1543   KETONESUR NEGATIVE 01/30/2016 1653   PROTEINUR NEGATIVE 01/30/2016 1653   UROBILINOGEN 0.2 01/30/2015 1543   UROBILINOGEN 0.2 09/25/2013 1350   NITRITE NEGATIVE 01/30/2016 1653   LEUKOCYTESUR TRACE (A) 01/30/2016 1653   Sepsis Labs: @LABRCNTIP (procalcitonin:4,lacticidven:4)  ) Recent Results (from the past 240 hour(s))  MRSA PCR Screening     Status: None   Collection Time: 01/22/16 10:55 AM  Result Value Ref Range Status   MRSA by PCR NEGATIVE NEGATIVE Final    Comment:        The GeneXpert MRSA Assay (FDA approved for NASAL specimens only), is one component of a comprehensive MRSA colonization surveillance program. It is not intended to diagnose MRSA infection nor to guide or monitor treatment for MRSA infections.   Aerobic/Anaerobic Culture (surgical/deep wound)     Status: None   Collection Time: 01/22/16  1:17 PM  Result Value Ref Range Status   Specimen Description ABSCESS PERIRECTAL  Final   Special Requests NONE  Final   Gram Stain   Final    FEW WBC PRESENT,BOTH PMN AND MONONUCLEAR MANY GRAM POSITIVE COCCI IN PAIRS AND CHAINS MANY GRAM NEGATIVE RODS MANY GRAM POSITIVE  RODS MODERATE GRAM NEGATIVE COCCOBACILLI RARE YEAST Performed at [facility]    Culture MULTIPLE ORGANISMS PRESENT, NONE PREDOMINANT  Final   Report Status 01/29/2016 FINAL  Final  Culture, blood (routine x 2)     Status: None (Preliminary result)   Collection Time: 01/30/16  2:47 PM  Result Value Ref Range Status   Specimen Description BLOOD BLOOD RIGHT ARM  Final   Special  Requests BOTTLES DRAWN AEROBIC AND ANAEROBIC 5 CC  Final   Culture   Final    NO GROWTH < 24 HOURS Performed at [facility]    Report Status PENDING  Incomplete  Culture, blood (routine x 2)     Status: None (Preliminary result)   Collection Time: 01/30/16  2:47 PM  Result Value Ref Range Status   Specimen Description BLOOD BLOOD RIGHT ARM  Final   Special Requests BOTTLES DRAWN AEROBIC AND ANAEROBIC 5 CC  Final   Culture   Final    NO GROWTH < 24 HOURS Performed at [facility]    Report Status PENDING  Incomplete  Urine culture     Status: None   Collection Time: 01/30/16  4:53 PM  Result Value Ref Range Status   Specimen Description URINE, RANDOM  Final   Special Requests NONE  Final   Culture NO GROWTH Performed at [facility]   Final   Report Status 01/31/2016 FINAL  Final      Radiology Studies: Ct Pelvis W Contrast  Result Date: 01/30/2016 CLINICAL DATA:  Follow-up perirectal abscess EXAM: CT PELVIS WITH CONTRAST TECHNIQUE: Multidetector CT imaging of the pelvis was performed using the standard protocol following the bolus administration of intravenous contrast. CONTRAST:  153m ISOVUE-300 IOPAMIDOL (ISOVUE-300) INJECTION 61% COMPARISON:  01/21/2016 FINDINGS: Urinary Tract: The bladder is well distended. No filling defects are noted. Bowel: The appendix is not well visualized and may have been surgically removed. No obstructive changes are seen. Vascular/Lymphatic: No pathologically enlarged lymph nodes. No significant vascular abnormality seen. Reproductive: The  uterus has been surgically removed. No pelvic mass lesion is seen. Other: There are changes consistent with the known history of perirectal abscess which has been debrided in the interval from the prior exam. A surgical drain is noted in place. Scattered air related to the recent surgery is seen. No demonstrable residual fluid collection is noted. The degree of air within the pelvis has nearly completely resolved when compared with the prior study. No new abscess is seen. Musculoskeletal: Within normal limits. IMPRESSION: Status post debridement of a perirectal abscess. There is been significant improvement with no significant residual fluid collection identified. Surgical drain is noted in place. Electronically Signed   By: MInez CatalinaM.D.   On: 01/30/2016 21:53   Dg Chest Port 1 View  Result Date: 01/30/2016 CLINICAL DATA:  Fever. severe Crohns colitis with deep rectal ulcers (no improvement at time of last flex 9/12), and development of large complex perirectal abscess while on IV steroids -s/p I/D 9/26 Drain remains in Her pain is not improving , seems to be worse last couple daysCopious drainage of stool/blood from rectum Hx hx left breast cancer with lumpectomy 2016 EXAM: PORTABLE CHEST - 1 VIEW COMPARISON:  01/07/2016 FINDINGS: Lungs are clear. Heart size and mediastinal contours are within normal limits. No effusion. Postop changes left breast.  Regional bones unremarkable. IMPRESSION: No acute cardiopulmonary disease. Electronically Signed   By: DLucrezia EuropeM.D.   On: 01/30/2016 14:18     Scheduled Meds: . dicyclomine  10 mg Oral TID AC & HS  . fluconazole  200 mg Oral Once  . FLUoxetine  40 mg Oral Daily  . heparin subcutaneous  5,000 Units Subcutaneous Q12H  . methylPREDNISolone (SOLU-MEDROL) injection  60 mg Intravenous Daily  . midazolam  2 mg Intravenous Once  . oxymorphone  10 mg Oral QAC breakfast  . oxymorphone  10 mg Oral QHS  Continuous Infusions: . sodium chloride 100 mL/hr  at 01/31/16 1043     LOS: 23 days    Needham Biggins  MD. PhD on 01/31/2016 at 6:03 PM  Between 7am to 7pm - Pager - [phone]  After 7pm go to [email or web address] - password [email or web address]  And look for the night coverage person covering for me after hours  [facility] Office  [phone]

## 2016-01-31 NOTE — Progress Notes (Signed)
LCSWA discussed with patient her disposition once medically stable. She has accepted bed offer at [facility]. SNF informed.

## 2016-01-31 NOTE — Progress Notes (Signed)
[facility] Progress Note  9 Days Post-Op  Subjective: Pt sister in room. Still having fevers and pain.    Objective: Vital signs in last 24 hours: Temp:  [98.5 F (36.9 C)-99.2 F (37.3 C)] 99.2 F (37.3 C) (10/05 0926) Pulse Rate:  [73-97] 97 (10/05 0926) Resp:  [16] 16 (10/05 0926) BP: (101-134)/(58-73) 105/58 (10/05 0926) SpO2:  [91 %-98 %] 98 % (10/05 0926) Last BM Date: 01/30/16  Intake/Output from previous day: 10/04 0701 - 10/05 0700 In: 1340 [P.O.:240; I.V.:1000; IV Piggyback:100] Out: 303 [Urine:303] Intake/Output this shift: No intake/output data recorded.  PE: Gen:  Alert, NAD, pleasant Perineum: significant bloody drainage lining mesh underwear, penrose intact and draining appropriately.   Lab Results:   Recent Labs  01/30/16 0612 01/31/16 0753  WBC 5.8 7.1  HGB 10.6* 10.4*  HCT 32.4* 31.3*  PLT 133* 155   BMET  Recent Labs  01/30/16 0612 01/31/16 0753  NA 134* 130*  K 3.8 3.3*  CL 101 101  CO2 28 25  GLUCOSE 76 87  BUN 7 <5*  CREATININE 0.51 0.39*  CALCIUM 8.1* 7.5*   C Anti-infectives: Anti-infectives    Start     Dose/Rate Route Frequency Ordered Stop   01/31/16 1400  fluconazole (DIFLUCAN) tablet 200 mg     200 mg Oral  Once 01/31/16 1204     01/31/16 0000  imipenem-cilastatin (PRIMAXIN) 500 mg in sodium chloride 0.9 % 100 mL IVPB  Status:  Discontinued     500 mg 200 mL/hr over 30 Minutes Intravenous Every 6 hours 01/30/16 1840 01/31/16 0957   01/30/16 1900  imipenem-cilastatin (PRIMAXIN) 500 mg in sodium chloride 0.9 % 100 mL IVPB     500 mg 200 mL/hr over 30 Minutes Intravenous STAT 01/30/16 1840 01/30/16 1956   01/29/16 2200  metroNIDAZOLE (FLAGYL) tablet 500 mg  Status:  Discontinued     500 mg Oral Every 8 hours 01/29/16 1700 01/30/16 1829   01/29/16 2000  ciprofloxacin (CIPRO) tablet 500 mg  Status:  Discontinued     500 mg Oral 2 times daily 01/29/16 1700 01/30/16 1829   01/22/16 0900   piperacillin-tazobactam (ZOSYN) IVPB 3.375 g  Status:  Discontinued     3.375 g 12.5 mL/hr over 240 Minutes Intravenous Every 8 hours 01/22/16 0840 01/29/16 1700   01/21/16 1830  ciprofloxacin (CIPRO) IVPB 400 mg  Status:  Discontinued     400 mg 200 mL/hr over 60 Minutes Intravenous Every 12 hours 01/21/16 1744 01/22/16 0840   01/21/16 1830  metroNIDAZOLE (FLAGYL) IVPB 500 mg  Status:  Discontinued     500 mg 100 mL/hr over 60 Minutes Intravenous Every 6 hours 01/21/16 1744 01/22/16 0840     Assessment/Plan POD#7 S/p incision, drainage, and packing of complex perirectal abscess - packing removed POD#1, penrose drain in place to allow for continued drainage and control of possible fistulae - may d/c IV abx from surgical standpoint, WBC WNL (5.8) - Remecade, steroids per GI - TID sitz baths, showers for wound care (mild soap/warm water) - encourage mobilization with PT  - discussed a diverting colostomy with the patient as an option for promoting wound healing and decreasing pain/contamination. Patient wishes to hold off on surgical intervention at this time.   LOS: 23 days    Rosario Adie, MD  [facility]

## 2016-01-31 NOTE — Progress Notes (Signed)
Occupational Therapy Treatment Patient Details Name: Sara Barnes MRN: [id number] DOB: 1941/04/23 Today's Date: 01/31/2016    History of present illness 75 y.o. female admitted with abdominal pain, bloody BMs. Dx of ulcerative colitis. Pt had recent hospitalization with similar symptoms, DCed home 12/29/15. PMH of breast cancer.    OT comments  Pt feeling weak this OT session  Follow Up Recommendations  SNF;Supervision/Assistance - 24 hour    Equipment Recommendations  None recommended by OT (defer to next venue)       Precautions / Restrictions Precautions Precautions: Fall Restrictions Weight Bearing Restrictions: No       Mobility Bed Mobility Overal bed mobility: Needs Assistance Bed Mobility: Sit to Supine       Sit to supine: Min assist   General bed mobility comments: assisted LEs  Transfers Overall transfer level: Needs assistance Equipment used: 1 person hand held assist Transfers: Sit to/from [facility] Sit to Stand: Min assist Stand pivot transfers: Min assist       General transfer comment: VCs hand placement, Min A to rise and steady    Balance                                   ADL                       Lower Body Dressing: Sit to/from stand;Maximal assistance;Cueing for compensatory techniques;Cueing for sequencing   Toilet Transfer: Minimal assistance;Ambulation;BSC   Toileting- Clothing Manipulation and Hygiene: Sit to/from stand;Cueing for sequencing;Cueing for compensatory techniques;Maximal assistance (max A needed as pt had a very loose BM )         General ADL Comments: Pt not feeling well this day. Pt needed significant a getting cleaned up after very loose BM.  MD present for part of OT session         Perception     Praxis      Cognition   Behavior During Therapy: [facility] for tasks assessed/performed Overall Cognitive Status: Within Functional Limits for tasks assessed                                General Comments      Pertinent Vitals/ Pain       Faces Pain Scale: Hurts whole lot Pain Location: rectum Pain Descriptors / Indicators: Sore Pain Intervention(s): Repositioned;Monitored during session;Limited activity within patient's tolerance;Patient requesting pain meds-RN notified;RN gave pain meds during session  Home Living                                          Prior Functioning/Environment              Frequency  Min 2X/week        Progress Toward Goals  OT Goals(current goals can now be found in the care plan section)  Progress towards OT goals: Progressing toward goals     Plan Discharge plan remains appropriate    Co-evaluation                 End of Session Equipment Utilized During Treatment: Rolling walker;Gait belt   Activity Tolerance Patient limited by fatigue;Patient limited by pain   Patient Left in bed;with call bell/phone within  reach;with bed alarm set;with family/visitor present   Nurse Communication Mobility status (pt wants sign on door to limit visitors)        Time: 3779-3968 OT Time Calculation (min): 14 min  Charges: OT General Charges $OT Visit: 1 Procedure OT Treatments $Self Care/Home Management : 8-22 mins  Torren Maffeo, Thereasa Parkin 01/31/2016, 12:54 PM

## 2016-01-31 NOTE — Progress Notes (Signed)
Patient ID: Sara Barnes, female   DOB: [birth year], 75 y.o.   MRN: [id number]    Progress Note   Subjective    Feels terrible, nauseated , extreme weakness. She spiked one temp yesterday to 102 -has not recurred ,99.8 this am. Rectal pain about the same - remains severe, incontinent of stool   Objective   Vital signs in last 24 hours: Temp:  [98.5 F (36.9 C)-102.4 F (39.1 C)] 99.2 F (37.3 C) (10/05 0926) Pulse Rate:  [73-100] 97 (10/05 0926) Resp:  [16-18] 16 (10/05 0926) BP: (101-134)/(58-73) 105/58 (10/05 0926) SpO2:  [91 %-100 %] 98 % (10/05 0926) Last BM Date: 01/30/16 General:   Older  white female in , ill appearing, slightly flushed, laying very still in bed Heart:  Regular rate and rhythm; no murmurs Lungs: Respirations even and unlabored, lungs CTA bilaterally Abdomen:  Soft, nontender and nondistended. Normal bowel sounds. Extremities:  Without edema. Neurologic:  Alert and oriented,  grossly normal neurologically. Psych:  Cooperative. Normal mood and affect.  Intake/Output from previous day: 10/04 0701 - 10/05 0700 In: 1572 [P.O.:240; I.V.:1000; IV Piggyback:100] Out: 303 [Urine:303] Intake/Output this shift: No intake/output data recorded.  Lab Results:  Recent Labs  01/29/16 0543 01/30/16 0612 01/31/16 0753  WBC 8.0 5.8 7.1  HGB 11.0* 10.6* 10.4*  HCT 33.8* 32.4* 31.3*  PLT 120* 133* 155   BMET  Recent Labs  01/30/16 0612 01/31/16 0753  NA 134* 130*  K 3.8 3.3*  CL 101 101  CO2 28 25  GLUCOSE 76 87  BUN 7 <5*  CREATININE 0.51 0.39*  CALCIUM 8.1* 7.5*   LFT No results for input(s): PROT, ALBUMIN, AST, ALT, ALKPHOS, BILITOT, BILIDIR, IBILI in the last 72 hours. PT/INR No results for input(s): LABPROT, INR in the last 72 hours.  Studies/Results: Ct Pelvis W Contrast  Result Date: 01/30/2016 CLINICAL DATA:  Follow-up perirectal abscess EXAM: CT PELVIS WITH CONTRAST TECHNIQUE: Multidetector CT imaging of the pelvis was performed using  the standard protocol following the bolus administration of intravenous contrast. CONTRAST:  124m ISOVUE-300 IOPAMIDOL (ISOVUE-300) INJECTION 61% COMPARISON:  01/21/2016 FINDINGS: Urinary Tract: The bladder is well distended. No filling defects are noted. Bowel: The appendix is not well visualized and may have been surgically removed. No obstructive changes are seen. Vascular/Lymphatic: No pathologically enlarged lymph nodes. No significant vascular abnormality seen. Reproductive: The uterus has been surgically removed. No pelvic mass lesion is seen. Other: There are changes consistent with the known history of perirectal abscess which has been debrided in the interval from the prior exam. A surgical drain is noted in place. Scattered air related to the recent surgery is seen. No demonstrable residual fluid collection is noted. The degree of air within the pelvis has nearly completely resolved when compared with the prior study. No new abscess is seen. Musculoskeletal: Within normal limits. IMPRESSION: Status post debridement of a perirectal abscess. There is been significant improvement with no significant residual fluid collection identified. Surgical drain is noted in place. Electronically Signed   By: MInez CatalinaM.D.   On: 01/30/2016 21:53   Dg Chest Port 1 View  Result Date: 01/30/2016 CLINICAL DATA:  Fever. severe Crohns colitis with deep rectal ulcers (no improvement at time of last flex 9/12), and development of large complex perirectal abscess while on IV steroids -s/p I/D 9/26 Drain remains in Her pain is not improving , seems to be worse last couple daysCopious drainage of stool/blood from rectum Hx hx left breast  cancer with lumpectomy 2016 EXAM: PORTABLE CHEST - 1 VIEW COMPARISON:  01/07/2016 FINDINGS: Lungs are clear. Heart size and mediastinal contours are within normal limits. No effusion. Postop changes left breast.  Regional bones unremarkable. IMPRESSION: No acute cardiopulmonary disease.  Electronically Signed   By: Lucrezia Europe M.D.   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  While on IV steroids-  She is s/p I/D of abscess 9/26 - drain remains in - Ct pelvis done yesterday due to increased c/o pain, fever and copious drainage Ct shows abscess much improved with no residual fluid collection  BC done yesterday and started  empirically on Primaxin per medicine service  Today pt with extreme weakness, nausea, flushed -- more concerning for serum sickness reaction to Remicade and or component of adrenal insufficiency due  to steroid withdrawal   #2 DVT prophylaxis - had stopped Lovenox yesterday because of bruising and increased rectal bleeding - will start sq heparin today  Will await BC D/C primaxin   Will give 60 mg IV solumedrol now , continue daily x 48 hours,then decrease dose and taper steroids slowly We have begun discussions with pt regarding diverting colostomy which should help with pain from rectal disease  and improve quality of life , as may take months for rectal disease to heal. 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

## 2016-02-01 LAB — BASIC METABOLIC PANEL
Anion gap: 5 (ref 5–15)
BUN: <5 mg/dL — ABNORMAL LOW (ref 6–20)
CALCIUM: 7.7 mg/dL — AB (ref 8.9–10.3)
CO2: 25 mmol/L (ref 22–32)
CREATININE: 0.4 mg/dL — AB (ref 0.44–1.00)
Chloride: 105 mmol/L (ref 101–111)
GLUCOSE: 98 mg/dL (ref 65–99)
Potassium: 3.2 mmol/L — ABNORMAL LOW (ref 3.5–5.1)
Sodium: 135 mmol/L (ref 135–145)

## 2016-02-01 LAB — CBC WITH DIFFERENTIAL/PLATELET
BASOS ABS: 0 10*3/uL (ref 0.0–0.1)
BASOS PCT: 0 %
EOS PCT: 0 %
Eosinophils Absolute: 0 10*3/uL (ref 0.0–0.7)
HEMATOCRIT: 28 % — AB (ref 36.0–46.0)
Hemoglobin: 9.3 g/dL — ABNORMAL LOW (ref 12.0–15.0)
Lymphocytes Relative: 29 %
Lymphs Abs: 1.2 10*3/uL (ref 0.7–4.0)
MCH: 29.7 pg (ref 26.0–34.0)
MCHC: 33.2 g/dL (ref 30.0–36.0)
MCV: 89.5 fL (ref 78.0–100.0)
MONO ABS: 0.3 10*3/uL (ref 0.1–1.0)
MONOS PCT: 8 %
NEUTROS ABS: 2.7 10*3/uL (ref 1.7–7.7)
Neutrophils Relative %: 63 %
PLATELETS: 150 10*3/uL (ref 150–400)
RBC: 3.13 MIL/uL — ABNORMAL LOW (ref 3.87–5.11)
RDW: 14.6 % (ref 11.5–15.5)
WBC: 4.2 10*3/uL (ref 4.0–10.5)

## 2016-02-01 LAB — MAGNESIUM: Magnesium: 1.8 mg/dL (ref 1.7–2.4)

## 2016-02-01 LAB — C-REACTIVE PROTEIN: CRP: 16 mg/dL — AB (ref <1.0)

## 2016-02-01 MED ORDER — METHYLPREDNISOLONE SODIUM SUCC 40 MG IJ SOLR
40.0000 mg | Freq: Every day | INTRAMUSCULAR | Status: DC
  Administered 2016-02-01 – 2016-02-08 (×8): 40 mg via INTRAVENOUS
  Filled 2016-02-01 (×8): qty 1

## 2016-02-01 MED ORDER — CYANOCOBALAMIN 1000 MCG/ML IJ SOLN
1000.0000 ug | Freq: Once | INTRAMUSCULAR | Status: AC
  Administered 2016-02-01: 1000 ug via INTRAMUSCULAR
  Filled 2016-02-01: qty 1

## 2016-02-01 MED ORDER — POTASSIUM CHLORIDE CRYS ER 20 MEQ PO TBCR
40.0000 meq | EXTENDED_RELEASE_TABLET | Freq: Once | ORAL | Status: AC
  Administered 2016-02-01: 40 meq via ORAL
  Filled 2016-02-01: qty 2

## 2016-02-01 NOTE — Progress Notes (Signed)
Patient ID: Sara Barnes, female   DOB: [birth year], 75 y.o.   MRN: [id number]    Progress Note   Subjective   Looks much better today- says nausea gone, and energy level improved -was able to get up and walk yesterday afternoon Pain still severe - did sleep last night and woke up in pain T max 99 range BC neg so far, urine culture neg, CXR neg Antibiotics have been stopped Now on SQ heparin  CRp went up yesterday 11.4- pending today  hgb down to 9.3   With increased bleeding from wound and stool   Objective   Vital signs in last 24 hours: Temp:  [98.3 F (36.8 C)-99.2 F (37.3 C)] 98.3 F (36.8 C) (10/05 2159) Pulse Rate:  [73-97] 73 (10/05 2159) Resp:  [16] 16 (10/05 2159) BP: (104-105)/(58-61) 104/61 (10/05 2159) SpO2:  [98 %] 98 % (10/05 2159) Last BM Date: 01/30/16 General:   Older  white female in NAD- I did not examine wound this am Heart:  Regular rate and rhythm; no murmurs Lungs: Respirations even and unlabored, lungs CTA bilaterally Abdomen:  Soft, nontender and nondistended. Normal bowel sounds. Extremities:  Without edema. Neurologic:  Alert and oriented,  grossly normal neurologically. Psych:  Cooperative. Normal mood and affect.  Intake/Output from previous day: 10/05 0701 - 10/06 0700 In: 2514.2 [P.O.:200; I.V.:2314.2] Out: -  Intake/Output this shift: No intake/output data recorded.  Lab Results:  Recent Labs  01/30/16 0612 01/31/16 0753 02/01/16 0553  WBC 5.8 7.1 4.2  HGB 10.6* 10.4* 9.3*  HCT 32.4* 31.3* 28.0*  PLT 133* 155 150   BMET  Recent Labs  01/30/16 0612 01/31/16 0753 02/01/16 0553  NA 134* 130* 135  K 3.8 3.3* 3.2*  CL 101 101 105  CO2 28 25 25   GLUCOSE 76 87 98  BUN 7 <5* <5*  CREATININE 0.51 0.39* 0.40*  CALCIUM 8.1* 7.5* 7.7*   LFT No results for input(s): PROT, ALBUMIN, AST, ALT, ALKPHOS, BILITOT, BILIDIR, IBILI in the last 72 hours. PT/INR No results for input(s): LABPROT, INR in the last 72  hours.  Studies/Results: Ct Pelvis W Contrast  Result Date: 01/30/2016 CLINICAL DATA:  Follow-up perirectal abscess EXAM: CT PELVIS WITH CONTRAST TECHNIQUE: Multidetector CT imaging of the pelvis was performed using the standard protocol following the bolus administration of intravenous contrast. CONTRAST:  167m ISOVUE-300 IOPAMIDOL (ISOVUE-300) INJECTION 61% COMPARISON:  01/21/2016 FINDINGS: Urinary Tract: The bladder is well distended. No filling defects are noted. Bowel: The appendix is not well visualized and may have been surgically removed. No obstructive changes are seen. Vascular/Lymphatic: No pathologically enlarged lymph nodes. No significant vascular abnormality seen. Reproductive: The uterus has been surgically removed. No pelvic mass lesion is seen. Other: There are changes consistent with the known history of perirectal abscess which has been debrided in the interval from the prior exam. A surgical drain is noted in place. Scattered air related to the recent surgery is seen. No demonstrable residual fluid collection is noted. The degree of air within the pelvis has nearly completely resolved when compared with the prior study. No new abscess is seen. Musculoskeletal: Within normal limits. IMPRESSION: Status post debridement of a perirectal abscess. There is been significant improvement with no significant residual fluid collection identified. Surgical drain is noted in place. Electronically Signed   By: MInez CatalinaM.D.   On: 01/30/2016 21:53   Dg Chest Port 1 View  Result Date: 01/30/2016 CLINICAL DATA:  Fever. severe Crohns colitis with  deep rectal ulcers (no improvement at time of last flex 9/12), and development of large complex perirectal abscess while on IV steroids -s/p I/D 9/26 Drain remains in Her pain is not improving , seems to be worse last couple daysCopious drainage of stool/blood from rectum Hx hx left breast cancer with lumpectomy 2016 EXAM: PORTABLE CHEST - 1 VIEW  COMPARISON:  01/07/2016 FINDINGS: Lungs are clear. Heart size and mediastinal contours are within normal limits. No effusion. Postop changes left breast.  Regional bones unremarkable. IMPRESSION: No acute cardiopulmonary disease. Electronically Signed   By: Lucrezia Europe M.D.   On: 01/30/2016 14:18       Assessment / Plan:    #1 75 yo female with severe Crohns colitis with severe rectal  involvement and development  of perirectal abscess and fistula while on IV steroids. S/P I/D  10 days ago- CT 48 hours ago showed abscess resolved -drain remains  In  Day # 4 post Remicade  She continues to have severe rectal pain -requiring Opana and IV Dilaudid for pain control   CRP had come down significantly with Remicade but unclear to me that her  deep rectal ulcerations have improved - will need repeat Flex at some point next few days to re -assess  Febrile episode post Remicade and severe weakness /lethary and nausea-   Also had increase in CRP - this may be serum sickness type reaction to Remicade, no rash - improved with IV solumedrol She had also been withdrawn from steroids and may have had sxs from adrenal insufficiency but would not explain fever and increase in CRP. Much better today  Will continue Solumedrol - 40 mg today then will taper down very slowly  #2 progressive anemia - secondary to bleeding from wound /stool -in setting of anticoagulation - follow hgb carefully Now on SQ heparin on place of Lovenox.  Have begun discussions regarding diverting probable ileostomy - she is not ready yet ,but is thinking about it- offered transfer to [facility] or [facility]  For surgery if they wish.   Active Problems:   Ulcerative pancolitis with rectal bleeding (HCC)   Abdominal pain   GI bleed   Left upper quadrant pain   Absolute anemia   Indeterminate colitis   Acute blood loss anemia   Difficulty in walking, not elsewhere classified   Other depression due to general medical condition   Crohn's disease  of large intestine with abscess ([place])   Fever     LOS: 24 days   Sara Barnes  02/01/2016, 8:50 AM

## 2016-02-01 NOTE — Progress Notes (Signed)
PROGRESS NOTE    Sara Barnes  LFY:[id number] DOB: February 17, 1941 DOA: 01/07/2016 PCP: Ann Held, DO   Chief Complaint  Patient presents with  . Rectal Bleeding    SINCE FRIDAY     Brief Narrative:  HPI on 01/07/2016 by Dr. Gilman Schmidt [name] a 75 y.o.femalewith medical history significant of ulcerative colitis versus Crohn's disease, believed to be UC eventually,otherwise healthy, presents to the emergency room with chief complaint of severe abdominal pain for the past several days. She was recently hospitalized and discharged home about 9 days prior to admission. She was here in the hospital with similar complaints, placed on steroids, and tells me today that she improved some and really wanted to go home. At home, she has had ongoing symptoms, never really well achieved full remission, and over the last few days she has felt extremely unwell. She has crampy abdominal pain, nausea, poor appetite, barely able to keep down some Gatorade, and has been having significant amounts of bloody bowel movements as well. She has no fever or chills. She has no chest pain or shortness of breath. She denies any palpitations. She has no lightheadedness or dizziness. She feels generalized weakness. In the emergency room her vital signs are stable, her blood work is essentially unremarkable. Gastroenterology was consulted by EDP and have evaluated patient, and TRH was asked for admission  Interim history Gastroenterology consultation appreciated. Patient underwent flexible sigmoidoscopy. CMV was negative and she was started on Remicade. She was initially doing better and tolerating some diet, GI following and since still having a lot of pain and frequent BMs will be dosed with another Remicaide infusion on  9/17. 9/21 pain and bloody clots worsened overnight. CT abd/pelvis showed perirectal abscess- General surgery Was consulted. Patient underwent I&D on 9/26. Steroids were decreased per  GI recommendations. Patient received dose of amikacin on 01/28/2016. Clinically continuing to improve. Patient is working with physical therapy  Assessment & Plan   Abdominal pain/diarrhea secondary to Ulcerative colitis flare with rectal bleeding/ Perianal abscess -Patient is s/p flexible sigmoidoscopy on 01/08/16, biopsy with findings of active erosive ulcer. No CMV found. Has been given Remicade on 01/09/2016, 01/13/16, and 01/28/2016.Patient is status post I&D of abscess -c diff test negative x2, she was treated with zosynx 1week ,then oral cipro and flagyl, however developed fever on 10/4, abx broadened to imipenem, -.IV Solu-Medrol continued at 15 mg every 12 hours, which will be continued per gastroenterology -repeat blood culture /cxr/ua on 10/4 unremarkable, repeat CT ab/pel on 10/4 Ct shows abscess much improved with no residual fluid collection , drain culture multiple organisms, none predominant -will have general surgery and GI to guide on steroid/abx/remicade use/diverting colostomy  Gastroenterology and surgery input appreciated  Acute blood loss anemia/Symptomatic anemia -Likely secondary to the above, hgb 7.6 on admission -s/p prbc transfusion  On 9/25 and 9/26 hgb has been stable since  Depression/Anxiety -Prozac has been continued and patient is tolerating  Debility -PT/OT has been consulted -Recommendations have been made for skilled nursing facility  Thrombocytopenia -Lovenox was held on 01/26/2016 secondary to thrombocytopenia below 1000 -Had started SCDs for DVT prophylaxis -Platelets have improved 102,000 -Continue Lovenox as tolerated    Hypokalemia: replace k  b12 deficiency: b12 Im injection x1 on 10/6.  DVT Prophylaxis   Lovenox  Code Status: Full  Family Communication: Patient in room, family at bedside  Disposition Plan:  anticipate skilled nursing facility eventually  Consultants Gastroenterology  General surgery  Procedures  Flexible  sigmoidoscopy Incision and drainage. No abscess on 01/22/2016 prbc transfusion   Antibiotics   Anti-infectives    Start     Dose/Rate Route Frequency Ordered Stop   01/31/16 1400  fluconazole (DIFLUCAN) tablet 200 mg     200 mg Oral  Once 01/31/16 1204     01/31/16 0000  imipenem-cilastatin (PRIMAXIN) 500 mg in sodium chloride 0.9 % 100 mL IVPB  Status:  Discontinued     500 mg 200 mL/hr over 30 Minutes Intravenous Every 6 hours 01/30/16 1840 01/31/16 0957   01/30/16 1900  imipenem-cilastatin (PRIMAXIN) 500 mg in sodium chloride 0.9 % 100 mL IVPB     500 mg 200 mL/hr over 30 Minutes Intravenous STAT 01/30/16 1840 01/30/16 1956   01/29/16 2200  metroNIDAZOLE (FLAGYL) tablet 500 mg  Status:  Discontinued     500 mg Oral Every 8 hours 01/29/16 1700 01/30/16 1829   01/29/16 2000  ciprofloxacin (CIPRO) tablet 500 mg  Status:  Discontinued     500 mg Oral 2 times daily 01/29/16 1700 01/30/16 1829   01/22/16 0900  piperacillin-tazobactam (ZOSYN) IVPB 3.375 g  Status:  Discontinued     3.375 g 12.5 mL/hr over 240 Minutes Intravenous Every 8 hours 01/22/16 0840 01/29/16 1700   01/21/16 1830  ciprofloxacin (CIPRO) IVPB 400 mg  Status:  Discontinued     400 mg 200 mL/hr over 60 Minutes Intravenous Every 12 hours 01/21/16 1744 01/22/16 0840   01/21/16 1830  metroNIDAZOLE (FLAGYL) IVPB 500 mg  Status:  Discontinued     500 mg 100 mL/hr over 60 Minutes Intravenous Every 6 hours 01/21/16 1744 01/22/16 0840      Subjective:    patient is seen with family member in room, she is feeling better, no fever, she is requesting her monthly b12 shot.  Objective:   Vitals:   01/30/16 2021 01/31/16 0624 01/31/16 0926 01/31/16 2159  BP: 101/64 134/73 (!) 105/58 104/61  Pulse: 73 90 97 73  Resp: 16 16 16 16   Temp: 98.5 F (36.9 C) 98.8 F (37.1 C) 99.2 F (37.3 C) 98.3 F (36.8 C)  TempSrc: Oral Oral Oral Oral  SpO2: 97% 91% 98% 98%  Weight:      Height:        Intake/Output Summary (Last 24  hours) at 02/01/16 0844 Last data filed at 02/01/16 0600  Gross per 24 hour  Intake          2514.17 ml  Output                0 ml  Net          2514.17 ml   Filed Weights   01/07/16 1136  Weight: 72.6 kg (160 lb)    Exam  General:  Awake, in no acute distress, hard of hearing  HEENT: Pupils equal round reactive to light, dentition fair  Cardiovascular: Regular rate, S1-S2  Respiratory: Normal chest rise, no crackles   Abdomen: Positive bowel sounds, generally tender  Extremities: No cyanosis, no joint deformities  Neuro: Cranial nerves II through XII grossly intact, sensation intact  Psych: Normal affect, no auditory hallucinations  Skin: No pallor, no rashes   Data Reviewed: I have personally reviewed following labs and imaging studies  CBC:  Recent Labs Lab 01/28/16 0454 01/29/16 0543 01/30/16 0612 01/31/16 0753 02/01/16 0553  WBC 4.7 8.0 5.8 7.1 4.2  NEUTROABS 3.6 6.6 4.3 5.6 2.7  HGB 10.2* 11.0* 10.6* 10.4* 9.3*  HCT 31.2*  33.8* 32.4* 31.3* 28.0*  MCV 87.6 87.8 90.0 89.2 89.5  PLT 101* 120* 133* 155 831   Basic Metabolic Panel:  Recent Labs Lab 01/26/16 0542 01/30/16 0612 01/31/16 0753 02/01/16 0553  NA 135 134* 130* 135  K 4.2 3.8 3.3* 3.2*  CL 98* 101 101 105  CO2 30 28 25 25   GLUCOSE 91 76 87 98  BUN 8 7 <5* <5*  CREATININE 0.44 0.51 0.39* 0.40*  CALCIUM 8.0* 8.1* 7.5* 7.7*  MG  --   --  1.8 1.8   GFR: Estimated Creatinine Clearance: 62.9 mL/min (by C-G formula based on SCr of 0.4 mg/dL (L)). Liver Function Tests:  Recent Labs Lab 01/26/16 0542  AST 22  ALT 21  ALKPHOS 62  BILITOT 0.4  PROT 5.0*  ALBUMIN 2.0*   No results for input(s): LIPASE, AMYLASE in the last 168 hours. No results for input(s): AMMONIA in the last 168 hours. Coagulation Profile: No results for input(s): INR, PROTIME in the last 168 hours. Cardiac Enzymes: No results for input(s): CKTOTAL, CKMB, CKMBINDEX, TROPONINI in the last 168 hours. BNP (last 3  results) No results for input(s): PROBNP in the last 8760 hours. HbA1C: No results for input(s): HGBA1C in the last 72 hours. CBG: No results for input(s): GLUCAP in the last 168 hours. Lipid Profile: No results for input(s): CHOL, HDL, LDLCALC, TRIG, CHOLHDL, LDLDIRECT in the last 72 hours. Thyroid Function Tests: No results for input(s): TSH, T4TOTAL, FREET4, T3FREE, THYROIDAB in the last 72 hours. Anemia Panel: No results for input(s): VITAMINB12, FOLATE, FERRITIN, TIBC, IRON, RETICCTPCT in the last 72 hours. Urine analysis:    Component Value Date/Time   COLORURINE YELLOW 01/30/2016 [place] 01/30/2016 1653   LABSPEC 1.008 01/30/2016 1653   PHURINE 6.5 01/30/2016 1653   GLUCOSEU NEGATIVE 01/30/2016 1653   HGBUR MODERATE (A) 01/30/2016 1653   HGBUR small 12/13/2009 0904   BILIRUBINUR NEGATIVE 01/30/2016 1653   BILIRUBINUR ne 01/30/2015 1543   KETONESUR NEGATIVE 01/30/2016 1653   PROTEINUR NEGATIVE 01/30/2016 1653   UROBILINOGEN 0.2 01/30/2015 1543   UROBILINOGEN 0.2 09/25/2013 1350   NITRITE NEGATIVE 01/30/2016 1653   LEUKOCYTESUR TRACE (A) 01/30/2016 1653   Sepsis Labs: @LABRCNTIP (procalcitonin:4,lacticidven:4)  ) Recent Results (from the past 240 hour(s))  MRSA PCR Screening     Status: None   Collection Time: 01/22/16 10:55 AM  Result Value Ref Range Status   MRSA by PCR NEGATIVE NEGATIVE Final    Comment:        The GeneXpert MRSA Assay (FDA approved for NASAL specimens only), is one component of a comprehensive MRSA colonization surveillance program. It is not intended to diagnose MRSA infection nor to guide or monitor treatment for MRSA infections.   Aerobic/Anaerobic Culture (surgical/deep wound)     Status: None   Collection Time: 01/22/16  1:17 PM  Result Value Ref Range Status   Specimen Description ABSCESS PERIRECTAL  Final   Special Requests NONE  Final   Gram Stain   Final    FEW WBC PRESENT,BOTH PMN AND MONONUCLEAR MANY GRAM  POSITIVE COCCI IN PAIRS AND CHAINS MANY GRAM NEGATIVE RODS MANY GRAM POSITIVE RODS MODERATE GRAM NEGATIVE COCCOBACILLI RARE YEAST Performed at [facility]    Culture MULTIPLE ORGANISMS PRESENT, NONE PREDOMINANT  Final   Report Status 01/29/2016 FINAL  Final  Culture, blood (routine x 2)     Status: None (Preliminary result)   Collection Time: 01/30/16  2:47 PM  Result Value Ref  Range Status   Specimen Description BLOOD BLOOD RIGHT ARM  Final   Special Requests BOTTLES DRAWN AEROBIC AND ANAEROBIC 5 CC  Final   Culture   Final    NO GROWTH < 24 HOURS Performed at [facility]    Report Status PENDING  Incomplete  Culture, blood (routine x 2)     Status: None (Preliminary result)   Collection Time: 01/30/16  2:47 PM  Result Value Ref Range Status   Specimen Description BLOOD BLOOD RIGHT ARM  Final   Special Requests BOTTLES DRAWN AEROBIC AND ANAEROBIC 5 CC  Final   Culture   Final    NO GROWTH < 24 HOURS Performed at [facility]    Report Status PENDING  Incomplete  Urine culture     Status: None   Collection Time: 01/30/16  4:53 PM  Result Value Ref Range Status   Specimen Description URINE, RANDOM  Final   Special Requests NONE  Final   Culture NO GROWTH Performed at [facility]   Final   Report Status 01/31/2016 FINAL  Final      Radiology Studies: Ct Pelvis W Contrast  Result Date: 01/30/2016 CLINICAL DATA:  Follow-up perirectal abscess EXAM: CT PELVIS WITH CONTRAST TECHNIQUE: Multidetector CT imaging of the pelvis was performed using the standard protocol following the bolus administration of intravenous contrast. CONTRAST:  170m ISOVUE-300 IOPAMIDOL (ISOVUE-300) INJECTION 61% COMPARISON:  01/21/2016 FINDINGS: Urinary Tract: The bladder is well distended. No filling defects are noted. Bowel: The appendix is not well visualized and may have been surgically removed. No obstructive changes are seen. Vascular/Lymphatic: No pathologically  enlarged lymph nodes. No significant vascular abnormality seen. Reproductive: The uterus has been surgically removed. No pelvic mass lesion is seen. Other: There are changes consistent with the known history of perirectal abscess which has been debrided in the interval from the prior exam. A surgical drain is noted in place. Scattered air related to the recent surgery is seen. No demonstrable residual fluid collection is noted. The degree of air within the pelvis has nearly completely resolved when compared with the prior study. No new abscess is seen. Musculoskeletal: Within normal limits. IMPRESSION: Status post debridement of a perirectal abscess. There is been significant improvement with no significant residual fluid collection identified. Surgical drain is noted in place. Electronically Signed   By: MInez CatalinaM.D.   On: 01/30/2016 21:53   Dg Chest Port 1 View  Result Date: 01/30/2016 CLINICAL DATA:  Fever. severe Crohns colitis with deep rectal ulcers (no improvement at time of last flex 9/12), and development of large complex perirectal abscess while on IV steroids -s/p I/D 9/26 Drain remains in Her pain is not improving , seems to be worse last couple daysCopious drainage of stool/blood from rectum Hx hx left breast cancer with lumpectomy 2016 EXAM: PORTABLE CHEST - 1 VIEW COMPARISON:  01/07/2016 FINDINGS: Lungs are clear. Heart size and mediastinal contours are within normal limits. No effusion. Postop changes left breast.  Regional bones unremarkable. IMPRESSION: No acute cardiopulmonary disease. Electronically Signed   By: DLucrezia EuropeM.D.   On: 01/30/2016 14:18     Scheduled Meds: . dicyclomine  10 mg Oral TID AC & HS  . fluconazole  200 mg Oral Once  . FLUoxetine  40 mg Oral Daily  . heparin subcutaneous  5,000 Units Subcutaneous Q12H  . methylPREDNISolone (SOLU-MEDROL) injection  60 mg Intravenous Daily  . midazolam  2 mg Intravenous Once  . oxymorphone  10 mg Oral QAC breakfast  .  oxymorphone  10 mg Oral QHS  . potassium chloride  40 mEq Oral Once   Continuous Infusions: . sodium chloride 100 mL/hr at 02/01/16 0007     LOS: 24 days    Sara Lippy  MD. PhD on 02/01/2016 at 8:44 AM  Between 7am to 7pm - Pager - [phone]  After 7pm go to [email or web address] - password [email or web address]  And look for the night coverage person covering for me after hours  [facility] Office  [phone]

## 2016-02-01 NOTE — Progress Notes (Signed)
PT Cancellation Note  Patient Details Name: VICY MEDICO MRN: [id number] DOB: 02/27/1941   Cancelled Treatment:     pt declined.  Stated she was able to take a shower and walked with her sister and NT Danielle before lunch.  Now tiered and requesting to rest.     Rica Koyanagi  PTA [facility]  Acute  Rehab Pager      [phone]

## 2016-02-01 NOTE — Progress Notes (Signed)
10 Days Post-Op  Subjective: Patient has not had pain medicine in about 8 hours because she was asleep, so she is some pain right now. Afebrile, T max 99.2 WBC normal  Objective: Vital signs in last 24 hours: Temp:  [98.3 F (36.8 C)-99.2 F (37.3 C)] 98.3 F (36.8 C) (10/05 2159) Pulse Rate:  [73-97] 73 (10/05 2159) Resp:  [16] 16 (10/05 2159) BP: (104-105)/(58-61) 104/61 (10/05 2159) SpO2:  [98 %] 98 % (10/05 2159) Last BM Date: 01/30/16  Intake/Output from previous day: 10/05 0701 - 10/06 0700 In: 2514.2 [P.O.:200; I.V.:2314.2] Out: -  Intake/Output this shift: No intake/output data recorded.  GEN - WDWN in NAD Perineum - thick bloody drainage coming from abscess cavity around Penrose drain  Lab Results:   Recent Labs  01/31/16 0753 02/01/16 0553  WBC 7.1 4.2  HGB 10.4* 9.3*  HCT 31.3* 28.0*  PLT 155 150   BMET  Recent Labs  01/31/16 0753 02/01/16 0553  NA 130* 135  K 3.3* 3.2*  CL 101 105  CO2 25 25  GLUCOSE 87 98  BUN <5* <5*  CREATININE 0.39* 0.40*  CALCIUM 7.5* 7.7*   PT/INR No results for input(s): LABPROT, INR in the last 72 hours. ABG No results for input(s): PHART, HCO3 in the last 72 hours.  Invalid input(s): PCO2, PO2  Studies/Results: Ct Pelvis W Contrast  Result Date: 01/30/2016 CLINICAL DATA:  Follow-up perirectal abscess EXAM: CT PELVIS WITH CONTRAST TECHNIQUE: Multidetector CT imaging of the pelvis was performed using the standard protocol following the bolus administration of intravenous contrast. CONTRAST:  178m ISOVUE-300 IOPAMIDOL (ISOVUE-300) INJECTION 61% COMPARISON:  01/21/2016 FINDINGS: Urinary Tract: The bladder is well distended. No filling defects are noted. Bowel: The appendix is not well visualized and may have been surgically removed. No obstructive changes are seen. Vascular/Lymphatic: No pathologically enlarged lymph nodes. No significant vascular abnormality seen. Reproductive: The uterus has been surgically removed.  No pelvic mass lesion is seen. Other: There are changes consistent with the known history of perirectal abscess which has been debrided in the interval from the prior exam. A surgical drain is noted in place. Scattered air related to the recent surgery is seen. No demonstrable residual fluid collection is noted. The degree of air within the pelvis has nearly completely resolved when compared with the prior study. No new abscess is seen. Musculoskeletal: Within normal limits. IMPRESSION: Status post debridement of a perirectal abscess. There is been significant improvement with no significant residual fluid collection identified. Surgical drain is noted in place. Electronically Signed   By: MInez CatalinaM.D.   On: 01/30/2016 21:53   Dg Chest Port 1 View  Result Date: 01/30/2016 CLINICAL DATA:  Fever. severe Crohns colitis with deep rectal ulcers (no improvement at time of last flex 9/12), and development of large complex perirectal abscess while on IV steroids -s/p I/D 9/26 Drain remains in Her pain is not improving , seems to be worse last couple daysCopious drainage of stool/blood from rectum Hx hx left breast cancer with lumpectomy 2016 EXAM: PORTABLE CHEST - 1 VIEW COMPARISON:  01/07/2016 FINDINGS: Lungs are clear. Heart size and mediastinal contours are within normal limits. No effusion. Postop changes left breast.  Regional bones unremarkable. IMPRESSION: No acute cardiopulmonary disease. Electronically Signed   By: DLucrezia EuropeM.D.   On: 01/30/2016 14:18    Anti-infectives: Anti-infectives    Start     Dose/Rate Route Frequency Ordered Stop   01/31/16 1400  fluconazole (DIFLUCAN) tablet 200  mg     200 mg Oral  Once 01/31/16 1204     01/31/16 0000  imipenem-cilastatin (PRIMAXIN) 500 mg in sodium chloride 0.9 % 100 mL IVPB  Status:  Discontinued     500 mg 200 mL/hr over 30 Minutes Intravenous Every 6 hours 01/30/16 1840 01/31/16 0957   01/30/16 1900  imipenem-cilastatin (PRIMAXIN) 500 mg in sodium  chloride 0.9 % 100 mL IVPB     500 mg 200 mL/hr over 30 Minutes Intravenous STAT 01/30/16 1840 01/30/16 1956   01/29/16 2200  metroNIDAZOLE (FLAGYL) tablet 500 mg  Status:  Discontinued     500 mg Oral Every 8 hours 01/29/16 1700 01/30/16 1829   01/29/16 2000  ciprofloxacin (CIPRO) tablet 500 mg  Status:  Discontinued     500 mg Oral 2 times daily 01/29/16 1700 01/30/16 1829   01/22/16 0900  piperacillin-tazobactam (ZOSYN) IVPB 3.375 g  Status:  Discontinued     3.375 g 12.5 mL/hr over 240 Minutes Intravenous Every 8 hours 01/22/16 0840 01/29/16 1700   01/21/16 1830  ciprofloxacin (CIPRO) IVPB 400 mg  Status:  Discontinued     400 mg 200 mL/hr over 60 Minutes Intravenous Every 12 hours 01/21/16 1744 01/22/16 0840   01/21/16 1830  metroNIDAZOLE (FLAGYL) IVPB 500 mg  Status:  Discontinued     500 mg 100 mL/hr over 60 Minutes Intravenous Every 6 hours 01/21/16 1744 01/22/16 0840      Assessment/Plan: s/p Procedure(s): IRRIGATION AND DEBRIDEMENT PERIRECTAL ABSCESS (N/A) POD#8 S/p incision, drainage, and packing of complex perirectal abscess - packing removed POD#1, penrose drain in place to allow for continued drainage and control of possible fistulae - may d/c IV abx from surgical standpoint, WBC WNL  - Remicade, steroids per GI - TID sitz baths, showers for wound care (mild soap/warm water) - encourage mobilization with PT  - discussed a diverting colostomy with the patient as an option for promoting wound healing and decreasing pain/contamination. Patient wishes to hold off on surgical intervention at this time.  LOS: 24 days    Terence Googe K. 02/01/2016

## 2016-02-02 LAB — C-REACTIVE PROTEIN: CRP: 6.3 mg/dL — ABNORMAL HIGH (ref <1.0)

## 2016-02-02 LAB — BASIC METABOLIC PANEL
BUN: 6 mg/dL (ref 6–20)
CALCIUM: 7.8 mg/dL — AB (ref 8.9–10.3)
CO2: 25 mmol/L (ref 22–32)
Chloride: 107 mmol/L (ref 101–111)
Creatinine, Ser: 0.35 mg/dL — ABNORMAL LOW (ref 0.44–1.00)
GFR calc non Af Amer: >60 mL/min (ref >60)
Glucose, Bld: 86 mg/dL (ref 65–99)
Potassium: 3.7 mmol/L (ref 3.5–5.1)
SODIUM: 134 mmol/L — AB (ref 135–145)

## 2016-02-02 LAB — CBC WITH DIFFERENTIAL/PLATELET
Basophils Absolute: 0 10*3/uL (ref 0.0–0.1)
Basophils Relative: 0 %
Eosinophils Absolute: 0 10*3/uL (ref 0.0–0.7)
Eosinophils Relative: 0 %
HCT: 27.6 % — ABNORMAL LOW (ref 36.0–46.0)
HEMOGLOBIN: 9 g/dL — AB (ref 12.0–15.0)
LYMPHS ABS: 1.2 10*3/uL (ref 0.7–4.0)
LYMPHS PCT: 24 %
MCH: 29.6 pg (ref 26.0–34.0)
MCHC: 32.6 g/dL (ref 30.0–36.0)
MCV: 90.8 fL (ref 78.0–100.0)
MONO ABS: 0.5 10*3/uL (ref 0.1–1.0)
MONOS PCT: 10 %
NEUTROS ABS: 3.3 10*3/uL (ref 1.7–7.7)
NEUTROS PCT: 66 %
Platelets: 165 10*3/uL (ref 150–400)
RBC: 3.04 MIL/uL — ABNORMAL LOW (ref 3.87–5.11)
RDW: 14.8 % (ref 11.5–15.5)
WBC: 5 10*3/uL (ref 4.0–10.5)

## 2016-02-02 LAB — MAGNESIUM: MAGNESIUM: 1.8 mg/dL (ref 1.7–2.4)

## 2016-02-02 MED ORDER — METRONIDAZOLE 500 MG PO TABS
500.0000 mg | ORAL_TABLET | Freq: Two times a day (BID) | ORAL | Status: DC
  Administered 2016-02-02 – 2016-02-08 (×13): 500 mg via ORAL
  Filled 2016-02-02 (×13): qty 1

## 2016-02-02 MED ORDER — CIPROFLOXACIN HCL 500 MG PO TABS
500.0000 mg | ORAL_TABLET | Freq: Two times a day (BID) | ORAL | Status: DC
  Administered 2016-02-02 – 2016-02-20 (×37): 500 mg via ORAL
  Filled 2016-02-02 (×37): qty 1

## 2016-02-02 NOTE — Progress Notes (Signed)
[place] [facility] ROUNDING NOTE   Subjective: Feels better, reports decreased drainage and also starting to have little more formed stools. 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 Remains afebrile.    Objective: Vital signs in last 24 hours: Temp:  [97.7 F (36.5 C)-98.5 F (36.9 C)] 97.7 F (36.5 C) (10/07 0413) Pulse Rate:  [63-70] 66 (10/07 0413) Resp:  [17-18] 17 (10/07 0413) BP: (89-127)/(48-62) 127/62 (10/07 0413) SpO2:  [98 %-100 %] 100 % (10/07 0413) Last BM Date: 02/02/16  General:  no acute distress Eyes:  anicteric. Lungs: Clear to auscultation bilaterally. Heart:    S1S2, no rubs, murmurs, gallops. Abdomen:  soft, non-tender, no hepatosplenomegaly, hernia, or mass and BS+.  Extremities:   no edema Neuro:  A&O x 3.  Psych:  appropriate mood and  Affect.   Intake/Output from previous day: 10/06 0701 - 10/07 0700 In: 1500 [P.O.:600; I.V.:900] Out: -  Intake/Output this shift: No intake/output data recorded.   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 status post incision and drainage with packing of complex perirectal abscess, packing was removed next day and currently has a Penrose drain She received second induction dose of Remicade on 01/28/2016 Continue IV Solu-Medrol 40 mg daily, will consider switching to by mouth prednisone tomorrow By mouth Cipro and  Flagyl x 7-10 days Okay to DC IV fluids, encourage oral intake Notes improvement in drainage from the fistula and also rectal pain Continue Opana and IV Dilaudid as needed We will continue to monitor CRP Given significant improvement in symptoms will hold off flexible sigmoidoscopy at this point     Damaris Hippo , MD [phone] Mon-Fri 8a-5p [phone] after 5p, weekends, holidays [facility]

## 2016-02-02 NOTE — Progress Notes (Signed)
PROGRESS NOTE    Sara Barnes  SKA:[id number] DOB: 1940/12/12 DOA: 01/07/2016 PCP: Ann Held, DO   Chief Complaint  Patient presents with  . Rectal Bleeding    SINCE FRIDAY     Brief Narrative:  HPI on 01/07/2016 by Dr. Gilman Schmidt [name] a 75 y.o.femalewith medical history significant of ulcerative colitis versus Crohn's disease, believed to be UC eventually,otherwise healthy, presents to the emergency room with chief complaint of severe abdominal pain for the past several days. She was recently hospitalized and discharged home about 9 days prior to admission. She was here in the hospital with similar complaints, placed on steroids, and tells me today that she improved some and really wanted to go home. At home, she has had ongoing symptoms, never really well achieved full remission, and over the last few days she has felt extremely unwell. She has crampy abdominal pain, nausea, poor appetite, barely able to keep down some Gatorade, and has been having significant amounts of bloody bowel movements as well. She has no fever or chills. She has no chest pain or shortness of breath. She denies any palpitations. She has no lightheadedness or dizziness. She feels generalized weakness. In the emergency room her vital signs are stable, her blood work is essentially unremarkable. Gastroenterology was consulted by EDP and have evaluated patient, and TRH was asked for admission  Interim history Gastroenterology consultation appreciated. Patient underwent flexible sigmoidoscopy. CMV was negative and she was started on Remicade. She was initially doing better and tolerating some diet, GI following and since still having a lot of pain and frequent BMs will be dosed with another Remicaide infusion on  9/17. 9/21 pain and bloody clots worsened overnight. CT abd/pelvis showed perirectal abscess- General surgery Was consulted. Patient underwent I&D on 9/26. Steroids were decreased per  GI recommendations. Patient received dose of amikacin on 01/28/2016. Clinically continuing to improve. Patient is working with physical therapy  Assessment & Plan   Abdominal pain/diarrhea secondary to Ulcerative colitis flare with rectal bleeding/ Perianal abscess -Patient is s/p flexible sigmoidoscopy on 01/08/16, biopsy with findings of active erosive ulcer. No CMV found. Has been given Remicade on 01/09/2016, 01/13/16, and 01/28/2016.Patient is status post I&D of abscess -c diff test negative x2, she was treated with zosynx 1week ,then oral cipro and flagyl, however developed fever on 10/4, abx broadened to imipenem, -.IV Solu-Medrol continued at 15 mg every 12 hours, which will be continued per gastroenterology -repeat blood culture /cxr/ua on 10/4 unremarkable, repeat CT ab/pel on 10/4 Ct shows abscess much improved with no residual fluid collection , drain culture multiple organisms, none predominant -will have general surgery and GI to guide on steroid/abx/remicade use/diverting colostomy  Gastroenterology and surgery input appreciated  Acute blood loss anemia/Symptomatic anemia -Likely secondary to the above, hgb 7.6 on admission -s/p prbc transfusion  On 9/25 and 9/26 hgb has been stable since  Depression/Anxiety -Prozac has been continued and patient is tolerating  Debility -PT/OT has been consulted -Recommendations have been made for skilled nursing facility  Thrombocytopenia -Lovenox was held on 01/26/2016 secondary to thrombocytopenia below 1000 -Had started SCDs for DVT prophylaxis -Platelets have improved 102,000 -Continue Lovenox as tolerated    Hypokalemia: replace k  b12 deficiency: b12 Im injection x1 on 10/6.  DVT Prophylaxis   heparin  Code Status: Full  Family Communication: Patient in room,   Disposition Plan:  anticipate skilled nursing facility eventually  Consultants Gastroenterology  General surgery  Procedures  Flexible  sigmoidoscopy Incision and drainage. No abscess on 01/22/2016 prbc transfusion   Antibiotics   Anti-infectives    Start     Dose/Rate Route Frequency Ordered Stop   02/02/16 1100  metroNIDAZOLE (FLAGYL) tablet 500 mg     500 mg Oral Every 12 hours 02/02/16 1024     02/02/16 1100  ciprofloxacin (CIPRO) tablet 500 mg     500 mg Oral 2 times daily 02/02/16 1024     01/31/16 1400  fluconazole (DIFLUCAN) tablet 200 mg     200 mg Oral  Once 01/31/16 1204     01/31/16 0000  imipenem-cilastatin (PRIMAXIN) 500 mg in sodium chloride 0.9 % 100 mL IVPB  Status:  Discontinued     500 mg 200 mL/hr over 30 Minutes Intravenous Every 6 hours 01/30/16 1840 01/31/16 0957   01/30/16 1900  imipenem-cilastatin (PRIMAXIN) 500 mg in sodium chloride 0.9 % 100 mL IVPB     500 mg 200 mL/hr over 30 Minutes Intravenous STAT 01/30/16 1840 01/30/16 1956   01/29/16 2200  metroNIDAZOLE (FLAGYL) tablet 500 mg  Status:  Discontinued     500 mg Oral Every 8 hours 01/29/16 1700 01/30/16 1829   01/29/16 2000  ciprofloxacin (CIPRO) tablet 500 mg  Status:  Discontinued     500 mg Oral 2 times daily 01/29/16 1700 01/30/16 1829   01/22/16 0900  piperacillin-tazobactam (ZOSYN) IVPB 3.375 g  Status:  Discontinued     3.375 g 12.5 mL/hr over 240 Minutes Intravenous Every 8 hours 01/22/16 0840 01/29/16 1700   01/21/16 1830  ciprofloxacin (CIPRO) IVPB 400 mg  Status:  Discontinued     400 mg 200 mL/hr over 60 Minutes Intravenous Every 12 hours 01/21/16 1744 01/22/16 0840   01/21/16 1830  metroNIDAZOLE (FLAGYL) IVPB 500 mg  Status:  Discontinued     500 mg 100 mL/hr over 60 Minutes Intravenous Every 6 hours 01/21/16 1744 01/22/16 0840      Subjective:    she is feeling better, no fever, diarrhea seems has slowed down,   Objective:   Vitals:   02/01/16 1449 02/01/16 1645 02/01/16 2102 02/02/16 0413  BP: (!) 89/48 (!) 110/54 108/60 127/62  Pulse: 70  63 66  Resp: 18  18 17   Temp: 98.5 F (36.9 C)  98.2 F (36.8 C)  97.7 F (36.5 C)  TempSrc: Oral  Oral Oral  SpO2: 98%  99% 100%  Weight:      Height:        Intake/Output Summary (Last 24 hours) at 02/02/16 1115 Last data filed at 02/02/16 0827  Gross per 24 hour  Intake             1740 ml  Output                0 ml  Net             1740 ml   Filed Weights   01/07/16 1136  Weight: 72.6 kg (160 lb)    Exam  General:  Awake, in no acute distress, hard of hearing  HEENT: Pupils equal round reactive to light, dentition fair  Cardiovascular: Regular rate, S1-S2  Respiratory: Normal chest rise, no crackles   Abdomen: Positive bowel sounds, generally tender  Extremities: No cyanosis, no joint deformities  Neuro: Cranial nerves II through XII grossly intact, sensation intact  Psych: Normal affect, no auditory hallucinations  Skin: No pallor, no rashes   Data Reviewed: I have personally reviewed following labs and imaging  studies  CBC:  Recent Labs Lab 01/29/16 0543 01/30/16 0612 01/31/16 0753 02/01/16 0553 02/02/16 0618  WBC 8.0 5.8 7.1 4.2 5.0  NEUTROABS 6.6 4.3 5.6 2.7 3.3  HGB 11.0* 10.6* 10.4* 9.3* 9.0*  HCT 33.8* 32.4* 31.3* 28.0* 27.6*  MCV 87.8 90.0 89.2 89.5 90.8  PLT 120* 133* 155 150 161   Basic Metabolic Panel:  Recent Labs Lab 01/30/16 0612 01/31/16 0753 02/01/16 0553 02/02/16 0618  NA 134* 130* 135 134*  K 3.8 3.3* 3.2* 3.7  CL 101 101 105 107  CO2 28 25 25 25   GLUCOSE 76 87 98 86  BUN 7 <5* <5* 6  CREATININE 0.51 0.39* 0.40* 0.35*  CALCIUM 8.1* 7.5* 7.7* 7.8*  MG  --  1.8 1.8 1.8   GFR: Estimated Creatinine Clearance: 62.9 mL/min (by C-G formula based on SCr of 0.35 mg/dL (L)). Liver Function Tests: No results for input(s): AST, ALT, ALKPHOS, BILITOT, PROT, ALBUMIN in the last 168 hours. No results for input(s): LIPASE, AMYLASE in the last 168 hours. No results for input(s): AMMONIA in the last 168 hours. Coagulation Profile: No results for input(s): INR, PROTIME in the last 168  hours. Cardiac Enzymes: No results for input(s): CKTOTAL, CKMB, CKMBINDEX, TROPONINI in the last 168 hours. BNP (last 3 results) No results for input(s): PROBNP in the last 8760 hours. HbA1C: No results for input(s): HGBA1C in the last 72 hours. CBG: No results for input(s): GLUCAP in the last 168 hours. Lipid Profile: No results for input(s): CHOL, HDL, LDLCALC, TRIG, CHOLHDL, LDLDIRECT in the last 72 hours. Thyroid Function Tests: No results for input(s): TSH, T4TOTAL, FREET4, T3FREE, THYROIDAB in the last 72 hours. Anemia Panel: No results for input(s): VITAMINB12, FOLATE, FERRITIN, TIBC, IRON, RETICCTPCT in the last 72 hours. Urine analysis:    Component Value Date/Time   COLORURINE YELLOW 01/30/2016 [place] 01/30/2016 1653   LABSPEC 1.008 01/30/2016 1653   PHURINE 6.5 01/30/2016 1653   GLUCOSEU NEGATIVE 01/30/2016 1653   HGBUR MODERATE (A) 01/30/2016 1653   HGBUR small 12/13/2009 0904   BILIRUBINUR NEGATIVE 01/30/2016 1653   BILIRUBINUR ne 01/30/2015 1543   KETONESUR NEGATIVE 01/30/2016 1653   PROTEINUR NEGATIVE 01/30/2016 1653   UROBILINOGEN 0.2 01/30/2015 1543   UROBILINOGEN 0.2 09/25/2013 1350   NITRITE NEGATIVE 01/30/2016 1653   LEUKOCYTESUR TRACE (A) 01/30/2016 1653   Sepsis Labs: @LABRCNTIP (procalcitonin:4,lacticidven:4)  ) Recent Results (from the past 240 hour(s))  Culture, blood (routine x 2)     Status: None (Preliminary result)   Collection Time: 01/30/16  2:47 PM  Result Value Ref Range Status   Specimen Description BLOOD BLOOD RIGHT ARM  Final   Special Requests BOTTLES DRAWN AEROBIC AND ANAEROBIC 5 CC  Final   Culture   Final    NO GROWTH 2 DAYS Performed at [facility]    Report Status PENDING  Incomplete  Culture, blood (routine x 2)     Status: None (Preliminary result)   Collection Time: 01/30/16  2:47 PM  Result Value Ref Range Status   Specimen Description BLOOD BLOOD RIGHT ARM  Final   Special Requests BOTTLES  DRAWN AEROBIC AND ANAEROBIC 5 CC  Final   Culture   Final    NO GROWTH 2 DAYS Performed at [facility]    Report Status PENDING  Incomplete  Urine culture     Status: None   Collection Time: 01/30/16  4:53 PM  Result Value Ref Range Status   Specimen  Description URINE, RANDOM  Final   Special Requests NONE  Final   Culture NO GROWTH Performed at [facility]   Final   Report Status 01/31/2016 FINAL  Final      Radiology Studies: No results found.   Scheduled Meds: . ciprofloxacin  500 mg Oral BID  . dicyclomine  10 mg Oral TID AC & HS  . fluconazole  200 mg Oral Once  . FLUoxetine  40 mg Oral Daily  . heparin subcutaneous  5,000 Units Subcutaneous Q12H  . methylPREDNISolone (SOLU-MEDROL) injection  40 mg Intravenous Daily  . metroNIDAZOLE  500 mg Oral Q12H  . midazolam  2 mg Intravenous Once  . oxymorphone  10 mg Oral QAC breakfast  . oxymorphone  10 mg Oral QHS   Continuous Infusions: . sodium chloride 100 mL/hr at 02/02/16 0521     LOS: 25 days    Deslyn Cavenaugh  MD. PhD on 02/02/2016 at 11:15 AM  Between 7am to 7pm - Pager - [phone]  After 7pm go to [email or web address] - password [email or web address]  And look for the night coverage person covering for me after hours  [facility] Office  [phone]

## 2016-02-02 NOTE — Progress Notes (Signed)
11 Days Post-Op  Subjective: Patient feeling better today Afebrile WBC normal   Objective: Vital signs in last 24 hours: Temp:  [97.7 F (36.5 C)-98.5 F (36.9 C)] 97.7 F (36.5 C) (10/07 0413) Pulse Rate:  [63-70] 66 (10/07 0413) Resp:  [17-18] 17 (10/07 0413) BP: (89-127)/(48-62) 127/62 (10/07 0413) SpO2:  [98 %-100 %] 100 % (10/07 0413) Last BM Date: 02/02/16  Intake/Output from previous day: 10/06 0701 - 10/07 0700 In: 1500 [P.O.:600; I.V.:900] Out: -  Intake/Output this shift: No intake/output data recorded.  GEN - WDWN in NAD Perineum - thick bloody drainage coming from abscess cavity around Penrose drain  Lab Results:   Recent Labs  02/01/16 0553 02/02/16 0618  WBC 4.2 5.0  HGB 9.3* 9.0*  HCT 28.0* 27.6*  PLT 150 165   BMET  Recent Labs  02/01/16 0553 02/02/16 0618  NA 135 134*  K 3.2* 3.7  CL 105 107  CO2 25 25  GLUCOSE 98 86  BUN <5* 6  CREATININE 0.40* 0.35*  CALCIUM 7.7* 7.8*   PT/INR No results for input(s): LABPROT, INR in the last 72 hours. ABG No results for input(s): PHART, HCO3 in the last 72 hours.  Invalid input(s): PCO2, PO2  Studies/Results: No results found.  Anti-infectives: Anti-infectives    Start     Dose/Rate Route Frequency Ordered Stop   01/31/16 1400  fluconazole (DIFLUCAN) tablet 200 mg     200 mg Oral  Once 01/31/16 1204     01/31/16 0000  imipenem-cilastatin (PRIMAXIN) 500 mg in sodium chloride 0.9 % 100 mL IVPB  Status:  Discontinued     500 mg 200 mL/hr over 30 Minutes Intravenous Every 6 hours 01/30/16 1840 01/31/16 0957   01/30/16 1900  imipenem-cilastatin (PRIMAXIN) 500 mg in sodium chloride 0.9 % 100 mL IVPB     500 mg 200 mL/hr over 30 Minutes Intravenous STAT 01/30/16 1840 01/30/16 1956   01/29/16 2200  metroNIDAZOLE (FLAGYL) tablet 500 mg  Status:  Discontinued     500 mg Oral Every 8 hours 01/29/16 1700 01/30/16 1829   01/29/16 2000  ciprofloxacin (CIPRO) tablet 500 mg  Status:  Discontinued     500 mg Oral 2 times daily 01/29/16 1700 01/30/16 1829   01/22/16 0900  piperacillin-tazobactam (ZOSYN) IVPB 3.375 g  Status:  Discontinued     3.375 g 12.5 mL/hr over 240 Minutes Intravenous Every 8 hours 01/22/16 0840 01/29/16 1700   01/21/16 1830  ciprofloxacin (CIPRO) IVPB 400 mg  Status:  Discontinued     400 mg 200 mL/hr over 60 Minutes Intravenous Every 12 hours 01/21/16 1744 01/22/16 0840   01/21/16 1830  metroNIDAZOLE (FLAGYL) IVPB 500 mg  Status:  Discontinued     500 mg 100 mL/hr over 60 Minutes Intravenous Every 6 hours 01/21/16 1744 01/22/16 0840      Assessment/Plan: s/p Procedure(s): IRRIGATION AND DEBRIDEMENT PERIRECTAL ABSCESS (N/A)  S/p incision, drainage, and packing of complex perirectal abscess - packing removed POD#1, penrose drain in place to allow for continued drainage and control of possible fistulae - may d/c IV abx from surgical standpoint, WBC WNL  - Remicade, steroids per GI - TID sitz baths, showers for wound care (mild soap/warm water) - encourage mobilization with PT  Seems to be improving   LOS: 25 days    Sara Barnes C. 35/[id number]/7573

## 2016-02-03 DIAGNOSIS — K604 Rectal fistula: Secondary | ICD-10-CM

## 2016-02-03 LAB — BASIC METABOLIC PANEL
ANION GAP: 6 (ref 5–15)
BUN: <5 mg/dL — ABNORMAL LOW (ref 6–20)
CALCIUM: 8 mg/dL — AB (ref 8.9–10.3)
CO2: 26 mmol/L (ref 22–32)
CREATININE: 0.33 mg/dL — AB (ref 0.44–1.00)
Chloride: 103 mmol/L (ref 101–111)
GFR calc Af Amer: >60 mL/min (ref >60)
GLUCOSE: 81 mg/dL (ref 65–99)
POTASSIUM: 3.6 mmol/L (ref 3.5–5.1)
Sodium: 135 mmol/L (ref 135–145)

## 2016-02-03 LAB — CBC WITH DIFFERENTIAL/PLATELET
BASOS ABS: 0 10*3/uL (ref 0.0–0.1)
Basophils Relative: 0 %
Eosinophils Absolute: 0 10*3/uL (ref 0.0–0.7)
Eosinophils Relative: 0 %
HEMATOCRIT: 27.8 % — AB (ref 36.0–46.0)
HEMOGLOBIN: 8.9 g/dL — AB (ref 12.0–15.0)
LYMPHS PCT: 19 %
Lymphs Abs: 1.6 10*3/uL (ref 0.7–4.0)
MCH: 29.4 pg (ref 26.0–34.0)
MCHC: 32 g/dL (ref 30.0–36.0)
MCV: 91.7 fL (ref 78.0–100.0)
Monocytes Absolute: 0.6 10*3/uL (ref 0.1–1.0)
Monocytes Relative: 7 %
NEUTROS ABS: 6.3 10*3/uL (ref 1.7–7.7)
NEUTROS PCT: 74 %
PLATELETS: 188 10*3/uL (ref 150–400)
RBC: 3.03 MIL/uL — AB (ref 3.87–5.11)
RDW: 14.8 % (ref 11.5–15.5)
WBC: 8.5 10*3/uL (ref 4.0–10.5)

## 2016-02-03 LAB — C-REACTIVE PROTEIN: CRP: 2.7 mg/dL — AB (ref <1.0)

## 2016-02-03 MED ORDER — POTASSIUM CHLORIDE CRYS ER 20 MEQ PO TBCR
40.0000 meq | EXTENDED_RELEASE_TABLET | Freq: Once | ORAL | Status: AC
  Administered 2016-02-03: 40 meq via ORAL
  Filled 2016-02-03: qty 2

## 2016-02-03 NOTE — Progress Notes (Addendum)
[facility] Office:  [phone] General Surgery Progress Note   LOS: 26 days  POD -  12 Days Post-Op  Assessment/Plan: 1.  IRRIGATION AND DEBRIDEMENT PERIRECTAL ABSCESS, with seton - 01/22/2016 - Gerkin  Cipro/Flagyl  On remicaide and solumedrol  Tough situation.  She has stool pouring out around seton - so she has no control of stool and preferential drainage of stool is around seton  I expect that she will need a diverting colostomy to ever control her rectal disease/perineal disease.  She has refused surgery so far.  This may require a more forceful discussion with her son, GI, and surgery involved.   2.  History of UC vs crohn's disease 3.  Anemia   Hgb - 8.9 - 02/03/2016 4.  DVT prophylaxis -SQ Heparin 5.  Hard of hearing 6.  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  Widowed.  Has one son who lives in [place].  Dr. Hilarie Fredrickson is her regular GI doctor and her PCP is Dr. Etter Sjogren.  Objective:   Vitals:   02/03/16 0430 02/03/16 0901  BP: (!) 113/54   Pulse: 70   Resp: 16   Temp: 97.6 F (36.4 C) 98.5 F (36.9 C)     Intake/Output from previous day:  10/07 0701 - 10/08 0700 In: 1400 [P.O.:300; I.V.:1100] Out: -   Intake/Output this shift:  No intake/output data recorded.   Physical Exam:   General: Older WF who is alert and oriented.    HEENT: Normal. Pupils equal. .   Lungs: Clear   Abdomen: Soft   Wound: Stool coming out beside seton. No control of stool.  Sore.     Lab Results:    Recent Labs  02/02/16 0618 02/03/16 0643  WBC 5.0 8.5  HGB 9.0* 8.9*  HCT 27.6* 27.8*  PLT 165 188    BMET   Recent Labs  02/02/16 0618 02/03/16 0643  NA 134* 135  K 3.7 3.6  CL 107 103  CO2 25 26  GLUCOSE 86 81  BUN 6 <5*  CREATININE 0.35* 0.33*  CALCIUM 7.8* 8.0*    PT/INR  No results for input(s): LABPROT, INR in the last 72 hours.  ABG  No results for input(s): PHART, HCO3 in the last 72 hours.  Invalid input(s): PCO2, PO2   Studies/Results:  No results found.   Anti-infectives:   Anti-infectives    Start     Dose/Rate Route Frequency Ordered Stop   02/02/16 1100  metroNIDAZOLE (FLAGYL) tablet 500 mg     500 mg Oral Every 12 hours 02/02/16 1024     02/02/16 1100  ciprofloxacin (CIPRO) tablet 500 mg     500 mg Oral 2 times daily 02/02/16 1024     01/31/16 1400  fluconazole (DIFLUCAN) tablet 200 mg     200 mg Oral  Once 01/31/16 1204     01/31/16 0000  imipenem-cilastatin (PRIMAXIN) 500 mg in sodium chloride 0.9 % 100 mL IVPB  Status:  Discontinued     500  mg 200 mL/hr over 30 Minutes Intravenous Every 6 hours 01/30/16 1840 01/31/16 0957   01/30/16 1900  imipenem-cilastatin (PRIMAXIN) 500 mg in sodium chloride 0.9 % 100 mL IVPB     500 mg 200 mL/hr over 30 Minutes Intravenous STAT 01/30/16 1840 01/30/16 1956   01/29/16 2200  metroNIDAZOLE (FLAGYL) tablet 500 mg  Status:  Discontinued     500 mg Oral Every 8 hours 01/29/16 1700 01/30/16 1829   01/29/16 2000  ciprofloxacin (CIPRO) tablet 500 mg  Status:  Discontinued     500 mg Oral 2 times daily 01/29/16 1700 01/30/16 1829   01/22/16 0900  piperacillin-tazobactam (ZOSYN) IVPB 3.375 g  Status:  Discontinued     3.375 g 12.5 mL/hr over 240 Minutes Intravenous Every 8 hours 01/22/16 0840 01/29/16 1700   01/21/16 1830  ciprofloxacin (CIPRO) IVPB 400 mg  Status:  Discontinued     400 mg 200 mL/hr over 60 Minutes Intravenous Every 12 hours 01/21/16 1744 01/22/16 0840   01/21/16 1830  metroNIDAZOLE (FLAGYL) IVPB 500 mg   Status:  Discontinued     500 mg 100 mL/hr over 60 Minutes Intravenous Every 6 hours 01/21/16 1744 01/22/16 0840      [name], MD, FACS Pager: [facility] Office: [phone] 02/03/2016

## 2016-02-03 NOTE — Progress Notes (Signed)
[facility] ROUNDING NOTE   Subjective: Patient reports doing better with decreased rectal drainage and improvement in pain. She wants to hold off diverting colostomy.    Objective: Vital signs in last 24 hours: Temp:  [97.5 F (36.4 C)-98.5 F (36.9 C)] 98.5 F (36.9 C) (10/08 0901) Pulse Rate:  [64-70] 70 (10/08 0430) Resp:  [16-18] 16 (10/08 0430) BP: (106-113)/(54-62) 113/54 (10/08 0430) SpO2:  [96 %-100 %] 100 % (10/08 0430) Last BM Date: 02/03/16 General:  Well-developed, well-nourished and in no acute distress Abdomen:  soft, non-tender, no hepatosplenomegaly, hernia, or mass and BS+.  Extremities:   no edema Skin   no rash. Neuro:  A&O x 3.  Psych:  appropriate mood and  Affect.    Intake/Output from previous day: 10/07 0701 - 10/08 0700 In: 1400 [P.O.:300; I.V.:1100] Out: -  Intake/Output this shift: No intake/output data recorded.   Lab Results:  Recent Labs  02/01/16 0553 02/02/16 0618 02/03/16 0643  WBC 4.2 5.0 8.5  HGB 9.3* 9.0* 8.9*  PLT 150 165 188  MCV 89.5 90.8 91.7   BMET  Recent Labs  02/01/16 0553 02/02/16 0618 02/03/16 0643  NA 135 134* 135  K 3.2* 3.7 3.6  CL 105 107 103  CO2 25 25 26   GLUCOSE 98 86 81  BUN <5* 6 <5*  CREATININE 0.40* 0.35* 0.33*  CALCIUM 7.7* 7.8* 8.0*   Reviewed labs, radiologic imaging, old records and pertinent past GI work up  Assessment & Plan  75 year old female with Crohn's colitis complicated by perirectal abscess and fistula status post incision and drainage with packing of complex perirectal abscess, packing was removed next day and currently has a Penrose drain She received second induction dose of Remicade on 01/28/2016, may need IV methyl prednisone 60 mg x 1 dose with next induction dose of Remicade Patient reports improvement in drainage from the fistula and also rectal pain, wants to hold off diverting colostomy Given significant improvement in symptoms and CRP trending is down,   will hold off flexible sigmoidoscopy at this point Cipro and Flagyl x 7-10 days Switch to Prednisone 59m and will taper 5 mg every week Continue Opana and IV Dilaudid as needed We will continue to monitor CRP    K. VDenzil Magnuson, MD [phone]-Fri 8a-5p [phone] 5p, weekends, holidays [facility]

## 2016-02-03 NOTE — Progress Notes (Signed)
PROGRESS NOTE    Sara Barnes  ERX:[id number] DOB: 20-Mar-1941 DOA: 01/07/2016 PCP: Ann Held, DO   Chief Complaint  Patient presents with  . Rectal Bleeding    SINCE FRIDAY     Brief Narrative:  HPI on 01/07/2016 by Dr. Gilman Schmidt [name] a 75 y.o.femalewith medical history significant of ulcerative colitis versus Crohn's disease, believed to be UC eventually,otherwise healthy, presents to the emergency room with chief complaint of severe abdominal pain for the past several days. She was recently hospitalized and discharged home about 9 days prior to admission. She was here in the hospital with similar complaints, placed on steroids, and tells me today that she improved some and really wanted to go home. At home, she has had ongoing symptoms, never really well achieved full remission, and over the last few days she has felt extremely unwell. She has crampy abdominal pain, nausea, poor appetite, barely able to keep down some Gatorade, and has been having significant amounts of bloody bowel movements as well. She has no fever or chills. She has no chest pain or shortness of breath. She denies any palpitations. She has no lightheadedness or dizziness. She feels generalized weakness. In the emergency room her vital signs are stable, her blood work is essentially unremarkable. Gastroenterology was consulted by EDP and have evaluated patient, and TRH was asked for admission  Interim history Gastroenterology consultation appreciated. Patient underwent flexible sigmoidoscopy. CMV was negative and she was started on Remicade. She was initially doing better and tolerating some diet, GI following and since still having a lot of pain and frequent BMs will be dosed with another Remicaide infusion on  9/17. 9/21 pain and bloody clots worsened overnight. CT abd/pelvis showed perirectal abscess- General surgery Was consulted. Patient underwent I&D on 9/26. Steroids were decreased per  GI recommendations. Patient received dose of amikacin on 01/28/2016. Clinically continuing to improve. Patient is working with physical therapy  Assessment & Plan   Abdominal pain/diarrhea secondary to Ulcerative colitis flare with rectal bleeding/ Perianal abscess -Patient is s/p flexible sigmoidoscopy on 01/08/16, biopsy with findings of active erosive ulcer. No CMV found. Has been given Remicade on 01/09/2016, 01/13/16, and 01/28/2016.Patient is status post I&D of abscess -c diff test negative x2, she was treated with zosynx 1week ,then oral cipro and flagyl, however developed fever on 10/4, abx broadened to imipenem, -.IV Solu-Medrol continued at 15 mg every 12 hours, which will be continued per gastroenterology -repeat blood culture /cxr/ua on 10/4 unremarkable, repeat CT ab/pel on 10/4 Ct shows abscess much improved with no residual fluid collection , drain culture multiple organisms, none predominant -will have general surgery and GI to guide on steroid/abx/remicade use/diverting colostomy  Gastroenterology and surgery input appreciated  Acute blood loss anemia/Symptomatic anemia -Likely secondary to the above, hgb 7.6 on admission -s/p prbc transfusion  On 9/25 and 9/26 hgb has been stable since  Depression/Anxiety -Prozac has been continued and patient is tolerating  Debility -PT/OT has been consulted -Recommendations have been made for skilled nursing facility  Thrombocytopenia -Lovenox was held on 01/26/2016 secondary to thrombocytopenia below 1000 -Had started SCDs for DVT prophylaxis -Platelets have improved 102,000 -Continue Lovenox as tolerated    Hypokalemia: replace k  b12 deficiency: b12 Im injection x1 on 10/6.  DVT Prophylaxis   heparin  Code Status: Full  Family Communication: Patient in room,   Disposition Plan:  anticipate skilled nursing facility eventually  Consultants Gastroenterology  General surgery  Procedures  Flexible  sigmoidoscopy Incision and drainage. No abscess on 01/22/2016 prbc transfusion   Antibiotics   Anti-infectives    Start     Dose/Rate Route Frequency Ordered Stop   02/02/16 1100  metroNIDAZOLE (FLAGYL) tablet 500 mg     500 mg Oral Every 12 hours 02/02/16 1024     02/02/16 1100  ciprofloxacin (CIPRO) tablet 500 mg     500 mg Oral 2 times daily 02/02/16 1024     01/31/16 1400  fluconazole (DIFLUCAN) tablet 200 mg     200 mg Oral  Once 01/31/16 1204     01/31/16 0000  imipenem-cilastatin (PRIMAXIN) 500 mg in sodium chloride 0.9 % 100 mL IVPB  Status:  Discontinued     500 mg 200 mL/hr over 30 Minutes Intravenous Every 6 hours 01/30/16 1840 01/31/16 0957   01/30/16 1900  imipenem-cilastatin (PRIMAXIN) 500 mg in sodium chloride 0.9 % 100 mL IVPB     500 mg 200 mL/hr over 30 Minutes Intravenous STAT 01/30/16 1840 01/30/16 1956   01/29/16 2200  metroNIDAZOLE (FLAGYL) tablet 500 mg  Status:  Discontinued     500 mg Oral Every 8 hours 01/29/16 1700 01/30/16 1829   01/29/16 2000  ciprofloxacin (CIPRO) tablet 500 mg  Status:  Discontinued     500 mg Oral 2 times daily 01/29/16 1700 01/30/16 1829   01/22/16 0900  piperacillin-tazobactam (ZOSYN) IVPB 3.375 g  Status:  Discontinued     3.375 g 12.5 mL/hr over 240 Minutes Intravenous Every 8 hours 01/22/16 0840 01/29/16 1700   01/21/16 1830  ciprofloxacin (CIPRO) IVPB 400 mg  Status:  Discontinued     400 mg 200 mL/hr over 60 Minutes Intravenous Every 12 hours 01/21/16 1744 01/22/16 0840   01/21/16 1830  metroNIDAZOLE (FLAGYL) IVPB 500 mg  Status:  Discontinued     500 mg 100 mL/hr over 60 Minutes Intravenous Every 6 hours 01/21/16 1744 01/22/16 0840      Subjective:    no fever, report less pain, still has diarrhea  Objective:   Vitals:   02/02/16 1434 02/02/16 2144 02/03/16 0430 02/03/16 0901  BP: 106/61 110/62 (!) 113/54   Pulse: 64 70 70   Resp: 18 16 16    Temp: 97.5 F (36.4 C) 98 F (36.7 C) 97.6 F (36.4 C) 98.5 F (36.9  C)  TempSrc: Oral Oral Oral Oral  SpO2: 96% 99% 100%   Weight:      Height:        Intake/Output Summary (Last 24 hours) at 02/03/16 1019 Last data filed at 02/03/16 0545  Gross per 24 hour  Intake             1160 ml  Output                0 ml  Net             1160 ml   Filed Weights   01/07/16 1136  Weight: 72.6 kg (160 lb)    Exam  General:  Awake, in no acute distress, hard of hearing  HEENT: Pupils equal round reactive to light, dentition fair  Cardiovascular: Regular rate, S1-S2  Respiratory: Normal chest rise, no crackles   Abdomen: Positive bowel sounds, generally tender  Extremities: No cyanosis, no joint deformities  Neuro: Cranial nerves II through XII grossly intact, sensation intact  Psych: Normal affect, no auditory hallucinations  Skin: No pallor, no rashes   Data Reviewed: I have personally reviewed following labs and imaging studies  CBC:  Recent Labs Lab 01/30/16 0612 01/31/16 0753 02/01/16 0553 02/02/16 0618 02/03/16 0643  WBC 5.8 7.1 4.2 5.0 8.5  NEUTROABS 4.3 5.6 2.7 3.3 6.3  HGB 10.6* 10.4* 9.3* 9.0* 8.9*  HCT 32.4* 31.3* 28.0* 27.6* 27.8*  MCV 90.0 89.2 89.5 90.8 91.7  PLT 133* 155 150 165 700   Basic Metabolic Panel:  Recent Labs Lab 01/30/16 0612 01/31/16 0753 02/01/16 0553 02/02/16 0618 02/03/16 0643  NA 134* 130* 135 134* 135  K 3.8 3.3* 3.2* 3.7 3.6  CL 101 101 105 107 103  CO2 28 25 25 25 26   GLUCOSE 76 87 98 86 81  BUN 7 <5* <5* 6 <5*  CREATININE 0.51 0.39* 0.40* 0.35* 0.33*  CALCIUM 8.1* 7.5* 7.7* 7.8* 8.0*  MG  --  1.8 1.8 1.8  --    GFR: Estimated Creatinine Clearance: 62.9 mL/min (by C-G formula based on SCr of 0.33 mg/dL (L)). Liver Function Tests: No results for input(s): AST, ALT, ALKPHOS, BILITOT, PROT, ALBUMIN in the last 168 hours. No results for input(s): LIPASE, AMYLASE in the last 168 hours. No results for input(s): AMMONIA in the last 168 hours. Coagulation Profile: No results for  input(s): INR, PROTIME in the last 168 hours. Cardiac Enzymes: No results for input(s): CKTOTAL, CKMB, CKMBINDEX, TROPONINI in the last 168 hours. BNP (last 3 results) No results for input(s): PROBNP in the last 8760 hours. HbA1C: No results for input(s): HGBA1C in the last 72 hours. CBG: No results for input(s): GLUCAP in the last 168 hours. Lipid Profile: No results for input(s): CHOL, HDL, LDLCALC, TRIG, CHOLHDL, LDLDIRECT in the last 72 hours. Thyroid Function Tests: No results for input(s): TSH, T4TOTAL, FREET4, T3FREE, THYROIDAB in the last 72 hours. Anemia Panel: No results for input(s): VITAMINB12, FOLATE, FERRITIN, TIBC, IRON, RETICCTPCT in the last 72 hours. Urine analysis:    Component Value Date/Time   COLORURINE YELLOW 01/30/2016 [place] 01/30/2016 1653   LABSPEC 1.008 01/30/2016 1653   PHURINE 6.5 01/30/2016 1653   GLUCOSEU NEGATIVE 01/30/2016 1653   HGBUR MODERATE (A) 01/30/2016 1653   HGBUR small 12/13/2009 0904   BILIRUBINUR NEGATIVE 01/30/2016 1653   BILIRUBINUR ne 01/30/2015 1543   KETONESUR NEGATIVE 01/30/2016 1653   PROTEINUR NEGATIVE 01/30/2016 1653   UROBILINOGEN 0.2 01/30/2015 1543   UROBILINOGEN 0.2 09/25/2013 1350   NITRITE NEGATIVE 01/30/2016 1653   LEUKOCYTESUR TRACE (A) 01/30/2016 1653   Sepsis Labs: @LABRCNTIP (procalcitonin:4,lacticidven:4)  ) Recent Results (from the past 240 hour(s))  Culture, blood (routine x 2)     Status: None (Preliminary result)   Collection Time: 01/30/16  2:47 PM  Result Value Ref Range Status   Specimen Description BLOOD BLOOD RIGHT ARM  Final   Special Requests BOTTLES DRAWN AEROBIC AND ANAEROBIC 5 CC  Final   Culture   Final    NO GROWTH 3 DAYS Performed at [facility]    Report Status PENDING  Incomplete  Culture, blood (routine x 2)     Status: None (Preliminary result)   Collection Time: 01/30/16  2:47 PM  Result Value Ref Range Status   Specimen Description BLOOD BLOOD RIGHT  ARM  Final   Special Requests BOTTLES DRAWN AEROBIC AND ANAEROBIC 5 CC  Final   Culture   Final    NO GROWTH 3 DAYS Performed at [facility]    Report Status PENDING  Incomplete  Urine culture     Status: None   Collection Time: 01/30/16  4:53 PM  Result Value Ref Range Status   Specimen Description URINE, RANDOM  Final   Special Requests NONE  Final   Culture NO GROWTH Performed at [facility]   Final   Report Status 01/31/2016 FINAL  Final      Radiology Studies: No results found.   Scheduled Meds: . ciprofloxacin  500 mg Oral BID  . dicyclomine  10 mg Oral TID AC & HS  . fluconazole  200 mg Oral Once  . FLUoxetine  40 mg Oral Daily  . heparin subcutaneous  5,000 Units Subcutaneous Q12H  . methylPREDNISolone (SOLU-MEDROL) injection  40 mg Intravenous Daily  . metroNIDAZOLE  500 mg Oral Q12H  . midazolam  2 mg Intravenous Once  . oxymorphone  10 mg Oral QAC breakfast  . oxymorphone  10 mg Oral QHS   Continuous Infusions: . sodium chloride 100 mL/hr at 02/03/16 0545     LOS: 26 days    Ariell Gunnels  MD. PhD on 02/03/2016 at 10:19 AM  Between 7am to 7pm - Pager - [phone]  After 7pm go to [email or web address] - password [email or web address]  And look for the night coverage person covering for me after hours  [facility] Office  [phone]

## 2016-02-04 LAB — CBC WITH DIFFERENTIAL/PLATELET
Basophils Absolute: 0 10*3/uL (ref 0.0–0.1)
Basophils Relative: 0 %
EOS ABS: 0 10*3/uL (ref 0.0–0.7)
EOS PCT: 0 %
HCT: 27.8 % — ABNORMAL LOW (ref 36.0–46.0)
HEMOGLOBIN: 8.8 g/dL — AB (ref 12.0–15.0)
LYMPHS ABS: 1.4 10*3/uL (ref 0.7–4.0)
Lymphocytes Relative: 19 %
MCH: 29 pg (ref 26.0–34.0)
MCHC: 31.7 g/dL (ref 30.0–36.0)
MCV: 91.7 fL (ref 78.0–100.0)
MONOS PCT: 10 %
Monocytes Absolute: 0.7 10*3/uL (ref 0.1–1.0)
NEUTROS PCT: 71 %
Neutro Abs: 5.2 10*3/uL (ref 1.7–7.7)
Platelets: 176 10*3/uL (ref 150–400)
RBC: 3.03 MIL/uL — ABNORMAL LOW (ref 3.87–5.11)
RDW: 14.8 % (ref 11.5–15.5)
WBC: 7.3 10*3/uL (ref 4.0–10.5)

## 2016-02-04 LAB — BASIC METABOLIC PANEL
Anion gap: 7 (ref 5–15)
CHLORIDE: 103 mmol/L (ref 101–111)
CO2: 28 mmol/L (ref 22–32)
CREATININE: 0.44 mg/dL (ref 0.44–1.00)
Calcium: 8.2 mg/dL — ABNORMAL LOW (ref 8.9–10.3)
GFR calc Af Amer: >60 mL/min (ref >60)
GFR calc non Af Amer: >60 mL/min (ref >60)
GLUCOSE: 83 mg/dL (ref 65–99)
Potassium: 3.6 mmol/L (ref 3.5–5.1)
SODIUM: 138 mmol/L (ref 135–145)

## 2016-02-04 LAB — CULTURE, BLOOD (ROUTINE X 2)
CULTURE: NO GROWTH
Culture: NO GROWTH

## 2016-02-04 LAB — MAGNESIUM: MAGNESIUM: 1.8 mg/dL (ref 1.7–2.4)

## 2016-02-04 LAB — C-REACTIVE PROTEIN: CRP: 5.5 mg/dL — ABNORMAL HIGH (ref <1.0)

## 2016-02-04 NOTE — Progress Notes (Signed)
PROGRESS NOTE    Sara Barnes  DJT:[id number] DOB: 1940/12/21 DOA: 01/07/2016 PCP: Sara Held, DO   Chief Complaint  Patient presents with  . Rectal Bleeding    SINCE FRIDAY     Brief Narrative:  HPI on 01/07/2016 by Dr. Gilman Schmidt Barnes a 75 y.o.femalewith medical history significant of ulcerative colitis versus Crohn's disease, believed to be UC eventually,otherwise healthy, presents to the emergency room with chief complaint of severe abdominal pain for the past several days. She was recently hospitalized and discharged home about 9 days prior to admission. She was here in the hospital with similar complaints, placed on steroids, and tells me today that she improved some and really wanted to go home. At home, she has had ongoing symptoms, never really well achieved full remission, and over the last few days she has felt extremely unwell. She has crampy abdominal pain, nausea, poor appetite, barely able to keep down some Gatorade, and has been having significant amounts of bloody bowel movements as well. She has no fever or chills. She has no chest pain or shortness of breath. She denies any palpitations. She has no lightheadedness or dizziness. She feels generalized weakness. In the emergency room her vital signs are stable, her blood work is essentially unremarkable. Gastroenterology was consulted by EDP and have evaluated patient, and TRH was asked for admission  Interim history Gastroenterology consultation appreciated. Patient underwent flexible sigmoidoscopy. CMV was negative and she was started on Remicade. She was initially doing better and tolerating some diet, GI following and since still having a lot of pain and frequent BMs will be dosed with another Remicaide infusion on  9/17. 9/21 pain and bloody clots worsened overnight. CT abd/pelvis showed perirectal abscess- General surgery Was consulted. Patient underwent I&D on 9/26. Steroids were decreased per  GI recommendations. Patient received dose of amikacin on 01/28/2016. Clinically continuing to improve. Patient is working with physical therapy  Assessment & Plan   Abdominal pain/diarrhea secondary to Ulcerative colitis flare with rectal bleeding/ Perianal abscess -Patient is s/p flexible sigmoidoscopy on 01/08/16, biopsy with findings of active erosive ulcer. No CMV found. Has been given Remicade on 01/09/2016, 01/13/16, and 01/28/2016.Patient is status post I&D of abscess -c diff test negative x2, she was treated with zosynx 1week ,then oral cipro and flagyl, however developed fever on 10/4, abx broadened to imipenem, -.IV Solu-Medrol continued at 15 mg every 12 hours, which will be continued per gastroenterology -repeat blood culture /cxr/ua on 10/4 unremarkable, repeat CT ab/pel on 10/4 Ct shows abscess much improved with no residual fluid collection , drain culture multiple organisms, none predominant -will have general surgery and GI to guide on steroid/abx/remicade use/diverting colostomy  Gastroenterology and surgery input appreciated  Acute blood loss anemia/Symptomatic anemia -Likely secondary to the above, hgb 7.6 on admission -s/p prbc transfusion  On 9/25 and 9/26 hgb has been stable since  Depression/Anxiety -Prozac has been continued and patient is tolerating  Debility -PT/OT has been consulted -Recommendations have been made for skilled nursing facility  Thrombocytopenia -Lovenox was Barnes on 01/26/2016 secondary to thrombocytopenia below 1000 -Had started SCDs for DVT prophylaxis -Platelets have improved 102,000 -Continue Lovenox as tolerated    Hypokalemia: replace k  b12 deficiency: b12 Im injection x1 on 10/6.  DVT Prophylaxis   heparin  Code Status: Full  Family Communication: Patient in room,   Disposition Plan:  anticipate skilled nursing facility eventually  Consultants Gastroenterology  General surgery  Procedures  Flexible  sigmoidoscopy Incision and drainage. No abscess on 01/22/2016 prbc transfusion   Antibiotics   Anti-infectives    Start     Dose/Rate Route Frequency Ordered Stop   02/02/16 1100  metroNIDAZOLE (FLAGYL) tablet 500 mg     500 mg Oral Every 12 hours 02/02/16 1024     02/02/16 1100  ciprofloxacin (CIPRO) tablet 500 mg     500 mg Oral 2 times daily 02/02/16 1024     01/31/16 1400  fluconazole (DIFLUCAN) tablet 200 mg     200 mg Oral  Once 01/31/16 1204     01/31/16 0000  imipenem-cilastatin (PRIMAXIN) 500 mg in sodium chloride 0.9 % 100 mL IVPB  Status:  Discontinued     500 mg 200 mL/hr over 30 Minutes Intravenous Every 6 hours 01/30/16 1840 01/31/16 0957   01/30/16 1900  imipenem-cilastatin (PRIMAXIN) 500 mg in sodium chloride 0.9 % 100 mL IVPB     500 mg 200 mL/hr over 30 Minutes Intravenous STAT 01/30/16 1840 01/30/16 1956   01/29/16 2200  metroNIDAZOLE (FLAGYL) tablet 500 mg  Status:  Discontinued     500 mg Oral Every 8 hours 01/29/16 1700 01/30/16 1829   01/29/16 2000  ciprofloxacin (CIPRO) tablet 500 mg  Status:  Discontinued     500 mg Oral 2 times daily 01/29/16 1700 01/30/16 1829   01/22/16 0900  piperacillin-tazobactam (ZOSYN) IVPB 3.375 g  Status:  Discontinued     3.375 g 12.5 mL/hr over 240 Minutes Intravenous Every 8 hours 01/22/16 0840 01/29/16 1700   01/21/16 1830  ciprofloxacin (CIPRO) IVPB 400 mg  Status:  Discontinued     400 mg 200 mL/hr over 60 Minutes Intravenous Every 12 hours 01/21/16 1744 01/22/16 0840   01/21/16 1830  metroNIDAZOLE (FLAGYL) IVPB 500 mg  Status:  Discontinued     500 mg 100 mL/hr over 60 Minutes Intravenous Every 6 hours 01/21/16 1744 01/22/16 0840      Subjective:    no fever, report less pain, still has diarrhea, she is sitting  And having lunch, sister in room  Objective:   Vitals:   02/03/16 0901 02/03/16 1455 02/03/16 2130 02/04/16 0457  BP:  (!) 109/49 117/71 (!) 109/58  Pulse:  73 (!) 58 66  Resp:  18 18 17   Temp: 98.5 F  (36.9 C) 98.4 F (36.9 C) 99.3 F (37.4 C) 97.8 F (36.6 C)  TempSrc: Oral Oral Oral Oral  SpO2:  98% 98% 99%  Weight:      Height:        Intake/Output Summary (Last 24 hours) at 02/04/16 0712 Last data filed at 02/03/16 1700  Gross per 24 hour  Intake          1651.67 ml  Output                0 ml  Net          1651.67 ml   Filed Weights   01/07/16 1136  Weight: 72.6 kg (160 lb)    Exam  General:  Awake, in no acute distress, hard of hearing  HEENT: Pupils equal round reactive to light, dentition fair  Cardiovascular: Regular rate, S1-S2  Respiratory: Normal chest rise, no crackles   Abdomen: Positive bowel sounds, generally tender  Extremities: No cyanosis, no joint deformities  Neuro: Cranial nerves II through XII grossly intact, sensation intact  Psych: Normal affect, no auditory hallucinations  Skin: No pallor, no rashes   Data Reviewed: I have personally reviewed  following labs and imaging studies  CBC:  Recent Labs Lab 01/31/16 0753 02/01/16 0553 02/02/16 0618 02/03/16 0643 02/04/16 0519  WBC 7.1 4.2 5.0 8.5 7.3  NEUTROABS 5.6 2.7 3.3 6.3 5.2  HGB 10.4* 9.3* 9.0* 8.9* 8.8*  HCT 31.3* 28.0* 27.6* 27.8* 27.8*  MCV 89.2 89.5 90.8 91.7 91.7  PLT 155 150 165 188 720   Basic Metabolic Panel:  Recent Labs Lab 01/31/16 0753 02/01/16 0553 02/02/16 0618 02/03/16 0643 02/04/16 0519  NA 130* 135 134* 135 138  K 3.3* 3.2* 3.7 3.6 3.6  CL 101 105 107 103 103  CO2 25 25 25 26 28   GLUCOSE 87 98 86 81 83  BUN <5* <5* 6 <5* <5*  CREATININE 0.39* 0.40* 0.35* 0.33* 0.44  CALCIUM 7.5* 7.7* 7.8* 8.0* 8.2*  MG 1.8 1.8 1.8  --  1.8   GFR: Estimated Creatinine Clearance: 62.9 mL/min (by C-G formula based on SCr of 0.44 mg/dL). Liver Function Tests: No results for input(s): AST, ALT, ALKPHOS, BILITOT, PROT, ALBUMIN in the last 168 hours. No results for input(s): LIPASE, AMYLASE in the last 168 hours. No results for input(s): AMMONIA in the last 168  hours. Coagulation Profile: No results for input(s): INR, PROTIME in the last 168 hours. Cardiac Enzymes: No results for input(s): CKTOTAL, CKMB, CKMBINDEX, TROPONINI in the last 168 hours. BNP (last 3 results) No results for input(s): PROBNP in the last 8760 hours. HbA1C: No results for input(s): HGBA1C in the last 72 hours. CBG: No results for input(s): GLUCAP in the last 168 hours. Lipid Profile: No results for input(s): CHOL, HDL, LDLCALC, TRIG, CHOLHDL, LDLDIRECT in the last 72 hours. Thyroid Function Tests: No results for input(s): TSH, T4TOTAL, FREET4, T3FREE, THYROIDAB in the last 72 hours. Anemia Panel: No results for input(s): VITAMINB12, FOLATE, FERRITIN, TIBC, IRON, RETICCTPCT in the last 72 hours. Urine analysis:    Component Value Date/Time   COLORURINE YELLOW 01/30/2016 [place] 01/30/2016 1653   LABSPEC 1.008 01/30/2016 1653   PHURINE 6.5 01/30/2016 1653   GLUCOSEU NEGATIVE 01/30/2016 1653   HGBUR MODERATE (A) 01/30/2016 1653   HGBUR small 12/13/2009 0904   BILIRUBINUR NEGATIVE 01/30/2016 1653   BILIRUBINUR ne 01/30/2015 1543   KETONESUR NEGATIVE 01/30/2016 1653   PROTEINUR NEGATIVE 01/30/2016 1653   UROBILINOGEN 0.2 01/30/2015 1543   UROBILINOGEN 0.2 09/25/2013 1350   NITRITE NEGATIVE 01/30/2016 1653   LEUKOCYTESUR TRACE (A) 01/30/2016 1653   Sepsis Labs: @LABRCNTIP (procalcitonin:4,lacticidven:4)  ) Recent Results (from the past 240 hour(s))  Culture, blood (routine x 2)     Status: None (Preliminary result)   Collection Time: 01/30/16  2:47 PM  Result Value Ref Range Status   Specimen Description BLOOD BLOOD RIGHT ARM  Final   Special Requests BOTTLES DRAWN AEROBIC AND ANAEROBIC 5 CC  Final   Culture   Final    NO GROWTH 4 DAYS Performed at [facility]    Report Status PENDING  Incomplete  Culture, blood (routine x 2)     Status: None (Preliminary result)   Collection Time: 01/30/16  2:47 PM  Result Value Ref Range Status    Specimen Description BLOOD BLOOD RIGHT ARM  Final   Special Requests BOTTLES DRAWN AEROBIC AND ANAEROBIC 5 CC  Final   Culture   Final    NO GROWTH 4 DAYS Performed at [facility]    Report Status PENDING  Incomplete  Urine culture     Status: None   Collection  Time: 01/30/16  4:53 PM  Result Value Ref Range Status   Specimen Description URINE, RANDOM  Final   Special Requests NONE  Final   Culture NO GROWTH Performed at [facility]   Final   Report Status 01/31/2016 FINAL  Final      Radiology Studies: No results found.   Scheduled Meds: . ciprofloxacin  500 mg Oral BID  . dicyclomine  10 mg Oral TID AC & HS  . fluconazole  200 mg Oral Once  . FLUoxetine  40 mg Oral Daily  . heparin subcutaneous  5,000 Units Subcutaneous Q12H  . methylPREDNISolone (SOLU-MEDROL) injection  40 mg Intravenous Daily  . metroNIDAZOLE  500 mg Oral Q12H  . midazolam  2 mg Intravenous Once  . oxymorphone  10 mg Oral QAC breakfast  . oxymorphone  10 mg Oral QHS   Continuous Infusions: . sodium chloride 100 mL/hr at 02/04/16 0432     LOS: 43 days    Tynleigh Birt  MD. PhD on 02/04/2016 at 9:04 AM  Between 7am to 7pm - Pager - [phone]  After 7pm go to [email or web address] - password [email or web address]  And look for the night coverage person covering for me after hours  [facility] Office  [phone]

## 2016-02-04 NOTE — Progress Notes (Signed)
S: No acute changes. Feels better and thinks the drainage is going down.   Vitals, labs, intake/output, and orders reviewed at this time.  Gen: A&Ox3, no distress  H&N: EOMI, atraumatic, neck supple Chest: unlabored respirations, RRR Abd: soft, nontender, nondistended Rectum: setons in place, stool and gas actively draining through fistulae. Irritation of surrounding skin. Ext: warm, no edema Neuro: grossly normal  Lines/tubes/drains: PIV  A/P:  75yo woman with Crohn's s/p I&D horseshoe abscess with seton on 01/22/16 [dr. Gerkin] now with significant drainage of stool around seton. She likely will ultimately require a diverting ostomy but she states that this is "on the back burner" for now. Continue local wound care/keep the area as clean as we can, barrier cream to minimize skin irritation, will continue to discuss with her the benefit of diverting ostomy.    [name], MD [facility], [place] Pager [phone]

## 2016-02-04 NOTE — Progress Notes (Signed)
[facility] Progress Note    Assessment / Plan: *75 year old female with Crohn's colitis complicated by perirectal abscess and fistula status post incision and drainage with packing of complex perirectal abscess, packing was removed next day and currently has a drain in place.  She received second induction dose of Remicade on 01/28/2016, may need IV methyl prednisone 60 mg x 1 dose with next induction dose of Remicade for possible reaction to the last infusion.  Patient reports improvement in drainage from the fistula and also rectal pain, wants to hold off diverting colostomy if possible.  Given significant improvement in symptoms and CRP trending is down, will hold off flexible sigmoidoscopy at this point.  Cipro and Flagyl x 7-10 days.  Is still on IV solumedrol; ? Ok to switch to Prednisone 71m and will taper 5 mg every week as Dr. NSilverio Decamphad mentioned.  Continue Opana and IV Dilaudid as needed.  ? Need to follow daily CRP.   LOS: 27 days   ZEHR, JESSICA D.  02/04/2016, 8:48 AM  Pager number [phone]   [facility] Attending   I have taken an interval history, reviewed the chart and examined the patient. I agree with the Advanced Practitioner's note, impression and recommendations.   I have also seen and examined the patient - complicated situation. She wants to see what happens over next few days and decide about diverting colostomy vs not.  1) I think diverting colostomy is appropriate recommendation, doubt medical Tx will lead resolution of her problem efficiently if at all. 2) Leave steroids where they are for now - if she goes for the diverting colostomy then would see about no steroids. 3) Issue of ? Reaction to Remicade remains - concerned about re-treatment with that (due in 3.5 weeks) and if we did would definitely preTx w/ steroids but also need to consider different agent.   CGatha Mayer MD, [facility] [phone] (pager) [phone] 5 PM, weekends and holidays  02/04/2016 12:04 PM    Subjective:  Reports that she continues to feel improved over the past 3-4 days.  Is interested in rehab upon discharge.  Says that she is having some form to stools at times although continues to have drainage.  Abdominal pain is better.  Still has blood with stools at times.  Objective:  Vital signs in last 24 hours: Temp:  [97.8 F (36.6 C)-99.3 F (37.4 C)] 97.8 F (36.6 C) (10/09 0457) Pulse Rate:  [58-73] 66 (10/09 0457) Resp:  [17-18] 17 (10/09 0457) BP: (109-117)/(49-71) 109/58 (10/09 0457) SpO2:  [98 %-99 %] 99 % (10/09 0457) Last BM Date: 02/03/16 General:  Alert, Well-developed, in NAD Heart:  Regular rate and rhythm; no murmurs Pulm:  CTAB.  No W/R/R. Abdomen:  Soft, non-distended.  BS present.  Non-tender.   Extremities:  Without edema. Neurologic:  Alert and oriented x 4;  grossly normal neurologically. Psych:  Alert and cooperative. Normal mood and affect.  Lab Results:  Recent Labs  02/02/16 0618 02/03/16 0643 02/04/16 0519  WBC 5.0 8.5 7.3  HGB 9.0* 8.9* 8.8*  HCT 27.6* 27.8* 27.8*  PLT 165 188 176   BMET  Recent Labs  02/02/16 0618 02/03/16 0643 02/04/16 0519  NA 134* 135 138  K 3.7 3.6 3.6  CL 107 103 103  CO2 25 26 28   GLUCOSE 86 81 83  BUN 6 <5* <5*  CREATININE 0.35* 0.33* 0.44  CALCIUM 7.8* 8.0* 8.2*

## 2016-02-05 LAB — CBC WITH DIFFERENTIAL/PLATELET
BASOS ABS: 0 10*3/uL (ref 0.0–0.1)
BASOS PCT: 0 %
EOS ABS: 0 10*3/uL (ref 0.0–0.7)
Eosinophils Relative: 0 %
HEMATOCRIT: 26 % — AB (ref 36.0–46.0)
Hemoglobin: 8.3 g/dL — ABNORMAL LOW (ref 12.0–15.0)
Lymphocytes Relative: 25 %
Lymphs Abs: 1.4 10*3/uL (ref 0.7–4.0)
MCH: 29.3 pg (ref 26.0–34.0)
MCHC: 31.9 g/dL (ref 30.0–36.0)
MCV: 91.9 fL (ref 78.0–100.0)
MONO ABS: 0.6 10*3/uL (ref 0.1–1.0)
Monocytes Relative: 10 %
Neutro Abs: 3.7 10*3/uL (ref 1.7–7.7)
Neutrophils Relative %: 65 %
PLATELETS: 189 10*3/uL (ref 150–400)
RBC: 2.83 MIL/uL — ABNORMAL LOW (ref 3.87–5.11)
RDW: 15.1 % (ref 11.5–15.5)
WBC: 5.7 10*3/uL (ref 4.0–10.5)

## 2016-02-05 LAB — C-REACTIVE PROTEIN: CRP: 5.3 mg/dL — AB (ref <1.0)

## 2016-02-05 NOTE — Progress Notes (Addendum)
PROGRESS NOTE    Sara Barnes  PRF:[id number] DOB: 04/29/1940 DOA: 01/07/2016 PCP: Ann Held, DO   Chief Complaint  Patient presents with  . Rectal Bleeding    SINCE FRIDAY     Brief Narrative:  HPI on 01/07/2016 by Dr. Gilman Schmidt [name] a 75 y.o.femalewith medical history significant of ulcerative colitis versus Crohn's disease, believed to be UC eventually,otherwise healthy, presents to the emergency room with chief complaint of severe abdominal pain for the past several days. She was recently hospitalized and discharged home about 9 days prior to admission. She was here in the hospital with similar complaints, placed on steroids, and tells me today that she improved some and really wanted to go home. At home, she has had ongoing symptoms, never really well achieved full remission, and over the last few days she has felt extremely unwell. She has crampy abdominal pain, nausea, poor appetite, barely able to keep down some Gatorade, and has been having significant amounts of bloody bowel movements as well. She has no fever or chills. She has no chest pain or shortness of breath. She denies any palpitations. She has no lightheadedness or dizziness. She feels generalized weakness. In the emergency room her vital signs are stable, her blood work is essentially unremarkable. Gastroenterology was consulted by EDP and have evaluated patient, and TRH was asked for admission  Interim history Gastroenterology consultation appreciated. Patient underwent flexible sigmoidoscopy. CMV was negative and she was started on Remicade. She was initially doing better and tolerating some diet, GI following and since still having a lot of pain and frequent BMs will be dosed with another Remicaide infusion on  9/17. 9/21 pain and bloody clots worsened overnight. CT abd/pelvis showed perirectal abscess- General surgery Was consulted. Patient underwent I&D on 9/26. Steroids were decreased per  GI recommendations. Patient received dose of amikacin on 01/28/2016. Clinically continuing to improve. Patient is working with physical therapy  UC vs crohn's flare, perirectal abscess s/p drain,  S/p remicade, on Solu-Medrol.  General surgery and GI following, eventually  skilled nursing facility   Assessment & Plan   Abdominal pain/diarrhea secondary to Ulcerative colitis flare with rectal bleeding/ Perianal abscess -Patient is s/p flexible sigmoidoscopy on 01/08/16, biopsy with findings of active erosive ulcer. No CMV found. Has been given Remicade on 01/09/2016, 01/13/16, and 01/28/2016.Patient is status post I&D of abscess -c diff test negative x2, she was treated with zosynx 1week ,then oral cipro and flagyl, however developed fever on 10/4, abx broadened to imipenem, -.IV Solu-Medrol continued at 15 mg every 12 hours, which will be continued per gastroenterology -repeat blood culture /cxr/ua on 10/4 unremarkable, repeat CT ab/pel on 10/4 Ct shows abscess much improved with no residual fluid collection , drain culture multiple organisms, none predominant -will have general surgery and GI to guide on steroid/abx/remicade use/diverting colostomy  Gastroenterology and surgery input appreciated  Acute blood loss anemia/Symptomatic anemia -Likely secondary to the above, hgb 7.6 on admission -s/p prbc transfusion  On 9/25 and 9/26 hgb has been stable since  Depression/Anxiety -Prozac has been continued and patient is tolerating  Debility -PT/OT has been consulted -Recommendations have been made for skilled nursing facility  Thrombocytopenia -Lovenox was held on 01/26/2016 secondary to thrombocytopenia below 1000 -Had started SCDs for DVT prophylaxis -Platelets have improved 102,000 -Continue Lovenox as tolerated    Hypokalemia: replace k  b12 deficiency: b12 Im injection x1 on 10/6.  DVT Prophylaxis   heparin  Code Status: Full  Family Communication: Patient in room,    Disposition Plan:  anticipate skilled nursing facility eventually  Consultants Gastroenterology  General surgery  Procedures  Flexible sigmoidoscopy Incision and drainage. No abscess on 01/22/2016 prbc transfusion   Antibiotics   Anti-infectives    Start     Dose/Rate Route Frequency Ordered Stop   02/02/16 1100  metroNIDAZOLE (FLAGYL) tablet 500 mg     500 mg Oral Every 12 hours 02/02/16 1024     02/02/16 1100  ciprofloxacin (CIPRO) tablet 500 mg     500 mg Oral 2 times daily 02/02/16 1024     01/31/16 1400  fluconazole (DIFLUCAN) tablet 200 mg     200 mg Oral  Once 01/31/16 1204     01/31/16 0000  imipenem-cilastatin (PRIMAXIN) 500 mg in sodium chloride 0.9 % 100 mL IVPB  Status:  Discontinued     500 mg 200 mL/hr over 30 Minutes Intravenous Every 6 hours 01/30/16 1840 01/31/16 0957   01/30/16 1900  imipenem-cilastatin (PRIMAXIN) 500 mg in sodium chloride 0.9 % 100 mL IVPB     500 mg 200 mL/hr over 30 Minutes Intravenous STAT 01/30/16 1840 01/30/16 1956   01/29/16 2200  metroNIDAZOLE (FLAGYL) tablet 500 mg  Status:  Discontinued     500 mg Oral Every 8 hours 01/29/16 1700 01/30/16 1829   01/29/16 2000  ciprofloxacin (CIPRO) tablet 500 mg  Status:  Discontinued     500 mg Oral 2 times daily 01/29/16 1700 01/30/16 1829   01/22/16 0900  piperacillin-tazobactam (ZOSYN) IVPB 3.375 g  Status:  Discontinued     3.375 g 12.5 mL/hr over 240 Minutes Intravenous Every 8 hours 01/22/16 0840 01/29/16 1700   01/21/16 1830  ciprofloxacin (CIPRO) IVPB 400 mg  Status:  Discontinued     400 mg 200 mL/hr over 60 Minutes Intravenous Every 12 hours 01/21/16 1744 01/22/16 0840   01/21/16 1830  metroNIDAZOLE (FLAGYL) IVPB 500 mg  Status:  Discontinued     500 mg 100 mL/hr over 60 Minutes Intravenous Every 6 hours 01/21/16 1744 01/22/16 0840      Subjective:    no fever, report less pain, still has diarrhea, she is sitting  And having lunch, sister in room  Objective:   Vitals:    02/03/16 2130 02/04/16 0457 02/04/16 2057 02/04/16 2209  BP: 117/71 (!) 109/58 (!) 119/49 (!) 146/62  Pulse: (!) 58 66 60 65  Resp: 18 17 17 17   Temp: 99.3 F (37.4 C) 97.8 F (36.6 C) 98 F (36.7 C) 98.1 F (36.7 C)  TempSrc: Oral Oral Oral Oral  SpO2: 98% 99% 100% 99%  Weight:      Height:        Intake/Output Summary (Last 24 hours) at 02/05/16 1151 Last data filed at 02/05/16 0900  Gross per 24 hour  Intake              240 ml  Output                0 ml  Net              240 ml   Filed Weights   01/07/16 1136  Weight: 72.6 kg (160 lb)    Exam  General:  Awake, in no acute distress, hard of hearing  HEENT: Pupils equal round reactive to light, dentition fair  Cardiovascular: Regular rate, S1-S2  Respiratory: Normal chest rise, no crackles   Abdomen: Positive bowel sounds, generally tender  Extremities: No cyanosis,  no joint deformities  Neuro: Cranial nerves II through XII grossly intact, sensation intact  Psych: Normal affect, no auditory hallucinations  Skin: No pallor, no rashes   Data Reviewed: I have personally reviewed following labs and imaging studies  CBC:  Recent Labs Lab 02/01/16 0553 02/02/16 0618 02/03/16 0643 02/04/16 0519 02/05/16 0523  WBC 4.2 5.0 8.5 7.3 5.7  NEUTROABS 2.7 3.3 6.3 5.2 3.7  HGB 9.3* 9.0* 8.9* 8.8* 8.3*  HCT 28.0* 27.6* 27.8* 27.8* 26.0*  MCV 89.5 90.8 91.7 91.7 91.9  PLT 150 165 188 176 347   Basic Metabolic Panel:  Recent Labs Lab 01/31/16 0753 02/01/16 0553 02/02/16 0618 02/03/16 0643 02/04/16 0519  NA 130* 135 134* 135 138  K 3.3* 3.2* 3.7 3.6 3.6  CL 101 105 107 103 103  CO2 25 25 25 26 28   GLUCOSE 87 98 86 81 83  BUN <5* <5* 6 <5* <5*  CREATININE 0.39* 0.40* 0.35* 0.33* 0.44  CALCIUM 7.5* 7.7* 7.8* 8.0* 8.2*  MG 1.8 1.8 1.8  --  1.8   GFR: Estimated Creatinine Clearance: 62.9 mL/min (by C-G formula based on SCr of 0.44 mg/dL). Liver Function Tests: No results for input(s): AST, ALT,  ALKPHOS, BILITOT, PROT, ALBUMIN in the last 168 hours. No results for input(s): LIPASE, AMYLASE in the last 168 hours. No results for input(s): AMMONIA in the last 168 hours. Coagulation Profile: No results for input(s): INR, PROTIME in the last 168 hours. Cardiac Enzymes: No results for input(s): CKTOTAL, CKMB, CKMBINDEX, TROPONINI in the last 168 hours. BNP (last 3 results) No results for input(s): PROBNP in the last 8760 hours. HbA1C: No results for input(s): HGBA1C in the last 72 hours. CBG: No results for input(s): GLUCAP in the last 168 hours. Lipid Profile: No results for input(s): CHOL, HDL, LDLCALC, TRIG, CHOLHDL, LDLDIRECT in the last 72 hours. Thyroid Function Tests: No results for input(s): TSH, T4TOTAL, FREET4, T3FREE, THYROIDAB in the last 72 hours. Anemia Panel: No results for input(s): VITAMINB12, FOLATE, FERRITIN, TIBC, IRON, RETICCTPCT in the last 72 hours. Urine analysis:    Component Value Date/Time   COLORURINE YELLOW 01/30/2016 [place] 01/30/2016 1653   LABSPEC 1.008 01/30/2016 1653   PHURINE 6.5 01/30/2016 1653   GLUCOSEU NEGATIVE 01/30/2016 1653   HGBUR MODERATE (A) 01/30/2016 1653   HGBUR small 12/13/2009 0904   BILIRUBINUR NEGATIVE 01/30/2016 1653   BILIRUBINUR ne 01/30/2015 1543   KETONESUR NEGATIVE 01/30/2016 1653   PROTEINUR NEGATIVE 01/30/2016 1653   UROBILINOGEN 0.2 01/30/2015 1543   UROBILINOGEN 0.2 09/25/2013 1350   NITRITE NEGATIVE 01/30/2016 1653   LEUKOCYTESUR TRACE (A) 01/30/2016 1653   Sepsis Labs: @LABRCNTIP (procalcitonin:4,lacticidven:4)  ) Recent Results (from the past 240 hour(s))  Culture, blood (routine x 2)     Status: None   Collection Time: 01/30/16  2:47 PM  Result Value Ref Range Status   Specimen Description BLOOD BLOOD RIGHT ARM  Final   Special Requests BOTTLES DRAWN AEROBIC AND ANAEROBIC 5 CC  Final   Culture   Final    NO GROWTH 5 DAYS Performed at [facility]    Report Status  02/04/2016 FINAL  Final  Culture, blood (routine x 2)     Status: None   Collection Time: 01/30/16  2:47 PM  Result Value Ref Range Status   Specimen Description BLOOD BLOOD RIGHT ARM  Final   Special Requests BOTTLES DRAWN AEROBIC AND ANAEROBIC 5 CC  Final   Culture   Final  NO GROWTH 5 DAYS Performed at [facility]    Report Status 02/04/2016 FINAL  Final  Urine culture     Status: None   Collection Time: 01/30/16  4:53 PM  Result Value Ref Range Status   Specimen Description URINE, RANDOM  Final   Special Requests NONE  Final   Culture NO GROWTH Performed at [facility]   Final   Report Status 01/31/2016 FINAL  Final      Radiology Studies: No results found.   Scheduled Meds: . ciprofloxacin  500 mg Oral BID  . dicyclomine  10 mg Oral TID AC & HS  . fluconazole  200 mg Oral Once  . FLUoxetine  40 mg Oral Daily  . heparin subcutaneous  5,000 Units Subcutaneous Q12H  . methylPREDNISolone (SOLU-MEDROL) injection  40 mg Intravenous Daily  . metroNIDAZOLE  500 mg Oral Q12H  . midazolam  2 mg Intravenous Once  . oxymorphone  10 mg Oral QAC breakfast  . oxymorphone  10 mg Oral QHS   Continuous Infusions: . sodium chloride 100 mL/hr at 02/05/16 0808     LOS: 50 days    Sara Aja  MD. PhD on 02/05/2016 at 11:51 AM  Between 7am to 7pm - Pager - [phone]  After 7pm go to [email or web address] - password [email or web address]  And look for the night coverage person covering for me after hours  [facility] Office  [phone]

## 2016-02-05 NOTE — Progress Notes (Signed)
Occupational Therapy Treatment Patient Details Name: Sara Barnes MRN: [id number] DOB: [birth year] Today's Date: 02/05/2016    History of present illness 75 y.o. female admitted with abdominal pain, bloody BMs. Dx of ulcerative colitis. Pt had recent hospitalization with similar symptoms, DCed home 12/29/15. PMH of breast cancer.    OT comments  Pt performed a shower this OT session  Follow Up Recommendations  SNF;Supervision/Assistance - 24 hour    Equipment Recommendations  None recommended by OT (defer to next venue)    Recommendations for Other Services      Precautions / Restrictions Precautions Precautions: Fall       Mobility Bed Mobility Overal bed mobility: Needs Assistance Bed Mobility: Sit to Supine       Sit to supine: Min assist   General bed mobility comments: assisted LEs  Transfers Overall transfer level: Needs assistance Equipment used: 1 person hand held assist   Sit to Stand: Min assist Stand pivot transfers: Min assist       General transfer comment: VCs hand placement, Min A to rise and steady        ADL Overall ADL's : Needs assistance/impaired                         Toilet Transfer: Minimal assistance;Ambulation;Comfort height toilet   Toileting- Clothing Manipulation and Hygiene: Sit to/from stand;Cueing for sequencing;Cueing for compensatory techniques;Moderate assistance   Tub/ Shower Transfer: Minimal assistance;3 in 1;Walk-in shower;Cueing for sequencing;Cueing for safety     General ADL Comments: Pt performed shower this day and sat on BSC with sitz bath.  Sitz bath changed several times during shower activity.  Pts bottom constantly leaking getting in and out of shower. Pt needed significant A with cleaning bottom      Vision                            Cognition   Behavior During Therapy: WFL for tasks assessed/performed Overall Cognitive Status: Within Functional Limits for tasks assessed                                     Pertinent Vitals/ Pain       Pain Score: 5  Pain Location: bottom Pain Descriptors / Indicators: Sore Pain Intervention(s): Monitored during session         Frequency  Min 2X/week        Progress Toward Goals  OT Goals(current goals can now be found in the care plan section)  Progress towards OT goals: Progressing toward goals     Plan Discharge plan remains appropriate       End of Session Equipment Utilized During Treatment: Rolling walker;Gait belt   Activity Tolerance Patient limited by fatigue;Patient limited by pain   Patient Left in bed;with call bell/phone within reach;with bed alarm set;with family/visitor present   Nurse Communication Mobility status (pt wants sign on door to limit visitors)        Time: 4163-8453 OT Time Calculation (min): 32 min  Charges: OT General Charges $OT Visit: 1 Procedure OT Treatments $Self Care/Home Management : 23-37 mins  Rayan Dyal D 02/05/2016, 11:45 AM

## 2016-02-05 NOTE — Progress Notes (Addendum)
PT Cancellation Note  Patient Details Name: ROSEMOND LYTTLE MRN: [id number] DOB: 11-20-1940   Cancelled Treatment:    Reason Eval/Treat Not Completed: Other (comment) (This is 5th unsuccessful attempt to see pt, prior cancellations were due to pain or pt already having walked. Today pt was walking in the hall with family. She has been walking daily with family as able and family plans to continue assisting her with this. Pt was observed ambulating with a RW and her sister, she is steady and had no LOB, she ambulated 200'.  PT signing off for now as family is assisting with ambulation. Pt/family agreed to this plan. )   Philomena Doheny 02/05/2016, 11:23 AM [phone]

## 2016-02-05 NOTE — Progress Notes (Signed)
S: No acute changes. Continues to feel that things are improving. Has no control of her bowels.   Vitals, labs, intake/output, and orders reviewed at this time.  Gen: A&Ox3, no distress  H&N: EOMI, atraumatic, neck supple Chest: unlabored respirations, RRR Abd: soft, nontender, nondistended Rectum: setons in place, stool and gas actively draining through fistulae. Irritation of surrounding skin. Ext: warm, no edema Neuro: grossly normal  Lines/tubes/drains: PIV  A/P:  75yo woman with Crohn's s/p I&D horseshoe abscess with seton on 01/22/16 [dr. Gerkin] now with significant drainage of stool around seton. She likely will ultimately require a diverting ostomy but remains uncommitted to this option. Continue local wound care/keep the area as clean as we can, barrier cream to minimize skin irritation, will continue to discuss with her the benefit of diverting ostomy.    [name], MD [facility], [place] Pager [phone]

## 2016-02-05 NOTE — Progress Notes (Signed)
Progress Note   Assessment / Plan:    Assessment: 56. 75 year old female with Crohn's colitis complicated by perirectal abscess and fistula status post incision and drainage with packing of complex perirectal abscess, packing was removed next day and currently has a drain in place.  She received second induction dose of Remicade on 01/28/2016, may need IV methyl prednisone 60 mg x 1 dose with next induction dose of Remicade for possible reaction to the last infusion.  Patient reports improvement in drainage from the fistula and also rectal pain, continues to want to hold off diverting colostomy if possible.  Given significant improvement in symptoms and CRP trending is down, will hold off flexible sigmoidoscopy at this point.      Plan: 1. Cipro/Flagyl for 7-10 days 2. Continue IV Solumedrol 3. Continue Opana and IV Dilaudid prn 4. At this time, continue to hold on surgery, per patient she is significantly improving. 5. Please await further recs from Dr. Carlean Purl  Thank you for your kind consult, we will continue to follow.   LOS: 28 days   Levin Erp  02/05/2016, 9:31 AM  Pager # [phone]    [facility] Attending   I have taken an interval history, reviewed the chart and  I agree with the Advanced Practitioner's note, impression and recommendations.  Gatha Mayer, MD, [facility] [phone] (pager) [phone] after 5 PM, weekends and holidays  02/05/2016 11:01 AM     Subjective  Chief Complaint: Crohn's colitis complicated by perirectal abscess and fistula status post incision and drainage with packing of complex perirectal abscess  Today, the patient continues to express that she feels much better over the past 2-3 days. She is able to get up and go to the bathroom and is having even some solid stools. She is also assisting with her own personal hygiene care. Today she spends a lot of time discussing with me the options that she has  available to her. She is still requesting that we delay surgery if at all possible. She is enthusiastic about the fact that she is starting to feel better. We did discuss her returning to rehabilitation instead of going straight home and she is okay with this idea. She does have a specific rehabilitation in mind that she will discuss with caseworkers. Patient also asked that if surgery is needed in the future that she is able to requests a specific surgeon for this. Currently she is very happy as everything seems to be getting dramatically better over the past few days. No new complaints.    Objective   Vital signs in last 24 hours: Temp:  [98 F (36.7 C)-98.1 F (36.7 C)] 98.1 F (36.7 C) (10/09 2209) Pulse Rate:  [60-65] 65 (10/09 2209) Resp:  [17] 17 (10/09 2209) BP: (119-146)/(49-62) 146/62 (10/09 2209) SpO2:  [99 %-100 %] 99 % (10/09 2209) Last BM Date: 02/05/16 General: Caucasian female in NAD Heart:  Regular rate and rhythm; no murmurs Lungs: Respirations even and unlabored, lungs CTA bilaterally Abdomen:  Soft, nontender and nondistended. Normal bowel sounds. Extremities:  Without edema. Neurologic:  Alert and oriented,  grossly normal neurologically. Psych:  Cooperative. Normal mood and affect.  Intake/Output from previous day: 10/09 0701 - 10/10 0700 In: 240 [P.O.:240] Out: -  Intake/Output this shift: Total I/O In: 120 [P.O.:120] Out: -   Lab Results:  Recent Labs  02/03/16 0643 02/04/16 0519 02/05/16 0523  WBC 8.5 7.3 5.7  HGB 8.9* 8.8* 8.3*  HCT  27.8* 27.8* 26.0*  PLT 188 176 189   BMET  Recent Labs  02/03/16 0643 02/04/16 0519  NA 135 138  K 3.6 3.6  CL 103 103  CO2 26 28  GLUCOSE 81 83  BUN <5* <5*  CREATININE 0.33* 0.44  CALCIUM 8.0* 8.2*   LFT No results for input(s): PROT, ALBUMIN, AST, ALT, ALKPHOS, BILITOT, BILIDIR, IBILI in the last 72 hours. PT/INR No results for input(s): LABPROT, INR in the last 72 hours.  Studies/Results: No  results found.

## 2016-02-06 LAB — CBC WITH DIFFERENTIAL/PLATELET
Basophils Absolute: 0 10*3/uL (ref 0.0–0.1)
Basophils Relative: 0 %
EOS PCT: 0 %
Eosinophils Absolute: 0 10*3/uL (ref 0.0–0.7)
HEMATOCRIT: 25.4 % — AB (ref 36.0–46.0)
HEMOGLOBIN: 8.1 g/dL — AB (ref 12.0–15.0)
LYMPHS ABS: 1.4 10*3/uL (ref 0.7–4.0)
LYMPHS PCT: 30 %
MCH: 29.2 pg (ref 26.0–34.0)
MCHC: 31.9 g/dL (ref 30.0–36.0)
MCV: 91.7 fL (ref 78.0–100.0)
MONOS PCT: 11 %
Monocytes Absolute: 0.5 10*3/uL (ref 0.1–1.0)
NEUTROS ABS: 2.9 10*3/uL (ref 1.7–7.7)
Neutrophils Relative %: 59 %
Platelets: 196 10*3/uL (ref 150–400)
RBC: 2.77 MIL/uL — AB (ref 3.87–5.11)
RDW: 15 % (ref 11.5–15.5)
WBC: 4.8 10*3/uL (ref 4.0–10.5)

## 2016-02-06 LAB — C-REACTIVE PROTEIN: CRP: 4.1 mg/dL — ABNORMAL HIGH (ref <1.0)

## 2016-02-06 MED ORDER — HYDROMORPHONE HCL 1 MG/ML IJ SOLN
1.0000 mg | INTRAMUSCULAR | Status: DC | PRN
  Administered 2016-02-06 – 2016-02-07 (×4): 1 mg via INTRAVENOUS
  Filled 2016-02-06 (×5): qty 1

## 2016-02-06 NOTE — Progress Notes (Signed)
PROGRESS NOTE    Sara Barnes  CLE:[id number] DOB: 07/31/1940 DOA: 01/07/2016 PCP: Ann Held, DO   Brief Narrative: HPI on 01/07/2016 by Dr. Gilman Schmidt [name] a 75 y.o.femalewith medical history significant of ulcerative colitis versus Crohn's disease, believed to be UC eventually,otherwise healthy, presents to the emergency room with chief complaint of severe abdominal pain for the past several days. She was recently hospitalized and discharged home about 9 days prior to admission. She was here in the hospital with similar complaints, placed on steroids, and tells me today that she improved some and really wanted to go home. At home, she has had ongoing symptoms, never really well achieved full remission, and over the last few days she has felt extremely unwell. She has crampy abdominal pain, nausea, poor appetite, barely able to keep down some Gatorade, and has been having significant amounts of bloody bowel movements as well. She has no fever or chills. She has no chest pain or shortness of breath. She denies any palpitations. She has no lightheadedness or dizziness. She feels generalized weakness. In the emergency room her vital signs are stable, her blood work is essentially unremarkable. Gastroenterology was consulted by EDP and have evaluated patient, and TRH was asked for admission  Interim history Gastroenterology consultation evaluated the patient. She underwent flexible sigmoidoscopy. CMV was negative and she was started on Remicade. She was initially doing better and tolerating some diet and since still having a lot of pain and frequent Bms she was dosed with another Remicaide infusion on 9/17. On 9/21 pain and bloody clots worsened overnight. CT abd/pelvis showed perirectal abscess- General surgery was consulted. Patient underwent I&D on 9/26. Steroids were decreased per GI recommendations. Patient received dose of amikacin on 01/28/2016. Clinically continuing  to improve. Patient is working with physical therapy  UC vs crohn's flare, perirectal abscess s/p drain,  S/p remicade, Currently on IV Solu-Medrol, cipro and flagyl General surgery and GI following for possible diverting colostomy and eventually like discharge to skilled nursing facility  Assessment & Plan:   # Abdominal pain/diarrhea secondary to Ulcerative colitis flare with rectal bleeding/ Perianal abscess -Patient is s/p flexible sigmoidoscopy on 01/08/16, biopsy with findings of active erosive ulcer. No CMV found. Has been given Remicade on 01/09/2016, 01/13/16, and 01/28/2016.Patient is status post I&D of abscess -c diff test negative x2, she was treated with zosynx 1week ,then oral cipro and flagyl, - Currently on ciprofloxacin, Flagyl and IV Solu-Medrol as per gastroenterologist. -Discussion ongoing for possible diverting colostomy by GI and general surgery team. Follow-up evaluation and plan.  # Acute blood loss anemia: Monitor hemoglobin level. Currently on heparin subcutaneous for DVT prophylaxis. Hemoglobin is slowly trending down to 8.1 today. Since patient is ambulating and concern of bleeding from the rectal site, I will discontinue heparin. -She received blood transfusion during this hospitalization.  #Pain management: Patient is currently on opana ER 10 mg bid, dilaudid prn and norco. I decreased the frequency of dilaudid prn. Try to taper narcotics as possible.  -pain seems adequately controlled.  # Physical deconditioning: PT, OT evaluation and treatment.  Continue current medical and supportive care. GI and general surgery consult appreciated.  DVT prophylaxis: SCD and early ambulation. Discontinue heparin because of bleeding and drop in hemoglobin. Code Status: Full code Family Communication: Patient's sister at bedside Disposition Plan: Likely discharge to rehabilitation versus SNF in 2-3 days depending on GI and surgery's plan    Consultants: GI and general  surgery  Antimicrobials: Cipro and Flagyl   Subjective: Patient was seen and examined at bedside. Patient reported back pain is stable with the current medication. Denied nausea, vomiting, chest pain, shortness of breath, dizziness or abdominal pain. Patient reported thinking about diverting colostomy.   Objective: Vitals:   02/05/16 1416 02/05/16 2035 02/06/16 0504 02/06/16 1411  BP: (!) 105/57 127/74 (!) 147/72 107/61  Pulse: 76 73 60 68  Resp: 17 16 16 16   Temp: 98.5 F (36.9 C) 99 F (37.2 C) 98.1 F (36.7 C) 98.1 F (36.7 C)  TempSrc: Oral Oral Oral Oral  SpO2: 97% 98% 97% 98%  Weight:      Height:        Intake/Output Summary (Last 24 hours) at 02/06/16 1538 Last data filed at 02/06/16 1300  Gross per 24 hour  Intake          2023.33 ml  Output                0 ml  Net          2023.33 ml   Filed Weights   01/07/16 1136  Weight: 72.6 kg (160 lb)    Examination:  General exam: Appears calm and comfortable  Respiratory system: Clear to auscultation. Respiratory effort normal. Cardiovascular system: S1 & S2 heard, RRR. No pedal edema. Gastrointestinal system: Abdomen is nondistended, soft and nontender.  Normal bowel sounds heard. Gauze around peri-rectal area, soaked with blood. Central nervous system: Alert and oriented. No focal neurological deficits. Extremities: Symmetric 5 x 5 power. Skin: No rashes, lesions or ulcers Psychiatry: Judgement and insight appear normal. Mood & affect appropriate.     Data Reviewed: I have personally reviewed following labs and imaging studies  CBC:  Recent Labs Lab 02/02/16 0618 02/03/16 0643 02/04/16 0519 02/05/16 0523 02/06/16 0541  WBC 5.0 8.5 7.3 5.7 4.8  NEUTROABS 3.3 6.3 5.2 3.7 2.9  HGB 9.0* 8.9* 8.8* 8.3* 8.1*  HCT 27.6* 27.8* 27.8* 26.0* 25.4*  MCV 90.8 91.7 91.7 91.9 91.7  PLT 165 188 176 189 650   Basic Metabolic Panel:  Recent Labs Lab 01/31/16 0753 02/01/16 0553 02/02/16 0618 02/03/16 0643  02/04/16 0519  NA 130* 135 134* 135 138  K 3.3* 3.2* 3.7 3.6 3.6  CL 101 105 107 103 103  CO2 25 25 25 26 28   GLUCOSE 87 98 86 81 83  BUN <5* <5* 6 <5* <5*  CREATININE 0.39* 0.40* 0.35* 0.33* 0.44  CALCIUM 7.5* 7.7* 7.8* 8.0* 8.2*  MG 1.8 1.8 1.8  --  1.8   GFR: Estimated Creatinine Clearance: 62.9 mL/min (by C-G formula based on SCr of 0.44 mg/dL). Liver Function Tests: No results for input(s): AST, ALT, ALKPHOS, BILITOT, PROT, ALBUMIN in the last 168 hours. No results for input(s): LIPASE, AMYLASE in the last 168 hours. No results for input(s): AMMONIA in the last 168 hours. Coagulation Profile: No results for input(s): INR, PROTIME in the last 168 hours. Cardiac Enzymes: No results for input(s): CKTOTAL, CKMB, CKMBINDEX, TROPONINI in the last 168 hours. BNP (last 3 results) No results for input(s): PROBNP in the last 8760 hours. HbA1C: No results for input(s): HGBA1C in the last 72 hours. CBG: No results for input(s): GLUCAP in the last 168 hours. Lipid Profile: No results for input(s): CHOL, HDL, LDLCALC, TRIG, CHOLHDL, LDLDIRECT in the last 72 hours. Thyroid Function Tests: No results for input(s): TSH, T4TOTAL, FREET4, T3FREE, THYROIDAB in the last 72 hours. Anemia Panel: No results for input(s): VITAMINB12,  FOLATE, FERRITIN, TIBC, IRON, RETICCTPCT in the last 72 hours. Sepsis Labs: No results for input(s): PROCALCITON, LATICACIDVEN in the last 168 hours.  Recent Results (from the past 240 hour(s))  Culture, blood (routine x 2)     Status: None   Collection Time: 01/30/16  2:47 PM  Result Value Ref Range Status   Specimen Description BLOOD BLOOD RIGHT ARM  Final   Special Requests BOTTLES DRAWN AEROBIC AND ANAEROBIC 5 CC  Final   Culture   Final    NO GROWTH 5 DAYS Performed at [facility]    Report Status 02/04/2016 FINAL  Final  Culture, blood (routine x 2)     Status: None   Collection Time: 01/30/16  2:47 PM  Result Value Ref Range Status    Specimen Description BLOOD BLOOD RIGHT ARM  Final   Special Requests BOTTLES DRAWN AEROBIC AND ANAEROBIC 5 CC  Final   Culture   Final    NO GROWTH 5 DAYS Performed at [facility]    Report Status 02/04/2016 FINAL  Final  Urine culture     Status: None   Collection Time: 01/30/16  4:53 PM  Result Value Ref Range Status   Specimen Description URINE, RANDOM  Final   Special Requests NONE  Final   Culture NO GROWTH Performed at [facility]   Final   Report Status 01/31/2016 FINAL  Final         Radiology Studies: No results found.      Scheduled Meds: . ciprofloxacin  500 mg Oral BID  . dicyclomine  10 mg Oral TID AC & HS  . fluconazole  200 mg Oral Once  . FLUoxetine  40 mg Oral Daily  . heparin subcutaneous  5,000 Units Subcutaneous Q12H  . methylPREDNISolone (SOLU-MEDROL) injection  40 mg Intravenous Daily  . metroNIDAZOLE  500 mg Oral Q12H  . midazolam  2 mg Intravenous Once  . oxymorphone  10 mg Oral QAC breakfast  . oxymorphone  10 mg Oral QHS   Continuous Infusions: . sodium chloride 100 mL/hr at 02/05/16 0808     LOS: 29 days    Time spent: 36 minutes.    Dron Tanna Furry, MD [facility] Pager [phone]  If 7PM-7AM, please contact night-coverage [email or web address] Password [facility] 02/06/2016, 3:38 PM

## 2016-02-06 NOTE — Progress Notes (Signed)
    15 minute talk with her about the rationale for diverting colostomy. Also discussed concern about possible remicade reaction.  She grasped what I was saying and seems more open to idea of a colostomy.  I estimated she would need it for 6-12 months and possibly longer.  She asked who would be her surgeon - she is asking about going to [facility].  I told her I thought a diverting colostomy could be done here and well.  I am asking her primary GI MD Dr. Hilarie Fredrickson to add what he can.  Gatha Mayer, MD, [facility] Gastroenterology [phone] (pager) [phone] after 5 PM, weekends and holidays  02/06/2016 8:38 AM

## 2016-02-06 NOTE — Care Management Important Message (Signed)
Important Message  Patient Details  Name: Sara Barnes MRN: [id number] Date of Birth: [birth year]   Medicare Important Message Given:  Yes    [name] 02/06/2016, 9:56 AMImportant Message  Patient Details  Name: Sara Barnes MRN: [id number] Date of Birth: Mar 11, 1941   Medicare Important Message Given:  Yes    [name] 02/06/2016, 9:56 AM

## 2016-02-06 NOTE — Progress Notes (Signed)
Patient ID: Sara Barnes, female   DOB: 11/18/1940, 75 y.o.   MRN: [id number]  [facility] Surgery Progress Note  15 Days Post-Op  Subjective: No new complaints. Had long discussion with Dr. Carlean Purl yesterday about diverting colostomy and may be more open to the idea; wants to discuss further with son before making any decisions.  Objective: Vital signs in last 24 hours: Temp:  [98.1 F (36.7 C)-99 F (37.2 C)] 98.1 F (36.7 C) (10/11 0504) Pulse Rate:  [60-76] 60 (10/11 0504) Resp:  [16-17] 16 (10/11 0504) BP: (105-147)/(57-74) 147/72 (10/11 0504) SpO2:  [97 %-98 %] 97 % (10/11 0504) Last BM Date: 02/05/16  Intake/Output from previous day: 10/10 0701 - 10/11 0700 In: 2603.3 [P.O.:600; I.V.:2003.3] Out: -  Intake/Output this shift: Total I/O In: 240 [P.O.:240] Out: -   PE: Gen:  Alert, NAD, pleasant Abd: soft, nontender, nondistended Rectum: penrose drain in place, stool and gas actively draining through fistulae, skin erythematous but no new breakdown  Lab Results:   Recent Labs  02/05/16 0523 02/06/16 0541  WBC 5.7 4.8  HGB 8.3* 8.1*  HCT 26.0* 25.4*  PLT 189 196   BMET  Recent Labs  02/04/16 0519  NA 138  K 3.6  CL 103  CO2 28  GLUCOSE 83  BUN <5*  CREATININE 0.44  CALCIUM 8.2*   PT/INR No results for input(s): LABPROT, INR in the last 72 hours. CMP     Component Value Date/Time   NA 138 02/04/2016 0519   NA 138 12/06/2014 0804   K 3.6 02/04/2016 0519   K 4.1 12/06/2014 0804   CL 103 02/04/2016 0519   CO2 28 02/04/2016 0519   CO2 25 12/06/2014 0804   GLUCOSE 83 02/04/2016 0519   GLUCOSE 92 12/06/2014 0804   BUN <5 (L) 02/04/2016 0519   BUN 12.3 12/06/2014 0804   CREATININE 0.44 02/04/2016 0519   CREATININE 0.8 12/06/2014 0804   CALCIUM 8.2 (L) 02/04/2016 0519   CALCIUM 9.4 12/06/2014 0804   PROT 5.0 (L) 01/26/2016 0542   PROT 6.5 12/06/2014 0804   ALBUMIN 2.0 (L) 01/26/2016 0542   ALBUMIN 3.3 (L) 12/06/2014 0804   AST 22  01/26/2016 0542   AST 9 12/06/2014 0804   ALT 21 01/26/2016 0542   ALT 10 12/06/2014 0804   ALKPHOS 62 01/26/2016 0542   ALKPHOS 89 12/06/2014 0804   BILITOT 0.4 01/26/2016 0542   BILITOT 0.37 12/06/2014 0804   GFRNONAA >60 02/04/2016 0519   GFRAA >60 02/04/2016 0519   Lipase     Component Value Date/Time   LIPASE 31 04/15/2011 1700       Studies/Results: No results found.  Anti-infectives: Anti-infectives    Start     Dose/Rate Route Frequency Ordered Stop   02/02/16 1100  metroNIDAZOLE (FLAGYL) tablet 500 mg     500 mg Oral Every 12 hours 02/02/16 1024     02/02/16 1100  ciprofloxacin (CIPRO) tablet 500 mg     500 mg Oral 2 times daily 02/02/16 1024     01/31/16 1400  fluconazole (DIFLUCAN) tablet 200 mg     200 mg Oral  Once 01/31/16 1204     01/31/16 0000  imipenem-cilastatin (PRIMAXIN) 500 mg in sodium chloride 0.9 % 100 mL IVPB  Status:  Discontinued     500 mg 200 mL/hr over 30 Minutes Intravenous Every 6 hours 01/30/16 1840 01/31/16 0957   01/30/16 1900  imipenem-cilastatin (PRIMAXIN) 500 mg in sodium chloride 0.9 %  100 mL IVPB     500 mg 200 mL/hr over 30 Minutes Intravenous STAT 01/30/16 1840 01/30/16 1956   01/29/16 2200  metroNIDAZOLE (FLAGYL) tablet 500 mg  Status:  Discontinued     500 mg Oral Every 8 hours 01/29/16 1700 01/30/16 1829   01/29/16 2000  ciprofloxacin (CIPRO) tablet 500 mg  Status:  Discontinued     500 mg Oral 2 times daily 01/29/16 1700 01/30/16 1829   01/22/16 0900  piperacillin-tazobactam (ZOSYN) IVPB 3.375 g  Status:  Discontinued     3.375 g 12.5 mL/hr over 240 Minutes Intravenous Every 8 hours 01/22/16 0840 01/29/16 1700   01/21/16 1830  ciprofloxacin (CIPRO) IVPB 400 mg  Status:  Discontinued     400 mg 200 mL/hr over 60 Minutes Intravenous Every 12 hours 01/21/16 1744 01/22/16 0840   01/21/16 1830  metroNIDAZOLE (FLAGYL) IVPB 500 mg  Status:  Discontinued     500 mg 100 mL/hr over 60 Minutes Intravenous Every 6 hours 01/21/16 1744  01/22/16 0840       Assessment/Plan S/p incision, drainage, and packing of complex perirectal abscess 01/22/16 Dr. Harlow Asa - POD 15 - now with drainage of stool leaking around seton - Continue local wound care/keep the area as clean as we can, sitz baths, barrier cream to minimize skin irritation - after discussion with GI yesterday patient may be more open to a diverting colostomy but wants to discuss further with son before making any decisions  Plan - will continue to follow and discuss with her the benefit of diverting ostomy.    LOS: 29 days    Jerrye Beavers , [facility] Surgery 02/06/2016, 10:05 AM Pager: [phone] Consults: [phone] Mon-Fri 7:00 am-4:30 pm Sat-Sun 7:00 am-11:30 am

## 2016-02-07 ENCOUNTER — Encounter (HOSPITAL_COMMUNITY): Payer: Self-pay | Admitting: Surgery

## 2016-02-07 ENCOUNTER — Encounter: Payer: Commercial Managed Care - HMO | Admitting: Family Medicine

## 2016-02-07 LAB — CBC WITH DIFFERENTIAL/PLATELET
Basophils Absolute: 0 10*3/uL (ref 0.0–0.1)
Basophils Relative: 0 %
EOS ABS: 0 10*3/uL (ref 0.0–0.7)
Eosinophils Relative: 0 %
HEMATOCRIT: 26.3 % — AB (ref 36.0–46.0)
HEMOGLOBIN: 8.4 g/dL — AB (ref 12.0–15.0)
LYMPHS ABS: 1.4 10*3/uL (ref 0.7–4.0)
Lymphocytes Relative: 31 %
MCH: 29.4 pg (ref 26.0–34.0)
MCHC: 31.9 g/dL (ref 30.0–36.0)
MCV: 92 fL (ref 78.0–100.0)
MONOS PCT: 10 %
Monocytes Absolute: 0.5 10*3/uL (ref 0.1–1.0)
NEUTROS ABS: 2.6 10*3/uL (ref 1.7–7.7)
NEUTROS PCT: 59 %
Platelets: 206 10*3/uL (ref 150–400)
RBC: 2.86 MIL/uL — ABNORMAL LOW (ref 3.87–5.11)
RDW: 15.2 % (ref 11.5–15.5)
WBC: 4.6 10*3/uL (ref 4.0–10.5)

## 2016-02-07 LAB — C-REACTIVE PROTEIN: CRP: 2.5 mg/dL — ABNORMAL HIGH (ref <1.0)

## 2016-02-07 MED ORDER — HYDROMORPHONE HCL 1 MG/ML IJ SOLN
1.0000 mg | INTRAMUSCULAR | Status: DC | PRN
  Administered 2016-02-07 – 2016-02-11 (×15): 1 mg via INTRAVENOUS
  Filled 2016-02-07 (×15): qty 1

## 2016-02-07 NOTE — Progress Notes (Signed)
S: no change. Almost ready to agree to diverting colostomy- wants to speak with a few more people first.   Vitals, labs, intake/output, and orders reviewed at this time.  Gen: A&Ox3, no distress  H&N: EOMI, atraumatic, neck supple Chest: unlabored respirations, RRR Abd: soft, nontender, nondistended Penrose drains in place, continued drainage of stool around them with irritation of surrounding skin Ext: warm, no edema Neuro: grossly normal  Lines/tubes/drains: PIV  A/P:  S/p incision, drainage, and packing of complex perirectal abscess 01/22/16 Dr. Harlow Asa - POD 16 - now with drainage of stool leaking around seton - Continue local wound care/keep the area as clean as we can, sitz baths, barrier cream to minimize skin irritation - I let her know that Dr. Hassell Done will be here next week as she does not think she will be ready before then   [name], MD [facility] Surgery, [place] Pager [phone]

## 2016-02-07 NOTE — Progress Notes (Signed)
    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 It appears she has talked to her son and sister regarding this and they are likely going to proceed as this seems to be the best option for her. She would like Dr. Hilarie Fredrickson to talk to her one more time before she has this procedure. She is not "eager" to get this done, but is on the same page as her surgical and GI team in that this would be the best option for her at the moment.    Objective   Vital signs in last 24 hours: Temp:  [98 F (36.7 C)-98.1 F (36.7 C)] 98 F (36.7 C) (10/12 0451) Pulse Rate:  [60-72] 60 (10/12 0451) Resp:  [16] 16 (10/12 0451) BP: (107-137)/(61-69) 137/69 (10/12 0451) SpO2:  [98 %] 98 % (10/12 0451) Last BM Date: 02/07/16 General:  Caucasian female in NAD Heart:  Regular rate and rhythm; no murmurs Lungs: Respirations even and unlabored, lungs CTA bilaterally Abdomen:  Soft, nontender and nondistended. Normal bowel sounds. Extremities:  Without edema. Neurologic:  Alert and oriented,  grossly normal neurologically. Psych:  Cooperative. Normal mood and affect.  Intake/Output from previous day: 10/11 0701 - 10/12 0700 In: 3416.7 [P.O.:720; I.V.:2696.7] Out: -   Lab Results:  Recent Labs  02/05/16 0523 02/06/16 0541 02/07/16 0544  WBC 5.7 4.8 4.6  HGB 8.3* 8.1* 8.4*  HCT 26.0* 25.4* 26.3*  PLT 189 196 206   BMET No results for input(s): NA, K, CL, CO2, GLUCOSE, BUN, CREATININE, CALCIUM in the last 72 hours. LFT No results for input(s): PROT, ALBUMIN, AST, ALT, ALKPHOS, BILITOT, BILIDIR, IBILI in the last 72 hours. PT/INR No results for input(s): LABPROT, INR in the last 72 hours.  Studies/Results: No results found.     Assessment / Plan:     Assessment: 32. 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 She does request that we hold off on scheduling this for the next 2-3 days as she would like her family present. She also would like to speak with Dr. Hilarie Fredrickson one more time before proceeding.  Plan: 1. Continue current pain medications 2. As above, it seems patient will proceed with diverting colostomy, but would like to wait a few days 3. Will discuss above with Dr. Carlean Purl, please await any further recommendations. We will also discuss with Dr. Hilarie Fredrickson to see if he is available to see the patient one more time.  Thank you for your kind consult, we will continue to follow.   LOS: 30 days   Levin Erp  02/07/2016, 10:57 AM  Pager # [phone]

## 2016-02-07 NOTE — Progress Notes (Signed)
PROGRESS NOTE    Sara Barnes  DSK:[id number] DOB: 03-Aug-1940 DOA: 01/07/2016 PCP: Ann Held, DO   Brief Narrative: HPI on 01/07/2016 by Dr. Gilman Schmidt [name] a 75 y.o.femalewith medical history significant of ulcerative colitis versus Crohn's disease, believed to be UC eventually,otherwise healthy, presents to the emergency room with chief complaint of severe abdominal pain for the past several days. She was recently hospitalized and discharged home about 9 days prior to admission. She was here in the hospital with similar complaints, placed on steroids, and tells me today that she improved some and really wanted to go home. At home, she has had ongoing symptoms, never really well achieved full remission, and over the last few days she has felt extremely unwell. She has crampy abdominal pain, nausea, poor appetite, barely able to keep down some Gatorade, and has been having significant amounts of bloody bowel movements as well. She has no fever or chills. She has no chest pain or shortness of breath. She denies any palpitations. She has no lightheadedness or dizziness. She feels generalized weakness. In the emergency room her vital signs are stable, her blood work is essentially unremarkable. Gastroenterology was consulted by EDP and have evaluated patient, and TRH was asked for admission  Interim history Gastroenterology consultation evaluated the patient. She underwent flexible sigmoidoscopy. CMV was negative and she was started on Remicade. She was initially doing better and tolerating some diet and since still having a lot of pain and frequent Bms she was dosed with another Remicaide infusion on 9/17. On 9/21 pain and bloody clots worsened overnight. CT abd/pelvis showed perirectal abscess- General surgery was consulted. Patient underwent I&D on 9/26. Steroids were decreased per GI recommendations. Patient received dose of amikacin on 01/28/2016. Clinically continuing  to improve. Patient is working with physical therapy  UC vs crohn's flare, perirectal abscess s/p drain,  S/p remicade, Currently on IV Solu-Medrol, cipro and flagyl General surgery and GI following for possible diverting colostomy and eventually like discharge to skilled nursing facility  Assessment & Plan:   # Abdominal pain/diarrhea secondary to Ulcerative colitis flare with rectal bleeding/ Perianal abscess -Patient is s/p flexible sigmoidoscopy on 01/08/16, biopsy with findings of active erosive ulcer. No CMV found. Has been given Remicade on 01/09/2016, 01/13/16, and 01/28/2016.Patient is status post I&D of abscess -c diff test negative x2, she was treated with zosynx 1week ,then oral cipro and flagyl, - Currently on ciprofloxacin, Flagyl and IV Solu-Medrol as per gastroenterologist. -Discussion ongoing for possible diverting colostomy by GI and general surgery team. Today, patient is interested in diverting colostomy but would like to discuss with GI team before proceding. She has a list of question for Gi.  At this time, I am waiting from GI and surgery's plan before deciding on discharge plan/date. If patient takes longer time to decide, we may be able to discharge her to rehab or SNF and follow up with GI and surgery consultants outpatient.   # Acute blood loss anemia: Hb stable today. Discontinued heparin sq. Discussed with the patient and she agreed. She still has mild oozing of blood from peri-anal wound.   -She received blood transfusion during this hospitalization.  #Pain management: Patient is currently on opana ER 10 mg bid, dilaudid prn and norco. I further decreased the frequency of dilaudid prn to every 4 hour. Try to taper narcotics as possible.  -pain seems adequately controlled.  # Physical deconditioning: PT, OT evaluation and treatment.  Continue current medical  and supportive care. GI and general surgery consult appreciated.  DVT prophylaxis: SCD and early ambulation.  Discontinued heparin because of bleeding. Code Status: Full code Family Communication: no family present at bedside today. Disposition Plan: Likely discharge to rehabilitation versus SNF in 2-3 days depending on GI and surgery's plan    Consultants: GI and general surgery  Antimicrobials: Cipro and Flagyl   Subjective: Patient was seen and examined at bedside. Patient reported she discussed with her family members and now inclining towards surgical procedure. She still has a lot of question for GI. She reported that her back pain is controlled. Denied fever, chills, headache, dizziness, nausea, vomiting, chest pain or shortness of breath.  Objective: Vitals:   02/06/16 0504 02/06/16 1411 02/06/16 2039 02/07/16 0451  BP: (!) 147/72 107/61 125/65 137/69  Pulse: 60 68 72 60  Resp: 16 16 16 16   Temp: 98.1 F (36.7 C) 98.1 F (36.7 C) 98 F (36.7 C) 98 F (36.7 C)  TempSrc: Oral Oral Oral Axillary  SpO2: 97% 98% 98% 98%  Weight:      Height:        Intake/Output Summary (Last 24 hours) at 02/07/16 1326 Last data filed at 02/07/16 0600  Gross per 24 hour  Intake          2936.67 ml  Output                0 ml  Net          2936.67 ml   Filed Weights   01/07/16 1136  Weight: 72.6 kg (160 lb)    Examination:  General exam: Lying on bed, not in distress  Respiratory system: Clear to auscultation bilateral, no wheeze or crackle. Cardiovascular system: Regular rate rhythm, S1-S2 normal. No pedal edema. Gastrointestinal system: Abdomen soft, nontender and not distended. Bowel sound positive. Perianal gauze with pink discharge. Central nervous system: Alert and oriented. No focal neurological deficits. Extremities: Symmetric 5 x 5 power. Skin: No rashes, lesions or ulcers Psychiatry: Judgement and insight appear normal. Mood & affect appropriate.     Data Reviewed: I have personally reviewed following labs and imaging studies  CBC:  Recent Labs Lab 02/03/16 0643  02/04/16 0519 02/05/16 0523 02/06/16 0541 02/07/16 0544  WBC 8.5 7.3 5.7 4.8 4.6  NEUTROABS 6.3 5.2 3.7 2.9 2.6  HGB 8.9* 8.8* 8.3* 8.1* 8.4*  HCT 27.8* 27.8* 26.0* 25.4* 26.3*  MCV 91.7 91.7 91.9 91.7 92.0  PLT 188 176 189 196 629   Basic Metabolic Panel:  Recent Labs Lab 02/01/16 0553 02/02/16 0618 02/03/16 0643 02/04/16 0519  NA 135 134* 135 138  K 3.2* 3.7 3.6 3.6  CL 105 107 103 103  CO2 25 25 26 28   GLUCOSE 98 86 81 83  BUN <5* 6 <5* <5*  CREATININE 0.40* 0.35* 0.33* 0.44  CALCIUM 7.7* 7.8* 8.0* 8.2*  MG 1.8 1.8  --  1.8   GFR: Estimated Creatinine Clearance: 62.9 mL/min (by C-G formula based on SCr of 0.44 mg/dL). Liver Function Tests: No results for input(s): AST, ALT, ALKPHOS, BILITOT, PROT, ALBUMIN in the last 168 hours. No results for input(s): LIPASE, AMYLASE in the last 168 hours. No results for input(s): AMMONIA in the last 168 hours. Coagulation Profile: No results for input(s): INR, PROTIME in the last 168 hours. Cardiac Enzymes: No results for input(s): CKTOTAL, CKMB, CKMBINDEX, TROPONINI in the last 168 hours. BNP (last 3 results) No results for input(s): PROBNP in the last 8760 hours.  HbA1C: No results for input(s): HGBA1C in the last 72 hours. CBG: No results for input(s): GLUCAP in the last 168 hours. Lipid Profile: No results for input(s): CHOL, HDL, LDLCALC, TRIG, CHOLHDL, LDLDIRECT in the last 72 hours. Thyroid Function Tests: No results for input(s): TSH, T4TOTAL, FREET4, T3FREE, THYROIDAB in the last 72 hours. Anemia Panel: No results for input(s): VITAMINB12, FOLATE, FERRITIN, TIBC, IRON, RETICCTPCT in the last 72 hours. Sepsis Labs: No results for input(s): PROCALCITON, LATICACIDVEN in the last 168 hours.  Recent Results (from the past 240 hour(s))  Culture, blood (routine x 2)     Status: None   Collection Time: 01/30/16  2:47 PM  Result Value Ref Range Status   Specimen Description BLOOD BLOOD RIGHT ARM  Final   Special  Requests BOTTLES DRAWN AEROBIC AND ANAEROBIC 5 CC  Final   Culture   Final    NO GROWTH 5 DAYS Performed at [facility]    Report Status 02/04/2016 FINAL  Final  Culture, blood (routine x 2)     Status: None   Collection Time: 01/30/16  2:47 PM  Result Value Ref Range Status   Specimen Description BLOOD BLOOD RIGHT ARM  Final   Special Requests BOTTLES DRAWN AEROBIC AND ANAEROBIC 5 CC  Final   Culture   Final    NO GROWTH 5 DAYS Performed at [facility]    Report Status 02/04/2016 FINAL  Final  Urine culture     Status: None   Collection Time: 01/30/16  4:53 PM  Result Value Ref Range Status   Specimen Description URINE, RANDOM  Final   Special Requests NONE  Final   Culture NO GROWTH Performed at [facility]   Final   Report Status 01/31/2016 FINAL  Final         Radiology Studies: No results found.      Scheduled Meds: . ciprofloxacin  500 mg Oral BID  . dicyclomine  10 mg Oral TID AC & HS  . fluconazole  200 mg Oral Once  . FLUoxetine  40 mg Oral Daily  . methylPREDNISolone (SOLU-MEDROL) injection  40 mg Intravenous Daily  . metroNIDAZOLE  500 mg Oral Q12H  . midazolam  2 mg Intravenous Once  . oxymorphone  10 mg Oral QAC breakfast  . oxymorphone  10 mg Oral QHS   Continuous Infusions: . sodium chloride 100 mL/hr at 02/07/16 1128     LOS: 30 days    Time spent: 25 minutes.    Kalee Broxton Tanna Furry, MD [facility] Pager [phone]  If 7PM-7AM, please contact night-coverage [email or web address] Password [email or web address] 02/07/2016, 1:26 PM

## 2016-02-08 ENCOUNTER — Telehealth: Payer: Self-pay | Admitting: Internal Medicine

## 2016-02-08 DIAGNOSIS — E876 Hypokalemia: Secondary | ICD-10-CM

## 2016-02-08 LAB — COMPREHENSIVE METABOLIC PANEL
ALT: 16 U/L (ref 14–54)
AST: 16 U/L (ref 15–41)
Albumin: 2.1 g/dL — ABNORMAL LOW (ref 3.5–5.0)
Alkaline Phosphatase: 54 U/L (ref 38–126)
Anion gap: 3 — ABNORMAL LOW (ref 5–15)
BUN: 5 mg/dL — ABNORMAL LOW (ref 6–20)
CHLORIDE: 106 mmol/L (ref 101–111)
CO2: 28 mmol/L (ref 22–32)
CREATININE: 0.47 mg/dL (ref 0.44–1.00)
Calcium: 7.9 mg/dL — ABNORMAL LOW (ref 8.9–10.3)
Glucose, Bld: 99 mg/dL (ref 65–99)
POTASSIUM: 3.3 mmol/L — AB (ref 3.5–5.1)
SODIUM: 137 mmol/L (ref 135–145)
Total Bilirubin: 0.5 mg/dL (ref 0.3–1.2)
Total Protein: 4.5 g/dL — ABNORMAL LOW (ref 6.5–8.1)

## 2016-02-08 LAB — C-REACTIVE PROTEIN: CRP: 1.6 mg/dL — AB (ref <1.0)

## 2016-02-08 MED ORDER — ENSURE ENLIVE PO LIQD
237.0000 mL | ORAL | Status: DC
  Administered 2016-02-08 – 2016-02-21 (×6): 237 mL via ORAL

## 2016-02-08 MED ORDER — TINIDAZOLE 500 MG PO TABS
500.0000 mg | ORAL_TABLET | Freq: Two times a day (BID) | ORAL | Status: DC
  Administered 2016-02-11 – 2016-02-19 (×17): 500 mg via ORAL
  Filled 2016-02-08 (×22): qty 1

## 2016-02-08 MED ORDER — NEOMYCIN SULFATE 500 MG PO TABS
1000.0000 mg | ORAL_TABLET | Freq: Three times a day (TID) | ORAL | Status: AC
  Administered 2016-02-10 (×3): 1000 mg via ORAL
  Filled 2016-02-08 (×3): qty 2

## 2016-02-08 MED ORDER — METRONIDAZOLE 500 MG PO TABS
500.0000 mg | ORAL_TABLET | Freq: Three times a day (TID) | ORAL | Status: DC
  Administered 2016-02-08 – 2016-02-10 (×7): 500 mg via ORAL
  Filled 2016-02-08 (×7): qty 1

## 2016-02-08 MED ORDER — POTASSIUM CHLORIDE CRYS ER 20 MEQ PO TBCR
40.0000 meq | EXTENDED_RELEASE_TABLET | Freq: Every day | ORAL | Status: DC
  Administered 2016-02-08 – 2016-02-16 (×8): 40 meq via ORAL
  Filled 2016-02-08 (×8): qty 2

## 2016-02-08 MED ORDER — METRONIDAZOLE 500 MG PO TABS: 500.0000 mg | ORAL_TABLET | Freq: Three times a day (TID) | ORAL | Status: DC

## 2016-02-08 MED ORDER — POLYETHYLENE GLYCOL 3350 17 G PO PACK: 238.0000 g | PACK | Freq: Once | ORAL | Status: DC

## 2016-02-08 MED ORDER — METHYLPREDNISOLONE SODIUM SUCC 40 MG IJ SOLR
30.0000 mg | Freq: Every day | INTRAMUSCULAR | Status: DC
  Administered 2016-02-09 – 2016-02-13 (×4): 30 mg via INTRAVENOUS
  Filled 2016-02-08 (×4): qty 1

## 2016-02-08 MED ORDER — BISACODYL 5 MG PO TBEC
20.0000 mg | DELAYED_RELEASE_TABLET | Freq: Once | ORAL | Status: AC
  Administered 2016-02-10: 20 mg via ORAL
  Filled 2016-02-08: qty 4

## 2016-02-08 MED ORDER — POTASSIUM CHLORIDE 10 MEQ/100ML IV SOLN
10.0000 meq | INTRAVENOUS | Status: DC
  Administered 2016-02-08: 10 meq via INTRAVENOUS
  Filled 2016-02-08: qty 100

## 2016-02-08 NOTE — Telephone Encounter (Signed)
I spoke to Sara Barnes's sister, Sara Barnes, by phone this afternoon We discussed her overall plan of care at the request of the patient

## 2016-02-08 NOTE — Telephone Encounter (Signed)
Noted  

## 2016-02-08 NOTE — Progress Notes (Signed)
Progress Note    [facility] Attending   I have taken an interval history, reviewed the chart and examined the patient. I agree with the Advanced Practitioner's note, impression and recommendations.   Treatment plan coming together more.  Note that we had been discussing a diverting colostomy but since she has Crohn's colitis think she should have a diverting end ileostomy  (not mucous fistula) after discussion with IBD expert this week.  Will go with oral supplements for nutrition since she can take po.  We will check back by Sunday.  Call if interval ?  Am reducing solumedrol to 30 mg and plan to taper  Gatha Mayer, MD, [facility] [phone] (pager) [phone] after 5 PM, weekends and holidays  02/08/2016 5:21 PM    Assessment / Plan:   Assessment: 63. 75 year old female with Crohn's colitis complicated by perirectal abscess and fistula status post incision and drainage with packing of complex perirectal abscess, packing was removed next day and currently has a drain in place.She received second induction dose of Remicade on 01/28/2016, may need IV methyl prednisone 60 mg x 1 dose with next induction dose of Remicade for possible reaction to the last infusion.At this time after multiple discussions with both the surgical and GI team, the patient is willing to proceed with surgery, this will be scheduled next week  Plan: 1. Ordered consult to dietitian in regards to starting TPN prior to procedure neck week, would like albumin greater than 3 per Dr. Hilarie Fredrickson 2. Please see Dr. Vena Rua extensive attestation to my note yesterday for detailed recommendations 3. Will discuss above with Dr. Carlean Purl, please await any further recommendations.  Thank you for your kind consult, we will continue to follow.   LOS: 31 days   Levin Erp  02/08/2016, 10:41 AM  Pager # [phone]        Subjective  Chief Complaint: Crohn's colitis,  treated by perirectal abscess and fistula status post incision and drainage with packing complex perirectal abscess  Today, the patient is found sitting with her sister, they are discussing Dr. Vena Rua notes and recommendations from yesterday. The patient tells me that she is going to proceed with the diverting colostomy which she is told will occur next week. She is awaiting further information from Dr. Hilarie Fredrickson. She denies any new complaints or concerns    Objective   Vital signs in last 24 hours: Temp:  [97.7 F (36.5 C)-98.6 F (37 C)] 97.7 F (36.5 C) (10/13 0410) Pulse Rate:  [63-72] 63 (10/13 0410) Resp:  [16-18] 18 (10/13 0410) BP: (125-154)/(68-74) 154/74 (10/13 0410) SpO2:  [97 %-98 %] 97 % (10/13 0410) Last BM Date: 02/07/16 General: Caucasian female in NAD Heart:  Regular rate and rhythm; no murmurs Lungs: Respirations even and unlabored, lungs CTA bilaterally Abdomen:  Soft, mild generalized tenderness and nondistended. Normal bowel sounds. Extremities:  Without edema. Neurologic:  Alert and oriented,  grossly normal neurologically. Psych:  Cooperative. Normal mood and affect.   Lab Results:  Recent Labs  02/06/16 0541 02/07/16 0544  WBC 4.8 4.6  HGB 8.1* 8.4*  HCT 25.4* 26.3*  PLT 196 206   BMET  Recent Labs  02/08/16 0627  NA 137  K 3.3*  CL 106  CO2 28  GLUCOSE 99  BUN 5*  CREATININE 0.47  CALCIUM 7.9*   LFT  Recent Labs  02/08/16 0627  PROT 4.5*  ALBUMIN 2.1*  AST 16  ALT 16  ALKPHOS 54  BILITOT 0.5

## 2016-02-08 NOTE — Progress Notes (Signed)
Initial Nutrition Assessment  DOCUMENTATION CODES:   Not applicable  INTERVENTION:  Discussed plan with PA. Patient is not appropriate candidate for TPN since she is able to tolerate PO diet. Will ensure patient is receiving adequate nutrition in the next week prior to surgery.   Albumin has a half-life of 21 days and is strongly affected by stress response and inflammatory process, therefore, do not expect to see an improvement in this lab value during acute hospitalization.  Ordered Ensure Enlive po once daily, each supplement provides 350 kcal and 20 grams of protein.  NUTRITION DIAGNOSIS:   Increased nutrient needs related to catabolic illness as evidenced by estimated needs.  GOAL:   Patient will meet greater than or equal to 90% of their needs  MONITOR:   PO intake, Supplement acceptance, Weight trends, Labs, I & O's  REASON FOR ASSESSMENT:   Consult New TPN/TNA (TPN prior to diverting ileostomy next week and to improve albumin)  ASSESSMENT:   75 y.o.femalewith medical history significant of ulcerative colitis versus Crohn's disease, believed to be UC eventually,otherwise healthy, presents to the emergency room with chief complaint of severe abdominal pain for the past several days PTA.   Pertinent Hospital Course: -CT abd/pelvis earlier in admission showed perirectal abscess. Patient underwent I&D of perirectal abscess on 9/26. -S/p remicade. Currently on IV solu-medrol, cipro, and flagyl. -Plan for diverting ileostomy next week.  RD consulted to see patient in regards to starting TPN prior to diverting ileostomy procedure planned for next week, albumin currently 2.1, team would like to increase this >3 for better surgical outcome.  RD had seen patient earlier in stay for LOS and patient had been eating well and denies weight loss. Patient reports her appetite remains good and she is eating well. Reports abdominal pain, diarrhea, and occasional nausea. Her UBW is  160 lbs and she had been weight stable prior to admission. The only foods she avoids at home for UC is coffee, fresh vegetables, and mild products (lactose). Patient is amenable to drinking Ensure daily to increase intake of protein and calories as long as it is lactose-free. Assured patient Ensure is lactose-free, but if she is unable to tolerate it still, we can switch to another supplement.  Meal Completion: 100% per chart and patient for most of stay. She occasionally eats 25-50% of meals per chart, but mostly finishes meals. In the past 24 hours, patient has had 1958 kcal (100% minimum estimated kcal needs) and 75 grams protein (79% minimum estimated protein needs).  Weight trend: CBW is 148.3 lbs (-11.7 lbs from admission weight of 160 lbs). This is a 7% weight loss in 1 month, which is significant for time frame.  Medications reviewed and include: methylprednisolone 40 mg daily, potassium chloride 10 mEq 3 times today, potassium chloride 40 mEq daily, NS @ 100 ml/hr.  Labs reviewed: Potassium 3.3, BUN 5, Albumin 2.1, Total Protein 4.5, CRP 1.6.  Nutrition-Focused physical exam completed. Findings are mild fat depletion, mild muscle depletion, and mild edema.   Discussed plan with RN.   Diet Order:  DIET SOFT Room service appropriate? Yes; Fluid consistency: Thin  Skin:  Wound (see comment) (MSAD to perineum, buttocks; closed incision rectum)  Last BM:  02/08/2016  Height:   Ht Readings from Last 1 Encounters:  01/07/16 5' 6"  (1.676 m)    Weight:   Wt Readings from Last 1 Encounters:  02/08/16 148 lb 4.8 oz (67.3 kg)    Ideal Body Weight:  59.09 kg  BMI:  Body mass index is 23.94 kg/m.  Estimated Nutritional Needs:   Kcal:  1675-1915 (MSJ x 1.4-1.6)  Protein:  95-110 grams (1.4-1.6 g/kg)  Fluid:  1.6-1.9 L/day  EDUCATION NEEDS:   No education needs identified at this time  Willey Blade, MS, RD, LDN Pager: [phone] After Hours Pager: [phone]

## 2016-02-08 NOTE — Progress Notes (Signed)
PROGRESS NOTE    Sara Barnes  DJM:[id number] DOB: 1940-09-08 DOA: 01/07/2016 PCP: Ann Held, DO   Brief Narrative: HPI on 01/07/2016 by Dr. Gilman Schmidt [name] a 75 y.o.femalewith medical history significant of ulcerative colitis versus Crohn's disease, believed to be UC eventually,otherwise healthy, presents to the emergency room with chief complaint of severe abdominal pain for the past several days. She was recently hospitalized and discharged home about 9 days prior to admission. She was here in the hospital with similar complaints, placed on steroids, and tells me today that she improved some and really wanted to go home. At home, she has had ongoing symptoms, never really well achieved full remission, and over the last few days she has felt extremely unwell. She has crampy abdominal pain, nausea, poor appetite, barely able to keep down some Gatorade, and has been having significant amounts of bloody bowel movements as well. She has no fever or chills. She has no chest pain or shortness of breath. She denies any palpitations. She has no lightheadedness or dizziness. She feels generalized weakness. In the emergency room her vital signs are stable, her blood work is essentially unremarkable. Gastroenterology was consulted by EDP and have evaluated patient, and TRH was asked for admission  Interim history Gastroenterology consultation evaluated the patient. She underwent flexible sigmoidoscopy. CMV was negative and she was started on Remicade. She was initially doing better and tolerating some diet and since still having a lot of pain and frequent Bms she was dosed with another Remicaide infusion on 9/17. On 9/21 pain and bloody clots worsened overnight. CT abd/pelvis showed perirectal abscess- General surgery was consulted. Patient underwent I&D on 9/26. Steroids were decreased per GI recommendations. Patient received dose of amikacin on 01/28/2016. Clinically continuing  to improve. Patient is working with physical therapy  UC vs crohn's flare, perirectal abscess s/p drain,  S/p remicade, Currently on IV Solu-Medrol, cipro and flagyl General surgery and GI following for possible diverting colostomy and eventually like discharge to skilled nursing facility  Assessment & Plan:   # Abdominal pain/diarrhea secondary to Ulcerative colitis flare with rectal bleeding/ Perianal abscess -Patient is s/p flexible sigmoidoscopy on 01/08/16, biopsy with findings of active erosive ulcer. No CMV found. Has been given Remicade on 01/09/2016, 01/13/16, and 01/28/2016.Patient is status post I&D of abscess -c diff test negative x2, she was treated with zosynx 1week ,then oral cipro and flagyl, - Currently on ciprofloxacin, IV solumedrol and switching flagyl to tinidazole as per Gi.  -plan to start TPN as per Gi, dietician was referred.  - patient is now interested for diverting colostomy surgery, plan for surgery next as per consultants.   # Acute blood loss anemia: Hb stable. Off heparin sq.  -She received blood transfusion during this hospitalization.  #Pain management: Patient is currently on opana ER 10 mg bid, dilaudid prn and norco. Trying to taper the dose of dilaudid if possible.  -pain seems adequately controlled.  # Physical deconditioning: PT, OT evaluation and treatment.  #Hypokalemia: Replete IV and oral potassium. Monitor labs.  Continue current medical and supportive care. GI and general surgery consult appreciated.  DVT prophylaxis: SCD and early ambulation. Discontinued heparin because of bleeding. Code Status: Full code Family Communication: no family present at bedside today. Disposition Plan: Likely discharge to rehabilitation versus SNF in 3-4 days depending on GI and surgery's plan    Consultants: GI and general surgery  Antimicrobials: Cipro and tinidazole.   Subjective: Patient was seen  and examined at bedside. Patient reported that she  discussed with the GI and surgeon and planned to go for surgical procedure next week. Patient denied pain. Denied headache, dizziness, nausea, vomiting, chest pain or shortness of breath.  Objective: Vitals:   02/07/16 0451 02/07/16 1430 02/07/16 2135 02/08/16 0410  BP: 137/69 131/68 125/71 (!) 154/74  Pulse: 60 72 64 63  Resp: 16 16 16 18   Temp: 98 F (36.7 C) 98.6 F (37 C) 98 F (36.7 C) 97.7 F (36.5 C)  TempSrc: Axillary Oral Oral Axillary  SpO2: 98% 98% 98% 97%  Weight:      Height:        Intake/Output Summary (Last 24 hours) at 02/08/16 1231 Last data filed at 02/08/16 0759  Gross per 24 hour  Intake              480 ml  Output                0 ml  Net              480 ml   Filed Weights   01/07/16 1136  Weight: 72.6 kg (160 lb)    Examination:  General exam:Not in distress. Lying comfortable.  Respiratory system: Clear to auscultation bilateral, no wheeze. Cardiovascular system: Regular rate and rhythm, S1-S2 normal. No pedal edema. Gastrointestinal system: Abdomen soft, nontender and not distended. Bowel sound positive.. Gauze with no sign of bleeding. Central nervous system: Alert and oriented and following commands Extremities: Symmetric 5 x 5 power. Skin: No rashes, lesions or ulcers Psychiatry: Judgement and insight appear normal. Mood & affect appropriate.     Data Reviewed: I have personally reviewed following labs and imaging studies  CBC:  Recent Labs Lab 02/03/16 0643 02/04/16 0519 02/05/16 0523 02/06/16 0541 02/07/16 0544  WBC 8.5 7.3 5.7 4.8 4.6  NEUTROABS 6.3 5.2 3.7 2.9 2.6  HGB 8.9* 8.8* 8.3* 8.1* 8.4*  HCT 27.8* 27.8* 26.0* 25.4* 26.3*  MCV 91.7 91.7 91.9 91.7 92.0  PLT 188 176 189 196 355   Basic Metabolic Panel:  Recent Labs Lab 02/02/16 0618 02/03/16 0643 02/04/16 0519 02/08/16 0627  NA 134* 135 138 137  K 3.7 3.6 3.6 3.3*  CL 107 103 103 106  CO2 25 26 28 28   GLUCOSE 86 81 83 99  BUN 6 <5* <5* 5*  CREATININE  0.35* 0.33* 0.44 0.47  CALCIUM 7.8* 8.0* 8.2* 7.9*  MG 1.8  --  1.8  --    GFR: Estimated Creatinine Clearance: 62.9 mL/min (by C-G formula based on SCr of 0.47 mg/dL). Liver Function Tests:  Recent Labs Lab 02/08/16 0627  AST 16  ALT 16  ALKPHOS 54  BILITOT 0.5  PROT 4.5*  ALBUMIN 2.1*   No results for input(s): LIPASE, AMYLASE in the last 168 hours. No results for input(s): AMMONIA in the last 168 hours. Coagulation Profile: No results for input(s): INR, PROTIME in the last 168 hours. Cardiac Enzymes: No results for input(s): CKTOTAL, CKMB, CKMBINDEX, TROPONINI in the last 168 hours. BNP (last 3 results) No results for input(s): PROBNP in the last 8760 hours. HbA1C: No results for input(s): HGBA1C in the last 72 hours. CBG: No results for input(s): GLUCAP in the last 168 hours. Lipid Profile: No results for input(s): CHOL, HDL, LDLCALC, TRIG, CHOLHDL, LDLDIRECT in the last 72 hours. Thyroid Function Tests: No results for input(s): TSH, T4TOTAL, FREET4, T3FREE, THYROIDAB in the last 72 hours. Anemia Panel: No results for input(s):  VITAMINB12, FOLATE, FERRITIN, TIBC, IRON, RETICCTPCT in the last 72 hours. Sepsis Labs: No results for input(s): PROCALCITON, LATICACIDVEN in the last 168 hours.  Recent Results (from the past 240 hour(s))  Culture, blood (routine x 2)     Status: None   Collection Time: 01/30/16  2:47 PM  Result Value Ref Range Status   Specimen Description BLOOD BLOOD RIGHT ARM  Final   Special Requests BOTTLES DRAWN AEROBIC AND ANAEROBIC 5 CC  Final   Culture   Final    NO GROWTH 5 DAYS Performed at [facility]    Report Status 02/04/2016 FINAL  Final  Culture, blood (routine x 2)     Status: None   Collection Time: 01/30/16  2:47 PM  Result Value Ref Range Status   Specimen Description BLOOD BLOOD RIGHT ARM  Final   Special Requests BOTTLES DRAWN AEROBIC AND ANAEROBIC 5 CC  Final   Culture   Final    NO GROWTH 5 DAYS Performed at [facility]    Report Status 02/04/2016 FINAL  Final  Urine culture     Status: None   Collection Time: 01/30/16  4:53 PM  Result Value Ref Range Status   Specimen Description URINE, RANDOM  Final   Special Requests NONE  Final   Culture NO GROWTH Performed at [facility]   Final   Report Status 01/31/2016 FINAL  Final         Radiology Studies: No results found.      Scheduled Meds: . ciprofloxacin  500 mg Oral BID  . dicyclomine  10 mg Oral TID AC & HS  . fluconazole  200 mg Oral Once  . FLUoxetine  40 mg Oral Daily  . methylPREDNISolone (SOLU-MEDROL) injection  40 mg Intravenous Daily  . metroNIDAZOLE  500 mg Oral Q12H  . midazolam  2 mg Intravenous Once  . oxymorphone  10 mg Oral QAC breakfast  . oxymorphone  10 mg Oral QHS  . [START ON 02/11/2016] tinidazole  500 mg Oral BID   Continuous Infusions: . sodium chloride Stopped (02/08/16 0620)     LOS: 31 days    Time spent: 76mnutes.    Dron PTanna Furry MD [facility] Pager [phone] If 7PM-7AM, please contact night-coverage [email or web address] Password [email or web address] 02/08/2016, 12:31 PM

## 2016-02-08 NOTE — Progress Notes (Addendum)
Patient's potassium running at 20 ml/hr per previous RN d/t patient c/o burning at IV site.  Patient complaining of burning again; states she is unable to tolerate anything running through IV currently.  IV has been stopped.  MD informed. Informed and educated patient of need to start new IV d/t possible irritation at site; patient refuses any care to IV at this time.  Will continue to monitor.

## 2016-02-08 NOTE — Progress Notes (Signed)
Patient ID: Sara Barnes, female   DOB: [birth year], 75 y.o.   MRN: [id number]  [facility] Surgery Progress Note  17 Days Post-Op  Subjective: In good spirits today. States that she spoke with Dr. Hilarie Fredrickson yesterday and is at peace with having a diverting colostomy next week.  Objective: Vital signs in last 24 hours: Temp:  [97.7 F (36.5 C)-98.6 F (37 C)] 97.7 F (36.5 C) (10/13 0410) Pulse Rate:  [63-72] 63 (10/13 0410) Resp:  [16-18] 18 (10/13 0410) BP: (125-154)/(68-74) 154/74 (10/13 0410) SpO2:  [97 %-98 %] 97 % (10/13 0410) Last BM Date: 02/07/16  Intake/Output from previous day: 10/12 0701 - 10/13 0700 In: 240 [P.O.:240] Out: -  Intake/Output this shift: Total I/O In: 240 [P.O.:240] Out: -   PE: Gen:  Alert, NAD, pleasant Abd: soft, nontender, nondistended Rectum: penrose drain in place, stool and gas actively draining through fistulae, skin erythematous but no new breakdown  Lab Results:   Recent Labs  02/06/16 0541 02/07/16 0544  WBC 4.8 4.6  HGB 8.1* 8.4*  HCT 25.4* 26.3*  PLT 196 206   BMET  Recent Labs  02/08/16 0627  NA 137  K 3.3*  CL 106  CO2 28  GLUCOSE 99  BUN 5*  CREATININE 0.47  CALCIUM 7.9*   PT/INR No results for input(s): LABPROT, INR in the last 72 hours. CMP     Component Value Date/Time   NA 137 02/08/2016 0627   NA 138 12/06/2014 0804   K 3.3 (L) 02/08/2016 0627   K 4.1 12/06/2014 0804   CL 106 02/08/2016 0627   CO2 28 02/08/2016 0627   CO2 25 12/06/2014 0804   GLUCOSE 99 02/08/2016 0627   GLUCOSE 92 12/06/2014 0804   BUN 5 (L) 02/08/2016 0627   BUN 12.3 12/06/2014 0804   CREATININE 0.47 02/08/2016 0627   CREATININE 0.8 12/06/2014 0804   CALCIUM 7.9 (L) 02/08/2016 0627   CALCIUM 9.4 12/06/2014 0804   PROT 4.5 (L) 02/08/2016 0627   PROT 6.5 12/06/2014 0804   ALBUMIN 2.1 (L) 02/08/2016 0627   ALBUMIN 3.3 (L) 12/06/2014 0804   AST 16 02/08/2016 0627   AST 9 12/06/2014 0804   ALT 16 02/08/2016 0627   ALT  10 12/06/2014 0804   ALKPHOS 54 02/08/2016 0627   ALKPHOS 89 12/06/2014 0804   BILITOT 0.5 02/08/2016 0627   BILITOT 0.37 12/06/2014 0804   GFRNONAA >60 02/08/2016 0627   GFRAA >60 02/08/2016 0627   Lipase     Component Value Date/Time   LIPASE 31 04/15/2011 1700       Studies/Results: No results found.  Anti-infectives: Anti-infectives    Start     Dose/Rate Route Frequency Ordered Stop   02/02/16 1100  metroNIDAZOLE (FLAGYL) tablet 500 mg     500 mg Oral Every 12 hours 02/02/16 1024     02/02/16 1100  ciprofloxacin (CIPRO) tablet 500 mg     500 mg Oral 2 times daily 02/02/16 1024     01/31/16 1400  fluconazole (DIFLUCAN) tablet 200 mg     200 mg Oral  Once 01/31/16 1204     01/31/16 0000  imipenem-cilastatin (PRIMAXIN) 500 mg in sodium chloride 0.9 % 100 mL IVPB  Status:  Discontinued     500 mg 200 mL/hr over 30 Minutes Intravenous Every 6 hours 01/30/16 1840 01/31/16 0957   01/30/16 1900  imipenem-cilastatin (PRIMAXIN) 500 mg in sodium chloride 0.9 % 100 mL IVPB     500  mg 200 mL/hr over 30 Minutes Intravenous STAT 01/30/16 1840 01/30/16 1956   01/29/16 2200  metroNIDAZOLE (FLAGYL) tablet 500 mg  Status:  Discontinued     500 mg Oral Every 8 hours 01/29/16 1700 01/30/16 1829   01/29/16 2000  ciprofloxacin (CIPRO) tablet 500 mg  Status:  Discontinued     500 mg Oral 2 times daily 01/29/16 1700 01/30/16 1829   01/22/16 0900  piperacillin-tazobactam (ZOSYN) IVPB 3.375 g  Status:  Discontinued     3.375 g 12.5 mL/hr over 240 Minutes Intravenous Every 8 hours 01/22/16 0840 01/29/16 1700   01/21/16 1830  ciprofloxacin (CIPRO) IVPB 400 mg  Status:  Discontinued     400 mg 200 mL/hr over 60 Minutes Intravenous Every 12 hours 01/21/16 1744 01/22/16 0840   01/21/16 1830  metroNIDAZOLE (FLAGYL) IVPB 500 mg  Status:  Discontinued     500 mg 100 mL/hr over 60 Minutes Intravenous Every 6 hours 01/21/16 1744 01/22/16 0840       Assessment/Plan S/p incision, drainage, and  packing of complex perirectal abscess 01/22/16 Dr. Harlow Asa - POD 17 - now with drainage of stool leaking around seton - Continue local wound care/keep the area as clean as we can, sitz baths, barrier cream to minimize skin irritation  Plan - patient prepared to have diverting colostomy next week with Dr. Hassell Done   LOS: 72 days    Jerrye Beavers , [facility] Surgery 02/08/2016, 8:22 AM Pager: [phone] Consults: [phone] Mon-Fri 7:00 am-4:30 pm Sat-Sun 7:00 am-11:30 am

## 2016-02-08 NOTE — Progress Notes (Signed)
Assumed care of patient from previous RN.  Agree with previous RN's assessment of patient.  Will continue to monitor.

## 2016-02-08 NOTE — Telephone Encounter (Signed)
Please see message below regarding calling pts sister.

## 2016-02-08 NOTE — Progress Notes (Signed)
Pharmacy Note re: Tinidazole  Order received to start tinidazole once available.  Pharmacy has ordered with medication from wholesaler with anticipated arrival date of Mon, Oct 16th.  Orders placed in computer to switch metronidazole to tinidazole on Monday per GI request.    Thanks! Netta Cedars, PharmD, BCPS Pager: [phone] 02/08/2016@11 :27 AM

## 2016-02-08 NOTE — Progress Notes (Signed)
[facility] will continue to follow/ assist with patient disposition-[facility] once medically Stable. SNF notified.

## 2016-02-09 LAB — C-REACTIVE PROTEIN: CRP: 1.7 mg/dL — ABNORMAL HIGH (ref <1.0)

## 2016-02-09 NOTE — Progress Notes (Signed)
PROGRESS NOTE    Sara Barnes  GTX:[id number] DOB: 05-02-1940 DOA: 01/07/2016 PCP: Ann Held, DO   Brief Narrative: HPI on 01/07/2016 by Dr. Gilman Schmidt [name] a 75 y.o.femalewith medical history significant of ulcerative colitis versus Crohn's disease, believed to be UC eventually,otherwise healthy, presents to the emergency room with chief complaint of severe abdominal pain for the past several days. She was recently hospitalized and discharged home about 9 days prior to admission. She was here in the hospital with similar complaints, placed on steroids, and tells me today that she improved some and really wanted to go home. At home, she has had ongoing symptoms, never really well achieved full remission, and over the last few days she has felt extremely unwell. She has crampy abdominal pain, nausea, poor appetite, barely able to keep down some Gatorade, and has been having significant amounts of bloody bowel movements as well. She has no fever or chills. She has no chest pain or shortness of breath. She denies any palpitations. She has no lightheadedness or dizziness. She feels generalized weakness. In the emergency room her vital signs are stable, her blood work is essentially unremarkable. Gastroenterology was consulted by EDP and have evaluated patient, and TRH was asked for admission  Interim history Gastroenterology consultation evaluated the patient. She underwent flexible sigmoidoscopy. CMV was negative and she was started on Remicade. She was initially doing better and tolerating some diet and since still having a lot of pain and frequent Bms she was dosed with another Remicaide infusion on 9/17. On 9/21 pain and bloody clots worsened overnight. CT abd/pelvis showed perirectal abscess- General surgery was consulted. Patient underwent I&D on 9/26. Steroids were decreased per GI recommendations. Patient received dose of amikacin on 01/28/2016. Clinically continuing  to improve. Patient is working with physical therapy  UC vs crohn's flare, perirectal abscess s/p drain,  S/p remicade, Currently on IV Solu-Medrol, cipro and flagyl General surgery and GI following for possible diverting colostomy and eventually like discharge to skilled nursing facility  Assessment & Plan:   # Abdominal pain/diarrhea secondary to Ulcerative colitis flare with rectal bleeding/ Perianal abscess -Patient is s/p flexible sigmoidoscopy on 01/08/16, biopsy with findings of active erosive ulcer. No CMV found. Has been given Remicade on 01/09/2016, 01/13/16, and 01/28/2016.Patient is status post I&D of abscess -c diff test negative x2, she was treated with zosynx 1week ,then oral cipro and flagyl, - Currently on ciprofloxacin, IV solumedrol and switched flagyl to tinidazole by Gi.  -plan for surgery on Monday (10/16) as per GI and surgery team.  Continue to provide supportive care and follow up consultant's recommendation.   # Acute blood loss anemia: Hb stable. Off heparin sq.  -She received blood transfusion during this hospitalization. -intermittent labs  #Pain management: Patient is currently on opana ER 10 mg bid, dilaudid prn and norco. Trying to taper the dose of dilaudid if possible.  -pain seems adequately controlled.  # Physical deconditioning: PT, OT evaluation and treatment.  #Hypokalemia: Monitor labs intermittently.  Continue current medical and supportive care. GI and general surgery consult appreciated.  DVT prophylaxis: SCD and early ambulation. Discontinued heparin because of bleeding from peri-anal wound, stopped now. Code Status: Full code Family Communication: no family present at bedside today. Disposition Plan: Likely discharge to rehabilitation versus SNF in 3-4 days depending on GI and surgery's plan    Consultants: GI and general surgery  Antimicrobials: Cipro and tinidazole.   Subjective: Patient was seen and examined  at bedside. No new  event. Patient denied nausea, vomiting, chest pain, shortness of breath, abdominal pain or diarrhea. She has a good oral intake. She knows about surgery on Monday. Objective: Vitals:   02/08/16 1314 02/08/16 1416 02/08/16 2106 02/09/16 0704  BP: 126/75  132/72 139/73  Pulse: 64  (!) 59 64  Resp: 16  17 17   Temp: 97.8 F (36.6 C)  98 F (36.7 C) 97.1 F (36.2 C)  TempSrc: Axillary  Oral Axillary  SpO2: 99%  100% 99%  Weight:  67.3 kg (148 lb 4.8 oz)    Height:        Intake/Output Summary (Last 24 hours) at 02/09/16 1218 Last data filed at 02/09/16 0600  Gross per 24 hour  Intake          3823.33 ml  Output                0 ml  Net          3823.33 ml   Filed Weights   01/07/16 1136 02/08/16 1416  Weight: 72.6 kg (160 lb) 67.3 kg (148 lb 4.8 oz)    Examination:  General exam:Not in distress lying comfortably. Respiratory system: Bilateral clear to auscultation, no wheeze Cardiovascular system: Regular rate rhythm, S1-S2 normal.. Gastrointestinal system: Abdomen soft nontender and nondistended. Bowel sound positive.perianal gauze with no sign of bleeding.  Central nervous system: Alert awake and following commands. Extremities: Symmetric 5 x 5 power. Skin: No rashes, lesions or ulcers Psychiatry: Judgement and insight appear normal. Mood & affect appropriate.     Data Reviewed: I have personally reviewed following labs and imaging studies  CBC:  Recent Labs Lab 02/03/16 0643 02/04/16 0519 02/05/16 0523 02/06/16 0541 02/07/16 0544  WBC 8.5 7.3 5.7 4.8 4.6  NEUTROABS 6.3 5.2 3.7 2.9 2.6  HGB 8.9* 8.8* 8.3* 8.1* 8.4*  HCT 27.8* 27.8* 26.0* 25.4* 26.3*  MCV 91.7 91.7 91.9 91.7 92.0  PLT 188 176 189 196 759   Basic Metabolic Panel:  Recent Labs Lab 02/03/16 0643 02/04/16 0519 02/08/16 0627  NA 135 138 137  K 3.6 3.6 3.3*  CL 103 103 106  CO2 26 28 28   GLUCOSE 81 83 99  BUN <5* <5* 5*  CREATININE 0.33* 0.44 0.47  CALCIUM 8.0* 8.2* 7.9*  MG  --  1.8   --    GFR: Estimated Creatinine Clearance: 57.8 mL/min (by C-G formula based on SCr of 0.47 mg/dL). Liver Function Tests:  Recent Labs Lab 02/08/16 0627  AST 16  ALT 16  ALKPHOS 54  BILITOT 0.5  PROT 4.5*  ALBUMIN 2.1*   No results for input(s): LIPASE, AMYLASE in the last 168 hours. No results for input(s): AMMONIA in the last 168 hours. Coagulation Profile: No results for input(s): INR, PROTIME in the last 168 hours. Cardiac Enzymes: No results for input(s): CKTOTAL, CKMB, CKMBINDEX, TROPONINI in the last 168 hours. BNP (last 3 results) No results for input(s): PROBNP in the last 8760 hours. HbA1C: No results for input(s): HGBA1C in the last 72 hours. CBG: No results for input(s): GLUCAP in the last 168 hours. Lipid Profile: No results for input(s): CHOL, HDL, LDLCALC, TRIG, CHOLHDL, LDLDIRECT in the last 72 hours. Thyroid Function Tests: No results for input(s): TSH, T4TOTAL, FREET4, T3FREE, THYROIDAB in the last 72 hours. Anemia Panel: No results for input(s): VITAMINB12, FOLATE, FERRITIN, TIBC, IRON, RETICCTPCT in the last 72 hours. Sepsis Labs: No results for input(s): PROCALCITON, LATICACIDVEN in the last 168  hours.  Recent Results (from the past 240 hour(s))  Culture, blood (routine x 2)     Status: None   Collection Time: 01/30/16  2:47 PM  Result Value Ref Range Status   Specimen Description BLOOD BLOOD RIGHT ARM  Final   Special Requests BOTTLES DRAWN AEROBIC AND ANAEROBIC 5 CC  Final   Culture   Final    NO GROWTH 5 DAYS Performed at [facility]    Report Status 02/04/2016 FINAL  Final  Culture, blood (routine x 2)     Status: None   Collection Time: 01/30/16  2:47 PM  Result Value Ref Range Status   Specimen Description BLOOD BLOOD RIGHT ARM  Final   Special Requests BOTTLES DRAWN AEROBIC AND ANAEROBIC 5 CC  Final   Culture   Final    NO GROWTH 5 DAYS Performed at [facility]    Report Status 02/04/2016 FINAL  Final  Urine culture      Status: None   Collection Time: 01/30/16  4:53 PM  Result Value Ref Range Status   Specimen Description URINE, RANDOM  Final   Special Requests NONE  Final   Culture NO GROWTH Performed at [facility]   Final   Report Status 01/31/2016 FINAL  Final         Radiology Studies: No results found.      Scheduled Meds: . [START ON 02/10/2016] bisacodyl  20 mg Oral Once  . ciprofloxacin  500 mg Oral BID  . dicyclomine  10 mg Oral TID AC & HS  . feeding supplement (ENSURE ENLIVE)  237 mL Oral Q24H  . fluconazole  200 mg Oral Once  . FLUoxetine  40 mg Oral Daily  . methylPREDNISolone (SOLU-MEDROL) injection  30 mg Intravenous Daily  . [START ON 02/10/2016] metroNIDAZOLE  500 mg Oral TID  . metroNIDAZOLE  500 mg Oral Q8H  . midazolam  2 mg Intravenous Once  . [START ON 02/10/2016] neomycin  1,000 mg Oral TID  . oxymorphone  10 mg Oral QAC breakfast  . oxymorphone  10 mg Oral QHS  . [START ON 02/10/2016] polyethylene glycol  238 g Oral Once  . potassium chloride  40 mEq Oral Daily  . [START ON 02/11/2016] tinidazole  500 mg Oral BID   Continuous Infusions: . sodium chloride Stopped (02/08/16 1530)     LOS: 32 days    Time spent:18 minutes.    Dron Tanna Furry, MD [facility] Pager [phone]  If 7PM-7AM, please contact night-coverage [email or web address] Password [email or web address] 02/09/2016, 12:18 PM

## 2016-02-10 LAB — BASIC METABOLIC PANEL
Anion gap: 8 (ref 5–15)
BUN: 7 mg/dL (ref 6–20)
CALCIUM: 8.8 mg/dL — AB (ref 8.9–10.3)
CHLORIDE: 97 mmol/L — AB (ref 101–111)
CO2: 29 mmol/L (ref 22–32)
CREATININE: 0.65 mg/dL (ref 0.44–1.00)
GFR calc non Af Amer: >60 mL/min (ref >60)
Glucose, Bld: 85 mg/dL (ref 65–99)
Potassium: 4.1 mmol/L (ref 3.5–5.1)
SODIUM: 134 mmol/L — AB (ref 135–145)

## 2016-02-10 LAB — CBC
HCT: 32.1 % — ABNORMAL LOW (ref 36.0–46.0)
HEMOGLOBIN: 10.3 g/dL — AB (ref 12.0–15.0)
MCH: 28.7 pg (ref 26.0–34.0)
MCHC: 32.1 g/dL (ref 30.0–36.0)
MCV: 89.4 fL (ref 78.0–100.0)
PLATELETS: 277 10*3/uL (ref 150–400)
RBC: 3.59 MIL/uL — AB (ref 3.87–5.11)
RDW: 15.3 % (ref 11.5–15.5)
WBC: 7.4 10*3/uL (ref 4.0–10.5)

## 2016-02-10 LAB — MAGNESIUM: MAGNESIUM: 2.1 mg/dL (ref 1.7–2.4)

## 2016-02-10 MED ORDER — POLYETHYLENE GLYCOL 3350 17 GM/SCOOP PO POWD
1.0000 | Freq: Once | ORAL | Status: AC
  Administered 2016-02-10: 255 g via ORAL
  Filled 2016-02-10: qty 255

## 2016-02-10 NOTE — Progress Notes (Signed)
PROGRESS NOTE    Sara Barnes  XBM:[id number] DOB: 02-11-1941 DOA: 01/07/2016 PCP: Ann Held, DO   Brief Narrative: HPI on 01/07/2016 by Dr. Gilman Schmidt [name] a 75 y.o.femalewith medical history significant of ulcerative colitis versus Crohn's disease, believed to be UC eventually,otherwise healthy, presents to the emergency room with chief complaint of severe abdominal pain for the past several days. She was recently hospitalized and discharged home about 9 days prior to admission. She was here in the hospital with similar complaints, placed on steroids, and tells me today that she improved some and really wanted to go home. At home, she has had ongoing symptoms, never really well achieved full remission, and over the last few days she has felt extremely unwell. She has crampy abdominal pain, nausea, poor appetite, barely able to keep down some Gatorade, and has been having significant amounts of bloody bowel movements as well. She has no fever or chills. She has no chest pain or shortness of breath. She denies any palpitations. She has no lightheadedness or dizziness. She feels generalized weakness. In the emergency room her vital signs are stable, her blood work is essentially unremarkable. Gastroenterology was consulted by EDP and have evaluated patient, and [facility] was asked for admission  Interim history Gastroenterology consultation evaluated the patient. She underwent flexible sigmoidoscopy. CMV was negative and she was started on Remicade. She was initially doing better and tolerating some diet and since still having a lot of pain and frequent Bms she was dosed with another Remicaide infusion on 9/17. On 9/21 pain and bloody clots worsened overnight. CT abd/pelvis showed perirectal abscess- General surgery was consulted. Patient underwent I&D on 9/26. Steroids were decreased per GI recommendations. Patient received dose of amikacin on 01/28/2016. Clinically continuing  to improve. Patient is working with physical therapy  UC vs crohn's flare, perirectal abscess s/p drain,  S/p remicade, Currently on IV Solu-Medrol, cipro and flagyl General surgery and GI following for possible diverting colostomy and eventually like discharge to skilled nursing facility  Assessment & Plan:   # Abdominal pain/diarrhea secondary to Ulcerative colitis flare with rectal bleeding/ Perianal abscess -Patient is s/p flexible sigmoidoscopy on 01/08/16, biopsy with findings of active erosive ulcer. No CMV found. Has been given Remicade on 01/09/2016, 01/13/16, and 01/28/2016.Patient is status post I&D of abscess -c diff test negative x2, she was treated with zosynx 1week ,then oral cipro and flagyl, - Currently on ciprofloxacin, IV solumedrol and tinidazole by Gi.  -plan for surgery on Monday (10/16) as per GI and surgery team.  Continue to provide supportive care and follow up consultant's recommendation. No new event today.  # Acute blood loss anemia: Hb 10.3 today.Off heparin sq.  -She received blood transfusion during this hospitalization. -intermittent labs  #Pain management: Patient is currently on opana ER 10 mg bid, dilaudid prn and norco. Trying to taper the dose of dilaudid if possible.  -pain seems adequately controlled.  # Physical deconditioning: PT, OT evaluation and treatment.  #Hypokalemia: Monitor labs intermittently. K 4.1 today.  Continue current medical and supportive care. GI and general surgery consult appreciated.  DVT prophylaxis: SCD and early ambulation. Discontinued heparin because of bleeding from peri-anal wound, stopped now. Code Status: Full code Family Communication: no family present at bedside today. Disposition Plan: Likely discharge to rehabilitation versus SNF in 3-4 days depending on GI and surgery's plan    Consultants: GI and general surgery  Antimicrobials: Cipro and tinidazole.   Subjective: Patient was  seen and examined at  bedside. No new event. Patient is lying comfortably. Had a bowel movement today. Able to eat. Reported that her pain is controlled with the current medications. Denied headache, dizziness, nausea, vomiting, abdominal pain, chest pain or shortness of breath. Objective: Vitals:   02/09/16 0704 02/09/16 1447 02/09/16 2114 02/10/16 0657  BP: 139/73 110/67 (!) 109/56 118/61  Pulse: 64 67 64 80  Resp: 17 18 18 18   Temp: 97.1 F (36.2 C) 97.6 F (36.4 C) 97.5 F (36.4 C) 97.9 F (36.6 C)  TempSrc: Axillary Axillary Axillary Oral  SpO2: 99% 99% 97% 100%  Weight:      Height:        Intake/Output Summary (Last 24 hours) at 02/10/16 1209 Last data filed at 02/09/16 1700  Gross per 24 hour  Intake              120 ml  Output                0 ml  Net              120 ml   Filed Weights   01/07/16 1136 02/08/16 1416  Weight: 72.6 kg (160 lb) 67.3 kg (148 lb 4.8 oz)    Examination:  General exam:Not in distress, lying on bed comfortably. Respiratory system: Clear to auscultation bilateral, no wheezing or crackle Cardiovascular system: Regular rate rhythm, S1-S2 normal, no murmur appreciated. Gastrointestinal system: Abdomen soft, nontender, nondistended. Bowel sound positive. Gauze around the perianal area. No sign of bleeding noticed. Central nervous system: Alert, awake, oriented. No focal neurological deficit.Marland Kitchen Extremities: Symmetric 5 x 5 power. Skin: No rashes, lesions or ulcers Psychiatry: Judgement and insight appear normal. Mood & affect appropriate.     Data Reviewed: I have personally reviewed following labs and imaging studies  CBC:  Recent Labs Lab 02/04/16 0519 02/05/16 0523 02/06/16 0541 02/07/16 0544 02/10/16 0923  WBC 7.3 5.7 4.8 4.6 7.4  NEUTROABS 5.2 3.7 2.9 2.6  --   HGB 8.8* 8.3* 8.1* 8.4* 10.3*  HCT 27.8* 26.0* 25.4* 26.3* 32.1*  MCV 91.7 91.9 91.7 92.0 89.4  PLT 176 189 196 206 709   Basic Metabolic Panel:  Recent Labs Lab 02/04/16 0519  02/08/16 0627 02/10/16 0923  NA 138 137 134*  K 3.6 3.3* 4.1  CL 103 106 97*  CO2 28 28 29   GLUCOSE 83 99 85  BUN <5* 5* 7  CREATININE 0.44 0.47 0.65  CALCIUM 8.2* 7.9* 8.8*  MG 1.8  --  2.1   GFR: Estimated Creatinine Clearance: 57.8 mL/min (by C-G formula based on SCr of 0.65 mg/dL). Liver Function Tests:  Recent Labs Lab 02/08/16 0627  AST 16  ALT 16  ALKPHOS 54  BILITOT 0.5  PROT 4.5*  ALBUMIN 2.1*   No results for input(s): LIPASE, AMYLASE in the last 168 hours. No results for input(s): AMMONIA in the last 168 hours. Coagulation Profile: No results for input(s): INR, PROTIME in the last 168 hours. Cardiac Enzymes: No results for input(s): CKTOTAL, CKMB, CKMBINDEX, TROPONINI in the last 168 hours. BNP (last 3 results) No results for input(s): PROBNP in the last 8760 hours. HbA1C: No results for input(s): HGBA1C in the last 72 hours. CBG: No results for input(s): GLUCAP in the last 168 hours. Lipid Profile: No results for input(s): CHOL, HDL, LDLCALC, TRIG, CHOLHDL, LDLDIRECT in the last 72 hours. Thyroid Function Tests: No results for input(s): TSH, T4TOTAL, FREET4, T3FREE, THYROIDAB in the last 72  hours. Anemia Panel: No results for input(s): VITAMINB12, FOLATE, FERRITIN, TIBC, IRON, RETICCTPCT in the last 72 hours. Sepsis Labs: No results for input(s): PROCALCITON, LATICACIDVEN in the last 168 hours.  No results found for this or any previous visit (from the past 240 hour(s)).       Radiology Studies: No results found.      Scheduled Meds: . ciprofloxacin  500 mg Oral BID  . dicyclomine  10 mg Oral TID AC & HS  . feeding supplement (ENSURE ENLIVE)  237 mL Oral Q24H  . fluconazole  200 mg Oral Once  . FLUoxetine  40 mg Oral Daily  . methylPREDNISolone (SOLU-MEDROL) injection  30 mg Intravenous Daily  . metroNIDAZOLE  500 mg Oral Q8H  . midazolam  2 mg Intravenous Once  . neomycin  1,000 mg Oral TID  . oxymorphone  10 mg Oral QAC breakfast    . oxymorphone  10 mg Oral QHS  . potassium chloride  40 mEq Oral Daily  . [START ON 02/11/2016] tinidazole  500 mg Oral BID   Continuous Infusions:     LOS: 33 days    Time spent:18 minutes.    Dron Tanna Furry, MD [facility] Pager [phone]  If 7PM-7AM, please contact night-coverage [email or web address] Password [email or web address] 02/10/2016, 12:09 PM

## 2016-02-11 ENCOUNTER — Inpatient Hospital Stay (HOSPITAL_COMMUNITY): Payer: Commercial Managed Care - HMO | Admitting: Anesthesiology

## 2016-02-11 ENCOUNTER — Encounter (HOSPITAL_COMMUNITY): Payer: Self-pay | Admitting: Anesthesiology

## 2016-02-11 ENCOUNTER — Encounter (HOSPITAL_COMMUNITY)
Admission: EM | Disposition: A | Discharge status: Skilled Nursing Facility | Payer: Self-pay | Source: Home / Self Care | Attending: Nephrology

## 2016-02-11 DIAGNOSIS — K51014 Ulcerative (chronic) pancolitis with abscess: Secondary | ICD-10-CM

## 2016-02-11 DIAGNOSIS — Z932 Ileostomy status: Secondary | ICD-10-CM

## 2016-02-11 HISTORY — PX: ILEOSTOMY: SHX1783

## 2016-02-11 HISTORY — PX: LAPAROSCOPIC DIVERTED COLOSTOMY: SHX5892

## 2016-02-11 LAB — CBC
HCT: 32.3 % — ABNORMAL LOW (ref 36.0–46.0)
Hemoglobin: 10.6 g/dL — ABNORMAL LOW (ref 12.0–15.0)
MCH: 29.7 pg (ref 26.0–34.0)
MCHC: 32.8 g/dL (ref 30.0–36.0)
MCV: 90.5 fL (ref 78.0–100.0)
PLATELETS: 269 10*3/uL (ref 150–400)
RBC: 3.57 MIL/uL — AB (ref 3.87–5.11)
RDW: 15.4 % (ref 11.5–15.5)
WBC: 12.9 10*3/uL — AB (ref 4.0–10.5)

## 2016-02-11 LAB — BASIC METABOLIC PANEL
ANION GAP: 8 (ref 5–15)
BUN: 7 mg/dL (ref 6–20)
CHLORIDE: 98 mmol/L — AB (ref 101–111)
CO2: 29 mmol/L (ref 22–32)
Calcium: 8.3 mg/dL — ABNORMAL LOW (ref 8.9–10.3)
Creatinine, Ser: 0.67 mg/dL (ref 0.44–1.00)
GFR calc Af Amer: >60 mL/min (ref >60)
GLUCOSE: 115 mg/dL — AB (ref 65–99)
POTASSIUM: 4.3 mmol/L (ref 3.5–5.1)
Sodium: 135 mmol/L (ref 135–145)

## 2016-02-11 LAB — GLUCOSE, CAPILLARY
GLUCOSE-CAPILLARY: 115 mg/dL — AB (ref 65–99)
Glucose-Capillary: 130 mg/dL — ABNORMAL HIGH (ref 65–99)
Glucose-Capillary: 131 mg/dL — ABNORMAL HIGH (ref 65–99)

## 2016-02-11 SURGERY — CREATION, COLOSTOMY, DIVERTING, LAPAROSCOPIC
Anesthesia: General

## 2016-02-11 MED ORDER — FAMOTIDINE IN NACL 20-0.9 MG/50ML-% IV SOLN: 20.0000 mg | Freq: Two times a day (BID) | INTRAVENOUS | Status: DC

## 2016-02-11 MED ORDER — FENTANYL CITRATE (PF) 100 MCG/2ML IJ SOLN
INTRAMUSCULAR | Status: AC
  Administered 2016-02-11: 14:00:00
  Filled 2016-02-11: qty 2

## 2016-02-11 MED ORDER — ONDANSETRON 4 MG PO TBDP: 4.0000 mg | ORAL_TABLET | Freq: Four times a day (QID) | ORAL | Status: DC | PRN

## 2016-02-11 MED ORDER — FAMOTIDINE IN NACL 20-0.9 MG/50ML-% IV SOLN
20.0000 mg | Freq: Two times a day (BID) | INTRAVENOUS | Status: DC
  Administered 2016-02-11 – 2016-02-13 (×5): 20 mg via INTRAVENOUS
  Filled 2016-02-11 (×5): qty 50

## 2016-02-11 MED ORDER — DEXAMETHASONE SODIUM PHOSPHATE 10 MG/ML IJ SOLN
INTRAMUSCULAR | Status: DC | PRN
  Administered 2016-02-11: 10 mg via INTRAVENOUS

## 2016-02-11 MED ORDER — ONDANSETRON HCL 4 MG/2ML IJ SOLN: 4.0000 mg | Freq: Once | INTRAMUSCULAR | Status: DC | PRN

## 2016-02-11 MED ORDER — INSULIN ASPART 100 UNIT/ML ~~LOC~~ SOLN
0.0000 [IU] | Freq: Three times a day (TID) | SUBCUTANEOUS | Status: DC
  Administered 2016-02-11: 1 [IU] via SUBCUTANEOUS

## 2016-02-11 MED ORDER — EPHEDRINE SULFATE 50 MG/ML IJ SOLN
INTRAMUSCULAR | Status: DC | PRN
  Administered 2016-02-11: 5 mg via INTRAVENOUS

## 2016-02-11 MED ORDER — BUPIVACAINE HCL (PF) 0.25 % IJ SOLN
INTRAMUSCULAR | Status: AC
  Filled 2016-02-11: qty 30

## 2016-02-11 MED ORDER — LACTATED RINGERS IV SOLN
INTRAVENOUS | Status: DC | PRN
  Administered 2016-02-11 (×2): via INTRAVENOUS

## 2016-02-11 MED ORDER — ONDANSETRON HCL 4 MG/2ML IJ SOLN
4.0000 mg | Freq: Four times a day (QID) | INTRAMUSCULAR | Status: DC | PRN
  Administered 2016-02-15 – 2016-02-21 (×6): 4 mg via INTRAVENOUS
  Filled 2016-02-11 (×3): qty 2

## 2016-02-11 MED ORDER — ROCURONIUM BROMIDE 50 MG/5ML IV SOSY
PREFILLED_SYRINGE | INTRAVENOUS | Status: AC
  Filled 2016-02-11: qty 5

## 2016-02-11 MED ORDER — PROPOFOL 10 MG/ML IV BOLUS
INTRAVENOUS | Status: AC
  Filled 2016-02-11: qty 60

## 2016-02-11 MED ORDER — DEXAMETHASONE SODIUM PHOSPHATE 10 MG/ML IJ SOLN
INTRAMUSCULAR | Status: AC
  Filled 2016-02-11: qty 1

## 2016-02-11 MED ORDER — HEPARIN SODIUM (PORCINE) 5000 UNIT/ML IJ SOLN
5000.0000 [IU] | Freq: Three times a day (TID) | INTRAMUSCULAR | Status: DC
  Administered 2016-02-11 – 2016-02-29 (×53): 5000 [IU] via SUBCUTANEOUS
  Filled 2016-02-11 (×53): qty 1

## 2016-02-11 MED ORDER — KCL IN DEXTROSE-NACL 20-5-0.45 MEQ/L-%-% IV SOLN
INTRAVENOUS | Status: DC
  Administered 2016-02-11 – 2016-02-13 (×2): via INTRAVENOUS
  Filled 2016-02-11 (×5): qty 1000

## 2016-02-11 MED ORDER — ROCURONIUM BROMIDE 50 MG/5ML IV SOSY
PREFILLED_SYRINGE | INTRAVENOUS | Status: AC
  Filled 2016-02-11: qty 15

## 2016-02-11 MED ORDER — ROCURONIUM BROMIDE 100 MG/10ML IV SOLN
INTRAVENOUS | Status: DC | PRN
  Administered 2016-02-11: 40 mg via INTRAVENOUS
  Administered 2016-02-11 (×3): 10 mg via INTRAVENOUS

## 2016-02-11 MED ORDER — MESALAMINE 1000 MG RE SUPP
1000.0000 mg | Freq: Every day | RECTAL | Status: DC
  Administered 2016-02-13 – 2016-02-16 (×2): 1000 mg via RECTAL
  Filled 2016-02-11 (×18): qty 1

## 2016-02-11 MED ORDER — FENTANYL CITRATE (PF) 250 MCG/5ML IJ SOLN
INTRAMUSCULAR | Status: AC
  Filled 2016-02-11: qty 5

## 2016-02-11 MED ORDER — PREDNISONE 20 MG PO TABS
40.0000 mg | ORAL_TABLET | Freq: Every day | ORAL | Status: DC
Start: 2016-02-12 — End: 2016-02-11

## 2016-02-11 MED ORDER — HYDROCORTISONE NA SUCCINATE PF 100 MG IJ SOLR
100.0000 mg | INTRAMUSCULAR | Status: AC
  Administered 2016-02-11: 100 mg via INTRAVENOUS
  Filled 2016-02-11: qty 2

## 2016-02-11 MED ORDER — ONDANSETRON HCL 4 MG/2ML IJ SOLN
INTRAMUSCULAR | Status: AC
  Filled 2016-02-11: qty 2

## 2016-02-11 MED ORDER — PIPERACILLIN-TAZOBACTAM 3.375 G IVPB 30 MIN
3.3750 g | Freq: Three times a day (TID) | INTRAVENOUS | Status: AC
  Administered 2016-02-11: 3.375 g via INTRAVENOUS
  Filled 2016-02-11: qty 50

## 2016-02-11 MED ORDER — LIDOCAINE HCL (CARDIAC) 20 MG/ML IV SOLN
INTRAVENOUS | Status: DC | PRN
  Administered 2016-02-11: 50 mg via INTRAVENOUS

## 2016-02-11 MED ORDER — SUGAMMADEX SODIUM 200 MG/2ML IV SOLN
INTRAVENOUS | Status: DC | PRN
  Administered 2016-02-11: 200 mg via INTRAVENOUS

## 2016-02-11 MED ORDER — FENTANYL CITRATE (PF) 100 MCG/2ML IJ SOLN
12.5000 ug | INTRAMUSCULAR | Status: DC | PRN
  Administered 2016-02-11 – 2016-02-14 (×18): 12.5 ug via INTRAVENOUS
  Filled 2016-02-11 (×18): qty 2

## 2016-02-11 MED ORDER — FENTANYL CITRATE (PF) 100 MCG/2ML IJ SOLN
25.0000 ug | INTRAMUSCULAR | Status: DC | PRN
  Administered 2016-02-11 (×2): 50 ug via INTRAVENOUS

## 2016-02-11 MED ORDER — PIPERACILLIN-TAZOBACTAM 3.375 G IVPB
INTRAVENOUS | Status: AC
  Filled 2016-02-11: qty 50

## 2016-02-11 MED ORDER — FENTANYL CITRATE (PF) 100 MCG/2ML IJ SOLN
INTRAMUSCULAR | Status: DC | PRN
  Administered 2016-02-11: 25 ug via INTRAVENOUS
  Administered 2016-02-11: 50 ug via INTRAVENOUS
  Administered 2016-02-11: 75 ug via INTRAVENOUS

## 2016-02-11 MED ORDER — HYDROCORTISONE ACETATE 25 MG RE SUPP
25.0000 mg | Freq: Every day | RECTAL | Status: DC
  Filled 2016-02-11 (×19): qty 1

## 2016-02-11 MED ORDER — PROPOFOL 10 MG/ML IV BOLUS
INTRAVENOUS | Status: DC | PRN
Start: 2016-02-11 — End: 2016-02-11
  Administered 2016-02-11: 110 mg via INTRAVENOUS

## 2016-02-11 MED ORDER — SUGAMMADEX SODIUM 200 MG/2ML IV SOLN
INTRAVENOUS | Status: AC
  Filled 2016-02-11: qty 2

## 2016-02-11 MED ORDER — SODIUM CHLORIDE 0.9 % IJ SOLN
INTRAMUSCULAR | Status: AC
  Filled 2016-02-11: qty 50

## 2016-02-11 MED ORDER — EPHEDRINE 5 MG/ML INJ
INTRAVENOUS | Status: AC
  Filled 2016-02-11: qty 10

## 2016-02-11 MED ORDER — LIDOCAINE 2% (20 MG/ML) 5 ML SYRINGE
INTRAMUSCULAR | Status: AC
  Filled 2016-02-11: qty 10

## 2016-02-11 MED ORDER — SODIUM CHLORIDE 0.9 % IV SOLN: INTRAVENOUS | Status: DC

## 2016-02-11 SURGICAL SUPPLY — 62 items
APPLIER CLIP 5 13 M/L LIGAMAX5 (MISCELLANEOUS)
APPLIER CLIP ROT 10 11.4 M/L (STAPLE)
APR CLP MED LRG 11.4X10 (STAPLE)
APR CLP MED LRG 5 ANG JAW (MISCELLANEOUS)
BLADE EXTENDED COATED 6.5IN (ELECTRODE) IMPLANT
CABLE HIGH FREQUENCY MONO STRZ (ELECTRODE) ×3 IMPLANT
CELLS DAT CNTRL 66122 CELL SVR (MISCELLANEOUS) IMPLANT
CHLORAPREP W/TINT 26ML (MISCELLANEOUS) IMPLANT
CLIP APPLIE 5 13 M/L LIGAMAX5 (MISCELLANEOUS) IMPLANT
CLIP APPLIE ROT 10 11.4 M/L (STAPLE) IMPLANT
COUNTER NEEDLE 20 DBL MAG RED (NEEDLE) IMPLANT
COVER MAYO STAND STRL (DRAPES) IMPLANT
COVER SURGICAL LIGHT HANDLE (MISCELLANEOUS) IMPLANT
DECANTER SPIKE VIAL GLASS SM (MISCELLANEOUS) IMPLANT
DRAPE LAPAROSCOPIC ABDOMINAL (DRAPES) ×3 IMPLANT
DRSG OPSITE POSTOP 4X10 (GAUZE/BANDAGES/DRESSINGS) IMPLANT
DRSG OPSITE POSTOP 4X6 (GAUZE/BANDAGES/DRESSINGS) IMPLANT
DRSG OPSITE POSTOP 4X8 (GAUZE/BANDAGES/DRESSINGS) IMPLANT
ELECT REM PT RETURN 15FT ADLT (MISCELLANEOUS) IMPLANT
GAUZE SPONGE 4X4 12PLY STRL (GAUZE/BANDAGES/DRESSINGS) IMPLANT
GLOVE BIOGEL M 8.0 STRL (GLOVE) ×24 IMPLANT
GOWN STRL REUS W/TWL XL LVL3 (GOWN DISPOSABLE) ×9 IMPLANT
HANDLE STAPLE EGIA 4 XL (STAPLE) ×3 IMPLANT
IRRIG SUCT STRYKERFLOW 2 WTIP (MISCELLANEOUS)
IRRIGATION SUCT STRKRFLW 2 WTP (MISCELLANEOUS) IMPLANT
LEGGING LITHOTOMY PAIR STRL (DRAPES) ×3 IMPLANT
LIQUID BAND (GAUZE/BANDAGES/DRESSINGS) ×3 IMPLANT
PACK COLON (CUSTOM PROCEDURE TRAY) ×3 IMPLANT
PAD POSITIONING PINK XL (MISCELLANEOUS) ×3 IMPLANT
PORT LAP GEL ALEXIS MED 5-9CM (MISCELLANEOUS) IMPLANT
POSITIONER SURGICAL ARM (MISCELLANEOUS) IMPLANT
RELOAD EGIA 60 TAN VASC (STAPLE) ×3 IMPLANT
RTRCTR WOUND ALEXIS 18CM MED (MISCELLANEOUS)
SCISSORS LAP 5X45 EPIX DISP (ENDOMECHANICALS) IMPLANT
SEALER TISSUE X1 CVD JAW (INSTRUMENTS) IMPLANT
SHEARS CURVED HARMONIC AC 45CM (MISCELLANEOUS) IMPLANT
SHEARS HARMONIC ACE PLUS 36CM (ENDOMECHANICALS) ×3 IMPLANT
SLEEVE XCEL OPT CAN 5 100 (ENDOMECHANICALS) ×6 IMPLANT
STAPLER VISISTAT 35W (STAPLE) IMPLANT
SUT CHROMIC 3 0 SH 27 (SUTURE) ×6 IMPLANT
SUT MNCRL AB 4-0 PS2 18 (SUTURE) ×3 IMPLANT
SUT PDS AB 1 CTX 36 (SUTURE) IMPLANT
SUT PDS AB 1 TP1 96 (SUTURE) IMPLANT
SUT PDS AB 4-0 SH 27 (SUTURE) IMPLANT
SUT PROLENE 2 0 KS (SUTURE) IMPLANT
SUT SILK 2 0 (SUTURE)
SUT SILK 2 0 SH CR/8 (SUTURE) IMPLANT
SUT SILK 2-0 18XBRD TIE 12 (SUTURE) IMPLANT
SUT SILK 3 0 (SUTURE)
SUT SILK 3 0 SH CR/8 (SUTURE) ×3 IMPLANT
SUT SILK 3-0 18XBRD TIE 12 (SUTURE) IMPLANT
SUT VIC AB 4-0 SH 18 (SUTURE) IMPLANT
SYS LAPSCP GELPORT 120MM (MISCELLANEOUS)
SYSTEM LAPSCP GELPORT 120MM (MISCELLANEOUS) IMPLANT
TOWEL OR NON WOVEN STRL DISP B (DISPOSABLE) IMPLANT
TRAY FOLEY W/METER SILVER 16FR (SET/KITS/TRAYS/PACK) ×3 IMPLANT
TROCAR BLADELESS OPT 5 100 (ENDOMECHANICALS) ×3 IMPLANT
TROCAR XCEL 12X100 BLDLESS (ENDOMECHANICALS) ×3 IMPLANT
TROCAR XCEL NON-BLD 11X100MML (ENDOMECHANICALS) IMPLANT
TUBING CONNECTING 10 (TUBING) IMPLANT
TUBING CONNECTING 10' (TUBING)
TUBING INSUF HEATED (TUBING) ×3 IMPLANT

## 2016-02-11 NOTE — Brief Op Note (Signed)
01/07/2016 - 02/11/2016  12:31 PM  PATIENT:  Sara Barnes  75 y.o. female  PRE-OPERATIVE DIAGNOSIS:  ULCERTIVE PANCOLITIS WITH ABSCES  POST-OPERATIVE DIAGNOSIS:  crohns colitis  PROCEDURE:  Procedure(s): LAPAROSCOPIC DIVERTED ILEOSTOMY (N/A)  SURGEON:  Surgeon(s) and Role:    * Johnathan Hausen, MD - Primary  PHYSICIAN ASSISTANT:   ASSISTANTS: none   ANESTHESIA:   general  EBL:  Total I/O In: 1000 [I.V.:1000] Out: -   BLOOD ADMINISTERED:none  DRAINS: none   LOCAL MEDICATIONS USED:  NONE  SPECIMEN:  No Specimen  DISPOSITION OF SPECIMEN:  N/A  COUNTS:  YES  TOURNIQUET:  * No tourniquets in log *  DICTATION: .Other Dictation: Dictation Number [id number]  PLAN OF CARE: already admitted  PATIENT DISPOSITION:  PACU - hemodynamically stable.   Delay start of Pharmacological VTE agent (>24hrs) due to surgical blood loss or risk of bleeding: yes

## 2016-02-11 NOTE — Progress Notes (Signed)
    Progress Note   Subjective  Chief Complaint: Crohn's colitis, treated by perirectal abscess and fistula status post incision and drainage with packing complex perirectal abscess  Patient is found laying in bed this morning, she does have some questions regarding her consent sheet stating that she is to have a "diverting end ileostomy". She is surrounded by her son and sister who are there for support. Patient will be brought down to surgery later this morning for diverting ileostomy. She denies any new complaints or concerns and is "ready to get this done".    Objective   Vital signs in last 24 hours: Temp:  [97.5 F (36.4 C)-98 F (36.7 C)] 97.5 F (36.4 C) (10/16 0656) Pulse Rate:  [64-88] 88 (10/16 0656) Resp:  [16] 16 (10/16 0656) BP: (96-115)/(61-77) 115/77 (10/16 0656) SpO2:  [97 %-98 %] 97 % (10/16 0656) Last BM Date: 02/10/16 General:   Caucasian female in NAD Heart:  Regular rate and rhythm; no murmurs Lungs: Respirations even and unlabored, lungs CTA bilaterally Abdomen:  Soft, mild generalized tenderness and nondistended. Normal bowel sounds. Extremities:  Without edema. Neurologic:  Alert and oriented,  grossly normal neurologically. Psych:  Cooperative. Normal mood and affect.  Lab Results:  Recent Labs  02/10/16 0923  WBC 7.4  HGB 10.3*  HCT 32.1*  PLT 277   BMET  Recent Labs  02/10/16 0923  NA 134*  K 4.1  CL 97*  CO2 29  GLUCOSE 85  BUN 7  CREATININE 0.65  CALCIUM 8.8*   LFT No results for input(s): PROT, ALBUMIN, AST, ALT, ALKPHOS, BILITOT, BILIDIR, IBILI in the last 72 hours. PT/INR No results for input(s): LABPROT, INR in the last 72 hours.  Studies/Results: No results found.     Assessment / Plan:   Assessment: 44. 75 year old female with Crohn's colitis complicated by perirectal abscess and fistula status post incision and drainage with packing of complex perirectal abscess, packing was removed next day and currently has a drain  in place.She received second induction dose of Remicade on 01/28/2016, may need IV methyl prednisone 60 mg x 1 dose with next induction dose of Remicade for possible reaction to the last infusion.Patient is proceeding to surgery this morning for diverting end ileostomy.  Plan: 1. Spent time discussing with the patient that her consent form does have the right procedure written. We did request surgical consult regarding a diverting end ileostomy after discussion with an IBD expert last week. She verbalized understanding. 2. Patient is prepared for surgery this morning with her sister and son by her side 3. Will discuss above with Dr. Havery Moros, please await further recommendations  Thank you for your kind consult, we will continue to follow   LOS: 34 days   Levin Erp  02/11/2016, 9:52 AM  Pager # [phone]

## 2016-02-11 NOTE — Anesthesia Preprocedure Evaluation (Signed)
Anesthesia Evaluation  Patient identified by MRN, date of birth, ID band Patient awake    Reviewed: Allergy & Precautions, NPO status , Patient's Chart, lab work & pertinent test results  History of Anesthesia Complications (+) PONV and history of anesthetic complications  Airway Mallampati: II  TM Distance: >3 FB Neck ROM: Full    Dental no notable dental hx. (+) Dental Advisory Given   Pulmonary neg pulmonary ROS,    Pulmonary exam normal breath sounds clear to auscultation       Cardiovascular negative cardio ROS Normal cardiovascular exam Rhythm:Regular Rate:Normal     Neuro/Psych PSYCHIATRIC DISORDERS Anxiety Depression negative neurological ROS     GI/Hepatic Neg liver ROS, Crohns   Endo/Other  negative endocrine ROS  Renal/GU negative Renal ROS  negative genitourinary   Musculoskeletal negative musculoskeletal ROS (+)   Abdominal   Peds negative pediatric ROS (+)  Hematology negative hematology ROS (+)   Anesthesia Other Findings   Reproductive/Obstetrics negative OB ROS                             Anesthesia Physical Anesthesia Plan  ASA: II  Anesthesia Plan: General   Post-op Pain Management:    Induction: Intravenous  Airway Management Planned: Oral ETT  Additional Equipment:   Intra-op Plan:   Post-operative Plan: Extubation in OR  Informed Consent: I have reviewed the patients History and Physical, chart, labs and discussed the procedure including the risks, benefits and alternatives for the proposed anesthesia with the patient or authorized representative who has indicated his/her understanding and acceptance.   Dental advisory given  Plan Discussed with: CRNA  Anesthesia Plan Comments:         Anesthesia Quick Evaluation

## 2016-02-11 NOTE — Transfer of Care (Signed)
Immediate Anesthesia Transfer of Care Note  Patient: Sara Barnes  Procedure(s) Performed: Procedure(s): LAPAROSCOPIC DIVERTED ILEOSTOMY (N/A)  Patient Location: PACU  Anesthesia Type:General  Level of Consciousness:  sedated, patient cooperative and responds to stimulation  Airway & Oxygen Therapy:Patient Spontanous Breathing and Patient connected to face mask oxgen  Post-op Assessment:  Report given to PACU RN and Post -op Vital signs reviewed and stable  Post vital signs:  Reviewed and stable  Last Vitals:  Vitals:   02/11/16 0656 02/11/16 1245  BP: 115/77 (!) 124/112  Pulse: 88 85  Resp: 16 14  Temp: 36.4 C 70.9 C    Complications: No apparent anesthesia complications

## 2016-02-11 NOTE — Progress Notes (Signed)
OT Cancellation Note  Patient Details Name: Sara Barnes MRN: [id number] DOB: 10-15-1940   Cancelled Treatment:    Reason Eval/Treat Not Completed: Other (comment) -- Note pt NPO for OR procedure today. Will follow up tomorrow.  Jed Kutch A 02/11/2016, 9:06 AM

## 2016-02-11 NOTE — Progress Notes (Addendum)
PROGRESS NOTE    Sara Barnes  OZH:[id number] DOB: 23-Nov-1940 DOA: 01/07/2016 PCP: Sara Held, DO   Brief Narrative: HPI on 01/07/2016 by Dr. Gilman Schmidt [name] a 75 y.o.femalewith medical history significant of ulcerative colitis versus Crohn's disease, believed to be UC eventually,otherwise healthy, presents to the emergency room with chief complaint of severe abdominal pain for the past several days. She was recently hospitalized and discharged home about 9 days prior to admission. She was here in the hospital with similar complaints, placed on steroids, and tells me today that she improved some and really wanted to go home. At home, she has had ongoing symptoms, never really well achieved full remission, and over the last few days she has felt extremely unwell. She has crampy abdominal pain, nausea, poor appetite, barely able to keep down some Gatorade, and has been having significant amounts of bloody bowel movements as well. She has no fever or chills. She has no chest pain or shortness of breath. She denies any palpitations. She has no lightheadedness or dizziness. She feels generalized weakness. In the emergency room her vital signs are stable, her blood work is essentially unremarkable. Gastroenterology was consulted by EDP and have evaluated patient, and TRH was asked for admission.   Interim history Gastroenterology consultation evaluated the patient. She underwent flexible sigmoidoscopy. CMV was negative and she was started on Remicade. She was initially doing better and tolerating some diet and since still having a lot of pain and frequent Bms she was dosed with another Remicaide infusion on 9/17. On 9/21 pain and bloody clots worsened overnight. CT abd/pelvis showed perirectal abscess- General surgery was consulted. Patient underwent I&D on 9/26. Steroids were decreased per GI recommendations. Patient received dose of amikacin on 01/28/2016. Clinically continuing  to improve. Patient is working with physical therapy  UC vs crohn's flare, perirectal abscess s/p drain,  S/p remicade, Currently on IV Solu-Medrol, cipro and tinidazole.  General surgery and GI following, plan for diverting end ileostomy today.  Assessment & Plan:   # Abdominal pain/diarrhea secondary to Ulcerative colitis flare with rectal bleeding/ Perianal abscess -Patient is s/p flexible sigmoidoscopy on 01/08/16, biopsy with findings of active erosive ulcer. No CMV found. Has been given Remicade on 01/09/2016, 01/13/16, and 01/28/2016.Patient is status post I&D of abscess - Currently on ciprofloxacin, IV solumedrol and tinidazole by Gi.  -plan for surgery today for diverting end ileostomy. -start IVF and added pepcid while NPO. Monitor blood sugar level especially while NPO. Discussed with RN. Continue to provide supportive care and follow up consultant's recommendation.  # Acute blood loss anemia: Hb 10.3 on 10/15.  -She received blood transfusion during this hospitalization. -repeat labs in am/  #Pain management: Patient is currently on opana ER 10 mg bid, dilaudid prn and norco. Trying to taper the dose of dilaudid if possible.  -pain seems adequately controlled.  # Physical deconditioning: PT, OT evaluation and treatment.  #Hypokalemia: improved. Monitor labs intermittently.   Continue current medical and supportive care. GI and general surgery consult appreciated.  DVT prophylaxis: SCD and early ambulation. No AC because of bleeding risk and plan for surgery today, Code Status: Full code Family Communication: patient's sister and son presented at bedside.  Disposition Plan: Likely discharge to rehabilitation versus SNF in 1-2 days depending on GI and surgery's plan    Consultants: GI and general surgery  Antimicrobials: Cipro and tinidazole.   Subjective: Patient was seen and examined at bedside. Denies pain, nausea, vomiting,  chest pain or shortness of breath. Patient  reported ready to go for surgery today. Her sister and son at bedside. Patient denies any needs.  Objective: Vitals:   02/09/16 2114 02/10/16 0657 02/10/16 2020 02/11/16 0656  BP: (!) 109/56 118/61 96/61 115/77  Pulse: 64 80 64 88  Resp: 18 18 16 16   Temp: 97.5 F (36.4 C) 97.9 F (36.6 C) 98 F (36.7 C) 97.5 F (36.4 C)  TempSrc: Axillary Oral Oral Oral  SpO2: 97% 100% 98% 97%  Weight:      Height:       No intake or output data in the 24 hours ending 02/11/16 1204 Filed Weights   01/07/16 1136 02/08/16 1416  Weight: 72.6 kg (160 lb) 67.3 kg (148 lb 4.8 oz)    Examination:  General exam:Not in distress, lying comfortably on bed. Respiratory system: Clear to auscultation bilateral, no wheezing or crackles appreciated Cardiovascular system: Regular rate rhythm, S1-S2 normal. No murmur appreciated. Gastrointestinal system: Abdomen soft, nontender, nondistended. Bowel sound positive.  Central nervous system: Alert, awake, oriented and following commands. Extremities: Symmetric 5 x 5 power. Skin: No rashes, lesions or ulcers Psychiatry: Judgement and insight appear normal. Mood & affect appropriate.     Data Reviewed: I have personally reviewed following labs and imaging studies  CBC:  Recent Labs Lab 02/05/16 0523 02/06/16 0541 02/07/16 0544 02/10/16 0923  WBC 5.7 4.8 4.6 7.4  NEUTROABS 3.7 2.9 2.6  --   HGB 8.3* 8.1* 8.4* 10.3*  HCT 26.0* 25.4* 26.3* 32.1*  MCV 91.9 91.7 92.0 89.4  PLT 189 196 206 174   Basic Metabolic Panel:  Recent Labs Lab 02/08/16 0627 02/10/16 0923  NA 137 134*  K 3.3* 4.1  CL 106 97*  CO2 28 29  GLUCOSE 99 85  BUN 5* 7  CREATININE 0.47 0.65  CALCIUM 7.9* 8.8*  MG  --  2.1   GFR: Estimated Creatinine Clearance: 57.8 mL/min (by C-G formula based on SCr of 0.65 mg/dL). Liver Function Tests:  Recent Labs Lab 02/08/16 0627  AST 16  ALT 16  ALKPHOS 54  BILITOT 0.5  PROT 4.5*  ALBUMIN 2.1*   No results for input(s):  LIPASE, AMYLASE in the last 168 hours. No results for input(s): AMMONIA in the last 168 hours. Coagulation Profile: No results for input(s): INR, PROTIME in the last 168 hours. Cardiac Enzymes: No results for input(s): CKTOTAL, CKMB, CKMBINDEX, TROPONINI in the last 168 hours. BNP (last 3 results) No results for input(s): PROBNP in the last 8760 hours. HbA1C: No results for input(s): HGBA1C in the last 72 hours. CBG: No results for input(s): GLUCAP in the last 168 hours. Lipid Profile: No results for input(s): CHOL, HDL, LDLCALC, TRIG, CHOLHDL, LDLDIRECT in the last 72 hours. Thyroid Function Tests: No results for input(s): TSH, T4TOTAL, FREET4, T3FREE, THYROIDAB in the last 72 hours. Anemia Panel: No results for input(s): VITAMINB12, FOLATE, FERRITIN, TIBC, IRON, RETICCTPCT in the last 72 hours. Sepsis Labs: No results for input(s): PROCALCITON, LATICACIDVEN in the last 168 hours.  No results found for this or any previous visit (from the past 240 hour(s)).       Radiology Studies: No results found.      Scheduled Meds: . [MAR Hold] ciprofloxacin  500 mg Oral BID  . [MAR Hold] dicyclomine  10 mg Oral TID AC & HS  . [MAR Hold] famotidine (PEPCID) IV  20 mg Intravenous Q12H  . [MAR Hold] feeding supplement (ENSURE ENLIVE)  237 mL  Oral Q24H  . [MAR Hold] fluconazole  200 mg Oral Once  . [MAR Hold] FLUoxetine  40 mg Oral Daily  . [MAR Hold] insulin aspart  0-9 Units Subcutaneous TID WC  . [MAR Hold] methylPREDNISolone (SOLU-MEDROL) injection  30 mg Intravenous Daily  . [MAR Hold] metroNIDAZOLE  500 mg Oral Q8H  . [MAR Hold] midazolam  2 mg Intravenous Once  . [MAR Hold] oxymorphone  10 mg Oral QAC breakfast  . [MAR Hold] oxymorphone  10 mg Oral QHS  . [MAR Hold] potassium chloride  40 mEq Oral Daily  . [MAR Hold] tinidazole  500 mg Oral BID   Continuous Infusions: . sodium chloride       LOS: 34 days    Time spent:20 minutes.    Brelan Hannen Tanna Furry,  MD [facility] Pager [phone]  If 7PM-7AM, please contact night-coverage [email or web address] Password [email or web address] 02/11/2016, 12:04 PM

## 2016-02-11 NOTE — Anesthesia Procedure Notes (Signed)
Procedure Name: Intubation Date/Time: 02/11/2016 11:00 AM Performed by: Joquan Lotz, Virgel Gess Pre-anesthesia Checklist: Patient identified, Emergency Drugs available, Suction available, Patient being monitored and Timeout performed Patient Re-evaluated:Patient Re-evaluated prior to inductionOxygen Delivery Method: Circle system utilized Preoxygenation: Pre-oxygenation with 100% oxygen Intubation Type: IV induction Ventilation: Mask ventilation without difficulty Laryngoscope Size: Mac and 4 Grade View: Grade I Tube type: Oral Tube size: 7.5 mm Number of attempts: 1 Airway Equipment and Method: Stylet Placement Confirmation: ETT inserted through vocal cords under direct vision,  positive ETCO2,  CO2 detector and breath sounds checked- equal and bilateral Secured at: 21 cm Tube secured with: Tape Dental Injury: Teeth and Oropharynx as per pre-operative assessment

## 2016-02-11 NOTE — Anesthesia Postprocedure Evaluation (Signed)
Anesthesia Post Note  Patient: Sara Barnes  Procedure(s) Performed: Procedure(s) (LRB): LAPAROSCOPIC DIVERTED ILEOSTOMY (N/A)  Patient location during evaluation: PACU Anesthesia Type: General Level of consciousness: awake and alert Pain management: pain level controlled Vital Signs Assessment: post-procedure vital signs reviewed and stable Respiratory status: spontaneous breathing, nonlabored ventilation, respiratory function stable and patient connected to nasal cannula oxygen Cardiovascular status: blood pressure returned to baseline and stable Postop Assessment: no signs of nausea or vomiting Anesthetic complications: no    Last Vitals:  Vitals:   02/11/16 1525 02/11/16 1636  BP: 98/62 93/61  Pulse: 68   Resp:    Temp:      Last Pain:  Vitals:   02/11/16 1556  TempSrc:   PainSc: Asleep                 Nakaya Mishkin JENNETTE

## 2016-02-12 ENCOUNTER — Ambulatory Visit: Payer: Commercial Managed Care - HMO | Admitting: Internal Medicine

## 2016-02-12 LAB — COMPREHENSIVE METABOLIC PANEL
ALK PHOS: 57 U/L (ref 38–126)
ALT: 14 U/L (ref 14–54)
AST: 13 U/L — AB (ref 15–41)
Albumin: 2.5 g/dL — ABNORMAL LOW (ref 3.5–5.0)
Anion gap: 5 (ref 5–15)
BUN: 6 mg/dL (ref 6–20)
CALCIUM: 8.4 mg/dL — AB (ref 8.9–10.3)
CO2: 28 mmol/L (ref 22–32)
CREATININE: 0.57 mg/dL (ref 0.44–1.00)
Chloride: 102 mmol/L (ref 101–111)
GFR calc non Af Amer: >60 mL/min (ref >60)
GLUCOSE: 101 mg/dL — AB (ref 65–99)
Potassium: 4.3 mmol/L (ref 3.5–5.1)
SODIUM: 135 mmol/L (ref 135–145)
Total Bilirubin: 0.5 mg/dL (ref 0.3–1.2)
Total Protein: 5.2 g/dL — ABNORMAL LOW (ref 6.5–8.1)

## 2016-02-12 LAB — CBC
HCT: 29.8 % — ABNORMAL LOW (ref 36.0–46.0)
HEMOGLOBIN: 9.6 g/dL — AB (ref 12.0–15.0)
MCH: 29.6 pg (ref 26.0–34.0)
MCHC: 32.2 g/dL (ref 30.0–36.0)
MCV: 92 fL (ref 78.0–100.0)
Platelets: 274 10*3/uL (ref 150–400)
RBC: 3.24 MIL/uL — AB (ref 3.87–5.11)
RDW: 15.3 % (ref 11.5–15.5)
WBC: 7.8 10*3/uL (ref 4.0–10.5)

## 2016-02-12 LAB — GLUCOSE, CAPILLARY
GLUCOSE-CAPILLARY: 99 mg/dL (ref 65–99)
Glucose-Capillary: 105 mg/dL — ABNORMAL HIGH (ref 65–99)

## 2016-02-12 MED ORDER — HYDROCODONE-ACETAMINOPHEN 10-325 MG PO TABS
1.0000 | ORAL_TABLET | Freq: Four times a day (QID) | ORAL | Status: DC | PRN
  Administered 2016-02-12 – 2016-02-14 (×9): 1 via ORAL
  Filled 2016-02-12 (×9): qty 1

## 2016-02-12 NOTE — Care Management Important Message (Signed)
Important Message  Patient Details  Name: Sara Barnes MRN: [id number] Date of Birth: 18-Apr-1941   Medicare Important Message Given:  Yes    [name] 02/12/2016, 12:09 [place] Message  Patient Details  Name: Sara Barnes MRN: [id number] Date of Birth: 08-14-1940   Medicare Important Message Given:  Yes    [name] 02/12/2016, 12:09 PM

## 2016-02-12 NOTE — Op Note (Signed)
NAME:  Sara Barnes, Sara Barnes                ACCOUNT NO.:  [id number]  MEDICAL RECORD NO.:  [id number]  LOCATION:                                 FACILITY:  PHYSICIAN:  Isabel Caprice. Hassell Done, MD  DATE OF BIRTH:  [birth year]  DATE OF PROCEDURE: DATE OF DISCHARGE:                              OPERATIVE REPORT   PREOPERATIVE DIAGNOSIS:  Longstanding ulcerative colitis with perineal abscesses.  PROCEDURE:  Laparoscopic end-ileostomy.  SURGEON:  Isabel Caprice. Hassell Done, MD.  ASSISTANT:  None.  ANESTHESIA:  General endotracheal.  DESCRIPTION OF PROCEDURE:  This 75 year old female was taken back to room #6, given general anesthesia.  The abdomen was prepped with [facility], draped sterilely.  Access to the abdomen was achieved to the left upper quadrant with 5-mm Optiview.  A second 5-mm was placed below the umbilicus in the midline and with those two, I was able to identify the terminal ilium and the ileocecal valve.  The patient did not have any markings for an ileostomy, and I selected the site slightly above the umbilicus in the right upper quadrant and first passed a 5-mm instrument and used that to help me to create a window in the mesentery.  Once that was created, I changed out the 5-mm port to a 12-mm port and inserted a brown load endocutter by [employer] and divided the small bowel.  I then grasped the end of the ilium and first identified the blind segment of ilium going into the cecum.  The mesentery had been adequately divided with Harmonic scalpel and then I was able to bring that up into the 12- mm trocar that I had changed out and brought it out to the skin.  When I reduced the pressures, it was really essentially under no tension and it had about an inch outside of the abdomen.  I surveyed everything and did not see any problems else.  I did not see a lot of creeping fat and I would think that this was more likely appearing to be an ulcerative colitis case rather than a Crohn's case,  but that is just an observational opinion for disease that the patient has had for 40 years. Following decompression, I then matured the ileostomy, tacking it first down near the skin leaving about an inch out above the skin and also preserving the mesentery.  I then took purchases of skin in the midportion of the ilium and then to the mucosa to splay it open somewhat, although where the mesentery was.  There was not a complete splaying at that point.  I felt that mesentery was necessary to preserve the blood supply.  There did seem to be an adequate amount outside to permit easy bagging.  Prior to maturing this, I had also closed the skin incisions on the two other ports with 4-0 Monocryl and then LiquiBand.  The patient seemed to tolerate the procedure well and was taken to the recovery room.     Isabel Caprice Hassell Done, MD   ______________________________ Isabel Caprice Hassell Done, MD    MBM/MEDQ  D:  02/11/2016  T:  02/12/2016  Job:  [id number]  cc:   Zenovia Jarred, MD Fax: [phone]

## 2016-02-12 NOTE — Progress Notes (Signed)
     [facility] Progress Note  Subjective:  Says that she is very sleepy and wants to keep napping.  No new complaints.  Did not talk much.  Has been ambulating.  Objective:  Vital signs in last 24 hours: Temp:  [97.3 F (36.3 C)-97.8 F (36.6 C)] 97.6 F (36.4 C) (10/17 0336) Pulse Rate:  [64-85] 64 (10/17 0336) Resp:  [12-16] 16 (10/17 0336) BP: (88-115)/(53-71) 115/71 (10/17 0336) SpO2:  [99 %-100 %] 100 % (10/17 0336) Last BM Date: 02/11/16 General:  Alert, Well-developed, in NAD Heart:  Regular rate and rhythm; no murmurs Pulm:  CTAB.  No W/R/R. Abdomen:  Soft, non-distended.  BS present.  Appropriate TTP.  Ileostomy noted on the right with air and small amount of liquid stool in bag.   Extremities:  Without edema. Neurologic:  Alert and oriented x 4;  grossly normal neurologically. Psych:  Alert and cooperative. Normal mood and affect.  Intake/Output from previous day: 10/16 0701 - 10/17 0700 In: 2201.3 [I.V.:2101.3; IV Piggyback:100] Out: 2125 [Urine:2050; Stool:50; Blood:25]  Lab Results:  Recent Labs  02/10/16 0923 02/11/16 1336 02/12/16 0517  WBC 7.4 12.9* 7.8  HGB 10.3* 10.6* 9.6*  HCT 32.1* 32.3* 29.8*  PLT 277 269 274   BMET  Recent Labs  02/10/16 0923 02/11/16 1336 02/12/16 0517  NA 134* 135 135  K 4.1 4.3 4.3  CL 97* 98* 102  CO2 29 29 28   GLUCOSE 85 115* 101*  BUN 7 7 6   CREATININE 0.65 0.67 0.57  CALCIUM 8.8* 8.3* 8.4*   LFT  Recent Labs  02/12/16 0517  PROT 5.2*  ALBUMIN 2.5*  AST 13*  ALT 14  ALKPHOS 25  BILITOT 0.5   Assessment / Plan: 49. 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   -Diet advancement per surgery. -Continue cipro 500 mg BID and tinidazole 500 mg BID. -Next Remicade dose due 10/30 and should only receive 5 mg/kg. -Continue solumedrol 30 mg daily for now with plans to taper.   LOS: 35 days   Sara Barnes D.  02/12/2016, 8:53 AM  Pager number [phone]

## 2016-02-12 NOTE — Progress Notes (Signed)
PROGRESS NOTE    Sara Barnes  OMB:[id number] DOB: 06-17-1940 DOA: 01/07/2016 PCP: Ann Held, DO   Brief Narrative: HPI on 01/07/2016 by Dr. Gilman Schmidt [name] a 75 y.o.femalewith medical history significant of ulcerative colitis versus Crohn's disease, believed to be UC eventually,otherwise healthy, presents to the emergency room with chief complaint of severe abdominal pain for the past several days. She was recently hospitalized and discharged home about 9 days prior to admission. She was here in the hospital with similar complaints, placed on steroids, and tells me today that she improved some and really wanted to go home. At home, she has had ongoing symptoms, never really well achieved full remission, and over the last few days she has felt extremely unwell. She has crampy abdominal pain, nausea, poor appetite, barely able to keep down some Gatorade, and has been having significant amounts of bloody bowel movements as well. She has no fever or chills. She has no chest pain or shortness of breath. She denies any palpitations. She has no lightheadedness or dizziness. She feels generalized weakness. In the emergency room her vital signs are stable, her blood work is essentially unremarkable. Gastroenterology was consulted by EDP and have evaluated patient, and TRH was asked for admission.   Interim history Gastroenterology consultation evaluated the patient. She underwent flexible sigmoidoscopy. CMV was negative and she was started on Remicade. She was initially doing better and tolerating some diet and since still having a lot of pain and frequent Bms she was dosed with another Remicaide infusion on 9/17. On 9/21 pain and bloody clots worsened overnight. CT abd/pelvis showed perirectal abscess- General surgery was consulted. Patient underwent I&D on 9/26. Steroids were decreased per GI recommendations. Patient received dose of amikacin on 01/28/2016. Clinically continuing  to improve. Patient is working with physical therapy  UC vs crohn's flare, perirectal abscess s/p drain,  S/p remicade, Currently on IV Solu-Medrol, cipro and tinidazole.  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 Starting clear liquid diet today.  Assessment & Plan:   # Abdominal pain/diarrhea secondary to Ulcerative colitis flare with rectal bleeding/ Perianal abscess -Patient is s/p flexible sigmoidoscopy on 01/08/16, biopsy with findings of active erosive ulcer. No CMV found. Has been given Remicade on 01/09/2016, 01/13/16, and 01/28/2016.Patient is status post I&D of abscess - Currently on ciprofloxacin, IV solumedrol and tinidazole by Gi. Tapering of steroid as per Gi.  -s/p diverting end ileostomy yesterday. Starting clear liquid by surgery team.  -Continue IVF and pepcid. May discontinue IVF when patient has good oral intake. Continue to provide supportive care and follow up consultant's recommendation.  # Acute blood loss anemia: drop in Hb today likely due to blood loss during surgery. No sign of bleeding.   -She received blood transfusion during this hospitalization. -monitor lab.  #Pain management: Patient is currently on opana ER 10 mg bid,c/o pain today.Adjusted to dose of oral norco. Trying to taper narcotics. Education provided to the patient.   # Physical deconditioning: PT, OT evaluation and treatment.  #Hypokalemia: improved. Monitor labs intermittently.   Continue current medical and supportive care. GI and general surgery consult appreciated.  DVT prophylaxis: SCD and early ambulation. No AC because of bleeding risk and had surgery yesterday. Code Status: Full code Family Communication: No family present at bedside today.  Disposition Plan: Likely discharge to rehabilitation versus SNF in 1-2 days depending on GI and surgery's plan    Consultants: GI and general surgery  Antimicrobials: Cipro and tinidazole.   Subjective:  Patient was seen and examined at bedside. Patient reported abdominal pain. Denied nausea, vomiting, chest pain, shortness of breath. No fever or chills.  Objective: Vitals:   02/11/16 2046 02/11/16 2349 02/12/16 0336 02/12/16 1357  BP: 92/67 97/69 115/71 109/67  Pulse: 69 77 64 60  Resp: 16 16 16 14   Temp: 97.8 F (36.6 C) 97.3 F (36.3 C) 97.6 F (36.4 C) 97.9 F (36.6 C)  TempSrc: Oral Oral Oral Oral  SpO2: 100% 100% 100% 97%  Weight:      Height:        Intake/Output Summary (Last 24 hours) at 02/12/16 1552 Last data filed at 02/12/16 1115  Gross per 24 hour  Intake          1001.25 ml  Output             2500 ml  Net         -1498.75 ml   Filed Weights   01/07/16 1136 02/08/16 1416  Weight: 72.6 kg (160 lb) 67.3 kg (148 lb 4.8 oz)    Examination:  General exam:Not in distress, lying comfortably on bed. Respiratory system: Clear to auscultation bilateral, no wheeze or crackle. Cardiovascular system: Regular rate rhythm, S1 and S2 normal. No murmur appreciated. Gastrointestinal system: Abdomen soft, nontender, nondistended. Bowel sound positive. Ileostomy bag with stool. No bleeding at the surgery site. Central nervous system: Alert, awake, oriented and following commands. Extremities: Symmetric 5 x 5 power. Skin: No rashes, lesions or ulcers Psychiatry: Judgement and insight appear normal. Mood & affect appropriate.     Data Reviewed: I have personally reviewed following labs and imaging studies  CBC:  Recent Labs Lab 02/06/16 0541 02/07/16 0544 02/10/16 0923 02/11/16 1336 02/12/16 0517  WBC 4.8 4.6 7.4 12.9* 7.8  NEUTROABS 2.9 2.6  --   --   --   HGB 8.1* 8.4* 10.3* 10.6* 9.6*  HCT 25.4* 26.3* 32.1* 32.3* 29.8*  MCV 91.7 92.0 89.4 90.5 92.0  PLT 196 206 277 269 595   Basic Metabolic Panel:  Recent Labs Lab 02/08/16 0627 02/10/16 0923 02/11/16 1336 02/12/16 0517  NA 137 134* 135 135  K 3.3* 4.1 4.3 4.3  CL 106 97* 98* 102  CO2 28 29 29 28     GLUCOSE 99 85 115* 101*  BUN 5* 7 7 6   CREATININE 0.47 0.65 0.67 0.57  CALCIUM 7.9* 8.8* 8.3* 8.4*  MG  --  2.1  --   --    GFR: Estimated Creatinine Clearance: 57.8 mL/min (by C-G formula based on SCr of 0.57 mg/dL). Liver Function Tests:  Recent Labs Lab 02/08/16 0627 02/12/16 0517  AST 16 13*  ALT 16 14  ALKPHOS 54 57  BILITOT 0.5 0.5  PROT 4.5* 5.2*  ALBUMIN 2.1* 2.5*   No results for input(s): LIPASE, AMYLASE in the last 168 hours. No results for input(s): AMMONIA in the last 168 hours. Coagulation Profile: No results for input(s): INR, PROTIME in the last 168 hours. Cardiac Enzymes: No results for input(s): CKTOTAL, CKMB, CKMBINDEX, TROPONINI in the last 168 hours. BNP (last 3 results) No results for input(s): PROBNP in the last 8760 hours. HbA1C: No results for input(s): HGBA1C in the last 72 hours. CBG:  Recent Labs Lab 02/11/16 1437 02/11/16 1625 02/11/16 2045 02/12/16 0740 02/12/16 1136  GLUCAP 115* 130* 131* 105* 99   Lipid Profile: No results for input(s): CHOL, HDL, LDLCALC, TRIG, CHOLHDL, LDLDIRECT in the last 72  hours. Thyroid Function Tests: No results for input(s): TSH, T4TOTAL, FREET4, T3FREE, THYROIDAB in the last 72 hours. Anemia Panel: No results for input(s): VITAMINB12, FOLATE, FERRITIN, TIBC, IRON, RETICCTPCT in the last 72 hours. Sepsis Labs: No results for input(s): PROCALCITON, LATICACIDVEN in the last 168 hours.  No results found for this or any previous visit (from the past 240 hour(s)).       Radiology Studies: No results found.      Scheduled Meds: . ciprofloxacin  500 mg Oral BID  . dicyclomine  10 mg Oral TID AC & HS  . famotidine (PEPCID) IV  20 mg Intravenous Q12H  . feeding supplement (ENSURE ENLIVE)  237 mL Oral Q24H  . fluconazole  200 mg Oral Once  . FLUoxetine  40 mg Oral Daily  . heparin  5,000 Units Subcutaneous Q8H  . hydrocortisone  25 mg Rectal Daily  . mesalamine  1,000 mg Rectal QHS  .  methylPREDNISolone (SOLU-MEDROL) injection  30 mg Intravenous Daily  . midazolam  2 mg Intravenous Once  . oxymorphone  10 mg Oral QAC breakfast  . oxymorphone  10 mg Oral QHS  . potassium chloride  40 mEq Oral Daily  . tinidazole  500 mg Oral BID   Continuous Infusions: . dextrose 5 % and 0.45 % NaCl with KCl 20 mEq/L 75 mL/hr at 02/12/16 0200     LOS: 35 days    Time spent:24 minutes.    Lealer Marsland Tanna Furry, MD [facility] Pager [phone]  If 7PM-7AM, please contact night-coverage [email or web address] Password [email or web address] 02/12/2016, 3:52 PM

## 2016-02-12 NOTE — Consult Note (Signed)
[facility] Nurse ostomy consult note Stoma type/location: RUQ ileostomy on 02/11/16. Dr. Hassell Done. Patient reports that ostomy is to be temporary, approximately 3-6 months. Stomal assessment/size: 1 and 1/4 inch budded, round and 50% red, moist, 50% slough.  Os at center Peristomal assessment: Intact, clear Treatment options for stomal/peristomal skin: skin barrier ring Output, small amount dark brown effluent  Ostomy pouching: 1pc.convex pouching system (1 and 1/2 inch) with skin barrier ring.  Education provided: Patient observed pouch removal and preparation.  Understands logic of skin barrier ring.  Taught basic GI A&P and some pouch and stoma characteristics.  Observed sizing of ostomy, tracing and cutting out of new pouch according to pattern. Observed stretching out of skin barrier ring and placing on skin, placement of pouch and removal of end tape borders.  She is able to perform Lock and Roll closure x2.  Patient observes a simulated emptying and cleaning out of the bottom two inches of the pouch tail closure with a toilet paper "wick".  An education booklet and a spare pouch set up are left at the bedside. Patient lives alone.  She hopes to go to a Rehab facility for a couple of weeks and then home with a HHRN.  She is motivated to be independent with emptying while here and perhaps even assist with a pouch change prior to discharge. Enrolled patient in [place] program: No WOC nursing team will follow, and will remain available to this patient, the nursing, surgical and medical teams.  Thanks, Maudie Flakes, MSN, RN, [place], Arther Abbott  Pager# [phone]

## 2016-02-12 NOTE — Progress Notes (Signed)
[facility] Progress Note  1 Day Post-Op  Subjective: Laying in bed - just got back from ambulating in the hall. Having minor abdominal pain in RUQ around ileostomy pouch, rated 3/10. Denies nausea, vomiting, or belching. Some air in ostomy pouch. Majority of her pain is still around her rectum.  Patient states her goal is to get well and have the ileostomy reversed as soon as possible.  Objective: Vital signs in last 24 hours: Temp:  [97.3 F (36.3 C)-97.8 F (36.6 C)] 97.6 F (36.4 C) (10/17 0336) Pulse Rate:  [64-85] 64 (10/17 0336) Resp:  [12-16] 16 (10/17 0336) BP: (88-115)/(53-71) 115/71 (10/17 0336) SpO2:  [99 %-100 %] 100 % (10/17 0336) Last BM Date: 02/11/16  Intake/Output from previous day: 10/16 0701 - 10/17 0700 In: 2201.3 [I.V.:2101.3; IV Piggyback:100] Out: 2125 [Urine:2050; Stool:50; Blood:25] Intake/Output this shift: No intake/output data recorded.  PE: Gen:  Alert, NAD, pleasant and cooperative Abd: Soft, appropriately tender, ND, very good BS in all four quadrants, incisions c/d/i, ileostomy pouch in RUQ with gas in pouch and <5cc brown liquid; stoma healthy pink/red in color.  Lab Results:   Recent Labs  02/11/16 1336 02/12/16 0517  WBC 12.9* 7.8  HGB 10.6* 9.6*  HCT 32.3* 29.8*  PLT 269 274   BMET  Recent Labs  02/11/16 1336 02/12/16 0517  NA 135 135  K 4.3 4.3  CL 98* 102  CO2 29 28  GLUCOSE 115* 101*  BUN 7 6  CREATININE 0.67 0.57  CALCIUM 8.3* 8.4*   CMP     Component Value Date/Time   NA 135 02/12/2016 0517   NA 138 12/06/2014 0804   K 4.3 02/12/2016 0517   K 4.1 12/06/2014 0804   CL 102 02/12/2016 0517   CO2 28 02/12/2016 0517   CO2 25 12/06/2014 0804   GLUCOSE 101 (H) 02/12/2016 0517   GLUCOSE 92 12/06/2014 0804   BUN 6 02/12/2016 0517   BUN 12.3 12/06/2014 0804   CREATININE 0.57 02/12/2016 0517   CREATININE 0.8 12/06/2014 0804   CALCIUM 8.4 (L) 02/12/2016 0517   CALCIUM 9.4 12/06/2014 0804   PROT 5.2  (L) 02/12/2016 0517   PROT 6.5 12/06/2014 0804   ALBUMIN 2.5 (L) 02/12/2016 0517   ALBUMIN 3.3 (L) 12/06/2014 0804   AST 13 (L) 02/12/2016 0517   AST 9 12/06/2014 0804   ALT 14 02/12/2016 0517   ALT 10 12/06/2014 0804   ALKPHOS 57 02/12/2016 0517   ALKPHOS 89 12/06/2014 0804   BILITOT 0.5 02/12/2016 0517   BILITOT 0.37 12/06/2014 0804   GFRNONAA >60 02/12/2016 0517   GFRAA >60 02/12/2016 0517   Lipase     Component Value Date/Time   LIPASE 31 04/15/2011 1700   Anti-infectives: Anti-infectives    Start     Dose/Rate Route Frequency Ordered Stop   02/11/16 1600  piperacillin-tazobactam (ZOSYN) IVPB 3.375 g     3.375 g 100 mL/hr over 30 Minutes Intravenous Every 8 hours 02/11/16 1403 02/11/16 1614   02/11/16 1000  tinidazole (TINDAMAX) tablet 500 mg     500 mg Oral 2 times daily 02/08/16 1124     02/10/16 1400  neomycin (MYCIFRADIN) tablet 1,000 mg     1,000 mg Oral 3 times daily 02/08/16 1603 02/10/16 2045   02/10/16 1400  metroNIDAZOLE (FLAGYL) tablet 500 mg  Status:  Discontinued     500 mg Oral 3 times daily 02/08/16 1603 02/10/16 0802   02/08/16 2000  metroNIDAZOLE (FLAGYL) tablet  500 mg  Status:  Discontinued     500 mg Oral Every 8 hours 02/08/16 1613 02/11/16 1500   02/02/16 1100  metroNIDAZOLE (FLAGYL) tablet 500 mg  Status:  Discontinued     500 mg Oral Every 12 hours 02/02/16 1024 02/08/16 1611   02/02/16 1100  ciprofloxacin (CIPRO) tablet 500 mg     500 mg Oral 2 times daily 02/02/16 1024     01/31/16 1400  fluconazole (DIFLUCAN) tablet 200 mg     200 mg Oral  Once 01/31/16 1204     01/31/16 0000  imipenem-cilastatin (PRIMAXIN) 500 mg in sodium chloride 0.9 % 100 mL IVPB  Status:  Discontinued     500 mg 200 mL/hr over 30 Minutes Intravenous Every 6 hours 01/30/16 1840 01/31/16 0957   01/30/16 1900  imipenem-cilastatin (PRIMAXIN) 500 mg in sodium chloride 0.9 % 100 mL IVPB     500 mg 200 mL/hr over 30 Minutes Intravenous STAT 01/30/16 1840 01/30/16 1956    01/29/16 2200  metroNIDAZOLE (FLAGYL) tablet 500 mg  Status:  Discontinued     500 mg Oral Every 8 hours 01/29/16 1700 01/30/16 1829   01/29/16 2000  ciprofloxacin (CIPRO) tablet 500 mg  Status:  Discontinued     500 mg Oral 2 times daily 01/29/16 1700 01/30/16 1829   01/22/16 0900  piperacillin-tazobactam (ZOSYN) IVPB 3.375 g  Status:  Discontinued     3.375 g 12.5 mL/hr over 240 Minutes Intravenous Every 8 hours 01/22/16 0840 01/29/16 1700   01/21/16 1830  ciprofloxacin (CIPRO) IVPB 400 mg  Status:  Discontinued     400 mg 200 mL/hr over 60 Minutes Intravenous Every 12 hours 01/21/16 1744 01/22/16 0840   01/21/16 1830  metroNIDAZOLE (FLAGYL) IVPB 500 mg  Status:  Discontinued     500 mg 100 mL/hr over 60 Minutes Intravenous Every 6 hours 01/21/16 1744 01/22/16 0840     Assessment/Plan POD#1  S/p LAPAROSCOPIC DIVERTED ILEOSTOMY, Dr. Johnathan Hausen, 02/11/16 - WBC WNL - gas in ileostomy pouch, +BS - pain controlled on current regimen: Fentanyl, OPANA ER, NORCO - ambulating   Crohn's colitis -second dose Remicade 01/28/16  Perirectal abscess with fistula S/p I&D w/ penrose placement 01/23/16, Dr. Armandina Gemma   FEN: NPO, IVF, limited sips/ice chips ID: cipro/flagyl 10/7 >>,  DVT Proph: [place] heparin, SCD's  Dispo: await bowel function, mobilize  WOC consult for new ileostomy    LOS: 22 days    Jill Alexanders , [facility] 02/12/2016, 8:31 AM Pager: [phone] Consults: [phone] Mon-Fri 7:00 am-4:30 pm Sat-Sun 7:00 am-11:30 am

## 2016-02-13 DIAGNOSIS — E876 Hypokalemia: Secondary | ICD-10-CM

## 2016-02-13 LAB — BASIC METABOLIC PANEL
ANION GAP: 6 (ref 5–15)
BUN: 7 mg/dL (ref 6–20)
CALCIUM: 8.4 mg/dL — AB (ref 8.9–10.3)
CHLORIDE: 98 mmol/L — AB (ref 101–111)
CO2: 27 mmol/L (ref 22–32)
Creatinine, Ser: 0.55 mg/dL (ref 0.44–1.00)
GFR calc non Af Amer: >60 mL/min (ref >60)
GLUCOSE: 88 mg/dL (ref 65–99)
POTASSIUM: 4.2 mmol/L (ref 3.5–5.1)
Sodium: 131 mmol/L — ABNORMAL LOW (ref 135–145)

## 2016-02-13 LAB — CBC
HEMATOCRIT: 30.4 % — AB (ref 36.0–46.0)
HEMOGLOBIN: 9.8 g/dL — AB (ref 12.0–15.0)
MCH: 30.1 pg (ref 26.0–34.0)
MCHC: 32.2 g/dL (ref 30.0–36.0)
MCV: 93.3 fL (ref 78.0–100.0)
Platelets: 288 10*3/uL (ref 150–400)
RBC: 3.26 MIL/uL — AB (ref 3.87–5.11)
RDW: 15.4 % (ref 11.5–15.5)
WBC: 7.6 10*3/uL (ref 4.0–10.5)

## 2016-02-13 MED ORDER — FAMOTIDINE 20 MG PO TABS
20.0000 mg | ORAL_TABLET | Freq: Two times a day (BID) | ORAL | Status: DC
  Administered 2016-02-13 – 2016-02-22 (×19): 20 mg via ORAL
  Filled 2016-02-13 (×20): qty 1

## 2016-02-13 MED ORDER — PREDNISONE 20 MG PO TABS
30.0000 mg | ORAL_TABLET | Freq: Every day | ORAL | Status: DC
  Administered 2016-02-14 – 2016-02-15 (×2): 30 mg via ORAL
  Filled 2016-02-13 (×2): qty 1

## 2016-02-13 MED ORDER — LORAZEPAM 0.5 MG PO TABS
0.5000 mg | ORAL_TABLET | Freq: Three times a day (TID) | ORAL | Status: DC | PRN
  Administered 2016-02-13 – 2016-02-28 (×12): 0.5 mg via ORAL
  Filled 2016-02-13 (×13): qty 1

## 2016-02-13 NOTE — Progress Notes (Signed)
PROGRESS NOTE    Sara Barnes  WYO:[id number] DOB: [birth year] DOA: 01/07/2016 PCP: Ann Held, DO    Brief Narrative:  Sara Barnes a 75 y.o.femalewith medical history significant of ulcerative colitis versus Crohn's disease, believed to be UC eventually,otherwise healthy, presents to the emergency room with chief complaint of severe abdominal pain for the past several days. She was recently hospitalized and discharged home about 9 days prior to admission. She was here in the hospital with similar complaints, placed on steroids, and tells me today that she improved some and really wanted to go home. At home, she has had ongoing symptoms, never really well achieved full remission, and over the last few days she has felt extremely unwell. She has crampy abdominal pain, nausea, poor appetite, barely able to keep down some Gatorade, and has been having significant amounts of bloody bowel movements as well. She has no fever or chills. She has no chest pain or shortness of breath. She denies any palpitations. She has no lightheadedness or dizziness. She feels generalized weakness. In the emergency room her vital signs are stable, her blood work is essentially unremarkable. Gastroenterology was consulted by EDP and have evaluated patient, and TRH was asked for admission.   Interim history Gastroenterology consultation evaluated the patient. She underwent flexible sigmoidoscopy. CMV was negative and she was started on Remicade. She was initially doing better and tolerating some diet and since still having a lot of pain and frequent Bms she was dosed with another Remicaide infusion on 9/17. On 9/21 pain and bloody clots worsened overnight. CT abd/pelvis showed perirectal abscess- General surgery was consulted. Patient underwent I&D on 9/26. Steroids were decreased per GI recommendations. Patient received dose of amikacin on 01/28/2016. Clinically continuing to improve. Patient is working with  physical therapy  UC vs crohn's flare, perirectal abscess s/p drain, S/p remicade, Currently on IV Solu-Medrol, cipro and tinidazole.  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 Started on clear liquid diet 10/17.    Assessment & Plan:   Principal Problem:   Ulcerative pancolitis with abscess [facility]) Active Problems:   Perirectal abscess   Rectal pain   Ulcerative pancolitis with rectal bleeding (HCC)   IBS (irritable bowel syndrome)   Abdominal pain   GI bleed   Left upper quadrant pain   Absolute anemia   Acute blood loss anemia   Difficulty in walking, not elsewhere classified   Other depression due to general medical condition   Fever   Hypokalemia   Ileostomy in place   #1 ulcerative pancolitis with rectal bleeding/perianal abscess Patient status post flexible sigmoidoscopy 01/08/2016 with biopsies and findings consistent with active erosive ulcer. CMV was negative. Patient was given Remicade 01/09/2016, 01/13/2016, 01/28/2016. Patient status post diverting end ileostomy 02/11/2016 and currently tolerating clears. Patient currently on oral ciprofloxacin and tinidazole as well as IV Solu-Medrol per GI. Continue hydration. Per GI and general surgery.  #2 acute blood loss anemia Status post 4 units packed red blood cells. H&H stable.  #3 hypokalemia Repleted.  #4 pain management Continue current regimen and try to taper down narcotics.   DVT prophylaxis: Heparin Code Status: Full Family Communication: Updated patient and family at bedside. Disposition Plan: Home with home health for skilled nursing facility.   Consultants:   Gen. surgery Dr. Harlow Asa 01/22/2016  Gastroenterology Dr. Havery Moros 01/07/2016  Procedures:   CT pelvis 01/31/2016  CT abdomen and pelvis 01/21/2016  Chest x-ray 01/30/2016  Acute abdominal series  01/07/2016  Laparoscopic and ileostomy are Dr. Hassell Done 02/11/2016  Flexible sigmoidoscopy 01/08/2016  Dr. Havery Moros  Incision, drainage and packing of complex perirectal abscess per Dr. Harlow Asa 01/22/2016  Status post 4 units packed red blood cells 9/25-26  Antimicrobials:   IV ciprofloxacin 01/21/2016>>>>> 01/22/2016  Oral ciprofloxacin 02/02/2016  IV ciprofloxacin 01/29/2016>>>> 01/30/2016  IV Primaxin 01/30/2016>>>>> 01/31/2016  IV Flagyl 01/21/2016>>.. 01/22/2016  IV Flagyl 01/29/2016>>>>> 02/11/2016  IV Zosyn 01/22/2016>>>> 01/29/2016  IV Zosyn 02/11/2016>>>> 02/11/2016  Oral tinidazole 02/11/2016                 Subjective: Patient noted to be very anxious this morning endocrine per nursing. Patient denies any chest pain. No shortness of breath.  Objective: Vitals:   02/12/16 0336 02/12/16 1357 02/12/16 2030 02/13/16 0403  BP: 115/71 109/67 116/65 115/72  Pulse: 64 60 62 66  Resp: 16 14 18 20   Temp: 97.6 F (36.4 C) 97.9 F (36.6 C) 97.8 F (36.6 C) 97.7 F (36.5 C)  TempSrc: Oral Oral Oral Oral  SpO2: 100% 97% 100% 98%  Weight:      Height:        Intake/Output Summary (Last 24 hours) at 02/13/16 1236 Last data filed at 02/13/16 0740  Gross per 24 hour  Intake             2475 ml  Output              400 ml  Net             2075 ml   Filed Weights   01/07/16 1136 02/08/16 1416  Weight: 72.6 kg (160 lb) 67.3 kg (148 lb 4.8 oz)    Examination:  General exam: Appears calm and comfortable  Respiratory system: Clear to auscultation. Respiratory effort normal. Cardiovascular system: S1 & S2 heard, RRR. No JVD, murmurs, rubs, gallops or clicks. No pedal edema. Gastrointestinal system: Abdomen is nondistended, soft and nontender. Colostomy bag intact with some stool noted. No organomegaly or masses felt. Normal bowel sounds heard. Central nervous system: Alert and oriented. No focal neurological deficits. Extremities: Symmetric 5 x 5 power. Skin: No rashes, lesions or ulcers Psychiatry: Judgement and insight appear normal. Mood & affect appropriate.       Data Reviewed: I have personally reviewed following labs and imaging studies  CBC:  Recent Labs Lab 02/07/16 0544 02/10/16 0923 02/11/16 1336 02/12/16 0517 02/13/16 0531  WBC 4.6 7.4 12.9* 7.8 7.6  NEUTROABS 2.6  --   --   --   --   HGB 8.4* 10.3* 10.6* 9.6* 9.8*  HCT 26.3* 32.1* 32.3* 29.8* 30.4*  MCV 92.0 89.4 90.5 92.0 93.3  PLT 206 277 269 274 941   Basic Metabolic Panel:  Recent Labs Lab 02/08/16 0627 02/10/16 0923 02/11/16 1336 02/12/16 0517 02/13/16 0531  NA 137 134* 135 135 131*  K 3.3* 4.1 4.3 4.3 4.2  CL 106 97* 98* 102 98*  CO2 28 29 29 28 27   GLUCOSE 99 85 115* 101* 88  BUN 5* 7 7 6 7   CREATININE 0.47 0.65 0.67 0.57 0.55  CALCIUM 7.9* 8.8* 8.3* 8.4* 8.4*  MG  --  2.1  --   --   --    GFR: Estimated Creatinine Clearance: 57.8 mL/min (by C-G formula based on SCr of 0.55 mg/dL). Liver Function Tests:  Recent Labs Lab 02/08/16 0627 02/12/16 0517  AST 16 13*  ALT 16 14  ALKPHOS 54 57  BILITOT 0.5 0.5  PROT  4.5* 5.2*  ALBUMIN 2.1* 2.5*   No results for input(s): LIPASE, AMYLASE in the last 168 hours. No results for input(s): AMMONIA in the last 168 hours. Coagulation Profile: No results for input(s): INR, PROTIME in the last 168 hours. Cardiac Enzymes: No results for input(s): CKTOTAL, CKMB, CKMBINDEX, TROPONINI in the last 168 hours. BNP (last 3 results) No results for input(s): PROBNP in the last 8760 hours. HbA1C: No results for input(s): HGBA1C in the last 72 hours. CBG:  Recent Labs Lab 02/11/16 1437 02/11/16 1625 02/11/16 2045 02/12/16 0740 02/12/16 1136  GLUCAP 115* 130* 131* 105* 99   Lipid Profile: No results for input(s): CHOL, HDL, LDLCALC, TRIG, CHOLHDL, LDLDIRECT in the last 72 hours. Thyroid Function Tests: No results for input(s): TSH, T4TOTAL, FREET4, T3FREE, THYROIDAB in the last 72 hours. Anemia Panel: No results for input(s): VITAMINB12, FOLATE, FERRITIN, TIBC, IRON, RETICCTPCT in the last 72 hours. Sepsis  Labs: No results for input(s): PROCALCITON, LATICACIDVEN in the last 168 hours.  No results found for this or any previous visit (from the past 240 hour(s)).       Radiology Studies: No results found.      Scheduled Meds: . ciprofloxacin  500 mg Oral BID  . dicyclomine  10 mg Oral TID AC & HS  . famotidine (PEPCID) IV  20 mg Intravenous Q12H  . feeding supplement (ENSURE ENLIVE)  237 mL Oral Q24H  . fluconazole  200 mg Oral Once  . FLUoxetine  40 mg Oral Daily  . heparin  5,000 Units Subcutaneous Q8H  . hydrocortisone  25 mg Rectal Daily  . mesalamine  1,000 mg Rectal QHS  . midazolam  2 mg Intravenous Once  . oxymorphone  10 mg Oral QAC breakfast  . oxymorphone  10 mg Oral QHS  . potassium chloride  40 mEq Oral Daily  . [START ON 02/14/2016] predniSONE  30 mg Oral QAC breakfast  . tinidazole  500 mg Oral BID   Continuous Infusions: . dextrose 5 % and 0.45 % NaCl with KCl 20 mEq/L 75 mL/hr at 02/13/16 1045     LOS: 36 days    Time spent: [place], MD [facility] Pager [phone]  If 7PM-7AM, please contact night-coverage [email or web address] Password [email or web address] 02/13/2016, 12:36 PM

## 2016-02-13 NOTE — Progress Notes (Signed)
Nutrition Follow-up  DOCUMENTATION CODES:   Not applicable  INTERVENTION:  - Continue Ensure Enlive once/day. - Continue to encourage PO intakes of meals and supplements. - RD will continue to monitor for additional nutrition-related needs.  NUTRITION DIAGNOSIS:   Increased nutrient needs related to catabolic illness as evidenced by estimated needs. -ongoing  GOAL:   Patient will meet greater than or equal to 90% of their needs -unmet d/t advancing diet.  MONITOR:   PO intake, Supplement acceptance, Weight trends, Labs, I & O's  ASSESSMENT:   75 y.o.femalewith medical history significant of ulcerative colitis versus Crohn's disease, believed to be UC eventually,otherwise healthy, presents to the emergency room with chief complaint of severe abdominal pain for the past several days PTA.  10/18 Diet changes as follows: 10/16 @ 0001: NPO 10/17 @ 1345: CLD 10/18 @ 1130: Soft  Per chart review, pt consumed 100% of CLD for breakfast this AM. Unable to see pt x2 attempted visits today. Diet advanced prior to lunch. Per review of orders, pt has been taking Ensure except when unable to receive it d/t diet order. Will continue Ensure Enlive once/day and increase if needed at follow-up dependent on intakes at that time. No new weight since 10/13.  Medications reviewed; PRN Zofran, 40 mEq oral KCl/day, 30 mg oral Deltasone/day.  Labs reviewed; CBGs: 99 and 105 mg/dL, Na: 131 mmol/L, Cl: 98 mmol/L, Ca: 8.4 mg/dL.  IVF: D5-1/2 NS-20 mEq KCl @ 75 mL/hr (306 kcal).     10/16 Pertinent Hospital Course: -CT abd/pelvis earlier in admission showed perirectal abscess. Patient underwent I&D of perirectal abscess on 9/26. -S/p remicade. Currently on IV solu-medrol, cipro, and flagyl. -Plan for diverting ileostomy next week.  - Patient reports her appetite remains good and she is eating well.  - Reports abdominal pain, diarrhea, and occasional nausea.  - Her UBW is 160 lbs and she had  been weight stable prior to admission.  - The only foods she avoids at home for UC is coffee, fresh vegetables, and mild products (lactose).  - Patient is amenable to drinking Ensure daily to increase intake of protein and calories as long as it is lactose-free. Assured patient Ensure is lactose-free. - Meal Completion: 100% per chart and patient for most of stay.  - In the past 24 hours, patient has had 1958 kcal (100% minimum estimated kcal needs) and 75 grams protein (79% minimum estimated protein needs). - Weight trend: CBW is 148.3 lbs (-11.7 lbs from admission weight of 160 lbs).  - This is a 7% weight loss in 1 month, which is significant for time frame.    Diet Order:  DIET SOFT Room service appropriate? Yes; Fluid consistency: Thin  Skin:  Wound (see comment) (MSAD to perineum, buttocks; closed incision rectum)  Last BM:  10/18  Height:   Ht Readings from Last 1 Encounters:  01/07/16 5' 6"  (1.676 m)    Weight:   Wt Readings from Last 1 Encounters:  02/08/16 148 lb 4.8 oz (67.3 kg)    Ideal Body Weight:  59.09 kg  BMI:  Body mass index is 23.94 kg/m.  Estimated Nutritional Needs:   Kcal:  1675-1915 (MSJ x 1.4-1.6)  Protein:  95-110 grams (1.4-1.6 g/kg)  Fluid:  1.6-1.9 L/day  EDUCATION NEEDS:   No education needs identified at this time    Jarome Matin, MS, RD, LDN Inpatient Clinical Dietitian Pager # [phone] After hours/weekend pager # [phone]

## 2016-02-13 NOTE — Progress Notes (Signed)
      Gastroenterology Progress Note  Subjective:  Upset/crying this morning.  Everything is wearing on her nerves/anxiety.  Asking for something for anxiety.  Objective:  Vital signs in last 24 hours: Temp:  [97.7 F (36.5 C)-97.9 F (36.6 C)] 97.7 F (36.5 C) (10/18 0403) Pulse Rate:  [60-66] 66 (10/18 0403) Resp:  [14-20] 20 (10/18 0403) BP: (109-116)/(65-72) 115/72 (10/18 0403) SpO2:  [97 %-100 %] 98 % (10/18 0403) Last BM Date: 02/13/16 (ostomy) General:  Alert, Well-developed, in NAD; tearful today Heart:  Regular rate and rhythm; no murmurs Pulm:  CTAB.  No W/R/R. Abdomen:  Soft, non-distended.  BS present.  TTP around stoma site. Extremities:  Without edema. Neurologic:  Alert and oriented x 4;  grossly normal neurologically. Psych:  Alert and cooperative. Normal mood and affect.  Intake/Output from previous day: 10/17 0701 - 10/18 0700 In: 2235 [P.O.:390; I.V.:1745; IV Piggyback:100] Out: 750 [Urine:700; Stool:50] Intake/Output this shift: Total I/O In: -  Out: 50 [Stool:50]  Lab Results:  Recent Labs  02/11/16 1336 02/12/16 0517 02/13/16 0531  WBC 12.9* 7.8 7.6  HGB 10.6* 9.6* 9.8*  HCT 32.3* 29.8* 30.4*  PLT 269 274 288   BMET  Recent Labs  02/11/16 1336 02/12/16 0517 02/13/16 0531  NA 135 135 131*  K 4.3 4.3 4.2  CL 98* 102 98*  CO2 29 28 27   GLUCOSE 115* 101* 88  BUN 7 6 7   CREATININE 0.67 0.57 0.55  CALCIUM 8.3* 8.4* 8.4*   LFT  Recent Labs  02/12/16 0517  PROT 5.2*  ALBUMIN 2.5*  AST 13*  ALT 14  ALKPHOS 57  BILITOT 0.5   Assessment / Plan: 45. 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   -Diet advancement per surgery. -Continue cipro 500 mg BID and tinidazole 500 mg BID.  ? How long to continue. -Next Remicade dose due 10/30 and should only receive 5 mg/kg. -Will switch to PO prednisone 30 mg daily starting 10/19. -? When surgery plans to remove her drain. -Social worker to work on placement for rehab. -Nursing team discussed with Dr. Grandville Silos, hospitalist, regarding something for anxiety. -Continue to try to transition to PO pain meds.    LOS: 36 days   Caliana Spires D.  02/13/2016, 9:10 AM  Pager number [phone]

## 2016-02-13 NOTE — Progress Notes (Signed)
Pt was taught by the RN how to release air from the ileostomy bag. RN provided ileostomy information guide to the pt and pt was informed to ask the RN any questions. Pt demonstrated at the end of the shift on how to empty her ileostomy bag and how to clean it.  Quinton Voth W Wilborn Membreno, RN

## 2016-02-13 NOTE — Progress Notes (Signed)
Patient ID: Sara Barnes, female   DOB: 03-08-1941, 75 y.o.   MRN: [id number] [facility] Progress Note:   2 Days Post-Op  Subjective: Mental status is had some pain issues during the night Objective: Vital signs in last 24 hours: Temp:  [97.7 F (36.5 C)-97.9 F (36.6 C)] 97.7 F (36.5 C) (10/18 0403) Pulse Rate:  [60-66] 66 (10/18 0403) Resp:  [14-20] 20 (10/18 0403) BP: (109-116)/(65-72) 115/72 (10/18 0403) SpO2:  [97 %-100 %] 98 % (10/18 0403)  Intake/Output from previous day: 10/17 0701 - 10/18 0700 In: 2235 [P.O.:390; I.V.:1745; IV Piggyback:100] Out: 750 [Urine:700; Stool:50] Intake/Output this shift: Total I/O In: -  Out: 50 [Stool:50]  Physical Exam: Work of breathing is normal.  Ostomy with flatus in bag.    Lab Results:  Results for orders placed or performed during the hospital encounter of 01/07/16 (from the past 48 hour(s))  Basic metabolic panel     Status: Abnormal   Collection Time: 02/11/16  1:36 PM  Result Value Ref Range   Sodium 135 135 - 145 mmol/L   Potassium 4.3 3.5 - 5.1 mmol/L   Chloride 98 (L) 101 - 111 mmol/L   CO2 29 22 - 32 mmol/L   Glucose, Bld 115 (H) 65 - 99 mg/dL   BUN 7 6 - 20 mg/dL   Creatinine, Ser 0.67 0.44 - 1.00 mg/dL   Calcium 8.3 (L) 8.9 - 10.3 mg/dL   GFR calc non Af Amer >60 >60 mL/min   GFR calc Af Amer >60 >60 mL/min    Comment: (NOTE) The eGFR has been calculated using the CKD EPI equation. This calculation has not been validated in all clinical situations. eGFR's persistently <60 mL/min signify possible Chronic Kidney Disease.    Anion gap 8 5 - 15  CBC     Status: Abnormal   Collection Time: 02/11/16  1:36 PM  Result Value Ref Range   WBC 12.9 (H) 4.0 - 10.5 K/uL   RBC 3.57 (L) 3.87 - 5.11 MIL/uL   Hemoglobin 10.6 (L) 12.0 - 15.0 g/dL   HCT 32.3 (L) 36.0 - 46.0 %   MCV 90.5 78.0 - 100.0 fL   MCH 29.7 26.0 - 34.0 pg   MCHC 32.8 30.0 - 36.0 g/dL   RDW 15.4 11.5 - 15.5 %   Platelets 269 150 - 400 K/uL   Glucose, capillary     Status: Abnormal   Collection Time: 02/11/16  2:37 PM  Result Value Ref Range   Glucose-Capillary 115 (H) 65 - 99 mg/dL  Glucose, capillary     Status: Abnormal   Collection Time: 02/11/16  4:25 PM  Result Value Ref Range   Glucose-Capillary 130 (H) 65 - 99 mg/dL  Glucose, capillary     Status: Abnormal   Collection Time: 02/11/16  8:45 PM  Result Value Ref Range   Glucose-Capillary 131 (H) 65 - 99 mg/dL  CBC     Status: Abnormal   Collection Time: 02/12/16  5:17 AM  Result Value Ref Range   WBC 7.8 4.0 - 10.5 K/uL   RBC 3.24 (L) 3.87 - 5.11 MIL/uL   Hemoglobin 9.6 (L) 12.0 - 15.0 g/dL   HCT 29.8 (L) 36.0 - 46.0 %   MCV 92.0 78.0 - 100.0 fL   MCH 29.6 26.0 - 34.0 pg   MCHC 32.2 30.0 - 36.0 g/dL   RDW 15.3 11.5 - 15.5 %   Platelets 274 150 - 400 K/uL  Comprehensive metabolic panel  Status: Abnormal   Collection Time: 02/12/16  5:17 AM  Result Value Ref Range   Sodium 135 135 - 145 mmol/L   Potassium 4.3 3.5 - 5.1 mmol/L   Chloride 102 101 - 111 mmol/L   CO2 28 22 - 32 mmol/L   Glucose, Bld 101 (H) 65 - 99 mg/dL   BUN 6 6 - 20 mg/dL   Creatinine, Ser 0.57 0.44 - 1.00 mg/dL   Calcium 8.4 (L) 8.9 - 10.3 mg/dL   Total Protein 5.2 (L) 6.5 - 8.1 g/dL   Albumin 2.5 (L) 3.5 - 5.0 g/dL   AST 13 (L) 15 - 41 U/L   ALT 14 14 - 54 U/L   Alkaline Phosphatase 57 38 - 126 U/L   Total Bilirubin 0.5 0.3 - 1.2 mg/dL   GFR calc non Af Amer >60 >60 mL/min   GFR calc Af Amer >60 >60 mL/min    Comment: (NOTE) The eGFR has been calculated using the CKD EPI equation. This calculation has not been validated in all clinical situations. eGFR's persistently <60 mL/min signify possible Chronic Kidney Disease.    Anion gap 5 5 - 15  Glucose, capillary     Status: Abnormal   Collection Time: 02/12/16  7:40 AM  Result Value Ref Range   Glucose-Capillary 105 (H) 65 - 99 mg/dL  Glucose, capillary     Status: None   Collection Time: 02/12/16 11:36 AM  Result Value Ref  Range   Glucose-Capillary 99 65 - 99 mg/dL  CBC     Status: Abnormal   Collection Time: 02/13/16  5:31 AM  Result Value Ref Range   WBC 7.6 4.0 - 10.5 K/uL   RBC 3.26 (L) 3.87 - 5.11 MIL/uL   Hemoglobin 9.8 (L) 12.0 - 15.0 g/dL   HCT 30.4 (L) 36.0 - 46.0 %   MCV 93.3 78.0 - 100.0 fL   MCH 30.1 26.0 - 34.0 pg   MCHC 32.2 30.0 - 36.0 g/dL   RDW 15.4 11.5 - 15.5 %   Platelets 288 150 - 400 K/uL  Basic metabolic panel     Status: Abnormal   Collection Time: 02/13/16  5:31 AM  Result Value Ref Range   Sodium 131 (L) 135 - 145 mmol/L   Potassium 4.2 3.5 - 5.1 mmol/L   Chloride 98 (L) 101 - 111 mmol/L   CO2 27 22 - 32 mmol/L   Glucose, Bld 88 65 - 99 mg/dL   BUN 7 6 - 20 mg/dL   Creatinine, Ser 0.55 0.44 - 1.00 mg/dL   Calcium 8.4 (L) 8.9 - 10.3 mg/dL   GFR calc non Af Amer >60 >60 mL/min   GFR calc Af Amer >60 >60 mL/min    Comment: (NOTE) The eGFR has been calculated using the CKD EPI equation. This calculation has not been validated in all clinical situations. eGFR's persistently <60 mL/min signify possible Chronic Kidney Disease.    Anion gap 6 5 - 15    Radiology/Results: No results found.  Anti-infectives: Anti-infectives    Start     Dose/Rate Route Frequency Ordered Stop   02/11/16 1600  piperacillin-tazobactam (ZOSYN) IVPB 3.375 g     3.375 g 100 mL/hr over 30 Minutes Intravenous Every 8 hours 02/11/16 1403 02/11/16 1614   02/11/16 1000  tinidazole (TINDAMAX) tablet 500 mg     500 mg Oral 2 times daily 02/08/16 1124     02/10/16 1400  neomycin (MYCIFRADIN) tablet 1,000 mg  1,000 mg Oral 3 times daily 02/08/16 1603 02/10/16 2045   02/10/16 1400  metroNIDAZOLE (FLAGYL) tablet 500 mg  Status:  Discontinued     500 mg Oral 3 times daily 02/08/16 1603 02/10/16 0802   02/08/16 2000  metroNIDAZOLE (FLAGYL) tablet 500 mg  Status:  Discontinued     500 mg Oral Every 8 hours 02/08/16 1613 02/11/16 1500   02/02/16 1100  metroNIDAZOLE (FLAGYL) tablet 500 mg  Status:   Discontinued     500 mg Oral Every 12 hours 02/02/16 1024 02/08/16 1611   02/02/16 1100  ciprofloxacin (CIPRO) tablet 500 mg     500 mg Oral 2 times daily 02/02/16 1024     01/31/16 1400  fluconazole (DIFLUCAN) tablet 200 mg     200 mg Oral  Once 01/31/16 1204     01/31/16 0000  imipenem-cilastatin (PRIMAXIN) 500 mg in sodium chloride 0.9 % 100 mL IVPB  Status:  Discontinued     500 mg 200 mL/hr over 30 Minutes Intravenous Every 6 hours 01/30/16 1840 01/31/16 0957   01/30/16 1900  imipenem-cilastatin (PRIMAXIN) 500 mg in sodium chloride 0.9 % 100 mL IVPB     500 mg 200 mL/hr over 30 Minutes Intravenous STAT 01/30/16 1840 01/30/16 1956   01/29/16 2200  metroNIDAZOLE (FLAGYL) tablet 500 mg  Status:  Discontinued     500 mg Oral Every 8 hours 01/29/16 1700 01/30/16 1829   01/29/16 2000  ciprofloxacin (CIPRO) tablet 500 mg  Status:  Discontinued     500 mg Oral 2 times daily 01/29/16 1700 01/30/16 1829   01/22/16 0900  piperacillin-tazobactam (ZOSYN) IVPB 3.375 g  Status:  Discontinued     3.375 g 12.5 mL/hr over 240 Minutes Intravenous Every 8 hours 01/22/16 0840 01/29/16 1700   01/21/16 1830  ciprofloxacin (CIPRO) IVPB 400 mg  Status:  Discontinued     400 mg 200 mL/hr over 60 Minutes Intravenous Every 12 hours 01/21/16 1744 01/22/16 0840   01/21/16 1830  metroNIDAZOLE (FLAGYL) IVPB 500 mg  Status:  Discontinued     500 mg 100 mL/hr over 60 Minutes Intravenous Every 6 hours 01/21/16 1744 01/22/16 0840      Assessment/Plan: Problem List: Patient Active Problem List   Diagnosis Date Noted  . 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 Hypokalemia   . Fever   . Other depression due to general medical condition   . Difficulty in walking, not elsewhere classified   . Acute blood loss anemia   . Absolute anemia   . Left upper quadrant pain   . Abdominal pain 01/07/2016  . GI bleed 01/07/2016  . IBS (irritable bowel syndrome)   . Tenesmus   . Ulcerative pancolitis with rectal bleeding ([place])    . Diarrhea 12/24/2015  . Crohn disease ([place]) 12/24/2015  . Ulcerative pancolitis with abscess ([place])   . Rectal pain   . Leucocytosis   . 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 Perirectal abscess 09/29/2013  . Depression 08/12/2006    May advance diet.  Ileostomy functioning.   2 Days Post-Op    LOS: 36 days   Matt B. Hassell Done, MD, [facility] [phone] beeper [phone]  02/13/2016 9:44 AM

## 2016-02-13 NOTE — Progress Notes (Signed)
OT Cancellation Note  Patient Details Name: Sara Barnes MRN: [id number] DOB: 1940/11/17   Cancelled Treatment:    Reason Eval/Treat Not Completed: Fatigue/lethargy limiting ability to participate  Will recheck on pt next day  Betsy Pries 02/13/2016, 11:48 AM

## 2016-02-14 DIAGNOSIS — Z932 Ileostomy status: Secondary | ICD-10-CM

## 2016-02-14 DIAGNOSIS — F411 Generalized anxiety disorder: Secondary | ICD-10-CM | POA: Clinically undetermined

## 2016-02-14 MED ORDER — SIMETHICONE 80 MG PO CHEW
160.0000 mg | CHEWABLE_TABLET | Freq: Four times a day (QID) | ORAL | Status: AC
  Administered 2016-02-14 – 2016-02-16 (×8): 160 mg via ORAL
  Filled 2016-02-14 (×8): qty 2

## 2016-02-14 MED ORDER — MORPHINE SULFATE ER 30 MG PO TBCR
30.0000 mg | EXTENDED_RELEASE_TABLET | Freq: Two times a day (BID) | ORAL | Status: DC
  Administered 2016-02-14 – 2016-02-29 (×24): 30 mg via ORAL
  Filled 2016-02-14 (×28): qty 1

## 2016-02-14 MED ORDER — HYDROCODONE-ACETAMINOPHEN 10-325 MG PO TABS
1.0000 | ORAL_TABLET | ORAL | Status: DC | PRN
  Administered 2016-02-14 – 2016-02-15 (×2): 2 via ORAL
  Filled 2016-02-14 (×2): qty 2

## 2016-02-14 NOTE — Progress Notes (Signed)
[facility] Progress Note  3 Days Post-Op  Subjective: Doing "about the same". Having flatus and stool in ileostomy pouch and becoming comfortable with burping her pouch. Still feels insecure about changing the pouch by herself. Pain over stomach described as achy. Perirectal pain decreasing - a lot of anxiety around rectal pain/bowel movements. Her goal is to ambulate 3x today and do at least one sitz bath.  Objective: Vital signs in last 24 hours: Temp:  [98 F (36.7 C)-98.5 F (36.9 C)] 98 F (36.7 C) (10/19 0500) Pulse Rate:  [75-79] 75 (10/19 0500) Resp:  [18-20] 20 (10/19 0500) BP: (90-109)/(51-73) 106/73 (10/19 0500) SpO2:  [99 %-100 %] 100 % (10/19 0500) Last BM Date: 02/13/16  Intake/Output from previous day: 10/18 0701 - 10/19 0700 In: 1140 [P.O.:1140] Out: 1225 [Urine:1025; Stool:200] Intake/Output this shift: No intake/output data recorded.  PE: Gen:  Alert, NAD, pleasant and cooperative Abd: Soft, appropriately tender, ND, very good BS in all four quadrants, incisions c/d/i, ileostomy pouch in RUQ with gas in pouch and brown stool; stoma red/pink in center with some surrounding slough.  Lab Results:   Recent Labs  02/12/16 0517 02/13/16 0531  WBC 7.8 7.6  HGB 9.6* 9.8*  HCT 29.8* 30.4*  PLT 274 288   BMET  Recent Labs  02/12/16 0517 02/13/16 0531  NA 135 131*  K 4.3 4.2  CL 102 98*  CO2 28 27  GLUCOSE 101* 88  BUN 6 7  CREATININE 0.57 0.55  CALCIUM 8.4* 8.4*   CMP     Component Value Date/Time   NA 131 (L) 02/13/2016 0531   NA 138 12/06/2014 0804   K 4.2 02/13/2016 0531   K 4.1 12/06/2014 0804   CL 98 (L) 02/13/2016 0531   CO2 27 02/13/2016 0531   CO2 25 12/06/2014 0804   GLUCOSE 88 02/13/2016 0531   GLUCOSE 92 12/06/2014 0804   BUN 7 02/13/2016 0531   BUN 12.3 12/06/2014 0804   CREATININE 0.55 02/13/2016 0531   CREATININE 0.8 12/06/2014 0804   CALCIUM 8.4 (L) 02/13/2016 0531   CALCIUM 9.4 12/06/2014 0804   PROT 5.2  (L) 02/12/2016 0517   PROT 6.5 12/06/2014 0804   ALBUMIN 2.5 (L) 02/12/2016 0517   ALBUMIN 3.3 (L) 12/06/2014 0804   AST 13 (L) 02/12/2016 0517   AST 9 12/06/2014 0804   ALT 14 02/12/2016 0517   ALT 10 12/06/2014 0804   ALKPHOS 57 02/12/2016 0517   ALKPHOS 89 12/06/2014 0804   BILITOT 0.5 02/12/2016 0517   BILITOT 0.37 12/06/2014 0804   GFRNONAA >60 02/13/2016 0531   GFRAA >60 02/13/2016 0531   Lipase     Component Value Date/Time   LIPASE 31 04/15/2011 1700   Anti-infectives: Anti-infectives    Start     Dose/Rate Route Frequency Ordered Stop   02/11/16 1600  piperacillin-tazobactam (ZOSYN) IVPB 3.375 g     3.375 g 100 mL/hr over 30 Minutes Intravenous Every 8 hours 02/11/16 1403 02/11/16 1614   02/11/16 1000  tinidazole (TINDAMAX) tablet 500 mg     500 mg Oral 2 times daily 02/08/16 1124     02/10/16 1400  neomycin (MYCIFRADIN) tablet 1,000 mg     1,000 mg Oral 3 times daily 02/08/16 1603 02/10/16 2045   02/10/16 1400  metroNIDAZOLE (FLAGYL) tablet 500 mg  Status:  Discontinued     500 mg Oral 3 times daily 02/08/16 1603 02/10/16 0802   02/08/16 2000  metroNIDAZOLE (FLAGYL) tablet 500  mg  Status:  Discontinued     500 mg Oral Every 8 hours 02/08/16 1613 02/11/16 1500   02/02/16 1100  metroNIDAZOLE (FLAGYL) tablet 500 mg  Status:  Discontinued     500 mg Oral Every 12 hours 02/02/16 1024 02/08/16 1611   02/02/16 1100  ciprofloxacin (CIPRO) tablet 500 mg     500 mg Oral 2 times daily 02/02/16 1024     01/31/16 1400  fluconazole (DIFLUCAN) tablet 200 mg     200 mg Oral  Once 01/31/16 1204     01/31/16 0000  imipenem-cilastatin (PRIMAXIN) 500 mg in sodium chloride 0.9 % 100 mL IVPB  Status:  Discontinued     500 mg 200 mL/hr over 30 Minutes Intravenous Every 6 hours 01/30/16 1840 01/31/16 0957   01/30/16 1900  imipenem-cilastatin (PRIMAXIN) 500 mg in sodium chloride 0.9 % 100 mL IVPB     500 mg 200 mL/hr over 30 Minutes Intravenous STAT 01/30/16 1840 01/30/16 1956    01/29/16 2200  metroNIDAZOLE (FLAGYL) tablet 500 mg  Status:  Discontinued     500 mg Oral Every 8 hours 01/29/16 1700 01/30/16 1829   01/29/16 2000  ciprofloxacin (CIPRO) tablet 500 mg  Status:  Discontinued     500 mg Oral 2 times daily 01/29/16 1700 01/30/16 1829   01/22/16 0900  piperacillin-tazobactam (ZOSYN) IVPB 3.375 g  Status:  Discontinued     3.375 g 12.5 mL/hr over 240 Minutes Intravenous Every 8 hours 01/22/16 0840 01/29/16 1700   01/21/16 1830  ciprofloxacin (CIPRO) IVPB 400 mg  Status:  Discontinued     400 mg 200 mL/hr over 60 Minutes Intravenous Every 12 hours 01/21/16 1744 01/22/16 0840   01/21/16 1830  metroNIDAZOLE (FLAGYL) IVPB 500 mg  Status:  Discontinued     500 mg 100 mL/hr over 60 Minutes Intravenous Every 6 hours 01/21/16 1744 01/22/16 0840     Assessment/Plan POD#1  S/p LAPAROSCOPIC DIVERTED ILEOSTOMY, Dr. Johnathan Hausen, 02/11/16 - WBC WNL - gas and stool in ileostomy pouch - pain controlled on current regimen: Fentanyl, OPANA ER, NORCO - ambulating   Crohn's colitis -second dose Remicade 01/28/16 - prednisone   Perirectal abscess with fistula S/p I&D w/ penrose placement 01/23/16, Dr. Armandina Gemma  - will likely d/c penrose today, continue sitz baths and good hygiene   FEN: soft ID: cipro/flagyl 10/7 >>,  DVT Proph:  heparin, SCD's    LOS: 37 days    Jill Alexanders , [facility] 02/14/2016, 7:49 AM Pager: [phone] Consults: [phone] Mon-Fri 7:00 am-4:30 pm Sat-Sun 7:00 am-11:30 am

## 2016-02-14 NOTE — Progress Notes (Signed)
     [facility] Progress Note  Subjective:  Working on transitioning to PO pain meds.  Surgery to remove Penrose drain at bedside this afternoon.  Patient has decided on location for rehab so I think that she is working towards discharge in the next day or two.  Complaining of a lot of gas pressure this AM so just chewed some gaviscon and took a Bentyl.  Ativan helping with anxiety.  Sitting in chair currently.  Seems in better spirits today.  Objective:  Vital signs in last 24 hours: Temp:  [98 F (36.7 C)-98.5 F (36.9 C)] 98 F (36.7 C) (10/19 0500) Pulse Rate:  [75-79] 75 (10/19 0500) Resp:  [18-20] 20 (10/19 0500) BP: (90-109)/(51-73) 106/73 (10/19 0500) SpO2:  [99 %-100 %] 100 % (10/19 0500) Last BM Date: 02/14/16 General:  Alert, Well-developed, in NAD Heart:  Regular rate and rhythm; no murmurs Pulm:  CTAB.  No W/R/R. Abdomen:  Soft, non-distended.  BS present.  Appropriate TTP around ostomy site. Extremities:  Without edema. Neurologic:  Alert and oriented x 4;  grossly normal neurologically. Psych:  Alert and cooperative. Normal mood and affect.  Intake/Output from previous day: 10/18 0701 - 10/19 0700 In: 1140 [P.O.:1140] Out: 1225 [Urine:1025; Stool:200]  Lab Results:  Recent Labs  02/11/16 1336 02/12/16 0517 02/13/16 0531  WBC 12.9* 7.8 7.6  HGB 10.6* 9.6* 9.8*  HCT 32.3* 29.8* 30.4*  PLT 269 274 288   BMET  Recent Labs  02/11/16 1336 02/12/16 0517 02/13/16 0531  NA 135 135 131*  K 4.3 4.3 4.2  CL 98* 102 98*  CO2 29 28 27   GLUCOSE 115* 101* 88  BUN 7 6 7   CREATININE 0.67 0.57 0.55  CALCIUM 8.3* 8.4* 8.4*   LFT  Recent Labs  02/12/16 0517  PROT 5.2*  ALBUMIN 2.5*  AST 13*  ALT 14  ALKPHOS 68  BILITOT 0.5   Assessment / Plan: 61. 75 year old female with Crohn's colitis complicated by perirectal abscess and fistula status post incision and drainage with packing of complex perirectal abscess, packing was removed next day  and currently has a drain in place.She received second induction dose of Remicade on 01/28/2016, may need IV methyl prednisone 60 mg x 1 dose with next induction dose of Remicade for possible reaction to the last infusion.Patient underwentdiverting end ileostomy on 02/11/16.   -Continue cipro 500 mg BID and tinidazole 500 mg BID.  ? How long to continue. -Next Remicade dose due 10/30 and should only receive 5 mg/kg. -Switched to PO prednisone 30 mg daily starting today, 10/19. -Continue transition to PO pain meds (placed on MS contin 30 mg every 12 hours and has norco for every 6 hrs prn).   LOS: 37 days   [name] D.  02/14/2016, 11:01 AM  Pager number [phone]

## 2016-02-14 NOTE — Progress Notes (Signed)
PT Cancellation Note  Patient Details Name: Sara Barnes MRN: [id number] DOB: 1940-07-21   Cancelled Treatment:    Reason Eval/Treat Not Completed: Attempted PT eval-pt declined to participate at this time. Will check back later as schedule allows. Thanks.    Weston Anna, MPT Pager: [phone]

## 2016-02-14 NOTE — Consult Note (Addendum)
Sara Barnes ostomy follow up Stoma type/location: RUQ Loop Ileostomy Stomal assessment/size: Red/pink 1 1/4"  Peristomal assessment: Intact Treatment options for stomal/peristomal skin: Cleansed with warm wet wash cloth/ convex pouch Output: Brown soft stool Ostomy pouching: 1pc/ convex pouch  Education provided: Cleansing around stoma with soap and water, pat dry, do not use baby wipes, cutting out pouch opening, emptying and closing pouch. Patient demonstrated cutting out pouch and rolled closure. Discussed frequency of pouch changes.   Enrolled patient in [place] Start Discharge program: Yes [place] team will follow.  Sara Moras RN BSN [place] Pager [phone]

## 2016-02-14 NOTE — Progress Notes (Addendum)
PROGRESS NOTE    Sara Barnes  IRJ:[id number] DOB: 1941/02/04 DOA: 01/07/2016 PCP: Ann Held, DO    Brief Narrative:  Lynea Rollison [name] a 75 y.o.femalewith medical history significant of ulcerative colitis versus Crohn's disease, believed to be UC eventually,otherwise healthy, presents to the emergency room with chief complaint of severe abdominal pain for the past several days. She was recently hospitalized and discharged home about 9 days prior to admission. She was here in the hospital with similar complaints, placed on steroids, and tells me today that she improved some and really wanted to go home. At home, she has had ongoing symptoms, never really well achieved full remission, and over the last few days she has felt extremely unwell. She has crampy abdominal pain, nausea, poor appetite, barely able to keep down some Gatorade, and has been having significant amounts of bloody bowel movements as well. She has no fever or chills. She has no chest pain or shortness of breath. She denies any palpitations. She has no lightheadedness or dizziness. She feels generalized weakness. In the emergency room her vital signs are stable, her blood work is essentially unremarkable. Gastroenterology was consulted by EDP and have evaluated patient, and TRH was asked for admission.   Interim history Gastroenterology consultation evaluated the patient. She underwent flexible sigmoidoscopy. CMV was negative and she was started on Remicade. She was initially doing better and tolerating some diet and since still having a lot of pain and frequent Bms she was dosed with another Remicaide infusion on 9/17. On 9/21 pain and bloody clots worsened overnight. CT abd/pelvis showed perirectal abscess- General surgery was consulted. Patient underwent I&D on 9/26. Steroids were decreased per GI recommendations. Patient received dose of amikacin on 01/28/2016. Clinically continuing to improve. Patient is working with  physical therapy  UC vs crohn's flare, perirectal abscess s/p drain, S/p remicade, Currently on IV Solu-Medrol, cipro and tinidazole.  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 Started on clear liquid diet 10/17.    Assessment & Plan:   Principal Problem:   Ulcerative pancolitis with abscess [facility]) Active Problems:   Perirectal abscess   Rectal pain   Ulcerative pancolitis with rectal bleeding (HCC)   IBS (irritable bowel syndrome)   Abdominal pain   GI bleed   Left upper quadrant pain   Absolute anemia   Acute blood loss anemia   Difficulty in walking, not elsewhere classified   Other depression due to general medical condition   Fever   Hypokalemia   Ileostomy in place    Generalized anxiety disorder  #1 ulcerative pancolitis with rectal bleeding/perianal abscess Patient status post flexible sigmoidoscopy 01/08/2016 with biopsies and findings consistent with active erosive ulcer. CMV was negative. Patient was given Remicade 01/09/2016, 01/13/2016, 01/28/2016. Patient status post diverting end ileostomy 02/11/2016 and currently tolerating soft diet. Patient currently on oral ciprofloxacin and tinidazole as well as oral prednisone which will need to be tapered over several weeks per GI. NSL IVF. Penrose drain to be removed later on today. Per GI and general surgery.  #2 acute blood loss anemia Status post 4 units packed red blood cells. H&H stable.  #3 hypokalemia Repleted.  #4 pain management Will discontinue IV fentanyl. Transition to oral MS Contin twice a day and continue current regimen of Vicodin for breakthrough pain.  #5 generalized anxiety disorder Ativan as needed.   DVT prophylaxis: Heparin Code Status: Full Family Communication: Updated patient and sister at bedside. Disposition Plan: Home  with home health for skilled nursing facility.   Consultants:   Gen. surgery Dr. Harlow Asa 01/22/2016  Gastroenterology Dr.  Havery Moros 01/07/2016  Procedures:   CT pelvis 01/31/2016  CT abdomen and pelvis 01/21/2016  Chest x-ray 01/30/2016  Acute abdominal series 01/07/2016  Laparoscopic and ileostomy are Dr. Hassell Done 02/11/2016  Flexible sigmoidoscopy 01/08/2016 Dr. Havery Moros  Incision, drainage and packing of complex perirectal abscess per Dr. Harlow Asa 01/22/2016  Status post 4 units packed red blood cells 9/25-26  Antimicrobials:   IV ciprofloxacin 01/21/2016>>>>> 01/22/2016  Oral ciprofloxacin 02/02/2016  IV ciprofloxacin 01/29/2016>>>> 01/30/2016  IV Primaxin 01/30/2016>>>>> 01/31/2016  IV Flagyl 01/21/2016>>.. 01/22/2016  IV Flagyl 01/29/2016>>>>> 02/11/2016  IV Zosyn 01/22/2016>>>> 01/29/2016  IV Zosyn 02/11/2016>>>> 02/11/2016  Oral tinidazole 02/11/2016                 Subjective: Patient states feels better. No nausea. No emesis. Patient complaining of gas. Patient with flatus and stool noted in ostomy bag. Patient denies any chest pain. No shortness of breath.  Objective: Vitals:   02/13/16 1544 02/13/16 1656 02/13/16 2050 02/14/16 0500  BP: (!) 90/51 109/73 108/69 106/73  Pulse: 75  79 75  Resp: 20  18 20   Temp: 98 F (36.7 C)  98.5 F (36.9 C) 98 F (36.7 C)  TempSrc: Oral  Oral Axillary  SpO2: 99%  99% 100%  Weight:      Height:        Intake/Output Summary (Last 24 hours) at 02/14/16 1255 Last data filed at 02/14/16 0805  Gross per 24 hour  Intake              900 ml  Output             1175 ml  Net             -275 ml   Filed Weights   01/07/16 1136 02/08/16 1416  Weight: 72.6 kg (160 lb) 67.3 kg (148 lb 4.8 oz)    Examination:  General exam: Appears calm and comfortable.  Respiratory system: Clear to auscultation. Respiratory effort normal. Cardiovascular system: S1 & S2 heard, RRR. No JVD, murmurs, rubs, gallops or clicks. No pedal edema. Gastrointestinal system: Abdomen is nondistended, soft and nontender. Colostomy bag intact with some stool And  gas noted. No organomegaly or masses felt. Normal bowel sounds heard. Central nervous system: Alert and oriented. No focal neurological deficits. Extremities: Symmetric 5 x 5 power. Skin: No rashes, lesions or ulcers Psychiatry: Judgement and insight appear normal. Mood & affect appropriate.     Data Reviewed: I have personally reviewed following labs and imaging studies  CBC:  Recent Labs Lab 02/10/16 0923 02/11/16 1336 02/12/16 0517 02/13/16 0531  WBC 7.4 12.9* 7.8 7.6  HGB 10.3* 10.6* 9.6* 9.8*  HCT 32.1* 32.3* 29.8* 30.4*  MCV 89.4 90.5 92.0 93.3  PLT 277 269 274 226   Basic Metabolic Panel:  Recent Labs Lab 02/08/16 0627 02/10/16 0923 02/11/16 1336 02/12/16 0517 02/13/16 0531  NA 137 134* 135 135 131*  K 3.3* 4.1 4.3 4.3 4.2  CL 106 97* 98* 102 98*  CO2 28 29 29 28 27   GLUCOSE 99 85 115* 101* 88  BUN 5* 7 7 6 7   CREATININE 0.47 0.65 0.67 0.57 0.55  CALCIUM 7.9* 8.8* 8.3* 8.4* 8.4*  MG  --  2.1  --   --   --    GFR: Estimated Creatinine Clearance: 57.8 mL/min (by C-G formula based on SCr of 0.55  mg/dL). Liver Function Tests:  Recent Labs Lab 02/08/16 0627 02/12/16 0517  AST 16 13*  ALT 16 14  ALKPHOS 54 57  BILITOT 0.5 0.5  PROT 4.5* 5.2*  ALBUMIN 2.1* 2.5*   No results for input(s): LIPASE, AMYLASE in the last 168 hours. No results for input(s): AMMONIA in the last 168 hours. Coagulation Profile: No results for input(s): INR, PROTIME in the last 168 hours. Cardiac Enzymes: No results for input(s): CKTOTAL, CKMB, CKMBINDEX, TROPONINI in the last 168 hours. BNP (last 3 results) No results for input(s): PROBNP in the last 8760 hours. HbA1C: No results for input(s): HGBA1C in the last 72 hours. CBG:  Recent Labs Lab 02/11/16 1437 02/11/16 1625 02/11/16 2045 02/12/16 0740 02/12/16 1136  GLUCAP 115* 130* 131* 105* 99   Lipid Profile: No results for input(s): CHOL, HDL, LDLCALC, TRIG, CHOLHDL, LDLDIRECT in the last 72 hours. Thyroid  Function Tests: No results for input(s): TSH, T4TOTAL, FREET4, T3FREE, THYROIDAB in the last 72 hours. Anemia Panel: No results for input(s): VITAMINB12, FOLATE, FERRITIN, TIBC, IRON, RETICCTPCT in the last 72 hours. Sepsis Labs: No results for input(s): PROCALCITON, LATICACIDVEN in the last 168 hours.  No results found for this or any previous visit (from the past 240 hour(s)).       Radiology Studies: No results found.      Scheduled Meds: . ciprofloxacin  500 mg Oral BID  . dicyclomine  10 mg Oral TID AC & HS  . famotidine  20 mg Oral BID  . feeding supplement (ENSURE ENLIVE)  237 mL Oral Q24H  . fluconazole  200 mg Oral Once  . FLUoxetine  40 mg Oral Daily  . heparin  5,000 Units Subcutaneous Q8H  . hydrocortisone  25 mg Rectal Daily  . mesalamine  1,000 mg Rectal QHS  . midazolam  2 mg Intravenous Once  . morphine  30 mg Oral Q12H  . potassium chloride  40 mEq Oral Daily  . predniSONE  30 mg Oral QAC breakfast  . simethicone  160 mg Oral QID  . tinidazole  500 mg Oral BID   Continuous Infusions:     LOS: 37 days    Time spent: [place], MD [facility] Pager [phone]  If 7PM-7AM, please contact night-coverage [email or web address] Password [email or web address] 02/14/2016, 12:55 PM

## 2016-02-14 NOTE — Progress Notes (Signed)
Agree with earlier shift assessment, no changes noted.

## 2016-02-14 NOTE — Progress Notes (Signed)
Patient bed at [facility] confirmed. Updated Clinicals to faxed in the am.

## 2016-02-14 NOTE — Evaluation (Signed)
Physical Therapy Evaluation Patient Details Name: Sara Barnes MRN: [id number] DOB: [birth year] Today's Date: 02/14/2016   History of Present Illness  75 yo female s/p end ileostomy 10/17  Clinical Impression  On eval, pt required Min guard-Min assist for mobility. She walked ~100 feet with a RW. Pt rated pain 7/10. She participated well with therapy session and she is motivated to get better. Pt currently lives alone. Recommend ST rehab at SNF to maximize independence and safety with functional mobility prior to pt returning home alone. Will follow and progress activity as tolerated.     Follow Up Recommendations SNF    Equipment Recommendations  Rolling walker with 5" wheels    Recommendations for Other Services OT consult     Precautions / Restrictions Precautions Precautions: Fall Restrictions Weight Bearing Restrictions: No      Mobility  Bed Mobility Overal bed mobility: Needs Assistance Bed Mobility: Supine to Sit;Sit to Supine     Supine to sit: Min guard;HOB elevated Sit to supine: Min guard;HOB elevated   General bed mobility comments: close guard for safety. Increased time.   Transfers Overall transfer level: Needs assistance Equipment used: Rolling walker (2 wheeled) Transfers: Sit to/from Stand   Sit to Stand: Min guard       General transfer comment: close guard for safety. VCs safety  Ambulation/Gait Ambulation/Gait assistance: Min assist Ambulation Distance (Feet): 100 Feet Assistive device: Rolling walker (2 wheeled) Gait Pattern/deviations: Step-through pattern;Decreased stride length     General Gait Details: intermittent assist to stabilize. Pt tolerated distance well.   Stairs            Wheelchair Mobility    Modified Rankin (Stroke Patients Only)       Balance Overall balance assessment: Needs assistance;History of Falls           Standing balance-Leahy Scale: Poor                                Pertinent Vitals/Pain Pain Assessment: 0-10 Pain Score: 7  Pain Location: bottom Pain Descriptors / Indicators: Sore Pain Intervention(s): Monitored during session    Home Living Family/patient expects to be discharged to:: Skilled nursing facility Living Arrangements: Alone Available Help at Discharge: Family   Home Access: Stairs to enter   Technical brewer of Steps: 4 Home Layout: Multi-level Home Equipment: None      Prior Function Level of Independence: Independent               Hand Dominance        Extremity/Trunk Assessment   Upper Extremity Assessment: Defer to OT evaluation           Lower Extremity Assessment: Generalized weakness      Cervical / Trunk Assessment: Normal  Communication   Communication: HOH  Cognition Arousal/Alertness: Awake/alert Behavior During Therapy: WFL for tasks assessed/performed Overall Cognitive Status: Within Functional Limits for tasks assessed                      General Comments      Exercises     Assessment/Plan    PT Assessment Patient needs continued PT services  PT Problem List Decreased mobility;Pain;Decreased balance;Decreased strength;Decreased knowledge of use of DME          PT Treatment Interventions DME instruction;Gait training;Therapeutic activities;Therapeutic exercise;Patient/family education;Balance training;Functional mobility training    PT Goals (Current goals can be found in the  Care Plan section)  Acute Rehab PT Goals Patient Stated Goal: regain PLOF/independence PT Goal Formulation: With patient/family Time For Goal Achievement: 02/28/16 Potential to Achieve Goals: Good    Frequency Min 3X/week   Barriers to discharge        Co-evaluation               End of Session   Activity Tolerance: Patient tolerated treatment well Patient left: in bed;with call bell/phone within reach;with family/visitor present           Time: 0920-0415 PT  Time Calculation (min) (ACUTE ONLY): 10 min   Charges:   PT Evaluation $PT Eval Low Complexity: 1 Procedure     PT G Codes:        Weston Anna, MPT Pager: [phone]

## 2016-02-15 ENCOUNTER — Telehealth: Payer: Self-pay | Admitting: Internal Medicine

## 2016-02-15 ENCOUNTER — Inpatient Hospital Stay (HOSPITAL_COMMUNITY): Payer: Commercial Managed Care - HMO

## 2016-02-15 DIAGNOSIS — F411 Generalized anxiety disorder: Secondary | ICD-10-CM

## 2016-02-15 LAB — CBC
HCT: 38.9 % (ref 36.0–46.0)
Hemoglobin: 12.7 g/dL (ref 12.0–15.0)
MCH: 29.3 pg (ref 26.0–34.0)
MCHC: 32.6 g/dL (ref 30.0–36.0)
MCV: 89.6 fL (ref 78.0–100.0)
PLATELETS: 383 10*3/uL (ref 150–400)
RBC: 4.34 MIL/uL (ref 3.87–5.11)
RDW: 15.2 % (ref 11.5–15.5)
WBC: 17.6 10*3/uL — ABNORMAL HIGH (ref 4.0–10.5)

## 2016-02-15 LAB — BASIC METABOLIC PANEL
Anion gap: 9 (ref 5–15)
BUN: 7 mg/dL (ref 6–20)
CALCIUM: 9.2 mg/dL (ref 8.9–10.3)
CO2: 28 mmol/L (ref 22–32)
CREATININE: 0.79 mg/dL (ref 0.44–1.00)
Chloride: 97 mmol/L — ABNORMAL LOW (ref 101–111)
GFR calc Af Amer: >60 mL/min (ref >60)
GLUCOSE: 90 mg/dL (ref 65–99)
Potassium: 3.8 mmol/L (ref 3.5–5.1)
SODIUM: 134 mmol/L — AB (ref 135–145)

## 2016-02-15 MED ORDER — MORPHINE SULFATE (PF) 2 MG/ML IV SOLN
2.0000 mg | Freq: Once | INTRAVENOUS | Status: AC
Start: 2016-02-15 — End: 2016-02-15
  Administered 2016-02-15: 2 mg via INTRAVENOUS
  Filled 2016-02-15: qty 1

## 2016-02-15 MED ORDER — MORPHINE SULFATE (PF) 2 MG/ML IV SOLN
2.0000 mg | INTRAVENOUS | Status: DC | PRN
  Administered 2016-02-15 – 2016-02-28 (×21): 2 mg via INTRAVENOUS
  Filled 2016-02-15 (×22): qty 1

## 2016-02-15 MED ORDER — SODIUM CHLORIDE 0.9 % IV SOLN
INTRAVENOUS | Status: DC
  Administered 2016-02-15 – 2016-02-17 (×3): via INTRAVENOUS

## 2016-02-15 MED ORDER — SENNOSIDES-DOCUSATE SODIUM 8.6-50 MG PO TABS
2.0000 | ORAL_TABLET | Freq: Two times a day (BID) | ORAL | Status: DC
  Administered 2016-02-15 – 2016-02-28 (×19): 2 via ORAL
  Filled 2016-02-15 (×24): qty 2

## 2016-02-15 MED ORDER — MORPHINE SULFATE (PF) 4 MG/ML IV SOLN
4.0000 mg | Freq: Once | INTRAVENOUS | Status: AC
  Administered 2016-02-15: 4 mg via INTRAVENOUS
  Filled 2016-02-15: qty 1

## 2016-02-15 MED ORDER — PROMETHAZINE HCL 25 MG/ML IJ SOLN
12.5000 mg | Freq: Once | INTRAMUSCULAR | Status: AC
  Administered 2016-02-15: 12.5 mg via INTRAVENOUS
  Filled 2016-02-15: qty 1

## 2016-02-15 MED ORDER — ALUM & MAG HYDROXIDE-SIMETH 200-200-20 MG/5ML PO SUSP
30.0000 mL | ORAL | Status: DC | PRN
  Filled 2016-02-15: qty 30

## 2016-02-15 MED ORDER — SENNOSIDES-DOCUSATE SODIUM 8.6-50 MG PO TABS
1.0000 | ORAL_TABLET | Freq: Two times a day (BID) | ORAL | Status: DC
  Administered 2016-02-15: 1 via ORAL
  Filled 2016-02-15: qty 1

## 2016-02-15 MED ORDER — MORPHINE SULFATE 15 MG PO TABS
15.0000 mg | ORAL_TABLET | ORAL | Status: DC | PRN
  Administered 2016-02-15 – 2016-02-16 (×2): 30 mg via ORAL
  Administered 2016-02-18: 15 mg via ORAL
  Administered 2016-02-19 – 2016-02-29 (×2): 30 mg via ORAL
  Filled 2016-02-15: qty 1
  Filled 2016-02-15 (×4): qty 2

## 2016-02-15 NOTE — Consult Note (Addendum)
[place] Nurse ostomy follow up Stoma type/location: RUQ Ileostomy Scheduled time for ostomy teaching. Pouch was changed yesterday with patient assisting.  Planned to practice cutting barrier as patient verbalizes need to get more comfortable with that.  However, this AM she states her pain is too severe.  She is unable to work on this skill at this time.  Written materials are present at the bedside.  Patient agrees to continue emptying pouch, cleaning outside of roll closure and closing the pouch with staff assist.  [facility] team will continue to follow.  Domenic Moras RN BSN [place] Pager [phone]

## 2016-02-15 NOTE — Progress Notes (Signed)
Pt had episode of severe pain. Gave 2 tabs of 10-325 Norco. Pain unrelieved. Gave 1 time dose of 59m IV Morphine as well as PRN Ativan, and Zofran. Will continue to monitor and chart if additional episodes occur.

## 2016-02-15 NOTE — [place] FL2 (Signed)
[place] LEVEL OF CARE SCREENING TOOL     IDENTIFICATION  Patient Name: Sara Barnes Birthdate: [birth year] Sex: female Admission Date (Current Location): 01/07/2016  [facility] and [place] Number:  Herbalist and Address:  [facility],  [place] [address] [place]      Provider Number: [id number]  Attending Physician Name and Address:  Eugenie Filler, MD  Relative Name and Phone Number:       Current Level of Care: Hospital Recommended Level of Care: [place] Prior Approval Number:    Date Approved/Denied:   PASRR Number:    Discharge Plan: SNF    Current Diagnoses: Patient Active Problem List   Diagnosis Date Noted  . Generalized anxiety disorder 02/14/2016  . 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 Hypokalemia   . Fever   . Other depression due to general medical condition   . Difficulty in walking, not elsewhere classified   . Acute blood loss anemia   . Absolute anemia   . Left upper quadrant pain   . Abdominal pain 01/07/2016  . GI bleed 01/07/2016  . IBS (irritable bowel syndrome)   . [place]   . Ulcerative pancolitis with rectal bleeding ([place])   . Diarrhea 12/24/2015  . Crohn disease ([place]) 12/24/2015  . Ulcerative pancolitis with abscess ([place])   . Rectal pain   . Leucocytosis   . Cancer of lower-inner quadrant of left female breast ([place]) 11/30/2014  . Perirectal abscess 09/29/2013  . Depression 08/12/2006    Orientation RESPIRATION BLADDER Height & Weight     Self, Time, Situation, Place  Normal Continent Weight: 148 lb 4.8 oz (67.3 kg) (Standing Weight) Height:  5' 6"  (167.6 cm)  BEHAVIORAL SYMPTOMS/MOOD NEUROLOGICAL BOWEL NUTRITION STATUS      Colostomy Diet (See Discharge Summary)  AMBULATORY STATUS COMMUNICATION OF NEEDS Skin   Limited Assist Verbally Normal                       Personal Care Assistance Level of Assistance  Bathing, Feeding, Dressing Bathing Assistance:  Limited assistance Feeding assistance: Independent Dressing Assistance: Limited assistance     Functional Limitations Info  Sight, Hearing, Speech Sight Info: Adequate Hearing Info: Adequate Speech Info: Adequate    SPECIAL CARE FACTORS FREQUENCY  PT (By licensed PT), OT (By licensed OT)     PT Frequency: 5 OT Frequency: None Recommended            Contractures Contractures Info: Not present    Additional Factors Info  Code Status, Allergies Code Status Info: FullCode Allergies Info: Codeine, Mesalamine, Other, Oxycodone-acetaminophen, Sulfa Antibiotics           Current Medications (02/15/2016):  This is the current hospital active medication list Current Facility-Administered Medications  Medication Dose Route Frequency Provider Last Rate Last Dose  . acetaminophen (TYLENOL) tablet 650 mg  650 mg Oral Q6H PRN Florencia Reasons, MD   650 mg at 01/30/16 1417  . alum & mag hydroxide-simeth (MAALOX/MYLANTA) 200-200-20 MG/5ML suspension 30 mL  30 mL Oral PRN Milly Jakob, MD      . ciprofloxacin (CIPRO) tablet 500 mg  500 mg Oral BID Mauri Pole, MD   500 mg at 02/15/16 0823  . dicyclomine (BENTYL) capsule 10 mg  10 mg Oral TID AC & HS Amy S Esterwood, PA-C   10 mg at 02/15/16 0824  . famotidine (PEPCID) tablet 20 mg  20 mg Oral  BID Eugenie Filler, MD   20 mg at 02/15/16 1035  . feeding supplement (ENSURE ENLIVE) (ENSURE ENLIVE) liquid 237 mL  237 mL Oral Q24H Dron Tanna Furry, MD   237 mL at 02/12/16 1510  . fluconazole (DIFLUCAN) tablet 200 mg  200 mg Oral Once Florencia Reasons, MD      . FLUoxetine (PROZAC) capsule 40 mg  40 mg Oral Daily Jerene Bears, MD   40 mg at 02/15/16 1037  . fluticasone (FLONASE) 50 MCG/ACT nasal spray 2 spray  2 spray Each Nare Daily PRN Maryann Mikhail, DO      . heparin injection 5,000 Units  5,000 Units Subcutaneous Q8H Johnathan Hausen, MD   5,000 Units at 02/15/16 0543  . hydrocortisone (ANUSOL-HC) suppository 25 mg  25 mg Rectal Daily Johnathan Hausen, MD      . LORazepam (ATIVAN) tablet 0.5 mg  0.5 mg Oral Q8H PRN Eugenie Filler, MD   0.5 mg at 02/15/16 0053  . mesalamine (CANASA) suppository 1,000 mg  1,000 mg Rectal QHS Johnathan Hausen, MD   1,000 mg at 02/13/16 2113  . midazolam (VERSED) injection 2 mg  2 mg Intravenous Once Earnstine Regal, PA-C      . morphine (MS CONTIN) 12 hr tablet 30 mg  30 mg Oral Q12H Eugenie Filler, MD   30 mg at 02/15/16 1038  . morphine (MSIR) tablet 15-30 mg  15-30 mg Oral Q4H PRN Eugenie Filler, MD      . morphine 2 MG/ML injection 2 mg  2 mg Intravenous Q4H PRN Eugenie Filler, MD      . ondansetron [facility]) injection 4 mg  4 mg Intravenous Q6H PRN Amy S Esterwood, PA-C   4 mg at 02/11/16 1056  . ondansetron (ZOFRAN-ODT) disintegrating tablet 4 mg  4 mg Oral Q6H PRN Johnathan Hausen, MD       Or  . ondansetron [facility]) injection 4 mg  4 mg Intravenous Q6H PRN Johnathan Hausen, MD   4 mg at 02/15/16 0049  . potassium chloride SA (K-DUR,KLOR-CON) CR tablet 40 mEq  40 mEq Oral Daily Dron Tanna Furry, MD   40 mEq at 02/15/16 1036  . predniSONE (DELTASONE) tablet 30 mg  30 mg Oral QAC breakfast Laban Emperor Zehr, PA-C   30 mg at 02/15/16 5208  . senna-docusate (Senokot-S) tablet 1 tablet  1 tablet Oral BID Eugenie Filler, MD   1 tablet at 02/15/16 1035  . simethicone (MYLICON) chewable tablet 160 mg  160 mg Oral QID Eugenie Filler, MD   160 mg at 02/15/16 1035  . tinidazole (TINDAMAX) tablet 500 mg  500 mg Oral BID Jerene Bears, MD   500 mg at 02/15/16 1036  . zolpidem (AMBIEN) tablet 5 mg  5 mg Oral QHS PRN Dionne Milo, NP   5 mg at 02/14/16 2102     Discharge Medications: Please see discharge summary for a list of discharge medications.  Relevant Imaging Results:  Relevant Lab Results:   Additional Information ss#[id number]  Lia Hopping, LCSW

## 2016-02-15 NOTE — Progress Notes (Signed)
     [facility] Progress Note  Subjective:  Had a bad night with vomiting and severe abdominal pain.  Not passing gas and stool in ostomy.  Abdominal x-ray shows ileus vs evolving SBO.  Objective:  Vital signs in last 24 hours: Temp:  [97.7 F (36.5 C)-98 F (36.7 C)] 97.7 F (36.5 C) (10/20 0430) Pulse Rate:  [80-116] 116 (10/20 0430) Resp:  [18-20] 18 (10/20 0430) BP: (98-121)/(72-87) 121/87 (10/20 0430) SpO2:  [97 %-98 %] 97 % (10/20 0430) Last BM Date: 02/14/16 General:  Alert, Well-developed, in NAD Heart:  Tachy.  No murmurs. Pulm:  CTAB.  No W/R/R. Abdomen:  Soft, slightly distended.  BS very quiet.  Diffuse TTP. Extremities:  Without edema. Neurologic:  Alert and oriented x 4;  grossly normal neurologically. Psych:  Alert and cooperative. Normal mood and affect.  Intake/Output from previous day: 10/19 0701 - 10/20 0700 In: 480 [P.O.:480] Out: 65 [Stool:65] Intake/Output this shift: No intake/output data recorded.  Lab Results:  Recent Labs  02/13/16 0531 02/15/16 0550  WBC 7.6 17.6*  HGB 9.8* 12.7  HCT 30.4* 38.9  PLT 288 383   BMET  Recent Labs  02/13/16 0531 02/15/16 0550  NA 131* 134*  K 4.2 3.8  CL 98* 97*  CO2 27 28  GLUCOSE 88 90  BUN 7 7  CREATININE 0.55 0.79  CALCIUM 8.4* 9.2   LFT No results for input(s): PROT, ALBUMIN, AST, ALT, ALKPHOS, BILITOT, BILIDIR, IBILI in the last 72 hours. PT/INR No results for input(s): LABPROT, INR in the last 72 hours. Hepatitis Panel No results for input(s): HEPBSAG, HCVAB, HEPAIGM, HEPBIGM in the last 72 hours.  No results found.  Assessment / Plan: 3. 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  Abdominal x-ray now shows ileus vs evolving SBO.  -Will likely need IV meds and fluids started back again with bowel rest. -Next Remicade dose due 10/30 and should only receive 5 mg/kg.    LOS: 38 days   Aris Even D.  02/15/2016, 11:43 AM  Pager number [phone]

## 2016-02-15 NOTE — Progress Notes (Signed)
LCSWA updated patient FL2 information, faxed clinical information to [place] notified.  Authorization needed prior to admission.   SNF-No Sunday admission.

## 2016-02-15 NOTE — Progress Notes (Signed)
Occupational Therapy Treatment Patient Details Name: Sara Barnes MRN: [id number] DOB: Oct 20, 1940 Today's Date: 02/15/2016    History of present illness 75 yo female s/p end ileostomy 10/17   OT comments  Pt had a bad night with pain and vomiting.     Equipment Recommendations  None recommended by OT       Precautions / Restrictions Precautions Precautions: Fall Restrictions Weight Bearing Restrictions: No              ADL Overall ADL's : Needs assistance/impaired                                       General ADL Comments: OT  focused on ADL task related to ostomy.  Wound care nurse had been in to practice this am and pt refused and felt like nurse was assigning her this task.   OT discussed with sister and has paged wound care nurse to coordinate with sister for these session in order for pt to be most successful.  Pt wants to be more I with this activity as it is one of her ADL activities                Cognition   Behavior During Therapy: WFL for tasks assessed/performed Overall Cognitive Status: Within Functional Limits for tasks assessed                               General Comments      Pertinent Vitals/ Pain       Pain Score: 5  Pain Location: stomach Pain Descriptors / Indicators: Sore Pain Intervention(s): Limited activity within patient's tolerance         Frequency  Min 2X/week        Progress Toward Goals  OT Goals(current goals can now be found in the care plan section)  Progress towards OT goals: Progressing toward goals  Acute Rehab OT Goals Patient Stated Goal: regain PLOF/independence  Plan Discharge plan remains appropriate       End of Session     Activity Tolerance Patient limited by pain   Patient Left in bed;with call bell/phone within reach;with family/visitor present   Nurse Communication Mobility status        Time: 3094-0768 OT Time Calculation (min): 13 min  Charges: OT  General Charges $OT Visit: 1 Procedure OT Treatments $Self Care/Home Management : 8-22 mins  [place], Sara Barnes 02/15/2016, 9:55 AM

## 2016-02-15 NOTE — Progress Notes (Signed)
PROGRESS NOTE    Sara Barnes  KGM:[id number] DOB: [birth year] DOA: 01/07/2016 PCP: Ann Held, DO    Brief Narrative:  Sara Barnes a 75 y.o.femalewith medical history significant of ulcerative colitis versus Crohn's disease, believed to be UC eventually,otherwise healthy, presents to the emergency room with chief complaint of severe abdominal pain for the past several days. She was recently hospitalized and discharged home about 9 days prior to admission. She was here in the hospital with similar complaints, placed on steroids, and tells me today that she improved some and really wanted to go home. At home, she has had ongoing symptoms, never really well achieved full remission, and over the last few days she has felt extremely unwell. She has crampy abdominal pain, nausea, poor appetite, barely able to keep down some Gatorade, and has been having significant amounts of bloody bowel movements as well. She has no fever or chills. She has no chest pain or shortness of breath. She denies any palpitations. She has no lightheadedness or dizziness. She feels generalized weakness. In the emergency room her vital signs are stable, her blood work is essentially unremarkable. Gastroenterology was consulted by EDP and have evaluated patient, and TRH was asked for admission.   Interim history Gastroenterology consultation evaluated the patient. She underwent flexible sigmoidoscopy. CMV was negative and she was started on Remicade. She was initially doing better and tolerating some diet and since still having a lot of pain and frequent Bms she was dosed with another Remicaide infusion on 9/17. On 9/21 pain and bloody clots worsened overnight. CT abd/pelvis showed perirectal abscess- General surgery was consulted. Patient underwent I&D on 9/26. Steroids were decreased per GI recommendations. Patient received dose of amikacin on 01/28/2016. Clinically continuing to improve. Patient is working with  physical therapy  UC vs crohn's flare, perirectal abscess s/p drain, S/p remicade, Currently on IV Solu-Medrol, cipro and tinidazole.  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 Started on clear liquid diet 10/17.    Assessment & Plan:   Principal Problem:   Ulcerative pancolitis with abscess [facility]) Active Problems:   Perirectal abscess   Rectal pain   Ulcerative pancolitis with rectal bleeding ([facility])   IBS (irritable bowel syndrome)   Abdominal pain   GI bleed   Left upper quadrant pain   Absolute anemia   Acute blood loss anemia   Difficulty in walking, not elsewhere classified   Other depression due to general medical condition   Fever   Hypokalemia   Ileostomy in place    Generalized anxiety disorder  #1 ulcerative pancolitis with rectal bleeding/perianal abscess Patient status post flexible sigmoidoscopy 01/08/2016 with biopsies and findings consistent with active erosive ulcer. CMV was negative. Patient was given Remicade 01/09/2016, 01/13/2016, 01/28/2016. Patient status post diverting end ileostomy 02/11/2016. Patient with emesis and significant pain. Colostomy without air noted. Abdominal films ordered per general surgery. Patient currently on oral ciprofloxacin and tinidazole as well as oral prednisone which will need to be tapered over several weeks per GI. NSL IVF. Penrose drain discontinued yesterday. Per GI and general surgery.  #2 acute blood loss anemia Status post 4 units packed red blood cells. H&H stable.  #3 hypokalemia Repleted.  #4 pain management Continue oral MS Contin twice a day for long-acting regimen. Discontinue Vicodin. Place on MSIR 15-63m every 4 hours as needed when necessary breakthrough pain. IV morphine when necessary severe pain breakthrough.   #5 generalized anxiety disorder Ativan as needed.  DVT prophylaxis: Heparin Code Status: Full Family Communication: Updated patient and sister at  bedside. Disposition Plan: skilled nursing facility.   Consultants:   Gen. surgery Dr. Harlow Asa 01/22/2016  Gastroenterology Dr. Havery Moros 01/07/2016  Procedures:   CT pelvis 01/31/2016  CT abdomen and pelvis 01/21/2016  Chest x-ray 01/30/2016  Acute abdominal series 01/07/2016  Laparoscopic and ileostomy are Dr. Hassell Done 02/11/2016  Flexible sigmoidoscopy 01/08/2016 Dr. Havery Moros  Incision, drainage and packing of complex perirectal abscess per Dr. Harlow Asa 01/22/2016  Status post 4 units packed red blood cells 9/25-26  Abdominal x-ray 02/15/2016  Antimicrobials:   IV ciprofloxacin 01/21/2016>>>>> 01/22/2016  Oral ciprofloxacin 02/02/2016  IV ciprofloxacin 01/29/2016>>>> 01/30/2016  IV Primaxin 01/30/2016>>>>> 01/31/2016  IV Flagyl 01/21/2016>>.. 01/22/2016  IV Flagyl 01/29/2016>>>>> 02/11/2016  IV Zosyn 01/22/2016>>>> 01/29/2016  IV Zosyn 02/11/2016>>>> 02/11/2016  Oral tinidazole 02/11/2016                 Subjective: Patient states had a bad night last night with emesis and worsening abdominal pain. feels better. Patient denies any chest pain. No shortness of breath.  Objective: Vitals:   02/14/16 0500 02/14/16 1532 02/14/16 1957 02/15/16 0430  BP: 106/73 98/72 114/74 121/87  Pulse: 75 80 81 (!) 116  Resp: 20 20 18 18   Temp: 98 F (36.7 C) 97.9 F (36.6 C) 98 F (36.7 C) 97.7 F (36.5 C)  TempSrc: Axillary Oral Oral Oral  SpO2: 100% 98% 98% 97%  Weight:      Height:        Intake/Output Summary (Last 24 hours) at 02/15/16 1236 Last data filed at 02/14/16 2025  Gross per 24 hour  Intake              240 ml  Output               45 ml  Net              195 ml   Filed Weights   01/07/16 1136 02/08/16 1416  Weight: 72.6 kg (160 lb) 67.3 kg (148 lb 4.8 oz)    Examination:  General exam: Appears calm and comfortable.  Respiratory system: Clear to auscultation. Respiratory effort normal. Cardiovascular system: S1 & S2 heard, RRR. No JVD,  murmurs, rubs, gallops or clicks. No pedal edema. Gastrointestinal system: Abdomen is nondistended, soft and some tenderness to palpation. Colostomy bag intact with no gas noted. No organomegaly or masses felt. Normal bowel sounds heard. Central nervous system: Alert and oriented. No focal neurological deficits. Extremities: Symmetric 5 x 5 power. Skin: No rashes, lesions or ulcers Psychiatry: Judgement and insight appear normal. Mood & affect appropriate.     Data Reviewed: I have personally reviewed following labs and imaging studies  CBC:  Recent Labs Lab 02/10/16 0923 02/11/16 1336 02/12/16 0517 02/13/16 0531 02/15/16 0550  WBC 7.4 12.9* 7.8 7.6 17.6*  HGB 10.3* 10.6* 9.6* 9.8* 12.7  HCT 32.1* 32.3* 29.8* 30.4* 38.9  MCV 89.4 90.5 92.0 93.3 89.6  PLT 277 269 274 288 785   Basic Metabolic Panel:  Recent Labs Lab 02/10/16 0923 02/11/16 1336 02/12/16 0517 02/13/16 0531 02/15/16 0550  NA 134* 135 135 131* 134*  K 4.1 4.3 4.3 4.2 3.8  CL 97* 98* 102 98* 97*  CO2 29 29 28 27 28   GLUCOSE 85 115* 101* 88 90  BUN 7 7 6 7 7   CREATININE 0.65 0.67 0.57 0.55 0.79  CALCIUM 8.8* 8.3* 8.4* 8.4* 9.2  MG 2.1  --   --   --   --  GFR: Estimated Creatinine Clearance: 57.8 mL/min (by C-G formula based on SCr of 0.79 mg/dL). Liver Function Tests:  Recent Labs Lab 02/12/16 0517  AST 13*  ALT 14  ALKPHOS 57  BILITOT 0.5  PROT 5.2*  ALBUMIN 2.5*   No results for input(s): LIPASE, AMYLASE in the last 168 hours. No results for input(s): AMMONIA in the last 168 hours. Coagulation Profile: No results for input(s): INR, PROTIME in the last 168 hours. Cardiac Enzymes: No results for input(s): CKTOTAL, CKMB, CKMBINDEX, TROPONINI in the last 168 hours. BNP (last 3 results) No results for input(s): PROBNP in the last 8760 hours. HbA1C: No results for input(s): HGBA1C in the last 72 hours. CBG:  Recent Labs Lab 02/11/16 1437 02/11/16 1625 02/11/16 2045 02/12/16 0740  02/12/16 1136  GLUCAP 115* 130* 131* 105* 99   Lipid Profile: No results for input(s): CHOL, HDL, LDLCALC, TRIG, CHOLHDL, LDLDIRECT in the last 72 hours. Thyroid Function Tests: No results for input(s): TSH, T4TOTAL, FREET4, T3FREE, THYROIDAB in the last 72 hours. Anemia Panel: No results for input(s): VITAMINB12, FOLATE, FERRITIN, TIBC, IRON, RETICCTPCT in the last 72 hours. Sepsis Labs: No results for input(s): PROCALCITON, LATICACIDVEN in the last 168 hours.  No results found for this or any previous visit (from the past 240 hour(s)).       Radiology Studies: Dg Abd 2 Views  Result Date: 02/15/2016 CLINICAL DATA:  Nausea and vomiting last night after laparoscopic end ileostomy on 02/11/2016 for Crohn's colitis EXAM: ABDOMEN - 2 VIEW COMPARISON:  CT scan 01/21/2016. FINDINGS: Upright film shows no evidence for intraperitoneal free air. Gaseous dilation of numerous small bowel loops noted with stair stepping air-fluid levels. Small bowel loop in the upper left abdomen is dilated up to 3.9 cm diameter. Supine film shows diffuse gaseous small bowel dilatation with minimal amount of colonic gas evident. Stoma noted right lower quadrant of the abdomen. IMPRESSION: 1. Diffuse gaseous small bowel dilatation compatible with adynamic or inflammatory ileus versus evolving distal small bowel obstruction. 2. No intraperitoneal free air. Electronically Signed   By: Misty Stanley M.D.   On: 02/15/2016 12:32        Scheduled Meds: . ciprofloxacin  500 mg Oral BID  . dicyclomine  10 mg Oral TID AC & HS  . famotidine  20 mg Oral BID  . feeding supplement (ENSURE ENLIVE)  237 mL Oral Q24H  . fluconazole  200 mg Oral Once  . FLUoxetine  40 mg Oral Daily  . heparin  5,000 Units Subcutaneous Q8H  . hydrocortisone  25 mg Rectal Daily  . mesalamine  1,000 mg Rectal QHS  . midazolam  2 mg Intravenous Once  . morphine  30 mg Oral Q12H  . potassium chloride  40 mEq Oral Daily  . predniSONE  30 mg  Oral QAC breakfast  . senna-docusate  1 tablet Oral BID  . simethicone  160 mg Oral QID  . tinidazole  500 mg Oral BID   Continuous Infusions:     LOS: 38 days    Time spent: [place], MD [facility] Pager [phone]  If 7PM-7AM, please contact night-coverage [email or web address] Password [email or web address] 02/15/2016, 12:36 PM

## 2016-02-15 NOTE — Progress Notes (Signed)
Patient ID: Sara Barnes, female   DOB: 12-Apr-1941, 75 y.o.   MRN: [id number] [facility] Surgery Progress Note:   4 Days Post-Op  Subjective: Mental status is clear;  She had a rough night with abdominal distention and vomiting.   Objective: Vital signs in last 24 hours: Temp:  [97.7 F (36.5 C)-98 F (36.7 C)] 97.7 F (36.5 C) (10/20 0430) Pulse Rate:  [80-116] 116 (10/20 0430) Resp:  [18-20] 18 (10/20 0430) BP: (98-121)/(72-87) 121/87 (10/20 0430) SpO2:  [97 %-98 %] 97 % (10/20 0430)  Intake/Output from previous day: 10/19 0701 - 10/20 0700 In: 480 [P.O.:480] Out: 65 [Stool:65] Intake/Output this shift: No intake/output data recorded.  Physical Exam: Work of breathing is not labored.  Sitting up and abdomen is firm.  She may be trying to drink too much.  Ostomy was working well yesterday with gas but today there is no distention of the bag.    Lab Results:  Results for orders placed or performed during the hospital encounter of 01/07/16 (from the past 48 hour(s))  CBC     Status: Abnormal   Collection Time: 02/15/16  5:50 AM  Result Value Ref Range   WBC 17.6 (H) 4.0 - 10.5 K/uL   RBC 4.34 3.87 - 5.11 MIL/uL   Hemoglobin 12.7 12.0 - 15.0 g/dL   HCT 38.9 36.0 - 46.0 %   MCV 89.6 78.0 - 100.0 fL   MCH 29.3 26.0 - 34.0 pg   MCHC 32.6 30.0 - 36.0 g/dL   RDW 15.2 11.5 - 15.5 %   Platelets 383 150 - 400 K/uL  Basic metabolic panel     Status: Abnormal   Collection Time: 02/15/16  5:50 AM  Result Value Ref Range   Sodium 134 (L) 135 - 145 mmol/L   Potassium 3.8 3.5 - 5.1 mmol/L   Chloride 97 (L) 101 - 111 mmol/L   CO2 28 22 - 32 mmol/L   Glucose, Bld 90 65 - 99 mg/dL   BUN 7 6 - 20 mg/dL   Creatinine, Ser 0.79 0.44 - 1.00 mg/dL   Calcium 9.2 8.9 - 10.3 mg/dL   GFR calc non Af Amer >60 >60 mL/min   GFR calc Af Amer >60 >60 mL/min    Comment: (NOTE) The eGFR has been calculated using the CKD EPI equation. This calculation has not been validated in all clinical  situations. eGFR's persistently <60 mL/min signify possible Chronic Kidney Disease.    Anion gap 9 5 - 15    Radiology/Results: No results found.  Anti-infectives: Anti-infectives    Start     Dose/Rate Route Frequency Ordered Stop   02/11/16 1600  piperacillin-tazobactam (ZOSYN) IVPB 3.375 g     3.375 g 100 mL/hr over 30 Minutes Intravenous Every 8 hours 02/11/16 1403 02/11/16 1614   02/11/16 1000  tinidazole (TINDAMAX) tablet 500 mg     500 mg Oral 2 times daily 02/08/16 1124     02/10/16 1400  neomycin (MYCIFRADIN) tablet 1,000 mg     1,000 mg Oral 3 times daily 02/08/16 1603 02/10/16 2045   02/10/16 1400  metroNIDAZOLE (FLAGYL) tablet 500 mg  Status:  Discontinued     500 mg Oral 3 times daily 02/08/16 1603 02/10/16 0802   02/08/16 2000  metroNIDAZOLE (FLAGYL) tablet 500 mg  Status:  Discontinued     500 mg Oral Every 8 hours 02/08/16 1613 02/11/16 1500   02/02/16 1100  metroNIDAZOLE (FLAGYL) tablet 500 mg  Status:  Discontinued  500 mg Oral Every 12 hours 02/02/16 1024 02/08/16 1611   02/02/16 1100  ciprofloxacin (CIPRO) tablet 500 mg     500 mg Oral 2 times daily 02/02/16 1024     01/31/16 1400  fluconazole (DIFLUCAN) tablet 200 mg     200 mg Oral  Once 01/31/16 1204     01/31/16 0000  imipenem-cilastatin (PRIMAXIN) 500 mg in sodium chloride 0.9 % 100 mL IVPB  Status:  Discontinued     500 mg 200 mL/hr over 30 Minutes Intravenous Every 6 hours 01/30/16 1840 01/31/16 0957   01/30/16 1900  imipenem-cilastatin (PRIMAXIN) 500 mg in sodium chloride 0.9 % 100 mL IVPB     500 mg 200 mL/hr over 30 Minutes Intravenous STAT 01/30/16 1840 01/30/16 1956   01/29/16 2200  metroNIDAZOLE (FLAGYL) tablet 500 mg  Status:  Discontinued     500 mg Oral Every 8 hours 01/29/16 1700 01/30/16 1829   01/29/16 2000  ciprofloxacin (CIPRO) tablet 500 mg  Status:  Discontinued     500 mg Oral 2 times daily 01/29/16 1700 01/30/16 1829   01/22/16 0900  piperacillin-tazobactam (ZOSYN) IVPB 3.375 g   Status:  Discontinued     3.375 g 12.5 mL/hr over 240 Minutes Intravenous Every 8 hours 01/22/16 0840 01/29/16 1700   01/21/16 1830  ciprofloxacin (CIPRO) IVPB 400 mg  Status:  Discontinued     400 mg 200 mL/hr over 60 Minutes Intravenous Every 12 hours 01/21/16 1744 01/22/16 0840   01/21/16 1830  metroNIDAZOLE (FLAGYL) IVPB 500 mg  Status:  Discontinued     500 mg 100 mL/hr over 60 Minutes Intravenous Every 6 hours 01/21/16 1744 01/22/16 0840      Assessment/Plan: Problem List: Patient Active Problem List   Diagnosis Date Noted  . Generalized anxiety disorder 02/14/2016  . 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 Hypokalemia   . Fever   . Other depression due to general medical condition   . Difficulty in walking, not elsewhere classified   . Acute blood loss anemia   . Absolute anemia   . Left upper quadrant pain   . Abdominal pain 01/07/2016  . GI bleed 01/07/2016  . IBS (irritable bowel syndrome)   . [place]   . Ulcerative pancolitis with rectal bleeding ([place])   . Diarrhea 12/24/2015  . Crohn disease ([place]) 12/24/2015  . Ulcerative pancolitis with abscess ([place])   . Rectal pain   . Leucocytosis   . Cancer of lower-inner quadrant of left female breast ([place]) 11/30/2014  . Perirectal abscess 09/29/2013  . Depression 08/12/2006    Will check two view of the abdomen.   4 Days Post-Op    LOS: 38 days   Matt B. Hassell Done, MD, [facility] [phone] beeper [phone]  02/15/2016 11:20 AM

## 2016-02-16 ENCOUNTER — Inpatient Hospital Stay (HOSPITAL_COMMUNITY): Payer: Commercial Managed Care - HMO

## 2016-02-16 DIAGNOSIS — K567 Ileus, unspecified: Secondary | ICD-10-CM

## 2016-02-16 LAB — CBC
HEMATOCRIT: 38 % (ref 36.0–46.0)
Hemoglobin: 12 g/dL (ref 12.0–15.0)
MCH: 29.2 pg (ref 26.0–34.0)
MCHC: 31.6 g/dL (ref 30.0–36.0)
MCV: 92.5 fL (ref 78.0–100.0)
Platelets: 383 10*3/uL (ref 150–400)
RBC: 4.11 MIL/uL (ref 3.87–5.11)
RDW: 15.7 % — AB (ref 11.5–15.5)
WBC: 16 10*3/uL — AB (ref 4.0–10.5)

## 2016-02-16 LAB — BASIC METABOLIC PANEL
ANION GAP: 10 (ref 5–15)
BUN: 10 mg/dL (ref 6–20)
CALCIUM: 9 mg/dL (ref 8.9–10.3)
CO2: 26 mmol/L (ref 22–32)
Chloride: 100 mmol/L — ABNORMAL LOW (ref 101–111)
Creatinine, Ser: 0.68 mg/dL (ref 0.44–1.00)
GFR calc Af Amer: >60 mL/min (ref >60)
Glucose, Bld: 88 mg/dL (ref 65–99)
POTASSIUM: 4.2 mmol/L (ref 3.5–5.1)
SODIUM: 136 mmol/L (ref 135–145)

## 2016-02-16 LAB — MAGNESIUM: MAGNESIUM: 2.2 mg/dL (ref 1.7–2.4)

## 2016-02-16 MED ORDER — PREDNISONE 20 MG PO TABS
20.0000 mg | ORAL_TABLET | Freq: Every day | ORAL | Status: DC
  Administered 2016-02-16 – 2016-02-22 (×7): 20 mg via ORAL
  Filled 2016-02-16 (×7): qty 1

## 2016-02-16 MED ORDER — POTASSIUM CHLORIDE 10 MEQ/100ML IV SOLN
10.0000 meq | Freq: Once | INTRAVENOUS | Status: AC
  Administered 2016-02-16: 10 meq via INTRAVENOUS
  Filled 2016-02-16: qty 100

## 2016-02-16 MED ORDER — POTASSIUM CHLORIDE 10 MEQ/100ML IV SOLN
10.0000 meq | INTRAVENOUS | Status: AC
  Administered 2016-02-16: 10 meq via INTRAVENOUS
  Filled 2016-02-16: qty 100

## 2016-02-16 NOTE — Progress Notes (Signed)
S: Better night, but was "drugged up all night"  Vitals, labs, intake/output, and orders reviewed at this time. Leukocytosis. Diffuse distention on plain films   Gen: A&Ox3, no distress  H&N: EOMI, atraumatic, neck supple Chest: unlabored respirations, RRR Abd: soft, mildly tender, distended, incisions c/d/i, ostomy pink, sweat in bag Ext: warm, no edema Neuro: grossly normal  Lines/tubes/drains: PIV  A/P:  Ileus s/p diverting ileostomy. If any complaint of pain or nausea today, would recommend go ahead and place NG tube. I've made her NPO.    [name], MD [facility], [place] Pager [phone]

## 2016-02-16 NOTE — Progress Notes (Signed)
Progress Note   Subjective  Patient feels sleepy this AM but she thinks her pain is slightly improved. Not much output from ostomy, not much gas compared to previous.    Objective   Vital signs in last 24 hours: Temp:  [97.7 F (36.5 C)-98.5 F (36.9 C)] 97.7 F (36.5 C) (10/21 0459) Pulse Rate:  [84-87] 87 (10/21 0459) Resp:  [18-20] 20 (10/21 0459) BP: (105-144)/(62-84) 144/84 (10/21 0459) SpO2:  [97 %-99 %] 98 % (10/21 0459) Last BM Date: 02/15/16 General:    white female in NAD Heart:  Regular rate and rhythm; no murmurs Lungs: Respirations even and unlabored, lungs CTA bilaterally Abdomen:  Soft, mildly tender to palpation, mildly distended. (+) bowel sounds Extremities:  Without edema. Neurologic:  Alert and oriented,  grossly normal neurologically. Psych:  Cooperative. Normal mood and affect.  Intake/Output from previous day: 10/20 0701 - 10/21 0700 In: 1158.3 [P.O.:250; I.V.:908.3] Out: -  Intake/Output this shift: No intake/output data recorded.  Lab Results:  Recent Labs  02/15/16 0550 02/16/16 0554  WBC 17.6* 16.0*  HGB 12.7 12.0  HCT 38.9 38.0  PLT 383 383   BMET  Recent Labs  02/15/16 0550 02/16/16 0554  NA 134* 136  K 3.8 4.2  CL 97* 100*  CO2 28 26  GLUCOSE 90 88  BUN 7 10  CREATININE 0.79 0.68  CALCIUM 9.2 9.0   LFT No results for input(s): PROT, ALBUMIN, AST, ALT, ALKPHOS, BILITOT, BILIDIR, IBILI in the last 72 hours. PT/INR No results for input(s): LABPROT, INR in the last 72 hours.  Studies/Results: Dg Abd 2 Views  Result Date: 02/16/2016 CLINICAL DATA:  75 year old female with ulcerative colitis/Crohn's disease. Nausea. Subsequent encounter. EXAM: ABDOMEN - 2 VIEW COMPARISON:  02/15/2016 FINDINGS: Colostomy right lower quadrant. Gas distended small bowel and stomach. Small bowel loops measure up to 3.7 cm without significant change. Slight fold thickening. Differential air-fluid levels. Findings suggestive of small bowel  obstruction. No obvious free intraperitoneal air however, diaphragm not included on present exam and therefore small amount of free air cannot be excluded on the current exam. IMPRESSION: Small bowel obstructive pattern similar to the prior exam. No obvious free intraperitoneal air however, diaphragm not included on present exam and therefore small amount of free air cannot be excluded on the current exam. Electronically Signed   By: Genia Del M.D.   On: 02/16/2016 08:53   Dg Abd 2 Views  Result Date: 02/15/2016 CLINICAL DATA:  Nausea and vomiting last night after laparoscopic end ileostomy on 02/11/2016 for Crohn's colitis EXAM: ABDOMEN - 2 VIEW COMPARISON:  CT scan 01/21/2016. FINDINGS: Upright film shows no evidence for intraperitoneal free air. Gaseous dilation of numerous small bowel loops noted with stair stepping air-fluid levels. Small bowel loop in the upper left abdomen is dilated up to 3.9 cm diameter. Supine film shows diffuse gaseous small bowel dilatation with minimal amount of colonic gas evident. Stoma noted right lower quadrant of the abdomen. IMPRESSION: 1. Diffuse gaseous small bowel dilatation compatible with adynamic or inflammatory ileus versus evolving distal small bowel obstruction. 2. No intraperitoneal free air. Electronically Signed   By: Misty Stanley M.D.   On: 02/15/2016 12:32       Assessment / Plan:   75 y/o female with colitis complicated by perirectal abscess / fistula, suggestive of Crohns disease, who has had a prolonged hospitalization. She did not go into remission with IV steroids, placed on Remicade, developed perirectal abscess leading to  surgery. Ultimately got diverting ileostomy and she had been recovering well, course now complicated by ileus / SBO at the ostomy for the past 2 days. Xrays not significantly changed today. WBC slightly improved. We have discussed her course at length with her and long term plan. She is trying to be optimistic, but need to  make her NPO given findings and may need NG today if no improvement. Will continue to taper prednisone, now to 33m. Please call with questions.  SCarolina Cellar MD LThe [facility] Pager [phone]

## 2016-02-16 NOTE — Progress Notes (Signed)
PROGRESS NOTE    Sara Barnes  SWF:[id number] DOB: 1940/09/11 DOA: 01/07/2016 PCP: Ann Held, DO    Brief Narrative:  Sara Barnes a 75 y.o.femalewith medical history significant of ulcerative colitis versus Crohn's disease, believed to be UC eventually,otherwise healthy, presents to the emergency room with chief complaint of severe abdominal pain for the past several days. She was recently hospitalized and discharged home about 9 days prior to admission. She was here in the hospital with similar complaints, placed on steroids, and tells me today that she improved some and really wanted to go home. At home, she has had ongoing symptoms, never really well achieved full remission, and over the last few days she has felt extremely unwell. She has crampy abdominal pain, nausea, poor appetite, barely able to keep down some Gatorade, and has been having significant amounts of bloody bowel movements as well. She has no fever or chills. She has no chest pain or shortness of breath. She denies any palpitations. She has no lightheadedness or dizziness. She feels generalized weakness. In the emergency room her vital signs are stable, her blood work is essentially unremarkable. Gastroenterology was consulted by EDP and have evaluated patient, and TRH was asked for admission.   Interim history Gastroenterology consultation evaluated the patient. She underwent flexible sigmoidoscopy. CMV was negative and she was started on Remicade. She was initially doing better and tolerating some diet and since still having a lot of pain and frequent Bms she was dosed with another Remicaide infusion on 9/17. On 9/21 pain and bloody clots worsened overnight. CT abd/pelvis showed perirectal abscess- General surgery was consulted. Patient underwent I&D on 9/26. Steroids were decreased per GI recommendations. Patient received dose of amikacin on 01/28/2016. Clinically continuing to improve. Patient is working with  physical therapy  UC vs crohn's flare, perirectal abscess s/p drain, S/p remicade, Currently on IV Solu-Medrol, cipro and tinidazole.  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 Started on clear liquid diet 10/17.    Assessment & Plan:   Principal Problem:   Ulcerative pancolitis with abscess [facility]) Active Problems:   Perirectal abscess   Rectal pain   Ulcerative pancolitis with rectal bleeding (H[facility])   IBS (irritable bowel syndrome)   Abdominal pain   GI bleed   Left upper quadrant pain   Absolute anemia   Acute blood loss anemia   Difficulty in walking, not elsewhere classified   Other depression due to general medical condition   Fever   Hypokalemia   Ileostomy in place    Generalized anxiety disorder  #1 ulcerative pancolitis with rectal bleeding/perianal abscess Patient status post flexible sigmoidoscopy 01/08/2016 with biopsies and findings consistent with active erosive ulcer. CMV was negative. Patient was given Remicade 01/09/2016, 01/13/2016, 01/28/2016. Patient status post diverting end ileostomy 02/11/2016. Patient with emesis and significant pain. Colostomy without air noted. Abdominal films ordered per general surgery. Patient currently on oral ciprofloxacin and tinidazole as well as oral prednisone taper which will need to be tapered over several weeks per GI. NSL IVF. Penrose drain discontinued. Per GI and general surgery.  #2 acute blood loss anemia Status post 4 units packed red blood cells. H&H stable.  #3 hypokalemia Repleted.  #4 pain management Continue oral MS Contin twice a day for long-acting regimen. Discontinued Vicodin. Placed on MSIR 15-69m every 4 hours as needed when necessary breakthrough pain. IV morphine when necessary severe pain breakthrough. Patient has been on a stool softener.  #5  generalized anxiety disorder Ativan as needed.  #6 ileus/small bowel obstruction at ostomy  patient with no significant  stool nor gas noted in ostomy pouch. Plain films which were obtained were consistent with ileus versus small bowel obstruction. General surgery and GI following. Patient currently nothing by mouth. Per general surgery if patient with worsening abdominal pain or nausea will recommend placement of NG tube.    DVT prophylaxis: Heparin Code Status: Full Family Communication: Updated patient and sister at bedside. Disposition Plan: skilled nursing facility.   Consultants:   Gen. surgery Dr. Harlow Asa 01/22/2016  Gastroenterology Dr. Havery Moros 01/07/2016  Procedures:   CT pelvis 01/31/2016  CT abdomen and pelvis 01/21/2016  Chest x-ray 01/30/2016  Acute abdominal series 01/07/2016  Laparoscopic and ileostomy are Dr. Hassell Done 02/11/2016  Flexible sigmoidoscopy 01/08/2016 Dr. Havery Moros  Incision, drainage and packing of complex perirectal abscess per Dr. Harlow Asa 01/22/2016  Status post 4 units packed red blood cells 9/25-26  Abdominal x-ray 02/15/2016  Antimicrobials:   IV ciprofloxacin 01/21/2016>>>>> 01/22/2016  Oral ciprofloxacin 02/02/2016  IV ciprofloxacin 01/29/2016>>>> 01/30/2016  IV Primaxin 01/30/2016>>>>> 01/31/2016  IV Flagyl 01/21/2016>>.. 01/22/2016  IV Flagyl 01/29/2016>>>>> 02/11/2016  IV Zosyn 01/22/2016>>>> 01/29/2016  IV Zosyn 02/11/2016>>>> 02/11/2016  Oral tinidazole 02/11/2016                 Subjective: Patient states some improvement with abdominal pain. Not mentioned gas in colostomy. Patient denies any chest pain. No shortness of breath.  Objective: Vitals:   02/15/16 0430 02/15/16 1412 02/15/16 2232 02/16/16 0459  BP: 121/87 105/62 138/79 (!) 144/84  Pulse: (!) 116 84  87  Resp: 18 18 18 20   Temp: 97.7 F (36.5 C) 98.5 F (36.9 C) 98 F (36.7 C) 97.7 F (36.5 C)  TempSrc: Oral Oral Oral Oral  SpO2: 97% 97% 99% 98%  Weight:      Height:        Intake/Output Summary (Last 24 hours) at 02/16/16 1230 Last data filed at 02/16/16  0300  Gross per 24 hour  Intake          1158.33 ml  Output                0 ml  Net          1158.33 ml   Filed Weights   01/07/16 1136 02/08/16 1416  Weight: 72.6 kg (160 lb) 67.3 kg (148 lb 4.8 oz)    Examination:  General exam: Appears calm and comfortable.  Respiratory system: Clear to auscultation. Respiratory effort normal. Cardiovascular system: S1 & S2 heard, RRR. No JVD, murmurs, rubs, gallops or clicks. No pedal edema. Gastrointestinal system: Abdomen is nondistended, soft and some tenderness to palpation. Colostomy bag intact with no gas noted, liquid stool noted. No organomegaly or masses felt. Normal bowel sounds heard. Central nervous system: Alert and oriented. No focal neurological deficits. Extremities: Symmetric 5 x 5 power. Skin: No rashes, lesions or ulcers Psychiatry: Judgement and insight appear normal. Mood & affect appropriate.     Data Reviewed: I have personally reviewed following labs and imaging studies  CBC:  Recent Labs Lab 02/11/16 1336 02/12/16 0517 02/13/16 0531 02/15/16 0550 02/16/16 0554  WBC 12.9* 7.8 7.6 17.6* 16.0*  HGB 10.6* 9.6* 9.8* 12.7 12.0  HCT 32.3* 29.8* 30.4* 38.9 38.0  MCV 90.5 92.0 93.3 89.6 92.5  PLT 269 274 288 383 371   Basic Metabolic Panel:  Recent Labs Lab 02/10/16 0923 02/11/16 1336 02/12/16 0517 02/13/16 0531 02/15/16 0550 02/16/16  0554  NA 134* 135 135 131* 134* 136  K 4.1 4.3 4.3 4.2 3.8 4.2  CL 97* 98* 102 98* 97* 100*  CO2 29 29 28 27 28 26   GLUCOSE 85 115* 101* 88 90 88  BUN 7 7 6 7 7 10   CREATININE 0.65 0.67 0.57 0.55 0.79 0.68  CALCIUM 8.8* 8.3* 8.4* 8.4* 9.2 9.0  MG 2.1  --   --   --   --  2.2   GFR: Estimated Creatinine Clearance: 57.8 mL/min (by C-G formula based on SCr of 0.68 mg/dL). Liver Function Tests:  Recent Labs Lab 02/12/16 0517  AST 13*  ALT 14  ALKPHOS 57  BILITOT 0.5  PROT 5.2*  ALBUMIN 2.5*   No results for input(s): LIPASE, AMYLASE in the last 168 hours. No  results for input(s): AMMONIA in the last 168 hours. Coagulation Profile: No results for input(s): INR, PROTIME in the last 168 hours. Cardiac Enzymes: No results for input(s): CKTOTAL, CKMB, CKMBINDEX, TROPONINI in the last 168 hours. BNP (last 3 results) No results for input(s): PROBNP in the last 8760 hours. HbA1C: No results for input(s): HGBA1C in the last 72 hours. CBG:  Recent Labs Lab 02/11/16 1437 02/11/16 1625 02/11/16 2045 02/12/16 0740 02/12/16 1136  GLUCAP 115* 130* 131* 105* 99   Lipid Profile: No results for input(s): CHOL, HDL, LDLCALC, TRIG, CHOLHDL, LDLDIRECT in the last 72 hours. Thyroid Function Tests: No results for input(s): TSH, T4TOTAL, FREET4, T3FREE, THYROIDAB in the last 72 hours. Anemia Panel: No results for input(s): VITAMINB12, FOLATE, FERRITIN, TIBC, IRON, RETICCTPCT in the last 72 hours. Sepsis Labs: No results for input(s): PROCALCITON, LATICACIDVEN in the last 168 hours.  No results found for this or any previous visit (from the past 240 hour(s)).       Radiology Studies: Dg Abd 2 Views  Result Date: 02/16/2016 CLINICAL DATA:  75 year old female with ulcerative colitis/Crohn's disease. Nausea. Subsequent encounter. EXAM: ABDOMEN - 2 VIEW COMPARISON:  02/15/2016 FINDINGS: Colostomy right lower quadrant. Gas distended small bowel and stomach. Small bowel loops measure up to 3.7 cm without significant change. Slight fold thickening. Differential air-fluid levels. Findings suggestive of small bowel obstruction. No obvious free intraperitoneal air however, diaphragm not included on present exam and therefore small amount of free air cannot be excluded on the current exam. IMPRESSION: Small bowel obstructive pattern similar to the prior exam. No obvious free intraperitoneal air however, diaphragm not included on present exam and therefore small amount of free air cannot be excluded on the current exam. Electronically Signed   By: Genia Del M.D.    On: 02/16/2016 08:53   Dg Abd 2 Views  Result Date: 02/15/2016 CLINICAL DATA:  Nausea and vomiting last night after laparoscopic end ileostomy on 02/11/2016 for Crohn's colitis EXAM: ABDOMEN - 2 VIEW COMPARISON:  CT scan 01/21/2016. FINDINGS: Upright film shows no evidence for intraperitoneal free air. Gaseous dilation of numerous small bowel loops noted with stair stepping air-fluid levels. Small bowel loop in the upper left abdomen is dilated up to 3.9 cm diameter. Supine film shows diffuse gaseous small bowel dilatation with minimal amount of colonic gas evident. Stoma noted right lower quadrant of the abdomen. IMPRESSION: 1. Diffuse gaseous small bowel dilatation compatible with adynamic or inflammatory ileus versus evolving distal small bowel obstruction. 2. No intraperitoneal free air. Electronically Signed   By: Misty Stanley M.D.   On: 02/15/2016 12:32        Scheduled Meds: . ciprofloxacin  500 mg Oral BID  . dicyclomine  10 mg Oral TID AC & HS  . famotidine  20 mg Oral BID  . feeding supplement (ENSURE ENLIVE)  237 mL Oral Q24H  . fluconazole  200 mg Oral Once  . FLUoxetine  40 mg Oral Daily  . heparin  5,000 Units Subcutaneous Q8H  . hydrocortisone  25 mg Rectal Daily  . mesalamine  1,000 mg Rectal QHS  . midazolam  2 mg Intravenous Once  . morphine  30 mg Oral Q12H  . potassium chloride  10 mEq Intravenous Q1 Hr x 2  . predniSONE  20 mg Oral QAC breakfast  . senna-docusate  2 tablet Oral BID  . tinidazole  500 mg Oral BID   Continuous Infusions: . sodium chloride 100 mL/hr at 02/15/16 1755     LOS: 39 days    Time spent: [place], MD [facility] Pager [phone]  If 7PM-7AM, please contact night-coverage [email or web address] Password [email or web address] 02/16/2016, 12:30 PM

## 2016-02-17 ENCOUNTER — Inpatient Hospital Stay (HOSPITAL_COMMUNITY): Payer: Commercial Managed Care - HMO

## 2016-02-17 LAB — BASIC METABOLIC PANEL
Anion gap: 8 (ref 5–15)
BUN: 8 mg/dL (ref 6–20)
CALCIUM: 8.6 mg/dL — AB (ref 8.9–10.3)
CO2: 25 mmol/L (ref 22–32)
Chloride: 102 mmol/L (ref 101–111)
Creatinine, Ser: 0.72 mg/dL (ref 0.44–1.00)
GFR calc Af Amer: >60 mL/min (ref >60)
GLUCOSE: 77 mg/dL (ref 65–99)
POTASSIUM: 4 mmol/L (ref 3.5–5.1)
Sodium: 135 mmol/L (ref 135–145)

## 2016-02-17 LAB — CBC WITH DIFFERENTIAL/PLATELET
BASOS ABS: 0 10*3/uL (ref 0.0–0.1)
Basophils Relative: 0 %
EOS PCT: 0 %
Eosinophils Absolute: 0 10*3/uL (ref 0.0–0.7)
HEMATOCRIT: 29.1 % — AB (ref 36.0–46.0)
Hemoglobin: 9.4 g/dL — ABNORMAL LOW (ref 12.0–15.0)
LYMPHS PCT: 26 %
Lymphs Abs: 2.5 10*3/uL (ref 0.7–4.0)
MCH: 30.2 pg (ref 26.0–34.0)
MCHC: 32.3 g/dL (ref 30.0–36.0)
MCV: 93.6 fL (ref 78.0–100.0)
MONO ABS: 0.9 10*3/uL (ref 0.1–1.0)
MONOS PCT: 10 %
Neutro Abs: 6.2 10*3/uL (ref 1.7–7.7)
Neutrophils Relative %: 65 %
PLATELETS: 248 10*3/uL (ref 150–400)
RBC: 3.11 MIL/uL — ABNORMAL LOW (ref 3.87–5.11)
RDW: 15.7 % — AB (ref 11.5–15.5)
WBC: 9.6 10*3/uL (ref 4.0–10.5)

## 2016-02-17 LAB — GLUCOSE, CAPILLARY: Glucose-Capillary: 87 mg/dL (ref 65–99)

## 2016-02-17 LAB — MAGNESIUM: Magnesium: 1.9 mg/dL (ref 1.7–2.4)

## 2016-02-17 MED ORDER — DEXTROSE-NACL 5-0.9 % IV SOLN
INTRAVENOUS | Status: AC
  Administered 2016-02-17 – 2016-02-23 (×9): via INTRAVENOUS

## 2016-02-17 NOTE — Progress Notes (Signed)
Progress Note   Subjective  Patient states she is feeling better today after being NPO. Has been ambulating, feels less distended. She had several questions about long term plan.    Objective   Vital signs in last 24 hours: Temp:  [97.7 F (36.5 C)-98.8 F (37.1 C)] 97.7 F (36.5 C) (10/22 0542) Pulse Rate:  [75-94] 75 (10/22 0542) Resp:  [18] 18 (10/22 0542) BP: (135-143)/(66-93) 136/66 (10/22 0542) SpO2:  [97 %] 97 % (10/22 0542) Last BM Date: 02/16/16 General:    white female in NAD Heart:  Regular rate and rhythm; no murmurs Lungs: Respirations even and unlabored, lungs CTA bilaterally Abdomen:  Soft, ostomy in RLQ with liquid stool / gas, mild TTP mid abdomen, mildly distended.  Extremities:  Without edema. Neurologic:  Alert and oriented,  grossly normal neurologically. Psych:  Cooperative. Normal mood and affect.  Intake/Output from previous day: 10/21 0701 - 10/22 0700 In: 2400 [I.V.:2400] Out: -  Intake/Output this shift: No intake/output data recorded.  Lab Results:  Recent Labs  02/15/16 0550 02/16/16 0554 02/17/16 0653  WBC 17.6* 16.0* 9.6  HGB 12.7 12.0 9.4*  HCT 38.9 38.0 29.1*  PLT 383 383 248   BMET  Recent Labs  02/15/16 0550 02/16/16 0554 02/17/16 0653  NA 134* 136 135  K 3.8 4.2 4.0  CL 97* 100* 102  CO2 28 26 25   GLUCOSE 90 88 77  BUN 7 10 8   CREATININE 0.79 0.68 0.72  CALCIUM 9.2 9.0 8.6*   LFT No results for input(s): PROT, ALBUMIN, AST, ALT, ALKPHOS, BILITOT, BILIDIR, IBILI in the last 72 hours. PT/INR No results for input(s): LABPROT, INR in the last 72 hours.  Studies/Results: Dg Abd 2 Views  Result Date: 02/17/2016 CLINICAL DATA:  Crohn's disease, status post diverting ileostomy 4 days ago, ileus EXAM: ABDOMEN - 2 VIEW COMPARISON:  02/16/2016 FINDINGS: Right lower quadrant ostomy noted. Similar diffuse gas distention of the small bowel throughout the abdomen and pelvis. Small bowel diameter 3.2 cm, previously 3.7  cm. Diffuse air-fluid levels noted on the upright exam. Appearance compatible with distal small bowel obstruction versus severe ileus. No free air evident. Lung bases clear. Stable cardiomegaly. Degenerative changes of the spine. No abnormal calcifications. IMPRESSION: Similar pattern of diffuse small bowel gaseous distension compatible with distal small bowel obstruction versus severe ileus. No free air evident by plain radiography Electronically Signed   By: Jerilynn Mages.  Shick M.D.   On: 02/17/2016 10:12   Dg Abd 2 Views  Result Date: 02/16/2016 CLINICAL DATA:  75 year old female with ulcerative colitis/Crohn's disease. Nausea. Subsequent encounter. EXAM: ABDOMEN - 2 VIEW COMPARISON:  02/15/2016 FINDINGS: Colostomy right lower quadrant. Gas distended small bowel and stomach. Small bowel loops measure up to 3.7 cm without significant change. Slight fold thickening. Differential air-fluid levels. Findings suggestive of small bowel obstruction. No obvious free intraperitoneal air however, diaphragm not included on present exam and therefore small amount of free air cannot be excluded on the current exam. IMPRESSION: Small bowel obstructive pattern similar to the prior exam. No obvious free intraperitoneal air however, diaphragm not included on present exam and therefore small amount of free air cannot be excluded on the current exam. Electronically Signed   By: Genia Del M.D.   On: 02/16/2016 08:53   Dg Abd 2 Views  Result Date: 02/15/2016 CLINICAL DATA:  Nausea and vomiting last night after laparoscopic end ileostomy on 02/11/2016 for Crohn's colitis EXAM: ABDOMEN - 2 VIEW COMPARISON:  CT scan 01/21/2016. FINDINGS: Upright film shows no evidence for intraperitoneal free air. Gaseous dilation of numerous small bowel loops noted with stair stepping air-fluid levels. Small bowel loop in the upper left abdomen is dilated up to 3.9 cm diameter. Supine film shows diffuse gaseous small bowel dilatation with minimal  amount of colonic gas evident. Stoma noted right lower quadrant of the abdomen. IMPRESSION: 1. Diffuse gaseous small bowel dilatation compatible with adynamic or inflammatory ileus versus evolving distal small bowel obstruction. 2. No intraperitoneal free air. Electronically Signed   By: Misty Stanley M.D.   On: 02/15/2016 12:32       Assessment / Plan:   75 y/o female with colitis complicated by perirectal abscess / fistula, suggestive of Crohns disease, who has had a prolonged hospitalization. She did not go into remission with IV steroids, placed on Remicade, developed perirectal abscess leading to surgery. Ultimately got diverting ileostomy and she had been recovering well, course most recently complicated by ileus / SBO at the ostomy for the past 3 days. Xrays not significantly changed today, although SB distension is slightly less. WBC improved although all cell lines down, suspect diluted specimen. She's had no symptoms of bleeding.   I spent several minutes this morning discussing her course at length with her and long term plan. She is trying to be optimistic, but need keep her NPO given findings and may need NG today, although she reports feeling improved. Surgery managing ostomy. Will continue to taper prednisone, now to 29m. Long term will discuss medical management with Dr. PHilarie Fredricksonin regards to further Remicade, for which she will be due in about a week if this is continued - if thought to be Crohns, this should be continued given her perianal disease, although given she is now diverted her perianal disease will may heal on its own. There was no evidence of ileal involvement pre-operatively.   Dr. DLoletha Carrowwill be assuming her care tomorrow. Please call with questions.  SCarolina Cellar MD [facility] Pager [phone]

## 2016-02-17 NOTE — Progress Notes (Signed)
S: Feels much better. One brief wave of nausea yesterday but no emesis, pain improved.   Vitals, labs, intake/output, and orders reviewed at this time.  Gen: A&Ox3, no distress  H&N: EOMI, atraumatic, neck supple Chest: unlabored respirations, RRR Abd: soft, mildly diffusely tender, distended, incisions c/d/i, RLQ ostomy pink and viable with bowel sweat and small amount of gas in the bag.  Perineum: Improving appearance of penrose sites, minimal purulent drainage. Stable skin irritation.  Ext: warm, no edema Neuro: grossly normal  Lines/tubes/drains: PIV  A/P:  Complicated course- crohns s/p I&D of horseshoe abscess with ongoing drainage of stool through wounds. Now s/p diverting ileostomy c/b ileus.  -Keep NPO today. Again if she develops ongoing nausea or pain would place NGT. Some gas in the bag which is encouraging.  -Continue IVF, may need TPN early this week if ongoing ileus -OOB, Ambulate, Sitz baths   [name], MD [facility], [place] Pager [phone]

## 2016-02-17 NOTE — Progress Notes (Signed)
PROGRESS NOTE    [name]  HQP:[id number] DOB: 03-23-1941 DOA: 01/07/2016 PCP: Ann Held, DO    Brief Narrative:  Sara Barnes a 75 y.o.femalewith medical history significant of ulcerative colitis versus Crohn's disease, believed to be UC eventually,otherwise healthy, presents to the emergency room with chief complaint of severe abdominal pain for the past several days. She was recently hospitalized and discharged home about 9 days prior to admission. She was here in the hospital with similar complaints, placed on steroids, and tells me today that she improved some and really wanted to go home. At home, she has had ongoing symptoms, never really well achieved full remission, and over the last few days she has felt extremely unwell. She has crampy abdominal pain, nausea, poor appetite, barely able to keep down some Gatorade, and has been having significant amounts of bloody bowel movements as well. She has no fever or chills. She has no chest pain or shortness of breath. She denies any palpitations. She has no lightheadedness or dizziness. She feels generalized weakness. In the emergency room her vital signs are stable, her blood work is essentially unremarkable. Gastroenterology was consulted by EDP and have evaluated patient, and [facility] was asked for admission.   Interim history Gastroenterology consultation evaluated the patient. She underwent flexible sigmoidoscopy. CMV was negative and she was started on Remicade. She was initially doing better and tolerating some diet and since still having a lot of pain and frequent Bms she was dosed with another Remicaide infusion on 9/17. On 9/21 pain and bloody clots worsened overnight. CT abd/pelvis showed perirectal abscess- General surgery was consulted. Patient underwent I&D on 9/26. Steroids were decreased per GI recommendations. Patient received dose of amikacin on 01/28/2016. Clinically continuing to improve. Patient is working with  physical therapy  UC vs crohn's flare, perirectal abscess s/p drain, S/p remicade, Currently on IV Solu-Medrol, cipro and tinidazole.  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 Started on clear liquid diet 10/17.    Assessment & Plan:   Principal Problem:   Ulcerative pancolitis with abscess [facility]) Active Problems:   Perirectal abscess   Rectal pain   Ulcerative pancolitis with rectal bleeding (HCC)   IBS (irritable bowel syndrome)   Abdominal pain   GI bleed   Left upper quadrant pain   Absolute anemia   Acute blood loss anemia   Difficulty in walking, not elsewhere classified   Other depression due to general medical condition   Fever   Hypokalemia   Ileostomy in place    Generalized anxiety disorder   Ileus ([place])  #1 ulcerative pancolitis with rectal bleeding/perianal abscess Patient status post flexible sigmoidoscopy 01/08/2016 with biopsies and findings consistent with active erosive ulcer. CMV was negative. Patient was given Remicade 01/09/2016, 01/13/2016, 01/28/2016. Patient status post diverting end ileostomy 02/11/2016. Patient with emesis and significant pain. Colostomy without air noted. Abdominal films ordered per general surgery. Patient currently on oral ciprofloxacin and tinidazole as well as oral prednisone taper which will need to be tapered over several weeks per GI. NSL IVF. Penrose drain discontinued. Per GI and general surgery.  #2 acute blood loss anemia Status post 4 units packed red blood cells. H&H stable.  #3 hypokalemia Repleted.  #4 pain management Continue oral MS Contin twice a day for long-acting regimen. Discontinued Vicodin. Placed on MSIR 15-65m every 4 hours as needed when necessary breakthrough pain. IV morphine when necessary severe pain breakthrough. Patient has been on a  stool softener.  #5 generalized anxiety disorder Ativan as needed.  #6 ileus/small bowel obstruction at ostomy  patient with  no significant stool nor gas noted in ostomy pouch. Plain films which were obtained were consistent with ileus versus small bowel obstruction. General surgery and GI following. Patient currently nothing by mouth. Per general surgery if patient with worsening abdominal pain or nausea will recommend placement of NG tube.    DVT prophylaxis: Heparin Code Status: Full Family Communication: Updated patient and sister at bedside. Disposition Plan: skilled nursing facility.   Consultants:   Gen. surgery Dr. Harlow Asa 01/22/2016  Gastroenterology Dr. Havery Moros 01/07/2016  Procedures:   CT pelvis 01/31/2016  CT abdomen and pelvis 01/21/2016  Chest x-ray 01/30/2016  Acute abdominal series 01/07/2016  Laparoscopic and ileostomy are Dr. Hassell Done 02/11/2016  Flexible sigmoidoscopy 01/08/2016 Dr. Havery Moros  Incision, drainage and packing of complex perirectal abscess per Dr. Harlow Asa 01/22/2016  Status post 4 units packed red blood cells 9/25-26  Abdominal x-ray 02/15/2016  Antimicrobials:   IV ciprofloxacin 01/21/2016>>>>> 01/22/2016  Oral ciprofloxacin 02/02/2016  IV ciprofloxacin 01/29/2016>>>> 01/30/2016  IV Primaxin 01/30/2016>>>>> 01/31/2016  IV Flagyl 01/21/2016>>.. 01/22/2016  IV Flagyl 01/29/2016>>>>> 02/11/2016  IV Zosyn 01/22/2016>>>> 01/29/2016  IV Zosyn 02/11/2016>>>> 02/11/2016  Oral tinidazole 02/11/2016                 Subjective: Patient states improvement with abdominal pain. No CP, no SOB, no nausea, no emesis. Feeling better.  Objective: Vitals:   02/16/16 1331 02/16/16 2112 02/17/16 0542 02/17/16 1357  BP: (!) 143/93 135/72 136/66 140/76  Pulse: 94 79 75 68  Resp: 18 18 18 18   Temp: 98.8 F (37.1 C) 97.8 F (36.6 C) 97.7 F (36.5 C) 98.9 F (37.2 C)  TempSrc: Oral Oral Oral Oral  SpO2: 97% 97% 97% 97%  Weight:      Height:        Intake/Output Summary (Last 24 hours) at 02/17/16 1505 Last data filed at 02/17/16 0700  Gross per 24 hour    Intake             1200 ml  Output                0 ml  Net             1200 ml   Filed Weights   01/07/16 1136 02/08/16 1416  Weight: 72.6 kg (160 lb) 67.3 kg (148 lb 4.8 oz)    Examination:  General exam: Appears calm and comfortable.  Respiratory system: Clear to auscultation. Respiratory effort normal. Cardiovascular system: S1 & S2 heard, RRR. No JVD, murmurs, rubs, gallops or clicks. No pedal edema. Gastrointestinal system: Abdomen is nondistended, soft and some tenderness to palpation. Colostomy bag intact with some gas noted, liquid stool noted. No organomegaly or masses felt. Normal bowel sounds heard. Central nervous system: Alert and oriented. No focal neurological deficits. Extremities: Symmetric 5 x 5 power. Skin: No rashes, lesions or ulcers Psychiatry: Judgement and insight appear normal. Mood & affect appropriate.     Data Reviewed: I have personally reviewed following labs and imaging studies  CBC:  Recent Labs Lab 02/12/16 0517 02/13/16 0531 02/15/16 0550 02/16/16 0554 02/17/16 0653  WBC 7.8 7.6 17.6* 16.0* 9.6  NEUTROABS  --   --   --   --  6.2  HGB 9.6* 9.8* 12.7 12.0 9.4*  HCT 29.8* 30.4* 38.9 38.0 29.1*  MCV 92.0 93.3 89.6 92.5 93.6  PLT 274 288 383 383 248  Basic Metabolic Panel:  Recent Labs Lab 02/12/16 0517 02/13/16 0531 02/15/16 0550 02/16/16 0554 02/17/16 0653  NA 135 131* 134* 136 135  K 4.3 4.2 3.8 4.2 4.0  CL 102 98* 97* 100* 102  CO2 28 27 28 26 25   GLUCOSE 101* 88 90 88 77  BUN 6 7 7 10 8   CREATININE 0.57 0.55 0.79 0.68 0.72  CALCIUM 8.4* 8.4* 9.2 9.0 8.6*  MG  --   --   --  2.2 1.9   GFR: Estimated Creatinine Clearance: 57.8 mL/min (by C-G formula based on SCr of 0.72 mg/dL). Liver Function Tests:  Recent Labs Lab 02/12/16 0517  AST 13*  ALT 14  ALKPHOS 57  BILITOT 0.5  PROT 5.2*  ALBUMIN 2.5*   No results for input(s): LIPASE, AMYLASE in the last 168 hours. No results for input(s): AMMONIA in the last 168  hours. Coagulation Profile: No results for input(s): INR, PROTIME in the last 168 hours. Cardiac Enzymes: No results for input(s): CKTOTAL, CKMB, CKMBINDEX, TROPONINI in the last 168 hours. BNP (last 3 results) No results for input(s): PROBNP in the last 8760 hours. HbA1C: No results for input(s): HGBA1C in the last 72 hours. CBG:  Recent Labs Lab 02/11/16 1625 02/11/16 2045 02/12/16 0740 02/12/16 1136 02/17/16 1357  GLUCAP 130* 131* 105* 99 87   Lipid Profile: No results for input(s): CHOL, HDL, LDLCALC, TRIG, CHOLHDL, LDLDIRECT in the last 72 hours. Thyroid Function Tests: No results for input(s): TSH, T4TOTAL, FREET4, T3FREE, THYROIDAB in the last 72 hours. Anemia Panel: No results for input(s): VITAMINB12, FOLATE, FERRITIN, TIBC, IRON, RETICCTPCT in the last 72 hours. Sepsis Labs: No results for input(s): PROCALCITON, LATICACIDVEN in the last 168 hours.  No results found for this or any previous visit (from the past 240 hour(s)).       Radiology Studies: Dg Abd 2 Views  Result Date: 02/17/2016 CLINICAL DATA:  Crohn's disease, status post diverting ileostomy 4 days ago, ileus EXAM: ABDOMEN - 2 VIEW COMPARISON:  02/16/2016 FINDINGS: Right lower quadrant ostomy noted. Similar diffuse gas distention of the small bowel throughout the abdomen and pelvis. Small bowel diameter 3.2 cm, previously 3.7 cm. Diffuse air-fluid levels noted on the upright exam. Appearance compatible with distal small bowel obstruction versus severe ileus. No free air evident. Lung bases clear. Stable cardiomegaly. Degenerative changes of the spine. No abnormal calcifications. IMPRESSION: Similar pattern of diffuse small bowel gaseous distension compatible with distal small bowel obstruction versus severe ileus. No free air evident by plain radiography Electronically Signed   By: Jerilynn Mages.  Shick M.D.   On: 02/17/2016 10:12   Dg Abd 2 Views  Result Date: 02/16/2016 CLINICAL DATA:  75 year old female with  ulcerative colitis/Crohn's disease. Nausea. Subsequent encounter. EXAM: ABDOMEN - 2 VIEW COMPARISON:  02/15/2016 FINDINGS: Colostomy right lower quadrant. Gas distended small bowel and stomach. Small bowel loops measure up to 3.7 cm without significant change. Slight fold thickening. Differential air-fluid levels. Findings suggestive of small bowel obstruction. No obvious free intraperitoneal air however, diaphragm not included on present exam and therefore small amount of free air cannot be excluded on the current exam. IMPRESSION: Small bowel obstructive pattern similar to the prior exam. No obvious free intraperitoneal air however, diaphragm not included on present exam and therefore small amount of free air cannot be excluded on the current exam. Electronically Signed   By: Genia Del M.D.   On: 02/16/2016 08:53        Scheduled Meds: . ciprofloxacin  500 mg Oral BID  . dicyclomine  10 mg Oral TID AC & HS  . famotidine  20 mg Oral BID  . feeding supplement (ENSURE ENLIVE)  237 mL Oral Q24H  . fluconazole  200 mg Oral Once  . FLUoxetine  40 mg Oral Daily  . heparin  5,000 Units Subcutaneous Q8H  . hydrocortisone  25 mg Rectal Daily  . mesalamine  1,000 mg Rectal QHS  . midazolam  2 mg Intravenous Once  . morphine  30 mg Oral Q12H  . predniSONE  20 mg Oral QAC breakfast  . senna-docusate  2 tablet Oral BID  . tinidazole  500 mg Oral BID   Continuous Infusions: . dextrose 5 % and 0.9% NaCl       LOS: 40 days    Time spent: Henry, MD [facility] Pager [phone]  If 7PM-7AM, please contact night-coverage [email or web address] Password [email or web address] 02/17/2016, 3:05 PM

## 2016-02-18 ENCOUNTER — Telehealth: Payer: Self-pay

## 2016-02-18 ENCOUNTER — Inpatient Hospital Stay (HOSPITAL_COMMUNITY): Payer: Commercial Managed Care - HMO

## 2016-02-18 DIAGNOSIS — K56609 Unspecified intestinal obstruction, unspecified as to partial versus complete obstruction: Secondary | ICD-10-CM

## 2016-02-18 LAB — CBC
HCT: 30.5 % — ABNORMAL LOW (ref 36.0–46.0)
HEMOGLOBIN: 9.6 g/dL — AB (ref 12.0–15.0)
MCH: 29.5 pg (ref 26.0–34.0)
MCHC: 31.5 g/dL (ref 30.0–36.0)
MCV: 93.8 fL (ref 78.0–100.0)
Platelets: 224 10*3/uL (ref 150–400)
RBC: 3.25 MIL/uL — AB (ref 3.87–5.11)
RDW: 15.6 % — ABNORMAL HIGH (ref 11.5–15.5)
WBC: 9.6 10*3/uL (ref 4.0–10.5)

## 2016-02-18 LAB — BASIC METABOLIC PANEL
ANION GAP: 6 (ref 5–15)
BUN: 5 mg/dL — ABNORMAL LOW (ref 6–20)
CALCIUM: 8.3 mg/dL — AB (ref 8.9–10.3)
CO2: 25 mmol/L (ref 22–32)
Chloride: 104 mmol/L (ref 101–111)
Creatinine, Ser: 0.61 mg/dL (ref 0.44–1.00)
Glucose, Bld: 83 mg/dL (ref 65–99)
Potassium: 3.5 mmol/L (ref 3.5–5.1)
SODIUM: 135 mmol/L (ref 135–145)

## 2016-02-18 LAB — MAGNESIUM: MAGNESIUM: 1.8 mg/dL (ref 1.7–2.4)

## 2016-02-18 MED ORDER — POTASSIUM CHLORIDE CRYS ER 20 MEQ PO TBCR
40.0000 meq | EXTENDED_RELEASE_TABLET | Freq: Once | ORAL | Status: AC
  Administered 2016-02-18: 40 meq via ORAL
  Filled 2016-02-18: qty 2

## 2016-02-18 NOTE — Telephone Encounter (Signed)
Dr. Hilarie Fredrickson you did not specify [facility] correct? I thought the social workers work to find placement at facilities that have openings. Please advise.

## 2016-02-18 NOTE — Progress Notes (Signed)
PROGRESS NOTE    Sara Barnes  HYQ:[id number] DOB: 1940/12/22 DOA: 01/07/2016 PCP: Ann Held, DO    Brief Narrative:  Sara Barnes a 75 y.o.femalewith medical history significant of ulcerative colitis versus Crohn's disease, believed to be UC eventually,otherwise healthy, presents to the emergency room with chief complaint of severe abdominal pain for the past several days. She was recently hospitalized and discharged home about 9 days prior to admission. She was here in the hospital with similar complaints, placed on steroids, and tells me today that she improved some and really wanted to go home. At home, she has had ongoing symptoms, never really well achieved full remission, and over the last few days she has felt extremely unwell. She has crampy abdominal pain, nausea, poor appetite, barely able to keep down some Gatorade, and has been having significant amounts of bloody bowel movements as well. She has no fever or chills. She has no chest pain or shortness of breath. She denies any palpitations. She has no lightheadedness or dizziness. She feels generalized weakness. In the emergency room her vital signs are stable, her blood work is essentially unremarkable. Gastroenterology was consulted by EDP and have evaluated patient, and TRH was asked for admission.   Interim history Gastroenterology consultation evaluated the patient. She underwent flexible sigmoidoscopy. CMV was negative and she was started on Remicade. She was initially doing better and tolerating some diet and since still having a lot of pain and frequent Bms she was dosed with another Remicaide infusion on 9/17. On 9/21 pain and bloody clots worsened overnight. CT abd/pelvis showed perirectal abscess- General surgery was consulted. Patient underwent I&D on 9/26. Steroids were decreased per GI recommendations. Patient received dose of amikacin on 01/28/2016. Clinically continuing to improve. Patient is working with  physical therapy  UC vs crohn's flare, perirectal abscess s/p drain, S/p remicade, Currently on IV Solu-Medrol, cipro and tinidazole.  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 Started on clear liquid diet 10/17.    Assessment & Plan:   Principal Problem:   Ulcerative pancolitis with abscess [facility]) Active Problems:   Perirectal abscess   Rectal pain   Ulcerative pancolitis with rectal bleeding (HCC)   IBS (irritable bowel syndrome)   Abdominal pain   GI bleed   Left upper quadrant pain   Absolute anemia   Acute blood loss anemia   Difficulty in walking, not elsewhere classified   Other depression due to general medical condition   Fever   Hypokalemia   Ileostomy in place    Generalized anxiety disorder   Ileus ([place])  #1 ulcerative pancolitis with rectal bleeding/perianal abscess Patient status post flexible sigmoidoscopy 01/08/2016 with biopsies and findings consistent with active erosive ulcer. CMV was negative. Patient was given Remicade 01/09/2016, 01/13/2016, 01/28/2016. Patient status post diverting end ileostomy 02/11/2016. Patient with emesis and significant pain. Colostomy without air noted. Abdominal films ordered per general surgery. Patient currently on oral ciprofloxacin and tinidazole as well as oral prednisone taper which will need to be tapered over several weeks per GI. NSL IVF. Penrose drain discontinued. Per GI and general surgery.  #2 acute blood loss anemia Status post 4 units packed red blood cells. H&H stable.  #3 hypokalemia Repleted.  #4 pain management Continue oral MS Contin twice a day for long-acting regimen. Discontinued Vicodin. Continue MSIR 15-72m every 4 hours as needed when necessary breakthrough pain. IV morphine when necessary severe pain breakthrough. Patient has been on a stool  softener.  #5 generalized anxiety disorder Ativan as needed.  #6 ileus/small bowel obstruction at ostomy  patient states  some increasing gas and loose stool noted in ostomy pouch. Plain films which were obtained were consistent with ileus versus small bowel obstruction. General surgery and GI following. Patient has been started on clear liquids per general surgery.  Per general surgery if patient with worsening abdominal pain or nausea will recommend placement of NG tube.    DVT prophylaxis: Heparin Code Status: Full Family Communication: Updated patient and sister at bedside. Disposition Plan: skilled nursing facility.   Consultants:   Gen. surgery Dr. Harlow Asa 01/22/2016  Gastroenterology Dr. Havery Moros 01/07/2016  Procedures:   CT pelvis 01/31/2016  CT abdomen and pelvis 01/21/2016  Chest x-ray 01/30/2016  Acute abdominal series 01/07/2016  Laparoscopic and ileostomy are Dr. Hassell Done 02/11/2016  Flexible sigmoidoscopy 01/08/2016 Dr. Havery Moros  Incision, drainage and packing of complex perirectal abscess per Dr. Harlow Asa 01/22/2016  Status post 4 units packed red blood cells 9/25-26  Abdominal x-ray 02/15/2016  Antimicrobials:   IV ciprofloxacin 01/21/2016>>>>> 01/22/2016  Oral ciprofloxacin 02/02/2016  IV ciprofloxacin 01/29/2016>>>> 01/30/2016  IV Primaxin 01/30/2016>>>>> 01/31/2016  IV Flagyl 01/21/2016>>.. 01/22/2016  IV Flagyl 01/29/2016>>>>> 02/11/2016  IV Zosyn 01/22/2016>>>> 01/29/2016  IV Zosyn 02/11/2016>>>> 02/11/2016  Oral tinidazole 02/11/2016                 Subjective: Patient states some abdominal pain however seems to be improving. Patient states feels a little more gas noted in ostomy bag. No CP, no SOB, no nausea, no emesis. Feeling better.  Objective: Vitals:   02/17/16 1357 02/17/16 2108 02/18/16 0652 02/18/16 1542  BP: 140/76 (!) 147/77 (!) 154/64 121/66  Pulse: 68 75 81 83  Resp: 18 18    Temp: 98.9 F (37.2 C) 97.8 F (36.6 C) 98.6 F (37 C) 98.6 F (37 C)  TempSrc: Oral Oral Oral Oral  SpO2: 97% 100% 98% 97%  Weight:      Height:         Intake/Output Summary (Last 24 hours) at 02/18/16 1556 Last data filed at 02/18/16 1500  Gross per 24 hour  Intake             2555 ml  Output                0 ml  Net             2555 ml   Filed Weights   01/07/16 1136 02/08/16 1416  Weight: 72.6 kg (160 lb) 67.3 kg (148 lb 4.8 oz)    Examination:  General exam: Appears calm and comfortable.  Respiratory system: Clear to auscultation. Respiratory effort normal. Cardiovascular system: S1 & S2 heard, RRR. No JVD, murmurs, rubs, gallops or clicks. No pedal edema. Gastrointestinal system: Abdomen is nondistended, soft and some tenderness to palpation. Colostomy bag intact with some gas noted, liquid stool noted. No organomegaly or masses felt. Normal bowel sounds heard. Central nervous system: Alert and oriented. No focal neurological deficits. Extremities: Symmetric 5 x 5 power. Right upper extremity with some swelling around IV site. Skin: No rashes, lesions or ulcers Psychiatry: Judgement and insight appear normal. Mood & affect appropriate.     Data Reviewed: I have personally reviewed following labs and imaging studies  CBC:  Recent Labs Lab 02/13/16 0531 02/15/16 0550 02/16/16 0554 02/17/16 0653 02/18/16 0524  WBC 7.6 17.6* 16.0* 9.6 9.6  NEUTROABS  --   --   --  6.2  --  HGB 9.8* 12.7 12.0 9.4* 9.6*  HCT 30.4* 38.9 38.0 29.1* 30.5*  MCV 93.3 89.6 92.5 93.6 93.8  PLT 288 383 383 248 295   Basic Metabolic Panel:  Recent Labs Lab 02/13/16 0531 02/15/16 0550 02/16/16 0554 02/17/16 0653 02/18/16 0524  NA 131* 134* 136 135 135  K 4.2 3.8 4.2 4.0 3.5  CL 98* 97* 100* 102 104  CO2 27 28 26 25 25   GLUCOSE 88 90 88 77 83  BUN 7 7 10 8  5*  CREATININE 0.55 0.79 0.68 0.72 0.61  CALCIUM 8.4* 9.2 9.0 8.6* 8.3*  MG  --   --  2.2 1.9 1.8   GFR: Estimated Creatinine Clearance: 57.8 mL/min (by C-G formula based on SCr of 0.61 mg/dL). Liver Function Tests:  Recent Labs Lab 02/12/16 0517  AST 13*  ALT 14   ALKPHOS 57  BILITOT 0.5  PROT 5.2*  ALBUMIN 2.5*   No results for input(s): LIPASE, AMYLASE in the last 168 hours. No results for input(s): AMMONIA in the last 168 hours. Coagulation Profile: No results for input(s): INR, PROTIME in the last 168 hours. Cardiac Enzymes: No results for input(s): CKTOTAL, CKMB, CKMBINDEX, TROPONINI in the last 168 hours. BNP (last 3 results) No results for input(s): PROBNP in the last 8760 hours. HbA1C: No results for input(s): HGBA1C in the last 72 hours. CBG:  Recent Labs Lab 02/11/16 1625 02/11/16 2045 02/12/16 0740 02/12/16 1136 02/17/16 1357  GLUCAP 130* 131* 105* 99 87   Lipid Profile: No results for input(s): CHOL, HDL, LDLCALC, TRIG, CHOLHDL, LDLDIRECT in the last 72 hours. Thyroid Function Tests: No results for input(s): TSH, T4TOTAL, FREET4, T3FREE, THYROIDAB in the last 72 hours. Anemia Panel: No results for input(s): VITAMINB12, FOLATE, FERRITIN, TIBC, IRON, RETICCTPCT in the last 72 hours. Sepsis Labs: No results for input(s): PROCALCITON, LATICACIDVEN in the last 168 hours.  No results found for this or any previous visit (from the past 240 hour(s)).       Radiology Studies: Dg Abd 2 Views  Result Date: 02/18/2016 CLINICAL DATA:  Ileus. EXAM: ABDOMEN - 2 VIEW COMPARISON:  02/17/2016. FINDINGS: Soft tissue structures are unremarkable. Multiple dilated loops of small bowel are noted. These findings suggest small bowel obstruction. Ostomy noted in the right lower quadrant. No free air. IMPRESSION: Persistent multiple dilated loops of small bowel suggesting small bowel obstruction. No interim change. Ostomy noted in the right lower quadrant. Electronically Signed   By: Marcello Moores  Register   On: 02/18/2016 09:36   Dg Abd 2 Views  Result Date: 02/17/2016 CLINICAL DATA:  Crohn's disease, status post diverting ileostomy 4 days ago, ileus EXAM: ABDOMEN - 2 VIEW COMPARISON:  02/16/2016 FINDINGS: Right lower quadrant ostomy noted.  Similar diffuse gas distention of the small bowel throughout the abdomen and pelvis. Small bowel diameter 3.2 cm, previously 3.7 cm. Diffuse air-fluid levels noted on the upright exam. Appearance compatible with distal small bowel obstruction versus severe ileus. No free air evident. Lung bases clear. Stable cardiomegaly. Degenerative changes of the spine. No abnormal calcifications. IMPRESSION: Similar pattern of diffuse small bowel gaseous distension compatible with distal small bowel obstruction versus severe ileus. No free air evident by plain radiography Electronically Signed   By: Jerilynn Mages.  Shick M.D.   On: 02/17/2016 10:12        Scheduled Meds: . ciprofloxacin  500 mg Oral BID  . dicyclomine  10 mg Oral TID AC & HS  . famotidine  20 mg Oral BID  .  feeding supplement (ENSURE ENLIVE)  237 mL Oral Q24H  . fluconazole  200 mg Oral Once  . FLUoxetine  40 mg Oral Daily  . heparin  5,000 Units Subcutaneous Q8H  . hydrocortisone  25 mg Rectal Daily  . mesalamine  1,000 mg Rectal QHS  . midazolam  2 mg Intravenous Once  . morphine  30 mg Oral Q12H  . predniSONE  20 mg Oral QAC breakfast  . senna-docusate  2 tablet Oral BID  . tinidazole  500 mg Oral BID   Continuous Infusions: . dextrose 5 % and 0.9% NaCl 100 mL/hr at 02/18/16 1047     LOS: 41 days    Time spent: [place], MD [facility] Pager [phone]  If 7PM-7AM, please contact night-coverage [email or web address] Password [email or web address] 02/18/2016, 3:56 PM

## 2016-02-18 NOTE — Telephone Encounter (Signed)
Would cancel Remicade appointment for now I have discussed with Dr. Havery Moros. Given her diverting end ileostomy and the fact she is still healing from surgery will delay Remicade I do think she needs to be seen by an IBD surgeon after discharge to discuss the need for possible further surgery, specifically proctocolectomy versus just leaving end ileostomy without further surgery at this time.  This would be after hospitalization and rehab stay

## 2016-02-18 NOTE — Telephone Encounter (Signed)
[facility] short stay is calling wanting to know if Sara Barnes is still going to receive Remicade on 02/25/16. Also asking about possible reaction she had when inpt. Will she receive solu-medrol as premed? Please advise.

## 2016-02-18 NOTE — Telephone Encounter (Signed)
Appointment cancelled at [facility] for Remicade in October.

## 2016-02-18 NOTE — Progress Notes (Signed)
Physical Therapy Treatment Patient Details Name: MAJESTA LEICHTER MRN: [id number] DOB: [birth year] Today's Date: 02/18/2016    History of Present Illness 75 yo female s/p end ileostomy 10/17    PT Comments    Pt not feeling her best on today however she did agree to work with therapy. Per chart, pt with new SBO/ileus. Will continue to follow and progress activity as tolerated. Pt reported she wants to get back to not needing a walker and being more active.    Follow Up Recommendations  SNF     Equipment Recommendations       Recommendations for Other Services       Precautions / Restrictions Precautions Precautions: Fall Restrictions Weight Bearing Restrictions: No    Mobility  Bed Mobility Overal bed mobility: Needs Assistance Bed Mobility: Supine to Sit;Sit to Supine     Supine to sit: Supervision;HOB elevated Sit to supine: Min guard;HOB elevated   General bed mobility comments: close guard for safety. Increased time. Some mild difficulty getting LEs back onto bed.   Transfers Overall transfer level: Needs assistance Equipment used: Rolling walker (2 wheeled) Transfers: Sit to/from Stand Sit to Stand: Min guard         General transfer comment: close guad for safety. VCs safety  Ambulation/Gait Ambulation/Gait assistance: Min assist Ambulation Distance (Feet): 115 Feet Assistive device: Rolling walker (2 wheeled) Gait Pattern/deviations: Step-through pattern;Decreased stride length     General Gait Details: intermittent assist to maneuver with RW. Small collision with RW and objects in environment when re-entering room.  Pt tolerated distance well.    Stairs            Wheelchair Mobility    Modified Rankin (Stroke Patients Only)       Balance                                    Cognition Arousal/Alertness: Awake/alert Behavior During Therapy: WFL for tasks assessed/performed Overall Cognitive Status: Within Functional  Limits for tasks assessed                      Exercises  Long arc quads, 15 reps, sitting Quad sets, 15 reps, supine Ankle pumps, 15 reps, supine    General Comments        Pertinent Vitals/Pain Pain Assessment: 0-10 Pain Score: 7  Pain Location: abdomen Pain Descriptors / Indicators: Sore Pain Intervention(s): Limited activity within patient's tolerance    Home Living                      Prior Function            PT Goals (current goals can now be found in the care plan section)      Frequency    Min 3X/week      PT Plan Current plan remains appropriate    Co-evaluation             End of Session   Activity Tolerance: Patient tolerated treatment well Patient left: in bed;with call bell/phone within reach;with bed alarm set;with family/visitor present     Time: 1422-1435 PT Time Calculation (min) (ACUTE ONLY): 13 min  Charges:  $Gait Training: 8-22 mins                    G Codes:      Weston Anna, MPT Pager: [phone]

## 2016-02-18 NOTE — Telephone Encounter (Signed)
I need to review recent history and discuss with colleagues before making decision on re-dosing Remicade

## 2016-02-18 NOTE — Telephone Encounter (Signed)
Should I leave her appt as scheduled for now?

## 2016-02-18 NOTE — Consult Note (Signed)
[facility] Nurse ostomy follow up Per family (sister, Vaughan Basta) request, teaching session will be when she is also in [place].  Arrangements made for teaching session with pouch change tomorrow (Tuesday) at 9:30am. [facility] team will follow, and will remain available to this patient, the nursing, surgical and medical teams.  Thanks, Maudie Flakes, MSN, RN, [place], Arther Abbott  Pager# [phone]

## 2016-02-18 NOTE — Telephone Encounter (Signed)
I expressed that social work would have to find available facilities but that patient and family should have some discretion/opinion in where she goes. I believe there previously was at [facility] Hopefully they can find a suitable rehabilitation facility for her recovery They will need to work with hospital social work to arrange this placement, based on bed availability

## 2016-02-18 NOTE — Progress Notes (Signed)
Patient ID: Sara Barnes, female   DOB: November 23, 1940, 75 y.o.   MRN: [id number]  [facility] Surgery Progress Note  7 Days Post-Op  Subjective: Feels the same. She did have 1 episode of nausea this morning, no emesis.   Objective: Vital signs in last 24 hours: Temp:  [97.8 F (36.6 C)-98.9 F (37.2 C)] 98.6 F (37 C) (10/23 4709) Pulse Rate:  [68-81] 81 (10/23 0652) Resp:  [18] 18 (10/22 2108) BP: (140-154)/(64-77) 154/64 (10/23 0652) SpO2:  [97 %-100 %] 98 % (10/23 0652) Last BM Date: 02/17/16  Intake/Output from previous day: 10/22 0701 - 10/23 0700 In: 2125 [I.V.:2125] Out: -  Intake/Output this shift: No intake/output data recorded.  PE: Gen:  Alert, NAD, pleasant Pulm:  Effort normal Abd: Soft, mildly diffusely tender, distended, +BS, incisions C/D/I, RLQ ostomy pink with small amount liquid stool and air in bag Perineum: penrose drain in place with minimal purulent drain, skin erythematous but no breakdown  Lab Results:   Recent Labs  02/17/16 0653 02/18/16 0524  WBC 9.6 9.6  HGB 9.4* 9.6*  HCT 29.1* 30.5*  PLT 248 224   BMET  Recent Labs  02/17/16 0653 02/18/16 0524  NA 135 135  K 4.0 3.5  CL 102 104  CO2 25 25  GLUCOSE 77 83  BUN 8 5*  CREATININE 0.72 0.61  CALCIUM 8.6* 8.3*   PT/INR No results for input(s): LABPROT, INR in the last 72 hours. CMP     Component Value Date/Time   NA 135 02/18/2016 0524   NA 138 12/06/2014 0804   K 3.5 02/18/2016 0524   K 4.1 12/06/2014 0804   CL 104 02/18/2016 0524   CO2 25 02/18/2016 0524   CO2 25 12/06/2014 0804   GLUCOSE 83 02/18/2016 0524   GLUCOSE 92 12/06/2014 0804   BUN 5 (L) 02/18/2016 0524   BUN 12.3 12/06/2014 0804   CREATININE 0.61 02/18/2016 0524   CREATININE 0.8 12/06/2014 0804   CALCIUM 8.3 (L) 02/18/2016 0524   CALCIUM 9.4 12/06/2014 0804   PROT 5.2 (L) 02/12/2016 0517   PROT 6.5 12/06/2014 0804   ALBUMIN 2.5 (L) 02/12/2016 0517   ALBUMIN 3.3 (L) 12/06/2014 0804   AST 13 (L)  02/12/2016 0517   AST 9 12/06/2014 0804   ALT 14 02/12/2016 0517   ALT 10 12/06/2014 0804   ALKPHOS 57 02/12/2016 0517   ALKPHOS 89 12/06/2014 0804   BILITOT 0.5 02/12/2016 0517   BILITOT 0.37 12/06/2014 0804   GFRNONAA >60 02/18/2016 0524   GFRAA >60 02/18/2016 0524   Lipase     Component Value Date/Time   LIPASE 31 04/15/2011 1700       Studies/Results: Dg Abd 2 Views  Result Date: 02/18/2016 CLINICAL DATA:  Ileus. EXAM: ABDOMEN - 2 VIEW COMPARISON:  02/17/2016. FINDINGS: Soft tissue structures are unremarkable. Multiple dilated loops of small bowel are noted. These findings suggest small bowel obstruction. Ostomy noted in the right lower quadrant. No free air. IMPRESSION: Persistent multiple dilated loops of small bowel suggesting small bowel obstruction. No interim change. Ostomy noted in the right lower quadrant. Electronically Signed   By: Marcello Moores  Register   On: 02/18/2016 09:36   Dg Abd 2 Views  Result Date: 02/17/2016 CLINICAL DATA:  Crohn's disease, status post diverting ileostomy 4 days ago, ileus EXAM: ABDOMEN - 2 VIEW COMPARISON:  02/16/2016 FINDINGS: Right lower quadrant ostomy noted. Similar diffuse gas distention of the small bowel throughout the abdomen and pelvis. Small bowel  diameter 3.2 cm, previously 3.7 cm. Diffuse air-fluid levels noted on the upright exam. Appearance compatible with distal small bowel obstruction versus severe ileus. No free air evident. Lung bases clear. Stable cardiomegaly. Degenerative changes of the spine. No abnormal calcifications. IMPRESSION: Similar pattern of diffuse small bowel gaseous distension compatible with distal small bowel obstruction versus severe ileus. No free air evident by plain radiography Electronically Signed   By: Jerilynn Mages.  Shick M.D.   On: 02/17/2016 10:12    Anti-infectives: Anti-infectives    Start     Dose/Rate Route Frequency Ordered Stop   02/11/16 1600  piperacillin-tazobactam (ZOSYN) IVPB 3.375 g     3.375 g 100  mL/hr over 30 Minutes Intravenous Every 8 hours 02/11/16 1403 02/11/16 1614   02/11/16 1000  tinidazole (TINDAMAX) tablet 500 mg     500 mg Oral 2 times daily 02/08/16 1124     02/10/16 1400  neomycin (MYCIFRADIN) tablet 1,000 mg     1,000 mg Oral 3 times daily 02/08/16 1603 02/10/16 2045   02/10/16 1400  metroNIDAZOLE (FLAGYL) tablet 500 mg  Status:  Discontinued     500 mg Oral 3 times daily 02/08/16 1603 02/10/16 0802   02/08/16 2000  metroNIDAZOLE (FLAGYL) tablet 500 mg  Status:  Discontinued     500 mg Oral Every 8 hours 02/08/16 1613 02/11/16 1500   02/02/16 1100  metroNIDAZOLE (FLAGYL) tablet 500 mg  Status:  Discontinued     500 mg Oral Every 12 hours 02/02/16 1024 02/08/16 1611   02/02/16 1100  ciprofloxacin (CIPRO) tablet 500 mg     500 mg Oral 2 times daily 02/02/16 1024     01/31/16 1400  fluconazole (DIFLUCAN) tablet 200 mg     200 mg Oral  Once 01/31/16 1204     01/31/16 0000  imipenem-cilastatin (PRIMAXIN) 500 mg in sodium chloride 0.9 % 100 mL IVPB  Status:  Discontinued     500 mg 200 mL/hr over 30 Minutes Intravenous Every 6 hours 01/30/16 1840 01/31/16 0957   01/30/16 1900  imipenem-cilastatin (PRIMAXIN) 500 mg in sodium chloride 0.9 % 100 mL IVPB     500 mg 200 mL/hr over 30 Minutes Intravenous STAT 01/30/16 1840 01/30/16 1956   01/29/16 2200  metroNIDAZOLE (FLAGYL) tablet 500 mg  Status:  Discontinued     500 mg Oral Every 8 hours 01/29/16 1700 01/30/16 1829   01/29/16 2000  ciprofloxacin (CIPRO) tablet 500 mg  Status:  Discontinued     500 mg Oral 2 times daily 01/29/16 1700 01/30/16 1829   01/22/16 0900  piperacillin-tazobactam (ZOSYN) IVPB 3.375 g  Status:  Discontinued     3.375 g 12.5 mL/hr over 240 Minutes Intravenous Every 8 hours 01/22/16 0840 01/29/16 1700   01/21/16 1830  ciprofloxacin (CIPRO) IVPB 400 mg  Status:  Discontinued     400 mg 200 mL/hr over 60 Minutes Intravenous Every 12 hours 01/21/16 1744 01/22/16 0840   01/21/16 1830  metroNIDAZOLE  (FLAGYL) IVPB 500 mg  Status:  Discontinued     500 mg 100 mL/hr over 60 Minutes Intravenous Every 6 hours 01/21/16 1744 01/22/16 0840       Assessment/Plan Crohns  - tapering prednisone per GI, now to 17m - Remicade per GI if continuing 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 MHassell Done- POD7 - ileus - XR today show persistent multiple dilated  loops of small bowel suggesting small bowel obstruction - some air and liquid stool in bag  ABL anemia - hg 9.6, stable Chronic pain Anxiety   ID - cipro 10/7>>, tinidazole 10/16>> FEN - NPO (since 02/16/16) VTE - heparin, SCDs  Plan - continue NPO, mobilization. Some air in bag which is promising. Will consider PICC/TPN if unable to advance diet soon. XR in AM   LOS: 41 days    Jerrye Beavers , [facility] Surgery 02/18/2016, 10:24 AM Pager: [phone] Consults: [phone] Mon-Fri 7:00 am-4:30 pm Sat-Sun 7:00 am-11:30 am

## 2016-02-18 NOTE — Telephone Encounter (Signed)
Spoke with pts sister and she is aware.

## 2016-02-18 NOTE — Progress Notes (Signed)
Progress Note   Subjective  Chief Complaint: Crohn's colitis complicated by perirectal abscess, s/p end ileostomy due to complications  Pt doing "as well as I can be" today, expresses that she continues to try and get up and move around, her abdominal pain "comes and goes". She has been passing a small amt of stool and gas in ostomy, surgery is following. She tells me that they have started her on clear liquid diet today.   Objective   Vital signs in last 24 hours: Temp:  [97.8 F (36.6 C)-98.9 F (37.2 C)] 98.6 F (37 C) (10/23 5102) Pulse Rate:  [68-81] 81 (10/23 0652) Resp:  [18] 18 (10/22 2108) BP: (140-154)/(64-77) 154/64 (10/23 0652) SpO2:  [97 %-100 %] 98 % (10/23 0652) Last BM Date: 02/17/16 General:  Caucasian female in NAD Heart:  Regular rate and rhythm; no murmurs Lungs: Respirations even and unlabored, lungs CTA bilaterally Abdomen:  Soft, mild generalized tenderness and nondistended. Normal bowel sounds.ostomy in RLQ with stool and gas Extremities:  Without edema. Neurologic:  Alert and oriented,  grossly normal neurologically. Psych:  Cooperative. Normal mood and affect.  Intake/Output from previous day: 10/22 0701 - 10/23 0700 In: 2125 [I.V.:2125] Out: -   Lab Results:  Recent Labs  02/16/16 0554 02/17/16 0653 02/18/16 0524  WBC 16.0* 9.6 9.6  HGB 12.0 9.4* 9.6*  HCT 38.0 29.1* 30.5*  PLT 383 248 224   BMET  Recent Labs  02/16/16 0554 02/17/16 0653 02/18/16 0524  NA 136 135 135  K 4.2 4.0 3.5  CL 100* 102 104  CO2 26 25 25   GLUCOSE 88 77 83  BUN 10 8 5*  CREATININE 0.68 0.72 0.61  CALCIUM 9.0 8.6* 8.3*   LFT No results for input(s): PROT, ALBUMIN, AST, ALT, ALKPHOS, BILITOT, BILIDIR, IBILI in the last 72 hours. PT/INR No results for input(s): LABPROT, INR in the last 72 hours.  Studies/Results: Dg Abd 2 Views  Result Date: 02/18/2016 CLINICAL DATA:  Ileus. EXAM: ABDOMEN - 2 VIEW COMPARISON:  02/17/2016. FINDINGS: Soft tissue  structures are unremarkable. Multiple dilated loops of small bowel are noted. These findings suggest small bowel obstruction. Ostomy noted in the right lower quadrant. No free air. IMPRESSION: Persistent multiple dilated loops of small bowel suggesting small bowel obstruction. No interim change. Ostomy noted in the right lower quadrant. Electronically Signed   By: Marcello Moores  Register   On: 02/18/2016 09:36   Dg Abd 2 Views  Result Date: 02/17/2016 CLINICAL DATA:  Crohn's disease, status post diverting ileostomy 4 days ago, ileus EXAM: ABDOMEN - 2 VIEW COMPARISON:  02/16/2016 FINDINGS: Right lower quadrant ostomy noted. Similar diffuse gas distention of the small bowel throughout the abdomen and pelvis. Small bowel diameter 3.2 cm, previously 3.7 cm. Diffuse air-fluid levels noted on the upright exam. Appearance compatible with distal small bowel obstruction versus severe ileus. No free air evident. Lung bases clear. Stable cardiomegaly. Degenerative changes of the spine. No abnormal calcifications. IMPRESSION: Similar pattern of diffuse small bowel gaseous distension compatible with distal small bowel obstruction versus severe ileus. No free air evident by plain radiography Electronically Signed   By: Jerilynn Mages.  Shick M.D.   On: 02/17/2016 10:12     Assessment / Plan:   Assessment: 1.  75 y/o female with colitis complicated by perirectal abscess / fistula, suggestive of Crohns disease, who has had a prolonged hospitalization. She did not go into remission with IV steroids, placed on Remicade, developed perirectal abscess leading to surgery.  Ultimately got diverting ileostomy and she had been recovering well, course most recently complicated by ileus / SBO at the ostomy for the past 4 days. Xrays not significantly changed today. She's had no symptoms of bleeding.   Plan: 1. Prednisone continuing to be tapered, remain at 58m today 2. Awaiting recommendations from Dr. PHilarie Fredricksonregarding Remicade 3. Appreciate  surgery's recs regarding ongoing SBO 4. Please await further recs from Dr. DLoletha Carrow We will continue to follow.   LOS: 41 days   JLevin Erp 02/18/2016, 12:13 PM  Pager # [phone] I have discussed the case with the PA, and that is the plan I formulated. I personally interviewed and examined the patient.  SBO may be slowly improving, as she has passed gas a small amount of liquid in the bag.  She has very active bowel sounds, but remains tympanitic and softly distended with scattered tenderness and no guarding.  Continue low dose prednisone, we are holding off on remicade while she heals from surgery.  She is also concerned that she may have had an infusion reaction the last time.  I will need to ask my partners who have seen her before if they have any more info about this.  Dr PHilarie Fredricksonknows her best.  We will follow peripherally and plan to see her the day after tomorrow unless called sooner.    HNelida MeuseIII Pager [phone] Mon-Fri 8a-5p [phone] 5p, weekends, holidays

## 2016-02-18 NOTE — Care Management Important Message (Signed)
Important Message  Patient Details  Name: Sara Barnes MRN: [id number] Date of Birth: [birth year]   Medicare Important Message Given:  Yes    [name] 02/18/2016, 10:18 AMImportant Message  Patient Details  Name: Sara Barnes MRN: [id number] Date of Birth: 05/28/1940   Medicare Important Message Given:  Yes    [name] 02/18/2016, 10:18 AM

## 2016-02-19 ENCOUNTER — Inpatient Hospital Stay (HOSPITAL_COMMUNITY): Payer: Commercial Managed Care - HMO

## 2016-02-19 LAB — CBC
HEMATOCRIT: 29.1 % — AB (ref 36.0–46.0)
HEMOGLOBIN: 9.3 g/dL — AB (ref 12.0–15.0)
MCH: 29 pg (ref 26.0–34.0)
MCHC: 32 g/dL (ref 30.0–36.0)
MCV: 90.7 fL (ref 78.0–100.0)
Platelets: 227 10*3/uL (ref 150–400)
RBC: 3.21 MIL/uL — AB (ref 3.87–5.11)
RDW: 15.1 % (ref 11.5–15.5)
WBC: 7.5 10*3/uL (ref 4.0–10.5)

## 2016-02-19 LAB — BASIC METABOLIC PANEL
ANION GAP: 5 (ref 5–15)
CHLORIDE: 104 mmol/L (ref 101–111)
CO2: 27 mmol/L (ref 22–32)
Calcium: 8.5 mg/dL — ABNORMAL LOW (ref 8.9–10.3)
Creatinine, Ser: 0.66 mg/dL (ref 0.44–1.00)
Glucose, Bld: 88 mg/dL (ref 65–99)
POTASSIUM: 3.4 mmol/L — AB (ref 3.5–5.1)
SODIUM: 136 mmol/L (ref 135–145)

## 2016-02-19 LAB — MAGNESIUM: MAGNESIUM: 1.7 mg/dL (ref 1.7–2.4)

## 2016-02-19 MED ORDER — POTASSIUM CHLORIDE CRYS ER 20 MEQ PO TBCR
40.0000 meq | EXTENDED_RELEASE_TABLET | Freq: Once | ORAL | Status: AC
  Administered 2016-02-19: 40 meq via ORAL
  Filled 2016-02-19: qty 2

## 2016-02-19 MED ORDER — SODIUM CHLORIDE 0.9% FLUSH
10.0000 mL | INTRAVENOUS | Status: DC | PRN
  Administered 2016-02-20 – 2016-02-28 (×2): 10 mL
  Filled 2016-02-19 (×2): qty 40

## 2016-02-19 MED ORDER — MAGNESIUM SULFATE 2 GM/50ML IV SOLN
2.0000 g | Freq: Once | INTRAVENOUS | Status: AC
  Administered 2016-02-19: 2 g via INTRAVENOUS
  Filled 2016-02-19: qty 50

## 2016-02-19 MED ORDER — SIMETHICONE 80 MG PO CHEW
160.0000 mg | CHEWABLE_TABLET | Freq: Four times a day (QID) | ORAL | Status: DC | PRN
  Administered 2016-02-19 – 2016-02-21 (×2): 160 mg via ORAL
  Filled 2016-02-19 (×2): qty 2

## 2016-02-19 NOTE — Progress Notes (Signed)
Peripherally Inserted Central Catheter/Midline Placement  The IV Nurse has discussed with the patient and/or persons authorized to consent for the patient, the purpose of this procedure and the potential benefits and risks involved with this procedure.  The benefits include less needle sticks, lab draws from the catheter, and the patient may be discharged home with the catheter. Risks include, but not limited to, infection, bleeding, blood clot (thrombus formation), and puncture of an artery; nerve damage and irregular heartbeat and possibility to perform a PICC exchange if needed/ordered by physician.  Alternatives to this procedure were also discussed.  Bard Power PICC patient education guide, fact sheet on infection prevention and patient information card has been provided to patient /or left at bedside.    PICC/Midline Placement Documentation        Sara Barnes 02/19/2016, 2:00 PM

## 2016-02-19 NOTE — Progress Notes (Signed)
[facility] Nurse Maudie Flakes called and asked that patient demonstrate to how empty ostomy pouch under observation.  Nurse Tech went to do help patient, but ended up emptying the pouch for the patient.  Will pass on to oncoming shift, that patient needs to show that she can empty pouch.

## 2016-02-19 NOTE — Consult Note (Signed)
[facility] Nurse ostomy follow up Stoma type/location: RUQ ileostomy (loop) created on 02/11/16, Dr. Lilyan Gilford assessment/size: 1 and 3/8 inches round, red, budded, with proximal limb at top. Distal limb with sloughing mucosal lining. Peristomal assessment: intact, erythematous.  No skin barrier ring used with last pouch change.  Will re implement today. Treatment options for stomal/peristomal skin: skin barrier ring Output brown soft effluent Ostomy pouching: 1pc convex pouching system with skin barrier ring Education provided: Extended session with patient's sister, Vaughan Basta present.  Demonstration of pouch removal, pouch preparation, application.  Discussed pouching system change frequency (twice per week) and sample days schedules I.e. Monday/Thursday, Tuesday/Friday, Saturday/Wednesday. Discussed purpose of [facility] and expected call frequency. Demonstration with return demo of closing and opening tail closure and discussion and simulation of pouch tail closure cleansing.  Discussed variations on this theme with other care providers, patient and sister will clean only the bottom 2-inches of the tail closure using toilet paper wicks. Diet, activity and routines for pouch emptying discussed.  Patient and sister both have educational booklet and 6 pouching set-ups are provided for discharge as well as contact information for questions post discharge.  Both express appreciation for style and length of visit. Plan is for discharge to a Rehab facility for a few weeks and then to return home with [facility] and sister's intermittent support until re anastamosis is possible-perhaps in the Spring. Enrolled patient in [facility] Discharge program: Yes [facility] nursing team will follow, and will remain available to this patient, the nursing, surgical and medical teams.  Thanks, Maudie Flakes, MSN, RN, [place], Arther Abbott  Pager# [phone]

## 2016-02-19 NOTE — Progress Notes (Signed)
PROGRESS NOTE    Sara Barnes  PXT:[id number] DOB: 10-Jun-1940 DOA: 01/07/2016 PCP: Ann Held, DO    Brief Narrative:  Beata Beason [name] a 75 y.o.femalewith medical history significant of ulcerative colitis versus Crohn's disease, believed to be UC eventually,otherwise healthy, presents to the emergency room with chief complaint of severe abdominal pain for the past several days. She was recently hospitalized and discharged home about 9 days prior to admission. She was here in the hospital with similar complaints, placed on steroids, and tells me today that she improved some and really wanted to go home. At home, she has had ongoing symptoms, never really well achieved full remission, and over the last few days she has felt extremely unwell. She has crampy abdominal pain, nausea, poor appetite, barely able to keep down some Gatorade, and has been having significant amounts of bloody bowel movements as well. She has no fever or chills. She has no chest pain or shortness of breath. She denies any palpitations. She has no lightheadedness or dizziness. She feels generalized weakness. In the emergency room her vital signs are stable, her blood work is essentially unremarkable. Gastroenterology was consulted by EDP and have evaluated patient, and TR[facility] was asked for admission.   Interim history Gastroenterology consultation evaluated the patient. She underwent flexible sigmoidoscopy. CMV was negative and she was started on Remicade. She was initially doing better and tolerating some diet and since still having a lot of pain and frequent Bms she was dosed with another Remicaide infusion on 9/17. On 9/21 pain and bloody clots worsened overnight. CT abd/pelvis showed perirectal abscess- General surgery was consulted. Patient underwent I&D on 9/26. Steroids were decreased per GI recommendations. Patient received dose of amikacin on 01/28/2016. Clinically continuing to improve. Patient is working with  physical therapy  UC vs crohn's flare, perirectal abscess s/p drain, S/p remicade, Currently on IV Solu-Medrol, cipro and tinidazole.  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 Started on clear liquid diet 10/17. Patient made nothing by mouth secondary to ileus. Ileus slowly resolving patient's diet has been started back up and patient's diet has been advanced to full liquids. General surgery and GI following.    Assessment & Plan:   Principal Problem:   Ulcerative pancolitis with abscess [facility]) Active Problems:   Perirectal abscess   Rectal pain   Ulcerative pancolitis with rectal bleeding (HCC)   IBS (irritable bowel syndrome)   Abdominal pain   GI bleed   Left upper quadrant pain   Absolute anemia   Acute blood loss anemia   Difficulty in walking, not elsewhere classified   Other depression due to general medical condition   Fever   Hypokalemia   Ileostomy in place    Generalized anxiety disorder   Ileus ([place])   Small bowel obstruction  #1 ulcerative pancolitis with rectal bleeding/perianal abscess Patient status post flexible sigmoidoscopy 01/08/2016 with biopsies and findings consistent with active erosive ulcer. CMV was negative. Patient was given Remicade 01/09/2016, 01/13/2016, 01/28/2016. Patient status post diverting end ileostomy 02/11/2016. Patient with emesis and significant pain. Colostomy without air noted. Abdominal films ordered per general surgery. Patient currently on oral ciprofloxacin and tinidazole as well as oral prednisone taper which will need to be tapered over several weeks per GI. NSL IVF. Penrose drain was to be discontinued however still in. Per GI and general surgery.  #2 acute blood loss anemia Status post 4 units packed red blood cells. H&H stable.  #3  hypokalemia Repleted.  #4 pain management Continue oral MS Contin twice a day for long-acting regimen. Discontinued Vicodin. Continue MSIR 15-51m every 4  hours as needed when necessary breakthrough pain. IV morphine when necessary severe pain breakthrough. Patient has been on a stool softener.  #5 generalized anxiety disorder Ativan as needed.  #6 ileus/small bowel obstruction at ostomy  patient states some increasing gas and loose stool noted in ostomy pouch. Plain films which were obtained were consistent with ileus versus small bowel obstruction. General surgery and GI following. Patient has been started on clear liquids per general surgery which has been advanced to full liquid due to increasing stool output in ostomy bag and flatus.  Per general surgery if patient with worsening abdominal pain or nausea will recommend placement of NG tube.    DVT prophylaxis: Heparin Code Status: Full Family Communication: Updated patient and sister at bedside. Disposition Plan: skilled nursing facility.   Consultants:   Gen. surgery Dr. GHarlow Asa09/26/2017  Gastroenterology Dr. AHavery Moros09/02/2016  Procedures:   CT pelvis 01/31/2016  CT abdomen and pelvis 01/21/2016  Chest x-ray 01/30/2016  Acute abdominal series 01/07/2016  Laparoscopic and ileostomy are Dr. MHassell Done10/16/2017  Flexible sigmoidoscopy 01/08/2016 Dr. AHavery Moros Incision, drainage and packing of complex perirectal abscess per Dr. GHarlow Asa09/26/2017  Status post 4 units packed red blood cells 9/25-26  Abdominal x-ray 02/15/2016  Antimicrobials:   IV ciprofloxacin 01/21/2016>>>>> 01/22/2016  Oral ciprofloxacin 02/02/2016  IV ciprofloxacin 01/29/2016>>>> 01/30/2016  IV Primaxin 01/30/2016>>>>> 01/31/2016  IV Flagyl 01/21/2016>>.. 01/22/2016  IV Flagyl 01/29/2016>>>>> 02/11/2016  IV Zosyn 01/22/2016>>>> 01/29/2016  IV Zosyn 02/11/2016>>>> 02/11/2016  Oral tinidazole 02/11/2016                 Subjective: Patient states some abdominal pain however seems to be improving. Patient states feels a little more gas noted in ostomy bag. Stool output picking up in  ostomy bag per patient. No CP, no SOB, no nausea, no emesis. Feeling better.  Objective: Vitals:   02/18/16 0652 02/18/16 1542 02/18/16 2047 02/19/16 0533  BP: (!) 154/64 121/66 112/60 (!) 152/66  Pulse: 81 83 71 70  Resp:    17  Temp: 98.6 F (37 C) 98.6 F (37 C) 98.1 F (36.7 C) 97.7 F (36.5 C)  TempSrc: Oral Oral Oral Oral  SpO2: 98% 97% 98% 99%  Weight:      Height:        Intake/Output Summary (Last 24 hours) at 02/19/16 1238 Last data filed at 02/19/16 0500  Gross per 24 hour  Intake             1080 ml  Output               45 ml  Net             1035 ml   Filed Weights   01/07/16 1136 02/08/16 1416  Weight: 72.6 kg (160 lb) 67.3 kg (148 lb 4.8 oz)    Examination:  General exam: Appears calm and comfortable.  Respiratory system: Clear to auscultation. Respiratory effort normal. Cardiovascular system: S1 & S2 heard, RRR. No JVD, murmurs, rubs, gallops or clicks. No pedal edema. Gastrointestinal system: Abdomen is nondistended, soft and some tenderness to palpation. Colostomy bag intact with some gas noted, liquid stool noted. No organomegaly or masses felt. Normal bowel sounds heard. Central nervous system: Alert and oriented. No focal neurological deficits. Extremities: Symmetric 5 x 5 power. Right upper extremity with some swelling around IV  site. Skin: No rashes, lesions or ulcers Psychiatry: Judgement and insight appear normal. Mood & affect appropriate.     Data Reviewed: I have personally reviewed following labs and imaging studies  CBC:  Recent Labs Lab 02/15/16 0550 02/16/16 0554 02/17/16 0653 02/18/16 0524 02/19/16 0550  WBC 17.6* 16.0* 9.6 9.6 7.5  NEUTROABS  --   --  6.2  --   --   HGB 12.7 12.0 9.4* 9.6* 9.3*  HCT 38.9 38.0 29.1* 30.5* 29.1*  MCV 89.6 92.5 93.6 93.8 90.7  PLT 383 383 248 224 625   Basic Metabolic Panel:  Recent Labs Lab 02/15/16 0550 02/16/16 0554 02/17/16 0653 02/18/16 0524 02/19/16 0550  NA 134* 136 135 135  136  K 3.8 4.2 4.0 3.5 3.4*  CL 97* 100* 102 104 104  CO2 28 26 25 25 27   GLUCOSE 90 88 77 83 88  BUN 7 10 8  5* <5*  CREATININE 0.79 0.68 0.72 0.61 0.66  CALCIUM 9.2 9.0 8.6* 8.3* 8.5*  MG  --  2.2 1.9 1.8 1.7   GFR: Estimated Creatinine Clearance: 57.8 mL/min (by C-G formula based on SCr of 0.66 mg/dL). Liver Function Tests: No results for input(s): AST, ALT, ALKPHOS, BILITOT, PROT, ALBUMIN in the last 168 hours. No results for input(s): LIPASE, AMYLASE in the last 168 hours. No results for input(s): AMMONIA in the last 168 hours. Coagulation Profile: No results for input(s): INR, PROTIME in the last 168 hours. Cardiac Enzymes: No results for input(s): CKTOTAL, CKMB, CKMBINDEX, TROPONINI in the last 168 hours. BNP (last 3 results) No results for input(s): PROBNP in the last 8760 hours. HbA1C: No results for input(s): HGBA1C in the last 72 hours. CBG:  Recent Labs Lab 02/17/16 1357  GLUCAP 87   Lipid Profile: No results for input(s): CHOL, HDL, LDLCALC, TRIG, CHOLHDL, LDLDIRECT in the last 72 hours. Thyroid Function Tests: No results for input(s): TSH, T4TOTAL, FREET4, T3FREE, THYROIDAB in the last 72 hours. Anemia Panel: No results for input(s): VITAMINB12, FOLATE, FERRITIN, TIBC, IRON, RETICCTPCT in the last 72 hours. Sepsis Labs: No results for input(s): PROCALCITON, LATICACIDVEN in the last 168 hours.  No results found for this or any previous visit (from the past 240 hour(s)).       Radiology Studies: Dg Abd 1 View  Result Date: 02/19/2016 CLINICAL DATA:  02/18/2016. EXAM: ABDOMEN - 1 VIEW COMPARISON:  None. FINDINGS: The bowel gas pattern is normal. No radio-opaque calculi or other significant radiographic abnormality are seen. Persistent dilated loops of small bowel again noted consistent with small bowel obstruction. Ostomy is noted in right lower quadrant. No free air. No acute bony abnormality . IMPRESSION: Persistent unchanged dilated loops of small bowel  consistent small bowel obstruction. Ostomy noted in the right lower quadrant. Electronically Signed   By: Marcello Moores  Register   On: 02/19/2016 07:14   Dg Abd 2 Views  Result Date: 02/18/2016 CLINICAL DATA:  Ileus. EXAM: ABDOMEN - 2 VIEW COMPARISON:  02/17/2016. FINDINGS: Soft tissue structures are unremarkable. Multiple dilated loops of small bowel are noted. These findings suggest small bowel obstruction. Ostomy noted in the right lower quadrant. No free air. IMPRESSION: Persistent multiple dilated loops of small bowel suggesting small bowel obstruction. No interim change. Ostomy noted in the right lower quadrant. Electronically Signed   By: Marcello Moores  Register   On: 02/18/2016 09:36        Scheduled Meds: . ciprofloxacin  500 mg Oral BID  . dicyclomine  10 mg Oral TID  AC & HS  . famotidine  20 mg Oral BID  . feeding supplement (ENSURE ENLIVE)  237 mL Oral Q24H  . fluconazole  200 mg Oral Once  . FLUoxetine  40 mg Oral Daily  . heparin  5,000 Units Subcutaneous Q8H  . hydrocortisone  25 mg Rectal Daily  . mesalamine  1,000 mg Rectal QHS  . midazolam  2 mg Intravenous Once  . morphine  30 mg Oral Q12H  . predniSONE  20 mg Oral QAC breakfast  . senna-docusate  2 tablet Oral BID  . tinidazole  500 mg Oral BID   Continuous Infusions: . dextrose 5 % and 0.9% NaCl 75 mL/hr at 02/19/16 1044     LOS: 42 days    Time spent: [place], MD [facility] Pager [phone]  If 7PM-7AM, please contact night-coverage [email or web address] Password [facility] 02/19/2016, 12:38 PM

## 2016-02-19 NOTE — Progress Notes (Signed)
PHYSICAL THERAPY TREATMENT NOTE     02/19/16 1556  PT Visit Information  Last PT Received On 02/19/16  Assistance Needed +1  History of Present Illness 75 yo female s/p end ileostomy 10/17. Per chart, pt now with an ileus/SBO.  Precautions  Precautions Fall  Restrictions  Weight Bearing Restrictions No  Pain Assessment  Pain Assessment 0-10  Pain Score 7  Pain Location abdomen  Pain Descriptors / Indicators Sore  Pain Intervention(s) Limited activity within patient's tolerance  Cognition  Arousal/Alertness Awake/alert  Behavior During Therapy WFL for tasks assessed/performed  Overall Cognitive Status Within Functional Limits for tasks assessed  General Comments pt tearful during today's session due to feeling so weak and tired from a busy day  Bed Mobility  Overal bed mobility Needs Assistance  General bed mobility comments sitting EOB  Transfers  Overall transfer level Needs assistance  Equipment used None  Transfers Sit to/from Stand  Sit to Stand Min guard  General transfer comment close guard for safety. VCs safety. No device used due to performing BERG on today  Standardized Balance Assessment  Standardized Balance Assessment  Berg Balance Test  Berg Balance Test  Sit to Stand 3  Standing Unsupported 2  Sitting with Back Unsupported but Feet Supported on Floor or Stool 4  Stand to Sit 3  Transfers 3  Standing Unsupported with Eyes Closed 3  Standing Ubsupported with Feet Together 3  From Standing, Reach Forward with Outstretched Arm 1  From Standing Position, Pick up Object from Floor 3  Pt unable to complete BERG due to weakness, fatigue. Pt became tearful and requested to cease further testing.   PT - End of Session  Equipment Utilized During Treatment Gait belt  Activity Tolerance Patient limited by fatigue  Patient left in bed;with call bell/phone within reach;with family/visitor present  PT - Assessment/Plan  PT Plan Current plan remains appropriate  PT  Frequency (ACUTE ONLY) Min 3X/week  Recommendations for Other Services OT consult  Follow Up Recommendations SNF  PT equipment Rolling walker with 5" wheels  PT Goal Progression  Progress towards PT goals Progressing toward goals (slowly)  Acute Rehab PT Goals  PT Goal Formulation With patient/family  Potential to Achieve Goals Good  PT Time Calculation  PT Start Time (ACUTE ONLY) 1507  PT Stop Time (ACUTE ONLY) 1515  PT Time Calculation (min) (ACUTE ONLY) 8 min  PT General Charges  $$ ACUTE PT VISIT 1 Procedure  PT Treatments  $Therapeutic Activity 8-22 mins   Weston Anna, MPT [phone]

## 2016-02-19 NOTE — Progress Notes (Signed)
Nutrition Follow-up  DOCUMENTATION CODES:   Not applicable  INTERVENTION:  Continue Ensure Enlive po once daily, each supplement provides 350 kcal and 20 grams of protein. If intake from meals does not improve with advancement to FLD, patient may require more Ensure.  Continue diet advancement per Surgery.  Encouraged adequate intake of protein and calories from meals and beverages on full liquid diet.  RD will get standing weight on follow-up later this week.   NUTRITION DIAGNOSIS:   Increased nutrient needs related to catabolic illness as evidenced by estimated needs.  Ongoing.  GOAL:   Patient will meet greater than or equal to 90% of their needs  Not met.  MONITOR:   PO intake, Supplement acceptance, Weight trends, Labs, I & O's  REASON FOR ASSESSMENT:   Consult New TPN/TNA (TPN prior to diverting ileostomy next week and to improve albumin)  ASSESSMENT:   75 y.o.femalewith medical history significant of ulcerative colitis versus Crohn's disease, believed to be UC eventually,otherwise healthy, presents to the emergency room with chief complaint of severe abdominal pain for the past several days PTA.  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 -Also s/p diverting ileostomy on 10/16 (POD8).  -Patient was made NPO again on 10/21 due to ileus. Was advanced to CLD 10/23. -Per Surgery PA note, there is increased stool and air output from ileostomy - ileus seems to be resolving.  -Patient advancing to full liquid diet today.  Patient reports she has a poor appetite. She has had ongoing abdominal pain (s/p operation) and occasional nausea (improved with medications). Reports some output in ileostomy bag. Patient is requesting Ensure again with advancement of diet to "regain strength." Reports she will only be able to start with one Ensure daily.  Meal Completion: 100% of clear liquid diet per patient. In the past 24 hours, patient has had 362 kcal (22% minimum  estimated kcal needs), and 3 grams protein (3% minimum estimated protein needs).   Medications reviewed and include: famotidine, magnesium sulfate 2 grams daily, potassium chloride 40 mEq once today, prednisone 20 mg daily before breakfast, senna, D5-NS @ 75 ml/hr (90 grams dextrose, 306 kcal daily).  Labs reviewed: Potassium 3.4, BUN <5.   Attempted to get standing weight for patient today, but she is deferring since she had to get PICC placed.   Discussed plan with RN.   Diet Order:  Diet full liquid Room service appropriate? Yes; Fluid consistency: Thin  Skin:  Wound (see comment) (MSAD to perineum, buttocks; closed incision rectum)  Last BM:  02/18/2016  Height:   Ht Readings from Last 1 Encounters:  01/07/16 5' 6" (1.676 m)    Weight:   Wt Readings from Last 1 Encounters:  02/08/16 148 lb 4.8 oz (67.3 kg)    Ideal Body Weight:  59.09 kg  BMI:  Body mass index is 23.94 kg/m.  Estimated Nutritional Needs:   Kcal:  1675-1915 (MSJ x 1.4-1.6)  Protein:  95-110 grams (1.4-1.6 g/kg)  Fluid:  1.6-1.9 L/day  EDUCATION NEEDS:   No education needs identified at this time  Willey Blade, MS, RD, LDN Pager: [phone] After Hours Pager: [phone]

## 2016-02-19 NOTE — Progress Notes (Addendum)
Patient ID: Sara Barnes, female   DOB: 28-Mar-1941, 75 y.o.   MRN: [id number]  [facility] Surgery Progress Note  8 Days Post-Op  Subjective: Tolerating clear liquids. Denies n/v. Has increased stool output from ileostomy.  Objective: Vital signs in last 24 hours: Temp:  [97.7 F (36.5 C)-98.6 F (37 C)] 97.7 F (36.5 C) (10/24 0533) Pulse Rate:  [70-83] 70 (10/24 0533) Resp:  [17] 17 (10/24 0533) BP: (112-152)/(60-66) 152/66 (10/24 0533) SpO2:  [97 %-99 %] 99 % (10/24 0533) Last BM Date: 02/18/16  Intake/Output from previous day: 10/23 0701 - 10/24 0700 In: 1080 [P.O.:240; I.V.:840] Out: 45 [Stool:45] Intake/Output this shift: No intake/output data recorded.  PE: Gen:  Alert, NAD, pleasant Pulm:  Effort normal Abd: Soft, mildly diffusely tender, mildly distended, +BS, incisions C/D/I, RLQ ostomy pink with soft brown stool and air in bag Perineum: penrose drain in place with minimal purulent drain, skin erythematous but no breakdown  Lab Results:   Recent Labs  02/18/16 0524 02/19/16 0550  WBC 9.6 7.5  HGB 9.6* 9.3*  HCT 30.5* 29.1*  PLT 224 227   BMET  Recent Labs  02/18/16 0524 02/19/16 0550  NA 135 136  K 3.5 3.4*  CL 104 104  CO2 25 27  GLUCOSE 83 88  BUN 5* <5*  CREATININE 0.61 0.66  CALCIUM 8.3* 8.5*   PT/INR No results for input(s): LABPROT, INR in the last 72 hours. CMP     Component Value Date/Time   NA 136 02/19/2016 0550   NA 138 12/06/2014 0804   K 3.4 (L) 02/19/2016 0550   K 4.1 12/06/2014 0804   CL 104 02/19/2016 0550   CO2 27 02/19/2016 0550   CO2 25 12/06/2014 0804   GLUCOSE 88 02/19/2016 0550   GLUCOSE 92 12/06/2014 0804   BUN <5 (L) 02/19/2016 0550   BUN 12.3 12/06/2014 0804   CREATININE 0.66 02/19/2016 0550   CREATININE 0.8 12/06/2014 0804   CALCIUM 8.5 (L) 02/19/2016 0550   CALCIUM 9.4 12/06/2014 0804   PROT 5.2 (L) 02/12/2016 0517   PROT 6.5 12/06/2014 0804   ALBUMIN 2.5 (L) 02/12/2016 0517   ALBUMIN 3.3 (L)  12/06/2014 0804   AST 13 (L) 02/12/2016 0517   AST 9 12/06/2014 0804   ALT 14 02/12/2016 0517   ALT 10 12/06/2014 0804   ALKPHOS 57 02/12/2016 0517   ALKPHOS 89 12/06/2014 0804   BILITOT 0.5 02/12/2016 0517   BILITOT 0.37 12/06/2014 0804   GFRNONAA >60 02/19/2016 0550   GFRAA >60 02/19/2016 0550   Lipase     Component Value Date/Time   LIPASE 31 04/15/2011 1700       Studies/Results: Dg Abd 1 View  Result Date: 02/19/2016 CLINICAL DATA:  02/18/2016. EXAM: ABDOMEN - 1 VIEW COMPARISON:  None. FINDINGS: The bowel gas pattern is normal. No radio-opaque calculi or other significant radiographic abnormality are seen. Persistent dilated loops of small bowel again noted consistent with small bowel obstruction. Ostomy is noted in right lower quadrant. No free air. No acute bony abnormality . IMPRESSION: Persistent unchanged dilated loops of small bowel consistent small bowel obstruction. Ostomy noted in the right lower quadrant. Electronically Signed   By: Marcello Moores  Register   On: 02/19/2016 07:14   Dg Abd 2 Views  Result Date: 02/18/2016 CLINICAL DATA:  Ileus. EXAM: ABDOMEN - 2 VIEW COMPARISON:  02/17/2016. FINDINGS: Soft tissue structures are unremarkable. Multiple dilated loops of small bowel are noted. These findings suggest small bowel obstruction.  Ostomy noted in the right lower quadrant. No free air. IMPRESSION: Persistent multiple dilated loops of small bowel suggesting small bowel obstruction. No interim change. Ostomy noted in the right lower quadrant. Electronically Signed   By: Marcello Moores  Register   On: 02/18/2016 09:36   Dg Abd 2 Views  Result Date: 02/17/2016 CLINICAL DATA:  Crohn's disease, status post diverting ileostomy 4 days ago, ileus EXAM: ABDOMEN - 2 VIEW COMPARISON:  02/16/2016 FINDINGS: Right lower quadrant ostomy noted. Similar diffuse gas distention of the small bowel throughout the abdomen and pelvis. Small bowel diameter 3.2 cm, previously 3.7 cm. Diffuse air-fluid  levels noted on the upright exam. Appearance compatible with distal small bowel obstruction versus severe ileus. No free air evident. Lung bases clear. Stable cardiomegaly. Degenerative changes of the spine. No abnormal calcifications. IMPRESSION: Similar pattern of diffuse small bowel gaseous distension compatible with distal small bowel obstruction versus severe ileus. No free air evident by plain radiography Electronically Signed   By: Jerilynn Mages.  Shick M.D.   On: 02/17/2016 10:12    Anti-infectives: Anti-infectives    Start     Dose/Rate Route Frequency Ordered Stop   02/11/16 1600  piperacillin-tazobactam (ZOSYN) IVPB 3.375 g     3.375 g 100 mL/hr over 30 Minutes Intravenous Every 8 hours 02/11/16 1403 02/11/16 1614   02/11/16 1000  tinidazole (TINDAMAX) tablet 500 mg     500 mg Oral 2 times daily 02/08/16 1124     02/10/16 1400  neomycin (MYCIFRADIN) tablet 1,000 mg     1,000 mg Oral 3 times daily 02/08/16 1603 02/10/16 2045   02/10/16 1400  metroNIDAZOLE (FLAGYL) tablet 500 mg  Status:  Discontinued     500 mg Oral 3 times daily 02/08/16 1603 02/10/16 0802   02/08/16 2000  metroNIDAZOLE (FLAGYL) tablet 500 mg  Status:  Discontinued     500 mg Oral Every 8 hours 02/08/16 1613 02/11/16 1500   02/02/16 1100  metroNIDAZOLE (FLAGYL) tablet 500 mg  Status:  Discontinued     500 mg Oral Every 12 hours 02/02/16 1024 02/08/16 1611   02/02/16 1100  ciprofloxacin (CIPRO) tablet 500 mg     500 mg Oral 2 times daily 02/02/16 1024     01/31/16 1400  fluconazole (DIFLUCAN) tablet 200 mg     200 mg Oral  Once 01/31/16 1204     01/31/16 0000  imipenem-cilastatin (PRIMAXIN) 500 mg in sodium chloride 0.9 % 100 mL IVPB  Status:  Discontinued     500 mg 200 mL/hr over 30 Minutes Intravenous Every 6 hours 01/30/16 1840 01/31/16 0957   01/30/16 1900  imipenem-cilastatin (PRIMAXIN) 500 mg in sodium chloride 0.9 % 100 mL IVPB     500 mg 200 mL/hr over 30 Minutes Intravenous STAT 01/30/16 1840 01/30/16 1956    01/29/16 2200  metroNIDAZOLE (FLAGYL) tablet 500 mg  Status:  Discontinued     500 mg Oral Every 8 hours 01/29/16 1700 01/30/16 1829   01/29/16 2000  ciprofloxacin (CIPRO) tablet 500 mg  Status:  Discontinued     500 mg Oral 2 times daily 01/29/16 1700 01/30/16 1829   01/22/16 0900  piperacillin-tazobactam (ZOSYN) IVPB 3.375 g  Status:  Discontinued     3.375 g 12.5 mL/hr over 240 Minutes Intravenous Every 8 hours 01/22/16 0840 01/29/16 1700   01/21/16 1830  ciprofloxacin (CIPRO) IVPB 400 mg  Status:  Discontinued     400 mg 200 mL/hr over 60 Minutes Intravenous Every 12 hours 01/21/16 1744 01/22/16  0840   01/21/16 1830  metroNIDAZOLE (FLAGYL) IVPB 500 mg  Status:  Discontinued     500 mg 100 mL/hr over 60 Minutes Intravenous Every 6 hours 01/21/16 1744 01/22/16 0840       Assessment/Plan Crohns  - tapering prednisone per GI, currently at 52m - Remicade per GI if continuing 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 MHassell Done- POD8 - tolerating clears, no n/v - XR today shows persistent unchanged dilated loops of small bowel consistent small bowel obstruction - increased stool and air output from ileostomy  ABL anemia - hg 9.3, stable Chronic pain Anxiety   ID - cipro 10/7>>, tinidazole 10/16>> FEN - full liquids VTE - heparin, SCDs  Plan - ileus seems to be resolving. advance to full liquids. ok to restart Ensure. Encourage mobilization.    LOS: 469days    BJerrye Beavers, [facility] Hospital[facility] 02/19/2016, 9:15 AM Pager: [phone]: [phone]-Fri 7:00 am-4:30 pm Sat-Sun 7:00 am-11:30 am  I have seen and examined this patient and agree with the assessment and plan of APP note.   Patient still with poor PO, though ostomy working and tolerated some liquids -encourage ensure, hopefully can avoid TPN

## 2016-02-20 ENCOUNTER — Inpatient Hospital Stay (HOSPITAL_COMMUNITY): Payer: Commercial Managed Care - HMO

## 2016-02-20 DIAGNOSIS — K567 Ileus, unspecified: Secondary | ICD-10-CM

## 2016-02-20 LAB — BASIC METABOLIC PANEL
Anion gap: 5 (ref 5–15)
CALCIUM: 8.5 mg/dL — AB (ref 8.9–10.3)
CO2: 26 mmol/L (ref 22–32)
CREATININE: 0.57 mg/dL (ref 0.44–1.00)
Chloride: 106 mmol/L (ref 101–111)
GFR calc Af Amer: >60 mL/min (ref >60)
Glucose, Bld: 93 mg/dL (ref 65–99)
Potassium: 3.6 mmol/L (ref 3.5–5.1)
SODIUM: 137 mmol/L (ref 135–145)

## 2016-02-20 LAB — MAGNESIUM: Magnesium: 2.2 mg/dL (ref 1.7–2.4)

## 2016-02-20 MED ORDER — POTASSIUM CHLORIDE CRYS ER 20 MEQ PO TBCR
20.0000 meq | EXTENDED_RELEASE_TABLET | Freq: Every day | ORAL | Status: DC
  Administered 2016-02-20 – 2016-02-22 (×3): 20 meq via ORAL
  Filled 2016-02-20 (×3): qty 1

## 2016-02-20 MED FILL — Medication: Qty: 1 | Status: AC

## 2016-02-20 NOTE — Progress Notes (Signed)
[facility] updated [facility] in regards to patient disposition.

## 2016-02-20 NOTE — Progress Notes (Addendum)
Patient ID: Sara Barnes, female   DOB: Oct 20, 1940, 76 y.o.   MRN: [id number]  [facility] Surgery Progress Note  9 Days Post-Op  Subjective: Some nausea yesterday, no emesis. Pain continues to improve. Tolerating small amounts of full liquids; has not yet tried Ensure.   Objective: Vital signs in last 24 hours: Temp:  [97.7 F (36.5 C)-98.6 F (37 C)] 98.6 F (37 C) (10/25 0500) Pulse Rate:  [67-100] 73 (10/25 0500) Resp:  [18-20] 20 (10/25 0500) BP: (131-150)/(75-78) 142/76 (10/25 0500) SpO2:  [96 %-99 %] 99 % (10/25 0500) Last BM Date: 02/18/16  Intake/Output from previous day: 10/24 0701 - 10/25 0700 In: 893.3 [I.V.:893.3] Out: 5 [Stool:5] Intake/Output this shift: No intake/output data recorded.  PE: Gen: Alert, NAD, pleasant Pulm: Effort normal Abd: Soft, NT, mildly distended, +BS, incisions C/D/I, RLQ ostomy pink with liquid brown stool and air in bag  Lab Results:   Recent Labs  02/18/16 0524 02/19/16 0550  WBC 9.6 7.5  HGB 9.6* 9.3*  HCT 30.5* 29.1*  PLT 224 227   BMET  Recent Labs  02/19/16 0550 02/20/16 0335  NA 136 137  K 3.4* 3.6  CL 104 106  CO2 27 26  GLUCOSE 88 93  BUN <5* <5*  CREATININE 0.66 0.57  CALCIUM 8.5* 8.5*   PT/INR No results for input(s): LABPROT, INR in the last 72 hours. CMP     Component Value Date/Time   NA 137 02/20/2016 0335   NA 138 12/06/2014 0804   K 3.6 02/20/2016 0335   K 4.1 12/06/2014 0804   CL 106 02/20/2016 0335   CO2 26 02/20/2016 0335   CO2 25 12/06/2014 0804   GLUCOSE 93 02/20/2016 0335   GLUCOSE 92 12/06/2014 0804   BUN <5 (L) 02/20/2016 0335   BUN 12.3 12/06/2014 0804   CREATININE 0.57 02/20/2016 0335   CREATININE 0.8 12/06/2014 0804   CALCIUM 8.5 (L) 02/20/2016 0335   CALCIUM 9.4 12/06/2014 0804   PROT 5.2 (L) 02/12/2016 0517   PROT 6.5 12/06/2014 0804   ALBUMIN 2.5 (L) 02/12/2016 0517   ALBUMIN 3.3 (L) 12/06/2014 0804   AST 13 (L) 02/12/2016 0517   AST 9 12/06/2014 0804   ALT  14 02/12/2016 0517   ALT 10 12/06/2014 0804   ALKPHOS 57 02/12/2016 0517   ALKPHOS 89 12/06/2014 0804   BILITOT 0.5 02/12/2016 0517   BILITOT 0.37 12/06/2014 0804   GFRNONAA >60 02/20/2016 0335   GFRAA >60 02/20/2016 0335   Lipase     Component Value Date/Time   LIPASE 31 04/15/2011 1700       Studies/Results: Dg Abd 1 View  Result Date: 02/19/2016 CLINICAL DATA:  02/18/2016. EXAM: ABDOMEN - 1 VIEW COMPARISON:  None. FINDINGS: The bowel gas pattern is normal. No radio-opaque calculi or other significant radiographic abnormality are seen. Persistent dilated loops of small bowel again noted consistent with small bowel obstruction. Ostomy is noted in right lower quadrant. No free air. No acute bony abnormality . IMPRESSION: Persistent unchanged dilated loops of small bowel consistent small bowel obstruction. Ostomy noted in the right lower quadrant. Electronically Signed   By: Marcello Moores  Register   On: 02/19/2016 07:14   Dg Abd 2 Views  Result Date: 02/18/2016 CLINICAL DATA:  Ileus. EXAM: ABDOMEN - 2 VIEW COMPARISON:  02/17/2016. FINDINGS: Soft tissue structures are unremarkable. Multiple dilated loops of small bowel are noted. These findings suggest small bowel obstruction. Ostomy noted in the right lower quadrant. No free air.  IMPRESSION: Persistent multiple dilated loops of small bowel suggesting small bowel obstruction. No interim change. Ostomy noted in the right lower quadrant. Electronically Signed   By: Marcello Moores  Register   On: 02/18/2016 09:36    Anti-infectives: Anti-infectives    Start     Dose/Rate Route Frequency Ordered Stop   02/11/16 1600  piperacillin-tazobactam (ZOSYN) IVPB 3.375 g     3.375 g 100 mL/hr over 30 Minutes Intravenous Every 8 hours 02/11/16 1403 02/11/16 1614   02/11/16 1000  tinidazole (TINDAMAX) tablet 500 mg     500 mg Oral 2 times daily 02/08/16 1124     02/10/16 1400  neomycin (MYCIFRADIN) tablet 1,000 mg     1,000 mg Oral 3 times daily 02/08/16 1603  02/10/16 2045   02/10/16 1400  metroNIDAZOLE (FLAGYL) tablet 500 mg  Status:  Discontinued     500 mg Oral 3 times daily 02/08/16 1603 02/10/16 0802   02/08/16 2000  metroNIDAZOLE (FLAGYL) tablet 500 mg  Status:  Discontinued     500 mg Oral Every 8 hours 02/08/16 1613 02/11/16 1500   02/02/16 1100  metroNIDAZOLE (FLAGYL) tablet 500 mg  Status:  Discontinued     500 mg Oral Every 12 hours 02/02/16 1024 02/08/16 1611   02/02/16 1100  ciprofloxacin (CIPRO) tablet 500 mg     500 mg Oral 2 times daily 02/02/16 1024     01/31/16 1400  fluconazole (DIFLUCAN) tablet 200 mg     200 mg Oral  Once 01/31/16 1204     01/31/16 0000  imipenem-cilastatin (PRIMAXIN) 500 mg in sodium chloride 0.9 % 100 mL IVPB  Status:  Discontinued     500 mg 200 mL/hr over 30 Minutes Intravenous Every 6 hours 01/30/16 1840 01/31/16 0957   01/30/16 1900  imipenem-cilastatin (PRIMAXIN) 500 mg in sodium chloride 0.9 % 100 mL IVPB     500 mg 200 mL/hr over 30 Minutes Intravenous STAT 01/30/16 1840 01/30/16 1956   01/29/16 2200  metroNIDAZOLE (FLAGYL) tablet 500 mg  Status:  Discontinued     500 mg Oral Every 8 hours 01/29/16 1700 01/30/16 1829   01/29/16 2000  ciprofloxacin (CIPRO) tablet 500 mg  Status:  Discontinued     500 mg Oral 2 times daily 01/29/16 1700 01/30/16 1829   01/22/16 0900  piperacillin-tazobactam (ZOSYN) IVPB 3.375 g  Status:  Discontinued     3.375 g 12.5 mL/hr over 240 Minutes Intravenous Every 8 hours 01/22/16 0840 01/29/16 1700   01/21/16 1830  ciprofloxacin (CIPRO) IVPB 400 mg  Status:  Discontinued     400 mg 200 mL/hr over 60 Minutes Intravenous Every 12 hours 01/21/16 1744 01/22/16 0840   01/21/16 1830  metroNIDAZOLE (FLAGYL) IVPB 500 mg  Status:  Discontinued     500 mg 100 mL/hr over 60 Minutes Intravenous Every 6 hours 01/21/16 1744 01/22/16 0840       Assessment/Plan Crohns  - taperingprednisone per GI, currently at 75m - Remicade per GI if continuing S/p I&D of horseshoe  abscess9/26/17  - continue penrose, sitz baths and good hygiene  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 MHassell Done- POD9 - tolerating full liquids - ileostomy functioning  ABL anemia -hg 9.3, stable Chronic pain Anxiety   ID - cipro 10/7>>10/25, tinidazole 10/16>>10/25 FEN - full liquids VTE - heparin, SCDs  Plan - tolerating small amounts of full liquids; plans to try Ensure today. Ostomy functioning. Continue same diet, mobilization. Continue local wound care as before. Patient has been afebrile and  with normal WBC x3 days, will discontinue antibiotics today. Would like for patient to be tolerating 800 calories/day prior to discharge.   LOS: 52 days    Jerrye Beavers , [facility] Surgery 02/20/2016, 8:29 AM Pager: [phone] Consults: [phone] Mon-Fri 7:00 am-4:30 pm Sat-Sun 7:00 am-11:30 am  I have seen and examined this patient and agree with the assessment and plan of APP note.   Tolerating slightly more liquids, encouraged ensure, ostomy working

## 2016-02-20 NOTE — Progress Notes (Signed)
Progress Note   Subjective  Chief Complaint:Crohn's colitis complicated by perirectal abscess, status post end ileostomy due to complications  Patient found laying in bed this morning, she tells me that she drank 1 cup of juice yesterday and is going to try and ensure shake today. She has continued with some nausea but has no further vomiting. She continues to pass some liquid stool and gas in her ostomy bag. She is in fairly good spirits this morning.    Objective   Vital signs in last 24 hours: Temp:  [97.7 F (36.5 C)-98.6 F (37 C)] 98.6 F (37 C) (10/25 0500) Pulse Rate:  [67-100] 73 (10/25 0500) Resp:  [18-20] 20 (10/25 0500) BP: (131-150)/(75-78) 142/76 (10/25 0500) SpO2:  [96 %-99 %] 99 % (10/25 0500) Last BM Date: 02/18/16 General: Caucasian female in NAD Heart:  Regular rate and rhythm; no murmurs Lungs: Respirations even and unlabored, lungs CTA bilaterally Abdomen:  Soft, mild generalized tenderness and nondistended. Normal bowel sounds. Ostomy in right lower quadrant with stool and gas Extremities:  Without edema. Neurologic:  Alert and oriented,  grossly normal neurologically. Psych:  Cooperative. Normal mood and affect.  Intake/Output from previous day: 10/24 0701 - 10/25 0700 In: 893.3 [I.V.:893.3] Out: 5 [Stool:5]  Lab Results:  Recent Labs  02/18/16 0524 02/19/16 0550  WBC 9.6 7.5  HGB 9.6* 9.3*  HCT 30.5* 29.1*  PLT 224 227   BMET  Recent Labs  02/18/16 0524 02/19/16 0550 02/20/16 0335  NA 135 136 137  K 3.5 3.4* 3.6  CL 104 104 106  CO2 25 27 26   GLUCOSE 83 88 93  BUN 5* <5* <5*  CREATININE 0.61 0.66 0.57  CALCIUM 8.3* 8.5* 8.5*   LFT No results for input(s): PROT, ALBUMIN, AST, ALT, ALKPHOS, BILITOT, BILIDIR, IBILI in the last 72 hours. PT/INR No results for input(s): LABPROT, INR in the last 72 hours.  Studies/Results: Dg Abd 1 View  Result Date: 02/19/2016 CLINICAL DATA:  02/18/2016. EXAM: ABDOMEN - 1 VIEW COMPARISON:   None. FINDINGS: The bowel gas pattern is normal. No radio-opaque calculi or other significant radiographic abnormality are seen. Persistent dilated loops of small bowel again noted consistent with small bowel obstruction. Ostomy is noted in right lower quadrant. No free air. No acute bony abnormality . IMPRESSION: Persistent unchanged dilated loops of small bowel consistent small bowel obstruction. Ostomy noted in the right lower quadrant. Electronically Signed   By: Marcello Moores  Register   On: 02/19/2016 07:14   Dg Abd 2 Views  Result Date: 02/20/2016 CLINICAL DATA:  Ileus EXAM: ABDOMEN - 2 VIEW COMPARISON:  Multiple recent previous exams. FINDINGS: Upright film shows no evidence for intraperitoneal free air. Diffuse gaseous small bowel distention persists, but comparing back over multiple studies, while the degree of distension is not substantially changed since yesterday, it is decreased since 02/17/2016. IMPRESSION: No substantial change in diffuse gaseous bowel distention since yesterday, but overall appearance is improved when comparing back to 02/17/2016. Electronically Signed   By: Misty Stanley M.D.   On: 02/20/2016 09:33       Assessment / Plan:    Assessment: 1.  75 y/o female with colitis complicated by perirectal abscess / fistula, suggestive of Crohns disease, who has had a prolonged hospitalization. She did not go into remission with IV steroids, placed on Remicade, developed perirectal abscess leading to surgery. Ultimately got diverting ileostomy and she had been recovering well, course most recentlycomplicated by ileus / SBO at the  ostomy for the past 6 days. Patient was able to tolerate a small glass of juice yesterday, producing gas and liquid stool in her ostomy bag, plans are for ensure shakes today.  Plan: 1. Prednisone continuing to be tapered, will discuss timing of next decreased with Dr. Loletha Carrow 2. Next dose of Remicade has been held for now while she is healing from  surgery 3. Will get Dr. Vena Rua recommendations on antibiotic timing today 4. Please await further recs from Dr. Loletha Carrow  We will continue to follow.   LOS: 43 days   Sara Barnes  02/20/2016, 10:36 AM  Pager # [phone]  I have discussed the case with the PA, and that is the plan I formulated. I personally interviewed and examined the patient.  SBO seems to be slowly opening up. Abd pain decreased today. No rectal bleeding  We will keep this dose of prednisone until this weekend, then decrease to 15 mg for about 5 days, then 10 mg for 5 days, then 5 mg for 5 days, then stop.    Sara Barnes Pager [phone]  Mon-Fri 8a-5p [phone] after 5p, weekends, holidays

## 2016-02-20 NOTE — Progress Notes (Signed)
PROGRESS NOTE    Sara Barnes  QZR:[id number] DOB: 1940/06/08 DOA: 01/07/2016 PCP: Ann Held, DO   Brief Narrative: Sara Barnes a 75 y.o.femalewith medical history significant of ulcerative colitis versus Crohn's disease, believed to be UC eventually,otherwise healthy, presents to the emergency room with chief complaint of severe abdominal pain for the past several days. She was recently hospitalized and discharged home about 9 days prior to admission. At home, she has had ongoing symptoms, never really well achieved full remission, and in few days she felt extremely unwell. She has crampy abdominal pain, nausea, poor appetite, barely able to keep down some Gatorade, and has been having significant amounts of bloody bowel movements as well.  In the emergency room her vital signs are stable, her blood work is essentially unremarkable. Gastroenterology was consulted by EDP and have evaluated patient, and TRH was asked for admission.   Interim history Gastroenterology consultation evaluated the patient. She underwent flexible sigmoidoscopy. CMV was negative and she was started on Remicade. She was initially doing better and tolerating some diet and since still having a lot of pain and frequent Bms she was dosed with another Remicaide infusion on 9/17. On 9/21 pain and bloody clots worsened overnight. CT abd/pelvis showed perirectal abscess- General surgery was consulted. Patient underwent I&D on 9/26. Steroids were decreased per GI recommendations. Patient received dose of amikacin on 01/28/2016. Clinically continuing to improve. Patient is working with physical therapy  UC vs crohn's flare, perirectal abscess s/p drain, S/p remicade, Currently on tapering dose of prednisone.  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 Started on clear liquid diet 10/17. Patient again developed ileus. Tolerating clear liquid diet. Diet has been advanced by  surgery team.   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 CMV was negative. Patient was given Remicade 01/09/2016, 01/13/2016, 01/28/2016. Patient status post diverting end ileostomy 02/11/2016. -Treated with ciprofloxacin and as per GI. The antibiotics discontinued on October 25. -Currently on IV fluid and advancing diet as tolerated. -GI and general surgery's evaluation and management ongoing. We will follow up recommended plan.  #Acute blood loss anemia status post 4 units of red blood cell transfusion. No further bleeding and hemoglobin is around baseline.  #Ileus/small bowel obstruction at ostomy site: Abdomen x-ray result reviewed. Surgery consult appreciated. Tolerating clear liquid diet and now advancing diet as tolerated. Continue conservative and supportive care.  #Hypokalemia: Potassium level acceptable. Continue to monitor. Start oral potassium.  #Pain management: Patient is currently on MS Contin twice a day for long-acting associated with immediate release morphine as needed for better pain control. IV morphine as needed for severe breakthrough pain. Patient is on a stool softener as well.  DVT prophylaxis: Heparin subcutaneous Code Status: Full code Family Communication: Patient's sister and son at bedside Disposition Plan: Likely discharge to SNF in 1-2 days depending on GI and general surgery's plan.   Consultants:   Gen. surgery and gastroenterology  Procedures: Multiple procedure as above including sigmoidoscopy, I&D and diverting end ileostomy Antimicrobials: No antibiotics today  Subjective: Patient was seen and examined at bedside patient reported doing well. Denied nausea vomiting or abdominal pain. She reported tolerating clear liquid diet. Reported that her pain is better controlled. Denied headache,  chest pain, shortness of breath.   Objective: Vitals:   02/19/16 1500 02/19/16 2242 02/20/16 0500 02/20/16 1254  BP: 131/75 (!) 150/78 (!) 142/76 (!) 104/53  Pulse: 100 67 73 94  Resp: 18 18 20 18   Temp: 97.9 F (36.6 C) 97.7 F (36.5 C) 98.6 F (37 C) 98.3 F (36.8 C)  TempSrc: Oral Oral Oral Oral  SpO2: 96% 98% 99% 97%  Weight:      Height:        Intake/Output Summary (Last 24 hours) at 02/20/16 1523 Last data filed at 02/20/16 0240  Gross per 24 hour  Intake                0 ml  Output                5 ml  Net               -5 ml   Filed Weights   01/07/16 1136 02/08/16 1416  Weight: 72.6 kg (160 lb) 67.3 kg (148 lb 4.8 oz)    Examination:  General exam: Appears calm and comfortable  Respiratory system: Clear to auscultation. Respiratory effort normal. Cardiovascular system: S1 & S2 heard, RRR. No pedal edema. Gastrointestinal system: Abdomen is nondistended, soft and nontender.end ileostomy site clean. Normal bowel sounds heard. Central nervous system: Alert and oriented. No focal neurological deficits. Extremities: Symmetric 5 x 5 power. Skin: No rashes, lesions or ulcers Psychiatry: Judgement and insight appear normal. Mood & affect appropriate.     Data Reviewed: I have personally reviewed following labs and imaging studies  CBC:  Recent Labs Lab 02/15/16 0550 02/16/16 0554 02/17/16 0653 02/18/16 0524 02/19/16 0550  WBC 17.6* 16.0* 9.6 9.6 7.5  NEUTROABS  --   --  6.2  --   --   HGB 12.7 12.0 9.4* 9.6* 9.3*  HCT 38.9 38.0 29.1* 30.5* 29.1*  MCV 89.6 92.5 93.6 93.8 90.7  PLT 383 383 248 224 335   Basic Metabolic Panel:  Recent Labs Lab 02/16/16 0554 02/17/16 0653 02/18/16 0524 02/19/16 0550 02/20/16 0335  NA 136 135 135 136 137  K 4.2 4.0 3.5 3.4* 3.6  CL 100* 102 104 104 106  CO2 26 25 25 27 26   GLUCOSE 88 77 83 88 93  BUN 10 8 5* <5* <5*  CREATININE 0.68 0.72 0.61 0.66 0.57  CALCIUM 9.0 8.6* 8.3* 8.5* 8.5*  MG 2.2 1.9 1.8 1.7  2.2   GFR: Estimated Creatinine Clearance: 57.8 mL/min (by C-G formula based on SCr of 0.57 mg/dL). Liver Function Tests: No results for input(s): AST, ALT, ALKPHOS, BILITOT, PROT, ALBUMIN in the last 168 hours. No results for input(s): LIPASE, AMYLASE in the last 168 hours. No results for input(s): AMMONIA in the last 168 hours. Coagulation Profile: No results for input(s): INR, PROTIME in the last 168 hours. Cardiac Enzymes: No results for input(s): CKTOTAL, CKMB, CKMBINDEX, TROPONINI in the last 168 hours. BNP (last 3 results) No results for input(s): PROBNP in the last 8760 hours. HbA1C: No results for input(s): HGBA1C in the last 72 hours. CBG:  Recent Labs Lab 02/17/16 1357  GLUCAP 87   Lipid Profile: No results for input(s): CHOL, HDL, LDLCALC, TRIG, CHOLHDL, LDLDIRECT in the last 72 hours. Thyroid Function Tests: No results for input(s): TSH, T4TOTAL, FREET4, T3FREE, THYROIDAB in the last 72 hours. Anemia Panel: No results for input(s): VITAMINB12, FOLATE, FERRITIN, TIBC, IRON, RETICCTPCT in the last 72 hours. Sepsis Labs: No results for input(s): PROCALCITON, LATICACIDVEN in the last 168 hours.  No results found for this or any previous visit (from the  past 240 hour(s)).       Radiology Studies: Dg Abd 1 View  Result Date: 02/19/2016 CLINICAL DATA:  02/18/2016. EXAM: ABDOMEN - 1 VIEW COMPARISON:  None. FINDINGS: The bowel gas pattern is normal. No radio-opaque calculi or other significant radiographic abnormality are seen. Persistent dilated loops of small bowel again noted consistent with small bowel obstruction. Ostomy is noted in right lower quadrant. No free air. No acute bony abnormality . IMPRESSION: Persistent unchanged dilated loops of small bowel consistent small bowel obstruction. Ostomy noted in the right lower quadrant. Electronically Signed   By: Marcello Moores  Register   On: 02/19/2016 07:14   Dg Abd 2 Views  Result Date: 02/20/2016 CLINICAL DATA:  Ileus  EXAM: ABDOMEN - 2 VIEW COMPARISON:  Multiple recent previous exams. FINDINGS: Upright film shows no evidence for intraperitoneal free air. Diffuse gaseous small bowel distention persists, but comparing back over multiple studies, while the degree of distension is not substantially changed since yesterday, it is decreased since 02/17/2016. IMPRESSION: No substantial change in diffuse gaseous bowel distention since yesterday, but overall appearance is improved when comparing back to 02/17/2016. Electronically Signed   By: Misty Stanley M.D.   On: 02/20/2016 09:33        Scheduled Meds: . dicyclomine  10 mg Oral TID AC & HS  . famotidine  20 mg Oral BID  . feeding supplement (ENSURE ENLIVE)  237 mL Oral Q24H  . FLUoxetine  40 mg Oral Daily  . heparin  5,000 Units Subcutaneous Q8H  . hydrocortisone  25 mg Rectal Daily  . mesalamine  1,000 mg Rectal QHS  . midazolam  2 mg Intravenous Once  . morphine  30 mg Oral Q12H  . predniSONE  20 mg Oral QAC breakfast  . senna-docusate  2 tablet Oral BID   Continuous Infusions: . dextrose 5 % and 0.9% NaCl 75 mL/hr at 02/19/16 1435     LOS: 43 days    Time spent: 31 minutes.    Dron Tanna Furry, MD [facility] Pager [phone]  If 7PM-7AM, please contact night-coverage [email or web address] Password [email or web address] 02/20/2016, 3:23 PM

## 2016-02-20 NOTE — Progress Notes (Signed)
Occupational Therapy Treatment Patient Details Name: Sara Barnes MRN: [id number] DOB: Sep 11, 1940 Today's Date: 02/20/2016    History of present illness 75 yo female s/p end ileostomy 10/17   OT comments  Pt making small improvements despite having SBO in last few days.  Pt goals updated.  Sister assisting pt in room today.  Spoke to sister about making sure the pt is doing some activities for herself to build her strength back up to eventually return home.  Pt continues to require min guard with most adls on her feet and supervision when sitting.    Follow Up Recommendations  SNF;Supervision/Assistance - 24 hour    Equipment Recommendations  None recommended by OT    Recommendations for Other Services      Precautions / Restrictions Precautions Precautions: Fall Precaution Comments: has fallen x 2 in hospital, pt denies prior falls at home; rectal drain Restrictions Weight Bearing Restrictions: No       Mobility Bed Mobility               General bed mobility comments: pt in bathroom on arrival.  Transfers Overall transfer level: Needs assistance Equipment used: None Transfers: Sit to/from Stand Sit to Stand: Min guard         General transfer comment: guarding only there for safety.    Balance Overall balance assessment: Needs assistance Sitting-balance support: Feet supported Sitting balance-Leahy Scale: Good     Standing balance support: Single extremity supported;During functional activity Standing balance-Leahy Scale: Fair Standing balance comment: Pt can let go of grab rail or walker for short moments during adls without a loss of balance but overall requires the use of an assitive device when on her feet.                   ADL Overall ADL's : Needs assistance/impaired Eating/Feeding: Independent;Sitting   Grooming: Oral care;Wash/dry face;Wash/dry hands;Supervision/safety;Standing   Upper Body Bathing: Supervision/ safety;Sitting    Lower Body Bathing: Sit to/from stand;Maximal assistance Lower Body Bathing Details (indicate cue type and reason): min assist to reach feet. Upper Body Dressing : Set up;Sitting Upper Body Dressing Details (indicate cue type and reason): gown Lower Body Dressing: Moderate assistance;Sit to/from stand;Cueing for compensatory techniques Lower Body Dressing Details (indicate cue type and reason): pt struggles to reach feet and has soreness in rectal area.  Will benefit from adatptive equipment if open to using it. Pt is accustomed to having things done for her so encouraged her to try on her own. Toilet Transfer: Min guard;Comfort height toilet;Ambulation;RW Toilet Transfer Details (indicate cue type and reason): walked to bathroom with min guard. Toileting- Clothing Manipulation and Hygiene: Minimal assistance;Sit to/from stand;Cueing for compensatory techniques Toileting - Clothing Manipulation Details (indicate cue type and reason): min assist when clothes drop all the way to floor in sitting.     Functional mobility during ADLs: Min guard;Cueing for safety;Cueing for sequencing General ADL Comments: Sister was helping pt on arrival.  Spoke with her about letting pt do more for herself if able.      Vision                     Perception     Praxis      Cognition   Behavior During Therapy: [facility] for tasks assessed/performed Overall Cognitive Status: Within Functional Limits for tasks assessed                  General Comments: Pt bathing with  family member in bathroom.  Pt really wanted family member to continue with bath.  Encourged pt to do as much for herself as possible and explained to family member how much it would help for pt to assist with this bath and toileting.    Extremity/Trunk Assessment               Exercises     Shoulder Instructions       General Comments      Pertinent Vitals/ Pain       Pain Assessment: Faces Faces Pain Scale:  Hurts little more Pain Location: abdomen and rectum Pain Descriptors / Indicators: Aching;Sore Pain Intervention(s): Limited activity within patient's tolerance;Monitored during session;Repositioned  Home Living                                          Prior Functioning/Environment              Frequency  Min 2X/week        Progress Toward Goals  OT Goals(current goals can now be found in the care plan section)  Progress towards OT goals: Progressing toward goals  Acute Rehab OT Goals Patient Stated Goal: regain PLOF/independence OT Goal Formulation: With patient Time For Goal Achievement: 03/05/16 Potential to Achieve Goals: Good ADL Goals Pt Will Perform Grooming: with supervision;standing Pt Will Perform Lower Body Bathing: with supervision;sit to/from stand Pt Will Perform Lower Body Dressing: with supervision;sit to/from stand Pt Will Transfer to Toilet: with supervision;ambulating;bedside commode Pt Will Perform Toileting - Clothing Manipulation and hygiene: with supervision;sit to/from stand Pt Will Perform Tub/Shower Transfer: Shower transfer;ambulating;rolling walker;3 in 1;with supervision Additional ADL Goal #1: Pt will recall and generalize education from one session to the next with min verbal cues.  Plan Discharge plan remains appropriate    Co-evaluation                 End of Session Equipment Utilized During Treatment: Rolling walker   Activity Tolerance Patient limited by pain   Patient Left Other (comment);with call bell/phone within reach (in bathroom with sister)   Nurse Communication Mobility status        Time: 9470-7615 OT Time Calculation (min): 10 min  Charges: OT General Charges $OT Visit: 1 Procedure OT Treatments $Self Care/Home Management : 8-22 mins  Glenford Peers 02/20/2016, 11:37 AM  [phone]

## 2016-02-21 MED ORDER — INSULIN ASPART 100 UNIT/ML ~~LOC~~ SOLN: 0.0000 [IU] | Freq: Every day | SUBCUTANEOUS | Status: DC

## 2016-02-21 MED ORDER — ONDANSETRON HCL 4 MG/2ML IJ SOLN
4.0000 mg | Freq: Three times a day (TID) | INTRAMUSCULAR | Status: DC
  Administered 2016-02-21 – 2016-02-29 (×21): 4 mg via INTRAVENOUS
  Filled 2016-02-21 (×20): qty 2

## 2016-02-21 MED ORDER — VITAL AF 1.2 CAL PO LIQD
1000.0000 mL | ORAL | Status: DC
  Filled 2016-02-21: qty 1000

## 2016-02-21 MED ORDER — JEVITY 1.2 CAL PO LIQD: 1000.0000 mL | ORAL | Status: DC

## 2016-02-21 MED ORDER — ONDANSETRON 4 MG PO TBDP
4.0000 mg | ORAL_TABLET | Freq: Three times a day (TID) | ORAL | Status: DC
  Administered 2016-02-23 – 2016-02-24 (×2): 4 mg via ORAL
  Filled 2016-02-21 (×5): qty 1

## 2016-02-21 MED ORDER — PRO-STAT SUGAR FREE PO LIQD
30.0000 mL | Freq: Three times a day (TID) | ORAL | Status: DC
  Administered 2016-02-21 – 2016-02-22 (×3): 30 mL via ORAL
  Filled 2016-02-21 (×3): qty 30

## 2016-02-21 MED ORDER — SIMETHICONE 80 MG PO CHEW
160.0000 mg | CHEWABLE_TABLET | Freq: Three times a day (TID) | ORAL | Status: DC
  Administered 2016-02-21 – 2016-02-22 (×4): 160 mg via ORAL
  Administered 2016-02-24 (×2): 80 mg via ORAL
  Administered 2016-02-25 – 2016-02-29 (×9): 160 mg via ORAL
  Filled 2016-02-21 (×16): qty 2

## 2016-02-21 MED ORDER — INSULIN ASPART 100 UNIT/ML ~~LOC~~ SOLN
0.0000 [IU] | Freq: Three times a day (TID) | SUBCUTANEOUS | Status: DC
  Administered 2016-02-22: 1 [IU] via SUBCUTANEOUS

## 2016-02-21 NOTE — Progress Notes (Signed)
Nutrition Follow-up  DOCUMENTATION CODES:   Not applicable  INTERVENTION:  After consideration of TPN vs tube feeding, team has decided to insert NG tube and initiate tube feeding.  Once NG tube placed and confirmed, recommend initiating Vital AF 1.2 @ 15 ml/hr. Can advance by 15 ml/hr Q8hrs to goal of Vital AF 1.2 @ 60 ml/hr. Recommend minimum free water flushes of 30 ml Q4hrs as patient receiving IV fluids (180 ml). Goal regimen provides 1728 kcal, 108 grams protein, 1346 ml H2O with recommended FWF.  If patient does not tolerate feedings through NG tube, can advance post-pyloric.  Once patient tolerating goal rate, can consider cycling overnight to allow time off from TF during the day to increase PO intake.  Continue diet advancement per Surgery.  Continue Ensure Enlive po once daily, each supplement provides 350 kcal and 20 grams of protein.  NUTRITION DIAGNOSIS:   Increased nutrient needs related to catabolic illness as evidenced by estimated needs.  Ongoing.  GOAL:   Patient will meet greater than or equal to 90% of their needs  Not met.  MONITOR:   PO intake, Supplement acceptance, Weight trends, Labs, I & O's  REASON FOR ASSESSMENT:   Consult New TPN/TNA (TPN prior to diverting ileostomy next week and to improve albumin)  ASSESSMENT:   75 y.o.femalewith medical history significant of ulcerative colitis versus Crohn's disease, believed to be UC eventually,otherwise healthy, presents to the emergency room with chief complaint of severe abdominal pain for the past several days PTA.  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 -Also s/p diverting ileostomy on 10/16 (POD8).  -Patient was made NPO again on 10/21 due to ileus. Was advanced to CLD 10/23. -Advanced to Full Liquid Diet on 10/24. -Patient still with minimal output in ileostomy (expected with poor PO intake).   Patient reports she had very poor intake yesterday. She only had 1/2 cup broth, 1/2 cup  juice, 1 Ensure, and bites of grits. She reports large episode of emesis early this morning, but it was before she ate. She reports that she is not currently having nausea or abdominal pain.  Attempted to encourage patient to try to meet needs through PO intake of oral nutrition supplements as liquids may be better tolerated than attempting to eat solid foods. Patient reports that she does not think she can drink any more than she had yesterday.  In the past 24 hours patient has had approximately 430 kcal (26% minimum estimated kcal needs) and 20 grams protein (21% minimum estimated protein needs) through PO intake.  Medications reviewed and include: famotidine, Novolog sliding scale TID with meals and daily at bedtime, potassium chloride 20 mEq daily, prednisone 20 mg daily before breakfast, senna, D5-NS @ 75 ml/hr (90 grams dextrose, 306 kcal daily).  Labs reviewed: BUN <5; Magnesium and Potassium WNL today.   Access: PICC Single Lumen placed 10/24  Discussed plan with RN.   Diet Order:  Diet full liquid Room service appropriate? Yes; Fluid consistency: Thin  Skin:  Wound (see comment) (MSAD to perineum, buttocks; closed incision rectum)  Last BM:  02/20/2016  Height:   Ht Readings from Last 1 Encounters:  01/07/16 _0  (1.676 m)    Weight:   Wt Readings from Last 1 Encounters:  02/08/16 148 lb 4.8 oz (67.3 kg)    Ideal Body Weight:  59.09 kg  BMI:  Body mass index is 23.94 kg/m.  Estimated Nutritional Needs:   Kcal:  4097-3532 (MSJ x 1.4-1.6)  Protein:  95-110 grams (1.4-1.6 g/kg)  Fluid:  1.6-1.9 L/day  EDUCATION NEEDS:   No education needs identified at this time  Willey Blade, MS, RD, LDN Pager: [phone] After Hours Pager: [phone]

## 2016-02-21 NOTE — Progress Notes (Signed)
Patient vomited large amount of dark, green emesis. Given cool cloth, nurse notified. Will continue to monitor.

## 2016-02-21 NOTE — Progress Notes (Signed)
PROGRESS NOTE    Sara Barnes  KDT:[id number] DOB: [birth year] DOA: 01/07/2016 PCP: Ann Held, DO   Brief Narrative: [name] a 75 y.o.femalewith medical history significant of ulcerative colitis versus Crohn's disease, believed to be UC eventually,otherwise healthy, presents to the emergency room with chief complaint of severe abdominal pain for the past several days. She was recently hospitalized and discharged home about 9 days prior to admission. At home, she has had ongoing symptoms, never really well achieved full remission, and in few days she felt extremely unwell. She has crampy abdominal pain, nausea, poor appetite, barely able to keep down some Gatorade, and has been having significant amounts of bloody bowel movements as well.  In the emergency room her vital signs are stable, her blood work is essentially unremarkable. Gastroenterology was consulted by EDP and have evaluated patient, and TRH was asked for admission.   Interim history Gastroenterology consultation evaluated the patient. She underwent flexible sigmoidoscopy. CMV was negative and she was started on Remicade. She was initially doing better and tolerating some diet and since still having a lot of pain and frequent Bms she was dosed with another Remicaide infusion on 9/17. On 9/21 pain and bloody clots worsened overnight. CT abd/pelvis showed perirectal abscess- General surgery was consulted. Patient underwent I&D on 9/26. Steroids were decreased per GI recommendations. Patient received dose of amikacin on 01/28/2016. Clinically continuing to improve. Patient is working with physical therapy  UC vs crohn's flare, perirectal abscess s/p drain, S/p remicade, Currently on tapering dose of prednisone.  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 Started on clear liquid diet 10/17. Patient again developed ileus. On medical management. Patient developed vomiting today and  not able to tolerate by mouth.   Patient's care is mostly driven by general surgery and GI team. The surgery team will assume patient's care as a primary team. I discussed with the general surgery, Izora Gala today. We will not follow up the patient unless requested by the surgery team.   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 CMV was negative. Patient was given Remicade 01/09/2016, 01/13/2016, 01/28/2016. Patient status post diverting end ileostomy 02/11/2016. -Treated with ciprofloxacin and tinidazole and as per GI. The antibiotics discontinued on October 25. -Patient is on tapering dose of prednisone, currently on 20 mg daily. -Currently on IV fluid and supportive care. Patient is not able to tolerate diet and had vomiting today. Further intervention/management as per surgery and GI team.  #Acute blood loss anemia status post 4 units of red blood cell transfusion during this hospitalization. No further bleeding and hemoglobin is around baseline.  #Ileus/small bowel obstruction at ostomy site:  Further management and advancement of the diet as per surgery team.  #Hypokalemia: Potassium level acceptable. Continue to monitor. Continue potassium supplement and monitor labs.  #Pain management: Patient is currently on MS Contin twice a day for long-acting associated with immediate release morphine as needed for better pain control. IV morphine as needed for severe breakthrough pain. Patient is on a stool softener as well. Patient was educated regarding the importance of tapering down on narcotics.  DVT prophylaxis: Heparin subcutaneous Code Status: Full code Family Communication: Patient's sister at bedside Disposition Plan: Likely discharge to SNF in 1-2 days depending on GI and general surgery's plan.   Consultants:   Gen.  surgery and  gastroenterology  Procedures: Multiple procedure as above including sigmoidoscopy, I&D and diverting end ileostomy Antimicrobials: No antibiotics today  Subjective: Patient was seen and examined at bedside. Patient reported not doing well. She reported having few episodes of vomiting this morning. She was not able to tolerate diet. Denied abdominal pain, chest pain, shortness of breath. No chills, headache or dizziness. Patient's sister at bedside.  Objective: Vitals:   02/20/16 2100 02/21/16 0526 02/21/16 1432 02/21/16 1529  BP: 136/73 (!) 152/60  130/78  Pulse: 76 87  85  Resp: 18 18  18   Temp: 98 F (36.7 C) 97.7 F (36.5 C)  98.6 F (37 C)  TempSrc: Oral Oral  Oral  SpO2: 98% 98%  97%  Weight:   63.1 kg (139 lb 1.6 oz)   Height:        Intake/Output Summary (Last 24 hours) at 02/21/16 1545 Last data filed at 02/21/16 0527  Gross per 24 hour  Intake              736 ml  Output                0 ml  Net              736 ml   Filed Weights   01/07/16 1136 02/08/16 1416 02/21/16 1432  Weight: 72.6 kg (160 lb) 67.3 kg (148 lb 4.8 oz) 63.1 kg (139 lb 1.6 oz)    Examination:  General exam: Not in distress, lying on bed comfortably  Respiratory system: Clear to auscultation bilateral, no wheeze or crackle. Cardiovascular system: Regular rate and rhythm, S1-S2 normal. No pedal edema. Gastrointestinal system: Abdomen is soft, nontender and nondistended. End ileostomy site is clean and has stool in the bag. Central nervous system: Alert, awake and oriented. No focal neurological deficit. Extremities: Symmetric 5 x 5 power. Skin: No rashes, lesions or ulcers Psychiatry: Judgement and insight appear normal. Mood & affect appropriate.     Data Reviewed: I have personally reviewed following labs and imaging studies  CBC:  Recent Labs Lab 02/15/16 0550 02/16/16 0554 02/17/16 0653 02/18/16 0524 02/19/16 0550  WBC 17.6* 16.0* 9.6 9.6 7.5  NEUTROABS  --   --   6.2  --   --   HGB 12.7 12.0 9.4* 9.6* 9.3*  HCT 38.9 38.0 29.1* 30.5* 29.1*  MCV 89.6 92.5 93.6 93.8 90.7  PLT 383 383 248 224 562   Basic Metabolic Panel:  Recent Labs Lab 02/16/16 0554 02/17/16 0653 02/18/16 0524 02/19/16 0550 02/20/16 0335  NA 136 135 135 136 137  K 4.2 4.0 3.5 3.4* 3.6  CL 100* 102 104 104 106  CO2 26 25 25 27 26   GLUCOSE 88 77 83 88 93  BUN 10 8 5* <5* <5*  CREATININE 0.68 0.72 0.61 0.66 0.57  CALCIUM 9.0 8.6* 8.3* 8.5* 8.5*  MG 2.2 1.9 1.8 1.7 2.2   GFR: Estimated Creatinine Clearance: 57.8 mL/min (by C-G formula based on SCr of 0.57 mg/dL). Liver Function Tests: No results for input(s): AST, ALT, ALKPHOS, BILITOT, PROT, ALBUMIN in the last 168 hours. No results for input(s): LIPASE, AMYLASE in the last 168 hours. No results for input(s): AMMONIA in the last 168 hours. Coagulation Profile: No results for input(s): INR, PROTIME in the last 168 hours. Cardiac Enzymes: No results for input(s): CKTOTAL, CKMB, CKMBINDEX, TROPONINI in the last 168 hours. BNP (last 3 results) No results for input(s): PROBNP in the last 8760 hours. HbA1C: No results for  input(s): HGBA1C in the last 72 hours. CBG:  Recent Labs Lab 02/17/16 1357  GLUCAP 87   Lipid Profile: No results for input(s): CHOL, HDL, LDLCALC, TRIG, CHOLHDL, LDLDIRECT in the last 72 hours. Thyroid Function Tests: No results for input(s): TSH, T4TOTAL, FREET4, T3FREE, THYROIDAB in the last 72 hours. Anemia Panel: No results for input(s): VITAMINB12, FOLATE, FERRITIN, TIBC, IRON, RETICCTPCT in the last 72 hours. Sepsis Labs: No results for input(s): PROCALCITON, LATICACIDVEN in the last 168 hours.  No results found for this or any previous visit (from the past 240 hour(s)).       Radiology Studies: Dg Abd 2 Views  Result Date: 02/20/2016 CLINICAL DATA:  Ileus EXAM: ABDOMEN - 2 VIEW COMPARISON:  Multiple recent previous exams. FINDINGS: Upright film shows no evidence for  intraperitoneal free air. Diffuse gaseous small bowel distention persists, but comparing back over multiple studies, while the degree of distension is not substantially changed since yesterday, it is decreased since 02/17/2016. IMPRESSION: No substantial change in diffuse gaseous bowel distention since yesterday, but overall appearance is improved when comparing back to 02/17/2016. Electronically Signed   By: Misty Stanley M.D.   On: 02/20/2016 09:33        Scheduled Meds: . dicyclomine  10 mg Oral TID AC & HS  . famotidine  20 mg Oral BID  . feeding supplement (ENSURE ENLIVE)  237 mL Oral Q24H  . feeding supplement (PRO-STAT SUGAR FREE 64)  30 mL Oral TID  . FLUoxetine  40 mg Oral Daily  . heparin  5,000 Units Subcutaneous Q8H  . hydrocortisone  25 mg Rectal Daily  . insulin aspart  0-5 Units Subcutaneous QHS  . [START ON 02/22/2016] insulin aspart  0-9 Units Subcutaneous TID WC  . mesalamine  1,000 mg Rectal QHS  . midazolam  2 mg Intravenous Once  . morphine  30 mg Oral Q12H  . ondansetron  4 mg Oral Q8H   Or  . ondansetron (ZOFRAN) IV  4 mg Intravenous Q8H  . potassium chloride SA  20 mEq Oral Daily  . predniSONE  20 mg Oral QAC breakfast  . senna-docusate  2 tablet Oral BID  . simethicone  160 mg Oral TID WC   Continuous Infusions: . dextrose 5 % and 0.9% NaCl 75 mL/hr at 02/21/16 0527  . feeding supplement (VITAL AF 1.2 CAL)       LOS: 44 days    Time spent: 25 minutes.    Mattie Nordell Tanna Furry, MD [facility] Pager [phone]  If 7PM-7AM, please contact night-coverage [email or web address] Password [email or web address] 02/21/2016, 3:45 PM

## 2016-02-21 NOTE — Progress Notes (Signed)
Nutrition Brief Note  Patient has decided she does not want to proceed with NG tube and tube feeding at this time. Plan will be to attempt to meet needs through PO intake of oral nutrition supplements.   Recommend Ensure Enlive po once daily, each supplement provides 350 kcal and 20 grams protein.  Recommend Magic cup TID, each supplement provides 290 kcal and 9 grams of protein.  Will mix Pro-Stat 30 ml into each Magic Cup. Pro-Stat provides 100 kcal and 15 grams of protein per supplement.   All together, would provide 1520 kcal (91% minimum estimated kcal needs) and 92 grams protein (97% minimum estimated protein needs).   Able to collect standing weight on patient. She now weighs 139.1 lbs. She has lost 20.9 lbs (13% body weight) over one month.   Patient now meets criteria for severe acute malnutrition in setting of 13% weight loss over one month, intake </=50% of estimated energy requirement for >/= 5 days.

## 2016-02-21 NOTE — Progress Notes (Signed)
Patient refused to have NG tube placed for tube feeds.  Surgery and dietician notified.

## 2016-02-21 NOTE — Progress Notes (Signed)
Patient ID: Sara Barnes, female   DOB: [birth year], 75 y.o.   MRN: [id number]  [facility] Surgery Progress Note  10 Days Post-Op  Subjective: Good day yesterday but had a bad night. Single episode large emesis last night. Continues to have small amount of OP and gas from ileostomy.  Objective: Vital signs in last 24 hours: Temp:  [97.7 F (36.5 C)-98.3 F (36.8 C)] 97.7 F (36.5 C) (10/26 0526) Pulse Rate:  [76-94] 87 (10/26 0526) Resp:  [18] 18 (10/26 0526) BP: (104-152)/(53-73) 152/60 (10/26 0526) SpO2:  [97 %-98 %] 98 % (10/26 0526) Last BM Date: 02/20/16  Intake/Output from previous day: 10/25 0701 - 10/26 0700 In: 1336 [I.V.:1336] Out: -  Intake/Output this shift: No intake/output data recorded.  PE: Gen:  Alert, NAD, pleasant Pulm:  Effort normal Abd: Soft, mild distension, mild tenderness upper abdomen, +BS, incisions C/D/I, RLQ ostomy pink with liquid brown stool and air in bag  Lab Results:   Recent Labs  02/19/16 0550  WBC 7.5  HGB 9.3*  HCT 29.1*  PLT 227   BMET  Recent Labs  02/19/16 0550 02/20/16 0335  NA 136 137  K 3.4* 3.6  CL 104 106  CO2 27 26  GLUCOSE 88 93  BUN <5* <5*  CREATININE 0.66 0.57  CALCIUM 8.5* 8.5*   PT/INR No results for input(s): LABPROT, INR in the last 72 hours. CMP     Component Value Date/Time   NA 137 02/20/2016 0335   NA 138 12/06/2014 0804   K 3.6 02/20/2016 0335   K 4.1 12/06/2014 0804   CL 106 02/20/2016 0335   CO2 26 02/20/2016 0335   CO2 25 12/06/2014 0804   GLUCOSE 93 02/20/2016 0335   GLUCOSE 92 12/06/2014 0804   BUN <5 (L) 02/20/2016 0335   BUN 12.3 12/06/2014 0804   CREATININE 0.57 02/20/2016 0335   CREATININE 0.8 12/06/2014 0804   CALCIUM 8.5 (L) 02/20/2016 0335   CALCIUM 9.4 12/06/2014 0804   PROT 5.2 (L) 02/12/2016 0517   PROT 6.5 12/06/2014 0804   ALBUMIN 2.5 (L) 02/12/2016 0517   ALBUMIN 3.3 (L) 12/06/2014 0804   AST 13 (L) 02/12/2016 0517   AST 9 12/06/2014 0804   ALT 14  02/12/2016 0517   ALT 10 12/06/2014 0804   ALKPHOS 57 02/12/2016 0517   ALKPHOS 89 12/06/2014 0804   BILITOT 0.5 02/12/2016 0517   BILITOT 0.37 12/06/2014 0804   GFRNONAA >60 02/20/2016 0335   GFRAA >60 02/20/2016 0335   Lipase     Component Value Date/Time   LIPASE 31 04/15/2011 1700       Studies/Results: Dg Abd 2 Views  Result Date: 02/20/2016 CLINICAL DATA:  Ileus EXAM: ABDOMEN - 2 VIEW COMPARISON:  Multiple recent previous exams. FINDINGS: Upright film shows no evidence for intraperitoneal free air. Diffuse gaseous small bowel distention persists, but comparing back over multiple studies, while the degree of distension is not substantially changed since yesterday, it is decreased since 02/17/2016. IMPRESSION: No substantial change in diffuse gaseous bowel distention since yesterday, but overall appearance is improved when comparing back to 02/17/2016. Electronically Signed   By: Misty Stanley M.D.   On: 02/20/2016 09:33    Anti-infectives: Anti-infectives    Start     Dose/Rate Route Frequency Ordered Stop   02/11/16 1600  piperacillin-tazobactam (ZOSYN) IVPB 3.375 g     3.375 g 100 mL/hr over 30 Minutes Intravenous Every 8 hours 02/11/16 1403 02/11/16 1614   02/11/16  1000  tinidazole (TINDAMAX) tablet 500 mg  Status:  Discontinued     500 mg Oral 2 times daily 02/08/16 1124 02/20/16 0908   02/10/16 1400  neomycin (MYCIFRADIN) tablet 1,000 mg     1,000 mg Oral 3 times daily 02/08/16 1603 02/10/16 2045   02/10/16 1400  metroNIDAZOLE (FLAGYL) tablet 500 mg  Status:  Discontinued     500 mg Oral 3 times daily 02/08/16 1603 02/10/16 0802   02/08/16 2000  metroNIDAZOLE (FLAGYL) tablet 500 mg  Status:  Discontinued     500 mg Oral Every 8 hours 02/08/16 1613 02/11/16 1500   02/02/16 1100  metroNIDAZOLE (FLAGYL) tablet 500 mg  Status:  Discontinued     500 mg Oral Every 12 hours 02/02/16 1024 02/08/16 1611   02/02/16 1100  ciprofloxacin (CIPRO) tablet 500 mg  Status:   Discontinued     500 mg Oral 2 times daily 02/02/16 1024 02/20/16 0908   01/31/16 1400  fluconazole (DIFLUCAN) tablet 200 mg  Status:  Discontinued     200 mg Oral  Once 01/31/16 1204 02/20/16 0908   01/31/16 0000  imipenem-cilastatin (PRIMAXIN) 500 mg in sodium chloride 0.9 % 100 mL IVPB  Status:  Discontinued     500 mg 200 mL/hr over 30 Minutes Intravenous Every 6 hours 01/30/16 1840 01/31/16 0957   01/30/16 1900  imipenem-cilastatin (PRIMAXIN) 500 mg in sodium chloride 0.9 % 100 mL IVPB     500 mg 200 mL/hr over 30 Minutes Intravenous STAT 01/30/16 1840 01/30/16 1956   01/29/16 2200  metroNIDAZOLE (FLAGYL) tablet 500 mg  Status:  Discontinued     500 mg Oral Every 8 hours 01/29/16 1700 01/30/16 1829   01/29/16 2000  ciprofloxacin (CIPRO) tablet 500 mg  Status:  Discontinued     500 mg Oral 2 times daily 01/29/16 1700 01/30/16 1829   01/22/16 0900  piperacillin-tazobactam (ZOSYN) IVPB 3.375 g  Status:  Discontinued     3.375 g 12.5 mL/hr over 240 Minutes Intravenous Every 8 hours 01/22/16 0840 01/29/16 1700   01/21/16 1830  ciprofloxacin (CIPRO) IVPB 400 mg  Status:  Discontinued     400 mg 200 mL/hr over 60 Minutes Intravenous Every 12 hours 01/21/16 1744 01/22/16 0840   01/21/16 1830  metroNIDAZOLE (FLAGYL) IVPB 500 mg  Status:  Discontinued     500 mg 100 mL/hr over 60 Minutes Intravenous Every 6 hours 01/21/16 1744 01/22/16 0840       Assessment/Plan Crohns  - taperingprednisone per Gi: keep 47m dose of prednisone until this weekend, then decrease to 15 mg for about 5 days, then 10 mg for 5 days, then 5 mg for 5 days, then stop - Remicade per GI if continuing 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 MHassell Done- POD10 - ileostomy functioning - minimal intake yesterday: 1/2cup broth, 1/2cup juice, 1 Ensure - large episode of emesis last night  ABL anemia -hg 9.3, stable Chronic  pain Anxiety   ID - cipro 10/7>>10/25, tinidazole 10/16>>10/25 FEN - full liquids VTE - heparin, SCDs  Plan - persistent nausea and single episode of emesis several hours after eating. Patient refusing TF or TPN, agreeable to working towards better PO dietary supplementation. Will schedule zofran and simethicone TID to help control nausea. Ostomy functioning. Continue local wound care as before.    LOS: 485days    BJerrye Beavers, PHuey P. Long Medical CenterSurgery 02/21/2016,  8:23 AM Pager: [phone] Consults: [phone] Mon-Fri 7:00 am-4:30 pm Sat-Sun 7:00 am-11:30 am

## 2016-02-21 NOTE — Progress Notes (Signed)
PT Cancellation Note  Patient Details Name: Sara Barnes MRN: [id number] DOB: 20-May-1940   Cancelled Treatment:    Reason Eval/Treat Not Completed: Attempted PT tx session-pt declined to participate at this time. Will check back if schedule allows.    Weston Anna, MPT Pager: [phone]

## 2016-02-21 NOTE — Consult Note (Signed)
[place] Nurse ostomy follow up Stoma type/location: RUQ ileostomy Stomal assessment/size: Visualized through pouch, 1 and 3/8 inches round with proximal limb at top. Peristomal assessment: not seen today Treatment options for stomal/peristomal skin: skin barrier ring in place, pouch applied Tuesday, 10/24 is intact. Planned pouch change tomorrow. Output Brown soft stool Ostomy pouching: 1pc.convex pouch with skin barrier ring Education provided: Patient and sister emptied ostomy pouch and cleaned the bottom two inches with a toilet paper wick today and performed this skill well. Emotional support provided as patient struggles with decision to allow insertion of a feeding tube.  She is tearful and wishes to eat small amounts rather than endure a tube.  Her fear include aspiration pneumonia and death. Bedside RN is working with patient and physicians (and PAs) to provide patient with options, information. Enrolled patient in [place] Start Discharge program: Yes [place] nursing team will follow, and will remain available to this patient, the nursing, surgical and medical teams.   Thanks, Maudie Flakes, MSN, RN, [place], Arther Abbott  Pager# [phone]

## 2016-02-22 LAB — GLUCOSE, CAPILLARY
GLUCOSE-CAPILLARY: 80 mg/dL (ref 65–99)
GLUCOSE-CAPILLARY: 105 mg/dL — AB (ref 65–99)
GLUCOSE-CAPILLARY: 132 mg/dL — AB (ref 65–99)
GLUCOSE-CAPILLARY: 102 mg/dL — AB (ref 65–99)
Glucose-Capillary: 93 mg/dL (ref 65–99)

## 2016-02-22 LAB — COMPREHENSIVE METABOLIC PANEL
ALT: 10 U/L — AB (ref 14–54)
AST: 12 U/L — AB (ref 15–41)
Albumin: 2.3 g/dL — ABNORMAL LOW (ref 3.5–5.0)
Alkaline Phosphatase: 44 U/L (ref 38–126)
Anion gap: 7 (ref 5–15)
CHLORIDE: 101 mmol/L (ref 101–111)
CO2: 28 mmol/L (ref 22–32)
CREATININE: 0.52 mg/dL (ref 0.44–1.00)
Calcium: 8.5 mg/dL — ABNORMAL LOW (ref 8.9–10.3)
GFR calc non Af Amer: >60 mL/min (ref >60)
Glucose, Bld: 82 mg/dL (ref 65–99)
Potassium: 3.5 mmol/L (ref 3.5–5.1)
SODIUM: 136 mmol/L (ref 135–145)
Total Bilirubin: 0.2 mg/dL — ABNORMAL LOW (ref 0.3–1.2)
Total Protein: 4.6 g/dL — ABNORMAL LOW (ref 6.5–8.1)

## 2016-02-22 LAB — DIFFERENTIAL
BASOS ABS: 0 10*3/uL (ref 0.0–0.1)
Basophils Relative: 0 %
Eosinophils Absolute: 0 10*3/uL (ref 0.0–0.7)
Eosinophils Relative: 1 %
LYMPHS PCT: 24 %
Lymphs Abs: 1.9 10*3/uL (ref 0.7–4.0)
MONO ABS: 1 10*3/uL (ref 0.1–1.0)
Monocytes Relative: 12 %
NEUTROS ABS: 5 10*3/uL (ref 1.7–7.7)
Neutrophils Relative %: 63 %

## 2016-02-22 LAB — CBC
HCT: 29.1 % — ABNORMAL LOW (ref 36.0–46.0)
Hemoglobin: 9.2 g/dL — ABNORMAL LOW (ref 12.0–15.0)
MCH: 29.6 pg (ref 26.0–34.0)
MCHC: 31.6 g/dL (ref 30.0–36.0)
MCV: 93.6 fL (ref 78.0–100.0)
PLATELETS: 206 10*3/uL (ref 150–400)
RBC: 3.11 MIL/uL — AB (ref 3.87–5.11)
RDW: 15.5 % (ref 11.5–15.5)
WBC: 7.9 10*3/uL (ref 4.0–10.5)

## 2016-02-22 LAB — MAGNESIUM: MAGNESIUM: 1.8 mg/dL (ref 1.7–2.4)

## 2016-02-22 LAB — TRIGLYCERIDES: TRIGLYCERIDES: 152 mg/dL — AB (ref <150)

## 2016-02-22 LAB — PREALBUMIN: Prealbumin: 15.6 mg/dL — ABNORMAL LOW (ref 18–38)

## 2016-02-22 LAB — PHOSPHORUS: Phosphorus: 4.3 mg/dL (ref 2.5–4.6)

## 2016-02-22 MED ORDER — PRO-STAT SUGAR FREE PO LIQD
30.0000 mL | Freq: Every day | ORAL | Status: DC
  Administered 2016-02-24: 30 mL via ORAL
  Filled 2016-02-22 (×6): qty 30

## 2016-02-22 NOTE — Progress Notes (Signed)
CSW will continue to assist with discharge planning  once medically stable.

## 2016-02-22 NOTE — Consult Note (Signed)
[place] Nurse ostomy follow up Stoma type/location: RUQ ileostomy Stomal assessment/size: 1 and 1/4 to 1 and 3/8 inches round, proximal limb at top, sloughing is near complete and stoma is 90% red, budded, moist Peristomal assessment: intact, clear Treatment options for stomal/peristomal skin: skin barrier ring Output brown stool (soft) Ostomy pouching: 1pc.convex ostomy pouch with skin barrier ring Education provided: Patient and sister are feeling much more confident regarding ostomy at this time.  Can empty independently, are able to list steps of pouch change and will try with stand by assist with next change. Enrolled patient in [place] Discharge program: Yes,  Box not received at home, so call placed today to reorder. [place] nursing team will follow, and will remain available to this patient, the nursing, surgical and medical teams.  Thanks, Maudie Flakes, MSN, RN, [place], Arther Abbott  Pager# [phone]

## 2016-02-22 NOTE — Progress Notes (Signed)
Nutrition Follow-up  DOCUMENTATION CODES:   Severe malnutrition in context of acute illness/injury  INTERVENTION:  Recommended supplement regimen: -8 oz Grape Juice mixed with Pro-Stat 30 ml 5 times daily. Each grape juice provides approximately 150 kcal and each Pro-Stat provides 100 kcal and 15 grams protein. -Lactaid milk 3 times daily, each container provides 90 kcal and 8 grams protein. If patient is able to drink all of these will have 1520 kcal and 99 grams protein daily.  If patient unable to tolerate this regimen by 10/30, will need placement of NG tube and initiation of tube feeding at that time. Patient believes she will be able to tolerate this by the end of the weekend.  Advance diet as tolerated per Surgery.  The above regimen is not meant to be long-term as it is contains virtually no fat and puts patient at risk for essential fatty acid deficiency if followed >2 weeks. Encouraged patient to add sources of fat in diet as tolerated when diet advanced.  RD will continue to follow closely.  NUTRITION DIAGNOSIS:   Malnutrition (Severe) related to acute illness as evidenced by percent weight loss, energy intake < or equal to 50% for > or equal to 5 days.  Ongoing.  GOAL:   Patient will meet greater than or equal to 90% of their needs  Not met.   MONITOR:   PO intake, Supplement acceptance, Weight trends, Labs, I & O's  REASON FOR ASSESSMENT:   Consult New TPN/TNA (TPN prior to diverting ileostomy next week and to improve albumin)  ASSESSMENT:   75 y.o.femalewith medical history significant of ulcerative colitis versus Crohn's disease, believed to be UC eventually,otherwise healthy, presents to the emergency room with chief complaint of severe abdominal pain for the past several days PTA.  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 -Also s/p diverting ileostomy on 10/16 (POD8).  -Patient was made NPO again on 10/21 due to ileus. Was advanced to CLD  10/23. -Advanced to Full Liquid Diet on 10/24. -After discussion patient did not want to have tube feeding. 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  Very limited on what we can provide patient since she is only on Full Liquid diet but not able to tolerate regular milk and yogurt in setting of lactose-intolerance. She is also no longer willing to drink Ensure Enlive or any other milky type oral nutrition supplement (even though this RD has assured patient they do not have any lactose in them). She reports she is willing to drink the Pro-Stat added into Lactaid milk or grape juice, and is also willing to drink some Boost Breeze. Will need to monitor patient closely as this would be a virtually fat-free diet and she will run risk of developing essential fatty acid deficiency.   Adjusted estimated calorie and protein needs based on new weight taken yesterday and the fact that we are now >1 week out from diverting ileostomy.  Medications reviewed and include: famotidine, Novolog sliding scale TID with meals and daily at bedtime, Zofran 4 mg Q8hrs, potassium chloride 20 mEq daily, prednisone 20 mg daily before breakfast, senna.  Labs reviewed: CBG 80-105, BUN <5, Triglycerides 152.    Diet Order:  Diet full liquid Room service appropriate? Yes; Fluid consistency: Thin  Skin:  Wound (see comment) (MSAD to perineum, buttocks; closed incision rectum)  Last BM:  02/20/2016  Height:   Ht Readings from Last 1 Encounters:  01/07/16 5' 6"  (1.676 m)    Weight:   Wt Readings from Last 1 Encounters:  02/21/16 139 lb 1.6 oz (63.1 kg)    Ideal Body Weight:  59.09 kg  BMI:  Body mass index is 22.45 kg/m.  Estimated Nutritional Needs:   Kcal:  1500-1730 (MSJ x 1.3-1.5)  Protein:  88-95 grams (1.4-1.5 grams/kg)  Fluid:  1.5-1.7 L/day  EDUCATION NEEDS:   No education needs identified at this  time  Willey Blade, MS, RD, LDN Pager: [phone] After Hours Pager: [phone]

## 2016-02-22 NOTE — Care Management Important Message (Signed)
Important Message  Patient Details  Name: NADEEN SHIPMAN MRN: [id number] Date of Birth: 02/27/1941   Medicare Important Message Given:  Yes    [name] 02/22/2016, 9:01 AM

## 2016-02-22 NOTE — Progress Notes (Signed)
Patient ID: Sara Barnes, female   DOB: October 02, 1940, 75 y.o.   MRN: [id number]  [facility] Surgery Progress Note  11 Days Post-Op  Subjective: Reports having a much better night last night. Denies any n/v or abdominal pain. Tolerating diet. Good output from ostomy.  Objective: Vital signs in last 24 hours: Temp:  [97.5 F (36.4 C)-98.6 F (37 C)] 97.5 F (36.4 C) (10/27 4496) Pulse Rate:  [71-88] 88 (10/27 0614) Resp:  [16-18] 16 (10/27 0614) BP: (114-139)/(50-78) 139/51 (10/27 0614) SpO2:  [97 %] 97 % (10/27 0614) Weight:  [139 lb 1.6 oz (63.1 kg)] 139 lb 1.6 oz (63.1 kg) (10/26 1432) Last BM Date: 02/21/16  Intake/Output from previous day: 10/26 0701 - 10/27 0700 In: -  Out: 25 [Stool:25] Intake/Output this shift: No intake/output data recorded.  PE: Gen:  Alert, NAD, pleasant Pulm:  Effort normal Abd: Soft, mild distension, NT, +BS, incisions C/D/I, RLQ ostomy pink with moderate amount liquid brown stool and air in bag   Lab Results:   Recent Labs  02/22/16 0500  WBC 7.9  HGB 9.2*  HCT 29.1*  PLT 206   BMET  Recent Labs  02/20/16 0335 02/22/16 0500  NA 137 136  K 3.6 3.5  CL 106 101  CO2 26 28  GLUCOSE 93 82  BUN <5* <5*  CREATININE 0.57 0.52  CALCIUM 8.5* 8.5*   PT/INR No results for input(s): LABPROT, INR in the last 72 hours. CMP     Component Value Date/Time   NA 136 02/22/2016 0500   NA 138 12/06/2014 0804   K 3.5 02/22/2016 0500   K 4.1 12/06/2014 0804   CL 101 02/22/2016 0500   CO2 28 02/22/2016 0500   CO2 25 12/06/2014 0804   GLUCOSE 82 02/22/2016 0500   GLUCOSE 92 12/06/2014 0804   BUN <5 (L) 02/22/2016 0500   BUN 12.3 12/06/2014 0804   CREATININE 0.52 02/22/2016 0500   CREATININE 0.8 12/06/2014 0804   CALCIUM 8.5 (L) 02/22/2016 0500   CALCIUM 9.4 12/06/2014 0804   PROT 4.6 (L) 02/22/2016 0500   PROT 6.5 12/06/2014 0804   ALBUMIN 2.3 (L) 02/22/2016 0500   ALBUMIN 3.3 (L) 12/06/2014 0804   AST 12 (L) 02/22/2016 0500    AST 9 12/06/2014 0804   ALT 10 (L) 02/22/2016 0500   ALT 10 12/06/2014 0804   ALKPHOS 44 02/22/2016 0500   ALKPHOS 89 12/06/2014 0804   BILITOT 0.2 (L) 02/22/2016 0500   BILITOT 0.37 12/06/2014 0804   GFRNONAA >60 02/22/2016 0500   GFRAA >60 02/22/2016 0500   Lipase     Component Value Date/Time   LIPASE 31 04/15/2011 1700       Studies/Results: Dg Abd 2 Views  Result Date: 02/20/2016 CLINICAL DATA:  Ileus EXAM: ABDOMEN - 2 VIEW COMPARISON:  Multiple recent previous exams. FINDINGS: Upright film shows no evidence for intraperitoneal free air. Diffuse gaseous small bowel distention persists, but comparing back over multiple studies, while the degree of distension is not substantially changed since yesterday, it is decreased since 02/17/2016. IMPRESSION: No substantial change in diffuse gaseous bowel distention since yesterday, but overall appearance is improved when comparing back to 02/17/2016. Electronically Signed   By: Misty Stanley M.D.   On: 02/20/2016 09:33    Anti-infectives: Anti-infectives    Start     Dose/Rate Route Frequency Ordered Stop   02/11/16 1600  piperacillin-tazobactam (ZOSYN) IVPB 3.375 g     3.375 g 100 mL/hr over 30  Minutes Intravenous Every 8 hours 02/11/16 1403 02/11/16 1614   02/11/16 1000  tinidazole (TINDAMAX) tablet 500 mg  Status:  Discontinued     500 mg Oral 2 times daily 02/08/16 1124 02/20/16 0908   02/10/16 1400  neomycin (MYCIFRADIN) tablet 1,000 mg     1,000 mg Oral 3 times daily 02/08/16 1603 02/10/16 2045   02/10/16 1400  metroNIDAZOLE (FLAGYL) tablet 500 mg  Status:  Discontinued     500 mg Oral 3 times daily 02/08/16 1603 02/10/16 0802   02/08/16 2000  metroNIDAZOLE (FLAGYL) tablet 500 mg  Status:  Discontinued     500 mg Oral Every 8 hours 02/08/16 1613 02/11/16 1500   02/02/16 1100  metroNIDAZOLE (FLAGYL) tablet 500 mg  Status:  Discontinued     500 mg Oral Every 12 hours 02/02/16 1024 02/08/16 1611   02/02/16 1100  ciprofloxacin  (CIPRO) tablet 500 mg  Status:  Discontinued     500 mg Oral 2 times daily 02/02/16 1024 02/20/16 0908   01/31/16 1400  fluconazole (DIFLUCAN) tablet 200 mg  Status:  Discontinued     200 mg Oral  Once 01/31/16 1204 02/20/16 0908   01/31/16 0000  imipenem-cilastatin (PRIMAXIN) 500 mg in sodium chloride 0.9 % 100 mL IVPB  Status:  Discontinued     500 mg 200 mL/hr over 30 Minutes Intravenous Every 6 hours 01/30/16 1840 01/31/16 0957   01/30/16 1900  imipenem-cilastatin (PRIMAXIN) 500 mg in sodium chloride 0.9 % 100 mL IVPB     500 mg 200 mL/hr over 30 Minutes Intravenous STAT 01/30/16 1840 01/30/16 1956   01/29/16 2200  metroNIDAZOLE (FLAGYL) tablet 500 mg  Status:  Discontinued     500 mg Oral Every 8 hours 01/29/16 1700 01/30/16 1829   01/29/16 2000  ciprofloxacin (CIPRO) tablet 500 mg  Status:  Discontinued     500 mg Oral 2 times daily 01/29/16 1700 01/30/16 1829   01/22/16 0900  piperacillin-tazobactam (ZOSYN) IVPB 3.375 g  Status:  Discontinued     3.375 g 12.5 mL/hr over 240 Minutes Intravenous Every 8 hours 01/22/16 0840 01/29/16 1700   01/21/16 1830  ciprofloxacin (CIPRO) IVPB 400 mg  Status:  Discontinued     400 mg 200 mL/hr over 60 Minutes Intravenous Every 12 hours 01/21/16 1744 01/22/16 0840   01/21/16 1830  metroNIDAZOLE (FLAGYL) IVPB 500 mg  Status:  Discontinued     500 mg 100 mL/hr over 60 Minutes Intravenous Every 6 hours 01/21/16 1744 01/22/16 0840       Assessment/Plan Crohns  - taperingprednisone per Gi: keep 87m dose of prednisone until this weekend, then decrease to 15 mg for about 5 days, then 10 mg for 5 days, then 5 mg for 5 days, then stop - Remicade per GI if continuing 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 MHassell Done- POD11 - ileostomy functioning  - no n/v yesterday. Abdominal pain improved. - prealbumin 15.6 (02/22/16)  ABL anemia -hg 9.2, stable Chronic  pain Anxiety   ID - cipro 10/7>>10/25, tinidazole 10/16>>10/25 FEN - full liquids VTE - heparin, SCDs  Plan - feeling much better today, denies abdominal pain. No n/v yesterday. Tolerating more PO intake. Continue scheduled zofran and simethicone TID. Continue to work towards increased PO intake, working with dietician. Ostomy functioning. Continue local wound care as before. Please document diet intake/output well.    LOS: 45 days    BROOKE  Thereasa Distance , [facility] Surgery 02/22/2016, 8:04 AM Pager: [phone] Consults: [phone] Mon-Fri 7:00 am-4:30 pm Sat-Sun 7:00 am-11:30 am

## 2016-02-22 NOTE — Progress Notes (Signed)
Physical Therapy Treatment Patient Details Name: RYLIEGH MCDUFFEY MRN: [id number] DOB: 07-01-1940 Today's Date: 02/22/2016    History of Present Illness 75 yo female s/p end ileostomy 10/17    PT Comments    Pt continues to participate well with therapy. Worked on challenging balance this session-ambulation without device vs with 1 hand support. LOB x 3 during session requiring external assist to prevent fall. Continue to recommend ST rehab at SNF to improve strength, balance and activity tolerance.   Follow Up Recommendations  SNF     Equipment Recommendations  Rolling walker with 5" wheels    Recommendations for Other Services OT consult     Precautions / Restrictions Precautions Precautions: Fall Restrictions Weight Bearing Restrictions: No    Mobility  Bed Mobility Overal bed mobility: Needs Assistance Bed Mobility: Supine to Sit;Sit to Supine     Supine to sit: HOB elevated;Supervision Sit to supine: HOB elevated;Supervision      Transfers Overall transfer level: Needs assistance Equipment used: None Transfers: Sit to/from Stand Sit to Stand: Min assist         General transfer comment: Assist to rise, stabilize, control descent. LOB x1 with static standing and pt turning head to speak with therapist  Ambulation/Gait Ambulation/Gait assistance: Min assist Ambulation Distance (Feet): 175 Feet Assistive device: None (IV pole) Gait Pattern/deviations: Step-through pattern;Decreased stride length;Decreased step length - right;Decreased step length - left;Decreased dorsiflexion - left;Decreased dorsiflexion - right;Drifts right/left     General Gait Details: Assist to stabilize throughout ambulation distance. LOB x 2 when walking without a device or any support. Cues for increased step length and DF. Pt tolerated distance well. Used IV pole to provide 1 point of contact to improve stability.   Stairs            Wheelchair Mobility    Modified Rankin  (Stroke Patients Only)       Balance Overall balance assessment: Needs assistance;History of Falls         Standing balance support: No upper extremity supported Standing balance-Leahy Scale: Poor Standing balance comment: Worked on challenging balance this session. Ambulation without an assistive device vs 1 hand support on IV pole. LOB x 3 during session requiring external assist to prevent fall                    Cognition Arousal/Alertness: Awake/alert Behavior During Therapy: WFL for tasks assessed/performed Overall Cognitive Status: Within Functional Limits for tasks assessed                      Exercises      General Comments        Pertinent Vitals/Pain Pain Assessment: 0-10 Pain Score: 6  Pain Location: abdomen,rectum, chest Pain Descriptors / Indicators: Sore Pain Intervention(s): Limited activity within patient's tolerance;Repositioned    Home Living                      Prior Function            PT Goals (current goals can now be found in the care plan section) Progress towards PT goals: Progressing toward goals    Frequency    Min 3X/week      PT Plan Current plan remains appropriate    Co-evaluation             End of Session Equipment Utilized During Treatment: Gait belt Activity Tolerance: Patient tolerated treatment well Patient left: in bed;with call  bell/phone within reach;with family/visitor present;with bed alarm set     Time: 3716-9678 PT Time Calculation (min) (ACUTE ONLY): 12 min  Charges:  $Gait Training: 8-22 mins                    G Codes:      Weston Anna, MPT Pager: [phone]

## 2016-02-22 NOTE — Progress Notes (Signed)
Occupational Therapy Treatment Patient Details Name: NYLIAH NIERENBERG MRN: [id number] DOB: 1940-12-30 Today's Date: 02/22/2016    History of present illness 75 yo female s/p end ileostomy 10/17   OT comments  Pt and sister very pleased with training with wound care nurse. Both feel much more I with this task  Follow Up Recommendations  SNF;Supervision/Assistance - 24 hour    Equipment Recommendations  None recommended by OT       Precautions / Restrictions Precautions Precautions: Fall Restrictions Weight Bearing Restrictions: No       Mobility Bed Mobility Overal bed mobility: Needs Assistance Bed Mobility: Supine to Sit;Sit to Supine     Supine to sit: HOB elevated;Supervision Sit to supine: HOB elevated;Supervision      Transfers Overall transfer level: Needs assistance Equipment used: None Transfers: Sit to/from Stand Sit to Stand: Min assist         General transfer comment: Assist to rise, stabilize, control descent. LOB x1 with static standing and pt turning head to speak with therapist    Balance Overall balance assessment: Needs assistance;History of Falls         Standing balance support: No upper extremity supported Standing balance-Leahy Scale: Poor Standing balance comment: Worked on challenging balance this session. Ambulation without an assistive device vs 1 hand support on IV pole. LOB x 3 during session requiring external assist to prevent fall                   ADL   Eating/Feeding: Independent;Sitting   Grooming: Oral care;Wash/dry face;Wash/dry hands;Supervision/safety;Standing                               Functional mobility during ADLs: Min guard;Cueing for safety;Cueing for sequencing General ADL Comments: Pt performed functional mobility in her room with walker focusing on safe transfers       Vision                     Perception     Praxis      Cognition   Behavior During Therapy: [facility] for  tasks assessed/performed Overall Cognitive Status: Within Functional Limits for tasks assessed                       Extremity/Trunk Assessment                          Pertinent Vitals/ Pain       Pain Assessment: 0-10 Pain Score: 3  Pain Location: rectum Pain Descriptors / Indicators: Sore Pain Intervention(s): Repositioned;Monitored during session         Frequency  Min 2X/week           Plan Discharge plan remains appropriate    Co-evaluation                 End of Session Equipment Utilized During Treatment: Rolling walker   Activity Tolerance Patient limited by pain   Patient Left Other (comment);with call bell/phone within reach (in bathroom with sister)   Nurse Communication Mobility status        Time: 1135-1155 OT Time Calculation (min): 20 min  Charges: OT General Charges $OT Visit: 1 Procedure OT Treatments $Self Care/Home Management : 8-22 mins  Adellyn Capek, Thereasa Parkin 02/22/2016, 12:28 PM

## 2016-02-23 ENCOUNTER — Inpatient Hospital Stay (HOSPITAL_COMMUNITY): Payer: Commercial Managed Care - HMO

## 2016-02-23 LAB — GLUCOSE, CAPILLARY
GLUCOSE-CAPILLARY: 102 mg/dL — AB (ref 65–99)
GLUCOSE-CAPILLARY: 94 mg/dL (ref 65–99)
Glucose-Capillary: 85 mg/dL (ref 65–99)
Glucose-Capillary: 80 mg/dL (ref 65–99)
Glucose-Capillary: 90 mg/dL (ref 65–99)

## 2016-02-23 MED ORDER — PREDNISONE 5 MG PO TABS
15.0000 mg | ORAL_TABLET | Freq: Every day | ORAL | Status: DC
  Administered 2016-02-26 – 2016-02-27 (×2): 15 mg via ORAL
  Filled 2016-02-23 (×4): qty 1

## 2016-02-23 MED ORDER — POTASSIUM CHLORIDE 10 MEQ/50ML IV SOLN
10.0000 meq | INTRAVENOUS | Status: AC
  Administered 2016-02-23 (×2): 10 meq via INTRAVENOUS

## 2016-02-23 MED ORDER — TRACE MINERALS CR-CU-MN-SE-ZN 10-1000-500-60 MCG/ML IV SOLN
INTRAVENOUS | Status: AC
  Administered 2016-02-24: via INTRAVENOUS
  Filled 2016-02-23: qty 960

## 2016-02-23 MED ORDER — DEXTROSE-NACL 5-0.9 % IV SOLN
INTRAVENOUS | Status: AC
  Administered 2016-02-23 – 2016-02-24 (×2): via INTRAVENOUS

## 2016-02-23 MED ORDER — FAT EMULSION 20 % IV EMUL
240.0000 mL | INTRAVENOUS | Status: AC
  Administered 2016-02-24: 240 mL via INTRAVENOUS
  Filled 2016-02-23: qty 250

## 2016-02-23 MED ORDER — MAGNESIUM SULFATE 2 GM/50ML IV SOLN
2.0000 g | Freq: Once | INTRAVENOUS | Status: AC
  Administered 2016-02-23: 2 g via INTRAVENOUS
  Filled 2016-02-23: qty 50

## 2016-02-23 MED ORDER — POTASSIUM CHLORIDE 10 MEQ/50ML IV SOLN
10.0000 meq | INTRAVENOUS | Status: AC
  Administered 2016-02-23 (×2): 10 meq via INTRAVENOUS
  Filled 2016-02-23 (×4): qty 50

## 2016-02-23 NOTE — Progress Notes (Signed)
Patient refused prostat protein liquid due to being nauseous.

## 2016-02-23 NOTE — Progress Notes (Addendum)
PHARMACY - ADULT TOTAL PARENTERAL NUTRITION CONSULT NOTE   Pharmacy Consult for TPN Indication: Prolonged ileus, not tolerating PO  Patient Measurements: Height: 5' 6"  (167.6 cm) Weight: 139 lb 1.6 oz (63.1 kg) IBW/kg (Calculated) : 59.3   Body mass index is 22.45 kg/m. Usual Weight: 73 kg  Insulin Requirements: 1 unit SSI yesterday  Current Nutrition: ordered grape juice + prostat and Lactaid   IVF: D5-NS @ 75 ml/hr  Central access: PICC placed 10/24 TPN start date: 10/28  ASSESSMENT                                                                                                          HPI: 85 yoF with Crohn's admitted 2/37 complicated by perirectal abscesses. Underwent diverting ileostomy 10/17 but failed to tolerate PO diet. Now to start TPN per pharmacy d/t persistent ileus. Most recent AXR shows diffusely dilated small bowel. Patient has also had issues with nausea and copious emesis. Previously was considering inserting NG tube but per surgery, this is no longer a feasible option.  Significant events:   Today: (most recent blood work from 10/27)   Glucose - No Hx DM; CBGs at goal off TPN  Electrolytes - wnl; K borderline low  Renal - SCr wnl; CrCl 58 ml/min  LFTs - Albumin low; liver enzymes wnl  TGs - borderline high  Prealbumin - slightly low  NUTRITIONAL GOALS                                                                                             RD recs: Kcal:  1500-1730 (MSJ x 1.3-1.5) Protein:  88-95 grams (1.4-1.5 grams/kg) Fluid:  1.5-1.7 L/day  Clinimix E 5/15 at a goal rate of 70 ml/hr + 20% fat emulsion at 10 ml/hr to provide: 84 g/day protein, 1672 Kcal/day.  PLAN                                                                                                                         Keep K > 4 and Mg > 2 with ileus:  KCl 10 mEq IV x 4  Mg 2g IV x 1  At 1800 today:  Start Clinimix E 5/20 at 40 ml/hr (E 5/15 1L bags unavailable d/t  critical shortage)  20% fat emulsion at 10 ml/hr.  Plan to advance as tolerated to the goal rate.  TPN to contain standard multivitamins and trace elements.  Reduce IVF to 80 ml/hr.  Continue sensitive SSI as ordered.   TPN lab panels on Mondays & Thursdays.  F/u daily.  Please re-evaluate daily for PO tolerance and ability to wean TPN to preserve extremely limited supply  Reuel Boom, PharmD, BCPS Pager: [phone] 02/23/2016, 12:22 PM

## 2016-02-23 NOTE — Progress Notes (Signed)
Patient just vomited 1,550 ml green emesis.

## 2016-02-23 NOTE — Progress Notes (Signed)
General Surgery [facility]  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       Office: [phone]    Subjective: Patient in bed, family at bedside.  Complains of nausea, unable to eat.  Did not tolerate grape juice.  Objective: Vital signs in last 24 hours: Temp:  [97.8 F (36.6 C)-98.4 F (36.9 C)] 97.8 F (36.6 C) (10/28 0440) Pulse Rate:  [68-91] 91 (10/28 0440) Resp:  [18-20] 18 (10/28 0440) BP: (122-160)/(67-80) 160/78 (10/28 0440) SpO2:  [95 %-97 %] 97 % (10/28 0440) Weight:  [63.1 kg (139 lb 1.6 oz)] 63.1 kg (139 lb 1.6 oz) (10/28 0440) Last BM Date: 02/21/16  Intake/Output from previous day: 10/27 0701 - 10/28 0700 In: 600 [I.V.:600] Out: 1900 [Urine:250; Emesis/NG output:1550; Stool:100] Intake/Output this shift: No intake/output data recorded.  Physical Exam: HEENT - sclerae clear, mucous membranes moist Neck - soft Chest - clear bilaterally Cor - RRR Abdomen - soft, mild distension; few BS present; stoma viable, green liquid in ostomy bag; wounds dry and intact Ext - no edema, non-tender Neuro - alert & oriented, no focal deficits  Lab Results:   Recent Labs  02/22/16 0500  WBC 7.9  HGB 9.2*  HCT 29.1*  PLT 206   BMET  Recent Labs  02/22/16 0500  NA 136  K 3.5  CL 101  CO2 28  GLUCOSE 82  BUN <5*  CREATININE 0.52   CALCIUM 8.5*   PT/INR No results for input(s): LABPROT, INR in the last 72 hours. Comprehensive Metabolic Panel:    Component Value Date/Time   NA 136 02/22/2016 0500   NA 137 02/20/2016 0335   NA 138 12/06/2014 0804   K 3.5 02/22/2016 0500   K 3.6 02/20/2016 0335   K 4.1 12/06/2014 0804   CL 101 02/22/2016 0500   CL 106 02/20/2016 0335   CO2 28 02/22/2016 0500   CO2 26 02/20/2016 0335   CO2 25 12/06/2014 0804   BUN <5 (L) 02/22/2016 0500   BUN <5 (L) 02/20/2016 0335   BUN 12.3 12/06/2014 0804   CREATININE 0.52 02/22/2016 0500   CREATININE 0.57 02/20/2016 0335   CREATININE 0.8 12/06/2014 0804   GLUCOSE 82 02/22/2016 0500   GLUCOSE 93 02/20/2016 0335   GLUCOSE 92 12/06/2014 0804   CALCIUM 8.5 (L) 02/22/2016 0500   CALCIUM 8.5 (L) 02/20/2016 0335   CALCIUM 9.4 12/06/2014 0804   AST 12 (L) 02/22/2016 0500   AST 13 (L) 02/12/2016 0517   AST 9 12/06/2014 0804   ALT 10 (L) 02/22/2016 0500   ALT 14 02/12/2016 0517   ALT 10 12/06/2014 0804   ALKPHOS 44 02/22/2016 0500   ALKPHOS 57 02/12/2016 0517   ALKPHOS 89 12/06/2014 0804   BILITOT 0.2 (L) 02/22/2016 0500   BILITOT 0.5 02/12/2016 0517  BILITOT 0.37 12/06/2014 0804   PROT 4.6 (L) 02/22/2016 0500   PROT 5.2 (L) 02/12/2016 0517   PROT 6.5 12/06/2014 0804   ALBUMIN 2.3 (L) 02/22/2016 0500   ALBUMIN 2.5 (L) 02/12/2016 0517   ALBUMIN 3.3 (L) 12/06/2014 0804    Studies/Results: No results found.    Nadea Kirkland M 02/23/2016  Patient ID: Sara Barnes, female   DOB: [birth year], 75 y.o.   MRN: [id number]

## 2016-02-24 LAB — GLUCOSE, CAPILLARY
GLUCOSE-CAPILLARY: 115 mg/dL — AB (ref 65–99)
GLUCOSE-CAPILLARY: 87 mg/dL (ref 65–99)
Glucose-Capillary: 120 mg/dL — ABNORMAL HIGH (ref 65–99)
Glucose-Capillary: 128 mg/dL — ABNORMAL HIGH (ref 65–99)
Glucose-Capillary: 82 mg/dL (ref 65–99)
Glucose-Capillary: 98 mg/dL (ref 65–99)

## 2016-02-24 LAB — PHOSPHORUS: Phosphorus: 4.1 mg/dL (ref 2.5–4.6)

## 2016-02-24 LAB — BASIC METABOLIC PANEL
Anion gap: 6 (ref 5–15)
BUN: 7 mg/dL (ref 6–20)
CALCIUM: 8.3 mg/dL — AB (ref 8.9–10.3)
CO2: 25 mmol/L (ref 22–32)
CREATININE: 0.62 mg/dL (ref 0.44–1.00)
Chloride: 101 mmol/L (ref 101–111)
GFR calc Af Amer: >60 mL/min (ref >60)
GFR calc non Af Amer: >60 mL/min (ref >60)
GLUCOSE: 99 mg/dL (ref 65–99)
Potassium: 3.7 mmol/L (ref 3.5–5.1)
Sodium: 132 mmol/L — ABNORMAL LOW (ref 135–145)

## 2016-02-24 LAB — MAGNESIUM: Magnesium: 2.3 mg/dL (ref 1.7–2.4)

## 2016-02-24 MED ORDER — FAT EMULSION 20 % IV EMUL
240.0000 mL | INTRAVENOUS | Status: AC
  Administered 2016-02-24: 240 mL via INTRAVENOUS
  Filled 2016-02-24: qty 240

## 2016-02-24 MED ORDER — POTASSIUM CHLORIDE 10 MEQ/50ML IV SOLN
10.0000 meq | INTRAVENOUS | Status: AC
  Administered 2016-02-24 (×3): 10 meq via INTRAVENOUS
  Filled 2016-02-24 (×3): qty 50

## 2016-02-24 MED ORDER — TRACE MINERALS CR-CU-MN-SE-ZN 10-1000-500-60 MCG/ML IV SOLN
INTRAVENOUS | Status: AC
  Administered 2016-02-24: 18:00:00 via INTRAVENOUS
  Filled 2016-02-24: qty 1680

## 2016-02-24 MED ORDER — MORPHINE SULFATE (PF) 4 MG/ML IV SOLN
4.0000 mg | Freq: Once | INTRAVENOUS | Status: AC
  Administered 2016-02-24: 4 mg via INTRAMUSCULAR
  Filled 2016-02-24: qty 1

## 2016-02-24 MED ORDER — SODIUM CHLORIDE 0.9 % IV SOLN
INTRAVENOUS | Status: DC
  Administered 2016-02-25 – 2016-02-27 (×2): via INTRAVENOUS

## 2016-02-24 NOTE — Progress Notes (Signed)
PHARMACY - ADULT TOTAL PARENTERAL NUTRITION CONSULT NOTE   Pharmacy Consult for TPN Indication: Prolonged ileus, not tolerating PO  Patient Measurements: Height: 5' 6"  (167.6 cm) Weight: 139 lb 1.6 oz (63.1 kg) IBW/kg (Calculated) : 59.3   Body mass index is 22.45 kg/m. Usual Weight: 73 kg  Insulin Requirements: 1 unit SSI yesterday  Current Nutrition: ordered grape juice + prostat and Lactaid   IVF: D5-NS @ 80 ml/hr  Central access: PICC placed 10/24 TPN start date: 10/28  ASSESSMENT                                                                                                          HPI: 89 yoF with Crohn's admitted 7/74 complicated by perirectal abscesses. Underwent diverting ileostomy 10/17 but failed to tolerate PO diet. Now to start TPN per pharmacy d/t persistent ileus. Most recent AXR shows diffusely dilated small bowel. Patient has also had issues with nausea and copious emesis. Previously was considering inserting NG tube but per surgery, this is no longer a feasible option.  Significant events:  10/29: originally placed single lumen PICC; changed to double lumen today per MD request. Exchange made at noon today, so will not receive TPN between 1200 and 1800 today (will receive D5/NS at 80 ml/hr during this time)  Today: (most recent blood work from 10/27)   Glucose - No Hx DM; CBGs at goal on initial TPN  Electrolytes - except Na now low  Renal - SCr wnl; CrCl 58 ml/min  LFTs - Albumin low; liver enzymes wnl  TGs - borderline high  Prealbumin - slightly low  NUTRITIONAL GOALS                                                                                             RD recs: Kcal:  1500-1730 (MSJ x 1.3-1.5) Protein:  88-95 grams (1.4-1.5 grams/kg) Fluid:  1.5-1.7 L/day  Clinimix E 5/15 at a goal rate of 70 ml/hr + 20% fat emulsion at 10 ml/hr to provide: 84 g/day protein, 1672 Kcal/day.  PLAN  Keep K > 4 and Mg > 2 with ileus:  KCl 10 mEq IV x 3  At 1800 today:  Advance Clinimix E 5/15 to goal rate of 70 ml/hr.   20% fat emulsion at 10 ml/hr.  TPN to contain standard multivitamins and trace elements.  Reduce IVF to 50 ml/hr, remove dextrose  Continue sensitive SSI as ordered.   TPN lab panels on Mondays & Thursdays.  F/u daily.  Please re-evaluate daily for PO tolerance and ability to wean TPN to preserve extremely limited supply  Reuel Boom, PharmD, BCPS Pager: [phone] 02/24/2016, 11:09 AM

## 2016-02-24 NOTE — Progress Notes (Signed)
General Surgery [facility]  Assessment & Plan:  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 Hassell Done             ileostomy functioning             poor po intake, recurrent nausea and emesis - improved this AM             Likely persistent ileus - AXR with diffusely dilated small bowel - ?motility issue  Crohns/IBD             taperingprednisone per GI consultant             Remicade per GI - ? 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       Office: [phone]    Subjective: Patient in much better spirits this AM, sister at bedside.  No nausea or emesis overnight.  Objective: Vital signs in last 24 hours: Temp:  [98.5 F (36.9 C)-101.7 F (38.7 C)] 100.8 F (38.2 C) (10/29 0540) Pulse Rate:  [75-103] 103 (10/29 0436) Resp:  [18] 18 (10/29 0436) BP: (118-136)/(65-79) 136/79 (10/29 0436) SpO2:  [94 %-97 %] 96 % (10/29 0436) Last BM Date: 02/23/16  Intake/Output from previous day: 10/28 0701 - 10/29 0700 In: 474.7 [I.V.:474.7] Out: 1450 [Stool:1450] Intake/Output this shift: No intake/output data recorded.  Physical Exam: HEENT - sclerae clear, mucous membranes moist Neck - soft Abdomen - soft without distension; active BS present; small green succus in ileostomy bag, stoma viable Ext - no edema, non-tender Neuro - alert & oriented, no focal deficits  Lab Results:   Recent Labs  02/22/16 0500  WBC 7.9  HGB 9.2*  HCT 29.1*  PLT 206   BMET  Recent Labs  02/22/16 0500  NA 136  K 3.5  CL 101  CO2 28   GLUCOSE 82  BUN <5*  CREATININE 0.52  CALCIUM 8.5*   PT/INR No results for input(s): LABPROT, INR in the last 72 hours. Comprehensive Metabolic Panel:    Component Value Date/Time   NA 136 02/22/2016 0500   NA 137 02/20/2016 0335   NA 138 12/06/2014 0804   K 3.5 02/22/2016 0500   K 3.6 02/20/2016 0335   K 4.1 12/06/2014 0804   CL 101 02/22/2016 0500   CL 106 02/20/2016 0335   CO2 28 02/22/2016 0500   CO2 26 02/20/2016 0335   CO2 25 12/06/2014 0804   BUN <5 (L) 02/22/2016 0500   BUN <5 (L) 02/20/2016 0335   BUN 12.3 12/06/2014 0804   CREATININE 0.52 02/22/2016 0500   CREATININE 0.57 02/20/2016 0335   CREATININE 0.8 12/06/2014 0804   GLUCOSE 82 02/22/2016 0500   GLUCOSE 93 02/20/2016 0335   GLUCOSE 92 12/06/2014 0804  CALCIUM 8.5 (L) 02/22/2016 0500   CALCIUM 8.5 (L) 02/20/2016 0335   CALCIUM 9.4 12/06/2014 0804   AST 12 (L) 02/22/2016 0500   AST 13 (L) 02/12/2016 0517   AST 9 12/06/2014 0804   ALT 10 (L) 02/22/2016 0500   ALT 14 02/12/2016 0517   ALT 10 12/06/2014 0804   ALKPHOS 44 02/22/2016 0500   ALKPHOS 57 02/12/2016 0517   ALKPHOS 89 12/06/2014 0804   BILITOT 0.2 (L) 02/22/2016 0500   BILITOT 0.5 02/12/2016 0517   BILITOT 0.37 12/06/2014 0804   PROT 4.6 (L) 02/22/2016 0500   PROT 5.2 (L) 02/12/2016 0517   PROT 6.5 12/06/2014 0804   ALBUMIN 2.3 (L) 02/22/2016 0500   ALBUMIN 2.5 (L) 02/12/2016 0517   ALBUMIN 3.3 (L) 12/06/2014 0804    Studies/Results: Dg Abd 2 Views  Result Date: 02/23/2016 CLINICAL DATA:  Abdominal pain today EXAM: ABDOMEN - 2 VIEW COMPARISON:  02/20/2016 FINDINGS: There is no free intraperitoneal gas. Dilated small bowel loops with air-fluid levels have improved. There is no significant large bowel gas. Right lower quadrant ostomy site is noted. IMPRESSION: Improving small bowel obstruction pattern. Electronically Signed   By: Marybelle Killings M.D.   On: 02/23/2016 14:38      Sara Barnes M 02/24/2016  Patient ID: Sara Barnes,  female   DOB: Apr 24, 1941, 75 y.o.   MRN: [id number]

## 2016-02-24 NOTE — Progress Notes (Signed)
Peripherally Inserted Central Catheter/Midline Placement  The IV Nurse has discussed with the patient and/or persons authorized to consent for the patient, the purpose of this procedure and the potential benefits and risks involved with this procedure.  The benefits include less needle sticks, lab draws from the catheter, and the patient may be discharged home with the catheter. Risks include, but not limited to, infection, bleeding, blood clot (thrombus formation), and puncture of an artery; nerve damage and irregular heartbeat and possibility to perform a PICC exchange if needed/ordered by physician.  Alternatives to this procedure were also discussed.  Bard Power PICC patient education guide, fact sheet on infection prevention and patient information card has been provided to patient /or left at bedside.    PICC/Midline Placement Documentation  PICC Double Lumen 54/49/20 PICC Right Basilic 36 cm 0 cm (Active)  Indication for Insertion or Continuance of Line Administration of hyperosmolar/irritating solutions (i.e. TPN, Vancomycin, etc.);Prolonged intravenous therapies 02/24/2016 11:57 AM  Exposed Catheter (cm) 0 cm 02/24/2016 11:57 AM  Site Assessment Clean;Dry;Intact;Ecchymotic 02/24/2016 11:57 AM  Lumen #1 Status Flushed;Saline locked;Blood return noted 02/24/2016 11:57 AM  Lumen #2 Status Flushed;Saline locked;Blood return noted 02/24/2016 11:57 AM  Dressing Type Transparent 02/24/2016 11:57 AM  Dressing Status Clean;Dry;Intact;Antimicrobial disc in place 02/24/2016 11:57 AM  Line Care Connections checked and tightened 02/24/2016 11:57 AM  Line Adjustment (NICU/IV Team Only) No 02/24/2016 11:57 AM  Dressing Intervention New dressing 02/24/2016 11:57 AM  Dressing Change Due 03/02/16 02/24/2016 11:57 AM       Sara Barnes 02/24/2016, 11:57 AM

## 2016-02-25 ENCOUNTER — Encounter (HOSPITAL_COMMUNITY)
Admission: RE | Admit: 2016-02-25 | Payer: Commercial Managed Care - HMO | Source: Ambulatory Visit | Admitting: Internal Medicine

## 2016-02-25 LAB — CBC
HCT: 29.9 % — ABNORMAL LOW (ref 36.0–46.0)
Hemoglobin: 9.8 g/dL — ABNORMAL LOW (ref 12.0–15.0)
MCH: 30 pg (ref 26.0–34.0)
MCHC: 32.8 g/dL (ref 30.0–36.0)
MCV: 91.4 fL (ref 78.0–100.0)
PLATELETS: 219 10*3/uL (ref 150–400)
RBC: 3.27 MIL/uL — AB (ref 3.87–5.11)
RDW: 15.1 % (ref 11.5–15.5)
WBC: 7.7 10*3/uL (ref 4.0–10.5)

## 2016-02-25 LAB — COMPREHENSIVE METABOLIC PANEL
ALBUMIN: 2.3 g/dL — AB (ref 3.5–5.0)
ALT: 11 U/L — ABNORMAL LOW (ref 14–54)
AST: 11 U/L — ABNORMAL LOW (ref 15–41)
Alkaline Phosphatase: 59 U/L (ref 38–126)
Anion gap: 5 (ref 5–15)
BILIRUBIN TOTAL: 0.4 mg/dL (ref 0.3–1.2)
BUN: 11 mg/dL (ref 6–20)
CO2: 22 mmol/L (ref 22–32)
Calcium: 7.6 mg/dL — ABNORMAL LOW (ref 8.9–10.3)
Chloride: 101 mmol/L (ref 101–111)
Creatinine, Ser: 0.66 mg/dL (ref 0.44–1.00)
GFR calc Af Amer: >60 mL/min (ref >60)
GFR calc non Af Amer: >60 mL/min (ref >60)
Glucose, Bld: 105 mg/dL — ABNORMAL HIGH (ref 65–99)
POTASSIUM: 4.2 mmol/L (ref 3.5–5.1)
Sodium: 128 mmol/L — ABNORMAL LOW (ref 135–145)
TOTAL PROTEIN: 4.9 g/dL — AB (ref 6.5–8.1)

## 2016-02-25 LAB — GLUCOSE, CAPILLARY
GLUCOSE-CAPILLARY: 119 mg/dL — AB (ref 65–99)
GLUCOSE-CAPILLARY: 123 mg/dL — AB (ref 65–99)

## 2016-02-25 LAB — DIFFERENTIAL
BASOS ABS: 0 10*3/uL (ref 0.0–0.1)
Basophils Relative: 0 %
EOS PCT: 1 %
Eosinophils Absolute: 0.1 10*3/uL (ref 0.0–0.7)
LYMPHS PCT: 24 %
Lymphs Abs: 1.9 10*3/uL (ref 0.7–4.0)
Monocytes Absolute: 0.8 10*3/uL (ref 0.1–1.0)
Monocytes Relative: 10 %
NEUTROS ABS: 5 10*3/uL (ref 1.7–7.7)
NEUTROS PCT: 65 %

## 2016-02-25 LAB — MAGNESIUM: MAGNESIUM: 2.1 mg/dL (ref 1.7–2.4)

## 2016-02-25 LAB — TRIGLYCERIDES: Triglycerides: 157 mg/dL — ABNORMAL HIGH (ref <150)

## 2016-02-25 LAB — PHOSPHORUS: PHOSPHORUS: 2.8 mg/dL (ref 2.5–4.6)

## 2016-02-25 LAB — PREALBUMIN: PREALBUMIN: 13.4 mg/dL — AB (ref 18–38)

## 2016-02-25 MED ORDER — FAT EMULSION 20 % IV EMUL
240.0000 mL | INTRAVENOUS | Status: AC
  Administered 2016-02-25: 240 mL via INTRAVENOUS
  Filled 2016-02-25: qty 250

## 2016-02-25 MED ORDER — BOOST / RESOURCE BREEZE PO LIQD: 1.0000 | Freq: Three times a day (TID) | ORAL | Status: DC | PRN

## 2016-02-25 MED ORDER — TRACE MINERALS CR-CU-MN-SE-ZN 10-1000-500-60 MCG/ML IV SOLN: INTRAVENOUS | Status: DC

## 2016-02-25 MED ORDER — TRACE MINERALS CR-CU-MN-SE-ZN 10-1000-500-60 MCG/ML IV SOLN
INTRAVENOUS | Status: AC
  Administered 2016-02-25: 17:00:00 via INTRAVENOUS
  Filled 2016-02-25: qty 1680

## 2016-02-25 MED ORDER — FAT EMULSION 20 % IV EMUL: 240.0000 mL | INTRAVENOUS | Status: DC

## 2016-02-25 NOTE — Progress Notes (Signed)
PHARMACY - ADULT TOTAL PARENTERAL NUTRITION CONSULT NOTE   Pharmacy Consult for TPN Indication: Prolonged ileus, not tolerating PO  Patient Measurements: Height: 5' 6"  (167.6 cm) Weight: 130 lb (59 kg) IBW/kg (Calculated) : 59.3   Body mass index is 20.98 kg/m. Usual Weight: 73 kg  Insulin Requirements: none  Current Nutrition: Clear liquid diet ordered- tolerating small sips of water, grape juice, & Jello without nausea.  Had Prostat x1 on 10/29.  IVF: NS @ 50 ml/hr  Central access: PICC placed 10/24 TPN start date: 10/28  ASSESSMENT                                                                                                          HPI: 89 yoF with Crohn's admitted 4/40 complicated by perirectal abscesses. Underwent diverting ileostomy 10/17 but failed to tolerate PO diet. Now to start TPN per pharmacy d/t persistent ileus. Most recent AXR shows diffusely dilated small bowel. Patient has also had issues with nausea and copious emesis. Previously was considering inserting NG tube but per surgery, this is no longer a feasible option.  Significant events:  10/29: changed to double lumen PICC  Today:  Glucose - No Hx DM; CBGs at goal <150  Electrolytes - WNL except Na 128 (trending down).  Goal K>4 & Mag>2 with ileus.   Renal - SCr wnl; CrCl ~58 ml/min  LFTs - TBili WNL, others low  TGs - 152 (10/27), 157 (10/30)  Prealbumin - 15.6 (10/27), 13.4 (10/30)  NUTRITIONAL GOALS                                                                                             RD recs: Kcal:  1500-1730 (MSJ x 1.3-1.5) Protein:  88-95 grams (1.4-1.5 grams/kg) Fluid:  1.5-1.7 L/day  Clinimix E 5/15 at a goal rate of 70 ml/hr + 20% fat emulsion at 10 ml/hr to provide: 84 g/day protein, 1672 Kcal/day.  PLAN                                                                                                                    At 1800 today:  Continue Clinimix E 5/15 at goal rate of 70  ml/hr.   20% fat emulsion at 10 ml/hr.  TPN to contain standard multivitamins and trace elements.  Continue IVF at 50 ml/hr  Continue sensitive SSI as ordered.   TPN lab panels on Mondays & Thursdays.  F/u daily. Re-evaluate daily for PO tolerance and ability to wean TPN to preserve extremely limited supply of Clinimix due to Dealer.  Netta Cedars, PharmD, BCPS Pager: [phone] 02/25/2016, 7:33 AM

## 2016-02-25 NOTE — Progress Notes (Signed)
Patient ID: Sara Barnes, female   DOB: January 29, 1941, 75 y.o.   MRN: [id number]  [facility] Surgery Progress Note  14 Days Post-Op  Subjective: Had a bad weekend but feeling better today. Denies n/v or abdominal pain. Tolerating small amounts of clear liquids.  Objective: Vital signs in last 24 hours: Temp:  [98 F (36.7 C)-100 F (37.8 C)] 100 F (37.8 C) (10/30 0600) Pulse Rate:  [85-89] 89 (10/30 0600) Resp:  [18-20] 20 (10/30 0600) BP: (119-135)/(63-68) 123/63 (10/30 0600) SpO2:  [97 %-99 %] 97 % (10/30 0600) Weight:  [130 lb (59 kg)] 130 lb (59 kg) (10/30 0600) Last BM Date: 02/25/16  Intake/Output from previous day: 10/29 0701 - 10/30 0700 In: 2645 [P.O.:30; I.V.:2465; IV Piggyback:150] Out: 2175 [Urine:750; IBBCW:8889] Intake/Output this shift: No intake/output data recorded.  PE: Gen: Alert, NAD, pleasant Card: RRR Pulm: CTAB, effort normal Abd: Soft, ND, NT, +BS, incisions C/D/I, RLQ ostomy pink with small amount liquid brown stool and air in bag Perineum: penrose drain in place with minimal purulent drainage, skin appears less erythematous and has no breakdown   Lab Results:   Recent Labs  02/25/16 0530  WBC 7.7  HGB 9.8*  HCT 29.9*  PLT 219   BMET  Recent Labs  02/24/16 0500 02/25/16 0530  NA 132* 128*  K 3.7 4.2  CL 101 101  CO2 25 22  GLUCOSE 99 105*  BUN 7 11  CREATININE 0.62 0.66  CALCIUM 8.3* 7.6*   PT/INR No results for input(s): LABPROT, INR in the last 72 hours. CMP     Component Value Date/Time   NA 128 (L) 02/25/2016 0530   NA 138 12/06/2014 0804   K 4.2 02/25/2016 0530   K 4.1 12/06/2014 0804   CL 101 02/25/2016 0530   CO2 22 02/25/2016 0530   CO2 25 12/06/2014 0804   GLUCOSE 105 (H) 02/25/2016 0530   GLUCOSE 92 12/06/2014 0804   BUN 11 02/25/2016 0530   BUN 12.3 12/06/2014 0804   CREATININE 0.66 02/25/2016 0530   CREATININE 0.8 12/06/2014 0804   CALCIUM 7.6 (L) 02/25/2016 0530   CALCIUM 9.4 12/06/2014 0804   PROT 4.9 (L) 02/25/2016 0530   PROT 6.5 12/06/2014 0804   ALBUMIN 2.3 (L) 02/25/2016 0530   ALBUMIN 3.3 (L) 12/06/2014 0804   AST 11 (L) 02/25/2016 0530   AST 9 12/06/2014 0804   ALT 11 (L) 02/25/2016 0530   ALT 10 12/06/2014 0804   ALKPHOS 59 02/25/2016 0530   ALKPHOS 89 12/06/2014 0804   BILITOT 0.4 02/25/2016 0530   BILITOT 0.37 12/06/2014 0804   GFRNONAA >60 02/25/2016 0530   GFRAA >60 02/25/2016 0530   Lipase     Component Value Date/Time   LIPASE 31 04/15/2011 1700       Studies/Results: Dg Abd 2 Views  Result Date: 02/23/2016 CLINICAL DATA:  Abdominal pain today EXAM: ABDOMEN - 2 VIEW COMPARISON:  02/20/2016 FINDINGS: There is no free intraperitoneal gas. Dilated small bowel loops with air-fluid levels have improved. There is no significant large bowel gas. Right lower quadrant ostomy site is noted. IMPRESSION: Improving small bowel obstruction pattern. Electronically Signed   By: Marybelle Killings M.D.   On: 02/23/2016 14:38    Anti-infectives: Anti-infectives    Start     Dose/Rate Route Frequency Ordered Stop   02/11/16 1600  piperacillin-tazobactam (ZOSYN) IVPB 3.375 g     3.375 g 100 mL/hr over 30 Minutes Intravenous Every 8 hours 02/11/16 1403 02/11/16  1614   02/11/16 1000  tinidazole (TINDAMAX) tablet 500 mg  Status:  Discontinued     500 mg Oral 2 times daily 02/08/16 1124 02/20/16 0908   02/10/16 1400  neomycin (MYCIFRADIN) tablet 1,000 mg     1,000 mg Oral 3 times daily 02/08/16 1603 02/10/16 2045   02/10/16 1400  metroNIDAZOLE (FLAGYL) tablet 500 mg  Status:  Discontinued     500 mg Oral 3 times daily 02/08/16 1603 02/10/16 0802   02/08/16 2000  metroNIDAZOLE (FLAGYL) tablet 500 mg  Status:  Discontinued     500 mg Oral Every 8 hours 02/08/16 1613 02/11/16 1500   02/02/16 1100  metroNIDAZOLE (FLAGYL) tablet 500 mg  Status:  Discontinued     500 mg Oral Every 12 hours 02/02/16 1024 02/08/16 1611   02/02/16 1100  ciprofloxacin (CIPRO) tablet 500 mg  Status:   Discontinued     500 mg Oral 2 times daily 02/02/16 1024 02/20/16 0908   01/31/16 1400  fluconazole (DIFLUCAN) tablet 200 mg  Status:  Discontinued     200 mg Oral  Once 01/31/16 1204 02/20/16 0908   01/31/16 0000  imipenem-cilastatin (PRIMAXIN) 500 mg in sodium chloride 0.9 % 100 mL IVPB  Status:  Discontinued     500 mg 200 mL/hr over 30 Minutes Intravenous Every 6 hours 01/30/16 1840 01/31/16 0957   01/30/16 1900  imipenem-cilastatin (PRIMAXIN) 500 mg in sodium chloride 0.9 % 100 mL IVPB     500 mg 200 mL/hr over 30 Minutes Intravenous STAT 01/30/16 1840 01/30/16 1956   01/29/16 2200  metroNIDAZOLE (FLAGYL) tablet 500 mg  Status:  Discontinued     500 mg Oral Every 8 hours 01/29/16 1700 01/30/16 1829   01/29/16 2000  ciprofloxacin (CIPRO) tablet 500 mg  Status:  Discontinued     500 mg Oral 2 times daily 01/29/16 1700 01/30/16 1829   01/22/16 0900  piperacillin-tazobactam (ZOSYN) IVPB 3.375 g  Status:  Discontinued     3.375 g 12.5 mL/hr over 240 Minutes Intravenous Every 8 hours 01/22/16 0840 01/29/16 1700   01/21/16 1830  ciprofloxacin (CIPRO) IVPB 400 mg  Status:  Discontinued     400 mg 200 mL/hr over 60 Minutes Intravenous Every 12 hours 01/21/16 1744 01/22/16 0840   01/21/16 1830  metroNIDAZOLE (FLAGYL) IVPB 500 mg  Status:  Discontinued     500 mg 100 mL/hr over 60 Minutes Intravenous Every 6 hours 01/21/16 1744 01/22/16 0840       Assessment/Plan Crohns  - taperingprednisone per GI - Remicade per GI if continuing 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 Hassell Done - POD14 - ileostomy functioning -- 1425cc output from ileostomy over last 24 hours - no n/v or abdominal pain today  ABL anemia -hg 9.8, stable Malnourished - prealbumin 15.6 (02/22/16). TNA started 10/28 Chronic pain Anxiety - prozac   ID - cipro 10/7>>10/25, tinidazole 10/16>>10/25 FEN - clear liquids, TNA VTE -  heparin, SCDs  Plan - bad weekend but feeling better today. Switching to IV medications has helped with abdominal pain. Tolerating small amounts of clear liquids, continue scheduled zofran and simethicone. Advance to full liquids. Continue TNA. Ostomy functioning. Continue mobilization. Continue local wound care as before.    LOS: 60 days    Jerrye Beavers , [facility] 02/25/2016, 9:21 AM Pager: [phone] Consults: [phone] Mon-Fri 7:00 am-4:30 pm Sat-Sun 7:00 am-11:30 am

## 2016-02-25 NOTE — Progress Notes (Signed)
Nutrition Follow-up  DOCUMENTATION CODES:   Severe malnutrition in context of acute illness/injury  INTERVENTION:   Recommended supplement regimen: -8 oz Grape Juice mixed with Pro-Stat 30 ml 5 times daily. Each grape juice provides approximately 150 kcal and each Pro-Stat provides 100 kcal and 15 grams protein. -Lactaid milk 3 times daily, each container provides 90 kcal and 8 grams protein. If patient is able to drink all of these will have 1520 kcal and 99 grams protein daily.  If patient won't accept Prostat supplements, provide Boost Breeze supplement providing 250 kcal and 9g protein.  RD to order Calorie Count.  RD continues to recommend placement of NG tube and initiation of tube feeding at this time d/t patient's tolerance of liquids and national shortage of Clinimix.  TPN per Pharmacy.  RD will continue to follow closely.  NUTRITION DIAGNOSIS:   Malnutrition (Severe) related to acute illness as evidenced by percent weight loss, energy intake < or equal to 50% for > or equal to 5 days.  Ongoing.  GOAL:   Patient will meet greater than or equal to 90% of their needs  Meeting with TPN.  MONITOR:   PO intake, Supplement acceptance, Weight trends, Labs, I & O's  ASSESSMENT:   75 y.o.femalewith medical history significant of ulcerative colitis versus Crohn's disease, believed to be UC eventually,otherwise healthy, presents to the emergency room with chief complaint of severe abdominal pain for the past several days PTA.  Spoke with RN, states pt is refusing to drink Prostat (has had 1 in the past 72 hours), but is drinking Lactaid milk (providing 90 kcal and 8g protein). Pt feels that she is fine staying on TPN and does not see a need to eat at this time.  Pt also had some jello and grape juice w/o Prostat.  If patient is not willing to take Prostat, will order Boost Breeze.   Attempted to speak with patient to encourage intakes but pt resting and family  member at bedside asked RD to come back at a later time. Pt has received nausea medication recently.   Pt receiving TPN: Clinimix E 5/15 @ 70 ml/hr and ILE @ 10 ml/hr -providing 1672 kcal and 84 g protein. RD continues to recommend initiation of enteral feedings d/t patient's tolerance of liquids and national shortage of Clinimix. Will order Calorie Count to see if TPN can be decreased.  Medications: Zofran IV every 8 hours Labs reviewed: CBGs: 119-128 Low Na Mg/Phos/K WNL TG: 157 mg/dL  Diet Order:  TPN (CLINIMIX-E) Adult Diet full liquid Room service appropriate? Yes; Fluid consistency: Thin TPN (CLINIMIX-E) Adult  Skin:  Wound (see comment) (MSAD to perineum, buttocks; closed incision rectum)  Last BM:  10/30  Height:   Ht Readings from Last 1 Encounters:  01/07/16 5' 6"  (1.676 m)    Weight:   Wt Readings from Last 1 Encounters:  02/25/16 130 lb (59 kg)    Ideal Body Weight:  59.09 kg  BMI:  Body mass index is 20.98 kg/m.  Estimated Nutritional Needs:   Kcal:  1500-1730 (MSJ x 1.3-1.5)  Protein:  88-95 grams (1.4-1.5 grams/kg)  Fluid:  1.5-1.7 L/day  EDUCATION NEEDS:   Education needs no appropriate at this time  Clayton Bibles, MS, RD, LDN Pager: [phone] After Hours Pager: [phone]

## 2016-02-25 NOTE — Progress Notes (Addendum)
     [facility] Progress Note  Subjective: Visiting with son. Actually feels fair today.   Objective:  Vital signs in last 24 hours: Temp:  [98 F (36.7 C)-100 F (37.8 C)] 100 F (37.8 C) (10/30 0600) Pulse Rate:  [85-89] 89 (10/30 0600) Resp:  [18-20] 20 (10/30 0600) BP: (119-135)/(63-68) 123/63 (10/30 0600) SpO2:  [97 %-99 %] 97 % (10/30 0600) Weight:  [130 lb (59 kg)] 130 lb (59 kg) (10/30 0600) Last BM Date: 02/25/16   General:   Alert, well-developed, white female in NAD EENT:  Hard of  hearing, non icteric sclera, conjunctive pink.  Heart:  Regular rate and rhythm; no murmurs. no lower extremity edema Pulm: Normal respiratory effort, lungs CTA bilaterally without wheezes or crackles. Abdomen:  Soft, nondistended, nontender.  Normal bowel sounds, no masses felt. Brownish / green soft stool in ostomy    Neurologic:  Alert and  oriented x4;  grossly normal neurologically. Psych:  Alert and cooperative. Normal mood and affect. Skin:   Intake/Output from previous day: 10/29 0701 - 10/30 0700 In: 2645 [P.O.:30; I.V.:2465; IV Piggyback:150] Out: 2175 [Urine:750; KNLZJ:6734] Intake/Output this shift: No intake/output data recorded.  Lab Results:  Recent Labs  02/25/16 0530  WBC 7.7  HGB 9.8*  HCT 29.9*  PLT 219   BMET  Recent Labs  02/24/16 0500 02/25/16 0530  NA 132* 128*  K 3.7 4.2  CL 101 101  CO2 25 22  GLUCOSE 99 105*  BUN 7 11  CREATININE 0.62 0.66  CALCIUM 8.3* 7.6*   LFT  Recent Labs  02/25/16 0530  PROT 4.9*  ALBUMIN 2.3*  AST 11*  ALT 11*  ALKPHOS 59  BILITOT 0.4    Dg Abd 2 Views  Result Date: 02/23/2016 CLINICAL DATA:  Abdominal pain today EXAM: ABDOMEN - 2 VIEW COMPARISON:  02/20/2016 FINDINGS: There is no free intraperitoneal gas. Dilated small bowel loops with air-fluid levels have improved. There is no significant large bowel gas. Right lower quadrant ostomy site is noted. IMPRESSION: Improving small bowel  obstruction pattern. Electronically Signed   By: Marybelle Killings M.D.   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 Develped perirectal abscess on biologics for which is s/p I+D. Came to a diverting ileostomy then developed ileus / SBO. Plain films on 10/28 showed improving sbo. She is tolerating water. On TNA. Abdomen nondistended with good bowel sounds. Nausea controlled with antiemetics. Prednisone taper in progress, currently at 77m daily. Remicade on hold. No longer on antibiotics    LOS: 48 days   PTye Savoy 02/25/2016, 9:37 AM  Pager number [phone]    Attending physician's note   I have taken an interval history, reviewed the chart and examined the patient. I agree with the Advanced Practitioner's note, impression and recommendations. SBO/ileus is improving and she is tolerating liquids. TNA continues. Tapering Prednisone. Remicade on hold for now. Begin Tinidazole when she is able to take more PO. We will check back in a few days. Please call if needed in the interim.    MLucio Edward MD FMarval [phone]-Fri 8a-5p [phone] 5p, weekends, holidays

## 2016-02-25 NOTE — Progress Notes (Signed)
PT Cancellation Note  Patient Details Name: Sara Barnes MRN: [id number] DOB: 07/15/1940   Cancelled Treatment:    Reason Eval/Treat Not Completed: OT screened, no needs identified, will sign off . Attempted PT tx session. Pt and sister both declined participation with PT on today. Both stated PA advised pt to rest today. Will check back another day.    Weston Anna, MPT Pager: [phone]

## 2016-02-26 LAB — GLUCOSE, CAPILLARY
GLUCOSE-CAPILLARY: 112 mg/dL — AB (ref 65–99)
GLUCOSE-CAPILLARY: 114 mg/dL — AB (ref 65–99)
GLUCOSE-CAPILLARY: 126 mg/dL — AB (ref 65–99)
GLUCOSE-CAPILLARY: 139 mg/dL — AB (ref 65–99)
Glucose-Capillary: 138 mg/dL — ABNORMAL HIGH (ref 65–99)
Glucose-Capillary: 143 mg/dL — ABNORMAL HIGH (ref 65–99)
Glucose-Capillary: 150 mg/dL — ABNORMAL HIGH (ref 65–99)

## 2016-02-26 LAB — CBC
HCT: 31.8 % — ABNORMAL LOW (ref 36.0–46.0)
Hemoglobin: 10.4 g/dL — ABNORMAL LOW (ref 12.0–15.0)
MCH: 29.1 pg (ref 26.0–34.0)
MCHC: 32.7 g/dL (ref 30.0–36.0)
MCV: 89.1 fL (ref 78.0–100.0)
PLATELETS: 244 10*3/uL (ref 150–400)
RBC: 3.57 MIL/uL — ABNORMAL LOW (ref 3.87–5.11)
RDW: 14.9 % (ref 11.5–15.5)
WBC: 8.7 10*3/uL (ref 4.0–10.5)

## 2016-02-26 MED ORDER — FAT EMULSION 20 % IV EMUL
240.0000 mL | INTRAVENOUS | Status: DC
  Administered 2016-02-26: 240 mL via INTRAVENOUS
  Filled 2016-02-26: qty 250

## 2016-02-26 MED ORDER — TRACE MINERALS CR-CU-MN-SE-ZN 10-1000-500-60 MCG/ML IV SOLN
INTRAVENOUS | Status: DC
  Administered 2016-02-26: 17:00:00 via INTRAVENOUS
  Filled 2016-02-26: qty 1680

## 2016-02-26 NOTE — Progress Notes (Signed)
Patient ID: Sara Barnes, female   DOB: [birth year], 75 y.o.   MRN: [id number]  [facility] Surgery Progress Note  15 Days Post-Op  Subjective: Feeling worse today. Denies n/v but she has had increased abdominal pain and indigestion.   Objective: Vital signs in last 24 hours: Temp:  [98.4 F (36.9 C)-100.8 F (38.2 C)] 98.5 F (36.9 C) (10/31 0527) Pulse Rate:  [79-85] 85 (10/31 0527) Resp:  [18] 18 (10/31 0527) BP: (118-132)/(57-70) 118/70 (10/31 0527) SpO2:  [98 %-100 %] 98 % (10/31 0527) Last BM Date: 02/25/16  Intake/Output from previous day: 10/30 0701 - 10/31 0700 In: 3007.5 [P.O.:120; I.V.:2887.5] Out: 450 [Stool:450] Intake/Output this shift: No intake/output data recorded.  PE: Gen: Alert, NAD Card: RRR Pulm: CTAB, effort normal Abd: Soft, ND, NT, +BS, incisions C/D/I, RLQ ostomy pink with small amountliquid brown stool and air in bag  Lab Results:   Recent Labs  02/25/16 0530 02/26/16 0433  WBC 7.7 8.7  HGB 9.8* 10.4*  HCT 29.9* 31.8*  PLT 219 244   BMET  Recent Labs  02/24/16 0500 02/25/16 0530  NA 132* 128*  K 3.7 4.2  CL 101 101  CO2 25 22  GLUCOSE 99 105*  BUN 7 11  CREATININE 0.62 0.66  CALCIUM 8.3* 7.6*   PT/INR No results for input(s): LABPROT, INR in the last 72 hours. CMP     Component Value Date/Time   NA 128 (L) 02/25/2016 0530   NA 138 12/06/2014 0804   K 4.2 02/25/2016 0530   K 4.1 12/06/2014 0804   CL 101 02/25/2016 0530   CO2 22 02/25/2016 0530   CO2 25 12/06/2014 0804   GLUCOSE 105 (H) 02/25/2016 0530   GLUCOSE 92 12/06/2014 0804   BUN 11 02/25/2016 0530   BUN 12.3 12/06/2014 0804   CREATININE 0.66 02/25/2016 0530   CREATININE 0.8 12/06/2014 0804   CALCIUM 7.6 (L) 02/25/2016 0530   CALCIUM 9.4 12/06/2014 0804   PROT 4.9 (L) 02/25/2016 0530   PROT 6.5 12/06/2014 0804   ALBUMIN 2.3 (L) 02/25/2016 0530   ALBUMIN 3.3 (L) 12/06/2014 0804   AST 11 (L) 02/25/2016 0530   AST 9 12/06/2014 0804   ALT 11 (L)  02/25/2016 0530   ALT 10 12/06/2014 0804   ALKPHOS 59 02/25/2016 0530   ALKPHOS 89 12/06/2014 0804   BILITOT 0.4 02/25/2016 0530   BILITOT 0.37 12/06/2014 0804   GFRNONAA >60 02/25/2016 0530   GFRAA >60 02/25/2016 0530   Lipase     Component Value Date/Time   LIPASE 31 04/15/2011 1700       Studies/Results: No results found.  Anti-infectives: Anti-infectives    Start     Dose/Rate Route Frequency Ordered Stop   02/11/16 1600  piperacillin-tazobactam (ZOSYN) IVPB 3.375 g     3.375 g 100 mL/hr over 30 Minutes Intravenous Every 8 hours 02/11/16 1403 02/11/16 1614   02/11/16 1000  tinidazole (TINDAMAX) tablet 500 mg  Status:  Discontinued     500 mg Oral 2 times daily 02/08/16 1124 02/20/16 0908   02/10/16 1400  neomycin (MYCIFRADIN) tablet 1,000 mg     1,000 mg Oral 3 times daily 02/08/16 1603 02/10/16 2045   02/10/16 1400  metroNIDAZOLE (FLAGYL) tablet 500 mg  Status:  Discontinued     500 mg Oral 3 times daily 02/08/16 1603 02/10/16 0802   02/08/16 2000  metroNIDAZOLE (FLAGYL) tablet 500 mg  Status:  Discontinued     500 mg Oral Every  8 hours 02/08/16 1613 02/11/16 1500   02/02/16 1100  metroNIDAZOLE (FLAGYL) tablet 500 mg  Status:  Discontinued     500 mg Oral Every 12 hours 02/02/16 1024 02/08/16 1611   02/02/16 1100  ciprofloxacin (CIPRO) tablet 500 mg  Status:  Discontinued     500 mg Oral 2 times daily 02/02/16 1024 02/20/16 0908   01/31/16 1400  fluconazole (DIFLUCAN) tablet 200 mg  Status:  Discontinued     200 mg Oral  Once 01/31/16 1204 02/20/16 0908   01/31/16 0000  imipenem-cilastatin (PRIMAXIN) 500 mg in sodium chloride 0.9 % 100 mL IVPB  Status:  Discontinued     500 mg 200 mL/hr over 30 Minutes Intravenous Every 6 hours 01/30/16 1840 01/31/16 0957   01/30/16 1900  imipenem-cilastatin (PRIMAXIN) 500 mg in sodium chloride 0.9 % 100 mL IVPB     500 mg 200 mL/hr over 30 Minutes Intravenous STAT 01/30/16 1840 01/30/16 1956   01/29/16 2200  metroNIDAZOLE (FLAGYL)  tablet 500 mg  Status:  Discontinued     500 mg Oral Every 8 hours 01/29/16 1700 01/30/16 1829   01/29/16 2000  ciprofloxacin (CIPRO) tablet 500 mg  Status:  Discontinued     500 mg Oral 2 times daily 01/29/16 1700 01/30/16 1829   01/22/16 0900  piperacillin-tazobactam (ZOSYN) IVPB 3.375 g  Status:  Discontinued     3.375 g 12.5 mL/hr over 240 Minutes Intravenous Every 8 hours 01/22/16 0840 01/29/16 1700   01/21/16 1830  ciprofloxacin (CIPRO) IVPB 400 mg  Status:  Discontinued     400 mg 200 mL/hr over 60 Minutes Intravenous Every 12 hours 01/21/16 1744 01/22/16 0840   01/21/16 1830  metroNIDAZOLE (FLAGYL) IVPB 500 mg  Status:  Discontinued     500 mg 100 mL/hr over 60 Minutes Intravenous Every 6 hours 01/21/16 1744 01/22/16 0840       Assessment/Plan Crohns  - taperingprednisone and Remicade on hold per GI - per GI begin Tinidazole when she is able to take more PO 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 Hassell Done - POD15 - ileostomy functioning - increased abdominal pain and indigestion today, denies n/v - some low grade fevers yesterday, WBC 8.7  ABL anemia -hg 10.4, stable Malnourished - prealbumin 15.6 (02/22/16). TNA started 10/28 Chronic pain Anxiety - prozac   ID - cipro 10/7>>10/25, tinidazole 10/16>>10/25 FEN - full liquids, TNA VTE - heparin, SCDs  Plan - worsening abdominal pain and indigestion today, denies n/v. On bentyl, zofran, simethicone. Tolerated less by mouth yesterday. Ostomy functioning, abdomen soft with good BS. Continue full liquids and TPN, mobilization. Encourage Is. Encourage diet. Continue local wound care as before.    LOS: 96 days    [facility] 02/26/2016, 10:00 AM Pager: [phone] Consults: [phone] Mon-Fri 7:00 am-4:30 pm Sat-Sun 7:00 am-11:30 am

## 2016-02-26 NOTE — Consult Note (Signed)
[facility] Nurse ostomy follow up Stoma type/location: RUQ ileostomy Stomal assessment/size: 1 and 3/8 inches round. Proximal limb at top.  Stoma is red, moist, budded Peristomal assessment: intact, clear Treatment options for stomal/peristomal skin: skin barrier ring Output brown thin effluent Ostomy pouching: 1pc.convex pouch with skin barrier ring Education provided: Patient's sister changed ostomy pouch with stand-by and minimal corrective assistance.  Will require another session to be independent. Supplies in room.  Next pouch change is Friday, 11/3.   Enrolled patient in [place] Start Discharge program: Yes [place] nursing team will follow, and will remain available to this patient, the nursing, surgical and medical teams.  Thanks, Maudie Flakes, MSN, RN, [place], Arther Abbott  Pager# [phone]

## 2016-02-26 NOTE — Progress Notes (Signed)
Calorie Count Note  48 hour calorie count ordered. Day 1 results below.  Diet: Full Liquid Diet Supplements:  -Pro-Stat 30 ml 5 times daily. -Boost Breeze TID PRN, each supplement provides 250 kcal and 9 grams protein.  Intake 10/30-10/31:  Breakfast 10/31: patient missed meal Lunch 10/30: 40 kcal, 1 grams protein (2 oz of grape juice, bites of gelatin) Dinner 10/30: patient missed meal Supplements: patient refusing   Total intake: 40 kcal (3% of minimum estimated needs)  1 gram protein (1% of minimum estimated needs)  Estimated Nutritional Needs:  Kcal:  1500-1730 (MSJ x 1.3-1.5) Protein:  88-95 grams (1.4-1.5 grams/kg) Fluid:  1.5-1.7 L/day  Nutrition Dx: Malnutrition (Severe) related to acute illness as evidenced by percent weight loss, energy intake < or equal to 50% for > or equal to 5 days.  Goal: Patient will meet greater than or equal to 90% of their needs  Intervention:  -Continue Calorie Count. -TPN per pharmacy. Recommended supplement regimen: -8 oz Grape Juice mixed with Pro-Stat 30 ml 5 times daily. Each grape juice provides approximately 150 kcal and each Pro-Stat provides 100 kcal and 15 grams protein. -Lactaid milk 3 times daily, each container provides 90 kcal and 8 grams protein. If patient is able to drink all of these will have 1520 kcal and 99 grams protein daily. -If patient won't accept Prostat supplements, provide Boost Breeze supplement providing 250 kcal and 9g protein. -RD continues to recommend placement of NG tube and initiation of tube feeding at this time d/t patient's tolerance of liquids and national shortage of Clinimix.  Willey Blade, MS, RD, LDN Pager: [phone] After Hours Pager: [phone]

## 2016-02-26 NOTE — Progress Notes (Signed)
PHARMACY - ADULT TOTAL PARENTERAL NUTRITION CONSULT NOTE   Pharmacy Consult for TPN Indication: Prolonged ileus, not tolerating PO  Patient Measurements: Height: 5' 6"  (167.6 cm) Weight: 130 lb (59 kg) IBW/kg (Calculated) : 59.3   Body mass index is 20.98 kg/m. Usual Weight: 73 kg  Insulin Requirements: none  Current Nutrition: advanced to FL diet 10/30 Clinimix E 5/15 at 70 ml/hr + 20% fat emulsion at 10 ml/hr    IVF: NS @ 50 ml/hr  Central access: PICC placed 10/24 TPN start date: 10/28  ASSESSMENT                                                                                                          HPI: 63 yoF with Crohn's admitted 6/72 complicated by perirectal abscesses. Underwent diverting ileostomy 10/17 but failed to tolerate PO diet. Now to start TPN per pharmacy d/t persistent ileus. Most recent AXR shows diffusely dilated small bowel. Patient has also had issues with nausea and copious emesis. Previously was considering inserting NG tube but per surgery, this is no longer a feasible option.  Significant events:  10/29: changed to double lumen PICC 10/31: FL diet but abd pain worse reportedly worse and PO intake less  Today:  Glucose - No Hx DM; CBGs at goal <150  Electrolytes - WNL 10/30 except Na 128 (trending down).  Goal K>4 & Mag>2 with ileus.   Renal - SCr wnl; CrCl ~58 ml/min  LFTs - TBili WNL, others low  TGs - 152 (10/27), 157 (10/30)  Prealbumin - 15.6 (10/27), 13.4 (10/30)  NUTRITIONAL GOALS                                                                                             RD recs: Kcal:  1500-1730 (MSJ x 1.3-1.5) Protein:  88-95 grams (1.4-1.5 grams/kg) Fluid:  1.5-1.7 L/day  Clinimix E 5/15 at a goal rate of 70 ml/hr + 20% fat emulsion at 10 ml/hr to provide: 84 g/day protein, 1672 Kcal/day.  PLAN  At 1800  today:  Continue Clinimix E 5/15 at goal rate of 70 ml/hr.   Continue 20% fat emulsion at 10 ml/hr.  TPN to contain standard multivitamins and trace elements.  Continue IVF at 50 ml/hr  Continue sensitive SSI as ordered.   TPN lab panels on Mondays & Thursdays.  Re-evaluate daily for PO tolerance and ability to wean TPN to preserve extremely limited supply of Clinimix due to [employer].  Clayburn Pert, PharmD, BCPS Pager: [phone] 02/26/2016  10:48 AM

## 2016-02-26 NOTE — Progress Notes (Signed)
OT Cancellation Note  Patient Details Name: Sara Barnes MRN: [id number] DOB: July 25, 1940   Cancelled Treatment:    Reason Eval/Treat Not Completed: Other (comment).  Pt states she has had a few rough days; wants OT to return another day.  Wilkie Zenon 02/26/2016, 2:46 PM  Lesle Chris, OTR/L [phone] 02/26/2016

## 2016-02-27 DIAGNOSIS — K219 Gastro-esophageal reflux disease without esophagitis: Secondary | ICD-10-CM

## 2016-02-27 LAB — BASIC METABOLIC PANEL
ANION GAP: 5 (ref 5–15)
BUN: 13 mg/dL (ref 6–20)
CHLORIDE: 104 mmol/L (ref 101–111)
CO2: 21 mmol/L — AB (ref 22–32)
Calcium: 7.9 mg/dL — ABNORMAL LOW (ref 8.9–10.3)
Creatinine, Ser: 0.58 mg/dL (ref 0.44–1.00)
GFR calc non Af Amer: >60 mL/min (ref >60)
Glucose, Bld: 101 mg/dL — ABNORMAL HIGH (ref 65–99)
POTASSIUM: 4.1 mmol/L (ref 3.5–5.1)
SODIUM: 130 mmol/L — AB (ref 135–145)

## 2016-02-27 LAB — GLUCOSE, CAPILLARY
GLUCOSE-CAPILLARY: 111 mg/dL — AB (ref 65–99)
GLUCOSE-CAPILLARY: 108 mg/dL — AB (ref 65–99)
Glucose-Capillary: 118 mg/dL — ABNORMAL HIGH (ref 65–99)
Glucose-Capillary: 96 mg/dL (ref 65–99)

## 2016-02-27 MED ORDER — FAMOTIDINE 20 MG PO TABS
40.0000 mg | ORAL_TABLET | Freq: Two times a day (BID) | ORAL | Status: DC
  Administered 2016-02-27 – 2016-02-29 (×5): 40 mg via ORAL
  Filled 2016-02-27 (×5): qty 2

## 2016-02-27 MED ORDER — FAT EMULSION 20 % IV EMUL: 80.0000 mL | INTRAVENOUS | Status: AC

## 2016-02-27 MED ORDER — TRACE MINERALS CR-CU-MN-SE-ZN 10-1000-500-60 MCG/ML IV SOLN: INTRAVENOUS | Status: AC

## 2016-02-27 NOTE — Progress Notes (Signed)
Patient ID: Sara Barnes, female   DOB: 11-05-1940, 75 y.o.   MRN: [id number]  [facility] Surgery Progress Note  16 Days Post-Op  Subjective: Feeling much better than yesterday morning. Tolerated 1/4 PB&J sandwich, 2 crackers, and lactaid yesterday. Ate some cereal and lactaid this morning. She did have a little indigestion yesterday, but no n/v.  Objective: Vital signs in last 24 hours: Temp:  [97.4 F (36.3 C)-98.3 F (36.8 C)] 98.3 F (36.8 C) (11/01 0446) Pulse Rate:  [70-78] 75 (11/01 0446) Resp:  [18] 18 (11/01 0446) BP: (117-135)/(60-73) 135/60 (11/01 0446) SpO2:  [97 %-99 %] 99 % (11/01 0446) Last BM Date: 02/26/16  Intake/Output from previous day: No intake/output data recorded. Intake/Output this shift: No intake/output data recorded.  PE: Gen: Alert, NAD Card: RRR Pulm: CTAB, effort normal Abd: Soft, ND, NT, +BS, incisions C/D/I, RLQ ostomy pink with small amountliquid brown stool and air in bag  Lab Results:   Recent Labs  02/25/16 0530 02/26/16 0433  WBC 7.7 8.7  HGB 9.8* 10.4*  HCT 29.9* 31.8*  PLT 219 244   BMET  Recent Labs  02/25/16 0530  NA 128*  K 4.2  CL 101  CO2 22  GLUCOSE 105*  BUN 11  CREATININE 0.66  CALCIUM 7.6*   PT/INR No results for input(s): LABPROT, INR in the last 72 hours. CMP     Component Value Date/Time   NA 128 (L) 02/25/2016 0530   NA 138 12/06/2014 0804   K 4.2 02/25/2016 0530   K 4.1 12/06/2014 0804   CL 101 02/25/2016 0530   CO2 22 02/25/2016 0530   CO2 25 12/06/2014 0804   GLUCOSE 105 (H) 02/25/2016 0530   GLUCOSE 92 12/06/2014 0804   BUN 11 02/25/2016 0530   BUN 12.3 12/06/2014 0804   CREATININE 0.66 02/25/2016 0530   CREATININE 0.8 12/06/2014 0804   CALCIUM 7.6 (L) 02/25/2016 0530   CALCIUM 9.4 12/06/2014 0804   PROT 4.9 (L) 02/25/2016 0530   PROT 6.5 12/06/2014 0804   ALBUMIN 2.3 (L) 02/25/2016 0530   ALBUMIN 3.3 (L) 12/06/2014 0804   AST 11 (L) 02/25/2016 0530   AST 9 12/06/2014  0804   ALT 11 (L) 02/25/2016 0530   ALT 10 12/06/2014 0804   ALKPHOS 59 02/25/2016 0530   ALKPHOS 89 12/06/2014 0804   BILITOT 0.4 02/25/2016 0530   BILITOT 0.37 12/06/2014 0804   GFRNONAA >60 02/25/2016 0530   GFRAA >60 02/25/2016 0530   Lipase     Component Value Date/Time   LIPASE 31 04/15/2011 1700       Studies/Results: No results found.  Anti-infectives: Anti-infectives    Start     Dose/Rate Route Frequency Ordered Stop   02/11/16 1600  piperacillin-tazobactam (ZOSYN) IVPB 3.375 g     3.375 g 100 mL/hr over 30 Minutes Intravenous Every 8 hours 02/11/16 1403 02/11/16 1614   02/11/16 1000  tinidazole (TINDAMAX) tablet 500 mg  Status:  Discontinued     500 mg Oral 2 times daily 02/08/16 1124 02/20/16 0908   02/10/16 1400  neomycin (MYCIFRADIN) tablet 1,000 mg     1,000 mg Oral 3 times daily 02/08/16 1603 02/10/16 2045   02/10/16 1400  metroNIDAZOLE (FLAGYL) tablet 500 mg  Status:  Discontinued     500 mg Oral 3 times daily 02/08/16 1603 02/10/16 0802   02/08/16 2000  metroNIDAZOLE (FLAGYL) tablet 500 mg  Status:  Discontinued     500 mg Oral Every 8  hours 02/08/16 1613 02/11/16 1500   02/02/16 1100  metroNIDAZOLE (FLAGYL) tablet 500 mg  Status:  Discontinued     500 mg Oral Every 12 hours 02/02/16 1024 02/08/16 1611   02/02/16 1100  ciprofloxacin (CIPRO) tablet 500 mg  Status:  Discontinued     500 mg Oral 2 times daily 02/02/16 1024 02/20/16 0908   01/31/16 1400  fluconazole (DIFLUCAN) tablet 200 mg  Status:  Discontinued     200 mg Oral  Once 01/31/16 1204 02/20/16 0908   01/31/16 0000  imipenem-cilastatin (PRIMAXIN) 500 mg in sodium chloride 0.9 % 100 mL IVPB  Status:  Discontinued     500 mg 200 mL/hr over 30 Minutes Intravenous Every 6 hours 01/30/16 1840 01/31/16 0957   01/30/16 1900  imipenem-cilastatin (PRIMAXIN) 500 mg in sodium chloride 0.9 % 100 mL IVPB     500 mg 200 mL/hr over 30 Minutes Intravenous STAT 01/30/16 1840 01/30/16 1956   01/29/16 2200   metroNIDAZOLE (FLAGYL) tablet 500 mg  Status:  Discontinued     500 mg Oral Every 8 hours 01/29/16 1700 01/30/16 1829   01/29/16 2000  ciprofloxacin (CIPRO) tablet 500 mg  Status:  Discontinued     500 mg Oral 2 times daily 01/29/16 1700 01/30/16 1829   01/22/16 0900  piperacillin-tazobactam (ZOSYN) IVPB 3.375 g  Status:  Discontinued     3.375 g 12.5 mL/hr over 240 Minutes Intravenous Every 8 hours 01/22/16 0840 01/29/16 1700   01/21/16 1830  ciprofloxacin (CIPRO) IVPB 400 mg  Status:  Discontinued     400 mg 200 mL/hr over 60 Minutes Intravenous Every 12 hours 01/21/16 1744 01/22/16 0840   01/21/16 1830  metroNIDAZOLE (FLAGYL) IVPB 500 mg  Status:  Discontinued     500 mg 100 mL/hr over 60 Minutes Intravenous Every 6 hours 01/21/16 1744 01/22/16 0840       Assessment/Plan Crohns  - taperingprednisone and Remicade on hold per GI - per GI begin Tinidazole when she is able to take more PO 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 Hassell Done - POD16 - ileostomy functioning - tolerating a little more of her soft diet  ABL anemia -hg 10.4, stable Malnourished - prealbumin 15.6 (02/22/16). TNA 10/28>>11/1 Chronic pain Anxiety - prozac   ID - cipro 10/7>>10/25, tinidazole 10/16>>10/25 FEN - soft diet VTE - heparin, SCDs  Plan - no n/v, tolerating soft diet (slowly tolerating more). Will d/c TPN. Continue to encourage diet. Continue bentyl, zofran, simethicone. Will keep IV pain medication for today and likely transition to PO meds if tolerating more PO. Continue to encourage mobilization. Continue local wound care as before.    LOS: 50 days    Jerrye Beavers , [facility] 02/27/2016, 8:55 AM Pager: [phone] Consults: [phone] Mon-Fri 7:00 am-4:30 pm Sat-Sun 7:00 am-11:30 am

## 2016-02-27 NOTE — Progress Notes (Signed)
Calorie Count Note  48 hour calorie count ordered. Day 2 results below.  Diet: Full Liquid Diet Supplements:  -Pro-Stat 30 ml 5 times daily. -Boost Breeze TID PRN, each supplement provides 250 kcal and 9 grams protein.  Intake 10/31-11/1:  Breakfast 11/1: 474 kcal, 20g protein Lunch 10/31: 169 kcal, 8g protein  Dinner 10/31: patient missed meal Supplements: patient refusing   Total intake: 643 kcal (43% of minimum estimated needs)  28 grams protein (32% of minimum estimated needs)  Estimated Nutritional Needs: Kcal:1500-1730 (MSJ x 1.3-1.5) Protein:88-95 grams (1.4-1.5 grams/kg) Fluid:1.5-1.7 L/day  Nutrition Dx: Malnutrition (Severe)related to acute illnessas evidenced by percent weight loss, energy intake < or equal to 50% for > or equal to 5 days.  Goal: Patient will meet greater than or equal to 90% of their needs  Intervention:  -D/c Calorie Count.  -TPN per pharmacy, turning off today 11/1 Recommended supplement regimen: -8 oz Grape Juice mixed with Pro-Stat 30 ml 5 times daily. Each grape juice provides approximately 150 kcal and each Pro-Stat provides 100 kcal and 15 grams protein. -Lactaid milk 3 times daily, each container provides 90 kcal and 8 grams protein. If patient is able to drink all of these will have 1520 kcal and 99 grams protein daily. -If patient won't accept Prostat supplements, provide Boost Breeze supplement providing 250 kcal and 9g protein. -RD continues to recommend placement of NG tube and initiation of tube feeding at thistime d/t patient's tolerance of liquids and national shortage of Clinimix.  Clayton Bibles, MS, RD, LDN Pager: [phone] After Hours Pager: [phone]

## 2016-02-27 NOTE — Progress Notes (Signed)
Physical Therapy Treatment Patient Details Name: Sara Barnes MRN: [id number] DOB: 08/20/1940 Today's Date: 02/27/2016    History of Present Illness 75 yo female s/p end ileostomy 10/17    PT Comments    Pt OOB in recliner with her very helpful sister in room.  Assisted with amb in hallway using RW for increased safety/stability.  Limited activity tolerance and unstable gait.  Pt will need ST Rehab at SNF prior to returning home.   Follow Up Recommendations  SNF     Equipment Recommendations  Rolling walker with 5" wheels    Recommendations for Other Services       Precautions / Restrictions Precautions Precautions: Fall Precaution Comments: has fallen x 2 in hospital, pt denies prior falls at home; Restrictions Weight Bearing Restrictions: No    Mobility  Bed Mobility Overal bed mobility: Needs Assistance Bed Mobility: Sit to Supine       Sit to supine: Min guard;Min assist   General bed mobility comments: assisted back to bed B LE's  Transfers Overall transfer level: Needs assistance Equipment used: None Transfers: Sit to/from Stand Sit to Stand: Min guard;Min assist Stand pivot transfers: Min guard;Min assist       General transfer comment: one VC proper hand placement and increased time to rise and sit due to pain (ABD)  Ambulation/Gait Ambulation/Gait assistance: Min guard;Min assist Ambulation Distance (Feet): 155 Feet Assistive device: Rolling walker (2 wheeled) Gait Pattern/deviations: Step-to pattern;Step-through pattern;Trunk flexed Gait velocity: decreased   General Gait Details: pt normally does not use a walker however due to recent medical used a walker for increased support/balance/increased distance.    Stairs            Wheelchair Mobility    Modified Rankin (Stroke Patients Only)       Balance                                    Cognition Arousal/Alertness: Awake/alert Behavior During Therapy: WFL for  tasks assessed/performed Overall Cognitive Status: Within Functional Limits for tasks assessed                      Exercises      General Comments        Pertinent Vitals/Pain Pain Assessment: Faces Faces Pain Scale: Hurts even more Pain Location: ABD Pain Descriptors / Indicators: Sore Pain Intervention(s): Monitored during session    Home Living                      Prior Function            PT Goals (current goals can now be found in the care plan section) Progress towards PT goals: Progressing toward goals    Frequency    Min 3X/week      PT Plan Current plan remains appropriate    Co-evaluation             End of Session Equipment Utilized During Treatment: Gait belt Activity Tolerance: Patient tolerated treatment well Patient left: in bed;with call bell/phone within reach;with family/visitor present;with bed alarm set     Time: 1100-1115 PT Time Calculation (min) (ACUTE ONLY): 15 min  Charges:  $Gait Training: 8-22 mins                    G Codes:      Rica Koyanagi  PTA  [facility]  Acute  Rehab Pager      [phone]

## 2016-02-27 NOTE — Progress Notes (Signed)
     [facility] Progress Note  Subjective:  Feeling a little better today than she has for the past 2 days.  Is going to start doing some walking again today.  Objective:  Vital signs in last 24 hours: Temp:  [97.4 F (36.3 C)-98.3 F (36.8 C)] 98.3 F (36.8 C) (11/01 0446) Pulse Rate:  [70-78] 75 (11/01 0446) Resp:  [18] 18 (11/01 0446) BP: (117-135)/(60-73) 135/60 (11/01 0446) SpO2:  [97 %-99 %] 99 % (11/01 0446) Last BM Date: 02/26/16   General:  Alert, Well-developed, in NAD Heart:  Regular rate and rhythm; no murmurs Pulm:  CTAB.  No W/R/R. Abdomen:  Soft, non-distended.  BS present.  Mild diffuse TTP.  Ileostomy noted on right side of abdomen with small amount of light brown stool contents. Extremities:  Without edema. Neurologic:  Alert and oriented x 4;  grossly normal neurologically.  Psych:  Alert and cooperative. Normal mood and affect.  Lab Results:  Recent Labs  02/25/16 0530 02/26/16 0433  WBC 7.7 8.7  HGB 9.8* 10.4*  HCT 29.9* 31.8*  PLT 219 244   BMET  Recent Labs  02/25/16 0530  NA 128*  K 4.2  CL 101  CO2 22  GLUCOSE 105*  BUN 11  CREATININE 0.66  CALCIUM 7.6*   LFT  Recent Labs  02/25/16 0530  PROT 4.9*  ALBUMIN 2.3*  AST 11*  ALT 11*  ALKPHOS 59  BILITOT 0.4   Assessment / Plan: *75 year old female with Crohn's colitis complicated by perirectal abscess/fistula. Develped perirectal abscess on biologics for which is s/p I+D. Came to a diverting ileostomy then developed ileus/SBO.  On TNA now but planning to D/C this soon per surgery.  Tolerating small amounts of food.  Prednisone taper in progress, currently at 15 mg daily. Remicade on hold. No longer on antibiotics, but should restart PO Tinidazole when taking more PO.  Will start Pepcid 40 mg BID for complaints of indigestion/heartburn.   LOS: 50 days   ZEHR, JESSICA D.  02/27/2016, 9:00 AM  Pager number [phone]     Attending physician's note   I have taken  an interval history, reviewed the chart and examined the patient. I agree with the Advanced Practitioner's note, impression and recommendations. Start Tinidazole PO soon. Decision on Remicade will be deferred to Dr Hilarie Fredrickson in outpatient setting. Start Pepcid bid for GERD symptoms.   Lucio Edward, MD [name] [phone] Mon-Fri 8a-5p [phone] after 5p, weekends, holidays

## 2016-02-27 NOTE — Progress Notes (Signed)
PHARMACY - ADULT TOTAL PARENTERAL NUTRITION CONSULT NOTE   Pharmacy Consult for TPN Indication: Prolonged ileus, not tolerating PO  Patient Measurements: Height: 5' 6"  (167.6 cm) Weight: 130 lb (59 kg) IBW/kg (Calculated) : 59.3   Body mass index is 20.98 kg/m. Usual Weight: 73 kg  Insulin Requirements: none  Current Nutrition: advanced to soft diet 10/30 Clinimix E 5/15 at 70 ml/hr + 20% fat emulsion at 10 ml/hr   IVF: NS @ 50 ml/hr  Central access: PICC placed 10/24 TPN start date: 10/28  ASSESSMENT                                                                                                          HPI: 56 yoF with Crohn's admitted 7/10 complicated by perirectal abscesses. Underwent diverting ileostomy 10/17 but failed to tolerate PO diet. Now to start TPN per pharmacy d/t persistent ileus. Most recent AXR shows diffusely dilated small bowel. Patient has also had issues with nausea and copious emesis. Previously was considering inserting NG tube but per surgery, this is no longer a feasible option.  Significant events:  10/29: changed to double lumen PICC 10/31: FL diet but abd pain worse reportedly worse and PO intake less 11/1: slowly tolerating more soft diet. Surgery stopping TPN and pushing PO intake  PLAN                                                                                                                     Continue TPN at 70 ml/hr until 1800 today, then stop   Reuel Boom, PharmD, BCPS Pager: [phone] 02/27/2016, 9:48 AM

## 2016-02-28 LAB — GLUCOSE, CAPILLARY
GLUCOSE-CAPILLARY: 121 mg/dL — AB (ref 65–99)
Glucose-Capillary: 84 mg/dL (ref 65–99)
Glucose-Capillary: 70 mg/dL (ref 65–99)
Glucose-Capillary: 97 mg/dL (ref 65–99)

## 2016-02-28 MED ORDER — BOOST / RESOURCE BREEZE PO LIQD
1.0000 | Freq: Three times a day (TID) | ORAL | Status: DC
  Administered 2016-02-28 – 2016-02-29 (×2): 1 via ORAL

## 2016-02-28 MED ORDER — TINIDAZOLE 500 MG PO TABS
500.0000 mg | ORAL_TABLET | Freq: Two times a day (BID) | ORAL | Status: DC
  Administered 2016-02-28 – 2016-02-29 (×3): 500 mg via ORAL
  Filled 2016-02-28 (×3): qty 1

## 2016-02-28 MED ORDER — CYANOCOBALAMIN 1000 MCG/ML IJ SOLN: 1000.0000 ug | Freq: Once | INTRAMUSCULAR | Status: DC

## 2016-02-28 MED ORDER — CYANOCOBALAMIN 1000 MCG/ML IJ SOLN
1000.0000 ug | Freq: Once | INTRAMUSCULAR | Status: AC
  Administered 2016-02-29: 1000 ug via INTRAMUSCULAR
  Filled 2016-02-28: qty 1

## 2016-02-28 MED ORDER — PREDNISONE 10 MG PO TABS
10.0000 mg | ORAL_TABLET | Freq: Every day | ORAL | Status: DC
  Administered 2016-02-28 – 2016-02-29 (×2): 10 mg via ORAL
  Filled 2016-02-28 (×2): qty 1

## 2016-02-28 NOTE — Care Management Important Message (Signed)
Important Message  Patient Details  Name: Sara Barnes MRN: [id number] Date of Birth: February 10, 1941   Medicare Important Message Given:  Yes    [name] 02/28/2016, 11:07 AMImportant Message  Patient Details  Name: Sara Barnes MRN: [id number] Date of Birth: 1940-07-03   Medicare Important Message Given:  Yes    [name] 02/28/2016, 11:07 AM

## 2016-02-28 NOTE — Progress Notes (Signed)
Patient ID: Sara Barnes, female   DOB: 1940-05-21, 75 y.o.   MRN: [id number]  [facility] Surgery Progress Note  17 Days Post-Op  Subjective: Had a better day yesterday. Tolerated cereal, 2 lactaids, grilled cheese, and crackers. Excited about having a biscuit this morning. Pain is improving. Less indigestion yesterday. Denies n/v.  Objective: Vital signs in last 24 hours: Temp:  [97.7 F (36.5 C)-98.8 F (37.1 C)] 97.7 F (36.5 C) (11/02 0451) Pulse Rate:  [64-81] 71 (11/02 0451) Resp:  [18] 18 (11/02 0451) BP: (95-124)/(51-57) 110/57 (11/02 0451) SpO2:  [98 %-100 %] 100 % (11/02 0451) Last BM Date: 02/27/16  Intake/Output from previous day: 11/01 0701 - 11/02 0700 In: 1280 [P.O.:240; I.V.:1040] Out: 150 [Stool:150] Intake/Output this shift: No intake/output data recorded.  PE: Gen: Alert, NAD Card: RRR Pulm: CTAB, effort normal Abd: Soft, ND, NT, +BS, incisions C/D/I, RLQ ostomy pink with small amountliquid brown stool and air in bag Perineum: penrose drain in place with no purulent drainage, mild erythema   Lab Results:   Recent Labs  02/26/16 0433  WBC 8.7  HGB 10.4*  HCT 31.8*  PLT 244   BMET  Recent Labs  02/27/16 0955  NA 130*  K 4.1  CL 104  CO2 21*  GLUCOSE 101*  BUN 13  CREATININE 0.58  CALCIUM 7.9*   PT/INR No results for input(s): LABPROT, INR in the last 72 hours. CMP     Component Value Date/Time   NA 130 (L) 02/27/2016 0955   NA 138 12/06/2014 0804   K 4.1 02/27/2016 0955   K 4.1 12/06/2014 0804   CL 104 02/27/2016 0955   CO2 21 (L) 02/27/2016 0955   CO2 25 12/06/2014 0804   GLUCOSE 101 (H) 02/27/2016 0955   GLUCOSE 92 12/06/2014 0804   BUN 13 02/27/2016 0955   BUN 12.3 12/06/2014 0804   CREATININE 0.58 02/27/2016 0955   CREATININE 0.8 12/06/2014 0804   CALCIUM 7.9 (L) 02/27/2016 0955   CALCIUM 9.4 12/06/2014 0804   PROT 4.9 (L) 02/25/2016 0530   PROT 6.5 12/06/2014 0804   ALBUMIN 2.3 (L) 02/25/2016 0530   ALBUMIN 3.3 (L) 12/06/2014 0804   AST 11 (L) 02/25/2016 0530   AST 9 12/06/2014 0804   ALT 11 (L) 02/25/2016 0530   ALT 10 12/06/2014 0804   ALKPHOS 59 02/25/2016 0530   ALKPHOS 89 12/06/2014 0804   BILITOT 0.4 02/25/2016 0530   BILITOT 0.37 12/06/2014 0804   GFRNONAA >60 02/27/2016 0955   GFRAA >60 02/27/2016 0955   Lipase     Component Value Date/Time   LIPASE 31 04/15/2011 1700       Studies/Results: No results found.  Anti-infectives: Anti-infectives    Start     Dose/Rate Route Frequency Ordered Stop   02/11/16 1600  piperacillin-tazobactam (ZOSYN) IVPB 3.375 g     3.375 g 100 mL/hr over 30 Minutes Intravenous Every 8 hours 02/11/16 1403 02/11/16 1614   02/11/16 1000  tinidazole (TINDAMAX) tablet 500 mg  Status:  Discontinued     500 mg Oral 2 times daily 02/08/16 1124 02/20/16 0908   02/10/16 1400  neomycin (MYCIFRADIN) tablet 1,000 mg     1,000 mg Oral 3 times daily 02/08/16 1603 02/10/16 2045   02/10/16 1400  metroNIDAZOLE (FLAGYL) tablet 500 mg  Status:  Discontinued     500 mg Oral 3 times daily 02/08/16 1603 02/10/16 0802   02/08/16 2000  metroNIDAZOLE (FLAGYL) tablet 500 mg  Status:  Discontinued     500 mg Oral Every 8 hours 02/08/16 1613 02/11/16 1500   02/02/16 1100  metroNIDAZOLE (FLAGYL) tablet 500 mg  Status:  Discontinued     500 mg Oral Every 12 hours 02/02/16 1024 02/08/16 1611   02/02/16 1100  ciprofloxacin (CIPRO) tablet 500 mg  Status:  Discontinued     500 mg Oral 2 times daily 02/02/16 1024 02/20/16 0908   01/31/16 1400  fluconazole (DIFLUCAN) tablet 200 mg  Status:  Discontinued     200 mg Oral  Once 01/31/16 1204 02/20/16 0908   01/31/16 0000  imipenem-cilastatin (PRIMAXIN) 500 mg in sodium chloride 0.9 % 100 mL IVPB  Status:  Discontinued     500 mg 200 mL/hr over 30 Minutes Intravenous Every 6 hours 01/30/16 1840 01/31/16 0957   01/30/16 1900  imipenem-cilastatin (PRIMAXIN) 500 mg in sodium chloride 0.9 % 100 mL IVPB     500 mg 200 mL/hr  over 30 Minutes Intravenous STAT 01/30/16 1840 01/30/16 1956   01/29/16 2200  metroNIDAZOLE (FLAGYL) tablet 500 mg  Status:  Discontinued     500 mg Oral Every 8 hours 01/29/16 1700 01/30/16 1829   01/29/16 2000  ciprofloxacin (CIPRO) tablet 500 mg  Status:  Discontinued     500 mg Oral 2 times daily 01/29/16 1700 01/30/16 1829   01/22/16 0900  piperacillin-tazobactam (ZOSYN) IVPB 3.375 g  Status:  Discontinued     3.375 g 12.5 mL/hr over 240 Minutes Intravenous Every 8 hours 01/22/16 0840 01/29/16 1700   01/21/16 1830  ciprofloxacin (CIPRO) IVPB 400 mg  Status:  Discontinued     400 mg 200 mL/hr over 60 Minutes Intravenous Every 12 hours 01/21/16 1744 01/22/16 0840   01/21/16 1830  metroNIDAZOLE (FLAGYL) IVPB 500 mg  Status:  Discontinued     500 mg 100 mL/hr over 60 Minutes Intravenous Every 6 hours 01/21/16 1744 01/22/16 0840       Assessment/Plan Crohns  - taperingprednisone and Remicade on hold per GI - per GI begin Tinidazole when she is able to take more PO 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 Hassell Done - POD17 - ileostomy functioning - still with limited PO intake but appetite is improving (see above)  ABL anemia -hg 10.4, stable Malnourished - prealbumin 15.6 (02/22/16). TNA 10/28>>11/1 Chronic pain Anxiety - prozac   ID - cipro 10/7>>10/25, tinidazole 10/16>>10/25 FEN - soft diet VTE - heparin, SCDs  Plan - PO intake and appetite improving. Continue soft diet, encourage fluids to avoid dehydration. Continue bentyl, zofran, simethicone. Use PO pain medication today and less IV. Continue to encourage mobilization. Continue local wound care as before. Hopefully home in next 1-2 days if she continues to progress. May need PICC at home if unable to tolerate enough fluids Po. Will need follow-up with Dr. Hassell Done in about 1 week.   LOS: 51 days    Jerrye Beavers , [facility] Surgery 02/28/2016, 7:43 AM Pager: [phone] Consults: [phone] Mon-Fri 7:00 am-4:30 pm Sat-Sun 7:00 am-11:30 am

## 2016-02-28 NOTE — Progress Notes (Signed)
Occupational Therapy Treatment Patient Details Name: Sara Barnes MRN: [id number] DOB: 05/17/1940 Today's Date: 02/28/2016    History of present illness 75 yo female s/p end ileostomy 10/17   OT comments  Pt making progress!  Pt able to don socks this day. Encouraged pt and sister to let me be more I with simple ADL activity  Follow Up Recommendations  SNF;Supervision/Assistance - 24 hour    Equipment Recommendations  None recommended by OT    Recommendations for Other Services      Precautions / Restrictions Precautions Precautions: Fall Precaution Comments: has fallen x 2 in hospital, pt denies prior falls at home; Restrictions Weight Bearing Restrictions: No       Mobility Bed Mobility Overal bed mobility: Needs Assistance Bed Mobility: Sit to Supine     Supine to sit: Supervision Sit to supine: Min guard      Transfers Overall transfer level: Needs assistance Equipment used: Rolling walker (2 wheeled) Transfers: Sit to/from Stand Sit to Stand: Min guard Stand pivot transfers: Min guard                ADL Overall ADL's : Needs assistance/impaired                     Lower Body Dressing: Minimal assistance;Sit to/from stand Lower Body Dressing Details (indicate cue type and reason): donning socks- needed encouragement to performe herself rather than sister performing Toilet Transfer: Min guard;Comfort height toilet;Ambulation;RW   Toileting- Clothing Manipulation and Hygiene: Minimal assistance;Sit to/from stand;Cueing for compensatory techniques                                  Frequency  Min 2X/week        Progress Toward Goals  OT Goals(current goals can now be found in the care plan section)      progressing         End of Session Equipment Utilized During Treatment: Rolling walker   Activity Tolerance Patient tolerated treatment well   Patient Left In bed with call bell and sister present  Nurse  Communication Mobility status         Betsy Pries 02/28/2016, 1:39 PM

## 2016-02-28 NOTE — Progress Notes (Signed)
LCSWA met with patient and sister at bedside. Patient in pleasant mood. Patient and sister report the plan is for pt. To discharge to [facility] at this time.  LCSWA confirmed private room at SNF.

## 2016-02-28 NOTE — Discharge Instructions (Signed)
Ileostomy Home Guide An ileostomy is an opening for stool to leave your body when a medical condition prevents it from leaving through the usual opening (rectum). During a surgery, a piece of small intestine (ileum) is brought through a hole in the abdominal wall. The new opening is called a stoma or ostomy. A bag or pouch fits over the stoma to catch stool and gas. Your stool may be liquid at first. Over time, it may become about the consistency of applesauce. CARING FOR YOUR STOMA Normally, the stoma looks a lot like the inside of your cheek: pink and moist. At first, it may be swollen, but this swelling will decrease within 6 weeks. Keep the skin around the stoma clean and dry. You can gently wash your stoma in the shower with a clean, soft washcloth. If you develop any skin irritation, your caregiver may give you a stoma powder or ointment to help heal the area. Do not use any products other than those specifically given to you by your caregiver.  Your stoma should not be uncomfortable. If you notice any stinging or burning, your pouch may be leaking, and the skin around your stoma may be coming into contact with stool. This can cause skin irritation. If you notice stinging, you should replace your pouch with a new one and discard the old one. OSTOMY POUCHES The pouch that fits over the ostomy can be made up of either 1 or 2 pieces. A one-piece pouch has a skin barrier piece and the pouch itself in one unit. A two-piece pouch has a skin barrier with a separate pouch that snaps on and off of the skin barrier. Either way, you should empty the pouch when it is only  to  full. Do not let more stool or gas build up. This could cause the pouch to leak. Some ostomy bags have a built-in gas release valve. Ostomy deodorizer (5 drops) can be put into the pouch to prevent odor. Some people use an ostomy lubricant to help the stool slide out of the bag more easily and completely.  EMPTYING YOUR OSTOMY POUCH You  may get lessons on how to empty your pouch from a wound-ostomy nurse before you leave the hospital. Here are the basic steps:  Wash your hands with soap and water.  Sit far back on the toilet.  Put pieces of toilet paper into the toilet water. This will prevent splashing as you empty the stool into the toilet bowl.  Unclip or unvelcro the tail end of the pouch.  Unroll the tail and empty stool into the toilet.  Clean the tail with toilet paper.  Reroll the tail, and clip or velcro it closed.  Wash your hands again. CHANGING YOUR OSTOMY POUCH Change your ostomy pouch about every 3 to 4 days for the first 6 weeks, then every 5 to 7 days. Always change the bag sooner if you begin to notice any discomfort or irritation of the skin around the stoma. When possible, plan to change your ostomy pouching system before eating and drinking because this will lessen the chance of stool coming out during the change. A wound-ostomy nurse may teach you how to change your pouch before you leave the hospital. Here are the basic steps:  Lay out your supplies.  Wash your hands with soap and water.  Carefully remove the old pouch.  Wash the stoma and the skin around the stoma. Allow the area to dry. Men may be advised to shave any  hair around the stoma very carefully. This will make the adhesive stick better.  Use the stoma measuring guide that comes with your pouch set to decide what size hole you will need to cut in the skin barrier piece. Choose the smallest possible size that will hold the stoma but will not touch it.  Use the guide to trace the circle on the back of the skin barrier piece. Cut out the hole.  Hold the skin barrier piece over the stoma to make sure the hole is the correct size.  Remove the adhesive paper backing from the skin barrier piece.  Squeeze stoma paste around the opening of the skin barrier piece.  Clean and dry the skin around the stoma again.  Carefully fit the skin  barrier piece over your stoma.  If you are using a two-piece pouch, snap the pouch onto the skin barrier piece.  Close the tail of the pouch.  Put your hand over the top of the skin barrier piece to help warm it for about 5 minutes, so that it conforms to your body better.  Wash your hands again. DIET TIPS Because you have a higher risk of a blockage in the first 2 months after getting an ileostomy, you should decrease your fiber intake for that time period. Fibrous foods include:  Celery.  Cabbage (and coleslaw).  Pineapple.  Mushrooms.  Corn and popcorn.  Whole fruits and vegetables, especially with the skins on.  Foods that contain seeds.  Oranges, grapefruit, tangerines, and other citrus fruit.  Nuts. After about 2 months, you can slowly add these kinds of foods back into your regular diet, as tolerated. Other tips:  Drink about eight, 8 oz glasses of water each day.  You can prevent gas by eating slowly and chewing your food thoroughly.  If you feel concerned that you have too much gas, you can cut back on gas-producing foods, such as:  Spicy foods.  Onions and garlic.  Cruciferous vegetables (cabbage, broccoli, cauliflower, Brussels sprouts).  Beans and legumes.  Some cheeses.  Eggs.  Fish.  Bubbly (carbonated) drinks.  Chewing gum. GENERAL TIPS  You can shower with or without the bag in place.  Always keep the bag snapped on if you are bathing or swimming.  If your bag gets wet, you can dry it with a blow-dryer set to cool.  Avoid wearing tight clothing directly over your stoma so that it does not become irritated or bleed. Tight clothing can also prevent the stool from draining into the pouch, which can cause it to leak.  It is helpful to always have an extra skin barrier and pouch with you when traveling. Do not leave them anywhere too warm, because parts of them can melt.  Do not let your seat belt rest on your stoma. Try to keep the seat  belt either above or below your stoma, or use a tiny pillow to cushion it.  You can still participate in sports, but you should avoid activities in which there is a risk of getting hit in the abdomen.  You can still have sex. It is a good idea to empty your pouch prior to sex. Some people and their partners feel very comfortable seeing the pouch during sex. Others choose to wear lingerie or a T-shirt that covers the device. SEEK IMMEDIATE MEDICAL CARE IF:  You feel dizzy, light-headed, or faint.  You measure pouch drainage of more than 1,500 mL per day. This amount of drainage can lead  to dehydration.  You notice a change in the size or color of the stoma, especially if it becomes very red, purple, black, or pale white.  You have bloody stools or bleeding from the ostomy.  You have abdominal pain, nausea, vomiting, or bloating.  There is anything unusual protruding from the ostomy.  You have irritation or red skin around the ostomy.  No stool is passing from the stoma.  You have diarrhea (requiring pouch emptying more frequently than normal).   This information is not intended to replace advice given to you by your health care provider. Make sure you discuss any questions you have with your health care provider.   Document Released: 04/17/2003 Document Revised: 05/05/2014 Document Reviewed: 09/11/2010 Elsevier Interactive Patient Education 2016 [employer].    How to Take a [employer] A sitz bath is a warm water bath that is taken while you are sitting down. The water should only come up to your hips and should cover your buttocks. Your health care provider may recommend a sitz bath to help you:   Clean the lower part of your body, including your genital area.  With itching.  With pain.  With sore muscles or muscles that tighten or spasm. HOW TO TAKE A SITZ BATH Take 3-4 sitz baths per day or as told by your health care provider. 1. Partially fill a bathtub with warm  water. You will only need the water to be deep enough to cover your hips and buttocks when you are sitting in it. 2. If your health care provider told you to put medicine in the water, follow the directions exactly. 3. Sit in the water and open the tub drain a little. 4. Turn on the warm water again to keep the tub at the correct level. Keep the water running constantly. 5. Soak in the water for 15-20 minutes or as told by your health care provider. 6. After the sitz bath, pat the affected area dry first. Do not rub it. 7. Be careful when you stand up after the sitz bath because you may feel dizzy. SEEK MEDICAL CARE IF:  Your symptoms get worse. Do not continue with sitz baths if your symptoms get worse.  You have new symptoms. Do not continue with sitz baths until you talk with your health care provider.   This information is not intended to replace advice given to you by your health care provider. Make sure you discuss any questions you have with your health care provider.   Document Released: 01/05/2004 Document Revised: 08/29/2014 Document Reviewed: 04/12/2014 Elsevier Interactive Patient Education [employer].

## 2016-02-28 NOTE — Progress Notes (Addendum)
Physical Therapy Treatment Patient Details Name: Sara Barnes MRN: [id number] DOB: December 12, 1940 Today's Date: 02/28/2016    History of Present Illness 74 yo female s/p end ileostomy 10/17    PT Comments    Patient was seen in bed upon arrival with family present.  Performed supine to sit and sit to supine requiring S. Sit to stand x min guard with VC's for safe hand placement when rising and sitting from bed. Patient rated pain as a 4/10 in stomach prior to treatment. Gait x 125 ft x min guard with VC's for safe foot placement while maneuvering turns and being aware of lines. Patient has very supportive sister in room. Plan to D/C to [facility] Re[facility] at [facility] tomorrow before returning home.   Follow Up Recommendations  SNF     Equipment Recommendations  Rolling walker with 5" wheels    Recommendations for Other Services       Precautions / Restrictions Precautions Precautions: Fall Precaution Comments: has fallen x 2 in hospital, pt denies prior falls at home; Restrictions Weight Bearing Restrictions: No    Mobility  Bed Mobility Overal bed mobility: Needs Assistance Bed Mobility: Sit to Supine     Supine to sit: Supervision Sit to supine: Min guard   General bed mobility comments: minA to help BLE's back to bed.   Transfers Overall transfer level: Needs assistance Equipment used: Rolling walker (2 wheeled) Transfers: Sit to/from Stand Sit to Stand: Min guard Stand pivot transfers: Min guard       General transfer comment: VC's for safe hand placement when standing and before sitting on bed.   Ambulation/Gait Ambulation/Gait assistance: Min guard Ambulation Distance (Feet): 125 Feet Assistive device: Rolling walker (2 wheeled) Gait Pattern/deviations: Step-through pattern;Trunk flexed;Decreased stride length;Decreased step length - left;Decreased step length - right Gait velocity: decreased Gait velocity interpretation: Below normal speed for age/gender General Gait  Details: VC's required for safe LE placement while maneuvering turns and to be aware of IV lines.    Stairs            Wheelchair Mobility    Modified Rankin (Stroke Patients Only)       Balance                                    Cognition Arousal/Alertness: Awake/alert Behavior During Therapy: WFL for tasks assessed/performed Overall Cognitive Status: Within Functional Limits for tasks assessed                      Exercises      General Comments        Pertinent Vitals/Pain Pain Assessment: 0-10 Pain Score: 4  Pain Location: stomach Pain Descriptors / Indicators: Discomfort;Sore Pain Intervention(s): Repositioned;Monitored during session    Home Living                      Prior Function            PT Goals (current goals can now be found in the care plan section) Progress towards PT goals: Progressing toward goals    Frequency    Min 3X/week      PT Plan Current plan remains appropriate    Co-evaluation             End of Session Equipment Utilized During Treatment: Gait belt Activity Tolerance: Patient tolerated treatment well Patient left: in bed;with call  bell/phone within reach;with family/visitor present     Time:  - 13:36 - 13:45    Charges:     1 gt                   G Codes:      Caitlin Medlin, SPTA [facility] Rehab [phone]  Present and agree with above  Rica Koyanagi  PTA [facility]  Acute  Rehab Pager      [phone]

## 2016-02-28 NOTE — Progress Notes (Signed)
Nutrition Follow-up  DOCUMENTATION CODES:   Severe malnutrition in context of acute illness/injury  INTERVENTION:  Reviewed Ileostomy Nutrition Therapy, a handout from the Academy of [employer] with patient and sister. Reviewed soft diet after ileostomy and foods recommended/not recommended. Answered questions.  Discontinued Pro-Stat.  Provide Boost Breeze po TID, each supplement provides 250 kcal and 9 grams of protein.  NUTRITION DIAGNOSIS:   Malnutrition (Severe) related to acute illness as evidenced by percent weight loss, energy intake < or equal to 50% for > or equal to 5 days.  Ongoing.  GOAL:   Patient will meet greater than or equal to 90% of their needs  Not met.   MONITOR:   PO intake, Supplement acceptance, Weight trends, Labs, I & O's  REASON FOR ASSESSMENT:   Consult New TPN/TNA (TPN prior to diverting ileostomy next week and to improve albumin)  ASSESSMENT:   75 y.o.femalewith medical history significant of ulcerative colitis versus Crohn's disease, believed to be UC eventually,otherwise healthy, presents to the emergency room with chief complaint of severe abdominal pain for the past several days PTA.  -Patient s/p I&D of horseshow abscess on 01/22/2016. -Now POD17 s/p diverting ileostomy on 02/11/2016.  -TPN was discontinued yesterday and patient was advanced to Soft Diet.  -Possible d/c to SNF in the next 1-2 days.  Spoke with patient and sister at bedside. Patient reports her appetite is improving slowly. She is able to tolerate small meals. Endorses ongoing nausea and abdominal pain - medication is helping. Reports ileostomy output is becoming more formed as she is able to eat food. Per chart she has only had 300 ml ostomy output in the last 24 hours, which is decreased from the 1.5-2.1 L she was having a few days ago. She has not been drinking the Pro-Stat but is ready to try Boost Breeze again.  Meal Completion: 50-75% of small meals  and drinks per patient In the past 24 hours, patient has had approximately 502 kcal (33% minimum estimated kcal needs) and 27 grams protein (31% minimum estimated protein needs).   Medications reviewed and include: Vitamin B12 1000 micrograms daily, famotidine, Novolog sliding scale TID with meals, Zofran 4 mg Q8hrs, prednisone 10 mg daily, Senna, simethicone 160 mg TID with meals, NS @ 50 ml/hr.  Labs reviewed: CBG 84-111 past 24 hrs. Chem Profile 11/1: Sodium 130, CO2 21.  Weight trend: 130 lbs on 10/30. Patient has lost 30 lbs 19% body weight over 1.5 months since admission.   Discussed with RN.   Diet Order:  DIET SOFT Room service appropriate? Yes; Fluid consistency: Thin  Skin:  Wound (see comment) (MSAD to perineum, buttocks; closed incision rectum)  Last BM:  02/27/2016  Height:   Ht Readings from Last 1 Encounters:  01/07/16 5' 6"  (1.676 m)    Weight:   Wt Readings from Last 1 Encounters:  02/25/16 130 lb (59 kg)    Ideal Body Weight:  59.09 kg  BMI:  Body mass index is 20.98 kg/m.  Estimated Nutritional Needs:   Kcal:  1500-1730 (MSJ x 1.3-1.5)  Protein:  88-95 grams (1.4-1.5 grams/kg)  Fluid:  1.5-1.7 L/day  EDUCATION NEEDS:   Education needs addressed (Ileostomy Nutrition Therapy from the Academy of [employer] Dietetics.)  Willey Blade, MS, RD, LDN Pager: [phone] After Hours Pager: [phone]

## 2016-02-28 NOTE — Evaluation (Deleted)
Physical Therapy Treatment Patient Details Name: Sara Barnes MRN: [id number] DOB: 12-13-1940 Today's Date: 02/28/2016    History of Present Illness 75 yo female s/p end ileostomy 10/17    PT Comments    Patient was seen in bed upon arrival with family present.  Performed supine to sit and sit to supine requiring S. Sit to stand x min guard with VC's for safe hand placement when rising and sitting from bed. Patient rated pain as a 4/10 in stomach prior to treatment. Gait x 125 ft x min guard with VC's for safe foot placement while maneuvering turns and being aware of lines. Patient has very supportive sister in room. Plan to D/C to ST Rehab at [facility] tomorrow before returning home.   Follow Up Recommendations  SNF     Equipment Recommendations  Rolling walker with 5" wheels    Recommendations for Other Services       Precautions / Restrictions Precautions Precautions: Fall Precaution Comments: has fallen x 2 in hospital, pt denies prior falls at home; Restrictions Weight Bearing Restrictions: No    Mobility  Bed Mobility Overal bed mobility: Needs Assistance Bed Mobility: Sit to Supine     Supine to sit: Supervision Sit to supine: Min guard   General bed mobility comments: minA to help BLE's back to bed.   Transfers Overall transfer level: Needs assistance Equipment used: Rolling walker (2 wheeled) Transfers: Sit to/from Stand Sit to Stand: Min guard Stand pivot transfers: Min guard       General transfer comment: VC's for safe hand placement when standing and before sitting on bed.   Ambulation/Gait Ambulation/Gait assistance: Min guard Ambulation Distance (Feet): 125 Feet Assistive device: Rolling walker (2 wheeled) Gait Pattern/deviations: Step-through pattern;Trunk flexed;Decreased stride length;Decreased step length - left;Decreased step length - right Gait velocity: decreased Gait velocity interpretation: Below normal speed for age/gender General Gait  Details: VC's required for safe LE placement while maneuvering turns and to be aware of IV lines.    Stairs            Wheelchair Mobility    Modified Rankin (Stroke Patients Only)       Balance                                    Cognition Arousal/Alertness: Awake/alert Behavior During Therapy: WFL for tasks assessed/performed Overall Cognitive Status: Within Functional Limits for tasks assessed                      Exercises      General Comments        Pertinent Vitals/Pain Pain Assessment: 0-10 Pain Score: 4  Pain Location: stomach Pain Descriptors / Indicators: Discomfort;Sore Pain Intervention(s): Monitored during session;Repositioned    Home Living                      Prior Function            PT Goals (current goals can now be found in the care plan section)      Frequency    Min 3X/week      PT Plan      Co-evaluation             End of Session Equipment Utilized During Treatment: Gait belt Activity Tolerance: Patient tolerated treatment well Patient left: in bed;with call bell/phone within reach;with family/visitor present  Time:  -     Charges:                       G CodesHall Busing, [facility] [phone]

## 2016-02-29 DIAGNOSIS — E440 Moderate protein-calorie malnutrition: Secondary | ICD-10-CM | POA: Diagnosis not present

## 2016-02-29 DIAGNOSIS — F339 Major depressive disorder, recurrent, unspecified: Secondary | ICD-10-CM | POA: Diagnosis not present

## 2016-02-29 DIAGNOSIS — Z48815 Encounter for surgical aftercare following surgery on the digestive system: Secondary | ICD-10-CM | POA: Diagnosis not present

## 2016-02-29 DIAGNOSIS — R279 Unspecified lack of coordination: Secondary | ICD-10-CM | POA: Diagnosis not present

## 2016-02-29 DIAGNOSIS — D62 Acute posthemorrhagic anemia: Secondary | ICD-10-CM | POA: Diagnosis not present

## 2016-02-29 DIAGNOSIS — K219 Gastro-esophageal reflux disease without esophagitis: Secondary | ICD-10-CM | POA: Diagnosis not present

## 2016-02-29 DIAGNOSIS — K5000 Crohn's disease of small intestine without complications: Secondary | ICD-10-CM | POA: Diagnosis not present

## 2016-02-29 DIAGNOSIS — K5900 Constipation, unspecified: Secondary | ICD-10-CM | POA: Diagnosis not present

## 2016-02-29 DIAGNOSIS — R2689 Other abnormalities of gait and mobility: Secondary | ICD-10-CM | POA: Diagnosis not present

## 2016-02-29 DIAGNOSIS — M6281 Muscle weakness (generalized): Secondary | ICD-10-CM | POA: Diagnosis not present

## 2016-02-29 DIAGNOSIS — K611 Rectal abscess: Secondary | ICD-10-CM | POA: Diagnosis not present

## 2016-02-29 DIAGNOSIS — R5381 Other malaise: Secondary | ICD-10-CM | POA: Diagnosis not present

## 2016-02-29 DIAGNOSIS — E871 Hypo-osmolality and hyponatremia: Secondary | ICD-10-CM | POA: Diagnosis not present

## 2016-02-29 LAB — GLUCOSE, CAPILLARY
GLUCOSE-CAPILLARY: 79 mg/dL (ref 65–99)
GLUCOSE-CAPILLARY: 80 mg/dL (ref 65–99)

## 2016-02-29 MED ORDER — LORAZEPAM 0.5 MG PO TABS: 0.5000 mg | ORAL_TABLET | Freq: Three times a day (TID) | ORAL | 0 refills | Status: DC | PRN

## 2016-02-29 MED ORDER — FLUOXETINE HCL 40 MG PO CAPS: 40.0000 mg | ORAL_CAPSULE | Freq: Every day | ORAL | 3 refills | Status: DC

## 2016-02-29 MED ORDER — SENNOSIDES-DOCUSATE SODIUM 8.6-50 MG PO TABS: 2.0000 | ORAL_TABLET | Freq: Two times a day (BID) | ORAL | Status: DC | PRN

## 2016-02-29 MED ORDER — FAMOTIDINE 40 MG PO TABS: 40.0000 mg | ORAL_TABLET | Freq: Two times a day (BID) | ORAL | Status: DC

## 2016-02-29 MED ORDER — ONDANSETRON HCL 4 MG/2ML IJ SOLN: 4.0000 mg | Freq: Three times a day (TID) | INTRAMUSCULAR | 0 refills | Status: DC

## 2016-02-29 MED ORDER — INSULIN ASPART 100 UNIT/ML ~~LOC~~ SOLN: 0.0000 [IU] | Freq: Every day | SUBCUTANEOUS | 11 refills | Status: DC

## 2016-02-29 MED ORDER — BOOST / RESOURCE BREEZE PO LIQD: 1.0000 | Freq: Three times a day (TID) | ORAL | 0 refills | Status: DC

## 2016-02-29 MED ORDER — MORPHINE SULFATE ER 30 MG PO TBCR: 30.0000 mg | EXTENDED_RELEASE_TABLET | Freq: Two times a day (BID) | ORAL | 0 refills | Status: DC

## 2016-02-29 MED ORDER — ONDANSETRON 4 MG PO TBDP: 4.0000 mg | ORAL_TABLET | Freq: Three times a day (TID) | ORAL | 0 refills | Status: DC

## 2016-02-29 MED ORDER — TINIDAZOLE 500 MG PO TABS: 500.0000 mg | ORAL_TABLET | Freq: Two times a day (BID) | ORAL | Status: DC

## 2016-02-29 MED ORDER — ALUM & MAG HYDROXIDE-SIMETH 200-200-20 MG/5ML PO SUSP: 30.0000 mL | ORAL | 0 refills | Status: DC | PRN

## 2016-02-29 MED ORDER — HEPARIN SODIUM (PORCINE) 5000 UNIT/ML IJ SOLN: 5000.0000 [IU] | Freq: Three times a day (TID) | INTRAMUSCULAR | Status: DC

## 2016-02-29 MED ORDER — ACETAMINOPHEN 325 MG PO TABS: 650.0000 mg | ORAL_TABLET | Freq: Four times a day (QID) | ORAL | Status: DC | PRN

## 2016-02-29 MED ORDER — ZOLPIDEM TARTRATE 5 MG PO TABS: 5.0000 mg | ORAL_TABLET | Freq: Every evening | ORAL | 0 refills | Status: DC | PRN

## 2016-02-29 MED ORDER — PREDNISONE 10 MG PO TABS: 10.0000 mg | ORAL_TABLET | Freq: Every day | ORAL | Status: DC

## 2016-02-29 MED ORDER — SIMETHICONE 80 MG PO CHEW: 160.0000 mg | CHEWABLE_TABLET | Freq: Three times a day (TID) | ORAL | 0 refills | Status: DC

## 2016-02-29 MED ORDER — DICYCLOMINE HCL 10 MG PO CAPS: 10.0000 mg | ORAL_CAPSULE | Freq: Three times a day (TID) | ORAL | Status: DC

## 2016-02-29 MED ORDER — FLUTICASONE PROPIONATE 50 MCG/ACT NA SUSP: 2.0000 | Freq: Every day | NASAL | 2 refills | Status: DC | PRN

## 2016-02-29 MED ORDER — INSULIN ASPART 100 UNIT/ML ~~LOC~~ SOLN: 0.0000 [IU] | Freq: Three times a day (TID) | SUBCUTANEOUS | 11 refills | Status: DC

## 2016-02-29 NOTE — Progress Notes (Signed)
Report called to [facility] at [facility].

## 2016-02-29 NOTE — Clinical Social Work Placement (Signed)
Medical Social Worker facilitated patient discharge including contacting patient family and facility to confirm patient discharge plans.  Clinical information faxed to facility and family agreeable with plan.  MSW arranged ambulance transport via [place] to [facility] and [place].  RN to call report prior to discharge.  Medical Social Worker will sign off for now as social work intervention is no longer needed. Please consult [place] again if new need arises.  CLINICAL SOCIAL WORK PLACEMENT  NOTE  Date:  02/29/2016  Patient Details  Name: Sara Barnes MRN: [id number] Date of Birth: October 17, 1940  Clinical Social Work is seeking post-discharge placement for this patient at the [place] level of care (*CSW will initial, date and re-position this form in  chart as items are completed):  Yes   Patient/family provided with [place] Work Department's list of facilities offering this level of care within the geographic area requested by the patient (or if unable, by the patient's family).  Yes   Patient/family informed of their freedom to choose among providers that offer the needed level of care, that participate in Medicare, Medicaid or managed care program needed by the patient, have an available bed and are willing to accept the patient.  Yes   Patient/family informed of [facility]'s ownership interest in [facility] and [facility], as well as of the fact that they are under no obligation to receive care at these facilities.  PASRR submitted to EDS on 02/15/16     PASRR number received on 02/15/16     Existing PASRR number confirmed on       FL2 transmitted to all facilities in geographic area requested by pt/family on 02/15/16     FL2 transmitted to all facilities within larger geographic area on       Patient informed that his/her managed care company has contracts with or will negotiate with certain facilities, including the following:         Yes   Patient/family informed of bed offers received.  Patient chooses bed at  [facility] and [facility] )     Physician recommends and patient chooses bed at      Patient to be transferred to  [facility] and [place] ) on 02/29/16.  Patient to be transferred to facility by  [facility] )     Patient family notified on 02/29/16 of transfer.  Name of family member notified:   (Pt's son, Shea Evans)     PHYSICIAN Please prepare priority discharge summary, including medications     Additional Comment:    _______________________________________________ Glendon Axe A 02/29/2016, 10:08 AM

## 2016-02-29 NOTE — Progress Notes (Signed)
Agree with assessment in first part of shift. No changes noted. Will continue to monitor.  

## 2016-02-29 NOTE — Clinical Social Work Note (Signed)
Patient's sister, Vaughan Basta to transport patient to [facility] once paperwork is completed. Ppwk scheduled for 12pm.   Medical Social Worker will sign off for now as social work intervention is no longer needed. Please consult [place] again if new need arises.  Glendon Axe, MSW [phone] 02/29/2016 11:18 AM

## 2016-02-29 NOTE — Consult Note (Signed)
[facility] Nurse ostomy follow up: Discharging to rehab facility today Stoma type/location: RUQ ileostomy Stomal assessment/size: 1 and 3/8 inches Peristomal assessment: intact, clear Treatment options for stomal/peristomal skin: skin barrier ring Output brown effluent Ostomy pouching: 1pc.convex pouch with skin barrier ring  Education provided: Florentina Jenny performed ostomy pouch change today independently with minimal cueing. Discharge supplies ready for transport to facility and additional supplies (2 week's worth) have arrived at the patient's home. Patient and sister have my phone number for contact if needed for questions, problems.  Enrolled patient in [place] Start Discharge program: Yes [facility] nursing team will remain available to this patient, the nursing, surgical and medical teams.   Thanks, Maudie Flakes, MSN, RN, [place], Arther Abbott  Pager# [phone]

## 2016-02-29 NOTE — Discharge Summary (Signed)
[facility] Discharge Summary   Patient ID: Sara Barnes MRN: [id number] DOB/AGE: 06-03-1940 75 y.o.  Admit date: 01/07/2016 Discharge date: 02/29/2016  Admitting Diagnosis: LUQ pain Indeterminate colitis  Discharge Diagnosis Patient Active Problem List   Diagnosis Date Noted  . Small bowel obstruction   . Ileus ([place])   . Generalized anxiety disorder 02/14/2016  . 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 Hypokalemia   . Fever   . Other depression due to general medical condition   . Difficulty in walking, not elsewhere classified   . Acute blood loss anemia   . Absolute anemia   . Left upper quadrant pain   . Abdominal pain 01/07/2016  . GI bleed 01/07/2016  . IBS (irritable bowel syndrome)   . Tenesmus   . 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 Diarrhea 12/24/2015  . Crohn disease ([place]) 12/24/2015  . Ulcerative pancolitis with abscess ([place])   . Rectal pain   . Leucocytosis   . 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 Perirectal abscess 09/29/2013  . Depression 08/12/2006    Consultants Sara Flakes RN, WOC Sara Gemma MD, general surgery Sara Cellar MD, GI  Imaging: CT abdomen pelvis w contrast 01/21/16: 1. Growing perianal abscess and fistula. Inflammatory changes extend throughout bilateral ischioanal fat, the left lower gluteal fold, and into the left labia majora. There are multiple foci of air concerning for gas-forming organism. 2. Further improvement in inflammatory changes of the colon with mild residual in the descending colon. These results will be called to the ordering clinician or representative by the Radiologist Assistant, and communication documented in the PACS or Vision Dashboard.  CT pelvis w contrast 01/30/16: Status post debridement of a perirectal abscess. There is been significant improvement with no significant residual fluid collection identified. Surgical drain is noted in  place.  DG abd 2 views 02/15/16: 1. Diffuse gaseous small bowel dilatation compatible with adynamic or inflammatory ileus versus evolving distal small bowel obstruction. 2. No intraperitoneal free air.  DG abd 2 views 02/23/16: Improving small bowel obstruction pattern.  Procedures Dr. Harlow Barnes (01/22/16) - Incision, drainage and packing of complex perirectal abscess Dr. Hassell Barnes (02/11/16) - Laparoscopic end-ileostomy  Hospital Course:  Sara Barnes is a 75yofemalewith PMH Crohn's colitis who was admitted to [facility] on 01/07/16 for severe abdominal pain. Nine days prior to this she was hospitalized and discharged home for the same complaint; she was placed on steroids and improved some but never really achieved full remission. She has crampy abdominal pain, nausea, poor appetite, vomiting, and bloody bowel movements. Gastroenterology evaluated the patient with flexible sigmoidoscopy, CMV was negative and she was started on Remicade. Initially did better and was tolerating some diet, but on 9/21 her pain and bloody clots got worse. CT abdomen/pelvis showed a perirectal abscess. Patient underwent I&D and penrose drain placement on 9/26. Steroids were slowly tapered per GI recommendations. Sara Barnes slowly improved but eventually was found to have persistent stool leaking around penrose drain. A diverting ostomy was recommended but patient was hesitant to undergo any procedure. On 10/16 patient and providers agreed to proceed with a diverting end ileostomy. Tolerated procedure well and she was continued on cipro and tinidazole postoperatively. Started on a clear liquid diet on 10/17 but she unfortunately developed an ileus. She struggled intermittently with nausea and vomiting. Due to inability to tolerate any significant amount of nutrition by mouth, she was started on TPN on 10/28. Once nausea  was better controlled her diet was advanced and TPN stopped on 11/1. Sara Barnes worked with therapies during her  admission and it was recommended that she be discharged to a SNF for further rehabilitation prior to going home. Per GI she has been working to wean off prednisone in the following regimen: 15 mg for about 5 days, then 10 mg for 5 days, then 5 mg for 5 days, then stop; today is day 2 of 44m. She will also continue on tinidazole 5023mBID. On 02/29/16 the patient was voiding well, tolerating diet and adequate amount of fluids, ambulating well, pain well controlled, vital signs stable, incisions c/d/i and felt stable for discharge to SNF.  Patient will follow up in our office in about 2 weeks and knows to call with questions or concerns.    Physical Exam: Gen: Alert, NAD Card: RRR Pulm: CTAB, effort normal Abd: Soft, ND, NT, +BS, incisions C/D/I, RLQ ostomy pink with small amountliquid brown stool and air in bag Perineum: penrose drain in place with no purulent drainage, mild erythema     Medication List    STOP taking these medications   belladonna-PHENObarbital 16.2 MG/5ML Elix Commonly known as:  DONNATAL   cyanocobalamin 1000 MCG/ML injection Commonly known as:  (VITAMIN B-12)   HYDROcodone-acetaminophen 5-325 MG tablet Commonly known as:  NORCO/VICODIN   hydrocortisone 25 MG suppository Commonly known as:  ANUSOL-HC   mesalamine 1000 MG suppository Commonly known as:  CANASA   ondansetron 4 MG tablet Commonly known as:  ZOFRAN Replaced by:  ondansetron 4 MG/2ML Soln injection   Potassium Chloride ER 20 MEQ Tbcr     TAKE these medications   acetaminophen 325 MG tablet Commonly known as:  TYLENOL Take 2 tablets (650 mg total) by mouth every 6 (six) hours as needed for moderate pain or fever (headache). What changed:  medication strength  how much to take  reasons to take this  Another medication with the same name was removed. Continue taking this medication, and follow the directions you see here.   alum & mag hydroxide-simeth 200-200-20 MG/5ML  suspension Commonly known as:  MAALOX/MYLANTA Take 30 mLs by mouth as needed for indigestion, heartburn or flatulence.   dicyclomine 10 MG capsule Commonly known as:  BENTYL Take 1 capsule (10 mg total) by mouth 4 (four) times daily -  before meals and at bedtime.   famotidine 40 MG tablet Commonly known as:  PEPCID Take 1 tablet (40 mg total) by mouth 2 (two) times daily.   feeding supplement Liqd Take 1 Container by mouth 3 (three) times daily between meals.   FLUoxetine 40 MG capsule Commonly known as:  PROZAC Take 1 capsule (40 mg total) by mouth daily. What changed:  medication strength  how much to take   fluticasone 50 MCG/ACT nasal spray Commonly known as:  FLONASE Place 2 sprays into both nostrils daily as needed for allergies or rhinitis.   heparin 5000 UNIT/ML injection Inject 1 mL (5,000 Units total) into the skin every 8 (eight) hours.   insulin aspart 100 UNIT/ML injection Commonly known as:  novoLOG Inject 0-5 Units into the skin at bedtime.   insulin aspart 100 UNIT/ML injection Commonly known as:  novoLOG Inject 0-9 Units into the skin 3 (three) times daily with meals.   LORazepam 0.5 MG tablet Commonly known as:  ATIVAN Take 1 tablet (0.5 mg total) by mouth every 8 (eight) hours as needed for anxiety.   morphine 30 MG 12 hr tablet  Commonly known as:  MS CONTIN Take 1 tablet (30 mg total) by mouth every 12 (twelve) hours.   ondansetron 4 MG disintegrating tablet Commonly known as:  ZOFRAN-ODT Take 1 tablet (4 mg total) by mouth every 8 (eight) hours.   ondansetron 4 MG/2ML Soln injection Commonly known as:  ZOFRAN Inject 2 mLs (4 mg total) into the vein every 8 (eight) hours. Replaces:  ondansetron 4 MG tablet   predniSONE 10 MG tablet Commonly known as:  DELTASONE Take 1 tablet (10 mg total) by mouth daily with breakfast. Take 53m daily with breakfast for 3 more days, then 520mdaily, then off. Start taking on:  03/01/2016 What  changed:  medication strength  how much to take  additional instructions   senna-docusate 8.6-50 MG tablet Commonly known as:  Senokot-S Take 2 tablets by mouth 2 (two) times daily as needed for mild constipation.   simethicone 80 MG chewable tablet Commonly known as:  MYLICON Chew 2 tablets (160 mg total) by mouth 3 (three) times daily with meals.   tinidazole 500 MG tablet Commonly known as:  TINDAMAX Take 1 tablet (500 mg total) by mouth 2 (two) times daily.   zolpidem 5 MG tablet Commonly known as:  AMBIEN Take 1 tablet (5 mg total) by mouth at bedtime as needed for sleep. What changed:  reasons to take this         Contact information for follow-up providers    MaBlima DessertGo on 02/28/2016.   Why:  Your appointment is 03/13/2016 at 3:30pm. Please arrive 20 minutes prior to your appointment time to fill out necessary paperwork. Contact information: [place] [address]Phone number: [phone]         Contact information for after-discharge care    Destination    [facility] .   Specialty:  [place]: [phone]                Signed: BRJerrye BeaversPAValley Health [facility] 02/29/2016, 8:49 AM Pager: [phone]: [phone]-Fri 7:00 am-4:30 pm Sat-Sun 7:00 am-11:30 am

## 2016-03-03 ENCOUNTER — Non-Acute Institutional Stay (SKILLED_NURSING_FACILITY): Payer: Commercial Managed Care - HMO | Admitting: Internal Medicine

## 2016-03-03 ENCOUNTER — Encounter: Payer: Self-pay | Admitting: Internal Medicine

## 2016-03-03 ENCOUNTER — Telehealth: Payer: Self-pay | Admitting: Internal Medicine

## 2016-03-03 DIAGNOSIS — G4709 Other insomnia: Secondary | ICD-10-CM

## 2016-03-03 DIAGNOSIS — E871 Hypo-osmolality and hyponatremia: Secondary | ICD-10-CM

## 2016-03-03 DIAGNOSIS — K5000 Crohn's disease of small intestine without complications: Secondary | ICD-10-CM | POA: Diagnosis not present

## 2016-03-03 DIAGNOSIS — K219 Gastro-esophageal reflux disease without esophagitis: Secondary | ICD-10-CM | POA: Diagnosis not present

## 2016-03-03 DIAGNOSIS — F339 Major depressive disorder, recurrent, unspecified: Secondary | ICD-10-CM | POA: Diagnosis not present

## 2016-03-03 DIAGNOSIS — K5900 Constipation, unspecified: Secondary | ICD-10-CM | POA: Diagnosis not present

## 2016-03-03 DIAGNOSIS — K611 Rectal abscess: Secondary | ICD-10-CM

## 2016-03-03 DIAGNOSIS — R5381 Other malaise: Secondary | ICD-10-CM

## 2016-03-03 DIAGNOSIS — E440 Moderate protein-calorie malnutrition: Secondary | ICD-10-CM | POA: Diagnosis not present

## 2016-03-03 DIAGNOSIS — D62 Acute posthemorrhagic anemia: Secondary | ICD-10-CM

## 2016-03-03 NOTE — Telephone Encounter (Signed)
Left message on machine to call back  

## 2016-03-03 NOTE — Telephone Encounter (Signed)
Pt's sister has been given the appt for 03/12/16.  They will call with any concerns

## 2016-03-03 NOTE — Progress Notes (Signed)
LOCATION: [facility]  PCP: Ann Held, DO   Code Status: Full Code  Goals of care: Advanced Directive information Advanced Directives 01/08/2016  Does patient have an advance directive? Yes  Type of Advance Directive Living will  Does patient want to make changes to advanced directive? -  Copy of advanced directive(s) in chart? No - copy requested  Would patient like information on creating an advanced directive? -  Pre-existing out of facility DNR order (yellow form or pink MOST form) -       Extended Emergency Contact Information Primary Emergency Contact: Parrish,[name] Address: [address]          [place], [place] [address] [place] Phone: [phone] Mobile Phone: [phone] Relation: Sister Secondary Emergency Contact: Norlene Campbell, [place] [address] [address] [place] Phone: [phone] Mobile Phone: [phone] Relation: Son   Allergies  Allergen Reactions  . Codeine Nausea And Vomiting  . Mesalamine Nausea And Vomiting  . Other Itching    PERFUMED LOTIONS  Cant have MRI due to ear implant   . Oxycodone-Acetaminophen Rash    Itching & rash Tolerates Dilaudid  . Sulfa Antibiotics Rash    Chief Complaint  Patient presents with  . New Admit To SNF    New Admission Visit     HPI:  Patient is a 75 y.o. female seen today for short term rehabilitation post hospital admission from 01/07/16-02/29/16 with abdominal pain with perirectal abscess and fistula. She underwent incision and drainage with placement of penrose drain on 01/22/16. She then underwent diverting ileostomy on 02/11/16 and was started on antibiotics. She developed postop ileus and was placed on TPN. She was also placed on steroid and received remicaide in hospital for her Chron's colitis. She was discharged from hospital on tinidazole and pepcid. She was followed by general surgery and GI team in the hospital. She is seen in her  room today with her sister at bedside. She is not happy with care at the facility and would like to be discharged home today. She received 1 dose of her tinidazole at the facility and did not receive any further doses due to pharmacy delay. She has worked for 1-2 days with therapy. She can self transfer to the bathroom at present. Denies other complaint this visit.   Review of Systems:  Constitutional: Negative for fever, chills, diaphoresis.  HENT: Negative for headache, congestion, nasal discharge, sore throat, difficulty swallowing.  positive for hearing loss.  Eyes: Negative for blurred vision, double vision and discharge.  Respiratory: Negative for cough, shortness of breath and wheezing.   Cardiovascular: Negative for chest pain, palpitations, leg swelling.  Gastrointestinal: Negative for heartburn, nausea, vomiting. Appetite is fair. Has right sided abdominal wall soreness.  Genitourinary: Negative for dysuria and flank pain.  Musculoskeletal: Negative for back pain, fall.  Skin: Negative for itching, rash.  Neurological: Negative for dizziness. Psychiatric/Behavioral: Negative for depression.   Past Medical History:  Diagnosis Date  . Basal cell carcinoma   . 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 Cancer of lower-inner quadrant of left female breast ([facility]) 11/30/2014  . Complication of anesthesia    anxiety post-op after ear surgeries  . 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 Dental crowns present    recent root canal 11/2014  . Family history of adverse reaction to anesthesia  pt's sister has hx. of post-op N/V  . Indigestion   . PONV (postoperative nausea and vomiting)   . Runny nose 12/07/2014   clear drainage, per pt.  . Seasonal allergies   . Ulcerative colitis   . Vitamin B12 deficiency    Past Surgical History:  Procedure Laterality Date  . ABDOMINAL HYSTERECTOMY     partial  . APPENDECTOMY    . BASAL CELL  CARCINOMA EXCISION Left    nose  . BREAST ENHANCEMENT SURGERY    . BREAST LUMPECTOMY Left    age 24's  . BREAST LUMPECTOMY WITH RADIOACTIVE SEED AND SENTINEL LYMPH NODE BIOPSY Left 12/13/2014   Procedure: LEFT BREAST LUMPECTOMY WITH RADIOACTIVE SEED AND SENTINEL LYMPH NODE BIOPSY;  Surgeon: [name], MD;  Location: [facility];  Service: General;  Laterality: Left;  . COLONOSCOPY WITH PROPOFOL  05/28/2011; 06/28/2014  . FLEXIBLE SIGMOIDOSCOPY N/A 01/08/2016   Procedure: FLEXIBLE SIGMOIDOSCOPY;  Surgeon: [name], MD;  Location: [facility];  Service: Gastroenterology;  Laterality: N/A;  needs MAC due to pain and anxiety  . INCISION AND DRAINAGE PERIRECTAL ABSCESS N/A 09/28/2013   Procedure: IRRIGATION AND DEBRIDEMENT PERIANAL ABSCESS, proctoscopy;  Surgeon: Shann Medal, MD;  Location: [facility];  Service: General;  Laterality: N/A;  . INCISION AND DRAINAGE PERIRECTAL ABSCESS N/A 01/22/2016   Procedure: IRRIGATION AND DEBRIDEMENT PERIRECTAL ABSCESS;  Surgeon: Armandina Gemma, MD;  Location: [facility];  Service: General;  Laterality: N/A;  . INNER EAR SURGERY Bilateral   . LAPAROSCOPIC DIVERTED COLOSTOMY N/A 02/11/2016   Procedure: LAPAROSCOPIC DIVERTED ILEOSTOMY;  Surgeon: Johnathan Hausen, MD;  Location: [facility];  Service: General;  Laterality: N/A;   Social History:   reports that she has never smoked. She has never used smokeless tobacco. She reports that she drinks alcohol. She reports that she does not use drugs.  Family History  Problem Relation Age of Onset  . Prostate cancer Brother 71  . Kidney disease Brother   . Diabetes Brother   . Brain cancer Father   . Rheum arthritis Mother   . Anesthesia problems Sister     post-op N/V  . Emphysema Maternal Grandfather   . Asthma Sister   . Rheum arthritis Brother   . Colon cancer Neg Hx   . Esophageal cancer Neg Hx   . Breast cancer Neg Hx     Medications:   Medication List       Accurate as of 03/03/16  4:20  PM. Always use your most recent med list.          acetaminophen 325 MG tablet Commonly known as:  TYLENOL Take 2 tablets (650 mg total) by mouth every 6 (six) hours as needed for moderate pain or fever (headache).   dicyclomine 10 MG capsule Commonly known as:  BENTYL Take 1 capsule (10 mg total) by mouth 4 (four) times daily -  before meals and at bedtime.   famotidine 40 MG tablet Commonly known as:  PEPCID Take 1 tablet (40 mg total) by mouth 2 (two) times daily.   FLUoxetine 40 MG capsule Commonly known as:  PROZAC Take 1 capsule (40 mg total) by mouth daily.   fluticasone 50 MCG/ACT nasal spray Commonly known as:  FLONASE Place 2 sprays into both nostrils daily as needed for allergies or rhinitis.   heparin 5000 UNIT/ML injection Inject 1 mL (5,000 Units total) into the skin every 8 (eight) hours.   LORazepam 0.5 MG tablet Commonly known as:  ATIVAN Take 1 tablet (0.5 mg total) by mouth every 8 (eight) hours as needed for anxiety.   magnesium hydroxide 400 MG/5ML suspension Commonly known as:  MILK OF MAGNESIA Take 30 mLs by mouth every 6 (six) hours as needed for mild constipation.   morphine 30 MG 12 hr tablet Commonly known as:  MS CONTIN Take 1 tablet (30 mg total) by mouth every 12 (twelve) hours.   ondansetron 4 MG disintegrating tablet Commonly known as:  ZOFRAN-ODT Take 1 tablet (4 mg total) by mouth every 8 (eight) hours.   predniSONE 10 MG tablet Commonly known as:  DELTASONE Take 1 tablet (10 mg total) by mouth daily with breakfast. Take 55m daily with breakfast for 3 more days, then 543mdaily, then off.   senna-docusate 8.6-50 MG tablet Commonly known as:  Senokot-S Take 2 tablets by mouth 2 (two) times daily as needed for mild constipation.   simethicone 80 MG chewable tablet Commonly known as:  MYLICON Chew 2 tablets (160 mg total) by mouth 3 (three) times daily with meals.   tinidazole 500 MG tablet Commonly known as:  TINDAMAX Take 1  tablet (500 mg total) by mouth 2 (two) times daily.   UNABLE TO FIND Med Name: Med pass 60 mL 3 times daily   zolpidem 5 MG tablet Commonly known as:  AMBIEN Take 1 tablet (5 mg total) by mouth at bedtime as needed for sleep.       Immunizations: Immunization History  Administered Date(s) Administered  . Hepatitis B, ped/adol 09/20/2015, 10/22/2015  . Influenza, High Dose Seasonal PF 01/30/2015  . Influenza,inj,Quad PF,36+ Mos 01/26/2014  . PPD Test 02/29/2016  . Pneumococcal Conjugate-13 01/26/2014  . Pneumococcal Polysaccharide-23 12/10/2007  . Td 06/04/2004, 08/12/2006  . Zoster 03/01/2012     Physical Exam:  Vitals:   03/03/16 1415  BP: (!) 117/59  Pulse: 74  Resp: 18  Temp: 97.7 F (36.5 C)  TempSrc: Oral  SpO2: 95%  Weight: 130 lb (59 kg)  Height: 5' 6"  (1.676 m)   Body mass index is 20.98 kg/m.  General- elderly female, thin built, in no acute distress Head- normocephalic, atraumatic Nose- no maxillary or frontal sinus tenderness, no nasal discharge Throat- moist mucus membrane Eyes- PERRLA, EOMI, no pallor, no icterus, no discharge, normal conjunctiva, normal sclera Neck- no cervical lymphadenopathy Cardiovascular- normal s1,s2, no murmur, trace leg edema Respiratory- bilateral clear to auscultation, no wheeze, no rhonchi, no crackles, no use of accessory muscles Abdomen- bowel sounds present, soft, mild tenderness on exam, ileostomy bag with stool in place, ostomy site clean, no guarding or rigidity Musculoskeletal- able to move all 4 extremities, generalized weakness Neurological- alert and oriented to person, place and time Skin- warm and dry Psychiatry- normal mood and affect    Labs reviewed: Basic Metabolic Panel:  Recent Labs  02/22/16 0500 02/24/16 0500 02/25/16 0530 02/27/16 0955  NA 136 132* 128* 130*  K 3.5 3.7 4.2 4.1  CL 101 101 101 104  CO2 28 25 22  21*  GLUCOSE 82 99 105* 101*  BUN <5* 7 11 13   CREATININE 0.52 0.62 0.66  0.58  CALCIUM 8.5* 8.3* 7.6* 7.9*  MG 1.8 2.3 2.1  --   PHOS 4.3 4.1 2.8  --    Liver Function Tests:  Recent Labs  02/12/16 0517 02/22/16 0500 02/25/16 0530  AST 13* 12* 11*  ALT 14 10* 11*  ALKPHOS 57 44 59  BILITOT 0.5 0.2* 0.4  PROT 5.2* 4.6* 4.9*  ALBUMIN 2.5* 2.3* 2.3*   No results for input(s): LIPASE, AMYLASE in the last 8760 hours. No results for input(s): AMMONIA in the last 8760 hours. CBC:  Recent Labs  02/17/16 0653  02/22/16 0500 02/25/16 0530 02/26/16 0433  WBC 9.6  < > 7.9 7.7 8.7  NEUTROABS 6.2  --  5.0 5.0  --   HGB 9.4*  < > 9.2* 9.8* 10.4*  HCT 29.1*  < > 29.1* 29.9* 31.8*  MCV 93.6  < > 93.6 91.4 89.1  PLT 248  < > 206 219 244  < > = values in this interval not displayed. Cardiac Enzymes: No results for input(s): CKTOTAL, CKMB, CKMBINDEX, TROPONINI in the last 8760 hours. BNP: Invalid input(s): POCBNP CBG:  Recent Labs  02/28/16 2204 02/29/16 0748 02/29/16 1135  GLUCAP 121* 79 80    Radiological Exams: Dg Abd 1 View  Result Date: 02/19/2016 CLINICAL DATA:  02/18/2016. EXAM: ABDOMEN - 1 VIEW COMPARISON:  None. FINDINGS: The bowel gas pattern is normal. No radio-opaque calculi or other significant radiographic abnormality are seen. Persistent dilated loops of small bowel again noted consistent with small bowel obstruction. Ostomy is noted in right lower quadrant. No free air. No acute bony abnormality . IMPRESSION: Persistent unchanged dilated loops of small bowel consistent small bowel obstruction. Ostomy noted in the right lower quadrant. Electronically Signed   By: Marcello Moores  Register   On: 02/19/2016 07:14   Dg Abd 2 Views  Result Date: 02/23/2016 CLINICAL DATA:  Abdominal pain today EXAM: ABDOMEN - 2 VIEW COMPARISON:  02/20/2016 FINDINGS: There is no free intraperitoneal gas. Dilated small bowel loops with air-fluid levels have improved. There is no significant large bowel gas. Right lower quadrant ostomy site is noted. IMPRESSION:  Improving small bowel obstruction pattern. Electronically Signed   By: Marybelle Killings M.D.   On: 02/23/2016 14:38   Dg Abd 2 Views  Result Date: 02/20/2016 CLINICAL DATA:  Ileus EXAM: ABDOMEN - 2 VIEW COMPARISON:  Multiple recent previous exams. FINDINGS: Upright film shows no evidence for intraperitoneal free air. Diffuse gaseous small bowel distention persists, but comparing back over multiple studies, while the degree of distension is not substantially changed since yesterday, it is decreased since 02/17/2016. IMPRESSION: No substantial change in diffuse gaseous bowel distention since yesterday, but overall appearance is improved when comparing back to 02/17/2016. Electronically Signed   By: Misty Stanley M.D.   On: 02/20/2016 09:33   Dg Abd 2 Views  Result Date: 02/18/2016 CLINICAL DATA:  Ileus. EXAM: ABDOMEN - 2 VIEW COMPARISON:  02/17/2016. FINDINGS: Soft tissue structures are unremarkable. Multiple dilated loops of small bowel are noted. These findings suggest small bowel obstruction. Ostomy noted in the right lower quadrant. No free air. IMPRESSION: Persistent multiple dilated loops of small bowel suggesting small bowel obstruction. No interim change. Ostomy noted in the right lower quadrant. Electronically Signed   By: Marcello Moores  Register   On: 02/18/2016 09:36   Dg Abd 2 Views  Result Date: 02/17/2016 CLINICAL DATA:  Crohn's disease, status post diverting ileostomy 4 days ago, ileus EXAM: ABDOMEN - 2 VIEW COMPARISON:  02/16/2016 FINDINGS: Right lower quadrant ostomy noted. Similar diffuse gas distention of the small bowel throughout the abdomen and pelvis. Small bowel diameter 3.2 cm, previously 3.7 cm. Diffuse air-fluid levels noted on the upright exam. Appearance compatible with distal small bowel obstruction versus severe ileus. No free air evident. Lung bases clear. Stable cardiomegaly. Degenerative changes of the spine. No abnormal calcifications. IMPRESSION: Similar pattern  of diffuse small  bowel gaseous distension compatible with distal small bowel obstruction versus severe ileus. No free air evident by plain radiography Electronically Signed   By: Jerilynn Mages.  Shick M.D.   On: 02/17/2016 10:12   Dg Abd 2 Views  Result Date: 02/16/2016 CLINICAL DATA:  75 year old female with ulcerative colitis/Crohn's disease. Nausea. Subsequent encounter. EXAM: ABDOMEN - 2 VIEW COMPARISON:  02/15/2016 FINDINGS: Colostomy right lower quadrant. Gas distended small bowel and stomach. Small bowel loops measure up to 3.7 cm without significant change. Slight fold thickening. Differential air-fluid levels. Findings suggestive of small bowel obstruction. No obvious free intraperitoneal air however, diaphragm not included on present exam and therefore small amount of free air cannot be excluded on the current exam. IMPRESSION: Small bowel obstructive pattern similar to the prior exam. No obvious free intraperitoneal air however, diaphragm not included on present exam and therefore small amount of free air cannot be excluded on the current exam. Electronically Signed   By: Genia Del M.D.   On: 02/16/2016 08:53   Dg Abd 2 Views  Result Date: 02/15/2016 CLINICAL DATA:  Nausea and vomiting last night after laparoscopic end ileostomy on 02/11/2016 for Crohn's colitis EXAM: ABDOMEN - 2 VIEW COMPARISON:  CT scan 01/21/2016. FINDINGS: Upright film shows no evidence for intraperitoneal free air. Gaseous dilation of numerous small bowel loops noted with stair stepping air-fluid levels. Small bowel loop in the upper left abdomen is dilated up to 3.9 cm diameter. Supine film shows diffuse gaseous small bowel dilatation with minimal amount of colonic gas evident. Stoma noted right lower quadrant of the abdomen. IMPRESSION: 1. Diffuse gaseous small bowel dilatation compatible with adynamic or inflammatory ileus versus evolving distal small bowel obstruction. 2. No intraperitoneal free air. Electronically Signed   By: Misty Stanley  M.D.   On: 02/15/2016 12:32    Assessment/Plan  Physical deconditioning From generalized weakness. Will have patient work with PT/OT as tolerated to regain strength and restore function.  She wants to go home and do therapy at home. Currently on sq heparin for   Perirectal abscess with fistula S/p incision and drainage and diverting ileostomy. Continue skin care. will need RN service at home. Continue tinidazole 500 mg bid until seen by GI Dr Hilarie Fredrickson. Will need general surgery follow up. Continue morphine 30 mg q12h for pain. Continue simethicone tid with meals.  Chron's colitis Continue tinidazole for now until seen in GI office. Continue tapering course of prednisone 5 mg daily x 5 days. Has completed 10 mg daily course. Dr Vena Rua office to make appointment and notify patient per telephone conversation. Continue tylenol 650 mg q6h prn with morphine 30 mg po bid for pain. Continue zofran for nausea and vomiting. Continue dicyclomine. She will be discharged home with her medications and has 24 pills of morphine.   gerd Continue pepcid 40 mg bid and monitor.  Blood loss anemia Post op, monitor cbc with PCP  Protein calorie malnutrition Encouraged po intake. Follow up on weight and po intake at PCP office  Hyponatremia Will need lab to follow up at PCP  Constipation Recent post op ileus, currently has stool in ileostomy bag. Continue senokot s 2 tab bid as needed  Insomnia Continue ambien daily as needed  Chronic depression Continue fluoxetine and prn lorazepam, monitor  Patient currently on SSI with meals and at bedtime. She has no recent a1c. Was not on insulin prior at home. Discontinue her insulin for now. Follow with PCP   Labs/tests ordered: cbc,  cmp with PCP or GI  Family/ staff Communication: reviewed care plan with patient, her sister and nursing supervisor  Patient is being discharged with home health services:  Home health PT, OT, RN, HHA to help with gait training  and strengthening exercise, ileostomy site care and heparin administration for DVT prophylaxis.   Patient is being discharged with the following durable medical equipment:  3 in 1 commode. She has refused a walker and mentions having her sister's walker at home.   Patient has been advised to f/u with their PCP, GI and surgery team in 1-2 weeks to bring them up to date on their rehab stay.  she has been provide with prescription for tinidazole. she is not being provided with any other prescription as she is going home with her medications ordered during her stay at the facility. Refills must be obtained from their PCP.       Blanchie Serve, MD Internal Medicine [facility] [address] [place], [place] [address] Cell Phone (Monday-Friday 8 am - 5 pm): [phone] On Call: [phone] and follow prompts after 5 pm and on weekends Office Phone: [phone] Office Fax: [phone]

## 2016-03-03 NOTE — Telephone Encounter (Signed)
I discussed with Dr. Hilarie Fredrickson.  He received a call from Dr. Scharlene Gloss at [facility].  Family and patient are not happy with [facility] due to the care that was provided over the weekend. Dr. Scharlene Gloss is working on arranging home health admission and the patient should be discharged today.    Left message for patient to call back  Dr. Hilarie Fredrickson would like to see the patient in the office at 4 pm on 11/15.  Need to discuss with the patient and/or family and she will need to be added to the schedule.

## 2016-03-04 ENCOUNTER — Telehealth: Payer: Self-pay | Admitting: Internal Medicine

## 2016-03-04 NOTE — Telephone Encounter (Signed)
This was for DVT prophylaxis If she is up and moving around once home, then this should not be necessary.   I encourage her to continue to be out of bed as much as possible/tolerated (with assistance as needed per home health)

## 2016-03-04 NOTE — Telephone Encounter (Signed)
Spoke with pts sister and she is aware, states pt is up and moving about.

## 2016-03-05 DIAGNOSIS — E440 Moderate protein-calorie malnutrition: Secondary | ICD-10-CM | POA: Diagnosis not present

## 2016-03-05 DIAGNOSIS — K5000 Crohn's disease of small intestine without complications: Secondary | ICD-10-CM | POA: Diagnosis not present

## 2016-03-05 DIAGNOSIS — Z432 Encounter for attention to ileostomy: Secondary | ICD-10-CM | POA: Diagnosis not present

## 2016-03-05 DIAGNOSIS — F339 Major depressive disorder, recurrent, unspecified: Secondary | ICD-10-CM | POA: Diagnosis not present

## 2016-03-05 DIAGNOSIS — Z853 Personal history of malignant neoplasm of breast: Secondary | ICD-10-CM | POA: Diagnosis not present

## 2016-03-05 DIAGNOSIS — Z85828 Personal history of other malignant neoplasm of skin: Secondary | ICD-10-CM | POA: Diagnosis not present

## 2016-03-05 DIAGNOSIS — K219 Gastro-esophageal reflux disease without esophagitis: Secondary | ICD-10-CM | POA: Diagnosis not present

## 2016-03-06 DIAGNOSIS — Z85828 Personal history of other malignant neoplasm of skin: Secondary | ICD-10-CM | POA: Diagnosis not present

## 2016-03-06 DIAGNOSIS — Z432 Encounter for attention to ileostomy: Secondary | ICD-10-CM | POA: Diagnosis not present

## 2016-03-06 DIAGNOSIS — K219 Gastro-esophageal reflux disease without esophagitis: Secondary | ICD-10-CM | POA: Diagnosis not present

## 2016-03-06 DIAGNOSIS — E440 Moderate protein-calorie malnutrition: Secondary | ICD-10-CM | POA: Diagnosis not present

## 2016-03-06 DIAGNOSIS — Z853 Personal history of malignant neoplasm of breast: Secondary | ICD-10-CM | POA: Diagnosis not present

## 2016-03-06 DIAGNOSIS — K5000 Crohn's disease of small intestine without complications: Secondary | ICD-10-CM | POA: Diagnosis not present

## 2016-03-06 DIAGNOSIS — F339 Major depressive disorder, recurrent, unspecified: Secondary | ICD-10-CM | POA: Diagnosis not present

## 2016-03-07 ENCOUNTER — Telehealth: Payer: Self-pay | Admitting: Internal Medicine

## 2016-03-07 DIAGNOSIS — Z432 Encounter for attention to ileostomy: Secondary | ICD-10-CM | POA: Diagnosis not present

## 2016-03-07 DIAGNOSIS — E440 Moderate protein-calorie malnutrition: Secondary | ICD-10-CM | POA: Diagnosis not present

## 2016-03-07 DIAGNOSIS — Z853 Personal history of malignant neoplasm of breast: Secondary | ICD-10-CM | POA: Diagnosis not present

## 2016-03-07 DIAGNOSIS — K219 Gastro-esophageal reflux disease without esophagitis: Secondary | ICD-10-CM | POA: Diagnosis not present

## 2016-03-07 DIAGNOSIS — F339 Major depressive disorder, recurrent, unspecified: Secondary | ICD-10-CM | POA: Diagnosis not present

## 2016-03-07 DIAGNOSIS — K5000 Crohn's disease of small intestine without complications: Secondary | ICD-10-CM | POA: Diagnosis not present

## 2016-03-07 DIAGNOSIS — Z85828 Personal history of other malignant neoplasm of skin: Secondary | ICD-10-CM | POA: Diagnosis not present

## 2016-03-07 NOTE — Telephone Encounter (Signed)
Spoke with pts sister and she is aware and they will try this and let us know if it does not help.

## 2016-03-07 NOTE — Telephone Encounter (Signed)
I see she has prescribed zofran (34m tabs). She should take 849mzofran prior to dosing of Tinidazole to see if this helps and if she can tolerate it. If not and it does not help, she should let usKoreanow. Thanks

## 2016-03-07 NOTE — Telephone Encounter (Signed)
Pt was prescribed Tinidazole. She started taking it 03/04/16 but states it is making her very nauseated. She did not take it last night and this morning she feels much better and was able to eat. Please advise regarding Tinidazole as doc of the day.

## 2016-03-09 DIAGNOSIS — Z85828 Personal history of other malignant neoplasm of skin: Secondary | ICD-10-CM | POA: Diagnosis not present

## 2016-03-09 DIAGNOSIS — Z432 Encounter for attention to ileostomy: Secondary | ICD-10-CM | POA: Diagnosis not present

## 2016-03-09 DIAGNOSIS — K219 Gastro-esophageal reflux disease without esophagitis: Secondary | ICD-10-CM | POA: Diagnosis not present

## 2016-03-09 DIAGNOSIS — F339 Major depressive disorder, recurrent, unspecified: Secondary | ICD-10-CM | POA: Diagnosis not present

## 2016-03-09 DIAGNOSIS — K5000 Crohn's disease of small intestine without complications: Secondary | ICD-10-CM | POA: Diagnosis not present

## 2016-03-09 DIAGNOSIS — Z853 Personal history of malignant neoplasm of breast: Secondary | ICD-10-CM | POA: Diagnosis not present

## 2016-03-09 DIAGNOSIS — E440 Moderate protein-calorie malnutrition: Secondary | ICD-10-CM | POA: Diagnosis not present

## 2016-03-10 ENCOUNTER — Telehealth: Payer: Self-pay | Admitting: Internal Medicine

## 2016-03-10 NOTE — Telephone Encounter (Signed)
She can remain off of tinidazole until she sees me this Wednesday We will discuss further at that time

## 2016-03-10 NOTE — Telephone Encounter (Signed)
Pt calling back, states she tried the recommendations by Dr. Havery Moros but is still having a lot of nausea and does not want to eat. Please advise. See note below:  [name], MD  to Me     03/07/16 12:03 PM  Note    I see she has prescribed zofran (82m tabs). She should take 834mzofran prior to dosing of Tinidazole to see if this helps and if she can tolerate it. If not and it does not help, she should let usKoreanow. Thanks

## 2016-03-10 NOTE — Telephone Encounter (Signed)
Spoke with pt and she is aware.

## 2016-03-11 DIAGNOSIS — F339 Major depressive disorder, recurrent, unspecified: Secondary | ICD-10-CM | POA: Diagnosis not present

## 2016-03-11 DIAGNOSIS — K5000 Crohn's disease of small intestine without complications: Secondary | ICD-10-CM | POA: Diagnosis not present

## 2016-03-11 DIAGNOSIS — E440 Moderate protein-calorie malnutrition: Secondary | ICD-10-CM | POA: Diagnosis not present

## 2016-03-11 DIAGNOSIS — Z432 Encounter for attention to ileostomy: Secondary | ICD-10-CM | POA: Diagnosis not present

## 2016-03-11 DIAGNOSIS — Z85828 Personal history of other malignant neoplasm of skin: Secondary | ICD-10-CM | POA: Diagnosis not present

## 2016-03-11 DIAGNOSIS — Z853 Personal history of malignant neoplasm of breast: Secondary | ICD-10-CM | POA: Diagnosis not present

## 2016-03-11 DIAGNOSIS — K219 Gastro-esophageal reflux disease without esophagitis: Secondary | ICD-10-CM | POA: Diagnosis not present

## 2016-03-12 ENCOUNTER — Ambulatory Visit (INDEPENDENT_AMBULATORY_CARE_PROVIDER_SITE_OTHER): Payer: Commercial Managed Care - HMO | Admitting: Internal Medicine

## 2016-03-12 ENCOUNTER — Other Ambulatory Visit (INDEPENDENT_AMBULATORY_CARE_PROVIDER_SITE_OTHER): Payer: Commercial Managed Care - HMO

## 2016-03-12 ENCOUNTER — Encounter: Payer: Self-pay | Admitting: Internal Medicine

## 2016-03-12 VITALS — BP 114/70 | HR 100 | Ht 66.0 in | Wt 134.4 lb

## 2016-03-12 DIAGNOSIS — K523 Indeterminate colitis: Secondary | ICD-10-CM | POA: Diagnosis not present

## 2016-03-12 DIAGNOSIS — R110 Nausea: Secondary | ICD-10-CM

## 2016-03-12 DIAGNOSIS — E440 Moderate protein-calorie malnutrition: Secondary | ICD-10-CM | POA: Diagnosis not present

## 2016-03-12 DIAGNOSIS — K51814 Other ulcerative colitis with abscess: Secondary | ICD-10-CM

## 2016-03-12 DIAGNOSIS — K611 Rectal abscess: Secondary | ICD-10-CM | POA: Diagnosis not present

## 2016-03-12 DIAGNOSIS — Z932 Ileostomy status: Secondary | ICD-10-CM

## 2016-03-12 LAB — COMPREHENSIVE METABOLIC PANEL
ALBUMIN: 3.4 g/dL — AB (ref 3.5–5.2)
ALT: 18 U/L (ref 0–35)
AST: 15 U/L (ref 0–37)
Alkaline Phosphatase: 99 U/L (ref 39–117)
BILIRUBIN TOTAL: 0.3 mg/dL (ref 0.2–1.2)
BUN: 5 mg/dL — ABNORMAL LOW (ref 6–23)
CALCIUM: 9.3 mg/dL (ref 8.4–10.5)
CHLORIDE: 98 meq/L (ref 96–112)
CO2: 26 meq/L (ref 19–32)
CREATININE: 0.74 mg/dL (ref 0.40–1.20)
GFR: 81.46 mL/min (ref >60.00)
Glucose, Bld: 104 mg/dL — ABNORMAL HIGH (ref 70–99)
Potassium: 4.1 mEq/L (ref 3.5–5.1)
Sodium: 132 mEq/L — ABNORMAL LOW (ref 135–145)
Total Protein: 6.7 g/dL (ref 6.0–8.3)

## 2016-03-12 LAB — CBC WITH DIFFERENTIAL/PLATELET
BASOS ABS: 0.1 10*3/uL (ref 0.0–0.1)
Basophils Relative: 0.6 % (ref 0.0–3.0)
EOS ABS: 0.1 10*3/uL (ref 0.0–0.7)
Eosinophils Relative: 0.6 % (ref 0.0–5.0)
HEMATOCRIT: 36.1 % (ref 36.0–46.0)
HEMOGLOBIN: 12.1 g/dL (ref 12.0–15.0)
LYMPHS PCT: 22.2 % (ref 12.0–46.0)
Lymphs Abs: 2.8 10*3/uL (ref 0.7–4.0)
MCHC: 33.6 g/dL (ref 30.0–36.0)
MCV: 86.7 fl (ref 78.0–100.0)
MONO ABS: 1.4 10*3/uL — AB (ref 0.1–1.0)
Monocytes Relative: 10.9 % (ref 3.0–12.0)
Neutro Abs: 8.4 10*3/uL — ABNORMAL HIGH (ref 1.4–7.7)
Neutrophils Relative %: 65.7 % (ref 43.0–77.0)
Platelets: 345 10*3/uL (ref 150.0–400.0)
RBC: 4.16 Mil/uL (ref 3.87–5.11)
RDW: 14.7 % (ref 11.5–15.5)
WBC: 12.8 10*3/uL — AB (ref 4.0–10.5)

## 2016-03-12 LAB — HIGH SENSITIVITY CRP: CRP HIGH SENSITIVITY: 21.96 mg/L — AB (ref 0.000–5.000)

## 2016-03-12 MED ORDER — HYDROCODONE-ACETAMINOPHEN 5-325 MG PO TABS: 1.0000 | ORAL_TABLET | Freq: Four times a day (QID) | ORAL | 0 refills | Status: DC | PRN

## 2016-03-12 MED ORDER — MORPHINE SULFATE ER 15 MG PO TBCR: 15.0000 mg | EXTENDED_RELEASE_TABLET | Freq: Two times a day (BID) | ORAL | 0 refills | Status: DC

## 2016-03-12 MED ORDER — METOCLOPRAMIDE HCL 5 MG PO TABS: 5.0000 mg | ORAL_TABLET | Freq: Four times a day (QID) | ORAL | 0 refills | Status: DC | PRN

## 2016-03-12 MED ORDER — ONDANSETRON 8 MG PO TBDP: 8.0000 mg | ORAL_TABLET | Freq: Three times a day (TID) | ORAL | 2 refills | Status: DC | PRN

## 2016-03-12 MED ORDER — MORPHINE SULFATE ER 30 MG PO TBCR: 30.0000 mg | EXTENDED_RELEASE_TABLET | Freq: Two times a day (BID) | ORAL | 0 refills | Status: DC

## 2016-03-12 MED ORDER — TINIDAZOLE 250 MG PO TABS: 250.0000 mg | ORAL_TABLET | Freq: Two times a day (BID) | ORAL | 3 refills | Status: DC

## 2016-03-12 NOTE — Patient Instructions (Addendum)
Your physician has requested that you go to the basement for the following lab work before leaving today:  CBC, CRP, CMP  We have sent the following medications to your pharmacy for you to pick up at your convenience:  Zofran, Hydrocodone, Reglan, Tinidazole, Morphine  Decrease your MS Contin to 23m  every 12 hours

## 2016-03-13 ENCOUNTER — Telehealth: Payer: Self-pay | Admitting: Internal Medicine

## 2016-03-13 MED ORDER — MORPHINE SULFATE ER 15 MG PO TBCR: 15.0000 mg | EXTENDED_RELEASE_TABLET | Freq: Two times a day (BID) | ORAL | 0 refills | Status: DC

## 2016-03-13 NOTE — Telephone Encounter (Signed)
Pt was supposed to have MS Contin dose decreased to 51m. Pt to bring MS Contin 358mscript back and pick up the 158mcript. Script left up front for pick up.

## 2016-03-14 DIAGNOSIS — K5000 Crohn's disease of small intestine without complications: Secondary | ICD-10-CM | POA: Diagnosis not present

## 2016-03-14 DIAGNOSIS — Z432 Encounter for attention to ileostomy: Secondary | ICD-10-CM | POA: Diagnosis not present

## 2016-03-14 DIAGNOSIS — K219 Gastro-esophageal reflux disease without esophagitis: Secondary | ICD-10-CM | POA: Diagnosis not present

## 2016-03-14 DIAGNOSIS — Z85828 Personal history of other malignant neoplasm of skin: Secondary | ICD-10-CM | POA: Diagnosis not present

## 2016-03-14 DIAGNOSIS — E440 Moderate protein-calorie malnutrition: Secondary | ICD-10-CM | POA: Diagnosis not present

## 2016-03-14 DIAGNOSIS — Z853 Personal history of malignant neoplasm of breast: Secondary | ICD-10-CM | POA: Diagnosis not present

## 2016-03-14 DIAGNOSIS — F339 Major depressive disorder, recurrent, unspecified: Secondary | ICD-10-CM | POA: Diagnosis not present

## 2016-03-16 DIAGNOSIS — Z85828 Personal history of other malignant neoplasm of skin: Secondary | ICD-10-CM | POA: Diagnosis not present

## 2016-03-16 DIAGNOSIS — K5000 Crohn's disease of small intestine without complications: Secondary | ICD-10-CM | POA: Diagnosis not present

## 2016-03-16 DIAGNOSIS — Z853 Personal history of malignant neoplasm of breast: Secondary | ICD-10-CM | POA: Diagnosis not present

## 2016-03-16 DIAGNOSIS — Z432 Encounter for attention to ileostomy: Secondary | ICD-10-CM | POA: Diagnosis not present

## 2016-03-16 DIAGNOSIS — F339 Major depressive disorder, recurrent, unspecified: Secondary | ICD-10-CM | POA: Diagnosis not present

## 2016-03-16 DIAGNOSIS — K219 Gastro-esophageal reflux disease without esophagitis: Secondary | ICD-10-CM | POA: Diagnosis not present

## 2016-03-16 DIAGNOSIS — E440 Moderate protein-calorie malnutrition: Secondary | ICD-10-CM | POA: Diagnosis not present

## 2016-03-17 ENCOUNTER — Telehealth: Payer: Self-pay | Admitting: Internal Medicine

## 2016-03-17 NOTE — Telephone Encounter (Signed)
Dr. Hilarie Fredrickson please see note below. Where do you want to schedule pt? You do not have any appts available.

## 2016-03-17 NOTE — Progress Notes (Signed)
Subjective:    Patient ID: Sara Barnes, female    DOB: 01/12/1941, 75 y.o.   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 She is here today with her sister. Sara Barnes was admitted to the hospital in late August after developing bloody diarrhea. This hospitalization was complicated by large perirectal/perineal abscess requiring extensive incision, drainage and drain placement. She was given Remicade 2 in the hospital and was on IV steroids, antibiotics. 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 Hassell Done on 02/11/2016. The postop course was complicated by ileus but eventually she was discharged to rehabilitation facility. Unfortunately this occurred after 5 PM on a Friday and she had a rocky start to her rehabilitation experience. It seems that there was miscommunication versus dishonesty with staff there regarding which medication she was receiving. Trust was lost and she and her sister checked out the Monday after being admitted. She is now at home living with her sister.  She reports that she still struggling in general. Her perianal/perirectal rectal drainage is much reduced. The pain and this area is also significantly better. She finds the drain to be "irritating". Her ostomy seems to be functioning well without pain or blood in her effluent.  She does still with daily nausea. She is trying to drink "nutrition shakes" and Gatorade. Appetite seems to fluctuate though she is eating. Yesterday she ate soup and three quarters of a grilled cheese sandwich. This morning she ate a half of a bacon sandwich and hashbrowns and she developed nausea. Her nausea seems to be worse with overexertion and if she tries to do "too much". She does have home health nursing, PT coming 2-3 days per week. She is able to climb  stairs with assistance. Again, her sister Sara Barnes is staying with her. Her spirits remain down and she talks frequently of wanting to get rid of her ostomy. She also would like to have her perianal drain removed. She is seeing Dr. Hassell Done tomorrow in follow-up. She was sent home on tinidazole but she has stopped this because she thought possibly it causes nausea. She continues MS Contin 30 mg every 12 hours. She continues famotidine 40 mg twice daily. Fluoxetine 40 mg daily. Occasional Ativan and Ambien and Zofran.  Review of Systems As per history of present illness, otherwise negative  Current Medications, Allergies, Past Medical History, Past Surgical History, Family History and Social History were reviewed in [employer] record.     Objective:   Physical Exam BP 114/70   Pulse 100   Ht 5' 6"  (1.676 m)   Wt 134 lb 6 oz (61 kg)   BMI 21.69 kg/m  Constitutional: Well-developed and well-nourished, though chronically ill-appearing female. No distress. HEENT: Normocephalic and atraumatic. Oropharynx is clear and moist. No oropharyngeal exudate. Conjunctivae are pale.   No scleral icterus. Neck: Neck supple. Trachea midline. Cardiovascular: Normal rate, regular rhythm and intact distal pulses. No M/R/G Pulmonary/chest: Effort normal and breath sounds normal. No wheezing, rales or rhonchi. Abdominal: Soft,  ostomy in place right lower quadrant appears healthy and normal, nontender, nondistended. Bowel sounds active throughout. There are no masses palpable.  Extremities: no clubbing, cyanosis, or edema Lymphadenopathy: No cervical adenopathy noted. Neurological: Alert and oriented to person place and time. Skin: Skin is warm and dryWith scaling . No rashes noted.  Psychiatric: depressed mood and somewhat flat affect, intermittently tearful throughout the encounter. Behavior is normal.  CBC    Component Value Date/Time   WBC 12.8 (H) 03/12/2016 1710   RBC 4.16 03/12/2016  1710   HGB 12.1 03/12/2016 1710   HGB 13.7 12/06/2014 0804   HCT 36.1 03/12/2016 1710   HCT 41.1 12/06/2014 0804   PLT 345.0 03/12/2016 1710   PLT 303 12/06/2014 0804   MCV 86.7 03/12/2016 1710   MCV 95.8 12/06/2014 0804   MCH 29.1 02/26/2016 0433   MCHC 33.6 03/12/2016 1710   RDW 14.7 03/12/2016 1710   RDW 12.2 12/06/2014 0804   LYMPHSABS 2.8 03/12/2016 1710   LYMPHSABS 1.8 12/06/2014 0804   MONOABS 1.4 (H) 03/12/2016 1710   MONOABS 0.8 12/06/2014 0804   EOSABS 0.1 03/12/2016 1710   EOSABS 0.2 12/06/2014 0804   BASOSABS 0.1 03/12/2016 1710   BASOSABS 0.0 12/06/2014 0804   CMP     Component Value Date/Time   NA 132 (L) 03/12/2016 1710   NA 138 12/06/2014 0804   K 4.1 03/12/2016 1710   K 4.1 12/06/2014 0804   CL 98 03/12/2016 1710   CO2 26 03/12/2016 1710   CO2 25 12/06/2014 0804   GLUCOSE 104 (H) 03/12/2016 1710   GLUCOSE 92 12/06/2014 0804   BUN 5 (L) 03/12/2016 1710   BUN 12.3 12/06/2014 0804   CREATININE 0.74 03/12/2016 1710   CREATININE 0.8 12/06/2014 0804   CALCIUM 9.3 03/12/2016 1710   CALCIUM 9.4 12/06/2014 0804   PROT 6.7 03/12/2016 1710   PROT 6.5 12/06/2014 0804   ALBUMIN 3.4 (L) 03/12/2016 1710   ALBUMIN 3.3 (L) 12/06/2014 0804   AST 15 03/12/2016 1710   AST 9 12/06/2014 0804   ALT 18 03/12/2016 1710   ALT 10 12/06/2014 0804   ALKPHOS 99 03/12/2016 1710   ALKPHOS 89 12/06/2014 0804   BILITOT 0.3 03/12/2016 1710   BILITOT 0.37 12/06/2014 0804   GFRNONAA >60 02/27/2016 0955   GFRAA >60 02/27/2016 0955   CRP 20     Assessment & Plan:  75 year old female with what was felt to be long-standing also give colitis versus Crohn's disease complicated by perirectal abscess who is seen in follow-up after a prolonged hospitalization.   1. 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 She remains debilitated which is  not unexpected after prolonged hospitalization. She is now off of steroids. I have recommended that we resume Remicade infusion but she remains very hesitant to do this. She feels that Remicade made her "sicker" when she was in the hospital. I have reminded her that my opinion is that her colitis and abscess made her sicker and not the Remicade. I am uncomfortable with her being on no medical therapy and would like to avoid steroids. This goes back to the issue prior to her hospitalization and complications, specifically the fact that she was very hesitant and would not allow [place] to escalate therapy despite our recommendations. This issue dates back when she was seen by Dr. Velora Heckler, Dr. Olevia Perches and a short-time with Dr. Havery Moros.  I offered to schedule recurrent Remicade infusion but she would wish to delay this for now. She understands my recommendation.  I do think she needs to resume antibiotic in the setting of her perirectal abscesses which are healing with drainage and ileal diversion. I will resume tinidazole 250 mg twice a day  and expect this to be long-term.   I do think that she is improving though slowly. I reminded her that she was in the hospital for a long period of time and checked out of rehab after only 48 hours. Her recovery will continue to be slow. She needs to continue home nursing and home physical therapy.  From a nutrition standpoint I have recommended that she eat a bland diet, high calorie diet. She was given multiple samples of ScandiShake which is a lactose-free instant shake mix which is high calorie (each packet contains 530 cal, 20 g of fat, 54 g of carbohydrate and 7 g of protein).  I encouraged her to try to do 2 of these a day in addition to what she is eating.  I'm going to try to wean her off of her MS Contin as I feel overall her pain has improved and this medication is likely contributing to nausea. Decrease MS Contin to 15 mg daily. Hydrocodone 5 mg given to be used for  breakthrough pain only 1 tablet every 4-6 hours as needed. Continue Zofran on an as-needed basis for nausea. Add Reglan 5 mg 2-3 times daily on an as-needed basis for nausea not relieved by Zofran.  CBC, CMP and CRP checked today.  She has follow-up with Dr. Hassell Done. We had a long discussion about her end ileostomy. 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 I have again reminded her that she is currently off of all colitis therapy and that in order to ever consider having her ostomy reversed we need to get her on appropriate medication for colitis.  I would like to see her back in 2-3 weeks. I will have to work her into clinic.  60 minutes spent with the patient today. Greater than 50% was spent in counseling and coordination of care with the patient

## 2016-03-18 ENCOUNTER — Telehealth: Payer: Self-pay

## 2016-03-18 DIAGNOSIS — F339 Major depressive disorder, recurrent, unspecified: Secondary | ICD-10-CM | POA: Diagnosis not present

## 2016-03-18 DIAGNOSIS — K5000 Crohn's disease of small intestine without complications: Secondary | ICD-10-CM | POA: Diagnosis not present

## 2016-03-18 DIAGNOSIS — Z432 Encounter for attention to ileostomy: Secondary | ICD-10-CM | POA: Diagnosis not present

## 2016-03-18 DIAGNOSIS — Z85828 Personal history of other malignant neoplasm of skin: Secondary | ICD-10-CM | POA: Diagnosis not present

## 2016-03-18 DIAGNOSIS — K219 Gastro-esophageal reflux disease without esophagitis: Secondary | ICD-10-CM | POA: Diagnosis not present

## 2016-03-18 DIAGNOSIS — Z853 Personal history of malignant neoplasm of breast: Secondary | ICD-10-CM | POA: Diagnosis not present

## 2016-03-18 DIAGNOSIS — E440 Moderate protein-calorie malnutrition: Secondary | ICD-10-CM | POA: Diagnosis not present

## 2016-03-18 NOTE — Telephone Encounter (Signed)
I am fine with the 15 mg dose once daily of MSContin, also understandable if she needs this twice daily (12 hrs apart) or some days, or could use the recently rx'ed vicodin for breakthrough (this is a short acting narcotic 4-6 hrs, whereas MSContin in a 12 hr narcotic) The clear drainage is not unexpected and could have been from the drain location or even from the rectum. Let's try to work her in on Dec 12 in the am. [facility]

## 2016-03-18 NOTE — Telephone Encounter (Signed)
Spoke with pt and she is aware. Pt scheduled for OV with Dr. Hilarie Fredrickson 04/08/16@11 :15am.

## 2016-03-18 NOTE — Telephone Encounter (Signed)
Patient sister states that patient had mucous come out of her rectum and is wanting to know what she should do

## 2016-03-18 NOTE — Telephone Encounter (Signed)
Pts sister called this morning and states that Sara Barnes tried taking only one pain pill yesterday, 68m, and did ok. She would like to do this but wanted to make sure Dr. PHilarie Fredricksonis ok with this.   Also report that last night pt had some clear watery mucous drainage that either came from where Dr. MHassell Doneremoved her drain or from her rectum. Pt asking what they need to do about this. Also need to know where Dr. PHilarie Fredricksonwould like her scheduled. Please advise.

## 2016-03-19 ENCOUNTER — Telehealth: Payer: Self-pay | Admitting: Internal Medicine

## 2016-03-19 DIAGNOSIS — E440 Moderate protein-calorie malnutrition: Secondary | ICD-10-CM | POA: Diagnosis not present

## 2016-03-19 DIAGNOSIS — Z853 Personal history of malignant neoplasm of breast: Secondary | ICD-10-CM | POA: Diagnosis not present

## 2016-03-19 DIAGNOSIS — K5000 Crohn's disease of small intestine without complications: Secondary | ICD-10-CM | POA: Diagnosis not present

## 2016-03-19 DIAGNOSIS — K219 Gastro-esophageal reflux disease without esophagitis: Secondary | ICD-10-CM | POA: Diagnosis not present

## 2016-03-19 DIAGNOSIS — F339 Major depressive disorder, recurrent, unspecified: Secondary | ICD-10-CM | POA: Diagnosis not present

## 2016-03-19 DIAGNOSIS — Z85828 Personal history of other malignant neoplasm of skin: Secondary | ICD-10-CM | POA: Diagnosis not present

## 2016-03-19 DIAGNOSIS — Z432 Encounter for attention to ileostomy: Secondary | ICD-10-CM | POA: Diagnosis not present

## 2016-03-19 NOTE — Telephone Encounter (Signed)
Pt going out of town next week, home health cancelled for that week but will resume the following week. Pt wanted to know if she could take one of the long acting pain pills every other day. Discussed with her that if she does not need it every day then it would be fine for her to try taking it every other day. Pt verbalized understanding.

## 2016-03-28 DIAGNOSIS — Z85828 Personal history of other malignant neoplasm of skin: Secondary | ICD-10-CM | POA: Diagnosis not present

## 2016-03-28 DIAGNOSIS — K5000 Crohn's disease of small intestine without complications: Secondary | ICD-10-CM | POA: Diagnosis not present

## 2016-03-28 DIAGNOSIS — K219 Gastro-esophageal reflux disease without esophagitis: Secondary | ICD-10-CM | POA: Diagnosis not present

## 2016-03-28 DIAGNOSIS — E440 Moderate protein-calorie malnutrition: Secondary | ICD-10-CM | POA: Diagnosis not present

## 2016-03-28 DIAGNOSIS — F339 Major depressive disorder, recurrent, unspecified: Secondary | ICD-10-CM | POA: Diagnosis not present

## 2016-03-28 DIAGNOSIS — Z432 Encounter for attention to ileostomy: Secondary | ICD-10-CM | POA: Diagnosis not present

## 2016-03-28 DIAGNOSIS — Z853 Personal history of malignant neoplasm of breast: Secondary | ICD-10-CM | POA: Diagnosis not present

## 2016-03-31 ENCOUNTER — Telehealth: Payer: Self-pay | Admitting: Internal Medicine

## 2016-03-31 DIAGNOSIS — Z432 Encounter for attention to ileostomy: Secondary | ICD-10-CM | POA: Diagnosis not present

## 2016-03-31 DIAGNOSIS — Z85828 Personal history of other malignant neoplasm of skin: Secondary | ICD-10-CM | POA: Diagnosis not present

## 2016-03-31 DIAGNOSIS — K5000 Crohn's disease of small intestine without complications: Secondary | ICD-10-CM | POA: Diagnosis not present

## 2016-03-31 DIAGNOSIS — F339 Major depressive disorder, recurrent, unspecified: Secondary | ICD-10-CM | POA: Diagnosis not present

## 2016-03-31 DIAGNOSIS — K219 Gastro-esophageal reflux disease without esophagitis: Secondary | ICD-10-CM | POA: Diagnosis not present

## 2016-03-31 DIAGNOSIS — E440 Moderate protein-calorie malnutrition: Secondary | ICD-10-CM | POA: Diagnosis not present

## 2016-03-31 DIAGNOSIS — Z853 Personal history of malignant neoplasm of breast: Secondary | ICD-10-CM | POA: Diagnosis not present

## 2016-03-31 MED ORDER — FLUOXETINE HCL 20 MG PO CAPS: 20.0000 mg | ORAL_CAPSULE | Freq: Two times a day (BID) | ORAL | 3 refills | Status: DC

## 2016-03-31 MED ORDER — TINIDAZOLE 250 MG PO TABS: 250.0000 mg | ORAL_TABLET | Freq: Two times a day (BID) | ORAL | 3 refills | Status: DC

## 2016-03-31 NOTE — Telephone Encounter (Signed)
Pt states she is almost out of tinidazole and per Dr. Vena Rua OV note it states she will be on this long term. Script refilled for pt. Pt also requesting new script for her Prozac, states Dr. Hilarie Fredrickson increased this to 36m daily while she was inpt. New script sent to pharmacy for pt.

## 2016-03-31 NOTE — Telephone Encounter (Signed)
Received call from [facility] physical therapist stating that the pt is not feeling as well. States she is having increased weakness, fatigue, poor appetite, not sleeping as well. States her resting heart rate is 95-100, has been 78-80 at rest. Pt decided to decrease her morphine to 7.31m daily yesterday on her own. Home health nurse will see pt tomorrow. Dr. PHilarie Fredricksonnotified.

## 2016-03-31 NOTE — Telephone Encounter (Signed)
Left message for pt to call back  °

## 2016-04-01 ENCOUNTER — Telehealth: Payer: Self-pay

## 2016-04-01 DIAGNOSIS — Z432 Encounter for attention to ileostomy: Secondary | ICD-10-CM | POA: Diagnosis not present

## 2016-04-01 DIAGNOSIS — Z853 Personal history of malignant neoplasm of breast: Secondary | ICD-10-CM | POA: Diagnosis not present

## 2016-04-01 DIAGNOSIS — E440 Moderate protein-calorie malnutrition: Secondary | ICD-10-CM | POA: Diagnosis not present

## 2016-04-01 DIAGNOSIS — K5000 Crohn's disease of small intestine without complications: Secondary | ICD-10-CM | POA: Diagnosis not present

## 2016-04-01 DIAGNOSIS — F339 Major depressive disorder, recurrent, unspecified: Secondary | ICD-10-CM | POA: Diagnosis not present

## 2016-04-01 DIAGNOSIS — K219 Gastro-esophageal reflux disease without esophagitis: Secondary | ICD-10-CM | POA: Diagnosis not present

## 2016-04-01 DIAGNOSIS — Z85828 Personal history of other malignant neoplasm of skin: Secondary | ICD-10-CM | POA: Diagnosis not present

## 2016-04-01 NOTE — Telephone Encounter (Signed)
No, for opinion re: management of IBD I had in person discussion with Dr. Drue Flirt and would like her opinion regarding her recurrent abscesses, infections, and now with ostomy surgical opinion about resuming biologics

## 2016-04-01 NOTE — Telephone Encounter (Signed)
Is the referral to Dr. Drue Flirt to reverse the Ileostomy?

## 2016-04-01 NOTE — Telephone Encounter (Signed)
Discontinue tinidazole for now Continue nutritional supplements Has appt with Dr. Drue Flirt been made, if not, please do so

## 2016-04-01 NOTE — Telephone Encounter (Signed)
[facility] RN, Jenny Reichmann, called and states pt is getting weaker, has no appetite and is not eating much at all. She reports pt is nauseated at times. States she thinks the antibiotic the pt is on is causing this and wants to know if pt needs to continue on the tinidazole. Feels this may be making her feel bad, decreasing her appetite and causing the nausea. Please advise.

## 2016-04-02 DIAGNOSIS — F339 Major depressive disorder, recurrent, unspecified: Secondary | ICD-10-CM | POA: Diagnosis not present

## 2016-04-02 DIAGNOSIS — Z853 Personal history of malignant neoplasm of breast: Secondary | ICD-10-CM | POA: Diagnosis not present

## 2016-04-02 DIAGNOSIS — K219 Gastro-esophageal reflux disease without esophagitis: Secondary | ICD-10-CM | POA: Diagnosis not present

## 2016-04-02 DIAGNOSIS — E440 Moderate protein-calorie malnutrition: Secondary | ICD-10-CM | POA: Diagnosis not present

## 2016-04-02 DIAGNOSIS — Z85828 Personal history of other malignant neoplasm of skin: Secondary | ICD-10-CM | POA: Diagnosis not present

## 2016-04-02 DIAGNOSIS — Z432 Encounter for attention to ileostomy: Secondary | ICD-10-CM | POA: Diagnosis not present

## 2016-04-02 DIAGNOSIS — K5000 Crohn's disease of small intestine without complications: Secondary | ICD-10-CM | POA: Diagnosis not present

## 2016-04-02 NOTE — Telephone Encounter (Signed)
Pt scheduled to see Dr. Drue Flirt at [facility] 05/06/16@1 :30pm. Pt needs to arrive there 30mn early. [facility] Spoke with pt and she is aware.

## 2016-04-02 NOTE — Telephone Encounter (Signed)
Patient did bring MS Contin 30 mg script back. Script destroyed.

## 2016-04-07 DIAGNOSIS — Z853 Personal history of malignant neoplasm of breast: Secondary | ICD-10-CM | POA: Diagnosis not present

## 2016-04-07 DIAGNOSIS — F339 Major depressive disorder, recurrent, unspecified: Secondary | ICD-10-CM | POA: Diagnosis not present

## 2016-04-07 DIAGNOSIS — Z432 Encounter for attention to ileostomy: Secondary | ICD-10-CM | POA: Diagnosis not present

## 2016-04-07 DIAGNOSIS — K219 Gastro-esophageal reflux disease without esophagitis: Secondary | ICD-10-CM | POA: Diagnosis not present

## 2016-04-07 DIAGNOSIS — K5000 Crohn's disease of small intestine without complications: Secondary | ICD-10-CM | POA: Diagnosis not present

## 2016-04-07 DIAGNOSIS — E440 Moderate protein-calorie malnutrition: Secondary | ICD-10-CM | POA: Diagnosis not present

## 2016-04-07 DIAGNOSIS — Z85828 Personal history of other malignant neoplasm of skin: Secondary | ICD-10-CM | POA: Diagnosis not present

## 2016-04-08 ENCOUNTER — Ambulatory Visit (INDEPENDENT_AMBULATORY_CARE_PROVIDER_SITE_OTHER): Payer: Commercial Managed Care - HMO | Admitting: Internal Medicine

## 2016-04-08 ENCOUNTER — Other Ambulatory Visit (INDEPENDENT_AMBULATORY_CARE_PROVIDER_SITE_OTHER): Payer: Commercial Managed Care - HMO

## 2016-04-08 ENCOUNTER — Encounter: Payer: Self-pay | Admitting: Internal Medicine

## 2016-04-08 VITALS — BP 116/74 | HR 100 | Ht 66.0 in | Wt 134.1 lb

## 2016-04-08 DIAGNOSIS — K51514 Left sided colitis with abscess: Secondary | ICD-10-CM

## 2016-04-08 DIAGNOSIS — R5383 Other fatigue: Secondary | ICD-10-CM

## 2016-04-08 DIAGNOSIS — Z23 Encounter for immunization: Secondary | ICD-10-CM

## 2016-04-08 DIAGNOSIS — K5190 Ulcerative colitis, unspecified, without complications: Secondary | ICD-10-CM

## 2016-04-08 DIAGNOSIS — Z87898 Personal history of other specified conditions: Secondary | ICD-10-CM

## 2016-04-08 DIAGNOSIS — Z939 Artificial opening status, unspecified: Secondary | ICD-10-CM

## 2016-04-08 LAB — COMPREHENSIVE METABOLIC PANEL
ALBUMIN: 4 g/dL (ref 3.5–5.2)
ALK PHOS: 81 U/L (ref 39–117)
ALT: 11 U/L (ref 0–35)
AST: 13 U/L (ref 0–37)
BUN: 8 mg/dL (ref 6–23)
CHLORIDE: 97 meq/L (ref 96–112)
CO2: 24 mEq/L (ref 19–32)
Calcium: 9.6 mg/dL (ref 8.4–10.5)
Creatinine, Ser: 0.6 mg/dL (ref 0.40–1.20)
GFR: 103.74 mL/min (ref >60.00)
GLUCOSE: 109 mg/dL — AB (ref 70–99)
POTASSIUM: 3.5 meq/L (ref 3.5–5.1)
SODIUM: 130 meq/L — AB (ref 135–145)
TOTAL PROTEIN: 7.2 g/dL (ref 6.0–8.3)
Total Bilirubin: 0.4 mg/dL (ref 0.2–1.2)

## 2016-04-08 LAB — CBC WITH DIFFERENTIAL/PLATELET
Basophils Absolute: 0.1 10*3/uL (ref 0.0–0.1)
Basophils Relative: 0.7 % (ref 0.0–3.0)
Eosinophils Absolute: 0.1 10*3/uL (ref 0.0–0.7)
Eosinophils Relative: 0.5 % (ref 0.0–5.0)
HCT: 39.8 % (ref 36.0–46.0)
Hemoglobin: 13.4 g/dL (ref 12.0–15.0)
Lymphocytes Relative: 19.9 % (ref 12.0–46.0)
Lymphs Abs: 3 10*3/uL (ref 0.7–4.0)
MCHC: 33.6 g/dL (ref 30.0–36.0)
MCV: 86.1 fl (ref 78.0–100.0)
Monocytes Absolute: 1.1 10*3/uL — ABNORMAL HIGH (ref 0.1–1.0)
Monocytes Relative: 7.2 % (ref 3.0–12.0)
Neutro Abs: 11 10*3/uL — ABNORMAL HIGH (ref 1.4–7.7)
Neutrophils Relative %: 71.7 % (ref 43.0–77.0)
Platelets: 472 10*3/uL — ABNORMAL HIGH (ref 150.0–400.0)
RBC: 4.62 Mil/uL (ref 3.87–5.11)
RDW: 14.7 % (ref 11.5–15.5)
WBC: 15.3 10*3/uL — ABNORMAL HIGH (ref 4.0–10.5)

## 2016-04-08 LAB — HIGH SENSITIVITY CRP: CRP HIGH SENSITIVITY: 6.74 mg/L — AB (ref 0.000–5.000)

## 2016-04-08 MED ORDER — PREDNISONE 10 MG PO TABS: ORAL_TABLET | ORAL | 0 refills | Status: DC

## 2016-04-08 MED ORDER — CLOBETASOL PROP EMOLLIENT BASE 0.05 % EX CREA: TOPICAL_CREAM | CUTANEOUS | 0 refills | Status: DC

## 2016-04-08 NOTE — Progress Notes (Signed)
Subjective:    Patient ID: Sara Barnes, female    DOB: [birth year], 75 y.o.   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 She was last in the office on 03/12/2016. She's here today with her sister.  She reports she continues to feel weak but feels that she has made improvement since last seeing me. She has weaned off all of her pain medications completely including MS Contin and hydrocodone. She is using Prozac 40 mg alternating with 20 mg daily, famotidine 40 mg twice daily and Ambien 5 mg at bedtime. She states she is not having issues with pain. She still feels "obsessed" with emptying her ostomy bag but has not had any blood per ostomy, peristomal pain. The protein shake she was using "make me sick". Her appetite has not returned to normal and she feels full quickly. She was having nausea but this is better since stopping tinidazole and protein shakes. She is drinking Gatorade, Sprite, water and lactose-free milk. She denies fever or chills.  Reports very poor sleep this is despite Ambien 5 mg at bedtime. She states she has trouble initiating and maintaining sleep. She estimates sleeping about 2 hours a night.  She reports rectal drainage which is daily. No blood but she has seen some brown mucus. She does not feel she is draining from the site where her Penrose drain was laced.  She has noticed a "boil" on her left shin. She has been putting antibiotic ointment on it. She feels it may be a little better. It is painful but not terribly. No other rashes.  She continues to work with physical therapy and feels her strength is improving. She's been walking in the neighborhood frequently and also able to climb stairs without assistance now.  Review of Systems As per history of present illness, otherwise negative  Current Medications, Allergies, Past  Medical History, Past Surgical History, Family History and Social History were reviewed in [employer] record.     Objective:   Physical Exam BP 116/74   Pulse 100   Ht 5' 6"  (1.676 m)   Wt 134 lb 2 oz (60.8 kg)   BMI 21.65 kg/m  Constitutional: Well-developed and well-nourished. Color is better. No distress. HEENT: Normocephalic and atraumatic. Oropharynx is clear and moist. No oropharyngeal exudate. Conjunctivae are normal.  No scleral icterus. Neck: Neck supple. Trachea midline. Cardiovascular: Normal rate, regular rhythm and intact distal pulses.  Pulmonary/chest: Effort normal and breath sounds normal. No wheezing, rales or rhonchi. Abdominal: Soft, nontender, nondistended. Bowel sounds active throughout. Ileostomy right lower quadrant Extremities: no clubbing, cyanosis, or edema Lymphadenopathy: No cervical adenopathy noted. Neurological: Alert and oriented to person place and time. Skin: Skin is warm and dry. nodule with very mild ulceration at the center on left anterior shin with surrounding erythema Psychiatric: Normal mood and affect. Behavior is normal.  CBC    Component Value Date/Time   WBC 15.3 (H) 04/08/2016 1221   RBC 4.62 04/08/2016 1221   HGB 13.4 04/08/2016 1221   HGB 13.7 12/06/2014 0804   HCT 39.8 04/08/2016 1221   HCT 41.1 12/06/2014 0804   PLT 472.0 (H) 04/08/2016 1221   PLT 303 12/06/2014 0804   MCV 86.1 04/08/2016 1221   MCV 95.8 12/06/2014 0804   MCH 29.1 02/26/2016 0433   MCHC 33.6 04/08/2016 1221   RDW 14.7 04/08/2016  1221   RDW 12.2 12/06/2014 0804   LYMPHSABS 3.0 04/08/2016 1221   LYMPHSABS 1.8 12/06/2014 0804   MONOABS 1.1 (H) 04/08/2016 1221   MONOABS 0.8 12/06/2014 0804   EOSABS 0.1 04/08/2016 1221   EOSABS 0.2 12/06/2014 0804   BASOSABS 0.1 04/08/2016 1221   BASOSABS 0.0 12/06/2014 0804   CMP     Component Value Date/Time   NA 130 (L) 04/08/2016 1221   NA 138 12/06/2014 0804   K 3.5 04/08/2016 1221   K  4.1 12/06/2014 0804   CL 97 04/08/2016 1221   CO2 24 04/08/2016 1221   CO2 25 12/06/2014 0804   GLUCOSE 109 (H) 04/08/2016 1221   GLUCOSE 92 12/06/2014 0804   BUN 8 04/08/2016 1221   BUN 12.3 12/06/2014 0804   CREATININE 0.60 04/08/2016 1221   CREATININE 0.8 12/06/2014 0804   CALCIUM 9.6 04/08/2016 1221   CALCIUM 9.4 12/06/2014 0804   PROT 7.2 04/08/2016 1221   PROT 6.5 12/06/2014 0804   ALBUMIN 4.0 04/08/2016 1221   ALBUMIN 3.3 (L) 12/06/2014 0804   AST 13 04/08/2016 1221   AST 9 12/06/2014 0804   ALT 11 04/08/2016 1221   ALT 10 12/06/2014 0804   ALKPHOS 81 04/08/2016 1221   ALKPHOS 89 12/06/2014 0804   BILITOT 0.4 04/08/2016 1221   BILITOT 0.37 12/06/2014 0804   GFRNONAA >60 02/27/2016 0955   GFRAA >60 02/27/2016 0955   CRP 22 down to 6 today      Assessment & Plan:  75 year old female with 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  1. 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 The drainage she is having from her rectum is very likely due to now untreated colitis. She received 2 doses of IV Remicade while in the hospital but is overdue for ongoing therapy. She feels that Remicade made her "sicker" and inquires about Humira and Entyvio. Her fatigue and poor appetite are likely related to inflammation associated with ongoing colitis. Certainly the diversion with the end ileostomy has helped her abscess heel and likely also with her colitis. --Flexible sigmoidoscopy recommended to evaluate disease activity in the left colon. We discussed the risks, benefits and alternatives and she wishes to proceed --Begin prednisone 20 mg daily 2 weeks, 15 mg 2 weeks then 10 mg daily until follow-up --I recommended strongly that she reinitiate biologic therapy. She will see discuss this with surgery and also wait until the  flexible sigmoidoscopy results. She does not want Remicade. I think she would be a good candidate for Entyvio. We discussed the risks, benefits and alternatives. She is also considering Humira --CBC, CMP and CRP today --I have left her off of tinidazole due to nausea --I'm canceling her Remicade infusion which was scheduled for 04/23/2016  2. Rash left shin -- very suspicious for pyoderma gangrenosum. She has not had issue with PG before. I recommended she call Dr. Allyson Sabal her dermatologist to have this evaluated. I'm going to treat it as PG for now with clobetasol 0.05% cream twice a day. She can continue the antibiotic ointment in between, though I do not think this is infectious. We also discussed that biologic therapy also helps improve PG particular when steroids aren't effective.  3. Insomnia -- increase Ambien to 10 mg daily at bedtime. Notify me if ineffective.  4. Nausea -- continue Pepcid 40 mg twice a day. Likely relates to #  1  5. Depressed mood -- related to general medical condition and also history of depression. She is trying to wean her Prozac back to her old dose of 20 mg daily. I've told her that this is not necessary right now as she is improving. She is trying to get rid of all medication as much as possible. For now she will continue Prozac 40 mg alternating with 20 mg daily. She will discuss this more she follows up with her primary physician Dr. Etter Sjogren.  No SI/HI.  4-6 week follow me, sooner if necessary 45 minutes spent with the patient today. Greater than 50% was spent in counseling and coordination of care with the patient

## 2016-04-08 NOTE — Patient Instructions (Addendum)
You have been scheduled for a flexible sigmoidoscopy. Please follow the written instructions given to you at your visit today. If you use inhalers (even only as needed), please bring them with you on the day of your procedure.   Your physician has requested that you go to the basement for the following lab work before leaving today: CBC, CMP, CRP  We have discontinued your Remicade and future remicade infusion appointments.  Continue your prozac.  Continue Pepcid.  Increase your Ambien to 10 mg dosing every night when needed for sleep. Let us know how this works.  Contact Dr Allyson Sabal for your left shin rash. We need to rule out Pyoderma gangrenosum.  Continue your antibiotic ointment.  Start clobetazole cream 0.05% twice daily. Apply to your left shin. A prescription of this has been sent to your pharmacy.  Dr Hilarie Fredrickson has advised that you be on a prednisone taper. The taper instructions are as follows: Prednisone 20 mg daily x 14 days Prednisone 15 mg daily x 14 days Prednisone 10 mg daily until follow up   Follow up with Dr Hilarie Fredrickson on Thursday, 06/05/16 @ 9:30 am.  We have given you your last hepatitis B injection today.  If you are age 74 or older, your body mass index should be between 23-30. Your Body mass index is 21.65 kg/m. If this is out of the aforementioned range listed, please consider follow up with your Primary Care Provider.  If you are age 24 or younger, your body mass index should be between 19-25. Your Body mass index is 21.65 kg/m. If this is out of the aformentioned range listed, please consider follow up with your Primary Care Provider.

## 2016-04-09 ENCOUNTER — Other Ambulatory Visit: Payer: Self-pay | Admitting: Family Medicine

## 2016-04-09 ENCOUNTER — Telehealth: Payer: Self-pay | Admitting: Internal Medicine

## 2016-04-09 DIAGNOSIS — K5000 Crohn's disease of small intestine without complications: Secondary | ICD-10-CM | POA: Diagnosis not present

## 2016-04-09 DIAGNOSIS — Z853 Personal history of malignant neoplasm of breast: Secondary | ICD-10-CM

## 2016-04-09 DIAGNOSIS — Z85828 Personal history of other malignant neoplasm of skin: Secondary | ICD-10-CM | POA: Diagnosis not present

## 2016-04-09 DIAGNOSIS — F339 Major depressive disorder, recurrent, unspecified: Secondary | ICD-10-CM | POA: Diagnosis not present

## 2016-04-09 DIAGNOSIS — Z432 Encounter for attention to ileostomy: Secondary | ICD-10-CM | POA: Diagnosis not present

## 2016-04-09 DIAGNOSIS — E440 Moderate protein-calorie malnutrition: Secondary | ICD-10-CM | POA: Diagnosis not present

## 2016-04-09 DIAGNOSIS — K219 Gastro-esophageal reflux disease without esophagitis: Secondary | ICD-10-CM | POA: Diagnosis not present

## 2016-04-09 MED ORDER — BETAMETHASONE DIPROPIONATE 0.05 % EX CREA: TOPICAL_CREAM | Freq: Two times a day (BID) | CUTANEOUS | 0 refills | Status: DC

## 2016-04-09 NOTE — Telephone Encounter (Signed)
See if Betamethasone dipropionate 0.05% cream BID is affordable (this is more potent than fluticasone or triamcinolone) She needs to see her dermatologist also

## 2016-04-09 NOTE — Telephone Encounter (Signed)
I have spoken to patient. The clobetasole was at the pharmacy but was not covered. Per Dr Hilarie Fredrickson, the other medications suggested by insurance would not be strong enough. Patient has an appointment set up with dermatology for follow up.  I advised that Dr Hilarie Fredrickson has suggested we try to see if betamethasone is covered. Rx to be sent to pharmacy.

## 2016-04-09 NOTE — Telephone Encounter (Signed)
Rx was sent at the same time as the prednisone script. At any rate, Dr Hilarie Fredrickson, patient's insurance apparently will not cover the clobetasol. They will cover fluticasone or triamcinolone. Will either of those suffice?

## 2016-04-10 ENCOUNTER — Telehealth: Payer: Self-pay

## 2016-04-10 NOTE — Telephone Encounter (Signed)
Received call from [name] that Dr. Drue Flirt could see pt today at 1pm. Pt states she cannot go today due to another appt. Pt will keep the January appt as scheduled. [facility] notified.

## 2016-04-14 ENCOUNTER — Other Ambulatory Visit: Payer: Self-pay | Admitting: Dermatology

## 2016-04-14 DIAGNOSIS — F339 Major depressive disorder, recurrent, unspecified: Secondary | ICD-10-CM | POA: Diagnosis not present

## 2016-04-14 DIAGNOSIS — L905 Scar conditions and fibrosis of skin: Secondary | ICD-10-CM | POA: Diagnosis not present

## 2016-04-14 DIAGNOSIS — Z85828 Personal history of other malignant neoplasm of skin: Secondary | ICD-10-CM | POA: Diagnosis not present

## 2016-04-14 DIAGNOSIS — Z853 Personal history of malignant neoplasm of breast: Secondary | ICD-10-CM | POA: Diagnosis not present

## 2016-04-14 DIAGNOSIS — K219 Gastro-esophageal reflux disease without esophagitis: Secondary | ICD-10-CM | POA: Diagnosis not present

## 2016-04-14 DIAGNOSIS — K5000 Crohn's disease of small intestine without complications: Secondary | ICD-10-CM | POA: Diagnosis not present

## 2016-04-14 DIAGNOSIS — C44729 Squamous cell carcinoma of skin of left lower limb, including hip: Secondary | ICD-10-CM | POA: Diagnosis not present

## 2016-04-14 DIAGNOSIS — Z432 Encounter for attention to ileostomy: Secondary | ICD-10-CM | POA: Diagnosis not present

## 2016-04-14 DIAGNOSIS — C44629 Squamous cell carcinoma of skin of left upper limb, including shoulder: Secondary | ICD-10-CM | POA: Diagnosis not present

## 2016-04-14 DIAGNOSIS — E440 Moderate protein-calorie malnutrition: Secondary | ICD-10-CM | POA: Diagnosis not present

## 2016-04-15 DIAGNOSIS — K219 Gastro-esophageal reflux disease without esophagitis: Secondary | ICD-10-CM | POA: Diagnosis not present

## 2016-04-15 DIAGNOSIS — Z432 Encounter for attention to ileostomy: Secondary | ICD-10-CM | POA: Diagnosis not present

## 2016-04-15 DIAGNOSIS — Z853 Personal history of malignant neoplasm of breast: Secondary | ICD-10-CM | POA: Diagnosis not present

## 2016-04-15 DIAGNOSIS — F339 Major depressive disorder, recurrent, unspecified: Secondary | ICD-10-CM | POA: Diagnosis not present

## 2016-04-15 DIAGNOSIS — Z85828 Personal history of other malignant neoplasm of skin: Secondary | ICD-10-CM | POA: Diagnosis not present

## 2016-04-15 DIAGNOSIS — K5000 Crohn's disease of small intestine without complications: Secondary | ICD-10-CM | POA: Diagnosis not present

## 2016-04-15 DIAGNOSIS — E440 Moderate protein-calorie malnutrition: Secondary | ICD-10-CM | POA: Diagnosis not present

## 2016-04-16 DIAGNOSIS — Z853 Personal history of malignant neoplasm of breast: Secondary | ICD-10-CM | POA: Diagnosis not present

## 2016-04-16 DIAGNOSIS — Z85828 Personal history of other malignant neoplasm of skin: Secondary | ICD-10-CM | POA: Diagnosis not present

## 2016-04-16 DIAGNOSIS — Z432 Encounter for attention to ileostomy: Secondary | ICD-10-CM | POA: Diagnosis not present

## 2016-04-16 DIAGNOSIS — K219 Gastro-esophageal reflux disease without esophagitis: Secondary | ICD-10-CM | POA: Diagnosis not present

## 2016-04-16 DIAGNOSIS — K5000 Crohn's disease of small intestine without complications: Secondary | ICD-10-CM | POA: Diagnosis not present

## 2016-04-16 DIAGNOSIS — F339 Major depressive disorder, recurrent, unspecified: Secondary | ICD-10-CM | POA: Diagnosis not present

## 2016-04-16 DIAGNOSIS — E440 Moderate protein-calorie malnutrition: Secondary | ICD-10-CM | POA: Diagnosis not present

## 2016-04-17 ENCOUNTER — Ambulatory Visit (AMBULATORY_SURGERY_CENTER): Payer: Commercial Managed Care - HMO | Admitting: Internal Medicine

## 2016-04-17 ENCOUNTER — Encounter: Payer: Self-pay | Admitting: Internal Medicine

## 2016-04-17 VITALS — BP 121/52 | HR 72 | Temp 96.8°F | Resp 15 | Ht 66.0 in | Wt 134.0 lb

## 2016-04-17 DIAGNOSIS — K529 Noninfective gastroenteritis and colitis, unspecified: Secondary | ICD-10-CM | POA: Diagnosis not present

## 2016-04-17 DIAGNOSIS — D125 Benign neoplasm of sigmoid colon: Secondary | ICD-10-CM | POA: Diagnosis not present

## 2016-04-17 DIAGNOSIS — K51519 Left sided colitis with unspecified complications: Secondary | ICD-10-CM | POA: Diagnosis present

## 2016-04-17 DIAGNOSIS — K5150 Left sided colitis without complications: Secondary | ICD-10-CM | POA: Diagnosis not present

## 2016-04-17 DIAGNOSIS — Z1211 Encounter for screening for malignant neoplasm of colon: Secondary | ICD-10-CM | POA: Diagnosis not present

## 2016-04-17 MED ORDER — SODIUM CHLORIDE 0.9 % IV SOLN: 500.0000 mL | INTRAVENOUS | Status: DC

## 2016-04-17 MED ORDER — METRONIDAZOLE 250 MG PO TABS: 250.0000 mg | ORAL_TABLET | Freq: Three times a day (TID) | ORAL | 1 refills | Status: DC

## 2016-04-17 MED ORDER — ZOLPIDEM TARTRATE 10 MG PO TABS: 10.0000 mg | ORAL_TABLET | Freq: Every evening | ORAL | 3 refills | Status: DC | PRN

## 2016-04-17 NOTE — Patient Instructions (Signed)
Discharge instructions given. Handouts on polyps and Ulcerative Colitis given. Biopsies taken. Resume previous medications. YOU HAD AN ENDOSCOPIC PROCEDURE TODAY AT [facility]:   Refer to the procedure report that was given to you for any specific questions about what was found during the examination.  If the procedure report does not answer your questions, please call your gastroenterologist to clarify.  If you requested that your care partner not be given the details of your procedure findings, then the procedure report has been included in a sealed envelope for you to review at your convenience later.  YOU SHOULD EXPECT: Some feelings of bloating in the abdomen. Passage of more gas than usual.  Walking can help get rid of the air that was put into your GI tract during the procedure and reduce the bloating. If you had a lower endoscopy (such as a colonoscopy or flexible sigmoidoscopy) you may notice spotting of blood in your stool or on the toilet paper. If you underwent a bowel prep for your procedure, you may not have a normal bowel movement for a few days.  Please Note:  You might notice some irritation and congestion in your nose or some drainage.  This is from the oxygen used during your procedure.  There is no need for concern and it should clear up in a day or so.  SYMPTOMS TO REPORT IMMEDIATELY:   Following lower endoscopy (colonoscopy or flexible sigmoidoscopy):  Excessive amounts of blood in the stool  Significant tenderness or worsening of abdominal pains  Swelling of the abdomen that is new, acute  Fever of 100F or higher   For urgent or emergent issues, a gastroenterologist can be reached at any hour by calling [phone].   DIET:  We do recommend a small meal at first, but then you may proceed to your regular diet.  Drink plenty of fluids but you should avoid alcoholic beverages for 24 hours.  ACTIVITY:  You should plan to take it easy for the rest of  today and you should NOT DRIVE or use heavy machinery until tomorrow (because of the sedation medicines used during the test).    FOLLOW UP: Our staff will call the number listed on your records the next business day following your procedure to check on you and address any questions or concerns that you may have regarding the information given to you following your procedure. If we do not reach you, we will leave a message.  However, if you are feeling well and you are not experiencing any problems, there is no need to return our call.  We will assume that you have returned to your regular daily activities without incident.  If any biopsies were taken you will be contacted by phone or by letter within the next 1-3 weeks.  Please call us at [phone] if you have not heard about the biopsies in 3 weeks.    SIGNATURES/CONFIDENTIALITY: You and/or your care partner have signed paperwork which will be entered into your electronic medical record.  These signatures attest to the fact that that the information above on your After Visit Summary has been reviewed and is understood.  Full responsibility of the confidentiality of this discharge information lies with you and/or your care-partner.

## 2016-04-17 NOTE — Progress Notes (Signed)
Called to room to assist during endoscopic procedure.  Patient ID and intended procedure confirmed with present staff. Received instructions for my participation in the procedure from the performing physician.  

## 2016-04-17 NOTE — Progress Notes (Signed)
Patient stating she has an ileostomy, no prep was used to clean out lower color.

## 2016-04-17 NOTE — Op Note (Signed)
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 The patient's tolerance of                            previous anesthesia was also reviewed. The risks                            and benefits of the procedure and the sedation                            options and risks were discussed with the patient.                            All questions were answered, and informed consent                            was obtained. Prior Anticoagulants: The patient has                            taken no previous anticoagulant or antiplatelet                            agents. ASA Grade Assessment: III - A patient with                            severe systemic disease. After reviewing the risks  and benefits, the patient was deemed in                            satisfactory condition to undergo the procedure.                           After obtaining informed consent, the scope was                            passed under direct vision. The Model GIF-[id number]                            [id number] [id number]) scope was introduced through the anus                            and  advanced to the the descending colon. The                            flexible sigmoidoscopy was accomplished without                            difficulty. The patient tolerated the procedure                            well. The quality of the bowel preparation was                            adequate. Scope In: Scope Out: Findings:                 The perianal exam revealed bilateral fistulous                            tracts from previous abscess drainage where Penrose                            drains were previously placed. There was no active                            drainage from these locations.                           The digital rectal exam findings include mild                            narrowing in the anal canal/distal rectum. No                            masses.                           Inflammation characterized by congestion (edema),                            erythema, friability, loss of vascularity, mucus,  pseudopolyps and shallow ulcerations was found in a                            continuous and circumferential pattern from the                            rectum to the descending colon. No sites were                            spared. This was moderate in severity, and when                            compared to previous examinations, the findings are                            improved. Biopsies were taken with a cold forceps                            for histology. In the distal rectum near the anal                            canal there are 2 deep ulcerations on opposing                            walls.                           Two sessile polyps, most likely inflammatory, were                            found in the sigmoid colon. The polyps were 3 to 4                            mm in size. These polyps were removed with a cold                            biopsy forceps. Resection and retrieval were                             complete. Complications:            No immediate complications. Estimated Blood Loss:     Estimated blood loss was minimal. 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 No active fluctuance or drainage.                           - Mild anal stricturing noted on digital rectal                            exam.                           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                            Biopsied.                           - Overall colitis has improved from last                            examination in September 2017 (prior to Remicade                            during hospitalization and diverting ileostomy)                           - Two 3 to 4 mm polyps in the sigmoid colon,                            removed with a cold biopsy forceps. Resected and                            retrieved.                           - Deep ulcerations on opposing walls in the distal                            rectum at anal canal. Recommendation:           - The patient will be observed post-procedure,                            until all discharge criteria are met.                           - Continue present medications. Further discussion                            about reinitiating biologic therapy.                           - Await pathology results.                           - The findings and recommendations were discussed                            with the patient and their family. Jerene Bears, MD 04/17/2016 8:38:27 AM This report has been signed electronically. CC Letter to:             Johnathan Hausen MD

## 2016-04-17 NOTE — Progress Notes (Signed)
A and O x3. Report to RN. Tolerated MAC anesthesia well. 

## 2016-04-18 ENCOUNTER — Telehealth: Payer: Self-pay | Admitting: *Deleted

## 2016-04-18 NOTE — Telephone Encounter (Signed)
  Follow up Call-  Call back number 04/17/2016 09/25/2015 06/28/2014  Post procedure Call Back phone  # [phone]  Permission to leave phone message Yes Yes Yes  Some recent data might be hidden     Patient questions:  Do you have a fever, pain , or abdominal swelling? No. Pain Score  0 *  Have you tolerated food without any problems? Yes.    Have you been able to return to your normal activities? Yes.    Do you have any questions about your discharge instructions: Diet   No. Medications  No. Follow up visit  No.  Do you have questions or concerns about your Care? No.  Actions: * If pain score is 4 or above: No action needed, pain <4.

## 2016-04-23 ENCOUNTER — Ambulatory Visit (HOSPITAL_COMMUNITY): Payer: Commercial Managed Care - HMO

## 2016-04-25 ENCOUNTER — Ambulatory Visit
Admission: RE | Admit: 2016-04-25 | Discharge: 2016-04-25 | Disposition: A | Discharge status: Home / Self Care | Payer: Commercial Managed Care - HMO | Source: Ambulatory Visit | Attending: Family Medicine | Admitting: Family Medicine

## 2016-04-25 DIAGNOSIS — R922 Inconclusive mammogram: Secondary | ICD-10-CM | POA: Diagnosis not present

## 2016-04-25 DIAGNOSIS — Z853 Personal history of malignant neoplasm of breast: Secondary | ICD-10-CM

## 2016-04-28 HISTORY — PX: INCISION AND DRAINAGE ABSCESS: SHX5864

## 2016-04-29 ENCOUNTER — Telehealth: Payer: Self-pay | Admitting: Internal Medicine

## 2016-04-29 NOTE — Telephone Encounter (Signed)
I am absolutely fine with second opinion at [facility], please refer to Dr. Jeanne Ivan, as patient has requested

## 2016-04-29 NOTE — Telephone Encounter (Signed)
Pts sister states pt is hesitant about surgery and wants a second opinion from GI doc at [facility]. Requesting to see Dr. Emelda Fear. Please advise.

## 2016-04-29 NOTE — Telephone Encounter (Signed)
Records and referral form faxed to [facility]. [facility] will review records and then contact pt with appt. Pts sister aware.

## 2016-04-30 ENCOUNTER — Encounter: Payer: Self-pay | Admitting: Internal Medicine

## 2016-05-01 DIAGNOSIS — Z432 Encounter for attention to ileostomy: Secondary | ICD-10-CM | POA: Diagnosis not present

## 2016-05-01 DIAGNOSIS — F339 Major depressive disorder, recurrent, unspecified: Secondary | ICD-10-CM | POA: Diagnosis not present

## 2016-05-01 DIAGNOSIS — E440 Moderate protein-calorie malnutrition: Secondary | ICD-10-CM | POA: Diagnosis not present

## 2016-05-01 DIAGNOSIS — Z85828 Personal history of other malignant neoplasm of skin: Secondary | ICD-10-CM | POA: Diagnosis not present

## 2016-05-01 DIAGNOSIS — Z853 Personal history of malignant neoplasm of breast: Secondary | ICD-10-CM | POA: Diagnosis not present

## 2016-05-01 DIAGNOSIS — K5000 Crohn's disease of small intestine without complications: Secondary | ICD-10-CM | POA: Diagnosis not present

## 2016-05-01 DIAGNOSIS — K219 Gastro-esophageal reflux disease without esophagitis: Secondary | ICD-10-CM | POA: Diagnosis not present

## 2016-05-02 DIAGNOSIS — Z932 Ileostomy status: Secondary | ICD-10-CM | POA: Diagnosis not present

## 2016-05-02 DIAGNOSIS — K5090 Crohn's disease, unspecified, without complications: Secondary | ICD-10-CM | POA: Diagnosis not present

## 2016-05-27 DIAGNOSIS — Z932 Ileostomy status: Secondary | ICD-10-CM | POA: Diagnosis not present

## 2016-06-05 ENCOUNTER — Ambulatory Visit (INDEPENDENT_AMBULATORY_CARE_PROVIDER_SITE_OTHER): Payer: Medicare HMO | Admitting: Internal Medicine

## 2016-06-05 ENCOUNTER — Encounter: Payer: Self-pay | Admitting: Internal Medicine

## 2016-06-05 ENCOUNTER — Other Ambulatory Visit (INDEPENDENT_AMBULATORY_CARE_PROVIDER_SITE_OTHER): Payer: Medicare HMO

## 2016-06-05 VITALS — BP 110/66 | HR 88 | Ht 65.75 in | Wt 140.0 lb

## 2016-06-05 DIAGNOSIS — K51919 Ulcerative colitis, unspecified with unspecified complications: Secondary | ICD-10-CM | POA: Diagnosis not present

## 2016-06-05 LAB — COMPREHENSIVE METABOLIC PANEL
ALK PHOS: 64 U/L (ref 39–117)
ALT: 11 U/L (ref 0–35)
AST: 14 U/L (ref 0–37)
Albumin: 4 g/dL (ref 3.5–5.2)
BILIRUBIN TOTAL: 0.4 mg/dL (ref 0.2–1.2)
BUN: 11 mg/dL (ref 6–23)
CO2: 24 mEq/L (ref 19–32)
Calcium: 9.3 mg/dL (ref 8.4–10.5)
Chloride: 102 mEq/L (ref 96–112)
Creatinine, Ser: 0.69 mg/dL (ref 0.40–1.20)
GFR: 88.25 mL/min (ref >60.00)
GLUCOSE: 114 mg/dL — AB (ref 70–99)
Potassium: 4 mEq/L (ref 3.5–5.1)
SODIUM: 133 meq/L — AB (ref 135–145)
TOTAL PROTEIN: 7.1 g/dL (ref 6.0–8.3)

## 2016-06-05 LAB — HIGH SENSITIVITY CRP: CRP, High Sensitivity: 5.16 mg/L — ABNORMAL HIGH (ref 0.000–5.000)

## 2016-06-05 LAB — CBC WITH DIFFERENTIAL/PLATELET
BASOS ABS: 0 10*3/uL (ref 0.0–0.1)
Basophils Relative: 0.4 % (ref 0.0–3.0)
Eosinophils Absolute: 0 10*3/uL (ref 0.0–0.7)
Eosinophils Relative: 0 % (ref 0.0–5.0)
HCT: 45.2 % (ref 36.0–46.0)
Hemoglobin: 15.1 g/dL — ABNORMAL HIGH (ref 12.0–15.0)
Lymphocytes Relative: 8.1 % — ABNORMAL LOW (ref 12.0–46.0)
Lymphs Abs: 1.1 10*3/uL (ref 0.7–4.0)
MCHC: 33.5 g/dL (ref 30.0–36.0)
MCV: 89.1 fl (ref 78.0–100.0)
MONO ABS: 0.3 10*3/uL (ref 0.1–1.0)
MONOS PCT: 2.2 % — AB (ref 3.0–12.0)
Neutro Abs: 11.7 10*3/uL — ABNORMAL HIGH (ref 1.4–7.7)
Neutrophils Relative %: 89.3 % — ABNORMAL HIGH (ref 43.0–77.0)
Platelets: 296 10*3/uL (ref 150.0–400.0)
RBC: 5.07 Mil/uL (ref 3.87–5.11)
RDW: 15.9 % — ABNORMAL HIGH (ref 11.5–15.5)
WBC: 13.1 10*3/uL — ABNORMAL HIGH (ref 4.0–10.5)

## 2016-06-05 LAB — IBC PANEL
Iron: 88 ug/dL (ref 42–145)
SATURATION RATIOS: 21.4 % (ref 20.0–50.0)
Transferrin: 294 mg/dL (ref 212.0–360.0)

## 2016-06-05 LAB — FERRITIN: Ferritin: 46 ng/mL (ref 10.0–291.0)

## 2016-06-05 MED ORDER — PREDNISONE 20 MG PO TABS: 20.0000 mg | ORAL_TABLET | Freq: Every day | ORAL | 2 refills | Status: DC

## 2016-06-05 NOTE — Patient Instructions (Addendum)
Please increase your prednisone dosage to 20 mg until your return office visit with Dr Hilarie Fredrickson.  Continue your Ambien at night.  Continue Pepcid twice daily.  Follow up with Dr Hilarie Fredrickson on Tuesday, 07/29/16 @ 9 am.  Your physician has requested that you go to the basement for the following lab work before leaving today: CBC, CMP, CRP, IBC, ferritin  If you are age 75 or older, your body mass index should be between 23-30. Your Body mass index is 22.77 kg/m. If this is out of the aforementioned range listed, please consider follow up with your Primary Care Provider.  If you are age 54 or younger, your body mass index should be between 19-25. Your Body mass index is 22.77 kg/m. If this is out of the aformentioned range listed, please consider follow up with your Primary Care Provider.

## 2016-06-05 NOTE — Progress Notes (Signed)
Subjective:    Patient ID: Sara Barnes, female    DOB: 02/08/1941, 76 y.o.   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 She is here with her sister. She was last seen in the office on 04/08/2016 and came for flexible sigmoidoscopy on 04/17/2016.  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 There was mild narrowing in the anal canal without mass. There was moderate colitis in the entire examined colon along with 2 deep ulcerations in the distal rectum/anal canal. 2 inflammatory polyps were also biopsied. Pathology results showed moderately active chronic colitis with no dysplasia.  She has been taking prednisone on taper and is now down to 10 mg daily. She is followed up with Dr. Hassell Done at [facility] and canceled her visit with Dr. Drue Flirt for a second surgical opinion. She was to see Dr. Drue Flirt who specializes in colorectal IBD surgery but canceled this appointment because she states "I panicked". She states that she was worried that complete colectomy would be recommended with no hope of ostomy takedown.  She does have a second medical GI opinion scheduled with Dr. Emelda Fear at [facility] on 06/16/2016.  She overall is feeling better and stronger. She is having frequent drainage of mucus and bloody material per rectum. This makes it difficult for her to sleep. She does have some lower abdominal discomfort before rectal drainage which resolves afterward. She stopped Flagyl because of nausea. Tinidazole was previous he stopped for the same reason. She noted that the rectal drainage increased as she tapered prednisone down. She denies fever or chills. Ostomy is functioning well but she continues  to "hated".  She wishes to discuss escalation of medical therapy to include Biologics. We have had this discussion at length multiple times in the past. She understands at least for now that the ostomy cannot be reversed until her colitis is under better control. She wishes to discuss Entyvio versus Humira  Sleep has improved with Ambien. She remains off of pain medications. She is using Pepcid 40 mg twice a day.  She saw dermatology for the raised lesion on the left anterior shin. My concern was that this was pyoderma gangrenosum but she reports it was biopsied by dermatology and determined to be a skin cancer. This was treated with cryotherapy and has been improving.  Review of Systems  as per history of present illness, otherwise negative  Current Medications, Allergies, Past Medical History, Past Surgical History, Family History and Social History were reviewed in [employer] record.     Objective:   Physical Exam BP 110/66 (BP Location: Right Arm, Patient Position: Sitting, Cuff Size: Normal)   Pulse 88   Ht 5' 5.75" (1.67 m)   Wt 140 lb (63.5 kg)   BMI 22.77 kg/m  Constitutional: Well-developed and well-nourished. No distress. HEENT: Normocephalic and atraumatic. Oropharynx is clear and moist. No oropharyngeal exudate. Conjunctivae are normal.  No scleral icterus. Neck: Neck supple. Trachea midline. Cardiovascular: Normal rate, regular rhythm and intact distal pulses. No M/R/G Pulmonary/chest: Effort normal and breath sounds normal. No wheezing, rales or rhonchi. Abdominal: Soft, Ostomy in the right lower quadrant, nontender, nondistended. Bowel sounds active throughout.  Extremities: no clubbing, cyanosis, or edema Neurological: Alert and oriented  to person place and time. Skin: Skin is warm and dry. Lesion on left anterior shin is smaller with out surrounding erythema or induration Psychiatric: Normal mood and affect. Behavior is normal.  CBC      Component Value Date/Time   WBC 13.1 (H) 06/05/2016 1024   RBC 5.07 06/05/2016 1024   HGB 15.1 (H) 06/05/2016 1024   HGB 13.7 12/06/2014 0804   HCT 45.2 06/05/2016 1024   HCT 41.1 12/06/2014 0804   PLT 296.0 06/05/2016 1024   PLT 303 12/06/2014 0804   MCV 89.1 06/05/2016 1024   MCV 95.8 12/06/2014 0804   MCH 29.1 02/26/2016 0433   MCHC 33.5 06/05/2016 1024   RDW 15.9 (H) 06/05/2016 1024   RDW 12.2 12/06/2014 0804   LYMPHSABS 1.1 06/05/2016 1024   LYMPHSABS 1.8 12/06/2014 0804   MONOABS 0.3 06/05/2016 1024   MONOABS 0.8 12/06/2014 0804   EOSABS 0.0 06/05/2016 1024   EOSABS 0.2 12/06/2014 0804   BASOSABS 0.0 06/05/2016 1024   BASOSABS 0.0 12/06/2014 0804   CMP     Component Value Date/Time   NA 133 (L) 06/05/2016 1024   NA 138 12/06/2014 0804   K 4.0 06/05/2016 1024   K 4.1 12/06/2014 0804   CL 102 06/05/2016 1024   CO2 24 06/05/2016 1024   CO2 25 12/06/2014 0804   GLUCOSE 114 (H) 06/05/2016 1024   GLUCOSE 92 12/06/2014 0804   BUN 11 06/05/2016 1024   BUN 12.3 12/06/2014 0804   CREATININE 0.69 06/05/2016 1024   CREATININE 0.8 12/06/2014 0804   CALCIUM 9.3 06/05/2016 1024   CALCIUM 9.4 12/06/2014 0804   PROT 7.1 06/05/2016 1024   PROT 6.5 12/06/2014 0804   ALBUMIN 4.0 06/05/2016 1024   ALBUMIN 3.3 (L) 12/06/2014 0804   AST 14 06/05/2016 1024   AST 9 12/06/2014 0804   ALT 11 06/05/2016 1024   ALT 10 12/06/2014 0804   ALKPHOS 64 06/05/2016 1024   ALKPHOS 89 12/06/2014 0804   BILITOT 0.4 06/05/2016 1024   BILITOT 0.37 12/06/2014 0804   GFRNONAA >60 02/27/2016 0955   GFRAA >60 02/27/2016 0955       Assessment & Plan:  76 year old female with 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   1. 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 She seems to have  come to the realization that her ostomy is necessary for now until we better manage her colitis. Managing her colitis is been difficult for multiple providers throughout the years due to her reluctance in fear of medications. Her rectal drainage is secondary to active colitis not being fully treated. I'm going to increase prednisone back to 20 mg daily for now. --I'm excited that she will get to visit with Dr. Emelda Fear at [facility]. Once this appointment occurs Dr. Emelda Fear and I can discuss how to proceed. I feel that either Humira or Entyvio would be fine choices for her. We have reviewed the risks, benefits and alternatives several times in the past. The patient remains convinced that the 2 doses of Remicade she got while hospitalized in September 2017 made her "sicker". She does not want to resume Remicade --Albumin has improved as has her malnutrition. This is reassuring --She has been intolerant to metronidazole and tinidazole and recurrent perirectal abscess risk is likely low as long as her colon remains diverted --She  is coming to clear understanding regarding her colitis and the need to treat this with medications other than steroids. I will see her back in 6-8 weeks and discuss further after her consultation at [facility].  2. Left lower extremity skin cancer -- being followed and treated by dermatology. She will need close dermatologic follow-up particularly once we resume biologic therapy.  3. Insomnia -- improved with Ambien. Continue 10 mg daily at bedtime  40 minutes spent with the patient today. Greater than 50% was spent in counseling and coordination of care with the patient

## 2016-06-13 DIAGNOSIS — Z932 Ileostomy status: Secondary | ICD-10-CM | POA: Diagnosis not present

## 2016-06-13 DIAGNOSIS — K5090 Crohn's disease, unspecified, without complications: Secondary | ICD-10-CM | POA: Diagnosis not present

## 2016-06-16 DIAGNOSIS — K50113 Crohn's disease of large intestine with fistula: Secondary | ICD-10-CM | POA: Diagnosis not present

## 2016-06-16 DIAGNOSIS — K603 Anal fistula: Secondary | ICD-10-CM | POA: Diagnosis not present

## 2016-06-16 DIAGNOSIS — K610 Anal abscess: Secondary | ICD-10-CM | POA: Diagnosis not present

## 2016-06-17 ENCOUNTER — Emergency Department (HOSPITAL_COMMUNITY): Payer: Medicare HMO

## 2016-06-17 ENCOUNTER — Encounter (HOSPITAL_COMMUNITY): Payer: Self-pay

## 2016-06-17 ENCOUNTER — Inpatient Hospital Stay (HOSPITAL_COMMUNITY)
Admission: EM | Admit: 2016-06-17 | Discharge: 2016-06-24 | DRG: 389 | Disposition: A | Discharge status: Home / Self Care | Payer: Medicare HMO | Attending: Family Medicine | Admitting: Family Medicine

## 2016-06-17 DIAGNOSIS — R140 Abdominal distension (gaseous): Secondary | ICD-10-CM | POA: Diagnosis not present

## 2016-06-17 DIAGNOSIS — Z932 Ileostomy status: Secondary | ICD-10-CM

## 2016-06-17 DIAGNOSIS — Z853 Personal history of malignant neoplasm of breast: Secondary | ICD-10-CM | POA: Diagnosis not present

## 2016-06-17 DIAGNOSIS — R109 Unspecified abdominal pain: Secondary | ICD-10-CM | POA: Diagnosis not present

## 2016-06-17 DIAGNOSIS — R112 Nausea with vomiting, unspecified: Secondary | ICD-10-CM

## 2016-06-17 DIAGNOSIS — F419 Anxiety disorder, unspecified: Secondary | ICD-10-CM | POA: Diagnosis not present

## 2016-06-17 DIAGNOSIS — K51011 Ulcerative (chronic) pancolitis with rectal bleeding: Secondary | ICD-10-CM | POA: Diagnosis not present

## 2016-06-17 DIAGNOSIS — K5190 Ulcerative colitis, unspecified, without complications: Secondary | ICD-10-CM | POA: Diagnosis not present

## 2016-06-17 DIAGNOSIS — K51014 Ulcerative (chronic) pancolitis with abscess: Secondary | ICD-10-CM

## 2016-06-17 DIAGNOSIS — K56609 Unspecified intestinal obstruction, unspecified as to partial versus complete obstruction: Secondary | ICD-10-CM | POA: Diagnosis not present

## 2016-06-17 DIAGNOSIS — Z0189 Encounter for other specified special examinations: Secondary | ICD-10-CM

## 2016-06-17 DIAGNOSIS — E875 Hyperkalemia: Secondary | ICD-10-CM | POA: Diagnosis not present

## 2016-06-17 DIAGNOSIS — R1010 Upper abdominal pain, unspecified: Secondary | ICD-10-CM | POA: Diagnosis not present

## 2016-06-17 DIAGNOSIS — K603 Anal fistula: Secondary | ICD-10-CM

## 2016-06-17 DIAGNOSIS — G4700 Insomnia, unspecified: Secondary | ICD-10-CM | POA: Diagnosis present

## 2016-06-17 DIAGNOSIS — Z85828 Personal history of other malignant neoplasm of skin: Secondary | ICD-10-CM

## 2016-06-17 DIAGNOSIS — K668 Other specified disorders of peritoneum: Secondary | ICD-10-CM

## 2016-06-17 DIAGNOSIS — F329 Major depressive disorder, single episode, unspecified: Secondary | ICD-10-CM | POA: Diagnosis not present

## 2016-06-17 DIAGNOSIS — R0789 Other chest pain: Secondary | ICD-10-CM | POA: Diagnosis not present

## 2016-06-17 DIAGNOSIS — E860 Dehydration: Secondary | ICD-10-CM | POA: Diagnosis not present

## 2016-06-17 DIAGNOSIS — I5032 Chronic diastolic (congestive) heart failure: Secondary | ICD-10-CM | POA: Diagnosis present

## 2016-06-17 DIAGNOSIS — D72829 Elevated white blood cell count, unspecified: Secondary | ICD-10-CM | POA: Diagnosis present

## 2016-06-17 DIAGNOSIS — R935 Abnormal findings on diagnostic imaging of other abdominal regions, including retroperitoneum: Secondary | ICD-10-CM

## 2016-06-17 DIAGNOSIS — K598 Other specified functional intestinal disorders: Secondary | ICD-10-CM | POA: Diagnosis not present

## 2016-06-17 DIAGNOSIS — I4891 Unspecified atrial fibrillation: Secondary | ICD-10-CM | POA: Diagnosis not present

## 2016-06-17 DIAGNOSIS — R1084 Generalized abdominal pain: Secondary | ICD-10-CM | POA: Diagnosis not present

## 2016-06-17 DIAGNOSIS — I480 Paroxysmal atrial fibrillation: Secondary | ICD-10-CM | POA: Diagnosis not present

## 2016-06-17 DIAGNOSIS — R079 Chest pain, unspecified: Secondary | ICD-10-CM | POA: Diagnosis not present

## 2016-06-17 DIAGNOSIS — Z7952 Long term (current) use of systemic steroids: Secondary | ICD-10-CM | POA: Diagnosis not present

## 2016-06-17 DIAGNOSIS — Z79899 Other long term (current) drug therapy: Secondary | ICD-10-CM

## 2016-06-17 DIAGNOSIS — Z4682 Encounter for fitting and adjustment of non-vascular catheter: Secondary | ICD-10-CM | POA: Diagnosis not present

## 2016-06-17 DIAGNOSIS — D72823 Leukemoid reaction: Secondary | ICD-10-CM | POA: Diagnosis not present

## 2016-06-17 DIAGNOSIS — K5669 Other partial intestinal obstruction: Secondary | ICD-10-CM | POA: Diagnosis not present

## 2016-06-17 DIAGNOSIS — F411 Generalized anxiety disorder: Secondary | ICD-10-CM | POA: Diagnosis not present

## 2016-06-17 LAB — URINALYSIS, ROUTINE W REFLEX MICROSCOPIC
BILIRUBIN URINE: NEGATIVE
Glucose, UA: NEGATIVE mg/dL
Ketones, ur: NEGATIVE mg/dL
LEUKOCYTES UA: NEGATIVE
Nitrite: NEGATIVE
PROTEIN: NEGATIVE mg/dL
Specific Gravity, Urine: 1.016 (ref 1.005–1.030)
pH: 5 (ref 5.0–8.0)

## 2016-06-17 LAB — CBC
HEMATOCRIT: 48.1 % — AB (ref 36.0–46.0)
Hemoglobin: 17.2 g/dL — ABNORMAL HIGH (ref 12.0–15.0)
MCH: 31.2 pg (ref 26.0–34.0)
MCHC: 35.8 g/dL (ref 30.0–36.0)
MCV: 87.3 fL (ref 78.0–100.0)
Platelets: 312 10*3/uL (ref 150–400)
RBC: 5.51 MIL/uL — AB (ref 3.87–5.11)
RDW: 15.1 % (ref 11.5–15.5)
WBC: 25.1 10*3/uL — AB (ref 4.0–10.5)

## 2016-06-17 LAB — COMPREHENSIVE METABOLIC PANEL
ALT: 14 U/L (ref 14–54)
AST: 16 U/L (ref 15–41)
Albumin: 4.1 g/dL (ref 3.5–5.0)
Alkaline Phosphatase: 76 U/L (ref 38–126)
Anion gap: 10 (ref 5–15)
BUN: 13 mg/dL (ref 6–20)
CHLORIDE: 109 mmol/L (ref 101–111)
CO2: 16 mmol/L — ABNORMAL LOW (ref 22–32)
Calcium: 9.6 mg/dL (ref 8.9–10.3)
Creatinine, Ser: 0.74 mg/dL (ref 0.44–1.00)
GFR calc Af Amer: >60 mL/min (ref >60)
Glucose, Bld: 122 mg/dL — ABNORMAL HIGH (ref 65–99)
POTASSIUM: 3.9 mmol/L (ref 3.5–5.1)
Sodium: 135 mmol/L (ref 135–145)
Total Bilirubin: 0.6 mg/dL (ref 0.3–1.2)
Total Protein: 7.6 g/dL (ref 6.5–8.1)

## 2016-06-17 LAB — LIPASE, BLOOD: LIPASE: 21 U/L (ref 11–51)

## 2016-06-17 MED ORDER — HYDROMORPHONE HCL 2 MG/ML IJ SOLN
0.5000 mg | INTRAMUSCULAR | Status: DC | PRN
Start: 2016-06-17 — End: 2016-06-19
  Administered 2016-06-17 – 2016-06-19 (×8): 0.5 mg via INTRAVENOUS
  Filled 2016-06-17 (×9): qty 1

## 2016-06-17 MED ORDER — POTASSIUM CHLORIDE IN NACL 20-0.9 MEQ/L-% IV SOLN
INTRAVENOUS | Status: DC
  Administered 2016-06-17 – 2016-06-19 (×4): via INTRAVENOUS
  Filled 2016-06-17 (×6): qty 1000

## 2016-06-17 MED ORDER — CHLORHEXIDINE GLUCONATE 0.12 % MT SOLN
15.0000 mL | Freq: Two times a day (BID) | OROMUCOSAL | Status: DC
  Administered 2016-06-18 – 2016-06-22 (×4): 15 mL via OROMUCOSAL
  Filled 2016-06-17 (×11): qty 15

## 2016-06-17 MED ORDER — ONDANSETRON HCL 4 MG/2ML IJ SOLN
4.0000 mg | Freq: Once | INTRAMUSCULAR | Status: AC
  Administered 2016-06-17: 4 mg via INTRAVENOUS
  Filled 2016-06-17: qty 2

## 2016-06-17 MED ORDER — ZOLPIDEM TARTRATE 5 MG PO TABS
5.0000 mg | ORAL_TABLET | Freq: Every evening | ORAL | Status: DC | PRN
Start: 2016-06-17 — End: 2016-06-24
  Administered 2016-06-17 – 2016-06-23 (×5): 5 mg via ORAL
  Filled 2016-06-17 (×6): qty 1

## 2016-06-17 MED ORDER — SODIUM CHLORIDE 0.9 % IJ SOLN
INTRAMUSCULAR | Status: AC
  Filled 2016-06-17: qty 50

## 2016-06-17 MED ORDER — IOPAMIDOL (ISOVUE-300) INJECTION 61%
30.0000 mL | Freq: Once | INTRAVENOUS | Status: AC | PRN
  Administered 2016-06-17: 30 mL via ORAL

## 2016-06-17 MED ORDER — IOPAMIDOL (ISOVUE-300) INJECTION 61%
100.0000 mL | Freq: Once | INTRAVENOUS | Status: AC | PRN
  Administered 2016-06-17: 100 mL via INTRAVENOUS

## 2016-06-17 MED ORDER — ONDANSETRON HCL 4 MG/2ML IJ SOLN
4.0000 mg | Freq: Four times a day (QID) | INTRAMUSCULAR | Status: DC | PRN
  Administered 2016-06-18: 4 mg via INTRAVENOUS
  Filled 2016-06-17: qty 2

## 2016-06-17 MED ORDER — IOPAMIDOL (ISOVUE-300) INJECTION 61%
INTRAVENOUS | Status: AC
  Administered 2016-06-17: 100 mL via INTRAVENOUS
  Filled 2016-06-17: qty 100

## 2016-06-17 MED ORDER — ONDANSETRON HCL 4 MG PO TABS: 4.0000 mg | ORAL_TABLET | Freq: Four times a day (QID) | ORAL | Status: DC | PRN

## 2016-06-17 MED ORDER — SODIUM CHLORIDE 0.9 % IV SOLN: INTRAVENOUS | Status: DC

## 2016-06-17 MED ORDER — FAMOTIDINE IN NACL 20-0.9 MG/50ML-% IV SOLN
20.0000 mg | Freq: Once | INTRAVENOUS | Status: AC
  Administered 2016-06-17: 20 mg via INTRAVENOUS
  Filled 2016-06-17: qty 50

## 2016-06-17 MED ORDER — METHYLPREDNISOLONE SODIUM SUCC 40 MG IJ SOLR
40.0000 mg | Freq: Every day | INTRAMUSCULAR | Status: DC
  Administered 2016-06-17 – 2016-06-18 (×2): 40 mg via INTRAVENOUS
  Filled 2016-06-17 (×2): qty 1

## 2016-06-17 MED ORDER — SODIUM CHLORIDE 0.9 % IV BOLUS (SEPSIS)
1000.0000 mL | Freq: Once | INTRAVENOUS | Status: AC
  Administered 2016-06-17: 1000 mL via INTRAVENOUS

## 2016-06-17 MED ORDER — FENTANYL CITRATE (PF) 100 MCG/2ML IJ SOLN
12.5000 ug | Freq: Once | INTRAMUSCULAR | Status: AC
  Administered 2016-06-17: 12.5 ug via INTRAVENOUS
  Filled 2016-06-17: qty 2

## 2016-06-17 MED ORDER — ACETAMINOPHEN 325 MG PO TABS
650.0000 mg | ORAL_TABLET | Freq: Four times a day (QID) | ORAL | Status: DC | PRN
  Filled 2016-06-17: qty 2

## 2016-06-17 MED ORDER — ENOXAPARIN SODIUM 40 MG/0.4ML ~~LOC~~ SOLN
40.0000 mg | SUBCUTANEOUS | Status: DC
  Administered 2016-06-17 – 2016-06-24 (×8): 40 mg via SUBCUTANEOUS
  Filled 2016-06-17 (×9): qty 0.4

## 2016-06-17 MED ORDER — ACETAMINOPHEN 650 MG RE SUPP
650.0000 mg | Freq: Four times a day (QID) | RECTAL | Status: DC | PRN
  Administered 2016-06-18: 650 mg via RECTAL
  Filled 2016-06-17: qty 1

## 2016-06-17 MED ORDER — IOPAMIDOL (ISOVUE-300) INJECTION 61%
INTRAVENOUS | Status: AC
  Administered 2016-06-17: 30 mL via ORAL
  Filled 2016-06-17: qty 30

## 2016-06-17 MED ORDER — FLUOXETINE HCL 20 MG PO CAPS
20.0000 mg | ORAL_CAPSULE | Freq: Every day | ORAL | Status: DC
  Administered 2016-06-17 – 2016-06-24 (×8): 20 mg via ORAL
  Filled 2016-06-17 (×8): qty 1

## 2016-06-17 MED ORDER — FAMOTIDINE IN NACL 20-0.9 MG/50ML-% IV SOLN
20.0000 mg | Freq: Two times a day (BID) | INTRAVENOUS | Status: DC
Start: 2016-06-17 — End: 2016-06-23
  Administered 2016-06-17 – 2016-06-23 (×13): 20 mg via INTRAVENOUS
  Filled 2016-06-17 (×15): qty 50

## 2016-06-17 MED ORDER — FENTANYL CITRATE (PF) 100 MCG/2ML IJ SOLN
25.0000 ug | Freq: Once | INTRAMUSCULAR | Status: AC
Start: 2016-06-17 — End: 2016-06-17
  Administered 2016-06-17: 25 ug via INTRAVENOUS
  Filled 2016-06-17: qty 2

## 2016-06-17 MED ORDER — ORAL CARE MOUTH RINSE
15.0000 mL | Freq: Two times a day (BID) | OROMUCOSAL | Status: DC
  Administered 2016-06-18 – 2016-06-22 (×2): 15 mL via OROMUCOSAL

## 2016-06-17 NOTE — H&P (Signed)
History and Physical  Sara Barnes TLX:[id number] DOB: Jun 11, 1940 DOA: 06/17/2016   PCP: Ann Held, DO   Patient coming from: Home  Chief Complaint:   HPI:  Sara Barnes is a 76 y.o. female with medical history of  indeterminate colitis (Crohn's disease versus ulcerative colitis with no history of small bowel involvement), history of perirectal abscess now status post end ileostomy, skin cancer presenting with one-day history of abdominal pain and associated nausea and vomiting. The patient states that she had innumerable episodes of vomiting to the point of dry heaving on the day prior to admission. The patient had subjective chills, but denies any headache, sore throat, coughing, chest congestion, chest pain, coughing, hemoptysis, shortness of breath. She does complain of some dysuria without hematuria. She states that she has continued to have some rectal bleeding and continues to pass more mucus per rectum in the past week. The patient has a competent to GI history and has been followed by Dr. Hilarie Fredrickson whom she saw on 06/05/2016 at which time, the patient's prednisone was increased back to 20 mg daily from 10 mg. The patient saw Dr. Emelda Fear at [facility] on 06/16/16 and has been referred to a colorectal surgeon, Dr. Sheryn Bison.  CT of the abdomen and pelvis in the ED showed diffuse thickening of the colon which was thought to be due to underdistention, but showed mild dilatation of the small bowel extending into the ileostomy consistent with low-grade suction. There was also diffuse mesenteric edema and small amount of ascites. The patient refused NG tube. Gen. surgery and  [facility] were consulted. The patient was given fentanyl for pain and given a bolus of normal saline.  Regarding her GI history the patient had a most recent flexible sigmoidoscopy on 04/16/2016 which showed fistulous tracks from previous abscess drainage where [place] drains were placed, but did not show any  active drainage at the time of examination. There was moderate colitis in the entire examined colon along with 2 deep ulcerations in the distal rectum/anal canal. 2 inflammatory polyps were also biopsied. Pathology results showed moderately active chronic colitis with no dysplasia.  The patient was previously on metronidazole and tinidazole,but stopped them due to nausea.  She saw dermatology for the raised lesion on the left anterior shin. It was biopsied by dermatology and determined to be a skin cancer. This was treated with cryotherapy and has been improving.  She was hospitalized 01/07/16 through 02/29/16.  She underwent irrigation debridement perirectal abscess on 01/22/16, by Dr. Harlow Asa.  On 02/11/16 she was taken back to the operating room with Crohn's colitis and underwent laparoscopic diverting ileostomy by Dr. Johnathan Hausen. Her steroids were slowly tapered, diet was increased with return of bowel function. This was slow, she was placed on TPN, for nutritional support during her stay.    Assessment/Plan: Inflammatory Bowel Disease Flare -This is manifested by her small bowel obstruction as well as her increased rectal mucus changes with rectal bleeding -Gen. surgery has been consulted -[facility] consulted -until she is seen by GI, switch prednisone to solumedrol to 40 mg IV daily -npo -IVF -IV hydromorphone prn pain  Dehydration -The patient is hemoconcentrated -Continue IV fluids  Leukocytosis -Likely stress demargination in conjunction with her prednisone -Urinalysis 6-30 WBC-->add urine culture -blood cultures x 2 sets  Anxiety -Continue fluoxetine when able to tolerate po  [place] -continue [place]       Past Medical History:  Diagnosis Date  .  Allergy   . Anemia   . Basal cell carcinoma   . Blood transfusion without reported diagnosis   . Cancer of lower-inner quadrant of female breast ([place]) 11/30/2014   left  . Cancer of lower-inner quadrant of left female  breast ([place]) 11/30/2014  . Complication of anesthesia    anxiety post-op after ear surgeries  . Crohn's disease of large intestine with abscess ([place])   . Dental crowns present    recent root canal 11/2014  . Depression   . Family history of adverse reaction to anesthesia    pt's sister has hx. of post-op N/V  . Indigestion   . PONV (postoperative nausea and vomiting)   . Runny nose 12/07/2014   clear drainage, per pt.  . Seasonal allergies   . Ulcerative colitis   . Vitamin B12 deficiency    Past Surgical History:  Procedure Laterality Date  . ABDOMINAL HYSTERECTOMY     partial  . APPENDECTOMY    . BASAL CELL CARCINOMA EXCISION Left    nose  . BREAST ENHANCEMENT SURGERY    . BREAST LUMPECTOMY Left    age 5's  . BREAST LUMPECTOMY WITH RADIOACTIVE SEED AND SENTINEL LYMPH NODE BIOPSY Left 12/13/2014   Procedure: LEFT BREAST LUMPECTOMY WITH RADIOACTIVE SEED AND SENTINEL LYMPH NODE BIOPSY;  Surgeon: [name], MD;  Location: [facility];  Service: General;  Laterality: Left;  . COLONOSCOPY WITH PROPOFOL  05/28/2011; 06/28/2014  . FLEXIBLE SIGMOIDOSCOPY N/A 01/08/2016   Procedure: FLEXIBLE SIGMOIDOSCOPY;  Surgeon: [name], MD;  Location: [facility];  Service: Gastroenterology;  Laterality: N/A;  needs MAC due to pain and anxiety  . INCISION AND DRAINAGE PERIRECTAL ABSCESS N/A 09/28/2013   Procedure: IRRIGATION AND DEBRIDEMENT PERIANAL ABSCESS, proctoscopy;  Surgeon: Shann Medal, MD;  Location: [facility];  Service: General;  Laterality: N/A;  . INCISION AND DRAINAGE PERIRECTAL ABSCESS N/A 01/22/2016   Procedure: IRRIGATION AND DEBRIDEMENT PERIRECTAL ABSCESS;  Surgeon: Armandina Gemma, MD;  Location: [facility];  Service: General;  Laterality: N/A;  . INNER EAR SURGERY Bilateral   . LAPAROSCOPIC DIVERTED COLOSTOMY N/A 02/11/2016   Procedure: LAPAROSCOPIC DIVERTED ILEOSTOMY;  Surgeon: Johnathan Hausen, MD;  Location: [facility];  Service: General;  Laterality: N/A;   Social  History:  reports that she has never smoked. She has never used smokeless tobacco. She reports that she drinks alcohol. She reports that she does not use drugs.   Family History  Problem Relation Age of Onset  . Prostate cancer Brother 21  . Kidney disease Brother   . Diabetes Brother   . Brain cancer Father   . Rheum arthritis Mother   . Anesthesia problems Sister     post-op N/V  . Emphysema Maternal Grandfather   . Asthma Sister   . Rheum arthritis Brother   . Colon cancer Neg Hx   . Esophageal cancer Neg Hx   . Breast cancer Neg Hx      Allergies  Allergen Reactions  . Codeine Nausea And Vomiting  . Mesalamine Nausea And Vomiting  . Other Itching    PERFUMED LOTIONS  Cant have MRI due to ear implant   . Oxycodone-Acetaminophen Rash    Itching & rash Tolerates Dilaudid  . Sulfa Antibiotics Rash     Prior to Admission medications   Medication Sig Start Date End Date Taking? Authorizing Provider  Cyanocobalamin (VITAMIN B-12 IJ) Inject 1 mL as directed every 30 (thirty) days.   Yes Historical Provider, MD  famotidine (PEPCID) 40 MG tablet Take 1 tablet (40 mg total) by mouth 2 (two) times daily. Patient taking differently: Take 40 mg by mouth daily as needed for heartburn or indigestion.  02/29/16  Yes Jerrye Beavers, PA-C  FLUoxetine (PROZAC) 20 MG capsule Take 1 capsule (20 mg total) by mouth 2 (two) times daily. 03/31/16  Yes Jerene Bears, MD  Multiple Vitamins-Minerals (MULTIVITAMIN ADULTS 50+ PO) Take 0.5 tablets by mouth daily.   Yes Historical Provider, MD  predniSONE (DELTASONE) 20 MG tablet Take 1 tablet (20 mg total) by mouth daily with breakfast. 06/05/16  Yes Jerene Bears, MD  zolpidem (AMBIEN) 10 MG tablet Take 1 tablet (10 mg total) by mouth at bedtime as needed for sleep. 04/17/16 06/17/16 Yes Jerene Bears, MD  betamethasone dipropionate (DIPROLENE) 0.05 % cream Apply topically 2 (two) times daily. Pharmacy-please d/c rx for clobetasol Patient not taking:  Reported on 06/17/2016 04/09/16   Jerene Bears, MD    Review of Systems:  Constitutional:  No weight loss, night sweats Head&Eyes: No headache.  No vision loss.  No eye pain or scotoma ENT:  No Difficulty swallowing,Tooth/dental problems,Sore throat,  No ear ache, post nasal drip,  Cardio-vascular:  No chest pain, Orthopnea, PND, swelling in lower extremities,  dizziness, palpitations  GI:  No   diarrhea, loss of appetite, hematochezia, melena, heartburn, indigestion, Resp:  No shortness of breath with exertion or at rest. No cough. No coughing up of blood .No wheezing.No chest wall deformity  Skin:  no rash or lesions.  GU:  no dysuria, change in color of urine, no urgency or frequency. No flank pain.  Musculoskeletal:  No joint pain or swelling. No decreased range of motion. No back pain.  Psych:  No change in mood or affect.  Neurologic: No headache, no dysesthesia, no focal weakness, no vision loss. No syncope  Physical Exam: Vitals:   06/17/16 0153 06/17/16 0907  BP: 137/93 126/81  Pulse: 79 84  Resp: 20 18  Temp: 97.6 F (36.4 C)   TempSrc: Oral   SpO2: 99% 98%   General:  A&O x 3, NAD, nontoxic, pleasant/cooperative Head/Eye: No conjunctival hemorrhage, no icterus, [place]/AT, No nystagmus ENT:  No icterus,  No thrush, good dentition, no pharyngeal exudate Neck:  No masses, no lymphadenpathy, no bruits CV:  RRR, no rub, no gallop, no S3 Lung:  CTAB, good air movement, no wheeze, no rhonchi Abdomen: soft/NT, +BS, nondistended, no peritoneal signs Ext: No cyanosis, No rashes, No petechiae, No lymphangitis, No edema Neuro: CNII-XII intact, strength 4/5 in bilateral upper and lower extremities, no dysmetria  Labs on Admission:  Basic Metabolic Panel:  Recent Labs Lab 06/17/16 0158  NA 135  K 3.9  CL 109  CO2 16*  GLUCOSE 122*  BUN 13  CREATININE 0.74  CALCIUM 9.6   Liver Function Tests:  Recent Labs Lab 06/17/16 0158  AST 16  ALT 14  ALKPHOS 76    BILITOT 0.6  PROT 7.6  ALBUMIN 4.1    Recent Labs Lab 06/17/16 0158  LIPASE 21   No results for input(s): AMMONIA in the last 168 hours. CBC:  Recent Labs Lab 06/17/16 0158  WBC 25.1*  HGB 17.2*  HCT 48.1*  MCV 87.3  PLT 312   Coagulation Profile: No results for input(s): INR, PROTIME in the last 168 hours. Cardiac Enzymes: No results for input(s): CKTOTAL, CKMB, CKMBINDEX, TROPONINI in the last 168 hours. BNP: Invalid input(s): POCBNP CBG: No results  for input(s): GLUCAP in the last 168 hours. Urine analysis:    Component Value Date/Time   COLORURINE YELLOW 06/17/2016 0615   APPEARANCEUR CLEAR 06/17/2016 0615   LABSPEC 1.016 06/17/2016 0615   PHURINE 5.0 06/17/2016 0615   GLUCOSEU NEGATIVE 06/17/2016 0615   HGBUR SMALL (A) 06/17/2016 0615   HGBUR small 12/13/2009 0904   BILIRUBINUR NEGATIVE 06/17/2016 0615   BILIRUBINUR ne 01/30/2015 1543   KETONESUR NEGATIVE 06/17/2016 0615   PROTEINUR NEGATIVE 06/17/2016 0615   UROBILINOGEN 0.2 01/30/2015 1543   UROBILINOGEN 0.2 09/25/2013 1350   NITRITE NEGATIVE 06/17/2016 0615   LEUKOCYTESUR NEGATIVE 06/17/2016 0615   Sepsis Labs: @LABRCNTIP (procalcitonin:4,lacticidven:4) )No results found for this or any previous visit (from the past 240 hour(s)).   Radiological Exams on Admission: Ct Abdomen Pelvis W Contrast  Result Date: 06/17/2016 CLINICAL DATA:  76 year old female with lower abdominal pain. 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 EXAM: CT ABDOMEN AND PELVIS WITH CONTRAST TECHNIQUE: Multidetector CT imaging of the abdomen and pelvis was performed using the standard protocol following bolus administration of intravenous contrast. CONTRAST:  100 cc Isovue-300 COMPARISON:  Abdominal radiograph dated 02/23/2016 and CT dated 01/21/2016 FINDINGS: Lower chest: Minimal bibasilar dependent atelectatic changes of the lungs. The visualized lung bases are otherwise clear. No  intra-abdominal free air. Diffuse mesenteric edema and small free fluid. Hepatobiliary: No focal liver abnormality is seen. No gallstones, gallbladder wall thickening, or biliary dilatation. Pancreas: Unremarkable. No pancreatic ductal dilatation or surrounding inflammatory changes. Spleen: Normal in size without focal abnormality. Adrenals/Urinary Tract: Adrenal glands are unremarkable. Kidneys are normal, without renal calculi, focal lesion, or hydronephrosis. Bladder is unremarkable. Stomach/Bowel: There is postsurgical changes of resection of the terminal ileum with the right lower quadrant diverting ileostomy. There is mild diffuse prominence of fluid-filled loops of mid to distal small bowel extending to the level of the ileostomy compatible with low grade or early obstruction. The transition zone is at the ileostomy likely related to underlying adhesion or stricture. The colon is collapsed. Diffuse thickened appearance of the colon likely related to underdistention. Vascular/Lymphatic: There is mild aortoiliac atherosclerotic disease. The origins of the celiac axis, SMA, IMA as well as the origins of the renal arteries are patent. Nonocclusive scar noted at the origin of the SMA. The IVC is unremarkable. The SMV, splenic vein, and main portal vein are patent. No portal venous gas identified. There is no adenopathy. Reproductive: Hysterectomy. Other: Bilateral breast implants. Musculoskeletal: Degenerative changes of the spine. No acute fracture. IMPRESSION: 1. Postsurgical changes of bowel resection at the level of the terminal with a right lower quadrant ileostomy. There is mild dilatation of the small bowel extending to the ileostomy compatible with an early or low grade obstruction likely related to adhesion or stricture at the ileostomy. Clinical correlation and follow-up recommended. 2. Diffusely thickened colon likely related to underdistention. 3. Diffuse mesenteric edema and small ascites.  Electronically Signed   By: Anner Crete M.D.   On: 06/17/2016 07:17        Time spent: 70 minutes Code Status:   FULL Family Communication:  No Family at bedside Disposition Plan: expect 3-4 day hospitalization Consults called: [facility], General Surgery DVT Prophylaxis:  SCDs  Myangel Summons, DO  [facility] Pager [phone]  If 7PM-7AM, please contact night-coverage [email or web address] Password [facility] 06/17/2016, 9:31 AM

## 2016-06-17 NOTE — ED Provider Notes (Signed)
[facility] DEPT Provider Note   CSN: [id number] Arrival date & time: 06/17/16  0127    History   Chief Complaint Chief Complaint  Patient presents with  . Abdominal Pain  . Emesis    HPI Sara Barnes is a 76 y.o. female.  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 Patient states that symptoms began yesterday afternoon and began to worsen around dinner time. She states that she had difficulty eating dinner and experienced onset of nausea and vomiting shortly after. She reports too numerous to count episodes of emesis. Pain has been constant and waxing and waning in severity. She describes a pressure-like pain which she initially attributed to gas. She has continued to have normal stool output from her ileostomy. She has mucus-like drainage from her rectum at baseline with intermittent hematochezia. She denies any new changes or worsening of this. She was recently increased from 10 mg prednisone QD to 20 mg prednisone QD by her gastroenterologist to try and relieve these symptoms. She had her first follow-up with a gastroenterologist at [facility] yesterday. She has plans to see a colorectal surgeon in the future. No medications taken PTA other than what she is prescribed for daily use.  PCP - Dr. Etter Sjogren GI - Dr. Hilarie Fredrickson CCS - Dr. Hassell Done   The history is provided by the patient and a relative. No language interpreter was used.  Abdominal Pain   Associated symptoms include vomiting.  Emesis   Associated symptoms include abdominal pain.    Past Medical History:  Diagnosis Date  . Allergy   . Anemia   . Basal cell carcinoma   . Blood transfusion without reported diagnosis   . Cancer of lower-inner quadrant of female breast ([place]) 11/30/2014   left  . Cancer of lower-inner quadrant of left female  breast ([place]) 11/30/2014  . Complication of anesthesia    anxiety post-op after ear surgeries  . Crohn's disease of large intestine with abscess ([place])   . Dental crowns present    recent root canal 11/2014  . Depression   . Family history of adverse reaction to anesthesia    pt's sister has hx. of post-op N/V  . Indigestion   . PONV (postoperative nausea and vomiting)   . Runny nose 12/07/2014   clear drainage, per pt.  . Seasonal allergies   . Ulcerative colitis   . Vitamin B12 deficiency     Patient Active Problem List   Diagnosis Date Noted  . Small bowel obstruction   . Ileus ([place])   . Generalized anxiety disorder 02/14/2016  . 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 Hypokalemia   . Fever   . Other depression due to general medical condition   . Difficulty in walking, not elsewhere classified   . Acute blood loss anemia   . Absolute anemia   . Left upper quadrant pain   . Abdominal pain 01/07/2016  . GI bleed 01/07/2016  . IBS (irritable bowel syndrome)   . Tenesmus   . Ulcerative pancolitis with rectal bleeding ([place])   . Diarrhea 12/24/2015  . Crohn disease ([place]) 12/24/2015  . 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 Rectal pain   . Leucocytosis   . Cancer of lower-inner quadrant of left female breast ([place]) 11/30/2014  . Perirectal abscess 09/29/2013  . Depression 08/12/2006    Past Surgical History:  Procedure Laterality Date  . ABDOMINAL HYSTERECTOMY     partial  . APPENDECTOMY    . BASAL CELL CARCINOMA EXCISION Left    nose  . BREAST ENHANCEMENT SURGERY    . BREAST LUMPECTOMY Left    age 70's  . BREAST LUMPECTOMY WITH RADIOACTIVE SEED AND SENTINEL LYMPH NODE BIOPSY Left 12/13/2014   Procedure: LEFT BREAST LUMPECTOMY WITH RADIOACTIVE SEED AND SENTINEL LYMPH NODE BIOPSY;  Surgeon: [name], MD;  Location: [facility];  Service: General;  Laterality: Left;  . COLONOSCOPY WITH PROPOFOL  05/28/2011; 06/28/2014  . FLEXIBLE SIGMOIDOSCOPY N/A  01/08/2016   Procedure: FLEXIBLE SIGMOIDOSCOPY;  Surgeon: [name], MD;  Location: [facility];  Service: Gastroenterology;  Laterality: N/A;  needs MAC due to pain and anxiety  . INCISION AND DRAINAGE PERIRECTAL ABSCESS N/A 09/28/2013   Procedure: IRRIGATION AND DEBRIDEMENT PERIANAL ABSCESS, proctoscopy;  Surgeon: Shann Medal, MD;  Location: [facility];  Service: General;  Laterality: N/A;  . INCISION AND DRAINAGE PERIRECTAL ABSCESS N/A 01/22/2016   Procedure: IRRIGATION AND DEBRIDEMENT PERIRECTAL ABSCESS;  Surgeon: Armandina Gemma, MD;  Location: [facility];  Service: General;  Laterality: N/A;  . INNER EAR SURGERY Bilateral   . LAPAROSCOPIC DIVERTED COLOSTOMY N/A 02/11/2016   Procedure: LAPAROSCOPIC DIVERTED ILEOSTOMY;  Surgeon: Johnathan Hausen, MD;  Location: [facility];  Service: General;  Laterality: N/A;    OB History    No data available       Home Medications    Prior to Admission medications   Medication Sig Start Date End Date Taking? Authorizing Provider  Cyanocobalamin (VITAMIN B-12 IJ) Inject 1 mL as directed every 30 (thirty) days.   Yes Historical Provider, MD  famotidine (PEPCID) 40 MG tablet Take 1 tablet (40 mg total) by mouth 2 (two) times daily. Patient taking differently: Take 40 mg by mouth daily as needed for heartburn or indigestion.  02/29/16  Yes Jerrye Beavers, PA-C  FLUoxetine (PROZAC) 20 MG capsule Take 1 capsule (20 mg total) by mouth 2 (two) times daily. 03/31/16  Yes Jerene Bears, MD  Multiple Vitamins-Minerals (MULTIVITAMIN ADULTS 50+ PO) Take 0.5 tablets by mouth daily.   Yes Historical Provider, MD  predniSONE (DELTASONE) 20 MG tablet Take 1 tablet (20 mg total) by mouth daily with breakfast. 06/05/16  Yes Jerene Bears, MD  zolpidem (AMBIEN) 10 MG tablet Take 1 tablet (10 mg total) by mouth at bedtime as needed for sleep. 04/17/16 06/17/16 Yes Jerene Bears, MD  betamethasone dipropionate (DIPROLENE) 0.05 % cream Apply topically 2 (two) times daily.  Pharmacy-please d/c rx for clobetasol Patient not taking: Reported on 06/17/2016 04/09/16   Jerene Bears, MD    Family History Family History  Problem Relation Age of Onset  . Prostate cancer Brother 44  . Kidney disease Brother   . Diabetes Brother   . Brain cancer Father   . Rheum arthritis Mother   . Anesthesia problems Sister     post-op N/V  . Emphysema Maternal Grandfather   . Asthma Sister   . Rheum arthritis Brother   . Colon cancer Neg Hx   . Esophageal cancer Neg Hx   . Breast cancer Neg Hx     Social History Social History  Substance Use Topics  . Smoking status: Never Smoker  . Smokeless tobacco: Never Used  . Alcohol use 0.0 oz/week     Comment: daily wine with dinner     Allergies   Codeine; Mesalamine; Other; Oxycodone-acetaminophen;  and Sulfa antibiotics   Review of Systems Review of Systems  Gastrointestinal: Positive for abdominal pain and vomiting.  Ten systems reviewed and are negative for acute change, except as noted in the HPI.    Physical Exam Updated Vital Signs BP 137/93 (BP Location: Right Arm)   Pulse 79   Temp 97.6 F (36.4 C) (Oral)   Resp 20   SpO2 99%   Physical Exam  Constitutional: She is oriented to person, place, and time. She appears well-developed and well-nourished. No distress.  Appears uncomfortable, but in NAD  HENT:  Head: Normocephalic and atraumatic.  Eyes: Conjunctivae and EOM are normal. No scleral icterus.  Neck: Normal range of motion.  Cardiovascular: Normal rate, regular rhythm and intact distal pulses.   Pulmonary/Chest: Effort normal. No respiratory distress. She has no wheezes. She has no rales.  Lungs grossly CTAB  Abdominal: She exhibits distension. She exhibits no mass. There is tenderness.    RUQ and LUQ TTP. Mild distension. No masses or rigidity. Brown stool in ileostomy bag.  Musculoskeletal: Normal range of motion.  Neurological: She is alert and oriented to person, place, and time. She  exhibits normal muscle tone. Coordination normal.  GCS 15. Patient moving all extremities.  Skin: Skin is warm and dry. No rash noted. She is not diaphoretic. No erythema. No pallor.  Psychiatric: She has a normal mood and affect. Her behavior is normal.  Nursing note and vitals reviewed.    ED Treatments / Results  Labs (all labs ordered are listed, but only abnormal results are displayed) Labs Reviewed  COMPREHENSIVE METABOLIC PANEL - Abnormal; Notable for the following:       Result Value   CO2 16 (*)    Glucose, Bld 122 (*)    All other components within normal limits  CBC - Abnormal; Notable for the following:    WBC 25.1 (*)    RBC 5.51 (*)    Hemoglobin 17.2 (*)    HCT 48.1 (*)    All other components within normal limits  URINALYSIS, ROUTINE W REFLEX MICROSCOPIC - Abnormal; Notable for the following:    Hgb urine dipstick SMALL (*)    Bacteria, UA RARE (*)    Squamous Epithelial / LPF 0-5 (*)    All other components within normal limits  LIPASE, BLOOD    EKG  EKG Interpretation None       Radiology No results found.  Procedures Procedures (including critical care time)  Medications Ordered in ED Medications  iopamidol (ISOVUE-300) 61 % injection 100 mL (not administered)  sodium chloride 0.9 % bolus 1,000 mL (0 mLs Intravenous Stopped 06/17/16 0415)  ondansetron (ZOFRAN) injection 4 mg (4 mg Intravenous Given 06/17/16 0305)  famotidine (PEPCID) IVPB 20 mg premix (0 mg Intravenous Stopped 06/17/16 0338)  fentaNYL (SUBLIMAZE) injection 12.5 mcg (12.5 mcg Intravenous Given 06/17/16 0306)  iopamidol (ISOVUE-300) 61 % injection 30 mL (30 mLs Oral Contrast Given 06/17/16 0403)  ondansetron (ZOFRAN) injection 4 mg (4 mg Intravenous Given 06/17/16 0418)  fentaNYL (SUBLIMAZE) injection 12.5 mcg (12.5 mcg Intravenous Given 06/17/16 0549)     Initial Impression / Assessment and Plan / ED Course  I have reviewed the triage vital signs and the nursing notes.  Pertinent  labs & imaging results that were available during my care of the patient were reviewed by me and considered in my medical decision making (see chart for details).  Clinical Course as of Jun 17 636  Tue Jun 17, 2016  0633 Normal stooling normally RDW: 15.1 [KL]    Clinical Course User Index [KL] Doristine Devoid, PA-C    76 year old female presents to the emergency department for evaluation of upper abdominal pain as well as nausea and vomiting which began this evening. She has a complicated GI history including undifferentiated colitis with perirectal abscesses and bilateral fistula. She currently has an ileostomy. This has brown stool. No melena or hematochezia present. Patient does have mucousy discharge from her rectum at baseline. This is bloody at times. Patient denies any changes to these symptoms. She recently had her daily prednisone dose increased by her gastroenterologist, Dr. Hilarie Fredrickson.  Question whether symptoms may be due to acute on chronic colitis. Patient is pending a CT scan to further evaluate cause of her pain. She has been fairly comfortable after receiving fentanyl. Zofran has controlled the patient's nausea fairly well. She has been hydrated with IV fluids. Given complex medical history and appearance of patient, I have a low threshold for admission; even if CT were to show no emergent or surgical process.   Patient signed out to Ocie Cornfield, PA-C at shift change pending imaging.   Final Clinical Impressions(s) / ED Diagnoses   Final diagnoses:  Pain of upper abdomen  Nausea and vomiting, intractability of vomiting not specified, unspecified vomiting type    New Prescriptions New Prescriptions   No medications on file     Antonietta Breach, PA-C 06/17/16 [place], MD 06/17/16 [phone]

## 2016-06-17 NOTE — ED Provider Notes (Signed)
Patient presented to the ER with abdominal pain, nausea and vomiting. Patient with complex past medical history of chronic pancolitis with history of abscess, small bowel obstruction and fistula formation.  Face to face Exam: HEENT - PERRLA Lungs - CTAB Heart - RRR, no M/R/G Abd - soft, diffusely tender, no peritonitis Neuro - alert, oriented x3  Plan: CT scan   Orpah Greek, MD 06/17/16 [phone]

## 2016-06-17 NOTE — ED Provider Notes (Signed)
76 year old Caucasian female past medical history significant for Crohn's chronic by fistulas and abscesses S/PE ileostomy that presents today with abdominal pain, nausea, emesis. Patient was received and care hand off by PA [facility] pending a CT scan. CAT scan shows dilated bowel loops with fluid air level consistent with early or low-grade obstruction. Patient denies any emesis or nausea at this time. She does endorse continued abdominal pain. Spoke with Dr. Constance Holster with general surgery concerning findings. They will see patient as a consult. Like for me to speak with hospitalist for admission. They recommend putting an NG tube. Discussed with patient and she would like to wait to talk with the surgeon before NG tube was placed. Abdomen is soft and non distended. She does complain of pain.has not had any emesis or nausea since 6 AM. We'll get more pain medicine. Have consulted hospital medicine for admission.spoke with Dr. Arturo Morton we will see patient in place admission orders. Patient is currently hemodynamically stable. We'll hold on NG tube at this time. Patient up-to-date on plan of care.   Doristine Devoid, PA-C 06/17/16 0825    Orpah Greek, MD 06/18/16 [phone]

## 2016-06-17 NOTE — ED Notes (Signed)
Got patient up to bathroom and she could not give urine specimen. Said she would try later.

## 2016-06-17 NOTE — ED Notes (Signed)
Reminded pt that urine sample is needed

## 2016-06-17 NOTE — ED Notes (Signed)
Instructed by provider not to insert NG tube at this time.

## 2016-06-17 NOTE — Consult Note (Signed)
Reason for Consult: abdominal pain, nausea and vomiting Referring Physician:  Doristine Devoid, PA-C GI physician - Dr. Daniel Nones Sara Barnes is an 76 y.o. female.   HP: Presents with onset of nausea and vomiting all evening she reports feeling full and belching some yesterday. She was okay until last evening when she developed abdominal discomfort, nausea and vomiting last evening when she tried to eat. She reports ongoing fluid from her ileostomy during this.  The stool is less formed and more liquid, but continues to have ileostomy output even now.  Her nausea has been controlled with antiemetics in the ED. She is not vomited since 2 AM. She had more nausea with oral contrast but this also improved with additional antiemetic. She reports ongoing drainage from her rectum, and wears depends garment. Remains tender with ambulation, and she tends to lie down on her hips to avoid the discomfort of lying flat on her rectum.  She has a complex history of Crohn's disease with multiple abscesses, perirectal fissures. She is currently being followed by [facility], she's also been at [facility] where she saw GI, and is planning to see a colorectal surgeon.  She was hospitalized 01/07/16 through 02/29/16.  She underwent irrigation debridement perirectal abscess on 01/22/16, by Dr. Harlow Asa. She developed leakage around the seton with increased medical management, including steroids and Remicade. She also was receiving  increased local wound care.  On 02/11/16 she was taken back to the operating room with Crohn's colitis and underwent laparoscopic diverting ileostomy by Dr. Johnathan Hausen. Her steroids were slowly tapered, diet was increased with return of bowel function. This was slow, she was placed on TPN, for nutritional support during her stay. This was eventually discontinued as her oral intake improved.   She was most recently seen by Dr. 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 There is moderate colitis with 2 deep ulcerations in the distal rectum/anal canal. She is scheduled for GI evaluation at [facility] on 06/16/16, with Dr. Emelda Fear.  She presents now and is afebrile vital signs are stable. CMP is normal except for CO2 of 16 and a glucose of 122. WBC is 25,000, hemoglobin 17.2*hematocrit 48.1. Urine shows 6-30 white cells per high-power field.   CT scan shows:1. Postsurgical changes of bowel resection at the level of the terminal with a right lower quadrant ileostomy. There is mild dilatation of the small bowel extending to the ileostomy compatible with an early or low grade obstruction likely related to adhesion or stricture at the ileostomy. Clinical correlation and follow-up recommended. 2. Diffusely thickened colon likely related to underdistention. 3. Diffuse mesenteric edema and small ascites.  We are asked to see.  Past Medical History:  Diagnosis Date  . Allergy   . Anemia   . Basal cell carcinoma   . Blood transfusion without reported diagnosis   . 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 Cancer of lower-inner quadrant of left female breast ([place]) 11/30/2014  . Complication of anesthesia    anxiety post-op after ear surgeries  . Crohn's disease of large intestine with abscess ([place])   . Dental crowns present    recent root canal 11/2014  . Depression   . Family history of adverse reaction to anesthesia    pt's sister  has hx. of post-op N/V  . Indigestion   . PONV (postoperative nausea and vomiting)   . Runny nose 12/07/2014   clear drainage, per pt.  . Seasonal allergies   . Ulcerative colitis   . Vitamin B12 deficiency     Past Surgical History:  Procedure Laterality Date  . ABDOMINAL HYSTERECTOMY     partial  . APPENDECTOMY    . BASAL CELL  CARCINOMA EXCISION Left    nose  . BREAST ENHANCEMENT SURGERY    . BREAST LUMPECTOMY Left    age 82's  . BREAST LUMPECTOMY WITH RADIOACTIVE SEED AND SENTINEL LYMPH NODE BIOPSY Left 12/13/2014   Procedure: LEFT BREAST LUMPECTOMY WITH RADIOACTIVE SEED AND SENTINEL LYMPH NODE BIOPSY;  Surgeon: [name], MD;  Location: [facility];  Service: General;  Laterality: Left;  . COLONOSCOPY WITH PROPOFOL  05/28/2011; 06/28/2014  . FLEXIBLE SIGMOIDOSCOPY N/A 01/08/2016   Procedure: FLEXIBLE SIGMOIDOSCOPY;  Surgeon: [name], MD;  Location: [facility];  Service: Gastroenterology;  Laterality: N/A;  needs MAC due to pain and anxiety  . INCISION AND DRAINAGE PERIRECTAL ABSCESS N/A 09/28/2013   Procedure: IRRIGATION AND DEBRIDEMENT PERIANAL ABSCESS, proctoscopy;  Surgeon: Shann Medal, MD;  Location: [facility];  Service: General;  Laterality: N/A;  . INCISION AND DRAINAGE PERIRECTAL ABSCESS N/A 01/22/2016   Procedure: IRRIGATION AND DEBRIDEMENT PERIRECTAL ABSCESS;  Surgeon: Armandina Gemma, MD;  Location: [facility];  Service: General;  Laterality: N/A;  . INNER EAR SURGERY Bilateral   . LAPAROSCOPIC DIVERTED COLOSTOMY N/A 02/11/2016   Procedure: LAPAROSCOPIC DIVERTED ILEOSTOMY;  Surgeon: Johnathan Hausen, MD;  Location: [facility];  Service: General;  Laterality: N/A;    Family History  Problem Relation Age of Onset  . Prostate cancer Brother 42  . Kidney disease Brother   . Diabetes Brother   . Brain cancer Father   . Rheum arthritis Mother   . Anesthesia problems Sister     post-op N/V  . Emphysema Maternal Grandfather   . Asthma Sister   . Rheum arthritis Brother   . Colon cancer Neg Hx   . Esophageal cancer Neg Hx   . Breast cancer Neg Hx     Social History:  reports that she has never smoked. She has never used smokeless tobacco. She reports that she drinks alcohol. She reports that she does not use drugs.  Allergies:  Allergies  Allergen Reactions  . Codeine Nausea And  Vomiting  . Mesalamine Nausea And Vomiting  . Other Itching    PERFUMED LOTIONS  Cant have MRI due to ear implant   . Oxycodone-Acetaminophen Rash    Itching & rash Tolerates Dilaudid  . Sulfa Antibiotics Rash   Prior to Admission medications   Medication Sig Start Date End Date Taking? Authorizing Provider  Cyanocobalamin (VITAMIN B-12 IJ) Inject 1 mL as directed every 30 (thirty) days.   Yes Historical Provider, MD  famotidine (PEPCID) 40 MG tablet Take 1 tablet (40 mg total) by mouth 2 (two) times daily. Patient taking differently: Take 40 mg by mouth daily as needed for heartburn or indigestion.  02/29/16  Yes Jerrye Beavers, PA-C  FLUoxetine (PROZAC) 20 MG capsule Take 1 capsule (20 mg total) by mouth 2 (two) times daily. 03/31/16  Yes Jerene Bears, MD  Multiple Vitamins-Minerals (MULTIVITAMIN ADULTS 50+ PO) Take 0.5 tablets by mouth daily.   Yes Historical Provider, MD  predniSONE (DELTASONE) 20 MG tablet Take 1 tablet (20 mg total) by mouth daily with  breakfast. 06/05/16  Yes Jerene Bears, MD  zolpidem (AMBIEN) 10 MG tablet Take 1 tablet (10 mg total) by mouth at bedtime as needed for sleep. 04/17/16 06/17/16 Yes Jerene Bears, MD  betamethasone dipropionate (DIPROLENE) 0.05 % cream Apply topically 2 (two) times daily. Pharmacy-please d/c rx for clobetasol Patient not taking: Reported on 06/17/2016 04/09/16   Jerene Bears, MD     Results for orders placed or performed during the hospital encounter of 06/17/16 (from the past 48 hour(s))  Lipase, blood     Status: None   Collection Time: 06/17/16  1:58 AM  Result Value Ref Range   Lipase 21 11 - 51 U/L  Comprehensive metabolic panel     Status: Abnormal   Collection Time: 06/17/16  1:58 AM  Result Value Ref Range   Sodium 135 135 - 145 mmol/L   Potassium 3.9 3.5 - 5.1 mmol/L   Chloride 109 101 - 111 mmol/L   CO2 16 (L) 22 - 32 mmol/L   Glucose, Bld 122 (H) 65 - 99 mg/dL   BUN 13 6 - 20 mg/dL   Creatinine, Ser 0.74 0.44 - 1.00  mg/dL   Calcium 9.6 8.9 - 10.3 mg/dL   Total Protein 7.6 6.5 - 8.1 g/dL   Albumin 4.1 3.5 - 5.0 g/dL   AST 16 15 - 41 U/L   ALT 14 14 - 54 U/L   Alkaline Phosphatase 76 38 - 126 U/L   Total Bilirubin 0.6 0.3 - 1.2 mg/dL   GFR calc non Af Amer >60 >60 mL/min   GFR calc Af Amer >60 >60 mL/min    Comment: (NOTE) The eGFR has been calculated using the CKD EPI equation. This calculation has not been validated in all clinical situations. eGFR's persistently <60 mL/min signify possible Chronic Kidney Disease.    Anion gap 10 5 - 15  CBC     Status: Abnormal   Collection Time: 06/17/16  1:58 AM  Result Value Ref Range   WBC 25.1 (H) 4.0 - 10.5 K/uL   RBC 5.51 (H) 3.87 - 5.11 MIL/uL   Hemoglobin 17.2 (H) 12.0 - 15.0 g/dL   HCT 48.1 (H) 36.0 - 46.0 %   MCV 87.3 78.0 - 100.0 fL   MCH 31.2 26.0 - 34.0 pg   MCHC 35.8 30.0 - 36.0 g/dL   RDW 15.1 11.5 - 15.5 %   Platelets 312 150 - 400 K/uL  Urinalysis, Routine w reflex microscopic     Status: Abnormal   Collection Time: 06/17/16  6:15 AM  Result Value Ref Range   Color, Urine YELLOW YELLOW   APPearance CLEAR CLEAR   Specific Gravity, Urine 1.016 1.005 - 1.030   pH 5.0 5.0 - 8.0   Glucose, UA NEGATIVE NEGATIVE mg/dL   Hgb urine dipstick SMALL (A) NEGATIVE   Bilirubin Urine NEGATIVE NEGATIVE   Ketones, ur NEGATIVE NEGATIVE mg/dL   Protein, ur NEGATIVE NEGATIVE mg/dL   Nitrite NEGATIVE NEGATIVE   Leukocytes, UA NEGATIVE NEGATIVE   RBC / HPF 0-5 0 - 5 RBC/hpf   WBC, UA 6-30 0 - 5 WBC/hpf   Bacteria, UA RARE (A) NONE SEEN   Squamous Epithelial / LPF 0-5 (A) NONE SEEN   Mucous PRESENT    Hyaline Casts, UA PRESENT     Ct Abdomen Pelvis W Contrast  Result Date: 06/17/2016 CLINICAL DATA:  76 year old female with lower abdominal pain. 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 EXAM:  CT ABDOMEN AND PELVIS WITH CONTRAST TECHNIQUE: Multidetector CT imaging of the abdomen and pelvis was performed  using the standard protocol following bolus administration of intravenous contrast. CONTRAST:  100 cc Isovue-300 COMPARISON:  Abdominal radiograph dated 02/23/2016 and CT dated 01/21/2016 FINDINGS: Lower chest: Minimal bibasilar dependent atelectatic changes of the lungs. The visualized lung bases are otherwise clear. No intra-abdominal free air. Diffuse mesenteric edema and small free fluid. Hepatobiliary: No focal liver abnormality is seen. No gallstones, gallbladder wall thickening, or biliary dilatation. Pancreas: Unremarkable. No pancreatic ductal dilatation or surrounding inflammatory changes. Spleen: Normal in size without focal abnormality. Adrenals/Urinary Tract: Adrenal glands are unremarkable. Kidneys are normal, without renal calculi, focal lesion, or hydronephrosis. Bladder is unremarkable. Stomach/Bowel: There is postsurgical changes of resection of the terminal ileum with the right lower quadrant diverting ileostomy. There is mild diffuse prominence of fluid-filled loops of mid to distal small bowel extending to the level of the ileostomy compatible with low grade or early obstruction. The transition zone is at the ileostomy likely related to underlying adhesion or stricture. The colon is collapsed. Diffuse thickened appearance of the colon likely related to underdistention. Vascular/Lymphatic: There is mild aortoiliac atherosclerotic disease. The origins of the celiac axis, SMA, IMA as well as the origins of the renal arteries are patent. Nonocclusive scar noted at the origin of the SMA. The IVC is unremarkable. The SMV, splenic vein, and main portal vein are patent. No portal venous gas identified. There is no adenopathy. Reproductive: Hysterectomy. Other: Bilateral breast implants. Musculoskeletal: Degenerative changes of the spine. No acute fracture. IMPRESSION: 1. Postsurgical changes of bowel resection at the level of the terminal with a right lower quadrant ileostomy. There is mild dilatation  of the small bowel extending to the ileostomy compatible with an early or low grade obstruction likely related to adhesion or stricture at the ileostomy. Clinical correlation and follow-up recommended. 2. Diffusely thickened colon likely related to underdistention. 3. Diffuse mesenteric edema and small ascites. Electronically Signed   By: Anner Crete M.D.   On: 06/17/2016 07:17    Review of Systems  Constitutional: Positive for malaise/fatigue. Negative for chills, diaphoresis, fever and weight loss.  HENT: Positive for hearing loss (Chronic issue). Negative for congestion, ear discharge, ear pain, nosebleeds, sinus pain, sore throat and tinnitus.   Eyes: Negative.   Respiratory: Negative.  Negative for stridor.   Cardiovascular: Negative.   Gastrointestinal: Positive for abdominal pain, diarrhea (She has an ileostomy with chronic high output. She reports since that started the drainage has been more liquid, but persist.), heartburn, nausea and vomiting. Negative for blood in stool, constipation and melena.  Genitourinary: Negative.   Musculoskeletal: Negative.   Skin: Negative.   Neurological: Negative.  Negative for weakness.  Endo/Heme/Allergies: Negative.   Psychiatric/Behavioral: Positive for depression.   Blood pressure 137/93, pulse 79, temperature 97.6 F (36.4 C), temperature source Oral, resp. rate 20, SpO2 99 %. Physical Exam  Constitutional: She is oriented to person, place, and time.  Patient is an extremely anxious, frail-looking 76 year old female. She is extremely uncomfortable but in no acute distress.  HENT:  Head: Normocephalic and atraumatic.  Mouth/Throat: No oropharyngeal exudate.  Eyes: Right eye exhibits no discharge. Left eye exhibits no discharge. No scleral icterus.  Neck: Normal range of motion. Neck supple. No JVD present. No tracheal deviation present. No thyromegaly present.  Cardiovascular: Regular rhythm, normal heart sounds and intact distal pulses.    No murmur heard. Tachycardic  Respiratory: Effort normal and breath sounds  normal. No respiratory distress. She has no wheezes. She has no rales. She exhibits no tenderness.  GI: She exhibits distension. She exhibits no mass. There is tenderness. There is no rebound and no guarding.  Bowel sounds are hyperactive.  Genitourinary:  Genitourinary Comments: I did not do a rectal digital exam, she is extremely tender. She has sites on both left and right which are inflamed irritated and sites of drainage  Musculoskeletal: She exhibits no edema, tenderness or deformity.  Lymphadenopathy:    She has no cervical adenopathy.  Neurological: She is alert and oriented to person, place, and time. No cranial nerve deficit.  Skin: Skin is warm and dry. No rash noted. She is not diaphoretic. No erythema. No pallor.  Psychiatric: She has a normal mood and affect. Her behavior is normal. Judgment and thought content normal.    Assessment/Plan: SBO versus Crohn's flare.  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 History of Crohn's disease/ulcerative colitis - Dr. Hilarie Fredrickson, [facility] gastroenterology Dr. Emelda Fear, [facility]. History of horseshoe perirectal abscess, with incision and drainage/seton placement 01/22/16 Dr. Armandina Gemma. 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 Johnathan Hausen. History of left breast cancer History of depression History of prior abdominal hysterectomy/appendectomy laparoscopic diverting colostomy Hx of anxiety and depression   Plan:  No nausea or vomiting since around 2 Am, she has ongoing fluid coming into the ileostomy bag, less solid more fluid than normal.  I am not sure about the SBO diagnosis.  This may be a Crohn's flare.  Medicine has admitted and is contacting Dr. Hilarie Fredrickson, GI service to see her.    I have ask ED to hold off on the NG till Dr. Hilarie Fredrickson has a chance to see her.  If she has ongoing nausea and  vomiting we may have to place one, but she is so anxious that I will hold off for now.  JENNINGS,WILLARD 06/17/2016, 7:55 AM   Agree with above. In the room with Dr. Loletha Carrow. Saw Dr. Emelda Fear at [facility].  For now, medical management of her issues.  Her ileostomy is working - which hopefully means that her symptoms will improve.  Will follow.  [name], MD, [facility] Surgery Pager: [phone] Office phone:  [phone]   [name], MD, [facility] Surgery Pager: [phone] Office phone:  [phone]

## 2016-06-17 NOTE — Consult Note (Signed)
Referring Provider: [facility] Primary Care Physician:  Ann Held, DO Primary Gastroenterologist:   Zenovia Jarred, MD  Reason for Consultation:  Colitis , partial bowel obstruction  ASSESSMENT AND PLAN:   1. 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 Flexible sigmoidoscopy in late December showed bilateral fistula tracks without active drainage but colon biopsies showed moderately active colitis. She is s/p diverting end ileostomy October 2017 by Dr. Hassell Done. Treating her has been difficult. She has been intolerant to several ASAs, flagyl, and Tinidazole. She had 2 infusions of Remicade but felt it made her sicker. She has anxiety regarding ileostomy, scared it may become permanent. She is currently on prednisone at home.  Patient saw Dr. Emelda Fear, IBD specialist at [facility] yesterday for a second opinion. Note not yet in Care Everywhere. Patient currently admitted with acute vomiting, abdominal pain and early SBO on CTscan. Adhesions ? , stricture ?Marland Kitchen No suggestion of inflammation on imaging and she has never had small bowel involvement to this point -supportive care with IV fluids, anti-emetics, NPO -General Surgery is following.  -steroid replacement since prednisone is on hold. She is now on solumedrol 1m daily  2. Depression. She is very worried about prozac being held. Will give prozac with sips of water and she is no longer vomiting and passing flatus / stool.    HPI: CMAECI KALBFLEISCHis a 76y.o. female known to Dr. PHilarie Fredrickson She has indeterminate colitis (Crohn's vrs UC) complicated with perirectal abscess. She is s/p end ileostomy. She had a flexible sigmoidoscopy in late December. Bilateral fistula tracks from prior abscess was seen but there was no active drainage. There was moderated colitis of entire examined colon with two deep ulcerations in the distal rectum / anal canal. Two inflammatory polyps were biopsied  and c/w moderately active chronic colitis without dysplasia. Seen in office on 2/8, she was feeling okay but sttill had frequent drainage of mucous and bloody material from rectum as well as some lower abdominal discomfort. Her prednisone was increased from 128mto 2034mShe has been intolerant to flagyl and Tinidazole secondary to nausea. She received a couple of doses of Remicade in September and believes it made her sicker.  It has been difficult to manage her colitis due to fear of medications.  She is followed by Dr. MarHassell Done [facility]. Note not yet in Car[facility]ert patient states that colorectal surgery evaluation recommended .   [place] patient developed diffuse, constant abdominal pain , nausea and vomiting. .WBC 25K (it was 13K on 06/06/15). Hgb 17,normal renal function.  CTscan reveals mild dilatation of small bowel extending to the ileostomy c/w early or low grade obstruction from adhesions or stricture at ileostomy. She vomited several times, feels better today. Her ostomy output has been more watery and less volume than usual.    Past Medical History:  Diagnosis Date  . Allergy   . Anemia   . Basal cell carcinoma   . Blood transfusion without reported diagnosis   . Cancer of [place]inner quadrant of [place]07[place]   left  . 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 Complication of anesthesia    anxiety post-op after ear surgeries  . Cro[place] disease of large intestine with abscess ([facility] . Dental crowns present    recent  root canal 11/2014  . Depression   . Family history of adverse reaction to anesthesia    pt's sister has hx. of post-op N/V  . Indigestion   . PONV (postoperative nausea and vomiting)   . Runny nose 12/07/2014   clear drainage, per pt.  . Seasonal allergies   . Ulcerative colitis   . Vitamin B12 deficiency     Past  Surgical History:  Procedure Laterality Date  . ABDOMINAL HYSTERECTOMY     partial  . APPENDECTOMY    . BASAL CELL CARCINOMA EXCISION Left    nose  . BREAST ENHANCEMENT SURGERY    . BREAST LUMPECTOMY Left    age 69's  . BREAST LUMPECTOMY WITH RADIOACTIVE SEED AND SENTINEL LYMPH NODE BIOPSY Left 12/13/2014   Procedure: LEFT BREAST LUMPECTOMY WITH RADIOACTIVE SEED AND SENTINEL LYMPH NODE BIOPSY;  Surgeon: [name], MD;  Location: [facility];  Service: General;  Laterality: Left;  . COLONOSCOPY WITH PROPOFOL  05/28/2011; 06/28/2014  . FLEXIBLE SIGMOIDOSCOPY N/A 01/08/2016   Procedure: FLEXIBLE SIGMOIDOSCOPY;  Surgeon: [name], MD;  Location: [facility];  Service: Gastroenterology;  Laterality: N/A;  needs MAC due to pain and anxiety  . INCISION AND DRAINAGE PERIRECTAL ABSCESS N/A 09/28/2013   Procedure: IRRIGATION AND DEBRIDEMENT PERIANAL ABSCESS, proctoscopy;  Surgeon: Shann Medal, MD;  Location: [facility];  Service: General;  Laterality: N/A;  . INCISION AND DRAINAGE PERIRECTAL ABSCESS N/A 01/22/2016   Procedure: IRRIGATION AND DEBRIDEMENT PERIRECTAL ABSCESS;  Surgeon: Armandina Gemma, MD;  Location: [facility];  Service: General;  Laterality: N/A;  . INNER EAR SURGERY Bilateral   . LAPAROSCOPIC DIVERTED COLOSTOMY N/A 02/11/2016   Procedure: LAPAROSCOPIC DIVERTED ILEOSTOMY;  Surgeon: Johnathan Hausen, MD;  Location: [facility];  Service: General;  Laterality: N/A;    Prior to Admission medications   Medication Sig Start Date End Date Taking? Authorizing Provider  Cyanocobalamin (VITAMIN B-12 IJ) Inject 1 mL as directed every 30 (thirty) days.   Yes Historical Provider, MD  famotidine (PEPCID) 40 MG tablet Take 1 tablet (40 mg total) by mouth 2 (two) times daily. Patient taking differently: Take 40 mg by mouth daily as needed for heartburn or indigestion.  02/29/16  Yes Jerrye Beavers, PA-C  FLUoxetine (PROZAC) 20 MG capsule Take 1 capsule (20 mg total) by mouth 2 (two) times  daily. 03/31/16  Yes Jerene Bears, MD  Multiple Vitamins-Minerals (MULTIVITAMIN ADULTS 50+ PO) Take 0.5 tablets by mouth daily.   Yes Historical Provider, MD  predniSONE (DELTASONE) 20 MG tablet Take 1 tablet (20 mg total) by mouth daily with breakfast. 06/05/16  Yes Jerene Bears, MD  zolpidem (AMBIEN) 10 MG tablet Take 1 tablet (10 mg total) by mouth at bedtime as needed for sleep. 04/17/16 06/17/16 Yes Jerene Bears, MD  betamethasone dipropionate (DIPROLENE) 0.05 % cream Apply topically 2 (two) times daily. Pharmacy-please d/c rx for clobetasol Patient not taking: Reported on 06/17/2016 04/09/16   Jerene Bears, MD    Current Facility-Administered Medications  Medication Dose Route Frequency Provider Last Rate Last Dose  . 0.9 % NaCl with KCl 20 mEq/ L  infusion   Intravenous Continuous Orson Eva, MD      . acetaminophen (TYLENOL) tablet 650 mg  650 mg Oral Q6H PRN Orson Eva, MD       Or  . acetaminophen (TYLENOL) suppository 650 mg  650 mg Rectal Q6H PRN Orson Eva, MD      . enoxaparin (LOVENOX) injection  40 mg  40 mg Subcutaneous Q24H Orson Eva, MD      . famotidine (PEPCID) IVPB 20 mg premix  20 mg Intravenous Q12H Orson Eva, MD      . HYDROmorphone (DILAUDID) injection 0.5 mg  0.5 mg Intravenous Q3H PRN Shanon Brow Tat, MD      . methylPREDNISolone sodium succinate (SOLU-MEDROL) 40 mg/mL injection 40 mg  40 mg Intravenous Daily Shanon Brow Tat, MD      . ondansetron (ZOFRAN) tablet 4 mg  4 mg Oral Q6H PRN Orson Eva, MD       Or  . ondansetron (ZOFRAN) injection 4 mg  4 mg Intravenous Q6H PRN Shanon Brow Tat, MD      . sodium chloride 0.9 % injection           . zolpidem (AMBIEN) tablet 5 mg  5 mg Oral QHS PRN Orson Eva, MD        Allergies as of 06/17/2016 - Review Complete 06/17/2016  Allergen Reaction Noted  . Codeine Nausea And Vomiting 01/20/2008  . Mesalamine Nausea And Vomiting 05/13/2011  . Other Itching 12/07/2014  . Oxycodone-acetaminophen Rash 01/04/2015  . Sulfa antibiotics Rash 12/07/2014     Family History  Problem Relation Age of Onset  . Prostate cancer Brother 34  . Kidney disease Brother   . Diabetes Brother   . Brain cancer Father   . Rheum arthritis Mother   . Anesthesia problems Sister     post-op N/V  . Emphysema Maternal Grandfather   . Asthma Sister   . Rheum arthritis Brother   . Colon cancer Neg Hx   . Esophageal cancer Neg Hx   . Breast cancer Neg Hx     Social History   Social History  . Marital status: Widowed    Spouse name: N/A  . Number of children: 1  . Years of education: N/A   Occupational History  . 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 Smoking status: Never Smoker  . Smokeless tobacco: Never Used  . Alcohol use 0.0 oz/week     Comment: daily wine with dinner  . Drug use: No  . Sexual activity: Not Currently    Partners: Male   Other Topics Concern  . Not on file   Social History Narrative   Treadmill/ walking--exercise    Review of Systems: All systems reviewed and negative except where noted in HPI.  Physical Exam: Vital signs in last 24 hours: Temp:  [97.6 F (36.4 C)-97.7 F (36.5 C)] 97.7 F (36.5 C) (02/20 0933) Pulse Rate:  [79-84] 81 (02/20 0933) Resp:  [17-20] 17 (02/20 0933) BP: (126-137)/(81-93) 126/81 (02/20 0907) SpO2:  [98 %-99 %] 98 % (02/20 0933) Weight:  [140 lb (63.5 kg)] 140 lb (63.5 kg) (02/20 0933)   General:   Alert,  Well-developed, well-nourished, pleasant and cooperative in NAD Eyes:  Sclera clear, no icterus.   Conjunctiva pink. Ears:  Normal auditory acuity. Nose:  No deformity, discharge,  or lesions. Neck:  Supple; no masses or thyromegaly. Lungs:  Clear throughout to auscultation.   No wheezes, crackles, or rhonchi.  Heart:  Regular rate and rhythm; no murmurs, clicks, rubs,  or gallops. Abdomen:  Soft,nondistended, active bowel sounds, no palp mass, + flatus in ostomy bag. Thick yellowish liquid stool in ostomy  Rectal:  Deferred  Msk:   Symmetrical without gross deformities. . Pulses:  Normal pulses noted. Extremities:  Without clubbing or  edema. Neurologic:  Alert and  oriented x4;  grossly normal neurologically. Skin:  Intact without significant lesions or rashes.. Psych:  Alert and cooperative. Normal mood and affect.  Intake/Output from previous day: No intake/output data recorded. Intake/Output this shift: No intake/output data recorded.  Lab Results:  Recent Labs  06/17/16 0158  WBC 25.1*  HGB 17.2*  HCT 48.1*  PLT 312   BMET  Recent Labs  06/17/16 0158  NA 135  K 3.9  CL 109  CO2 16*  GLUCOSE 122*  BUN 13  CREATININE 0.74  CALCIUM 9.6   LFT  Recent Labs  06/17/16 0158  PROT 7.6  ALBUMIN 4.1  AST 16  ALT 14  ALKPHOS 76  BILITOT 0.6    Studies/Results: Ct Abdomen Pelvis W Contrast  Result Date: 06/17/2016 CLINICAL DATA:  76 year old female with lower abdominal pain. 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 EXAM: CT ABDOMEN AND PELVIS WITH CONTRAST TECHNIQUE: Multidetector CT imaging of the abdomen and pelvis was performed using the standard protocol following bolus administration of intravenous contrast. CONTRAST:  100 cc Isovue-300 COMPARISON:  Abdominal radiograph dated 02/23/2016 and CT dated 01/21/2016 FINDINGS: Lower chest: Minimal bibasilar dependent atelectatic changes of the lungs. The visualized lung bases are otherwise clear. No intra-abdominal free air. Diffuse mesenteric edema and small free fluid. Hepatobiliary: No focal liver abnormality is seen. No gallstones, gallbladder wall thickening, or biliary dilatation. Pancreas: Unremarkable. No pancreatic ductal dilatation or surrounding inflammatory changes. Spleen: Normal in size without focal abnormality. Adrenals/Urinary Tract: Adrenal glands are unremarkable. Kidneys are normal, without renal calculi, focal lesion, or hydronephrosis. Bladder is unremarkable. Stomach/Bowel: There is  postsurgical changes of resection of the terminal ileum with the right lower quadrant diverting ileostomy. There is mild diffuse prominence of fluid-filled loops of mid to distal small bowel extending to the level of the ileostomy compatible with low grade or early obstruction. The transition zone is at the ileostomy likely related to underlying adhesion or stricture. The colon is collapsed. Diffuse thickened appearance of the colon likely related to underdistention. Vascular/Lymphatic: There is mild aortoiliac atherosclerotic disease. The origins of the celiac axis, SMA, IMA as well as the origins of the renal arteries are patent. Nonocclusive scar noted at the origin of the SMA. The IVC is unremarkable. The SMV, splenic vein, and main portal vein are patent. No portal venous gas identified. There is no adenopathy. Reproductive: Hysterectomy. Other: Bilateral breast implants. Musculoskeletal: Degenerative changes of the spine. No acute fracture. IMPRESSION: 1. Postsurgical changes of bowel resection at the level of the terminal with a right lower quadrant ileostomy. There is mild dilatation of the small bowel extending to the ileostomy compatible with an early or low grade obstruction likely related to adhesion or stricture at the ileostomy. Clinical correlation and follow-up recommended. 2. Diffusely thickened colon likely related to underdistention. 3. Diffuse mesenteric edema and small ascites. Electronically Signed   By: Anner Crete M.D.   On: 06/17/2016 07:17     Tye Savoy, NP-C @  06/17/2016, 9:42 AM  Pager number [phone]  I have reviewed the entire case in detail with the above APP and discussed the plan in detail.  Therefore, I agree with the diagnoses recorded above. In addition,  I have personally interviewed and examined the patient and have personally reviewed any abdominal/pelvic CT scan images.  My additional thoughts are as follows:  Add'l exam: perianal exam with Dr Lucia Gaskins  of surgery in attendance reveals b/l chronic  fistulous tracts without drainage, tenderness or fluctuance.   She has indeterminate colitis favoring Crohn's but has never had ileal disease.  It sounds like total proctocolectomy is being considered.  At this time she has a PSBO at the ileostomy site.  Much be mechanical since there is no active ileal disease.  No longer vomiting, but also not eating.  Significant leukocytosis but without intra-abdominal abscess on CT scan.  It appears that surgery hopes to mange conservatively with bowel rest.  Not vomiting and abdomen soft without signs of bowel ischemia, so does not appear to need NGT at present.  We have nothing further to add, as her only IBD treatment now is prednisone.  Continue IV steroids at a dose equivalent to her home prednisone dose of 20 mg daily.  Remainder of management per surgical consultants.  Hopefully, she will continue to improve with conservative management.  Signing off, please call as the need arises.  Nelida Meuse III Pager [phone]  Mon-Fri 8a-5p [phone] after 5p, weekends, holidays

## 2016-06-17 NOTE — ED Notes (Signed)
ED Provider at bedside. 

## 2016-06-17 NOTE — ED Triage Notes (Signed)
Pt c/o abdominal pain in his lower abdominal quadrants. She was seen at [facility] today for a second opinion about surgery for her rectal fistulas. She is complaining of nausea and vomiting all evening. She has a hx of UC and Crohn's. Pt HOH. Pt has an ileostomy.

## 2016-06-18 ENCOUNTER — Inpatient Hospital Stay (HOSPITAL_COMMUNITY): Payer: Medicare HMO

## 2016-06-18 DIAGNOSIS — K51011 Ulcerative (chronic) pancolitis with rectal bleeding: Secondary | ICD-10-CM

## 2016-06-18 DIAGNOSIS — D72823 Leukemoid reaction: Secondary | ICD-10-CM

## 2016-06-18 DIAGNOSIS — R112 Nausea with vomiting, unspecified: Secondary | ICD-10-CM

## 2016-06-18 LAB — BASIC METABOLIC PANEL
ANION GAP: 6 (ref 5–15)
BUN: 12 mg/dL (ref 6–20)
CALCIUM: 9.3 mg/dL (ref 8.9–10.3)
CO2: 20 mmol/L — ABNORMAL LOW (ref 22–32)
Chloride: 111 mmol/L (ref 101–111)
Creatinine, Ser: 0.78 mg/dL (ref 0.44–1.00)
GFR calc Af Amer: >60 mL/min (ref >60)
GLUCOSE: 80 mg/dL (ref 65–99)
POTASSIUM: 4.1 mmol/L (ref 3.5–5.1)
SODIUM: 137 mmol/L (ref 135–145)

## 2016-06-18 LAB — CBC
HCT: 45.6 % (ref 36.0–46.0)
HEMOGLOBIN: 15.5 g/dL — AB (ref 12.0–15.0)
MCH: 30.8 pg (ref 26.0–34.0)
MCHC: 34 g/dL (ref 30.0–36.0)
MCV: 90.7 fL (ref 78.0–100.0)
Platelets: 254 10*3/uL (ref 150–400)
RBC: 5.03 MIL/uL (ref 3.87–5.11)
RDW: 15.6 % — AB (ref 11.5–15.5)
WBC: 10.3 10*3/uL (ref 4.0–10.5)

## 2016-06-18 LAB — URINE CULTURE: Culture: <10000 — AB

## 2016-06-18 MED ORDER — PREDNISONE 20 MG PO TABS
20.0000 mg | ORAL_TABLET | Freq: Every day | ORAL | Status: DC
  Administered 2016-06-19: 20 mg via ORAL
  Filled 2016-06-18: qty 1

## 2016-06-18 MED ORDER — DIATRIZOATE MEGLUMINE & SODIUM 66-10 % PO SOLN
90.0000 mL | Freq: Once | ORAL | Status: AC
Start: 2016-06-18 — End: 2016-06-18
  Administered 2016-06-18: 90 mL via ORAL
  Filled 2016-06-18: qty 90

## 2016-06-18 NOTE — Progress Notes (Signed)
PROGRESS NOTE    Sara Barnes  VOZ:[id number] DOB: 1941-04-08 DOA: 06/17/2016 PCP: Ann Held, DO   Brief Narrative: Sara Barnes is a 76 y.o.  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 Her abdominal pain has started to improve.    Assessment & Plan:   Active Problems:   Leucocytosis   Ulcerative pancolitis with abscess (HCC)   Ulcerative pancolitis with rectal bleeding (HCC)   Small bowel obstruction   Nausea and vomiting   Abdominal pain Concern for IBD flare. GI has seen patient and feels this is likely not related to IBD. CT scan suggest likely bowel obstruction. Appears to be improving. Patient passing gas -general surgery recommendations -advance diet per surgery recommendations  Ulcerative colitis -Restart oral prednisone and discontinue solumedrol IV  Dehydration Improved secondary to IV fluids -keep IV fluids until tolerating oral hydration  Anxiety -continue fluoxetine  Leukocytosis Resolved.  Insomnia -continue Ambien   DVT prophylaxis: Lovenox Code Status: Full code Family Communication: None at bedside Disposition Plan: Discharge pending clinical improvement   Consultants:   Gastroenterology  General surgery  Procedures:   None  Antimicrobials:  None    Subjective: Patient reports improvement of symptoms. She is still having some intermittent abdominal pain but it is less frequent.  Objective: Vitals:   06/17/16 1405 06/17/16 2109 06/18/16 0118 06/18/16 0600  BP: 108/69 121/66 125/74 136/72  Pulse: 77 63 63 63  Resp: 16 16 16 18   Temp: 99.1 F (37.3 C) 98 F (36.7 C) 97.8 F (36.6 C) 97.7 F (36.5 C)  TempSrc: Oral Oral Oral Oral  SpO2: 95% 97% 100% 100%  Weight:      Height:        Intake/Output Summary (Last 24 hours) at 06/18/16  0931 Last data filed at 06/18/16 0825  Gross per 24 hour  Intake             1680 ml  Output              900 ml  Net              780 ml   Filed Weights   06/17/16 0933  Weight: 63.5 kg (140 lb)    Examination:  General exam: Appears calm and comfortable Respiratory system: Clear to auscultation. Respiratory effort normal. Cardiovascular system: S1 & S2 heard, RRR. No murmurs. Gastrointestinal system: Abdomen is nondistended, soft and nontender. Normal bowel sounds heard. Central nervous system: Alert and oriented. Difficulty hearing. Extremities: No edema. No calf tenderness Skin: No cyanosis. No rashes Psychiatry: Judgement and insight appear normal. Mood & affect slightly anxious and normal.     Data Reviewed: I have personally reviewed following labs and imaging studies  CBC:  Recent Labs Lab 06/17/16 0158 06/18/16 0635  WBC 25.1* 10.3  HGB 17.2* 15.5*  HCT 48.1* 45.6  MCV 87.3 90.7  PLT 312 425   Basic Metabolic Panel:  Recent Labs Lab 06/17/16 0158 06/18/16 0635  NA 135 137  K 3.9 4.1  CL 109 111  CO2 16* 20*  GLUCOSE 122* 80  BUN 13 12  CREATININE 0.74 0.78  CALCIUM 9.6 9.3   GFR: Estimated Creatinine Clearance: 57.8 mL/min (by C-G formula based on SCr of 0.78 mg/dL). Liver Function Tests:  Recent Labs Lab 06/17/16 0158  AST 16  ALT 14  ALKPHOS  76  BILITOT 0.6  PROT 7.6  ALBUMIN 4.1    Recent Labs Lab 06/17/16 0158  LIPASE 21   No results for input(s): AMMONIA in the last 168 hours. Coagulation Profile: No results for input(s): INR, PROTIME in the last 168 hours. Cardiac Enzymes: No results for input(s): CKTOTAL, CKMB, CKMBINDEX, TROPONINI in the last 168 hours. BNP (last 3 results) No results for input(s): PROBNP in the last 8760 hours. HbA1C: No results for input(s): HGBA1C in the last 72 hours. CBG: No results for input(s): GLUCAP in the last 168 hours. Lipid Profile: No results for input(s): CHOL, HDL, LDLCALC, TRIG,  CHOLHDL, LDLDIRECT in the last 72 hours. Thyroid Function Tests: No results for input(s): TSH, T4TOTAL, FREET4, T3FREE, THYROIDAB in the last 72 hours. Anemia Panel: No results for input(s): VITAMINB12, FOLATE, FERRITIN, TIBC, IRON, RETICCTPCT in the last 72 hours. Sepsis Labs: No results for input(s): PROCALCITON, LATICACIDVEN in the last 168 hours.  Recent Results (from the past 240 hour(s))  Urine culture     Status: Abnormal   Collection Time: 06/17/16  6:15 AM  Result Value Ref Range Status   Specimen Description URINE, CLEAN CATCH  Final   Special Requests NONE  Final   Culture (A)  Final    <10,000 COLONIES/mL INSIGNIFICANT GROWTH Performed at [facility], [address], [place], [place] [address]    Report Status 06/18/2016 FINAL  Final         Radiology Studies: Ct Abdomen Pelvis W Contrast  Result Date: 06/17/2016 CLINICAL DATA:  76 year old female with lower abdominal pain. 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 EXAM: CT ABDOMEN AND PELVIS WITH CONTRAST TECHNIQUE: Multidetector CT imaging of the abdomen and pelvis was performed using the standard protocol following bolus administration of intravenous contrast. CONTRAST:  100 cc Isovue-300 COMPARISON:  Abdominal radiograph dated 02/23/2016 and CT dated 01/21/2016 FINDINGS: Lower chest: Minimal bibasilar dependent atelectatic changes of the lungs. The visualized lung bases are otherwise clear. No intra-abdominal free air. Diffuse mesenteric edema and small free fluid. Hepatobiliary: No focal liver abnormality is seen. No gallstones, gallbladder wall thickening, or biliary dilatation. Pancreas: Unremarkable. No pancreatic ductal dilatation or surrounding inflammatory changes. Spleen: Normal in size without focal abnormality. Adrenals/Urinary Tract: Adrenal glands are unremarkable. Kidneys are normal, without renal calculi, focal lesion, or hydronephrosis. Bladder is  unremarkable. Stomach/Bowel: There is postsurgical changes of resection of the terminal ileum with the right lower quadrant diverting ileostomy. There is mild diffuse prominence of fluid-filled loops of mid to distal small bowel extending to the level of the ileostomy compatible with low grade or early obstruction. The transition zone is at the ileostomy likely related to underlying adhesion or stricture. The colon is collapsed. Diffuse thickened appearance of the colon likely related to underdistention. Vascular/Lymphatic: There is mild aortoiliac atherosclerotic disease. The origins of the celiac axis, SMA, IMA as well as the origins of the renal arteries are patent. Nonocclusive scar noted at the origin of the SMA. The IVC is unremarkable. The SMV, splenic vein, and main portal vein are patent. No portal venous gas identified. There is no adenopathy. Reproductive: Hysterectomy. Other: Bilateral breast implants. Musculoskeletal: Degenerative changes of the spine. No acute fracture. IMPRESSION: 1. Postsurgical changes of bowel resection at the level of the terminal with a right lower quadrant ileostomy. There is mild dilatation of the small bowel extending to the ileostomy compatible with an early or low grade obstruction likely related to adhesion or stricture  at the ileostomy. Clinical correlation and follow-up recommended. 2. Diffusely thickened colon likely related to underdistention. 3. Diffuse mesenteric edema and small ascites. Electronically Signed   By: Anner Crete M.D.   On: 06/17/2016 07:17        Scheduled Meds: . chlorhexidine  15 mL Mouth Rinse BID  . enoxaparin (LOVENOX) injection  40 mg Subcutaneous Q24H  . famotidine (PEPCID) IV  20 mg Intravenous Q12H  . FLUoxetine  20 mg Oral Daily  . mouth rinse  15 mL Mouth Rinse q12n4p  . methylPREDNISolone (SOLU-MEDROL) injection  40 mg Intravenous Daily   Continuous Infusions: . 0.9 % NaCl with KCl 20 mEq / L 75 mL/hr at 06/17/16 2251      LOS: 1 day     Cordelia Poche [facility] 06/18/2016, 9:31 AM Pager: [phone]  If 7PM-7AM, please contact night-coverage [email or web address] Password [facility] 06/18/2016, 9:31 AM

## 2016-06-18 NOTE — Progress Notes (Signed)
Subjective: She says she feels better, some cramping, but overall better.  No nausea or vomiting.   Really wants something PO.  Tired after bathing.  Objective: Vital signs in last 24 hours: Temp:  [97.7 F (36.5 C)-99.1 F (37.3 C)] 97.7 F (36.5 C) (02/21 0600) Pulse Rate:  [63-77] 63 (02/21 0600) Resp:  [16-18] 18 (02/21 0600) BP: (108-136)/(66-74) 136/72 (02/21 0600) SpO2:  [95 %-100 %] 100 % (02/21 0600) Last BM Date: 06/18/16 1650 IV 650 urine Stool output x 6 Afebrile, VSS Lab OK Will get a film Intake/Output from previous day: 02/20 0701 - 02/21 0700 In: 1650 [I.V.:1500; IV Piggyback:150] Out: 770 [Urine:650; Stool:120] Intake/Output this shift: Total I/O In: 30 [P.O.:30] Out: 130 [Urine:130]  General appearance: alert, cooperative and no distress GI: soft, still tight, BS hypoactive, no pain.  Clear green fluid fromt he ileostomy bag.  Lab Results:   Recent Labs  06/17/16 0158 06/18/16 0635  WBC 25.1* 10.3  HGB 17.2* 15.5*  HCT 48.1* 45.6  PLT 312 254    BMET  Recent Labs  06/17/16 0158 06/18/16 0635  NA 135 137  K 3.9 4.1  CL 109 111  CO2 16* 20*  GLUCOSE 122* 80  BUN 13 12  CREATININE 0.74 0.78  CALCIUM 9.6 9.3   PT/INR No results for input(s): LABPROT, INR in the last 72 hours.   Recent Labs Lab 06/17/16 0158  AST 16  ALT 14  ALKPHOS 76  BILITOT 0.6  PROT 7.6  ALBUMIN 4.1     Lipase     Component Value Date/Time   LIPASE 21 06/17/2016 0158     Studies/Results: Ct Abdomen Pelvis W Contrast  Result Date: 06/17/2016 CLINICAL DATA:  76 year old female with lower abdominal pain. 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 EXAM: CT ABDOMEN AND PELVIS WITH CONTRAST TECHNIQUE: Multidetector CT imaging of the abdomen and pelvis was performed using the standard protocol following bolus administration of intravenous contrast. CONTRAST:  100 cc Isovue-300 COMPARISON:  Abdominal  radiograph dated 02/23/2016 and CT dated 01/21/2016 FINDINGS: Lower chest: Minimal bibasilar dependent atelectatic changes of the lungs. The visualized lung bases are otherwise clear. No intra-abdominal free air. Diffuse mesenteric edema and small free fluid. Hepatobiliary: No focal liver abnormality is seen. No gallstones, gallbladder wall thickening, or biliary dilatation. Pancreas: Unremarkable. No pancreatic ductal dilatation or surrounding inflammatory changes. Spleen: Normal in size without focal abnormality. Adrenals/Urinary Tract: Adrenal glands are unremarkable. Kidneys are normal, without renal calculi, focal lesion, or hydronephrosis. Bladder is unremarkable. Stomach/Bowel: There is postsurgical changes of resection of the terminal ileum with the right lower quadrant diverting ileostomy. There is mild diffuse prominence of fluid-filled loops of mid to distal small bowel extending to the level of the ileostomy compatible with low grade or early obstruction. The transition zone is at the ileostomy likely related to underlying adhesion or stricture. The colon is collapsed. Diffuse thickened appearance of the colon likely related to underdistention. Vascular/Lymphatic: There is mild aortoiliac atherosclerotic disease. The origins of the celiac axis, SMA, IMA as well as the origins of the renal arteries are patent. Nonocclusive scar noted at the origin of the SMA. The IVC is unremarkable. The SMV, splenic vein, and main portal vein are patent. No portal venous gas identified. There is no adenopathy. Reproductive: Hysterectomy. Other: Bilateral breast implants. Musculoskeletal: Degenerative changes of the spine. No acute fracture. IMPRESSION: 1. Postsurgical changes of bowel resection at the level of the terminal with a right  lower quadrant ileostomy. There is mild dilatation of the small bowel extending to the ileostomy compatible with an early or low grade obstruction likely related to adhesion or stricture  at the ileostomy. Clinical correlation and follow-up recommended. 2. Diffusely thickened colon likely related to underdistention. 3. Diffuse mesenteric edema and small ascites. Electronically Signed   By: Anner Crete M.D.   On: 06/17/2016 07:17    Medications: . chlorhexidine  15 mL Mouth Rinse BID  . enoxaparin (LOVENOX) injection  40 mg Subcutaneous Q24H  . famotidine (PEPCID) IV  20 mg Intravenous Q12H  . FLUoxetine  20 mg Oral Daily  . mouth rinse  15 mL Mouth Rinse q12n4p  . methylPREDNISolone (SOLU-MEDROL) injection  40 mg Intravenous Daily   . 0.9 % NaCl with KCl 20 mEq / L 75 mL/hr at 06/17/16 2251   Assessment/Plan SBO versus Crohn's flare.  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 History of Crohn's disease/ulcerative colitis - Dr. Hilarie Fredrickson, [facility] gastroenterology Dr. Emelda Fear, [facility]. History of horseshoe perirectal abscess, with incision and drainage/seton placement 01/22/16 Dr. Armandina Gemma. 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 Johnathan Hausen. History of left breast cancer History of depression History of prior abdominal hysterectomy/appendectomy laparoscopic diverting colostomy Hx of anxiety and depression FEN:  IV fluids/NPO ID:  No antibiotics DVT:  Lovenox    Plan:  I will recheck the film and give her some clears, I told her to go slow and walk in the halls.  Film:  Supine and upright images were obtained. There are multiple loops of borderline dilated small bowel with scattered air-fluid levels. There is a right side ostomy. No free air. Visualized lung bases are clear.  I will try the SB protocol with oral contrast and see how she does with it.   LOS: 1 day    JENNINGS,WILLARD 06/18/2016 [phone]  Agree with above.  Seems better.  Taking some clear liquids. Her WBC has come down quickly. Protocol as above.  [name], MD, [facility] Surgery Pager:  [phone] Office phone:  [phone]

## 2016-06-19 ENCOUNTER — Inpatient Hospital Stay (HOSPITAL_COMMUNITY): Payer: Medicare HMO

## 2016-06-19 MED ORDER — LORAZEPAM 2 MG/ML IJ SOLN
1.0000 mg | INTRAMUSCULAR | Status: DC | PRN
  Administered 2016-06-19: 1 mg via INTRAVENOUS
  Filled 2016-06-19: qty 1

## 2016-06-19 MED ORDER — LIDOCAINE-EPINEPHRINE-TETRACAINE (LET) TOPICAL GEL
3.0000 mL | Freq: Once | TOPICAL | Status: DC
  Filled 2016-06-19: qty 3

## 2016-06-19 MED ORDER — HYDROMORPHONE HCL 2 MG/ML IJ SOLN
0.5000 mg | Freq: Once | INTRAMUSCULAR | Status: AC
Start: 2016-06-19 — End: 2016-06-19
  Administered 2016-06-19: 0.5 mg via INTRAVENOUS

## 2016-06-19 MED ORDER — HYDROMORPHONE HCL 2 MG/ML IJ SOLN
1.0000 mg | INTRAMUSCULAR | Status: DC | PRN
  Administered 2016-06-19 (×3): 1 mg via INTRAVENOUS
  Filled 2016-06-19 (×3): qty 1

## 2016-06-19 MED ORDER — LIDOCAINE VISCOUS 2 % MT SOLN
15.0000 mL | Freq: Once | OROMUCOSAL | Status: DC
  Filled 2016-06-19: qty 15

## 2016-06-19 MED ORDER — PROCHLORPERAZINE EDISYLATE 5 MG/ML IJ SOLN
5.0000 mg | Freq: Once | INTRAMUSCULAR | Status: AC
  Administered 2016-06-19: 5 mg via INTRAVENOUS
  Filled 2016-06-19: qty 2

## 2016-06-19 MED ORDER — METHYLPREDNISOLONE SODIUM SUCC 40 MG IJ SOLR
40.0000 mg | Freq: Every day | INTRAMUSCULAR | Status: DC
  Administered 2016-06-20 – 2016-06-23 (×4): 40 mg via INTRAVENOUS
  Filled 2016-06-19 (×4): qty 1

## 2016-06-19 MED ORDER — DIAZEPAM 5 MG/ML IJ SOLN
5.0000 mg | Freq: Once | INTRAMUSCULAR | Status: DC
  Filled 2016-06-19: qty 2

## 2016-06-19 MED ORDER — POTASSIUM CHLORIDE IN NACL 20-0.45 MEQ/L-% IV SOLN
INTRAVENOUS | Status: DC
  Administered 2016-06-19: 21:00:00 via INTRAVENOUS
  Administered 2016-06-20: 1000 mL via INTRAVENOUS
  Administered 2016-06-20 – 2016-06-21 (×2): via INTRAVENOUS
  Filled 2016-06-19 (×5): qty 1000

## 2016-06-19 MED ORDER — HYDROMORPHONE HCL 2 MG/ML IJ SOLN
1.0000 mg | INTRAMUSCULAR | Status: DC | PRN
  Administered 2016-06-19 – 2016-06-21 (×8): 1 mg via INTRAVENOUS
  Filled 2016-06-19 (×8): qty 1

## 2016-06-19 NOTE — Progress Notes (Signed)
Subjective: She is really anxious this a.m. She said she had more pain yesterday, and especially last night. Pain was diffuse over the abdomen. She took in over a liter by mouth and put out a liter from her ileostomy yesterday. On exam she continues to complain of pain and wants pain medicine before it gets bad.  Objective: Vital signs in last 24 hours: Temp:  [97.8 F (36.6 C)-98.2 F (36.8 C)] 97.8 F (36.6 C) (02/22 0545) Pulse Rate:  [63-109] 109 (02/22 0545) Resp:  [18-20] 18 (02/22 0545) BP: (121-146)/(74-95) 125/95 (02/22 0545) SpO2:  [93 %-98 %] 93 % (02/22 0545) Last BM Date: 06/18/16 1130 PO 1490 IV 410 urine recorded Nothing from the ileostomy recorded Afebrile, A little tachycardic this AM,  No labs Film at 1 AM reported some free air. Repeat at 3:30 AM:  1. Persistent diffuse dilatation of small bowel to the level of the ileostomy may represent an ileus versus low grade obstruction at the ostomy. Small amount of contrast noted in the distal small bowel. 2. No definite pneumoperitoneum. 3. No acute cardiopulmonary process. Intake/Output from previous day: 02/21 0701 - 02/22 0700 In: 2620 [P.O.:1130; I.V.:1490] Out: 410 [Urine:410] Intake/Output this shift: No intake/output data recorded.  General appearance: alert, cooperative and Very anxious and afraid. Resp: clear to auscultation bilaterally GI: Some distention, no peritonitis, positive bowel sounds, ongoing gas and clear fluid coming through the ileostomy.  Lab Results:   Recent Labs  06/17/16 0158 06/18/16 0635  WBC 25.1* 10.3  HGB 17.2* 15.5*  HCT 48.1* 45.6  PLT 312 254    BMET  Recent Labs  06/17/16 0158 06/18/16 0635  NA 135 137  K 3.9 4.1  CL 109 111  CO2 16* 20*  GLUCOSE 122* 80  BUN 13 12  CREATININE 0.74 0.78  CALCIUM 9.6 9.3   PT/INR No results for input(s): LABPROT, INR in the last 72 hours.   Recent Labs Lab 06/17/16 0158  AST 16  ALT 14  ALKPHOS 76  BILITOT 0.6   PROT 7.6  ALBUMIN 4.1     Lipase     Component Value Date/Time   LIPASE 21 06/17/2016 0158     Studies/Results: Dg Abd 2 Views  Result Date: 06/18/2016 CLINICAL DATA:  Recent small bowel obstruction EXAM: ABDOMEN - 2 VIEW COMPARISON:  CT abdomen and pelvis June 17, 2016 FINDINGS: Supine and upright images were obtained. There are multiple loops of borderline dilated small bowel with scattered air-fluid levels. There is a right side ostomy. No free air. Visualized lung bases are clear. IMPRESSION: Findings felt to represent a degree of persistent bowel obstruction. No evident free air. Electronically Signed   By: Lowella Grip III M.D.   On: 06/18/2016 12:48   Dg Abd Acute W/chest  Result Date: 06/19/2016 CLINICAL DATA:  76 year old female with small bowel obstruction and pneumoperitoneum. Follow-up radiograph. EXAM: DG ABDOMEN ACUTE W/ 1V CHEST COMPARISON:  Abdominal radiograph dated 06/19/2016 and CT dated 06/17/2016 FINDINGS: Left lung base subsegmental atelectasis. The lungs are otherwise clear. No pleural effusion or pneumothorax. Top-normal cardiac size. Multiple surgical clips noted in the left breast. Persistent dilatation of loops of small bowel measuring up to 3.5 cm in caliber. Oral contrast noted within the stomach and proximal loops of small bowel. The stomach is distended and contains the majority of the contrast. There is dilution of the oral contrast with fluid and air in the distal small bowel. There is a right lower quadrant ileostomy. There is  contrast in the small bowel above the ostomy likely within a distal loop. Findings may represent ileus versus a low grade obstruction at the ileostomy. No definite free air identified. There is degenerative changes of the spine.  No acute fracture. IMPRESSION: 1. Persistent diffuse dilatation of small bowel to the level of the ileostomy may represent an ileus versus low grade obstruction at the ostomy. Small amount of contrast noted  in the distal small bowel. 2. No definite pneumoperitoneum. 3. No acute cardiopulmonary process. Electronically Signed   By: Anner Crete M.D.   On: 06/19/2016 03:46   Dg Abd Portable 1v-small Bowel Obstruction Protocol-initial, 8 Hr Delay  Result Date: 06/19/2016 CLINICAL DATA:  Small bowel obstruction EXAM: PORTABLE ABDOMEN - 1 VIEW COMPARISON:  Abdominal radiograph 06/18/2016 FINDINGS: There is a large amount of free air within the abdomen. Multiple dilated loops of small bowel are again seen. There is enteric contrast within the proximal small bowel but no transit to the colon or distal small bowel. IMPRESSION: Large volume pneumoperitoneum. If this is inconsistent with the clinical exam, a lateral decubitus radiograph could be obtained for confirmation. Critical Value/emergent results were called by telephone at the time of interpretation on 06/19/2016 at 2:07 am to K. Baltazar Najjar who verbally acknowledged these results. Electronically Signed   By: Ulyses Jarred M.D.   On: 06/19/2016 02:09    Medications: . chlorhexidine  15 mL Mouth Rinse BID  . enoxaparin (LOVENOX) injection  40 mg Subcutaneous Q24H  . famotidine (PEPCID) IV  20 mg Intravenous Q12H  . FLUoxetine  20 mg Oral Daily  . mouth rinse  15 mL Mouth Rinse q12n4p  . predniSONE  20 mg Oral Q breakfast    Assessment/Plan SBO versus Crohn's flare.  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  History of Crohn's disease/ulcerative colitis - Dr. Hilarie Fredrickson, [facility] gastroenterology Dr. Emelda Fear, [facility]  History of horseshoe perirectal abscess, with incision and drainage/seton placement 01/22/16 Dr. Armandina Gemma.  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 Johnathan Hausen.  History of left breast cancer History of depression History of prior abdominal hysterectomy/appendectomy laparoscopic diverting colostomy Hx of anxiety and depression FEN:  IV fluids/NPO ID:  No  antibiotics DVT:  Lovenox    Plan: Right now it's unclear to me what his anxiety, and what his pain. She's had no nausea or vomiting, with a liter of PO yesterday. Despite the good output she still has small bowel dilatation on her film.   We are going to repeat her film later today. I'm going to put her back on ice chips for now.   Repeat films at 1300 hrs:  Small bowel dilatation with multiple air-fluid levels consistent with small bowel obstruction. No significant change from earlier today. Contrast in the stomach and small bowel from CT. No significant colonic contrast identified. Colon decompressed. No free air on the upright view.  Still has gas and liquid in ileostomy.    She needs to be NPO, NG placed and PO meds converted to IV meds.  Recheck films and labs in AM.  LOS: 2 days    JENNINGS,WILLARD 06/19/2016 [phone]  Agree with above. She is still having some ileostomy output, but has significant dilated small bowel on KUB with contrast still in stomach from yesterday. She is an anxious personality.  Will leave some Valium for placing the NGT.  Will need to adjust oral meds - will leave to medicine.  [name], MD, [facility]  Pager: [phone] Office phone:  [phone]

## 2016-06-19 NOTE — Progress Notes (Signed)
NP was called by Dr. Ulyses Jarred, radiologist, in relation to the results of the abd film post-gastrogaffin protocol. Abd film + for large pneumoperitoneum which was not present on previous films. NP came to see pt after reviewing chart. S: pt states her pain in past few hours has been worse and "about the same" as when she came to the ED. States earlier in the day, she felt well except for some mild "cramping". + nausea tonight, no vomiting. Had clear liquids for dinner. O: Well appearing WF in NAD. Aroused easily and is alert and oriented. Afebrile. BP 140s. HR 80s. RR normal. Abd is tense but not rigid or distended. BS hypoactive x 4 quadrants. Generalized tenderness with palpation. No rebound or guarding. No grimacing with exam.  A/P 1. Colitis/chrohn's flare vs. SBO-KUB + for large pneumoperitoneum, dilated loops of small bowel, contrast proximal in small bowel without transit to colon or distal small bowel. Per radiologist, if exam neg, get decub film and that has been ordered.  After exam, NP called Dr. Johney Maine, on call for general surgery. Case, VS, tests and exam discussed. Dr. Johney Maine to floor to see pt.  KJKG, NP [facility]

## 2016-06-19 NOTE — Progress Notes (Signed)
PROGRESS NOTE    [name]  JJO:[id number] DOB: 1940/06/03 DOA: 06/17/2016 PCP: Ann Held, DO   Brief Narrative: Sara Barnes is a 76 y.o.  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 Her abdominal pain has started to improve.    Assessment & Plan:   Active Problems:   Leucocytosis   Ulcerative pancolitis with abscess (HCC)   Ulcerative pancolitis with rectal bleeding (HCC)   Small bowel obstruction   Nausea and vomiting   Abdominal pain Concern for IBD flare initially but appears likely secondary to obstruction. Concern for free air on previous X-ray earlier this morning which appears unlikely on repeat scans. -general surgery recommendations: NG tube  Ulcerative colitis -Restart oral solumedrol IV and discontinue prednisone  Dehydration Improved secondary to IV fluids -keep IV fluids until tolerating oral hydration  Anxiety -continue fluoxetine  Leukocytosis Resolved.  Insomnia -continue Ambien   DVT prophylaxis: Lovenox Code Status: Full code Family Communication: None at bedside Disposition Plan: Discharge pending clinical improvement   Consultants:   Gastroenterology  General surgery  Procedures:   None  Antimicrobials:  None    Subjective: Worsened abdominal pain from yesterday that is located in the lower quadrant. No vomiting but has been nauseated.  Objective: Vitals:   06/18/16 2115 06/18/16 2300 06/19/16 0545 06/19/16 1345  BP: 130/75 (!) 146/83 (!) 125/95 (!) 149/71  Pulse: 67 83 (!) 109 88  Resp: 20  18 18   Temp: 97.8 F (36.6 C)  97.8 F (36.6 C) 99.5 F (37.5 C)  TempSrc: Oral  Oral Oral  SpO2: 97% 93% 93% 95%  Weight:      Height:        Intake/Output Summary (Last 24 hours) at 06/19/16 1527 Last data filed at 06/19/16 1345  Gross  per 24 hour  Intake             1520 ml  Output              600 ml  Net              920 ml   Filed Weights   06/17/16 0933  Weight: 63.5 kg (140 lb)    Examination:  General exam: Appears calm and comfortable Respiratory system: Clear to auscultation. Respiratory effort normal. Cardiovascular system: S1 & S2 heard, RRR. No murmurs. Gastrointestinal system: Abdomen is nondistended, soft and tender in lower quadrants. Decreased bowel sounds heard. Central nervous system: Alert and oriented. Difficulty hearing. Extremities: No edema. No calf tenderness Skin: No cyanosis. No rashes Psychiatry: Judgement and insight appear normal. Mood & affect slightly anxious and normal.     Data Reviewed: I have personally reviewed following labs and imaging studies  CBC:  Recent Labs Lab 06/17/16 0158 06/18/16 0635  WBC 25.1* 10.3  HGB 17.2* 15.5*  HCT 48.1* 45.6  MCV 87.3 90.7  PLT 312 160   Basic Metabolic Panel:  Recent Labs Lab 06/17/16 0158 06/18/16 0635  NA 135 137  K 3.9 4.1  CL 109 111  CO2 16* 20*  GLUCOSE 122* 80  BUN 13 12  CREATININE 0.74 0.78  CALCIUM 9.6 9.3   GFR: Estimated Creatinine Clearance: 57.8 mL/min (by C-G formula based on SCr of 0.78 mg/dL). Liver Function Tests:  Recent Labs Lab 06/17/16 0158  AST 16  ALT 14  ALKPHOS 76  BILITOT 0.6  PROT 7.6  ALBUMIN 4.1    Recent Labs Lab 06/17/16 0158  LIPASE 21   No results for input(s): AMMONIA in the last 168 hours. Coagulation Profile: No results for input(s): INR, PROTIME in the last 168 hours. Cardiac Enzymes: No results for input(s): CKTOTAL, CKMB, CKMBINDEX, TROPONINI in the last 168 hours. BNP (last 3 results) No results for input(s): PROBNP in the last 8760 hours. HbA1C: No results for input(s): HGBA1C in the last 72 hours. CBG: No results for input(s): GLUCAP in the last 168 hours. Lipid Profile: No results for input(s): CHOL, HDL, LDLCALC, TRIG, CHOLHDL, LDLDIRECT in the last  72 hours. Thyroid Function Tests: No results for input(s): TSH, T4TOTAL, FREET4, T3FREE, THYROIDAB in the last 72 hours. Anemia Panel: No results for input(s): VITAMINB12, FOLATE, FERRITIN, TIBC, IRON, RETICCTPCT in the last 72 hours. Sepsis Labs: No results for input(s): PROCALCITON, LATICACIDVEN in the last 168 hours.  Recent Results (from the past 240 hour(s))  Urine culture     Status: Abnormal   Collection Time: 06/17/16  6:15 AM  Result Value Ref Range Status   Specimen Description URINE, CLEAN CATCH  Final   Special Requests NONE  Final   Culture (A)  Final    <10,000 COLONIES/mL INSIGNIFICANT GROWTH Performed at [facility], [address] [place], [place] [address]    Report Status 06/18/2016 FINAL  Final  Culture, blood (Routine X 2) w Reflex to ID Panel     Status: None (Preliminary result)   Collection Time: 06/17/16  9:52 AM  Result Value Ref Range Status   Specimen Description BLOOD RIGHT ARM  Final   Special Requests BOTTLES DRAWN AEROBIC AND ANAEROBIC 5CC  Final   Culture   Final    NO GROWTH < 24 HOURS Performed at [facility], [place] [address], [place], [place] [address]    Report Status PENDING  Incomplete  Culture, blood (Routine X 2) w Reflex to ID Panel     Status: None (Preliminary result)   Collection Time: 06/17/16  9:58 AM  Result Value Ref Range Status   Specimen Description BLOOD RIGHT HAND  Final   Special Requests IN PEDIATRIC BOTTLE 3CC  Final   Culture   Final    NO GROWTH < 24 HOURS Performed at [facility], [place] [address] [place], [place] [address]    Report Status PENDING  Incomplete         Radiology Studies: Dg Abd 2 Views  Result Date: 06/19/2016 CLINICAL DATA:  Small-bowel obstruction.  Ulcerative colitis EXAM: ABDOMEN - 2 VIEW COMPARISON:  06/19/2016 FINDINGS: Small bowel dilatation with multiple air-fluid levels consistent with small bowel obstruction. No significant change from earlier today. Contrast in  the stomach and small bowel from CT. No significant colonic contrast identified. Colon decompressed. No free air on the upright view. IMPRESSION: Small-bowel obstruction pattern unchanged. Contrast does not reach the colon. No free air. Electronically Signed   By: Franchot Gallo M.D.   On: 06/19/2016 13:31   Dg Abd 2 Views  Result Date: 06/18/2016 CLINICAL DATA:  Recent small bowel obstruction EXAM: ABDOMEN - 2 VIEW COMPARISON:  CT abdomen and pelvis June 17, 2016 FINDINGS: Supine and upright images were obtained. There are multiple loops of borderline dilated small bowel with scattered air-fluid levels. There is a right side ostomy. No free air. Visualized lung bases are clear. IMPRESSION: Findings felt to represent a degree of persistent bowel obstruction. No  evident free air. Electronically Signed   By: Lowella Grip III M.D.   On: 06/18/2016 12:48   Dg Abd Acute W/chest  Result Date: 06/19/2016 CLINICAL DATA:  76 year old female with small bowel obstruction and pneumoperitoneum. Follow-up radiograph. EXAM: DG ABDOMEN ACUTE W/ 1V CHEST COMPARISON:  Abdominal radiograph dated 06/19/2016 and CT dated 06/17/2016 FINDINGS: Left lung base subsegmental atelectasis. The lungs are otherwise clear. No pleural effusion or pneumothorax. Top-normal cardiac size. Multiple surgical clips noted in the left breast. Persistent dilatation of loops of small bowel measuring up to 3.5 cm in caliber. Oral contrast noted within the stomach and proximal loops of small bowel. The stomach is distended and contains the majority of the contrast. There is dilution of the oral contrast with fluid and air in the distal small bowel. There is a right lower quadrant ileostomy. There is contrast in the small bowel above the ostomy likely within a distal loop. Findings may represent ileus versus a low grade obstruction at the ileostomy. No definite free air identified. There is degenerative changes of the spine.  No acute fracture.  IMPRESSION: 1. Persistent diffuse dilatation of small bowel to the level of the ileostomy may represent an ileus versus low grade obstruction at the ostomy. Small amount of contrast noted in the distal small bowel. 2. No definite pneumoperitoneum. 3. No acute cardiopulmonary process. Electronically Signed   By: Anner Crete M.D.   On: 06/19/2016 03:46   Dg Abd Portable 1v-small Bowel Obstruction Protocol-initial, 8 Hr Delay  Result Date: 06/19/2016 CLINICAL DATA:  Small bowel obstruction EXAM: PORTABLE ABDOMEN - 1 VIEW COMPARISON:  Abdominal radiograph 06/18/2016 FINDINGS: There is a large amount of free air within the abdomen. Multiple dilated loops of small bowel are again seen. There is enteric contrast within the proximal small bowel but no transit to the colon or distal small bowel. IMPRESSION: Large volume pneumoperitoneum. If this is inconsistent with the clinical exam, a lateral decubitus radiograph could be obtained for confirmation. Critical Value/emergent results were called by telephone at the time of interpretation on 06/19/2016 at 2:07 am to K. Baltazar Najjar who verbally acknowledged these results. Electronically Signed   By: Ulyses Jarred M.D.   On: 06/19/2016 02:09        Scheduled Meds: . chlorhexidine  15 mL Mouth Rinse BID  . diazepam  5 mg Intravenous Once  . enoxaparin (LOVENOX) injection  40 mg Subcutaneous Q24H  . famotidine (PEPCID) IV  20 mg Intravenous Q12H  . FLUoxetine  20 mg Oral Daily  . mouth rinse  15 mL Mouth Rinse q12n4p  . predniSONE  20 mg Oral Q breakfast   Continuous Infusions: . 0.45 % NaCl with KCl 20 mEq / L       LOS: 2 days     Cordelia Poche [facility] 06/19/2016, 3:27 PM Pager: [phone]  If 7PM-7AM, please contact night-coverage [email or web address] Password [facility] 06/19/2016, 3:27 PM

## 2016-06-19 NOTE — Progress Notes (Addendum)
[facility]  [address] [place], [place] [address] Phone: [phone] FAX: [place] [phone] 01/04/1941    Problem List:   Active Problems:   Leucocytosis   Ulcerative pancolitis with abscess [facility])   Ulcerative pancolitis with rectal bleeding (HCC)   Small bowel obstruction   Nausea and vomiting           Assessment  SBO Seems to be resolving clinically with gas and effluent in bag  ?Pneumoperitoneum but no evidence of peritonitis  Plan:  -Even the radiologist wonders if decubitus film would be of help sort this out.  I requested a three-way film to more formally evaluate this.    Ideally would have nasogastric tube decompression.  She refuses.  IVF  If pneumoperitoneum strongly suspected, may get CT scan to clarify.  May require abdominal exploration although it would be odd for perforation given the fact her disease is distal to the ileostomydiversion.  Suspect this is an over call on Xray but need to be safe  -VTE prophylaxis- SCDs, etc -mobilize as tolerated to help recovery  Adin Hector, M.D., F.A.C.S. Gastrointestinal and Minimally Invasive Surgery [facility] [address], [place], [place] [address] [phone] Main / Paging   06/19/2016  CARE TEAM:  PCP: Ann Held, DO  Outpatient Care Team: Patient Care Team: Ann Held, DO as PCP - General [name], MD as Consulting Physician (General Surgery) Truitt Merle, MD as Consulting Physician (Hematology) Thea Silversmith, MD (Inactive) as Consulting Physician (Radiation Oncology) Mauro Kaufmann, RN as Registered Nurse Rockwell Germany, RN as Registered Nurse Holley Bouche, NP as Nurse Practitioner (Nurse Practitioner) Sylvan Cheese, NP as Nurse Practitioner (Nurse Practitioner) Jerene Bears, MD as Consulting Physician (Gastroenterology)  Inpatient Treatment  Team: Treatment Team: Attending Provider: Mariel Aloe, MD; Consulting Physician: Nolon Nations, MD; Technician: Colonel Bald, NT; Technician: Etheleen Sia, NT; Rounding Team: Redmond Baseman, MD; Registered Nurse: Mortimer Fries, RN  Subjective:  23 her x-ray films showing possible pneumoperitoneum.  Admitting service paged.  They paged [place].  Patient resting in room, sleeping.  She notes she has had some crampy abdominal pain intermittently throughout the day with some nausea.  However she feels not too bad right now.  She has been sleeping fine.  Objective:  Vital signs:  Vitals:   06/18/16 0600 06/18/16 1428 06/18/16 2115 06/18/16 2300  BP: 136/72 121/74 130/75 (!) 146/83  Pulse: 63 63 67 83  Resp: 18 18 20    Temp: 97.7 F (36.5 C) 98.2 F (36.8 C) 97.8 F (36.6 C)   TempSrc: Oral Oral Oral   SpO2: 100% 98% 97% 93%  Weight:      Height:        Last BM Date: 06/18/16  Intake/Output   Yesterday:  02/21 0701 - 02/22 0700 In: 2620 [P.O.:1130; I.V.:1490] Out: 410 [Urine:410] This shift:  Total I/O In: 600 [I.V.:600] Out: -   Bowel function:  Flatus: YES in bag  BM:  YES thin succus in bag  Drain: (No drain)   Physical Exam:  General: Pt awakens/alert/oriented x4 in No acute distress.  Very talkative but pleasant Eyes: PERRL, normal EOM.  Sclera clear.  No icterus Neuro: CN II-XII intact w/o focal sensory/motor deficits. Lymph: No head/neck/groin lymphadenopathy Psych:  No delerium/psychosis/paranoia HENT: Normocephalic, Mucus membranes moist.  No thrush. HOH Neck: Supple, No tracheal deviation Chest:  No chest wall pain w good excursion CV:  Pulses intact.  Regular rhythm MS: Normal AROM mjr joints.  No obvious deformity Abdomen: Somewhat firm.  Moderately distended.  Nontender.  No evidence of peritonitis.  No incarcerated hernias. Ext:  SCDs BLE.  No mjr edema.  No cyanosis Skin: No petechiae / purpura  Results:   Labs: Results for orders placed  or performed during the hospital encounter of 06/17/16 (from the past 48 hour(s))  Urinalysis, Routine w reflex microscopic     Status: Abnormal   Collection Time: 06/17/16  6:15 AM  Result Value Ref Range   Color, Urine YELLOW YELLOW   APPearance CLEAR CLEAR   Specific Gravity, Urine 1.016 1.005 - 1.030   pH 5.0 5.0 - 8.0   Glucose, UA NEGATIVE NEGATIVE mg/dL   Hgb urine dipstick SMALL (A) NEGATIVE   Bilirubin Urine NEGATIVE NEGATIVE   Ketones, ur NEGATIVE NEGATIVE mg/dL   Protein, ur NEGATIVE NEGATIVE mg/dL   Nitrite NEGATIVE NEGATIVE   Leukocytes, UA NEGATIVE NEGATIVE   RBC / HPF 0-5 0 - 5 RBC/hpf   WBC, UA 6-30 0 - 5 WBC/hpf   Bacteria, UA RARE (A) NONE SEEN   Squamous Epithelial / LPF 0-5 (A) NONE SEEN   Mucous PRESENT    Hyaline Casts, UA PRESENT   Urine culture     Status: Abnormal   Collection Time: 06/17/16  6:15 AM  Result Value Ref Range   Specimen Description URINE, CLEAN CATCH    Special Requests NONE    Culture (A)     <10,000 COLONIES/mL INSIGNIFICANT GROWTH Performed at [facility], [address], [place], [place] [address]    Report Status 06/18/2016 FINAL   Culture, blood (Routine X 2) w Reflex to ID Panel     Status: None (Preliminary result)   Collection Time: 06/17/16  9:52 AM  Result Value Ref Range   Specimen Description BLOOD RIGHT ARM    Special Requests BOTTLES DRAWN AEROBIC AND ANAEROBIC 5CC    Culture      NO GROWTH < 24 HOURS Performed at [facility], [place] [address] [place], [place] [address]    Report Status PENDING   Culture, blood (Routine X 2) w Reflex to ID Panel     Status: None (Preliminary result)   Collection Time: 06/17/16  9:58 AM  Result Value Ref Range   Specimen Description BLOOD RIGHT HAND    Special Requests IN PEDIATRIC BOTTLE 3CC    Culture      NO GROWTH < 24 HOURS Performed at [facility], [place] [address] [place], [place] [address]    Report Status PENDING   Basic metabolic panel     Status:  Abnormal   Collection Time: 06/18/16  6:35 AM  Result Value Ref Range   Sodium 137 135 - 145 mmol/L   Potassium 4.1 3.5 - 5.1 mmol/L   Chloride 111 101 - 111 mmol/L   CO2 20 (L) 22 - 32 mmol/L   Glucose, Bld 80 65 - 99 mg/dL   BUN 12 6 - 20 mg/dL   Creatinine, Ser 0.78 0.44 - 1.00 mg/dL   Calcium 9.3 8.9 - 10.3 mg/dL   GFR calc non Af Amer >60 >60 mL/min   GFR calc Af Amer >60 >60 mL/min    Comment: (NOTE) The eGFR has been calculated using the CKD EPI equation. This calculation has not been validated in all clinical situations. eGFR's persistently <60 mL/min  signify possible Chronic Kidney Disease.    Anion gap 6 5 - 15  CBC     Status: Abnormal   Collection Time: 06/18/16  6:35 AM  Result Value Ref Range   WBC 10.3 4.0 - 10.5 K/uL   RBC 5.03 3.87 - 5.11 MIL/uL   Hemoglobin 15.5 (H) 12.0 - 15.0 g/dL   HCT 45.6 36.0 - 46.0 %   MCV 90.7 78.0 - 100.0 fL   MCH 30.8 26.0 - 34.0 pg   MCHC 34.0 30.0 - 36.0 g/dL   RDW 15.6 (H) 11.5 - 15.5 %   Platelets 254 150 - 400 K/uL    Imaging / Studies: Ct Abdomen Pelvis W Contrast  Result Date: 06/17/2016 CLINICAL DATA:  76 year old female with lower abdominal pain. 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 EXAM: CT ABDOMEN AND PELVIS WITH CONTRAST TECHNIQUE: Multidetector CT imaging of the abdomen and pelvis was performed using the standard protocol following bolus administration of intravenous contrast. CONTRAST:  100 cc Isovue-300 COMPARISON:  Abdominal radiograph dated 02/23/2016 and CT dated 01/21/2016 FINDINGS: Lower chest: Minimal bibasilar dependent atelectatic changes of the lungs. The visualized lung bases are otherwise clear. No intra-abdominal free air. Diffuse mesenteric edema and small free fluid. Hepatobiliary: No focal liver abnormality is seen. No gallstones, gallbladder wall thickening, or biliary dilatation. Pancreas: Unremarkable. No pancreatic ductal dilatation or surrounding  inflammatory changes. Spleen: Normal in size without focal abnormality. Adrenals/Urinary Tract: Adrenal glands are unremarkable. Kidneys are normal, without renal calculi, focal lesion, or hydronephrosis. Bladder is unremarkable. Stomach/Bowel: There is postsurgical changes of resection of the terminal ileum with the right lower quadrant diverting ileostomy. There is mild diffuse prominence of fluid-filled loops of mid to distal small bowel extending to the level of the ileostomy compatible with low grade or early obstruction. The transition zone is at the ileostomy likely related to underlying adhesion or stricture. The colon is collapsed. Diffuse thickened appearance of the colon likely related to underdistention. Vascular/Lymphatic: There is mild aortoiliac atherosclerotic disease. The origins of the celiac axis, SMA, IMA as well as the origins of the renal arteries are patent. Nonocclusive scar noted at the origin of the SMA. The IVC is unremarkable. The SMV, splenic vein, and main portal vein are patent. No portal venous gas identified. There is no adenopathy. Reproductive: Hysterectomy. Other: Bilateral breast implants. Musculoskeletal: Degenerative changes of the spine. No acute fracture. IMPRESSION: 1. Postsurgical changes of bowel resection at the level of the terminal with a right lower quadrant ileostomy. There is mild dilatation of the small bowel extending to the ileostomy compatible with an early or low grade obstruction likely related to adhesion or stricture at the ileostomy. Clinical correlation and follow-up recommended. 2. Diffusely thickened colon likely related to underdistention. 3. Diffuse mesenteric edema and small ascites. Electronically Signed   By: Anner Crete M.D.   On: 06/17/2016 07:17   Dg Abd 2 Views  Result Date: 06/18/2016 CLINICAL DATA:  Recent small bowel obstruction EXAM: ABDOMEN - 2 VIEW COMPARISON:  CT abdomen and pelvis June 17, 2016 FINDINGS: Supine and upright  images were obtained. There are multiple loops of borderline dilated small bowel with scattered air-fluid levels. There is a right side ostomy. No free air. Visualized lung bases are clear. IMPRESSION: Findings felt to represent a degree of persistent bowel obstruction. No evident free air. Electronically Signed   By: Lowella Grip III M.D.   On: 06/18/2016 12:48   Dg Abd Portable 1v-small  Bowel Obstruction Protocol-initial, 8 Hr Delay  Result Date: 06/19/2016 CLINICAL DATA:  Small bowel obstruction EXAM: PORTABLE ABDOMEN - 1 VIEW COMPARISON:  Abdominal radiograph 06/18/2016 FINDINGS: There is a large amount of free air within the abdomen. Multiple dilated loops of small bowel are again seen. There is enteric contrast within the proximal small bowel but no transit to the colon or distal small bowel. IMPRESSION: Large volume pneumoperitoneum. If this is inconsistent with the clinical exam, a lateral decubitus radiograph could be obtained for confirmation. Critical Value/emergent results were called by telephone at the time of interpretation on 06/19/2016 at 2:07 am to K. Baltazar Najjar who verbally acknowledged these results. Electronically Signed   By: Ulyses Jarred M.D.   On: 06/19/2016 02:09    Medications / Allergies: per chart  Antibiotics: Anti-infectives    None        Note: Portions of this report may have been transcribed using voice recognition software. Every effort was made to ensure accuracy; however, inadvertent computerized transcription errors may be present.   Any transcriptional errors that result from this process are unintentional.     Adin Hector, M.D., F.A.C.S. Gastrointestinal and Minimally Invasive Surgery [facility] [address] [place], [place] [address] [phone] Main / Paging   06/19/2016

## 2016-06-19 NOTE — Progress Notes (Signed)
Pt's NG tube was x-rayed for placement. Provider on call gave ok to attach NG to suction.  After I did so, 700 cc green, thin fluid was suctioned out.  Will continue to monitor.

## 2016-06-20 ENCOUNTER — Inpatient Hospital Stay (HOSPITAL_COMMUNITY): Payer: Medicare HMO

## 2016-06-20 LAB — COMPREHENSIVE METABOLIC PANEL
ALK PHOS: 52 U/L (ref 38–126)
ALT: 12 U/L — ABNORMAL LOW (ref 14–54)
ANION GAP: 8 (ref 5–15)
AST: 14 U/L — ABNORMAL LOW (ref 15–41)
Albumin: 3.6 g/dL (ref 3.5–5.0)
BILIRUBIN TOTAL: 1.1 mg/dL (ref 0.3–1.2)
BUN: 22 mg/dL — ABNORMAL HIGH (ref 6–20)
CALCIUM: 9.1 mg/dL (ref 8.9–10.3)
CO2: 18 mmol/L — ABNORMAL LOW (ref 22–32)
Chloride: 112 mmol/L — ABNORMAL HIGH (ref 101–111)
Creatinine, Ser: 0.92 mg/dL (ref 0.44–1.00)
GFR calc non Af Amer: 60 mL/min — ABNORMAL LOW (ref >60)
GLUCOSE: 80 mg/dL (ref 65–99)
Potassium: 4.4 mmol/L (ref 3.5–5.1)
Sodium: 138 mmol/L (ref 135–145)
TOTAL PROTEIN: 6.6 g/dL (ref 6.5–8.1)

## 2016-06-20 LAB — CBC
HEMATOCRIT: 44.7 % (ref 36.0–46.0)
HEMOGLOBIN: 14.5 g/dL (ref 12.0–15.0)
MCH: 30.1 pg (ref 26.0–34.0)
MCHC: 32.4 g/dL (ref 30.0–36.0)
MCV: 92.9 fL (ref 78.0–100.0)
Platelets: 255 10*3/uL (ref 150–400)
RBC: 4.81 MIL/uL (ref 3.87–5.11)
RDW: 15.6 % — ABNORMAL HIGH (ref 11.5–15.5)
WBC: 15.5 10*3/uL — ABNORMAL HIGH (ref 4.0–10.5)

## 2016-06-20 MED ORDER — PHENOL 1.4 % MT LIQD
1.0000 | OROMUCOSAL | Status: DC | PRN
  Filled 2016-06-20: qty 177

## 2016-06-20 NOTE — Progress Notes (Signed)
Subjective: She actually looks much better this a.m. complaining about discomfort from her nose. NG is out some from the original insertion but seems to be draining. Not draining very much now. She still complains of some pain but it is minimal. She feels tight but not overly distended.  Objective: Vital signs in last 24 hours: Temp:  [97.9 F (36.6 C)-99.5 F (37.5 C)] 98 F (36.7 C) (02/23 0454) Pulse Rate:  [88-95] 95 (02/23 0454) Resp:  [15-18] 15 (02/23 0454) BP: (138-149)/(71-86) 142/84 (02/23 0454) SpO2:  [95 %-97 %] 97 % (02/23 0454) Last BM Date: 06/20/16 NPO 2100 IV Urine 200 recorded NG 500 & 700 recorded different places 1675 from ileostomy Afebrile, VSS CMP OK WBC is up to 15.5 Intake/Output from previous day: 02/22 0701 - 02/23 0700 In: 2160.4 [I.V.:1960.4; IV Piggyback:200] Out: 2375 [Urine:200; Emesis/NG output:500; ATFTD:3220] Intake/Output this shift: Total I/O In: 500 [I.V.:500] Out: 530 [Stool:530]  General appearance: alert, cooperative and no distress Resp: clear to auscultation bilaterally GI: Her abdomen feels tight is not really soft. She has bowel sounds, good ileostomy output some discomfort on palpation she is still taking some pain medicine but minimal.  Lab Results:   Recent Labs  06/18/16 0635 06/20/16 0416  WBC 10.3 15.5*  HGB 15.5* 14.5  HCT 45.6 44.7  PLT 254 255    BMET  Recent Labs  06/18/16 0635 06/20/16 0416  NA 137 138  K 4.1 4.4  CL 111 112*  CO2 20* 18*  GLUCOSE 80 80  BUN 12 22*  CREATININE 0.78 0.92  CALCIUM 9.3 9.1   PT/INR No results for input(s): LABPROT, INR in the last 72 hours.   Recent Labs Lab 06/17/16 0158 06/20/16 0416  AST 16 14*  ALT 14 12*  ALKPHOS 76 52  BILITOT 0.6 1.1  PROT 7.6 6.6  ALBUMIN 4.1 3.6     Lipase     Component Value Date/Time   LIPASE 21 06/17/2016 0158     Studies/Results: Dg Abd 2 Views  Result Date: 06/20/2016 CLINICAL DATA:  Small bowel obstruction.  EXAM: ABDOMEN - 2 VIEW COMPARISON:  06/19/2016 FINDINGS: Enteric tube terminates in the proximal stomach with side hole near the GE junction. There is no evidence of intraperitoneal free air. Small bowel dilatation has overall improved from the prior study, however a few mildly dilated loops of gas-filled bowel remain in the predominantly left half of the abdomen. An ostomy is noted in the right lower quadrant. Mild left basilar airspace opacity has mildly improved. IMPRESSION: 1. Decreasing small bowel dilatation suggestive of improving obstruction. 2. Mildly improved left lung base aeration. Electronically Signed   By: Logan Bores M.D.   On: 06/20/2016 08:38   Dg Abd 2 Views  Result Date: 06/19/2016 CLINICAL DATA:  Small-bowel obstruction.  Ulcerative colitis EXAM: ABDOMEN - 2 VIEW COMPARISON:  06/19/2016 FINDINGS: Small bowel dilatation with multiple air-fluid levels consistent with small bowel obstruction. No significant change from earlier today. Contrast in the stomach and small bowel from CT. No significant colonic contrast identified. Colon decompressed. No free air on the upright view. IMPRESSION: Small-bowel obstruction pattern unchanged. Contrast does not reach the colon. No free air. Electronically Signed   By: Franchot Gallo M.D.   On: 06/19/2016 13:31   Dg Abd 2 Views  Result Date: 06/18/2016 CLINICAL DATA:  Recent small bowel obstruction EXAM: ABDOMEN - 2 VIEW COMPARISON:  CT abdomen and pelvis June 17, 2016 FINDINGS: Supine and upright images were obtained. There  are multiple loops of borderline dilated small bowel with scattered air-fluid levels. There is a right side ostomy. No free air. Visualized lung bases are clear. IMPRESSION: Findings felt to represent a degree of persistent bowel obstruction. No evident free air. Electronically Signed   By: Lowella Grip III M.D.   On: 06/18/2016 12:48   Dg Abd Acute W/chest  Result Date: 06/19/2016 CLINICAL DATA:  76 year old female  with small bowel obstruction and pneumoperitoneum. Follow-up radiograph. EXAM: DG ABDOMEN ACUTE W/ 1V CHEST COMPARISON:  Abdominal radiograph dated 06/19/2016 and CT dated 06/17/2016 FINDINGS: Left lung base subsegmental atelectasis. The lungs are otherwise clear. No pleural effusion or pneumothorax. Top-normal cardiac size. Multiple surgical clips noted in the left breast. Persistent dilatation of loops of small bowel measuring up to 3.5 cm in caliber. Oral contrast noted within the stomach and proximal loops of small bowel. The stomach is distended and contains the majority of the contrast. There is dilution of the oral contrast with fluid and air in the distal small bowel. There is a right lower quadrant ileostomy. There is contrast in the small bowel above the ostomy likely within a distal loop. Findings may represent ileus versus a low grade obstruction at the ileostomy. No definite free air identified. There is degenerative changes of the spine.  No acute fracture. IMPRESSION: 1. Persistent diffuse dilatation of small bowel to the level of the ileostomy may represent an ileus versus low grade obstruction at the ostomy. Small amount of contrast noted in the distal small bowel. 2. No definite pneumoperitoneum. 3. No acute cardiopulmonary process. Electronically Signed   By: Anner Crete M.D.   On: 06/19/2016 03:46   Dg Abd Portable 1v  Result Date: 06/19/2016 CLINICAL DATA:  Nasogastric tube placement.  Initial encounter. EXAM: PORTABLE ABDOMEN - 1 VIEW COMPARISON:  Abdominal radiograph performed earlier today at 1:04 p.m. FINDINGS: The patient's enteric tube is noted ending overlying the body of the stomach. The stomach is partially filled with air. Distended small bowel loops are similar in appearance to the prior study, concerning for small bowel obstruction. No free intra-abdominal air is seen, though evaluation for free air is limited on a single supine view. No acute osseous abnormalities are  identified. Mild left basilar airspace opacity raises question for mild pneumonia. IMPRESSION: 1. Enteric tube noted ending overlying the body of the stomach. 2. Distended small bowel loops again noted, concerning for small obstruction. 3. Mild left basilar airspace opacity raises question for mild pneumonia, new from the prior study. Electronically Signed   By: Garald Balding M.D.   On: 06/19/2016 19:21   Dg Abd Portable 1v-small Bowel Obstruction Protocol-initial, 8 Hr Delay  Result Date: 06/19/2016 CLINICAL DATA:  Small bowel obstruction EXAM: PORTABLE ABDOMEN - 1 VIEW COMPARISON:  Abdominal radiograph 06/18/2016 FINDINGS: There is a large amount of free air within the abdomen. Multiple dilated loops of small bowel are again seen. There is enteric contrast within the proximal small bowel but no transit to the colon or distal small bowel. IMPRESSION: Large volume pneumoperitoneum. If this is inconsistent with the clinical exam, a lateral decubitus radiograph could be obtained for confirmation. Critical Value/emergent results were called by telephone at the time of interpretation on 06/19/2016 at 2:07 am to K. Baltazar Najjar who verbally acknowledged these results. Electronically Signed   By: Ulyses Jarred M.D.   On: 06/19/2016 02:09    Medications: . chlorhexidine  15 mL Mouth Rinse BID  . enoxaparin (LOVENOX) injection  40 mg Subcutaneous  Q24H  . famotidine (PEPCID) IV  20 mg Intravenous Q12H  . FLUoxetine  20 mg Oral Daily  . lidocaine  15 mL Mouth/Throat Once  . lidocaine-EPINEPHrine-tetracaine  3 mL Topical Once  . mouth rinse  15 mL Mouth Rinse q12n4p  . methylPREDNISolone (SOLU-MEDROL) injection  40 mg Intravenous Daily   . 0.45 % NaCl with KCl 20 mEq / L 125 mL/hr at 06/20/16 1000     Assessment/Plan SBO versus Crohn's flare.             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             History of Crohn's disease/ulcerative colitis - Dr. Hilarie Fredrickson,  [facility]  gastroenterology Dr. Emelda Fear, [facility].             History of horseshoe perirectal abscess, with incision and  drainage/seton placement 01/22/16 Dr. Armandina Gemma.             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 Johnathan Hausen.  History of left breast cancer History of depression History of prior abdominal hysterectomy/appendectomy laparoscopic diverting colostomy Hx of anxiety and depression FEN: IV fluids/NPO ID: No antibiotics DVT: Lovenox    Plan: Clamping trials today. Her films are much improved from yesterday. She would like to get the NG out.   She is also very worried about getting her Prozac.   Plan clamping trials with the NG today, repeat films in a.m., Prozac while her NG is clamped.    LOS: 3 days    JENNINGS,WILLARD 06/20/2016 [phone]  Agree with above. Clearly better today.  Ileostomy still putting out.  She feels constrained because of NGT and IV in right arm. She is very anxious.  She has ativan ordered.  She has not ambulated today, but is going to get out and walk when her sister comes.  [name], MD, [facility] Pager: [phone] Office phone:  [phone]

## 2016-06-20 NOTE — Progress Notes (Signed)
PROGRESS NOTE    Sara Barnes  IOX:[id number] DOB: 24-Jan-1941 DOA: 06/17/2016 PCP: Ann Held, DO   Brief Narrative: Sara Barnes is a 76 y.o.  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 Her abdominal pain has started to improve.   Assessment & Plan:   Active Problems:   Leucocytosis   Ulcerative pancolitis with abscess (HCC)   Ulcerative pancolitis with rectal bleeding (HCC)   Small bowel obstruction   Nausea and vomiting   Abdominal pain Small bowel obstruction Concern for IBD flare initially but appears likely secondary to obstruction. NG tube output has decreased significantly from last night. -general surgery recommendations: NG tube  Ulcerative colitis -continue solumedrol IV and discontinue prednisone  Dehydration Improved secondary to IV fluids -keep IV fluids until tolerating oral hydration  Anxiety -continue fluoxetine  Leukocytosis Resolved.  Insomnia -continue Ambien   DVT prophylaxis: Lovenox Code Status: Full code Family Communication: None at bedside Disposition Plan: Discharge pending clinical improvement   Consultants:   Gastroenterology  General surgery  Procedures:   None  Antimicrobials:  None    Subjective: Abdominal pain is improved. More output from ostomy. Bag filling with gas.  Objective: Vitals:   06/19/16 1345 06/19/16 2043 06/20/16 0454 06/20/16 1400  BP: (!) 149/71 138/86 (!) 142/84 (!) 131/59  Pulse: 88 95 95 86  Resp: 18 16 15 17   Temp: 99.5 F (37.5 C) 97.9 F (36.6 C) 98 F (36.7 C) 98.2 F (36.8 C)  TempSrc: Oral Oral Oral Oral  SpO2: 95% 95% 97% 96%  Weight:      Height:        Intake/Output Summary (Last 24 hours) at 06/20/16 1525 Last data filed at 06/20/16 1421  Gross per 24 hour  Intake          2135.42 ml    Output             2585 ml  Net          -449.58 ml   Filed Weights   06/17/16 0933  Weight: 63.5 kg (140 lb)    Examination:  General exam: Appears calm and comfortable Respiratory system: Clear to auscultation. Respiratory effort normal. Cardiovascular system: S1 & S2 heard, RRR. No murmurs. Gastrointestinal system: Abdomen is nondistended, soft and tender in lower quadrants. Decreased bowel sounds heard. Central nervous system: Alert and oriented. Difficulty hearing. Extremities: No edema. No calf tenderness Skin: No cyanosis. No rashes Psychiatry: Judgement and insight appear normal. Mood & affect slightly anxious and normal.     Data Reviewed: I have personally reviewed following labs and imaging studies  CBC:  Recent Labs Lab 06/17/16 0158 06/18/16 0635 06/20/16 0416  WBC 25.1* 10.3 15.5*  HGB 17.2* 15.5* 14.5  HCT 48.1* 45.6 44.7  MCV 87.3 90.7 92.9  PLT 312 254 268   Basic Metabolic Panel:  Recent Labs Lab 06/17/16 0158 06/18/16 0635 06/20/16 0416  NA 135 137 138  K 3.9 4.1 4.4  CL 109 111 112*  CO2 16* 20* 18*  GLUCOSE 122* 80 80  BUN 13 12 22*  CREATININE 0.74 0.78 0.92  CALCIUM 9.6 9.3 9.1   GFR: Estimated Creatinine Clearance: 50.2 mL/min (by C-G formula based on SCr of 0.92 mg/dL). Liver Function Tests:  Recent Labs Lab 06/17/16 0158 06/20/16 0416  AST 16 14*  ALT 14 12*  ALKPHOS 76 52  BILITOT 0.6 1.1  PROT 7.6 6.6  ALBUMIN 4.1 3.6    Recent Labs Lab 06/17/16 0158  LIPASE 21   No results for input(s): AMMONIA in the last 168 hours. Coagulation Profile: No results for input(s): INR, PROTIME in the last 168 hours. Cardiac Enzymes: No results for input(s): CKTOTAL, CKMB, CKMBINDEX, TROPONINI in the last 168 hours. BNP (last 3 results) No results for input(s): PROBNP in the last 8760 hours. HbA1C: No results for input(s): HGBA1C in the last 72 hours. CBG: No results for input(s): GLUCAP in the last 168 hours. Lipid  Profile: No results for input(s): CHOL, HDL, LDLCALC, TRIG, CHOLHDL, LDLDIRECT in the last 72 hours. Thyroid Function Tests: No results for input(s): TSH, T4TOTAL, FREET4, T3FREE, THYROIDAB in the last 72 hours. Anemia Panel: No results for input(s): VITAMINB12, FOLATE, FERRITIN, TIBC, IRON, RETICCTPCT in the last 72 hours. Sepsis Labs: No results for input(s): PROCALCITON, LATICACIDVEN in the last 168 hours.  Recent Results (from the past 240 hour(s))  Urine culture     Status: Abnormal   Collection Time: 06/17/16  6:15 AM  Result Value Ref Range Status   Specimen Description URINE, CLEAN CATCH  Final   Special Requests NONE  Final   Culture (A)  Final    <10,000 COLONIES/mL INSIGNIFICANT GROWTH Performed at [facility], [address] [place], [place] [address]    Report Status 06/18/2016 FINAL  Final  Culture, blood (Routine X 2) w Reflex to ID Panel     Status: None (Preliminary result)   Collection Time: 06/17/16  9:52 AM  Result Value Ref Range Status   Specimen Description BLOOD RIGHT ARM  Final   Special Requests BOTTLES DRAWN AEROBIC AND ANAEROBIC 5CC  Final   Culture   Final    NO GROWTH 3 DAYS Performed at [facility], [place] [address] [place], [place] [address]    Report Status PENDING  Incomplete  Culture, blood (Routine X 2) w Reflex to ID Panel     Status: None (Preliminary result)   Collection Time: 06/17/16  9:58 AM  Result Value Ref Range Status   Specimen Description BLOOD RIGHT HAND  Final   Special Requests IN PEDIATRIC BOTTLE 3CC  Final   Culture   Final    NO GROWTH 3 DAYS Performed at [facility], [place] [address], [place], [place] [address]    Report Status PENDING  Incomplete         Radiology Studies: Dg Abd 2 Views  Result Date: 06/20/2016 CLINICAL DATA:  Small bowel obstruction. EXAM: ABDOMEN - 2 VIEW COMPARISON:  06/19/2016 FINDINGS: Enteric tube terminates in the proximal stomach with side hole near the GE junction.  There is no evidence of intraperitoneal free air. Small bowel dilatation has overall improved from the prior study, however a few mildly dilated loops of gas-filled bowel remain in the predominantly left half of the abdomen. An ostomy is noted in the right lower quadrant. Mild left basilar airspace opacity has mildly improved. IMPRESSION: 1. Decreasing small bowel dilatation suggestive of improving obstruction. 2. Mildly improved left lung base aeration. Electronically Signed   By: Logan Bores M.D.   On: 06/20/2016 08:38   Dg Abd 2 Views  Result Date: 06/19/2016 CLINICAL DATA:  Small-bowel obstruction.  Ulcerative colitis EXAM: ABDOMEN - 2 VIEW COMPARISON:  06/19/2016 FINDINGS: Small bowel dilatation with multiple air-fluid levels consistent with small bowel obstruction. No significant change from earlier today.  Contrast in the stomach and small bowel from CT. No significant colonic contrast identified. Colon decompressed. No free air on the upright view. IMPRESSION: Small-bowel obstruction pattern unchanged. Contrast does not reach the colon. No free air. Electronically Signed   By: Franchot Gallo M.D.   On: 06/19/2016 13:31   Dg Abd Acute W/chest  Result Date: 06/19/2016 CLINICAL DATA:  76 year old female with small bowel obstruction and pneumoperitoneum. Follow-up radiograph. EXAM: DG ABDOMEN ACUTE W/ 1V CHEST COMPARISON:  Abdominal radiograph dated 06/19/2016 and CT dated 06/17/2016 FINDINGS: Left lung base subsegmental atelectasis. The lungs are otherwise clear. No pleural effusion or pneumothorax. Top-normal cardiac size. Multiple surgical clips noted in the left breast. Persistent dilatation of loops of small bowel measuring up to 3.5 cm in caliber. Oral contrast noted within the stomach and proximal loops of small bowel. The stomach is distended and contains the majority of the contrast. There is dilution of the oral contrast with fluid and air in the distal small bowel. There is a right lower  quadrant ileostomy. There is contrast in the small bowel above the ostomy likely within a distal loop. Findings may represent ileus versus a low grade obstruction at the ileostomy. No definite free air identified. There is degenerative changes of the spine.  No acute fracture. IMPRESSION: 1. Persistent diffuse dilatation of small bowel to the level of the ileostomy may represent an ileus versus low grade obstruction at the ostomy. Small amount of contrast noted in the distal small bowel. 2. No definite pneumoperitoneum. 3. No acute cardiopulmonary process. Electronically Signed   By: Anner Crete M.D.   On: 06/19/2016 03:46   Dg Abd Portable 1v  Result Date: 06/19/2016 CLINICAL DATA:  Nasogastric tube placement.  Initial encounter. EXAM: PORTABLE ABDOMEN - 1 VIEW COMPARISON:  Abdominal radiograph performed earlier today at 1:04 p.m. FINDINGS: The patient's enteric tube is noted ending overlying the body of the stomach. The stomach is partially filled with air. Distended small bowel loops are similar in appearance to the prior study, concerning for small bowel obstruction. No free intra-abdominal air is seen, though evaluation for free air is limited on a single supine view. No acute osseous abnormalities are identified. Mild left basilar airspace opacity raises question for mild pneumonia. IMPRESSION: 1. Enteric tube noted ending overlying the body of the stomach. 2. Distended small bowel loops again noted, concerning for small obstruction. 3. Mild left basilar airspace opacity raises question for mild pneumonia, new from the prior study. Electronically Signed   By: Garald Balding M.D.   On: 06/19/2016 19:21   Dg Abd Portable 1v-small Bowel Obstruction Protocol-initial, 8 Hr Delay  Result Date: 06/19/2016 CLINICAL DATA:  Small bowel obstruction EXAM: PORTABLE ABDOMEN - 1 VIEW COMPARISON:  Abdominal radiograph 06/18/2016 FINDINGS: There is a large amount of free air within the abdomen. Multiple dilated  loops of small bowel are again seen. There is enteric contrast within the proximal small bowel but no transit to the colon or distal small bowel. IMPRESSION: Large volume pneumoperitoneum. If this is inconsistent with the clinical exam, a lateral decubitus radiograph could be obtained for confirmation. Critical Value/emergent results were called by telephone at the time of interpretation on 06/19/2016 at 2:07 am to K. Baltazar Najjar who verbally acknowledged these results. Electronically Signed   By: Ulyses Jarred M.D.   On: 06/19/2016 02:09        Scheduled Meds: . chlorhexidine  15 mL Mouth Rinse BID  . enoxaparin (LOVENOX) injection  40 mg Subcutaneous  Q24H  . famotidine (PEPCID) IV  20 mg Intravenous Q12H  . FLUoxetine  20 mg Oral Daily  . lidocaine  15 mL Mouth/Throat Once  . lidocaine-EPINEPHrine-tetracaine  3 mL Topical Once  . mouth rinse  15 mL Mouth Rinse q12n4p  . methylPREDNISolone (SOLU-MEDROL) injection  40 mg Intravenous Daily   Continuous Infusions: . 0.45 % NaCl with KCl 20 mEq / L 125 mL/hr at 06/20/16 1457     LOS: 3 days     Cordelia Poche [facility] 06/20/2016, 3:25 PM Pager: [phone]  If 7PM-7AM, please contact night-coverage [email or web address] Password [facility] 06/20/2016, 3:25 PM

## 2016-06-21 ENCOUNTER — Inpatient Hospital Stay (HOSPITAL_COMMUNITY): Payer: Medicare HMO

## 2016-06-21 DIAGNOSIS — R0789 Other chest pain: Secondary | ICD-10-CM

## 2016-06-21 LAB — CBC WITH DIFFERENTIAL/PLATELET
Basophils Absolute: 0 10*3/uL (ref 0.0–0.1)
Basophils Relative: 0 %
EOS ABS: 0 10*3/uL (ref 0.0–0.7)
EOS PCT: 0 %
HCT: 38.1 % (ref 36.0–46.0)
Hemoglobin: 12.6 g/dL (ref 12.0–15.0)
LYMPHS ABS: 1.4 10*3/uL (ref 0.7–4.0)
Lymphocytes Relative: 12 %
MCH: 30.2 pg (ref 26.0–34.0)
MCHC: 33.1 g/dL (ref 30.0–36.0)
MCV: 91.4 fL (ref 78.0–100.0)
MONOS PCT: 10 %
Monocytes Absolute: 1.1 10*3/uL — ABNORMAL HIGH (ref 0.1–1.0)
Neutro Abs: 9.3 10*3/uL — ABNORMAL HIGH (ref 1.7–7.7)
Neutrophils Relative %: 78 %
PLATELETS: 218 10*3/uL (ref 150–400)
RBC: 4.17 MIL/uL (ref 3.87–5.11)
RDW: 14.9 % (ref 11.5–15.5)
WBC: 11.9 10*3/uL — ABNORMAL HIGH (ref 4.0–10.5)

## 2016-06-21 LAB — BASIC METABOLIC PANEL
Anion gap: 6 (ref 5–15)
BUN: 14 mg/dL (ref 6–20)
CHLORIDE: 106 mmol/L (ref 101–111)
CO2: 19 mmol/L — ABNORMAL LOW (ref 22–32)
CREATININE: 0.61 mg/dL (ref 0.44–1.00)
Calcium: 8.3 mg/dL — ABNORMAL LOW (ref 8.9–10.3)
GFR calc Af Amer: >60 mL/min (ref >60)
GLUCOSE: 65 mg/dL (ref 65–99)
Potassium: 5.2 mmol/L — ABNORMAL HIGH (ref 3.5–5.1)
SODIUM: 131 mmol/L — AB (ref 135–145)

## 2016-06-21 LAB — TROPONIN I: TROPONIN I: 0.03 ng/mL — AB (ref <0.03)

## 2016-06-21 MED ORDER — METOPROLOL TARTRATE 5 MG/5ML IV SOLN
5.0000 mg | Freq: Once | INTRAVENOUS | Status: AC
  Administered 2016-06-21: 5 mg via INTRAVENOUS
  Filled 2016-06-21: qty 5

## 2016-06-21 MED ORDER — METOPROLOL TARTRATE 25 MG PO TABS
25.0000 mg | ORAL_TABLET | Freq: Two times a day (BID) | ORAL | Status: DC
  Administered 2016-06-21 – 2016-06-22 (×3): 25 mg via ORAL
  Filled 2016-06-21 (×4): qty 1

## 2016-06-21 MED ORDER — SODIUM CHLORIDE 0.9 % IV SOLN
INTRAVENOUS | Status: DC
  Administered 2016-06-21 – 2016-06-22 (×4): via INTRAVENOUS

## 2016-06-21 NOTE — Progress Notes (Signed)
Subjective: Pt sleeping comfortably this am.  Arouses with some difficulty.  No complaints currently  Objective: Vital signs in last 24 hours: Temp:  [97.4 F (36.3 C)-98.5 F (36.9 C)] 97.4 F (36.3 C) (02/24 0505) Pulse Rate:  [80-87] 80 (02/24 0505) Resp:  [17-20] 20 (02/24 0505) BP: (130-133)/(59-79) 130/70 (02/24 0505) SpO2:  [96 %-99 %] 99 % (02/24 0505) Last BM Date: 06/20/16  Intake/Output from previous day: 02/23 0701 - 02/24 0700 In: 3100 [P.O.:175; I.V.:2875; IV Piggyback:50] Out: 1280 [Urine:200; Emesis/NG output:300; Stool:780] Intake/Output this shift: Total I/O In: -  Out: 250 [Stool:250]  General appearance: alert, cooperative Resp: no distress GI: Her abdomen feels somewhat distended NG: bilious output  Lab Results:   Recent Labs  06/20/16 0416 06/21/16 0453  WBC 15.5* 11.9*  HGB 14.5 12.6  HCT 44.7 38.1  PLT 255 218    BMET  Recent Labs  06/20/16 0416 06/21/16 0453  NA 138 131*  K 4.4 5.2*  CL 112* 106  CO2 18* 19*  GLUCOSE 80 65  BUN 22* 14  CREATININE 0.92 0.61  CALCIUM 9.1 8.3*   PT/INR No results for input(s): LABPROT, INR in the last 72 hours.   Recent Labs Lab 06/17/16 0158 06/20/16 0416  AST 16 14*  ALT 14 12*  ALKPHOS 76 52  BILITOT 0.6 1.1  PROT 7.6 6.6  ALBUMIN 4.1 3.6     Lipase     Component Value Date/Time   LIPASE 21 06/17/2016 0158     Studies/Results: Dg Abd 2 Views  Result Date: 06/20/2016 CLINICAL DATA:  Small bowel obstruction. EXAM: ABDOMEN - 2 VIEW COMPARISON:  06/19/2016 FINDINGS: Enteric tube terminates in the proximal stomach with side hole near the GE junction. There is no evidence of intraperitoneal free air. Small bowel dilatation has overall improved from the prior study, however a few mildly dilated loops of gas-filled bowel remain in the predominantly left half of the abdomen. An ostomy is noted in the right lower quadrant. Mild left basilar airspace opacity has mildly improved.  IMPRESSION: 1. Decreasing small bowel dilatation suggestive of improving obstruction. 2. Mildly improved left lung base aeration. Electronically Signed   By: Logan Bores M.D.   On: 06/20/2016 08:38   Dg Abd 2 Views  Result Date: 06/19/2016 CLINICAL DATA:  Small-bowel obstruction.  Ulcerative colitis EXAM: ABDOMEN - 2 VIEW COMPARISON:  06/19/2016 FINDINGS: Small bowel dilatation with multiple air-fluid levels consistent with small bowel obstruction. No significant change from earlier today. Contrast in the stomach and small bowel from CT. No significant colonic contrast identified. Colon decompressed. No free air on the upright view. IMPRESSION: Small-bowel obstruction pattern unchanged. Contrast does not reach the colon. No free air. Electronically Signed   By: Franchot Gallo M.D.   On: 06/19/2016 13:31   Dg Abd Portable 1v  Result Date: 06/19/2016 CLINICAL DATA:  Nasogastric tube placement.  Initial encounter. EXAM: PORTABLE ABDOMEN - 1 VIEW COMPARISON:  Abdominal radiograph performed earlier today at 1:04 p.m. FINDINGS: The patient's enteric tube is noted ending overlying the body of the stomach. The stomach is partially filled with air. Distended small bowel loops are similar in appearance to the prior study, concerning for small bowel obstruction. No free intra-abdominal air is seen, though evaluation for free air is limited on a single supine view. No acute osseous abnormalities are identified. Mild left basilar airspace opacity raises question for mild pneumonia. IMPRESSION: 1. Enteric tube noted ending overlying the body of the stomach. 2. Distended  small bowel loops again noted, concerning for small obstruction. 3. Mild left basilar airspace opacity raises question for mild pneumonia, new from the prior study. Electronically Signed   By: Garald Balding M.D.   On: 06/19/2016 19:21    Medications: . chlorhexidine  15 mL Mouth Rinse BID  . enoxaparin (LOVENOX) injection  40 mg Subcutaneous Q24H   . famotidine (PEPCID) IV  20 mg Intravenous Q12H  . FLUoxetine  20 mg Oral Daily  . lidocaine  15 mL Mouth/Throat Once  . lidocaine-EPINEPHrine-tetracaine  3 mL Topical Once  . mouth rinse  15 mL Mouth Rinse q12n4p  . methylPREDNISolone (SOLU-MEDROL) injection  40 mg Intravenous Daily   . 0.45 % NaCl with KCl 20 mEq / L 125 mL/hr at 06/21/16 8921     Assessment/Plan SBO versus Crohn's flare.  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             History of Crohn's disease/ulcerative colitis - Dr. Hilarie Fredrickson, [facility]  gastroenterology Dr. Emelda Fear, [facility].             History of horseshoe perirectal abscess, with incision and  drainage/seton placement 01/22/16 Dr. Armandina Gemma.             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 Johnathan Hausen.  History of left breast cancer History of depression History of prior abdominal hysterectomy/appendectomy laparoscopic diverting colostomy Hx of anxiety and depression FEN: IV fluids/NPO ID: No antibiotics DVT: Lovenox    Plan: Still has one dilated loop on AXR this am.  Would cont NG to LWS for now.  Encourage ambulation.      LOS: 4 days    Sara Barnes C. 1/[birth year]/1740

## 2016-06-21 NOTE — Progress Notes (Signed)
Dr. Lonny Prude aware via phone of critical value, Troponin 0.03, received recently from lab. Lab value was read back and verified with technician. EKG ordered rendered results of Afib with RVR and HR 150's. Pt reported that mid chest discomfort as felt when MD rounded was better since NGT was discontinued. Pt ambulated several times this am and dyspnea noted with activity as well as rest. Dr. Lonny Prude instructed nurse to repeat EKG and notify him with results.

## 2016-06-21 NOTE — Progress Notes (Signed)
Pt's HR decreased to 78. Denies pain. Shortness of breath relieved. "I feel so much better," reported pt. Currently in NSR on monitor. Dr. Lonny Prude notified with plans to transfer to telemetry unit.

## 2016-06-21 NOTE — Progress Notes (Signed)
Pt transferred to [address] [place] via wc to assigned bed 1422. Accompanied by NT, nurse and sister.

## 2016-06-21 NOTE — Progress Notes (Signed)
Pt walking hall with sister. Reported feeling fatigued and short of breath. Apical pulse auscultated with irregularity noted. Mild chest discomfort noted. Sister assisted pt back to room and to bed. After a period of rest, second EKG done.

## 2016-06-21 NOTE — Progress Notes (Signed)
Second EKG results were Afib with RVR with HR in 150's. Rapid response nurse notified and currently at bedside. Monitor detecting Afib with HR 140-160. Mild chest discomfort, dyspnea and fatigue noted. Dr. Lonny Prude made aware via phone of EKG results and pt status. New orders for IV Metoprolol entered by MD.

## 2016-06-21 NOTE — Progress Notes (Signed)
Pt transferred from 5th floor approximately 1630 in stable condition.  BP 113/61 (BP Location: Right Arm)   Pulse 70   Temp 98.2 F (36.8 C) (Oral)   Resp 20   Ht 5' 6"  (1.676 m)   Wt 61.7 kg (136 lb 0.4 oz)   SpO2 98%   BMI 21.95 kg/m  Denies pain at this time.  Up with assistance to bathroom, unsteady gait/  Family member at bedside.

## 2016-06-21 NOTE — Progress Notes (Signed)
Report called to [address] [place] and given to [place], [place] regarding pt's status. VSS. No complaints voiced. Denies pain. HR 77 w/BP 106/72. Sister with pt.

## 2016-06-21 NOTE — Progress Notes (Signed)
PROGRESS NOTE    Sara Barnes  RCV:[id number] DOB: [birth year] DOA: 06/17/2016 PCP: Ann Held, DO   Brief Narrative: Sara Barnes is a 76 y.o.  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 Her abdominal pain has started to improve.   Assessment & Plan:   Active Problems:   Leucocytosis   Ulcerative pancolitis with abscess (HCC)   Ulcerative pancolitis with rectal bleeding (HCC)   Small bowel obstruction   Nausea and vomiting   Abdominal pain Small bowel obstruction Concern for IBD flare initially but appears likely secondary to obstruction. NG tube clamped yesterday but restarted now. Still some good output with persistently dilated loops on abdominal x-ray. -general surgery recommendations: continue NG tube  Ulcerative colitis -continue solumedrol IV  Dehydration Improved secondary to IV fluids -keep IV fluids until tolerating oral hydration (switched to normal saline)  Anxiety -continue fluoxetine  Leukocytosis Resolved.  Insomnia -continue Ambien  Chest discomfort Seems this is likely secondary to NG tube rather than cardiac cause. -EKG -troponin -chest x-ray  Hyperkalemia Mild. On potassium containing fluids -discontinue potassium containing fluids   DVT prophylaxis: Lovenox Code Status: Full code Family Communication: None at bedside Disposition Plan: Discharge pending clinical improvement   Consultants:   Gastroenterology  General surgery  Procedures:   None  Antimicrobials:  None    Subjective: Chest discomfort today with associated feeling of dyspnea. Does not describe it as pain. No radiation and is located almost mid-epigastric. Episode started yesterday and has been constant. No syncope, palpitations or leg swelling.  Objective: Vitals:   06/20/16  0454 06/20/16 1400 06/20/16 2059 06/21/16 0505  BP: (!) 142/84 (!) 131/59 133/79 130/70  Pulse: 95 86 87 80  Resp: 15 17 18 20   Temp: 98 F (36.7 C) 98.2 F (36.8 C) 98.5 F (36.9 C) 97.4 F (36.3 C)  TempSrc: Oral Oral Oral Oral  SpO2: 97% 96% 99% 99%  Weight:      Height:        Intake/Output Summary (Last 24 hours) at 06/21/16 1103 Last data filed at 06/21/16 1000  Gross per 24 hour  Intake             2600 ml  Output             1200 ml  Net             1400 ml   Filed Weights   06/17/16 0933  Weight: 63.5 kg (140 lb)    Examination:  General exam: Appears calm and comfortable Respiratory system: Clear to auscultation. Respiratory effort normal. Cardiovascular system: S1 & S2 heard, RRR. No murmurs. Gastrointestinal system: Abdomen is nondistended, soft and tender in lower quadrants. Improved bowel sounds heard. Central nervous system: Alert and oriented. Difficulty hearing. Extremities: No edema. No calf tenderness Skin: No cyanosis. No rashes Psychiatry: Judgement and insight appear normal. Mood & affect slightly anxious and normal.     Data Reviewed: I have personally reviewed following labs and imaging studies  CBC:  Recent Labs Lab 06/17/16 0158 06/18/16 0635 06/20/16 0416 06/21/16 0453  WBC 25.1* 10.3 15.5* 11.9*  NEUTROABS  --   --   --  9.3*  HGB 17.2* 15.5* 14.5 12.6  HCT 48.1* 45.6 44.7 38.1  MCV 87.3 90.7 92.9 91.4  PLT 312 254 255 102   Basic Metabolic Panel:  Recent Labs Lab 06/17/16 0158 06/18/16 0635 06/20/16 0416 06/21/16 0453  NA 135 137 138 131*  K 3.9 4.1 4.4 5.2*  CL 109 111 112* 106  CO2 16* 20* 18* 19*  GLUCOSE 122* 80 80 65  BUN 13 12 22* 14  CREATININE 0.74 0.78 0.92 0.61  CALCIUM 9.6 9.3 9.1 8.3*   GFR: Estimated Creatinine Clearance: 57.8 mL/min (by C-G formula based on SCr of 0.61 mg/dL). Liver Function Tests:  Recent Labs Lab 06/17/16 0158 06/20/16 0416  AST 16 14*  ALT 14 12*  ALKPHOS 76 52  BILITOT  0.6 1.1  PROT 7.6 6.6  ALBUMIN 4.1 3.6    Recent Labs Lab 06/17/16 0158  LIPASE 21   No results for input(s): AMMONIA in the last 168 hours. Coagulation Profile: No results for input(s): INR, PROTIME in the last 168 hours. Cardiac Enzymes: No results for input(s): CKTOTAL, CKMB, CKMBINDEX, TROPONINI in the last 168 hours. BNP (last 3 results) No results for input(s): PROBNP in the last 8760 hours. HbA1C: No results for input(s): HGBA1C in the last 72 hours. CBG: No results for input(s): GLUCAP in the last 168 hours. Lipid Profile: No results for input(s): CHOL, HDL, LDLCALC, TRIG, CHOLHDL, LDLDIRECT in the last 72 hours. Thyroid Function Tests: No results for input(s): TSH, T4TOTAL, FREET4, T3FREE, THYROIDAB in the last 72 hours. Anemia Panel: No results for input(s): VITAMINB12, FOLATE, FERRITIN, TIBC, IRON, RETICCTPCT in the last 72 hours. Sepsis Labs: No results for input(s): PROCALCITON, LATICACIDVEN in the last 168 hours.  Recent Results (from the past 240 hour(s))  Urine culture     Status: Abnormal   Collection Time: 06/17/16  6:15 AM  Result Value Ref Range Status   Specimen Description URINE, CLEAN CATCH  Final   Special Requests NONE  Final   Culture (A)  Final    <10,000 COLONIES/mL INSIGNIFICANT GROWTH Performed at [facility], [address], [place], [place] [address]    Report Status 06/18/2016 FINAL  Final  Culture, blood (Routine X 2) w Reflex to ID Panel     Status: None (Preliminary result)   Collection Time: 06/17/16  9:52 AM  Result Value Ref Range Status   Specimen Description BLOOD RIGHT ARM  Final   Special Requests BOTTLES DRAWN AEROBIC AND ANAEROBIC 5CC  Final   Culture   Final    NO GROWTH 4 DAYS Performed at [facility], [place] [address] [place], [place] [address]    Report Status PENDING  Incomplete  Culture, blood (Routine X 2) w Reflex to ID Panel     Status: None (Preliminary result)   Collection Time: 06/17/16  9:58 AM    Result Value Ref Range Status   Specimen Description BLOOD RIGHT HAND  Final   Special Requests IN PEDIATRIC BOTTLE 3CC  Final   Culture   Final    NO GROWTH 4 DAYS Performed at [facility], [place] [address], [place], [place] [address]    Report Status PENDING  Incomplete         Radiology Studies: Dg Abd 1 View  Result Date: 06/21/2016 CLINICAL DATA:  Follow-up abdominal distension and pain. Patient with inflammatory bowel disease and history of prior bowel surgery. EXAM: ABDOMEN - 1 VIEW COMPARISON:  06/20/2016 FINDINGS: The tip of an NG tube is identified overlying mid stomach. Decreased small bowel distention noted with only a single loop of gas-filled small bowel within the upper left abdomen, upper limits of  normal in caliber. Evidence of right-sided ostomy noted. IMPRESSION: Decreased small bowel gas/distention as discussed. Electronically Signed   By: Margarette Canada M.D.   On: 06/21/2016 09:20   Dg Abd 2 Views  Result Date: 06/20/2016 CLINICAL DATA:  Small bowel obstruction. EXAM: ABDOMEN - 2 VIEW COMPARISON:  06/19/2016 FINDINGS: Enteric tube terminates in the proximal stomach with side hole near the GE junction. There is no evidence of intraperitoneal free air. Small bowel dilatation has overall improved from the prior study, however a few mildly dilated loops of gas-filled bowel remain in the predominantly left half of the abdomen. An ostomy is noted in the right lower quadrant. Mild left basilar airspace opacity has mildly improved. IMPRESSION: 1. Decreasing small bowel dilatation suggestive of improving obstruction. 2. Mildly improved left lung base aeration. Electronically Signed   By: Logan Bores M.D.   On: 06/20/2016 08:38   Dg Abd 2 Views  Result Date: 06/19/2016 CLINICAL DATA:  Small-bowel obstruction.  Ulcerative colitis EXAM: ABDOMEN - 2 VIEW COMPARISON:  06/19/2016 FINDINGS: Small bowel dilatation with multiple air-fluid levels consistent with small bowel  obstruction. No significant change from earlier today. Contrast in the stomach and small bowel from CT. No significant colonic contrast identified. Colon decompressed. No free air on the upright view. IMPRESSION: Small-bowel obstruction pattern unchanged. Contrast does not reach the colon. No free air. Electronically Signed   By: Franchot Gallo M.D.   On: 06/19/2016 13:31   Dg Abd Portable 1v  Result Date: 06/19/2016 CLINICAL DATA:  Nasogastric tube placement.  Initial encounter. EXAM: PORTABLE ABDOMEN - 1 VIEW COMPARISON:  Abdominal radiograph performed earlier today at 1:04 p.m. FINDINGS: The patient's enteric tube is noted ending overlying the body of the stomach. The stomach is partially filled with air. Distended small bowel loops are similar in appearance to the prior study, concerning for small bowel obstruction. No free intra-abdominal air is seen, though evaluation for free air is limited on a single supine view. No acute osseous abnormalities are identified. Mild left basilar airspace opacity raises question for mild pneumonia. IMPRESSION: 1. Enteric tube noted ending overlying the body of the stomach. 2. Distended small bowel loops again noted, concerning for small obstruction. 3. Mild left basilar airspace opacity raises question for mild pneumonia, new from the prior study. Electronically Signed   By: Garald Balding M.D.   On: 06/19/2016 19:21        Scheduled Meds: . chlorhexidine  15 mL Mouth Rinse BID  . enoxaparin (LOVENOX) injection  40 mg Subcutaneous Q24H  . famotidine (PEPCID) IV  20 mg Intravenous Q12H  . FLUoxetine  20 mg Oral Daily  . lidocaine  15 mL Mouth/Throat Once  . lidocaine-EPINEPHrine-tetracaine  3 mL Topical Once  . mouth rinse  15 mL Mouth Rinse q12n4p  . methylPREDNISolone (SOLU-MEDROL) injection  40 mg Intravenous Daily   Continuous Infusions: . sodium chloride       LOS: 4 days     Cordelia Poche [facility] 06/21/2016, 11:03 AM Pager: [phone]  If 7PM-7AM, please contact night-coverage [email or web address] Password [email or web address] 06/21/2016, 11:03 AM

## 2016-06-21 NOTE — Progress Notes (Signed)
Rapid Response Event Note  Overview:  Called to 1525 d/t patient with shortness of breath and tachycardia- EKG showed a fib RVR    Initial Focused Assessment: Pt placed on RRT monitor rate 140s-160, irregular BP 126/74 Pulse ox 97% on RA Regular, symmetrical breathing Pt admits mild chest pain, mild shortness of breath, and weakness  Interventions:  MD notified, orders for IV metoprolol given Patient returned to NSR 70s Pt will be transferred to tele floor  Plan of Care (if not transferred):  Monitor heart rhythm on tele floor  Event Summary:   at  stable @ 14:27    at          [facility], [place]anna Box

## 2016-06-21 NOTE — Progress Notes (Signed)
Metoprolol obtained and given per order. RRT remains at bedside.

## 2016-06-22 ENCOUNTER — Inpatient Hospital Stay (HOSPITAL_COMMUNITY): Payer: Medicare HMO

## 2016-06-22 DIAGNOSIS — I4891 Unspecified atrial fibrillation: Secondary | ICD-10-CM

## 2016-06-22 LAB — BASIC METABOLIC PANEL
Anion gap: 10 (ref 5–15)
BUN: 15 mg/dL (ref 6–20)
CALCIUM: 9 mg/dL (ref 8.9–10.3)
CO2: 19 mmol/L — AB (ref 22–32)
CREATININE: 0.62 mg/dL (ref 0.44–1.00)
Chloride: 108 mmol/L (ref 101–111)
GFR calc Af Amer: >60 mL/min (ref >60)
GLUCOSE: 103 mg/dL — AB (ref 65–99)
Potassium: 3.7 mmol/L (ref 3.5–5.1)
Sodium: 137 mmol/L (ref 135–145)

## 2016-06-22 LAB — CULTURE, BLOOD (ROUTINE X 2)
CULTURE: NO GROWTH
Culture: NO GROWTH

## 2016-06-22 LAB — ECHOCARDIOGRAM COMPLETE
HEIGHTINCHES: 66 in
WEIGHTICAEL: 2176.38 [oz_av]

## 2016-06-22 NOTE — Progress Notes (Signed)
  Echocardiogram 2D Echocardiogram has been performed.  Sara Barnes 06/22/2016, 9:54 AM

## 2016-06-22 NOTE — Progress Notes (Signed)
Patient ID: Sara Barnes, female   DOB: 05/19/1940, 76 y.o.   MRN: [id number]     Subjective: She was transferred to telemetry due to rapid A. fib. This has stabilized and she denies any chest pain or palpitations currently. NG tube was removed yesterday. She has no nausea. She is hungry. Ileostomy has had a large output.  Objective: Vital signs in last 24 hours: Temp:  [97.8 F (36.6 C)-98.2 F (36.8 C)] 97.8 F (36.6 C) (02/25 0457) Pulse Rate:  [59-150] 59 (02/25 0457) Resp:  [18-20] 20 (02/25 0457) BP: (106-131)/(61-89) 131/78 (02/25 0457) SpO2:  [96 %-98 %] 98 % (02/25 0457) Weight:  [61.7 kg (136 lb 0.4 oz)] 61.7 kg (136 lb 0.4 oz) (02/24 1630) Last BM Date: 06/21/16  Intake/Output from previous day: 02/24 0701 - 02/25 0700 In: 1676.7 [I.V.:1576.7; IV Piggyback:100] Out: 1200 [Urine:350; Emesis/NG output:100; Stool:750] Intake/Output this shift: Total I/O In: 0  Out: 400 [Urine:300; Stool:100]  General appearance: alert, cooperative Resp: no distress GI: Soft and nondistended. Large amount of liquid stool in ileostomy bag.   Lab Results:   Recent Labs  06/20/16 0416 06/21/16 0453  WBC 15.5* 11.9*  HGB 14.5 12.6  HCT 44.7 38.1  PLT 255 218    BMET  Recent Labs  06/20/16 0416 06/21/16 0453  NA 138 131*  K 4.4 5.2*  CL 112* 106  CO2 18* 19*  GLUCOSE 80 65  BUN 22* 14  CREATININE 0.92 0.61  CALCIUM 9.1 8.3*   PT/INR No results for input(s): LABPROT, INR in the last 72 hours.   Recent Labs Lab 06/17/16 0158 06/20/16 0416  AST 16 14*  ALT 14 12*  ALKPHOS 76 52  BILITOT 0.6 1.1  PROT 7.6 6.6  ALBUMIN 4.1 3.6     Lipase     Component Value Date/Time   LIPASE 21 06/17/2016 0158     Studies/Results: Dg Abd 1 View  Result Date: 06/21/2016 CLINICAL DATA:  Follow-up abdominal distension and pain. Patient with inflammatory bowel disease and history of prior bowel surgery. EXAM: ABDOMEN - 1 VIEW COMPARISON:  06/20/2016 FINDINGS: The tip of an  NG tube is identified overlying mid stomach. Decreased small bowel distention noted with only a single loop of gas-filled small bowel within the upper left abdomen, upper limits of normal in caliber. Evidence of right-sided ostomy noted. IMPRESSION: Decreased small bowel gas/distention as discussed. Electronically Signed   By: Margarette Canada M.D.   On: 06/21/2016 09:20   Dg Chest Port 1 View  Result Date: 06/21/2016 CLINICAL DATA:  Chest pain.  History of breast cancer EXAM: PORTABLE CHEST 1 VIEW COMPARISON:  06/19/2016 FINDINGS: Normal heart size. No pleural effusion or edema. There is no airspace opacities identified. Visualized osseous structures appear intact. IMPRESSION: No acute cardiopulmonary abnormalities. Electronically Signed   By: Kerby Moors M.D.   On: 06/21/2016 13:02    Medications: . chlorhexidine  15 mL Mouth Rinse BID  . enoxaparin (LOVENOX) injection  40 mg Subcutaneous Q24H  . famotidine (PEPCID) IV  20 mg Intravenous Q12H  . FLUoxetine  20 mg Oral Daily  . lidocaine  15 mL Mouth/Throat Once  . lidocaine-EPINEPHrine-tetracaine  3 mL Topical Once  . mouth rinse  15 mL Mouth Rinse q12n4p  . methylPREDNISolone (SOLU-MEDROL) injection  40 mg Intravenous Daily  . metoprolol tartrate  25 mg Oral BID   . sodium chloride 100 mL/hr at 06/22/16 0017     Assessment/Plan SBO versus Crohn's flare.  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             History of Crohn's disease/ulcerative colitis - Dr. Hilarie Fredrickson, [facility]  gastroenterology Dr. Emelda Fear, [facility].             History of horseshoe perirectal abscess, with incision and  drainage/seton placement 01/22/16 Dr. Armandina Gemma.             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 Johnathan Hausen.              SBO appears to be resolving              New onset atrial fibrillation rate controlled  History of left breast cancer History of  depression History of prior abdominal hysterectomy/appendectomy laparoscopic diverting colostomy Hx of anxiety and depression FEN: IV fluids/Start clear liquid diet ID: No antibiotics DVT: Lovenox    Plan: Her liquid diet      LOS: 5 days    Jshawn Hurta T 06/22/2016

## 2016-06-22 NOTE — Progress Notes (Signed)
PROGRESS NOTE    Sara Barnes  AGT:[id number] DOB: August 02, 1940 DOA: 06/17/2016 PCP: Ann Held, DO   Brief Narrative: Sara Barnes is a 76 y.o.  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 Her abdominal pain has started to improve.   Assessment & Plan:   Active Problems:   Leucocytosis   Ulcerative pancolitis with abscess (HCC)   Ulcerative pancolitis with rectal bleeding (HCC)   Small bowel obstruction   Nausea and vomiting   Chest discomfort   Abdominal pain Small bowel obstruction Concern for IBD flare initially but appears likely secondary to obstruction. NG tube clamped yesterday but restarted now. Still some good output with persistently dilated loops on abdominal x-ray. -general surgery recommendations: advancing diet  Atrial fibrillation with RVR Episode occurred yesterday. Returned to sinus rhythm with one dose of metoprolol IV. CHA2DS2-VASc Score is 2. Currently rate controlled. -continue metoprolol -discuss with cardiology with regard to starting anticoagulation -continue telemetry  Ulcerative colitis -continue solumedrol IV  Dehydration Improved secondary to IV fluids -keep IV fluids until tolerating oral hydration (switched to normal saline)  Anxiety -continue fluoxetine  Leukocytosis Resolved.  Insomnia -continue Ambien  Chest discomfort Secondary to NG tube and possibly a component from atrial fibrillation. Resolved.  Hyperkalemia Mild. -recheck BMP this morning   DVT prophylaxis: Lovenox Code Status: Full code Family Communication: None at bedside Disposition Plan: Discharge pending clinical improvement likely in 2 days   Consultants:   Gastroenterology  General surgery  Procedures:   None  Antimicrobials:  None    Subjective: Chest  discomfort has resolved. Abdominal pain is improved. She is looking forward to eating.  Objective: Vitals:   06/21/16 1610 06/21/16 1630 06/21/16 2033 06/22/16 0457  BP: 106/72 113/61 116/67 131/78  Pulse: 77 70 68 (!) 59  Resp:  20 20 20   Temp:  98.2 F (36.8 C) 97.8 F (36.6 C) 97.8 F (36.6 C)  TempSrc:  Oral Oral Oral  SpO2:  98% 96% 98%  Weight:  61.7 kg (136 lb 0.4 oz)    Height:  5' 6"  (1.676 m)      Intake/Output Summary (Last 24 hours) at 06/22/16 1106 Last data filed at 06/22/16 1029  Gross per 24 hour  Intake          1676.67 ml  Output             1350 ml  Net           326.67 ml   Filed Weights   06/17/16 0933 06/21/16 1630  Weight: 63.5 kg (140 lb) 61.7 kg (136 lb 0.4 oz)    Examination:  General exam: Appears calm and comfortable Respiratory system: Clear to auscultation. Respiratory effort normal. Cardiovascular system: S1 & S2 heard, RRR. No murmurs. Gastrointestinal system: Abdomen is nondistended, soft and non-tender. Bowel sounds. Central nervous system: Alert and oriented. Difficulty hearing. Extremities: No edema. No calf tenderness Skin: No cyanosis. No rashes Psychiatry: Judgement and insight appear normal. Mood & affect slightly anxious and normal.     Data Reviewed: I have personally reviewed following labs and imaging studies  CBC:  Recent Labs Lab 06/17/16 0158 06/18/16 0635 06/20/16 0416 06/21/16 0453  WBC 25.1* 10.3 15.5* 11.9*  NEUTROABS  --   --   --  9.3*  HGB 17.2* 15.5* 14.5 12.6  HCT 48.1* 45.6 44.7  38.1  MCV 87.3 90.7 92.9 91.4  PLT 312 254 255 761   Basic Metabolic Panel:  Recent Labs Lab 06/17/16 0158 06/18/16 0635 06/20/16 0416 06/21/16 0453  NA 135 137 138 131*  K 3.9 4.1 4.4 5.2*  CL 109 111 112* 106  CO2 16* 20* 18* 19*  GLUCOSE 122* 80 80 65  BUN 13 12 22* 14  CREATININE 0.74 0.78 0.92 0.61  CALCIUM 9.6 9.3 9.1 8.3*   GFR: Estimated Creatinine Clearance: 57.8 mL/min (by C-G formula based on SCr  of 0.61 mg/dL). Liver Function Tests:  Recent Labs Lab 06/17/16 0158 06/20/16 0416  AST 16 14*  ALT 14 12*  ALKPHOS 76 52  BILITOT 0.6 1.1  PROT 7.6 6.6  ALBUMIN 4.1 3.6    Recent Labs Lab 06/17/16 0158  LIPASE 21   No results for input(s): AMMONIA in the last 168 hours. Coagulation Profile: No results for input(s): INR, PROTIME in the last 168 hours. Cardiac Enzymes:  Recent Labs Lab 06/21/16 1140  TROPONINI 0.03*   BNP (last 3 results) No results for input(s): PROBNP in the last 8760 hours. HbA1C: No results for input(s): HGBA1C in the last 72 hours. CBG: No results for input(s): GLUCAP in the last 168 hours. Lipid Profile: No results for input(s): CHOL, HDL, LDLCALC, TRIG, CHOLHDL, LDLDIRECT in the last 72 hours. Thyroid Function Tests: No results for input(s): TSH, T4TOTAL, FREET4, T3FREE, THYROIDAB in the last 72 hours. Anemia Panel: No results for input(s): VITAMINB12, FOLATE, FERRITIN, TIBC, IRON, RETICCTPCT in the last 72 hours. Sepsis Labs: No results for input(s): PROCALCITON, LATICACIDVEN in the last 168 hours.  Recent Results (from the past 240 hour(s))  Urine culture     Status: Abnormal   Collection Time: 06/17/16  6:15 AM  Result Value Ref Range Status   Specimen Description URINE, CLEAN CATCH  Final   Special Requests NONE  Final   Culture (A)  Final    <10,000 COLONIES/mL INSIGNIFICANT GROWTH Performed at [facility], [address], [place], [place] [address]    Report Status 06/18/2016 FINAL  Final  Culture, blood (Routine X 2) w Reflex to ID Panel     Status: None (Preliminary result)   Collection Time: 06/17/16  9:52 AM  Result Value Ref Range Status   Specimen Description BLOOD RIGHT ARM  Final   Special Requests BOTTLES DRAWN AEROBIC AND ANAEROBIC 5CC  Final   Culture   Final    NO GROWTH 4 DAYS Performed at [facility], [place] [address] [place], [place] [address]    Report Status PENDING  Incomplete  Culture, blood  (Routine X 2) w Reflex to ID Panel     Status: None (Preliminary result)   Collection Time: 06/17/16  9:58 AM  Result Value Ref Range Status   Specimen Description BLOOD RIGHT HAND  Final   Special Requests IN PEDIATRIC BOTTLE 3CC  Final   Culture   Final    NO GROWTH 4 DAYS Performed at [facility], [place] [address], [place], [place] [address]    Report Status PENDING  Incomplete         Radiology Studies: Dg Abd 1 View  Result Date: 06/21/2016 CLINICAL DATA:  Follow-up abdominal distension and pain. Patient with inflammatory bowel disease and history of prior bowel surgery. EXAM: ABDOMEN - 1 VIEW COMPARISON:  06/20/2016 FINDINGS: The tip of an NG tube is identified overlying mid stomach. Decreased small bowel distention noted with  only a single loop of gas-filled small bowel within the upper left abdomen, upper limits of normal in caliber. Evidence of right-sided ostomy noted. IMPRESSION: Decreased small bowel gas/distention as discussed. Electronically Signed   By: Margarette Canada M.D.   On: 06/21/2016 09:20   Dg Chest Port 1 View  Result Date: 06/21/2016 CLINICAL DATA:  Chest pain.  History of breast cancer EXAM: PORTABLE CHEST 1 VIEW COMPARISON:  06/19/2016 FINDINGS: Normal heart size. No pleural effusion or edema. There is no airspace opacities identified. Visualized osseous structures appear intact. IMPRESSION: No acute cardiopulmonary abnormalities. Electronically Signed   By: Kerby Moors M.D.   On: 06/21/2016 13:02        Scheduled Meds: . chlorhexidine  15 mL Mouth Rinse BID  . enoxaparin (LOVENOX) injection  40 mg Subcutaneous Q24H  . famotidine (PEPCID) IV  20 mg Intravenous Q12H  . FLUoxetine  20 mg Oral Daily  . lidocaine  15 mL Mouth/Throat Once  . lidocaine-EPINEPHrine-tetracaine  3 mL Topical Once  . mouth rinse  15 mL Mouth Rinse q12n4p  . methylPREDNISolone (SOLU-MEDROL) injection  40 mg Intravenous Daily  . metoprolol tartrate  25 mg Oral BID    Continuous Infusions: . sodium chloride 100 mL/hr at 06/22/16 0017     LOS: 5 days     Cordelia Poche [facility] 06/22/2016, 11:06 AM Pager: [phone]  If 7PM-7AM, please contact night-coverage [email or web address] Password [email or web address] 06/22/2016, 11:06 AM

## 2016-06-23 DIAGNOSIS — I480 Paroxysmal atrial fibrillation: Secondary | ICD-10-CM

## 2016-06-23 MED ORDER — METOPROLOL TARTRATE 25 MG PO TABS
12.5000 mg | ORAL_TABLET | Freq: Two times a day (BID) | ORAL | Status: DC
  Administered 2016-06-23: 12.5 mg via ORAL
  Filled 2016-06-23 (×2): qty 1

## 2016-06-23 MED ORDER — PREDNISONE 20 MG PO TABS
20.0000 mg | ORAL_TABLET | Freq: Every day | ORAL | Status: DC
  Administered 2016-06-24: 20 mg via ORAL
  Filled 2016-06-23: qty 1

## 2016-06-23 MED ORDER — METOPROLOL TARTRATE 25 MG PO TABS
12.5000 mg | ORAL_TABLET | Freq: Two times a day (BID) | ORAL | Status: DC
  Administered 2016-06-24: 12.5 mg via ORAL
  Filled 2016-06-23: qty 1

## 2016-06-23 MED ORDER — FAMOTIDINE 20 MG PO TABS
20.0000 mg | ORAL_TABLET | Freq: Two times a day (BID) | ORAL | Status: DC
  Administered 2016-06-23 – 2016-06-24 (×2): 20 mg via ORAL
  Filled 2016-06-23 (×2): qty 1

## 2016-06-23 NOTE — Progress Notes (Signed)
PROGRESS NOTE    Sara Barnes  GGY:[id number] DOB: 1940/10/18 DOA: 06/17/2016 PCP: Ann Held, DO   Brief Narrative: Sara Barnes is a 76 y.o.  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 Her abdominal pain has started to improve.   Assessment & Plan:   Active Problems:   Leucocytosis   Ulcerative pancolitis with abscess (HCC)   Ulcerative pancolitis with rectal bleeding (HCC)   Small bowel obstruction   Nausea and vomiting   Chest discomfort   Abdominal pain Small bowel obstruction Concern for IBD flare initially but appears likely secondary to obstruction. NG removed -general surgery recommendations: advancing diet  Atrial fibrillation with RVR Episode occurred on 2/24. Returned to sinus rhythm with one dose of metoprolol IV. CHA2DS2-VASc Score is 2. Currently rate controlled, however, has had bradycardia -reduce metoprolol to 12.10m BID -Cardiology consulted, will follow recommendations with regards to anticoagulation -continue telemetry  Ulcerative colitis -restart prednisone  Dehydration Improved secondary to IV fluids -discontinue IV fluids since taking oral nurtition  Anxiety -continue fluoxetine  Leukocytosis Resolved.  Insomnia -continue Ambien  Chest discomfort Secondary to NG tube and possibly a component from atrial fibrillation. Resolved.  Hyperkalemia Mild. Resolved with potassium supplentation   DVT prophylaxis: Lovenox Code Status: Full code Family Communication: None at bedside Disposition Plan: Discharge pending clinical improvement likely in 1-2 days   Consultants:   Gastroenterology  General surgery  Cardiology  Procedures:   None  Antimicrobials:  None    Subjective: No abdominal pain today. No chest pain. Eager to advance diet.  Reduced output from ostomy.  Objective: Vitals:   06/22/16 1050 06/22/16 1416 06/22/16 2034 06/23/16 0441  BP: 129/72 123/65 112/70 139/68  Pulse: (!) 57 (!) 59 (!) 56 (!) 49  Resp:  18 18 20   Temp:  97.7 F (36.5 C) 97.8 F (36.6 C) 98.1 F (36.7 C)  TempSrc:  Oral Oral Oral  SpO2:  97% 98% 98%  Weight:      Height:        Intake/Output Summary (Last 24 hours) at 06/23/16 1039 Last data filed at 06/23/16 0600  Gross per 24 hour  Intake             1780 ml  Output             1050 ml  Net              730 ml   Filed Weights   06/17/16 0933 06/21/16 1630  Weight: 63.5 kg (140 lb) 61.7 kg (136 lb 0.4 oz)    Examination:  General exam: Appears calm and comfortable Respiratory system: Clear to auscultation. Respiratory effort normal. Cardiovascular system: S1 & S2 heard, RRR. No murmurs. Gastrointestinal system: Abdomen is nondistended, soft and non-tender. Bowel sounds. Liquid output from ostomy. Central nervous system: Alert and oriented. Difficulty hearing. Extremities: No edema. No calf tenderness Skin: No cyanosis. No rashes Psychiatry: Judgement and insight appear normal. Mood & affect slightly anxious and normal.     Data Reviewed: I have personally reviewed following labs and imaging studies  CBC:  Recent Labs Lab 06/17/16 0158 06/18/16 0635 06/20/16 0416 06/21/16 0453  WBC 25.1* 10.3 15.5* 11.9*  NEUTROABS  --   --   --  9.3*  HGB 17.2* 15.5* 14.5 12.6  HCT 48.1* 45.6 44.7 38.1  MCV 87.3  90.7 92.9 91.4  PLT 312 254 255 614   Basic Metabolic Panel:  Recent Labs Lab 06/17/16 0158 06/18/16 0635 06/20/16 0416 06/21/16 0453 06/22/16 1107  NA 135 137 138 131* 137  K 3.9 4.1 4.4 5.2* 3.7  CL 109 111 112* 106 108  CO2 16* 20* 18* 19* 19*  GLUCOSE 122* 80 80 65 103*  BUN 13 12 22* 14 15  CREATININE 0.74 0.78 0.92 0.61 0.62  CALCIUM 9.6 9.3 9.1 8.3* 9.0   GFR: Estimated Creatinine Clearance: 57.8 mL/min (by C-G formula based on SCr of 0.62  mg/dL). Liver Function Tests:  Recent Labs Lab 06/17/16 0158 06/20/16 0416  AST 16 14*  ALT 14 12*  ALKPHOS 76 52  BILITOT 0.6 1.1  PROT 7.6 6.6  ALBUMIN 4.1 3.6    Recent Labs Lab 06/17/16 0158  LIPASE 21   No results for input(s): AMMONIA in the last 168 hours. Coagulation Profile: No results for input(s): INR, PROTIME in the last 168 hours. Cardiac Enzymes:  Recent Labs Lab 06/21/16 1140  TROPONINI 0.03*   BNP (last 3 results) No results for input(s): PROBNP in the last 8760 hours. HbA1C: No results for input(s): HGBA1C in the last 72 hours. CBG: No results for input(s): GLUCAP in the last 168 hours. Lipid Profile: No results for input(s): CHOL, HDL, LDLCALC, TRIG, CHOLHDL, LDLDIRECT in the last 72 hours. Thyroid Function Tests: No results for input(s): TSH, T4TOTAL, FREET4, T3FREE, THYROIDAB in the last 72 hours. Anemia Panel: No results for input(s): VITAMINB12, FOLATE, FERRITIN, TIBC, IRON, RETICCTPCT in the last 72 hours. Sepsis Labs: No results for input(s): PROCALCITON, LATICACIDVEN in the last 168 hours.  Recent Results (from the past 240 hour(s))  Urine culture     Status: Abnormal   Collection Time: 06/17/16  6:15 AM  Result Value Ref Range Status   Specimen Description URINE, CLEAN CATCH  Final   Special Requests NONE  Final   Culture (A)  Final    <10,000 COLONIES/mL INSIGNIFICANT GROWTH Performed at [facility], [address] [place], [place] [address]    Report Status 06/18/2016 FINAL  Final  Culture, blood (Routine X 2) w Reflex to ID Panel     Status: None   Collection Time: 06/17/16  9:52 AM  Result Value Ref Range Status   Specimen Description BLOOD RIGHT ARM  Final   Special Requests BOTTLES DRAWN AEROBIC AND ANAEROBIC 5CC  Final   Culture   Final    NO GROWTH 5 DAYS Performed at [facility], [place] [address], [place], [place] [address]    Report Status 06/22/2016 FINAL  Final  Culture, blood (Routine X 2) w Reflex  to ID Panel     Status: None   Collection Time: 06/17/16  9:58 AM  Result Value Ref Range Status   Specimen Description BLOOD RIGHT HAND  Final   Special Requests IN PEDIATRIC BOTTLE 3CC  Final   Culture   Final    NO GROWTH 5 DAYS Performed at [facility], Time [address] [place], [place] [address]    Report Status 06/22/2016 FINAL  Final         Radiology Studies: Dg Chest Port 1 View  Result Date: 06/21/2016 CLINICAL DATA:  Chest pain.  History of breast cancer EXAM: PORTABLE CHEST 1 VIEW COMPARISON:  06/19/2016 FINDINGS: Normal heart size. No pleural effusion or edema. There is no airspace opacities identified. Visualized osseous structures appear intact. IMPRESSION: No  acute cardiopulmonary abnormalities. Electronically Signed   By: Kerby Moors M.D.   On: 06/21/2016 13:02        Scheduled Meds: . chlorhexidine  15 mL Mouth Rinse BID  . enoxaparin (LOVENOX) injection  40 mg Subcutaneous Q24H  . famotidine (PEPCID) IV  20 mg Intravenous Q12H  . FLUoxetine  20 mg Oral Daily  . lidocaine  15 mL Mouth/Throat Once  . lidocaine-EPINEPHrine-tetracaine  3 mL Topical Once  . mouth rinse  15 mL Mouth Rinse q12n4p  . methylPREDNISolone (SOLU-MEDROL) injection  40 mg Intravenous Daily  . metoprolol tartrate  12.5 mg Oral BID   Continuous Infusions: . sodium chloride 100 mL/hr at 06/22/16 2045     LOS: 6 days     Cordelia Poche [facility] 06/23/2016, 10:39 AM Pager: [phone]  If 7PM-7AM, please contact night-coverage [email or web address] Password [facility] 06/23/2016, 10:39 AM

## 2016-06-23 NOTE — Evaluation (Signed)
Physical Therapy Evaluation Patient Details Name: Sara Barnes MRN: [id number] DOB: [birth year] Today's Date: 06/23/2016   History of Present Illness  76 y/o female with complex GI history including Crohn's disease with multiple abscesses, perirectal fissures, followed by [facility] as well as [facility] s/p laparoscopic diverting ileostomy 01/2016. She presented 06/17/16 with complaint of n/v and abdominal pain. There was concern for IBD flare initially, but now appears to be likely secondary to bowel obstruction. Pt had transient atrial fibrillation w/ RVR on 2/24 but converted after IV metoprolol x 1 .  Clinical Impression  Pt is independent with mobility, she ambulated 600' without an assistive device, no loss of balance, HR 70s walking. From PT standpoint she is ready to DC home, no follow up PT needs. PT signing off.     Follow Up Recommendations No PT follow up    Equipment Recommendations  None recommended by PT    Recommendations for Other Services       Precautions / Restrictions Precautions Precautions: None Precaution Comments: no falls in past few months Restrictions Weight Bearing Restrictions: No      Mobility  Bed Mobility Overal bed mobility: Independent                Transfers Overall transfer level: Independent                  Ambulation/Gait Ambulation/Gait assistance: Independent Ambulation Distance (Feet): 600 Feet Assistive device: None Gait Pattern/deviations: WFL(Within Functional Limits)   Gait velocity interpretation: at or above normal speed for age/gender General Gait Details: steady, mildly decreased velocity, no LOB, HR 70s walking, no dyspnea  Stairs            Wheelchair Mobility    Modified Rankin (Stroke Patients Only)       Balance Overall balance assessment: Independent                                           Pertinent Vitals/Pain Pain Assessment: No/denies pain    Home Living  Family/patient expects to be discharged to:: Private residence Living Arrangements: Alone Available Help at Discharge: Family;Available 24 hours/day   Home Access: Stairs to enter   Entrance Stairs-Number of Steps: 4 Home Layout: Multi-level Home Equipment: Walker - 2 wheels;Shower seat      Prior Function Level of Independence: Independent         Comments: walks without AD, uses shower seat to bathe, drives, has help for cleaning house     Hand Dominance   Dominant Hand: Right    Extremity/Trunk Assessment   Upper Extremity Assessment Upper Extremity Assessment: Overall WFL for tasks assessed    Lower Extremity Assessment Lower Extremity Assessment: Overall WFL for tasks assessed       Communication   Communication: HOH  Cognition Arousal/Alertness: Awake/alert Behavior During Therapy: WFL for tasks assessed/performed Overall Cognitive Status: Within Functional Limits for tasks assessed                      General Comments      Exercises     Assessment/Plan    PT Assessment Patent does not need any further PT services  PT Problem List         PT Treatment Interventions      PT Goals (Current goals can be found in the Care Plan section)  Acute  Rehab PT Goals PT Goal Formulation: All assessment and education complete, DC therapy    Frequency     Barriers to discharge        Co-evaluation               End of Session   Activity Tolerance: Patient tolerated treatment well Patient left: in bed;with call bell/phone within reach;with family/visitor present Nurse Communication: Mobility status           Time: 9735-3299 PT Time Calculation (min) (ACUTE ONLY): 9 min   Charges:   PT Evaluation $PT Eval Low Complexity: 1 Procedure     PT G Codes:         Sara Barnes 06/23/2016, 1:37 PM

## 2016-06-23 NOTE — Care Management Important Message (Signed)
Important Message  Patient Details  Name: Sara Barnes MRN: [id number] Date of Birth: [birth year]   Medicare Important Message Given:  Yes    Kerin Salen 06/23/2016, 10:37 AMImportant Message  Patient Details  Name: Sara Barnes MRN: [id number] Date of Birth: 06-19-1940   Medicare Important Message Given:  Yes    Kerin Salen 06/23/2016, 10:37 AM

## 2016-06-23 NOTE — Care Management Note (Signed)
Case Management Note  Patient Details  Name: Sara Barnes MRN: [id number] Date of Birth: 05/15/1940  Subjective/Objective:  SBO                  Action/Plan: Pt plan to D/C home with no needs. Pt states she would like to discharge early in the am.    Expected Discharge Date:   (UNKNOWN)               Expected Discharge Plan:  Home/Self Care  In-House Referral:  NA  Discharge planning Services  CM Consult  Post Acute Care Choice:  NA Choice offered to:  NA  DME Arranged:  N/A DME Agency:  NA  HH Arranged:  NA HH Agency:  NA  Status of Service:  Completed, signed off  If discussed at [place] of Stay Meetings, dates discussed:    Additional CommentsPurcell Mouton, RN 06/23/2016, 3:10 PM

## 2016-06-23 NOTE — Progress Notes (Signed)
  Subjective: Feels much better wants to have full liquids/grits/ tomato soup and crackers. Denise pain with no pain pills.    Objective: Vital signs in last 24 hours: Temp:  [97.7 F (36.5 C)-98.1 F (36.7 C)] 98.1 F (36.7 C) (02/26 0441) Pulse Rate:  [49-59] 49 (02/26 0441) Resp:  [18-20] 20 (02/26 0441) BP: (112-139)/(65-72) 139/68 (02/26 0441) SpO2:  [97 %-98 %] 98 % (02/26 0441) Last BM Date: 06/22/16 480 PO 1300 IV 300 urine recorded 1350 from ileostomy Afebrile, VSS BMP OK  Intake/Output from previous day: 02/25 0701 - 02/26 0700 In: 1780 [P.O.:480; I.V.:1200; IV Piggyback:100] Out: 1650 [Urine:300; Stool:1350] Intake/Output this shift: Total I/O In: -  Out: 100 [Stool:100]  General appearance: alert, cooperative and no distress GI: soft, non-tender; bowel sounds normal; no masses,  no organomegaly  Lab Results:   Recent Labs  06/21/16 0453  WBC 11.9*  HGB 12.6  HCT 38.1  PLT 218    BMET  Recent Labs  06/21/16 0453 06/22/16 1107  NA 131* 137  K 5.2* 3.7  CL 106 108  CO2 19* 19*  GLUCOSE 65 103*  BUN 14 15  CREATININE 0.61 0.62  CALCIUM 8.3* 9.0   PT/INR No results for input(s): LABPROT, INR in the last 72 hours.   Recent Labs Lab 06/17/16 0158 06/20/16 0416  AST 16 14*  ALT 14 12*  ALKPHOS 76 52  BILITOT 0.6 1.1  PROT 7.6 6.6  ALBUMIN 4.1 3.6     Lipase     Component Value Date/Time   LIPASE 21 06/17/2016 0158     Studies/Results: Dg Chest Port 1 View  Result Date: 06/21/2016 CLINICAL DATA:  Chest pain.  History of breast cancer EXAM: PORTABLE CHEST 1 VIEW COMPARISON:  06/19/2016 FINDINGS: Normal heart size. No pleural effusion or edema. There is no airspace opacities identified. Visualized osseous structures appear intact. IMPRESSION: No acute cardiopulmonary abnormalities. Electronically Signed   By: Kerby Moors M.D.   On: 06/21/2016 13:02    Medications: . chlorhexidine  15 mL Mouth Rinse BID  . enoxaparin  (LOVENOX) injection  40 mg Subcutaneous Q24H  . famotidine (PEPCID) IV  20 mg Intravenous Q12H  . FLUoxetine  20 mg Oral Daily  . lidocaine  15 mL Mouth/Throat Once  . lidocaine-EPINEPHrine-tetracaine  3 mL Topical Once  . mouth rinse  15 mL Mouth Rinse q12n4p  . metoprolol tartrate  12.5 mg Oral BID  . [START ON 06/24/2016] predniSONE  20 mg Oral Q breakfast    Assessment/Plan SBO versus Crohn's flare.  HD 4 Bilateral fistula tracts/flexible sigmoidoscopy 04/17/16 - currently with drainage. History of Crohn's disease/ulcerative colitis - Dr. Hilarie Fredrickson, [facility]    gastroenterology Dr. Emelda Fear, [facility]. History of horseshoe perirectal abscess, with incision and   drainage/[place] placement 01/22/16 Dr. Armandina Gemma. 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 Johnathan Hausen.              SBO appears to be resolving              New onset atrial fibrillation rate controlled  History of left breast cancer History of depression History of prior abdominal hysterectomy/appendectomy laparoscopic diverting colostomy Hx of anxiety and depression FEN: IV fluids/Start clear liquid diet ->>full liquids ID: No antibiotics DVT: Lovenox     Plan:  Full liquid diet and allow her to shower.  Telemetry shows mostly Sinus bradycardia.   LOS: 6 days    Develle Sievers 06/23/2016 [phone]

## 2016-06-23 NOTE — Progress Notes (Signed)
Pt's IV went bad and refuses a new IV as she is expecting to be discharged tomorrow. MD made aware.

## 2016-06-23 NOTE — Consult Note (Signed)
Patient ID: KYMBERLY BLOMBERG MRN: [id number], DOB/AGE: [birth year]   Admit date: 06/17/2016   Reason for Consult: Atrial Fibrillation w/ RVR/ ? Anticoagulation  Requesting MD: Dr. Lonny Prude, Internal Medicine    Primary Physician: Ann Held, DO Primary Cardiologist: New (Dr. Irish Lack)   Pt. Profile:  76 y/o female with complex GI history including Crohn's disease with multiple abscesses, perirectal fissures, followed by [facility] as well as [facility] s/p laparoscopic diverting ileostomy 01/2016. She presented 06/17/16 with complaint of n/v and abdominal pain. There was concern for IBD flare initially, but now appears to be likely secondary to bowel obstruction. Pt had transient atrial fibrillation w/ RVR on 2/24 but converted after IV metoprolol x 1 . Main ? For consult is regarding anticoagulation.   Problem List  Past Medical History:  Diagnosis Date  . Allergy   . Anemia   . Basal cell carcinoma   . Blood transfusion without reported diagnosis   . Cancer of lower-inner quadrant of female breast ([place]) 11/30/2014   left  . Cancer of lower-inner quadrant of left female breast ([place]) 11/30/2014  . Complication of anesthesia    anxiety post-op after ear surgeries  . Crohn's disease of large intestine with abscess ([place])   . Dental crowns present    recent root canal 11/2014  . Depression   . Family history of adverse reaction to anesthesia    pt's sister has hx. of post-op N/V  . Indigestion   . PONV (postoperative nausea and vomiting)   . Runny nose 12/07/2014   clear drainage, per pt.  . Seasonal allergies   . Ulcerative colitis   . Vitamin B12 deficiency     Past Surgical History:  Procedure Laterality Date  . ABDOMINAL HYSTERECTOMY     partial  . APPENDECTOMY    . BASAL CELL CARCINOMA EXCISION Left    nose  . BREAST ENHANCEMENT SURGERY    . BREAST LUMPECTOMY Left    age 44's  . BREAST LUMPECTOMY WITH RADIOACTIVE SEED AND SENTINEL LYMPH NODE BIOPSY Left 12/13/2014   Procedure: LEFT BREAST LUMPECTOMY WITH RADIOACTIVE SEED AND SENTINEL LYMPH NODE BIOPSY;  Surgeon: [name], MD;  Location: [facility];  Service: General;  Laterality: Left;  . COLONOSCOPY WITH PROPOFOL  05/28/2011; 06/28/2014  . FLEXIBLE SIGMOIDOSCOPY N/A 01/08/2016   Procedure: FLEXIBLE SIGMOIDOSCOPY;  Surgeon: [name], MD;  Location: [facility];  Service: Gastroenterology;  Laterality: N/A;  needs MAC due to pain and anxiety  . INCISION AND DRAINAGE PERIRECTAL ABSCESS N/A 09/28/2013   Procedure: IRRIGATION AND DEBRIDEMENT PERIANAL ABSCESS, proctoscopy;  Surgeon: Shann Medal, MD;  Location: [facility];  Service: General;  Laterality: N/A;  . INCISION AND DRAINAGE PERIRECTAL ABSCESS N/A 01/22/2016   Procedure: IRRIGATION AND DEBRIDEMENT PERIRECTAL ABSCESS;  Surgeon: Armandina Gemma, MD;  Location: [facility];  Service: General;  Laterality: N/A;  . INNER EAR SURGERY Bilateral   . LAPAROSCOPIC DIVERTED COLOSTOMY N/A 02/11/2016   Procedure: LAPAROSCOPIC DIVERTED ILEOSTOMY;  Surgeon: Johnathan Hausen, MD;  Location: [facility];  Service: General;  Laterality: N/A;     Allergies  Allergies  Allergen Reactions  . Codeine Nausea And Vomiting  . Mesalamine Nausea And Vomiting  . Other Itching    PERFUMED LOTIONS  Cant have MRI due to ear implant   . Oxycodone-Acetaminophen Rash    Itching & rash Tolerates Dilaudid  . Sulfa Antibiotics Rash    HPI  76 y/o female with complex GI history  including Crohn's disease with multiple abscesses, perirectal fissures, followed by [facility] as well as [facility] s/p laparoscopic diverting ileostomy 01/2016. She presented 06/17/16 with complaint of n/v and abdominal pain. There was concern for IBD flare initially, but now appears to be likely secondary to bowel obstruction. Hospital course complicated by atrial fibrillation w/ RVR for which cardiology has been consulted. Main ? For consult is regarding anticoagulation.   She has been admitted by  IM. General surgery is following and managing SBO and ? IBD flare with conservative mangement. Per chart, her atrial fibrillation occurred on 06/21/16. She returned to NSR with one dose of IV metoprolol. She has since remained in NSR. HR on telemetry currently in the 60s. No further recurrence noted. No prior h/o afib/flutter documented . Her CHA2DS2 VASc score is 2 for female sex and age 46-74.   The patient reports today that she is feeling much better. Abdominal pain, n/v resolved. She was symptomatic on 2/24 while in atrial fibrillation with tachy palpitations and dyspnea but denies any further recurrence since converting to NSR. She denies any prior cardiac history.    Home Medications  Prior to Admission medications   Medication Sig Start Date End Date Taking? Authorizing Provider  Cyanocobalamin (VITAMIN B-12 IJ) Inject 1 mL as directed every 30 (thirty) days.   Yes Historical Provider, MD  famotidine (PEPCID) 40 MG tablet Take 1 tablet (40 mg total) by mouth 2 (two) times daily. Patient taking differently: Take 40 mg by mouth daily as needed for heartburn or indigestion.  02/29/16  Yes Jerrye Beavers, PA-C  FLUoxetine (PROZAC) 20 MG capsule Take 1 capsule (20 mg total) by mouth 2 (two) times daily. 03/31/16  Yes Jerene Bears, MD  Multiple Vitamins-Minerals (MULTIVITAMIN ADULTS 50+ PO) Take 0.5 tablets by mouth daily.   Yes Historical Provider, MD  predniSONE (DELTASONE) 20 MG tablet Take 1 tablet (20 mg total) by mouth daily with breakfast. 06/05/16  Yes Jerene Bears, MD  zolpidem (AMBIEN) 10 MG tablet Take 1 tablet (10 mg total) by mouth at bedtime as needed for sleep. 04/17/16 06/17/16 Yes Jerene Bears, MD  betamethasone dipropionate (DIPROLENE) 0.05 % cream Apply topically 2 (two) times daily. Pharmacy-please d/c rx for clobetasol Patient not taking: Reported on 06/17/2016 04/09/16   Jerene Bears, MD    Scheduled Hospital Meds: . chlorhexidine  15 mL Mouth Rinse BID  . enoxaparin (LOVENOX)  injection  40 mg Subcutaneous Q24H  . famotidine (PEPCID) IV  20 mg Intravenous Q12H  . FLUoxetine  20 mg Oral Daily  . lidocaine  15 mL Mouth/Throat Once  . lidocaine-EPINEPHrine-tetracaine  3 mL Topical Once  . mouth rinse  15 mL Mouth Rinse q12n4p  . methylPREDNISolone (SOLU-MEDROL) injection  40 mg Intravenous Daily  . metoprolol tartrate  12.5 mg Oral BID   Continuous Infusions: . sodium chloride 100 mL/hr at 06/22/16 2045   PRN Meds:.acetaminophen **OR** acetaminophen, HYDROmorphone (DILAUDID) injection, LORazepam, ondansetron **OR** ondansetron (ZOFRAN) IV, phenol, zolpidem   Family History  Family History  Problem Relation Age of Onset  . Prostate cancer Brother 23  . Kidney disease Brother   . Diabetes Brother   . Brain cancer Father   . Rheum arthritis Mother   . Anesthesia problems Sister     post-op N/V  . Emphysema Maternal Grandfather   . Asthma Sister   . Rheum arthritis Brother   . Colon cancer Neg Hx   . Esophageal cancer Neg Hx   .  Breast cancer Neg Hx     Social History  Social History   Social History  . Marital status: Widowed    Spouse name: N/A  . Number of children: 1  . Years of education: N/A   Occupational History  . 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 Smoking status: Never Smoker  . Smokeless tobacco: Never Used  . Alcohol use 0.0 oz/week     Comment: daily wine with dinner  . Drug use: No  . Sexual activity: Not Currently    Partners: Male   Other Topics Concern  . Not on file   Social History Narrative   Treadmill/ walking--exercise     Review of Systems General:  No chills, fever, night sweats or weight changes.  Cardiovascular:  No chest pain, dyspnea on exertion, edema, orthopnea, palpitations, paroxysmal nocturnal dyspnea. Dermatological: No rash, lesions/masses Respiratory: No cough, dyspnea Urologic: No hematuria, dysuria Abdominal:   No nausea, vomiting, diarrhea, bright red  blood per rectum, melena, or hematemesis Neurologic:  No visual changes, wkns, changes in mental status. All other systems reviewed and are otherwise negative except as noted above.  Physical Exam  Blood pressure 139/68, pulse (!) 49, temperature 98.1 F (36.7 C), temperature source Oral, resp. rate 20, height _0  (1.676 m), weight 136 lb 0.4 oz (61.7 kg), SpO2 98 %.  General: Pleasant, NAD Psych: Normal affect. Neuro: Alert and oriented X 3. Moves all extremities spontaneously. HEENT: Normal  Neck: Supple without bruits or JVD. Lungs:  Resp regular and unlabored, CTA. Heart: RRR no s3, s4, or murmurs. Abdomen: Soft, non-tender, non-distended, BS + x 4.  Extremities: No clubbing, cyanosis or edema. DP/PT/Radials 2+ and equal bilaterally.  Labs  Troponin (Point of Care Test) No results for input(s): TROPIPOC in the last 72 hours.  Recent Labs  06/21/16 1140  TROPONINI 0.03*   Lab Results  Component Value Date   WBC 11.9 (H) 06/21/2016   HGB 12.6 06/21/2016   HCT 38.1 06/21/2016   MCV 91.4 06/21/2016   PLT 218 06/21/2016    Recent Labs Lab 06/20/16 0416  06/22/16 1107  NA 138  < > 137  K 4.4  < > 3.7  CL 112*  < > 108  CO2 18*  < > 19*  BUN 22*  < > 15  CREATININE 0.92  < > 0.62  CALCIUM 9.1  < > 9.0  PROT 6.6  --   --   BILITOT 1.1  --   --   ALKPHOS 52  --   --   ALT 12*  --   --   AST 14*  --   --   GLUCOSE 80  < > 103*  < > = values in this interval not displayed. Lab Results  Component Value Date   CHOL 198 01/30/2015   HDL 66.10 01/30/2015   LDLCALC 110 (H) 01/30/2015   TRIG 157 (H) 02/25/2016   No results found for: DDIMER   Radiology/Studies  Dg Abd 1 View  Result Date: 06/21/2016 CLINICAL DATA:  Follow-up abdominal distension and pain. Patient with inflammatory bowel disease and history of prior bowel surgery. EXAM: ABDOMEN - 1 VIEW COMPARISON:  06/20/2016 FINDINGS: The tip of an NG tube is identified overlying mid stomach. Decreased small  bowel distention noted with only a single loop of gas-filled small bowel within the upper left abdomen, upper limits of normal in caliber. Evidence of right-sided  ostomy noted. IMPRESSION: Decreased small bowel gas/distention as discussed. Electronically Signed   By: Margarette Canada M.D.   On: 06/21/2016 09:20   Ct Abdomen Pelvis W Contrast  Result Date: 06/17/2016 CLINICAL DATA:  76 year old female with lower abdominal pain. 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 EXAM: CT ABDOMEN AND PELVIS WITH CONTRAST TECHNIQUE: Multidetector CT imaging of the abdomen and pelvis was performed using the standard protocol following bolus administration of intravenous contrast. CONTRAST:  100 cc Isovue-300 COMPARISON:  Abdominal radiograph dated 02/23/2016 and CT dated 01/21/2016 FINDINGS: Lower chest: Minimal bibasilar dependent atelectatic changes of the lungs. The visualized lung bases are otherwise clear. No intra-abdominal free air. Diffuse mesenteric edema and small free fluid. Hepatobiliary: No focal liver abnormality is seen. No gallstones, gallbladder wall thickening, or biliary dilatation. Pancreas: Unremarkable. No pancreatic ductal dilatation or surrounding inflammatory changes. Spleen: Normal in size without focal abnormality. Adrenals/Urinary Tract: Adrenal glands are unremarkable. Kidneys are normal, without renal calculi, focal lesion, or hydronephrosis. Bladder is unremarkable. Stomach/Bowel: There is postsurgical changes of resection of the terminal ileum with the right lower quadrant diverting ileostomy. There is mild diffuse prominence of fluid-filled loops of mid to distal small bowel extending to the level of the ileostomy compatible with low grade or early obstruction. The transition zone is at the ileostomy likely related to underlying adhesion or stricture. The colon is collapsed. Diffuse thickened appearance of the colon likely related to underdistention.  Vascular/Lymphatic: There is mild aortoiliac atherosclerotic disease. The origins of the celiac axis, SMA, IMA as well as the origins of the renal arteries are patent. Nonocclusive scar noted at the origin of the SMA. The IVC is unremarkable. The SMV, splenic vein, and main portal vein are patent. No portal venous gas identified. There is no adenopathy. Reproductive: Hysterectomy. Other: Bilateral breast implants. Musculoskeletal: Degenerative changes of the spine. No acute fracture. IMPRESSION: 1. Postsurgical changes of bowel resection at the level of the terminal with a right lower quadrant ileostomy. There is mild dilatation of the small bowel extending to the ileostomy compatible with an early or low grade obstruction likely related to adhesion or stricture at the ileostomy. Clinical correlation and follow-up recommended. 2. Diffusely thickened colon likely related to underdistention. 3. Diffuse mesenteric edema and small ascites. Electronically Signed   By: Anner Crete M.D.   On: 06/17/2016 07:17   Dg Chest Port 1 View  Result Date: 06/21/2016 CLINICAL DATA:  Chest pain.  History of breast cancer EXAM: PORTABLE CHEST 1 VIEW COMPARISON:  06/19/2016 FINDINGS: Normal heart size. No pleural effusion or edema. There is no airspace opacities identified. Visualized osseous structures appear intact. IMPRESSION: No acute cardiopulmonary abnormalities. Electronically Signed   By: Kerby Moors M.D.   On: 06/21/2016 13:02   Dg Abd 2 Views  Result Date: 06/20/2016 CLINICAL DATA:  Small bowel obstruction. EXAM: ABDOMEN - 2 VIEW COMPARISON:  06/19/2016 FINDINGS: Enteric tube terminates in the proximal stomach with side hole near the GE junction. There is no evidence of intraperitoneal free air. Small bowel dilatation has overall improved from the prior study, however a few mildly dilated loops of gas-filled bowel remain in the predominantly left half of the abdomen. An ostomy is noted in the right lower  quadrant. Mild left basilar airspace opacity has mildly improved. IMPRESSION: 1. Decreasing small bowel dilatation suggestive of improving obstruction. 2. Mildly improved left lung base aeration. Electronically Signed   By: Logan Bores M.D.   On: 06/20/2016 08:38  Dg Abd 2 Views  Result Date: 06/19/2016 CLINICAL DATA:  Small-bowel obstruction.  Ulcerative colitis EXAM: ABDOMEN - 2 VIEW COMPARISON:  06/19/2016 FINDINGS: Small bowel dilatation with multiple air-fluid levels consistent with small bowel obstruction. No significant change from earlier today. Contrast in the stomach and small bowel from CT. No significant colonic contrast identified. Colon decompressed. No free air on the upright view. IMPRESSION: Small-bowel obstruction pattern unchanged. Contrast does not reach the colon. No free air. Electronically Signed   By: Franchot Gallo M.D.   On: 06/19/2016 13:31   Dg Abd 2 Views  Result Date: 06/18/2016 CLINICAL DATA:  Recent small bowel obstruction EXAM: ABDOMEN - 2 VIEW COMPARISON:  CT abdomen and pelvis June 17, 2016 FINDINGS: Supine and upright images were obtained. There are multiple loops of borderline dilated small bowel with scattered air-fluid levels. There is a right side ostomy. No free air. Visualized lung bases are clear. IMPRESSION: Findings felt to represent a degree of persistent bowel obstruction. No evident free air. Electronically Signed   By: Lowella Grip III M.D.   On: 06/18/2016 12:48   Dg Abd Acute W/chest  Result Date: 06/19/2016 CLINICAL DATA:  76 year old female with small bowel obstruction and pneumoperitoneum. Follow-up radiograph. EXAM: DG ABDOMEN ACUTE W/ 1V CHEST COMPARISON:  Abdominal radiograph dated 06/19/2016 and CT dated 06/17/2016 FINDINGS: Left lung base subsegmental atelectasis. The lungs are otherwise clear. No pleural effusion or pneumothorax. Top-normal cardiac size. Multiple surgical clips noted in the left breast. Persistent dilatation of loops  of small bowel measuring up to 3.5 cm in caliber. Oral contrast noted within the stomach and proximal loops of small bowel. The stomach is distended and contains the majority of the contrast. There is dilution of the oral contrast with fluid and air in the distal small bowel. There is a right lower quadrant ileostomy. There is contrast in the small bowel above the ostomy likely within a distal loop. Findings may represent ileus versus a low grade obstruction at the ileostomy. No definite free air identified. There is degenerative changes of the spine.  No acute fracture. IMPRESSION: 1. Persistent diffuse dilatation of small bowel to the level of the ileostomy may represent an ileus versus low grade obstruction at the ostomy. Small amount of contrast noted in the distal small bowel. 2. No definite pneumoperitoneum. 3. No acute cardiopulmonary process. Electronically Signed   By: Anner Crete M.D.   On: 06/19/2016 03:46   Dg Abd Portable 1v  Result Date: 06/19/2016 CLINICAL DATA:  Nasogastric tube placement.  Initial encounter. EXAM: PORTABLE ABDOMEN - 1 VIEW COMPARISON:  Abdominal radiograph performed earlier today at 1:04 p.m. FINDINGS: The patient's enteric tube is noted ending overlying the body of the stomach. The stomach is partially filled with air. Distended small bowel loops are similar in appearance to the prior study, concerning for small bowel obstruction. No free intra-abdominal air is seen, though evaluation for free air is limited on a single supine view. No acute osseous abnormalities are identified. Mild left basilar airspace opacity raises question for mild pneumonia. IMPRESSION: 1. Enteric tube noted ending overlying the body of the stomach. 2. Distended small bowel loops again noted, concerning for small obstruction. 3. Mild left basilar airspace opacity raises question for mild pneumonia, new from the prior study. Electronically Signed   By: Garald Balding M.D.   On: 06/19/2016 19:21    Dg Abd Portable 1v-small Bowel Obstruction Protocol-initial, 8 Hr Delay  Result Date: 06/19/2016 CLINICAL DATA:  Small bowel  obstruction EXAM: PORTABLE ABDOMEN - 1 VIEW COMPARISON:  Abdominal radiograph 06/18/2016 FINDINGS: There is a large amount of free air within the abdomen. Multiple dilated loops of small bowel are again seen. There is enteric contrast within the proximal small bowel but no transit to the colon or distal small bowel. IMPRESSION: Large volume pneumoperitoneum. If this is inconsistent with the clinical exam, a lateral decubitus radiograph could be obtained for confirmation. Critical Value/emergent results were called by telephone at the time of interpretation on 06/19/2016 at 2:07 am to K. Baltazar Najjar who verbally acknowledged these results. Electronically Signed   By: Ulyses Jarred M.D.   On: 06/19/2016 02:09    ECG 06/21/16 Atrial Fibrillation w/ RVR - personally reviewed.  Telemetry - NSR currently - personally reviewed   Echocardiogram 2D Echo 06/22/16  Study Conclusions  - Left ventricle: The cavity size was normal. Systolic function was   vigorous. The estimated ejection fraction was in the range of 65%   to 70%. Wall motion was normal; there were no regional wall   motion abnormalities. Features are consistent with a pseudonormal   left ventricular filling pattern, with concomitant abnormal   relaxation and increased filling pressure (grade 2 diastolic   dysfunction). Doppler parameters are consistent with high   ventricular filling pressure. - Aortic valve: There was mild regurgitation. - Atrial septum: There was increased thickness of the septum,   consistent with lipomatous hypertrophy.    ASSESSMENT AND PLAN  Active Problems:   Leucocytosis   Ulcerative pancolitis with abscess (HCC)   Ulcerative pancolitis with rectal bleeding ([facility]CC)   Small bowel obstruction   Nausea and vomiting   Chest discomfort   1. Transient Atrial Fibrillation: first noted  06/21/16 and, per records, returned to NSR with one dose of IV metoprolol. She has since remained in NSR. HR on telemetry currently in the 60s. No further recurrence noted. No prior h/o afib/flutter documented . Her CHA2DS2 VASc score is 2 for female sex and age 75-74. Echo shows normal LVEF. K is WNL. Unclear at this time if long term anticoagulation will be required as afib may have been triggered by underlying acute GI illness. May warrant an outpatient monitor once this acute process has resolved, to help determine long term requirements. For now, continue rate control with PO metoprolol. Continue to monitor on telemetry. Dr. Irish Lack to see later today and will provide further recommendations.   2. SBO/ other GI issues: per general surgery and internal medicine .    Signed, Lyda Jester, PA-C 06/23/2016, 9:25 AM   I have examined the patient and reviewed assessment and plan and discussed with patient.  Agree with above as stated.   AFib resolved with IV metoprolol.  No structural heart disease with a CHADS-vasc of 2.  AFib may have been from stress of this illness.  She had sx with her AFib.  We discussed anticoagulation.  She would prefer to hold off and see if AFIb comes back.  If it does, she would agree to anticoagulation.  I think this is reasonable.  OK to discharge on low dose metoprolol, 12.5 mg BID to help maintain NSR.    Larae Grooms

## 2016-06-24 DIAGNOSIS — I4891 Unspecified atrial fibrillation: Secondary | ICD-10-CM

## 2016-06-24 DIAGNOSIS — I480 Paroxysmal atrial fibrillation: Secondary | ICD-10-CM

## 2016-06-24 MED ORDER — METOPROLOL TARTRATE 25 MG PO TABS: 12.5000 mg | ORAL_TABLET | Freq: Two times a day (BID) | ORAL | 0 refills | Status: DC

## 2016-06-24 NOTE — Discharge Summary (Addendum)
Physician Discharge Summary  Sara Barnes UMP:[id number] DOB: August 20, 1940 DOA: 06/17/2016  PCP: Ann Held, DO  Admit date: 06/17/2016 Discharge date: 06/24/2016  Admitted From: Home Disposition: Home  Recommendations for Outpatient Follow-up:  1. Follow up with PCP in 1 week 2. Follow up with Cardiology for atrial fibrillation 3. Follow up with general surgery 4. Follow up with GI  Discharge Condition: Stable CODE STATUS: Full code   Brief/Interim Summary:  Admission HPI written by Orson Eva, MD   HPI:  Sara Barnes is a 76 y.o. 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 The patient states that she had innumerable episodes of vomiting to the point of dry heaving on the day prior to admission. The patient had subjective chills, but denies any headache, sore throat, coughing, chest congestion, chest pain, coughing, hemoptysis, shortness of breath. She does complain of some dysuria without hematuria. She states that she has continued to have some rectal bleeding and continues to pass more mucus per rectum in the past week. The patient has a competent to GI history and has been followed by Dr. Hilarie Fredrickson whom she saw on 06/05/2016 at which time, the patient's prednisone was increased back to 20 mg daily from 10 mg. The patient saw Dr. Emelda Fear at [facility] on 06/16/16 and has been referred to a colorectal surgeon, Dr. Sheryn Bison.  CT of the abdomen and pelvis in the ED showed diffuse thickening of the colon which was thought to be due to underdistention, but showed mild dilatation of the small bowel extending into the ileostomy consistent with low-grade suction. There was also diffuse mesenteric edema and small amount of ascites. The  patient refused NG tube. Gen. surgery and  [facility] were consulted. The patient was given fentanyl for pain and given a bolus of normal saline.  Regarding her GI history the patient had a most recent flexible sigmoidoscopy on 04/16/2016 which showed fistulous tracks from previous abscess drainage where Penrose drains were placed, but did not show any active drainage at the time of examination. There was moderate colitis in the entire examined colon along with 2 deep ulcerations in the distal rectum/anal canal. 2 inflammatory polyps were also biopsied. Pathology results showed moderately active chronic colitis with no dysplasia.  The patient was previously on metronidazole and tinidazole,but stopped them due to nausea.  She saw dermatology for the raised lesion on the left anterior shin. It was biopsied by dermatology and determined to be a skin cancer. This was treated with cryotherapy and has been improving.  She was hospitalized 01/07/16 through 02/29/16. She underwent irrigation debridement perirectal abscess on 01/22/16, by Dr. Harlow Asa. On 10/16/17she was taken back to the operating room with Crohn's colitis and underwent laparoscopic diverting ileostomy by Dr. Johnathan Hausen. Her steroids were slowly tapered, diet was increased with return of bowel function.This was slow, she was placed on TPN, for nutritional supportduring her stay.    Hospital course:  Abdominal pain Small bowel obstruction Concern for IBD flare initially but appears likely secondary to partial small bowel obstruction. Patient was initially advanced with the Metzenbaums were diet but then had acute worsening of pain. NG tube was placed and immediately evacuated about 750 mL liters of fluid. NG tube remained in for a couple of days it was removed.  Patient's diet was advanced as tolerated. She did well.  Atrial fibrillation with RVR Transient. Episode occurred on 2/24. Returned to sinus rhythm with one dose of metoprolol IV.  CHA2DS2-VASc Scoreis 2. Rate controlled now. Cardiology consulted and recommended to continue metoprolol. No anticoagulation for now.  Ulcerative colitis Continued steroids  Dehydration Improved secondary to IV fluids.  Anxiety Continued fluoxetine  Leukocytosis Likely stress reaction. Improved.  Insomnia Continued Ambien  Chest discomfort Secondary to NG tube and possibly a component from atrial fibrillation. Resolved with removal of NG tube and cessation of arrhythmia.  Hyperkalemia Mild. Resolved with IV fluids  Chronic diastolic heart failure EF of 65-70% with grade 2 diastolic dysfunction. Euvolemic.   Discharge Diagnoses:  Principal Problem:   Small bowel obstruction Active Problems:   Leucocytosis   Ulcerative pancolitis with abscess (HCC)   Ulcerative pancolitis with rectal bleeding (HCC)   Nausea and vomiting   Chest discomfort   Atrial fibrillation with RVR (HCC)    Discharge Instructions   Allergies as of 06/24/2016      Reactions   Codeine Nausea And Vomiting   Mesalamine Nausea And Vomiting   Other Itching   PERFUMED LOTIONS Cant have MRI due to ear implant    Oxycodone-acetaminophen Rash   Itching & rash Tolerates Dilaudid   Sulfa Antibiotics Rash      Medication List    STOP taking these medications   betamethasone dipropionate 0.05 % cream Commonly known as:  DIPROLENE     TAKE these medications   famotidine 40 MG tablet Commonly known as:  PEPCID Take 1 tablet (40 mg total) by mouth 2 (two) times daily. What changed:  when to take this  reasons to take this   FLUoxetine 20 MG capsule Commonly known as:  PROZAC Take 1 capsule (20 mg total) by mouth 2 (two) times daily.   metoprolol tartrate 25 MG tablet Commonly known as:  LOPRESSOR Take 0.5 tablets (12.5 mg total) by mouth 2 (two) times daily.   MULTIVITAMIN ADULTS 50+ PO Take 0.5 tablets by mouth daily.   predniSONE 20 MG tablet Commonly known as:   DELTASONE Take 1 tablet (20 mg total) by mouth daily with breakfast.   VITAMIN B-12 IJ Inject 1 mL as directed every 30 (thirty) days.   zolpidem 10 MG tablet Commonly known as:  AMBIEN Take 1 tablet (10 mg total) by mouth at bedtime as needed for sleep.      Follow-up Information    Rosalita Chessman Chase, DO. Schedule an appointment as soon as possible for a visit in 1 week(s).   Specialty:  Family Medicine Contact information: [address] [place] [address] [phone]        Larae Grooms, MD. Schedule an appointment as soon as possible for a visit.   Specialties:  Cardiology, Radiology, Interventional Cardiology Why:  Atrial fibrillation Contact information: [address] [place] [address] [phone]          Allergies  Allergen Reactions  . Codeine Nausea And Vomiting  . Mesalamine Nausea And Vomiting  . Other Itching    PERFUMED LOTIONS  Cant have MRI due to ear implant   . Oxycodone-Acetaminophen Rash    Itching & rash Tolerates Dilaudid  . Sulfa Antibiotics Rash    Consultations:  General surgery  Gastroenterology  Cardiology   Procedures/Studies: Dg Abd 1 View  Result Date: 06/21/2016 CLINICAL DATA:  Follow-up abdominal distension and pain. Patient with inflammatory  bowel disease and history of prior bowel surgery. EXAM: ABDOMEN - 1 VIEW COMPARISON:  06/20/2016 FINDINGS: The tip of an NG tube is identified overlying mid stomach. Decreased small bowel distention noted with only a single loop of gas-filled small bowel within the upper left abdomen, upper limits of normal in caliber. Evidence of right-sided ostomy noted. IMPRESSION: Decreased small bowel gas/distention as discussed. Electronically Signed   By: Margarette Canada M.D.   On: 06/21/2016 09:20   Ct Abdomen Pelvis W Contrast  Result Date: 06/17/2016 CLINICAL DATA:  76 year old female with lower abdominal pain. History of inflammatory bowel  disease with prior diverting colostomy and rectal fistula. EXAM: CT ABDOMEN AND PELVIS WITH CONTRAST TECHNIQUE: Multidetector CT imaging of the abdomen and pelvis was performed using the standard protocol following bolus administration of intravenous contrast. CONTRAST:  100 cc Isovue-300 COMPARISON:  Abdominal radiograph dated 02/23/2016 and CT dated 01/21/2016 FINDINGS: Lower chest: Minimal bibasilar dependent atelectatic changes of the lungs. The visualized lung bases are otherwise clear. No intra-abdominal free air. Diffuse mesenteric edema and small free fluid. Hepatobiliary: No focal liver abnormality is seen. No gallstones, gallbladder wall thickening, or biliary dilatation. Pancreas: Unremarkable. No pancreatic ductal dilatation or surrounding inflammatory changes. Spleen: Normal in size without focal abnormality. Adrenals/Urinary Tract: Adrenal glands are unremarkable. Kidneys are normal, without renal calculi, focal lesion, or hydronephrosis. Bladder is unremarkable. Stomach/Bowel: There is postsurgical changes of resection of the terminal ileum with the right lower quadrant diverting ileostomy. There is mild diffuse prominence of fluid-filled loops of mid to distal small bowel extending to the level of the ileostomy compatible with low grade or early obstruction. The transition zone is at the ileostomy likely related to underlying adhesion or stricture. The colon is collapsed. Diffuse thickened appearance of the colon likely related to underdistention. Vascular/Lymphatic: There is mild aortoiliac atherosclerotic disease. The origins of the celiac axis, SMA, IMA as well as the origins of the renal arteries are patent. Nonocclusive scar noted at the origin of the SMA. The IVC is unremarkable. The SMV, splenic vein, and main portal vein are patent. No portal venous gas identified. There is no adenopathy. Reproductive: Hysterectomy. Other: Bilateral breast implants. Musculoskeletal: Degenerative changes of  the spine. No acute fracture. IMPRESSION: 1. Postsurgical changes of bowel resection at the level of the terminal with a right lower quadrant ileostomy. There is mild dilatation of the small bowel extending to the ileostomy compatible with an early or low grade obstruction likely related to adhesion or stricture at the ileostomy. Clinical correlation and follow-up recommended. 2. Diffusely thickened colon likely related to underdistention. 3. Diffuse mesenteric edema and small ascites. Electronically Signed   By: Anner Crete M.D.   On: 06/17/2016 07:17   Dg Chest Port 1 View  Result Date: 06/21/2016 CLINICAL DATA:  Chest pain.  History of breast cancer EXAM: PORTABLE CHEST 1 VIEW COMPARISON:  06/19/2016 FINDINGS: Normal heart size. No pleural effusion or edema. There is no airspace opacities identified. Visualized osseous structures appear intact. IMPRESSION: No acute cardiopulmonary abnormalities. Electronically Signed   By: Kerby Moors M.D.   On: 06/21/2016 13:02   Dg Abd 2 Views  Result Date: 06/20/2016 CLINICAL DATA:  Small bowel obstruction. EXAM: ABDOMEN - 2 VIEW COMPARISON:  06/19/2016 FINDINGS: Enteric tube terminates in the proximal stomach with side hole near the GE junction. There is no evidence of intraperitoneal free air. Small bowel dilatation has overall improved from the prior study, however a few mildly dilated loops of gas-filled bowel remain  in the predominantly left half of the abdomen. An ostomy is noted in the right lower quadrant. Mild left basilar airspace opacity has mildly improved. IMPRESSION: 1. Decreasing small bowel dilatation suggestive of improving obstruction. 2. Mildly improved left lung base aeration. Electronically Signed   By: Logan Bores M.D.   On: 06/20/2016 08:38   Dg Abd 2 Views  Result Date: 06/19/2016 CLINICAL DATA:  Small-bowel obstruction.  Ulcerative colitis EXAM: ABDOMEN - 2 VIEW COMPARISON:  06/19/2016 FINDINGS: Small bowel dilatation with multiple  air-fluid levels consistent with small bowel obstruction. No significant change from earlier today. Contrast in the stomach and small bowel from CT. No significant colonic contrast identified. Colon decompressed. No free air on the upright view. IMPRESSION: Small-bowel obstruction pattern unchanged. Contrast does not reach the colon. No free air. Electronically Signed   By: Franchot Gallo M.D.   On: 06/19/2016 13:31   Dg Abd 2 Views  Result Date: 06/18/2016 CLINICAL DATA:  Recent small bowel obstruction EXAM: ABDOMEN - 2 VIEW COMPARISON:  CT abdomen and pelvis June 17, 2016 FINDINGS: Supine and upright images were obtained. There are multiple loops of borderline dilated small bowel with scattered air-fluid levels. There is a right side ostomy. No free air. Visualized lung bases are clear. IMPRESSION: Findings felt to represent a degree of persistent bowel obstruction. No evident free air. Electronically Signed   By: Lowella Grip III M.D.   On: 06/18/2016 12:48   Dg Abd Acute W/chest  Result Date: 06/19/2016 CLINICAL DATA:  76 year old female with small bowel obstruction and pneumoperitoneum. Follow-up radiograph. EXAM: DG ABDOMEN ACUTE W/ 1V CHEST COMPARISON:  Abdominal radiograph dated 06/19/2016 and CT dated 06/17/2016 FINDINGS: Left lung base subsegmental atelectasis. The lungs are otherwise clear. No pleural effusion or pneumothorax. Top-normal cardiac size. Multiple surgical clips noted in the left breast. Persistent dilatation of loops of small bowel measuring up to 3.5 cm in caliber. Oral contrast noted within the stomach and proximal loops of small bowel. The stomach is distended and contains the majority of the contrast. There is dilution of the oral contrast with fluid and air in the distal small bowel. There is a right lower quadrant ileostomy. There is contrast in the small bowel above the ostomy likely within a distal loop. Findings may represent ileus versus a low grade obstruction at  the ileostomy. No definite free air identified. There is degenerative changes of the spine.  No acute fracture. IMPRESSION: 1. Persistent diffuse dilatation of small bowel to the level of the ileostomy may represent an ileus versus low grade obstruction at the ostomy. Small amount of contrast noted in the distal small bowel. 2. No definite pneumoperitoneum. 3. No acute cardiopulmonary process. Electronically Signed   By: Anner Crete M.D.   On: 06/19/2016 03:46   Dg Abd Portable 1v  Result Date: 06/19/2016 CLINICAL DATA:  Nasogastric tube placement.  Initial encounter. EXAM: PORTABLE ABDOMEN - 1 VIEW COMPARISON:  Abdominal radiograph performed earlier today at 1:04 p.m. FINDINGS: The patient's enteric tube is noted ending overlying the body of the stomach. The stomach is partially filled with air. Distended small bowel loops are similar in appearance to the prior study, concerning for small bowel obstruction. No free intra-abdominal air is seen, though evaluation for free air is limited on a single supine view. No acute osseous abnormalities are identified. Mild left basilar airspace opacity raises question for mild pneumonia. IMPRESSION: 1. Enteric tube noted ending overlying the body of the stomach. 2. Distended small bowel  loops again noted, concerning for small obstruction. 3. Mild left basilar airspace opacity raises question for mild pneumonia, new from the prior study. Electronically Signed   By: Garald Balding M.D.   On: 06/19/2016 19:21   Dg Abd Portable 1v-small Bowel Obstruction Protocol-initial, 8 Hr Delay  Result Date: 06/19/2016 CLINICAL DATA:  Small bowel obstruction EXAM: PORTABLE ABDOMEN - 1 VIEW COMPARISON:  Abdominal radiograph 06/18/2016 FINDINGS: There is a large amount of free air within the abdomen. Multiple dilated loops of small bowel are again seen. There is enteric contrast within the proximal small bowel but no transit to the colon or distal small bowel. IMPRESSION: Large  volume pneumoperitoneum. If this is inconsistent with the clinical exam, a lateral decubitus radiograph could be obtained for confirmation. Critical Value/emergent results were called by telephone at the time of interpretation on 06/19/2016 at 2:07 am to K. Baltazar Najjar who verbally acknowledged these results. Electronically Signed   By: Ulyses Jarred M.D.   On: 06/19/2016 02:09     Echocardiogram (06/22/2016)  Study Conclusions  - Left ventricle: The cavity size was normal. Systolic function was   vigorous. The estimated ejection fraction was in the range of 65%   to 70%. Wall motion was normal; there were no regional wall   motion abnormalities. Features are consistent with a pseudonormal   left ventricular filling pattern, with concomitant abnormal   relaxation and increased filling pressure (grade 2 diastolic   dysfunction). Doppler parameters are consistent with high   ventricular filling pressure. - Aortic valve: There was mild regurgitation. - Atrial septum: There was increased thickness of the septum,   consistent with lipomatous hypertrophy.   Subjective: Patient reports no nausea, vomiting or abdominal pain.  Discharge Exam: Vitals:   06/24/16 0422 06/24/16 0900  BP: 95/71 101/60  Pulse: (!) 54 66  Resp: 18 18  Temp: 97.9 F (36.6 C) 97.8 F (36.6 C)   Vitals:   06/23/16 1300 06/23/16 2027 06/24/16 0422 06/24/16 0900  BP: 94/60 119/72 95/71 101/60  Pulse: 61 (!) 56 (!) 54 66  Resp: 20  18 18   Temp: 98.2 F (36.8 C) 97.1 F (36.2 C) 97.9 F (36.6 C) 97.8 F (36.6 C)  TempSrc: Oral Oral Oral Oral  SpO2: 98% 96% 97% 94%  Weight:      Height:        General exam: Appears calm and comfortable Respiratory system: Clear to auscultation. Respiratory effort normal. Cardiovascular system: S1 & S2 heard, RRR. No murmurs. Gastrointestinal system: Abdomen is nondistended, soft and non-tender. Bowel sounds. Liquid output from ostomy. Central nervous system: Alert and  oriented. Difficulty hearing. Extremities: No edema. No calf tenderness Skin: No cyanosis. No rashes Psychiatry: Judgement and insight appear normal. Mood & affect slightly anxious and normal.    The results of significant diagnostics from this hospitalization (including imaging, microbiology, ancillary and laboratory) are listed below for reference.     Microbiology: Recent Results (from the past 240 hour(s))  Urine culture     Status: Abnormal   Collection Time: 06/17/16  6:15 AM  Result Value Ref Range Status   Specimen Description URINE, CLEAN CATCH  Final   Special Requests NONE  Final   Culture (A)  Final    <10,000 COLONIES/mL INSIGNIFICANT GROWTH Performed at [facility], [address], [place], [place] [address]    Report Status 06/18/2016 FINAL  Final  Culture, blood (Routine X 2) w Reflex to ID Panel     Status:  None   Collection Time: 06/17/16  9:52 AM  Result Value Ref Range Status   Specimen Description BLOOD RIGHT ARM  Final   Special Requests BOTTLES DRAWN AEROBIC AND ANAEROBIC 5CC  Final   Culture   Final    NO GROWTH 5 DAYS Performed at [facility], [place] [address], [place], [place] [address]    Report Status 06/22/2016 FINAL  Final  Culture, blood (Routine X 2) w Reflex to ID Panel     Status: None   Collection Time: 06/17/16  9:58 AM  Result Value Ref Range Status   Specimen Description BLOOD RIGHT HAND  Final   Special Requests IN PEDIATRIC BOTTLE 3CC  Final   Culture   Final    NO GROWTH 5 DAYS Performed at [facility], [place] [address], [place], [place] [address]    Report Status 06/22/2016 FINAL  Final     Labs: Basic Metabolic Panel:  Recent Labs Lab 06/18/16 0635 06/20/16 0416 06/21/16 0453 06/22/16 1107  NA 137 138 131* 137  K 4.1 4.4 5.2* 3.7  CL 111 112* 106 108  CO2 20* 18* 19* 19*  GLUCOSE 80 80 65 103*  BUN 12 22* 14 15  CREATININE 0.78 0.92 0.61 0.62  CALCIUM 9.3 9.1 8.3* 9.0   Liver Function  Tests:  Recent Labs Lab 06/20/16 0416  AST 14*  ALT 12*  ALKPHOS 52  BILITOT 1.1  PROT 6.6  ALBUMIN 3.6   CBC:  Recent Labs Lab 06/18/16 0635 06/20/16 0416 06/21/16 0453  WBC 10.3 15.5* 11.9*  NEUTROABS  --   --  9.3*  HGB 15.5* 14.5 12.6  HCT 45.6 44.7 38.1  MCV 90.7 92.9 91.4  PLT 254 255 218   Cardiac Enzymes:  Recent Labs Lab 06/21/16 1140  TROPONINI 0.03*   Urinalysis    Component Value Date/Time   COLORURINE YELLOW 06/17/2016 0615   APPEARANCEUR CLEAR 06/17/2016 0615   LABSPEC 1.016 06/17/2016 0615   PHURINE 5.0 06/17/2016 0615   GLUCOSEU NEGATIVE 06/17/2016 0615   HGBUR SMALL (A) 06/17/2016 0615   HGBUR small 12/13/2009 0904   BILIRUBINUR NEGATIVE 06/17/2016 0615   BILIRUBINUR ne 01/30/2015 1543   KETONESUR NEGATIVE 06/17/2016 0615   PROTEINUR NEGATIVE 06/17/2016 0615   UROBILINOGEN 0.2 01/30/2015 1543   UROBILINOGEN 0.2 09/25/2013 1350   NITRITE NEGATIVE 06/17/2016 0615   LEUKOCYTESUR NEGATIVE 06/17/2016 0615   Microbiology Recent Results (from the past 240 hour(s))  Urine culture     Status: Abnormal   Collection Time: 06/17/16  6:15 AM  Result Value Ref Range Status   Specimen Description URINE, CLEAN CATCH  Final   Special Requests NONE  Final   Culture (A)  Final    <10,000 COLONIES/mL INSIGNIFICANT GROWTH Performed at [facility], [address], [place], [place] [address]    Report Status 06/18/2016 FINAL  Final  Culture, blood (Routine X 2) w Reflex to ID Panel     Status: None   Collection Time: 06/17/16  9:52 AM  Result Value Ref Range Status   Specimen Description BLOOD RIGHT ARM  Final   Special Requests BOTTLES DRAWN AEROBIC AND ANAEROBIC 5CC  Final   Culture   Final    NO GROWTH 5 DAYS Performed at [facility], [place] [address], [place], [place] [address]    Report Status 06/22/2016 FINAL  Final  Culture, blood (Routine X 2) w Reflex to ID Panel  Status: None   Collection Time: 06/17/16  9:58 AM  Result  Value Ref Range Status   Specimen Description BLOOD RIGHT HAND  Final   Special Requests IN PEDIATRIC BOTTLE 3CC  Final   Culture   Final    NO GROWTH 5 DAYS Performed at [facility], [address] [place], [place] [address]    Report Status 06/22/2016 FINAL  Final     Time coordinating discharge: Over 30 minutes  SIGNED:   Cordelia Poche, MD [facility] 06/24/2016, 11:28 AM Pager [phone]  If 7PM-7AM, please contact night-coverage [email or web address] Password [email or web address]

## 2016-06-24 NOTE — Progress Notes (Signed)
Patient ID: Sara Barnes, female   DOB: [birth year], 76 y.o.   MRN: [id number]       Subjective: Feels well. No abdominal complaints and tolerating diet. Up and fully dressed and anxious to go home.  Objective: Vital signs in last 24 hours: Temp:  [97.1 F (36.2 C)-98.2 F (36.8 C)] 97.8 F (36.6 C) (02/27 0900) Pulse Rate:  [54-66] 66 (02/27 0900) Resp:  [18-20] 18 (02/27 0900) BP: (94-119)/(60-72) 101/60 (02/27 0900) SpO2:  [94 %-98 %] 94 % (02/27 0900) Last BM Date: 06/23/16  Intake/Output from previous day: 02/26 0701 - 02/27 0700 In: 530 [P.O.:480; IV Piggyback:50] Out: 490 [Stool:490] Intake/Output this shift: Total I/O In: 480 [P.O.:480] Out: -   General appearance: alert, cooperative, no distress and I did not reexamine her abdomen  Lab Results:  No results for input(s): WBC, HGB, HCT, PLT in the last 72 hours. BMET  Recent Labs  06/22/16 1107  NA 137  K 3.7  CL 108  CO2 19*  GLUCOSE 103*  BUN 15  CREATININE 0.62  CALCIUM 9.0     Studies/Results: No results found.  Anti-infectives: Anti-infectives    None      Assessment/Plan: Partial small bowel obstruction following ileostomy that has clinically completely resolved. Okay for discharge. She has follow-up appointments with Dr. Hassell Done in our office and also with GI medicine.    LOS: 7 days    Sara Barnes T 06/24/2016

## 2016-06-24 NOTE — Progress Notes (Signed)
Patient discharged @ 1259 in stable condition.  Educated pt regarding new medication, monitoring HR and BP.  Prescription given.  Discharge instructions given.  Teach back completed.

## 2016-06-24 NOTE — Discharge Instructions (Signed)
Sara Barnes,  Your admitted because of your abdominal pain anywhere found to have a small bowel obstruction. This was managed with bowel rest and NG tube. His symptoms over time improved. Her diet was advanced as tolerated and you have done well with no nausea or vomiting and no belly pain. While you're here, he had an episode of atrial fibrillation which went away after I gave you a beta blocker DIP. The cardiologists reviewed your chart and discussed with you the risks and benefits of starting anticoagulation. Please follow-up with them as an outpatient to continue discussions for anticoagulation and to manage your heart arrhythmia. Please follow-up with your primary care physician and surgeons. It was a pleasure taking care of you.  Sincerely, Cordelia Poche, M.D. [facility]

## 2016-06-24 NOTE — Progress Notes (Signed)
Progress Note  Patient Name: Sara Barnes Date of Encounter: 06/24/2016  Primary Cardiologist: Irish Lack  Subjective   Feeling well this morning. Anxious to go home.   Inpatient Medications    Scheduled Meds: . chlorhexidine  15 mL Mouth Rinse BID  . enoxaparin (LOVENOX) injection  40 mg Subcutaneous Q24H  . famotidine  20 mg Oral BID  . FLUoxetine  20 mg Oral Daily  . lidocaine  15 mL Mouth/Throat Once  . lidocaine-EPINEPHrine-tetracaine  3 mL Topical Once  . mouth rinse  15 mL Mouth Rinse q12n4p  . metoprolol tartrate  12.5 mg Oral BID  . predniSONE  20 mg Oral Q breakfast   Continuous Infusions:  PRN Meds: acetaminophen **OR** acetaminophen, HYDROmorphone (DILAUDID) injection, LORazepam, ondansetron **OR** ondansetron (ZOFRAN) IV, phenol, zolpidem   Vital Signs    Vitals:   06/23/16 0441 06/23/16 1300 06/23/16 2027 06/24/16 0422  BP: 139/68 94/60 119/72 95/71  Pulse: (!) 49 61 (!) 56 (!) 54  Resp: 20 20  18   Temp: 98.1 F (36.7 C) 98.2 F (36.8 C) 97.1 F (36.2 C) 97.9 F (36.6 C)  TempSrc: Oral Oral Oral Oral  SpO2: 98% 98% 96% 97%  Weight:      Height:        Intake/Output Summary (Last 24 hours) at 06/24/16 0944 Last data filed at 06/24/16 0300  Gross per 24 hour  Intake              530 ml  Output              490 ml  Net               40 ml   Filed Weights   06/17/16 0933 06/21/16 1630  Weight: 140 lb (63.5 kg) 136 lb 0.4 oz (61.7 kg)    Telemetry    SR, episodes of SB in the evening. - Personally Reviewed  ECG    N/A - Personally Reviewed  Physical Exam   General: Well developed, well nourished, female appearing in no acute distress. Head: Normocephalic, atraumatic.  Neck: Supple without bruits, JVD. Lungs:  Resp regular and unlabored, CTA. Heart: RRR, S1, S2, no S3, S4, or murmur; no rub. Abdomen: Soft, non-tender, non-distended with normoactive bowel sounds. No hepatomegaly. No rebound/guarding. No obvious abdominal  masses. Extremities: No clubbing, cyanosis, edema. Distal pedal pulses are 2+ bilaterally. Neuro: Alert and oriented X 3. Moves all extremities spontaneously. Psych: Normal affect.  Labs    Chemistry Recent Labs Lab 06/20/16 0416 06/21/16 0453 06/22/16 1107  NA 138 131* 137  K 4.4 5.2* 3.7  CL 112* 106 108  CO2 18* 19* 19*  GLUCOSE 80 65 103*  BUN 22* 14 15  CREATININE 0.92 0.61 0.62  CALCIUM 9.1 8.3* 9.0  PROT 6.6  --   --   ALBUMIN 3.6  --   --   AST 14*  --   --   ALT 12*  --   --   ALKPHOS 52  --   --   BILITOT 1.1  --   --   GFRNONAA 60* >60 >60  GFRAA >60 >60 >60  ANIONGAP 8 6 10      Hematology Recent Labs Lab 06/18/16 0635 06/20/16 0416 06/21/16 0453  WBC 10.3 15.5* 11.9*  RBC 5.03 4.81 4.17  HGB 15.5* 14.5 12.6  HCT 45.6 44.7 38.1  MCV 90.7 92.9 91.4  MCH 30.8 30.1 30.2  MCHC 34.0 32.4 33.1  RDW 15.6*  15.6* 14.9  PLT 254 255 218    Cardiac Enzymes Recent Labs Lab 06/21/16 1140  TROPONINI 0.03*   No results for input(s): TROPIPOC in the last 168 hours.   BNPNo results for input(s): BNP, PROBNP in the last 168 hours.   DDimer No results for input(s): DDIMER in the last 168 hours.    Radiology    No results found.  Cardiac Studies   TTE: 2/25  Study Conclusions  - Left ventricle: The cavity size was normal. Systolic function was   vigorous. The estimated ejection fraction was in the range of 65%   to 70%. Wall motion was normal; there were no regional wall   motion abnormalities. Features are consistent with a pseudonormal   left ventricular filling pattern, with concomitant abnormal   relaxation and increased filling pressure (grade 2 diastolic   dysfunction). Doppler parameters are consistent with high   ventricular filling pressure. - Aortic valve: There was mild regurgitation. - Atrial septum: There was increased thickness of the septum,   consistent with lipomatous hypertrophy.  Patient Profile     76 y.o. female with  complex GI history including Crohn's disease with multiple abscesses, perirectal fissures, followed by [facility] who presented with SBO and had transient AF RVR.   Assessment & Plan    1. Transient Atrial Fibrillation: first noted 06/21/16 and, per records, returned to [facility] with one dose of IV metoprolol. No prior h/o afib/flutter documented . Her CHA2DS2 VASc score is 2 for female sex and age 65-74. Echo shows normal LVEF. -- started low dose metoprolol yesterday, which was held last evening due to HR in the 50s. No OAC at this time as she prefers to wait, and reconsider if recurrent episodes occur.  -- would benefit from daily ASA 62m given ChadsVasc score.  2. SBO/ other GI issues: per general surgery and internal medicine .    Signed, LReino Bellis NP  06/24/2016, 9:44 AM    JJettie Booze MD

## 2016-06-25 ENCOUNTER — Telehealth: Payer: Self-pay | Admitting: Behavioral Health

## 2016-06-25 NOTE — Telephone Encounter (Signed)
Attempted to reach patient for TCM/Hospital Follow-up call. Left message for patient to return call when available.    

## 2016-06-26 NOTE — Telephone Encounter (Signed)
Left message x 2 for TCM/Hospital Follow-up call.

## 2016-07-03 DIAGNOSIS — K529 Noninfective gastroenteritis and colitis, unspecified: Secondary | ICD-10-CM | POA: Diagnosis not present

## 2016-07-03 DIAGNOSIS — K603 Anal fistula: Secondary | ICD-10-CM | POA: Diagnosis not present

## 2016-07-03 DIAGNOSIS — K50113 Crohn's disease of large intestine with fistula: Secondary | ICD-10-CM | POA: Diagnosis not present

## 2016-07-03 DIAGNOSIS — K610 Anal abscess: Secondary | ICD-10-CM | POA: Diagnosis not present

## 2016-07-11 DIAGNOSIS — K5090 Crohn's disease, unspecified, without complications: Secondary | ICD-10-CM | POA: Diagnosis not present

## 2016-07-11 DIAGNOSIS — Z932 Ileostomy status: Secondary | ICD-10-CM | POA: Diagnosis not present

## 2016-07-15 ENCOUNTER — Other Ambulatory Visit: Payer: Self-pay | Admitting: Surgical Oncology

## 2016-07-15 DIAGNOSIS — K611 Rectal abscess: Secondary | ICD-10-CM

## 2016-07-15 DIAGNOSIS — K529 Noninfective gastroenteritis and colitis, unspecified: Secondary | ICD-10-CM

## 2016-07-16 ENCOUNTER — Ambulatory Visit
Admission: RE | Admit: 2016-07-16 | Discharge: 2016-07-16 | Disposition: A | Discharge status: Home / Self Care | Payer: Medicare HMO | Source: Ambulatory Visit | Attending: Surgical Oncology | Admitting: Surgical Oncology

## 2016-07-16 DIAGNOSIS — K529 Noninfective gastroenteritis and colitis, unspecified: Secondary | ICD-10-CM | POA: Diagnosis not present

## 2016-07-16 DIAGNOSIS — K611 Rectal abscess: Secondary | ICD-10-CM

## 2016-07-16 MED ORDER — IOPAMIDOL (ISOVUE-300) INJECTION 61%
100.0000 mL | Freq: Once | INTRAVENOUS | Status: AC | PRN
  Administered 2016-07-16: 100 mL via INTRAVENOUS

## 2016-07-17 ENCOUNTER — Ambulatory Visit: Payer: Medicare HMO | Admitting: Physician Assistant

## 2016-07-17 ENCOUNTER — Inpatient Hospital Stay: Admission: RE | Admit: 2016-07-17 | Payer: Medicare HMO | Source: Ambulatory Visit

## 2016-07-21 DIAGNOSIS — K610 Anal abscess: Secondary | ICD-10-CM | POA: Diagnosis not present

## 2016-07-21 DIAGNOSIS — K611 Rectal abscess: Secondary | ICD-10-CM | POA: Diagnosis not present

## 2016-07-21 DIAGNOSIS — K612 Anorectal abscess: Secondary | ICD-10-CM | POA: Diagnosis not present

## 2016-07-21 DIAGNOSIS — K604 Rectal fistula: Secondary | ICD-10-CM | POA: Diagnosis not present

## 2016-07-28 ENCOUNTER — Telehealth: Payer: Self-pay | Admitting: *Deleted

## 2016-07-28 NOTE — Telephone Encounter (Signed)
I have spoken to patient to advise that Dr Per Dr Sheryn Bison [facility]) and Dr Vena Rua recent conversation, Dr Sheryn Bison requested to see patient back at her office prior to starting any new medications etc.   Patient has been rescheduled to see Dr Hilarie Fredrickson on 09/03/16, after her appointment with Dr Sheryn Bison on 08/21/16. She verbalizes understanding.

## 2016-07-29 ENCOUNTER — Ambulatory Visit: Payer: Medicare HMO | Admitting: Internal Medicine

## 2016-07-29 ENCOUNTER — Other Ambulatory Visit: Payer: Self-pay | Admitting: Internal Medicine

## 2016-07-29 DIAGNOSIS — K51519 Left sided colitis with unspecified complications: Secondary | ICD-10-CM

## 2016-08-01 ENCOUNTER — Telehealth: Payer: Self-pay | Admitting: Interventional Cardiology

## 2016-08-01 MED ORDER — METOPROLOL TARTRATE 25 MG PO TABS: 12.5000 mg | ORAL_TABLET | Freq: Two times a day (BID) | ORAL | 0 refills | Status: DC

## 2016-08-01 NOTE — Telephone Encounter (Signed)
New Message    *STAT* If patient is at the pharmacy, call can be transferred to refill team.   1. Which medications need to be refilled? (please list name of each medication and dose if known) metoprolol tartrate (LOPRESSOR) 25 MG tablet  2. Which pharmacy/location (including street and city if local pharmacy) is medication to be sent to? [id number] - [place], [place]  3. Do they need a 30 day or 90 day supply? 30 day supply

## 2016-08-05 ENCOUNTER — Ambulatory Visit: Payer: Medicare HMO | Admitting: Physician Assistant

## 2016-08-05 ENCOUNTER — Encounter: Payer: Self-pay | Admitting: Physician Assistant

## 2016-08-05 ENCOUNTER — Ambulatory Visit (INDEPENDENT_AMBULATORY_CARE_PROVIDER_SITE_OTHER): Payer: Medicare HMO | Admitting: Physician Assistant

## 2016-08-05 VITALS — BP 132/76 | HR 65 | Ht 66.0 in | Wt 143.0 lb

## 2016-08-05 DIAGNOSIS — I5032 Chronic diastolic (congestive) heart failure: Secondary | ICD-10-CM

## 2016-08-05 DIAGNOSIS — I4891 Unspecified atrial fibrillation: Secondary | ICD-10-CM

## 2016-08-05 MED ORDER — METOPROLOL TARTRATE 25 MG PO TABS: 25.0000 mg | ORAL_TABLET | ORAL | Status: DC

## 2016-08-05 NOTE — Progress Notes (Signed)
Cardiology Office Note    Date:  08/05/2016   ID:  Sara Barnes, DOB 04/01/1941, MRN [id number]  PCP:  Ann Held, DO  Cardiologist: Dr. Irish Lack  Chief Complaint  Patient presents with  . Follow-up    History of Present Illness:  Sara Barnes is a 76 y.o. female  with complex GI history including Crohn's disease with multiple abscesses, perirectal fissures, followed by [facility] as well as [facility] s/p laparoscopic diverting ileostomy 01/2016. She presented 06/17/16 with complaint of n/v and abdominal pain. There was concern for IBD flare initially, but now appears to be likely secondary to bowel obstruction. Hospital course complicated by atrial fibrillation w/ RVRThat converted to normal sinus rhythm with one dose of IV metoprolol. for which cardiology has been consulted. Patient remained in normal sinus rhythm the rest of her hospital course. CHADSVASC=2 for age and female sex. 2-D echo normal LVEF 65-70% with no wall motion abnormalities and grade 2 DD, mild AI. Atrial fibrillation was felt secondary to stress of the illness. Anticoagulation was discussed and patient preferred to wait and see if she had recurrence. Dr. Irish Lack felt that this was reasonable and sent home on low dose metoprolol.  Patient comes in today feeling quite well. She has had no further palpitations or rapid heart rates. She actually had an outpatient procedure by Dr. Sheryn Bison and tolerated it well. She would like to stop the metoprolol.     Past Medical History:  Diagnosis Date  . Allergy   . Anemia   . Basal cell carcinoma   . Blood transfusion without reported diagnosis   . Cancer of lower-inner quadrant of female breast ([facility]) 11/30/2014   left  . Cancer of lower-inner quadrant of left female breast ([facility]) 11/30/2014  . Complication of anesthesia    anxiety post-op after ear surgeries  . Crohn's disease of large intestine with abscess ([facility])   . Dental crowns present    recent root canal 11/2014    . Depression   . Family history of adverse reaction to anesthesia    pt's sister has hx. of post-op N/V  . Indigestion   . PONV (postoperative nausea and vomiting)   . Runny nose 12/07/2014   clear drainage, per pt.  . Seasonal allergies   . Ulcerative colitis   . Vitamin B12 deficiency     Past Surgical History:  Procedure Laterality Date  . ABDOMINAL HYSTERECTOMY     partial  . APPENDECTOMY    . BASAL CELL CARCINOMA EXCISION Left    nose  . BREAST ENHANCEMENT SURGERY    . BREAST LUMPECTOMY Left    age 40's  . BREAST LUMPECTOMY WITH RADIOACTIVE SEED AND SENTINEL LYMPH NODE BIOPSY Left 12/13/2014   Procedure: LEFT BREAST LUMPECTOMY WITH RADIOACTIVE SEED AND SENTINEL LYMPH NODE BIOPSY;  Surgeon: [name], MD;  Location: [facility];  Service: General;  Laterality: Left;  . COLONOSCOPY WITH PROPOFOL  05/28/2011; 06/28/2014  . FLEXIBLE SIGMOIDOSCOPY N/A 01/08/2016   Procedure: FLEXIBLE SIGMOIDOSCOPY;  Surgeon: [name], MD;  Location: [facility];  Service: Gastroenterology;  Laterality: N/A;  needs MAC due to pain and anxiety  . INCISION AND DRAINAGE PERIRECTAL ABSCESS N/A 09/28/2013   Procedure: IRRIGATION AND DEBRIDEMENT PERIANAL ABSCESS, proctoscopy;  Surgeon: Shann Medal, MD;  Location: [facility];  Service: General;  Laterality: N/A;  . INCISION AND DRAINAGE PERIRECTAL ABSCESS N/A 01/22/2016   Procedure: IRRIGATION AND DEBRIDEMENT PERIRECTAL ABSCESS;  Surgeon: Armandina Gemma,  MD;  Location: [facility];  Service: General;  Laterality: N/A;  . INNER EAR SURGERY Bilateral   . LAPAROSCOPIC DIVERTED COLOSTOMY N/A 02/11/2016   Procedure: LAPAROSCOPIC DIVERTED ILEOSTOMY;  Surgeon: Johnathan Hausen, MD;  Location: [facility];  Service: General;  Laterality: N/A;    Current Medications: Outpatient Medications Prior to Visit  Medication Sig Dispense Refill  . Cyanocobalamin (VITAMIN B-12 IJ) Inject 1 mL as directed every 30 (thirty) days.    . famotidine (PEPCID) 40 MG  tablet Take 1 tablet (40 mg total) by mouth 2 (two) times daily. (Patient taking differently: Take 40 mg by mouth daily as needed for heartburn or indigestion. )    . FLUoxetine (PROZAC) 20 MG capsule Take 1 capsule (20 mg total) by mouth 2 (two) times daily. 180 capsule 3  . predniSONE (DELTASONE) 20 MG tablet Take 1 tablet (20 mg total) by mouth daily with breakfast. 30 tablet 2  . zolpidem (AMBIEN) 10 MG tablet TAKE ONE TABLET BY MOUTH EVERY NIGHT AT BEDTIME AS NEEDEDFOR SLEEP 30 tablet 0  . metoprolol tartrate (LOPRESSOR) 25 MG tablet Take 0.5 tablets (12.5 mg total) by mouth 2 (two) times daily. 30 tablet 0  . Multiple Vitamins-Minerals (MULTIVITAMIN ADULTS 50+ PO) Take 0.5 tablets by mouth daily.     No facility-administered medications prior to visit.      Allergies:   Codeine; Mesalamine; Other; Oxycodone-acetaminophen; and Sulfa antibiotics   Social History   Social History  . Marital status: Widowed    Spouse name: N/A  . Number of children: 1  . Years of education: N/A   Occupational History  . 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 Smoking status: Never Smoker  . Smokeless tobacco: Never Used  . Alcohol use 0.0 oz/week     Comment: daily wine with dinner  . Drug use: No  . Sexual activity: Not Currently    Partners: Male   Other Topics Concern  . None   Social History Narrative   Treadmill/ walking--exercise     Family History:  The patient's family history includes Anesthesia problems in her sister; Asthma in her sister; Brain cancer in her father; Diabetes in her brother; Emphysema in her maternal grandfather; Kidney disease in her brother; Prostate cancer (age of onset: 64) in her brother; Rheum arthritis in her brother and mother.   ROS:   Please see the history of present illness.    Review of Systems  Constitution: Positive for weakness.  HENT: Negative.   Eyes: Negative.   Cardiovascular: Negative.   Respiratory:  Negative.   Hematologic/Lymphatic: Negative.   Musculoskeletal: Negative.  Negative for joint pain.  Gastrointestinal: Positive for abdominal pain.       Colostomy bag  Genitourinary: Negative.    All other systems reviewed and are negative.   PHYSICAL EXAM:   VS:  BP 132/76 (BP Location: Right Arm, Patient Position: Sitting, Cuff Size: Normal)   Pulse 65   Ht 5' 6"  (1.676 m)   Wt 143 lb (64.9 kg)   BMI 23.08 kg/m   Physical Exam  GEN: Well nourished, well developed, in no acute distress  Neck: no JVD, carotid bruits, or masses Cardiac:RRR; no murmurs, rubs, or gallops  Respiratory:  clear to auscultation bilaterally, normal work of breathing GI: soft, nontender, nondistended, + BS Ext: without cyanosis, clubbing, or edema, Good distal pulses bilaterally Psych: euthymic mood, full affect  Wt Readings from Last 3  Encounters:  08/05/16 143 lb (64.9 kg)  06/21/16 136 lb 0.4 oz (61.7 kg)  06/05/16 140 lb (63.5 kg)      Studies/Labs Reviewed:   EKG:  EKG is ordered today.  The ekg ordered today demonstrates Normal sinus rhythm at 65 bpm with poor R wave progression  Recent Labs: 02/25/2016: Magnesium 2.1 06/20/2016: ALT 12 06/21/2016: Hemoglobin 12.6; Platelets 218 06/22/2016: BUN 15; Creatinine, Ser 0.62; Potassium 3.7; Sodium 137   Lipid Panel    Component Value Date/Time   CHOL 198 01/30/2015 1043   TRIG 157 (H) 02/25/2016 0530   HDL 66.10 01/30/2015 1043   CHOLHDL 3 01/30/2015 1043   VLDL 21.8 01/30/2015 1043   LDLCALC 110 (H) 01/30/2015 1043   LDLDIRECT 108.6 01/26/2014 1143    Additional studies/ records that were reviewed today include:   Echocardiogram 2D Echo 06/22/16   Study Conclusions   - Left ventricle: The cavity size was normal. Systolic function was   vigorous. The estimated ejection fraction was in the range of 65%   to 70%. Wall motion was normal; there were no regional wall   motion abnormalities. Features are consistent with a pseudonormal    left ventricular filling pattern, with concomitant abnormal   relaxation and increased filling pressure (grade 2 diastolic   dysfunction). Doppler parameters are consistent with high   ventricular filling pressure. - Aortic valve: There was mild regurgitation. - Atrial septum: There was increased thickness of the septum,   consistent with lipomatous hypertrophy.         ASSESSMENT:    1. Atrial fibrillation with RVR ([facility])   2. Chronic diastolic CHF (congestive heart failure) ([facility])      PLAN:  In order of problems listed above:  Atrial fibrillation with RVR converted to normal sinus rhythm with one dose of IV metoprolol occurred in the setting of small bowel obstruction. CHADSVASC=2 age, female. Anticoagulation deferred unless recurrence. Patient is maintaining normal sinus rhythm and would like to stop metoprolol. I feel like this is reasonable. Vascular take a half a tablet once daily for 3 days and then she can stop completely. Follow-up with Dr. Irish Lack in 4-5 months.  Chronic diastolic heart failure LVEF 65-70% with grade 2 DD on 2-D echo. Mild chronic ankle edema. Recommend compression stockings.  Medication Adjustments/Labs and Tests Ordered: Current medicines are reviewed at length with the patient today.  Concerns regarding medicines are outlined above.  Medication changes, Labs and Tests ordered today are listed in the Patient Instructions below. Patient Instructions  Medication Instructions:  1. DECREASE METOPROLOL TARTRATE TO 1/2 TABLET ONCE A DAY FOR 3 DAYS THEN STOP  Labwork: NONE ORDERED   Testing/Procedures: NONE ORDERED  Follow-Up: DR. VARANASI IN 4-5 MONTHS  Any Other Special Instructions Will Be Listed Below (If Applicable).     If you need a refill on your cardiac medications before your next appointment, please call your pharmacy.      Sumner Boast, PA-C  08/05/2016 12:17 PM    [facility] [address],  [place], [place]  [address] Phone: [phone]; Fax: [phone]

## 2016-08-05 NOTE — Patient Instructions (Signed)
Medication Instructions:  1. DECREASE METOPROLOL TARTRATE TO 1/2 TABLET ONCE A DAY FOR 3 DAYS THEN STOP  Labwork: NONE ORDERED   Testing/Procedures: NONE ORDERED  Follow-Up: DR. VARANASI IN 4-5 MONTHS  Any Other Special Instructions Will Be Listed Below (If Applicable).     If you need a refill on your cardiac medications before your next appointment, please call your pharmacy.

## 2016-08-13 DIAGNOSIS — Z932 Ileostomy status: Secondary | ICD-10-CM | POA: Diagnosis not present

## 2016-08-13 DIAGNOSIS — K5090 Crohn's disease, unspecified, without complications: Secondary | ICD-10-CM | POA: Diagnosis not present

## 2016-08-19 ENCOUNTER — Encounter: Payer: Self-pay | Admitting: *Deleted

## 2016-08-21 DIAGNOSIS — K50113 Crohn's disease of large intestine with fistula: Secondary | ICD-10-CM | POA: Diagnosis not present

## 2016-09-03 ENCOUNTER — Ambulatory Visit (INDEPENDENT_AMBULATORY_CARE_PROVIDER_SITE_OTHER): Payer: Medicare HMO | Admitting: Internal Medicine

## 2016-09-03 ENCOUNTER — Encounter: Payer: Self-pay | Admitting: Internal Medicine

## 2016-09-03 ENCOUNTER — Other Ambulatory Visit: Payer: Medicare HMO

## 2016-09-03 VITALS — BP 122/62 | HR 82 | Ht 66.0 in | Wt 144.6 lb

## 2016-09-03 DIAGNOSIS — Z79899 Other long term (current) drug therapy: Secondary | ICD-10-CM | POA: Diagnosis not present

## 2016-09-03 DIAGNOSIS — F329 Major depressive disorder, single episode, unspecified: Secondary | ICD-10-CM | POA: Diagnosis not present

## 2016-09-03 DIAGNOSIS — K50118 Crohn's disease of large intestine with other complication: Secondary | ICD-10-CM

## 2016-09-03 DIAGNOSIS — G4700 Insomnia, unspecified: Secondary | ICD-10-CM | POA: Diagnosis not present

## 2016-09-03 DIAGNOSIS — K50111 Crohn's disease of large intestine with rectal bleeding: Secondary | ICD-10-CM

## 2016-09-03 DIAGNOSIS — K50113 Crohn's disease of large intestine with fistula: Secondary | ICD-10-CM

## 2016-09-03 DIAGNOSIS — F32A Depression, unspecified: Secondary | ICD-10-CM

## 2016-09-03 NOTE — Patient Instructions (Addendum)
Your physician has requested that you go to the basement for the lab work before leaving today.  We will be in touch with results and plans.   We are going to also be in touch about starting Humira.  Continue 61m of prednisone daily.  Follow up with Dr PHilarie Fredricksonon 10/30/16 at 10:00AM  I appreciate the opportunity to care for you.

## 2016-09-03 NOTE — Progress Notes (Signed)
Subjective:    Patient ID: Sara Barnes, female    DOB: [birth year], 76 y.o.   MRN: [id number]  HPI Sara Barnes is a 76 year old female with Crohn's colitis with Fistulizing perianal disease, status post end ileostomy who is here for follow-up. She was last seen in my clinic on 06/05/2016 and has been seen by Dr. Emelda Fear with [facility] and Kem Parkinson with [facility]. Dr. Sheryn Bison performed an exam under anesthesia in March 2018. 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 Per Dr. March Rummage communication the patient assisted in reversing her ostomy but Dr. Sheryn Bison recommended medical management before even consideration of reversing her ostomy.  She has made steady improvement. She continues to have uncontrollable leakage from her rectum. This is described as watery and wet. She wears a pad. She will also passed blood. She is not having abdominal pain. Her ostomy is working well. Occasionally she feels slight "cramping" in her rectum prior to passing loose stool-like material. She has tapered her prednisone down from 20 mg to 10 mg daily. She is noticed decrease in energy particularly with decreasing prednisone dose. She is eating well. No nausea or vomiting. She is using Prozac 20 mg alternating with 40 mg. Depressed mood continues to be a problem without SI or HI. She is using B12 injections monthly and famotidine when necessary.  Of note she was hospitalized for 7 days in February for a small bowel obstruction requiring NG tube decompression but not further surgical intervention. This was complicated by A. fib with RVR.  Review of Systems As per history of present illness, otherwise negative  Current Medications, Allergies, Past Medical History, Past Surgical History, Family History and Social History were reviewed in [employer] record.     Objective:   Physical Exam BP 122/62   Pulse 82   Ht  5' 6"  (1.676 m)   Wt 144 lb 9.6 oz (65.6 kg)   BMI 23.34 kg/m  Constitutional: Well-developed and well-nourished. No distress. HEENT: Normocephalic and atraumatic. Oropharynx is clear and moist. Conjunctivae are normal.  No scleral icterus. Neck: Neck supple. Trachea midline. Cardiovascular: Normal rate, regular rhythm and intact distal pulses.  Pulmonary/chest: Effort normal and breath sounds normal. No wheezing, rales or rhonchi. Abdominal: Soft, nontender, nondistended. Bowel sounds active throughout. End ileostomy right lower quadrant normal appearing Extremities: no clubbing, cyanosis, or edema Neurological: Alert and oriented to person place and time. Skin: Skin is warm and dry. Psychiatric: Normal mood and affect. Behavior is normal.   CT-Enterography 07/16/2016  IMPRESSION: 1. No mucosal irregularity, stricture or inflammation of the small bowel. RIGHT lower quadrant ileostomy. 2. Colon is predominantly collapse. No evidence of inflammation or infection. 3. Perianal tissue demonstrates chronic linear fistula tracts extending posteriorly on the LEFT and RIGHT (intersphincteric fistulous). No evidence of active fistulization. 4. Evidence of potential residual transsphincteric fistula on the LEFT.  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  1. Crohn's colitis with perianal/fistulizing disease/status post end ileostomy -- I appreciate  both Dr.  Sheryn Bison and Dr. Bari Mantis involvement and opinions. It is been a long process in helping the patient realizes that she needs escalation of therapy to avoid complications from her Crohn's disease. She seems to be at the point where she will except and actually wishes to initiate biologic therapy. She is interested in Humira. She is ultimately interested in ostomy reversal. She understands that her disease needs to be in remission before this would even be entertained. --I will have her continue prednisone 10 mg daily for the next 2 months we will hopefully be able to eventually wean this off --Humira initiation with induction after checking quantiferon gold and hepatitis serologies (We also discussed the risks in great detail including the risk of infection (including reactivation of latent TB and underlying viral hepatitis), hepatotoxicity, leukopenia, pancreatitis, nausea, malignancy (specifically lymphoma), demyelinating disease, and even heart failure.) --Ostomy care as she is doing  2. Depression -- an ongoing issue for her. She is alternating her doses of Prozac. I strongly encouraged her to allow me to refer her to behavioral health for behavioral health management. She stated she will think about this and let me know. Her sister, Vaughan Basta, will help encourage her on this regard. She will continue Prozac I recommended 40 mg daily (this was her prior dose)  3. Insomnia -- can continue Ambien 10 mg daily at bedtime when necessary  4. GERD/indigestion -- intermittent. Continue famotidine 20 mg twice a day when necessary  10 week follow-up, sooner if necessary 45 minutes spent with the patient today. Greater than 50% was spent in counseling and coordination of care with the patient

## 2016-09-04 LAB — HEPATITIS B CORE ANTIBODY, TOTAL: HEP B C TOTAL AB: NONREACTIVE

## 2016-09-04 LAB — HEPATITIS B SURFACE ANTIGEN: Hepatitis B Surface Ag: NEGATIVE

## 2016-09-04 LAB — HEPATITIS C ANTIBODY: HCV AB: NEGATIVE

## 2016-09-04 NOTE — Progress Notes (Signed)
Humira information faxed to [facility]. Nurse ambassador form faxed. Nurse ambassador will do injection training.

## 2016-09-05 LAB — QUANTIFERON TB GOLD ASSAY (BLOOD)
Interferon Gamma Release Assay: NEGATIVE
MITOGEN-NIL SO: 9.39 [IU]/mL
Quantiferon Nil Value: 0.03 IU/mL
Quantiferon Tb Ag Minus Nil Value: <0 IU/mL

## 2016-09-08 ENCOUNTER — Telehealth: Payer: Self-pay | Admitting: Internal Medicine

## 2016-09-09 NOTE — Telephone Encounter (Signed)
Patient's sister Vaughan Basta states she is returning phone call to [facility] best call back # [phone].

## 2016-09-09 NOTE — Telephone Encounter (Signed)
Left message to call back.  Pt was approved for Humira but will have a very high copay of 3,000.00. [facility] is starting a pt assistance application for pt. Pt sister mentioned that Dr. Hilarie Fredrickson thought she might qualify for a $5 copay. Pt has medicare so she would not qualify for the 5.00 copay. Dr. Hilarie Fredrickson notified that currently working on pt assistance for her.

## 2016-09-10 NOTE — Telephone Encounter (Signed)
Noted, please notify Lindie Spruce (our Humira rep) regarding this patient who may need assistance

## 2016-09-11 ENCOUNTER — Other Ambulatory Visit: Payer: Self-pay | Admitting: Internal Medicine

## 2016-09-11 DIAGNOSIS — K51519 Left sided colitis with unspecified complications: Secondary | ICD-10-CM

## 2016-09-12 DIAGNOSIS — K5090 Crohn's disease, unspecified, without complications: Secondary | ICD-10-CM | POA: Diagnosis not present

## 2016-09-12 DIAGNOSIS — Z932 Ileostomy status: Secondary | ICD-10-CM | POA: Diagnosis not present

## 2016-09-15 DIAGNOSIS — K50113 Crohn's disease of large intestine with fistula: Secondary | ICD-10-CM | POA: Diagnosis not present

## 2016-09-18 NOTE — Telephone Encounter (Signed)
Called Humira pt assistance and they have received all of the information and forms, waiting on review.

## 2016-09-23 NOTE — Telephone Encounter (Signed)
Pt approved for Humira drug assistance.

## 2016-10-02 ENCOUNTER — Ambulatory Visit (INDEPENDENT_AMBULATORY_CARE_PROVIDER_SITE_OTHER): Payer: Medicare HMO | Admitting: Family Medicine

## 2016-10-02 ENCOUNTER — Encounter: Payer: Self-pay | Admitting: Family Medicine

## 2016-10-02 VITALS — BP 136/86 | HR 82 | Temp 97.9°F | Resp 16 | Ht 66.0 in | Wt 147.4 lb

## 2016-10-02 DIAGNOSIS — Z0000 Encounter for general adult medical examination without abnormal findings: Secondary | ICD-10-CM

## 2016-10-02 DIAGNOSIS — Z136 Encounter for screening for cardiovascular disorders: Secondary | ICD-10-CM

## 2016-10-02 LAB — COMPREHENSIVE METABOLIC PANEL
ALT: 15 U/L (ref 0–35)
AST: 21 U/L (ref 0–37)
Albumin: 4 g/dL (ref 3.5–5.2)
Alkaline Phosphatase: 83 U/L (ref 39–117)
BILIRUBIN TOTAL: 0.6 mg/dL (ref 0.2–1.2)
BUN: 14 mg/dL (ref 6–23)
CALCIUM: 9.2 mg/dL (ref 8.4–10.5)
CO2: 23 meq/L (ref 19–32)
Chloride: 104 mEq/L (ref 96–112)
Creatinine, Ser: 0.96 mg/dL (ref 0.40–1.20)
GFR: 60.23 mL/min (ref >60.00)
GLUCOSE: 79 mg/dL (ref 70–99)
POTASSIUM: 3.4 meq/L — AB (ref 3.5–5.1)
Sodium: 137 mEq/L (ref 135–145)
Total Protein: 7 g/dL (ref 6.0–8.3)

## 2016-10-02 LAB — LIPID PANEL
CHOL/HDL RATIO: 2
Cholesterol: 186 mg/dL (ref 0–200)
HDL: 80.1 mg/dL (ref >39.00)
LDL Cholesterol: 70 mg/dL (ref 0–99)
NONHDL: 106.1
Triglycerides: 181 mg/dL — ABNORMAL HIGH (ref 0.0–149.0)
VLDL: 36.2 mg/dL (ref 0.0–40.0)

## 2016-10-02 NOTE — Patient Instructions (Signed)
Preventive Care 65 Years and Older, Female Preventive care refers to lifestyle choices and visits with your health care provider that can promote health and wellness. What does preventive care include?  A yearly physical exam. This is also called an annual well check.  Dental exams once or twice a year.  Routine eye exams. Ask your health care provider how often you should have your eyes checked.  Personal lifestyle choices, including: ? Daily care of your teeth and gums. ? Regular physical activity. ? Eating a healthy diet. ? Avoiding tobacco and drug use. ? Limiting alcohol use. ? Practicing safe sex. ? Taking low-dose aspirin every day. ? Taking vitamin and mineral supplements as recommended by your health care provider. What happens during an annual well check? The services and screenings done by your health care provider during your annual well check will depend on your age, overall health, lifestyle risk factors, and family history of disease. Counseling Your health care provider may ask you questions about your:  Alcohol use.  Tobacco use.  Drug use.  Emotional well-being.  Home and relationship well-being.  Sexual activity.  Eating habits.  History of falls.  Memory and ability to understand (cognition).  Work and work environment.  Reproductive health.  Screening You may have the following tests or measurements:  Height, weight, and BMI.  Blood pressure.  Lipid and cholesterol levels. These may be checked every 5 years, or more frequently if you are over 50 years old.  Skin check.  Lung cancer screening. You may have this screening every year starting at age 55 if you have a 30-pack-year history of smoking and currently smoke or have quit within the past 15 years.  Fecal occult blood test (FOBT) of the stool. You may have this test every year starting at age 50.  Flexible sigmoidoscopy or colonoscopy. You may have a sigmoidoscopy every 5 years or  a colonoscopy every 10 years starting at age 50.  Hepatitis C blood test.  Hepatitis B blood test.  Sexually transmitted disease (STD) testing.  Diabetes screening. This is done by checking your blood sugar (glucose) after you have not eaten for a while (fasting). You may have this done every 1-3 years.  Bone density scan. This is done to screen for osteoporosis. You may have this done starting at age 65.  Mammogram. This may be done every 1-2 years. Talk to your health care provider about how often you should have regular mammograms.  Talk with your health care provider about your test results, treatment options, and if necessary, the need for more tests. Vaccines Your health care provider may recommend certain vaccines, such as:  Influenza vaccine. This is recommended every year.  Tetanus, diphtheria, and acellular pertussis (Tdap, Td) vaccine. You may need a Td booster every 10 years.  Varicella vaccine. You may need this if you have not been vaccinated.  Zoster vaccine. You may need this after age 60.  Measles, mumps, and rubella (MMR) vaccine. You may need at least one dose of MMR if you were born in 1957 or later. You may also need a second dose.  Pneumococcal 13-valent conjugate (PCV13) vaccine. One dose is recommended after age 65.  Pneumococcal polysaccharide (PPSV23) vaccine. One dose is recommended after age 65.  Meningococcal vaccine. You may need this if you have certain conditions.  Hepatitis A vaccine. You may need this if you have certain conditions or if you travel or work in places where you may be exposed to hepatitis   A.  Hepatitis B vaccine. You may need this if you have certain conditions or if you travel or work in places where you may be exposed to hepatitis B.  Haemophilus influenzae type b (Hib) vaccine. You may need this if you have certain conditions.  Talk to your health care provider about which screenings and vaccines you need and how often you  need them. This information is not intended to replace advice given to you by your health care provider. Make sure you discuss any questions you have with your health care provider. Document Released: 05/11/2015 Document Revised: 01/02/2016 Document Reviewed: 02/13/2015 Elsevier Interactive Patient Education  2017 [employer].

## 2016-10-02 NOTE — Progress Notes (Signed)
Subjective:     Sara Barnes is a 76 y.o. female and is here for a comprehensive physical exam. The patient reports no problems.--pt had iliostomy (crohns) about Nov -- doing well since surgery---  Should have it reversed in 6 months -- she is on humira -- goes to [place] History   Social History  . Marital status: Widowed    Spouse name: N/A  . Number of children: 1  . Years of education: N/A   Occupational History  . 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 Smoking status: Never Smoker  . Smokeless tobacco: Never Used  . Alcohol use 0.0 oz/week     Comment: daily wine with dinner  . Drug use: No  . Sexual activity: Not Currently    Partners: Male   Other Topics Concern  . Not on file   Social History Narrative   No exercise due to health   Health Maintenance  Topic Date Due  . Samul Dada  08/11/2016  . COLONOSCOPY  09/24/2016  . INFLUENZA VACCINE  11/26/2016  . MAMMOGRAM  04/25/2017  . DEXA SCAN  Completed  . PNA vac Low Risk Adult  Completed    The following portions of the patient's history were reviewed and updated as appropriate:  She  has a past medical history of Allergy; Anemia; Basal cell carcinoma; Blood transfusion without reported diagnosis; Cancer of lower-inner quadrant of female breast ([place]) (11/30/2014); Cancer of lower-inner quadrant of left female breast ([place]) (11/30/2014); Complication of anesthesia; Crohn's disease of large intestine with abscess ([place]); Dental crowns present; Depression; Family history of adverse reaction to anesthesia; Indigestion; PONV (postoperative nausea and vomiting); Runny nose (12/07/2014); Seasonal allergies; Ulcerative colitis; and Vitamin B12 deficiency. She  does not have any pertinent problems on file. She  has a past surgical history that includes Abdominal hysterectomy; Appendectomy; Breast enhancement surgery; Incision and drainage perirectal abscess (N/A, 09/28/2013); Breast lumpectomy  (Left); Inner ear surgery (Bilateral); Colonoscopy with propofol (05/28/2011; 06/28/2014); Breast lumpectomy with radioactive seed and sentinel lymph node biopsy (Left, 12/13/2014); Excision basal cell carcinoma (Left); Flexible sigmoidoscopy (N/A, 01/08/2016); Incision and drainage perirectal abscess (N/A, 01/22/2016); Laparoscopic diverted colostomy (N/A, 02/11/2016); and Incision and drainage abscess (2018). Her family history includes Anesthesia problems in her sister; Asthma in her sister; Brain cancer in her father; Diabetes in her brother; Emphysema in her maternal grandfather; Kidney disease in her brother; Prostate cancer (age of onset: 94) in her brother; Rheum arthritis in her brother and mother. She  reports that she has never smoked. She has never used smokeless tobacco. She reports that she drinks alcohol. She reports that she does not use drugs. She has a current medication list which includes the following prescription(s): adalimumab, cyanocobalamin, famotidine, fluoxetine, prednisone, and zolpidem. Current Outpatient Prescriptions on File Prior to Visit  Medication Sig Dispense Refill  . Cyanocobalamin (VITAMIN B-12 IJ) Inject 1 mL as directed every 30 (thirty) days.    . famotidine (PEPCID) 40 MG tablet Take 1 tablet (40 mg total) by mouth 2 (two) times daily. (Patient taking differently: Take 40 mg by mouth daily as needed for heartburn or indigestion. )    . FLUoxetine (PROZAC) 20 MG capsule Take 1 capsule (20 mg total) by mouth 2 (two) times daily. 180 capsule 3  . predniSONE (DELTASONE) 20 MG tablet Take 1 tablet (20 mg total) by mouth daily with breakfast. (Patient taking differently: Take 10 mg by mouth  daily with breakfast. ) 30 tablet 2  . zolpidem (AMBIEN) 10 MG tablet TAKE ONE TABLET BY MOUTH AT BEDTIME AS NEEDED FOR SLEEP 30 tablet 2   No current facility-administered medications on file prior to visit.    She is allergic to codeine; mesalamine; other; oxycodone-acetaminophen;  and sulfa antibiotics..  Review of Systems Review of Systems  Constitutional: Negative for activity change, appetite change and fatigue.  HENT: Negative for , congestion, tinnitus and ear discharge.  dentist q16m +HOH-- no change Eyes: Negative for visual disturbance (see optho q1y -- vision corrected to 20/20 with glasses).  Respiratory: Negative for cough, chest tightness and shortness of breath.   Cardiovascular: Negative for chest pain, palpitations and leg swelling.  Gastrointestinal: Negative for abdominal pain, diarrhea, constipation and abdominal distention.  Genitourinary: Negative for urgency, frequency, decreased urine volume and difficulty urinating.  Musculoskeletal: Negative for back pain, arthralgias and gait problem.  Skin: Negative for color change, pallor and rash.  Neurological: Negative for dizziness, light-headedness, numbness and headaches.  Hematological: Negative for adenopathy. Does not bruise/bleed easily.  Psychiatric/Behavioral: Negative for suicidal ideas, confusion, sleep disturbance, self-injury, dysphoric mood, decreased concentration and agitation.       Objective:    BP 136/86 (BP Location: Left Arm, Cuff Size: Normal)   Pulse 82   Temp 97.9 F (36.6 C) (Oral)   Resp 16   Ht 5' 6"  (1.676 m)   Wt 147 lb 6.4 oz (66.9 kg)   SpO2 98%   BMI 23.79 kg/m  General appearance: alert, cooperative, appears stated age and no distress Head: Normocephalic, without obvious abnormality, atraumatic Eyes: conjunctivae/corneas clear. PERRL, EOM's intact. Fundi benign. Ears: normal TM's and external ear canals both ears and HOH b/l Nose: Nares normal. Septum midline. Mucosa normal. No drainage or sinus tenderness. Throat: lips, mucosa, and tongue normal; teeth and gums normal Neck: no adenopathy, no carotid bruit, no JVD, supple, symmetrical, trachea midline and thyroid not enlarged, symmetric, no tenderness/mass/nodules Back: symmetric, no curvature. ROM normal.  No CVA tenderness. Lungs: clear to auscultation bilaterally Breasts: normal appearance, no masses or tenderness Heart: regular rate and rhythm, S1, S2 normal, no murmur, click, rub or gallop Abdomen: soft, non-tender; bowel sounds normal; no masses,  no organomegaly and iliostomy  Pelvic: not indicated; status post hysterectomy, negative ROS Extremities: extremities normal, atraumatic, no cyanosis or edema Pulses: 2+ and symmetric Skin: Skin color, texture, turgor normal. No rashes or lesions Lymph nodes: Cervical, supraclavicular, and axillary nodes normal. Neurologic: Alert and oriented X 3, normal strength and tone. Normal symmetric reflexes. Normal coordination and gait    Assessment:    Healthy female exam.      Plan:     See After Visit Summary for Counseling Recommendations   1. Preventative health care See above Will get shingrix at pharmacy  2. Ischemic heart disease screen   - Comprehensive metabolic panel - Lipid panel

## 2016-10-06 ENCOUNTER — Other Ambulatory Visit: Payer: Self-pay | Admitting: Family Medicine

## 2016-10-06 DIAGNOSIS — E785 Hyperlipidemia, unspecified: Secondary | ICD-10-CM

## 2016-10-10 DIAGNOSIS — Z932 Ileostomy status: Secondary | ICD-10-CM | POA: Diagnosis not present

## 2016-10-10 DIAGNOSIS — K5090 Crohn's disease, unspecified, without complications: Secondary | ICD-10-CM | POA: Diagnosis not present

## 2016-10-14 ENCOUNTER — Telehealth: Payer: Self-pay | Admitting: Internal Medicine

## 2016-10-14 NOTE — Telephone Encounter (Signed)
Spoke with pt and she is aware.

## 2016-10-14 NOTE — Telephone Encounter (Signed)
Pt states she is currently taking prednisone 39m daily. She wants to know if she needs to taper down and if so need instruction. Also states [facility] will be faxing over a form to get her script sent there, this will be a little cheaper for her. She is getting Humira through the pt assistance program.   Please advise regarding prednisone.

## 2016-10-14 NOTE — Telephone Encounter (Signed)
Would recommend we continue prednisone at 10 mg for the first 8 weeks of Humira therapy We will then wean very slowly depending on her symptoms

## 2016-10-15 ENCOUNTER — Other Ambulatory Visit: Payer: Self-pay | Admitting: *Deleted

## 2016-10-15 MED ORDER — FLUOXETINE HCL 20 MG PO CAPS: 20.0000 mg | ORAL_CAPSULE | Freq: Two times a day (BID) | ORAL | 0 refills | Status: DC

## 2016-10-15 MED ORDER — PREDNISONE 10 MG PO TABS: 10.0000 mg | ORAL_TABLET | Freq: Every day | ORAL | 0 refills | Status: DC

## 2016-10-15 NOTE — Telephone Encounter (Signed)
Received a request from [facility] mail in pharmacy for patient to get 90 day supply of Fluoxetine 20 mg capsules and prednisone. Dr Hilarie Fredrickson has okayed 90 day supply of prednisone 10 mg capsules since he has decreased patient's dosage to 10 mg for the short term while she is getting started on Humira. Rx sent to [facility]

## 2016-10-22 DIAGNOSIS — Z932 Ileostomy status: Secondary | ICD-10-CM | POA: Diagnosis not present

## 2016-10-30 ENCOUNTER — Other Ambulatory Visit (INDEPENDENT_AMBULATORY_CARE_PROVIDER_SITE_OTHER): Payer: Medicare HMO

## 2016-10-30 ENCOUNTER — Ambulatory Visit (INDEPENDENT_AMBULATORY_CARE_PROVIDER_SITE_OTHER)
Admission: RE | Admit: 2016-10-30 | Discharge: 2016-10-30 | Disposition: A | Discharge status: Home / Self Care | Payer: Medicare HMO | Source: Ambulatory Visit | Attending: Internal Medicine | Admitting: Internal Medicine

## 2016-10-30 ENCOUNTER — Ambulatory Visit (INDEPENDENT_AMBULATORY_CARE_PROVIDER_SITE_OTHER): Payer: Medicare HMO | Admitting: Internal Medicine

## 2016-10-30 ENCOUNTER — Encounter: Payer: Self-pay | Admitting: Internal Medicine

## 2016-10-30 VITALS — BP 128/70 | HR 84 | Ht 65.0 in | Wt 152.5 lb

## 2016-10-30 DIAGNOSIS — Z79899 Other long term (current) drug therapy: Secondary | ICD-10-CM

## 2016-10-30 DIAGNOSIS — K50919 Crohn's disease, unspecified, with unspecified complications: Secondary | ICD-10-CM

## 2016-10-30 DIAGNOSIS — E538 Deficiency of other specified B group vitamins: Secondary | ICD-10-CM | POA: Diagnosis not present

## 2016-10-30 DIAGNOSIS — G4701 Insomnia due to medical condition: Secondary | ICD-10-CM | POA: Diagnosis not present

## 2016-10-30 DIAGNOSIS — R0602 Shortness of breath: Secondary | ICD-10-CM

## 2016-10-30 DIAGNOSIS — K50113 Crohn's disease of large intestine with fistula: Secondary | ICD-10-CM

## 2016-10-30 DIAGNOSIS — Z939 Artificial opening status, unspecified: Secondary | ICD-10-CM

## 2016-10-30 DIAGNOSIS — R140 Abdominal distension (gaseous): Secondary | ICD-10-CM | POA: Diagnosis not present

## 2016-10-30 LAB — COMPREHENSIVE METABOLIC PANEL
ALBUMIN: 4 g/dL (ref 3.5–5.2)
ALT: 13 U/L (ref 0–35)
AST: 17 U/L (ref 0–37)
Alkaline Phosphatase: 76 U/L (ref 39–117)
BUN: 21 mg/dL (ref 6–23)
CALCIUM: 9 mg/dL (ref 8.4–10.5)
CHLORIDE: 102 meq/L (ref 96–112)
CO2: 21 mEq/L (ref 19–32)
CREATININE: 0.9 mg/dL (ref 0.40–1.20)
GFR: 64.87 mL/min (ref >60.00)
Glucose, Bld: 112 mg/dL — ABNORMAL HIGH (ref 70–99)
POTASSIUM: 3.8 meq/L (ref 3.5–5.1)
Sodium: 133 mEq/L — ABNORMAL LOW (ref 135–145)
Total Bilirubin: 0.6 mg/dL (ref 0.2–1.2)
Total Protein: 7 g/dL (ref 6.0–8.3)

## 2016-10-30 LAB — CBC WITH DIFFERENTIAL/PLATELET
BASOS PCT: 0.7 % (ref 0.0–3.0)
Basophils Absolute: 0.1 10*3/uL (ref 0.0–0.1)
EOS PCT: 0.2 % (ref 0.0–5.0)
Eosinophils Absolute: 0 10*3/uL (ref 0.0–0.7)
HCT: 42.2 % (ref 36.0–46.0)
Hemoglobin: 14.3 g/dL (ref 12.0–15.0)
LYMPHS ABS: 1.7 10*3/uL (ref 0.7–4.0)
Lymphocytes Relative: 15.4 % (ref 12.0–46.0)
MCHC: 33.8 g/dL (ref 30.0–36.0)
MCV: 96.5 fl (ref 78.0–100.0)
MONOS PCT: 4.8 % (ref 3.0–12.0)
Monocytes Absolute: 0.5 10*3/uL (ref 0.1–1.0)
NEUTROS PCT: 78.9 % — AB (ref 43.0–77.0)
Neutro Abs: 8.9 10*3/uL — ABNORMAL HIGH (ref 1.4–7.7)
Platelets: 224 10*3/uL (ref 150.0–400.0)
RBC: 4.38 Mil/uL (ref 3.87–5.11)
RDW: 12.4 % (ref 11.5–15.5)
WBC: 11.3 10*3/uL — ABNORMAL HIGH (ref 4.0–10.5)

## 2016-10-30 LAB — VITAMIN B12: Vitamin B-12: 555 pg/mL (ref 211–911)

## 2016-10-30 LAB — HIGH SENSITIVITY CRP: CRP, High Sensitivity: 1.94 mg/L (ref 0.000–5.000)

## 2016-10-30 NOTE — Progress Notes (Signed)
Subjective:    Patient ID: Sara Barnes, female    DOB: [birth year], 76 y.o.   MRN: [id number]  HPI Jennfer Gassen is a 76 year old female with history of Crohn's colitis with perianal fistulous disease, end ileostomy who is here for follow-up. She is here with her sister.  She began Humira on 09/27/2016 and completed induction with the 80 mg dose on 10/11/2016. She took her 40 mg last dose on 10/25/2016. She is tolerating the injection well but notes that it stings significantly when being administered.   She reports she feels like this is helping in that she has noticed dramatically less perianal drainage and has had no further bleeding per rectum. She will occasionally passed mucus-like material from her rectum on an infrequent basis. Her ostomy is functioning well. She's noticed some upper abdominal fullness and bloating. This is associated with belching and discomfort but not pain. She also has noticed increased gas in her ostomy bag. Ostomy output/effleunt has been normal for her and nonbloody and non-melenic. She has been using Pepcid 40 mg 1-2 times daily slightly more frequently with her recent upper abdominal fullness. She has gained weight and has had improving appetite. She denies dysphagia and odynophagia.  She does report some dyspnea on exertion particularly if she is active over several hours. She has been working in her garden and notices shortness of breath if she overexerts herself or gets hot. She denies chest pain. She denies orthopnea and PND. She denies lower extremity swelling.  No fevers or chills.  She has continued B12 injection. She is on prednisone at 10 mg daily and wishes to wean this. She is also using Ambien at bedtime as needed for sleep.  She is enrolled with the [employer] program which she has found helpful.   Review of Systems As per HPI, otherwise negative  Current Medications, Allergies, Past Medical History, Past Surgical History, Family History  and Social History were reviewed in [employer] record.     Objective:   Physical Exam BP 128/70 (BP Location: Left Arm, Patient Position: Sitting, Cuff Size: Normal)   Pulse 84   Ht 5' 5"  (1.651 m) Comment: height measured without shoes  Wt 152 lb 8 oz (69.2 kg)   BMI 25.38 kg/m  Constitutional: Well-developed and well-nourished. No distress. HEENT: Normocephalic and atraumatic. Oropharynx is clear and moist. Conjunctivae are normal.  No scleral icterus. Neck: Neck supple. Trachea midline. Cardiovascular: Normal rate, regular rhythm and intact distal pulses. No M/R/G Pulmonary/chest: Effort normal and breath sounds normal. No wheezing, rales or rhonchi. Abdominal: Soft, nontender, nondistended. Bowel sounds active throughout. Ostomy intact and normal-appearing in the right mid/lower quadrant Extremities: no clubbing, cyanosis, or edema Neurological: Alert and oriented to person place and time. Skin: Skin is warm and dry. Psychiatric: Normal mood and affect. Behavior is normal.     Assessment & Plan:  76 year old female with history of Crohn's colitis with perianal fistulous disease, end ileostomy who is here for follow-up.  1. 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 She has had less perianal drainage and no perianal pain likely as a result of anti-TNF therapy. She is extremely instituted in having her ileostomy reversed and would prefer this done by the end of the year if possible. I have told her that I doubt that this will be possible given her extensive disease previously. We will/would need to document improvement in her  fistulae and also in her colitis prior to reversal. I also would like her to follow-up, likely after her next visit with me, with Dr. Sheryn Bison regarding the setons that she currently has in place. These may be able to be removed once she is  well established on anti-TNF therapy. --Continue Humira 40 mg every 14 days (we discussed the new Humira formulation coming out in the next several months. Citrate is being removed and hopefully this medication will stinging/burning less with injection) --Reduce prednisone to 5 mg daily --Check CBC, CMP, CRP and B12 today --Follow-up in 3 months  2. Abdominal bloating with epigastric discomfort -- I have asked that she schedule her Pepcid 40 mg twice daily rather than use this as needed. No evidence of obstruction or partial obstructive symptoms. She is at risk for gastritis with chronic prednisone. I asked her to notify me if this symptom fails to improve  3. B12 deficiency -- check B12 today, continue B12 once monthly for now  4. Shortness of breath -- I expect this is related to deconditioning and the fact that she is now able to tolerate more activity given her overall improvement in her Crohn's disease. Lungs are clear to auscultation today but I will check PA and lateral chest x-ray for completeness. No evidence of edema or CHF by exam.  5. Insomnia -- continue Ambien 10 mg reduced to 5 mg daily at bedtime if able. I expect her insomnia to improve with reducing prednisone doses  Follow-up in 3 months, sooner if needed

## 2016-10-30 NOTE — Patient Instructions (Signed)
If you are age 75 or older, your body mass index should be between 23-30. Your Body mass index is 25.38 kg/m. If this is out of the aforementioned range listed, please consider follow up with your Primary Care Provider.  If you are age 74 or younger, your body mass index should be between 19-25. Your Body mass index is 25.38 kg/m. If this is out of the aformentioned range listed, please consider follow up with your Primary Care Provider.   Decrease your Prednisone to 5 mg every day.  Continue Humira 35m every 14 days  Take Pepcid twice a day, on a scheduled basis.  Your physician has requested that you go to the basement for lab work before leaving today.  Your provider has requested that you have a chest x ray before leaving today. Please go to the basement floor to our Radiology department for the test.

## 2016-11-06 ENCOUNTER — Telehealth: Payer: Self-pay | Admitting: Internal Medicine

## 2016-11-06 NOTE — Telephone Encounter (Signed)
Reviewed results letter with pt over the phone. Pt states she did not receive the letters.

## 2016-11-12 DIAGNOSIS — Z932 Ileostomy status: Secondary | ICD-10-CM | POA: Diagnosis not present

## 2016-11-12 DIAGNOSIS — K5090 Crohn's disease, unspecified, without complications: Secondary | ICD-10-CM | POA: Diagnosis not present

## 2016-11-20 ENCOUNTER — Other Ambulatory Visit: Payer: Self-pay | Admitting: Family Medicine

## 2016-11-20 ENCOUNTER — Telehealth: Payer: Self-pay | Admitting: Family Medicine

## 2016-11-20 DIAGNOSIS — L918 Other hypertrophic disorders of the skin: Secondary | ICD-10-CM

## 2016-11-20 NOTE — Telephone Encounter (Signed)
°  Relation to RR:[facility] Call back number:[phone] Pharmacy:  Reason for call:  Patient requesting a referral to Dr. Allyson Sabal dermatologist skin tag removal, please advise

## 2016-11-20 NOTE — Telephone Encounter (Signed)
Called the patient and did inform her that Dr. Etter Sjogren did approve a referral for her. Put in referral for her to Dr. Allyson Sabal for skin tag removal as PCP instructed to be done. Patient has been advise requested referral done/and she will be notified when appointment is scheduled

## 2016-11-20 NOTE — Telephone Encounter (Signed)
Ok to refer.

## 2016-12-02 DIAGNOSIS — L570 Actinic keratosis: Secondary | ICD-10-CM | POA: Diagnosis not present

## 2016-12-02 DIAGNOSIS — Z85828 Personal history of other malignant neoplasm of skin: Secondary | ICD-10-CM | POA: Diagnosis not present

## 2016-12-11 ENCOUNTER — Other Ambulatory Visit: Payer: Self-pay | Admitting: Internal Medicine

## 2016-12-11 DIAGNOSIS — Z932 Ileostomy status: Secondary | ICD-10-CM | POA: Diagnosis not present

## 2016-12-11 DIAGNOSIS — K5090 Crohn's disease, unspecified, without complications: Secondary | ICD-10-CM | POA: Diagnosis not present

## 2016-12-11 DIAGNOSIS — K51519 Left sided colitis with unspecified complications: Secondary | ICD-10-CM

## 2016-12-18 ENCOUNTER — Other Ambulatory Visit: Payer: Self-pay | Admitting: Internal Medicine

## 2016-12-18 DIAGNOSIS — K51911 Ulcerative colitis, unspecified with rectal bleeding: Secondary | ICD-10-CM

## 2016-12-18 DIAGNOSIS — K6289 Other specified diseases of anus and rectum: Secondary | ICD-10-CM

## 2016-12-18 DIAGNOSIS — E538 Deficiency of other specified B group vitamins: Secondary | ICD-10-CM

## 2016-12-22 ENCOUNTER — Telehealth: Payer: Self-pay | Admitting: Internal Medicine

## 2016-12-22 NOTE — Telephone Encounter (Signed)
Pt had some fine drains placed in the rectal area in March and per pt they are supposed to come out in September. Pt wanted Dr. Hassell Done with [facility] to remove the drains. Discussed with pt that she should contact Dr. Earlie Server office to see if they will remove the drains since they did not place them. Pt understands and will contact [facility].

## 2016-12-30 ENCOUNTER — Telehealth: Payer: Self-pay | Admitting: Internal Medicine

## 2016-12-30 NOTE — Telephone Encounter (Signed)
Pt states Dr. Hilarie Fredrickson wanted her to have there rectal drain tubes removed prior to considering reversal surgery. Pt wanted to know if Dr. Hassell Done with [facility] could remove them, they were placed at [facility]. Pt called [facility] last week and never heard back from them. Pt wants to know if Dr. Hilarie Fredrickson could arrange this with Dr. Hassell Done. She states if she has to go back to [facility] to have them removed she will but would prefer to have them removed locally. Please advise.

## 2016-12-31 NOTE — Telephone Encounter (Signed)
She should follow-up with Dr. Sheryn Bison given that Dr. Sheryn Bison most recently examined her anatomically and also is the surgeon who placed the setons Removal of setons would be per Dr. Sheryn Bison Please asked that she see Dr. Sheryn Bison next available appointment and then follow-up with me

## 2016-12-31 NOTE — Telephone Encounter (Signed)
Spoke with pt and she is aware.

## 2017-01-07 ENCOUNTER — Other Ambulatory Visit (INDEPENDENT_AMBULATORY_CARE_PROVIDER_SITE_OTHER): Payer: Medicare HMO

## 2017-01-07 DIAGNOSIS — E785 Hyperlipidemia, unspecified: Secondary | ICD-10-CM | POA: Diagnosis not present

## 2017-01-07 LAB — LIPID PANEL
CHOLESTEROL: 224 mg/dL — AB (ref 0–200)
HDL: 94.9 mg/dL (ref >39.00)
NONHDL: 129.08
TRIGLYCERIDES: 245 mg/dL — AB (ref 0.0–149.0)
Total CHOL/HDL Ratio: 2
VLDL: 49 mg/dL — ABNORMAL HIGH (ref 0.0–40.0)

## 2017-01-07 LAB — COMPREHENSIVE METABOLIC PANEL
ALT: 13 U/L (ref 0–35)
AST: 21 U/L (ref 0–37)
Albumin: 3.7 g/dL (ref 3.5–5.2)
Alkaline Phosphatase: 75 U/L (ref 39–117)
BUN: 11 mg/dL (ref 6–23)
CALCIUM: 9.3 mg/dL (ref 8.4–10.5)
CHLORIDE: 106 meq/L (ref 96–112)
CO2: 22 mEq/L (ref 19–32)
CREATININE: 0.8 mg/dL (ref 0.40–1.20)
GFR: 74.28 mL/min (ref >60.00)
Glucose, Bld: 98 mg/dL (ref 70–99)
POTASSIUM: 4.1 meq/L (ref 3.5–5.1)
Sodium: 137 mEq/L (ref 135–145)
TOTAL PROTEIN: 6.5 g/dL (ref 6.0–8.3)
Total Bilirubin: 0.7 mg/dL (ref 0.2–1.2)

## 2017-01-07 LAB — LDL CHOLESTEROL, DIRECT: Direct LDL: 110 mg/dL

## 2017-01-08 ENCOUNTER — Other Ambulatory Visit: Payer: Self-pay | Admitting: Internal Medicine

## 2017-01-08 DIAGNOSIS — K51519 Left sided colitis with unspecified complications: Secondary | ICD-10-CM

## 2017-01-12 ENCOUNTER — Other Ambulatory Visit: Payer: Self-pay | Admitting: Family Medicine

## 2017-01-12 DIAGNOSIS — E785 Hyperlipidemia, unspecified: Secondary | ICD-10-CM

## 2017-01-15 ENCOUNTER — Other Ambulatory Visit: Payer: Self-pay | Admitting: Family Medicine

## 2017-01-15 MED ORDER — SIMVASTATIN 20 MG PO TABS: 20.0000 mg | ORAL_TABLET | Freq: Every day | ORAL | 3 refills | Status: DC

## 2017-01-21 DIAGNOSIS — K5090 Crohn's disease, unspecified, without complications: Secondary | ICD-10-CM | POA: Diagnosis not present

## 2017-01-21 DIAGNOSIS — Z932 Ileostomy status: Secondary | ICD-10-CM | POA: Diagnosis not present

## 2017-02-04 ENCOUNTER — Encounter: Payer: Self-pay | Admitting: Internal Medicine

## 2017-02-04 ENCOUNTER — Ambulatory Visit: Payer: Medicare HMO | Admitting: Internal Medicine

## 2017-02-04 ENCOUNTER — Ambulatory Visit (INDEPENDENT_AMBULATORY_CARE_PROVIDER_SITE_OTHER): Payer: Medicare HMO | Admitting: Internal Medicine

## 2017-02-04 VITALS — BP 122/76 | HR 72 | Ht 66.0 in | Wt 155.0 lb

## 2017-02-04 DIAGNOSIS — G4700 Insomnia, unspecified: Secondary | ICD-10-CM | POA: Diagnosis not present

## 2017-02-04 DIAGNOSIS — E538 Deficiency of other specified B group vitamins: Secondary | ICD-10-CM | POA: Diagnosis not present

## 2017-02-04 DIAGNOSIS — R1013 Epigastric pain: Secondary | ICD-10-CM | POA: Diagnosis not present

## 2017-02-04 DIAGNOSIS — Z23 Encounter for immunization: Secondary | ICD-10-CM

## 2017-02-04 DIAGNOSIS — K50113 Crohn's disease of large intestine with fistula: Secondary | ICD-10-CM

## 2017-02-04 MED ORDER — ZOLPIDEM TARTRATE 10 MG PO TABS: ORAL_TABLET | ORAL | 1 refills | Status: DC

## 2017-02-04 NOTE — Patient Instructions (Addendum)
Continue prednisone 5 mg.  Continue Humira.   Continue B12 injections.  Continue Famotidine.  We have given you a flu shot today.  We have sent the following medications to your pharmacy for you to pick up at your convenience: Ambien  If you are age 75 or older, your body mass index should be between 23-30. Your Body mass index is 25.02 kg/m. If this is out of the aforementioned range listed, please consider follow up with your Primary Care Provider.  If you are age 20 or younger, your body mass index should be between 19-25. Your Body mass index is 25.02 kg/m. If this is out of the aformentioned range listed, please consider follow up with your Primary Care Provider.
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
## 2017-02-09 ENCOUNTER — Telehealth: Payer: Self-pay | Admitting: Internal Medicine

## 2017-02-09 NOTE — Telephone Encounter (Signed)
I will resend the email to Dr. Sheryn Bison as I have not heard back from her As of now she is plan on attending the followup appt with Dr. Sheryn Bison this week as scheduled

## 2017-02-09 NOTE — Telephone Encounter (Signed)
Pt states the setons are supposed to be removed on Thursday by Dr. Sheryn Bison. Pt states Dr. Hilarie Fredrickson was supposed to check with Dr. Sheryn Bison to see if she was still gonna have these removed or did he need to "take another look" before they were removed. She reports she is now having more bleeding, BRB in the mornings. Please advise.

## 2017-02-10 NOTE — Telephone Encounter (Signed)
Spoke with Sara Barnes and she is aware of comments below:  Pyrtle, Lajuan Lines, MD  Algernon Huxley, RN        Dr. Sheryn Bison emailed me back and would like to see the patient in her clinic before we prroceed with colonoscopy or further evaluation here.  So, please let the patient and her sister know to keep the appt with Dr. Sheryn Bison at [facility] later this week  This appt is very important for the patient.  Please copy this note to the medical record.  Thanks  JMP     Dr. 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 Sara Barnes states it is bigger than a quarter. Sara Barnes wants to know if Dr. Hilarie Fredrickson thinks she needs to continue with the Humira since this sore has appeared. Please advise.

## 2017-02-10 NOTE — Telephone Encounter (Signed)
Yes would continue for now, but she should see her dermatologist ASAP

## 2017-02-10 NOTE — Telephone Encounter (Signed)
Spoke with pt and she is aware.

## 2017-02-11 DIAGNOSIS — Z932 Ileostomy status: Secondary | ICD-10-CM | POA: Diagnosis not present

## 2017-02-11 DIAGNOSIS — K5090 Crohn's disease, unspecified, without complications: Secondary | ICD-10-CM | POA: Diagnosis not present

## 2017-02-12 DIAGNOSIS — K6289 Other specified diseases of anus and rectum: Secondary | ICD-10-CM | POA: Diagnosis not present

## 2017-02-12 DIAGNOSIS — K50113 Crohn's disease of large intestine with fistula: Secondary | ICD-10-CM | POA: Diagnosis not present

## 2017-02-16 ENCOUNTER — Telehealth: Payer: Self-pay | Admitting: Internal Medicine

## 2017-02-16 NOTE — Telephone Encounter (Signed)
She saw Dr. Sheryn Bison last week Dr. March Rummage note is not officially signed and so I have not heard from her yet; I will hear (just haven't yet) Okay to schedule colonoscopy in November; has end-ileostomy and so would not need oral prep. Would recommend 1-2 fleets enemas 2-3 hours before procedure

## 2017-02-16 NOTE — Telephone Encounter (Signed)
Setons are removed. When do you want the colonoscopy scheduled?

## 2017-02-19 NOTE — Telephone Encounter (Signed)
Patient calling back regarding this best call back # is [phone].

## 2017-02-19 NOTE — Telephone Encounter (Signed)
Pt scheduled for previsit 03/11/17@2 :30pm, colon scheduled in the [place] 03/23/17@4pm . Pt aware of appt.

## 2017-02-26 ENCOUNTER — Ambulatory Visit (INDEPENDENT_AMBULATORY_CARE_PROVIDER_SITE_OTHER): Payer: Medicare HMO | Admitting: Nurse Practitioner

## 2017-02-26 ENCOUNTER — Encounter: Payer: Self-pay | Admitting: Nurse Practitioner

## 2017-02-26 ENCOUNTER — Other Ambulatory Visit (INDEPENDENT_AMBULATORY_CARE_PROVIDER_SITE_OTHER): Payer: Medicare HMO

## 2017-02-26 ENCOUNTER — Telehealth: Payer: Self-pay | Admitting: Internal Medicine

## 2017-02-26 VITALS — BP 118/76 | HR 68 | Ht 67.0 in | Wt 154.5 lb

## 2017-02-26 DIAGNOSIS — R1031 Right lower quadrant pain: Secondary | ICD-10-CM

## 2017-02-26 DIAGNOSIS — R109 Unspecified abdominal pain: Secondary | ICD-10-CM | POA: Diagnosis not present

## 2017-02-26 DIAGNOSIS — K50919 Crohn's disease, unspecified, with unspecified complications: Secondary | ICD-10-CM | POA: Diagnosis not present

## 2017-02-26 DIAGNOSIS — R300 Dysuria: Secondary | ICD-10-CM

## 2017-02-26 DIAGNOSIS — R110 Nausea: Secondary | ICD-10-CM | POA: Diagnosis not present

## 2017-02-26 LAB — URINALYSIS, ROUTINE W REFLEX MICROSCOPIC
BILIRUBIN URINE: NEGATIVE
KETONES UR: NEGATIVE
NITRITE: NEGATIVE
PH: 5.5 (ref 5.0–8.0)
Specific Gravity, Urine: 1.025 (ref 1.000–1.030)
TOTAL PROTEIN, URINE-UPE24: NEGATIVE
URINE GLUCOSE: NEGATIVE
UROBILINOGEN UA: 0.2 (ref 0.0–1.0)

## 2017-02-26 LAB — BASIC METABOLIC PANEL
BUN: 13 mg/dL (ref 6–23)
CHLORIDE: 105 meq/L (ref 96–112)
CO2: 24 meq/L (ref 19–32)
CREATININE: 0.72 mg/dL (ref 0.40–1.20)
Calcium: 9.4 mg/dL (ref 8.4–10.5)
GFR: 83.85 mL/min (ref >60.00)
Glucose, Bld: 109 mg/dL — ABNORMAL HIGH (ref 70–99)
Potassium: 3.5 mEq/L (ref 3.5–5.1)
SODIUM: 136 meq/L (ref 135–145)

## 2017-02-26 LAB — CBC WITH DIFFERENTIAL/PLATELET
Basophils Absolute: 0.1 10*3/uL (ref 0.0–0.1)
Basophils Relative: 0.6 % (ref 0.0–3.0)
EOS PCT: 0.8 % (ref 0.0–5.0)
Eosinophils Absolute: 0.1 10*3/uL (ref 0.0–0.7)
HEMATOCRIT: 43.2 % (ref 36.0–46.0)
Hemoglobin: 14.5 g/dL (ref 12.0–15.0)
LYMPHS ABS: 1.6 10*3/uL (ref 0.7–4.0)
LYMPHS PCT: 17.8 % (ref 12.0–46.0)
MCHC: 33.4 g/dL (ref 30.0–36.0)
MCV: 99 fl (ref 78.0–100.0)
MONOS PCT: 5.6 % (ref 3.0–12.0)
Monocytes Absolute: 0.5 10*3/uL (ref 0.1–1.0)
NEUTROS ABS: 6.9 10*3/uL (ref 1.4–7.7)
NEUTROS PCT: 75.2 % (ref 43.0–77.0)
PLATELETS: 222 10*3/uL (ref 150.0–400.0)
RBC: 4.37 Mil/uL (ref 3.87–5.11)
RDW: 13.1 % (ref 11.5–15.5)
WBC: 9.1 10*3/uL (ref 4.0–10.5)

## 2017-02-26 LAB — HIGH SENSITIVITY CRP: CRP HIGH SENSITIVITY: 3.77 mg/L (ref 0.000–5.000)

## 2017-02-26 LAB — SEDIMENTATION RATE: Sed Rate: 12 mm/hr (ref 0–30)

## 2017-02-26 NOTE — Progress Notes (Addendum)
Chief Complaint:  Rectal bleeding  HPI: Patient is a 76 yo female with a hx of Crohn's colitis , perianal fistulas and ileostomy. She is followed by Dr. Hilarie Fredrickson. She started Humira in June of this year, next dose due on Saturday. . Ms Sara Barnes is here with her sister. She is passing blood through rectum. She has right lower abdominal discomfort , nausea, poor appetite and also dysuria. Transient chills sometimes at night. Ostomy output is at baseline. RLQ pain began gradually about a week ago. Pain is becoming more constant. She is also having rectal pain but that is more chronic in nature. Passing "a lot of blood"  from rectum over last two weeks. Blood is bright red. Gets a bad abdominal cramp just before having large gushes of blood. Bleeding occurs most frequently in the am. She is scheculed for colonoscopy the end of this month.    Past Medical History:  Diagnosis Date  . Allergy   . Anemia   . Basal cell carcinoma   . Blood transfusion without reported diagnosis   . 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 Cancer of lower-inner quadrant of left female breast ([place]) 11/30/2014  . Complication of anesthesia    anxiety post-op after ear surgeries  . Crohn's disease of large intestine with abscess ([place])   . Dental crowns present    recent root canal 11/2014  . Depression   . Family history of adverse reaction to anesthesia    pt's sister has hx. of post-op N/V  . Indigestion   . PONV (postoperative nausea and vomiting)   . Runny nose 12/07/2014   clear drainage, per pt.  . Seasonal allergies   . Ulcerative colitis   . Vitamin B12 deficiency     Patient's surgical history, family medical history, social history, medications and allergies were all reviewed in Epic    Physical Exam: BP 118/76   Pulse 68   Ht 5' 7"  (1.702 m)   Wt 154 lb 8 oz (70.1 kg)   BMI 24.20 kg/m   GENERAL:  Well developed white female in NAD PSYCH: :Pleasant,  cooperative, normal affect EENT:  conjunctiva pink, mucous membranes moist, neck supple without masses CARDIAC:  RRR, no murmur heard, no peripheral edema PULM: Normal respiratory effort, lungs CTA bilaterally, no wheezing ABDOMEN:  Nondistended, soft, very mild RLQ tenderness. No obvious masses, no hepatomegaly,  normal bowel sounds. Ostomy output thick, unformed, no blood SKIN:  turgor, no lesions seen Musculoskeletal:  Normal muscle tone, normal strength NEURO: Alert and oriented x 3, no focal neurologic deficits   ASSESSMENT and PLAN:  Pleasant 76 year old female with complicated Crohn's disease complicated by perianal abscess / fistulas requiring diverting loop ileostomy which she really wants reversed.  She presents with bloody discharge from rectum, RLQ discomfort, and nausea, and dysuria.  Mildly tender on exam. She is not toxic appearing. VSS.  -we discussed need for abdominal imaging. She prefers to hold off on CT scan right now. I'm going to get labs as well adalimumab levels /antibody. Obtain u/a. Will call her with results and to get a condition update. Certainly if sx worsen then we need to proceed ASAP with imaging.   Tye Savoy , NP 02/26/2017, 3:33 PM   Addendum: Reviewed and agree with management.  Plan for colonoscopy, already scheduled, to evaluate disease activity.  She has remained interested in ostomy reversal which I think would be a bad  idea unless colitis is in complete remission.  Her setons have been removed by Dr. Sheryn Bison. Await Humira antibody level and findings at colonoscopy and then we will discuss escalation of medical therapy as appropriate Pyrtle, Lajuan Lines, MD

## 2017-02-26 NOTE — Telephone Encounter (Signed)
Ok, sound like flare of her known IBD She has a diverting ileostomy Would need systemic therapy if when seen today it appears this is an acute flare Would check CBC, CRP, CMP, Humira (adalimbumab level and antibody) levels, and c diff PCR if she is having increased drainage from her rectum Depending on exam, consider CT abd/pelvis Will need colonoscopy at some point to evaluate disease activity, however, would try acute flare 1st if that is what is felt to be going on now

## 2017-02-26 NOTE — Telephone Encounter (Signed)
Note sent to Tye Savoy NP who will be seeing the pt this afternoon.

## 2017-02-26 NOTE — Patient Instructions (Signed)
If you are age 74 or older, your body mass index should be between 23-30. Your Body mass index is 24.2 kg/m. If this is out of the aforementioned range listed, please consider follow up with your Primary Care Provider.  If you are age 28 or younger, your body mass index should be between 19-25. Your Body mass index is 24.2 kg/m. If this is out of the aformentioned range listed, please consider follow up with your Primary Care Provider.   Your physician has requested that you go to the basement for lab work before leaving today.  Will call with results.    Thank you for choosing me and [facility].   Tye Savoy, NP

## 2017-02-26 NOTE — Telephone Encounter (Signed)
Pt reports she is having more rectal bleeding. States it is similar to a bad period when she was younger. She states she will have a severe stomach cramp and then have the bleeding followed by nausea. Also reports her knees are hurting her so bad she has trouble walking. Offered pt an appt today at 11:15am but she has to have a ride. Pt scheduled to see Tye Savoy NP today at 3pm. Pt aware of appt and Dr. Hilarie Fredrickson notified.

## 2017-03-03 ENCOUNTER — Telehealth: Payer: Self-pay | Admitting: Nurse Practitioner

## 2017-03-03 ENCOUNTER — Encounter: Payer: Self-pay | Admitting: Nurse Practitioner

## 2017-03-03 NOTE — Telephone Encounter (Signed)
Patient is calling for her results.

## 2017-03-03 NOTE — Telephone Encounter (Signed)
Please see if still having dysuria and if so then need to send for a culture. Her CRP is up but not too surprising. If still having pain then we are still recommending CT scan. Please let me know. Thanks

## 2017-03-03 NOTE — Telephone Encounter (Signed)
Spoke with the patient. She expresses a lot of frustration and wants to think about the CT scan. Declines to schedule. She also refuses to return to submit a new urine specimen. Says she is "tired of running back and forth". Reports her symptoms are "unchanged."  She continues to see bleeding and wants to know if she is "to ignore the bleeding." She apoligizes and say this is not directed at me, but she is extremely frustrated. She also asks that I document that she is very disappointed that she was not called sooner with her lab results.

## 2017-03-04 NOTE — Telephone Encounter (Signed)
Hemoglobin normal when checked last week Awaiting Humira antibodies Needs to have colonoscopy before we can decide how to change management/determine severity of her colitis and further evaluate the rectal bleeding. I am sorry that she is frustrated, but we need the information from the colonoscopy before we can make management decisions regarding her colitis.

## 2017-03-04 NOTE — Telephone Encounter (Signed)
Spoke with pt and she is aware, she did not know we were waiting on the Humira antibody. Pt reports she is just feeling poorly. Still having issues with aching joints and a few days ago she started having issues with nausea. Pt state she vomited last evening. She has some zofran and she will start taking the ODT zofran now since she knows it is ok to take this. She knows that the colonoscopy needs to be done to understand how to manage/treat her colitis.

## 2017-03-05 ENCOUNTER — Telehealth: Payer: Self-pay

## 2017-03-05 LAB — ADALIMUMAB LEVEL AND ANTIBODY
Adalimumab Drug Level: 3.3 ug/mL
Anti-Adalimumab Antibody: 106 ng/mL

## 2017-03-05 NOTE — Telephone Encounter (Signed)
Thanks!   Riki Sheer, LPN

## 2017-03-05 NOTE — Telephone Encounter (Signed)
Dr. Hilarie Fredrickson,  In preparing Sara Barnes chart for Pre-Visit I noticed that she is scheduled for a colonoscopy, I realize that she has an ileostomy, but the note says to only use two fleets enemas for the prep two to three hours prior to procedure. The last flex sig using magnesium citrate and fleets enema were just adequate. Please confirm that it is ok to use two fleets enemas and no oral prep.  Thanks.  Riki Sheer, LPN  ( [facility] )

## 2017-03-05 NOTE — Telephone Encounter (Signed)
Her stomach is not in communication with her colon and therefore no oral prep is possible Enemas only Thanks

## 2017-03-10 ENCOUNTER — Other Ambulatory Visit: Payer: Self-pay

## 2017-03-10 MED ORDER — CERTOLIZUMAB PEGOL 6 X 200 MG/ML ~~LOC~~ KIT: PACK | SUBCUTANEOUS | 0 refills | Status: DC

## 2017-03-10 MED ORDER — CERTOLIZUMAB PEGOL 2 X 200 MG/ML ~~LOC~~ KIT: 400.0000 mg | PACK | SUBCUTANEOUS | 11 refills | Status: DC

## 2017-03-11 ENCOUNTER — Ambulatory Visit (AMBULATORY_SURGERY_CENTER): Payer: Self-pay | Admitting: *Deleted

## 2017-03-11 ENCOUNTER — Other Ambulatory Visit: Payer: Self-pay

## 2017-03-11 VITALS — Ht 66.0 in | Wt 153.6 lb

## 2017-03-11 DIAGNOSIS — R1084 Generalized abdominal pain: Secondary | ICD-10-CM

## 2017-03-11 NOTE — Progress Notes (Signed)
No allergies to eggs or soy. Nausea with anesthesia  Pt given Emmi instructions for colonoscopy  No oxygen use  No diet drug use

## 2017-03-12 ENCOUNTER — Encounter: Payer: Self-pay | Admitting: Internal Medicine

## 2017-03-13 DIAGNOSIS — K5090 Crohn's disease, unspecified, without complications: Secondary | ICD-10-CM | POA: Diagnosis not present

## 2017-03-13 DIAGNOSIS — Z932 Ileostomy status: Secondary | ICD-10-CM | POA: Diagnosis not present

## 2017-03-17 ENCOUNTER — Telehealth: Payer: Self-pay | Admitting: Internal Medicine

## 2017-03-17 MED ORDER — ONDANSETRON 4 MG PO TBDP: 4.0000 mg | ORAL_TABLET | Freq: Four times a day (QID) | ORAL | 1 refills | Status: DC | PRN

## 2017-03-17 NOTE — Telephone Encounter (Signed)
Okay for zofran ODT 4 mg every 6 hr PRN nausea #90 Refill 1

## 2017-03-17 NOTE — Telephone Encounter (Signed)
Dr Hilarie Fredrickson, please advise.... I dont see where zofran has been prescribed in at least a year (in our system at least).Marland KitchenMarland Kitchen

## 2017-03-17 NOTE — Telephone Encounter (Signed)
Rx sent 

## 2017-03-23 ENCOUNTER — Inpatient Hospital Stay (HOSPITAL_COMMUNITY)
Admission: EM | Admit: 2017-03-23 | Discharge: 2017-03-31 | DRG: 329 | Disposition: A | Discharge status: Home Health | Payer: Medicare HMO | Attending: General Surgery | Admitting: General Surgery

## 2017-03-23 ENCOUNTER — Emergency Department (HOSPITAL_COMMUNITY): Payer: Medicare HMO

## 2017-03-23 ENCOUNTER — Encounter: Payer: Self-pay | Admitting: Internal Medicine

## 2017-03-23 ENCOUNTER — Encounter (HOSPITAL_COMMUNITY): Admission: EM | Disposition: A | Discharge status: Home Health | Payer: Self-pay | Source: Home / Self Care

## 2017-03-23 ENCOUNTER — Emergency Department (HOSPITAL_COMMUNITY): Payer: Medicare HMO | Admitting: Anesthesiology

## 2017-03-23 ENCOUNTER — Ambulatory Visit (AMBULATORY_SURGERY_CENTER): Payer: Medicare HMO | Admitting: Internal Medicine

## 2017-03-23 ENCOUNTER — Other Ambulatory Visit: Payer: Self-pay

## 2017-03-23 ENCOUNTER — Encounter (HOSPITAL_COMMUNITY): Payer: Self-pay

## 2017-03-23 VITALS — BP 148/71 | HR 68 | Temp 96.0°F | Resp 12 | Ht 66.0 in | Wt 153.0 lb

## 2017-03-23 DIAGNOSIS — K529 Noninfective gastroenteritis and colitis, unspecified: Secondary | ICD-10-CM | POA: Diagnosis not present

## 2017-03-23 DIAGNOSIS — Z85828 Personal history of other malignant neoplasm of skin: Secondary | ICD-10-CM

## 2017-03-23 DIAGNOSIS — R2689 Other abnormalities of gait and mobility: Secondary | ICD-10-CM | POA: Diagnosis not present

## 2017-03-23 DIAGNOSIS — K9171 Accidental puncture and laceration of a digestive system organ or structure during a digestive system procedure: Secondary | ICD-10-CM | POA: Diagnosis present

## 2017-03-23 DIAGNOSIS — Z9889 Other specified postprocedural states: Secondary | ICD-10-CM

## 2017-03-23 DIAGNOSIS — F329 Major depressive disorder, single episode, unspecified: Secondary | ICD-10-CM | POA: Diagnosis not present

## 2017-03-23 DIAGNOSIS — K50918 Crohn's disease, unspecified, with other complication: Secondary | ICD-10-CM | POA: Diagnosis not present

## 2017-03-23 DIAGNOSIS — Z881 Allergy status to other antibiotic agents status: Secondary | ICD-10-CM

## 2017-03-23 DIAGNOSIS — R270 Ataxia, unspecified: Secondary | ICD-10-CM | POA: Diagnosis not present

## 2017-03-23 DIAGNOSIS — K632 Fistula of intestine: Secondary | ICD-10-CM | POA: Diagnosis not present

## 2017-03-23 DIAGNOSIS — Z90710 Acquired absence of both cervix and uterus: Secondary | ICD-10-CM | POA: Diagnosis not present

## 2017-03-23 DIAGNOSIS — R198 Other specified symptoms and signs involving the digestive system and abdomen: Secondary | ICD-10-CM | POA: Diagnosis not present

## 2017-03-23 DIAGNOSIS — K50111 Crohn's disease of large intestine with rectal bleeding: Secondary | ICD-10-CM | POA: Diagnosis not present

## 2017-03-23 DIAGNOSIS — Z48815 Encounter for surgical aftercare following surgery on the digestive system: Secondary | ICD-10-CM | POA: Diagnosis not present

## 2017-03-23 DIAGNOSIS — Z7952 Long term (current) use of systemic steroids: Secondary | ICD-10-CM

## 2017-03-23 DIAGNOSIS — R262 Difficulty in walking, not elsewhere classified: Secondary | ICD-10-CM | POA: Diagnosis not present

## 2017-03-23 DIAGNOSIS — K219 Gastro-esophageal reflux disease without esophagitis: Secondary | ICD-10-CM | POA: Diagnosis present

## 2017-03-23 DIAGNOSIS — M6281 Muscle weakness (generalized): Secondary | ICD-10-CM | POA: Diagnosis not present

## 2017-03-23 DIAGNOSIS — K56609 Unspecified intestinal obstruction, unspecified as to partial versus complete obstruction: Secondary | ICD-10-CM | POA: Diagnosis not present

## 2017-03-23 DIAGNOSIS — K668 Other specified disorders of peritoneum: Secondary | ICD-10-CM

## 2017-03-23 DIAGNOSIS — K624 Stenosis of anus and rectum: Secondary | ICD-10-CM | POA: Diagnosis present

## 2017-03-23 DIAGNOSIS — I5032 Chronic diastolic (congestive) heart failure: Secondary | ICD-10-CM | POA: Diagnosis present

## 2017-03-23 DIAGNOSIS — Z853 Personal history of malignant neoplasm of breast: Secondary | ICD-10-CM | POA: Diagnosis not present

## 2017-03-23 DIAGNOSIS — K5010 Crohn's disease of large intestine without complications: Secondary | ICD-10-CM | POA: Diagnosis present

## 2017-03-23 DIAGNOSIS — Z885 Allergy status to narcotic agent status: Secondary | ICD-10-CM | POA: Diagnosis not present

## 2017-03-23 DIAGNOSIS — Z932 Ileostomy status: Secondary | ICD-10-CM | POA: Diagnosis not present

## 2017-03-23 DIAGNOSIS — H9190 Unspecified hearing loss, unspecified ear: Secondary | ICD-10-CM | POA: Diagnosis not present

## 2017-03-23 DIAGNOSIS — K631 Perforation of intestine (nontraumatic): Secondary | ICD-10-CM | POA: Diagnosis not present

## 2017-03-23 DIAGNOSIS — R109 Unspecified abdominal pain: Secondary | ICD-10-CM | POA: Diagnosis not present

## 2017-03-23 DIAGNOSIS — R279 Unspecified lack of coordination: Secondary | ICD-10-CM | POA: Diagnosis not present

## 2017-03-23 DIAGNOSIS — Z79899 Other long term (current) drug therapy: Secondary | ICD-10-CM

## 2017-03-23 DIAGNOSIS — F411 Generalized anxiety disorder: Secondary | ICD-10-CM | POA: Diagnosis not present

## 2017-03-23 DIAGNOSIS — Z888 Allergy status to other drugs, medicaments and biological substances status: Secondary | ICD-10-CM | POA: Diagnosis not present

## 2017-03-23 DIAGNOSIS — K51014 Ulcerative (chronic) pancolitis with abscess: Secondary | ICD-10-CM | POA: Diagnosis not present

## 2017-03-23 DIAGNOSIS — K5090 Crohn's disease, unspecified, without complications: Secondary | ICD-10-CM | POA: Diagnosis present

## 2017-03-23 DIAGNOSIS — K625 Hemorrhage of anus and rectum: Secondary | ICD-10-CM | POA: Diagnosis not present

## 2017-03-23 DIAGNOSIS — I319 Disease of pericardium, unspecified: Secondary | ICD-10-CM | POA: Diagnosis not present

## 2017-03-23 HISTORY — PX: LAPAROSCOPY: SHX197

## 2017-03-23 LAB — TYPE AND SCREEN
ABO/RH(D): A NEG
ANTIBODY SCREEN: NEGATIVE

## 2017-03-23 LAB — I-STAT CHEM 8, ED
BUN: 5 mg/dL — AB (ref 6–20)
CHLORIDE: 106 mmol/L (ref 101–111)
CREATININE: 0.7 mg/dL (ref 0.44–1.00)
Calcium, Ion: 1.06 mmol/L — ABNORMAL LOW (ref 1.15–1.40)
GLUCOSE: 96 mg/dL (ref 65–99)
HCT: 46 % (ref 36.0–46.0)
Hemoglobin: 15.6 g/dL — ABNORMAL HIGH (ref 12.0–15.0)
POTASSIUM: 3.6 mmol/L (ref 3.5–5.1)
Sodium: 139 mmol/L (ref 135–145)
TCO2: 21 mmol/L — ABNORMAL LOW (ref 22–32)

## 2017-03-23 LAB — COMPREHENSIVE METABOLIC PANEL
ALK PHOS: 85 U/L (ref 38–126)
ALT: 16 U/L (ref 14–54)
AST: 23 U/L (ref 15–41)
Albumin: 3.5 g/dL (ref 3.5–5.0)
Anion gap: 7 (ref 5–15)
BILIRUBIN TOTAL: 1 mg/dL (ref 0.3–1.2)
BUN: 7 mg/dL (ref 6–20)
CO2: 22 mmol/L (ref 22–32)
CREATININE: 0.81 mg/dL (ref 0.44–1.00)
Calcium: 8.7 mg/dL — ABNORMAL LOW (ref 8.9–10.3)
Chloride: 107 mmol/L (ref 101–111)
GFR calc Af Amer: >60 mL/min (ref >60)
Glucose, Bld: 99 mg/dL (ref 65–99)
POTASSIUM: 3.5 mmol/L (ref 3.5–5.1)
Sodium: 136 mmol/L (ref 135–145)
TOTAL PROTEIN: 6.7 g/dL (ref 6.5–8.1)

## 2017-03-23 LAB — CBC WITH DIFFERENTIAL/PLATELET
BASOS ABS: 0 10*3/uL (ref 0.0–0.1)
BASOS PCT: 0 %
EOS ABS: 0.1 10*3/uL (ref 0.0–0.7)
EOS PCT: 1 %
HEMATOCRIT: 43.7 % (ref 36.0–46.0)
Hemoglobin: 14.8 g/dL (ref 12.0–15.0)
LYMPHS PCT: 13 %
Lymphs Abs: 1.9 10*3/uL (ref 0.7–4.0)
MCH: 33.3 pg (ref 26.0–34.0)
MCHC: 33.9 g/dL (ref 30.0–36.0)
MCV: 98.4 fL (ref 78.0–100.0)
MONO ABS: 0.9 10*3/uL (ref 0.1–1.0)
Monocytes Relative: 6 %
Neutro Abs: 11.7 10*3/uL — ABNORMAL HIGH (ref 1.7–7.7)
Neutrophils Relative %: 80 %
Platelets: 230 10*3/uL (ref 150–400)
RBC: 4.44 MIL/uL (ref 3.87–5.11)
RDW: 12.7 % (ref 11.5–15.5)
WBC: 14.6 10*3/uL — AB (ref 4.0–10.5)

## 2017-03-23 LAB — PROTIME-INR
INR: 1.03
PROTHROMBIN TIME: 13.4 s (ref 11.4–15.2)

## 2017-03-23 LAB — I-STAT CG4 LACTIC ACID, ED: Lactic Acid, Venous: 1.11 mmol/L (ref 0.5–1.9)

## 2017-03-23 SURGERY — LAPAROSCOPY, DIAGNOSTIC
Anesthesia: General | Site: Abdomen

## 2017-03-23 MED ORDER — IOPAMIDOL (ISOVUE-300) INJECTION 61%
INTRAVENOUS | Status: AC
  Filled 2017-03-23: qty 30

## 2017-03-23 MED ORDER — METHYLPREDNISOLONE SODIUM SUCC 125 MG IJ SOLR
125.0000 mg | Freq: Once | INTRAMUSCULAR | Status: DC
  Administered 2017-03-23: 125 mg via INTRAVENOUS
  Filled 2017-03-23: qty 2

## 2017-03-23 MED ORDER — SODIUM CHLORIDE 0.9 % IV SOLN: 500.0000 mL | INTRAVENOUS | Status: DC

## 2017-03-23 MED ORDER — BUPIVACAINE HCL (PF) 0.5 % IJ SOLN
INTRAMUSCULAR | Status: DC | PRN
  Administered 2017-03-23: 10 mL

## 2017-03-23 MED ORDER — SODIUM CHLORIDE 0.9 % IV SOLN
Freq: Once | INTRAVENOUS | Status: AC
  Administered 2017-03-23: 21:00:00 via INTRAVENOUS

## 2017-03-23 MED ORDER — LACTATED RINGERS IR SOLN
Status: DC | PRN
  Administered 2017-03-23: 1

## 2017-03-23 MED ORDER — 0.9 % SODIUM CHLORIDE (POUR BTL) OPTIME
TOPICAL | Status: DC | PRN
  Administered 2017-03-23: 4000 mL

## 2017-03-23 MED ORDER — FENTANYL CITRATE (PF) 100 MCG/2ML IJ SOLN
25.0000 ug | Freq: Once | INTRAMUSCULAR | Status: AC
  Administered 2017-03-23: 25 ug via INTRAVENOUS
  Filled 2017-03-23: qty 2

## 2017-03-23 MED ORDER — SUCCINYLCHOLINE CHLORIDE 200 MG/10ML IV SOSY
PREFILLED_SYRINGE | INTRAVENOUS | Status: DC | PRN
  Administered 2017-03-23: 100 mg via INTRAVENOUS

## 2017-03-23 MED ORDER — PIPERACILLIN-TAZOBACTAM 3.375 G IVPB 30 MIN
3.3750 g | Freq: Once | INTRAVENOUS | Status: AC
  Administered 2017-03-23: 3.375 g via INTRAVENOUS
  Filled 2017-03-23: qty 50

## 2017-03-23 MED ORDER — LIDOCAINE 2% (20 MG/ML) 5 ML SYRINGE
INTRAMUSCULAR | Status: DC | PRN
  Administered 2017-03-23: 50 mg via INTRAVENOUS

## 2017-03-23 MED ORDER — BUPIVACAINE HCL (PF) 0.5 % IJ SOLN
INTRAMUSCULAR | Status: AC
  Filled 2017-03-23: qty 30

## 2017-03-23 MED ORDER — VANCOMYCIN HCL 10 G IV SOLR
1500.0000 mg | Freq: Once | INTRAVENOUS | Status: AC
  Administered 2017-03-23: 1500 mg via INTRAVENOUS
  Filled 2017-03-23: qty 1500

## 2017-03-23 MED ORDER — DEXAMETHASONE SODIUM PHOSPHATE 10 MG/ML IJ SOLN
INTRAMUSCULAR | Status: DC | PRN
  Administered 2017-03-23: 10 mg via INTRAVENOUS

## 2017-03-23 MED ORDER — ONDANSETRON HCL 4 MG/2ML IJ SOLN
4.0000 mg | Freq: Once | INTRAMUSCULAR | Status: AC
  Administered 2017-03-23: 4 mg via INTRAVENOUS
  Filled 2017-03-23: qty 2

## 2017-03-23 MED ORDER — ROCURONIUM BROMIDE 10 MG/ML (PF) SYRINGE
PREFILLED_SYRINGE | INTRAVENOUS | Status: DC | PRN
  Administered 2017-03-23: 10 mg via INTRAVENOUS
  Administered 2017-03-23: 40 mg via INTRAVENOUS
  Administered 2017-03-23: 10 mg via INTRAVENOUS

## 2017-03-23 MED ORDER — PROPOFOL 10 MG/ML IV BOLUS
INTRAVENOUS | Status: DC | PRN
  Administered 2017-03-23: 100 mg via INTRAVENOUS

## 2017-03-23 MED ORDER — SODIUM CHLORIDE 0.9 % IV BOLUS (SEPSIS)
1000.0000 mL | Freq: Once | INTRAVENOUS | Status: AC
  Administered 2017-03-23: 1000 mL via INTRAVENOUS

## 2017-03-23 MED ORDER — LACTATED RINGERS IV SOLN
INTRAVENOUS | Status: DC | PRN
  Administered 2017-03-23: 22:00:00 via INTRAVENOUS

## 2017-03-23 MED ORDER — FENTANYL CITRATE (PF) 100 MCG/2ML IJ SOLN
INTRAMUSCULAR | Status: DC | PRN
  Administered 2017-03-23 – 2017-03-24 (×7): 50 ug via INTRAVENOUS

## 2017-03-23 SURGICAL SUPPLY — 52 items
APL SKNCLS STERI-STRIP NONHPOA (GAUZE/BANDAGES/DRESSINGS)
BENZOIN TINCTURE PRP APPL 2/3 (GAUZE/BANDAGES/DRESSINGS) IMPLANT
BLADE EXTENDED COATED 6.5IN (ELECTRODE) ×3 IMPLANT
BLADE HEX COATED 2.75 (ELECTRODE) ×3 IMPLANT
CHLORAPREP W/TINT 26ML (MISCELLANEOUS) ×3 IMPLANT
CLOSURE WOUND 1/2 X4 (GAUZE/BANDAGES/DRESSINGS)
DECANTER SPIKE VIAL GLASS SM (MISCELLANEOUS) IMPLANT
DRAPE UTILITY XL STRL (DRAPES) ×3 IMPLANT
ELECT PENCIL ROCKER SW 15FT (MISCELLANEOUS) ×3 IMPLANT
ELECT REM PT RETURN 15FT ADLT (MISCELLANEOUS) ×3 IMPLANT
GAUZE SPONGE 4X4 12PLY STRL (GAUZE/BANDAGES/DRESSINGS) ×3 IMPLANT
GLOVE BIOGEL PI IND STRL 7.0 (GLOVE) ×2 IMPLANT
GLOVE BIOGEL PI IND STRL 8.5 (GLOVE) ×1 IMPLANT
GLOVE BIOGEL PI INDICATOR 7.0 (GLOVE) ×4
GLOVE BIOGEL PI INDICATOR 8.5 (GLOVE) ×2
GLOVE ECLIPSE 8.0 STRL XLNG CF (GLOVE) ×12 IMPLANT
GLOVE INDICATOR 8.0 STRL GRN (GLOVE) ×6 IMPLANT
GLOVE SURG SS PI 6.5 STRL IVOR (GLOVE) ×6 IMPLANT
GLOVE SURG SS PI 8.5 STRL IVOR (GLOVE) ×4
GLOVE SURG SS PI 8.5 STRL STRW (GLOVE) ×2 IMPLANT
GOWN STRL REUS W/TWL LRG LVL3 (GOWN DISPOSABLE) ×3 IMPLANT
GOWN STRL REUS W/TWL XL LVL3 (GOWN DISPOSABLE) ×6 IMPLANT
HANDLE SUCTION POOLE (INSTRUMENTS) ×1 IMPLANT
KIT BASIN OR (CUSTOM PROCEDURE TRAY) ×3 IMPLANT
LIGASURE IMPACT 36 18CM CVD LR (INSTRUMENTS) ×3 IMPLANT
NS IRRIG 1000ML POUR BTL (IV SOLUTION) ×12 IMPLANT
PAD ABD 8X10 STRL (GAUZE/BANDAGES/DRESSINGS) ×3 IMPLANT
SET IRRIG TUBING LAPAROSCOPIC (IRRIGATION / IRRIGATOR) ×3 IMPLANT
SHEARS HARMONIC ACE PLUS 36CM (ENDOMECHANICALS) IMPLANT
SOLUTION ANTI FOG 6CC (MISCELLANEOUS) ×3 IMPLANT
SPONGE LAP 18X18 X RAY DECT (DISPOSABLE) ×9 IMPLANT
STAPLER PROXIMATE 75MM BLUE (STAPLE) ×3 IMPLANT
STRIP CLOSURE SKIN 1/2X4 (GAUZE/BANDAGES/DRESSINGS) IMPLANT
SUCTION POOLE HANDLE (INSTRUMENTS) ×3
SUT NOV 1 T60/GS (SUTURE) ×6 IMPLANT
SUT PROLENE 2 0 SH DA (SUTURE) ×3 IMPLANT
SUT SILK 2 0 SH CR/8 (SUTURE) ×3 IMPLANT
SUT SILK 3 0 SH CR/8 (SUTURE) ×3 IMPLANT
SUT VIC AB 4-0 PS2 27 (SUTURE) IMPLANT
SUT VICRYL 2 0 18  UND BR (SUTURE) ×2
SUT VICRYL 2 0 18 UND BR (SUTURE) ×1 IMPLANT
TAPE CLOTH SURG 4X10 WHT LF (GAUZE/BANDAGES/DRESSINGS) ×3 IMPLANT
TOWEL OR 17X26 10 PK STRL BLUE (TOWEL DISPOSABLE) ×6 IMPLANT
TOWEL OR NON WOVEN STRL DISP B (DISPOSABLE) ×3 IMPLANT
TRAY FOLEY W/METER SILVER 16FR (SET/KITS/TRAYS/PACK) ×3 IMPLANT
TRAY LAPAROSCOPIC (CUSTOM PROCEDURE TRAY) ×3 IMPLANT
TROCAR XCEL BLUNT TIP 100MML (ENDOMECHANICALS) ×3 IMPLANT
TROCAR XCEL NON-BLD 11X100MML (ENDOMECHANICALS) IMPLANT
TROCAR XCEL UNIV SLVE 11M 100M (ENDOMECHANICALS) IMPLANT
TUBING INSUF HEATED (TUBING) ×3 IMPLANT
YANKAUER SUCT BULB TIP 10FT TU (MISCELLANEOUS) ×3 IMPLANT
YANKAUER SUCT BULB TIP NO VENT (SUCTIONS) ×3 IMPLANT

## 2017-03-23 NOTE — Progress Notes (Signed)
Pt's states no medical or surgical changes since previsit or office visit. 

## 2017-03-23 NOTE — Anesthesia Procedure Notes (Signed)
Procedure Name: Intubation Date/Time: 03/23/2017 10:28 PM Performed by: Anne Fu, CRNA Pre-anesthesia Checklist: Patient identified, Emergency Drugs available, Suction available, Patient being monitored and Timeout performed Patient Re-evaluated:Patient Re-evaluated prior to induction Oxygen Delivery Method: Circle system utilized Preoxygenation: Pre-oxygenation with 100% oxygen Induction Type: IV induction Ventilation: Mask ventilation without difficulty Laryngoscope Size: Mac and 4 Grade View: Grade II Tube type: Oral Tube size: 7.0 mm Number of attempts: 1 Airway Equipment and Method: Stylet Placement Confirmation: ETT inserted through vocal cords under direct vision,  positive ETCO2 and breath sounds checked- equal and bilateral Secured at: 21 cm Tube secured with: Tape Dental Injury: Teeth and Oropharynx as per pre-operative assessment

## 2017-03-23 NOTE — Op Note (Signed)
[facility] Patient Name: Sara Barnes Procedure Date: 03/23/2017 3:03 PM MRN: [id number] Endoscopist: Jerene Bears , MD Age: 76 Referring MD:  Date of Birth: [birth year] Gender: Female Account #: [id number] Procedure:                Colonoscopy Indications:              Crohn's disease of the colon with perianal                            involvement, Disease activity assessment of Crohn's                            disease of the colon, s/p diverting end ileostomy,                            recent discovery of Humira antibodies Medicines:                Monitored Anesthesia Care Procedure:                Pre-Anesthesia Assessment:                           - Prior to the procedure, a History and Physical                            was performed, and patient medications and                            allergies were reviewed. The patient's tolerance of                            previous anesthesia was also reviewed. The risks                            and benefits of the procedure and the sedation                            options and risks were discussed with the patient.                            All questions were answered, and informed consent                            was obtained. Prior Anticoagulants: The patient has                            taken no previous anticoagulant or antiplatelet                            agents. ASA Grade Assessment: III - A patient with                            severe systemic disease. After reviewing the risks  and benefits, the patient was deemed in                            satisfactory condition to undergo the procedure.                           After obtaining informed consent, the colonoscope                            was passed under direct vision. Throughout the                            procedure, the patient's blood pressure, pulse, and                            oxygen saturations were  monitored continuously. The                            Colonoscope was introduced through the anus with                            the intention of advancing to the cecum. The scope                            was advanced to the descending colon before the                            procedure was aborted. Medications were given. The                            colonoscopy was performed without difficulty. The                            patient tolerated the procedure well. The quality                            of the bowel preparation was adequate. The rectum                            was photographed. Scope In: 3:09:29 PM Scope Out: 3:15:24 PM Total Procedure Duration: 0 hours 5 minutes 55 seconds  Findings:                 The perianal exam findings include non-thrombosed                            external hemorrhoids.                           The digital rectal exam findings include anal                            stricture, though the pediatric colonoscopy was  introduced without resistance.                           Inflammation characterized by adherent blood,                            congestion (edema), erosions, erythema, friability                            with contact bleeding, granularity and loss of                            vascularity was found as large patches surrounded                            by normal mucosa in the rectum, and more confluent                            in the sigmoid colon and in the descending colon.                            This was severe, and when compared to previous                            examinations, the findings are worsened. Biopsies                            were taken with a cold forceps for histology. Due                            to the severity of inflammation and contact                            bleeding the exam was aborted before reaching the                            right colon.                            A fistula was found in the distal rectum near                            dentate line. 2 perianal skin tags versus skin tags                            were seen in the anal canal. Retroflexion was not                            performed due to proctitis and narrowed rectal                            vault. Complications:            No immediate complications. Estimated Blood Loss:  Estimated blood loss was minimal. Impression:               - Non-thrombosed external hemorrhoid and mild                            stenosis of the anal canal found on perianal exam                           - Crohn's disease with colonic involvement.                            Inflammation was found in the rectum, in the                            sigmoid colon and in the descending colon. This was                            severe. The findings are worsened compared to                            previous examinations. Biopsied.                           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                           - Right colon not seen due to severity of                            inflammation. Recommendation:           - Patient has a contact number available for                            emergencies. The signs and symptoms of potential                            delayed complications were discussed with the                            patient. Return to normal activities tomorrow.                            Written discharge instructions were provided to the                            patient.                           - Soft diet.                           - Continue present medications. Begin Cimzia with  induction, ASAP.                           - Increase prednisone back to 40 mg daily x 7 days,                            35 mg x 7 days, 30 mg x 7 days, 25 mg x 7 days and                             then 20 mg. Continue 20 mg for 4 weeks and we will                            then discuss taper once Cimzia is in place.                           - Await pathology results.                           - Repeat colonoscopy is recommended. The                            colonoscopy date will be determined after pathology                            results from today's exam become available for                            review. Jerene Bears, MD 03/23/2017 3:42:37 PM This report has been signed electronically.

## 2017-03-23 NOTE — H&P (Signed)
Sara Barnes is an 76 y.o. female.   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 She was on Humira but stopped that 3 weeks ago. She has also been on prednisone. She's had chronic right lower quadrant pain, rectal bleeding, nausea for the past 6 weeks by her report. She underwent a colonoscopy midafternoon today. Following that she had increased rectal bleeding. During the colonoscopy severe inflammatory changes of the rectum sigmoid colon and descending colon were seen. The colonoscopy was stopped at the level of the descending colon because of the severe inflammatory changes. Some cold biopsies were taken of the mucosa of the descending colon. Because there was progression of her disease and she had increased rectal bleeding on contact with the scope, she was sent to [facility] emergency department. The plan was that she was to be admitted for high-dose IV steroids. She was complaining of her usual chronic right lower quadrant pain that may be a little worse and so acute abdominal x-ray series was performed. This suggested a pneumoperitoneum on the left lateral decubitus view. She was started on broad-spectrum antibiotics. Steroids were not started. I was asked to see her.  Currently she says she feels a little more distended than she did after the colonoscopy. She has her persistent chronic right lower quadrant pain. She is not in any acute distress. Her sister is with her at the bedside.  CT scan demonstrated a large amount of air around the cecum with a suggestion of a defect in the cecum. They are tracks up the right side above the liver and up toward the mediastinum. Past Medical History:  Diagnosis Date  . Allergy   . Anemia   . Basal cell carcinoma   . Blood transfusion without reported diagnosis   . 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 Complication of anesthesia    anxiety post-op after ear surgeries  . Crohn's disease of large intestine with abscess ([place])   . Dental crowns present    recent root canal 11/2014  . Depression   . Family history of adverse reaction to anesthesia    pt's sister has hx. of post-op N/V  . GERD (gastroesophageal reflux disease)   . Indigestion   . PONV (postoperative nausea and vomiting)   . Runny nose 12/07/2014   clear drainage, per pt.  . Seasonal allergies   . Ulcerative colitis   . Vitamin B12 deficiency    History of afib  Past Surgical History:  Procedure Laterality Date  . ABDOMINAL HYSTERECTOMY     partial  . APPENDECTOMY    . BASAL CELL CARCINOMA EXCISION Left    nose  . BREAST ENHANCEMENT SURGERY    . BREAST LUMPECTOMY Left    age 55's  . BREAST LUMPECTOMY WITH RADIOACTIVE SEED AND SENTINEL LYMPH NODE BIOPSY Left 12/13/2014   Procedure: LEFT BREAST LUMPECTOMY WITH RADIOACTIVE SEED AND SENTINEL LYMPH NODE BIOPSY;  Surgeon: [name], MD;  Location: [facility];  Service: General;  Laterality: Left;  . COLONOSCOPY WITH PROPOFOL  05/28/2011; 06/28/2014  . FLEXIBLE SIGMOIDOSCOPY N/A 01/08/2016   Procedure: FLEXIBLE SIGMOIDOSCOPY;  Surgeon: [name], MD;  Location: [facility];  Service: Gastroenterology;  Laterality: N/A;  needs MAC due to pain and anxiety  . INCISION AND DRAINAGE ABSCESS  2018  . INCISION AND DRAINAGE PERIRECTAL ABSCESS N/A 09/28/2013   Procedure: IRRIGATION AND DEBRIDEMENT PERIANAL ABSCESS, proctoscopy;  Surgeon: Shann Medal, MD;  Location: [facility];  Service: General;  Laterality: N/A;  . INCISION AND DRAINAGE PERIRECTAL ABSCESS N/A 01/22/2016   Procedure: IRRIGATION AND DEBRIDEMENT PERIRECTAL ABSCESS;  Surgeon: Armandina Gemma, MD;  Location: [facility];  Service: General;  Laterality: N/A;  . INNER EAR SURGERY Bilateral   . LAPAROSCOPIC DIVERTED COLOSTOMY  N/A 02/11/2016   Procedure: LAPAROSCOPIC DIVERTED ILEOSTOMY;  Surgeon: Johnathan Hausen, MD;  Location: [facility];  Service: General;  Laterality: N/A;    Family History  Problem Relation Age of Onset  . Prostate cancer Brother 106  . Kidney disease Brother   . Diabetes Brother   . Brain cancer Father   . Rheum arthritis Mother   . Anesthesia problems Sister        post-op N/V  . Emphysema Maternal Grandfather   . Asthma Sister   . Rheum arthritis Brother   . Colon cancer Neg Hx   . Esophageal cancer Neg Hx   . Breast cancer Neg Hx    Social History:  reports that  has never smoked. she has never used smokeless tobacco. She reports that she drinks about 8.4 oz of alcohol per week. She reports that she does not use drugs.  Allergies:  Allergies  Allergen Reactions  . Codeine Nausea And Vomiting  . Mesalamine Nausea And Vomiting  . Other Itching    PERFUMED LOTIONS  Cant have MRI due to ear implant   . Oxycodone-Acetaminophen Rash    Itching & rash Tolerates Dilaudid  . Sulfa Antibiotics Rash     (Not in a hospital admission)  Results for orders placed or performed during the hospital encounter of 03/23/17 (from the past 48 hour(s))  Type and screen [place]     Status: None   Collection Time: 03/23/17  8:02 PM  Result Value Ref Range   ABO/RH(D) A NEG    Antibody Screen NEG    Sample Expiration 03/26/2017   CBC with Differential     Status: Abnormal   Collection Time: 03/23/17  8:03 PM  Result Value Ref Range   WBC 14.6 (H) 4.0 - 10.5 K/uL   RBC 4.44 3.87 - 5.11 MIL/uL   Hemoglobin 14.8 12.0 - 15.0 g/dL   HCT 43.7 36.0 - 46.0 %   MCV 98.4 78.0 - 100.0 fL   MCH 33.3 26.0 - 34.0 pg   MCHC 33.9 30.0 - 36.0 g/dL   RDW 12.7 11.5 - 15.5 %   Platelets 230 150 - 400 K/uL   Neutrophils Relative % 80 %   Neutro Abs 11.7 (H) 1.7 - 7.7 K/uL   Lymphocytes Relative 13 %   Lymphs Abs 1.9 0.7 - 4.0 K/uL   Monocytes Relative 6 %   Monocytes Absolute 0.9  0.1 - 1.0 K/uL   Eosinophils Relative 1 %   Eosinophils Absolute 0.1 0.0 - 0.7 K/uL   Basophils Relative 0 %   Basophils Absolute 0.0 0.0 - 0.1 K/uL  Comprehensive metabolic panel     Status: Abnormal   Collection Time: 03/23/17  8:03 PM  Result Value Ref Range   Sodium 136 135 - 145 mmol/L   Potassium 3.5 3.5 - 5.1 mmol/L   Chloride 107 101 - 111 mmol/L   CO2 22 22 - 32 mmol/L   Glucose, Bld 99 65 - 99 mg/dL   BUN 7  6 - 20 mg/dL   Creatinine, Ser 0.81 0.44 - 1.00 mg/dL   Calcium 8.7 (L) 8.9 - 10.3 mg/dL   Total Protein 6.7 6.5 - 8.1 g/dL   Albumin 3.5 3.5 - 5.0 g/dL   AST 23 15 - 41 U/L   ALT 16 14 - 54 U/L   Alkaline Phosphatase 85 38 - 126 U/L   Total Bilirubin 1.0 0.3 - 1.2 mg/dL   GFR calc non Af Amer >60 >60 mL/min   GFR calc Af Amer >60 >60 mL/min    Comment: (NOTE) The eGFR has been calculated using the CKD EPI equation. This calculation has not been validated in all clinical situations. eGFR's persistently <60 mL/min signify possible Chronic Kidney Disease.    Anion gap 7 5 - 15  Protime-INR     Status: None   Collection Time: 03/23/17  8:03 PM  Result Value Ref Range   Prothrombin Time 13.4 11.4 - 15.2 seconds   INR 1.03   I-Stat Chem 8, ED     Status: Abnormal   Collection Time: 03/23/17  8:11 PM  Result Value Ref Range   Sodium 139 135 - 145 mmol/L   Potassium 3.6 3.5 - 5.1 mmol/L   Chloride 106 101 - 111 mmol/L   BUN 5 (L) 6 - 20 mg/dL   Creatinine, Ser 0.70 0.44 - 1.00 mg/dL   Glucose, Bld 96 65 - 99 mg/dL   Calcium, Ion 1.06 (L) 1.15 - 1.40 mmol/L   TCO2 21 (L) 22 - 32 mmol/L   Hemoglobin 15.6 (H) 12.0 - 15.0 g/dL   HCT 46.0 36.0 - 46.0 %  I-Stat CG4 Lactic Acid, ED     Status: None   Collection Time: 03/23/17  8:11 PM  Result Value Ref Range   Lactic Acid, Venous 1.11 0.5 - 1.9 mmol/L   Dg Abdomen Acute W/chest  Result Date: 03/23/2017 CLINICAL DATA:  Abdominal pain status post colonoscopy. EXAM: DG ABDOMEN ACUTE W/ 1V CHEST COMPARISON:   Radiographs of October 30, 2016. FINDINGS: No abnormal bowel gas pattern is noted. Pneumoperitoneum is noted concerning for rupture of hollow viscus. No radiopaque calculi or other significant radiographic abnormality is seen. Heart size and mediastinal contours are within normal limits. Both lungs are clear. IMPRESSION: Pneumoperitoneum is noted on decubitus view concerning for rupture of hollow viscus. No acute cardiopulmonary disease. Critical Value/emergent results were called by telephone at the time of interpretation on 03/23/2017 at 7:13 pm to Dr. Duffy Bruce , who verbally acknowledged these results. Electronically Signed   By: Marijo Conception, M.D.   On: 03/23/2017 19:14    Review of Systems  HENT: Positive for hearing loss.   Respiratory: Negative for shortness of breath.   Cardiovascular: Negative for chest pain.  Gastrointestinal: Positive for abdominal pain and nausea.       Rectal bleeding  Genitourinary: Negative for dysuria.  Endo/Heme/Allergies:       Denies bleeding disorders or blood clots.  Other ROS negative  Blood pressure 138/70, pulse 79, temperature 98 F (36.7 C), temperature source Oral, resp. rate 15, height _0  (1.676 m), weight 69.4 kg (153 lb), SpO2 97 %. Physical Exam  GENERAL APPEARANCE:  WDWN female in NAD.  Pleasant and cooperative.  EARS, NOSE, MOUTH THROAT:  [place]/AT external ears:  no lesions or deformities external nose:  no lesions or deformities hearing:  decreased lips:  moist, no deformities EYES external: conjunctiva, lids, sclerae normal pupils:  equal, round glasses: no  NECK:  Supple, no obvious mass or thyroid mass/enlargement, no trachea deviation  CV ascultation:  RRR, no murmur extremity edema:  no extremity varicosities:  no   RESP auscultation:  breath sounds equal and clear respiratory effort:  normal  BREASTS:  Symmetrical in size.  No dominant masses, nipple discharge or suspicious skin  lesions.  GASTROINTESTINAL abdomen:  Soft, moderate RLQ tenderness with guarding, moderate RUQ tenderness with no guarding, some distension, active bowel sounds, RLQ ileostomy with gas and thin green liquid stool, no masses liver and spleen:  not enlarged. hernia:  none present  SKIN rash or lesion: none subcutaneous nodules:  none  NEUROLOGIC speech:  normal  PSYCHIATRIC alertness and orientation:  normal mood/affect/behavior:  normal judgement and insight:  normal  Assessment/Plan Colonic perforation following colonoscopy. Clinically and by CT the cecum looks like the area of perforation.  Severe Crohn's disease.  Plan: To the operating room tonight for planned laparoscopy/laparotomy with repair perforation possible colon resection. I spoke with her, her son, and her sister about this and the reasons for it.   I  explained the procedure and risks of the operation.  Risks include but are not limited to bleeding, infection, wound problems, anesthesia, intestinal leak, need for reoperative surgery,  injury to intraabominal organs (such as intestine, spleen, kidney, bladder, ureter, etc.), ileus.  She seems to understand and agrees to proceed.  Jackolyn Confer, MD 03/23/2017, 9:32 PM

## 2017-03-23 NOTE — Progress Notes (Signed)
Called to room to assist during endoscopic procedure.  Patient ID and intended procedure confirmed with present staff. Received instructions for my participation in the procedure from the performing physician.  

## 2017-03-23 NOTE — Patient Instructions (Signed)
YOU HAD AN ENDOSCOPIC PROCEDURE TODAY AT [facility]:   Refer to the procedure report that was given to you for any specific questions about what was found during the examination.  If the procedure report does not answer your questions, please call your gastroenterologist to clarify.  If you requested that your care partner not be given the details of your procedure findings, then the procedure report has been included in a sealed envelope for you to review at your convenience later.  YOU SHOULD EXPECT: Some feelings of bloating in the abdomen. Passage of more gas than usual.  Walking can help get rid of the air that was put into your GI tract during the procedure and reduce the bloating. If you had a lower endoscopy (such as a colonoscopy or flexible sigmoidoscopy) you may notice spotting of blood in your stool or on the toilet paper. If you underwent a bowel prep for your procedure, you may not have a normal bowel movement for a few days.  Please Note:  You might notice some irritation and congestion in your nose or some drainage.  This is from the oxygen used during your procedure.  There is no need for concern and it should clear up in a day or so.  SYMPTOMS TO REPORT IMMEDIATELY:   Following lower endoscopy (colonoscopy or flexible sigmoidoscopy):  Excessive amounts of blood in the stool  Significant tenderness or worsening of abdominal pains  Swelling of the abdomen that is new, acute  Fever of 100F or higher   For urgent or emergent issues, a gastroenterologist can be reached at any hour by calling [phone].   DIET:  We do recommend a small meal at first, but then you may proceed to your regular diet.  Drink plenty of fluids but you should avoid alcoholic beverages for 24 hours.  ACTIVITY:  You should plan to take it easy for the rest of today and you should NOT DRIVE or use heavy machinery until tomorrow (because of the sedation medicines used during the test).     FOLLOW UP: Our staff will call the number listed on your records the next business day following your procedure to check on you and address any questions or concerns that you may have regarding the information given to you following your procedure. If we do not reach you, we will leave a message.  However, if you are feeling well and you are not experiencing any problems, there is no need to return our call.  We will assume that you have returned to your regular daily activities without incident.  If any biopsies were taken you will be contacted by phone or by letter within the next 1-3 weeks.  Please call us at [phone] if you have not heard about the biopsies in 3 weeks.    SIGNATURES/CONFIDENTIALITY: You and/or your care partner have signed paperwork which will be entered into your electronic medical record.  These signatures attest to the fact that that the information above on your After Visit Summary has been reviewed and is understood.  Full responsibility of the confidentiality of this discharge information lies with you and/or your care-partner.

## 2017-03-23 NOTE — ED Notes (Signed)
Patient coming from GI-GI MD attempted to directly admit-having a Crohn's flare up-

## 2017-03-23 NOTE — Progress Notes (Addendum)
Patient urinated large amount of straw colored urine. There had been a wash cloth between patient's leg , noted bright red blood, no active bleeding at present or in bedpan. Patient complained of IV discomfort. Checked for flash back, none. Iv dced, 100 ml intake 1650 EMS called for transport. At presnt patient weak but stable. Bright red rectal bleeding noted. Report called to [facility] ED. Discussed with Marzetta Board that the IV is out at present related to infiltration. Marzetta Board stating they will restart the [facility] arrived. Patient transported at 1704.

## 2017-03-23 NOTE — Anesthesia Preprocedure Evaluation (Signed)
Anesthesia Evaluation  Patient identified by MRN, date of birth, ID band Patient awake    Reviewed: Allergy & Precautions, H&P , NPO status , Patient's Chart, lab work & pertinent test results  History of Anesthesia Complications (+) PONV and history of anesthetic complications  Airway Mallampati: II   Neck ROM: full    Dental   Pulmonary    breath sounds clear to auscultation       Cardiovascular  Rhythm:regular Rate:Normal     Neuro/Psych PSYCHIATRIC DISORDERS Anxiety Depression    GI/Hepatic PUD, GERD  ,  Endo/Other    Renal/GU      Musculoskeletal   Abdominal   Peds  Hematology   Anesthesia Other Findings   Reproductive/Obstetrics                             Anesthesia Physical Anesthesia Plan  ASA: II and emergent  Anesthesia Plan: General   Post-op Pain Management:    Induction: Intravenous  PONV Risk Score and Plan: 4 or greater and Ondansetron, Dexamethasone and Treatment may vary due to age or medical condition  Airway Management Planned: Oral ETT  Additional Equipment:   Intra-op Plan:   Post-operative Plan: Extubation in OR  Informed Consent: I have reviewed the patients History and Physical, chart, labs and discussed the procedure including the risks, benefits and alternatives for the proposed anesthesia with the patient or authorized representative who has indicated his/her understanding and acceptance.     Plan Discussed with: CRNA, Anesthesiologist and Surgeon  Anesthesia Plan Comments:         Anesthesia Quick Evaluation

## 2017-03-23 NOTE — Progress Notes (Addendum)
Patient complains of nausea. Dr. Hilarie Fredrickson in to see the patient. Ordering Zofran 4 mg IV. Osvaldo Angst CRNA administered the Zofran 4 mg IV at 1530 1600 Patient stating she does feel better.Dr Hilarie Fredrickson in to see the patient , decision for admission to the hospital. Awaiting orders and room assignment.

## 2017-03-23 NOTE — ED Triage Notes (Signed)
Pt arrives via EMS from [facility]. Pt was in the middle of a colonoscopy when the MD preforming the procedure determined the pt was bleeding too much and her bowels were inflamed due to chrons disease. Pt is here to be treated for chrons to get inflammation under control. A/Ox4.

## 2017-03-23 NOTE — ED Provider Notes (Signed)
[facility] DEPT Provider Note   CSN: [id number] Arrival date & time: 03/23/17  1716     History   Chief Complaint Chief Complaint  Patient presents with  . Post-op Problem    HPI Sara Barnes is a 76 y.o. female.  HPI  76 year old female with extensive past medical history as below including known Crohn's disease who presents with ongoing rectal bleeding.  The patient had a outpatient colonoscopy today which showed diffuse, severe disease without abscess.  She had persistent rectal bleeding after the procedure and was sent to the ED for admission and IV steroids.  She endorses progressively worsening cramp-like, diffuse abdominal and rectal pain for the last 2 weeks.  She has had ongoing, bright red blood per rectum off and on over this time.  She has had associated fatigue and general malaise.  No nausea but has had some chills.  No fevers.  Denies any vomiting.  Denies any other medical complaints.  She does note that her pain decreased after the colonoscopy, but this is not uncommon for her.  Denies any alleviating factors.  Pain is worse with eating and any palpation or movement.  Past Medical History:  Diagnosis Date  . Allergy   . Anemia   . Basal cell carcinoma   . Blood transfusion without reported diagnosis   . Cancer of lower-inner quadrant of female breast ([place]) 11/30/2014   left  . Cancer of lower-inner quadrant of left female breast ([place]) 11/30/2014  . Complication of anesthesia    anxiety post-op after ear surgeries  . Crohn's disease of large intestine with abscess ([place])   . Dental crowns present    recent root canal 11/2014  . Depression   . Family history of adverse reaction to anesthesia    pt's sister has hx. of post-op N/V  . GERD (gastroesophageal reflux disease)   . Indigestion   . PONV (postoperative nausea and vomiting)   . Runny nose 12/07/2014   clear drainage, per pt.  . Seasonal allergies   . Ulcerative colitis   .  Vitamin B12 deficiency     Patient Active Problem List   Diagnosis Date Noted  . S/P laparoscopy 03/23/2017  . Chronic diastolic CHF (congestive heart failure) ([place]) 08/05/2016  . Atrial fibrillation with RVR ([place]) 06/24/2016  . Chest discomfort 06/21/2016  . Nausea and vomiting   . Small bowel obstruction ([place])   . Ileus ([place])   . Generalized anxiety disorder 02/14/2016  . 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 Hypokalemia   . Fever   . Other depression due to general medical condition   . Difficulty in walking, not elsewhere classified   . Acute blood loss anemia   . Absolute anemia   . Left upper quadrant pain   . Abdominal pain 01/07/2016  . GI bleed 01/07/2016  . IBS (irritable bowel syndrome)   . [place]   . Ulcerative pancolitis with rectal bleeding ([place])   . Diarrhea 12/24/2015  . Crohn disease ([place]) 12/24/2015  . Dehydration   . Ulcerative pancolitis with abscess ([place])   . Rectal pain   . Leucocytosis   . Cancer of lower-inner quadrant of left female breast ([place]) 11/30/2014  . Perirectal abscess 09/29/2013  . Depression 08/12/2006    Past Surgical History:  Procedure Laterality Date  . ABDOMINAL HYSTERECTOMY     partial  . APPENDECTOMY    . BASAL CELL CARCINOMA EXCISION Left    nose  . BREAST  ENHANCEMENT SURGERY    . BREAST LUMPECTOMY Left    age 66's  . BREAST LUMPECTOMY WITH RADIOACTIVE SEED AND SENTINEL LYMPH NODE BIOPSY Left 12/13/2014   Procedure: LEFT BREAST LUMPECTOMY WITH RADIOACTIVE SEED AND SENTINEL LYMPH NODE BIOPSY;  Surgeon: [name], MD;  Location: [facility];  Service: General;  Laterality: Left;  . COLONOSCOPY WITH PROPOFOL  05/28/2011; 06/28/2014  . FLEXIBLE SIGMOIDOSCOPY N/A 01/08/2016   Procedure: FLEXIBLE SIGMOIDOSCOPY;  Surgeon: [name], MD;  Location: [facility];  Service: Gastroenterology;  Laterality: N/A;  needs MAC due to pain and anxiety  . INCISION AND DRAINAGE ABSCESS  2018  . INCISION AND DRAINAGE  PERIRECTAL ABSCESS N/A 09/28/2013   Procedure: IRRIGATION AND DEBRIDEMENT PERIANAL ABSCESS, proctoscopy;  Surgeon: Shann Medal, MD;  Location: [facility];  Service: General;  Laterality: N/A;  . INCISION AND DRAINAGE PERIRECTAL ABSCESS N/A 01/22/2016   Procedure: IRRIGATION AND DEBRIDEMENT PERIRECTAL ABSCESS;  Surgeon: Armandina Gemma, MD;  Location: [facility];  Service: General;  Laterality: N/A;  . INNER EAR SURGERY Bilateral   . LAPAROSCOPIC DIVERTED COLOSTOMY N/A 02/11/2016   Procedure: LAPAROSCOPIC DIVERTED ILEOSTOMY;  Surgeon: Johnathan Hausen, MD;  Location: [facility];  Service: General;  Laterality: N/A;    OB History    No data available       Home Medications    Prior to Admission medications   Medication Sig Start Date End Date Taking? Authorizing Provider  Cyanocobalamin (VITAMIN B-12 IJ) Inject 1 mL as directed every 30 (thirty) days.   Yes [provider]  famotidine (PEPCID) 40 MG tablet Take 1 tablet (40 mg total) by mouth 2 (two) times daily. Patient taking differently: Take 40 mg by mouth daily as needed for heartburn or indigestion.  02/29/16  Yes Meuth, Brooke A, PA-C  FLUoxetine (PROZAC) 20 MG capsule TAKE 1 CAPSULE (20 MG TOTAL) BY MOUTH 2 (TWO) TIMES DAILY. 12/18/16  Yes Pyrtle, Lajuan Lines, MD  ondansetron (ZOFRAN ODT) 4 MG disintegrating tablet Take 1 tablet (4 mg total) by mouth every 6 (six) hours as needed for nausea or vomiting. 03/17/17  Yes Pyrtle, Lajuan Lines, MD  predniSONE (DELTASONE) 5 MG tablet Take 5 mg by mouth daily.  06/05/16  Yes [provider]  zolpidem (AMBIEN) 10 MG tablet TAKE 1/2 TO 1 TABLET BY MOUTH EVERY NIGHT AS NEEDED FOR SLEEP 02/04/17  Yes Pyrtle, Lajuan Lines, MD  Certolizumab Pegol (CIMZIA PREFILLED) 2 X 200 MG/ML KIT Inject 400 mg every 28 (twenty-eight) days into the skin. Patient not taking: Reported on 03/23/2017 03/10/17   Pyrtle, Lajuan Lines, MD  Certolizumab Pegol (CIMZIA STARTER KIT) 6 X 200 MG/ML KIT Inject 416m SQ day zero Inject 4068mSQ in 2 weeks  Inject 40074mQ in 4 weeks Patient not taking: Reported on 03/23/2017 03/10/17   PyrJerene BearsD  simvastatin (ZOCOR) 20 MG tablet Take 1 tablet (20 mg total) by mouth daily. Patient not taking: Reported on 03/23/2017 01/15/17   LowAnn HeldO    Family History Family History  Problem Relation Age of Onset  . Prostate cancer Brother 50 24 Kidney disease Brother   . Diabetes Brother   . Brain cancer Father   . Rheum arthritis Mother   . Anesthesia problems Sister        post-op N/V  . Emphysema Maternal Grandfather   . Asthma Sister   . Rheum arthritis Brother   . Colon cancer Neg Hx   . Esophageal  cancer Neg Hx   . Breast cancer Neg Hx     Social History Social History   Tobacco Use  . Smoking status: Never Smoker  . Smokeless tobacco: Never Used  Substance Use Topics  . Alcohol use: Yes    Alcohol/week: 8.4 oz    Types: 14 Glasses of wine per week  . Drug use: No     Allergies   Codeine; Mesalamine; Other; Oxycodone-acetaminophen; and Sulfa antibiotics   Review of Systems Review of Systems  Constitutional: Positive for fatigue.  Gastrointestinal: Positive for abdominal pain and nausea.  Neurological: Positive for weakness.  All other systems reviewed and are negative.    Physical Exam Updated Vital Signs BP 138/80   Pulse 92   Temp 98 F (36.7 C) (Oral)   Resp 17   Ht _0  (1.676 m)   Wt 69.4 kg (153 lb)   SpO2 98%   BMI 24.69 kg/m   Physical Exam  Constitutional: She is oriented to person, place, and time. She appears well-developed and well-nourished. No distress.  HENT:  Head: Normocephalic and atraumatic.  Eyes: Conjunctivae are normal.  Neck: Neck supple.  Cardiovascular: Normal rate, regular rhythm and normal heart sounds. Exam reveals no friction rub.  No murmur heard. Pulmonary/Chest: Effort normal and breath sounds normal. No respiratory distress. She has no wheezes. She has no rales.  Abdominal: Soft. Bowel sounds are  normal. She exhibits distension. There is tenderness (diffuse abd TTP). There is rebound.  RLQ ostomy in place  Musculoskeletal: She exhibits no edema.  Neurological: She is alert and oriented to person, place, and time. She exhibits normal muscle tone.  Skin: Skin is warm. Capillary refill takes less than 2 seconds.  Psychiatric: She has a normal mood and affect.  Nursing note and vitals reviewed.    ED Treatments / Results  Labs (all labs ordered are listed, but only abnormal results are displayed) Labs Reviewed  CBC WITH DIFFERENTIAL/PLATELET - Abnormal; Notable for the following components:      Result Value   WBC 14.6 (*)    Neutro Abs 11.7 (*)    All other components within normal limits  COMPREHENSIVE METABOLIC PANEL - Abnormal; Notable for the following components:   Calcium 8.7 (*)    All other components within normal limits  I-STAT CHEM 8, ED - Abnormal; Notable for the following components:   BUN 5 (*)    Calcium, Ion 1.06 (*)    TCO2 21 (*)    Hemoglobin 15.6 (*)    All other components within normal limits  CULTURE, BLOOD (ROUTINE X 2)  CULTURE, BLOOD (ROUTINE X 2)  PROTIME-INR  I-STAT CG4 LACTIC ACID, ED  I-STAT CG4 LACTIC ACID, ED  TYPE AND SCREEN    EKG  EKG Interpretation None       Radiology Ct Abdomen Pelvis Wo Contrast  Result Date: 03/23/2017 CLINICAL DATA:  Bowel perforation. EXAM: CT ABDOMEN AND PELVIS WITHOUT CONTRAST TECHNIQUE: Multidetector CT imaging of the abdomen and pelvis was performed following the standard protocol without IV contrast. COMPARISON:  Radiographs of same day.  CT scan of July 16, 2016. FINDINGS: Lower chest: No acute abnormality. Hepatobiliary: No focal liver abnormality is seen. No gallstones, gallbladder wall thickening, or biliary dilatation. Pancreas: Unremarkable. No pancreatic ductal dilatation or surrounding inflammatory changes. Spleen: Normal in size without focal abnormality. Adrenals/Urinary Tract: Adrenal  glands are unremarkable. Kidneys are normal, without renal calculi, focal lesion, or hydronephrosis. Bladder is unremarkable. Stomach/Bowel: The stomach is  unremarkable. There is no evidence of bowel dilatation. Status post appendectomy. There is a large amount of pneumoperitoneum surrounding the cecum, which tracks along the right pericolic gutter and into the epigastric region. It is also seen to extend into the mediastinum slightly. This is consistent with rupture of hollow viscus. Vascular/Lymphatic: Aortic atherosclerosis. No enlarged abdominal or pelvic lymph nodes. Reproductive: Status post hysterectomy. No adnexal masses. Other: No abnormal fluid collection or hernia is noted. Musculoskeletal: No acute or significant osseous findings. IMPRESSION: Large amount of pneumoperitoneum is noted around the cecum which extends into right pericolic gutter and epigastric region, consistent with rupture of hollow viscus. Critical Value/emergent results were called by telephone at the time of interpretation on 03/23/2017 at 9:30 p.m. to Dr. Jackolyn Confer , who verbally acknowledged these results. Electronically Signed   By: Marijo Conception, M.D.   On: 03/23/2017 21:46   Dg Abdomen Acute W/chest  Result Date: 03/23/2017 CLINICAL DATA:  Abdominal pain status post colonoscopy. EXAM: DG ABDOMEN ACUTE W/ 1V CHEST COMPARISON:  Radiographs of October 30, 2016. FINDINGS: No abnormal bowel gas pattern is noted. Pneumoperitoneum is noted concerning for rupture of hollow viscus. No radiopaque calculi or other significant radiographic abnormality is seen. Heart size and mediastinal contours are within normal limits. Both lungs are clear. IMPRESSION: Pneumoperitoneum is noted on decubitus view concerning for rupture of hollow viscus. No acute cardiopulmonary disease. Critical Value/emergent results were called by telephone at the time of interpretation on 03/23/2017 at 7:13 pm to Dr. Duffy Bruce , who verbally acknowledged  these results. Electronically Signed   By: Marijo Conception, M.D.   On: 03/23/2017 19:14    Procedures .Critical Care Performed by: Duffy Bruce, MD Authorized by: Duffy Bruce, MD   Critical care provider statement:    Critical care time (minutes):  35   Critical care time was exclusive of:  Separately billable procedures and treating other patients and teaching time   Critical care was time spent personally by me on the following activities:  Development of treatment plan with patient or surrogate, discussions with consultants, evaluation of patient's response to treatment, examination of patient, obtaining history from patient or surrogate, ordering and performing treatments and interventions, ordering and review of laboratory studies, ordering and review of radiographic studies, pulse oximetry, re-evaluation of patient's condition and review of old charts   I assumed direction of critical care for this patient from another provider in my specialty: no      (including critical care time)   Emergency Ultrasound Study:   Angiocath insertion Performed by: Evonnie Pat Consent: Verbal consent/emergent consent obtained. Risks and benefits: risks, benefits and alternatives were discussed Immediately prior to procedure the correct patient, procedure, equipment, support staff and site/side marked as needed.  Indication: difficult IV access Preparation: Patient was prepped and draped in the usual sterile fashion. Sterile gel was used for this procedure and the ultrasound probe was sterilized prior to use. Vein Location: Right forearm vein was visualized during assessment for potential access sites and was found to be patent/ easily compressed with linear ultrasound.  The needle was visualized with real-time ultrasound and guided into the vein. Gauge: 20  Image saved and stored.  Normal blood return.   Patient tolerance: Patient tolerated the procedure well with no immediate  complications.       Medications Ordered in ED Medications  iopamidol (ISOVUE-300) 61 % injection (not administered)  vancomycin (VANCOCIN) 1,500 mg in sodium chloride 0.9 % 500 mL IVPB (  Intravenous Canceled Entry 03/23/17 2200)  sodium chloride 0.9 % bolus 1,000 mL (0 mLs Intravenous Stopped 03/23/17 2116)  0.9 %  sodium chloride infusion ( Intravenous New Bag/Given 03/23/17 2121)  fentaNYL (SUBLIMAZE) injection 25 mcg (25 mcg Intravenous Given 03/23/17 2015)  ondansetron (ZOFRAN) injection 4 mg (4 mg Intravenous Given 03/23/17 2013)  piperacillin-tazobactam (ZOSYN) IVPB 3.375 g (0 g Intravenous Stopped 03/23/17 2115)     Initial Impression / Assessment and Plan / ED Course  I have reviewed the triage vital signs and the nursing notes.  Pertinent labs & imaging results that were available during my care of the patient were reviewed by me and considered in my medical decision making (see chart for details).    76 year old female here with rectal bleeding and abdominal pain after recent colonoscopy.  Patient is hemodynamically stable.  However, the patient has diffuse tenderness and guarding on my examination.  Given the significant friability on her colonoscopy, concern for possible perforation.  Will send for acute abdominal series, send labs, and reassess.  Acute abdominal series is concerning for free air.  Patient labs initially delayed due to difficult IV stick.  I was eventually notified and placed a 20-gauge in the right forearm.  Labs have been sent.  Broad-spectrum antibiotics given as well as IV fluids.  Patient is n.p.o.  I discussed with Dr. Zella Richer, who will evaluate the patient and take to the OR.  Will hold on CT, as patient will need laparoscopy for further assessment.  Surgery has evaluated.  Patient underwent CT scan which confirmed large amount of free air.  She remains hemodynamically stable but will be taken to the operating room.  I notified [facility] who is  aware.   Final Clinical Impressions(s) / ED Diagnoses   Final diagnoses:  Bowel perforation ([place])  Pneumoperitoneum      Duffy Bruce, MD 03/23/17 2250

## 2017-03-23 NOTE — ED Notes (Signed)
Cleaned pt up after an incontinence episode and replaced purewick after it was removed.

## 2017-03-23 NOTE — Progress Notes (Signed)
A/ox3 pleased with MAC, report to Karen RN 

## 2017-03-23 NOTE — ED Notes (Signed)
MD Issacs has been asked to attempt and ultrasound IV

## 2017-03-23 NOTE — ED Notes (Signed)
Patient transported to X-ray 

## 2017-03-23 NOTE — ED Notes (Signed)
Vaughan Basta- [phone] patient's sister

## 2017-03-23 NOTE — Progress Notes (Signed)
A consult was received from an ED physician for vancomycin per pharmacy dosing.  The patient's profile has been reviewed for ht/wt/allergies/indication/available labs.   A one time order has been placed for vancomycin 1514m IV x 1.  Further antibiotics/pharmacy consults should be ordered by admitting physician if indicated.                       Thank you,  DDoreene Eland PharmD, BCPS.   Pager: [phone]/26/2018 7:33 PM

## 2017-03-24 ENCOUNTER — Telehealth: Payer: Self-pay | Admitting: *Deleted

## 2017-03-24 ENCOUNTER — Encounter (HOSPITAL_COMMUNITY): Payer: Self-pay | Admitting: General Surgery

## 2017-03-24 ENCOUNTER — Other Ambulatory Visit: Payer: Self-pay

## 2017-03-24 DIAGNOSIS — K50111 Crohn's disease of large intestine with rectal bleeding: Secondary | ICD-10-CM

## 2017-03-24 DIAGNOSIS — R198 Other specified symptoms and signs involving the digestive system and abdomen: Secondary | ICD-10-CM | POA: Diagnosis present

## 2017-03-24 LAB — BASIC METABOLIC PANEL
Anion gap: 7 (ref 5–15)
BUN: 8 mg/dL (ref 6–20)
CHLORIDE: 113 mmol/L — AB (ref 101–111)
CO2: 18 mmol/L — AB (ref 22–32)
CREATININE: 0.88 mg/dL (ref 0.44–1.00)
Calcium: 8.2 mg/dL — ABNORMAL LOW (ref 8.9–10.3)
GFR calc non Af Amer: >60 mL/min (ref >60)
Glucose, Bld: 179 mg/dL — ABNORMAL HIGH (ref 65–99)
Potassium: 3.9 mmol/L (ref 3.5–5.1)
Sodium: 138 mmol/L (ref 135–145)

## 2017-03-24 LAB — CBC
HEMATOCRIT: 44.5 % (ref 36.0–46.0)
Hemoglobin: 14.9 g/dL (ref 12.0–15.0)
MCH: 33.6 pg (ref 26.0–34.0)
MCHC: 33.5 g/dL (ref 30.0–36.0)
MCV: 100.2 fL — AB (ref 78.0–100.0)
PLATELETS: 245 10*3/uL (ref 150–400)
RBC: 4.44 MIL/uL (ref 3.87–5.11)
RDW: 13.1 % (ref 11.5–15.5)
WBC: 22.1 10*3/uL — ABNORMAL HIGH (ref 4.0–10.5)

## 2017-03-24 LAB — MRSA PCR SCREENING: MRSA by PCR: NEGATIVE

## 2017-03-24 MED ORDER — ACETAMINOPHEN 10 MG/ML IV SOLN
1000.0000 mg | Freq: Four times a day (QID) | INTRAVENOUS | Status: AC
  Administered 2017-03-24 – 2017-03-25 (×4): 1000 mg via INTRAVENOUS
  Filled 2017-03-24 (×4): qty 100

## 2017-03-24 MED ORDER — FENTANYL CITRATE (PF) 100 MCG/2ML IJ SOLN
12.5000 ug | INTRAMUSCULAR | Status: DC | PRN
  Administered 2017-03-24 (×6): 25 ug via INTRAVENOUS
  Filled 2017-03-24 (×6): qty 2

## 2017-03-24 MED ORDER — HYDROMORPHONE HCL 1 MG/ML IJ SOLN
1.0000 mg | INTRAMUSCULAR | Status: DC | PRN
  Administered 2017-03-24 – 2017-03-30 (×30): 1 mg via INTRAVENOUS
  Filled 2017-03-24 (×31): qty 1

## 2017-03-24 MED ORDER — PANTOPRAZOLE SODIUM 40 MG IV SOLR
40.0000 mg | Freq: Every day | INTRAVENOUS | Status: DC
  Administered 2017-03-25 – 2017-03-26 (×2): 40 mg via INTRAVENOUS
  Filled 2017-03-24 (×3): qty 40

## 2017-03-24 MED ORDER — ONDANSETRON HCL 4 MG/2ML IJ SOLN
INTRAMUSCULAR | Status: DC | PRN
  Administered 2017-03-24: 4 mg via INTRAVENOUS

## 2017-03-24 MED ORDER — PIPERACILLIN-TAZOBACTAM 3.375 G IVPB
3.3750 g | Freq: Three times a day (TID) | INTRAVENOUS | Status: AC
  Administered 2017-03-24 – 2017-03-30 (×21): 3.375 g via INTRAVENOUS
  Filled 2017-03-24 (×20): qty 50

## 2017-03-24 MED ORDER — ONDANSETRON HCL 4 MG PO TABS: 4.0000 mg | ORAL_TABLET | Freq: Four times a day (QID) | ORAL | Status: DC | PRN

## 2017-03-24 MED ORDER — METHOCARBAMOL 1000 MG/10ML IJ SOLN
500.0000 mg | Freq: Three times a day (TID) | INTRAVENOUS | Status: DC
  Administered 2017-03-24 – 2017-03-30 (×19): 500 mg via INTRAVENOUS
  Filled 2017-03-24 (×4): qty 550
  Filled 2017-03-24: qty 5
  Filled 2017-03-24 (×5): qty 550
  Filled 2017-03-24: qty 5
  Filled 2017-03-24 (×2): qty 550
  Filled 2017-03-24: qty 5
  Filled 2017-03-24 (×8): qty 550

## 2017-03-24 MED ORDER — FENTANYL CITRATE (PF) 250 MCG/5ML IJ SOLN
INTRAMUSCULAR | Status: AC
  Filled 2017-03-24: qty 5

## 2017-03-24 MED ORDER — ENOXAPARIN SODIUM 40 MG/0.4ML ~~LOC~~ SOLN
40.0000 mg | SUBCUTANEOUS | Status: DC
  Administered 2017-03-25 – 2017-03-31 (×7): 40 mg via SUBCUTANEOUS
  Filled 2017-03-24 (×7): qty 0.4

## 2017-03-24 MED ORDER — KCL IN DEXTROSE-NACL 20-5-0.9 MEQ/L-%-% IV SOLN
INTRAVENOUS | Status: DC
  Administered 2017-03-24 – 2017-03-25 (×3): via INTRAVENOUS
  Filled 2017-03-24 (×3): qty 1000

## 2017-03-24 MED ORDER — PANTOPRAZOLE SODIUM 40 MG IV SOLR: 40.0000 mg | INTRAVENOUS | Status: DC

## 2017-03-24 MED ORDER — MENTHOL 3 MG MT LOZG
1.0000 | LOZENGE | OROMUCOSAL | Status: DC | PRN
  Administered 2017-03-24: 3 mg via ORAL
  Filled 2017-03-24: qty 9

## 2017-03-24 MED ORDER — FENTANYL CITRATE (PF) 100 MCG/2ML IJ SOLN
INTRAMUSCULAR | Status: AC
  Filled 2017-03-24: qty 2

## 2017-03-24 MED ORDER — HYDROMORPHONE HCL 1 MG/ML IJ SOLN
INTRAMUSCULAR | Status: AC
  Filled 2017-03-24: qty 1

## 2017-03-24 MED ORDER — SUGAMMADEX SODIUM 200 MG/2ML IV SOLN
INTRAVENOUS | Status: DC | PRN
  Administered 2017-03-24: 140 mg via INTRAVENOUS

## 2017-03-24 MED ORDER — METHYLPREDNISOLONE SODIUM SUCC 40 MG IJ SOLR
5.0000 mg | Freq: Two times a day (BID) | INTRAMUSCULAR | Status: DC
  Administered 2017-03-24 – 2017-03-25 (×3): 5.2 mg via INTRAVENOUS
  Filled 2017-03-24 (×5): qty 1

## 2017-03-24 MED ORDER — HYDROMORPHONE HCL 1 MG/ML IJ SOLN
0.5000 mg | INTRAMUSCULAR | Status: DC | PRN
  Administered 2017-03-24 (×2): 1 mg via INTRAVENOUS
  Filled 2017-03-24 (×2): qty 1

## 2017-03-24 MED ORDER — LACTATED RINGERS IV SOLN
INTRAVENOUS | Status: DC
  Administered 2017-03-24: 01:00:00 via INTRAVENOUS

## 2017-03-24 MED ORDER — ONDANSETRON HCL 4 MG/2ML IJ SOLN
4.0000 mg | Freq: Four times a day (QID) | INTRAMUSCULAR | Status: DC | PRN
  Filled 2017-03-24 (×2): qty 2

## 2017-03-24 MED ORDER — METHYLPREDNISOLONE SODIUM SUCC 40 MG IJ SOLR: 5.0000 mg | Freq: Two times a day (BID) | INTRAMUSCULAR | Status: DC

## 2017-03-24 MED ORDER — FLUOXETINE HCL 20 MG PO CAPS
20.0000 mg | ORAL_CAPSULE | Freq: Every day | ORAL | Status: DC
  Administered 2017-03-24 – 2017-03-31 (×8): 20 mg via ORAL
  Filled 2017-03-24 (×8): qty 1

## 2017-03-24 MED ORDER — HYDROMORPHONE HCL 1 MG/ML IJ SOLN
0.2500 mg | INTRAMUSCULAR | Status: DC | PRN
  Administered 2017-03-24: 0.25 mg via INTRAVENOUS
  Administered 2017-03-24: 0.5 mg via INTRAVENOUS
  Administered 2017-03-24: 0.25 mg via INTRAVENOUS
  Administered 2017-03-24 (×2): 0.5 mg via INTRAVENOUS

## 2017-03-24 MED ORDER — ONDANSETRON HCL 4 MG/2ML IJ SOLN
4.0000 mg | INTRAMUSCULAR | Status: DC | PRN
  Administered 2017-03-24 – 2017-03-29 (×7): 4 mg via INTRAVENOUS
  Filled 2017-03-24 (×5): qty 2

## 2017-03-24 MED ORDER — PROPOFOL 10 MG/ML IV BOLUS
INTRAVENOUS | Status: AC
  Filled 2017-03-24: qty 20

## 2017-03-24 NOTE — Progress Notes (Signed)
[facility] Progress Note Patient Name: Sara Barnes DOB: 01-30-1941 MRN: [id number]   Date of Service  03/24/2017  HPI/Events of Note  New admit with perforated bowel. Taken for laparoscopic right hemicolectomy by surgery. Patient extubated postoperatively. Camera shows patient resting/sleeping comfortably.   eICU Interventions  Continuing plan of care per surgery.      Intervention Category Evaluation Type: New Patient Evaluation  Tera Partridge 03/24/2017, 2:26 AM

## 2017-03-24 NOTE — Care Management Note (Signed)
Case Management Note  Patient Details  Name: CHAREE TUMBLIN MRN: [id number] Date of Birth: 1940/08/21  Subjective/Objective:                  Perforated cecum/rt colon s/p emergency rt colectomy severe Crohn's disease iv solu-medrol.  Action/Plan: Date: March 24, 2017 [name], BSN, [place], [place] Chart and notes review for patient progress and needs. Will follow for case management and discharge needs. Next review date: [id number]  Expected Discharge Date:  (unknown)               Expected Discharge Plan:  Home/Self Care  In-House Referral:     Discharge planning Services  CM Consult  Post Acute Care Choice:    Choice offered to:     DME Arranged:    DME Agency:     HH Arranged:    HH Agency:     Status of Service:  In process, will continue to follow  If discussed at Long Length of Stay Meetings, dates discussed:    Additional Comments:  Leeroy Cha, RN 03/24/2017, 8:14 AM

## 2017-03-24 NOTE — Transfer of Care (Signed)
Immediate Anesthesia Transfer of Care Note  Patient: Sara Barnes  Procedure(s) Performed: Procedure(s): LAPAROSCOPY DIAGNOSTIC, EXPLORATORY LAPAROTOMY,  BOWEL RESECTION (N/A)  Patient Location: PACU  Anesthesia Type:General  Level of Consciousness:  sedated, patient cooperative and responds to stimulation  Airway & Oxygen Therapy:Patient Spontanous Breathing and Patient connected to face mask oxgen  Post-op Assessment:  Report given to PACU RN and Post -op Vital signs reviewed and stable  Post vital signs:  Reviewed and stable  Last Vitals:  Vitals:   03/23/17 2012 03/23/17 2030  BP: 138/70 138/80  Pulse: 79 92  Resp: 15 17  Temp:    SpO2: 00% 51%    Complications: No apparent anesthesia complications

## 2017-03-24 NOTE — Consult Note (Signed)
Referring Provider: [facility]   Primary Care Physician:  Carollee Herter, Alferd Apa, DO Primary Gastroenterologist:  Zenovia Jarred,  MD  Reason for Consultation: Crohn's disease.     Attending physician's note   I have taken a history, examined the patient and reviewed the chart. I agree with the Advanced Practitioner's note, impression and recommendations.  Complicated Crohn's disease with colitis, perianal fistula and end ileostomy. She underwent colonoscopy yesterday showing marked left sided colitis, rectal fistula, anal stricture and procedure was discontinued in left colon. She was admitted yesterday with a cecal perforation. She is S/P exploratory laparotomy and right hemicolectomy last night.  Continue low dose corticosteroids for now Continue IV antibiotics. Plan to start Cimzia after recovery from surgery - will discuss mgmt plans, timing with Dr. Hilarie Fredrickson.    Lucio Edward, MD [name] [phone] Mon-Fri 8a-5p [phone] after 5p, weekends, holidays   ASSESSMENT AND PLAN:  76 yo female with severe Crohn's colitis, perianal fistula and end ileostomy.  Colonoscopy attempted yesterday but aborted due to severe inflammation in left colon. Admitted post procedure with bowel perforation and is s/p exp laparotomy and right hemicolectomy for cecal perforation.  -on low IV steroids, IV antibiotics -Management is surgical right now.  -Developed antibodies to Humira. We are trying to get Cimzia to replace Humira (office working on it)  HPI: Sara Barnes is a 76 y.o. female well known to Dr. Hilarie Fredrickson. She has Crohn's colitis, perianal fistula and end ileostomy.  She is followed by Dr. Sheryn Bison (colorectal surgeon) at [facility] she started Humira in June of this year.  I saw her in the office as a walk-in on 02/26/2017.  At that time she had been passing some red blood from her rectum.  Her ostomy output was at baseline.  She complained of dysuria. She complained of RLQ pain which started a week prior.  CT scan was recommended but patient did not want to have one done.  Multiple labs were drawn including Humira antibodies and drug level.  Hgb was stable despite bleeding. CRP was elevated as expected. U/A was suspicious, she was asked to come back to give specimen for culture but didn't feel like it. She was found to have Adalimumab antibodies, plan was to discontinue Humira and initiate Cimzia which our office is trying to help her get. In the interim she was scheduled for a colonoscopy which she underwent yesterday.   Colonoscopy yesterday : Findings included external hemorrhoids, anal stricture (able to be traversed by pediatric scope), adherent clot, edema/erosions/erythema/ friability with bleeding/ andloss of vascularity found in large patches surrounded by normal mucosa in the rectum.  A fistula was found in the distal rectum near the dentate line.  Retroflexion was not done because of the proctitis and narrowed rectal vault.  Due to the bleeding and severity of inflammation the procedure was aborted before reaching the right colon.   Post colonoscopy patient felt too poorly to go home. No rooms at hospital were available for direct admission so she was sent to the emergency department.  In the ED patient was diffusely tender with guarding.  Acute abdominal series done and showed free air.  Surgery was consulted, CT scan confirmed a large amount of free air.  She was taken to the OR around midnight and underwent a exploratory laparotomy with findings of what appeared to be a cecal perforation.  She underwent right colectomy  Past Medical History:  Diagnosis Date  . Allergy   . Anemia   .  Basal cell carcinoma   . Blood transfusion without reported diagnosis   . Cancer of lower-inner quadrant of female breast ([place]) 11/30/2014   left  . Cancer of lower-inner quadrant of left female breast ([place]) 11/30/2014  . Complication of anesthesia    anxiety post-op after ear surgeries  . Crohn's disease of  large intestine with abscess ([place])   . Dental crowns present    recent root canal 11/2014  . Depression   . Family history of adverse reaction to anesthesia    pt's sister has hx. of post-op N/V  . GERD (gastroesophageal reflux disease)   . Indigestion   . PONV (postoperative nausea and vomiting)   . Runny nose 12/07/2014   clear drainage, per pt.  . Seasonal allergies   . Ulcerative colitis   . Vitamin B12 deficiency     Past Surgical History:  Procedure Laterality Date  . ABDOMINAL HYSTERECTOMY     partial  . APPENDECTOMY    . BASAL CELL CARCINOMA EXCISION Left    nose  . BREAST ENHANCEMENT SURGERY    . BREAST LUMPECTOMY Left    age 72's  . BREAST LUMPECTOMY WITH RADIOACTIVE SEED AND SENTINEL LYMPH NODE BIOPSY Left 12/13/2014   Procedure: LEFT BREAST LUMPECTOMY WITH RADIOACTIVE SEED AND SENTINEL LYMPH NODE BIOPSY;  Surgeon:  Klein, MD;  Location: [facility];  Service: General;  Laterality: Left;  . COLONOSCOPY WITH PROPOFOL  05/28/2011; 06/28/2014  . FLEXIBLE SIGMOIDOSCOPY N/A 01/08/2016   Procedure: FLEXIBLE SIGMOIDOSCOPY;  Surgeon: [name], MD;  Location: [facility];  Service: Gastroenterology;  Laterality: N/A;  needs MAC due to pain and anxiety  . INCISION AND DRAINAGE ABSCESS  2018  . INCISION AND DRAINAGE PERIRECTAL ABSCESS N/A 09/28/2013   Procedure: IRRIGATION AND DEBRIDEMENT PERIANAL ABSCESS, proctoscopy;  Surgeon: Shann Medal, MD;  Location: [facility];  Service: General;  Laterality: N/A;  . INCISION AND DRAINAGE PERIRECTAL ABSCESS N/A 01/22/2016   Procedure: IRRIGATION AND DEBRIDEMENT PERIRECTAL ABSCESS;  Surgeon: Armandina Gemma, MD;  Location: [facility];  Service: General;  Laterality: N/A;  . INNER EAR SURGERY Bilateral   . LAPAROSCOPIC DIVERTED COLOSTOMY N/A 02/11/2016   Procedure: LAPAROSCOPIC DIVERTED ILEOSTOMY;  Surgeon: Johnathan Hausen, MD;  Location: [facility];  Service: General;  Laterality: N/A;  . LAPAROSCOPY N/A 03/23/2017    Procedure: LAPAROSCOPY DIAGNOSTIC, EXPLORATORY LAPAROTOMY,  BOWEL RESECTION;  Surgeon: Jackolyn Confer, MD;  Location: [facility];  Service: General;  Laterality: N/A;    Prior to Admission medications   Medication Sig Start Date End Date Taking? Authorizing Provider  Cyanocobalamin (VITAMIN B-12 IJ) Inject 1 mL as directed every 30 (thirty) days.   Yes [provider]  famotidine (PEPCID) 40 MG tablet Take 1 tablet (40 mg total) by mouth 2 (two) times daily. Patient taking differently: Take 40 mg by mouth daily as needed for heartburn or indigestion.  02/29/16  Yes Meuth, Brooke A, PA-C  FLUoxetine (PROZAC) 20 MG capsule TAKE 1 CAPSULE (20 MG TOTAL) BY MOUTH 2 (TWO) TIMES DAILY. 12/18/16  Yes Pyrtle, Lajuan Lines, MD  ondansetron (ZOFRAN ODT) 4 MG disintegrating tablet Take 1 tablet (4 mg total) by mouth every 6 (six) hours as needed for nausea or vomiting. 03/17/17  Yes Pyrtle, Lajuan Lines, MD  predniSONE (DELTASONE) 5 MG tablet Take 5 mg by mouth daily.  06/05/16  Yes [provider]  zolpidem (AMBIEN) 10 MG tablet TAKE 1/2 TO 1 TABLET BY MOUTH EVERY NIGHT AS NEEDED FOR SLEEP 02/04/17  Yes Pyrtle, Lajuan Lines, MD  Certolizumab Pegol (CIMZIA PREFILLED) 2 X 200 MG/ML KIT Inject 400 mg every 28 (twenty-eight) days into the skin. Patient not taking: Reported on 03/23/2017 03/10/17   Pyrtle, Lajuan Lines, MD  Certolizumab Pegol (CIMZIA STARTER KIT) 6 X 200 MG/ML KIT Inject 425m SQ day zero Inject 4083mSQ in 2 weeks Inject 40054mQ in 4 weeks Patient not taking: Reported on 03/23/2017 03/10/17   PyrJerene BearsD  simvastatin (ZOCOR) 20 MG tablet Take 1 tablet (20 mg total) by mouth daily. Patient not taking: Reported on 03/23/2017 01/15/17   LowAnn HeldO    Current Facility-Administered Medications  Medication Dose Route Frequency Provider Last Rate Last Dose  . acetaminophen (OFIRMEV) IV 1,000 mg  1,000 mg Intravenous Q6H JenEarnstine RegalA-C 400 mL/hr at 03/24/17 1107 1,000 mg at 03/24/17  1107  . dextrose 5 % and 0.9 % NaCl with KCl 20 mEq/L infusion   Intravenous Continuous RosJackolyn ConferD 100 mL/hr at 03/24/17 0240    . [START ON 03/25/2017] enoxaparin (LOVENOX) injection 40 mg  40 mg Subcutaneous Q24H Rosenbower, Todd, MD      . fentaNYL (SUBLIMAZE) injection 12.5-25 mcg  12.5-25 mcg Intravenous Q1H PRN RosJackolyn ConferD   25 mcg at 03/24/17 1114  . HYDROmorphone (DILAUDID) 1 MG/ML injection           . HYDROmorphone (DILAUDID) 1 MG/ML injection           . HYDROmorphone (DILAUDID) injection 0.25-0.5 mg  0.25-0.5 mg Intravenous Q5 min PRN HodAlbertha GheeD   0.5 mg at 03/24/17 0128  . menthol-cetylpyridinium (CEPACOL) lozenge 3 mg  1 lozenge Oral PRN RosJackolyn ConferD      . methocarbamol (ROBAXIN) 500 mg in dextrose 5 % 50 mL IVPB  500 mg Intravenous Q8H RosJackolyn ConferD   Stopped at 03/24/17 [id number] methylPREDNISolone sodium succinate (SOLU-MEDROL) 40 mg/mL injection 5.2 mg  5.2 mg Intravenous BID RosJackolyn ConferD   5.2 mg at 03/24/17 0251  . ondansetron (ZOFRAN) injection 4 mg  4 mg Intravenous Q6H PRN Hodierne, Adam, MD      . ondansetron (ZOFRAN) tablet 4 mg  4 mg Oral Q6H PRN RosJackolyn ConferD       Or  . ondansetron (ZOFRAN) injection 4 mg  4 mg Intravenous Q4H PRN Rosenbower, TodSherren MochaD      . pantoprazole (PROTONIX) injection 40 mg  40 mg Intravenous QHS Rosenbower, TodSherren MochaD      . piperacillin-tazobactam (ZOSYN) IVPB 3.375 g  3.375 g Intravenous Q8H Rosenbower, TodSherren MochaD 12.5 mL/hr at 03/24/17 1114 3.375 g at 03/24/17 1114    Allergies as of 03/23/2017 - Review Complete 03/23/2017  Allergen Reaction Noted  . Codeine Nausea And Vomiting 01/20/2008  . Mesalamine Nausea And Vomiting 05/13/2011  . Other Itching 12/07/2014  . Oxycodone-acetaminophen Rash 01/04/2015  . Sulfa antibiotics Rash 12/07/2014    Family History  Problem Relation Age of Onset  . Prostate cancer Brother 50 36 Kidney disease Brother   . Diabetes Brother   . Brain cancer  Father   . Rheum arthritis Mother   . Anesthesia problems Sister        post-op N/V  . Emphysema Maternal Grandfather   . Asthma Sister   . Rheum arthritis Brother   . Colon cancer Neg Hx   . Esophageal cancer Neg Hx   . Breast cancer Neg Hx     Social  History   Socioeconomic History  . Marital status: Widowed    Spouse name: Not on file  . Number of children: 1  . Years of education: Not on file  . Highest education level: Not on file  Social Needs  . Financial resource strain: Not on file  . Food insecurity - worry: Not on file  . Food insecurity - inability: Not on file  . Transportation needs - medical: Not on file  . Transportation needs - non-medical: Not on file  Occupational History  . Occupation: Warden/ranger    Comment: retired at age 34  Tobacco Use  . Smoking status: Never Smoker  . Smokeless tobacco: Never Used  Substance and Sexual Activity  . Alcohol use: Yes    Alcohol/week: 8.4 oz    Types: 14 Glasses of wine per week  . Drug use: No  . Sexual activity: Not Currently    Partners: Male  Other Topics Concern  . Not on file  Social History Narrative   No exercise due to health    Review of Systems: All systems reviewed and negative except where noted in HPI.  Physical Exam: Vital signs in last 24 hours: Temp:  [96 F (35.6 C)-98 F (36.7 C)] 98 F (36.7 C) (11/27 0800) Pulse Rate:  [68-103] 90 (11/27 0800) Resp:  [12-22] 15 (11/27 0800) BP: (129-188)/(61-94) 129/87 (11/27 0800) SpO2:  [94 %-100 %] 96 % (11/27 0800) Weight:  [153 lb (69.4 kg)-156 lb 12 oz (71.1 kg)] 156 lb 12 oz (71.1 kg) (11/27 0208)   General:   Alert, well-developed,  White female in NAD Psych:  Pleasant, cooperative. Normal mood and affect. Eyes:  Pupils equal, sclera clear, no icterus.   Conjunctiva pink. Ears:  Normal auditory acuity. Nose:  No deformity, discharge,  or lesions. Neck:  Supple; no masses Lungs:  Normal respiratory effort Heart:  Regular rate and  rhythm, no edema Abdomen:  Soft, surgical dressing, hypoactive bowel sounds.     Rectal:  Deferred  Msk:  Symmetrical without gross deformities. . Neurologic:  Alert and  oriented x4;  grossly normal neurologically. Skin:  Intact without significant lesions or rashes..   Intake/Output from previous day: 11/26 0701 - 11/27 0700 In: 3583.3 [I.V.:1633.3; IV Piggyback:1950] Out: 550 [Urine:450; Blood:100] Intake/Output this shift: No intake/output data recorded.  Lab Results: Recent Labs    03/23/17 2003 03/23/17 2011 03/24/17 0547  WBC 14.6*  --  22.1*  HGB 14.8 15.6* 14.9  HCT 43.7 46.0 44.5  PLT 230  --  245   BMET Recent Labs    03/23/17 2003 03/23/17 2011 03/24/17 0547  NA 136 139 138  K 3.5 3.6 3.9  CL 107 106 113*  CO2 22  --  18*  GLUCOSE 99 96 179*  BUN 7 5* 8  CREATININE 0.81 0.70 0.88  CALCIUM 8.7*  --  8.2*   LFT Recent Labs    03/23/17 2003  PROT 6.7  ALBUMIN 3.5  AST 23  ALT 16  ALKPHOS 85  BILITOT 1.0   PT/INR Recent Labs    03/23/17 2003  LABPROT 13.4  INR 1.03   Hepatitis Panel No results for input(s): HEPBSAG, HCVAB, HEPAIGM, HEPBIGM in the last 72 hours.   Studies/Results: Ct Abdomen Pelvis Wo Contrast  Result Date: 03/23/2017 CLINICAL DATA:  Bowel perforation. EXAM: CT ABDOMEN AND PELVIS WITHOUT CONTRAST TECHNIQUE: Multidetector CT imaging of the abdomen and pelvis was performed following the standard protocol without IV contrast. COMPARISON:  Radiographs of same day.  CT scan of July 16, 2016. FINDINGS: Lower chest: No acute abnormality. Hepatobiliary: No focal liver abnormality is seen. No gallstones, gallbladder wall thickening, or biliary dilatation. Pancreas: Unremarkable. No pancreatic ductal dilatation or surrounding inflammatory changes. Spleen: Normal in size without focal abnormality. Adrenals/Urinary Tract: Adrenal glands are unremarkable. Kidneys are normal, without renal calculi, focal lesion, or hydronephrosis. Bladder  is unremarkable. Stomach/Bowel: The stomach is unremarkable. There is no evidence of bowel dilatation. Status post appendectomy. There is a large amount of pneumoperitoneum surrounding the cecum, which tracks along the right pericolic gutter and into the epigastric region. It is also seen to extend into the mediastinum slightly. This is consistent with rupture of hollow viscus. Vascular/Lymphatic: Aortic atherosclerosis. No enlarged abdominal or pelvic lymph nodes. Reproductive: Status post hysterectomy. No adnexal masses. Other: No abnormal fluid collection or hernia is noted. Musculoskeletal: No acute or significant osseous findings. IMPRESSION: Large amount of pneumoperitoneum is noted around the cecum which extends into right pericolic gutter and epigastric region, consistent with rupture of hollow viscus. Critical Value/emergent results were called by telephone at the time of interpretation on 03/23/2017 at 9:30 p.m. to Dr. Jackolyn Confer , who verbally acknowledged these results. Electronically Signed   By: Marijo Conception, M.D.   On: 03/23/2017 21:46   Dg Abdomen Acute W/chest  Result Date: 03/23/2017 CLINICAL DATA:  Abdominal pain status post colonoscopy. EXAM: DG ABDOMEN ACUTE W/ 1V CHEST COMPARISON:  Radiographs of October 30, 2016. FINDINGS: No abnormal bowel gas pattern is noted. Pneumoperitoneum is noted concerning for rupture of hollow viscus. No radiopaque calculi or other significant radiographic abnormality is seen. Heart size and mediastinal contours are within normal limits. Both lungs are clear. IMPRESSION: Pneumoperitoneum is noted on decubitus view concerning for rupture of hollow viscus. No acute cardiopulmonary disease. Critical Value/emergent results were called by telephone at the time of interpretation on 03/23/2017 at 7:13 pm to Dr. Duffy Bruce , who verbally acknowledged these results. Electronically Signed   By: Marijo Conception, M.D.   On: 03/23/2017 19:14    Tye Savoy,  NP-C @  03/24/2017, 11:49 AM  Pager number [phone]

## 2017-03-24 NOTE — Progress Notes (Signed)
Assessment Active Problems:   Perforated cecum/right colon s/p emergency right colectomy 03/24/17-hemodynamically stable    Severe, progressive  Crohn's disease of colon and rectum with rectal fistula-on low dose IV Solumedrol  H/o Afib  Plan:  Continue IV Zosyn. Continue ng. Wait for bowel function to return.  Start dressing changes tomorrow. Repeat lab tomorrow.  OOB to chair later today.  LOS: 1 day     1 Day Post-Op  Chief Complaint/Subjective: Comfortable this AM.  Discussed the surgery with her.  She is fixated on having ileostomy reversed.   Objective: Vital signs in last 24 hours: Temp:  [96 F (35.6 C)-98 F (36.7 C)] 97.7 F (36.5 C) (11/27 0353) Pulse Rate:  [68-103] 97 (11/27 0600) Resp:  [12-22] 15 (11/27 0600) BP: (131-188)/(61-94) 135/87 (11/27 0600) SpO2:  [94 %-100 %] 96 % (11/27 0600) Weight:  [69.4 kg (153 lb)-71.1 kg (156 lb 12 oz)] 71.1 kg (156 lb 12 oz) (11/27 0208)    Intake/Output from previous day: 11/26 0701 - 11/27 0700 In: 3583.3 [I.V.:1633.3; IV Piggyback:1950] Out: 550 [Urine:450; Blood:100] Intake/Output this shift: No intake/output data recorded.  PE: General- In NAD.  Awake and alert. Abdomen-firm, distended, dressing dry  Lab Results:  Recent Labs    03/23/17 2003 03/23/17 2011 03/24/17 0547  WBC 14.6*  --  22.1*  HGB 14.8 15.6* 14.9  HCT 43.7 46.0 44.5  PLT 230  --  245   BMET Recent Labs    03/23/17 2003 03/23/17 2011 03/24/17 0547  NA 136 139 138  K 3.5 3.6 3.9  CL 107 106 113*  CO2 22  --  18*  GLUCOSE 99 96 179*  BUN 7 5* 8  CREATININE 0.81 0.70 0.88  CALCIUM 8.7*  --  8.2*   PT/INR Recent Labs    03/23/17 2003  LABPROT 13.4  INR 1.03   Comprehensive Metabolic Panel:    Component Value Date/Time   NA 138 03/24/2017 0547   NA 139 03/23/2017 2011   NA 138 12/06/2014 0804   K 3.9 03/24/2017 0547   K 3.6 03/23/2017 2011   K 4.1 12/06/2014 0804   CL 113 (H) 03/24/2017 0547   CL 106 03/23/2017 2011   CO2 18 (L) 03/24/2017 0547   CO2 22 03/23/2017 2003   CO2 25 12/06/2014 0804   BUN 8 03/24/2017 0547   BUN 5 (L) 03/23/2017 2011   BUN 12.3 12/06/2014 0804   CREATININE 0.88 03/24/2017 0547   CREATININE 0.70 03/23/2017 2011   CREATININE 0.8 12/06/2014 0804   GLUCOSE 179 (H) 03/24/2017 0547   GLUCOSE 96 03/23/2017 2011   GLUCOSE 92 12/06/2014 0804   CALCIUM 8.2 (L) 03/24/2017 0547   CALCIUM 8.7 (L) 03/23/2017 2003   CALCIUM 9.4 12/06/2014 0804   AST 23 03/23/2017 2003   AST 21 01/07/2017 0947   AST 9 12/06/2014 0804   ALT 16 03/23/2017 2003   ALT 13 01/07/2017 0947   ALT 10 12/06/2014 0804   ALKPHOS 85 03/23/2017 2003   ALKPHOS 75 01/07/2017 0947   ALKPHOS 89 12/06/2014 0804   BILITOT 1.0 03/23/2017 2003   BILITOT 0.7 01/07/2017 0947   BILITOT 0.37 12/06/2014 0804   PROT 6.7 03/23/2017 2003   PROT 6.5 01/07/2017 0947   PROT 6.5 12/06/2014 0804   ALBUMIN 3.5 03/23/2017 2003   ALBUMIN 3.7 01/07/2017 0947   ALBUMIN 3.3 (L) 12/06/2014 0804     Studies/Results: Ct Abdomen Pelvis Wo Contrast  Result Date: 03/23/2017 CLINICAL DATA:  Bowel perforation. EXAM: CT ABDOMEN AND PELVIS WITHOUT CONTRAST TECHNIQUE: Multidetector CT imaging of the abdomen and pelvis was performed following the standard protocol without IV contrast. COMPARISON:  Radiographs of same day.  CT scan of July 16, 2016. FINDINGS: Lower chest: No acute abnormality. Hepatobiliary: No focal liver abnormality is seen. No gallstones, gallbladder wall thickening, or biliary dilatation. Pancreas: Unremarkable. No pancreatic ductal dilatation or surrounding inflammatory changes. Spleen: Normal in size without focal abnormality. Adrenals/Urinary Tract: Adrenal glands are unremarkable. Kidneys are normal, without renal calculi, focal lesion, or hydronephrosis. Bladder is unremarkable. Stomach/Bowel: The stomach is unremarkable. There is no evidence of bowel dilatation. Status post appendectomy. There is a large amount of  pneumoperitoneum surrounding the cecum, which tracks along the right pericolic gutter and into the epigastric region. It is also seen to extend into the mediastinum slightly. This is consistent with rupture of hollow viscus. Vascular/Lymphatic: Aortic atherosclerosis. No enlarged abdominal or pelvic lymph nodes. Reproductive: Status post hysterectomy. No adnexal masses. Other: No abnormal fluid collection or hernia is noted. Musculoskeletal: No acute or significant osseous findings. IMPRESSION: Large amount of pneumoperitoneum is noted around the cecum which extends into right pericolic gutter and epigastric region, consistent with rupture of hollow viscus. Critical Value/emergent results were called by telephone at the time of interpretation on 03/23/2017 at 9:30 p.m. to Dr. Jackolyn Confer , who verbally acknowledged these results. Electronically Signed   By: Marijo Conception, M.D.   On: 03/23/2017 21:46   Dg Abdomen Acute W/chest  Result Date: 03/23/2017 CLINICAL DATA:  Abdominal pain status post colonoscopy. EXAM: DG ABDOMEN ACUTE W/ 1V CHEST COMPARISON:  Radiographs of October 30, 2016. FINDINGS: No abnormal bowel gas pattern is noted. Pneumoperitoneum is noted concerning for rupture of hollow viscus. No radiopaque calculi or other significant radiographic abnormality is seen. Heart size and mediastinal contours are within normal limits. Both lungs are clear. IMPRESSION: Pneumoperitoneum is noted on decubitus view concerning for rupture of hollow viscus. No acute cardiopulmonary disease. Critical Value/emergent results were called by telephone at the time of interpretation on 03/23/2017 at 7:13 pm to Dr. Duffy Bruce , who verbally acknowledged these results. Electronically Signed   By: Marijo Conception, M.D.   On: 03/23/2017 19:14    Anti-infectives: Anti-infectives (From admission, onward)   Start     Dose/Rate Route Frequency Ordered Stop   03/24/17 0230  piperacillin-tazobactam (ZOSYN) IVPB 3.375 g      3.375 g 12.5 mL/hr over 240 Minutes Intravenous Every 8 hours 03/24/17 0217     03/23/17 2030  vancomycin (VANCOCIN) 1,500 mg in sodium chloride 0.9 % 500 mL IVPB     1,500 mg 250 mL/hr over 120 Minutes Intravenous  Once 03/23/17 1932 03/23/17 2325   03/23/17 1930  piperacillin-tazobactam (ZOSYN) IVPB 3.375 g     3.375 g 100 mL/hr over 30 Minutes Intravenous  Once 03/23/17 1917 03/23/17 2115       Jackolyn Confer 03/24/2017

## 2017-03-24 NOTE — Telephone Encounter (Signed)
Pt not called d/t being in hospital

## 2017-03-24 NOTE — Op Note (Signed)
Operative Note  Sara Barnes female 76 y.o. 03/24/2017  PREOPERATIVE DX:  Perforated viscous  POSTOPERATIVE DX:  Same (cecum)  PROCEDURE:   Diagnostic laparoscopy, exploratory laparotomy, right colectomy         Surgeon: Odis Hollingshead   Anesthesia: General endotracheal anesthesia  Indications:   This is a 76 year old female with Crohn's disease on chronic steroids who underwent colonoscopy today. She is found to have severe inflammatory changes of the sigmoid colon and rectum and descending colon. Biopsies were taken of the descending colon. Because of the severe inflammation with colonoscopy was not completed to the right side. She had some bleeding after the colonoscopy. She subsequently was sent to the emergency department for admission and to start on high dose IV steroids.  However, upon arrival to the emergency department, she was noted to have diffuse abdominal tenderness with some guarding. Upon further questioning, she was having some increasing right lower quadrant abdominal pain. Plain abdominal x-rays suggested a pneumoperitoneum. CT scan demonstrated a large pneumoperitoneum with most of the free air around the cecum tracking up the right gutter around the liver and some even up into the mediastinal area. Speaking with the radiologist, even thought he might see a small defect in the anterior wall of the cecum. Her exam worsened during her time in the emergency department. She had leukocytosis. She is now brought to the operating room for emergency surgery for perforated viscus.    Procedure Detail:  She's brought to the operating room placed supine on the operating table and a general anesthetic was given. A Foley catheter was inserted. A nasogastric tube was inserted. The ileostomy appliance was removed. A 4 x 4 gauze was placed over it followed by a Tegaderm. The abdominal wall was sterilely prepped and draped. A timeout was performed.  A small subumbilical incision was  made to the skin, subcutaneous tissue, fascia, peritoneum entering the peritoneal cavity under direct vision. A pursestring suture of 0 Vicryl was placed around the edges of the fascia. A Hassan trocar was introduced into the peritoneal cavity and a pneumoperitoneum created by insufflation of CO2 gas.  The laparoscope was introduced. A 5 mm trocar was placed in the lower midline. The cecum was noted to be somewhat discolored and inflamed. There appeared to be gas around the cecal area and up in the mesenteric fat. There appeared to be a small defect with visible mucosa in the cecal area. I did not think this would heal. Thus I converted this to exploratory laparotomy.  Trochars were removed. Beginning at the 5 mm trocar site and made an incision extending superiorly and around the umbilicus up into the epigastrium. All layers were divided. The abdomen was then explored. Once I made the larger incision, a foul odor was noted. The cecum again was abnormal with what appeared to be a small defect present and some inflammatory changes that extended up part of the right colon. I inspected the sigmoid colon and descending colon. They appeared to be thickened but there were no defects or acute external inflammatory changes or suggestion of perforation. I inspected the splenic flexure which was surrounded by fatty tissue but there is no evidence of perforation here. Transverse colon demonstrated no evidence of perforation. The abnormality to explain the situation was the abnormality in the cecum. I decided to proceed with right colectomy.  Lateral attachments of the right colon and cecum were mobilized. There were some adhesions where she had the previous diverting ileostomy and a small  bit of ileum was still present. I mobilized this up off the retroperitoneum staying close to the colon. I mobilized the hepatic flexure. I chose an area in the mid transverse colon. A small defect was created around the colon and it was  divided with the GIA stapler. I then divided the mesentery close to the colon with the LigaSure device. Specimen was handed off the field. The staple line was solid without evidence of leak.  I then copiously irrigated out the abdominal cavity with warm saline solution. I inspected the abdominal cavity and there is no evidence of organ injury or bleeding.]  Estimated Blood Loss:  300 mL                Specimens: Right colon        Complications:  * No complications entered in OR log *         Disposition: PACU - hemodynamically stable.         Condition: stable

## 2017-03-25 LAB — BASIC METABOLIC PANEL
ANION GAP: 7 (ref 5–15)
BUN: 11 mg/dL (ref 6–20)
CHLORIDE: 116 mmol/L — AB (ref 101–111)
CO2: 21 mmol/L — AB (ref 22–32)
CREATININE: 1.35 mg/dL — AB (ref 0.44–1.00)
Calcium: 8.4 mg/dL — ABNORMAL LOW (ref 8.9–10.3)
GFR, EST AFRICAN AMERICAN: 43 mL/min — AB (ref >60)
GFR, EST NON AFRICAN AMERICAN: 37 mL/min — AB (ref >60)
Glucose, Bld: 135 mg/dL — ABNORMAL HIGH (ref 65–99)
POTASSIUM: 4.2 mmol/L (ref 3.5–5.1)
SODIUM: 144 mmol/L (ref 135–145)

## 2017-03-25 LAB — CBC
HEMATOCRIT: 42.4 % (ref 36.0–46.0)
HEMOGLOBIN: 13.5 g/dL (ref 12.0–15.0)
MCH: 32.8 pg (ref 26.0–34.0)
MCHC: 31.8 g/dL (ref 30.0–36.0)
MCV: 103.2 fL — ABNORMAL HIGH (ref 78.0–100.0)
Platelets: 255 10*3/uL (ref 150–400)
RBC: 4.11 MIL/uL (ref 3.87–5.11)
RDW: 13.4 % (ref 11.5–15.5)
WBC: 16.4 10*3/uL — AB (ref 4.0–10.5)

## 2017-03-25 MED ORDER — KCL IN DEXTROSE-NACL 20-5-0.9 MEQ/L-%-% IV SOLN
INTRAVENOUS | Status: DC
  Administered 2017-03-25: 14:00:00 via INTRAVENOUS
  Filled 2017-03-25 (×2): qty 1000

## 2017-03-25 MED ORDER — METHYLPREDNISOLONE SODIUM SUCC 40 MG IJ SOLR
5.0000 mg | INTRAMUSCULAR | Status: DC
  Administered 2017-03-26 – 2017-03-30 (×5): 5.2 mg via INTRAVENOUS
  Filled 2017-03-25 (×5): qty 1

## 2017-03-25 NOTE — Anesthesia Postprocedure Evaluation (Signed)
Anesthesia Post Note  Patient: Sara Barnes  Procedure(s) Performed: LAPAROSCOPY DIAGNOSTIC, EXPLORATORY LAPAROTOMY,  BOWEL RESECTION (N/A Abdomen)     Patient location during evaluation: PACU Anesthesia Type: General Level of consciousness: awake and alert Pain management: pain level controlled Vital Signs Assessment: post-procedure vital signs reviewed and stable Respiratory status: spontaneous breathing, nonlabored ventilation, respiratory function stable and patient connected to nasal cannula oxygen Cardiovascular status: blood pressure returned to baseline and stable Postop Assessment: no apparent nausea or vomiting Anesthetic complications: no    Last Vitals:  Vitals:   03/25/17 0600 03/25/17 0800  BP: (!) 154/82   Pulse: 84   Resp: 19   Temp:  36.7 C  SpO2: 99%     Last Pain:  Vitals:   03/25/17 0800  TempSrc: Oral  PainSc:    Pain Goal: Patients Stated Pain Goal: 4 (03/24/17 2000)               [place]

## 2017-03-25 NOTE — Progress Notes (Signed)
Progress Note: General Surgery Service   Assessment/Plan: Patient Active Problem List   Diagnosis Date Noted  . Perforated viscus 03/24/2017  . S/P laparoscopy 03/23/2017  . Chronic diastolic CHF (congestive heart failure) ([place]) 08/05/2016  . Atrial fibrillation with RVR ([place]) 06/24/2016  . Chest discomfort 06/21/2016  . Nausea and vomiting   . Small bowel obstruction ([place])   . Ileus ([place])   . Generalized anxiety disorder 02/14/2016  . 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 Hypokalemia   . Fever   . Other depression due to general medical condition   . Difficulty in walking, not elsewhere classified   . Acute blood loss anemia   . Absolute anemia   . Left upper quadrant pain   . Abdominal pain 01/07/2016  . GI bleed 01/07/2016  . IBS (irritable bowel syndrome)   . [place]   . Ulcerative pancolitis with rectal bleeding ([place])   . Diarrhea 12/24/2015  . Crohn disease ([place]) 12/24/2015  . Dehydration   . Ulcerative pancolitis with abscess ([place])   . Rectal pain   . Leucocytosis   . Cancer of lower-inner quadrant of left female breast ([place]) 11/30/2014  . Perirectal abscess 09/29/2013  . Depression 08/12/2006   s/p Procedure(s): LAPAROSCOPY DIAGNOSTIC, EXPLORATORY LAPAROTOMY,  BOWEL RESECTION 03/23/2017 -transfer to floor -continue NG tube -remove foley -up to chair -continue steroids per GI -continue abx    LOS: 2 days  Chief Complaint/Subjective: Some nausea overnight, pain moderately controlled  Objective: Vital signs in last 24 hours: Temp:  [97.5 F (36.4 C)-98.2 F (36.8 C)] 97.6 F (36.4 C) (11/28 0329) Pulse Rate:  [76-93] 84 (11/28 0600) Resp:  [8-25] 19 (11/28 0600) BP: (106-154)/(53-87) 154/82 (11/28 0600) SpO2:  [86 %-99 %] 99 % (11/28 0600)    Intake/Output from previous day: 11/27 0701 - 11/28 0700 In: 2350 [I.V.:1200; NG/GT:450; IV Piggyback:565] Out: 1625 [Urine:725; Emesis/NG output:900] Intake/Output this shift: No intake/output data  recorded.  Lungs: CTAB  Cardiovascular: RRR  Abd: soft, ATTP, ostomy with small amount brown output  Extremities: no edema  Neuro: alert, oriented, answers simple questions appropriately  Lab Results: CBC  Recent Labs    03/24/17 0547 03/24/17 2351  WBC 22.1* 16.4*  HGB 14.9 13.5  HCT 44.5 42.4  PLT 245 255   BMET Recent Labs    03/24/17 0547 03/25/17 0319  NA 138 144  K 3.9 4.2  CL 113* 116*  CO2 18* 21*  GLUCOSE 179* 135*  BUN 8 11  CREATININE 0.88 1.35*  CALCIUM 8.2* 8.4*   PT/INR Recent Labs    03/23/17 2003  LABPROT 13.4  INR 1.03   ABG No results for input(s): PHART, HCO3 in the last 72 hours.  Invalid input(s): PCO2, PO2  Studies/Results:  Anti-infectives: Anti-infectives (From admission, onward)   Start     Dose/Rate Route Frequency Ordered Stop   03/24/17 0230  piperacillin-tazobactam (ZOSYN) IVPB 3.375 g     3.375 g 12.5 mL/hr over 240 Minutes Intravenous Every 8 hours 03/24/17 0217     03/23/17 2030  vancomycin (VANCOCIN) 1,500 mg in sodium chloride 0.9 % 500 mL IVPB     1,500 mg 250 mL/hr over 120 Minutes Intravenous  Once 03/23/17 1932 03/23/17 2325   03/23/17 1930  piperacillin-tazobactam (ZOSYN) IVPB 3.375 g     3.375 g 100 mL/hr over 30 Minutes Intravenous  Once 03/23/17 1917 03/23/17 2115      Medications: Scheduled Meds: . enoxaparin (LOVENOX) injection  40 mg Subcutaneous Q24H  .  FLUoxetine  20 mg Oral Daily  . methylPREDNISolone (SOLU-MEDROL) injection  5.2 mg Intravenous BID  . pantoprazole (PROTONIX) IV  40 mg Intravenous QHS   Continuous Infusions: . dextrose 5 % and 0.9 % NaCl with KCl 20 mEq/L 100 mL/hr at 03/24/17 1817  . methocarbamol (ROBAXIN)  IV 500 mg (03/25/17 1937)  . piperacillin-tazobactam (ZOSYN)  IV Stopped (03/25/17 0726)   PRN Meds:.HYDROmorphone (DILAUDID) injection, menthol-cetylpyridinium, ondansetron (ZOFRAN) IV, ondansetron **OR** ondansetron (ZOFRAN) IV  Mickeal Skinner, MD Pg# [phone] [facility]

## 2017-03-25 NOTE — Progress Notes (Signed)
More awake this AM.  I discussed the operation with her.

## 2017-03-25 NOTE — Consult Note (Signed)
[place] Nurse ostomy consult note Stoma type/location: RLQ colostomy Stomal assessment/size: 7/8 inch ostomy with budded appearance, red, moist. Os at 12 o'clock. Peristomal assessment: intact, clear Treatment options for stomal/peristomal skin:  Output: small amount of dark green effluent in pouch <100 mls. Ostomy pouching: 1pc convex ostomy pouching system with skin barrier ring.  Education provided: Patient assured that she and her sister would receive the necessary education to manage this ostomy and that they would be independent with support from ostomy RN post discharge. Patient expresses relief at seeing ostomy nurse today and after reassurance  That ostomy was "manageable".  I will see again tomorrow when I can see her sister and support system, Vaughan Basta. Enrolled patient in [place] program: No  WOC nursing team will follow, and will remain available to this patient, the nursing and medical teams.   Thanks, Maudie Flakes, MSN, RN, [place], Arther Abbott  Pager# [phone]

## 2017-03-26 LAB — BASIC METABOLIC PANEL
ANION GAP: 4 — AB (ref 5–15)
BUN: 12 mg/dL (ref 6–20)
CHLORIDE: 118 mmol/L — AB (ref 101–111)
CO2: 22 mmol/L (ref 22–32)
CREATININE: 0.99 mg/dL (ref 0.44–1.00)
Calcium: 8.4 mg/dL — ABNORMAL LOW (ref 8.9–10.3)
GFR calc non Af Amer: 54 mL/min — ABNORMAL LOW (ref >60)
Glucose, Bld: 101 mg/dL — ABNORMAL HIGH (ref 65–99)
POTASSIUM: 3.7 mmol/L (ref 3.5–5.1)
SODIUM: 144 mmol/L (ref 135–145)

## 2017-03-26 LAB — CBC
HCT: 36.9 % (ref 36.0–46.0)
HEMOGLOBIN: 11.5 g/dL — AB (ref 12.0–15.0)
MCH: 32.4 pg (ref 26.0–34.0)
MCHC: 31.2 g/dL (ref 30.0–36.0)
MCV: 103.9 fL — ABNORMAL HIGH (ref 78.0–100.0)
Platelets: 213 10*3/uL (ref 150–400)
RBC: 3.55 MIL/uL — AB (ref 3.87–5.11)
RDW: 13.1 % (ref 11.5–15.5)
WBC: 11.8 10*3/uL — AB (ref 4.0–10.5)

## 2017-03-26 LAB — MAGNESIUM: MAGNESIUM: 2.1 mg/dL (ref 1.7–2.4)

## 2017-03-26 MED ORDER — PANTOPRAZOLE SODIUM 40 MG IV SOLR
40.0000 mg | Freq: Two times a day (BID) | INTRAVENOUS | Status: DC
  Administered 2017-03-26 – 2017-03-29 (×7): 40 mg via INTRAVENOUS
  Filled 2017-03-26 (×7): qty 40

## 2017-03-26 MED ORDER — ACETAMINOPHEN 160 MG/5ML PO SOLN
1000.0000 mg | Freq: Three times a day (TID) | ORAL | Status: DC | PRN
  Administered 2017-03-28: 1000 mg via ORAL
  Filled 2017-03-26: qty 40.6

## 2017-03-26 MED ORDER — KCL IN DEXTROSE-NACL 40-5-0.9 MEQ/L-%-% IV SOLN
INTRAVENOUS | Status: DC
  Administered 2017-03-26 – 2017-03-29 (×5): via INTRAVENOUS
  Filled 2017-03-26 (×5): qty 1000

## 2017-03-26 NOTE — Consult Note (Addendum)
   [facility] CM Inpatient Consult   03/26/2017  Sara Barnes Jun 26, 1940 [id number]    Patient screened for [place] Management program due to having [employer] HMO insurance.   Chart reviewed and spoke with inpatient RNCM to confirm there are no identifiable [facility] Care Management needs at this time.   Of note, patient has had only 1 hospitalization within the past 6 months.    Marthenia Rolling, MSN-Ed, RN,BSN [facility] Liaison [phone]

## 2017-03-26 NOTE — Progress Notes (Signed)
3 Days Post-Op    CC:  Subjective: Complained of chest pain a couple times yesterday, Rapid Response early this AM and issues resolved with Prn dilaudid and irrigation of the NG. She actually looks pretty good this a.m.  There is gas and some ileostomy drainage and her bag this morning.  She really hates the NG tube.  The open abdominal wound looks good.  There was a side and the lower portion that was oozing some.  I repacked it wet to dry will recheck it later.  Objective: Vital signs in last 24 hours: Temp:  [97.7 F (36.5 C)-98.5 F (36.9 C)] 98.5 F (36.9 C) (11/29 0809) Pulse Rate:  [66-84] 66 (11/29 0809) Resp:  [15-18] 18 (11/29 0400) BP: (115-150)/(61-73) 115/67 (11/29 0809) SpO2:  [92 %-98 %] 96 % (11/29 0809)    NPO 2850 IV 850 urine NG 850  Nothing from ileostomy recorded Afebrile, VSS Labs OK    Intake/Output from previous day: 11/28 0701 - 11/29 0700 In: 2850 [I.V.:2640; IV Piggyback:210] Out: 1700 [Urine:850; Emesis/NG output:850] Intake/Output this shift: No intake/output data recorded.  General appearance: alert, cooperative and no distress Resp: Clear except for some rales in the left base. GI: Soft, no distention, extremely tender to any palpation.  Gas and ileostomy drainage in the back this a.m.  Ileostomy looks fine.  Lab Results:  Recent Labs    03/24/17 2351 03/26/17 0010  WBC 16.4* 11.8*  HGB 13.5 11.5*  HCT 42.4 36.9  PLT 255 213    BMET Recent Labs    03/25/17 0319 03/26/17 0504  NA 144 144  K 4.2 3.7  CL 116* 118*  CO2 21* 22  GLUCOSE 135* 101*  BUN 11 12  CREATININE 1.35* 0.99  CALCIUM 8.4* 8.4*   PT/INR Recent Labs    03/23/17 2003  LABPROT 13.4  INR 1.03    Recent Labs  Lab 03/23/17 2003  AST 23  ALT 16  ALKPHOS 85  BILITOT 1.0  PROT 6.7  ALBUMIN 3.5     Lipase     Component Value Date/Time   LIPASE 21 06/17/2016 0158   Prior to Admission medications   Medication Sig Start Date End Date Taking?  Authorizing Provider  Cyanocobalamin (VITAMIN B-12 IJ) Inject 1 mL as directed every 30 (thirty) days.   Yes [provider]  famotidine (PEPCID) 40 MG tablet Take 1 tablet (40 mg total) by mouth 2 (two) times daily. Patient taking differently: Take 40 mg by mouth daily as needed for heartburn or indigestion.  02/29/16  Yes Meuth, Brooke A, PA-C  FLUoxetine (PROZAC) 20 MG capsule TAKE 1 CAPSULE (20 MG TOTAL) BY MOUTH 2 (TWO) TIMES DAILY. 12/18/16  Yes Pyrtle, Lajuan Lines, MD  ondansetron (ZOFRAN ODT) 4 MG disintegrating tablet Take 1 tablet (4 mg total) by mouth every 6 (six) hours as needed for nausea or vomiting. 03/17/17  Yes Pyrtle, Lajuan Lines, MD  predniSONE (DELTASONE) 5 MG tablet Take 5 mg by mouth daily.  06/05/16  Yes [provider]  zolpidem (AMBIEN) 10 MG tablet TAKE 1/2 TO 1 TABLET BY MOUTH EVERY NIGHT AS NEEDED FOR SLEEP 02/04/17  Yes Pyrtle, Lajuan Lines, MD  Certolizumab Pegol (CIMZIA PREFILLED) 2 X 200 MG/ML KIT Inject 400 mg every 28 (twenty-eight) days into the skin. Patient not taking: Reported on 03/23/2017 03/10/17   Pyrtle, Lajuan Lines, MD  Certolizumab Pegol (CIMZIA STARTER KIT) 6 X 200 MG/ML KIT Inject 419m SQ day zero Inject 4020mSQ in  2 weeks Inject 44m SQ in 4 weeks Patient not taking: Reported on 03/23/2017 03/10/17   PJerene Bears MD  simvastatin (ZOCOR) 20 MG tablet Take 1 tablet (20 mg total) by mouth daily. Patient not taking: Reported on 03/23/2017 01/15/17   LRoma SchanzR, DO      Medications: . enoxaparin (LOVENOX) injection  40 mg Subcutaneous Q24H  . FLUoxetine  20 mg Oral Daily  . methylPREDNISolone (SOLU-MEDROL) injection  5.2 mg Intravenous Q24H  . pantoprazole (PROTONIX) IV  40 mg Intravenous QHS   . dextrose 5 % and 0.9 % NaCl with KCl 20 mEq/L 50 mL/hr at 03/25/17 1406  . methocarbamol (ROBAXIN)  IV 500 mg (03/26/17 01157  . piperacillin-tazobactam (ZOSYN)  IV 3.375 g (03/26/17 0233)   Anti-infectives (From admission, onward)   Start      Dose/Rate Route Frequency Ordered Stop   03/24/17 0230  piperacillin-tazobactam (ZOSYN) IVPB 3.375 g     3.375 g 12.5 mL/hr over 240 Minutes Intravenous Every 8 hours 03/24/17 0217     03/23/17 2030  vancomycin (VANCOCIN) 1,500 mg in sodium chloride 0.9 % 500 mL IVPB     1,500 mg 250 mL/hr over 120 Minutes Intravenous  Once 03/23/17 1932 03/23/17 2325   03/23/17 1930  piperacillin-tazobactam (ZOSYN) IVPB 3.375 g     3.375 g 100 mL/hr over 30 Minutes Intravenous  Once 03/23/17 1917 03/23/17 2115      Assessment/Plan Perforated cecum, 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 MJohnathan Hausen   On Cimzia/prednisone at home  Diagnostic laparoscopy, exploratory laparotomy and right colectomy, 03/24/17 Dr. TJackolyn Confer History of atrial fibrillation History of breast cancer GERD  FEN: IV fluids/n.p.o. except ice chips ID: Vancomycin 11/20 6/18 x 1; Zosyn 11/26 =>> day 4 DVT: Lovenox Foley: Out Follow up: To be determined   Plan: We will do some clamping trials with her NG today.  We will going to give her some sips of clear liquids from the floor for comfort and see how she does with those.  Add some  POTylenol for pain control and work to mobilize her more. I have placed more K+ in her IV and checking Mag this AM.    LOS: 3 days    Nicky Milhouse 03/26/2017 [phone]

## 2017-03-26 NOTE — Progress Notes (Signed)
   03/26/17 0400  Vitals  Temp 97.7 F (36.5 C)  Temp Source Oral  BP 130/71  BP Location Left Arm  BP Method Automatic  Patient Position (if appropriate) Lying  Pulse Rate 79  Pulse Rate Source Dinamap  Resp 18  Oxygen Therapy  SpO2 97 %  O2 Device Room Air  Pain Assessment  Pain Assessment 0-10  Pain Score (Patient couldn't rate the pain)  Pain Location Chest (Patient states that it could be caused by the NGT.)  Pain Descriptors / Indicators Pressure (Patient states the chest pain increases with deep breaths.)  Pain Intervention(s) Medication (See eMAR);Repositioned (Patient states the chest pain decreases with position change)   Rapid Response nurse present at the patient bedside.  Dilaudid 58m  Given. Dr. TMarcello Mooresnotified.

## 2017-03-26 NOTE — Progress Notes (Signed)
     [facility] Progress Note  Chief Complaint:    Crohn's / bowel perforation  Subjective: Didn't sleep well. She "good' today. Son and sister visiting.   Objective:  Vital signs in last 24 hours: Temp:  [97.7 F (36.5 C)-98.5 F (36.9 C)] 98.5 F (36.9 C) (11/29 0809) Pulse Rate:  [66-84] 66 (11/29 0809) Resp:  [16-18] 16 (11/29 0809) BP: (115-130)/(61-73) 115/67 (11/29 0809) SpO2:  [92 %-97 %] 96 % (11/29 0809)   General:   Alert, well-developed, white female in NAD EENT:  Normal hearing, non icteric sclera, conjunctive pink.  Heart:  Regular rate and rhythm; no murmurs. no lower extremity edema Pulm: Normal respiratory effort Abdomen:  Soft, nondistended, surgical dressing in place. Semi-solid stool in ostomy bag.  Faint diffuse rash over abdomen .    Neurologic:  Alert and  oriented x4;  grossly normal neurologically. Psych:  Pleasant, cooperative.  Normal mood and affect.   Intake/Output from previous day: 11/28 0701 - 11/29 0700 In: 2850 [I.V.:2640; IV Piggyback:210] Out: 1700 [Urine:850; Emesis/NG output:850] Intake/Output this shift: Total I/O In: -  Out: 550 [Urine:200; Emesis/NG output:350]  Lab Results: Recent Labs    03/24/17 0547 03/24/17 2351 03/26/17 0010  WBC 22.1* 16.4* 11.8*  HGB 14.9 13.5 11.5*  HCT 44.5 42.4 36.9  PLT 245 255 213   BMET Recent Labs    03/24/17 0547 03/25/17 0319 03/26/17 0504  NA 138 144 144  K 3.9 4.2 3.7  CL 113* 116* 118*  CO2 18* 21* 22  GLUCOSE 179* 135* 101*  BUN 8 11 12   CREATININE 0.88 1.35* 0.99  CALCIUM 8.2* 8.4* 8.4*   LFT Recent Labs    03/23/17 2003  PROT 6.7  ALBUMIN 3.5  AST 23  ALT 16  ALKPHOS 85  BILITOT 1.0   PT/INR Recent Labs    03/23/17 2003  LABPROT 13.4  INR 1.03    ASSESSMENT / PLAN:   76 year female with Crohn's colitis, perianal fistula and end ileostomy. She sustained cecal perforation post-colonoscopy and is s/p exp lap and right hemicolectomy.  -She  is on low dose IV steroids and Zosyn. Her pain is better today. Ambulating. Still has NGT.  -Will eventually need Cimzia as outpatient, our office is trying to help her obtain -Management is Surgical now. GI will see again on Monday. Please call in the interim for problems/questions.   Active Problems:   S/P laparoscopy   Perforated viscus    LOS: 3 days   Tye Savoy ,NP 03/26/2017, 10:21 AM  Pager number [phone]    Attending physician's note   I have taken an interval history, reviewed the chart and examined the patient. I agree with the Advanced Practitioner's note, impression and recommendations.   Lucio Edward, MD [name] [phone] Mon-Fri 8a-5p [phone] after 5p, weekends, holidays

## 2017-03-26 NOTE — Significant Event (Signed)
Rapid Response Event Note  Overview: Time Called: 0425 Arrival Time: 0430 Event Type: Cardiac(Chest Pain)  Initial Focused Assessment:  Primary RN Vira Agar) called me to patient's bedside d/t pt having a complaint of chest pain.  On my arrival to bedside, pt resting in bed, A/Ox4, no obvious signs of distress.  Skin warm and dry.  Pt stated chest pain started around 0400, describes it as substernal in nature, when asked to point to pain she moved hand between epigastric region and throat.  When asked to further to describe pain pt stated it was more of a burning pain.  She is very vague in describing pain.  Aggravating factors included taking a deep breath and movement.  12-lead EKG done prior to my arrival, computer diagnosis of septal infarct age undetermined shows on EKG, however, I did not see any significant ST elevation on EKG.  Vital signs all stable (T-97.7 P-77 R-18 BP 138/76 Pulse Ox 97% on Room Air).  Pt already had an order for PRN Dialudid, advised Vira Agar to give.  NGT had good output, but flushed with 58m of Sterile Water, with improvement in pain, unsure if it was the Dilaudid or the Irrigation of the NGT.  After further discussion with patient, the patient feels that the pain is coming from the NGT.  Due to changes in pain with deep breathing and movement feel pain is not cardiac, but to error on the side of caution, I advised PVira Agarto notify Dr. TMarcello Mooresof above and to recommend a Troponin level.  I stayed in room until Dr. TMarcello Moorescalled back.  PVira Agaradvised Dr. TMarcello Mooresof above, no new orders given.  Pt does now state pain is improving and almost gone.   Interventions:  12-lead EKG, Vital Signs, Notified MD  Plan of Care (if not transferred):  I advised patient to notify PVira Agarfor any change or increase in pain.  I advised PVira Agarto call back Rapid response for any change in condition at [phone]  Event Summary:  Name of Physician Notified: Dr. TMarcello Mooresat [phone]  at    Outcome: Stayed in room and stabalized  Event End Time: [place] Care Coordinator / Rapid Response Nurse

## 2017-03-26 NOTE — Progress Notes (Signed)
Patient c/o chest pain, alert and oriented x4. Patient states that the chest pain increases when she takes a deep breath. 12-lead EKG done. Normal Sinus rhythm read.  Vital signs stable.  Rapid Response called for further assessment.

## 2017-03-26 NOTE — Consult Note (Signed)
[place] Nurse ostomy follow up Stoma type/location: RLQ colostomy. Pouch applied yesterday is intact. Sister in room and expresses appreciation for pouch change and stoma assessment yesterday We discuss that she will continue to support her sister with the ostomy. She is unwilling to perform midline wound care.  She is reassured that there are many things we can do prior to discharge to make that easier for [facility] to do at an elongated frequency and that wound may be much more healed by the time of discharge. Stomal assessment/size: per yesterday, 7/8 inch Peristomal assessment: not seen today Treatment options for stomal/peristomal skin: skin barrier ring in place Output green effluent Ostomy pouching: 1pc.convex pouch with skin barrier ring Education provided: Support today Enrolled patient in [place] Start Discharge program: No  WOC Nurse wound consult note Reason for Consult:Requested to inspect wound as there is serosanguinous exudate on distal portion of dressing applied this am. Wound type:surgical Pressure Injury POA: NA Measurement:12cm x 1cm x 1cm Wound bed:red, moist Drainage (amount, consistency, odor) distal end with serosanguinous exudate in a moderate amount. No frank bleeding. Periwound:intact Dressing procedure/placement/frequency: replaced packing with saline moistened gauze 4x4, opened. Topped with folded gauze 4x4s, ABD and secured with tape. Ordered adjusted for twice daily and PRN dressing changes for drainage strike-through.   Will see tomorrow.  [place] nursing team will not follow, but will remain available to this patient, the nursing and medical teams.  Please re-consult if needed. Thanks, Maudie Flakes, MSN, RN, [place], Arther Abbott  Pager# [phone]

## 2017-03-26 NOTE — Care Management Important Message (Signed)
Important Message  Patient Details  Name: Sara Barnes MRN: [id number] Date of Birth: [birth year]   Medicare Important Message Given:  Yes    Kerin Salen 03/26/2017, 11:05 AMImportant Message  Patient Details  Name: Sara Barnes MRN: [id number] Date of Birth: 1940-07-21   Medicare Important Message Given:  Yes    Kerin Salen 03/26/2017, 11:04 AM

## 2017-03-26 NOTE — Progress Notes (Signed)
   03/26/17 0400  Vitals  Temp 97.7 F (36.5 C) (Patient c/o chest pain)  Temp Source Oral  BP 130/71  BP Location Left Arm  BP Method Automatic  Patient Position (if appropriate) Lying  Pulse Rate 79  Pulse Rate Source Dinamap  Resp 18  Oxygen Therapy  SpO2 97 %  O2 Device Room Air  Pain Assessment  Pain Assessment 0-10

## 2017-03-27 ENCOUNTER — Telehealth: Payer: Self-pay | Admitting: Internal Medicine

## 2017-03-27 LAB — BASIC METABOLIC PANEL
Anion gap: 4 — ABNORMAL LOW (ref 5–15)
BUN: 9 mg/dL (ref 6–20)
CO2: 21 mmol/L — AB (ref 22–32)
Calcium: 8.2 mg/dL — ABNORMAL LOW (ref 8.9–10.3)
Chloride: 119 mmol/L — ABNORMAL HIGH (ref 101–111)
Creatinine, Ser: 0.81 mg/dL (ref 0.44–1.00)
GFR calc Af Amer: >60 mL/min (ref >60)
GLUCOSE: 100 mg/dL — AB (ref 65–99)
POTASSIUM: 4 mmol/L (ref 3.5–5.1)
Sodium: 144 mmol/L (ref 135–145)

## 2017-03-27 LAB — CBC
HEMATOCRIT: 32.6 % — AB (ref 36.0–46.0)
Hemoglobin: 10.3 g/dL — ABNORMAL LOW (ref 12.0–15.0)
MCH: 33.3 pg (ref 26.0–34.0)
MCHC: 31.6 g/dL (ref 30.0–36.0)
MCV: 105.5 fL — AB (ref 78.0–100.0)
PLATELETS: 187 10*3/uL (ref 150–400)
RBC: 3.09 MIL/uL — AB (ref 3.87–5.11)
RDW: 13.1 % (ref 11.5–15.5)
WBC: 7.1 10*3/uL (ref 4.0–10.5)

## 2017-03-27 MED ORDER — SIMETHICONE 80 MG PO CHEW
80.0000 mg | CHEWABLE_TABLET | Freq: Four times a day (QID) | ORAL | Status: DC | PRN
  Administered 2017-03-27: 80 mg via ORAL
  Filled 2017-03-27: qty 1

## 2017-03-27 NOTE — Progress Notes (Signed)
PT Cancellation Note  Patient Details Name: MITRA DULING MRN: [id number] DOB: 01-Aug-1940   Cancelled Treatment:    Reason Eval/Treat Not Completed:  Attempted PT evaluation. Pt politely requested PT check back another day. Pt stated she has walked several times with nursing on today and she just wants to rest now.   Weston Anna, MPT Pager: [phone]

## 2017-03-27 NOTE — Plan of Care (Signed)
  Activity: Risk for activity intolerance will decrease 03/27/2017 2003 - Progressing by Dorene Sorrow, RN   Nutrition: Adequate nutrition will be maintained 03/27/2017 2003 - Progressing by Dorene Sorrow, RN   Nutrition: Adequate nutrition will be maintained 03/27/2017 2003 - Progressing by Dorene Sorrow, RN

## 2017-03-27 NOTE — Progress Notes (Signed)
03/27/17  0945  Patients IV continues to beep. Asked patient if she wanted a new IV. Patient states she does not want a new IV now, but later today she may. Informed patient to let me know whenever she wants the new IV, patient verbalized understanding.

## 2017-03-27 NOTE — Progress Notes (Signed)
4 Days Post-Op    CC: Abdominal pain  Subjective: Patient sleeping and seems pretty comfortable.  No nausea or vomiting she did have 200, out of her NG so they continued her suction now with a total of 1050 per the NG yesterday.  Abdominal wound looks good.  She has gas and drainage in her ileostomy. Objective: Vital signs in last 24 hours: Temp:  [98.1 F (36.7 C)-98.4 F (36.9 C)] 98.4 F (36.9 C) (11/30 0532) Pulse Rate:  [66-74] 68 (11/30 0532) Resp:  [16-17] 16 (11/30 0532) BP: (114-127)/(64-71) 114/71 (11/30 0532) SpO2:  [96 %-97 %] 97 % (11/30 0532) Last BM Date: 03/23/17 90 po RECORDED 1700 IV 875 urine 1050 NG Afebrile, VSS Labs are all good  Intake/Output from previous day: 11/29 0701 - 11/30 0700 In: 1796.3 [P.O.:90; I.V.:1446.3; IV Piggyback:260] Out: 1925 [Urine:875; Emesis/NG output:1050] Intake/Output this shift: No intake/output data recorded.  General appearance: alert, cooperative and no distress Resp: few rales at bases, needs to do IS GI: soft, sore, open site looks fine.  Gas and ileostomy drainage in bag.  Lab Results:  Recent Labs    03/26/17 0010 03/27/17 0515  WBC 11.8* 7.1  HGB 11.5* 10.3*  HCT 36.9 32.6*  PLT 213 187    BMET Recent Labs    03/26/17 0504 03/27/17 0515  NA 144 144  K 3.7 4.0  CL 118* 119*  CO2 22 21*  GLUCOSE 101* 100*  BUN 12 9  CREATININE 0.99 0.81  CALCIUM 8.4* 8.2*   PT/INR No results for input(s): LABPROT, INR in the last 72 hours.  Recent Labs  Lab 03/23/17 2003  AST 23  ALT 16  ALKPHOS 85  BILITOT 1.0  PROT 6.7  ALBUMIN 3.5     Lipase     Component Value Date/Time   LIPASE 21 06/17/2016 0158     Medications: . enoxaparin (LOVENOX) injection  40 mg Subcutaneous Q24H  . FLUoxetine  20 mg Oral Daily  . methylPREDNISolone (SOLU-MEDROL) injection  5.2 mg Intravenous Q24H  . pantoprazole (PROTONIX) IV  40 mg Intravenous Q12H   Anti-infectives (From admission, onward)   Start      Dose/Rate Route Frequency Ordered Stop   03/24/17 0230  piperacillin-tazobactam (ZOSYN) IVPB 3.375 g     3.375 g 12.5 mL/hr over 240 Minutes Intravenous Every 8 hours 03/24/17 0217     03/23/17 2030  vancomycin (VANCOCIN) 1,500 mg in sodium chloride 0.9 % 500 mL IVPB     1,500 mg 250 mL/hr over 120 Minutes Intravenous  Once 03/23/17 1932 03/23/17 2325   03/23/17 1930  piperacillin-tazobactam (ZOSYN) IVPB 3.375 g     3.375 g 100 mL/hr over 30 Minutes Intravenous  Once 03/23/17 1917 03/23/17 2115      Assessment/Plan Perforated cecum, 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 Johnathan Hausen.   On Cimzia/prednisone at home  Diagnostic laparoscopy, exploratory laparotomy and right colectomy, 03/24/17 Dr. Jackolyn Confer  POD 3  History of atrial fibrillation History of breast cancer GERD  FEN: IV fluids/sips and chips  =>> go to clears now ID: Vancomycin 11/20 6/18 x 1; Zosyn 11/26 =>> day 5 DVT: Lovenox Foley: Out Follow up: To be determined    Plan:  Clamp NG and remove at noon if no nausea.   Start her on clears now.  Continue to mobilize.  LOS: 4 days    Sara Barnes 03/27/2017 [phone]

## 2017-03-27 NOTE — Telephone Encounter (Signed)
Diagnosis K51.014 given to [facility].

## 2017-03-27 NOTE — Progress Notes (Signed)
03/27/17  1051  Notified Will, PA that patient family is requesting for patient to have a sitz bath.

## 2017-03-28 LAB — CULTURE, BLOOD (ROUTINE X 2)
CULTURE: NO GROWTH
SPECIAL REQUESTS: ADEQUATE

## 2017-03-28 MED ORDER — ZOLPIDEM TARTRATE 5 MG PO TABS
5.0000 mg | ORAL_TABLET | Freq: Every evening | ORAL | Status: DC | PRN
  Administered 2017-03-28 – 2017-03-30 (×3): 5 mg via ORAL
  Filled 2017-03-28 (×3): qty 1

## 2017-03-28 NOTE — Evaluation (Signed)
Physical Therapy Evaluation Patient Details Name: Sara Barnes MRN: [id number] DOB: 09-Dec-1940 Today's Date: 03/28/2017   History of Present Illness  This is a 76 year old female with severe colonic Crohn's disease with anorectal manifestations (fistula). She was on Humira but stopped that 3 weeks ago. She has also been on prednisone. She's had chronic right lower quadrant pain, rectal bleeding, nausea for the past 6 weeks by her report. Had increased bleeding after colonoscopy and underwent R hemicolectomy.  Clinical Impression  Patient evaluated by Physical Therapy with no further acute PT needs identified. All education has been completed and the patient has no further questions. Pt has been ambulating on unit consistently with use of RW. Discussed progression of ambulation and activity level at home. Pt would benefit from knee height TED hose for circulation issues in LE's before return home.  See below for any follow-up Physical Therapy or equipment needs. PT is signing off. Thank you for this referral.     Follow Up Recommendations No PT follow up    Equipment Recommendations  None recommended by PT    Recommendations for Other Services       Precautions / Restrictions Precautions Precautions: None Restrictions Weight Bearing Restrictions: No Other Position/Activity Restrictions: ostomy      Mobility  Bed Mobility Overal bed mobility: Modified Independent             General bed mobility comments: able to get to EOB independently  Transfers Overall transfer level: Modified independent Equipment used: Rolling walker (2 wheeled)             General transfer comment: discussed hand placement with RW, pt able to get up and down safely  Ambulation/Gait             General Gait Details: pt had just ambulated 1/2 unit and did not want to go again so had her ambulate in place to test strength and balance  Stairs            Wheelchair Mobility     Modified Rankin (Stroke Patients Only)       Balance Overall balance assessment: No apparent balance deficits (not formally assessed)                                           Pertinent Vitals/Pain Pain Assessment: Faces Faces Pain Scale: Hurts little more Pain Location: abdomen Pain Descriptors / Indicators: Aching Pain Intervention(s): Limited activity within patient's tolerance;Monitored during session    Home Living Family/patient expects to be discharged to:: Private residence Living Arrangements: Alone Available Help at Discharge: Family;Available 24 hours/day   Home Access: Stairs to enter   Entrance Stairs-Number of Steps: 4 Home Layout: Multi-level;Able to live on main level with bedroom/bathroom Home Equipment: Gilford Rile - 2 wheels;Shower seat Additional Comments: sister can stay with her as needed    Prior Function Level of Independence: Independent with assistive device(s)         Comments: has been ambulating with RW     Hand Dominance   Dominant Hand: Right    Extremity/Trunk Assessment   Upper Extremity Assessment Upper Extremity Assessment: Overall WFL for tasks assessed    Lower Extremity Assessment Lower Extremity Assessment: Overall WFL for tasks assessed    Cervical / Trunk Assessment Cervical / Trunk Assessment: Normal  Communication   Communication: HOH  Cognition Arousal/Alertness: Awake/alert Behavior During Therapy: [facility]  for tasks assessed/performed Overall Cognitive Status: Within Functional Limits for tasks assessed                                 General Comments: WFL in simple conversation but she did look to her sister for some answers to questions      General Comments General comments (skin integrity, edema, etc.): discussed activity level at home. Pt has circulation deficits in LE's and really likes SCD's. Discussed use of TED hose as well to address this, relayed to RN    Exercises  General Exercises - Lower Extremity Hip Flexion/Marching: AROM;Both;10 reps;Standing Mini-Sqauts: AROM;Both;10 reps;Standing   Assessment/Plan    PT Assessment Patent does not need any further PT services  PT Problem List         PT Treatment Interventions      PT Goals (Current goals can be found in the Care Plan section)  Acute Rehab PT Goals Patient Stated Goal: return home    Frequency     Barriers to discharge        Co-evaluation               AM-PAC PT "6 Clicks" Daily Activity  Outcome Measure Difficulty turning over in bed (including adjusting bedclothes, sheets and blankets)?: A Little Difficulty moving from lying on back to sitting on the side of the bed? : A Little Difficulty sitting down on and standing up from a chair with arms (e.g., wheelchair, bedside commode, etc,.)?: None Help needed moving to and from a bed to chair (including a wheelchair)?: None Help needed walking in hospital room?: None Help needed climbing 3-5 steps with a railing? : None 6 Click Score: 22    End of Session   Activity Tolerance: Patient tolerated treatment well Patient left: in bed;with call bell/phone within reach;with family/visitor present Nurse Communication: Mobility status PT Visit Diagnosis: Pain Pain - part of body: (abdomen)    Time: 8616-8372 PT Time Calculation (min) (ACUTE ONLY): 22 min   Charges:   PT Evaluation $PT Eval Moderate Complexity: 1 Mod     PT G Codes:        Leighton Roach, PT  Acute Rehab Services  [place] 03/28/2017, 2:00 PM

## 2017-03-29 NOTE — Progress Notes (Signed)
Patient ID: Sara Barnes, female   DOB: Nov 24, 1940, 76 y.o.   MRN: [id number] [facility] Progress Note:   6 Days Post-Op  Subjective: Mental status is clear Objective: Vital signs in last 24 hours: Temp:  [97.9 F (36.6 C)-98.3 F (36.8 C)] 97.9 F (36.6 C) (12/02 0627) Pulse Rate:  [63-70] 63 (12/02 0627) Resp:  [16-18] 16 (12/02 0627) BP: (124-130)/(68-74) 130/73 (12/02 0627) SpO2:  [97 %-99 %] 99 % (12/02 0627)  Intake/Output from previous day: 12/01 0701 - 12/02 0700 In: 4229.2 [P.O.:1530; I.V.:2229.2; IV Piggyback:470] Out: 2700 [Urine:1000; Stool:1700] Intake/Output this shift: Total I/O In: 360 [P.O.:360] Out: -   Physical Exam: Work of breathing is normal.  Was sleeping soundly.    Lab Results:  No results found for this or any previous visit (from the past 48 hour(s)).  Radiology/Results: No results found.  Anti-infectives: Anti-infectives (From admission, onward)   Start     Dose/Rate Route Frequency Ordered Stop   03/24/17 0230  piperacillin-tazobactam (ZOSYN) IVPB 3.375 g     3.375 g 12.5 mL/hr over 240 Minutes Intravenous Every 8 hours 03/24/17 0217     03/23/17 2030  vancomycin (VANCOCIN) 1,500 mg in sodium chloride 0.9 % 500 mL IVPB     1,500 mg 250 mL/hr over 120 Minutes Intravenous  Once 03/23/17 1932 03/23/17 2325   03/23/17 1930  piperacillin-tazobactam (ZOSYN) IVPB 3.375 g     3.375 g 100 mL/hr over 30 Minutes Intravenous  Once 03/23/17 1917 03/23/17 2115      Assessment/Plan: Problem List: Patient Active Problem List   Diagnosis Date Noted  . Perforated viscus 03/24/2017  . S/P laparoscopy 03/23/2017  . Chronic diastolic CHF (congestive heart failure) ([place]) 08/05/2016  . Atrial fibrillation with RVR ([place]) 06/24/2016  . Chest discomfort 06/21/2016  . Nausea and vomiting   . Small bowel obstruction ([place])   . Ileus ([place])   . Generalized anxiety disorder 02/14/2016  . 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 Hypokalemia   . Fever    . Other depression due to general medical condition   . Difficulty in walking, not elsewhere classified   . Acute blood loss anemia   . Absolute anemia   . Left upper quadrant pain   . Abdominal pain 01/07/2016  . GI bleed 01/07/2016  . IBS (irritable bowel syndrome)   . Ten[place]mus   . Ulcerative pancolitis with rectal bleeding ([place])   . Diarrhea 12/24/2015  . Crohn disease ([place]) 12/24/2015  . Dehydration   . Ulcerative pancolitis with abscess ([place])   . Rectal pain   . Leucocytosis   . Cancer of lower-inner quadrant of left female breast ([place]) 11/30/2014  . Perirectal abscess 09/29/2013  . Depression 08/12/2006    Diet advanced yesterday and she is tolerating.  Slow progress.   6 Days Post-Op    LOS: 6 days   Matt B. Hassell Done, MD, [facility] [phone] beeper [phone]  03/29/2017 10:04 AM

## 2017-03-30 DIAGNOSIS — Z932 Ileostomy status: Secondary | ICD-10-CM

## 2017-03-30 DIAGNOSIS — K50918 Crohn's disease, unspecified, with other complication: Principal | ICD-10-CM

## 2017-03-30 DIAGNOSIS — K632 Fistula of intestine: Secondary | ICD-10-CM

## 2017-03-30 MED ORDER — TRAMADOL HCL 50 MG PO TABS: 50.0000 mg | ORAL_TABLET | Freq: Four times a day (QID) | ORAL | Status: DC | PRN

## 2017-03-30 MED ORDER — PANTOPRAZOLE SODIUM 40 MG PO TBEC
40.0000 mg | DELAYED_RELEASE_TABLET | Freq: Two times a day (BID) | ORAL | Status: DC
  Administered 2017-03-30 – 2017-03-31 (×3): 40 mg via ORAL
  Filled 2017-03-30 (×3): qty 1

## 2017-03-30 MED ORDER — HYDROMORPHONE HCL 1 MG/ML IJ SOLN: 1.0000 mg | INTRAMUSCULAR | Status: DC | PRN

## 2017-03-30 MED ORDER — TRAMADOL HCL 50 MG PO TABS
50.0000 mg | ORAL_TABLET | Freq: Four times a day (QID) | ORAL | Status: DC | PRN
  Administered 2017-03-30 – 2017-03-31 (×4): 50 mg via ORAL
  Filled 2017-03-30 (×2): qty 1
  Filled 2017-03-30: qty 2
  Filled 2017-03-30: qty 1

## 2017-03-30 MED ORDER — HYDROMORPHONE HCL 1 MG/ML IJ SOLN
1.0000 mg | INTRAMUSCULAR | Status: DC | PRN
  Administered 2017-03-30: 1 mg via INTRAVENOUS
  Filled 2017-03-30 (×2): qty 1

## 2017-03-30 MED ORDER — ACETAMINOPHEN 500 MG PO TABS
1000.0000 mg | ORAL_TABLET | Freq: Three times a day (TID) | ORAL | Status: DC
  Administered 2017-03-30 – 2017-03-31 (×3): 1000 mg via ORAL
  Filled 2017-03-30 (×3): qty 2

## 2017-03-30 MED ORDER — PREDNISONE 5 MG PO TABS
5.0000 mg | ORAL_TABLET | Freq: Every day | ORAL | Status: DC
  Administered 2017-03-31: 5 mg via ORAL
  Filled 2017-03-30: qty 1

## 2017-03-30 NOTE — Progress Notes (Signed)
    Progress Note   Subjective  Chief Complaint: Crohn's, Bowel Perforation  This morning, the patient is found laying in bed with her son by her side. She tells me that in general she has been feeling better over the past few days, but this morning feels somewhat more lethargic. She does ask if she could have soft foods today and denies any new complaints or concerns.   Objective   Vital signs in last 24 hours: Temp:  [98.2 F (36.8 C)-98.4 F (36.9 C)] 98.4 F (36.9 C) (12/03 0624) Pulse Rate:  [67-69] 68 (12/03 0624) Resp:  [16] 16 (12/03 0624) BP: (115-135)/(65-76) 135/76 (12/03 0624) SpO2:  [97 %-98 %] 98 % (12/03 0624) Last BM Date: 03/30/17 General:    Elderly Caucasian female in NAD Heart:  Regular rate and rhythm; no murmurs Lungs: Respirations even and unlabored, lungs CTA bilaterally Abdomen:  Soft, mild generalized ttp and nondistended. Normal bowel sounds.ostomy in place with liquid brown stool, surgical dressing clean and dry in place Extremities:  Without edema. Neurologic:  Alert and oriented,  grossly normal neurologically. Psych:  Cooperative. Normal mood and affect.    Assessment / Plan:   Assessment: 1.  Crohn's colitis, perianal fistula and end ileostomy: Patient sustained a cecal perforation post-colonoscopy and is status post ex lap and right hemicolectomy- 7 days post-op, improving  Plan: 1.  Patient will be arranged for Cimzia as an outpatient- per our notes, Rosanne Sack, RN in our office is working on this for Dr. Hilarie Fredrickson. 2.  Continue to appreciate surgical recommendations 3.  Surgical team switched to soft foods today 4. Will switch patient from IV steroids to home dose of PO steroids, Prednisone 3m PO qd 5. Please await final recommendations from Dr. JArdis Hughslater today  Thank you for your kind consultation, we will continue to follow.    LOS: 7 days   JLevin Erp 03/30/2017, 10:04 AM  Pager #  [phone]  ________________________________________________________________________  LVelora HecklerGI MD note:  I personally examined the patient, reviewed the data and agree with the assessment and plan described above.  She is recovering well from right hemicolectomy about a week ago.  Liquid and gas in her ostomy, advancing diet, slowly getting her strength back, only very minimal blood per rectum (much less than prior to surgery).  We are putting her back on her usual prednisone dose and our office is coordinating a change from humira to cimzia with goal of getting her on the newer biologic in the next few weeks as an outpatient.    I am around all week, please call if any further questions or concerns.  For now will sign off.   DOwens Loffler MD [facility] Pager [phone]

## 2017-03-30 NOTE — Consult Note (Signed)
[place] Nurse ostomy follow up Stoma type/location: RLQ colostomy  Pouch change performed.  Sister performs this at home and is present today.  Stomal assessment/size: 7/8"  Peristomal assessment: well budded stoma, skin intact  Using 1 piece convex with barrier ring Treatment options for stomal/peristomal skin: barrier ring Output soft brown stool Ostomy pouching: 1pc. Convex with barrier ring Education provided: Pouch change performed. Sister is independent with this.  Enrolled patient in [place] Discharge program:No  [place] Nurse wound follow up Wound type: Surgical wound Measurement: 12 cm x 1 cm x 1.3 cm  Wound TTC:NGFRE red Drainage (amount, consistency, odor) minimal serosanguinous drainage Periwound:intact  RLQ colostomy present Dressing procedure/placement/frequency: Saline moist kerlix to wound bed. Cover with ABD pad and tape.  Sister at bedside.  Observed and will be performing at home.  Will not follow at this time.  Please re-consult if needed.  Domenic Moras RN BSN [place] Pager [phone]

## 2017-03-30 NOTE — Care Management Important Message (Signed)
Important Message  Patient Details  Name: Sara Barnes MRN: [id number] Date of Birth: Jun 25, 1940   Medicare Important Message Given:  Yes    Kerin Salen 03/30/2017, 10:58 AMImportant Message  Patient Details  Name: Sara Barnes MRN: [id number] Date of Birth: 04/03/1941   Medicare Important Message Given:  Yes    Kerin Salen 03/30/2017, 10:58 AM

## 2017-03-30 NOTE — Progress Notes (Signed)
Discharge planning, spoke with patient and sister at bedside. Have chosen [facility] for [facility] for wound and ostomy care. [facility]ed [facility] for referral. Needs 3n1, contacted [facility] to deliver to room. [phone]

## 2017-03-30 NOTE — Discharge Summary (Signed)
Physician Discharge Summary  Patient ID: Sara Barnes MRN: [id number] DOB/AGE: 1940/10/15 76 y.o.  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 Johnathan Hausen.  Severe Crohn's disease:   Cimzia/prednisone at home -  History of atrial fibrillation History of breast cancer GERD  Discharge Diagnoses:   Same   Principal Problem:   Perforated viscus Active Problems:   Crohn disease ([place])   S/P laparoscopy   PROCEDURES: Diagnostic laparoscopy, exploratory laparotomy and right colectomy, 03/24/17 Dr. Lenice Llamas Course:  This is a 76 year old female with severe colonic Crohn's disease with anorectal manifestations (fistula). She was on Humira but stopped that 3 weeks ago. She has also been on prednisone. She's had chronic right lower quadrant pain, rectal bleeding, nausea for the past 6 weeks by her report. She underwent a colonoscopy midafternoon today. Following that she had increased rectal bleeding. During the colonoscopy severe inflammatory changes of the rectum sigmoid colon and descending colon were seen. The colonoscopy was stopped at the level of the descending colon because of the severe inflammatory changes. Some cold biopsies were taken of the mucosa of the descending colon. Because there was progression of her disease and she had increased rectal bleeding on contact with the scope, she was sent to [facility] emergency department. The plan was that she was to be admitted for high-dose IV steroids. She was complaining of her usual chronic right lower quadrant pain that may be a little worse and so acute abdominal x-ray series was performed. This suggested a pneumoperitoneum on the left lateral decubitus view. She was started on broad-spectrum antibiotics. Steroids were not started. I was asked to see  her. CT scan demonstrated a large amount of air around the cecum with a suggestion of a defect in the cecum. They are tracks up the right side above the liver and up toward the mediastinum.  She was taken to the OR and underwent the procedure above.  She tolerated it well.  She has some post op ileus, but made good progress.  WBC returned to normal and antibiotics were discontinued on the 7th day.  She is healing nicely, tolerating diet and ready for discharge today.  CBC Latest Ref Rng & Units 03/27/2017 03/26/2017 03/24/2017  WBC 4.0 - 10.5 K/uL 7.1 11.8(H) 16.4(H)  Hemoglobin 12.0 - 15.0 g/dL 10.3(L) 11.5(L) 13.5  Hematocrit 36.0 - 46.0 % 32.6(L) 36.9 42.4  Platelets 150 - 400 K/uL 187 213 255   CMP Latest Ref Rng & Units 03/27/2017 03/26/2017 03/25/2017  Glucose 65 - 99 mg/dL 100(H) 101(H) 135(H)  BUN 6 - 20 mg/dL 9 12 11   Creatinine 0.44 - 1.00 mg/dL 0.81 0.99 1.35(H)  Sodium 135 - 145 mmol/L 144 144 144  Potassium 3.5 - 5.1 mmol/L 4.0 3.7 4.2  Chloride 101 - 111 mmol/L 119(H) 118(H) 116(H)  CO2 22 - 32 mmol/L 21(L) 22 21(L)  Calcium 8.9 - 10.3 mg/dL 8.2(L) 8.4(L) 8.4(L)  Total Protein 6.5 - 8.1 g/dL - - -  Total Bilirubin 0.3 - 1.2 mg/dL - - -  Alkaline Phos 38 - 126 U/L - - -  AST 15 - 41 U/L - - -  ALT 14 - 54 U/L - - -   Colon, segmental resection, right colon - CHRONIC ACTIVE INFLAMMATION CONSISTENT WITH INFLAMMATORY BOWEL DISEASE. - NO MALIGNANCY IDENTIFIED  Condition on DC:  Improved    Disposition:  01-Home or Self Care   Allergies as of 03/31/2017      Reactions   Codeine Nausea And Vomiting   Mesalamine Nausea And Vomiting   Other Itching   PERFUMED LOTIONS Cant have MRI due to ear implant    Oxycodone-acetaminophen Rash   Itching & rash Tolerates Dilaudid   Sulfa Antibiotics Rash      Medication List    TAKE these medications   acetaminophen 500 MG tablet Commonly known as:  TYLENOL You can take 2 tablets every 6 hours as needed for pain.  Use this  as your primary pain control.  Use Tramadol as a last resort.  You can buy this over the counter at any drug store.    DO NOT TAKE MORE THAN 4000 MG OF TYLENOL PER DAY.  IT CAN HARM YOUR LIVER.   Certolizumab Pegol 2 X 200 MG/ML Kit Commonly known as:  CIMZIA PREFILLED Talk with Dr. Hilarie Fredrickson and he will tell you when to resume this medicine. What changed:    how much to take  how to take this  when to take this  additional instructions  Another medication with the same name was removed. Continue taking this medication, and follow the directions you see here.   famotidine 40 MG tablet Commonly known as:  PEPCID Take 1 tablet (40 mg total) by mouth 2 (two) times daily. What changed:    when to take this  reasons to take this   FLUoxetine 20 MG capsule Commonly known as:  PROZAC TAKE 1 CAPSULE (20 MG TOTAL) BY MOUTH 2 (TWO) TIMES DAILY.   ondansetron 4 MG disintegrating tablet Commonly known as:  ZOFRAN ODT Take 1 tablet (4 mg total) by mouth every 6 (six) hours as needed for nausea or vomiting.   predniSONE 5 MG tablet Commonly known as:  DELTASONE Take 5 mg by mouth daily.   simvastatin 20 MG tablet Commonly known as:  ZOCOR Take 1 tablet (20 mg total) by mouth daily.   traMADol 50 MG tablet Commonly known as:  ULTRAM Take 1-2 tablets (50-100 mg total) by mouth every 6 (six) hours as needed for moderate pain or severe pain (pain not relieved by Tylenol).   VITAMIN B-12 IJ Inject 1 mL as directed every 30 (thirty) days.   zolpidem 10 MG tablet Commonly known as:  AMBIEN TAKE 1/2 TO 1 TABLET BY MOUTH EVERY NIGHT AS NEEDED FOR SLEEP            Durable Medical Equipment  (From admission, onward)        Start     Ordered   03/30/17 1130  For home use only DME 3 n 1  Once     03/30/17 1129     Follow-up Information    Roma Schanz R, DO Follow up.   Specialty:  Family Medicine Why:  Call and follow up for Medical issues. Contact  information: [address] [place] [address] [phone]        [facility] [place] Follow up on 04/15/2017.   Specialty:  General Surgery Why:  You will see Dr. Hassell Done at 9:35 AM.  Be at the office 30 minutes early for check in.  Bring photo ID and insurance information.   Contact information: [address] [place] [address] [phone]        Jerene Bears, MD Follow up.   Specialty:  Gastroenterology Why:  CAll for follow up with  his office. Contact information: [address] [place] [address] [phone]        [facility] Follow up.   Specialty:  Home Health Services Why:  nursed to assist with wound and ostomy care Contact information: [address] [place] [address] [place] Follow up.   Why:  bedside commode Contact information: [address] [place] [address] [phone]           Signed: Earnstine Regal 03/31/2017, 10:51 AM

## 2017-03-30 NOTE — Discharge Instructions (Signed)
[facility], [place] [phone]  OPEN ABDOMINAL SURGERY: POST OP INSTRUCTIONS  Always review your discharge instruction sheet given to you by the facility where your surgery was performed.  IF YOU HAVE DISABILITY OR FAMILY LEAVE FORMS, YOU MUST BRING THEM TO THE OFFICE FOR PROCESSING.  PLEASE DO NOT GIVE THEM TO YOUR DOCTOR.  1. A prescription for pain medication may be given to you upon discharge.  Take your pain medication as prescribed, if needed.  If narcotic pain medicine is not needed, then you may take acetaminophen (Tylenol) or ibuprofen (Advil) as needed. 2. Take your usually prescribed medications unless otherwise directed. 3. If you need a refill on your pain medication, please contact your pharmacy. They will contact our office to request authorization.  Prescriptions will not be filled after 5pm or on week-ends. 4. You should follow a light diet the first few days after arrival home, such as soup and crackers, pudding, etc.unless your doctor has advised otherwise. A high-fiber, low fat diet can be resumed as tolerated.   Be sure to include lots of fluids daily. Most patients will experience some swelling and bruising on the chest and neck area.  Ice packs will help.  Swelling and bruising can take several days to resolve 5. Most patients will experience some swelling and bruising in the area of the incision. Ice pack will help. Swelling and bruising can take several days to resolve..  6. It is common to experience some constipation if taking pain medication after surgery.  Increasing fluid intake and taking a stool softener will usually help or prevent this problem from occurring.  A mild laxative (Milk of Magnesia or Miralax) should be taken according to package directions if there are no bowel movements after 48 hours. 7.  You may have steri-strips (small skin tapes) in place directly over the incision.  These strips should be left on the skin for 7-10 days.  If your  surgeon used skin glue on the incision, you may shower in 24 hours.  The glue will flake off over the next 2-3 weeks.  Any sutures or staples will be removed at the office during your follow-up visit. You may find that a light gauze bandage over your incision may keep your staples from being rubbed or pulled. You may shower and replace the bandage daily. 8. ACTIVITIES:  You may resume regular (light) daily activities beginning the next day--such as daily self-care, walking, climbing stairs--gradually increasing activities as tolerated.  You may have sexual intercourse when it is comfortable.  Refrain from any heavy lifting or straining until approved by your doctor. a. You may drive when you no longer are taking prescription pain medication, you can comfortably wear a seatbelt, and you can safely maneuver your car and apply brakes b. Return to Work: ___________________________________ 40. You should see your doctor in the office for a follow-up appointment approximately two weeks after your surgery.  Make sure that you call for this appointment within a day or two after you arrive home to insure a convenient appointment time. OTHER INSTRUCTIONS:  _____________________________________________________________ _____________________________________________________________  WHEN TO CALL YOUR DOCTOR: 1. Fever over 101.0 2. Inability to urinate 3. Nausea and/or vomiting 4. Extreme swelling or bruising 5. Continued bleeding from incision. 6. Increased pain, redness, or drainage from the incision. 7. Difficulty swallowing or breathing 8. Muscle cramping or spasms. 9. Numbness or tingling in hands or feet or around lips.  The clinic staff is available to  answer your questions during regular business hours.  Please dont hesitate to call and ask to speak to one of the nurses if you have concerns.  For further questions, please visit [email or web address]  Mechanical Wound Debridement Remove  current gauze, wet a new gauze 4 x 4 with sterile saline are repack into the open wound.  Place a dry sterile dressing over this.  Keep the site clean and dry, do the dressing change twice a day. Mechanical wound debridement is a treatment to remove dead tissue from a wound. This helps the wound heal. The treatment involves cleaning the wound (irrigation) and using a pad or gauze (dressing) to remove dead tissue and debris from the wound. There are different types of mechanical wound debridement. Depending on the wound, you may need to repeat this procedure or change to another form of debridement as your wound starts to heal. Tell a health care provider about:  Any allergies you have.  All medicines you are taking, including vitamins, herbs, eye drops, creams, and over-the-counter medicines.  Any blood disorders you have.  Any medical conditions you have, including any conditions that: ? Cause a significant decrease in blood circulation to the part of the body where the wound is, such as peripheral vascular disease. ? Compromise your defense (immune) system or white blood count.  Any surgeries you have had.  Whether you are pregnant or may be pregnant. What are the risks? Generally, this is a safe procedure. However, problems may occur, including:  Infection.  Bleeding.  Damage to healthy tissue in and around your wound.  Soreness or pain.  Failure of the wound to heal.  Scarring.  What happens before the procedure? You may be given antibiotic medicine to help prevent infection. What happens during the procedure?  Your health care provider may apply a numbing medicine (topical anesthetic) to the wound.  Your health care provider will irrigate your wound with a germ-free (sterile), salt-water (saline) solution. This removes debris, bacteria, and dead tissue.  Depending on what type of mechanical wound debridement you are having, your health care provider may do one of the  following: ? Put a dressing on your wound. You may have dry gauze pad placed into the wound. Your health care provider will remove the gauze after the wound is dry. Any dead tissue and debris that has dried into the gauze will be lifted out of the wound (wet-to-dry debridement). ? Use a type of pad (monofilament fiber debridement pad). This pad has a fluffy surface on one side that picks up dead tissue and debris from your wound. Your health care provider wets the pad and wipes it over your wound for several minutes. ? Irrigate your wound with a pressurized stream of solution such as saline or water.  Once your health care provider is finished, he or she may apply a light dressing to your wound. The procedure may vary among health care providers and hospitals. What happens after the procedure?  You may receive medicine for pain.  You will continue to receive antibiotic medicine if it was started before your procedure. This information is not intended to replace advice given to you by your health care provider. Make sure you discuss any questions you have with your health care provider. Document Released: 01/03/2015 Document Revised: 09/20/2015 Document Reviewed: 08/23/2014 Elsevier Interactive Patient Education  2018 [employer] An ileostomy is a surgical procedure to make an opening (stoma) for stool to leave your body.  The surgery is done when a medical condition prevents stool from passing through the intestines and leaving your body through the rectum. During the surgery, a part of the small intestine (ileum) is attached to the stoma made in the abdominal wall. A bag or pouch is fitted over the stoma. Stool and gas will collect in the pouch. After having this surgery, you will need to empty and change your ileostomy pouch as needed. You will also need to take steps to care for the stoma. How do I care for my stoma? Your stoma should look pink and moist. At first,  the stoma may be swollen, but this swelling will go away within 6 weeks. To care for the stoma:  Keep the skin around the stoma clean and dry.  Use a clean, soft washcloth to gently wash the stoma and the skin around it.  Use stoma powder or ointment on your skin only as told by your health care provider.  If your skin becomes irritated, change your ileostomy pouch. The irritation may indicate that the pouch is leaking.  Measure the stoma opening regularly and record the size. Watch for changes. Share the information with your health care provider.  Check your stoma area every day for signs of infection. Check for: ? More redness, swelling, or pain. ? More fluid or blood. ? Pus or warmth.  How do I care for my ileostomy pouch? The pouch that fits over the stoma can have either one or two pieces.  One-piece pouch: The skin barrier and the pouch are combined in one unit.  Two-piece pouch: The skin barrier and the pouch are separate pieces that attach to each other.  Empty your pouch when it is one-third to one-half full. Do not let more stool or gas build up. This could cause the pouch to leak. Some pouches have a built-in gas release valve. Change your pouch every 3-4 days for the first 6 weeks, and then every 5-7 days. You should also change the pouch right away if your skin near the stoma looks irritated. How do I empty my ileostomy pouch? You will be taught how to empty your pouch before you leave the hospital. Basic steps include: 1. Wash your hands with soap and water. 2. Sit far back on the toilet. 3. Put pieces of toilet paper into the toilet water. This will prevent splashing as you empty the stool into the toilet bowl. 4. Unclip the tail end of the pouch or separate the hook-and-loop fastener at the end of the pouch. 5. Unroll the tail and empty stool into the toilet. 6. Clean the tail with toilet paper. 7. Reroll the tail, and clip it closed or press the hook-and-loop  fastener pieces together. 8. Wash your hands again.  How do I change my ileostomy pouch? You will be taught how to change the pouch before you leave the hospital. Basic steps include: 1. Lay out your supplies. 2. Wash your hands with soap and water. 3. Carefully remove the old pouch. 4. Wash the stoma and the skin around the stoma. Allow the area to dry. Men may be told to carefully shave any hair around the stoma. 5. Use the stoma measuring guide that comes with your pouch set to decide what size hole you will need to cut in the skin barrier piece. Choose the smallest possible size that will hold the stoma but will not touch it. 6. Use the guide to trace the circle on the back of the  skin barrier piece. 7. Cut out the hole. 8. Hold the skin barrier piece over the stoma to make sure the hole is the correct size. 9. Remove the adhesive paper backing from the skin barrier piece. 10. Squeeze stoma paste around the opening of the skin barrier piece. 11. Clean and dry the skin around the stoma again. 12. Carefully fit the skin barrier piece over your stoma. 13. If you are using a two-piece pouch, snap the pouch onto the skin barrier piece. 14. Close the tail of the pouch. 15. Put your hand over the top of the skin barrier piece to help warm it for about 5 minutes. This helps it conform to your body better. 37. Wash your hands again.  What are some general tips?  Avoid wearing clothes that are tight directly over your stoma.  You can shower with or without the pouch in place. Do not use harsh or oily soaps or lotions. Always keep the pouch on if you are taking a bath or swimming.  If your pouch gets wet, you can dry it with a hair dryer on the cool setting.  Whenever you leave home, take an extra skin barrier and pouch with you.  Store all supplies in a cool, dry place. Do not leave supplies in extreme heat because parts can melt.  To prevent odor, you can put drops of ostomy deodorizer  in the pouch. Your health care provider may also recommend putting ostomy lubricant inside the pouch. This helps the stool to slide out of the pouch more easily and completely.  Return to your normal activities as told by your health care provider. Ask your health care provider what activities are safe for you. Contact a health care provider if:  You have more redness, swelling, or pain around your stoma.  You have more fluid or blood coming from your stoma.  Your stoma feels warm to the touch.  You have pus coming from your stoma.  Your stoma extends in or out farther than normal.  Your stoma becomes purple, black, or pale white.  You need to change the pouch every day.  You have a fever.  You have abdominal pain, nausea, or bloating.  No stool is passing from the stoma.  You have diarrhea, requiring you to empty the pouch more frequently than normal. Get help right away if:  Your stool is bloody.  You vomit.  You have trouble breathing.  You feel dizzy or light-headed. This information is not intended to replace advice given to you by your health care provider. Make sure you discuss any questions you have with your health care provider. Document Released: 04/17/2003 Document Revised: 05/11/2015 Document Reviewed: 12/26/2014 Elsevier Interactive Patient Education  2017 [employer].

## 2017-03-30 NOTE — Progress Notes (Signed)
7 Days Post-Op    CC:  Abdominal pain  Subjective: Doing well, and ready for soft diet.    Objective: Vital signs in last 24 hours: Temp:  [98.2 F (36.8 C)-98.4 F (36.9 C)] 98.4 F (36.9 C) (12/03 0624) Pulse Rate:  [67-69] 68 (12/03 0624) Resp:  [16] 16 (12/03 0624) BP: (115-135)/(65-76) 135/76 (12/03 0624) SpO2:  [97 %-98 %] 98 % (12/03 0624) Last BM Date: 03/30/17 1080 PO 1500 IV 300 urine recorded 1150 from ileostomy Afebrile, VSS WBC is back to normal and BMP OK   Intake/Output from previous day: 12/02 0701 - 12/03 0700 In: 2595 [P.O.:1080; I.V.:1200; IV Piggyback:315] Out: 1450 [Urine:300; Stool:1150] Intake/Output this shift: No intake/output data recorded.  General appearance: alert, cooperative and no distress Resp: clear to auscultation bilaterally GI: soft, + BS, ostomy working well, open site looks fine.  Tolerating full liquids.  Lab Results:  No results for input(s): WBC, HGB, HCT, PLT in the last 72 hours.  BMET No results for input(s): NA, K, CL, CO2, GLUCOSE, BUN, CREATININE, CALCIUM in the last 72 hours. PT/INR No results for input(s): LABPROT, INR in the last 72 hours.  Recent Labs  Lab 03/23/17 2003  AST 23  ALT 16  ALKPHOS 85  BILITOT 1.0  PROT 6.7  ALBUMIN 3.5     Lipase     Component Value Date/Time   LIPASE 21 06/17/2016 0158     Medications: . enoxaparin (LOVENOX) injection  40 mg Subcutaneous Q24H  . FLUoxetine  20 mg Oral Daily  . methylPREDNISolone (SOLU-MEDROL) injection  5.2 mg Intravenous Q24H  . pantoprazole (PROTONIX) IV  40 mg Intravenous Q12H   . dextrose 5 % and 0.9 % NaCl with KCl 40 mEq/L 50 mL/hr at 03/29/17 1625  . methocarbamol (ROBAXIN)  IV 500 mg (03/30/17 0622)  . piperacillin-tazobactam (ZOSYN)  IV Stopped (03/30/17 4008)   Anti-infectives (From admission, onward)   Start     Dose/Rate Route Frequency Ordered Stop   03/24/17 0230  piperacillin-tazobactam (ZOSYN) IVPB 3.375 g     3.375 g 12.5  mL/hr over 240 Minutes Intravenous Every 8 hours 03/24/17 0217     03/23/17 2030  vancomycin (VANCOCIN) 1,500 mg in sodium chloride 0.9 % 500 mL IVPB     1,500 mg 250 mL/hr over 120 Minutes Intravenous  Once 03/23/17 1932 03/23/17 2325   03/23/17 1930  piperacillin-tazobactam (ZOSYN) IVPB 3.375 g     3.375 g 100 mL/hr over 30 Minutes Intravenous  Once 03/23/17 1917 03/23/17 2115      Assessment/Plan Perforated cecum after colonoscopy, 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 Johnathan Hausen.   On Cimzia/prednisone at home - Solumedrol IV  post op - need to convert to PO predinsone today, will review with DR. Byerly  Diagnostic laparoscopy, exploratory laparotomy and right colectomy, 03/24/17 Dr. Jackolyn Confer  POD 6  History of atrial fibrillation History of breast cancer GERD  FEN:IV fluids/full liquids QP:[id number] 11/20 6/18 x 1; Zosyn 11/26 =>>day 7  Today, will stop after last dose today TIW:[id number] Foley: Out Follow up: Johnathan Hausen MD     Plan;  Soft diet, set up home health for her, teach family how to do dressing changes.  They are doing ostomy care at home.  Stop abx after last dose today that will give her 7 days, WBC is normal. Convert her IV steroids to PO, I have ask GI to do that today.  If no issues  send home tomorrow.  Follow up with Dr. Hassell Done.    LOS: 7 days    Sara Barnes 03/30/2017 [phone]

## 2017-03-31 ENCOUNTER — Other Ambulatory Visit: Payer: Self-pay | Admitting: Internal Medicine

## 2017-03-31 DIAGNOSIS — E538 Deficiency of other specified B group vitamins: Secondary | ICD-10-CM

## 2017-03-31 DIAGNOSIS — K6289 Other specified diseases of anus and rectum: Secondary | ICD-10-CM

## 2017-03-31 DIAGNOSIS — K51911 Ulcerative colitis, unspecified with rectal bleeding: Secondary | ICD-10-CM

## 2017-03-31 DIAGNOSIS — K50113 Crohn's disease of large intestine with fistula: Secondary | ICD-10-CM

## 2017-03-31 MED ORDER — TRAMADOL HCL 50 MG PO TABS
50.0000 mg | ORAL_TABLET | Freq: Four times a day (QID) | ORAL | Status: DC | PRN
  Administered 2017-03-31 (×2): 50 mg via ORAL
  Filled 2017-03-31: qty 1

## 2017-03-31 MED ORDER — HYDROMORPHONE HCL 1 MG/ML IJ SOLN: 1.0000 mg | INTRAMUSCULAR | Status: DC | PRN

## 2017-03-31 MED ORDER — ACETAMINOPHEN 500 MG PO TABS: ORAL_TABLET | ORAL | 0 refills | Status: DC

## 2017-03-31 MED ORDER — TRAMADOL HCL 50 MG PO TABS: 50.0000 mg | ORAL_TABLET | Freq: Four times a day (QID) | ORAL | 0 refills | Status: DC | PRN

## 2017-03-31 MED ORDER — CERTOLIZUMAB PEGOL 2 X 200 MG/ML ~~LOC~~ KIT: PACK | SUBCUTANEOUS | 11 refills | Status: DC

## 2017-03-31 NOTE — Progress Notes (Signed)
Pt was discharged home today. Instructions were reviewed with patient, prescription was verified and given to patient, questions were answered. Pt was taken to main entrance via wheelchair by NT.

## 2017-03-31 NOTE — Progress Notes (Signed)
8 Days Post-Op    HF:[id number] pain  Subjective: Pt doing well, wound looks good.  She has decided she is OK with home dressing changes, instead of wound vac.  Not eating allot, says she isn't hungry now that we have given her food.  Ostomy working well.  Objective: Vital signs in last 24 hours: Temp:  [97.9 F (36.6 C)-98.4 F (36.9 C)] 97.9 F (36.6 C) (12/04 0517) Pulse Rate:  [62-71] 62 (12/04 0517) Resp:  [16-18] 18 (12/04 0517) BP: (110-144)/(62-79) 144/78 (12/04 0517) SpO2:  [97 %-98 %] 98 % (12/04 0517) Last BM Date: 03/30/17 60 PO recorded Urine x 9 Stool 575 Afebrile, VSS No labs Intake/Output from previous day: 12/03 0701 - 12/04 0700 In: 160 [P.O.:60; IV Piggyback:100] Out: 575 [Stool:575] Intake/Output this shift: Total I/O In: -  Out: 50 [Stool:50]  General appearance: alert, cooperative and no distress Resp: clear to auscultation bilaterally GI: soft, sites looks fine ostomy woring well, no nausea with PO's.  Lab Results:  No results for input(s): WBC, HGB, HCT, PLT in the last 72 hours.  BMET No results for input(s): NA, K, CL, CO2, GLUCOSE, BUN, CREATININE, CALCIUM in the last 72 hours. PT/INR No results for input(s): LABPROT, INR in the last 72 hours.  No results for input(s): AST, ALT, ALKPHOS, BILITOT, PROT, ALBUMIN in the last 168 hours.   Lipase     Component Value Date/Time   LIPASE 21 06/17/2016 0158     Medications: . acetaminophen  1,000 mg Oral Q8H  . enoxaparin (LOVENOX) injection  40 mg Subcutaneous Q24H  . FLUoxetine  20 mg Oral Daily  . pantoprazole  40 mg Oral BID  . predniSONE  5 mg Oral Q breakfast    Assessment/Plan Perforated cecum after colonoscopy, 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 Johnathan Hausen.   On Cimzia/prednisone at home - Solumedrol IV  post op - need to convert to PO predinsone today, will review with DR. Byerly  Diagnostic laparoscopy,  exploratory laparotomy and right colectomy, 03/24/17 Dr. Rene Kocher 7  History of atrial fibrillation History of breast cancer GERD  FEN:IV fluids/soft diet MC:EYEMVVKPQA 11/20 6/18 x 1; Zosyn 11/26 =>>03/30/17 ESL:PNPYYFR Foley: Out Follow up: Johnathan Hausen MD   Plan:  Home today.  I have personally reviewed the patients medication history on the [place] controlled substance database.       LOS: 8 days    Genesys Coggeshall 03/31/2017 [phone]

## 2017-03-31 NOTE — Consult Note (Signed)
[facility] Nurse ostomy follow up Patient requires smaller pouches as new ostomy is significantly smaller than her previous stoma.  [facility] contacted and will sample with 4, 1-inch soft convex pouches, 4 rings, a belt and ostomy powder. Enrolled patient in [facility] Discharge program: Yes, today.   [facility] nursing team will follow, and will remain available to this patient, the nursing and medical teams.  Thanks, Maudie Flakes, MSN, RN, [place], Arther Abbott  Pager# [phone]

## 2017-04-01 ENCOUNTER — Telehealth: Payer: Self-pay

## 2017-04-01 NOTE — Telephone Encounter (Signed)
Per Dr Hilarie Fredrickson verbal orders, he is okay with patient continuing to have #30 ambien monthly if needed.

## 2017-04-01 NOTE — Telephone Encounter (Addendum)
04/01/17  Transition Care Management Follow-up Telephone Call  ADMISSION DATE: 03/23/17  DISCHARGE DATE: 03/31/17   How have you been since you were released from the hospital? Per patients sister patient has has some pain ans weakness but was given medication for pain.   Do you understand why you were in the hospital? Yes per sisiter   Do you understand the discharge instrcutions? Yes per sister    Items Reviewed:  Medications reviewed: Yes, patient has not started Cemzia,or Zocor yet.    Allergies reviewed: Yes   Dietary changes reviewed:Regular diet per patients sister   Referrals reviewed: Appointment scheduled for hospital follow up. Patient alredy had appointment for labwork same day.   Functional Questionnaire:   Activities of Daily Living (ADLs): Needs assistance at this time with all her ADL's  Any patient concerns? None at this time per patients sister/caregiver.   Confirmed importance and date/time of follow-up visits scheduled:Yes   Confirmed with patient if condition begins to worsen call PCP or go to the ER. Yes    Patient was given the office number and encouragred to call back with questions or concerns. Yes

## 2017-04-02 ENCOUNTER — Telehealth: Payer: Self-pay | Admitting: Family Medicine

## 2017-04-02 NOTE — Telephone Encounter (Signed)
[facility] nurse from [facility] called in to make provider aware that pt just got out the hospital and that they received orders for home health. They will be going out to the pt's home starting tomorrow.    CB if needed: [phone]

## 2017-04-02 NOTE — Telephone Encounter (Signed)
noted 

## 2017-04-03 DIAGNOSIS — Z932 Ileostomy status: Secondary | ICD-10-CM | POA: Diagnosis not present

## 2017-04-03 DIAGNOSIS — Z48815 Encounter for surgical aftercare following surgery on the digestive system: Secondary | ICD-10-CM | POA: Diagnosis not present

## 2017-04-03 DIAGNOSIS — K5090 Crohn's disease, unspecified, without complications: Secondary | ICD-10-CM | POA: Diagnosis not present

## 2017-04-03 DIAGNOSIS — I5032 Chronic diastolic (congestive) heart failure: Secondary | ICD-10-CM | POA: Diagnosis not present

## 2017-04-03 DIAGNOSIS — F341 Dysthymic disorder: Secondary | ICD-10-CM | POA: Diagnosis not present

## 2017-04-03 DIAGNOSIS — I4891 Unspecified atrial fibrillation: Secondary | ICD-10-CM | POA: Diagnosis not present

## 2017-04-04 DIAGNOSIS — K5090 Crohn's disease, unspecified, without complications: Secondary | ICD-10-CM | POA: Diagnosis not present

## 2017-04-04 DIAGNOSIS — I5032 Chronic diastolic (congestive) heart failure: Secondary | ICD-10-CM | POA: Diagnosis not present

## 2017-04-04 DIAGNOSIS — Z932 Ileostomy status: Secondary | ICD-10-CM | POA: Diagnosis not present

## 2017-04-04 DIAGNOSIS — F341 Dysthymic disorder: Secondary | ICD-10-CM | POA: Diagnosis not present

## 2017-04-04 DIAGNOSIS — I4891 Unspecified atrial fibrillation: Secondary | ICD-10-CM | POA: Diagnosis not present

## 2017-04-04 DIAGNOSIS — Z48815 Encounter for surgical aftercare following surgery on the digestive system: Secondary | ICD-10-CM | POA: Diagnosis not present

## 2017-04-08 ENCOUNTER — Other Ambulatory Visit: Payer: Self-pay | Admitting: Internal Medicine

## 2017-04-09 DIAGNOSIS — Z932 Ileostomy status: Secondary | ICD-10-CM | POA: Diagnosis not present

## 2017-04-09 DIAGNOSIS — I4891 Unspecified atrial fibrillation: Secondary | ICD-10-CM | POA: Diagnosis not present

## 2017-04-09 DIAGNOSIS — Z48815 Encounter for surgical aftercare following surgery on the digestive system: Secondary | ICD-10-CM | POA: Diagnosis not present

## 2017-04-09 DIAGNOSIS — I5032 Chronic diastolic (congestive) heart failure: Secondary | ICD-10-CM | POA: Diagnosis not present

## 2017-04-09 DIAGNOSIS — K5090 Crohn's disease, unspecified, without complications: Secondary | ICD-10-CM | POA: Diagnosis not present

## 2017-04-09 DIAGNOSIS — F341 Dysthymic disorder: Secondary | ICD-10-CM | POA: Diagnosis not present

## 2017-04-10 DIAGNOSIS — F341 Dysthymic disorder: Secondary | ICD-10-CM | POA: Diagnosis not present

## 2017-04-10 DIAGNOSIS — I4891 Unspecified atrial fibrillation: Secondary | ICD-10-CM | POA: Diagnosis not present

## 2017-04-10 DIAGNOSIS — I5032 Chronic diastolic (congestive) heart failure: Secondary | ICD-10-CM | POA: Diagnosis not present

## 2017-04-10 DIAGNOSIS — K5090 Crohn's disease, unspecified, without complications: Secondary | ICD-10-CM | POA: Diagnosis not present

## 2017-04-10 DIAGNOSIS — Z932 Ileostomy status: Secondary | ICD-10-CM | POA: Diagnosis not present

## 2017-04-10 DIAGNOSIS — Z48815 Encounter for surgical aftercare following surgery on the digestive system: Secondary | ICD-10-CM | POA: Diagnosis not present

## 2017-04-13 ENCOUNTER — Encounter: Payer: Self-pay | Admitting: Medical

## 2017-04-13 ENCOUNTER — Other Ambulatory Visit: Payer: Medicare HMO

## 2017-04-13 ENCOUNTER — Ambulatory Visit: Payer: Medicare HMO | Admitting: Medical

## 2017-04-13 VITALS — BP 126/71 | HR 78 | Temp 97.9°F | Resp 16 | Ht 66.0 in | Wt 145.2 lb

## 2017-04-13 DIAGNOSIS — D649 Anemia, unspecified: Secondary | ICD-10-CM | POA: Diagnosis not present

## 2017-04-13 DIAGNOSIS — K631 Perforation of intestine (nontraumatic): Secondary | ICD-10-CM

## 2017-04-13 DIAGNOSIS — R5383 Other fatigue: Secondary | ICD-10-CM | POA: Diagnosis not present

## 2017-04-13 LAB — CBC WITH DIFFERENTIAL/PLATELET
BASOS PCT: 0.8 % (ref 0.0–3.0)
Basophils Absolute: 0.1 10*3/uL (ref 0.0–0.1)
EOS ABS: 0.1 10*3/uL (ref 0.0–0.7)
Eosinophils Relative: 1.3 % (ref 0.0–5.0)
HCT: 41.2 % (ref 36.0–46.0)
Hemoglobin: 13.6 g/dL (ref 12.0–15.0)
LYMPHS ABS: 2.1 10*3/uL (ref 0.7–4.0)
Lymphocytes Relative: 22.8 % (ref 12.0–46.0)
MCHC: 33.1 g/dL (ref 30.0–36.0)
MCV: 98.6 fl (ref 78.0–100.0)
MONO ABS: 0.8 10*3/uL (ref 0.1–1.0)
Monocytes Relative: 8.8 % (ref 3.0–12.0)
NEUTROS ABS: 6 10*3/uL (ref 1.4–7.7)
NEUTROS PCT: 66.3 % (ref 43.0–77.0)
PLATELETS: 368 10*3/uL (ref 150.0–400.0)
RBC: 4.18 Mil/uL (ref 3.87–5.11)
RDW: 12.3 % (ref 11.5–15.5)
WBC: 9.1 10*3/uL (ref 4.0–10.5)

## 2017-04-13 LAB — COMPREHENSIVE METABOLIC PANEL
ALT: 19 U/L (ref 0–35)
AST: 20 U/L (ref 0–37)
Albumin: 3.9 g/dL (ref 3.5–5.2)
Alkaline Phosphatase: 102 U/L (ref 39–117)
BUN: 9 mg/dL (ref 6–23)
CALCIUM: 9.6 mg/dL (ref 8.4–10.5)
CHLORIDE: 102 meq/L (ref 96–112)
CO2: 26 meq/L (ref 19–32)
CREATININE: 0.94 mg/dL (ref 0.40–1.20)
GFR: 61.62 mL/min (ref >60.00)
Glucose, Bld: 92 mg/dL (ref 70–99)
Potassium: 3.8 mEq/L (ref 3.5–5.1)
SODIUM: 135 meq/L (ref 135–145)
Total Bilirubin: 0.5 mg/dL (ref 0.2–1.2)
Total Protein: 7.2 g/dL (ref 6.0–8.3)

## 2017-04-13 NOTE — Progress Notes (Signed)
Subjective:    Patient ID: Sara Barnes, female    DOB: Jan 29, 1941, 76 y.o.   MRN: [id number]  HPI  Pt in for follow up for bowel perforation after colonoscopy. Pt is on humera and was having some abdomen cramps and some bleeding. Pt has known hx of crohn's disease and went in for colonoscopy. She had complication and saw Dr. Zella Richer who repaired the perforation snd now has colostomy.   Pt was discharged on Mar 31, 2017. She states since surgery feels stable. Occasional chills.  Pt reporting some moderate fatigue since surgery. Some progressive improvement with her energy.  Pt has follow up with surgeon this Friday.  Pt has home health coming out twice a week.  Review of Systems  Constitutional: Positive for fatigue. Negative for chills and fever.  HENT: Negative for congestion and drooling.   Respiratory: Negative for cough, chest tightness, shortness of breath and wheezing.   Cardiovascular: Negative for chest pain and palpitations.  Gastrointestinal: Negative for abdominal distention, abdominal pain, anal bleeding, constipation and diarrhea.       Faint mild abdomen discomfort since surgery.  Gradually getting better.  Musculoskeletal: Negative for back pain, joint swelling, neck pain and neck stiffness.  Skin: Negative for rash.  Neurological: Negative for dizziness, speech difficulty, weakness, numbness and headaches.  Hematological: Negative for adenopathy. Does not bruise/bleed easily.  Psychiatric/Behavioral: Negative for behavioral problems, confusion, dysphoric mood and suicidal ideas. The patient is not nervous/anxious.    Past Medical History:  Diagnosis Date  . Allergy   . Anemia   . Basal cell carcinoma   . Blood transfusion without reported diagnosis   . Cancer of lower-inner quadrant of female breast ([facility]) 11/30/2014   left  . Cancer of lower-inner quadrant of left female breast ([facility]) 11/30/2014  . Complication of anesthesia    anxiety post-op after ear surgeries   . Crohn's disease of large intestine with abscess ([facility])   . Dental crowns present    recent root canal 11/2014  . Depression   . Family history of adverse reaction to anesthesia    pt's sister has hx. of post-op N/V  . GERD (gastroesophageal reflux disease)   . Indigestion   . PONV (postoperative nausea and vomiting)   . Runny nose 12/07/2014   clear drainage, per pt.  . Seasonal allergies   . Ulcerative colitis   . Vitamin B12 deficiency      Social History   Socioeconomic History  . Marital status: Widowed    Spouse name: Not on file  . Number of children: 1  . Years of education: Not on file  . Highest education level: Not on file  Social Needs  . Financial resource strain: Not on file  . Food insecurity - worry: Not on file  . Food insecurity - inability: Not on file  . Transportation needs - medical: Not on file  . Transportation needs - non-medical: Not on file  Occupational History  . Occupation: Warden/ranger    Comment: retired at age 75  Tobacco Use  . Smoking status: Never Smoker  . Smokeless tobacco: Never Used  Substance and Sexual Activity  . Alcohol use: Yes    Alcohol/week: 8.4 oz    Types: 14 Glasses of wine per week  . Drug use: No  . Sexual activity: Not Currently    Partners: Male  Other Topics Concern  . Not on file  Social History Narrative   No exercise due to health  Past Surgical History:  Procedure Laterality Date  . ABDOMINAL HYSTERECTOMY     partial  . APPENDECTOMY    . BASAL CELL CARCINOMA EXCISION Left    nose  . BREAST ENHANCEMENT SURGERY    . BREAST LUMPECTOMY Left    age 68's  . BREAST LUMPECTOMY WITH RADIOACTIVE SEED AND SENTINEL LYMPH NODE BIOPSY Left 12/13/2014   Procedure: LEFT BREAST LUMPECTOMY WITH RADIOACTIVE SEED AND SENTINEL LYMPH NODE BIOPSY;  Surgeon: [name], MD;  Location: [facility];  Service: General;  Laterality: Left;  . COLONOSCOPY WITH PROPOFOL  05/28/2011; 06/28/2014  . FLEXIBLE  SIGMOIDOSCOPY N/A 01/08/2016   Procedure: FLEXIBLE SIGMOIDOSCOPY;  Surgeon: [name], MD;  Location: [facility];  Service: Gastroenterology;  Laterality: N/A;  needs MAC due to pain and anxiety  . INCISION AND DRAINAGE ABSCESS  2018  . INCISION AND DRAINAGE PERIRECTAL ABSCESS N/A 09/28/2013   Procedure: IRRIGATION AND DEBRIDEMENT PERIANAL ABSCESS, proctoscopy;  Surgeon: Shann Medal, MD;  Location: [facility];  Service: General;  Laterality: N/A;  . INCISION AND DRAINAGE PERIRECTAL ABSCESS N/A 01/22/2016   Procedure: IRRIGATION AND DEBRIDEMENT PERIRECTAL ABSCESS;  Surgeon: Armandina Gemma, MD;  Location: [facility];  Service: General;  Laterality: N/A;  . INNER EAR SURGERY Bilateral   . LAPAROSCOPIC DIVERTED COLOSTOMY N/A 02/11/2016   Procedure: LAPAROSCOPIC DIVERTED ILEOSTOMY;  Surgeon: Johnathan Hausen, MD;  Location: [facility];  Service: General;  Laterality: N/A;  . LAPAROSCOPY N/A 03/23/2017   Procedure: LAPAROSCOPY DIAGNOSTIC, EXPLORATORY LAPAROTOMY,  BOWEL RESECTION;  Surgeon: Jackolyn Confer, MD;  Location: [facility];  Service: General;  Laterality: N/A;    Family History  Problem Relation Age of Onset  . Prostate cancer Brother 28  . Kidney disease Brother   . Diabetes Brother   . Brain cancer Father   . Rheum arthritis Mother   . Anesthesia problems Sister        post-op N/V  . Emphysema Maternal Grandfather   . Asthma Sister   . Rheum arthritis Brother   . Colon cancer Neg Hx   . Esophageal cancer Neg Hx   . Breast cancer Neg Hx     Allergies  Allergen Reactions  . Codeine Nausea And Vomiting  . Mesalamine Nausea And Vomiting  . Other Itching    PERFUMED LOTIONS  Cant have MRI due to ear implant   . Oxycodone-Acetaminophen Rash    Itching & rash Tolerates Dilaudid  . Sulfa Antibiotics Rash    Current Outpatient Medications on File Prior to Visit  Medication Sig Dispense Refill  . acetaminophen (TYLENOL) 500 MG tablet You can take 2 tablets every 6 hours as needed  for pain.  Use this as your primary pain control.  Use Tramadol as a last resort.  You can buy this over the counter at any drug store.    DO NOT TAKE MORE THAN 4000 MG OF TYLENOL PER DAY.  IT CAN HARM YOUR LIVER. 30 tablet 0  . Certolizumab Pegol (CIMZIA PREFILLED) 2 X 200 MG/ML KIT Talk with Dr. Hilarie Fredrickson and he will tell you when to resume this medicine. 1 kit 11  . cyanocobalamin (,VITAMIN B-12,) 1000 MCG/ML injection INJECT 1 ML INTRAMUSCULARLY EVERY 30 DAYS 5 mL 0  . Cyanocobalamin (VITAMIN B-12 IJ) Inject 1 mL as directed every 30 (thirty) days.    . famotidine (PEPCID) 40 MG tablet Take 1 tablet (40 mg total) by mouth 2 (two) times daily. (Patient taking differently: Take 40 mg by mouth daily  as needed for heartburn or indigestion. )    . FLUoxetine (PROZAC) 20 MG capsule TAKE 1 CAPSULE TWICE DAILY 180 capsule 1  . ondansetron (ZOFRAN ODT) 4 MG disintegrating tablet Take 1 tablet (4 mg total) by mouth every 6 (six) hours as needed for nausea or vomiting. 90 tablet 1  . predniSONE (DELTASONE) 5 MG tablet Take 5 mg by mouth daily.     . traMADol (ULTRAM) 50 MG tablet Take 1-2 tablets (50-100 mg total) by mouth every 6 (six) hours as needed for moderate pain or severe pain (pain not relieved by Tylenol). 40 tablet 0  . zolpidem (AMBIEN) 10 MG tablet TAKE ONE-HALF TABLET BY MOUTH TO 1 AT BEDTIME AS NEEDED FOR SLEEP 30 tablet 2  . simvastatin (ZOCOR) 20 MG tablet Take 1 tablet (20 mg total) by mouth daily. (Patient not taking: Reported on 04/13/2017) 30 tablet 3   No current facility-administered medications on file prior to visit.     BP 126/71   Pulse 78   Temp 97.9 F (36.6 C) (Oral)   Resp 16   Ht 5' 6"  (1.676 m)   Wt 145 lb 3.2 oz (65.9 kg)   SpO2 100%   BMI 23.44 kg/m       Objective:   Physical Exam  General Appearance- Not in acute distress.  HEENT Eyes- Scleraeral/Conjuntiva-bilat- Not Yellow. Mouth & Throat- Normal.  Chest and Lung Exam Auscultation: Breath  sounds:-Normal. Adventitious sounds:- No Adventitious sounds.  Cardiovascular Auscultation:Rythm - Regular. Heart Sounds -Normal heart sounds.  Abdomen Inspection:-Inspection Normal.  Palpation/Perucssion: Palpation and Percussion of the abdomen reveal- Non Tender, No Rebound tenderness, No rigidity(Guarding) and No Palpable abdominal masses.  Liver:-Normal.  Spleen:- Normal.  Upon inspection of the patient's abdomen coloring looks normal around her bandages and her colostomy bag.  Back- no CVA tenderness      Assessment & Plan:  For your recent bowel perforation repair and colostomy, I want you to follow-up with your surgeon this coming Friday.  Currently on exam you appears stable.  I do want to get a CBC and metabolic panel today.  Particularly with a CBC I want to verify that your anemia has improved post surgery.  I will let you know the results of the tests when they are back.  Follow-up is as scheduled with your PCP and if any problems or questions please give [place] a call. Also will review surgeons follow-up notes when they were sent to [place].  Kirstein Baxley, Percell Miller, PA-C

## 2017-04-13 NOTE — Patient Instructions (Signed)
For your recent bowel perforation repair and colostomy, I want you to follow-up with your surgeon this coming Friday.  Currently on exam you appears stable.  I do want to get a CBC and metabolic panel today.  Particularly with a CBC I want to verify that your anemia has improved post surgery.  I will let you know the results of the tests when they are back.  Follow-up is as scheduled with your PCP and if any problems or questions please give [place] a call. Also will review surgeons follow-up notes when they were sent to [place].

## 2017-04-14 DIAGNOSIS — Z932 Ileostomy status: Secondary | ICD-10-CM | POA: Diagnosis not present

## 2017-04-14 DIAGNOSIS — K5090 Crohn's disease, unspecified, without complications: Secondary | ICD-10-CM | POA: Diagnosis not present

## 2017-04-14 DIAGNOSIS — F341 Dysthymic disorder: Secondary | ICD-10-CM | POA: Diagnosis not present

## 2017-04-14 DIAGNOSIS — Z48815 Encounter for surgical aftercare following surgery on the digestive system: Secondary | ICD-10-CM | POA: Diagnosis not present

## 2017-04-14 DIAGNOSIS — I5032 Chronic diastolic (congestive) heart failure: Secondary | ICD-10-CM | POA: Diagnosis not present

## 2017-04-14 DIAGNOSIS — I4891 Unspecified atrial fibrillation: Secondary | ICD-10-CM | POA: Diagnosis not present

## 2017-04-16 DIAGNOSIS — I4891 Unspecified atrial fibrillation: Secondary | ICD-10-CM | POA: Diagnosis not present

## 2017-04-16 DIAGNOSIS — Z932 Ileostomy status: Secondary | ICD-10-CM | POA: Diagnosis not present

## 2017-04-16 DIAGNOSIS — I5032 Chronic diastolic (congestive) heart failure: Secondary | ICD-10-CM | POA: Diagnosis not present

## 2017-04-16 DIAGNOSIS — F341 Dysthymic disorder: Secondary | ICD-10-CM | POA: Diagnosis not present

## 2017-04-16 DIAGNOSIS — K5090 Crohn's disease, unspecified, without complications: Secondary | ICD-10-CM | POA: Diagnosis not present

## 2017-04-16 DIAGNOSIS — Z48815 Encounter for surgical aftercare following surgery on the digestive system: Secondary | ICD-10-CM | POA: Diagnosis not present

## 2017-04-20 ENCOUNTER — Telehealth: Payer: Self-pay | Admitting: *Deleted

## 2017-04-20 DIAGNOSIS — Z48815 Encounter for surgical aftercare following surgery on the digestive system: Secondary | ICD-10-CM | POA: Diagnosis not present

## 2017-04-20 DIAGNOSIS — I5032 Chronic diastolic (congestive) heart failure: Secondary | ICD-10-CM | POA: Diagnosis not present

## 2017-04-20 DIAGNOSIS — F341 Dysthymic disorder: Secondary | ICD-10-CM | POA: Diagnosis not present

## 2017-04-20 DIAGNOSIS — I4891 Unspecified atrial fibrillation: Secondary | ICD-10-CM | POA: Diagnosis not present

## 2017-04-20 DIAGNOSIS — Z932 Ileostomy status: Secondary | ICD-10-CM | POA: Diagnosis not present

## 2017-04-20 DIAGNOSIS — K5090 Crohn's disease, unspecified, without complications: Secondary | ICD-10-CM | POA: Diagnosis not present

## 2017-04-20 NOTE — Telephone Encounter (Signed)
Received [place]; forwarded to provider/SLS 12/24

## 2017-04-22 DIAGNOSIS — I5032 Chronic diastolic (congestive) heart failure: Secondary | ICD-10-CM | POA: Diagnosis not present

## 2017-04-22 DIAGNOSIS — Z48815 Encounter for surgical aftercare following surgery on the digestive system: Secondary | ICD-10-CM | POA: Diagnosis not present

## 2017-04-22 DIAGNOSIS — K5090 Crohn's disease, unspecified, without complications: Secondary | ICD-10-CM | POA: Diagnosis not present

## 2017-04-22 DIAGNOSIS — Z932 Ileostomy status: Secondary | ICD-10-CM | POA: Diagnosis not present

## 2017-04-22 DIAGNOSIS — I4891 Unspecified atrial fibrillation: Secondary | ICD-10-CM | POA: Diagnosis not present

## 2017-04-22 DIAGNOSIS — F341 Dysthymic disorder: Secondary | ICD-10-CM | POA: Diagnosis not present

## 2017-04-27 DIAGNOSIS — Z932 Ileostomy status: Secondary | ICD-10-CM | POA: Diagnosis not present

## 2017-04-27 DIAGNOSIS — Z48815 Encounter for surgical aftercare following surgery on the digestive system: Secondary | ICD-10-CM | POA: Diagnosis not present

## 2017-04-27 DIAGNOSIS — I4891 Unspecified atrial fibrillation: Secondary | ICD-10-CM | POA: Diagnosis not present

## 2017-04-27 DIAGNOSIS — I5032 Chronic diastolic (congestive) heart failure: Secondary | ICD-10-CM | POA: Diagnosis not present

## 2017-04-27 DIAGNOSIS — F341 Dysthymic disorder: Secondary | ICD-10-CM | POA: Diagnosis not present

## 2017-04-27 DIAGNOSIS — K5090 Crohn's disease, unspecified, without complications: Secondary | ICD-10-CM | POA: Diagnosis not present

## 2017-04-28 HISTORY — PX: BREAST BIOPSY: SHX20

## 2017-04-30 ENCOUNTER — Telehealth: Payer: Self-pay | Admitting: Family Medicine

## 2017-04-30 DIAGNOSIS — Z48815 Encounter for surgical aftercare following surgery on the digestive system: Secondary | ICD-10-CM | POA: Diagnosis not present

## 2017-04-30 NOTE — Telephone Encounter (Signed)
Is this ok to do?  Its an incision.

## 2017-04-30 NOTE — Telephone Encounter (Signed)
Copied from [facility] [phone]. Topic: Quick Communication - See Telephone Encounter >> Apr 30, 2017  1:30 PM Antonieta Iba C wrote:  CRM for notification. See Telephone encounter for: Levada Dy w/ [facility] - [phone] called in to request verbal orders to continue working with pt twice a week to monitor pt's open incision. Please advise.     04/30/17.

## 2017-04-30 NOTE — Telephone Encounter (Signed)
How did she get incision--- ?  We have not seen it.  Ok to give verbal but she probably should be seen if possible.

## 2017-05-01 NOTE — Telephone Encounter (Signed)
Left detailed message on machine that ok to monitor twice a week and if patient can come in for ov then that will be great.

## 2017-05-05 ENCOUNTER — Telehealth: Payer: Self-pay | Admitting: *Deleted

## 2017-05-05 DIAGNOSIS — Z48815 Encounter for surgical aftercare following surgery on the digestive system: Secondary | ICD-10-CM | POA: Diagnosis not present

## 2017-05-05 NOTE — Telephone Encounter (Signed)
-----   Message -----  From: Jerene Bears, MD  Sent: 05/05/2017  8:22 AM  To: [name], MD, Larina Bras, CMA   Thanks Anson Crofts,  I will get her followup  Willis Modena  Mrs. Hassell Done needs followup with me  JMP   ----- Message -----  From: [name], MD  Sent: 05/04/2017  4:38 PM  To: Jerene Bears, MD   This lady is doing well post op from her perforation. She is wondering when to follow up with you.   tx  FB

## 2017-05-05 NOTE — Telephone Encounter (Signed)
Patient has been scheduled for follow up with Dr Hilarie Fredrickson on 05/21/17 at 245 pm. She verbalizes understanding.

## 2017-05-08 DIAGNOSIS — Z48815 Encounter for surgical aftercare following surgery on the digestive system: Secondary | ICD-10-CM | POA: Diagnosis not present

## 2017-05-11 ENCOUNTER — Encounter: Payer: Self-pay | Admitting: *Deleted

## 2017-05-12 DIAGNOSIS — Z48815 Encounter for surgical aftercare following surgery on the digestive system: Secondary | ICD-10-CM | POA: Diagnosis not present

## 2017-05-15 ENCOUNTER — Telehealth: Payer: Self-pay | Admitting: *Deleted

## 2017-05-15 DIAGNOSIS — Z48815 Encounter for surgical aftercare following surgery on the digestive system: Secondary | ICD-10-CM | POA: Diagnosis not present

## 2017-05-15 NOTE — Telephone Encounter (Signed)
Received Physician Orders from [facility]; forwarded to provider/SLS 01/18

## 2017-05-18 DIAGNOSIS — K5090 Crohn's disease, unspecified, without complications: Secondary | ICD-10-CM | POA: Diagnosis not present

## 2017-05-18 DIAGNOSIS — Z932 Ileostomy status: Secondary | ICD-10-CM | POA: Diagnosis not present

## 2017-05-19 ENCOUNTER — Encounter: Payer: Self-pay | Admitting: Family Medicine

## 2017-05-19 ENCOUNTER — Ambulatory Visit (INDEPENDENT_AMBULATORY_CARE_PROVIDER_SITE_OTHER): Payer: Medicare HMO | Admitting: Family Medicine

## 2017-05-19 VITALS — BP 118/78 | HR 89 | Temp 97.8°F | Resp 16 | Ht 66.0 in | Wt 147.0 lb

## 2017-05-19 DIAGNOSIS — R42 Dizziness and giddiness: Secondary | ICD-10-CM | POA: Diagnosis not present

## 2017-05-19 DIAGNOSIS — H9201 Otalgia, right ear: Secondary | ICD-10-CM | POA: Insufficient documentation

## 2017-05-19 LAB — CBC WITH DIFFERENTIAL/PLATELET
BASOS ABS: 0 10*3/uL (ref 0.0–0.1)
Basophils Relative: 0.4 % (ref 0.0–3.0)
Eosinophils Absolute: 0.1 10*3/uL (ref 0.0–0.7)
Eosinophils Relative: 1.1 % (ref 0.0–5.0)
HCT: 46 % (ref 36.0–46.0)
Hemoglobin: 15.6 g/dL — ABNORMAL HIGH (ref 12.0–15.0)
LYMPHS ABS: 2.1 10*3/uL (ref 0.7–4.0)
Lymphocytes Relative: 24.3 % (ref 12.0–46.0)
MCHC: 33.9 g/dL (ref 30.0–36.0)
MCV: 97 fl (ref 78.0–100.0)
MONO ABS: 0.8 10*3/uL (ref 0.1–1.0)
Monocytes Relative: 8.9 % (ref 3.0–12.0)
NEUTROS PCT: 65.3 % (ref 43.0–77.0)
Neutro Abs: 5.6 10*3/uL (ref 1.4–7.7)
Platelets: 312 10*3/uL (ref 150.0–400.0)
RBC: 4.74 Mil/uL (ref 3.87–5.11)
RDW: 13.8 % (ref 11.5–15.5)
WBC: 8.5 10*3/uL (ref 4.0–10.5)

## 2017-05-19 LAB — COMPREHENSIVE METABOLIC PANEL
ALK PHOS: 95 U/L (ref 39–117)
ALT: 8 U/L (ref 0–35)
AST: 15 U/L (ref 0–37)
Albumin: 4 g/dL (ref 3.5–5.2)
BUN: 11 mg/dL (ref 6–23)
CO2: 24 mEq/L (ref 19–32)
Calcium: 9.4 mg/dL (ref 8.4–10.5)
Chloride: 102 mEq/L (ref 96–112)
Creatinine, Ser: 0.81 mg/dL (ref 0.40–1.20)
GFR: 73.15 mL/min (ref >60.00)
GLUCOSE: 93 mg/dL (ref 70–99)
POTASSIUM: 4.3 meq/L (ref 3.5–5.1)
SODIUM: 133 meq/L — AB (ref 135–145)
TOTAL PROTEIN: 7.4 g/dL (ref 6.0–8.3)
Total Bilirubin: 0.4 mg/dL (ref 0.2–1.2)

## 2017-05-19 LAB — VITAMIN B12: Vitamin B-12: 494 pg/mL (ref 211–911)

## 2017-05-19 LAB — TSH: TSH: 1.66 u[IU]/mL (ref 0.35–4.50)

## 2017-05-19 MED ORDER — MECLIZINE HCL 25 MG PO TABS: 25.0000 mg | ORAL_TABLET | Freq: Three times a day (TID) | ORAL | 0 refills | Status: DC | PRN

## 2017-05-19 NOTE — Progress Notes (Signed)
Patient ID: Sara Barnes, female    DOB: [birth year]  Age: 77 y.o. MRN: [id number]    Subjective:  Subjective  HPI [name] presents for c/o dizziness x 2 weeks.   It was progressively getting worse after her bowel resection for crohns ---  The dizziness is new and constant.  She can't even walk a straight line.  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  She states she can not longer hear out of it.    Review of Systems  Constitutional: Negative for appetite change, diaphoresis, fatigue and unexpected weight change.  Eyes: Negative for pain, redness and visual disturbance.  Respiratory: Negative for cough, chest tightness, shortness of breath and wheezing.   Cardiovascular: Negative for chest pain, palpitations and leg swelling.  Endocrine: Negative for cold intolerance, heat intolerance, polydipsia, polyphagia and polyuria.  Genitourinary: Negative for difficulty urinating, dysuria and frequency.  Neurological: Positive for dizziness and light-headedness. Negative for numbness and headaches.    History Past Medical History:  Diagnosis Date  . Allergy   . Anemia   . Basal cell carcinoma   . Blood transfusion without reported diagnosis   . Bowel perforation ([place])   . 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 Cancer of lower-inner quadrant of left female breast ([name]) 11/30/2014  . Complication of anesthesia    anxiety post-op after ear surgeries  . Crohn's disease of large intestine with abscess ([place])   . Dental crowns present    recent root canal 11/2014  . Depression   . Family history of adverse reaction to anesthesia    pt's sister has hx. of post-op N/V  . GERD (gastroesophageal reflux disease)   . Indigestion   . PONV (postoperative nausea and vomiting)   . Runny nose 12/07/2014   clear drainage, per pt.  . Seasonal allergies   .  Ulcerative colitis   . Vitamin B12 deficiency     She has a past surgical history that includes Abdominal hysterectomy; Appendectomy; Breast enhancement surgery; Incision and drainage perirectal abscess (N/A, 09/28/2013); Breast lumpectomy (Left); Inner ear surgery (Bilateral); Colonoscopy with propofol (05/28/2011; 06/28/2014); Breast lumpectomy with radioactive seed and sentinel lymph node biopsy (Left, 12/13/2014); Excision basal cell carcinoma (Left); Flexible sigmoidoscopy (N/A, 01/08/2016); Incision and drainage perirectal abscess (N/A, 01/22/2016); Laparoscopic diverted colostomy (N/A, 02/11/2016); Incision and drainage abscess (2018); and laparoscopy (N/A, 03/23/2017).   Her family history includes Anesthesia problems in her sister; Asthma in her sister; Brain cancer in her father; Diabetes in her brother; Emphysema in her maternal grandfather; Kidney disease in her brother; Prostate cancer (age of onset: 34) in her brother; Rheum arthritis in her brother and mother.She reports that  has never smoked. she has never used smokeless tobacco. She reports that she drinks about 8.4 oz of alcohol per week. She reports that she does not use drugs.  Current Outpatient Medications on File Prior to Visit  Medication Sig Dispense Refill  . acetaminophen (TYLENOL) 500 MG tablet You can take 2 tablets every 6 hours as needed for pain.  Use this as your primary pain control.  Use Tramadol as a last resort.  You can buy this over the counter at any drug store.    DO NOT TAKE MORE THAN 4000 MG OF TYLENOL PER DAY.  IT CAN HARM YOUR  LIVER. 30 tablet 0  . Certolizumab Pegol (CIMZIA PREFILLED) 2 X 200 MG/ML KIT Talk with Dr. Hilarie Fredrickson and he will tell you when to resume this medicine. 1 kit 11  . cyanocobalamin (,VITAMIN B-12,) 1000 MCG/ML injection INJECT 1 ML INTRAMUSCULARLY EVERY 30 DAYS 5 mL 0  . Cyanocobalamin (VITAMIN B-12 IJ) Inject 1 mL as directed every 30 (thirty) days.    . famotidine (PEPCID) 40 MG tablet Take 1  tablet (40 mg total) by mouth 2 (two) times daily. (Patient taking differently: Take 40 mg by mouth daily as needed for heartburn or indigestion. )    . FLUoxetine (PROZAC) 20 MG capsule TAKE 1 CAPSULE TWICE DAILY 180 capsule 1  . ondansetron (ZOFRAN ODT) 4 MG disintegrating tablet Take 1 tablet (4 mg total) by mouth every 6 (six) hours as needed for nausea or vomiting. 90 tablet 1  . predniSONE (DELTASONE) 5 MG tablet Take 5 mg by mouth daily.     . simvastatin (ZOCOR) 20 MG tablet Take 1 tablet (20 mg total) by mouth daily. (Patient not taking: Reported on 04/13/2017) 30 tablet 3  . traMADol (ULTRAM) 50 MG tablet Take 1-2 tablets (50-100 mg total) by mouth every 6 (six) hours as needed for moderate pain or severe pain (pain not relieved by Tylenol). 40 tablet 0  . zolpidem (AMBIEN) 10 MG tablet TAKE ONE-HALF TABLET BY MOUTH TO 1 AT BEDTIME AS NEEDED FOR SLEEP 30 tablet 2   No current facility-administered medications on file prior to visit.      Objective:  Objective  Physical Exam  Constitutional: She is oriented to person, place, and time. She appears well-developed and well-nourished.  HENT:  Head: Normocephalic and atraumatic.  Right Ear: A middle ear effusion is present. Decreased hearing is noted.  Left Ear: Decreased hearing is noted.  Eyes: Conjunctivae and EOM are normal.  Neck: Normal range of motion. Neck supple. No JVD present. Carotid bruit is not present. No thyromegaly present.  Cardiovascular: Normal rate and regular rhythm.  No murmur heard. Pulmonary/Chest: Effort normal and breath sounds normal. No respiratory distress. She has no wheezes. She has no rales. She exhibits no tenderness.  Abdominal:  Surgical wound --- bandage in place, clean  Musculoskeletal: She exhibits no edema.  Neurological: She is alert and oriented to person, place, and time.  Psychiatric: She has a normal mood and affect.  Nursing note and vitals reviewed.  BP 118/78 (BP Location: Left Arm,  Patient Position: Sitting, Cuff Size: Normal)   Pulse 89   Temp 97.8 F (36.6 C) (Oral)   Resp 16   Ht _0  (1.676 m)   Wt 147 lb (66.7 kg)   SpO2 98%   BMI 23.73 kg/m  Wt Readings from Last 3 Encounters:  05/19/17 147 lb (66.7 kg)  04/13/17 145 lb 3.2 oz (65.9 kg)  03/24/17 156 lb 12 oz (71.1 kg)     Lab Results  Component Value Date   WBC 9.1 04/13/2017   HGB 13.6 04/13/2017   HCT 41.2 04/13/2017   PLT 368.0 04/13/2017   GLUCOSE 92 04/13/2017   CHOL 224 (H) 01/07/2017   TRIG 245.0 (H) 01/07/2017   HDL 94.90 01/07/2017   LDLDIRECT 110.0 01/07/2017   LDLCALC 70 10/02/2016   ALT 19 04/13/2017   AST 20 04/13/2017   NA 135 04/13/2017   K 3.8 04/13/2017   CL 102 04/13/2017   CREATININE 0.94 04/13/2017   BUN 9 04/13/2017   CO2 26 04/13/2017  TSH 1.746 04/19/2015   INR 1.03 03/23/2017   HGBA1C 5.5 12/18/2010   MICROALBUR <0.7 01/30/2015    Ct Abdomen Pelvis Wo Contrast  Result Date: 03/23/2017 CLINICAL DATA:  Bowel perforation. EXAM: CT ABDOMEN AND PELVIS WITHOUT CONTRAST TECHNIQUE: Multidetector CT imaging of the abdomen and pelvis was performed following the standard protocol without IV contrast. COMPARISON:  Radiographs of same day.  CT scan of July 16, 2016. FINDINGS: Lower chest: No acute abnormality. Hepatobiliary: No focal liver abnormality is seen. No gallstones, gallbladder wall thickening, or biliary dilatation. Pancreas: Unremarkable. No pancreatic ductal dilatation or surrounding inflammatory changes. Spleen: Normal in size without focal abnormality. Adrenals/Urinary Tract: Adrenal glands are unremarkable. Kidneys are normal, without renal calculi, focal lesion, or hydronephrosis. Bladder is unremarkable. Stomach/Bowel: The stomach is unremarkable. There is no evidence of bowel dilatation. Status post appendectomy. There is a large amount of pneumoperitoneum surrounding the cecum, which tracks along the right pericolic gutter and into the epigastric region. It  is also seen to extend into the mediastinum slightly. This is consistent with rupture of hollow viscus. Vascular/Lymphatic: Aortic atherosclerosis. No enlarged abdominal or pelvic lymph nodes. Reproductive: Status post hysterectomy. No adnexal masses. Other: No abnormal fluid collection or hernia is noted. Musculoskeletal: No acute or significant osseous findings. IMPRESSION: Large amount of pneumoperitoneum is noted around the cecum which extends into right pericolic gutter and epigastric region, consistent with rupture of hollow viscus. Critical Value/emergent results were called by telephone at the time of interpretation on 03/23/2017 at 9:30 p.m. to Dr. Jackolyn Confer , who verbally acknowledged these results. Electronically Signed   By: Marijo Conception, M.D.   On: 03/23/2017 21:46   Dg Abdomen Acute W/chest  Result Date: 03/23/2017 CLINICAL DATA:  Abdominal pain status post colonoscopy. EXAM: DG ABDOMEN ACUTE W/ 1V CHEST COMPARISON:  Radiographs of October 30, 2016. FINDINGS: No abnormal bowel gas pattern is noted. Pneumoperitoneum is noted concerning for rupture of hollow viscus. No radiopaque calculi or other significant radiographic abnormality is seen. Heart size and mediastinal contours are within normal limits. Both lungs are clear. IMPRESSION: Pneumoperitoneum is noted on decubitus view concerning for rupture of hollow viscus. No acute cardiopulmonary disease. Critical Value/emergent results were called by telephone at the time of interpretation on 03/23/2017 at 7:13 pm to Dr. Duffy Bruce , who verbally acknowledged these results. Electronically Signed   By: Marijo Conception, M.D.   On: 03/23/2017 19:14     Assessment & Plan:  Plan  I am having [name] start on meclizine. I am also having her maintain her famotidine, Cyanocobalamin (VITAMIN B-12 IJ), simvastatin, ondansetron, predniSONE, acetaminophen, Certolizumab Pegol, traMADol, cyanocobalamin, zolpidem, and FLUoxetine.  Meds  ordered this encounter  Medications  . meclizine (ANTIVERT) 25 MG tablet    Sig: Take 1 tablet (25 mg total) by mouth 3 (three) times daily as needed for dizziness.    Dispense:  30 tablet    Refill:  0    Problem List Items Addressed This Visit    None    Visit Diagnoses    Dizziness    -  Primary   Relevant Medications   meclizine (ANTIVERT) 25 MG tablet   Other Relevant Orders   EKG 12-Lead (Completed)   CBC with Differential/Platelet   Comprehensive metabolic panel   TSH   Vitamin B12   Ambulatory referral to ENT   Right ear pain       Relevant Orders   Ambulatory referral to ENT  Follow-up: Return if symptoms worsen or fail to improve.  Ann Held, DO

## 2017-05-19 NOTE — Assessment & Plan Note (Signed)
[id number]  Meclizine Check labs Pt being referred to ent for hearing

## 2017-05-19 NOTE — Patient Instructions (Signed)
Dizziness Dizziness is a common problem. It is a feeling of unsteadiness or light-headedness. You may feel like you are about to faint. Dizziness can lead to injury if you stumble or fall. Anyone can become dizzy, but dizziness is more common in older adults. This condition can be caused by a number of things, including medicines, dehydration, or illness. Follow these instructions at home: Eating and drinking  Drink enough fluid to keep your urine clear or pale yellow. This helps to keep you from becoming dehydrated. Try to drink more clear fluids, such as water.  Do not drink alcohol.  Limit your caffeine intake if told to do so by your health care provider. Check ingredients and nutrition facts to see if a food or beverage contains caffeine.  Limit your salt (sodium) intake if told to do so by your health care provider. Check ingredients and nutrition facts to see if a food or beverage contains sodium. Activity  Avoid making quick movements. ? Rise slowly from chairs and steady yourself until you feel okay. ? In the morning, first sit up on the side of the bed. When you feel okay, stand slowly while you hold onto something until you know that your balance is fine.  If you need to stand in one place for a long time, move your legs often. Tighten and relax the muscles in your legs while you are standing.  Do not drive or use heavy machinery if you feel dizzy.  Avoid bending down if you feel dizzy. Place items in your home so that they are easy for you to reach without leaning over. Lifestyle  Do not use any products that contain nicotine or tobacco, such as cigarettes and e-cigarettes. If you need help quitting, ask your health care provider.  Try to reduce your stress level by using methods such as yoga or meditation. Talk with your health care provider if you need help to manage your stress. General instructions  Watch your dizziness for any changes.  Take over-the-counter and  prescription medicines only as told by your health care provider. Talk with your health care provider if you think that your dizziness is caused by a medicine that you are taking.  Tell a friend or a family member that you are feeling dizzy. If he or she notices any changes in your behavior, have this person call your health care provider.  Keep all follow-up visits as told by your health care provider. This is important. Contact a health care provider if:  Your dizziness does not go away.  Your dizziness or light-headedness gets worse.  You feel nauseous.  You have reduced hearing.  You have new symptoms.  You are unsteady on your feet or you feel like the room is spinning. Get help right away if:  You vomit or have diarrhea and are unable to eat or drink anything.  You have problems talking, walking, swallowing, or using your arms, hands, or legs.  You feel generally weak.  You are not thinking clearly or you have trouble forming sentences. It may take a friend or family member to notice this.  You have chest pain, abdominal pain, shortness of breath, or sweating.  Your vision changes.  You have any bleeding.  You have a severe headache.  You have neck pain or a stiff neck.  You have a fever. These symptoms may represent a serious problem that is an emergency. Do not wait to see if the symptoms will go away. Get medical help  right away. Call your local emergency services (911 in the [place] Do not drive yourself to the hospital. Summary  Dizziness is a feeling of unsteadiness or light-headedness. This condition can be caused by a number of things, including medicines, dehydration, or illness.  Anyone can become dizzy, but dizziness is more common in older adults.  Drink enough fluid to keep your urine clear or pale yellow. Do not drink alcohol.  Avoid making quick movements if you feel dizzy. Monitor your dizziness for any changes. This information is not intended to  replace advice given to you by your health care provider. Make sure you discuss any questions you have with your health care provider. Document Released: 10/08/2000 Document Revised: 05/17/2016 Document Reviewed: 05/17/2016 Elsevier Interactive Patient Education  [employer].

## 2017-05-19 NOTE — Assessment & Plan Note (Signed)
Refer to ent-- pt with hx of implants and worsening hearing

## 2017-05-20 DIAGNOSIS — Z48815 Encounter for surgical aftercare following surgery on the digestive system: Secondary | ICD-10-CM | POA: Diagnosis not present

## 2017-05-21 ENCOUNTER — Emergency Department (HOSPITAL_COMMUNITY): Payer: Medicare HMO

## 2017-05-21 ENCOUNTER — Inpatient Hospital Stay (HOSPITAL_COMMUNITY)
Admission: EM | Admit: 2017-05-21 | Discharge: 2017-06-09 | DRG: 330 | Disposition: A | Discharge status: Home Health | Payer: Medicare HMO | Attending: General Surgery | Admitting: General Surgery

## 2017-05-21 ENCOUNTER — Encounter (HOSPITAL_COMMUNITY): Payer: Self-pay

## 2017-05-21 ENCOUNTER — Ambulatory Visit: Payer: Medicare HMO | Admitting: *Deleted

## 2017-05-21 ENCOUNTER — Other Ambulatory Visit: Payer: Self-pay

## 2017-05-21 ENCOUNTER — Ambulatory Visit: Payer: Medicare HMO | Admitting: Internal Medicine

## 2017-05-21 DIAGNOSIS — Z882 Allergy status to sulfonamides status: Secondary | ICD-10-CM | POA: Diagnosis not present

## 2017-05-21 DIAGNOSIS — Z8261 Family history of arthritis: Secondary | ICD-10-CM

## 2017-05-21 DIAGNOSIS — K50118 Crohn's disease of large intestine with other complication: Secondary | ICD-10-CM

## 2017-05-21 DIAGNOSIS — Z4682 Encounter for fitting and adjustment of non-vascular catheter: Secondary | ICD-10-CM | POA: Diagnosis not present

## 2017-05-21 DIAGNOSIS — I110 Hypertensive heart disease with heart failure: Secondary | ICD-10-CM | POA: Diagnosis present

## 2017-05-21 DIAGNOSIS — K5669 Other partial intestinal obstruction: Secondary | ICD-10-CM | POA: Diagnosis present

## 2017-05-21 DIAGNOSIS — Z90710 Acquired absence of both cervix and uterus: Secondary | ICD-10-CM | POA: Diagnosis not present

## 2017-05-21 DIAGNOSIS — I4891 Unspecified atrial fibrillation: Secondary | ICD-10-CM | POA: Diagnosis present

## 2017-05-21 DIAGNOSIS — Z9049 Acquired absence of other specified parts of digestive tract: Secondary | ICD-10-CM

## 2017-05-21 DIAGNOSIS — K219 Gastro-esophageal reflux disease without esophagitis: Secondary | ICD-10-CM | POA: Diagnosis present

## 2017-05-21 DIAGNOSIS — Z432 Encounter for attention to ileostomy: Secondary | ICD-10-CM | POA: Diagnosis not present

## 2017-05-21 DIAGNOSIS — K5090 Crohn's disease, unspecified, without complications: Secondary | ICD-10-CM | POA: Diagnosis present

## 2017-05-21 DIAGNOSIS — J45909 Unspecified asthma, uncomplicated: Secondary | ICD-10-CM | POA: Diagnosis not present

## 2017-05-21 DIAGNOSIS — I5032 Chronic diastolic (congestive) heart failure: Secondary | ICD-10-CM | POA: Diagnosis present

## 2017-05-21 DIAGNOSIS — Z808 Family history of malignant neoplasm of other organs or systems: Secondary | ICD-10-CM

## 2017-05-21 DIAGNOSIS — Z7952 Long term (current) use of systemic steroids: Secondary | ICD-10-CM

## 2017-05-21 DIAGNOSIS — Z932 Ileostomy status: Secondary | ICD-10-CM

## 2017-05-21 DIAGNOSIS — Z841 Family history of disorders of kidney and ureter: Secondary | ICD-10-CM | POA: Diagnosis not present

## 2017-05-21 DIAGNOSIS — K9413 Enterostomy malfunction: Secondary | ICD-10-CM | POA: Diagnosis present

## 2017-05-21 DIAGNOSIS — K5010 Crohn's disease of large intestine without complications: Secondary | ICD-10-CM | POA: Diagnosis present

## 2017-05-21 DIAGNOSIS — K9419 Other complications of enterostomy: Secondary | ICD-10-CM | POA: Diagnosis not present

## 2017-05-21 DIAGNOSIS — F411 Generalized anxiety disorder: Secondary | ICD-10-CM | POA: Diagnosis present

## 2017-05-21 DIAGNOSIS — R1084 Generalized abdominal pain: Secondary | ICD-10-CM | POA: Diagnosis not present

## 2017-05-21 DIAGNOSIS — K9410 Enterostomy complication, unspecified: Secondary | ICD-10-CM | POA: Diagnosis not present

## 2017-05-21 DIAGNOSIS — R933 Abnormal findings on diagnostic imaging of other parts of digestive tract: Secondary | ICD-10-CM | POA: Diagnosis not present

## 2017-05-21 DIAGNOSIS — R1031 Right lower quadrant pain: Secondary | ICD-10-CM | POA: Diagnosis not present

## 2017-05-21 DIAGNOSIS — J9811 Atelectasis: Secondary | ICD-10-CM | POA: Diagnosis not present

## 2017-05-21 DIAGNOSIS — Z833 Family history of diabetes mellitus: Secondary | ICD-10-CM | POA: Diagnosis not present

## 2017-05-21 DIAGNOSIS — R1110 Vomiting, unspecified: Secondary | ICD-10-CM | POA: Diagnosis not present

## 2017-05-21 DIAGNOSIS — Z853 Personal history of malignant neoplasm of breast: Secondary | ICD-10-CM

## 2017-05-21 DIAGNOSIS — K589 Irritable bowel syndrome without diarrhea: Secondary | ICD-10-CM | POA: Diagnosis not present

## 2017-05-21 DIAGNOSIS — Z885 Allergy status to narcotic agent status: Secondary | ICD-10-CM

## 2017-05-21 DIAGNOSIS — Z888 Allergy status to other drugs, medicaments and biological substances status: Secondary | ICD-10-CM | POA: Diagnosis not present

## 2017-05-21 DIAGNOSIS — D649 Anemia, unspecified: Secondary | ICD-10-CM | POA: Diagnosis present

## 2017-05-21 DIAGNOSIS — Z85828 Personal history of other malignant neoplasm of skin: Secondary | ICD-10-CM | POA: Diagnosis not present

## 2017-05-21 DIAGNOSIS — K56699 Other intestinal obstruction unspecified as to partial versus complete obstruction: Secondary | ICD-10-CM

## 2017-05-21 DIAGNOSIS — Z6823 Body mass index (BMI) 23.0-23.9, adult: Secondary | ICD-10-CM

## 2017-05-21 DIAGNOSIS — I700 Atherosclerosis of aorta: Secondary | ICD-10-CM | POA: Diagnosis present

## 2017-05-21 DIAGNOSIS — E46 Unspecified protein-calorie malnutrition: Secondary | ICD-10-CM | POA: Diagnosis present

## 2017-05-21 DIAGNOSIS — K56609 Unspecified intestinal obstruction, unspecified as to partial versus complete obstruction: Secondary | ICD-10-CM

## 2017-05-21 DIAGNOSIS — R112 Nausea with vomiting, unspecified: Secondary | ICD-10-CM | POA: Diagnosis not present

## 2017-05-21 DIAGNOSIS — G8929 Other chronic pain: Secondary | ICD-10-CM | POA: Diagnosis present

## 2017-05-21 DIAGNOSIS — F329 Major depressive disorder, single episode, unspecified: Secondary | ICD-10-CM | POA: Diagnosis present

## 2017-05-21 DIAGNOSIS — K50919 Crohn's disease, unspecified, with unspecified complications: Secondary | ICD-10-CM | POA: Diagnosis not present

## 2017-05-21 DIAGNOSIS — Z825 Family history of asthma and other chronic lower respiratory diseases: Secondary | ICD-10-CM | POA: Diagnosis not present

## 2017-05-21 DIAGNOSIS — Z8042 Family history of malignant neoplasm of prostate: Secondary | ICD-10-CM | POA: Diagnosis not present

## 2017-05-21 DIAGNOSIS — K279 Peptic ulcer, site unspecified, unspecified as acute or chronic, without hemorrhage or perforation: Secondary | ICD-10-CM | POA: Diagnosis not present

## 2017-05-21 DIAGNOSIS — R109 Unspecified abdominal pain: Secondary | ICD-10-CM | POA: Diagnosis not present

## 2017-05-21 DIAGNOSIS — R198 Other specified symptoms and signs involving the digestive system and abdomen: Secondary | ICD-10-CM | POA: Diagnosis present

## 2017-05-21 HISTORY — DX: Anal abscess: K61.0

## 2017-05-21 HISTORY — DX: Unspecified temporomandibular joint disorder, unspecified side: M26.609

## 2017-05-21 LAB — COMPREHENSIVE METABOLIC PANEL
ALBUMIN: 3.7 g/dL (ref 3.5–5.0)
ALT: 10 U/L — ABNORMAL LOW (ref 14–54)
ANION GAP: 11 (ref 5–15)
AST: 21 U/L (ref 15–41)
Alkaline Phosphatase: 97 U/L (ref 38–126)
BUN: 8 mg/dL (ref 6–20)
CHLORIDE: 107 mmol/L (ref 101–111)
CO2: 17 mmol/L — ABNORMAL LOW (ref 22–32)
Calcium: 9.2 mg/dL (ref 8.9–10.3)
Creatinine, Ser: 0.78 mg/dL (ref 0.44–1.00)
GFR calc Af Amer: >60 mL/min (ref >60)
GFR calc non Af Amer: >60 mL/min (ref >60)
GLUCOSE: 128 mg/dL — AB (ref 65–99)
POTASSIUM: 3.7 mmol/L (ref 3.5–5.1)
SODIUM: 135 mmol/L (ref 135–145)
TOTAL PROTEIN: 7.4 g/dL (ref 6.5–8.1)
Total Bilirubin: 0.4 mg/dL (ref 0.3–1.2)

## 2017-05-21 LAB — URINALYSIS, ROUTINE W REFLEX MICROSCOPIC
Bacteria, UA: NONE SEEN
Bilirubin Urine: NEGATIVE
Glucose, UA: NEGATIVE mg/dL
Ketones, ur: NEGATIVE mg/dL
NITRITE: NEGATIVE
PROTEIN: NEGATIVE mg/dL
Specific Gravity, Urine: >1.046 — ABNORMAL HIGH (ref 1.005–1.030)
pH: 5 (ref 5.0–8.0)

## 2017-05-21 LAB — CBC
HCT: 46.1 % — ABNORMAL HIGH (ref 36.0–46.0)
HCT: 46.7 % — ABNORMAL HIGH (ref 36.0–46.0)
HEMOGLOBIN: 15.3 g/dL — AB (ref 12.0–15.0)
HEMOGLOBIN: 16 g/dL — AB (ref 12.0–15.0)
MCH: 32.5 pg (ref 26.0–34.0)
MCH: 33 pg (ref 26.0–34.0)
MCHC: 33.2 g/dL (ref 30.0–36.0)
MCHC: 34.3 g/dL (ref 30.0–36.0)
MCV: 96.3 fL (ref 78.0–100.0)
MCV: 97.9 fL (ref 78.0–100.0)
Platelets: 293 10*3/uL (ref 150–400)
Platelets: 307 10*3/uL (ref 150–400)
RBC: 4.71 MIL/uL (ref 3.87–5.11)
RBC: 4.85 MIL/uL (ref 3.87–5.11)
RDW: 13.6 % (ref 11.5–15.5)
RDW: 13.7 % (ref 11.5–15.5)
WBC: 11.6 10*3/uL — ABNORMAL HIGH (ref 4.0–10.5)
WBC: 14.6 10*3/uL — ABNORMAL HIGH (ref 4.0–10.5)

## 2017-05-21 LAB — LIPASE, BLOOD: Lipase: 25 U/L (ref 11–51)

## 2017-05-21 LAB — CREATININE, SERUM
Creatinine, Ser: 0.74 mg/dL (ref 0.44–1.00)
GFR calc Af Amer: >60 mL/min (ref >60)
GFR calc non Af Amer: >60 mL/min (ref >60)

## 2017-05-21 MED ORDER — ENOXAPARIN SODIUM 40 MG/0.4ML ~~LOC~~ SOLN
40.0000 mg | SUBCUTANEOUS | Status: DC
  Administered 2017-05-21 – 2017-06-08 (×16): 40 mg via SUBCUTANEOUS
  Filled 2017-05-21 (×18): qty 0.4

## 2017-05-21 MED ORDER — HYDRALAZINE HCL 20 MG/ML IJ SOLN: 10.0000 mg | INTRAMUSCULAR | Status: DC | PRN

## 2017-05-21 MED ORDER — SIMETHICONE 80 MG PO CHEW
40.0000 mg | CHEWABLE_TABLET | Freq: Four times a day (QID) | ORAL | Status: DC | PRN
  Administered 2017-05-22 – 2017-05-24 (×3): 40 mg via ORAL
  Filled 2017-05-21 (×4): qty 1

## 2017-05-21 MED ORDER — DIPHENHYDRAMINE HCL 12.5 MG/5ML PO ELIX
12.5000 mg | ORAL_SOLUTION | Freq: Four times a day (QID) | ORAL | Status: DC | PRN
  Administered 2017-05-22 – 2017-05-23 (×3): 12.5 mg via ORAL
  Filled 2017-05-21 (×4): qty 5

## 2017-05-21 MED ORDER — LORAZEPAM 2 MG/ML IJ SOLN
0.5000 mg | Freq: Three times a day (TID) | INTRAMUSCULAR | Status: DC | PRN
  Administered 2017-05-25: 1 mg via INTRAVENOUS
  Filled 2017-05-21: qty 1

## 2017-05-21 MED ORDER — METOPROLOL TARTRATE 5 MG/5ML IV SOLN: 5.0000 mg | Freq: Four times a day (QID) | INTRAVENOUS | Status: DC | PRN

## 2017-05-21 MED ORDER — FENTANYL CITRATE (PF) 100 MCG/2ML IJ SOLN
100.0000 ug | Freq: Once | INTRAMUSCULAR | Status: AC
  Administered 2017-05-21: 100 ug via INTRAVENOUS
  Filled 2017-05-21: qty 2

## 2017-05-21 MED ORDER — HYDROMORPHONE HCL 1 MG/ML IJ SOLN
0.5000 mg | INTRAMUSCULAR | Status: DC | PRN
  Administered 2017-05-21: 1 mg via INTRAVENOUS
  Administered 2017-05-21: 0.5 mg via INTRAVENOUS
  Administered 2017-05-21 – 2017-05-22 (×3): 1 mg via INTRAVENOUS
  Administered 2017-05-22: 0.5 mg via INTRAVENOUS
  Administered 2017-05-22 – 2017-06-03 (×43): 1 mg via INTRAVENOUS
  Filled 2017-05-21 (×49): qty 1

## 2017-05-21 MED ORDER — PANTOPRAZOLE SODIUM 40 MG IV SOLR
40.0000 mg | Freq: Every day | INTRAVENOUS | Status: DC
  Administered 2017-05-21: 40 mg via INTRAVENOUS
  Filled 2017-05-21: qty 40

## 2017-05-21 MED ORDER — IOPAMIDOL (ISOVUE-300) INJECTION 61%
INTRAVENOUS | Status: AC
  Administered 2017-05-21: 100 mL via INTRAVENOUS
  Filled 2017-05-21: qty 100

## 2017-05-21 MED ORDER — IOPAMIDOL (ISOVUE-300) INJECTION 61%
100.0000 mL | Freq: Once | INTRAVENOUS | Status: AC | PRN
  Administered 2017-05-21: 100 mL via INTRAVENOUS

## 2017-05-21 MED ORDER — ONDANSETRON 4 MG PO TBDP: 4.0000 mg | ORAL_TABLET | Freq: Four times a day (QID) | ORAL | Status: DC | PRN

## 2017-05-21 MED ORDER — ONDANSETRON HCL 4 MG/2ML IJ SOLN
4.0000 mg | Freq: Four times a day (QID) | INTRAMUSCULAR | Status: DC | PRN
  Administered 2017-05-21 – 2017-05-24 (×3): 4 mg via INTRAVENOUS
  Filled 2017-05-21 (×3): qty 2

## 2017-05-21 MED ORDER — ONDANSETRON HCL 4 MG/2ML IJ SOLN
4.0000 mg | Freq: Once | INTRAMUSCULAR | Status: AC | PRN
  Administered 2017-05-21: 4 mg via INTRAVENOUS
  Filled 2017-05-21: qty 2

## 2017-05-21 MED ORDER — DIPHENHYDRAMINE HCL 50 MG/ML IJ SOLN
12.5000 mg | Freq: Four times a day (QID) | INTRAMUSCULAR | Status: DC | PRN
  Administered 2017-05-21: 12.5 mg via INTRAVENOUS
  Filled 2017-05-21: qty 1

## 2017-05-21 MED ORDER — POTASSIUM CHLORIDE IN NACL 20-0.45 MEQ/L-% IV SOLN
INTRAVENOUS | Status: DC
  Administered 2017-05-21 – 2017-05-25 (×5): via INTRAVENOUS
  Filled 2017-05-21 (×7): qty 1000

## 2017-05-21 NOTE — ED Notes (Signed)
Pt try urinate but she was not successful but will try later.

## 2017-05-21 NOTE — ED Notes (Signed)
Sent page via Am[facility] to Dr. Excell Seltzer regarding pt not being able to tolerate her IVF's

## 2017-05-21 NOTE — Consult Note (Signed)
Referring Provider: [facility] Primary Care Physician:  Carollee Herter, Alferd Apa, DO Primary Gastroenterologist:  Zenovia Jarred,  MD  Reason for Consultation: bowel obstruction / Crohn's disease  ASSESSMENT AND PLAN:    82. 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 She is s/p right hemicolectomy for cecal perforation in late November. Not on IBD meds since, except for prednisone 5 mg daily. She developed antibodies to Humira. Plan was to try Cimzia once healed from surgery.   2. Partial / early SBO at ileostomy site. No inflammation on CT scan to suggest active Crohn's. Additionally she has never had small bowel involvement so suspect mechanical obstruction.      Attending physician's note   I have taken a history, examined the patient and reviewed the chart. I agree with the Advanced Practitioner's note, impression and recommendations. Acute onset of RLQ pain, N/V and CT showing dilated SB with abrupt taper at ostomy site c/w partial SBO at ileostomy site. No inflammatory changes on CT to suggest enteritis and previously has only had Crohn's colitis. All information points to a non inflammatory mechanical obstruction that is improving with NG suction.  Continue Prednisone 5 mg daily. Continue with plans to start Cimzia as outpatient with Dr. Hilarie Fredrickson. GI available if needed, please call.   Lucio Edward, MD [name] [phone] Mon-Fri 8a-5p [phone] after 5p, weekends, holidays   HPI: Sara Barnes is a 77 y.o. female with a complex Crohn's history. She has Crohn's colitis with history of perianal fistulas and end ileostomy. She has been seen by Dr. Emelda Fear at [facility] as well as Dr. Kem Parkinson with [facility]. Sara Barnes was started on Humira in June 2018 but felt like it made her worse. I met her for the first time in the office on 02/26/17 for evaluation of RLQ pain, rectal bleeding and nausea. She was still on Humira at the time. We recommended a CT scan  but she did not want to have it done. Labs showed she had developed Humira antibodies so it was stopped.  She was scheduled for colonoscopy but procedure aborted due to significant and worsening left sided Crohn's.  Unfortunately the day following colonoscopy patient was hospitalized with a cecal perforation and underwent right hemicolectomy for cecal perforation 03/24/18 . She was supposed to see Dr. Hilarie Fredrickson in the office today to discuss resumption of IBD meds. Plan was to start Cimzia.   Patient has been struggling with significant dizziness and right ear problems. She is followed by ENT. Her appetite has been poor because she hasn't been feeling well. No particular GI complaints until yesterday when she acutely developed RLQ pain followed later in the day by nausea and vomiting. The RLQ pain became more diffuse as day went on and ostomy output decreased. She has been on 5 mg of prednisone at home.   WBC yesterday was 14.6. Afebrile. CT scan with contrast c/w partial / early SBO with transition point at level of ostomy. She is getting NGT decompression and ostomy output has resumed today. .    Past Medical History:  Diagnosis Date  . Allergy   . Anemia   . Basal cell carcinoma   . Blood transfusion without reported diagnosis   . Bowel perforation ([place])   . 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 Complication of anesthesia    anxiety post-op after  ear surgeries  . Crohn's disease of large intestine with abscess ([place])   . Dental crowns present    recent root canal 11/2014  . Depression   . Family history of adverse reaction to anesthesia    pt's sister has hx. of post-op N/V  . GERD (gastroesophageal reflux disease)   . Indigestion   . PONV (postoperative nausea and vomiting)   . Runny nose 12/07/2014   clear drainage, per pt.  . Seasonal allergies   . Ulcerative colitis   . Vitamin  B12 deficiency     Past Surgical History:  Procedure Laterality Date  . ABDOMINAL HYSTERECTOMY     partial  . APPENDECTOMY    . BASAL CELL CARCINOMA EXCISION Left    nose  . BREAST ENHANCEMENT SURGERY    . BREAST LUMPECTOMY Left    age 54's  . BREAST LUMPECTOMY WITH RADIOACTIVE SEED AND SENTINEL LYMPH NODE BIOPSY Left 12/13/2014   Procedure: LEFT BREAST LUMPECTOMY WITH RADIOACTIVE SEED AND SENTINEL LYMPH NODE BIOPSY;  Surgeon:  Klein, MD;  Location: [facility];  Service: General;  Laterality: Left;  . COLONOSCOPY WITH PROPOFOL  05/28/2011; 06/28/2014  . FLEXIBLE SIGMOIDOSCOPY N/A 01/08/2016   Procedure: FLEXIBLE SIGMOIDOSCOPY;  Surgeon: [name], MD;  Location: [facility];  Service: Gastroenterology;  Laterality: N/A;  needs MAC due to pain and anxiety  . INCISION AND DRAINAGE ABSCESS  2018  . INCISION AND DRAINAGE PERIRECTAL ABSCESS N/A 09/28/2013   Procedure: IRRIGATION AND DEBRIDEMENT PERIANAL ABSCESS, proctoscopy;  Surgeon: Shann Medal, MD;  Location: [facility];  Service: General;  Laterality: N/A;  . INCISION AND DRAINAGE PERIRECTAL ABSCESS N/A 01/22/2016   Procedure: IRRIGATION AND DEBRIDEMENT PERIRECTAL ABSCESS;  Surgeon: Armandina Gemma, MD;  Location: [facility];  Service: General;  Laterality: N/A;  . INNER EAR SURGERY Bilateral   . LAPAROSCOPIC DIVERTED COLOSTOMY N/A 02/11/2016   Procedure: LAPAROSCOPIC DIVERTED ILEOSTOMY;  Surgeon: Johnathan Hausen, MD;  Location: [facility];  Service: General;  Laterality: N/A;  . LAPAROSCOPY N/A 03/23/2017   Procedure: LAPAROSCOPY DIAGNOSTIC, EXPLORATORY LAPAROTOMY,  BOWEL RESECTION;  Surgeon: Jackolyn Confer, MD;  Location: [facility];  Service: General;  Laterality: N/A;    Prior to Admission medications   Medication Sig Start Date End Date Taking? Authorizing Provider  acetaminophen (TYLENOL) 500 MG tablet You can take 2 tablets every 6 hours as needed for pain.  Use this as your primary pain control.  Use Tramadol as a  last resort.  You can buy this over the counter at any drug store.    DO NOT TAKE MORE THAN 4000 MG OF TYLENOL PER DAY.  IT CAN HARM YOUR LIVER. 03/31/17  Yes Earnstine Regal, PA-C  cyanocobalamin (,VITAMIN B-12,) 1000 MCG/ML injection INJECT 1 ML INTRAMUSCULARLY EVERY 30 DAYS 03/31/17  Yes Pyrtle, Lajuan Lines, MD  famotidine (PEPCID) 40 MG tablet Take 1 tablet (40 mg total) by mouth 2 (two) times daily. Patient taking differently: Take 40 mg by mouth daily as needed for heartburn or indigestion.  02/29/16  Yes Meuth, Brooke A, PA-C  FLUoxetine (PROZAC) 20 MG capsule TAKE 1 CAPSULE TWICE DAILY 04/08/17  Yes Pyrtle, Lajuan Lines, MD  meclizine (ANTIVERT) 25 MG tablet Take 1 tablet (25 mg total) by mouth 3 (three) times daily as needed for dizziness. 05/19/17  Yes Roma Schanz R, DO  ondansetron (ZOFRAN ODT) 4 MG disintegrating tablet Take 1 tablet (4 mg total) by mouth every 6 (six) hours as needed for nausea or vomiting.  03/17/17  Yes Pyrtle, Lajuan Lines, MD  predniSONE (DELTASONE) 5 MG tablet Take 5 mg by mouth daily.  06/05/16  Yes [provider]  traMADol (ULTRAM) 50 MG tablet Take 1-2 tablets (50-100 mg total) by mouth every 6 (six) hours as needed for moderate pain or severe pain (pain not relieved by Tylenol). 03/31/17  Yes Earnstine Regal, PA-C  zolpidem (AMBIEN) 10 MG tablet TAKE ONE-HALF TABLET BY MOUTH TO 1 AT BEDTIME AS NEEDED FOR SLEEP 04/01/17  Yes Pyrtle, Lajuan Lines, MD  Certolizumab Pegol (CIMZIA PREFILLED) 2 X 200 MG/ML KIT Talk with Dr. Hilarie Fredrickson and he will tell you when to resume this medicine. 03/31/17   Earnstine Regal, PA-C  simvastatin (ZOCOR) 20 MG tablet Take 1 tablet (20 mg total) by mouth daily. Patient not taking: Reported on 04/13/2017 01/15/17   Ann Held, DO    Current Facility-Administered Medications  Medication Dose Route Frequency Provider Last Rate Last Dose  . 0.45 % NaCl with KCl 20 mEq / L infusion   Intravenous Continuous Meuth, Brooke A, PA-C 75 mL/hr at  05/21/17 1213    . diphenhydrAMINE (BENADRYL) 12.5 MG/5ML elixir 12.5 mg  12.5 mg Oral Q6H PRN Meuth, Brooke A, PA-C       Or  . diphenhydrAMINE (BENADRYL) injection 12.5 mg  12.5 mg Intravenous Q6H PRN Meuth, Brooke A, PA-C      . enoxaparin (LOVENOX) injection 40 mg  40 mg Subcutaneous Q24H Meuth, Brooke A, PA-C   40 mg at 05/21/17 1219  . hydrALAZINE (APRESOLINE) injection 10 mg  10 mg Intravenous Q2H PRN Meuth, Brooke A, PA-C      . HYDROmorphone (DILAUDID) injection 0.5-1 mg  0.5-1 mg Intravenous Q3H PRN Meuth, Brooke A, PA-C   0.5 mg at 05/21/17 1012  . LORazepam (ATIVAN) injection 0.5-1 mg  0.5-1 mg Intravenous Q8H PRN Meuth, Brooke A, PA-C      . metoprolol tartrate (LOPRESSOR) injection 5 mg  5 mg Intravenous Q6H PRN Meuth, Brooke A, PA-C      . ondansetron (ZOFRAN-ODT) disintegrating tablet 4 mg  4 mg Oral Q6H PRN Meuth, Brooke A, PA-C       Or  . ondansetron (ZOFRAN) injection 4 mg  4 mg Intravenous Q6H PRN Meuth, Brooke A, PA-C      . pantoprazole (PROTONIX) injection 40 mg  40 mg Intravenous QHS Meuth, Brooke A, PA-C      . simethicone (MYLICON) chewable tablet 40 mg  40 mg Oral Q6H PRN Meuth, Brooke A, PA-C        Allergies as of 05/21/2017 - Review Complete 05/21/2017  Allergen Reaction Noted  . Codeine Nausea And Vomiting 01/20/2008  . Mesalamine Nausea And Vomiting 05/13/2011  . Other Itching 12/07/2014  . Oxycodone-acetaminophen Rash 01/04/2015  . Sulfa antibiotics Rash 12/07/2014    Family History  Problem Relation Age of Onset  . Prostate cancer Brother 92  . Kidney disease Brother   . Diabetes Brother   . Brain cancer Father   . Rheum arthritis Mother   . Anesthesia problems Sister        post-op N/V  . Emphysema Maternal Grandfather   . Asthma Sister   . Rheum arthritis Brother   . Colon cancer Neg Hx   . Esophageal cancer Neg Hx   . Breast cancer Neg Hx     Social History   Socioeconomic History  . Marital status: Widowed    Spouse name: Not on  file  .  Number of children: 1  . Years of education: Not on file  . Highest education level: Not on file  Social Needs  . Financial resource strain: Not on file  . Food insecurity - worry: Not on file  . Food insecurity - inability: Not on file  . Transportation needs - medical: Not on file  . Transportation needs - non-medical: Not on file  Occupational History  . Occupation: Warden/ranger    Comment: retired at age 55  Tobacco Use  . Smoking status: Never Smoker  . Smokeless tobacco: Never Used  Substance and Sexual Activity  . Alcohol use: Yes    Alcohol/week: 8.4 oz    Types: 14 Glasses of wine per week  . Drug use: No  . Sexual activity: Not Currently    Partners: Male  Other Topics Concern  . Not on file  Social History Narrative   No exercise due to health    Review of Systems: All systems reviewed and negative except where noted in HPI.  Physical Exam: Vital signs in last 24 hours: Temp:  [97.7 F (36.5 C)-97.9 F (36.6 C)] 97.7 F (36.5 C) (01/24 0126) Pulse Rate:  [82-105] 84 (01/24 1356) Resp:  [18-20] 18 (01/24 1356) BP: (114-152)/(72-104) 130/78 (01/24 1356) SpO2:  [92 %-98 %] 98 % (01/24 1356) Weight:  [147 lb (66.7 kg)] 147 lb (66.7 kg) (01/24 0031) Last BM Date: 05/21/17 General:   Alert, well-developed, white female in NAD Psych:  Pleasant, cooperative. Normal mood and affect. Eyes:  Pupils equal, sclera clear, no icterus.   Conjunctiva pink. Ears:  Normal auditory acuity. Nose:  No deformity, discharge,  or lesions. Neck:  Supple; no masses Lungs:  Clear throughout to auscultation.   No wheezes, crackles, or rhonchi.  Heart:  Regular rate and rhythm; no edema Abdomen:  Soft, non-distended, mild RLQ tenderness. BS c/w NGT suctioning   Rectal:  Deferred  Msk:  Symmetrical without gross deformities. . Pulses:  Normal pulses noted. Neurologic:  Alert and  oriented x4;  grossly normal neurologically. Skin:  Intact without significant lesions or  rashes..   Intake/Output from previous day: 01/23 0701 - 01/24 0700 In: -  Out: 750 [Stool:750] Intake/Output this shift: Total I/O In: 320 [P.O.:120; I.V.:200] Out: 1250 [Urine:250; Emesis/NG output:200; Other:100; Stool:700]  Lab Results: Recent Labs    05/19/17 1138 05/21/17 0049 05/21/17 1003  WBC 8.5 14.6* 11.6*  HGB 15.6* 16.0* 15.3*  HCT 46.0 46.7* 46.1*  PLT 312.0 307 293   BMET Recent Labs    05/19/17 1138 05/21/17 0049 05/21/17 1003  NA 133* 135  --   K 4.3 3.7  --   CL 102 107  --   CO2 24 17*  --   GLUCOSE 93 128*  --   BUN 11 8  --   CREATININE 0.81 0.78 0.74  CALCIUM 9.4 9.2  --    LFT Recent Labs    05/21/17 0049  PROT 7.4  ALBUMIN 3.7  AST 21  ALT 10*  ALKPHOS 97  BILITOT 0.4   PT/INR No results for input(s): LABPROT, INR in the last 72 hours. Hepatitis Panel No results for input(s): HEPBSAG, HCVAB, HEPAIGM, HEPBIGM in the last 72 hours.    Studies/Results: Dg Abdomen 1 View  Result Date: 05/21/2017 CLINICAL DATA:  NG tube placement EXAM: ABDOMEN - 1 VIEW COMPARISON:  CT abdomen and pelvis 05/21/2017 FINDINGS: Enteric tube with tip in the left upper quadrant consistent with location in the body of  the stomach. Gas-filled mildly dilated small bowel suggesting obstruction or enteritis. Residual contrast material in the urinary tract and bladder. IMPRESSION: Enteric tube tip in the left upper quadrant consistent with location in the body of the stomach. Gas-filled dilated small bowel. Electronically Signed   By: Lucienne Capers M.D.   On: 05/21/2017 05:42   Ct Abdomen Pelvis W Contrast  Result Date: 05/21/2017 CLINICAL DATA:  Initial evaluation for acute severe abdominal pain, nausea, vomiting EXAM: CT ABDOMEN AND PELVIS WITH CONTRAST TECHNIQUE: Multidetector CT imaging of the abdomen and pelvis was performed using the standard protocol following bolus administration of intravenous contrast. CONTRAST:  100 cc of Isovue 370. COMPARISON:   Prior CT from 03/23/2017. FINDINGS: Lower chest: Scattered subsegmental atelectatic changes seen dependently within the visualized lung bases. Visualized lungs are otherwise clear. Bilateral breast implants partially visualized. Hepatobiliary: Liver demonstrates a normal contrast enhanced appearance. Gallbladder within normal limits. No biliary dilatation. Pancreas: Pancreas within normal limits. Spleen: Spleen within normal limits. Adrenals/Urinary Tract: Adrenal glands are normal. Kidneys equal in size with symmetric enhancement. No nephrolithiasis, hydronephrosis or focal enhancing renal mass. No hydroureter. Bladder within normal limits. Stomach/Bowel: Small hiatal hernia noted. Stomach otherwise unremarkable. Right lower quadrant ostomy in place. Multiple mildly prominent and dilated loops of small bowel seen within the mid and right abdomen. These measure up to approximately 3 cm in diameter with scattered internal air-fluid levels. These loops of bowel are dilated to the level of the ostomy (series 4, image 18). Finding raises the possibility for a partial and/or mild or early small bowel obstruction. No parastomal hernia. No other significant inflammatory changes about the small bowel. Residual colon is diffusely decompressed without abnormality. Vascular/Lymphatic: Normal intravascular enhancement seen throughout the intra-abdominal aorta. No aneurysm. Mesenteric vessels are patent proximally. Mild atherosclerosis. No adenopathy. Reproductive: Uterus is absent.  Residual ovaries unremarkable. Other: No free air. Trace free fluid within the right paracolic gutter. Musculoskeletal: No acute osseous abnormality. No worrisome lytic or blastic osseous lesions. IMPRESSION: 1. Multiple prominent and mildly dilated loops of small bowel with internal air-fluid levels scattered within the mid and right abdomen, abruptly tapering at the level of the right lower quadrant ostomy. Finding raises the possibility for a  partial and/or early or mild small bowel obstruction with transition point at the level of the ostomy. No parastomal hernia or other complication. 2. No other acute intra-abdominal or pelvic process. Electronically Signed   By: Jeannine Boga M.D.   On: 05/21/2017 03:08     Tye Savoy, NP-C @  05/21/2017, 3:08 PM  Pager number [phone]

## 2017-05-21 NOTE — ED Provider Notes (Addendum)
[facility] DEPT Provider Note   CSN: [id number] Arrival date & time: 05/21/17  0009     History   Chief Complaint Chief Complaint  Patient presents with  . Emesis  . Abdominal Cramping    HPI Sara Barnes is a 77 y.o. female.  The history is provided by the patient.  Emesis   This is a recurrent problem. The current episode started 12 to 24 hours ago. The problem occurs 5 to 10 times per day. The problem has not changed since onset.The emesis has an appearance of stomach contents. There has been no fever. Associated symptoms include abdominal pain. Pertinent negatives include no chills and no headaches. Risk factors: h/o  SBO.  Abdominal Cramping  This is a recurrent problem. The current episode started 12 to 24 hours ago. The problem occurs constantly. The problem has not changed since onset.Associated symptoms include abdominal pain. Pertinent negatives include no chest pain, no headaches and no shortness of breath. Nothing aggravates the symptoms. Nothing relieves the symptoms. Treatments tried: pain med but vomited it up immediately  The treatment provided no relief.    Past Medical History:  Diagnosis Date  . Allergy   . Anemia   . Basal cell carcinoma   . Blood transfusion without reported diagnosis   . Bowel perforation ([place])   . Cancer of lower-inner quadrant of female breast ([place]) 11/30/2014   left  . Cancer of lower-inner quadrant of left female breast ([place]) 11/30/2014  . Complication of anesthesia    anxiety post-op after ear surgeries  . Crohn's disease of large intestine with abscess ([place])   . Dental crowns present    recent root canal 11/2014  . Depression   . Family history of adverse reaction to anesthesia    pt's sister has hx. of post-op N/V  . GERD (gastroesophageal reflux disease)   . Indigestion   . PONV (postoperative nausea and vomiting)   . Runny nose 12/07/2014   clear drainage, per pt.  . Seasonal allergies   .  Ulcerative colitis   . Vitamin B12 deficiency     Patient Active Problem List   Diagnosis Date Noted  . Right ear pain 05/19/2017  . Dizziness 05/19/2017  . Perforated viscus 03/24/2017  . S/P laparoscopy 03/23/2017  . Chronic diastolic CHF (congestive heart failure) ([place]) 08/05/2016  . Atrial fibrillation with RVR ([place]) 06/24/2016  . Chest discomfort 06/21/2016  . Nausea and vomiting   . Small bowel obstruction ([place])   . Ileus ([place])   . Generalized anxiety disorder 02/14/2016  . 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 Hypokalemia   . Fever   . Other depression due to general medical condition   . Difficulty in walking, not elsewhere classified   . Acute blood loss anemia   . Absolute anemia   . Left upper quadrant pain   . Abdominal pain 01/07/2016  . GI bleed 01/07/2016  . IBS (irritable bowel syndrome)   . [place]   . Ulcerative pancolitis with rectal bleeding ([place])   . Diarrhea 12/24/2015  . Crohn disease ([place]) 12/24/2015  . Dehydration   . Ulcerative pancolitis with abscess ([place])   . Rectal pain   . Leucocytosis   . Cancer of lower-inner quadrant of left female breast ([place]) 11/30/2014  . Perirectal abscess 09/29/2013  . Depression 08/12/2006    Past Surgical History:  Procedure Laterality Date  . ABDOMINAL HYSTERECTOMY     partial  . APPENDECTOMY    .  BASAL CELL CARCINOMA EXCISION Left    nose  . BREAST ENHANCEMENT SURGERY    . BREAST LUMPECTOMY Left    age 21's  . BREAST LUMPECTOMY WITH RADIOACTIVE SEED AND SENTINEL LYMPH NODE BIOPSY Left 12/13/2014   Procedure: LEFT BREAST LUMPECTOMY WITH RADIOACTIVE SEED AND SENTINEL LYMPH NODE BIOPSY;  Surgeon: [name], MD;  Location: [facility];  Service: General;  Laterality: Left;  . COLONOSCOPY WITH PROPOFOL  05/28/2011; 06/28/2014  . FLEXIBLE SIGMOIDOSCOPY N/A 01/08/2016   Procedure: FLEXIBLE SIGMOIDOSCOPY;  Surgeon: [name], MD;  Location: [facility];  Service: Gastroenterology;   Laterality: N/A;  needs MAC due to pain and anxiety  . INCISION AND DRAINAGE ABSCESS  2018  . INCISION AND DRAINAGE PERIRECTAL ABSCESS N/A 09/28/2013   Procedure: IRRIGATION AND DEBRIDEMENT PERIANAL ABSCESS, proctoscopy;  Surgeon: Shann Medal, MD;  Location: [facility];  Service: General;  Laterality: N/A;  . INCISION AND DRAINAGE PERIRECTAL ABSCESS N/A 01/22/2016   Procedure: IRRIGATION AND DEBRIDEMENT PERIRECTAL ABSCESS;  Surgeon: Armandina Gemma, MD;  Location: [facility];  Service: General;  Laterality: N/A;  . INNER EAR SURGERY Bilateral   . LAPAROSCOPIC DIVERTED COLOSTOMY N/A 02/11/2016   Procedure: LAPAROSCOPIC DIVERTED ILEOSTOMY;  Surgeon: Johnathan Hausen, MD;  Location: [facility];  Service: General;  Laterality: N/A;  . LAPAROSCOPY N/A 03/23/2017   Procedure: LAPAROSCOPY DIAGNOSTIC, EXPLORATORY LAPAROTOMY,  BOWEL RESECTION;  Surgeon: Jackolyn Confer, MD;  Location: [facility];  Service: General;  Laterality: N/A;    OB History    No data available       Home Medications    Prior to Admission medications   Medication Sig Start Date End Date Taking? Authorizing Provider  acetaminophen (TYLENOL) 500 MG tablet You can take 2 tablets every 6 hours as needed for pain.  Use this as your primary pain control.  Use Tramadol as a last resort.  You can buy this over the counter at any drug store.    DO NOT TAKE MORE THAN 4000 MG OF TYLENOL PER DAY.  IT CAN HARM YOUR LIVER. 03/31/17   Earnstine Regal, PA-C  Certolizumab Pegol (CIMZIA PREFILLED) 2 X 200 MG/ML KIT Talk with Dr. Hilarie Fredrickson and he will tell you when to resume this medicine. 03/31/17   Earnstine Regal, PA-C  cyanocobalamin (,VITAMIN B-12,) 1000 MCG/ML injection INJECT 1 ML INTRAMUSCULARLY EVERY 30 DAYS 03/31/17   Pyrtle, Lajuan Lines, MD  Cyanocobalamin (VITAMIN B-12 IJ) Inject 1 mL as directed every 30 (thirty) days.    [provider]  famotidine (PEPCID) 40 MG tablet Take 1 tablet (40 mg total) by mouth 2 (two) times daily. Patient taking  differently: Take 40 mg by mouth daily as needed for heartburn or indigestion.  02/29/16   Meuth, Brooke A, PA-C  FLUoxetine (PROZAC) 20 MG capsule TAKE 1 CAPSULE TWICE DAILY 04/08/17   Pyrtle, Lajuan Lines, MD  meclizine (ANTIVERT) 25 MG tablet Take 1 tablet (25 mg total) by mouth 3 (three) times daily as needed for dizziness. 05/19/17   Roma Schanz R, DO  ondansetron (ZOFRAN ODT) 4 MG disintegrating tablet Take 1 tablet (4 mg total) by mouth every 6 (six) hours as needed for nausea or vomiting. 03/17/17   Pyrtle, Lajuan Lines, MD  predniSONE (DELTASONE) 5 MG tablet Take 5 mg by mouth daily.  06/05/16   [provider]  simvastatin (ZOCOR) 20 MG tablet Take 1 tablet (20 mg total) by mouth daily. Patient not taking: Reported on 04/13/2017 01/15/17   Etter Sjogren  Lyndal Pulley R, DO  traMADol (ULTRAM) 50 MG tablet Take 1-2 tablets (50-100 mg total) by mouth every 6 (six) hours as needed for moderate pain or severe pain (pain not relieved by Tylenol). 03/31/17   Earnstine Regal, PA-C  zolpidem (AMBIEN) 10 MG tablet TAKE ONE-HALF TABLET BY MOUTH TO 1 AT BEDTIME AS NEEDED FOR SLEEP 04/01/17   Pyrtle, Lajuan Lines, MD    Family History Family History  Problem Relation Age of Onset  . Prostate cancer Brother 35  . Kidney disease Brother   . Diabetes Brother   . Brain cancer Father   . Rheum arthritis Mother   . Anesthesia problems Sister        post-op N/V  . Emphysema Maternal Grandfather   . Asthma Sister   . Rheum arthritis Brother   . Colon cancer Neg Hx   . Esophageal cancer Neg Hx   . Breast cancer Neg Hx     Social History Social History   Tobacco Use  . Smoking status: Never Smoker  . Smokeless tobacco: Never Used  Substance Use Topics  . Alcohol use: Yes    Alcohol/week: 8.4 oz    Types: 14 Glasses of wine per week  . Drug use: No     Allergies   Codeine; Mesalamine; Other; Oxycodone-acetaminophen; and Sulfa antibiotics   Review of Systems Review of Systems  Constitutional:  Negative for chills.  Respiratory: Negative for shortness of breath.   Cardiovascular: Negative for chest pain.  Gastrointestinal: Positive for abdominal pain, nausea and vomiting.  Neurological: Negative for headaches.  All other systems reviewed and are negative.    Physical Exam Updated Vital Signs BP 125/84 (BP Location: Right Arm)   Pulse 89   Temp 97.7 F (36.5 C) (Oral)   Resp 20   Ht _0  (1.676 m)   Wt 66.7 kg (147 lb)   SpO2 98%   BMI 23.73 kg/m   Physical Exam  Constitutional: She is oriented to person, place, and time. She appears well-developed and well-nourished.  HENT:  Head: Normocephalic and atraumatic.  Mouth/Throat: No oropharyngeal exudate.  Eyes: Conjunctivae are normal. Pupils are equal, round, and reactive to light.  Neck: Normal range of motion. Neck supple. No JVD present.  Cardiovascular: Normal rate, regular rhythm, normal heart sounds and intact distal pulses.  Pulmonary/Chest: Effort normal and breath sounds normal. No stridor. She has no wheezes. She has no rales.  Abdominal: Soft. She exhibits distension. Bowel sounds are absent. There is tenderness. There is guarding.  Musculoskeletal: Normal range of motion.  Neurological: She is alert and oriented to person, place, and time.  Skin: Skin is warm and dry. Capillary refill takes less than 2 seconds. She is not diaphoretic.  Psychiatric: She has a normal mood and affect.     ED Treatments / Results  Labs (all labs ordered are listed, but only abnormal results are displayed)  Results for orders placed or performed during the hospital encounter of 05/21/17  Lipase, blood  Result Value Ref Range   Lipase 25 11 - 51 U/L  Comprehensive metabolic panel  Result Value Ref Range   Sodium 135 135 - 145 mmol/L   Potassium 3.7 3.5 - 5.1 mmol/L   Chloride 107 101 - 111 mmol/L   CO2 17 (L) 22 - 32 mmol/L   Glucose, Bld 128 (H) 65 - 99 mg/dL   BUN 8 6 - 20 mg/dL   Creatinine, Ser 0.78 0.44 - 1.00  mg/dL  Calcium 9.2 8.9 - 10.3 mg/dL   Total Protein 7.4 6.5 - 8.1 g/dL   Albumin 3.7 3.5 - 5.0 g/dL   AST 21 15 - 41 U/L   ALT 10 (L) 14 - 54 U/L   Alkaline Phosphatase 97 38 - 126 U/L   Total Bilirubin 0.4 0.3 - 1.2 mg/dL   GFR calc non Af Amer >60 >60 mL/min   GFR calc Af Amer >60 >60 mL/min   Anion gap 11 5 - 15  CBC  Result Value Ref Range   WBC 14.6 (H) 4.0 - 10.5 K/uL   RBC 4.85 3.87 - 5.11 MIL/uL   Hemoglobin 16.0 (H) 12.0 - 15.0 g/dL   HCT 46.7 (H) 36.0 - 46.0 %   MCV 96.3 78.0 - 100.0 fL   MCH 33.0 26.0 - 34.0 pg   MCHC 34.3 30.0 - 36.0 g/dL   RDW 13.6 11.5 - 15.5 %   Platelets 307 150 - 400 K/uL   No results found.    Procedures Procedures (including critical care time)  Medications Ordered in ED Medications  fentaNYL (SUBLIMAZE) injection 100 mcg (not administered)  ondansetron (ZOFRAN) injection 4 mg (4 mg Intravenous Given 05/21/17 0129)       Final Clinical Impressions(s) / ED Diagnoses  Case d/w Dr. Marlou Starks.  No be seen by [facility].  Will keep NPO.      Cristofer Yaffe, MD 05/21/17 [place] Abbot, Darrie Macmillan, MD 05/21/17 [id number]

## 2017-05-21 NOTE — ED Notes (Signed)
Bed: WA21 Expected date:  Expected time:  Means of arrival:  Comments: [place] Done

## 2017-05-21 NOTE — ED Notes (Signed)
Pt. Requested pain medicine prior to gastric tube placement. MD notified.

## 2017-05-21 NOTE — Progress Notes (Signed)
Patient approved of sister being in the room during the admission questioning

## 2017-05-21 NOTE — ED Notes (Signed)
Patient transported to CT 

## 2017-05-21 NOTE — ED Triage Notes (Signed)
Pt c/o severe abdominal pain and vomiting starting about 1 hour ago. Pt has an ileostomy and a hx of bowel blockages. A&Ox4.

## 2017-05-21 NOTE — H&P (Signed)
[facility] Surgery Admission Note  Sara Barnes [birth year]  [id number].    Requesting MD: April Palumbo Chief Complaint/Reason for Consult: SBO  HPI:  Sara Barnes is a 77yo female PMH Crohn's disease currently on 70m prednisone daily, [facility] acute onset of progressive abdominal pain. Patient underwent diverting ileostomy 02/11/16 by Dr. MHassell Doneand right colectomy 03/24/17 by Dr. RZella Richerfor perforated viscous. States that the pain started yesterday after breakfast. It is mostly centered around her ileostomy site but radiates diffusely about her abdomen. Describes the pain as crampy. Worse with movement or PO intake. She reports multiple episodes of nausea and vomiting. States that she had decreased ileostomy output yesterday, but within the last couple of hours being in the ED she has had a large amount of liquid output.  In the ED she had a CT scan that showed a small bowel obstruction with transition point at the level of the ostomy. NG tube has been placed. WBC 14.6, afebrile.  PMH significant for Crohns, h/o breast cancer Anticoagulants: none Nonsmoker  ROS: Review of Systems  Constitutional: Negative.   HENT: Negative.   Eyes: Negative.   Respiratory: Negative.   Cardiovascular: Negative.   Gastrointestinal: Positive for abdominal pain, constipation, nausea and vomiting.  Genitourinary: Negative.   Musculoskeletal: Negative.   Skin: Negative.   Neurological: Positive for dizziness.  Psychiatric/Behavioral: Negative.    All systems reviewed and otherwise negative except for as above  Family History  Problem Relation Age of Onset  . Prostate cancer Brother 53 . Kidney disease Brother   . Diabetes Brother   . Brain cancer Father   . Rheum arthritis Mother   . Anesthesia problems Sister        post-op N/V  . Emphysema Maternal Grandfather   . Asthma Sister   . Rheum arthritis Brother   . Colon cancer Neg Hx   .  Esophageal cancer Neg Hx   . Breast cancer Neg Hx     Past Medical History:  Diagnosis Date  . Allergy   . Anemia   . Basal cell carcinoma   . Blood transfusion without reported diagnosis   . [place]ation ([facility]   . [place] of [place] [place] 11/[place]   left  . Cancer of lower-inner quadrant of left female breast ([facility] 11/30/2014  . Complication of anesthesia    anxiety post-op after ear surgeries  . Cro[place]'s disease of large intestine with abscess ([place]   . Dental crowns present    recent root canal 11/2014  . Depression   . Family history of adverse reaction to anesthesia    pt's sister has hx. of post-op N/V  . GERD (gastroesophageal reflux disease)   . Indigestion   . PONV (postoperative nausea and vomiting)   . Runny nose 12/07/2014   clear drainage, per pt.  . Seasonal allergies   . Ulcerative colitis   . Vitamin B12 deficiency     Past Surgical History:  Procedure Laterality Date  . ABDOMINAL HYSTERECTOMY     partial  . APPENDECTOMY    . BASAL CELL CARCINOMA EXCISION Left    nose  . BREAST ENHANCEMENT SURGERY    . BREAST LUMPECTOMY Left    age 77's . BREAST LUMPECTOMY WITH RADIOACTIVE SEED AND SENTINEL LYMPH NODE BIOPSY Left 12/13/2014   Procedure: LEFT BREAST LUMPECTOMY WITH RADIOACTIVE SEED AND SENTINEL LYMPH NODE BIOPSY;  Surgeon: FStark Klein MD;  Location:  [facility];  Service: General;  Laterality: Left;  . COLONOSCOPY WITH PROPOFOL  05/28/2011; 06/28/2014  . FLEXIBLE SIGMOIDOSCOPY N/A 01/08/2016   Procedure: FLEXIBLE SIGMOIDOSCOPY;  Surgeon: [name], MD;  Location: [facility];  Service: Gastroenterology;  Laterality: N/A;  needs MAC due to pain and anxiety  . INCISION AND DRAINAGE ABSCESS  2018  . INCISION AND DRAINAGE PERIRECTAL ABSCESS N/A 09/28/2013   Procedure: IRRIGATION AND DEBRIDEMENT PERIANAL ABSCESS, proctoscopy;  Surgeon: Shann Medal, MD;  Location: [facility];  Service: General;  Laterality:  N/A;  . INCISION AND DRAINAGE PERIRECTAL ABSCESS N/A 01/22/2016   Procedure: IRRIGATION AND DEBRIDEMENT PERIRECTAL ABSCESS;  Surgeon: Armandina Gemma, MD;  Location: [facility];  Service: General;  Laterality: N/A;  . INNER EAR SURGERY Bilateral   . LAPAROSCOPIC DIVERTED COLOSTOMY N/A 02/11/2016   Procedure: LAPAROSCOPIC DIVERTED ILEOSTOMY;  Surgeon: Johnathan Hausen, MD;  Location: [facility];  Service: General;  Laterality: N/A;  . LAPAROSCOPY N/A 03/23/2017   Procedure: LAPAROSCOPY DIAGNOSTIC, EXPLORATORY LAPAROTOMY,  BOWEL RESECTION;  Surgeon: Jackolyn Confer, MD;  Location: [facility];  Service: General;  Laterality: N/A;    Social History:  reports that  has never smoked. she has never used smokeless tobacco. She reports that she drinks about 8.4 oz of alcohol per week. She reports that she does not use drugs.  Allergies:  Allergies  Allergen Reactions  . Codeine Nausea And Vomiting  . Mesalamine Nausea And Vomiting  . Other Itching    PERFUMED LOTIONS  Cant have MRI due to ear implant   . Oxycodone-Acetaminophen Rash    Itching & rash Tolerates Dilaudid  . Sulfa Antibiotics Rash     (Not in a hospital admission)  Prior to Admission medications   Medication Sig Start Date End Date Taking? Authorizing Provider  acetaminophen (TYLENOL) 500 MG tablet You can take 2 tablets every 6 hours as needed for pain.  Use this as your primary pain control.  Use Tramadol as a last resort.  You can buy this over the counter at any drug store.    DO NOT TAKE MORE THAN 4000 MG OF TYLENOL PER DAY.  IT CAN HARM YOUR LIVER. 03/31/17  Yes Earnstine Regal, PA-C  cyanocobalamin (,VITAMIN B-12,) 1000 MCG/ML injection INJECT 1 ML INTRAMUSCULARLY EVERY 30 DAYS 03/31/17  Yes Pyrtle, Lajuan Lines, MD  famotidine (PEPCID) 40 MG tablet Take 1 tablet (40 mg total) by mouth 2 (two) times daily. Patient taking differently: Take 40 mg by mouth daily as needed for heartburn or indigestion.  02/29/16  Yes Meuth, Brooke A, PA-C   FLUoxetine (PROZAC) 20 MG capsule TAKE 1 CAPSULE TWICE DAILY 04/08/17  Yes Pyrtle, Lajuan Lines, MD  meclizine (ANTIVERT) 25 MG tablet Take 1 tablet (25 mg total) by mouth 3 (three) times daily as needed for dizziness. 05/19/17  Yes Roma Schanz R, DO  ondansetron (ZOFRAN ODT) 4 MG disintegrating tablet Take 1 tablet (4 mg total) by mouth every 6 (six) hours as needed for nausea or vomiting. 03/17/17  Yes Pyrtle, Lajuan Lines, MD  predniSONE (DELTASONE) 5 MG tablet Take 5 mg by mouth daily.  06/05/16  Yes [provider]  traMADol (ULTRAM) 50 MG tablet Take 1-2 tablets (50-100 mg total) by mouth every 6 (six) hours as needed for moderate pain or severe pain (pain not relieved by Tylenol). 03/31/17  Yes Earnstine Regal, PA-C  zolpidem (AMBIEN) 10 MG tablet TAKE ONE-HALF TABLET BY MOUTH TO 1 AT BEDTIME AS NEEDED FOR SLEEP  04/01/17  Yes Pyrtle, Lajuan Lines, MD  Certolizumab Pegol (CIMZIA PREFILLED) 2 X 200 MG/ML KIT Talk with Dr. Hilarie Fredrickson and he will tell you when to resume this medicine. 03/31/17   Earnstine Regal, PA-C  simvastatin (ZOCOR) 20 MG tablet Take 1 tablet (20 mg total) by mouth daily. Patient not taking: Reported on 04/13/2017 01/15/17   Roma Schanz R, DO    Blood pressure 120/81, pulse 87, temperature 97.7 F (36.5 C), temperature source Oral, resp. rate 19, height _0  (1.676 m), weight 147 lb (66.7 kg), SpO2 92 %. Physical Exam: General: pleasant, WD/WN white female who is laying in bed in NAD HEENT: head is normocephalic, atraumatic.  Sclera are noninjected.  Pupils equal and round.  Ears and nose without any masses or lesions.  Mouth is pink and moist. Dentition fair Heart: regular, rate, and rhythm.  No obvious murmurs, gallops, or rubs noted.  Palpable pedal pulses bilaterally Lungs: CTAB, no wheezes, rhonchi, or rales noted.  Respiratory effort nonlabored Abd: well healed midline incision, soft, mild distension, mild global tenderness without rebound or guarding, +BS in all 4  quadrants, no masses, hernias, or organomegaly. RLQ ileostomy site pink with watery stool in bag MS: all 4 extremities are symmetrical with no cyanosis, clubbing, or edema. Skin: warm and dry with no masses, lesions, or rashes Psych: A&Ox3 with an appropriate affect. Neuro: cranial nerves grossly intact, extremity CSM intact bilaterally, normal speech  Results for orders placed or performed during the hospital encounter of 05/21/17 (from the past 48 hour(s))  Lipase, blood     Status: None   Collection Time: 05/21/17 12:49 AM  Result Value Ref Range   Lipase 25 11 - 51 U/L  Comprehensive metabolic panel     Status: Abnormal   Collection Time: 05/21/17 12:49 AM  Result Value Ref Range   Sodium 135 135 - 145 mmol/L   Potassium 3.7 3.5 - 5.1 mmol/L   Chloride 107 101 - 111 mmol/L   CO2 17 (L) 22 - 32 mmol/L   Glucose, Bld 128 (H) 65 - 99 mg/dL   BUN 8 6 - 20 mg/dL   Creatinine, Ser 0.78 0.44 - 1.00 mg/dL   Calcium 9.2 8.9 - 10.3 mg/dL   Total Protein 7.4 6.5 - 8.1 g/dL   Albumin 3.7 3.5 - 5.0 g/dL   AST 21 15 - 41 U/L   ALT 10 (L) 14 - 54 U/L   Alkaline Phosphatase 97 38 - 126 U/L   Total Bilirubin 0.4 0.3 - 1.2 mg/dL   GFR calc non Af Amer >60 >60 mL/min   GFR calc Af Amer >60 >60 mL/min    Comment: (NOTE) The eGFR has been calculated using the CKD EPI equation. This calculation has not been validated in all clinical situations. eGFR's persistently <60 mL/min signify possible Chronic Kidney Disease.    Anion gap 11 5 - 15  CBC     Status: Abnormal   Collection Time: 05/21/17 12:49 AM  Result Value Ref Range   WBC 14.6 (H) 4.0 - 10.5 K/uL   RBC 4.85 3.87 - 5.11 MIL/uL   Hemoglobin 16.0 (H) 12.0 - 15.0 g/dL   HCT 46.7 (H) 36.0 - 46.0 %   MCV 96.3 78.0 - 100.0 fL   MCH 33.0 26.0 - 34.0 pg   MCHC 34.3 30.0 - 36.0 g/dL   RDW 13.6 11.5 - 15.5 %   Platelets 307 150 - 400 K/uL  Urinalysis, Routine w reflex microscopic  Status: Abnormal   Collection Time: 05/21/17  4:26 AM   Result Value Ref Range   Color, Urine STRAW (A) YELLOW   APPearance CLEAR CLEAR   Specific Gravity, Urine >1.046 (H) 1.005 - 1.030   pH 5.0 5.0 - 8.0   Glucose, UA NEGATIVE NEGATIVE mg/dL   Hgb urine dipstick MODERATE (A) NEGATIVE   Bilirubin Urine NEGATIVE NEGATIVE   Ketones, ur NEGATIVE NEGATIVE mg/dL   Protein, ur NEGATIVE NEGATIVE mg/dL   Nitrite NEGATIVE NEGATIVE   Leukocytes, UA SMALL (A) NEGATIVE   RBC / HPF 0-5 0 - 5 RBC/hpf   WBC, UA 0-5 0 - 5 WBC/hpf   Bacteria, UA NONE SEEN NONE SEEN   Squamous Epithelial / LPF 0-5 (A) NONE SEEN   Hyaline Casts, UA PRESENT    Dg Abdomen 1 View  Result Date: 05/21/2017 CLINICAL DATA:  NG tube placement EXAM: ABDOMEN - 1 VIEW COMPARISON:  CT abdomen and pelvis 05/21/2017 FINDINGS: Enteric tube with tip in the left upper quadrant consistent with location in the body of the stomach. Gas-filled mildly dilated small bowel suggesting obstruction or enteritis. Residual contrast material in the urinary tract and bladder. IMPRESSION: Enteric tube tip in the left upper quadrant consistent with location in the body of the stomach. Gas-filled dilated small bowel. Electronically Signed   By: Lucienne Capers M.D.   On: 05/21/2017 05:42   Ct Abdomen Pelvis W Contrast  Result Date: 05/21/2017 CLINICAL DATA:  Initial evaluation for acute severe abdominal pain, nausea, vomiting EXAM: CT ABDOMEN AND PELVIS WITH CONTRAST TECHNIQUE: Multidetector CT imaging of the abdomen and pelvis was performed using the standard protocol following bolus administration of intravenous contrast. CONTRAST:  100 cc of Isovue 370. COMPARISON:  Prior CT from 03/23/2017. FINDINGS: Lower chest: Scattered subsegmental atelectatic changes seen dependently within the visualized lung bases. Visualized lungs are otherwise clear. Bilateral breast implants partially visualized. Hepatobiliary: Liver demonstrates a normal contrast enhanced appearance. Gallbladder within normal limits. No biliary  dilatation. Pancreas: Pancreas within normal limits. Spleen: Spleen within normal limits. Adrenals/Urinary Tract: Adrenal glands are normal. Kidneys equal in size with symmetric enhancement. No nephrolithiasis, hydronephrosis or focal enhancing renal mass. No hydroureter. Bladder within normal limits. Stomach/Bowel: Small hiatal hernia noted. Stomach otherwise unremarkable. Right lower quadrant ostomy in place. Multiple mildly prominent and dilated loops of small bowel seen within the mid and right abdomen. These measure up to approximately 3 cm in diameter with scattered internal air-fluid levels. These loops of bowel are dilated to the level of the ostomy (series 4, image 18). Finding raises the possibility for a partial and/or mild or early small bowel obstruction. No parastomal hernia. No other significant inflammatory changes about the small bowel. Residual colon is diffusely decompressed without abnormality. Vascular/Lymphatic: Normal intravascular enhancement seen throughout the intra-abdominal aorta. No aneurysm. Mesenteric vessels are patent proximally. Mild atherosclerosis. No adenopathy. Reproductive: Uterus is absent.  Residual ovaries unremarkable. Other: No free air. Trace free fluid within the right paracolic gutter. Musculoskeletal: No acute osseous abnormality. No worrisome lytic or blastic osseous lesions. IMPRESSION: 1. Multiple prominent and mildly dilated loops of small bowel with internal air-fluid levels scattered within the mid and right abdomen, abruptly tapering at the level of the right lower quadrant ostomy. Finding raises the possibility for a partial and/or early or mild small bowel obstruction with transition point at the level of the ostomy. No parastomal hernia or other complication. 2. No other acute intra-abdominal or pelvic process. Electronically Signed   By: Marland Kitchen  Jeannine Boga M.D.   On: 05/21/2017 03:08      Assessment/Plan H/o breast cancer GERD - IV PPI while  NPO Depression - hold prozac while NPO Dizziness - started meclizine 1/23  Crohns - h/o diverting ileostomy 02/11/16 by Dr. Hassell Done and right colectomy 03/24/17 by Dr. Zella Richer for perforated viscous. - followed by Dr. Hilarie Fredrickson - currently on 98m prednisone daily SBO at level of ostomy - CT scan shows a small bowel obstruction with transition point at the level of the ostomy.  - WBC 14.6, afebrile  ID - none VTE - SCDs, lovenox FEN - IVF, NPO/NGT Foley - none Follow up - TBD  Plan - Admit to med-surg. Contine NPO and NG tube for bowel decompression. Repeat XR in AM. Will consult GI for assistance with management of Crohns.  [facility] 05/21/2017, 8:05 AM Pager: [phone]: [phone]-Fri 7:00 am-4:30 pm Sat-Sun 7:00 am-11:30 am

## 2017-05-21 NOTE — Progress Notes (Signed)
Per PA IVF can be [facility] 46[facility]hr [facility]

## 2017-05-22 ENCOUNTER — Observation Stay (HOSPITAL_COMMUNITY): Payer: Medicare HMO

## 2017-05-22 DIAGNOSIS — Z833 Family history of diabetes mellitus: Secondary | ICD-10-CM | POA: Diagnosis not present

## 2017-05-22 DIAGNOSIS — K219 Gastro-esophageal reflux disease without esophagitis: Secondary | ICD-10-CM | POA: Diagnosis present

## 2017-05-22 DIAGNOSIS — Z885 Allergy status to narcotic agent status: Secondary | ICD-10-CM | POA: Diagnosis not present

## 2017-05-22 DIAGNOSIS — K5669 Other partial intestinal obstruction: Secondary | ICD-10-CM | POA: Diagnosis present

## 2017-05-22 DIAGNOSIS — I5032 Chronic diastolic (congestive) heart failure: Secondary | ICD-10-CM | POA: Diagnosis present

## 2017-05-22 DIAGNOSIS — Z882 Allergy status to sulfonamides status: Secondary | ICD-10-CM | POA: Diagnosis not present

## 2017-05-22 DIAGNOSIS — J9811 Atelectasis: Secondary | ICD-10-CM | POA: Diagnosis not present

## 2017-05-22 DIAGNOSIS — I4891 Unspecified atrial fibrillation: Secondary | ICD-10-CM | POA: Diagnosis present

## 2017-05-22 DIAGNOSIS — Z8042 Family history of malignant neoplasm of prostate: Secondary | ICD-10-CM | POA: Diagnosis not present

## 2017-05-22 DIAGNOSIS — Z841 Family history of disorders of kidney and ureter: Secondary | ICD-10-CM | POA: Diagnosis not present

## 2017-05-22 DIAGNOSIS — Z853 Personal history of malignant neoplasm of breast: Secondary | ICD-10-CM | POA: Diagnosis not present

## 2017-05-22 DIAGNOSIS — Z825 Family history of asthma and other chronic lower respiratory diseases: Secondary | ICD-10-CM | POA: Diagnosis not present

## 2017-05-22 DIAGNOSIS — K56609 Unspecified intestinal obstruction, unspecified as to partial versus complete obstruction: Secondary | ICD-10-CM | POA: Diagnosis present

## 2017-05-22 DIAGNOSIS — E46 Unspecified protein-calorie malnutrition: Secondary | ICD-10-CM | POA: Diagnosis present

## 2017-05-22 DIAGNOSIS — F411 Generalized anxiety disorder: Secondary | ICD-10-CM | POA: Diagnosis present

## 2017-05-22 DIAGNOSIS — I110 Hypertensive heart disease with heart failure: Secondary | ICD-10-CM | POA: Diagnosis present

## 2017-05-22 DIAGNOSIS — Z9049 Acquired absence of other specified parts of digestive tract: Secondary | ICD-10-CM | POA: Diagnosis not present

## 2017-05-22 DIAGNOSIS — Z888 Allergy status to other drugs, medicaments and biological substances status: Secondary | ICD-10-CM | POA: Diagnosis not present

## 2017-05-22 DIAGNOSIS — K5010 Crohn's disease of large intestine without complications: Secondary | ICD-10-CM | POA: Diagnosis present

## 2017-05-22 DIAGNOSIS — Z808 Family history of malignant neoplasm of other organs or systems: Secondary | ICD-10-CM | POA: Diagnosis not present

## 2017-05-22 DIAGNOSIS — K9413 Enterostomy malfunction: Secondary | ICD-10-CM | POA: Diagnosis present

## 2017-05-22 DIAGNOSIS — Z85828 Personal history of other malignant neoplasm of skin: Secondary | ICD-10-CM | POA: Diagnosis not present

## 2017-05-22 DIAGNOSIS — F329 Major depressive disorder, single episode, unspecified: Secondary | ICD-10-CM | POA: Diagnosis present

## 2017-05-22 DIAGNOSIS — Z8261 Family history of arthritis: Secondary | ICD-10-CM | POA: Diagnosis not present

## 2017-05-22 DIAGNOSIS — Z90710 Acquired absence of both cervix and uterus: Secondary | ICD-10-CM | POA: Diagnosis not present

## 2017-05-22 LAB — BASIC METABOLIC PANEL
ANION GAP: 8 (ref 5–15)
BUN: 7 mg/dL (ref 6–20)
CO2: 20 mmol/L — AB (ref 22–32)
Calcium: 9 mg/dL (ref 8.9–10.3)
Chloride: 108 mmol/L (ref 101–111)
Creatinine, Ser: 0.84 mg/dL (ref 0.44–1.00)
GFR calc Af Amer: >60 mL/min (ref >60)
GLUCOSE: 114 mg/dL — AB (ref 65–99)
POTASSIUM: 4.3 mmol/L (ref 3.5–5.1)
Sodium: 136 mmol/L (ref 135–145)

## 2017-05-22 LAB — CBC
HEMATOCRIT: 45.1 % (ref 36.0–46.0)
HEMOGLOBIN: 14.8 g/dL (ref 12.0–15.0)
MCH: 32.8 pg (ref 26.0–34.0)
MCHC: 32.8 g/dL (ref 30.0–36.0)
MCV: 100 fL (ref 78.0–100.0)
Platelets: 271 10*3/uL (ref 150–400)
RBC: 4.51 MIL/uL (ref 3.87–5.11)
RDW: 13.8 % (ref 11.5–15.5)
WBC: 9.9 10*3/uL (ref 4.0–10.5)

## 2017-05-22 MED ORDER — ACETAMINOPHEN 325 MG PO TABS
650.0000 mg | ORAL_TABLET | Freq: Four times a day (QID) | ORAL | Status: DC | PRN
  Administered 2017-05-31: 650 mg via ORAL
  Filled 2017-05-22: qty 2

## 2017-05-22 MED ORDER — FLUOXETINE HCL 20 MG PO CAPS
20.0000 mg | ORAL_CAPSULE | Freq: Two times a day (BID) | ORAL | Status: DC
  Administered 2017-05-22 – 2017-06-04 (×26): 20 mg via ORAL
  Filled 2017-05-22 (×27): qty 1

## 2017-05-22 MED ORDER — PREDNISONE 5 MG PO TABS
5.0000 mg | ORAL_TABLET | Freq: Every day | ORAL | Status: DC
  Administered 2017-05-22 – 2017-06-09 (×19): 5 mg via ORAL
  Filled 2017-05-22 (×19): qty 1

## 2017-05-22 MED ORDER — TRAMADOL HCL 50 MG PO TABS
50.0000 mg | ORAL_TABLET | Freq: Four times a day (QID) | ORAL | Status: DC | PRN
  Filled 2017-05-22: qty 1

## 2017-05-22 MED ORDER — MECLIZINE HCL 25 MG PO TABS: 25.0000 mg | ORAL_TABLET | Freq: Three times a day (TID) | ORAL | Status: DC | PRN

## 2017-05-22 MED ORDER — ZOLPIDEM TARTRATE 5 MG PO TABS
5.0000 mg | ORAL_TABLET | Freq: Every evening | ORAL | Status: DC | PRN
  Administered 2017-05-22 – 2017-06-08 (×18): 5 mg via ORAL
  Filled 2017-05-22 (×18): qty 1

## 2017-05-22 MED ORDER — PANTOPRAZOLE SODIUM 40 MG PO TBEC
40.0000 mg | DELAYED_RELEASE_TABLET | Freq: Every day | ORAL | Status: DC
  Administered 2017-05-22 – 2017-06-08 (×18): 40 mg via ORAL
  Filled 2017-05-22 (×18): qty 1

## 2017-05-22 NOTE — Progress Notes (Signed)
Pt having a lot more pain/cramping this afternoon.  Having nausea, also. Not had much to drink today, just a container and a small amount of tea. Will continue to monitor.

## 2017-05-22 NOTE — Progress Notes (Signed)
[facility] Progress Note     Subjective: CC-  States that she did not sleep much last night, but abdomen is feeling better. She is still a little sore but not painful. Denies n/v. Ileostomy output has slowed down from yesterday morning, but she is still having some stool and air output. XR this AM shows no bowel dilatation or air-fluid level to suggest bowel obstruction.  Objective: Vital signs in last 24 hours: Temp:  [97.8 F (36.6 C)-97.9 F (36.6 C)] 97.8 F (36.6 C) (01/25 0557) Pulse Rate:  [82-90] 88 (01/25 0557) Resp:  [18] 18 (01/25 0557) BP: (114-130)/(73-85) 126/77 (01/25 0557) SpO2:  [92 %-99 %] 99 % (01/25 0557) Last BM Date: 05/21/17  Intake/Output from previous day: 01/24 0701 - 01/25 0700 In: 1740 [P.O.:240; I.V.:1500] Out: 3000 [Urine:450; Emesis/NG output:1450; Stool:1000] Intake/Output this shift: No intake/output data recorded.  PE: Gen:  Alert, NAD, pleasant HEENT: EOM's intact, pupils equal and round Card:  RRR, no M/G/R heard Pulm:  effort normal Abd: Soft, NT/ND, +BS in all 4 quadrants, midline incision cdi, ileostomy pink with air and stool in bag Ext:  No erythema, edema, or tenderness BUE/BLE  Psych: A&Ox3  Skin: no rashes noted, warm and dry  Lab Results:  Recent Labs    05/21/17 1003 05/22/17 0457  WBC 11.6* 9.9  HGB 15.3* 14.8  HCT 46.1* 45.1  PLT 293 271   BMET Recent Labs    05/21/17 0049 05/21/17 1003 05/22/17 0457  NA 135  --  136  K 3.7  --  4.3  CL 107  --  108  CO2 17*  --  20*  GLUCOSE 128*  --  114*  BUN 8  --  7  CREATININE 0.78 0.74 0.84  CALCIUM 9.2  --  9.0   PT/INR No results for input(s): LABPROT, INR in the last 72 hours. CMP     Component Value Date/Time   NA 136 05/22/2017 0457   NA 138 12/06/2014 0804   K 4.3 05/22/2017 0457   K 4.1 12/06/2014 0804   CL 108 05/22/2017 0457   CO2 20 (L) 05/22/2017 0457   CO2 25 12/06/2014 0804   GLUCOSE 114 (H) 05/22/2017 0457   GLUCOSE 92  12/06/2014 0804   BUN 7 05/22/2017 0457   BUN 12.3 12/06/2014 0804   CREATININE 0.84 05/22/2017 0457   CREATININE 0.8 12/06/2014 0804   CALCIUM 9.0 05/22/2017 0457   CALCIUM 9.4 12/06/2014 0804   PROT 7.4 05/21/2017 0049   PROT 6.5 12/06/2014 0804   ALBUMIN 3.7 05/21/2017 0049   ALBUMIN 3.3 (L) 12/06/2014 0804   AST 21 05/21/2017 0049   AST 9 12/06/2014 0804   ALT 10 (L) 05/21/2017 0049   ALT 10 12/06/2014 0804   ALKPHOS 97 05/21/2017 0049   ALKPHOS 89 12/06/2014 0804   BILITOT 0.4 05/21/2017 0049   BILITOT 0.37 12/06/2014 0804   GFRNONAA >60 05/22/2017 0457   GFRAA >60 05/22/2017 0457   Lipase     Component Value Date/Time   LIPASE 25 05/21/2017 0049       Studies/Results: Dg Abdomen 1 View  Result Date: 05/21/2017 CLINICAL DATA:  NG tube placement EXAM: ABDOMEN - 1 VIEW COMPARISON:  CT abdomen and pelvis 05/21/2017 FINDINGS: Enteric tube with tip in the left upper quadrant consistent with location in the body of the stomach. Gas-filled mildly dilated small bowel suggesting obstruction or enteritis. Residual contrast material in the urinary tract and bladder. IMPRESSION: Enteric tube tip  in the left upper quadrant consistent with location in the body of the stomach. Gas-filled dilated small bowel. Electronically Signed   By: Lucienne Capers M.D.   On: 05/21/2017 05:42   Ct Abdomen Pelvis W Contrast  Result Date: 05/21/2017 CLINICAL DATA:  Initial evaluation for acute severe abdominal pain, nausea, vomiting EXAM: CT ABDOMEN AND PELVIS WITH CONTRAST TECHNIQUE: Multidetector CT imaging of the abdomen and pelvis was performed using the standard protocol following bolus administration of intravenous contrast. CONTRAST:  100 cc of Isovue 370. COMPARISON:  Prior CT from 03/23/2017. FINDINGS: Lower chest: Scattered subsegmental atelectatic changes seen dependently within the visualized lung bases. Visualized lungs are otherwise clear. Bilateral breast implants partially visualized.  Hepatobiliary: Liver demonstrates a normal contrast enhanced appearance. Gallbladder within normal limits. No biliary dilatation. Pancreas: Pancreas within normal limits. Spleen: Spleen within normal limits. Adrenals/Urinary Tract: Adrenal glands are normal. Kidneys equal in size with symmetric enhancement. No nephrolithiasis, hydronephrosis or focal enhancing renal mass. No hydroureter. Bladder within normal limits. Stomach/Bowel: Small hiatal hernia noted. Stomach otherwise unremarkable. Right lower quadrant ostomy in place. Multiple mildly prominent and dilated loops of small bowel seen within the mid and right abdomen. These measure up to approximately 3 cm in diameter with scattered internal air-fluid levels. These loops of bowel are dilated to the level of the ostomy (series 4, image 18). Finding raises the possibility for a partial and/or mild or early small bowel obstruction. No parastomal hernia. No other significant inflammatory changes about the small bowel. Residual colon is diffusely decompressed without abnormality. Vascular/Lymphatic: Normal intravascular enhancement seen throughout the intra-abdominal aorta. No aneurysm. Mesenteric vessels are patent proximally. Mild atherosclerosis. No adenopathy. Reproductive: Uterus is absent.  Residual ovaries unremarkable. Other: No free air. Trace free fluid within the right paracolic gutter. Musculoskeletal: No acute osseous abnormality. No worrisome lytic or blastic osseous lesions. IMPRESSION: 1. Multiple prominent and mildly dilated loops of small bowel with internal air-fluid levels scattered within the mid and right abdomen, abruptly tapering at the level of the right lower quadrant ostomy. Finding raises the possibility for a partial and/or early or mild small bowel obstruction with transition point at the level of the ostomy. No parastomal hernia or other complication. 2. No other acute intra-abdominal or pelvic process. Electronically Signed   By:  Jeannine Boga M.D.   On: 05/21/2017 03:08   Dg Abd Portable 1v  Result Date: 05/22/2017 CLINICAL DATA:  Small bowel obstruction EXAM: PORTABLE ABDOMEN - 1 VIEW COMPARISON:  May 21, 2017 FINDINGS: Nasogastric tube tip and side port are in the distal stomach. There are areas of postoperative change bilaterally. There is no appreciable bowel dilatation or air-fluid level to suggest bowel obstruction. No free air. There is contrast in the colon. IMPRESSION: Nasogastric tube tip and side port in distal stomach. Areas of postoperative change. No bowel dilatation or air-fluid level to suggest bowel obstruction. No free air demonstrable. Electronically Signed   By: Lowella Grip III M.D.   On: 05/22/2017 07:35    Anti-infectives: Anti-infectives (From admission, onward)   None       Assessment/Plan H/o breast cancer GERD - PPI Depression - prozac Dizziness - started meclizine 1/23, PRN  Crohns - h/o diverting ileostomy 02/11/16 by Dr. Hassell Done and right colectomy 03/24/17 by Dr. Zella Richer for perforated viscous. - followed by Dr. Hilarie Fredrickson, appreciate GI consult - currently on 93m prednisone daily SBO at level of ostomy - CT scan shows a small bowel obstruction with transition point at the level  of the ostomy.  - WBC 9.9, afebrile - XR this AM shows no bowel dilatation or air-fluid level to suggest bowel obstruction  ID - none VTE - SCDs, lovenox FEN - IVF,  Clamp NG tube and give clear liquids Foley - none Follow up - TBD  Plan - Clamp NG tube and give clear liquids. Encourage OOB today. Restart home meds. Will recheck later today and possibly d/c NG tube.   LOS: 0 days    Wellington Hampshire , [facility] 05/22/2017, 8:29 AM Pager: [phone] Consults: [phone] Mon-Fri 7:00 am-4:30 pm Sat-Sun 7:00 am-11:30 am

## 2017-05-23 ENCOUNTER — Inpatient Hospital Stay (HOSPITAL_COMMUNITY): Payer: Medicare HMO

## 2017-05-23 MED ORDER — TRAMADOL HCL 50 MG PO TABS
50.0000 mg | ORAL_TABLET | Freq: Four times a day (QID) | ORAL | Status: DC | PRN
  Administered 2017-05-23 – 2017-05-26 (×2): 50 mg via ORAL
  Filled 2017-05-23 (×2): qty 1

## 2017-05-23 NOTE — Progress Notes (Signed)
SBO  Subjective: Had quite a bit of pain with tube clamped.  No vomiting  Objective: Vital signs in last 24 hours: Temp:  [97.8 F (36.6 C)-98.3 F (36.8 C)] 97.8 F (36.6 C) (01/26 0606) Pulse Rate:  [77-109] 87 (01/26 0606) Resp:  [18] 18 (01/26 0606) BP: (117-136)/(74-80) 136/78 (01/26 0606) SpO2:  [95 %-97 %] 97 % (01/26 0606) Last BM Date: 05/22/17  Intake/Output from previous day: 01/25 0701 - 01/26 0700 In: 560 [P.O.:60; I.V.:500] Out: 816 [Urine:391; Stool:425] Intake/Output this shift: No intake/output data recorded.  General appearance: alert and cooperative GI: normal findings: soft, non-tender Ostomy: slightly strictured  Lab Results:  No results found for this or any previous visit (from the past 24 hour(s)).   Studies/Results Radiology     MEDS, Scheduled . enoxaparin (LOVENOX) injection  40 mg Subcutaneous Q24H  . FLUoxetine  20 mg Oral BID  . pantoprazole  40 mg Oral QHS  . 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     Plan: Foley placed within ostomy.  NG with min residuals.  Will try clears and d/c ng if she tolerates this.  If this resolves her symptoms, she will most likely need ileostomy dilated in OR prior to d/c   LOS: 1 day    Rosario Adie, MD [facility], [place]   05/23/2017 9:35 AM

## 2017-05-23 NOTE — Progress Notes (Signed)
Pt tolerating clears. NGT discontinued per order.

## 2017-05-24 LAB — BASIC METABOLIC PANEL
Anion gap: 7 (ref 5–15)
BUN: 7 mg/dL (ref 6–20)
CALCIUM: 8.9 mg/dL (ref 8.9–10.3)
CO2: 20 mmol/L — AB (ref 22–32)
CREATININE: 0.77 mg/dL (ref 0.44–1.00)
Chloride: 107 mmol/L (ref 101–111)
GFR calc non Af Amer: >60 mL/min (ref >60)
Glucose, Bld: 87 mg/dL (ref 65–99)
Potassium: 3.9 mmol/L (ref 3.5–5.1)
SODIUM: 134 mmol/L — AB (ref 135–145)

## 2017-05-24 NOTE — Progress Notes (Signed)
SBO  Subjective: Pain much better with catheter across ostomy site, tolerating clears with min pain  Objective: Vital signs in last 24 hours: Temp:  [98 F (36.7 C)-98.4 F (36.9 C)] 98 F (36.7 C) (01/27 0551) Pulse Rate:  [71-84] 72 (01/27 0551) Resp:  [16-18] 16 (01/27 0551) BP: (110-114)/(70-72) 110/72 (01/27 0551) SpO2:  [97 %-100 %] 100 % (01/27 0551) Last BM Date: 05/22/17  Intake/Output from previous day: 01/26 0701 - 01/27 0700 In: 2950 [P.O.:1200; I.V.:1750] Out: 2050 [Urine:500; HENID:7824] Intake/Output this shift: No intake/output data recorded.  General appearance: alert and cooperative GI: normal findings: soft, non-tender Ostomy: pink viable, foley in place  Lab Results:  Results for orders placed or performed during the hospital encounter of 05/21/17 (from the past 24 hour(s))  Basic metabolic panel     Status: Abnormal   Collection Time: 05/24/17  4:45 AM  Result Value Ref Range   Sodium 134 (L) 135 - 145 mmol/L   Potassium 3.9 3.5 - 5.1 mmol/L   Chloride 107 101 - 111 mmol/L   CO2 20 (L) 22 - 32 mmol/L   Glucose, Bld 87 65 - 99 mg/dL   BUN 7 6 - 20 mg/dL   Creatinine, Ser 0.77 0.44 - 1.00 mg/dL   Calcium 8.9 8.9 - 10.3 mg/dL   GFR calc non Af Amer >60 >60 mL/min   GFR calc Af Amer >60 >60 mL/min   Anion gap 7 5 - 15     Studies/Results Radiology     MEDS, Scheduled . enoxaparin (LOVENOX) injection  40 mg Subcutaneous Q24H  . FLUoxetine  20 mg Oral BID  . pantoprazole  40 mg Oral QHS  . 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     Plan: Foley within ostomy for decopression.  NG out.  Will cont clears.  she will most likely need ileostomy dilated in OR in order to be able to advance her diet   LOS: 2 days    Sara Adie, MD [facility], [place]   05/24/2017 8:40 AM

## 2017-05-25 ENCOUNTER — Inpatient Hospital Stay (HOSPITAL_COMMUNITY): Payer: Medicare HMO | Admitting: Certified Registered Nurse Anesthetist

## 2017-05-25 ENCOUNTER — Encounter (HOSPITAL_COMMUNITY): Admission: EM | Disposition: A | Discharge status: Home Health | Payer: Self-pay | Source: Home / Self Care

## 2017-05-25 ENCOUNTER — Inpatient Hospital Stay (HOSPITAL_COMMUNITY): Payer: Medicare HMO

## 2017-05-25 HISTORY — PX: OTHER SURGICAL HISTORY: SHX169

## 2017-05-25 HISTORY — PX: ILEOSTOMY: SHX1783

## 2017-05-25 SURGERY — CREATION, ILEOSTOMY
Anesthesia: General

## 2017-05-25 MED ORDER — PROPOFOL 10 MG/ML IV BOLUS
INTRAVENOUS | Status: AC
  Filled 2017-05-25: qty 40

## 2017-05-25 MED ORDER — FENTANYL CITRATE (PF) 100 MCG/2ML IJ SOLN
INTRAMUSCULAR | Status: DC | PRN
  Administered 2017-05-25 (×2): 25 ug via INTRAVENOUS

## 2017-05-25 MED ORDER — LIDOCAINE 2% (20 MG/ML) 5 ML SYRINGE
INTRAMUSCULAR | Status: DC | PRN
  Administered 2017-05-25: 100 mg via INTRAVENOUS

## 2017-05-25 MED ORDER — LIP MEDEX EX OINT
TOPICAL_OINTMENT | CUTANEOUS | Status: AC
  Administered 2017-05-25: 13:00:00
  Filled 2017-05-25: qty 7

## 2017-05-25 MED ORDER — FENTANYL CITRATE (PF) 100 MCG/2ML IJ SOLN
INTRAMUSCULAR | Status: AC
  Filled 2017-05-25: qty 2

## 2017-05-25 MED ORDER — PROMETHAZINE HCL 25 MG/ML IJ SOLN: 6.2500 mg | INTRAMUSCULAR | Status: DC | PRN

## 2017-05-25 MED ORDER — LACTATED RINGERS IV SOLN
INTRAVENOUS | Status: DC
  Administered 2017-05-25: 11:00:00 via INTRAVENOUS

## 2017-05-25 MED ORDER — LIDOCAINE 2% (20 MG/ML) 5 ML SYRINGE
INTRAMUSCULAR | Status: AC
  Filled 2017-05-25: qty 5

## 2017-05-25 MED ORDER — ONDANSETRON HCL 4 MG/2ML IJ SOLN
4.0000 mg | Freq: Three times a day (TID) | INTRAMUSCULAR | Status: DC
  Administered 2017-05-25 – 2017-05-27 (×5): 4 mg via INTRAVENOUS
  Filled 2017-05-25 (×5): qty 2

## 2017-05-25 MED ORDER — PROPOFOL 10 MG/ML IV BOLUS
INTRAVENOUS | Status: DC | PRN
  Administered 2017-05-25: 150 mg via INTRAVENOUS

## 2017-05-25 MED ORDER — KCL IN DEXTROSE-NACL 20-5-0.45 MEQ/L-%-% IV SOLN
INTRAVENOUS | Status: DC
  Administered 2017-05-25 – 2017-05-27 (×3): via INTRAVENOUS
  Filled 2017-05-25 (×4): qty 1000

## 2017-05-25 MED ORDER — FENTANYL CITRATE (PF) 100 MCG/2ML IJ SOLN: 25.0000 ug | INTRAMUSCULAR | Status: DC | PRN

## 2017-05-25 MED ORDER — PROMETHAZINE HCL 25 MG/ML IJ SOLN
12.5000 mg | Freq: Four times a day (QID) | INTRAMUSCULAR | Status: DC | PRN
  Administered 2017-05-26 – 2017-05-27 (×2): 12.5 mg via INTRAVENOUS
  Filled 2017-05-25 (×2): qty 1

## 2017-05-25 MED ORDER — 0.9 % SODIUM CHLORIDE (POUR BTL) OPTIME
TOPICAL | Status: DC | PRN
  Administered 2017-05-25: 1000 mL

## 2017-05-25 SURGICAL SUPPLY — 33 items
BLADE HEX COATED 2.75 (ELECTRODE) ×3 IMPLANT
BLADE SURG SZ10 CARB STEEL (BLADE) ×3 IMPLANT
CATH FOLEY 2WAY SLVR  5CC 18FR (CATHETERS) ×2
CATH FOLEY 2WAY SLVR 5CC 18FR (CATHETERS) ×1 IMPLANT
DRAPE LAPAROSCOPIC ABDOMINAL (DRAPES) ×3 IMPLANT
ELECT REM PT RETURN 15FT ADLT (MISCELLANEOUS) ×3 IMPLANT
GAUZE SPONGE 4X4 12PLY STRL (GAUZE/BANDAGES/DRESSINGS) ×3 IMPLANT
GLOVE BIOGEL PI IND STRL 7.0 (GLOVE) ×1 IMPLANT
GLOVE BIOGEL PI INDICATOR 7.0 (GLOVE) ×2
GLOVE ECLIPSE 6.5 STRL STRAW (GLOVE) ×6 IMPLANT
GOWN STRL REUS W/TWL 2XL LVL3 (GOWN DISPOSABLE) ×3 IMPLANT
GOWN STRL REUS W/TWL LRG LVL3 (GOWN DISPOSABLE) IMPLANT
GOWN STRL REUS W/TWL XL LVL3 (GOWN DISPOSABLE) IMPLANT
HANDLE SUCTION POOLE (INSTRUMENTS) IMPLANT
KIT BASIN OR (CUSTOM PROCEDURE TRAY) ×3 IMPLANT
NEEDLE SPNL 22GX7 QUINCKE BK (NEEDLE) ×3 IMPLANT
PACK GENERAL/GYN (CUSTOM PROCEDURE TRAY) ×3 IMPLANT
POUCH OSTOMY 2 PC DRNBL 2.25 (WOUND CARE) ×1 IMPLANT
POUCH OSTOMY DRNBL 2 1/4 (WOUND CARE) ×2
SEALER TISSUE X1 CVD JAW (INSTRUMENTS) IMPLANT
SUCTION POOLE HANDLE (INSTRUMENTS)
SUT SILK 2 0 (SUTURE)
SUT SILK 2 0 SH CR/8 (SUTURE) IMPLANT
SUT SILK 2-0 18XBRD TIE 12 (SUTURE) IMPLANT
SUT SILK 2-0 30XBRD TIE 12 (SUTURE) IMPLANT
SUT SILK 3 0 (SUTURE)
SUT SILK 3 0 SH CR/8 (SUTURE) IMPLANT
SUT SILK 3-0 18XBRD TIE 12 (SUTURE) IMPLANT
SUT VIC AB 2-0 SH 18 (SUTURE) IMPLANT
SYR CONTROL 10ML LL (SYRINGE) ×3 IMPLANT
TOWEL OR 17X26 10 PK STRL BLUE (TOWEL DISPOSABLE) ×3 IMPLANT
TOWEL OR NON WOVEN STRL DISP B (DISPOSABLE) ×3 IMPLANT
YANKAUER SUCT BULB TIP NO VENT (SUCTIONS) IMPLANT

## 2017-05-25 NOTE — Anesthesia Preprocedure Evaluation (Signed)
Anesthesia Evaluation  Patient identified by MRN, date of birth, ID band Patient awake    Reviewed: Allergy & Precautions, H&P , NPO status , Patient's Chart, lab work & pertinent test results  History of Anesthesia Complications (+) PONV and history of anesthetic complications  Airway Mallampati: II   Neck ROM: full    Dental no notable dental hx. (+) Dental Advisory Given   Pulmonary    breath sounds clear to auscultation       Cardiovascular negative cardio ROS   Rhythm:regular Rate:Normal     Neuro/Psych PSYCHIATRIC DISORDERS Anxiety Depression negative neurological ROS     GI/Hepatic Neg liver ROS, PUD, GERD  ,  Endo/Other  negative endocrine ROS  Renal/GU negative Renal ROS     Musculoskeletal   Abdominal   Peds  Hematology   Anesthesia Other Findings   Reproductive/Obstetrics                             Anesthesia Physical  Anesthesia Plan  ASA: II and emergent  Anesthesia Plan: General   Post-op Pain Management:    Induction: Intravenous  PONV Risk Score and Plan: 4 or greater and Ondansetron, Dexamethasone and Treatment may vary due to age or medical condition  Airway Management Planned: Mask and LMA  Additional Equipment:   Intra-op Plan:   Post-operative Plan: Extubation in OR  Informed Consent: I have reviewed the patients History and Physical, chart, labs and discussed the procedure including the risks, benefits and alternatives for the proposed anesthesia with the patient or authorized representative who has indicated his/her understanding and acceptance.   Dental advisory given  Plan Discussed with: CRNA, Anesthesiologist and Surgeon  Anesthesia Plan Comments:         Anesthesia Quick Evaluation

## 2017-05-25 NOTE — Anesthesia Procedure Notes (Signed)
Date/Time: 05/25/2017 11:22 AM Performed by: Claudia Desanctis, CRNA Patient Re-evaluated:Patient Re-evaluated prior to induction Oxygen Delivery Method: Circle system utilized Preoxygenation: Pre-oxygenation with 100% oxygen Induction Type: IV induction Ventilation: Mask ventilation without difficulty and Mask ventilation throughout procedure

## 2017-05-25 NOTE — Transfer of Care (Signed)
Immediate Anesthesia Transfer of Care Note  Patient: Sara Barnes  Procedure(s) Performed: DILATION OF ILEOSTOMY (N/A )  Patient Location: PACU  Anesthesia Type:General  Level of Consciousness: sedated and patient cooperative  Airway & Oxygen Therapy: Patient Spontanous Breathing and Patient connected to face mask  Post-op Assessment: Report given to RN and Post -op Vital signs reviewed and stable  Post vital signs: Reviewed and stable  Last Vitals:  Vitals:   05/24/17 2200 05/25/17 0622  BP: 117/70 103/66  Pulse: 61 (!) 106  Resp: 18 16  Temp: 36.8 C   SpO2: 100% 98%    Last Pain:  Vitals:   05/25/17 0800  TempSrc:   PainSc: 0-No pain      Patients Stated Pain Goal: 3 (74/25/95 6387)  Complications: No apparent anesthesia complications

## 2017-05-25 NOTE — Progress Notes (Signed)
Pt checked frequently, rested more comfortably at long intervals with eyes closed. Noted odor in room, further investigation led to pt's ostomy bag being detached, and large amount of liquid stool in bed with patient. Ostomy appliance changed, new applied, pt tolerated well. Bath given and bed linens changed. Pt reports, "I guess I had too much citrus yesterday." Remains NPO

## 2017-05-25 NOTE — Op Note (Signed)
Preoperative diagnosis: sbo due to stenosis at ileostomy site Postoperative diagnosis: same as above Procedure: dilation of ileostomy  Surgeon Dr Serita Grammes Anesthesia general EBL none Complications none Specimens none Drains none dispo to recovery stable  Indications: this is a 76 yof with Crohns disease who has undergone right colectomy after perforation previously with end ileostomy . This was end of November. She now has a bowel obstruction that appears to be at site of ileostomy.  She had foley placed with decompression by one of my partners. I discussed going to OR and attempted dilation as this does appear mechanical  Procedure: After informed consent obtained patient taken to OR. She underwent mask anesthesia.  A timeout was performed.  I then removed old appliance and foley that was in place. I attempted first to place my index finger but this would not pass. I then used my fifth digit and was able to insert this. It is very tight about 1-2 cm inside the ileostomy. I was able to gently pass this digit and left in place. I then was able to pass my index finger.  There was no pressure. There was return of a lot of clear fluid and mucus.  I then placed an 18 Fr foley catheter and replaced the appliance. She tolerated this well and was transferred to recovery

## 2017-05-25 NOTE — Care Management Important Message (Signed)
Important Message  Patient Details  Name: [name] MRN: [id number] Date of Birth: 1940/09/20   Medicare Important Message Given:  Yes    Kerin Salen 05/25/2017, 11:11 AMImportant Message  Patient Details  Name: [name] MRN: [id number] Date of Birth: Jul 06, 1940   Medicare Important Message Given:  Yes    Kerin Salen 05/25/2017, 11:10 AM

## 2017-05-25 NOTE — Progress Notes (Signed)
Pt instructed to remain NPO after midnight due to upcoming procedure in the morning. Pt acknowledged understandings, all fluid and food items removed from patient's reach.

## 2017-05-25 NOTE — Progress Notes (Signed)
Pt c/o nausea and vomiting. Moderate amount of clear emesis noted in basin at bedside. Pt instructed on NPO status and acknowledges. Medicated as documented for N/V.

## 2017-05-25 NOTE — Anesthesia Postprocedure Evaluation (Signed)
Anesthesia Post Note  Patient: Sara Barnes  Procedure(s) Performed: DILATION OF ILEOSTOMY (N/A )     Patient location during evaluation: PACU Anesthesia Type: General Level of consciousness: sedated Pain management: pain level controlled Vital Signs Assessment: post-procedure vital signs reviewed and stable Respiratory status: spontaneous breathing and respiratory function stable Cardiovascular status: stable Postop Assessment: no apparent nausea or vomiting Anesthetic complications: no    Last Vitals:  Vitals:   05/25/17 1350 05/25/17 1459  BP: 110/66 97/61  Pulse: 84 83  Resp: 16 16  Temp: 36.8 C 36.5 C  SpO2: 99% 99%    Last Pain:  Vitals:   05/25/17 1459  TempSrc: Oral  PainSc:                  Aradhana Gin DANIEL

## 2017-05-25 NOTE — Progress Notes (Signed)
   Subjective/Chief Complaint: N/v overnight, some liquid in stoma bag, doesn't feel well  Objective: Vital signs in last 24 hours: Temp:  [98.2 F (36.8 C)-98.8 F (37.1 C)] 98.2 F (36.8 C) (01/27 2200) Pulse Rate:  [61-106] 106 (01/28 0622) Resp:  [16-18] 16 (01/28 0622) BP: (98-117)/(66-74) 103/66 (01/28 0622) SpO2:  [98 %-100 %] 98 % (01/28 0622) Last BM Date: 05/22/17  Intake/Output from previous day: 01/27 0701 - 01/28 0700 In: 1244.2 [P.O.:600; I.V.:644.2] Out: 1900 [Stool:1900] Intake/Output this shift: No intake/output data recorded.  General appearance: no distress Resp: clear to auscultation bilaterally Cardio: regular rate and rhythm GI: abnormal findings:  soft nontender wound healed stoma pink with some liquid in bag, no stool, no air  Lab Results:  No results for input(s): WBC, HGB, HCT, PLT in the last 72 hours. BMET Recent Labs    05/24/17 0445  NA 134*  K 3.9  CL 107  CO2 20*  GLUCOSE 87  BUN 7  CREATININE 0.77  CALCIUM 8.9   PT/INR No results for input(s): LABPROT, INR in the last 72 hours. ABG No results for input(s): PHART, HCO3 in the last 72 hours.  Invalid input(s): PCO2, PO2  Studies/Results: No results found.  Anti-infectives: Anti-infectives (From admission, onward)   None      Assessment/Plan: Elap with perforated viscus after colonoscopy 03/23/17, right colectomy with end ileostomy- Rosenbower History Crohns disease  Crohns - followed by Dr. Hilarie Fredrickson, appreciate GI consult - currently on 30m prednisone daily SBO at level of ostomy - CT scan shows a small bowel obstruction with transition point at the level of the ostomy.  - afebrile - will check xray this am due to symptoms overnight, may need ng replaced especially if sedating her later today - I think after reviewing scans and in conversation with Dr TMarcello Mooresshe would benefit from gentle attempt to dilate stoma down through level of fascia. I discussed risk that this  will not work. Discussed risk of perforation.  We discussed that this may need to be done serially and there is a chance that she might need another operation at some point.  If she does need another operation would try to give some more time and might need to be on tpn.  ID - none VTE -SCDs, lovenox FEN -IVF       MRolm Bookbinder1/28/2019

## 2017-05-26 ENCOUNTER — Encounter (HOSPITAL_COMMUNITY): Payer: Self-pay | Admitting: General Surgery

## 2017-05-26 MED ORDER — ENSURE ENLIVE PO LIQD
237.0000 mL | Freq: Two times a day (BID) | ORAL | Status: DC
  Administered 2017-05-26: 237 mL via ORAL

## 2017-05-26 NOTE — Progress Notes (Signed)
[facility] Progress Note  1 Day Post-Op  Subjective: CC: feeling sleepy Patient sleepy this AM but awakens appropriately. She denies abdominal pain, nausea or vomiting. Has had more stool and gas output in ostomy. Tolerated CLD. UOP good. VSS.   Objective: Vital signs in last 24 hours: Temp:  [97.6 F (36.4 C)-98.2 F (36.8 C)] 97.6 F (36.4 C) (01/29 0500) Pulse Rate:  [72-103] 72 (01/29 0500) Resp:  [11-23] 16 (01/28 2100) BP: (97-114)/(61-80) 112/64 (01/29 0500) SpO2:  [96 %-100 %] 97 % (01/29 0500) Last BM Date: 05/24/17  Intake/Output from previous day: 01/28 0701 - 01/29 0700 In: 1294.2 [P.O.:240; I.V.:1054.2] Out: 480 [Stool:480] Intake/Output this shift: No intake/output data recorded.  PE: Gen:  Alert, NAD, pleasant Card:  Regular rate and rhythm, pedal pulses 2+ BL Pulm:  Normal effort, clear to auscultation bilaterally Abd: Soft, non-tender, non-distended, bowel sounds hyperactive, no HSM, midline incision well healing, stoma pink with liquid stool and gas in ostomy pouch Skin: warm and dry, no rashes  Psych: A&Ox3   Lab Results:  No results for input(s): WBC, HGB, HCT, PLT in the last 72 hours. BMET Recent Labs    05/24/17 0445  NA 134*  K 3.9  CL 107  CO2 20*  GLUCOSE 87  BUN 7  CREATININE 0.77  CALCIUM 8.9   PT/INR No results for input(s): LABPROT, INR in the last 72 hours. CMP     Component Value Date/Time   NA 134 (L) 05/24/2017 0445   NA 138 12/06/2014 0804   K 3.9 05/24/2017 0445   K 4.1 12/06/2014 0804   CL 107 05/24/2017 0445   CO2 20 (L) 05/24/2017 0445   CO2 25 12/06/2014 0804   GLUCOSE 87 05/24/2017 0445   GLUCOSE 92 12/06/2014 0804   BUN 7 05/24/2017 0445   BUN 12.3 12/06/2014 0804   CREATININE 0.77 05/24/2017 0445   CREATININE 0.8 12/06/2014 0804   CALCIUM 8.9 05/24/2017 0445   CALCIUM 9.4 12/06/2014 0804   PROT 7.4 05/21/2017 0049   PROT 6.5 12/06/2014 0804   ALBUMIN 3.7 05/21/2017 0049   ALBUMIN 3.3 (L)  12/06/2014 0804   AST 21 05/21/2017 0049   AST 9 12/06/2014 0804   ALT 10 (L) 05/21/2017 0049   ALT 10 12/06/2014 0804   ALKPHOS 97 05/21/2017 0049   ALKPHOS 89 12/06/2014 0804   BILITOT 0.4 05/21/2017 0049   BILITOT 0.37 12/06/2014 0804   GFRNONAA >60 05/24/2017 0445   GFRAA >60 05/24/2017 0445   Lipase     Component Value Date/Time   LIPASE 25 05/21/2017 0049       Studies/Results: Dg Abd 1 View  Result Date: 05/25/2017 CLINICAL DATA:  Small bowel obstruction. EXAM: ABDOMEN - 1 VIEW COMPARISON:  Radiograph of May 23, 2017. FINDINGS: The bowel gas pattern is normal. No radio-opaque calculi are seen. Colostomy seen in right lower quadrant. IMPRESSION: No evidence of bowel obstruction or ileus. Electronically Signed   By: Marijo Conception, M.D.   On: 05/25/2017 08:44    Anti-infectives: Anti-infectives (From admission, onward)   None       Assessment/Plan Elap with perforated viscus after colonoscopy 03/23/17, right colectomy with end ileostomy- Rosenbower History Crohns disease  Crohns - followed by Dr. Hilarie Fredrickson, appreciate GI consult - currently on 46m prednisone daily SBO at level of ostomy - s/p dilation of ileostomy 1/28 Dr. WDonne Hazel- CT scan showed a small bowel obstruction with transition point at the level of the ostomy -  KUB yesterday AM showed no SBO or ileus - afebrile - 480 stool output last 24h - tolerating CLD - advance to FLD - mobilize  ID - none VTE -SCDs, lovenox FEN -advance to FLD    LOS: 4 days    Brigid Re , [facility] 05/26/2017, 8:23 AM Pager: [phone] Mon-Fri 7:00 am-4:30 pm Sat-Sun 7:00 am-11:30 am

## 2017-05-27 ENCOUNTER — Inpatient Hospital Stay (HOSPITAL_COMMUNITY): Payer: Medicare HMO

## 2017-05-27 LAB — CBC
HEMATOCRIT: 44.3 % (ref 36.0–46.0)
Hemoglobin: 14.8 g/dL (ref 12.0–15.0)
MCH: 32.5 pg (ref 26.0–34.0)
MCHC: 33.4 g/dL (ref 30.0–36.0)
MCV: 97.4 fL (ref 78.0–100.0)
Platelets: 285 10*3/uL (ref 150–400)
RBC: 4.55 MIL/uL (ref 3.87–5.11)
RDW: 13.3 % (ref 11.5–15.5)
WBC: 8.5 10*3/uL (ref 4.0–10.5)

## 2017-05-27 LAB — BASIC METABOLIC PANEL
Anion gap: 7 (ref 5–15)
BUN: 11 mg/dL (ref 6–20)
CHLORIDE: 108 mmol/L (ref 101–111)
CO2: 20 mmol/L — ABNORMAL LOW (ref 22–32)
Calcium: 9.5 mg/dL (ref 8.9–10.3)
Creatinine, Ser: 0.99 mg/dL (ref 0.44–1.00)
GFR calc non Af Amer: 54 mL/min — ABNORMAL LOW (ref >60)
Glucose, Bld: 109 mg/dL — ABNORMAL HIGH (ref 65–99)
POTASSIUM: 4.2 mmol/L (ref 3.5–5.1)
SODIUM: 135 mmol/L (ref 135–145)

## 2017-05-27 MED ORDER — TRAMADOL HCL 50 MG PO TABS
50.0000 mg | ORAL_TABLET | Freq: Four times a day (QID) | ORAL | Status: DC | PRN
  Administered 2017-05-28: 100 mg via ORAL
  Filled 2017-05-27 (×3): qty 2

## 2017-05-27 MED ORDER — ONDANSETRON HCL 4 MG/2ML IJ SOLN
4.0000 mg | Freq: Three times a day (TID) | INTRAMUSCULAR | Status: DC | PRN
  Administered 2017-05-27 – 2017-06-01 (×7): 4 mg via INTRAVENOUS
  Filled 2017-05-27 (×7): qty 2

## 2017-05-27 MED ORDER — HYDROCODONE-ACETAMINOPHEN 7.5-325 MG PO TABS
1.0000 | ORAL_TABLET | Freq: Four times a day (QID) | ORAL | Status: DC | PRN
  Filled 2017-05-27: qty 1

## 2017-05-27 MED ORDER — FAMOTIDINE 20 MG PO TABS
40.0000 mg | ORAL_TABLET | Freq: Every day | ORAL | Status: DC
  Administered 2017-05-27 – 2017-06-09 (×12): 40 mg via ORAL
  Filled 2017-05-27 (×13): qty 2

## 2017-05-27 MED ORDER — ENSURE SURGERY PO LIQD
237.0000 mL | Freq: Two times a day (BID) | ORAL | Status: DC
  Administered 2017-05-27 – 2017-05-30 (×2): 237 mL via ORAL
  Filled 2017-05-27 (×16): qty 237

## 2017-05-27 NOTE — Progress Notes (Signed)
[facility] Progress Note  2 Days Post-Op  Subjective: CC: abdominal pain Patient states she is still having some abdominal pain. Starts out as an intermittent pain that progressively becomes sharper and constant. Also reports some reflux, takes famotidine at home. Denies nausea or vomiting, but does report some decrease appetite. Having gas/stool output in ostomy. Tolerated FLD.  VSS.   Objective: Vital signs in last 24 hours: Temp:  [97.8 F (36.6 C)-97.9 F (36.6 C)] 97.9 F (36.6 C) (01/30 0520) Pulse Rate:  [78-110] 78 (01/30 0520) Resp:  [16] 16 (01/30 0520) BP: (109-119)/(77-80) 110/77 (01/30 0520) SpO2:  [96 %-98 %] 96 % (01/30 0520) Last BM Date: 05/26/17  Intake/Output from previous day: 01/29 0701 - 01/30 0700 In: 2355 [P.O.:1155; I.V.:1200] Out: 580 [Stool:580] Intake/Output this shift: No intake/output data recorded.  PE: Gen:  Alert, NAD, pleasant Card:  Regular rate and rhythm, pedal pulses 2+ BL Pulm:  Normal effort, clear to auscultation bilaterally Abd: Soft, non-tender, non-distended, bowel sounds hyperactive, no HSM, midline incision well healing, stoma pink with liquid stool and gas in ostomy pouch Skin: warm and dry, no rashes  Psych: A&Ox3   Lab Results:  Recent Labs    05/27/17 0428  WBC 8.5  HGB 14.8  HCT 44.3  PLT 285   BMET Recent Labs    05/27/17 0428  NA 135  K 4.2  CL 108  CO2 20*  GLUCOSE 109*  BUN 11  CREATININE 0.99  CALCIUM 9.5   PT/INR No results for input(s): LABPROT, INR in the last 72 hours. CMP     Component Value Date/Time   NA 135 05/27/2017 0428   NA 138 12/06/2014 0804   K 4.2 05/27/2017 0428   K 4.1 12/06/2014 0804   CL 108 05/27/2017 0428   CO2 20 (L) 05/27/2017 0428   CO2 25 12/06/2014 0804   GLUCOSE 109 (H) 05/27/2017 0428   GLUCOSE 92 12/06/2014 0804   BUN 11 05/27/2017 0428   BUN 12.3 12/06/2014 0804   CREATININE 0.99 05/27/2017 0428   CREATININE 0.8 12/06/2014 0804   CALCIUM 9.5  05/27/2017 0428   CALCIUM 9.4 12/06/2014 0804   PROT 7.4 05/21/2017 0049   PROT 6.5 12/06/2014 0804   ALBUMIN 3.7 05/21/2017 0049   ALBUMIN 3.3 (L) 12/06/2014 0804   AST 21 05/21/2017 0049   AST 9 12/06/2014 0804   ALT 10 (L) 05/21/2017 0049   ALT 10 12/06/2014 0804   ALKPHOS 97 05/21/2017 0049   ALKPHOS 89 12/06/2014 0804   BILITOT 0.4 05/21/2017 0049   BILITOT 0.37 12/06/2014 0804   GFRNONAA 54 (L) 05/27/2017 0428   GFRAA >60 05/27/2017 0428   Lipase     Component Value Date/Time   LIPASE 25 05/21/2017 0049       Studies/Results: Dg Abd 1 View  Result Date: 05/25/2017 CLINICAL DATA:  Small bowel obstruction. EXAM: ABDOMEN - 1 VIEW COMPARISON:  Radiograph of May 23, 2017. FINDINGS: The bowel gas pattern is normal. No radio-opaque calculi are seen. Colostomy seen in right lower quadrant. IMPRESSION: No evidence of bowel obstruction or ileus. Electronically Signed   By: Marijo Conception, M.D.   On: 05/25/2017 08:44    Anti-infectives: Anti-infectives (From admission, onward)   None       Assessment/Plan Ex-lap with perforated viscus after colonoscopy 03/23/17, right colectomy with end ileostomy- Rosenbower History Crohns disease  Crohns - followed by Dr. Hilarie Fredrickson, appreciate GI consult - currently on 27m prednisone daily SBO at  level of ostomy - s/p dilation of ileostomy 1/28 Dr. Donne Hazel - CT scan showed a small bowel obstruction with transition point at the level of the ostomy - KUB 1/28 AM showed no SBO or ileus - afebrile -580 stool output last 24h - tolerating FLD - advance to soft diet - increased tramadol to 50-100 q6h prn; added norco 7.5-325 q6h prn; try to encourage PO pain control over IV - mobilize, PT eval   ID - none VTE -SCDs, lovenox FEN -advance to soft diet, ensure shakes; ordered home famotidine  Possibly ready for discharge in the next 24-48 h  LOS: 5 days    Brigid Re , [facility] 05/27/2017, 8:12  AM Pager: [phone] Consults: [phone] Mon-Fri 7:00 am-4:30 pm Sat-Sun 7:00 am-11:30 am

## 2017-05-27 NOTE — Progress Notes (Signed)
PT Cancellation Note  Patient Details Name: Sara Barnes MRN: [id number] DOB: January 11, 1941   Cancelled Treatment:    Reason Eval/Treat Not Completed: Medical issues which prohibited therapy, nausea, just ambulated to BR.    Claretha Cooper 05/27/2017, 4:02 PM  Tresa Endo PT [phone]

## 2017-05-28 ENCOUNTER — Inpatient Hospital Stay (HOSPITAL_COMMUNITY): Payer: Medicare HMO

## 2017-05-28 DIAGNOSIS — K56699 Other intestinal obstruction unspecified as to partial versus complete obstruction: Secondary | ICD-10-CM

## 2017-05-28 LAB — GLUCOSE, CAPILLARY: Glucose-Capillary: 126 mg/dL — ABNORMAL HIGH (ref 65–99)

## 2017-05-28 LAB — MAGNESIUM: Magnesium: 1.9 mg/dL (ref 1.7–2.4)

## 2017-05-28 LAB — BASIC METABOLIC PANEL
Anion gap: 7 (ref 5–15)
BUN: 11 mg/dL (ref 6–20)
CALCIUM: 9.4 mg/dL (ref 8.9–10.3)
CHLORIDE: 105 mmol/L (ref 101–111)
CO2: 20 mmol/L — AB (ref 22–32)
CREATININE: 1.02 mg/dL — AB (ref 0.44–1.00)
GFR calc non Af Amer: 52 mL/min — ABNORMAL LOW (ref >60)
GLUCOSE: 116 mg/dL — AB (ref 65–99)
Potassium: 4.3 mmol/L (ref 3.5–5.1)
Sodium: 132 mmol/L — ABNORMAL LOW (ref 135–145)

## 2017-05-28 LAB — PHOSPHORUS: Phosphorus: 3.9 mg/dL (ref 2.5–4.6)

## 2017-05-28 MED ORDER — PROMETHAZINE HCL 25 MG/ML IJ SOLN
12.5000 mg | Freq: Four times a day (QID) | INTRAMUSCULAR | Status: DC | PRN
  Administered 2017-05-28: 12.5 mg via INTRAVENOUS
  Filled 2017-05-28: qty 1

## 2017-05-28 MED ORDER — INSULIN ASPART 100 UNIT/ML ~~LOC~~ SOLN
0.0000 [IU] | Freq: Four times a day (QID) | SUBCUTANEOUS | Status: DC
  Administered 2017-05-28 – 2017-06-02 (×4): 1 [IU] via SUBCUTANEOUS

## 2017-05-28 MED ORDER — FAT EMULSION 20 % IV EMUL
240.0000 mL | INTRAVENOUS | Status: AC
  Administered 2017-05-28: 240 mL via INTRAVENOUS
  Filled 2017-05-28: qty 250

## 2017-05-28 MED ORDER — TRACE MINERALS CR-CU-MN-SE-ZN 10-1000-500-60 MCG/ML IV SOLN
INTRAVENOUS | Status: AC
  Administered 2017-05-28: 18:00:00 via INTRAVENOUS
  Filled 2017-05-28: qty 960

## 2017-05-28 MED ORDER — SODIUM CHLORIDE 0.9% FLUSH
10.0000 mL | INTRAVENOUS | Status: DC | PRN
  Administered 2017-05-30: 10 mL
  Filled 2017-05-28: qty 40

## 2017-05-28 MED ORDER — SODIUM CHLORIDE 0.9 % IV SOLN
INTRAVENOUS | Status: DC
  Administered 2017-05-28: 14:00:00 via INTRAVENOUS
  Administered 2017-05-29: 75 mL/h via INTRAVENOUS

## 2017-05-28 NOTE — Care Management Important Message (Signed)
Important Message  Patient Details  Name: Sara Barnes MRN: [id number] Date of Birth: 1941/03/09   Medicare Important Message Given:  Yes    Kerin Salen 05/28/2017, 11:15 AMImportant Message  Patient Details  Name: Sara Barnes MRN: [id number] Date of Birth: November 17, 1940   Medicare Important Message Given:  Yes    Kerin Salen 05/28/2017, 11:15 AM

## 2017-05-28 NOTE — Progress Notes (Addendum)
PHARMACY - ADULT TOTAL PARENTERAL NUTRITION CONSULT NOTE   Pharmacy Consult for TPN Indication: SBO  Patient Measurements: Height: 5' 6"  (167.6 cm) Weight: 147 lb (66.7 kg) IBW/kg (Calculated) : 59.3 TPN AdjBW (KG): 66.7 Body mass index is 23.73 kg/m. Usual Weight:   Insulin Requirements: unknown Chronic Prednisone, current dose 58m daily  Current Nutrition: NPO  IVF: NS at 75 ml/hr  Central access: 1/31PICC placed TPN start date: 1/31  ASSESSMENT                                                                                                          HPI:  [id number] admitted 1/24 with SBO, was resolving > re-obstructed at ileostomy. Likely need surgery. Had advanced to FRegional One Healthdiet PMH: Prev SBO resolved without surgical intervention Hx Crohns per H&P  Significant events:   Today:   Glucose - serum glucose 116 this am  Electrolytes - Na ~ low, other lytes wnl  Renal - SCr 1.02  LFTs - pending  TGs - pending  Prealbumin - pending  NUTRITIONAL GOALS                                                                                             RD recs: pending Clinimix / at a goal rate of  ml/hr + 20% fat emulsion at   ml/hr to provide:   g/day protein,   Kcal/day.  PLAN                                                                                                                         At 1800 today:  Start Clinimix E 5/15 at 40 ml/hr.  20% fat emulsion at 262mhr.  Plan to advance as tolerated to the goal rate, to be determined  TPN to contain standard multivitamins and trace elements.  Reduce IVF to 35 ml/hr.  Add Novolog sensitive SSI q6hr   TPN lab panels on Mondays & Thursdays.  F/u daily.  GrMinda DittoharmD Pager [phone]/31/2019, 2:42 PM

## 2017-05-28 NOTE — Progress Notes (Signed)
Peripherally Inserted Central Catheter/Midline Placement  The IV Nurse has discussed with the patient and/or persons authorized to consent for the patient, the purpose of this procedure and the potential benefits and risks involved with this procedure.  The benefits include less needle sticks, lab draws from the catheter, and the patient may be discharged home with the catheter. Risks include, but not limited to, infection, bleeding, blood clot (thrombus formation), and puncture of an artery; nerve damage and irregular heartbeat and possibility to perform a PICC exchange if needed/ordered by physician.  Alternatives to this procedure were also discussed.  Bard Power PICC patient education guide, fact sheet on infection prevention and patient information card has been provided to patient /or left at bedside.    PICC/Midline Placement Documentation  PICC Double Lumen 05/28/17 PICC Right Brachial 36 cm 0 cm (Active)  Indication for Insertion or Continuance of Line Prolonged intravenous therapies 05/28/2017  2:00 PM  Exposed Catheter (cm) 0 cm 05/28/2017  2:00 PM  Site Assessment Clean;Dry;Intact 05/28/2017  2:00 PM  Lumen #1 Status Flushed;Blood return noted 05/28/2017  2:00 PM  Lumen #2 Status Flushed;Blood return noted 05/28/2017  2:00 PM  Dressing Type Transparent 05/28/2017  2:00 PM  Dressing Status Clean;Dry;Intact;Antimicrobial disc in place 05/28/2017  2:00 PM  Dressing Intervention New dressing 05/28/2017  2:00 PM  Dressing Change Due 06/04/17 05/28/2017  2:00 PM       Sara Barnes 05/28/2017, 2:30 PM

## 2017-05-28 NOTE — Progress Notes (Signed)
3 Days Post-Op    CC: Abdominal pain  Subjective: Patient still complaining of abdominal pain and nausea.  She says she is unable to walk because of the increased nausea and discomfort.  She does have output from her ostomy at this point.  Patient says she is not taking much p.o. because of her ongoing nausea.  Objective: Vital signs in last 24 hours: Temp:  [98 F (36.7 C)] 98 F (36.7 C) (01/30 2159) Pulse Rate:  [87] 87 (01/30 2159) Resp:  [18] 18 (01/30 2159) BP: (132)/(74) 132/74 (01/30 2159) SpO2:  [97 %] 97 % (01/30 2159) Last BM Date: 05/28/17 240 PO listed 375 stool Afebrile, VSS Labs OK Dilaudid x 3 yesterday and x 2 this Am Intake/Output from previous day: 01/30 0701 - 01/31 0700 In: 240 [P.O.:240] Out: 375 [Stool:375] Intake/Output this shift: No intake/output data recorded.  General appearance: alert, cooperative and no distress Resp: clear to auscultation bilaterally GI: Soft, minimally distended, bowel sounds are hyperactive, very liquid ileostomy output.  Lab Results:  Recent Labs    05/27/17 0428  WBC 8.5  HGB 14.8  HCT 44.3  PLT 285    BMET Recent Labs    05/27/17 0428 05/28/17 0526  NA 135 132*  K 4.2 4.3  CL 108 105  CO2 20* 20*  GLUCOSE 109* 116*  BUN 11 11  CREATININE 0.99 1.02*  CALCIUM 9.5 9.4   PT/INR No results for input(s): LABPROT, INR in the last 72 hours.  No results for input(s): AST, ALT, ALKPHOS, BILITOT, PROT, ALBUMIN in the last 168 hours.   Lipase     Component Value Date/Time   LIPASE 25 05/21/2017 0049     Medications: . enoxaparin (LOVENOX) injection  40 mg Subcutaneous Q24H  . famotidine  40 mg Oral Daily  . feeding supplement  237 mL Oral BID BM  . FLUoxetine  20 mg Oral BID  . pantoprazole  40 mg Oral QHS  . predniSONE  5 mg Oral Q breakfast    Assessment/Plan SBO at level of ostomy- s/p dilation of ileostomy 1/28 Dr. Donne Hazel - CT scan showeda small bowel obstruction with transition point at  the level of the ostomy - KUB 1/28 AM showed no SBO or ileus - afebrile -580 stool output last 24h - tolerating FLD - advance to soft diet - increased tramadol to 50-100 q6h prn; added norco 7.5-325 q6h prn; try to encourage PO pain control over IV - mobilize, PT eval   Hx of Perforated cecum after colonoscopy/severe Crohn's disease with prior anorectal fistula Diagnostic laparoscopy, exploratory laparotomy, right colectomy 03/24/17, Dr. Zella Richer  Crohns - followed by Dr. Hilarie Fredrickson, appreciate GI consult - currently on 93m prednisone daily  ID - none VTE -SCDs, lovenox FEN -advance to soft diet, ensure shakes; ordered home   Plan: Abdominal films ordered and pending.        LOS: 6 days    Jaidin Richison 05/28/2017 [phone]

## 2017-05-28 NOTE — Progress Notes (Signed)
PT Cancellation Note  Patient Details Name: Sara Barnes MRN: [id number] DOB: 1940-07-23   Cancelled Treatment:    Reason Eval/Treat Not Completed: PT screened, no needs identified, will sign off, patient declined ambulation at this time. States she will ambu;ate with family. RN informed of this. Please reconsult if PT needs arise.   Claretha Cooper 05/28/2017, 10:05 AM  Tresa Endo PT [phone]

## 2017-05-29 ENCOUNTER — Encounter (HOSPITAL_COMMUNITY): Payer: Self-pay | Admitting: Radiology

## 2017-05-29 ENCOUNTER — Inpatient Hospital Stay (HOSPITAL_COMMUNITY): Payer: Medicare HMO

## 2017-05-29 LAB — DIFFERENTIAL
BASOS ABS: 0 10*3/uL (ref 0.0–0.1)
BASOS PCT: 0 %
EOS ABS: 0.1 10*3/uL (ref 0.0–0.7)
Eosinophils Relative: 1 %
Lymphocytes Relative: 19 %
Lymphs Abs: 1.6 10*3/uL (ref 0.7–4.0)
Monocytes Absolute: 0.9 10*3/uL (ref 0.1–1.0)
Monocytes Relative: 11 %
NEUTROS PCT: 69 %
Neutro Abs: 5.8 10*3/uL (ref 1.7–7.7)

## 2017-05-29 LAB — CBC
HCT: 40.4 % (ref 36.0–46.0)
Hemoglobin: 13.7 g/dL (ref 12.0–15.0)
MCH: 32.6 pg (ref 26.0–34.0)
MCHC: 33.9 g/dL (ref 30.0–36.0)
MCV: 96.2 fL (ref 78.0–100.0)
Platelets: 253 10*3/uL (ref 150–400)
RBC: 4.2 MIL/uL (ref 3.87–5.11)
RDW: 13 % (ref 11.5–15.5)
WBC: 8.4 10*3/uL (ref 4.0–10.5)

## 2017-05-29 LAB — COMPREHENSIVE METABOLIC PANEL
ALK PHOS: 115 U/L (ref 38–126)
ALT: 47 U/L (ref 14–54)
ANION GAP: 6 (ref 5–15)
AST: 39 U/L (ref 15–41)
Albumin: 3.1 g/dL — ABNORMAL LOW (ref 3.5–5.0)
BUN: 13 mg/dL (ref 6–20)
CALCIUM: 9.1 mg/dL (ref 8.9–10.3)
CO2: 21 mmol/L — AB (ref 22–32)
Chloride: 106 mmol/L (ref 101–111)
Creatinine, Ser: 0.87 mg/dL (ref 0.44–1.00)
GFR calc non Af Amer: >60 mL/min (ref >60)
Glucose, Bld: 120 mg/dL — ABNORMAL HIGH (ref 65–99)
Potassium: 4 mmol/L (ref 3.5–5.1)
SODIUM: 133 mmol/L — AB (ref 135–145)
TOTAL PROTEIN: 6.4 g/dL — AB (ref 6.5–8.1)
Total Bilirubin: 0.5 mg/dL (ref 0.3–1.2)

## 2017-05-29 LAB — PREALBUMIN: Prealbumin: 21.9 mg/dL (ref 18–38)

## 2017-05-29 LAB — PHOSPHORUS: PHOSPHORUS: 4.3 mg/dL (ref 2.5–4.6)

## 2017-05-29 LAB — GLUCOSE, CAPILLARY
GLUCOSE-CAPILLARY: 109 mg/dL — AB (ref 65–99)
Glucose-Capillary: 118 mg/dL — ABNORMAL HIGH (ref 65–99)
Glucose-Capillary: 124 mg/dL — ABNORMAL HIGH (ref 65–99)
Glucose-Capillary: 121 mg/dL — ABNORMAL HIGH (ref 65–99)

## 2017-05-29 LAB — TRIGLYCERIDES: TRIGLYCERIDES: 139 mg/dL (ref <150)

## 2017-05-29 LAB — MAGNESIUM: Magnesium: 1.9 mg/dL (ref 1.7–2.4)

## 2017-05-29 MED ORDER — IOPAMIDOL (ISOVUE-300) INJECTION 61%
100.0000 mL | Freq: Once | INTRAVENOUS | Status: AC | PRN
  Administered 2017-05-29: 100 mL via INTRAVENOUS

## 2017-05-29 MED ORDER — IOPAMIDOL (ISOVUE-300) INJECTION 61%
INTRAVENOUS | Status: AC
  Filled 2017-05-29: qty 100

## 2017-05-29 MED ORDER — FAT EMULSION 20 % IV EMUL
240.0000 mL | INTRAVENOUS | Status: AC
Start: 2017-05-29 — End: 2017-05-30
  Administered 2017-05-29: 240 mL via INTRAVENOUS
  Filled 2017-05-29: qty 250

## 2017-05-29 MED ORDER — BARIUM SULFATE 0.1 % PO SUSP
ORAL | Status: AC
  Filled 2017-05-29: qty 3

## 2017-05-29 MED ORDER — TRACE MINERALS CR-CU-MN-SE-ZN 10-1000-500-60 MCG/ML IV SOLN
INTRAVENOUS | Status: AC
  Administered 2017-05-29: 18:00:00 via INTRAVENOUS
  Filled 2017-05-29: qty 1440

## 2017-05-29 MED ORDER — SODIUM CHLORIDE 0.9 % IV SOLN
INTRAVENOUS | Status: AC
  Administered 2017-05-29: 20:00:00 via INTRAVENOUS

## 2017-05-29 NOTE — Progress Notes (Signed)
4 Days Post-Op    CC: Abdominal pain  Subjective: Significant improvement in drainage with Foley catheter in the ostomy site.  She is feeling better.  Her sister emptied her ostomy bag just before I came in and it was full about ready to pop again when I came in to visit her.  She is having less pain and less nausea.  Objective: Vital signs in last 24 hours: Temp:  [97.6 F (36.4 C)-98.9 F (37.2 C)] 97.6 F (36.4 C) (02/01 0557) Pulse Rate:  [72-90] 74 (02/01 0557) Resp:  [18] 18 (02/01 0557) BP: (113-139)/(65-74) 139/66 (02/01 0557) SpO2:  [96 %-97 %] 96 % (02/01 0557) Last BM Date: 05/28/17 360 p.o. Recorded 1137 IV Voided x2 no volume recorded Ileostomy: 2725 recorded Afebrile vital signs are stable Potassium is 4 creatinine 0.87 Pre-albumin 21.9 WBC 8.4   H/H: 13.7/40.4 CT enterography: There is a catheter in the right enterostomy site.  There were scattered air-fluid levels in nondilated and upper normal size loops of small bowel with obstruction identified.  Changes could represent ileus.  No obvious focal areas of transmural inflammation to suggest further active Crohn's.  Colorectal pouch extending to the mid transverse colon and rectum. Atelectasis both lower lobes. Intake/Output from previous day: 01/31 0701 - 02/01 0700 In: 1497.5 [P.O.:360; I.V.:1137.5] Out: 2726 [Urine:1; Stool:2725] Intake/Output this shift: Total I/O In: 60 [P.O.:60] Out: 400 [Stool:400]  General appearance: alert, cooperative and no distress Resp: clear to auscultation bilaterally GI: Minimal distention.  Her ostomy bag is half full of fluid and about the pop with the amount of gas coming through.  Less pain and nausea.  Lab Results:  Recent Labs    05/27/17 0428 05/29/17 0326  WBC 8.5 8.4  HGB 14.8 13.7  HCT 44.3 40.4  PLT 285 253    BMET Recent Labs    05/28/17 0526 05/29/17 0326  NA 132* 133*  K 4.3 4.0  CL 105 106  CO2 20* 21*  GLUCOSE 116* 120*  BUN 11 13   CREATININE 1.02* 0.87  CALCIUM 9.4 9.1   PT/INR No results for input(s): LABPROT, INR in the last 72 hours.  Recent Labs  Lab 05/29/17 0326  AST 39  ALT 47  ALKPHOS 115  BILITOT 0.5  PROT 6.4*  ALBUMIN 3.1*     Lipase     Component Value Date/Time   LIPASE 25 05/21/2017 0049     Medications: . barium      . enoxaparin (LOVENOX) injection  40 mg Subcutaneous Q24H  . famotidine  40 mg Oral Daily  . feeding supplement  237 mL Oral BID BM  . FLUoxetine  20 mg Oral BID  . insulin aspart  0-9 Units Subcutaneous Q6H  . iopamidol      . pantoprazole  40 mg Oral QHS  . predniSONE  5 mg Oral Q breakfast   . sodium chloride 75 mL/hr (05/29/17 0238)  . Marland KitchenTPN (CLINIMIX-E) Adult 40 mL/hr at 05/28/17 1745    Assessment/Plan SBO at level of ostomy- s/p dilation of ileostomy 1/28 Dr. Donne Hazel - CT scan showeda small bowel obstruction with transition point at the level of the ostomy - KUB1/28AM showed no SBO or ileus - afebrile -580 stool output last 24h - toleratingFLD - advance tosoft diet - increased tramadol to 50-100 q6h prn; added norco 7.5-325 q6h prn; try to encourage PO pain control over IV - mobilize, PT eval - CT enterography pending.  Hx of Perforated cecum after colonoscopy/severe Crohn's  disease with prior anorectal fistula Diagnostic laparoscopy, exploratory laparotomy, right colectomy 03/24/17, Dr. Zella Richer  Crohns - followed by Dr. Hilarie Fredrickson, appreciate GI consult - currently on 47m prednisone daily  ID - none VTE -SCDs, lovenox FEN -TNA/clear liquids Foley: None Follow-up: TBD  Plan: She seems to be markedly improved right now with Foley in place.  Dr. WDonne Hazelwill review the CT enterography, but the problem appears to be right at the site of the ileostomy.  We will need to work keep up with her fluid output now.  She is currently on IV fluids and TNA.  I will leave her on clear liquids for now         LOS: 7 days     Juliane Guest 05/29/2017 [phone]

## 2017-05-29 NOTE — Progress Notes (Signed)
Initial Nutrition Assessment  DOCUMENTATION CODES:   Not applicable  INTERVENTION:    TPN per pharmacy  Monitor for diet advancement/toleration  NUTRITION DIAGNOSIS:   Inadequate oral intake related to altered GI function as evidenced by meal completion < 50%.  GOAL:   Patient will meet greater than or equal to 90% of their needs  MONITOR:   PO intake, Supplement acceptance, Diet advancement, Weight trends, Labs, I & O's  REASON FOR ASSESSMENT:   Consult New TPN/TNA  ASSESSMENT:   Pt with PMH significant for Crohn's disease and breast cancer. Present this admission with complaints of progressive abdominal pain. Pt underwent diverting ileostomy 02/11/16 and has right colectomy 03/24/17 for perforated viscous. In the ED CT scan showed a small bowel obstruction at level of ostomy. NGT was placed.    1/25- NGT clamped, advanced to clear liquids 1/26- NGT discontinued 1/28- dilation of ileostomy 1/30- advanced to soft diet 1/31- possible repeat obstruction at ileostomy, TPN in, back to clear liquids  Spoke with pt at bedside. Reports having ongoing poor intake since colectomy in November. States she would typically eat twice per day and did not consume supplementation., Discussed possibly adding clear supplementation while on TPN in attempts to lower goal rate. Pt does not wish to do this as milk products make her nausea worse. Weight shows to trend down since November but losses are not significant at this point. Nutrition-Focused physical exam completed.   Pt currently receivingClinimix E 5/15 at 40 ml/hr with 20% fat emulsion at 19m/hr. This provides 1162 calories and 48 g protein.   RD recommends: Clinimix 5/15 @ goal rate of 70 ml/hr with 20% ILE @ 20 ml/hr x 12 hrs to provide 1673 calories and 84 g protein. This meets 100% of calorie and protein needs.   Monitor and supplement electrolytes as need per MD descretion.   Medications reviewed and include: SSI,  prednisone, NS @ 75 ml/hr Labs reviewed: Na 133 (L)   NUTRITION - FOCUSED PHYSICAL EXAM:    Most Recent Value  Orbital Region  No depletion  Upper Arm Region  Mild depletion  Thoracic and Lumbar Region  Unable to assess  Buccal Region  No depletion  Temple Region  No depletion  Clavicle Bone Region  No depletion  Clavicle and Acromion Bone Region  No depletion  Scapular Bone Region  Unable to assess  Dorsal Hand  Moderate depletion  Patellar Region  No depletion  Anterior Thigh Region  No depletion  Posterior Calf Region  No depletion  Edema (RD Assessment)  None  Hair  Reviewed  Eyes  Reviewed  Mouth  Reviewed  Skin  Reviewed  Nails  Reviewed       Diet Order:  Diet clear liquid Room service appropriate? Yes; Fluid consistency: Thin TPN (CLINIMIX-E) Adult  EDUCATION NEEDS:   Not appropriate for education at this time  Skin:  Skin Assessment: Reviewed RN Assessment  Last BM:  2/1-400 ml ostomy  Height:   Ht Readings from Last 1 Encounters:  05/21/17 5' 6"  (1.676 m)    Weight:   Wt Readings from Last 1 Encounters:  05/21/17 147 lb (66.7 kg)    Ideal Body Weight:  59.1 kg  BMI:  Body mass index is 23.73 kg/m.  Estimated Nutritional Needs:   Kcal:  1650-1850 kcal/day  Protein:  75-85 g/day  Fluid:  >1.6 L/day    CMariana SingleRD, LDN Clinical Nutrition Pager # [phone]

## 2017-05-29 NOTE — Progress Notes (Signed)
PHARMACY - ADULT TOTAL PARENTERAL NUTRITION CONSULT NOTE   Pharmacy Consult for TPN Indication: SBO  Patient Measurements: Height: 5' 6"  (167.6 cm) Weight: 147 lb (66.7 kg) IBW/kg (Calculated) : 59.3 TPN AdjBW (KG): 66.7 Body mass index is 23.73 kg/m. Usual Weight:   Insulin Requirements: 1 unit in last 24 hours  - Chronic Prednisone, current dose 38m daily  Current Nutrition: NPO  IVF: NS at 75 ml/hr  Central access: 1/31PICC placed TPN start date: 1/31  ASSESSMENT                                                                                                          HPI:  Sara Barnes admitted 1/24 with SBO, was resolving > re-obstructed at ileostomy. Likely need surgery. [facility] PMH: Prev SBO resolved without surgical intervention Hx Crohns per H&P  Significant events:   Today:   Glucose - ( goal <150),   Electrolytes - Na ~ low, Mg 1.9 other lytes wnl  Renal - SCr 0.87  LFTs - WNL (2/1)  TGs - 139 (2/1)  Prealbumin - 21.9  NUTRITIONAL GOALS                                                                                             RD recommends: Clinimix 5/15 @ goal rate of 70 ml/hr with 20% ILE @ 20 ml/hr x 12 hrs to provide 1673 calories and 84 g protein. This meets 100% of calorie and protein needs.   Estimated Nutritional Needs:   Kcal:  1650-1850 kcal/day  Protein:  75-85 g/day  Fluid:  >1.6 L/day     PLAN                                                                                                                         At 1800 today:  Increase Clinimix E 5/15 to 60 ml/hr.  20% fat emulsion at 283mhr.  Plan to advance as tolerated to the goal rate.  TPN to contain standard multivitamins and trace elements.  Reduce IVF to 20 ml/hr.  ContinueNovolog sensitive SSI q6hr   BMP, Magnesium, phosphorus with AM labs ordered  TPN  lab panels on Mondays & Thursdays.  F/u daily.   Royetta Asal, PharmD, BCPS Pager  [phone] 05/29/2017 11:23 AM

## 2017-05-30 LAB — BASIC METABOLIC PANEL
Anion gap: 7 (ref 5–15)
BUN: 17 mg/dL (ref 6–20)
CHLORIDE: 107 mmol/L (ref 101–111)
CO2: 21 mmol/L — AB (ref 22–32)
CREATININE: 0.81 mg/dL (ref 0.44–1.00)
Calcium: 9 mg/dL (ref 8.9–10.3)
GFR calc non Af Amer: >60 mL/min (ref >60)
Glucose, Bld: 110 mg/dL — ABNORMAL HIGH (ref 65–99)
POTASSIUM: 3.7 mmol/L (ref 3.5–5.1)
Sodium: 135 mmol/L (ref 135–145)

## 2017-05-30 LAB — GLUCOSE, CAPILLARY
GLUCOSE-CAPILLARY: 101 mg/dL — AB (ref 65–99)
GLUCOSE-CAPILLARY: 111 mg/dL — AB (ref 65–99)
GLUCOSE-CAPILLARY: 97 mg/dL (ref 65–99)
Glucose-Capillary: 120 mg/dL — ABNORMAL HIGH (ref 65–99)
Glucose-Capillary: 124 mg/dL — ABNORMAL HIGH (ref 65–99)

## 2017-05-30 LAB — PHOSPHORUS: Phosphorus: 4.3 mg/dL (ref 2.5–4.6)

## 2017-05-30 LAB — MAGNESIUM: MAGNESIUM: 1.9 mg/dL (ref 1.7–2.4)

## 2017-05-30 MED ORDER — FAT EMULSION 20 % IV EMUL
240.0000 mL | INTRAVENOUS | Status: AC
  Administered 2017-05-30: 240 mL via INTRAVENOUS
  Filled 2017-05-30: qty 250

## 2017-05-30 MED ORDER — SODIUM CHLORIDE 0.9 % IV SOLN
INTRAVENOUS | Status: DC
  Administered 2017-06-02 – 2017-06-07 (×3): via INTRAVENOUS

## 2017-05-30 MED ORDER — TRACE MINERALS CR-CU-MN-SE-ZN 10-1000-500-60 MCG/ML IV SOLN
INTRAVENOUS | Status: AC
  Administered 2017-05-30: 19:00:00 via INTRAVENOUS
  Filled 2017-05-30: qty 1680

## 2017-05-30 MED ORDER — SODIUM CHLORIDE 0.9% FLUSH
10.0000 mL | Freq: Two times a day (BID) | INTRAVENOUS | Status: DC
  Administered 2017-06-01 – 2017-06-08 (×3): 10 mL

## 2017-05-30 MED ORDER — SODIUM CHLORIDE 0.9% FLUSH
10.0000 mL | INTRAVENOUS | Status: DC | PRN
  Administered 2017-06-02: 20 mL
  Administered 2017-06-04 – 2017-06-06 (×4): 10 mL
  Filled 2017-05-30 (×5): qty 40

## 2017-05-30 NOTE — Progress Notes (Signed)
5 Days Post-Op    CC: Abdominal pain  Subjective: Continues to feel a little better and a little less bloated.    Objective: Vital signs in last 24 hours: Temp:  [98 F (36.7 C)-98.9 F (37.2 C)] 98 F (36.7 C) (02/02 0540) Pulse Rate:  [73-81] 73 (02/02 0540) Resp:  [16-18] 16 (02/02 0540) BP: (120-138)/(65-88) 120/65 (02/02 0540) SpO2:  [97 %-98 %] 98 % (02/02 0540) Last BM Date: 05/29/17 Intake/Output from previous day: 02/01 0701 - 02/02 0700 In: 2219.3 [P.O.:320; I.V.:1899.3] Out: 1700 [Stool:1700] Intake/Output this shift: No intake/output data recorded.  General appearance: alert, cooperative and no distress Resp: clear to auscultation bilaterally GI: Minimal distention.  Her ostomy bag has significant stool and gas.  Lab Results:  Recent Labs    05/29/17 0326  WBC 8.4  HGB 13.7  HCT 40.4  PLT 253    BMET Recent Labs    05/29/17 0326 05/30/17 0446  NA 133* 135  K 4.0 3.7  CL 106 107  CO2 21* 21*  GLUCOSE 120* 110*  BUN 13 17  CREATININE 0.87 0.81  CALCIUM 9.1 9.0   PT/INR No results for input(s): LABPROT, INR in the last 72 hours.  Recent Labs  Lab 05/29/17 0326  AST 39  ALT 47  ALKPHOS 115  BILITOT 0.5  PROT 6.4*  ALBUMIN 3.1*     Lipase     Component Value Date/Time   LIPASE 25 05/21/2017 0049     Medications: . enoxaparin (LOVENOX) injection  40 mg Subcutaneous Q24H  . famotidine  40 mg Oral Daily  . feeding supplement  237 mL Oral BID BM  . FLUoxetine  20 mg Oral BID  . insulin aspart  0-9 Units Subcutaneous Q6H  . pantoprazole  40 mg Oral QHS  . predniSONE  5 mg Oral Q breakfast   . .TPN (CLINIMIX-E) Adult 60 mL/hr at 05/29/17 1756  . sodium chloride 20 mL/hr at 05/29/17 1948    Assessment/Plan SBO at level of ostomy- s/p dilation of ileostomy 1/28 Dr. Donne Hazel - CT scan showeda small bowel obstruction with transition point at the level of the ostomy - KUB1/28AM showed no SBO or ileus - soft diet and TNA.   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 Zella Richer  Crohns - followed by Dr. Hilarie Fredrickson, appreciate GI consult - currently on 57m prednisone daily  ID - none VTE -SCDs, lovenox FEN -TNA/clear liquids Foley: None Follow-up: TBD  Improved.   Continue nutritional support and determine if stoma revision definitively required.           LOS: 8 days    FStark Klein2/05/2017

## 2017-05-30 NOTE — Progress Notes (Signed)
PHARMACY - ADULT TOTAL PARENTERAL NUTRITION CONSULT NOTE   Pharmacy Consult for TPN Indication: SBO  Patient Measurements: Height: 5' 6"  (167.6 cm) Weight: 147 lb (66.7 kg) IBW/kg (Calculated) : 59.3 TPN AdjBW (KG): 66.7 Body mass index is 23.73 kg/m. Usual Weight:   Insulin Requirements: 1 unit in last 24 hours  - Chronic Prednisone, current dose 10m daily  Current Nutrition: Advance to soft diet 2/1 (f/u amount of enteral nutrition)  IVF: NS at 20 ml/hr  Central access: 1/31 PICC placed TPN start date: 1/31  ASSESSMENT                                                                                                          HPI:  [id number] admitted 1/24 with SBO, was resolving > re-obstructed at ileostomy. Likely need surgery. [facility] PMH: Prev SBO resolved without surgical intervention Hx Crohns per H&P  Significant events:  1/31- possible repeat obstruction at ileostomy, TPN in, back to clear liquids  Today:   Glucose - at goal <150 with minimal SSI use.  Electrolytes - WNL  Renal - SCr WNL  LFTs - WNL (2/1)  TGs - 139 (2/1)  Prealbumin - 21.9  NUTRITIONAL GOALS                                                                                             RD recommends: Kcal:  1650-1850 kcal/day, Protein:  75-85 g/day  Clinimix 5/15 @ 70 mL/hr with 20% Lipids 240 mL/day to provide 84 grams protein (100% of goal) and 1672 kcal (100% of goal)   PLAN                                                                                                                         At 1800 today:  Increase to Clinimix E 5/15 at 70 ml/hr (goal rate)  20% fat emulsion at 230mhr.  Plan to advance as tolerated to the goal rate.  TPN to contain standard multivitamins and trace elements.  Reduce IVF to 10 ml/hr.  Continue Novolog sensitive SSI q6hr   BMP, Magnesium, phosphorus with AM labs ordered  TPN lab  panels on Mondays & Thursdays.  F/u  daily.   Gretta Arab PharmD, BCPS Pager [phone] 05/30/2017 11:09 AM

## 2017-05-31 LAB — GLUCOSE, CAPILLARY
GLUCOSE-CAPILLARY: 100 mg/dL — AB (ref 65–99)
GLUCOSE-CAPILLARY: 97 mg/dL (ref 65–99)
Glucose-Capillary: 106 mg/dL — ABNORMAL HIGH (ref 65–99)
Glucose-Capillary: 123 mg/dL — ABNORMAL HIGH (ref 65–99)
Glucose-Capillary: 124 mg/dL — ABNORMAL HIGH (ref 65–99)

## 2017-05-31 LAB — BASIC METABOLIC PANEL
Anion gap: 5 (ref 5–15)
BUN: 20 mg/dL (ref 6–20)
CALCIUM: 8.9 mg/dL (ref 8.9–10.3)
CHLORIDE: 109 mmol/L (ref 101–111)
CO2: 22 mmol/L (ref 22–32)
CREATININE: 0.7 mg/dL (ref 0.44–1.00)
Glucose, Bld: 102 mg/dL — ABNORMAL HIGH (ref 65–99)
Potassium: 3.6 mmol/L (ref 3.5–5.1)
SODIUM: 136 mmol/L (ref 135–145)

## 2017-05-31 LAB — MAGNESIUM: MAGNESIUM: 1.9 mg/dL (ref 1.7–2.4)

## 2017-05-31 LAB — PHOSPHORUS: PHOSPHORUS: 3.7 mg/dL (ref 2.5–4.6)

## 2017-05-31 MED ORDER — POTASSIUM CHLORIDE CRYS ER 20 MEQ PO TBCR
20.0000 meq | EXTENDED_RELEASE_TABLET | Freq: Once | ORAL | Status: AC
  Administered 2017-05-31: 20 meq via ORAL
  Filled 2017-05-31: qty 1

## 2017-05-31 MED ORDER — FAT EMULSION 20 % IV EMUL
240.0000 mL | INTRAVENOUS | Status: AC
  Administered 2017-05-31: 240 mL via INTRAVENOUS
  Filled 2017-05-31: qty 250

## 2017-05-31 MED ORDER — M.V.I. ADULT IV INJ
INJECTION | INTRAVENOUS | Status: AC
  Administered 2017-05-31: 17:00:00 via INTRAVENOUS
  Filled 2017-05-31: qty 1680

## 2017-05-31 MED ORDER — MAGNESIUM SULFATE IN D5W 1-5 GM/100ML-% IV SOLN
1.0000 g | Freq: Once | INTRAVENOUS | Status: AC
Start: 2017-05-31 — End: 2017-05-31
  Administered 2017-05-31: 1 g via INTRAVENOUS
  Filled 2017-05-31: qty 100

## 2017-05-31 NOTE — Progress Notes (Signed)
6 Days Post-Op   Subjective/Chief Complaint: Still with moderate amount of thin ileostomy output Less bloated No complaints   Objective: Vital signs in last 24 hours: Temp:  [97.9 F (36.6 C)-98.3 F (36.8 C)] 98 F (36.7 C) (02/03 0631) Pulse Rate:  [67-81] 81 (02/03 0631) Resp:  [20] 20 (02/03 0631) BP: (118-150)/(55-61) 118/55 (02/03 0631) SpO2:  [100 %] 100 % (02/03 0631) Last BM Date: 05/30/17  Intake/Output from previous day: 02/02 0701 - 02/03 0700 In: 3033.5 [P.O.:1080; I.V.:1953.5] Out: 1877 [Urine:1002; Stool:875] Intake/Output this shift: Total I/O In: 120 [P.O.:120] Out: 100 [Drains:100]  No distress Lungs - CTA B CV - Minimal distention; ostomy - held open with foley catheter; stool and gas in bag  Lab Results:  Recent Labs    05/29/17 0326  WBC 8.4  HGB 13.7  HCT 40.4  PLT 253   BMET Recent Labs    05/30/17 0446 05/31/17 0422  NA 135 136  K 3.7 3.6  CL 107 109  CO2 21* 22  GLUCOSE 110* 102*  BUN 17 20  CREATININE 0.81 0.70  CALCIUM 9.0 8.9   PT/INR No results for input(s): LABPROT, INR in the last 72 hours. ABG No results for input(s): PHART, HCO3 in the last 72 hours.  Invalid input(s): PCO2, PO2  Studies/Results: No results found.  Anti-infectives: Anti-infectives (From admission, onward)   None      Assessment/Plan: SBO at level of ostomy- s/p dilation of ileostomy 1/28 Dr. Donne Hazel - CT scan showeda small bowel obstruction with transition point at the level of the ostomy - KUB1/28AM showed no SBO or ileus - soft diet and TNA.   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 Zella Richer  Crohns - followed by Dr. Hilarie Fredrickson, appreciate GI consult - currently on  94m prednisone daily  ID - none VTE -SCDs, lovenox FEN -TNA/clear liquids Foley: None Follow-up: TBD  Improved.   Continue nutritional support  Probable ileostomy revision in next day or two.  MImogene Burn TGeorgette Dover MD, FEvansville Surgery Center Deacon[facility] CampusSurgery  General/ Trauma Surgery  05/31/2017 10:49 AM        LOS: 9 days    MMaia Petties2/06/2017

## 2017-05-31 NOTE — Progress Notes (Signed)
PHARMACY - ADULT TOTAL PARENTERAL NUTRITION CONSULT NOTE   Pharmacy Consult for TPN Indication: SBO  Patient Measurements: Height: 5' 6"  (167.6 cm) Weight: 147 lb (66.7 kg) IBW/kg (Calculated) : 59.3 TPN AdjBW (KG): 66.7 Body mass index is 23.73 kg/m. Usual Weight:   Insulin Requirements: 1 unit sensitive SSI / 24 hours  - Chronic Prednisone, continue home dose 68m daily  Current Nutrition: Advance to soft diet 2/1 - only 25% of 2/2 breakfast charted, no charting for lunch/dinner.  RN to chart today's meals.  IVF: NS at 10 ml/hr  Central access: 1/31 PICC placed TPN start date: 1/31  ASSESSMENT                                                                                                          HPI:  Sara Barnes admitted 1/24 with SBO, was resolving > re-obstructed at ileostomy. Likely need surgery. [facility]  Dilation of ileostomy on 1/28 PMH: Prev SBO resolved without surgical intervention Hx Crohns per H&P  Significant events:  1/31- possible repeat obstruction at ileostomy, TPN in, back to clear liquids 2/1 Advanced to soft diet  Today:   Glucose - at goal <150 with minimal SSI use.  Electrolytes - WNL, except K 3.6 (goal > 4) and Mag 1.9 (goal > 2)  Renal - SCr WNL  LFTs - WNL (2/1)  TGs - 139 (2/1)  Prealbumin - 21.9  NUTRITIONAL GOALS                                                                                             RD recommends: Kcal:  1650-1850 kcal/day, Protein:  75-85 g/day  Clinimix 5/15 @ 70 mL/hr with 20% Lipids 240 mL/day to provide 84 grams protein (100% of goal) and 1672 kcal (100% of goal)   PLAN                                                                                                                         Now: KCl 20 mEq PO, Mag 1g IV   At 1800 today:  Continue Clinimix E 5/15 at 70 ml/hr (goal rate)  20% fat  emulsion at 40m/hr over 12 hours.  TPN to contain standard multivitamins and trace  elements.  IVF at 10 ml/hr.  Continue Novolog sensitive SSI q6hr   TPN lab panels on Mondays & Thursdays.  F/u daily.  F/u oral diet; plans to wean TPN if tolerating enteral nutrition.   CGretta ArabPharmD, BCPS Pager [phone]/06/2017 9:34 AM

## 2017-06-01 ENCOUNTER — Telehealth: Payer: Self-pay | Admitting: Internal Medicine

## 2017-06-01 LAB — COMPREHENSIVE METABOLIC PANEL
ALBUMIN: 2.7 g/dL — AB (ref 3.5–5.0)
ALK PHOS: 80 U/L (ref 38–126)
ALT: 18 U/L (ref 14–54)
AST: 14 U/L — ABNORMAL LOW (ref 15–41)
Anion gap: 4 — ABNORMAL LOW (ref 5–15)
BUN: 17 mg/dL (ref 6–20)
CALCIUM: 8.5 mg/dL — AB (ref 8.9–10.3)
CHLORIDE: 110 mmol/L (ref 101–111)
CO2: 23 mmol/L (ref 22–32)
CREATININE: 0.66 mg/dL (ref 0.44–1.00)
GFR calc Af Amer: >60 mL/min (ref >60)
GFR calc non Af Amer: >60 mL/min (ref >60)
GLUCOSE: 104 mg/dL — AB (ref 65–99)
Potassium: 3.6 mmol/L (ref 3.5–5.1)
Sodium: 137 mmol/L (ref 135–145)
Total Bilirubin: 0.2 mg/dL — ABNORMAL LOW (ref 0.3–1.2)
Total Protein: 5.5 g/dL — ABNORMAL LOW (ref 6.5–8.1)

## 2017-06-01 LAB — DIFFERENTIAL
BASOS ABS: 0 10*3/uL (ref 0.0–0.1)
Basophils Relative: 0 %
Eosinophils Absolute: 0.2 10*3/uL (ref 0.0–0.7)
Eosinophils Relative: 2 %
LYMPHS ABS: 2 10*3/uL (ref 0.7–4.0)
Lymphocytes Relative: 22 %
MONO ABS: 1 10*3/uL (ref 0.1–1.0)
MONOS PCT: 11 %
NEUTROS ABS: 6.1 10*3/uL (ref 1.7–7.7)
Neutrophils Relative %: 65 %

## 2017-06-01 LAB — CBC
HEMATOCRIT: 37.1 % (ref 36.0–46.0)
HEMOGLOBIN: 12 g/dL (ref 12.0–15.0)
MCH: 31.4 pg (ref 26.0–34.0)
MCHC: 32.3 g/dL (ref 30.0–36.0)
MCV: 97.1 fL (ref 78.0–100.0)
Platelets: 242 10*3/uL (ref 150–400)
RBC: 3.82 MIL/uL — ABNORMAL LOW (ref 3.87–5.11)
RDW: 13.1 % (ref 11.5–15.5)
WBC: 9.3 10*3/uL (ref 4.0–10.5)

## 2017-06-01 LAB — GLUCOSE, CAPILLARY
GLUCOSE-CAPILLARY: 81 mg/dL (ref 65–99)
Glucose-Capillary: 109 mg/dL — ABNORMAL HIGH (ref 65–99)
Glucose-Capillary: 112 mg/dL — ABNORMAL HIGH (ref 65–99)

## 2017-06-01 LAB — PHOSPHORUS: PHOSPHORUS: 3.9 mg/dL (ref 2.5–4.6)

## 2017-06-01 LAB — TRIGLYCERIDES: Triglycerides: 179 mg/dL — ABNORMAL HIGH (ref <150)

## 2017-06-01 LAB — PREALBUMIN: Prealbumin: 21.6 mg/dL (ref 18–38)

## 2017-06-01 LAB — MAGNESIUM: Magnesium: 2 mg/dL (ref 1.7–2.4)

## 2017-06-01 MED ORDER — ACETAMINOPHEN 500 MG PO TABS
1000.0000 mg | ORAL_TABLET | ORAL | Status: AC
  Administered 2017-06-02: 1000 mg via ORAL
  Filled 2017-06-01: qty 2

## 2017-06-01 MED ORDER — DEXAMETHASONE SODIUM PHOSPHATE 4 MG/ML IJ SOLN
4.0000 mg | INTRAMUSCULAR | Status: AC
  Filled 2017-06-01: qty 1

## 2017-06-01 MED ORDER — POTASSIUM CHLORIDE CRYS ER 20 MEQ PO TBCR
20.0000 meq | EXTENDED_RELEASE_TABLET | Freq: Once | ORAL | Status: AC
  Administered 2017-06-01: 20 meq via ORAL
  Filled 2017-06-01: qty 1

## 2017-06-01 MED ORDER — FAT EMULSION 20 % IV EMUL
240.0000 mL | INTRAVENOUS | Status: AC
  Administered 2017-06-01: 240 mL via INTRAVENOUS
  Filled 2017-06-01: qty 250

## 2017-06-01 MED ORDER — TRACE MINERALS CR-CU-MN-SE-ZN 10-1000-500-60 MCG/ML IV SOLN
INTRAVENOUS | Status: AC
  Administered 2017-06-01: 19:00:00 via INTRAVENOUS
  Filled 2017-06-01: qty 1680

## 2017-06-01 MED ORDER — DEXTROSE 5 % IV SOLN
1.0000 g | INTRAVENOUS | Status: AC
  Filled 2017-06-01: qty 1

## 2017-06-01 MED ORDER — GABAPENTIN 300 MG PO CAPS
300.0000 mg | ORAL_CAPSULE | ORAL | Status: AC
  Administered 2017-06-02: 300 mg via ORAL
  Filled 2017-06-01: qty 1

## 2017-06-01 MED ORDER — SCOPOLAMINE 1 MG/3DAYS TD PT72
1.0000 | MEDICATED_PATCH | TRANSDERMAL | Status: AC
  Filled 2017-06-01: qty 1

## 2017-06-01 MED ORDER — TRAMADOL HCL 50 MG PO TABS: 50.0000 mg | ORAL_TABLET | Freq: Four times a day (QID) | ORAL | Status: DC | PRN

## 2017-06-01 NOTE — Progress Notes (Signed)
PHARMACY - ADULT TOTAL PARENTERAL NUTRITION CONSULT NOTE   Pharmacy Consult for TPN Indication: SBO  Patient Measurements: Height: 5' 6"  (167.6 cm) Weight: 147 lb (66.7 kg) IBW/kg (Calculated) : 59.3 TPN AdjBW (KG): 66.7 Body mass index is 23.73 kg/m. Usual Weight:   Insulin Requirements: 1 unit sensitive SSI / 24 hours  - Chronic Prednisone, continue home dose 54m daily  Current Nutrition: Advance to soft diet 2/1 -   IVF: NS at 10 ml/hr  Central access: 1/31 PICC placed TPN start date: 1/31  ASSESSMENT                                                                                                          HPI:  [id number] admitted 1/24 with SBO, was resolving > re-obstructed at ileostomy. Likely need surgery. [facility].  Dilation of ileostomy on 1/28 PMH: Prev SBO resolved without surgical intervention Hx Crohns per H&P  Significant events:  1/31- possible repeat obstruction at ileostomy, TPN in, back to clear liquids 2/4 ostomy revision this week, continue full TPN thru this procedure then will tx to enteral diet  Today:   Glucose - at goal <150 with minimal SSI use.  Electrolytes - WNL, except K 3.6 (goal > 4)   Renal - SCr WNL  LFTs - WNL (2/4)  TGs - 139 (2/1) 179 (2/4)  Prealbumin - 21.9  NUTRITIONAL GOALS                                                                                             RD recommends: Kcal:  1650-1850 kcal/day, Protein:  75-85 g/day  Clinimix 5/15 @ 70 mL/hr with 20% Lipids 240 mL/day to provide 84 grams protein (100% of goal) and 1672 kcal (100% of goal)   PLAN                                                                                                                         Now: KCl 20 mEq PO x 1  At 1800 today:  Continue Clinimix E 5/15 at 70 ml/hr (goal rate)  20% fat emulsion at 269mhr over 12 hours.  TPN  to contain standard multivitamins and trace elements.  IVF at 10 ml/hr.  Continue Novolog  sensitive SSI q6hr   BMet with Mag and Phos in am  TPN lab panels on Mondays & Thursdays.  F/u daily.   Dolly Rias RPh 06/01/2017, 11:16 AM Pager [phone]

## 2017-06-01 NOTE — Telephone Encounter (Signed)
Noted. I was trying to reach patient to possibly reschedule her office appointment but later realized she was in the hospital.

## 2017-06-01 NOTE — Care Management Important Message (Signed)
Important Message  Patient Details  Name: CATHERINE CUBERO MRN: [id number] Date of Birth: Oct 22, 1940   Medicare Important Message Given:  Yes    Kerin Salen 06/01/2017, 10:33 AMImportant Message  Patient Details  Name: LESTINE RAHE MRN: [id number] Date of Birth: 1940-10-20   Medicare Important Message Given:  Yes    Kerin Salen 06/01/2017, 10:33 AM

## 2017-06-01 NOTE — Telephone Encounter (Signed)
Pts sister states the pt is in the hospital and has been for 2 weeks. States Dottie had left a message for the pt to call.

## 2017-06-02 ENCOUNTER — Inpatient Hospital Stay (HOSPITAL_COMMUNITY): Payer: Medicare HMO | Admitting: Anesthesiology

## 2017-06-02 ENCOUNTER — Encounter (HOSPITAL_COMMUNITY): Payer: Self-pay | Admitting: Surgery

## 2017-06-02 ENCOUNTER — Encounter (HOSPITAL_COMMUNITY): Admission: EM | Disposition: A | Discharge status: Home Health | Payer: Self-pay | Source: Home / Self Care

## 2017-06-02 HISTORY — PX: OTHER SURGICAL HISTORY: SHX169

## 2017-06-02 HISTORY — PX: ILEOSTOMY CLOSURE: SHX1784

## 2017-06-02 LAB — BASIC METABOLIC PANEL
ANION GAP: 6 (ref 5–15)
BUN: 18 mg/dL (ref 6–20)
CO2: 23 mmol/L (ref 22–32)
Calcium: 8.6 mg/dL — ABNORMAL LOW (ref 8.9–10.3)
Chloride: 106 mmol/L (ref 101–111)
Creatinine, Ser: 0.7 mg/dL (ref 0.44–1.00)
GFR calc Af Amer: >60 mL/min (ref >60)
GFR calc non Af Amer: >60 mL/min (ref >60)
GLUCOSE: 113 mg/dL — AB (ref 65–99)
Potassium: 4.1 mmol/L (ref 3.5–5.1)
Sodium: 135 mmol/L (ref 135–145)

## 2017-06-02 LAB — SURGICAL PCR SCREEN
MRSA, PCR: NEGATIVE
Staphylococcus aureus: POSITIVE — AB

## 2017-06-02 LAB — PHOSPHORUS: Phosphorus: 4.3 mg/dL (ref 2.5–4.6)

## 2017-06-02 LAB — GLUCOSE, CAPILLARY
GLUCOSE-CAPILLARY: 112 mg/dL — AB (ref 65–99)
Glucose-Capillary: 128 mg/dL — ABNORMAL HIGH (ref 65–99)
Glucose-Capillary: 131 mg/dL — ABNORMAL HIGH (ref 65–99)

## 2017-06-02 LAB — MAGNESIUM: Magnesium: 1.9 mg/dL (ref 1.7–2.4)

## 2017-06-02 SURGERY — REVISION, ILEOSTOMY
Anesthesia: General | Site: Abdomen

## 2017-06-02 MED ORDER — LIDOCAINE 2% (20 MG/ML) 5 ML SYRINGE
INTRAMUSCULAR | Status: DC | PRN
Start: 2017-06-02 — End: 2017-06-02
  Administered 2017-06-02: 100 mg via INTRAVENOUS

## 2017-06-02 MED ORDER — DEXAMETHASONE SODIUM PHOSPHATE 10 MG/ML IJ SOLN
INTRAMUSCULAR | Status: AC
Start: 2017-06-02 — End: ?
  Filled 2017-06-02: qty 1

## 2017-06-02 MED ORDER — PROPOFOL 10 MG/ML IV BOLUS
INTRAVENOUS | Status: DC | PRN
  Administered 2017-06-02: 110 mg via INTRAVENOUS

## 2017-06-02 MED ORDER — 0.9 % SODIUM CHLORIDE (POUR BTL) OPTIME
TOPICAL | Status: DC | PRN
  Administered 2017-06-02: 1000 mL

## 2017-06-02 MED ORDER — TRACE MINERALS CR-CU-MN-SE-ZN 10-1000-500-60 MCG/ML IV SOLN
INTRAVENOUS | Status: DC
  Filled 2017-06-02: qty 1680

## 2017-06-02 MED ORDER — FENTANYL CITRATE (PF) 100 MCG/2ML IJ SOLN
INTRAMUSCULAR | Status: AC
  Administered 2017-06-02: 25 ug via INTRAVENOUS
  Filled 2017-06-02: qty 2

## 2017-06-02 MED ORDER — LIP MEDEX EX OINT
TOPICAL_OINTMENT | CUTANEOUS | Status: AC
  Filled 2017-06-02: qty 7

## 2017-06-02 MED ORDER — LACTATED RINGERS IV SOLN
INTRAVENOUS | Status: DC
  Administered 2017-06-02: 1000 mL via INTRAVENOUS

## 2017-06-02 MED ORDER — LIDOCAINE 2% (20 MG/ML) 5 ML SYRINGE
INTRAMUSCULAR | Status: DC | PRN
  Administered 2017-06-02: 1 mg/kg/h via INTRAVENOUS

## 2017-06-02 MED ORDER — ROCURONIUM BROMIDE 10 MG/ML (PF) SYRINGE
PREFILLED_SYRINGE | INTRAVENOUS | Status: AC
  Filled 2017-06-02: qty 5

## 2017-06-02 MED ORDER — LIDOCAINE 2% (20 MG/ML) 5 ML SYRINGE
INTRAMUSCULAR | Status: AC
  Filled 2017-06-02: qty 5

## 2017-06-02 MED ORDER — BUPIVACAINE-EPINEPHRINE 0.25% -1:200000 IJ SOLN
INTRAMUSCULAR | Status: DC | PRN
  Administered 2017-06-02: 30 mL

## 2017-06-02 MED ORDER — ROCURONIUM BROMIDE 10 MG/ML (PF) SYRINGE
PREFILLED_SYRINGE | INTRAVENOUS | Status: DC | PRN
  Administered 2017-06-02: 50 mg via INTRAVENOUS
  Administered 2017-06-02: 20 mg via INTRAVENOUS

## 2017-06-02 MED ORDER — DEXAMETHASONE SODIUM PHOSPHATE 10 MG/ML IJ SOLN
INTRAMUSCULAR | Status: DC | PRN
  Administered 2017-06-02: 10 mg via INTRAVENOUS

## 2017-06-02 MED ORDER — BUPIVACAINE LIPOSOME 1.3 % IJ SUSP
20.0000 mL | Freq: Once | INTRAMUSCULAR | Status: AC
  Administered 2017-06-02: 20 mL
  Filled 2017-06-02: qty 20

## 2017-06-02 MED ORDER — BUPIVACAINE-EPINEPHRINE 0.25% -1:200000 IJ SOLN
INTRAMUSCULAR | Status: AC
  Filled 2017-06-02: qty 1

## 2017-06-02 MED ORDER — CEFOTETAN DISODIUM-DEXTROSE 2-2.08 GM-%(50ML) IV SOLR
INTRAVENOUS | Status: AC
  Filled 2017-06-02: qty 50

## 2017-06-02 MED ORDER — FENTANYL CITRATE (PF) 100 MCG/2ML IJ SOLN
25.0000 ug | INTRAMUSCULAR | Status: DC | PRN
  Administered 2017-06-02: 25 ug via INTRAVENOUS

## 2017-06-02 MED ORDER — SODIUM CHLORIDE 0.9 % IV SOLN
250.0000 mL | INTRAVENOUS | Status: DC | PRN
Start: 2017-06-02 — End: 2017-06-09

## 2017-06-02 MED ORDER — SODIUM CHLORIDE 0.9% FLUSH: 3.0000 mL | INTRAVENOUS | Status: DC | PRN

## 2017-06-02 MED ORDER — SUGAMMADEX SODIUM 200 MG/2ML IV SOLN
INTRAVENOUS | Status: AC
  Filled 2017-06-02: qty 2

## 2017-06-02 MED ORDER — CHLORHEXIDINE GLUCONATE CLOTH 2 % EX PADS
6.0000 | MEDICATED_PAD | Freq: Every day | CUTANEOUS | Status: AC
  Administered 2017-06-04 – 2017-06-06 (×3): 6 via TOPICAL

## 2017-06-02 MED ORDER — MEPERIDINE HCL 50 MG/ML IJ SOLN: 6.2500 mg | INTRAMUSCULAR | Status: DC | PRN

## 2017-06-02 MED ORDER — MUPIROCIN 2 % EX OINT
1.0000 "application " | TOPICAL_OINTMENT | Freq: Two times a day (BID) | CUTANEOUS | Status: AC
  Administered 2017-06-02 – 2017-06-06 (×9): 1 via NASAL
  Filled 2017-06-02: qty 22

## 2017-06-02 MED ORDER — ONDANSETRON HCL 4 MG/2ML IJ SOLN
INTRAMUSCULAR | Status: AC
  Filled 2017-06-02: qty 2

## 2017-06-02 MED ORDER — PROMETHAZINE HCL 25 MG/ML IJ SOLN: 6.2500 mg | INTRAMUSCULAR | Status: DC | PRN

## 2017-06-02 MED ORDER — PROPOFOL 10 MG/ML IV BOLUS
INTRAVENOUS | Status: AC
  Filled 2017-06-02: qty 20

## 2017-06-02 MED ORDER — FENTANYL CITRATE (PF) 100 MCG/2ML IJ SOLN
INTRAMUSCULAR | Status: DC | PRN
  Administered 2017-06-02 (×5): 50 ug via INTRAVENOUS

## 2017-06-02 MED ORDER — SODIUM CHLORIDE 0.9% FLUSH
3.0000 mL | Freq: Two times a day (BID) | INTRAVENOUS | Status: DC
  Administered 2017-06-08 (×2): 3 mL via INTRAVENOUS

## 2017-06-02 MED ORDER — FAT EMULSION 20 % IV EMUL
240.0000 mL | INTRAVENOUS | Status: DC
  Filled 2017-06-02: qty 250

## 2017-06-02 MED ORDER — FENTANYL CITRATE (PF) 250 MCG/5ML IJ SOLN
INTRAMUSCULAR | Status: AC
Start: 2017-06-02 — End: ?
  Filled 2017-06-02: qty 5

## 2017-06-02 MED ORDER — CEFOTETAN DISODIUM-DEXTROSE 2-2.08 GM-%(50ML) IV SOLR
2.0000 g | INTRAVENOUS | Status: AC
  Administered 2017-06-02: 2 g via INTRAVENOUS

## 2017-06-02 MED ORDER — ONDANSETRON HCL 4 MG/2ML IJ SOLN
INTRAMUSCULAR | Status: DC | PRN
  Administered 2017-06-02: 4 mg via INTRAVENOUS

## 2017-06-02 MED ORDER — KETOROLAC TROMETHAMINE 30 MG/ML IJ SOLN: 30.0000 mg | Freq: Once | INTRAMUSCULAR | Status: DC | PRN

## 2017-06-02 SURGICAL SUPPLY — 48 items
BLADE SURG SZ10 CARB STEEL (BLADE) ×3 IMPLANT
CHLORAPREP W/TINT 26ML (MISCELLANEOUS) ×3 IMPLANT
COVER MAYO STAND STRL (DRAPES) ×3 IMPLANT
DECANTER SPIKE VIAL GLASS SM (MISCELLANEOUS) ×3 IMPLANT
DRAIN CHANNEL 19F RND (DRAIN) IMPLANT
DRAPE LAPAROSCOPIC ABDOMINAL (DRAPES) ×3 IMPLANT
DRAPE SHEET LG 3/4 BI-LAMINATE (DRAPES) IMPLANT
DRAPE UTILITY XL STRL (DRAPES) ×3 IMPLANT
DRAPE WARM FLUID 44X44 (DRAPE) ×3 IMPLANT
DRSG OPSITE POSTOP 4X10 (GAUZE/BANDAGES/DRESSINGS) IMPLANT
DRSG OPSITE POSTOP 4X6 (GAUZE/BANDAGES/DRESSINGS) IMPLANT
DRSG OPSITE POSTOP 4X8 (GAUZE/BANDAGES/DRESSINGS) IMPLANT
DRSG TEGADERM 2-3/8X2-3/4 SM (GAUZE/BANDAGES/DRESSINGS) ×6 IMPLANT
DRSG TEGADERM 4X4.75 (GAUZE/BANDAGES/DRESSINGS) ×3 IMPLANT
ELECT PENCIL ROCKER SW 15FT (MISCELLANEOUS) ×3 IMPLANT
ELECT REM PT RETURN 15FT ADLT (MISCELLANEOUS) ×3 IMPLANT
GAUZE SPONGE 4X4 12PLY STRL (GAUZE/BANDAGES/DRESSINGS) ×3 IMPLANT
GLOVE ECLIPSE 8.0 STRL XLNG CF (GLOVE) ×3 IMPLANT
GLOVE INDICATOR 8.0 STRL GRN (GLOVE) ×3 IMPLANT
GOWN STRL REUS W/TWL XL LVL3 (GOWN DISPOSABLE) ×9 IMPLANT
HANDLE SUCTION POOLE (INSTRUMENTS) ×1 IMPLANT
KIT BASIN OR (CUSTOM PROCEDURE TRAY) ×3 IMPLANT
LEGGING LITHOTOMY PAIR STRL (DRAPES) ×3 IMPLANT
PACK GENERAL/GYN (CUSTOM PROCEDURE TRAY) ×3 IMPLANT
POUCH OSTOMY 2 PC DRNBL 2.25 (WOUND CARE) ×1 IMPLANT
POUCH OSTOMY DRNBL 2 1/4 (WOUND CARE) ×2
SCISSORS METZENBAUM CVD 44CM (INSTRUMENTS) ×3 IMPLANT
SOLUTION ANTI FOG 6CC (MISCELLANEOUS) ×3 IMPLANT
SPONGE LAP 18X18 X RAY DECT (DISPOSABLE) IMPLANT
STAPLER VISISTAT 35W (STAPLE) ×3 IMPLANT
SUCTION POOLE HANDLE (INSTRUMENTS) ×3
SUT MNCRL AB 4-0 PS2 18 (SUTURE) ×3 IMPLANT
SUT PDS AB 1 CTX 36 (SUTURE) ×6 IMPLANT
SUT PDS AB 1 TP1 96 (SUTURE) IMPLANT
SUT PROLENE 2 0 BLUE (SUTURE) ×3 IMPLANT
SUT SILK 2 0 (SUTURE) ×3
SUT SILK 2 0 SH CR/8 (SUTURE) ×3 IMPLANT
SUT SILK 2-0 18XBRD TIE 12 (SUTURE) ×1 IMPLANT
SUT SILK 3 0 (SUTURE) ×3
SUT SILK 3 0 SH CR/8 (SUTURE) ×3 IMPLANT
SUT SILK 3-0 18XBRD TIE 12 (SUTURE) ×1 IMPLANT
SUT VIC AB 3-0 SH 18 (SUTURE) ×6 IMPLANT
SYR BULB IRRIGATION 50ML (SYRINGE) ×3 IMPLANT
TAPE UMBILICAL COTTON 1/8X30 (MISCELLANEOUS) ×3 IMPLANT
TOWEL OR 17X26 10 PK STRL BLUE (TOWEL DISPOSABLE) ×6 IMPLANT
TOWEL OR NON WOVEN STRL DISP B (DISPOSABLE) ×6 IMPLANT
TRAY FOLEY W/METER SILVER 16FR (SET/KITS/TRAYS/PACK) IMPLANT
YANKAUER SUCT BULB TIP 10FT TU (MISCELLANEOUS) ×3 IMPLANT

## 2017-06-02 NOTE — Transfer of Care (Signed)
Immediate Anesthesia Transfer of Care Note  Patient: Sara Barnes  Procedure(s) Performed: RESECTION OF ILEAL STRICTURE; ILEOSTOMY REVISION  (N/A Abdomen)  Patient Location: PACU  Anesthesia Type:General  Level of Consciousness: awake, alert  and oriented  Airway & Oxygen Therapy: Patient Spontanous Breathing and Patient connected to face mask oxygen  Post-op Assessment: Report given to RN and Post -op Vital signs reviewed and stable  Post vital signs: Reviewed and stable  Last Vitals:  Vitals:   06/02/17 0546 06/02/17 0712  BP: (!) 136/51 (!) 138/52  Pulse: 89 86  Resp: 18 16  Temp: 36.6 C 36.7 C  SpO2: 100% 100%    Last Pain:  Vitals:   06/02/17 1008  TempSrc:   PainSc: 3       Patients Stated Pain Goal: 4 (23/01/72 0910)  Complications: No apparent anesthesia complications

## 2017-06-02 NOTE — Anesthesia Procedure Notes (Signed)
Procedure Name: Intubation Date/Time: 06/02/2017 11:13 AM Performed by: Sharlette Dense, CRNA Patient Re-evaluated:Patient Re-evaluated prior to induction Oxygen Delivery Method: Circle system utilized Preoxygenation: Pre-oxygenation with 100% oxygen Induction Type: IV induction Laryngoscope Size: Miller and 3 Grade View: Grade II Tube type: Oral Tube size: 7.5 mm Number of attempts: 1 Airway Equipment and Method: Stylet Placement Confirmation: ETT inserted through vocal cords under direct vision,  positive ETCO2 and breath sounds checked- equal and bilateral Secured at: 21 cm Tube secured with: Tape Dental Injury: Teeth and Oropharynx as per pre-operative assessment

## 2017-06-02 NOTE — Progress Notes (Addendum)
PHARMACY - ADULT TOTAL PARENTERAL NUTRITION CONSULT NOTE   Pharmacy Consult for TPN Indication: SBO  Patient Measurements: Height: 5' 6"  (167.6 cm) Weight: 147 lb (66.7 kg) IBW/kg (Calculated) : 59.3 TPN AdjBW (KG): 66.7 Body mass index is 23.73 kg/m. Usual Weight:   Insulin Requirements: 1 unit sensitive SSI / 24 hours  - Chronic Prednisone, continue home dose 76m daily  Current Nutrition: Advance to soft diet 2/1 -   IVF: NS at 10 ml/hr  Central access: 1/31 PICC placed TPN start date: 1/31  ASSESSMENT                                                                                                          HPI:  89yoF admitted 1/24 with SBO, was resolving > re-obstructed at ileostomy. Likely need surgery. Had [facility] to [facility].  Dilation of ileostomy on 1/28 PMH: Prev SBO resolved without surgical intervention Hx Crohns per H&P  Significant events:  1/31- possible repeat obstruction at ileostomy, TPN in, back to clear liquids 2/5 ostomy revision today, continue full TPN thru this procedure then will tx to enteral diet  Today:   Glucose - at goal <150 with minimal SSI use.  Electrolytes - WNL  Renal - SCr WNL  LFTs - WNL (2/4)  TGs - 139 (2/1) 179 (2/4)  Prealbumin - 21.9  NUTRITIONAL GOALS                                                                                             RD recommends: Kcal:  1650-1850 kcal/day, Protein:  75-85 g/day  Clinimix 5/15 @ 70 mL/hr with 20% Lipids 240 mL/day to provide 84 grams protein (100% of goal) and 1672 kcal (100% of goal)   PLAN                                                                                                                          At 1800 today:  Continue Clinimix E 5/15 at 70 ml/hr (goal rate)  20% fat emulsion at 289mhr over 12 hours.  TPN to contain standard multivitamins and trace elements.  IVF at 10 ml/hr.  Continue Novolog  sensitive SSI q6hr   TPN lab panels on Mondays &  Thursdays.  F/u resuming enteral diet post op   Dolly Rias RPh 06/02/2017, 10:54 AM Pager [phone]  Pharmacy consulted to wean TPN off  Plan:  At 1600 decrease TPN to 51ms/hr x 2 hours then stop  IVF per MD  D/c TPN lab panels  D/c SSI  EDolly RiasRPh 06/02/2017, 1:15 PM Pager [phone]

## 2017-06-02 NOTE — Op Note (Signed)
06/02/2017  1:05 PM  PATIENT:  Sara Barnes  77 y.o. female  Patient Care Team: Carollee Herter, Alferd Apa, DO as PCP - General [name], MD as Consulting Physician (Breast Surgery) Truitt Merle, MD as Consulting Physician (Hematology) Sylvan Cheese, NP as Nurse Practitioner (Nurse Practitioner) Hilarie Fredrickson, Lajuan Lines, MD as Consulting Physician (Gastroenterology) Jeanne Ivan, MD as Consulting Physician (Gastroenterology) Kem Parkinson, MD as Consulting Physician (Colon and Rectal Surgery)  PRE-OPERATIVE DIAGNOSIS:  ILEOSTOMY stricture causing small bowel obstruction  POST-OPERATIVE DIAGNOSIS: ILEOSTOMY stricture causing small bowel obstruction  PROCEDURE:    RESECTION OF ILEAL STRICTURE ILEOSTOMY REVISION   SURGEON:  Adin Hector, MD  ASSISTANT: none   ANESTHESIA:   local and general  EBL:  Total I/O In: 900 [I.V.:850; IV Piggyback:50] Out: 150 [Urine:100; Blood:50]  Delay start of Pharmacological VTE agent (>24hrs) due to surgical blood loss or risk of bleeding:  no  DRAINS: NONE  SPECIMEN:  Source of Specimen:  ILEOSTOMY STRICTURE  DISPOSITION OF SPECIMEN:  PATHOLOGY  COUNTS:  YES  PLAN OF CARE: Admit to inpatient   PATIENT DISPOSITION:  PACU - hemodynamically stable.  INDICATION:    Pleasant woman with severe Crohn's disease from prior numerous surgeries.  Required ileostomy over a year ago.  Then required the proximal colon resection for cecal perforation and endoscopy.  Has had intermittent obstructive like symptoms.  Malnourished and dehydrated.  She was admitted.  Workup suspicious for transition zone at the level of the ileostomy.  CT enterography shows no definite active disease.  No worsening Crohn's on steroids only at this time.  The hope is transition to a different biologic agent since she had problems with Humira.  Regardless, she her stricture did not resolve with serial dilations.  Recommendation made for ileostomy revision.  Open versus possible  laparoscopic Truman Hayward assisted or open lysis of adhesions.  Techniques risks benefits alternatives discussed in detail.   Risks such as bleeding, infection, abscess, leak, reoperation, hernia, heart attack, death, and other risks were discussed.  I noted a good likelihood this will help address the problem.   Goals of post-operative recovery were discussed as well.  We will work to minimize complications.  Educational materials on the pathology had been given in the office.  Questions were answered.    The patient expressed understanding & wished to proceed with surgery.  OR FINDINGS:   Patient had tight stricturing at skin dermis and fascia.  Mildly dilated and noninflamed ileum and peritoneum.  No major adhesions nor mesenteric inflammation of distal ileum.  6 cm resection required.  New Brooke ileostomy created.  DESCRIPTION:   Informed consent was confirmed.  The patient underwent general anaesthesia without difficulty.  The patient was positioned appropriately.  VTE prevention in place.  The patient's abdomen was clipped, prepped, & draped in a sterile fashion.  Surgical timeout confirmed our plan.  2-0 Prolene pursestring was placed around the exposed end ileostomy.  It was obviously quite stricture down at the skin.  I began to excise around the ileostomy at the level of skin with a good 5 mm cuff around it.  I came through some dermis.  The whole ileum was quite atrophic and narrowed.  Came down to the level of fascia.  Obvious narrowing at this level as well.  Did careful focus blunt dissection as well as sharp dissection and focused cautery.  Eventually came around the fascia and rectus muscles.  I was able to enter into the peritoneal cavity.  No major adhesions felt by finger sweep.  I opened up the fascia superolaterally and this allowed me to eviscerated healthy noninflamed ileum.  Mesentery not inflamed.  I could easily eviscerate out 15 cm without difficulty.  I allowed the small bowel to  reduce down.  I made a few radial incisions at the level of the anterior fascia to help release the scar and have multiple openings to avoid re-stricturing.  This allowed 3 fingers to pass.  Excised some tight skin as well for a 35 mm diameter.  I secured the ileostomy at the level of the fascia using 3-0 Vicryl interrupted suture in 4 corners.  I then created a Brooke ileostomy with 2-0 Vicryl suture at the level of the dermis level untied x4 corners.  And then transected ileum mesentery with clamps and then came around healthy ileum.  I then tied the 4 Brooke sutures down to evert and have a nice 3 cm tall Brooke end ileostomy.  Index finger could easily intubated across the fascia.  It was not tight nor snug.  Circumferentially secured the mucosa to  dermis with interrupted 3-0 Vicryl sutures.  Ileostomy appliance placed.  Patient extubated in recovery room in stable condition.  I discussed operative findings, updated the patient's status, discussed probable steps to recovery, and gave postoperative recommendations to the patient's family.  Recommendations were made.  Questions were answered.  They expressed understanding & appreciation.   Adin Hector, M.D., F.A.C.S. Gastrointestinal and Minimally Invasive Surgery [facility] [address], [place], [place] [address] [phone] Main / Paging

## 2017-06-02 NOTE — Progress Notes (Signed)
[facility]  [address] [place], [place] [address] Phone: [phone]  FAX: [phone]      Sara Barnes [id number] August 02, 1940  CARE TEAM:  PCP: Ann Held, DO  Outpatient Care Team: Patient Care Team: Carollee Herter, Alferd Apa, DO as PCP - General [name], MD as Consulting Physician (Breast Surgery) Truitt Merle, MD as Consulting Physician (Hematology) Sylvan Cheese, NP as Nurse Practitioner (Nurse Practitioner) Hilarie Fredrickson, Lajuan Lines, MD as Consulting Physician (Gastroenterology) Jeanne Ivan, MD as Consulting Physician (Gastroenterology) Kem Parkinson, MD as Consulting Physician (Colon and Rectal Surgery)  Inpatient Treatment Team: Treatment Team: Attending Provider: Edison Pace Md, MD; Consulting Physician: Edison Pace, Md, MD; Technician: Sueanne Margarita, NT; Technician: Reuel Derby, NT; Technician: Estrellita Ludwig, NT; Technician: Maeola Harman, NT; Registered Nurse: Vicente Serene, RN   Problem List:   Principal Problem:   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 Active Problems:   Crohn disease ([place])   Ileostomy in place    Generalized anxiety disorder   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   Stricture of ileostomy   8 Days Post-Op  05/25/2017  Procedure(s): DILATION OF ILEOSTOMY   Assessment  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 She is s/p right hemicolectomy for cecal perforation in late November 2018. Not on IBD meds since, except for prednisone 5 mg daily. She developed antibodies to Humira. Plan was to try Cimzia once healed from surgery.     Plan:  Operative exploration.  Possible laparoscopic lysis adhesions.  Resection of distal ileal stricture with ileostomy  revision.  The anatomy & physiology of the digestive tract was discussed.  The pathophysiology of the colon was discussed.  Natural history risks without surgery was discussed.   I feel the risks of no intervention will lead to serious problems that outweigh the operative risks; therefore, I recommended a partial ileal resection to remove the pathology.  Minimally invasive (Robotic/Laparoscopic) & open techniques were discussed.   Risks such as bleeding, infection, abscess, leak, reoperation, injury to other organs, need for repair of tissues / organs, possible ostomy, hernia, heart attack, stroke, death, and other risks were discussed.  I noted a good likelihood this will help address the problem.   Goals of post-operative recovery were discussed as well.   Need for adequate nutrition, daily bowel regimen and healthy physical activity, to optimize recovery was noted as well. We will work to minimize complications.  Educational materials were available as well.  Questions were answered.  The patient expresses understanding & wishes to proceed with surgery.   Once postoperative ileus better resolved, advance diet and wean off parenteral nutrition.  I agree with gastroenterology that ultimately she needs to have a improved immunosuppressive regimen to minimize further issues of fistulization and strictures.  Surgery really is just palliating the consequences of uncontrolled Crohn's.  Do not want to get to the point of short gut.  Does not seem to have massive inflammation on recent CT enterography, so hopefully low-dose steroids can help control until more aggressive immunosuppression can be done  -VTE prophylaxis- SCDs, etc -mobilize as tolerated to help recovery  30 minutes spent in review, evaluation, examination, counseling, and coordination of care.  More than 50% of that time was spent in counseling.  Sara Barnes  Johney Maine, M.D., F.A.C.S. Gastrointestinal and Minimally Invasive Surgery [facility] [address] [place], [place] [address] [phone] Main / Paging   06/02/2017    Subjective: (Chief complaint)  Disappointed she cannot have surgery yesterday due to multiple urgent emergent surgeries.  Ready for surgery this morning.  Family at bedside.  Passing some flatus out the bag.  Still bloating with even liquids.  Objective:  Vital signs:  Vitals:   06/01/17 1455 06/01/17 2102 06/02/17 0546 06/02/17 0712  BP: (!) 114/43 (!) 106/45 (!) 136/51 (!) 138/52  Pulse: 76 79 89 86  Resp: 18 18 18 16   Temp: 98.2 F (36.8 C) 98.9 F (37.2 C) 97.9 F (36.6 C) 98 F (36.7 C)  TempSrc: Oral Oral Oral Oral  SpO2: 99% 98% 100% 100%  Weight:      Height:        Last BM Date: 06/01/17  Intake/Output   Yesterday:  02/04 0701 - 02/05 0700 In: 2220.2 [P.O.:240; I.V.:1980.2] Out: 1950 [Urine:1250; Stool:700] This shift:  No intake/output data recorded.  Bowel function:  Flatus: YES  BM:  No  Drain: (No drain)   Physical Exam:  General: Pt awake/alert/oriented x4 in no acute distress Eyes: PERRL, normal EOM.  Sclera clear.  No icterus Neuro: CN II-XII intact w/o focal sensory/motor deficits. Lymph: No head/neck/groin lymphadenopathy Psych:  No delerium/psychosis/paranoia HENT: Normocephalic, Mucus membranes moist.  No thrush Neck: Supple, No tracheal deviation Chest: No chest wall pain w good excursion CV:  Pulses intact.  Regular rhythm MS: Normal AROM mjr joints.  No obvious deformity  Abdomen: Soft.  Moderately distended.  Nontender.  Ileostomy pink and obviously strictured with scant flatus in bag.  No evidence of peritonitis.  No incarcerated hernias.  Ext:  No deformity.  No mjr edema.  No cyanosis Skin: No petechiae / purpura  Results:   Labs: Results for orders placed or performed during the hospital encounter of 05/21/17 (from the past 48 hour(s))  Glucose, capillary     Status: Abnormal    Collection Time: 05/31/17 12:04 PM  Result Value Ref Range   Glucose-Capillary 123 (H) 65 - 99 mg/dL   Comment 1 Notify RN    Comment 2 Document in Chart   Glucose, capillary     Status: Abnormal   Collection Time: 05/31/17  5:21 PM  Result Value Ref Range   Glucose-Capillary 100 (H) 65 - 99 mg/dL   Comment 1 Notify RN    Comment 2 Document in Chart   Glucose, capillary     Status: None   Collection Time: 06/01/17 12:03 AM  Result Value Ref Range   Glucose-Capillary 97 65 - 99 mg/dL  Comprehensive metabolic panel     Status: Abnormal   Collection Time: 06/01/17  4:19 AM  Result Value Ref Range   Sodium 137 135 - 145 mmol/L   Potassium 3.6 3.5 - 5.1 mmol/L   Chloride 110 101 - 111 mmol/L   CO2 23 22 - 32 mmol/L   Glucose, Bld 104 (H) 65 - 99 mg/dL   BUN 17 6 - 20 mg/dL   Creatinine, Ser 0.66 0.44 - 1.00 mg/dL   Calcium 8.5 (L) 8.9 - 10.3 mg/dL   Total Protein 5.5 (L) 6.5 - 8.1 g/dL   Albumin 2.7 (L) 3.5 - 5.0 g/dL   AST 14 (L) 15 - 41 U/L   ALT 18 14 - 54 U/L   Alkaline Phosphatase 80 38 - 126 U/L  Total Bilirubin 0.2 (L) 0.3 - 1.2 mg/dL   GFR calc non Af Amer >60 >60 mL/min   GFR calc Af Amer >60 >60 mL/min    Comment: (NOTE) The eGFR has been calculated using the CKD EPI equation. This calculation has not been validated in all clinical situations. eGFR's persistently <60 mL/min signify possible Chronic Kidney Disease.    Anion gap 4 (L) 5 - 15    Comment: Performed at [facility], [place] [address] [place], [place] [address]  Magnesium     Status: None   Collection Time: 06/01/17  4:19 AM  Result Value Ref Range   Magnesium 2.0 1.7 - 2.4 mg/dL    Comment: Performed at [facility], [place] [address] [place], [place] [address]  Phosphorus     Status: None   Collection Time: 06/01/17  4:19 AM  Result Value Ref Range   Phosphorus 3.9 2.5 - 4.6 mg/dL    Comment: Performed at [facility], [place] [address],  [place], [place] [address]  CBC     Status: Abnormal   Collection Time: 06/01/17  4:19 AM  Result Value Ref Range   WBC 9.3 4.0 - 10.5 K/uL   RBC 3.82 (L) 3.87 - 5.11 MIL/uL   Hemoglobin 12.0 12.0 - 15.0 g/dL   HCT 37.1 36.0 - 46.0 %   MCV 97.1 78.0 - 100.0 fL   MCH 31.4 26.0 - 34.0 pg   MCHC 32.3 30.0 - 36.0 g/dL   RDW 13.1 11.5 - 15.5 %   Platelets 242 150 - 400 K/uL    Comment: Performed at [facility], [place] [address] [place], [place] [address]  Differential     Status: None   Collection Time: 06/01/17  4:19 AM  Result Value Ref Range   Neutrophils Relative % 65 %   Neutro Abs 6.1 1.7 - 7.7 K/uL   Lymphocytes Relative 22 %   Lymphs Abs 2.0 0.7 - 4.0 K/uL   Monocytes Relative 11 %   Monocytes Absolute 1.0 0.1 - 1.0 K/uL   Eosinophils Relative 2 %   Eosinophils Absolute 0.2 0.0 - 0.7 K/uL   Basophils Relative 0 %   Basophils Absolute 0.0 0.0 - 0.1 K/uL    Comment: Performed at [facility], [place] [address] [place], [place] [address]  Triglycerides     Status: Abnormal   Collection Time: 06/01/17  4:24 AM  Result Value Ref Range   Triglycerides 179 (H) <150 mg/dL    Comment: Performed at [facility], [place] [address], [place], [place] [address]  Prealbumin     Status: None   Collection Time: 06/01/17  4:24 AM  Result Value Ref Range   Prealbumin 21.6 18 - 38 mg/dL    Comment: Performed at [place] [address], [place], [place] [address]  Glucose, capillary     Status: Abnormal   Collection Time: 06/01/17  6:45 AM  Result Value Ref Range   Glucose-Capillary 109 (H) 65 - 99 mg/dL  Glucose, capillary     Status: Abnormal   Collection Time: 06/01/17 11:30 AM  Result Value Ref Range   Glucose-Capillary 112 (H) 65 - 99 mg/dL  Glucose, capillary     Status: None   Collection Time: 06/01/17  5:53 PM  Result Value Ref Range   Glucose-Capillary 81 65 - 99 mg/dL  Surgical pcr screen     Status: Abnormal  Collection Time: 06/01/17  9:20 PM  Result Value Ref Range   MRSA, PCR NEGATIVE NEGATIVE   Staphylococcus aureus POSITIVE (A) NEGATIVE    Comment: (NOTE) The Xpert SA Assay (FDA approved for NASAL specimens in patients 70 years of age and older), is one component of a comprehensive surveillance program. It is not intended to diagnose infection nor to guide or monitor treatment. Performed at [facility], [place] [address], [place], [place] [address]   Glucose, capillary     Status: Abnormal   Collection Time: 06/02/17 12:17 AM  Result Value Ref Range   Glucose-Capillary 131 (H) 65 - 99 mg/dL  Glucose, capillary     Status: Abnormal   Collection Time: 06/02/17 12:22 AM  Result Value Ref Range   Glucose-Capillary 128 (H) 65 - 99 mg/dL  Basic metabolic panel     Status: Abnormal   Collection Time: 06/02/17  4:11 AM  Result Value Ref Range   Sodium 135 135 - 145 mmol/L   Potassium 4.1 3.5 - 5.1 mmol/L   Chloride 106 101 - 111 mmol/L   CO2 23 22 - 32 mmol/L   Glucose, Bld 113 (H) 65 - 99 mg/dL   BUN 18 6 - 20 mg/dL   Creatinine, Ser 0.70 0.44 - 1.00 mg/dL   Calcium 8.6 (L) 8.9 - 10.3 mg/dL   GFR calc non Af Amer >60 >60 mL/min   GFR calc Af Amer >60 >60 mL/min    Comment: (NOTE) The eGFR has been calculated using the CKD EPI equation. This calculation has not been validated in all clinical situations. eGFR's persistently <60 mL/min signify possible Chronic Kidney Disease.    Anion gap 6 5 - 15    Comment: Performed at [facility], [place] [address] [place], [place] [address]  Magnesium     Status: None   Collection Time: 06/02/17  4:11 AM  Result Value Ref Range   Magnesium 1.9 1.7 - 2.4 mg/dL    Comment: Performed at [facility], [place] [address], [place], [place] [address]  Phosphorus     Status: None   Collection Time: 06/02/17  4:11 AM  Result Value Ref Range   Phosphorus 4.3 2.5 - 4.6 mg/dL    Comment: Performed at  [facility], [place] [address], [place], [place] [address]  Glucose, capillary     Status: Abnormal   Collection Time: 06/02/17  6:15 AM  Result Value Ref Range   Glucose-Capillary 112 (H) 65 - 99 mg/dL    Imaging / Studies: No results found.  Medications / Allergies: per chart  Antibiotics: Anti-infectives (From admission, onward)   Start     Dose/Rate Route Frequency Ordered Stop   06/01/17 1100  cefoTEtan (CEFOTAN) 1 g in dextrose 5 % 50 mL IVPB     1 g 100 mL/hr over 30 Minutes Intravenous On call to O.R. 06/01/17 1037 06/02/17 0559        Note: Portions of this report may have been transcribed using voice recognition software. Every effort was made to ensure accuracy; however, inadvertent computerized transcription errors may be present.   Any transcriptional errors that result from this process are unintentional.     Adin Hector, M.D., F.A.C.S. Gastrointestinal and Minimally Invasive Surgery [facility] [address], [place], [place] [address] [phone] Main / Paging   06/02/2017

## 2017-06-02 NOTE — Anesthesia Preprocedure Evaluation (Signed)
Anesthesia Evaluation  Patient identified by MRN, date of birth, ID band Patient awake    Reviewed: Allergy & Precautions, H&P , NPO status , Patient's Chart, lab work & pertinent test results  History of Anesthesia Complications (+) PONV and history of anesthetic complications  Airway Mallampati: II   Neck ROM: full    Dental no notable dental hx.    Pulmonary neg pulmonary ROS,    breath sounds clear to auscultation       Cardiovascular Normal cardiovascular exam Rhythm:regular Rate:Normal     Neuro/Psych PSYCHIATRIC DISORDERS Anxiety Depression negative neurological ROS     GI/Hepatic Neg liver ROS, PUD, GERD  ,  Endo/Other    Renal/GU negative Renal ROS  negative genitourinary   Musculoskeletal negative musculoskeletal ROS (+)   Abdominal Normal abdominal exam  (+)   Peds  Hematology negative hematology ROS (+)   Anesthesia Other Findings   Reproductive/Obstetrics                             Anesthesia Physical  Anesthesia Plan  ASA: II and emergent  Anesthesia Plan: General   Post-op Pain Management:    Induction: Intravenous  PONV Risk Score and Plan: 4 or greater and Ondansetron, Dexamethasone and Treatment may vary due to age or medical condition  Airway Management Planned: Oral ETT  Additional Equipment:   Intra-op Plan:   Post-operative Plan: Extubation in OR  Informed Consent: I have reviewed the patients History and Physical, chart, labs and discussed the procedure including the risks, benefits and alternatives for the proposed anesthesia with the patient or authorized representative who has indicated his/her understanding and acceptance.   Dental advisory given  Plan Discussed with: CRNA and Surgeon  Anesthesia Plan Comments:         Anesthesia Quick Evaluation

## 2017-06-03 ENCOUNTER — Ambulatory Visit: Payer: Medicare HMO | Admitting: Internal Medicine

## 2017-06-03 MED ORDER — HYDROMORPHONE HCL 2 MG PO TABS: 2.0000 mg | ORAL_TABLET | ORAL | Status: DC | PRN

## 2017-06-03 MED ORDER — SIMVASTATIN 20 MG PO TABS
20.0000 mg | ORAL_TABLET | Freq: Every day | ORAL | Status: DC
  Administered 2017-06-04 – 2017-06-09 (×6): 20 mg via ORAL
  Filled 2017-06-03 (×6): qty 1

## 2017-06-03 MED ORDER — ACETAMINOPHEN 500 MG PO TABS
1000.0000 mg | ORAL_TABLET | Freq: Four times a day (QID) | ORAL | Status: DC
  Administered 2017-06-03 – 2017-06-08 (×18): 1000 mg via ORAL
  Administered 2017-06-09: 500 mg via ORAL
  Filled 2017-06-03 (×22): qty 2

## 2017-06-03 MED ORDER — TRAMADOL HCL 50 MG PO TABS: 100.0000 mg | ORAL_TABLET | Freq: Four times a day (QID) | ORAL | Status: DC

## 2017-06-03 MED ORDER — GABAPENTIN 300 MG PO CAPS
300.0000 mg | ORAL_CAPSULE | Freq: Two times a day (BID) | ORAL | Status: DC
  Administered 2017-06-03 – 2017-06-04 (×3): 300 mg via ORAL
  Filled 2017-06-03 (×3): qty 1

## 2017-06-03 MED ORDER — ONDANSETRON 4 MG PO TBDP: 4.0000 mg | ORAL_TABLET | Freq: Four times a day (QID) | ORAL | Status: DC | PRN

## 2017-06-03 MED ORDER — HYDROMORPHONE HCL 1 MG/ML IJ SOLN
0.5000 mg | INTRAMUSCULAR | Status: DC | PRN
  Administered 2017-06-03 (×2): 1 mg via INTRAVENOUS
  Filled 2017-06-03 (×2): qty 1

## 2017-06-03 MED ORDER — METHOCARBAMOL 500 MG PO TABS
1000.0000 mg | ORAL_TABLET | Freq: Four times a day (QID) | ORAL | Status: DC
  Administered 2017-06-04 – 2017-06-05 (×6): 1000 mg via ORAL
  Filled 2017-06-03 (×6): qty 2

## 2017-06-03 MED ORDER — ACETAMINOPHEN 500 MG PO TABS
1000.0000 mg | ORAL_TABLET | Freq: Three times a day (TID) | ORAL | Status: DC
  Administered 2017-06-03: 1000 mg via ORAL
  Filled 2017-06-03: qty 2

## 2017-06-03 MED ORDER — TRAMADOL HCL 50 MG PO TABS
50.0000 mg | ORAL_TABLET | Freq: Four times a day (QID) | ORAL | Status: DC
  Administered 2017-06-03: 50 mg via ORAL
  Filled 2017-06-03: qty 1

## 2017-06-03 MED ORDER — HYDROMORPHONE HCL 1 MG/ML IJ SOLN
0.5000 mg | INTRAMUSCULAR | Status: DC | PRN
  Filled 2017-06-03: qty 2

## 2017-06-03 MED ORDER — HYDROMORPHONE HCL 2 MG PO TABS
2.0000 mg | ORAL_TABLET | ORAL | Status: DC | PRN
  Administered 2017-06-03: 2 mg via ORAL
  Administered 2017-06-04 – 2017-06-05 (×4): 4 mg via ORAL
  Administered 2017-06-05: 2 mg via ORAL
  Administered 2017-06-05 – 2017-06-06 (×3): 4 mg via ORAL
  Administered 2017-06-06 (×2): 2 mg via ORAL
  Administered 2017-06-07 – 2017-06-08 (×3): 4 mg via ORAL
  Administered 2017-06-08: 2 mg via ORAL
  Filled 2017-06-03: qty 2
  Filled 2017-06-03: qty 1
  Filled 2017-06-03 (×4): qty 2
  Filled 2017-06-03: qty 1
  Filled 2017-06-03 (×3): qty 2
  Filled 2017-06-03: qty 1
  Filled 2017-06-03 (×2): qty 2
  Filled 2017-06-03: qty 1
  Filled 2017-06-03: qty 2
  Filled 2017-06-03: qty 1
  Filled 2017-06-03: qty 2

## 2017-06-03 MED ORDER — TRAMADOL HCL 50 MG PO TABS: 50.0000 mg | ORAL_TABLET | Freq: Four times a day (QID) | ORAL | Status: DC | PRN

## 2017-06-03 MED ORDER — BOOST / RESOURCE BREEZE PO LIQD CUSTOM: 1.0000 | Freq: Three times a day (TID) | ORAL | Status: DC

## 2017-06-03 MED ORDER — METHOCARBAMOL 500 MG PO TABS
1000.0000 mg | ORAL_TABLET | Freq: Four times a day (QID) | ORAL | Status: DC
  Administered 2017-06-03: 1000 mg via ORAL
  Filled 2017-06-03 (×2): qty 2

## 2017-06-03 MED ORDER — TRAMADOL HCL 50 MG PO TABS
50.0000 mg | ORAL_TABLET | Freq: Four times a day (QID) | ORAL | Status: DC | PRN
  Administered 2017-06-08: 50 mg via ORAL
  Filled 2017-06-03: qty 2
  Filled 2017-06-03: qty 1

## 2017-06-03 NOTE — Progress Notes (Signed)
Nutrition Follow-up  INTERVENTION:   Provide Boost Breeze po TID, each supplement provides 250 kcal and 9 grams of protein  NUTRITION DIAGNOSIS:   Inadequate oral intake related to altered GI function as evidenced by meal completion < 50%.  Ongoing.  GOAL:   Patient will meet greater than or equal to 90% of their needs  Progressing.  MONITOR:   PO intake, Supplement acceptance, Diet advancement, Weight trends, Labs, I & O's  ASSESSMENT:   Pt with PMH significant for Crohn's disease and breast cancer. Present this admission with complaints of progressive abdominal pain. Pt underwent diverting ileostomy 02/11/16 and has right colectomy 03/24/17 for perforated viscous. In the ED CT scan showed a small bowel obstruction at level of ostomy. NGT was placed.   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 2/6: Now on soft diet, tolerating  Pt tolerating soft diet. TPN was turned off 2/5. Pt has been ordered Ensure Surgery but won't drink it d/t lactose intolerance. Will trial Boost Breeze instead.  Medications reviewed. Labs reviewed: Mg/Phos/K WNL  Diet Order:  DIET SOFT Room service appropriate? Yes; Fluid consistency: Thin  EDUCATION NEEDS:   Not appropriate for education at this time  Skin:  Skin Assessment: Reviewed RN Assessment  Last BM:  2/1-400 ml ostomy  Height:   Ht Readings from Last 1 Encounters:  05/21/17 5' 6"  (1.676 m)    Weight:   Wt Readings from Last 1 Encounters:  05/21/17 147 lb (66.7 kg)    Ideal Body Weight:  59.1 kg  BMI:  Body mass index is 23.73 kg/m.  Estimated Nutritional Needs:   Kcal:  1650-1850 kcal/day  Protein:  75-85 g/day  Fluid:  >1.6 L/day  Clayton Bibles, MS, RD, LDN [place] Dietitian Pager: [phone] After Hours Pager: [phone]

## 2017-06-03 NOTE — Anesthesia Postprocedure Evaluation (Signed)
Anesthesia Post Note  Patient: Sara Barnes  Procedure(s) Performed: RESECTION OF ILEAL STRICTURE; ILEOSTOMY REVISION  (N/A Abdomen)     Patient location during evaluation: PACU Anesthesia Type: General Level of consciousness: awake and sedated Pain management: pain level controlled Vital Signs Assessment: post-procedure vital signs reviewed and stable Respiratory status: spontaneous breathing Cardiovascular status: stable Postop Assessment: no apparent nausea or vomiting Anesthetic complications: no    Last Vitals:  Vitals:   06/03/17 0145 06/03/17 0520  BP: (!) 138/56 (!) 127/49  Pulse: 72 68  Resp: 16 16  Temp: 36.5 C 36.6 C  SpO2: 98% 99%    Last Pain:  Vitals:   06/03/17 1300  TempSrc:   PainSc: 7    Pain Goal: Patients Stated Pain Goal: 2 (06/03/17 1300)               Kora Groom JR,JOHN [facility]

## 2017-06-03 NOTE — Progress Notes (Signed)
[facility] Progress Note  1 Day Post-Op  Subjective: CC: abdominal pain Patient states she was doing well post-operatively and then was woken up by abdominal pain around 0400. Currently pain is slightly improved. Patient is tolerating a soft diet. Denies nausea. Having gas/stool per stoma. Ambulating in halls. UOP good. VSS.   Objective: Vital signs in last 24 hours: Temp:  [97.2 F (36.2 C)-98.4 F (36.9 C)] 97.8 F (36.6 C) (02/06 0520) Pulse Rate:  [68-83] 68 (02/06 0520) Resp:  [11-16] 16 (02/06 0520) BP: (116-151)/(49-67) 127/49 (02/06 0520) SpO2:  [97 %-100 %] 99 % (02/06 0520) Last BM Date: 06/02/17  Intake/Output from previous day: 02/05 0701 - 02/06 0700 In: 2418.8 [P.O.:590; I.V.:1778.8; IV Piggyback:50] Out: 2175 [Urine:1675; Stool:450; Blood:50] Intake/Output this shift: Total I/O In: -  Out: 300 [Urine:300]  PE: Gen:  Alert, NAD, pleasant Card:  Regular rate and rhythm, pedal pulses 2+ BL Pulm:  Normal effort, clear to auscultation bilaterally Abd: Soft, appropriately tender, non-distended, bowel sounds present, no HSM; stoma pink with stool and gas in bag Skin: warm and dry, no rashes  Psych: A&Ox3   Lab Results:  Recent Labs    06/01/17 0419  WBC 9.3  HGB 12.0  HCT 37.1  PLT 242   BMET Recent Labs    06/01/17 0419 06/02/17 0411  NA 137 135  K 3.6 4.1  CL 110 106  CO2 23 23  GLUCOSE 104* 113*  BUN 17 18  CREATININE 0.66 0.70  CALCIUM 8.5* 8.6*   PT/INR No results for input(s): LABPROT, INR in the last 72 hours. CMP     Component Value Date/Time   NA 135 06/02/2017 0411   NA 138 12/06/2014 0804   K 4.1 06/02/2017 0411   K 4.1 12/06/2014 0804   CL 106 06/02/2017 0411   CO2 23 06/02/2017 0411   CO2 25 12/06/2014 0804   GLUCOSE 113 (H) 06/02/2017 0411   GLUCOSE 92 12/06/2014 0804   BUN 18 06/02/2017 0411   BUN 12.3 12/06/2014 0804   CREATININE 0.70 06/02/2017 0411   CREATININE 0.8 12/06/2014 0804   CALCIUM 8.6 (L)  06/02/2017 0411   CALCIUM 9.4 12/06/2014 0804   PROT 5.5 (L) 06/01/2017 0419   PROT 6.5 12/06/2014 0804   ALBUMIN 2.7 (L) 06/01/2017 0419   ALBUMIN 3.3 (L) 12/06/2014 0804   AST 14 (L) 06/01/2017 0419   AST 9 12/06/2014 0804   ALT 18 06/01/2017 0419   ALT 10 12/06/2014 0804   ALKPHOS 80 06/01/2017 0419   ALKPHOS 89 12/06/2014 0804   BILITOT 0.2 (L) 06/01/2017 0419   BILITOT 0.37 12/06/2014 0804   GFRNONAA >60 06/02/2017 0411   GFRAA >60 06/02/2017 0411   Lipase     Component Value Date/Time   LIPASE 25 05/21/2017 0049       Studies/Results: No results found.  Anti-infectives: Anti-infectives (From admission, onward)   Start     Dose/Rate Route Frequency Ordered Stop   06/02/17 1030  cefoTEtan in Dextrose 5% (CEFOTAN) IVPB 2 g     2 g Intravenous On call to O.R. 06/02/17 1021 06/02/17 1130   06/02/17 0829  cefoTEtan in Dextrose 5% (CEFOTAN) 2-2.08 GM-%(50ML) IVPB    Comments:  Waldron Session   : cabinet override      06/02/17 1287 06/02/17 1115   06/01/17 1100  cefoTEtan (CEFOTAN) 1 g in dextrose 5 % 50 mL IVPB     1 g 100 mL/hr over 30 Minutes Intravenous On  call to O.R. 06/01/17 1037 06/02/17 0559       Assessment/Plan SBO at level of ostomy- s/p dilation of ileostomy 1/28 Dr. 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 Zella Richer Crohns - followed by Dr. Hilarie Fredrickson, appreciate GI consult - currently on 79m prednisone daily  ID - cefotetan periop VTE -SCDs, lovenox FEN - soft diet Foley: None Follow-up: TBD    LOS: 12  days    KBrigid Re, PWestern [facility]opedic Surgical [facility] LLCSurgery 06/03/2017, 11:02 AM Pager: [phone] Consults: [phone]-Fri 7:00 am-4:30 pm Sat-Sun 7:00 am-11:30 am

## 2017-06-04 LAB — BASIC METABOLIC PANEL
Anion gap: 4 — ABNORMAL LOW (ref 5–15)
BUN: 11 mg/dL (ref 6–20)
CALCIUM: 8.6 mg/dL — AB (ref 8.9–10.3)
CO2: 26 mmol/L (ref 22–32)
CREATININE: 0.74 mg/dL (ref 0.44–1.00)
Chloride: 105 mmol/L (ref 101–111)
GFR calc Af Amer: >60 mL/min (ref >60)
GFR calc non Af Amer: >60 mL/min (ref >60)
GLUCOSE: 108 mg/dL — AB (ref 65–99)
Potassium: 4 mmol/L (ref 3.5–5.1)
Sodium: 135 mmol/L (ref 135–145)

## 2017-06-04 LAB — CBC
HEMATOCRIT: 37.5 % (ref 36.0–46.0)
Hemoglobin: 12.3 g/dL (ref 12.0–15.0)
MCH: 31.7 pg (ref 26.0–34.0)
MCHC: 32.8 g/dL (ref 30.0–36.0)
MCV: 96.6 fL (ref 78.0–100.0)
Platelets: 286 10*3/uL (ref 150–400)
RBC: 3.88 MIL/uL (ref 3.87–5.11)
RDW: 12.9 % (ref 11.5–15.5)
WBC: 9.7 10*3/uL (ref 4.0–10.5)

## 2017-06-04 LAB — CREATININE, SERUM
CREATININE: 0.78 mg/dL (ref 0.44–1.00)
GFR calc non Af Amer: >60 mL/min (ref >60)

## 2017-06-04 NOTE — Consult Note (Signed)
[place] Nurse ostomy consult note Stoma type/location: RLQ Ileostomy Stomal assessment/size: 1 3/4" round Peristomal assessment: red, moist, edematous Treatment options for stomal/peristomal skin: skin barrier ring Output dark brown effluent Ostomy pouching: 1pc.convex 2" pouch Education provided: Patient has had an ostomy for a long time and she and her sister care for it.  Reminded how to measure since they have not had to do that in a while.  Patient and sister complained of gas being a problem at home. Showed them a pouch with a charcoal filter, they were unaware of a product with a gas valve.  Samples given for home use but explained while here in hospital we want to see how much gas so we will continue with pouch without filter. Both very excited about this as she has to empty pouch of gas during night once or twice each night.  Enrolled patient in [place] program: No We will follow this patient and remain available to this patient, nursing, and the medical and surgical teams.  Please re-consult if we need to assist further prior to our next visit.   Fara Olden, RN-C, WTA-C, [facility] Ost[facility] Care Associate

## 2017-06-04 NOTE — Progress Notes (Signed)
[facility] Progress Note  2 Days Post-Op  Subjective: CC: abdominal pain Patient with some improvement in pain from yesterday. Tolerating a diet and no nausea. Having stool and gas output. Concerned about redness on right abdomen and flank, this is the area she is having the most pain.   Objective: Vital signs in last 24 hours: Temp:  [97.9 F (36.6 C)-98.6 F (37 C)] 97.9 F (36.6 C) (02/07 0600) Pulse Rate:  [63-71] 63 (02/06 2106) Resp:  [16-20] 16 (02/07 0600) BP: (119-130)/(47-62) 123/56 (02/07 0600) SpO2:  [98 %-100 %] 98 % (02/07 0600) Last BM Date: 06/03/17  Intake/Output from previous day: 02/06 0701 - 02/07 0700 In: 330 [P.O.:120; I.V.:210] Out: 925 [Urine:750; Stool:175] Intake/Output this shift: No intake/output data recorded.  PE: Gen:  Alert, NAD, pleasant Card:  Regular rate and rhythm, pedal pulses 2+ BL Pulm:  Normal effort, clear to auscultation bilaterally Abd: Soft, appropriately tender, non-distended, bowel sounds present, no HSM; stoma pink with stool and gas in bag, erythema and warmth right abdomen to right flank, no edema or induration Skin: warm and dry, no rashes  Psych: A&Ox3   Lab Results:  No results for input(s): WBC, HGB, HCT, PLT in the last 72 hours. BMET Recent Labs    06/02/17 0411 06/04/17 0345  NA 135  --   K 4.1  --   CL 106  --   CO2 23  --   GLUCOSE 113*  --   BUN 18  --   CREATININE 0.70 0.78  CALCIUM 8.6*  --    PT/INR No results for input(s): LABPROT, INR in the last 72 hours. CMP     Component Value Date/Time   NA 135 06/02/2017 0411   NA 138 12/06/2014 0804   K 4.1 06/02/2017 0411   K 4.1 12/06/2014 0804   CL 106 06/02/2017 0411   CO2 23 06/02/2017 0411   CO2 25 12/06/2014 0804   GLUCOSE 113 (H) 06/02/2017 0411   GLUCOSE 92 12/06/2014 0804   BUN 18 06/02/2017 0411   BUN 12.3 12/06/2014 0804   CREATININE 0.78 06/04/2017 0345   CREATININE 0.8 12/06/2014 0804   CALCIUM 8.6 (L) 06/02/2017 0411    CALCIUM 9.4 12/06/2014 0804   PROT 5.5 (L) 06/01/2017 0419   PROT 6.5 12/06/2014 0804   ALBUMIN 2.7 (L) 06/01/2017 0419   ALBUMIN 3.3 (L) 12/06/2014 0804   AST 14 (L) 06/01/2017 0419   AST 9 12/06/2014 0804   ALT 18 06/01/2017 0419   ALT 10 12/06/2014 0804   ALKPHOS 80 06/01/2017 0419   ALKPHOS 89 12/06/2014 0804   BILITOT 0.2 (L) 06/01/2017 0419   BILITOT 0.37 12/06/2014 0804   GFRNONAA >60 06/04/2017 0345   GFRAA >60 06/04/2017 0345   Lipase     Component Value Date/Time   LIPASE 25 05/21/2017 0049       Studies/Results: No results found.  Anti-infectives: Anti-infectives (From admission, onward)   Start     Dose/Rate Route Frequency Ordered Stop   06/02/17 1030  cefoTEtan in Dextrose 5% (CEFOTAN) IVPB 2 g     2 g Intravenous On call to O.R. 06/02/17 1021 06/02/17 1130   06/02/17 0829  cefoTEtan in Dextrose 5% (CEFOTAN) 2-2.08 GM-%(50ML) IVPB    Comments:  Waldron Session   : cabinet override      06/02/17 6144 06/02/17 1115   06/01/17 1100  cefoTEtan (CEFOTAN) 1 g in dextrose 5 % 50 mL IVPB     1  g 100 mL/hr over 30 Minutes Intravenous On call to O.R. 06/01/17 1037 06/02/17 0559       Assessment/Plan SBO at level of ostomy- s/p dilation of ileostomy 1/28 Dr. 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 Zella Richer Crohns - followed by Dr. Hilarie Fredrickson, appreciate GI consult - currently on 61m prednisone daily  ID - cefotetan periop, IV zosyn 2/7>> VTE -SCDs, lovenox FEN - soft  diet Foley: None Follow-up: TBD    LOS: 13 days    KBrigid Re, PEye [facility] LLCS[facility]gery 06/04/2017, 10:09 AM Pager: [phone] Consults: [phone]-Fri 7:00 am-4:30 pm Sat-Sun 7:00 am-11:30 am

## 2017-06-05 MED ORDER — FLUOXETINE HCL 20 MG PO CAPS
40.0000 mg | ORAL_CAPSULE | Freq: Every day | ORAL | Status: DC
  Administered 2017-06-05 – 2017-06-08 (×4): 40 mg via ORAL
  Filled 2017-06-05 (×5): qty 2

## 2017-06-05 MED ORDER — DEXAMETHASONE SODIUM PHOSPHATE 4 MG/ML IJ SOLN
4.0000 mg | Freq: Once | INTRAMUSCULAR | Status: AC
  Administered 2017-06-05: 4 mg via INTRAVENOUS
  Filled 2017-06-05 (×2): qty 1

## 2017-06-05 MED ORDER — METHOCARBAMOL 500 MG PO TABS
750.0000 mg | ORAL_TABLET | Freq: Four times a day (QID) | ORAL | Status: DC
  Administered 2017-06-05 – 2017-06-09 (×16): 750 mg via ORAL
  Filled 2017-06-05 (×16): qty 2

## 2017-06-05 MED ORDER — CEFAZOLIN SODIUM-DEXTROSE 2-4 GM/100ML-% IV SOLN
2.0000 g | Freq: Three times a day (TID) | INTRAVENOUS | Status: DC
  Administered 2017-06-05 – 2017-06-07 (×7): 2 g via INTRAVENOUS
  Filled 2017-06-05 (×8): qty 100

## 2017-06-05 MED ORDER — TRAMADOL HCL 50 MG PO TABS: 50.0000 mg | ORAL_TABLET | Freq: Four times a day (QID) | ORAL | 0 refills | Status: DC | PRN

## 2017-06-05 MED ORDER — GABAPENTIN 300 MG PO CAPS
300.0000 mg | ORAL_CAPSULE | Freq: Three times a day (TID) | ORAL | Status: DC
  Administered 2017-06-05 – 2017-06-09 (×13): 300 mg via ORAL
  Filled 2017-06-05 (×13): qty 1

## 2017-06-05 NOTE — Consult Note (Signed)
[place] Nurse ostomy follow up Stoma type/location: RLQ Ileostomy Pouch assessed, intact. Stoma producing brown effluent. Patient's sister at bedside.  Needed supplies present.  Sister performing ostomy care activities, no needs identified at this time.  Discussed POC with patient and bedside nurse.   Thanks Val Riles MSN, RN, CNS-BC, [employer]

## 2017-06-05 NOTE — Progress Notes (Signed)
[facility] Progress Note  3 Days Post-Op  Subjective: CC: right flank pain Patient still having pain in R flank, pain medications help but pain returns when medication wears off. Ambulated 3x yesterday. Denies nausea, tolerating diet, having good bowel function. VSS.   Objective: Vital signs in last 24 hours: Temp:  [97.5 F (36.4 C)-98 F (36.7 C)] 97.5 F (36.4 C) (02/08 0550) Pulse Rate:  [64-66] 64 (02/08 0550) Resp:  [17-18] 18 (02/08 0550) BP: (116-129)/(53-58) 129/58 (02/08 0550) SpO2:  [98 %-99 %] 98 % (02/08 0550) Weight:  [68.4 kg (150 lb 11.2 oz)] 68.4 kg (150 lb 11.2 oz) (02/07 1944) Last BM Date: 06/04/17  Intake/Output from previous day: 02/07 0701 - 02/08 0700 In: 700 [P.O.:420; I.V.:280] Out: 550 [Stool:550] Intake/Output this shift: Total I/O In: 10 [I.V.:10] Out: -   PE: Gen:  Alert, NAD, pleasant Pulm:  Normal effort Abd: Soft, tender along right flank, non-distended, incision well healed; stoma pink with stool/gas in ostomy bag Skin: warm and dry, no rashes  Psych: A&Ox3   Lab Results:  Recent Labs    06/04/17 1246  WBC 9.7  HGB 12.3  HCT 37.5  PLT 286   BMET Recent Labs    06/04/17 0345 06/04/17 1246  NA  --  135  K  --  4.0  CL  --  105  CO2  --  26  GLUCOSE  --  108*  BUN  --  11  CREATININE 0.78 0.74  CALCIUM  --  8.6*   PT/INR No results for input(s): LABPROT, INR in the last 72 hours. CMP     Component Value Date/Time   NA 135 06/04/2017 1246   NA 138 12/06/2014 0804   K 4.0 06/04/2017 1246   K 4.1 12/06/2014 0804   CL 105 06/04/2017 1246   CO2 26 06/04/2017 1246   CO2 25 12/06/2014 0804   GLUCOSE 108 (H) 06/04/2017 1246   GLUCOSE 92 12/06/2014 0804   BUN 11 06/04/2017 1246   BUN 12.3 12/06/2014 0804   CREATININE 0.74 06/04/2017 1246   CREATININE 0.8 12/06/2014 0804   CALCIUM 8.6 (L) 06/04/2017 1246   CALCIUM 9.4 12/06/2014 0804   PROT 5.5 (L) 06/01/2017 0419   PROT 6.5 12/06/2014 0804   ALBUMIN  2.7 (L) 06/01/2017 0419   ALBUMIN 3.3 (L) 12/06/2014 0804   AST 14 (L) 06/01/2017 0419   AST 9 12/06/2014 0804   ALT 18 06/01/2017 0419   ALT 10 12/06/2014 0804   ALKPHOS 80 06/01/2017 0419   ALKPHOS 89 12/06/2014 0804   BILITOT 0.2 (L) 06/01/2017 0419   BILITOT 0.37 12/06/2014 0804   GFRNONAA >60 06/04/2017 1246   GFRAA >60 06/04/2017 1246   Lipase     Component Value Date/Time   LIPASE 25 05/21/2017 0049       Studies/Results: No results found.  Anti-infectives: Anti-infectives (From admission, onward)   Start     Dose/Rate Route Frequency Ordered Stop   06/02/17 1030  cefoTEtan in Dextrose 5% (CEFOTAN) IVPB 2 g     2 g Intravenous On call to O.R. 06/02/17 1021 06/02/17 1130   06/02/17 0829  cefoTEtan in Dextrose 5% (CEFOTAN) 2-2.08 GM-%(50ML) IVPB    Comments:  Waldron Session   : cabinet override      06/02/17 9147 06/02/17 1115   06/01/17 1100  cefoTEtan (CEFOTAN) 1 g in dextrose 5 % 50 mL IVPB     1 g 100 mL/hr over 30 Minutes Intravenous  On call to O.R. 06/01/17 1037 06/02/17 0559       Assessment/Plan SBO at level of ostomy- s/p dilation of ileostomy 1/28 Dr. 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 Johney Maine -POD#3 - ileostomy having stool and gas output - tolerating soft diet  - continue current pain regimen - mobilize - possibly home in the next 24-48 h if pain controlled and tolerating diet R flank pain  - erythema improving  - more likely reaction to lidocaine possibly? 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 Zella Richer Crohns - followed by Dr. Hilarie Fredrickson, appreciate GI consult - currently on 20m prednisone daily  ID -cefotetan periop, IV zosyn 2/7>2/8 VTE -SCDs, lovenox  FEN -soft diet Foley: None Follow-up: Dr. GJohney Maine    LOS: 14 days     KBrigid Re, POtsego [facility] HospitalSurgery 06/05/2017, 8:25 AM Pager: [phone] Consults: [phone]-Fri 7:00 am-4:30 pm Sat-Sun 7:00 am-11:30 am

## 2017-06-05 NOTE — Discharge Instructions (Signed)
Ostomy Support Information ° °You’ve heard that people get along just fine with only one of their eyes, or one of their lungs, or one of their kidneys. But you also know that you have only one intestine and only one bladder, and that leaves you feeling awfully empty, both physically and emotionally: You think no other people go around without part of their intestine with the ends of their intestines sticking out through their abdominal walls.  ° °YOU ARE NOT ALONE.  There are nearly three quarters of a million people in the [place] who have an ostomy; people who have had surgery to remove all or part of their colons or bladders.  ° °There is even a national association, the [employer] with over 350 local affiliated support groups that are organized by volunteers who provide peer support and counseling. [employer] has a toll free telephone num-ber, 800-826-0826 and an educational, interactive website, www.ostomy.org  ° °An ostomy is an opening in the belly (abdominal wall) made by surgery. Ostomates are people who have had this procedure. The opening (stoma) allows the kidney or bowel to discharge waste. An external pouch covers the stoma to collect waste. Pouches are are a simple bag and are odor free. Different companies have disposable or reusable pouches to fit one's lifestyle. An ostomy can either be temporary or permanent.  ° °THERE ARE THREE MAIN TYPES OF OSTOMIES °· Colostomy. A colostomy is a surgically created opening in the large intestine (colon). °· Ileostomy. An ileostomy is a surgically created opening in the small intestine. °· Urostomy. A urostomy is a surgically created opening to divert urine away from the bladder. ° °OSTOMY Care ° °The following guidelines will make care of your colostomy easier. Keep this information close by for quick reference. ° °Helpful DIET hints °Eat a well-balanced diet including vegetables and fresh fruits. Eat on a regular schedule. Drink at least 6 to 8  glasses of fluids daily. °Eat slowly in a relaxed atmosphere. Chew your food thoroughly. Avoid chewing gum, smoking, and drinking from a straw. This will help decrease the amount of air you swallow, which may help reduce gas. °Eating yogurt or drinking buttermilk may help reduce gas. ° °To control gas at night, do not eat after 8 p.m. This will give your bowel time to quiet down before you go to bed. ° °If gas is a problem, you can purchase Beano. Sprinkle Beano on the first bite of food before eating to reduce gas. It has no flavor and should not change the taste of your food. You can buy Beano over the counter at your local drugstore. ° °Foods like fish, onions, garlic, broccoli, asparagus, and cabbage produce odor. Although your pouch is odor-proof, if you eat these foods you may notice a stronger odor when emptying your pouch. If this is a concern, you may want to limit these foods in your diet. ° °If you have an ileostomy, you will have chronic diarrhea & need to drink more liquids to avoid getting dehydrated.  Consider antidiarrheal medicine like imodium (loperamide) or Lomotil to help slow down bowel movements / diarrhea into your ileostomy bag. ° °GETTING TO GOOD BOWEL HEALTH WITH AN ILEOSTOMY °. °Irregular bowel habits such as constipation and diarrhea can lead to many problems over time.  The goal: 3-6 small BOWEL MOVEMENTS A DAY!  To have soft, regular bowel movements:  °• Drink plenty of fluids, consider 4-6 tall glasses of water a day.   ° °Controlling   diarrheA ° °o Switch to liquids and simpler foods for a few days to avoid stressing your intestines further. °o Avoid dairy products (especially milk & ice cream) for a short time.  The intestines often can lose the ability to digest lactose when stressed. °o Avoid foods that cause gassiness or bloating.  Typical foods include beans and other legumes, cabbage, broccoli, and dairy foods.  Every person has some sensitivity to other foods, so listen to our  body and avoid those foods that trigger problems for you. °o Adding fiber (Citrucel, Metamucil, psyllium, Miralax) gradually can help thicken stools by absorbing excess fluid and retrain the intestines to act more normally.  Slowly increase the dose over a few weeks.  Too much fiber too soon can backfire and cause cramping & bloating. °o Probiotics (such as active yogurt, Align, etc) may help repopulate the intestines and colon with normal bacteria and calm down a sensitive digestive tract.  Most studies show it to be of mild help, though, and such products can be costly. °o Medicines: °- Bismuth subsalicylate (ex. Kayopectate, Pepto Bismol) every 30 minutes for up to 6 doses can help control diarrhea.  Avoid if pregnant. °- Loperamide (Immodium) can slow down diarrhea.  Start with two tablets (4mg total) first and then try one tablet every 6 hours.  Avoid if you are having fevers or severe pain.  If you are not better or start feeling worse, stop all medicines and call your doctor for advice °o Call your doctor if you are getting worse or not better.  Sometimes further testing (cultures, endoscopy, X-ray studies, bloodwork, etc) may be needed to help diagnose and treat the cause of the diarrhea. ° °TROUBLESHOOTING IRREGULAR BOWELS °1) Avoid extremes of bowel movements (no bad constipation/diarrhea) °2) Miralax 17gm mixed in 8oz. water or juice-daily. May use twice a day as needed  °3) Gas-x,Phazyme, etc. as needed for gas & bloating.  °4) Soft,bland diet. No spicy,greasy,fried foods.  °5) Prilosec (omeprazole) over-the-counter as needed  °6) May hold gluten/wheat products from diet to see if symptoms improve.  °7)  May try probiotics (Align, Activa, etc) to help calm the bowels down °7) If symptoms become worse call back immediately. ° ° °Applying the pouching system °To apply your pouch, follow these steps: ° °Place all your equipment close at hand before removing your pouch. ° °Wash your hands. ° °Stand or sit in  front of a mirror. Use the position that works best for you. Remember that you must keep the skin around the stoma wrinkle-free for a good seal. ° °Gently remove the used pouch (1-piece system) or the pouch and old wafer (2-piece system). Empty the pouch into the toilet. Save the closure clip to use again. ° °Wash the stoma itself and the skin around the stoma. Your stoma may bleed a little when being washed. This is normal. Rinse and pat dry. You may use a wash cloth or soft paper towels (like Bounty), mild soap (like Dial, Safeguard, or Ivory), and water. Avoid soaps that contain perfumes or lotions. ° °For a new pouch (1-piece system) or a new wafer (2-piece system), measure your stoma using the stoma guide in each box of supplies. ° °Trace the shape of your stoma onto the back of the new pouch or the back of the new wafer. Cut out the opening. Remove the paper backing and set it aside. ° °Optional: Apply a skin barrier powder to surrounding skin if it is irritated (bare or weeping),   and dust off the excess. °Optional: Apply a skin-prep wipe (such as Skin Prep or All-Kare) to the skin around the stoma, and let it dry. Do not apply this solution if the skin is irritated (red, tender, or broken) or if you have shaved around the stoma. °Optional: Apply a skin barrier paste (such as Stomahesive, Coloplast, or Premium) around the opening cut in the back of the pouch or wafer. Allow it to dry for 30 to 60 seconds. ° °Hold the pouch (1-piece system) or wafer (2-piece system) with the sticky side toward your body. Make sure the skin around the stoma is wrinkle-free. Center the opening on the stoma, then press firmly to your abdomen (Fig. 4). Look in the mirror to check if you are placing the pouch, or wafer, in the right position. For a 2-piece system, snap the pouch onto the wafer. Make sure it snaps into place securely. ° °Place your hand over the stoma and the pouch or wafer for about 30 seconds. The heat from your  hand can help the pouch or wafer stick to your skin. ° °Add deodorant (such as Super Banish or Nullo) to your pouch. Other options include food extracts such as vanilla oil and peppermint extract. Add about 10 drops of the deodorant to the pouch. Then apply the closure clamp. Note: Do not use toxic ° chemicals or commercial cleaning agents in your pouch. These substances may harm the stoma. ° °Optional: For extra seal, apply tape to all 4 sides around the pouch or wafer, as if you were framing a picture. You may use any brand of medical adhesive tape. °Change your pouch every 5 to 7 days. Change it immediately if a leak occurs.  Wash your hands afterwards. ° °If you are wearing a 2-piece system, you may use 2 new pouches per week and alternate them. Rinse the pouch with mild soap and warm water and hang it to dry for the next day. Apply the fresh pouch. Alternate the 2 pouches like this for a week. After a week, change the wafer and begin with 2 new pouches. Place the old pouches in a plastic bag, and put them in the trash. ° ° ° °Tips for colostomy care ° °Applying Your Pouch °You may stand or sit to apply your pouch. ° °Keep the skin where you apply the pouch wrinkle-free. If the skin around the pouch is wrinkled, the seal may break when your skin stretches. ° °If hair grows close to your stoma, you may trim off the hair with scissors, an electric razor, or a safety razor. ° °Always have a mirror nearby so you can get a better view of your stoma. ° °When you apply a new pouch, write the date on the adhesive tape. This will remind you of when you last changed your pouch. ° °Changing Your Pouch °The best time to change your pouch is in the morning, before eating or drinking anything. Your stoma can function at any time, but it will function more after eating or drinking. ° °Emptying Your Pouch °Empty your pouch when it is one-third full (of urine, stool, and/or gas). If you wait until your pouch is fuller than this,  it will be more difficult to empty and more noticeable. °When you empty your pouch, either put toilet paper in the toilet bowl first, or flush the toilet while you empty the pouch. This will reduce splashing. You can empty the pouch between your legs or to one side while sitting,   or while standing or stooping. If you have a 2-piece system, you can snap off the pouch to empty it. Remember that your stoma may function during this time. °If you wish to rinse your pouch after you empty it, a turkey baster can be helpful. When using a baster, squirt water up into the pouch through the opening at the °bottom. With a 2-piece system, you can snap off the pouch to rinse it. After rinsing  your pouch, empty it into the toilet. °When rinsing your pouch at home, put a few granules of Dreft soap in the rinse water. This helps lubricate and freshen your pouch. °The inside of your pouch can be sprayed with non-stick cooking oil (Pam spray). This may help reduce stool sticking to the inside of the pouch. ° °Bathing °You may shower or bathe with your pouch on or off. Remember that your stoma may function during this time. ° °The materials you use to wash your stoma and the skin around it should be clean, but they do not need to be sterile. ° °Wearing Your Pouch °During hot weather, or if you perspire a lot in general, wear a cover over your pouch. This may prevent a rash on your skin under the pouch. Pouch covers are sold at ostomy supply stores. °Wear the pouch inside your underwear for better support. °Watch your weight. Any gain or loss of 10 to 15 pounds or more can change the way your pouch fits. ° °Going Away From Home °A collapsible cup (like those that come in travel kits) or a soft plastic squirt bottle with a pull-up top (like a travel bottle for shampoo) can be used for rinsing your pouch when you are away from home. Tilt the opening of the pouch at an upward angle when using a cup to rinse. ° °Carry wet wipes or extra  tissues to use in public bathrooms. ° °Carry an extra pouching system with you at all times. ° °Never keep ostomy supplies in the glove compartment of your car. Extreme heat or cold can damage the skin barriers and adhesive wafers on the pouch. ° °When you travel, carry your ostomy supplies with you at all times. Keep them within easy reach. Do not pack ostomy supplies in baggage that will be checked or otherwise separated from you, because your baggage might be lost. If you’re traveling out of the country, it is helpful to have a letter stating that you are carrying ostomy supplies as a medical necessity. ° °If you need ostomy supplies while traveling, look in the yellow pages of the telephone book under “Surgical Supplies.” Or call the local ostomy organization to find out where supplies are available. ° °Do not let your ostomy supplies get low. Always order new pouches before you use the last one. ° °Reducing Odor °Limit foods such as broccoli, cabbage, onions, fish, and garlic in your diet to help reduce odor. °Each time you empty your pouch, carefully clean the opening of the pouch, both inside and outside, with toilet paper. °Rinse your pouch 1 or 2 times daily after you empty it (see directions for emptying your pouch and going away from home). °Add deodorant (such as Super Banish or Nullo) to your pouch. °Use air deodorizers in your bathroom. °Do not add aspirin to your pouch. Even though aspirin can help prevent odor, it could cause ulcers on your stoma. ° °When to call the doctor °Call the doctor if you have any of the following symptoms: °Purple, black,   or white stoma Severe cramps lasting more than 6 hours Severe watery discharge from the stoma lasting more than 6 hours No output from the colostomy for 3 days Excessive bleeding from your stoma Swelling of your stoma to more than 1/2-inch larger than usual Pulling inward of your stoma below skin level Severe skin irritation or deep ulcers Bulging  or other changes in your abdomen  When to call your ostomy nurse Call your ostomy/enterostomal therapy (ET) nurse if any of the following occurs: Frequent leaking of your pouching system Change in size or appearance of your stoma, causing discomfort or problems with your pouch Skin rash or rawness Weight gain or loss that causes problems with your pouch      FREQUENTLY ASKED QUESTIONS   Why havent you met any of these folks who have an ostomy?  Well, maybe you have! You just did not recognize them because an ostomy doesn't show. It can be kept secret if you wish. Why, maybe some of your best friends, office associates or neighbors have an ostomy ... you never can tell.   People facing ostomy surgery have many quality-of-life questions like:  Will you bulge? Smell? Make noises? Will you feel waste leaving your body? Will you be a captive of the toilet? Will you starve? Be a social outcast? Get/stay married? Have babies? Easily bathe, go swimming, bend over?  OK, lets look at what you can expect:   Will you bulge?  Remember, without part of the intestine or bladder, and its contents, you should have a flatter tummy than before. You can expect to wear, with little exception, what you wore before surgery ... and this in-cludes tight clothing and bathing suits.   Will you smell?  Today, thanks to modern odor proof pouching systems, you can walk into an ostomy support group meeting and not smell anything that is foul or offensive. And, for those with an ileostomy or colostomy who are concerned about odor when emptying their pouch, there are in-pouch deodorants that can be used to eliminate any waste odors that may exist.   Will you make noises?  Everyone produces gas, especially if they are an air-swallower. But intestinal sounds that occur from time to time are no differ-ent than a gurgling tummy, and quite often your clothing will muffle any sounds.   Will you feel the waste discharges?   For those with a colostomy or ileostomy there might be a slight pressure when waste leaves your body, but understand that the intestines have no nerve endings, so there will be no unpleasant sensations. Those with a urostomy will probably be unaware of any kidney drainage.   Will you be a captive of the toilet?  Immediately post-op you will spend more time in the bathroom than you will after your body recovers from surgery. Every person is different, but on average those with an ileostomy or urostomy may empty their pouches 4 to 6 times a day; a little  less if you have a colostomy. The average wear time between pouch system changes is 3 to 5 days and the changing process should take less than 30 minutes.   Will I need to be on a special diet? Most people return to their normal diet when they have recovered from surgery. Be sure to chew your food well, eat a well-balanced diet and drink plenty of fluids. If you experience problems with a certain food, wait a couple of weeks and try it again.  Will there be  odor and noises? Pouching systems are designed to be odor-proof or odor-resistant. There are deodorants that can be used in the pouch. Medications are also available to help reduce odor. Limit gas-producing foods and carbonated beverages. You will experience less gas and fewer noises as you heal from surgery.  How much time will it take to care for my ostomy? At first, you may spend a lot of time learning about your ostomy and how to take care of it. As you become more comfortable and skilled at changing the pouching system, it will take very little time to care for it.   Will I be able to return to work? People with ostomies can perform most jobs. As soon as you have healed from surgery, you should be able to return to work. Heavy lifting (more than 10 pounds) may be discouraged.   What about intimacy? Sexual relationships and intimacy are important and fulfilling aspects of your life. They  should continue after ostomy surgery. Intimacy-related concerns should be discussed openly between you and your partner.   Can I wear regular clothing? You do not need to wear special clothing. Ostomy pouches are fairly flat and barely noticeable. Elastic undergarments will not hurt the stoma or prevent the ostomy from functioning.   Can I participate in sports? An ostomy should not limit your involvement in sports. Many people with ostomies are runners, skiers, swimmers or participate in other active lifestyles. Talk with your caregiver first before doing heavy physical activity.  Will you starve?  Not if you follow doctors orders at each stage of your post-op adjustment. There is no such thing as an ostomy diet. Some people with an ostomy will be able to eat and tolerate anything; others may find diffi-culty with some foods. Each person is an individual and must determine, by trial, what is best for them. A good practice for all is to drink plenty of water.   Will you be a social outcast?  Have you met anyone who has an ostomy and is a social outcast? Why should you be the first? Only your attitude and self image will effect how you are treated. No confi-dent person is an Occupational psychologist.     PROFESSIONAL HELP  Resources are available if you need help or have questions about your ostomy.    Specially trained nurses called Wound, Ostomy Continence Nurses (WOCN) are available for consultation in most major medical centers.   Consider getting an ostomy consult at one of the outpatient ostomy clinics.  [facility] one in [place] at this time   The [employer] ([employer]) is a group made up of many local chapters throughout the [place]. These local groups hold meetings and provide support to prospective and existing ostomates. They sponsor educational events and have qualified visitors to make personal or telephone visits. Contact the [employer] for the chapter nearest you and for other  educational publications.   More detailed information can be found in Colostomy Guide, a publication of the [employer] ([employer]OA). Contact [employer] at [phone] or visit their web site at [email or web address] The website contains links to other sites, suppliers and resources.   Tree surgeon Start Services:  Start at the website to enlist for support.  Your Wound Ostomy (WOCN) nurse may have started this process. [email or web address]  Secure Start services are designed to support people as they live their lives with an ostomy or neurogenic bladder. Enrolling is easy and at no cost to the patient. We realize that  each person's needs and life journey are different. Through Secure Start services, we want to help people live their life, their way.  GETTING TO GOOD BOWEL HEALTH.  ######################################################################  EAT Gradually transition to a high fiber diet with a fiber supplement over the next few weeks after discharge.  Start with a pureed / full liquid diet (see below)  WALK Walk an hour a day.  Control your pain to do that.    HAVE A BOWEL MOVEMENT DAILY Keep your bowels regular to avoid problems.  OK to try a laxative to override constipation.  OK to use an antidairrheal to slow down diarrhea.  Call if not better after 2 tries  CALL IF YOU HAVE PROBLEMS/CONCERNS Call if you are still struggling despite following these instructions. Call if you have concerns not answered by these instructions  ######################################################################   Irregular bowel habits such as constipation and diarrhea can lead to many problems over time.  Having one soft bowel movement a day is the most important way to prevent further problems.  The anorectal canal is designed to handle stretching and feces to safely manage our ability to get rid of solid waste (feces, poop, stool) out of our body.  BUT, hard  constipated stools can act like ripping concrete bricks and diarrhea can be a burning fire to this very sensitive area of our body, causing inflamed hemorrhoids, anal fissures, increasing risk is perirectal abscesses, abdominal pain/bloating, an making irritable bowel worse.      The goal: ONE SOFT BOWEL MOVEMENT A DAY!  To have soft, regular bowel movements:   Drink plenty of fluids, consider 4-6 tall glasses of water a day.    Take plenty of fiber.  Fiber is the undigested part of plant food that passes into the colon, acting s natures broom to encourage bowel motility and movement.  Fiber can absorb and hold large amounts of water. This results in a larger, bulkier stool, which is soft and easier to pass. Work gradually over several weeks up to 6 servings a day of fiber (25g a day even more if needed) in the form of: o Vegetables -- Root (potatoes, carrots, turnips), leafy green (lettuce, salad greens, celery, spinach), or cooked high residue (cabbage, broccoli, etc) o Fruit -- Fresh (unpeeled skin & pulp), Dried (prunes, apricots, cherries, etc ),  or stewed ( applesauce)  o Whole grain breads, pasta, etc (whole wheat)  o Bran cereals   Bulking Agents -- This type of water-retaining fiber generally is easily obtained each day by one of the following:  o Psyllium bran -- The psyllium plant is remarkable because its ground seeds can retain so much water. This product is available as Metamucil, Konsyl, Effersyllium, Per Diem Fiber, or the less expensive generic preparation in drug and health food stores. Although labeled a laxative, it really is not a laxative.  o Methylcellulose -- This is another fiber derived from wood which also retains water. It is available as Citrucel. o Polyethylene Glycol - and artificial fiber commonly called Miralax or Glycolax.  It is helpful for people with gassy or bloated feelings with regular fiber o Flax Seed - a less gassy fiber than psyllium  No reading or  other relaxing activity while on the toilet. If bowel movements take longer than 5 minutes, you are too constipated  AVOID CONSTIPATION.  High fiber and water intake usually takes care of this.  Sometimes a laxative is needed to stimulate more frequent bowel movements, but  Laxatives are not a good long-term solution as it can wear the colon out.  They can help jump-start bowels if constipated, but should be relied on constantly without discussing with your doctor o Osmotics (Milk of Magnesia, Fleets phosphosoda, Magnesium citrate, MiraLax, GoLytely) are safer than  o Stimulants (Senokot, Castor Oil, Dulcolax, Ex Lax)    o Avoid taking laxatives for more than 7 days in a row.   IF SEVERELY CONSTIPATED, try a Bowel Retraining Program: o Do not use laxatives.  o Eat a diet high in roughage, such as bran cereals and leafy vegetables.  o Drink six (6) ounces of prune or apricot juice each morning.  o Eat two (2) large servings of stewed fruit each day.  o Take one (1) heaping tablespoon of a psyllium-based bulking agent twice a day. Use sugar-free sweetener when possible to avoid excessive calories.  o Eat a normal breakfast.  o Set aside 15 minutes after breakfast to sit on the toilet, but do not strain to have a bowel movement.  o If you do not have a bowel movement by the third day, use an enema and repeat the above steps.   Controlling diarrhea o Switch to liquids and simpler foods for a few days to avoid stressing your intestines further. o Avoid dairy products (especially milk & ice cream) for a short time.  The intestines often can lose the ability to digest lactose when stressed. o Avoid foods that cause gassiness or bloating.  Typical foods include beans and other legumes, cabbage, broccoli, and dairy foods.  Every person has some sensitivity to other foods, so listen to our body and avoid those foods that trigger problems for you. o Adding fiber (Citrucel, Metamucil, psyllium, Miralax)  gradually can help thicken stools by absorbing excess fluid and retrain the intestines to act more normally.  Slowly increase the dose over a few weeks.  Too much fiber too soon can backfire and cause cramping & bloating. o Probiotics (such as active yogurt, Align, etc) may help repopulate the intestines and colon with normal bacteria and calm down a sensitive digestive tract.  Most studies show it to be of mild help, though, and such products can be costly. o Medicines: - Bismuth subsalicylate (ex. Kayopectate, Pepto Bismol) every 30 minutes for up to 6 doses can help control diarrhea.  Avoid if pregnant. - Loperamide (Immodium) can slow down diarrhea.  Start with two tablets (67m total) first and then try one tablet every 6 hours.  Avoid if you are having fevers or severe pain.  If you are not better or start feeling worse, stop all medicines and call your doctor for advice o Call your doctor if you are getting worse or not better.  Sometimes further testing (cultures, endoscopy, X-ray studies, bloodwork, etc) may be needed to help diagnose and treat the cause of the diarrhea.  TROUBLESHOOTING IRREGULAR BOWELS 1) Avoid extremes of bowel movements (no bad constipation/diarrhea) 2) Miralax 17gm mixed in 8oz. water or juice-daily. May use BID as needed.  3) Gas-x,Phazyme, etc. as needed for gas & bloating.  4) Soft,bland diet. No spicy,greasy,fried foods.  5) Prilosec over-the-counter as needed  6) May hold gluten/wheat products from diet to see if symptoms improve.  7)  May try probiotics (Align, Activa, etc) to help calm the bowels down 7) If symptoms become worse call back immediately.  Crohn Disease Crohn disease is a long-lasting (chronic) disease that affects your gastrointestinal (GI) tract. It often causes irritation and swelling (  inflammation) in your small intestine and the beginning of your large intestine. However, it can affect any part of your GI tract. Crohn disease is part of a group  of illnesses that are known as inflammatory bowel disease (IBD). Crohn disease may start slowly and get worse over time. Symptoms may come and go. They may also disappear for months or even years at a time (remission). What are the causes? The exact cause of Crohn disease is not known. It may be a response that causes your body's defense system (immune system) to mistakenly attack healthy cells and tissues (autoimmune response). Your genes and your environment may also play a role. What increases the risk? You may be at greater risk for Crohn disease if you:  Have other family members with Crohn disease or another IBD.  Use any tobacco products, including cigarettes, chewing tobacco, or electronic cigarettes.  Are in your 56s.  Have [place] European ancestry.  What are the signs or symptoms? The main signs and symptoms of Crohn disease involve your GI tract. These include:  Diarrhea.  Rectal bleeding.  An urgent need to move your bowels.  The feeling that you are not finished having a bowel movement.  Abdominal pain or cramping.  Constipation.  General signs and symptoms of Crohn disease may also include:  Unexplained weight loss.  Fatigue.  Fever.  Nausea.  Loss of appetite.  Joint pain  Changes in vision.  Red bumps on your skin.  How is this diagnosed? Your health care provider may suspect Crohn disease based on your symptoms and your medical history. Your health care provider will do a physical exam. You may need to see a health care provider who specializes in diseases of the digestive tract (gastroenterologist). You may also have tests to help your health care providers make a diagnosis. These may include:  Blood tests.  Stool sample tests.  Imaging tests, such as X-rays and CT scans.  Tests to examine the inside of your intestines using a long, flexible tube that has a light and a camera on the end (endoscopy or colonoscopy).  A procedure to take  tissue samples from inside your bowel (biopsy) to be examined under a microscope.  How is this treated? There is no cure for Crohn disease. Treatment will focus on managing your symptoms. Crohn disease affects each person differently. Your treatment may include:  Resting your bowels. Drinking only clear liquids or getting nutrition through an IV for a period of time gives your bowels a chance to heal because they are not passing stools.  Medicines. These may be used alone or in combination (combination therapy). These may include antibiotic medicines. You may be given medicines that help to: ? Reduce inflammation. ? Control your immune system activity. ? Fight infections. ? Relieve cramps and prevent diarrhea. ? Control your pain.  Surgery. You may need surgery if: ? Medicines and other treatments are no longer working. ? You develop complications from severe Crohn disease. ? A section of your intestine becomes so damaged that it needs to be removed.  Follow these instructions at home:  Take medicines only as directed by your health care provider.  If you were prescribed an antibiotic medicine, finish it all even if you start to feel better.  Keep all follow-up visits as directed by your health care provider. This is important.  Talk with your health care provider about changing your diet. This may help your symptoms. Your health care provide may recommend changes, such  as: ? Drinking more fluids. ? Avoiding milk and other foods that contain lactose. ? Eating a low-fat diet. ? Avoiding high-fiber foods, such as popcorn and nuts. ? Avoiding carbonated beverages, such as soda. ? Eating smaller meals more often rather than eating large meals. ? Keeping a food diary to identify foods that make your symptoms better or worse.  Do not use any tobacco products, including cigarettes, chewing tobacco, or electronic cigarettes. If you need help quitting, ask your health care  provider.  Limit alcohol intake to no more than 1 drink per day for nonpregnant women and 2 drinks per day for men. One drink equals 12 ounces of beer, 5 ounces of wine, or 1 ounces of hard liquor.  Exercise daily or as directed by your health care provider. Contact a health care provider if:  You have diarrhea, abdominal cramps, and other gastrointestinal problems that are present almost all of the time.  Your symptoms do not improve with treatment.  You continue to lose weight.  You develop a rash or sores on your skin.  You develop eye problems.  You have a fever.  Your symptoms get worse.  You develop new symptoms. Get help right away if:  You have bloody diarrhea.  You develop severe abdominal pain.  You cannot pass stools. This information is not intended to replace advice given to you by your health care provider. Make sure you discuss any questions you have with your health care provider. Document Released: 01/22/2005 Document Revised: 08/23/2015 Document Reviewed: 11/30/2013 Elsevier Interactive Patient Education  2018 [employer].

## 2017-06-06 MED ORDER — FLUTICASONE PROPIONATE 50 MCG/ACT NA SUSP
1.0000 | Freq: Every day | NASAL | Status: DC
  Administered 2017-06-06 – 2017-06-08 (×3): 1 via NASAL
  Filled 2017-06-06: qty 16

## 2017-06-06 NOTE — Progress Notes (Signed)
[facility]/Virlan Kempker Progress Note 4 Days Post-Op  Subjective: Still having some pain in her right flank.  Better by her report.  Wants to know how long she will be on antibiotics and when she will go home  Objective: Vital signs in last 24 hours: Temp:  [97.7 F (36.5 C)-98.7 F (37.1 C)] 97.7 F (36.5 C) (02/09 0500) Pulse Rate:  [56-65] 56 (02/09 0500) Resp:  [18] 18 (02/09 0500) BP: (134-143)/(52-57) 143/57 (02/09 0500) SpO2:  [98 %-100 %] 100 % (02/09 0500) Last BM Date: 06/06/17  Intake/Output from previous day: 02/08 0701 - 02/09 0700 In: 2425.7 [P.O.:1875; I.V.:250.7; IV Piggyback:300] Out: 365 [Urine:200; Stool:165] Intake/Output this shift: No intake/output data recorded.  General: No acute distress.  Lungs: Clear  Abd: right flank is mildly erythematous, no deep blanching erythema.  Ileostomy is viable with heathy nipple  Extremities: No changes  Neuro: Intact  Lab Results:  @LABLAST2 (wbc:2,hgb:2,hct:2,plt:2) BMET ) Recent Labs    06/04/17 0345 06/04/17 1246  NA  --  135  K  --  4.0  CL  --  105  CO2  --  26  GLUCOSE  --  108*  BUN  --  11  CREATININE 0.78 0.74  CALCIUM  --  8.6*   PT/INR No results for input(s): LABPROT, INR in the last 72 hours. ABG No results for input(s): PHART, HCO3 in the last 72 hours.  Invalid input(s): PCO2, PO2  Studies/Results: No results found.  Anti-infectives: Anti-infectives (From admission, onward)   Start     Dose/Rate Route Frequency Ordered Stop   06/05/17 1000  ceFAZolin (ANCEF) IVPB 2g/100 mL premix     2 g 200 mL/hr over 30 Minutes Intravenous Every 8 hours 06/05/17 0845     06/02/17 1030  cefoTEtan in Dextrose 5% (CEFOTAN) IVPB 2 g     2 g Intravenous On call to O.R. 06/02/17 1021 06/02/17 1130   06/02/17 0829  cefoTEtan in Dextrose 5% (CEFOTAN) 2-2.08 GM-%(50ML) IVPB    Comments:  Waldron Session   : cabinet override      06/02/17 3606 06/02/17 1115   06/01/17 1100  cefoTEtan (CEFOTAN) 1 g in dextrose 5 %  50 mL IVPB     1 g 100 mL/hr over 30 Minutes Intravenous On call to O.R. 06/01/17 1037 06/02/17 0559      Assessment/Plan: s/p Procedure(s): RESECTION OF ILEAL STRICTURE; ILEOSTOMY REVISION  Advance diet Regular diet versus hart healthy.  LOS: 15 days   Kathryne Eriksson. Dahlia Bailiff, MD, FACS [phone] [facility] 06/06/2017

## 2017-06-07 MED ORDER — CEPHALEXIN 250 MG PO CAPS
250.0000 mg | ORAL_CAPSULE | Freq: Four times a day (QID) | ORAL | Status: DC
  Administered 2017-06-07 – 2017-06-09 (×8): 250 mg via ORAL
  Filled 2017-06-07 (×10): qty 1

## 2017-06-07 NOTE — Progress Notes (Signed)
Received message from [facility] and pt is active with [facility], Will need a resumption of care order for [facility]. Jonnie Finner RN CCM Case Mgmt phone [phone]

## 2017-06-07 NOTE — Progress Notes (Signed)
5 Days Post-Op   Subjective/Chief Complaint: Complains of shooting pains that come and go but improving   Objective: Vital signs in last 24 hours: Temp:  [97.8 F (36.6 C)-98.6 F (37 C)] 97.8 F (36.6 C) (02/10 0545) Pulse Rate:  [60-74] 60 (02/10 0545) Resp:  [18] 18 (02/10 0545) BP: (130-145)/(55-63) 136/63 (02/10 0545) SpO2:  [100 %] 100 % (02/10 0545) Last BM Date: 06/06/17  Intake/Output from previous day: 02/09 0701 - 02/10 0700 In: 1030 [P.O.:690; I.V.:240; IV Piggyback:100] Out: 525 [Stool:525] Intake/Output this shift: No intake/output data recorded.  General appearance: alert and cooperative Resp: clear to auscultation bilaterally Cardio: regular rate and rhythm GI: soft, mild tenderness. ostomy pink and productive. flank cellulitis improved  Lab Results:  Recent Labs    06/04/17 1246  WBC 9.7  HGB 12.3  HCT 37.5  PLT 286   BMET Recent Labs    06/04/17 1246  NA 135  K 4.0  CL 105  CO2 26  GLUCOSE 108*  BUN 11  CREATININE 0.74  CALCIUM 8.6*   PT/INR No results for input(s): LABPROT, INR in the last 72 hours. ABG No results for input(s): PHART, HCO3 in the last 72 hours.  Invalid input(s): PCO2, PO2  Studies/Results: No results found.  Anti-infectives: Anti-infectives (From admission, onward)   Start     Dose/Rate Route Frequency Ordered Stop   06/07/17 1200  cephALEXin (KEFLEX) capsule 250 mg     250 mg Oral Every 6 hours 06/07/17 1027     06/05/17 1000  ceFAZolin (ANCEF) IVPB 2g/100 mL premix  Status:  Discontinued     2 g 200 mL/hr over 30 Minutes Intravenous Every 8 hours 06/05/17 0845 06/07/17 1027   06/02/17 1030  cefoTEtan in Dextrose 5% (CEFOTAN) IVPB 2 g     2 g Intravenous On call to O.R. 06/02/17 1021 06/02/17 1130   06/02/17 0829  cefoTEtan in Dextrose 5% (CEFOTAN) 2-2.08 GM-%(50ML) IVPB    Comments:  Waldron Session   : cabinet override      06/02/17 9672 06/02/17 1115   06/01/17 1100  cefoTEtan (CEFOTAN) 1 g in dextrose  5 % 50 mL IVPB     1 g 100 mL/hr over 30 Minutes Intravenous On call to O.R. 06/01/17 1037 06/02/17 0559      Assessment/Plan: s/p Procedure(s): RESECTION OF ILEAL STRICTURE; ILEOSTOMY REVISION  (N/A) Advance diet  Switch to oral abx since wbc normal and no fever Ambulate Consider d/c soon  LOS: 16 days    TOTH III,Kayleigh Broadwell S 06/07/2017

## 2017-06-08 MED ORDER — TRAMADOL HCL 50 MG PO TABS
50.0000 mg | ORAL_TABLET | Freq: Two times a day (BID) | ORAL | Status: DC
  Administered 2017-06-08 – 2017-06-09 (×3): 50 mg via ORAL
  Filled 2017-06-08 (×3): qty 1

## 2017-06-08 MED ORDER — HYDROMORPHONE HCL 2 MG PO TABS
1.0000 mg | ORAL_TABLET | ORAL | Status: DC | PRN
  Filled 2017-06-08: qty 1

## 2017-06-08 NOTE — Progress Notes (Signed)
6 Days Post-Op    CC: Abdominal pain  Subjective: Patient appears to be tolerating diet her ostomy is working well she still having pain and taking p.o. Dilaudid for this.  She has not worked much with PT or OT.  I will have him see her today and aim for discharge in the next 24 hours if she does well. For pain we have her on p.o. Tylenol Neurontin 300 mg 3 times daily she is taking p.o. Dilaudid a couple times a day. P.o. Robaxin 750 mg 4 times a day. She is back on her preadmission dose of prednisone 5 mg daily She has oxycodone, hydrocodone, codeine all listed as allergies. Objective: Vital signs in last 24 hours: Temp:  [98.1 F (36.7 C)-98.3 F (36.8 C)] 98.1 F (36.7 C) (02/11 0616) Pulse Rate:  [63-71] 71 (02/11 0616) Resp:  [16-18] 18 (02/11 0616) BP: (131-144)/(65-73) 133/65 (02/11 0616) SpO2:  [98 %-99 %] 99 % (02/11 0616) Last BM Date: 06/06/17 400 PO 120 IV Ostomy 100 recorded Afebrile, VSS Labs OK on 2/7 Last Radiology -CT 2/1:There has been placement of a catheter into the right enterostomy site. There continue to be scattered air-fluid levels in nondilated and upper normal sized loops of small bowel without a specific lead point for obstruction identified. This could represent a small bowel ileus. No obvious focal regions of transmural inflammation to further suggest active Crohn's disease. 2. 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 3.  Aortic Atherosclerosis (ICD10-I70.0). 4. Mild subsegmental atelectasis in both lower lobes.   Intake/Output from previous day: 02/10 0701 - 02/11 0700 In: 520 [P.O.:400; I.V.:120] Out: 100 [Stool:100] Intake/Output this shift: No intake/output data recorded.  General appearance: alert, cooperative and no distress Resp: clear to auscultation bilaterally GI: soft, sore, site looks great, ileostomy working   Her weight is up 2 KG from last weight on 06/04/17  Lab Results:  No results for  input(s): WBC, HGB, HCT, PLT in the last 72 hours.  BMET No results for input(s): NA, K, CL, CO2, GLUCOSE, BUN, CREATININE, CALCIUM in the last 72 hours. PT/INR No results for input(s): LABPROT, INR in the last 72 hours.  No results for input(s): AST, ALT, ALKPHOS, BILITOT, PROT, ALBUMIN in the last 168 hours.   Lipase     Component Value Date/Time   LIPASE 25 05/21/2017 0049     Prior to Admission medications   Medication Sig Start Date End Date Taking? Authorizing Provider  acetaminophen (TYLENOL) 500 MG tablet You can take 2 tablets every 6 hours as needed for pain.  Use this as your primary pain control.  Use Tramadol as a last resort.  You can buy this over the counter at any drug store.    DO NOT TAKE MORE THAN 4000 MG OF TYLENOL PER DAY.  IT CAN HARM YOUR LIVER. 03/31/17  Yes Earnstine Regal, PA-C  cyanocobalamin (,VITAMIN B-12,) 1000 MCG/ML injection INJECT 1 ML INTRAMUSCULARLY EVERY 30 DAYS 03/31/17  Yes Pyrtle, Lajuan Lines, MD  famotidine (PEPCID) 40 MG tablet Take 1 tablet (40 mg total) by mouth 2 (two) times daily. Patient taking differently: Take 40 mg by mouth daily as needed for heartburn or indigestion.  02/29/16  Yes Meuth, Brooke A, PA-C  FLUoxetine (PROZAC) 20 MG capsule TAKE 1 CAPSULE TWICE DAILY 04/08/17  Yes Pyrtle, Lajuan Lines, MD  meclizine (ANTIVERT) 25 MG tablet Take 1 tablet (25 mg total) by mouth 3 (three) times daily as needed for dizziness. 05/19/17  Yes Carollee Herter, Yvonne R, DO  ondansetron (ZOFRAN ODT) 4 MG disintegrating tablet Take 1 tablet (4 mg total) by mouth every 6 (six) hours as needed for nausea or vomiting. 03/17/17  Yes Pyrtle, Lajuan Lines, MD  predniSONE (DELTASONE) 5 MG tablet Take 5 mg by mouth daily.  06/05/16  Yes [provider]  traMADol (ULTRAM) 50 MG tablet Take 1-2 tablets (50-100 mg total) by mouth every 6 (six) hours as needed for moderate pain or severe pain (pain not relieved by Tylenol). 03/31/17  Yes Earnstine Regal, PA-C  zolpidem (AMBIEN)  10 MG tablet TAKE ONE-HALF TABLET BY MOUTH TO 1 AT BEDTIME AS NEEDED FOR SLEEP 04/01/17  Yes Pyrtle, Lajuan Lines, MD  Certolizumab Pegol (CIMZIA PREFILLED) 2 X 200 MG/ML KIT Talk with Dr. Hilarie Fredrickson and he will tell you when to resume this medicine. 03/31/17   Earnstine Regal, PA-C  simvastatin (ZOCOR) 20 MG tablet Take 1 tablet (20 mg total) by mouth daily. Patient not taking: Reported on 04/13/2017 01/15/17   Carollee Herter, Alferd Apa, DO  traMADol (ULTRAM) 50 MG tablet Take 1-2 tablets (50-100 mg total) by mouth every 6 (six) hours as needed for moderate pain or severe pain (pain not relieved by Tylenol). 06/05/17   Michael Boston, MD    Medications: . acetaminophen  1,000 mg Oral Q6H  . cephALEXin  250 mg Oral Q6H  . enoxaparin (LOVENOX) injection  40 mg Subcutaneous Q24H  . famotidine  40 mg Oral Daily  . feeding supplement  1 Container Oral TID BM  . FLUoxetine  40 mg Oral QHS  . fluticasone  1 spray Each Nare Daily  . gabapentin  300 mg Oral TID  . methocarbamol  750 mg Oral Q6H  . pantoprazole  40 mg Oral QHS  . predniSONE  5 mg Oral Q breakfast  . simvastatin  20 mg Oral Daily  . sodium chloride flush  10-40 mL Intracatheter Q12H  . sodium chloride flush  3 mL Intravenous Q12H    Assessment/Plan SBO at level of ostomy-  ILEOSTOMY stricture causing small bowel obstruction S/P dilation of ileostomy 1/28 Dr. Donne Hazel POD #14 S/P resection of ileostomy stricture and ileostomy revision 2/5 Dr. Johney Maine -POD# 6 -ileostomy having stool and gas output - tolerating soft diet  -continue current pain regimen - mobilize - possibly home in the next 24-48 h if pain controlled and tolerating diet  R flank pain- cellulitis - erythema improving  - more likely reaction to lidocaine possibly? 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  Zella Richer Crohns - followed by Dr. Hilarie Fredrickson, appreciate GI consult  Cimzia/Prednisone pre op - currently on 40m prednisone daily  ID -cefotetan periop, IV zosyn 2/7>2/8 VTE -SCDs, lovenox  FEN -regular diet Foley: None Follow-up: Dr. GJohney Maine  Plan: Get OT and PT to work with her today and be sure she is ambulating safely and go home and be independent around the house.  I am going to put her back on the tramadol and decrease the Dilaudid p.o.  Recheck her labs in the a.m.  We will ask for home OT PT and home health nurse to be set up after the final OT and PT evaluations.         LOS: 17 days    Mingo Siegert 06/08/2017 [phone]

## 2017-06-08 NOTE — Discharge Summary (Signed)
Physician Discharge Summary  Patient ID: Sara Barnes MRN: [id number] DOB/AGE: 05-14-1940 77 y.o.  Admit date: 05/21/2017 Discharge date: 06/09/2017  Admission Diagnoses:  SBO  Severe Crohn's disease with history of prior anorectal fistula -chronic steroid use GERD  Dr. Johnathan Hausen Right colectomy for perforated cecum after colonoscopy, 03/24/2017 Dr. Jackolyn Confer Left breast cancer History of depression History of ulcerative colitis. 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 Johnathan Hausen Right colectomy for perforated cecum after colonoscopy, 03/24/2017 Dr. Jackolyn Confer Left breast cancer History of depression History of ulcerative colitis. B12 anemia Malnutrition and deconditioning  Chronic pain on tramadol at home     Principal Problem:   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 06/02/2017 Active Problems:   Crohn disease ([place])   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   Ileostomy in place    Generalized anxiety disorder   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   PROCEDURES:  S/Pdilation of ileostomy 1/28 Dr. 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 Radene Ou Course:  Sara Barnes is a 77yo female PMH Crohn's disease currently on 89m prednisone daily, [facility] onset of progressive abdominal pain. Patient underwent diverting ileostomy 02/11/16 by Dr. MHassell Doneand right colectomy 03/24/17 by Dr. RZella Richerfor perforated viscous. States that the pain started yesterday after breakfast. It is mostly centered around her ileostomy site but radiates diffusely about her  abdomen. Describes the pain as crampy. Worse with movement or PO intake. She reports multiple episodes of nausea and vomiting. States that she had decreased ileostomy output yesterday, but within the last couple of hours being in the ED she has had a large amount of liquid output.  In the ED she had a CT scan that showed a small bowel obstruction with transition point at the level of the ostomy. NG tube has been placed. WBC 14.6, afebrile. She was admitted with a partial small bowel obstruction seen in consultation by Dr. MLucio Edward  They did not think she had any inflammatory changes on the CT to suggest active Crohn's.  An NG was placed, her ileostomy started working and the following a.m. she is feeling a little better.  X-ray that a.m. showed no bowel dilatation or air-fluid levels to suggest obstruction.  Her NG was clamped after the first day and she was stable for the next several days.  A Foley was placed in the ostomy to help with drainage and this improved her symptoms, but she continued to have some abdominal pain.  Even with the Foley in nausea and vomiting returned overnight on 05/24/2017.  She was seen by Dr. WDonne Hazeland taken the operating room on 128 and underwent dilatation of the ostomy using his finger.  After completion of a an 138French Foley was placed in the ostomy and then the appliance was replaced.  This improved her symptoms but she continued to have some abdominal discomfort and intermittent pain.  On 05/28/17 she was still complaining of abdominal pain and nausea.  She was additionally unable to walk because of the ongoing nausea.  Because of her ongoing nausea and inability to eat she was started on TNA.  A Foley  was again placed in the ostomy and this seemed to improve her symptoms.  On 2 5 she continued to have some generalized symptoms without any significant improvement.  She was seen and evaluated by Dr. gross and taken the operating room later that day underwent resection of  the ileal stricture with ileostomy revision.  Postoperatively she is made fairly good progress.  Pain remained an issue but the nausea resolved.  We weaned her off her TNA, and advanced her diet.  Developed some right flank pain and erythema this was treated with Keflex.  She has  continued to tolerate a soft diet, and her ostomy continues to work well.  She had been transitioned to p.o. Dilaudid.  We had her on plain Tylenol, Toradol and Robaxin, and she came off the p.o. Dilaudid.  She did find a new nodule that is rounded and measured 1 cm at approximately the 6 o'clock position on her ostomy.  This was reviewed by Dr. gross and he did not believe anything needed to be done currently.  We would watch it in the office, and allow GI to follow also.  On the a.m. of 06/09/17 she was doing well.  She was seen by OT and PT and no further home health was recommended.  Her ileostomy is working well.  She is tolerating the pain well with lower doses of tramadol, Tylenol, and Robaxin.  She is tolerating soft diet without difficulty and was ready for discharge.  CBC Latest Ref Rng & Units 06/09/2017 06/04/2017 06/01/2017  WBC 4.0 - 10.5 K/uL 10.4 9.7 9.3  Hemoglobin 12.0 - 15.0 g/dL 11.8(L) 12.3 12.0  Hematocrit 36.0 - 46.0 % 34.9(L) 37.5 37.1  Platelets 150 - 400 K/uL 303 286 242   CMP Latest Ref Rng & Units 06/09/2017 06/04/2017 06/04/2017  Glucose 65 - 99 mg/dL 87 108(H) -  BUN 6 - 20 mg/dL 11 11 -  Creatinine 0.44 - 1.00 mg/dL 0.80 0.74 0.78  Sodium 135 - 145 mmol/L 138 135 -  Potassium 3.5 - 5.1 mmol/L 4.0 4.0 -  Chloride 101 - 111 mmol/L 107 105 -  CO2 22 - 32 mmol/L 22 26 -  Calcium 8.9 - 10.3 mg/dL 8.8(L) 8.6(L) -  Total Protein 6.5 - 8.1 g/dL 5.6(L) - -  Total Bilirubin 0.3 - 1.2 mg/dL 0.1(L) - -  Alkaline Phos 38 - 126 U/L 89 - -  AST 15 - 41 U/L 22 - -  ALT 14 - 54 U/L 17 - -    Condition on discharge: Improved.  She would like to follow-up with Dr. Hassell Done who did her original  ileostomy.  Disposition: 06-Home-Health Care Svc  Discharge Instructions    Call MD for:   Complete by:  As directed    FEVER > 101.5 F  (temperatures < 101.5 F are not significant)   Call MD for:  extreme fatigue   Complete by:  As directed    Call MD for:  persistant dizziness or light-headedness   Complete by:  As directed    Call MD for:  persistant nausea and vomiting   Complete by:  As directed    Call MD for:  redness, tenderness, or signs of infection (pain, swelling, redness, odor or green/yellow discharge around incision site)   Complete by:  As directed    Call MD for:  severe uncontrolled pain   Complete by:  As directed    Diet - low sodium heart healthy   Complete by:  As  directed    Follow a light diet the first few days at home.   Start with a bland diet such as soups, liquids, starchy foods, low fat foods, etc.   If you feel full, bloated, or constipated, stay on a full liquid or pureed/blenderized diet for a few days until you feel better and no longer constipated. Be sure to drink plenty of fluids every day to avoid getting dehydrated (feeling dizzy, not urinating, etc.). Gradually add a fiber supplement to your diet   Discharge instructions   Complete by:  As directed    See Discharge Instructions If you are not getting better after two weeks or are noticing you are getting worse, contact our office [phone] for further advice.  We may need to adjust your medications, re-evaluate you in the office, send you to the emergency room, or see what other things we can do to help. The clinic staff is available to answer your questions during regular business hours (8:30am-5pm).  Please don't hesitate to call and ask to speak to one of our nurses for clinical concerns.    A surgeon from [facility] is always on call at the hospitals 24 hours/day If you have a medical emergency, go to the nearest emergency room or call 911.   Driving Restrictions    Complete by:  As directed    You may drive when you are no longer taking narcotic prescription pain medication, you can comfortably wear a seatbelt, and you can safely make sudden turns/stops to protect yourself without hesitating due to pain.   Increase activity slowly   Complete by:  As directed    Start light daily activities --- self-care, walking, climbing stairs- beginning the day after surgery.  Gradually increase activities as tolerated.  Control your pain to be active.  Stop when you are tired.  Ideally, walk several times a day, eventually an hour a day.   Most people are back to most day-to-day activities in a few weeks.  It takes 4-8 weeks to get back to unrestricted, intense activity. If you can walk 30 minutes without difficulty, it is safe to try more intense activity such as jogging, treadmill, bicycling, low-impact aerobics, swimming, etc. Save the most intensive and strenuous activity for last (Usually 4-8 weeks after surgery) such as sit-ups, heavy lifting, contact sports, etc.  Refrain from any intense heavy lifting or straining until you are off narcotics for pain control.  You will have off days, but things should improve week-by-week. DO NOT PUSH THROUGH PAIN.  Let pain be your guide: If it hurts to do something, don't do it.  Pain is your body warning you to avoid that activity for another week until the pain goes down.   Lifting restrictions   Complete by:  As directed    If you can walk 30 minutes without difficulty, it is safe to try more intense activity such as jogging, treadmill, bicycling, low-impact aerobics, swimming, etc. Save the most intensive and strenuous activity for last (Usually 4-8 weeks after surgery) such as sit-ups, heavy lifting, contact sports, etc.  Refrain from any intense heavy lifting or straining until you are off narcotics for pain control.  You will have off days, but things should improve week-by-week. DO NOT PUSH THROUGH PAIN.  Let pain be your  guide: If it hurts to do something, don't do it.  Pain is your body warning you to avoid that activity for another week until the pain goes down.  May walk up steps   Complete by:  As directed    No wound care   Complete by:  As directed    It is good for closed incision and even open wounds to be washed every day.  Shower every day.  Short baths are fine.  Wash the incisions and wounds clean with soap & water.    If you have a closed incision(s), wash the incision with soap & water every day.  You may leave closed incisions open to air if it is dry.   You may cover the incision with clean gauze & replace it after your daily shower for comfort. If you have skin tapes (Steristrips) or skin glue (Dermabond) on your incision, leave them in place.  They will fall off on their own like a scab.  You may trim any edges that curl up with clean scissors.  If you have staples, set up an appointment for them to be removed in the office in 10 days after surgery.  If you have a drain, wash around the skin exit site with soap & water and place a new dressing of gauze or band aid around the skin every day.  Keep the drain site clean & dry.   Sexual Activity Restrictions   Complete by:  As directed    You may have sexual intercourse when it is comfortable. If it hurts to do something, stop.     Allergies as of 06/09/2017      Reactions   Codeine Nausea And Vomiting   Mesalamine Nausea And Vomiting   Nsaids    Crohn's disease = NO NSAIDs   Oxycodone Itching, Rash   Norco [hydrocodone-acetaminophen] Nausea Only   Other Itching   PERFUMED LOTIONS Cant have MRI due to ear implant    Sulfa Antibiotics Rash      Medication List    TAKE these medications   acetaminophen 500 MG tablet Commonly known as:  TYLENOL You can take 2 tablets every 6 hours as needed for pain.  Use this as your primary pain control.  Use Tramadol as a last resort.  You can buy this over the counter at any drug store.    DO NOT  TAKE MORE THAN 4000 MG OF TYLENOL PER DAY.  IT CAN HARM YOUR LIVER.   Certolizumab Pegol 2 X 200 MG/ML Kit Commonly known as:  CIMZIA PREFILLED Talk with Dr. Hilarie Fredrickson and he will tell you when to resume this medicine.   cyanocobalamin 1000 MCG/ML injection Commonly known as:  (VITAMIN B-12) INJECT 1 ML INTRAMUSCULARLY EVERY 30 DAYS   famotidine 40 MG tablet Commonly known as:  PEPCID Take 1 tablet (40 mg total) by mouth 2 (two) times daily. What changed:    when to take this  reasons to take this   FLUoxetine 20 MG capsule Commonly known as:  PROZAC TAKE 1 CAPSULE TWICE DAILY   meclizine 25 MG tablet Commonly known as:  ANTIVERT Take 1 tablet (25 mg total) by mouth 3 (three) times daily as needed for dizziness.   methocarbamol 750 MG tablet Commonly known as:  ROBAXIN Take 1 tablet (750 mg total) by mouth 3 (three) times daily for 7 days.   ondansetron 4 MG disintegrating tablet Commonly known as:  ZOFRAN ODT Take 1 tablet (4 mg total) by mouth every 6 (six) hours as needed for nausea or vomiting.   predniSONE 5 MG tablet Commonly known as:  DELTASONE Take 5 mg by mouth daily.  simvastatin 20 MG tablet Commonly known as:  ZOCOR Take 1 tablet (20 mg total) by mouth daily.   traMADol 50 MG tablet Commonly known as:  ULTRAM Take 1-2 tablets (50-100 mg total) by mouth every 6 (six) hours as needed for moderate pain or severe pain (pain not relieved by Tylenol).   zolpidem 10 MG tablet Commonly known as:  AMBIEN TAKE ONE-HALF TABLET BY MOUTH TO 1 AT BEDTIME AS NEEDED FOR SLEEP      Follow-up Information    [facility] Follow up.   Specialty:  Home Health Services Why:  Home Health RN - will call to arrange initial appointment Contact information: [place] [address] [phone]        Johnathan Hausen, MD Follow up.   Specialty:  General Surgery Why:  Office will call you with follow up appointment with Dr. Hassell Done.  If you  don't hear from them call later this week for follow up in 2 weeks.   Contact information: [address]  [place] [address] [phone]           Signed: Earnstine Regal 06/09/2017, 11:33 AM

## 2017-06-08 NOTE — Progress Notes (Signed)
Pt reports to nurse that she had gotten up to "go to the restroom by myself, and I hit my left arm on the bathroom door, I do stuff like this all the time at home, and my skin is so thin due to taking steroids. " Pt adamantly denies having any falls in room, approx 2.5 cm skin tear noted to left forearm, area cleansed with sterile gauze and normal saline, transparent dressing applied.

## 2017-06-08 NOTE — Consult Note (Signed)
[facility] Nurse ostomy consult note Stoma type/location: RUQ ileostomy Stomal assessment/size: 1 and 5/8 inch round, raised, os at center.  New nodule at 6 o'clock measuring 1cm round noted. Peristomal assessment: intact, clear Treatment options for stomal/peristomal skin: skin barrier ring Output: brown/green effluent.  Ostomy pouching: 1pc convex pouching system with skin barrier ring  Education provided: Patient and sister instructed that these skin blebs are not uncommon and that surgeons differ in their treatment.  Some will leave the area and I note that stomas are "as individualized as the noses on our faces".  Others treat with AgNO3 (chemically cauterize) or remove with a scalpel and then chemically cauterize to stop the temporary bleeding. Drs. Johnathan Hausen or Alwyn Pea will evaluate when in office in the next couple of weeks. New pouching system applied and patient and sister are relieved and reassured. HHRN to see post discharge. Enrolled patient in [facility] program: Yes, previously  [facility] nursing team will follow, and will remain available to this patient, the nursing, surgical and medical teams.  Thanks, Maudie Flakes, MSN, RN, [place], Arther Abbott  Pager# [phone]

## 2017-06-08 NOTE — Progress Notes (Signed)
Spoke with Modena Jansky, [place] for surgical team. Saw pt with Dr. Carlean Purl, she can remain on her current outpatient Prednisone dose for now. Arranged for outpatient follow up with Dr. Hilarie Fredrickson. Current appt April 29 at 2:00pm, but will contact his nurse about getting this moved up.   Ellouise Newer, PA-C [facility]

## 2017-06-09 LAB — COMPREHENSIVE METABOLIC PANEL
ALK PHOS: 89 U/L (ref 38–126)
ALT: 17 U/L (ref 14–54)
AST: 22 U/L (ref 15–41)
Albumin: 2.7 g/dL — ABNORMAL LOW (ref 3.5–5.0)
Anion gap: 9 (ref 5–15)
BUN: 11 mg/dL (ref 6–20)
CO2: 22 mmol/L (ref 22–32)
CREATININE: 0.8 mg/dL (ref 0.44–1.00)
Calcium: 8.8 mg/dL — ABNORMAL LOW (ref 8.9–10.3)
Chloride: 107 mmol/L (ref 101–111)
Glucose, Bld: 87 mg/dL (ref 65–99)
Potassium: 4 mmol/L (ref 3.5–5.1)
Sodium: 138 mmol/L (ref 135–145)
TOTAL PROTEIN: 5.6 g/dL — AB (ref 6.5–8.1)
Total Bilirubin: 0.1 mg/dL — ABNORMAL LOW (ref 0.3–1.2)

## 2017-06-09 LAB — CBC
HCT: 34.9 % — ABNORMAL LOW (ref 36.0–46.0)
HEMOGLOBIN: 11.8 g/dL — AB (ref 12.0–15.0)
MCH: 32.9 pg (ref 26.0–34.0)
MCHC: 33.8 g/dL (ref 30.0–36.0)
MCV: 97.2 fL (ref 78.0–100.0)
Platelets: 303 10*3/uL (ref 150–400)
RBC: 3.59 MIL/uL — AB (ref 3.87–5.11)
RDW: 12.9 % (ref 11.5–15.5)
WBC: 10.4 10*3/uL (ref 4.0–10.5)

## 2017-06-09 MED ORDER — TRAMADOL HCL 50 MG PO TABS: 50.0000 mg | ORAL_TABLET | Freq: Two times a day (BID) | ORAL | 1 refills | Status: DC

## 2017-06-09 MED ORDER — METHOCARBAMOL 750 MG PO TABS: 750.0000 mg | ORAL_TABLET | Freq: Three times a day (TID) | ORAL | 0 refills | Status: AC

## 2017-06-09 MED ORDER — TRAMADOL HCL 50 MG PO TABS: 50.0000 mg | ORAL_TABLET | Freq: Four times a day (QID) | ORAL | 0 refills | Status: DC | PRN

## 2017-06-09 MED ORDER — METHOCARBAMOL 750 MG PO TABS: 750.0000 mg | ORAL_TABLET | Freq: Three times a day (TID) | ORAL | 0 refills | Status: DC

## 2017-06-09 NOTE — Progress Notes (Signed)
Discharge instructions discussed with patient and sister, verbalized agreement and understanding, prescriptions given to patient

## 2017-06-09 NOTE — Progress Notes (Signed)
7 Days Post-Op    CC: SBO  Subjective: Patient is doing well this a.m.  She has been seen by OT and PT and they do not think she needs further treatment at home.  Her ileostomy is working well.  Objective: Vital signs in last 24 hours: Temp:  [97.8 F (36.6 C)-98.4 F (36.9 C)] 97.8 F (36.6 C) (02/12 0457) Pulse Rate:  [70-74] 74 (02/12 0457) Resp:  [17-18] 17 (02/12 0457) BP: (124-169)/(55-84) 169/84 (02/12 0457) SpO2:  [100 %] 100 % (02/12 0457) Weight:  [74 kg (163 lb 2.3 oz)] 74 kg (163 lb 2.3 oz) (02/11 1027) Last BM Date: 06/08/17 920 PO Urine x 2 recorded Stool 575 yesterday Afebrile, VSS Labs OK Pain control:  Tylenol 3000   Dilaudid 4 mg tab x 2 - none after we talked   Robaxin 750 x 4   Tramadol x 3 yesterday and one today   Intake/Output from previous day: 02/11 0701 - 02/12 0700 In: 923 [P.O.:920; I.V.:3] Out: 575 [Stool:575] Intake/Output this shift: Total I/O In: 240 [P.O.:240] Out: 300 [Stool:300]  General appearance: alert, cooperative and no distress Resp: clear to auscultation bilaterally GI: Abdominal wounds look good.  Her ileostomy is working there is a picture of the new nodule noted on the ileostomy   Picture of the nodule noted in hospital yesterday, reviewed by Dr.Gross, we can follow in the office.   Lab Results:  Recent Labs    06/09/17 0348  WBC 10.4  HGB 11.8*  HCT 34.9*  PLT 303    BMET Recent Labs    06/09/17 0348  NA 138  K 4.0  CL 107  CO2 22  GLUCOSE 87  BUN 11  CREATININE 0.80  CALCIUM 8.8*   PT/INR No results for input(s): LABPROT, INR in the last 72 hours.  Recent Labs  Lab 06/09/17 0348  AST 22  ALT 17  ALKPHOS 89  BILITOT 0.1*  PROT 5.6*  ALBUMIN 2.7*     Lipase     Component Value Date/Time   LIPASE 25 05/21/2017 0049     Medications: . acetaminophen  1,000 mg Oral Q6H  . cephALEXin  250 mg Oral Q6H  . enoxaparin (LOVENOX) injection  40 mg Subcutaneous Q24H  . famotidine  40 mg Oral  Daily  . feeding supplement  1 Container Oral TID BM  . FLUoxetine  40 mg Oral QHS  . fluticasone  1 spray Each Nare Daily  . gabapentin  300 mg Oral TID  . methocarbamol  750 mg Oral Q6H  . pantoprazole  40 mg Oral QHS  . predniSONE  5 mg Oral Q breakfast  . simvastatin  20 mg Oral Daily  . sodium chloride flush  10-40 mL Intracatheter Q12H  . sodium chloride flush  3 mL Intravenous Q12H  . traMADol  50 mg Oral Q12H    Assessment/Plan SBO at level of ostomy- ILEOSTOMYstricture causing small bowel obstruction S/P dilation of ileostomy 1/28 Dr. Donne Hazel POD #14 S/P resection of ileostomy stricture and ileostomy revision 2/5 Dr. Johney Maine -POD# 7 - New nodule at 6 o'clock measuring 1cm round noted - reviewed by Dr. Johney Maine, no intervention at this time, will watch in the office -ileostomy having stool and gas output - tolerating soft diet  -continue current pain regimen - mobilize - possibly home in the next 24-48 h if pain controlled and tolerating diet  R flank pain- cellulitis - erythema improving  - more likely reaction to lidocaine possibly? Than  cellulitis - heat/ice prn  Hx ofPerforated cecum after colonoscopy/severe Crohn's disease with prior anorectal fistula Diagnostic laparoscopy, exploratory laparotomy, right colectomy11/27/18, Dr. Zella Richer Crohns - followed by Dr. Hilarie Fredrickson, appreciate GI consult  Cimzia/Prednisone pre op - currently on 41m prednisone daily  ID -cefotetan periop, IV zosyn 2/7>2/8; Keflex 2/10 day 3 VTE -SCDs, lovenox  FEN -regular diet Foley: None Follow-up:Dr. Gross   Plan: Patient would like to go home today.  I reviewed the new nodule noted on her ileostomy with Dr. GJohney Maine  It was his opinion there was nothing to do for right now we will just watch it.  She feels that the tramadol is giving her adequate pain control along with Tylenol and Robaxin.  She thinks the Robaxin works best of all.  I have personally reviewed the patients  medication history on the [place] controlled substance database.       LOS: 18 days    Kieon Lawhorn 06/09/2017 [phone]

## 2017-06-09 NOTE — Progress Notes (Signed)
Occupational Therapy Screening/Cancellation Note  Patient Details Name: Sara Barnes MRN: [id number] DOB: 1940/09/29   Screening/Cancelled Treatment:     Reason pt not seen/Evaluation not complete: Orders received, chart reviewed, followed by discussion with PT indicates that pt does not currently have any acute OT needs at this time. PT reports observing ADL's/toileting, emptying her bag independently, donning/doffing socks during their assessment. Brief discussion indicates that pt also has DME (Shower chair/3:1 etc) if needed in the future. She will have family PRN assist at d/c. Will sign off acute OT at this time.  Carlynn Herald, Amy Beth Dixon, OTR/L 06/09/2017, 9:25 AM

## 2017-06-09 NOTE — Evaluation (Signed)
Physical Therapy Evaluation Patient Details Name: Sara Barnes MRN: [id number] DOB: [birth year] Today's Date: 06/09/2017   History of Present Illness  Patient is a 77 y/o female with h/o Crohn's disease, R hemicolectomy, admitted with SBO at level of ostomy -  ILEOSTOMY stricture causing small bowel obstruction.  S/P dilation of ileostomy 1/28; S/P resection of ileostomy stricture and ileostomy revision 2/5.  Clinical Impression  Patient presents with deconditioning due to prolonged hospitalization and surgeries.  Feel she will continue to progress at home under the care of family without formalized PT.  Planned d/c home today.  No further PT needs.     Follow Up Recommendations No PT follow up;Supervision/Assistance - 24 hour    Equipment Recommendations  None recommended by PT    Recommendations for Other Services       Precautions / Restrictions Precautions Precautions: Fall Precaution Comments: reviewed fall prevention information including lighting, footwear, removing environmental hazards, etc      Mobility  Bed Mobility Overal bed mobility: Modified Independent             General bed mobility comments: performed initially with bed rail and HOB elevated, then with HOB flat, no rails and to side she gets up on at home  Transfers Overall transfer level: Needs assistance Equipment used: Rolling walker (2 wheeled);None Transfers: Sit to/from Stand Sit to Stand: Supervision         General transfer comment: assist for safety up from EOB, then in bathroom without assist using grabbar,   Ambulation/Gait Ambulation/Gait assistance: Supervision;Modified independent (Device/Increase time) Ambulation Distance (Feet): 400 Feet Assistive device: Rolling walker (2 wheeled);None Gait Pattern/deviations: Step-through pattern;Decreased stride length;Trunk flexed     General Gait Details: cues for posture, in room using furniture for stability, walker in hallway  Stairs             Wheelchair Mobility    Modified Rankin (Stroke Patients Only)       Balance Overall balance assessment: Needs assistance   Sitting balance-Leahy Scale: Good     Standing balance support: No upper extremity supported Standing balance-Leahy Scale: Fair Standing balance comment: washed hands, stood for donning robe and performed perineal hygiene, emptying of colostomy bag without assist                             Pertinent Vitals/Pain Pain Assessment: 0-10 Pain Score: 6  Pain Location: abdomen Pain Descriptors / Indicators: Sore Pain Intervention(s): Monitored during session;Repositioned    Home Living Family/patient expects to be discharged to:: Private residence Living Arrangements: Alone Available Help at Discharge: Family;Available 24 hours/day Type of Home: House(townhouse) Home Access: Level entry     Home Layout: Two level;Able to live on main level with bedroom/bathroom Home Equipment: Gilford Rile - 2 wheels;Cane - single point;Bedside commode;Shower seat;Hand held shower head Additional Comments: sister can stay with her as needed    Prior Function Level of Independence: Independent;Independent with assistive device(s)         Comments: using RW when first home from hospital     Hand Dominance   Dominant Hand: Right    Extremity/Trunk Assessment   Upper Extremity Assessment Upper Extremity Assessment: Overall WFL for tasks assessed    Lower Extremity Assessment Lower Extremity Assessment: Generalized weakness       Communication   Communication: HOH  Cognition Arousal/Alertness: Awake/alert Behavior During Therapy: WFL for tasks assessed/performed Overall Cognitive Status: Within Functional Limits for tasks assessed  General Comments General comments (skin integrity, edema, etc.): spoke with OT regarding ADL performance to allow for screen    Exercises      Assessment/Plan    PT Assessment Patent does not need any further PT services  PT Problem List         PT Treatment Interventions      PT Goals (Current goals can be found in the Care Plan section)  Acute Rehab PT Goals PT Goal Formulation: All assessment and education complete, DC therapy    Frequency     Barriers to discharge        Co-evaluation               AM-PAC PT "6 Clicks" Daily Activity  Outcome Measure Difficulty turning over in bed (including adjusting bedclothes, sheets and blankets)?: None Difficulty moving from lying on back to sitting on the side of the bed? : A Little Difficulty sitting down on and standing up from a chair with arms (e.g., wheelchair, bedside commode, etc,.)?: A Little Help needed moving to and from a bed to chair (including a wheelchair)?: A Little Help needed walking in hospital room?: A Little Help needed climbing 3-5 steps with a railing? : A Little 6 Click Score: 19    End of Session   Activity Tolerance: Patient tolerated treatment well Patient left: with call bell/phone within reach;in chair   PT Visit Diagnosis: Muscle weakness (generalized) (M62.81)    Time: 3244-0102 PT Time Calculation (min) (ACUTE ONLY): 23 min   Charges:   PT Evaluation $PT Eval Low Complexity: 1 Low PT Treatments $Self Care/Home Management: 8-22   PT G CodesMagda Kiel, [place] 06/09/2017   Reginia Naas 06/09/2017, 10:14 AM

## 2017-06-12 ENCOUNTER — Telehealth: Payer: Self-pay | Admitting: Internal Medicine

## 2017-06-12 DIAGNOSIS — K9413 Enterostomy malfunction: Secondary | ICD-10-CM | POA: Diagnosis not present

## 2017-06-12 DIAGNOSIS — I4891 Unspecified atrial fibrillation: Secondary | ICD-10-CM | POA: Diagnosis not present

## 2017-06-12 DIAGNOSIS — K589 Irritable bowel syndrome without diarrhea: Secondary | ICD-10-CM | POA: Diagnosis not present

## 2017-06-12 DIAGNOSIS — K5090 Crohn's disease, unspecified, without complications: Secondary | ICD-10-CM | POA: Diagnosis not present

## 2017-06-12 DIAGNOSIS — I5032 Chronic diastolic (congestive) heart failure: Secondary | ICD-10-CM | POA: Diagnosis not present

## 2017-06-12 DIAGNOSIS — K449 Diaphragmatic hernia without obstruction or gangrene: Secondary | ICD-10-CM | POA: Diagnosis not present

## 2017-06-12 NOTE — Telephone Encounter (Signed)
Called and spoke with Sara Barnes and let her know to contact [facility] for the orders.

## 2017-06-15 ENCOUNTER — Other Ambulatory Visit: Payer: Self-pay | Admitting: *Deleted

## 2017-06-15 DIAGNOSIS — K50113 Crohn's disease of large intestine with fistula: Secondary | ICD-10-CM

## 2017-06-15 MED ORDER — ZOLPIDEM TARTRATE 10 MG PO TABS: ORAL_TABLET | ORAL | 1 refills | Status: DC

## 2017-06-16 DIAGNOSIS — K589 Irritable bowel syndrome without diarrhea: Secondary | ICD-10-CM | POA: Diagnosis not present

## 2017-06-16 DIAGNOSIS — K5090 Crohn's disease, unspecified, without complications: Secondary | ICD-10-CM | POA: Diagnosis not present

## 2017-06-16 DIAGNOSIS — I4891 Unspecified atrial fibrillation: Secondary | ICD-10-CM | POA: Diagnosis not present

## 2017-06-16 DIAGNOSIS — K449 Diaphragmatic hernia without obstruction or gangrene: Secondary | ICD-10-CM | POA: Diagnosis not present

## 2017-06-16 DIAGNOSIS — I5032 Chronic diastolic (congestive) heart failure: Secondary | ICD-10-CM | POA: Diagnosis not present

## 2017-06-16 DIAGNOSIS — K9413 Enterostomy malfunction: Secondary | ICD-10-CM | POA: Diagnosis not present

## 2017-06-16 NOTE — Telephone Encounter (Signed)
Refill created in error. Rx destroyed. [employer]. Sent a prior authorization request over for ambien on this patient and the request resembled refill request. After speaking to pharmacy, this was also sent in error as patient had just gotten their last prescription.

## 2017-06-18 DIAGNOSIS — K449 Diaphragmatic hernia without obstruction or gangrene: Secondary | ICD-10-CM | POA: Diagnosis not present

## 2017-06-18 DIAGNOSIS — I5032 Chronic diastolic (congestive) heart failure: Secondary | ICD-10-CM | POA: Diagnosis not present

## 2017-06-18 DIAGNOSIS — K5090 Crohn's disease, unspecified, without complications: Secondary | ICD-10-CM | POA: Diagnosis not present

## 2017-06-18 DIAGNOSIS — K9413 Enterostomy malfunction: Secondary | ICD-10-CM | POA: Diagnosis not present

## 2017-06-18 DIAGNOSIS — K589 Irritable bowel syndrome without diarrhea: Secondary | ICD-10-CM | POA: Diagnosis not present

## 2017-06-18 DIAGNOSIS — I4891 Unspecified atrial fibrillation: Secondary | ICD-10-CM | POA: Diagnosis not present

## 2017-06-22 DIAGNOSIS — K5090 Crohn's disease, unspecified, without complications: Secondary | ICD-10-CM | POA: Diagnosis not present

## 2017-06-22 DIAGNOSIS — Z932 Ileostomy status: Secondary | ICD-10-CM | POA: Diagnosis not present

## 2017-06-28 ENCOUNTER — Other Ambulatory Visit: Payer: Self-pay | Admitting: Internal Medicine

## 2017-06-28 DIAGNOSIS — K6289 Other specified diseases of anus and rectum: Secondary | ICD-10-CM

## 2017-06-28 DIAGNOSIS — K51911 Ulcerative colitis, unspecified with rectal bleeding: Secondary | ICD-10-CM

## 2017-06-28 DIAGNOSIS — E538 Deficiency of other specified B group vitamins: Secondary | ICD-10-CM

## 2017-06-30 DIAGNOSIS — K589 Irritable bowel syndrome without diarrhea: Secondary | ICD-10-CM | POA: Diagnosis not present

## 2017-06-30 DIAGNOSIS — I4891 Unspecified atrial fibrillation: Secondary | ICD-10-CM | POA: Diagnosis not present

## 2017-06-30 DIAGNOSIS — I5032 Chronic diastolic (congestive) heart failure: Secondary | ICD-10-CM | POA: Diagnosis not present

## 2017-06-30 DIAGNOSIS — K449 Diaphragmatic hernia without obstruction or gangrene: Secondary | ICD-10-CM | POA: Diagnosis not present

## 2017-06-30 DIAGNOSIS — K5090 Crohn's disease, unspecified, without complications: Secondary | ICD-10-CM | POA: Diagnosis not present

## 2017-06-30 DIAGNOSIS — K9413 Enterostomy malfunction: Secondary | ICD-10-CM | POA: Diagnosis not present

## 2017-07-08 ENCOUNTER — Other Ambulatory Visit: Payer: Self-pay | Admitting: Internal Medicine

## 2017-07-08 DIAGNOSIS — K50113 Crohn's disease of large intestine with fistula: Secondary | ICD-10-CM

## 2017-07-10 DIAGNOSIS — K589 Irritable bowel syndrome without diarrhea: Secondary | ICD-10-CM | POA: Diagnosis not present

## 2017-07-10 DIAGNOSIS — K449 Diaphragmatic hernia without obstruction or gangrene: Secondary | ICD-10-CM | POA: Diagnosis not present

## 2017-07-10 DIAGNOSIS — I5032 Chronic diastolic (congestive) heart failure: Secondary | ICD-10-CM | POA: Diagnosis not present

## 2017-07-10 DIAGNOSIS — K5090 Crohn's disease, unspecified, without complications: Secondary | ICD-10-CM | POA: Diagnosis not present

## 2017-07-10 DIAGNOSIS — I4891 Unspecified atrial fibrillation: Secondary | ICD-10-CM | POA: Diagnosis not present

## 2017-07-10 DIAGNOSIS — K9413 Enterostomy malfunction: Secondary | ICD-10-CM | POA: Diagnosis not present

## 2017-07-17 DIAGNOSIS — K5090 Crohn's disease, unspecified, without complications: Secondary | ICD-10-CM | POA: Diagnosis not present

## 2017-07-17 DIAGNOSIS — Z932 Ileostomy status: Secondary | ICD-10-CM | POA: Diagnosis not present

## 2017-08-03 ENCOUNTER — Telehealth: Payer: Self-pay | Admitting: *Deleted

## 2017-08-03 DIAGNOSIS — Z932 Ileostomy status: Secondary | ICD-10-CM | POA: Diagnosis not present

## 2017-08-03 DIAGNOSIS — K625 Hemorrhage of anus and rectum: Secondary | ICD-10-CM | POA: Diagnosis not present

## 2017-08-03 DIAGNOSIS — K50119 Crohn's disease of large intestine with unspecified complications: Secondary | ICD-10-CM | POA: Diagnosis not present

## 2017-08-03 DIAGNOSIS — R198 Other specified symptoms and signs involving the digestive system and abdomen: Secondary | ICD-10-CM | POA: Diagnosis not present

## 2017-08-03 DIAGNOSIS — R5383 Other fatigue: Secondary | ICD-10-CM | POA: Diagnosis not present

## 2017-08-03 DIAGNOSIS — Z853 Personal history of malignant neoplasm of breast: Secondary | ICD-10-CM | POA: Diagnosis not present

## 2017-08-03 MED ORDER — PREDNISONE 10 MG PO TABS: 10.0000 mg | ORAL_TABLET | Freq: Every day | ORAL | 0 refills | Status: DC

## 2017-08-03 NOTE — Telephone Encounter (Signed)
I have spoken to patient to advise of upcoming appointment with Dr Hilarie Fredrickson on 08/13/17 at 930 am as well as her need to begin prednisone 10 mg daily to help her colitis until she is seen in office by [place]. Patient verbalizes understanding. Patient also asks about her Lorrin Mais being denied. I explained that a script was sent on 06/15/17 for #30 w/1 refill which should last until 08/13/17. Patient states that she was told rx was denied so she never picked up a script. I have contacted [facility] and spoke with pharmacist, Pam to discuss. She states last script she got was on12/5/18 for #30 with 2 refills. I have given verbal order for #30 with 2 additional refills.

## 2017-08-03 NOTE — Telephone Encounter (Signed)
-----   Message from Jerene Bears, MD sent at 08/03/2017 11:40 AM EDT ----- I can do it Dottie Prednisone 10 mg daily until she sees me JMP  ----- Message ----- From: [name], MD Sent: 08/03/2017  11:34 AM To: April A Rogene Houston, CMA, #  Are you going to do the pred script or you want [place] to do it?   tx FB ----- Message ----- From: Jerene Bears, MD Sent: 08/03/2017  11:13 AM To: [name], MD, Larina Bras, CMA  Dorris Fetch, I will try and get her back in to see me sooner (Dottie, let's look for a spot) I would recommend starting pred back at 10 mg daily to see if this helps her colitis.  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  I think you are seeing her next month.  Any way to move that up?    Also, she is off prednisone since her last discharge and is having significant fatigue.  Would you recommend empirically starting that back?  tx FB

## 2017-08-05 ENCOUNTER — Other Ambulatory Visit: Payer: Self-pay | Admitting: General Surgery

## 2017-08-05 DIAGNOSIS — Z853 Personal history of malignant neoplasm of breast: Secondary | ICD-10-CM

## 2017-08-10 ENCOUNTER — Encounter: Payer: Self-pay | Admitting: *Deleted

## 2017-08-12 DIAGNOSIS — K5090 Crohn's disease, unspecified, without complications: Secondary | ICD-10-CM | POA: Diagnosis not present

## 2017-08-12 DIAGNOSIS — Z932 Ileostomy status: Secondary | ICD-10-CM | POA: Diagnosis not present

## 2017-08-13 ENCOUNTER — Ambulatory Visit: Payer: Medicare HMO | Admitting: Internal Medicine

## 2017-08-13 ENCOUNTER — Other Ambulatory Visit (INDEPENDENT_AMBULATORY_CARE_PROVIDER_SITE_OTHER): Payer: Medicare HMO

## 2017-08-13 ENCOUNTER — Encounter: Payer: Self-pay | Admitting: Internal Medicine

## 2017-08-13 VITALS — BP 110/64 | HR 84 | Ht 65.0 in | Wt 148.5 lb

## 2017-08-13 DIAGNOSIS — R1013 Epigastric pain: Secondary | ICD-10-CM | POA: Diagnosis not present

## 2017-08-13 DIAGNOSIS — G4701 Insomnia due to medical condition: Secondary | ICD-10-CM | POA: Diagnosis not present

## 2017-08-13 DIAGNOSIS — F329 Major depressive disorder, single episode, unspecified: Secondary | ICD-10-CM | POA: Diagnosis not present

## 2017-08-13 DIAGNOSIS — R110 Nausea: Secondary | ICD-10-CM

## 2017-08-13 DIAGNOSIS — E538 Deficiency of other specified B group vitamins: Secondary | ICD-10-CM

## 2017-08-13 DIAGNOSIS — Z932 Ileostomy status: Secondary | ICD-10-CM | POA: Diagnosis not present

## 2017-08-13 DIAGNOSIS — K50919 Crohn's disease, unspecified, with unspecified complications: Secondary | ICD-10-CM | POA: Diagnosis not present

## 2017-08-13 DIAGNOSIS — K50119 Crohn's disease of large intestine with unspecified complications: Secondary | ICD-10-CM

## 2017-08-13 DIAGNOSIS — F32A Depression, unspecified: Secondary | ICD-10-CM

## 2017-08-13 LAB — COMPREHENSIVE METABOLIC PANEL
ALBUMIN: 3.9 g/dL (ref 3.5–5.2)
ALK PHOS: 70 U/L (ref 39–117)
ALT: 12 U/L (ref 0–35)
AST: 16 U/L (ref 0–37)
BUN: 14 mg/dL (ref 6–23)
CHLORIDE: 103 meq/L (ref 96–112)
CO2: 23 mEq/L (ref 19–32)
CREATININE: 0.86 mg/dL (ref 0.40–1.20)
Calcium: 9.1 mg/dL (ref 8.4–10.5)
GFR: 68.22 mL/min (ref >60.00)
GLUCOSE: 89 mg/dL (ref 70–99)
POTASSIUM: 4 meq/L (ref 3.5–5.1)
Sodium: 134 mEq/L — ABNORMAL LOW (ref 135–145)
TOTAL PROTEIN: 6.8 g/dL (ref 6.0–8.3)
Total Bilirubin: 0.5 mg/dL (ref 0.2–1.2)

## 2017-08-13 LAB — CBC WITH DIFFERENTIAL/PLATELET
BASOS PCT: 0.6 % (ref 0.0–3.0)
Basophils Absolute: 0.1 10*3/uL (ref 0.0–0.1)
EOS ABS: 0.1 10*3/uL (ref 0.0–0.7)
EOS PCT: 0.5 % (ref 0.0–5.0)
HCT: 43.6 % (ref 36.0–46.0)
Hemoglobin: 14.8 g/dL (ref 12.0–15.0)
LYMPHS ABS: 1.6 10*3/uL (ref 0.7–4.0)
Lymphocytes Relative: 11.7 % — ABNORMAL LOW (ref 12.0–46.0)
MCHC: 34 g/dL (ref 30.0–36.0)
MCV: 94.3 fl (ref 78.0–100.0)
MONO ABS: 0.6 10*3/uL (ref 0.1–1.0)
Monocytes Relative: 4.4 % (ref 3.0–12.0)
NEUTROS PCT: 82.8 % — AB (ref 43.0–77.0)
Neutro Abs: 11.4 10*3/uL — ABNORMAL HIGH (ref 1.4–7.7)
Platelets: 297 10*3/uL (ref 150.0–400.0)
RBC: 4.62 Mil/uL (ref 3.87–5.11)
RDW: 14.4 % (ref 11.5–15.5)
WBC: 13.8 10*3/uL — ABNORMAL HIGH (ref 4.0–10.5)

## 2017-08-13 LAB — HIGH SENSITIVITY CRP: CRP HIGH SENSITIVITY: 1.82 mg/L (ref 0.000–5.000)

## 2017-08-13 NOTE — Patient Instructions (Signed)
Continue Prozac, Ambien, Zofran, Pepcid, B12 injections and Prednisone 10 mg.  We will work on getting Entyvio started for you.  Your provider has requested that you go to the basement level for lab work before leaving today. Press "B" on the elevator. The lab is located at the first door on the left as you exit the elevator.  You have been scheduled for follow up with Dr Hilarie Fredrickson on 10/22/17 at 245 pm.  If you are age 76 or older, your body mass index should be between 23-30. Your Body mass index is 24.71 kg/m. If this is out of the aforementioned range listed, please consider follow up with your Primary Care Provider.  If you are age 79 or younger, your body mass index should be between 19-25. Your Body mass index is 24.71 kg/m. If this is out of the aformentioned range listed, please consider follow up with your Primary Care Provider.

## 2017-08-13 NOTE — Progress Notes (Signed)
Subjective:    Patient ID: Sara Barnes, female    DOB: 11-26-1940, 77 y.o.   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  She is here today with her sister.  Unfortunately, she has been rehospitalized since being seen here last.    She had a colonoscopy converted to flexible sigmoidoscopy on 03/23/2017.  This exam was stopped due to severity of colitis in the left colon and rectum.  Several hours after this procedure while waiting to be admitted for Crohn's flare she was found to have a cecal perforation and went for right hemicolectomy.  She recovered from this and the pathology showed severely active IBD.  While recovering from this she had issues with bowel obstruction and ostomy stenosis.  Ostomy dilation was attempted but due to persistent bowel obstruction she went for resection of ileal stricture and ileostomy revision with Dr. Johney Maine on 06/02/2017.  She has been recovering slowly but had continued to have blood per rectum, abdominal discomfort with cramping, extreme fatigue, poor appetite and poor sleep.  She was seen by Dr. Barry Dienes and surgical follow-up and we decided to resume steroid therapy as she had been off prednisone for some time after tapering down.  The thought had been to start her back on biologic therapy due to her complications with perforation requiring surgery and also obstruction requiring surgery she had not been started on any other Crohn's disease therapy.  Since starting prednisone 10 mg daily she has felt considerably better.  She still having some cramping abdominal pain and some blood per rectum but this has decreased in amount and frequency.  Her fatigue has improved and her appetite has improved completely.  She is having to use Zofran but less frequently.  She is using Pepcid on an as-needed basis for dyspeptic symptoms and indigestion.   She is taking Prozac 20 mg daily but admits to taking a second dose a couple of times a month when her mood is worse.  Sleep is still not great.  She is taking her B12.  She has been having headaches recently due to pollen in the air and using Tylenol for this.  Her ostomy seems to be working well.  Her Sister Vaughan Basta changes the appliance twice weekly.  The patient states she has a difficult time looking at the ostomy and changing the appliance so her sister does this for her.  She is able to empty her bag.  Her last dose of Humira was 01/25/2017  Review of Systems As per HPI, otherwise negative  Current Medications, Allergies, Past Medical History, Past Surgical History, Family History and Social History were reviewed in [employer] record.     Objective:   Physical Exam BP 110/64 (BP Location: Right Arm, Patient Position: Sitting, Cuff Size: Normal)   Pulse 84   Ht 5' 5"  (1.651 m)   Wt 148 lb 8 oz (67.4 kg)   BMI 24.71 kg/m  Constitutional: Well-developed and well-nourished. No distress. HEENT: Normocephalic and atraumatic.   No scleral icterus. Neck: Neck supple. Trachea midline. Cardiovascular: Normal rate, regular rhythm and intact distal pulses.  Pulmonary/chest: Effort normal and breath sounds normal. No wheezing, rales or rhonchi. Abdominal: Soft, ostomy present right lower quadrant which is healthy looking, light brown stool  elluent in the ostomy bag, nontender, nondistended. Bowel sounds active throughout.  Vertical incision around the  umbilicus has healed well Extremities: no clubbing, cyanosis, or edema Neurological: Alert and oriented to person place and time. Skin: Skin is warm and dry. Psychiatric: Normal mood and affect. Behavior is normal.      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  1.  Fistulizing Crohn's colitis with  perianal involvement/end ileostomy now status post ileostomy revision due to ileostomy stricture --she is feeling better since reinitiating steroid therapy.  It is not surprising that she was having active Crohn's colitis symptoms being off of all therapy.  Unfortunately due to complications as described above we have been unable to reinitiate biologic therapy.  We spent additional time today discussing Crohn's therapy options.  At this point I think Weyman Rodney would be best for her given the relative low side effect profile and got specificity.  She has a history of breast cancer and skin cancer and also some aversion to anti-TNF therapy.  We had considered starting Cimzia but after further consideration and discussion with her I think an tibia was more appropriate.  For now we will continue prednisone 10 mg daily with the goal of hopefully weaning this off if Entyvio can be started and be effective. --CBC, CMP, CRP and quantiferon gold today  2.  Depression and anxiety --continue Prozac 20 mg daily  3.  B12 deficiency --continue monthly IM B12  4.  Insomnia --continue Ambien 10 mg at bedtime when needed  5.  Dyspepsia/nausea --can continue Pepcid 20 mg twice daily when needed.  Zofran can be used 4 mg every 6 hours when needed for nausea  Follow-up with me in June 2019  40 minutes spent with the patient today. Greater than 50% was spent in counseling and coordination of care with the patient

## 2017-08-15 LAB — QUANTIFERON-TB GOLD PLUS
Mitogen-NIL: >10 IU/mL
NIL: 0.01 [IU]/mL
QUANTIFERON-TB GOLD PLUS: NEGATIVE
TB1-NIL: 0 IU/mL
TB2-NIL: 0 IU/mL

## 2017-08-24 ENCOUNTER — Ambulatory Visit: Payer: Medicare HMO | Admitting: Internal Medicine

## 2017-08-25 NOTE — Progress Notes (Signed)
Please see below regarding coverage for the entyvio:  I called [employer] this morning. [employer] would cover 80% of charges. She would have to pay 20%. She has an out of pocket max of $3400 in which she has already met [phone]. Just her first infusion would probably go to the rest of her oop. There is no precert required for hospital or office setting. Per [facility] @ [employer] ref# [id number].   Do you want me to go ahead and schedule her?

## 2017-08-26 NOTE — Progress Notes (Signed)
Left message for pt to call back  °

## 2017-08-27 NOTE — Progress Notes (Signed)
Left message for pt yesterday and again today. Left message at Pts sister Narda Fundora's house also to see if patient may be staying with her. Left message to call back.

## 2017-08-31 ENCOUNTER — Telehealth: Payer: Self-pay | Admitting: Internal Medicine

## 2017-08-31 ENCOUNTER — Other Ambulatory Visit: Payer: Self-pay | Admitting: Internal Medicine

## 2017-08-31 NOTE — Progress Notes (Signed)
Pt states she cannot afford the Entyvio, barely making it now financially. Please advise.

## 2017-08-31 NOTE — Telephone Encounter (Signed)
Spoke with pt and she states she cannot afford the Entyvio. Pt reports she is barely keeping her head above water at this time. Please advise.

## 2017-09-01 ENCOUNTER — Ambulatory Visit: Payer: Medicare HMO | Admitting: Internal Medicine

## 2017-09-01 NOTE — Telephone Encounter (Signed)
She very much needs this therapy for her colitis Can we see if the company can offer her any assistance?

## 2017-09-01 NOTE — Telephone Encounter (Signed)
Spoke with pt and let her know we have put entyvio assistance form in the mail to her. Pt to complete the form and return it to the office asap.  Then the form can be faxed in to see if she qualifies for assistance.

## 2017-09-08 ENCOUNTER — Telehealth: Payer: Self-pay | Admitting: *Deleted

## 2017-09-08 NOTE — Telephone Encounter (Signed)
Received Physician Orders from [facility]; forwarded to provider/SLS 05/14

## 2017-09-08 NOTE — Telephone Encounter (Signed)
Faxed form for [facility] assistance for pt.

## 2017-09-10 ENCOUNTER — Telehealth: Payer: Self-pay | Admitting: Internal Medicine

## 2017-09-10 DIAGNOSIS — K5090 Crohn's disease, unspecified, without complications: Secondary | ICD-10-CM

## 2017-09-10 NOTE — Telephone Encounter (Signed)
Sara Barnes called and states that she is taking prednisone 28m daily and she has noticed a side effect. States she has noticed that the slightest little touch causes her to have a big red bruise on her hands and arms. She states it is worse than she remembers it being in the past. Please advise.

## 2017-09-10 NOTE — Telephone Encounter (Signed)
I expect prednisone is causing the easy bruising but I would recommend that she come back for a CBC with differential, PT INR and CMP

## 2017-09-10 NOTE — Telephone Encounter (Signed)
Patient states she is calling to discuss medication prednisone and wants to speak with Vaughan Basta. Pt would not go into further detail.

## 2017-09-11 ENCOUNTER — Other Ambulatory Visit (INDEPENDENT_AMBULATORY_CARE_PROVIDER_SITE_OTHER): Payer: Medicare HMO

## 2017-09-11 ENCOUNTER — Telehealth: Payer: Self-pay | Admitting: Internal Medicine

## 2017-09-11 DIAGNOSIS — K5090 Crohn's disease, unspecified, without complications: Secondary | ICD-10-CM

## 2017-09-11 LAB — COMPREHENSIVE METABOLIC PANEL
ALK PHOS: 66 U/L (ref 39–117)
ALT: 8 U/L (ref 0–35)
AST: 11 U/L (ref 0–37)
Albumin: 4.2 g/dL (ref 3.5–5.2)
BILIRUBIN TOTAL: 0.6 mg/dL (ref 0.2–1.2)
BUN: 17 mg/dL (ref 6–23)
CALCIUM: 9.5 mg/dL (ref 8.4–10.5)
CO2: 24 mEq/L (ref 19–32)
Chloride: 103 mEq/L (ref 96–112)
Creatinine, Ser: 1 mg/dL (ref 0.40–1.20)
GFR: 57.31 mL/min — AB (ref >60.00)
Glucose, Bld: 96 mg/dL (ref 70–99)
POTASSIUM: 4.1 meq/L (ref 3.5–5.1)
SODIUM: 136 meq/L (ref 135–145)
Total Protein: 7.6 g/dL (ref 6.0–8.3)

## 2017-09-11 LAB — CBC WITH DIFFERENTIAL/PLATELET
Basophils Absolute: 0.1 10*3/uL (ref 0.0–0.1)
Basophils Relative: 1 % (ref 0.0–3.0)
EOS ABS: 0.1 10*3/uL (ref 0.0–0.7)
Eosinophils Relative: 0.8 % (ref 0.0–5.0)
HCT: 46.1 % — ABNORMAL HIGH (ref 36.0–46.0)
HEMOGLOBIN: 15.8 g/dL — AB (ref 12.0–15.0)
LYMPHS PCT: 30.4 % (ref 12.0–46.0)
Lymphs Abs: 2.9 10*3/uL (ref 0.7–4.0)
MCHC: 34.3 g/dL (ref 30.0–36.0)
MCV: 95.6 fl (ref 78.0–100.0)
MONO ABS: 0.7 10*3/uL (ref 0.1–1.0)
Monocytes Relative: 7.1 % (ref 3.0–12.0)
Neutro Abs: 5.8 10*3/uL (ref 1.4–7.7)
Neutrophils Relative %: 60.7 % (ref 43.0–77.0)
Platelets: 283 10*3/uL (ref 150.0–400.0)
RBC: 4.83 Mil/uL (ref 3.87–5.11)
RDW: 14.3 % (ref 11.5–15.5)
WBC: 9.6 10*3/uL (ref 4.0–10.5)

## 2017-09-11 LAB — PROTIME-INR
INR: 0.9 ratio (ref 0.8–1.0)
PROTHROMBIN TIME: 10.9 s (ref 9.6–13.1)

## 2017-09-11 NOTE — Telephone Encounter (Signed)
Pt needs to provide benefit award letters for SS benefits and perhaps military benefits. They need to be able to calculate the annual income. Verbal statement of financial hardship given over the phone. Called pt and left message for her to call back to obtain benefit award letters.  Pt states she does not get military benefits. She will have her sister fax the [facility] letter.

## 2017-09-11 NOTE — Telephone Encounter (Signed)
Spoke with pt and she is aware, orders in epic.

## 2017-09-15 ENCOUNTER — Telehealth: Payer: Self-pay | Admitting: *Deleted

## 2017-09-15 ENCOUNTER — Other Ambulatory Visit: Payer: Self-pay | Admitting: Surgery

## 2017-09-15 DIAGNOSIS — Z932 Ileostomy status: Secondary | ICD-10-CM

## 2017-09-15 NOTE — Telephone Encounter (Signed)
Received Physician Orders from [facility]; forwarded to provider/SLS 05/21

## 2017-09-16 ENCOUNTER — Telehealth: Payer: Self-pay | Admitting: Internal Medicine

## 2017-09-17 NOTE — Telephone Encounter (Signed)
Pt has CT scan Friday for possible hernia at Stoma site. Will wait on results prior to scheduling Entyvio. Drug will be shipped 09/23/17.

## 2017-09-17 NOTE — Telephone Encounter (Signed)
Great news, please start with standard induction ASAP

## 2017-09-17 NOTE — Telephone Encounter (Signed)
Pt approved for free entyvio. Waiting on pharmacy to call back to set up shipment. The pt will have to pay the infusion cost.

## 2017-09-18 ENCOUNTER — Other Ambulatory Visit (HOSPITAL_COMMUNITY): Payer: Self-pay | Admitting: General Practice

## 2017-09-18 ENCOUNTER — Ambulatory Visit
Admission: RE | Admit: 2017-09-18 | Discharge: 2017-09-18 | Disposition: A | Discharge status: Home / Self Care | Payer: Medicare HMO | Source: Ambulatory Visit | Attending: Surgery | Admitting: Surgery

## 2017-09-18 DIAGNOSIS — Z932 Ileostomy status: Secondary | ICD-10-CM

## 2017-09-18 DIAGNOSIS — R1900 Intra-abdominal and pelvic swelling, mass and lump, unspecified site: Secondary | ICD-10-CM | POA: Diagnosis not present

## 2017-09-18 MED ORDER — IOPAMIDOL (ISOVUE-300) INJECTION 61%
100.0000 mL | Freq: Once | INTRAVENOUS | Status: AC | PRN
  Administered 2017-09-18: 100 mL via INTRAVENOUS

## 2017-09-22 ENCOUNTER — Other Ambulatory Visit: Payer: Self-pay | Admitting: Internal Medicine

## 2017-09-22 DIAGNOSIS — K51911 Ulcerative colitis, unspecified with rectal bleeding: Secondary | ICD-10-CM

## 2017-09-22 DIAGNOSIS — E538 Deficiency of other specified B group vitamins: Secondary | ICD-10-CM

## 2017-09-22 DIAGNOSIS — K6289 Other specified diseases of anus and rectum: Secondary | ICD-10-CM

## 2017-09-22 NOTE — Telephone Encounter (Signed)
Spoke with pt and she is aware. Pt will call [facility] to get an appt if she does not hear from their office by the end of the day.

## 2017-09-22 NOTE — Telephone Encounter (Signed)
Pt states she had CT scan done Friday but had not heard back from [facility] regarding results. Pt reports her stoma is so swollen and painful, states it looks like a volcano that is ready to erupt, also reports her back has started to hurt. Let pt know that she has been approved for free Entyvio and we wanted to get her scheduled for infusions. Pt wants to make sure everything is ok with stoma before infusions. Pt has not seen Dr. Johney Maine they just ordered the CT first. Please advise.

## 2017-09-22 NOTE — Telephone Encounter (Signed)
CT scan did not show any overt problems, obstruction etc. However if she is having symptoms and pain at ostomy she needs to be seen again by general surgery I will alert Dr. Johney Maine to this note and her contact with [place] [facility] on [facility] for now

## 2017-09-23 ENCOUNTER — Ambulatory Visit
Admission: RE | Admit: 2017-09-23 | Discharge: 2017-09-23 | Disposition: A | Discharge status: Home / Self Care | Payer: Medicare HMO | Source: Ambulatory Visit | Attending: General Surgery | Admitting: General Surgery

## 2017-09-23 ENCOUNTER — Other Ambulatory Visit: Payer: Self-pay | Admitting: General Surgery

## 2017-09-23 ENCOUNTER — Other Ambulatory Visit: Payer: Self-pay | Admitting: Family Medicine

## 2017-09-23 ENCOUNTER — Telehealth: Payer: Self-pay | Admitting: Internal Medicine

## 2017-09-23 DIAGNOSIS — Z853 Personal history of malignant neoplasm of breast: Secondary | ICD-10-CM

## 2017-09-23 DIAGNOSIS — R928 Other abnormal and inconclusive findings on diagnostic imaging of breast: Secondary | ICD-10-CM | POA: Diagnosis not present

## 2017-09-23 DIAGNOSIS — N6489 Other specified disorders of breast: Secondary | ICD-10-CM | POA: Diagnosis not present

## 2017-09-23 NOTE — Telephone Encounter (Signed)
Left message for Sara Barnes to call back.

## 2017-09-23 NOTE — Telephone Encounter (Signed)
[facility] pharmacy has received the first 2 doses of Entyvio for pt.

## 2017-09-23 NOTE — Telephone Encounter (Signed)
Pharmacy returned your call regarding this pt.

## 2017-09-24 ENCOUNTER — Ambulatory Visit
Admission: RE | Admit: 2017-09-24 | Discharge: 2017-09-24 | Disposition: A | Discharge status: Home / Self Care | Payer: Medicare HMO | Source: Ambulatory Visit | Attending: General Surgery | Admitting: General Surgery

## 2017-09-24 ENCOUNTER — Other Ambulatory Visit: Payer: Self-pay | Admitting: Diagnostic Radiology

## 2017-09-24 ENCOUNTER — Other Ambulatory Visit: Payer: Self-pay | Admitting: General Surgery

## 2017-09-24 DIAGNOSIS — N6012 Diffuse cystic mastopathy of left breast: Secondary | ICD-10-CM | POA: Diagnosis not present

## 2017-09-24 DIAGNOSIS — Z853 Personal history of malignant neoplasm of breast: Secondary | ICD-10-CM

## 2017-09-24 DIAGNOSIS — N6322 Unspecified lump in the left breast, upper inner quadrant: Secondary | ICD-10-CM | POA: Diagnosis not present

## 2017-09-25 ENCOUNTER — Telehealth: Payer: Self-pay | Admitting: *Deleted

## 2017-09-25 ENCOUNTER — Other Ambulatory Visit: Payer: Self-pay

## 2017-09-25 DIAGNOSIS — K5090 Crohn's disease, unspecified, without complications: Secondary | ICD-10-CM

## 2017-09-25 DIAGNOSIS — Z932 Ileostomy status: Secondary | ICD-10-CM | POA: Diagnosis not present

## 2017-09-25 NOTE — Telephone Encounter (Signed)
Received Physician Orders from [place]; forwarded to provider/SLS 05/31

## 2017-09-28 ENCOUNTER — Other Ambulatory Visit: Payer: Self-pay | Admitting: Internal Medicine

## 2017-09-28 DIAGNOSIS — K50113 Crohn's disease of large intestine with fistula: Secondary | ICD-10-CM

## 2017-09-30 ENCOUNTER — Telehealth: Payer: Self-pay | Admitting: *Deleted

## 2017-09-30 NOTE — Telephone Encounter (Signed)
Received Dermatopathology Report results from [facility]; forwarded to provider/SLS 06/05

## 2017-10-01 ENCOUNTER — Encounter (HOSPITAL_COMMUNITY)
Admission: RE | Admit: 2017-10-01 | Discharge: 2017-10-01 | Disposition: A | Discharge status: Home / Self Care | Payer: Medicare HMO | Source: Ambulatory Visit | Attending: Internal Medicine | Admitting: Internal Medicine

## 2017-10-01 ENCOUNTER — Encounter (HOSPITAL_COMMUNITY): Payer: Self-pay

## 2017-10-01 ENCOUNTER — Other Ambulatory Visit: Payer: Self-pay

## 2017-10-01 DIAGNOSIS — K5090 Crohn's disease, unspecified, without complications: Secondary | ICD-10-CM | POA: Diagnosis not present

## 2017-10-01 MED ORDER — SODIUM CHLORIDE 0.9 % IV SOLN
INTRAVENOUS | Status: AC
  Administered 2017-10-01: 13:00:00 via INTRAVENOUS

## 2017-10-01 MED ORDER — VEDOLIZUMAB 300 MG IV SOLR
300.0000 mg | Freq: Once | INTRAVENOUS | Status: AC
  Administered 2017-10-01: 300 mg via INTRAVENOUS
  Filled 2017-10-01: qty 5

## 2017-10-01 NOTE — Discharge Instructions (Signed)
Call Dr. Hilarie Fredrickson with problems or questions  [phone] Call 911 for emergencies     Entyvio Vedolizumab injection solution What is this medicine? VEDOLIZUMAB (Ve doe LIZ you mab) is used to treat ulcerative colitis and Crohn's disease in adult patients. This medicine may be used for other purposes; ask your health care provider or pharmacist if you have questions. COMMON BRAND NAME(S): Entyvio What should I tell my health care provider before I take this medicine? They need to know if you have any of these conditions: -diabetes -hepatitis B or history of hepatitis B infection -HIV or AIDS -immune system problems -infection or history of infections -liver disease -recently received or scheduled to receive a vaccine -scheduled to have surgery -tuberculosis, a positive skin test for tuberculosis or have recently been in close contact with someone who has tuberculosis - an unusual or allergic reaction to vedolizumab, other medicines, foods, dyes, or preservatives -pregnant or trying to get pregnant -breast-feeding How should I use this medicine? This medicine is for infusion into a vein. It is given by a health care professional in a hospital or clinic setting. A special MedGuide will be given to you by the pharmacist with each prescription and refill. Be sure to read this information carefully each time. Talk to your pediatrician regarding the use of this medicine in children. This medicine is not approved for use in children. Overdosage: If you think you have taken too much of this medicine contact a poison control center or emergency room at once. NOTE: This medicine is only for you. Do not share this medicine with others. What if I miss a dose? It is important not to miss your dose. Call your doctor or health care professional if you are unable to keep an appointment. What may interact with this medicine? -steroid medicines like prednisone or cortisone -TNF-alpha inhibitors  like natalizumab, adalimumab, and infliximab -vaccines This list may not describe all possible interactions. Give your health care provider a list of all the medicines, herbs, non-prescription drugs, or dietary supplements you use. Also tell them if you smoke, drink alcohol, or use illegal drugs. Some items may interact with your medicine. What should I watch for while using this medicine? Your condition will be monitored carefully while you are receiving this medicine. Visit your doctor for regular check ups. Tell your doctor or healthcare professional if your symptoms do not start to get better or if they get worse. Stay away from people who are sick. Call your doctor or health care professional for advice if you get a fever, chills or sore throat, or other symptoms of a cold or flu. Do not treat yourself. In some patients, this medicine may cause a serious brain infection that may cause death. If you have any problems seeing, thinking, speaking, walking, or standing, tell your doctor right away. If you cannot reach your doctor, get urgent medical care. What side effects may I notice from receiving this medicine? Side effects that you should report to your doctor or health care professional as soon as possible: -allergic reactions like skin rash, itching or hives, swelling of the face, lips, or tongue -breathing problems -changes in vision -chest pain -dark urine -depression, feelings of sadness -dizziness -general ill feeling or flu-like symptoms -irregular, missed, or painful menstrual periods -light-colored stools -loss of appetite, nausea -muscle weakness -problems with balance, talking, or walking -right upper belly pain -unusually weak or tired -yellowing of the eyes or skin Side effects that usually do not require medical  attention (report to your doctor or health care professional if they continue or are bothersome): -aches, pains -headache -stomach upset -tiredness This list  may not describe all possible side effects. Call your doctor for medical advice about side effects. You may report side effects to FDA at 1-800-FDA-1088. Where should I keep my medicine? This drug is given in a hospital or clinic and will not be stored at home. NOTE: This sheet is a summary. It may not cover all possible information. If you have questions about this medicine, talk to your doctor, pharmacist, or health care provider.  2018 Elsevier/Gold Standard (2015-05-17 08:36:51)

## 2017-10-01 NOTE — Progress Notes (Signed)
Patient received first dose of Entyvio today. Tolerated well. Aware to call MD for any problems or questions. Has future appointments for Entyvio. D/c to home with sister.

## 2017-10-06 DIAGNOSIS — K5090 Crohn's disease, unspecified, without complications: Secondary | ICD-10-CM | POA: Diagnosis not present

## 2017-10-06 DIAGNOSIS — Z932 Ileostomy status: Secondary | ICD-10-CM | POA: Diagnosis not present

## 2017-10-15 ENCOUNTER — Other Ambulatory Visit: Payer: Self-pay

## 2017-10-15 ENCOUNTER — Encounter (HOSPITAL_COMMUNITY): Payer: Self-pay

## 2017-10-15 ENCOUNTER — Encounter (HOSPITAL_COMMUNITY)
Admission: RE | Admit: 2017-10-15 | Discharge: 2017-10-15 | Disposition: A | Discharge status: Home / Self Care | Payer: Medicare HMO | Source: Ambulatory Visit | Attending: Internal Medicine | Admitting: Internal Medicine

## 2017-10-15 DIAGNOSIS — K5090 Crohn's disease, unspecified, without complications: Secondary | ICD-10-CM

## 2017-10-15 DIAGNOSIS — K50918 Crohn's disease, unspecified, with other complication: Secondary | ICD-10-CM

## 2017-10-15 MED ORDER — VEDOLIZUMAB 300 MG IV SOLR
300.0000 mg | Freq: Once | INTRAVENOUS | Status: AC
  Administered 2017-10-15: 300 mg via INTRAVENOUS
  Filled 2017-10-15: qty 5

## 2017-10-15 MED ORDER — SODIUM CHLORIDE 0.9 % IV SOLN
Freq: Once | INTRAVENOUS | Status: AC
  Administered 2017-10-15: 13:00:00 via INTRAVENOUS

## 2017-10-22 ENCOUNTER — Ambulatory Visit: Payer: Medicare HMO | Admitting: Internal Medicine

## 2017-10-22 ENCOUNTER — Encounter: Payer: Self-pay | Admitting: Internal Medicine

## 2017-10-22 VITALS — BP 118/70 | HR 72 | Ht 66.0 in | Wt 156.0 lb

## 2017-10-22 DIAGNOSIS — R110 Nausea: Secondary | ICD-10-CM

## 2017-10-22 DIAGNOSIS — Z932 Ileostomy status: Secondary | ICD-10-CM | POA: Diagnosis not present

## 2017-10-22 DIAGNOSIS — K50113 Crohn's disease of large intestine with fistula: Secondary | ICD-10-CM | POA: Diagnosis not present

## 2017-10-22 DIAGNOSIS — R1013 Epigastric pain: Secondary | ICD-10-CM

## 2017-10-22 DIAGNOSIS — G4701 Insomnia due to medical condition: Secondary | ICD-10-CM | POA: Diagnosis not present

## 2017-10-22 MED ORDER — ONDANSETRON 4 MG PO TBDP: 4.0000 mg | ORAL_TABLET | Freq: Four times a day (QID) | ORAL | 1 refills | Status: DC | PRN

## 2017-10-22 MED ORDER — ZOLPIDEM TARTRATE 10 MG PO TABS: ORAL_TABLET | ORAL | 2 refills | Status: DC

## 2017-10-22 MED ORDER — PREDNISONE 5 MG PO TABS: 5.0000 mg | ORAL_TABLET | Freq: Every day | ORAL | 0 refills | Status: DC

## 2017-10-22 NOTE — Patient Instructions (Addendum)
Please decrease your Prednisone to 5 mg daily.  Continue Entyvio.  We have sent the following medications to your pharmacy for you to pick up at your convenience: Ambien Zofran as needed  Please follow up with Dr Hilarie Fredrickson on 01/01/18 at 245 pm.  If you are age 76 or older, your body mass index should be between 23-30. Your Body mass index is 25.18 kg/m. If this is out of the aforementioned range listed, please consider follow up with your Primary Care Provider.  If you are age 60 or younger, your body mass index should be between 19-25. Your Body mass index is 25.18 kg/m. If this is out of the aformentioned range listed, please consider follow up with your Primary Care Provider.

## 2017-10-22 NOTE — Progress Notes (Signed)
Subjective:    Patient ID: Sara Barnes, female    DOB: [birth year], 77 y.o.   MRN: [id number]  HPI Sara Barnes is a 77 year old female with a history of Crohn's colitis complicated by perianal fistulas, history of end ileostomy, history of GERD/dyspepsia, and insomnia who is here for follow-up.  She is here today with her sister.  She has been started on Entyvio since last visit and has had 2 infusions.  The third and final induction infusion is scheduled for 11/12/2017.  She tolerated these infusions well.  She continues prednisone at 10 mg daily.  She states that she has had definitive decrease in her rectal pain, rectal bleeding and rectal drainage.  Still some minor perianal discomfort from time to time.  Still very much dislikes her ostomy and states it is hard to be active, gardening and bend over without feeling pressure near the ostomy.  She calls it a "contraption" and "like a Volcano".  She has had some mild dizziness but no falls.  Some increased sinus and ear pressure.  She would like to try Flonase.  Mild nausea less than 1 day/week for which she will take Zofran with success.  She is using famotidine on an as-needed basis.  She feels like she is "hooked on Ambien".  She is taking it nightly and without Ambien she does not sleep.  She is been to the beach recently with family.  She has a grandson who will be attending [employer] in the fall and recently graduated from high school.  She is planning to go with her sister to [place] later this summer.  Review of Systems As per HPI, otherwise negative  Current Medications, Allergies, Past Medical History, Past Surgical History, Family History and Social History were reviewed in [employer] record.     Objective:   Physical Exam BP 118/70   Pulse 72   Ht 5' 6"  (1.676 m)   Wt 156 lb (70.8 kg)   BMI 25.18 kg/m  Constitutional: Well-developed and well-nourished. No distress. HEENT:  Normocephalic and atraumatic.  Conjunctivae are normal.  No scleral icterus. Neck: Neck supple. Trachea midline. Cardiovascular: Normal rate, regular rhythm and intact distal pulses. No M/R/G Pulmonary/chest: Effort normal and breath sounds normal. No wheezing, rales or rhonchi. Abdominal: Soft, nontender, nondistended. Bowel sounds active throughout. There are no masses palpable.  Ostomy right middle abdomen normal appearing and nontender Extremities: no clubbing, cyanosis, or edema Neurological: Alert and oriented to person place and time. Skin: Skin is warm and dry. Psychiatric: Normal mood and affect. Behavior is normal.  CBC    Component Value Date/Time   WBC 9.6 09/11/2017 1103   RBC 4.83 09/11/2017 1103   HGB 15.8 (H) 09/11/2017 1103   HGB 13.7 12/06/2014 0804   HCT 46.1 (H) 09/11/2017 1103   HCT 41.1 12/06/2014 0804   PLT 283.0 09/11/2017 1103   PLT 303 12/06/2014 0804   MCV 95.6 09/11/2017 1103   MCV 95.8 12/06/2014 0804   MCH 32.9 06/09/2017 0348   MCHC 34.3 09/11/2017 1103   RDW 14.3 09/11/2017 1103   RDW 12.2 12/06/2014 0804   LYMPHSABS 2.9 09/11/2017 1103   LYMPHSABS 1.8 12/06/2014 0804   MONOABS 0.7 09/11/2017 1103   MONOABS 0.8 12/06/2014 0804   EOSABS 0.1 09/11/2017 1103   EOSABS 0.2 12/06/2014 0804   BASOSABS 0.1 09/11/2017 1103   BASOSABS 0.0 12/06/2014 0804   CMP     Component Value Date/Time  NA 136 09/11/2017 1103   NA 138 12/06/2014 0804   K 4.1 09/11/2017 1103   K 4.1 12/06/2014 0804   CL 103 09/11/2017 1103   CO2 24 09/11/2017 1103   CO2 25 12/06/2014 0804   GLUCOSE 96 09/11/2017 1103   GLUCOSE 92 12/06/2014 0804   BUN 17 09/11/2017 1103   BUN 12.3 12/06/2014 0804   CREATININE 1.00 09/11/2017 1103   CREATININE 0.8 12/06/2014 0804   CALCIUM 9.5 09/11/2017 1103   CALCIUM 9.4 12/06/2014 0804   PROT 7.6 09/11/2017 1103   PROT 6.5 12/06/2014 0804   ALBUMIN 4.2 09/11/2017 1103   ALBUMIN 3.3 (L) 12/06/2014 0804   AST 11 09/11/2017 1103    AST 9 12/06/2014 0804   ALT 8 09/11/2017 1103   ALT 10 12/06/2014 0804   ALKPHOS 66 09/11/2017 1103   ALKPHOS 89 12/06/2014 0804   BILITOT 0.6 09/11/2017 1103   BILITOT 0.37 12/06/2014 0804   GFRNONAA >60 06/09/2017 0348   GFRAA >60 06/09/2017 0348       Assessment & Plan:  77 year old female with a history of Crohn's colitis complicated by perianal fistulas, history of end ileostomy, history of GERD/dyspepsia, and insomnia who is here for follow-up.   1.  Crohn's colitis with perianal involvement/history of fistula/end ileostomy --she is doing better medically and this is likely secondary to both Entyvio and prednisone.  We will continue Entyvio which will be every 8 weeks after completing third induction infusion in a few weeks.  I am going to decrease prednisone to 5 mg daily and then eventually plan to taper this very slowly to off as tolerated.  She remains very, very interested in having her ostomy reversed.  We discussed how her  colitis would need to be in remission on Entyvio before we could consider having her ostomy reversed.  I think this is possible depending on how she does with Entyvio.  2.  Nausea/dyspepsia --continue Pepcid twice daily as needed.  Zofran can be used 4 mg every 6 hours when needed for nausea  3.  Insomnia --continue Ambien 10 mg at bedtime as needed  4.  B12 deficiency --continue monthly IM B12  Follow-up in 3 months, sooner if needed

## 2017-10-29 IMAGING — CT CT ABD-PELV W/ CM
2 of 6 series · 16 of 46 positions shown, 18 images · IV contrast (iopamidol)
Comparison: 01/01/2016 CT of abdomen and pelvis.

CLINICAL DATA: 75 y/o F; history of inflammatory bowel disease,
believed to be ulcerative colitis, presenting with severe abdominal
pain for several days.

EXAM:
CT ABDOMEN AND PELVIS WITH CONTRAST
TECHNIQUE: Multidetector CT imaging of the abdomen and pelvis was performed
using the standard protocol following bolus administration of
intravenous contrast.
CONTRAST:  100mL Y6MJQR-DCC IOPAMIDOL (Y6MJQR-DCC) INJECTION 61%

[Series 2: rtn a/p with · axial · 0.72mm/px · z∈[-506,-112]mm · 13 of 93 slices shown, 15 images]
[im 7/93  soft-tissue]
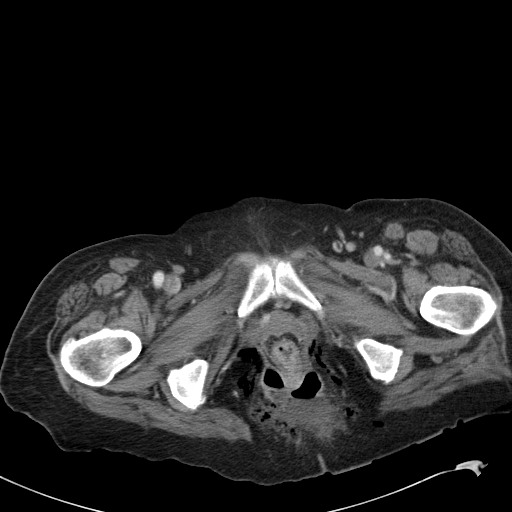
[im 7/93  bone]
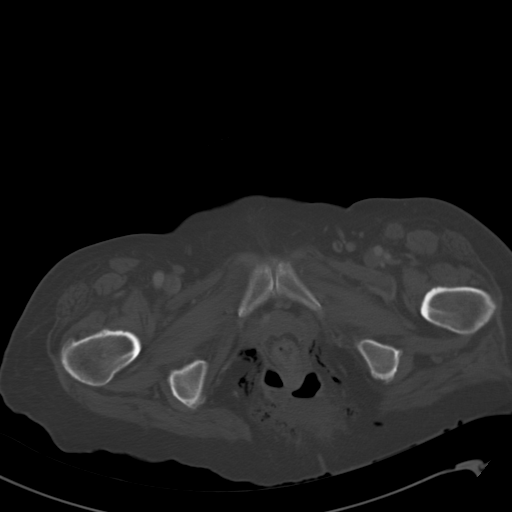
[im 14/93  soft-tissue]
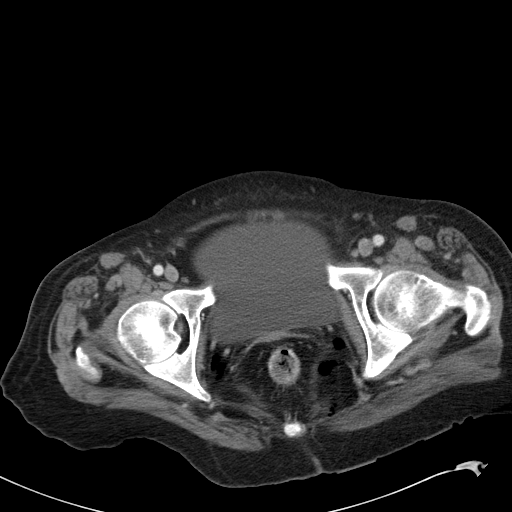
[im 20/93  soft-tissue]
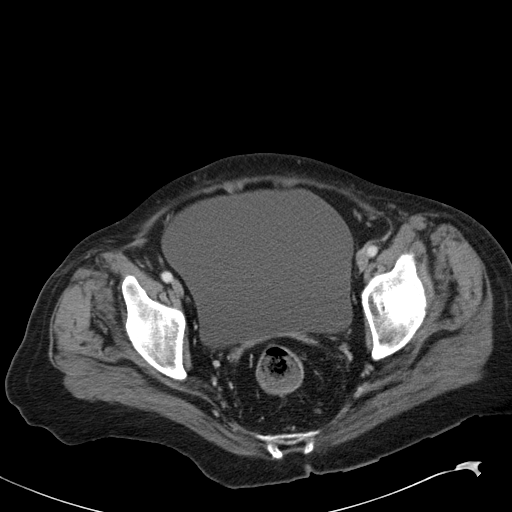
[im 27/93  soft-tissue]
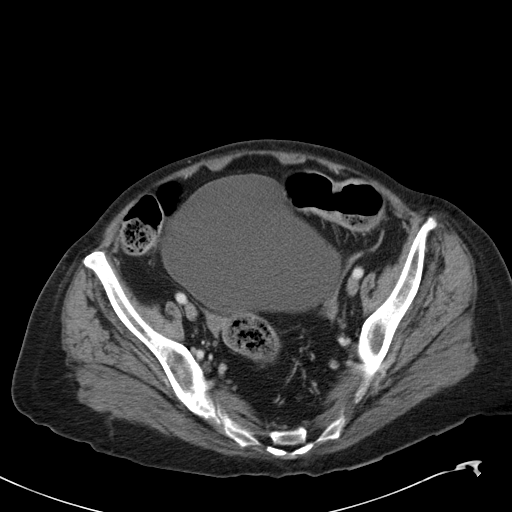
[im 33/93  soft-tissue]
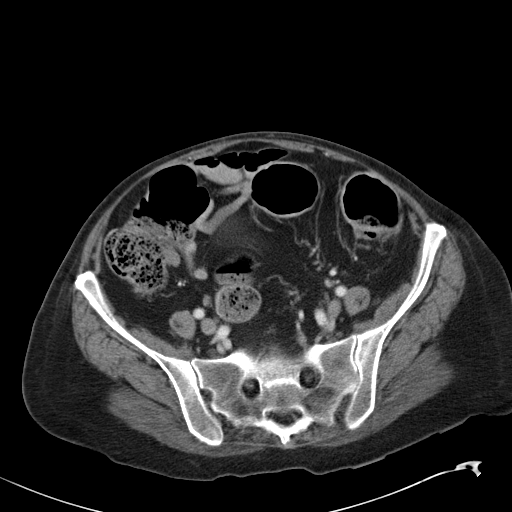
[im 40/93  soft-tissue]
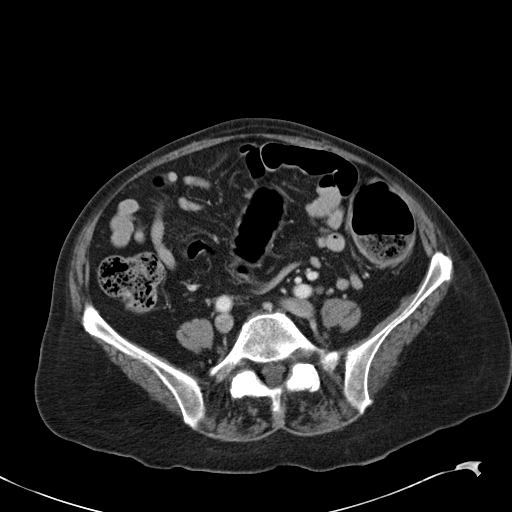
[im 47/93  soft-tissue]
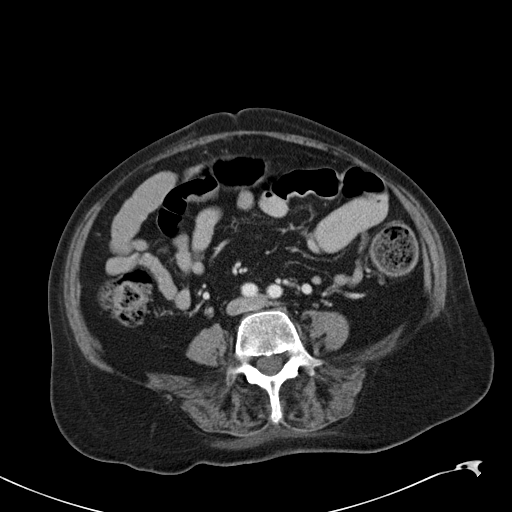
[im 53/93  soft-tissue]
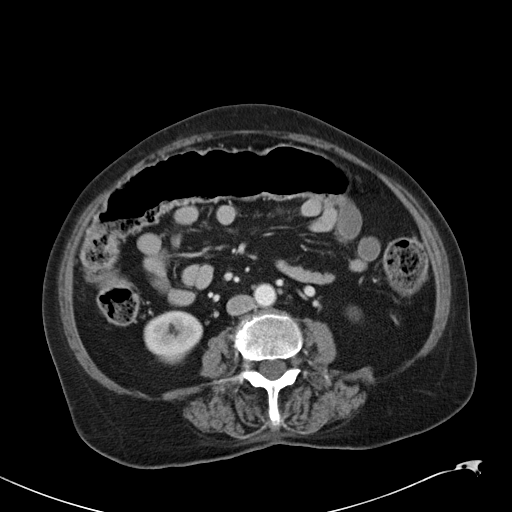
[im 60/93  soft-tissue]
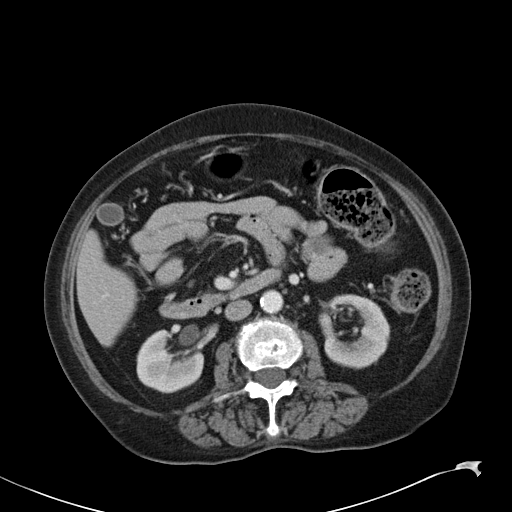
[im 60/93  bone]
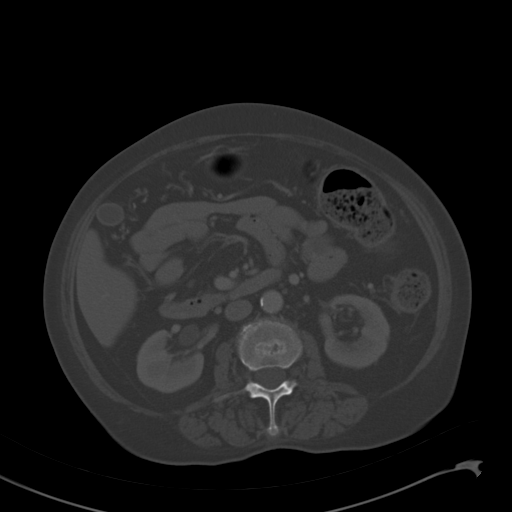
[im 66/93  soft-tissue]
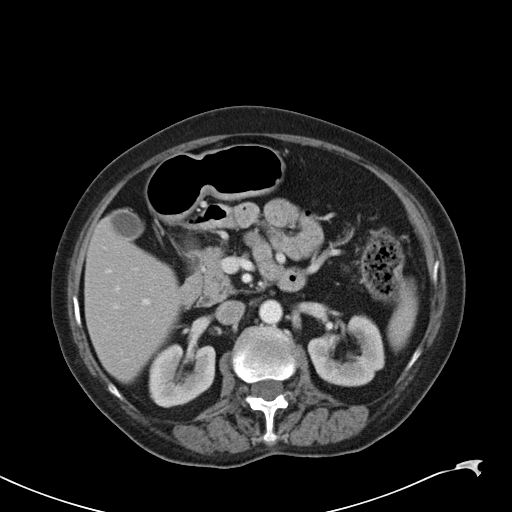
[im 73/93  soft-tissue]
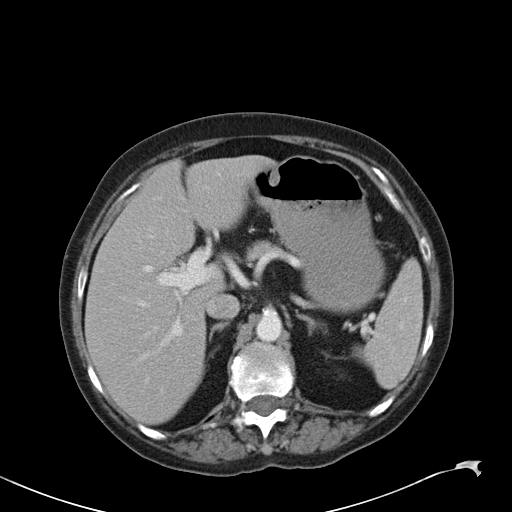
[im 79/93  soft-tissue]
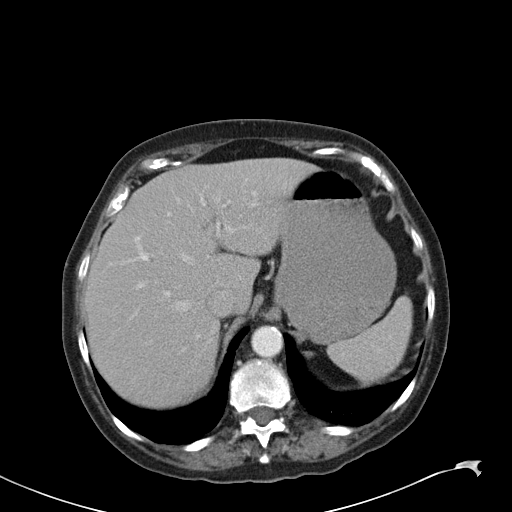
[im 86/93  soft-tissue]
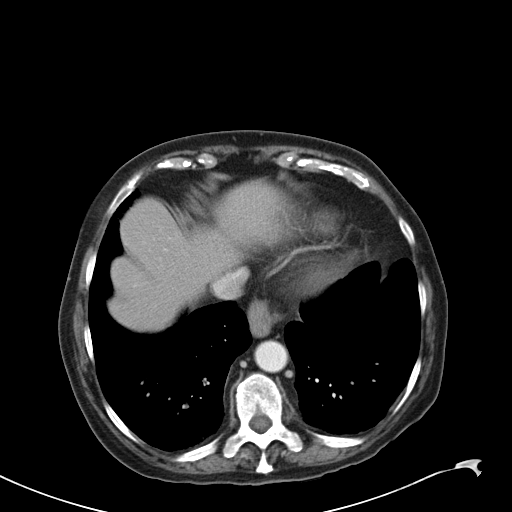

[Series 602: cor · coronal · 0.90mm/px · 3 of 133 slices shown]
[im 45/133  soft-tissue]
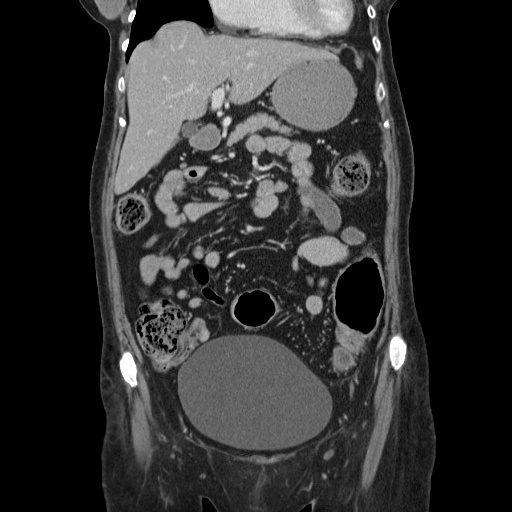
[im 59/133  soft-tissue]
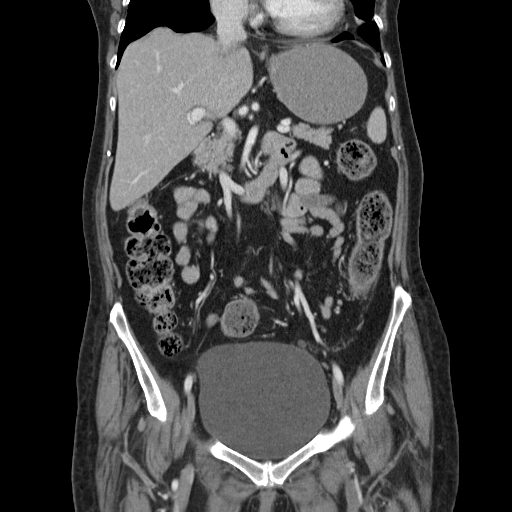
[im 74/133  soft-tissue]
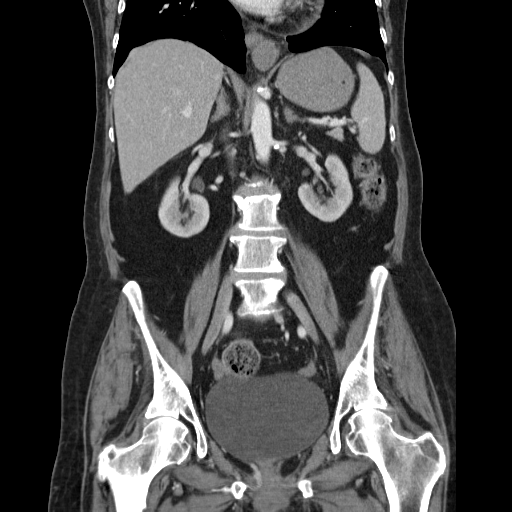

[16 of 46 positions shown; findings below may reference images not displayed]

FINDINGS: Lower chest: Bilateral breast implants. Normal heart size. No
pericardial effusion. Clear lung bases. Interval resolution of
pleural effusion.

Hepatobiliary: No focal liver abnormality is seen. No gallstones,
gallbladder wall thickening, or biliary dilatation.

Pancreas: Unremarkable. No pancreatic ductal dilatation or
surrounding inflammatory changes.

Spleen: Normal in size without focal abnormality.

Adrenals/Urinary Tract: Adrenal glands are unremarkable. Kidneys are
normal, without renal calculi, focal lesion, or hydronephrosis.
Severely distended bladder.

Stomach/Bowel: Small hiatal hernia. No evidence for bowel
obstruction. Further interval improvement inflammatory changes of
the colon with mild residual mid descending colon. Small bowel is
unremarkable.

There is a large a U shaped perianal abscess measuring 64 x 46 mm
axially extending from the 3 to 9 o'clock containing air and fluid.
There is a 6 o'clock fistula (series 3, image 6 and series 603,
image 84). Additionally there is fat stranding and inflammatory
changes throughout the age she anal fat extending into left lower
gluteal fold and left labia majora with air.

Vascular/Lymphatic: Aortic atherosclerosis. No enlarged abdominal or
pelvic lymph nodes.

Reproductive: Status post hysterectomy. No adnexal masses.

Other: Choose

Musculoskeletal: No acute or significant osseous findings.
IMPRESSION: 1. Growing perianal abscess and fistula. Inflammatory changes extend
throughout bilateral ischioanal fat, the left lower gluteal fold,
and into the left labia majora. There are multiple foci of air
concerning for gas-forming organism.
2. Further improvement in inflammatory changes of the colon with
mild residual in the descending colon.
These results will be called to the ordering clinician or
representative by the Radiologist Assistant, and communication
documented in the PACS or zVision Dashboard.

By: Shomari Ling M.D.

## 2017-11-12 ENCOUNTER — Encounter (HOSPITAL_COMMUNITY): Payer: Self-pay

## 2017-11-12 ENCOUNTER — Encounter (HOSPITAL_COMMUNITY)
Admission: RE | Admit: 2017-11-12 | Discharge: 2017-11-12 | Disposition: A | Discharge status: Home / Self Care | Payer: Medicare HMO | Source: Ambulatory Visit | Attending: Internal Medicine | Admitting: Internal Medicine

## 2017-11-12 DIAGNOSIS — K50918 Crohn's disease, unspecified, with other complication: Secondary | ICD-10-CM

## 2017-11-12 DIAGNOSIS — K5090 Crohn's disease, unspecified, without complications: Secondary | ICD-10-CM | POA: Diagnosis not present

## 2017-11-12 MED ORDER — VEDOLIZUMAB 300 MG IV SOLR
300.0000 mg | Freq: Once | INTRAVENOUS | Status: AC
  Administered 2017-11-12: 300 mg via INTRAVENOUS
  Filled 2017-11-12: qty 5

## 2017-11-12 MED ORDER — SODIUM CHLORIDE 0.9 % IV SOLN
Freq: Once | INTRAVENOUS | Status: AC
  Administered 2017-11-12: 12:00:00 via INTRAVENOUS

## 2017-12-10 ENCOUNTER — Other Ambulatory Visit: Payer: Self-pay | Admitting: Internal Medicine

## 2017-12-15 DIAGNOSIS — H9201 Otalgia, right ear: Secondary | ICD-10-CM | POA: Diagnosis not present

## 2017-12-15 DIAGNOSIS — H903 Sensorineural hearing loss, bilateral: Secondary | ICD-10-CM | POA: Diagnosis not present

## 2017-12-15 DIAGNOSIS — H8093 Unspecified otosclerosis, bilateral: Secondary | ICD-10-CM | POA: Diagnosis not present

## 2017-12-15 DIAGNOSIS — R42 Dizziness and giddiness: Secondary | ICD-10-CM | POA: Diagnosis not present

## 2017-12-18 ENCOUNTER — Other Ambulatory Visit: Payer: Self-pay | Admitting: Internal Medicine

## 2017-12-18 DIAGNOSIS — K51911 Ulcerative colitis, unspecified with rectal bleeding: Secondary | ICD-10-CM

## 2017-12-18 DIAGNOSIS — K6289 Other specified diseases of anus and rectum: Secondary | ICD-10-CM

## 2017-12-18 DIAGNOSIS — Z932 Ileostomy status: Secondary | ICD-10-CM | POA: Diagnosis not present

## 2017-12-18 DIAGNOSIS — E538 Deficiency of other specified B group vitamins: Secondary | ICD-10-CM

## 2017-12-18 DIAGNOSIS — K5090 Crohn's disease, unspecified, without complications: Secondary | ICD-10-CM | POA: Diagnosis not present

## 2017-12-31 ENCOUNTER — Other Ambulatory Visit (HOSPITAL_COMMUNITY): Payer: Self-pay | Admitting: General Practice

## 2017-12-31 ENCOUNTER — Other Ambulatory Visit: Payer: Self-pay

## 2017-12-31 DIAGNOSIS — K5090 Crohn's disease, unspecified, without complications: Secondary | ICD-10-CM

## 2018-01-01 ENCOUNTER — Other Ambulatory Visit (HOSPITAL_COMMUNITY): Payer: Self-pay | Admitting: General Practice

## 2018-01-01 ENCOUNTER — Other Ambulatory Visit (INDEPENDENT_AMBULATORY_CARE_PROVIDER_SITE_OTHER): Payer: Medicare HMO

## 2018-01-01 ENCOUNTER — Encounter: Payer: Self-pay | Admitting: Internal Medicine

## 2018-01-01 ENCOUNTER — Ambulatory Visit: Payer: Medicare HMO | Admitting: Internal Medicine

## 2018-01-01 VITALS — BP 114/72 | HR 87 | Ht 66.0 in | Wt 157.0 lb

## 2018-01-01 DIAGNOSIS — R110 Nausea: Secondary | ICD-10-CM | POA: Diagnosis not present

## 2018-01-01 DIAGNOSIS — E538 Deficiency of other specified B group vitamins: Secondary | ICD-10-CM | POA: Diagnosis not present

## 2018-01-01 DIAGNOSIS — K50918 Crohn's disease, unspecified, with other complication: Secondary | ICD-10-CM | POA: Diagnosis not present

## 2018-01-01 DIAGNOSIS — K9419 Other complications of enterostomy: Secondary | ICD-10-CM | POA: Diagnosis not present

## 2018-01-01 DIAGNOSIS — G4701 Insomnia due to medical condition: Secondary | ICD-10-CM | POA: Diagnosis not present

## 2018-01-01 DIAGNOSIS — K50113 Crohn's disease of large intestine with fistula: Secondary | ICD-10-CM

## 2018-01-01 DIAGNOSIS — K469 Unspecified abdominal hernia without obstruction or gangrene: Secondary | ICD-10-CM | POA: Diagnosis not present

## 2018-01-01 DIAGNOSIS — R5383 Other fatigue: Secondary | ICD-10-CM | POA: Diagnosis not present

## 2018-01-01 LAB — COMPREHENSIVE METABOLIC PANEL
ALBUMIN: 4.2 g/dL (ref 3.5–5.2)
ALT: 11 U/L (ref 0–35)
AST: 17 U/L (ref 0–37)
Alkaline Phosphatase: 74 U/L (ref 39–117)
BUN: 15 mg/dL (ref 6–23)
CALCIUM: 9.5 mg/dL (ref 8.4–10.5)
CHLORIDE: 102 meq/L (ref 96–112)
CO2: 22 meq/L (ref 19–32)
Creatinine, Ser: 0.94 mg/dL (ref 0.40–1.20)
GFR: 61.51 mL/min (ref >60.00)
Glucose, Bld: 120 mg/dL — ABNORMAL HIGH (ref 70–99)
Potassium: 4.1 mEq/L (ref 3.5–5.1)
Sodium: 133 mEq/L — ABNORMAL LOW (ref 135–145)
Total Bilirubin: 0.6 mg/dL (ref 0.2–1.2)
Total Protein: 7.4 g/dL (ref 6.0–8.3)

## 2018-01-01 LAB — CBC WITH DIFFERENTIAL/PLATELET
BASOS PCT: 0.5 % (ref 0.0–3.0)
Basophils Absolute: 0.1 10*3/uL (ref 0.0–0.1)
EOS PCT: 0.1 % (ref 0.0–5.0)
Eosinophils Absolute: 0 10*3/uL (ref 0.0–0.7)
HEMATOCRIT: 42.5 % (ref 36.0–46.0)
HEMOGLOBIN: 14.6 g/dL (ref 12.0–15.0)
LYMPHS PCT: 21 % (ref 12.0–46.0)
Lymphs Abs: 2.5 10*3/uL (ref 0.7–4.0)
MCHC: 34.3 g/dL (ref 30.0–36.0)
MCV: 95.1 fl (ref 78.0–100.0)
Monocytes Absolute: 0.5 10*3/uL (ref 0.1–1.0)
Monocytes Relative: 4.2 % (ref 3.0–12.0)
NEUTROS ABS: 8.8 10*3/uL — AB (ref 1.4–7.7)
Neutrophils Relative %: 74.2 % (ref 43.0–77.0)
PLATELETS: 265 10*3/uL (ref 150.0–400.0)
RBC: 4.47 Mil/uL (ref 3.87–5.11)
RDW: 12.4 % (ref 11.5–15.5)
WBC: 11.9 10*3/uL — AB (ref 4.0–10.5)

## 2018-01-01 LAB — TSH: TSH: 0.82 u[IU]/mL (ref 0.35–4.50)

## 2018-01-01 LAB — VITAMIN B12: Vitamin B-12: 867 pg/mL (ref 211–911)

## 2018-01-01 NOTE — Progress Notes (Signed)
Subjective:    Patient ID: Sara Barnes, female    DOB: 1941-01-10, 77 y.o.   MRN: [id number]  HPI Sara Barnes is a 77 year old female with a history of Crohn's colitis complicated by perianal fistulas, history of end ileostomy requiring revision for stricture, history of cecal perforation at the time of flexible sigmoidoscopy in November 2018 requiring right hemicolectomy, history of GERD with dyspepsia, and insomnia who is here for follow-up.  She is here today with her sister Vaughan Basta.  She was last seen on 10/22/2017  She has continued Entyvio therapy and her next dose is next week on 01/07/2018.  She reports that she feels exhausted.  She complains of decreased energy levels.  She is also having some headaches.  She is also noticed pain and "bulging" around her ileostomy in her right lower quadrant.  She states it is hard for her to bend without discomfort and that the ileostomy restricts her movement.  She states that because of the bulging it is hard to get her appliance to fit perfectly.  It seems to be functioning okay with soft to liquid greenish-brown stool.  No blood or melena.  Her rectal bleeding and perianal pain is definitively better.  She will occasionally pass scant amount of stool and very occasionally see minor bleeding.  One episode of vomiting which occurred in the middle the night last week.  Intermittent nausea.  She is using 10 mg of Ambien which she states is the only way she can really sleep.  She tried 5 mg and did not sleep well.  Most nights she goes to bed around 11 PM wakes up between 6 and 7 but stays in bed till about 9 AM.  She is taking B12 injections monthly, using famotidine usually twice daily, Zofran as needed and Prozac 20 mg daily.  She has remained on prednisone 5 mg.  She states she felt better on 10 mg but also would like to get off prednisone.  She very much wants to have her ileostomy reversed  Review of Systems As per HPI, otherwise  negative  Current Medications, Allergies, Past Medical History, Past Surgical History, Family History and Social History were reviewed in [employer] record.     Objective:   Physical Exam BP 114/72   Pulse 87   Ht 5' 6"  (1.676 m)   Wt 157 lb (71.2 kg)   BMI 25.34 kg/m  Constitutional: Well-developed and well-nourished. No distress. HEENT: Normocephalic and atraumatic.  Conjunctivae are normal.  No scleral icterus. Neck: Neck supple. Trachea midline. Cardiovascular: Normal rate, regular rhythm and intact distal pulses.  Pulmonary/chest: Effort normal and breath sounds normal. No wheezing, rales or rhonchi. 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 Bowel sounds active throughout. Extremities: no clubbing, cyanosis, or edema Neurological: Alert and oriented to person place and time. Skin: Skin is warm and dry. Psychiatric: Normal mood and affect. Behavior is normal.  CBC    Component Value Date/Time   WBC 11.9 (H) 01/01/2018 1558   RBC 4.47 01/01/2018 1558   HGB 14.6 01/01/2018 1558   HGB 13.7 12/06/2014 0804   HCT 42.5 01/01/2018 1558   HCT 41.1 12/06/2014 0804   PLT 265.0 01/01/2018 1558   PLT 303 12/06/2014 0804   MCV 95.1 01/01/2018 1558   MCV 95.8 12/06/2014 0804   MCH 32.9 06/09/2017 0348  MCHC 34.3 01/01/2018 1558   RDW 12.4 01/01/2018 1558   RDW 12.2 12/06/2014 0804   LYMPHSABS 2.5 01/01/2018 1558   LYMPHSABS 1.8 12/06/2014 0804   MONOABS 0.5 01/01/2018 1558   MONOABS 0.8 12/06/2014 0804   EOSABS 0.0 01/01/2018 1558   EOSABS 0.2 12/06/2014 0804   BASOSABS 0.1 01/01/2018 1558   BASOSABS 0.0 12/06/2014 0804   CMP     Component Value Date/Time   NA 133 (L) 01/01/2018 1558   NA 138 12/06/2014 0804   K 4.1 01/01/2018 1558   K 4.1 12/06/2014 0804   CL 102 01/01/2018 1558   CO2 22 01/01/2018  1558   CO2 25 12/06/2014 0804   GLUCOSE 120 (H) 01/01/2018 1558   GLUCOSE 92 12/06/2014 0804   BUN 15 01/01/2018 1558   BUN 12.3 12/06/2014 0804   CREATININE 0.94 01/01/2018 1558   CREATININE 0.8 12/06/2014 0804   CALCIUM 9.5 01/01/2018 1558   CALCIUM 9.4 12/06/2014 0804   PROT 7.4 01/01/2018 1558   PROT 6.5 12/06/2014 0804   ALBUMIN 4.2 01/01/2018 1558   ALBUMIN 3.3 (L) 12/06/2014 0804   AST 17 01/01/2018 1558   AST 9 12/06/2014 0804   ALT 11 01/01/2018 1558   ALT 10 12/06/2014 0804   ALKPHOS 74 01/01/2018 1558   ALKPHOS 89 12/06/2014 0804   BILITOT 0.6 01/01/2018 1558   BILITOT 0.37 12/06/2014 0804   GFRNONAA >60 06/09/2017 0348   GFRAA >60 06/09/2017 0348       Assessment & Plan:  77 year old female with a history of Crohn's colitis complicated by perianal fistulas, history of end ileostomy requiring revision for stricture, history of cecal perforation at the time of flexible sigmoidoscopy in November 2018 requiring right hemicolectomy, history of GERD with dyspepsia, and insomnia who is here for follow-up.  1.  Crohn's colitis with perianal involvement/history of perianal fistulas/end ileostomy --she remains very very interested in having her ileostomy reversed.  We discussed this again at length today.  I do think her colitis is improving with Entyvio.  Her albumin has normalized and her rectal bleeding is all but stopped.  There is been no further anemia.  She has weaned down to prednisone at very minimal dose, 5 mg daily.  My concern with ileostomy reversal has to do with her overall disease activity at the time of reversal.  I have explained to her that we would like her colitis to be in remission before reversal.  I am concerned for parastomal hernia based on the exam today.  I think this needs to be evaluated for surgical opinion.  She requests to be seen by a different colorectal surgeon.  I will refer her to see Dr. Drue Flirt at [facility] for her opinion regarding her  ileostomy, possible parastomal hernia and her opinion on whether or not we should consider reversing her ileostomy.  With healing of perianal disease this technically could be possible however we would likely want to know the state of her colitis before reversal.  At the time of her last plan colonoscopy, with change to flex sig based on severity of colitis, she had a cecal perforation despite the fact that the right colon was not seen.  Any repeat examination of the colon would also carry risk of another perforation which clearly we do not want.  We will await and I will appreciate Dr. Geronimo Boot opinion.  Continue Entyvio every 8 weeks.  Continue prednisone at 5 mg daily for now  2.  B12 deficiency --  continue monthly IM B12.  Check B12 levels today  3.  Fatigue --in part feel she is psychologically stressed by her ileostomy.  Also her Crohn's disease can certainly in part explain fatigue.  Check B12 levels today, TSH, CBC and CMP.  I have encouraged her to go outside for a walk as many days a week as she can to help build stamina.  4.  Insomnia --continue Ambien 10 mg nightly as needed  5.  GERD/dyspepsia --continue famotidine 20 mg every 12 hours  20-monthfollow-up with me, await surgical opinion

## 2018-01-01 NOTE — Patient Instructions (Addendum)
Your provider has requested that you go to the basement level for lab work before leaving today. Press "B" on the elevator. The lab is located at the first door on the left as you exit the elevator.  Please continue Entyvio, Ambien, and Prednisone at current dosages.  We will refer you to see Dr Lenna Sciara. [place]. You will be contacted with an appointment time and date once we have received that information.  Make sure to get some exercise in!!  Please follow up with Dr Hilarie Fredrickson in 3 months.  If you are age 56 or older, your body mass index should be between 23-30. Your Body mass index is 25.34 kg/m. If this is out of the aforementioned range listed, please consider follow up with your Primary Care Provider.  If you are age 18 or younger, your body mass index should be between 19-25. Your Body mass index is 25.34 kg/m. If this is out of the aformentioned range listed, please consider follow up with your Primary Care Provider.

## 2018-01-05 ENCOUNTER — Telehealth: Payer: Self-pay | Admitting: *Deleted

## 2018-01-05 NOTE — Telephone Encounter (Signed)
Patient has been scheduled to see Dr Renelda Mom for peristomal hernia, ? Ileostomy reversal on 01/29/18 at 9:20 am at the [facility] office ([address] Phone [phone]. I have sent 32 pages of records over to Dr Ashburn's office at fax [phone].  I have contacted patient to advise of appointment time, date and location. She verbalizes understanding.

## 2018-01-06 ENCOUNTER — Other Ambulatory Visit: Payer: Self-pay

## 2018-01-06 DIAGNOSIS — K5090 Crohn's disease, unspecified, without complications: Secondary | ICD-10-CM

## 2018-01-07 ENCOUNTER — Encounter (HOSPITAL_COMMUNITY)
Admission: RE | Admit: 2018-01-07 | Discharge: 2018-01-07 | Disposition: A | Discharge status: Home / Self Care | Payer: Medicare HMO | Source: Ambulatory Visit | Attending: Internal Medicine | Admitting: Internal Medicine

## 2018-01-07 ENCOUNTER — Telehealth: Payer: Self-pay

## 2018-01-07 DIAGNOSIS — K5090 Crohn's disease, unspecified, without complications: Secondary | ICD-10-CM | POA: Insufficient documentation

## 2018-01-07 NOTE — Discharge Instructions (Signed)
Entyvio dose will be held awaiting clearance from PCP (Primary Care Provider) or ENT MD.  Please follow up with PCP or ENT regarding sinus problems and then Dr Hilarie Fredrickson with results to reschedule Entyvio.

## 2018-01-07 NOTE — Telephone Encounter (Signed)
Would hold Entyvio today (not give) and have her seen by PCP or ENT for eval of sinus issues.  Sinusitis can be associated with Entyvio I would like to see the report from PCP/ENT and we will decide how to proceed with biologic JMP

## 2018-01-07 NOTE — Telephone Encounter (Signed)
Pt needs ov unless seeing ent

## 2018-01-07 NOTE — Telephone Encounter (Signed)
Pt at [facility] for Entyvio. Kim RN calling states pt is c/o sinus problems that started Saturday. Also her Left eye is swollen and a little red, left check and eye are sore. Reports after her last entyvio infusions she reports it being more difficult to take a deep breath. Calling to see if ok to proceed with Entyvio. Please advise.

## 2018-01-07 NOTE — Telephone Encounter (Signed)
Spoke with Romie Minus and she is aware and will let pt know. Note CC'd to PCP.

## 2018-01-07 NOTE — Telephone Encounter (Signed)
Patient has an appointment tomorrow

## 2018-01-07 NOTE — Progress Notes (Signed)
Gave Pt written discharge instructions to follow up with PCP or ENT MD regarding potential sinus infection and then again with Dr Hilarie Fredrickson to reschedule Entyvio infusion.

## 2018-01-08 ENCOUNTER — Ambulatory Visit (INDEPENDENT_AMBULATORY_CARE_PROVIDER_SITE_OTHER): Payer: Medicare HMO | Admitting: Family Medicine

## 2018-01-08 ENCOUNTER — Encounter: Payer: Self-pay | Admitting: Family Medicine

## 2018-01-08 VITALS — BP 124/71 | HR 88 | Temp 98.9°F | Resp 16 | Ht 66.0 in | Wt 158.8 lb

## 2018-01-08 DIAGNOSIS — R300 Dysuria: Secondary | ICD-10-CM

## 2018-01-08 DIAGNOSIS — J0140 Acute pansinusitis, unspecified: Secondary | ICD-10-CM | POA: Diagnosis not present

## 2018-01-08 LAB — POC URINALSYSI DIPSTICK (AUTOMATED)
BILIRUBIN UA: NEGATIVE
GLUCOSE UA: NEGATIVE
Ketones, UA: NEGATIVE
Leukocytes, UA: NEGATIVE
NITRITE UA: NEGATIVE
Protein, UA: NEGATIVE
Spec Grav, UA: >=1.03 — AB (ref 1.010–1.025)
UROBILINOGEN UA: 0.2 U/dL
pH, UA: 6 (ref 5.0–8.0)

## 2018-01-08 MED ORDER — AMOXICILLIN-POT CLAVULANATE 875-125 MG PO TABS: 1.0000 | ORAL_TABLET | Freq: Two times a day (BID) | ORAL | 0 refills | Status: DC

## 2018-01-08 MED ORDER — METHYLPREDNISOLONE ACETATE 40 MG/ML IJ SUSP
40.0000 mg | Freq: Once | INTRAMUSCULAR | Status: AC
  Administered 2018-01-08: 40 mg via INTRAMUSCULAR

## 2018-01-08 NOTE — Patient Instructions (Signed)

## 2018-01-08 NOTE — Assessment & Plan Note (Signed)
flonase and antihistamine Depo medrol 40 mg  abx per orders rto prn

## 2018-01-08 NOTE — Progress Notes (Signed)
Patient ID: Sara Barnes, female    DOB: 1941-01-12  Age: 77 y.o. MRN: [id number]    Subjective:  Subjective  HPI Sara Barnes presents for sinus pressure, congestion and hx low grade fever.  Pt could not get entyvio yesterday due to fever and sinus pain.    Review of Systems  Constitutional: Positive for chills. Negative for fever.  HENT: Positive for congestion, postnasal drip, rhinorrhea, sinus pressure and sinus pain.   Respiratory: Positive for cough. Negative for chest tightness, shortness of breath and wheezing.   Cardiovascular: Negative for chest pain, palpitations and leg swelling.  Allergic/Immunologic: Negative for environmental allergies.    History Past Medical History:  Diagnosis Date  . Allergy   . Anemia   . Basal cell carcinoma   . Blood transfusion without reported diagnosis   . Bowel perforation ([place])   . Cancer of lower-inner quadrant of female breast ([place]) 11/30/2014   left  . Cancer of lower-inner quadrant of left female breast ([place]) 11/30/2014  . Complication of anesthesia    anxiety post-op after ear surgeries  . 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 Dental crowns present    recent root canal 11/2014  . Depression   . Family history of adverse reaction to anesthesia    pt's sister has hx. of post-op N/V  . GERD (gastroesophageal reflux disease)   . Indigestion   . Perianal abscess   . PONV (postoperative nausea and vomiting)   . Runny nose 12/07/2014   clear drainage, per pt.  . Seasonal allergies   . TMJ (temporomandibular joint syndrome)   . Ulcerative colitis   . Vitamin B12 deficiency     She has a past surgical history that includes Abdominal hysterectomy; Appendectomy; Breast enhancement surgery; Incision and drainage perirectal abscess (N/A, 09/28/2013); Breast lumpectomy (Left); Inner ear surgery (Bilateral); Colonoscopy with propofol (05/28/2011; 06/28/2014); Breast lumpectomy with radioactive seed and sentinel lymph node  biopsy (Left, 12/13/2014); Excision basal cell carcinoma (Left); Flexible sigmoidoscopy (N/A, 01/08/2016); Incision and drainage perirectal abscess (N/A, 01/22/2016); Laparoscopic diverted colostomy (N/A, 02/11/2016); Incision and drainage abscess (2018); laparoscopy (N/A, 03/23/2017); Ileostomy (N/A, 05/25/2017); Ileostomy (02/11/2016); ILEOSTOMY DILATION (05/25/2017); Ileostomy closure (N/A, 06/02/2017); ileostomy stricture resection (06/02/2017); and Breast biopsy (Left, 2019).   Her family history includes Anesthesia problems in her sister; Asthma in her sister; Brain cancer in her father; Diabetes in her brother; Emphysema in her maternal grandfather; Kidney disease in her brother; Prostate cancer (age of onset: 70) in her brother; Rheum arthritis in her brother and mother.She reports that she has never smoked. She has never used smokeless tobacco. She reports that she drinks about 14.0 standard drinks of alcohol per week. She reports that she does not use drugs.  Current Outpatient Medications on File Prior to Visit  Medication Sig Dispense Refill  . acetaminophen (TYLENOL) 500 MG tablet You can take 2 tablets every 6 hours as needed for pain.  Use this as your primary pain control.  Use Tramadol as a last resort.  You can buy this over the counter at any drug store.    DO NOT TAKE MORE THAN 4000 MG OF TYLENOL PER DAY.  IT CAN HARM YOUR LIVER. 30 tablet 0  . cyanocobalamin (,VITAMIN B-12,) 1000 MCG/ML injection INJECT 1 ML INTRAMUSCULARLY EVERY 30 DAYS 3 mL 0  . famotidine (PEPCID) 40 MG tablet Take 1 tablet (40 mg total) by mouth 2 (two) times daily. (Patient taking differently: Take 40 mg by  mouth daily as needed for heartburn or indigestion. )    . FLUoxetine (PROZAC) 20 MG capsule TAKE 1 CAPSULE TWICE DAILY 180 capsule 1  . ondansetron (ZOFRAN ODT) 4 MG disintegrating tablet Take 1 tablet (4 mg total) by mouth every 6 (six) hours as needed for nausea or vomiting. 90 tablet 1  . predniSONE  (DELTASONE) 5 MG tablet TAKE ONE TABLET BY MOUTH DAILY WITH BREAKFAST 50 tablet 0  . vedolizumab (ENTYVIO) 300 MG injection Inject into the vein.    Marland Kitchen zolpidem (AMBIEN) 10 MG tablet TAKE ONE-HALF TABLET BY MOUTH TO 1 AT BEDTIME AS NEEDED 30 tablet 2   No current facility-administered medications on file prior to visit.      Objective:  Objective  Physical Exam  Constitutional: She is oriented to person, place, and time. She appears well-developed and well-nourished.  HENT:  Right Ear: External ear normal.  Left Ear: External ear normal.  Nose: Right sinus exhibits no maxillary sinus tenderness and no frontal sinus tenderness. Left sinus exhibits maxillary sinus tenderness and frontal sinus tenderness.  + PND + errythema  Eyes: Conjunctivae are normal. Right eye exhibits no discharge. Left eye exhibits no discharge.  Cardiovascular: Normal rate, regular rhythm and normal heart sounds.  No murmur heard. Pulmonary/Chest: Effort normal and breath sounds normal. No respiratory distress. She has no wheezes. She has no rales. She exhibits no tenderness.  Musculoskeletal: She exhibits no edema.  Lymphadenopathy:    She has cervical adenopathy.  Neurological: She is alert and oriented to person, place, and time.  Nursing note and vitals reviewed.  BP 124/71 (BP Location: Right Arm, Patient Position: Sitting, Cuff Size: Small)   Pulse 88   Temp 98.9 F (37.2 C) (Oral)   Resp 16   Ht 5' 6"  (1.676 m)   Wt 158 lb 12.8 oz (72 kg)   SpO2 98%   BMI 25.63 kg/m  Wt Readings from Last 3 Encounters:  01/08/18 158 lb 12.8 oz (72 kg)  01/01/18 157 lb (71.2 kg)  11/12/17 144 lb (65.3 kg)     Lab Results  Component Value Date   WBC 11.9 (H) 01/01/2018   HGB 14.6 01/01/2018   HCT 42.5 01/01/2018   PLT 265.0 01/01/2018   GLUCOSE 120 (H) 01/01/2018   CHOL 224 (H) 01/07/2017   TRIG 179 (H) 06/01/2017   HDL 94.90 01/07/2017   LDLDIRECT 110.0 01/07/2017   LDLCALC 70 10/02/2016   ALT 11  01/01/2018   AST 17 01/01/2018   NA 133 (L) 01/01/2018   K 4.1 01/01/2018   CL 102 01/01/2018   CREATININE 0.94 01/01/2018   BUN 15 01/01/2018   CO2 22 01/01/2018   TSH 0.82 01/01/2018   INR 0.9 09/11/2017   HGBA1C 5.5 12/18/2010   MICROALBUR <0.7 01/30/2015    No results found.   Assessment & Plan:  Plan  I am having Sara Barnes start on amoxicillin-clavulanate. I am also having her maintain her famotidine, acetaminophen, FLUoxetine, vedolizumab, zolpidem, ondansetron, predniSONE, and cyanocobalamin. We administered methylPREDNISolone acetate.  Meds ordered this encounter  Medications  . amoxicillin-clavulanate (AUGMENTIN) 875-125 MG tablet    Sig: Take 1 tablet by mouth 2 (two) times daily.    Dispense:  20 tablet    Refill:  0  . methylPREDNISolone acetate (DEPO-MEDROL) injection 40 mg    Problem List Items Addressed This Visit      Unprioritized   Acute pansinusitis - Primary    flonase and antihistamine Depo medrol  40 mg  abx per orders rto prn      Relevant Medications   amoxicillin-clavulanate (AUGMENTIN) 875-125 MG tablet   methylPREDNISolone acetate (DEPO-MEDROL) injection 40 mg (Completed)    Other Visit Diagnoses    Dysuria       Relevant Orders   POCT Urinalysis Dipstick (Automated) (Completed)   Urine Culture      Follow-up: No follow-ups on file.  Ann Held, DO

## 2018-01-09 LAB — URINE CULTURE
MICRO NUMBER:: 91100342
SPECIMEN QUALITY:: ADEQUATE

## 2018-01-14 ENCOUNTER — Telehealth: Payer: Self-pay | Admitting: Family Medicine

## 2018-01-14 NOTE — Telephone Encounter (Signed)
Spoke with Sara Barnes regarding AWV. Patient stated that she does not want to schedule appointment at this time, but she will give office a call next year, (2020), when she is ready to schedule her wellness visit. SF

## 2018-01-18 ENCOUNTER — Other Ambulatory Visit: Payer: Self-pay | Admitting: Internal Medicine

## 2018-01-18 DIAGNOSIS — K50113 Crohn's disease of large intestine with fistula: Secondary | ICD-10-CM

## 2018-01-22 DIAGNOSIS — Z932 Ileostomy status: Secondary | ICD-10-CM | POA: Diagnosis not present

## 2018-01-22 DIAGNOSIS — K5090 Crohn's disease, unspecified, without complications: Secondary | ICD-10-CM | POA: Diagnosis not present

## 2018-01-27 ENCOUNTER — Other Ambulatory Visit: Payer: Self-pay | Admitting: Internal Medicine

## 2018-01-29 DIAGNOSIS — K50114 Crohn's disease of large intestine with abscess: Secondary | ICD-10-CM | POA: Diagnosis not present

## 2018-02-10 ENCOUNTER — Other Ambulatory Visit (HOSPITAL_COMMUNITY): Payer: Self-pay | Admitting: General Surgery

## 2018-02-10 DIAGNOSIS — K50914 Crohn's disease, unspecified, with abscess: Secondary | ICD-10-CM

## 2018-02-11 ENCOUNTER — Telehealth: Payer: Self-pay | Admitting: Internal Medicine

## 2018-02-11 NOTE — Telephone Encounter (Signed)
Pt states she saw Dr. Drue Flirt and she did confirm the hernia and also did a rectal exam. She is going to schedule her for surgery at [facility]. She is scheduled for a barium enema next Friday at [facility]. She may do a reversal based on the results of the tests. Pt states she missed her last entyvio infusion due to having a fever and needing antibiotics for a sinus infection. Pt wants to know if she needs to reschedule the entyvio or wait until after the surgery. Please advise.

## 2018-02-11 NOTE — Telephone Encounter (Signed)
Pt would like to speak with you regarding entyvio.

## 2018-02-11 NOTE — Telephone Encounter (Signed)
My recommendation will be that she continue Entyvio given the severity of her colitis however would defer this ultimately to Dr. Drue Flirt particularly with upcoming surgical intervention Can proceed with repeat dosing if okay with Dr. Drue Flirt

## 2018-02-12 NOTE — Telephone Encounter (Signed)
Left message with Dr. Geronimo Boot office regarding entyvio.

## 2018-02-16 NOTE — Telephone Encounter (Signed)
Called Dr. Orville Govern office last week. Friday they returned the call and stated surgery was not scheduled yet. Informed them barium enema is scheduled 02/19/18. They were going to go back to the surgeon and see what she wanted to do regarding the entyvio. Explained this to pt and let her know that if I do not hear back from [facility] today I will call them again.

## 2018-02-16 NOTE — Telephone Encounter (Signed)
Pt calling to follow up on this message, pt is getting impatient as she has not heard back from [place] and is not feeling well. She is requesting that someone calls her today to let her know the status of her request.

## 2018-02-19 ENCOUNTER — Ambulatory Visit (HOSPITAL_COMMUNITY)
Admission: RE | Admit: 2018-02-19 | Discharge: 2018-02-19 | Disposition: A | Discharge status: Home / Self Care | Payer: Medicare HMO | Source: Ambulatory Visit | Attending: General Surgery | Admitting: General Surgery

## 2018-02-19 ENCOUNTER — Telehealth: Payer: Self-pay

## 2018-02-19 ENCOUNTER — Other Ambulatory Visit (HOSPITAL_COMMUNITY): Payer: Self-pay | Admitting: General Surgery

## 2018-02-19 DIAGNOSIS — K50914 Crohn's disease, unspecified, with abscess: Secondary | ICD-10-CM

## 2018-02-19 DIAGNOSIS — Z539 Procedure and treatment not carried out, unspecified reason: Secondary | ICD-10-CM | POA: Insufficient documentation

## 2018-02-19 DIAGNOSIS — Z0389 Encounter for observation for other suspected diseases and conditions ruled out: Secondary | ICD-10-CM | POA: Diagnosis not present

## 2018-02-19 NOTE — Telephone Encounter (Signed)
Dr. Drue Flirt is ok with her proceeding with [facility] treatment. She was unable to complete the barium enema that was scheduled for today, stated it was to painful. Pt reports she will now need a colonoscopy since she could complete the BE. Please advise.

## 2018-02-24 NOTE — Telephone Encounter (Signed)
Pt was not able to complete the barium enema due to being in such pain. Pt states she needs to now be scheduled for a colon. Dr. Hilarie Fredrickson is trying to reach Dr. Drue Flirt to discuss this. Waiting to hear back after they have a chance to discuss the case.

## 2018-02-25 NOTE — Telephone Encounter (Signed)
Pt is still waiting for a cb regarding entyvio and about surgery/

## 2018-02-26 NOTE — Telephone Encounter (Signed)
Ms. Kuck is calling back regarding colon/entyvio. Have you been able to speak with Dr. Drue Flirt?

## 2018-02-26 NOTE — Telephone Encounter (Signed)
Not yet I have reached back out to her today

## 2018-02-26 NOTE — Telephone Encounter (Signed)
Spoke with pt and let her know we are waiting to discuss with Dr. Drue Flirt, she appreciated the call. Pt not really wanting to have another colon, wonders if a CT would tell her what she needs to know. Let her know I would let Dr. Hilarie Fredrickson know.

## 2018-03-02 NOTE — Telephone Encounter (Signed)
I was able to speak to Dr. Drue Flirt by phone today She is agreeable/willing to take down the patient's ileostomy which the patient remains very adamant to try.  The patient was advised that doing so may worsen her perianal disease.  The patient voiced understanding and reported that she would rather have the ostomy reversed and fail, thus needing another ostomy than not try. She tried barium enema at [facility] but could not tolerate  Dr. Drue Flirt would like me to perform a colonoscopy if possible before surgery.  If colitis is severe she would likely worsen with reversal.  We will plan unprepped, as the patient has an end ileostomy, colonoscopy in the [facility] with propofol sedation. This procedure is higher than average risk given her history of severe colitis and prior right colon perforation after a flexible sigmoidoscopy in the past.  Once these results are available I will forward them to Dr. Drue Flirt for review and she can determine fitness for reversal of ileostomy and timing  Patient should continue with [facility] as scheduled

## 2018-03-02 NOTE — Telephone Encounter (Signed)
Spoke with pt and she is aware and knows to keep entyvio appt. Will call pt with colon appt as soon as scheduled.

## 2018-03-03 NOTE — Telephone Encounter (Signed)
Pt scheduled for previsit 03/11/18@2 :30pm, colon scheduled in the [place] 03/17/18@9am . Pt aware of appts. Pt wants to let Dr. Norman Herrlich know that her hemorrhoids were aggravated from the barium enema. Also wants him to let her know if she should get the flu shot. Please advise.

## 2018-03-03 NOTE — Telephone Encounter (Signed)
Would have her try over-the-counter Preparation H suppository for her hemorrhoid flare  She should get her flu vaccine; Weyman Rodney will not interfere with this vaccination

## 2018-03-03 NOTE — Telephone Encounter (Signed)
Pt aware. Scheduled for flu shot 03/11/18@3pm .

## 2018-03-04 ENCOUNTER — Encounter (HOSPITAL_COMMUNITY): Payer: Self-pay

## 2018-03-04 ENCOUNTER — Telehealth: Payer: Self-pay

## 2018-03-04 ENCOUNTER — Encounter (HOSPITAL_COMMUNITY)
Admission: RE | Admit: 2018-03-04 | Discharge: 2018-03-04 | Disposition: A | Discharge status: Home / Self Care | Payer: Medicare HMO | Source: Ambulatory Visit | Attending: Internal Medicine | Admitting: Internal Medicine

## 2018-03-04 DIAGNOSIS — K5090 Crohn's disease, unspecified, without complications: Secondary | ICD-10-CM | POA: Diagnosis not present

## 2018-03-04 MED ORDER — SODIUM CHLORIDE 0.9 % IV SOLN
Freq: Once | INTRAVENOUS | Status: AC
  Administered 2018-03-04: 11:00:00 via INTRAVENOUS

## 2018-03-04 MED ORDER — VEDOLIZUMAB 300 MG IV SOLR
300.0000 mg | Freq: Once | INTRAVENOUS | Status: AC
  Administered 2018-03-04: 300 mg via INTRAVENOUS
  Filled 2018-03-04: qty 5

## 2018-03-04 NOTE — Telephone Encounter (Signed)
Noted,

## 2018-03-04 NOTE — Telephone Encounter (Signed)
Patient is coming in for a previsit on 03-11-18. I read telephone encounter that you said pt will have a unprepped procedure. Please advised if she is going to need a prep or special diet prior or any instructions we need to add for instructions. Dx Crohns of large intestine with fistula. Thanks in advance

## 2018-03-04 NOTE — Telephone Encounter (Signed)
No prep will be needed.  She has an end ileostomy and all of her bowel contents/stool drains through her ileostomy and is not connected to her colon No need to change diet prior to procedure or perform bowel prep

## 2018-03-05 ENCOUNTER — Other Ambulatory Visit: Payer: Self-pay | Admitting: General Surgery

## 2018-03-05 DIAGNOSIS — N6320 Unspecified lump in the left breast, unspecified quadrant: Secondary | ICD-10-CM

## 2018-03-11 ENCOUNTER — Encounter: Payer: Self-pay | Admitting: Internal Medicine

## 2018-03-11 ENCOUNTER — Ambulatory Visit (AMBULATORY_SURGERY_CENTER): Payer: Self-pay

## 2018-03-11 ENCOUNTER — Ambulatory Visit (INDEPENDENT_AMBULATORY_CARE_PROVIDER_SITE_OTHER): Payer: Medicare HMO | Admitting: Internal Medicine

## 2018-03-11 VITALS — Ht 66.0 in | Wt 160.8 lb

## 2018-03-11 DIAGNOSIS — Z23 Encounter for immunization: Secondary | ICD-10-CM

## 2018-03-11 DIAGNOSIS — K50113 Crohn's disease of large intestine with fistula: Secondary | ICD-10-CM

## 2018-03-11 NOTE — Progress Notes (Signed)
Denies allergies to eggs or soy products. Denies complication of anesthesia or sedation. Denies use of weight loss medication. Denies use of O2.   Emmi instructions declined.   Modified prep instructions were given to the patient. Patient is not going to drink a prep because she has an ileostomy. Patient verbalizes instructions for procedure.

## 2018-03-15 ENCOUNTER — Other Ambulatory Visit: Payer: Self-pay | Admitting: Internal Medicine

## 2018-03-15 DIAGNOSIS — K50113 Crohn's disease of large intestine with fistula: Secondary | ICD-10-CM

## 2018-03-17 ENCOUNTER — Encounter: Payer: Self-pay | Admitting: Internal Medicine

## 2018-03-17 ENCOUNTER — Ambulatory Visit (AMBULATORY_SURGERY_CENTER): Payer: Medicare HMO | Admitting: Internal Medicine

## 2018-03-17 VITALS — BP 132/75 | HR 67 | Temp 96.9°F | Resp 17 | Ht 66.0 in | Wt 160.0 lb

## 2018-03-17 DIAGNOSIS — K50113 Crohn's disease of large intestine with fistula: Secondary | ICD-10-CM

## 2018-03-17 DIAGNOSIS — K5190 Ulcerative colitis, unspecified, without complications: Secondary | ICD-10-CM | POA: Diagnosis not present

## 2018-03-17 MED ORDER — SODIUM CHLORIDE 0.9 % IV SOLN: 500.0000 mL | Freq: Once | INTRAVENOUS | Status: DC

## 2018-03-17 NOTE — Progress Notes (Signed)
Report given to PACU, vss 

## 2018-03-17 NOTE — Patient Instructions (Signed)
YOU HAD AN ENDOSCOPIC PROCEDURE TODAY AT [facility]:   Refer to the procedure report that was given to you for any specific questions about what was found during the examination.  If the procedure report does not answer your questions, please call your gastroenterologist to clarify.  If you requested that your care partner not be given the details of your procedure findings, then the procedure report has been included in a sealed envelope for you to review at your convenience later.  YOU SHOULD EXPECT: Some feelings of bloating in the abdomen. Passage of more gas than usual.  Walking can help get rid of the air that was put into your GI tract during the procedure and reduce the bloating. If you had a lower endoscopy (such as a colonoscopy or flexible sigmoidoscopy) you may notice spotting of blood in your stool or on the toilet paper. If you underwent a bowel prep for your procedure, you may not have a normal bowel movement for a few days.  Please Note:  You might notice some irritation and congestion in your nose or some drainage.  This is from the oxygen used during your procedure.  There is no need for concern and it should clear up in a day or so.  SYMPTOMS TO REPORT IMMEDIATELY:   Following lower endoscopy (colonoscopy or flexible sigmoidoscopy):  Excessive amounts of blood in the stool  Significant tenderness or worsening of abdominal pains  Swelling of the abdomen that is new, acute  Fever of 100F or higher   For urgent or emergent issues, a gastroenterologist can be reached at any hour by calling [phone].   DIET:  We do recommend a small meal at first, but then you may proceed to your regular diet.  Drink plenty of fluids but you should avoid alcoholic beverages for 24 hours.  ACTIVITY:  You should plan to take it easy for the rest of today and you should NOT DRIVE or use heavy machinery until tomorrow (because of the sedation medicines used during the test).     FOLLOW UP: Our staff will call the number listed on your records the next business day following your procedure to check on you and address any questions or concerns that you may have regarding the information given to you following your procedure. If we do not reach you, we will leave a message.  However, if you are feeling well and you are not experiencing any problems, there is no need to return our call.  We will assume that you have returned to your regular daily activities without incident.  If any biopsies were taken you will be contacted by phone or by letter within the next 1-3 weeks.  Please call us at [phone] if you have not heard about the biopsies in 3 weeks.    SIGNATURES/CONFIDENTIALITY: You and/or your care partner have signed paperwork which will be entered into your electronic medical record.  These signatures attest to the fact that that the information above on your After Visit Summary has been reviewed and is understood.  Full responsibility of the confidentiality of this discharge information lies with you and/or your care-partner.   Continue Entyvio. Return to Dr. Drue Flirt, Dr. Hilarie Fredrickson will be in touch with her as well.

## 2018-03-17 NOTE — Op Note (Addendum)
[place] Patient Name: Sara Barnes Procedure Date: 03/17/2018 8:38 AM MRN: [id number] Endoscopist: Jerene Bears , MD Age: 77 Referring MD:  Date of Birth: 1941-04-20 Gender: Female Account #: [id number] Procedure:                Colonoscopy Indications:              Crohn's disease of the colon with perianal                            involvement, Disease activity assessment of Crohn's                            disease of the colon, status post end ileostomy for                            severe colonic Crohn's with perianal involvement Medicines:                Monitored Anesthesia Care Procedure:                Pre-Anesthesia Assessment:                           - Prior to the procedure, a History and Physical                            was performed, and patient medications and                            allergies were reviewed. The patient's tolerance of                            previous anesthesia was also reviewed. The risks                            and benefits of the procedure and the sedation                            options and risks were discussed with the patient.                            All questions were answered, and informed consent                            was obtained. Prior Anticoagulants: The patient has                            taken no previous anticoagulant or antiplatelet                            agents. ASA Grade Assessment: III - A patient with                            severe systemic disease. After reviewing the risks  and benefits, the patient was deemed in                            satisfactory condition to undergo the procedure.                           After obtaining informed consent, the colonoscope                            was passed under direct vision. Throughout the                            procedure, the patient's blood pressure, pulse, and                            oxygen  saturations were monitored continuously. The                            Model PCF-[id number]) scope was introduced                            through the anus with the intention of advancing to                            the cecum. The scope was advanced to the sigmoid                            colon before the procedure was aborted. Medications                            were given. The colonoscopy was performed without                            difficulty. The patient tolerated the procedure                            well. The quality of the bowel preparation was                            good. The rectum was photographed. No bowel                            preparation was used. Scope In: 8:45:56 AM Scope Out: 8:49:23 AM Total Procedure Duration: 0 hours 3 minutes 27 seconds  Findings:                 A benign-appearing, intrinsic moderate stenosis                            measuring 1 cm (in length) was found at the anus                            and was traversed. After digital examination (using  fifth finger), there was mucosal disruption in the                            anal canal.                           Inflammation characterized by congestion (edema),                            friability (contact oozing and mucosal disruption)                            with scarring was found in a continuous and                            circumferential pattern from the rectum to the                            sigmoid colon. This was mild in severity, and when                            compared to previous examinations, the findings are                            improved. Multiple biopsies were obtained with cold                            forceps for histology randomly in the rectum and in                            the distal sigmoid colon. The examination was                            aborted in the sigmoid due to fragility of the                             mucosal and concern for perforation in the setting                            of significant scarring and long-standing IBD.                           Retroflexion in the rectum was not performed. Complications:            No immediate complications. Estimated Blood Loss:     Estimated blood loss was minimal. 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                           - Crohn's disease with colonic involvement. Mild                            inflammation was found from the rectum to the  sigmoid colon. Significant mucosal scarring and                            friability/fragility. See above.                           - Multiple biopsies were obtained in the rectum and                            in the distal sigmoid colon. Recommendation:           - Patient has a contact number available for                            emergencies. The signs and symptoms of potential                            delayed complications were discussed with the                            patient. Return to normal activities tomorrow.                            Written discharge instructions were provided to the                            patient.                           - Resume previous diet.                           - Continue present medications including Entyvio.                           - Await pathology results.                           - Return visit with Dr. Drue Flirt. I will communicate                            with Dr. Drue Flirt also. Jerene Bears, MD 03/17/2018 9:04:46 AM This report has been signed electronically.

## 2018-03-17 NOTE — Progress Notes (Signed)
Called to room to assist during endoscopic procedure.  Patient ID and intended procedure confirmed with present staff. Received instructions for my participation in the procedure from the performing physician.  

## 2018-03-18 ENCOUNTER — Other Ambulatory Visit: Payer: Self-pay | Admitting: General Surgery

## 2018-03-18 ENCOUNTER — Telehealth: Payer: Self-pay

## 2018-03-18 ENCOUNTER — Other Ambulatory Visit: Payer: Self-pay

## 2018-03-18 DIAGNOSIS — N6320 Unspecified lump in the left breast, unspecified quadrant: Secondary | ICD-10-CM

## 2018-03-18 NOTE — Telephone Encounter (Signed)
  Follow up Call-  Call back number 03/17/2018 03/23/2017 04/17/2016 09/25/2015  Post procedure Call Back phone  # [phone]  Permission to leave phone message Yes Yes Yes Yes  Some recent data might be hidden     Patient questions:  Do you have a fever, pain , or abdominal swelling? No. Pain Score  0 *  Have you tolerated food without any problems? Yes.    Have you been able to return to your normal activities? Yes.    Do you have any questions about your discharge instructions: Diet   No. Medications  No. Follow up visit  No.  Do you have questions or concerns about your Care? No.  Actions: * If pain score is 4 or above: No action needed, pain <4.

## 2018-03-22 ENCOUNTER — Ambulatory Visit
Admission: RE | Admit: 2018-03-22 | Discharge: 2018-03-22 | Disposition: A | Discharge status: Home / Self Care | Payer: Medicare HMO | Source: Ambulatory Visit | Attending: General Surgery | Admitting: General Surgery

## 2018-03-22 DIAGNOSIS — Z853 Personal history of malignant neoplasm of breast: Secondary | ICD-10-CM | POA: Diagnosis not present

## 2018-03-22 DIAGNOSIS — N6320 Unspecified lump in the left breast, unspecified quadrant: Secondary | ICD-10-CM

## 2018-03-22 DIAGNOSIS — R928 Other abnormal and inconclusive findings on diagnostic imaging of breast: Secondary | ICD-10-CM | POA: Diagnosis not present

## 2018-03-22 HISTORY — DX: Personal history of irradiation: Z92.3

## 2018-03-23 DIAGNOSIS — R109 Unspecified abdominal pain: Secondary | ICD-10-CM | POA: Diagnosis not present

## 2018-03-23 DIAGNOSIS — K5010 Crohn's disease of large intestine without complications: Secondary | ICD-10-CM | POA: Diagnosis not present

## 2018-03-23 DIAGNOSIS — Z432 Encounter for attention to ileostomy: Secondary | ICD-10-CM | POA: Diagnosis not present

## 2018-03-23 DIAGNOSIS — K6289 Other specified diseases of anus and rectum: Secondary | ICD-10-CM | POA: Diagnosis not present

## 2018-03-23 DIAGNOSIS — Z932 Ileostomy status: Secondary | ICD-10-CM | POA: Diagnosis not present

## 2018-03-23 DIAGNOSIS — K624 Stenosis of anus and rectum: Secondary | ICD-10-CM | POA: Diagnosis not present

## 2018-03-23 DIAGNOSIS — K435 Parastomal hernia without obstruction or  gangrene: Secondary | ICD-10-CM | POA: Diagnosis not present

## 2018-03-23 DIAGNOSIS — K5090 Crohn's disease, unspecified, without complications: Secondary | ICD-10-CM | POA: Diagnosis not present

## 2018-03-24 ENCOUNTER — Other Ambulatory Visit: Payer: Self-pay | Admitting: Internal Medicine

## 2018-03-31 ENCOUNTER — Other Ambulatory Visit: Payer: Self-pay | Admitting: Internal Medicine

## 2018-03-31 DIAGNOSIS — K6289 Other specified diseases of anus and rectum: Secondary | ICD-10-CM

## 2018-03-31 DIAGNOSIS — E538 Deficiency of other specified B group vitamins: Secondary | ICD-10-CM

## 2018-03-31 DIAGNOSIS — K51911 Ulcerative colitis, unspecified with rectal bleeding: Secondary | ICD-10-CM

## 2018-04-05 DIAGNOSIS — M25531 Pain in right wrist: Secondary | ICD-10-CM | POA: Diagnosis not present

## 2018-04-05 DIAGNOSIS — Z9889 Other specified postprocedural states: Secondary | ICD-10-CM | POA: Diagnosis not present

## 2018-04-05 DIAGNOSIS — K219 Gastro-esophageal reflux disease without esophagitis: Secondary | ICD-10-CM | POA: Diagnosis not present

## 2018-04-05 DIAGNOSIS — F329 Major depressive disorder, single episode, unspecified: Secondary | ICD-10-CM | POA: Diagnosis not present

## 2018-04-05 DIAGNOSIS — K660 Peritoneal adhesions (postprocedural) (postinfection): Secondary | ICD-10-CM | POA: Diagnosis not present

## 2018-04-05 DIAGNOSIS — G4700 Insomnia, unspecified: Secondary | ICD-10-CM | POA: Diagnosis not present

## 2018-04-05 DIAGNOSIS — Z932 Ileostomy status: Secondary | ICD-10-CM | POA: Diagnosis not present

## 2018-04-05 DIAGNOSIS — Z432 Encounter for attention to ileostomy: Secondary | ICD-10-CM | POA: Diagnosis not present

## 2018-04-05 DIAGNOSIS — K435 Parastomal hernia without obstruction or  gangrene: Secondary | ICD-10-CM | POA: Diagnosis not present

## 2018-04-05 DIAGNOSIS — F419 Anxiety disorder, unspecified: Secondary | ICD-10-CM | POA: Diagnosis not present

## 2018-04-05 DIAGNOSIS — K567 Ileus, unspecified: Secondary | ICD-10-CM | POA: Diagnosis not present

## 2018-04-05 DIAGNOSIS — K6389 Other specified diseases of intestine: Secondary | ICD-10-CM | POA: Diagnosis not present

## 2018-04-05 DIAGNOSIS — G8918 Other acute postprocedural pain: Secondary | ICD-10-CM | POA: Diagnosis not present

## 2018-04-05 DIAGNOSIS — Z87898 Personal history of other specified conditions: Secondary | ICD-10-CM | POA: Diagnosis not present

## 2018-04-05 DIAGNOSIS — K624 Stenosis of anus and rectum: Secondary | ICD-10-CM | POA: Diagnosis not present

## 2018-04-05 DIAGNOSIS — H9190 Unspecified hearing loss, unspecified ear: Secondary | ICD-10-CM | POA: Diagnosis not present

## 2018-04-08 HISTORY — PX: EXPLORATORY LAPAROTOMY: SUR591

## 2018-04-09 ENCOUNTER — Encounter: Payer: Medicare HMO | Admitting: Family Medicine

## 2018-04-12 MED ORDER — PREGABALIN 75 MG PO CAPS
75.00 | ORAL_CAPSULE | ORAL | Status: DC
Start: 2018-04-12 — End: 2018-04-12

## 2018-04-12 MED ORDER — FLUOXETINE HCL 20 MG PO CAPS
20.00 | ORAL_CAPSULE | ORAL | Status: DC
Start: 2018-04-12 — End: 2018-04-12

## 2018-04-12 MED ORDER — ENOXAPARIN SODIUM 40 MG/0.4ML ~~LOC~~ SOLN
40.00 | SUBCUTANEOUS | Status: DC
Start: 2018-04-13 — End: 2018-04-12

## 2018-04-12 MED ORDER — GENERIC EXTERNAL MEDICATION
5.00 | Status: DC
Start: ? — End: 2018-04-12

## 2018-04-12 MED ORDER — ONDANSETRON HCL 4 MG/2ML IJ SOLN
4.00 | INTRAMUSCULAR | Status: DC
Start: ? — End: 2018-04-12

## 2018-04-12 MED ORDER — OXYCODONE HCL 5 MG PO TABS
5.00 | ORAL_TABLET | ORAL | Status: DC
Start: ? — End: 2018-04-12

## 2018-04-12 MED ORDER — HYDROXYZINE HCL 25 MG PO TABS
12.50 | ORAL_TABLET | ORAL | Status: DC
Start: ? — End: 2018-04-12

## 2018-04-12 MED ORDER — PREDNISONE 5 MG PO TABS
5.00 | ORAL_TABLET | ORAL | Status: DC
Start: 2018-04-13 — End: 2018-04-12

## 2018-04-12 MED ORDER — FAMOTIDINE 20 MG PO TABS
40.00 | ORAL_TABLET | ORAL | Status: DC
Start: 2018-04-12 — End: 2018-04-12

## 2018-04-12 MED ORDER — SIMETHICONE 80 MG PO CHEW
80.00 | CHEWABLE_TABLET | ORAL | Status: DC
Start: 2018-04-12 — End: 2018-04-12

## 2018-04-12 MED ORDER — ZOLPIDEM TARTRATE 5 MG PO TABS
2.50 | ORAL_TABLET | ORAL | Status: DC
Start: ? — End: 2018-04-12

## 2018-04-12 MED ORDER — GENERIC EXTERNAL MEDICATION
500.00 | Status: DC
Start: ? — End: 2018-04-12

## 2018-04-15 ENCOUNTER — Other Ambulatory Visit: Payer: Self-pay

## 2018-04-15 ENCOUNTER — Other Ambulatory Visit (HOSPITAL_COMMUNITY): Payer: Self-pay | Admitting: General Practice

## 2018-04-15 DIAGNOSIS — K5090 Crohn's disease, unspecified, without complications: Secondary | ICD-10-CM

## 2018-04-16 ENCOUNTER — Other Ambulatory Visit (HOSPITAL_COMMUNITY): Payer: Self-pay | Admitting: General Practice

## 2018-04-19 DIAGNOSIS — Z7901 Long term (current) use of anticoagulants: Secondary | ICD-10-CM | POA: Diagnosis not present

## 2018-04-19 DIAGNOSIS — Z886 Allergy status to analgesic agent status: Secondary | ICD-10-CM | POA: Diagnosis not present

## 2018-04-19 DIAGNOSIS — E860 Dehydration: Secondary | ICD-10-CM | POA: Diagnosis not present

## 2018-04-19 DIAGNOSIS — R51 Headache: Secondary | ICD-10-CM | POA: Diagnosis not present

## 2018-04-19 DIAGNOSIS — Z885 Allergy status to narcotic agent status: Secondary | ICD-10-CM | POA: Diagnosis not present

## 2018-04-19 DIAGNOSIS — Z853 Personal history of malignant neoplasm of breast: Secondary | ICD-10-CM | POA: Diagnosis not present

## 2018-04-19 DIAGNOSIS — Z9049 Acquired absence of other specified parts of digestive tract: Secondary | ICD-10-CM | POA: Diagnosis not present

## 2018-04-19 DIAGNOSIS — Z882 Allergy status to sulfonamides status: Secondary | ICD-10-CM | POA: Diagnosis not present

## 2018-04-19 DIAGNOSIS — Z7952 Long term (current) use of systemic steroids: Secondary | ICD-10-CM | POA: Diagnosis not present

## 2018-04-22 MED ORDER — SALINE NASAL SPRAY 0.65 % NA SOLN
1.00 | NASAL | Status: DC
Start: ? — End: 2018-04-22

## 2018-04-22 MED ORDER — ENOXAPARIN SODIUM 40 MG/0.4ML ~~LOC~~ SOLN
40.00 | SUBCUTANEOUS | Status: DC
Start: 2018-04-23 — End: 2018-04-22

## 2018-04-22 MED ORDER — ONDANSETRON HCL 4 MG/2ML IJ SOLN
4.00 | INTRAMUSCULAR | Status: DC
Start: ? — End: 2018-04-22

## 2018-04-22 MED ORDER — ACETAMINOPHEN 325 MG PO TABS
650.00 | ORAL_TABLET | ORAL | Status: DC
Start: ? — End: 2018-04-22

## 2018-04-22 MED ORDER — FAMOTIDINE 20 MG PO TABS
40.00 | ORAL_TABLET | ORAL | Status: DC
Start: 2018-04-22 — End: 2018-04-22

## 2018-04-22 MED ORDER — LOPERAMIDE HCL 2 MG PO CAPS
2.00 | ORAL_CAPSULE | ORAL | Status: DC
Start: 2018-04-22 — End: 2018-04-22

## 2018-04-22 MED ORDER — PREDNISONE 5 MG PO TABS
5.00 | ORAL_TABLET | ORAL | Status: DC
Start: 2018-04-23 — End: 2018-04-22

## 2018-04-22 MED ORDER — CETIRIZINE HCL 10 MG PO TABS
10.00 | ORAL_TABLET | ORAL | Status: DC
Start: 2018-04-23 — End: 2018-04-22

## 2018-04-22 MED ORDER — FLUOXETINE HCL 20 MG PO CAPS
20.00 | ORAL_CAPSULE | ORAL | Status: DC
Start: 2018-04-22 — End: 2018-04-22

## 2018-04-22 MED ORDER — ZOLPIDEM TARTRATE 5 MG PO TABS
5.00 | ORAL_TABLET | ORAL | Status: DC
Start: ? — End: 2018-04-22

## 2018-04-23 ENCOUNTER — Telehealth: Payer: Self-pay | Admitting: Internal Medicine

## 2018-04-23 NOTE — Telephone Encounter (Signed)
Jazmine from [place] called about the pat assistance program and needing to speak with someone the matter.

## 2018-04-23 NOTE — Telephone Encounter (Signed)
Pt has been approved for Entyvio for the year 2020. Call the phone number [phone] to order the Entyvio.

## 2018-04-23 NOTE — Telephone Encounter (Signed)
Financial hardship statement given over the phone for the pt.

## 2018-04-23 NOTE — Telephone Encounter (Signed)
[place] from [place] called needing a statement of financial hardship for the pt. She stated that you can call back at [phone] and ref.# [id number]

## 2018-04-27 ENCOUNTER — Telehealth: Payer: Self-pay | Admitting: Internal Medicine

## 2018-04-27 NOTE — Telephone Encounter (Signed)
Pt would like to speak with you regarding her entyvio appt at the hospital.

## 2018-04-27 NOTE — Telephone Encounter (Signed)
Pt is scheduled for entyvio on 04/29/18. States she will keep this appt but wonders why she needs to continue with this drug as she was told her condition had worsened while taking it. Pt wants to get Dr. Vena Rua thoughts on this, please advise.

## 2018-04-29 ENCOUNTER — Encounter: Payer: Self-pay | Admitting: *Deleted

## 2018-04-29 ENCOUNTER — Encounter (HOSPITAL_COMMUNITY): Payer: Medicare HMO

## 2018-04-29 NOTE — Telephone Encounter (Signed)
Pt scheduled to see Dr. Hilarie Fredrickson 05/05/18@2 :15pm. Pt aware of appt.

## 2018-04-29 NOTE — Telephone Encounter (Signed)
Definitely needs to continue Entyvio and office follow up with me

## 2018-04-29 NOTE — Telephone Encounter (Signed)
Pt called and cancelled the entyvio, states Dr. Drue Flirt told her not to have the infusion at her last admission.

## 2018-05-03 DIAGNOSIS — K5090 Crohn's disease, unspecified, without complications: Secondary | ICD-10-CM | POA: Diagnosis not present

## 2018-05-03 DIAGNOSIS — Z932 Ileostomy status: Secondary | ICD-10-CM | POA: Diagnosis not present

## 2018-05-05 ENCOUNTER — Encounter: Payer: Self-pay | Admitting: Internal Medicine

## 2018-05-05 ENCOUNTER — Other Ambulatory Visit (INDEPENDENT_AMBULATORY_CARE_PROVIDER_SITE_OTHER): Payer: Medicare HMO

## 2018-05-05 ENCOUNTER — Ambulatory Visit: Payer: Medicare HMO | Admitting: Internal Medicine

## 2018-05-05 VITALS — BP 142/90 | HR 76 | Ht 66.0 in | Wt 154.1 lb

## 2018-05-05 DIAGNOSIS — K50111 Crohn's disease of large intestine with rectal bleeding: Secondary | ICD-10-CM

## 2018-05-05 DIAGNOSIS — R110 Nausea: Secondary | ICD-10-CM | POA: Diagnosis not present

## 2018-05-05 DIAGNOSIS — Z7952 Long term (current) use of systemic steroids: Secondary | ICD-10-CM | POA: Diagnosis not present

## 2018-05-05 DIAGNOSIS — Z932 Ileostomy status: Secondary | ICD-10-CM | POA: Diagnosis not present

## 2018-05-05 DIAGNOSIS — K5090 Crohn's disease, unspecified, without complications: Secondary | ICD-10-CM

## 2018-05-05 DIAGNOSIS — K219 Gastro-esophageal reflux disease without esophagitis: Secondary | ICD-10-CM | POA: Diagnosis not present

## 2018-05-05 DIAGNOSIS — E538 Deficiency of other specified B group vitamins: Secondary | ICD-10-CM | POA: Diagnosis not present

## 2018-05-05 DIAGNOSIS — R1013 Epigastric pain: Secondary | ICD-10-CM

## 2018-05-05 LAB — CBC WITH DIFFERENTIAL/PLATELET
Basophils Absolute: 0.2 10*3/uL — ABNORMAL HIGH (ref 0.0–0.1)
Basophils Relative: 1.1 % (ref 0.0–3.0)
Eosinophils Absolute: 0.1 10*3/uL (ref 0.0–0.7)
Eosinophils Relative: 0.5 % (ref 0.0–5.0)
HCT: 43.9 % (ref 36.0–46.0)
Hemoglobin: 14.5 g/dL (ref 12.0–15.0)
Lymphocytes Relative: 17.5 % (ref 12.0–46.0)
Lymphs Abs: 2.5 10*3/uL (ref 0.7–4.0)
MCHC: 33.1 g/dL (ref 30.0–36.0)
MCV: 93.4 fl (ref 78.0–100.0)
MONOS PCT: 4 % (ref 3.0–12.0)
Monocytes Absolute: 0.6 10*3/uL (ref 0.1–1.0)
Neutro Abs: 11 10*3/uL — ABNORMAL HIGH (ref 1.4–7.7)
Neutrophils Relative %: 76.9 % (ref 43.0–77.0)
Platelets: 323 10*3/uL (ref 150.0–400.0)
RBC: 4.7 Mil/uL (ref 3.87–5.11)
RDW: 12.7 % (ref 11.5–15.5)
WBC: 14.3 10*3/uL — ABNORMAL HIGH (ref 4.0–10.5)

## 2018-05-05 LAB — COMPREHENSIVE METABOLIC PANEL
ALT: 27 U/L (ref 0–35)
AST: 27 U/L (ref 0–37)
Albumin: 4.4 g/dL (ref 3.5–5.2)
Alkaline Phosphatase: 125 U/L — ABNORMAL HIGH (ref 39–117)
BUN: 14 mg/dL (ref 6–23)
CO2: 19 mEq/L (ref 19–32)
Calcium: 9.8 mg/dL (ref 8.4–10.5)
Chloride: 103 mEq/L (ref 96–112)
Creatinine, Ser: 0.99 mg/dL (ref 0.40–1.20)
GFR: 57.88 mL/min — AB (ref >60.00)
GLUCOSE: 118 mg/dL — AB (ref 70–99)
Potassium: 4.7 mEq/L (ref 3.5–5.1)
Sodium: 131 mEq/L — ABNORMAL LOW (ref 135–145)
Total Bilirubin: 0.5 mg/dL (ref 0.2–1.2)
Total Protein: 7.4 g/dL (ref 6.0–8.3)

## 2018-05-05 MED ORDER — ONDANSETRON 4 MG PO TBDP: 4.0000 mg | ORAL_TABLET | Freq: Four times a day (QID) | ORAL | 1 refills | Status: DC | PRN

## 2018-05-05 NOTE — Patient Instructions (Addendum)
If you are age 77 or older, your body mass index should be between 23-30. Your Body mass index is 24.88 kg/m. If this is out of the aforementioned range listed, please consider follow up with your Primary Care Provider.  If you are age 92 or younger, your body mass index should be between 19-25. Your Body mass index is 24.88 kg/m. If this is out of the aformentioned range listed, please consider follow up with your Primary Care Provider.   Resume prednisone 62m once daily for 2 weeks, then 142monce daily thereafter.   Resume Entyvio.   We have sent the following medications to your pharmacy for you to pick up at your convenience: [facility] has requested that you go to the basement level for lab work before leaving today. Press "B" on the elevator. The lab is located at the first door on the left as you exit the elevator.  You have been scheduled for a 6 week follow up with Dr. PyHilarie Fredricksonn 06/17/18 @ 2:00pm.

## 2018-05-06 ENCOUNTER — Encounter: Payer: Self-pay | Admitting: Internal Medicine

## 2018-05-06 ENCOUNTER — Other Ambulatory Visit: Payer: Self-pay

## 2018-05-06 ENCOUNTER — Telehealth: Payer: Self-pay | Admitting: Internal Medicine

## 2018-05-06 DIAGNOSIS — K5090 Crohn's disease, unspecified, without complications: Secondary | ICD-10-CM

## 2018-05-06 MED ORDER — PREDNISONE 10 MG PO TABS: 10.0000 mg | ORAL_TABLET | ORAL | 0 refills | Status: DC

## 2018-05-06 NOTE — Telephone Encounter (Signed)
Sara Barnes from [place] would like a CB to discuss Entyvio infusion.

## 2018-05-06 NOTE — Progress Notes (Signed)
Subjective:    Patient ID: Sara Barnes, female    DOB: August 13, 1940, 78 y.o.   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  She is here today with her Sara Barnes.  She was last seen at the time of surveillance endoscopy on 03/17/2018.  Subsequent to this visit she met with Dr. Drue Flirt at [facility]  Sara Barnes has very very much wanted to have her ostomy reversed.  The colonoscopy that I performed on 03/17/2018 was aborted due to fragility of the mucosa and concern for perforation in the setting of longstanding IBD with significant scarring.  This exam showed inflammation, friability and scarring and continuous and circumferential pattern from the rectum to sigmoid.  This was mild in severity and better compared to prior examination.  Biopsies were performed.  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  This stenosis was dilated manually at rectal exam.  Pathology from her colon biopsies showed chronic active colitis with ulceration.  She had been maintained on Entyvio therapy since June 2019 however her last dose was 03/04/2018.  On 04/05/2018 she went to the operating room with Dr. Drue Flirt for planned ileostomy takedown.  She was not fully reversed however had end ileostomy takedown with handsewn anastomosis and creation of a diverting loop ileostomy.  She was in the hospital for 1 week.  She was then readmitted on the 23rd through 26 December for dehydration.  She reports that she woke up disappointed and that she still had an ostomy in place.  Today she was confused and did not understand that her ostomy is now a diverting loop rather than an end ileostomy.  We discussed this at  length today.  She is having normal functioning ostomy though hates it.  She is having some stool which is mostly liquid and occasional there is blood with bowel movement.  She is using Lomotil to help control her loose stools per rectum.  She feels worn out and dehydrated.  She is eating and drinking.  She is taking her B12 injections.  Using Pepcid twice a day.  Using Zofran for nausea.  Remains on 5 mg of prednisone at bedtime.  She has been off of narcotic pain medication since 04/19/2018.  She is not sure she is strong enough at present to undergo further surgery for loop ileostomy takedown/reversal.  That said she still interested in having that done when she is physically ready.   Review of Systems  As per HPI, otherwise negative  Current Medications, Allergies, Past Medical History, Past Surgical History, Family History and Social History were reviewed in [employer] record.      Objective:   Physical Exam BP (!) 142/90   Pulse 76   Ht _0  (1.676 m)   Wt 154 lb 2 oz (69.9 kg)   BMI 24.88 kg/m  Constitutional: Well-developed and well-nourished. No distress. HEENT: Normocephalic and atraumatic.  Conjunctivae are normal.  No scleral icterus. Neck: Neck supple. Trachea midline. Cardiovascular: Normal rate, regular rhythm and intact distal pulses.  Pulmonary/chest: Effort normal and breath sounds normal. No wheezing, rales or rhonchi. Abdominal: Soft, ileostomy in place in the right lower quadrant draining greenish-brown effluent.  nontender, nondistended. Bowel sounds active throughout.  There are no masses palpable. No hepatosplenomegaly. Extremities: no clubbing, cyanosis, or edema Neurological: Alert and oriented to person place and time. Skin: Skin is warm and dry. Psychiatric: Normal mood and affect. Behavior is normal.  CMP     Component Value Date/Time   NA 131 (L) 05/05/2018 1540   NA 138 12/06/2014 0804   K 4.7 05/05/2018 1540   K 4.1  12/06/2014 0804   CL 103 05/05/2018 1540   CO2 19 05/05/2018 1540   CO2 25 12/06/2014 0804   GLUCOSE 118 (H) 05/05/2018 1540   GLUCOSE 92 12/06/2014 0804   BUN 14 05/05/2018 1540   BUN 12.3 12/06/2014 0804   CREATININE 0.99 05/05/2018 1540   CREATININE 0.8 12/06/2014 0804   CALCIUM 9.8 05/05/2018 1540   CALCIUM 9.4 12/06/2014 0804   PROT 7.4 05/05/2018 1540   PROT 6.5 12/06/2014 0804   ALBUMIN 4.4 05/05/2018 1540   ALBUMIN 3.3 (L) 12/06/2014 0804   AST 27 05/05/2018 1540   AST 9 12/06/2014 0804   ALT 27 05/05/2018 1540   ALT 10 12/06/2014 0804   ALKPHOS 125 (H) 05/05/2018 1540   ALKPHOS 89 12/06/2014 0804   BILITOT 0.5 05/05/2018 1540   BILITOT 0.37 12/06/2014 0804   GFRNONAA >60 06/09/2017 0348   GFRAA >60 06/09/2017 0348   CBC    Component Value Date/Time   WBC 14.3 (H) 05/05/2018 1540   RBC 4.70 05/05/2018 1540   HGB 14.5 05/05/2018 1540   HGB 13.7 12/06/2014 0804   HCT 43.9 05/05/2018 1540   HCT 41.1 12/06/2014 0804   PLT 323.0 05/05/2018 1540   PLT 303 12/06/2014 0804   MCV 93.4 05/05/2018 1540   MCV 95.8 12/06/2014 0804   MCH 32.9 06/09/2017 0348   MCHC 33.1 05/05/2018 1540   RDW 12.7 05/05/2018 1540   RDW 12.2 12/06/2014 0804   LYMPHSABS 2.5 05/05/2018 1540   LYMPHSABS 1.8 12/06/2014 0804   MONOABS 0.6 05/05/2018 1540   MONOABS 0.8 12/06/2014 0804   EOSABS 0.1 05/05/2018 1540   EOSABS 0.2 12/06/2014 0804   BASOSABS 0.2 (H) 05/05/2018 1540   BASOSABS 0.0 12/06/2014 0804       Assessment & Plan:  78 year old female with a past medical history of Crohn's colitis complicated by perianal disease requiring end ileostomy and subsequent revision for ostomy stenosis, history of cecal perforation status post right hemicolectomy in November 2018, GERD with dyspepsia, insomnia, depression who is here for follow-up.   1.  Crohn's colitis with perianal involvement/history of perianal fistulas/diverting loop ileostomy --I would like to get her back on Entyvio therapy  as this is the main treatment for her active colitis.  She is on very low-dose prednisone.  I discussed this with Dr. Drue Flirt who is okay resuming Biologics at this time.  I remain skeptical that taking down her loop ileostomy will result in improvement in her colitis and we have discussed how it may worsen her colitis.  That said she feels very very strongly about having the ostomy taken down. --Resume Entyvio every 8 weeks; schedule infusion as soon as possible --Crease prednisone to 15 mg x 2 weeks then 10 mg daily thereafter --Okay for Lomotil 1 to 2 tablets 4 times daily as needed loose stools --Can use Zofran as needed for nausea per prescription instruction --Office follow-up in 6 weeks --She will see Dr. Drue Flirt on 05/14/2018  2.  B12 deficiency --continue monthly IM B12  3.  GERD/dyspepsia --continue famotidine 20 to 40 mg every 12  hours

## 2018-05-06 NOTE — Telephone Encounter (Signed)
I spoke with Sara Barnes.  Patient okay to resume Entyvio per Dr. Hilarie Fredrickson.  New orders placed.  I left a detailed message for the patient to call short stay and schedule her next infusion.

## 2018-05-06 NOTE — Telephone Encounter (Signed)
Rx sent 

## 2018-05-14 ENCOUNTER — Other Ambulatory Visit: Payer: Self-pay | Admitting: Internal Medicine

## 2018-05-14 DIAGNOSIS — K50113 Crohn's disease of large intestine with fistula: Secondary | ICD-10-CM

## 2018-06-11 ENCOUNTER — Encounter: Payer: Medicare HMO | Admitting: Family Medicine

## 2018-06-17 ENCOUNTER — Ambulatory Visit: Payer: Medicare HMO | Admitting: Internal Medicine

## 2018-06-22 DIAGNOSIS — K50114 Crohn's disease of large intestine with abscess: Secondary | ICD-10-CM | POA: Diagnosis not present

## 2018-06-22 DIAGNOSIS — Z932 Ileostomy status: Secondary | ICD-10-CM | POA: Diagnosis not present

## 2018-06-24 DIAGNOSIS — K5090 Crohn's disease, unspecified, without complications: Secondary | ICD-10-CM | POA: Diagnosis not present

## 2018-06-24 DIAGNOSIS — Z932 Ileostomy status: Secondary | ICD-10-CM | POA: Diagnosis not present

## 2018-07-16 ENCOUNTER — Telehealth: Payer: Self-pay | Admitting: Internal Medicine

## 2018-07-16 NOTE — Telephone Encounter (Signed)
Pt requested a CB to discuss upcoming appt.  She stated that she does not wish to cancel.

## 2018-07-16 NOTE — Telephone Encounter (Signed)
Pt is scheduled for OV on Tuesday but does not want to come out for the visit due to Covid-19. Pt does not want to cancel the appt and would like to have a phone visit. Discussed with pt that we will not cancel her appt at this time. Dr. Hilarie Fredrickson notified.

## 2018-07-20 ENCOUNTER — Other Ambulatory Visit: Payer: Self-pay

## 2018-07-20 ENCOUNTER — Telehealth (INDEPENDENT_AMBULATORY_CARE_PROVIDER_SITE_OTHER): Payer: Medicare HMO | Admitting: Internal Medicine

## 2018-07-20 DIAGNOSIS — K50111 Crohn's disease of large intestine with rectal bleeding: Secondary | ICD-10-CM

## 2018-07-20 DIAGNOSIS — Z932 Ileostomy status: Secondary | ICD-10-CM | POA: Diagnosis not present

## 2018-07-20 DIAGNOSIS — R1310 Dysphagia, unspecified: Secondary | ICD-10-CM | POA: Diagnosis not present

## 2018-07-20 DIAGNOSIS — K5090 Crohn's disease, unspecified, without complications: Secondary | ICD-10-CM | POA: Diagnosis not present

## 2018-07-20 DIAGNOSIS — G4701 Insomnia due to medical condition: Secondary | ICD-10-CM

## 2018-07-20 DIAGNOSIS — K219 Gastro-esophageal reflux disease without esophagitis: Secondary | ICD-10-CM | POA: Diagnosis not present

## 2018-07-20 NOTE — Progress Notes (Signed)
Subjective:    Patient ID: Sara Barnes, female    DOB: [birth year], 78 y.o.   MRN: [id number]  Tele-visit due to COVID-19 pandemic Patient requested visit virtually, consented to the visit/call Location of patient: Home Location provider: My medical office Names of persons participating: Me, patient and Quintin Alto, CMA Time spent on call: 37 minutes   HPI Sara Barnes is a 78 yo female with complicated hx with Crohn's colitis with perianal disease, creation of an end ileostomy and subsequent revision for ostomy stenosis, now with a diverting loop ileostomy, prior right hemicolectomy after cecal perforation during flexible sigmoidoscopy in November 2018, GERD with dyspepsia, insomnia, depression here for follow-up though being seen virtually due to COVID-19 pandemic.  Got so sick with barium study (took valium beforehand).  Had vomiting on the examination table.  Plan with Dr. Drue Flirt was surgery date set for 07/26/2018.  Patient postponed due to COVID, and set for September 27, 2018 to take down the loop ileostomy.    Prednisone down to 5 mg daily.  Had been on 10 mg daily.  Entyvio, despite my intention to have this resumed, was not.  She thought we would do this after surgery.  Right now, she does not want to resume biologic medication, until after the surgery actually happens.  She is also worried about the implications and if surgery is put off, then she will be agreeable to start it back.  Using imodium.  Having some choking with pills.  Trouble with swallowing anything, seems to be most bothersome after most recent surgery.  Using Pepcid 40 mg was BID, now once per day but wonders if this makes bowel effluent more watery.   Nausea is better now.  Very little pain in the abdomen. Less bleeding than before still some in the mornings.  Far, far better than every been.  Things are pretty much the same.  Biggest issues are weakness and dehydration.  Skin has "aged 56 years" and she things is  due to hydration.    Review of Systems As per HPI, otherwise negative  Current Medications, Allergies, Past Medical History, Past Surgical History, Family History and Social History were reviewed in [employer] record.     Objective:   Physical Exam No exam; virtual visit  Barium enema -- ordered by Dr. Drue Flirt FINDINGS:  .  KUB: Unremarkable bowel gas pattern, without evidence of obstruction or ileus. No abnormal soft tissue density or pathologic calcification is apparent. Ileostomy projects over the upper quadrant. . Large bowel: Postsurgical changes of right colectomy with ileocolic anastomosis. Colon was abnormal with absence of haustra and diffuse narrowing including the rectum. Several parts of the colon measure less than 1 cm with a maximum diameter of 2 cm. The rectum measures 1.8 cm in diameter. The anastomotic site appears intact without leak. Multiple diverticuli throughout the small bowel. Contrast was seen at the ostomy site. No evidence of stricture. Marland Kitchen  Post-evacuation image: Unremarkable. .  Additional comments: None.  CONCLUSION:  1.  Postsurgical changes of right colectomy with ileocolic anastomosis. The remaining large bowel and rectum is ahaustral and diffusely narrow, likely related to history of Crohn's disease. No evidence of leak or obstruction. 2.  Multiple small bowel diverticula.      Assessment & Plan:  78 yo female with complicated hx with Crohn's colitis with perianal disease, creation of an end ileostomy and subsequent revision for ostomy stenosis, now with a diverting loop ileostomy, prior right hemicolectomy after  cecal perforation during flexible sigmoidoscopy in November 2018, GERD with dyspepsia, insomnia, depression here for follow-up though being seen virtually due to COVID-19 pandemic.  1. Crohn's colitis with perianal Crohn's/loop ileostomy in place --unfortunately Entyvio was not resumed despite my recommendation.  Dr.  Drue Flirt was also okay with this.  At this point given that she has ileostomy reversal scheduled for 09/27/2018 she prefers to not start Entyvio until after surgery.  I have explained that this may make surgery and postop recovery more difficult if colitis is less controlled at the time of surgery.  She seems to understand this.  She is agreeable to consideration of resuming Entyvio after reversal --For now continue prednisone 5 mg daily --Resume Entyvio when patient agreeable --She will follow-up with Dr. Drue Flirt as scheduled.  I reviewed her barium enema which showed postsurgical changes of right hemicolectomy with ahaustral colonic with diffuse narrowing due to history of chronic colitis.  2. Dysphagia + odynophagia --warrants further evaluation.  Empiric fluconazole for possible Candida given immunosuppression with prednisone.  Fluconazole 200 mg x 1 day 100 mg x 13 days.  If quick recurrence will lengthen course to 21 days.  If no benefit then barium esophagram when safe for her to have an outpatient radiology study in light of the pandemic and possible endoscopy for dilation if needed based on barium esophagram results  3. Insomnia --okay for Ambien nightly as needed  4. GERD/dyspepsia/nausea --continue Pepcid 20 mg twice daily.  Nausea has improved  Over 40 minutes spent today. Greater than 50% was spent in counseling and coordination of care with the patient

## 2018-07-21 ENCOUNTER — Telehealth: Payer: Self-pay | Admitting: Internal Medicine

## 2018-07-21 DIAGNOSIS — K50113 Crohn's disease of large intestine with fistula: Secondary | ICD-10-CM

## 2018-07-21 MED ORDER — ZOLPIDEM TARTRATE 10 MG PO TABS: 5.0000 mg | ORAL_TABLET | Freq: Every evening | ORAL | 2 refills | Status: DC | PRN

## 2018-07-21 MED ORDER — FLUCONAZOLE 100 MG PO TABS: ORAL_TABLET | ORAL | 0 refills | Status: DC

## 2018-07-21 NOTE — Telephone Encounter (Signed)
Rxs sent

## 2018-07-21 NOTE — Patient Instructions (Signed)
We have sent the following medications to your pharmacy for you to pick up at your convenience: Fluconazole 200 mg on day 1, then 100 mg on days 2-15. Ambien  Continue imodium.  Continue Pepcid.  We will schedule your for a barium esophagram with tablet to evaluate your swallowing in 2 months or so. We will be in contact with you regarding this as soon as a schedule has been made available to [place].  If you are age 77 or older, your body mass index should be between 23-30. Your There is no height or weight on file to calculate BMI. If this is out of the aforementioned range listed, please consider follow up with your Primary Care Provider.  If you are age 82 or younger, your body mass index should be between 19-25. Your There is no height or weight on file to calculate BMI. If this is out of the aformentioned range listed, please consider follow up with your Primary Care Provider.

## 2018-07-25 ENCOUNTER — Other Ambulatory Visit: Payer: Self-pay | Admitting: Internal Medicine

## 2018-08-05 ENCOUNTER — Other Ambulatory Visit: Payer: Self-pay | Admitting: Internal Medicine

## 2018-08-05 DIAGNOSIS — K50113 Crohn's disease of large intestine with fistula: Secondary | ICD-10-CM

## 2018-08-24 ENCOUNTER — Other Ambulatory Visit: Payer: Self-pay

## 2018-08-24 ENCOUNTER — Telehealth: Payer: Self-pay | Admitting: Internal Medicine

## 2018-08-24 DIAGNOSIS — R1084 Generalized abdominal pain: Secondary | ICD-10-CM

## 2018-08-24 NOTE — Telephone Encounter (Signed)
Patient called said that she thinks she is having a kidney infection. Said she has pain in her "bladder area". Would like to know what she should do.

## 2018-08-24 NOTE — Telephone Encounter (Signed)
Order for UA with reflex culture in epic. Patient called to come in and give specimen. States she will try to drink a lot of water so she can do this. Right now she is only going dribbles.

## 2018-08-24 NOTE — Telephone Encounter (Signed)
Please check UA and reflex urine culture Thanks

## 2018-08-24 NOTE — Telephone Encounter (Signed)
Patient called stating she has pain in her bladder area and thinks she may have a bladder infection. I asked her to call her PCP, but she insisted Dr Hilarie Fredrickson is the Dr. Parks Ranger saw her last and he takes care of all her stuff. Please advise

## 2018-08-25 ENCOUNTER — Other Ambulatory Visit (INDEPENDENT_AMBULATORY_CARE_PROVIDER_SITE_OTHER): Payer: Medicare HMO

## 2018-08-25 DIAGNOSIS — R1084 Generalized abdominal pain: Secondary | ICD-10-CM

## 2018-08-25 LAB — URINALYSIS, ROUTINE W REFLEX MICROSCOPIC
Bilirubin Urine: NEGATIVE
Nitrite: POSITIVE — AB
Specific Gravity, Urine: 1.025 (ref 1.000–1.030)
Total Protein, Urine: >=300 — AB
Urine Glucose: 250 — AB
Urobilinogen, UA: >=8 — AB (ref 0.0–1.0)
pH: 6.5 (ref 5.0–8.0)

## 2018-08-26 ENCOUNTER — Other Ambulatory Visit: Payer: Self-pay | Admitting: *Deleted

## 2018-08-26 ENCOUNTER — Telehealth: Payer: Self-pay | Admitting: Internal Medicine

## 2018-08-26 MED ORDER — CIPROFLOXACIN HCL 500 MG PO TABS: 500.0000 mg | ORAL_TABLET | Freq: Two times a day (BID) | ORAL | 0 refills | Status: AC

## 2018-08-26 NOTE — Telephone Encounter (Signed)
See results notes for details.

## 2018-08-26 NOTE — Telephone Encounter (Signed)
See result note.  

## 2018-08-26 NOTE — Telephone Encounter (Signed)
Dr. Hilarie Fredrickson please review her UA and advise

## 2018-09-03 ENCOUNTER — Telehealth: Payer: Self-pay | Admitting: Internal Medicine

## 2018-09-03 DIAGNOSIS — K50113 Crohn's disease of large intestine with fistula: Secondary | ICD-10-CM

## 2018-09-03 NOTE — Telephone Encounter (Signed)
Left a message to discuss with patient. Advised Pyrtle wasn't here today and would be addressed on Monday 09-06-2018.

## 2018-09-06 ENCOUNTER — Encounter: Payer: Self-pay | Admitting: *Deleted

## 2018-09-06 MED ORDER — ZOLPIDEM TARTRATE 10 MG PO TABS: 10.0000 mg | ORAL_TABLET | Freq: Every evening | ORAL | 0 refills | Status: DC | PRN

## 2018-09-06 NOTE — Telephone Encounter (Signed)
Would recommend a virtual visit   Ok for ambien 10 mg qHS PRN

## 2018-09-06 NOTE — Telephone Encounter (Signed)
Rx sent for Ambien 10 mg 1 tablet po qhs prn.   Patient scheduled for telephone visit on 09/09/18 at 10:30 am. She verbalizes understanding.

## 2018-09-09 ENCOUNTER — Encounter: Payer: Self-pay | Admitting: Internal Medicine

## 2018-09-09 ENCOUNTER — Other Ambulatory Visit: Payer: Self-pay

## 2018-09-09 ENCOUNTER — Ambulatory Visit (INDEPENDENT_AMBULATORY_CARE_PROVIDER_SITE_OTHER): Payer: Medicare HMO | Admitting: Internal Medicine

## 2018-09-09 VITALS — Ht 66.0 in | Wt 150.0 lb

## 2018-09-09 DIAGNOSIS — K50111 Crohn's disease of large intestine with rectal bleeding: Secondary | ICD-10-CM

## 2018-09-09 DIAGNOSIS — K219 Gastro-esophageal reflux disease without esophagitis: Secondary | ICD-10-CM | POA: Diagnosis not present

## 2018-09-09 DIAGNOSIS — G4701 Insomnia due to medical condition: Secondary | ICD-10-CM

## 2018-09-09 DIAGNOSIS — R5383 Other fatigue: Secondary | ICD-10-CM | POA: Diagnosis not present

## 2018-09-09 DIAGNOSIS — Z932 Ileostomy status: Secondary | ICD-10-CM | POA: Diagnosis not present

## 2018-09-09 NOTE — Patient Instructions (Signed)
Continue prednisone 5 mg daily.  Resume Entyvio as soon as possible after surgery; if you delay surgery, I recommend we resume Entyvio now.    If you do proceed with surgery, I would like to see you immediately after the first postoperative visit with Dr. Drue Flirt.  Continue Ambien 5 to 10 mg nightly.  Continue Pepcid 20 mg twice daily.

## 2018-09-09 NOTE — Progress Notes (Signed)
Subjective:    Patient ID: Sara Barnes, female    DOB: [birth year], 78 y.o.   MRN: [id number]  This service was provided via telemedicine.  Telephone encounter The patient was located at home The provider was located in provider's GI office. The patient did consent to this telephone visit and is aware of possible charges through their insurance for this visit.   The persons participating in this telemedicine service were the patient and I. Time spent on call: 32 min   HPI Sara Barnes is a 78 year old female with a history of complicated Crohn's colitis with perianal disease, prior creation of an end ileostomy and subsequent revision for ostomy stenosis now with a diverting loop ileostomy, prior right hemicolectomy after cecal perforation during flexible sigmoidoscopy in November 2018, GERD with dyspepsia, insomnia, depression who is seen for follow-up.  She is seen virtually today in the setting of the COVID-19 pandemic.  She was last seen in a telemedicine visit on 07/20/2018.  She reports that she has been doing okay.  She remains with very low energy levels and fatigue.  That said she is able to get outside occasionally to work in her garden.  She has been socially distancing.  She was having urinary symptoms including hesitancy and inability to completely empty her bladder.  We tested her urine and diagnosed a UTI on 08/25/2018.  She was treated with Cipro and symptoms have improved.  From her colitis standpoint she remains on prednisone 5 mg.  I had recommended she resume Entyvio but she never did as she was waiting on surgery.  She is scheduled for preop tomorrow and to have her ileostomy reversed on 09/27/2018 with Dr. Drue Flirt.  For quite some time she has been very interested in having this reversed even with the knowledge that her colitis has not been well controlled and may worsen after reversal.  She is now doubting whether she should proceed because she dreads another surgery, the pain  associated with surgery and also wonders if there is a specific high risk due to COVID-19.  She has continued her prednisone 5 mg daily.  She continues to have insomnia but is using Ambien nightly.  Without this she will not sleep.  Her son does not want her to proceed with surgery at this time.  Her Sister Vaughan Basta who remains actively involved on a daily basis has not really weighed and but [name] thinks that Vaughan Basta probably wants her to wait as well.  Her colon was last surveyed in November 2019.  There was friability and narrowing along a featureless colon with inflammation.  Flexible sigmoidoscopy was performed due to the inflammation.  There is an anal stenosis.  Biopsies showed chronic and active colitis   Review of Systems As per HPI, otherwise negative  Current Medications, Allergies, Past Medical History, Past Surgical History, Family History and Social History were reviewed in [employer] record.     Objective:   Physical Exam No PE, virtual visit  CBC    Component Value Date/Time   WBC 14.3 (H) 05/05/2018 1540   RBC 4.70 05/05/2018 1540   HGB 14.5 05/05/2018 1540   HGB 13.7 12/06/2014 0804   HCT 43.9 05/05/2018 1540   HCT 41.1 12/06/2014 0804   PLT 323.0 05/05/2018 1540   PLT 303 12/06/2014 0804   MCV 93.4 05/05/2018 1540   MCV 95.8 12/06/2014 0804   MCH 32.9 06/09/2017 0348   MCHC 33.1 05/05/2018 1540   RDW 12.7 05/05/2018  1540   RDW 12.2 12/06/2014 0804   LYMPHSABS 2.5 05/05/2018 1540   LYMPHSABS 1.8 12/06/2014 0804   MONOABS 0.6 05/05/2018 1540   MONOABS 0.8 12/06/2014 0804   EOSABS 0.1 05/05/2018 1540   EOSABS 0.2 12/06/2014 0804   BASOSABS 0.2 (H) 05/05/2018 1540   BASOSABS 0.0 12/06/2014 0804   CMP     Component Value Date/Time   NA 131 (L) 05/05/2018 1540   NA 138 12/06/2014 0804   K 4.7 05/05/2018 1540   K 4.1 12/06/2014 0804   CL 103 05/05/2018 1540   CO2 19 05/05/2018 1540   CO2 25 12/06/2014 0804   GLUCOSE 118 (H)  05/05/2018 1540   GLUCOSE 92 12/06/2014 0804   BUN 14 05/05/2018 1540   BUN 12.3 12/06/2014 0804   CREATININE 0.99 05/05/2018 1540   CREATININE 0.8 12/06/2014 0804   CALCIUM 9.8 05/05/2018 1540   CALCIUM 9.4 12/06/2014 0804   PROT 7.4 05/05/2018 1540   PROT 6.5 12/06/2014 0804   ALBUMIN 4.4 05/05/2018 1540   ALBUMIN 3.3 (L) 12/06/2014 0804   AST 27 05/05/2018 1540   AST 9 12/06/2014 0804   ALT 27 05/05/2018 1540   ALT 10 12/06/2014 0804   ALKPHOS 125 (H) 05/05/2018 1540   ALKPHOS 89 12/06/2014 0804   BILITOT 0.5 05/05/2018 1540   BILITOT 0.37 12/06/2014 0804   GFRNONAA >60 06/09/2017 0348   GFRAA >60 06/09/2017 0348        Assessment & Plan:  78 year old female with a history of complicated Crohn's colitis with perianal disease, prior creation of an end ileostomy and subsequent revision for ostomy stenosis now with a diverting loop ileostomy, prior right hemicolectomy after cecal perforation during flexible sigmoidoscopy in November 2018, GERD with dyspepsia, insomnia, depression who is seen for follow-up.  1.  Crohn's colitis with perianal Crohn's/loop ileostomy in place --she is scheduled for takedown of her loop ileostomy on 09/27/2018.  She has remained unwavering that she wants to have this reversed even if proven to fail.  I am very hesitant and reluctant for her to have this reversed as I remember how sick she was before she needed her diverting ileostomy but also worry that lack of good control of her colitis (primarily because she has been hesitant to maintain medical therapy) heading into the surgery that she will not do well afterward.  We have discussed this today.  I do think she is wavering a bit.  For now: --Continue prednisone 5 mg --She will decide whether to proceed with reversing her loop ileostomy with Dr. Drue Flirt --Chales Abrahams as soon as possible after surgery; if she delay surgery I recommend we resume Entyvio now.  She understands this recommendation and will  let me know --If she does proceed with surgery I would like to see her immediately after the first postoperative visit with Dr. Drue Flirt  2.  Insomnia --continue Ambien 5 to 10 mg nightly  3.  GERD/dyspepsia/nausea --Pepcid 20 mg twice daily  4.  Fatigue --she wonders if this is due to prednisone, but more likely this is due to her colitis which we discussed at length.

## 2018-09-13 ENCOUNTER — Other Ambulatory Visit: Payer: Self-pay | Admitting: General Surgery

## 2018-09-13 DIAGNOSIS — Z9889 Other specified postprocedural states: Secondary | ICD-10-CM

## 2018-09-16 NOTE — Telephone Encounter (Signed)
error 

## 2018-09-21 DIAGNOSIS — Z932 Ileostomy status: Secondary | ICD-10-CM | POA: Diagnosis not present

## 2018-09-21 DIAGNOSIS — K50114 Crohn's disease of large intestine with abscess: Secondary | ICD-10-CM | POA: Diagnosis not present

## 2018-09-21 DIAGNOSIS — Z1159 Encounter for screening for other viral diseases: Secondary | ICD-10-CM | POA: Diagnosis not present

## 2018-09-24 DIAGNOSIS — N390 Urinary tract infection, site not specified: Secondary | ICD-10-CM | POA: Diagnosis not present

## 2018-09-24 DIAGNOSIS — Z87898 Personal history of other specified conditions: Secondary | ICD-10-CM | POA: Diagnosis not present

## 2018-09-24 DIAGNOSIS — K50114 Crohn's disease of large intestine with abscess: Secondary | ICD-10-CM | POA: Diagnosis not present

## 2018-09-24 DIAGNOSIS — K5080 Crohn's disease of both small and large intestine without complications: Secondary | ICD-10-CM | POA: Diagnosis not present

## 2018-09-24 DIAGNOSIS — Z932 Ileostomy status: Secondary | ICD-10-CM | POA: Diagnosis not present

## 2018-09-24 DIAGNOSIS — R918 Other nonspecific abnormal finding of lung field: Secondary | ICD-10-CM | POA: Diagnosis not present

## 2018-09-24 DIAGNOSIS — Z9889 Other specified postprocedural states: Secondary | ICD-10-CM | POA: Diagnosis not present

## 2018-09-24 DIAGNOSIS — Z90710 Acquired absence of both cervix and uterus: Secondary | ICD-10-CM | POA: Diagnosis not present

## 2018-09-24 DIAGNOSIS — K529 Noninfective gastroenteritis and colitis, unspecified: Secondary | ICD-10-CM | POA: Diagnosis not present

## 2018-09-24 DIAGNOSIS — E860 Dehydration: Secondary | ICD-10-CM | POA: Diagnosis not present

## 2018-09-24 DIAGNOSIS — Z433 Encounter for attention to colostomy: Secondary | ICD-10-CM | POA: Diagnosis not present

## 2018-09-24 DIAGNOSIS — F329 Major depressive disorder, single episode, unspecified: Secondary | ICD-10-CM | POA: Diagnosis not present

## 2018-09-24 DIAGNOSIS — K660 Peritoneal adhesions (postprocedural) (postinfection): Secondary | ICD-10-CM | POA: Diagnosis not present

## 2018-09-24 DIAGNOSIS — F419 Anxiety disorder, unspecified: Secondary | ICD-10-CM | POA: Diagnosis not present

## 2018-09-24 DIAGNOSIS — H9190 Unspecified hearing loss, unspecified ear: Secondary | ICD-10-CM | POA: Diagnosis not present

## 2018-09-24 DIAGNOSIS — Z432 Encounter for attention to ileostomy: Secondary | ICD-10-CM | POA: Diagnosis not present

## 2018-10-12 MED ORDER — PRO HERBS ENERGY PO TABS
40.00 | ORAL_TABLET | ORAL | Status: DC
Start: ? — End: 2018-10-12

## 2018-10-12 MED ORDER — DEWITTS PAIN RELIEVER 325 MG PO TABS
10.00 | ORAL_TABLET | ORAL | Status: DC
Start: ? — End: 2018-10-12

## 2018-10-12 MED ORDER — ALBA-3 EX
400.00 | CUTANEOUS | Status: DC
Start: 2018-10-12 — End: 2018-10-12

## 2018-10-12 MED ORDER — PAIN PM EXTRA STRENGTH 500-25 MG PO TABS
2.00 | ORAL_TABLET | ORAL | Status: DC
Start: 2018-10-12 — End: 2018-10-12

## 2018-10-12 MED ORDER — RICOLA HERB MT
80.00 | OROMUCOSAL | Status: DC
Start: 2018-10-12 — End: 2018-10-12

## 2018-10-12 MED ORDER — ZINC OXIDE 40 % EX PSTE
PASTE | CUTANEOUS | Status: DC
Start: ? — End: 2018-10-12

## 2018-10-12 MED ORDER — VORICONAZOLE 200 MG IV SOLR
5.00 | INTRAVENOUS | Status: DC
Start: 2018-10-12 — End: 2018-10-12

## 2018-10-12 MED ORDER — QUINERVA 260 MG PO TABS
325.00 | ORAL_TABLET | ORAL | Status: DC
Start: ? — End: 2018-10-12

## 2018-10-12 MED ORDER — CVS EAR DROPS OT
40.00 | OTIC | Status: DC
Start: 2018-10-13 — End: 2018-10-12

## 2018-10-12 MED ORDER — PEDIASURE 1.0 CAL/FIBER PO LIQD
4.00 | ORAL | Status: DC
Start: 2018-10-12 — End: 2018-10-12

## 2018-10-12 MED ORDER — ORTHO-NOVUM 10/11 (28) 35 MCG PO TABS
ORAL_TABLET | ORAL | Status: DC
Start: ? — End: 2018-10-12

## 2018-10-12 MED ORDER — ESTROPLUS PO TABS
1.00 | ORAL_TABLET | ORAL | Status: DC
Start: ? — End: 2018-10-12

## 2018-10-12 MED ORDER — ACTIVE CATH MALE EXTERNAL MISC
4.00 | Status: DC
Start: 2018-10-12 — End: 2018-10-12

## 2018-10-12 MED ORDER — POTASSIUM IODIDE 32.5 (24 I) MG PO TABS
20.00 | ORAL_TABLET | ORAL | Status: DC
Start: 2018-10-13 — End: 2018-10-12

## 2018-10-12 MED ORDER — ONDANSETRON HCL 4 MG/2ML IJ SOLN
4.00 | INTRAMUSCULAR | Status: DC
Start: ? — End: 2018-10-12

## 2018-10-12 MED ORDER — PREGABALIN 75 MG PO CAPS
75.00 | ORAL_CAPSULE | ORAL | Status: DC
Start: 2018-10-12 — End: 2018-10-12

## 2018-10-12 MED ORDER — HYDROXYZINE HCL 25 MG PO TABS
12.50 | ORAL_TABLET | ORAL | Status: DC
Start: ? — End: 2018-10-12

## 2018-10-12 MED ORDER — PREDNISONE 5 MG PO TABS
5.00 | ORAL_TABLET | ORAL | Status: DC
Start: 2018-10-13 — End: 2018-10-12

## 2018-10-12 MED ORDER — DIGEX PO CAPS
500.00 | ORAL_CAPSULE | ORAL | Status: DC
Start: 2018-10-12 — End: 2018-10-12

## 2018-10-12 MED ORDER — CYANOCOBALAMIN 1000 MCG/ML IJ SOLN
1000.00 | INTRAMUSCULAR | Status: DC
Start: 2018-10-29 — End: 2018-10-12

## 2018-10-17 ENCOUNTER — Other Ambulatory Visit: Payer: Self-pay | Admitting: Internal Medicine

## 2018-10-17 DIAGNOSIS — K50113 Crohn's disease of large intestine with fistula: Secondary | ICD-10-CM

## 2018-11-03 ENCOUNTER — Other Ambulatory Visit: Payer: Self-pay | Admitting: Internal Medicine

## 2018-11-03 DIAGNOSIS — K51911 Ulcerative colitis, unspecified with rectal bleeding: Secondary | ICD-10-CM

## 2018-11-03 DIAGNOSIS — K6289 Other specified diseases of anus and rectum: Secondary | ICD-10-CM

## 2018-11-03 DIAGNOSIS — E538 Deficiency of other specified B group vitamins: Secondary | ICD-10-CM

## 2018-11-14 ENCOUNTER — Other Ambulatory Visit: Payer: Self-pay | Admitting: Internal Medicine

## 2018-11-14 DIAGNOSIS — K50113 Crohn's disease of large intestine with fistula: Secondary | ICD-10-CM

## 2018-11-23 DIAGNOSIS — Z48815 Encounter for surgical aftercare following surgery on the digestive system: Secondary | ICD-10-CM | POA: Diagnosis not present

## 2018-11-23 DIAGNOSIS — Z432 Encounter for attention to ileostomy: Secondary | ICD-10-CM | POA: Diagnosis not present

## 2018-11-23 DIAGNOSIS — Z9049 Acquired absence of other specified parts of digestive tract: Secondary | ICD-10-CM | POA: Diagnosis not present

## 2018-11-23 DIAGNOSIS — K5090 Crohn's disease, unspecified, without complications: Secondary | ICD-10-CM | POA: Diagnosis not present

## 2018-11-25 ENCOUNTER — Other Ambulatory Visit: Payer: Self-pay | Admitting: General Surgery

## 2018-12-03 ENCOUNTER — Other Ambulatory Visit: Payer: Self-pay | Admitting: General Surgery

## 2018-12-03 ENCOUNTER — Ambulatory Visit
Admission: RE | Admit: 2018-12-03 | Discharge: 2018-12-03 | Disposition: A | Discharge status: Home / Self Care | Payer: Medicare HMO | Source: Ambulatory Visit | Attending: General Surgery | Admitting: General Surgery

## 2018-12-03 ENCOUNTER — Other Ambulatory Visit: Payer: Self-pay

## 2018-12-03 DIAGNOSIS — Z9889 Other specified postprocedural states: Secondary | ICD-10-CM

## 2018-12-03 DIAGNOSIS — R928 Other abnormal and inconclusive findings on diagnostic imaging of breast: Secondary | ICD-10-CM | POA: Diagnosis not present

## 2018-12-03 DIAGNOSIS — Z853 Personal history of malignant neoplasm of breast: Secondary | ICD-10-CM | POA: Diagnosis not present

## 2018-12-14 ENCOUNTER — Other Ambulatory Visit: Payer: Self-pay | Admitting: Internal Medicine

## 2018-12-14 DIAGNOSIS — K50113 Crohn's disease of large intestine with fistula: Secondary | ICD-10-CM

## 2018-12-29 ENCOUNTER — Other Ambulatory Visit: Payer: Self-pay | Admitting: Internal Medicine

## 2018-12-29 DIAGNOSIS — K6289 Other specified diseases of anus and rectum: Secondary | ICD-10-CM

## 2018-12-29 DIAGNOSIS — E538 Deficiency of other specified B group vitamins: Secondary | ICD-10-CM

## 2018-12-29 DIAGNOSIS — K51911 Ulcerative colitis, unspecified with rectal bleeding: Secondary | ICD-10-CM

## 2019-01-04 ENCOUNTER — Ambulatory Visit: Payer: Medicare HMO | Admitting: Internal Medicine

## 2019-01-11 ENCOUNTER — Ambulatory Visit (INDEPENDENT_AMBULATORY_CARE_PROVIDER_SITE_OTHER): Payer: Medicare HMO | Admitting: Internal Medicine

## 2019-01-11 ENCOUNTER — Other Ambulatory Visit (INDEPENDENT_AMBULATORY_CARE_PROVIDER_SITE_OTHER): Payer: Medicare HMO

## 2019-01-11 ENCOUNTER — Encounter: Payer: Self-pay | Admitting: Internal Medicine

## 2019-01-11 ENCOUNTER — Other Ambulatory Visit: Payer: Self-pay

## 2019-01-11 VITALS — BP 112/60 | HR 95 | Temp 97.0°F | Ht 66.0 in | Wt 146.0 lb

## 2019-01-11 DIAGNOSIS — K50919 Crohn's disease, unspecified, with unspecified complications: Secondary | ICD-10-CM | POA: Diagnosis not present

## 2019-01-11 DIAGNOSIS — K50111 Crohn's disease of large intestine with rectal bleeding: Secondary | ICD-10-CM

## 2019-01-11 DIAGNOSIS — R110 Nausea: Secondary | ICD-10-CM | POA: Diagnosis not present

## 2019-01-11 DIAGNOSIS — E538 Deficiency of other specified B group vitamins: Secondary | ICD-10-CM | POA: Diagnosis not present

## 2019-01-11 DIAGNOSIS — K50113 Crohn's disease of large intestine with fistula: Secondary | ICD-10-CM | POA: Diagnosis not present

## 2019-01-11 DIAGNOSIS — G4701 Insomnia due to medical condition: Secondary | ICD-10-CM | POA: Diagnosis not present

## 2019-01-11 LAB — COMPREHENSIVE METABOLIC PANEL
ALT: 7 U/L (ref 0–35)
AST: 12 U/L (ref 0–37)
Albumin: 4 g/dL (ref 3.5–5.2)
Alkaline Phosphatase: 69 U/L (ref 39–117)
BUN: 13 mg/dL (ref 6–23)
CO2: 28 mEq/L (ref 19–32)
Calcium: 9.6 mg/dL (ref 8.4–10.5)
Chloride: 99 mEq/L (ref 96–112)
Creatinine, Ser: 0.88 mg/dL (ref 0.40–1.20)
GFR: 62.28 mL/min (ref >60.00)
Glucose, Bld: 84 mg/dL (ref 70–99)
Potassium: 4 mEq/L (ref 3.5–5.1)
Sodium: 135 mEq/L (ref 135–145)
Total Bilirubin: 0.5 mg/dL (ref 0.2–1.2)
Total Protein: 6.8 g/dL (ref 6.0–8.3)

## 2019-01-11 LAB — IBC + FERRITIN
Ferritin: 28.1 ng/mL (ref 10.0–291.0)
Iron: 66 ug/dL (ref 42–145)
Saturation Ratios: 16.4 % — ABNORMAL LOW (ref 20.0–50.0)
Transferrin: 288 mg/dL (ref 212.0–360.0)

## 2019-01-11 LAB — CBC WITH DIFFERENTIAL/PLATELET
Basophils Absolute: 0.1 10*3/uL (ref 0.0–0.1)
Basophils Relative: 0.9 % (ref 0.0–3.0)
Eosinophils Absolute: 0.1 10*3/uL (ref 0.0–0.7)
Eosinophils Relative: 0.8 % (ref 0.0–5.0)
HCT: 45.9 % (ref 36.0–46.0)
Hemoglobin: 15.3 g/dL — ABNORMAL HIGH (ref 12.0–15.0)
Lymphocytes Relative: 33.8 % (ref 12.0–46.0)
Lymphs Abs: 2.6 10*3/uL (ref 0.7–4.0)
MCHC: 33.4 g/dL (ref 30.0–36.0)
MCV: 88.2 fl (ref 78.0–100.0)
Monocytes Absolute: 0.8 10*3/uL (ref 0.1–1.0)
Monocytes Relative: 10.6 % (ref 3.0–12.0)
Neutro Abs: 4.2 10*3/uL (ref 1.4–7.7)
Neutrophils Relative %: 53.9 % (ref 43.0–77.0)
Platelets: 215 10*3/uL (ref 150.0–400.0)
RBC: 5.2 Mil/uL — ABNORMAL HIGH (ref 3.87–5.11)
RDW: 14.3 % (ref 11.5–15.5)
WBC: 7.8 10*3/uL (ref 4.0–10.5)

## 2019-01-11 LAB — VITAMIN B12: Vitamin B-12: 764 pg/mL (ref 211–911)

## 2019-01-11 MED ORDER — ONDANSETRON 4 MG PO TBDP: 4.0000 mg | ORAL_TABLET | Freq: Four times a day (QID) | ORAL | 0 refills | Status: DC | PRN

## 2019-01-11 MED ORDER — PREDNISONE 5 MG PO TABS: 5.0000 mg | ORAL_TABLET | Freq: Every day | ORAL | 0 refills | Status: DC

## 2019-01-11 MED ORDER — ZOLPIDEM TARTRATE 10 MG PO TABS: 10.0000 mg | ORAL_TABLET | Freq: Every evening | ORAL | 0 refills | Status: DC | PRN

## 2019-01-11 NOTE — Patient Instructions (Addendum)
Continue B12 injections.  Continue Lomotil.  Continue Loperamide.  We will get you set up for Entyvio.  We have sent the following medications to your pharmacy for you to pick up at your convenience: Ambien Prednisone  Zofran  Please follow up with Dr Hilarie Fredrickson in December 2020.  Your provider has requested that you go to the basement level for lab work before leaving today. Press "B" on the elevator. The lab is located at the first door on the left as you exit the elevator.  If you are age 67 or older, your body mass index should be between 23-30. Your Body mass index is 23.57 kg/m. If this is out of the aforementioned range listed, please consider follow up with your Primary Care Provider.  If you are age 51 or younger, your body mass index should be between 19-25. Your Body mass index is 23.57 kg/m. If this is out of the aformentioned range listed, please consider follow up with your Primary Care Provider.

## 2019-01-11 NOTE — Progress Notes (Signed)
Subjective:    Patient ID: Sara Barnes, female    DOB: [birth year], 78 y.o.   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  She is here today in person with her sister Sara Barnes.  She was last seen by telehealth visit on 09/09/2018.  She underwent successful takedown of her loop ileostomy with Dr. Drue Flirt on 09/28/2018.  This recovery was slow as expected and she was hospitalized for 2 weeks.  She reports that she has been using Lomotil 2 tablets 3 times daily and loperamide 4 mg 3 times daily on a regular basis.  Without this she has diarrhea multiple times per day.  She has not seen blood in her stool.  She will occasionally see scant blood with wiping which she attributes to perianal irritation related to loose stool.  She is having 2-3 bowel movements per day which are loose but have more consistency than pure diarrhea.  She has been dealing with some vertigo and dizziness symptoms and she wonders if this relates to Lomotil.  She backed off of this for a while and had less control and more fecal smearing and leakage.  She is also noted some nausea.  No falling.  Zofran is helping with nausea.  She wonders if her dizziness may relate to sinus pressure which she has had some of and she is also started wearing a new hearing aid in her right ear.  She continues prednisone 5 mg daily.  She has not needed famotidine.  She does use Ambien 10 mg at night and without this she does not sleep.  She also continues fluoxetine 20 mg twice a day.  She reports really good energy levels.  She has been able to be more active.  She is getting  ready to go to the [place] for 19 days with her sister for vacation.   Review of Systems As per HPI, otherwise negative  Current Medications, Allergies, Past Medical History, Past Surgical History, Family History and Social History were reviewed in [employer] record.     Objective:   Physical Exam BP 112/60   Pulse 95   Temp (!) 97 F (36.1 C)   Ht 5' 6"  (1.676 m)   Wt 146 lb (66.2 kg)   BMI 23.57 kg/m  Gen: awake, alert, NAD HEENT: anicteric, op clear CV: RRR, no mrg Pulm: CTA b/l Abd: soft, NT/ND, +BS throughout, scarring in the right middle abdomen from ostomy takedown which has healed nicely, previously healed vertical laparotomy scar from umbilicus to suprapubic abdomen. Ext: no c/c/e Neuro: nonfocal  CBC    Component Value Date/Time   WBC 7.8 01/11/2019 1028   RBC 5.20 (H) 01/11/2019 1028   HGB 15.3 (H) 01/11/2019 1028   HGB 13.7 12/06/2014 0804   HCT 45.9 01/11/2019 1028   HCT 41.1 12/06/2014 0804   PLT 215.0 01/11/2019 1028   PLT 303 12/06/2014 0804   MCV 88.2 01/11/2019 1028   MCV 95.8 12/06/2014 0804   MCH 32.9 06/09/2017 0348   MCHC 33.4 01/11/2019 1028   RDW 14.3 01/11/2019 1028   RDW 12.2 12/06/2014 0804   LYMPHSABS 2.6 01/11/2019 1028  LYMPHSABS 1.8 12/06/2014 0804   MONOABS 0.8 01/11/2019 1028   MONOABS 0.8 12/06/2014 0804   EOSABS 0.1 01/11/2019 1028   EOSABS 0.2 12/06/2014 0804   BASOSABS 0.1 01/11/2019 1028   BASOSABS 0.0 12/06/2014 0804   CMP     Component Value Date/Time   NA 135 01/11/2019 1028   NA 138 12/06/2014 0804   K 4.0 01/11/2019 1028   K 4.1 12/06/2014 0804   CL 99 01/11/2019 1028   CO2 28 01/11/2019 1028   CO2 25 12/06/2014 0804   GLUCOSE 84 01/11/2019 1028   GLUCOSE 92 12/06/2014 0804   BUN 13 01/11/2019 1028   BUN 12.3 12/06/2014 0804   CREATININE 0.88 01/11/2019 1028   CREATININE 0.8 12/06/2014 0804   CALCIUM 9.6 01/11/2019 1028   CALCIUM 9.4 12/06/2014 0804   PROT 6.8 01/11/2019 1028    PROT 6.5 12/06/2014 0804   ALBUMIN 4.0 01/11/2019 1028   ALBUMIN 3.3 (L) 12/06/2014 0804   AST 12 01/11/2019 1028   AST 9 12/06/2014 0804   ALT 7 01/11/2019 1028   ALT 10 12/06/2014 0804   ALKPHOS 69 01/11/2019 1028   ALKPHOS 89 12/06/2014 0804   BILITOT 0.5 01/11/2019 1028   BILITOT 0.37 12/06/2014 0804   GFRNONAA >60 06/09/2017 0348   GFRAA >60 06/09/2017 0348   Iron/TIBC/Ferritin/ %Sat    Component Value Date/Time   IRON 66 01/11/2019 1028   FERRITIN 28.1 01/11/2019 1028   IRONPCTSAT 16.4 (L) 01/11/2019 1028       Assessment & Plan:  78 year old female with a history of complicated Crohn's colitis with perianal involvement, 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   1.  Crohn's colitis with perianal involvement/prior diverting ileostomy taken down with 3 anastomosis on 09/28/2018 --clinically she is doing well but we discussed at length today that we are currently not treating her colitis.  Her colitis has been severe with history of perianal abscesses, all leading to prolonged hospitalization and eventual loop ileostomy.  She hated her ileostomy and hopes to never need further surgery.  In order to best treat her colitis I recommended that we resume Entyvio.  She seems to understand my rationale.  She is currently on only prednisone 5 mg which I explained is not adequately treating her colitis and that the diarrhea which we are treating symptomatically only is a direct result of her active colitis.  She is open to resuming Entyvio therapy when she returns from her beach vacation. --Continue prednisone 5 mg a day --Can continue Lomotil 2 tablets 3-4 times a day and loperamide 4 mg 3-4 times a day --Please schedule Entyvio  reinduction, likely at [facility], on 02/07/2019; after induction then continue therapy every 8 weeks --Lab work done today, see above.  QuantiFERON gold pending  2.  B12 deficiency --continue monthly IM B12  3.  Insomnia --continue Ambien 5 to 10 mg nightly as needed  4.  GERD dyspepsia/nausea --not much dyspeptic symptoms of late.  She has been off of famotidine which is okay.  She can use it as needed.  Can continue Zofran 4 mg every 6 hours as needed for nausea and vomiting.  Return in Dec 2020  25 minutes spent with the patient today. Greater than 50% was spent in counseling and  coordination of care with the patient

## 2019-01-13 ENCOUNTER — Telehealth: Payer: Self-pay

## 2019-01-13 ENCOUNTER — Other Ambulatory Visit: Payer: Self-pay

## 2019-01-13 NOTE — Telephone Encounter (Signed)
Spoke with Jenny Reichmann [facility] assistance program), she was calling to set up shipment for drug. Address and phone number for delivery given. Shipment to come out 01/26/19.

## 2019-01-13 NOTE — Telephone Encounter (Signed)
Pt scheduled for Entyvio 02/07/19@10am . Pt to receive at 0,2, 6, then every 8 weeks. Left message for pt to call back.  Spoke with pt and she is aware of appt date and time.  First dose is at the hospital pharmacy. Future shipments need to go to:  [facility] [place] [address] [phone]

## 2019-01-13 NOTE — Progress Notes (Signed)
Pt scheduled for entyvio at [facility] 02/07/19@10am . See phone note.

## 2019-01-14 LAB — QUANTIFERON-TB GOLD PLUS
Mitogen-NIL: >10 IU/mL
NIL: 0.05 IU/mL
QuantiFERON-TB Gold Plus: NEGATIVE
TB1-NIL: 0.01 IU/mL
TB2-NIL: <0 IU/mL

## 2019-01-31 ENCOUNTER — Telehealth: Payer: Self-pay | Admitting: Internal Medicine

## 2019-01-31 NOTE — Telephone Encounter (Signed)
Pls call pt, she needs to speak with you about her entyvio.

## 2019-01-31 NOTE — Telephone Encounter (Signed)
Pt called and cancelled her appt for entyvio. States she feels the best right now she has felt in 3 years and she does not want to proceed with treatment right now. She knows she needs to follow-up with Dr. Hilarie Fredrickson in December.

## 2019-01-31 NOTE — Telephone Encounter (Signed)
Ok, thanks Noted.

## 2019-02-07 ENCOUNTER — Inpatient Hospital Stay (HOSPITAL_COMMUNITY): Admission: RE | Admit: 2019-02-07 | Payer: Medicare HMO | Source: Ambulatory Visit

## 2019-02-07 ENCOUNTER — Other Ambulatory Visit: Payer: Self-pay | Admitting: Internal Medicine

## 2019-02-07 DIAGNOSIS — K50113 Crohn's disease of large intestine with fistula: Secondary | ICD-10-CM

## 2019-02-08 ENCOUNTER — Other Ambulatory Visit: Payer: Self-pay | Admitting: Internal Medicine

## 2019-02-08 DIAGNOSIS — K50113 Crohn's disease of large intestine with fistula: Secondary | ICD-10-CM

## 2019-02-15 ENCOUNTER — Other Ambulatory Visit: Payer: Self-pay | Admitting: Internal Medicine

## 2019-02-15 DIAGNOSIS — K51911 Ulcerative colitis, unspecified with rectal bleeding: Secondary | ICD-10-CM

## 2019-02-15 DIAGNOSIS — E538 Deficiency of other specified B group vitamins: Secondary | ICD-10-CM

## 2019-02-15 DIAGNOSIS — K6289 Other specified diseases of anus and rectum: Secondary | ICD-10-CM

## 2019-02-28 ENCOUNTER — Telehealth: Payer: Self-pay | Admitting: Internal Medicine

## 2019-02-28 NOTE — Telephone Encounter (Signed)
See below and advise

## 2019-02-28 NOTE — Telephone Encounter (Signed)
Yes, ok to come for influenza vaccination

## 2019-03-01 ENCOUNTER — Other Ambulatory Visit: Payer: Self-pay

## 2019-03-01 NOTE — Telephone Encounter (Signed)
Pt calling in regarding this msg

## 2019-03-01 NOTE — Telephone Encounter (Signed)
Called pt to schedule flu shot and she states she has been calling every day trying to set up an OV for December. States she was told there were no appts. Spoke with pt and let her know there are some spots that are held the schedulers do not see. Scheduled pt to see Dr. Hilarie Fredrickson 03/29/19@1 :30pm. Pt wants to receive flu shot at that visit also. Pt aware of appt.

## 2019-03-07 ENCOUNTER — Other Ambulatory Visit: Payer: Self-pay | Admitting: Internal Medicine

## 2019-03-07 DIAGNOSIS — K50113 Crohn's disease of large intestine with fistula: Secondary | ICD-10-CM

## 2019-03-29 ENCOUNTER — Encounter: Payer: Self-pay | Admitting: Internal Medicine

## 2019-03-29 ENCOUNTER — Ambulatory Visit: Payer: Medicare HMO | Admitting: Internal Medicine

## 2019-03-29 VITALS — BP 120/70 | HR 80 | Temp 97.6°F | Ht 66.0 in | Wt 151.0 lb

## 2019-03-29 DIAGNOSIS — K219 Gastro-esophageal reflux disease without esophagitis: Secondary | ICD-10-CM

## 2019-03-29 DIAGNOSIS — K50113 Crohn's disease of large intestine with fistula: Secondary | ICD-10-CM

## 2019-03-29 DIAGNOSIS — G4701 Insomnia due to medical condition: Secondary | ICD-10-CM | POA: Diagnosis not present

## 2019-03-29 DIAGNOSIS — Z23 Encounter for immunization: Secondary | ICD-10-CM | POA: Diagnosis not present

## 2019-03-29 DIAGNOSIS — E538 Deficiency of other specified B group vitamins: Secondary | ICD-10-CM | POA: Diagnosis not present

## 2019-03-29 DIAGNOSIS — R1013 Epigastric pain: Secondary | ICD-10-CM | POA: Diagnosis not present

## 2019-03-29 DIAGNOSIS — F419 Anxiety disorder, unspecified: Secondary | ICD-10-CM

## 2019-03-29 DIAGNOSIS — K50119 Crohn's disease of large intestine with unspecified complications: Secondary | ICD-10-CM

## 2019-03-29 DIAGNOSIS — F329 Major depressive disorder, single episode, unspecified: Secondary | ICD-10-CM

## 2019-03-29 MED ORDER — FLUOXETINE HCL 20 MG PO CAPS: 20.0000 mg | ORAL_CAPSULE | Freq: Every day | ORAL | 1 refills | Status: DC

## 2019-03-29 MED ORDER — ZOLPIDEM TARTRATE 10 MG PO TABS: 10.0000 mg | ORAL_TABLET | Freq: Every evening | ORAL | 5 refills | Status: DC | PRN

## 2019-03-29 MED ORDER — CYANOCOBALAMIN 1000 MCG/ML IJ SOLN: 1000.0000 ug | INTRAMUSCULAR | 1 refills | Status: DC

## 2019-03-29 NOTE — Progress Notes (Signed)
Subjective:    Patient ID: Sara Barnes, female    DOB: 06/30/1940, 78 y.o.   MRN: [id number]  HPI Amada Hallisey is a 78 year old female with a history of complicated Crohn's disease of the colon with perianal involvement prior end ileostomy with subsequent takedown on 09/28/2018, prior right hemicolectomy after cecal perforation in the setting of severe Crohn's disease, GERD with dyspepsia, insomnia and history of depression who is here for follow-up.  She was last seen in the office just about 3 months ago on 01/11/2019.  We had made plans for to continue low-dose prednisone but also schedule Entyvio for October after she returned from a beach vacation.  She called notifying us that she did not want to proceed with Entyvio and thus infusion was canceled.  She reports that she is doing well.  She reports she is feeling the best that she has in 3 years.  She has had some discomfort near the site of her prior ileostomy which has been since taken down.  This is present when she is active such as reaching or lifting.  Not continuous.  She is taking prednisone 5 mg a day, Lomotil 2 tablets 3 times daily and loperamide 4 mg 3 times daily.  She still has loose stools usually in the evening and several hours after going to bed.  She notes that dinner is her largest meal.  With the antidiarrheal medications she is functional.  No blood in her stool or melena.  She does still have some dizziness which may be related to her having one hearing aid rather than 2.  She may be getting a second hearing aid.  No falls.  Sleep is still not great but she is using Ambien.  She remains on fluoxetine 20 mg a day and B12 injections monthly.  She has rarely needed the famotidine probably less than once a month for indigestion symptoms.  Still does not want to use Entyvio because she is "scared" of it.   Review of Systems As per HPI, otherwise negative  Current Medications, Allergies, Past Medical History, Past Surgical  History, Family History and Social History were reviewed in [employer] record.     Objective:   Physical Exam BP 120/70   Pulse 80   Temp 97.6 F (36.4 C)   Ht 5' 6"  (1.676 m)   Wt 151 lb (68.5 kg)   BMI 24.37 kg/m  Gen: awake, alert, NAD HEENT: anicteric, op clear CV: RRR, no mrg Pulm: CTA b/l Abd: soft, NT/ND, +BS throughout, small abdominal wall hernia at the prior ostomy site in the right lower quadrant on the lateral edge of this healed incision Ext: no c/c/e Neuro: nonfocal  CBC    Component Value Date/Time   WBC 7.8 01/11/2019 1028   RBC 5.20 (H) 01/11/2019 1028   HGB 15.3 (H) 01/11/2019 1028   HGB 13.7 12/06/2014 0804   HCT 45.9 01/11/2019 1028   HCT 41.1 12/06/2014 0804   PLT 215.0 01/11/2019 1028   PLT 303 12/06/2014 0804   MCV 88.2 01/11/2019 1028   MCV 95.8 12/06/2014 0804   MCH 32.9 06/09/2017 0348   MCHC 33.4 01/11/2019 1028   RDW 14.3 01/11/2019 1028   RDW 12.2 12/06/2014 0804   LYMPHSABS 2.6 01/11/2019 1028   LYMPHSABS 1.8 12/06/2014 0804   MONOABS 0.8 01/11/2019 1028   MONOABS 0.8 12/06/2014 0804   EOSABS 0.1 01/11/2019 1028   EOSABS 0.2 12/06/2014 0804   BASOSABS 0.1 01/11/2019 1028  BASOSABS 0.0 12/06/2014 0804   CMP     Component Value Date/Time   NA 135 01/11/2019 1028   NA 138 12/06/2014 0804   K 4.0 01/11/2019 1028   K 4.1 12/06/2014 0804   CL 99 01/11/2019 1028   CO2 28 01/11/2019 1028   CO2 25 12/06/2014 0804   GLUCOSE 84 01/11/2019 1028   GLUCOSE 92 12/06/2014 0804   BUN 13 01/11/2019 1028   BUN 12.3 12/06/2014 0804   CREATININE 0.88 01/11/2019 1028   CREATININE 0.8 12/06/2014 0804   CALCIUM 9.6 01/11/2019 1028   CALCIUM 9.4 12/06/2014 0804   PROT 6.8 01/11/2019 1028   PROT 6.5 12/06/2014 0804   ALBUMIN 4.0 01/11/2019 1028   ALBUMIN 3.3 (L) 12/06/2014 0804   AST 12 01/11/2019 1028   AST 9 12/06/2014 0804   ALT 7 01/11/2019 1028   ALT 10 12/06/2014 0804   ALKPHOS 69 01/11/2019 1028   ALKPHOS 89  12/06/2014 0804   BILITOT 0.5 01/11/2019 1028   BILITOT 0.37 12/06/2014 0804   GFRNONAA >60 06/09/2017 0348   GFRAA >60 06/09/2017 0348         Assessment & Plan:   78 year old female with a history of complicated Crohn's disease of the colon with perianal involvement prior end ileostomy with subsequent takedown on 09/28/2018, prior right hemicolectomy after cecal perforation in the setting of severe Crohn's disease, GERD with dyspepsia, insomnia and history of depression who is here for follow-up.  1.  Crohn's colitis with perianal involvement --clinically she feels well.  We are not doing much medically to treat her colitis.  We are treating diarrhea symptomatically.  I have explained this many times in the past and while I continue to recommend Entyvio, she feels strongly that she wants to keep things where they are.  I have explained that we are using antidiarrheals to abate what is probably inflammatory diarrhea related to her known Crohn's disease.  I do not wish to increase prednisone as this would come with more side effects, more immunosuppression and higher risk long-term. --Continue prednisone 5 mg a day --We are holding on Entyvio as she does not wish to proceed --Continue Lomotil 2 tablets 3-4 times a day --Continue loperamide 4 mg 3-4 times a day --Flu vaccine today  2.  B12 deficiency --continue monthly IM B12  3.  Insomnia --related to #1 as well.  Continue Ambien 5 to 10 mg nightly as needed  4.  Anxiety and depression --stable and doing well on fluoxetine, continue 20 mg daily  5.  GERD with dyspepsia and history of nausea --improved overall.  Can use famotidine 40 mg twice daily as needed  92-monthfollow-up, sooner if needed

## 2019-03-29 NOTE — Patient Instructions (Addendum)
Normal BMI (Body Mass Index- based on height and weight) is between 23 and 30. Your BMI today is Body mass index is 24.37 kg/m. Marland Kitchen Please consider follow up  regarding your BMI with your Primary Care Provider.  Continue your medications as follows:  Prozac 447m: Take daily Monthly B12 injections Lomotil: 2 tablets 4 times a day Immodium 253m 2 tablets three times a day Prednisone 47m53mTake daily Ambien 53m67mhs Zofran 4mg 11m: Take every 6 hours as needed   We are giving you a flu shot today.    Thank you for entrusting me with your care and for choosing [facility]

## 2019-04-07 ENCOUNTER — Other Ambulatory Visit: Payer: Self-pay | Admitting: Internal Medicine

## 2019-04-20 ENCOUNTER — Telehealth: Payer: Self-pay

## 2019-04-20 NOTE — Telephone Encounter (Signed)
Information faxed. VM left.

## 2019-04-20 NOTE — Telephone Encounter (Signed)
Copied from Kimberly [phone]. Topic: General - Other >> Apr 11, 2019  1:58 PM Oneta Rack wrote: Patient requesting PCP nurse to fax over to [facility] the last date of service to [phone], patient would like a follow up call when completed

## 2019-05-02 ENCOUNTER — Telehealth: Payer: Self-pay | Admitting: Internal Medicine

## 2019-05-02 NOTE — Telephone Encounter (Signed)
Yes, okay to send that Rx

## 2019-05-02 NOTE — Telephone Encounter (Signed)
Pt states Dr. Hilarie Fredrickson was going to send in a prescription to [facility] on [address] for her Lomotil that the surgeon had been prescribing so she could get it at ta cheaper price. Pt states she is taking 2 tabs 3-4 times a day. She called [facility] and can get the lomotil #240 table fro 55.00. Please advise.

## 2019-05-03 NOTE — Telephone Encounter (Signed)
Prescription called into [facility] on [address] for pt, pt left message that script has been sent in for her.

## 2019-05-05 ENCOUNTER — Telehealth: Payer: Self-pay | Admitting: Internal Medicine

## 2019-05-05 NOTE — Telephone Encounter (Signed)
Spoke with pt and let her know the prescription for lomotil has been sent to the pharmacy,

## 2019-05-09 ENCOUNTER — Encounter: Payer: Self-pay | Admitting: Internal Medicine

## 2019-05-09 ENCOUNTER — Other Ambulatory Visit: Payer: Self-pay

## 2019-05-09 ENCOUNTER — Ambulatory Visit (INDEPENDENT_AMBULATORY_CARE_PROVIDER_SITE_OTHER): Payer: Medicare HMO | Admitting: Internal Medicine

## 2019-05-09 DIAGNOSIS — J019 Acute sinusitis, unspecified: Secondary | ICD-10-CM | POA: Diagnosis not present

## 2019-05-09 MED ORDER — AZITHROMYCIN 250 MG PO TABS: ORAL_TABLET | ORAL | 0 refills | Status: DC

## 2019-05-09 MED ORDER — FLUTICASONE PROPIONATE 50 MCG/ACT NA SUSP: 2.0000 | Freq: Every day | NASAL | 6 refills | Status: DC

## 2019-05-09 NOTE — Progress Notes (Addendum)
Subjective:    Patient ID: Sara Barnes, female    DOB: April 25, 1941, 79 y.o.   MRN: [id number]  DOS:  05/09/2019 Type of visit - description: Virtual Visit via Telephone  Attempted  to make this a video visit, due to technical difficulties from the patient side it was not possible  thus we proceeded with a Virtual Visit via Telephone    I connected with above mentioned patient  by telephone and verified that I am speaking with the correct person using two identifiers.  THIS ENCOUNTER IS A VIRTUAL VISIT DUE TO COVID-19 - PATIENT WAS NOT SEEN IN THE OFFICE. PATIENT HAS CONSENTED TO VIRTUAL VISIT / TELEMEDICINE VISIT   Location of patient: home  Location of provider: office  I discussed the limitations, risks, security and privacy concerns of performing an evaluation and management service by telephone and the availability of in person appointments. I also discussed with the patient that there may be a patient responsible charge related to this service. The patient expressed understanding and agreed to proceed.  Acute Symptoms are started approximately 10 days ago with sinus congestion and pressure, ear pain, green discharge from the nose. This is consistent with previous sinus infection.  Currently has only try Tylenol    Review of Systems Reports that she is not having any fever chills No nausea or vomiting No chest pain or difficulty breathing No cough No major myalgias.  Past Medical History:  Diagnosis Date  . Allergy   . Anemia   . Anxiety   . Arthritis   . Basal cell carcinoma   . Blood transfusion without reported diagnosis   . Bowel perforation ([place])   . Cancer of lower-inner quadrant of female breast ([place]) 11/30/2014   left  . Cancer of lower-inner quadrant of left female breast ([place]) 11/30/2014  . Complication of anesthesia    anxiety post-op after ear surgeries  . Crohn's disease of large intestine with abscess ([place])   . Dental crowns present    recent root canal  11/2014  . Depression   . Family history of adverse reaction to anesthesia    pt's sister has hx. of post-op N/V  . GERD (gastroesophageal reflux disease)   . Hyperlipidemia   . Indigestion   . Perianal abscess   . Personal history of radiation therapy 2016  . PONV (postoperative nausea and vomiting)   . Runny nose 12/07/2014   clear drainage, per pt.  . Seasonal allergies   . TMJ (temporomandibular joint syndrome)   . Ulcerative colitis   . Vitamin B12 deficiency     Past Surgical History:  Procedure Laterality Date  . ABDOMINAL HYSTERECTOMY     partial  . APPENDECTOMY    . AUGMENTATION MAMMAPLASTY Bilateral   . BASAL CELL CARCINOMA EXCISION Left    nose  . BREAST BIOPSY Left 2019   benign  . BREAST ENHANCEMENT SURGERY    . BREAST LUMPECTOMY Left 2016  . BREAST LUMPECTOMY WITH RADIOACTIVE SEED AND SENTINEL LYMPH NODE BIOPSY Left 12/13/2014   Procedure: LEFT BREAST LUMPECTOMY WITH RADIOACTIVE SEED AND SENTINEL LYMPH NODE BIOPSY;  Surgeon: [name], MD;  Location: [facility];  Service: General;  Laterality: Left;  . COLONOSCOPY WITH PROPOFOL  05/28/2011; 06/28/2014  . EXPLORATORY LAPAROTOMY  04/08/2018   with end ileostomy takedown and creation of loop ileostomy-- [facility]-Dr Renelda Mom  . FLEXIBLE SIGMOIDOSCOPY N/A 01/08/2016   Procedure: FLEXIBLE SIGMOIDOSCOPY;  Surgeon: Renelda Loma Armbruster,  MD;  Location: [facility];  Service: Gastroenterology;  Laterality: N/A;  needs MAC due to pain and anxiety  . ILEOSTOMY N/A 05/25/2017   Procedure: DILATION OF ILEOSTOMY;  Surgeon: Rolm Bookbinder, MD;  Location: [facility];  Service: General;  Laterality: N/A;  . ILEOSTOMY  02/11/2016   END ileostomy.  Dr Hassell Done  . ILEOSTOMY CLOSURE N/A 06/02/2017   Procedure: RESECTION OF ILEAL STRICTURE; ILEOSTOMY REVISION ;  Surgeon: Michael Boston, MD;  Location: [facility];  Service: General;  Laterality: N/A;  . ILEOSTOMY DILATION  05/25/2017   DILITATION OF  ILEAL STRICTURE  . ileostomy stricture resection  06/02/2017   with revision  . INCISION AND DRAINAGE ABSCESS  2018  . INCISION AND DRAINAGE PERIRECTAL ABSCESS N/A 09/28/2013   Procedure: IRRIGATION AND DEBRIDEMENT PERIANAL ABSCESS, proctoscopy;  Surgeon: Shann Medal, MD;  Location[facility];  Service: General;  Laterality: N/A;  . INCISION AND DRAINAGE PERIRECTAL ABSCESS N/A 01/22/2016   Procedure: IRRIGATION AND DEBRIDEMENT PERIRECTAL ABSCESS;  Surgeon: Armandina Gemma, MD;  [facility];  Service: General;  Laterality: N/A;  . INNER EAR SURGERY Bilateral   . LAPAROSCOPIC DIVERTED COLOSTOMY N/A 02/11/2016   Procedure: LAPAROSCOPIC DIVERTED ILEOSTOMY;  Surgeon: Johnathan Hausen, MD;  [facility];  Service: General;  Laterality: N/A;  . LAPAROSCOPY N/A 03/23/2017   Procedure: LAPAROSCOPY DIAGNOSTIC, EXPLORATORY LAPAROTOMY,  BOWEL RESECTION;  Surgeon: Jackolyn Confer, MD;  Location[facility];  Service: General;  Laterality: N/A;    Social History   Socioeconomic History  . Marital status: Widowed    Spouse name: Not on file  . Number of children: 1  . Years of education: Not on file  . Highest education level: Not on file  Occupational History  . Occupation: Warden/ranger    Comment: retired at age 25  Tobacco Use  . Smoking status: Never Smoker  . Smokeless tobacco: Never Used  Substance and Sexual Activity  . Alcohol use: Yes    Alcohol/week: 7.0 standard drinks    Types: 7 Glasses of wine per week  . Drug use: No  . Sexual activity: Not Currently    Partners: Male  Other Topics Concern  . Not on file  Social History Narrative   No exercise due to health   Social Determinants of Health   Financial Resource Strain:   . Difficulty of Paying Living Expenses: Not on file  Food Insecurity:   . Worried About Charity fundraiser in the Last Year: Not on file  . Ran Out of Food in the Last Year: Not on file  Transportation Needs:   . Lack of Transportation (Medical): Not on  file  . Lack of Transportation (Non-Medical): Not on file  Physical Activity:   . Days of Exercise per Week: Not on file  . Minutes of Exercise per Session: Not on file  Stress:   . Feeling of Stress : Not on file  Social Connections:   . Frequency of Communication with Friends and Family: Not on file  . Frequency of Social Gatherings with Friends and Family: Not on file  . Attends Religious Services: Not on file  . Active Member of Clubs or Organizations: Not on file  . Attends Archivist Meetings: Not on file  . Marital Status: Not on file  Intimate Partner Violence:   . Fear of Current or Ex-Partner: Not on file  . Emotionally Abused: Not on file  . Physically Abused: Not on file  . Sexually Abused:  Not on file      Allergies as of 05/09/2019      Reactions   Codeine Nausea And Vomiting   Mesalamine Nausea And Vomiting   Nsaids    Crohn's disease = NO NSAIDs   Oxycodone Itching, Rash   Norco [hydrocodone-acetaminophen] Nausea Only   Other Itching   PERFUMED LOTIONS Cant have MRI due to ear implant    Sulfa Antibiotics Rash      Medication List       Accurate as of May 09, 2019 10:22 AM. If you have any questions, ask your nurse or doctor.        acetaminophen 500 MG tablet Commonly known as: TYLENOL You can take 2 tablets every 6 hours as needed for pain.  Use this as your primary pain control.  Use Tramadol as a last resort.  You can buy this over the counter at any drug store.    DO NOT TAKE MORE THAN 4000 MG OF TYLENOL PER DAY.  IT CAN HARM YOUR LIVER.   cyanocobalamin 1000 MCG/ML injection Commonly known as: (VITAMIN B-12) Inject 1 mL (1,000 mcg total) into the muscle every 30 (thirty) days.   diphenoxylate-atropine 2.5-0.025 MG tablet Commonly known as: LOMOTIL Take by mouth 4 (four) times daily as needed for diarrhea or loose stools.   famotidine 40 MG tablet Commonly known as: PEPCID Take 1 tablet (40 mg total) by mouth 2 (two) times  daily.   FLUoxetine 20 MG capsule Commonly known as: PROZAC Take 1 capsule (20 mg total) by mouth daily.   loperamide 2 MG capsule Commonly known as: IMODIUM Take 2 mg by mouth as needed for diarrhea or loose stools.   ondansetron 4 MG disintegrating tablet Commonly known as: Zofran ODT Take 1 tablet (4 mg total) by mouth every 6 (six) hours as needed for nausea or vomiting.   predniSONE 5 MG tablet Commonly known as: DELTASONE TAKE ONE TABLET BY MOUTH DAILY   zolpidem 10 MG tablet Commonly known as: AMBIEN Take 1 tablet (10 mg total) by mouth at bedtime as needed.           Objective:   Physical Exam There were no vitals taken for this visit. This is a virtual telephone visit.  She is alert and oriented x3, speaking in complete sentences    Assessment    79 year old female, PMH includes Crohn's disease GERD, dyspepsia, insomnia, depression presents with  Acute sinusitis: Symptoms consistent with acute sinusitis rather than a viral syndrome. She requests Zithromax which historically helps the best. Plan: Zithromax also encourage the use of Flonase. Robitussin as needed Rest, continue Tylenol as needed Definitely call if not gradually improving.   I discussed the assessment and treatment plan with the patient. The patient was provided an opportunity to ask questions and all were answered. The patient agreed with the plan and demonstrated an understanding of the instructions.   The patient was advised to call back or seek an in-person evaluation if the symptoms worsen or if the condition fails to improve as anticipated.  I provided > 22 minutes of non-face-to-face time during this encounter.

## 2019-06-28 ENCOUNTER — Other Ambulatory Visit: Payer: Self-pay | Admitting: Internal Medicine

## 2019-07-25 ENCOUNTER — Other Ambulatory Visit: Payer: Self-pay | Admitting: Internal Medicine

## 2019-08-30 ENCOUNTER — Telehealth: Payer: Self-pay | Admitting: Internal Medicine

## 2019-08-30 ENCOUNTER — Other Ambulatory Visit: Payer: Self-pay | Admitting: Internal Medicine

## 2019-08-30 NOTE — Telephone Encounter (Signed)
Pt will run out of some of her meds before her scheduled OV with Dr. Norman Herrlich. Let her know that since she has the [place] scheduled the meds will be refilled, she just needs to contact her pharmacy as she usually does for the refills.

## 2019-09-23 ENCOUNTER — Other Ambulatory Visit: Payer: Self-pay | Admitting: Internal Medicine

## 2019-09-28 ENCOUNTER — Other Ambulatory Visit: Payer: Self-pay | Admitting: *Deleted

## 2019-09-28 MED ORDER — DIPHENOXYLATE-ATROPINE 2.5-0.025 MG PO TABS: ORAL_TABLET | ORAL | 0 refills | Status: DC

## 2019-09-28 NOTE — Addendum Note (Signed)
Addended by: Larina Bras on: 09/28/2019 02:53 PM   Modules accepted: Orders

## 2019-09-28 NOTE — Telephone Encounter (Signed)
We got refill request for diphenoxylate 2-3 tablets 3-4 times daily #240. Per Dr Hilarie Fredrickson, he would like for script to read, take 1-2 tablets by mouth 3-4 times daily as needed for diarrhea. #240. Script sent.

## 2019-10-28 ENCOUNTER — Other Ambulatory Visit: Payer: Self-pay | Admitting: Internal Medicine

## 2019-10-28 DIAGNOSIS — E538 Deficiency of other specified B group vitamins: Secondary | ICD-10-CM

## 2019-10-31 ENCOUNTER — Other Ambulatory Visit: Payer: Self-pay | Admitting: Internal Medicine

## 2019-10-31 DIAGNOSIS — K50113 Crohn's disease of large intestine with fistula: Secondary | ICD-10-CM

## 2019-11-01 ENCOUNTER — Telehealth: Payer: Self-pay | Admitting: Internal Medicine

## 2019-11-01 NOTE — Telephone Encounter (Signed)
Pt is calling stating Pharmacy told her medication was denied.  Pt is scheduled for appt tomorrow.  However she needs this medication today before pharmacy closes.

## 2019-11-02 ENCOUNTER — Other Ambulatory Visit (INDEPENDENT_AMBULATORY_CARE_PROVIDER_SITE_OTHER): Payer: Medicare HMO

## 2019-11-02 ENCOUNTER — Ambulatory Visit: Payer: Medicare HMO | Admitting: Internal Medicine

## 2019-11-02 ENCOUNTER — Encounter: Payer: Self-pay | Admitting: Internal Medicine

## 2019-11-02 VITALS — BP 100/70 | HR 76 | Ht 65.0 in | Wt 153.1 lb

## 2019-11-02 DIAGNOSIS — K50119 Crohn's disease of large intestine with unspecified complications: Secondary | ICD-10-CM

## 2019-11-02 DIAGNOSIS — G4701 Insomnia due to medical condition: Secondary | ICD-10-CM

## 2019-11-02 DIAGNOSIS — E538 Deficiency of other specified B group vitamins: Secondary | ICD-10-CM | POA: Diagnosis not present

## 2019-11-02 DIAGNOSIS — K432 Incisional hernia without obstruction or gangrene: Secondary | ICD-10-CM | POA: Diagnosis not present

## 2019-11-02 DIAGNOSIS — F419 Anxiety disorder, unspecified: Secondary | ICD-10-CM | POA: Diagnosis not present

## 2019-11-02 DIAGNOSIS — R109 Unspecified abdominal pain: Secondary | ICD-10-CM | POA: Diagnosis not present

## 2019-11-02 DIAGNOSIS — F329 Major depressive disorder, single episode, unspecified: Secondary | ICD-10-CM | POA: Diagnosis not present

## 2019-11-02 DIAGNOSIS — F32A Depression, unspecified: Secondary | ICD-10-CM

## 2019-11-02 LAB — COMPREHENSIVE METABOLIC PANEL
ALT: 9 U/L (ref 0–35)
AST: 15 U/L (ref 0–37)
Albumin: 4.2 g/dL (ref 3.5–5.2)
Alkaline Phosphatase: 86 U/L (ref 39–117)
BUN: 10 mg/dL (ref 6–23)
CO2: 26 mEq/L (ref 19–32)
Calcium: 9.7 mg/dL (ref 8.4–10.5)
Chloride: 96 mEq/L (ref 96–112)
Creatinine, Ser: 1.19 mg/dL (ref 0.40–1.20)
GFR: 43.87 mL/min — ABNORMAL LOW (ref >60.00)
Glucose, Bld: 113 mg/dL — ABNORMAL HIGH (ref 70–99)
Potassium: 3.9 mEq/L (ref 3.5–5.1)
Sodium: 131 mEq/L — ABNORMAL LOW (ref 135–145)
Total Bilirubin: 0.8 mg/dL (ref 0.2–1.2)
Total Protein: 6.9 g/dL (ref 6.0–8.3)

## 2019-11-02 LAB — CBC WITH DIFFERENTIAL/PLATELET
Basophils Absolute: 0 10*3/uL (ref 0.0–0.1)
Basophils Relative: 0.4 % (ref 0.0–3.0)
Eosinophils Absolute: 0 10*3/uL (ref 0.0–0.7)
Eosinophils Relative: 0.2 % (ref 0.0–5.0)
HCT: 43 % (ref 36.0–46.0)
Hemoglobin: 14.8 g/dL (ref 12.0–15.0)
Lymphocytes Relative: 10.1 % — ABNORMAL LOW (ref 12.0–46.0)
Lymphs Abs: 1.2 10*3/uL (ref 0.7–4.0)
MCHC: 34.4 g/dL (ref 30.0–36.0)
MCV: 93.6 fl (ref 78.0–100.0)
Monocytes Absolute: 0.6 10*3/uL (ref 0.1–1.0)
Monocytes Relative: 5.5 % (ref 3.0–12.0)
Neutro Abs: 9.6 10*3/uL — ABNORMAL HIGH (ref 1.4–7.7)
Neutrophils Relative %: 83.8 % — ABNORMAL HIGH (ref 43.0–77.0)
Platelets: 210 10*3/uL (ref 150.0–400.0)
RBC: 4.59 Mil/uL (ref 3.87–5.11)
RDW: 12.1 % (ref 11.5–15.5)
WBC: 11.4 10*3/uL — ABNORMAL HIGH (ref 4.0–10.5)

## 2019-11-02 LAB — VITAMIN B12: Vitamin B-12: 1252 pg/mL — ABNORMAL HIGH (ref 211–911)

## 2019-11-02 MED ORDER — FLUOXETINE HCL 20 MG PO CAPS: 20.0000 mg | ORAL_CAPSULE | Freq: Every day | ORAL | 1 refills | Status: DC

## 2019-11-02 MED ORDER — CYANOCOBALAMIN 1000 MCG/ML IJ SOLN: INTRAMUSCULAR | 1 refills | Status: DC

## 2019-11-02 MED ORDER — DIPHENOXYLATE-ATROPINE 2.5-0.025 MG PO TABS: ORAL_TABLET | ORAL | 1 refills | Status: DC

## 2019-11-02 MED ORDER — LOPERAMIDE HCL 2 MG PO CAPS: 2.0000 mg | ORAL_CAPSULE | ORAL | 1 refills | Status: DC | PRN

## 2019-11-02 MED ORDER — ONDANSETRON 4 MG PO TBDP: ORAL_TABLET | ORAL | 1 refills | Status: DC

## 2019-11-02 NOTE — Telephone Encounter (Signed)
I have sent rx to pharmacy.

## 2019-11-02 NOTE — Progress Notes (Signed)
Subjective:    Patient ID: Sara Barnes, female    DOB: 1940-07-12, 79 y.o.   MRN: [id number]  HPI Sara Barnes is a 79 year old female with a history of complicated Crohn's disease of the colon with perianal involvement, prior and ileostomy with subsequent takedown 09/28/2018, prior right hemicolectomy after cecal perforation in the setting of severe Crohn's disease, GERD with dyspepsia, insomnia and history of depression who is here for follow-up.  She was last seen in December 2020 and is here today with her sister.  She reports in the last several months she has been having right-sided abdominal pain where her prior ileostomy was taken down.  This is worse with bending and lifting.  She has been working outside in her yard and garden quite a bit.  It is a dull pain.  At times this area seems more swollen.  Over the last several weeks she is also had more uncontrolled diarrhea which she has been taking her Lomotil and Imodium for.  She typically uses 2 Imodium and 2 Lomotil in the morning and then again after lunch.  On severe days she will repeat this late in the evening.  She has loose stools near daily more if she eats fruits and vegetables that are fresh.  She has recently tried eating watermelon and cantaloupe and this makes her stools looser.  No bleeding.  Some nausea no vomiting.  She can have nausea twice in 1 day but not again for 2 weeks.  She is remained on B12 injections.  Sleep is good with Ambien, not good without it.  Uses Zofran on occasion.  Continues on Prozac 20 mg daily.  Mood recently has been good.   Review of Systems As per HPI, otherwise negative  Current Medications, Allergies, Past Medical History, Past Surgical History, Family History and Social History were reviewed in [employer] record.     Objective:   Physical Exam BP 100/70 (BP Location: Right Arm, Patient Position: Sitting, Cuff Size: Normal)   Pulse 76   Ht 5' 5"  (1.651 m)   Wt 153  lb 2 oz (69.5 kg)   BMI 25.48 kg/m  Gen: awake, alert, NAD HEENT: anicteric, op clear CV: RRR, no mrg Pulm: CTA b/l Abd: soft, very tender over well-healed former ileostomy site in the right mid abdomen with palpable incisional hernia, nondistended, +BS throughout Ext: no c/c/e Neuro: nonfocal      Assessment & Plan:  79 year old female with a history of complicated Crohn's disease of the colon with perianal involvement, prior and ileostomy with subsequent takedown 09/28/2018, prior right hemicolectomy after cecal perforation in the setting of severe Crohn's disease, GERD with dyspepsia, insomnia and history of depression who is here for follow-up.   1.  Right-sided abdominal pain/Crohn's colitis with perianal involvement --her abdominal pain is felt more likely secondary to incisional hernia where her prior stoma was located rather than active Crohn's disease.  However her diarrhea is very likely inflammatory as we are not doing much medically to treat her colitis.  I have recommended Entyvio but she feels strongly that she does not want to proceed with any escalation of therapy or beginning Biologics.  She is interested in increase prednisone to try to control the diarrhea a little bit better. --CT scan of the abdomen pelvis with contrast --CBC, CMP --Increase prednisone to 10 mg daily x6 weeks and then return to 5 mg daily --Follow-up with me in September and if still present abdominal pain we  could consider referral to Dr. Hassell Done for hernia repair --Continue loperamide and Lomotil at prior doses  2.  B12 deficiency --continue monthly IM B12, check B12 level today  3.  Insomnia --related to #1, continue Ambien 5 to 10 mg nightly as needed  4.  Anxiety and depression --stable and doing well continue fluoxetine 20 mg daily  5. GERD/dyspepsia/nausea --as needed Zofran as before, currently not needing acid suppression therapy.  Can use famotidine 20 to 40 mg once to twice daily as  needed  Follow-up in September  30 minutes total spent today including patient facing time, coordination of care, reviewing medical history/procedures/pertinent radiology studies, and documentation of the encounter.

## 2019-11-02 NOTE — Patient Instructions (Addendum)
Your provider has requested that you go to the basement level for lab work before leaving today. Press "B" on the elevator. The lab is located at the first door on the left as you exit the elevator.  We have sent the following medications to your pharmacy for you to pick up at your convenience: loperimide Lomotil zofran prozac b12   Increase prednisone 10 mg daily x 6 weeks. Then DECREASE back down to 5 mg daily thereafter.  Please follow up with Dr Hilarie Fredrickson on 01/17/20 at 9:10 am.  You have been scheduled for a CT scan of the abdomen and pelvis at [facility] ([address]---this is in the same building as [employer]).   You are scheduled on Friday 11/11/19 at 2:00 pm. You should arrive 15 minutes prior to your appointment time for registration. Please follow the written instructions below on the day of your exam:  WARNING: IF YOU ARE ALLERGIC TO IODINE/X-RAY DYE, PLEASE NOTIFY RADIOLOGY IMMEDIATELY AT [phone]! YOU WILL BE GIVEN A 13 HOUR PREMEDICATION PREP.  1) Do not eat or drink anything after 10:00 am (4 hours prior to your test) 2) You have been given 2 bottles of oral contrast to drink. The solution may taste better if refrigerated, but do NOT add ice or any other liquid to this solution. Shake well before drinking.    Drink 1 bottle of contrast @ 12:00 pm (2 hours prior to your exam)  Drink 1 bottle of contrast @ 1:00 pm (1 hour prior to your exam)  You may take any medications as prescribed with a small amount of water, if necessary. If you take any of the following medications: METFORMIN, GLUCOPHAGE, GLUCOVANCE, AVANDAMET, RIOMET, FORTAMET, [place] MET, JANUMET, GLUMETZA or METAGLIP, you MAY be asked to HOLD this medication 48 hours AFTER the exam.  The purpose of you drinking the oral contrast is to aid in the visualization of your intestinal tract. The contrast solution may cause some diarrhea. Depending on your individual set of symptoms, you may  also receive an intravenous injection of x-ray contrast/dye. Plan on being at [facility] for 30 minutes or longer, depending on the type of exam you are having performed.  This test typically takes 30-45 minutes to complete.  If you have any questions regarding your exam or if you need to reschedule, you may call the CT department at [phone] between the hours of 8:00 am and 5:00 pm, Monday-Friday.  ________________________________________________________________ If you are age 28 or older, your body mass index should be between 23-30. Your Body mass index is 25.48 kg/m. If this is out of the aforementioned range listed, please consider follow up with your Primary Care Provider.  If you are age 52 or younger, your body mass index should be between 19-25. Your Body mass index is 25.48 kg/m. If this is out of the aformentioned range listed, please consider follow up with your Primary Care Provider.   ________________________________________________________ Due to recent changes in healthcare laws, you may see the results of your imaging and laboratory studies on MyChart before your provider has had a chance to review them.  We understand that in some cases there may be results that are confusing or concerning to you. Not all laboratory results come back in the same time frame and the provider may be waiting for multiple results in order to interpret others.  Please give [place] 48 hours in order for your provider to thoroughly review all the results before contacting  the office for clarification of your results.

## 2019-11-03 ENCOUNTER — Telehealth: Payer: Self-pay | Admitting: Internal Medicine

## 2019-11-03 MED ORDER — DIPHENOXYLATE-ATROPINE 2.5-0.025 MG PO TABS: ORAL_TABLET | ORAL | 1 refills | Status: DC

## 2019-11-03 MED ORDER — PREDNISONE 10 MG PO TABS
10.0000 mg | ORAL_TABLET | Freq: Every day | ORAL | 0 refills | Status: AC
Start: 2019-11-03 — End: 2019-12-18

## 2019-11-03 NOTE — Addendum Note (Signed)
Addended by: Larina Bras on: 11/03/2019 03:39 PM   Modules accepted: Orders

## 2019-11-03 NOTE — Telephone Encounter (Signed)
Spoke with patient who requests that her script for lomotil be sent to [facility] rather than [facility] like her other medications are. I have done this and have called [facility] to d/c lomotil script. Patient also questions if her other scripts were sent to [facility] since she has not been to the pharmacy to check. I advised that they have been. Patient states that she was only given 1 ml of b12. I told her we just sent 3 ml with 1 refill. She says pharmacy told her otherwise.   Patient requesting refills of prednisone since she is almost out. Rx sent for prednisone 10 mg tablets.

## 2019-11-08 ENCOUNTER — Telehealth: Payer: Self-pay | Admitting: Internal Medicine

## 2019-11-08 NOTE — Telephone Encounter (Signed)
Called patient's sister Vaughan Basta) twice, phone rang and then a busy signal. Unable to leave vm.

## 2019-11-08 NOTE — Telephone Encounter (Signed)
Pt's sister is inquiring about the pt's parking disability sticker form she dropped off last Friday. Caller is wanting to know if this is ready for her to pick up.

## 2019-11-11 ENCOUNTER — Ambulatory Visit (INDEPENDENT_AMBULATORY_CARE_PROVIDER_SITE_OTHER)
Admission: RE | Admit: 2019-11-11 | Discharge: 2019-11-11 | Disposition: A | Discharge status: Home / Self Care | Payer: Medicare HMO | Source: Ambulatory Visit | Attending: Internal Medicine | Admitting: Internal Medicine

## 2019-11-11 ENCOUNTER — Other Ambulatory Visit: Payer: Self-pay

## 2019-11-11 DIAGNOSIS — R109 Unspecified abdominal pain: Secondary | ICD-10-CM | POA: Diagnosis not present

## 2019-11-11 DIAGNOSIS — K6389 Other specified diseases of intestine: Secondary | ICD-10-CM | POA: Diagnosis not present

## 2019-11-11 DIAGNOSIS — K760 Fatty (change of) liver, not elsewhere classified: Secondary | ICD-10-CM | POA: Diagnosis not present

## 2019-11-11 DIAGNOSIS — K50119 Crohn's disease of large intestine with unspecified complications: Secondary | ICD-10-CM | POA: Diagnosis not present

## 2019-11-11 DIAGNOSIS — K432 Incisional hernia without obstruction or gangrene: Secondary | ICD-10-CM | POA: Diagnosis not present

## 2019-11-11 DIAGNOSIS — K529 Noninfective gastroenteritis and colitis, unspecified: Secondary | ICD-10-CM | POA: Diagnosis not present

## 2019-11-11 DIAGNOSIS — K449 Diaphragmatic hernia without obstruction or gangrene: Secondary | ICD-10-CM | POA: Diagnosis not present

## 2019-11-11 MED ORDER — IOHEXOL 300 MG/ML  SOLN
100.0000 mL | Freq: Once | INTRAMUSCULAR | Status: AC | PRN
  Administered 2019-11-11: 100 mL via INTRAVENOUS

## 2019-11-11 NOTE — Telephone Encounter (Signed)
Spoke with patient regarding CT scan results, she stated that her sister was able to pick up her disability sticker.

## 2019-11-14 ENCOUNTER — Other Ambulatory Visit: Payer: Self-pay | Admitting: General Surgery

## 2019-11-14 ENCOUNTER — Other Ambulatory Visit: Payer: Self-pay | Admitting: Family Medicine

## 2019-11-14 DIAGNOSIS — Z9889 Other specified postprocedural states: Secondary | ICD-10-CM

## 2019-11-28 ENCOUNTER — Other Ambulatory Visit: Payer: Self-pay | Admitting: Internal Medicine

## 2019-11-28 DIAGNOSIS — K50113 Crohn's disease of large intestine with fistula: Secondary | ICD-10-CM

## 2019-12-14 ENCOUNTER — Other Ambulatory Visit: Payer: Self-pay

## 2019-12-14 ENCOUNTER — Ambulatory Visit
Admission: RE | Admit: 2019-12-14 | Discharge: 2019-12-14 | Disposition: A | Discharge status: Home / Self Care | Payer: Medicare HMO | Source: Ambulatory Visit | Attending: Family Medicine | Admitting: Family Medicine

## 2019-12-14 ENCOUNTER — Other Ambulatory Visit: Payer: Self-pay | Admitting: Family Medicine

## 2019-12-14 DIAGNOSIS — Z9889 Other specified postprocedural states: Secondary | ICD-10-CM

## 2019-12-14 DIAGNOSIS — Z853 Personal history of malignant neoplasm of breast: Secondary | ICD-10-CM | POA: Diagnosis not present

## 2019-12-14 DIAGNOSIS — R928 Other abnormal and inconclusive findings on diagnostic imaging of breast: Secondary | ICD-10-CM | POA: Diagnosis not present

## 2020-01-17 ENCOUNTER — Ambulatory Visit: Payer: Medicare HMO | Admitting: Internal Medicine

## 2020-01-17 ENCOUNTER — Encounter: Payer: Self-pay | Admitting: Internal Medicine

## 2020-01-17 ENCOUNTER — Telehealth: Payer: Self-pay

## 2020-01-17 ENCOUNTER — Other Ambulatory Visit (INDEPENDENT_AMBULATORY_CARE_PROVIDER_SITE_OTHER): Payer: Medicare HMO

## 2020-01-17 ENCOUNTER — Telehealth: Payer: Self-pay | Admitting: Internal Medicine

## 2020-01-17 VITALS — BP 120/74 | HR 79 | Ht 66.0 in | Wt 153.0 lb

## 2020-01-17 DIAGNOSIS — K50119 Crohn's disease of large intestine with unspecified complications: Secondary | ICD-10-CM

## 2020-01-17 DIAGNOSIS — M79604 Pain in right leg: Secondary | ICD-10-CM | POA: Diagnosis not present

## 2020-01-17 DIAGNOSIS — R630 Anorexia: Secondary | ICD-10-CM

## 2020-01-17 DIAGNOSIS — G4701 Insomnia due to medical condition: Secondary | ICD-10-CM

## 2020-01-17 DIAGNOSIS — R5381 Other malaise: Secondary | ICD-10-CM

## 2020-01-17 DIAGNOSIS — M79605 Pain in left leg: Secondary | ICD-10-CM

## 2020-01-17 DIAGNOSIS — F329 Major depressive disorder, single episode, unspecified: Secondary | ICD-10-CM

## 2020-01-17 DIAGNOSIS — D84821 Immunodeficiency due to drugs: Secondary | ICD-10-CM

## 2020-01-17 DIAGNOSIS — E538 Deficiency of other specified B group vitamins: Secondary | ICD-10-CM

## 2020-01-17 DIAGNOSIS — R350 Frequency of micturition: Secondary | ICD-10-CM

## 2020-01-17 DIAGNOSIS — Z7952 Long term (current) use of systemic steroids: Secondary | ICD-10-CM | POA: Diagnosis not present

## 2020-01-17 DIAGNOSIS — R351 Nocturia: Secondary | ICD-10-CM

## 2020-01-17 DIAGNOSIS — F419 Anxiety disorder, unspecified: Secondary | ICD-10-CM

## 2020-01-17 DIAGNOSIS — R252 Cramp and spasm: Secondary | ICD-10-CM | POA: Diagnosis not present

## 2020-01-17 LAB — URINALYSIS, ROUTINE W REFLEX MICROSCOPIC
Bilirubin Urine: NEGATIVE
Ketones, ur: NEGATIVE
Leukocytes,Ua: NEGATIVE
Nitrite: NEGATIVE
Specific Gravity, Urine: <=1.005 — AB (ref 1.000–1.030)
Total Protein, Urine: NEGATIVE
Urine Glucose: NEGATIVE
Urobilinogen, UA: 0.2 (ref 0.0–1.0)
WBC, UA: NONE SEEN (ref 0–?)
pH: 6.5 (ref 5.0–8.0)

## 2020-01-17 LAB — COMPREHENSIVE METABOLIC PANEL
ALT: 9 U/L (ref 0–35)
AST: 19 U/L (ref 0–37)
Albumin: 4.1 g/dL (ref 3.5–5.2)
Alkaline Phosphatase: 73 U/L (ref 39–117)
BUN: 12 mg/dL (ref 6–23)
CO2: 25 mEq/L (ref 19–32)
Calcium: 9.3 mg/dL (ref 8.4–10.5)
Chloride: 95 mEq/L — ABNORMAL LOW (ref 96–112)
Creatinine, Ser: 0.87 mg/dL (ref 0.40–1.20)
GFR: 62.93 mL/min (ref >60.00)
Glucose, Bld: 93 mg/dL (ref 70–99)
Potassium: 4.6 mEq/L (ref 3.5–5.1)
Sodium: 129 mEq/L — ABNORMAL LOW (ref 135–145)
Total Bilirubin: 0.6 mg/dL (ref 0.2–1.2)
Total Protein: 6.9 g/dL (ref 6.0–8.3)

## 2020-01-17 LAB — MAGNESIUM: Magnesium: 2.1 mg/dL (ref 1.5–2.5)

## 2020-01-17 LAB — FERRITIN: Ferritin: 116.5 ng/mL (ref 10.0–291.0)

## 2020-01-17 MED ORDER — FAMOTIDINE 20 MG PO TABS: 20.0000 mg | ORAL_TABLET | Freq: Two times a day (BID) | ORAL | 1 refills | Status: DC

## 2020-01-17 MED ORDER — ZOLPIDEM TARTRATE 10 MG PO TABS: 10.0000 mg | ORAL_TABLET | Freq: Every evening | ORAL | 0 refills | Status: DC | PRN

## 2020-01-17 NOTE — Telephone Encounter (Signed)
Called pt with UA result and she had another concern. States she got a Dietitian for [facility] [employer] and took it to her new Holiday representative but they would not fill it because she had picked up the script from [facility] on the 2nd. She tried to explain the error but was told they could not fill the new script. Pt wants to know what she should do now. She still has the old script. Pt states she was working with Carla Drape on this.

## 2020-01-17 NOTE — Telephone Encounter (Signed)
See additional phone note dated 01/17/20.

## 2020-01-17 NOTE — Telephone Encounter (Signed)
Pt is requesting a call back from a nurse to discuss a medication problem she encountered. (Pepcid)

## 2020-01-17 NOTE — Patient Instructions (Signed)
Your provider has requested that you go to the basement level for lab work before leaving today. Press "B" on the elevator. The lab is located at the first door on the left as you exit the elevator.  We have given you a prescription for Ambien to take to [facility] [place] (we have already faxed this but want you to have the written script in the case they did not get the faxed prescription)  We have sent the following medications to your pharmacy for you to pick up at your convenience: Famotidine 20 mg twice daily  If you are age 77 or older, your body mass index should be between 23-30. Your Body mass index is 24.69 kg/m. If this is out of the aforementioned range listed, please consider follow up with your Primary Care Provider.  If you are age 100 or younger, your body mass index should be between 19-25. Your Body mass index is 24.69 kg/m. If this is out of the aformentioned range listed, please consider follow up with your Primary Care Provider.   Due to recent changes in healthcare laws, you may see the results of your imaging and laboratory studies on MyChart before your provider has had a chance to review them.  We understand that in some cases there may be results that are confusing or concerning to you. Not all laboratory results come back in the same time frame and the provider may be waiting for multiple results in order to interpret others.  Please give [place] 48 hours in order for your provider to thoroughly review all the results before contacting the office for clarification of your results.

## 2020-01-17 NOTE — Telephone Encounter (Signed)
I have spoken to patient to advise her that unfortunately, we will have to wait until 01/28/20 to have her Lorrin Mais filled since the last "fill date" was 12/29/19. 12/29/19 was the date she got the incorrect # of tablets at the pharmacy (patient got 72 ambien tablets rather than 30. She immediately called to advise pharmacy but they insisted they only gave her 32. Dr Hilarie Fredrickson counted these today and did count at least 60+ tabs). Patient was uncomfortable taking these due to the inaccuracy of script, therefore we attempted to send a new script and have her give [place] the old script for disposal. Unfortunately, since a new script cannot be provided, we ask that she wait until the 01/28/20 date and we will go from there with additional scripts.

## 2020-01-17 NOTE — Progress Notes (Signed)
Subjective:    Patient ID: Sara Barnes, female    DOB: 04-16-1941, 79 y.o.   MRN: [id number]  HPI Sara Barnes is a 79 year old female with a past medical history of complicated Crohn's disease of the colon with perianal involvement, prior end ileostomy with subsequent takedown on 09/28/2018, prior right hemicolectomy after cecal perforation in the setting of severe Crohn's disease, history of GERD with dyspepsia, insomnia, depression who is here for follow-up.  She was last seen on 11/02/2019 and she is here today with her sister Sara Barnes.  She reports that she has been feeling poorly in the last several weeks.  She is having pain in her legs and feet as well as cramping even in her toes.  She is having urinary frequency and reports she is up every hour at night to urinate.  This is unlike prior cystitis when she was having hesitancy and producing very little urine despite feeling the need to void.  She reports she is having regular volume of urine passing every hour.  She is also lost her appetite.  She has her usual diarrhea without any change in frequency or consistency.  No blood in her stool.  No perianal pain.  No nocturnal stools.  She is taking prednisone 5 mg daily.  She continues to use intermittent Lomotil and loperamide.  She is having some heartburn.  She is off of famotidine which she was previously taking.  She reports using Ambien but the generic pill looked different and she reports despite the fact that she had a prescription for 30 tablets more than 60 tablets were dispensed by [facility]  She called the pharmacy because she was concerned and she felt dismissed.  She would like to potentially change pharmacies.  She feels that since changing Ambien it does not work as well and that seems to be when she started feeling more poorly.   Review of Systems As per HPI, otherwise negative  Current Medications, Allergies, Past Medical History, Past Surgical History,  Family History and Social History were reviewed in [employer] record.     Objective:   Physical Exam BP 120/74   Pulse 79   Ht 5' 6"  (1.676 m)   Wt 153 lb (69.4 kg)   BMI 24.69 kg/m  Gen: awake, alert, NAD HEENT: anicteric CV: RRR, no mrg Pulm: CTA b/l Abd: soft, NT/ND, +BS throughout Ext: no c/c/e Neuro: nonfocal  CT ABDOMEN AND PELVIS WITH CONTRAST   TECHNIQUE: Multidetector CT imaging of the abdomen and pelvis was performed using the standard protocol following bolus administration of intravenous contrast.   CONTRAST:  166m OMNIPAQUE IOHEXOL 300 MG/ML  SOLN   [facility]  [facility] Chest: No acute findings.   Hepatobiliary: No hepatic masses identified. Mild diffuse hepatic steatosis again noted. Gallbladder is unremarkable. No evidence of biliary ductal dilatation.   Pancreas:  No mass or inflammatory changes.   Spleen: Within normal limits in size and appearance.   Adrenals/Urinary Tract: No masses identified. No evidence of ureteral calculi or hydronephrosis.   Stomach/Bowel: Small hiatal hernia again noted. Postop changes again seen from right hemicolectomy. Small bowel is unremarkable. Mild wall thickening is again seen involving the rectosigmoid colon, which is unchanged in appearance since previous study. No acute inflammatory changes or abnormal fluid collections identified.   Vascular/Lymphatic: No pathologically enlarged lymph nodes. No abdominal aortic aneurysm.   Reproductive: Prior hysterectomy noted. Adnexal regions are  unremarkable in appearance.   Other:  None.   Musculoskeletal:  No suspicious bone lesions identified.   IMPRESSION: Stable mild chronic colitis involving the rectosigmoid colon. No radiographic evidence of acute colitis or other complication.   Stable small hiatal hernia.   Mild hepatic steatosis.     Electronically Signed   By: Marlaine Hind  M.D.   On: 11/11/2019 16:00      Assessment & Plan:  79 year old female with a past medical history of complicated Crohn's disease of the colon with perianal involvement, prior end ileostomy with subsequent takedown on 09/28/2018, prior right hemicolectomy after cecal perforation in the setting of severe Crohn's disease, history of GERD with dyspepsia, insomnia, depression who is here for follow-up.  1.  Crohn's colitis with perianal involvement --her chronic diarrhea relates to her colitis which is currently treated only with low-dose steroid.  We have discussed multiple times the use of Entyvio which I have recommended.  She wishes to defer further therapy or beginning biologic therapy.  CT scan done just 2 months ago showed chronic colitis changes but no other significant pathology. --Continue prednisone 5 mg daily --Continue loperamide and Lomotil as needed per dosing instructions  2.  Urinary frequency with nocturia --urinalysis and culture.  If unremarkable she should be seen by urology.  3.  Loss of appetite/general malaise and fatigue/muscle and leg cramping --she has a history of GERD and dyspepsia and has been on famotidine.  This could be contributing to loss of appetite and certainly to heartburn.  I think starting famotidine back is likely to be beneficial.  I am going to check electrolytes today including magnesium to see if this explains her lower extremity cramping.  Ferritin checked today as well --Resume famotidine 20 mg twice daily --CMP, Mg, CBC, iron studies  4. B12 def -- continue B12 IM, B12 levels good when checked recently  5. Insomnia --chronic Ambien use at 10 mg nightly.  Her generic manufacturer did change.  She brought the tablets with her today and there are clearly more than 30 in the bottle.  This appears to be a pharmacy error.  I did look at the tablets they are marked [id number] which according to a pill identifier is in fact Ambien 10 mg.  She is interested in  changing pharmacies which I think is very reasonable.  I am okay giving her a new prescription for Ambien 10 mg #30 to be used nightly as needed.  She would like to switch to [facility] and if they have a different generic this may work better for her.  She will need to bring those tablets here and surrender them to [place] so that we can dispose of them properly.  6.  Depression --continue fluoxetine 20 mg daily  7.  GERD/dyspepsia/nausea --see #3, can continue Zofran 4 mg every 6-8 hours as needed for nausea  62-monthfollow-up

## 2020-01-18 LAB — URINE CULTURE
MICRO NUMBER:: 10976235
SPECIMEN QUALITY:: ADEQUATE

## 2020-01-18 LAB — CBC WITH DIFFERENTIAL/PLATELET
Basophils Absolute: 0.1 10*3/uL (ref 0.0–0.1)
Basophils Relative: 1 % (ref 0.0–3.0)
Eosinophils Absolute: 0.2 10*3/uL (ref 0.0–0.7)
Eosinophils Relative: 1.7 % (ref 0.0–5.0)
HCT: 40.6 % (ref 36.0–46.0)
Hemoglobin: 13.8 g/dL (ref 12.0–15.0)
Lymphocytes Relative: 20.3 % (ref 12.0–46.0)
Lymphs Abs: 1.9 10*3/uL (ref 0.7–4.0)
MCHC: 34 g/dL (ref 30.0–36.0)
MCV: 94.8 fl (ref 78.0–100.0)
Monocytes Absolute: 0.9 10*3/uL (ref 0.1–1.0)
Monocytes Relative: 9.9 % (ref 3.0–12.0)
Neutro Abs: 6.1 10*3/uL (ref 1.4–7.7)
Neutrophils Relative %: 67.1 % (ref 43.0–77.0)
Platelets: 257 10*3/uL (ref 150.0–400.0)
RBC: 4.28 Mil/uL (ref 3.87–5.11)
RDW: 12.7 % (ref 11.5–15.5)
WBC: 9.1 10*3/uL (ref 4.0–10.5)

## 2020-01-23 ENCOUNTER — Other Ambulatory Visit: Payer: Self-pay

## 2020-01-23 ENCOUNTER — Ambulatory Visit (INDEPENDENT_AMBULATORY_CARE_PROVIDER_SITE_OTHER): Payer: Medicare HMO | Admitting: Family Medicine

## 2020-01-23 ENCOUNTER — Encounter: Payer: Self-pay | Admitting: Family Medicine

## 2020-01-23 VITALS — BP 131/80 | HR 73 | Temp 98.2°F | Resp 18 | Ht 66.0 in | Wt 157.1 lb

## 2020-01-23 DIAGNOSIS — E871 Hypo-osmolality and hyponatremia: Secondary | ICD-10-CM | POA: Diagnosis not present

## 2020-01-23 DIAGNOSIS — R1310 Dysphagia, unspecified: Secondary | ICD-10-CM | POA: Insufficient documentation

## 2020-01-23 DIAGNOSIS — R3129 Other microscopic hematuria: Secondary | ICD-10-CM

## 2020-01-23 DIAGNOSIS — Z23 Encounter for immunization: Secondary | ICD-10-CM

## 2020-01-23 DIAGNOSIS — R350 Frequency of micturition: Secondary | ICD-10-CM | POA: Diagnosis not present

## 2020-01-23 LAB — COMPREHENSIVE METABOLIC PANEL WITH GFR
AG Ratio: 1.7 (calc) (ref 1.0–2.5)
ALT: 8 U/L (ref 6–29)
AST: 13 U/L (ref 10–35)
Albumin: 4.1 g/dL (ref 3.6–5.1)
Alkaline phosphatase (APISO): 80 U/L (ref 37–153)
BUN/Creatinine Ratio: 13 (calc) (ref 6–22)
BUN: 15 mg/dL (ref 7–25)
CO2: 24 mmol/L (ref 20–32)
Calcium: 9.1 mg/dL (ref 8.6–10.4)
Chloride: 101 mmol/L (ref 98–110)
Creat: 1.15 mg/dL — ABNORMAL HIGH (ref 0.60–0.93)
Globulin: 2.4 g/dL (ref 1.9–3.7)
Glucose, Bld: 101 mg/dL — ABNORMAL HIGH (ref 65–99)
Potassium: 4.3 mmol/L (ref 3.5–5.3)
Sodium: 134 mmol/L — ABNORMAL LOW (ref 135–146)
Total Bilirubin: 0.5 mg/dL (ref 0.2–1.2)
Total Protein: 6.5 g/dL (ref 6.1–8.1)

## 2020-01-23 LAB — POC URINALSYSI DIPSTICK (AUTOMATED)
Bilirubin, UA: NEGATIVE
Glucose, UA: NEGATIVE
Ketones, UA: NEGATIVE
Leukocytes, UA: NEGATIVE
Nitrite, UA: NEGATIVE
Protein, UA: NEGATIVE
Spec Grav, UA: 1.025 (ref 1.010–1.025)
Urobilinogen, UA: 0.2 E.U./dL
pH, UA: 5 (ref 5.0–8.0)

## 2020-01-23 NOTE — Assessment & Plan Note (Addendum)
Start taking pepcid again  And f/u GI  ? Stricture

## 2020-01-23 NOTE — Assessment & Plan Note (Signed)
?   Excessive water intake  She will cut down on water intake and alcohol  Recheck today---- if sodium drops -- will need to go to ER

## 2020-01-23 NOTE — Patient Instructions (Signed)
Hyponatremia Hyponatremia is when the amount of salt (sodium) in your blood is too low. When sodium levels are low, your cells absorb extra water, which causes them to swell. The swelling happens throughout the body, but it mostly affects the brain. What are the causes? This condition may be caused by:  Certain medical conditions, such as: ? Heart, kidney, or liver problems. ? Thyroid problems. ? Adrenal gland problems. ? Metabolic conditions, such as Addison disease or syndrome of inappropriate antidiuretic hormone (SIADH).  Severe vomiting or diarrhea.  Certain medicines or illegal drugs.  Dehydration.  Drinking too much water.  Eating a diet that is low in sodium.  Large burns on your body.  Excessive sweating. What increases the risk? You are more likely to develop this condition if you:  Have long-term (chronic) kidney disease.  Have heart failure.  Have a medical condition that causes frequent or excessive diarrhea.  Participate in intense physical activities, such as marathon running.  Take certain medicines that affect the sodium and fluid balance in the blood. Some of these medicine types include: ? Diuretics. ? NSAIDs. ? Some opioid pain medicines. ? Some antidepressants. ? Some seizure prevention medicines. What are the signs or symptoms? Symptoms of this condition include:  Headache.  Nausea and vomiting.  Being very tired (lethargic).  Muscle weakness and cramping.  Loss of appetite.  Feeling weak or light-headed. Severe symptoms of this condition include:  Confusion.  Agitation.  Having a rapid heart rate.  Passing out (fainting).  Seizures.  Coma. How is this diagnosed? This condition is diagnosed based on:  A physical exam.  Your medical history.  Tests, including: ? Blood tests. ? Urine tests. How is this treated? Treatment for this condition depends on the cause. Treatment may include:  Getting fluids through an IV  that is inserted into one of your veins.  Medicines to correct the sodium imbalance. If medicines are causing the condition, the medicines will need to be adjusted.  Limiting your water or fluid intake to get the correct sodium balance.  Monitoring in the hospital setting to closely watch your symptoms for improvement. Follow these instructions at home:   Take over-the-counter and prescription medicines only as told by your health care provider. Many medicines can make this condition worse. Talk with your health care provider about any medicines that you are currently taking.  Carefully follow a recommended diet as told by your health care provider.  Carefully follow instructions from your health care provider about fluid restrictions.  Do not drink alcohol.  Keep all follow-up visits as told by your health care provider. This is important. Contact a health care provider if:  You develop worsening nausea, fatigue, headache, confusion, or weakness.  Your symptoms go away and then return.  You have problems following the recommended diet. Get help right away if:  You have a seizure.  You pass out.  You have ongoing diarrhea or vomiting. Summary  Hyponatremia is when the amount of salt (sodium) in your blood is too low.  When sodium levels are low, your cells absorb extra water, which causes them to swell.  The swelling happens throughout the body, but it mostly affects the brain.  Treatment for this condition depends on the cause. It may include IV fluids, medicines, and limiting your fluid intake. This information is not intended to replace advice given to you by your health care provider. Make sure you discuss any questions you have with your health care provider. Document  Revised: 02/26/2018 Document Reviewed: 02/26/2018 Elsevier Patient Education  [employer].

## 2020-01-23 NOTE — Progress Notes (Signed)
Patient ID: Sara Barnes, female    DOB: [birth year]  Age: 79 y.o. MRN: [id number]    Subjective:  Subjective  HPI Sara Barnes presents for f/u sodium per GI.   Pt has chronic loose stools and constantly and drinks water continually due to feeling like food gets stuck and she needs to wash food down.  She states she can drink a 24 pack of water in 1 1/2 days  She has no other complaints   Review of Systems  Constitutional: Negative for appetite change, diaphoresis, fatigue and unexpected weight change.  Eyes: Negative for pain, redness and visual disturbance.  Respiratory: Negative for cough, chest tightness, shortness of breath and wheezing.   Cardiovascular: Negative for chest pain, palpitations and leg swelling.  Endocrine: Negative for cold intolerance, heat intolerance, polydipsia, polyphagia and polyuria.  Genitourinary: Negative for difficulty urinating, dysuria and frequency.  Neurological: Negative for dizziness, light-headedness, numbness and headaches.    History Past Medical History:  Diagnosis Date  . Allergy   . Anemia   . Anxiety   . Arthritis   . Basal cell carcinoma   . Blood transfusion without reported diagnosis   . Bowel perforation ([place])   . 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 Cancer of lower-inner quadrant of left female breast ([place]) 11/30/2014  . Complication of anesthesia    anxiety post-op after ear surgeries  . Crohn's disease of large intestine with abscess ([place])   . Dental crowns present    recent root canal 11/2014  . Depression   . Family history of adverse reaction to anesthesia    pt's sister has hx. of post-op N/V  . GERD (gastroesophageal reflux disease)   . Hyperlipidemia   . Indigestion   . Perianal abscess   . Personal history of radiation therapy 2016  . PONV (postoperative nausea and vomiting)   . Runny nose 12/07/2014   clear drainage, per pt.  . Seasonal allergies   . TMJ (temporomandibular  joint syndrome)   . Ulcerative colitis   . Vitamin B12 deficiency     She has a past surgical history that includes Abdominal hysterectomy; Appendectomy; Breast enhancement surgery; Incision and drainage perirectal abscess (N/A, 09/28/2013); Inner ear surgery (Bilateral); Colonoscopy with propofol (05/28/2011; 06/28/2014); Breast lumpectomy with radioactive seed and sentinel lymph node biopsy (Left, 12/13/2014); Excision basal cell carcinoma (Left); Flexible sigmoidoscopy (N/A, 01/08/2016); Incision and drainage perirectal abscess (N/A, 01/22/2016); Laparoscopic diverted colostomy (N/A, 02/11/2016); Incision and drainage abscess (2018); laparoscopy (N/A, 03/23/2017); Ileostomy (N/A, 05/25/2017); Ileostomy (02/11/2016); ILEOSTOMY DILATION (05/25/2017); Ileostomy closure (N/A, 06/02/2017); 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   Her family history includes Anesthesia problems in her sister; Asthma in her sister; Brain cancer in her father; Diabetes in her brother; Emphysema in her maternal grandfather; Kidney disease in her brother; Prostate cancer (age of onset: 1) in her brother; Rheum arthritis in her brother and mother.She reports that she has never smoked. She has never used smokeless tobacco. She reports current alcohol use of about 7.0 standard drinks of alcohol per week. She reports that she does not use drugs.  Current Outpatient Medications on File Prior to Visit  Medication Sig Dispense Refill  . acetaminophen (TYLENOL) 500 MG tablet You can take 2 tablets every 6 hours as needed for pain.  Use this as your primary pain control.  Use Tramadol as a last resort.  You can buy this over the counter at any drug store.    DO NOT TAKE MORE THAN 4000 MG OF TYLENOL PER DAY.  IT CAN HARM YOUR LIVER. 30 tablet 0  . cyanocobalamin (,VITAMIN B-12,) 1000 MCG/ML injection  INJECT 1 ML INTRAMUSCULARLY EVERY 30 DAYS 3 mL 1  . diphenoxylate-atropine (LOMOTIL) 2.5-0.025 MG tablet Take 1-2 tablets by mouth 3-4 times daily AS NEEDED for severe diarrhea 240 tablet 1  . famotidine (PEPCID) 20 MG tablet Take 1 tablet (20 mg total) by mouth 2 (two) times daily. 180 tablet 1  . FLUoxetine (PROZAC) 20 MG capsule Take 1 capsule (20 mg total) by mouth daily. 90 capsule 1  . fluticasone (FLONASE) 50 MCG/ACT nasal spray Place 2 sprays into both nostrils daily. 16 g 6  . loperamide (IMODIUM) 2 MG capsule Take 1 capsule (2 mg total) by mouth as needed for diarrhea or loose stools. 90 capsule 1  . ondansetron (ZOFRAN-ODT) 4 MG disintegrating tablet DISSOLVE ONE TABLET BY MOUTH EVERY 6 HOURS AS NEEDED FOR NAUSEA AND VOMITING 90 tablet 1  . predniSONE (DELTASONE) 5 MG tablet TAKE ONE TABLET BY MOUTH DAILY 90 tablet 0  . zolpidem (AMBIEN) 10 MG tablet Take 1 tablet (10 mg total) by mouth at bedtime as needed. 30 tablet 0   No current facility-administered medications on file prior to visit.     Objective:  Objective  Physical Exam Vitals and nursing note reviewed.  Constitutional:      Appearance: She is well-developed.  HENT:     Head: Normocephalic and atraumatic.  Eyes:     Conjunctiva/sclera: Conjunctivae normal.  Neck:     Thyroid: No thyromegaly.     Vascular: No carotid bruit or JVD.  Cardiovascular:     Rate and Rhythm: Normal rate and regular rhythm.     Heart sounds: Normal heart sounds. No murmur heard.   Pulmonary:     Effort: Pulmonary effort is normal. No respiratory distress.     Breath sounds: Normal breath sounds. No wheezing or rales.  Chest:     Chest wall: No tenderness.  Musculoskeletal:     Cervical back: Normal range of motion and neck supple.  Neurological:     Mental Status: She is alert and oriented to person, place, and time.    BP 131/80 (BP Location: Left Arm, Patient Position: Sitting, Cuff Size: Small)   Pulse 73   Temp 98.2 F (36.8  C) (Oral)   Resp 18   Ht 5' 6"  (1.676 m)   Wt 157 lb 2 oz (71.3 kg)   SpO2 99%   BMI 25.36 kg/m  Wt Readings from Last 3 Encounters:  01/23/20 157 lb 2 oz (71.3 kg)  01/17/20 153 lb (69.4 kg)  11/02/19 153 lb 2 oz (69.5 kg)     Lab Results  Component Value Date   WBC 9.1 01/17/2020   HGB 13.8 01/17/2020   HCT 40.6 01/17/2020   PLT 257.0 01/17/2020   GLUCOSE 101 (H) 01/23/2020   CHOL 224 (H) 01/07/2017   TRIG 179 (H) 06/01/2017   HDL 94.90 01/07/2017   LDLDIRECT 110.0 01/07/2017   LDLCALC 70 10/02/2016   ALT 8 01/23/2020   AST 13 01/23/2020   NA 134 (L) 01/23/2020   K 4.3 01/23/2020   CL 101 01/23/2020   CREATININE 1.15 (H) 01/23/2020   BUN 15 01/23/2020   CO2 24 01/23/2020   TSH 0.82 01/01/2018   INR 0.9 09/11/2017   HGBA1C 5.5  12/18/2010   MICROALBUR <0.7 01/30/2015    MM DIAG BREAST W/IMPLANT TOMO BILATERAL  Result Date: 12/14/2019 CLINICAL DATA:  S/p lumpectomy and radiation therapy for breast cancer in 2016. EXAM: DIGITAL DIAGNOSTIC BILATERAL MAMMOGRAM WITH IMPLANTS, CAD AND TOMO The patient has retropectoral implants. Standard and implant displaced views were performed. COMPARISON:  Previous exam(s). ACR Breast Density Category b: There are scattered areas of fibroglandular density. FINDINGS: Stable post lumpectomy changes on the left. No interval findings suspicious for malignancy in either breast. Mammographic images were processed with CAD. IMPRESSION: No evidence of malignancy. RECOMMENDATION: Bilateral screening mammogram in 1 year. I have discussed the findings and recommendations with the patient. If applicable, a reminder letter will be sent to the patient regarding the next appointment. BI-RADS CATEGORY  2: Benign. Electronically Signed   By: Claudie Revering M.D.   On: 12/14/2019 12:09     Assessment & Plan:  Plan  I am having [name] maintain her acetaminophen, fluticasone, predniSONE, loperamide, ondansetron, FLUoxetine, cyanocobalamin,  diphenoxylate-atropine, famotidine, and zolpidem.  No orders of the defined types were placed in this encounter.   Problem List Items Addressed This Visit      Unprioritized   Dysphagia    Start taking pepcid again  And f/u GI  ? Stricture       Relevant Orders   Ambulatory referral to Gastroenterology   Hyponatremia - Primary    ? Excessive water intake  She will cut down on water intake and alcohol  Recheck today---- if sodium drops -- will need to go to ER       Relevant Orders   Comprehensive metabolic panel (Completed)   Urinary frequency   Relevant Orders   POCT Urinalysis Dipstick (Automated) (Completed)    Other Visit Diagnoses    Need for influenza vaccination       Relevant Orders   Flu vaccine HIGH DOSE PF (Fluzone High dose) (Completed)   Microscopic hematuria       Relevant Orders   Urine Culture      Follow-up: Return in about 2 weeks (around 02/06/2020), or if symptoms worsen or fail to improve.  Ann Held, DO

## 2020-01-24 ENCOUNTER — Telehealth: Payer: Self-pay | Admitting: Family Medicine

## 2020-01-24 LAB — URINE CULTURE
MICRO NUMBER:: 10999060
SPECIMEN QUALITY:: ADEQUATE

## 2020-01-24 NOTE — Telephone Encounter (Signed)
Patient is requesting a call back in reference to lab results.

## 2020-01-24 NOTE — Telephone Encounter (Signed)
See result notes. 

## 2020-01-25 ENCOUNTER — Other Ambulatory Visit: Payer: Self-pay

## 2020-01-25 DIAGNOSIS — R350 Frequency of micturition: Secondary | ICD-10-CM

## 2020-01-25 DIAGNOSIS — E871 Hypo-osmolality and hyponatremia: Secondary | ICD-10-CM

## 2020-01-25 NOTE — Telephone Encounter (Signed)
Spoke with patient's sister. Advised of results. Lab appt made and labs placed.

## 2020-01-25 NOTE — Telephone Encounter (Signed)
Pt returning call regarding results.

## 2020-02-02 ENCOUNTER — Ambulatory Visit (HOSPITAL_BASED_OUTPATIENT_CLINIC_OR_DEPARTMENT_OTHER)
Admission: RE | Admit: 2020-02-02 | Discharge: 2020-02-02 | Disposition: A | Discharge status: Home / Self Care | Payer: Medicare HMO | Source: Ambulatory Visit | Attending: Medical | Admitting: Medical

## 2020-02-02 ENCOUNTER — Other Ambulatory Visit: Payer: Self-pay

## 2020-02-02 ENCOUNTER — Telehealth: Payer: Self-pay | Admitting: Medical

## 2020-02-02 ENCOUNTER — Ambulatory Visit (INDEPENDENT_AMBULATORY_CARE_PROVIDER_SITE_OTHER): Payer: Medicare HMO | Admitting: Medical

## 2020-02-02 ENCOUNTER — Encounter: Payer: Self-pay | Admitting: Family Medicine

## 2020-02-02 ENCOUNTER — Ambulatory Visit (INDEPENDENT_AMBULATORY_CARE_PROVIDER_SITE_OTHER): Payer: Medicare HMO | Admitting: Family Medicine

## 2020-02-02 VITALS — BP 133/79 | HR 72 | Resp 18 | Ht 66.0 in | Wt 157.0 lb

## 2020-02-02 VITALS — BP 137/81 | HR 90 | Ht 66.0 in | Wt 154.0 lb

## 2020-02-02 DIAGNOSIS — M79609 Pain in unspecified limb: Secondary | ICD-10-CM

## 2020-02-02 DIAGNOSIS — Z043 Encounter for examination and observation following other accident: Secondary | ICD-10-CM | POA: Diagnosis not present

## 2020-02-02 DIAGNOSIS — M25571 Pain in right ankle and joints of right foot: Secondary | ICD-10-CM

## 2020-02-02 DIAGNOSIS — S82451A Displaced comminuted fracture of shaft of right fibula, initial encounter for closed fracture: Secondary | ICD-10-CM | POA: Diagnosis not present

## 2020-02-02 DIAGNOSIS — M25572 Pain in left ankle and joints of left foot: Secondary | ICD-10-CM | POA: Diagnosis not present

## 2020-02-02 DIAGNOSIS — M25561 Pain in right knee: Secondary | ICD-10-CM

## 2020-02-02 DIAGNOSIS — M898X6 Other specified disorders of bone, lower leg: Secondary | ICD-10-CM | POA: Diagnosis not present

## 2020-02-02 DIAGNOSIS — S82434A Nondisplaced oblique fracture of shaft of right fibula, initial encounter for closed fracture: Secondary | ICD-10-CM | POA: Diagnosis not present

## 2020-02-02 DIAGNOSIS — S0093XA Contusion of unspecified part of head, initial encounter: Secondary | ICD-10-CM

## 2020-02-02 DIAGNOSIS — R600 Localized edema: Secondary | ICD-10-CM | POA: Diagnosis not present

## 2020-02-02 DIAGNOSIS — M79671 Pain in right foot: Secondary | ICD-10-CM | POA: Diagnosis not present

## 2020-02-02 DIAGNOSIS — S82401D Unspecified fracture of shaft of right fibula, subsequent encounter for closed fracture with routine healing: Secondary | ICD-10-CM | POA: Insufficient documentation

## 2020-02-02 DIAGNOSIS — S82231A Displaced oblique fracture of shaft of right tibia, initial encounter for closed fracture: Secondary | ICD-10-CM | POA: Diagnosis not present

## 2020-02-02 DIAGNOSIS — M19071 Primary osteoarthritis, right ankle and foot: Secondary | ICD-10-CM | POA: Diagnosis not present

## 2020-02-02 NOTE — Telephone Encounter (Signed)
Patient was seen today at 2:10 pm

## 2020-02-02 NOTE — Patient Instructions (Signed)
Nice to meet you  Please try the rolling walker to help with stability  Please take vitamin D and calcium.  Please send me a message in MyChart with any questions or updates.  Please see me back in 1 week.   --Dr. Raeford Razor

## 2020-02-02 NOTE — Telephone Encounter (Signed)
Can you help with referral to [facility] Dr. Raeford Razor.

## 2020-02-02 NOTE — Assessment & Plan Note (Signed)
Had a fall earlier today. Can dorsal flex her foot.  - counseled on supportive care - CAM walker  - rolling walker  - counseled on weight bearing  - counseled on foot drop and swelling  - f/u in 1 week.

## 2020-02-02 NOTE — Progress Notes (Signed)
Sara Barnes - 79 y.o. female MRN [id number]  Date of birth: Jul 21, 1940  SUBJECTIVE:  Including CC & ROS.  Chief Complaint  Patient presents with  . Leg Injury    right leg/ankle x 01/29/2020    Sara Barnes is a 79 y.o. female that is she is presenting with right leg pain after a fall today. She is having bruising on the lateral aspect of her lower leg. Has some pain with walking. Denies numbness or tingling. .  Independent review of the right knee x-ray from 10/7 shows a comminuted fracture of the proximal fibular shaft.  Independent review of the right ankle x-ray from 10/7 shows no fracture.  Independent review of the right foot x-ray from 10/7 shows no acute changes.  Independent review of the left ankle x-ray from today shows no acute changes.  Independent review of the tibia and fibular x-ray of the right shows   Review of Systems See HPI   HISTORY: Past Medical, Surgical, Social, and Family History Reviewed & Updated per EMR.   Pertinent Historical Findings include:  Past Medical History:  Diagnosis Date  . Allergy   . Anemia   . Anxiety   . Arthritis   . Basal cell carcinoma   . Blood transfusion without reported diagnosis   . Bowel perforation ([place])   . Cancer of lower-inner quadrant of female breast ([place]) 11/30/2014   left  . Cancer of lower-inner quadrant of left female breast ([place]) 11/30/2014  . Complication of anesthesia    anxiety post-op after ear surgeries  . Crohn's disease of large intestine with abscess ([place])   . Dental crowns present    recent root canal 11/2014  . Depression   . Family history of adverse reaction to anesthesia    pt's sister has hx. of post-op N/V  . GERD (gastroesophageal reflux disease)   . Hyperlipidemia   . Indigestion   . Perianal abscess   . Personal history of radiation therapy 2016  . PONV (postoperative nausea and vomiting)   . Runny nose 12/07/2014   clear drainage, per pt.  . Seasonal allergies   . TMJ (temporomandibular  joint syndrome)   . Ulcerative colitis   . Vitamin B12 deficiency     Past Surgical History:  Procedure Laterality Date  . ABDOMINAL HYSTERECTOMY     partial  . APPENDECTOMY    . AUGMENTATION MAMMAPLASTY Bilateral   . BASAL CELL CARCINOMA EXCISION Left    nose  . BREAST BIOPSY Left 2019   benign  . BREAST ENHANCEMENT SURGERY    . BREAST LUMPECTOMY Left 2016  . BREAST LUMPECTOMY WITH RADIOACTIVE SEED AND SENTINEL LYMPH NODE BIOPSY Left 12/13/2014   Procedure: LEFT BREAST LUMPECTOMY WITH RADIOACTIVE SEED AND SENTINEL LYMPH NODE BIOPSY;  Surgeon: [name], MD;  Location: [facility];  Service: General;  Laterality: Left;  . COLONOSCOPY WITH PROPOFOL  05/28/2011; 06/28/2014  . EXPLORATORY LAPAROTOMY  04/08/2018   with end ileostomy takedown and creation of loop ileostomy-- [facility]-Dr Renelda Mom  . FLEXIBLE SIGMOIDOSCOPY N/A 01/08/2016   Procedure: FLEXIBLE SIGMOIDOSCOPY;  Surgeon: [name], MD;  Location: [facility];  Service: Gastroenterology;  Laterality: N/A;  needs MAC due to pain and anxiety  . ILEOSTOMY N/A 05/25/2017   Procedure: DILATION OF ILEOSTOMY;  Surgeon: Rolm Bookbinder, MD;  [facility];  Service: General;  Laterality: N/A;  . ILEOSTOMY  02/11/2016   END ileostomy.  Dr Hassell Done  .  ILEOSTOMY CLOSURE N/A 06/02/2017   Procedure: RESECTION OF ILEAL STRICTURE; ILEOSTOMY REVISION ;  Surgeon: Michael Boston, MD;  [facility];  Service: General;  Laterality: N/A;  . ILEOSTOMY DILATION  05/25/2017   DILITATION OF ILEAL STRICTURE  . ileostomy stricture resection  06/02/2017   with revision  . INCISION AND DRAINAGE ABSCESS  2018  . INCISION AND DRAINAGE PERIRECTAL ABSCESS N/A 09/28/2013   Procedure: IRRIGATION AND DEBRIDEMENT PERIANAL ABSCESS, proctoscopy;  Surgeon: Shann Medal, MD;  [facility];  Service: General;  Laterality: N/A;  . INCISION AND DRAINAGE PERIRECTAL ABSCESS N/A 01/22/2016   Procedure:  IRRIGATION AND DEBRIDEMENT PERIRECTAL ABSCESS;  Surgeon: Armandina Gemma, MD;  [facility];  Service: General;  Laterality: N/A;  . INNER EAR SURGERY Bilateral   . LAPAROSCOPIC DIVERTED COLOSTOMY N/A 02/11/2016   Procedure: LAPAROSCOPIC DIVERTED ILEOSTOMY;  Surgeon: Johnathan Hausen, MD;  [facility];  Service: General;  Laterality: N/A;  . LAPAROSCOPY N/A 03/23/2017   Procedure: LAPAROSCOPY DIAGNOSTIC, EXPLORATORY LAPAROTOMY,  BOWEL RESECTION;  Surgeon: Jackolyn Confer, MD;  [facility];  Service: General;  Laterality: N/A;    Family History  Problem Relation Age of Onset  . Prostate cancer Brother 25  . Kidney disease Brother   . Diabetes Brother   . Brain cancer Father   . Rheum arthritis Mother   . Anesthesia problems Sister        post-op N/V  . Emphysema Maternal Grandfather   . Asthma Sister   . Rheum arthritis Brother   . Esophageal cancer Neg Hx   . Breast cancer Neg Hx   . Rectal cancer Neg Hx   . Stomach cancer Neg Hx   . Colon cancer Neg Hx     Social History   Socioeconomic History  . Marital status: Widowed    Spouse name: Not on file  . Number of children: 1  . Years of education: Not on file  . Highest education level: Not on file  Occupational History  . Occupation: Warden/ranger    Comment: retired at age 73  Tobacco Use  . Smoking status: Never Smoker  . Smokeless tobacco: Never Used  Vaping Use  . Vaping Use: Never used  Substance and Sexual Activity  . Alcohol use: Yes    Alcohol/week: 7.0 standard drinks    Types: 7 Glasses of wine per week  . Drug use: No  . Sexual activity: Not Currently    Partners: Male  Other Topics Concern  . Not on file  Social History Narrative   No exercise due to health   Social Determinants of Health   Financial Resource Strain:   . Difficulty of Paying Living Expenses: Not on file  Food Insecurity:   . Worried About Charity fundraiser in the Last Year: Not on file  . Ran Out of Food in the Last  Year: Not on file  Transportation Needs:   . Lack of Transportation (Medical): Not on file  . Lack of Transportation (Non-Medical): Not on file  Physical Activity:   . Days of Exercise per Week: Not on file  . Minutes of Exercise per Session: Not on file  Stress:   . Feeling of Stress : Not on file  Social Connections:   . Frequency of Communication with Friends and Family: Not on file  . Frequency of Social Gatherings with Friends and Family: Not on file  . Attends Religious Services: Not on file  . Active Member of  Clubs or Organizations: Not on file  . Attends Archivist Meetings: Not on file  . Marital Status: Not on file  Intimate Partner Violence:   . Fear of Current or Ex-Partner: Not on file  . Emotionally Abused: Not on file  . Physically Abused: Not on file  . Sexually Abused: Not on file     PHYSICAL EXAM:  VS: BP 137/81   Pulse 90   Ht _0  (1.676 m)   Wt 154 lb (69.9 kg)   BMI 24.86 kg/m  Physical Exam Gen: NAD, alert, cooperative with exam, well-appearing MSK:  Right leg:  Normal knee range of motion  Normal plantar flexion and dorsal flexion  Ecchymosis and tenderness over lateral aspect of lower leg.  Neurovascularly intact       ASSESSMENT & PLAN:   Closed displaced comminuted fracture of shaft of right fibula Had a fall earlier today. Can dorsal flex her foot.  - counseled on supportive care - CAM walker  - rolling walker  - counseled on weight bearing  - counseled on foot drop and swelling  - f/u in 1 week.

## 2020-02-02 NOTE — Patient Instructions (Addendum)
For your various areas of pain after fall including right knee, tibia, ankles, right foot and popliteal region we are getting imaging studies.  Studies will include x-ray of areas listed and ultrasound of right lower extremity.  Asking radiology staff to do all studies today.  Ultrasound at the latest tomorrow.  But preferably ultrasound today.  Low-dose Tylenol for pain and can add ibuprofen 200 mg if needed for more severe pain.  Infectious and might need to get narcotics.  Rt fibula fx. Put in referral to sports med. Pt explained appointment at 2:10 today and shown location of office by Rod Holler. Will follow sports med note and see treatment plan.  Head contusion. Normal neuro exam. Imaging study of head not indicated.  Low sodium. Not contributing factor to fall. Near nml level recently. Follow repeat date as pcp recommended.  Follow-up date to be determined after x-ray and ultrasound review.

## 2020-02-02 NOTE — Progress Notes (Signed)
Subjective:    Patient ID: Sara Barnes, female    DOB: 07/04/1940, 79 y.o.   MRN: [id number]  HPI  Pt states 3 days ago she fell.   She explains when fell she landed in described hurdler position. Left leg extended out and rt leg was under her. Pt saw salamader in her house. She got scared/startled and fell.   Now has rt knee pain, popliteal pain, bruising lateral calf, rt ankle pain and left ankle pain.   She bumped her rt side head on floor. No loc. No neuro signs or symptoms following bump.  Low sodium history and most recently only one point low. No dizziness or ams prior to fall. Pt clarified lost balanced when started.    Review of Systems  Constitutional: Negative for chills, fatigue and fever.  Respiratory: Negative for cough, chest tightness, shortness of breath and wheezing.   Cardiovascular: Negative for chest pain and palpitations.  Genitourinary: Negative for dysuria.  Musculoskeletal:       See hpi.  Neurological: Negative for dizziness, syncope, weakness, numbness and headaches.  Hematological: Negative for adenopathy. Does not bruise/bleed easily.  Psychiatric/Behavioral: Negative for behavioral problems and confusion.    Past Medical History:  Diagnosis Date  . Allergy   . Anemia   . Anxiety   . Arthritis   . Basal cell carcinoma   . Blood transfusion without reported diagnosis   . Bowel perforation ([place])   . Cancer of lower-inner quadrant of female breast ([place]) 11/30/2014   left  . Cancer of lower-inner quadrant of left female breast ([place]) 11/30/2014  . Complication of anesthesia    anxiety post-op after ear surgeries  . Crohn's disease of large intestine with abscess ([place])   . Dental crowns present    recent root canal 11/2014  . Depression   . Family history of adverse reaction to anesthesia    pt's sister has hx. of post-op N/V  . GERD (gastroesophageal reflux disease)   . Hyperlipidemia   . Indigestion   . Perianal abscess   . Personal  history of radiation therapy 2016  . PONV (postoperative nausea and vomiting)   . Runny nose 12/07/2014   clear drainage, per pt.  . Seasonal allergies   . TMJ (temporomandibular joint syndrome)   . Ulcerative colitis   . Vitamin B12 deficiency      Social History   Socioeconomic History  . Marital status: Widowed    Spouse name: Not on file  . Number of children: 1  . Years of education: Not on file  . Highest education level: Not on file  Occupational History  . Occupation: Warden/ranger    Comment: retired at age 54  Tobacco Use  . Smoking status: Never Smoker  . Smokeless tobacco: Never Used  Vaping Use  . Vaping Use: Never used  Substance and Sexual Activity  . Alcohol use: Yes    Alcohol/week: 7.0 standard drinks    Types: 7 Glasses of wine per week  . Drug use: No  . Sexual activity: Not Currently    Partners: Male  Other Topics Concern  . Not on file  Social History Narrative   No exercise due to health   Social Determinants of Health   Financial Resource Strain:   . Difficulty of Paying Living Expenses: Not on file  Food Insecurity:   . Worried About Charity fundraiser in the Last Year: Not on file  . Ran Out of Food  in the Last Year: Not on file  Transportation Needs:   . Lack of Transportation (Medical): Not on file  . Lack of Transportation (Non-Medical): Not on file  Physical Activity:   . Days of Exercise per Week: Not on file  . Minutes of Exercise per Session: Not on file  Stress:   . Feeling of Stress : Not on file  Social Connections:   . Frequency of Communication with Friends and Family: Not on file  . Frequency of Social Gatherings with Friends and Family: Not on file  . Attends Religious Services: Not on file  . Active Member of Clubs or Organizations: Not on file  . Attends Archivist Meetings: Not on file  . Marital Status: Not on file  Intimate Partner Violence:   . Fear of Current or Ex-Partner: Not on file  .  Emotionally Abused: Not on file  . Physically Abused: Not on file  . Sexually Abused: Not on file    Past Surgical History:  Procedure Laterality Date  . ABDOMINAL HYSTERECTOMY     partial  . APPENDECTOMY    . AUGMENTATION MAMMAPLASTY Bilateral   . BASAL CELL CARCINOMA EXCISION Left    nose  . BREAST BIOPSY Left 2019   benign  . BREAST ENHANCEMENT SURGERY    . BREAST LUMPECTOMY Left 2016  . BREAST LUMPECTOMY WITH RADIOACTIVE SEED AND SENTINEL LYMPH NODE BIOPSY Left 12/13/2014   Procedure: LEFT BREAST LUMPECTOMY WITH RADIOACTIVE SEED AND SENTINEL LYMPH NODE BIOPSY;  Surgeon: [name], MD;  Location: [facility];  Service: General;  Laterality: Left;  . COLONOSCOPY WITH PROPOFOL  05/28/2011; 06/28/2014  . EXPLORATORY LAPAROTOMY  04/08/2018   with end ileostomy takedown and creation of loop ileostomy-- [facility]-Dr Renelda Mom  . FLEXIBLE SIGMOIDOSCOPY N/A 01/08/2016   Procedure: FLEXIBLE SIGMOIDOSCOPY;  Surgeon: [name], MD;  Location: [facility];  Service: Gastroenterology;  Laterality: N/A;  needs MAC due to pain and anxiety  . ILEOSTOMY N/A 05/25/2017   Procedure: DILATION OF ILEOSTOMY;  Surgeon: Rolm Bookbinder, MD;  [facility];  Service: General;  Laterality: N/A;  . ILEOSTOMY  02/11/2016   END ileostomy.  Dr Hassell Done  . ILEOSTOMY CLOSURE N/A 06/02/2017   Procedure: RESECTION OF ILEAL STRICTURE; ILEOSTOMY REVISION ;  Surgeon: Michael Boston, MD;  [facility];  Service: General;  Laterality: N/A;  . ILEOSTOMY DILATION  05/25/2017   DILITATION OF ILEAL STRICTURE  . ileostomy stricture resection  06/02/2017   with revision  . INCISION AND DRAINAGE ABSCESS  2018  . INCISION AND DRAINAGE PERIRECTAL ABSCESS N/A 09/28/2013   Procedure: IRRIGATION AND DEBRIDEMENT PERIANAL ABSCESS, proctoscopy;  Surgeon: Shann Medal, MD;  [facility];  Service: General;  Laterality: N/A;  . INCISION AND DRAINAGE PERIRECTAL ABSCESS  N/A 01/22/2016   Procedure: IRRIGATION AND DEBRIDEMENT PERIRECTAL ABSCESS;  Surgeon: Armandina Gemma, MD;  [facility];  Service: General;  Laterality: N/A;  . INNER EAR SURGERY Bilateral   . LAPAROSCOPIC DIVERTED COLOSTOMY N/A 02/11/2016   Procedure: LAPAROSCOPIC DIVERTED ILEOSTOMY;  Surgeon: Johnathan Hausen, MD;  [facility];  Service: General;  Laterality: N/A;  . LAPAROSCOPY N/A 03/23/2017   Procedure: LAPAROSCOPY DIAGNOSTIC, EXPLORATORY LAPAROTOMY,  BOWEL RESECTION;  Surgeon: Jackolyn Confer, MD;  [facility];  Service: General;  Laterality: N/A;    Family History  Problem Relation Age of Onset  . Prostate cancer Brother 72  . Kidney disease Brother   . Diabetes  Brother   . Brain cancer Father   . Rheum arthritis Mother   . Anesthesia problems Sister        post-op N/V  . Emphysema Maternal Grandfather   . Asthma Sister   . Rheum arthritis Brother   . Esophageal cancer Neg Hx   . Breast cancer Neg Hx   . Rectal cancer Neg Hx   . Stomach cancer Neg Hx   . Colon cancer Neg Hx     Allergies  Allergen Reactions  . Codeine Nausea And Vomiting  . Mesalamine Nausea And Vomiting  . Nsaids     Crohn's disease = NO NSAIDs  . Oxycodone Itching and Rash  . Norco [Hydrocodone-Acetaminophen] Nausea Only  . Other Itching    PERFUMED LOTIONS  Cant have MRI due to ear implant   . Sulfa Antibiotics Rash    Current Outpatient Medications on File Prior to Visit  Medication Sig Dispense Refill  . acetaminophen (TYLENOL) 500 MG tablet You can take 2 tablets every 6 hours as needed for pain.  Use this as your primary pain control.  Use Tramadol as a last resort.  You can buy this over the counter at any drug store.    DO NOT TAKE MORE THAN 4000 MG OF TYLENOL PER DAY.  IT CAN HARM YOUR LIVER. 30 tablet 0  . cyanocobalamin (,VITAMIN B-12,) 1000 MCG/ML injection INJECT 1 ML INTRAMUSCULARLY EVERY 30 DAYS 3 mL 1  . diphenoxylate-atropine (LOMOTIL) 2.5-0.025 MG tablet Take 1-2  tablets by mouth 3-4 times daily AS NEEDED for severe diarrhea 240 tablet 1  . famotidine (PEPCID) 20 MG tablet Take 1 tablet (20 mg total) by mouth 2 (two) times daily. 180 tablet 1  . FLUoxetine (PROZAC) 20 MG capsule Take 1 capsule (20 mg total) by mouth daily. 90 capsule 1  . fluticasone (FLONASE) 50 MCG/ACT nasal spray Place 2 sprays into both nostrils daily. 16 g 6  . loperamide (IMODIUM) 2 MG capsule Take 1 capsule (2 mg total) by mouth as needed for diarrhea or loose stools. 90 capsule 1  . ondansetron (ZOFRAN-ODT) 4 MG disintegrating tablet DISSOLVE ONE TABLET BY MOUTH EVERY 6 HOURS AS NEEDED FOR NAUSEA AND VOMITING 90 tablet 1  . predniSONE (DELTASONE) 5 MG tablet TAKE ONE TABLET BY MOUTH DAILY (Patient not taking: Reported on 02/02/2020) 90 tablet 0  . zolpidem (AMBIEN) 10 MG tablet Take 1 tablet (10 mg total) by mouth at bedtime as needed. 30 tablet 0   No current facility-administered medications on file prior to visit.    BP 133/79   Pulse 72   Resp 18   Ht _0  (1.676 m)   Wt 157 lb (71.2 kg)   SpO2 99%   BMI 25.34 kg/m       Objective:   Physical Exam  General- No acute distress. Pleasant patient. Neck- Full range of motion, no jvd Lungs- Clear, even and unlabored. Heart- regular rate and rhythm. Neurologic- CNII- XII grossly intact. Rt knee- mild pain on palpation. Good rom.  Rt lower ext- lateral calf bruised and mild swollen. Rt popliteal area mild tender to palpation. Rt ankle and rt foot- mild tender to palpation. Left anlkle- not swollen. Mild tender to palpation.      Neurologic Cranial Nerve exam:- CN III-XII intact(No nystagmus), symmetric smile. Drift Test:- No drift. Finger to Nose:- Normal/Intact Strength:- 5/5 equal and symmetric strength both upper and lower extremities.  Head- on palpatoin of scalp. No swelling.Marland Kitchen  Assessment & Plan:  For your various areas of pain after fall including right knee, tibia, ankles, right foot and  popliteal region we are getting imaging studies.  Studies will include x-ray of areas listed and ultrasound of right lower extremity.  Asking radiology staff to do all studies today.  Ultrasound at the latest tomorrow.  But preferably ultrasound today.  Low-dose Tylenol for pain and can add ibuprofen 200 mg if needed for more severe pain.  Infectious and might need to get narcotics.  Rt fibula fx. Put in referral to sports med. Pt explained appointment at 2:10 today and shown location of office by Rod Holler. Will follow sports med note and see treatment plan.  Head contusion. Normal neuro exam. Imaging study of head not indicated.  Low sodium. Not contributing factor to fall. Near nml level recently. Follow repeat date as pcp recommended.  Follow-up date to be determined after x-ray and ultrasound review.  Time spent with patient today was 40  minutes which consisted of chart review, discussing diagnoses, work u,p treatment, referral and documentation.

## 2020-02-03 ENCOUNTER — Encounter: Payer: Medicare HMO | Admitting: Family Medicine

## 2020-02-07 NOTE — Telephone Encounter (Signed)
Patient is requesting to speak with you in reference to her medications she did not provide any other information

## 2020-02-08 MED ORDER — FAMOTIDINE 20 MG PO TABS: 20.0000 mg | ORAL_TABLET | Freq: Two times a day (BID) | ORAL | 0 refills | Status: DC

## 2020-02-08 MED ORDER — FLUOXETINE HCL 20 MG PO CAPS
20.0000 mg | ORAL_CAPSULE | Freq: Every day | ORAL | 0 refills | Status: DC
Start: 2020-02-08 — End: 2020-03-26

## 2020-02-08 MED ORDER — PREDNISONE 5 MG PO TABS
5.0000 mg | ORAL_TABLET | Freq: Every day | ORAL | 0 refills | Status: DC
Start: 2020-02-08 — End: 2020-05-15

## 2020-02-08 MED ORDER — DIPHENOXYLATE-ATROPINE 2.5-0.025 MG PO TABS: ORAL_TABLET | ORAL | 0 refills | Status: DC

## 2020-02-08 MED ORDER — CYANOCOBALAMIN 1000 MCG/ML IJ SOLN: INTRAMUSCULAR | 0 refills | Status: DC

## 2020-02-08 MED ORDER — ONDANSETRON 4 MG PO TBDP: ORAL_TABLET | ORAL | 0 refills | Status: DC

## 2020-02-08 NOTE — Addendum Note (Signed)
Addended by: Larina Bras on: 02/08/2020 10:57 AM   Modules accepted: Orders

## 2020-02-08 NOTE — Telephone Encounter (Signed)
Patient calls indicating that her previous pharmacy did not transfer any of her medications to her new pharmacy, [facility] because they said that the medication she had were not "refilliable." I have given a 3 month supply of prednisone, lomotil, zofran, fluoxetine, famotidine to [facility] for her. In addition, I have asked that they fill her Ambien prescription that is already on hold. They indicate that they do not have this prescription. I have given them verbal authorization for the script.

## 2020-02-09 ENCOUNTER — Other Ambulatory Visit: Payer: Self-pay

## 2020-02-09 ENCOUNTER — Ambulatory Visit (INDEPENDENT_AMBULATORY_CARE_PROVIDER_SITE_OTHER): Payer: Medicare HMO | Admitting: Family Medicine

## 2020-02-09 ENCOUNTER — Ambulatory Visit: Payer: Medicare HMO | Admitting: Family Medicine

## 2020-02-09 ENCOUNTER — Encounter: Payer: Self-pay | Admitting: Family Medicine

## 2020-02-09 ENCOUNTER — Telehealth: Payer: Self-pay | Admitting: Family Medicine

## 2020-02-09 VITALS — BP 120/80 | HR 79 | Temp 99.5°F | Resp 18 | Ht 66.0 in | Wt 158.8 lb

## 2020-02-09 DIAGNOSIS — R350 Frequency of micturition: Secondary | ICD-10-CM

## 2020-02-09 DIAGNOSIS — E871 Hypo-osmolality and hyponatremia: Secondary | ICD-10-CM | POA: Diagnosis not present

## 2020-02-09 DIAGNOSIS — R197 Diarrhea, unspecified: Secondary | ICD-10-CM

## 2020-02-09 DIAGNOSIS — S82491D Other fracture of shaft of right fibula, subsequent encounter for closed fracture with routine healing: Secondary | ICD-10-CM | POA: Diagnosis not present

## 2020-02-09 DIAGNOSIS — E538 Deficiency of other specified B group vitamins: Secondary | ICD-10-CM | POA: Diagnosis not present

## 2020-02-09 DIAGNOSIS — R3129 Other microscopic hematuria: Secondary | ICD-10-CM | POA: Diagnosis not present

## 2020-02-09 LAB — POC URINALSYSI DIPSTICK (AUTOMATED)
Bilirubin, UA: NEGATIVE
Glucose, UA: NEGATIVE
Ketones, UA: NEGATIVE
Leukocytes, UA: NEGATIVE
Nitrite, UA: NEGATIVE
Protein, UA: NEGATIVE
Spec Grav, UA: >=1.03 — AB (ref 1.010–1.025)
Urobilinogen, UA: 0.2 E.U./dL
pH, UA: 5 (ref 5.0–8.0)

## 2020-02-09 NOTE — Assessment & Plan Note (Signed)
Per GI It improves with lomotil / imodium

## 2020-02-09 NOTE — Patient Instructions (Signed)
Hyponatremia Hyponatremia is when the amount of salt (sodium) in your blood is too low. When sodium levels are low, your cells absorb extra water, which causes them to swell. The swelling happens throughout the body, but it mostly affects the brain. What are the causes? This condition may be caused by:  Certain medical conditions, such as: ? Heart, kidney, or liver problems. ? Thyroid problems. ? Adrenal gland problems. ? Metabolic conditions, such as Addison disease or syndrome of inappropriate antidiuretic hormone (SIADH).  Severe vomiting or diarrhea.  Certain medicines or illegal drugs.  Dehydration.  Drinking too much water.  Eating a diet that is low in sodium.  Large burns on your body.  Excessive sweating. What increases the risk? You are more likely to develop this condition if you:  Have long-term (chronic) kidney disease.  Have heart failure.  Have a medical condition that causes frequent or excessive diarrhea.  Participate in intense physical activities, such as marathon running.  Take certain medicines that affect the sodium and fluid balance in the blood. Some of these medicine types include: ? Diuretics. ? NSAIDs. ? Some opioid pain medicines. ? Some antidepressants. ? Some seizure prevention medicines. What are the signs or symptoms? Symptoms of this condition include:  Headache.  Nausea and vomiting.  Being very tired (lethargic).  Muscle weakness and cramping.  Loss of appetite.  Feeling weak or light-headed. Severe symptoms of this condition include:  Confusion.  Agitation.  Having a rapid heart rate.  Passing out (fainting).  Seizures.  Coma. How is this diagnosed? This condition is diagnosed based on:  A physical exam.  Your medical history.  Tests, including: ? Blood tests. ? Urine tests. How is this treated? Treatment for this condition depends on the cause. Treatment may include:  Getting fluids through an IV  that is inserted into one of your veins.  Medicines to correct the sodium imbalance. If medicines are causing the condition, the medicines will need to be adjusted.  Limiting your water or fluid intake to get the correct sodium balance.  Monitoring in the hospital setting to closely watch your symptoms for improvement. Follow these instructions at home:   Take over-the-counter and prescription medicines only as told by your health care provider. Many medicines can make this condition worse. Talk with your health care provider about any medicines that you are currently taking.  Carefully follow a recommended diet as told by your health care provider.  Carefully follow instructions from your health care provider about fluid restrictions.  Do not drink alcohol.  Keep all follow-up visits as told by your health care provider. This is important. Contact a health care provider if:  You develop worsening nausea, fatigue, headache, confusion, or weakness.  Your symptoms go away and then return.  You have problems following the recommended diet. Get help right away if:  You have a seizure.  You pass out.  You have ongoing diarrhea or vomiting. Summary  Hyponatremia is when the amount of salt (sodium) in your blood is too low.  When sodium levels are low, your cells absorb extra water, which causes them to swell.  The swelling happens throughout the body, but it mostly affects the brain.  Treatment for this condition depends on the cause. It may include IV fluids, medicines, and limiting your fluid intake. This information is not intended to replace advice given to you by your health care provider. Make sure you discuss any questions you have with your health care provider. Document  Revised: 02/26/2018 Document Reviewed: 02/26/2018 Elsevier Patient Education  [employer].

## 2020-02-09 NOTE — Progress Notes (Signed)
  Chronic Care Management   Note  02/09/2020 Name: Sara Barnes MRN: [id number] DOB: 26-Jun-1940  Sara Barnes is a 79 y.o. year old female who is a primary care patient of Ann Held, DO. I reached out to [name] by phone today in response to a referral sent by [name]'s PCP, Ann Held, DO.   [name] was given information about Chronic Care Management services today including:  1. CCM service includes personalized support from designated clinical staff supervised by her physician, including individualized plan of care and coordination with other care providers 2. 24/7 contact phone numbers for assistance for urgent and routine care needs. 3. Service will only be billed when office clinical staff spend 20 minutes or more in a month to coordinate care. 4. Only one practitioner may furnish and bill the service in a calendar month. 5. The patient may stop CCM services at any time (effective at the end of the month) by phone call to the office staff.   Patient wishes to consider information provided and/or speak with a member of the care team before deciding about enrollment in care management services.   Follow up plan:   Carley Perdue [employer] Scheduler

## 2020-02-09 NOTE — Progress Notes (Signed)
Patient ID: TIANA SIVERTSON, female    DOB: 06-24-1940  Age: 79 y.o. MRN: [id number]    Subjective:  Subjective  HPI YARIANNA VARBLE presents for f/u sodium and urine.    Since she was here she had a fibula fracture and is seeing sport med  No other complaints   Review of Systems  Constitutional: Negative for appetite change, diaphoresis, fatigue and unexpected weight change.  Eyes: Negative for pain, redness and visual disturbance.  Respiratory: Negative for cough, chest tightness, shortness of breath and wheezing.   Cardiovascular: Negative for chest pain, palpitations and leg swelling.  Endocrine: Negative for cold intolerance, heat intolerance, polydipsia, polyphagia and polyuria.  Genitourinary: Negative for difficulty urinating, dysuria and frequency.  Musculoskeletal: Positive for gait problem.  Neurological: Negative for dizziness, light-headedness, numbness and headaches.    History Past Medical History:  Diagnosis Date  . Allergy   . Anemia   . Anxiety   . Arthritis   . Basal cell carcinoma   . Blood transfusion without reported diagnosis   . Bowel perforation ([place])   . 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 Cancer of lower-inner quadrant of left female breast ([place]) 11/30/2014  . Complication of anesthesia    anxiety post-op after ear surgeries  . Crohn's disease of large intestine with abscess ([place])   . Dental crowns present    recent root canal 11/2014  . Depression   . Family history of adverse reaction to anesthesia    pt's sister has hx. of post-op N/V  . GERD (gastroesophageal reflux disease)   . Hyperlipidemia   . Indigestion   . Perianal abscess   . Personal history of radiation therapy 2016  . PONV (postoperative nausea and vomiting)   . Runny nose 12/07/2014   clear drainage, per pt.  . Seasonal allergies   . TMJ (temporomandibular joint syndrome)   . Ulcerative colitis   . Vitamin B12 deficiency     She has a past  surgical history that includes Abdominal hysterectomy; Appendectomy; Breast enhancement surgery; Incision and drainage perirectal abscess (N/A, 09/28/2013); Inner ear surgery (Bilateral); Colonoscopy with propofol (05/28/2011; 06/28/2014); Breast lumpectomy with radioactive seed and sentinel lymph node biopsy (Left, 12/13/2014); Excision basal cell carcinoma (Left); Flexible sigmoidoscopy (N/A, 01/08/2016); Incision and drainage perirectal abscess (N/A, 01/22/2016); Laparoscopic diverted colostomy (N/A, 02/11/2016); Incision and drainage abscess (2018); laparoscopy (N/A, 03/23/2017); Ileostomy (N/A, 05/25/2017); Ileostomy (02/11/2016); ILEOSTOMY DILATION (05/25/2017); Ileostomy closure (N/A, 06/02/2017); 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   Her family history includes Anesthesia problems in her sister; Asthma in her sister; Brain cancer in her father; Diabetes in her brother; Emphysema in her maternal grandfather; Kidney disease in her brother; Prostate cancer (age of onset: 59) in her brother; Rheum arthritis in her brother and mother.She reports that she has never smoked. She has never used smokeless tobacco. She reports current alcohol use of about 7.0 standard drinks of alcohol per week. She reports that she does not use drugs.  Current Outpatient Medications on File Prior to Visit  Medication Sig Dispense Refill  . acetaminophen (TYLENOL) 500 MG tablet You can take 2 tablets every 6 hours as needed for pain.  Use this as your primary pain control.  Use Tramadol as a last resort.  You can buy this over the counter at any drug store.    DO NOT TAKE MORE THAN 4000  MG OF TYLENOL PER DAY.  IT CAN HARM YOUR LIVER. 30 tablet 0  . cyanocobalamin (,VITAMIN B-12,) 1000 MCG/ML injection INJECT 1 ML INTRAMUSCULARLY EVERY 30 DAYS 3 mL 0  . diphenoxylate-atropine (LOMOTIL)  2.5-0.025 MG tablet Take 1-2 tablets by mouth 3-4 times daily AS NEEDED for severe diarrhea 240 tablet 0  . famotidine (PEPCID) 20 MG tablet Take 1 tablet (20 mg total) by mouth 2 (two) times daily. 180 tablet 0  . FLUoxetine (PROZAC) 20 MG capsule Take 1 capsule (20 mg total) by mouth daily. 90 capsule 0  . fluticasone (FLONASE) 50 MCG/ACT nasal spray Place 2 sprays into both nostrils daily. 16 g 6  . loperamide (IMODIUM) 2 MG capsule Take 1 capsule (2 mg total) by mouth as needed for diarrhea or loose stools. 90 capsule 1  . ondansetron (ZOFRAN-ODT) 4 MG disintegrating tablet DISSOLVE ONE TABLET BY MOUTH EVERY 6 HOURS AS NEEDED FOR NAUSEA AND VOMITING 90 tablet 0  . predniSONE (DELTASONE) 5 MG tablet Take 1 tablet (5 mg total) by mouth daily. 90 tablet 0  . zolpidem (AMBIEN) 10 MG tablet Take 1 tablet (10 mg total) by mouth at bedtime as needed. 30 tablet 0   No current facility-administered medications on file prior to visit.     Objective:  Objective  Physical Exam Vitals and nursing note reviewed.  Constitutional:      Appearance: She is well-developed.  HENT:     Head: Normocephalic and atraumatic.  Eyes:     Conjunctiva/sclera: Conjunctivae normal.  Neck:     Thyroid: No thyromegaly.     Vascular: No carotid bruit or JVD.  Cardiovascular:     Rate and Rhythm: Normal rate and regular rhythm.     Heart sounds: Normal heart sounds. No murmur heard.   Pulmonary:     Effort: Pulmonary effort is normal. No respiratory distress.     Breath sounds: Normal breath sounds. No wheezing or rales.  Chest:     Chest wall: No tenderness.  Musculoskeletal:     Cervical back: Normal range of motion and neck supple.  Neurological:     Mental Status: She is alert and oriented to person, place, and time.    BP 120/80 (BP Location: Right Arm, Patient Position: Sitting, Cuff Size: Normal)   Pulse 79   Temp 99.5 F (37.5 C) (Oral)   Resp 18   Ht 5' 6"  (1.676 m)   Wt 158 lb 12.8 oz (72  kg) Comment: Pt has right leg boot  SpO2 97%   BMI 25.63 kg/m  Wt Readings from Last 3 Encounters:  02/09/20 158 lb 12.8 oz (72 kg)  02/02/20 154 lb (69.9 kg)  02/02/20 157 lb (71.2 kg)     Lab Results  Component Value Date   WBC 9.1 01/17/2020   HGB 13.8 01/17/2020   HCT 40.6 01/17/2020   PLT 257.0 01/17/2020   GLUCOSE 101 (H) 01/23/2020   CHOL 224 (H) 01/07/2017   TRIG 179 (H) 06/01/2017   HDL 94.90 01/07/2017   LDLDIRECT 110.0 01/07/2017   LDLCALC 70 10/02/2016   ALT 8 01/23/2020   AST 13 01/23/2020   NA 134 (L) 01/23/2020   K 4.3 01/23/2020   CL 101 01/23/2020   CREATININE 1.15 (H) 01/23/2020   BUN 15 01/23/2020   CO2 24 01/23/2020   TSH 0.82 01/01/2018   INR 0.9 09/11/2017   HGBA1C 5.5 12/18/2010   MICROALBUR <0.7 01/30/2015    DG Tibia/Fibula Right  Result  Date: 02/02/2020 CLINICAL DATA:  [place] on Monday. EXAM: RIGHT TIBIA AND FIBULA - 2 VIEW COMPARISON:  None. FINDINGS: Acute oblique fracture of the proximal tibial diaphysis with 4 mm anterior displacement. Curvilinear ossific density adjacent to the tip of the lateral malleolus, suspicious for small avulsion fracture. Bone mineralization is normal. Soft tissues are unremarkable. IMPRESSION: 1. Acute mildly displaced oblique fracture of the proximal tibial diaphysis. 2. Probable small avulsion fracture at the tip of the lateral malleolus. Electronically Signed   By: Titus Dubin M.D.   On: 02/02/2020 12:33   DG Ankle Complete Left  Result Date: 02/02/2020 CLINICAL DATA:  Fall EXAM: LEFT ANKLE COMPLETE - 3+ VIEW COMPARISON:  None. FINDINGS: Osteopenia. No acute fracture or dislocation. Enthesophyte of the Achilles tendon and plantar calcaneus. Ankle mortise is preserved. No area of erosion or osseous destruction. No unexpected radiopaque foreign body. Soft tissues are unremarkable. IMPRESSION: 1. No acute fracture or dislocation of the LEFT ankle. Electronically Signed   By: Valentino Saxon MD   On: 02/02/2020  12:40   DG Ankle Complete Right  Result Date: 02/02/2020 CLINICAL DATA:  Pain EXAM: RIGHT ANKLE - COMPLETE 3+ VIEW; RIGHT FOOT COMPLETE - 3+ VIEW COMPARISON:  None. FINDINGS: Osteopenia. No acute fracture or dislocation of the foot or ankle. Undulating contour of the distal fibula likely reflecting sequela of remote prior trauma. Ankle mortise is preserved. Known proximal fibular fracture is not visualized on ankle radiographs. Severe degenerative changes of the first MTP with osseous remodeling and subcortical cyst formation. Enthesophyte of the Achilles tendon and plantar calcaneus. No area of erosion or osseous destruction. No unexpected radiopaque foreign body. Soft tissues are unremarkable. IMPRESSION: 1. No acute fracture or dislocation of the foot or ankle. 2. Severe degenerative changes of the first MTP joint. Electronically Signed   By: Valentino Saxon MD   On: 02/02/2020 12:38   US Venous Img Lower Unilateral Right  Result Date: 02/02/2020 CLINICAL DATA:  Lower extremity pain and edema EXAM: RIGHT LOWER EXTREMITY VENOUS DUPLEX ULTRASOUND TECHNIQUE: Gray-scale sonography with graded compression, as well as color Doppler and duplex ultrasound were performed to evaluate the right lower extremity deep venous system from the level of the common femoral vein and including the common femoral, femoral, profunda femoral, popliteal and calf veins including the posterior tibial, peroneal and gastrocnemius veins when visible. The superficial great saphenous vein was also interrogated. Spectral Doppler was utilized to evaluate flow at rest and with distal augmentation maneuvers in the common femoral, femoral and popliteal veins. COMPARISON:  None. FINDINGS: Contralateral Common Femoral Vein: Respiratory phasicity is normal and symmetric with the symptomatic side. No evidence of thrombus. Normal compressibility. Common Femoral Vein: No evidence of thrombus. Normal compressibility, respiratory phasicity and  response to augmentation. Saphenofemoral Junction: No evidence of thrombus. Normal compressibility and flow on color Doppler imaging. Profunda Femoral Vein: No evidence of thrombus. Normal compressibility and flow on color Doppler imaging. Femoral Vein: No evidence of thrombus. Normal compressibility, respiratory phasicity and response to augmentation. Popliteal Vein: No evidence of thrombus. Normal compressibility, respiratory phasicity and response to augmentation. Calf Veins: No evidence of thrombus. Normal compressibility and flow on color Doppler imaging. Superficial Great Saphenous Vein: No evidence of thrombus. Normal compressibility. Venous Reflux:  None. Other Findings:  None. IMPRESSION: No evidence of deep venous thrombosis in the right lower extremity. Left common femoral vein also patent. Electronically Signed   By: Lowella Grip III M.D.   On: 02/02/2020 12:34   DG Foot  Complete Right  Result Date: 02/02/2020 CLINICAL DATA:  Pain EXAM: RIGHT ANKLE - COMPLETE 3+ VIEW; RIGHT FOOT COMPLETE - 3+ VIEW COMPARISON:  None. FINDINGS: Osteopenia. No acute fracture or dislocation of the foot or ankle. Undulating contour of the distal fibula likely reflecting sequela of remote prior trauma. Ankle mortise is preserved. Known proximal fibular fracture is not visualized on ankle radiographs. Severe degenerative changes of the first MTP with osseous remodeling and subcortical cyst formation. Enthesophyte of the Achilles tendon and plantar calcaneus. No area of erosion or osseous destruction. No unexpected radiopaque foreign body. Soft tissues are unremarkable. IMPRESSION: 1. No acute fracture or dislocation of the foot or ankle. 2. Severe degenerative changes of the first MTP joint. Electronically Signed   By: Valentino Saxon MD   On: 02/02/2020 12:38   DG Knee 3 Views Right  Result Date: 02/02/2020 CLINICAL DATA:  Pain after fall EXAM: RIGHT KNEE - 3 VIEW COMPARISON:  None. FINDINGS: Osteopenia. There  is a comminuted fracture of the proximal fibular shaft. No acute fracture of the knee joint. Enthesophyte of the quadriceps tendon insertion on the patella. Soft tissues are unremarkable. IMPRESSION: 1. Comminuted fracture of the proximal fibular shaft. 2. No acute fracture of the knee joint. Electronically Signed   By: Valentino Saxon MD   On: 02/02/2020 12:35     Assessment & Plan:  Plan  I am having [name] maintain her acetaminophen, fluticasone, loperamide, zolpidem, cyanocobalamin, diphenoxylate-atropine, famotidine, FLUoxetine, ondansetron, and predniSONE.  No orders of the defined types were placed in this encounter.   Problem List Items Addressed This Visit      Unprioritized   Closed fracture of shaft of right fibula with routine healing - Primary    F/u ortho/ sport med      Relevant Orders   TSH   Diarrhea    Per GI It improves with lomotil / imodium       Hyponatremia    Recheck labs today      Relevant Orders   Comprehensive metabolic panel   TSH   Urinary frequency   Relevant Orders   POCT Urinalysis Dipstick (Automated)    Other Visit Diagnoses    Vitamin B12 deficiency       Relevant Orders   Vitamin B12   CBC with Differential/Platelet      Follow-up: Return if symptoms worsen or fail to improve+, for as scheduled .  Ann Held, DO

## 2020-02-09 NOTE — Assessment & Plan Note (Signed)
Recheck labs today. 

## 2020-02-09 NOTE — Assessment & Plan Note (Signed)
F/u ortho/ sport med

## 2020-02-10 LAB — CBC WITH DIFFERENTIAL/PLATELET
Absolute Monocytes: 779 cells/uL (ref 200–950)
Basophils Absolute: 57 cells/uL (ref 0–200)
Basophils Relative: 0.6 %
Eosinophils Absolute: 57 cells/uL (ref 15–500)
Eosinophils Relative: 0.6 %
HCT: 41.4 % (ref 35.0–45.0)
Hemoglobin: 13.9 g/dL (ref 11.7–15.5)
Lymphs Abs: 2223 cells/uL (ref 850–3900)
MCH: 31.8 pg (ref 27.0–33.0)
MCHC: 33.6 g/dL (ref 32.0–36.0)
MCV: 94.7 fL (ref 80.0–100.0)
MPV: 10.7 fL (ref 7.5–12.5)
Monocytes Relative: 8.2 %
Neutro Abs: 6384 cells/uL (ref 1500–7800)
Neutrophils Relative %: 67.2 %
Platelets: 313 10*3/uL (ref 140–400)
RBC: 4.37 10*6/uL (ref 3.80–5.10)
RDW: 11.4 % (ref 11.0–15.0)
Total Lymphocyte: 23.4 %
WBC: 9.5 10*3/uL (ref 3.8–10.8)

## 2020-02-10 LAB — COMPREHENSIVE METABOLIC PANEL
AG Ratio: 1.6 (calc) (ref 1.0–2.5)
ALT: 7 U/L (ref 6–29)
AST: 14 U/L (ref 10–35)
Albumin: 4 g/dL (ref 3.6–5.1)
Alkaline phosphatase (APISO): 103 U/L (ref 37–153)
BUN: 14 mg/dL (ref 7–25)
CO2: 24 mmol/L (ref 20–32)
Calcium: 9.3 mg/dL (ref 8.6–10.4)
Chloride: 101 mmol/L (ref 98–110)
Creat: 0.89 mg/dL (ref 0.60–0.93)
Globulin: 2.5 g/dL (calc) (ref 1.9–3.7)
Glucose, Bld: 93 mg/dL (ref 65–99)
Potassium: 4.3 mmol/L (ref 3.5–5.3)
Sodium: 135 mmol/L (ref 135–146)
Total Bilirubin: 0.7 mg/dL (ref 0.2–1.2)
Total Protein: 6.5 g/dL (ref 6.1–8.1)

## 2020-02-10 LAB — URINE CULTURE
MICRO NUMBER:: 11072433
SPECIMEN QUALITY:: ADEQUATE

## 2020-02-10 LAB — VITAMIN B12: Vitamin B-12: 521 pg/mL (ref 200–1100)

## 2020-02-10 LAB — TSH: TSH: 1.42 mIU/L (ref 0.40–4.50)

## 2020-02-13 ENCOUNTER — Other Ambulatory Visit: Payer: Self-pay

## 2020-02-13 ENCOUNTER — Ambulatory Visit: Payer: Medicare HMO | Admitting: Family Medicine

## 2020-02-13 ENCOUNTER — Encounter: Payer: Self-pay | Admitting: Family Medicine

## 2020-02-13 ENCOUNTER — Ambulatory Visit (HOSPITAL_BASED_OUTPATIENT_CLINIC_OR_DEPARTMENT_OTHER)
Admission: RE | Admit: 2020-02-13 | Discharge: 2020-02-13 | Disposition: A | Discharge status: Home / Self Care | Payer: Medicare HMO | Source: Ambulatory Visit | Attending: Family Medicine | Admitting: Family Medicine

## 2020-02-13 DIAGNOSIS — S82451D Displaced comminuted fracture of shaft of right fibula, subsequent encounter for closed fracture with routine healing: Secondary | ICD-10-CM | POA: Insufficient documentation

## 2020-02-13 DIAGNOSIS — S82431A Displaced oblique fracture of shaft of right fibula, initial encounter for closed fracture: Secondary | ICD-10-CM | POA: Diagnosis not present

## 2020-02-13 NOTE — Assessment & Plan Note (Addendum)
Injury occurred on 10/7.  Strength and sensation are intact.  The boot may be causing her some irritation in the hip. -Counseled on home exercise therapy and supportive care. -X-ray. -Could consider physical therapy. -Follow-up in 2 weeks.

## 2020-02-13 NOTE — Progress Notes (Signed)
Sara Barnes - 79 y.o. female MRN [id number]  Date of birth: 10-26-1940  SUBJECTIVE:  Including CC & ROS.  Chief Complaint  Patient presents with  . Follow-up    right leg    Sara Barnes is a 79 y.o. female that is following up after the fracture of the right leg.  She is having some altered sensation in the lateral aspect of the right foot and pain around the right ankle.  Her swelling and bruising has improved.   Review of Systems See HPI   HISTORY: Past Medical, Surgical, Social, and Family History Reviewed & Updated per EMR.   Pertinent Historical Findings include:  Past Medical History:  Diagnosis Date  . Allergy   . Anemia   . Anxiety   . Arthritis   . Basal cell carcinoma   . Blood transfusion without reported diagnosis   . Bowel perforation ([place])   . Cancer of lower-inner quadrant of female breast ([place]) 11/30/2014   left  . Cancer of lower-inner quadrant of left female breast ([place]) 11/30/2014  . Complication of anesthesia    anxiety post-op after ear surgeries  . Crohn's disease of large intestine with abscess ([place])   . Dental crowns present    recent root canal 11/2014  . Depression   . Family history of adverse reaction to anesthesia    pt's sister has hx. of post-op N/V  . GERD (gastroesophageal reflux disease)   . Hyperlipidemia   . Indigestion   . Perianal abscess   . Personal history of radiation therapy 2016  . PONV (postoperative nausea and vomiting)   . Runny nose 12/07/2014   clear drainage, per pt.  . Seasonal allergies   . TMJ (temporomandibular joint syndrome)   . Ulcerative colitis   . Vitamin B12 deficiency     Past Surgical History:  Procedure Laterality Date  . ABDOMINAL HYSTERECTOMY     partial  . APPENDECTOMY    . AUGMENTATION MAMMAPLASTY Bilateral   . BASAL CELL CARCINOMA EXCISION Left    nose  . BREAST BIOPSY Left 2019   benign  . BREAST ENHANCEMENT SURGERY    . BREAST LUMPECTOMY Left 2016  . BREAST LUMPECTOMY WITH RADIOACTIVE  SEED AND SENTINEL LYMPH NODE BIOPSY Left 12/13/2014   Procedure: LEFT BREAST LUMPECTOMY WITH RADIOACTIVE SEED AND SENTINEL LYMPH NODE BIOPSY;  Surgeon: [name], MD;  Location: [facility];  Service: General;  Laterality: Left;  . COLONOSCOPY WITH PROPOFOL  05/28/2011; 06/28/2014  . EXPLORATORY LAPAROTOMY  04/08/2018   with end ileostomy takedown and creation of loop ileostomy-- [facility]-Dr Renelda Mom  . FLEXIBLE SIGMOIDOSCOPY N/A 01/08/2016   Procedure: FLEXIBLE SIGMOIDOSCOPY;  Surgeon: [name], MD;  Location: [facility];  Service: Gastroenterology;  Laterality: N/A;  needs MAC due to pain and anxiety  . ILEOSTOMY N/A 05/25/2017   Procedure: DILATION OF ILEOSTOMY;  Surgeon: Rolm Bookbinder, MD;  Location: [facility];  Service: General;  Laterality: N/A;  . ILEOSTOMY  02/11/2016   END ileostomy.  Dr Hassell Done  . ILEOSTOMY CLOSURE N/A 06/02/2017   Procedure: RESECTION OF ILEAL STRICTURE; ILEOSTOMY REVISION ;  Surgeon: Michael Boston, MD;  Location: [facility];  Service: General;  Laterality: N/A;  . ILEOSTOMY DILATION  05/25/2017   DILITATION OF ILEAL STRICTURE  . ileostomy stricture resection  06/02/2017   with revision  . INCISION AND DRAINAGE ABSCESS  2018  . INCISION AND DRAINAGE PERIRECTAL ABSCESS N/A 09/28/2013   Procedure:  IRRIGATION AND DEBRIDEMENT PERIANAL ABSCESS, proctoscopy;  Surgeon: Shann Medal, MD;  Location: [facility];  Service: General;  Laterality: N/A;  . INCISION AND DRAINAGE PERIRECTAL ABSCESS N/A 01/22/2016   Procedure: IRRIGATION AND DEBRIDEMENT PERIRECTAL ABSCESS;  Surgeon: Armandina Gemma, MD;  Location: [facility];  Service: General;  Laterality: N/A;  . INNER EAR SURGERY Bilateral   . LAPAROSCOPIC DIVERTED COLOSTOMY N/A 02/11/2016   Procedure: LAPAROSCOPIC DIVERTED ILEOSTOMY;  Surgeon: Johnathan Hausen, MD;  Location: [facility];  Service: General;  Laterality: N/A;  . LAPAROSCOPY N/A 03/23/2017   Procedure: LAPAROSCOPY DIAGNOSTIC,  EXPLORATORY LAPAROTOMY,  BOWEL RESECTION;  Surgeon: Jackolyn Confer, MD;  Location: [facility];  Service: General;  Laterality: N/A;    Family History  Problem Relation Age of Onset  . Prostate cancer Brother 73  . Kidney disease Brother   . Diabetes Brother   . Brain cancer Father   . Rheum arthritis Mother   . Anesthesia problems Sister        post-op N/V  . Emphysema Maternal Grandfather   . Asthma Sister   . Rheum arthritis Brother   . Esophageal cancer Neg Hx   . Breast cancer Neg Hx   . Rectal cancer Neg Hx   . Stomach cancer Neg Hx   . Colon cancer Neg Hx     Social History   Socioeconomic History  . Marital status: Widowed    Spouse name: Not on file  . Number of children: 1  . Years of education: Not on file  . Highest education level: Not on file  Occupational History  . Occupation: Warden/ranger    Comment: retired at age 28  Tobacco Use  . Smoking status: Never Smoker  . Smokeless tobacco: Never Used  Vaping Use  . Vaping Use: Never used  Substance and Sexual Activity  . Alcohol use: Yes    Alcohol/week: 7.0 standard drinks    Types: 7 Glasses of wine per week  . Drug use: No  . Sexual activity: Not Currently    Partners: Male  Other Topics Concern  . Not on file  Social History Narrative   No exercise due to health   Social Determinants of Health   Financial Resource Strain:   . Difficulty of Paying Living Expenses: Not on file  Food Insecurity:   . Worried About Charity fundraiser in the Last Year: Not on file  . Ran Out of Food in the Last Year: Not on file  Transportation Needs:   . Lack of Transportation (Medical): Not on file  . Lack of Transportation (Non-Medical): Not on file  Physical Activity:   . Days of Exercise per Week: Not on file  . Minutes of Exercise per Session: Not on file  Stress:   . Feeling of Stress : Not on file  Social Connections:   . Frequency of Communication with Friends and Family: Not on file  . Frequency of  Social Gatherings with Friends and Family: Not on file  . Attends Religious Services: Not on file  . Active Member of Clubs or Organizations: Not on file  . Attends Archivist Meetings: Not on file  . Marital Status: Not on file  Intimate Partner Violence:   . Fear of Current or Ex-Partner: Not on file  . Emotionally Abused: Not on file  . Physically Abused: Not on file  . Sexually Abused: Not on file     PHYSICAL EXAM:  VS: BP 135/83   Pulse  85   Ht _0  (1.676 m)   Wt 147 lb (66.7 kg)   BMI 23.73 kg/m  Physical Exam Gen: NAD, alert, cooperative with exam, well-appearing MSK:  Right leg: No swelling or ecchymosis. Some tenderness palpation of the proximal fibula. Normal knee range of motion. Normal ankle range of motion. Normal strength resistance with dorsiflexion. Neurovascularly intact     ASSESSMENT & PLAN:   Closed fracture of shaft of right fibula with routine healing Injury occurred on 10/7.  Strength and sensation are intact.  The boot may be causing her some irritation in the hip. -Counseled on home exercise therapy and supportive care. -X-ray. -Could consider physical therapy. -Follow-up in 2 weeks.

## 2020-02-14 ENCOUNTER — Telehealth: Payer: Self-pay | Admitting: Family Medicine

## 2020-02-14 NOTE — Telephone Encounter (Signed)
CallerIchelle Barnes Call Back # [phone]   Pt called stating she has requested a call back from clinical staff regarding her lab results and would like to hear back as soon as possible... Please advise

## 2020-02-14 NOTE — Telephone Encounter (Signed)
Spoke with patient. Pt advised of labs

## 2020-02-14 NOTE — Telephone Encounter (Signed)
Informed of results.   Rosemarie Ax, MD [facility] 02/14/2020, 8:33 AM

## 2020-02-15 ENCOUNTER — Other Ambulatory Visit: Payer: Medicare HMO

## 2020-02-16 ENCOUNTER — Telehealth: Payer: Self-pay | Admitting: Family Medicine

## 2020-02-27 ENCOUNTER — Ambulatory Visit: Payer: Medicare HMO | Admitting: Family Medicine

## 2020-02-27 ENCOUNTER — Encounter: Payer: Self-pay | Admitting: Family Medicine

## 2020-02-27 ENCOUNTER — Other Ambulatory Visit: Payer: Self-pay

## 2020-02-27 DIAGNOSIS — S82451D Displaced comminuted fracture of shaft of right fibula, subsequent encounter for closed fracture with routine healing: Secondary | ICD-10-CM

## 2020-02-27 NOTE — Assessment & Plan Note (Signed)
Injury occurred on 10/7.  Has been doing well with immobilization. -Counseled on home exercise therapy and supportive care. -Discontinue CAM Walker today. -Could consider physical therapy. -Follow-up in 3 weeks and reimage at that time.

## 2020-02-27 NOTE — Progress Notes (Signed)
Sara Barnes - 79 y.o. female MRN [id number]  Date of birth: [birth year]  SUBJECTIVE:  Including CC & ROS.  Chief Complaint  Patient presents with  . Follow-up    right leg    ETHELL BLATCHFORD is a 79 y.o. female that is following up for her right fibular fracture.  She is feeling well and denies any significant pain.  Has been wearing the cam walker.   Review of Systems See HPI   HISTORY: Past Medical, Surgical, Social, and Family History Reviewed & Updated per EMR.   Pertinent Historical Findings include:  Past Medical History:  Diagnosis Date  . Allergy   . Anemia   . Anxiety   . Arthritis   . Basal cell carcinoma   . Blood transfusion without reported diagnosis   . Bowel perforation ([place])   . Cancer of lower-inner quadrant of female breast ([place]) 11/30/2014   left  . Cancer of lower-inner quadrant of left female breast ([place]) 11/30/2014  . Complication of anesthesia    anxiety post-op after ear surgeries  . Crohn's disease of large intestine with abscess ([place])   . Dental crowns present    recent root canal 11/2014  . Depression   . Family history of adverse reaction to anesthesia    pt's sister has hx. of post-op N/V  . GERD (gastroesophageal reflux disease)   . Hyperlipidemia   . Indigestion   . Perianal abscess   . Personal history of radiation therapy 2016  . PONV (postoperative nausea and vomiting)   . Runny nose 12/07/2014   clear drainage, per pt.  . Seasonal allergies   . TMJ (temporomandibular joint syndrome)   . Ulcerative colitis   . Vitamin B12 deficiency     Past Surgical History:  Procedure Laterality Date  . ABDOMINAL HYSTERECTOMY     partial  . APPENDECTOMY    . AUGMENTATION MAMMAPLASTY Bilateral   . BASAL CELL CARCINOMA EXCISION Left    nose  . BREAST BIOPSY Left 2019   benign  . BREAST ENHANCEMENT SURGERY    . BREAST LUMPECTOMY Left 2016  . BREAST LUMPECTOMY WITH RADIOACTIVE SEED AND SENTINEL LYMPH NODE BIOPSY Left 12/13/2014   Procedure:  LEFT BREAST LUMPECTOMY WITH RADIOACTIVE SEED AND SENTINEL LYMPH NODE BIOPSY;  Surgeon: [name], MD;  Location: [place] [facility] [place];  Service: General;  Laterality: Left;  . COLONOSCOPY WITH PROPOFOL  05/28/2011; 06/28/2014  . EXPLORATORY LAPAROTOMY  04/08/2018   with end ileostomy takedown and creation of loop ileostomy-- [facility]-Dr Renelda Mom  . FLEXIBLE SIGMOIDOSCOPY N/A 01/08/2016   Procedure: FLEXIBLE SIGMOIDOSCOPY;  Surgeon: [name], MD;  Location: [facility];  Service: Gastroenterology;  Laterality: N/A;  needs MAC due to pain and anxiety  . ILEOSTOMY N/A 05/25/2017   Procedure: DILATION OF ILEOSTOMY;  Surgeon: Rolm Bookbinder, MD;  Location: [facility];  Service: General;  Laterality: N/A;  . ILEOSTOMY  02/11/2016   END ileostomy.  Dr Hassell Done  . ILEOSTOMY CLOSURE N/A 06/02/2017   Procedure: RESECTION OF ILEAL STRICTURE; ILEOSTOMY REVISION ;  Surgeon: Michael Boston, MD;  [facility];  Service: General;  Laterality: N/A;  . ILEOSTOMY DILATION  05/25/2017   DILITATION OF ILEAL STRICTURE  . ileostomy stricture resection  06/02/2017   with revision  . INCISION AND DRAINAGE ABSCESS  2018  . INCISION AND DRAINAGE PERIRECTAL ABSCESS N/A 09/28/2013   Procedure: IRRIGATION AND DEBRIDEMENT PERIANAL ABSCESS, proctoscopy;  Surgeon: Shann Medal, MD;  [facility]: [facility];  Service: General;  Laterality: N/A;  . INCISION AND DRAINAGE PERIRECTAL ABSCESS N/A 01/22/2016   Procedure: IRRIGATION AND DEBRIDEMENT PERIRECTAL ABSCESS;  Surgeon: Armandina Gemma, MD;  [facility];  Service: General;  Laterality: N/A;  . INNER EAR SURGERY Bilateral   . LAPAROSCOPIC DIVERTED COLOSTOMY N/A 02/11/2016   Procedure: LAPAROSCOPIC DIVERTED ILEOSTOMY;  Surgeon: Johnathan Hausen, MD;  [facility];  Service: General;  Laterality: N/A;  . LAPAROSCOPY N/A 03/23/2017   Procedure: LAPAROSCOPY DIAGNOSTIC, EXPLORATORY LAPAROTOMY,  BOWEL RESECTION;  Surgeon: Jackolyn Confer,  MD;  [facility];  Service: General;  Laterality: N/A;    Family History  Problem Relation Age of Onset  . Prostate cancer Brother 48  . Kidney disease Brother   . Diabetes Brother   . Brain cancer Father   . Rheum arthritis Mother   . Anesthesia problems Sister        post-op N/V  . Emphysema Maternal Grandfather   . Asthma Sister   . Rheum arthritis Brother   . Esophageal cancer Neg Hx   . Breast cancer Neg Hx   . Rectal cancer Neg Hx   . Stomach cancer Neg Hx   . Colon cancer Neg Hx     Social History   Socioeconomic History  . Marital status: Widowed    Spouse name: Not on file  . Number of children: 1  . Years of education: Not on file  . Highest education level: Not on file  Occupational History  . Occupation: Warden/ranger    Comment: retired at age 21  Tobacco Use  . Smoking status: Never Smoker  . Smokeless tobacco: Never Used  Vaping Use  . Vaping Use: Never used  Substance and Sexual Activity  . Alcohol use: Yes    Alcohol/week: 7.0 standard drinks    Types: 7 Glasses of wine per week  . Drug use: No  . Sexual activity: Not Currently    Partners: Male  Other Topics Concern  . Not on file  Social History Narrative   No exercise due to health   Social Determinants of Health   Financial Resource Strain:   . Difficulty of Paying Living Expenses: Not on file  Food Insecurity:   . Worried About Charity fundraiser in the Last Year: Not on file  . Ran Out of Food in the Last Year: Not on file  Transportation Needs:   . Lack of Transportation (Medical): Not on file  . Lack of Transportation (Non-Medical): Not on file  Physical Activity:   . Days of Exercise per Week: Not on file  . Minutes of Exercise per Session: Not on file  Stress:   . Feeling of Stress : Not on file  Social Connections:   . Frequency of Communication with Friends and Family: Not on file  . Frequency of Social Gatherings with Friends and Family: Not on file  . Attends  Religious Services: Not on file  . Active Member of Clubs or Organizations: Not on file  . Attends Archivist Meetings: Not on file  . Marital Status: Not on file  Intimate Partner Violence:   . Fear of Current or Ex-Partner: Not on file  . Emotionally Abused: Not on file  . Physically Abused: Not on file  . Sexually Abused: Not on file     PHYSICAL EXAM:  VS: BP 119/77   Pulse 81   Ht _0  (1.676 m)   Wt 147 lb (  66.7 kg)   BMI 23.73 kg/m  Physical Exam Gen: NAD, alert, cooperative with exam, well-appearing MSK:  Right leg: Some tenderness to palpation over the proximal fibula. Normal ankle range of motion. Normal knee range of motion. No pain with weightbearing. Neurovascularly intact     ASSESSMENT & PLAN:   Closed fracture of shaft of right fibula with routine healing Injury occurred on 10/7.  Has been doing well with immobilization. -Counseled on home exercise therapy and supportive care. -Discontinue CAM Walker today. -Could consider physical therapy. -Follow-up in 3 weeks and reimage at that time.   ,

## 2020-02-27 NOTE — Patient Instructions (Signed)
Good to see you You can stop the boot. You may have to use it as needed if the pain spikes  Please try the strengthening and range of motion movements   Please send me a message in MyChart with any questions or updates.  Please see me back in 3 weeks.   --Dr. Raeford Razor

## 2020-03-20 ENCOUNTER — Telehealth: Payer: Self-pay | Admitting: Internal Medicine

## 2020-03-20 DIAGNOSIS — D84821 Immunodeficiency due to drugs: Secondary | ICD-10-CM

## 2020-03-20 MED ORDER — ZOLPIDEM TARTRATE 10 MG PO TABS: 10.0000 mg | ORAL_TABLET | Freq: Every evening | ORAL | 0 refills | Status: DC | PRN

## 2020-03-20 NOTE — Telephone Encounter (Signed)
Rx has been sent to pharmacy

## 2020-03-20 NOTE — Telephone Encounter (Signed)
Pt is requesting a refill on her AMBIEN.   [facility]

## 2020-03-26 ENCOUNTER — Encounter: Payer: Self-pay | Admitting: Family Medicine

## 2020-03-26 ENCOUNTER — Ambulatory Visit: Payer: Medicare HMO | Admitting: Family Medicine

## 2020-03-26 ENCOUNTER — Other Ambulatory Visit: Payer: Self-pay

## 2020-03-26 ENCOUNTER — Ambulatory Visit (INDEPENDENT_AMBULATORY_CARE_PROVIDER_SITE_OTHER): Payer: Medicare HMO | Admitting: Family Medicine

## 2020-03-26 VITALS — BP 125/81 | HR 73 | Ht 66.0 in | Wt 147.0 lb

## 2020-03-26 VITALS — BP 120/70 | HR 71 | Temp 97.8°F | Resp 18 | Ht 66.0 in | Wt 155.2 lb

## 2020-03-26 DIAGNOSIS — M85852 Other specified disorders of bone density and structure, left thigh: Secondary | ICD-10-CM | POA: Diagnosis not present

## 2020-03-26 DIAGNOSIS — S82451D Displaced comminuted fracture of shaft of right fibula, subsequent encounter for closed fracture with routine healing: Secondary | ICD-10-CM

## 2020-03-26 DIAGNOSIS — E871 Hypo-osmolality and hyponatremia: Secondary | ICD-10-CM | POA: Diagnosis not present

## 2020-03-26 DIAGNOSIS — Z85828 Personal history of other malignant neoplasm of skin: Secondary | ICD-10-CM

## 2020-03-26 DIAGNOSIS — Z8679 Personal history of other diseases of the circulatory system: Secondary | ICD-10-CM | POA: Diagnosis not present

## 2020-03-26 DIAGNOSIS — R350 Frequency of micturition: Secondary | ICD-10-CM

## 2020-03-26 DIAGNOSIS — Z0000 Encounter for general adult medical examination without abnormal findings: Secondary | ICD-10-CM | POA: Diagnosis not present

## 2020-03-26 DIAGNOSIS — R3129 Other microscopic hematuria: Secondary | ICD-10-CM | POA: Diagnosis not present

## 2020-03-26 LAB — POC URINALSYSI DIPSTICK (AUTOMATED)
Bilirubin, UA: NEGATIVE
Glucose, UA: NEGATIVE
Ketones, UA: NEGATIVE
Leukocytes, UA: NEGATIVE
Nitrite, UA: NEGATIVE
Protein, UA: POSITIVE — AB
Spec Grav, UA: 1.025 (ref 1.010–1.025)
Urobilinogen, UA: 0.2 E.U./dL
pH, UA: 5.5 (ref 5.0–8.0)

## 2020-03-26 MED ORDER — FLUOXETINE HCL 20 MG PO CAPS
20.0000 mg | ORAL_CAPSULE | Freq: Every day | ORAL | 3 refills | Status: DC
Start: 2020-03-26 — End: 2021-01-16

## 2020-03-26 NOTE — Progress Notes (Signed)
Sara Barnes - 79 y.o. female MRN [id number]  Date of birth: [birth year]  SUBJECTIVE:  Including CC & ROS.  Chief Complaint  Patient presents with  . Follow-up    right leg    Sara Barnes is a 79 y.o. female that is following up for her right fibular fracture.  Her function and movement have been better.  She still has pain intermittently.   Review of Systems See HPI   HISTORY: Past Medical, Surgical, Social, and Family History Reviewed & Updated per EMR.   Pertinent Historical Findings include:  Past Medical History:  Diagnosis Date  . Allergy   . Anemia   . Anxiety   . Arthritis   . Basal cell carcinoma   . Blood transfusion without reported diagnosis   . Bowel perforation ([place])   . Cancer of lower-inner quadrant of female breast ([place]) 11/30/2014   left  . Cancer of lower-inner quadrant of left female breast ([place]) 11/30/2014  . Complication of anesthesia    anxiety post-op after ear surgeries  . Crohn's disease of large intestine with abscess ([place])   . Dental crowns present    recent root canal 11/2014  . Depression   . Family history of adverse reaction to anesthesia    pt's sister has hx. of post-op N/V  . GERD (gastroesophageal reflux disease)   . Hyperlipidemia   . Indigestion   . Perianal abscess   . Personal history of radiation therapy 2016  . PONV (postoperative nausea and vomiting)   . Runny nose 12/07/2014   clear drainage, per pt.  . Seasonal allergies   . TMJ (temporomandibular joint syndrome)   . Ulcerative colitis   . Vitamin B12 deficiency     Past Surgical History:  Procedure Laterality Date  . ABDOMINAL HYSTERECTOMY     partial  . APPENDECTOMY    . AUGMENTATION MAMMAPLASTY Bilateral   . BASAL CELL CARCINOMA EXCISION Left    nose  . BREAST BIOPSY Left 2019   benign  . BREAST ENHANCEMENT SURGERY    . BREAST LUMPECTOMY Left 2016  . BREAST LUMPECTOMY WITH RADIOACTIVE SEED AND SENTINEL LYMPH NODE BIOPSY Left 12/13/2014   Procedure: LEFT  BREAST LUMPECTOMY WITH RADIOACTIVE SEED AND SENTINEL LYMPH NODE BIOPSY;  Surgeon: [name], MD;  Location: [facility];  Service: General;  Laterality: Left;  . COLONOSCOPY WITH PROPOFOL  05/28/2011; 06/28/2014  . EXPLORATORY LAPAROTOMY  04/08/2018   with end ileostomy takedown and creation of loop ileostomy-- [facility]-Dr Renelda Mom  . FLEXIBLE SIGMOIDOSCOPY N/A 01/08/2016   Procedure: FLEXIBLE SIGMOIDOSCOPY;  Surgeon: [name], MD;  Location: [facility];  Service: Gastroenterology;  Laterality: N/A;  needs MAC due to pain and anxiety  . ILEOSTOMY N/A 05/25/2017   Procedure: DILATION OF ILEOSTOMY;  Surgeon: Rolm Bookbinder, MD;  [facility];  Service: General;  Laterality: N/A;  . ILEOSTOMY  02/11/2016   END ileostomy.  Dr Hassell Done  . ILEOSTOMY CLOSURE N/A 06/02/2017   Procedure: RESECTION OF ILEAL STRICTURE; ILEOSTOMY REVISION ;  Surgeon: Michael Boston, MD;  [facility];  Service: General;  Laterality: N/A;  . ILEOSTOMY DILATION  05/25/2017   DILITATION OF ILEAL STRICTURE  . ileostomy stricture resection  06/02/2017   with revision  . INCISION AND DRAINAGE ABSCESS  2018  . INCISION AND DRAINAGE PERIRECTAL ABSCESS N/A 09/28/2013   Procedure: IRRIGATION AND DEBRIDEMENT PERIANAL ABSCESS, proctoscopy;  Surgeon: Shann Medal, MD;  [facility];  Service: General;  Laterality: N/A;  . INCISION AND DRAINAGE PERIRECTAL ABSCESS N/A 01/22/2016   Procedure: IRRIGATION AND DEBRIDEMENT PERIRECTAL ABSCESS;  Surgeon: Armandina Gemma, MD;  [facility];  Service: General;  Laterality: N/A;  . INNER EAR SURGERY Bilateral   . LAPAROSCOPIC DIVERTED COLOSTOMY N/A 02/11/2016   Procedure: LAPAROSCOPIC DIVERTED ILEOSTOMY;  Surgeon: Johnathan Hausen, MD;  [facility];  Service: General;  Laterality: N/A;  . LAPAROSCOPY N/A 03/23/2017   Procedure: LAPAROSCOPY DIAGNOSTIC, EXPLORATORY LAPAROTOMY,  BOWEL RESECTION;  Surgeon: Jackolyn Confer, MD;   [facility];  Service: General;  Laterality: N/A;    Family History  Problem Relation Age of Onset  . Prostate cancer Brother 59  . Kidney disease Brother   . Diabetes Brother   . Brain cancer Father   . Rheum arthritis Mother   . Anesthesia problems Sister        post-op N/V  . Emphysema Maternal Grandfather   . Asthma Sister   . Rheum arthritis Brother   . Esophageal cancer Neg Hx   . Breast cancer Neg Hx   . Rectal cancer Neg Hx   . Stomach cancer Neg Hx   . Colon cancer Neg Hx     Social History   Socioeconomic History  . Marital status: Widowed    Spouse name: Not on file  . Number of children: 1  . Years of education: Not on file  . Highest education level: Not on file  Occupational History  . Occupation: Warden/ranger    Comment: retired at age 89  Tobacco Use  . Smoking status: Never Smoker  . Smokeless tobacco: Never Used  Vaping Use  . Vaping Use: Never used  Substance and Sexual Activity  . Alcohol use: Yes    Alcohol/week: 7.0 standard drinks    Types: 7 Glasses of wine per week  . Drug use: No  . Sexual activity: Not Currently    Partners: Male  Other Topics Concern  . Not on file  Social History Narrative   No exercise due to health   Social Determinants of Health   Financial Resource Strain:   . Difficulty of Paying Living Expenses: Not on file  Food Insecurity:   . Worried About Charity fundraiser in the Last Year: Not on file  . Ran Out of Food in the Last Year: Not on file  Transportation Needs:   . Lack of Transportation (Medical): Not on file  . Lack of Transportation (Non-Medical): Not on file  Physical Activity:   . Days of Exercise per Week: Not on file  . Minutes of Exercise per Session: Not on file  Stress:   . Feeling of Stress : Not on file  Social Connections:   . Frequency of Communication with Friends and Family: Not on file  . Frequency of Social Gatherings with Friends and Family: Not on file  . Attends  Religious Services: Not on file  . Active Member of Clubs or Organizations: Not on file  . Attends Archivist Meetings: Not on file  . Marital Status: Not on file  Intimate Partner Violence:   . Fear of Current or Ex-Partner: Not on file  . Emotionally Abused: Not on file  . Physically Abused: Not on file  . Sexually Abused: Not on file     PHYSICAL EXAM:  VS: BP 125/81   Pulse 73   Ht _0  (1.676 m)   Wt 147 lb (66.7 kg)  BMI 23.73 kg/m  Physical Exam Gen: NAD, alert, cooperative with exam, well-appearing MSK:  Right leg: No ecchymosis or swelling. Some mild tenderness to palpation over the fibular shaft. Normal knee range of motion. Normal ankle range of motion. Normal strength resistance. Neurovascular intact     ASSESSMENT & PLAN:   Osteopenia of neck of left femur Last bone density from 2014 showed osteopenia. -Bone density.  Closed fracture of shaft of right fibula with routine healing Initial injury on 10/7.  Pain and function have improved. -Counseled on home exercise therapy and supportive care. -Discontinue CAM Walker. -Consider physical therapy.

## 2020-03-26 NOTE — Patient Instructions (Signed)
Good to see you Please use ice and tylenol as needed  I will call once the bone density is completed   Please send me a message in MyChart with any questions or updates.  Please see [place] back as needed.   --Dr. Raeford Razor

## 2020-03-26 NOTE — Assessment & Plan Note (Signed)
Last bone density from 2014 showed osteopenia. -Bone density.

## 2020-03-26 NOTE — Assessment & Plan Note (Signed)
Initial injury on 10/7.  Pain and function have improved. -Counseled on home exercise therapy and supportive care. -Discontinue CAM Walker. -Consider physical therapy.

## 2020-03-26 NOTE — Patient Instructions (Signed)
Preventive Care 78 Years and Older, Female Preventive care refers to lifestyle choices and visits with your health care provider that can promote health and wellness. This includes:  A yearly physical exam. This is also called an annual well check.  Regular dental and eye exams.  Immunizations.  Screening for certain conditions.  Healthy lifestyle choices, such as diet and exercise. What can I expect for my preventive care visit? Physical exam Your health care provider will check:  Height and weight. These may be used to calculate body mass index (BMI), which is a measurement that tells if you are at a healthy weight.  Heart rate and blood pressure.  Your skin for abnormal spots. Counseling Your health care provider may ask you questions about:  Alcohol, tobacco, and drug use.  Emotional well-being.  Home and relationship well-being.  Sexual activity.  Eating habits.  History of falls.  Memory and ability to understand (cognition).  Work and work Statistician.  Pregnancy and menstrual history. What immunizations do I need?  Influenza (flu) vaccine  This is recommended every year. Tetanus, diphtheria, and pertussis (Tdap) vaccine  You may need a Td booster every 10 years. Varicella (chickenpox) vaccine  You may need this vaccine if you have not already been vaccinated. Zoster (shingles) vaccine  You may need this after age 78. Pneumococcal conjugate (PCV13) vaccine  One dose is recommended after age 78. Pneumococcal polysaccharide (PPSV23) vaccine  One dose is recommended after age 78. Measles, mumps, and rubella (MMR) vaccine  You may need at least one dose of MMR if you were born in 1957 or later. You may also need a second dose. Meningococcal conjugate (MenACWY) vaccine  You may need this if you have certain conditions. Hepatitis A vaccine  You may need this if you have certain conditions or if you travel or work in places where you may be exposed  to hepatitis A. Hepatitis B vaccine  You may need this if you have certain conditions or if you travel or work in places where you may be exposed to hepatitis B. Haemophilus influenzae type b (Hib) vaccine  You may need this if you have certain conditions. You may receive vaccines as individual doses or as more than one vaccine together in one shot (combination vaccines). Talk with your health care provider about the risks and benefits of combination vaccines. What tests do I need? Blood tests  Lipid and cholesterol levels. These may be checked every 5 years, or more frequently depending on your overall health.  Hepatitis C test.  Hepatitis B test. Screening  Lung cancer screening. You may have this screening every year starting at age 78 if you have a 30-pack-year history of smoking and currently smoke or have quit within the past 15 years.  Colorectal cancer screening. All adults should have this screening starting at age 78 and continuing until age 15. Your health care provider may recommend screening at age 23 if you are at increased risk. You will have tests every 1-10 years, depending on your results and the type of screening test.  Diabetes screening. This is done by checking your blood sugar (glucose) after you have not eaten for a while (fasting). You may have this done every 1-3 years.  Mammogram. This may be done every 1-2 years. Talk with your health care provider about how often you should have regular mammograms.  BRCA-related cancer screening. This may be done if you have a family history of breast, ovarian, tubal, or peritoneal cancers.  Other tests  Sexually transmitted disease (STD) testing.  Bone density scan. This is done to screen for osteoporosis. You may have this done starting at age 78. Follow these instructions at home: Eating and drinking  Eat a diet that includes fresh fruits and vegetables, whole grains, lean protein, and low-fat dairy products. Limit  your intake of foods with high amounts of sugar, saturated fats, and salt.  Take vitamin and mineral supplements as recommended by your health care provider.  Do not drink alcohol if your health care provider tells you not to drink.  If you drink alcohol: ? Limit how much you have to 0-1 drink a day. ? Be aware of how much alcohol is in your drink. In the [place] one drink equals one 12 oz bottle of beer (355 mL), one 5 oz glass of wine (148 mL), or one 1 oz glass of hard liquor (44 mL). Lifestyle  Take daily care of your teeth and gums.  Stay active. Exercise for at least 30 minutes on 5 or more days each week.  Do not use any products that contain nicotine or tobacco, such as cigarettes, e-cigarettes, and chewing tobacco. If you need help quitting, ask your health care provider.  If you are sexually active, practice safe sex. Use a condom or other form of protection in order to prevent STIs (sexually transmitted infections).  Talk with your health care provider about taking a low-dose aspirin or statin. What's next?  Go to your health care provider once a year for a well check visit.  Ask your health care provider how often you should have your eyes and teeth checked.  Stay up to date on all vaccines. This information is not intended to replace advice given to you by your health care provider. Make sure you discuss any questions you have with your health care provider. Document Revised: 04/08/2018 Document Reviewed: 04/08/2018 Elsevier Patient Education  2020 [employer].

## 2020-03-26 NOTE — Progress Notes (Signed)
Subjective:     Sara Barnes is a 79 y.o. female and is here for a comprehensive physical exam. The patient reports problems - lesion L breast-- requesting derm referral due to hx skin cancer-- she is alos seeing sport med for her knee.  Social History   Socioeconomic History  . Marital status: Widowed    Spouse name: Not on file  . Number of children: 1  . Years of education: Not on file  . Highest education level: Not on file  Occupational History  . Occupation: Warden/ranger    Comment: retired at age 40  Tobacco Use  . Smoking status: Never Smoker  . Smokeless tobacco: Never Used  Vaping Use  . Vaping Use: Never used  Substance and Sexual Activity  . Alcohol use: Yes    Alcohol/week: 7.0 standard drinks    Types: 7 Glasses of wine per week  . Drug use: No  . Sexual activity: Not Currently    Partners: Male  Other Topics Concern  . Not on file  Social History Narrative   No exercise due to health   Social Determinants of Health   Financial Resource Strain:   . Difficulty of Paying Living Expenses: Not on file  Food Insecurity:   . Worried About Charity fundraiser in the Last Year: Not on file  . Ran Out of Food in the Last Year: Not on file  Transportation Needs:   . Lack of Transportation (Medical): Not on file  . Lack of Transportation (Non-Medical): Not on file  Physical Activity:   . Days of Exercise per Week: Not on file  . Minutes of Exercise per Session: Not on file  Stress:   . Feeling of Stress : Not on file  Social Connections:   . Frequency of Communication with Friends and Family: Not on file  . Frequency of Social Gatherings with Friends and Family: Not on file  . Attends Religious Services: Not on file  . Active Member of Clubs or Organizations: Not on file  . Attends Archivist Meetings: Not on file  . Marital Status: Not on file  Intimate Partner Violence:   . Fear of Current or Ex-Partner: Not on file  . Emotionally Abused: Not  on file  . Physically Abused: Not on file  . Sexually Abused: Not on file   Health Maintenance  Topic Date Due  . TETANUS/TDAP  08/11/2016  . MAMMOGRAM  12/13/2020  . INFLUENZA VACCINE  Completed  . DEXA SCAN  Completed  . COVID-19 Vaccine  Completed  . Hepatitis C Screening  Completed  . PNA vac Low Risk Adult  Completed    The following portions of the patient's history were reviewed and updated as appropriate:  She  has a past medical history of Allergy, Anemia, Anxiety, Arthritis, Basal cell carcinoma, Blood transfusion without reported diagnosis, Bowel perforation ([place]), Cancer of lower-inner quadrant of female breast ([place]) (11/30/2014), Cancer of lower-inner quadrant of left female breast ([place]) (06/30/7423), Complication of anesthesia, Crohn's disease of large intestine with abscess ([place]), Dental crowns present, Depression, Family history of adverse reaction to anesthesia, GERD (gastroesophageal reflux disease), Hyperlipidemia, Indigestion, Perianal abscess, Personal history of radiation therapy (2016), PONV (postoperative nausea and vomiting), Runny nose (12/07/2014), Seasonal allergies, TMJ (temporomandibular joint syndrome), Ulcerative colitis, and Vitamin B12 deficiency. She does not have any pertinent problems on file. She  has a past surgical history that includes Abdominal hysterectomy; Appendectomy; Breast enhancement surgery; Incision and drainage perirectal  abscess (N/A, 09/28/2013); Inner ear surgery (Bilateral); Colonoscopy with propofol (05/28/2011; 06/28/2014); Breast lumpectomy with radioactive seed and sentinel lymph node biopsy (Left, 12/13/2014); Excision basal cell carcinoma (Left); Flexible sigmoidoscopy (N/A, 01/08/2016); Incision and drainage perirectal abscess (N/A, 01/22/2016); Laparoscopic diverted colostomy (N/A, 02/11/2016); Incision and drainage abscess (2018); laparoscopy (N/A, 03/23/2017); Ileostomy (N/A, 05/25/2017); Ileostomy (02/11/2016); ILEOSTOMY DILATION (05/25/2017);  Ileostomy closure (N/A, 06/02/2017); 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 Her family history includes Anesthesia problems in her sister; Asthma in her sister; Brain cancer in her father; Diabetes in her brother; Emphysema in her maternal grandfather; Kidney disease in her brother; Prostate cancer (age of onset: 27) in her brother; Rheum arthritis in her brother and mother. She  reports that she has never smoked. She has never used smokeless tobacco. She reports current alcohol use of about 7.0 standard drinks of alcohol per week. She reports that she does not use drugs. She has a current medication list which includes the following prescription(s): acetaminophen, cyanocobalamin, diphenoxylate-atropine, famotidine, fluoxetine, fluticasone, loperamide, ondansetron, prednisone, and zolpidem. Current Outpatient Medications on File Prior to Visit  Medication Sig Dispense Refill  . acetaminophen (TYLENOL) 500 MG tablet You can take 2 tablets every 6 hours as needed for pain.  Use this as your primary pain control.  Use Tramadol as a last resort.  You can buy this over the counter at any drug store.    DO NOT TAKE MORE THAN 4000 MG OF TYLENOL PER DAY.  IT CAN HARM YOUR LIVER. 30 tablet 0  . cyanocobalamin (,VITAMIN B-12,) 1000 MCG/ML injection INJECT 1 ML INTRAMUSCULARLY EVERY 30 DAYS 3 mL 0  . diphenoxylate-atropine (LOMOTIL) 2.5-0.025 MG tablet Take 1-2 tablets by mouth 3-4 times daily AS NEEDED for severe diarrhea 240 tablet 0  . famotidine (PEPCID) 20 MG tablet Take 1 tablet (20 mg total) by mouth 2 (two) times daily. 180 tablet 0  . fluticasone (FLONASE) 50 MCG/ACT nasal spray Place 2 sprays into both nostrils daily. 16 g 6  . loperamide (IMODIUM) 2 MG capsule Take 1 capsule (2 mg total) by mouth as needed for diarrhea or loose stools. 90 capsule 1  . ondansetron  (ZOFRAN-ODT) 4 MG disintegrating tablet DISSOLVE ONE TABLET BY MOUTH EVERY 6 HOURS AS NEEDED FOR NAUSEA AND VOMITING 90 tablet 0  . predniSONE (DELTASONE) 5 MG tablet Take 1 tablet (5 mg total) by mouth daily. 90 tablet 0  . zolpidem (AMBIEN) 10 MG tablet Take 1 tablet (10 mg total) by mouth at bedtime as needed. 30 tablet 0   No current facility-administered medications on file prior to visit.   She is allergic to codeine, mesalamine, nsaids, oxycodone, norco [hydrocodone-acetaminophen], other, and sulfa antibiotics..  Review of Systems  Review of Systems  Constitutional: Negative for activity change, appetite change and fatigue.  HENT: Negative for hearing loss, congestion, tinnitus and ear discharge.   Eyes: Negative for visual disturbance (see optho q1y -- vision corrected to 20/20 with glasses).  Respiratory: Negative for cough, chest tightness and shortness of breath.   Cardiovascular: Negative for chest pain, palpitations and leg swelling.  Gastrointestinal: Negative for abdominal pain, diarrhea, constipation and abdominal distention.  Genitourinary: Negative for urgency, frequency, decreased urine volume and difficulty urinating.  Musculoskeletal: Negative for back pain, arthralgias and gait problem.  Skin: Negative for color change, pallor and rash.  Neurological: Negative for dizziness, light-headedness, numbness and headaches.  Hematological: Negative for adenopathy. Does not bruise/bleed  easily.  Psychiatric/Behavioral: Negative for suicidal ideas, confusion, sleep disturbance, self-injury, dysphoric mood, decreased concentration and agitation.  Pt is able to read and write and can do all ADLs No risk for falling No abuse/ violence in home    Objective:    BP 120/70 (BP Location: Right Arm, Patient Position: Sitting, Cuff Size: Normal)   Pulse 71   Temp 97.8 F (36.6 C) (Oral)   Resp 18   Ht 5' 6"  (1.676 m)   Wt 155 lb 3.2 oz (70.4 kg)   SpO2 98%   BMI 25.05 kg/m   General appearance: alert, cooperative, appears stated age and no distress Head: Normocephalic, without obvious abnormality, atraumatic Eyes: negative findings: lids and lashes normal, conjunctivae and sclerae normal and pupils equal, round, reactive to light and accomodation Ears: normal TM's and external ear canals both ears Neck: no adenopathy, no carotid bruit, no JVD, supple, symmetrical, trachea midline and thyroid not enlarged, symmetric, no tenderness/mass/nodules Back: symmetric, no curvature. ROM normal. No CVA tenderness. Lungs: clear to auscultation bilaterally Breasts: deferred Heart: regular rate and rhythm, S1, S2 normal, no murmur, click, rub or gallop Abdomen: soft, non-tender; bowel sounds normal; no masses,  no organomegaly Pelvic: not indicated; status post hysterectomy, negative ROS Extremities: extremities normal, atraumatic, no cyanosis or edema Pulses: 2+ and symmetric Skin: flesh colored lesion upper L breast --- itchy per pt  Lymph nodes: Cervical, supraclavicular, and axillary nodes normal. Neurologic: Alert and oriented X 3, normal strength and tone. Normal symmetric reflexes. Normal coordination and gait   ms-- L knee pain -- chronic-- just saw sport med Assessment:     Healthy female exam.     Plan:     ghm utd Check labs See After Visit Summary for Counseling Recommendations   1. History of skin cancer  - Ambulatory referral to Dermatology  2. History of atrial fibrillation   - Lipid panel - CBC with Differential/Platelet - Comprehensive metabolic panel  3. Hyponatremia  - Comprehensive metabolic panel  4. Urinary frequency  - POCT Urinalysis Dipstick (Automated)  5. Microscopic hematuria   - Urine Culture  6. Preventative health care See above

## 2020-03-27 LAB — CBC WITH DIFFERENTIAL/PLATELET
Basophils Absolute: 0.1 10*3/uL (ref 0.0–0.1)
Basophils Relative: 0.7 % (ref 0.0–3.0)
Eosinophils Absolute: 0.1 10*3/uL (ref 0.0–0.7)
Eosinophils Relative: 0.5 % (ref 0.0–5.0)
HCT: 47.2 % — ABNORMAL HIGH (ref 36.0–46.0)
Hemoglobin: 15.5 g/dL — ABNORMAL HIGH (ref 12.0–15.0)
Lymphocytes Relative: 22.8 % (ref 12.0–46.0)
Lymphs Abs: 2.3 10*3/uL (ref 0.7–4.0)
MCHC: 32.9 g/dL (ref 30.0–36.0)
MCV: 95.5 fl (ref 78.0–100.0)
Monocytes Absolute: 0.9 10*3/uL (ref 0.1–1.0)
Monocytes Relative: 8.8 % (ref 3.0–12.0)
Neutro Abs: 6.9 10*3/uL (ref 1.4–7.7)
Neutrophils Relative %: 67.2 % (ref 43.0–77.0)
Platelets: 248 10*3/uL (ref 150.0–400.0)
RBC: 4.94 Mil/uL (ref 3.87–5.11)
RDW: 12.9 % (ref 11.5–15.5)
WBC: 10.3 10*3/uL (ref 4.0–10.5)

## 2020-03-27 LAB — URINE CULTURE
MICRO NUMBER:: 11251915
Result:: NO GROWTH
SPECIMEN QUALITY:: ADEQUATE

## 2020-03-27 LAB — COMPREHENSIVE METABOLIC PANEL
ALT: 10 U/L (ref 0–35)
AST: 15 U/L (ref 0–37)
Albumin: 4.2 g/dL (ref 3.5–5.2)
Alkaline Phosphatase: 104 U/L (ref 39–117)
BUN: 15 mg/dL (ref 6–23)
CO2: 25 mEq/L (ref 19–32)
Calcium: 9.4 mg/dL (ref 8.4–10.5)
Chloride: 98 mEq/L (ref 96–112)
Creatinine, Ser: 0.94 mg/dL (ref 0.40–1.20)
GFR: 58.16 mL/min — ABNORMAL LOW (ref >60.00)
Glucose, Bld: 92 mg/dL (ref 70–99)
Potassium: 4.3 mEq/L (ref 3.5–5.1)
Sodium: 133 mEq/L — ABNORMAL LOW (ref 135–145)
Total Bilirubin: 0.7 mg/dL (ref 0.2–1.2)
Total Protein: 7 g/dL (ref 6.0–8.3)

## 2020-03-27 LAB — LDL CHOLESTEROL, DIRECT: Direct LDL: 100 mg/dL

## 2020-03-27 LAB — LIPID PANEL
Cholesterol: 188 mg/dL (ref 0–200)
HDL: 67.8 mg/dL (ref >39.00)
NonHDL: 120.6
Total CHOL/HDL Ratio: 3
Triglycerides: 203 mg/dL — ABNORMAL HIGH (ref 0.0–149.0)
VLDL: 40.6 mg/dL — ABNORMAL HIGH (ref 0.0–40.0)

## 2020-04-09 DIAGNOSIS — Z20822 Contact with and (suspected) exposure to covid-19: Secondary | ICD-10-CM | POA: Diagnosis not present

## 2020-04-11 ENCOUNTER — Telehealth: Payer: Self-pay | Admitting: Family Medicine

## 2020-04-11 NOTE — Telephone Encounter (Signed)
Patient is calling back checking on the status of appointment. Patient is really worry about her health.

## 2020-04-11 NOTE — Telephone Encounter (Signed)
Please advise 

## 2020-04-11 NOTE — Telephone Encounter (Signed)
Let pt know we left her info with them and they should call her tomorrow if they did not today

## 2020-04-11 NOTE — Telephone Encounter (Signed)
Called infusion clinic and left patients information

## 2020-04-11 NOTE — Telephone Encounter (Signed)
Patient states she tested positive for covid on 04/10/20 and would like to be refer to the infusion clinic. Please advise

## 2020-04-12 ENCOUNTER — Telehealth: Payer: Self-pay | Admitting: Family

## 2020-04-12 ENCOUNTER — Ambulatory Visit: Payer: Medicare HMO | Admitting: Physician Assistant

## 2020-04-12 ENCOUNTER — Other Ambulatory Visit: Payer: Self-pay | Admitting: Family

## 2020-04-12 ENCOUNTER — Ambulatory Visit (HOSPITAL_COMMUNITY)
Admission: RE | Admit: 2020-04-12 | Discharge: 2020-04-12 | Disposition: A | Discharge status: Home / Self Care | Payer: Medicare Other | Source: Ambulatory Visit | Attending: Pulmonary Disease | Admitting: Pulmonary Disease

## 2020-04-12 DIAGNOSIS — U071 COVID-19: Secondary | ICD-10-CM | POA: Diagnosis present

## 2020-04-12 DIAGNOSIS — Z23 Encounter for immunization: Secondary | ICD-10-CM | POA: Diagnosis not present

## 2020-04-12 MED ORDER — METHYLPREDNISOLONE SODIUM SUCC 125 MG IJ SOLR: 125.0000 mg | Freq: Once | INTRAMUSCULAR | Status: DC | PRN

## 2020-04-12 MED ORDER — SODIUM CHLORIDE 0.9 % IV SOLN: Freq: Once | INTRAVENOUS | Status: AC

## 2020-04-12 MED ORDER — ALBUTEROL SULFATE HFA 108 (90 BASE) MCG/ACT IN AERS: 2.0000 | INHALATION_SPRAY | Freq: Once | RESPIRATORY_TRACT | Status: DC | PRN

## 2020-04-12 MED ORDER — DIPHENHYDRAMINE HCL 50 MG/ML IJ SOLN: 50.0000 mg | Freq: Once | INTRAMUSCULAR | Status: DC | PRN

## 2020-04-12 MED ORDER — SODIUM CHLORIDE 0.9 % IV SOLN: INTRAVENOUS | Status: DC | PRN

## 2020-04-12 MED ORDER — EPINEPHRINE 0.3 MG/0.3ML IJ SOAJ: 0.3000 mg | Freq: Once | INTRAMUSCULAR | Status: DC | PRN

## 2020-04-12 MED ORDER — FAMOTIDINE IN NACL 20-0.9 MG/50ML-% IV SOLN: 20.0000 mg | Freq: Once | INTRAVENOUS | Status: DC | PRN

## 2020-04-12 NOTE — Telephone Encounter (Signed)
  I connected by phone with [name] on 04/12/2020 at 2:13 PM to discuss the potential use of a new treatment for mild to moderate COVID-19 viral infection in non-hospitalized patients.  Symptoms 04/05/20 Positive test 04/10/20 at drugstore ([facility] in [place])  This patient is a 79 y.o. female that meets the FDA criteria for Emergency Use Authorization of COVID monoclonal antibody casirivimab/imdevimab, bamlanivimab/etesevimab, or sotrovimab.  Has a (+) direct SARS-CoV-2 viral test result  Has mild or moderate COVID-19   Is NOT hospitalized due to COVID-19  Is within 10 days of symptom onset  Has at least one of the high risk factor(s) for progression to severe COVID-19 and/or hospitalization as defined in EUA.  Specific high risk criteria : Older age (>/= 78 yo) and BMI > 25   I have spoken and communicated the following to the patient or parent/caregiver regarding COVID monoclonal antibody treatment:  1. FDA has authorized the emergency use for the treatment of mild to moderate COVID-19 in adults and pediatric patients with positive results of direct SARS-CoV-2 viral testing who are 8 years of age and older weighing at least 40 kg, and who are at high risk for progressing to severe COVID-19 and/or hospitalization.  2. The significant known and potential risks and benefits of COVID monoclonal antibody, and the extent to which such potential risks and benefits are unknown.  3. Information on available alternative treatments and the risks and benefits of those alternatives, including clinical trials.  4. Patients treated with COVID monoclonal antibody should continue to self-isolate and use infection control measures (e.g., wear mask, isolate, social distance, avoid sharing personal items, clean and disinfect "high touch" surfaces, and frequent handwashing) according to CDC guidelines.   5. The patient or parent/caregiver has the option to accept or refuse COVID monoclonal antibody  treatment.  After reviewing this information with the patient, the patient has agreed to receive one of the available covid 19 monoclonal antibodies and will be provided an appropriate fact sheet prior to infusion. Loel Dubonnet, NP 04/12/2020 2:13 PM

## 2020-04-12 NOTE — Discharge Instructions (Signed)
10 Things You Can Do to Manage Your COVID-19 Symptoms at Home If you have possible or confirmed COVID-19: 1. Stay home from work and school. And stay away from other public places. If you must go out, avoid using any kind of public transportation, ridesharing, or taxis. 2. Monitor your symptoms carefully. If your symptoms get worse, call your healthcare provider immediately. 3. Get rest and stay hydrated. 4. If you have a medical appointment, call the healthcare provider ahead of time and tell them that you have or may have COVID-19. 5. For medical emergencies, call 911 and notify the dispatch personnel that you have or may have COVID-19. 6. Cover your cough and sneezes with a tissue or use the inside of your elbow. 7. Wash your hands often with soap and water for at least 20 seconds or clean your hands with an alcohol-based hand sanitizer that contains at least 60% alcohol. 8. As much as possible, stay in a specific room and away from other people in your home. Also, you should use a separate bathroom, if available. If you need to be around other people in or outside of the home, wear a mask. 9. Avoid sharing personal items with other people in your household, like dishes, towels, and bedding. 10. Clean all surfaces that are touched often, like counters, tabletops, and doorknobs. Use household cleaning sprays or wipes according to the label instructions. cdc.gov/coronavirus 10/27/2018 This information is not intended to replace advice given to you by your health care provider. Make sure you discuss any questions you have with your health care provider. Document Revised: 03/31/2019 Document Reviewed: 03/31/2019 Elsevier Patient Education  2020 Elsevier Inc. What types of side effects do monoclonal antibody drugs cause?  Common side effects  In general, the more common side effects caused by monoclonal antibody drugs include: . Allergic reactions, such as hives or itching . Flu-like signs and  symptoms, including chills, fatigue, fever, and muscle aches and pains . Nausea, vomiting . Diarrhea . Skin rashes . Low blood pressure   The CDC is recommending patients who receive monoclonal antibody treatments wait at least 90 days before being vaccinated.  Currently, there are no data on the safety and efficacy of mRNA COVID-19 vaccines in persons who received monoclonal antibodies or convalescent plasma as part of COVID-19 treatment. Based on the estimated half-life of such therapies as well as evidence suggesting that reinfection is uncommon in the 90 days after initial infection, vaccination should be deferred for at least 90 days, as a precautionary measure until additional information becomes available, to avoid interference of the antibody treatment with vaccine-induced immune responses. If you have any questions or concerns after the infusion please call the Advanced Practice Provider on call at [phone]. This number is ONLY intended for your use regarding questions or concerns about the infusion post-treatment side-effects.  Please do not provide this number to others for use. For return to work notes please contact your primary care provider.   If someone you know is interested in receiving treatment please have them call the COVID hotline at [phone].   

## 2020-04-12 NOTE — Progress Notes (Signed)
  Diagnosis: COVID-19  Physician: Dr. Joya Gaskins   Procedure: Covid Infusion Clinic Med: bamlanivimab\etesevimab infusion - Provided patient with bamlanimivab\etesevimab fact sheet for patients, parents and caregivers prior to infusion.  Complications: No immediate complications noted.  Discharge: Discharged home   Sara Barnes 04/12/2020

## 2020-04-12 NOTE — Telephone Encounter (Signed)
Pt notified yesterday to expect a call from the infusion clinic

## 2020-04-12 NOTE — Progress Notes (Signed)
Patient reviewed Fact Sheet for Patients, Parents, and Caregivers for Emergency Use Authorization (EUA) of bamlanivimab and etesevimab for the Treatment of Coronavirus. Patient also reviewed and is agreeable to the estimated cost of treatment. Patient is agreeable to proceed.   

## 2020-04-13 ENCOUNTER — Other Ambulatory Visit: Payer: Self-pay | Admitting: *Deleted

## 2020-04-13 DIAGNOSIS — D84821 Immunodeficiency due to drugs: Secondary | ICD-10-CM

## 2020-04-13 MED ORDER — ZOLPIDEM TARTRATE 10 MG PO TABS: ORAL_TABLET | ORAL | 0 refills | Status: DC

## 2020-04-13 NOTE — Telephone Encounter (Signed)
Rx sent 

## 2020-05-14 ENCOUNTER — Other Ambulatory Visit: Payer: Self-pay | Admitting: Internal Medicine

## 2020-05-15 ENCOUNTER — Other Ambulatory Visit: Payer: Self-pay | Admitting: *Deleted

## 2020-05-15 DIAGNOSIS — T380X5A Adverse effect of glucocorticoids and synthetic analogues, initial encounter: Secondary | ICD-10-CM

## 2020-05-15 DIAGNOSIS — D84821 Immunodeficiency due to drugs: Secondary | ICD-10-CM

## 2020-05-15 MED ORDER — ZOLPIDEM TARTRATE 10 MG PO TABS: ORAL_TABLET | ORAL | 1 refills | Status: DC

## 2020-05-15 MED ORDER — DIPHENOXYLATE-ATROPINE 2.5-0.025 MG PO TABS: ORAL_TABLET | ORAL | 0 refills | Status: DC

## 2020-05-21 DIAGNOSIS — Z85828 Personal history of other malignant neoplasm of skin: Secondary | ICD-10-CM | POA: Diagnosis not present

## 2020-05-21 DIAGNOSIS — C44311 Basal cell carcinoma of skin of nose: Secondary | ICD-10-CM | POA: Diagnosis not present

## 2020-05-21 DIAGNOSIS — D225 Melanocytic nevi of trunk: Secondary | ICD-10-CM | POA: Diagnosis not present

## 2020-05-21 DIAGNOSIS — D485 Neoplasm of uncertain behavior of skin: Secondary | ICD-10-CM | POA: Diagnosis not present

## 2020-05-21 DIAGNOSIS — L821 Other seborrheic keratosis: Secondary | ICD-10-CM | POA: Diagnosis not present

## 2020-05-21 DIAGNOSIS — L570 Actinic keratosis: Secondary | ICD-10-CM | POA: Diagnosis not present

## 2020-05-21 DIAGNOSIS — L720 Epidermal cyst: Secondary | ICD-10-CM | POA: Diagnosis not present

## 2020-05-21 DIAGNOSIS — L820 Inflamed seborrheic keratosis: Secondary | ICD-10-CM | POA: Diagnosis not present

## 2020-05-21 DIAGNOSIS — L905 Scar conditions and fibrosis of skin: Secondary | ICD-10-CM | POA: Diagnosis not present

## 2020-05-21 DIAGNOSIS — C44519 Basal cell carcinoma of skin of other part of trunk: Secondary | ICD-10-CM | POA: Diagnosis not present

## 2020-05-24 ENCOUNTER — Ambulatory Visit: Payer: Medicare HMO | Admitting: Physician Assistant

## 2020-05-24 ENCOUNTER — Other Ambulatory Visit (INDEPENDENT_AMBULATORY_CARE_PROVIDER_SITE_OTHER): Payer: Medicare HMO

## 2020-05-24 ENCOUNTER — Other Ambulatory Visit: Payer: Self-pay

## 2020-05-24 ENCOUNTER — Encounter: Payer: Self-pay | Admitting: Physician Assistant

## 2020-05-24 VITALS — BP 112/74 | HR 80 | Ht 66.0 in | Wt 158.0 lb

## 2020-05-24 DIAGNOSIS — K219 Gastro-esophageal reflux disease without esophagitis: Secondary | ICD-10-CM

## 2020-05-24 DIAGNOSIS — Z8639 Personal history of other endocrine, nutritional and metabolic disease: Secondary | ICD-10-CM

## 2020-05-24 DIAGNOSIS — K50119 Crohn's disease of large intestine with unspecified complications: Secondary | ICD-10-CM

## 2020-05-24 LAB — CBC WITH DIFFERENTIAL/PLATELET
Basophils Absolute: 0.1 10*3/uL (ref 0.0–0.1)
Basophils Relative: 0.8 % (ref 0.0–3.0)
Eosinophils Absolute: 0 10*3/uL (ref 0.0–0.7)
Eosinophils Relative: 0.2 % (ref 0.0–5.0)
HCT: 45.2 % (ref 36.0–46.0)
Hemoglobin: 15.2 g/dL — ABNORMAL HIGH (ref 12.0–15.0)
Lymphocytes Relative: 6.6 % — ABNORMAL LOW (ref 12.0–46.0)
Lymphs Abs: 1 10*3/uL (ref 0.7–4.0)
MCHC: 33.6 g/dL (ref 30.0–36.0)
MCV: 93.5 fl (ref 78.0–100.0)
Monocytes Absolute: 0.9 10*3/uL (ref 0.1–1.0)
Monocytes Relative: 5.9 % (ref 3.0–12.0)
Neutro Abs: 13 10*3/uL — ABNORMAL HIGH (ref 1.4–7.7)
Neutrophils Relative %: 86.5 % — ABNORMAL HIGH (ref 43.0–77.0)
Platelets: 232 10*3/uL (ref 150.0–400.0)
RBC: 4.84 Mil/uL (ref 3.87–5.11)
RDW: 13.3 % (ref 11.5–15.5)
WBC: 15.1 10*3/uL — ABNORMAL HIGH (ref 4.0–10.5)

## 2020-05-24 LAB — VITAMIN B12: Vitamin B-12: 294 pg/mL (ref 211–911)

## 2020-05-24 LAB — C-REACTIVE PROTEIN: CRP: <1 mg/dL (ref 0.5–20.0)

## 2020-05-24 LAB — SEDIMENTATION RATE: Sed Rate: 12 mm/hr (ref 0–30)

## 2020-05-24 MED ORDER — DIPHENOXYLATE-ATROPINE 2.5-0.025 MG PO TABS: ORAL_TABLET | ORAL | 6 refills | Status: DC

## 2020-05-24 MED ORDER — FAMOTIDINE 20 MG PO TABS
20.0000 mg | ORAL_TABLET | Freq: Two times a day (BID) | ORAL | 6 refills | Status: DC
Start: 2020-05-24 — End: 2021-01-16

## 2020-05-24 MED ORDER — PREDNISONE 5 MG PO TABS
ORAL_TABLET | ORAL | 1 refills | Status: DC
Start: 2020-05-24 — End: 2021-01-16

## 2020-05-24 MED ORDER — ONDANSETRON 4 MG PO TBDP
ORAL_TABLET | ORAL | 6 refills | Status: DC
Start: 2020-05-24 — End: 2021-01-16

## 2020-05-24 NOTE — Progress Notes (Signed)
Subjective:    Patient ID: Sara Barnes, female    DOB: Jan 08, 1941, 80 y.o.   MRN: [id number]  HPI Sara Barnes is a pleasant 80 year old white female, known to Dr. Hilarie Barnes who comes in today for follow-up.  She has history of complicated Crohn's disease of the colon with previous perianal involvement.  She is status post prior end ileostomy and subsequent takedown in June 2020, and is status post previous right hemicolectomy after cecal perforation 2019.  Also with chronic GERD. Patient says that she has been doing very well over the past several months since she was last seen in September.  She has no complaints of abdominal pain and says she generally has anywhere from one to 3-4 loose bowel movements per day.  She has not been having any rectal bleeding.  Appetite has been good and energy level has been good.  She says she has been trying to wean down on the dose of the antidiarrheals, seems to be using Lomotil on a as needed basis twice daily and using Imodium twice daily currently.  She says if she is staying home she frequently will take any antidiarrheals. She has continued on prednisone 5 mg p.o. daily and is asking today about stopping prednisone.  She does not have any specific reason to stop the prednisone other than that she does not like to take a lot of medications.  She has declined biologic therapy. She did have Covid in mid December 2021 She did receive monoclonal antibody infusion, did not require hospitalization.  Last colonoscopy November 2019 with finding of benign anal stenosis, active colitis from the rectum to the sigmoid and exam was aborted at the sigmoid due to fragility of colonic mucosa.  Review of Systems Pertinent positive and negative review of systems were noted in the above HPI section.  All other review of systems was otherwise negative.  Outpatient Encounter Medications as of 05/24/2020  Medication Sig  . acetaminophen (TYLENOL) 500 MG tablet You can take 2 tablets  every 6 hours as needed for pain.  Use this as your primary pain control.  Use Tramadol as a last resort.  You can buy this over the counter at any drug store.    DO NOT TAKE MORE THAN 4000 MG OF TYLENOL PER DAY.  IT CAN HARM YOUR LIVER.  Marland Kitchen cyanocobalamin (,VITAMIN B-12,) 1000 MCG/ML injection INJECT 1 ML INTRAMUSCULARLY EVERY 30 DAYS  . FLUoxetine (PROZAC) 20 MG capsule Take 1 capsule (20 mg total) by mouth daily.  . fluticasone (FLONASE) 50 MCG/ACT nasal spray Place 2 sprays into both nostrils daily.  Marland Kitchen loperamide (IMODIUM) 2 MG capsule Take 1 capsule (2 mg total) by mouth as needed for diarrhea or loose stools.  . [DISCONTINUED] diphenoxylate-atropine (LOMOTIL) 2.5-0.025 MG tablet Take 1-2 tablets by mouth 3-4 times daily AS NEEDED for severe diarrhea  . [DISCONTINUED] famotidine (PEPCID) 20 MG tablet Take 1 tablet (20 mg total) by mouth 2 (two) times daily.  . [DISCONTINUED] ondansetron (ZOFRAN-ODT) 4 MG disintegrating tablet DISSOLVE ONE TABLET BY MOUTH EVERY 6 HOURS AS NEEDED FOR NAUSEA AND VOMITING  . [DISCONTINUED] predniSONE (DELTASONE) 5 MG tablet TAKE 1 TABLET(5 MG) BY MOUTH DAILY  . diphenoxylate-atropine (LOMOTIL) 2.5-0.025 MG tablet Take 1-2 tablets by mouth 3-4 times daily AS NEEDED for severe diarrhea  . famotidine (PEPCID) 20 MG tablet Take 1 tablet (20 mg total) by mouth 2 (two) times daily.  . ondansetron (ZOFRAN-ODT) 4 MG disintegrating tablet DISSOLVE ONE TABLET BY MOUTH EVERY 6  HOURS AS NEEDED FOR NAUSEA AND VOMITING  . predniSONE (DELTASONE) 5 MG tablet TAKE 1 TABLET(5 MG) BY MOUTH DAILY  . zolpidem (AMBIEN) 10 MG tablet Take 1 tablet by mouth at bedtime AS NEEDED. MAY FILL 04/18/20. Keep office visit for further refills   No facility-administered encounter medications on file as of 05/24/2020.   Allergies  Allergen Reactions  . Codeine Nausea And Vomiting  . Mesalamine Nausea And Vomiting  . Nsaids     Crohn's disease = NO NSAIDs  . Oxycodone Itching and Rash  . Norco  [Hydrocodone-Acetaminophen] Nausea Only  . Other Itching    PERFUMED LOTIONS  Cant have MRI due to ear implant   . Sulfa Antibiotics Rash   Patient Active Problem List   Diagnosis Date Noted  . Osteopenia of neck of left femur 03/26/2020  . Preventative health care 03/26/2020  . Closed fracture of shaft of right fibula with routine healing 02/02/2020  . Hyponatremia 01/23/2020  . Urinary frequency 01/23/2020  . Dysphagia 01/23/2020  . Acute pansinusitis 01/08/2018  . 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 06/02/2017 05/28/2017  . 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 05/21/2017  . Right ear pain 05/19/2017  . Dizziness 05/19/2017  . 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 03/24/2017  . Chronic diastolic CHF (congestive heart failure) ([place]) 08/05/2016  . Chest discomfort 06/21/2016  . Nausea and vomiting   . 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 Small bowel obstruction ([place])   . Ileus ([place])   . Generalized anxiety disorder 02/14/2016  . 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 Hypokalemia   . Fever   . Other depression due to general medical condition   . Difficulty in walking, not elsewhere classified   . Acute blood loss anemia   . Absolute anemia   . Left upper quadrant pain   . Abdominal pain 01/07/2016  . GI bleed 01/07/2016  . IBS (irritable bowel syndrome)   . Ten[place]mus   . Ulcerative pancolitis with rectal bleeding ([place])   . Diarrhea 12/24/2015  . Crohn disease ([place]) 12/24/2015  . Dehydration   . Ulcerative pancolitis with abscess ([place])   . Rectal pain   . Leucocytosis   . Cancer of lower-inner quadrant of left female breast ([place]) 11/30/2014  . Perirectal abscess 09/29/2013  . Depression 08/12/2006   Social History   Socioeconomic History  . Marital status: Widowed    Spouse name: Not on file  . Number of children: 1  . Years of education: Not on file  . Highest education level: Not on file  Occupational History  . Occupation:  Warden/ranger    Comment: retired at age 18  Tobacco Use  . Smoking status: Never Smoker  . Smokeless tobacco: Never Used  Vaping Use  . Vaping Use: Never used  Substance and Sexual Activity  . Alcohol use: Yes    Alcohol/week: 7.0 standard drinks    Types: 7 Glasses of wine per week  . Drug use: No  . Sexual activity: Not Currently    Partners: Male  Other Topics Concern  . Not on file  Social History Narrative   No exercise due to health   Social Determinants of Health   Financial Resource Strain: Not on file  Food Insecurity: Not on file  Transportation Needs: Not on file  Physical Activity: Not on file  Stress: Not on file  Social Connections: Not on file  Intimate Partner Violence: Not on file  Ms. Carda family history includes Anesthesia problems in her sister; Asthma in her sister; Brain cancer in her father; Diabetes in her brother; Emphysema in her maternal grandfather; Kidney disease in her brother; Prostate cancer (age of onset: 71) in her brother; Rheum arthritis in her brother and mother.      Objective:    Vitals:   05/24/20 1106  BP: 112/74  Pulse: 80  SpO2: 98%    Physical Exam Well-developed well-nourished elderly WF in no acute distress. Accompanied by sister- Height, Weight, BMI25.5  HEENT; nontraumatic normocephalic, EOMI, PE RR LA, sclera anicteric. Oropharynx;not done Neck; supple, no JVD Cardiovascular; regular rate and rhythm with S1-S2, no murmur rub or gallop Pulmonary; Clear bilaterally Abdomen; soft, nontender, nondistended, no palpable mass or hepatosplenomegaly, bowel sounds are active, midline incisional scars, there is a small hernia at the previous right lower quadrant ostomy site Rectal; not done Skin; benign exam, no jaundice rash or appreciable lesions Extremities; no clubbing cyanosis or edema skin warm and dry Neuro/Psych; alert and oriented x4, grossly nonfocal mood and affect appropriate       Assessment & Plan:    #37 80 year old white female with complicated Crohn's colitis with perianal involvement.  Patient does have component of chronic diarrhea, and has been maintained on very low-dose prednisone chronically for active disease as she has declined any other therapy i.e. Biologics. Currently doing well, minimally symptomatic  #2 status post previous right hemicolectomy after cecal perforation in 2019, prior end ileostomy and then subsequent takedown June 2020  #3 GERD stable on twice daily Pepcid #4 B12 deficiency, history #5 depression  Plan; advised patient to continue prednisone 5 mg p.o. daily, refill sent Refill Zofran 4 mg every 6 hours for as needed use, refill Lomotil 1-2 3 times daily as needed.  She is using minimal Lomotil currently Continue Imodium twice daily to 3 times daily as needed Continue Pepcid 20 mg p.o. twice daily, refill sent We will check CBC with differential, sed rate, CRP and B12 level Office follow-up with Dr. Hilarie Barnes in 3 to 4 months.  Patient encouraged to call for any problems in the interim, and was advised that I would discuss her question about discontinuing prednisone with Dr. Hilarie Barnes.  Sara Daw S Marten Iles PA-C 05/24/2020   Cc: Ann Held, *

## 2020-05-24 NOTE — Progress Notes (Signed)
Addendum: Reviewed and agree with assessment and management plan. Jhamal Plucinski M, MD  

## 2020-05-24 NOTE — Patient Instructions (Addendum)
If you are age 79 or older, your body mass index should be between 23-30. Your Body mass index is 25.5 kg/m. If this is out of the aforementioned range listed, please consider follow up with your Primary Care Provider.  If you are age 89 or younger, your body mass index should be between 19-25. Your Body mass index is 25.5 kg/m. If this is out of the aformentioned range listed, please consider follow up with your Primary Care Provider.   Your provider has requested that you go to the basement level for lab work before leaving today. Press "B" on the elevator. The lab is located at the first door on the left as you exit the elevator.  We have sent refills of Prednisone, Lomotil, Famotidine, and Ondansetron to your pharmacy.  Call the office in the next month or 2 to schedule a 4 month follow up with Dr. Hilarie Fredrickson.  Thank you for entrusting me with your care and choosing [facility].  [name], PA-C

## 2020-05-28 ENCOUNTER — Telehealth: Payer: Self-pay

## 2020-05-28 ENCOUNTER — Other Ambulatory Visit: Payer: Self-pay

## 2020-05-28 DIAGNOSIS — D72829 Elevated white blood cell count, unspecified: Secondary | ICD-10-CM

## 2020-05-28 NOTE — Telephone Encounter (Signed)
Left message to please call back

## 2020-05-28 NOTE — Telephone Encounter (Signed)
-----   Message from Alfredia Ferguson, PA-C sent at 05/28/2020  2:49 PM EST ----- Please call pt and let her know her labs look   fine except WBC is elevated at 15,000-   Dr Hilarie Fredrickson would like her to have repeat CBC with diff in 3-4 weeks - please put in order and have her pick a day .  Please continue prednisone 5 mg po daily

## 2020-05-29 NOTE — Telephone Encounter (Signed)
Spoke to patient and gave lab results. She agreed to come into our lab on 06/25/20 and get a repeat CBC

## 2020-05-29 NOTE — Telephone Encounter (Signed)
Inbound call from patient returning your call.

## 2020-06-11 DIAGNOSIS — C44519 Basal cell carcinoma of skin of other part of trunk: Secondary | ICD-10-CM | POA: Diagnosis not present

## 2020-06-11 DIAGNOSIS — D485 Neoplasm of uncertain behavior of skin: Secondary | ICD-10-CM | POA: Diagnosis not present

## 2020-06-11 DIAGNOSIS — C44622 Squamous cell carcinoma of skin of right upper limb, including shoulder: Secondary | ICD-10-CM | POA: Diagnosis not present

## 2020-06-18 ENCOUNTER — Telehealth (INDEPENDENT_AMBULATORY_CARE_PROVIDER_SITE_OTHER): Payer: Medicare HMO | Admitting: Internal Medicine

## 2020-06-18 VITALS — Ht 66.0 in

## 2020-06-18 DIAGNOSIS — J019 Acute sinusitis, unspecified: Secondary | ICD-10-CM | POA: Diagnosis not present

## 2020-06-18 MED ORDER — AZITHROMYCIN 250 MG PO TABS: ORAL_TABLET | ORAL | 0 refills | Status: DC

## 2020-06-18 MED ORDER — AZELASTINE HCL 0.1 % NA SOLN: 2.0000 | Freq: Two times a day (BID) | NASAL | 6 refills | Status: DC

## 2020-06-18 NOTE — Progress Notes (Signed)
Subjective:    Patient ID: Sara Barnes, female    DOB: 13-Jul-1940, 80 y.o.   MRN: [id number]  DOS:  06/18/2020 Type of visit - description: Virtual Visit via Telephone    I connected with above mentioned patient  by telephone and verified that I am speaking with the correct person using two identifiers.  THIS ENCOUNTER IS A VIRTUAL VISIT DUE TO COVID-19 - PATIENT WAS NOT SEEN IN THE OFFICE. PATIENT HAS CONSENTED TO VIRTUAL VISIT / TELEMEDICINE VISIT   Location of patient: home  Location of provider: office  Persons participating in the virtual visit: patient, provider   I discussed the limitations, risks, security and privacy concerns of performing an evaluation and management service by telephone and the availability of in person appointments. I also discussed with the patient that there may be a patient responsible charge related to this service. The patient expressed understanding and agreed to proceed.  Acute The patient was diagnosed with COVID mid December 2021, s/p MAB infusion She gradually recuperated completely.  She calls today because for the last 2 weeks she is having sinus problems: Teeth hurting, sinus pressure worse when she leans forward, + yellow nasal discharge from the nose. + Sneezing. Using saline rinse on the nose without much help.  Denies chest pain or difficulty breathing.  No cough No nausea or vomiting No fever chills    Review of Systems See above   Past Medical History:  Diagnosis Date  . Allergy   . Anemia   . Anxiety   . Arthritis   . Basal cell carcinoma   . Blood transfusion without reported diagnosis   . Bowel perforation ([place])   . Cancer of lower-inner quadrant of female breast ([place]) 11/30/2014   left  . Cancer of lower-inner quadrant of left female breast ([place]) 11/30/2014  . Complication of anesthesia    anxiety post-op after ear surgeries  . Crohn's disease of large intestine with abscess ([place])   . Dental crowns present     recent root canal 11/2014  . Depression   . Family history of adverse reaction to anesthesia    pt's sister has hx. of post-op N/V  . GERD (gastroesophageal reflux disease)   . Hyperlipidemia   . Indigestion   . Perianal abscess   . Personal history of radiation therapy 2016  . PONV (postoperative nausea and vomiting)   . Runny nose 12/07/2014   clear drainage, per pt.  . Seasonal allergies   . TMJ (temporomandibular joint syndrome)   . Ulcerative colitis   . Vitamin B12 deficiency     Past Surgical History:  Procedure Laterality Date  . ABDOMINAL HYSTERECTOMY     partial  . APPENDECTOMY    . AUGMENTATION MAMMAPLASTY Bilateral   . BASAL CELL CARCINOMA EXCISION Left    nose  . BREAST BIOPSY Left 2019   benign  . BREAST ENHANCEMENT SURGERY    . BREAST LUMPECTOMY Left 2016  . BREAST LUMPECTOMY WITH RADIOACTIVE SEED AND SENTINEL LYMPH NODE BIOPSY Left 12/13/2014   Procedure: LEFT BREAST LUMPECTOMY WITH RADIOACTIVE SEED AND SENTINEL LYMPH NODE BIOPSY;  Surgeon: [name], MD;  Location: [facility];  Service: General;  Laterality: Left;  . COLONOSCOPY WITH PROPOFOL  05/28/2011; 06/28/2014  . EXPLORATORY LAPAROTOMY  04/08/2018   with end ileostomy takedown and creation of loop ileostomy-- [facility]-Dr Renelda Mom  . FLEXIBLE SIGMOIDOSCOPY N/A 01/08/2016   Procedure: FLEXIBLE SIGMOIDOSCOPY;  Surgeon: Renelda Loma  Havery Moros, MD;  Location: [facility];  Service: Gastroenterology;  Laterality: N/A;  needs MAC due to pain and anxiety  . ILEOSTOMY N/A 05/25/2017   Procedure: DILATION OF ILEOSTOMY;  Surgeon: Rolm Bookbinder, MD;  Location: [facility];  Service: General;  Laterality: N/A;  . ILEOSTOMY  02/11/2016   END ileostomy.  Dr Hassell Done  . ILEOSTOMY CLOSURE N/A 06/02/2017   Procedure: RESECTION OF ILEAL STRICTURE; ILEOSTOMY REVISION ;  Surgeon: Michael Boston, MD;  Location: [facility];  Service: General;  Laterality: N/A;  . ILEOSTOMY DILATION   05/25/2017   DILITATION OF ILEAL STRICTURE  . ileostomy stricture resection  06/02/2017   with revision  . INCISION AND DRAINAGE ABSCESS  2018  . INCISION AND DRAINAGE PERIRECTAL ABSCESS N/A 09/28/2013   Procedure: IRRIGATION AND DEBRIDEMENT PERIANAL ABSCESS, proctoscopy;  Surgeon: Shann Medal, MD;  Location: [facility];  Service: General;  Laterality: N/A;  . INCISION AND DRAINAGE PERIRECTAL ABSCESS N/A 01/22/2016   Procedure: IRRIGATION AND DEBRIDEMENT PERIRECTAL ABSCESS;  Surgeon: Armandina Gemma, MD;  Location: [facility];  Service: General;  Laterality: N/A;  . INNER EAR SURGERY Bilateral   . LAPAROSCOPIC DIVERTED COLOSTOMY N/A 02/11/2016   Procedure: LAPAROSCOPIC DIVERTED ILEOSTOMY;  Surgeon: Johnathan Hausen, MD;  Location: [facility];  Service: General;  Laterality: N/A;  . LAPAROSCOPY N/A 03/23/2017   Procedure: LAPAROSCOPY DIAGNOSTIC, EXPLORATORY LAPAROTOMY,  BOWEL RESECTION;  Surgeon: Jackolyn Confer, MD;  Location: [facility];  Service: General;  Laterality: N/A;    Allergies as of 06/18/2020      Reactions   Codeine Nausea And Vomiting   Mesalamine Nausea And Vomiting   Nsaids    Crohn's disease = NO NSAIDs   Oxycodone Itching, Rash   Norco [hydrocodone-acetaminophen] Nausea Only   Other Itching   PERFUMED LOTIONS Cant have MRI due to ear implant    Sulfa Antibiotics Rash      Medication List       Accurate as of June 18, 2020 11:59 PM. If you have any questions, ask your nurse or doctor.        acetaminophen 500 MG tablet Commonly known as: TYLENOL You can take 2 tablets every 6 hours as needed for pain.  Use this as your primary pain control.  Use Tramadol as a last resort.  You can buy this over the counter at any drug store.    DO NOT TAKE MORE THAN 4000 MG OF TYLENOL PER DAY.  IT CAN HARM YOUR LIVER.   azelastine 0.1 % nasal spray Commonly known as: ASTELIN Place 2 sprays into both nostrils 2 (two) times daily.   azithromycin 250 MG tablet Commonly known as: Zithromax  Z-Pak 2 tabs a day the first day, then 1 tab a day x 4 days   cyanocobalamin 1000 MCG/ML injection Commonly known as: (VITAMIN B-12) INJECT 1 ML INTRAMUSCULARLY EVERY 30 DAYS   diphenoxylate-atropine 2.5-0.025 MG tablet Commonly known as: LOMOTIL Take 1-2 tablets by mouth 3-4 times daily AS NEEDED for severe diarrhea   famotidine 20 MG tablet Commonly known as: PEPCID Take 1 tablet (20 mg total) by mouth 2 (two) times daily.   FLUoxetine 20 MG capsule Commonly known as: PROZAC Take 1 capsule (20 mg total) by mouth daily.   fluticasone 50 MCG/ACT nasal spray Commonly known as: FLONASE Place 2 sprays into both nostrils daily.   loperamide 2 MG capsule Commonly known as: IMODIUM Take 1 capsule (2 mg total) by mouth as needed for diarrhea or loose stools.  ondansetron 4 MG disintegrating tablet Commonly known as: ZOFRAN-ODT DISSOLVE ONE TABLET BY MOUTH EVERY 6 HOURS AS NEEDED FOR NAUSEA AND VOMITING   predniSONE 5 MG tablet Commonly known as: DELTASONE TAKE 1 TABLET(5 MG) BY MOUTH DAILY   zolpidem 10 MG tablet Commonly known as: AMBIEN Take 1 tablet by mouth at bedtime AS NEEDED. MAY FILL 04/18/20. Keep office visit for further refills          Objective:   Physical Exam Ht _0  (1.676 m)   BMI 25.50 kg/m  This is a virtual telephone visit, she sounded well, no cough noted, no difficulty breathing.  No vital signs available    Assessment     80 year old female, PMH includes Crohn's disease, GERD, insomnia, depression, on prednisone presents with:  Sinusitis: Possibly sinusitis based on symptoms, she knows the limitations of telephone visit. I recommend to continue saline rinses, get Flonase, prescription for Astelin sent. She is adamant about getting an antibiotic. Explained that antibiotics can cause problems such as severe diarrhea.  At the end, we agreed that she will try a conservative treatment first and if no better will start antibiotics in a couple of  days.      I discussed the assessment and treatment plan with the patient. The patient was provided an opportunity to ask questions and all were answered. The patient agreed with the plan and demonstrated an understanding of the instructions.   The patient was advised to call back or seek an in-person evaluation if the symptoms worsen or if the condition fails to improve as anticipated.  I provided 20 minutes of non-face-to-face time during this encounter.  Kathlene November, MD

## 2020-06-21 ENCOUNTER — Other Ambulatory Visit: Payer: Self-pay

## 2020-06-21 ENCOUNTER — Ambulatory Visit: Payer: Medicare HMO | Admitting: Plastic Surgery

## 2020-06-21 ENCOUNTER — Encounter: Payer: Self-pay | Admitting: Plastic Surgery

## 2020-06-21 VITALS — BP 146/87 | HR 68 | Temp 97.9°F | Ht 66.0 in | Wt 153.0 lb

## 2020-06-21 DIAGNOSIS — L989 Disorder of the skin and subcutaneous tissue, unspecified: Secondary | ICD-10-CM

## 2020-06-21 NOTE — Progress Notes (Signed)
Referring Provider Carollee Herter, Yvonne R, DO [address] [place],  [place] [address]   CC:  Chief Complaint  Patient presents with  . Advice Only      Sara Barnes is an 80 y.o. female.  HPI: Patient presents with a suspicious lesion on her left chest.  She has had breast cancer and reconstruction in the past.  She has had quite a few nonmelanoma skin cancers removed from various places in the past.  Her left chest lesion has been present for a few months and itches.  She thinks it's growing.  Her dermatologist is concerned about it.  Allergies  Allergen Reactions  . Codeine Nausea And Vomiting  . Mesalamine Nausea And Vomiting  . Nsaids     Crohn's disease = NO NSAIDs  . Oxycodone Itching and Rash  . Norco [Hydrocodone-Acetaminophen] Nausea Only  . Other Itching    PERFUMED LOTIONS  Cant have MRI due to ear implant   . Sulfa Antibiotics Rash    Outpatient Encounter Medications as of 06/21/2020  Medication Sig  . acetaminophen (TYLENOL) 500 MG tablet You can take 2 tablets every 6 hours as needed for pain.  Use this as your primary pain control.  Use Tramadol as a last resort.  You can buy this over the counter at any drug store.    DO NOT TAKE MORE THAN 4000 MG OF TYLENOL PER DAY.  IT CAN HARM YOUR LIVER.  Marland Kitchen azelastine (ASTELIN) 0.1 % nasal spray Place 2 sprays into both nostrils 2 (two) times daily.  Marland Kitchen azithromycin (ZITHROMAX Z-PAK) 250 MG tablet 2 tabs a day the first day, then 1 tab a day x 4 days  . cyanocobalamin (,VITAMIN B-12,) 1000 MCG/ML injection INJECT 1 ML INTRAMUSCULARLY EVERY 30 DAYS  . diphenoxylate-atropine (LOMOTIL) 2.5-0.025 MG tablet Take 1-2 tablets by mouth 3-4 times daily AS NEEDED for severe diarrhea  . famotidine (PEPCID) 20 MG tablet Take 1 tablet (20 mg total) by mouth 2 (two) times daily.  Marland Kitchen FLUoxetine (PROZAC) 20 MG capsule Take 1 capsule (20 mg total) by mouth daily.  . fluticasone (FLONASE) 50 MCG/ACT nasal spray Place 2 sprays  into both nostrils daily.  Marland Kitchen loperamide (IMODIUM) 2 MG capsule Take 1 capsule (2 mg total) by mouth as needed for diarrhea or loose stools.  . ondansetron (ZOFRAN-ODT) 4 MG disintegrating tablet DISSOLVE ONE TABLET BY MOUTH EVERY 6 HOURS AS NEEDED FOR NAUSEA AND VOMITING  . predniSONE (DELTASONE) 5 MG tablet TAKE 1 TABLET(5 MG) BY MOUTH DAILY  . zolpidem (AMBIEN) 10 MG tablet Take 1 tablet by mouth at bedtime AS NEEDED. MAY FILL 04/18/20. Keep office visit for further refills   No facility-administered encounter medications on file as of 06/21/2020.     Past Medical History:  Diagnosis Date  . Allergy   . Anemia   . Anxiety   . Arthritis   . Basal cell carcinoma   . Blood transfusion without reported diagnosis   . Bowel perforation ([facility])   . Cancer of lower-inner quadrant of female breast ([facility]) 11/30/2014   left  . Cancer of lower-inner quadrant of left female breast ([place] [facility]) 11/30/2014  . Complication of anesthesia    anxiety post-op after ear surgeries  . Crohn's disease of large intestine with abscess ([facility])   . Dental crowns present    recent root canal 11/2014  . Depression   . Family history of adverse reaction to anesthesia    pt's  sister has hx. of post-op N/V  . GERD (gastroesophageal reflux disease)   . Hyperlipidemia   . Indigestion   . Perianal abscess   . Personal history of radiation therapy 2016  . PONV (postoperative nausea and vomiting)   . Runny nose 12/07/2014   clear drainage, per pt.  . Seasonal allergies   . TMJ (temporomandibular joint syndrome)   . Ulcerative colitis   . Vitamin B12 deficiency     Past Surgical History:  Procedure Laterality Date  . ABDOMINAL HYSTERECTOMY     partial  . APPENDECTOMY    . AUGMENTATION MAMMAPLASTY Bilateral   . BASAL CELL CARCINOMA EXCISION Left    nose  . BREAST BIOPSY Left 2019   benign  . BREAST ENHANCEMENT SURGERY    . BREAST LUMPECTOMY Left 2016  . BREAST LUMPECTOMY WITH RADIOACTIVE SEED AND SENTINEL LYMPH NODE  BIOPSY Left 12/13/2014   Procedure: LEFT BREAST LUMPECTOMY WITH RADIOACTIVE SEED AND SENTINEL LYMPH NODE BIOPSY;  Surgeon: [name], MD;  Location: [facility];  Service: General;  Laterality: Left;  . COLONOSCOPY WITH PROPOFOL  05/28/2011; 06/28/2014  . EXPLORATORY LAPAROTOMY  04/08/2018   with end ileostomy takedown and creation of loop ileostomy-- [facility]-Dr Renelda Mom  . FLEXIBLE SIGMOIDOSCOPY N/A 01/08/2016   Procedure: FLEXIBLE SIGMOIDOSCOPY;  Surgeon: [name], MD;  Location: [facility];  Service: Gastroenterology;  Laterality: N/A;  needs MAC due to pain and anxiety  . ILEOSTOMY N/A 05/25/2017   Procedure: DILATION OF ILEOSTOMY;  Surgeon: Rolm Bookbinder, MD;  Location: [facility];  Service: General;  Laterality: N/A;  . ILEOSTOMY  02/11/2016   END ileostomy.  Dr Hassell Done  . ILEOSTOMY CLOSURE N/A 06/02/2017   Procedure: RESECTION OF ILEAL STRICTURE; ILEOSTOMY REVISION ;  Surgeon: Michael Boston, MD;  Location: [facility];  Service: General;  Laterality: N/A;  . ILEOSTOMY DILATION  05/25/2017   DILITATION OF ILEAL STRICTURE  . ileostomy stricture resection  06/02/2017   with revision  . INCISION AND DRAINAGE ABSCESS  2018  . INCISION AND DRAINAGE PERIRECTAL ABSCESS N/A 09/28/2013   Procedure: IRRIGATION AND DEBRIDEMENT PERIANAL ABSCESS, proctoscopy;  Surgeon: Shann Medal, MD;  Location: [facility];  Service: General;  Laterality: N/A;  . INCISION AND DRAINAGE PERIRECTAL ABSCESS N/A 01/22/2016   Procedure: IRRIGATION AND DEBRIDEMENT PERIRECTAL ABSCESS;  Surgeon: Armandina Gemma, MD;  Location: [facility];  Service: General;  Laterality: N/A;  . INNER EAR SURGERY Bilateral   . LAPAROSCOPIC DIVERTED COLOSTOMY N/A 02/11/2016   Procedure: LAPAROSCOPIC DIVERTED ILEOSTOMY;  Surgeon: Johnathan Hausen, MD;  Location: [facility];  Service: General;  Laterality: N/A;  . LAPAROSCOPY N/A 03/23/2017   Procedure: LAPAROSCOPY DIAGNOSTIC, EXPLORATORY LAPAROTOMY,  BOWEL  RESECTION;  Surgeon: Jackolyn Confer, MD;  Location: [facility];  Service: General;  Laterality: N/A;    Family History  Problem Relation Age of Onset  . Prostate cancer Brother 85  . Kidney disease Brother   . Diabetes Brother   . Brain cancer Father   . Rheum arthritis Mother   . Anesthesia problems Sister        post-op N/V  . Emphysema Maternal Grandfather   . Asthma Sister   . Rheum arthritis Brother   . Esophageal cancer Neg Hx   . Breast cancer Neg Hx   . Rectal cancer Neg Hx   . Stomach cancer Neg Hx   . Colon cancer Neg Hx     Social History   Social History Narrative  No exercise due to health     Review of Systems General: Denies fevers, chills, weight loss CV: Denies chest pain, shortness of breath, palpitations  Physical Exam Vitals with BMI 06/21/2020 06/18/2020 05/24/2020  Height _0  _1  _2   Weight 153 lbs (No Data) 158 lbs  BMI 13.08 - 65.78  Systolic 469 (No Data) 629  Diastolic 87 (No Data) 74  Pulse 68 (No Data) 80    General:  No acute distress,  Alert and oriented, Non-Toxic, Normal speech and affect Left chest shows 1.5cm papule w erythema.  No other surrounding lesions.  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   Assessment/Plan Discussed excision of symptomatic left chest skin lesion.  Discussed risks and benefits including bleeding infection, damage to surrouding sturctures need for further surgery.  Will schedule for local excision.  Cindra Presume 06/21/2020, 12:07 PM

## 2020-06-25 ENCOUNTER — Other Ambulatory Visit (INDEPENDENT_AMBULATORY_CARE_PROVIDER_SITE_OTHER): Payer: Medicare HMO

## 2020-06-25 DIAGNOSIS — D72829 Elevated white blood cell count, unspecified: Secondary | ICD-10-CM | POA: Diagnosis not present

## 2020-06-25 LAB — CBC WITH DIFFERENTIAL/PLATELET
Basophils Absolute: 0.1 10*3/uL (ref 0.0–0.1)
Basophils Relative: 1.1 % (ref 0.0–3.0)
Eosinophils Absolute: 0.1 10*3/uL (ref 0.0–0.7)
Eosinophils Relative: 0.8 % (ref 0.0–5.0)
HCT: 43.6 % (ref 36.0–46.0)
Hemoglobin: 14.8 g/dL (ref 12.0–15.0)
Lymphocytes Relative: 25.9 % (ref 12.0–46.0)
Lymphs Abs: 2.1 10*3/uL (ref 0.7–4.0)
MCHC: 34 g/dL (ref 30.0–36.0)
MCV: 93 fl (ref 78.0–100.0)
Monocytes Absolute: 0.7 10*3/uL (ref 0.1–1.0)
Monocytes Relative: 9.1 % (ref 3.0–12.0)
Neutro Abs: 5.2 10*3/uL (ref 1.4–7.7)
Neutrophils Relative %: 63.1 % (ref 43.0–77.0)
Platelets: 271 10*3/uL (ref 150.0–400.0)
RBC: 4.69 Mil/uL (ref 3.87–5.11)
RDW: 12.9 % (ref 11.5–15.5)
WBC: 8.2 10*3/uL (ref 4.0–10.5)

## 2020-06-29 DIAGNOSIS — L821 Other seborrheic keratosis: Secondary | ICD-10-CM | POA: Diagnosis not present

## 2020-06-29 DIAGNOSIS — C44622 Squamous cell carcinoma of skin of right upper limb, including shoulder: Secondary | ICD-10-CM | POA: Diagnosis not present

## 2020-06-29 DIAGNOSIS — C44319 Basal cell carcinoma of skin of other parts of face: Secondary | ICD-10-CM | POA: Diagnosis not present

## 2020-07-03 ENCOUNTER — Other Ambulatory Visit: Payer: Self-pay | Admitting: Family Medicine

## 2020-07-03 DIAGNOSIS — E2839 Other primary ovarian failure: Secondary | ICD-10-CM

## 2020-07-12 ENCOUNTER — Other Ambulatory Visit: Payer: Self-pay | Admitting: Internal Medicine

## 2020-07-12 DIAGNOSIS — T380X5A Adverse effect of glucocorticoids and synthetic analogues, initial encounter: Secondary | ICD-10-CM

## 2020-07-12 DIAGNOSIS — D84821 Immunodeficiency due to drugs: Secondary | ICD-10-CM

## 2020-07-13 DIAGNOSIS — C44319 Basal cell carcinoma of skin of other parts of face: Secondary | ICD-10-CM | POA: Diagnosis not present

## 2020-07-16 ENCOUNTER — Telehealth: Payer: Self-pay | Admitting: Physician Assistant

## 2020-07-16 ENCOUNTER — Other Ambulatory Visit: Payer: Self-pay | Admitting: Internal Medicine

## 2020-07-16 DIAGNOSIS — D84821 Immunodeficiency due to drugs: Secondary | ICD-10-CM

## 2020-07-16 NOTE — Telephone Encounter (Signed)
Pt called stating that she just spoke with her pharmacy who told her that they have not yet received rf for Ambien. I told pt that it was sent on 3/17 and resent this morning. Pt is asking if we could call her pharmacy to confirm that they received it and then call her to let her know. Pt was very upset because she said that she "always has issues refilling her medication."

## 2020-07-16 NOTE — Telephone Encounter (Signed)
Spoke with pharmacy the are currently filling patient's prescription. Tried to contact patient. Phone just rang with no answer and unable to leave a voicemail.

## 2020-07-17 ENCOUNTER — Other Ambulatory Visit: Payer: Self-pay | Admitting: Family Medicine

## 2020-07-17 DIAGNOSIS — F5101 Primary insomnia: Secondary | ICD-10-CM

## 2020-07-17 DIAGNOSIS — Z7952 Long term (current) use of systemic steroids: Secondary | ICD-10-CM

## 2020-07-17 DIAGNOSIS — T380X5A Adverse effect of glucocorticoids and synthetic analogues, initial encounter: Secondary | ICD-10-CM

## 2020-07-17 MED ORDER — ZOLPIDEM TARTRATE 5 MG PO TABS: 5.0000 mg | ORAL_TABLET | Freq: Every evening | ORAL | 0 refills | Status: DC | PRN

## 2020-07-17 NOTE — Telephone Encounter (Signed)
This needs to go to her PCP for [place]

## 2020-07-17 NOTE — Telephone Encounter (Signed)
Gi had been writing this but now sent it to [place] to fill I do not like ambien and 65 and older should not have >5 mg ---- I will write a script for 30 days but she really needs to try not to take it every day because her body gets used to it and then she needs it

## 2020-07-17 NOTE — Telephone Encounter (Signed)
Routed to PCP 

## 2020-07-18 NOTE — Telephone Encounter (Signed)
Patient notified.  She will contact GI office for further refills.  She has to have that medication.  She was very upset with that office.  Dr. Hilarie Fredrickson started her on this a while ago.

## 2020-07-23 ENCOUNTER — Other Ambulatory Visit: Payer: Self-pay | Admitting: Internal Medicine

## 2020-07-25 ENCOUNTER — Ambulatory Visit: Payer: Medicare HMO | Admitting: Plastic Surgery

## 2020-07-25 ENCOUNTER — Encounter: Payer: Self-pay | Admitting: Plastic Surgery

## 2020-07-25 ENCOUNTER — Other Ambulatory Visit (HOSPITAL_COMMUNITY)
Admission: RE | Admit: 2020-07-25 | Discharge: 2020-07-25 | Disposition: A | Discharge status: Home / Self Care | Payer: Medicare HMO | Source: Ambulatory Visit | Attending: Plastic Surgery | Admitting: Plastic Surgery

## 2020-07-25 ENCOUNTER — Telehealth: Payer: Self-pay | Admitting: Physician Assistant

## 2020-07-25 ENCOUNTER — Other Ambulatory Visit: Payer: Self-pay

## 2020-07-25 VITALS — BP 146/81 | HR 82

## 2020-07-25 DIAGNOSIS — L989 Disorder of the skin and subcutaneous tissue, unspecified: Secondary | ICD-10-CM

## 2020-07-25 DIAGNOSIS — C50912 Malignant neoplasm of unspecified site of left female breast: Secondary | ICD-10-CM

## 2020-07-25 DIAGNOSIS — C44509 Unspecified malignant neoplasm of skin of other part of trunk: Secondary | ICD-10-CM | POA: Diagnosis not present

## 2020-07-25 NOTE — Telephone Encounter (Signed)
Patient called regarding the Lomotil medication will be at home till 12:30pm or after 3 pm.

## 2020-07-25 NOTE — Telephone Encounter (Signed)
Spoke with patient about her Lomotil, she stated that her pharmacy stated that we have denied her Lomotil. I advised that she needed to call and speak with her pharmacy, because we had advised her pharmacy that they should have refills on file. I advised her that we last sent in 6 refills in on 05/24/2020. She stated that that everything that her pharmacy sends to [place] gets denied and she is having a hard time. I advised she needs to talk to her pharmacy since they should have refills on file.

## 2020-07-25 NOTE — Telephone Encounter (Signed)
Left voicemail for patient to call the office

## 2020-07-25 NOTE — Progress Notes (Signed)
Operative Note   DATE OF OPERATION: 07/25/2020  LOCATION:    SURGICAL DEPARTMENT: Plastic Surgery  PREOPERATIVE DIAGNOSES: Left chest skin lesion  POSTOPERATIVE DIAGNOSES:  same  PROCEDURE:  1. Excision of left chest skin lesion measuring 3.5 cm 2. Complex closure measuring 3.5 cm  SURGEON: Talmadge Coventry, MD  ANESTHESIA:  Local  COMPLICATIONS: None.   INDICATIONS FOR PROCEDURE:  The patient, Sara Barnes is a 80 y.o. female born on 1941/03/13, is here for treatment of left chest skin lesion MRN: [id number]  CONSENT:  Informed consent was obtained directly from the patient. Risks, benefits and alternatives were fully discussed. Specific risks including but not limited to bleeding, infection, hematoma, seroma, scarring, pain, infection, wound healing problems, and need for further surgery were all discussed. The patient did have an ample opportunity to have questions answered to satisfaction.   DESCRIPTION OF PROCEDURE:  Local anesthesia was administered. The patient's operative site was prepped and draped in a sterile fashion. A time out was performed and all information was confirmed to be correct.  The lesion was excised with a 15 blade.  Hemostasis was obtained.  Circumferential undermining was performed and the skin was advanced and closed in layers with interrupted buried Monocryl sutures and 5-0 fast gut for the skin.  The lesion excised measured 3.5 cm, and the total length of closure measured 3.5 cm.    The patient tolerated the procedure well.  There were no complications.

## 2020-07-26 NOTE — Telephone Encounter (Signed)
Refills have been called to patient's pharmacy and they are getting her refill ready.

## 2020-07-26 NOTE — Telephone Encounter (Signed)
[facility] called requesting if we could refax prescription for Lomotil that was sent back in January as they stated that they did not receive it.

## 2020-07-30 ENCOUNTER — Telehealth: Payer: Self-pay | Admitting: Nurse Practitioner

## 2020-07-30 LAB — SURGICAL PATHOLOGY

## 2020-07-30 NOTE — Telephone Encounter (Signed)
Received a new pt referral from Dr. Claudia Desanctis for recurrent breast cancer. Sara Barnes has been cld and scheduled to see Lacie on 4/7 at 11am. Pt aware to arrive 20 minutes early.

## 2020-07-31 ENCOUNTER — Telehealth: Payer: Self-pay | Admitting: Plastic Surgery

## 2020-07-31 NOTE — Telephone Encounter (Signed)
Patient's sister, Shary Decamp, called to see if she could be contacted by Dr. Claudia Desanctis or a nurse regarding the pathology results. Dr. Claudia Desanctis called patient yesterday to give results. Vaughan Basta would like to have it explained to her because her sister is hard of hearing and may not have heard everything fully so she wants to make sure nothing is missed. She is fine with Doroteo Bradford to call as well. [phone]. [employer] release releases her to speak with

## 2020-07-31 NOTE — Progress Notes (Deleted)
[place]  [phone] Fax:([phone]) [place] Note   Patient Care Team: Carollee Herter, Alferd Apa, DO as PCP - Eulah Citizen, MD as Consulting Physician (Breast Surgery) Pyrtle, Lajuan Lines, MD as Consulting Physician (Gastroenterology) Druscilla Brownie, MD as Referring Physician (Dermatology) 07/31/2020  CHIEF COMPLAINTS/PURPOSE OF CONSULTATION:  Recurrent breast cancer, referred by Dr. Mingo Amber plastic surgery    Oncology History Overview Note  Cancer of lower-inner quadrant of left female breast [facility])   Staging form: Breast, AJCC 7th Edition     Clinical stage from 12/06/2014: Stage IA (T1c, N0, M0) - Unsigned     Pathologic stage from 12/13/2014: Stage IA (T1c, N0, cM0) - Unsigned      Cancer of lower-inner quadrant of left female breast ([place])  11/28/2014 Breast US   Highly suspicious 1.6 cm irregular shadowing mass at the site of palpable concern in the subareolar lower inner quadrant of the left breast at approximately 630.   11/29/2014 Receptors her2   Estrogen Receptor: 100%, POSITIVE, STRONG STAINING INTENSITY (PERFORMED MANUALLY) Progesterone Receptor: 95%, POSITIVE, STRONG STAINING INTENSITY (PERFORMED MANUALLY) Proliferation Marker Ki67: 10% (PERFORMED MANUALLY), Her2neu negative   11/29/2014 Initial Biopsy   Breast, left, needle core biopsy, subareolar about 6:30 - INVASIVE DUCTAL CARCINOMA, SEE COMMENT.   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   02/05/2015 - 02/26/2015 Radiation Therapy   left breast radiaiton     03/18/2015 -  Anti-estrogen oral therapy   She tried letrozole for 2-3 weeks, could not tolerate and stopped.   07/25/2020 Pathology Results   FINAL MICROSCOPIC DIAGNOSIS: A. SKIN, LEFT CHEST, EXCISION: - Invasive carcinoma involving dermis and subcutaneous soft tissue, consistent with patient's clinical history of invasive ductal carcinoma, see comment - Deep and lateral resection margins are negative for carcinoma  Appears grade I  ADDENDUM: PROGNOSTIC INDICATOR RESULTS: Immunohistochemical and morphometric analysis performed manually The tumor cells are NEGATIVE for Her2 (1+). Estrogen Receptor: POSITIVE, 90%, STRONG STAINING Progesterone Receptor: POSITIVE, 95%, STRONG STAINING Proliferation Marker Ki-67: 5%      HISTORY OF PRESENTING ILLNESS:  Sara Barnes 80 y.o. female with PMH of Crohn's disease s/p hemicolectomy from cecal perforation and end ileostomy with takedown in 09/2018, GERD, B12 deficiency, and osteopenia is here because of recurrent breast cancer initially diagnosed when she palpated a mass in August 2016, mammo-/ultrasound 11/28/2014 showed 1.6 cm irregular mass in the subareolar lower inner quadrant at 6:30 o'clock left breast.  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  She underwent lumpectomy by Dr. Barry Dienes, Oncotype DX result of 4 which is low risk for recurrence, predicts 4% distant recurrence risk in 10 years with tamoxifen, there was no chemotherapy benefit.  She completed adjuvant radiation to reduce risk of local recurrence.  Given the ER/PR positive breast cancer she tried adjuvant antiestrogen but stopped due to side effects and was lost to oncology follow-up in 03/2015.  Her subsequent mammograms were ordered by Dr. Barry Dienes.  She developed a left chest skin lesion and was  seen by plastic surgeon Dr. Claudia Desanctis, excision on 07/25/2020 path confirmed invasive carcinoma involving the dermis and  subcutaneous soft tissue consistent with invasive ductal carcinoma, deep and lateral margins were negative.  Prognostic panel showed ER 90% positive, PR 95% positive, HER-2 negative, and Ki-67 of 5%, grade 1, consistent with her prior breast cancer.  MEDICAL HISTORY:  Past Medical History:  Diagnosis Date  . Allergy   . Anemia   . Anxiety   . Arthritis   . Basal cell carcinoma   . Blood transfusion without reported diagnosis   . Bowel perforation ([place])   . Cancer of lower-inner quadrant of female breast ([place]) 11/30/2014   left  . Cancer of lower-inner quadrant of left female breast ([place]) 11/30/2014  . Complication of anesthesia    anxiety post-op after ear surgeries  . Crohn's disease of large intestine with abscess ([place])   . Dental crowns present    recent root canal 11/2014  . Depression   . Family history of adverse reaction to anesthesia    pt's sister has hx. of post-op N/V  . GERD (gastroesophageal reflux disease)   . Hyperlipidemia   . Indigestion   . Perianal abscess   . Personal history of radiation therapy 2016  . PONV (postoperative nausea and vomiting)   . Runny nose 12/07/2014   clear drainage, per pt.  . Seasonal allergies   . TMJ (temporomandibular joint syndrome)   . Ulcerative colitis   . Vitamin B12 deficiency     SURGICAL HISTORY: Past Surgical History:  Procedure Laterality Date  . ABDOMINAL HYSTERECTOMY     partial  . APPENDECTOMY    . AUGMENTATION MAMMAPLASTY Bilateral   . BASAL CELL CARCINOMA EXCISION Left    nose  . BREAST BIOPSY Left 2019   benign  . BREAST ENHANCEMENT SURGERY    . BREAST LUMPECTOMY Left 2016  . BREAST LUMPECTOMY WITH RADIOACTIVE SEED AND SENTINEL LYMPH NODE BIOPSY Left 12/13/2014   Procedure: LEFT BREAST LUMPECTOMY WITH RADIOACTIVE SEED AND SENTINEL LYMPH NODE BIOPSY;  Surgeon: [name], MD;  Location: [facility];  Service: General;  Laterality: Left;  . COLONOSCOPY WITH PROPOFOL  05/28/2011; 06/28/2014  .  EXPLORATORY LAPAROTOMY  04/08/2018   with end ileostomy takedown and creation of loop ileostomy-- [facility]-Dr Renelda Mom  . FLEXIBLE SIGMOIDOSCOPY N/A 01/08/2016   Procedure: FLEXIBLE SIGMOIDOSCOPY;  Surgeon: [name], MD;  Location: [facility];  Service: Gastroenterology;  Laterality: N/A;  needs MAC due to pain and anxiety  . ILEOSTOMY N/A 05/25/2017   Procedure: DILATION OF ILEOSTOMY;  Surgeon: Rolm Bookbinder, MD;  Location: [facility];  Service: General;  Laterality: N/A;  . ILEOSTOMY  02/11/2016   END ileostomy.  Dr Hassell Done  . ILEOSTOMY CLOSURE N/A 06/02/2017   Procedure: RESECTION OF ILEAL STRICTURE; ILEOSTOMY REVISION ;  Surgeon: Michael Boston, MD;  Location: [facility];  Service: General;  Laterality: N/A;  . ILEOSTOMY DILATION  05/25/2017   DILITATION OF ILEAL STRICTURE  . ileostomy stricture resection  06/02/2017   with revision  . INCISION AND DRAINAGE ABSCESS  2018  . INCISION AND DRAINAGE PERIRECTAL ABSCESS N/A 09/28/2013   Procedure: IRRIGATION AND DEBRIDEMENT PERIANAL ABSCESS, proctoscopy;  Surgeon: Shann Medal, MD;  Location: [facility];  Service: General;  Laterality: N/A;  . INCISION AND DRAINAGE PERIRECTAL ABSCESS N/A 01/22/2016   Procedure: IRRIGATION AND DEBRIDEMENT PERIRECTAL ABSCESS;  Surgeon: Armandina Gemma, MD;  Location: [facility];  Service: General;  Laterality:  N/A;  . INNER EAR SURGERY Bilateral   . LAPAROSCOPIC DIVERTED COLOSTOMY N/A 02/11/2016   Procedure: LAPAROSCOPIC DIVERTED ILEOSTOMY;  Surgeon: Johnathan Hausen, MD;  Location: [facility];  Service: General;  Laterality: N/A;  . LAPAROSCOPY N/A 03/23/2017   Procedure: LAPAROSCOPY DIAGNOSTIC, EXPLORATORY LAPAROTOMY,  BOWEL RESECTION;  Surgeon: Jackolyn Confer, MD;  Location: [facility];  Service: General;  Laterality: N/A;    SOCIAL HISTORY: Social History   Socioeconomic History  . Marital status: Widowed    Spouse name: Not on file  . Number of children: 1  . Years of education: Not  on file  . Highest education level: Not on file  Occupational History  . Occupation: Warden/ranger    Comment: retired at age 60  Tobacco Use  . Smoking status: Never Smoker  . Smokeless tobacco: Never Used  Vaping Use  . Vaping Use: Never used  Substance and Sexual Activity  . Alcohol use: Yes    Alcohol/week: 7.0 standard drinks    Types: 7 Glasses of wine per week  . Drug use: No  . Sexual activity: Not Currently    Partners: Male  Other Topics Concern  . Not on file  Social History Narrative   No exercise due to health   Social Determinants of Health   Financial Resource Strain: Not on file  Food Insecurity: Not on file  Transportation Needs: Not on file  Physical Activity: Not on file  Stress: Not on file  Social Connections: Not on file  Intimate Partner Violence: Not on file    FAMILY HISTORY: Family History  Problem Relation Age of Onset  . Prostate cancer Brother 6  . Kidney disease Brother   . Diabetes Brother   . Brain cancer Father   . Rheum arthritis Mother   . Anesthesia problems Sister        post-op N/V  . Emphysema Maternal Grandfather   . Asthma Sister   . Rheum arthritis Brother   . Esophageal cancer Neg Hx   . Breast cancer Neg Hx   . Rectal cancer Neg Hx   . Stomach cancer Neg Hx   . Colon cancer Neg Hx     ALLERGIES:  is allergic to codeine, mesalamine, nsaids, oxycodone, norco [hydrocodone-acetaminophen], other, and sulfa antibiotics.  MEDICATIONS:  Current Outpatient Medications  Medication Sig Dispense Refill  . acetaminophen (TYLENOL) 500 MG tablet You can take 2 tablets every 6 hours as needed for pain.  Use this as your primary pain control.  Use Tramadol as a last resort.  You can buy this over the counter at any drug store.    DO NOT TAKE MORE THAN 4000 MG OF TYLENOL PER DAY.  IT CAN HARM YOUR LIVER. 30 tablet 0  . azelastine (ASTELIN) 0.1 % nasal spray Place 2 sprays into both nostrils 2 (two) times daily. 30 mL 6  .  azithromycin (ZITHROMAX Z-PAK) 250 MG tablet 2 tabs a day the first day, then 1 tab a day x 4 days 6 tablet 0  . cyanocobalamin (,VITAMIN B-12,) 1000 MCG/ML injection INJECT 1 ML INTRAMUSCULARLY EVERY 30 DAYS 3 mL 0  . diphenoxylate-atropine (LOMOTIL) 2.5-0.025 MG tablet Take 1-2 tablets by mouth 3-4 times daily AS NEEDED for severe diarrhea 240 tablet 6  . famotidine (PEPCID) 20 MG tablet Take 1 tablet (20 mg total) by mouth 2 (two) times daily. 180 tablet 6  . FLUoxetine (PROZAC) 20 MG capsule Take 1 capsule (20 mg total) by mouth daily. [place]  capsule 3  . fluticasone (FLONASE) 50 MCG/ACT nasal spray Place 2 sprays into both nostrils daily. 16 g 6  . loperamide (IMODIUM) 2 MG capsule Take 1 capsule (2 mg total) by mouth as needed for diarrhea or loose stools. 90 capsule 1  . ondansetron (ZOFRAN-ODT) 4 MG disintegrating tablet DISSOLVE ONE TABLET BY MOUTH EVERY 6 HOURS AS NEEDED FOR NAUSEA AND VOMITING 90 tablet 6  . predniSONE (DELTASONE) 5 MG tablet TAKE 1 TABLET(5 MG) BY MOUTH DAILY 90 tablet 1  . zolpidem (AMBIEN) 5 MG tablet Take 1 tablet (5 mg total) by mouth at bedtime as needed for sleep. 30 tablet 0   No current facility-administered medications for this visit.    REVIEW OF SYSTEMS:   Constitutional: Denies fevers, chills or abnormal night sweats Eyes: Denies blurriness of vision, double vision or watery eyes Ears, nose, mouth, throat, and face: Denies mucositis or sore throat Respiratory: Denies cough, dyspnea or wheezes Cardiovascular: Denies palpitation, chest discomfort or lower extremity swelling Gastrointestinal:  Denies nausea, heartburn or change in bowel habits Skin: Denies abnormal skin rashes Lymphatics: Denies new lymphadenopathy or easy bruising Neurological:Denies numbness, tingling or new weaknesses Behavioral/Psych: Mood is stable, no new changes  All other systems were reviewed with the patient and are negative.  PHYSICAL EXAMINATION: ECOG PERFORMANCE STATUS: {CHL  ONC ECOG PS:[id number]}  There were no vitals filed for this visit. There were no vitals filed for this visit.  GENERAL:alert, no distress and comfortable SKIN: skin color, texture, turgor are normal, no rashes or significant lesions EYES: normal, conjunctiva are pink and non-injected, sclera clear OROPHARYNX:no exudate, no erythema and lips, buccal mucosa, and tongue normal  NECK: supple, thyroid normal size, non-tender, without nodularity LYMPH:  no palpable lymphadenopathy in the cervical, axillary or inguinal LUNGS: clear to auscultation and percussion with normal breathing effort HEART: regular rate & rhythm and no murmurs and no lower extremity edema ABDOMEN:abdomen soft, non-tender and normal bowel sounds Musculoskeletal:no cyanosis of digits and no clubbing  PSYCH: alert & oriented x 3 with fluent speech NEURO: no focal motor/sensory deficits  LABORATORY DATA:  I have reviewed the data as listed CBC Latest Ref Rng & Units 06/25/2020 05/24/2020 03/26/2020  WBC 4.0 - 10.5 K/uL 8.2 15.1(H) 10.3  Hemoglobin 12.0 - 15.0 g/dL 14.8 15.2(H) 15.5(H)  Hematocrit 36.0 - 46.0 % 43.6 45.2 47.2(H)  Platelets 150.0 - 400.0 K/uL 271.0 232.0 248.0    _0 @  RADIOGRAPHIC STUDIES: I have personally reviewed the radiological images as listed and agreed with the findings in the report. No results found.  ASSESSMENT & PLAN:  *** No orders of the defined types were placed in this encounter.   All questions were answered. The patient knows to call the clinic with any problems, questions or concerns. I spent {CHL ONC TIME VISIT - VQMGQ:[id number]} counseling the patient face to face. The total time spent in the appointment was {CHL ONC TIME VISIT - IZTIW:[id number]} and more than 50% was on counseling.     Alla Feeling, NP 07/31/2020 12:51 PM

## 2020-07-31 NOTE — Telephone Encounter (Signed)
Forward message to [place] to call patient.

## 2020-08-01 ENCOUNTER — Telehealth: Payer: Self-pay

## 2020-08-01 ENCOUNTER — Telehealth: Payer: Self-pay | Admitting: Nurse Practitioner

## 2020-08-01 NOTE — Telephone Encounter (Signed)
Received a call from Ms. Guess's caregiver to reschedule her appt w/Lacie to 4/13 at 11am.

## 2020-08-01 NOTE — Telephone Encounter (Signed)
Per Dr. Claudia Desanctis- I returned call to pt's sister- Shary Decamp @ [phone] Pt requested that Dr. Claudia Desanctis or myself call her sister to discuss the pathology findings and future care. I explained to Vaughan Basta that the pathology report indicated that all the margins for the specimen were clear/negative. Given her history of left breast carcinoma 5 yrs ago for which Dr. Barry Dienes performed a lumpectomy- Dr. Claudia Desanctis did refer her to Oncology- She has an appointment with Oncologist- Dr Kalman Shan tomorrow. I explained to her that pathology is performing multiple tests on the specimen to determine type of cancer & the Oncologist will receive the results to determine further plan of care. Pt has a f/u visit with Dr. Claudia Desanctis on 08/08/20. I reminded them to call back for any other questions or concerns.

## 2020-08-02 ENCOUNTER — Inpatient Hospital Stay: Payer: Medicare HMO | Admitting: Nurse Practitioner

## 2020-08-08 ENCOUNTER — Encounter: Payer: Self-pay | Admitting: Nurse Practitioner

## 2020-08-08 ENCOUNTER — Other Ambulatory Visit: Payer: Self-pay

## 2020-08-08 ENCOUNTER — Ambulatory Visit: Payer: Medicare HMO | Admitting: Plastic Surgery

## 2020-08-08 ENCOUNTER — Inpatient Hospital Stay: Payer: Medicare HMO | Attending: Nurse Practitioner | Admitting: Nurse Practitioner

## 2020-08-08 ENCOUNTER — Other Ambulatory Visit: Payer: Self-pay | Admitting: *Deleted

## 2020-08-08 VITALS — BP 138/98 | HR 86 | Temp 98.4°F | Resp 19 | Ht 65.0 in | Wt 161.5 lb

## 2020-08-08 DIAGNOSIS — K5090 Crohn's disease, unspecified, without complications: Secondary | ICD-10-CM | POA: Insufficient documentation

## 2020-08-08 DIAGNOSIS — Z79810 Long term (current) use of selective estrogen receptor modulators (SERMs): Secondary | ICD-10-CM | POA: Diagnosis not present

## 2020-08-08 DIAGNOSIS — Z923 Personal history of irradiation: Secondary | ICD-10-CM | POA: Diagnosis not present

## 2020-08-08 DIAGNOSIS — Z933 Colostomy status: Secondary | ICD-10-CM | POA: Diagnosis not present

## 2020-08-08 DIAGNOSIS — Z79899 Other long term (current) drug therapy: Secondary | ICD-10-CM | POA: Insufficient documentation

## 2020-08-08 DIAGNOSIS — C792 Secondary malignant neoplasm of skin: Secondary | ICD-10-CM

## 2020-08-08 DIAGNOSIS — F418 Other specified anxiety disorders: Secondary | ICD-10-CM | POA: Diagnosis not present

## 2020-08-08 DIAGNOSIS — Z170 Estrogen receptor positive status [ER+]: Secondary | ICD-10-CM | POA: Diagnosis not present

## 2020-08-08 DIAGNOSIS — Z85828 Personal history of other malignant neoplasm of skin: Secondary | ICD-10-CM | POA: Diagnosis not present

## 2020-08-08 DIAGNOSIS — M8580 Other specified disorders of bone density and structure, unspecified site: Secondary | ICD-10-CM | POA: Diagnosis not present

## 2020-08-08 DIAGNOSIS — C50312 Malignant neoplasm of lower-inner quadrant of left female breast: Secondary | ICD-10-CM | POA: Insufficient documentation

## 2020-08-08 NOTE — Progress Notes (Addendum)
[place]  Telephone:([phone]) [phone] Fax:([phone]) [place] Note   Patient Care Team: Carollee Herter, Alferd Apa, DO as PCP - Eulah Citizen, MD as Consulting Physician (Breast Surgery) Pyrtle, Lajuan Lines, MD as Consulting Physician (Gastroenterology) Druscilla Brownie, MD as Referring Physician (Dermatology) Cindra Presume, MD as Consulting Physician (Plastic Surgery) Truitt Merle, MD as Consulting Physician (Hematology) 08/08/2020  CHIEF COMPLAINTS/PURPOSE OF CONSULTATION:  Recurrent breast cancer, referred by Dr. Silverio Lay pace of plastic surgery  SUMMARY OF ONCOLOGY HISTORY  Oncology History Overview Note  Cancer of lower-inner quadrant of left female breast [facility])   Staging form: Breast, AJCC 7th Edition     Clinical stage from 12/06/2014: Stage IA (T1c, N0, M0) - Unsigned     Pathologic stage from 12/13/2014: Stage IA (T1c, N0, cM0) - Unsigned      Cancer of lower-inner quadrant of left female breast ([place])  11/28/2014 Breast US   Highly suspicious 1.6 cm irregular shadowing mass at the site of palpable concern in the subareolar lower inner quadrant of the left breast at approximately 630.   11/29/2014 Receptors her2   Estrogen Receptor: 100%, POSITIVE, STRONG STAINING INTENSITY (PERFORMED MANUALLY) Progesterone Receptor: 95%, POSITIVE, STRONG STAINING INTENSITY (PERFORMED MANUALLY) Proliferation Marker Ki67: 10% (PERFORMED MANUALLY), Her2neu negative   11/29/2014 Initial Biopsy   Breast, left, needle core biopsy, subareolar about 6:30 - INVASIVE DUCTAL CARCINOMA, SEE COMMENT.   11/30/2014 Initial Diagnosis   Cancer of lower-inner quadrant of left female breast   12/13/2014 Surgery   Left breast lumpectomy, with sentinel lymph nodes biopsy   12/13/2014 Pathology Results   Left breast lumpectomy showed a 1.7 cm invasive ductal carcinoma, grade 1, margins were negative, (+) LVI, 5 Sentinel lymph nodes were negative,   12/13/2014 Oncotype testing   RS 4,  which predicts 10-year risk of distant recurrence 4% with tamoxifen alone.   02/05/2015 - 02/26/2015 Radiation Therapy   left breast radiaiton    03/18/2015 -  Anti-estrogen oral therapy   She tried letrozole for 2-3 weeks, could not tolerate and stopped.   07/25/2020 Pathology Results   FINAL MICROSCOPIC DIAGNOSIS: A. SKIN, LEFT CHEST, EXCISION: - Invasive carcinoma involving dermis and subcutaneous soft tissue, consistent with patient's clinical history of invasive ductal carcinoma, see comment - Deep and lateral resection margins are negative for carcinoma  Appears grade I  ADDENDUM: PROGNOSTIC INDICATOR RESULTS: Immunohistochemical and morphometric analysis performed manually The tumor cells are NEGATIVE for Her2 (1+). Estrogen Receptor: POSITIVE, 90%, STRONG STAINING Progesterone Receptor: POSITIVE, 95%, STRONG STAINING Proliferation Marker Ki-67: 5%      HISTORY OF PRESENTING ILLNESS:  Sara Barnes 80 y.o. female with past medical history of Crohn's disease, IBS, colostomy with takedown, anxiety and depression, osteopenia, and nonmelanoma skin cancers is here because of recurrent breast cancer.  She initially palpated a mass in the left breast, diagnostic mammogram and ultrasound on 11/28/2014 showed an irregular 1.3 x 0.7 x 0.6 cm mass in the lower inner quadrant left breast.  Biopsy showed invasive ductal carcinoma with lymphovascular invasion, grade 1, ER 100% positive with strong staining intensity, PR 95% positive, HER-2 negative with a Ki67 proliferation marker of 10%.  She underwent left breast lumpectomy on 12/13/2014 by Dr. Barry Dienes, surgical path showed invasive grade 1 ductal carcinoma 1.7cm with intermediate grade DCIS.  There was lymphovascular invasion identified.  There was invasive carcinoma in the close inferior and posterior margins, otherwise margins were negative.  5 sentinel lymph nodes were sampled, all  negative.  Prognostic profile again showed ER/PR positive  HER-2 negative.  This was staged PT1CPN0.  Oncotype DX showed a recurrence score of 4, which predicts 4% of distant recurrence risk in 10 years with tamoxifen.  She did not need adjuvant chemotherapy.  To reduce her risk of local recurrence she completed adjuvant breast radiation and tolerated well.  She did not tolerate antiestrogen therapy due to her history of colitis and was lost to follow-up.  She developed a rash on the left chest and was referred to plastic surgeon Dr. Silverio Lay pace for excisional biopsy, path from 07/25/2020 showed invasive carcinoma involving the dermis and subcutaneous soft tissue consistent with history of invasive ductal carcinoma.  The deep and lateral margins were negative.  This appeared to be grade 1, ER 90% positive with strong staining, PR 95% positive with strong staining, HER-2 negative (1+) and a proliferation marker of 5%.   GYN HISTORY  Menarchal: age 38 LMP: age 22 with hysterectomy Contraceptive: briefly when married HRT: "once or twice" after hyst, not consistent and none post menopause G1P1  Socially, she is single and lives by herself, she is functional and independent with ADLs, has some assistance with heavy lifting and strenuous tasks.  She drives herself.  She has 1 son and 1 grandson.  A father had brain cancer, brother had nonmelanoma skin cancer and prostate cancer.  Denies breast/GYN or pancreatic cancer in her family.  Her last mammogram was 11/2019 was negative.  Menarche will age 42, LMP at age 73 with partial hysterectomy (for endometriosis).  She took hormone replacement once or twice after hysterectomy but not regular. G1P1.  Today she presents with her daughter-in-law, Coralyn Mark.  She notes the skin area in the left upper chest over her breast implant began as a "pimple" which she attributed to other benign skin changes she has noticed since completing breast radiation.  She has 1 other questionable area in her upper abdomen.  Energy and appetite are  normal, denies unintentional weight loss.  Denies new bone/joint or abdominal pain.  Her Crohn's is controlled.  She is hesitant to try AI, she had diarrhea and was hospitalized for 8 weeks with 40 pounds weight loss when she tried it in 2016.  She has gained that weight back.  MEDICAL HISTORY:  Past Medical History:  Diagnosis Date  . Allergy   . Anemia   . Anxiety   . Arthritis   . Basal cell carcinoma   . Blood transfusion without reported diagnosis   . Bowel perforation ([place])   . Cancer of lower-inner quadrant of female breast ([place]) 11/30/2014   left  . Cancer of lower-inner quadrant of left female breast ([place]) 11/30/2014  . Complication of anesthesia    anxiety post-op after ear surgeries  . Crohn's disease of large intestine with abscess ([place])   . Dental crowns present    recent root canal 11/2014  . Depression   . Family history of adverse reaction to anesthesia    pt's sister has hx. of post-op N/V  . GERD (gastroesophageal reflux disease)   . Hyperlipidemia   . Indigestion   . Perianal abscess   . Personal history of radiation therapy 2016  . PONV (postoperative nausea and vomiting)   . Runny nose 12/07/2014   clear drainage, per pt.  . Seasonal allergies   . TMJ (temporomandibular joint syndrome)   . Ulcerative colitis   . Vitamin B12 deficiency     SURGICAL HISTORY: Past Surgical History:  Procedure Laterality Date  . ABDOMINAL HYSTERECTOMY     partial  . APPENDECTOMY    . AUGMENTATION MAMMAPLASTY Bilateral   . BASAL CELL CARCINOMA EXCISION Left    nose  . BREAST BIOPSY Left 2019   benign  . BREAST ENHANCEMENT SURGERY    . BREAST LUMPECTOMY Left 2016  . BREAST LUMPECTOMY WITH RADIOACTIVE SEED AND SENTINEL LYMPH NODE BIOPSY Left 12/13/2014   Procedure: LEFT BREAST LUMPECTOMY WITH RADIOACTIVE SEED AND SENTINEL LYMPH NODE BIOPSY;  Surgeon: [name], MD;  Location: [place];  Service: General;  Laterality: Left;  . COLONOSCOPY WITH PROPOFOL   05/28/2011; 06/28/2014  . EXPLORATORY LAPAROTOMY  04/08/2018   with end ileostomy takedown and creation of loop ileostomy-- [facility]-Dr Renelda Mom  . FLEXIBLE SIGMOIDOSCOPY N/A 01/08/2016   Procedure: FLEXIBLE SIGMOIDOSCOPY;  Surgeon: [name], MD;  Location: [facility];  Service: Gastroenterology;  Laterality: N/A;  needs MAC due to pain and anxiety  . ILEOSTOMY N/A 05/25/2017   Procedure: DILATION OF ILEOSTOMY;  Surgeon: Rolm Bookbinder, MD;  Location: [facility];  Service: General;  Laterality: N/A;  . ILEOSTOMY  02/11/2016   END ileostomy.  Dr Hassell Done  . ILEOSTOMY CLOSURE N/A 06/02/2017   Procedure: RESECTION OF ILEAL STRICTURE; ILEOSTOMY REVISION ;  Surgeon: Michael Boston, MD;  Location: [facility];  Service: General;  Laterality: N/A;  . ILEOSTOMY DILATION  05/25/2017   DILITATION OF ILEAL STRICTURE  . ileostomy stricture resection  06/02/2017   with revision  . INCISION AND DRAINAGE ABSCESS  2018  . INCISION AND DRAINAGE PERIRECTAL ABSCESS N/A 09/28/2013   Procedure: IRRIGATION AND DEBRIDEMENT PERIANAL ABSCESS, proctoscopy;  Surgeon: Shann Medal, MD;  Location: [facility];  Service: General;  Laterality: N/A;  . INCISION AND DRAINAGE PERIRECTAL ABSCESS N/A 01/22/2016   Procedure: IRRIGATION AND DEBRIDEMENT PERIRECTAL ABSCESS;  Surgeon: Armandina Gemma, MD;  Location: [facility];  Service: General;  Laterality: N/A;  . INNER EAR SURGERY Bilateral   . LAPAROSCOPIC DIVERTED COLOSTOMY N/A 02/11/2016   Procedure: LAPAROSCOPIC DIVERTED ILEOSTOMY;  Surgeon: Johnathan Hausen, MD;  Location: [facility];  Service: General;  Laterality: N/A;  . LAPAROSCOPY N/A 03/23/2017   Procedure: LAPAROSCOPY DIAGNOSTIC, EXPLORATORY LAPAROTOMY,  BOWEL RESECTION;  Surgeon: Jackolyn Confer, MD;  Location: [facility];  Service: General;  Laterality: N/A;    SOCIAL HISTORY: Social History   Socioeconomic History  . Marital status: Widowed    Spouse name: Not on file  . Number of children: 1  .  Years of education: Not on file  . Highest education level: Not on file  Occupational History  . Occupation: Warden/ranger    Comment: retired at age 45  Tobacco Use  . Smoking status: Never Smoker  . Smokeless tobacco: Never Used  Vaping Use  . Vaping Use: Never used  Substance and Sexual Activity  . Alcohol use: Yes    Alcohol/week: 7.0 standard drinks    Types: 7 Glasses of wine per week  . Drug use: No  . Sexual activity: Not Currently    Partners: Male  Other Topics Concern  . Not on file  Social History Narrative   No exercise due to health   Social Determinants of Health   Financial Resource Strain: Not on file  Food Insecurity: Not on file  Transportation Needs: Not on file  Physical Activity: Not on file  Stress: Not on file  Social Connections: Not on file  Intimate Partner Violence: Not on file  FAMILY HISTORY: Family History  Problem Relation Age of Onset  . Prostate cancer Brother 29  . Kidney disease Brother   . Diabetes Brother   . Brain cancer Father   . Rheum arthritis Mother   . Anesthesia problems Sister        post-op N/V  . Emphysema Maternal Grandfather   . Asthma Sister   . Rheum arthritis Brother   . Esophageal cancer Neg Hx   . Breast cancer Neg Hx   . Rectal cancer Neg Hx   . Stomach cancer Neg Hx   . Colon cancer Neg Hx     ALLERGIES:  is allergic to codeine, mesalamine, nsaids, oxycodone, norco [hydrocodone-acetaminophen], other, and sulfa antibiotics.  MEDICATIONS:  Current Outpatient Medications  Medication Sig Dispense Refill  . acetaminophen (TYLENOL) 500 MG tablet You can take 2 tablets every 6 hours as needed for pain.  Use this as your primary pain control.  Use Tramadol as a last resort.  You can buy this over the counter at any drug store.    DO NOT TAKE MORE THAN 4000 MG OF TYLENOL PER DAY.  IT CAN HARM YOUR LIVER. 30 tablet 0  . azelastine (ASTELIN) 0.1 % nasal spray Place 2 sprays into both nostrils 2 (two) times  daily. 30 mL 6  . cyanocobalamin (,VITAMIN B-12,) 1000 MCG/ML injection INJECT 1 ML INTRAMUSCULARLY EVERY 30 DAYS 3 mL 0  . diphenoxylate-atropine (LOMOTIL) 2.5-0.025 MG tablet Take 1-2 tablets by mouth 3-4 times daily AS NEEDED for severe diarrhea 240 tablet 6  . famotidine (PEPCID) 20 MG tablet Take 1 tablet (20 mg total) by mouth 2 (two) times daily. 180 tablet 6  . FLUoxetine (PROZAC) 20 MG capsule Take 1 capsule (20 mg total) by mouth daily. 90 capsule 3  . fluticasone (FLONASE) 50 MCG/ACT nasal spray Place 2 sprays into both nostrils daily. 16 g 6  . loperamide (IMODIUM) 2 MG capsule Take 1 capsule (2 mg total) by mouth as needed for diarrhea or loose stools. 90 capsule 1  . ondansetron (ZOFRAN-ODT) 4 MG disintegrating tablet DISSOLVE ONE TABLET BY MOUTH EVERY 6 HOURS AS NEEDED FOR NAUSEA AND VOMITING 90 tablet 6  . predniSONE (DELTASONE) 5 MG tablet TAKE 1 TABLET(5 MG) BY MOUTH DAILY 90 tablet 1  . zolpidem (AMBIEN) 5 MG tablet Take 1 tablet (5 mg total) by mouth at bedtime as needed for sleep. (Patient taking differently: Take 5 mg by mouth at bedtime as needed for sleep. Take two a bedtime) 30 tablet 0   No current facility-administered medications for this visit.    REVIEW OF SYSTEMS:   Constitutional: Denies fevers, chills or abnormal night sweats Eyes: Denies blurriness of vision, double vision or watery eyes Ears, nose, mouth, throat, and face: Denies mucositis or sore throat Respiratory: Denies cough, dyspnea or wheezes Cardiovascular: Denies palpitation, chest discomfort or lower extremity swelling Gastrointestinal:  Denies nausea, heartburn or change in bowel habits (+) Crohn's disease  Skin: Denies abnormal skin rashes (+) scattered skin eruptions (+) h/o non-melanoma skin cancers  Lymphatics: Denies new lymphadenopathy or easy bruising MSK: (+) "extra disc" contributes to intermittent back pain, stable  Neurological:Denies numbness, tingling or new  weaknesses Behavioral/Psych: Mood is stable, no new changes (+) anxiety/depression  All other systems were reviewed with the patient and are negative.  PHYSICAL EXAMINATION: ECOG PERFORMANCE STATUS: 0 - Asymptomatic  Vitals:   08/08/20 1000  BP: (!) 138/98  Pulse: 86  Resp: 19  Temp: 98.4 F (36.9  C)  SpO2: 98%   Filed Weights   08/08/20 1000  Weight: 161 lb 8 oz (73.3 kg)    GENERAL:alert, no distress and comfortable SKIN: scattered skin eruptions of varying size shape and color. S/p excision left upper chest wall, incision healing well. Small round raised erythematous nodule to upper middle abdomen  EYES: sclera clear LYMPH:  no palpable lymphadenopathy  LUNGS:  normal breathing effort HEART:  no lower extremity edema Musculoskeletal:no cyanosis of digits and no clubbing  PSYCH: alert & oriented x 3 with fluent speech NEURO: no focal motor/sensory deficits Breast: s/p left lumpectomy and implant. L breast is firm, no palpable mass or nodularity.         LABORATORY DATA:  I have reviewed the data as listed CBC Latest Ref Rng & Units 06/25/2020 05/24/2020 03/26/2020  WBC 4.0 - 10.5 K/uL 8.2 15.1(H) 10.3  Hemoglobin 12.0 - 15.0 g/dL 14.8 15.2(H) 15.5(H)  Hematocrit 36.0 - 46.0 % 43.6 45.2 47.2(H)  Platelets 150.0 - 400.0 K/uL 271.0 232.0 248.0   CMP Latest Ref Rng & Units 03/26/2020 02/09/2020 01/23/2020  Glucose 70 - 99 mg/dL 92 93 101(H)  BUN 6 - 23 mg/dL _0 Creatinine 0.40 - 1.20 mg/dL 0.94 0.89 1.15(H)  Sodium 135 - 145 mEq/L 133(L) 135 134(L)  Potassium 3.5 - 5.1 mEq/L 4.3 4.3 4.3  Chloride 96 - 112 mEq/L 98 101 101  CO2 19 - 32 mEq/L _1 Calcium 8.4 - 10.5 mg/dL 9.4 9.3 9.1  Total Protein 6.0 - 8.3 g/dL 7.0 6.5 6.5  Total Bilirubin 0.2 - 1.2 mg/dL 0.7 0.7 0.5  Alkaline Phos 39 - 117 U/L 104 - -  AST 0 - 37 U/L _2 ALT 0 - 35 U/L _3 RADIOGRAPHIC STUDIES: I have personally reviewed the radiological images as listed and agreed  with the findings in  the report. No results found.  ASSESSMENT & PLAN: 80 year old postmenopausal female  1. 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  - Initially diagnosed with stage IA invasive ductal carcinoma of the left breast in 2016, s/p lumpectomy and sentinel lymph node biopsy by Dr. Barry Dienes.  Oncotype recurrence score was 4, predicting a 4% recurrence risk in 9 years with tamoxifen.  Adjuvant chemotherapy was not recommended.   -She completed adjuvant radiation to reduce risk of local recurrence.   -Adjuvant AI was recommended, she tried briefly in 02/2015 but she did not tolerate due to colitis, then she was lost to follow-up -She developed a pimple-like skin rash to the left upper chest, evaluated by dermatology and ultimately referred to plastic surgeon Dr. Claudia Desanctis for excisional biopsy in the setting of a left breast implant.  -path showed invasive carcinoma, c/w h/o breast cancer, ER/PR + HER2 - with Ki67 of 5%, this is felt to represent recurrence of her initial breast cancer from 2016.  -we are referring her for diagnostic mammo/L br [place] to r/o new breast primary such as underlying breast mass invading the skin (infammatory breast cancer) -if mammo/US are negative, we discussed this is likely recurrent ER/PR pos her2 neg metastatic breast cancer from her 2016 breast cancer, now to the skin, and is likely not curable at this stage but still treatable. Any treatment would be palliative.   -  We are referring her for cerianna PET scan to complete staging work up.  -Given the strong ER and PR positivity, we do recommend adjuvant aromatase inhibitor for disease control. The potential benefit and side effects were discussed. She had severe colitis when she tried letrozole in the past. Her  Crohn's is controlled now, but she is reluctant to try any anti-estrogen therapy.  -she has osteopenia from 2014, we would recommend new baseline DEXA to r/o osteoporosis, if so, then we may recommend tamoxifen and likely zometa.  -Follow up after diagnostic bilateral mammo, L br ultrasound, and PET scan to review staging work up and finalize the treatment plan.  2. Crohn's disease and IBS -she developed colitis after trying letrozole briefly in 02/2015, per historical notes this resolved few days after she stopped it but was reluctant to try more AI.  -Chart review shows a hospitalization for severe colitis 01/07/2016-02/29/2016 where she required a colostomy which was eventually reversed.  By patient's account this hospitalization was secondary to AI -Her Crohn's and IBS are controlled now, managed by Dr. Hilarie Fredrickson -On B12 injection, Imodium, Pepcid, Zofran as needed, and prednisone daily   3.  Anxiety and depression -Controlled on Prozac per PCP -stressed lately but otherwise controlled   4.  Chronic intermittent back pain -Secondary to disc issue -Mild and stable, not limiting function.  She does have assistance with some heavy lifting and strenuous activities  PLAN: -Medical record and recent skin path reviewed  -Proceed with diagnostic b/l mammogram and left breast US  -Cerianna PET scan for staging  -F/up after work up, to review staging and finalize treatment plan -Breast navigator to assist with scheduling, will contact the patient  -cc note to Drs. Wynelle Cleveland, and Pyrtle   Orders Placed This Encounter  Procedures  . US BREAST COMPLETE UNI LEFT INC AXILLA    Standing Status:   Future    Standing Expiration Date:   08/08/2021    Order Specific Question:   Reason for Exam (SYMPTOM  OR DIAGNOSIS REQUIRED)    Answer:   h/o left breast cancer 2016 with skin recurrence 06/2020 r/o new primary    Order Specific Question:   Preferred imaging location?    Answer:   [facility]  .  NM PET ([place]) WHOLE BODY    Standing Status:   Future    Standing Expiration Date:   08/08/2021    Order Specific Question:   If indicated for the ordered procedure, I authorize the administration of a radiopharmaceutical per Radiology protocol    Answer:   Yes    Order Specific Question:   Preferred imaging location?    Answer:   [facility]  . MM DIAG BREAST W/IMPLANT TOMO BILATERAL    Standing Status:   Future    Standing Expiration Date:   08/08/2021    Order Specific Question:   Reason for Exam (SYMPTOM  OR DIAGNOSIS REQUIRED)    Answer:   h/o left breast cancer 2016 lumpectomy new skin recurrence 06/2020 r/o new breast primary    Order Specific Question:   Preferred imaging location?    Answer:   [facility]    All questions were answered. The patient knows to call the clinic with any problems, questions or concerns.     Alla Feeling, NP 08/08/2020   Addendum  I have seen the patient, examined her. I agree with the assessment and and plan and have edited the notes.   Ms Zacarias was seen  by me in 2016 for stage I left breast cancer.  She tolerated anastrozole poorly and stopped.  She lost follow-up since then.  She now presented with biopsy-proven left chest wall skin recurrence of breast cancer, which was ER and PR positive, HER-2 negative, similar to her initial left breast cancer.  The lesion was removed by plastic surgeon Dr. Claudia Desanctis completely.  Physical exam showed no residual skin lesion around the incision site, but she does have diffuse skin erythema at the breast skin fold, and a few small papular skin lesions.  I discussed this is likely metastatic recurrence from her previous breast cancer, also a new primary is not completely ruled out.  I recommend bilateral diagnostic mammogram and left breast and axilla ultrasound, and Cerianna PET scan for staging, and ruled out other distant metastasis.  Patient agrees with the plan.  She previously tolerated anastrozole poorly  with severe diarrhea (she has Crohn's disease), she is not sure if she wants any antiestrogen therapy.  I will see her back in 2 to 3 weeks after the above work-up. I will also reach out to Dr. Claudia Desanctis about her management. All questions were answered.   Truitt Merle  08/08/2020

## 2020-08-15 ENCOUNTER — Ambulatory Visit
Admission: RE | Admit: 2020-08-15 | Discharge: 2020-08-15 | Disposition: A | Discharge status: Home / Self Care | Payer: Medicare HMO | Source: Ambulatory Visit | Attending: Nurse Practitioner | Admitting: Nurse Practitioner

## 2020-08-15 ENCOUNTER — Ambulatory Visit: Admission: RE | Admit: 2020-08-15 | Payer: Medicare HMO | Source: Ambulatory Visit

## 2020-08-15 ENCOUNTER — Other Ambulatory Visit: Payer: Self-pay

## 2020-08-15 DIAGNOSIS — R922 Inconclusive mammogram: Secondary | ICD-10-CM | POA: Diagnosis not present

## 2020-08-15 DIAGNOSIS — C50312 Malignant neoplasm of lower-inner quadrant of left female breast: Secondary | ICD-10-CM

## 2020-08-15 DIAGNOSIS — Z170 Estrogen receptor positive status [ER+]: Secondary | ICD-10-CM

## 2020-08-15 DIAGNOSIS — C792 Secondary malignant neoplasm of skin: Secondary | ICD-10-CM

## 2020-08-16 ENCOUNTER — Telehealth: Payer: Self-pay | Admitting: Hematology

## 2020-08-16 NOTE — Telephone Encounter (Signed)
Scheduled appt per 4/21 sch msg. Called pt, no answer. Left msg with appt date and time.

## 2020-08-16 NOTE — Telephone Encounter (Signed)
left voicemail for patient to return my call so that I could give instructions on upcoming appointment

## 2020-08-17 ENCOUNTER — Telehealth: Payer: Self-pay | Admitting: Hematology

## 2020-08-17 NOTE — Telephone Encounter (Signed)
Cancelled appts per 4/22 sch msg. When I talked to the pt she told me she would call back after May 16th to r/s the appts. She declined to r/s at this time.

## 2020-08-20 ENCOUNTER — Telehealth: Payer: Self-pay | Admitting: *Deleted

## 2020-08-20 NOTE — Telephone Encounter (Signed)
-----   Message from [employer]. Tanner, PA-C sent at 08/16/2020  2:53 PM EDT -----  ----- Message ----- From: Alla Feeling, NP Sent: 08/16/2020   2:45 PM EDT To: [facility]  Please call to let patient know there is no left breast mass or any abnormality seen on mammogram. I have asked our scheduler to make f/up appointment after PET scan to discuss all work up.   Thanks, Regan Rakers, NP

## 2020-08-20 NOTE — Telephone Encounter (Signed)
Per Cira Rue, NP, called pt with message below. Pt stated she will call once she comes back from 2 week vacation to set appt. Pt verbalized understanding

## 2020-08-24 ENCOUNTER — Ambulatory Visit (HOSPITAL_COMMUNITY): Payer: Medicare HMO

## 2020-08-27 ENCOUNTER — Ambulatory Visit: Payer: Medicare HMO | Admitting: Hematology

## 2020-08-27 ENCOUNTER — Other Ambulatory Visit: Payer: Medicare HMO

## 2020-09-19 ENCOUNTER — Telehealth: Payer: Self-pay | Admitting: Hematology

## 2020-09-19 NOTE — Telephone Encounter (Signed)
Scheduled appts per 5/24 sch msg. Called pt, no answer. Left msg with appts date and times.

## 2020-09-27 ENCOUNTER — Telehealth: Payer: Self-pay | Admitting: Hematology

## 2020-09-27 NOTE — Telephone Encounter (Signed)
I called pt to let her know her Pet has been cancelled as PA expired.  She did not answer and I was unable to leave a message.  I called again pt states she did not make the appt for PET scan.  She states she has not agreed to having this test.  She is going to wait until after her next mammogram to be completed to decide if she will have the PET scan and any follow up with [place].

## 2020-09-27 NOTE — Telephone Encounter (Signed)
Pt called to cancel appt. I spoke to pt who declined to r/s at this time.

## 2020-09-28 ENCOUNTER — Ambulatory Visit (HOSPITAL_COMMUNITY): Admission: RE | Admit: 2020-09-28 | Payer: Medicare HMO | Source: Ambulatory Visit

## 2020-09-28 ENCOUNTER — Other Ambulatory Visit: Payer: Self-pay | Admitting: *Deleted

## 2020-09-28 DIAGNOSIS — E538 Deficiency of other specified B group vitamins: Secondary | ICD-10-CM

## 2020-09-28 MED ORDER — CYANOCOBALAMIN 1000 MCG/ML IJ SOLN: INTRAMUSCULAR | 0 refills | Status: DC

## 2020-10-01 ENCOUNTER — Other Ambulatory Visit: Payer: Self-pay | Admitting: Internal Medicine

## 2020-10-01 ENCOUNTER — Ambulatory Visit: Payer: Medicare HMO | Admitting: Hematology

## 2020-10-01 DIAGNOSIS — E538 Deficiency of other specified B group vitamins: Secondary | ICD-10-CM

## 2020-10-09 ENCOUNTER — Telehealth: Payer: Self-pay

## 2020-10-09 DIAGNOSIS — F5101 Primary insomnia: Secondary | ICD-10-CM

## 2020-10-09 MED ORDER — ZOLPIDEM TARTRATE 5 MG PO TABS: 5.0000 mg | ORAL_TABLET | Freq: Every evening | ORAL | 0 refills | Status: DC | PRN

## 2020-10-09 NOTE — Telephone Encounter (Signed)
Chart reviewed Ambien is a long term med for her.  She and I have discussed this in the past including the risks, benefits and alternatives.  To this point she has tolerated this med without side-effects or complications.  Ok to refill ambien 5 mg qHS PRN, #30 monthly

## 2020-10-09 NOTE — Telephone Encounter (Signed)
30 day supply of Ambien 5 mg, qhs prn sent to pharmacy per Dr. Hilarie Fredrickson

## 2020-10-09 NOTE — Telephone Encounter (Signed)
Patient requesting refill of Ambien 10 mg qhs prn. Please advise. Note: see telephone notes from 07-16-20

## 2020-10-11 ENCOUNTER — Other Ambulatory Visit: Payer: Self-pay

## 2020-10-11 DIAGNOSIS — F5101 Primary insomnia: Secondary | ICD-10-CM

## 2020-10-11 MED ORDER — ZOLPIDEM TARTRATE 5 MG PO TABS: 5.0000 mg | ORAL_TABLET | Freq: Every evening | ORAL | 0 refills | Status: DC | PRN

## 2020-10-11 NOTE — Telephone Encounter (Signed)
Patient called and requested to speak with you she did not provide a reason for call.

## 2020-10-11 NOTE — Telephone Encounter (Signed)
Called and spoke to patient. She indicates that [facility] does not have the Ambien script. Prescription reprinted, signed by Dr. Havery Moros bc Dr. Hilarie Fredrickson is out of the office this afternoon, and faxed to [facility]. Called [facility].  They confirmed script was rec'd and is being processed.  They will notify patient. Called and informed patient.

## 2020-10-17 ENCOUNTER — Encounter: Payer: Self-pay | Admitting: Gastroenterology

## 2020-10-17 ENCOUNTER — Other Ambulatory Visit (INDEPENDENT_AMBULATORY_CARE_PROVIDER_SITE_OTHER): Payer: Medicare HMO

## 2020-10-17 ENCOUNTER — Ambulatory Visit: Payer: Medicare HMO | Admitting: Gastroenterology

## 2020-10-17 VITALS — BP 140/78 | HR 76 | Ht 66.0 in | Wt 159.6 lb

## 2020-10-17 DIAGNOSIS — F5101 Primary insomnia: Secondary | ICD-10-CM | POA: Insufficient documentation

## 2020-10-17 DIAGNOSIS — K50119 Crohn's disease of large intestine with unspecified complications: Secondary | ICD-10-CM | POA: Diagnosis not present

## 2020-10-17 DIAGNOSIS — E538 Deficiency of other specified B group vitamins: Secondary | ICD-10-CM

## 2020-10-17 LAB — CBC WITH DIFFERENTIAL/PLATELET
Basophils Absolute: 0.1 10*3/uL (ref 0.0–0.1)
Basophils Relative: 0.8 % (ref 0.0–3.0)
Eosinophils Absolute: 0 10*3/uL (ref 0.0–0.7)
Eosinophils Relative: 0.1 % (ref 0.0–5.0)
HCT: 43.4 % (ref 36.0–46.0)
Hemoglobin: 14.8 g/dL (ref 12.0–15.0)
Lymphocytes Relative: 8.7 % — ABNORMAL LOW (ref 12.0–46.0)
Lymphs Abs: 1.2 10*3/uL (ref 0.7–4.0)
MCHC: 34.1 g/dL (ref 30.0–36.0)
MCV: 92.5 fl (ref 78.0–100.0)
Monocytes Absolute: 0.8 10*3/uL (ref 0.1–1.0)
Monocytes Relative: 5.9 % (ref 3.0–12.0)
Neutro Abs: 11.6 10*3/uL — ABNORMAL HIGH (ref 1.4–7.7)
Neutrophils Relative %: 84.5 % — ABNORMAL HIGH (ref 43.0–77.0)
Platelets: 277 10*3/uL (ref 150.0–400.0)
RBC: 4.7 Mil/uL (ref 3.87–5.11)
RDW: 12.3 % (ref 11.5–15.5)
WBC: 13.7 10*3/uL — ABNORMAL HIGH (ref 4.0–10.5)

## 2020-10-17 LAB — COMPREHENSIVE METABOLIC PANEL
ALT: 12 U/L (ref 0–35)
AST: 17 U/L (ref 0–37)
Albumin: 4 g/dL (ref 3.5–5.2)
Alkaline Phosphatase: 99 U/L (ref 39–117)
BUN: 12 mg/dL (ref 6–23)
CO2: 26 mEq/L (ref 19–32)
Calcium: 9.4 mg/dL (ref 8.4–10.5)
Chloride: 94 mEq/L — ABNORMAL LOW (ref 96–112)
Creatinine, Ser: 0.98 mg/dL (ref 0.40–1.20)
GFR: 55.1 mL/min — ABNORMAL LOW (ref >60.00)
Glucose, Bld: 107 mg/dL — ABNORMAL HIGH (ref 70–99)
Potassium: 4.3 mEq/L (ref 3.5–5.1)
Sodium: 127 mEq/L — ABNORMAL LOW (ref 135–145)
Total Bilirubin: 0.6 mg/dL (ref 0.2–1.2)
Total Protein: 7.1 g/dL (ref 6.0–8.3)

## 2020-10-17 LAB — SEDIMENTATION RATE: Sed Rate: 13 mm/hr (ref 0–30)

## 2020-10-17 LAB — C-REACTIVE PROTEIN: CRP: 1.1 mg/dL (ref 0.5–20.0)

## 2020-10-17 MED ORDER — CYANOCOBALAMIN 1000 MCG/ML IJ SOLN: INTRAMUSCULAR | 0 refills | Status: DC

## 2020-10-17 MED ORDER — ZOLPIDEM TARTRATE 10 MG PO TABS: 10.0000 mg | ORAL_TABLET | Freq: Every day | ORAL | 0 refills | Status: DC

## 2020-10-17 NOTE — Progress Notes (Signed)
10/17/2020 Sara Barnes [id number] 19-Mar-1941   HISTORY OF PRESENT ILLNESS: This is a 80 year-old female who is a patient of Dr. Vena Rua who comes in today for follow-up of her Crohn's disease.  She has history of complicated Crohn's disease of the colon with previous perianal involvement.  She is status post prior and ileostomy and subsequent takedown in June 2020 and is status post previous right hemicolectomy after cecal perforation in 2019.  She is on chronic low-dose prednisone as she has declined biologic therapy in the past.  She apparently took Entyio for a short period of time.  She recently had some basal cell carcinomas removed from her skin and wanted [place] to document that since she had previously been on the [facility].  She is here with her sister today, very disgruntled and upset because her last Ambien prescription was sent in for only 5 mg at bedtime instead of 10 mg.  Also her last vitamin B12 prescription was only sent for a 1 month supply instead of a 31-monthsupply.    She tells me that overall she has been stable since she was seen here in January.  She did recently take an antibiotic for sinus infection so had had some diarrhea while on that, but has been improving.  She had gotten down to where she was only using the Lomotil once a day.  Denies any rectal bleeding.  Gets occasional fleeting abdominal pains.  Her last colonoscopy was in November 2019 with finding of benign anal stenosis, active colitis from the rectum to the sigmoid colon.  The exam was aborted at the sigmoid due to fragility of the colonic mucosa.   Past Medical History:  Diagnosis Date   Allergy    Anemia    Anxiety    Arthritis    Basal cell carcinoma    Blood transfusion without reported diagnosis    [place]    Cancer of lower-inner quadrant of female breast ([facility] 11/30/2014   left   Cancer of lower-inner quadrant of left female breast ([facility] 81/10/4079  Complication of anesthesia     anxiety post-op after ear surgeries   Cro[place] disease of large intestine with [place]    Dental crowns present    recent root canal 11/2014   Depression    Family history of adverse reaction to anesthesia    pt's sister has hx. of post-op N/V   GERD (gastroesophageal reflux disease)    Hyperlipidemia    Indigestion    Perianal abscess    Personal history of radiation therapy 2016   PONV (postoperative nausea and vomiting)    Runny nose 12/07/2014   clear drainage, per pt.   Seasonal allergies    TMJ (temporomandibular joint syndrome)    Ulcerative colitis    Vitamin B12 deficiency    Past Surgical History:  Procedure Laterality Date   ABDOMINAL HYSTERECTOMY     partial   APPENDECTOMY     AUGMENTATION MAMMAPLASTY Bilateral    BASAL CELL CARCINOMA EXCISION Left    nose   BREAST BIOPSY Left 2019   benign   BREAST ENHANCEMENT SURGERY     BREAST LUMPECTOMY Left 2016   BREAST LUMPECTOMY WITH RADIOACTIVE SEED AND SENTINEL LYMPH NODE BIOPSY Left 12/13/2014   Procedure: LEFT BREAST LUMPECTOMY WITH RADIOACTIVE SEED AND SENTINEL LYMPH NODE BIOPSY;  Surgeon: FStark Klein MD;  [facility]  Service: General;  Laterality: Left;   COLONOSCOPY WITH PROPOFOL  05/28/2011; 06/28/2014   EXPLORATORY LAPAROTOMY  04/08/2018   with end ileostomy takedown and creation of loop ileostomy-- [place] N/A 01/08/2016   Procedure: FLEXIBLE SIGMOIDOSCOPY;  Surgeon: [name], MD;  Location: [facility];  Service: Gastroenterology;  Laterality: N/A;  needs MAC due to pain and anxiety   ILEOSTOMY N/A 05/25/2017   Procedure: DILATION OF ILEOSTOMY;  Surgeon: Rolm Bookbinder, MD;  Location: [facility];  Service: General;  Laterality: N/A;   ILEOSTOMY  02/11/2016   END ileostomy.  Dr Hassell Done   ILEOSTOMY CLOSURE N/A 06/02/2017   Procedure: RESECTION OF ILEAL STRICTURE; ILEOSTOMY REVISION ;  Surgeon: Michael Boston,  MD;  Location: [facility];  Service: General;  Laterality: N/A;   ILEOSTOMY DILATION  05/25/2017   DILITATION OF ILEAL STRICTURE   ileostomy stricture resection  06/02/2017   with revision   INCISION AND DRAINAGE ABSCESS  2018   INCISION AND DRAINAGE PERIRECTAL ABSCESS N/A 09/28/2013   Procedure: IRRIGATION AND DEBRIDEMENT PERIANAL ABSCESS, proctoscopy;  Surgeon: Shann Medal, MD;  Location: [facility];  Service: General;  Laterality: N/A;   INCISION AND DRAINAGE PERIRECTAL ABSCESS N/A 01/22/2016   Procedure: IRRIGATION AND DEBRIDEMENT PERIRECTAL ABSCESS;  Surgeon: Armandina Gemma, MD;  Location: [facility];  Service: General;  Laterality: N/A;   INNER EAR SURGERY Bilateral    LAPAROSCOPIC DIVERTED COLOSTOMY N/A 02/11/2016   Procedure: LAPAROSCOPIC DIVERTED ILEOSTOMY;  Surgeon: Johnathan Hausen, MD;  Location: [facility];  Service: General;  Laterality: N/A;   LAPAROSCOPY N/A 03/23/2017   Procedure: LAPAROSCOPY DIAGNOSTIC, EXPLORATORY LAPAROTOMY,  BOWEL RESECTION;  Surgeon: Jackolyn Confer, MD;  Location: [facility];  Service: General;  Laterality: N/A;    reports that she has never smoked. She has never used smokeless tobacco. She reports current alcohol use of about 7.0 standard drinks of alcohol per week. She reports that she does not use drugs. family history includes Anesthesia problems in her sister; Asthma in her sister; Brain cancer in her father; Diabetes in her brother; Emphysema in her maternal grandfather; Kidney disease in her brother; Prostate cancer (age of onset: 69) in her brother; Rheum arthritis in her brother and mother. Allergies  Allergen Reactions   Codeine Nausea And Vomiting   Mesalamine Nausea And Vomiting   Nsaids     Crohn's disease = NO NSAIDs   Oxycodone Itching and Rash   Norco [Hydrocodone-Acetaminophen] Nausea Only   Other Itching    PERFUMED LOTIONS  Cant have MRI due to ear implant    Sulfa Antibiotics Rash      Outpatient Encounter Medications as of 10/17/2020  Medication  Sig   acetaminophen (TYLENOL) 500 MG tablet You can take 2 tablets every 6 hours as needed for pain.  Use this as your primary pain control.  Use Tramadol as a last resort.  You can buy this over the counter at any drug store.    DO NOT TAKE MORE THAN 4000 MG OF TYLENOL PER DAY.  IT CAN HARM YOUR LIVER.   azelastine (ASTELIN) 0.1 % nasal spray Place 2 sprays into both nostrils 2 (two) times daily.   cyanocobalamin (,VITAMIN B-12,) 1000 MCG/ML injection ADMINISTER 1 ML IN THE MUSCLE EVERY 30 DAYS   diphenoxylate-atropine (LOMOTIL) 2.5-0.025 MG tablet Take 1-2 tablets by mouth 3-4 times daily AS NEEDED for severe diarrhea   famotidine (PEPCID) 20 MG tablet Take 1 tablet (20 mg total) by mouth 2 (two) times daily.   FLUoxetine (PROZAC) 20  MG capsule Take 1 capsule (20 mg total) by mouth daily.   fluticasone (FLONASE) 50 MCG/ACT nasal spray Place 2 sprays into both nostrils daily.   loperamide (IMODIUM) 2 MG capsule Take 1 capsule (2 mg total) by mouth as needed for diarrhea or loose stools.   ondansetron (ZOFRAN-ODT) 4 MG disintegrating tablet DISSOLVE ONE TABLET BY MOUTH EVERY 6 HOURS AS NEEDED FOR NAUSEA AND VOMITING   predniSONE (DELTASONE) 5 MG tablet TAKE 1 TABLET(5 MG) BY MOUTH DAILY   zolpidem (AMBIEN) 5 MG tablet Take 1 tablet (5 mg total) by mouth at bedtime as needed for sleep.   No facility-administered encounter medications on file as of 10/17/2020.    REVIEW OF SYSTEMS  : All other systems reviewed and negative except where noted in the History of Present Illness.   PHYSICAL EXAM: BP 140/78   Pulse 76   Ht _0  (1.676 m)   Wt 159 lb 9.6 oz (72.4 kg)   BMI 25.76 kg/m  General: Well developed white female in no acute distress Head: Normocephalic and atraumatic Eyes:  Sclerae anicteric, conjunctiva pink. Ears: Normal auditory acuity Lungs: Clear throughout to auscultation; no W/R/R. Heart: Regular rate and rhythm; no M/R/G. Abdomen: Soft, non-distended.  BS present.   Non-tender.  Scars noted on abdomen from previous surgeries. Musculoskeletal: Symmetrical with no gross deformities  Skin: No lesions on visible extremities Extremities: No edema  Neurological: Alert oriented x 4, grossly non-focal Psychological:  Alert and cooperative. Normal mood and affect  ASSESSMENT AND PLAN: #69 80 year old white female with complicated Crohn's colitis with perianal involvement.  Patient does have component of chronic diarrhea, and has been maintained on very low-dose prednisone chronically for active disease as she has declined any other therapy i.e. Biologics. Currently doing well, minimally symptomatic.  Took abx recently for sinusitis so had some more diarrhea from that but it has been improving.   #2 status post previous right hemicolectomy after cecal perforation in 2019, prior end ileostomy and then subsequent takedown June 2020   #3 GERD stable on twice daily Pepcid  #4 B12 deficiency    **We will check a CBC, CMP, sed rate, CRP, vitamin B12 levels today.   **Prescription for Ambien 10 mg at bedtime sent to her pharmacy.  Given 90-day supply. **Prescription renewed for vitamin B-12 monthly injections and none for 19-monthsupply sent to the pharmacy. **Continue her prednisone 5 mg daily. **Consider beginning daily probiotic again in the form of Florastor or align especially with her recent antibiotic use. **Antidiarrheals and antiemetics as needed. **Follow-up with Dr. PHilarie Fredricksonin 3 months.  CC:  Sara Barnes *

## 2020-10-17 NOTE — Patient Instructions (Signed)
We have sent the following medications to your pharmacy for you to pick up at your convenience: Ambien and B12 injections.  Your provider has requested that you go to the basement level for lab work before leaving today. Press "B" on the elevator. The lab is located at the first door on the left as you exit the elevator.  If you are age 79 or older, your body mass index should be between 23-30. Your Body mass index is 25.76 kg/m. If this is out of the aforementioned range listed, please consider follow up with your Primary Care Provider.  If you are age 62 or younger, your body mass index should be between 19-25. Your Body mass index is 25.76 kg/m. If this is out of the aformentioned range listed, please consider follow up with your Primary Care Provider.   __________________________________________________________  The [facility] providers would like to encourage you to use [facility] to communicate with providers for non-urgent requests or questions.  Due to long hold times on the telephone, sending your provider a message by [facility] may be a faster and more efficient way to get a response.  Please allow 48 business hours for a response.  Please remember that this is for non-urgent requests.

## 2020-10-18 ENCOUNTER — Telehealth: Payer: Self-pay

## 2020-10-18 ENCOUNTER — Other Ambulatory Visit (INDEPENDENT_AMBULATORY_CARE_PROVIDER_SITE_OTHER): Payer: Medicare HMO

## 2020-10-18 ENCOUNTER — Other Ambulatory Visit: Payer: Self-pay

## 2020-10-18 DIAGNOSIS — E871 Hypo-osmolality and hyponatremia: Secondary | ICD-10-CM

## 2020-10-18 DIAGNOSIS — E538 Deficiency of other specified B group vitamins: Secondary | ICD-10-CM

## 2020-10-18 LAB — VITAMIN B12: Vitamin B-12: 779 pg/mL (ref 211–911)

## 2020-10-18 NOTE — Telephone Encounter (Signed)
Left message for patient to please call back

## 2020-10-18 NOTE — Telephone Encounter (Signed)
-----   Message from Loralie Champagne, PA-C sent at 10/18/2020  9:36 AM EDT ----- Please let her know that her labs look stable except for her sodium level is low at 127.  It has been low in the past as well, but a little lower than usual.  This may be a little bit of dehydration from her recent increase in diarrhea that she was having while on antibiotics.  Please ask her to hydrate well over the next several days with Pedialyte or Gatorade and come in for repeat BMP on Monday.  Thank you,  Jess

## 2020-10-22 ENCOUNTER — Other Ambulatory Visit: Payer: Self-pay

## 2020-10-22 ENCOUNTER — Telehealth: Payer: Self-pay

## 2020-10-22 DIAGNOSIS — E871 Hypo-osmolality and hyponatremia: Secondary | ICD-10-CM

## 2020-10-22 NOTE — Telephone Encounter (Signed)
The pt has been advised of the results- she will hydrate and repeat labs this week.  Order entered.

## 2020-10-22 NOTE — Telephone Encounter (Signed)
-----   Message from Loralie Champagne, PA-C sent at 10/18/2020  9:36 AM EDT ----- Please let her know that her labs look stable except for her sodium level is low at 127.  It has been low in the past as well, but a little lower than usual.  This may be a little bit of dehydration from her recent increase in diarrhea that she was having while on antibiotics.  Please ask her to hydrate well over the next several days with Pedialyte or Gatorade and come in for repeat BMP on Monday.  Thank you,  Jess

## 2020-10-22 NOTE — Telephone Encounter (Signed)
Inbound call from patient returning call.

## 2020-10-23 NOTE — Progress Notes (Signed)
Addendum: Reviewed and agree with assessment and management plan. Payten Beaumier M, MD  

## 2020-10-25 ENCOUNTER — Other Ambulatory Visit (INDEPENDENT_AMBULATORY_CARE_PROVIDER_SITE_OTHER): Payer: Medicare HMO

## 2020-10-25 DIAGNOSIS — E871 Hypo-osmolality and hyponatremia: Secondary | ICD-10-CM | POA: Diagnosis not present

## 2020-10-25 LAB — BASIC METABOLIC PANEL
BUN: 12 mg/dL (ref 6–23)
CO2: 25 mEq/L (ref 19–32)
Calcium: 9.2 mg/dL (ref 8.4–10.5)
Chloride: 99 mEq/L (ref 96–112)
Creatinine, Ser: 0.93 mg/dL (ref 0.40–1.20)
GFR: 58.67 mL/min — ABNORMAL LOW (ref >60.00)
Glucose, Bld: 88 mg/dL (ref 70–99)
Potassium: 3.9 mEq/L (ref 3.5–5.1)
Sodium: 130 mEq/L — ABNORMAL LOW (ref 135–145)

## 2020-10-29 ENCOUNTER — Other Ambulatory Visit: Payer: Self-pay

## 2020-10-29 ENCOUNTER — Encounter (HOSPITAL_BASED_OUTPATIENT_CLINIC_OR_DEPARTMENT_OTHER): Payer: Self-pay

## 2020-10-29 ENCOUNTER — Other Ambulatory Visit: Payer: Self-pay | Admitting: Family Medicine

## 2020-10-29 ENCOUNTER — Emergency Department (HOSPITAL_BASED_OUTPATIENT_CLINIC_OR_DEPARTMENT_OTHER)
Admission: EM | Admit: 2020-10-29 | Discharge: 2020-10-29 | Disposition: A | Discharge status: Home / Self Care | Payer: Medicare HMO | Attending: Emergency Medicine | Admitting: Emergency Medicine

## 2020-10-29 DIAGNOSIS — Z85828 Personal history of other malignant neoplasm of skin: Secondary | ICD-10-CM | POA: Diagnosis not present

## 2020-10-29 DIAGNOSIS — J309 Allergic rhinitis, unspecified: Secondary | ICD-10-CM | POA: Insufficient documentation

## 2020-10-29 DIAGNOSIS — J0101 Acute recurrent maxillary sinusitis: Secondary | ICD-10-CM | POA: Diagnosis not present

## 2020-10-29 DIAGNOSIS — R0981 Nasal congestion: Secondary | ICD-10-CM | POA: Diagnosis present

## 2020-10-29 DIAGNOSIS — E871 Hypo-osmolality and hyponatremia: Secondary | ICD-10-CM

## 2020-10-29 DIAGNOSIS — I5032 Chronic diastolic (congestive) heart failure: Secondary | ICD-10-CM | POA: Diagnosis not present

## 2020-10-29 DIAGNOSIS — Z853 Personal history of malignant neoplasm of breast: Secondary | ICD-10-CM | POA: Insufficient documentation

## 2020-10-29 MED ORDER — METHYLPREDNISOLONE SODIUM SUCC 125 MG IJ SOLR
125.0000 mg | Freq: Once | INTRAMUSCULAR | Status: AC
  Administered 2020-10-29: 125 mg via INTRAMUSCULAR
  Filled 2020-10-29: qty 2

## 2020-10-29 NOTE — ED Triage Notes (Signed)
Has had sinus infection x 6weeks and treated with antibiotic. This is a recurrent problem for and she states steroid shot helps best. Crohn's tends to exacerbate with po antibiotics.

## 2020-10-29 NOTE — ED Provider Notes (Signed)
[facility] DE[facility] Provider Note   CSN: [id number] Arrival date & time: 10/29/20  6962     History Chief Complaint  Patient presents with   sinus congestion    Sara Barnes is a 80 y.o. female.  HPI     80 year old female with a history of basal cell carcinoma, breast cancer, Crohn's disease with history of complications including abscess, cecal perforation with right hemicolectomy, maintained on very low-dose prednisone, hyperlipidemia, depression, recent sinusitis for which she had been on antibiotics, presents with concern for continued sinus symptoms.  6 weeks of sinus pain, green mucus, pressure, feels pain even to light touch Taking tylenol, congestion worse at night and it is worse at night Nasal spray helping at the moment Was on antibiotics with physician in her PCP office weeks ago but was light abx About 6 wk ago had the antibiotics Last time had lingering symptoms like this received a steroid injection which helped No fevers  Does have some seasonal allergies   Past Medical History:  Diagnosis Date   Allergy    Anemia    Anxiety    Arthritis    Basal cell carcinoma    Blood transfusion without reported diagnosis    Bowel perforation ([place])    Cancer of lower-inner quadrant of female breast ([place]) 11/30/2014   left   Cancer of lower-inner quadrant of left female breast ([place]) 01/01/2840   Complication of anesthesia    anxiety post-op after ear surgeries   Crohn's disease of large intestine with abscess ([place])    Dental crowns present    recent root canal 11/2014   Depression    Family history of adverse reaction to anesthesia    pt's sister has hx. of post-op N/V   GERD (gastroesophageal reflux disease)    Hyperlipidemia    Indigestion    Perianal abscess    Personal history of radiation therapy 2016   PONV (postoperative nausea and vomiting)    Runny nose 12/07/2014   clear drainage, per pt.   Seasonal allergies    TMJ  (temporomandibular joint syndrome)    Ulcerative colitis    Vitamin B12 deficiency     Patient Active Problem List   Diagnosis Date Noted   Primary insomnia 10/17/2020   Osteopenia of neck of left femur 03/26/2020   Preventative health care 03/26/2020   Closed fracture of shaft of right fibula with routine healing 02/02/2020   Hyponatremia 01/23/2020   Urinary frequency 01/23/2020   Dysphagia 01/23/2020   Acute pansinusitis 01/08/2018   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 06/02/2017 05/28/2017   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 05/21/2017   Right ear pain 05/19/2017   Dizziness 05/19/2017   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 03/24/2017   Chronic diastolic CHF (congestive heart failure) ([place]) 08/05/2016   Chest discomfort 06/21/2016   Nausea and vomiting    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   Small bowel obstruction ([place])    Ileus (HCC)    Generalized anxiety disorder 02/14/2016   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   Hypokalemia    Fever    Crohn's disease of colon with complication ([place])    Other depression due to general medical condition    Difficulty in walking, not elsewhere classified    Acute blood loss anemia    Absolute anemia    Left upper quadrant pain    Abdominal pain 01/07/2016   GI bleed 01/07/2016   IBS (irritable bowel  syndrome)    Tenesmus    Ulcerative pancolitis with rectal bleeding ([place])    Diarrhea 12/24/2015   Crohn disease ([place]) 12/24/2015   Dehydration    Ulcerative pancolitis with abscess [facility])    Rectal pain    Leucocytosis    Cancer of lower-inner quadrant of left female breast ([place]) 11/30/2014   Perirectal abscess 09/29/2013   Vitamin B12 deficiency 08/12/2006   Depression 08/12/2006    Past Surgical History:  Procedure Laterality Date   ABDOMINAL HYSTERECTOMY     partial   APPENDECTOMY     AUGMENTATION MAMMAPLASTY Bilateral    BASAL CELL CARCINOMA EXCISION Left    nose   BREAST BIOPSY  Left 2019   benign   BREAST ENHANCEMENT SURGERY     BREAST LUMPECTOMY Left 2016   BREAST LUMPECTOMY WITH RADIOACTIVE SEED AND SENTINEL LYMPH NODE BIOPSY Left 12/13/2014   Procedure: LEFT BREAST LUMPECTOMY WITH RADIOACTIVE SEED AND SENTINEL LYMPH NODE BIOPSY;  Surgeon: [name], MD;  Location: [facility];  Service: General;  Laterality: Left;   COLONOSCOPY WITH PROPOFOL  05/28/2011; 06/28/2014   EXPLORATORY LAPAROTOMY  04/08/2018   with end ileostomy takedown and creation of loop ileostomy-- [place] N/A 01/08/2016   Procedure: Beryle Quant;  Surgeon: [name], MD;  Location: [facility];  Service: Gastroenterology;  Laterality: N/A;  needs MAC due to pain and anxiety   ILEOSTOMY N/A 05/25/2017   Procedure: DILATION OF ILEOSTOMY;  Surgeon: Rolm Bookbinder, MD;  Location: [facility];  Service: General;  Laterality: N/A;   ILEOSTOMY  02/11/2016   END ileostomy.  Dr Hassell Done   ILEOSTOMY CLOSURE N/A 06/02/2017   Procedure: RESECTION OF ILEAL STRICTURE; ILEOSTOMY REVISION ;  Surgeon: Michael Boston, MD;  Location: [facility];  Service: General;  Laterality: N/A;   ILEOSTOMY DILATION  05/25/2017   DILITATION OF ILEAL STRICTURE   ileostomy stricture resection  06/02/2017   with revision   INCISION AND DRAINAGE ABSCESS  2018   INCISION AND DRAINAGE PERIRECTAL ABSCESS N/A 09/28/2013   Procedure: IRRIGATION AND DEBRIDEMENT PERIANAL ABSCESS, proctoscopy;  Surgeon: Shann Medal, MD;  Location: [facility];  Service: General;  Laterality: N/A;   INCISION AND DRAINAGE PERIRECTAL ABSCESS N/A 01/22/2016   Procedure: IRRIGATION AND DEBRIDEMENT PERIRECTAL ABSCESS;  Surgeon: Armandina Gemma, MD;  Location: [facility];  Service: General;  Laterality: N/A;   INNER EAR SURGERY Bilateral    LAPAROSCOPIC DIVERTED COLOSTOMY N/A 02/11/2016   Procedure: LAPAROSCOPIC DIVERTED ILEOSTOMY;  Surgeon: Johnathan Hausen, MD;  Location: [facility];   Service: General;  Laterality: N/A;   LAPAROSCOPY N/A 03/23/2017   Procedure: LAPAROSCOPY DIAGNOSTIC, EXPLORATORY LAPAROTOMY,  BOWEL RESECTION;  Surgeon: Jackolyn Confer, MD;  Location: [facility];  Service: General;  Laterality: N/A;     OB History   No obstetric history on file.     Family History  Problem Relation Age of Onset   Prostate cancer Brother 42   Kidney disease Brother    Diabetes Brother    Brain cancer Father    Rheum arthritis Mother    Anesthesia problems Sister        post-op N/V   Emphysema Maternal Grandfather    Asthma Sister    Rheum arthritis Brother    Esophageal cancer Neg Hx    Breast cancer Neg Hx    Rectal cancer Neg Hx    Stomach cancer Neg Hx    Colon cancer Neg Hx  Social History   Tobacco Use   Smoking status: Never   Smokeless tobacco: Never  Vaping Use   Vaping Use: Never used  Substance Use Topics   Alcohol use: Yes    Alcohol/week: 7.0 standard drinks    Types: 7 Glasses of wine per week   Drug use: No    Home Medications Prior to Admission medications   Medication Sig Start Date End Date Taking? Authorizing Provider  acetaminophen (TYLENOL) 500 MG tablet You can take 2 tablets every 6 hours as needed for pain.  Use this as your primary pain control.  Use Tramadol as a last resort.  You can buy this over the counter at any drug store.    DO NOT TAKE MORE THAN 4000 MG OF TYLENOL PER DAY.  IT CAN HARM YOUR LIVER. 03/31/17   Earnstine Regal, PA-C  azelastine (ASTELIN) 0.1 % nasal spray Place 2 sprays into both nostrils 2 (two) times daily. 06/18/20   Colon Branch, MD  cyanocobalamin (,VITAMIN B-12,) 1000 MCG/ML injection ADMINISTER 1 ML IN THE MUSCLE EVERY 30 DAYS 10/17/20   Zehr, Laban Emperor, PA-C  diphenoxylate-atropine (LOMOTIL) 2.5-0.025 MG tablet Take 1-2 tablets by mouth 3-4 times daily AS NEEDED for severe diarrhea 05/24/20   [place], Amy S, PA-C  famotidine (PEPCID) 20 MG tablet Take 1 tablet (20 mg total) by mouth 2 (two)  times daily. 05/24/20   [place], Amy S, PA-C  FLUoxetine (PROZAC) 20 MG capsule Take 1 capsule (20 mg total) by mouth daily. 03/26/20   Roma Schanz R, DO  fluticasone (FLONASE) 50 MCG/ACT nasal spray Place 2 sprays into both nostrils daily. 05/09/19   Colon Branch, MD  loperamide (IMODIUM) 2 MG capsule Take 1 capsule (2 mg total) by mouth as needed for diarrhea or loose stools. 11/02/19   Pyrtle, Lajuan Lines, MD  ondansetron (ZOFRAN-ODT) 4 MG disintegrating tablet DISSOLVE ONE TABLET BY MOUTH EVERY 6 HOURS AS NEEDED FOR NAUSEA AND VOMITING 05/24/20   [place], Amy S, PA-C  predniSONE (DELTASONE) 5 MG tablet TAKE 1 TABLET(5 MG) BY MOUTH DAILY 05/24/20   [place], Amy S, PA-C  zolpidem (AMBIEN) 10 MG tablet Take 1 tablet (10 mg total) by mouth at bedtime. 10/17/20 01/15/21  Zehr, Janett Billow D, PA-C    Allergies    Codeine, Mesalamine, Nsaids, Oxycodone, Norco [hydrocodone-acetaminophen], Other, and Sulfa antibiotics  Review of Systems   Review of Systems  Constitutional:  Negative for fever.  HENT:  Positive for congestion, sinus pressure and sinus pain. Negative for sore throat and trouble swallowing. Ear pain: popping sensation.  Eyes:  Negative for visual disturbance.  Respiratory:  Negative for cough and shortness of breath.   Cardiovascular:  Negative for chest pain.  Gastrointestinal:  Negative for abdominal pain, nausea and vomiting.  Skin:  Negative for rash.  Neurological:  Negative for headaches.   Physical Exam Updated Vital Signs BP 120/64 (BP Location: Right Arm)   Pulse 78   Temp 98 F (36.7 C) (Oral)   Resp 16   Ht _0  (1.676 m)   Wt 72.1 kg   SpO2 100%   BMI 25.66 kg/m   Physical Exam Vitals and nursing note reviewed.  Constitutional:      General: She is not in acute distress.    Appearance: Normal appearance. She is not ill-appearing, toxic-appearing or diaphoretic.  HENT:     Head: Normocephalic.     Comments: Maxillary sinus tenderness Eyes:      Conjunctiva/sclera: Conjunctivae  normal.  Cardiovascular:     Rate and Rhythm: Normal rate and regular rhythm.     Pulses: Normal pulses.  Pulmonary:     Effort: Pulmonary effort is normal. No respiratory distress.  Musculoskeletal:        General: No deformity or signs of injury.     Cervical back: No rigidity.  Skin:    General: Skin is warm and dry.     Coloration: Skin is not jaundiced or pale.  Neurological:     General: No focal deficit present.     Mental Status: She is alert and oriented to person, place, and time.    ED Results / Procedures / Treatments   Labs (all labs ordered are listed, but only abnormal results are displayed) Labs Reviewed - No data to display  EKG None  Radiology No results found.  Procedures Procedures   Medications Ordered in ED Medications  methylPREDNISolone sodium succinate (SOLU-MEDROL) 125 mg/2 mL injection 125 mg (125 mg Intramuscular Given 10/29/20 1019)    ED Course  I have reviewed the triage vital signs and the nursing notes.  Pertinent labs & imaging results that were available during my care of the patient were reviewed by me and considered in my medical decision making (see chart for details).    MDM Rules/Calculators/A&P                           80 year old female with a history of basal cell carcinoma, breast cancer, Crohn's disease with history of complications including abscess, cecal perforation with right hemicolectomy, maintained on very low-dose prednisone, hyperlipidemia, depression, recent sinusitis for which she had been on antibiotics, presents with concern for continued sinus symptoms.   Discussed that given duration of symptoms, antibiotics are reasonable for coverage however she reports she does not want antibiotics due to side effects of diarrhea and her history of Crohns, and also reports that her symptoms significantly improved following a similar episode with a shot of steroids.  Considering this history and  no signs of progression of bacterial sinusitis/no fever or symptoms of intracranial complications with history of allergic sinusitis which improved with steroids feel treatment with steroids is appropriate--she reports improvement with one injection in the past and will do this and have her continue her regular prednisone dosing and other sinus care. Discussed strict return precautions and that if she develops fever or other symptoms she will require reevaluation and likely antibiotics. Patient discharged in stable condition with understanding of reasons to return.     Final Clinical Impression(s) / ED Diagnoses Final diagnoses:  Acute recurrent maxillary sinusitis  Allergic sinusitis    Rx / DC Orders ED Discharge Orders     None        Gareth Morgan, MD 10/30/20 [phone]

## 2020-11-12 ENCOUNTER — Encounter: Payer: Self-pay | Admitting: Family Medicine

## 2020-11-12 ENCOUNTER — Ambulatory Visit (INDEPENDENT_AMBULATORY_CARE_PROVIDER_SITE_OTHER): Payer: Medicare HMO | Admitting: Family Medicine

## 2020-11-12 ENCOUNTER — Other Ambulatory Visit: Payer: Self-pay

## 2020-11-12 VITALS — BP 110/70 | HR 114 | Temp 98.9°F | Resp 20 | Ht 66.0 in | Wt 158.6 lb

## 2020-11-12 DIAGNOSIS — J324 Chronic pansinusitis: Secondary | ICD-10-CM

## 2020-11-12 DIAGNOSIS — E871 Hypo-osmolality and hyponatremia: Secondary | ICD-10-CM

## 2020-11-12 LAB — COMPREHENSIVE METABOLIC PANEL
ALT: 10 U/L (ref 0–35)
AST: 15 U/L (ref 0–37)
Albumin: 4 g/dL (ref 3.5–5.2)
Alkaline Phosphatase: 92 U/L (ref 39–117)
BUN: 16 mg/dL (ref 6–23)
CO2: 25 mEq/L (ref 19–32)
Calcium: 9.4 mg/dL (ref 8.4–10.5)
Chloride: 98 mEq/L (ref 96–112)
Creatinine, Ser: 0.97 mg/dL (ref 0.40–1.20)
GFR: 55.76 mL/min — ABNORMAL LOW (ref >60.00)
Glucose, Bld: 103 mg/dL — ABNORMAL HIGH (ref 70–99)
Potassium: 4.7 mEq/L (ref 3.5–5.1)
Sodium: 132 mEq/L — ABNORMAL LOW (ref 135–145)
Total Bilirubin: 0.4 mg/dL (ref 0.2–1.2)
Total Protein: 6.6 g/dL (ref 6.0–8.3)

## 2020-11-12 LAB — HEMOGLOBIN A1C: Hgb A1c MFr Bld: 5.5 % (ref 4.6–6.5)

## 2020-11-12 MED ORDER — FLUTICASONE PROPIONATE 50 MCG/ACT NA SUSP: 2.0000 | Freq: Every day | NASAL | 6 refills | Status: DC

## 2020-11-12 MED ORDER — CLARITHROMYCIN ER 500 MG PO TB24: 1000.0000 mg | ORAL_TABLET | Freq: Every day | ORAL | 0 refills | Status: DC

## 2020-11-12 NOTE — Progress Notes (Addendum)
Patient ID: Sara Barnes, female    DOB: Aug 19, 1940  Age: 80 y.o. MRN: [id number]    Subjective:  Subjective  HPI Sara Barnes presents for a hospital f/u today accompanied by her sister. She complains of congestion, sinus pressure and pain, facial swelling, weakness, nose bleed, HA, and weakness. Pt was at the ED on 10/29/2020 for sinus congestion. She notes that she had not improved since her visit to the ED. She reports that her sxs worsen x3 weeks and with steroid injection. She states that she is unable to function properly. She notes taking tylenol for the pain. She reports having one episode of nose bleed and had seen blood, when she blown her nose. She endorses using Zicam, however she had not used any Flonase recently. CBC on 10/17/2020 indicated elevated WBC (13.7) She also complains of forgetfulness. She notes that she has been in a "fog" x3 weeks.  She also complains of L ear popping. She denies any pain in the L ear.  She also complains possible abscess on her upper L teeth and upper L teeth pain.  Pt is planning on seeing dentist.  Pt notes that her diarrhea is baseline. She notes taking Z-pak gave worsen her diarrhea and didn't relieved her sxs. Basic metabolic panel on 94/50/3888 indicated low sodium level (130) and low GFR (58.67). She denies any chest pain, SOB, fever, abdominal pain, cough, chills, sore throat, dysuria, urinary incontinence, or back pain at this time.   Review of Systems  Constitutional:  Negative for chills, fatigue and fever.  HENT:  Positive for congestion, facial swelling, nosebleeds, sinus pressure (L) and sinus pain (L). Negative for ear pain, rhinorrhea, sore throat and tinnitus.        (+) upper L teeth pain  (+) abscess L upper teeth  (+) L ear popping   Eyes:  Negative for pain.  Respiratory:  Negative for cough, shortness of breath and wheezing.   Cardiovascular:  Negative for chest pain.  Gastrointestinal:  Negative for abdominal pain, anal  bleeding, constipation, diarrhea, nausea and vomiting.  Genitourinary:  Negative for flank pain.  Musculoskeletal:  Negative for back pain and neck pain.  Skin:  Negative for rash.  Neurological:  Positive for weakness and headaches (L temple). Negative for seizures, light-headedness and numbness.  Psychiatric/Behavioral:         (+) forgetfulness    History Past Medical History:  Diagnosis Date   Allergy    Anemia    Anxiety    Arthritis    Basal cell carcinoma    Blood transfusion without reported diagnosis    Bowel perforation ([place])    Cancer of lower-inner quadrant of female breast ([place]) 11/30/2014   left   Cancer of lower-inner quadrant of left female breast ([place]) 06/05/32   Complication of anesthesia    anxiety post-op after ear surgeries   Crohn's disease of large intestine with abscess ([place])    Dental crowns present    recent root canal 11/2014   Depression    Family history of adverse reaction to anesthesia    pt's sister has hx. of post-op N/V   GERD (gastroesophageal reflux disease)    Hyperlipidemia    Indigestion    Perianal abscess    Personal history of radiation therapy 2016   PONV (postoperative nausea and vomiting)    Runny nose 12/07/2014   clear drainage, per pt.   Seasonal allergies    TMJ (temporomandibular joint syndrome)    Ulcerative  colitis    Vitamin B12 deficiency     She has a past surgical history that includes Abdominal hysterectomy; Appendectomy; Breast enhancement surgery; Incision and drainage perirectal abscess (N/A, 09/28/2013); Inner ear surgery (Bilateral); Colonoscopy with propofol (05/28/2011; 06/28/2014); Breast lumpectomy with radioactive seed and sentinel lymph node biopsy (Left, 12/13/2014); Excision basal cell carcinoma (Left); Flexible sigmoidoscopy (N/A, 01/08/2016); Incision and drainage perirectal abscess (N/A, 01/22/2016); Laparoscopic diverted colostomy (N/A, 02/11/2016); Incision and drainage abscess (2018); laparoscopy (N/A,  03/23/2017); Ileostomy (N/A, 05/25/2017); Ileostomy (02/11/2016); ILEOSTOMY DILATION (05/25/2017); Ileostomy closure (N/A, 06/02/2017); 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   Her family history includes Anesthesia problems in her sister; Asthma in her sister; Brain cancer in her father; Diabetes in her brother; Emphysema in her maternal grandfather; Kidney disease in her brother; Prostate cancer (age of onset: 66) in her brother; Rheum arthritis in her brother and mother.She reports that she has never smoked. She has never used smokeless tobacco. She reports current alcohol use of about 7.0 standard drinks of alcohol per week. She reports that she does not use drugs.  Current Outpatient Medications on File Prior to Visit  Medication Sig Dispense Refill   acetaminophen (TYLENOL) 500 MG tablet You can take 2 tablets every 6 hours as needed for pain.  Use this as your primary pain control.  Use Tramadol as a last resort.  You can buy this over the counter at any drug store.    DO NOT TAKE MORE THAN 4000 MG OF TYLENOL PER DAY.  IT CAN HARM YOUR LIVER. 30 tablet 0   cyanocobalamin (,VITAMIN B-12,) 1000 MCG/ML injection ADMINISTER 1 ML IN THE MUSCLE EVERY 30 DAYS 3 mL 0   diphenoxylate-atropine (LOMOTIL) 2.5-0.025 MG tablet Take 1-2 tablets by mouth 3-4 times daily AS NEEDED for severe diarrhea 240 tablet 6   famotidine (PEPCID) 20 MG tablet Take 1 tablet (20 mg total) by mouth 2 (two) times daily. 180 tablet 6   FLUoxetine (PROZAC) 20 MG capsule Take 1 capsule (20 mg total) by mouth daily. 90 capsule 3   loperamide (IMODIUM) 2 MG capsule Take 1 capsule (2 mg total) by mouth as needed for diarrhea or loose stools. 90 capsule 1   ondansetron (ZOFRAN-ODT) 4 MG disintegrating tablet DISSOLVE ONE TABLET BY MOUTH EVERY 6 HOURS AS NEEDED FOR NAUSEA AND VOMITING 90 tablet 6    predniSONE (DELTASONE) 5 MG tablet TAKE 1 TABLET(5 MG) BY MOUTH DAILY 90 tablet 1   zolpidem (AMBIEN) 10 MG tablet Take 1 tablet (10 mg total) by mouth at bedtime. 90 tablet 0   No current facility-administered medications on file prior to visit.     Objective:  Objective  Physical Exam Vitals and nursing note reviewed.  Constitutional:      General: She is not in acute distress.    Appearance: Normal appearance. She is well-developed. She is not ill-appearing.  HENT:     Head: Normocephalic and atraumatic.     Right Ear: External ear normal.     Left Ear: External ear normal.     Nose:     Left Sinus: Maxillary sinus tenderness and frontal sinus tenderness present.     Comments: There is L sinus tenderness present.     Mouth/Throat:     Comments: There is odontalgia present in the L upper quadrant of the mouth.  Eyes:     General:  Right eye: No discharge.        Left eye: No discharge.     Extraocular Movements: Extraocular movements intact.     Pupils: Pupils are equal, round, and reactive to light.  Cardiovascular:     Rate and Rhythm: Normal rate and regular rhythm.     Pulses: Normal pulses.     Heart sounds: Normal heart sounds. No murmur heard.   No friction rub. No gallop.  Pulmonary:     Effort: Pulmonary effort is normal. No respiratory distress.     Breath sounds: Normal breath sounds. No stridor. No wheezing, rhonchi or rales.  Chest:     Chest wall: No tenderness.  Abdominal:     General: Bowel sounds are normal. There is no distension.     Palpations: Abdomen is soft. There is no mass.     Tenderness: There is no abdominal tenderness. There is no guarding or rebound.     Hernia: No hernia is present.  Musculoskeletal:        General: Normal range of motion.     Cervical back: Normal range of motion and neck supple.     Right lower leg: No edema.     Left lower leg: No edema.  Skin:    General: Skin is warm and dry.  Neurological:     Mental  Status: She is alert and oriented to person, place, and time.  Psychiatric:        Behavior: Behavior normal.        Thought Content: Thought content normal.   BP 110/70 (BP Location: Right Arm, Patient Position: Sitting, Cuff Size: Normal)   Pulse (!) 114   Temp 98.9 F (37.2 C) (Oral)   Resp 20   Ht 5' 6"  (1.676 m)   Wt 158 lb 9.6 oz (71.9 kg)   SpO2 96%   BMI 25.60 kg/m  Wt Readings from Last 3 Encounters:  11/12/20 158 lb 9.6 oz (71.9 kg)  10/29/20 159 lb (72.1 kg)  10/17/20 159 lb 9.6 oz (72.4 kg)     Lab Results  Component Value Date   WBC 13.7 (H) 10/17/2020   HGB 14.8 10/17/2020   HCT 43.4 10/17/2020   PLT 277.0 10/17/2020   GLUCOSE 88 10/25/2020   CHOL 188 03/26/2020   TRIG 203.0 (H) 03/26/2020   HDL 67.80 03/26/2020   LDLDIRECT 100.0 03/26/2020   LDLCALC 70 10/02/2016   ALT 12 10/17/2020   AST 17 10/17/2020   NA 130 (L) 10/25/2020   K 3.9 10/25/2020   CL 99 10/25/2020   CREATININE 0.93 10/25/2020   BUN 12 10/25/2020   CO2 25 10/25/2020   TSH 1.42 02/09/2020   INR 0.9 09/11/2017   HGBA1C 5.5 12/18/2010   MICROALBUR <0.7 01/30/2015    No results found.   Assessment & Plan:  Plan   Meds ordered this encounter  Medications   fluticasone (FLONASE) 50 MCG/ACT nasal spray    Sig: Place 2 sprays into both nostrils daily.    Dispense:  16 g    Refill:  6   clarithromycin (BIAXIN XL) 500 MG 24 hr tablet    Sig: Take 2 tablets (1,000 mg total) by mouth daily.    Dispense:  20 tablet    Refill:  0    Problem List Items Addressed This Visit       Unprioritized   Chronic pansinusitis - Primary    abx per orders flonase Consider Ct sinuses prn  Relevant Medications   fluticasone (FLONASE) 50 MCG/ACT nasal spray   clarithromycin (BIAXIN XL) 500 MG 24 hr tablet   Hyponatremia    Check labs  Consider nephrology referral         Follow-up: Return if symptoms worsen or fail to improve.  I,Gordon Zheng,acting as a Education administrator for [employer], DO.,have documented all relevant documentation on the behalf of Ann Held, DO,as directed by  Ann Held, DO while in the presence of [place], DO, have reviewed all documentation for this visit. The documentation on 11/12/20 for the exam, diagnosis, procedures, and orders are all accurate and complete.

## 2020-11-12 NOTE — Assessment & Plan Note (Signed)
Check labs  Consider nephrology referral

## 2020-11-12 NOTE — Patient Instructions (Addendum)
Hyponatremia Hyponatremia is when the amount of salt (sodium) in your blood is too low. When sodium levels are low, your cells absorb extra water, which causes them to swell. The swelling happens throughout the body,but it mostly affects the brain. What are the causes? This condition may be caused by: Certain medical conditions, such as: Heart, kidney, or liver problems. Thyroid problems. Adrenal gland problems. Metabolic conditions, such as Addison disease or syndrome of inappropriate antidiuretic hormone (SIADH). Severe vomiting or diarrhea. Certain medicines or illegal drugs. Dehydration. Drinking too much water. Eating a diet that is low in sodium. Large burns on your body. Excessive sweating. What increases the risk? You are more likely to develop this condition if you: Have long-term (chronic) kidney disease. Have heart failure. Have a medical condition that causes frequent or excessive diarrhea. Participate in intense physical activities, such as marathon running. Take certain medicines that affect the sodium and fluid balance in the blood. Some of these medicine types include: Diuretics. NSAIDs. Some opioid pain medicines. Some antidepressants. Some seizure prevention medicines. What are the signs or symptoms? Symptoms of this condition include: Headache. Nausea and vomiting. Being very tired (lethargic). Muscle weakness and cramping. Loss of appetite. Feeling weak or light-headed. Severe symptoms of this condition include: Confusion. Agitation. Having a rapid heart rate. Passing out (fainting). Seizures. Coma. How is this diagnosed? This condition is diagnosed based on: A physical exam. Your medical history. Tests, including: Blood tests. Urine tests. How is this treated? Treatment for this condition depends on the cause. Treatment may include: Getting fluids through an IV that is inserted into one of your veins. Medicines to correct the sodium imbalance.  If medicines are causing the condition, the medicines will need to be adjusted. Limiting your water or fluid intake to get the correct sodium balance. Monitoring in the hospital setting to closely watch your symptoms for improvement. Follow these instructions at home:  Take over-the-counter and prescription medicines only as told by your health care provider. Many medicines can make this condition worse. Talk with your health care provider about any medicines that you are currently taking. Carefully follow a recommended diet as told by your health care provider. Carefully follow instructions from your health care provider about fluid restrictions. Do not drink alcohol. Keep all follow-up visits as told by your health care provider. This is important. Contact a health care provider if: You develop worsening nausea, fatigue, headache, confusion, or weakness. Your symptoms go away and then return. You have problems following the recommended diet. Get help right away if: You have a seizure. You pass out. You have ongoing diarrhea or vomiting. Summary Hyponatremia is when the amount of salt (sodium) in your blood is too low. When sodium levels are low, your cells absorb extra water, which causes them to swell. The swelling happens throughout the body, but it mostly affects the brain. Treatment for this condition depends on the cause. It may include IV fluids, medicines, and limiting your fluid intake. This information is not intended to replace advice given to you by your health care provider. Make sure you discuss any questions you have with your healthcare provider. Document Revised: 02/26/2018 Document Reviewed: 02/26/2018 Elsevier Patient Education  2022 [employer]. Hyponatremia Hyponatremia is when the amount of salt (sodium) in your blood is too low. When sodium levels are low, your cells absorb extra water, which causes them to swell. The swelling happens throughout the body,but it  mostly affects the brain. What are the causes? This condition  may be caused by: Certain medical conditions, such as: Heart, kidney, or liver problems. Thyroid problems. Adrenal gland problems. Metabolic conditions, such as Addison disease or syndrome of inappropriate antidiuretic hormone (SIADH). Severe vomiting or diarrhea. Certain medicines or illegal drugs. Dehydration. Drinking too much water. Eating a diet that is low in sodium. Large burns on your body. Excessive sweating. What increases the risk? You are more likely to develop this condition if you: Have long-term (chronic) kidney disease. Have heart failure. Have a medical condition that causes frequent or excessive diarrhea. Participate in intense physical activities, such as marathon running. Take certain medicines that affect the sodium and fluid balance in the blood. Some of these medicine types include: Diuretics. NSAIDs. Some opioid pain medicines. Some antidepressants. Some seizure prevention medicines. What are the signs or symptoms? Symptoms of this condition include: Headache. Nausea and vomiting. Being very tired (lethargic). Muscle weakness and cramping. Loss of appetite. Feeling weak or light-headed. Severe symptoms of this condition include: Confusion. Agitation. Having a rapid heart rate. Passing out (fainting). Seizures. Coma. How is this diagnosed? This condition is diagnosed based on: A physical exam. Your medical history. Tests, including: Blood tests. Urine tests. How is this treated? Treatment for this condition depends on the cause. Treatment may include: Getting fluids through an IV that is inserted into one of your veins. Medicines to correct the sodium imbalance. If medicines are causing the condition, the medicines will need to be adjusted. Limiting your water or fluid intake to get the correct sodium balance. Monitoring in the hospital setting to closely watch your symptoms for  improvement. Follow these instructions at home:  Take over-the-counter and prescription medicines only as told by your health care provider. Many medicines can make this condition worse. Talk with your health care provider about any medicines that you are currently taking. Carefully follow a recommended diet as told by your health care provider. Carefully follow instructions from your health care provider about fluid restrictions. Do not drink alcohol. Keep all follow-up visits as told by your health care provider. This is important. Contact a health care provider if: You develop worsening nausea, fatigue, headache, confusion, or weakness. Your symptoms go away and then return. You have problems following the recommended diet. Get help right away if: You have a seizure. You pass out. You have ongoing diarrhea or vomiting. Summary Hyponatremia is when the amount of salt (sodium) in your blood is too low. When sodium levels are low, your cells absorb extra water, which causes them to swell. The swelling happens throughout the body, but it mostly affects the brain. Treatment for this condition depends on the cause. It may include IV fluids, medicines, and limiting your fluid intake. This information is not intended to replace advice given to you by your health care provider. Make sure you discuss any questions you have with your healthcare provider. Document Revised: 02/26/2018 Document Reviewed: 02/26/2018 Elsevier Patient Education  2022 [employer].

## 2020-11-12 NOTE — Assessment & Plan Note (Signed)
abx per orders flonase Consider Ct sinuses prn

## 2020-11-13 LAB — SODIUM, URINE, RANDOM: Sodium, Ur: 54 mmol/L (ref 28–272)

## 2020-11-14 LAB — OSMOLALITY: Osmolality: 284 mOsm/kg (ref 278–305)

## 2020-11-15 ENCOUNTER — Encounter: Payer: Self-pay | Admitting: Neurology

## 2020-11-15 ENCOUNTER — Ambulatory Visit: Payer: Commercial Managed Care - HMO | Admitting: Neurology

## 2020-11-15 VITALS — BP 128/73 | HR 73 | Ht 66.0 in | Wt 159.0 lb

## 2020-11-15 DIAGNOSIS — E079 Disorder of thyroid, unspecified: Secondary | ICD-10-CM | POA: Diagnosis not present

## 2020-11-15 DIAGNOSIS — R519 Headache, unspecified: Secondary | ICD-10-CM | POA: Diagnosis not present

## 2020-11-15 DIAGNOSIS — R7989 Other specified abnormal findings of blood chemistry: Secondary | ICD-10-CM | POA: Diagnosis not present

## 2020-11-15 DIAGNOSIS — R799 Abnormal finding of blood chemistry, unspecified: Secondary | ICD-10-CM | POA: Diagnosis not present

## 2020-11-15 MED ORDER — GABAPENTIN 100 MG PO CAPS: 300.0000 mg | ORAL_CAPSULE | Freq: Three times a day (TID) | ORAL | 6 refills | Status: DC

## 2020-11-15 NOTE — Patient Instructions (Signed)
Meds ordered this encounter  Medications   gabapentin (NEURONTIN) 100 MG capsule    Sig: Take 3 capsules (300 mg total) by mouth 3 (three) times daily.    Dispense:  270 capsule    Refill:  6

## 2020-11-15 NOTE — Progress Notes (Signed)
Chief Complaint  Patient presents with   New Patient (Initial Visit)    New patient/facial pain Room 52, sister Vaughan Basta in room      [place] is a 80 y.o. female   Left facial pain  In the territory of left V2  But the history does not consistent with typical trigeminal neuralgia  Not MRI candidate due to history of inner ear pain for hearing loss, proceed with CT of the brain, including maxillary sinus region to rule out left maxillary sinus/soft tissue pathology, had a history of left cheek area basal cell carcinoma surgery in May 2022  Gabapentin 100 mg tablets, titrating to 9 tablets daily, divided into 2-3 doses  Thyroid functional test  Return to clinic in 5 to 6 weeks  Gait abnormality  Evidence of mild length dependent sensory changes, absent ankle reflex, wide-based cautious gait likely multifactorial, including peripheral neuropathy, she had long history of Crohn's, B12 deficiency     DIAGNOSTIC DATA (LABS, IMAGING, TESTING) - I reviewed patient records, labs, notes, testing and imaging myself where available.  Laboratory evaluation July 2022: CMP showed sodium of 132, creatinine of 0.97, A1c of 5.5, B12 779, normal ESR C-reactive protein twice, on October 17, 2020 and January 27   MEDICAL HISTORY:  Sara Barnes is a 80 year old female, seen in request by Dr. Merrilee Jansky for evaluation of severe left facial pain, she is accompanied by her sister Vaughan Basta at today's visit on November 15, 2020, her primary care physician is Dr. Carollee Herter, Alferd Apa, DO   I reviewed and summarized the referring note. PMHX. Crohn disease, had multiple treatment in the past, currently taking low-dose of prednisone 5 mg daily, previously also had large intestinal abscess, required surgery and ileostomy in 2019, she still has frequent diarrhea, History of left breast cancer, status postsurgery in 2016 followed by radiation therapy  She had remaining dental work done  in the past, often felt little bit discomfort at the left cheek area, about a month after her left cheek area basal cell carcinoma surgery, she began to develop worsening left cheek area pain,  She described deep achy pain at left upper jaw since June 2022, sometimes the pain was so severe, she felt the radiating pain across her upper teeth but mainly on the left side, pounding, difficulty sleeping, radiating to left temporal region," 2 nights ago, I was in dire pain, I hope they took out my teeth"  Today she only has mild discomfort in the same territory, did take morning dose of Tylenol  She had long history of hearing loss, had a history of inner ear implant, not MRI candidate, still has hearing loss despite wearing hearing aid     PHYSICAL EXAM:   Vitals:   11/15/20 1313  BP: 128/73  Pulse: 73  Weight: 159 lb (72.1 kg)  Height: _0  (1.676 m)   Not recorded     Body mass index is 25.66 kg/m.  PHYSICAL EXAMNIATION:  Gen: NAD, conversant, well nourised, well groomed                     Cardiovascular: Regular rate rhythm, no peripheral edema, warm, nontender. Eyes: Conjunctivae clear without exudates or hemorrhage Neck: Supple, no carotid bruits. Pulmonary: Clear to auscultation bilaterally   NEUROLOGICAL EXAM:  MENTAL STATUS: Speech:    Speech is normal; fluent and spontaneous with normal comprehension.  Cognition:     Orientation to time,  place and person     Normal recent and remote memory     Normal Attention span and concentration     Normal Language, naming, repeating,spontaneous speech     Fund of knowledge   CRANIAL NERVES: CN II: Visual fields are full to confrontation. Pupils are round equal and briskly reactive to light. CN III, IV, VI: extraocular movement are normal. No ptosis. CN V: Facial sensation is intact to light touch CN VII: Face is symmetric with normal eye closure, bilateral corneal reflex were present and symmetric, well-healed left cheek  area longitudinal scar along the ridge of nose, no significant tenderness upon deep palpitation CN VIII: Hard of hearing CN IX, X: Phonation is normal. CN XI: Head turning and shoulder shrug are intact  MOTOR: There is no pronator drift of out-stretched arms. Muscle bulk and tone are normal. Muscle strength is normal.  REFLEXES: Reflexes are 2+ and symmetric at the biceps, triceps, knees, and absent at ankles. Plantar responses are flexor.  SENSORY: Decreased vibratory sensation at toes, length dependent decreased light touch, pinprick to above ankle level  COORDINATION: There is no trunk or limb dysmetria noted.  GAIT/STANCE: She can get up from seated position arm crossed, wide-based, cautious, difficulty standing up on tiptoe and heels, mildly unsteady,  REVIEW OF SYSTEMS:  Full 14 system review of systems performed and notable only for as above All other review of systems were negative.   ALLERGIES: Allergies  Allergen Reactions   Codeine Nausea And Vomiting   Mesalamine Nausea And Vomiting   Nsaids     Crohn's disease = NO NSAIDs   Oxycodone Itching and Rash   Norco [Hydrocodone-Acetaminophen] Nausea Only   Other Itching    PERFUMED LOTIONS  Cant have MRI due to ear implant    Sulfa Antibiotics Rash    HOME MEDICATIONS: Current Outpatient Medications  Medication Sig Dispense Refill   acetaminophen (TYLENOL) 500 MG tablet You can take 2 tablets every 6 hours as needed for pain.  Use this as your primary pain control.  Use Tramadol as a last resort.  You can buy this over the counter at any drug store.    DO NOT TAKE MORE THAN 4000 MG OF TYLENOL PER DAY.  IT CAN HARM YOUR LIVER. 30 tablet 0   clarithromycin (BIAXIN XL) 500 MG 24 hr tablet Take 2 tablets (1,000 mg total) by mouth daily. 20 tablet 0   cyanocobalamin (,VITAMIN B-12,) 1000 MCG/ML injection ADMINISTER 1 ML IN THE MUSCLE EVERY 30 DAYS 3 mL 0   diphenoxylate-atropine (LOMOTIL) 2.5-0.025 MG tablet Take 1-2  tablets by mouth 3-4 times daily AS NEEDED for severe diarrhea 240 tablet 6   famotidine (PEPCID) 20 MG tablet Take 1 tablet (20 mg total) by mouth 2 (two) times daily. 180 tablet 6   FLUoxetine (PROZAC) 20 MG capsule Take 1 capsule (20 mg total) by mouth daily. 90 capsule 3   fluticasone (FLONASE) 50 MCG/ACT nasal spray Place 2 sprays into both nostrils daily. 16 g 6   loperamide (IMODIUM) 2 MG capsule Take 1 capsule (2 mg total) by mouth as needed for diarrhea or loose stools. 90 capsule 1   ondansetron (ZOFRAN-ODT) 4 MG disintegrating tablet DISSOLVE ONE TABLET BY MOUTH EVERY 6 HOURS AS NEEDED FOR NAUSEA AND VOMITING 90 tablet 6   predniSONE (DELTASONE) 5 MG tablet TAKE 1 TABLET(5 MG) BY MOUTH DAILY 90 tablet 1   zolpidem (AMBIEN) 10 MG tablet Take 1 tablet (10 mg total) by mouth  at bedtime. 90 tablet 0   No current facility-administered medications for this visit.    PAST MEDICAL HISTORY: Past Medical History:  Diagnosis Date   Allergy    Anemia    Anxiety    Arthritis    Basal cell carcinoma    Blood transfusion without reported diagnosis    Bowel perforation ([place])    Cancer of lower-inner quadrant of female breast ([place]) 11/30/2014   left   Cancer of lower-inner quadrant of left female breast ([place]) 11/26/1912   Complication of anesthesia    anxiety post-op after ear surgeries   Crohn's disease of large intestine with abscess ([place])    Dental crowns present    recent root canal 11/2014   Depression    Family history of adverse reaction to anesthesia    pt's sister has hx. of post-op N/V   GERD (gastroesophageal reflux disease)    Hyperlipidemia    Indigestion    Perianal abscess    Personal history of radiation therapy 2016   PONV (postoperative nausea and vomiting)    Runny nose 12/07/2014   clear drainage, per pt.   Seasonal allergies    TMJ (temporomandibular joint syndrome)    Ulcerative colitis    Vitamin B12 deficiency     PAST SURGICAL HISTORY: Past Surgical  History:  Procedure Laterality Date   ABDOMINAL HYSTERECTOMY     partial   APPENDECTOMY     AUGMENTATION MAMMAPLASTY Bilateral    BASAL CELL CARCINOMA EXCISION Left    nose   BREAST BIOPSY Left 2019   benign   BREAST ENHANCEMENT SURGERY     BREAST LUMPECTOMY Left 2016   BREAST LUMPECTOMY WITH RADIOACTIVE SEED AND SENTINEL LYMPH NODE BIOPSY Left 12/13/2014   Procedure: LEFT BREAST LUMPECTOMY WITH RADIOACTIVE SEED AND SENTINEL LYMPH NODE BIOPSY;  Surgeon: [name], MD;  Location: [facility];  Service: General;  Laterality: Left;   COLONOSCOPY WITH PROPOFOL  05/28/2011; 06/28/2014   EXPLORATORY LAPAROTOMY  04/08/2018   with end ileostomy takedown and creation of loop ileostomy-- [facility] [place] N/A 01/08/2016   Procedure: Beryle Quant;  Surgeon: [name], MD;  Location: [facility];  Service: Gastroenterology;  Laterality: N/A;  needs MAC due to pain and anxiety   ILEOSTOMY N/A 05/25/2017   Procedure: DILATION OF ILEOSTOMY;  Surgeon: Rolm Bookbinder, MD;  Location: [facility];  Service: General;  Laterality: N/A;   ILEOSTOMY  02/11/2016   END ileostomy.  Dr Hassell Done   ILEOSTOMY CLOSURE N/A 06/02/2017   Procedure: RESECTION OF ILEAL STRICTURE; ILEOSTOMY REVISION ;  Surgeon: Michael Boston, MD;  Location: [facility];  Service: General;  Laterality: N/A;   ILEOSTOMY DILATION  05/25/2017   DILITATION OF ILEAL STRICTURE   ileostomy stricture resection  06/02/2017   with revision   INCISION AND DRAINAGE ABSCESS  2018   INCISION AND DRAINAGE PERIRECTAL ABSCESS N/A 09/28/2013   Procedure: IRRIGATION AND DEBRIDEMENT PERIANAL ABSCESS, proctoscopy;  Surgeon: Shann Medal, MD;  Location: [facility];  Service: General;  Laterality: N/A;   INCISION AND DRAINAGE PERIRECTAL ABSCESS N/A 01/22/2016   Procedure: IRRIGATION AND DEBRIDEMENT PERIRECTAL ABSCESS;  Surgeon: Armandina Gemma, MD;  Location: [facility];  Service: General;   Laterality: N/A;   INNER EAR SURGERY Bilateral    LAPAROSCOPIC DIVERTED COLOSTOMY N/A 02/11/2016   Procedure: LAPAROSCOPIC DIVERTED ILEOSTOMY;  Surgeon: Johnathan Hausen, MD;  Location: [facility];  Service: General;  Laterality: N/A;  LAPAROSCOPY N/A 03/23/2017   Procedure: LAPAROSCOPY DIAGNOSTIC, EXPLORATORY LAPAROTOMY,  BOWEL RESECTION;  Surgeon: Jackolyn Confer, MD;  Location: [facility];  Service: General;  Laterality: N/A;    FAMILY HISTORY: Family History  Problem Relation Age of Onset   Prostate cancer Brother 73   Kidney disease Brother    Diabetes Brother    Brain cancer Father    Rheum arthritis Mother    Anesthesia problems Sister        post-op N/V   Emphysema Maternal Grandfather    Asthma Sister    Rheum arthritis Brother    Esophageal cancer Neg Hx    Breast cancer Neg Hx    Rectal cancer Neg Hx    Stomach cancer Neg Hx    Colon cancer Neg Hx     SOCIAL HISTORY: Social History   Socioeconomic History   Marital status: Widowed    Spouse name: Not on file   Number of children: 1   Years of education: Not on file   Highest education level: Not on file  Occupational History   Occupation: Warden/ranger    Comment: retired at age 25  Tobacco Use   Smoking status: Never   Smokeless tobacco: Never  Vaping Use   Vaping Use: Never used  Substance and Sexual Activity   Alcohol use: Yes    Alcohol/week: 7.0 standard drinks    Types: 7 Glasses of wine per week   Drug use: No   Sexual activity: Not Currently    Partners: Male  Other Topics Concern   Not on file  Social History Narrative   Lives alone in own home   Right Handed    Drinks caffeine occasionally   Social Determinants of Health   Financial Resource Strain: Not on file  Food Insecurity: Not on file  Transportation Needs: Not on file  Physical Activity: Not on file  Stress: Not on file  Social Connections: Not on file  Intimate Partner Violence: Not on file      Marcial Pacas, M.D.  Ph.D.  [facility] [address] [place], [place] [address] Ph: [phone] Fax: [phone]  CC:  Merrilee Jansky [address] [place],  [place] [address]  Ann Held, DO

## 2020-11-16 ENCOUNTER — Telehealth: Payer: Self-pay

## 2020-11-16 LAB — THYROID PANEL WITH TSH
Free Thyroxine Index: 1.8 (ref 1.2–4.9)
T3 Uptake Ratio: 30 % (ref 24–39)
T4, Total: 5.9 ug/dL (ref 4.5–12.0)
TSH: 1.17 u[IU]/mL (ref 0.450–4.500)

## 2020-11-16 NOTE — Telephone Encounter (Signed)
Patient's sister states they need the results called to them over the phone from here on out because they dont have access to [place] .       [phone]

## 2020-11-19 ENCOUNTER — Other Ambulatory Visit: Payer: Self-pay | Admitting: Family Medicine

## 2020-11-19 DIAGNOSIS — Z1231 Encounter for screening mammogram for malignant neoplasm of breast: Secondary | ICD-10-CM

## 2020-11-21 NOTE — Telephone Encounter (Signed)
LVM to return call.

## 2020-11-22 NOTE — Telephone Encounter (Signed)
Patient called back

## 2020-11-27 NOTE — Telephone Encounter (Signed)
Spoke with patient. See lab results

## 2020-11-28 ENCOUNTER — Telehealth: Payer: Self-pay | Admitting: *Deleted

## 2020-11-28 DIAGNOSIS — E871 Hypo-osmolality and hyponatremia: Secondary | ICD-10-CM

## 2020-11-28 NOTE — Telephone Encounter (Addendum)
Normal  Water restriction may help Refer to nephrology if pt agrees

## 2020-11-28 NOTE — Telephone Encounter (Signed)
  Lowne Chase, Yvonne R, DO  P Team Lowne-Chase 800 ml   With water restriction how much would you recommend patient drink daily?   Sanda Linger, CMA  11/21/2020 10:00 AM EDT      Spoke with patient. Pt states she has so much going on and was wondering if the referral is necessary and if you could provide treatment? Pt states having somuch to do and seeing so many doctors already. Please advise

## 2020-11-28 NOTE — Telephone Encounter (Signed)
Spoke with sister Sara Barnes advised on the 880m with the water restriction.  Since this was last week, I guess she did not remember the conversation.  Would like to clarfiy water restriction will help with what?  Is it just 8034mof water or does this includes other liquids cause pt drinks tea, gatorade, etc.  Also she has been going to so many doctors and would like to see if you can manage this instead of her going to the nephrologist?  Please send back to me.

## 2020-12-03 NOTE — Telephone Encounter (Signed)
Patient sister Vaughan Basta, notified.

## 2020-12-03 NOTE — Addendum Note (Signed)
Addended by: Kem Boroughs D on: 12/03/2020 10:55 AM   Modules accepted: Orders

## 2020-12-08 ENCOUNTER — Other Ambulatory Visit: Payer: Self-pay

## 2020-12-08 ENCOUNTER — Ambulatory Visit
Admission: RE | Admit: 2020-12-08 | Discharge: 2020-12-08 | Disposition: A | Discharge status: Home / Self Care | Payer: Medicare HMO | Source: Ambulatory Visit | Attending: Neurology | Admitting: Neurology

## 2020-12-08 DIAGNOSIS — R519 Headache, unspecified: Secondary | ICD-10-CM

## 2020-12-21 ENCOUNTER — Other Ambulatory Visit: Payer: Self-pay | Admitting: Gastroenterology

## 2020-12-21 DIAGNOSIS — E538 Deficiency of other specified B group vitamins: Secondary | ICD-10-CM

## 2021-01-01 ENCOUNTER — Encounter: Payer: Self-pay | Admitting: Neurology

## 2021-01-01 ENCOUNTER — Ambulatory Visit: Payer: Medicare HMO | Admitting: Neurology

## 2021-01-01 VITALS — BP 122/78 | HR 81 | Ht 66.0 in | Wt 161.0 lb

## 2021-01-01 DIAGNOSIS — R519 Headache, unspecified: Secondary | ICD-10-CM | POA: Diagnosis not present

## 2021-01-01 NOTE — Progress Notes (Signed)
Chief Complaint  Patient presents with   Follow-up    New room, with sister. Facial pain doing better on gabapentin. She takes the gabapentin PRN because it makes her sleepy.      ASSESSMENT AND PLAN  Sara Barnes is a 80 y.o. female   Left facial pain  In the territory of left V2  But the history does not consistent with typical trigeminal neuralgia  Not MRI candidate due to history of inner ear pain for hearing loss, CT head without contrast in August 2022 showed no significant abnormality  Gabapentin has been very helpful, she complains of mild side effect, taking maximum 600 mg daily, works well for her left facial pain,  may continue refills through her primary care physician  Gait abnormality  Evidence of slight length dependent sensory changes, absent ankle reflex, wide-based cautious gait likely multifactorial, including peripheral neuropathy, she had long history of Crohn's, B12 deficiency, aging deconditioning likely playing a role as well     DIAGNOSTIC DATA (LABS, IMAGING, TESTING) - I reviewed patient records, labs, notes, testing and imaging myself where available.  Laboratory evaluation July 2022: CMP showed sodium of 132, creatinine of 0.97, A1c of 5.5, B12 779, normal ESR C-reactive protein twice, on October 17, 2020 and January 27   MEDICAL HISTORY:  Sara Barnes is a 80 year old female, seen in request by Dr. Merrilee Jansky for evaluation of severe left facial pain, she is accompanied by her sister Vaughan Basta at today's visit on November 15, 2020, her primary care physician is Dr.Lowne Cheri Rous, Alferd Apa, DO   I reviewed and summarized the referring note. PMHX. Crohn disease, had multiple treatment in the past, currently taking low-dose of prednisone 5 mg daily, previously also had large intestinal abscess, required surgery and ileostomy in 2019, she still has frequent diarrhea, History of left breast cancer, status postsurgery in 2016 followed by radiation therapy  She  had many dental work done in the past, often felt little bit discomfort at the left cheek area, about a month after her left cheek area basal cell carcinoma surgery, she began to develop worsening left cheek area pain,  She described deep achy pain at left upper jaw since June 2022, sometimes the pain was so severe, she felt the radiating pain across her upper teeth but mainly on the left side, pounding, difficulty sleeping, radiating to left temporal region," 2 nights ago, I was in dire pain, I hope they took out my teeth"  Today she only has mild discomfort in the same territory, did take morning dose of Tylenol  She had long history of hearing loss, had a history of inner ear implant, not MRI candidate, still has hearing loss despite wearing hearing aid  UPDATE Sept 6 2022: She is taking gabapentin 164m 3 tabs every night, max 6 tablets, which has helped her left facial pain, she complains of mild side effect, dizziness, sleepiness,  We personally reviewed CT head without contrast in August 22, partially empty sella turcica, there was no acute intracranial abnormalities,  Laboratory evaluations in July 2022 showed normal thyroid function, A1c of 5.5, normal CMP, creatinine of 0.97, sodium of 132  PHYSICAL EXAM:   Vitals:   01/01/21 1338  BP: 122/78  Pulse: 81  Weight: 161 lb (73 kg)  Height: _0  (1.676 m)   Not recorded     Body mass index is 25.99 kg/m.  PHYSICAL EXAMNIATION:  Gen: NAD, conversant, well nourised, well groomed  NEUROLOGICAL EXAM:  MENTAL STATUS: Speech/cognition: Awake, alert, oriented to history taking and casual conversation   CRANIAL NERVES: CN II: Visual fields are full to confrontation. Pupils are round equal and briskly reactive to light. CN III, IV, VI: extraocular movement are normal. No ptosis. CN V: Facial sensation is intact to light touch CN VII: Face is symmetric with normal eye closure, cheek puff CN VIII: Hard of hearing CN  IX, X: Phonation is normal. CN XI: Head turning and shoulder shrug are intact  MOTOR: There is no pronator drift of out-stretched arms. Muscle bulk and tone are normal. Muscle strength is normal.  REFLEXES: Reflexes are 1 and symmetric at the biceps, triceps, knees, and absent at ankles. Plantar responses are flexor.  SENSORY: Decreased vibratory sensation at toes, length dependent decreased light touch, pinprick to above ankle level  COORDINATION: There is no trunk or limb dysmetria noted.  GAIT/STANCE: She can get up from seated position arm crossed, wide-based, cautious, difficulty standing up on tiptoe and heels, mildly unsteady,  REVIEW OF SYSTEMS:  Full 14 system review of systems performed and notable only for as above All other review of systems were negative.   ALLERGIES: Allergies  Allergen Reactions   Codeine Nausea And Vomiting   Mesalamine Nausea And Vomiting   Nsaids     Crohn's disease = NO NSAIDs   Oxycodone Itching and Rash   Norco [Hydrocodone-Acetaminophen] Nausea Only   Other Itching    PERFUMED LOTIONS  Cant have MRI due to ear implant    Sulfa Antibiotics Rash    HOME MEDICATIONS: Current Outpatient Medications  Medication Sig Dispense Refill   acetaminophen (TYLENOL) 500 MG tablet You can take 2 tablets every 6 hours as needed for pain.  Use this as your primary pain control.  Use Tramadol as a last resort.  You can buy this over the counter at any drug store.    DO NOT TAKE MORE THAN 4000 MG OF TYLENOL PER DAY.  IT CAN HARM YOUR LIVER. 30 tablet 0   cyanocobalamin (,VITAMIN B-12,) 1000 MCG/ML injection ADMINISTER 1 ML IN THE MUSCLE EVERY 30 DAYS 3 mL 11   diphenoxylate-atropine (LOMOTIL) 2.5-0.025 MG tablet Take 1-2 tablets by mouth 3-4 times daily AS NEEDED for severe diarrhea 240 tablet 6   famotidine (PEPCID) 20 MG tablet Take 1 tablet (20 mg total) by mouth 2 (two) times daily. 180 tablet 6   FLUoxetine (PROZAC) 20 MG capsule Take 1 capsule  (20 mg total) by mouth daily. 90 capsule 3   fluticasone (FLONASE) 50 MCG/ACT nasal spray Place 2 sprays into both nostrils daily. 16 g 6   gabapentin (NEURONTIN) 100 MG capsule Take 3 capsules (300 mg total) by mouth 3 (three) times daily. 270 capsule 6   loperamide (IMODIUM) 2 MG capsule Take 1 capsule (2 mg total) by mouth as needed for diarrhea or loose stools. 90 capsule 1   ondansetron (ZOFRAN-ODT) 4 MG disintegrating tablet DISSOLVE ONE TABLET BY MOUTH EVERY 6 HOURS AS NEEDED FOR NAUSEA AND VOMITING 90 tablet 6   predniSONE (DELTASONE) 5 MG tablet TAKE 1 TABLET(5 MG) BY MOUTH DAILY 90 tablet 1   zolpidem (AMBIEN) 10 MG tablet Take 1 tablet (10 mg total) by mouth at bedtime. 90 tablet 0   No current facility-administered medications for this visit.    PAST MEDICAL HISTORY: Past Medical History:  Diagnosis Date   Allergy    Anemia    Anxiety    Arthritis  Basal cell carcinoma    Blood transfusion without reported diagnosis    Bowel perforation (HCC)    Cancer of lower-inner quadrant of female breast ([place]) 11/30/2014   left   Cancer of lower-inner quadrant of left female breast ([place]) 08/29/9824   Complication of anesthesia    anxiety post-op after ear surgeries   Crohn's disease of large intestine with abscess [facility])    Dental crowns present    recent root canal 11/2014   Depression    Family history of adverse reaction to anesthesia    pt's sister has hx. of post-op N/V   GERD (gastroesophageal reflux disease)    Hyperlipidemia    Indigestion    Perianal abscess    Personal history of radiation therapy 2016   PONV (postoperative nausea and vomiting)    Runny nose 12/07/2014   clear drainage, per pt.   Seasonal allergies    TMJ (temporomandibular joint syndrome)    Ulcerative colitis    Vitamin B12 deficiency     PAST SURGICAL HISTORY: Past Surgical History:  Procedure Laterality Date   ABDOMINAL HYSTERECTOMY     partial   APPENDECTOMY     AUGMENTATION MAMMAPLASTY  Bilateral    BASAL CELL CARCINOMA EXCISION Left    nose   BREAST BIOPSY Left 2019   benign   BREAST ENHANCEMENT SURGERY     BREAST LUMPECTOMY Left 2016   BREAST LUMPECTOMY WITH RADIOACTIVE SEED AND SENTINEL LYMPH NODE BIOPSY Left 12/13/2014   Procedure: LEFT BREAST LUMPECTOMY WITH RADIOACTIVE SEED AND SENTINEL LYMPH NODE BIOPSY;  Surgeon: [name], MD;  Location: [place];  Service: General;  Laterality: Left;   COLONOSCOPY WITH PROPOFOL  05/28/2011; 06/28/2014   EXPLORATORY LAPAROTOMY  04/08/2018   with end ileostomy takedown and creation of loop ileostomy-- [facility] [place] N/A 01/08/2016   Procedure: Beryle Quant;  Surgeon: [name], MD;  Location: [facility];  Service: Gastroenterology;  Laterality: N/A;  needs MAC due to pain and anxiety   ILEOSTOMY N/A 05/25/2017   Procedure: DILATION OF ILEOSTOMY;  Surgeon: Rolm Bookbinder, MD;  Location: [facility];  Service: General;  Laterality: N/A;   ILEOSTOMY  02/11/2016   END ileostomy.  Dr Hassell Done   ILEOSTOMY CLOSURE N/A 06/02/2017   Procedure: RESECTION OF ILEAL STRICTURE; ILEOSTOMY REVISION ;  Surgeon: Michael Boston, MD;  Location: [facility];  Service: General;  Laterality: N/A;   ILEOSTOMY DILATION  05/25/2017   DILITATION OF ILEAL STRICTURE   ileostomy stricture resection  06/02/2017   with revision   INCISION AND DRAINAGE ABSCESS  2018   INCISION AND DRAINAGE PERIRECTAL ABSCESS N/A 09/28/2013   Procedure: IRRIGATION AND DEBRIDEMENT PERIANAL ABSCESS, proctoscopy;  Surgeon: Shann Medal, MD;  Location: [facility];  Service: General;  Laterality: N/A;   INCISION AND DRAINAGE PERIRECTAL ABSCESS N/A 01/22/2016   Procedure: IRRIGATION AND DEBRIDEMENT PERIRECTAL ABSCESS;  Surgeon: Armandina Gemma, MD;  Location: [facility];  Service: General;  Laterality: N/A;   INNER EAR SURGERY Bilateral    LAPAROSCOPIC DIVERTED COLOSTOMY N/A 02/11/2016   Procedure:  LAPAROSCOPIC DIVERTED ILEOSTOMY;  Surgeon: Johnathan Hausen, MD;  Location: [facility];  Service: General;  Laterality: N/A;   LAPAROSCOPY N/A 03/23/2017   Procedure: LAPAROSCOPY DIAGNOSTIC, EXPLORATORY LAPAROTOMY,  BOWEL RESECTION;  Surgeon: Jackolyn Confer, MD;  Location: [facility];  Service: General;  Laterality: N/A;    FAMILY HISTORY: Family History  Problem Relation Age of Onset   Prostate  cancer Brother 8   Kidney disease Brother    Diabetes Brother    Brain cancer Father    Rheum arthritis Mother    Anesthesia problems Sister        post-op N/V   Emphysema Maternal Grandfather    Asthma Sister    Rheum arthritis Brother    Esophageal cancer Neg Hx    Breast cancer Neg Hx    Rectal cancer Neg Hx    Stomach cancer Neg Hx    Colon cancer Neg Hx     SOCIAL HISTORY: Social History   Socioeconomic History   Marital status: Widowed    Spouse name: Not on file   Number of children: 1   Years of education: Not on file   Highest education level: Not on file  Occupational History   Occupation: Warden/ranger    Comment: retired at age 4  Tobacco Use   Smoking status: Never   Smokeless tobacco: Never  Vaping Use   Vaping Use: Never used  Substance and Sexual Activity   Alcohol use: Yes    Alcohol/week: 7.0 standard drinks    Types: 7 Glasses of wine per week   Drug use: No   Sexual activity: Not Currently    Partners: Male  Other Topics Concern   Not on file  Social History Narrative   Lives alone in own home   Right Handed    Drinks caffeine occasionally   Social Determinants of Health   Financial Resource Strain: Not on file  Food Insecurity: Not on file  Transportation Needs: Not on file  Physical Activity: Not on file  Stress: Not on file  Social Connections: Not on file  Intimate Partner Violence: Not on file      Marcial Pacas, M.D. Ph.D.  [facility] [address], [place], [place] [address] Ph: [phone] Fax:  [phone]  CC:  Ann Held, [place] [address] [place],  [place] [address]  Ann Held, DO

## 2021-01-03 ENCOUNTER — Other Ambulatory Visit: Payer: Self-pay

## 2021-01-03 ENCOUNTER — Other Ambulatory Visit (INDEPENDENT_AMBULATORY_CARE_PROVIDER_SITE_OTHER): Payer: Medicare HMO

## 2021-01-03 DIAGNOSIS — E871 Hypo-osmolality and hyponatremia: Secondary | ICD-10-CM

## 2021-01-03 LAB — COMPREHENSIVE METABOLIC PANEL
ALT: 10 U/L (ref 0–35)
AST: 15 U/L (ref 0–37)
Albumin: 3.7 g/dL (ref 3.5–5.2)
Alkaline Phosphatase: 84 U/L (ref 39–117)
BUN: 10 mg/dL (ref 6–23)
CO2: 26 mEq/L (ref 19–32)
Calcium: 8.8 mg/dL (ref 8.4–10.5)
Chloride: 99 mEq/L (ref 96–112)
Creatinine, Ser: 0.87 mg/dL (ref 0.40–1.20)
GFR: 63.47 mL/min (ref >60.00)
Glucose, Bld: 96 mg/dL (ref 70–99)
Potassium: 4.2 mEq/L (ref 3.5–5.1)
Sodium: 133 mEq/L — ABNORMAL LOW (ref 135–145)
Total Bilirubin: 0.5 mg/dL (ref 0.2–1.2)
Total Protein: 6.4 g/dL (ref 6.0–8.3)

## 2021-01-09 ENCOUNTER — Other Ambulatory Visit: Payer: Self-pay

## 2021-01-09 ENCOUNTER — Ambulatory Visit
Admission: RE | Admit: 2021-01-09 | Discharge: 2021-01-09 | Disposition: A | Discharge status: Home / Self Care | Payer: Medicare HMO | Source: Ambulatory Visit | Attending: Family Medicine | Admitting: Family Medicine

## 2021-01-09 DIAGNOSIS — Z1231 Encounter for screening mammogram for malignant neoplasm of breast: Secondary | ICD-10-CM | POA: Diagnosis not present

## 2021-01-16 ENCOUNTER — Ambulatory Visit: Payer: Medicare HMO | Admitting: Internal Medicine

## 2021-01-16 ENCOUNTER — Telehealth: Payer: Self-pay | Admitting: Internal Medicine

## 2021-01-16 ENCOUNTER — Encounter: Payer: Self-pay | Admitting: Internal Medicine

## 2021-01-16 VITALS — BP 130/80 | HR 85 | Ht 66.0 in | Wt 162.8 lb

## 2021-01-16 DIAGNOSIS — F5101 Primary insomnia: Secondary | ICD-10-CM

## 2021-01-16 DIAGNOSIS — K50119 Crohn's disease of large intestine with unspecified complications: Secondary | ICD-10-CM

## 2021-01-16 DIAGNOSIS — R1013 Epigastric pain: Secondary | ICD-10-CM | POA: Diagnosis not present

## 2021-01-16 DIAGNOSIS — E538 Deficiency of other specified B group vitamins: Secondary | ICD-10-CM

## 2021-01-16 DIAGNOSIS — F32A Depression, unspecified: Secondary | ICD-10-CM | POA: Diagnosis not present

## 2021-01-16 MED ORDER — CYANOCOBALAMIN 1000 MCG/ML IJ SOLN: INTRAMUSCULAR | 11 refills | Status: DC

## 2021-01-16 MED ORDER — FAMOTIDINE 20 MG PO TABS: 20.0000 mg | ORAL_TABLET | Freq: Two times a day (BID) | ORAL | 6 refills | Status: DC

## 2021-01-16 MED ORDER — DIPHENOXYLATE-ATROPINE 2.5-0.025 MG PO TABS: ORAL_TABLET | ORAL | 6 refills | Status: DC

## 2021-01-16 MED ORDER — ONDANSETRON 4 MG PO TBDP: ORAL_TABLET | ORAL | 6 refills | Status: DC

## 2021-01-16 MED ORDER — ZOLPIDEM TARTRATE 10 MG PO TABS: 10.0000 mg | ORAL_TABLET | Freq: Every day | ORAL | 0 refills | Status: DC

## 2021-01-16 MED ORDER — PREDNISONE 5 MG PO TABS: ORAL_TABLET | ORAL | 1 refills | Status: DC

## 2021-01-16 MED ORDER — FLUOXETINE HCL 20 MG PO CAPS
20.0000 mg | ORAL_CAPSULE | Freq: Every day | ORAL | 3 refills | Status: DC
Start: 2021-01-16 — End: 2021-07-02

## 2021-01-16 NOTE — Progress Notes (Signed)
   Subjective:    Patient ID: Sara Barnes, female    DOB: 04-12-1941, 80 y.o.   MRN: [id number]  HPI Claudie Brickhouse is a 80 year old female with a history of complicated Crohn's disease with perianal involvement, prior end ileostomy with subsequent takedown in June 2020, prior right hemicolectomy after cecal perforation in the setting of severe Crohn's disease, history of GERD with dyspepsia, insomnia and depression who is here for follow-up.  She is here with her Sister Vaughan Basta.  She reports that she is feeling well though she continues to deal intermittently with diarrhea.  She had weaned Lomotil down to once per day and weaned herself off entirely of loperamide.  In the last month or so she has had more frequent issues with loose stools and so she has been using Lomotil twice daily as well as intermittently loperamide.  She reports having try to liberalize her diet and this she feels exacerbated her loose stools and diarrhea.  When she eats raw vegetables and legumes her diarrhea is worse.  She has not seen blood in her stool or melena.  She is not having abdominal pain.  She is using famotidine 20 mg twice daily on an as-needed basis but not every day.  She continues Prozac and without Ambien does not sleep.  She also is taking 5 mg of prednisone  Primary care has been evaluating her hyponatremia.  Review of Systems As per HPI, otherwise negative  Current Medications, Allergies, Past Medical History, Past Surgical History, Family History and Social History were reviewed in [employer] record.    Objective:   Physical Exam BP 130/80   Pulse 85   Ht 5' 6"  (1.676 m)   Wt 162 lb 12.8 oz (73.8 kg)   SpO2 98%   BMI 26.28 kg/m  Gen: awake, alert, NAD HEENT: anicteric CV: RRR, no mrg Pulm: CTA b/l Abd: soft, NT/ND, +BS throughout Ext: no c/c/e Neuro: nonfocal     Assessment & Plan:  80 year old female with a history of complicated Crohn's disease with perianal  involvement, prior end ileostomy with subsequent takedown in June 2020, prior right hemicolectomy after cecal perforation in the setting of severe Crohn's disease, history of GERD with dyspepsia, insomnia and depression who is here for follow-up.  Crohn's colitis with perianal involvement --she has only been treated with low-dose prednisone as she does not wish to escalate therapy.  She feels that she is in a good place with her symptoms and management. --Continue prednisone 5 mg daily --Continue Lomotil 1 tablet up to 3 times daily as needed, Imodium can be used 2 mg up to 4 times daily as needed  2.  B12 deficiency --continue IM B12  3.  Insomnia --chronic Ambien use of 10 mg nightly.  This is longstanding and without this she does not sleep.  She is not falling or having confusion or any other negative side effects from this medication.  We will continue at current dose --Ambien 10 mg nightly as needed  4.  Nausea/dyspepsia --nausea significantly better of late.  She can continue famotidine 20 mg twice daily as needed for dyspepsia  5.  Depression --continue fluoxetine 20 mg daily  Return in about 6 months, patient can have refills of her medications until her next appointment

## 2021-01-16 NOTE — Patient Instructions (Signed)
We have sent the following medications to your pharmacy for you to pick up at your convenience: Lomotil, Famotidine, Prozac, Zofran, Prednisone, B12 injections, Ambien   If you are age 79 or older, your body mass index should be between 23-30. Your Body mass index is 26.28 kg/m. If this is out of the aforementioned range listed, please consider follow up with your Primary Care Provider.  If you are age 70 or younger, your body mass index should be between 19-25. Your Body mass index is 26.28 kg/m. If this is out of the aformentioned range listed, please consider follow up with your Primary Care Provider.   __________________________________________________________  The [facility] providers would like to encourage you to use [facility] to communicate with providers for non-urgent requests or questions.  Due to long hold times on the telephone, sending your provider a message by [facility] may be a faster and more efficient way to get a response.  Please allow 48 business hours for a response.  Please remember that this is for non-urgent requests.   Thank you for choosing me and [facility].  Dr.Pyrtle

## 2021-01-16 NOTE — Telephone Encounter (Signed)
Pharmacy advised that patient should take b12 once monthly IM.

## 2021-01-20 ENCOUNTER — Other Ambulatory Visit: Payer: Self-pay | Admitting: Physician Assistant

## 2021-03-26 ENCOUNTER — Ambulatory Visit (INDEPENDENT_AMBULATORY_CARE_PROVIDER_SITE_OTHER): Payer: Medicare HMO

## 2021-03-26 VITALS — Ht 66.0 in | Wt 162.0 lb

## 2021-03-26 DIAGNOSIS — Z0000 Encounter for general adult medical examination without abnormal findings: Secondary | ICD-10-CM | POA: Diagnosis not present

## 2021-03-26 NOTE — Patient Instructions (Signed)
Sara Barnes , Thank you for taking time to complete your Medicare Wellness Visit. I appreciate your ongoing commitment to your health goals. Please review the following plan we discussed and let me know if I can assist you in the future.   Screening recommendations/referrals: Colonoscopy: Followed by Dr. Hilarie Fredrickson Mammogram: Completed 01/09/2021-Due 01/09/2022 Bone Density: Declined Recommended yearly ophthalmology/optometry visit for glaucoma screening and checkup Recommended yearly dental visit for hygiene and checkup  Vaccinations: Influenza vaccine: Up to date Pneumococcal vaccine: Up to date Tdap vaccine: Discuss with pharmacy Shingles vaccine: Discuss with pharmacy   Covid-19:Booster available at the pharmacy  Advanced directives: Please bring a copy of Living Will and/or Healthcare Power of Attorney for your chart.   Conditions/risks identified: See problem list  Next appointment: Follow up in one year for your annual wellness visit    Preventive Care 65 Years and Older, Female Preventive care refers to lifestyle choices and visits with your health care provider that can promote health and wellness. What does preventive care include? A yearly physical exam. This is also called an annual well check. Dental exams once or twice a year. Routine eye exams. Ask your health care provider how often you should have your eyes checked. Personal lifestyle choices, including: Daily care of your teeth and gums. Regular physical activity. Eating a healthy diet. Avoiding tobacco and drug use. Limiting alcohol use. Practicing safe sex. Taking low-dose aspirin every day. Taking vitamin and mineral supplements as recommended by your health care provider. What happens during an annual well check? The services and screenings done by your health care provider during your annual well check will depend on your age, overall health, lifestyle risk factors, and family history of disease. Counseling   Your health care provider may ask you questions about your: Alcohol use. Tobacco use. Drug use. Emotional well-being. Home and relationship well-being. Sexual activity. Eating habits. History of falls. Memory and ability to understand (cognition). Work and work Statistician. Reproductive health. Screening  You may have the following tests or measurements: Height, weight, and BMI. Blood pressure. Lipid and cholesterol levels. These may be checked every 5 years, or more frequently if you are over 75 years old. Skin check. Lung cancer screening. You may have this screening every year starting at age 73 if you have a 30-pack-year history of smoking and currently smoke or have quit within the past 15 years. Fecal occult blood test (FOBT) of the stool. You may have this test every year starting at age 77. Flexible sigmoidoscopy or colonoscopy. You may have a sigmoidoscopy every 5 years or a colonoscopy every 10 years starting at age 55. Hepatitis C blood test. Hepatitis B blood test. Sexually transmitted disease (STD) testing. Diabetes screening. This is done by checking your blood sugar (glucose) after you have not eaten for a while (fasting). You may have this done every 1-3 years. Bone density scan. This is done to screen for osteoporosis. You may have this done starting at age 78. Mammogram. This may be done every 1-2 years. Talk to your health care provider about how often you should have regular mammograms. Talk with your health care provider about your test results, treatment options, and if necessary, the need for more tests. Vaccines  Your health care provider may recommend certain vaccines, such as: Influenza vaccine. This is recommended every year. Tetanus, diphtheria, and acellular pertussis (Tdap, Td) vaccine. You may need a Td booster every 10 years. Zoster vaccine. You may need this after age 14. Pneumococcal 13-valent  conjugate (PCV13) vaccine. One dose is recommended  after age 76. Pneumococcal polysaccharide (PPSV23) vaccine. One dose is recommended after age 66. Talk to your health care provider about which screenings and vaccines you need and how often you need them. This information is not intended to replace advice given to you by your health care provider. Make sure you discuss any questions you have with your health care provider. Document Released: 05/11/2015 Document Revised: 01/02/2016 Document Reviewed: 02/13/2015 Elsevier Interactive Patient Education  2017 [place] Prevention in the Home Falls can cause injuries. They can happen to people of all ages. There are many things you can do to make your home safe and to help prevent falls. What can I do on the outside of my home? Regularly fix the edges of walkways and driveways and fix any cracks. Remove anything that might make you trip as you walk through a door, such as a raised step or threshold. Trim any bushes or trees on the path to your home. Use bright outdoor lighting. Clear any walking paths of anything that might make someone trip, such as rocks or tools. Regularly check to see if handrails are loose or broken. Make sure that both sides of any steps have handrails. Any raised decks and porches should have guardrails on the edges. Have any leaves, snow, or ice cleared regularly. Use sand or salt on walking paths during winter. Clean up any spills in your garage right away. This includes oil or grease spills. What can I do in the bathroom? Use night lights. Install grab bars by the toilet and in the tub and shower. Do not use towel bars as grab bars. Use non-skid mats or decals in the tub or shower. If you need to sit down in the shower, use a plastic, non-slip stool. Keep the floor dry. Clean up any water that spills on the floor as soon as it happens. Remove soap buildup in the tub or shower regularly. Attach bath mats securely with double-sided non-slip rug tape. Do not  have throw rugs and other things on the floor that can make you trip. What can I do in the bedroom? Use night lights. Make sure that you have a light by your bed that is easy to reach. Do not use any sheets or blankets that are too big for your bed. They should not hang down onto the floor. Have a firm chair that has side arms. You can use this for support while you get dressed. Do not have throw rugs and other things on the floor that can make you trip. What can I do in the kitchen? Clean up any spills right away. Avoid walking on wet floors. Keep items that you use a lot in easy-to-reach places. If you need to reach something above you, use a strong step stool that has a grab bar. Keep electrical cords out of the way. Do not use floor polish or wax that makes floors slippery. If you must use wax, use non-skid floor wax. Do not have throw rugs and other things on the floor that can make you trip. What can I do with my stairs? Do not leave any items on the stairs. Make sure that there are handrails on both sides of the stairs and use them. Fix handrails that are broken or loose. Make sure that handrails are as long as the stairways. Check any carpeting to make sure that it is firmly attached to the stairs. Fix any carpet that  is loose or worn. Avoid having throw rugs at the top or bottom of the stairs. If you do have throw rugs, attach them to the floor with carpet tape. Make sure that you have a light switch at the top of the stairs and the bottom of the stairs. If you do not have them, ask someone to add them for you. What else can I do to help prevent falls? Wear shoes that: Do not have high heels. Have rubber bottoms. Are comfortable and fit you well. Are closed at the toe. Do not wear sandals. If you use a stepladder: Make sure that it is fully opened. Do not climb a closed stepladder. Make sure that both sides of the stepladder are locked into place. Ask someone to hold it for  you, if possible. Clearly mark and make sure that you can see: Any grab bars or handrails. First and last steps. Where the edge of each step is. Use tools that help you move around (mobility aids) if they are needed. These include: Canes. Walkers. Scooters. Crutches. Turn on the lights when you go into a dark area. Replace any light bulbs as soon as they burn out. Set up your furniture so you have a clear path. Avoid moving your furniture around. If any of your floors are uneven, fix them. If there are any pets around you, be aware of where they are. Review your medicines with your doctor. Some medicines can make you feel dizzy. This can increase your chance of falling. Ask your doctor what other things that you can do to help prevent falls. This information is not intended to replace advice given to you by your health care provider. Make sure you discuss any questions you have with your health care provider. Document Released: 02/08/2009 Document Revised: 09/20/2015 Document Reviewed: 05/19/2014 Elsevier Interactive Patient Education  2017 [employer].

## 2021-03-26 NOTE — Progress Notes (Signed)
Subjective:   Sara Barnes is a 80 y.o. female who presents for an Initial Medicare Annual Wellness Visit.   I connected with [name] today by telephone and verified that I am speaking with the correct person using two identifiers. Location patient: home Location provider: work Persons participating in the virtual visit: patient, Marine scientist.    I discussed the limitations, risks, security and privacy concerns of performing an evaluation and management service by telephone and the availability of in person appointments. I also discussed with the patient that there may be a patient responsible charge related to this service. The patient expressed understanding and verbally consented to this telephonic visit.    Interactive audio and video telecommunications were attempted between this provider and patient, however failed, due to patient having technical difficulties OR patient did not have access to video capability.  We continued and completed visit with audio only.  Some vital signs may be absent or patient reported.   Time Spent with patient on telephone encounter: 40 minutes  Review of Systems     Cardiac Risk Factors include: advanced age (>63mn, >>5women)     Objective:    Today's Vitals   03/26/21 1340  Weight: 162 lb (73.5 kg)  Height: _0  (1.676 m)   Body mass index is 26.15 kg/m.  Advanced Directives 03/26/2021 10/29/2020 08/08/2020 05/21/2017 03/23/2017 03/23/2017 06/17/2016  Does Patient Have a Medical Advance Directive? Yes No Yes No Yes No;Yes Yes  Type of AParamedicof ACherry GroveLiving will - [place]redaleLiving will - [place]Country AcresLiving will Living will [place] Does patient want to make changes to medical advance directive? - - - - No - Patient declined - No - Patient declined  Copy of [place]in Chart? No - copy requested - - - No - copy requested - No - copy requested   Would patient like information on creating a medical advance directive? - No - Patient declined - No - Patient declined - - No - Patient declined  Pre-existing out of facility DNR order (yellow form or pink MOST form) - - - - - - -    Current Medications (verified) Outpatient Encounter Medications as of 03/26/2021  Medication Sig   acetaminophen (TYLENOL) 500 MG tablet You can take 2 tablets every 6 hours as needed for pain.  Use this as your primary pain control.  Use Tramadol as a last resort.  You can buy this over the counter at any drug store.    DO NOT TAKE MORE THAN 4000 MG OF TYLENOL PER DAY.  IT CAN HARM YOUR LIVER.   cyanocobalamin (,VITAMIN B-12,) 1000 MCG/ML injection As directed   diphenoxylate-atropine (LOMOTIL) 2.5-0.025 MG tablet Take 1-2 tablets by mouth 3-4 times daily AS NEEDED for severe diarrhea   famotidine (PEPCID) 20 MG tablet Take 1 tablet (20 mg total) by mouth 2 (two) times daily.   FLUoxetine (PROZAC) 20 MG capsule Take 1 capsule (20 mg total) by mouth daily.   fluticasone (FLONASE) 50 MCG/ACT nasal spray Place 2 sprays into both nostrils daily.   gabapentin (NEURONTIN) 100 MG capsule Take 3 capsules (300 mg total) by mouth 3 (three) times daily.   loperamide (IMODIUM) 2 MG capsule Take 1 capsule (2 mg total) by mouth as needed for diarrhea or loose stools.   ondansetron (ZOFRAN-ODT) 4 MG disintegrating tablet DISSOLVE ONE TABLET BY MOUTH EVERY 6 HOURS AS NEEDED FOR NAUSEA AND  VOMITING   predniSONE (DELTASONE) 5 MG tablet TAKE 1 TABLET(5 MG) BY MOUTH DAILY   zolpidem (AMBIEN) 10 MG tablet Take 1 tablet (10 mg total) by mouth at bedtime.   No facility-administered encounter medications on file as of 03/26/2021.    Allergies (verified) Codeine, Mesalamine, Nsaids, Oxycodone, Norco [hydrocodone-acetaminophen], Other, and Sulfa antibiotics   History: Past Medical History:  Diagnosis Date   Allergy    Anemia    Anxiety    Arthritis    Basal cell carcinoma     Blood transfusion without reported diagnosis    Bowel perforation ([place])    Cancer of lower-inner quadrant of female breast ([place]) 11/30/2014   left   Cancer of lower-inner quadrant of left female breast ([place]) 08/28/8411   Complication of anesthesia    anxiety post-op after ear surgeries   Crohn's disease of large intestine with abscess ([place])    Dental crowns present    recent root canal 11/2014   Depression    Family history of adverse reaction to anesthesia    pt's sister has hx. of post-op N/V   GERD (gastroesophageal reflux disease)    Hyperlipidemia    Indigestion    Perianal abscess    Personal history of radiation therapy 2016   PONV (postoperative nausea and vomiting)    Runny nose 12/07/2014   clear drainage, per pt.   Seasonal allergies    TMJ (temporomandibular joint syndrome)    Ulcerative colitis    Vitamin B12 deficiency    Past Surgical History:  Procedure Laterality Date   ABDOMINAL HYSTERECTOMY     partial   APPENDECTOMY     AUGMENTATION MAMMAPLASTY Bilateral    BASAL CELL CARCINOMA EXCISION Left    nose   BREAST BIOPSY Left 2019   benign   BREAST ENHANCEMENT SURGERY     BREAST LUMPECTOMY Left 2016   BREAST LUMPECTOMY WITH RADIOACTIVE SEED AND SENTINEL LYMPH NODE BIOPSY Left 12/13/2014   Procedure: LEFT BREAST LUMPECTOMY WITH RADIOACTIVE SEED AND SENTINEL LYMPH NODE BIOPSY;  Surgeon: [name], MD;  Location: [place];  Service: General;  Laterality: Left;   COLONOSCOPY WITH PROPOFOL  05/28/2011; 06/28/2014   EXPLORATORY LAPAROTOMY  04/08/2018   with end ileostomy takedown and creation of loop ileostomy-- [facility] [place] N/A 01/08/2016   Procedure: Beryle Quant;  Surgeon: [name], MD;  Location[facility];  Service: Gastroenterology;  Laterality: N/A;  needs MAC due to pain and anxiety   ILEOSTOMY N/A 05/25/2017   Procedure: DILATION OF ILEOSTOMY;  Surgeon:  Rolm Bookbinder, MD;  Location: [facility];  Service: General;  Laterality: N/A;   ILEOSTOMY  02/11/2016   END ileostomy.  Dr Hassell Done   ILEOSTOMY CLOSURE N/A 06/02/2017   Procedure: RESECTION OF ILEAL STRICTURE; ILEOSTOMY REVISION ;  Surgeon: Michael Boston, MD;  Location: [facility];  Service: General;  Laterality: N/A;   ILEOSTOMY DILATION  05/25/2017   DILITATION OF ILEAL STRICTURE   ileostomy stricture resection  06/02/2017   with revision   INCISION AND DRAINAGE ABSCESS  2018   INCISION AND DRAINAGE PERIRECTAL ABSCESS N/A 09/28/2013   Procedure: IRRIGATION AND DEBRIDEMENT PERIANAL ABSCESS, proctoscopy;  Surgeon: Shann Medal, MD;  Location: [facility];  Service: General;  Laterality: N/A;   INCISION AND DRAINAGE PERIRECTAL ABSCESS N/A 01/22/2016   Procedure: IRRIGATION AND DEBRIDEMENT PERIRECTAL ABSCESS;  Surgeon: Armandina Gemma, MD;  Location: [facility];  Service: General;  Laterality: N/A;  INNER EAR SURGERY Bilateral    LAPAROSCOPIC DIVERTED COLOSTOMY N/A 02/11/2016   Procedure: LAPAROSCOPIC DIVERTED ILEOSTOMY;  Surgeon: Johnathan Hausen, MD;  Location: [facility];  Service: General;  Laterality: N/A;   LAPAROSCOPY N/A 03/23/2017   Procedure: LAPAROSCOPY DIAGNOSTIC, EXPLORATORY LAPAROTOMY,  BOWEL RESECTION;  Surgeon: Jackolyn Confer, MD;  Location: [facility];  Service: General;  Laterality: N/A;   Family History  Problem Relation Age of Onset   Prostate cancer Brother 45   Kidney disease Brother    Diabetes Brother    Brain cancer Father    Rheum arthritis Mother    Anesthesia problems Sister        post-op N/V   Emphysema Maternal Grandfather    Asthma Sister    Rheum arthritis Brother    Esophageal cancer Neg Hx    Breast cancer Neg Hx    Rectal cancer Neg Hx    Stomach cancer Neg Hx    Colon cancer Neg Hx    Social History   Socioeconomic History   Marital status: Widowed    Spouse name: Not on file   Number of children: 1   Years of education: Not on file   Highest education level: Not  on file  Occupational History   Occupation: Warden/ranger    Comment: retired at age 32  Tobacco Use   Smoking status: Never   Smokeless tobacco: Never  Vaping Use   Vaping Use: Never used  Substance and Sexual Activity   Alcohol use: Yes    Alcohol/week: 7.0 standard drinks    Types: 7 Glasses of wine per week    Comment: wine daily   Drug use: No   Sexual activity: Not Currently    Partners: Male  Other Topics Concern   Not on file  Social History Narrative   Lives alone in own home   Right Handed    Drinks caffeine occasionally   Social Determinants of Health   Financial Resource Strain: Low Risk    Difficulty of Paying Living Expenses: Not hard at all  Food Insecurity: No Food Insecurity   Worried About Charity fundraiser in the Last Year: Never true   [place] in the Last Year: Never true  Transportation Needs: No Transportation Needs   Lack of Transportation (Medical): No   Lack of Transportation (Non-Medical): No  Physical Activity: Sufficiently Active   Days of Exercise per Week: 7 days   Minutes of Exercise per Session: 30 min  Stress: No Stress Concern Present   Feeling of Stress : Not at all  Social Connections: Moderately Isolated   Frequency of Communication with Friends and Family: More than three times a week   Frequency of Social Gatherings with Friends and Family: Once a week   Attends Religious Services: More than 4 times per year   Active Member of [employer] or Organizations: No   Attends Archivist Meetings: Never   Marital Status: Widowed    Tobacco Counseling Counseling given: Not Answered   Clinical Intake:  Pre-visit preparation completed: Yes  Pain : No/denies pain     BMI - recorded: 26.15 Nutritional Status: BMI 25 -29 Overweight Nutritional Risks: None Diabetes: No  How often do you need to have someone help you when you read instructions, pamphlets, or other written materials from your doctor or pharmacy?: 1  - Never  Diabetic?No  Interpreter Needed?: No  Information entered by :: Caroleen Hamman LPN   Activities of Daily  Living In your present state of health, do you have any difficulty performing the following activities: 03/26/2021  Hearing? N  Vision? N  Difficulty concentrating or making decisions? N  Walking or climbing stairs? N  Dressing or bathing? N  Doing errands, shopping? N  Preparing Food and eating ? N  Using the Toilet? N  In the past six months, have you accidently leaked urine? Y  Do you have problems with loss of bowel control? Y  Comment Crohns disease  Managing your Medications? N  Managing your Finances? N  Housekeeping or managing your Housekeeping? N  Some recent data might be hidden    Patient Care Team: Carollee Herter, Alferd Apa, DO as PCP - General [name], MD as Consulting Physician (Breast Surgery) Pyrtle, Lajuan Lines, MD as Consulting Physician (Gastroenterology) Druscilla Brownie, MD as Referring Physician (Dermatology) Cindra Presume, MD as Consulting Physician (Plastic Surgery) Truitt Merle, MD as Consulting Physician (Hematology)  Indicate any recent Medical Services you may have received from other than [facility] providers in the past year (date may be approximate).     Assessment:   This is a routine wellness examination for Sara Barnes.  Hearing/Vision screen Hearing Screening - Comments:: Bilateral hearing aids Vision Screening - Comments:: Last eye exam-2020  Dietary issues and exercise activities discussed: Current Exercise Habits: Home exercise routine, Type of exercise: walking, Time (Minutes): 30, Frequency (Times/Week): 7, Weekly Exercise (Minutes/Week): 210, Intensity: Mild, Exercise limited by: None identified   Goals Addressed             This Visit's Progress    Patient Stated       Would like to be outside more       Depression Screen PHQ 2/9 Scores 03/26/2021 03/26/2020 01/30/2015 01/30/2015 01/26/2014 01/03/2013  PHQ - 2 Score 0 0  0 0 0 0    Fall Risk Fall Risk  03/26/2021 06/18/2020 01/23/2020 01/30/2015 01/30/2015  Falls in the past year? 0 0 0 No No  Number falls in past yr: 0 0 0 - -  Injury with Fall? 0 0 0 - -  Follow up Falls prevention discussed - Falls evaluation completed - -    FALL RISK PREVENTION PERTAINING TO THE HOME:  Any stairs in or around the home? Yes  If so, are there any without handrails? No  Home free of loose throw rugs in walkways, pet beds, electrical cords, etc? Yes  Adequate lighting in your home to reduce risk of falls? Yes   ASSISTIVE DEVICES UTILIZED TO PREVENT FALLS:  Life alert? No  Use of a cane, walker or w/c? No  Grab bars in the bathroom? Yes  Shower chair or bench in shower? Yes  Elevated toilet seat or a handicapped toilet? No   TIMED UP AND GO:  Was the test performed? No . Phone visit   Cognitive Function:        Immunizations Immunization History  Administered Date(s) Administered   Hepatitis B, ped/adol 09/20/2015, 10/22/2015, 04/08/2016   Influenza, High Dose Seasonal PF 01/30/2015, 01/23/2020   Influenza,inj,Quad PF,6+ Mos 01/26/2014, 02/04/2017, 03/11/2018, 03/29/2019   Influenza-Unspecified 01/26/2021   PFIZER(Purple Top)SARS-COV-2 Vaccination 08/15/2019, 09/12/2019   PPD Test 02/29/2016   Pneumococcal Conjugate-13 01/26/2014   Pneumococcal Polysaccharide-23 12/10/2007   Td 06/04/2004, 08/12/2006   Zoster, Live 03/01/2012    TDAP status: Due, Education has been provided regarding the importance of this vaccine. Advised may receive this vaccine at local pharmacy or Health Dept. Aware to provide a copy  of the vaccination record if obtained from local pharmacy or Health Dept. Verbalized acceptance and understanding.  Flu Vaccine status: Up to date  Pneumococcal vaccine status: Up to date  Covid-19 vaccine status: Information provided on how to obtain vaccines.   Qualifies for Shingles Vaccine? Yes   Zostavax completed Yes   Shingrix  Completed?: No.    Education has been provided regarding the importance of this vaccine. Patient has been advised to call insurance company to determine out of pocket expense if they have not yet received this vaccine. Advised may also receive vaccine at local pharmacy or Health Dept. Verbalized acceptance and understanding.  Screening Tests Health Maintenance  Topic Date Due   Zoster Vaccines- Shingrix (1 of 2) Never done   TETANUS/TDAP  08/11/2016   COVID-19 Vaccine (3 - Pfizer risk series) 10/10/2019   MAMMOGRAM  01/09/2022   Pneumonia Vaccine 20+ Years old  Completed   INFLUENZA VACCINE  Completed   DEXA SCAN  Completed   Hepatitis C Screening  Completed   HPV VACCINES  Aged Out    Health Maintenance  Health Maintenance Due  Topic Date Due   Zoster Vaccines- Shingrix (1 of 2) Never done   TETANUS/TDAP  08/11/2016   COVID-19 Vaccine (3 - Pfizer risk series) 10/10/2019    Colorectal cancer screening: No longer required.   Mammogram status: Completed bilateral 01/09/2021. Repeat every year  Bone Density status: Declined  Lung Cancer Screening: (Low Dose CT Chest recommended if Age 70-80 years, 30 pack-year currently smoking OR have quit w/in 15years.) does not qualify.    Additional Screening:  Hepatitis C Screening: Completed 09/03/2016  Vision Screening: Recommended annual ophthalmology exams for early detection of glaucoma and other disorders of the eye. Is the patient up to date with their annual eye exam?  No  Who is the provider or what is the name of the office in which the patient attends annual eye exams? Patient unsure of name-Advised to make an appt   Dental Screening: Recommended annual dental exams for proper oral hygiene  Community Resource Referral / Chronic Care Management: CRR required this visit?  No   CCM required this visit?  No      Plan:     I have personally reviewed and noted the following in the patient's chart:   Medical and social  history Use of alcohol, tobacco or illicit drugs  Current medications and supplements including opioid prescriptions. Patient is not currently taking opioid prescriptions. Functional ability and status Nutritional status Physical activity Advanced directives List of other physicians Hospitalizations, surgeries, and ER visits in previous 12 months Vitals Screenings to include cognitive, depression, and falls Referrals and appointments  In addition, I have reviewed and discussed with patient certain preventive protocols, quality metrics, and best practice recommendations. A written personalized care plan for preventive services as well as general preventive health recommendations were provided to patient.   Due to this being a telephonic visit, the after visit summary with patients personalized plan was offered to patient via mail or my-chart. Patient would like to access on my-chart/ per request, patient was mailed a copy of AVS./ Patient preferred to pick up at office at next visit.    Marta Antu, LPN   [phone]  Nurse Health Advisor  Nurse Notes: None

## 2021-04-13 ENCOUNTER — Other Ambulatory Visit: Payer: Self-pay | Admitting: Internal Medicine

## 2021-04-15 ENCOUNTER — Telehealth: Payer: Self-pay | Admitting: Internal Medicine

## 2021-04-15 MED ORDER — ZOLPIDEM TARTRATE 10 MG PO TABS: 10.0000 mg | ORAL_TABLET | Freq: Every evening | ORAL | 0 refills | Status: DC | PRN

## 2021-04-15 NOTE — Telephone Encounter (Signed)
I have spoken to patient who indicates that 5 mg is not effective for her. She would like 10 mg rx. Rx created and signed by Dr Hilarie Fredrickson and faxed to [place].

## 2021-04-15 NOTE — Telephone Encounter (Signed)
Patient called stating she needed a refill of her Zolpidem.  Her last dose will be today and she needs it to help her sleep.  Please call patient if there is an issue with this.  She uses the [facility] on [address].  Thank you.

## 2021-04-15 NOTE — Telephone Encounter (Signed)
Please ask pt if 5 mg is sufficient.  I recommend the lowest effective dose. If 5 mg ineffective, okay to renew at 10 mg qHS PRN sleep Long-standing insomnia without side-effects from this medication Thanks JMP

## 2021-04-15 NOTE — Telephone Encounter (Signed)
Dr Hilarie Fredrickson, patient is requesting refill on ambien 10 mg. It looks like she got a refill at last office visit of the 10 mg, however, prior to that, she was reduced to 5 mg tablet. See 07/16/20 telephone note. Do you want me to refill 10 mg tabs or 5 mg tabs? She is taking every night.

## 2021-04-19 ENCOUNTER — Telehealth: Payer: Self-pay | Admitting: Internal Medicine

## 2021-04-19 ENCOUNTER — Emergency Department (HOSPITAL_COMMUNITY): Payer: Medicare HMO

## 2021-04-19 ENCOUNTER — Encounter (HOSPITAL_COMMUNITY): Payer: Self-pay

## 2021-04-19 ENCOUNTER — Inpatient Hospital Stay (HOSPITAL_COMMUNITY)
Admission: EM | Admit: 2021-04-19 | Discharge: 2021-05-16 | DRG: 435 | Disposition: A | Discharge status: Home Health | Payer: Medicare HMO | Attending: Internal Medicine | Admitting: Internal Medicine

## 2021-04-19 ENCOUNTER — Other Ambulatory Visit: Payer: Self-pay

## 2021-04-19 DIAGNOSIS — Z20822 Contact with and (suspected) exposure to covid-19: Secondary | ICD-10-CM | POA: Diagnosis present

## 2021-04-19 DIAGNOSIS — E663 Overweight: Secondary | ICD-10-CM | POA: Diagnosis present

## 2021-04-19 DIAGNOSIS — C801 Malignant (primary) neoplasm, unspecified: Secondary | ICD-10-CM | POA: Diagnosis not present

## 2021-04-19 DIAGNOSIS — R112 Nausea with vomiting, unspecified: Secondary | ICD-10-CM

## 2021-04-19 DIAGNOSIS — K661 Hemoperitoneum: Secondary | ICD-10-CM | POA: Diagnosis not present

## 2021-04-19 DIAGNOSIS — K219 Gastro-esophageal reflux disease without esophagitis: Secondary | ICD-10-CM | POA: Diagnosis present

## 2021-04-19 DIAGNOSIS — E871 Hypo-osmolality and hyponatremia: Secondary | ICD-10-CM | POA: Diagnosis present

## 2021-04-19 DIAGNOSIS — R160 Hepatomegaly, not elsewhere classified: Secondary | ICD-10-CM

## 2021-04-19 DIAGNOSIS — R4182 Altered mental status, unspecified: Secondary | ICD-10-CM | POA: Diagnosis not present

## 2021-04-19 DIAGNOSIS — R21 Rash and other nonspecific skin eruption: Secondary | ICD-10-CM | POA: Diagnosis present

## 2021-04-19 DIAGNOSIS — R1084 Generalized abdominal pain: Secondary | ICD-10-CM | POA: Diagnosis not present

## 2021-04-19 DIAGNOSIS — K50819 Crohn's disease of both small and large intestine with unspecified complications: Secondary | ICD-10-CM | POA: Diagnosis not present

## 2021-04-19 DIAGNOSIS — C50919 Malignant neoplasm of unspecified site of unspecified female breast: Secondary | ICD-10-CM | POA: Diagnosis not present

## 2021-04-19 DIAGNOSIS — A09 Infectious gastroenteritis and colitis, unspecified: Secondary | ICD-10-CM | POA: Diagnosis not present

## 2021-04-19 DIAGNOSIS — G4700 Insomnia, unspecified: Secondary | ICD-10-CM | POA: Diagnosis present

## 2021-04-19 DIAGNOSIS — E8809 Other disorders of plasma-protein metabolism, not elsewhere classified: Secondary | ICD-10-CM | POA: Diagnosis present

## 2021-04-19 DIAGNOSIS — N179 Acute kidney failure, unspecified: Secondary | ICD-10-CM | POA: Diagnosis not present

## 2021-04-19 DIAGNOSIS — Z9882 Breast implant status: Secondary | ICD-10-CM

## 2021-04-19 DIAGNOSIS — R110 Nausea: Secondary | ICD-10-CM | POA: Diagnosis not present

## 2021-04-19 DIAGNOSIS — R519 Headache, unspecified: Secondary | ICD-10-CM | POA: Diagnosis not present

## 2021-04-19 DIAGNOSIS — Y848 Other medical procedures as the cause of abnormal reaction of the patient, or of later complication, without mention of misadventure at the time of the procedure: Secondary | ICD-10-CM | POA: Diagnosis not present

## 2021-04-19 DIAGNOSIS — R197 Diarrhea, unspecified: Secondary | ICD-10-CM | POA: Diagnosis not present

## 2021-04-19 DIAGNOSIS — R1111 Vomiting without nausea: Secondary | ICD-10-CM | POA: Diagnosis not present

## 2021-04-19 DIAGNOSIS — Z923 Personal history of irradiation: Secondary | ICD-10-CM

## 2021-04-19 DIAGNOSIS — A498 Other bacterial infections of unspecified site: Secondary | ICD-10-CM | POA: Diagnosis not present

## 2021-04-19 DIAGNOSIS — R944 Abnormal results of kidney function studies: Secondary | ICD-10-CM | POA: Diagnosis not present

## 2021-04-19 DIAGNOSIS — Z888 Allergy status to other drugs, medicaments and biological substances status: Secondary | ICD-10-CM

## 2021-04-19 DIAGNOSIS — R079 Chest pain, unspecified: Secondary | ICD-10-CM | POA: Diagnosis not present

## 2021-04-19 DIAGNOSIS — E876 Hypokalemia: Secondary | ICD-10-CM | POA: Diagnosis not present

## 2021-04-19 DIAGNOSIS — C50912 Malignant neoplasm of unspecified site of left female breast: Secondary | ICD-10-CM | POA: Diagnosis not present

## 2021-04-19 DIAGNOSIS — R1110 Vomiting, unspecified: Secondary | ICD-10-CM | POA: Diagnosis not present

## 2021-04-19 DIAGNOSIS — I480 Paroxysmal atrial fibrillation: Secondary | ICD-10-CM | POA: Diagnosis not present

## 2021-04-19 DIAGNOSIS — F32A Depression, unspecified: Secondary | ICD-10-CM | POA: Diagnosis present

## 2021-04-19 DIAGNOSIS — F5101 Primary insomnia: Secondary | ICD-10-CM | POA: Diagnosis present

## 2021-04-19 DIAGNOSIS — K5090 Crohn's disease, unspecified, without complications: Secondary | ICD-10-CM | POA: Diagnosis present

## 2021-04-19 DIAGNOSIS — Z886 Allergy status to analgesic agent status: Secondary | ICD-10-CM

## 2021-04-19 DIAGNOSIS — J910 Malignant pleural effusion: Secondary | ICD-10-CM | POA: Diagnosis not present

## 2021-04-19 DIAGNOSIS — Z853 Personal history of malignant neoplasm of breast: Secondary | ICD-10-CM

## 2021-04-19 DIAGNOSIS — Z882 Allergy status to sulfonamides status: Secondary | ICD-10-CM

## 2021-04-19 DIAGNOSIS — K91840 Postprocedural hemorrhage and hematoma of a digestive system organ or structure following a digestive system procedure: Secondary | ICD-10-CM | POA: Diagnosis not present

## 2021-04-19 DIAGNOSIS — A0472 Enterocolitis due to Clostridium difficile, not specified as recurrent: Secondary | ICD-10-CM | POA: Diagnosis not present

## 2021-04-19 DIAGNOSIS — Z885 Allergy status to narcotic agent status: Secondary | ICD-10-CM

## 2021-04-19 DIAGNOSIS — R300 Dysuria: Secondary | ICD-10-CM | POA: Diagnosis not present

## 2021-04-19 DIAGNOSIS — Z79899 Other long term (current) drug therapy: Secondary | ICD-10-CM

## 2021-04-19 DIAGNOSIS — C7951 Secondary malignant neoplasm of bone: Secondary | ICD-10-CM | POA: Diagnosis not present

## 2021-04-19 DIAGNOSIS — D62 Acute posthemorrhagic anemia: Secondary | ICD-10-CM | POA: Diagnosis not present

## 2021-04-19 DIAGNOSIS — Z85828 Personal history of other malignant neoplasm of skin: Secondary | ICD-10-CM

## 2021-04-19 DIAGNOSIS — Z90710 Acquired absence of both cervix and uterus: Secondary | ICD-10-CM

## 2021-04-19 DIAGNOSIS — R531 Weakness: Secondary | ICD-10-CM

## 2021-04-19 DIAGNOSIS — Z170 Estrogen receptor positive status [ER+]: Secondary | ICD-10-CM

## 2021-04-19 DIAGNOSIS — M549 Dorsalgia, unspecified: Secondary | ICD-10-CM | POA: Diagnosis not present

## 2021-04-19 DIAGNOSIS — Y92238 Other place in hospital as the place of occurrence of the external cause: Secondary | ICD-10-CM | POA: Diagnosis not present

## 2021-04-19 DIAGNOSIS — K7689 Other specified diseases of liver: Secondary | ICD-10-CM | POA: Diagnosis not present

## 2021-04-19 DIAGNOSIS — E440 Moderate protein-calorie malnutrition: Secondary | ICD-10-CM | POA: Insufficient documentation

## 2021-04-19 DIAGNOSIS — K7290 Hepatic failure, unspecified without coma: Secondary | ICD-10-CM | POA: Diagnosis not present

## 2021-04-19 DIAGNOSIS — Z7952 Long term (current) use of systemic steroids: Secondary | ICD-10-CM

## 2021-04-19 DIAGNOSIS — J90 Pleural effusion, not elsewhere classified: Secondary | ICD-10-CM | POA: Diagnosis not present

## 2021-04-19 DIAGNOSIS — R109 Unspecified abdominal pain: Secondary | ICD-10-CM | POA: Diagnosis not present

## 2021-04-19 DIAGNOSIS — J811 Chronic pulmonary edema: Secondary | ICD-10-CM | POA: Diagnosis not present

## 2021-04-19 DIAGNOSIS — Z9889 Other specified postprocedural states: Secondary | ICD-10-CM

## 2021-04-19 DIAGNOSIS — R0602 Shortness of breath: Secondary | ICD-10-CM

## 2021-04-19 DIAGNOSIS — J9811 Atelectasis: Secondary | ICD-10-CM | POA: Diagnosis not present

## 2021-04-19 DIAGNOSIS — K50919 Crohn's disease, unspecified, with unspecified complications: Secondary | ICD-10-CM

## 2021-04-19 DIAGNOSIS — C787 Secondary malignant neoplasm of liver and intrahepatic bile duct: Principal | ICD-10-CM | POA: Diagnosis present

## 2021-04-19 DIAGNOSIS — K91870 Postprocedural hematoma of a digestive system organ or structure following a digestive system procedure: Secondary | ICD-10-CM | POA: Diagnosis not present

## 2021-04-19 DIAGNOSIS — E8770 Fluid overload, unspecified: Secondary | ICD-10-CM | POA: Diagnosis not present

## 2021-04-19 DIAGNOSIS — E785 Hyperlipidemia, unspecified: Secondary | ICD-10-CM | POA: Diagnosis present

## 2021-04-19 DIAGNOSIS — K5010 Crohn's disease of large intestine without complications: Secondary | ICD-10-CM | POA: Diagnosis not present

## 2021-04-19 DIAGNOSIS — Z91011 Allergy to milk products: Secondary | ICD-10-CM

## 2021-04-19 DIAGNOSIS — Z6827 Body mass index (BMI) 27.0-27.9, adult: Secondary | ICD-10-CM

## 2021-04-19 DIAGNOSIS — Z9049 Acquired absence of other specified parts of digestive tract: Secondary | ICD-10-CM

## 2021-04-19 DIAGNOSIS — R0789 Other chest pain: Secondary | ICD-10-CM | POA: Diagnosis not present

## 2021-04-19 DIAGNOSIS — F419 Anxiety disorder, unspecified: Secondary | ICD-10-CM | POA: Diagnosis present

## 2021-04-19 DIAGNOSIS — R091 Pleurisy: Secondary | ICD-10-CM | POA: Diagnosis not present

## 2021-04-19 DIAGNOSIS — R9431 Abnormal electrocardiogram [ECG] [EKG]: Secondary | ICD-10-CM | POA: Diagnosis not present

## 2021-04-19 LAB — CBC
HCT: 45.5 % (ref 36.0–46.0)
Hemoglobin: 15.7 g/dL — ABNORMAL HIGH (ref 12.0–15.0)
MCH: 32 pg (ref 26.0–34.0)
MCHC: 34.5 g/dL (ref 30.0–36.0)
MCV: 92.7 fL (ref 80.0–100.0)
Platelets: 279 10*3/uL (ref 150–400)
RBC: 4.91 MIL/uL (ref 3.87–5.11)
RDW: 12 % (ref 11.5–15.5)
WBC: 8.7 10*3/uL (ref 4.0–10.5)
nRBC: 0 % (ref 0.0–0.2)

## 2021-04-19 LAB — URINALYSIS, ROUTINE W REFLEX MICROSCOPIC
Bilirubin Urine: NEGATIVE
Glucose, UA: NEGATIVE mg/dL
Ketones, ur: NEGATIVE mg/dL
Leukocytes,Ua: NEGATIVE
Nitrite: NEGATIVE
Protein, ur: NEGATIVE mg/dL
Specific Gravity, Urine: 1.01 (ref 1.005–1.030)
pH: 6 (ref 5.0–8.0)

## 2021-04-19 LAB — COMPREHENSIVE METABOLIC PANEL
ALT: 15 U/L (ref 0–44)
AST: 21 U/L (ref 15–41)
Albumin: 3.7 g/dL (ref 3.5–5.0)
Alkaline Phosphatase: 97 U/L (ref 38–126)
Anion gap: 9 (ref 5–15)
BUN: 10 mg/dL (ref 8–23)
CO2: 22 mmol/L (ref 22–32)
Calcium: 9 mg/dL (ref 8.9–10.3)
Chloride: 99 mmol/L (ref 98–111)
Creatinine, Ser: 0.89 mg/dL (ref 0.44–1.00)
GFR, Estimated: >60 mL/min (ref >60)
Glucose, Bld: 113 mg/dL — ABNORMAL HIGH (ref 70–99)
Potassium: 3.9 mmol/L (ref 3.5–5.1)
Sodium: 130 mmol/L — ABNORMAL LOW (ref 135–145)
Total Bilirubin: 0.8 mg/dL (ref 0.3–1.2)
Total Protein: 6.9 g/dL (ref 6.5–8.1)

## 2021-04-19 LAB — LIPASE, BLOOD: Lipase: 20 U/L (ref 11–51)

## 2021-04-19 LAB — RESP PANEL BY RT-PCR (FLU A&B, COVID) ARPGX2
Influenza A by PCR: NEGATIVE
Influenza B by PCR: NEGATIVE
SARS Coronavirus 2 by RT PCR: NEGATIVE

## 2021-04-19 MED ORDER — ONDANSETRON HCL 4 MG PO TABS
4.0000 mg | ORAL_TABLET | Freq: Four times a day (QID) | ORAL | Status: DC | PRN
  Administered 2021-04-20: 10:00:00 4 mg via ORAL
  Filled 2021-04-19: qty 1

## 2021-04-19 MED ORDER — FLUOXETINE HCL 20 MG PO CAPS
20.0000 mg | ORAL_CAPSULE | Freq: Every day | ORAL | Status: DC
  Administered 2021-04-20 – 2021-05-16 (×26): 20 mg via ORAL
  Filled 2021-04-19 (×28): qty 1

## 2021-04-19 MED ORDER — ACETAMINOPHEN 650 MG RE SUPP: 650.0000 mg | Freq: Four times a day (QID) | RECTAL | Status: DC | PRN

## 2021-04-19 MED ORDER — ONDANSETRON HCL 4 MG/2ML IJ SOLN
4.0000 mg | Freq: Four times a day (QID) | INTRAMUSCULAR | Status: DC | PRN
  Administered 2021-04-21 – 2021-04-25 (×7): 4 mg via INTRAVENOUS
  Filled 2021-04-19 (×7): qty 2

## 2021-04-19 MED ORDER — ENOXAPARIN SODIUM 40 MG/0.4ML IJ SOSY
40.0000 mg | PREFILLED_SYRINGE | INTRAMUSCULAR | Status: DC
  Administered 2021-04-20 – 2021-04-22 (×3): 40 mg via SUBCUTANEOUS
  Filled 2021-04-19 (×3): qty 0.4

## 2021-04-19 MED ORDER — DIPHENOXYLATE-ATROPINE 2.5-0.025 MG PO TABS
1.0000 | ORAL_TABLET | Freq: Four times a day (QID) | ORAL | Status: DC | PRN
  Administered 2021-04-20 (×2): 1 via ORAL
  Filled 2021-04-19 (×3): qty 1

## 2021-04-19 MED ORDER — ONDANSETRON HCL 4 MG/2ML IJ SOLN
4.0000 mg | INTRAMUSCULAR | Status: DC | PRN
  Administered 2021-04-19: 20:00:00 4 mg via INTRAVENOUS
  Filled 2021-04-19: qty 2

## 2021-04-19 MED ORDER — LORAZEPAM 2 MG/ML IJ SOLN
1.0000 mg | Freq: Once | INTRAMUSCULAR | Status: AC
  Administered 2021-04-19: 18:00:00 1 mg via INTRAVENOUS
  Filled 2021-04-19: qty 1

## 2021-04-19 MED ORDER — ONDANSETRON 4 MG PO TBDP
4.0000 mg | ORAL_TABLET | Freq: Once | ORAL | Status: AC
  Administered 2021-04-19: 13:00:00 4 mg via ORAL
  Filled 2021-04-19: qty 1

## 2021-04-19 MED ORDER — FLUTICASONE PROPIONATE 50 MCG/ACT NA SUSP
2.0000 | Freq: Every day | NASAL | Status: DC | PRN
  Filled 2021-04-19: qty 16

## 2021-04-19 MED ORDER — ENSURE ENLIVE PO LIQD: 237.0000 mL | Freq: Two times a day (BID) | ORAL | Status: DC

## 2021-04-19 MED ORDER — SODIUM CHLORIDE 0.9 % IV BOLUS
1000.0000 mL | Freq: Once | INTRAVENOUS | Status: AC
  Administered 2021-04-19: 15:00:00 1000 mL via INTRAVENOUS

## 2021-04-19 MED ORDER — ALBUTEROL SULFATE (2.5 MG/3ML) 0.083% IN NEBU
2.5000 mg | INHALATION_SOLUTION | Freq: Four times a day (QID) | RESPIRATORY_TRACT | Status: DC | PRN
  Administered 2021-04-22 – 2021-05-09 (×9): 2.5 mg via RESPIRATORY_TRACT
  Filled 2021-04-19 (×9): qty 3

## 2021-04-19 MED ORDER — IOHEXOL 350 MG/ML SOLN
80.0000 mL | Freq: Once | INTRAVENOUS | Status: AC | PRN
  Administered 2021-04-19: 15:00:00 80 mL via INTRAVENOUS

## 2021-04-19 MED ORDER — LACTATED RINGERS IV BOLUS
500.0000 mL | Freq: Once | INTRAVENOUS | Status: AC
  Administered 2021-04-19: 20:00:00 500 mL via INTRAVENOUS

## 2021-04-19 MED ORDER — ZOLPIDEM TARTRATE 5 MG PO TABS
10.0000 mg | ORAL_TABLET | Freq: Every evening | ORAL | Status: DC | PRN
  Administered 2021-04-19 – 2021-04-25 (×7): 10 mg via ORAL
  Filled 2021-04-19 (×7): qty 2

## 2021-04-19 MED ORDER — HYDROMORPHONE HCL 1 MG/ML IJ SOLN
0.5000 mg | Freq: Once | INTRAMUSCULAR | Status: AC
  Administered 2021-04-19: 15:00:00 0.5 mg via INTRAVENOUS
  Filled 2021-04-19: qty 1

## 2021-04-19 MED ORDER — FAMOTIDINE 20 MG PO TABS: 20.0000 mg | ORAL_TABLET | Freq: Every day | ORAL | Status: DC

## 2021-04-19 MED ORDER — SODIUM CHLORIDE (PF) 0.9 % IJ SOLN
INTRAMUSCULAR | Status: AC
  Filled 2021-04-19: qty 50

## 2021-04-19 MED ORDER — LACTATED RINGERS IV SOLN: INTRAVENOUS | Status: DC

## 2021-04-19 MED ORDER — PREDNISONE 5 MG PO TABS
5.0000 mg | ORAL_TABLET | Freq: Every day | ORAL | Status: DC
  Administered 2021-04-20 – 2021-05-16 (×26): 5 mg via ORAL
  Filled 2021-04-19 (×27): qty 1

## 2021-04-19 MED ORDER — ACETAMINOPHEN 325 MG PO TABS
650.0000 mg | ORAL_TABLET | Freq: Four times a day (QID) | ORAL | Status: DC | PRN
  Administered 2021-04-20 – 2021-05-09 (×5): 650 mg via ORAL
  Filled 2021-04-19 (×8): qty 2

## 2021-04-19 MED ORDER — TRAMADOL HCL 50 MG PO TABS
50.0000 mg | ORAL_TABLET | Freq: Once | ORAL | Status: DC
  Filled 2021-04-19: qty 1

## 2021-04-19 NOTE — ED Provider Notes (Signed)
Emergency Medicine Provider Triage Evaluation Note  Sara Barnes , a 80 y.o. female  was evaluated in triage.  Pt complains of nausea, vomiting, diarrhea, bilateral low back pain, dysuria, decreased appetite, fatigue and chills.  Patient states that she has had decreased appetite and nausea x2 weeks.  Patient has had diarrhea x1 week.  Patient states that nausea and diarrhea have gotten worse the last 3 days.  For the last 3 days patient has also developed dysuria and low back pain.    Review of Systems  Positive: Nausea, vomiting, diarrhea, bilateral low back pain, dysuria, decreased appetite, fatigue, chills, rhinorrhea, nonproductive cough Negative: Fever, abdominal distention, abdominal pain, hematochezia, melena, hematuria, urinary urgency  Physical Exam  BP 139/88    Pulse 86    Temp 97.9 F (36.6 C) (Oral)    Resp 17    SpO2 99%  Gen:   Awake, no distress   Resp:  Normal effort  MSK:   Moves extremities without difficulty  Other:  Abdomen soft, nondistended, minimal tenderness to right upper abdomen.  No guarding or rebound tenderness.  No CVA tenderness bilaterally  Medical Decision Making  Medically screening exam initiated at 12:11 PM.  Appropriate orders placed.  MITSUE PEERY was informed that the remainder of the evaluation will be completed by another provider, this initial triage assessment does not replace that evaluation, and the importance of remaining in the ED until their evaluation is complete.  Abdominal labs ordered.  We will swab patient for COVID-19 and influenza.  Patient given Zofran for her nausea.   Loni Beckwith, PA-C 04/19/21 1214    Varney Biles, MD 04/19/21 [phone]

## 2021-04-19 NOTE — Telephone Encounter (Signed)
Returned sister's call. Sister stated that patient is very weak, continues to vomit and have severe diarrhea. Advised that patient should be taken to the ED. Pt's sister verbalized understanding.

## 2021-04-19 NOTE — H&P (Signed)
History and Physical  Patient Name: Sara Barnes     FSE:[id number]    DOB: 13-Mar-1941    DOA: 04/19/2021 PCP: Ann Held, DO  Patient coming from: Home  Chief Complaint: Intractable nausea, vomiting, diarrhea    HPI: Sara Barnes is a 80 y.o. female, with PMH of insomnia, Crohn's disease, depression, dyspepsia, who presented to the ER on 04/19/2021 with intractable nausea, vomiting, diarrhea.  Patient states for the past 2 weeks or so he has been having multiple bowel movements daily, severe nausea, poor appetite and vomiting.  She has been unable to tolerate much p.o. intake without vomiting.  She feels like he is getting weaker.  She has a history of Crohn's so She has some chronic diarrhea but that this is worse.  At home she takes Imodium and Lomotil.  She is also on prednisone 5 mg daily for her Crohn's.  She has not been hospitalized oblate related to her Crohn's.  She follow-up with GI routinely.  Given her symptoms, she came to the ER for further evaluation.    ED course: -Vitals on admission: Afebrile, heart rate 86, respiratory rate 17, blood pressure 139/88, maintaining sats on room air -Labs on initial presentation: Sodium 130, potassium 3.9, chloride 99, bicarb 22, glucose 113, BUN 10, creatinine 0.89, calcium 9, albumin 3.7, hemoglobin 15.7, WBC 8.7, COVID-negative, UA negative for UTI -Imaging obtained on admission: CT abdomen pelvis demonstrated multiple hypodense hepatic masses, however bowels appeared fine. -In the ED the patient was given Dilaudid, Ativan, IV fluids Zofran, and the hospitalist service was contacted for further evaluation and management.     ROS: A complete and thorough 12 point review of systems obtained, negative listed in HPI.     Past Medical History:  Diagnosis Date   Allergy    Anemia    Anxiety    Arthritis    Basal cell carcinoma    Blood transfusion without reported diagnosis    Bowel perforation ([place])    Cancer of  lower-inner quadrant of female breast ([place]) 11/30/2014   left   Cancer of lower-inner quadrant of left female breast ([place]) 07/29/5684   Complication of anesthesia    anxiety post-op after ear surgeries   Crohn's disease of large intestine with abscess ([place])    Dental crowns present    recent root canal 11/2014   Depression    Family history of adverse reaction to anesthesia    pt's sister has hx. of post-op N/V   GERD (gastroesophageal reflux disease)    Hyperlipidemia    Indigestion    Perianal abscess    Personal history of radiation therapy 2016   PONV (postoperative nausea and vomiting)    Runny nose 12/07/2014   clear drainage, per pt.   Seasonal allergies    TMJ (temporomandibular joint syndrome)    Ulcerative colitis    Vitamin B12 deficiency     Past Surgical History:  Procedure Laterality Date   ABDOMINAL HYSTERECTOMY     partial   APPENDECTOMY     AUGMENTATION MAMMAPLASTY Bilateral    BASAL CELL CARCINOMA EXCISION Left    nose   BREAST BIOPSY Left 2019   benign   BREAST ENHANCEMENT SURGERY     BREAST LUMPECTOMY Left 2016   BREAST LUMPECTOMY WITH RADIOACTIVE SEED AND SENTINEL LYMPH NODE BIOPSY Left 12/13/2014   Procedure: LEFT BREAST LUMPECTOMY WITH RADIOACTIVE SEED AND SENTINEL LYMPH NODE BIOPSY;  Surgeon: [name], MD;  Location: [facility];  Service: General;  Laterality: Left;   COLONOSCOPY WITH PROPOFOL  05/28/2011; 06/28/2014   EXPLORATORY LAPAROTOMY  04/08/2018   with end ileostomy takedown and creation of loop ileostomy-- [place] N/A 01/08/2016   Procedure: Beryle Quant;  Surgeon: [name], MD;  Location: [facility];  Service: Gastroenterology;  Laterality: N/A;  needs MAC due to pain and anxiety   ILEOSTOMY N/A 05/25/2017   Procedure: DILATION OF ILEOSTOMY;  Surgeon: Rolm Bookbinder, MD;  Location: [facility];  Service: General;  Laterality: N/A;    ILEOSTOMY  02/11/2016   END ileostomy.  Dr Hassell Done   ILEOSTOMY CLOSURE N/A 06/02/2017   Procedure: RESECTION OF ILEAL STRICTURE; ILEOSTOMY REVISION ;  Surgeon: Michael Boston, MD;  Location: [facility];  Service: General;  Laterality: N/A;   ILEOSTOMY DILATION  05/25/2017   DILITATION OF ILEAL STRICTURE   ileostomy stricture resection  06/02/2017   with revision   INCISION AND DRAINAGE ABSCESS  2018   INCISION AND DRAINAGE PERIRECTAL ABSCESS N/A 09/28/2013   Procedure: IRRIGATION AND DEBRIDEMENT PERIANAL ABSCESS, proctoscopy;  Surgeon: Shann Medal, MD;  Location: [facility];  Service: General;  Laterality: N/A;   INCISION AND DRAINAGE PERIRECTAL ABSCESS N/A 01/22/2016   Procedure: IRRIGATION AND DEBRIDEMENT PERIRECTAL ABSCESS;  Surgeon: Armandina Gemma, MD;  Location: [facility];  Service: General;  Laterality: N/A;   INNER EAR SURGERY Bilateral    LAPAROSCOPIC DIVERTED COLOSTOMY N/A 02/11/2016   Procedure: LAPAROSCOPIC DIVERTED ILEOSTOMY;  Surgeon: Johnathan Hausen, MD;  Location: [facility];  Service: General;  Laterality: N/A;   LAPAROSCOPY N/A 03/23/2017   Procedure: LAPAROSCOPY DIAGNOSTIC, EXPLORATORY LAPAROTOMY,  BOWEL RESECTION;  Surgeon: Jackolyn Confer, MD;  Location: [facility];  Service: General;  Laterality: N/A;    Social History: Patient lives at home.  The patient walks without assistance.  nonsmoker.  Allergies  Allergen Reactions   Codeine Nausea And Vomiting   Mesalamine Nausea And Vomiting   Nsaids     Crohn's disease = NO NSAIDs   Oxycodone Itching and Rash   Norco [Hydrocodone-Acetaminophen] Nausea Only   Other Itching    PERFUMED LOTIONS  Cant have MRI due to ear implant    Sulfa Antibiotics Rash    Family history: family history includes Anesthesia problems in her sister; Asthma in her sister; Brain cancer in her father; Diabetes in her brother; Emphysema in her maternal grandfather; Kidney disease in her brother; Prostate cancer (age of onset: 40) in her brother; Rheum arthritis in  her brother and mother.  Prior to Admission medications   Medication Sig Start Date End Date Taking? Authorizing Provider  acetaminophen (TYLENOL) 500 MG tablet You can take 2 tablets every 6 hours as needed for pain.  Use this as your primary pain control.  Use Tramadol as a last resort.  You can buy this over the counter at any drug store.    DO NOT TAKE MORE THAN 4000 MG OF TYLENOL PER DAY.  IT CAN HARM YOUR LIVER. 03/31/17   Earnstine Regal, PA-C  cyanocobalamin (,VITAMIN B-12,) 1000 MCG/ML injection As directed 01/16/21   Pyrtle, Lajuan Lines, MD  diphenoxylate-atropine (LOMOTIL) 2.5-0.025 MG tablet Take 1-2 tablets by mouth 3-4 times daily AS NEEDED for severe diarrhea 01/16/21   Pyrtle, Lajuan Lines, MD  famotidine (PEPCID) 20 MG tablet Take 1 tablet (20 mg total) by mouth 2 (two) times daily. 01/16/21   Pyrtle, Lajuan Lines, MD  FLUoxetine (PROZAC) 20 MG capsule  Take 1 capsule (20 mg total) by mouth daily. 01/16/21   Pyrtle, Lajuan Lines, MD  fluticasone (FLONASE) 50 MCG/ACT nasal spray Place 2 sprays into both nostrils daily. 11/12/20   Ann Held, DO  gabapentin (NEURONTIN) 100 MG capsule Take 3 capsules (300 mg total) by mouth 3 (three) times daily. 11/15/20   Marcial Pacas, MD  loperamide (IMODIUM) 2 MG capsule Take 1 capsule (2 mg total) by mouth as needed for diarrhea or loose stools. 11/02/19   Pyrtle, Lajuan Lines, MD  ondansetron (ZOFRAN-ODT) 4 MG disintegrating tablet DISSOLVE ONE TABLET BY MOUTH EVERY 6 HOURS AS NEEDED FOR NAUSEA AND VOMITING 01/16/21   Pyrtle, Lajuan Lines, MD  predniSONE (DELTASONE) 5 MG tablet TAKE 1 TABLET(5 MG) BY MOUTH DAILY 01/16/21   Pyrtle, Lajuan Lines, MD  zolpidem (AMBIEN) 10 MG tablet Take 1 tablet (10 mg total) by mouth at bedtime as needed for sleep. 04/15/21 07/14/21  Pyrtle, Lajuan Lines, MD       Physical Exam: BP 138/83    Pulse 65    Temp 97.9 F (36.6 C) (Oral)    Resp 15    SpO2 97%   General appearance: Well-developed, adult female, alert and in no acute distress .   Eyes: Anicteric,  conjunctiva pink, lids and lashes normal. PERRL.    ENT: No nasal deformity, discharge, epistaxis.  Hearing intact. OP moist without lesions.   Neck: No neck masses.  Trachea midline.  No thyromegaly/tenderness. Lymph: No cervical or supraclavicular lymphadenopathy. Skin: Warm and dry.  No jaundice.  No suspicious rashes or lesions. Cardiac: RRR, nl S1-S2, no murmurs appreciated.  No LE edema.  Radial and pedal pulses 2+ and symmetric. Respiratory: Normal respiratory rate and rhythm.  CTAB without rales or wheezes. Abdomen: Abdomen soft.  No tenderness with palpation. No ascites, distension, hepatosplenomegaly.   MSK: No deformities or effusions of the large joints of the upper or lower extremities bilaterally.  No cyanosis or clubbing. Neuro: Cranial nerves 2 through 12 grossly intact.  Sensation intact to light touch. Speech is fluent.  Marland Kitchen    Psych: Sensorium intact and responding to questions, attention normal.  Behavior appropriate.  Judgment and insight appear normal.    Labs on Admission:  I have personally reviewed following labs and imaging studies: CBC: Recent Labs  Lab 04/19/21 1143  WBC 8.7  HGB 15.7*  HCT 45.5  MCV 92.7  PLT 829   Basic Metabolic Panel: Recent Labs  Lab 04/19/21 1143  NA 130*  K 3.9  CL 99  CO2 22  GLUCOSE 113*  BUN 10  CREATININE 0.89  CALCIUM 9.0   GFR: CrCl cannot be calculated (Unknown ideal weight.).  Liver Function Tests: Recent Labs  Lab 04/19/21 1143  AST 21  ALT 15  ALKPHOS 97  BILITOT 0.8  PROT 6.9  ALBUMIN 3.7   Recent Labs  Lab 04/19/21 1143  LIPASE 20   No results for input(s): AMMONIA in the last 168 hours. Coagulation Profile: No results for input(s): INR, PROTIME in the last 168 hours. Cardiac Enzymes: No results for input(s): CKTOTAL, CKMB, CKMBINDEX, TROPONINI in the last 168 hours. BNP (last 3 results) No results for input(s): PROBNP in the last 8760 hours. HbA1C: No results for input(s): HGBA1C in the  last 72 hours. CBG: No results for input(s): GLUCAP in the last 168 hours. Lipid Profile: No results for input(s): CHOL, HDL, LDLCALC, TRIG, CHOLHDL, LDLDIRECT in the last 72 hours. Thyroid Function Tests: No results for  input(s): TSH, T4TOTAL, FREET4, T3FREE, THYROIDAB in the last 72 hours. Anemia Panel: No results for input(s): VITAMINB12, FOLATE, FERRITIN, TIBC, IRON, RETICCTPCT in the last 72 hours.   Recent Results (from the past 240 hour(s))  Resp Panel by RT-PCR (Flu A&B, Covid) Nasopharyngeal Swab     Status: None   Collection Time: 04/19/21 12:48 PM   Specimen: Nasopharyngeal Swab; Nasopharyngeal(NP) swabs in vial transport medium  Result Value Ref Range Status   SARS Coronavirus 2 by RT PCR NEGATIVE NEGATIVE Final    Comment: (NOTE) SARS-CoV-2 target nucleic acids are NOT DETECTED.  The SARS-CoV-2 RNA is generally detectable in upper respiratory specimens during the acute phase of infection. The lowest concentration of SARS-CoV-2 viral copies this assay can detect is 138 copies/mL. A negative result does not preclude SARS-Cov-2 infection and should not be used as the sole basis for treatment or other patient management decisions. A negative result may occur with  improper specimen collection/handling, submission of specimen other than nasopharyngeal swab, presence of viral mutation(s) within the areas targeted by this assay, and inadequate number of viral copies(<138 copies/mL). A negative result must be combined with clinical observations, patient history, and epidemiological information. The expected result is Negative.  Fact Sheet for Patients:  EntrepreneurPulse.com.au  Fact Sheet for Healthcare Providers:  IncredibleEmployment.be  This test is no t yet approved or cleared by the [place] FDA and  has been authorized for detection and/or diagnosis of SARS-CoV-2 by FDA under an Emergency Use Authorization (EUA). This EUA  will remain  in effect (meaning this test can be used) for the duration of the COVID-19 declaration under Section 564(b)(1) of the Act, 21 U.S.C.section 360bbb-3(b)(1), unless the authorization is terminated  or revoked sooner.       Influenza A by PCR NEGATIVE NEGATIVE Final   Influenza B by PCR NEGATIVE NEGATIVE Final    Comment: (NOTE) The Xpert Xpress SARS-CoV-2/FLU/RSV plus assay is intended as an aid in the diagnosis of influenza from Nasopharyngeal swab specimens and should not be used as a sole basis for treatment. Nasal washings and aspirates are unacceptable for Xpert Xpress SARS-CoV-2/FLU/RSV testing.  Fact Sheet for Patients: EntrepreneurPulse.com.au  Fact Sheet for Healthcare Providers: IncredibleEmployment.be  This test is not yet approved or cleared by the [place] FDA and has been authorized for detection and/or diagnosis of SARS-CoV-2 by FDA under an Emergency Use Authorization (EUA). This EUA will remain in effect (meaning this test can be used) for the duration of the COVID-19 declaration under Section 564(b)(1) of the Act, 21 U.S.C. section 360bbb-3(b)(1), unless the authorization is terminated or revoked.  Performed at [facility], [place] [address], [place], [place] [address]            Radiological Exams on Admission: Personally reviewed imaging which shows: CT abdomen pelvis demonstrated multiple hypodense hepatic masses, however bowels appeared fine. CT ABDOMEN PELVIS W CONTRAST  Result Date: 04/19/2021 CLINICAL DATA:  Nausea, vomiting, abdominal pain, acute nonlocalized. History of left breast with lumpectomy and radiation. EXAM: CT ABDOMEN AND PELVIS WITH CONTRAST TECHNIQUE: Multidetector CT imaging of the abdomen and pelvis was performed using the standard protocol following bolus administration of intravenous contrast. CONTRAST:  20m OMNIPAQUE IOHEXOL 350 MG/ML SOLN COMPARISON:  CT  examination dated November 11, 2019 FINDINGS: Lower chest: Moderate left and small right pleural effusion with left basilar atelectasis. Small hiatal hernia. Hepatobiliary: At least 3 hyperdense masses in the liver. In the left hepatic lobe measuring 2.2 x 2.2 cm (  series 2 image 9). Another hypodense structure abutting the diaphragm measuring 1.4 x 1.6 cm (series 2, image 8). Another hyperdense structure in the inferior aspect of the right hepatic lobe (series 2, image 20). These masses are highly suspicious for metastatic disease until proven otherwise. Gallbladder is unremarkable. No biliary ductal dilatation. Pancreas: Unremarkable. No pancreatic ductal dilatation or surrounding inflammatory changes. Spleen: Normal in size without focal abnormality. Adrenals/Urinary Tract: Adrenal glands are unremarkable. Kidneys are normal, without renal calculi, focal lesion, or hydronephrosis. Bladder is unremarkable. Stomach/Bowel: Stomach is within normal limits. Small bowel loops are normal in caliber. Postsurgical changes for prior hemicolectomy. No evidence of bowel wall thickening, distention, or inflammatory changes. Vascular/Lymphatic: Aortic atherosclerosis. No enlarged abdominal or pelvic lymph nodes. Reproductive: Status post hysterectomy. No adnexal masses. Other: No abdominal wall hernia or abnormality. No abdominopelvic ascites. Musculoskeletal: Mild multilevel degenerative disc disease. No acute osseous abnormality. IMPRESSION: 1. Multiple hypodense hepatic masses, as detailed above, highly suspicious for metastatic disease. Differential include infectious/inflammatory process. Further evaluation with MRI examination with contrast would be helpful. 2. Moderate left and small right pleural effusion. 3. Postsurgical changes for prior hemicolectomy. No bowel obstruction, colitis or diverticulitis. Electronically Signed   By: Keane Police D.O.   On: 04/19/2021 16:13         Assessment/Plan   1.  Intractable  nausea and vomiting, diarrhea -Intractable nausea, vomiting, diarrhea for over 2 weeks -  CT abdomen pelvis demonstrated multiple hypodense hepatic masses, however bowels appeared fine. -Unsure of etiology at this time.  Does not appear to be a Crohn's flare, imaging negative for inflammation -GI panel pending -Continue IV fluids - Antiemetics informed. - Resume home antidiarrheal  2.  History of Crohn's disease -Unclear if related to current presentation - Resume home prednisone - See further plans above  3.  Generalized weakness -Likely related to nausea, vomitting, poor PO intake -Treatment as above  4.  Multiple hepatic lesions -  CT abdomen pelvis demonstrated multiple hypodense hepatic masses, however bowels appeared fine. -New finding -Unable to get MRI due to metal in her ear - Possibly consider oncology consult in the morning vs F/U outpatient  5.  Insomnia -Continue home Ambien  6.  Hyponatremia -On admission sodium 130. -Appears to be an ongoing issue for her. - Urine sodium, urine osmolality ordered - Follow-up labs ordered    DVT prophylaxis: Lovenox Code Status: Full Family Communication: None Disposition Plan: Anticipate discharge home when medically optimized Consults called: None Admission status: MedSurg      Medical decision making: Patient seen at 9:43 PM on 04/19/2021.  The patient was discussed with ER provider.  What exists of the patient's chart was reviewed in depth and summarized above.  Clinical condition: Fair.        Doran Heater [facility] Please page though [place] or Epic secure chat:  For password, contact charge nurse

## 2021-04-19 NOTE — Telephone Encounter (Signed)
Patient's sister, Shary Decamp, called regarding patient.  She states she is having severe diarrhea, can't keep anything on her stomach, and is very weak.  Patient has Chron's.  She is seeking advice as to whether or not she should be seen or if she should be taken to the ER.  Please call patient and advise.

## 2021-04-19 NOTE — ED Triage Notes (Signed)
Pt arrives via ems from home. Pt c/o nausea, vomiting, diarrhea,low back pain and dysuria x 3 days.

## 2021-04-19 NOTE — ED Provider Notes (Signed)
[place] DEPT Provider Note   CSN: [id number] Arrival date & time: 04/19/21  1116     History Chief Complaint  Patient presents with   Emesis   Diarrhea   Dysuria   Back Pain    Sara Barnes is a 80 y.o. female.  Patient with history of Crohn's disease presents today with complaint of nausea, vomiting, and diarrhea. She states that she has had a decreased appetite with nausea for 2 weeks. She states that she has diarrhea at baseline due to her Crohn's disease, however endorses worsening over the past week with more episodes of diarrhea daily. She also states that she has had several episodes of vomiting over the past few days. She has been taken her home prescribed Zofran without relief. She states that her normal Crohn's flares usually do not have vomiting. She has also had some generalized abdominal pain and states she feels more distended than normal. Endorses she has barely eaten anything since symptom onset. Additionally, she states that over the past 3 days she has developed dysuria and bilateral flank pain which is worse on the left side. Hx of several abdominal surgeries with bowel resection and stoma placement with reversal. She denies fevers, chills, chest pain, shortness of breath, hematemesis, melena, hematochezia, or hematuria.    The history is provided by the patient. No language interpreter was used.  Emesis Associated symptoms: abdominal pain and diarrhea   Associated symptoms: no chills, no cough, no fever, no headaches and no sore throat   Diarrhea Associated symptoms: abdominal pain and vomiting   Associated symptoms: no chills, no diaphoresis, no fever and no headaches   Dysuria Associated symptoms: abdominal pain, flank pain, nausea and vomiting   Associated symptoms: no fever   Back Pain Associated symptoms: abdominal pain and dysuria   Associated symptoms: no chest pain, no fever, no headaches, no numbness and no weakness        Past Medical History:  Diagnosis Date   Allergy    Anemia    Anxiety    Arthritis    Basal cell carcinoma    Blood transfusion without reported diagnosis    Bowel perforation (HCC)    Cancer of lower-inner quadrant of female breast ([place]) 11/30/2014   left   Cancer of lower-inner quadrant of left female breast ([place]) 0/11/6576   Complication of anesthesia    anxiety post-op after ear surgeries   Crohn's disease of large intestine with abscess ([place])    Dental crowns present    recent root canal 11/2014   Depression    Family history of adverse reaction to anesthesia    pt's sister has hx. of post-op N/V   GERD (gastroesophageal reflux disease)    Hyperlipidemia    Indigestion    Perianal abscess    Personal history of radiation therapy 2016   PONV (postoperative nausea and vomiting)    Runny nose 12/07/2014   clear drainage, per pt.   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   Osteopenia of neck of left femur 03/26/2020   Preventative health care 03/26/2020   Closed fracture of shaft of right fibula with routine healing 02/02/2020   Hyponatremia 01/23/2020   Urinary frequency 01/23/2020   Dysphagia  01/23/2020   Acute pansinusitis 01/08/2018   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 06/02/2017 05/28/2017   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 05/21/2017   Right ear pain 05/19/2017   Dizziness 05/19/2017   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 03/24/2017   Chronic diastolic CHF (congestive heart failure) ([place]) 08/05/2016   Chest discomfort 06/21/2016   Nausea and vomiting    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   Small bowel obstruction ([place])    Ileus (HCC)    Generalized anxiety disorder 02/14/2016   Ileostomy in  place  02/11/2016   Hypokalemia    Fever    Crohn's disease of colon with complication ([place])    Other depression due to general medical condition    Difficulty in walking, not elsewhere classified    Acute blood loss anemia    Absolute anemia    Left upper quadrant pain    Abdominal pain 01/07/2016   GI bleed 01/07/2016   IBS (irritable bowel syndrome)    Tenesmus    Ulcerative pancolitis with rectal bleeding ([place])    Diarrhea 12/24/2015   Crohn disease ([place]) 12/24/2015   Dehydration    Ulcerative pancolitis with abscess (HCC)    Rectal pain    Leucocytosis    Cancer of lower-inner quadrant of left female breast ([place]) 11/30/2014   Perirectal abscess 09/29/2013   Vitamin B12 deficiency 08/12/2006   Depression 08/12/2006    Past Surgical History:  Procedure Laterality Date   ABDOMINAL HYSTERECTOMY     partial   APPENDECTOMY     AUGMENTATION MAMMAPLASTY Bilateral    BASAL CELL CARCINOMA EXCISION Left    nose   BREAST BIOPSY Left 2019   benign   BREAST ENHANCEMENT SURGERY     BREAST LUMPECTOMY Left 2016   BREAST LUMPECTOMY WITH RADIOACTIVE SEED AND SENTINEL LYMPH NODE BIOPSY Left 12/13/2014   Procedure: LEFT BREAST LUMPECTOMY WITH RADIOACTIVE SEED AND SENTINEL LYMPH NODE BIOPSY;  Surgeon: [name], MD;  Location: [facility];  Service: General;  Laterality: Left;   COLONOSCOPY WITH PROPOFOL  05/28/2011; 06/28/2014   EXPLORATORY LAPAROTOMY  04/08/2018   with end ileostomy takedown and creation of loop ileostomy-- [facility] [place] N/A 01/08/2016   Procedure: Beryle Quant;  Surgeon: [name], MD;  Location: [facility];  Service: Gastroenterology;  Laterality: N/A;  needs MAC due to pain and anxiety   ILEOSTOMY N/A 05/25/2017   Procedure: DILATION OF ILEOSTOMY;  Surgeon: Rolm Bookbinder, MD;  Location: [facility];  Service: General;  Laterality: N/A;   ILEOSTOMY  02/11/2016   END  ileostomy.  Dr Hassell Done   ILEOSTOMY CLOSURE N/A 06/02/2017   Procedure: RESECTION OF ILEAL STRICTURE; ILEOSTOMY REVISION ;  Surgeon: Michael Boston, MD;  Location: [facility];  Service: General;  Laterality: N/A;   ILEOSTOMY DILATION  05/25/2017   DILITATION OF ILEAL STRICTURE   ileostomy stricture resection  06/02/2017   with revision   INCISION AND DRAINAGE ABSCESS  2018   INCISION AND DRAINAGE PERIRECTAL ABSCESS N/A 09/28/2013   Procedure: IRRIGATION AND DEBRIDEMENT PERIANAL ABSCESS, proctoscopy;  Surgeon: Shann Medal, MD;  Location: [facility];  Service: General;  Laterality: N/A;   INCISION AND DRAINAGE PERIRECTAL ABSCESS N/A 01/22/2016   Procedure: IRRIGATION AND DEBRIDEMENT PERIRECTAL ABSCESS;  Surgeon: Armandina Gemma, MD;  Location: [facility];  Service: General;  Laterality: N/A;   INNER EAR SURGERY Bilateral    LAPAROSCOPIC DIVERTED COLOSTOMY N/A 02/11/2016  Procedure: LAPAROSCOPIC DIVERTED ILEOSTOMY;  Surgeon: Johnathan Hausen, MD;  Location: [facility];  Service: General;  Laterality: N/A;   LAPAROSCOPY N/A 03/23/2017   Procedure: LAPAROSCOPY DIAGNOSTIC, EXPLORATORY LAPAROTOMY,  BOWEL RESECTION;  Surgeon: Jackolyn Confer, MD;  Location: [facility];  Service: General;  Laterality: N/A;     OB History   No obstetric history on file.     Family History  Problem Relation Age of Onset   Prostate cancer Brother 64   Kidney disease Brother    Diabetes Brother    Brain cancer Father    Rheum arthritis Mother    Anesthesia problems Sister        post-op N/V   Emphysema Maternal Grandfather    Asthma Sister    Rheum arthritis Brother    Esophageal cancer Neg Hx    Breast cancer Neg Hx    Rectal cancer Neg Hx    Stomach cancer Neg Hx    Colon cancer Neg Hx     Social History   Tobacco Use   Smoking status: Never   Smokeless tobacco: Never  Vaping Use   Vaping Use: Never used  Substance Use Topics   Alcohol use: Yes    Alcohol/week: 7.0 standard drinks    Types: 7 Glasses of wine per week     Comment: wine daily   Drug use: No    Home Medications Prior to Admission medications   Medication Sig Start Date End Date Taking? Authorizing Provider  acetaminophen (TYLENOL) 500 MG tablet You can take 2 tablets every 6 hours as needed for pain.  Use this as your primary pain control.  Use Tramadol as a last resort.  You can buy this over the counter at any drug store.    DO NOT TAKE MORE THAN 4000 MG OF TYLENOL PER DAY.  IT CAN HARM YOUR LIVER. 03/31/17   Earnstine Regal, PA-C  cyanocobalamin (,VITAMIN B-12,) 1000 MCG/ML injection As directed 01/16/21   Pyrtle, Lajuan Lines, MD  diphenoxylate-atropine (LOMOTIL) 2.5-0.025 MG tablet Take 1-2 tablets by mouth 3-4 times daily AS NEEDED for severe diarrhea 01/16/21   Pyrtle, Lajuan Lines, MD  famotidine (PEPCID) 20 MG tablet Take 1 tablet (20 mg total) by mouth 2 (two) times daily. 01/16/21   Pyrtle, Lajuan Lines, MD  FLUoxetine (PROZAC) 20 MG capsule Take 1 capsule (20 mg total) by mouth daily. 01/16/21   Pyrtle, Lajuan Lines, MD  fluticasone (FLONASE) 50 MCG/ACT nasal spray Place 2 sprays into both nostrils daily. 11/12/20   Ann Held, DO  gabapentin (NEURONTIN) 100 MG capsule Take 3 capsules (300 mg total) by mouth 3 (three) times daily. 11/15/20   Marcial Pacas, MD  loperamide (IMODIUM) 2 MG capsule Take 1 capsule (2 mg total) by mouth as needed for diarrhea or loose stools. 11/02/19   Pyrtle, Lajuan Lines, MD  ondansetron (ZOFRAN-ODT) 4 MG disintegrating tablet DISSOLVE ONE TABLET BY MOUTH EVERY 6 HOURS AS NEEDED FOR NAUSEA AND VOMITING 01/16/21   Pyrtle, Lajuan Lines, MD  predniSONE (DELTASONE) 5 MG tablet TAKE 1 TABLET(5 MG) BY MOUTH DAILY 01/16/21   Pyrtle, Lajuan Lines, MD  zolpidem (AMBIEN) 10 MG tablet Take 1 tablet (10 mg total) by mouth at bedtime as needed for sleep. 04/15/21 07/14/21  Pyrtle, Lajuan Lines, MD    Allergies    Codeine, Mesalamine, Nsaids, Oxycodone, Norco [hydrocodone-acetaminophen], Other, and Sulfa antibiotics  Review of Systems   Review of Systems  Constitutional:   Positive for appetite change and fatigue.  Negative for activity change, chills, diaphoresis and fever.  HENT:  Negative for congestion, postnasal drip, rhinorrhea, sore throat, trouble swallowing and voice change.   Respiratory:  Negative for cough and shortness of breath.   Cardiovascular:  Negative for chest pain.  Gastrointestinal:  Positive for abdominal distention, abdominal pain, diarrhea, nausea and vomiting. Negative for blood in stool, constipation and rectal pain.  Genitourinary:  Positive for dysuria and flank pain. Negative for decreased urine volume, difficulty urinating, hematuria and urgency.  Musculoskeletal:  Positive for back pain.  Neurological:  Negative for dizziness, tremors, seizures, syncope, facial asymmetry, speech difficulty, weakness, light-headedness, numbness and headaches.  Psychiatric/Behavioral:  Negative for confusion and decreased concentration.   All other systems reviewed and are negative.  Physical Exam Updated Vital Signs BP (!) 147/74    Pulse 81    Temp 97.9 F (36.6 C) (Oral)    Resp 16    SpO2 99%   Physical Exam Vitals and nursing note reviewed.  Constitutional:      Appearance: She is normal weight.     Comments: Patient someone chronically ill appearing laying in bed in no acute distress  HENT:     Head: Normocephalic and atraumatic.     Nose: Nose normal.     Mouth/Throat:     Mouth: Mucous membranes are dry.  Eyes:     Extraocular Movements: Extraocular movements intact.  Cardiovascular:     Rate and Rhythm: Normal rate and regular rhythm.     Heart sounds: Normal heart sounds.  Pulmonary:     Effort: Pulmonary effort is normal. No respiratory distress.     Breath sounds: Normal breath sounds. No stridor. No wheezing, rhonchi or rales.  Abdominal:     General: Bowel sounds are normal. There is distension.     Tenderness: There is abdominal tenderness. There is no right CVA tenderness or left CVA tenderness.     Comments: Mildly  distended abdomen with generalized tenderness and several surgical scars present  Musculoskeletal:        General: Normal range of motion.     Cervical back: Normal range of motion.     Comments: No step-offs, lesions, deformity, or midline tenderness noted to cervical, thoracic, or lumbar spine.  Skin:    General: Skin is warm and dry.  Neurological:     General: No focal deficit present.     Mental Status: She is alert.  Psychiatric:        Mood and Affect: Mood normal.        Behavior: Behavior normal.    ED Results / Procedures / Treatments   Labs (all labs ordered are listed, but only abnormal results are displayed) Labs Reviewed  COMPREHENSIVE METABOLIC PANEL - Abnormal; Notable for the following components:      Result Value   Sodium 130 (*)    Glucose, Bld 113 (*)    All other components within normal limits  CBC - Abnormal; Notable for the following components:   Hemoglobin 15.7 (*)    All other components within normal limits  URINALYSIS, ROUTINE W REFLEX MICROSCOPIC - Abnormal; Notable for the following components:   Hgb urine dipstick MODERATE (*)    Bacteria, UA RARE (*)    All other components within normal limits  RESP PANEL BY RT-PCR (FLU A&B, COVID) ARPGX2  URINE CULTURE  LIPASE, BLOOD    EKG None  Radiology No results found.  Procedures Procedures   Medications Ordered in ED Medications  sodium chloride (PF) 0.9 % injection (has no administration in time range)  ondansetron (ZOFRAN-ODT) disintegrating tablet 4 mg (4 mg Oral Given 04/19/21 1242)  sodium chloride 0.9 % bolus 1,000 mL (1,000 mLs Intravenous New Bag/Given 04/19/21 1511)  HYDROmorphone (DILAUDID) injection 0.5 mg (0.5 mg Intravenous Given 04/19/21 1523)  iohexol (OMNIPAQUE) 350 MG/ML injection 80 mL (80 mLs Intravenous Contrast Given 04/19/21 1529)    ED Course  I have reviewed the triage vital signs and the nursing notes.  Pertinent labs & imaging results that were available  during my care of the patient were reviewed by me and considered in my medical decision making (see chart for details).    MDM Rules/Calculators/A&P                         Patient with Crohn's presents today with 2 weeks of nausea vomiting and diarrhea with new development of dysuria and back pain.   No leukocytosis or anemia. Hyponatremia per baseline. No change in kidney or liver function. Lipase normal. COVID and flu negative. UA with hematuria but no signs of infection. Culture pending.  Given abdominal tenderness, feel patient needs CT abdomen and pelvis with contrast. This is pending at shift change. If this is normal, suspect patient can be discharged with GI consult for medication management of Crohn's disease. Patient given fluid bolus, pain control, and anti-emetics. Will need reassessment for improvement following CT results.  Care handoff to [facility], PA-C at shift change. Please see her note for further evaluation and dispo.   This is a shared visit with supervising physician Dr. Roderic Palau who has independently evaluated patient & provided guidance in evaluation/management/disposition, in agreement with care     Final Clinical Impression(s) / ED Diagnoses Final diagnoses:  Nausea vomiting and diarrhea    Rx / DC Orders ED Discharge Orders     None        Nestor Lewandowsky 04/19/21 1619    Milton Ferguson, MD 04/21/21 [phone]

## 2021-04-19 NOTE — ED Provider Notes (Signed)
Patient has been having intractable vomiting and diarrhea.  Patient has known history of Crohn's disease.  She reports this is worse than any previous Crohn's flares.  She has having frequent diarrhea that is uncontrollable.  In conjunction, she is also experiencing severe nausea and loss of appetite with vomiting.  Patient has been trying measures at home to control symptoms without relief.  She reports that if she eats something she is immediately vomiting and she is trying to keep water in but frequently will vomit after that as well.  She has not had fever.  She reports some abdominal distention discomfort but not severe abdominal pain.  She reports she has more pain generally in her back.  Patient ports she has been feeling very weak with standing.  She reports she is felt lightheaded as though she might pass out although she has not collapsed. Physical Exam  BP (!) 167/76    Pulse 73    Temp 97.9 F (36.6 C) (Oral)    Resp 18    SpO2 100%   Physical Exam Constitutional:      Comments: Patient is alert.  She does have clear mental status.  No respiratory distress.  HENT:     Head: Normocephalic and atraumatic.     Mouth/Throat:     Pharynx: Oropharynx is clear.  Eyes:     Extraocular Movements: Extraocular movements intact.  Cardiovascular:     Rate and Rhythm: Normal rate and regular rhythm.  Pulmonary:     Effort: Pulmonary effort is normal.  Abdominal:     Comments: Abdomen soft without guarding.  Mild diffuse discomfort to palpation.  Skin:    General: Skin is warm and dry.  Neurological:     General: No focal deficit present.    ED Course/Procedures     Procedures  MDM  Patient presents as outlined.  She has had intractable vomiting and diarrhea.  She continues to have watery and frequent stool in the emergency department.  Patient is tolerating oral intake poorly.  This time with history of Crohn's and advanced age, plan for admission for treatment of intractable vomiting  and diarrhea.  CT shows incidental finding of concerning hepatic densities suggestive of metastatic disease.  At this time, no clear indication as to a potential site of metastatic disease.  Patient does have prior history of breast cancer that was treated.  Review of systems does not identify other obvious etiology.  I have extensively counseled the patient that this will require further diagnostic evaluation to define malignancy, potential source or other possible etiologies.  Patient voices understanding.       Charlesetta Shanks, MD 04/19/21 [phone]

## 2021-04-20 LAB — URINE CULTURE: Culture: NO GROWTH

## 2021-04-20 LAB — COMPREHENSIVE METABOLIC PANEL
ALT: 14 U/L (ref 0–44)
AST: 18 U/L (ref 15–41)
Albumin: 2.8 g/dL — ABNORMAL LOW (ref 3.5–5.0)
Alkaline Phosphatase: 66 U/L (ref 38–126)
Anion gap: 7 (ref 5–15)
BUN: 7 mg/dL — ABNORMAL LOW (ref 8–23)
CO2: 23 mmol/L (ref 22–32)
Calcium: 8.4 mg/dL — ABNORMAL LOW (ref 8.9–10.3)
Chloride: 104 mmol/L (ref 98–111)
Creatinine, Ser: 0.76 mg/dL (ref 0.44–1.00)
GFR, Estimated: >60 mL/min (ref >60)
Glucose, Bld: 85 mg/dL (ref 70–99)
Potassium: 3.8 mmol/L (ref 3.5–5.1)
Sodium: 134 mmol/L — ABNORMAL LOW (ref 135–145)
Total Bilirubin: 0.7 mg/dL (ref 0.3–1.2)
Total Protein: 5.3 g/dL — ABNORMAL LOW (ref 6.5–8.1)

## 2021-04-20 LAB — MAGNESIUM: Magnesium: 1.8 mg/dL (ref 1.7–2.4)

## 2021-04-20 LAB — GASTROINTESTINAL PANEL BY PCR, STOOL (REPLACES STOOL CULTURE)

## 2021-04-20 LAB — SODIUM, URINE, RANDOM: Sodium, Ur: <10 mmol/L

## 2021-04-20 LAB — C DIFFICILE QUICK SCREEN W PCR REFLEX
C Diff antigen: POSITIVE — AB
C Diff toxin: NEGATIVE

## 2021-04-20 LAB — CBC
HCT: 38.2 % (ref 36.0–46.0)
Hemoglobin: 12.8 g/dL (ref 12.0–15.0)
MCH: 31.4 pg (ref 26.0–34.0)
MCHC: 33.5 g/dL (ref 30.0–36.0)
MCV: 93.6 fL (ref 80.0–100.0)
Platelets: 213 10*3/uL (ref 150–400)
RBC: 4.08 MIL/uL (ref 3.87–5.11)
RDW: 11.9 % (ref 11.5–15.5)
WBC: 6.2 10*3/uL (ref 4.0–10.5)
nRBC: 0 % (ref 0.0–0.2)

## 2021-04-20 LAB — OSMOLALITY, URINE: Osmolality, Ur: 199 mOsm/kg — ABNORMAL LOW (ref 300–900)

## 2021-04-20 LAB — CLOSTRIDIUM DIFFICILE BY PCR, REFLEXED: Toxigenic C. Difficile by PCR: POSITIVE — AB

## 2021-04-20 LAB — PHOSPHORUS: Phosphorus: 3.4 mg/dL (ref 2.5–4.6)

## 2021-04-20 MED ORDER — BOOST / RESOURCE BREEZE PO LIQD CUSTOM
1.0000 | Freq: Three times a day (TID) | ORAL | Status: DC
  Administered 2021-04-21 – 2021-05-03 (×15): 1 via ORAL

## 2021-04-20 MED ORDER — LIP MEDEX EX OINT
1.0000 "application " | TOPICAL_OINTMENT | CUTANEOUS | Status: DC | PRN
  Filled 2021-04-20 (×2): qty 7

## 2021-04-20 MED ORDER — MAGNESIUM SULFATE 2 GM/50ML IV SOLN
2.0000 g | Freq: Once | INTRAVENOUS | Status: AC
  Administered 2021-04-20: 10:00:00 2 g via INTRAVENOUS
  Filled 2021-04-20: qty 50

## 2021-04-20 MED ORDER — DIPHENOXYLATE-ATROPINE 2.5-0.025 MG PO TABS: 2.0000 | ORAL_TABLET | Freq: Two times a day (BID) | ORAL | Status: DC

## 2021-04-20 MED ORDER — VANCOMYCIN HCL 125 MG PO CAPS
125.0000 mg | ORAL_CAPSULE | Freq: Four times a day (QID) | ORAL | Status: DC
  Administered 2021-04-20 – 2021-04-24 (×16): 125 mg via ORAL
  Filled 2021-04-20 (×21): qty 1

## 2021-04-20 MED ORDER — LOPERAMIDE HCL 2 MG PO CAPS: 4.0000 mg | ORAL_CAPSULE | Freq: Two times a day (BID) | ORAL | Status: DC

## 2021-04-20 MED ORDER — ADULT MULTIVITAMIN W/MINERALS CH
1.0000 | ORAL_TABLET | Freq: Every day | ORAL | Status: DC
  Administered 2021-04-21 – 2021-05-05 (×5): 1 via ORAL
  Filled 2021-04-20 (×22): qty 1

## 2021-04-20 NOTE — Progress Notes (Signed)
Transition of Care [facility]) Screening Note  Patient Details  Name: Sara Barnes Date of Birth: [birth year]  Transition of Care [facility]) CM/SW Contact:    Sherie Don, LCSW Phone Number: 04/20/2021, 9:23 AM  Transition of Care Department [facility]) has reviewed patient and no TOC needs have been identified at this time. We will continue to monitor patient advancement through interdisciplinary progression rounds. If new patient transition needs arise, please place a TOC consult.

## 2021-04-20 NOTE — Progress Notes (Signed)
PROGRESS NOTE    Sara Barnes  ZOX:[id number] DOB: [birth year] DOA: 04/19/2021 PCP: Ann Held, DO   Brief Narrative:  The patient is a 80 year old Caucasian female with a past medical history significant for but not limited to insomnia, Crohn's disease, depression, dyspepsia as well as other comorbidities who presented to the ED on 04/19/2021 with intractable nausea, vomiting and diarrhea.  Patient states for last 2 weeks she has been having multiple bowel movements daily with severe nausea and poor p.o. appetite and has been vomiting.  She is unable to tolerate much p.o. intake without vomiting.  She states that she feels like she is getting weaker and she does have a history of Crohn's disease and does have some chronic diarrhea but feels this is worse than her baseline.  At home she takes Imodium and Lomotil and is also on 5 mg of prednisone for Crohn's.  She follows up regularly with GI Dr. Hilarie Fredrickson and because her symptoms progressively got worse she presented to the ED for further evaluation.  On admission her vital signs were relatively stable and labs showed a sodium of 130.  Imaging obtained in the ED showed showed multiple hypodense hepatic masses however her bowels appeared fine.  In the ED she is given Dilaudid, Ativan, IV fluids, Zofran and the hospitalist service was consulted for further evaluation and management.  Further work-up reveals that she is positive for C. difficile toxigenic PCR and antigen so she is initially on C. difficile treatment and will be placed on probiotics as well.   Assessment & Plan:   Principal Problem:   Intractable nausea and vomiting Active Problems:   Depression   Diarrhea   Crohn disease (HCC)   Hyponatremia  Intractable nausea and vomiting associated with diarrhea in the setting of C. difficile diarrhea -CT of the abdomen pelvis demonstrated multiple hypodense hepatic masses however her bowels appear to be fine -Unlikely a Crohn's flare  given negative inflammation but given her symptoms but GI pathogen panel and C. difficile testing were ordered -C. difficile was positive antigen and PCR is positive for toxigenic C. difficile -Initiated on p.o. vancomycin -We will continue antiemetics with p.o./IV ondansetron every 6 as needed nausea -She received a normal saline bolus in the ED along with a lactated Ringer's 500 mL bolus and then placed on maintenance IV fluids with LR at 125 MLS per hour which we will continue -Continue with supportive care and initiate probiotics -Currently the patient is afebrile and has no white count and WBC is now 6.2 -We will hold her PPI and famotidine currently given her diarrhea -GI was consulted for further evaluation given her intractable nausea vomiting diarrhea and initially had placed her back on her antidiarrheals but will hold given her C. difficile being positive  Crohn's Disease -Unclear if this is likely related to her current presentation -Resume 5 mg of p.o. prednisone -GI initiated back on her antidiarrheals but will hold given her C. 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 Truitt Merle via Epic  -I spoke with Dr. Owens Shark of the IR team who request to place a ultrasound-guided biopsy and this will likely be done Tuesday, 04/23/2021  Hyponatremia -Mild and Improving -Na+ went from 130 -> 134 -C/w IVF Hydration  as above -Repeat CMP in the AM   Depression and Anxiety -C/w Fluoxetine 20 mg po Daily   Insomnia  -C/w Zolpidem 10 mg po qHSprn Sleep   DVT prophylaxis: Enoxaparin 40 mg sq q24h Code Status: FULL CODE  Family Communication: Full code Disposition Plan: Pending further clinical improvement  Status is: Inpatient  Remains inpatient appropriate because: Patient continues have significant diarrhea and is extremely weak from the C Diff  Consultants:  Gastroenterology   Procedures:  None  Antimicrobials:  Anti-infectives (From admission, onward)    Start     Dose/Rate Route Frequency Ordered Stop   04/20/21 1500  vancomycin (VANCOCIN) capsule 125 mg        125 mg Oral 4 times daily 04/20/21 1405 04/30/21 1359        Subjective: The patient was seen and examined at bedside and she is still having some nausea and some diarrhea.  States that she feels extremely weak.  No chest pain or shortness of breath.  Denies any lightheadedness or dizziness.  Feels okay but states that she has very poor appetite.  No other concerns or complaints at this time.  Objective: Vitals:   04/20/21 0239 04/20/21 0640 04/20/21 0950 04/20/21 1330  BP: (!) 137/95 135/79 115/76 119/65  Pulse: 71 66 80 79  Resp: 18 18 20 17   Temp: 97.9 F (36.6 C) 98.2 F (36.8 C) 97.6 F (36.4 C) (!) 97.4 F (36.3 C)  TempSrc: Oral Oral    SpO2: 100% 100% 96% 98%  Weight:      Height:        Intake/Output Summary (Last 24 hours) at 04/20/2021 1410 Last data filed at 04/20/2021 1000 Gross per 24 hour  Intake 1126.7 ml  Output --  Net 1126.7 ml   Filed Weights   04/19/21 2130  Weight: 72.1 kg   Examination: Physical Exam:  Constitutional: WN/WD overweight Caucasian female currently in no acute distress Eyes: Lids and conjunctivae normal, sclerae anicteric  ENMT: External Ears, Nose appear normal. Grossly normal hearing. Mucous membranes are moist.  Neck: Appears normal, supple, no cervical masses,  normal ROM, no appreciable thyromegaly; no appreciable JVD Respiratory: Diminished to auscultation bilaterally, no wheezing, rales, rhonchi or crackles. Normal respiratory effort and patient is not tachypenic. No accessory muscle use.  Unlabored breathing Cardiovascular: RRR, no murmurs / rubs / gallops. S1 and S2 auscultated. No extremity edema.  Abdomen: Soft, slightly-tender, distended secondary body habitus. No masses palpated. No appreciable hepatosplenomegaly. Bowel sounds positive.  GU: Deferred. Musculoskeletal: No clubbing / cyanosis of digits/nails. No joint deformity upper and lower extremities. Skin: No rashes, lesions, ulcers on limited skin evaluation. No induration; Warm and dry.  Neurologic: CN 2-12 grossly intact with no focal deficits.  Romberg sign and cerebellar reflexes not assessed.  Psychiatric: Normal judgment and insight. Alert and oriented x 3. Normal mood and appropriate affect.   Data Reviewed: I have personally reviewed following labs and imaging studies  CBC: Recent Labs  Lab 04/19/21 1143 04/20/21 0439  WBC 8.7 6.2  HGB 15.7* 12.8  HCT 45.5 38.2  MCV 92.7 93.6  PLT 279 161   Basic Metabolic Panel:  Recent Labs  Lab 04/19/21 1143 04/20/21 0439  NA 130* 134*  K 3.9 3.8  CL 99 104  CO2 22 23  GLUCOSE 113* 85  BUN 10 7*  CREATININE 0.89 0.76  CALCIUM 9.0 8.4*  MG  --  1.8  PHOS  --  3.4   GFR: Estimated Creatinine Clearance: 58 mL/min (by C-G formula based on SCr of 0.76 mg/dL). Liver Function Tests: Recent Labs  Lab 04/19/21 1143 04/20/21 0439  AST 21 18  ALT 15 14  ALKPHOS 97 66  BILITOT 0.8 0.7  PROT 6.9 5.3*  ALBUMIN 3.7 2.8*   Recent Labs  Lab 04/19/21 1143  LIPASE 20   No results for input(s): AMMONIA in the last 168 hours. Coagulation Profile: No results for input(s): INR, PROTIME in the last 168 hours. Cardiac Enzymes: No results for input(s): CKTOTAL, CKMB, CKMBINDEX, TROPONINI in the last 168 hours. BNP (last 3  results) No results for input(s): PROBNP in the last 8760 hours. HbA1C: No results for input(s): HGBA1C in the last 72 hours. CBG: No results for input(s): GLUCAP in the last 168 hours. Lipid Profile: No results for input(s): CHOL, HDL, LDLCALC, TRIG, CHOLHDL, LDLDIRECT in the last 72 hours. Thyroid Function Tests: No results for input(s): TSH, T4TOTAL, FREET4, T3FREE, THYROIDAB in the last 72 hours. Anemia Panel: No results for input(s): VITAMINB12, FOLATE, FERRITIN, TIBC, IRON, RETICCTPCT in the last 72 hours. Sepsis Labs: No results for input(s): PROCALCITON, LATICACIDVEN in the last 168 hours.  Recent Results (from the past 240 hour(s))  Resp Panel by RT-PCR (Flu A&B, Covid) Nasopharyngeal Swab     Status: None   Collection Time: 04/19/21 12:48 PM   Specimen: Nasopharyngeal Swab; Nasopharyngeal(NP) swabs in vial transport medium  Result Value Ref Range Status   SARS Coronavirus 2 by RT PCR NEGATIVE NEGATIVE Final    Comment: (NOTE) SARS-CoV-2 target nucleic acids are NOT DETECTED.  The SARS-CoV-2 RNA is generally detectable in upper respiratory specimens during the acute phase of infection. The lowest concentration of SARS-CoV-2 viral copies this assay can detect is 138 copies/mL. A negative result does not preclude SARS-Cov-2 infection and should not be used as the sole basis for treatment or other patient management decisions. A negative result may occur with  improper specimen collection/handling, submission of specimen other than nasopharyngeal swab, presence of viral mutation(s) within the areas targeted by this assay, and inadequate number of viral copies(<138 copies/mL). A negative result must be combined with clinical observations, patient history, and epidemiological information. The expected result is Negative.  Fact Sheet for Patients:  EntrepreneurPulse.com.au  Fact Sheet for Healthcare Providers:   IncredibleEmployment.be  This test is no t yet approved or cleared by the [place] FDA and  has been authorized for detection and/or diagnosis of SARS-CoV-2 by FDA under an Emergency Use Authorization (EUA). This EUA will remain  in effect (meaning this test can be used) for the duration of the COVID-19 declaration under Section 564(b)(1) of the Act, 21 U.S.C.section 360bbb-3(b)(1), unless the authorization is terminated  or revoked sooner.       Influenza A by PCR NEGATIVE NEGATIVE Final   Influenza B by PCR NEGATIVE NEGATIVE Final    Comment: (NOTE) The Xpert Xpress SARS-CoV-2/FLU/RSV plus assay is intended as an aid in the diagnosis of influenza from Nasopharyngeal swab specimens and should not be used as a sole basis for treatment. Nasal washings and aspirates are unacceptable for Xpert Xpress SARS-CoV-2/FLU/RSV testing.  Fact Sheet for Patients: EntrepreneurPulse.com.au  Fact Sheet for Healthcare Providers: IncredibleEmployment.be  This test is not yet approved or cleared by the [place] FDA and has been authorized for detection and/or diagnosis of SARS-CoV-2 by FDA under an Emergency Use Authorization (EUA). This EUA will remain in effect (meaning this test can be used) for the duration of the COVID-19 declaration under Section 564(b)(1) of the Act, 21 U.S.C. section 360bbb-3(b)(1), unless the authorization is terminated or revoked.  Performed at [facility], [place] [address], [place], [place] [address]   Urine Culture     Status: None   Collection Time: 04/19/21 12:56 PM   Specimen: Urine, Clean Catch  Result Value Ref Range Status   Specimen Description   Final    URINE, CLEAN CATCH Performed at [facility], [place] [address], [place], [place] [address]    Special Requests   Final    NONE Performed at [facility], [place] [address],  [place], [place] [address]    Culture   Final    NO GROWTH Performed at [facility], [place] [address] [place], [place] [address]    Report Status 04/20/2021 FINAL  Final  C Difficile Quick Screen w PCR reflex     Status: Abnormal   Collection Time: 04/20/21 10:01 AM   Specimen: STOOL  Result Value Ref Range Status   C Diff antigen POSITIVE (A) NEGATIVE Final   C Diff toxin NEGATIVE NEGATIVE Final   C Diff interpretation Results are indeterminate. See PCR results.  Final    Comment: Performed at [facility], [place] [address] [place], [place] [address]  C. Diff by PCR, Reflexed     Status: Abnormal   Collection Time: 04/20/21 10:01 AM  Result Value Ref Range Status   Toxigenic C. Difficile by PCR POSITIVE (A) NEGATIVE Final    Comment: Positive for toxigenic C. difficile with little to no toxin production. Only treat if clinical presentation suggests symptomatic illness. Performed at [facility], [place] [address], [place], [place] [address]     RN Pressure Injury Documentation:     Estimated body mass index is 25.66 kg/m as calculated from the following:   Height as of this encounter: 5' 6"  (1.676 m).   Weight as of this encounter: 72.1 kg.  Malnutrition Type:  Nutrition Problem: Inadequate oral intake Etiology: acute illness  Malnutrition Characteristics:  Signs/Symptoms: per patient/family report  Nutrition Interventions:     Radiology Studies: CT ABDOMEN PELVIS W CONTRAST  Result Date: 04/19/2021 CLINICAL DATA:  Nausea, vomiting, abdominal pain, acute nonlocalized. History of left breast with lumpectomy and radiation. EXAM: CT ABDOMEN AND PELVIS WITH CONTRAST TECHNIQUE: Multidetector CT imaging of the abdomen and pelvis was performed using the standard protocol following bolus administration of intravenous contrast. CONTRAST:  26m OMNIPAQUE IOHEXOL 350 MG/ML SOLN COMPARISON:  CT examination dated November 11, 2019 FINDINGS: Lower chest: Moderate  left and small right pleural effusion with left basilar atelectasis. Small hiatal hernia. Hepatobiliary: At least 3 hyperdense masses in the liver. In the left hepatic lobe measuring 2.2 x 2.2 cm (series 2 image 9). Another hypodense structure abutting the diaphragm measuring 1.4 x 1.6 cm (series 2, image 8). Another hyperdense structure in the inferior aspect of the right hepatic lobe (series 2, image 20). These masses are highly suspicious for metastatic disease until proven otherwise. Gallbladder is unremarkable. No biliary ductal dilatation. Pancreas: Unremarkable. No pancreatic ductal dilatation or surrounding inflammatory changes. Spleen: Normal in size  without focal abnormality. Adrenals/Urinary Tract: Adrenal glands are unremarkable. Kidneys are normal, without renal calculi, focal lesion, or hydronephrosis. Bladder is unremarkable. Stomach/Bowel: Stomach is within normal limits. Small bowel loops are normal in caliber. Postsurgical changes for prior hemicolectomy. No evidence of bowel wall thickening, distention, or inflammatory changes. Vascular/Lymphatic: Aortic atherosclerosis. No enlarged abdominal or pelvic lymph nodes. Reproductive: Status post hysterectomy. No adnexal masses. Other: No abdominal wall hernia or abnormality. No abdominopelvic ascites. Musculoskeletal: Mild multilevel degenerative disc disease. No acute osseous abnormality. IMPRESSION: 1. Multiple hypodense hepatic masses, as detailed above, highly suspicious for metastatic disease. Differential include infectious/inflammatory process. Further evaluation with MRI examination with contrast would be helpful. 2. Moderate left and small right pleural effusion. 3. Postsurgical changes for prior hemicolectomy. No bowel obstruction, colitis or diverticulitis. Electronically Signed   By: Keane Police D.O.   On: 04/19/2021 16:13    Scheduled Meds:  enoxaparin (LOVENOX) injection  40 mg Subcutaneous Q24H   famotidine  20 mg Oral Daily    feeding supplement  1 Container Oral TID BM   FLUoxetine  20 mg Oral Daily   [START ON 04/21/2021] multivitamin with minerals  1 tablet Oral Daily   predniSONE  5 mg Oral Q breakfast   vancomycin  125 mg Oral QID   Continuous Infusions:  lactated ringers 125 mL/hr at 04/20/21 0809    LOS: 1 day   Kerney Elbe, DO [facility] PAGER is on AMION  If 7PM-7AM, please contact night-coverage [email or web address]

## 2021-04-20 NOTE — Consult Note (Signed)
[facility] Referring Provider: [facility] hospitalist Primary Care Physician:  Carollee Herter, Alferd Apa, DO Primary Gastroenterologist:  Dr. Hilarie Fredrickson  Reason for Consultation: Crohn's disease, new liver lesions  HPI:  Sara Barnes is a 80 y.o. female with longstanding, complicated Crohn's disease. She has had severe perianal disease and she suffered cecal perforation in 2019 and underwent right hemicolectomy and end ileostomy. That ileostomy was taken down in June 2020 (elsewhere, op report not available in epic). She is on chronic low dose prednisone and PRN lomotil, imodium for her IBD therapy. Her last routine GI office visit was 3 months ago and she was doing fairly well.    She has history of Breast cancer, diagnosed 2016.  Treated with lumpectomy, XRT.  In 06/2020 she was found to have a metastatic site of breast cancer in skin of left chest after excisional biopsy with negative margins. She followed with her oncology team 07/2020 who recommended a PET scan to check for other sites of metastasis however she declined and has not been back to see them since then.   She was having jaw pain, face pain, headaches several weeks ago.  A neurologist felt it was neuropathic pain and started her on gabapentin 180m 9 pills daily. She says shortly after that she began having worsening diarrhea for weeks, non-bloody.  No abd pains. In the past few days she's had vomiting as well and eventually presented to hospital  [facility] showed NEW masses in her liver and also bilateral pleural effusions.  Her bowel looks fine on CT.   Stool testing has already returned and is Postivie for C diff antigen.  Awaiting C diff by PCR now.  She has not been on any antibiotics in past several months.  Past Medical History:  Diagnosis Date   Allergy    Anemia    Anxiety    Arthritis    Basal cell carcinoma    Blood transfusion without reported diagnosis    Bowel perforation  ([facility]    Cancer of lower-inner quadrant of female breast ([facility] 11/30/2014   left   Cancer of lower-inner quadrant of left female breast ([facility] [place] 88/0/2233  Complication of anesthesia    anxiety post-op after ear surgeries   Crohn's disease of large intestine with abscess ([place]    Dental crowns present    recent root canal 11/2014   Depression    Family history of adverse reaction to anesthesia    pt's sister has hx. of post-op N/V   GERD (gastroesophageal reflux disease)    Hyperlipidemia    Indigestion    Perianal abscess    Personal history of radiation therapy 2016   PONV (postoperative nausea and vomiting)    Runny nose 12/07/2014   clear drainage, per pt.   Seasonal allergies    TMJ (temporomandibular joint syndrome)    Ulcerative colitis    Vitamin B12 deficiency     Past Surgical History:  Procedure Laterality Date   ABDOMINAL HYSTERECTOMY     partial   APPENDECTOMY     AUGMENTATION MAMMAPLASTY Bilateral    BASAL CELL CARCINOMA EXCISION Left    nose   BREAST BIOPSY Left 2019   benign   BREAST ENHANCEMENT SURGERY     BREAST LUMPECTOMY Left 2016   BREAST LUMPECTOMY WITH RADIOACTIVE SEED AND SENTINEL LYMPH NODE BIOPSY Left 12/13/2014   Procedure: LEFT BREAST LUMPECTOMY WITH RADIOACTIVE SEED AND SENTINEL LYMPH NODE BIOPSY;  Surgeon: FStark Klein  MD;  Location: [place];  Service: General;  Laterality: Left;   COLONOSCOPY WITH PROPOFOL  05/28/2011; 06/28/2014   EXPLORATORY LAPAROTOMY  04/08/2018   with end ileostomy takedown and creation of loop ileostomy-- [place] N/A 01/08/2016   Procedure: FLEXIBLE SIGMOIDOSCOPY;  Surgeon: [name], MD;  Location: [facility];  Service: Gastroenterology;  Laterality: N/A;  needs MAC due to pain and anxiety   ILEOSTOMY N/A 05/25/2017   Procedure: DILATION OF ILEOSTOMY;  Surgeon: Rolm Bookbinder, MD;  Location: [facility];  Service: General;   Laterality: N/A;   ILEOSTOMY  02/11/2016   END ileostomy.  Dr Hassell Done   ILEOSTOMY CLOSURE N/A 06/02/2017   Procedure: RESECTION OF ILEAL STRICTURE; ILEOSTOMY REVISION ;  Surgeon: Michael Boston, MD;  Location: [facility];  Service: General;  Laterality: N/A;   ILEOSTOMY DILATION  05/25/2017   DILITATION OF ILEAL STRICTURE   ileostomy stricture resection  06/02/2017   with revision   INCISION AND DRAINAGE ABSCESS  2018   INCISION AND DRAINAGE PERIRECTAL ABSCESS N/A 09/28/2013   Procedure: IRRIGATION AND DEBRIDEMENT PERIANAL ABSCESS, proctoscopy;  Surgeon: Shann Medal, MD;  Location: [facility];  Service: General;  Laterality: N/A;   INCISION AND DRAINAGE PERIRECTAL ABSCESS N/A 01/22/2016   Procedure: IRRIGATION AND DEBRIDEMENT PERIRECTAL ABSCESS;  Surgeon: Armandina Gemma, MD;  Location: [facility];  Service: General;  Laterality: N/A;   INNER EAR SURGERY Bilateral    LAPAROSCOPIC DIVERTED COLOSTOMY N/A 02/11/2016   Procedure: LAPAROSCOPIC DIVERTED ILEOSTOMY;  Surgeon: Johnathan Hausen, MD;  Location: [facility];  Service: General;  Laterality: N/A;   LAPAROSCOPY N/A 03/23/2017   Procedure: LAPAROSCOPY DIAGNOSTIC, EXPLORATORY LAPAROTOMY,  BOWEL RESECTION;  Surgeon: Jackolyn Confer, MD;  Location: [facility];  Service: General;  Laterality: N/A;    Prior to Admission medications   Medication Sig Start Date End Date Taking? Authorizing Provider  acetaminophen (TYLENOL) 500 MG tablet You can take 2 tablets every 6 hours as needed for pain.  Use this as your primary pain control.  Use Tramadol as a last resort.  You can buy this over the counter at any drug store.    DO NOT TAKE MORE THAN 4000 MG OF TYLENOL PER DAY.  IT CAN HARM YOUR LIVER. 03/31/17  Yes Earnstine Regal, PA-C  cyanocobalamin (,VITAMIN B-12,) 1000 MCG/ML injection As directed Patient taking differently: 1,000 mcg every 30 (thirty) days. First Sunday of each month 01/16/21  Yes Pyrtle, Lajuan Lines, MD  diphenoxylate-atropine (LOMOTIL) 2.5-0.025 MG tablet Take 1-2  tablets by mouth 3-4 times daily AS NEEDED for severe diarrhea 01/16/21  Yes Pyrtle, Lajuan Lines, MD  famotidine (PEPCID) 20 MG tablet Take 1 tablet (20 mg total) by mouth 2 (two) times daily. Patient taking differently: Take 20 mg by mouth daily as needed for indigestion. 01/16/21  Yes Pyrtle, Lajuan Lines, MD  FLUoxetine (PROZAC) 20 MG capsule Take 1 capsule (20 mg total) by mouth daily. 01/16/21  Yes Pyrtle, Lajuan Lines, MD  fluticasone (FLONASE) 50 MCG/ACT nasal spray Place 2 sprays into both nostrils daily. Patient taking differently: Place 2 sprays into both nostrils daily as needed for rhinitis. 11/12/20  Yes Roma Schanz R, DO  gabapentin (NEURONTIN) 100 MG capsule Take 3 capsules (300 mg total) by mouth 3 (three) times daily. Patient taking differently: Take 300 mg by mouth at bedtime as needed (for neuropathy). 11/15/20  Yes Marcial Pacas, MD  loperamide (IMODIUM) 2 MG capsule Take 1  capsule (2 mg total) by mouth as needed for diarrhea or loose stools. 11/02/19  Yes Pyrtle, Lajuan Lines, MD  ondansetron (ZOFRAN-ODT) 4 MG disintegrating tablet DISSOLVE ONE TABLET BY MOUTH EVERY 6 HOURS AS NEEDED FOR NAUSEA AND VOMITING 01/16/21  Yes Pyrtle, Lajuan Lines, MD  predniSONE (DELTASONE) 5 MG tablet TAKE 1 TABLET(5 MG) BY MOUTH DAILY 01/16/21  Yes Pyrtle, Lajuan Lines, MD  zolpidem (AMBIEN) 10 MG tablet Take 1 tablet (10 mg total) by mouth at bedtime as needed for sleep. 04/15/21 07/14/21 Yes Pyrtle, Lajuan Lines, MD    Current Facility-Administered Medications  Medication Dose Route Frequency Provider Last Rate Last Admin   acetaminophen (TYLENOL) tablet 650 mg  650 mg Oral Q6H PRN Doran Heater, DO       Or   acetaminophen (TYLENOL) suppository 650 mg  650 mg Rectal Q6H PRN MacNeil, Richard G, DO       albuterol (PROVENTIL) (2.5 MG/3ML) 0.083% nebulizer solution 2.5 mg  2.5 mg Nebulization Q6H PRN Doran Heater, DO       diphenoxylate-atropine (LOMOTIL) 2.5-0.025 MG per tablet 1 tablet  1 tablet Oral QID PRN Doran Heater, DO   1  tablet at 04/20/21 1016   enoxaparin (LOVENOX) injection 40 mg  40 mg Subcutaneous Q24H MacNeil, Richard G, DO   40 mg at 04/20/21 0806   famotidine (PEPCID) tablet 20 mg  20 mg Oral Daily MacNeil, Richard G, DO       feeding supplement (BOOST / RESOURCE BREEZE) liquid 1 Container  1 Container Oral TID BM Sheikh, Omair Latif, DO       FLUoxetine (PROZAC) capsule 20 mg  20 mg Oral Daily Doran Heater, DO   20 mg at 04/20/21 0806   fluticasone (FLONASE) 50 MCG/ACT nasal spray 2 spray  2 spray Each Nare Daily PRN Doran Heater, DO       lactated ringers infusion   Intravenous Continuous Doran Heater, DO 125 mL/hr at 04/20/21 0809 New Bag at 04/20/21 0809   [START ON 04/21/2021] multivitamin with minerals tablet 1 tablet  1 tablet Oral Daily Sheikh, Omair Latif, DO       ondansetron [facility]) tablet 4 mg  4 mg Oral Q6H PRN Doran Heater, DO   4 mg at 04/20/21 1016   Or   ondansetron (ZOFRAN) injection 4 mg  4 mg Intravenous Q6H PRN Doran Heater, DO       predniSONE (DELTASONE) tablet 5 mg  5 mg Oral Q breakfast Doran Heater, DO   5 mg at 04/20/21 1740   zolpidem (AMBIEN) tablet 10 mg  10 mg Oral QHS PRN Doran Heater, DO   10 mg at 04/19/21 2225    Allergies as of 04/19/2021 - Review Complete 04/19/2021  Allergen Reaction Noted   Nsaids Other (See Comments) 06/02/2017   Codeine Nausea And Vomiting 01/20/2008   Mesalamine Nausea And Vomiting 05/13/2011   Oxycodone Itching and Rash 06/01/2017   Norco [hydrocodone-acetaminophen] Nausea Only 06/05/2017   Other Itching 12/07/2014   Sulfa antibiotics Rash 12/07/2014    Family History  Problem Relation Age of Onset   Prostate cancer Brother 20   Kidney disease Brother    Diabetes Brother    Brain cancer Father    Rheum arthritis Mother    Anesthesia problems Sister        post-op N/V   Emphysema Maternal Grandfather    Asthma Sister    Rheum arthritis Brother  Esophageal cancer Neg Hx    Breast  cancer Neg Hx    Rectal cancer Neg Hx    Stomach cancer Neg Hx    Colon cancer Neg Hx     Social History   Socioeconomic History   Marital status: Widowed    Spouse name: Not on file   Number of children: 1   Years of education: Not on file   Highest education level: Not on file  Occupational History   Occupation: Warden/ranger    Comment: retired at age 41  Tobacco Use   Smoking status: Never   Smokeless tobacco: Never  Vaping Use   Vaping Use: Never used  Substance and Sexual Activity   Alcohol use: Yes    Alcohol/week: 7.0 standard drinks    Types: 7 Glasses of wine per week    Comment: wine daily   Drug use: No   Sexual activity: Not Currently    Partners: Male  Other Topics Concern   Not on file  Social History Narrative   Lives alone in own home   Right Handed    Drinks caffeine occasionally   Social Determinants of Health   Financial Resource Strain: Low Risk    Difficulty of Paying Living Expenses: Not hard at all  Food Insecurity: No Food Insecurity   Worried About Charity fundraiser in the Last Year: Never true   [place] in the Last Year: Never true  Transportation Needs: No Transportation Needs   Lack of Transportation (Medical): No   Lack of Transportation (Non-Medical): No  Physical Activity: Sufficiently Active   Days of Exercise per Week: 7 days   Minutes of Exercise per Session: 30 min  Stress: No Stress Concern Present   Feeling of Stress : Not at all  Social Connections: Moderately Isolated   Frequency of Communication with Friends and Family: More than three times a week   Frequency of Social Gatherings with Friends and Family: Once a week   Attends Religious Services: More than 4 times per year   Active Member of [employer] or Organizations: No   Attends Archivist Meetings: Never   Marital Status: Widowed  Human resources officer Violence: Not At Risk   Fear of Current or Ex-Partner: No   Emotionally Abused: No   Physically  Abused: No   Sexually Abused: No    Review of Systems: Pertinent positive and negative review of systems were noted in the above HPI section. Complete review of systems was performed and was otherwise normal.  Physical Exam: Vital signs in last 24 hours: Temp:  [97.6 F (36.4 C)-98.2 F (36.8 C)] 97.6 F (36.4 C) (12/24 0950) Pulse Rate:  [65-82] 80 (12/24 0950) Resp:  [15-20] 20 (12/24 0950) BP: (115-167)/(74-95) 115/76 (12/24 0950) SpO2:  [95 %-100 %] 96 % (12/24 0950) Weight:  [72.1 kg] 72.1 kg (12/23 2130) Last BM Date: 04/19/21 Constitutional: generally well-appearing Psychiatric: alert and oriented x3 Eyes: extraocular movements intact Mouth: oral pharynx moist, no lesions Neck: supple no lymphadenopathy Cardiovascular: heart regular rate and rhythm Lungs: clear to auscultation bilaterally Abdomen: soft, nontender, nondistended, no obvious ascites, no peritoneal signs, normal bowel sounds Extremities: no lower extremity edema bilaterally Skin: no lesions on visible extremities  Lab Results: Recent Labs    04/19/21 1143 04/20/21 0439  WBC 8.7 6.2  HGB 15.7* 12.8  HCT 45.5 38.2  PLT 279 213  MCV 92.7 93.6   BMET Recent Labs    04/19/21 1143  04/20/21 0439  NA 130* 134*  K 3.9 3.8  CL 99 104  CO2 22 23  GLUCOSE 113* 85  BUN 10 7*  CREATININE 0.89 0.76  CALCIUM 9.0 8.4*   LFT Recent Labs    04/20/21 0439  BILITOT 0.7  AST 18  ALT 14  ALKPHOS 66  PROT 5.3*  ALBUMIN 2.8*   Imaging/Other Results: CT ABDOMEN PELVIS W CONTRAST  Result Date: 04/19/2021 CLINICAL DATA:  Nausea, vomiting, abdominal pain, acute nonlocalized. History of left breast with lumpectomy and radiation. EXAM: CT ABDOMEN AND PELVIS WITH CONTRAST TECHNIQUE: Multidetector CT imaging of the abdomen and pelvis was performed using the standard protocol following bolus administration of intravenous contrast. CONTRAST:  27m OMNIPAQUE IOHEXOL 350 MG/ML SOLN COMPARISON:  CT examination  dated November 11, 2019 FINDINGS: Lower chest: Moderate left and small right pleural effusion with left basilar atelectasis. Small hiatal hernia. Hepatobiliary: At least 3 hyperdense masses in the liver. In the left hepatic lobe measuring 2.2 x 2.2 cm (series 2 image 9). Another hypodense structure abutting the diaphragm measuring 1.4 x 1.6 cm (series 2, image 8). Another hyperdense structure in the inferior aspect of the right hepatic lobe (series 2, image 20). These masses are highly suspicious for metastatic disease until proven otherwise. Gallbladder is unremarkable. No biliary ductal dilatation. Pancreas: Unremarkable. No pancreatic ductal dilatation or surrounding inflammatory changes. Spleen: Normal in size without focal abnormality. Adrenals/Urinary Tract: Adrenal glands are unremarkable. Kidneys are normal, without renal calculi, focal lesion, or hydronephrosis. Bladder is unremarkable. Stomach/Bowel: Stomach is within normal limits. Small bowel loops are normal in caliber. Postsurgical changes for prior hemicolectomy. No evidence of bowel wall thickening, distention, or inflammatory changes. Vascular/Lymphatic: Aortic atherosclerosis. No enlarged abdominal or pelvic lymph nodes. Reproductive: Status post hysterectomy. No adnexal masses. Other: No abdominal wall hernia or abnormality. No abdominopelvic ascites. Musculoskeletal: Mild multilevel degenerative disc disease. No acute osseous abnormality. IMPRESSION: 1. Multiple hypodense hepatic masses, as detailed above, highly suspicious for metastatic disease. Differential include infectious/inflammatory process. Further evaluation with MRI examination with contrast would be helpful. 2. Moderate left and small right pleural effusion. 3. Postsurgical changes for prior hemicolectomy. No bowel obstruction, colitis or diverticulitis. Electronically Signed   By: IKeane PoliceD.O.   On: 04/19/2021 16:13    Impression/Plan: 80y.o. female with longstanding Crohn's  disease, now with diarrhea, vomiting.  Not sure if this is really a flare of her IBD. Her CT scan shows no inflamed bowel and C. Diff screen was positive. Await further, confirmatory C. Diff testing results. For now, I am putting her on her usual IBD meds (prednisone 550monce daily, and scheduled imodium, scheduled lomotil).  Incidentally noted to have masses in her liver.  I recommend IR percutaneous biopsy of one of these masses. She was proven to have metastatic breast Ca several months ago (met to chest wall, excised) and declined PET scan.  Certainly the masses in liver may be from Breast cancer spread.    We will follow along.  DaMilus BanisterMD  04/20/2021, 12:01 PM [facility] Pager [phone]

## 2021-04-20 NOTE — Progress Notes (Signed)
Initial Nutrition Assessment  DOCUMENTATION CODES:   Not applicable  INTERVENTION:   Boost Breeze po TID, each supplement provides 250 kcal and 9 grams of protein  MVI po daily   Pt at high refeed risk; recommend monitor potassium, magnesium and phosphorus labs daily until stable  Pt at high risk for nutrient deficiencies r/t her h/o UC and numerous GI surgeries. Recommend check thiamine, B12, folate, iron, calcium, zinc, copper and vitamins D, A, E, & K yearly.   NUTRITION DIAGNOSIS:   Inadequate oral intake related to acute illness as evidenced by per patient/family report.  GOAL:   Patient will meet greater than or equal to 90% of their needs  MONITOR:   PO intake, Supplement acceptance, Labs, Weight trends, Skin, I & O's  REASON FOR ASSESSMENT:   Malnutrition Screening Tool    ASSESSMENT:   80 y/o female with h/o breast cancer s/p lumpectomy and XRT, depression, GERD, HLD, CHF and Crohn's disease with ulcerative colitis (biopsy diagnosed since 1997) s/p multiple I & Ds for perirectal abscesss, s/p lap end ileostomy 01/2016, s/p right colectomy 02/2017 (with removal of 5cm terminal ileum), s/p dilation of ileostomy 04/2017, s/p ileostomy revision 05/2017 (with removal of 4.5cm small bowel), s/p ileostomy [place] and loop ileostomy 12/19 and s/p closure of loop ileostomy 09/2018 who is admitted with intractable nausea, vomiting and diarrhea.  RD working remotely.  Spoke with pt's sister via phone as pt is HOH. Sister is able to translate back and forth between RD and pt. Pt reports decreased appetite and oral intake for the past 2 weeks r/t diarrhea and nausea but reports that her appetite was poor 3 days pta r/t vomiting. Pt reports that she has been unable to keep any food down until this morning. Pt reports that this morning, she ate some grits and toast but that it did upset her stomach afterwards; pt is documented to have eaten 90% of her breakfast. RD discussed with pt  the importance of adequate nutrition needed to preserve lean muscle. Pt reports that she is unable to drink Ensure or Boost as it gives her diarrhea. Pt is willing to try peach Boost Breeze. Of note, pt reports that she is lactose intolerant. RD will add supplements and MVI to help pt meet her estimated needs. Pt is at refeed risk. Per chart, pt appears weight stable at baseline. RD will obtain nutrition related physical exam at follow-up.   Medications reviewed and include: lovenox, pepcid, prednisone, LRS @125ml /hr   Labs reviewed: Na 134(L), P 3.4 wnl, Mg 1.8 wnl  NUTRITION - FOCUSED PHYSICAL EXAM: Unable to obtain at this time   Diet Order:   Diet Order             Diet regular Room service appropriate? Yes; Fluid consistency: Thin  Diet effective now                  EDUCATION NEEDS:   Education needs have been addressed  Skin:  Skin Assessment: Reviewed RN Assessment  Last BM:  12/24- type 7  Height:   Ht Readings from Last 1 Encounters:  04/19/21 5' 6"  (1.676 m)    Weight:   Wt Readings from Last 1 Encounters:  04/19/21 72.1 kg    Ideal Body Weight:  59 kg  BMI:  Body mass index is 25.66 kg/m.  Estimated Nutritional Needs:   Kcal:  1700-1900kcal/day  Protein:  85-95g/day  Fluid:  1.5-1.8L/day  Koleen Distance MS, RD, LDN Please refer to  [facility] for RD and/or RD on-call/weekend/after hours pager

## 2021-04-20 NOTE — Progress Notes (Signed)
Laboratory Interpretation Guideline   GDH antigen  C.diff toxin  Toxigenic C.diff PCR Interpretation   + +   Toxin producing C.diff detected  - -   No C.diff detected  + - - Patient is colonized with non-toxigenic C.diff. Consider withholding treatment if symptoms are mild  + - + Toxigenic C.diff with little to no toxin production. Only treat for symptomatic illness  - + + Toxin producing C.diff detected  - + - Testing not consistent with Cdiff infection   Signs/symptoms that may be present: Watery diarrhea (3 or more stools per day); temperature >38C (100.38F); abdominal pain, cramping, tenderness or distention (may indicate ileus or toxic megacolon); leukocytosis.  Reminders: * No other meds associated with diarrhea * No other antibiotics    Plan: Vancomycin 125 mg PO 4 times daily per MD Pharmacy to sign off.    Gretta Arab PharmD, BCPS Clinical Pharmacist [facility] [phone] 04/20/2021 2:15 PM

## 2021-04-21 DIAGNOSIS — E876 Hypokalemia: Secondary | ICD-10-CM

## 2021-04-21 DIAGNOSIS — A0472 Enterocolitis due to Clostridium difficile, not specified as recurrent: Secondary | ICD-10-CM

## 2021-04-21 LAB — CBC WITH DIFFERENTIAL/PLATELET
Abs Immature Granulocytes: 0.02 10*3/uL (ref 0.00–0.07)
Basophils Absolute: 0 10*3/uL (ref 0.0–0.1)
Basophils Relative: 1 %
Eosinophils Absolute: 0.1 10*3/uL (ref 0.0–0.5)
Eosinophils Relative: 1 %
HCT: 38 % (ref 36.0–46.0)
Hemoglobin: 13 g/dL (ref 12.0–15.0)
Immature Granulocytes: 0 %
Lymphocytes Relative: 22 %
Lymphs Abs: 1.4 10*3/uL (ref 0.7–4.0)
MCH: 32 pg (ref 26.0–34.0)
MCHC: 34.2 g/dL (ref 30.0–36.0)
MCV: 93.6 fL (ref 80.0–100.0)
Monocytes Absolute: 0.6 10*3/uL (ref 0.1–1.0)
Monocytes Relative: 10 %
Neutro Abs: 4.2 10*3/uL (ref 1.7–7.7)
Neutrophils Relative %: 66 %
Platelets: 213 10*3/uL (ref 150–400)
RBC: 4.06 MIL/uL (ref 3.87–5.11)
RDW: 12 % (ref 11.5–15.5)
WBC: 6.4 10*3/uL (ref 4.0–10.5)
nRBC: 0 % (ref 0.0–0.2)

## 2021-04-21 LAB — COMPREHENSIVE METABOLIC PANEL
ALT: 13 U/L (ref 0–44)
AST: 16 U/L (ref 15–41)
Albumin: 2.9 g/dL — ABNORMAL LOW (ref 3.5–5.0)
Alkaline Phosphatase: 70 U/L (ref 38–126)
Anion gap: 6 (ref 5–15)
BUN: 8 mg/dL (ref 8–23)
CO2: 25 mmol/L (ref 22–32)
Calcium: 8.5 mg/dL — ABNORMAL LOW (ref 8.9–10.3)
Chloride: 103 mmol/L (ref 98–111)
Creatinine, Ser: 0.73 mg/dL (ref 0.44–1.00)
GFR, Estimated: >60 mL/min (ref >60)
Glucose, Bld: 92 mg/dL (ref 70–99)
Potassium: 3.4 mmol/L — ABNORMAL LOW (ref 3.5–5.1)
Sodium: 134 mmol/L — ABNORMAL LOW (ref 135–145)
Total Bilirubin: 0.5 mg/dL (ref 0.3–1.2)
Total Protein: 5.4 g/dL — ABNORMAL LOW (ref 6.5–8.1)

## 2021-04-21 LAB — PHOSPHORUS: Phosphorus: 3.3 mg/dL (ref 2.5–4.6)

## 2021-04-21 LAB — MAGNESIUM: Magnesium: 1.9 mg/dL (ref 1.7–2.4)

## 2021-04-21 LAB — C-REACTIVE PROTEIN: CRP: 0.8 mg/dL (ref <1.0)

## 2021-04-21 LAB — SEDIMENTATION RATE: Sed Rate: 5 mm/hr (ref 0–22)

## 2021-04-21 MED ORDER — GERHARDT'S BUTT CREAM
TOPICAL_CREAM | CUTANEOUS | Status: DC | PRN
  Filled 2021-04-21 (×3): qty 1

## 2021-04-21 MED ORDER — MAGNESIUM SULFATE 2 GM/50ML IV SOLN
2.0000 g | Freq: Once | INTRAVENOUS | Status: AC
  Administered 2021-04-21: 09:00:00 2 g via INTRAVENOUS
  Filled 2021-04-21: qty 50

## 2021-04-21 MED ORDER — POTASSIUM CHLORIDE CRYS ER 20 MEQ PO TBCR
40.0000 meq | EXTENDED_RELEASE_TABLET | Freq: Two times a day (BID) | ORAL | Status: AC
  Administered 2021-04-21 (×2): 40 meq via ORAL
  Filled 2021-04-21 (×2): qty 2

## 2021-04-21 MED ORDER — TRAMADOL HCL 50 MG PO TABS
50.0000 mg | ORAL_TABLET | Freq: Once | ORAL | Status: AC
  Administered 2021-04-21: 01:00:00 50 mg via ORAL
  Filled 2021-04-21: qty 1

## 2021-04-21 MED ORDER — HYDROMORPHONE HCL 1 MG/ML IJ SOLN
0.5000 mg | INTRAMUSCULAR | Status: DC | PRN
  Administered 2021-04-21 – 2021-04-24 (×14): 0.5 mg via INTRAVENOUS
  Filled 2021-04-21 (×16): qty 0.5

## 2021-04-21 NOTE — Consult Note (Signed)
Chief Complaint: Patient was seen in consultation today for liver lesion bx Chief Complaint  Patient presents with   Emesis   Diarrhea   Dysuria   Back Pain   at the request of Raiford Noble   Referring Physician(s): Raiford Noble   Supervising Physician: Corrie Mckusick  Patient Status: [name] - In-pt  History of Present Illness: Sara Barnes is a 80 y.o. female with PMHs of HLD, Crohn's disease, and recurrent left breat CA original diagnosis in 2016 s/p lumpectomy, radiation and systemic therapy in 2016, and who underwent excisional biopsy of left chest skin rash which revealed recurrent breast CA. Deep and lateral margins were negative for malignancy. PET can was ordered in April 2022 but patient declined.  Mammogram on 01/14/21 showed no mammographic evidence of malignancy.  She developed nausea, increased bowel movement, and loss of appetite in the beginning of December which lasted about  two weeks, she ultimately  presented to ED on 04/19/21 due to intractable nausea, vomiting, diarrhea. She underwent CT AP w in ED which showed:   1. Multiple hypodense hepatic masses, as detailed above, highly suspicious for metastatic disease. Differential include infectious/inflammatory process. Further evaluation with MRI examination with contrast would be helpful.  2. Moderate left and small right pleural effusion. 3. Postsurgical changes for prior hemicolectomy. No bowel obstruction, colitis or diverticulitis.  Patient was also tested positive for C-diff.  She was hospitalized for further evaluation and management. GI was consulted, who recommended liver lesion bx.   IR was requested for liver lesion bx, case was reviewed and approved for US guided liver lesion bx by Dr. Pascal Lux.   Patient laying in a chair, not in acute distress.  Reports feeling sick today with headache and nausea. Reports she has shortness of breath today but it is a lot better than the time she came into the  hospital. Able to speak full sentence with no problem. Denise fever, chills, shortness of breath, cough, chest pain, abdominal pain, nausea ,vomiting, and bleeding.   Past Medical History:  Diagnosis Date   Allergy    Anemia    Anxiety    Arthritis    Basal cell carcinoma    Blood transfusion without reported diagnosis    Bowel perforation ([place])    Cancer of lower-inner quadrant of female breast ([place]) 11/30/2014   left   Cancer of lower-inner quadrant of left female breast ([place]) 6/0/1093   Complication of anesthesia    anxiety post-op after ear surgeries   Crohn's disease of large intestine with abscess ([place])    Dental crowns present    recent root canal 11/2014   Depression    Family history of adverse reaction to anesthesia    pt's sister has hx. of post-op N/V   GERD (gastroesophageal reflux disease)    Hyperlipidemia    Indigestion    Perianal abscess    Personal history of radiation therapy 2016   PONV (postoperative nausea and vomiting)    Runny nose 12/07/2014   clear drainage, per pt.   Seasonal allergies    TMJ (temporomandibular joint syndrome)    Ulcerative colitis    Vitamin B12 deficiency     Past Surgical History:  Procedure Laterality Date   ABDOMINAL HYSTERECTOMY     partial   APPENDECTOMY     AUGMENTATION MAMMAPLASTY Bilateral    BASAL CELL CARCINOMA EXCISION Left    nose   BREAST BIOPSY Left 2019   benign   BREAST ENHANCEMENT SURGERY  BREAST LUMPECTOMY Left 2016   BREAST LUMPECTOMY WITH RADIOACTIVE SEED AND SENTINEL LYMPH NODE BIOPSY Left 12/13/2014   Procedure: LEFT BREAST LUMPECTOMY WITH RADIOACTIVE SEED AND SENTINEL LYMPH NODE BIOPSY;  Surgeon: [name], MD;  Location: [place];  Service: General;  Laterality: Left;   COLONOSCOPY WITH PROPOFOL  05/28/2011; 06/28/2014   EXPLORATORY LAPAROTOMY  04/08/2018   with end ileostomy takedown and creation of loop ileostomy-- [place] N/A 01/08/2016   Procedure: Beryle Quant;  Surgeon: [name], MD;  Location: [facility];  Service: Gastroenterology;  Laterality: N/A;  needs MAC due to pain and anxiety   ILEOSTOMY N/A 05/25/2017   Procedure: DILATION OF ILEOSTOMY;  Surgeon: Rolm Bookbinder, MD;  Location: [facility];  Service: General;  Laterality: N/A;   ILEOSTOMY  02/11/2016   END ileostomy.  Dr Hassell Done   ILEOSTOMY CLOSURE N/A 06/02/2017   Procedure: RESECTION OF ILEAL STRICTURE; ILEOSTOMY REVISION ;  Surgeon: Michael Boston, MD;  Location: [facility];  Service: General;  Laterality: N/A;   ILEOSTOMY DILATION  05/25/2017   DILITATION OF ILEAL STRICTURE   ileostomy stricture resection  06/02/2017   with revision   INCISION AND DRAINAGE ABSCESS  2018   INCISION AND DRAINAGE PERIRECTAL ABSCESS N/A 09/28/2013   Procedure: IRRIGATION AND DEBRIDEMENT PERIANAL ABSCESS, proctoscopy;  Surgeon: Shann Medal, MD;  Location: [facility];  Service: General;  Laterality: N/A;   INCISION AND DRAINAGE PERIRECTAL ABSCESS N/A 01/22/2016   Procedure: IRRIGATION AND DEBRIDEMENT PERIRECTAL ABSCESS;  Surgeon: Armandina Gemma, MD;  Location: [facility];  Service: General;  Laterality: N/A;   INNER EAR SURGERY Bilateral    LAPAROSCOPIC DIVERTED COLOSTOMY N/A 02/11/2016   Procedure: LAPAROSCOPIC DIVERTED ILEOSTOMY;  Surgeon: Johnathan Hausen, MD;  Location: [facility];  Service: General;  Laterality: N/A;   LAPAROSCOPY N/A 03/23/2017   Procedure: LAPAROSCOPY DIAGNOSTIC, EXPLORATORY LAPAROTOMY,  BOWEL RESECTION;  Surgeon: Jackolyn Confer, MD;  Location: [facility];  Service: General;  Laterality: N/A;    Allergies: Nsaids, Codeine, Mesalamine, Oxycodone, Lactose intolerance (gi), Norco [hydrocodone-acetaminophen], Other, and Sulfa antibiotics  Medications: Prior to Admission medications   Medication Sig Start Date End Date Taking? Authorizing Provider  acetaminophen (TYLENOL) 500 MG tablet You can take 2 tablets every 6 hours as needed  for pain.  Use this as your primary pain control.  Use Tramadol as a last resort.  You can buy this over the counter at any drug store.    DO NOT TAKE MORE THAN 4000 MG OF TYLENOL PER DAY.  IT CAN HARM YOUR LIVER. 03/31/17  Yes Earnstine Regal, PA-C  cyanocobalamin (,VITAMIN B-12,) 1000 MCG/ML injection As directed Patient taking differently: 1,000 mcg every 30 (thirty) days. First Sunday of each month 01/16/21  Yes Pyrtle, Lajuan Lines, MD  diphenoxylate-atropine (LOMOTIL) 2.5-0.025 MG tablet Take 1-2 tablets by mouth 3-4 times daily AS NEEDED for severe diarrhea 01/16/21  Yes Pyrtle, Lajuan Lines, MD  famotidine (PEPCID) 20 MG tablet Take 1 tablet (20 mg total) by mouth 2 (two) times daily. Patient taking differently: Take 20 mg by mouth daily as needed for indigestion. 01/16/21  Yes Pyrtle, Lajuan Lines, MD  FLUoxetine (PROZAC) 20 MG capsule Take 1 capsule (20 mg total) by mouth daily. 01/16/21  Yes Pyrtle, Lajuan Lines, MD  fluticasone (FLONASE) 50 MCG/ACT nasal spray Place 2 sprays into both nostrils daily. Patient taking differently: Place 2 sprays into both nostrils daily as needed for rhinitis. 11/12/20  Yes Carollee Herter, Yvonne R, DO  gabapentin (NEURONTIN) 100 MG capsule Take 3 capsules (300 mg total) by mouth 3 (three) times daily. Patient taking differently: Take 300 mg by mouth at bedtime as needed (for neuropathy). 11/15/20  Yes Marcial Pacas, MD  loperamide (IMODIUM) 2 MG capsule Take 1 capsule (2 mg total) by mouth as needed for diarrhea or loose stools. 11/02/19  Yes Pyrtle, Lajuan Lines, MD  ondansetron (ZOFRAN-ODT) 4 MG disintegrating tablet DISSOLVE ONE TABLET BY MOUTH EVERY 6 HOURS AS NEEDED FOR NAUSEA AND VOMITING 01/16/21  Yes Pyrtle, Lajuan Lines, MD  predniSONE (DELTASONE) 5 MG tablet TAKE 1 TABLET(5 MG) BY MOUTH DAILY 01/16/21  Yes Pyrtle, Lajuan Lines, MD  zolpidem (AMBIEN) 10 MG tablet Take 1 tablet (10 mg total) by mouth at bedtime as needed for sleep. 04/15/21 07/14/21 Yes Pyrtle, Lajuan Lines, MD     Family History  Problem Relation  Age of Onset   Prostate cancer Brother 45   Kidney disease Brother    Diabetes Brother    Brain cancer Father    Rheum arthritis Mother    Anesthesia problems Sister        post-op N/V   Emphysema Maternal Grandfather    Asthma Sister    Rheum arthritis Brother    Esophageal cancer Neg Hx    Breast cancer Neg Hx    Rectal cancer Neg Hx    Stomach cancer Neg Hx    Colon cancer Neg Hx     Social History   Socioeconomic History   Marital status: Widowed    Spouse name: Not on file   Number of children: 1   Years of education: Not on file   Highest education level: Not on file  Occupational History   Occupation: Warden/ranger    Comment: retired at age 72  Tobacco Use   Smoking status: Never   Smokeless tobacco: Never  Vaping Use   Vaping Use: Never used  Substance and Sexual Activity   Alcohol use: Yes    Alcohol/week: 7.0 standard drinks    Types: 7 Glasses of wine per week    Comment: wine daily   Drug use: No   Sexual activity: Not Currently    Partners: Male  Other Topics Concern   Not on file  Social History Narrative   Lives alone in own home   Right Handed    Drinks caffeine occasionally   Social Determinants of Health   Financial Resource Strain: Low Risk    Difficulty of Paying Living Expenses: Not hard at all  Food Insecurity: No Food Insecurity   Worried About Charity fundraiser in the Last Year: Never true   [place] in the Last Year: Never true  Transportation Needs: No Transportation Needs   Lack of Transportation (Medical): No   Lack of Transportation (Non-Medical): No  Physical Activity: Sufficiently Active   Days of Exercise per Week: 7 days   Minutes of Exercise per Session: 30 min  Stress: No Stress Concern Present   Feeling of Stress : Not at all  Social Connections: Moderately Isolated   Frequency of Communication with Friends and Family: More than three times a week   Frequency of Social Gatherings with Friends and Family:  Once a week   Attends Religious Services: More than 4 times per year   Active Member of [employer] or Organizations: No   Attends Archivist Meetings: Never   Marital Status: Widowed  Review of Systems: A 12 point ROS discussed and pertinent positives are indicated in the HPI above.  All other systems are negative.  Vital Signs: BP 138/72 (BP Location: Right Arm)    Pulse 73    Temp 98.5 F (36.9 C)    Resp 18    Ht _0  (1.676 m)    Wt 158 lb 15.2 oz (72.1 kg)    SpO2 95%    BMI 25.66 kg/m    Physical Exam Vitals reviewed.  Constitutional:      General: She is not in acute distress.    Appearance: Normal appearance.     Comments: Lethargic   HENT:     Head: Normocephalic and atraumatic.     Mouth/Throat:     Mouth: Mucous membranes are moist.  Cardiovascular:     Rate and Rhythm: Normal rate and regular rhythm.     Pulses: Normal pulses.     Heart sounds: Normal heart sounds.  Pulmonary:     Effort: Pulmonary effort is normal.     Breath sounds: Normal breath sounds.  Abdominal:     General: Abdomen is flat. Bowel sounds are normal.     Palpations: Abdomen is soft.  Skin:    General: Skin is warm and dry.     Coloration: Skin is not jaundiced or pale.  Neurological:     Mental Status: She is alert and oriented to person, place, and time.  Psychiatric:        Mood and Affect: Mood normal.        Behavior: Behavior normal.        Judgment: Judgment normal.    MD Evaluation Airway: WNL Heart: WNL Abdomen: WNL Chest/ Lungs: WNL ASA  Classification: 3 Mallampati/Airway Score: Two  Imaging: DG Chest 1 View  Result Date: 04/22/2021 CLINICAL DATA:  80 year old female with shortness of breath. EXAM: CHEST  1 VIEW COMPARISON:  Chest radiograph 10/30/2016 and earlier. CT Abdomen and Pelvis 04/19/2021. FINDINGS: Portable AP semi upright view at 0607 hours. Some artifact due to bilateral breast implants, but also veiling opacity at the left lung base in keeping  with ongoing left pleural effusion demonstrated recently on CT Abdomen and Pelvis. No superimposed pneumothorax or pulmonary edema. No air bronchograms. Allowing for portable technique mediastinal contours appear stable since 2018. Visualized tracheal air column is within normal limits. Chronic left chest wall surgical clips. No acute osseous abnormality identified. IMPRESSION: 1. Ongoing small to moderate left pleural effusion seen recently on CT Abdomen and Pelvis. 2. Artifact from breast implants. No other acute cardiopulmonary abnormality. Electronically Signed   By: Genevie Ann M.D.   On: 04/22/2021 06:51   CT ABDOMEN PELVIS W CONTRAST  Result Date: 04/19/2021 CLINICAL DATA:  Nausea, vomiting, abdominal pain, acute nonlocalized. History of left breast with lumpectomy and radiation. EXAM: CT ABDOMEN AND PELVIS WITH CONTRAST TECHNIQUE: Multidetector CT imaging of the abdomen and pelvis was performed using the standard protocol following bolus administration of intravenous contrast. CONTRAST:  67m OMNIPAQUE IOHEXOL 350 MG/ML SOLN COMPARISON:  CT examination dated November 11, 2019 FINDINGS: Lower chest: Moderate left and small right pleural effusion with left basilar atelectasis. Small hiatal hernia. Hepatobiliary: At least 3 hyperdense masses in the liver. In the left hepatic lobe measuring 2.2 x 2.2 cm (series 2 image 9). Another hypodense structure abutting the diaphragm measuring 1.4 x 1.6 cm (series 2, image 8). Another hyperdense structure in the inferior aspect of the right hepatic lobe (series 2, image  20). These masses are highly suspicious for metastatic disease until proven otherwise. Gallbladder is unremarkable. No biliary ductal dilatation. Pancreas: Unremarkable. No pancreatic ductal dilatation or surrounding inflammatory changes. Spleen: Normal in size without focal abnormality. Adrenals/Urinary Tract: Adrenal glands are unremarkable. Kidneys are normal, without renal calculi, focal lesion, or  hydronephrosis. Bladder is unremarkable. Stomach/Bowel: Stomach is within normal limits. Small bowel loops are normal in caliber. Postsurgical changes for prior hemicolectomy. No evidence of bowel wall thickening, distention, or inflammatory changes. Vascular/Lymphatic: Aortic atherosclerosis. No enlarged abdominal or pelvic lymph nodes. Reproductive: Status post hysterectomy. No adnexal masses. Other: No abdominal wall hernia or abnormality. No abdominopelvic ascites. Musculoskeletal: Mild multilevel degenerative disc disease. No acute osseous abnormality. IMPRESSION: 1. Multiple hypodense hepatic masses, as detailed above, highly suspicious for metastatic disease. Differential include infectious/inflammatory process. Further evaluation with MRI examination with contrast would be helpful. 2. Moderate left and small right pleural effusion. 3. Postsurgical changes for prior hemicolectomy. No bowel obstruction, colitis or diverticulitis. Electronically Signed   By: Keane Police D.O.   On: 04/19/2021 16:13   DG CHEST PORT 1 VIEW  Result Date: 04/23/2021 CLINICAL DATA:  Shortness of breath EXAM: PORTABLE CHEST 1 VIEW COMPARISON:  04/22/2021 FINDINGS: Transverse diameter of heart is increased. There is diffuse haziness in the left mid and left lower lung fields. This may be due to moderate left pleural effusion. Evaluation of left mid and left lower lung fields for infiltrates is limited by the effusion. Right lung is clear. Right lateral CP angle is clear. There is no pneumothorax. IMPRESSION: No significant interval changes are noted in the homogeneous opacity in the left mid and left lower lung fields suggesting moderate left pleural effusion. Possibility of underlying atelectasis/pneumonitis in the left mid and left lower lung fields is not excluded. Electronically Signed   By: Elmer Picker M.D.   On: 04/23/2021 10:03    Labs:  CBC: Recent Labs    04/20/21 0439 04/21/21 0458 04/22/21 0407  04/23/21 0415  WBC 6.2 6.4 8.5 7.6  HGB 12.8 13.0 12.8 13.4  HCT 38.2 38.0 38.4 40.2  PLT 213 213 200 229    COAGS: Recent Labs    04/23/21 0415  INR 1.0    BMP: Recent Labs    04/20/21 0439 04/21/21 0458 04/22/21 0407 04/23/21 0415  NA 134* 134* 133* 133*  K 3.8 3.4* 4.4 3.6  CL 104 103 102 99  CO2 _0 GLUCOSE 85 92 90 96  BUN 7* _1 CALCIUM 8.4* 8.5* 8.6* 8.6*  CREATININE 0.76 0.73 0.85 0.93  GFRNONAA >60 >60 >60 >60    LIVER FUNCTION TESTS: Recent Labs    04/20/21 0439 04/21/21 0458 04/22/21 0407 04/23/21 0415  BILITOT 0.7 0.5 0.6 0.4  AST _2 ALT _3 ALKPHOS 66 70 65 67  PROT 5.3* 5.4* 5.5* 6.2*  ALBUMIN 2.8* 2.9* 2.9* 3.4*    TUMOR MARKERS: No results for input(s): AFPTM, CEA, CA199, CHROMGRNA in the last 8760 hours.  Assessment and Plan: 80 y.o. female with hx recurrent, metastatic left breast CA, with recent finding of multiple liver lesions concerning for liver mets. She is currently hospitalized for intractable N/V, as well as c-diff infection.   IR was requested for image guided liver lesion by by GI, case was reviewed and approved by Dr. Pascal Lux.   The procedure is tentatively scheduled for tomorrow 12/28.    NPO at MN  CBC  all w/in normal limit  CRP and sed rate normal  LFT normal  INR 1.0, not on AC/AP   Risks and benefits of liver lesion biopsy  was discussed with the patient and/or patient's family including, but not limited to bleeding, infection, damage to adjacent structures or low yield requiring additional tests.  All of the questions were answered and there is agreement to proceed.  Consent signed and in chart.   Thank you for this interesting consult.  I greatly enjoyed meeting Sara Barnes and look forward to participating in their care.  A copy of this report was sent to the requesting provider on this date.  Electronically Signed: Tera Mater, PA-C 04/23/2021, 11:17 AM   I spent a total  of 40 Minutes    in face to face in clinical consultation, greater than 50% of which was counseling/coordinating care for liver lesion bx.   This chart was dictated using voice recognition software.  Despite best efforts to proofread,  errors can occur which can change the documentation meaning.

## 2021-04-21 NOTE — Progress Notes (Signed)
PROGRESS NOTE    Sara Barnes  HMC:[id number] DOB: 1940/05/12 DOA: 04/19/2021 PCP: Ann Held, DO   Brief Narrative:  The patient is a 80 year old Caucasian female with a past medical history significant for but not limited to insomnia, Crohn's disease, depression, dyspepsia as well as other comorbidities who presented to the ED on 04/19/2021 with intractable nausea, vomiting and diarrhea.  Patient states for last 2 weeks she has been having multiple bowel movements daily with severe nausea and poor p.o. appetite and has been vomiting.  She is unable to tolerate much p.o. intake without vomiting.  She states that she feels like she is getting weaker and she does have a history of Crohn's disease and does have some chronic diarrhea but feels this is worse than her baseline.  At home she takes Imodium and Lomotil and is also on 5 mg of prednisone for Crohn's.  She follows up regularly with GI Dr. Hilarie Fredrickson and because her symptoms progressively got worse she presented to the ED for further evaluation.  On admission her vital signs were relatively stable and labs showed a sodium of 130.  Imaging obtained in the ED showed showed multiple hypodense hepatic masses however her bowels appeared fine.  In the ED she is given Dilaudid, Ativan, IV fluids, Zofran and the hospitalist service was consulted for further evaluation and management.  Further work-up reveals that she is positive for C. difficile toxigenic PCR and antigen so she is initially on C. difficile treatment and will be placed on probiotics as well.   Assessment & Plan:   Principal Problem:   Intractable nausea and vomiting Active Problems:   Depression   Diarrhea   Crohn disease (HCC)   Hyponatremia  Intractable nausea and vomiting associated with diarrhea in the setting of C. difficile diarrhea, improving, continues to have still a lot of diarrhea -CT of the abdomen pelvis demonstrated multiple hypodense hepatic masses however  her bowels appear to be fine -Unlikely a Crohn's flare given negative inflammation but given her symptoms but GI pathogen panel and C. difficile testing were ordered -C. difficile was positive antigen and PCR is positive for toxigenic C. difficile -Initiated on p.o. vancomycin -We will continue antiemetics with p.o./IV ondansetron every 6 as needed nausea -She received a normal saline bolus in the ED along with a lactated Ringer's 500 mL bolus and then placed on maintenance IV fluids with LR at 125 MLS per hour which we will continue -Continue with supportive care and initiate probiotics -Currently the patient is afebrile and has no white count and WBC is now 6.2 -We will hold her PPI and famotidine currently given her diarrhea -GI was consulted for further evaluation given her intractable nausea vomiting diarrhea and initially had placed her back on her antidiarrheals but will hold given her C. difficile being positive -Initiated on Vancomycin now  Crohn's Disease -Unclear if this is likely related to her current presentation -Resumed 5 mg of p.o. prednisone -GI initiated back on her antidiarrheals but will hold given her C. 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 Truitt Merle via Epic  -I spoke with Dr. Owens Shark of the IR team who request to place a ultrasound-guided biopsy and this will likely be done Tuesday,  04/23/2021  Hypokalemia -Patient's potassium level was 3.4 -In setting of her diarrhea -Replete with p.o. potassium chloride 40 mg twice daily x2 doses -Continue to monitor and replete as necessary and repeat CMP in a.m.  Hyponatremia -Mild and Improving -Na+ went from 130 -> 134 x2 -C/w IVF Hydration as above -Repeat CMP in the AM   Depression and Anxiety -C/w Fluoxetine 20 mg po Daily   Insomnia  -C/w Zolpidem 10 mg po qHSprn Sleep   DVT prophylaxis: Enoxaparin 40 mg sq q24h Code Status: FULL CODE  Family Communication: Full code Disposition Plan: Pending further clinical improvement  Status is: Inpatient  Remains inpatient appropriate because: Patient continues have significant diarrhea and is extremely weak from the C Diff  Consultants:  Gastroenterology   Procedures:  None  Antimicrobials:  Anti-infectives (From admission, onward)    Start     Dose/Rate Route Frequency Ordered Stop   04/20/21 1500  vancomycin (VANCOCIN) capsule 125 mg        125 mg Oral 4 times daily 04/20/21 1405 04/30/21 1359       Subjective: The patient was seen and examined at bedside and she states that she did not have a good night as she had issues with the nighttime nurse and complained of significant back pain now.  Asking for Dilaudid.  No nausea or vomiting.  Continues to have quite a bit of diarrhea.  No other concerns or complaints at this time.  Objective: Vitals:   04/20/21 1330 04/20/21 1758 04/20/21 2035 04/21/21 0547  BP: 119/65 127/62 (!) 160/85 (!) 147/78  Pulse: 79 84 70 70  Resp: 17 17 18 18   Temp: (!) 97.4 F (36.3 C) 98.4 F (36.9 C) 97.8 F (36.6 C) 98.7 F (37.1 C)  TempSrc:   Oral Oral  SpO2: 98% 96% 97% 97%  Weight:      Height:        Intake/Output Summary (Last 24 hours) at 04/21/2021 1229 Last data filed at 04/21/2021 1024 Gross per 24 hour  Intake 3238.8 ml  Output --  Net 3238.8 ml    Filed Weights   04/19/21 2130  Weight: 72.1 kg    Examination: Physical Exam:  Constitutional: WN/WD overweight Caucasian female currently in no acute distress Eyes:  Lids and conjunctivae normal, sclerae anicteric  ENMT: External Ears, Nose appear normal. Grossly normal hearing. Mucous membranes are moist.  Neck: Appears normal, supple, no cervical masses, normal ROM, no appreciable thyromegaly; no appreciable JVD Respiratory: Diminished to auscultation bilaterally, no wheezing, rales, rhonchi or crackles. Normal respiratory effort and patient is not tachypenic. No accessory muscle use.  Unlabored breathing Cardiovascular: RRR, no murmurs / rubs / gallops. S1 and S2 auscultated.  No appreciable extremity edema Abdomen: Soft, mildly-tender, distended second body habitus. Bowel sounds positive.  GU: Deferred. Musculoskeletal: No clubbing / cyanosis of digits/nails. No joint deformity upper and lower extremities.  Skin: No rashes, lesions, ulcers on limited skin evaluation. No induration; Warm and dry.  Neurologic: CN 2-12 grossly intact with no focal deficits. Romberg sign and cerebellar reflexes not assessed.  Psychiatric: Normal judgment and insight. Alert and oriented x 3. Normal mood and appropriate affect.  Data Reviewed: I have personally reviewed following labs and imaging studies  CBC: Recent Labs  Lab 04/19/21 1143 04/20/21 0439 04/21/21 0458  WBC 8.7 6.2 6.4  NEUTROABS  --   --  4.2  HGB 15.7* 12.8 13.0  HCT 45.5 38.2 38.0  MCV 92.7 93.6 93.6  PLT 279 213 299    Basic Metabolic Panel: Recent Labs  Lab 04/19/21 1143 04/20/21 0439 04/21/21 0458  NA 130* 134* 134*  K 3.9 3.8 3.4*  CL 99 104 103  CO2 22 23 25   GLUCOSE 113* 85 92  BUN 10 7* 8  CREATININE 0.89 0.76 0.73  CALCIUM 9.0 8.4* 8.5*  MG  --  1.8 1.9  PHOS  --  3.4 3.3    GFR: Estimated Creatinine Clearance: 58 mL/min (by C-G formula based on SCr of 0.73 mg/dL). Liver Function Tests: Recent Labs  Lab 04/19/21 1143 04/20/21 0439 04/21/21 0458   AST 21 18 16   ALT 15 14 13   ALKPHOS 97 66 70  BILITOT 0.8 0.7 0.5  PROT 6.9 5.3* 5.4*  ALBUMIN 3.7 2.8* 2.9*    Recent Labs  Lab 04/19/21 1143  LIPASE 20    No results for input(s): AMMONIA in the last 168 hours. Coagulation Profile: No results for input(s): INR, PROTIME in the last 168 hours. Cardiac Enzymes: No results for input(s): CKTOTAL, CKMB, CKMBINDEX, TROPONINI in the last 168 hours. BNP (last 3 results) No results for input(s): PROBNP in the last 8760 hours. HbA1C: No results for input(s): HGBA1C in the last 72 hours. CBG: No results for input(s): GLUCAP in the last 168 hours. Lipid Profile: No results for input(s): CHOL, HDL, LDLCALC, TRIG, CHOLHDL, LDLDIRECT in the last 72 hours. Thyroid Function Tests: No results for input(s): TSH, T4TOTAL, FREET4, T3FREE, THYROIDAB in the last 72 hours. Anemia Panel: No results for input(s): VITAMINB12, FOLATE, FERRITIN, TIBC, IRON, RETICCTPCT in the last 72 hours. Sepsis Labs: No results for input(s): PROCALCITON, LATICACIDVEN in the last 168 hours.  Recent Results (from the past 240 hour(s))  Resp Panel by RT-PCR (Flu A&B, Covid) Nasopharyngeal Swab     Status: None   Collection Time: 04/19/21 12:48 PM   Specimen: Nasopharyngeal Swab; Nasopharyngeal(NP) swabs in vial transport medium  Result Value Ref Range Status   SARS Coronavirus 2 by RT PCR NEGATIVE NEGATIVE Final    Comment: (NOTE) SARS-CoV-2 target nucleic acids are NOT DETECTED.  The SARS-CoV-2 RNA is generally detectable in upper respiratory specimens during the acute phase of infection. The lowest concentration of SARS-CoV-2 viral copies this assay can detect is 138 copies/mL. A negative result does not preclude SARS-Cov-2 infection and should not be used as the sole basis for treatment or other patient management decisions. A negative result may occur with  improper specimen collection/handling, submission of specimen other than nasopharyngeal swab,  presence of viral mutation(s) within the areas targeted by this assay, and inadequate number of viral copies(<138 copies/mL). A negative result must be combined with clinical observations, patient history, and epidemiological information. The expected result is Negative.  Fact Sheet for Patients:  EntrepreneurPulse.com.au  Fact Sheet for Healthcare Providers:  IncredibleEmployment.be  This test is no t yet approved or cleared by the [place] FDA and  has been authorized for detection and/or diagnosis of SARS-CoV-2 by FDA under an Emergency Use Authorization (EUA). This EUA will remain  in effect (meaning this test can be used) for the duration of the COVID-19 declaration under Section 564(b)(1) of the Act, 21 U.S.C.section  360bbb-3(b)(1), unless the authorization is terminated  or revoked sooner.       Influenza A by PCR NEGATIVE NEGATIVE Final   Influenza B by PCR NEGATIVE NEGATIVE Final    Comment: (NOTE) The Xpert Xpress SARS-CoV-2/FLU/RSV plus assay is intended as an aid in the diagnosis of influenza from Nasopharyngeal swab specimens and should not be used as a sole basis for treatment. Nasal washings and aspirates are unacceptable for Xpert Xpress SARS-CoV-2/FLU/RSV testing.  Fact Sheet for Patients: EntrepreneurPulse.com.au  Fact Sheet for Healthcare Providers: IncredibleEmployment.be  This test is not yet approved or cleared by the [place] FDA and has been authorized for detection and/or diagnosis of SARS-CoV-2 by FDA under an Emergency Use Authorization (EUA). This EUA will remain in effect (meaning this test can be used) for the duration of the COVID-19 declaration under Section 564(b)(1) of the Act, 21 U.S.C. section 360bbb-3(b)(1), unless the authorization is terminated or revoked.  Performed at [facility], [place] [address], [place], [place] [address]   Urine  Culture     Status: None   Collection Time: 04/19/21 12:56 PM   Specimen: Urine, Clean Catch  Result Value Ref Range Status   Specimen Description   Final    URINE, CLEAN CATCH Performed at [facility], [place] [address], [place], [place] [address]    Special Requests   Final    NONE Performed at [facility], [place] [address] [place], [place] [address]    Culture   Final    NO GROWTH Performed at [facility], [place] [address], [place], [place] [address]    Report Status 04/20/2021 FINAL  Final  Gastrointestinal Panel by PCR , Stool     Status: None   Collection Time: 04/20/21 10:01 AM   Specimen: STOOL  Result Value Ref Range Status   Campylobacter species NOT DETECTED NOT DETECTED Final   Plesimonas shigelloides NOT DETECTED NOT DETECTED Final   Salmonella species NOT DETECTED NOT DETECTED Final   Yersinia enterocolitica NOT DETECTED NOT DETECTED Final   Vibrio species NOT DETECTED NOT DETECTED Final   Vibrio cholerae NOT DETECTED NOT DETECTED Final   Enteroaggregative E coli (EAEC) NOT DETECTED NOT DETECTED Final   Enteropathogenic E coli (EPEC) NOT DETECTED NOT DETECTED Final   Enterotoxigenic E coli (ETEC) NOT DETECTED NOT DETECTED Final   Shiga like toxin producing E coli (STEC) NOT DETECTED NOT DETECTED Final   Shigella/Enteroinvasive E coli (EIEC) NOT DETECTED NOT DETECTED Final   Cryptosporidium NOT DETECTED NOT DETECTED Final   Cyclospora cayetanensis NOT DETECTED NOT DETECTED Final   Entamoeba histolytica NOT DETECTED NOT DETECTED Final   Giardia lamblia NOT DETECTED NOT DETECTED Final   Adenovirus F40/41 NOT DETECTED NOT DETECTED Final   Astrovirus NOT DETECTED NOT DETECTED Final   Norovirus GI/GII NOT DETECTED NOT DETECTED Final   Rotavirus A NOT DETECTED NOT DETECTED Final   Sapovirus (I, II, IV, and V) NOT DETECTED NOT DETECTED Final    Comment: Performed at [facility], [address] [place], [place]  [address]  C Difficile Quick Screen w PCR reflex     Status: Abnormal   Collection Time: 04/20/21 10:01 AM   Specimen: STOOL  Result Value Ref Range Status   C Diff antigen POSITIVE (A) NEGATIVE Final   C Diff toxin NEGATIVE NEGATIVE Final   C Diff interpretation Results are indeterminate. See PCR results.  Final    Comment: Performed at [facility], [place] [address], [place], [place] [address]  C. Diff by PCR, Reflexed     Status: Abnormal   Collection Time: 04/20/21 10:01 AM  Result Value Ref Range Status   Toxigenic C. Difficile by PCR POSITIVE (A) NEGATIVE Final    Comment: Positive for toxigenic C. difficile with little to no toxin production. Only treat if clinical presentation suggests symptomatic illness. Performed at [facility], [place] [address], [place],  [address]      RN Pressure Injury Documentation:     Estimated body mass index is 25.66 kg/m as calculated from the following:   Height as of this encounter: 5' 6"  (1.676 m).   Weight as of this encounter: 72.1 kg.  Malnutrition Type:  Nutrition Problem: Inadequate oral intake Etiology: acute illness  Malnutrition Characteristics:  Signs/Symptoms: per patient/family report  Nutrition Interventions:     Radiology Studies: CT ABDOMEN PELVIS W CONTRAST  Result Date: 04/19/2021 CLINICAL DATA:  Nausea, vomiting, abdominal pain, acute nonlocalized. History of left breast with lumpectomy and radiation. EXAM: CT ABDOMEN AND PELVIS WITH CONTRAST TECHNIQUE: Multidetector CT imaging of the abdomen and pelvis was performed using the standard protocol following bolus administration of intravenous contrast. CONTRAST:  103m OMNIPAQUE IOHEXOL 350 MG/ML SOLN COMPARISON:  CT examination dated November 11, 2019 FINDINGS: Lower chest: Moderate left and small right pleural effusion with left basilar atelectasis. Small hiatal hernia. Hepatobiliary: At least 3 hyperdense masses in the liver. In the left hepatic  lobe measuring 2.2 x 2.2 cm (series 2 image 9). Another hypodense structure abutting the diaphragm measuring 1.4 x 1.6 cm (series 2, image 8). Another hyperdense structure in the inferior aspect of the right hepatic lobe (series 2, image 20). These masses are highly suspicious for metastatic disease until proven otherwise. Gallbladder is unremarkable. No biliary ductal dilatation. Pancreas: Unremarkable. No pancreatic ductal dilatation or surrounding inflammatory changes. Spleen: Normal in size without focal abnormality. Adrenals/Urinary Tract: Adrenal glands are unremarkable. Kidneys are normal, without renal calculi, focal lesion, or hydronephrosis. Bladder is unremarkable. Stomach/Bowel: Stomach is within normal limits. Small bowel loops are normal in caliber. Postsurgical changes for prior hemicolectomy. No evidence of bowel wall thickening, distention, or inflammatory changes. Vascular/Lymphatic: Aortic atherosclerosis. No enlarged abdominal or pelvic lymph nodes. Reproductive: Status post hysterectomy. No adnexal masses. Other: No abdominal wall hernia or abnormality. No abdominopelvic ascites. Musculoskeletal: Mild multilevel degenerative disc disease. No acute osseous abnormality. IMPRESSION: 1. Multiple hypodense hepatic masses, as detailed above, highly suspicious for metastatic disease. Differential include infectious/inflammatory process. Further evaluation with MRI examination with contrast would be helpful. 2. Moderate left and small right pleural effusion. 3. Postsurgical changes for prior hemicolectomy. No bowel obstruction, colitis or diverticulitis. Electronically Signed   By: IKeane PoliceD.O.   On: 04/19/2021 16:13    Scheduled Meds:  enoxaparin (LOVENOX) injection  40 mg Subcutaneous Q24H   feeding supplement  1 Container Oral TID BM   FLUoxetine  20 mg Oral Daily   multivitamin with minerals  1 tablet Oral Daily   potassium chloride  40 mEq Oral BID   predniSONE  5 mg Oral Q breakfast    vancomycin  125 mg Oral QID   Continuous Infusions:  lactated ringers 125 mL/hr at 04/20/21 1532    LOS: 2 days   OKerney Elbe DO [facility] PAGER is on AMION  If 7PM-7AM, please contact night-coverage [email or web address]

## 2021-04-22 ENCOUNTER — Inpatient Hospital Stay (HOSPITAL_COMMUNITY): Payer: Medicare HMO

## 2021-04-22 DIAGNOSIS — A09 Infectious gastroenteritis and colitis, unspecified: Secondary | ICD-10-CM

## 2021-04-22 DIAGNOSIS — A498 Other bacterial infections of unspecified site: Secondary | ICD-10-CM

## 2021-04-22 DIAGNOSIS — R160 Hepatomegaly, not elsewhere classified: Secondary | ICD-10-CM

## 2021-04-22 DIAGNOSIS — K50919 Crohn's disease, unspecified, with unspecified complications: Secondary | ICD-10-CM | POA: Diagnosis not present

## 2021-04-22 LAB — CBC WITH DIFFERENTIAL/PLATELET
Abs Immature Granulocytes: 0.03 10*3/uL (ref 0.00–0.07)
Basophils Absolute: 0 10*3/uL (ref 0.0–0.1)
Basophils Relative: 1 %
Eosinophils Absolute: 0.1 10*3/uL (ref 0.0–0.5)
Eosinophils Relative: 1 %
HCT: 38.4 % (ref 36.0–46.0)
Hemoglobin: 12.8 g/dL (ref 12.0–15.0)
Immature Granulocytes: 0 %
Lymphocytes Relative: 17 %
Lymphs Abs: 1.4 10*3/uL (ref 0.7–4.0)
MCH: 31.6 pg (ref 26.0–34.0)
MCHC: 33.3 g/dL (ref 30.0–36.0)
MCV: 94.8 fL (ref 80.0–100.0)
Monocytes Absolute: 0.9 10*3/uL (ref 0.1–1.0)
Monocytes Relative: 11 %
Neutro Abs: 5.9 10*3/uL (ref 1.7–7.7)
Neutrophils Relative %: 70 %
Platelets: 200 10*3/uL (ref 150–400)
RBC: 4.05 MIL/uL (ref 3.87–5.11)
RDW: 11.9 % (ref 11.5–15.5)
WBC: 8.5 10*3/uL (ref 4.0–10.5)
nRBC: 0 % (ref 0.0–0.2)

## 2021-04-22 LAB — MAGNESIUM: Magnesium: 2.2 mg/dL (ref 1.7–2.4)

## 2021-04-22 LAB — COMPREHENSIVE METABOLIC PANEL
ALT: 11 U/L (ref 0–44)
AST: 16 U/L (ref 15–41)
Albumin: 2.9 g/dL — ABNORMAL LOW (ref 3.5–5.0)
Alkaline Phosphatase: 65 U/L (ref 38–126)
Anion gap: 5 (ref 5–15)
BUN: 9 mg/dL (ref 8–23)
CO2: 26 mmol/L (ref 22–32)
Calcium: 8.6 mg/dL — ABNORMAL LOW (ref 8.9–10.3)
Chloride: 102 mmol/L (ref 98–111)
Creatinine, Ser: 0.85 mg/dL (ref 0.44–1.00)
GFR, Estimated: >60 mL/min (ref >60)
Glucose, Bld: 90 mg/dL (ref 70–99)
Potassium: 4.4 mmol/L (ref 3.5–5.1)
Sodium: 133 mmol/L — ABNORMAL LOW (ref 135–145)
Total Bilirubin: 0.6 mg/dL (ref 0.3–1.2)
Total Protein: 5.5 g/dL — ABNORMAL LOW (ref 6.5–8.1)

## 2021-04-22 LAB — PHOSPHORUS: Phosphorus: 3.4 mg/dL (ref 2.5–4.6)

## 2021-04-22 LAB — TROPONIN I (HIGH SENSITIVITY)
Troponin I (High Sensitivity): 13 ng/L (ref <18)
Troponin I (High Sensitivity): 11 ng/L (ref <18)

## 2021-04-22 MED ORDER — LEVALBUTEROL HCL 0.63 MG/3ML IN NEBU
0.6300 mg | INHALATION_SOLUTION | Freq: Four times a day (QID) | RESPIRATORY_TRACT | Status: DC
  Filled 2021-04-22: qty 3

## 2021-04-22 MED ORDER — IPRATROPIUM-ALBUTEROL 0.5-2.5 (3) MG/3ML IN SOLN: 3.0000 mL | Freq: Two times a day (BID) | RESPIRATORY_TRACT | Status: DC

## 2021-04-22 MED ORDER — IPRATROPIUM BROMIDE 0.02 % IN SOLN: 0.5000 mg | Freq: Four times a day (QID) | RESPIRATORY_TRACT | Status: DC

## 2021-04-22 MED ORDER — ENOXAPARIN SODIUM 40 MG/0.4ML IJ SOSY
40.0000 mg | PREFILLED_SYRINGE | Freq: Every day | INTRAMUSCULAR | Status: DC
  Administered 2021-04-25 – 2021-04-26 (×2): 40 mg via SUBCUTANEOUS
  Filled 2021-04-22 (×2): qty 0.4

## 2021-04-22 MED ORDER — IPRATROPIUM-ALBUTEROL 0.5-2.5 (3) MG/3ML IN SOLN
3.0000 mL | Freq: Two times a day (BID) | RESPIRATORY_TRACT | Status: DC
Start: 2021-04-22 — End: 2021-04-24
  Administered 2021-04-22: 20:00:00 3 mL via RESPIRATORY_TRACT
  Filled 2021-04-22 (×3): qty 3

## 2021-04-22 MED ORDER — IPRATROPIUM-ALBUTEROL 0.5-2.5 (3) MG/3ML IN SOLN
3.0000 mL | Freq: Four times a day (QID) | RESPIRATORY_TRACT | Status: DC
  Administered 2021-04-22: 15:00:00 3 mL via RESPIRATORY_TRACT
  Filled 2021-04-22: qty 3

## 2021-04-22 MED ORDER — FUROSEMIDE 10 MG/ML IJ SOLN
40.0000 mg | Freq: Once | INTRAMUSCULAR | Status: AC
  Administered 2021-04-22: 11:00:00 40 mg via INTRAVENOUS
  Filled 2021-04-22: qty 4

## 2021-04-22 NOTE — Progress Notes (Signed)
Patient with SOB with exertion, wheezing, and left rib cage pain. Vitals stable on room air. Provider notified. Chest xray and EKG ordered. Will continue to assess patient.

## 2021-04-22 NOTE — Progress Notes (Signed)
[facility] Progress Note  CC:  Crohn's disease, liver lesions   Subjective: She is having less diarrhea today, yesterday had 3 to 4 episodes and 2 episodes thus far today. Scant amount of blood on the toilet tissue. No abdominal pain. No N/V. No CP or SOB. Complains of lower back pain and burning with urination. Family member at the bedside.   Objective:  Vital signs in last 24 hours: Temp:  [97.4 F (36.3 C)-97.7 F (36.5 C)] 97.7 F (36.5 C) (12/26 0431) Pulse Rate:  [65-72] 65 (12/26 0431) Resp:  [18-20] 20 (12/26 0431) BP: (131-176)/(70-95) 176/95 (12/26 0431) SpO2:  [97 %-98 %] 98 % (12/26 0431) Last BM Date: 04/21/21 General:   Alert 80 year old female in NAD. Heart: RRR, no murmur.  Pulm:  Breath sounds clear throughout.  Abdomen: Soft, nontender. Extensive midline scar, right mid scar intact. + BS x 4 quads. No mass.  Extremities:  Without edema. Neurologic:  Alert and  oriented x4.  Grossly normal neurologically. Psych:  Alert and cooperative. Normal mood and affect.  Intake/Output from previous day: 12/25 0701 - 12/26 0700 In: 4708 [P.O.:1080; I.V.:3628] Out: 0  Intake/Output this shift: No intake/output data recorded.  Lab Results: Recent Labs    04/20/21 0439 04/21/21 0458 04/22/21 0407  WBC 6.2 6.4 8.5  HGB 12.8 13.0 12.8  HCT 38.2 38.0 38.4  PLT 213 213 200   BMET Recent Labs    04/20/21 0439 04/21/21 0458 04/22/21 0407  NA 134* 134* 133*  K 3.8 3.4* 4.4  CL 104 103 102  CO2 23 25 26   GLUCOSE 85 92 90  BUN 7* 8 9  CREATININE 0.76 0.73 0.85  CALCIUM 8.4* 8.5* 8.6*   LFT Recent Labs    04/22/21 0407  PROT 5.5*  ALBUMIN 2.9*  AST 16  ALT 11  ALKPHOS 65  BILITOT 0.6   PT/INR No results for input(s): LABPROT, INR in the last 72 hours. Hepatitis Panel No results for input(s): HEPBSAG, HCVAB, HEPAIGM, HEPBIGM in the last 72 hours.  DG Chest 1 View  Result Date: 04/22/2021 CLINICAL DATA:  80 year old female with  shortness of breath. EXAM: CHEST  1 VIEW COMPARISON:  Chest radiograph 10/30/2016 and earlier. CT Abdomen and Pelvis 04/19/2021. FINDINGS: Portable AP semi upright view at 0607 hours. Some artifact due to bilateral breast implants, but also veiling opacity at the left lung base in keeping with ongoing left pleural effusion demonstrated recently on CT Abdomen and Pelvis. No superimposed pneumothorax or pulmonary edema. No air bronchograms. Allowing for portable technique mediastinal contours appear stable since 2018. Visualized tracheal air column is within normal limits. Chronic left chest wall surgical clips. No acute osseous abnormality identified. IMPRESSION: 1. Ongoing small to moderate left pleural effusion seen recently on CT Abdomen and Pelvis. 2. Artifact from breast implants. No other acute cardiopulmonary abnormality. Electronically Signed   By: Genevie Ann M.D.   On: 04/22/2021 06:51    Assessment / Plan:  1) 80 y.o. 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  On chronic Prednisone, Lomotil and Imodium for IBD therapy.  She was admitted to the hospital 04/19/2021 worsening diarrhea due to C. Difficile,  less likely Crohn's flare.  No recent antibiotics. C. Diff antigen +, C. Diff toxin negative.  Toxigenic C. difficile PCR positive.  WBC 8.5.  Started on Vanco.  Remains on low dose Prednisone. Diarrhea has decreased.  -Continue Vancomycin 125 mg p.o. 4 times daily -Continue low-dose Prednisone for now (maintenance dose for Crohn's disease) may need to wean off if C. difficile diarrhea worsens -Florastor probiotic one po bid -Diet as tolerated -BMP and CBC in am  2) History of breast cancer 2016 s/p laminectomy. She was found to have a metastatic site of breast cancer in skin of left chest after excisional biopsy with negative  margins 06/2020. She followed with her oncology team 07/2020 who recommended a PET scan to check for other sites of metastasis however she declined and has not been back to see them since then. CTAP 12.23/2022 showed new masses in her liver and also bilateral pleural effusions.   -Liver biopsy ordered by the medical team  3) Dysuria  -UA/CX if not already done, defer to the medical service    Principal Problem:   Intractable nausea and vomiting Active Problems:   Depression   Diarrhea   Crohn disease ([place])   Hyponatremia     LOS: 3 days   Noralyn Pick  04/22/2021, 11:08 AM

## 2021-04-22 NOTE — Progress Notes (Signed)
Patient approved for percutaneous liver lesion biopsy per Dr. Pascal Lux.  Tentatively scheduled for 04/23/21 pending any emergent IR procedures which would take precedence. This can also be scheduled as an outpatient if the patient is planned for discharge - please let IR know if this is the case so we can setup outpatient appointment.  Patient to be NPO at midnight, hold anticoagulation, AM labs ordered. IR APP will see for consult/consent 12/27.  Candiss Norse, PA-C

## 2021-04-22 NOTE — Progress Notes (Signed)
PROGRESS NOTE    Sara Barnes  MEQ:[id number] DOB: 1940/05/13 DOA: 04/19/2021 PCP: Ann Held, DO   Brief Narrative:  The patient is a 80 year old Caucasian female with a past medical history significant for but not limited to insomnia, Crohn's disease, depression, dyspepsia as well as other comorbidities who presented to the ED on 04/19/2021 with intractable nausea, vomiting and diarrhea.  Patient states for last 2 weeks she has been having multiple bowel movements daily with severe nausea and poor p.o. appetite and has been vomiting.  She is unable to tolerate much p.o. intake without vomiting.  She states that she feels like she is getting weaker and she does have a history of Crohn's disease and does have some chronic diarrhea but feels this is worse than her baseline.  At home she takes Imodium and Lomotil and is also on 5 mg of prednisone for Crohn's.  She follows up regularly with GI Dr. Hilarie Fredrickson and because her symptoms progressively got worse she presented to the ED for further evaluation.  On admission her vital signs were relatively stable and labs showed a sodium of 130.  Imaging obtained in the ED showed showed multiple hypodense hepatic masses however her bowels appeared fine.  In the ED she is given Dilaudid, Ativan, IV fluids, Zofran and the hospitalist service was consulted for further evaluation and management.  Further work-up reveals that she is positive for C. difficile toxigenic PCR and antigen so she is initially on C. difficile treatment and will be placed on probiotics as well.  Diarrhea is slowing down but she is still getting IV fluid hydration at 1.5 mils per hour.  She then developed shortness of breath with exertion and wheezing as well as left-sided rib cage pain.  Chest x-ray and EKG were ordered and that showed bilateral pleural effusions.  IV fluid hydration has been stopped and troponins were checked.  Plan is for a ultrasound biopsy of the liver that is  tentatively scheduled for 04/23/2021 pending any emergent IR cases.  Assessment & Plan:   Principal Problem:   Intractable nausea and vomiting Active Problems:   Depression   Diarrhea   Crohn disease (HCC)   Hyponatremia  Intractable nausea and vomiting associated with diarrhea in the setting of C. difficile diarrhea, improving -CT of the abdomen pelvis demonstrated multiple hypodense hepatic masses however her bowels appear to be fine -Unlikely a Crohn's flare given negative inflammation but given her symptoms but GI pathogen panel and C. difficile testing were ordered -C. difficile was positive antigen and PCR is positive for toxigenic C. difficile -Initiated on p.o. vancomycin -We will continue antiemetics with p.o./IV ondansetron every 6 as needed nausea -She received a normal saline bolus in the ED along with a lactated Ringer's 500 mL bolus and then placed on maintenance IV fluids with LR at 125 MLS per hour which we have now stopped given that she appears volume overloaded and became dyspneic -Continue with supportive care and initiate probiotics -Currently the patient is afebrile and has no white count and WBC is now 6.2 and trended up slowly -> 6.4 -> 8.5 -We will hold her PPI and famotidine currently given her diarrhea -GI was consulted for further evaluation given her intractable nausea vomiting diarrhea and initially had placed her back on her antidiarrheals but will hold given her C. difficile being positive -Initiated on Vancomycin now and will continue   Dyspnea with wheezing -Likely in the setting of volume overload that she received too  much IV fluid resuscitation -She received lactated Ringer's 125 MLS per hour for 2 days straight given her diarrhea and is now +8.953 L -Stop IV fluid hydration -Initiated Xopenex and Atrovent and has albuterol as needed breathing treatments -Flutter valve, incentive spirometry and will give a dose of IV Lasix 40 mg x 1 -Chest x-ray  shows "Ongoing small to moderate left pleural effusion seen recently on CT Abdomen and Pelvis. Artifact from breast implants. No other acute cardiopulmonary abnormality." -SpO2: 94 %; Not on O2 anymore  -Continue to Monitor closely   Chest Discomfort -In the setting of Volume overload and Effusions -EKG unrevealing and Troponin Flat -Continue to Monitor closely   Crohn's Disease -Unclear if this is likely related to her current presentation -Resumed 5 mg of p.o. prednisone -GI initiated back on her antidiarrheals but will hold given her C. 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 Truitt Merle via Epic  -I spoke with Dr. Owens Shark of the IR team who request to place a ultrasound-guided biopsy and this will likely be done Tuesday, 04/23/2021; She will be NPO at midnight  Hypokalemia -Patient's potassium level was 3.4 and improved to 4.4 -In setting of her diarrhea -Replete with p.o. potassium chloride 40 mg twice daily x2 doses -Continue to monitor and replete as necessary and repeat CMP in a.m.  Hyponatremia -Mild and Improving -Na+ went from 130 -> 134 x2 -> 133 -C/w IVF Hydration as above -Repeat CMP in the AM   Depression and Anxiety -C/w Fluoxetine 20 mg po Daily   Insomnia  -C/w Zolpidem 10 mg po qHSprn Sleep   DVT prophylaxis: Enoxaparin 40 mg sq q24h Code Status: FULL CODE  Family Communication:  Full code Disposition Plan: Pending further clinical improvement  Status is: Inpatient  Remains inpatient appropriate because: Patient continues have significant diarrhea and is extremely weak from the C Diff  Consultants:  Gastroenterology   Procedures:  None  Antimicrobials:  Anti-infectives (From admission, onward)    Start     Dose/Rate Route Frequency Ordered Stop   04/20/21 1500  vancomycin (VANCOCIN) capsule 125 mg        125 mg Oral 4 times daily 04/20/21 1405 04/30/21 1359       Subjective: The patient was seen and examined at bedside and was anxious this morning and was very dyspneic earlier and had some chest discomfort. IVF has now been discontinued and CXR showed bilateral Pleural Effusions. No nausea or vomiting and thinks the Vancomycin is making her Diarrhea worse. No other concerns or complaints at this time.   Objective: Vitals:   04/22/21 0431 04/22/21 1204 04/22/21 1349 04/22/21 1433  BP: (!) 176/95 (!) 140/59    Pulse: 65 69    Resp: 20 20    Temp: 97.7 F (36.5 C) 97.8 F (36.6 C)    TempSrc: Oral     SpO2: 98% 94% 95% 94%  Weight:  Height:        Intake/Output Summary (Last 24 hours) at 04/22/2021 1458 Last data filed at 04/22/2021 2426 Gross per 24 hour  Intake 4588.03 ml  Output 0 ml  Net 4588.03 ml    Filed Weights   04/19/21 2130  Weight: 72.1 kg   Examination: Physical Exam:  Constitutional: WN/WD overweight elderly Caucasian female, in no acute distress Eyes: Lids and conjunctivae normal, sclerae anicteric  ENMT: External Ears, Nose appear normal. Grossly normal hearing. Mucous membranes are moist.  Neck: Appears normal, supple, no cervical masses, normal ROM, no appreciable thyromegaly Respiratory: Diminished to auscultation bilaterally with coarse breath sounds and some crackles noted at the bases., no wheezing, rales, rhonchi. Normal respiratory effort and patient is not tachypenic. No accessory muscle use.  Unlabored  breathing but was placed on supplemental oxygen via nasal cannula for comfort Cardiovascular: RRR, no murmurs / rubs / gallops. S1 and S2 auscultated.  Mild 1+ extremity edema Abdomen: Soft, non-tender, slightly-distended. Bowel sounds positive.  GU: Deferred. Musculoskeletal: No clubbing / cyanosis of digits/nails. No joint deformity upper and lower extremities.  Skin: No rashes, lesions, ulcers on limited skin evaluation. No induration; Warm and dry.  Neurologic: CN 2-12 grossly intact with no focal deficits.  Romberg sign and cerebellar reflexes not assessed.  Psychiatric: Normal judgment and insight. Alert and oriented x 3.  Slightly anxious mood and appropriate affect.   Data Reviewed: I have personally reviewed following labs and imaging studies  CBC: Recent Labs  Lab 04/19/21 1143 04/20/21 0439 04/21/21 0458 04/22/21 0407  WBC 8.7 6.2 6.4 8.5  NEUTROABS  --   --  4.2 5.9  HGB 15.7* 12.8 13.0 12.8  HCT 45.5 38.2 38.0 38.4  MCV 92.7 93.6 93.6 94.8  PLT 279 213 213 834    Basic Metabolic Panel: Recent Labs  Lab 04/19/21 1143 04/20/21 0439 04/21/21 0458 04/22/21 0407  NA 130* 134* 134* 133*  K 3.9 3.8 3.4* 4.4  CL 99 104 103 102  CO2 22 23 25 26   GLUCOSE 113* 85 92 90  BUN 10 7* 8 9  CREATININE 0.89 0.76 0.73 0.85  CALCIUM 9.0 8.4* 8.5* 8.6*  MG  --  1.8 1.9 2.2  PHOS  --  3.4 3.3 3.4    GFR: Estimated Creatinine Clearance: 54.6 mL/min (by C-G formula based on SCr of 0.85 mg/dL). Liver Function Tests: Recent Labs  Lab 04/19/21 1143 04/20/21 0439 04/21/21 0458 04/22/21 0407  AST 21 18 16 16   ALT 15 14 13 11   ALKPHOS 97 66 70 65  BILITOT 0.8 0.7 0.5 0.6  PROT 6.9 5.3* 5.4* 5.5*  ALBUMIN 3.7 2.8* 2.9* 2.9*    Recent Labs  Lab 04/19/21 1143  LIPASE 20    No results for input(s): AMMONIA in the last 168 hours. Coagulation Profile: No results for input(s): INR, PROTIME in the last 168 hours. Cardiac Enzymes: No results for input(s): CKTOTAL, CKMB,  CKMBINDEX, TROPONINI in the last 168 hours. BNP (last 3 results) No results for input(s): PROBNP in the last 8760 hours. HbA1C: No results for input(s): HGBA1C in the last 72 hours. CBG: No results for input(s): GLUCAP in the last 168 hours. Lipid Profile: No results for input(s): CHOL, HDL, LDLCALC, TRIG, CHOLHDL, LDLDIRECT in the last 72 hours. Thyroid Function Tests: No results for input(s): TSH, T4TOTAL, FREET4, T3FREE, THYROIDAB in the last 72 hours. Anemia Panel: No results for input(s): VITAMINB12, FOLATE, FERRITIN, TIBC, IRON, RETICCTPCT in the last 72 hours. Sepsis  Labs: No results for input(s): PROCALCITON, LATICACIDVEN in the last 168 hours.  Recent Results (from the past 240 hour(s))  Resp Panel by RT-PCR (Flu A&B, Covid) Nasopharyngeal Swab     Status: None   Collection Time: 04/19/21 12:48 PM   Specimen: Nasopharyngeal Swab; Nasopharyngeal(NP) swabs in vial transport medium  Result Value Ref Range Status   SARS Coronavirus 2 by RT PCR NEGATIVE NEGATIVE Final    Comment: (NOTE) SARS-CoV-2 target nucleic acids are NOT DETECTED.  The SARS-CoV-2 RNA is generally detectable in upper respiratory specimens during the acute phase of infection. The lowest concentration of SARS-CoV-2 viral copies this assay can detect is 138 copies/mL. A negative result does not preclude SARS-Cov-2 infection and should not be used as the sole basis for treatment or other patient management decisions. A negative result may occur with  improper specimen collection/handling, submission of specimen other than nasopharyngeal swab, presence of viral mutation(s) within the areas targeted by this assay, and inadequate number of viral copies(<138 copies/mL). A negative result must be combined with clinical observations, patient history, and epidemiological information. The expected result is Negative.  Fact Sheet for Patients:  EntrepreneurPulse.com.au  Fact Sheet for Healthcare  Providers:  IncredibleEmployment.be  This test is no t yet approved or cleared by the [place] FDA and  has been authorized for detection and/or diagnosis of SARS-CoV-2 by FDA under an Emergency Use Authorization (EUA). This EUA will remain  in effect (meaning this test can be used) for the duration of the COVID-19 declaration under Section 564(b)(1) of the Act, 21 U.S.C.section 360bbb-3(b)(1), unless the authorization is terminated  or revoked sooner.       Influenza A by PCR NEGATIVE NEGATIVE Final   Influenza B by PCR NEGATIVE NEGATIVE Final    Comment: (NOTE) The Xpert Xpress SARS-CoV-2/FLU/RSV plus assay is intended as an aid in the diagnosis of influenza from Nasopharyngeal swab specimens and should not be used as a sole basis for treatment. Nasal washings and aspirates are unacceptable for Xpert Xpress SARS-CoV-2/FLU/RSV testing.  Fact Sheet for Patients: EntrepreneurPulse.com.au  Fact Sheet for Healthcare Providers: IncredibleEmployment.be  This test is not yet approved or cleared by the [place] FDA and has been authorized for detection and/or diagnosis of SARS-CoV-2 by FDA under an Emergency Use Authorization (EUA). This EUA will remain in effect (meaning this test can be used) for the duration of the COVID-19 declaration under Section 564(b)(1) of the Act, 21 U.S.C. section 360bbb-3(b)(1), unless the authorization is terminated or revoked.  Performed at [facility], [place] [address] [place], [place] [address]   Urine Culture     Status: None   Collection Time: 04/19/21 12:56 PM   Specimen: Urine, Clean Catch  Result Value Ref Range Status   Specimen Description   Final    URINE, CLEAN CATCH Performed at [facility], [place] [address], [place], [place] [address]    Special Requests   Final    NONE Performed at [facility], [place] [address], [place], [place] [address]    Culture   Final    NO GROWTH Performed at [facility], [place] [address], [place], [place] [address]    Report Status 04/20/2021 FINAL  Final  Gastrointestinal Panel by PCR , Stool     Status: None   Collection Time: 04/20/21 10:01 AM   Specimen: STOOL  Result Value Ref Range Status   Campylobacter species NOT DETECTED NOT DETECTED Final  Plesimonas shigelloides NOT DETECTED NOT DETECTED Final   Salmonella species NOT DETECTED NOT DETECTED Final   Yersinia enterocolitica NOT DETECTED NOT DETECTED Final   Vibrio species NOT DETECTED NOT DETECTED Final   Vibrio cholerae NOT DETECTED NOT DETECTED Final   Enteroaggregative E coli (EAEC) NOT DETECTED NOT DETECTED Final   Enteropathogenic E coli (EPEC) NOT DETECTED NOT DETECTED Final   Enterotoxigenic E coli (ETEC) NOT DETECTED NOT DETECTED Final   Shiga like toxin producing E coli (STEC) NOT DETECTED NOT DETECTED Final   Shigella/Enteroinvasive E coli (EIEC) NOT DETECTED NOT DETECTED Final   Cryptosporidium NOT DETECTED NOT DETECTED Final   Cyclospora cayetanensis NOT DETECTED NOT DETECTED Final   Entamoeba histolytica NOT DETECTED NOT DETECTED Final   Giardia lamblia NOT DETECTED NOT DETECTED Final   Adenovirus F40/41 NOT DETECTED NOT DETECTED Final   Astrovirus NOT DETECTED NOT DETECTED Final   Norovirus GI/GII NOT DETECTED NOT DETECTED Final   Rotavirus A NOT DETECTED NOT DETECTED Final   Sapovirus (I, II, IV, and V) NOT DETECTED NOT DETECTED Final    Comment: Performed at [facility], [place] [address] [place], [place] [address]  C Difficile Quick Screen w PCR reflex     Status: Abnormal   Collection Time: 04/20/21 10:01 AM   Specimen: STOOL  Result Value Ref Range Status   C Diff antigen POSITIVE (A) NEGATIVE Final   C Diff toxin NEGATIVE NEGATIVE Final   C Diff interpretation Results are indeterminate. See PCR results.  Final    Comment: Performed at [facility],  [place] [address] [place], [place] [address]  C. Diff by PCR, Reflexed     Status: Abnormal   Collection Time: 04/20/21 10:01 AM  Result Value Ref Range Status   Toxigenic C. Difficile by PCR POSITIVE (A) NEGATIVE Final    Comment: Positive for toxigenic C. difficile with little to no toxin production. Only treat if clinical presentation suggests symptomatic illness. Performed at [facility], [place] [address], [place], [place] [address]    RN Pressure Injury Documentation:     Estimated body mass index is 25.66 kg/m as calculated from the following:   Height as of this encounter: 5' 6"  (1.676 m).   Weight as of this encounter: 72.1 kg.  Malnutrition Type:  Nutrition Problem: Inadequate oral intake Etiology: acute illness  Malnutrition Characteristics:  Signs/Symptoms: per patient/family report  Nutrition Interventions:     Radiology Studies: DG Chest 1 View  Result Date: 04/22/2021 CLINICAL DATA:  80 year old female with shortness of breath. EXAM: CHEST  1 VIEW COMPARISON:  Chest radiograph 10/30/2016 and earlier. CT Abdomen and Pelvis 04/19/2021. FINDINGS: Portable AP semi upright view at 0607 hours. Some artifact due to bilateral breast implants, but also veiling opacity at the left lung base in keeping with ongoing left pleural effusion demonstrated recently on CT Abdomen and Pelvis. No superimposed pneumothorax or pulmonary edema. No air bronchograms. Allowing for portable technique mediastinal contours appear stable since 2018. Visualized tracheal air column is within normal limits. Chronic left chest wall surgical clips. No acute osseous abnormality identified. IMPRESSION: 1. Ongoing small to moderate left pleural effusion seen recently on CT Abdomen and Pelvis. 2. Artifact from breast implants. No other acute cardiopulmonary abnormality. Electronically Signed   By: Genevie Ann M.D.   On: 04/22/2021 06:51    Scheduled Meds:  enoxaparin (LOVENOX) injection  40 mg Subcutaneous  Q24H   feeding supplement  1 Container Oral TID BM  FLUoxetine  20 mg Oral Daily   [START ON 04/23/2021] ipratropium-albuterol  3 mL Nebulization BID   multivitamin with minerals  1 tablet Oral Daily   predniSONE  5 mg Oral Q breakfast   vancomycin  125 mg Oral QID   Continuous Infusions:   LOS: 3 days   Kerney Elbe, DO [facility] PAGER is on AMION  If 7PM-7AM, please contact night-coverage [email or web address]

## 2021-04-22 NOTE — Plan of Care (Signed)
°  Problem: Clinical Measurements: Goal: Ability to maintain clinical measurements within normal limits will improve Outcome: Progressing   Problem: Nutrition: Goal: Adequate nutrition will be maintained Outcome: Progressing   Problem: Elimination: Goal: Will not experience complications related to bowel motility Outcome: Progressing   Problem: Coping: Goal: Level of anxiety will decrease Outcome: Progressing

## 2021-04-22 NOTE — Progress Notes (Signed)
Mobility Specialist - Progress Note    04/22/21 1349  Oxygen Therapy  SpO2 95 %  O2 Device Room Air  Mobility  Activity Ambulated in hall  Level of Assistance Standby assist, set-up cues, supervision of patient - no hands on  Assistive Device Front wheel walker  Distance Ambulated (ft) 300 ft  Mobility Ambulated with assistance in hallway  Mobility Response Tolerated well  Mobility performed by Mobility specialist  $Mobility charge 1 Mobility   Upon entry pt was on BSC brushing her teeth. She was agreeable to mobilize and required no assistance to stand. Requested to use RW, stating she has felt dizzier than usual. O2 sats recorded below. Pt was encouraged to practice pursed breathing after experiencing SOB. Pt returned to room and was left sitting up in recliner with call bell at side and family in room.   During Activity: 87HR, 95-97% SpO2 Post Activity: 73 HR, 97% SpO2  Sara Barnes Mobility Specialist Acute Rehabilitation Services Phone: [phone] 04/22/21, 1:52 PM

## 2021-04-23 ENCOUNTER — Encounter (HOSPITAL_COMMUNITY): Payer: Self-pay | Admitting: Internal Medicine

## 2021-04-23 ENCOUNTER — Other Ambulatory Visit (HOSPITAL_COMMUNITY): Payer: Self-pay

## 2021-04-23 ENCOUNTER — Inpatient Hospital Stay (HOSPITAL_COMMUNITY): Payer: Medicare HMO

## 2021-04-23 DIAGNOSIS — R112 Nausea with vomiting, unspecified: Secondary | ICD-10-CM | POA: Diagnosis not present

## 2021-04-23 DIAGNOSIS — F32A Depression, unspecified: Secondary | ICD-10-CM | POA: Diagnosis not present

## 2021-04-23 DIAGNOSIS — E871 Hypo-osmolality and hyponatremia: Secondary | ICD-10-CM | POA: Diagnosis not present

## 2021-04-23 DIAGNOSIS — K50919 Crohn's disease, unspecified, with unspecified complications: Secondary | ICD-10-CM | POA: Diagnosis not present

## 2021-04-23 LAB — CBC WITH DIFFERENTIAL/PLATELET
Abs Immature Granulocytes: 0.03 10*3/uL (ref 0.00–0.07)
Basophils Absolute: 0 10*3/uL (ref 0.0–0.1)
Basophils Relative: 1 %
Eosinophils Absolute: 0.1 10*3/uL (ref 0.0–0.5)
Eosinophils Relative: 1 %
HCT: 40.2 % (ref 36.0–46.0)
Hemoglobin: 13.4 g/dL (ref 12.0–15.0)
Immature Granulocytes: 0 %
Lymphocytes Relative: 18 %
Lymphs Abs: 1.3 10*3/uL (ref 0.7–4.0)
MCH: 31 pg (ref 26.0–34.0)
MCHC: 33.3 g/dL (ref 30.0–36.0)
MCV: 93.1 fL (ref 80.0–100.0)
Monocytes Absolute: 0.7 10*3/uL (ref 0.1–1.0)
Monocytes Relative: 10 %
Neutro Abs: 5.4 10*3/uL (ref 1.7–7.7)
Neutrophils Relative %: 70 %
Platelets: 229 10*3/uL (ref 150–400)
RBC: 4.32 MIL/uL (ref 3.87–5.11)
RDW: 12 % (ref 11.5–15.5)
WBC: 7.6 10*3/uL (ref 4.0–10.5)
nRBC: 0 % (ref 0.0–0.2)

## 2021-04-23 LAB — COMPREHENSIVE METABOLIC PANEL
ALT: 14 U/L (ref 0–44)
AST: 18 U/L (ref 15–41)
Albumin: 3.4 g/dL — ABNORMAL LOW (ref 3.5–5.0)
Alkaline Phosphatase: 67 U/L (ref 38–126)
Anion gap: 6 (ref 5–15)
BUN: 9 mg/dL (ref 8–23)
CO2: 28 mmol/L (ref 22–32)
Calcium: 8.6 mg/dL — ABNORMAL LOW (ref 8.9–10.3)
Chloride: 99 mmol/L (ref 98–111)
Creatinine, Ser: 0.93 mg/dL (ref 0.44–1.00)
GFR, Estimated: >60 mL/min (ref >60)
Glucose, Bld: 96 mg/dL (ref 70–99)
Potassium: 3.6 mmol/L (ref 3.5–5.1)
Sodium: 133 mmol/L — ABNORMAL LOW (ref 135–145)
Total Bilirubin: 0.4 mg/dL (ref 0.3–1.2)
Total Protein: 6.2 g/dL — ABNORMAL LOW (ref 6.5–8.1)

## 2021-04-23 LAB — PROTIME-INR
INR: 1 (ref 0.8–1.2)
Prothrombin Time: 13 seconds (ref 11.4–15.2)

## 2021-04-23 LAB — MAGNESIUM: Magnesium: 2 mg/dL (ref 1.7–2.4)

## 2021-04-23 LAB — PHOSPHORUS: Phosphorus: 4.8 mg/dL — ABNORMAL HIGH (ref 2.5–4.6)

## 2021-04-23 MED ORDER — FAMOTIDINE 20 MG PO TABS
20.0000 mg | ORAL_TABLET | Freq: Every day | ORAL | Status: DC
  Administered 2021-04-23 – 2021-04-24 (×2): 20 mg via ORAL
  Filled 2021-04-23 (×2): qty 1

## 2021-04-23 MED ORDER — FUROSEMIDE 10 MG/ML IJ SOLN
40.0000 mg | Freq: Once | INTRAMUSCULAR | Status: AC
  Administered 2021-04-23: 17:00:00 40 mg via INTRAVENOUS
  Filled 2021-04-23: qty 4

## 2021-04-23 NOTE — Progress Notes (Signed)
PROGRESS NOTE    Sara Barnes  IHK:[id number] DOB: [birth year] DOA: 04/19/2021 PCP: Ann Held, DO   Brief Narrative:  The patient is a 80 year old Caucasian female with a past medical history significant for but not limited to insomnia, Crohn's disease, depression, dyspepsia as well as other comorbidities who presented to the ED on 04/19/2021 with intractable nausea, vomiting and diarrhea.  Patient states for last 2 weeks she has been having multiple bowel movements daily with severe nausea and poor p.o. appetite and has been vomiting.  She is unable to tolerate much p.o. intake without vomiting.  She states that she feels like she is getting weaker and she does have a history of Crohn's disease and does have some chronic diarrhea but feels this is worse than her baseline.  At home she takes Imodium and Lomotil and is also on 5 mg of prednisone for Crohn's.  She follows up regularly with GI Dr. Hilarie Fredrickson and because her symptoms progressively got worse she presented to the ED for further evaluation.  On admission her vital signs were relatively stable and labs showed a sodium of 130.  Imaging obtained in the ED showed showed multiple hypodense hepatic masses however her bowels appeared fine.  In the ED she is given Dilaudid, Ativan, IV fluids, Zofran and the hospitalist service was consulted for further evaluation and management.  Further work-up reveals that she is positive for C. difficile toxigenic PCR and antigen so she is initially on C. difficile treatment and will be placed on probiotics as well.  Diarrhea is slowing down but she is still getting IV fluid hydration at 1.5 mils per hour.  She then developed shortness of breath with exertion and wheezing as well as left-sided rib cage pain.  Chest x-ray and EKG were ordered and that showed bilateral pleural effusions.  IV fluid hydration has been stopped and troponins were checked.  Plan is for a ultrasound biopsy of the liver that is  tentatively scheduled for 04/23/2021 pending any emergent IR cases however IR had to push it back to 04/24/2021.  Patient's diarrhea is improving and bowel movements are little bit more formed and she is feeling better.  Her shortness of breath is also resolved.  Now complaining of a headache.  GI is recommending completing 14-day course of oral vancomycin and following on her liver biopsy results.  They are recommending continue maintenance dose prednisone for Crohn's disease  Assessment & Plan:   Principal Problem:   Intractable nausea and vomiting Active Problems:   Depression   Diarrhea   Crohn disease (HCC)   Hyponatremia  Intractable nausea and vomiting associated with diarrhea in the setting of C. difficile diarrhea, improving slowly  -CT of the abdomen pelvis demonstrated multiple hypodense hepatic masses however her bowels appear to be fine -Unlikely a Crohn's flare given negative inflammation but given her symptoms but GI pathogen panel and C. difficile testing were ordered -C. difficile was positive antigen and PCR is positive for toxigenic C. difficile -Initiated on p.o. vancomycin -We will continue antiemetics with p.o./IV ondansetron every 6 as needed nausea -She received a normal saline bolus in the ED along with a lactated Ringer's 500 mL bolus and then placed on maintenance IV fluids with LR at 125 MLS per hour which we have now stopped given that she appears volume overloaded and became dyspneic -Continue with supportive care and initiate probiotics -Currently the patient is afebrile and has no white count and WBC is now 6.2 and  trended up slowly -> 6.4 -> 8.5 and today is 7.6 -We will hold her PPI and famotidine currently given her diarrhea -GI was consulted for further evaluation given her intractable nausea vomiting diarrhea and initially had placed her back on her antidiarrheals but will hold given her C. difficile being positive -Initiated on Vancomycin now and will  continue and her diarrhea is slowly improving and more formed.  She feels better from a diarrhea standpoint  Dyspnea with wheezing, improved -Likely in the setting of volume overload that she received too much IV fluid resuscitation -She received lactated Ringer's 125 MLS per hour for 2 days straight given her diarrhea and is now + 11.028 L L -Stopped IV fluid hydration -Initiated Xopenex and Atrovent and has albuterol as needed breathing treatments -Flutter valve, incentive spirometry and will give a dose of IV Lasix 40 mg x 1 yesterday and will give another dose today -Chest x-ray shows "Ongoing small to moderate left pleural effusion seen recently on CT Abdomen and Pelvis. Artifact from breast implants. No other acute cardiopulmonary abnormality." -SpO2: 96 %; Not on O2 anymore  -Continue to Monitor closely   Chest Discomfort improved and resolved -In the setting of Volume overload and Effusions -EKG unrevealing and Troponin Flat -Continue to Monitor closely   Crohn's Disease -Unclear if this is likely related to her current presentation -Resumed 5 mg of p.o. prednisone -GI initiated back on her antidiarrheals but will hold given her C. difficile positive.  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 Truitt Merle via [facility] is planning on doing an ultrasound-guided biopsy on 04/16/2021 now  Hypokalemia -Patient's potassium level is now 3.6 -Replete with p.o. potassium chloride 40 mg twice daily x2 doses -Continue to monitor and replete as necessary and repeat CMP in a.m.  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 Morey Hummingbird in outpatient setting and she had an appointment in April 2022 and is recommended to proceed with a diagnostic mammogram proceed with a PET scan.  Patient did not have the PET scan and did not follow-up with the office at that time  DVT prophylaxis: Enoxaparin 40 mg sq q24h Code Status: FULL CODE  Family Communication: Full code Disposition Plan: Pending further clinical improvement  Status is: Inpatient  Remains inpatient appropriate  because: Patient continues have significant diarrhea and is extremely weak from the C Diff  Consultants:  Gastroenterology  Interventional Radiology Medical Oncology   Procedures:  None  Antimicrobials:  Anti-infectives (From admission, onward)    Start     Dose/Rate Route Frequency Ordered Stop   04/20/21 1500  vancomycin (VANCOCIN) capsule 125 mg        125 mg Oral 4 times daily 04/20/21 1405 04/30/21 1359       Subjective: The patient was seen and examined at bedside and she is complaining of a headache.  Denied any chest pain or shortness of breath and states that her breathing is improved.  Also states that  her diarrhea is improving and is having more solid stools and less frequent bowel movements.  Denies any lightheadedness or dizziness but complains of this headache.  Also had some nausea earlier.  No other concerns or complaints at this time.  Objective: Vitals:   04/22/21 2002 04/22/21 2144 04/23/21 0644 04/23/21 1211  BP:  (!) 153/78 138/72 137/72  Pulse:  64 73 73  Resp:  18 18 18   Temp:  98.5 F (36.9 C) 98.5 F (36.9 C) 97.9 F (36.6 C)  TempSrc:      SpO2: 94% 97% 95% 96%  Weight:      Height:        Intake/Output Summary (Last 24 hours) at 04/23/2021 1637 Last data filed at 04/23/2021 1400 Gross per 24 hour  Intake 2075 ml  Output --  Net 2075 ml    Filed Weights   04/19/21 2130  Weight: 72.1 kg   Examination: Physical Exam:  Constitutional: WN/WD overweight Caucasian female currently no acute distress seen in the chair bedside with complaint of a headache Eyes: Lids and conjunctivae normal, sclerae anicteric  ENMT: External Ears, Nose appear normal. Grossly normal hearing. Mucous membranes are moist.  Neck: Appears normal, supple, no cervical masses, normal ROM, no appreciable thyromegaly; no appreciable JVD Respiratory: Diminished to auscultation bilaterally with coarse breath sounds and some crackles at the bases., no wheezing, rales, rhonchi. Normal respiratory effort and patient is not tachypenic. No accessory muscle use.  Unlabored breathing not wearing supplemental oxygen nasal cannula Cardiovascular: RRR, no murmurs / rubs / gallops. S1 and S2 auscultated.  Trace extremity edema.  Abdomen: Soft, non-tender, slightly distended secondary body habitus.  Bowel sounds positive.  GU: Deferred. Musculoskeletal: No clubbing / cyanosis of digits/nails. No joint deformity upper and lower extremities.  Skin: No rashes, lesions, ulcers on limited skin evaluation. No induration; Warm and dry.  Neurologic: CN 2-12 grossly intact with no focal deficits. Romberg sign and  cerebellar reflexes not assessed.  Psychiatric: Normal judgment and insight. Alert and oriented x 3.  Mildly anxious mood and appropriate affect.   Data Reviewed: I have personally reviewed following labs and imaging studies  CBC: Recent Labs  Lab 04/19/21 1143 04/20/21 0439 04/21/21 0458 04/22/21 0407 04/23/21 0415  WBC 8.7 6.2 6.4 8.5 7.6  NEUTROABS  --   --  4.2 5.9 5.4  HGB 15.7* 12.8 13.0 12.8 13.4  HCT 45.5 38.2 38.0 38.4 40.2  MCV 92.7 93.6 93.6 94.8 93.1  PLT 279 213 213 200 938    Basic Metabolic Panel: Recent Labs  Lab 04/19/21 1143 04/20/21 0439 04/21/21 0458 04/22/21 0407 04/23/21 0415  NA 130* 134* 134* 133* 133*  K 3.9 3.8 3.4* 4.4 3.6  CL 99 104 103 102 99  CO2 22 23 25 26 28   GLUCOSE 113*  85 92 90 96  BUN 10 7* 8 9 9   CREATININE 0.89 0.76 0.73 0.85 0.93  CALCIUM 9.0 8.4* 8.5* 8.6* 8.6*  MG  --  1.8 1.9 2.2 2.0  PHOS  --  3.4 3.3 3.4 4.8*    GFR: Estimated Creatinine Clearance: 49.9 mL/min (by C-G formula based on SCr of 0.93 mg/dL). Liver Function Tests: Recent Labs  Lab 04/19/21 1143 04/20/21 0439 04/21/21 0458 04/22/21 0407 04/23/21 0415  AST 21 18 16 16 18   ALT 15 14 13 11 14   ALKPHOS 97 66 70 65 67  BILITOT 0.8 0.7 0.5 0.6 0.4  PROT 6.9 5.3* 5.4* 5.5* 6.2*  ALBUMIN 3.7 2.8* 2.9* 2.9* 3.4*    Recent Labs  Lab 04/19/21 1143  LIPASE 20    No results for input(s): AMMONIA in the last 168 hours. Coagulation Profile: Recent Labs  Lab 04/23/21 0415  INR 1.0   Cardiac Enzymes: No results for input(s): CKTOTAL, CKMB, CKMBINDEX, TROPONINI in the last 168 hours. BNP (last 3 results) No results for input(s): PROBNP in the last 8760 hours. HbA1C: No results for input(s): HGBA1C in the last 72 hours. CBG: No results for input(s): GLUCAP in the last 168 hours. Lipid Profile: No results for input(s): CHOL, HDL, LDLCALC, TRIG, CHOLHDL, LDLDIRECT in the last 72 hours. Thyroid Function Tests: No results for input(s): TSH, T4TOTAL,  FREET4, T3FREE, THYROIDAB in the last 72 hours. Anemia Panel: No results for input(s): VITAMINB12, FOLATE, FERRITIN, TIBC, IRON, RETICCTPCT in the last 72 hours. Sepsis Labs: No results for input(s): PROCALCITON, LATICACIDVEN in the last 168 hours.  Recent Results (from the past 240 hour(s))  Resp Panel by RT-PCR (Flu A&B, Covid) Nasopharyngeal Swab     Status: None   Collection Time: 04/19/21 12:48 PM   Specimen: Nasopharyngeal Swab; Nasopharyngeal(NP) swabs in vial transport medium  Result Value Ref Range Status   SARS Coronavirus 2 by RT PCR NEGATIVE NEGATIVE Final    Comment: (NOTE) SARS-CoV-2 target nucleic acids are NOT DETECTED.  The SARS-CoV-2 RNA is generally detectable in upper respiratory specimens during the acute phase of infection. The lowest concentration of SARS-CoV-2 viral copies this assay can detect is 138 copies/mL. A negative result does not preclude SARS-Cov-2 infection and should not be used as the sole basis for treatment or other patient management decisions. A negative result may occur with  improper specimen collection/handling, submission of specimen other than nasopharyngeal swab, presence of viral mutation(s) within the areas targeted by this assay, and inadequate number of viral copies(<138 copies/mL). A negative result must be combined with clinical observations, patient history, and epidemiological information. The expected result is Negative.  Fact Sheet for Patients:  EntrepreneurPulse.com.au  Fact Sheet for Healthcare Providers:  IncredibleEmployment.be  This test is no t yet approved or cleared by the [place] FDA and  has been authorized for detection and/or diagnosis of SARS-CoV-2 by FDA under an Emergency Use Authorization (EUA). This EUA will remain  in effect (meaning this test can be used) for the duration of the COVID-19 declaration under Section 564(b)(1) of the Act, 21 U.S.C.section  360bbb-3(b)(1), unless the authorization is terminated  or revoked sooner.       Influenza A by PCR NEGATIVE NEGATIVE Final   Influenza B by PCR NEGATIVE NEGATIVE Final    Comment: (NOTE) The Xpert Xpress SARS-CoV-2/FLU/RSV plus assay is intended as an aid in the diagnosis of influenza from Nasopharyngeal swab specimens and should not be used as a sole basis for treatment.  Nasal washings and aspirates are unacceptable for Xpert Xpress SARS-CoV-2/FLU/RSV testing.  Fact Sheet for Patients: EntrepreneurPulse.com.au  Fact Sheet for Healthcare Providers: IncredibleEmployment.be  This test is not yet approved or cleared by the [place] FDA and has been authorized for detection and/or diagnosis of SARS-CoV-2 by FDA under an Emergency Use Authorization (EUA). This EUA will remain in effect (meaning this test can be used) for the duration of the COVID-19 declaration under Section 564(b)(1) of the Act, 21 U.S.C. section 360bbb-3(b)(1), unless the authorization is terminated or revoked.  Performed at [facility], [place] [address], [place], [place] [address]   Urine Culture     Status: None   Collection Time: 04/19/21 12:56 PM   Specimen: Urine, Clean Catch  Result Value Ref Range Status   Specimen Description   Final    URINE, CLEAN CATCH Performed at [facility], [place] [address], [place], [place] [address]    Special Requests   Final    NONE Performed at [facility], [place] [address], [place], [place] [address]    Culture   Final    NO GROWTH Performed at [facility], [place] [address], [place], [place] [address]    Report Status 04/20/2021 FINAL  Final  Gastrointestinal Panel by PCR , Stool     Status: None   Collection Time: 04/20/21 10:01 AM   Specimen: STOOL  Result Value Ref Range Status   Campylobacter species NOT DETECTED NOT DETECTED Final   Plesimonas shigelloides NOT  DETECTED NOT DETECTED Final   Salmonella species NOT DETECTED NOT DETECTED Final   Yersinia enterocolitica NOT DETECTED NOT DETECTED Final   Vibrio species NOT DETECTED NOT DETECTED Final   Vibrio cholerae NOT DETECTED NOT DETECTED Final   Enteroaggregative E coli (EAEC) NOT DETECTED NOT DETECTED Final   Enteropathogenic E coli (EPEC) NOT DETECTED NOT DETECTED Final   Enterotoxigenic E coli (ETEC) NOT DETECTED NOT DETECTED Final   Shiga like toxin producing E coli (STEC) NOT DETECTED NOT DETECTED Final   Shigella/Enteroinvasive E coli (EIEC) NOT DETECTED NOT DETECTED Final   Cryptosporidium NOT DETECTED NOT DETECTED Final   Cyclospora cayetanensis NOT DETECTED NOT DETECTED Final   Entamoeba histolytica NOT DETECTED NOT DETECTED Final   Giardia lamblia NOT DETECTED NOT DETECTED Final   Adenovirus F40/41 NOT DETECTED NOT DETECTED Final   Astrovirus NOT DETECTED NOT DETECTED Final   Norovirus GI/GII NOT DETECTED NOT DETECTED Final   Rotavirus A NOT DETECTED NOT DETECTED Final   Sapovirus (I, II, IV, and V) NOT DETECTED NOT DETECTED Final    Comment: Performed at [facility], [place] [place], [place] [address]  C Difficile Quick Screen w PCR reflex     Status: Abnormal   Collection Time: 04/20/21 10:01 AM   Specimen: STOOL  Result Value Ref Range Status   C Diff antigen POSITIVE (A) NEGATIVE Final   C Diff toxin NEGATIVE NEGATIVE Final   C Diff interpretation Results are indeterminate. See PCR results.  Final    Comment: Performed at [facility], [place] [address], [place], [place] [address]  C. Diff by PCR, Reflexed     Status: Abnormal   Collection Time: 04/20/21 10:01 AM  Result Value Ref Range Status   Toxigenic C. Difficile by PCR POSITIVE (A) NEGATIVE Final    Comment: Positive for toxigenic C. difficile with little to no toxin production. Only treat if clinical presentation suggests symptomatic illness. Performed  at [facility], [place] [address], [place], [place] [address]    RN Pressure Injury Documentation:     Estimated body mass index is 25.66 kg/m as calculated from the following:   Height as of this encounter: 5' 6"  (1.676 m).   Weight as of this encounter: 72.1 kg.  Malnutrition Type:  Nutrition Problem: Inadequate oral intake Etiology: acute illness  Malnutrition Characteristics:  Signs/Symptoms: per patient/family report  Nutrition Interventions:     Radiology Studies: DG Chest 1 View  Result Date: 04/22/2021 CLINICAL DATA:  80 year old female with shortness of breath. EXAM: CHEST  1 VIEW COMPARISON:  Chest radiograph 10/30/2016 and earlier. CT Abdomen and Pelvis 04/19/2021. FINDINGS: Portable AP semi upright view at 0607 hours. Some artifact due to bilateral breast implants, but also veiling opacity at the left lung base in keeping with ongoing left pleural effusion demonstrated recently on CT Abdomen and Pelvis. No superimposed pneumothorax or pulmonary edema. No air bronchograms. Allowing for portable technique mediastinal contours appear stable since 2018. Visualized tracheal air column is within normal limits. Chronic left chest wall surgical clips. No acute osseous abnormality identified. IMPRESSION: 1. Ongoing small to moderate left pleural effusion seen recently on CT Abdomen and Pelvis. 2. Artifact from breast implants. No other acute cardiopulmonary abnormality. Electronically Signed   By: Genevie Ann M.D.   On: 04/22/2021 06:51   DG CHEST PORT 1 VIEW  Result Date: 04/23/2021 CLINICAL DATA:  Shortness of breath EXAM: PORTABLE CHEST 1 VIEW COMPARISON:  04/22/2021 FINDINGS: Transverse diameter of heart is increased. There is diffuse haziness in the left mid and left lower lung fields. This may be due to moderate left pleural effusion. Evaluation of left mid and left lower lung fields for infiltrates is limited by the effusion. Right lung is clear. Right lateral CP angle is clear. There is no pneumothorax.  IMPRESSION: No significant interval changes are noted in the homogeneous opacity in the left mid and left lower lung fields suggesting moderate left pleural effusion. Possibility of underlying atelectasis/pneumonitis in the left mid and left lower lung fields is not excluded. Electronically Signed   By: Elmer Picker M.D.   On: 04/23/2021 10:03    Scheduled Meds:  [START ON 04/25/2021] enoxaparin (LOVENOX) injection  40 mg Subcutaneous Daily   famotidine  20 mg Oral QHS   feeding supplement  1 Container Oral TID BM   FLUoxetine  20 mg Oral Daily   ipratropium-albuterol  3 mL Nebulization BID   multivitamin with minerals  1 tablet Oral Daily   predniSONE  5 mg Oral Q breakfast   vancomycin  125 mg Oral QID   Continuous Infusions:   LOS: 4 days   Kerney Elbe, DO [facility] PAGER is on AMION  If 7PM-7AM, please contact night-coverage [email or web address]

## 2021-04-23 NOTE — Progress Notes (Addendum)
Progress Note   Subjective  Chief Complaint: Crohn's disease, liver lesions  Patient states had 2 bowel movements yesterday, 1 this morning.  BM this morning was a little bit more formed.  Patient states her entire abdomen slightly sore, continuing lower back pain. Patient is having continued nausea, has not had any vomiting at this time.  States Zofran is not helping. Wants to know if nausea is potentially from the ABX since she is "sensitive" to these things.  She is complaining that she woke up this morning with bilateral throbbing temporal pain, denies sensitivity to light and sound.  States even her nose is tender and she has sinus pressure.   Objective   Vital signs in last 24 hours: Temp:  [97.8 F (36.6 C)-98.5 F (36.9 C)] 98.5 F (36.9 C) (12/27 0644) Pulse Rate:  [64-73] 73 (12/27 0644) Resp:  [18-20] 18 (12/27 0644) BP: (138-153)/(59-78) 138/72 (12/27 0644) SpO2:  [94 %-97 %] 95 % (12/27 0644) Last BM Date: 04/23/21  General:   female in no acute distress, appears to be uncomfortable Heart:  regular rate and rhythm, no murmurs or gallops Pulm: Clear anteriorly; no wheezing Abdomen:  Soft, Flat AB, skin examNormal bowel sounds. mild tenderness in the entire abdomen. Without guarding and Without rebound Extremities:  Without edema. Neurologic:  Alert and  oriented x4;  grossly normal neurologically.  Skin:   Dry and intact without significant lesions or rashes. Psychiatric: Cooperative. Normal mood and affect.  Intake/Output from previous day: 12/26 0701 - 12/27 0700 In: 360 [P.O.:360] Out: -  Intake/Output this shift: No intake/output data recorded.  Lab Results: Recent Labs    04/21/21 0458 04/22/21 0407 04/23/21 0415  WBC 6.4 8.5 7.6  HGB 13.0 12.8 13.4  HCT 38.0 38.4 40.2  PLT 213 200 229   BMET Recent Labs    04/21/21 0458 04/22/21 0407 04/23/21 0415  NA 134* 133* 133*  K 3.4* 4.4 3.6  CL 103 102 99  CO2 25 26 28   GLUCOSE 92 90 96  BUN 8  9 9   CREATININE 0.73 0.85 0.93  CALCIUM 8.5* 8.6* 8.6*   LFT Recent Labs    04/23/21 0415  PROT 6.2*  ALBUMIN 3.4*  AST 18  ALT 14  ALKPHOS 67  BILITOT 0.4   PT/INR Recent Labs    04/23/21 0415  LABPROT 13.0  INR 1.0    Studies/Results: DG Chest 1 View  Result Date: 04/22/2021 CLINICAL DATA:  80 year old female with shortness of breath. EXAM: CHEST  1 VIEW COMPARISON:  Chest radiograph 10/30/2016 and earlier. CT Abdomen and Pelvis 04/19/2021. FINDINGS: Portable AP semi upright view at 0607 hours. Some artifact due to bilateral breast implants, but also veiling opacity at the left lung base in keeping with ongoing left pleural effusion demonstrated recently on CT Abdomen and Pelvis. No superimposed pneumothorax or pulmonary edema. No air bronchograms. Allowing for portable technique mediastinal contours appear stable since 2018. Visualized tracheal air column is within normal limits. Chronic left chest wall surgical clips. No acute osseous abnormality identified. IMPRESSION: 1. Ongoing small to moderate left pleural effusion seen recently on CT Abdomen and Pelvis. 2. Artifact from breast implants. No other acute cardiopulmonary abnormality. Electronically Signed   By: Genevie Ann M.D.   On: 04/22/2021 06:51       Impression/plan   80 y.o. female with longstanding complicated Crohn's disease, severe perianal disease s/p cecal perforation in 2019 and underwent right hemicolectomy and end ileostomy with subsequent ileostomy  reversal surgery June 2020.   On chronic Prednisone, Lomotil and Imodium for IBD therapy.   She was admitted to the hospital 04/19/2021 worsening diarrhea due to C. Difficile,  less likely Crohn's flare.  No recent antibiotics. C. Diff antigen +, C. Diff toxin negative.  Toxigenic C. difficile PCR positive. Started on [place] on low dose Prednisone. Diarrhea has decreased.  WBC 7.6 HGB 13.4 MCV 93.1 Platelets 229 -Greatly decreased number of bowel movements  daily -Continue Vancomycin 125 mg p.o. 4 times daily for at least 14 day course- DAY 4 today -Continue low-dose Prednisone for now (maintenance dose for Crohn's disease) -Florastor probiotic one po bid -Diet as tolerated -BMP and CBC in am  Nausea Was on pepcid as needed outpatient Can restart this to potentially help nausea/protect stomach Zofran as needed q 6 hours, last given 730 AM, continue Likely multifactorial, could be from vancomycin, if not able to be controlled with medications can consider switch to dificin.   History of breast cancer 2016 s/p laminectomy. 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 She followed with her oncology team 07/2020 who recommended a PET scan to check for other sites of metastasis however she declined and has not been back to see them since then.  CTAP 04/19/2021 showed new masses in her liver and also bilateral pleural effusions.   -Liver biopsy ordered by the medical team- pending today Patient states she will not be able to go to get the biopsy if she has HA and nausea like she does now.  Headache No gross neuro deficits, CT head 11/2020 unremarkable Given diluadid this AM 730 with some help of the pain Throbbing bilateral temporal with sinus pressure Further recommendations per primary team    LOS: 4 days   Vladimir Crofts  04/23/2021, 8:45 AM   Attending physician's note   I have taken an interval history, reviewed the chart and examined the patient. I agree with the Advanced Practitioner's note, impression and recommendations.   Diarrhea is improving on oral vancomycin.  She is feeling better slightly Plan to complete 14-day course of oral vancomycin  She is scheduled for liver biopsy tomorrow for new liver lesions and left pleural effusion concerning for possible metastatic disease, further work-up pending biopsies.  Continue maintenance dose  prednisone for Crohn's disease.  The patient was provided an opportunity to ask questions and all were answered. The patient agreed with the plan and demonstrated an understanding of the instructions.  Damaris Hippo , MD [phone]      Attending physician's note   I have taken an interval history, reviewed the chart and examined the patient. I agree with the Advanced Practitioner's note, impression and recommendations.   Diarrhea is improving on vancomycin, complete 14-day course of oral vancomycin She underwent liver biopsy, results pending  GI will sign off, available if have any questions    The patient was provided an opportunity to ask questions and all were answered. The patient agreed with the plan and demonstrated an understanding of the instructions.  Damaris Hippo , MD [phone]

## 2021-04-23 NOTE — Progress Notes (Signed)
OT Cancellation Note  Patient Details Name: Sara Barnes MRN: [id number] DOB: 11-07-1940   Cancelled Treatment:    Reason Eval/Treat Not Completed: Patient declined, no reason specified Patient reported nausea this AM with anti nausea medications on board. Then patient was approached fwe hours later with patient reporting onset of head ache. Patient asking for therapy to come back this afternoon. Patient noted to have surgery scheduled for this afternoon as well. OT will check back as schedule allows.  Jackelyn Poling OTR/L, [place] [facility] Office# [phone] Pager# [phone]   04/23/2021, 9:58 AM

## 2021-04-23 NOTE — Progress Notes (Addendum)
HEMATOLOGY-ONCOLOGY PROGRESS NOTE  ASSESSMENT AND PLAN: 1.  C. Difficile 2.  Intractable nausea, vomiting, diarrhea 3.  History of left breast cancer 4.  Liver lesions 5.  Crohn's disease  -The patient is being followed by GI for C. difficile and her Crohn's disease.  Symptoms are slowly improving. -The patient has a history of left breast cancer with confirmed recurrence to her skin earlier this year.  She has not followed up with oncology as recommended.  Now has liver lesions concerning for hepatic metastatic disease.  I discussed the CT findings with the patient.  Agree with proceeding with ultrasound-guided liver biopsy to confirm recurrence.  Further recommendations regarding treatment options pending biopsy results per Dr. Burr Medico.   Mikey Bussing, DNP, AGPCNP-BC, AOCNP  SUBJECTIVE: Ms. Castles has been followed by our office for breast cancer.  She was originally diagnosed in 2016.  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  At her last appointment in April 2022, it was recommended for her to proceed with a diagnostic mammogram and to proceed with [place] PET scan.  The patient did not have the PET scan and has not followed up with our office.  There have been multiple phone messages reaching out to the patient and she has declined the scan and follow-up.  She is now admitted with intractable nausea, vomiting, diarrhea.  He is being treated for C. difficile.  CT abdomen/pelvis performed on admission showed multiple hypodense hepatic masses are highly suspicious for metastatic disease as well as a moderate left and small right pleural effusion.  I met with the patient in her hospital room this morning.  She reports a headache today.  States that she has a lot of sinus congestion.  She also reports nausea this morning but no vomiting.  She is due to have an  ultrasound-guided liver biopsy either later today or tomorrow.  Oncology History Overview Note  Cancer of lower-inner quadrant of left female breast [facility])   Staging form: Breast, AJCC 7th Edition     Clinical stage from 12/06/2014: Stage IA (T1c, N0, M0) - Unsigned     Pathologic stage from 12/13/2014: Stage IA (T1c, N0, cM0) - Unsigned      Cancer of lower-inner quadrant of left female breast ([place])  11/28/2014 Breast US   Highly suspicious 1.6 cm irregular shadowing mass at the site of palpable concern in the subareolar lower inner quadrant of the left breast at approximately 630.   11/29/2014 Receptors her2   Estrogen Receptor: 100%, POSITIVE, STRONG STAINING INTENSITY (PERFORMED MANUALLY) Progesterone Receptor: 95%, POSITIVE, STRONG STAINING INTENSITY (PERFORMED MANUALLY) Proliferation Marker Ki67: 10% (PERFORMED MANUALLY), Her2neu negative   11/29/2014 Initial Biopsy   Breast, left, needle core biopsy, subareolar about 6:30 - INVASIVE DUCTAL CARCINOMA, SEE COMMENT.   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   02/05/2015 - 02/26/2015 Radiation Therapy   left breast radiaiton    03/18/2015 -  Anti-estrogen oral therapy   She tried letrozole  for 2-3 weeks, could not tolerate and stopped.   07/25/2020 Pathology Results   FINAL MICROSCOPIC DIAGNOSIS: A. SKIN, LEFT CHEST, EXCISION: - Invasive carcinoma involving dermis and subcutaneous soft tissue, consistent with patient's clinical history of invasive ductal carcinoma, see comment - Deep and lateral resection margins are negative  for carcinoma  Appears grade I  ADDENDUM: PROGNOSTIC INDICATOR RESULTS: Immunohistochemical and morphometric analysis performed manually The tumor cells are NEGATIVE for Her2 (1+). Estrogen Receptor: POSITIVE, 90%, STRONG STAINING Progesterone Receptor: POSITIVE, 95%, STRONG STAINING Proliferation Marker Ki-67: 5%       REVIEW OF SYSTEMS:   Review of Systems  Constitutional:  Positive for malaise/fatigue and weight loss. Negative for fever.  HENT:  Positive for congestion and hearing loss.   Respiratory:  Negative for cough and shortness of breath.   Cardiovascular:  Negative for chest pain.  Gastrointestinal:  Positive for diarrhea and nausea. Negative for vomiting.  Musculoskeletal:  Positive for back pain.  Skin:  Positive for rash.       Continued rash to her left breast  Neurological:  Positive for headaches.  Psychiatric/Behavioral: Negative.     I have reviewed the past medical history, past surgical history, social history and family history with the patient and they are unchanged from previous note.   PHYSICAL EXAMINATION: ECOG PERFORMANCE STATUS: 1 - Symptomatic but completely ambulatory  Vitals:   04/22/21 2144 04/23/21 0644  BP: (!) 153/78 138/72  Pulse: 64 73  Resp: 18 18  Temp: 98.5 F (36.9 C) 98.5 F (36.9 C)  SpO2: 97% 95%   Filed Weights   04/19/21 2130  Weight: 72.1 kg    Intake/Output from previous day: 12/26 0701 - 12/27 0700 In: 360 [P.O.:360] Out: -   Physical Exam Constitutional:      General: She is not in acute distress.    Appearance: Normal appearance.  HENT:     Head: Normocephalic.  Cardiovascular:     Rate and Rhythm: Normal rate.  Pulmonary:     Effort: Pulmonary effort is normal. No respiratory distress.  Abdominal:     General: Abdomen is flat. There is no distension.  Skin:    General: Skin is warm and dry.     Findings: Rash present.     Comments: Faint rash to left breast  Neurological:     General: No focal  deficit present.     Mental Status: She is alert and oriented to person, place, and time.  Psychiatric:        Mood and Affect: Mood normal.        Thought Content: Thought content normal.        Judgment: Judgment normal.    LABORATORY DATA:  I have reviewed the data as listed CMP Latest Ref Rng & Units 04/23/2021 04/22/2021 04/21/2021  Glucose 70 - 99 mg/dL 96 90 92  BUN 8 - 23 mg/dL _0 Creatinine 0.44 - 1.00 mg/dL 0.93 0.85 0.73  Sodium 135 - 145 mmol/L 133(L) 133(L) 134(L)  Potassium 3.5 - 5.1 mmol/L 3.6 4.4 3.4(L)  Chloride 98 - 111 mmol/L 99 102 103  CO2 22 - 32 mmol/L _1 Calcium 8.9 - 10.3 mg/dL 8.6(L) 8.6(L) 8.5(L)  Total Protein 6.5 - 8.1 g/dL 6.2(L) 5.5(L) 5.4(L)  Total Bilirubin 0.3 - 1.2 mg/dL 0.4 0.6 0.5  Alkaline Phos 38 - 126 U/L 67 65 70  AST 15 - 41 U/L _2 ALT 0 - 44 U/L 14  11 13    Lab Results  Component Value Date   WBC 7.6 04/23/2021   HGB 13.4 04/23/2021   HCT 40.2 04/23/2021   MCV 93.1 04/23/2021   PLT 229 04/23/2021   NEUTROABS 5.4 04/23/2021    No results found for: CEA1, CEA, CAN199, CA125, PSA1  DG Chest 1 View  Result Date: 04/22/2021 CLINICAL DATA:  80 year old female with shortness of breath. EXAM: CHEST  1 VIEW COMPARISON:  Chest radiograph 10/30/2016 and earlier. CT Abdomen and Pelvis 04/19/2021. FINDINGS: Portable AP semi upright view at 0607 hours. Some artifact due to bilateral breast implants, but also veiling opacity at the left lung base in keeping with ongoing left pleural effusion demonstrated recently on CT Abdomen and Pelvis. No superimposed pneumothorax or pulmonary edema. No air bronchograms. Allowing for portable technique mediastinal contours appear stable since 2018. Visualized tracheal air column is within normal limits. Chronic left chest wall surgical clips. No acute osseous abnormality identified. IMPRESSION: 1. Ongoing small to moderate left pleural effusion seen recently on CT Abdomen and Pelvis. 2.  Artifact from breast implants. No other acute cardiopulmonary abnormality. Electronically Signed   By: Genevie Ann M.D.   On: 04/22/2021 06:51   CT ABDOMEN PELVIS W CONTRAST  Result Date: 04/19/2021 CLINICAL DATA:  Nausea, vomiting, abdominal pain, acute nonlocalized. History of left breast with lumpectomy and radiation. EXAM: CT ABDOMEN AND PELVIS WITH CONTRAST TECHNIQUE: Multidetector CT imaging of the abdomen and pelvis was performed using the standard protocol following bolus administration of intravenous contrast. CONTRAST:  25m OMNIPAQUE IOHEXOL 350 MG/ML SOLN COMPARISON:  CT examination dated November 11, 2019 FINDINGS: Lower chest: Moderate left and small right pleural effusion with left basilar atelectasis. Small hiatal hernia. Hepatobiliary: At least 3 hyperdense masses in the liver. In the left hepatic lobe measuring 2.2 x 2.2 cm (series 2 image 9). Another hypodense structure abutting the diaphragm measuring 1.4 x 1.6 cm (series 2, image 8). Another hyperdense structure in the inferior aspect of the right hepatic lobe (series 2, image 20). These masses are highly suspicious for metastatic disease until proven otherwise. Gallbladder is unremarkable. No biliary ductal dilatation. Pancreas: Unremarkable. No pancreatic ductal dilatation or surrounding inflammatory changes. Spleen: Normal in size without focal abnormality. Adrenals/Urinary Tract: Adrenal glands are unremarkable. Kidneys are normal, without renal calculi, focal lesion, or hydronephrosis. Bladder is unremarkable. Stomach/Bowel: Stomach is within normal limits. Small bowel loops are normal in caliber. Postsurgical changes for prior hemicolectomy. No evidence of bowel wall thickening, distention, or inflammatory changes. Vascular/Lymphatic: Aortic atherosclerosis. No enlarged abdominal or pelvic lymph nodes. Reproductive: Status post hysterectomy. No adnexal masses. Other: No abdominal wall hernia or abnormality. No abdominopelvic ascites.  Musculoskeletal: Mild multilevel degenerative disc disease. No acute osseous abnormality. IMPRESSION: 1. Multiple hypodense hepatic masses, as detailed above, highly suspicious for metastatic disease. Differential include infectious/inflammatory process. Further evaluation with MRI examination with contrast would be helpful. 2. Moderate left and small right pleural effusion. 3. Postsurgical changes for prior hemicolectomy. No bowel obstruction, colitis or diverticulitis. Electronically Signed   By: IKeane PoliceD.O.   On: 04/19/2021 16:13   DG CHEST PORT 1 VIEW  Result Date: 04/23/2021 CLINICAL DATA:  Shortness of breath EXAM: PORTABLE CHEST 1 VIEW COMPARISON:  04/22/2021 FINDINGS: Transverse diameter of heart is increased. There is diffuse haziness in the left mid and left lower lung fields. This may be due to moderate left pleural effusion. Evaluation of left mid and left lower lung fields for infiltrates is limited by the  effusion. Right lung is clear. Right lateral CP angle is clear. There is no pneumothorax. IMPRESSION: No significant interval changes are noted in the homogeneous opacity in the left mid and left lower lung fields suggesting moderate left pleural effusion. Possibility of underlying atelectasis/pneumonitis in the left mid and left lower lung fields is not excluded. Electronically Signed   By: Elmer Picker M.D.   On: 04/23/2021 10:03     Future Appointments  Date Time Provider [place]  04/30/2021 11:00 AM Ann Held, DO [facility]      LOS: 4 days   Mikey Bussing, [place], AGPCNP-BC, AOCNP 04/23/21   Addendum  I have seen the patient, examined her. I agree with the assessment and and plan and have edited the notes.   I saw pt after her liver biopsy. She has intermittent nausea and vomiting, and complains of severe epigastric pain, she did not want talk much today.  I spoke with her sister at bedside.  I have reviewed her recent labs and CT scan images,  and discussed the findings with patient and her sister.  This is highly concerning for metastatic cancer, likely from her previous breast cancer.  Liver biopsy was obtained today, results are pending.  We will repeat the ER, PR and HER2 and FO on her biopsy sample if her biopsy is consistent with breast cancer.  Patient previously lost follow-up and did not want to treatment, her sister will encourage her to reconsider treatment this time.  I briefly reviewed systemic treatment options for metastatic breast cancer, especially oral antiestrogen therapy or fulvestrant injection with CDK4/6 inhibitor such as Ibrance. She is not a good candidate for chemotherapy.  I will follow-up her when her biopsy result is back. I will order a CT chest wo contrast to complete her staging, will get this done before discharge. All questions were answered.   Truitt Merle  04/24/2021

## 2021-04-23 NOTE — Progress Notes (Signed)
PT Cancellation Note  Patient Details Name: VALINDA FEDIE MRN: [id number] DOB: 12-13-1940   Cancelled Treatment:    Reason Eval/Treat Not Completed: Medical issues which prohibited therapy;Pain limiting ability to participate.  [place] Pager [phone] Office [phone]    Claretha Cooper 04/23/2021, 12:26 PM

## 2021-04-23 NOTE — Progress Notes (Signed)
Patient is refusing Tylenol saying that it "stops her from peeing all together." Pt stated she only wants her "laddi"

## 2021-04-23 NOTE — Care Management Important Message (Signed)
Important Message  Patient Details IM Letter placed in Patients room. Name: Sara Barnes MRN: [id number] Date of Birth: February 03, 1941   Medicare Important Message Given:  Yes     Kerin Salen 04/23/2021, 4:07 PM

## 2021-04-23 NOTE — TOC Benefit Eligibility Note (Signed)
Patient Advocate Encounter  Prior Authorization for Vancomycin 125 mg capsules has been approved.    PA# [id number] Effective dates: 04/23/2021 through 04/27/2022  Patients co-pay is $95.00.     Lyndel Safe, [place] Patient Advocate Specialist [place] Patient Advocate Team Direct Number: [phone]  Fax: [phone]

## 2021-04-24 ENCOUNTER — Inpatient Hospital Stay (HOSPITAL_COMMUNITY): Payer: Medicare HMO

## 2021-04-24 DIAGNOSIS — A0472 Enterocolitis due to Clostridium difficile, not specified as recurrent: Secondary | ICD-10-CM

## 2021-04-24 DIAGNOSIS — K50819 Crohn's disease of both small and large intestine with unspecified complications: Secondary | ICD-10-CM | POA: Diagnosis not present

## 2021-04-24 DIAGNOSIS — R197 Diarrhea, unspecified: Secondary | ICD-10-CM | POA: Diagnosis not present

## 2021-04-24 DIAGNOSIS — R112 Nausea with vomiting, unspecified: Secondary | ICD-10-CM | POA: Diagnosis not present

## 2021-04-24 LAB — COMPREHENSIVE METABOLIC PANEL
ALT: 19 U/L (ref 0–44)
AST: 22 U/L (ref 15–41)
Albumin: 3.7 g/dL (ref 3.5–5.0)
Alkaline Phosphatase: 80 U/L (ref 38–126)
Anion gap: 10 (ref 5–15)
BUN: 11 mg/dL (ref 8–23)
CO2: 24 mmol/L (ref 22–32)
Calcium: 9.1 mg/dL (ref 8.9–10.3)
Chloride: 98 mmol/L (ref 98–111)
Creatinine, Ser: 0.97 mg/dL (ref 0.44–1.00)
GFR, Estimated: 59 mL/min — ABNORMAL LOW (ref >60)
Glucose, Bld: 102 mg/dL — ABNORMAL HIGH (ref 70–99)
Potassium: 3.5 mmol/L (ref 3.5–5.1)
Sodium: 132 mmol/L — ABNORMAL LOW (ref 135–145)
Total Bilirubin: 0.6 mg/dL (ref 0.3–1.2)
Total Protein: 7.2 g/dL (ref 6.5–8.1)

## 2021-04-24 LAB — CBC WITH DIFFERENTIAL/PLATELET
Abs Immature Granulocytes: 0.02 10*3/uL (ref 0.00–0.07)
Basophils Absolute: 0.1 10*3/uL (ref 0.0–0.1)
Basophils Relative: 1 %
Eosinophils Absolute: 0.1 10*3/uL (ref 0.0–0.5)
Eosinophils Relative: 1 %
HCT: 43.7 % (ref 36.0–46.0)
Hemoglobin: 14.9 g/dL (ref 12.0–15.0)
Immature Granulocytes: 0 %
Lymphocytes Relative: 23 %
Lymphs Abs: 2.1 10*3/uL (ref 0.7–4.0)
MCH: 31.5 pg (ref 26.0–34.0)
MCHC: 34.1 g/dL (ref 30.0–36.0)
MCV: 92.4 fL (ref 80.0–100.0)
Monocytes Absolute: 0.9 10*3/uL (ref 0.1–1.0)
Monocytes Relative: 10 %
Neutro Abs: 5.9 10*3/uL (ref 1.7–7.7)
Neutrophils Relative %: 65 %
Platelets: 288 10*3/uL (ref 150–400)
RBC: 4.73 MIL/uL (ref 3.87–5.11)
RDW: 12 % (ref 11.5–15.5)
WBC: 9 10*3/uL (ref 4.0–10.5)
nRBC: 0 % (ref 0.0–0.2)

## 2021-04-24 LAB — MAGNESIUM: Magnesium: 2 mg/dL (ref 1.7–2.4)

## 2021-04-24 LAB — PHOSPHORUS: Phosphorus: 4.5 mg/dL (ref 2.5–4.6)

## 2021-04-24 MED ORDER — GELATIN ABSORBABLE 12-7 MM EX MISC
CUTANEOUS | Status: AC
  Administered 2021-04-24: 1
  Filled 2021-04-24: qty 1

## 2021-04-24 MED ORDER — LIDOCAINE HCL (PF) 1 % IJ SOLN
INTRAMUSCULAR | Status: AC | PRN
  Administered 2021-04-24: 10 mL

## 2021-04-24 MED ORDER — FENTANYL CITRATE (PF) 100 MCG/2ML IJ SOLN
INTRAMUSCULAR | Status: AC
  Filled 2021-04-24: qty 2

## 2021-04-24 MED ORDER — MIDAZOLAM HCL 2 MG/2ML IJ SOLN
INTRAMUSCULAR | Status: AC
  Filled 2021-04-24: qty 2

## 2021-04-24 MED ORDER — MIDAZOLAM HCL 2 MG/2ML IJ SOLN
INTRAMUSCULAR | Status: AC | PRN
  Administered 2021-04-24: 1 mg via INTRAVENOUS
  Administered 2021-04-24: .5 mg via INTRAVENOUS

## 2021-04-24 MED ORDER — FENTANYL CITRATE (PF) 100 MCG/2ML IJ SOLN
INTRAMUSCULAR | Status: AC | PRN
  Administered 2021-04-24 (×2): 50 ug via INTRAVENOUS

## 2021-04-24 MED ORDER — HYDROMORPHONE HCL 1 MG/ML IJ SOLN
0.5000 mg | INTRAMUSCULAR | Status: DC | PRN
Start: 2021-04-24 — End: 2021-04-27
  Administered 2021-04-24 – 2021-04-27 (×16): 1 mg via INTRAVENOUS
  Filled 2021-04-24 (×17): qty 1

## 2021-04-24 MED ORDER — LIDOCAINE HCL 1 % IJ SOLN
INTRAMUSCULAR | Status: AC
  Administered 2021-04-24: 14:00:00 10 mL
  Filled 2021-04-24: qty 20

## 2021-04-24 NOTE — Progress Notes (Signed)
PROGRESS NOTE    Sara Barnes  AJG:[id number] DOB: October 25, 1940 DOA: 04/19/2021 PCP: Ann Held, DO    Chief Complaint  Patient presents with   Emesis   Diarrhea   Dysuria   Back Pain    Brief Narrative: The patient is a 80 year old Caucasian female with a past medical history significant for but not limited to insomnia, Crohn's disease, depression, dyspepsia as well as other comorbidities who presented to the ED on 04/19/2021 with intractable nausea, vomiting and diarrhea.  Patient states for last 2 weeks she has been having multiple bowel movements daily with severe nausea and poor p.o. appetite and has been vomiting.  She is unable to tolerate much p.o. intake without vomiting.  She states that she feels like she is getting weaker and she does have a history of Crohn's disease and does have some chronic diarrhea but feels this is worse than her baseline.  At home she takes Imodium and Lomotil and is also on 5 mg of prednisone for Crohn's.  She follows up regularly with GI Dr. Hilarie Fredrickson and because her symptoms progressively got worse she presented to the ED for further evaluation.  On admission her vital signs were relatively stable and labs showed a sodium of 130.  Imaging obtained in the ED showed showed multiple hypodense hepatic masses however her bowels appeared fine.  In the ED she is given Dilaudid, Ativan, IV fluids, Zofran and the hospitalist service was consulted for further evaluation and management.  Further work-up reveals that she is positive for C. difficile toxigenic PCR and antigen so she is initially on C. difficile treatment and will be placed on probiotics as well.  Diarrhea is slowing down but she is still getting IV fluid hydration at 1.5 mils per hour.  She then developed shortness of breath with exertion and wheezing as well as left-sided rib cage pain.  Chest x-ray and EKG were ordered and that showed bilateral pleural effusions.  IV fluid hydration has been stopped  and troponins were checked.  Plan is for a ultrasound biopsy of the liver that is tentatively scheduled for 04/23/2021 pending any emergent IR cases however IR had to push it back to 04/24/2021.   Patient's diarrhea is improving and bowel movements are little bit more formed and she is feeling better.  Her shortness of breath is also resolved.  Now complaining of a headache.  GI is recommending completing 14-day course of oral vancomycin and following on her liver biopsy results.  They are recommending continue maintenance dose prednisone for Crohn's disease  Assessment & Plan:   Principal Problem:   Intractable nausea and vomiting Active Problems:   Depression   Diarrhea   Crohn disease (HCC)   Hyponatremia   Clostridium difficile colitis   Intractable nausea vomiting with diarrhea due to C. difficile, in the setting of Crohn's disease  patient is currently on oral vancomycin with improvement in diarrhea. Symptomatic management with IV fluids, antiemetics and pain control. Patient continues with low-dose prednisone for her Crohn's disease GI on board and appreciate recommendations    Chest discomfort and shortness of breath Resolved. EKG is unremarkable Probably secondary to volume overload.   Hepatic lesions, suspicious for metastatic cancer Patient has history of metastatic breast cancer with mets to the chest wall that have been excised. Underwent liver biopsy today. Further recommendations as per oncology.  Dr. Burr Medico   Hypokalemia Replaced   Anxiety and depression Continue with home medications.   DVT prophylaxis: Lovenox  Code Status: full code.  Family Communication: discussed theplan with sister.  Disposition:   Status is: Inpatient  Remains inpatient appropriate because: liver biopsy.        Consultants:  IR  Gastroenterology.   Procedures: US GUIDED biopsy of liver mass.  Antimicrobials: none.    Subjective: No new complaints.    Objective: Vitals:   04/24/21 1320 04/24/21 1325 04/24/21 1330 04/24/21 1426  BP: (!) 145/65 (!) 144/78 (!) 150/72 103/62  Pulse: 76 78 76 73  Resp: 14 16 18 18   Temp:    98.4 F (36.9 C)  TempSrc:    Oral  SpO2: 100% 100% 100% 97%  Weight:      Height:        Intake/Output Summary (Last 24 hours) at 04/24/2021 1840 Last data filed at 04/24/2021 1759 Gross per 24 hour  Intake 480 ml  Output --  Net 480 ml   Filed Weights   04/19/21 2130  Weight: 72.1 kg    Examination:  General exam: Appears calm and comfortable  Respiratory system: Clear to auscultation. Respiratory effort normal. Cardiovascular system: S1 & S2 heard, RRR. No JVD, No pedal edema. Gastrointestinal system: Abdomen is nondistended, soft and nontender. Normal bowel sounds heard. Central nervous system: Alert and oriented. No focal neurological deficits. Extremities: Symmetric 5 x 5 power. Skin: No rashes, lesions or ulcers Psychiatry: Mood & affect appropriate.     Data Reviewed: I have personally reviewed following labs and imaging studies  CBC: Recent Labs  Lab 04/20/21 0439 04/21/21 0458 04/22/21 0407 04/23/21 0415 04/24/21 0432  WBC 6.2 6.4 8.5 7.6 9.0  NEUTROABS  --  4.2 5.9 5.4 5.9  HGB 12.8 13.0 12.8 13.4 14.9  HCT 38.2 38.0 38.4 40.2 43.7  MCV 93.6 93.6 94.8 93.1 92.4  PLT 213 213 200 229 017    Basic Metabolic Panel: Recent Labs  Lab 04/20/21 0439 04/21/21 0458 04/22/21 0407 04/23/21 0415 04/24/21 0432  NA 134* 134* 133* 133* 132*  K 3.8 3.4* 4.4 3.6 3.5  CL 104 103 102 99 98  CO2 23 25 26 28 24   GLUCOSE 85 92 90 96 102*  BUN 7* 8 9 9 11   CREATININE 0.76 0.73 0.85 0.93 0.97  CALCIUM 8.4* 8.5* 8.6* 8.6* 9.1  MG 1.8 1.9 2.2 2.0 2.0  PHOS 3.4 3.3 3.4 4.8* 4.5    GFR: Estimated Creatinine Clearance: 47.8 mL/min (by C-G formula based on SCr of 0.97 mg/dL).  Liver Function Tests: Recent Labs  Lab 04/20/21 0439 04/21/21 0458 04/22/21 0407 04/23/21 0415  04/24/21 0432  AST 18 16 16 18 22   ALT 14 13 11 14 19   ALKPHOS 66 70 65 67 80  BILITOT 0.7 0.5 0.6 0.4 0.6  PROT 5.3* 5.4* 5.5* 6.2* 7.2  ALBUMIN 2.8* 2.9* 2.9* 3.4* 3.7    CBG: No results for input(s): GLUCAP in the last 168 hours.   Recent Results (from the past 240 hour(s))  Resp Panel by RT-PCR (Flu A&B, Covid) Nasopharyngeal Swab     Status: None   Collection Time: 04/19/21 12:48 PM   Specimen: Nasopharyngeal Swab; Nasopharyngeal(NP) swabs in vial transport medium  Result Value Ref Range Status   SARS Coronavirus 2 by RT PCR NEGATIVE NEGATIVE Final    Comment: (NOTE) SARS-CoV-2 target nucleic acids are NOT DETECTED.  The SARS-CoV-2 RNA is generally detectable in upper respiratory specimens during the acute phase of infection. The lowest concentration of SARS-CoV-2 viral copies this assay can detect is 138  copies/mL. A negative result does not preclude SARS-Cov-2 infection and should not be used as the sole basis for treatment or other patient management decisions. A negative result may occur with  improper specimen collection/handling, submission of specimen other than nasopharyngeal swab, presence of viral mutation(s) within the areas targeted by this assay, and inadequate number of viral copies(<138 copies/mL). A negative result must be combined with clinical observations, patient history, and epidemiological information. The expected result is Negative.  Fact Sheet for Patients:  EntrepreneurPulse.com.au  Fact Sheet for Healthcare Providers:  IncredibleEmployment.be  This test is no t yet approved or cleared by the [place] FDA and  has been authorized for detection and/or diagnosis of SARS-CoV-2 by FDA under an Emergency Use Authorization (EUA). This EUA will remain  in effect (meaning this test can be used) for the duration of the COVID-19 declaration under Section 564(b)(1) of the Act, 21 U.S.C.section 360bbb-3(b)(1),  unless the authorization is terminated  or revoked sooner.       Influenza A by PCR NEGATIVE NEGATIVE Final   Influenza B by PCR NEGATIVE NEGATIVE Final    Comment: (NOTE) The Xpert Xpress SARS-CoV-2/FLU/RSV plus assay is intended as an aid in the diagnosis of influenza from Nasopharyngeal swab specimens and should not be used as a sole basis for treatment. Nasal washings and aspirates are unacceptable for Xpert Xpress SARS-CoV-2/FLU/RSV testing.  Fact Sheet for Patients: EntrepreneurPulse.com.au  Fact Sheet for Healthcare Providers: IncredibleEmployment.be  This test is not yet approved or cleared by the [place] FDA and has been authorized for detection and/or diagnosis of SARS-CoV-2 by FDA under an Emergency Use Authorization (EUA). This EUA will remain in effect (meaning this test can be used) for the duration of the COVID-19 declaration under Section 564(b)(1) of the Act, 21 U.S.C. section 360bbb-3(b)(1), unless the authorization is terminated or revoked.  Performed at [facility], [place] [address] [place], [place] [address]   Urine Culture     Status: None   Collection Time: 04/19/21 12:56 PM   Specimen: Urine, Clean Catch  Result Value Ref Range Status   Specimen Description   Final    URINE, CLEAN CATCH Performed at [facility], [place] [address], [place], [place] [address]    Special Requests   Final    NONE Performed at [facility], [place] [address] [place], [place] [address]    Culture   Final    NO GROWTH Performed at [facility], [place] [address] [place], [place] [address]    Report Status 04/20/2021 FINAL  Final  Gastrointestinal Panel by PCR , Stool     Status: None   Collection Time: 04/20/21 10:01 AM   Specimen: STOOL  Result Value Ref Range Status   Campylobacter species NOT DETECTED NOT DETECTED Final   Plesimonas shigelloides NOT DETECTED NOT  DETECTED Final   Salmonella species NOT DETECTED NOT DETECTED Final   Yersinia enterocolitica NOT DETECTED NOT DETECTED Final   Vibrio species NOT DETECTED NOT DETECTED Final   Vibrio cholerae NOT DETECTED NOT DETECTED Final   Enteroaggregative E coli (EAEC) NOT DETECTED NOT DETECTED Final   Enteropathogenic E coli (EPEC) NOT DETECTED NOT DETECTED Final   Enterotoxigenic E coli (ETEC) NOT DETECTED NOT DETECTED Final   Shiga like toxin producing E coli (STEC) NOT DETECTED NOT DETECTED Final   Shigella/Enteroinvasive E coli (EIEC) NOT DETECTED NOT DETECTED Final   Cryptosporidium NOT DETECTED NOT DETECTED Final   Cyclospora cayetanensis  NOT DETECTED NOT DETECTED Final   Entamoeba histolytica NOT DETECTED NOT DETECTED Final   Giardia lamblia NOT DETECTED NOT DETECTED Final   Adenovirus F40/41 NOT DETECTED NOT DETECTED Final   Astrovirus NOT DETECTED NOT DETECTED Final   Norovirus GI/GII NOT DETECTED NOT DETECTED Final   Rotavirus A NOT DETECTED NOT DETECTED Final   Sapovirus (I, II, IV, and V) NOT DETECTED NOT DETECTED Final    Comment: Performed at [facility], [address] [place], [place] [address]  C Difficile Quick Screen w PCR reflex     Status: Abnormal   Collection Time: 04/20/21 10:01 AM   Specimen: STOOL  Result Value Ref Range Status   C Diff antigen POSITIVE (A) NEGATIVE Final   C Diff toxin NEGATIVE NEGATIVE Final   C Diff interpretation Results are indeterminate. See PCR results.  Final    Comment: Performed at [facility], [place] [address], [place], [place] [address]  C. Diff by PCR, Reflexed     Status: Abnormal   Collection Time: 04/20/21 10:01 AM  Result Value Ref Range Status   Toxigenic C. Difficile by PCR POSITIVE (A) NEGATIVE Final    Comment: Positive for toxigenic C. difficile with little to no toxin production. Only treat if clinical presentation suggests symptomatic illness. Performed at [facility], [place] [address],  [place], [place] [address]          Radiology Studies: US BIOPSY (LIVER)  Result Date: 04/24/2021 INDICATION: 80 year old female with a history multiple liver lesions referred for biopsy EXAM: ULTRASOUND-GUIDED BIOPSY LIVER MASS MEDICATIONS: None. ANESTHESIA/SEDATION: Moderate (conscious) sedation was employed during this procedure. A total of Versed 1.5 mg and Fentanyl 100 mcg was administered intravenously by the radiology nurse. Total intra-service moderate Sedation Time: 10 minutes. The patient's level of consciousness and vital signs were monitored continuously by radiology nursing throughout the procedure under my direct supervision. FLUOROSCOPY TIME:  Ultrasound none COMPLICATIONS: None PROCEDURE: Informed written consent was obtained from the patient after a thorough discussion of the procedural risks, benefits and alternatives. All questions were addressed. Maximal Sterile Barrier Technique was utilized including caps, mask, sterile gowns, sterile gloves, sterile drape, hand hygiene and skin antiseptic. A timeout was performed prior to the initiation of the procedure. Ultrasound survey of the right liver lobe performed with images stored and sent to PACs. The subxiphoid region was prepped with chlorhexidine in a sterile fashion, and a sterile drape was applied covering the operative field. A sterile gown and sterile gloves were used for the procedure. Local anesthesia was provided with 1% Lidocaine. The patient was prepped and draped sterilely and the skin and subcutaneous tissues were generously infiltrated with 1% lidocaine. A 17 gauge introducer needle was then advanced under ultrasound guidance in an intercostal location into the left liver lobe, targeting left liver mass. The stylet was removed, and multiple separate 18 gauge core biopsy were retrieved. Samples were placed into formalin for transportation to the lab. Gel-Foam pledgets were then infused with a small amount of saline for assistance  with hemostasis. The needle was removed, and a final ultrasound image was performed. The patient tolerated the procedure well and remained hemodynamically stable throughout. No complications were encountered and no significant blood loss was encounter. IMPRESSION: Status post ultrasound-guided biopsy of left liver mass Signed, Dulcy Fanny. Dellia Nims, RPVI [facility] Electronically Signed   By: Corrie Mckusick D.O.   On: 04/24/2021 14:43   DG CHEST  PORT 1 VIEW  Result Date: 04/23/2021 CLINICAL DATA:  Shortness of breath EXAM: PORTABLE CHEST 1 VIEW COMPARISON:  04/22/2021 FINDINGS: Transverse diameter of heart is increased. There is diffuse haziness in the left mid and left lower lung fields. This may be due to moderate left pleural effusion. Evaluation of left mid and left lower lung fields for infiltrates is limited by the effusion. Right lung is clear. Right lateral CP angle is clear. There is no pneumothorax. IMPRESSION: No significant interval changes are noted in the homogeneous opacity in the left mid and left lower lung fields suggesting moderate left pleural effusion. Possibility of underlying atelectasis/pneumonitis in the left mid and left lower lung fields is not excluded. Electronically Signed   By: Elmer Picker M.D.   On: 04/23/2021 10:03        Scheduled Meds:  [START ON 04/25/2021] enoxaparin (LOVENOX) injection  40 mg Subcutaneous Daily   famotidine  20 mg Oral QHS   feeding supplement  1 Container Oral TID BM   fentaNYL       FLUoxetine  20 mg Oral Daily   midazolam       multivitamin with minerals  1 tablet Oral Daily   predniSONE  5 mg Oral Q breakfast   vancomycin  125 mg Oral QID   Continuous Infusions:   LOS: 5 days        Hosie Poisson, MD [facility]   To contact the attending provider between 7A-7P or the covering provider during after hours 7P-7A, please log into the web site [email or web address]  and access using universal [place] password for that web site. If you do not have the password, please call the hospital operator.  04/24/2021, 6:40 PM

## 2021-04-24 NOTE — Progress Notes (Addendum)
Progress Note   Subjective  Chief Complaint: Crohn's disease, liver lesions  Patient denies any bowel movements last night, had 2 bowel movements this morning.  No melena or hematochezia. Was supposed to have liver biopsy yesterday, this was postponed till today. Patient continues to have nausea, states this is improved some.   Denies fever, chills, worsening abdominal pain. Sister is at her bedside.   Objective   Vital signs in last 24 hours: Temp:  [97.6 F (36.4 C)-98.1 F (36.7 C)] 97.6 F (36.4 C) (12/28 0438) Pulse Rate:  [68-73] 73 (12/28 0438) Resp:  [16-18] 18 (12/28 0438) BP: (128-137)/(72-75) 128/75 (12/28 0438) SpO2:  [93 %-96 %] 93 % (12/28 0438) Last BM Date: 04/24/21  General:   female in no acute distress, Heart:  regular rate and rhythm, no murmurs or gallops Pulm: Clear anteriorly; no wheezing Abdomen:  Soft, Flat AB, Normal bowel sounds. mild tenderness in the entire abdomen. Without guarding and Without rebound Extremities:  Without edema. Neurologic:  Alert and  oriented x4;  grossly normal neurologically.  Skin:   Rash left forearm.  Psychiatric: Cooperative. Normal mood and affect.  Intake/Output from previous day: 12/27 0701 - 12/28 0700 In: 1954 [P.O.:1954] Out: -  Intake/Output this shift: No intake/output data recorded.  Lab Results: Recent Labs    04/22/21 0407 04/23/21 0415 04/24/21 0432  WBC 8.5 7.6 9.0  HGB 12.8 13.4 14.9  HCT 38.4 40.2 43.7  PLT 200 229 288   BMET Recent Labs    04/22/21 0407 04/23/21 0415 04/24/21 0432  NA 133* 133* 132*  K 4.4 3.6 3.5  CL 102 99 98  CO2 26 28 24   GLUCOSE 90 96 102*  BUN 9 9 11   CREATININE 0.85 0.93 0.97  CALCIUM 8.6* 8.6* 9.1   LFT Recent Labs    04/24/21 0432  PROT 7.2  ALBUMIN 3.7  AST 22  ALT 19  ALKPHOS 80  BILITOT 0.6   PT/INR Recent Labs    04/23/21 0415  LABPROT 13.0  INR 1.0    Studies/Results: DG CHEST PORT 1 VIEW  Result Date: 04/23/2021 CLINICAL  DATA:  Shortness of breath EXAM: PORTABLE CHEST 1 VIEW COMPARISON:  04/22/2021 FINDINGS: Transverse diameter of heart is increased. There is diffuse haziness in the left mid and left lower lung fields. This may be due to moderate left pleural effusion. Evaluation of left mid and left lower lung fields for infiltrates is limited by the effusion. Right lung is clear. Right lateral CP angle is clear. There is no pneumothorax. IMPRESSION: No significant interval changes are noted in the homogeneous opacity in the left mid and left lower lung fields suggesting moderate left pleural effusion. Possibility of underlying atelectasis/pneumonitis in the left mid and left lower lung fields is not excluded. Electronically Signed   By: Elmer Picker M.D.   On: 04/23/2021 10:03       Impression/plan   80 y.o. 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   On chronic Prednisone, Lomotil and Imodium for IBD therapy outpatient.   She was admitted to the hospital 04/19/2021 worsening diarrhea due to C. Difficile. Started on [place] on low dose Prednisone. Diarrhea has decreased.  WBC 7.6 HGB 13.4 MCV 93.1 Platelets 229 -Greatly decreased number of bowel movements daily -Continue Vancomycin 125 mg p.o. 4 times daily for at least 14 day  course- DAY 5 -Continue low-dose Prednisone for now (maintenance dose for Crohn's disease) -Florastor probiotic one po bid -Diet as tolerated  Nausea Pepcid restarted once at night Continue zofran as needed, has improved.    History of breast cancer 2016 s/p laminectomy. 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 She followed with her oncology team 07/2020 who recommended a PET scan to check for other sites of  metastasis however she declined and has not been back to see them since then.  CTAP 04/19/2021 showed new masses in her liver and also bilateral pleural effusions concerning for mets.   -Liver biopsy today with IR concerning metastatic disease - oncology following.    LOS: 5 days   Sara Barnes  04/24/2021, 9:20 AM   Attending physician's note   I have taken an interval history, reviewed the chart and examined the patient. I agree with the Advanced Practitioner's note, impression and recommendations.   Diarrhea is improving.  Await liver biopsy results, oncology consult based on pathology  Follow up in GI office after discharge as needed  GI will sign off, available if have any questions   The patient was provided an opportunity to ask questions and all were answered. The patient agreed with the plan and demonstrated an understanding of the instructions.  Damaris Hippo , MD [phone]

## 2021-04-24 NOTE — Progress Notes (Signed)
PT Cancellation Note  Patient Details Name: Sara Barnes MRN: [id number] DOB: 1941-02-22   Cancelled Treatment:    Reason Eval/Treat Not Completed: Patient planned for biopsy today,will check back another time.  [place] Pager [phone] Office [phone]    Claretha Cooper 04/24/2021, 7:06 AM

## 2021-04-24 NOTE — Procedures (Signed)
Interventional Radiology Procedure Note  Procedure: US guided biopsy of liver mass, left liver Complications: None EBL: None Recommendations: - Bedrest 2 hours. Ambulate per primary order  - Routine wound care - Follow up pathology - Advance diet per primary order  Signed,  Corrie Mckusick, DO

## 2021-04-24 NOTE — Evaluation (Signed)
Occupational Therapy Evaluation Patient Details Name: Sara Barnes MRN: [id number] DOB: Jan 16, 1941 Today's Date: 04/24/2021   History of Present Illness Patient is a 80 year old female who presented with nausea, vomitting and diarhea. patient was admitted with intractable nausea and vomitting, C diff and  dyspnea. patient is pending biopsy on 12/28 of liver   PMH: crohn's disease, R hemicolectomy, SBO, ileostomy revision.   Clinical Impression   Patient evaluated by Occupational Therapy with no further acute OT needs identified. All education has been completed and the patient has no further questions. Patient is MI for ADL on this date. Patient to have family support as needed at time of d/c.  See below for any follow-up Occupational Therapy or equipment needs. OT is signing off. Thank you for this referral.       Recommendations for follow up therapy are one component of a multi-disciplinary discharge planning process, led by the attending physician.  Recommendations may be updated based on patient status, additional functional criteria and insurance authorization.   Follow Up Recommendations  No OT follow up    Assistance Recommended at Discharge PRN  Functional Status Assessment  Patient has not had a recent decline in their functional status  Equipment Recommendations  None recommended by OT    Recommendations for Other Services       Precautions / Restrictions Restrictions Weight Bearing Restrictions: No      Mobility Bed Mobility Overal bed mobility: Modified Independent                  Transfers Overall transfer level: Modified independent Equipment used: None;Rolling walker (2 wheels)                      Balance Overall balance assessment: No apparent balance deficits (not formally assessed)                                         ADL either performed or assessed with clinical judgement   ADL Overall ADL's : Modified  independent                                       General ADL Comments: patient is completing toileting, functional mobility, LB dressing tasks and bathing MI in room with patient noted to be able to complete tasks with or without RW. patient completed all noted tasks in room with MI on this date.  patient reported she likes to have it available since she is feeling weaker while being NPO for biopsy today. patient was educated on ECT, environmental modifications to avoid using step stool at home in kitchen and reacher to reduce falls risk at home. patient verbalized understanding.     Vision Patient Visual Report: No change from baseline       Perception     Praxis      Pertinent Vitals/Pain Pain Assessment: No/denies pain     Hand Dominance Right   Extremity/Trunk Assessment Upper Extremity Assessment Upper Extremity Assessment: Overall WFL for tasks assessed   Lower Extremity Assessment Lower Extremity Assessment: Defer to PT evaluation   Cervical / Trunk Assessment Cervical / Trunk Assessment: Normal   Communication     Cognition Arousal/Alertness: Awake/alert Behavior During Therapy: WFL for tasks assessed/performed Overall Cognitive Status: Within Functional Limits  for tasks assessed                                       General Comments       Exercises     Shoulder Instructions      Home Living Family/patient expects to be discharged to:: Private residence Living Arrangements: Alone Available Help at Discharge: Family;Available PRN/intermittently Type of Home: House Home Access: Level entry     Home Layout: Two level;Able to live on main level with bedroom/bathroom Alternate Level Stairs-Number of Steps: flight   Bathroom Shower/Tub: Occupational psychologist: Standard     Home Equipment: Conservation officer, nature (2 wheels);Cane - single point;BSC/3in1;Shower seat;Hand held shower head   Additional Comments: sister can  stay as needed      Prior Functioning/Environment Prior Level of Function : Independent/Modified Independent                        OT Problem List:        OT Treatment/Interventions:      OT Goals(Current goals can be found in the care plan section) Acute Rehab OT Goals OT Goal Formulation: All assessment and education complete, DC therapy  OT Frequency:     Barriers to D/C:            Co-evaluation              AM-PAC OT "6 Clicks" Daily Activity     Outcome Measure Help from another person eating meals?: None Help from another person taking care of personal grooming?: None Help from another person toileting, which includes using toliet, bedpan, or urinal?: None Help from another person bathing (including washing, rinsing, drying)?: None Help from another person to put on and taking off regular upper body clothing?: None Help from another person to put on and taking off regular lower body clothing?: None 6 Click Score: 24   End of Session Nurse Communication: Mobility status  Activity Tolerance: Patient tolerated treatment well Patient left: in bed;with call bell/phone within reach  OT Visit Diagnosis: Unsteadiness on feet (R26.81)                Time: 0488-8916 OT Time Calculation (min): 26 min Charges:  OT Evaluation $OT Eval Low Complexity: 1 Low OT Treatments $Self Care/Home Management : 8-22 mins  Jackelyn Poling OTR/L, MS Acute Rehabilitation Department Office# [phone] Pager# [phone]   Marcellina Millin 04/24/2021, 9:31 AM

## 2021-04-25 DIAGNOSIS — A0472 Enterocolitis due to Clostridium difficile, not specified as recurrent: Secondary | ICD-10-CM | POA: Diagnosis not present

## 2021-04-25 DIAGNOSIS — R197 Diarrhea, unspecified: Secondary | ICD-10-CM | POA: Diagnosis not present

## 2021-04-25 DIAGNOSIS — K50819 Crohn's disease of both small and large intestine with unspecified complications: Secondary | ICD-10-CM | POA: Diagnosis not present

## 2021-04-25 MED ORDER — FAMOTIDINE IN NACL 20-0.9 MG/50ML-% IV SOLN
20.0000 mg | INTRAVENOUS | Status: DC
  Administered 2021-04-25 – 2021-05-03 (×9): 20 mg via INTRAVENOUS
  Filled 2021-04-25 (×9): qty 50

## 2021-04-25 MED ORDER — METOCLOPRAMIDE HCL 5 MG/ML IJ SOLN
5.0000 mg | Freq: Three times a day (TID) | INTRAMUSCULAR | Status: DC
Start: 2021-04-25 — End: 2021-04-28
  Administered 2021-04-25 – 2021-04-28 (×9): 5 mg via INTRAVENOUS
  Filled 2021-04-25 (×9): qty 2

## 2021-04-25 MED ORDER — SODIUM CHLORIDE 0.9 % IV SOLN
12.5000 mg | Freq: Four times a day (QID) | INTRAVENOUS | Status: DC | PRN
  Administered 2021-04-29 – 2021-05-02 (×3): 12.5 mg via INTRAVENOUS
  Filled 2021-04-25: qty 12.5
  Filled 2021-04-25: qty 0.5
  Filled 2021-04-25: qty 12.5
  Filled 2021-04-25: qty 0.5
  Filled 2021-04-25: qty 12.5
  Filled 2021-04-25: qty 0.5

## 2021-04-25 MED ORDER — SODIUM CHLORIDE 0.9 % IV SOLN: INTRAVENOUS | Status: DC

## 2021-04-25 MED ORDER — VANCOMYCIN 50 MG/ML ORAL SOLUTION
125.0000 mg | Freq: Four times a day (QID) | ORAL | Status: DC
  Administered 2021-04-25 – 2021-04-27 (×7): 125 mg via ORAL
  Filled 2021-04-25 (×17): qty 2.5

## 2021-04-25 NOTE — Progress Notes (Signed)
PROGRESS NOTE    Sara Barnes  PRX:[id number] DOB: 07-Jun-1940 DOA: 04/19/2021 PCP: Ann Held, DO    Chief Complaint  Patient presents with   Emesis   Diarrhea   Dysuria   Back Pain    Brief Narrative: The patient is a 80 year old Caucasian female with a past medical history significant for but not limited to insomnia, Crohn's disease, depression, dyspepsia as well as other comorbidities who presented to the ED on 04/19/2021 with intractable nausea, vomiting and diarrhea.  Patient states for last 2 weeks she has been having multiple bowel movements daily with severe nausea and poor p.o. appetite and has been vomiting.  She is unable to tolerate much p.o. intake without vomiting.  She states that she feels like she is getting weaker and she does have a history of Crohn's disease and does have some chronic diarrhea but feels this is worse than her baseline.  At home she takes Imodium and Lomotil and is also on 5 mg of prednisone for Crohn's.  She follows up regularly with GI Dr. Hilarie Fredrickson and because her symptoms progressively got worse she presented to the ED for further evaluation.  On admission her vital signs were relatively stable and labs showed a sodium of 130.  Imaging obtained in the ED showed showed multiple hypodense hepatic masses however her bowels appeared fine.  In the ED she is given Dilaudid, Ativan, IV fluids, Zofran and the hospitalist service was consulted for further evaluation and management.  Further work-up reveals that she is positive for C. difficile toxigenic PCR and antigen so she is initially on C. difficile treatment and will be placed on probiotics as well.  Diarrhea is slowing down but she is still getting IV fluid hydration at 1.5 mils per hour.  She then developed shortness of breath with exertion and wheezing as well as left-sided rib cage pain.  Chest x-ray and EKG were ordered and that showed bilateral pleural effusions.  IV fluid hydration has been stopped  and troponins were checked.  Plan is for a ultrasound biopsy of the liver that is tentatively scheduled for 04/23/2021 pending any emergent IR cases however IR had to push it back to 04/24/2021.   Patient's diarrhea is improving and bowel movements are little bit more formed and she is feeling better.  Her shortness of breath is also resolved.  Now complaining of a headache.  GI is recommending completing 14-day course of oral vancomycin and following on her liver biopsy results.  They are recommending continue maintenance dose prednisone for Crohn's disease  Assessment & Plan:   Principal Problem:   Intractable nausea and vomiting Active Problems:   Depression   Diarrhea   Crohn disease (HCC)   Hyponatremia   Clostridium difficile colitis   Intractable nausea vomiting with diarrhea due to C. difficile, in the setting of Crohn's disease  patient is currently on oral vancomycin with improvement in diarrhea. She reports the nausea is better, no vomiting.  Symptomatic management with IV fluids, antiemetics and pain control. Patient continues with low-dose prednisone for her Crohn's disease GI on board and appreciate recommendations.     Chest discomfort and shortness of breath Resolved. EKG is unremarkable Probably secondary to volume overload. She is on RA.    Hepatic lesions, suspicious for metastatic cancer Patient has history of metastatic breast cancer with mets to the chest wall that have been excised. Underwent liver biopsy  on 04/24/21.  Pt reports pain at the site of the  biopsy.  Oncology to follow up the results.  Further recommendations as per oncology.  Dr. Burr Medico   Hypokalemia Replaced   Anxiety and depression Continue with home medications.  Mild hyponatremia with some nausea.  Check for SIADH. Check am cortisol level.  Check urine sodium and serum osmo    DVT prophylaxis: Lovenox Code Status: full code.  Family Communication: family at bedside.   Disposition:   Status is: Inpatient  Remains inpatient appropriate because: plan for discharge once her diarrhea improves and abd pain improves.        Consultants:  IR  Gastroenterology.   Procedures: US GUIDED biopsy of liver mass.  Antimicrobials: none.    Subjective: Pain at be biopsy site.   Objective: Vitals:   04/24/21 1426 04/24/21 2147 04/25/21 0553 04/25/21 1339  BP: 103/62 90/71 108/69 96/69  Pulse: 73 91 95 95  Resp: 18 16 17 16   Temp: 98.4 F (36.9 C) (!) 97.5 F (36.4 C) 97.6 F (36.4 C) 97.6 F (36.4 C)  TempSrc: Oral Oral    SpO2: 97% 94% 94% 94%  Weight:      Height:        Intake/Output Summary (Last 24 hours) at 04/25/2021 1527 Last data filed at 04/25/2021 8242 Gross per 24 hour  Intake 600 ml  Output 0 ml  Net 600 ml    Filed Weights   04/19/21 2130  Weight: 72.1 kg    Examination:  General exam: Appears calm and comfortable  Respiratory system: Clear to auscultation. Respiratory effort normal. Cardiovascular system: S1 & S2 heard, RRR. No JVD,  No pedal edema. Gastrointestinal system: Abdomen is soft, mildly tender , Normal bowel sounds heard. Central nervous system: Alert and oriented. No focal neurological deficits. Extremities: Symmetric 5 x 5 power. Skin: No rashes, lesions or ulcers Psychiatry: Mood & affect appropriate.      Data Reviewed: I have personally reviewed following labs and imaging studies  CBC: Recent Labs  Lab 04/20/21 0439 04/21/21 0458 04/22/21 0407 04/23/21 0415 04/24/21 0432  WBC 6.2 6.4 8.5 7.6 9.0  NEUTROABS  --  4.2 5.9 5.4 5.9  HGB 12.8 13.0 12.8 13.4 14.9  HCT 38.2 38.0 38.4 40.2 43.7  MCV 93.6 93.6 94.8 93.1 92.4  PLT 213 213 200 229 288     Basic Metabolic Panel: Recent Labs  Lab 04/20/21 0439 04/21/21 0458 04/22/21 0407 04/23/21 0415 04/24/21 0432  NA 134* 134* 133* 133* 132*  K 3.8 3.4* 4.4 3.6 3.5  CL 104 103 102 99 98  CO2 23 25 26 28 24   GLUCOSE 85 92 90 96 102*   BUN 7* 8 9 9 11   CREATININE 0.76 0.73 0.85 0.93 0.97  CALCIUM 8.4* 8.5* 8.6* 8.6* 9.1  MG 1.8 1.9 2.2 2.0 2.0  PHOS 3.4 3.3 3.4 4.8* 4.5     GFR: Estimated Creatinine Clearance: 47.8 mL/min (by C-G formula based on SCr of 0.97 mg/dL).  Liver Function Tests: Recent Labs  Lab 04/20/21 0439 04/21/21 0458 04/22/21 0407 04/23/21 0415 04/24/21 0432  AST 18 16 16 18 22   ALT 14 13 11 14 19   ALKPHOS 66 70 65 67 80  BILITOT 0.7 0.5 0.6 0.4 0.6  PROT 5.3* 5.4* 5.5* 6.2* 7.2  ALBUMIN 2.8* 2.9* 2.9* 3.4* 3.7     CBG: No results for input(s): GLUCAP in the last 168 hours.   Recent Results (from the past 240 hour(s))  Resp Panel by RT-PCR (Flu A&B, Covid) Nasopharyngeal Swab  Status: None   Collection Time: 04/19/21 12:48 PM   Specimen: Nasopharyngeal Swab; Nasopharyngeal(NP) swabs in vial transport medium  Result Value Ref Range Status   SARS Coronavirus 2 by RT PCR NEGATIVE NEGATIVE Final    Comment: (NOTE) SARS-CoV-2 target nucleic acids are NOT DETECTED.  The SARS-CoV-2 RNA is generally detectable in upper respiratory specimens during the acute phase of infection. The lowest concentration of SARS-CoV-2 viral copies this assay can detect is 138 copies/mL. A negative result does not preclude SARS-Cov-2 infection and should not be used as the sole basis for treatment or other patient management decisions. A negative result may occur with  improper specimen collection/handling, submission of specimen other than nasopharyngeal swab, presence of viral mutation(s) within the areas targeted by this assay, and inadequate number of viral copies(<138 copies/mL). A negative result must be combined with clinical observations, patient history, and epidemiological information. The expected result is Negative.  Fact Sheet for Patients:  EntrepreneurPulse.com.au  Fact Sheet for Healthcare Providers:  IncredibleEmployment.be  This test is no t  yet approved or cleared by the [place] FDA and  has been authorized for detection and/or diagnosis of SARS-CoV-2 by FDA under an Emergency Use Authorization (EUA). This EUA will remain  in effect (meaning this test can be used) for the duration of the COVID-19 declaration under Section 564(b)(1) of the Act, 21 U.S.C.section 360bbb-3(b)(1), unless the authorization is terminated  or revoked sooner.       Influenza A by PCR NEGATIVE NEGATIVE Final   Influenza B by PCR NEGATIVE NEGATIVE Final    Comment: (NOTE) The Xpert Xpress SARS-CoV-2/FLU/RSV plus assay is intended as an aid in the diagnosis of influenza from Nasopharyngeal swab specimens and should not be used as a sole basis for treatment. Nasal washings and aspirates are unacceptable for Xpert Xpress SARS-CoV-2/FLU/RSV testing.  Fact Sheet for Patients: EntrepreneurPulse.com.au  Fact Sheet for Healthcare Providers: IncredibleEmployment.be  This test is not yet approved or cleared by the [place] FDA and has been authorized for detection and/or diagnosis of SARS-CoV-2 by FDA under an Emergency Use Authorization (EUA). This EUA will remain in effect (meaning this test can be used) for the duration of the COVID-19 declaration under Section 564(b)(1) of the Act, 21 U.S.C. section 360bbb-3(b)(1), unless the authorization is terminated or revoked.  Performed at [facility], [place] [address] [place], [place] [address]   Urine Culture     Status: None   Collection Time: 04/19/21 12:56 PM   Specimen: Urine, Clean Catch  Result Value Ref Range Status   Specimen Description   Final    URINE, CLEAN CATCH Performed at [facility], [place] [address] [place], [place] [address]    Special Requests   Final    NONE Performed at [facility], [place] [address], [place], [place] [address]    Culture   Final    NO GROWTH Performed at  [facility], [place] [address], [place],  [address]    Report Status 04/20/2021 FINAL  Final  Gastrointestinal Panel by PCR , Stool     Status: None   Collection Time: 04/20/21 10:01 AM   Specimen: STOOL  Result Value Ref Range Status   Campylobacter species NOT DETECTED NOT DETECTED Final   Plesimonas shigelloides NOT DETECTED NOT DETECTED Final   Salmonella species NOT DETECTED NOT DETECTED Final   Yersinia enterocolitica NOT DETECTED NOT DETECTED Final   Vibrio species NOT DETECTED NOT DETECTED Final  Vibrio cholerae NOT DETECTED NOT DETECTED Final   Enteroaggregative E coli (EAEC) NOT DETECTED NOT DETECTED Final   Enteropathogenic E coli (EPEC) NOT DETECTED NOT DETECTED Final   Enterotoxigenic E coli (ETEC) NOT DETECTED NOT DETECTED Final   Shiga like toxin producing E coli (STEC) NOT DETECTED NOT DETECTED Final   Shigella/Enteroinvasive E coli (EIEC) NOT DETECTED NOT DETECTED Final   Cryptosporidium NOT DETECTED NOT DETECTED Final   Cyclospora cayetanensis NOT DETECTED NOT DETECTED Final   Entamoeba histolytica NOT DETECTED NOT DETECTED Final   Giardia lamblia NOT DETECTED NOT DETECTED Final   Adenovirus F40/41 NOT DETECTED NOT DETECTED Final   Astrovirus NOT DETECTED NOT DETECTED Final   Norovirus GI/GII NOT DETECTED NOT DETECTED Final   Rotavirus A NOT DETECTED NOT DETECTED Final   Sapovirus (I, II, IV, and V) NOT DETECTED NOT DETECTED Final    Comment: Performed at [facility], [place] [place], [place] [address]  C Difficile Quick Screen w PCR reflex     Status: Abnormal   Collection Time: 04/20/21 10:01 AM   Specimen: STOOL  Result Value Ref Range Status   C Diff antigen POSITIVE (A) NEGATIVE Final   C Diff toxin NEGATIVE NEGATIVE Final   C Diff interpretation Results are indeterminate. See PCR results.  Final    Comment: Performed at [facility], [place] [address] [place], [place] [address]  C. Diff by PCR, Reflexed      Status: Abnormal   Collection Time: 04/20/21 10:01 AM  Result Value Ref Range Status   Toxigenic C. Difficile by PCR POSITIVE (A) NEGATIVE Final    Comment: Positive for toxigenic C. difficile with little to no toxin production. Only treat if clinical presentation suggests symptomatic illness. Performed at [facility], [place] [address], [place], [place] [address]           Radiology Studies: US BIOPSY (LIVER)  Result Date: 04/24/2021 INDICATION: 80 year old female with a history multiple liver lesions referred for biopsy EXAM: ULTRASOUND-GUIDED BIOPSY LIVER MASS MEDICATIONS: None. ANESTHESIA/SEDATION: Moderate (conscious) sedation was employed during this procedure. A total of Versed 1.5 mg and Fentanyl 100 mcg was administered intravenously by the radiology nurse. Total intra-service moderate Sedation Time: 10 minutes. The patient's level of consciousness and vital signs were monitored continuously by radiology nursing throughout the procedure under my direct supervision. FLUOROSCOPY TIME:  Ultrasound none COMPLICATIONS: None PROCEDURE: Informed written consent was obtained from the patient after a thorough discussion of the procedural risks, benefits and alternatives. All questions were addressed. Maximal Sterile Barrier Technique was utilized including caps, mask, sterile gowns, sterile gloves, sterile drape, hand hygiene and skin antiseptic. A timeout was performed prior to the initiation of the procedure. Ultrasound survey of the right liver lobe performed with images stored and sent to PACs. The subxiphoid region was prepped with chlorhexidine in a sterile fashion, and a sterile drape was applied covering the operative field. A sterile gown and sterile gloves were used for the procedure. Local anesthesia was provided with 1% Lidocaine. The patient was prepped and draped sterilely and the skin and subcutaneous tissues were generously infiltrated with 1% lidocaine. A 17 gauge introducer  needle was then advanced under ultrasound guidance in an intercostal location into the left liver lobe, targeting left liver mass. The stylet was removed, and multiple separate 18 gauge core biopsy were retrieved. Samples were placed into formalin for transportation to the lab. Gel-Foam pledgets were then infused with a small amount of  saline for assistance with hemostasis. The needle was removed, and a final ultrasound image was performed. The patient tolerated the procedure well and remained hemodynamically stable throughout. No complications were encountered and no significant blood loss was encounter. IMPRESSION: Status post ultrasound-guided biopsy of left liver mass Signed, Dulcy Fanny. Dellia Nims, RPVI [facility] Electronically Signed   By: Corrie Mckusick D.O.   On: 04/24/2021 14:43        Scheduled Meds:  enoxaparin (LOVENOX) injection  40 mg Subcutaneous Daily   feeding supplement  1 Container Oral TID BM   FLUoxetine  20 mg Oral Daily   metoCLOPramide (REGLAN) injection  5 mg Intravenous Q8H   multivitamin with minerals  1 tablet Oral Daily   predniSONE  5 mg Oral Q breakfast   vancomycin  125 mg Oral Q6H   Continuous Infusions:  sodium chloride 50 mL/hr at 04/25/21 0927   famotidine (PEPCID) IV 20 mg (04/25/21 1107)   promethazine (PHENERGAN) injection (IM or IVPB)       LOS: 6 days        Hosie Poisson, MD [facility]   To contact the attending provider between 7A-7P or the covering provider during after hours 7P-7A, please log into the web site [email or web address] and access using universal [place] password for that web site. If you do not have the password, please call the hospital operator.  04/25/2021, 3:27 PM

## 2021-04-25 NOTE — Progress Notes (Incomplete)
Pharmacy: [facility]aps[facility]  Noted from 12/27 Benefit Check that patient's co-pay is $95 for oral Vancomycin.   [facility] offers cheaper pricing if picked up at [employer], [facility], or [facility]. Discussed with patient's sister who prefer [facility] on [address]. Prescription will need to be printed by MD for patient.   Coupon copied below, handed to patient, and placed in patient's AVS. ***  ***

## 2021-04-25 NOTE — Evaluation (Signed)
Physical Therapy Evaluation Patient Details Name: Sara Barnes MRN: [id number] DOB: [birth year] Today's Date: 04/25/2021  History of Present Illness  Patient is a 80 year old female who presented with nausea, vomitting and diarhea. patient was admitted with intractable nausea and vomitting, C diff and  dyspnea. patient is pending biopsy on 12/28 of liver   PMH: crohn's disease, R hemicolectomy, SBO, ileostomy revision.  Clinical Impression  Pt admitted as above and presenting with functional mobility limitations 2* generalized weakness, decreased endurance and ambulatory balance deficits.  Pt hopes to progress to dc home with limited assist of family and would benefit from follow up HHPT to further address deficits - dependent on acute stay progress.     Recommendations for follow up therapy are one component of a multi-disciplinary discharge planning process, led by the attending physician.  Recommendations may be updated based on patient status, additional functional criteria and insurance authorization.  Follow Up Recommendations Home health PT    Assistance Recommended at Discharge Intermittent Supervision/Assistance  Functional Status Assessment Patient has had a recent decline in their functional status and demonstrates the ability to make significant improvements in function in a reasonable and predictable amount of time.  Equipment Recommendations  None recommended by PT    Recommendations for Other Services       Precautions / Restrictions Precautions Precautions: Fall Restrictions Weight Bearing Restrictions: No      Mobility  Bed Mobility               General bed mobility comments: Pt up in recliner and requests back to same    Transfers Overall transfer level: Needs assistance Equipment used: Rolling walker (2 wheels) Transfers: Sit to/from Stand Sit to Stand: Min guard           General transfer comment: Steady assist on initial standing     Ambulation/Gait Ambulation/Gait assistance: Min assist;Min guard Gait Distance (Feet): 180 Feet Assistive device: Rolling walker (2 wheels) Gait Pattern/deviations: Step-to pattern;Step-through pattern;Decreased step length - right;Decreased step length - left;Shuffle;Trunk flexed       General Gait Details: cues for posture, position from RW and pace  Stairs            Wheelchair Mobility    Modified Rankin (Stroke Patients Only)       Balance Overall balance assessment: Needs assistance Sitting-balance support: No upper extremity supported;Feet supported Sitting balance-Leahy Scale: Good     Standing balance support: No upper extremity supported Standing balance-Leahy Scale: Fair                               Pertinent Vitals/Pain Pain Assessment: Faces Faces Pain Scale: Hurts a little bit Pain Location: sternal area from all the nausea Pain Descriptors / Indicators: Sore Pain Intervention(s): Limited activity within patient's tolerance;Monitored during session;Premedicated before session    Home Living Family/patient expects to be discharged to:: Private residence Living Arrangements: Alone Available Help at Discharge: Family;Available PRN/intermittently Type of Home: House Home Access: Level entry     Alternate Level Stairs-Number of Steps: flight Home Layout: Two level;Able to live on main level with bedroom/bathroom Home Equipment: Rolling Walker (2 wheels);Cane - single point;BSC/3in1;Shower seat;Hand held shower head Additional Comments: sister can stay as needed    Prior Function Prior Level of Function : Independent/Modified Independent                     Hand Dominance  Dominant Hand: Right    Extremity/Trunk Assessment   Upper Extremity Assessment Upper Extremity Assessment: Generalized weakness    Lower Extremity Assessment Lower Extremity Assessment: Generalized weakness    Cervical / Trunk  Assessment Cervical / Trunk Assessment: Normal  Communication   Communication: HOH  Cognition Arousal/Alertness: Awake/alert Behavior During Therapy: WFL for tasks assessed/performed Overall Cognitive Status: Within Functional Limits for tasks assessed                                          General Comments      Exercises     Assessment/Plan    PT Assessment Patient needs continued PT services  PT Problem List Decreased strength;Decreased range of motion;Decreased activity tolerance;Decreased balance;Decreased mobility;Decreased knowledge of use of DME       PT Treatment Interventions DME instruction;Gait training;Functional mobility training;Therapeutic activities;Therapeutic exercise;Balance training;Patient/family education    PT Goals (Current goals can be found in the Care Plan section)  Acute Rehab PT Goals Patient Stated Goal: RegainI IND PT Goal Formulation: With patient Time For Goal Achievement: 05/09/21 Potential to Achieve Goals: Good    Frequency Min 3X/week   Barriers to discharge        Co-evaluation               AM-PAC PT "6 Clicks" Mobility  Outcome Measure Help needed turning from your back to your side while in a flat bed without using bedrails?: A Little Help needed moving from lying on your back to sitting on the side of a flat bed without using bedrails?: A Little Help needed moving to and from a bed to a chair (including a wheelchair)?: A Little Help needed standing up from a chair using your arms (e.g., wheelchair or bedside chair)?: A Little Help needed to walk in hospital room?: A Little Help needed climbing 3-5 steps with a railing? : A Lot 6 Click Score: 17    End of Session Equipment Utilized During Treatment: Gait belt Activity Tolerance: Patient limited by fatigue Patient left: in chair;with call bell/phone within reach Nurse Communication: Mobility status PT Visit Diagnosis: Unsteadiness on feet  (R26.81);Muscle weakness (generalized) (M62.81);Difficulty in walking, not elsewhere classified (R26.2)    Time: 5726-2035 PT Time Calculation (min) (ACUTE ONLY): 28 min   Charges:   PT Evaluation $PT Eval Low Complexity: 1 Low PT Treatments $Gait Training: 8-22 mins        Debe Coder PT Acute Rehabilitation Services Pager [phone] Office [phone]   Slayde Brault 04/25/2021, 12:14 PM

## 2021-04-25 NOTE — Progress Notes (Signed)
Patients sister vocalized concerns regarding patients being so weak and having hard time getting up and out of bed, she thinks that the pt needs PT consult.

## 2021-04-26 ENCOUNTER — Inpatient Hospital Stay (HOSPITAL_COMMUNITY): Payer: Medicare HMO

## 2021-04-26 DIAGNOSIS — R112 Nausea with vomiting, unspecified: Secondary | ICD-10-CM | POA: Diagnosis not present

## 2021-04-26 LAB — BASIC METABOLIC PANEL
Anion gap: 11 (ref 5–15)
Anion gap: 8 (ref 5–15)
BUN: 28 mg/dL — ABNORMAL HIGH (ref 8–23)
BUN: 31 mg/dL — ABNORMAL HIGH (ref 8–23)
CO2: 24 mmol/L (ref 22–32)
CO2: 20 mmol/L — ABNORMAL LOW (ref 22–32)
Calcium: 8.7 mg/dL — ABNORMAL LOW (ref 8.9–10.3)
Calcium: 8.2 mg/dL — ABNORMAL LOW (ref 8.9–10.3)
Chloride: 94 mmol/L — ABNORMAL LOW (ref 98–111)
Chloride: 98 mmol/L (ref 98–111)
Creatinine, Ser: 2.28 mg/dL — ABNORMAL HIGH (ref 0.44–1.00)
Creatinine, Ser: 2.15 mg/dL — ABNORMAL HIGH (ref 0.44–1.00)
GFR, Estimated: 21 mL/min — ABNORMAL LOW (ref >60)
GFR, Estimated: 23 mL/min — ABNORMAL LOW (ref >60)
Glucose, Bld: 120 mg/dL — ABNORMAL HIGH (ref 70–99)
Glucose, Bld: 121 mg/dL — ABNORMAL HIGH (ref 70–99)
Potassium: 4.2 mmol/L (ref 3.5–5.1)
Potassium: 3.5 mmol/L (ref 3.5–5.1)
Sodium: 129 mmol/L — ABNORMAL LOW (ref 135–145)
Sodium: 126 mmol/L — ABNORMAL LOW (ref 135–145)

## 2021-04-26 LAB — URINALYSIS, ROUTINE W REFLEX MICROSCOPIC
Bilirubin Urine: NEGATIVE
Glucose, UA: NEGATIVE mg/dL
Ketones, ur: NEGATIVE mg/dL
Leukocytes,Ua: NEGATIVE
Nitrite: NEGATIVE
Protein, ur: NEGATIVE mg/dL
Specific Gravity, Urine: 1.014 (ref 1.005–1.030)
pH: 5 (ref 5.0–8.0)

## 2021-04-26 LAB — TSH: TSH: 1.286 u[IU]/mL (ref 0.350–4.500)

## 2021-04-26 LAB — TROPONIN I (HIGH SENSITIVITY): Troponin I (High Sensitivity): 27 ng/L — ABNORMAL HIGH (ref <18)

## 2021-04-26 MED ORDER — METOPROLOL TARTRATE 5 MG/5ML IV SOLN
5.0000 mg | Freq: Once | INTRAVENOUS | Status: AC
  Administered 2021-04-26: 18:00:00 5 mg via INTRAVENOUS
  Filled 2021-04-26: qty 5

## 2021-04-26 MED ORDER — ALBUMIN HUMAN 25 % IV SOLN
25.0000 g | Freq: Four times a day (QID) | INTRAVENOUS | Status: DC
  Administered 2021-04-26 – 2021-04-27 (×3): 25 g via INTRAVENOUS
  Filled 2021-04-26 (×4): qty 100

## 2021-04-26 MED ORDER — ENOXAPARIN SODIUM 30 MG/0.3ML IJ SOSY
30.0000 mg | PREFILLED_SYRINGE | Freq: Every day | INTRAMUSCULAR | Status: DC
  Administered 2021-04-27 – 2021-04-28 (×2): 30 mg via SUBCUTANEOUS
  Filled 2021-04-26 (×2): qty 0.3

## 2021-04-26 MED ORDER — FUROSEMIDE 10 MG/ML IJ SOLN
20.0000 mg | Freq: Once | INTRAMUSCULAR | Status: AC
  Administered 2021-04-26: 18:00:00 20 mg via INTRAVENOUS
  Filled 2021-04-26: qty 2

## 2021-04-26 MED ORDER — PROSOURCE PLUS PO LIQD
30.0000 mL | Freq: Two times a day (BID) | ORAL | Status: DC
  Administered 2021-04-29 – 2021-05-01 (×4): 30 mL via ORAL
  Filled 2021-04-26 (×9): qty 30

## 2021-04-26 MED ORDER — ZOLPIDEM TARTRATE 5 MG PO TABS
5.0000 mg | ORAL_TABLET | Freq: Every evening | ORAL | Status: DC | PRN
  Administered 2021-04-26: 23:00:00 5 mg via ORAL
  Filled 2021-04-26: qty 1

## 2021-04-26 MED ORDER — METOPROLOL TARTRATE 25 MG PO TABS
12.5000 mg | ORAL_TABLET | Freq: Two times a day (BID) | ORAL | Status: DC
  Administered 2021-04-26 – 2021-04-28 (×4): 12.5 mg via ORAL
  Filled 2021-04-26 (×4): qty 1

## 2021-04-26 NOTE — Progress Notes (Signed)
Mobility Specialist - Progress Note    04/26/21 1200  Mobility  Activity Ambulated in hall  Level of Assistance Contact guard assist, steadying assist  Assistive Device Front wheel walker  Distance Ambulated (ft) 280 ft  Mobility Ambulated with assistance in hallway  Mobility Response Tolerated well  Mobility performed by Mobility specialist  $Mobility charge 1 Mobility   Upon entry pt was agreeable to mobilize, required contact guard A to help stand from recliner. Stated she "didn't feel good" and sat back down. After 1 minute rest, pt stood from recliner and used RW to ambulate 280 ft in hall. Pt reported feeling SOB and began to wheeze, was encouraged to take standing rest breaks and practice pursed breathing. In total, pt required 4 standing rest breaks to help with breathing and verbal cues to avoid bumping into objects in hallway. At EOS, pt returned to recliner and was left sitting up with family in room.  [place] Specialist Acute Rehabilitation Services Phone: [phone] 04/26/21, 12:04 PM

## 2021-04-26 NOTE — Progress Notes (Signed)
Nutrition Follow-up  INTERVENTION:   -Boost Breeze po TID, each supplement provides 250 kcal and 9 grams of protein  -Prosource Plus PO BID, each provides 100 kcals and 15g protein  -Multivitamin with minerals daily  NUTRITION DIAGNOSIS:   Inadequate oral intake related to acute illness as evidenced by per patient/family report.  Ongoing.  GOAL:   Patient will meet greater than or equal to 90% of their needs  Progressing.  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  Patient in room, no family present at bedside. RD about to gown up to enter room but pt asked that RD come back at a later time, "I don't feel good".  Per RN outside of room, states pt has not been in a good mood today, refused PT as well. Per chart review, pt's last BM 12/28.  Pt only consumed jello yesterday. Not drinking Boost Breeze. Will add Prosource for pt to try.   Admission weight: 158 lbs. No other weights this admission.  Medications: Reglan, Multivitamin with minerals daily, Pepcid  Labs reviewed: Low Na   NUTRITION - FOCUSED PHYSICAL EXAM:  Pt declined RD visit  Diet Order:   Diet Order             Diet regular Room service appropriate? Yes; Fluid consistency: Thin  Diet effective now                   EDUCATION NEEDS:   Education needs have been addressed  Skin:  Skin  Assessment: Reviewed RN Assessment  Last BM:  12/28 -type 6  Height:   Ht Readings from Last 1 Encounters:  04/19/21 5' 6"  (1.676 m)    Weight:   Wt Readings from Last 1 Encounters:  04/19/21 72.1 kg    Ideal Body Weight:  59 kg  BMI:  Body mass index is 25.66 kg/m.  Estimated Nutritional Needs:   Kcal:  1700-1900kcal/day  Protein:  85-95g/day  Fluid:  1.5-1.8L/day   Clayton Bibles, MS, RD, LDN Inpatient Clinical Dietitian Contact information available via Amion

## 2021-04-26 NOTE — Progress Notes (Signed)
PT Cancellation Note  Patient Details Name: Sara Barnes MRN: [id number] DOB: 03-Jul-1940   Cancelled Treatment:    Reason Eval/Treat Not Completed: Other (comment)  Pt declined due to pain.  Will f/u as able. Sara Barnes, PT Acute Rehab Services Pager [phone] [facility] [phone]  Sara Barnes 04/26/2021, 4:42 PM

## 2021-04-26 NOTE — Progress Notes (Addendum)
Patients heart rate 129 making patient a yellow MEWS . EKG completed showing afib with RVR, MD notified, new orders placed transfer order placed. Yellow MEWS protocol initiated.

## 2021-04-26 NOTE — Progress Notes (Addendum)
Patient requested this RN to hold her Nacl fluid infusion, per patient she's scared because she's wheezing. Sh[place], MD on call made aware.

## 2021-04-26 NOTE — Progress Notes (Addendum)
Prn albuterol given to patient for wheezing and restarted Nacl @50ml /hr per Dr. Cyd Silence.

## 2021-04-26 NOTE — Progress Notes (Signed)
Report called to [facility] on 4th floor progressive care.

## 2021-04-26 NOTE — Progress Notes (Signed)
PROGRESS NOTE    Sara Barnes  JJH:[id number] DOB: [birth year] DOA: 04/19/2021 PCP: Ann Held, DO   Brief Narrative: The patient is a 80 year old Caucasian female with a past medical history significant for Crohn's disease, depression, insomnia, dyspepsia admitted with C. difficile colitis with complaints of nausea vomiting and diarrhea.  She is status post biopsy of the liver.   Assessment & Plan:   Principal Problem:   Intractable nausea and vomiting Active Problems:   Depression   Diarrhea   Crohn disease (HCC)   Hyponatremia   Clostridium difficile colitis   #1 C. difficile colitis patient presented with nausea vomiting and diarrhea on oral vancomycin with improvement in her diarrhea.  However she remains very weak and unable to ambulate by herself with a walker.  Will consult physical therapy she might need skilled nursing rehab on discharge.    #2 chest discomfort and shortness of breath check chest x-ray Check EKG these were done in the past EKG was unremarkable chest x-ray showed pleural effusion which was thought to be secondary to IV hydration.  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  Now with CT of the abdomen showing new hepatic lesions suspicious for metastatic cancer.  She is status post biopsy on 04/24/2021.  #4 hypokalemia replace and recheck  #5 history of anxiety and depression continue home meds  #6 hyponatremia sodium 129 unclear etiology  #7 AKI creatinine jumped up to 2.28 from 0.9.  Recheck BMP this evening check renal ultrasound rule out any obstruction.  Will consult renal if renal functions continue to trend up. Check bladder scan   Nutrition Problem: Inadequate oral intake Etiology: acute illness     Signs/Symptoms: per patient/family report       Estimated body mass index is 25.66 kg/m as calculated from the following:   Height as of this encounter: 5' 6"  (1.676 m).   Weight  as of this encounter: 72.1 kg.  DVT prophylaxis: Lovenox  code Status: Full code Family Communication: Discussed with sister on the phone.   Disposition Plan:  Status is: Inpatient  Remains inpatient appropriate because: Needs IV fluids   Consultants:  Oncology interventional radiology  Procedures: Biopsy of the liver lesion Antimicrobials: P.o. vancomycin  Subjective: Patient is resting in bed complaining of difficulty in her chest and pain diarrhea is better however she is very weak creatinine 2.28 today  Objective: Vitals:   04/26/21 0346 04/26/21 0541 04/26/21 0612 04/26/21 1401  BP:  130/63  (!) 142/78  Pulse: 100 (!) 104 100 94  Resp: (!) 22 (!) 24 (!) 21 18  Temp:  97.8 F (36.6 C)  98.1 F (36.7 C)  TempSrc:  Oral    SpO2: 98% 90% 97% 96%  Weight:      Height:        Intake/Output Summary (Last 24 hours) at 04/26/2021 1452 Last data filed at 04/26/2021 1236 Gross per 24 hour  Intake 1470.08 ml  Output 85 ml  Net 1385.08 ml   Filed Weights   04/19/21 2130  Weight: 72.1 kg    Examination:  General exam: Appears chronically ill looking frail elderly female Respiratory system: Diminished breath sounds at the bases to auscultation. Respiratory effort normal. Cardiovascular system: S1 & S2 heard, RRR. No JVD, murmurs, rubs, gallops or clicks. No pedal edema. Gastrointestinal system: Abdomen is nondistended, soft and nontender. No organomegaly or masses felt. Normal bowel sounds heard. Central nervous system:  Alert and oriented. No focal neurological deficits. Extremities: Symmetric 5 x 5 power. Skin: No rashes, lesions or ulcers Psychiatry: Judgement and insight appear normal. Mood & affect appropriate.     Data Reviewed: I have personally reviewed following labs and imaging studies  CBC: Recent Labs  Lab 04/20/21 0439 04/21/21 0458 04/22/21 0407 04/23/21 0415 04/24/21 0432  WBC 6.2 6.4 8.5 7.6 9.0  NEUTROABS  --  4.2 5.9 5.4 5.9  HGB 12.8 13.0  12.8 13.4 14.9  HCT 38.2 38.0 38.4 40.2 43.7  MCV 93.6 93.6 94.8 93.1 92.4  PLT 213 213 200 229 338   Basic Metabolic Panel: Recent Labs  Lab 04/20/21 0439 04/21/21 0458 04/22/21 0407 04/23/21 0415 04/24/21 0432 04/26/21 0435  NA 134* 134* 133* 133* 132* 129*  K 3.8 3.4* 4.4 3.6 3.5 4.2  CL 104 103 102 99 98 94*  CO2 23 25 26 28 24 24   GLUCOSE 85 92 90 96 102* 120*  BUN 7* 8 9 9 11  28*  CREATININE 0.76 0.73 0.85 0.93 0.97 2.28*  CALCIUM 8.4* 8.5* 8.6* 8.6* 9.1 8.7*  MG 1.8 1.9 2.2 2.0 2.0  --   PHOS 3.4 3.3 3.4 4.8* 4.5  --    GFR: Estimated Creatinine Clearance: 20.3 mL/min (A) (by C-G formula based on SCr of 2.28 mg/dL (H)). Liver Function Tests: Recent Labs  Lab 04/20/21 0439 04/21/21 0458 04/22/21 0407 04/23/21 0415 04/24/21 0432  AST 18 16 16 18 22   ALT 14 13 11 14 19   ALKPHOS 66 70 65 67 80  BILITOT 0.7 0.5 0.6 0.4 0.6  PROT 5.3* 5.4* 5.5* 6.2* 7.2  ALBUMIN 2.8* 2.9* 2.9* 3.4* 3.7   No results for input(s): LIPASE, AMYLASE in the last 168 hours. No results for input(s): AMMONIA in the last 168 hours. Coagulation Profile: Recent Labs  Lab 04/23/21 0415  INR 1.0   Cardiac Enzymes: No results for input(s): CKTOTAL, CKMB, CKMBINDEX, TROPONINI in the last 168 hours. BNP (last 3 results) No results for input(s): PROBNP in the last 8760 hours. HbA1C: No results for input(s): HGBA1C in the last 72 hours. CBG: No results for input(s): GLUCAP in the last 168 hours. Lipid Profile: No results for input(s): CHOL, HDL, LDLCALC, TRIG, CHOLHDL, LDLDIRECT in the last 72 hours. Thyroid Function Tests: No results for input(s): TSH, T4TOTAL, FREET4, T3FREE, THYROIDAB in the last 72 hours. Anemia Panel: No results for input(s): VITAMINB12, FOLATE, FERRITIN, TIBC, IRON, RETICCTPCT in the last 72 hours. Sepsis Labs: No results for input(s): PROCALCITON, LATICACIDVEN in the last 168 hours.  Recent Results (from the past 240 hour(s))  Resp Panel by RT-PCR (Flu A&B,  Covid) Nasopharyngeal Swab     Status: None   Collection Time: 04/19/21 12:48 PM   Specimen: Nasopharyngeal Swab; Nasopharyngeal(NP) swabs in vial transport medium  Result Value Ref Range Status   SARS Coronavirus 2 by RT PCR NEGATIVE NEGATIVE Final    Comment: (NOTE) SARS-CoV-2 target nucleic acids are NOT DETECTED.  The SARS-CoV-2 RNA is generally detectable in upper respiratory specimens during the acute phase of infection. The lowest concentration of SARS-CoV-2 viral copies this assay can detect is 138 copies/mL. A negative result does not preclude SARS-Cov-2 infection and should not be used as the sole basis for treatment or other patient management decisions. A negative result may occur with  improper specimen collection/handling, submission of specimen other than nasopharyngeal swab, presence of viral mutation(s) within the areas targeted by this assay, and inadequate number of viral copies(<138  copies/mL). A negative result must be combined with clinical observations, patient history, and epidemiological information. The expected result is Negative.  Fact Sheet for Patients:  EntrepreneurPulse.com.au  Fact Sheet for Healthcare Providers:  IncredibleEmployment.be  This test is no t yet approved or cleared by the [place] FDA and  has been authorized for detection and/or diagnosis of SARS-CoV-2 by FDA under an Emergency Use Authorization (EUA). This EUA will remain  in effect (meaning this test can be used) for the duration of the COVID-19 declaration under Section 564(b)(1) of the Act, 21 U.S.C.section 360bbb-3(b)(1), unless the authorization is terminated  or revoked sooner.       Influenza A by PCR NEGATIVE NEGATIVE Final   Influenza B by PCR NEGATIVE NEGATIVE Final    Comment: (NOTE) The Xpert Xpress SARS-CoV-2/FLU/RSV plus assay is intended as an aid in the diagnosis of influenza from Nasopharyngeal swab specimens and should  not be used as a sole basis for treatment. Nasal washings and aspirates are unacceptable for Xpert Xpress SARS-CoV-2/FLU/RSV testing.  Fact Sheet for Patients: EntrepreneurPulse.com.au  Fact Sheet for Healthcare Providers: IncredibleEmployment.be  This test is not yet approved or cleared by the [place] FDA and has been authorized for detection and/or diagnosis of SARS-CoV-2 by FDA under an Emergency Use Authorization (EUA). This EUA will remain in effect (meaning this test can be used) for the duration of the COVID-19 declaration under Section 564(b)(1) of the Act, 21 U.S.C. section 360bbb-3(b)(1), unless the authorization is terminated or revoked.  Performed at [facility], [place], [place] [address], [place], [place] [address]   Urine Culture     Status: None   Collection Time: 04/19/21 12:56 PM   Specimen: Urine, Clean Catch  Result Value Ref Range Status   Specimen Description   Final    URINE, CLEAN CATCH Performed at [facility], [place] [address] [place], [place] [address]    Special Requests   Final    NONE Performed at [facility], [place] [address] [place], [place] [address]    Culture   Final    NO GROWTH Performed at [facility], [place] [address], [place],  [address]    Report Status 04/20/2021 FINAL  Final  Gastrointestinal Panel by PCR , Stool     Status: None   Collection Time: 04/20/21 10:01 AM   Specimen: STOOL  Result Value Ref Range Status   Campylobacter species NOT DETECTED NOT DETECTED Final   Plesimonas shigelloides NOT DETECTED NOT DETECTED Final   Salmonella species NOT DETECTED NOT DETECTED Final   Yersinia enterocolitica NOT DETECTED NOT DETECTED Final   Vibrio species NOT DETECTED NOT DETECTED Final   Vibrio cholerae NOT DETECTED NOT DETECTED Final   Enteroaggregative E coli (EAEC) NOT DETECTED NOT DETECTED Final   Enteropathogenic E coli (EPEC)  NOT DETECTED NOT DETECTED Final   Enterotoxigenic E coli (ETEC) NOT DETECTED NOT DETECTED Final   Shiga like toxin producing E coli (STEC) NOT DETECTED NOT DETECTED Final   Shigella/Enteroinvasive E coli (EIEC) NOT DETECTED NOT DETECTED Final   Cryptosporidium NOT DETECTED NOT DETECTED Final   Cyclospora cayetanensis NOT DETECTED NOT DETECTED Final   Entamoeba histolytica NOT DETECTED NOT DETECTED Final   Giardia lamblia NOT DETECTED NOT DETECTED Final   Adenovirus F40/41 NOT DETECTED NOT DETECTED Final   Astrovirus NOT DETECTED NOT DETECTED Final   Norovirus GI/GII NOT DETECTED NOT DETECTED Final   Rotavirus A NOT DETECTED NOT DETECTED Final  Sapovirus (I, II, IV, and V) NOT DETECTED NOT DETECTED Final    Comment: Performed at [facility], [place], [place] [address]  C Difficile Quick Screen w PCR reflex     Status: Abnormal   Collection Time: 04/20/21 10:01 AM   Specimen: STOOL  Result Value Ref Range Status   C Diff antigen POSITIVE (A) NEGATIVE Final   C Diff toxin NEGATIVE NEGATIVE Final   C Diff interpretation Results are indeterminate. See PCR results.  Final    Comment: Performed at [facility], [place] [address], [place], [place] [address]  C. Diff by PCR, Reflexed     Status: Abnormal   Collection Time: 04/20/21 10:01 AM  Result Value Ref Range Status   Toxigenic C. Difficile by PCR POSITIVE (A) NEGATIVE Final    Comment: Positive for toxigenic C. difficile with little to no toxin production. Only treat if clinical presentation suggests symptomatic illness. Performed at [facility], [place] [address] [place], [place] [address]          Radiology Studies: No results found.      Scheduled Meds:  enoxaparin (LOVENOX) injection  40 mg Subcutaneous Daily   feeding supplement  1 Container Oral TID BM   FLUoxetine  20 mg Oral Daily   metoCLOPramide (REGLAN) injection  5 mg Intravenous Q8H   multivitamin with minerals   1 tablet Oral Daily   predniSONE  5 mg Oral Q breakfast   vancomycin  125 mg Oral Q6H   Continuous Infusions:  sodium chloride 50 mL/hr at 04/26/21 0643   famotidine (PEPCID) IV 20 mg (04/26/21 1136)   promethazine (PHENERGAN) injection (IM or IVPB)       LOS: 7 days    Time spent: 45 minutes  Georgette Shell, MD  04/26/2021, 2:52 PM

## 2021-04-26 NOTE — Progress Notes (Addendum)
°   04/26/21 0541  Vitals  Temp 97.8 F (36.6 C)  Temp Source Oral  BP 130/63  MAP (mmHg) 81  BP Location Right Arm  BP Method Automatic  Patient Position (if appropriate) Lying  Pulse Rate (!) 104  Pulse Rate Source Monitor  Resp (!) 24  Level of Consciousness  Level of Consciousness Alert  MEWS COLOR  MEWS Score Color Yellow  Oxygen Therapy  SpO2 90 %  O2 Device Room Air  Patient Activity (if Appropriate) In bed  Pulse Oximetry Type Intermittent  Glasgow Coma Scale  Eye Opening 4  Best Verbal Response (NON-intubated) 5  Best Motor Response 6  Glasgow Coma Scale Score 15  MEWS Score  MEWS Temp 0  MEWS Systolic 0  MEWS Pulse 1  MEWS RR 1  MEWS LOC 0  MEWS Score 2   Patient alert oriented x4 with vital signs shown above. Yellow MEWS, with high RR and HR. Patient in pain with pain med given and recently received breathing treatment. See MAR for PRN medications given.

## 2021-04-26 NOTE — Progress Notes (Signed)
Awaiting progressive care bed to become available.

## 2021-04-27 ENCOUNTER — Inpatient Hospital Stay (HOSPITAL_COMMUNITY): Payer: Medicare HMO

## 2021-04-27 DIAGNOSIS — R9431 Abnormal electrocardiogram [ECG] [EKG]: Secondary | ICD-10-CM | POA: Diagnosis not present

## 2021-04-27 DIAGNOSIS — R112 Nausea with vomiting, unspecified: Secondary | ICD-10-CM | POA: Diagnosis not present

## 2021-04-27 LAB — BASIC METABOLIC PANEL
Anion gap: 11 (ref 5–15)
BUN: 29 mg/dL — ABNORMAL HIGH (ref 8–23)
CO2: 24 mmol/L (ref 22–32)
Calcium: 9 mg/dL (ref 8.9–10.3)
Chloride: 98 mmol/L (ref 98–111)
Creatinine, Ser: 1.77 mg/dL — ABNORMAL HIGH (ref 0.44–1.00)
GFR, Estimated: 29 mL/min — ABNORMAL LOW (ref >60)
Glucose, Bld: 95 mg/dL (ref 70–99)
Potassium: 3.5 mmol/L (ref 3.5–5.1)
Sodium: 133 mmol/L — ABNORMAL LOW (ref 135–145)

## 2021-04-27 LAB — ECHOCARDIOGRAM COMPLETE
Area-P 1/2: 3.99 cm2
Height: 66 in
S' Lateral: 2.3 cm
Weight: 2543.23 oz

## 2021-04-27 MED ORDER — FUROSEMIDE 10 MG/ML IJ SOLN
20.0000 mg | Freq: Once | INTRAMUSCULAR | Status: AC
Start: 2021-04-27 — End: 2021-04-27
  Administered 2021-04-27: 20 mg via INTRAVENOUS
  Filled 2021-04-27: qty 2

## 2021-04-27 MED ORDER — HYDROMORPHONE HCL 1 MG/ML IJ SOLN: 0.5000 mg | Freq: Four times a day (QID) | INTRAMUSCULAR | Status: DC | PRN

## 2021-04-27 MED ORDER — ZOLPIDEM TARTRATE 10 MG PO TABS
10.0000 mg | ORAL_TABLET | Freq: Every evening | ORAL | Status: DC | PRN
  Administered 2021-04-27 – 2021-05-15 (×19): 10 mg via ORAL
  Filled 2021-04-27 (×2): qty 2
  Filled 2021-04-27 (×3): qty 1
  Filled 2021-04-27 (×2): qty 2
  Filled 2021-04-27 (×5): qty 1
  Filled 2021-04-27: qty 2
  Filled 2021-04-27 (×6): qty 1

## 2021-04-27 MED ORDER — ALBUMIN HUMAN 25 % IV SOLN
25.0000 g | Freq: Four times a day (QID) | INTRAVENOUS | Status: AC
  Administered 2021-04-27 – 2021-04-28 (×4): 25 g via INTRAVENOUS
  Filled 2021-04-27 (×4): qty 100

## 2021-04-27 MED ORDER — HYDROMORPHONE HCL 1 MG/ML IJ SOLN
0.5000 mg | INTRAMUSCULAR | Status: DC | PRN
  Administered 2021-04-27 – 2021-04-29 (×10): 0.5 mg via INTRAVENOUS
  Filled 2021-04-27 (×10): qty 0.5

## 2021-04-27 NOTE — Progress Notes (Signed)
°  Echocardiogram 2D Echocardiogram has been performed.  Sara Barnes M 04/27/2021, 8:25 AM

## 2021-04-27 NOTE — Progress Notes (Signed)
Pt requested to have CT done in morning instead of tonight. RN educated pt on reason CT was ordered. RN notified CT staff of pt's desire.

## 2021-04-27 NOTE — Progress Notes (Signed)
Physical Therapy Treatment Patient Details Name: Sara Barnes MRN: [id number] DOB: 04/21/1941 Today's Date: 04/27/2021   History of Present Illness Patient is a 80 year old female who presented with nausea, vomitting and diarhea. patient was admitted with intractable nausea and vomitting, C diff and  dyspnea. patient is pending biopsy on 12/28 of liver   PMH: crohn's disease, R hemicolectomy, SBO, ileostomy revision.    PT Comments    Pt required encouragement from sister to participate. Overall, she was Supv-Min guard assist for mobility. RR 33 HR 95 bpm. Audible wheezing with ambulation. Pt is being followed by PT and mobility team. *Highly recommend nursing assist with ambulation in addition to PT sessions to encourage increased activity**   Recommendations for follow up therapy are one component of a multi-disciplinary discharge planning process, led by the attending physician.  Recommendations may be updated based on patient status, additional functional criteria and insurance authorization.  Follow Up Recommendations  Home health PT     Assistance Recommended at Discharge Intermittent Supervision/Assistance  Equipment Recommendations  None recommended by PT    Recommendations for Other Services       Precautions / Restrictions Precautions Precautions: Fall Restrictions Weight Bearing Restrictions: No     Mobility  Bed Mobility Overal bed mobility: Modified Independent                  Transfers Overall transfer level: Needs assistance Equipment used: Rolling walker (2 wheels) Transfers: Sit to/from Stand Sit to Stand: Supervision           General transfer comment: Supv for safety    Ambulation/Gait Ambulation/Gait assistance: Min guard Gait Distance (Feet): 100 Feet Assistive device: Rolling walker (2 wheels)         General Gait Details: Min guard for safety. Slow but steady pace. RR 33 HR 95 bpm. Wheezing heard after ~75 feet   Stairs              Wheelchair Mobility    Modified Rankin (Stroke Patients Only)       Balance Overall balance assessment: Needs assistance         Standing balance support: Bilateral upper extremity supported Standing balance-Leahy Scale: Fair                              Cognition Arousal/Alertness: Awake/alert Behavior During Therapy: WFL for tasks assessed/performed Overall Cognitive Status: Within Functional Limits for tasks assessed                                          Exercises      General Comments        Pertinent Vitals/Pain Faces Pain Scale: Hurts a little bit Pain Location: sternal area from all the nausea Pain Descriptors / Indicators: Sore Pain Intervention(s): Monitored during session    Home Living                          Prior Function            PT Goals (current goals can now be found in the care plan section) Progress towards PT goals: Progressing toward goals    Frequency    Min 3X/week      PT Plan Current plan remains appropriate    Co-evaluation  AM-PAC PT "6 Clicks" Mobility   Outcome Measure  Help needed turning from your back to your side while in a flat bed without using bedrails?: None Help needed moving from lying on your back to sitting on the side of a flat bed without using bedrails?: None Help needed moving to and from a bed to a chair (including a wheelchair)?: A Little Help needed standing up from a chair using your arms (e.g., wheelchair or bedside chair)?: A Little Help needed to walk in hospital room?: A Little Help needed climbing 3-5 steps with a railing? : A Little 6 Click Score: 20    End of Session Equipment Utilized During Treatment: Gait belt Activity Tolerance: Patient limited by fatigue Patient left: in bed;with call bell/phone within reach;with family/visitor present   PT Visit Diagnosis: Difficulty in walking, not elsewhere  classified (R26.2)     Time: 0315-9458 PT Time Calculation (min) (ACUTE ONLY): 14 min  Charges:  $Gait Training: 8-22 mins              Doreatha Massed, PT Acute Rehabilitation  Office: [phone] Pager: [phone]

## 2021-04-27 NOTE — Progress Notes (Signed)
PROGRESS NOTE    Sara Barnes  JSE:[id number] DOB: 02/24/1941 DOA: 04/19/2021 PCP: Ann Held, DO   Brief Narrative: The patient is a 80 year old Caucasian female with a past medical history significant for Crohn's disease, depression, insomnia, dyspepsia admitted with C. difficile colitis with complaints of nausea vomiting and diarrhea.  She is status post biopsy of the liver.   Assessment & Plan:   Principal Problem:   Intractable nausea and vomiting Active Problems:   Depression   Diarrhea   Crohn disease (HCC)   Hyponatremia   Clostridium difficile colitis   #1 C. difficile colitis patient presented with nausea vomiting and diarrhea on oral vancomycin with improvement in her diarrhea.  However she remains very weak and unable to ambulate by herself with a walker.      #2 chest discomfort and shortness of breath -chest x-ray revealed new small left effusion and right pleural effusion.  EKG was in A. fib RVR she was given Lopressor 5 mg IV and was continued on metoprolol twice daily.  Today she is back to sinus rhythm and rate controlled.  Her troponin was mildly elevated at 27 which is likely due to demand ischemia.  Was also given a dose of Lasix with improvement in her breathing. Echo pending TSH normal  #3 history of metastatic breast cancer with mets to the chest wall that has been excised.  Now with CT of the abdomen showing new hepatic lesions suspicious for metastatic cancer.  She is status post biopsy on 04/24/2021.  #4 hypokalemia resolved  #5 history of anxiety and depression continue home meds  #6 hyponatremia likely multifactorial sodium improved with normal saline to 133.  However she was volume overloaded with third spacing and hypoalbuminemia.  IV fluids were then stopped.  Monitor sodium daily.    #7 AKI creatinine 1.77 from 2.28.  Renal ultrasound shows no evidence of hydronephrosis.    #8 insomnia patient has been taking Ambien 10 mg at bedtime for  many number of years and she claims that she cannot sleep without it.  Nutrition Problem: Inadequate oral intake Etiology: acute illness  Signs/Symptoms: per patient/family report  Interventions: Boost Breeze, Prostat  Estimated body mass index is 25.66 kg/m as calculated from the following:   Height as of this encounter: 5' 6"  (1.676 m).   Weight as of this encounter: 72.1 kg.  DVT prophylaxis: Lovenox  code Status: Full code Family Communication: None at bedside today.  Discussed with sister on the phone yesterday.   Disposition Plan:  Status is: Inpatient  Remains inpatient appropriate because: Needs IV fluids   Consultants:  Oncology interventional radiology  Procedures: Biopsy of the liver lesion Antimicrobials: P.o. vancomycin  Subjective: She is resting in bed reports having a bad night however her heart rate has been down to normal and normal sinus rhythm she feels her breathing is better Echo pending Objective: Vitals:   04/26/21 2000 04/27/21 0000 04/27/21 0527 04/27/21 0825  BP: 122/87 (!) 100/54 121/64 126/74  Pulse: (!) 130 83 83 87  Resp: 20 20 18 18   Temp: 98.5 F (36.9 C) 98 F (36.7 C) 98 F (36.7 C) 98.5 F (36.9 C)  TempSrc: Oral Oral  Oral  SpO2: 93% 94% 96% 98%  Weight:      Height:        Intake/Output Summary (Last 24 hours) at 04/27/2021 1349 Last data filed at 04/27/2021 0300 Gross per 24 hour  Intake 360 ml  Output 300 ml  Net 60 ml    Filed Weights   04/19/21 2130  Weight: 72.1 kg    Examination:  General exam: Appears chronically ill looking frail elderly female Respiratory system: Diminished breath sounds at the bases to auscultation. Respiratory effort normal. Cardiovascular system: S1 & S2 heard, RRR. No JVD, murmurs, rubs, gallops or clicks. No pedal edema. Gastrointestinal system: Abdomen is nondistended, soft and nontender. No organomegaly or masses felt. Normal bowel sounds heard. Central nervous system: Alert and  oriented. No focal neurological deficits. Extremities: Symmetric 5 x 5 power. Skin: No rashes, lesions or ulcers Psychiatry: Judgement and insight appear normal. Mood & affect appropriate.     Data Reviewed: I have personally reviewed following labs and imaging studies  CBC: Recent Labs  Lab 04/21/21 0458 04/22/21 0407 04/23/21 0415 04/24/21 0432  WBC 6.4 8.5 7.6 9.0  NEUTROABS 4.2 5.9 5.4 5.9  HGB 13.0 12.8 13.4 14.9  HCT 38.0 38.4 40.2 43.7  MCV 93.6 94.8 93.1 92.4  PLT 213 200 229 703    Basic Metabolic Panel: Recent Labs  Lab 04/21/21 0458 04/22/21 0407 04/23/21 0415 04/24/21 0432 04/26/21 0435 04/26/21 1647 04/27/21 0530  NA 134* 133* 133* 132* 129* 126* 133*  K 3.4* 4.4 3.6 3.5 4.2 3.5 3.5  CL 103 102 99 98 94* 98 98  CO2 25 26 28 24 24  20* 24  GLUCOSE 92 90 96 102* 120* 121* 95  BUN 8 9 9 11  28* 31* 29*  CREATININE 0.73 0.85 0.93 0.97 2.28* 2.15* 1.77*  CALCIUM 8.5* 8.6* 8.6* 9.1 8.7* 8.2* 9.0  MG 1.9 2.2 2.0 2.0  --   --   --   PHOS 3.3 3.4 4.8* 4.5  --   --   --     GFR: Estimated Creatinine Clearance: 26.2 mL/min (A) (by C-G formula based on SCr of 1.77 mg/dL (H)). Liver Function Tests: Recent Labs  Lab 04/21/21 0458 04/22/21 0407 04/23/21 0415 04/24/21 0432  AST 16 16 18 22   ALT 13 11 14 19   ALKPHOS 70 65 67 80  BILITOT 0.5 0.6 0.4 0.6  PROT 5.4* 5.5* 6.2* 7.2  ALBUMIN 2.9* 2.9* 3.4* 3.7    No results for input(s): LIPASE, AMYLASE in the last 168 hours. No results for input(s): AMMONIA in the last 168 hours. Coagulation Profile: Recent Labs  Lab 04/23/21 0415  INR 1.0    Cardiac Enzymes: No results for input(s): CKTOTAL, CKMB, CKMBINDEX, TROPONINI in the last 168 hours. BNP (last 3 results) No results for input(s): PROBNP in the last 8760 hours. HbA1C: No results for input(s): HGBA1C in the last 72 hours. CBG: No results for input(s): GLUCAP in the last 168 hours. Lipid Profile: No results for input(s): CHOL, HDL, LDLCALC,  TRIG, CHOLHDL, LDLDIRECT in the last 72 hours. Thyroid Function Tests: Recent Labs    04/26/21 1856  TSH 1.286   Anemia Panel: No results for input(s): VITAMINB12, FOLATE, FERRITIN, TIBC, IRON, RETICCTPCT in the last 72 hours. Sepsis Labs: No results for input(s): PROCALCITON, LATICACIDVEN in the last 168 hours.  Recent Results (from the past 240 hour(s))  Resp Panel by RT-PCR (Flu A&B, Covid) Nasopharyngeal Swab     Status: None   Collection Time: 04/19/21 12:48 PM   Specimen: Nasopharyngeal Swab; Nasopharyngeal(NP) swabs in vial transport medium  Result Value Ref Range Status   SARS Coronavirus 2 by RT PCR NEGATIVE NEGATIVE Final    Comment: (NOTE) SARS-CoV-2 target nucleic acids are NOT DETECTED.  The SARS-CoV-2 RNA is  generally detectable in upper respiratory specimens during the acute phase of infection. The lowest concentration of SARS-CoV-2 viral copies this assay can detect is 138 copies/mL. A negative result does not preclude SARS-Cov-2 infection and should not be used as the sole basis for treatment or other patient management decisions. A negative result may occur with  improper specimen collection/handling, submission of specimen other than nasopharyngeal swab, presence of viral mutation(s) within the areas targeted by this assay, and inadequate number of viral copies(<138 copies/mL). A negative result must be combined with clinical observations, patient history, and epidemiological information. The expected result is Negative.  Fact Sheet for Patients:  EntrepreneurPulse.com.au  Fact Sheet for Healthcare Providers:  IncredibleEmployment.be  This test is no t yet approved or cleared by the [place] FDA and  has been authorized for detection and/or diagnosis of SARS-CoV-2 by FDA under an Emergency Use Authorization (EUA). This EUA will remain  in effect (meaning this test can be used) for the duration of the COVID-19  declaration under Section 564(b)(1) of the Act, 21 U.S.C.section 360bbb-3(b)(1), unless the authorization is terminated  or revoked sooner.       Influenza A by PCR NEGATIVE NEGATIVE Final   Influenza B by PCR NEGATIVE NEGATIVE Final    Comment: (NOTE) The Xpert Xpress SARS-CoV-2/FLU/RSV plus assay is intended as an aid in the diagnosis of influenza from Nasopharyngeal swab specimens and should not be used as a sole basis for treatment. Nasal washings and aspirates are unacceptable for Xpert Xpress SARS-CoV-2/FLU/RSV testing.  Fact Sheet for Patients: EntrepreneurPulse.com.au  Fact Sheet for Healthcare Providers: IncredibleEmployment.be  This test is not yet approved or cleared by the [place] FDA and has been authorized for detection and/or diagnosis of SARS-CoV-2 by FDA under an Emergency Use Authorization (EUA). This EUA will remain in effect (meaning this test can be used) for the duration of the COVID-19 declaration under Section 564(b)(1) of the Act, 21 U.S.C. section 360bbb-3(b)(1), unless the authorization is terminated or revoked.  Performed at [facility], [place] [address] [place], [place] [address]   Urine Culture     Status: None   Collection Time: 04/19/21 12:56 PM   Specimen: Urine, Clean Catch  Result Value Ref Range Status   Specimen Description   Final    URINE, CLEAN CATCH Performed at [facility], [place] [address], [place], [place] [address]    Special Requests   Final    NONE Performed at [facility], [place] [address], [place], [place] [address]    Culture   Final    NO GROWTH Performed at [facility], [place] [address], [place], [place] [address]    Report Status 04/20/2021 FINAL  Final  Gastrointestinal Panel by PCR , Stool     Status: None   Collection Time: 04/20/21 10:01 AM   Specimen: STOOL  Result Value Ref Range Status   Campylobacter  species NOT DETECTED NOT DETECTED Final   Plesimonas shigelloides NOT DETECTED NOT DETECTED Final   Salmonella species NOT DETECTED NOT DETECTED Final   Yersinia enterocolitica NOT DETECTED NOT DETECTED Final   Vibrio species NOT DETECTED NOT DETECTED Final   Vibrio cholerae NOT DETECTED NOT DETECTED Final   Enteroaggregative E coli (EAEC) NOT DETECTED NOT DETECTED Final   Enteropathogenic E coli (EPEC) NOT DETECTED NOT DETECTED Final   Enterotoxigenic E coli (ETEC) NOT DETECTED NOT DETECTED Final   Shiga like toxin producing E coli (STEC) NOT DETECTED NOT  DETECTED Final   Shigella/Enteroinvasive E coli (EIEC) NOT DETECTED NOT DETECTED Final   Cryptosporidium NOT DETECTED NOT DETECTED Final   Cyclospora cayetanensis NOT DETECTED NOT DETECTED Final   Entamoeba histolytica NOT DETECTED NOT DETECTED Final   Giardia lamblia NOT DETECTED NOT DETECTED Final   Adenovirus F40/41 NOT DETECTED NOT DETECTED Final   Astrovirus NOT DETECTED NOT DETECTED Final   Norovirus GI/GII NOT DETECTED NOT DETECTED Final   Rotavirus A NOT DETECTED NOT DETECTED Final   Sapovirus (I, II, IV, and V) NOT DETECTED NOT DETECTED Final    Comment: Performed at [facility], [place] [place], [place] [address]  C Difficile Quick Screen w PCR reflex     Status: Abnormal   Collection Time: 04/20/21 10:01 AM   Specimen: STOOL  Result Value Ref Range Status   C Diff antigen POSITIVE (A) NEGATIVE Final   C Diff toxin NEGATIVE NEGATIVE Final   C Diff interpretation Results are indeterminate. See PCR results.  Final    Comment: Performed at [facility], [place] [address] [place], [place] [address]  C. Diff by PCR, Reflexed     Status: Abnormal   Collection Time: 04/20/21 10:01 AM  Result Value Ref Range Status   Toxigenic C. Difficile by PCR POSITIVE (A) NEGATIVE Final    Comment: Positive for toxigenic C. difficile with little to no toxin production. Only treat if clinical presentation  suggests symptomatic illness. Performed at [facility], [place] [address] [place],  [address]           Radiology Studies: DG Chest 1 View  Result Date: 04/26/2021 CLINICAL DATA:  Shortness of breath. EXAM: CHEST  1 VIEW COMPARISON:  Chest x-ray 03/24/2021 FINDINGS: Moderate size left pleural effusion is unchanged. There is a new small right pleural effusion. There are minimal bibasilar opacities. There is no pneumothorax. Cardiomediastinal silhouette is unchanged, the heart is mildly enlarged. Surgical clips are seen along the left chest wall. No acute fractures are identified. IMPRESSION: 1. New small right pleural effusion. 2. Stable moderate left pleural effusion. 3. Minimal bibasilar atelectasis/airspace disease. Electronically Signed   By: Ronney Asters M.D.   On: 04/26/2021 15:45   US RENAL  Result Date: 04/26/2021 CLINICAL DATA:  Elevated BUN and creatinine.  Short of breath. EXAM: RENAL / URINARY TRACT ULTRASOUND COMPLETE COMPARISON:  CT, 04/19/2021. FINDINGS: Right Kidney: Renal measurements: 11 x 1 x 4.2 x 5.3 cm = volume: 128.7 mL. Echogenicity within normal limits. No mass or hydronephrosis visualized. Left Kidney: Renal measurements: 11.0 x 4.5 x 4.8 cm = volume: 124 mL. Echogenicity within normal limits. No mass or hydronephrosis visualized. Bladder: Unremarkable. Hypo to anechoic areas adjacent to the bladder matter nonspecific, possibly small amounts of peritoneal fluid versus fluid-filled bowel. Other: None. IMPRESSION: 1. Normal sonographic appearance of the kidneys. Electronically Signed   By: Lajean Manes M.D.   On: 04/26/2021 16:35   ECHOCARDIOGRAM COMPLETE  Result Date: 04/27/2021    ECHOCARDIOGRAM REPORT   Patient Name:   Sara Barnes Date of Exam: 04/27/2021 Medical Rec #:  [id number]      Height:       66.0 in Accession #:    [id number]     Weight:       159.0 lb Date of Birth:  1940-10-02       BSA:          1.814 m Patient Age:    19 years  BP:            121/64 mmHg Patient Gender: F              HR:           87 bpm. Exam Location:  Inpatient Procedure: 2D Echo, Cardiac Doppler and Color Doppler Indications:    Abnormal ECG R94.31  History:        Patient has prior history of Echocardiogram examinations, most                 recent 06/22/2016. Risk Factors:Dyslipidemia. GERD. Past history                 of breast cancer.  Sonographer:    Johny Chess RDCS Referring Phys: [id number] [place]  Sonographer Comments: Image acquisition challenging due to breast implants. Exam ended per patients request. IMPRESSIONS  1. Poor image quality due to breast implants and patient stopped exam before done.  2. Left ventricular ejection fraction, by estimation, is 60 to 65%. The left ventricle has normal function. The left ventricle has no regional wall motion abnormalities. Left ventricular diastolic parameters were normal.  3. Right ventricular systolic function is normal. The right ventricular size is normal. There is normal pulmonary artery systolic pressure.  4. The mitral valve is normal in structure. No evidence of mitral valve regurgitation. No evidence of mitral stenosis.  5. The aortic valve was not well visualized. Aortic valve regurgitation is not visualized. No aortic stenosis is present.  6. The inferior vena cava is normal in size with greater than 50% respiratory variability, suggesting right atrial pressure of 3 mmHg. FINDINGS  Left Ventricle: Left ventricular ejection fraction, by estimation, is 60 to 65%. The left ventricle has normal function. The left ventricle has no regional wall motion abnormalities. The left ventricular internal cavity size was normal in size. There is  no left ventricular hypertrophy. Left ventricular diastolic parameters were normal. Right Ventricle: The right ventricular size is normal. No increase in right ventricular wall thickness. Right ventricular systolic function is normal. There is normal pulmonary artery  systolic pressure. The tricuspid regurgitant velocity is 2.28 m/s, and  with an assumed right atrial pressure of 8 mmHg, the estimated right ventricular systolic pressure is 16.0 mmHg. Left Atrium: Left atrial size was normal in size. Right Atrium: Right atrial size was normal in size. Pericardium: There is no evidence of pericardial effusion. Mitral Valve: The mitral valve is normal in structure. No evidence of mitral valve regurgitation. No evidence of mitral valve stenosis. Tricuspid Valve: The tricuspid valve is normal in structure. Tricuspid valve regurgitation is not demonstrated. No evidence of tricuspid stenosis. Aortic Valve: The aortic valve was not well visualized. Aortic valve regurgitation is not visualized. No aortic stenosis is present. Pulmonic Valve: The pulmonic valve was normal in structure. Pulmonic valve regurgitation is not visualized. No evidence of pulmonic stenosis. Aorta: The aortic root is normal in size and structure. Venous: The inferior vena cava is normal in size with greater than 50% respiratory variability, suggesting right atrial pressure of 3 mmHg. IAS/Shunts: The interatrial septum was not assessed. Additional Comments: Poor image quality due to breast implants and patient stopped exam before done.  LEFT VENTRICLE PLAX 2D LVIDd:         3.70 cm   Diastology LVIDs:         2.30 cm   LV e' medial:    8.03 cm/s LV PW:  1.00 cm   LV E/e' medial:  11.8 LV IVS:        1.10 cm   LV e' lateral:   7.62 cm/s LVOT diam:     1.90 cm   LV E/e' lateral: 12.4 LV SV:         57 LV SV Index:   32 LVOT Area:     2.84 cm  RIGHT VENTRICLE RV S prime:     16.30 cm/s TAPSE (M-mode): 1.6 cm LEFT ATRIUM             Index        RIGHT ATRIUM           Index LA diam:        3.50 cm 1.93 cm/m   RA Area:     12.90 cm LA Vol (A2C):   32.9 ml 18.14 ml/m  RA Volume:   27.80 ml  15.33 ml/m LA Vol (A4C):   29.7 ml 16.37 ml/m LA Biplane Vol: 31.6 ml 17.42 ml/m  AORTIC VALVE LVOT Vmax:   125.00 cm/s  LVOT Vmean:  66.600 cm/s LVOT VTI:    0.202 m  AORTA Ao Root diam: 2.90 cm Ao Asc diam:  2.80 cm MITRAL VALVE               TRICUSPID VALVE MV Area (PHT): 3.99 cm    TR Peak grad:   20.8 mmHg MV Decel Time: 190 msec    TR Vmax:        228.00 cm/s MV E velocity: 94.70 cm/s MV A velocity: 90.00 cm/s  SHUNTS MV E/A ratio:  1.05        Systemic VTI:  0.20 m                            Systemic Diam: 1.90 cm Jenkins Rouge MD Electronically signed by Jenkins Rouge MD Signature Date/Time: 04/27/2021/12:08:59 PM    Final         Scheduled Meds:  (feeding supplement) PROSource Plus  30 mL Oral BID BM   enoxaparin (LOVENOX) injection  30 mg Subcutaneous Daily   feeding supplement  1 Container Oral TID BM   FLUoxetine  20 mg Oral Daily   metoCLOPramide (REGLAN) injection  5 mg Intravenous Q8H   metoprolol tartrate  12.5 mg Oral BID   multivitamin with minerals  1 tablet Oral Daily   predniSONE  5 mg Oral Q breakfast   vancomycin  125 mg Oral Q6H   Continuous Infusions:  albumin human 25 g (04/27/21 1129)   famotidine (PEPCID) IV 20 mg (04/27/21 1255)   promethazine (PHENERGAN) injection (IM or IVPB)       LOS: 8 days    Time spent: 45 minutes  Georgette Shell, MD  04/27/2021, 1:49 PM

## 2021-04-28 DIAGNOSIS — C220 Liver cell carcinoma: Secondary | ICD-10-CM

## 2021-04-28 DIAGNOSIS — R112 Nausea with vomiting, unspecified: Secondary | ICD-10-CM | POA: Diagnosis not present

## 2021-04-28 HISTORY — DX: Liver cell carcinoma: C22.0

## 2021-04-28 LAB — CBC
HCT: 25.7 % — ABNORMAL LOW (ref 36.0–46.0)
HCT: 26.8 % — ABNORMAL LOW (ref 36.0–46.0)
HCT: 27.1 % — ABNORMAL LOW (ref 36.0–46.0)
Hemoglobin: 8.7 g/dL — ABNORMAL LOW (ref 12.0–15.0)
Hemoglobin: 8.8 g/dL — ABNORMAL LOW (ref 12.0–15.0)
Hemoglobin: 8.9 g/dL — ABNORMAL LOW (ref 12.0–15.0)
MCH: 32.5 pg (ref 26.0–34.0)
MCH: 31.4 pg (ref 26.0–34.0)
MCH: 31.7 pg (ref 26.0–34.0)
MCHC: 33.9 g/dL (ref 30.0–36.0)
MCHC: 32.8 g/dL (ref 30.0–36.0)
MCHC: 32.8 g/dL (ref 30.0–36.0)
MCV: 95.9 fL (ref 80.0–100.0)
MCV: 95.7 fL (ref 80.0–100.0)
MCV: 96.4 fL (ref 80.0–100.0)
Platelets: 198 10*3/uL (ref 150–400)
Platelets: 212 10*3/uL (ref 150–400)
Platelets: 226 10*3/uL (ref 150–400)
RBC: 2.68 MIL/uL — ABNORMAL LOW (ref 3.87–5.11)
RBC: 2.8 MIL/uL — ABNORMAL LOW (ref 3.87–5.11)
RBC: 2.81 MIL/uL — ABNORMAL LOW (ref 3.87–5.11)
RDW: 12 % (ref 11.5–15.5)
RDW: 11.9 % (ref 11.5–15.5)
RDW: 11.9 % (ref 11.5–15.5)
WBC: 8.8 10*3/uL (ref 4.0–10.5)
WBC: 9.5 10*3/uL (ref 4.0–10.5)
WBC: 9.6 10*3/uL (ref 4.0–10.5)
nRBC: 0 % (ref 0.0–0.2)
nRBC: 0 % (ref 0.0–0.2)
nRBC: 0 % (ref 0.0–0.2)

## 2021-04-28 LAB — BASIC METABOLIC PANEL
Anion gap: 7 (ref 5–15)
BUN: 26 mg/dL — ABNORMAL HIGH (ref 8–23)
CO2: 24 mmol/L (ref 22–32)
Calcium: 8.9 mg/dL (ref 8.9–10.3)
Chloride: 99 mmol/L (ref 98–111)
Creatinine, Ser: 1.48 mg/dL — ABNORMAL HIGH (ref 0.44–1.00)
GFR, Estimated: 36 mL/min — ABNORMAL LOW (ref >60)
Glucose, Bld: 94 mg/dL (ref 70–99)
Potassium: 2.9 mmol/L — ABNORMAL LOW (ref 3.5–5.1)
Sodium: 130 mmol/L — ABNORMAL LOW (ref 135–145)

## 2021-04-28 LAB — HEMOGLOBIN AND HEMATOCRIT, BLOOD
HCT: 30.9 % — ABNORMAL LOW (ref 36.0–46.0)
Hemoglobin: 10.1 g/dL — ABNORMAL LOW (ref 12.0–15.0)

## 2021-04-28 LAB — PREPARE RBC (CROSSMATCH)

## 2021-04-28 LAB — MAGNESIUM: Magnesium: 2.1 mg/dL (ref 1.7–2.4)

## 2021-04-28 MED ORDER — POTASSIUM CHLORIDE CRYS ER 20 MEQ PO TBCR
40.0000 meq | EXTENDED_RELEASE_TABLET | ORAL | Status: AC
  Administered 2021-04-28 (×2): 40 meq via ORAL
  Filled 2021-04-28 (×2): qty 2

## 2021-04-28 MED ORDER — METRONIDAZOLE 500 MG PO TABS
500.0000 mg | ORAL_TABLET | Freq: Three times a day (TID) | ORAL | Status: DC
  Administered 2021-04-28 – 2021-04-29 (×3): 500 mg via ORAL
  Filled 2021-04-28 (×3): qty 1

## 2021-04-28 MED ORDER — SODIUM CHLORIDE 0.9% IV SOLUTION: Freq: Once | INTRAVENOUS | Status: AC

## 2021-04-28 NOTE — Consult Note (Signed)
Reason for Consult:abd pain Referring Physician: [name] is an 81 y.o. female.  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  This was apparently excised completely.  On work-up she was found to have a mass in the left lobe of her liver.  On December 28 she underwent a percutaneous needle biopsy of this liver mass.  As soon as the biopsy was performed she developed upper abdominal pain.  This was associated with nausea and vomiting.  She denies any fevers or chills.  She did have some lightheadedness but did not pass out.  Her hemoglobin prior to the biopsy was 14.  Her hemoglobin this morning was 9.  She underwent a CT scan yesterday that did show a very small amount of hemoperitoneum but not nearly enough to account for her hemoglobin drop.  She also appears to have a hematoma in the left lobe of the liver itself.  Past Medical History:  Diagnosis Date   Allergy    Anemia    Anxiety    Arthritis    Basal cell carcinoma    Blood transfusion without reported diagnosis    Bowel perforation ([place])    Cancer of lower-inner quadrant of female breast ([place]) 11/30/2014   left   Cancer of lower-inner quadrant of left female breast ([place]) 04/30/2438   Complication of anesthesia    anxiety post-op after ear surgeries   Crohn's disease of large intestine with abscess ([place])    Dental crowns present    recent root canal 11/2014   Depression    Family history of adverse reaction to anesthesia    pt's sister has hx. of post-op N/V   GERD (gastroesophageal reflux disease)    Hyperlipidemia    Indigestion    Perianal abscess    Personal history of radiation therapy 2016   PONV (postoperative nausea and vomiting)    Runny nose 12/07/2014   clear drainage, per pt.   Seasonal allergies    TMJ (temporomandibular joint syndrome)    Ulcerative colitis    Vitamin B12 deficiency     Past Surgical  History:  Procedure Laterality Date   ABDOMINAL HYSTERECTOMY     partial   APPENDECTOMY     AUGMENTATION MAMMAPLASTY Bilateral    BASAL CELL CARCINOMA EXCISION Left    nose   BREAST BIOPSY Left 2019   benign   BREAST ENHANCEMENT SURGERY     BREAST LUMPECTOMY Left 2016   BREAST LUMPECTOMY WITH RADIOACTIVE SEED AND SENTINEL LYMPH NODE BIOPSY Left 12/13/2014   Procedure: LEFT BREAST LUMPECTOMY WITH RADIOACTIVE SEED AND SENTINEL LYMPH NODE BIOPSY;  Surgeon: [name], MD;  Location: [facility];  Service: General;  Laterality: Left;   COLONOSCOPY WITH PROPOFOL  05/28/2011; 06/28/2014   EXPLORATORY LAPAROTOMY  04/08/2018   with end ileostomy takedown and creation of loop ileostomy-- [facility] [place] N/A 01/08/2016   Procedure: Beryle Quant;  Surgeon: [name], MD;  Location: [facility];  Service: Gastroenterology;  Laterality: N/A;  needs MAC due to pain and anxiety   ILEOSTOMY N/A 05/25/2017   Procedure: DILATION OF ILEOSTOMY;  Surgeon: Rolm Bookbinder, MD;  Location: [facility];  Service: General;  Laterality: N/A;   ILEOSTOMY  02/11/2016   END ileostomy.  Dr Hassell Done   ILEOSTOMY CLOSURE N/A 06/02/2017   Procedure: RESECTION OF ILEAL STRICTURE;  ILEOSTOMY REVISION ;  Surgeon: Michael Boston, MD;  Location: [facility];  Service: General;  Laterality: N/A;   ILEOSTOMY DILATION  05/25/2017   DILITATION OF ILEAL STRICTURE   ileostomy stricture resection  06/02/2017   with revision   INCISION AND DRAINAGE ABSCESS  2018   INCISION AND DRAINAGE PERIRECTAL ABSCESS N/A 09/28/2013   Procedure: IRRIGATION AND DEBRIDEMENT PERIANAL ABSCESS, proctoscopy;  Surgeon: Shann Medal, MD;  Location: [facility];  Service: General;  Laterality: N/A;   INCISION AND DRAINAGE PERIRECTAL ABSCESS N/A 01/22/2016   Procedure: IRRIGATION AND DEBRIDEMENT PERIRECTAL ABSCESS;  Surgeon: Armandina Gemma, MD;  [facility];  Service: General;   Laterality: N/A;   INNER EAR SURGERY Bilateral    LAPAROSCOPIC DIVERTED COLOSTOMY N/A 02/11/2016   Procedure: LAPAROSCOPIC DIVERTED ILEOSTOMY;  Surgeon: Johnathan Hausen, MD;  Location: [facility];  Service: General;  Laterality: N/A;   LAPAROSCOPY N/A 03/23/2017   Procedure: LAPAROSCOPY DIAGNOSTIC, EXPLORATORY LAPAROTOMY,  BOWEL RESECTION;  Surgeon: Jackolyn Confer, MD;  Location[facility];  Service: General;  Laterality: N/A;    Family History  Problem Relation Age of Onset   Prostate cancer Brother 50   Kidney disease Brother    Diabetes Brother    Brain cancer Father    Rheum arthritis Mother    Anesthesia problems Sister        post-op N/V   Emphysema Maternal Grandfather    Asthma Sister    Rheum arthritis Brother    Esophageal cancer Neg Hx    Breast cancer Neg Hx    Rectal cancer Neg Hx    Stomach cancer Neg Hx    Colon cancer Neg Hx     Social History:  reports that she has never smoked. She has never used smokeless tobacco. She reports current alcohol use of about 7.0 standard drinks per week. She reports that she does not use drugs.  Allergies:  Allergies  Allergen Reactions   Nsaids Other (See Comments)    Crohn's disease = NO NSAIDs   Codeine Nausea And Vomiting   Mesalamine Nausea And Vomiting   Oxycodone Itching and Rash   Lactose Intolerance (Gi) Diarrhea   Norco [Hydrocodone-Acetaminophen] Nausea Only   Other Itching    PERFUMED LOTIONS  Cant have MRI due to ear implant    Sulfa Antibiotics Rash    Medications: I have reviewed the patient's current medications.  Results for orders placed or performed during the hospital encounter of 04/19/21 (from the past 48 hour(s))  Urinalysis, Routine w reflex microscopic Urine, Clean Catch     Status: Abnormal   Collection Time: 04/26/21 12:32 PM  Result Value Ref Range   Color, Urine YELLOW YELLOW   APPearance CLEAR CLEAR   Specific Gravity, Urine 1.014 1.005 - 1.030   pH 5.0 5.0 - 8.0   Glucose, UA NEGATIVE  NEGATIVE mg/dL   Hgb urine dipstick MODERATE (A) NEGATIVE   Bilirubin Urine NEGATIVE NEGATIVE   Ketones, ur NEGATIVE NEGATIVE mg/dL   Protein, ur NEGATIVE NEGATIVE mg/dL   Nitrite NEGATIVE NEGATIVE   Leukocytes,Ua NEGATIVE NEGATIVE   RBC / HPF 11-20 0 - 5 RBC/hpf   WBC, UA 0-5 0 - 5 WBC/hpf   Bacteria, UA FEW (A) NONE SEEN   Squamous Epithelial / LPF 0-5 0 - 5   Mucus PRESENT    Hyaline Casts, UA PRESENT     Comment: Performed at [facility], [place] [address], [place], [place] [address]  Basic metabolic panel  Status: Abnormal   Collection Time: 04/26/21  4:47 PM  Result Value Ref Range   Sodium 126 (L) 135 - 145 mmol/L   Potassium 3.5 3.5 - 5.1 mmol/L   Chloride 98 98 - 111 mmol/L   CO2 20 (L) 22 - 32 mmol/L   Glucose, Bld 121 (H) 70 - 99 mg/dL    Comment: Glucose reference range applies only to samples taken after fasting for at least 8 hours.   BUN 31 (H) 8 - 23 mg/dL   Creatinine, Ser 2.15 (H) 0.44 - 1.00 mg/dL   Calcium 8.2 (L) 8.9 - 10.3 mg/dL   GFR, Estimated 23 (L) >60 mL/min    Comment: (NOTE) Calculated using the CKD-EPI Creatinine Equation (2021)    Anion gap 8 5 - 15    Comment: Performed at [facility], [place] [address] [place], [place] [address]  Troponin I (High Sensitivity)     Status: Abnormal   Collection Time: 04/26/21  5:50 PM  Result Value Ref Range   Troponin I (High Sensitivity) 27 (H) <18 ng/L    Comment: (NOTE) Elevated high sensitivity troponin I (hsTnI) values and significant  changes across serial measurements may suggest ACS but many other  chronic and acute conditions are known to elevate hsTnI results.  Refer to the "Links" section for chest pain algorithms and additional  guidance. Performed at [facility], [place] [address] [place], [place] [address]   TSH     Status: None   Collection Time: 04/26/21  6:56 PM  Result Value Ref Range   TSH 1.286 0.350 - 4.500 uIU/mL    Comment:  Performed by a 3rd Generation assay with a functional sensitivity of <=0.01 uIU/mL. Performed at [facility], [place] [address] [place], [place] [address]   Basic metabolic panel     Status: Abnormal   Collection Time: 04/27/21  5:30 AM  Result Value Ref Range   Sodium 133 (L) 135 - 145 mmol/L    Comment: DELTA CHECK NOTED   Potassium 3.5 3.5 - 5.1 mmol/L   Chloride 98 98 - 111 mmol/L   CO2 24 22 - 32 mmol/L   Glucose, Bld 95 70 - 99 mg/dL    Comment: Glucose reference range applies only to samples taken after fasting for at least 8 hours.   BUN 29 (H) 8 - 23 mg/dL   Creatinine, Ser 1.77 (H) 0.44 - 1.00 mg/dL   Calcium 9.0 8.9 - 10.3 mg/dL   GFR, Estimated 29 (L) >60 mL/min    Comment: (NOTE) Calculated using the CKD-EPI Creatinine Equation (2021)    Anion gap 11 5 - 15    Comment: Performed at [facility], [place] [address] [place], [place] [address]  Magnesium     Status: None   Collection Time: 04/28/21  5:17 AM  Result Value Ref Range   Magnesium 2.1 1.7 - 2.4 mg/dL    Comment: Performed at [facility], [place] [address] [place], [place] [address]  Basic metabolic panel     Status: Abnormal   Collection Time: 04/28/21  5:21 AM  Result Value Ref Range   Sodium 130 (L) 135 - 145 mmol/L   Potassium 2.9 (L) 3.5 - 5.1 mmol/L    Comment: DELTA CHECK NOTED   Chloride 99 98 - 111 mmol/L   CO2 24 22 - 32 mmol/L   Glucose, Bld 94 70 - 99 mg/dL    Comment: Glucose reference  range applies only to samples taken after fasting for at least 8 hours.   BUN 26 (H) 8 - 23 mg/dL   Creatinine, Ser 1.48 (H) 0.44 - 1.00 mg/dL   Calcium 8.9 8.9 - 10.3 mg/dL   GFR, Estimated 36 (L) >60 mL/min    Comment: (NOTE) Calculated using the CKD-EPI Creatinine Equation (2021)    Anion gap 7 5 - 15    Comment: Performed at [facility], [place] [address] [place], [place] [address]  CBC     Status: Abnormal   Collection Time:  04/28/21  7:21 AM  Result Value Ref Range   WBC 8.8 4.0 - 10.5 K/uL   RBC 2.68 (L) 3.87 - 5.11 MIL/uL   Hemoglobin 8.7 (L) 12.0 - 15.0 g/dL   HCT 25.7 (L) 36.0 - 46.0 %   MCV 95.9 80.0 - 100.0 fL   MCH 32.5 26.0 - 34.0 pg   MCHC 33.9 30.0 - 36.0 g/dL   RDW 12.0 11.5 - 15.5 %   Platelets 198 150 - 400 K/uL   nRBC 0.0 0.0 - 0.2 %    Comment: Performed at [facility], [place] [address] [place], [place] [address]    CT ABDOMEN PELVIS WO CONTRAST  Result Date: 04/27/2021 CLINICAL DATA:  Postop.  Abdominal pain.  Nausea vomiting. EXAM: CT ABDOMEN AND PELVIS WITHOUT CONTRAST TECHNIQUE: Multidetector CT imaging of the abdomen and pelvis was performed following the standard protocol without IV contrast. COMPARISON:  04/19/2021. FINDINGS: Lower chest: Moderate left and small to moderate right pleural effusions. Left greater than right lower lobe opacities consistent with atelectasis. Scattered linear and reticular opacities elsewhere at the lung bases, also likely atelectasis. Pleural effusions have increased in size from the prior CT. Hepatobiliary: Lateral segment of the left lobe appears larger than on the recent prior exam, particularly along its inferior margin. There is a vague area of relative hypoattenuation across the inferior aspect, segment 3. Remainder of the liver is unremarkable. Normal gallbladder. No bile duct dilation. Pancreas: Unremarkable. No pancreatic ductal dilatation or surrounding inflammatory changes. Spleen: Normal in size without focal abnormality. Adrenals/Urinary Tract: Adrenal glands are unremarkable. Kidneys are normal, without renal calculi, focal lesion, or hydronephrosis. Bladder is unremarkable. Stomach/Bowel: Minimal hiatal hernia. Stomach mostly decompressed. There is haziness increased attenuation along the wall of the gastric antrum. Small bowel and colon are normal in caliber. No wall thickening or inflammation. Vascular/Lymphatic: Aortic  atherosclerosis. No aneurysm. No enlarged lymph nodes. Reproductive: Status post hysterectomy. No adnexal masses. Other: Small amount of hemoperitoneum primarily collecting in the pelvis.a additional small areas of hyperattenuation lie underneath the left hemidiaphragm suspected to be additional hemorrhage, abutting the lateral segment the left liver lobe. Musculoskeletal: No fracture or acute finding. IMPRESSION: 1. Suspected hemorrhage from the lateral segment of the left liver lobe. Lateral segment now larger than it was on the previous CT with an area of hypoattenuation in segment 3, which may reflect a contusion/laceration. The round lesion noted in segment 2 on the prior CT is not defined. There is apparent hemorrhage adjacent to the lateral segment, underneath the left hemidiaphragm and adjacent to the stomach. Definitive hemoperitoneum, small in amount, is seen in the pelvis. 2. No other acute abnormality within the abdomen or pelvis. 3. Moderate left and small right pleural effusion with associated lung base dependent opacity consistent with atelectasis. Pleural effusions have increased in size from prior CT. Electronically Signed   By: Lajean Manes M.D.   On:  04/27/2021 15:56   DG Chest 1 View  Result Date: 04/26/2021 CLINICAL DATA:  Shortness of breath. EXAM: CHEST  1 VIEW COMPARISON:  Chest x-ray 03/24/2021 FINDINGS: Moderate size left pleural effusion is unchanged. There is a new small right pleural effusion. There are minimal bibasilar opacities. There is no pneumothorax. Cardiomediastinal silhouette is unchanged, the heart is mildly enlarged. Surgical clips are seen along the left chest wall. No acute fractures are identified. IMPRESSION: 1. New small right pleural effusion. 2. Stable moderate left pleural effusion. 3. Minimal bibasilar atelectasis/airspace disease. Electronically Signed   By: Ronney Asters M.D.   On: 04/26/2021 15:45   US RENAL  Result Date: 04/26/2021 CLINICAL DATA:   Elevated BUN and creatinine.  Short of breath. EXAM: RENAL / URINARY TRACT ULTRASOUND COMPLETE COMPARISON:  CT, 04/19/2021. FINDINGS: Right Kidney: Renal measurements: 11 x 1 x 4.2 x 5.3 cm = volume: 128.7 mL. Echogenicity within normal limits. No mass or hydronephrosis visualized. Left Kidney: Renal measurements: 11.0 x 4.5 x 4.8 cm = volume: 124 mL. Echogenicity within normal limits. No mass or hydronephrosis visualized. Bladder: Unremarkable. Hypo to anechoic areas adjacent to the bladder matter nonspecific, possibly small amounts of peritoneal fluid versus fluid-filled bowel. Other: None. IMPRESSION: 1. Normal sonographic appearance of the kidneys. Electronically Signed   By: Lajean Manes M.D.   On: 04/26/2021 16:35   ECHOCARDIOGRAM COMPLETE  Result Date: 04/27/2021    ECHOCARDIOGRAM REPORT   Patient Name:   Sara Barnes Date of Exam: 04/27/2021 Medical Rec #:  [id number]      Height:       66.0 in Accession #:    [id number]     Weight:       159.0 lb Date of Birth:  [birth year]       BSA:          1.814 m Patient Age:    76 years       BP:           121/64 mmHg Patient Gender: F              HR:           87 bpm. Exam Location:  Inpatient Procedure: 2D Echo, Cardiac Doppler and Color Doppler Indications:    Abnormal ECG R94.31  History:        Patient has prior history of Echocardiogram examinations, most                 recent 06/22/2016. Risk Factors:Dyslipidemia. GERD. Past history                 of breast cancer.  Sonographer:    Johny Chess RDCS Referring Phys: [id number] [place]  Sonographer Comments: Image acquisition challenging due to breast implants. Exam ended per patients request. IMPRESSIONS  1. Poor image quality due to breast implants and patient stopped exam before done.  2. Left ventricular ejection fraction, by estimation, is 60 to 65%. The left ventricle has normal function. The left ventricle has no regional wall motion abnormalities. Left ventricular diastolic parameters  were normal.  3. Right ventricular systolic function is normal. The right ventricular size is normal. There is normal pulmonary artery systolic pressure.  4. The mitral valve is normal in structure. No evidence of mitral valve regurgitation. No evidence of mitral stenosis.  5. The aortic valve was not well visualized. Aortic valve regurgitation is not visualized. No aortic stenosis is present.  6. The inferior vena cava is  normal in size with greater than 50% respiratory variability, suggesting right atrial pressure of 3 mmHg. FINDINGS  Left Ventricle: Left ventricular ejection fraction, by estimation, is 60 to 65%. The left ventricle has normal function. The left ventricle has no regional wall motion abnormalities. The left ventricular internal cavity size was normal in size. There is  no left ventricular hypertrophy. Left ventricular diastolic parameters were normal. Right Ventricle: The right ventricular size is normal. No increase in right ventricular wall thickness. Right ventricular systolic function is normal. There is normal pulmonary artery systolic pressure. The tricuspid regurgitant velocity is 2.28 m/s, and  with an assumed right atrial pressure of 8 mmHg, the estimated right ventricular systolic pressure is 62.2 mmHg. Left Atrium: Left atrial size was normal in size. Right Atrium: Right atrial size was normal in size. Pericardium: There is no evidence of pericardial effusion. Mitral Valve: The mitral valve is normal in structure. No evidence of mitral valve regurgitation. No evidence of mitral valve stenosis. Tricuspid Valve: The tricuspid valve is normal in structure. Tricuspid valve regurgitation is not demonstrated. No evidence of tricuspid stenosis. Aortic Valve: The aortic valve was not well visualized. Aortic valve regurgitation is not visualized. No aortic stenosis is present. Pulmonic Valve: The pulmonic valve was normal in structure. Pulmonic valve regurgitation is not visualized. No evidence  of pulmonic stenosis. Aorta: The aortic root is normal in size and structure. Venous: The inferior vena cava is normal in size with greater than 50% respiratory variability, suggesting right atrial pressure of 3 mmHg. IAS/Shunts: The interatrial septum was not assessed. Additional Comments: Poor image quality due to breast implants and patient stopped exam before done.  LEFT VENTRICLE PLAX 2D LVIDd:         3.70 cm   Diastology LVIDs:         2.30 cm   LV e' medial:    8.03 cm/s LV PW:         1.00 cm   LV E/e' medial:  11.8 LV IVS:        1.10 cm   LV e' lateral:   7.62 cm/s LVOT diam:     1.90 cm   LV E/e' lateral: 12.4 LV SV:         57 LV SV Index:   32 LVOT Area:     2.84 cm  RIGHT VENTRICLE RV S prime:     16.30 cm/s TAPSE (M-mode): 1.6 cm LEFT ATRIUM             Index        RIGHT ATRIUM           Index LA diam:        3.50 cm 1.93 cm/m   RA Area:     12.90 cm LA Vol (A2C):   32.9 ml 18.14 ml/m  RA Volume:   27.80 ml  15.33 ml/m LA Vol (A4C):   29.7 ml 16.37 ml/m LA Biplane Vol: 31.6 ml 17.42 ml/m  AORTIC VALVE LVOT Vmax:   125.00 cm/s LVOT Vmean:  66.600 cm/s LVOT VTI:    0.202 m  AORTA Ao Root diam: 2.90 cm Ao Asc diam:  2.80 cm MITRAL VALVE               TRICUSPID VALVE MV Area (PHT): 3.99 cm    TR Peak grad:   20.8 mmHg MV Decel Time: 190 msec    TR Vmax:        228.00 cm/s MV E  velocity: 94.70 cm/s MV A velocity: 90.00 cm/s  SHUNTS MV E/A ratio:  1.05        Systemic VTI:  0.20 m                            Systemic Diam: 1.90 cm Jenkins Rouge MD Electronically signed by Jenkins Rouge MD Signature Date/Time: 04/27/2021/12:08:59 PM    Final     Review of Systems  Constitutional: Negative.   HENT: Negative.    Eyes: Negative.   Respiratory: Negative.    Cardiovascular: Negative.   Gastrointestinal:  Positive for abdominal pain.  Endocrine: Negative.   Genitourinary: Negative.   Musculoskeletal: Negative.   Skin: Negative.   Allergic/Immunologic: Negative.   Neurological:  Positive for  dizziness.  Hematological: Negative.   Psychiatric/Behavioral: Negative.    Blood pressure 116/64, pulse 71, temperature 98.2 F (36.8 C), temperature source Oral, resp. rate 19, height _0  (1.676 m), weight 72.1 kg, SpO2 98 %. Physical Exam Constitutional:      General: She is not in acute distress.    Appearance: Normal appearance.  HENT:     Head: Normocephalic and atraumatic.     Right Ear: External ear normal.     Left Ear: External ear normal.     Nose: Nose normal.     Mouth/Throat:     Mouth: Mucous membranes are moist.     Pharynx: Oropharynx is clear.  Eyes:     General: No scleral icterus.    Extraocular Movements: Extraocular movements intact.     Conjunctiva/sclera: Conjunctivae normal.     Pupils: Pupils are equal, round, and reactive to light.  Cardiovascular:     Rate and Rhythm: Normal rate and regular rhythm.     Pulses: Normal pulses.     Heart sounds: Normal heart sounds.  Pulmonary:     Effort: Pulmonary effort is normal. No respiratory distress.     Breath sounds: Normal breath sounds.  Abdominal:     General: Abdomen is flat.     Palpations: Abdomen is soft.     Comments: There is mild to moderate upper abd tenderness  Musculoskeletal:        General: No swelling or tenderness. Normal range of motion.     Cervical back: Normal range of motion and neck supple. No tenderness.  Skin:    General: Skin is warm and dry.     Coloration: Skin is not jaundiced.  Neurological:     General: No focal deficit present.     Mental Status: She is alert and oriented to person, place, and time.  Psychiatric:        Mood and Affect: Mood normal.        Behavior: Behavior normal.        Thought Content: Thought content normal.    Assessment/Plan: The patient appears to have had some element of bleeding related to her liver biopsy 4 days ago.  Her hemoglobin today is almost 9.  It is difficult to determine at what point her hemoglobin drop or was at a slow  decline.  Looking at her CT scan I suspect that her pain is coming from the hematoma in the liver and this may account for her hemoglobin drop as well.  She actually has minimal hemoperitoneum.  At this point we will need to check serial hemoglobins to document stability so that we know she has no ongoing bleeding.  If her  pain is from the hematoma in the liver this will likely take a couple months at least to slowly resolve.  Once we know she does not appear to have ongoing bleeding then we can more safely mobilize her and advance her diet to get her ready to go home.  We will follow her closely with you.  Sara Barnes 04/28/2021, 10:55 AM

## 2021-04-28 NOTE — Progress Notes (Signed)
Referring Physician(s): Dr. Marlou Starks  Supervising Physician: Markus Daft  Patient Status:  [facility] - In-pt  Chief Complaint: Liver lesion s/p biopsy 04/24/21 with post-procedure hematoma and anemia   Subjective: Patient awake in bed, her daughter is at the bedside. Patient endorses nausea, diarrhea, weakness and abdominal pain.   Allergies: Nsaids, Codeine, Mesalamine, Oxycodone, Lactose intolerance (gi), Norco [hydrocodone-acetaminophen], Other, and Sulfa antibiotics  Medications: Prior to Admission medications   Medication Sig Start Date End Date Taking? Authorizing Provider  acetaminophen (TYLENOL) 500 MG tablet You can take 2 tablets every 6 hours as needed for pain.  Use this as your primary pain control.  Use Tramadol as a last resort.  You can buy this over the counter at any drug store.    DO NOT TAKE MORE THAN 4000 MG OF TYLENOL PER DAY.  IT CAN HARM YOUR LIVER. 03/31/17  Yes Earnstine Regal, PA-C  cyanocobalamin (,VITAMIN B-12,) 1000 MCG/ML injection As directed Patient taking differently: 1,000 mcg every 30 (thirty) days. First Sunday of each month 01/16/21  Yes Pyrtle, Lajuan Lines, MD  diphenoxylate-atropine (LOMOTIL) 2.5-0.025 MG tablet Take 1-2 tablets by mouth 3-4 times daily AS NEEDED for severe diarrhea 01/16/21  Yes Pyrtle, Lajuan Lines, MD  famotidine (PEPCID) 20 MG tablet Take 1 tablet (20 mg total) by mouth 2 (two) times daily. Patient taking differently: Take 20 mg by mouth daily as needed for indigestion. 01/16/21  Yes Pyrtle, Lajuan Lines, MD  FLUoxetine (PROZAC) 20 MG capsule Take 1 capsule (20 mg total) by mouth daily. 01/16/21  Yes Pyrtle, Lajuan Lines, MD  fluticasone (FLONASE) 50 MCG/ACT nasal spray Place 2 sprays into both nostrils daily. Patient taking differently: Place 2 sprays into both nostrils daily as needed for rhinitis. 11/12/20  Yes Roma Schanz R, DO  gabapentin (NEURONTIN) 100 MG capsule Take 3 capsules (300 mg total) by mouth 3 (three) times daily. Patient taking  differently: Take 300 mg by mouth at bedtime as needed (for neuropathy). 11/15/20  Yes Marcial Pacas, MD  loperamide (IMODIUM) 2 MG capsule Take 1 capsule (2 mg total) by mouth as needed for diarrhea or loose stools. 11/02/19  Yes Pyrtle, Lajuan Lines, MD  ondansetron (ZOFRAN-ODT) 4 MG disintegrating tablet DISSOLVE ONE TABLET BY MOUTH EVERY 6 HOURS AS NEEDED FOR NAUSEA AND VOMITING 01/16/21  Yes Pyrtle, Lajuan Lines, MD  predniSONE (DELTASONE) 5 MG tablet TAKE 1 TABLET(5 MG) BY MOUTH DAILY 01/16/21  Yes Pyrtle, Lajuan Lines, MD  zolpidem (AMBIEN) 10 MG tablet Take 1 tablet (10 mg total) by mouth at bedtime as needed for sleep. 04/15/21 07/14/21 Yes Pyrtle, Lajuan Lines, MD     Vital Signs: BP 116/64 (BP Location: Right Arm)    Pulse 71    Temp 98.2 F (36.8 C) (Oral)    Resp 19    Ht _0  (1.676 m)    Wt 158 lb 15.2 oz (72.1 kg)    SpO2 98%    BMI 25.66 kg/m   Physical Exam Constitutional:      General: She is not in acute distress.    Appearance: She is ill-appearing.  Pulmonary:     Effort: Pulmonary effort is normal.  Abdominal:     Comments: Tenderness around the site of liver biopsy. Site is unremarkable. Patient on enteric precautions for C.diff.   Skin:    General: Skin is warm and dry.     Coloration: Skin is pale.  Neurological:     Mental Status: She is alert and  oriented to person, place, and time.    Imaging: CT ABDOMEN PELVIS WO CONTRAST  Result Date: 04/27/2021 CLINICAL DATA:  Postop.  Abdominal pain.  Nausea vomiting. EXAM: CT ABDOMEN AND PELVIS WITHOUT CONTRAST TECHNIQUE: Multidetector CT imaging of the abdomen and pelvis was performed following the standard protocol without IV contrast. COMPARISON:  04/19/2021. FINDINGS: Lower chest: Moderate left and small to moderate right pleural effusions. Left greater than right lower lobe opacities consistent with atelectasis. Scattered linear and reticular opacities elsewhere at the lung bases, also likely atelectasis. Pleural effusions have increased in size  from the prior CT. Hepatobiliary: Lateral segment of the left lobe appears larger than on the recent prior exam, particularly along its inferior margin. There is a vague area of relative hypoattenuation across the inferior aspect, segment 3. Remainder of the liver is unremarkable. Normal gallbladder. No bile duct dilation. Pancreas: Unremarkable. No pancreatic ductal dilatation or surrounding inflammatory changes. Spleen: Normal in size without focal abnormality. Adrenals/Urinary Tract: Adrenal glands are unremarkable. Kidneys are normal, without renal calculi, focal lesion, or hydronephrosis. Bladder is unremarkable. Stomach/Bowel: Minimal hiatal hernia. Stomach mostly decompressed. There is haziness increased attenuation along the wall of the gastric antrum. Small bowel and colon are normal in caliber. No wall thickening or inflammation. Vascular/Lymphatic: Aortic atherosclerosis. No aneurysm. No enlarged lymph nodes. Reproductive: Status post hysterectomy. No adnexal masses. Other: Small amount of hemoperitoneum primarily collecting in the pelvis.a additional small areas of hyperattenuation lie underneath the left hemidiaphragm suspected to be additional hemorrhage, abutting the lateral segment the left liver lobe. Musculoskeletal: No fracture or acute finding. IMPRESSION: 1. Suspected hemorrhage from the lateral segment of the left liver lobe. Lateral segment now larger than it was on the previous CT with an area of hypoattenuation in segment 3, which may reflect a contusion/laceration. The round lesion noted in segment 2 on the prior CT is not defined. There is apparent hemorrhage adjacent to the lateral segment, underneath the left hemidiaphragm and adjacent to the stomach. Definitive hemoperitoneum, small in amount, is seen in the pelvis. 2. No other acute abnormality within the abdomen or pelvis. 3. Moderate left and small right pleural effusion with associated lung base dependent opacity consistent with  atelectasis. Pleural effusions have increased in size from prior CT. Electronically Signed   By: Lajean Manes M.D.   On: 04/27/2021 15:56   DG Chest 1 View  Result Date: 04/26/2021 CLINICAL DATA:  Shortness of breath. EXAM: CHEST  1 VIEW COMPARISON:  Chest x-ray 03/24/2021 FINDINGS: Moderate size left pleural effusion is unchanged. There is a new small right pleural effusion. There are minimal bibasilar opacities. There is no pneumothorax. Cardiomediastinal silhouette is unchanged, the heart is mildly enlarged. Surgical clips are seen along the left chest wall. No acute fractures are identified. IMPRESSION: 1. New small right pleural effusion. 2. Stable moderate left pleural effusion. 3. Minimal bibasilar atelectasis/airspace disease. Electronically Signed   By: Ronney Asters M.D.   On: 04/26/2021 15:45   US RENAL  Result Date: 04/26/2021 CLINICAL DATA:  Elevated BUN and creatinine.  Short of breath. EXAM: RENAL / URINARY TRACT ULTRASOUND COMPLETE COMPARISON:  CT, 04/19/2021. FINDINGS: Right Kidney: Renal measurements: 11 x 1 x 4.2 x 5.3 cm = volume: 128.7 mL. Echogenicity within normal limits. No mass or hydronephrosis visualized. Left Kidney: Renal measurements: 11.0 x 4.5 x 4.8 cm = volume: 124 mL. Echogenicity within normal limits. No mass or hydronephrosis visualized. Bladder: Unremarkable. Hypo to anechoic areas adjacent to the bladder matter nonspecific, possibly  small amounts of peritoneal fluid versus fluid-filled bowel. Other: None. IMPRESSION: 1. Normal sonographic appearance of the kidneys. Electronically Signed   By: Lajean Manes M.D.   On: 04/26/2021 16:35   US BIOPSY (LIVER)  Result Date: 04/24/2021 INDICATION: 81 year old female with a history multiple liver lesions referred for biopsy EXAM: ULTRASOUND-GUIDED BIOPSY LIVER MASS MEDICATIONS: None. ANESTHESIA/SEDATION: Moderate (conscious) sedation was employed during this procedure. A total of Versed 1.5 mg and Fentanyl 100 mcg was  administered intravenously by the radiology nurse. Total intra-service moderate Sedation Time: 10 minutes. The patient's level of consciousness and vital signs were monitored continuously by radiology nursing throughout the procedure under my direct supervision. FLUOROSCOPY TIME:  Ultrasound none COMPLICATIONS: None PROCEDURE: Informed written consent was obtained from the patient after a thorough discussion of the procedural risks, benefits and alternatives. All questions were addressed. Maximal Sterile Barrier Technique was utilized including caps, mask, sterile gowns, sterile gloves, sterile drape, hand hygiene and skin antiseptic. A timeout was performed prior to the initiation of the procedure. Ultrasound survey of the right liver lobe performed with images stored and sent to PACs. The subxiphoid region was prepped with chlorhexidine in a sterile fashion, and a sterile drape was applied covering the operative field. A sterile gown and sterile gloves were used for the procedure. Local anesthesia was provided with 1% Lidocaine. The patient was prepped and draped sterilely and the skin and subcutaneous tissues were generously infiltrated with 1% lidocaine. A 17 gauge introducer needle was then advanced under ultrasound guidance in an intercostal location into the left liver lobe, targeting left liver mass. The stylet was removed, and multiple separate 18 gauge core biopsy were retrieved. Samples were placed into formalin for transportation to the lab. Gel-Foam pledgets were then infused with a small amount of saline for assistance with hemostasis. The needle was removed, and a final ultrasound image was performed. The patient tolerated the procedure well and remained hemodynamically stable throughout. No complications were encountered and no significant blood loss was encounter. IMPRESSION: Status post ultrasound-guided biopsy of left liver mass Signed, Dulcy Fanny. Dellia Nims, RPVI [facility] Electronically Signed   By: Corrie Mckusick D.O.   On: 04/24/2021 14:43   ECHOCARDIOGRAM COMPLETE  Result Date: 04/27/2021    ECHOCARDIOGRAM REPORT   Patient Name:   BAYLA MCGOVERN Date of Exam: 04/27/2021 Medical Rec #:  [id number]      Height:       66.0 in Accession #:    [id number]     Weight:       159.0 lb Date of Birth:  [birth year]       BSA:          1.814 m Patient Age:    81 years       BP:           121/64 mmHg Patient Gender: F              HR:           87 bpm. Exam Location:  Inpatient Procedure: 2D Echo, Cardiac Doppler and Color Doppler Indications:    Abnormal ECG R94.31  History:        Patient has prior history of Echocardiogram examinations, most                 recent 06/22/2016. Risk Factors:Dyslipidemia. GERD. Past history                 of  breast cancer.  Sonographer:    Johny Chess RDCS Referring Phys: [id number] [place]  Sonographer Comments: Image acquisition challenging due to breast implants. Exam ended per patients request. IMPRESSIONS  1. Poor image quality due to breast implants and patient stopped exam before done.  2. Left ventricular ejection fraction, by estimation, is 60 to 65%. The left ventricle has normal function. The left ventricle has no regional wall motion abnormalities. Left ventricular diastolic parameters were normal.  3. Right ventricular systolic function is normal. The right ventricular size is normal. There is normal pulmonary artery systolic pressure.  4. The mitral valve is normal in structure. No evidence of mitral valve regurgitation. No evidence of mitral stenosis.  5. The aortic valve was not well visualized. Aortic valve regurgitation is not visualized. No aortic stenosis is present.  6. The inferior vena cava is normal in size with greater than 50% respiratory variability, suggesting right atrial pressure of 3 mmHg. FINDINGS  Left Ventricle: Left ventricular ejection fraction, by estimation, is 60  to 65%. The left ventricle has normal function. The left ventricle has no regional wall motion abnormalities. The left ventricular internal cavity size was normal in size. There is  no left ventricular hypertrophy. Left ventricular diastolic parameters were normal. Right Ventricle: The right ventricular size is normal. No increase in right ventricular wall thickness. Right ventricular systolic function is normal. There is normal pulmonary artery systolic pressure. The tricuspid regurgitant velocity is 2.28 m/s, and  with an assumed right atrial pressure of 8 mmHg, the estimated right ventricular systolic pressure is 97.3 mmHg. Left Atrium: Left atrial size was normal in size. Right Atrium: Right atrial size was normal in size. Pericardium: There is no evidence of pericardial effusion. Mitral Valve: The mitral valve is normal in structure. No evidence of mitral valve regurgitation. No evidence of mitral valve stenosis. Tricuspid Valve: The tricuspid valve is normal in structure. Tricuspid valve regurgitation is not demonstrated. No evidence of tricuspid stenosis. Aortic Valve: The aortic valve was not well visualized. Aortic valve regurgitation is not visualized. No aortic stenosis is present. Pulmonic Valve: The pulmonic valve was normal in structure. Pulmonic valve regurgitation is not visualized. No evidence of pulmonic stenosis. Aorta: The aortic root is normal in size and structure. Venous: The inferior vena cava is normal in size with greater than 50% respiratory variability, suggesting right atrial pressure of 3 mmHg. IAS/Shunts: The interatrial septum was not assessed. Additional Comments: Poor image quality due to breast implants and patient stopped exam before done.  LEFT VENTRICLE PLAX 2D LVIDd:         3.70 cm   Diastology LVIDs:         2.30 cm   LV e' medial:    8.03 cm/s LV PW:         1.00 cm   LV E/e' medial:  11.8 LV IVS:        1.10 cm   LV e' lateral:   7.62 cm/s LVOT diam:     1.90 cm   LV E/e'  lateral: 12.4 LV SV:         57 LV SV Index:   32 LVOT Area:     2.84 cm  RIGHT VENTRICLE RV S prime:     16.30 cm/s TAPSE (M-mode): 1.6 cm LEFT ATRIUM             Index        RIGHT ATRIUM  Index LA diam:        3.50 cm 1.93 cm/m   RA Area:     12.90 cm LA Vol (A2C):   32.9 ml 18.14 ml/m  RA Volume:   27.80 ml  15.33 ml/m LA Vol (A4C):   29.7 ml 16.37 ml/m LA Biplane Vol: 31.6 ml 17.42 ml/m  AORTIC VALVE LVOT Vmax:   125.00 cm/s LVOT Vmean:  66.600 cm/s LVOT VTI:    0.202 m  AORTA Ao Root diam: 2.90 cm Ao Asc diam:  2.80 cm MITRAL VALVE               TRICUSPID VALVE MV Area (PHT): 3.99 cm    TR Peak grad:   20.8 mmHg MV Decel Time: 190 msec    TR Vmax:        228.00 cm/s MV E velocity: 94.70 cm/s MV A velocity: 90.00 cm/s  SHUNTS MV E/A ratio:  1.05        Systemic VTI:  0.20 m                            Systemic Diam: 1.90 cm Jenkins Rouge MD Electronically signed by Jenkins Rouge MD Signature Date/Time: 04/27/2021/12:08:59 PM    Final     Labs:  CBC: Recent Labs    04/23/21 0415 04/24/21 0432 04/28/21 0721 04/28/21 1111  WBC 7.6 9.0 8.8 9.5  HGB 13.4 14.9 8.7* 8.8*  HCT 40.2 43.7 25.7* 26.8*  PLT 229 288 198 212    COAGS: Recent Labs    04/23/21 0415  INR 1.0    BMP: Recent Labs    04/26/21 0435 04/26/21 1647 04/27/21 0530 04/28/21 0521  NA 129* 126* 133* 130*  K 4.2 3.5 3.5 2.9*  CL 94* 98 98 99  CO2 24 20* 24 24  GLUCOSE 120* 121* 95 94  BUN 28* 31* 29* 26*  CALCIUM 8.7* 8.2* 9.0 8.9  CREATININE 2.28* 2.15* 1.77* 1.48*  GFRNONAA 21* 23* 29* 36*    LIVER FUNCTION TESTS: Recent Labs    04/21/21 0458 04/22/21 0407 04/23/21 0415 04/24/21 0432  BILITOT 0.5 0.6 0.4 0.6  AST _0 ALT _1 ALKPHOS 70 65 67 80  PROT 5.4* 5.5* 6.2* 7.2  ALBUMIN 2.9* 2.9* 3.4* 3.7    Assessment and Plan:  Liver lesion s/p biopsy 04/24/21 with post-procedure hematoma and anemia   I met with the patient and her daughter today to let them know that IR  is aware of the current situation. I introduced the possibility of an embolization of the area in the liver if it continues to bleed and briefly explained the procedure.  I reassured the patient and her daughter that IR will be closely following the patient's clinical course/labs and will be ready to intervene if needed. They know that no procedure is planned at this point. The patient is scheduled to receive PRBCs today and the primary team will be ordering frequent labs to monitor hemoglobin levels.   IR will continue to follow.   Electronically Signed: Soyla Dryer, AGACNP-BC [phone] 04/28/2021, 11:42 AM   I spent a total of 15 Minutes at the the patient's bedside AND on the patient's hospital floor or unit, greater than 50% of which was counseling/coordinating care for liver biopsy with post-procedure liver hematoma

## 2021-04-28 NOTE — Progress Notes (Signed)
°   04/28/21 1200  Mobility  Activity Dangled on edge of bed  Range of Motion/Exercises Right leg;Left leg;Active  Mobility Response Tolerated well  Mobility performed by Mobility specialist  $Mobility charge 1 Mobility   Pt reluctant to ambulate today, however was agreeable to mobilize this morning. Pt sat EOB and completed 10x leg extensions/leg. Pt continued to sit EOB for several minutes before lying back down. She noted that she was feeling a squeezing chest pain during the session, that would wax and wan. Left pt in bed with call bell at side and family present. RN notified of session.   [facility] Acute Rehab Services Office: [phone]

## 2021-04-28 NOTE — Progress Notes (Signed)
Pt not taking scheduled oral vancomycin stating that "it runs right through her" causing diarrhea.

## 2021-04-28 NOTE — Progress Notes (Signed)
PROGRESS NOTE    Sara Barnes  SPQ:[id number] DOB: Feb 09, 1941 DOA: 04/19/2021 PCP: Ann Held, DO   Brief Narrative: The patient is a 81 year old Caucasian female with a past medical history significant for Crohn's disease, depression, insomnia, dyspepsia admitted with C. difficile colitis with complaints of nausea vomiting and diarrhea.  She is status post biopsy of the liver.   Assessment & Plan:   Principal Problem:   Intractable nausea and vomiting Active Problems:   Depression   Diarrhea   Crohn disease (HCC)   Hyponatremia   Clostridium difficile colitis   #1 C. difficile colitis patient presented with nausea vomiting and diarrhea on oral vancomycin with improvement in her diarrhea.  However she remains very weak and unable to ambulate by herself with a walker.      #2 chest discomfort and shortness of breath -chest x-ray revealed new small left effusion and right pleural effusion.  EKG was in A. fib RVR she was given Lopressor 5 mg IV and was continued on metoprolol twice daily.  Today she is back to sinus rhythm and rate controlled.  Her troponin was mildly elevated at 27 which is likely due to demand ischemia.  Was also given a dose of Lasix with improvement in her breathing. Echo ejection fraction 60 to 65% with normal left ventricular function no regional wall motion abnormalities. Normal TSH. Troponin 27.  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  Now with CT of the abdomen showing new hepatic lesions suspicious for metastatic cancer.  She is status post biopsy on 04/24/2021.  #4 hypokalemia potassium 2.9 magnesium pending replete potassium and recheck labs in a.m.  #5 history of anxiety and depression continue home meds  #6 hyponatremia likely multifactorial sodium improved with normal saline to 130.  However she was volume overloaded with third spacing and hypoalbuminemia.  IV fluids were then stopped.   Monitor sodium daily.    #7 AKI creatinine 1.48 from 1.77 from 2.28.  Renal ultrasound shows no evidence of hydronephrosis.    #8 insomnia patient has been taking Ambien 10 mg at bedtime for many number of years and she claims that she cannot sleep without it.  #9 acute anemia hemoglobin with significant drop from 14-8 with CT evidence of hemoperitoneum and liver laceration since liver biopsy.  Will monitor closely give her 1 unit of blood transfusion today DC Lovenox Repeat CT in 24 hours Consulted general surgery appreciate their input DW IR dr Anselm Pancoast will follow up  Nutrition Problem: Inadequate oral intake Etiology: acute illness  Signs/Symptoms: per patient/family report  Interventions: Boost Breeze, Prostat  Estimated body mass index is 25.66 kg/m as calculated from the following:   Height as of this encounter: 5' 6"  (1.676 m).   Weight as of this encounter: 72.1 kg.  DVT prophylaxis: none due to bleed   code Status: Full code Family Communication: None at bedside today.  Discussed with sister on the phone yesterday.   Disposition Plan:  Status is: Inpatient  Remains inpatient appropriate because: Needs IV fluids   Consultants:  Oncology interventional radiology  Procedures: Biopsy of the liver lesion Antimicrobials: P.o. vancomycin  Subjective: Patient resting in bed continues to complain of upper chest pain at the site of biopsy Still with some wheezing though better than yesterday Had multiple small BMs yesterday not loose  Objective: Vitals:   04/27/21 2124 04/27/21 2128 04/28/21 0553 04/28/21 0929  BP: 131/64 131/64 116/64  Pulse: 86 82 71   Resp:  18 19   Temp:  98.2 F (36.8 C)    TempSrc:  Oral    SpO2:  97% 99% 98%  Weight:      Height:        Intake/Output Summary (Last 24 hours) at 04/28/2021 1008 Last data filed at 04/28/2021 0932 Gross per 24 hour  Intake 266.32 ml  Output 800 ml  Net -533.68 ml    Filed Weights   04/19/21 2130  Weight:  72.1 kg    Examination:  General exam: Appears chronically ill looking frail elderly female Respiratory system: Diminished breath sounds at the bases to auscultation. Respiratory effort normal. Cardiovascular system: S1 & S2 heard, RRR. No JVD, murmurs, rubs, gallops or clicks. No pedal edema. Gastrointestinal system: Abdomen is nondistended, soft and nontender. No organomegaly or masses felt. Normal bowel sounds heard. Central nervous system: Alert and oriented. No focal neurological deficits. Extremities: Symmetric 5 x 5 power. Skin: No rashes, lesions or ulcers Psychiatry: Judgement and insight appear normal. Mood & affect appropriate.     Data Reviewed: I have personally reviewed following labs and imaging studies  CBC: Recent Labs  Lab 04/22/21 0407 04/23/21 0415 04/24/21 0432 04/28/21 0721  WBC 8.5 7.6 9.0 8.8  NEUTROABS 5.9 5.4 5.9  --   HGB 12.8 13.4 14.9 8.7*  HCT 38.4 40.2 43.7 25.7*  MCV 94.8 93.1 92.4 95.9  PLT 200 229 288 622    Basic Metabolic Panel: Recent Labs  Lab 04/22/21 0407 04/23/21 0415 04/24/21 0432 04/26/21 0435 04/26/21 1647 04/27/21 0530 04/28/21 0521  NA 133* 133* 132* 129* 126* 133* 130*  K 4.4 3.6 3.5 4.2 3.5 3.5 2.9*  CL 102 99 98 94* 98 98 99  CO2 26 28 24 24  20* 24 24  GLUCOSE 90 96 102* 120* 121* 95 94  BUN 9 9 11  28* 31* 29* 26*  CREATININE 0.85 0.93 0.97 2.28* 2.15* 1.77* 1.48*  CALCIUM 8.6* 8.6* 9.1 8.7* 8.2* 9.0 8.9  MG 2.2 2.0 2.0  --   --   --   --   PHOS 3.4 4.8* 4.5  --   --   --   --     GFR: Estimated Creatinine Clearance: 31.3 mL/min (A) (by C-G formula based on SCr of 1.48 mg/dL (H)). Liver Function Tests: Recent Labs  Lab 04/22/21 0407 04/23/21 0415 04/24/21 0432  AST 16 18 22   ALT 11 14 19   ALKPHOS 65 67 80  BILITOT 0.6 0.4 0.6  PROT 5.5* 6.2* 7.2  ALBUMIN 2.9* 3.4* 3.7    No results for input(s): LIPASE, AMYLASE in the last 168 hours. No results for input(s): AMMONIA in the last 168  hours. Coagulation Profile: Recent Labs  Lab 04/23/21 0415  INR 1.0    Cardiac Enzymes: No results for input(s): CKTOTAL, CKMB, CKMBINDEX, TROPONINI in the last 168 hours. BNP (last 3 results) No results for input(s): PROBNP in the last 8760 hours. HbA1C: No results for input(s): HGBA1C in the last 72 hours. CBG: No results for input(s): GLUCAP in the last 168 hours. Lipid Profile: No results for input(s): CHOL, HDL, LDLCALC, TRIG, CHOLHDL, LDLDIRECT in the last 72 hours. Thyroid Function Tests: Recent Labs    04/26/21 1856  TSH 1.286    Anemia Panel: No results for input(s): VITAMINB12, FOLATE, FERRITIN, TIBC, IRON, RETICCTPCT in the last 72 hours. Sepsis Labs: No results for input(s): PROCALCITON, LATICACIDVEN in the last 168 hours.  Recent Results (  from the past 240 hour(s))  Resp Panel by RT-PCR (Flu A&B, Covid) Nasopharyngeal Swab     Status: None   Collection Time: 04/19/21 12:48 PM   Specimen: Nasopharyngeal Swab; Nasopharyngeal(NP) swabs in vial transport medium  Result Value Ref Range Status   SARS Coronavirus 2 by RT PCR NEGATIVE NEGATIVE Final    Comment: (NOTE) SARS-CoV-2 target nucleic acids are NOT DETECTED.  The SARS-CoV-2 RNA is generally detectable in upper respiratory specimens during the acute phase of infection. The lowest concentration of SARS-CoV-2 viral copies this assay can detect is 138 copies/mL. A negative result does not preclude SARS-Cov-2 infection and should not be used as the sole basis for treatment or other patient management decisions. A negative result may occur with  improper specimen collection/handling, submission of specimen other than nasopharyngeal swab, presence of viral mutation(s) within the areas targeted by this assay, and inadequate number of viral copies(<138 copies/mL). A negative result must be combined with clinical observations, patient history, and epidemiological information. The expected result is  Negative.  Fact Sheet for Patients:  EntrepreneurPulse.com.au  Fact Sheet for Healthcare Providers:  IncredibleEmployment.be  This test is no t yet approved or cleared by the [place] FDA and  has been authorized for detection and/or diagnosis of SARS-CoV-2 by FDA under an Emergency Use Authorization (EUA). This EUA will remain  in effect (meaning this test can be used) for the duration of the COVID-19 declaration under Section 564(b)(1) of the Act, 21 U.S.C.section 360bbb-3(b)(1), unless the authorization is terminated  or revoked sooner.       Influenza A by PCR NEGATIVE NEGATIVE Final   Influenza B by PCR NEGATIVE NEGATIVE Final    Comment: (NOTE) The Xpert Xpress SARS-CoV-2/FLU/RSV plus assay is intended as an aid in the diagnosis of influenza from Nasopharyngeal swab specimens and should not be used as a sole basis for treatment. Nasal washings and aspirates are unacceptable for Xpert Xpress SARS-CoV-2/FLU/RSV testing.  Fact Sheet for Patients: EntrepreneurPulse.com.au  Fact Sheet for Healthcare Providers: IncredibleEmployment.be  This test is not yet approved or cleared by the [place] FDA and has been authorized for detection and/or diagnosis of SARS-CoV-2 by FDA under an Emergency Use Authorization (EUA). This EUA will remain in effect (meaning this test can be used) for the duration of the COVID-19 declaration under Section 564(b)(1) of the Act, 21 U.S.C. section 360bbb-3(b)(1), unless the authorization is terminated or revoked.  Performed at [facility], [place] [address] [place], [place] [address]   Urine Culture     Status: None   Collection Time: 04/19/21 12:56 PM   Specimen: Urine, Clean Catch  Result Value Ref Range Status   Specimen Description   Final    URINE, CLEAN CATCH Performed at [facility], [place] [address] [place], [place]  [address]    Special Requests   Final    NONE Performed at [facility], [place] [address] [place], [place] [address]    Culture   Final    NO GROWTH Performed at [facility], [place] [address], [place], [place] [address]    Report Status 04/20/2021 FINAL  Final  Gastrointestinal Panel by PCR , Stool     Status: None   Collection Time: 04/20/21 10:01 AM   Specimen: STOOL  Result Value Ref Range Status   Campylobacter species NOT DETECTED NOT DETECTED Final   Plesimonas shigelloides NOT DETECTED NOT DETECTED Final   Salmonella species NOT DETECTED NOT DETECTED  Final   Yersinia enterocolitica NOT DETECTED NOT DETECTED Final   Vibrio species NOT DETECTED NOT DETECTED Final   Vibrio cholerae NOT DETECTED NOT DETECTED Final   Enteroaggregative E coli (EAEC) NOT DETECTED NOT DETECTED Final   Enteropathogenic E coli (EPEC) NOT DETECTED NOT DETECTED Final   Enterotoxigenic E coli (ETEC) NOT DETECTED NOT DETECTED Final   Shiga like toxin producing E coli (STEC) NOT DETECTED NOT DETECTED Final   Shigella/Enteroinvasive E coli (EIEC) NOT DETECTED NOT DETECTED Final   Cryptosporidium NOT DETECTED NOT DETECTED Final   Cyclospora cayetanensis NOT DETECTED NOT DETECTED Final   Entamoeba histolytica NOT DETECTED NOT DETECTED Final   Giardia lamblia NOT DETECTED NOT DETECTED Final   Adenovirus F40/41 NOT DETECTED NOT DETECTED Final   Astrovirus NOT DETECTED NOT DETECTED Final   Norovirus GI/GII NOT DETECTED NOT DETECTED Final   Rotavirus A NOT DETECTED NOT DETECTED Final   Sapovirus (I, II, IV, and V) NOT DETECTED NOT DETECTED Final    Comment: Performed at [facility], [place] [place], [place] [address]  C Difficile Quick Screen w PCR reflex     Status: Abnormal   Collection Time: 04/20/21 10:01 AM   Specimen: STOOL  Result Value Ref Range Status   C Diff antigen POSITIVE (A) NEGATIVE Final   C Diff toxin NEGATIVE NEGATIVE Final   C Diff interpretation  Results are indeterminate. See PCR results.  Final    Comment: Performed at [facility], [place] [address], [place], [place] [address]  C. Diff by PCR, Reflexed     Status: Abnormal   Collection Time: 04/20/21 10:01 AM  Result Value Ref Range Status   Toxigenic C. Difficile by PCR POSITIVE (A) NEGATIVE Final    Comment: Positive for toxigenic C. difficile with little to no toxin production. Only treat if clinical presentation suggests symptomatic illness. Performed at [facility], [place] [address], [place], [place] [address]           Radiology Studies: CT ABDOMEN PELVIS WO CONTRAST  Result Date: 04/27/2021 CLINICAL DATA:  Postop.  Abdominal pain.  Nausea vomiting. EXAM: CT ABDOMEN AND PELVIS WITHOUT CONTRAST TECHNIQUE: Multidetector CT imaging of the abdomen and pelvis was performed following the standard protocol without IV contrast. COMPARISON:  04/19/2021. FINDINGS: Lower chest: Moderate left and small to moderate right pleural effusions. Left greater than right lower lobe opacities consistent with atelectasis. Scattered linear and reticular opacities elsewhere at the lung bases, also likely atelectasis. Pleural effusions have increased in size from the prior CT. Hepatobiliary: Lateral segment of the left lobe appears larger than on the recent prior exam, particularly along its inferior margin. There is a vague area of relative hypoattenuation across the inferior aspect, segment 3. Remainder of the liver is unremarkable. Normal gallbladder. No bile duct dilation. Pancreas: Unremarkable. No pancreatic ductal dilatation or surrounding inflammatory changes. Spleen: Normal in size without focal abnormality. Adrenals/Urinary Tract: Adrenal glands are unremarkable. Kidneys are normal, without renal calculi, focal lesion, or hydronephrosis. Bladder is unremarkable. Stomach/Bowel: Minimal hiatal hernia. Stomach mostly decompressed. There is haziness increased attenuation along  the wall of the gastric antrum. Small bowel and colon are normal in caliber. No wall thickening or inflammation. Vascular/Lymphatic: Aortic atherosclerosis. No aneurysm. No enlarged lymph nodes. Reproductive: Status post hysterectomy. No adnexal masses. Other: Small amount of hemoperitoneum primarily collecting in the pelvis.a additional small areas of hyperattenuation lie underneath the left hemidiaphragm suspected to be additional hemorrhage, abutting the lateral segment  the left liver lobe. Musculoskeletal: No fracture or acute finding. IMPRESSION: 1. Suspected hemorrhage from the lateral segment of the left liver lobe. Lateral segment now larger than it was on the previous CT with an area of hypoattenuation in segment 3, which may reflect a contusion/laceration. The round lesion noted in segment 2 on the prior CT is not defined. There is apparent hemorrhage adjacent to the lateral segment, underneath the left hemidiaphragm and adjacent to the stomach. Definitive hemoperitoneum, small in amount, is seen in the pelvis. 2. No other acute abnormality within the abdomen or pelvis. 3. Moderate left and small right pleural effusion with associated lung base dependent opacity consistent with atelectasis. Pleural effusions have increased in size from prior CT. Electronically Signed   By: Lajean Manes M.D.   On: 04/27/2021 15:56   DG Chest 1 View  Result Date: 04/26/2021 CLINICAL DATA:  Shortness of breath. EXAM: CHEST  1 VIEW COMPARISON:  Chest x-ray 03/24/2021 FINDINGS: Moderate size left pleural effusion is unchanged. There is a new small right pleural effusion. There are minimal bibasilar opacities. There is no pneumothorax. Cardiomediastinal silhouette is unchanged, the heart is mildly enlarged. Surgical clips are seen along the left chest wall. No acute fractures are identified. IMPRESSION: 1. New small right pleural effusion. 2. Stable moderate left pleural effusion. 3. Minimal bibasilar atelectasis/airspace  disease. Electronically Signed   By: Ronney Asters M.D.   On: 04/26/2021 15:45   US RENAL  Result Date: 04/26/2021 CLINICAL DATA:  Elevated BUN and creatinine.  Short of breath. EXAM: RENAL / URINARY TRACT ULTRASOUND COMPLETE COMPARISON:  CT, 04/19/2021. FINDINGS: Right Kidney: Renal measurements: 11 x 1 x 4.2 x 5.3 cm = volume: 128.7 mL. Echogenicity within normal limits. No mass or hydronephrosis visualized. Left Kidney: Renal measurements: 11.0 x 4.5 x 4.8 cm = volume: 124 mL. Echogenicity within normal limits. No mass or hydronephrosis visualized. Bladder: Unremarkable. Hypo to anechoic areas adjacent to the bladder matter nonspecific, possibly small amounts of peritoneal fluid versus fluid-filled bowel. Other: None. IMPRESSION: 1. Normal sonographic appearance of the kidneys. Electronically Signed   By: Lajean Manes M.D.   On: 04/26/2021 16:35   ECHOCARDIOGRAM COMPLETE  Result Date: 04/27/2021    ECHOCARDIOGRAM REPORT   Patient Name:   LAMEES GABLE Date of Exam: 04/27/2021 Medical Rec #:  [id number]      Height:       66.0 in Accession #:    [id number]     Weight:       159.0 lb Date of Birth:  Nov 25, 1940       BSA:          1.814 m Patient Age:    38 years       BP:           121/64 mmHg Patient Gender: F              HR:           87 bpm. Exam Location:  Inpatient Procedure: 2D Echo, Cardiac Doppler and Color Doppler Indications:    Abnormal ECG R94.31  History:        Patient has prior history of Echocardiogram examinations, most                 recent 06/22/2016. Risk Factors:Dyslipidemia. GERD. Past history                 of breast cancer.  Sonographer:    [place] Referring Phys:  [id number] [place] Comments: Image acquisition challenging due to breast implants. Exam ended per patients request. IMPRESSIONS  1. Poor image quality due to breast implants and patient stopped exam before done.  2. Left ventricular ejection fraction, by estimation, is 60 to 65%. The  left ventricle has normal function. The left ventricle has no regional wall motion abnormalities. Left ventricular diastolic parameters were normal.  3. Right ventricular systolic function is normal. The right ventricular size is normal. There is normal pulmonary artery systolic pressure.  4. The mitral valve is normal in structure. No evidence of mitral valve regurgitation. No evidence of mitral stenosis.  5. The aortic valve was not well visualized. Aortic valve regurgitation is not visualized. No aortic stenosis is present.  6. The inferior vena cava is normal in size with greater than 50% respiratory variability, suggesting right atrial pressure of 3 mmHg. FINDINGS  Left Ventricle: Left ventricular ejection fraction, by estimation, is 60 to 65%. The left ventricle has normal function. The left ventricle has no regional wall motion abnormalities. The left ventricular internal cavity size was normal in size. There is  no left ventricular hypertrophy. Left ventricular diastolic parameters were normal. Right Ventricle: The right ventricular size is normal. No increase in right ventricular wall thickness. Right ventricular systolic function is normal. There is normal pulmonary artery systolic pressure. The tricuspid regurgitant velocity is 2.28 m/s, and  with an assumed right atrial pressure of 8 mmHg, the estimated right ventricular systolic pressure is 50.0 mmHg. Left Atrium: Left atrial size was normal in size. Right Atrium: Right atrial size was normal in size. Pericardium: There is no evidence of pericardial effusion. Mitral Valve: The mitral valve is normal in structure. No evidence of mitral valve regurgitation. No evidence of mitral valve stenosis. Tricuspid Valve: The tricuspid valve is normal in structure. Tricuspid valve regurgitation is not demonstrated. No evidence of tricuspid stenosis. Aortic Valve: The aortic valve was not well visualized. Aortic valve regurgitation is not visualized. No aortic  stenosis is present. Pulmonic Valve: The pulmonic valve was normal in structure. Pulmonic valve regurgitation is not visualized. No evidence of pulmonic stenosis. Aorta: The aortic root is normal in size and structure. Venous: The inferior vena cava is normal in size with greater than 50% respiratory variability, suggesting right atrial pressure of 3 mmHg. IAS/Shunts: The interatrial septum was not assessed. Additional Comments: Poor image quality due to breast implants and patient stopped exam before done.  LEFT VENTRICLE PLAX 2D LVIDd:         3.70 cm   Diastology LVIDs:         2.30 cm   LV e' medial:    8.03 cm/s LV PW:         1.00 cm   LV E/e' medial:  11.8 LV IVS:        1.10 cm   LV e' lateral:   7.62 cm/s LVOT diam:     1.90 cm   LV E/e' lateral: 12.4 LV SV:         57 LV SV Index:   32 LVOT Area:     2.84 cm  RIGHT VENTRICLE RV S prime:     16.30 cm/s TAPSE (M-mode): 1.6 cm LEFT ATRIUM             Index        RIGHT ATRIUM           Index LA diam:  3.50 cm 1.93 cm/m   RA Area:     12.90 cm LA Vol (A2C):   32.9 ml 18.14 ml/m  RA Volume:   27.80 ml  15.33 ml/m LA Vol (A4C):   29.7 ml 16.37 ml/m LA Biplane Vol: 31.6 ml 17.42 ml/m  AORTIC VALVE LVOT Vmax:   125.00 cm/s LVOT Vmean:  66.600 cm/s LVOT VTI:    0.202 m  AORTA Ao Root diam: 2.90 cm Ao Asc diam:  2.80 cm MITRAL VALVE               TRICUSPID VALVE MV Area (PHT): 3.99 cm    TR Peak grad:   20.8 mmHg MV Decel Time: 190 msec    TR Vmax:        228.00 cm/s MV E velocity: 94.70 cm/s MV A velocity: 90.00 cm/s  SHUNTS MV E/A ratio:  1.05        Systemic VTI:  0.20 m                            Systemic Diam: 1.90 cm Jenkins Rouge MD Electronically signed by Jenkins Rouge MD Signature Date/Time: 04/27/2021/12:08:59 PM    Final         Scheduled Meds:  (feeding supplement) PROSource Plus  30 mL Oral BID BM   enoxaparin (LOVENOX) injection  30 mg Subcutaneous Daily   feeding supplement  1 Container Oral TID BM   FLUoxetine  20 mg Oral Daily    multivitamin with minerals  1 tablet Oral Daily   potassium chloride  40 mEq Oral Q2H   predniSONE  5 mg Oral Q breakfast   vancomycin  125 mg Oral Q6H   Continuous Infusions:  famotidine (PEPCID) IV 20 mg (04/27/21 1255)   promethazine (PHENERGAN) injection (IM or IVPB)       LOS: 9 days    Time spent: 45 minutes  Georgette Shell, MD  04/28/2021, 10:08 AM

## 2021-04-29 ENCOUNTER — Inpatient Hospital Stay (HOSPITAL_COMMUNITY): Payer: Medicare HMO

## 2021-04-29 DIAGNOSIS — R112 Nausea with vomiting, unspecified: Secondary | ICD-10-CM | POA: Diagnosis not present

## 2021-04-29 LAB — TYPE AND SCREEN
ABO/RH(D): A NEG
Antibody Screen: NEGATIVE
Unit division: 0

## 2021-04-29 LAB — CBC
HCT: 31.6 % — ABNORMAL LOW (ref 36.0–46.0)
Hemoglobin: 10.4 g/dL — ABNORMAL LOW (ref 12.0–15.0)
MCH: 31.2 pg (ref 26.0–34.0)
MCHC: 32.9 g/dL (ref 30.0–36.0)
MCV: 94.9 fL (ref 80.0–100.0)
Platelets: 249 10*3/uL (ref 150–400)
RBC: 3.33 MIL/uL — ABNORMAL LOW (ref 3.87–5.11)
RDW: 12.6 % (ref 11.5–15.5)
WBC: 9.9 10*3/uL (ref 4.0–10.5)
nRBC: 0 % (ref 0.0–0.2)

## 2021-04-29 LAB — COMPREHENSIVE METABOLIC PANEL WITH GFR
ALT: 13 U/L (ref 0–44)
AST: 17 U/L (ref 15–41)
Albumin: 4 g/dL (ref 3.5–5.0)
Alkaline Phosphatase: 82 U/L (ref 38–126)
Anion gap: 9 (ref 5–15)
BUN: 21 mg/dL (ref 8–23)
CO2: 22 mmol/L (ref 22–32)
Calcium: 9.2 mg/dL (ref 8.9–10.3)
Chloride: 101 mmol/L (ref 98–111)
Creatinine, Ser: 1.17 mg/dL — ABNORMAL HIGH (ref 0.44–1.00)
GFR, Estimated: 47 mL/min — ABNORMAL LOW
Glucose, Bld: 94 mg/dL (ref 70–99)
Potassium: 3.8 mmol/L (ref 3.5–5.1)
Sodium: 132 mmol/L — ABNORMAL LOW (ref 135–145)
Total Bilirubin: 1.3 mg/dL — ABNORMAL HIGH (ref 0.3–1.2)
Total Protein: 6.6 g/dL (ref 6.5–8.1)

## 2021-04-29 LAB — BPAM RBC
Blood Product Expiration Date: 202301212359
ISSUE DATE / TIME: 202301011606
Unit Type and Rh: 600

## 2021-04-29 MED ORDER — DICLOFENAC SODIUM 1 % EX GEL
2.0000 g | Freq: Four times a day (QID) | CUTANEOUS | Status: DC
  Administered 2021-04-29 – 2021-05-16 (×56): 2 g via TOPICAL
  Filled 2021-04-29: qty 100

## 2021-04-29 MED ORDER — VANCOMYCIN HCL 125 MG PO CAPS
125.0000 mg | ORAL_CAPSULE | Freq: Four times a day (QID) | ORAL | Status: AC
  Administered 2021-04-29 – 2021-05-07 (×26): 125 mg via ORAL
  Filled 2021-04-29 (×37): qty 1

## 2021-04-29 MED ORDER — FUROSEMIDE 10 MG/ML IJ SOLN
40.0000 mg | Freq: Every day | INTRAMUSCULAR | Status: AC
  Administered 2021-04-29 – 2021-05-01 (×3): 40 mg via INTRAVENOUS
  Filled 2021-04-29 (×3): qty 4

## 2021-04-29 MED ORDER — HYDROMORPHONE HCL 1 MG/ML IJ SOLN
1.0000 mg | INTRAMUSCULAR | Status: DC | PRN
  Administered 2021-04-29 – 2021-05-05 (×27): 1 mg via INTRAVENOUS
  Filled 2021-04-29 (×28): qty 1

## 2021-04-29 MED ORDER — METOPROLOL TARTRATE 5 MG/5ML IV SOLN
2.5000 mg | Freq: Once | INTRAVENOUS | Status: AC
  Administered 2021-04-29: 2.5 mg via INTRAVENOUS
  Filled 2021-04-29: qty 5

## 2021-04-29 MED ORDER — ALPRAZOLAM 0.5 MG PO TABS
0.5000 mg | ORAL_TABLET | Freq: Three times a day (TID) | ORAL | Status: DC | PRN
  Administered 2021-05-01 (×3): 0.5 mg via ORAL
  Filled 2021-04-29 (×3): qty 1

## 2021-04-29 MED ORDER — METOPROLOL TARTRATE 5 MG/5ML IV SOLN
5.0000 mg | Freq: Once | INTRAVENOUS | Status: AC
  Administered 2021-04-29: 5 mg via INTRAVENOUS
  Filled 2021-04-29: qty 5

## 2021-04-29 MED ORDER — METOPROLOL TARTRATE 12.5 MG HALF TABLET
12.5000 mg | ORAL_TABLET | Freq: Two times a day (BID) | ORAL | Status: DC
  Administered 2021-04-29 – 2021-05-16 (×33): 12.5 mg via ORAL
  Filled 2021-04-29 (×34): qty 1

## 2021-04-29 NOTE — Progress Notes (Signed)
Patient ID: Sara Barnes, female   DOB: [birth year], 81 y.o.   MRN: [id number] [facility] Surgery Progress Note:   * No surgery found *  Subjective: Mental status is clear.  Complaints she had a rough night last night with pressure in her chest and pain with breathing. Objective: Vital signs in last 24 hours: Temp:  [97.6 F (36.4 C)-98.4 F (36.9 C)] 98.4 F (36.9 C) (01/02 0341) Pulse Rate:  [68-89] 89 (01/02 0341) Resp:  [18-20] 20 (01/01 1823) BP: (128-166)/(58-89) 166/89 (01/02 0341) SpO2:  [96 %-100 %] 100 % (01/02 0341)  Intake/Output from previous day: 01/01 0701 - 01/02 0700 In: 333 [I.V.:25; Blood:258; IV Piggyback:50] Out: 800 [Urine:800] Intake/Output this shift: No intake/output data recorded.  Physical Exam: Work of breathing is not labored at present but she has rather shallow respirations.  Some tenderness on the right but not generalized  Lab Results:  Results for orders placed or performed during the hospital encounter of 04/19/21 (from the past 48 hour(s))  Magnesium     Status: None   Collection Time: 04/28/21  5:17 AM  Result Value Ref Range   Magnesium 2.1 1.7 - 2.4 mg/dL    Comment: Performed at [facility], [place] [address] [place], [place] [address]  Basic metabolic panel     Status: Abnormal   Collection Time: 04/28/21  5:21 AM  Result Value Ref Range   Sodium 130 (L) 135 - 145 mmol/L   Potassium 2.9 (L) 3.5 - 5.1 mmol/L    Comment: DELTA CHECK NOTED   Chloride 99 98 - 111 mmol/L   CO2 24 22 - 32 mmol/L   Glucose, Bld 94 70 - 99 mg/dL    Comment: Glucose reference range applies only to samples taken after fasting for at least 8 hours.   BUN 26 (H) 8 - 23 mg/dL   Creatinine, Ser 1.48 (H) 0.44 - 1.00 mg/dL   Calcium 8.9 8.9 - 10.3 mg/dL   GFR, Estimated 36 (L) >60 mL/min    Comment: (NOTE) Calculated using the CKD-EPI Creatinine Equation (2021)    Anion gap 7 5 - 15    Comment: Performed at [facility],  [place] [address], [place], [place] [address]  CBC     Status: Abnormal   Collection Time: 04/28/21  7:21 AM  Result Value Ref Range   WBC 8.8 4.0 - 10.5 K/uL   RBC 2.68 (L) 3.87 - 5.11 MIL/uL   Hemoglobin 8.7 (L) 12.0 - 15.0 g/dL   HCT 25.7 (L) 36.0 - 46.0 %   MCV 95.9 80.0 - 100.0 fL   MCH 32.5 26.0 - 34.0 pg   MCHC 33.9 30.0 - 36.0 g/dL   RDW 12.0 11.5 - 15.5 %   Platelets 198 150 - 400 K/uL   nRBC 0.0 0.0 - 0.2 %    Comment: Performed at [facility], [place] [address], [place], [place] [address]  CBC     Status: Abnormal   Collection Time: 04/28/21 11:09 AM  Result Value Ref Range   WBC 9.6 4.0 - 10.5 K/uL   RBC 2.81 (L) 3.87 - 5.11 MIL/uL   Hemoglobin 8.9 (L) 12.0 - 15.0 g/dL   HCT 27.1 (L) 36.0 - 46.0 %   MCV 96.4 80.0 - 100.0 fL   MCH 31.7 26.0 - 34.0 pg   MCHC 32.8 30.0 - 36.0 g/dL   RDW 11.9 11.5 - 15.5 %   Platelets 226 150 -  400 K/uL   nRBC 0.0 0.0 - 0.2 %    Comment: Performed at [facility], [place] [address], [place], [place] [address]  Type and screen [place]     Status: None   Collection Time: 04/28/21 11:11 AM  Result Value Ref Range   ABO/RH(D) A NEG    Antibody Screen NEG    Sample Expiration 05/01/2021,2359    Unit Number [id number]    Blood Component Type RED CELLS,LR    Unit division 00    Status of Unit ISSUED,FINAL    Transfusion Status OK TO TRANSFUSE    Crossmatch Result      Compatible Performed at [facility], [place] [address] [place], [place] [address]   Prepare RBC (crossmatch)     Status: None   Collection Time: 04/28/21 11:11 AM  Result Value Ref Range   Order Confirmation      ORDER PROCESSED BY BLOOD BANK Performed at [place] [address], [place], [place] [address]   CBC     Status: Abnormal   Collection Time: 04/28/21 11:11 AM  Result Value Ref Range   WBC 9.5 4.0 - 10.5 K/uL   RBC 2.80 (L) 3.87 - 5.11 MIL/uL   Hemoglobin  8.8 (L) 12.0 - 15.0 g/dL   HCT 26.8 (L) 36.0 - 46.0 %   MCV 95.7 80.0 - 100.0 fL   MCH 31.4 26.0 - 34.0 pg   MCHC 32.8 30.0 - 36.0 g/dL   RDW 11.9 11.5 - 15.5 %   Platelets 212 150 - 400 K/uL   nRBC 0.0 0.0 - 0.2 %    Comment: Performed at [facility], [place] [address], [place], [place] [address]  Hemoglobin and hematocrit, blood     Status: Abnormal   Collection Time: 04/28/21  9:33 PM  Result Value Ref Range   Hemoglobin 10.1 (L) 12.0 - 15.0 g/dL   HCT 30.9 (L) 36.0 - 46.0 %    Comment: Performed at [facility], [place] [address] [place], [place] [address]  CBC     Status: Abnormal   Collection Time: 04/29/21  5:03 AM  Result Value Ref Range   WBC 9.9 4.0 - 10.5 K/uL   RBC 3.33 (L) 3.87 - 5.11 MIL/uL   Hemoglobin 10.4 (L) 12.0 - 15.0 g/dL   HCT 31.6 (L) 36.0 - 46.0 %   MCV 94.9 80.0 - 100.0 fL   MCH 31.2 26.0 - 34.0 pg   MCHC 32.9 30.0 - 36.0 g/dL   RDW 12.6 11.5 - 15.5 %   Platelets 249 150 - 400 K/uL   nRBC 0.0 0.0 - 0.2 %    Comment: Performed at [facility], [place] [address] [place],  [address]  Comprehensive metabolic panel     Status: Abnormal   Collection Time: 04/29/21  5:03 AM  Result Value Ref Range   Sodium 132 (L) 135 - 145 mmol/L   Potassium 3.8 3.5 - 5.1 mmol/L    Comment: DELTA CHECK NOTED NO VISIBLE HEMOLYSIS    Chloride 101 98 - 111 mmol/L   CO2 22 22 - 32 mmol/L   Glucose, Bld 94 70 - 99 mg/dL    Comment: Glucose reference range applies only to samples taken after fasting for at least 8 hours.   BUN 21 8 - 23 mg/dL   Creatinine, Ser 1.17 (H) 0.44 - 1.00 mg/dL   Calcium 9.2 8.9 -  10.3 mg/dL   Total Protein 6.6 6.5 - 8.1 g/dL   Albumin 4.0 3.5 - 5.0 g/dL   AST 17 15 - 41 U/L   ALT 13 0 - 44 U/L   Alkaline Phosphatase 82 38 - 126 U/L   Total Bilirubin 1.3 (H) 0.3 - 1.2 mg/dL   GFR, Estimated 47 (L) >60 mL/min    Comment: (NOTE) Calculated using the CKD-EPI Creatinine Equation (2021)     Anion gap 9 5 - 15    Comment: Performed at [facility], [place] [address], [place], [place] [address]    Radiology/Results: CT ABDOMEN PELVIS WO CONTRAST  Result Date: 04/27/2021 CLINICAL DATA:  Postop.  Abdominal pain.  Nausea vomiting. EXAM: CT ABDOMEN AND PELVIS WITHOUT CONTRAST TECHNIQUE: Multidetector CT imaging of the abdomen and pelvis was performed following the standard protocol without IV contrast. COMPARISON:  04/19/2021. FINDINGS: Lower chest: Moderate left and small to moderate right pleural effusions. Left greater than right lower lobe opacities consistent with atelectasis. Scattered linear and reticular opacities elsewhere at the lung bases, also likely atelectasis. Pleural effusions have increased in size from the prior CT. Hepatobiliary: Lateral segment of the left lobe appears larger than on the recent prior exam, particularly along its inferior margin. There is a vague area of relative hypoattenuation across the inferior aspect, segment 3. Remainder of the liver is unremarkable. Normal gallbladder. No bile duct dilation. Pancreas: Unremarkable. No pancreatic ductal dilatation or surrounding inflammatory changes. Spleen: Normal in size without focal abnormality. Adrenals/Urinary Tract: Adrenal glands are unremarkable. Kidneys are normal, without renal calculi, focal lesion, or hydronephrosis. Bladder is unremarkable. Stomach/Bowel: Minimal hiatal hernia. Stomach mostly decompressed. There is haziness increased attenuation along the wall of the gastric antrum. Small bowel and colon are normal in caliber. No wall thickening or inflammation. Vascular/Lymphatic: Aortic atherosclerosis. No aneurysm. No enlarged lymph nodes. Reproductive: Status post hysterectomy. No adnexal masses. Other: Small amount of hemoperitoneum primarily collecting in the pelvis.a additional small areas of hyperattenuation lie underneath the left hemidiaphragm suspected to be additional hemorrhage,  abutting the lateral segment the left liver lobe. Musculoskeletal: No fracture or acute finding. IMPRESSION: 1. Suspected hemorrhage from the lateral segment of the left liver lobe. Lateral segment now larger than it was on the previous CT with an area of hypoattenuation in segment 3, which may reflect a contusion/laceration. The round lesion noted in segment 2 on the prior CT is not defined. There is apparent hemorrhage adjacent to the lateral segment, underneath the left hemidiaphragm and adjacent to the stomach. Definitive hemoperitoneum, small in amount, is seen in the pelvis. 2. No other acute abnormality within the abdomen or pelvis. 3. Moderate left and small right pleural effusion with associated lung base dependent opacity consistent with atelectasis. Pleural effusions have increased in size from prior CT. Electronically Signed   By: Lajean Manes M.D.   On: 04/27/2021 15:56    Anti-infectives: Anti-infectives (From admission, onward)    Start     Dose/Rate Route Frequency Ordered Stop   04/29/21 1000  vancomycin (VANCOCIN) capsule 125 mg        125 mg Oral 4 times daily 04/29/21 0815     04/28/21 1430  metroNIDAZOLE (FLAGYL) tablet 500 mg        500 mg Oral Every 8 hours 04/28/21 1358     04/25/21 1245  vancomycin (VANCOCIN) 50 mg/mL oral solution SOLN 125 mg  Status:  Discontinued        125 mg Oral Every  6 hours 04/25/21 1145 04/29/21 0815   04/20/21 1500  vancomycin (VANCOCIN) capsule 125 mg  Status:  Discontinued        125 mg Oral 4 times daily 04/20/21 1405 04/25/21 1145       Assessment/Plan: Problem List: Patient Active Problem List   Diagnosis Date Noted   Clostridium difficile colitis    Intractable nausea and vomiting 04/19/2021   Left facial pain 11/15/2020   Chronic pansinusitis 11/12/2020   Primary insomnia 10/17/2020   Osteopenia of neck of left femur 03/26/2020   Preventative health care 03/26/2020   Closed fracture of shaft of right fibula with routine healing  02/02/2020   Hyponatremia 01/23/2020   Urinary frequency 01/23/2020   Dysphagia 01/23/2020   Acute pansinusitis 01/08/2018   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 06/02/2017 05/28/2017   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 05/21/2017   Right ear pain 05/19/2017   Dizziness 05/19/2017   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 03/24/2017   Chronic diastolic CHF (congestive heart failure) ([place]) 08/05/2016   Chest discomfort 06/21/2016   Nausea and vomiting    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   Small bowel obstruction (HCC)    Ileus (HCC)    Generalized anxiety disorder 02/14/2016   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   Hypokalemia    Fever    Crohn's disease of colon with complication ([place])    Other depression due to general medical condition    Difficulty in walking, not elsewhere classified    Acute blood loss anemia    Absolute anemia    Left upper quadrant pain    Abdominal pain 01/07/2016   GI bleed 01/07/2016   IBS (irritable bowel syndrome)    Tenesmus    Ulcerative pancolitis with rectal bleeding ([place])    Diarrhea 12/24/2015   Crohn disease ([place]) 12/24/2015   Dehydration    Ulcerative pancolitis with abscess (HCC)    Rectal pain    Leucocytosis    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  Hg is stable this am.  No indications for surgery.  We will be glad to see again if needed.   * No surgery found *    LOS: 10 days   Matt B. Hassell Done, MD, [facility] [phone] to reach the surgeon on call.    04/29/2021 9:42 AM

## 2021-04-29 NOTE — Progress Notes (Signed)
Physical Therapy Treatment Patient Details Name: Sara Barnes MRN: [id number] DOB: Nov 24, 1940 Today's Date: 04/29/2021   History of Present Illness Patient is a 81 year old female who presented with nausea, vomitting and diarhea. Patient was admitted with intractable nausea and vomitting, C diff and dyspnea. Pt is s/p liver biopsy on 12/28 and CT abdomen shows post liver biopsy bleed which is causing her chest discomfort.  PMH: crohn's disease, R hemicolectomy, SBO, ileostomy revision.    PT Comments    Pt sitting on BSC on arrival and agreeable to ambulate as tolerated prior to returning to bed.  Pt's SpO2 93% on room air during ambulation and RN notified.    Recommendations for follow up therapy are one component of a multi-disciplinary discharge planning process, led by the attending physician.  Recommendations may be updated based on patient status, additional functional criteria and insurance authorization.  Follow Up Recommendations  Home health PT     Assistance Recommended at Discharge Intermittent Supervision/Assistance  Equipment Recommendations  None recommended by PT    Recommendations for Other Services       Precautions / Restrictions Precautions Precautions: Fall     Mobility  Bed Mobility Overal bed mobility: Modified Independent                  Transfers Overall transfer level: Needs assistance Equipment used: None Transfers: Sit to/from Stand Sit to Stand: Supervision           General transfer comment: appears to have been using [facility] with sister present on arrival    Ambulation/Gait Ambulation/Gait assistance: Min guard Gait Distance (Feet): 80 Feet Assistive device: Rolling walker (2 wheels) Gait Pattern/deviations: Step-through pattern;Trunk flexed;Decreased stride length       General Gait Details: verbal cues for RW positioning, SPO2 93% on room air, pt and sister reports pt has been removing O2 [place] to use BSC, respiratory therapist  in end of session to give breathing tx and RN notified of sats with ambulation   Stairs             Wheelchair Mobility    Modified Rankin (Stroke Patients Only)       Balance                                            Cognition Arousal/Alertness: Awake/alert Behavior During Therapy: Flat affect Overall Cognitive Status: Within Functional Limits for tasks assessed                                          Exercises      General Comments        Pertinent Vitals/Pain Pain Assessment: 0-10 Pain Score: 7  Pain Location: everywhere with movement Pain Descriptors / Indicators: Sore Pain Intervention(s): Repositioned;Monitored during session;Patient requesting pain meds-RN notified    Home Living                          Prior Function            PT Goals (current goals can now be found in the care plan section) Progress towards PT goals: Progressing toward goals    Frequency    Min 3X/week      PT Plan Current  plan remains appropriate    Co-evaluation              AM-PAC PT "6 Clicks" Mobility   Outcome Measure  Help needed turning from your back to your side while in a flat bed without using bedrails?: None Help needed moving from lying on your back to sitting on the side of a flat bed without using bedrails?: None Help needed moving to and from a bed to a chair (including a wheelchair)?: A Little Help needed standing up from a chair using your arms (e.g., wheelchair or bedside chair)?: A Little Help needed to walk in hospital room?: A Little Help needed climbing 3-5 steps with a railing? : A Little 6 Click Score: 20    End of Session Equipment Utilized During Treatment: Gait belt Activity Tolerance: Patient limited by fatigue Patient left: in bed;with call bell/phone within reach;with family/visitor present Nurse Communication: Mobility status PT Visit Diagnosis: Difficulty in walking, not  elsewhere classified (R26.2)     Time: 5110-2111 PT Time Calculation (min) (ACUTE ONLY): 12 min  Charges:  $Gait Training: 8-22 mins                    Arlyce Dice, DPT Acute Rehabilitation Services Pager: [phone] Office: [place] 04/29/2021, 4:34 PM

## 2021-04-29 NOTE — Progress Notes (Signed)
PROGRESS NOTE    CARSYN BOSTER  GGE:[id number] DOB: 1940/08/24 DOA: 04/19/2021 PCP: Ann Held, DO   Brief Narrative: The patient is a 81 year old Caucasian female with a past medical history significant for Crohn's disease, depression, insomnia, dyspepsia admitted with C. difficile colitis with complaints of nausea vomiting and diarrhea.  She is status post biopsy of the liver.   Assessment & Plan:   Principal Problem:   Intractable nausea and vomiting Active Problems:   Depression   Diarrhea   Crohn disease (HCC)   Hyponatremia   Clostridium difficile colitis   #1 C. difficile colitis patient presented with nausea vomiting and diarrhea  Vancomycin changed to oral tablets or capsules instead of the liquid form.  Patient refuses to take liquid vancomycin thinking that is increasing her diarrhea.      #2 chest discomfort and shortness of breath - CT abdomen shows post liver biopsy bleed which is causing her chest discomfort.  I will increase her Dilaudid to 1 mg every 3 as needed.   Follow-up chest x-ray today pending.   Chest x-ray revealed new small left effusion and right pleural effusion.  EKG was in A. fib RVR she was given Lopressor 5 mg IV and was continued on metoprolol twice daily initially.  Metoprolol was stopped with good rate control since Lasix had to be started (bilateral pleural effusion)as her blood pressure was soft.  Will monitor closely. she is back to sinus rhythm and rate controlled.  Her troponin was mildly elevated at 27 which is likely due to demand ischemia.  Was also given a dose of Lasix with improvement in her breathing. Echo ejection fraction 60 to 65% with normal left ventricular function no regional wall motion abnormalities. Normal TSH. Troponin 27.  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  Now with CT of the abdomen showing new hepatic lesions suspicious for metastatic cancer.  She is status  post biopsy on 04/24/2021.  #4 hypokalemia resolved #5 history of anxiety and depression continue home meds  #6 hyponatremia likely multifactorial sodium improved with normal saline to 132  However she was volume overloaded with third spacing and hypoalbuminemia.  IV fluids were then stopped.  Monitor sodium daily.    #7 AKI creatinine 1.17 from 1.48 from 1.77 from 2.28.  Renal ultrasound shows no evidence of hydronephrosis.    #8 insomnia patient has been taking Ambien 10 mg at bedtime for many number of years and she claims that she cannot sleep without it.  #9 acute anemia hemoglobin with significant drop from 14-8 with CT evidence of hemoperitoneum and liver laceration since liver biopsy.  Will monitor closely give her 1 unit of blood transfusion today DC Lovenox Consulted general surgery appreciate their input DW IR  Nutrition Problem: Inadequate oral intake Etiology: acute illness  Signs/Symptoms: per patient/family report  Interventions: Boost Breeze, Prostat  Estimated body mass index is 25.66 kg/m as calculated from the following:   Height as of this encounter: 5' 6"  (1.676 m).   Weight as of this encounter: 72.1 kg.  DVT prophylaxis: none due to bleed   code Status: Full code Family Communication: None at bedside today.  Discussed with sister on the phone yesterday.   Disposition Plan:  Status is: Inpatient  Remains inpatient appropriate because: Needs IV fluids   Consultants:  Oncology interventional radiology  Procedures: Biopsy of the liver lesion Antimicrobials: P.o. vancomycin  Subjective: Patient complains of ongoing  severe pain to her chest and abdomen and shortness of breath  She was able to speak to me in full sentences She is very concerned about the liquid oral vancomycin which she thinks is causing her to have more diarrhea and she is refusing the liquid vancomycin and asking for the pill form of vancomycin which has helped her in the  past. Objective: Vitals:   04/28/21 1823 04/28/21 2013 04/29/21 0341 04/29/21 1328  BP: (!) 156/71 (!) 156/83 (!) 166/89 135/65  Pulse: 69 78 89 76  Resp: 20   16  Temp: 97.6 F (36.4 C) 98.1 F (36.7 C) 98.4 F (36.9 C) (!) 97.5 F (36.4 C)  TempSrc: Oral Oral Oral Oral  SpO2: 97% 96% 100% 100%  Weight:      Height:        Intake/Output Summary (Last 24 hours) at 04/29/2021 1435 Last data filed at 04/28/2021 2000 Gross per 24 hour  Intake 333 ml  Output 500 ml  Net -167 ml    Filed Weights   04/19/21 2130  Weight: 72.1 kg    Examination:  General exam: Appears chronically ill looking frail elderly female Respiratory system: Diminished breath sounds at the bases to auscultation. Respiratory effort normal. Cardiovascular system: S1 & S2 heard, RRR. No JVD, murmurs, rubs, gallops or clicks. No pedal edema. Gastrointestinal system: Abdomen is nondistended, soft and nontender. No organomegaly or masses felt. Normal bowel sounds heard. Central nervous system: Alert and oriented. No focal neurological deficits. Extremities: Symmetric 5 x 5 power. Skin: No rashes, lesions or ulcers Psychiatry: Judgement and insight appear normal. Mood & affect appropriate.     Data Reviewed: I have personally reviewed following labs and imaging studies  CBC: Recent Labs  Lab 04/23/21 0415 04/24/21 0432 04/28/21 0721 04/28/21 1109 04/28/21 1111 04/28/21 2133 04/29/21 0503  WBC 7.6 9.0 8.8 9.6 9.5  --  9.9  NEUTROABS 5.4 5.9  --   --   --   --   --   HGB 13.4 14.9 8.7* 8.9* 8.8* 10.1* 10.4*  HCT 40.2 43.7 25.7* 27.1* 26.8* 30.9* 31.6*  MCV 93.1 92.4 95.9 96.4 95.7  --  94.9  PLT 229 288 198 226 212  --  767    Basic Metabolic Panel: Recent Labs  Lab 04/23/21 0415 04/24/21 0432 04/26/21 0435 04/26/21 1647 04/27/21 0530 04/28/21 0517 04/28/21 0521 04/29/21 0503  NA 133* 132* 129* 126* 133*  --  130* 132*  K 3.6 3.5 4.2 3.5 3.5  --  2.9* 3.8  CL 99 98 94* 98 98  --  99 101   CO2 28 24 24  20* 24  --  24 22  GLUCOSE 96 102* 120* 121* 95  --  94 94  BUN 9 11 28* 31* 29*  --  26* 21  CREATININE 0.93 0.97 2.28* 2.15* 1.77*  --  1.48* 1.17*  CALCIUM 8.6* 9.1 8.7* 8.2* 9.0  --  8.9 9.2  MG 2.0 2.0  --   --   --  2.1  --   --   PHOS 4.8* 4.5  --   --   --   --   --   --     GFR: Estimated Creatinine Clearance: 39.6 mL/min (A) (by C-G formula based on SCr of 1.17 mg/dL (H)). Liver Function Tests: Recent Labs  Lab 04/23/21 0415 04/24/21 0432 04/29/21 0503  AST 18 22 17   ALT 14 19 13   ALKPHOS 67 80 82  BILITOT 0.4  0.6 1.3*  PROT 6.2* 7.2 6.6  ALBUMIN 3.4* 3.7 4.0    No results for input(s): LIPASE, AMYLASE in the last 168 hours. No results for input(s): AMMONIA in the last 168 hours. Coagulation Profile: Recent Labs  Lab 04/23/21 0415  INR 1.0    Cardiac Enzymes: No results for input(s): CKTOTAL, CKMB, CKMBINDEX, TROPONINI in the last 168 hours. BNP (last 3 results) No results for input(s): PROBNP in the last 8760 hours. HbA1C: No results for input(s): HGBA1C in the last 72 hours. CBG: No results for input(s): GLUCAP in the last 168 hours. Lipid Profile: No results for input(s): CHOL, HDL, LDLCALC, TRIG, CHOLHDL, LDLDIRECT in the last 72 hours. Thyroid Function Tests: Recent Labs    04/26/21 1856  TSH 1.286    Anemia Panel: No results for input(s): VITAMINB12, FOLATE, FERRITIN, TIBC, IRON, RETICCTPCT in the last 72 hours. Sepsis Labs: No results for input(s): PROCALCITON, LATICACIDVEN in the last 168 hours.  Recent Results (from the past 240 hour(s))  Gastrointestinal Panel by PCR , Stool     Status: None   Collection Time: 04/20/21 10:01 AM   Specimen: STOOL  Result Value Ref Range Status   Campylobacter species NOT DETECTED NOT DETECTED Final   Plesimonas shigelloides NOT DETECTED NOT DETECTED Final   Salmonella species NOT DETECTED NOT DETECTED Final   Yersinia enterocolitica NOT DETECTED NOT DETECTED Final   Vibrio species NOT  DETECTED NOT DETECTED Final   Vibrio cholerae NOT DETECTED NOT DETECTED Final   Enteroaggregative E coli (EAEC) NOT DETECTED NOT DETECTED Final   Enteropathogenic E coli (EPEC) NOT DETECTED NOT DETECTED Final   Enterotoxigenic E coli (ETEC) NOT DETECTED NOT DETECTED Final   Shiga like toxin producing E coli (STEC) NOT DETECTED NOT DETECTED Final   Shigella/Enteroinvasive E coli (EIEC) NOT DETECTED NOT DETECTED Final   Cryptosporidium NOT DETECTED NOT DETECTED Final   Cyclospora cayetanensis NOT DETECTED NOT DETECTED Final   Entamoeba histolytica NOT DETECTED NOT DETECTED Final   Giardia lamblia NOT DETECTED NOT DETECTED Final   Adenovirus F40/41 NOT DETECTED NOT DETECTED Final   Astrovirus NOT DETECTED NOT DETECTED Final   Norovirus GI/GII NOT DETECTED NOT DETECTED Final   Rotavirus A NOT DETECTED NOT DETECTED Final   Sapovirus (I, II, IV, and V) NOT DETECTED NOT DETECTED Final    Comment: Performed at [facility], [address] [place], [place] [address]  C Difficile Quick Screen w PCR reflex     Status: Abnormal   Collection Time: 04/20/21 10:01 AM   Specimen: STOOL  Result Value Ref Range Status   C Diff antigen POSITIVE (A) NEGATIVE Final   C Diff toxin NEGATIVE NEGATIVE Final   C Diff interpretation Results are indeterminate. See PCR results.  Final    Comment: Performed at [facility], [place] [address] [place], [place] [address]  C. Diff by PCR, Reflexed     Status: Abnormal   Collection Time: 04/20/21 10:01 AM  Result Value Ref Range Status   Toxigenic C. Difficile by PCR POSITIVE (A) NEGATIVE Final    Comment: Positive for toxigenic C. difficile with little to no toxin production. Only treat if clinical presentation suggests symptomatic illness. Performed at [facility], [place] [address], [place], [place] [address]           Radiology Studies: No results found.      Scheduled Meds:  (feeding supplement) PROSource Plus  30 mL  Oral BID BM  feeding supplement  1 Container Oral TID BM   FLUoxetine  20 mg Oral Daily   furosemide  40 mg Intravenous Daily   multivitamin with minerals  1 tablet Oral Daily   predniSONE  5 mg Oral Q breakfast   vancomycin  125 mg Oral QID   Continuous Infusions:  famotidine (PEPCID) IV 20 mg (04/29/21 1253)   promethazine (PHENERGAN) injection (IM or IVPB) 12.5 mg (04/29/21 0848)     LOS: 10 days    Time spent: 45 minutes  Georgette Shell, MD  04/29/2021, 2:35 PM

## 2021-04-30 ENCOUNTER — Inpatient Hospital Stay (HOSPITAL_COMMUNITY): Payer: Medicare HMO

## 2021-04-30 ENCOUNTER — Ambulatory Visit: Payer: Medicare HMO | Admitting: Family Medicine

## 2021-04-30 DIAGNOSIS — R112 Nausea with vomiting, unspecified: Secondary | ICD-10-CM | POA: Diagnosis not present

## 2021-04-30 LAB — COMPREHENSIVE METABOLIC PANEL
ALT: 15 U/L (ref 0–44)
AST: 18 U/L (ref 15–41)
Albumin: 3.8 g/dL (ref 3.5–5.0)
Alkaline Phosphatase: 88 U/L (ref 38–126)
Anion gap: 8 (ref 5–15)
BUN: 25 mg/dL — ABNORMAL HIGH (ref 8–23)
CO2: 25 mmol/L (ref 22–32)
Calcium: 9.2 mg/dL (ref 8.9–10.3)
Chloride: 99 mmol/L (ref 98–111)
Creatinine, Ser: 1.24 mg/dL — ABNORMAL HIGH (ref 0.44–1.00)
GFR, Estimated: 44 mL/min — ABNORMAL LOW (ref >60)
Glucose, Bld: 98 mg/dL (ref 70–99)
Potassium: 3.6 mmol/L (ref 3.5–5.1)
Sodium: 132 mmol/L — ABNORMAL LOW (ref 135–145)
Total Bilirubin: 1.1 mg/dL (ref 0.3–1.2)
Total Protein: 6.6 g/dL (ref 6.5–8.1)

## 2021-04-30 LAB — CBC
HCT: 35.4 % — ABNORMAL LOW (ref 36.0–46.0)
Hemoglobin: 11.8 g/dL — ABNORMAL LOW (ref 12.0–15.0)
MCH: 31.3 pg (ref 26.0–34.0)
MCHC: 33.3 g/dL (ref 30.0–36.0)
MCV: 93.9 fL (ref 80.0–100.0)
Platelets: 321 10*3/uL (ref 150–400)
RBC: 3.77 MIL/uL — ABNORMAL LOW (ref 3.87–5.11)
RDW: 12.5 % (ref 11.5–15.5)
WBC: 10 10*3/uL (ref 4.0–10.5)
nRBC: 0 % (ref 0.0–0.2)

## 2021-04-30 NOTE — Progress Notes (Signed)
Chaplain provided prayer to Sara Barnes and her sister.  Thy wanted to uplift her health and the results of a test she has been waiting on.  Chaplain affirmed how hard it can be to wait and not know what the next steps will be.  Chaplain worked to provide comfort, listening and presence.  Sara Barnes and her sister are about ten years apart in age.  Sara Barnes described her sister, who has been with her everyday, as being a source of joy for their family.  Family is very important to them both.  Chaplain affirmed sister's presence at her bedside.  Chaplain will follow-up.    04/30/21 1600  Clinical Encounter Type  Visited With Patient and family together  Visit Type Initial;Spiritual support  Referral From Patient  Consult/Referral To Chaplain  Spiritual Encounters  Spiritual Needs Prayer  Stress Factors  Patient Stress Factors Health changes

## 2021-04-30 NOTE — Progress Notes (Signed)
°   04/30/21 1400  Mobility  Activity Ambulated in hall  Level of Assistance Standby assist, set-up cues, supervision of patient - no hands on  Assistive Device Front wheel walker  Distance Ambulated (ft) 350 ft  Mobility Ambulated with assistance in hallway  Mobility Response Tolerated well  Mobility performed by Mobility specialist  $Mobility charge 1 Mobility   Pt agreeable to mobilize this afternoon. Ambulated about 391f in hall with RW, tolerated well. She noted some chest pain while exerting, but states this is "normal" for her "in this condition". [facility] RN notified of session.   KHopeSpecialist Acute Rehab Services Office: [phone]

## 2021-04-30 NOTE — Progress Notes (Addendum)
Referring Physician(s): Toth,P  Supervising Physician: Markus Daft  Patient Status:  [facility] - In-pt  Chief Complaint:  Liver lesion s/p biopsy 04/24/21 with post-procedure hematoma and anemia   Subjective: Pt sitting on bedside commode; still has some abd soreness, occ nausea/loose stools, some dyspnea with exertion, HR 76, afebrile   Allergies: Nsaids, Codeine, Mesalamine, Oxycodone, Lactose intolerance (gi), Norco [hydrocodone-acetaminophen], Other, and Sulfa antibiotics  Medications: Prior to Admission medications   Medication Sig Start Date End Date Taking? Authorizing Provider  acetaminophen (TYLENOL) 500 MG tablet You can take 2 tablets every 6 hours as needed for pain.  Use this as your primary pain control.  Use Tramadol as a last resort.  You can buy this over the counter at any drug store.    DO NOT TAKE MORE THAN 4000 MG OF TYLENOL PER DAY.  IT CAN HARM YOUR LIVER. 03/31/17  Yes Earnstine Regal, PA-C  cyanocobalamin (,VITAMIN B-12,) 1000 MCG/ML injection As directed Patient taking differently: 1,000 mcg every 30 (thirty) days. First Sunday of each month 01/16/21  Yes Pyrtle, Lajuan Lines, MD  diphenoxylate-atropine (LOMOTIL) 2.5-0.025 MG tablet Take 1-2 tablets by mouth 3-4 times daily AS NEEDED for severe diarrhea 01/16/21  Yes Pyrtle, Lajuan Lines, MD  famotidine (PEPCID) 20 MG tablet Take 1 tablet (20 mg total) by mouth 2 (two) times daily. Patient taking differently: Take 20 mg by mouth daily as needed for indigestion. 01/16/21  Yes Pyrtle, Lajuan Lines, MD  FLUoxetine (PROZAC) 20 MG capsule Take 1 capsule (20 mg total) by mouth daily. 01/16/21  Yes Pyrtle, Lajuan Lines, MD  fluticasone (FLONASE) 50 MCG/ACT nasal spray Place 2 sprays into both nostrils daily. Patient taking differently: Place 2 sprays into both nostrils daily as needed for rhinitis. 11/12/20  Yes Roma Schanz R, DO  gabapentin (NEURONTIN) 100 MG capsule Take 3 capsules (300 mg total) by mouth 3 (three) times daily. Patient  taking differently: Take 300 mg by mouth at bedtime as needed (for neuropathy). 11/15/20  Yes Marcial Pacas, MD  loperamide (IMODIUM) 2 MG capsule Take 1 capsule (2 mg total) by mouth as needed for diarrhea or loose stools. 11/02/19  Yes Pyrtle, Lajuan Lines, MD  ondansetron (ZOFRAN-ODT) 4 MG disintegrating tablet DISSOLVE ONE TABLET BY MOUTH EVERY 6 HOURS AS NEEDED FOR NAUSEA AND VOMITING 01/16/21  Yes Pyrtle, Lajuan Lines, MD  predniSONE (DELTASONE) 5 MG tablet TAKE 1 TABLET(5 MG) BY MOUTH DAILY 01/16/21  Yes Pyrtle, Lajuan Lines, MD  zolpidem (AMBIEN) 10 MG tablet Take 1 tablet (10 mg total) by mouth at bedtime as needed for sleep. 04/15/21 07/14/21 Yes Pyrtle, Lajuan Lines, MD     Vital Signs: BP 122/75 (BP Location: Right Arm)    Pulse 76    Temp (!) 97.4 F (36.3 C) (Oral)    Resp 16    Ht _0  (1.676 m)    Wt 158 lb 15.2 oz (72.1 kg)    SpO2 94%    BMI 25.66 kg/m   Physical Exam awake/answers questions ok, puncture site subxiphoid region clean and dry, mildly tender to palpation  Imaging: CT ABDOMEN PELVIS WO CONTRAST  Result Date: 04/27/2021 CLINICAL DATA:  Postop.  Abdominal pain.  Nausea vomiting. EXAM: CT ABDOMEN AND PELVIS WITHOUT CONTRAST TECHNIQUE: Multidetector CT imaging of the abdomen and pelvis was performed following the standard protocol without IV contrast. COMPARISON:  04/19/2021. FINDINGS: Lower chest: Moderate left and small to moderate right pleural effusions. Left greater than right lower lobe opacities consistent  with atelectasis. Scattered linear and reticular opacities elsewhere at the lung bases, also likely atelectasis. Pleural effusions have increased in size from the prior CT. Hepatobiliary: Lateral segment of the left lobe appears larger than on the recent prior exam, particularly along its inferior margin. There is a vague area of relative hypoattenuation across the inferior aspect, segment 3. Remainder of the liver is unremarkable. Normal gallbladder. No bile duct dilation. Pancreas: Unremarkable.  No pancreatic ductal dilatation or surrounding inflammatory changes. Spleen: Normal in size without focal abnormality. Adrenals/Urinary Tract: Adrenal glands are unremarkable. Kidneys are normal, without renal calculi, focal lesion, or hydronephrosis. Bladder is unremarkable. Stomach/Bowel: Minimal hiatal hernia. Stomach mostly decompressed. There is haziness increased attenuation along the wall of the gastric antrum. Small bowel and colon are normal in caliber. No wall thickening or inflammation. Vascular/Lymphatic: Aortic atherosclerosis. No aneurysm. No enlarged lymph nodes. Reproductive: Status post hysterectomy. No adnexal masses. Other: Small amount of hemoperitoneum primarily collecting in the pelvis.a additional small areas of hyperattenuation lie underneath the left hemidiaphragm suspected to be additional hemorrhage, abutting the lateral segment the left liver lobe. Musculoskeletal: No fracture or acute finding. IMPRESSION: 1. Suspected hemorrhage from the lateral segment of the left liver lobe. Lateral segment now larger than it was on the previous CT with an area of hypoattenuation in segment 3, which may reflect a contusion/laceration. The round lesion noted in segment 2 on the prior CT is not defined. There is apparent hemorrhage adjacent to the lateral segment, underneath the left hemidiaphragm and adjacent to the stomach. Definitive hemoperitoneum, small in amount, is seen in the pelvis. 2. No other acute abnormality within the abdomen or pelvis. 3. Moderate left and small right pleural effusion with associated lung base dependent opacity consistent with atelectasis. Pleural effusions have increased in size from prior CT. Electronically Signed   By: Lajean Manes M.D.   On: 04/27/2021 15:56   DG Chest 1 View  Result Date: 04/29/2021 CLINICAL DATA:  chest discomfort and shortness of breath EXAM: CHEST  1 VIEW COMPARISON:  Chest x-ray 04/26/2021, CT chest 04/15/2012 FINDINGS: The heart and  mediastinal contours are unchanged. Aortic calcification. Prominent hilar vasculature. Biapical pleural/pulmonary scarring. Hazy airspace opacities of bilateral mid to lower lung zones. Slightly increased interstitial markings. At least small to moderate volume left and trace to small volume right pleural effusions. No pneumothorax. No acute osseous abnormality. IMPRESSION: 1. Hazy airspace opacities of bilateral mid to lower lung zones. At least small to moderate volume left and trace to small volume right pleural effusions. 2. Pulmonary venous congestion. 3.  Aortic Atherosclerosis (ICD10-I70.0). Electronically Signed   By: Iven Finn M.D.   On: 04/29/2021 16:37   DG Chest 1 View  Result Date: 04/26/2021 CLINICAL DATA:  Shortness of breath. EXAM: CHEST  1 VIEW COMPARISON:  Chest x-ray 03/24/2021 FINDINGS: Moderate size left pleural effusion is unchanged. There is a new small right pleural effusion. There are minimal bibasilar opacities. There is no pneumothorax. Cardiomediastinal silhouette is unchanged, the heart is mildly enlarged. Surgical clips are seen along the left chest wall. No acute fractures are identified. IMPRESSION: 1. New small right pleural effusion. 2. Stable moderate left pleural effusion. 3. Minimal bibasilar atelectasis/airspace disease. Electronically Signed   By: Ronney Asters M.D.   On: 04/26/2021 15:45   US RENAL  Result Date: 04/26/2021 CLINICAL DATA:  Elevated BUN and creatinine.  Short of breath. EXAM: RENAL / URINARY TRACT ULTRASOUND COMPLETE COMPARISON:  CT, 04/19/2021. FINDINGS: Right Kidney: Renal measurements: 11  x 1 x 4.2 x 5.3 cm = volume: 128.7 mL. Echogenicity within normal limits. No mass or hydronephrosis visualized. Left Kidney: Renal measurements: 11.0 x 4.5 x 4.8 cm = volume: 124 mL. Echogenicity within normal limits. No mass or hydronephrosis visualized. Bladder: Unremarkable. Hypo to anechoic areas adjacent to the bladder matter nonspecific, possibly small  amounts of peritoneal fluid versus fluid-filled bowel. Other: None. IMPRESSION: 1. Normal sonographic appearance of the kidneys. Electronically Signed   By: Lajean Manes M.D.   On: 04/26/2021 16:35   ECHOCARDIOGRAM COMPLETE  Result Date: 04/27/2021    ECHOCARDIOGRAM REPORT   Patient Name:   BURNIS KASER Date of Exam: 04/27/2021 Medical Rec #:  [id number]      Height:       66.0 in Accession #:    [id number]     Weight:       159.0 lb Date of Birth:  Jun 22, 1940       BSA:          1.814 m Patient Age:    46 years       BP:           121/64 mmHg Patient Gender: F              HR:           87 bpm. Exam Location:  Inpatient Procedure: 2D Echo, Cardiac Doppler and Color Doppler Indications:    Abnormal ECG R94.31  History:        Patient has prior history of Echocardiogram examinations, most                 recent 06/22/2016. Risk Factors:Dyslipidemia. GERD. Past history                 of breast cancer.  Sonographer:    Johny Chess RDCS Referring Phys: [id number] [place]  Sonographer Comments: Image acquisition challenging due to breast implants. Exam ended per patients request. IMPRESSIONS  1. Poor image quality due to breast implants and patient stopped exam before done.  2. Left ventricular ejection fraction, by estimation, is 60 to 65%. The left ventricle has normal function. The left ventricle has no regional wall motion abnormalities. Left ventricular diastolic parameters were normal.  3. Right ventricular systolic function is normal. The right ventricular size is normal. There is normal pulmonary artery systolic pressure.  4. The mitral valve is normal in structure. No evidence of mitral valve regurgitation. No evidence of mitral stenosis.  5. The aortic valve was not well visualized. Aortic valve regurgitation is not visualized. No aortic stenosis is present.  6. The inferior vena cava is normal in size with greater than 50% respiratory variability, suggesting right atrial pressure of 3  mmHg. FINDINGS  Left Ventricle: Left ventricular ejection fraction, by estimation, is 60 to 65%. The left ventricle has normal function. The left ventricle has no regional wall motion abnormalities. The left ventricular internal cavity size was normal in size. There is  no left ventricular hypertrophy. Left ventricular diastolic parameters were normal. Right Ventricle: The right ventricular size is normal. No increase in right ventricular wall thickness. Right ventricular systolic function is normal. There is normal pulmonary artery systolic pressure. The tricuspid regurgitant velocity is 2.28 m/s, and  with an assumed right atrial pressure of 8 mmHg, the estimated right ventricular systolic pressure is 23.3 mmHg. Left Atrium: Left atrial size was normal in size. Right Atrium: Right atrial size was normal in size. Pericardium: There is  no evidence of pericardial effusion. Mitral Valve: The mitral valve is normal in structure. No evidence of mitral valve regurgitation. No evidence of mitral valve stenosis. Tricuspid Valve: The tricuspid valve is normal in structure. Tricuspid valve regurgitation is not demonstrated. No evidence of tricuspid stenosis. Aortic Valve: The aortic valve was not well visualized. Aortic valve regurgitation is not visualized. No aortic stenosis is present. Pulmonic Valve: The pulmonic valve was normal in structure. Pulmonic valve regurgitation is not visualized. No evidence of pulmonic stenosis. Aorta: The aortic root is normal in size and structure. Venous: The inferior vena cava is normal in size with greater than 50% respiratory variability, suggesting right atrial pressure of 3 mmHg. IAS/Shunts: The interatrial septum was not assessed. Additional Comments: Poor image quality due to breast implants and patient stopped exam before done.  LEFT VENTRICLE PLAX 2D LVIDd:         3.70 cm   Diastology LVIDs:         2.30 cm   LV e' medial:    8.03 cm/s LV PW:         1.00 cm   LV E/e' medial:   11.8 LV IVS:        1.10 cm   LV e' lateral:   7.62 cm/s LVOT diam:     1.90 cm   LV E/e' lateral: 12.4 LV SV:         57 LV SV Index:   32 LVOT Area:     2.84 cm  RIGHT VENTRICLE RV S prime:     16.30 cm/s TAPSE (M-mode): 1.6 cm LEFT ATRIUM             Index        RIGHT ATRIUM           Index LA diam:        3.50 cm 1.93 cm/m   RA Area:     12.90 cm LA Vol (A2C):   32.9 ml 18.14 ml/m  RA Volume:   27.80 ml  15.33 ml/m LA Vol (A4C):   29.7 ml 16.37 ml/m LA Biplane Vol: 31.6 ml 17.42 ml/m  AORTIC VALVE LVOT Vmax:   125.00 cm/s LVOT Vmean:  66.600 cm/s LVOT VTI:    0.202 m  AORTA Ao Root diam: 2.90 cm Ao Asc diam:  2.80 cm MITRAL VALVE               TRICUSPID VALVE MV Area (PHT): 3.99 cm    TR Peak grad:   20.8 mmHg MV Decel Time: 190 msec    TR Vmax:        228.00 cm/s MV E velocity: 94.70 cm/s MV A velocity: 90.00 cm/s  SHUNTS MV E/A ratio:  1.05        Systemic VTI:  0.20 m                            Systemic Diam: 1.90 cm Jenkins Rouge MD Electronically signed by Jenkins Rouge MD Signature Date/Time: 04/27/2021/12:08:59 PM    Final     Labs:  CBC: Recent Labs    04/28/21 1109 04/28/21 1111 04/28/21 2133 04/29/21 0503 04/30/21 0436  WBC 9.6 9.5  --  9.9 10.0  HGB 8.9* 8.8* 10.1* 10.4* 11.8*  HCT 27.1* 26.8* 30.9* 31.6* 35.4*  PLT 226 212  --  249 321    COAGS: Recent Labs    04/23/21 0415  INR 1.0  BMP: Recent Labs    04/27/21 0530 04/28/21 0521 04/29/21 0503 04/30/21 0436  NA 133* 130* 132* 132*  K 3.5 2.9* 3.8 3.6  CL 98 99 101 99  CO2 _0 GLUCOSE 95 94 94 98  BUN 29* 26* 21 25*  CALCIUM 9.0 8.9 9.2 9.2  CREATININE 1.77* 1.48* 1.17* 1.24*  GFRNONAA 29* 36* 47* 44*    LIVER FUNCTION TESTS: Recent Labs    04/23/21 0415 04/24/21 0432 04/29/21 0503 04/30/21 0436  BILITOT 0.4 0.6 1.3* 1.1  AST _1 ALT _2 ALKPHOS 67 80 82 88  PROT 6.2* 7.2 6.6 6.6  ALBUMIN 3.4* 3.7 4.0 3.8    Assessment and Plan: Liver lesion s/p biopsy  04/24/21 with post-procedure hematoma and anemia ; hx c diff, met breast cancer, AKI; BP ok, HR ok, hgb 11.8(10.4), WBC nl; creat 1.24(1.17), liver path pending; cont current tx/lab checks for now; check final path; TRH has ordered additional CT for today   Electronically Signed: D. Rowe Robert, PA-C 04/30/2021, 10:29 AM   I spent a total of 15 Minutes at the the patient's bedside AND on the patient's hospital floor or unit, greater than 50% of which was counseling/coordinating care for post liver biopsy hematoma    Patient ID: Sara Barnes, female   DOB: December 05, 1940, 81 y.o.   MRN: [id number]

## 2021-04-30 NOTE — TOC Progression Note (Signed)
Transition of Care [facility]) - Progression Note    Patient Details  Name: Sara Barnes MRN: [id number] Date of Birth: 10-28-1940  Transition of Care [facility] CM/SW Contact  Ross Ludwig, [place] Phone Number: 04/30/2021, 5:12 PM  Clinical Narrative:    CSW spoke to [facility], they can provide [facility] PT for patient once she is medically ready for discharge.  CSW to continue to follow patient's progress throughout discharge planning.     Barriers to Discharge: No Barriers Identified  Expected Discharge Plan and Services                                                 Social Determinants of Health (SDOH) Interventions    Readmission Risk Interventions No flowsheet data found.

## 2021-04-30 NOTE — Progress Notes (Signed)
Pt went into Afib RVR with HR in 130s - 140s.   Only complaint was abd pain (not new), PRN dose of dilaudid was given.  Pt converted back to NSR at 0045.

## 2021-04-30 NOTE — Progress Notes (Signed)
PROGRESS NOTE    Sara Barnes  TMB:[id number] DOB: 1940/12/14 DOA: 04/19/2021 PCP: Ann Held, DO   Brief Narrative: The patient is a 81 year old Caucasian female with a past medical history significant for Crohn's disease, depression, insomnia, dyspepsia admitted with C. difficile colitis with complaints of nausea vomiting and diarrhea.  She is status post biopsy of the liver.   Assessment & Plan:   Principal Problem:   Intractable nausea and vomiting Active Problems:   Depression   Diarrhea   Crohn disease (HCC)   Hyponatremia   Clostridium difficile colitis   #1 C. difficile colitis patient presented with nausea vomiting and diarrhea  Vancomycin changed to oral tablets or capsules instead of the liquid form.  Patient refuses to take liquid vancomycin thinking that is increasing her diarrhea.      #2 chest discomfort and shortness of breath - CT abdomen shows post liver biopsy bleed which is causing her chest discomfort.  Continue Dilaudid at 1 mg every 3 as needed  Chest x-ray revealed new small left effusion and right pleural effusion.  Continue Lasix 40 mg daily for 3 days and reassess the need.  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  Now with CT of the abdomen showing new hepatic lesions suspicious for metastatic cancer.  She is status post biopsy on 04/24/2021.  Pathology pending.  #4 hypokalemia resolved potassium 3.6 we will give her 40 potassium today to keep her potassium above 4 as she goes into transient A. fib.  Her mag was 2.1.  #5 history of anxiety and depression continue home meds  #6 hyponatremia likely multifactorial sodium improved with normal saline to 132  However she was volume overloaded with third spacing and hypoalbuminemia.  IV fluids were then stopped.  Monitor sodium daily.    #7 AKI creatinine 1.24 from 1.48 from 1.77 from 2.28.  Renal ultrasound shows no evidence of hydronephrosis.   Avoid nephrotoxins and hypotension.  #8 insomnia patient has been taking Ambien 10 mg at bedtime for many number of years and she claims that she cannot sleep without it.  #9  Postop acute anemia hemoglobin with significant drop from 14-8 with CT evidence of hemoperitoneum and liver laceration since liver biopsy.  She received 1 unit of blood transfusion.  Lovenox was stopped.  Appreciate general surgery and IR input.  General surgery signed off.   #10 transient A. fib likely exacerbated by her current issues.  Patient had 2 bouts of transient A. fib during this hospital stay.  Continue metoprolol as tolerated for rate control.  She is not a candidate for anticoagulation due to intra-abdominal hematoma. Echocardiogram normal ejection fraction.  TSH normal.  Troponin level 27.  Nutrition Problem: Inadequate oral intake Etiology: acute illness  Signs/Symptoms: per patient/family report  Interventions: Boost Breeze, Prostat  Estimated body mass index is 25.66 kg/m as calculated from the following:   Height as of this encounter: 5' 6"  (1.676 m).   Weight as of this encounter: 72.1 kg.  DVT prophylaxis: none due to bleed   code Status: Full code Family Communication: Discussed with sister on the phone Disposition Plan:  Status is: Inpatient  Remains inpatient appropriate because: Needs IV fluids   Consultants:  Oncology interventional radiology  Procedures: Biopsy of the liver lesion Antimicrobials: P.o. vancomycin  Subjective: Still continues to complain of some abdominal pain Complains of nausea still has loose stools but better Has shortness of breath  However she feels she rested well last night and feels better today than yesterday.  Objective: Vitals:   04/29/21 1328 04/29/21 1818 04/29/21 2045 04/30/21 0650  BP: 135/65  128/71 122/75  Pulse: 76  78 76  Resp: 16 (!) 22 20 16   Temp: (!) 97.5 F (36.4 C)  98.3 F (36.8 C) (!) 97.4 F (36.3 C)  TempSrc: Oral  Oral Oral   SpO2: 100%  96% 94%  Weight:      Height:        Intake/Output Summary (Last 24 hours) at 04/30/2021 1321 Last data filed at 04/30/2021 0825 Gross per 24 hour  Intake 100 ml  Output 100 ml  Net 0 ml    Filed Weights   04/19/21 2130  Weight: 72.1 kg    Examination:  General exam: Appears chronically ill looking frail elderly female Respiratory system: Diminished breath sounds at the bases to auscultation. Respiratory effort normal. Cardiovascular system: S1 & S2 heard, RRR. No JVD, murmurs, rubs, gallops or clicks. No pedal edema. Gastrointestinal system: Abdomen is nondistended, soft and nontender. No organomegaly or masses felt. Normal bowel sounds heard. Central nervous system: Alert and oriented. No focal neurological deficits. Extremities: Symmetric 5 x 5 power. Skin: No rashes, lesions or ulcers Psychiatry: Judgement and insight appear normal. Mood & affect appropriate.     Data Reviewed: I have personally reviewed following labs and imaging studies  CBC: Recent Labs  Lab 04/24/21 0432 04/28/21 0721 04/28/21 1109 04/28/21 1111 04/28/21 2133 04/29/21 0503 04/30/21 0436  WBC 9.0 8.8 9.6 9.5  --  9.9 10.0  NEUTROABS 5.9  --   --   --   --   --   --   HGB 14.9 8.7* 8.9* 8.8* 10.1* 10.4* 11.8*  HCT 43.7 25.7* 27.1* 26.8* 30.9* 31.6* 35.4*  MCV 92.4 95.9 96.4 95.7  --  94.9 93.9  PLT 288 198 226 212  --  249 829    Basic Metabolic Panel: Recent Labs  Lab 04/24/21 0432 04/26/21 0435 04/26/21 1647 04/27/21 0530 04/28/21 0517 04/28/21 0521 04/29/21 0503 04/30/21 0436  NA 132*   < > 126* 133*  --  130* 132* 132*  K 3.5   < > 3.5 3.5  --  2.9* 3.8 3.6  CL 98   < > 98 98  --  99 101 99  CO2 24   < > 20* 24  --  24 22 25   GLUCOSE 102*   < > 121* 95  --  94 94 98  BUN 11   < > 31* 29*  --  26* 21 25*  CREATININE 0.97   < > 2.15* 1.77*  --  1.48* 1.17* 1.24*  CALCIUM 9.1   < > 8.2* 9.0  --  8.9 9.2 9.2  MG 2.0  --   --   --  2.1  --   --   --   PHOS 4.5  --    --   --   --   --   --   --    < > = values in this interval not displayed.    GFR: Estimated Creatinine Clearance: 37.4 mL/min (A) (by C-G formula based on SCr of 1.24 mg/dL (H)). Liver Function Tests: Recent Labs  Lab 04/24/21 0432 04/29/21 0503 04/30/21 0436  AST 22 17 18   ALT 19 13 15   ALKPHOS 80 82 88  BILITOT 0.6 1.3* 1.1  PROT 7.2 6.6 6.6  ALBUMIN 3.7 4.0  3.8    No results for input(s): LIPASE, AMYLASE in the last 168 hours. No results for input(s): AMMONIA in the last 168 hours. Coagulation Profile: No results for input(s): INR, PROTIME in the last 168 hours.  Cardiac Enzymes: No results for input(s): CKTOTAL, CKMB, CKMBINDEX, TROPONINI in the last 168 hours. BNP (last 3 results) No results for input(s): PROBNP in the last 8760 hours. HbA1C: No results for input(s): HGBA1C in the last 72 hours. CBG: No results for input(s): GLUCAP in the last 168 hours. Lipid Profile: No results for input(s): CHOL, HDL, LDLCALC, TRIG, CHOLHDL, LDLDIRECT in the last 72 hours. Thyroid Function Tests: No results for input(s): TSH, T4TOTAL, FREET4, T3FREE, THYROIDAB in the last 72 hours.  Anemia Panel: No results for input(s): VITAMINB12, FOLATE, FERRITIN, TIBC, IRON, RETICCTPCT in the last 72 hours. Sepsis Labs: No results for input(s): PROCALCITON, LATICACIDVEN in the last 168 hours.  No results found for this or any previous visit (from the past 240 hour(s)).         Radiology Studies: DG Chest 1 View  Result Date: 04/29/2021 CLINICAL DATA:  chest discomfort and shortness of breath EXAM: CHEST  1 VIEW COMPARISON:  Chest x-ray 04/26/2021, CT chest 04/15/2012 FINDINGS: The heart and mediastinal contours are unchanged. Aortic calcification. Prominent hilar vasculature. Biapical pleural/pulmonary scarring. Hazy airspace opacities of bilateral mid to lower lung zones. Slightly increased interstitial markings. At least small to moderate volume left and trace to small volume  right pleural effusions. No pneumothorax. No acute osseous abnormality. IMPRESSION: 1. Hazy airspace opacities of bilateral mid to lower lung zones. At least small to moderate volume left and trace to small volume right pleural effusions. 2. Pulmonary venous congestion. 3.  Aortic Atherosclerosis (ICD10-I70.0). Electronically Signed   By: Iven Finn M.D.   On: 04/29/2021 16:37        Scheduled Meds:  (feeding supplement) PROSource Plus  30 mL Oral BID BM   diclofenac Sodium  2 g Topical QID   feeding supplement  1 Container Oral TID BM   FLUoxetine  20 mg Oral Daily   furosemide  40 mg Intravenous Daily   metoprolol tartrate  12.5 mg Oral BID   multivitamin with minerals  1 tablet Oral Daily   predniSONE  5 mg Oral Q breakfast   vancomycin  125 mg Oral QID   Continuous Infusions:  famotidine (PEPCID) IV 20 mg (04/30/21 1221)   promethazine (PHENERGAN) injection (IM or IVPB) 12.5 mg (04/30/21 1029)     LOS: 11 days    Time spent: 45 minutes  Georgette Shell, MD  04/30/2021, 1:21 PM

## 2021-04-30 NOTE — Progress Notes (Addendum)
HEMATOLOGY-ONCOLOGY PROGRESS NOTE  ASSESSMENT AND PLAN: 1.  C. Difficile 2.  Intractable nausea, vomiting, diarrhea, improving 3.  History of left breast cancer 4.  Liver lesions 5.  Crohn's disease 6.  Post liver biopsy hematoma 7.  Anemia  -Diarrhea improving.  Management per hospitalist. -Status post liver biopsy.  Results currently pending. She is quite weak and unlikely to be a candidate for chemotherapy.  However, oral antiestrogen therapy or fulvestrant injection with CDK 4/6 inhibitor may be an option.  Final recommendations pending biopsy result. -She developed a post liver biopsy hematoma and her abdominal pain is improving.  IR is following. -Hemoglobin improving, monitor.  Mikey Bussing, DNP, AGPCNP-BC, AOCNP  SUBJECTIVE: The patient was seen and examined.  Her sister was at the bedside.  The patient reports that overall she feels quite weak.  She still has some mild abdominal pain which is improving.  She also reports ongoing diarrhea but overall improved.  Oncology History Overview Note  Cancer of lower-inner quadrant of left female breast [facility])   Staging form: Breast, AJCC 7th Edition     Clinical stage from 12/06/2014: Stage IA (T1c, N0, M0) - Unsigned     Pathologic stage from 12/13/2014: Stage IA (T1c, N0, cM0) - Unsigned      Cancer of lower-inner quadrant of left female breast ([place])  11/28/2014 Breast US   Highly suspicious 1.6 cm irregular shadowing mass at the site of palpable concern in the subareolar lower inner quadrant of the left breast at approximately 630.   11/29/2014 Receptors her2   Estrogen Receptor: 100%, POSITIVE, STRONG STAINING INTENSITY (PERFORMED MANUALLY) Progesterone Receptor: 95%, POSITIVE, STRONG STAINING INTENSITY (PERFORMED MANUALLY) Proliferation Marker Ki67: 10% (PERFORMED MANUALLY), Her2neu negative   11/29/2014 Initial Biopsy   Breast, left, needle core biopsy, subareolar about 6:30 - INVASIVE DUCTAL CARCINOMA, SEE COMMENT.   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   02/05/2015 - 02/26/2015 Radiation Therapy   left breast radiaiton    03/18/2015 -  Anti-estrogen oral therapy   She tried letrozole for 2-3 weeks, could not tolerate and stopped.   07/25/2020 Pathology Results   FINAL MICROSCOPIC DIAGNOSIS: A. SKIN, LEFT CHEST, EXCISION: - Invasive carcinoma involving dermis and subcutaneous soft tissue, consistent with patient's clinical history of invasive ductal carcinoma, see comment - Deep and lateral resection margins are negative for carcinoma  Appears grade I  ADDENDUM: PROGNOSTIC INDICATOR RESULTS: Immunohistochemical and morphometric analysis performed manually The tumor cells are NEGATIVE for Her2 (1+). Estrogen Receptor: POSITIVE, 90%, STRONG STAINING Progesterone Receptor: POSITIVE, 95%, STRONG STAINING Proliferation Marker Ki-67: 5%       REVIEW OF SYSTEMS:   Review of Systems  Constitutional:  Positive for malaise/fatigue and weight loss. Negative for fever.  HENT:  Positive for hearing loss.   Respiratory:  Negative for cough and shortness of breath.   Cardiovascular:  Negative for chest pain.  Gastrointestinal:  Positive for diarrhea and nausea. Negative for vomiting.  Skin:  Positive for rash.       Continued rash to her left breast  Psychiatric/Behavioral: Negative.  I have reviewed the past medical history, past surgical history, social history and family history with the patient and they are unchanged from previous note.   PHYSICAL EXAMINATION: ECOG PERFORMANCE STATUS: 1 -  Symptomatic but completely ambulatory  Vitals:   04/29/21 2045 04/30/21 0650  BP: 128/71 122/75  Pulse: 78 76  Resp: 20 16  Temp: 98.3 F (36.8 C) (!) 97.4 F (36.3 C)  SpO2: 96% 94%   Filed Weights   04/19/21 2130  Weight: 72.1 kg    Intake/Output from previous day: 01/02 0701 - 01/03 0700 In: 100 [IV Piggyback:100] Out: 300 [Urine:300]  Physical Exam Constitutional:      General: She is not in acute distress.    Appearance: Normal appearance.  HENT:     Head: Normocephalic.  Cardiovascular:     Rate and Rhythm: Normal rate.  Pulmonary:     Effort: Pulmonary effort is normal. No respiratory distress.  Abdominal:     General: Abdomen is flat. There is no distension.  Skin:    General: Skin is warm and dry.     Findings: Rash present.     Comments: Faint rash to left breast  Neurological:     General: No focal deficit present.     Mental Status: She is alert and oriented to person, place, and time.  Psychiatric:        Mood and Affect: Mood normal.        Thought Content: Thought content normal.        Judgment: Judgment normal.    LABORATORY DATA:  I have reviewed the data as listed CMP Latest Ref Rng & Units 04/30/2021 04/29/2021 04/28/2021  Glucose 70 - 99 mg/dL 98 94 94  BUN 8 - 23 mg/dL 25(H) 21 26(H)  Creatinine 0.44 - 1.00 mg/dL 1.24(H) 1.17(H) 1.48(H)  Sodium 135 - 145 mmol/L 132(L) 132(L) 130(L)  Potassium 3.5 - 5.1 mmol/L 3.6 3.8 2.9(L)  Chloride 98 - 111 mmol/L 99 101 99  CO2 22 - 32 mmol/L _0 Calcium 8.9 - 10.3 mg/dL 9.2 9.2 8.9  Total Protein 6.5 - 8.1 g/dL 6.6 6.6 -  Total Bilirubin 0.3 - 1.2 mg/dL 1.1 1.3(H) -  Alkaline Phos 38 - 126 U/L 88 82 -  AST 15 - 41 U/L 18 17 -  ALT 0 - 44 U/L 15 13 -    Lab Results  Component Value Date   WBC 10.0 04/30/2021   HGB 11.8 (L) 04/30/2021   HCT 35.4 (L) 04/30/2021   MCV 93.9 04/30/2021   PLT 321 04/30/2021   NEUTROABS 5.9 04/24/2021    No results found for: CEA1, CEA, CAN199, CA125,  PSA1  CT ABDOMEN PELVIS WO CONTRAST  Result Date: 04/27/2021 CLINICAL DATA:  Postop.  Abdominal pain.  Nausea vomiting. EXAM: CT ABDOMEN AND PELVIS WITHOUT CONTRAST TECHNIQUE: Multidetector CT imaging of the abdomen and pelvis was performed following the standard protocol without IV contrast. COMPARISON:  04/19/2021. FINDINGS: Lower chest: Moderate left and small to moderate right pleural effusions. Left greater than right lower lobe opacities consistent with atelectasis. Scattered linear and reticular opacities elsewhere at the lung bases, also likely atelectasis. Pleural effusions have increased in size from the prior CT. Hepatobiliary: Lateral segment of the left lobe appears larger than on the recent prior exam, particularly along its inferior margin. There is a vague area of relative hypoattenuation across the inferior aspect, segment 3. Remainder of the liver is unremarkable. Normal gallbladder. No bile duct dilation.  Pancreas: Unremarkable. No pancreatic ductal dilatation or surrounding inflammatory changes. Spleen: Normal in size without focal abnormality. Adrenals/Urinary Tract: Adrenal glands are unremarkable. Kidneys are normal, without renal calculi, focal lesion, or hydronephrosis. Bladder is unremarkable. Stomach/Bowel: Minimal hiatal hernia. Stomach mostly decompressed. There is haziness increased attenuation along the wall of the gastric antrum. Small bowel and colon are normal in caliber. No wall thickening or inflammation. Vascular/Lymphatic: Aortic atherosclerosis. No aneurysm. No enlarged lymph nodes. Reproductive: Status post hysterectomy. No adnexal masses. Other: Small amount of hemoperitoneum primarily collecting in the pelvis.a additional small areas of hyperattenuation lie underneath the left hemidiaphragm suspected to be additional hemorrhage, abutting the lateral segment the left liver lobe. Musculoskeletal: No fracture or acute finding. IMPRESSION: 1. Suspected hemorrhage from the  lateral segment of the left liver lobe. Lateral segment now larger than it was on the previous CT with an area of hypoattenuation in segment 3, which may reflect a contusion/laceration. The round lesion noted in segment 2 on the prior CT is not defined. There is apparent hemorrhage adjacent to the lateral segment, underneath the left hemidiaphragm and adjacent to the stomach. Definitive hemoperitoneum, small in amount, is seen in the pelvis. 2. No other acute abnormality within the abdomen or pelvis. 3. Moderate left and small right pleural effusion with associated lung base dependent opacity consistent with atelectasis. Pleural effusions have increased in size from prior CT. Electronically Signed   By: Lajean Manes M.D.   On: 04/27/2021 15:56   DG Chest 1 View  Result Date: 04/29/2021 CLINICAL DATA:  chest discomfort and shortness of breath EXAM: CHEST  1 VIEW COMPARISON:  Chest x-ray 04/26/2021, CT chest 04/15/2012 FINDINGS: The heart and mediastinal contours are unchanged. Aortic calcification. Prominent hilar vasculature. Biapical pleural/pulmonary scarring. Hazy airspace opacities of bilateral mid to lower lung zones. Slightly increased interstitial markings. At least small to moderate volume left and trace to small volume right pleural effusions. No pneumothorax. No acute osseous abnormality. IMPRESSION: 1. Hazy airspace opacities of bilateral mid to lower lung zones. At least small to moderate volume left and trace to small volume right pleural effusions. 2. Pulmonary venous congestion. 3.  Aortic Atherosclerosis (ICD10-I70.0). Electronically Signed   By: Iven Finn M.D.   On: 04/29/2021 16:37   DG Chest 1 View  Result Date: 04/26/2021 CLINICAL DATA:  Shortness of breath. EXAM: CHEST  1 VIEW COMPARISON:  Chest x-ray 03/24/2021 FINDINGS: Moderate size left pleural effusion is unchanged. There is a new small right pleural effusion. There are minimal bibasilar opacities. There is no pneumothorax.  Cardiomediastinal silhouette is unchanged, the heart is mildly enlarged. Surgical clips are seen along the left chest wall. No acute fractures are identified. IMPRESSION: 1. New small right pleural effusion. 2. Stable moderate left pleural effusion. 3. Minimal bibasilar atelectasis/airspace disease. Electronically Signed   By: Ronney Asters M.D.   On: 04/26/2021 15:45   DG Chest 1 View  Result Date: 04/22/2021 CLINICAL DATA:  81 year old female with shortness of breath. EXAM: CHEST  1 VIEW COMPARISON:  Chest radiograph 10/30/2016 and earlier. CT Abdomen and Pelvis 04/19/2021. FINDINGS: Portable AP semi upright view at 0607 hours. Some artifact due to bilateral breast implants, but also veiling opacity at the left lung base in keeping with ongoing left pleural effusion demonstrated recently on CT Abdomen and Pelvis. No superimposed pneumothorax or pulmonary edema. No air bronchograms. Allowing for portable technique mediastinal contours appear stable since 2018. Visualized tracheal air column is within normal limits. Chronic left chest wall surgical  clips. No acute osseous abnormality identified. IMPRESSION: 1. Ongoing small to moderate left pleural effusion seen recently on CT Abdomen and Pelvis. 2. Artifact from breast implants. No other acute cardiopulmonary abnormality. Electronically Signed   By: Genevie Ann M.D.   On: 04/22/2021 06:51   CT ABDOMEN PELVIS W CONTRAST  Result Date: 04/19/2021 CLINICAL DATA:  Nausea, vomiting, abdominal pain, acute nonlocalized. History of left breast with lumpectomy and radiation. EXAM: CT ABDOMEN AND PELVIS WITH CONTRAST TECHNIQUE: Multidetector CT imaging of the abdomen and pelvis was performed using the standard protocol following bolus administration of intravenous contrast. CONTRAST:  42m OMNIPAQUE IOHEXOL 350 MG/ML SOLN COMPARISON:  CT examination dated November 11, 2019 FINDINGS: Lower chest: Moderate left and small right pleural effusion with left basilar atelectasis.  Small hiatal hernia. Hepatobiliary: At least 3 hyperdense masses in the liver. In the left hepatic lobe measuring 2.2 x 2.2 cm (series 2 image 9). Another hypodense structure abutting the diaphragm measuring 1.4 x 1.6 cm (series 2, image 8). Another hyperdense structure in the inferior aspect of the right hepatic lobe (series 2, image 20). These masses are highly suspicious for metastatic disease until proven otherwise. Gallbladder is unremarkable. No biliary ductal dilatation. Pancreas: Unremarkable. No pancreatic ductal dilatation or surrounding inflammatory changes. Spleen: Normal in size without focal abnormality. Adrenals/Urinary Tract: Adrenal glands are unremarkable. Kidneys are normal, without renal calculi, focal lesion, or hydronephrosis. Bladder is unremarkable. Stomach/Bowel: Stomach is within normal limits. Small bowel loops are normal in caliber. Postsurgical changes for prior hemicolectomy. No evidence of bowel wall thickening, distention, or inflammatory changes. Vascular/Lymphatic: Aortic atherosclerosis. No enlarged abdominal or pelvic lymph nodes. Reproductive: Status post hysterectomy. No adnexal masses. Other: No abdominal wall hernia or abnormality. No abdominopelvic ascites. Musculoskeletal: Mild multilevel degenerative disc disease. No acute osseous abnormality. IMPRESSION: 1. Multiple hypodense hepatic masses, as detailed above, highly suspicious for metastatic disease. Differential include infectious/inflammatory process. Further evaluation with MRI examination with contrast would be helpful. 2. Moderate left and small right pleural effusion. 3. Postsurgical changes for prior hemicolectomy. No bowel obstruction, colitis or diverticulitis. Electronically Signed   By: IKeane PoliceD.O.   On: 04/19/2021 16:13   [place]  Result Date: 04/26/2021 CLINICAL DATA:  Elevated BUN and creatinine.  Short of breath. EXAM: RENAL / URINARY TRACT ULTRASOUND COMPLETE COMPARISON:  CT, 04/19/2021.  FINDINGS: Right Kidney: Renal measurements: 11 x 1 x 4.2 x 5.3 cm = volume: 128.7 mL. Echogenicity within normal limits. No mass or hydronephrosis visualized. Left Kidney: Renal measurements: 11.0 x 4.5 x 4.8 cm = volume: 124 mL. Echogenicity within normal limits. No mass or hydronephrosis visualized. Bladder: Unremarkable. Hypo to anechoic areas adjacent to the bladder matter nonspecific, possibly small amounts of peritoneal fluid versus fluid-filled bowel. Other: None. IMPRESSION: 1. Normal sonographic appearance of the kidneys. Electronically Signed   By: DLajean ManesM.D.   On: 04/26/2021 16:35   [place]LIVER)  Result Date: 04/24/2021 INDICATION: 81year old female with a history multiple liver lesions referred for biopsy EXAM: ULTRASOUND-GUIDED BIOPSY LIVER MASS MEDICATIONS: None. ANESTHESIA/SEDATION: Moderate (conscious) sedation was employed during this procedure. A total of Versed 1.5 mg and Fentanyl 100 mcg was administered intravenously by the radiology nurse. Total intra-service moderate Sedation Time: 10 minutes. The patient's level of consciousness and vital signs were monitored continuously by radiology nursing throughout the procedure under my direct supervision. FLUOROSCOPY TIME:  Ultrasound none COMPLICATIONS: None PROCEDURE: Informed written consent was obtained from the patient after a thorough discussion of the  procedural risks, benefits and alternatives. All questions were addressed. Maximal Sterile Barrier Technique was utilized including caps, mask, sterile gowns, sterile gloves, sterile drape, hand hygiene and skin antiseptic. A timeout was performed prior to the initiation of the procedure. Ultrasound survey of the right liver lobe performed with images stored and sent to PACs. The subxiphoid region was prepped with chlorhexidine in a sterile fashion, and a sterile drape was applied covering the operative field. A sterile gown and sterile gloves were used for the procedure. Local  anesthesia was provided with 1% Lidocaine. The patient was prepped and draped sterilely and the skin and subcutaneous tissues were generously infiltrated with 1% lidocaine. A 17 gauge introducer needle was then advanced under ultrasound guidance in an intercostal location into the left liver lobe, targeting left liver mass. The stylet was removed, and multiple separate 18 gauge core biopsy were retrieved. Samples were placed into formalin for transportation to the lab. Gel-Foam pledgets were then infused with a small amount of saline for assistance with hemostasis. The needle was removed, and a final ultrasound image was performed. The patient tolerated the procedure well and remained hemodynamically stable throughout. No complications were encountered and no significant blood loss was encounter. IMPRESSION: Status post ultrasound-guided biopsy of left liver mass Signed, Dulcy Fanny. Dellia Nims, RPVI [facility] Radiology Electronically Signed   By: Corrie Mckusick D.O.   On: 04/24/2021 14:43   DG CHEST PORT 1 VIEW  Result Date: 04/23/2021 CLINICAL DATA:  Shortness of breath EXAM: PORTABLE CHEST 1 VIEW COMPARISON:  04/22/2021 FINDINGS: Transverse diameter of heart is increased. There is diffuse haziness in the left mid and left lower lung fields. This may be due to moderate left pleural effusion. Evaluation of left mid and left lower lung fields for infiltrates is limited by the effusion. Right lung is clear. Right lateral CP angle is clear. There is no pneumothorax. IMPRESSION: No significant interval changes are noted in the homogeneous opacity in the left mid and left lower lung fields suggesting moderate left pleural effusion. Possibility of underlying atelectasis/pneumonitis in the left mid and left lower lung fields is not excluded. Electronically Signed   By: Elmer Picker M.D.   On: 04/23/2021 10:03   ECHOCARDIOGRAM COMPLETE  Result Date: 04/27/2021     ECHOCARDIOGRAM REPORT   Patient Name:   CHELBI HERBER Date of Exam: 04/27/2021 Medical Rec #:  [id number]      Height:       66.0 in Accession #:    [id number]     Weight:       159.0 lb Date of Birth:  [birth year]       BSA:          1.814 m Patient Age:    37 years       BP:           121/64 mmHg Patient Gender: F              HR:           87 bpm. Exam Location:  Inpatient Procedure: 2D Echo, Cardiac Doppler and Color Doppler Indications:    Abnormal ECG R94.31  History:        Patient has prior history of Echocardiogram examinations, most                 recent 06/22/2016. Risk Factors:Dyslipidemia. GERD. Past history  of breast cancer.  Sonographer:    Johny Chess RDCS Referring Phys: [id number] [place]  Sonographer Comments: Image acquisition challenging due to breast implants. Exam ended per patients request. IMPRESSIONS  1. Poor image quality due to breast implants and patient stopped exam before done.  2. Left ventricular ejection fraction, by estimation, is 60 to 65%. The left ventricle has normal function. The left ventricle has no regional wall motion abnormalities. Left ventricular diastolic parameters were normal.  3. Right ventricular systolic function is normal. The right ventricular size is normal. There is normal pulmonary artery systolic pressure.  4. The mitral valve is normal in structure. No evidence of mitral valve regurgitation. No evidence of mitral stenosis.  5. The aortic valve was not well visualized. Aortic valve regurgitation is not visualized. No aortic stenosis is present.  6. The inferior vena cava is normal in size with greater than 50% respiratory variability, suggesting right atrial pressure of 3 mmHg. FINDINGS  Left Ventricle: Left ventricular ejection fraction, by estimation, is 60 to 65%. The left ventricle has normal function. The left ventricle has no regional wall motion abnormalities. The left ventricular internal cavity size was normal in size.  There is  no left ventricular hypertrophy. Left ventricular diastolic parameters were normal. Right Ventricle: The right ventricular size is normal. No increase in right ventricular wall thickness. Right ventricular systolic function is normal. There is normal pulmonary artery systolic pressure. The tricuspid regurgitant velocity is 2.28 m/s, and  with an assumed right atrial pressure of 8 mmHg, the estimated right ventricular systolic pressure is 80.3 mmHg. Left Atrium: Left atrial size was normal in size. Right Atrium: Right atrial size was normal in size. Pericardium: There is no evidence of pericardial effusion. Mitral Valve: The mitral valve is normal in structure. No evidence of mitral valve regurgitation. No evidence of mitral valve stenosis. Tricuspid Valve: The tricuspid valve is normal in structure. Tricuspid valve regurgitation is not demonstrated. No evidence of tricuspid stenosis. Aortic Valve: The aortic valve was not well visualized. Aortic valve regurgitation is not visualized. No aortic stenosis is present. Pulmonic Valve: The pulmonic valve was normal in structure. Pulmonic valve regurgitation is not visualized. No evidence of pulmonic stenosis. Aorta: The aortic root is normal in size and structure. Venous: The inferior vena cava is normal in size with greater than 50% respiratory variability, suggesting right atrial pressure of 3 mmHg. IAS/Shunts: The interatrial septum was not assessed. Additional Comments: Poor image quality due to breast implants and patient stopped exam before done.  LEFT VENTRICLE PLAX 2D LVIDd:         3.70 cm   Diastology LVIDs:         2.30 cm   LV e' medial:    8.03 cm/s LV PW:         1.00 cm   LV E/e' medial:  11.8 LV IVS:        1.10 cm   LV e' lateral:   7.62 cm/s LVOT diam:     1.90 cm   LV E/e' lateral: 12.4 LV SV:         57 LV SV Index:   32 LVOT Area:     2.84 cm  RIGHT VENTRICLE RV S prime:     16.30 cm/s TAPSE (M-mode): 1.6 cm LEFT ATRIUM             Index         RIGHT ATRIUM  Index LA diam:        3.50 cm 1.93 cm/m   RA Area:     12.90 cm LA Vol (A2C):   32.9 ml 18.14 ml/m  RA Volume:   27.80 ml  15.33 ml/m LA Vol (A4C):   29.7 ml 16.37 ml/m LA Biplane Vol: 31.6 ml 17.42 ml/m  AORTIC VALVE LVOT Vmax:   125.00 cm/s LVOT Vmean:  66.600 cm/s LVOT VTI:    0.202 m  AORTA Ao Root diam: 2.90 cm Ao Asc diam:  2.80 cm MITRAL VALVE               TRICUSPID VALVE MV Area (PHT): 3.99 cm    TR Peak grad:   20.8 mmHg MV Decel Time: 190 msec    TR Vmax:        228.00 cm/s MV E velocity: 94.70 cm/s MV A velocity: 90.00 cm/s  SHUNTS MV E/A ratio:  1.05        Systemic VTI:  0.20 m                            Systemic Diam: 1.90 cm Jenkins Rouge MD Electronically signed by Jenkins Rouge MD Signature Date/Time: 04/27/2021/12:08:59 PM    Final      Future Appointments  Date Time Provider [place]  04/30/2021 11:00 AM Ann Held, DO [facility]      LOS: 11 days   Mikey Bussing, DNP, AGPCNP-BC, AOCNP 04/30/21   Addendum  I have seen the patient, examined her. I agree with the assessment and and plan and have edited the notes.   I met pt, her sister and son today, and reviewed her liver biopsy findings, which confirmed metastatic breast cancer, ER positive.  HER2 still pending.  I reviewed the incurable nature of her metastatic breast cancer, and the prognosis with or without treatment.  I discussed first-line treatment options, including oral aromatase inhibitor, or fulvestrant injections.  Patient is very concerned about potential diarrhea from treatment, due to her Crohn's disease.  Per patient's son, she had a severe diarrhea when she was on the oral medicine after breast surgery, which was letrozole.  She and sister wants me to check with her gastroenterologist Dr. Hilarie Fredrickson to get his permission for her to try antiestrogen therapy.  In this situation, I will probably recommend fulvestrant injection.  I discussed the role of CDK 4/6  inhibitor, she may not tolerate well due to the potential diarrhea, and I do not plan to give it as first line therapy.  I also discussed the chemo option, which will be difficult for her due to her advanced age and the potential a lot more side effects.. Her family encouraged her to consider, but want her to wait a few more weeks due to her persistent diarrhea, and pain. I will f/u before her discharge.   Truitt Merle  05/01/2021

## 2021-05-01 DIAGNOSIS — K5010 Crohn's disease of large intestine without complications: Secondary | ICD-10-CM

## 2021-05-01 DIAGNOSIS — R197 Diarrhea, unspecified: Secondary | ICD-10-CM | POA: Diagnosis not present

## 2021-05-01 DIAGNOSIS — K50819 Crohn's disease of both small and large intestine with unspecified complications: Secondary | ICD-10-CM | POA: Diagnosis not present

## 2021-05-01 DIAGNOSIS — R160 Hepatomegaly, not elsewhere classified: Secondary | ICD-10-CM | POA: Diagnosis not present

## 2021-05-01 DIAGNOSIS — R112 Nausea with vomiting, unspecified: Secondary | ICD-10-CM | POA: Diagnosis not present

## 2021-05-01 DIAGNOSIS — A0472 Enterocolitis due to Clostridium difficile, not specified as recurrent: Secondary | ICD-10-CM | POA: Diagnosis not present

## 2021-05-01 LAB — COMPREHENSIVE METABOLIC PANEL
ALT: 13 U/L (ref 0–44)
AST: 18 U/L (ref 15–41)
Albumin: 3.7 g/dL (ref 3.5–5.0)
Alkaline Phosphatase: 78 U/L (ref 38–126)
Anion gap: 12 (ref 5–15)
BUN: 31 mg/dL — ABNORMAL HIGH (ref 8–23)
CO2: 24 mmol/L (ref 22–32)
Calcium: 9.1 mg/dL (ref 8.9–10.3)
Chloride: 97 mmol/L — ABNORMAL LOW (ref 98–111)
Creatinine, Ser: 1.28 mg/dL — ABNORMAL HIGH (ref 0.44–1.00)
GFR, Estimated: 43 mL/min — ABNORMAL LOW (ref >60)
Glucose, Bld: 88 mg/dL (ref 70–99)
Potassium: 3.1 mmol/L — ABNORMAL LOW (ref 3.5–5.1)
Sodium: 133 mmol/L — ABNORMAL LOW (ref 135–145)
Total Bilirubin: 0.9 mg/dL (ref 0.3–1.2)
Total Protein: 6.7 g/dL (ref 6.5–8.1)

## 2021-05-01 LAB — CBC
HCT: 35.3 % — ABNORMAL LOW (ref 36.0–46.0)
Hemoglobin: 11.5 g/dL — ABNORMAL LOW (ref 12.0–15.0)
MCH: 30.8 pg (ref 26.0–34.0)
MCHC: 32.6 g/dL (ref 30.0–36.0)
MCV: 94.6 fL (ref 80.0–100.0)
Platelets: 324 10*3/uL (ref 150–400)
RBC: 3.73 MIL/uL — ABNORMAL LOW (ref 3.87–5.11)
RDW: 12.2 % (ref 11.5–15.5)
WBC: 10.5 10*3/uL (ref 4.0–10.5)
nRBC: 0 % (ref 0.0–0.2)

## 2021-05-01 MED ORDER — DIPHENOXYLATE-ATROPINE 2.5-0.025 MG PO TABS
1.0000 | ORAL_TABLET | Freq: Two times a day (BID) | ORAL | Status: DC
  Administered 2021-05-01: 1 via ORAL
  Filled 2021-05-01: qty 1

## 2021-05-01 MED ORDER — POTASSIUM CHLORIDE CRYS ER 20 MEQ PO TBCR
40.0000 meq | EXTENDED_RELEASE_TABLET | ORAL | Status: AC
  Administered 2021-05-01: 40 meq via ORAL
  Filled 2021-05-01 (×2): qty 2

## 2021-05-01 MED ORDER — POTASSIUM CHLORIDE 10 MEQ/100ML IV SOLN
10.0000 meq | INTRAVENOUS | Status: DC
  Administered 2021-05-01: 10 meq via INTRAVENOUS
  Filled 2021-05-01: qty 100

## 2021-05-01 NOTE — Progress Notes (Signed)
Chaplain engaged in a follow-up with [name] and her sister this morning.  Chaplain also met [name]'s son for the first time. Sister explained that [name] was not doing too well at time of visit.  Chaplain could see that [name] was tearful and very frustrated.  [name] had shared with her sister that she had been left in her stools all night.  Sister stated that [name] stated that she had called for help and no one ever came.  Sister stated that when she arrived the floor was even a mess with [name]'s stools as well.  Chaplain could assess that they both felt overwhelmed and anxious by this occurrence.   Chaplain offered support and listening.  Chaplain was also able to speak with [name]'s current nurse.  Chaplain tried to provide some comfort to them and apologized for any mishaps.  They voiced how hard it was to see [name] like that this morning.    Chaplain offered listening, support, and presence.    05/01/21 1300  Clinical Encounter Type  Visited With Patient and family together  Visit Type Follow-up;Spiritual support;Social support  Stress Factors  Patient Stress Factors Exhausted;Loss of control

## 2021-05-01 NOTE — Progress Notes (Signed)
Re-consultation  Referring Provider: Dr. Darrick Meigs    Primary Care Physician:  Carollee Herter, Alferd Apa, DO Primary Gastroenterologist:   Dr. Dr. Hilarie Fredrickson      Reason for re -consultation:    Question of C. difficile versus Crohn's causing symptoms now         HPI:   Sara Barnes is a 81 y.o. female with a past medical history of longstanding, complicated Crohn's disease with severe perianal disease and suffered cecal perforation in 2019 he underwent right hemicolectomy and end ileostomy, and ileostomy takedown in June 2020, on chronic low-dose Prednisone and as needed Lomotil, Imodium for IBD therapy, who initially was consulted by our service on 04/20/2021 for her Crohn's disease and new liver lesions.  We are reconsulted now in regards to question of C. difficile versus Crohn's causing her symptoms.    At time of initial consult patient's breast cancer history was discussed, apparently had declined a PET scan to check for other sites of metastasis 4/22.  At that time was admitted with new masses in her liver and also bilateral pleural effusions.  Her bowels look fine on CT.  Stool test did turn positive for C. difficile antigen.  It was discussed that her CT showed no inflammation of the bowel and C. difficile was positive.  She was started back on her usual IBD meds including Prednisone 5 mg once daily and scheduled Imodium as well as scheduled Lomotil.  Is recommended IR do percutaneous biopsy of one of the liver masses presumed to be from her breast cancer spreading.    04/24/2021 we last saw the patient during this hospitalization, at that time had a great decrease in the number of bowel movements daily after being started on Vanco and resumed on her low-dose Prednisone.  At that time 125 mg p.o. 4 times daily and recommended to complete a 14-day course (day 5 at that time), Florastor 1 p.o. twice daily.  Her Pepcid have been restarted once nightly for nausea.  Liver biopsy was pending that day for  concern of metastatic disease.    04/24/2021 patient underwent biopsy of her liver lesion and had postprocedure hematoma and anemia.    04/25/2021 patient's diarrhea was still improving and bowel movements were little bit more formed.    04/26/2021 patient noted to still have improvement in her diarrhea on Vancomycin.  She remained weak.  Apparently chest x-ray revealed a new small left effusion and right pleural effusion EKG was in A. fib RVR.  She was back in sinus rhythm that day.    04/28/2020 there was an acute anemia with a hemoglobin drop from 14-8 with CT evidence of hemoperitoneum and liver laceration since liver biopsy, she was given 1 unit of PRBCs and Lovenox was DC'd.  Surgery was consulted at that time.  That time surgery recommended serial hemoglobins and thought some of her pain was from hematoma in her liver.    04/29/2020 it was noted that patient's vancomycin was changed to oral tablets or capsules instead of the liquid form as patient refused to take the liquid thinking that it was increasing her diarrhea.  Hemoglobin remained stable and surgery signed off.    04/30/2020 no comment made about diarrhea.     Today, the patient is found with her sister by her bedside.  Together they explain that she has been on the vancomycin for 11 days and initially this was helping to decrease her diarrhea, but over the past 2 to  3 days she has had an exponential increase in the amount of diarrhea noting that even if she takes a sip of water she has a loose stool.  Patient tells me she has been off of her Imodium and Lomotil since being on Vancomycin and feels like this is the cause since she was well controlled from her Crohn's standpoint on these medicines before.  She would like to go back on these if possible.  Does describe some right upper quadrant discomfort but tells me that they have told me "this is from a hematoma after my liver biopsy".    Denies fever, chills, weight loss, blood in her stool or  symptoms that awaken her from sleep.     Past Medical History:  Diagnosis Date   Allergy    Anemia    Anxiety    Arthritis    Basal cell carcinoma    Blood transfusion without reported diagnosis    Bowel perforation ([place])    Cancer of lower-inner quadrant of female breast ([place]) 11/30/2014   left   Cancer of lower-inner quadrant of left female breast ([place]) 01/03/9479   Complication of anesthesia    anxiety post-op after ear surgeries   Crohn's disease of large intestine with abscess ([place])    Dental crowns present    recent root canal 11/2014   Depression    Family history of adverse reaction to anesthesia    pt's sister has hx. of post-op N/V   GERD (gastroesophageal reflux disease)    Hyperlipidemia    Indigestion    Perianal abscess    Personal history of radiation therapy 2016   PONV (postoperative nausea and vomiting)    Runny nose 12/07/2014   clear drainage, per pt.   Seasonal allergies    TMJ (temporomandibular joint syndrome)    Ulcerative colitis    Vitamin B12 deficiency     Past Surgical History:  Procedure Laterality Date   ABDOMINAL HYSTERECTOMY     partial   APPENDECTOMY     AUGMENTATION MAMMAPLASTY Bilateral    BASAL CELL CARCINOMA EXCISION Left    nose   BREAST BIOPSY Left 2019   benign   BREAST ENHANCEMENT SURGERY     BREAST LUMPECTOMY Left 2016   BREAST LUMPECTOMY WITH RADIOACTIVE SEED AND SENTINEL LYMPH NODE BIOPSY Left 12/13/2014   Procedure: LEFT BREAST LUMPECTOMY WITH RADIOACTIVE SEED AND SENTINEL LYMPH NODE BIOPSY;  Surgeon: [name], MD;  Location: [facility];  Service: General;  Laterality: Left;   COLONOSCOPY WITH PROPOFOL  05/28/2011; 06/28/2014   EXPLORATORY LAPAROTOMY  04/08/2018   with end ileostomy takedown and creation of loop ileostomy-- [facility] [place] N/A 01/08/2016   Procedure: Beryle Quant;  Surgeon: [name], MD;  Location: [facility]  END[facility]COPY;  Service: Gastroenterology;  Laterality: N/A;  needs MAC due to pain and anxiety   ILEOSTOMY N/A 05/25/2017   Procedure: DILATION OF ILEOSTOMY;  Surgeon: Rolm Bookbinder, MD;  Location: [facility];  Service: General;  Laterality: N/A;   ILEOSTOMY  02/11/2016   END ileostomy.  Dr Hassell Done   ILEOSTOMY CLOSURE N/A 06/02/2017   Procedure: RESECTION OF ILEAL STRICTURE; ILEOSTOMY REVISION ;  Surgeon: Michael Boston, MD;  Location: [facility];  Service: General;  Laterality: N/A;   ILEOSTOMY DILATION  05/25/2017   DILITATION OF ILEAL STRICTURE   ileostomy stricture resection  06/02/2017   with revision   INCISION AND DRAINAGE ABSCESS  2018  INCISION AND DRAINAGE PERIRECTAL ABSCESS N/A 09/28/2013   Procedure: IRRIGATION AND DEBRIDEMENT PERIANAL ABSCESS, proctoscopy;  Surgeon: Shann Medal, MD;  Location: [facility];  Service: General;  Laterality: N/A;   INCISION AND DRAINAGE PERIRECTAL ABSCESS N/A 01/22/2016   Procedure: IRRIGATION AND DEBRIDEMENT PERIRECTAL ABSCESS;  Surgeon: Armandina Gemma, MD;  [facility];  Service: General;  Laterality: N/A;   INNER EAR SURGERY Bilateral    LAPAROSCOPIC DIVERTED COLOSTOMY N/A 02/11/2016   Procedure: LAPAROSCOPIC DIVERTED ILEOSTOMY;  Surgeon: Johnathan Hausen, MD;  Location: [facility];  Service: General;  Laterality: N/A;   LAPAROSCOPY N/A 03/23/2017   Procedure: LAPAROSCOPY DIAGNOSTIC, EXPLORATORY LAPAROTOMY,  BOWEL RESECTION;  Surgeon: Jackolyn Confer, MD;  Location: [facility];  Service: General;  Laterality: N/A;    Family History  Problem Relation Age of Onset   Prostate cancer Brother 80   Kidney disease Brother    Diabetes Brother    Brain cancer Father    Rheum arthritis Mother    Anesthesia problems Sister        post-op N/V   Emphysema Maternal Grandfather    Asthma Sister    Rheum arthritis Brother    Esophageal cancer Neg Hx    Breast cancer Neg Hx    Rectal cancer Neg Hx    Stomach cancer Neg Hx    Colon cancer Neg Hx     Social History    Tobacco Use   Smoking status: Never   Smokeless tobacco: Never  Vaping Use   Vaping Use: Never used  Substance Use Topics   Alcohol use: Yes    Alcohol/week: 7.0 standard drinks    Types: 7 Glasses of wine per week    Comment: wine daily   Drug use: No    Prior to Admission medications   Medication Sig Start Date End Date Taking? Authorizing Provider  acetaminophen (TYLENOL) 500 MG tablet You can take 2 tablets every 6 hours as needed for pain.  Use this as your primary pain control.  Use Tramadol as a last resort.  You can buy this over the counter at any drug store.    DO NOT TAKE MORE THAN 4000 MG OF TYLENOL PER DAY.  IT CAN HARM YOUR LIVER. 03/31/17  Yes Earnstine Regal, PA-C  cyanocobalamin (,VITAMIN B-12,) 1000 MCG/ML injection As directed Patient taking differently: 1,000 mcg every 30 (thirty) days. First Sunday of each month 01/16/21  Yes Pyrtle, Lajuan Lines, MD  diphenoxylate-atropine (LOMOTIL) 2.5-0.025 MG tablet Take 1-2 tablets by mouth 3-4 times daily AS NEEDED for severe diarrhea 01/16/21  Yes Pyrtle, Lajuan Lines, MD  famotidine (PEPCID) 20 MG tablet Take 1 tablet (20 mg total) by mouth 2 (two) times daily. Patient taking differently: Take 20 mg by mouth daily as needed for indigestion. 01/16/21  Yes Pyrtle, Lajuan Lines, MD  FLUoxetine (PROZAC) 20 MG capsule Take 1 capsule (20 mg total) by mouth daily. 01/16/21  Yes Pyrtle, Lajuan Lines, MD  fluticasone (FLONASE) 50 MCG/ACT nasal spray Place 2 sprays into both nostrils daily. Patient taking differently: Place 2 sprays into both nostrils daily as needed for rhinitis. 11/12/20  Yes Roma Schanz R, DO  gabapentin (NEURONTIN) 100 MG capsule Take 3 capsules (300 mg total) by mouth 3 (three) times daily. Patient taking differently: Take 300 mg by mouth at bedtime as needed (for neuropathy). 11/15/20  Yes Marcial Pacas, MD  loperamide (IMODIUM) 2 MG capsule Take 1 capsule (2 mg total) by mouth as needed for diarrhea or loose  stools. 11/02/19  Yes Pyrtle,  Lajuan Lines, MD  ondansetron (ZOFRAN-ODT) 4 MG disintegrating tablet DISSOLVE ONE TABLET BY MOUTH EVERY 6 HOURS AS NEEDED FOR NAUSEA AND VOMITING 01/16/21  Yes Pyrtle, Lajuan Lines, MD  predniSONE (DELTASONE) 5 MG tablet TAKE 1 TABLET(5 MG) BY MOUTH DAILY 01/16/21  Yes Pyrtle, Lajuan Lines, MD  zolpidem (AMBIEN) 10 MG tablet Take 1 tablet (10 mg total) by mouth at bedtime as needed for sleep. 04/15/21 07/14/21 Yes Pyrtle, Lajuan Lines, MD    Current Facility-Administered Medications  Medication Dose Route Frequency Provider Last Rate Last Admin   (feeding supplement) PROSource Plus liquid 30 mL  30 mL Oral BID BM Georgette Shell, MD   30 mL at 05/01/21 1007   acetaminophen (TYLENOL) tablet 650 mg  650 mg Oral Q6H PRN Doran Heater, DO   650 mg at 04/28/21 1527   Or   acetaminophen (TYLENOL) suppository 650 mg  650 mg Rectal Q6H PRN Doran Heater, DO       albuterol (PROVENTIL) (2.5 MG/3ML) 0.083% nebulizer solution 2.5 mg  2.5 mg Nebulization Q6H PRN Doran Heater, DO   2.5 mg at 04/29/21 1532   ALPRAZolam (XANAX) tablet 0.5 mg  0.5 mg Oral TID PRN Georgette Shell, MD   0.5 mg at 05/01/21 1156   diclofenac Sodium (VOLTAREN) 1 % topical gel 2 g  2 g Topical QID Georgette Shell, MD   2 g at 05/01/21 1007   famotidine (PEPCID) IVPB 20 mg premix  20 mg Intravenous Q24H Hosie Poisson, MD 100 mL/hr at 05/01/21 1156 20 mg at 05/01/21 1156   feeding supplement (BOOST / RESOURCE BREEZE) liquid 1 Container  1 Container Oral TID BM Raiford Noble [place]mira, DO   1 Container at 04/30/21 1354   FLUoxetine (PROZAC) capsule 20 mg  20 mg Oral Daily MacNeil, Richard G, DO   20 mg at 05/01/21 0836   fluticasone (FLONASE) 50 MCG/ACT nasal spray 2 spray  2 spray Each Nare Daily PRN Doran Heater, DO       furosemide (LASIX) injection 40 mg  40 mg Intravenous Daily Georgette Shell, MD   40 mg at 04/30/21 1011   Gerhardt's butt cream   Topical PRN Raiford Noble Latif, DO   Given at 04/21/21 2032   HYDROmorphone  (DILAUDID) injection 1 mg  1 mg Intravenous Q3H PRN Georgette Shell, MD   1 mg at 05/01/21 1209   lip balm (CARMEX) ointment 1 application  1 application Topical PRN Raiford Noble Latif, DO       metoprolol tartrate (LOPRESSOR) tablet 12.5 mg  12.5 mg Oral BID Georgette Shell, MD   12.5 mg at 05/01/21 3818   multivitamin with minerals tablet 1 tablet  1 tablet Oral Daily Raiford Noble Bruneau, DO   1 tablet at 04/27/21 0902   predniSONE (DELTASONE) tablet 5 mg  5 mg Oral Q breakfast Doran Heater, DO   5 mg at 05/01/21 2993   promethazine (PHENERGAN) 12.5 mg in sodium chloride 0.9 % 50 mL IVPB  12.5 mg Intravenous Q6H PRN Hosie Poisson, MD 200 mL/hr at 04/30/21 1029 12.5 mg at 04/30/21 1029   vancomycin (VANCOCIN) capsule 125 mg  125 mg Oral QID Georgette Shell, MD   125 mg at 04/30/21 2133   zolpidem (AMBIEN) tablet 10 mg  10 mg Oral QHS PRN Georgette Shell, MD   10 mg at 04/30/21 2132    Allergies as  of 04/19/2021 - Review Complete 04/19/2021  Allergen Reaction Noted   Nsaids Other (See Comments) 06/02/2017   Codeine Nausea And Vomiting 01/20/2008   Mesalamine Nausea And Vomiting 05/13/2011   Oxycodone Itching and Rash 06/01/2017   Norco [hydrocodone-acetaminophen] Nausea Only 06/05/2017   Other Itching 12/07/2014   Sulfa antibiotics Rash 12/07/2014   Review of Systems:    Constitutional: No weight loss, fever or chills Skin: No rash Cardiovascular: No chest pain Respiratory: No SOB Gastrointestinal: See HPI and otherwise negative Genitourinary: No dysuria  Neurological: No headache, dizziness or syncope Musculoskeletal: No new muscle or joint pain Hematologic: No bleeding  Psychiatric: No history of depression or anxiety    Physical Exam:  Vital signs in last 24 hours: Temp:  [97 F (36.1 C)-97.7 F (36.5 C)] 97 F (36.1 C) (01/04 0501) Pulse Rate:  [70-81] 81 (01/04 0501) Resp:  [18-20] 20 (01/04 0501) BP: (131-145)/(57-77) 133/76 (01/04 0501) SpO2:   [95 %-99 %] 95 % (01/04 0501) Last BM Date: 05/01/21 General:  Pleasant Elderly Caucasian female appears to be in NAD, Well developed, Well nourished, alert and cooperative Head:  Normocephalic and atraumatic. Eyes:   PEERL, EOMI. No icterus. Conjunctiva pink. Ears:  Normal auditory acuity. Neck:  Supple Throat: Oral cavity and pharynx without inflammation, swelling or lesion.  Lungs: Respirations even and unlabored. Lungs clear to auscultation bilaterally.   No wheezes, crackles, or rhonchi.  Heart: Normal S1, S2. No MRG. Regular rate and rhythm. No peripheral edema, cyanosis or pallor.  Abdomen:  Soft, nondistended,Moderate RUQ ttp, No rebound or guarding. Normal bowel sounds. No appreciable masses or hepatomegaly. Rectal:  Not performed.  Msk:  Symmetrical without gross deformities. Peripheral pulses intact.  Extremities:  Without edema, no deformity or joint abnormality.  Neurologic:  Alert and  oriented x4;  grossly normal neurologically.  Skin:   Dry and intact without significant lesions or rashes. Psychiatric: Demonstrates good judgement and reason without abnormal affect or behaviors.  LAB RESULTS: Recent Labs    04/29/21 0503 04/30/21 0436 05/01/21 0433  WBC 9.9 10.0 10.5  HGB 10.4* 11.8* 11.5*  HCT 31.6* 35.4* 35.3*  PLT 249 321 324   BMET Recent Labs    04/29/21 0503 04/30/21 0436 05/01/21 0433  NA 132* 132* 133*  K 3.8 3.6 3.1*  CL 101 99 97*  CO2 _0 GLUCOSE 94 98 88  BUN 21 25* 31*  CREATININE 1.17* 1.24* 1.28*  CALCIUM 9.2 9.2 9.1   LFT Recent Labs    05/01/21 0433  PROT 6.7  ALBUMIN 3.7  AST 18  ALT 13  ALKPHOS 78  BILITOT 0.9    STUDIES: CT ABDOMEN PELVIS WO CONTRAST  Result Date: 04/30/2021 CLINICAL DATA:  Nausea, vomiting. EXAM: CT ABDOMEN AND PELVIS WITHOUT CONTRAST TECHNIQUE: Multidetector CT imaging of the abdomen and pelvis was performed following the standard protocol without IV contrast. COMPARISON:  April 27, 2021.  FINDINGS: Lower chest: Bilateral pleural effusions are noted with adjacent bibasilar atelectasis. Hepatobiliary: No gallstones or biliary dilatation is noted. 10.7 x 7.5 cm hemorrhage is again noted involving the posterior and inferior aspect of the left hepatic lobe which appears to be enlarged compared to prior exam. Pancreas: Unremarkable. No pancreatic ductal dilatation or surrounding inflammatory changes. Spleen: Normal in size without focal abnormality. Adrenals/Urinary Tract: Adrenal glands are unremarkable. Kidneys are normal, without renal calculi, focal lesion, or hydronephrosis. Bladder is unremarkable. Stomach/Bowel: Stomach appears normal. There is no evidence of bowel obstruction or inflammation. Vascular/Lymphatic:  Aortic atherosclerosis. No enlarged abdominal or pelvic lymph nodes. Reproductive: Status post hysterectomy. No adnexal masses. Other: Decreased amount of pelvic hemorrhage is noted compared to prior exam. No hernia is noted. Musculoskeletal: No acute or significant osseous findings. IMPRESSION: Large hemorrhage is seen involving left hepatic lobe which appears to be slightly enlarged compared to prior exam, most likely due to hepatic laceration. There is a slightly decreased amount of hemoperitoneum seen in the pelvis compared to prior exam. Bilateral pleural effusions are again noted with adjacent bibasilar atelectasis. Electronically Signed   By: Marijo Conception M.D.   On: 04/30/2021 17:48   DG Chest 1 View  Result Date: 04/29/2021 CLINICAL DATA:  chest discomfort and shortness of breath EXAM: CHEST  1 VIEW COMPARISON:  Chest x-ray 04/26/2021, CT chest 04/15/2012 FINDINGS: The heart and mediastinal contours are unchanged. Aortic calcification. Prominent hilar vasculature. Biapical pleural/pulmonary scarring. Hazy airspace opacities of bilateral mid to lower lung zones. Slightly increased interstitial markings. At least small to moderate volume left and trace to small volume right  pleural effusions. No pneumothorax. No acute osseous abnormality. IMPRESSION: 1. Hazy airspace opacities of bilateral mid to lower lung zones. At least small to moderate volume left and trace to small volume right pleural effusions. 2. Pulmonary venous congestion. 3.  Aortic Atherosclerosis (ICD10-I70.0). Electronically Signed   By: Iven Finn M.D.   On: 04/29/2021 16:37      Impression / Plan:   Impression: 1.  Longstanding complicated Crohn's disease: With severe perianal disease status post cecal perforation in 2019, underwent right hemicolectomy and end ileostomy with reversal in June 2020, on chronic low-dose prednisone 5 mg, Lomotil and Imodium outpatient, admitted 12/23 with worsening diarrhea thought due to C. difficile started on Vanco and seems to be well initially but now diarrhea has continued, repeat CT the abdomen and pelvis without contrast yesterday showed large hemorrhage involving the left hepatic lobe which appeared and slightly enlarged and bilateral pleural effusions, stomach appeared normal and there was no evidence of bowel obstruction or inflammation 2.  Nausea: Pepcid was initially restarted which seemed to help, nausea is improved per her 3.  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  Acute anemia: Thought due to hemoperitoneum/liver lac after liver biopsy, hemoglobin now stable 5. Hypokalemia  Plan: 1.  Could consider adding back in Lomotil/Imodium as this is part of patient's regular regimen for her Crohn's disease, though there is some increased risk of megacolon with this given diagnosis of C. difficile.  Another option would be changing from Vancomycin to Dificid or even possibly adding a bile acid sequestrant to see if this helps with the diarrhea. 2.  We will discuss above further with Dr. Havery Moros.  For now continue Vancomycin as prescribed.  Thank you for your kind consultation, we will continue to  follow.  Lavone Nian Shriyans Kuenzi  05/01/2021, 12:16 PM

## 2021-05-01 NOTE — Progress Notes (Signed)
Physical Therapy Treatment Patient Details Name: Sara Barnes MRN: [id number] DOB: 05-05-1940 Today's Date: 05/01/2021   History of Present Illness Patient is a 81 year old female who presented with nausea, vomitting and diarhea. Patient was admitted with intractable nausea and vomitting, C diff and dyspnea. Pt is s/p liver biopsy on 12/28 and CT abdomen shows post liver biopsy bleed which is causing her chest discomfort.  PMH: crohn's disease, R hemicolectomy, SBO, ileostomy revision.    PT Comments    General Comments: AxO x 3 pleasant and wity.  Asking about nurses by name that she had with prior visit.  [employer].  Has a very supportive younger sister who helps. Assisted OOB to amb pt tolerated very well.  Pt self able to get OOB and rise. General transfer comment: good safety cognition and use of hands to steady self.  Has been up/down multiple time to [facility] with sister. General Gait Details: amb around bed without need for any AD.  Good safety cognition and steady with light "furniture" need.  Used RW in hallway for safety only as pt typically uses no AD.  Good alternating gait and functional speed.  No overt LOB.  Distance limited by fatigue.  Days of loose stools and "little sleep" stated pt.  "I had a bad night last night". Pt plans to return home alone.  Recommendations for follow up therapy are one component of a multi-disciplinary discharge planning process, led by the attending physician.  Recommendations may be updated based on patient status, additional functional criteria and insurance authorization.  Follow Up Recommendations  Home health PT     Assistance Recommended at Discharge Intermittent Supervision/Assistance  Patient can return home with the following Assistance with cooking/housework;Assist for transportation   Equipment Recommendations  None recommended by PT    Recommendations for Other Services       Precautions / Restrictions  Precautions Precautions: Fall Precaution Comments: C - Diff     Mobility  Bed Mobility Overal bed mobility: Modified Independent             General bed mobility comments: pt self able with increased time.    Transfers Overall transfer level: Needs assistance Equipment used: None Transfers: Sit to/from Stand Sit to Stand: Supervision           General transfer comment: good safety cognition and use of hands to steady self.  Has been up/down multiple time to [facility] with sister.    Ambulation/Gait Ambulation/Gait assistance: Supervision Gait Distance (Feet): 115 Feet Assistive device: Rolling walker (2 wheels) Gait Pattern/deviations: Step-through pattern;Trunk flexed;Decreased stride length Gait velocity: WNL     General Gait Details: amb around bed without need for any AD.  Good safety cognition and steady with light "furniture" need.  Used RW in hallway for safety only as pt typically uses no AD.  Good alternating gait and functional speed.  No overt LOB.  Distance limited by fatigue.  Days of loose stools and "little sleep" stated pt.  "I had a bad night last night".   Stairs             Wheelchair Mobility    Modified Rankin (Stroke Patients Only)       Balance                                            Cognition Arousal/Alertness:  Awake/alert Behavior During Therapy: WFL for tasks assessed/performed Overall Cognitive Status: Within Functional Limits for tasks assessed                                 General Comments: AxO x 3 pleasant and wity.  Has a very supportive younger sister who helps.        Exercises      General Comments        Pertinent Vitals/Pain Pain Assessment: No/denies pain    Home Living                          Prior Function            PT Goals (current goals can now be found in the care plan section) Progress towards PT goals: Progressing toward goals     Frequency    Min 3X/week      PT Plan Current plan remains appropriate    Co-evaluation              AM-PAC PT "6 Clicks" Mobility   Outcome Measure  Help needed turning from your back to your side while in a flat bed without using bedrails?: None Help needed moving from lying on your back to sitting on the side of a flat bed without using bedrails?: None Help needed moving to and from a bed to a chair (including a wheelchair)?: None Help needed standing up from a chair using your arms (e.g., wheelchair or bedside chair)?: A Little Help needed to walk in hospital room?: A Little Help needed climbing 3-5 steps with a railing? : A Little 6 Click Score: 21    End of Session Equipment Utilized During Treatment: Gait belt Activity Tolerance: Patient limited by fatigue Patient left: in bed;with call bell/phone within reach;with family/visitor present Nurse Communication: Mobility status PT Visit Diagnosis: Difficulty in walking, not elsewhere classified (R26.2)     Time: 3013-1438 PT Time Calculation (min) (ACUTE ONLY): 11 min  Charges:  $Gait Training: 8-22 mins                     {Nashla Althoff  PTA Acute  Rehabilitation [employer]      [phone] Office      [phone]

## 2021-05-01 NOTE — Progress Notes (Signed)
Referring Physician(s): Toth,P  Supervising Physician: Arne Cleveland  Patient Status:  [facility] - In-pt  Chief Complaint:  Liver lesion s/p biopsy 04/24/21 with post-procedure hematoma and anemia   Subjective: Pt's main c/o is continued loose stools; known hx c diff/Chron's; she still has some epigastric discomfort; did have some nausea earlier but none now; no vomiting; denies sig dyspnea; sister/son in room; vital signs ok   Allergies: Nsaids, Codeine, Mesalamine, Oxycodone, Lactose intolerance (gi), Norco [hydrocodone-acetaminophen], Other, and Sulfa antibiotics  Medications: Prior to Admission medications   Medication Sig Start Date End Date Taking? Authorizing Provider  acetaminophen (TYLENOL) 500 MG tablet You can take 2 tablets every 6 hours as needed for pain.  Use this as your primary pain control.  Use Tramadol as a last resort.  You can buy this over the counter at any drug store.    DO NOT TAKE MORE THAN 4000 MG OF TYLENOL PER DAY.  IT CAN HARM YOUR LIVER. 03/31/17  Yes Earnstine Regal, PA-C  cyanocobalamin (,VITAMIN B-12,) 1000 MCG/ML injection As directed Patient taking differently: 1,000 mcg every 30 (thirty) days. First Sunday of each month 01/16/21  Yes Pyrtle, Lajuan Lines, MD  diphenoxylate-atropine (LOMOTIL) 2.5-0.025 MG tablet Take 1-2 tablets by mouth 3-4 times daily AS NEEDED for severe diarrhea 01/16/21  Yes Pyrtle, Lajuan Lines, MD  famotidine (PEPCID) 20 MG tablet Take 1 tablet (20 mg total) by mouth 2 (two) times daily. Patient taking differently: Take 20 mg by mouth daily as needed for indigestion. 01/16/21  Yes Pyrtle, Lajuan Lines, MD  FLUoxetine (PROZAC) 20 MG capsule Take 1 capsule (20 mg total) by mouth daily. 01/16/21  Yes Pyrtle, Lajuan Lines, MD  fluticasone (FLONASE) 50 MCG/ACT nasal spray Place 2 sprays into both nostrils daily. Patient taking differently: Place 2 sprays into both nostrils daily as needed for rhinitis. 11/12/20  Yes Roma Schanz R, DO  gabapentin  (NEURONTIN) 100 MG capsule Take 3 capsules (300 mg total) by mouth 3 (three) times daily. Patient taking differently: Take 300 mg by mouth at bedtime as needed (for neuropathy). 11/15/20  Yes Marcial Pacas, MD  loperamide (IMODIUM) 2 MG capsule Take 1 capsule (2 mg total) by mouth as needed for diarrhea or loose stools. 11/02/19  Yes Pyrtle, Lajuan Lines, MD  ondansetron (ZOFRAN-ODT) 4 MG disintegrating tablet DISSOLVE ONE TABLET BY MOUTH EVERY 6 HOURS AS NEEDED FOR NAUSEA AND VOMITING 01/16/21  Yes Pyrtle, Lajuan Lines, MD  predniSONE (DELTASONE) 5 MG tablet TAKE 1 TABLET(5 MG) BY MOUTH DAILY 01/16/21  Yes Pyrtle, Lajuan Lines, MD  zolpidem (AMBIEN) 10 MG tablet Take 1 tablet (10 mg total) by mouth at bedtime as needed for sleep. 04/15/21 07/14/21 Yes Pyrtle, Lajuan Lines, MD     Vital Signs: BP 133/76 (BP Location: Right Arm)    Pulse 81    Temp (!) 97 F (36.1 C)    Resp 20    Ht _0  (1.676 m)    Wt 158 lb 15.2 oz (72.1 kg)    SpO2 95%    BMI 25.66 kg/m   Physical Exam awake/HOH- hearing aids out; puncture site mid abd region clean and dry, mild- mod tender to palpation  Imaging: CT ABDOMEN PELVIS WO CONTRAST  Result Date: 04/30/2021 CLINICAL DATA:  Nausea, vomiting. EXAM: CT ABDOMEN AND PELVIS WITHOUT CONTRAST TECHNIQUE: Multidetector CT imaging of the abdomen and pelvis was performed following the standard protocol without IV contrast. COMPARISON:  April 27, 2021. FINDINGS: Lower chest: Bilateral pleural  effusions are noted with adjacent bibasilar atelectasis. Hepatobiliary: No gallstones or biliary dilatation is noted. 10.7 x 7.5 cm hemorrhage is again noted involving the posterior and inferior aspect of the left hepatic lobe which appears to be enlarged compared to prior exam. Pancreas: Unremarkable. No pancreatic ductal dilatation or surrounding inflammatory changes. Spleen: Normal in size without focal abnormality. Adrenals/Urinary Tract: Adrenal glands are unremarkable. Kidneys are normal, without renal calculi, focal  lesion, or hydronephrosis. Bladder is unremarkable. Stomach/Bowel: Stomach appears normal. There is no evidence of bowel obstruction or inflammation. Vascular/Lymphatic: Aortic atherosclerosis. No enlarged abdominal or pelvic lymph nodes. Reproductive: Status post hysterectomy. No adnexal masses. Other: Decreased amount of pelvic hemorrhage is noted compared to prior exam. No hernia is noted. Musculoskeletal: No acute or significant osseous findings. IMPRESSION: Large hemorrhage is seen involving left hepatic lobe which appears to be slightly enlarged compared to prior exam, most likely due to hepatic laceration. There is a slightly decreased amount of hemoperitoneum seen in the pelvis compared to prior exam. Bilateral pleural effusions are again noted with adjacent bibasilar atelectasis. Electronically Signed   By: Marijo Conception M.D.   On: 04/30/2021 17:48   CT ABDOMEN PELVIS WO CONTRAST  Result Date: 04/27/2021 CLINICAL DATA:  Postop.  Abdominal pain.  Nausea vomiting. EXAM: CT ABDOMEN AND PELVIS WITHOUT CONTRAST TECHNIQUE: Multidetector CT imaging of the abdomen and pelvis was performed following the standard protocol without IV contrast. COMPARISON:  04/19/2021. FINDINGS: Lower chest: Moderate left and small to moderate right pleural effusions. Left greater than right lower lobe opacities consistent with atelectasis. Scattered linear and reticular opacities elsewhere at the lung bases, also likely atelectasis. Pleural effusions have increased in size from the prior CT. Hepatobiliary: Lateral segment of the left lobe appears larger than on the recent prior exam, particularly along its inferior margin. There is a vague area of relative hypoattenuation across the inferior aspect, segment 3. Remainder of the liver is unremarkable. Normal gallbladder. No bile duct dilation. Pancreas: Unremarkable. No pancreatic ductal dilatation or surrounding inflammatory changes. Spleen: Normal in size without focal  abnormality. Adrenals/Urinary Tract: Adrenal glands are unremarkable. Kidneys are normal, without renal calculi, focal lesion, or hydronephrosis. Bladder is unremarkable. Stomach/Bowel: Minimal hiatal hernia. Stomach mostly decompressed. There is haziness increased attenuation along the wall of the gastric antrum. Small bowel and colon are normal in caliber. No wall thickening or inflammation. Vascular/Lymphatic: Aortic atherosclerosis. No aneurysm. No enlarged lymph nodes. Reproductive: Status post hysterectomy. No adnexal masses. Other: Small amount of hemoperitoneum primarily collecting in the pelvis.a additional small areas of hyperattenuation lie underneath the left hemidiaphragm suspected to be additional hemorrhage, abutting the lateral segment the left liver lobe. Musculoskeletal: No fracture or acute finding. IMPRESSION: 1. Suspected hemorrhage from the lateral segment of the left liver lobe. Lateral segment now larger than it was on the previous CT with an area of hypoattenuation in segment 3, which may reflect a contusion/laceration. The round lesion noted in segment 2 on the prior CT is not defined. There is apparent hemorrhage adjacent to the lateral segment, underneath the left hemidiaphragm and adjacent to the stomach. Definitive hemoperitoneum, small in amount, is seen in the pelvis. 2. No other acute abnormality within the abdomen or pelvis. 3. Moderate left and small right pleural effusion with associated lung base dependent opacity consistent with atelectasis. Pleural effusions have increased in size from prior CT. Electronically Signed   By: Lajean Manes M.D.   On: 04/27/2021 15:56   DG Chest 1 View  Result  Date: 04/29/2021 CLINICAL DATA:  chest discomfort and shortness of breath EXAM: CHEST  1 VIEW COMPARISON:  Chest x-ray 04/26/2021, CT chest 04/15/2012 FINDINGS: The heart and mediastinal contours are unchanged. Aortic calcification. Prominent hilar vasculature. Biapical pleural/pulmonary  scarring. Hazy airspace opacities of bilateral mid to lower lung zones. Slightly increased interstitial markings. At least small to moderate volume left and trace to small volume right pleural effusions. No pneumothorax. No acute osseous abnormality. IMPRESSION: 1. Hazy airspace opacities of bilateral mid to lower lung zones. At least small to moderate volume left and trace to small volume right pleural effusions. 2. Pulmonary venous congestion. 3.  Aortic Atherosclerosis (ICD10-I70.0). Electronically Signed   By: Iven Finn M.D.   On: 04/29/2021 16:37    Labs:  CBC: Recent Labs    04/28/21 1111 04/28/21 2133 04/29/21 0503 04/30/21 0436 05/01/21 0433  WBC 9.5  --  9.9 10.0 10.5  HGB 8.8* 10.1* 10.4* 11.8* 11.5*  HCT 26.8* 30.9* 31.6* 35.4* 35.3*  PLT 212  --  249 321 324    COAGS: Recent Labs    04/23/21 0415  INR 1.0    BMP: Recent Labs    04/28/21 0521 04/29/21 0503 04/30/21 0436 05/01/21 0433  NA 130* 132* 132* 133*  K 2.9* 3.8 3.6 3.1*  CL 99 101 99 97*  CO2 _0 GLUCOSE 94 94 98 88  BUN 26* 21 25* 31*  CALCIUM 8.9 9.2 9.2 9.1  CREATININE 1.48* 1.17* 1.24* 1.28*  GFRNONAA 36* 47* 44* 43*    LIVER FUNCTION TESTS: Recent Labs    04/24/21 0432 04/29/21 0503 04/30/21 0436 05/01/21 0433  BILITOT 0.6 1.3* 1.1 0.9  AST _1 ALT _2 ALKPHOS 80 82 88 78  PROT 7.2 6.6 6.6 6.7  ALBUMIN 3.7 4.0 3.8 3.7    Assessment and Plan: Liver lesion s/p biopsy 04/24/21 with post-procedure hematoma and anemia ; hx c diff, met breast cancer, AKI; afebrile; BP ok, HR ok, hgb 11.5(11.8), WBC nl; LFT's ok, creat 1.28(1.24), liver bx path- findings c/w met breast/mammary ca; f/u CT A/P yesterday:  Large hemorrhage is seen involving left hepatic lobe which appears to be slightly enlarged compared to prior exam, most likely due to hepatic laceration. There is a slightly decreased amount of hemoperitoneum seen in the pelvis compared to prior exam.    Bilateral pleural effusions are again noted with adjacent bibasilar atelectasis  Latest images reviewed  by Dr. Vernard Gambles; if pt's symptoms/labs worsen could consider CTA A/P to eval for poss pseudoanurysm/embo; if she remains stable can cont to monitor conservatively; with current creat of 1.28 further imaging with CTA would require adequate IV hydration before and after scan and minimal contrast use; above d/w Dr. Barrett Henle)   Electronically Signed: D. Rowe Robert, PA-C 05/01/2021, 10:21 AM   I spent a total of 15 Minutes at the the patient's bedside AND on the patient's hospital floor or unit, greater than 50% of which was counseling/coordinating care for post liver biopsy hematoma    Patient ID: Sara Barnes, female   DOB: 1940/07/07, 81 y.o.   MRN: [id number]

## 2021-05-01 NOTE — Progress Notes (Signed)
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 difficile colitis with complaints of nausea, vomiting and diarrhea.  She s/p biopsy of liver.  Post biopsy patient developed liver hematoma.  She has ongoing diarrhea.    Subjective   Patient seen and examined, continues to have diarrhea.  She feels that vancomycin is making her diarrhea worse.  Also had bloody bowel movement this morning.   Assessment/Plan:    C. difficile colitis/diarrhea/Crohn's disease -Patient was diagnosed with C. difficile colitis -Started on p.o. vancomycin -Diarrhea initially improved now again worsened since last night -Called and discussed with ID, ID does not feel that addition of Flagyl or Dificid would help -She has a history of Crohn's disease and had bloody bowel movement this morning -We will reconsult gastroenterology  S/p liver biopsy/hematoma -CT abdomen/pelvis shows hematoma s/p liver biopsy -General surgery and IR were consulted -General surgery has signed off -IR following -IR is considering CTA abdomen/pelvis to assess for possible pseudoaneurysm/embolism  History of metastatic breast cancer -With mets to chest wall that has been excised -CT abdomen/pelvis showed new hepatic lesions suspicious for metastatic cancer -She s/p liver biopsy on 04/24/2021 -Biopsy results pending  Hypokalemia -Potassium was 3.1; likely from ongoing diarrhea -We will replace potassium and follow BMP in am   Acute kidney injury -Resolved with IV hydration -Renal ultrasound shows no evidence of hydronephrosis  Anemia from acute blood loss -Secondary to liver laceration since liver biopsy -Patient received 1 unit PRBC -Hemoglobin  is stable at 11.5 -Lovenox was stopped  Transient atrial fibrillation -Likely from ongoing diarrhea, electrolyte imbalance, anemia -Patient had 2 episodes of transient atrial fibrillation during hospital stay -Continue as needed metoprolol for rate control -Patient is not a candidate for anticoagulation due to intra-abdominal hematoma -Echocardiogram showed normal EF -TSH normal   Medications     (feeding supplement) PROSource Plus  30 mL Oral BID BM   diclofenac Sodium  2 g Topical QID   diphenoxylate-atropine  1 tablet Oral BID   feeding supplement  1 Container Oral TID BM   FLUoxetine  20 mg Oral Daily   metoprolol tartrate  12.5 mg Oral BID   multivitamin with minerals  1 tablet Oral Daily   predniSONE  5 mg Oral Q breakfast   vancomycin  125 mg Oral QID     Data Reviewed:   CBG:  No results for input(s): GLUCAP in the last 168 hours.  SpO2: 93 % O2 Flow Rate (L/min): 1 L/min    Vitals:   04/30/21 1351 04/30/21 2000 05/01/21 0501 05/01/21 1234  BP: (!) 131/57 (!) 145/77 133/76 124/79  Pulse: 70 81 81 75  Resp: 18 19 20 18   Temp: 97.7 F (36.5 C) 97.7 F (36.5 C) (!) 97 F (36.1 C) 98.1 F (36.7 C)  TempSrc:  Oral    SpO2: 98% 99% 95% 93%  Weight:      Height:         Intake/Output Summary (Last 24 hours) at 05/01/2021 1626 Last data filed at 05/01/2021 1000 Gross per 24 hour  Intake 240 ml  Output --  Net 240 ml    01/02 1901 - 01/04 0700 In: 100  Out: 100 [Urine:100]  Filed Weights   04/19/21  2130  Weight: 72.1 kg    Data Reviewed: Basic Metabolic Panel: Recent Labs  Lab 04/27/21 0530 04/28/21 0517 04/28/21 0521 04/29/21 0503 04/30/21 0436 05/01/21 0433  NA 133*  --  130* 132* 132* 133*  K 3.5  --  2.9* 3.8 3.6 3.1*  CL 98  --  99 101 99 97*  CO2 24  --  24 22 25 24   GLUCOSE 95  --  94 94 98 88  BUN 29*  --  26* 21 25* 31*  CREATININE 1.77*  --  1.48* 1.17* 1.24* 1.28*  CALCIUM 9.0  --  8.9 9.2 9.2 9.1  MG  --  2.1  --   --   --    --    Liver Function Tests: Recent Labs  Lab 04/29/21 0503 04/30/21 0436 05/01/21 0433  AST 17 18 18   ALT 13 15 13   ALKPHOS 82 88 78  BILITOT 1.3* 1.1 0.9  PROT 6.6 6.6 6.7  ALBUMIN 4.0 3.8 3.7   No results for input(s): LIPASE, AMYLASE in the last 168 hours. No results for input(s): AMMONIA in the last 168 hours. CBC: Recent Labs  Lab 04/28/21 1109 04/28/21 1111 04/28/21 2133 04/29/21 0503 04/30/21 0436 05/01/21 0433  WBC 9.6 9.5  --  9.9 10.0 10.5  HGB 8.9* 8.8* 10.1* 10.4* 11.8* 11.5*  HCT 27.1* 26.8* 30.9* 31.6* 35.4* 35.3*  MCV 96.4 95.7  --  94.9 93.9 94.6  PLT 226 212  --  249 321 324   Cardiac Enzymes: No results for input(s): CKTOTAL, CKMB, CKMBINDEX, TROPONINI in the last 168 hours. BNP (last 3 results) No results for input(s): BNP in the last 8760 hours.  ProBNP (last 3 results) No results for input(s): PROBNP in the last 8760 hours.  CBG: No results for input(s): GLUCAP in the last 168 hours.     Radiology Reports  CT ABDOMEN PELVIS WO CONTRAST  Result Date: 04/30/2021 CLINICAL DATA:  Nausea, vomiting. EXAM: CT ABDOMEN AND PELVIS WITHOUT CONTRAST TECHNIQUE: Multidetector CT imaging of the abdomen and pelvis was performed following the standard protocol without IV contrast. COMPARISON:  April 27, 2021. FINDINGS: Lower chest: Bilateral pleural effusions are noted with adjacent bibasilar atelectasis. Hepatobiliary: No gallstones or biliary dilatation is noted. 10.7 x 7.5 cm hemorrhage is again noted involving the posterior and inferior aspect of the left hepatic lobe which appears to be enlarged compared to prior exam. Pancreas: Unremarkable. No pancreatic ductal dilatation or surrounding inflammatory changes. Spleen: Normal in size without focal abnormality. Adrenals/Urinary Tract: Adrenal glands are unremarkable. Kidneys are normal, without renal calculi, focal lesion, or hydronephrosis. Bladder is unremarkable. Stomach/Bowel: Stomach appears normal.  There is no evidence of bowel obstruction or inflammation. Vascular/Lymphatic: Aortic atherosclerosis. No enlarged abdominal or pelvic lymph nodes. Reproductive: Status post hysterectomy. No adnexal masses. Other: Decreased amount of pelvic hemorrhage is noted compared to prior exam. No hernia is noted. Musculoskeletal: No acute or significant osseous findings. IMPRESSION: Large hemorrhage is seen involving left hepatic lobe which appears to be slightly enlarged compared to prior exam, most likely due to hepatic laceration. There is a slightly decreased amount of hemoperitoneum seen in the pelvis compared to prior exam. Bilateral pleural effusions are again noted with adjacent bibasilar atelectasis. Electronically Signed   By: Marijo Conception M.D.   On: 04/30/2021 17:48       Antibiotics: Anti-infectives (From admission, onward)    Start     Dose/Rate Route Frequency Ordered Stop   04/29/21 1000  vancomycin (VANCOCIN) capsule 125 mg        125 mg Oral 4 times daily 04/29/21 0815     04/28/21 1430  metroNIDAZOLE (FLAGYL) tablet 500 mg  Status:  Discontinued        500 mg Oral Every 8 hours 04/28/21 1358 04/29/21 0945   04/25/21 1245  vancomycin (VANCOCIN) 50 mg/mL oral solution SOLN 125 mg  Status:  Discontinued        125 mg Oral Every 6 hours 04/25/21 1145 04/29/21 0815   04/20/21 1500  vancomycin (VANCOCIN) capsule 125 mg  Status:  Discontinued        125 mg Oral 4 times daily 04/20/21 1405 04/25/21 1145         DVT prophylaxis: SCDs  Code Status: Full code  Family Communication: Discussed with patient's sister at bedside   Consultants: Gastroenterology Intervention radiology  Oncology  Procedures:     Objective    Physical Examination:   General-appears in no acute distress Heart-S1-S2, regular, no murmur auscultated Lungs-clear to auscultation bilaterally, no wheezing or crackles auscultated Abdomen-soft, nontender, no organomegaly Extremities-no edema in the  lower extremities Neuro-alert, oriented x3, no focal deficit noted  Status is: Inpatient  Dispo: The patient is from: Home              Anticipated d/c is to: Home              Anticipated d/c date is: 05/03/2021              Patient currently not stable for discharge  Barrier to discharge-ongoing diarrhea  COVID-19 Labs  No results for input(s): DDIMER, FERRITIN, LDH, CRP in the last 72 hours.  Lab Results  Component Value Date   [place]ma NEGATIVE 04/19/2021            No results found for this or any previous visit (from the past 240 hour(s)).  Oswald Hillock   [facility] If 7PM-7AM, please contact night-coverage at [email or web address], Office  [phone]   05/01/2021, 4:26 PM  LOS: 12 days

## 2021-05-02 DIAGNOSIS — R112 Nausea with vomiting, unspecified: Secondary | ICD-10-CM | POA: Diagnosis not present

## 2021-05-02 DIAGNOSIS — A0472 Enterocolitis due to Clostridium difficile, not specified as recurrent: Secondary | ICD-10-CM | POA: Diagnosis not present

## 2021-05-02 DIAGNOSIS — R197 Diarrhea, unspecified: Secondary | ICD-10-CM | POA: Diagnosis not present

## 2021-05-02 DIAGNOSIS — K5010 Crohn's disease of large intestine without complications: Secondary | ICD-10-CM | POA: Diagnosis not present

## 2021-05-02 DIAGNOSIS — R160 Hepatomegaly, not elsewhere classified: Secondary | ICD-10-CM | POA: Diagnosis not present

## 2021-05-02 DIAGNOSIS — K50819 Crohn's disease of both small and large intestine with unspecified complications: Secondary | ICD-10-CM | POA: Diagnosis not present

## 2021-05-02 LAB — TROPONIN I (HIGH SENSITIVITY)
Troponin I (High Sensitivity): 29 ng/L — ABNORMAL HIGH (ref <18)
Troponin I (High Sensitivity): 21 ng/L — ABNORMAL HIGH (ref <18)

## 2021-05-02 LAB — COMPREHENSIVE METABOLIC PANEL
ALT: 15 U/L (ref 0–44)
AST: 20 U/L (ref 15–41)
Albumin: 3.8 g/dL (ref 3.5–5.0)
Alkaline Phosphatase: 85 U/L (ref 38–126)
Anion gap: 10 (ref 5–15)
BUN: 31 mg/dL — ABNORMAL HIGH (ref 8–23)
CO2: 23 mmol/L (ref 22–32)
Calcium: 9.1 mg/dL (ref 8.9–10.3)
Chloride: 99 mmol/L (ref 98–111)
Creatinine, Ser: 1.14 mg/dL — ABNORMAL HIGH (ref 0.44–1.00)
GFR, Estimated: 49 mL/min — ABNORMAL LOW (ref >60)
Glucose, Bld: 88 mg/dL (ref 70–99)
Potassium: 3.9 mmol/L (ref 3.5–5.1)
Sodium: 132 mmol/L — ABNORMAL LOW (ref 135–145)
Total Bilirubin: 0.8 mg/dL (ref 0.3–1.2)
Total Protein: 6.8 g/dL (ref 6.5–8.1)

## 2021-05-02 LAB — CBC
HCT: 38.2 % (ref 36.0–46.0)
Hemoglobin: 12.9 g/dL (ref 12.0–15.0)
MCH: 31.3 pg (ref 26.0–34.0)
MCHC: 33.8 g/dL (ref 30.0–36.0)
MCV: 92.7 fL (ref 80.0–100.0)
Platelets: 346 10*3/uL (ref 150–400)
RBC: 4.12 MIL/uL (ref 3.87–5.11)
RDW: 12 % (ref 11.5–15.5)
WBC: 11.2 10*3/uL — ABNORMAL HIGH (ref 4.0–10.5)
nRBC: 0 % (ref 0.0–0.2)

## 2021-05-02 MED ORDER — SODIUM CHLORIDE 0.9 % IV SOLN: INTRAVENOUS | Status: AC

## 2021-05-02 MED ORDER — CHOLESTYRAMINE LIGHT 4 G PO PACK
4.0000 g | PACK | ORAL | Status: DC
  Administered 2021-05-02 – 2021-05-11 (×7): 4 g via ORAL
  Filled 2021-05-02 (×16): qty 1

## 2021-05-02 MED ORDER — OXYCODONE HCL 5 MG PO TABS
5.0000 mg | ORAL_TABLET | Freq: Four times a day (QID) | ORAL | Status: DC | PRN
  Filled 2021-05-02: qty 1

## 2021-05-02 MED ORDER — DIPHENOXYLATE-ATROPINE 2.5-0.025 MG PO TABS: 1.0000 | ORAL_TABLET | Freq: Four times a day (QID) | ORAL | Status: DC | PRN

## 2021-05-02 MED ORDER — SACCHAROMYCES BOULARDII 250 MG PO CAPS
250.0000 mg | ORAL_CAPSULE | Freq: Two times a day (BID) | ORAL | Status: DC
  Administered 2021-05-02 – 2021-05-16 (×29): 250 mg via ORAL
  Filled 2021-05-02 (×29): qty 1

## 2021-05-02 NOTE — Progress Notes (Signed)
[facility]  PROGRESS NOTE  Sara Barnes ZOX:[id number] DOB: 05-30-1940 DOA: 04/19/2021 PCP: Ann Held, DO   Brief HPI:   81 year old female with past medical history of Crohn's disease, depression, insomnia, metastatic breast cancer, with mets to chest wall that has been excised, dyspepsia admitted with C. difficile colitis with complaints of nausea, vomiting and diarrhea.  She s/p biopsy of liver.  Post biopsy patient developed liver hematoma.  She has ongoing diarrhea.    Subjective   Patient developed mental status changes this morning thought to be due to combination of Xanax, Lomotil.  Patient is also on Dilaudid.  Mental status has improved.  Xanax has been discontinued.  Patient says that Xanax made her groggy.  Wants to continue with Lomotil, however GI recommends stopping Lomotil and trying cholestyramine.  Patient continues to have diarrhea.  Vancomycin was held yesterday which has been restarted.   Assessment/Plan:    C. difficile colitis/diarrhea/Crohn's disease -Patient was diagnosed with C. difficile colitis -Started on p.o. vancomycin -Diarrhea initially improved now again worsened since last night -Called and discussed with ID, ID does not feel that addition of Flagyl or Dificid would help -She has a history of Crohn's disease and had bloody bowel movement this morning -Gastroenterology was consulted, recommended stopping Lomotil and trying cholestyramine -We will discontinue Lomotil and continue with cholestyramine.  Vancomycin has been restarted.  Vancomycin was held yesterday, as patient complained of worsening diarrhea due to vancomycin.  S/p liver biopsy/hematoma -CT abdomen/pelvis shows hematoma s/p liver biopsy -General surgery and IR were consulted -General surgery has signed off -IR following -IR is considering CTA abdomen/pelvis to assess for possible pseudoaneurysm/embolism  History of metastatic breast cancer -With mets to chest wall  that has been excised -CT abdomen/pelvis showed new hepatic lesions suspicious for metastatic cancer -She s/p liver biopsy on 04/24/2021 -Biopsy results pending  Hypokalemia - replete   Acute kidney injury -Resolved with IV hydration -Renal ultrasound shows no evidence of hydronephrosis  Anemia from acute blood loss -Secondary to liver laceration since liver biopsy -Patient received 1 unit PRBC -Hemoglobin is stable at 11.5 -Lovenox was stopped  Transient atrial fibrillation -Likely from ongoing diarrhea, electrolyte imbalance, anemia -Patient had 2 episodes of transient atrial fibrillation during hospital stay -Continue as needed metoprolol for rate control -Patient is not a candidate for anticoagulation due to intra-abdominal hematoma -Echocardiogram showed normal EF -TSH normal  Chest pain -Patient has been having intermittent chest pain, though she also complains of epigastric discomfort with cramping abdominal pain along with these episodes -EKG obtained this morning was unremarkable -Troponin 29, 21 -Doubt cardiac cause of chest pain, likely chest pain is due to the nausea, abdominal cramping  Medications     (feeding supplement) PROSource Plus  30 mL Oral BID BM   cholestyramine light  4 g Oral Q24H   diclofenac Sodium  2 g Topical QID   feeding supplement  1 Container Oral TID BM   FLUoxetine  20 mg Oral Daily   metoprolol tartrate  12.5 mg Oral BID   multivitamin with minerals  1 tablet Oral Daily   predniSONE  5 mg Oral Q breakfast   saccharomyces boulardii  250 mg Oral BID   vancomycin  125 mg Oral QID     Data Reviewed:   CBG:  No results for input(s): GLUCAP in the last 168 hours.  SpO2: 95 % O2 Flow Rate (L/min): 1 L/min    Vitals:   05/01/21 2031 05/02/21 0650  05/02/21 0825 05/02/21 1326  BP: 136/83 122/75 119/74 125/65  Pulse: 85 77 80 82  Resp: 18 16 (!) 22 18  Temp: 98 F (36.7 C)  98 F (36.7 C) 98.6 F (37 C)  TempSrc: Oral  Oral  Oral  SpO2: 97% 99% 97% 95%  Weight:      Height:         Intake/Output Summary (Last 24 hours) at 05/02/2021 1539 Last data filed at 05/02/2021 1400 Gross per 24 hour  Intake 439.05 ml  Output --  Net 439.05 ml    01/03 1901 - 01/05 0700 In: 327.1 [P.O.:240] Out: -   Filed Weights   04/19/21 2130  Weight: 72.1 kg    Data Reviewed: Basic Metabolic Panel: Recent Labs  Lab 04/28/21 0517 04/28/21 0521 04/29/21 0503 04/30/21 0436 05/01/21 0433 05/02/21 0545  NA  --  130* 132* 132* 133* 132*  K  --  2.9* 3.8 3.6 3.1* 3.9  CL  --  99 101 99 97* 99  CO2  --  24 22 25 24 23   GLUCOSE  --  94 94 98 88 88  BUN  --  26* 21 25* 31* 31*  CREATININE  --  1.48* 1.17* 1.24* 1.28* 1.14*  CALCIUM  --  8.9 9.2 9.2 9.1 9.1  MG 2.1  --   --   --   --   --    Liver Function Tests: Recent Labs  Lab 04/29/21 0503 04/30/21 0436 05/01/21 0433 05/02/21 0545  AST 17 18 18 20   ALT 13 15 13 15   ALKPHOS 82 88 78 85  BILITOT 1.3* 1.1 0.9 0.8  PROT 6.6 6.6 6.7 6.8  ALBUMIN 4.0 3.8 3.7 3.8   No results for input(s): LIPASE, AMYLASE in the last 168 hours. No results for input(s): AMMONIA in the last 168 hours. CBC: Recent Labs  Lab 04/28/21 1111 04/28/21 2133 04/29/21 0503 04/30/21 0436 05/01/21 0433 05/02/21 0545  WBC 9.5  --  9.9 10.0 10.5 11.2*  HGB 8.8* 10.1* 10.4* 11.8* 11.5* 12.9  HCT 26.8* 30.9* 31.6* 35.4* 35.3* 38.2  MCV 95.7  --  94.9 93.9 94.6 92.7  PLT 212  --  249 321 324 346   Cardiac Enzymes: No results for input(s): CKTOTAL, CKMB, CKMBINDEX, TROPONINI in the last 168 hours. BNP (last 3 results) No results for input(s): BNP in the last 8760 hours.  ProBNP (last 3 results) No results for input(s): PROBNP in the last 8760 hours.  CBG: No results for input(s): GLUCAP in the last 168 hours.     Radiology Reports  CT ABDOMEN PELVIS WO CONTRAST  Result Date: 04/30/2021 CLINICAL DATA:  Nausea, vomiting. EXAM: CT ABDOMEN AND PELVIS WITHOUT CONTRAST TECHNIQUE:  Multidetector CT imaging of the abdomen and pelvis was performed following the standard protocol without IV contrast. COMPARISON:  April 27, 2021. FINDINGS: Lower chest: Bilateral pleural effusions are noted with adjacent bibasilar atelectasis. Hepatobiliary: No gallstones or biliary dilatation is noted. 10.7 x 7.5 cm hemorrhage is again noted involving the posterior and inferior aspect of the left hepatic lobe which appears to be enlarged compared to prior exam. Pancreas: Unremarkable. No pancreatic ductal dilatation or surrounding inflammatory changes. Spleen: Normal in size without focal abnormality. Adrenals/Urinary Tract: Adrenal glands are unremarkable. Kidneys are normal, without renal calculi, focal lesion, or hydronephrosis. Bladder is unremarkable. Stomach/Bowel: Stomach appears normal. There is no evidence of bowel obstruction or inflammation. Vascular/Lymphatic: Aortic atherosclerosis. No enlarged abdominal or pelvic lymph nodes. Reproductive:  Status post hysterectomy. No adnexal masses. Other: Decreased amount of pelvic hemorrhage is noted compared to prior exam. No hernia is noted. Musculoskeletal: No acute or significant osseous findings. IMPRESSION: Large hemorrhage is seen involving left hepatic lobe which appears to be slightly enlarged compared to prior exam, most likely due to hepatic laceration. There is a slightly decreased amount of hemoperitoneum seen in the pelvis compared to prior exam. Bilateral pleural effusions are again noted with adjacent bibasilar atelectasis. Electronically Signed   By: Marijo Conception M.D.   On: 04/30/2021 17:48       Antibiotics: Anti-infectives (From admission, onward)    Start     Dose/Rate Route Frequency Ordered Stop   04/29/21 1000  vancomycin (VANCOCIN) capsule 125 mg        125 mg Oral 4 times daily 04/29/21 0815     04/28/21 1430  metroNIDAZOLE (FLAGYL) tablet 500 mg  Status:  Discontinued        500 mg Oral Every 8 hours 04/28/21 1358  04/29/21 0945   04/25/21 1245  vancomycin (VANCOCIN) 50 mg/mL oral solution SOLN 125 mg  Status:  Discontinued        125 mg Oral Every 6 hours 04/25/21 1145 04/29/21 0815   04/20/21 1500  vancomycin (VANCOCIN) capsule 125 mg  Status:  Discontinued        125 mg Oral 4 times daily 04/20/21 1405 04/25/21 1145         DVT prophylaxis: SCDs  Code Status: Full code  Family Communication: Discussed with patient's sister at bedside   Consultants: Gastroenterology Intervention radiology  Oncology  Procedures:     Objective    Physical Examination:   General-appears in no acute distress Heart-S1-S2, regular, no murmur auscultated Lungs-clear to auscultation bilaterally, no wheezing or crackles auscultated Abdomen-soft, nontender, no organomegaly Extremities-no edema in the lower extremities Neuro-alert, oriented x3, no focal deficit noted  Status is: Inpatient  Dispo: The patient is from: Home              Anticipated d/c is to: Home              Anticipated d/c date is: 05/03/2021              Patient currently not stable for discharge  Barrier to discharge-ongoing diarrhea  COVID-19 Labs  No results for input(s): DDIMER, FERRITIN, LDH, CRP in the last 72 hours.  Lab Results  Component Value Date   [place] NEGATIVE 04/19/2021            No results found for this or any previous visit (from the past 240 hour(s)).  Oswald Hillock   [facility] If 7PM-7AM, please contact night-coverage at [email or web address], Office  [phone]   05/02/2021, 3:39 PM  LOS: 13 days

## 2021-05-02 NOTE — TOC Transition Note (Signed)
Transition of Care [facility]) - CM/SW Discharge Note   Patient Details  Name: Sara Barnes MRN: [id number] Date of Birth: 02/13/1941  Transition of Care [facility]) CM/SW Contact:  Dessa Phi, RN Phone Number: 05/02/2021, 2:40 PM   Clinical Narrative: Alvis Lemmings HHPT-rep [facility] aware. No further CM needs.      Final next level of care: [place] Barriers to Discharge: No Barriers Identified   Patient Goals and CMS Choice Patient states their goals for this hospitalization and ongoing recovery are:: go home CMS Medicare.gov Compare Post Acute Care list provided to:: Patient Represenative (must comment) Choice offered to / list presented to : Patient  Discharge Placement                       Discharge Plan and Services   Discharge Planning Services: CM Consult                      HH Arranged: PT [facility] Agency: [place] Date [place]: 05/02/21 Time [place]: 1440 Representative spoke with at [place]: [place]) Interventions     Readmission Risk Interventions No flowsheet data found.

## 2021-05-02 NOTE — Progress Notes (Signed)
Pt transferred up from 4th floor this evening.  Pt aox4 and pleasant with this staff member.  Sister at bedside with patient belongs.  Pt refusing alarms at this time, sister walking pt to the bathroom.  Enteric precautions maintained.  RN preformed focused assessment. VSS. IVF continued.

## 2021-05-02 NOTE — Care Management Important Message (Signed)
Important Message  Patient Details IM Letter placed in Patients room. Name: Sara Barnes MRN: [id number] Date of Birth: May 21, 1940   Medicare Important Message Given:  Yes     Kerin Salen 05/02/2021, 4:00 PM

## 2021-05-02 NOTE — Progress Notes (Signed)
[facility] Progress Note  CC:  C. Diff diarrhea, Crohn's disease   Subjective: She was difficulty to arouse this morning, easily drifting  back to sleep. She complains of having the "same chest pain and abdominal pain" since her admission, not worse. No SOB. She had 3 episodes of nonbloody diarrhea last night. She tells me she has something to tell me but cannot tell me right now. She stated she feels "really bad". Last received Dilaudid around 8 pm last night. No family at the bedside. RN Myriam Jacobson at bedside.   Objective:  Vital signs in last 24 hours: Temp:  [98 F (36.7 C)-98.1 F (36.7 C)] 98 F (36.7 C) (01/04 2031) Pulse Rate:  [75-85] 77 (01/05 0650) Resp:  [16-18] 16 (01/05 0650) BP: (122-136)/(75-83) 122/75 (01/05 0650) SpO2:  [93 %-99 %] 99 % (01/05 0650) Last BM Date: 05/01/21  General: Lethargic, somewhat difficult to arouse, drifts quickly back to sleep. Heart: Regular rate and rhythm, no murmurs. Pulm: Breath sounds clear, diminished in the bases. Abdomen: Soft, nondistended.  Moderate tenderness throughout the upper and lower abdomen without rebound or guarding.  Patchy erythematous/pink rash to the lower abdomen.  Positive bowel sounds to all 4 quadrants. Extremities: No lower extremity edema. Neurologic:  Alert and oriented to name, date and place.  She is lethargic but arousable as noted above.  She answers questions appropriately but also rambles a bit and tells me she has something to tell me but cannot talk about it right now. Psych:  Alert and cooperative. Normal mood and affect.  Intake/Output from previous day: 01/04 0701 - 01/05 0700 In: 327.1 [P.O.:240; IV Piggyback:87.1] Out: -  Intake/Output this shift: No intake/output data recorded.  Lab Results: Recent Labs    04/30/21 0436 05/01/21 0433 05/02/21 0545  WBC 10.0 10.5 11.2*  HGB 11.8* 11.5* 12.9  HCT 35.4* 35.3* 38.2  PLT 321 324 346   BMET Recent Labs    04/30/21 0436  05/01/21 0433 05/02/21 0545  NA 132* 133* 132*  K 3.6 3.1* 3.9  CL 99 97* 99  CO2 25 24 23   GLUCOSE 98 88 88  BUN 25* 31* 31*  CREATININE 1.24* 1.28* 1.14*  CALCIUM 9.2 9.1 9.1   LFT Recent Labs    05/02/21 0545  PROT 6.8  ALBUMIN 3.8  AST 20  ALT 15  ALKPHOS 85  BILITOT 0.8   PT/INR No results for input(s): LABPROT, INR in the last 72 hours. Hepatitis Panel No results for input(s): HEPBSAG, HCVAB, HEPAIGM, HEPBIGM in the last 72 hours.  CT ABDOMEN PELVIS WO CONTRAST  Result Date: 04/30/2021 CLINICAL DATA:  Nausea, vomiting. EXAM: CT ABDOMEN AND PELVIS WITHOUT CONTRAST TECHNIQUE: Multidetector CT imaging of the abdomen and pelvis was performed following the standard protocol without IV contrast. COMPARISON:  April 27, 2021. FINDINGS: Lower chest: Bilateral pleural effusions are noted with adjacent bibasilar atelectasis. Hepatobiliary: No gallstones or biliary dilatation is noted. 10.7 x 7.5 cm hemorrhage is again noted involving the posterior and inferior aspect of the left hepatic lobe which appears to be enlarged compared to prior exam. Pancreas: Unremarkable. No pancreatic ductal dilatation or surrounding inflammatory changes. Spleen: Normal in size without focal abnormality. Adrenals/Urinary Tract: Adrenal glands are unremarkable. Kidneys are normal, without renal calculi, focal lesion, or hydronephrosis. Bladder is unremarkable. Stomach/Bowel: Stomach appears normal. There is no evidence of bowel obstruction or inflammation. Vascular/Lymphatic: Aortic atherosclerosis. No enlarged abdominal or pelvic lymph nodes. Reproductive: Status post hysterectomy. No adnexal masses.  Other: Decreased amount of pelvic hemorrhage is noted compared to prior exam. No hernia is noted. Musculoskeletal: No acute or significant osseous findings. IMPRESSION: Large hemorrhage is seen involving left hepatic lobe which appears to be slightly enlarged compared to prior exam, most likely due to hepatic  laceration. There is a slightly decreased amount of hemoperitoneum seen in the pelvis compared to prior exam. Bilateral pleural effusions are again noted with adjacent bibasilar atelectasis. Electronically Signed   By: Marijo Conception M.D.   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 difficile started on Vanco and seems to be well initially but now diarrhea has continued. Repeat CT the abdomen and pelvis without contrast 05/01/2021 showed large hemorrhage involving the left hepatic lobe which appeared and slightly enlarged and bilateral pleural effusions, stomach appeared normal and there was no evidence of bowel obstruction or inflammation. Started on Lomotil 1/4. Continues to have generalized abdominal pain.  -Continue Vanco 132mpo qid -Stop Lomotil -Florastor probiotic once daily -Continue Prednisone 568mpo QD -Consider Cholestyramine 4 gm QD for diarrhea management -Possible flex sig if diarrhea worsens  -Await further recommendations per Dr. ArHavery Moros2) Altered mental status this morning, likely due to Dilaudid, Xanax and Lomotil  -Hospitalist Dr. LaDarrick Meigspdated regarding AMS, recommended stopping Xanax and Lomotil  -Monitor neuro status closely  2)  Nausea -Continue Pepcid 2050mV Q 24 hours   3)  Breast cancer with metastasis to the liver: Confirmed via liver biopsy this admission  4)  Acute anemia, thought due to hemoperitoneum/liver lac after liver biopsy, hemoglobin now stable. Hg 11.5 -> 12.9.   5) Hyponatremia, NA+ 132 -BMP in am  6) Chest pain, patient reported having the "same chest pain she's had since I was admitted to the hospital". Repeat vital  signs and oxygen saturations were stable. -Consider EKG today, defer to Dr. LamDarrick Meigs   Principal Problem:   Intractable nausea and vomiting Active Problems:   Depression   Diarrhea   Crohn disease ([place]CC[place] Hyponatremia   Clostridium difficile colitis     LOS: 13 days   ColNoralyn Pick/08/2021, 08:45AM

## 2021-05-02 NOTE — Progress Notes (Signed)
°   05/02/21 1200  Mobility  Activity Ambulated in room;Transferred:  Bed to chair  Level of Assistance Contact guard assist, steadying assist  Assistive Device None  Distance Ambulated (ft) 20 ft  Mobility Ambulated with assistance in room  Mobility Response Tolerated fair  Mobility performed by Mobility specialist  $Mobility charge 1 Mobility   CRN and NT present in room upon arrival, helping pt clean up. Pt stated she was very tired after the events that occurred prior to my arrival. She also stated she was feeling quite nauseated and weak. Pt agreed to sit up in the chair. Assisted pt to the chair. Left pt in chair with call bell at side and family present. RN notified.  [place] Specialist Acute Rehab Services Office: [phone]

## 2021-05-03 DIAGNOSIS — K50819 Crohn's disease of both small and large intestine with unspecified complications: Secondary | ICD-10-CM | POA: Diagnosis not present

## 2021-05-03 DIAGNOSIS — A0472 Enterocolitis due to Clostridium difficile, not specified as recurrent: Secondary | ICD-10-CM | POA: Diagnosis not present

## 2021-05-03 DIAGNOSIS — R197 Diarrhea, unspecified: Secondary | ICD-10-CM | POA: Diagnosis not present

## 2021-05-03 LAB — BASIC METABOLIC PANEL
Anion gap: 9 (ref 5–15)
BUN: 22 mg/dL (ref 8–23)
CO2: 19 mmol/L — ABNORMAL LOW (ref 22–32)
Calcium: 8.8 mg/dL — ABNORMAL LOW (ref 8.9–10.3)
Chloride: 105 mmol/L (ref 98–111)
Creatinine, Ser: 1.06 mg/dL — ABNORMAL HIGH (ref 0.44–1.00)
GFR, Estimated: 53 mL/min — ABNORMAL LOW (ref >60)
Glucose, Bld: 100 mg/dL — ABNORMAL HIGH (ref 70–99)
Potassium: 3.6 mmol/L (ref 3.5–5.1)
Sodium: 133 mmol/L — ABNORMAL LOW (ref 135–145)

## 2021-05-03 MED ORDER — DIPHENOXYLATE-ATROPINE 2.5-0.025 MG PO TABS
1.0000 | ORAL_TABLET | Freq: Four times a day (QID) | ORAL | Status: DC | PRN
  Administered 2021-05-04 – 2021-05-05 (×2): 1 via ORAL
  Filled 2021-05-03 (×2): qty 1

## 2021-05-03 MED ORDER — SODIUM CHLORIDE 0.9 % IV SOLN: INTRAVENOUS | Status: AC

## 2021-05-03 NOTE — Progress Notes (Signed)
Physical Therapy Treatment Patient Details Name: Sara Barnes MRN: [id number] DOB: Oct 09, 1940 Today's Date: 05/03/2021   History of Present Illness Patient is a 81 year old female who presented with nausea, vomitting and diarhea. Patient was admitted with intractable nausea and vomitting, C diff and dyspnea. Pt is s/p liver biopsy on 12/28 and CT abdomen shows post liver biopsy bleed which is causing her chest discomfort.  PMH: crohn's disease, R hemicolectomy, SBO, ileostomy revision.    PT Comments    General Comments: AxO x 3 pleasant and wity.  Has a very supportive younger sister who helps. General transfer comment: good safety cognition and use of hands to steady self.  Has been up/down multiple time to [facility] with sister. General Gait Details: tolerated an increased distance but required x 4 standing rest breaks.  "I'm not as strong as I use to be".  Pt stated she is undecided about any future Cancer Treatments.  Pt plans to return home with care from her sister.    Recommendations for follow up therapy are one component of a multi-disciplinary discharge planning process, led by the attending physician.  Recommendations may be updated based on patient status, additional functional criteria and insurance authorization.  Follow Up Recommendations  Home health PT     Assistance Recommended at Discharge Intermittent Supervision/Assistance  Patient can return home with the following Assistance with cooking/housework;Assist for transportation   Equipment Recommendations  None recommended by PT    Recommendations for Other Services       Precautions / Restrictions Precautions Precautions: Fall Precaution Comments: C - Diff Restrictions Weight Bearing Restrictions: No     Mobility  Bed Mobility Overal bed mobility: Modified Independent             General bed mobility comments: pt self able with increased time.    Transfers Overall transfer level: Needs  assistance Equipment used: None Transfers: Sit to/from Stand Sit to Stand: Supervision           General transfer comment: good safety cognition and use of hands to steady self.  Has been up/down multiple time to [facility] with sister.    Ambulation/Gait Ambulation/Gait assistance: Supervision Gait Distance (Feet): 145 Feet Assistive device: Rolling walker (2 wheels) Gait Pattern/deviations: Step-through pattern;Trunk flexed;Decreased stride length Gait velocity: WNL     General Gait Details: tolerated an increased distance but required x 4 standing rest breaks.  "I'm not as strong as I use to be".   Stairs             Wheelchair Mobility    Modified Rankin (Stroke Patients Only)       Balance                                            Cognition Arousal/Alertness: Awake/alert Behavior During Therapy: WFL for tasks assessed/performed Overall Cognitive Status: Within Functional Limits for tasks assessed                                 General Comments: AxO x 3 pleasant and wity.  Has a very supportive younger sister who helps.        Exercises      General Comments        Pertinent Vitals/Pain Pain Assessment: No/denies pain    Home Living  Prior Function            PT Goals (current goals can now be found in the care plan section) Progress towards PT goals: Progressing toward goals    Frequency    Min 3X/week      PT Plan Current plan remains appropriate    Co-evaluation              AM-PAC PT "6 Clicks" Mobility   Outcome Measure  Help needed turning from your back to your side while in a flat bed without using bedrails?: None Help needed moving from lying on your back to sitting on the side of a flat bed without using bedrails?: None Help needed moving to and from a bed to a chair (including a wheelchair)?: None Help needed standing up from a chair using your  arms (e.g., wheelchair or bedside chair)?: A Little Help needed to walk in hospital room?: A Little Help needed climbing 3-5 steps with a railing? : A Little 6 Click Score: 21    End of Session Equipment Utilized During Treatment: Gait belt Activity Tolerance: Patient limited by fatigue Patient left: in bed;with call bell/phone within reach;with family/visitor present Nurse Communication: Mobility status PT Visit Diagnosis: Difficulty in walking, not elsewhere classified (R26.2)     Time: 7680-8811 PT Time Calculation (min) (ACUTE ONLY): 27 min  Charges:  $Gait Training: 8-22 mins $Therapeutic Activity: 8-22 mins                     Rica Koyanagi  PTA Acute  Rehabilitation Services Pager      [phone] Office      [phone]

## 2021-05-03 NOTE — Progress Notes (Addendum)
[facility] Progress Note  CC:   C. Diff diarrhea, Crohn's disease   Subjective:  She is much more awake today. She passed 3 thicker brown stools this morning with a small amount of red streaks. No chest or abdominal pain. Sister is at the bedside.   Objective:  Vital signs in last 24 hours: Temp:  [97.8 F (36.6 C)-98.6 F (37 C)] 97.8 F (36.6 C) (01/06 0342) Pulse Rate:  [77-82] 77 (01/06 0342) Resp:  [16-20] 20 (01/06 0500) BP: (109-126)/(65-76) 126/76 (01/06 0342) SpO2:  [95 %-98 %] 97 % (01/06 0342) Last BM Date: 05/02/21 General:   Alert 81 year old female in NAD. Heart: RRR, no murmur.  Pulm: Breath sounds clear.  Abdomen: Soft, nondistended. Nontender. + BS x 4 quads. Midline and right abd scars intact.  Rectal: Patient declined rectal exam Extremities:  Without edema. Neurologic:  Alert and  oriented x 4. Grossly normal neurologically. Psych:  Alert and cooperative. Normal mood and affect.  Intake/Output from previous day: 01/05 0701 - 01/06 0700 In: 1570.5 [P.O.:240; I.V.:1230.5; IV Piggyback:100] Out: -  Intake/Output this shift: Total I/O In: 572.3 [P.O.:236; I.V.:336.3] Out: -   Lab Results: Recent Labs    05/01/21 0433 05/02/21 0545  WBC 10.5 11.2*  HGB 11.5* 12.9  HCT 35.3* 38.2  PLT 324 346   BMET Recent Labs    05/01/21 0433 05/02/21 0545 05/03/21 0534  NA 133* 132* 133*  K 3.1* 3.9 3.6  CL 97* 99 105  CO2 24 23 19*  GLUCOSE 88 88 100*  BUN 31* 31* 22  CREATININE 1.28* 1.14* 1.06*  CALCIUM 9.1 9.1 8.8*   LFT Recent Labs    05/02/21 0545  PROT 6.8  ALBUMIN 3.8  AST 20  ALT 15  ALKPHOS 85  BILITOT 0.8   PT/INR No results for input(s): LABPROT, INR in the last 72 hours. Hepatitis Panel No results for input(s): HEPBSAG, HCVAB, HEPAIGM, HEPBIGM in the last 72 hours.  No results found.  Assessment / Plan:  1) Longstanding complicated Crohn's disease with severe perianal disease s/p cecal perforation in  2019, s/p right hemicolectomy and end ileostomy with reversal in June 2020. On chronic low-dose Prednisone 5 mg, Lomotil and Imodium as outpatient. Admitted to the hospital 12/23 with worsening diarrhea. Positive C. Diff PCR and C. Diff antigen. Started on Vanco, diarrhea initially improved then worsened. Repeat CTAP wo contrast 04/30/2021 showed large hemorrhage involving the left hepatic lobe (s/p liver biopsy 12/28), slightly enlarged bilateral pleural effusions, stomach appeared normal and there was no evidence of a bowel obstruction or inflammatory process. Started on Lomotil 1/4. Started on Cholestyramine and Florastor 1/5. She refused several doses of Vancomycin, per pharmacy her 14th day of treatment (when taking into account several skipped doses) will be on 05/07/2021. Patient reported stool is thicker with some blood streaks today. No abdominal pain today.  -Continue Vanco 165mpo qid until 05/07/2021 -Florastor probiotic once daily -Continue Prednisone 522mpo QD -Continue Cholestyramine 4 gm QD for diarrhea management -No plans for flex sig at this time -Diet as tolerated -Await further recommendations per Dr. ArHavery Moros 2) Altered mental status 05/02/2021, likely due to Dilaudid, Xanax and Lomotil which have been discontinued. Mental status back to baseline today.   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 Hg 11.5 -> 12.9.  -CBC in am    6) Hyponatremia, NA+ 132 -> 133. -BMP in am   7) Chest pain, patient reported having the "same chest pain she's had since I was admitted to the hospital" on 05/02/2021. Repeat vital signs and oxygen saturations were stable. EKG 05/02/2021 without acute ischemia. Troponin 29 -> 21. No CP at this time.       Principal Problem:    Intractable nausea and vomiting Active Problems:   Depression   Diarrhea   Crohn disease (HCC)   Hyponatremia   Clostridium difficile colitis     LOS: 14 days   Sara Barnes  05/03/2021, 11:28AM

## 2021-05-03 NOTE — Progress Notes (Signed)
Patient is s/p liver biopsy on 87/19, complicated by post-procedure hematoma and anemia.   IR has been following the patient, chart checked today and it appears that she is hemodynamically stable.   IR will sign off, please call for questions and concerns.  Armando Gang Corina Stacy PA-C 05/03/2021 11:10 AM

## 2021-05-03 NOTE — Progress Notes (Signed)
[facility]  PROGRESS NOTE  Sara Barnes OEH:[id number] DOB: April 18, 1941 DOA: 04/19/2021 PCP: Ann Held, DO   Brief HPI:   81 year old female with past medical history of Crohn's disease, depression, insomnia, metastatic breast cancer, with mets to chest wall that has been excised, dyspepsia admitted with C. difficile colitis with complaints of nausea, vomiting and diarrhea.  She s/p biopsy of liver.  Post biopsy patient developed liver hematoma.  She has ongoing diarrhea.    Subjective   Patient feels better today, still has ongoing diarrhea but improved.   Assessment/Plan:    C. difficile colitis/diarrhea/Crohn's disease -Patient was diagnosed with C. difficile colitis -Started on p.o. vancomycin -Diarrhea initially improved now again worsened since last night -Called and discussed with ID, ID does not feel that addition of Flagyl or Dificid would help -She has a history of Crohn's disease and had bloody bowel movement this morning -Gastroenterology was consulted, recommended stopping Lomotil and trying cholestyramine -Lomotil was discontinued and patient is currently on cholestyramine  -Continue vancomycin, as it was stopped for 4 days.  New stop date is 05/07/2021.    S/p liver biopsy/hematoma -CT abdomen/pelvis shows hematoma s/p liver biopsy -General surgery and IR were consulted -General surgery has signed off -IR has signed off   History of metastatic breast cancer -With mets to chest wall that has been excised -CT abdomen/pelvis showed new hepatic lesions suspicious for metastatic cancer -She s/p liver biopsy on 04/24/2021 -Biopsy results confirmed metastatic breast cancer -Oncology, Dr. Annamaria Boots is aware and will see patient as outpatient  Hypokalemia - replete   Acute kidney injury -Resolved with IV hydration -Renal ultrasound shows no evidence of hydronephrosis  Anemia from acute blood loss -Secondary to liver laceration since liver  biopsy -Patient received 1 unit PRBC -Hemoglobin is stable at 11.5 -Lovenox was stopped  Transient atrial fibrillation -Likely from ongoing diarrhea, electrolyte imbalance, anemia -Patient had 2 episodes of transient atrial fibrillation during hospital stay -Continue as needed metoprolol for rate control -Patient is not a candidate for anticoagulation due to intra-abdominal hematoma -Echocardiogram showed normal EF -TSH normal  Chest pain -Patient has been having intermittent chest pain, though she also complains of epigastric discomfort with cramping abdominal pain along with these episodes -EKG obtained this morning was unremarkable -Troponin 29, 21 -Doubt cardiac cause of chest pain, likely chest pain is due to the nausea, abdominal cramping  Medications     (feeding supplement) PROSource Plus  30 mL Oral BID BM   cholestyramine light  4 g Oral Q24H   diclofenac Sodium  2 g Topical QID   feeding supplement  1 Container Oral TID BM   FLUoxetine  20 mg Oral Daily   metoprolol tartrate  12.5 mg Oral BID   multivitamin with minerals  1 tablet Oral Daily   predniSONE  5 mg Oral Q breakfast   saccharomyces boulardii  250 mg Oral BID   vancomycin  125 mg Oral QID     Data Reviewed:   CBG:  No results for input(s): GLUCAP in the last 168 hours.  SpO2: 97 % O2 Flow Rate (L/min): 1 L/min    Vitals:   05/02/21 2300 05/03/21 0342 05/03/21 0446 05/03/21 0500  BP:  126/76    Pulse:  77    Resp: 20 16 16 20   Temp:  97.8 F (36.6 C)    TempSrc:  Oral    SpO2:  97%    Weight:      Height:  Intake/Output Summary (Last 24 hours) at 05/03/2021 1354 Last data filed at 05/03/2021 1005 Gross per 24 hour  Intake 1902.83 ml  Output --  Net 1902.83 ml    01/04 1901 - 01/06 0700 In: 1570.5 [P.O.:240; I.V.:1230.5] Out: -   Filed Weights   04/19/21 2130  Weight: 72.1 kg    Data Reviewed: Basic Metabolic Panel: Recent Labs  Lab 04/28/21 0517 04/28/21 0521  04/29/21 0503 04/30/21 0436 05/01/21 0433 05/02/21 0545 05/03/21 0534  NA  --    < > 132* 132* 133* 132* 133*  K  --    < > 3.8 3.6 3.1* 3.9 3.6  CL  --    < > 101 99 97* 99 105  CO2  --    < > 22 25 24 23  19*  GLUCOSE  --    < > 94 98 88 88 100*  BUN  --    < > 21 25* 31* 31* 22  CREATININE  --    < > 1.17* 1.24* 1.28* 1.14* 1.06*  CALCIUM  --    < > 9.2 9.2 9.1 9.1 8.8*  MG 2.1  --   --   --   --   --   --    < > = values in this interval not displayed.   Liver Function Tests: Recent Labs  Lab 04/29/21 0503 04/30/21 0436 05/01/21 0433 05/02/21 0545  AST 17 18 18 20   ALT 13 15 13 15   ALKPHOS 82 88 78 85  BILITOT 1.3* 1.1 0.9 0.8  PROT 6.6 6.6 6.7 6.8  ALBUMIN 4.0 3.8 3.7 3.8   No results for input(s): LIPASE, AMYLASE in the last 168 hours. No results for input(s): AMMONIA in the last 168 hours. CBC: Recent Labs  Lab 04/28/21 1111 04/28/21 2133 04/29/21 0503 04/30/21 0436 05/01/21 0433 05/02/21 0545  WBC 9.5  --  9.9 10.0 10.5 11.2*  HGB 8.8* 10.1* 10.4* 11.8* 11.5* 12.9  HCT 26.8* 30.9* 31.6* 35.4* 35.3* 38.2  MCV 95.7  --  94.9 93.9 94.6 92.7  PLT 212  --  249 321 324 346   Cardiac Enzymes: No results for input(s): CKTOTAL, CKMB, CKMBINDEX, TROPONINI in the last 168 hours. BNP (last 3 results) No results for input(s): BNP in the last 8760 hours.  ProBNP (last 3 results) No results for input(s): PROBNP in the last 8760 hours.  CBG: No results for input(s): GLUCAP in the last 168 hours.     Radiology Reports  No results found.     Antibiotics: Anti-infectives (From admission, onward)    Start     Dose/Rate Route Frequency Ordered Stop   04/29/21 1000  vancomycin (VANCOCIN) capsule 125 mg        125 mg Oral 4 times daily 04/29/21 0815 05/07/21 2359   04/28/21 1430  metroNIDAZOLE (FLAGYL) tablet 500 mg  Status:  Discontinued        500 mg Oral Every 8 hours 04/28/21 1358 04/29/21 0945   04/25/21 1245  vancomycin (VANCOCIN) 50 mg/mL oral  solution SOLN 125 mg  Status:  Discontinued        125 mg Oral Every 6 hours 04/25/21 1145 04/29/21 0815   04/20/21 1500  vancomycin (VANCOCIN) capsule 125 mg  Status:  Discontinued        125 mg Oral 4 times daily 04/20/21 1405 04/25/21 1145         DVT prophylaxis: SCDs  Code Status: Full code  Family Communication: Discussed  with patient's sister at bedside   Consultants: Gastroenterology Intervention radiology  Oncology  Procedures:     Objective    Physical Examination:   General-appears in no acute distress Heart-S1-S2, regular, no murmur auscultated Lungs-clear to auscultation bilaterally, no wheezing or crackles auscultated Abdomen-soft, mild epigastric tenderness to palpation ,  no organomegaly Extremities-no edema in the lower extremities Neuro-alert, oriented x3, no focal deficit noted  Status is: Inpatient  Dispo: The patient is from: Home              Anticipated d/c is to: Home              Anticipated d/c date is: 05/03/2021              Patient currently not stable for discharge  Barrier to discharge-ongoing diarrhea  COVID-19 Labs  No results for input(s): DDIMER, FERRITIN, LDH, CRP in the last 72 hours.  Lab Results  Component Value Date   [place] NEGATIVE 04/19/2021            No results found for this or any previous visit (from the past 240 hour(s)).  Oswald Hillock   [facility] If 7PM-7AM, please contact night-coverage at [email or web address], Office  [phone]   05/03/2021, 1:54 PM  LOS: 14 days

## 2021-05-03 NOTE — Plan of Care (Signed)
Pt aox4, pleasant and cooperative with staff.  +Cdiff, on PO Vanc, antidiarrheals per orders. GI following.  PT/OT consulted.  Isolation/fall precautions maintained.   Problem: Education: Goal: Knowledge of General Education information will improve Description: Including pain rating scale, medication(s)/side effects and non-pharmacologic comfort measures Outcome: Progressing   Problem: Health Behavior/Discharge Planning: Goal: Ability to manage health-related needs will improve Outcome: Progressing   Problem: Clinical Measurements: Goal: Ability to maintain clinical measurements within normal limits will improve Outcome: Progressing Goal: Will remain free from infection Outcome: Progressing Goal: Diagnostic test results will improve Outcome: Progressing Goal: Respiratory complications will improve Outcome: Progressing Goal: Cardiovascular complication will be avoided Outcome: Progressing   Problem: Activity: Goal: Risk for activity intolerance will decrease Outcome: Progressing   Problem: Nutrition: Goal: Adequate nutrition will be maintained Outcome: Progressing   Problem: Coping: Goal: Level of anxiety will decrease Outcome: Progressing   Problem: Elimination: Goal: Will not experience complications related to bowel motility Outcome: Progressing Goal: Will not experience complications related to urinary retention Outcome: Progressing   Problem: Pain Managment: Goal: General experience of comfort will improve Outcome: Progressing   Problem: Safety: Goal: Ability to remain free from injury will improve Outcome: Progressing   Problem: Skin Integrity: Goal: Risk for impaired skin integrity will decrease Outcome: Progressing

## 2021-05-04 DIAGNOSIS — A0472 Enterocolitis due to Clostridium difficile, not specified as recurrent: Secondary | ICD-10-CM | POA: Diagnosis not present

## 2021-05-04 DIAGNOSIS — K50819 Crohn's disease of both small and large intestine with unspecified complications: Secondary | ICD-10-CM | POA: Diagnosis not present

## 2021-05-04 DIAGNOSIS — R197 Diarrhea, unspecified: Secondary | ICD-10-CM | POA: Diagnosis not present

## 2021-05-04 DIAGNOSIS — R112 Nausea with vomiting, unspecified: Secondary | ICD-10-CM | POA: Diagnosis not present

## 2021-05-04 LAB — BASIC METABOLIC PANEL
Anion gap: 7 (ref 5–15)
BUN: 14 mg/dL (ref 8–23)
CO2: 21 mmol/L — ABNORMAL LOW (ref 22–32)
Calcium: 8.8 mg/dL — ABNORMAL LOW (ref 8.9–10.3)
Chloride: 106 mmol/L (ref 98–111)
Creatinine, Ser: 0.9 mg/dL (ref 0.44–1.00)
GFR, Estimated: >60 mL/min (ref >60)
Glucose, Bld: 93 mg/dL (ref 70–99)
Potassium: 3.8 mmol/L (ref 3.5–5.1)
Sodium: 134 mmol/L — ABNORMAL LOW (ref 135–145)

## 2021-05-04 LAB — CBC
HCT: 33.9 % — ABNORMAL LOW (ref 36.0–46.0)
Hemoglobin: 11 g/dL — ABNORMAL LOW (ref 12.0–15.0)
MCH: 31 pg (ref 26.0–34.0)
MCHC: 32.4 g/dL (ref 30.0–36.0)
MCV: 95.5 fL (ref 80.0–100.0)
Platelets: 346 10*3/uL (ref 150–400)
RBC: 3.55 MIL/uL — ABNORMAL LOW (ref 3.87–5.11)
RDW: 12.1 % (ref 11.5–15.5)
WBC: 13.3 10*3/uL — ABNORMAL HIGH (ref 4.0–10.5)
nRBC: 0 % (ref 0.0–0.2)

## 2021-05-04 MED ORDER — ONDANSETRON 4 MG PO TBDP
8.0000 mg | ORAL_TABLET | Freq: Three times a day (TID) | ORAL | Status: DC | PRN
  Administered 2021-05-04 – 2021-05-16 (×14): 8 mg via ORAL
  Filled 2021-05-04 (×16): qty 2

## 2021-05-04 MED ORDER — ONDANSETRON HCL 4 MG PO TABS
4.0000 mg | ORAL_TABLET | Freq: Once | ORAL | Status: AC
  Administered 2021-05-04: 4 mg via ORAL
  Filled 2021-05-04: qty 1
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
## 2021-05-04 NOTE — Progress Notes (Addendum)
[facility]  PROGRESS NOTE  NYSHA KOPLIN YBW:[id number] DOB: 17-Oct-1940 DOA: 04/19/2021 PCP: Ann Held, DO   Brief HPI:   81 year old female with past medical history of Crohn's disease, depression, insomnia, metastatic breast cancer, with mets to chest wall that has been excised, dyspepsia admitted with C. difficile colitis with complaints of nausea, vomiting and diarrhea.  She s/p biopsy of liver.  Post biopsy patient developed liver hematoma.  She has ongoing diarrhea.    Subjective   Patient continues to have liquid stools.  Lost IV access this morning.  IV team was consulted and IV has been placed with help of ultrasound.   Assessment/Plan:    C. difficile colitis/diarrhea/Crohn's disease -Patient was diagnosed with C. difficile colitis -Started on p.o. vancomycin -Diarrhea initially improved now again worsened since last night -Called and discussed with ID, ID does not feel that addition of Flagyl or Dificid would help -She has a history of Crohn's disease and had bloody bowel movement this morning -Gastroenterology was consulted, recommended stopping Lomotil and trying cholestyramine -Lomotil was discontinued and patient is currently on cholestyramine  -We will add her back on Lomotil 1 tablet p.o. every 6 hours as needed for liquid stools -Continue vancomycin, as it was stopped for 4 days.  New stop date is 05/07/2021.    S/p liver biopsy/hematoma -CT abdomen/pelvis shows hematoma s/p liver biopsy -General surgery and IR were consulted -General surgery has signed off -IR has signed off  Intractable nausea -Patient stated she was taking Zofran at home without any problem -We will discontinue Phenergan -Started on Zofran ODT 8 mg p.o. every 8 hours as needed  History of metastatic breast cancer -With mets to chest wall that has been excised -CT abdomen/pelvis showed new hepatic lesions suspicious for metastatic cancer -She s/p liver biopsy on  04/24/2021 -Biopsy results confirmed metastatic breast cancer -Oncology, Dr. Annamaria Boots is aware and will see patient as outpatient  Hypokalemia - replete   Acute kidney injury -Resolved with IV hydration -Renal ultrasound shows no evidence of hydronephrosis  Anemia from acute blood loss -Secondary to liver laceration since liver biopsy -Patient received 1 unit PRBC -Hemoglobin is stable at 11.5 -Lovenox was stopped  Transient atrial fibrillation -Likely from ongoing diarrhea, electrolyte imbalance, anemia -Patient had 2 episodes of transient atrial fibrillation during hospital stay -Continue as needed metoprolol for rate control -Patient is not a candidate for anticoagulation due to intra-abdominal hematoma -Echocardiogram showed normal EF -TSH normal  Chest pain -Patient has been having intermittent chest pain, though she also complains of epigastric discomfort with cramping abdominal pain along with these episodes -EKG obtained this morning was unremarkable -Troponin 29, 21 -Doubt cardiac cause of chest pain, likely chest pain is due to the nausea, abdominal cramping  Medications     cholestyramine light  4 g Oral Q24H   diclofenac Sodium  2 g Topical QID   FLUoxetine  20 mg Oral Daily   metoprolol tartrate  12.5 mg Oral BID   multivitamin with minerals  1 tablet Oral Daily   predniSONE  5 mg Oral Q breakfast   saccharomyces boulardii  250 mg Oral BID   vancomycin  125 mg Oral QID     Data Reviewed:   CBG:  No results for input(s): GLUCAP in the last 168 hours.  SpO2: 99 % O2 Flow Rate (L/min): 2 L/min    Vitals:   05/03/21 1816 05/03/21 1946 05/04/21 0526 05/04/21 1414  BP:  115/65 122/70 139/67  Pulse:  75 72 78  Resp:  18 20 18   Temp:  97.8 F (36.6 C) 97.6 F (36.4 C) (!) 97.5 F (36.4 C)  TempSrc:  Oral Oral Oral  SpO2: 98% 97% 100% 99%  Weight:      Height:         Intake/Output Summary (Last 24 hours) at 05/04/2021 1542 Last data filed at  05/04/2021 1100 Gross per 24 hour  Intake 120 ml  Output --  Net 120 ml    01/05 1901 - 01/07 0700 In: 1565.8 [P.O.:236; I.V.:1279.8] Out: -   Filed Weights   04/19/21 2130  Weight: 72.1 kg    Data Reviewed: Basic Metabolic Panel: Recent Labs  Lab 04/28/21 0517 04/28/21 0521 04/30/21 0436 05/01/21 0433 05/02/21 0545 05/03/21 0534 05/04/21 0552  NA  --    < > 132* 133* 132* 133* 134*  K  --    < > 3.6 3.1* 3.9 3.6 3.8  CL  --    < > 99 97* 99 105 106  CO2  --    < > 25 24 23  19* 21*  GLUCOSE  --    < > 98 88 88 100* 93  BUN  --    < > 25* 31* 31* 22 14  CREATININE  --    < > 1.24* 1.28* 1.14* 1.06* 0.90  CALCIUM  --    < > 9.2 9.1 9.1 8.8* 8.8*  MG 2.1  --   --   --   --   --   --    < > = values in this interval not displayed.   Liver Function Tests: Recent Labs  Lab 04/29/21 0503 04/30/21 0436 05/01/21 0433 05/02/21 0545  AST 17 18 18 20   ALT 13 15 13 15   ALKPHOS 82 88 78 85  BILITOT 1.3* 1.1 0.9 0.8  PROT 6.6 6.6 6.7 6.8  ALBUMIN 4.0 3.8 3.7 3.8   No results for input(s): LIPASE, AMYLASE in the last 168 hours. No results for input(s): AMMONIA in the last 168 hours. CBC: Recent Labs  Lab 04/29/21 0503 04/30/21 0436 05/01/21 0433 05/02/21 0545 05/04/21 0552  WBC 9.9 10.0 10.5 11.2* 13.3*  HGB 10.4* 11.8* 11.5* 12.9 11.0*  HCT 31.6* 35.4* 35.3* 38.2 33.9*  MCV 94.9 93.9 94.6 92.7 95.5  PLT 249 321 324 346 346   Cardiac Enzymes: No results for input(s): CKTOTAL, CKMB, CKMBINDEX, TROPONINI in the last 168 hours. BNP (last 3 results) No results for input(s): BNP in the last 8760 hours.  ProBNP (last 3 results) No results for input(s): PROBNP in the last 8760 hours.  CBG: No results for input(s): GLUCAP in the last 168 hours.     Radiology Reports  No results found.     Antibiotics: Anti-infectives (From admission, onward)    Start     Dose/Rate Route Frequency Ordered Stop   04/29/21 1000  vancomycin (VANCOCIN) capsule 125 mg         125 mg Oral 4 times daily 04/29/21 0815 05/07/21 2359   04/28/21 1430  metroNIDAZOLE (FLAGYL) tablet 500 mg  Status:  Discontinued        500 mg Oral Every 8 hours 04/28/21 1358 04/29/21 0945   04/25/21 1245  vancomycin (VANCOCIN) 50 mg/mL oral solution SOLN 125 mg  Status:  Discontinued        125 mg Oral Every 6 hours 04/25/21 1145 04/29/21 0815   04/20/21 1500  vancomycin (VANCOCIN) capsule 125  mg  Status:  Discontinued        125 mg Oral 4 times daily 04/20/21 1405 04/25/21 1145         DVT prophylaxis: SCDs  Code Status: Full code  Family Communication: Discussed with patient's sister at bedside   Consultants: Gastroenterology Intervention radiology  Oncology  Procedures:     Objective    Physical Examination:   General-appears in no acute distress Heart-S1-S2, regular, no murmur auscultated Lungs-clear to auscultation bilaterally, no wheezing or crackles auscultated Abdomen-soft, nontender, no organomegaly Extremities-no edema in the lower extremities Neuro-alert, oriented x3, no focal deficit noted  Status is: Inpatient  Dispo: The patient is from: Home              Anticipated d/c is to: Home              Anticipated d/c date is: 05/07/2021              Patient currently not stable for discharge  Barrier to discharge-ongoing diarrhea  COVID-19 Labs  No results for input(s): DDIMER, FERRITIN, LDH, CRP in the last 72 hours.  Lab Results  Component Value Date   [place] NEGATIVE 04/19/2021            No results found for this or any previous visit (from the past 240 hour(s)).  Oswald Hillock   [facility] If 7PM-7AM, please contact night-coverage at [email or web address], Office  [phone]   05/04/2021, 3:42 PM  LOS: 15 days

## 2021-05-05 DIAGNOSIS — R112 Nausea with vomiting, unspecified: Secondary | ICD-10-CM | POA: Diagnosis not present

## 2021-05-05 DIAGNOSIS — R197 Diarrhea, unspecified: Secondary | ICD-10-CM | POA: Diagnosis not present

## 2021-05-05 DIAGNOSIS — A0472 Enterocolitis due to Clostridium difficile, not specified as recurrent: Secondary | ICD-10-CM | POA: Diagnosis not present

## 2021-05-05 DIAGNOSIS — K5010 Crohn's disease of large intestine without complications: Secondary | ICD-10-CM | POA: Diagnosis not present

## 2021-05-05 MED ORDER — DIPHENOXYLATE-ATROPINE 2.5-0.025 MG PO TABS
2.0000 | ORAL_TABLET | Freq: Four times a day (QID) | ORAL | Status: DC | PRN
  Administered 2021-05-05 – 2021-05-06 (×2): 2 via ORAL
  Filled 2021-05-05 (×2): qty 2

## 2021-05-05 MED ORDER — HYDROMORPHONE HCL 2 MG PO TABS
2.0000 mg | ORAL_TABLET | ORAL | Status: DC | PRN
  Administered 2021-05-05 – 2021-05-16 (×48): 2 mg via ORAL
  Filled 2021-05-05 (×48): qty 1

## 2021-05-05 NOTE — Progress Notes (Signed)
Physical Therapy Treatment Patient Details Name: Sara Barnes MRN: [id number] DOB: [birth year] Today's Date: 05/05/2021   History of Present Illness Patient is a 81 year old female who presented with nausea, vomitting and diarhea. Patient was admitted with intractable nausea and vomitting, C diff and dyspnea. Pt is s/p liver biopsy on 12/28 and CT abdomen shows post liver biopsy bleed which is causing her chest discomfort.  PMH: crohn's disease, R hemicolectomy, SBO, ileostomy revision.    PT Comments    Pt making good progress and added step goal as pt states she has a "chopped" up home and has a flight as well as a sunken den and bedroom.  Will continue to work on decreasing reliance on RW.   Recommendations for follow up therapy are one component of a multi-disciplinary discharge planning process, led by the attending physician.  Recommendations may be updated based on patient status, additional functional criteria and insurance authorization.  Follow Up Recommendations  Home health PT     Assistance Recommended at Discharge Intermittent Supervision/Assistance  Patient can return home with the following Assistance with cooking/housework;Assist for transportation   Equipment Recommendations  None recommended by PT    Recommendations for Other Services       Precautions / Restrictions Precautions Precautions: Fall Precaution Comments: C - Diff     Mobility  Bed Mobility Overal bed mobility: Modified Independent                  Transfers Overall transfer level: Modified independent Equipment used: Rolling walker (2 wheels)                    Ambulation/Gait Ambulation/Gait assistance: Supervision Gait Distance (Feet): 220 Feet Assistive device: Rolling walker (2 wheels) Gait Pattern/deviations: Step-through pattern;Decreased stride length Gait velocity: WNL     General Gait Details: required one standing rest break and noted some fatigue with talking  while walking.   Stairs             Wheelchair Mobility    Modified Rankin (Stroke Patients Only)       Balance           Standing balance support: Bilateral upper extremity supported Standing balance-Leahy Scale: Fair                              Cognition Arousal/Alertness: Awake/alert Behavior During Therapy: WFL for tasks assessed/performed Overall Cognitive Status: Within Functional Limits for tasks assessed                                          Exercises      General Comments        Pertinent Vitals/Pain Pain Assessment: No/denies pain    Home Living                          Prior Function            PT Goals (current goals can now be found in the care plan section) Acute Rehab PT Goals PT Goal Formulation: With patient Time For Goal Achievement: 05/19/21 Potential to Achieve Goals: Good Progress towards PT goals: Goals met and updated - see care plan    Frequency    Min 3X/week      PT Plan Current plan remains  appropriate    Co-evaluation              AM-PAC PT "6 Clicks" Mobility   Outcome Measure  Help needed turning from your back to your side while in a flat bed without using bedrails?: None Help needed moving from lying on your back to sitting on the side of a flat bed without using bedrails?: None Help needed moving to and from a bed to a chair (including a wheelchair)?: None Help needed standing up from a chair using your arms (e.g., wheelchair or bedside chair)?: A Little Help needed to walk in hospital room?: A Little Help needed climbing 3-5 steps with a railing? : A Little 6 Click Score: 21    End of Session     Patient left: Other (comment) (on toilet with sister assisting)   PT Visit Diagnosis: Difficulty in walking, not elsewhere classified (R26.2)     Time: 1207-1216 PT Time Calculation (min) (ACUTE ONLY): 9 min  Charges:  $Gait Training: 8-22 mins                      Timi Reeser L. Tamala Julian, [place]  05/05/2021    Galen Manila 05/05/2021, 1:08 PM

## 2021-05-05 NOTE — Progress Notes (Signed)
[facility]  PROGRESS NOTE  Sara Barnes ZJQ:[id number] DOB: 01-07-1941 DOA: 04/19/2021 PCP: Ann Held, DO   Brief HPI:   81 year old female with past medical history of Crohn's disease, depression, insomnia, metastatic breast cancer, with mets to chest wall that has been excised, dyspepsia admitted with C. difficile colitis with complaints of nausea, vomiting and diarrhea.  She s/p biopsy of liver.  Post biopsy patient developed liver hematoma.  She has ongoing diarrhea.    Subjective   Patient seen, still has diarrhea.  Wants to increase the dose of Lomotil to 2 tablets every 6 hours.   Assessment/Plan:    C. difficile colitis/diarrhea/Crohn's disease -Patient was diagnosed with C. difficile colitis -Started on p.o. vancomycin -Diarrhea initially improved now again worsened since last night -Called and discussed with ID, ID does not feel that addition of Flagyl or Dificid would help -She has a history of Crohn's disease and had bloody bowel movement this morning -Gastroenterology was consulted, recommended stopping Lomotil and trying cholestyramine -Lomotil was discontinued and patient is currently on cholestyramine  -We will increase the dose of Lomotil to 2 tablets every 6 hours as needed for liquid stools -Continue vancomycin, as it was stopped for 4 days.  New stop date is 05/07/2021.    S/p liver biopsy/hematoma -CT abdomen/pelvis shows hematoma s/p liver biopsy -General surgery and IR were consulted -General surgery has signed off -IR has signed off  Intractable nausea -Patient stated she was taking Zofran at home without any problem -We will discontinue Phenergan -Started on Zofran ODT 8 mg p.o. every 8 hours as needed  History of metastatic breast cancer -With mets to chest wall that has been excised -CT abdomen/pelvis showed new hepatic lesions suspicious for metastatic cancer -She s/p liver biopsy on 04/24/2021 -Biopsy results confirmed  metastatic breast cancer -Oncology, Dr. Annamaria Boots is aware and will see patient as outpatient  Hypokalemia - replete   Acute kidney injury -Resolved with IV hydration -Renal ultrasound shows no evidence of hydronephrosis  Anemia from acute blood loss -Secondary to liver laceration since liver biopsy -Patient received 1 unit PRBC -Hemoglobin is stable at 11.5 -Lovenox was stopped  Transient atrial fibrillation -Likely from ongoing diarrhea, electrolyte imbalance, anemia -Patient had 2 episodes of transient atrial fibrillation during hospital stay -Continue as needed metoprolol for rate control -Patient is not a candidate for anticoagulation due to intra-abdominal hematoma -Echocardiogram showed normal EF -TSH normal  Chest pain -Patient has been having intermittent chest pain, though she also complains of epigastric discomfort with cramping abdominal pain along with these episodes -EKG obtained this morning was unremarkable -Troponin 29, 21 -Doubt cardiac cause of chest pain, likely chest pain is due to the nausea, abdominal cramping  Medications     cholestyramine light  4 g Oral Q24H   diclofenac Sodium  2 g Topical QID   FLUoxetine  20 mg Oral Daily   metoprolol tartrate  12.5 mg Oral BID   multivitamin with minerals  1 tablet Oral Daily   predniSONE  5 mg Oral Q breakfast   saccharomyces boulardii  250 mg Oral BID   vancomycin  125 mg Oral QID     Data Reviewed:   CBG:  No results for input(s): GLUCAP in the last 168 hours.  SpO2: 96 % O2 Flow Rate (L/min): 2 L/min    Vitals:   05/04/21 0526 05/04/21 1414 05/04/21 1935 05/05/21 0306  BP: 122/70 139/67 (!) 166/54 (!) 123/55  Pulse: 72 78 78 67  Resp: 20 18 16 18   Temp: 97.6 F (36.4 C) (!) 97.5 F (36.4 C) 98.2 F (36.8 C) 97.7 F (36.5 C)  TempSrc: Oral Oral Oral Oral  SpO2: 100% 99% 98% 96%  Weight:      Height:         Intake/Output Summary (Last 24 hours) at 05/05/2021 1650 Last data filed at  05/05/2021 0900 Gross per 24 hour  Intake 240 ml  Output --  Net 240 ml    01/06 1901 - 01/08 0700 In: 120 [P.O.:120] Out: -   Filed Weights   04/19/21 2130  Weight: 72.1 kg    Data Reviewed: Basic Metabolic Panel: Recent Labs  Lab 04/30/21 0436 05/01/21 0433 05/02/21 0545 05/03/21 0534 05/04/21 0552  NA 132* 133* 132* 133* 134*  K 3.6 3.1* 3.9 3.6 3.8  CL 99 97* 99 105 106  CO2 25 24 23  19* 21*  GLUCOSE 98 88 88 100* 93  BUN 25* 31* 31* 22 14  CREATININE 1.24* 1.28* 1.14* 1.06* 0.90  CALCIUM 9.2 9.1 9.1 8.8* 8.8*   Liver Function Tests: Recent Labs  Lab 04/29/21 0503 04/30/21 0436 05/01/21 0433 05/02/21 0545  AST 17 18 18 20   ALT 13 15 13 15   ALKPHOS 82 88 78 85  BILITOT 1.3* 1.1 0.9 0.8  PROT 6.6 6.6 6.7 6.8  ALBUMIN 4.0 3.8 3.7 3.8   No results for input(s): LIPASE, AMYLASE in the last 168 hours. No results for input(s): AMMONIA in the last 168 hours. CBC: Recent Labs  Lab 04/29/21 0503 04/30/21 0436 05/01/21 0433 05/02/21 0545 05/04/21 0552  WBC 9.9 10.0 10.5 11.2* 13.3*  HGB 10.4* 11.8* 11.5* 12.9 11.0*  HCT 31.6* 35.4* 35.3* 38.2 33.9*  MCV 94.9 93.9 94.6 92.7 95.5  PLT 249 321 324 346 346   Cardiac Enzymes: No results for input(s): CKTOTAL, CKMB, CKMBINDEX, TROPONINI in the last 168 hours. BNP (last 3 results) No results for input(s): BNP in the last 8760 hours.  ProBNP (last 3 results) No results for input(s): PROBNP in the last 8760 hours.  CBG: No results for input(s): GLUCAP in the last 168 hours.     Radiology Reports  No results found.     Antibiotics: Anti-infectives (From admission, onward)    Start     Dose/Rate Route Frequency Ordered Stop   04/29/21 1000  vancomycin (VANCOCIN) capsule 125 mg        125 mg Oral 4 times daily 04/29/21 0815 05/07/21 2359   04/28/21 1430  metroNIDAZOLE (FLAGYL) tablet 500 mg  Status:  Discontinued        500 mg Oral Every 8 hours 04/28/21 1358 04/29/21 0945   04/25/21 1245   vancomycin (VANCOCIN) 50 mg/mL oral solution SOLN 125 mg  Status:  Discontinued        125 mg Oral Every 6 hours 04/25/21 1145 04/29/21 0815   04/20/21 1500  vancomycin (VANCOCIN) capsule 125 mg  Status:  Discontinued        125 mg Oral 4 times daily 04/20/21 1405 04/25/21 1145         DVT prophylaxis: SCDs  Code Status: Full code  Family Communication: Discussed with patient's sister at bedside   Consultants: Gastroenterology Intervention radiology  Oncology  Procedures:     Objective    Physical Examination:   General-appears in no acute distress Heart-S1-S2, regular, no murmur auscultated Lungs-clear to auscultation bilaterally, no wheezing or crackles auscultated Abdomen-soft, nontender, no organomegaly Extremities-no edema in the  lower extremities Neuro-alert, oriented x3, no focal deficit noted  Status is: Inpatient  Dispo: The patient is from: Home              Anticipated d/c is to: Home              Anticipated d/c date is: 05/07/2021              Patient currently not stable for discharge  Barrier to discharge-ongoing diarrhea  COVID-19 Labs  No results for input(s): DDIMER, FERRITIN, LDH, CRP in the last 72 hours.  Lab Results  Component Value Date   [place] NEGATIVE 04/19/2021            No results found for this or any previous visit (from the past 240 hour(s)).  Oswald Hillock   [facility] If 7PM-7AM, please contact night-coverage at [email or web address], Office  [phone]   05/05/2021, 4:50 PM  LOS: 16 days

## 2021-05-05 NOTE — Progress Notes (Signed)
PT Cancellation Note  Patient Details Name: Sara Barnes MRN: [id number] DOB: 1940/07/06   Cancelled Treatment:    Reason Eval/Treat Not Completed: Other (comment). Pt about to eat breakfast and sister requesting hold off on PT as pt didn't eat yesterday.  Will check back as schedule permits.   Galen Manila 05/05/2021, 10:12 AM

## 2021-05-06 ENCOUNTER — Telehealth: Payer: Self-pay | Admitting: Internal Medicine

## 2021-05-06 DIAGNOSIS — R197 Diarrhea, unspecified: Secondary | ICD-10-CM | POA: Diagnosis not present

## 2021-05-06 DIAGNOSIS — R112 Nausea with vomiting, unspecified: Secondary | ICD-10-CM | POA: Diagnosis not present

## 2021-05-06 DIAGNOSIS — A0472 Enterocolitis due to Clostridium difficile, not specified as recurrent: Secondary | ICD-10-CM | POA: Diagnosis not present

## 2021-05-06 DIAGNOSIS — K5010 Crohn's disease of large intestine without complications: Secondary | ICD-10-CM | POA: Diagnosis not present

## 2021-05-06 MED ORDER — DIPHENOXYLATE-ATROPINE 2.5-0.025 MG PO TABS
2.0000 | ORAL_TABLET | Freq: Four times a day (QID) | ORAL | Status: DC
  Administered 2021-05-06 – 2021-05-14 (×29): 2 via ORAL
  Filled 2021-05-06 (×32): qty 2

## 2021-05-06 MED ORDER — SODIUM CHLORIDE 0.9 % IV SOLN: INTRAVENOUS | Status: DC

## 2021-05-06 NOTE — Telephone Encounter (Signed)
Returned call. Left voicemail for patient's sister to call back.

## 2021-05-06 NOTE — Progress Notes (Addendum)
[facility]  PROGRESS NOTE  Sara Barnes [id number]:[id number] DOB: 1941-01-09 DOA: 04/19/2021 PCP: Ann Held, DO   Brief HPI:   81 year old female with past medical history of Crohn's disease, depression, insomnia, metastatic breast cancer, with mets to chest wall that has been excised, dyspepsia admitted with C. difficile colitis with complaints of nausea, vomiting and diarrhea.  She s/p biopsy of liver.  Post biopsy patient developed liver hematoma.  She has ongoing diarrhea.    Subjective   Diarrhea improved after changing the dose of Lomotil to 2 tablets 4 times a day as needed.  IV Dilaudid has been changed to p.o. Dilaudid.  Pain is adequately  controlled   Assessment/Plan:    C. difficile colitis/diarrhea/Crohn's disease -Improved -Patient was diagnosed with C. difficile colitis -Started on p.o. vancomycin -Diarrhea initially improved now again worsened since last night -Called and discussed with ID, ID does not feel that addition of Flagyl or Dificid would help -She has a history of Crohn's disease and had bloody bowel movement this morning -Gastroenterology was consulted, recommended stopping Lomotil and trying cholestyramine -Lomotil was discontinued and patient is currently on cholestyramine  -Continue Lomotil 2 tablets every 6 hours as needed for liquid stools -Continue vancomycin, as it was stopped for 4 days.  New stop date is 05/07/2021.    S/p liver biopsy/hematoma -CT abdomen/pelvis shows hematoma s/p liver biopsy -General surgery and IR were consulted -General surgery has signed off -IR has signed off  Intractable nausea -Patient stated she was taking Zofran at home without any problem -We will discontinue Phenergan -Started on Zofran ODT 8 mg p.o. every 8 hours as needed  History of metastatic breast cancer -With mets to chest wall that has been excised -CT abdomen/pelvis showed new hepatic lesions suspicious for metastatic cancer -She s/p  liver biopsy on 04/24/2021 -Biopsy results confirmed metastatic breast cancer -Oncology, Dr. Annamaria Boots is aware and will see patient as outpatient  Hypokalemia - replete   Acute kidney injury -Resolved with IV hydration -Renal ultrasound shows no evidence of hydronephrosis  Anemia from acute blood loss -Secondary to liver laceration since liver biopsy -Patient received 1 unit PRBC -Hemoglobin is stable at 11.5 -Lovenox was stopped  Transient atrial fibrillation -Likely from ongoing diarrhea, electrolyte imbalance, anemia -Patient had 2 episodes of transient atrial fibrillation during hospital stay -Continue as needed metoprolol for rate control -Patient is not a candidate for anticoagulation due to intra-abdominal hematoma -Echocardiogram showed normal EF -TSH normal  Chest pain -Patient has been having intermittent chest pain, though she also complains of epigastric discomfort with cramping abdominal pain along with these episodes -EKG obtained this morning was unremarkable -Troponin 29, 21 -Doubt cardiac cause of chest pain, likely chest pain is due to the nausea, abdominal cramping  Medications     cholestyramine light  4 g Oral Q24H   diclofenac Sodium  2 g Topical QID   FLUoxetine  20 mg Oral Daily   metoprolol tartrate  12.5 mg Oral BID   multivitamin with minerals  1 tablet Oral Daily   predniSONE  5 mg Oral Q breakfast   saccharomyces boulardii  250 mg Oral BID   vancomycin  125 mg Oral QID     Data Reviewed:   CBG:  No results for input(s): GLUCAP in the last 168 hours.  SpO2: 96 % O2 Flow Rate (L/min): 2 L/min    Vitals:   05/04/21 1935 05/05/21 0306 05/05/21 2244 05/06/21 0647  BP: (!) 166/54 (!) 123/55 Marland Kitchen)  157/69 (!) 123/59  Pulse: 78 67 72 67  Resp: 16 18 18 18   Temp: 98.2 F (36.8 C) 97.7 F (36.5 C) (!) 97.3 F (36.3 C) 97.8 F (36.6 C)  TempSrc: Oral Oral Oral Oral  SpO2: 98% 96% 99% 96%  Weight:      Height:         Intake/Output  Summary (Last 24 hours) at 05/06/2021 0829 Last data filed at 05/05/2021 1700 Gross per 24 hour  Intake 241 ml  Output --  Net 241 ml    01/07 1901 - 01/09 0700 In: 241 [P.O.:241] Out: -   Filed Weights   04/19/21 2130  Weight: 72.1 kg    Data Reviewed: Basic Metabolic Panel: Recent Labs  Lab 04/30/21 0436 05/01/21 0433 05/02/21 0545 05/03/21 0534 05/04/21 0552  NA 132* 133* 132* 133* 134*  K 3.6 3.1* 3.9 3.6 3.8  CL 99 97* 99 105 106  CO2 25 24 23  19* 21*  GLUCOSE 98 88 88 100* 93  BUN 25* 31* 31* 22 14  CREATININE 1.24* 1.28* 1.14* 1.06* 0.90  CALCIUM 9.2 9.1 9.1 8.8* 8.8*   Liver Function Tests: Recent Labs  Lab 04/30/21 0436 05/01/21 0433 05/02/21 0545  AST 18 18 20   ALT 15 13 15   ALKPHOS 88 78 85  BILITOT 1.1 0.9 0.8  PROT 6.6 6.7 6.8  ALBUMIN 3.8 3.7 3.8   No results for input(s): LIPASE, AMYLASE in the last 168 hours. No results for input(s): AMMONIA in the last 168 hours. CBC: Recent Labs  Lab 04/30/21 0436 05/01/21 0433 05/02/21 0545 05/04/21 0552  WBC 10.0 10.5 11.2* 13.3*  HGB 11.8* 11.5* 12.9 11.0*  HCT 35.4* 35.3* 38.2 33.9*  MCV 93.9 94.6 92.7 95.5  PLT 321 324 346 346   Cardiac Enzymes: No results for input(s): CKTOTAL, CKMB, CKMBINDEX, TROPONINI in the last 168 hours. BNP (last 3 results) No results for input(s): BNP in the last 8760 hours.  ProBNP (last 3 results) No results for input(s): PROBNP in the last 8760 hours.  CBG: No results for input(s): GLUCAP in the last 168 hours.     Radiology Reports  No results found.     Antibiotics: Anti-infectives (From admission, onward)    Start     Dose/Rate Route Frequency Ordered Stop   04/29/21 1000  vancomycin (VANCOCIN) capsule 125 mg        125 mg Oral 4 times daily 04/29/21 0815 05/07/21 2359   04/28/21 1430  metroNIDAZOLE (FLAGYL) tablet 500 mg  Status:  Discontinued        500 mg Oral Every 8 hours 04/28/21 1358 04/29/21 0945   04/25/21 1245  vancomycin (VANCOCIN)  50 mg/mL oral solution SOLN 125 mg  Status:  Discontinued        125 mg Oral Every 6 hours 04/25/21 1145 04/29/21 0815   04/20/21 1500  vancomycin (VANCOCIN) capsule 125 mg  Status:  Discontinued        125 mg Oral 4 times daily 04/20/21 1405 04/25/21 1145         DVT prophylaxis: SCDs  Code Status: Full code  Family Communication: Discussed with patient's sister at bedside   Consultants: Gastroenterology Intervention radiology  Oncology  Procedures:     Objective    Physical Examination:   General-appears in no acute distress Heart-S1-S2, regular, no murmur auscultated Lungs-clear to auscultation bilaterally, no wheezing or crackles auscultated Abdomen-soft, nontender, no organomegaly Extremities-no edema in the lower extremities Neuro-alert, oriented x3,  no focal deficit noted  Status is: Inpatient  Dispo: The patient is from: Home              Anticipated d/c is to: Home              Anticipated d/c date is: 05/07/2021              Patient currently not stable for discharge  Barrier to discharge-ongoing diarrhea  COVID-19 Labs  No results for input(s): DDIMER, FERRITIN, LDH, CRP in the last 72 hours.  Lab Results  Component Value Date   [place] NEGATIVE 04/19/2021            No results found for this or any previous visit (from the past 240 hour(s)).  Oswald Hillock   [facility] If 7PM-7AM, please contact night-coverage at [email or web address], Office  [phone]   05/06/2021, 8:29 AM  LOS: 17 days

## 2021-05-06 NOTE — Telephone Encounter (Signed)
Spoke with patient's sister. Pt's sister wanted the GI doctors to see pt in hospital. Advised sister to discuss that with the hospital doctor taking care of patient. Told sister hospital doctor could consult GI. Patient's sister verbalized understanding.

## 2021-05-06 NOTE — Care Management Important Message (Signed)
Important Message  Patient Details IM Letter placed in Patients room. Name: Sara Barnes MRN: [id number] Date of Birth: 1940/08/29   Medicare Important Message Given:  Yes     Kerin Salen 05/06/2021, 1:30 PM

## 2021-05-06 NOTE — Progress Notes (Signed)
Nutrition Follow-up  INTERVENTION:   -Needs updated weight  -Encourage PO intakes  -Continue Multivitamin with minerals daily  NUTRITION DIAGNOSIS:   Inadequate oral intake related to acute illness as evidenced by per patient/family report.  Ongoing.  GOAL:   Patient will meet greater than or equal to 90% of their needs  Progressing.  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  Patient has been having ongoing diarrhea. Pt expected to stop antibiotics on 1/10.  Consumed 100% of breakfast yesterday.  Noted that Boost Breeze and Prosource Plus was discontinued on 1/7. Pt was not accepting these supplements.   Admission weight: 158 lbs. Needs updated weight.  Medications: Lomotil, Multivitamin with minerals daily, Florastor, Zofran   Labs reviewed: Low Na  Diet Order:   Diet Order             Diet regular Room service appropriate? Yes; Fluid consistency: Thin  Diet effective now                   EDUCATION NEEDS:   Education needs have been addressed  Skin:  Skin Assessment: Reviewed RN Assessment  Last BM:  1/8 -type 7  Height:   Ht Readings from Last 1 Encounters:  04/19/21 5' 6"  (1.676 m)    Weight:   Wt Readings from Last 1 Encounters:  04/19/21 72.1 kg    BMI:  Body mass index is 25.66 kg/m.  Estimated Nutritional Needs:   Kcal:   1700-1900kcal/day  Protein:  85-95g/day  Fluid:  1.5-1.8L/day  Clayton Bibles, MS, RD, LDN Inpatient Clinical Dietitian Contact information available via Amion

## 2021-05-06 NOTE — Telephone Encounter (Signed)
Patients sister called stating that she was in the hospital and wanted a call back from nurse. Would not disclose any information. Please advise.  [phone]

## 2021-05-07 DIAGNOSIS — R112 Nausea with vomiting, unspecified: Secondary | ICD-10-CM | POA: Diagnosis not present

## 2021-05-07 LAB — BASIC METABOLIC PANEL
Anion gap: 9 (ref 5–15)
BUN: 9 mg/dL (ref 8–23)
CO2: 19 mmol/L — ABNORMAL LOW (ref 22–32)
Calcium: 8.9 mg/dL (ref 8.9–10.3)
Chloride: 105 mmol/L (ref 98–111)
Creatinine, Ser: 0.96 mg/dL (ref 0.44–1.00)
GFR, Estimated: >60 mL/min (ref >60)
Glucose, Bld: 96 mg/dL (ref 70–99)
Potassium: 3.5 mmol/L (ref 3.5–5.1)
Sodium: 133 mmol/L — ABNORMAL LOW (ref 135–145)

## 2021-05-07 NOTE — Progress Notes (Addendum)
PROGRESS NOTE    Sara Barnes  XKP:[id number] DOB: 1941/04/04 DOA: 04/19/2021  PCP: Ann Held, DO   Brief Narrative:  This 81 year old female with PMH significant for Crohn's disease, depression, insomnia, metastatic breast cancer, with mets to chest wall that has been excised, dyspepsia admitted with C. difficile colitis with complaints of nausea, vomiting and diarrhea.  She s/p biopsy of liver.  Post biopsy patient developed liver hematoma being followed by IR. Now H/H stable.She has ongoing diarrhea.  Assessment & Plan:   Principal Problem:   Intractable nausea and vomiting Active Problems:   Depression   Diarrhea   Crohn disease (HCC)   Hyponatremia   Clostridium difficile colitis  C. difficile colitis /diarrhea/Crohn's disease: > Improving Patient was diagnosed with C. difficile colitis. Continue p.o. vancomycin for 14 days Diarrhea initially improved, now again worsened since yesterday. ID does not feel that addition of Flagyl or Dificid would help. She has history of Crohn's disease and had a bloody bowel movement yesterday. Gastroenterology was consulted, recommended stopping Lomotil and cholestyramine. Lomotil is discontinued, patient is currently on cholestyramine. Consider Lomotil 2 tablets every 6 hours as needed for liquid stools Continue vancomycin, as it was stopped for 4 days.  New stop date is 05/08/2021.     S/p liver biopsy > hematoma : CT abdomen pelvis showed hematoma status post liver biopsy. General surgery and IR were consulted H&H is stable.  GI and surgery signed off.  Intractable nausea: Continue Zofran ODT every 8 hours as needed   History of metastatic breast cancer: Patient has mets to the chest wall that has been excised. CT abdomen pelvis showed new hepatic lesions suspicious for metastatic cancer She is s/p liver biopsy on 04/24/2021 Biopsy confirmed metastatic breast cancer Oncology Dr. Annamaria Boots is aware and follow-up  outpatient.   Hypokalemia: Replaced. Continue to monitor.   Acute kidney injury: > Resolved. Resolved with IV hydration.   Renal ultrasound no hydronephrosis.  Acute blood loss anemia Liver laceration since liver biopsy. S/p 1 unit PRBC.  H&H is stable Lovenox was discontinued  Paroxysmal atrial fibrillation: Likely from ongoing diarrhea, electrolyte imbalance and anemia. Patient had 2 episodes of transient A. fib during hospital stay. Continue as needed metoprolol for rate control. She is not a candidate for anticoagulation due to intra-abdominal hematoma Echocardiogram shows normal LVEF. TSH normal.  Intermittent chest pain: Patient reports intermittent chest pain, abdominal discomfort cramping with these episodes EKG unremarkable, troponin 29> 21 Doubt cardiac cause of chest pain likely atypical  DVT prophylaxis: SCDs Code Status: (Full code) Family Communication: No family at bed side. Disposition Plan:   Status is: Inpatient  Remains inpatient appropriate because: Admitted for C. difficile colitis having ongoing diarrhea.  Remains on p.o. vancomycin.  Consultants:  Gastroenterology Interventional radiology General surgery Infectious diseases  Procedures: Antimicrobials:  Anti-infectives (From admission, onward)    Start     Dose/Rate Route Frequency Ordered Stop   04/29/21 1000  vancomycin (VANCOCIN) capsule 125 mg        125 mg Oral 4 times daily 04/29/21 0815 05/07/21 2359   04/28/21 1430  metroNIDAZOLE (FLAGYL) tablet 500 mg  Status:  Discontinued        500 mg Oral Every 8 hours 04/28/21 1358 04/29/21 0945   04/25/21 1245  vancomycin (VANCOCIN) 50 mg/mL oral solution SOLN 125 mg  Status:  Discontinued        125 mg Oral Every 6 hours 04/25/21 1145 04/29/21 0815   04/20/21 1500  vancomycin (VANCOCIN) capsule 125 mg  Status:  Discontinued        125 mg Oral 4 times daily 04/20/21 1405 04/25/21 1145        Subjective: Patient was seen and examined at  bedside.  Overnight events noted.   Patient reports feeling better, She still has ongoing nausea and diarrhea.   She reports feeling very weak,  states not able to get out of bed today.  Objective: Vitals:   05/06/21 0647 05/06/21 1330 05/06/21 1926 05/07/21 0409  BP: (!) 123/59 (!) 116/53 135/67 (!) 157/63  Pulse: 67 68 72 71  Resp: 18 16 18 18   Temp: 97.8 F (36.6 C) 98.6 F (37 C) 97.7 F (36.5 C) 98 F (36.7 C)  TempSrc: Oral Oral Oral Oral  SpO2: 96% 96% 96% 96%  Weight:      Height:        Intake/Output Summary (Last 24 hours) at 05/07/2021 1351 Last data filed at 05/06/2021 1500 Gross per 24 hour  Intake 73.9 ml  Output --  Net 73.9 ml   Filed Weights   04/19/21 2130  Weight: 72.1 kg    Examination:  General exam: Appears comfortable, not in any acute distress. Respiratory system: Clear to auscultation bilaterally, respiratory effort normal, RR 15 Cardiovascular system: S1-S2 heard, regular rate and rhythm, no murmur. Gastrointestinal system: Abdomen is soft, nontender, nondistended, BS+ Central nervous system: Alert and oriented X 3. No focal neurological deficits. Extremities: No edema, no cyanosis, no clubbing. Skin: No rashes, lesions or ulcers Psychiatry: Judgement and insight appear normal. Mood & affect appropriate.     Data Reviewed: I have personally reviewed following labs and imaging studies  CBC: Recent Labs  Lab 05/01/21 0433 05/02/21 0545 05/04/21 0552  WBC 10.5 11.2* 13.3*  HGB 11.5* 12.9 11.0*  HCT 35.3* 38.2 33.9*  MCV 94.6 92.7 95.5  PLT 324 346 947   Basic Metabolic Panel: Recent Labs  Lab 05/01/21 0433 05/02/21 0545 05/03/21 0534 05/04/21 0552 05/07/21 0449  NA 133* 132* 133* 134* 133*  K 3.1* 3.9 3.6 3.8 3.5  CL 97* 99 105 106 105  CO2 24 23 19* 21* 19*  GLUCOSE 88 88 100* 93 96  BUN 31* 31* 22 14 9   CREATININE 1.28* 1.14* 1.06* 0.90 0.96  CALCIUM 9.1 9.1 8.8* 8.8* 8.9   GFR: Estimated Creatinine Clearance: 48.3  mL/min (by C-G formula based on SCr of 0.96 mg/dL). Liver Function Tests: Recent Labs  Lab 05/01/21 0433 05/02/21 0545  AST 18 20  ALT 13 15  ALKPHOS 78 85  BILITOT 0.9 0.8  PROT 6.7 6.8  ALBUMIN 3.7 3.8   No results for input(s): LIPASE, AMYLASE in the last 168 hours. No results for input(s): AMMONIA in the last 168 hours. Coagulation Profile: No results for input(s): INR, PROTIME in the last 168 hours. Cardiac Enzymes: No results for input(s): CKTOTAL, CKMB, CKMBINDEX, TROPONINI in the last 168 hours. BNP (last 3 results) No results for input(s): PROBNP in the last 8760 hours. HbA1C: No results for input(s): HGBA1C in the last 72 hours. CBG: No results for input(s): GLUCAP in the last 168 hours. Lipid Profile: No results for input(s): CHOL, HDL, LDLCALC, TRIG, CHOLHDL, LDLDIRECT in the last 72 hours. Thyroid Function Tests: No results for input(s): TSH, T4TOTAL, FREET4, T3FREE, THYROIDAB in the last 72 hours. Anemia Panel: No results for input(s): VITAMINB12, FOLATE, FERRITIN, TIBC, IRON, RETICCTPCT in the last 72 hours. Sepsis Labs: No results for input(s): PROCALCITON, LATICACIDVEN  in the last 168 hours.  No results found for this or any previous visit (from the past 240 hour(s)).   Radiology Studies: No results found.  Scheduled Meds:  cholestyramine light  4 g Oral Q24H   diclofenac Sodium  2 g Topical QID   diphenoxylate-atropine  2 tablet Oral QID   FLUoxetine  20 mg Oral Daily   metoprolol tartrate  12.5 mg Oral BID   multivitamin with minerals  1 tablet Oral Daily   predniSONE  5 mg Oral Q breakfast   saccharomyces boulardii  250 mg Oral BID   vancomycin  125 mg Oral QID   Continuous Infusions:  sodium chloride 75 mL/hr at 05/06/21 1353     LOS: 18 days    Time spent: 45 mins    Sara Votaw, MD [facility]   If 7PM-7AM, please contact night-coverage

## 2021-05-07 NOTE — Progress Notes (Signed)
Pt has concerns with taking 2 Lamotil 4x/day. She says she normally takes 2 pills twice/day before meals. She wants the doctor to address this and advise if she can go back to this regimen.

## 2021-05-07 NOTE — Plan of Care (Signed)
Pt Sara Barnes, pleasant and cooperative with staff.  Up in room with supervision, still stating she is weak. Refusing IV access, RN encouraging oral intake.  PO Vanco, antidiarrheals per orders.  Problem: Education: Goal: Knowledge of General Education information will improve Description: Including pain rating scale, medication(s)/side effects and non-pharmacologic comfort measures Outcome: Progressing   Problem: Health Behavior/Discharge Planning: Goal: Ability to manage health-related needs will improve Outcome: Progressing   Problem: Clinical Measurements: Goal: Ability to maintain clinical measurements within normal limits will improve Outcome: Progressing Goal: Will remain free from infection Outcome: Progressing Goal: Diagnostic test results will improve Outcome: Progressing Goal: Respiratory complications will improve Outcome: Progressing Goal: Cardiovascular complication will be avoided Outcome: Progressing   Problem: Activity: Goal: Risk for activity intolerance will decrease Outcome: Progressing   Problem: Nutrition: Goal: Adequate nutrition will be maintained Outcome: Progressing   Problem: Coping: Goal: Level of anxiety will decrease Outcome: Progressing   Problem: Elimination: Goal: Will not experience complications related to bowel motility Outcome: Progressing Goal: Will not experience complications related to urinary retention Outcome: Progressing   Problem: Pain Managment: Goal: General experience of comfort will improve Outcome: Progressing   Problem: Safety: Goal: Ability to remain free from injury will improve Outcome: Progressing   Problem: Skin Integrity: Goal: Risk for impaired skin integrity will decrease Outcome: Progressing

## 2021-05-07 NOTE — Progress Notes (Signed)
Physical Therapy Treatment Patient Details Name: Sara Barnes MRN: [id number] DOB: 06/30/1940 Today's Date: 05/07/2021   History of Present Illness Patient is a 81 year old female who presented with nausea, vomitting and diarhea. Patient was admitted with intractable nausea and vomitting, C diff and dyspnea. Pt is s/p liver biopsy on 12/28 and CT abdomen shows post liver biopsy bleed which is causing her chest discomfort.  PMH: crohn's disease, R hemicolectomy, SBO, ileostomy revision.    PT Comments    Progressing with mobility. Some encouragement for participation. Pt walked in room without the walker then in hallway with the walker.    Recommendations for follow up therapy are one component of a multi-disciplinary discharge planning process, led by the attending physician.  Recommendations may be updated based on patient status, additional functional criteria and insurance authorization.  Follow Up Recommendations  Home health PT     Assistance Recommended at Discharge Intermittent Supervision/Assistance  Patient can return home with the following Assistance with cooking/housework   Equipment Recommendations  None recommended by PT    Recommendations for Other Services       Precautions / Restrictions Precautions Precautions: Fall Precaution Comments: C - Diff Restrictions Weight Bearing Restrictions: No     Mobility  Bed Mobility Overal bed mobility: Modified Independent                  Transfers Overall transfer level: Modified independent     Sit to Stand: Supervision                Ambulation/Gait Ambulation/Gait assistance: Supervision Gait Distance (Feet): 125 Feet Assistive device: None;Rolling walker (2 wheels)         General Gait Details: walked ~15 feet x 2 without a device in room. she then walked ~125 feet with a RW in the hallway. Dyspnea 2/4.   Stairs             Wheelchair Mobility    Modified Rankin (Stroke  Patients Only)       Balance Overall balance assessment: Needs assistance         Standing balance support: No upper extremity supported;During functional activity;Bilateral upper extremity supported Standing balance-Leahy Scale: Fair                              Cognition Arousal/Alertness: Awake/alert Behavior During Therapy: WFL for tasks assessed/performed Overall Cognitive Status: Within Functional Limits for tasks assessed                                 General Comments: AxO x 3 pleasant.  Has a very supportive younger sister who helps.        Exercises      General Comments        Pertinent Vitals/Pain Pain Assessment: No/denies pain    Home Living                          Prior Function            PT Goals (current goals can now be found in the care plan section) Progress towards PT goals: Progressing toward goals    Frequency    Min 3X/week      PT Plan Current plan remains appropriate    Co-evaluation  AM-PAC PT "6 Clicks" Mobility   Outcome Measure  Help needed turning from your back to your side while in a flat bed without using bedrails?: None Help needed moving from lying on your back to sitting on the side of a flat bed without using bedrails?: None Help needed moving to and from a bed to a chair (including a wheelchair)?: None Help needed standing up from a chair using your arms (e.g., wheelchair or bedside chair)?: A Little Help needed to walk in hospital room?: A Little Help needed climbing 3-5 steps with a railing? : A Little 6 Click Score: 21    End of Session   Activity Tolerance: Patient tolerated treatment well Patient left: in bed;with call bell/phone within reach   PT Visit Diagnosis: Difficulty in walking, not elsewhere classified (R26.2);Muscle weakness (generalized) (M62.81)     Time: 8616-8372 PT Time Calculation (min) (ACUTE ONLY): 12 min  Charges:  $Gait  Training: 8-22 mins                         Doreatha Massed, PT Acute Rehabilitation  Office: [phone] Pager: [phone]

## 2021-05-07 NOTE — Progress Notes (Signed)
Pt refusing IV access restart at this time. Iv fluids ordered.  Educated on importance of fluids and this RN encouraged oral intake as much as she can tolerate.

## 2021-05-08 LAB — CBC
HCT: 34.1 % — ABNORMAL LOW (ref 36.0–46.0)
Hemoglobin: 11 g/dL — ABNORMAL LOW (ref 12.0–15.0)
MCH: 30.7 pg (ref 26.0–34.0)
MCHC: 32.3 g/dL (ref 30.0–36.0)
MCV: 95.3 fL (ref 80.0–100.0)
Platelets: 351 10*3/uL (ref 150–400)
RBC: 3.58 MIL/uL — ABNORMAL LOW (ref 3.87–5.11)
RDW: 11.9 % (ref 11.5–15.5)
WBC: 10.6 10*3/uL — ABNORMAL HIGH (ref 4.0–10.5)
nRBC: 0 % (ref 0.0–0.2)

## 2021-05-08 LAB — BASIC METABOLIC PANEL
Anion gap: 7 (ref 5–15)
BUN: 10 mg/dL (ref 8–23)
CO2: 22 mmol/L (ref 22–32)
Calcium: 8.8 mg/dL — ABNORMAL LOW (ref 8.9–10.3)
Chloride: 106 mmol/L (ref 98–111)
Creatinine, Ser: 1.03 mg/dL — ABNORMAL HIGH (ref 0.44–1.00)
GFR, Estimated: 55 mL/min — ABNORMAL LOW (ref >60)
Glucose, Bld: 93 mg/dL (ref 70–99)
Potassium: 3.1 mmol/L — ABNORMAL LOW (ref 3.5–5.1)
Sodium: 135 mmol/L (ref 135–145)

## 2021-05-08 LAB — PHOSPHORUS: Phosphorus: 4 mg/dL (ref 2.5–4.6)

## 2021-05-08 LAB — MAGNESIUM: Magnesium: 1.8 mg/dL (ref 1.7–2.4)

## 2021-05-08 LAB — SURGICAL PATHOLOGY

## 2021-05-08 MED ORDER — POTASSIUM CHLORIDE CRYS ER 20 MEQ PO TBCR
40.0000 meq | EXTENDED_RELEASE_TABLET | ORAL | Status: AC
  Administered 2021-05-08 (×2): 40 meq via ORAL
  Filled 2021-05-08 (×2): qty 2

## 2021-05-08 NOTE — Progress Notes (Signed)
PROGRESS NOTE    Sara MICHAUX  JSE:[id number] DOB: [birth year] DOA: 04/19/2021 PCP: Ann Held, DO    Brief Narrative:  81 year old female with PMH significant for Crohn's disease, depression, insomnia, metastatic breast cancer, with mets to chest wall that has been excised, dyspepsia admitted with C. difficile colitis with complaints of nausea, vomiting and diarrhea.  She s/p biopsy of liver.  Post biopsy patient developed liver hematoma being followed by IR. Now H/H stable  Assessment & Plan:   Principal Problem:   Intractable nausea and vomiting Active Problems:   Depression   Diarrhea   Crohn disease (HCC)   Hyponatremia   Clostridium difficile colitis  C. difficile colitis /diarrhea/Crohn's disease: > Improving Patient was diagnosed with C. difficile colitis. Continue p.o. vancomycin for 14 days Diarrhea initially improved, now again worsened since yesterday. ID does not feel that addition of Flagyl or Dificid would help. She has history of Crohn's disease and had a bloody bowel movement yesterday. Gastroenterology was consulted, recommended stopping Lomotil and cholestyramine. Lomotil is discontinued, patient is currently on cholestyramine. Consider Lomotil 2 tablets every 6 hours as needed for liquid stools Continue vancomycin, as it was stopped for 4 days.  New stop date is 05/08/2021.   This AM pt reported intolerance to PO intake, attributing to her abx   S/p liver biopsy > hematoma : CT abdomen pelvis showed hematoma status post liver biopsy. General surgery and IR were consulted H&H is stable.  GI and surgery signed off.   Intractable nausea: Continue Zofran ODT every 8 hours as needed Reports antiemetic is effective   History of metastatic breast cancer: Patient has mets to the chest wall that has been excised. CT abdomen pelvis showed new hepatic lesions suspicious for metastatic cancer She is s/p liver biopsy on 04/24/2021 Biopsy confirmed  metastatic breast cancer Oncology Dr. Annamaria Boots is aware and will follow-up outpatient.   Hypokalemia: Replaced. Continue to monitor.   Acute kidney injury: > Resolved. Resolved with IV hydration.   Renal ultrasound no hydronephrosis. Renal function stable   Acute blood loss anemia Liver laceration since liver biopsy. S/p 1 unit PRBC.  H&H is stable Lovenox was discontinued   Paroxysmal atrial fibrillation: Likely from ongoing diarrhea, electrolyte imbalance and anemia. Patient had 2 episodes of transient A. fib during hospital stay. Continue as needed metoprolol for rate control. She is not a candidate for anticoagulation due to intra-abdominal hematoma Echocardiogram shows normal LVEF. TSH normal.   Intermittent chest pain: Patient reports intermittent chest pain, abdominal discomfort cramping with these episodes EKG unremarkable, troponin 29> 21 Doubt cardiac cause of chest pain likely atypical    DVT prophylaxis: SCD's Code Status: Full Family Communication: Pt in room, family at bedside  Status is: Inpatient  Remains inpatient appropriate because: Severity of illness    Consultants:  Gastroenterology Interventional radiology General surgery Infectious diseases  Procedures:    Antimicrobials: Anti-infectives (From admission, onward)    Start     Dose/Rate Route Frequency Ordered Stop   04/29/21 1000  vancomycin (VANCOCIN) capsule 125 mg        125 mg Oral 4 times daily 04/29/21 0815 05/07/21 2359   04/28/21 1430  metroNIDAZOLE (FLAGYL) tablet 500 mg  Status:  Discontinued        500 mg Oral Every 8 hours 04/28/21 1358 04/29/21 0945   04/25/21 1245  vancomycin (VANCOCIN) 50 mg/mL oral solution SOLN 125 mg  Status:  Discontinued        125  mg Oral Every 6 hours 04/25/21 1145 04/29/21 0815   04/20/21 1500  vancomycin (VANCOCIN) capsule 125 mg  Status:  Discontinued        125 mg Oral 4 times daily 04/20/21 1405 04/25/21 1145       Subjective: Complaining of  decreased tolerance to PO intake  Objective: Vitals:   05/07/21 1945 05/07/21 2016 05/08/21 0409 05/08/21 1233  BP: (!) 142/66 110/77 133/65 128/74  Pulse: 71 87 66 74  Resp: 18 18 18 20   Temp: 97.9 F (36.6 C) 98.6 F (37 C) 97.8 F (36.6 C) 98.3 F (36.8 C)  TempSrc: Oral Oral Oral Oral  SpO2: 98% 97% 97% 95%  Weight:      Height:        Intake/Output Summary (Last 24 hours) at 05/08/2021 1746 Last data filed at 05/08/2021 1400 Gross per 24 hour  Intake 480 ml  Output --  Net 480 ml   Filed Weights   04/19/21 2130  Weight: 72.1 kg    Examination: General exam: Awake, laying in bed, in nad Respiratory system: Normal respiratory effort, no wheezing Cardiovascular system: regular rate, s1, s2 Gastrointestinal system: Soft, nondistended, positive BS Central nervous system: CN2-12 grossly intact, strength intact Extremities: Perfused, no clubbing Skin: Normal skin turgor, no notable skin lesions seen Psychiatry: Mood normal // no visual hallucinations   Data Reviewed: I have personally reviewed following labs and imaging studies  CBC: Recent Labs  Lab 05/02/21 0545 05/04/21 0552 05/08/21 0437  WBC 11.2* 13.3* 10.6*  HGB 12.9 11.0* 11.0*  HCT 38.2 33.9* 34.1*  MCV 92.7 95.5 95.3  PLT 346 346 333   Basic Metabolic Panel: Recent Labs  Lab 05/02/21 0545 05/03/21 0534 05/04/21 0552 05/07/21 0449 05/08/21 0437  NA 132* 133* 134* 133* 135  K 3.9 3.6 3.8 3.5 3.1*  CL 99 105 106 105 106  CO2 23 19* 21* 19* 22  GLUCOSE 88 100* 93 96 93  BUN 31* 22 14 9 10   CREATININE 1.14* 1.06* 0.90 0.96 1.03*  CALCIUM 9.1 8.8* 8.8* 8.9 8.8*  MG  --   --   --   --  1.8  PHOS  --   --   --   --  4.0   GFR: Estimated Creatinine Clearance: 45 mL/min (A) (by C-G formula based on SCr of 1.03 mg/dL (H)). Liver Function Tests: Recent Labs  Lab 05/02/21 0545  AST 20  ALT 15  ALKPHOS 85  BILITOT 0.8  PROT 6.8  ALBUMIN 3.8   No results for input(s): LIPASE, AMYLASE in  the last 168 hours. No results for input(s): AMMONIA in the last 168 hours. Coagulation Profile: No results for input(s): INR, PROTIME in the last 168 hours. Cardiac Enzymes: No results for input(s): CKTOTAL, CKMB, CKMBINDEX, TROPONINI in the last 168 hours. BNP (last 3 results) No results for input(s): PROBNP in the last 8760 hours. HbA1C: No results for input(s): HGBA1C in the last 72 hours. CBG: No results for input(s): GLUCAP in the last 168 hours. Lipid Profile: No results for input(s): CHOL, HDL, LDLCALC, TRIG, CHOLHDL, LDLDIRECT in the last 72 hours. Thyroid Function Tests: No results for input(s): TSH, T4TOTAL, FREET4, T3FREE, THYROIDAB in the last 72 hours. Anemia Panel: No results for input(s): VITAMINB12, FOLATE, FERRITIN, TIBC, IRON, RETICCTPCT in the last 72 hours. Sepsis Labs: No results for input(s): PROCALCITON, LATICACIDVEN in the last 168 hours.  No results found for this or any previous visit (from the past 240 hour(s)).  Radiology Studies: No results found.  Scheduled Meds:  cholestyramine light  4 g Oral Q24H   diclofenac Sodium  2 g Topical QID   diphenoxylate-atropine  2 tablet Oral QID   FLUoxetine  20 mg Oral Daily   metoprolol tartrate  12.5 mg Oral BID   multivitamin with minerals  1 tablet Oral Daily   predniSONE  5 mg Oral Q breakfast   saccharomyces boulardii  250 mg Oral BID   Continuous Infusions:   LOS: 19 days   Marylu Lund, MD [facility] Pager On Amion  If 7PM-7AM, please contact night-coverage 05/08/2021, 5:46 PM

## 2021-05-08 NOTE — Plan of Care (Signed)
No acute events this shift. Problem: Education: Goal: Knowledge of General Education information will improve Description: Including pain rating scale, medication(s)/side effects and non-pharmacologic comfort measures Outcome: Progressing   Problem: Health Behavior/Discharge Planning: Goal: Ability to manage health-related needs will improve Outcome: Progressing   Problem: Clinical Measurements: Goal: Ability to maintain clinical measurements within normal limits will improve Outcome: Progressing Goal: Will remain free from infection Outcome: Progressing Goal: Diagnostic test results will improve Outcome: Progressing Goal: Respiratory complications will improve Outcome: Progressing Goal: Cardiovascular complication will be avoided Outcome: Progressing   Problem: Activity: Goal: Risk for activity intolerance will decrease Outcome: Progressing   Problem: Nutrition: Goal: Adequate nutrition will be maintained Outcome: Progressing   Problem: Coping: Goal: Level of anxiety will decrease Outcome: Progressing   Problem: Elimination: Goal: Will not experience complications related to bowel motility Outcome: Progressing Goal: Will not experience complications related to urinary retention Outcome: Progressing   Problem: Pain Managment: Goal: General experience of comfort will improve Outcome: Progressing   Problem: Safety: Goal: Ability to remain free from injury will improve Outcome: Progressing   Problem: Skin Integrity: Goal: Risk for impaired skin integrity will decrease Outcome: Progressing

## 2021-05-08 NOTE — Progress Notes (Signed)
°   05/08/21 1400  Mobility  Activity Ambulated in hall  Level of Assistance Contact guard assist, steadying assist  Assistive Device Front wheel walker  Distance Ambulated (ft) 200 ft  Mobility Ambulated with assistance in hallway  Mobility Response Tolerated well  Mobility performed by Mobility specialist  $Mobility charge 1 Mobility   Pt agreeable to mobilize this afternoon. Ambulated about 245f in hall with RW, tolerated well. She did note some belly pain prior to session, but states this has been ongoing. Otherwise no complaints. Assisted pt to bathroom and back to bed. Left pt in bed with call bell at side and family present.  [facility]ist [facility] Rehab Services Office: [phone]

## 2021-05-09 LAB — CBC
HCT: 36.7 % (ref 36.0–46.0)
Hemoglobin: 11.9 g/dL — ABNORMAL LOW (ref 12.0–15.0)
MCH: 30.3 pg (ref 26.0–34.0)
MCHC: 32.4 g/dL (ref 30.0–36.0)
MCV: 93.4 fL (ref 80.0–100.0)
Platelets: 399 10*3/uL (ref 150–400)
RBC: 3.93 MIL/uL (ref 3.87–5.11)
RDW: 12.1 % (ref 11.5–15.5)
WBC: 10.8 10*3/uL — ABNORMAL HIGH (ref 4.0–10.5)
nRBC: 0 % (ref 0.0–0.2)

## 2021-05-09 LAB — COMPREHENSIVE METABOLIC PANEL
ALT: 15 U/L (ref 0–44)
AST: 21 U/L (ref 15–41)
Albumin: 3.6 g/dL (ref 3.5–5.0)
Alkaline Phosphatase: 110 U/L (ref 38–126)
Anion gap: 8 (ref 5–15)
BUN: 11 mg/dL (ref 8–23)
CO2: 22 mmol/L (ref 22–32)
Calcium: 9.1 mg/dL (ref 8.9–10.3)
Chloride: 104 mmol/L (ref 98–111)
Creatinine, Ser: 0.9 mg/dL (ref 0.44–1.00)
GFR, Estimated: >60 mL/min (ref >60)
Glucose, Bld: 96 mg/dL (ref 70–99)
Potassium: 3.9 mmol/L (ref 3.5–5.1)
Sodium: 134 mmol/L — ABNORMAL LOW (ref 135–145)
Total Bilirubin: 0.6 mg/dL (ref 0.3–1.2)
Total Protein: 6.7 g/dL (ref 6.5–8.1)

## 2021-05-09 MED ORDER — MEGESTROL ACETATE 400 MG/10ML PO SUSP
400.0000 mg | Freq: Every day | ORAL | Status: DC
  Administered 2021-05-09 – 2021-05-10 (×2): 400 mg via ORAL
  Filled 2021-05-09 (×2): qty 10

## 2021-05-09 MED ORDER — KATE FARMS STANDARD 1.4 PO LIQD
325.0000 mL | Freq: Two times a day (BID) | ORAL | Status: DC
  Administered 2021-05-09 – 2021-05-16 (×13): 325 mL via ORAL
  Filled 2021-05-09 (×16): qty 325

## 2021-05-09 NOTE — Progress Notes (Signed)
°   05/09/21 1300  Mobility  Activity Ambulated in hall;Ambulated to bathroom  Level of Assistance Contact guard assist, steadying assist  [place] wheel walker  Distance Ambulated (ft) 200 ft  Mobility Ambulated with assistance in hallway  Mobility Response Tolerated well  Mobility performed by Mobility specialist  $Mobility charge 1 Mobility   Pt agreeable to mobilize this afternoon. Prior to session, RN administered pain meds and nutrition supplements. Pt stated she was experiencing 8/10 abdominal pain as well, however still wanted to proceed with the session. Pt ambulated about 256f in hall with RW, tolerated well. Took 1 standing rest break about halfway. On return trip, pt sounded like she was wheezing. Asked pt if this was normal, she stated it is but it is worse today. Left pt in bathroom with sister present. Notified RN of session.   [facility] Valley[facility]ist [facility] Office: [phone]

## 2021-05-09 NOTE — Progress Notes (Signed)
PROGRESS NOTE    Sara Barnes  AST:[id number] DOB: [birth year] DOA: 04/19/2021 PCP: Ann Held, DO    Brief Narrative:  81 year old female with PMH significant for Crohn's disease, depression, insomnia, metastatic breast cancer, with mets to chest wall that has been excised, dyspepsia admitted with C. difficile colitis with complaints of nausea, vomiting and diarrhea.  She s/p biopsy of liver.  Post biopsy patient developed liver hematoma being followed by IR. Now H/H stable  Assessment & Plan:   Principal Problem:   Intractable nausea and vomiting Active Problems:   Depression   Diarrhea   Crohn disease (HCC)   Hyponatremia   Clostridium difficile colitis  C. difficile colitis /diarrhea/Crohn's disease: > Improving Patient was diagnosed with C. difficile colitis. Continue p.o. vancomycin for 14 days Diarrhea initially improved, now again worsened since yesterday. ID does not feel that addition of Flagyl or Dificid would help. She has history of Crohn's disease and had a bloody bowel movement yesterday. Gastroenterology was earlier consulted, recommended stopping Lomotil and cholestyramine. Lomotil is discontinued, patient is currently on cholestyramine. Consider Lomotil 2 tablets every 6 hours as needed for liquid stools Continue vancomycin, as it was stopped for 4 days.  New stop date is 05/08/2021.   Still with limited PO intake   S/p liver biopsy > hematoma : CT abdomen pelvis showed hematoma status post liver biopsy. General surgery and IR were consulted H&H is stable.  GI and surgery has since signed off.   Intractable nausea: Continue Zofran ODT every 8 hours as needed Reports antiemetic is effective   History of metastatic breast cancer: Patient has mets to the chest wall that has been excised. CT abdomen pelvis showed new hepatic lesions suspicious for metastatic cancer She is s/p liver biopsy on 04/24/2021 Biopsy confirmed metastatic breast  cancer Oncology was made aware and will follow-up outpatient.   Hypokalemia: Replaced. Continue to monitor.   Acute kidney injury: > Resolved. Resolved with IV hydration.   Renal ultrasound no hydronephrosis. Renal function had remained stable   Acute blood loss anemia Liver laceration since liver biopsy. S/p 1 unit PRBC.  H&H remains stable Lovenox was discontinued   Paroxysmal atrial fibrillation: Likely from ongoing diarrhea, electrolyte imbalance and anemia. Patient had 2 episodes of transient A. fib during hospital stay. Continue as needed metoprolol for rate control. She is not a candidate for anticoagulation due to intra-abdominal hematoma Echocardiogram shows normal LVEF. TSH normal.   Intermittent chest pain: Patient reports intermittent chest pain, abdominal discomfort cramping with these episodes EKG unremarkable, troponin 29> 21 Doubt cardiac cause of chest pain likely atypical  Moderate protein calorie malnutrition Dietitian following Recommendation for 48hr calorie count Will give trial of appetite stimulant per recs Historically had needed TPN in past. Pt declines PEG   DVT prophylaxis: SCD's Code Status: Full Family Communication: Pt in room, family at bedside  Status is: Inpatient  Remains inpatient appropriate because: Severity of illness    Consultants:  Gastroenterology Interventional radiology General surgery Infectious diseases  Procedures:    Antimicrobials: Anti-infectives (From admission, onward)    Start     Dose/Rate Route Frequency Ordered Stop   04/29/21 1000  vancomycin (VANCOCIN) capsule 125 mg        125 mg Oral 4 times daily 04/29/21 0815 05/07/21 2359   04/28/21 1430  metroNIDAZOLE (FLAGYL) tablet 500 mg  Status:  Discontinued        500 mg Oral Every 8 hours 04/28/21 1358 04/29/21 0945  04/25/21 1245  vancomycin (VANCOCIN) 50 mg/mL oral solution SOLN 125 mg  Status:  Discontinued        125 mg Oral Every 6 hours 04/25/21  1145 04/29/21 0815   04/20/21 1500  vancomycin (VANCOCIN) capsule 125 mg  Status:  Discontinued        125 mg Oral 4 times daily 04/20/21 1405 04/25/21 1145       Subjective: Reports continued decreased appetite  Objective: Vitals:   05/08/21 0409 05/08/21 1233 05/08/21 2022 05/09/21 0631  BP: 133/65 128/74 (!) 146/70 (!) 154/77  Pulse: 66 74 66 73  Resp: 18 20 (!) 24 17  Temp: 97.8 F (36.6 C) 98.3 F (36.8 C) 98.4 F (36.9 C) 97.8 F (36.6 C)  TempSrc: Oral Oral Oral   SpO2: 97% 95% 97% 97%  Weight:      Height:        Intake/Output Summary (Last 24 hours) at 05/09/2021 1538 Last data filed at 05/08/2021 1725 Gross per 24 hour  Intake 240 ml  Output --  Net 240 ml    Filed Weights   04/19/21 2130  Weight: 72.1 kg    Examination: General exam: Conversant, in no acute distress Respiratory system: normal chest rise, clear, no audible wheezing Cardiovascular system: regular rhythm, s1-s2 Gastrointestinal system: Nondistended, nontender, pos BS Central nervous system: No seizures, no tremors Extremities: No cyanosis, no joint deformities Skin: No rashes, no pallor Psychiatry: Affect normal // no auditory hallucinations   Data Reviewed: I have personally reviewed following labs and imaging studies  CBC: Recent Labs  Lab 05/04/21 0552 05/08/21 0437 05/09/21 0433  WBC 13.3* 10.6* 10.8*  HGB 11.0* 11.0* 11.9*  HCT 33.9* 34.1* 36.7  MCV 95.5 95.3 93.4  PLT 346 351 818    Basic Metabolic Panel: Recent Labs  Lab 05/03/21 0534 05/04/21 0552 05/07/21 0449 05/08/21 0437 05/09/21 0433  NA 133* 134* 133* 135 134*  K 3.6 3.8 3.5 3.1* 3.9  CL 105 106 105 106 104  CO2 19* 21* 19* 22 22  GLUCOSE 100* 93 96 93 96  BUN 22 14 9 10 11   CREATININE 1.06* 0.90 0.96 1.03* 0.90  CALCIUM 8.8* 8.8* 8.9 8.8* 9.1  MG  --   --   --  1.8  --   PHOS  --   --   --  4.0  --     GFR: Estimated Creatinine Clearance: 51.5 mL/min (by C-G formula based on SCr of 0.9  mg/dL). Liver Function Tests: Recent Labs  Lab 05/09/21 0433  AST 21  ALT 15  ALKPHOS 110  BILITOT 0.6  PROT 6.7  ALBUMIN 3.6    No results for input(s): LIPASE, AMYLASE in the last 168 hours. No results for input(s): AMMONIA in the last 168 hours. Coagulation Profile: No results for input(s): INR, PROTIME in the last 168 hours. Cardiac Enzymes: No results for input(s): CKTOTAL, CKMB, CKMBINDEX, TROPONINI in the last 168 hours. BNP (last 3 results) No results for input(s): PROBNP in the last 8760 hours. HbA1C: No results for input(s): HGBA1C in the last 72 hours. CBG: No results for input(s): GLUCAP in the last 168 hours. Lipid Profile: No results for input(s): CHOL, HDL, LDLCALC, TRIG, CHOLHDL, LDLDIRECT in the last 72 hours. Thyroid Function Tests: No results for input(s): TSH, T4TOTAL, FREET4, T3FREE, THYROIDAB in the last 72 hours. Anemia Panel: No results for input(s): VITAMINB12, FOLATE, FERRITIN, TIBC, IRON, RETICCTPCT in the last 72 hours. Sepsis Labs: No results for input(s):  PROCALCITON, LATICACIDVEN in the last 168 hours.  No results found for this or any previous visit (from the past 240 hour(s)).   Radiology Studies: No results found.  Scheduled Meds:  cholestyramine light  4 g Oral Q24H   diclofenac Sodium  2 g Topical QID   diphenoxylate-atropine  2 tablet Oral QID   feeding supplement ([employer] STANDARD 1.4)  325 mL Oral BID BM   FLUoxetine  20 mg Oral Daily   megestrol  400 mg Oral Daily   metoprolol tartrate  12.5 mg Oral BID   multivitamin with minerals  1 tablet Oral Daily   predniSONE  5 mg Oral Q breakfast   saccharomyces boulardii  250 mg Oral BID   Continuous Infusions:   LOS: 20 days   Marylu Lund, MD [facility] Pager On Amion  If 7PM-7AM, please contact night-coverage 05/09/2021, 3:38 PM

## 2021-05-09 NOTE — TOC Progression Note (Signed)
Transition of Care [facility]) - Progression Note    Patient Details  Name: Sara Barnes MRN: [id number] Date of Birth: [birth year]  [facility] CM/SW Contact  Leeroy Cha, RN Phone Number: 05/09/2021, 8:34 AM  Clinical Narrative:    Hhc through [place].  Following for other toc needs.   Expected Discharge Plan: [place] Barriers to Discharge: No Barriers Identified  Expected Discharge Plan and Services Expected Discharge Plan: [place]   Discharge Planning Services: CM Consult   Living arrangements for the past 2 months: Single Family Home                           HH Arranged: PT [place]: [place] Date [place]: 05/02/21 Time HH Agency Contacted: [phone] Representative spoke with at [place]: [place] ([place]) Interventions    Readmission Risk Interventions No flowsheet data found.

## 2021-05-09 NOTE — Progress Notes (Signed)
PT Cancellation Note  Patient Details Name: Sara Barnes MRN: [id number] DOB: 08-13-1940   Cancelled Treatment:    Reason Eval/Treat Not Completed: Patient declined, no reason specified. Pt was visiting with family/friend and declined therapy this morning. She reported she didn't feel like it and asked for [place] to return this afternoon. Pt acknowledged if she refused therapy this morning we would not likely be able to return until tomorrow.   [name] 05/09/2021, 10:42 AM

## 2021-05-09 NOTE — Progress Notes (Signed)
Nutrition Follow-up  DOCUMENTATION CODES:   Non-severe (moderate) malnutrition in context of chronic illness  INTERVENTION:   -48 hour Calorie Count  -Needs updated weight for admission  -[employer]ms 1.4 PO BID, each provides 455 kcals and 20g protein  -Recommended addition of an appetite stimulant to MD -MD ordered  -Multivitamin with minerals daily  -Pt and pt's sister would like pt to focus on PO intake at this time. Discussed nutrition support.  NUTRITION DIAGNOSIS:   Moderate Malnutrition related to chronic illness (Crohns, breast cancer) as evidenced by energy intake < or equal to 75% for > or equal to 1 month, mild fat depletion, mild muscle depletion.  GOAL:   Patient will meet greater than or equal to 90% of their needs  Not meeting.  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  Patient in room, sister at bedside.  Pt reports she eats very few bites of her meals and feels very full after that. Cannot take in much at a time. Pt has been treated for c.diff, was on vancomycin, this was discontinued on 1/10. Pt reports feeling like the antibiotics took away her appetite. MD agreed to trial appetite stimulant.  Pt states she has some sensation of painful swallowing but  states this is because she doesn't chew her foods up well. Advised her to try chewing each bite 20-30 times before swallowing.   Pt reported in initial assessment on 12/24 that pt she was not eating well for 2 weeks PTA. Poor PO and appetite has lasted for at least a month now.   Spoke to patient about starting nutrition support if her intake doesn't improve. She is not interested in a feeding tube. Has been on TPN before (February 2019). She really wants to try and eat better so she can get home and hopes to avoid any nutrition support.  RD placed order for Calorie count. Explained how this would work with pt and pt's sister. They liked this plan. Pt also agreed to trying plant based protein shake [employer]. Pt does not tolerate milk based supplements. Was drinking Lactaid as well, can continue this with meals.   This morning pt reports eating 1/4 plain bagel with cream cheese (~75 kcals, 2g protein).   On 1/11: pt consumed Lactaid at breakfast (~90 kcals, 8g protein). Nothing was consumed for lunch or dinner.  Admission weight: 158 lbs. Will need updated weight to assess weight status.   Medications: Lomotil, Multivitamin with minerals daily, Florastor, Zofran  Labs reviewed:  Low Na  NUTRITION - FOCUSED PHYSICAL EXAM:  Flowsheet Row Most Recent Value  Orbital Region No depletion  Upper Arm Region Moderate depletion  Thoracic and Lumbar Region Unable to assess  Buccal Region Mild depletion  Temple Region Mild depletion  Clavicle Bone Region Mild depletion  Clavicle and Acromion Bone Region Mild depletion  Scapular  Bone Region Mild depletion  Dorsal Hand Moderate depletion  Patellar Region Unable to assess  Anterior Thigh Region Unable to assess  Posterior Calf Region Unable to assess  Edema (RD Assessment) Mild  [BLE]  Hair Reviewed  [thin]  Mouth Reviewed  [tongue: smooth, pink]  Skin Reviewed       Diet Order:   Diet Order             Diet regular Room service  appropriate? Yes; Fluid consistency: Thin  Diet effective now                   EDUCATION NEEDS:   Education needs have been addressed  Skin:  Skin Assessment: Reviewed RN Assessment  Last BM:  1/12 -per patient  Height:   Ht Readings from Last 1 Encounters:  04/19/21 5' 6"  (1.676 m)    Weight:   Wt Readings from Last 1 Encounters:  04/19/21 72.1 kg    BMI:  Body mass index is 25.66 kg/m.  Estimated Nutritional Needs:   Kcal:  1700-1900kcal/day  Protein:  85-95g/day  Fluid:  1.5-1.8L/day  Clayton Bibles, MS, RD, LDN Inpatient Clinical Dietitian Contact information available via Amion

## 2021-05-10 DIAGNOSIS — E440 Moderate protein-calorie malnutrition: Secondary | ICD-10-CM | POA: Insufficient documentation

## 2021-05-10 DIAGNOSIS — R112 Nausea with vomiting, unspecified: Secondary | ICD-10-CM | POA: Diagnosis not present

## 2021-05-10 LAB — COMPREHENSIVE METABOLIC PANEL
ALT: 16 U/L (ref 0–44)
AST: 19 U/L (ref 15–41)
Albumin: 3.8 g/dL (ref 3.5–5.0)
Alkaline Phosphatase: 117 U/L (ref 38–126)
Anion gap: 7 (ref 5–15)
BUN: 10 mg/dL (ref 8–23)
CO2: 22 mmol/L (ref 22–32)
Calcium: 9.1 mg/dL (ref 8.9–10.3)
Chloride: 104 mmol/L (ref 98–111)
Creatinine, Ser: 0.94 mg/dL (ref 0.44–1.00)
GFR, Estimated: >60 mL/min (ref >60)
Glucose, Bld: 78 mg/dL (ref 70–99)
Potassium: 3.4 mmol/L — ABNORMAL LOW (ref 3.5–5.1)
Sodium: 133 mmol/L — ABNORMAL LOW (ref 135–145)
Total Bilirubin: 0.5 mg/dL (ref 0.3–1.2)
Total Protein: 6.8 g/dL (ref 6.5–8.1)

## 2021-05-10 MED ORDER — POTASSIUM CHLORIDE CRYS ER 20 MEQ PO TBCR
60.0000 meq | EXTENDED_RELEASE_TABLET | Freq: Once | ORAL | Status: AC
  Administered 2021-05-10: 40 meq via ORAL
  Filled 2021-05-10: qty 3

## 2021-05-10 MED ORDER — DRONABINOL 2.5 MG PO CAPS
5.0000 mg | ORAL_CAPSULE | Freq: Two times a day (BID) | ORAL | Status: DC
  Administered 2021-05-10 – 2021-05-16 (×13): 5 mg via ORAL
  Filled 2021-05-10 (×13): qty 2

## 2021-05-10 NOTE — Progress Notes (Signed)
Physical Therapy Treatment Patient Details Name: Sara Barnes MRN: [id number] DOB: [birth year] Today's Date: 05/10/2021   History of Present Illness Patient is a 81 year old female who presented with nausea, vomitting and diarhea. Patient was admitted with intractable nausea and vomitting, C diff and dyspnea. Pt is s/p liver biopsy on 12/28 and CT abdomen shows post liver biopsy bleed which is causing her chest discomfort.  PMH: crohn's disease, R hemicolectomy, SBO, ileostomy revision.    PT Comments    Pt requiring min cues for energy conservation with walking (controlling speed).  Pt declining activity other than walking.     Recommendations for follow up therapy are one component of a multi-disciplinary discharge planning process, led by the attending physician.  Recommendations may be updated based on patient status, additional functional criteria and insurance authorization.  Follow Up Recommendations  Home health PT     Assistance Recommended at Discharge Intermittent Supervision/Assistance  Patient can return home with the following Assistance with cooking/housework   Equipment Recommendations  None recommended by PT    Recommendations for Other Services       Precautions / Restrictions Precautions Precautions: Fall     Mobility  Bed Mobility Overal bed mobility: Modified Independent                  Transfers Overall transfer level: Needs assistance Equipment used: Rolling walker (2 wheels) Transfers: Sit to/from Stand Sit to Stand: Supervision           General transfer comment: Cues for safety    Ambulation/Gait Ambulation/Gait assistance: Supervision Gait Distance (Feet): 200 Feet Assistive device: Rolling walker (2 wheels) Gait Pattern/deviations: Step-through pattern       General Gait Details: min cues for safety and energy conservation; reports weaker than normal   Stairs             Wheelchair Mobility    Modified Rankin  (Stroke Patients Only)       Balance Overall balance assessment: Needs assistance Sitting-balance support: No upper extremity supported;Feet supported Sitting balance-Leahy Scale: Good     Standing balance support: No upper extremity supported;Bilateral upper extremity supported Standing balance-Leahy Scale: Fair Standing balance comment: Could static stand but RW to ambulate                            Cognition Arousal/Alertness: Awake/alert Behavior During Therapy: WFL for tasks assessed/performed Overall Cognitive Status: Within Functional Limits for tasks assessed                                          Exercises      General Comments General comments (skin integrity, edema, etc.): VSS on RA.  Pt reports weaker than baseline.  Tried to encourage further exercise but pt stating "I just walk, that's all I do."      Pertinent Vitals/Pain Pain Assessment: No/denies pain    Home Living                          Prior Function            PT Goals (current goals can now be found in the care plan section) Progress towards PT goals: Progressing toward goals    Frequency    Min 3X/week      PT Plan  Current plan remains appropriate    Co-evaluation              AM-PAC PT "6 Clicks" Mobility   Outcome Measure  Help needed turning from your back to your side while in a flat bed without using bedrails?: None Help needed moving from lying on your back to sitting on the side of a flat bed without using bedrails?: None Help needed moving to and from a bed to a chair (including a wheelchair)?: None Help needed standing up from a chair using your arms (e.g., wheelchair or bedside chair)?: A Little Help needed to walk in hospital room?: A Little Help needed climbing 3-5 steps with a railing? : A Little 6 Click Score: 21    End of Session Equipment Utilized During Treatment: Gait belt Activity Tolerance: Patient tolerated  treatment well Patient left: in bed;with call bell/phone within reach;with bed alarm set;with family/visitor present Nurse Communication: Mobility status PT Visit Diagnosis: Difficulty in walking, not elsewhere classified (R26.2);Muscle weakness (generalized) (M62.81)     Time: 1021-1173 PT Time Calculation (min) (ACUTE ONLY): 10 min  Charges:  $Gait Training: 8-22 mins                     Abran Richard, PT Acute Rehab Services Pager [phone] [facility] 05/10/2021, 1:34 PM

## 2021-05-10 NOTE — Progress Notes (Signed)
Calorie Count Note  48 hour calorie count ordered.  Diet: regular Supplements:  Anda [employer] 1.4 PO BID, each provides 455 kcals and 20g protein  1/12: Breakfast: 75 kcals, 2g protein Lunch: none Dinner: 90 kcals, 4g protein Supplements: 1 [employer] 1.4 = 455 kcals, 20g protein  Total intake: 620 kcal (36% of minimum estimated needs)  26g protein (30% of minimum estimated needs)  1/13: Breakfast: 1/2 ham biscuit from [employer] = 175 kcals, 7g protein Lunch: TBD Dinner: TBD Supplements: TBD -was sipping on [employer] during visit   Nutrition Dx: Moderate Malnutrition related to chronic illness (Crohns, breast cancer) as evidenced by energy intake < or equal to 75% for > or equal to 1 month, mild fat depletion, mild muscle depletion.  Goal: Pt to meet >/= 90% of their estimated nutrition needs   Intervention:  -Calorie Count  -Anda [employer] 1.4 PO BID, each provides 455 kcals and 20g protein -Multivitamin with minerals daily  Sara Bibles, MS, RD, LDN Inpatient Clinical Dietitian Contact information available via Amion

## 2021-05-10 NOTE — Progress Notes (Signed)
PROGRESS NOTE    Sara Barnes  MWN:[id number] DOB: April 05, 1941 DOA: 04/19/2021 PCP: Ann Held, DO    Brief Narrative:  81 year old female with PMH significant for Crohn's disease, depression, insomnia, metastatic breast cancer, with mets to chest wall that has been excised, dyspepsia admitted with C. difficile colitis with complaints of nausea, vomiting and diarrhea.  She s/p biopsy of liver.  Post biopsy patient developed liver hematoma being followed by IR. Now H/H stable  Assessment & Plan:   Principal Problem:   Intractable nausea and vomiting Active Problems:   Depression   Diarrhea   Crohn disease (HCC)   Hyponatremia   Clostridium difficile colitis  C. difficile colitis /diarrhea/Crohn's disease: > Improving Patient was diagnosed with C. difficile colitis. Continue p.o. vancomycin for 14 days Diarrhea initially improved, now again worsened since yesterday. ID does not feel that addition of Flagyl or Dificid would help. She has history of Crohn's disease and had a bloody bowel movement yesterday. Gastroenterology was earlier consulted, recommended stopping Lomotil and cholestyramine. Lomotil is discontinued, patient is currently on cholestyramine. Cont with Lomotil 2 tablets every 6 hours as needed for liquid stools Completed course of PO vancomycin Still with limited PO intake   S/p liver biopsy > hematoma : CT abdomen pelvis showed hematoma status post liver biopsy. General surgery and IR were consulted H&H is stable.  GI and surgery has since signed off.   Intractable nausea: Continue Zofran ODT every 8 hours as needed Reports current antiemetic is effective   History of metastatic breast cancer: Patient has mets to the chest wall that has been excised. CT abdomen pelvis showed new hepatic lesions suspicious for metastatic cancer She is s/p liver biopsy on 04/24/2021 Biopsy confirmed metastatic breast cancer Oncology was made aware and will  follow-up outpatient.   Hypokalemia: Replaced. Continue to monitor.   Acute kidney injury: > Resolved. Resolved with IV hydration.   Renal ultrasound no hydronephrosis. Renal function had remained stable   Acute blood loss anemia Liver laceration since liver biopsy. S/p 1 unit PRBC.  H&H remains stable Lovenox was discontinued   Paroxysmal atrial fibrillation: Likely from ongoing diarrhea, electrolyte imbalance and anemia. Patient had 2 episodes of transient A. fib during hospital stay. Continue as needed metoprolol for rate control. She is not a candidate for anticoagulation due to intra-abdominal hematoma Echocardiogram shows normal LVEF. TSH normal.   Intermittent chest pain: Patient reports intermittent chest pain, abdominal discomfort cramping with these episodes EKG unremarkable, troponin 29> 21 Doubt cardiac cause of chest pain likely atypical  Moderate protein calorie malnutrition Dietitian following Recommendation for 48hr calorie count Cont trial of appetite stimulant per recs Historically had needed TPN in past. Pt declines PEG Dietitian following   DVT prophylaxis: SCD's Code Status: Full Family Communication: Pt in room, family at bedside  Status is: Inpatient  Remains inpatient appropriate because: Severity of illness    Consultants:  Gastroenterology Interventional radiology General surgery Infectious diseases  Procedures:    Antimicrobials: Anti-infectives (From admission, onward)    Start     Dose/Rate Route Frequency Ordered Stop   04/29/21 1000  vancomycin (VANCOCIN) capsule 125 mg        125 mg Oral 4 times daily 04/29/21 0815 05/07/21 2359   04/28/21 1430  metroNIDAZOLE (FLAGYL) tablet 500 mg  Status:  Discontinued        500 mg Oral Every 8 hours 04/28/21 1358 04/29/21 0945   04/25/21 1245  vancomycin (VANCOCIN) 50 mg/mL oral  solution SOLN 125 mg  Status:  Discontinued        125 mg Oral Every 6 hours 04/25/21 1145 04/29/21 0815    04/20/21 1500  vancomycin (VANCOCIN) capsule 125 mg  Status:  Discontinued        125 mg Oral 4 times daily 04/20/21 1405 04/25/21 1145       Subjective: Reports eating more recently  Objective: Vitals:   05/09/21 1400 05/09/21 1952 05/10/21 0543 05/10/21 1312  BP: (!) 148/76 (!) 142/62 (!) 159/84 99/65  Pulse: 76 64 77 72  Resp: 20 18 18 20   Temp: 97.9 F (36.6 C) 98.3 F (36.8 C) 98.1 F (36.7 C) 97.8 F (36.6 C)  TempSrc:  Oral Oral   SpO2: 98% 96% 97% 98%  Weight:   75.9 kg   Height:       No intake or output data in the 24 hours ending 05/10/21 1421  Filed Weights   04/19/21 2130 05/10/21 0543  Weight: 72.1 kg 75.9 kg    Examination: General exam: Awake, laying in bed, in nad Respiratory system: Normal respiratory effort, no wheezing Cardiovascular system: regular rate, s1, s2 Gastrointestinal system: Soft, nondistended, positive BS Central nervous system: CN2-12 grossly intact, strength intact Extremities: Perfused, no clubbing Skin: Normal skin turgor, no notable skin lesions seen Psychiatry: Mood normal // no visual hallucinations   Data Reviewed: I have personally reviewed following labs and imaging studies  CBC: Recent Labs  Lab 05/04/21 0552 05/08/21 0437 05/09/21 0433  WBC 13.3* 10.6* 10.8*  HGB 11.0* 11.0* 11.9*  HCT 33.9* 34.1* 36.7  MCV 95.5 95.3 93.4  PLT 346 351 580    Basic Metabolic Panel: Recent Labs  Lab 05/04/21 0552 05/07/21 0449 05/08/21 0437 05/09/21 0433 05/10/21 0818  NA 134* 133* 135 134* 133*  K 3.8 3.5 3.1* 3.9 3.4*  CL 106 105 106 104 104  CO2 21* 19* 22 22 22   GLUCOSE 93 96 93 96 78  BUN 14 9 10 11 10   CREATININE 0.90 0.96 1.03* 0.90 0.94  CALCIUM 8.8* 8.9 8.8* 9.1 9.1  MG  --   --  1.8  --   --   PHOS  --   --  4.0  --   --     GFR: Estimated Creatinine Clearance: 50.5 mL/min (by C-G formula based on SCr of 0.94 mg/dL). Liver Function Tests: Recent Labs  Lab 05/09/21 0433 05/10/21 0818  AST 21 19   ALT 15 16  ALKPHOS 110 117  BILITOT 0.6 0.5  PROT 6.7 6.8  ALBUMIN 3.6 3.8    No results for input(s): LIPASE, AMYLASE in the last 168 hours. No results for input(s): AMMONIA in the last 168 hours. Coagulation Profile: No results for input(s): INR, PROTIME in the last 168 hours. Cardiac Enzymes: No results for input(s): CKTOTAL, CKMB, CKMBINDEX, TROPONINI in the last 168 hours. BNP (last 3 results) No results for input(s): PROBNP in the last 8760 hours. HbA1C: No results for input(s): HGBA1C in the last 72 hours. CBG: No results for input(s): GLUCAP in the last 168 hours. Lipid Profile: No results for input(s): CHOL, HDL, LDLCALC, TRIG, CHOLHDL, LDLDIRECT in the last 72 hours. Thyroid Function Tests: No results for input(s): TSH, T4TOTAL, FREET4, T3FREE, THYROIDAB in the last 72 hours. Anemia Panel: No results for input(s): VITAMINB12, FOLATE, FERRITIN, TIBC, IRON, RETICCTPCT in the last 72 hours. Sepsis Labs: No results for input(s): PROCALCITON, LATICACIDVEN in the last 168 hours.  No results found for  this or any previous visit (from the past 240 hour(s)).   Radiology Studies: No results found.  Scheduled Meds:  cholestyramine light  4 g Oral Q24H   diclofenac Sodium  2 g Topical QID   diphenoxylate-atropine  2 tablet Oral QID   dronabinol  5 mg Oral BID AC   feeding supplement ([employer] STANDARD 1.4)  325 mL Oral BID BM   FLUoxetine  20 mg Oral Daily   metoprolol tartrate  12.5 mg Oral BID   multivitamin with minerals  1 tablet Oral Daily   predniSONE  5 mg Oral Q breakfast   saccharomyces boulardii  250 mg Oral BID   Continuous Infusions:   LOS: 21 days   Marylu Lund, MD [facility] Pager On Amion  If 7PM-7AM, please contact night-coverage 05/10/2021, 2:21 PM

## 2021-05-10 NOTE — Progress Notes (Signed)
Patient states she feels as if she still does not have an appetite. Patient states the [facility] calorie replacement did have an increase in her bowel movements. Patient noted to have had 3 diarrheal episodes last night and one this morning.

## 2021-05-10 NOTE — Progress Notes (Addendum)
HEMATOLOGY-ONCOLOGY PROGRESS NOTE  ASSESSMENT AND PLAN: 1.  C. Difficile, improving 2.  Intractable nausea, vomiting, diarrhea, improving 3.  Metastatic breast cancer, ER positive, HER2 negative 4.  Liver lesions 5.  Crohn's disease 6.  Post liver biopsy hematoma 7.  Anemia  -Overall improving.  Diarrhea significantly better at this time.  Still with a decreased appetite but try to take in more by mouth.  She has been started on appetite stimulant. -Liver biopsy confirmed metastatic breast cancer, ER positive, HER2 negative.  The patient has inquired about treatment options for her breast cancer.  We again reviewed first-line treatment options including aromatase inhibitors or fulvestrant injections.  She has had concerns about potential diarrhea from treatment due to her Crohn's disease.  She had severe diarrhea when she was on letrozole previously.  Will likely recommend fulvestrant.  We have previously discussed chemotherapy, but due to her advanced age and risk for increased side effects, may be difficult for her.  She is interested in therapy but would like to discuss this more with Dr. Burr Medico later today.  Mikey Bussing, DNP, AGPCNP-BC, AOCNP  SUBJECTIVE: The patient reports that she is feeling somewhat improved.  However, she remains weak.  She had ambulated just prior to my visit.  She reports improvement in her abdominal pain and diarrhea.  Trying to eat more.  Was started on Marinol earlier today.  Oncology History Overview Note  Cancer of lower-inner quadrant of left female breast [facility])   Staging form: Breast, AJCC 7th Edition     Clinical stage from 12/06/2014: Stage IA (T1c, N0, M0) - Unsigned     Pathologic stage from 12/13/2014: Stage IA (T1c, N0, cM0) - Unsigned      Cancer of lower-inner quadrant of left female breast ([place])  11/28/2014 Breast US   Highly suspicious 1.6 cm irregular shadowing mass at the site of palpable concern in the subareolar lower inner quadrant of the  left breast at approximately 630.   11/29/2014 Receptors her2   Estrogen Receptor: 100%, POSITIVE, STRONG STAINING INTENSITY (PERFORMED MANUALLY) Progesterone Receptor: 95%, POSITIVE, STRONG STAINING INTENSITY (PERFORMED MANUALLY) Proliferation Marker Ki67: 10% (PERFORMED MANUALLY), Her2neu negative   11/29/2014 Initial Biopsy   Breast, left, needle core biopsy, subareolar about 6:30 - INVASIVE DUCTAL CARCINOMA, SEE COMMENT.   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   02/05/2015 - 02/26/2015 Radiation Therapy   left breast radiaiton    03/18/2015 -  Anti-estrogen oral therapy   She tried letrozole for 2-3 weeks, could not tolerate and stopped.   07/25/2020 Pathology Results   FINAL MICROSCOPIC DIAGNOSIS: A. SKIN, LEFT CHEST, EXCISION: - Invasive carcinoma involving dermis and subcutaneous soft tissue, consistent with patient's clinical history of invasive ductal carcinoma, see comment - Deep and lateral resection margins are negative for carcinoma  Appears grade I  ADDENDUM: PROGNOSTIC INDICATOR RESULTS: Immunohistochemical and morphometric analysis performed manually The tumor cells are NEGATIVE for Her2 (1+). Estrogen Receptor: POSITIVE, 90%, STRONG STAINING Progesterone Receptor: POSITIVE, 95%, STRONG STAINING Proliferation Marker Ki-67: 5%       REVIEW OF SYSTEMS:   Review  of Systems  Constitutional:  Positive for malaise/fatigue and weight loss. Negative for fever.  HENT: Negative.    Respiratory:  Negative for cough and shortness of breath.   Cardiovascular:   Negative for chest pain.  Gastrointestinal:  Positive for diarrhea. Negative for nausea and vomiting.       Diarrhea overall improved  Skin: Negative.        Continued rash to her left breast  Psychiatric/Behavioral: Negative.     I have reviewed the past medical history, past surgical history, social history and family history with the patient and they are unchanged from previous note.   PHYSICAL EXAMINATION: ECOG PERFORMANCE STATUS: 1 - Symptomatic but completely ambulatory  Vitals:   05/09/21 1952 05/10/21 0543  BP: (!) 142/62 (!) 159/84  Pulse: 64 77  Resp: 18 18  Temp: 98.3 F (36.8 C) 98.1 F (36.7 C)  SpO2: 96% 97%   Filed Weights   04/19/21 2130 05/10/21 0543  Weight: 72.1 kg 75.9 kg    Intake/Output from previous day: No intake/output data recorded.  Physical Exam Constitutional:      General: She is not in acute distress.    Appearance: Normal appearance.  HENT:     Head: Normocephalic.  Cardiovascular:     Rate and Rhythm: Normal rate.  Pulmonary:     Effort: Pulmonary effort is normal. No respiratory distress.  Abdominal:     General: Abdomen is flat. There is no distension.  Skin:    General: Skin is warm and dry.  Neurological:     General: No focal deficit present.     Mental Status: She is alert and oriented to person, place, and time.  Psychiatric:        Mood and Affect: Mood normal.        Thought Content: Thought content normal.        Judgment: Judgment normal.    LABORATORY DATA:  I have reviewed the data as listed CMP Latest Ref Rng & Units 05/10/2021 05/09/2021 05/08/2021  Glucose 70 - 99 mg/dL 78 96 93  BUN 8 - 23 mg/dL 10 11 10   Creatinine 0.44 - 1.00 mg/dL 0.94 0.90 1.03(H)  Sodium 135 - 145 mmol/L 133(L) 134(L) 135  Potassium 3.5 - 5.1 mmol/L 3.4(L) 3.9 3.1(L)  Chloride 98 - 111 mmol/L 104 104 106  CO2 22 - 32 mmol/L 22 22 22   Calcium 8.9 - 10.3 mg/dL 9.1 9.1 8.8(L)  Total Protein 6.5 - 8.1 g/dL 6.8 6.7 -  Total Bilirubin 0.3  - 1.2 mg/dL 0.5 0.6 -  Alkaline Phos 38 - 126 U/L 117 110 -  AST 15 - 41 U/L 19 21 -  ALT 0 - 44 U/L 16 15 -    Lab Results  Component Value Date   WBC 10.8 (H) 05/09/2021   HGB 11.9 (L) 05/09/2021   HCT 36.7 05/09/2021   MCV 93.4 05/09/2021   PLT 399 05/09/2021   NEUTROABS 5.9 04/24/2021    No results found for: CEA1, CEA, CAN199, CA125, PSA1  CT ABDOMEN PELVIS WO CONTRAST  Result Date: 04/30/2021 CLINICAL DATA:  Nausea, vomiting. EXAM: CT ABDOMEN AND PELVIS WITHOUT CONTRAST TECHNIQUE: Multidetector CT imaging of the abdomen and pelvis was performed following the standard protocol without IV contrast. COMPARISON:  April 27, 2021. FINDINGS: Lower chest: Bilateral pleural effusions are noted with adjacent bibasilar atelectasis. Hepatobiliary: No gallstones or biliary dilatation is noted. 10.7 x 7.5 cm hemorrhage is again noted involving the posterior and inferior aspect  of the left hepatic lobe which appears to be enlarged compared to prior exam. Pancreas: Unremarkable. No pancreatic ductal dilatation or surrounding inflammatory changes. Spleen: Normal in size without focal abnormality. Adrenals/Urinary Tract: Adrenal glands are unremarkable. Kidneys are normal, without renal calculi, focal lesion, or hydronephrosis. Bladder is unremarkable. Stomach/Bowel: Stomach appears normal. There is no evidence of bowel obstruction or inflammation. Vascular/Lymphatic: Aortic atherosclerosis. No enlarged abdominal or pelvic lymph nodes. Reproductive: Status post hysterectomy. No adnexal masses. Other: Decreased amount of pelvic hemorrhage is noted compared to prior exam. No hernia is noted. Musculoskeletal: No acute or significant osseous findings. IMPRESSION: Large hemorrhage is seen involving left hepatic lobe which appears to be slightly enlarged compared to prior exam, most likely due to hepatic laceration. There is a slightly decreased amount of hemoperitoneum seen in the pelvis compared to prior exam.  Bilateral pleural effusions are again noted with adjacent bibasilar atelectasis. Electronically Signed   By: Marijo Conception M.D.   On: 04/30/2021 17:48   CT ABDOMEN PELVIS WO CONTRAST  Result Date: 04/27/2021 CLINICAL DATA:  Postop.  Abdominal pain.  Nausea vomiting. EXAM: CT ABDOMEN AND PELVIS WITHOUT CONTRAST TECHNIQUE: Multidetector CT imaging of the abdomen and pelvis was performed following the standard protocol without IV contrast. COMPARISON:  04/19/2021. FINDINGS: Lower chest: Moderate left and small to moderate right pleural effusions. Left greater than right lower lobe opacities consistent with atelectasis. Scattered linear and reticular opacities elsewhere at the lung bases, also likely atelectasis. Pleural effusions have increased in size from the prior CT. Hepatobiliary: Lateral segment of the left lobe appears larger than on the recent prior exam, particularly along its inferior margin. There is a vague area of relative hypoattenuation across the inferior aspect, segment 3. Remainder of the liver is unremarkable. Normal gallbladder. No bile duct dilation. Pancreas: Unremarkable. No pancreatic ductal dilatation or surrounding inflammatory changes. Spleen: Normal in size without focal abnormality. Adrenals/Urinary Tract: Adrenal glands are unremarkable. Kidneys are normal, without renal calculi, focal lesion, or hydronephrosis. Bladder is unremarkable. Stomach/Bowel: Minimal hiatal hernia. Stomach mostly decompressed. There is haziness increased attenuation along the wall of the gastric antrum. Small bowel and colon are normal in caliber. No wall thickening or inflammation. Vascular/Lymphatic: Aortic atherosclerosis. No aneurysm. No enlarged lymph nodes. Reproductive: Status post hysterectomy. No adnexal masses. Other: Small amount of hemoperitoneum primarily collecting in the pelvis.a additional small areas of hyperattenuation lie underneath the left hemidiaphragm suspected to be additional  hemorrhage, abutting the lateral segment the left liver lobe. Musculoskeletal: No fracture or acute finding. IMPRESSION: 1. Suspected hemorrhage from the lateral segment of the left liver lobe. Lateral segment now larger than it was on the previous CT with an area of hypoattenuation in segment 3, which may reflect a contusion/laceration. The round lesion noted in segment 2 on the prior CT is not defined. There is apparent hemorrhage adjacent to the lateral segment, underneath the left hemidiaphragm and adjacent to the stomach. Definitive hemoperitoneum, small in amount, is seen in the pelvis. 2. No other acute abnormality within the abdomen or pelvis. 3. Moderate left and small right pleural effusion with associated lung base dependent opacity consistent with atelectasis. Pleural effusions have increased in size from prior CT. Electronically Signed   By: Lajean Manes M.D.   On: 04/27/2021 15:56   DG Chest 1 View  Result Date: 04/29/2021 CLINICAL DATA:  chest discomfort and shortness of breath EXAM: CHEST  1 VIEW COMPARISON:  Chest x-ray 04/26/2021, CT chest 04/15/2012 FINDINGS: The heart and mediastinal  contours are unchanged. Aortic calcification. Prominent hilar vasculature. Biapical pleural/pulmonary scarring. Hazy airspace opacities of bilateral mid to lower lung zones. Slightly increased interstitial markings. At least small to moderate volume left and trace to small volume right pleural effusions. No pneumothorax. No acute osseous abnormality. IMPRESSION: 1. Hazy airspace opacities of bilateral mid to lower lung zones. At least small to moderate volume left and trace to small volume right pleural effusions. 2. Pulmonary venous congestion. 3.  Aortic Atherosclerosis (ICD10-I70.0). Electronically Signed   By: Iven Finn M.D.   On: 04/29/2021 16:37   DG Chest 1 View  Result Date: 04/26/2021 CLINICAL DATA:  Shortness of breath. EXAM: CHEST  1 VIEW COMPARISON:  Chest x-ray 03/24/2021 FINDINGS:  Moderate size left pleural effusion is unchanged. There is a new small right pleural effusion. There are minimal bibasilar opacities. There is no pneumothorax. Cardiomediastinal silhouette is unchanged, the heart is mildly enlarged. Surgical clips are seen along the left chest wall. No acute fractures are identified. IMPRESSION: 1. New small right pleural effusion. 2. Stable moderate left pleural effusion. 3. Minimal bibasilar atelectasis/airspace disease. Electronically Signed   By: Ronney Asters M.D.   On: 04/26/2021 15:45   DG Chest 1 View  Result Date: 04/22/2021 CLINICAL DATA:  81 year old female with shortness of breath. EXAM: CHEST  1 VIEW COMPARISON:  Chest radiograph 10/30/2016 and earlier. CT Abdomen and Pelvis 04/19/2021. FINDINGS: Portable AP semi upright view at 0607 hours. Some artifact due to bilateral breast implants, but also veiling opacity at the left lung base in keeping with ongoing left pleural effusion demonstrated recently on CT Abdomen and Pelvis. No superimposed pneumothorax or pulmonary edema. No air bronchograms. Allowing for portable technique mediastinal contours appear stable since 2018. Visualized tracheal air column is within normal limits. Chronic left chest wall surgical clips. No acute osseous abnormality identified. IMPRESSION: 1. Ongoing small to moderate left pleural effusion seen recently on CT Abdomen and Pelvis. 2. Artifact from breast implants. No other acute cardiopulmonary abnormality. Electronically Signed   By: Genevie Ann M.D.   On: 04/22/2021 06:51   CT ABDOMEN PELVIS W CONTRAST  Result Date: 04/19/2021 CLINICAL DATA:  Nausea, vomiting, abdominal pain, acute nonlocalized. History of left breast with lumpectomy and radiation. EXAM: CT ABDOMEN AND PELVIS WITH CONTRAST TECHNIQUE: Multidetector CT imaging of the abdomen and pelvis was performed using the standard protocol following bolus administration of intravenous contrast. CONTRAST:  15m OMNIPAQUE IOHEXOL 350  MG/ML SOLN COMPARISON:  CT examination dated November 11, 2019 FINDINGS: Lower chest: Moderate left and small right pleural effusion with left basilar atelectasis. Small hiatal hernia. Hepatobiliary: At least 3 hyperdense masses in the liver. In the left hepatic lobe measuring 2.2 x 2.2 cm (series 2 image 9). Another hypodense structure abutting the diaphragm measuring 1.4 x 1.6 cm (series 2, image 8). Another hyperdense structure in the inferior aspect of the right hepatic lobe (series 2, image 20). These masses are highly suspicious for metastatic disease until proven otherwise. Gallbladder is unremarkable. No biliary ductal dilatation. Pancreas: Unremarkable. No pancreatic ductal dilatation or surrounding inflammatory changes. Spleen: Normal in size without focal abnormality. Adrenals/Urinary Tract: Adrenal glands are unremarkable. Kidneys are normal, without renal calculi, focal lesion, or hydronephrosis. Bladder is unremarkable. Stomach/Bowel: Stomach is within normal limits. Small bowel loops are normal in caliber. Postsurgical changes for prior hemicolectomy. No evidence of bowel wall thickening, distention, or inflammatory changes. Vascular/Lymphatic: Aortic atherosclerosis. No enlarged abdominal or pelvic lymph nodes. Reproductive: Status post hysterectomy. No adnexal masses. Other:  No abdominal wall hernia or abnormality. No abdominopelvic ascites. Musculoskeletal: Mild multilevel degenerative disc disease. No acute osseous abnormality. IMPRESSION: 1. Multiple hypodense hepatic masses, as detailed above, highly suspicious for metastatic disease. Differential include infectious/inflammatory process. Further evaluation with MRI examination with contrast would be helpful. 2. Moderate left and small right pleural effusion. 3. Postsurgical changes for prior hemicolectomy. No bowel obstruction, colitis or diverticulitis. Electronically Signed   By: Keane Police D.O.   On: 04/19/2021 16:13   US RENAL  Result  Date: 04/26/2021 CLINICAL DATA:  Elevated BUN and creatinine.  Short of breath. EXAM: RENAL / URINARY TRACT ULTRASOUND COMPLETE COMPARISON:  CT, 04/19/2021. FINDINGS: Right Kidney: Renal measurements: 11 x 1 x 4.2 x 5.3 cm = volume: 128.7 mL. Echogenicity within normal limits. No mass or hydronephrosis visualized. Left Kidney: Renal measurements: 11.0 x 4.5 x 4.8 cm = volume: 124 mL. Echogenicity within normal limits. No mass or hydronephrosis visualized. Bladder: Unremarkable. Hypo to anechoic areas adjacent to the bladder matter nonspecific, possibly small amounts of peritoneal fluid versus fluid-filled bowel. Other: None. IMPRESSION: 1. Normal sonographic appearance of the kidneys. Electronically Signed   By: Lajean Manes M.D.   On: 04/26/2021 16:35   US BIOPSY (LIVER)  Result Date: 04/24/2021 INDICATION: 81 year old female with a history multiple liver lesions referred for biopsy EXAM: ULTRASOUND-GUIDED BIOPSY LIVER MASS MEDICATIONS: None. ANESTHESIA/SEDATION: Moderate (conscious) sedation was employed during this procedure. A total of Versed 1.5 mg and Fentanyl 100 mcg was administered intravenously by the radiology nurse. Total intra-service moderate Sedation Time: 10 minutes. The patient's level of consciousness and vital signs were monitored continuously by radiology nursing throughout the procedure under my direct supervision. FLUOROSCOPY TIME:  Ultrasound none COMPLICATIONS: None PROCEDURE: Informed written consent was obtained from the patient after a thorough discussion of the procedural risks, benefits and alternatives. All questions were addressed. Maximal Sterile Barrier Technique was utilized including caps, mask, sterile gowns, sterile gloves, sterile drape, hand hygiene and skin antiseptic. A timeout was performed prior to the initiation of the procedure. Ultrasound survey of the right liver lobe performed with images stored and sent to PACs. The subxiphoid region was prepped with  chlorhexidine in a sterile fashion, and a sterile drape was applied covering the operative field. A sterile gown and sterile gloves were used for the procedure. Local anesthesia was provided with 1% Lidocaine. The patient was prepped and draped sterilely and the skin and subcutaneous tissues were generously infiltrated with 1% lidocaine. A 17 gauge introducer needle was then advanced under ultrasound guidance in an intercostal location into the left liver lobe, targeting left liver mass. The stylet was removed, and multiple separate 18 gauge core biopsy were retrieved. Samples were placed into formalin for transportation to the lab. Gel-Foam pledgets were then infused with a small amount of saline for assistance with hemostasis. The needle was removed, and a final ultrasound image was performed. The patient tolerated the procedure well and remained hemodynamically stable throughout. No complications were encountered and no significant blood loss was encounter. IMPRESSION: Status post ultrasound-guided biopsy of left liver mass Signed, Dulcy Fanny. Dellia Nims, RPVI [facility] Electronically Signed   By: Corrie Mckusick D.O.   On: 04/24/2021 14:43   DG CHEST PORT 1 VIEW  Result Date: 04/23/2021 CLINICAL DATA:  Shortness of breath EXAM: PORTABLE CHEST 1 VIEW COMPARISON:  04/22/2021 FINDINGS: Transverse diameter of heart is increased. There is diffuse haziness in the left mid and left lower lung fields.  This may be due to moderate left pleural effusion. Evaluation of left mid and left lower lung fields for infiltrates is limited by the effusion. Right lung is clear. Right lateral CP angle is clear. There is no pneumothorax. IMPRESSION: No significant interval changes are noted in the homogeneous opacity in the left mid and left lower lung fields suggesting moderate left pleural effusion. Possibility of underlying atelectasis/pneumonitis in the left mid and left  lower lung fields is not excluded. Electronically Signed   By: Elmer Picker M.D.   On: 04/23/2021 10:03   ECHOCARDIOGRAM COMPLETE  Result Date: 04/27/2021    ECHOCARDIOGRAM REPORT   Patient Name:   JULLIA MULLIGAN Date of Exam: 04/27/2021 Medical Rec #:  [id number]      Height:       66.0 in Accession #:    [id number]     Weight:       159.0 lb Date of Birth:  Sep 19, 1940       BSA:          1.814 m Patient Age:    27 years       BP:           121/64 mmHg Patient Gender: F              HR:           87 bpm. Exam Location:  Inpatient Procedure: 2D Echo, Cardiac Doppler and Color Doppler Indications:    Abnormal ECG R94.31  History:        Patient has prior history of Echocardiogram examinations, most                 recent 06/22/2016. Risk Factors:Dyslipidemia. GERD. Past history                 of breast cancer.  Sonographer:    Johny Chess RDCS Referring Phys: [id number] [place]  Sonographer Comments: Image acquisition challenging due to breast implants. Exam ended per patients request. IMPRESSIONS  1. Poor image quality due to breast implants and patient stopped exam before done.  2. Left ventricular ejection fraction, by estimation, is 60 to 65%. The left ventricle has normal function. The left ventricle has no regional wall motion abnormalities. Left ventricular diastolic parameters were normal.  3. Right ventricular systolic function is normal. The right ventricular size is normal. There is normal pulmonary artery systolic pressure.  4. The mitral valve is normal in structure. No evidence of mitral valve regurgitation. No evidence of mitral stenosis.  5. The aortic valve was not well visualized. Aortic valve regurgitation is not visualized. No aortic stenosis is present.  6. The inferior vena cava is normal in size with greater than 50% respiratory variability, suggesting right atrial pressure of 3 mmHg. FINDINGS  Left Ventricle: Left ventricular ejection fraction, by estimation, is 60 to  65%. The left ventricle has normal function. The left ventricle has no regional wall motion abnormalities. The left ventricular internal cavity size was normal in size. There is  no left ventricular hypertrophy. Left ventricular diastolic parameters were normal. Right Ventricle: The right ventricular size is normal. No increase in right ventricular wall thickness. Right ventricular systolic function is normal. There is normal pulmonary artery systolic pressure. The tricuspid regurgitant velocity is 2.28 m/s, and  with an assumed right atrial pressure of 8 mmHg, the estimated right ventricular systolic pressure is 18.2 mmHg. Left Atrium: Left atrial size was normal in size. Right Atrium: Right atrial size was  normal in size. Pericardium: There is no evidence of pericardial effusion. Mitral Valve: The mitral valve is normal in structure. No evidence of mitral valve regurgitation. No evidence of mitral valve stenosis. Tricuspid Valve: The tricuspid valve is normal in structure. Tricuspid valve regurgitation is not demonstrated. No evidence of tricuspid stenosis. Aortic Valve: The aortic valve was not well visualized. Aortic valve regurgitation is not visualized. No aortic stenosis is present. Pulmonic Valve: The pulmonic valve was normal in structure. Pulmonic valve regurgitation is not visualized. No evidence of pulmonic stenosis. Aorta: The aortic root is normal in size and structure. Venous: The inferior vena cava is normal in size with greater than 50% respiratory variability, suggesting right atrial pressure of 3 mmHg. IAS/Shunts: The interatrial septum was not assessed. Additional Comments: Poor image quality due to breast implants and patient stopped exam before done.  LEFT VENTRICLE PLAX 2D LVIDd:         3.70 cm   Diastology LVIDs:         2.30 cm   LV e' medial:    8.03 cm/s LV PW:         1.00 cm   LV E/e' medial:  11.8 LV IVS:        1.10 cm   LV e' lateral:   7.62 cm/s LVOT diam:     1.90 cm   LV E/e'  lateral: 12.4 LV SV:         57 LV SV Index:   32 LVOT Area:     2.84 cm  RIGHT VENTRICLE RV S prime:     16.30 cm/s TAPSE (M-mode): 1.6 cm LEFT ATRIUM             Index        RIGHT ATRIUM           Index LA diam:        3.50 cm 1.93 cm/m   RA Area:     12.90 cm LA Vol (A2C):   32.9 ml 18.14 ml/m  RA Volume:   27.80 ml  15.33 ml/m LA Vol (A4C):   29.7 ml 16.37 ml/m LA Biplane Vol: 31.6 ml 17.42 ml/m  AORTIC VALVE LVOT Vmax:   125.00 cm/s LVOT Vmean:  66.600 cm/s LVOT VTI:    0.202 m  AORTA Ao Root diam: 2.90 cm Ao Asc diam:  2.80 cm MITRAL VALVE               TRICUSPID VALVE MV Area (PHT): 3.99 cm    TR Peak grad:   20.8 mmHg MV Decel Time: 190 msec    TR Vmax:        228.00 cm/s MV E velocity: 94.70 cm/s MV A velocity: 90.00 cm/s  SHUNTS MV E/A ratio:  1.05        Systemic VTI:  0.20 m                            Systemic Diam: 1.90 cm Jenkins Rouge MD Electronically signed by Jenkins Rouge MD Signature Date/Time: 04/27/2021/12:08:59 PM    Final      No future appointments.     LOS: 21 days    Addendum  I have seen the patient, examined her. I agree with the assessment and and plan and have edited the notes.   Patient is slowly improving, her diarrhea has much improved, she is trying to eat more.  Her pain is overall controlled.  I again reviewed systemic treatment options for her metastatic breast cancer, and recommended fulvestrant injections and Ibrance (likely with low dose). Pt is not ready to start treatment, she would like to recover better, see me back in office, then make the final decision. She previously declined treatment and lost follow up in April 2022. She told me she will probably go home in next 2-3 days, I will set up her office follow with me in about 2 weeks.   Truitt Merle  05/10/2021

## 2021-05-10 NOTE — TOC Progression Note (Signed)
Oncology Discharge Planning Admission Note  [facility] at [facility] Address: [place], [place], [place] [address] Hours of Operation:  Nena Polio, Monday - Friday  Clinic Contact Information:  [phone]) [phone]  Oncology Care Team: Medical Oncologist:  Dr. Jolayne Panther  Myrtha Mantis, NP   informed that the oncology services were requested in house. Is aware of this hospital admission dated 04/19/21, and the cancer center will follow [facility] inpatient care to assist with discharge planning as indicated by the oncologist.  We will reach out closer to discharge date to arrange follow up care.  Disclaimer:  This [facility] note does not imply a formal consult request has been made by the admitting attending for this admission or there will be an inpatient consult completed by oncology.  Please request oncology consults as per standard process as indicated.

## 2021-05-11 ENCOUNTER — Inpatient Hospital Stay (HOSPITAL_COMMUNITY): Payer: Medicare HMO

## 2021-05-11 ENCOUNTER — Encounter (HOSPITAL_COMMUNITY): Payer: Self-pay | Admitting: Internal Medicine

## 2021-05-11 LAB — COMPREHENSIVE METABOLIC PANEL WITH GFR
ALT: 14 U/L (ref 0–44)
AST: 15 U/L (ref 15–41)
Albumin: 3.4 g/dL — ABNORMAL LOW (ref 3.5–5.0)
Alkaline Phosphatase: 106 U/L (ref 38–126)
Anion gap: 6 (ref 5–15)
BUN: 13 mg/dL (ref 8–23)
CO2: 23 mmol/L (ref 22–32)
Calcium: 8.9 mg/dL (ref 8.9–10.3)
Chloride: 106 mmol/L (ref 98–111)
Creatinine, Ser: 0.87 mg/dL (ref 0.44–1.00)
GFR, Estimated: >60 mL/min
Glucose, Bld: 89 mg/dL (ref 70–99)
Potassium: 3.9 mmol/L (ref 3.5–5.1)
Sodium: 135 mmol/L (ref 135–145)
Total Bilirubin: 0.5 mg/dL (ref 0.3–1.2)
Total Protein: 6.4 g/dL — ABNORMAL LOW (ref 6.5–8.1)

## 2021-05-11 LAB — CBC
HCT: 33.5 % — ABNORMAL LOW (ref 36.0–46.0)
Hemoglobin: 11 g/dL — ABNORMAL LOW (ref 12.0–15.0)
MCH: 30.6 pg (ref 26.0–34.0)
MCHC: 32.8 g/dL (ref 30.0–36.0)
MCV: 93.3 fL (ref 80.0–100.0)
Platelets: 380 K/uL (ref 150–400)
RBC: 3.59 MIL/uL — ABNORMAL LOW (ref 3.87–5.11)
RDW: 12 % (ref 11.5–15.5)
WBC: 13.6 K/uL — ABNORMAL HIGH (ref 4.0–10.5)
nRBC: 0 % (ref 0.0–0.2)

## 2021-05-11 MED ORDER — IOHEXOL 300 MG/ML  SOLN
75.0000 mL | Freq: Once | INTRAMUSCULAR | Status: AC | PRN
  Administered 2021-05-11: 75 mL via INTRAVENOUS

## 2021-05-11 NOTE — Progress Notes (Signed)
°   05/11/21 1400  Mobility  Activity Ambulated in hall  Level of Assistance Contact guard assist, steadying assist  Assistive Device Front wheel walker  Distance Ambulated (ft) 250 ft  Mobility Ambulated with assistance in hallway  Mobility Response Tolerated well  Mobility performed by Mobility specialist  $Mobility charge 1 Mobility   Pt agreeable to mobilize this afternoon. Ambulated about 257f in hall with RW, tolerated well/fair. T took 3 standing rest breaks throughout the session secondary to wheezing. Instructed pt to practice pursed lip breathing. Monitored O2 sat for full length of session. Never dropped below 94%. Left pt in bed with call bell at side and sister present.   KLos NopalitosSpecialist Acute Rehab Services Office: [phone]

## 2021-05-11 NOTE — Progress Notes (Signed)
Brief Nutrition Note  Received page on on-call/weekend pager from RN asking RD to follow up with pt today per MD request. RD working remotely.  Noted pt is on a 48-hour calorie count and was found to be meeting 36% of kcal needs and 30% of protein needs on 05/09/21. Due to RDs working remotely on weekends, plan was for calorie count to be followed up on by unit RD on Monday, 05/13/21. No meal completions recorded in pt's chart since 05/08/21 so RD unable to use that information to estimate kcal and protein intake. Discussed pt with MD via secure chat who requested RD follow up with pt today as pt is nearing discharge and incomplete calorie count is preventing her from discharging.  RD was able to reach pt via phone call to room to obtain diet recall. Pt reports consuming 50% of ham biscuit from [employer] for breakfast on 05/10/21. She reports that she did not order any lunch and did not eat any lunch on 05/10/21. She states that for dinner on 05/10/21 she consumed 75% of a peanut butter and jelly sandwich, 100% of a piece of cake, and 100% of a carton of milk. She reports consuming 1 [employer] supplement over the course of the day. Please see documentation below for kcals and protein that this provides.  Pt reports that so far today, she consumed 50% of scrambled eggs and 100% of toast with jelly for breakfast this morning. She is currently drinking a [employer]. Please see documentation below for kcals and protein that this provides.  Pt states that she believes the appetite stimulant is working. She has questions regarding the timing of the dosing and whether she will receive a prescription for this at discharge. Will pass along questions to MD. She also states that she has finished her course of antibiotics and feels like she wants to eat food for the first time in months. Overall pt reports improvement in PO intake and appetite.  05/10/21: Breakfast: 175 kcal, 7 grams of protein Lunch: 0  kcal, 0 grams of protein Dinner: 439 kcal, 19 grams of protein Supplements: 455 kcal, 20 grams of protein  Total intake: 1069 kcal (63% of minimum estimated needs) 46 grams of protein (54% of minimum estimated needs)  05/11/21: Breakfast: 160 kcal, 8 grams of protein Lunch: TBD Dinner: TBD Supplements: TBD (pt currently drinking [employer] supplement)  Pt's PO intake has improved significantly over the last day. Suspect that it will continue to improve now that pt has completed antibiotics and is on an appetite stimulant. All of the above information was communicated with MD.   Gustavus Bryant, MS, RD, LDN Inpatient Clinical Dietitian Please see AMiON for contact information.

## 2021-05-11 NOTE — Progress Notes (Signed)
PROGRESS NOTE    JACKILYN UMPHLETT  IFO:[id number] DOB: 01/15/1941 DOA: 04/19/2021 PCP: Ann Held, DO    Brief Narrative:  81 year old female with PMH significant for Crohn's disease, depression, insomnia, metastatic breast cancer, with mets to chest wall that has been excised, dyspepsia admitted with C. difficile colitis with complaints of nausea, vomiting and diarrhea.  She s/p biopsy of liver.  Post biopsy patient developed liver hematoma being followed by IR. Now H/H stable  Assessment & Plan:   Principal Problem:   Intractable nausea and vomiting Active Problems:   Depression   Diarrhea   Crohn disease (HCC)   Hyponatremia   Clostridium difficile colitis   Malnutrition of moderate degree  C. difficile colitis /diarrhea/Crohn's disease: > Improving Patient was diagnosed with C. difficile colitis, completed 14 days of po vanc Diarrhea improved ID does not feel that addition of Flagyl or Dificid would help. She has history of Crohn's disease and had a bloody bowel movement yesterday. Gastroenterology was earlier consulted, recommended stopping Lomotil and cholestyramine. Currently on cholestyramine and Lomotil 2 tablets every 6 hours as needed for liquid stools Cont to encourage PO intake   S/p liver biopsy > hematoma : CT abdomen pelvis showed hematoma status post liver biopsy. General surgery and IR were consulted H&H remains stable.  GI and surgery has since signed off.   Intractable nausea: Continue Zofran ODT every 8 hours as needed Reports current antiemetic is effective   History of metastatic breast cancer: Patient has mets to the chest wall that has been excised. CT abdomen pelvis showed new hepatic lesions suspicious for metastatic cancer She is s/p liver biopsy on 04/24/2021 Biopsy confirmed metastatic breast cancer Oncology was made aware and will follow-up outpatient.   Hypokalemia: Replaced. Continue to monitor.   Acute kidney injury: >  Resolved. Resolved with IV hydration.   Renal ultrasound no hydronephrosis. Renal function had remained stable   Acute blood loss anemia Liver laceration since liver biopsy. S/p 1 unit PRBC.  H&H has remained stable Lovenox was discontinued   Paroxysmal atrial fibrillation: Likely from ongoing diarrhea, electrolyte imbalance and anemia. Patient had 2 episodes of transient A. fib during hospital stay. Continue as needed metoprolol for rate control. She is not a candidate for anticoagulation due to intra-abdominal hematoma Echocardiogram shows normal LVEF. TSH normal.   Intermittent chest pain: Patient reports intermittent chest pain, abdominal discomfort cramping with these episodes EKG unremarkable, troponin 29> 21 Doubt cardiac cause of chest pain likely atypical  Moderate protein calorie malnutrition Dietitian following Cont trial of appetite stimulant per recs Pt reports improved appetite with addition of marinol Discussed with Dietitian. Adequate caloiric intake now noted. TNA no longer being considered  L sided pleural effusion -On ambulation trials, pt noted to have increased dyspnea while maintaining O2 sats -Ordered and reviewed STAT CXR. Findings of mod L sided effusion with atelectasis -Chart reviewed. Pt had L sided effusion also noted on 1/3 CXR -Discussed with family. As pt is symptomatic, would benefit from therapeutic/diagnostic thoracentesis -However, given recent hemorrhage from liver biopsy, pt is apprehensive about any further procedures -for now, will request CT chest to better evaluate effusion and for metastatic survey given hx breast cancer   DVT prophylaxis: SCD's Code Status: Full Family Communication: Pt in room, family at bedside  Status is: Inpatient  Remains inpatient appropriate because: Severity of illness    Consultants:  Gastroenterology Interventional radiology General surgery Infectious diseases  Procedures:     Antimicrobials: Anti-infectives (From  admission, onward)    Start     Dose/Rate Route Frequency Ordered Stop   04/29/21 1000  vancomycin (VANCOCIN) capsule 125 mg        125 mg Oral 4 times daily 04/29/21 0815 05/07/21 2359   04/28/21 1430  metroNIDAZOLE (FLAGYL) tablet 500 mg  Status:  Discontinued        500 mg Oral Every 8 hours 04/28/21 1358 04/29/21 0945   04/25/21 1245  vancomycin (VANCOCIN) 50 mg/mL oral solution SOLN 125 mg  Status:  Discontinued        125 mg Oral Every 6 hours 04/25/21 1145 04/29/21 0815   04/20/21 1500  vancomycin (VANCOCIN) capsule 125 mg  Status:  Discontinued        125 mg Oral 4 times daily 04/20/21 1405 04/25/21 1145       Subjective: States appetite has improved since starting "appetite stimulant"  Objective: Vitals:   05/10/21 2012 05/11/21 0350 05/11/21 1300 05/11/21 1454  BP: 130/89 127/65  122/60  Pulse: 69 68  78  Resp: 20 20  20   Temp: 98.7 F (37.1 C) 98.5 F (36.9 C)  98.3 F (36.8 C)  TempSrc: Oral Oral  Oral  SpO2: 97% 97% 95% 97%  Weight:      Height:       No intake or output data in the 24 hours ending 05/11/21 1649  Filed Weights   04/19/21 2130 05/10/21 0543  Weight: 72.1 kg 75.9 kg    Examination: General exam: Conversant, in no acute distress Respiratory system: normal chest rise, clear, no audible wheezing Cardiovascular system: regular rhythm, s1-s2 Gastrointestinal system: Nondistended, nontender, pos BS Central nervous system: No seizures, no tremors Extremities: No cyanosis, no joint deformities Skin: No rashes, no pallor Psychiatry: Affect normal // no auditory hallucinations   Data Reviewed: I have personally reviewed following labs and imaging studies  CBC: Recent Labs  Lab 05/08/21 0437 05/09/21 0433 05/11/21 0550  WBC 10.6* 10.8* 13.6*  HGB 11.0* 11.9* 11.0*  HCT 34.1* 36.7 33.5*  MCV 95.3 93.4 93.3  PLT 351 399 488    Basic Metabolic Panel: Recent Labs  Lab 05/07/21 0449  05/08/21 0437 05/09/21 0433 05/10/21 0818 05/11/21 0550  NA 133* 135 134* 133* 135  K 3.5 3.1* 3.9 3.4* 3.9  CL 105 106 104 104 106  CO2 19* 22 22 22 23   GLUCOSE 96 93 96 78 89  BUN 9 10 11 10 13   CREATININE 0.96 1.03* 0.90 0.94 0.87  CALCIUM 8.9 8.8* 9.1 9.1 8.9  MG  --  1.8  --   --   --   PHOS  --  4.0  --   --   --     GFR: Estimated Creatinine Clearance: 54.5 mL/min (by C-G formula based on SCr of 0.87 mg/dL). Liver Function Tests: Recent Labs  Lab 05/09/21 0433 05/10/21 0818 05/11/21 0550  AST 21 19 15   ALT 15 16 14   ALKPHOS 110 117 106  BILITOT 0.6 0.5 0.5  PROT 6.7 6.8 6.4*  ALBUMIN 3.6 3.8 3.4*    No results for input(s): LIPASE, AMYLASE in the last 168 hours. No results for input(s): AMMONIA in the last 168 hours. Coagulation Profile: No results for input(s): INR, PROTIME in the last 168 hours. Cardiac Enzymes: No results for input(s): CKTOTAL, CKMB, CKMBINDEX, TROPONINI in the last 168 hours. BNP (last 3 results) No results for input(s): PROBNP in the last 8760 hours. HbA1C: No results for input(s): HGBA1C in  the last 72 hours. CBG: No results for input(s): GLUCAP in the last 168 hours. Lipid Profile: No results for input(s): CHOL, HDL, LDLCALC, TRIG, CHOLHDL, LDLDIRECT in the last 72 hours. Thyroid Function Tests: No results for input(s): TSH, T4TOTAL, FREET4, T3FREE, THYROIDAB in the last 72 hours. Anemia Panel: No results for input(s): VITAMINB12, FOLATE, FERRITIN, TIBC, IRON, RETICCTPCT in the last 72 hours. Sepsis Labs: No results for input(s): PROCALCITON, LATICACIDVEN in the last 168 hours.  No results found for this or any previous visit (from the past 240 hour(s)).   Radiology Studies: DG CHEST PORT 1 VIEW  Result Date: 05/11/2021 CLINICAL DATA:  Shortness of breath EXAM: PORTABLE CHEST 1 VIEW COMPARISON:  04/29/2021 FINDINGS: Transverse diameter of heart is increased. There are no signs of alveolar pulmonary edema. There is opacification  of left mid and left lower lung fields, possibly suggesting moderate left pleural effusion. Possibility of underlying atelectasis/pneumonia is not excluded. There is improvement in aeration of right lower lung fields. There is minimal blunting of right lateral CP angle. There is no pneumothorax. Surgical clips are seen in the left chest wall. IMPRESSION: There is diffuse haziness in the left mid and left lower lung fields suggesting moderate left pleural effusion and possibly underlying atelectasis/pneumonia. There is interval improvement in aeration in the right lower lung fields. There is possible minimal right pleural effusion. Electronically Signed   By: Elmer Picker M.D.   On: 05/11/2021 15:48    Scheduled Meds:  cholestyramine light  4 g Oral Q24H   diclofenac Sodium  2 g Topical QID   diphenoxylate-atropine  2 tablet Oral QID   dronabinol  5 mg Oral BID AC   feeding supplement ([employer] STANDARD 1.4)  325 mL Oral BID BM   FLUoxetine  20 mg Oral Daily   metoprolol tartrate  12.5 mg Oral BID   multivitamin with minerals  1 tablet Oral Daily   predniSONE  5 mg Oral Q breakfast   saccharomyces boulardii  250 mg Oral BID   Continuous Infusions:   LOS: 22 days   Marylu Lund, MD [facility] Pager On Amion  If 7PM-7AM, please contact night-coverage 05/11/2021, 4:49 PM

## 2021-05-12 LAB — COMPREHENSIVE METABOLIC PANEL
ALT: 14 U/L (ref 0–44)
AST: 17 U/L (ref 15–41)
Albumin: 3.3 g/dL — ABNORMAL LOW (ref 3.5–5.0)
Alkaline Phosphatase: 113 U/L (ref 38–126)
Anion gap: 8 (ref 5–15)
BUN: 15 mg/dL (ref 8–23)
CO2: 24 mmol/L (ref 22–32)
Calcium: 8.8 mg/dL — ABNORMAL LOW (ref 8.9–10.3)
Chloride: 103 mmol/L (ref 98–111)
Creatinine, Ser: 1 mg/dL (ref 0.44–1.00)
GFR, Estimated: 57 mL/min — ABNORMAL LOW (ref >60)
Glucose, Bld: 87 mg/dL (ref 70–99)
Potassium: 3.8 mmol/L (ref 3.5–5.1)
Sodium: 135 mmol/L (ref 135–145)
Total Bilirubin: 0.5 mg/dL (ref 0.3–1.2)
Total Protein: 6.2 g/dL — ABNORMAL LOW (ref 6.5–8.1)

## 2021-05-12 LAB — CBC
HCT: 32.9 % — ABNORMAL LOW (ref 36.0–46.0)
Hemoglobin: 10.8 g/dL — ABNORMAL LOW (ref 12.0–15.0)
MCH: 30.4 pg (ref 26.0–34.0)
MCHC: 32.8 g/dL (ref 30.0–36.0)
MCV: 92.7 fL (ref 80.0–100.0)
Platelets: 372 10*3/uL (ref 150–400)
RBC: 3.55 MIL/uL — ABNORMAL LOW (ref 3.87–5.11)
RDW: 12 % (ref 11.5–15.5)
WBC: 13.5 10*3/uL — ABNORMAL HIGH (ref 4.0–10.5)
nRBC: 0 % (ref 0.0–0.2)

## 2021-05-12 MED ORDER — FENTANYL 12 MCG/HR TD PT72
1.0000 | MEDICATED_PATCH | TRANSDERMAL | Status: AC
  Administered 2021-05-12: 1 via TRANSDERMAL
  Filled 2021-05-12: qty 1

## 2021-05-12 NOTE — Progress Notes (Signed)
PROGRESS NOTE    Sara Barnes  UKG:[id number] DOB: [birth year] DOA: 04/19/2021 PCP: Ann Held, DO    Brief Narrative:  81 year old female with PMH significant for Crohn's disease, depression, insomnia, metastatic breast cancer, with mets to chest wall that has been excised, dyspepsia admitted with C. difficile colitis with complaints of nausea, vomiting and diarrhea.  She s/p biopsy of liver.  Post biopsy patient developed liver hematoma being followed by IR. Now H/H stable  Assessment & Plan:   Principal Problem:   Intractable nausea and vomiting Active Problems:   Depression   Diarrhea   Crohn disease (HCC)   Hyponatremia   Clostridium difficile colitis   Malnutrition of moderate degree  C. difficile colitis /diarrhea/Crohn's disease: > Improving Patient was diagnosed with C. difficile colitis, completed 14 days of po vanc Diarrhea improved ID does not feel that addition of Flagyl or Dificid would help. She has history of Crohn's disease and had a bloody bowel movement yesterday. Gastroenterology was earlier consulted, recommended stopping Lomotil and cholestyramine. Currently on cholestyramine and Lomotil 2 tablets every 6 hours as needed for liquid stools Cont to encourage PO intake   S/p liver biopsy > hematoma : CT abdomen pelvis with hematoma status post liver biopsy. General surgery and IR were consulted H&H remains stable.  GI and surgery has since signed off. Liver biopsy results per below   Intractable nausea: Continue Zofran ODT every 8 hours as needed Reports current antiemetic is effective   History of metastatic breast cancer: Patient has mets to the chest wall that has been excised. CT abdomen pelvis showed new hepatic lesions suspicious for metastatic cancer She is s/p liver biopsy on 04/24/2021 Biopsy confirmed metastatic disease Oncology was made aware and will follow-up outpatient. CT chest on 1/14 demonstrates L sided effusion with  multiple metastatic bone lesions as well. Have requested US guided thoracentesis and will f/u cytology   Hypokalemia: Replaced. Continue to monitor.   Acute kidney injury: > Resolved. Resolved with IV hydration.   Renal ultrasound no hydronephrosis. Renal function had remained stable   Acute blood loss anemia Liver laceration since liver biopsy. S/p 1 unit PRBC.  H&H has remained stable Lovenox was discontinued   Paroxysmal atrial fibrillation: Likely from ongoing diarrhea, electrolyte imbalance and anemia. Patient had 2 episodes of transient A. fib during hospital stay. Continue as needed metoprolol for rate control. She is not a candidate for anticoagulation due to intra-abdominal hematoma Echocardiogram shows normal LVEF. TSH normal.   Intermittent chest pain: Patient reports intermittent chest pain, abdominal discomfort cramping with these episodes EKG unremarkable, troponin 29> 21 See below. Metastatic bone lesions seen on CT chest, corresponding to location of pt's chest pain Have added fentanyl patch for additional pain relief  Moderate protein calorie malnutrition Dietitian following Cont trial of appetite stimulant per recs Pt reports improved appetite with addition of marinol Discussed with Dietitian. Adequate caloiric intake now noted. TNA no longer being considered  L sided pleural effusion -On ambulation trials, pt noted to have increased dyspnea while maintaining O2 sats -Ordered and reviewed STAT CXR. Findings of mod L sided effusion with atelectasis -Chart reviewed. Pt had L sided effusion also noted on 1/3 CXR -Ordered and reviewed chest CT with pt and family. Findings notable for sizable L sided effusion causing lung collapse with multiple bone mets bony lytic lesions involving L 5th rib, L T5 transverse process and L aspect of T10 vertebral body   DVT prophylaxis: SCD's Code Status: Full  Family Communication: Pt in room, family at bedside  Status is:  Inpatient  Remains inpatient appropriate because: Severity of illness    Consultants:  Gastroenterology Interventional radiology General surgery Infectious diseases  Procedures:    Antimicrobials: Anti-infectives (From admission, onward)    Start     Dose/Rate Route Frequency Ordered Stop   04/29/21 1000  vancomycin (VANCOCIN) capsule 125 mg        125 mg Oral 4 times daily 04/29/21 0815 05/07/21 2359   04/28/21 1430  metroNIDAZOLE (FLAGYL) tablet 500 mg  Status:  Discontinued        500 mg Oral Every 8 hours 04/28/21 1358 04/29/21 0945   04/25/21 1245  vancomycin (VANCOCIN) 50 mg/mL oral solution SOLN 125 mg  Status:  Discontinued        125 mg Oral Every 6 hours 04/25/21 1145 04/29/21 0815   04/20/21 1500  vancomycin (VANCOCIN) capsule 125 mg  Status:  Discontinued        125 mg Oral 4 times daily 04/20/21 1405 04/25/21 1145       Subjective: Still complaining of L sided chest pains  Objective: Vitals:   05/11/21 1454 05/11/21 2032 05/12/21 0549 05/12/21 1416  BP: 122/60 129/64 127/62 139/72  Pulse: 78 75 65 66  Resp: 20 16 16 20   Temp: 98.3 F (36.8 C) 97.7 F (36.5 C) 97.6 F (36.4 C) 98 F (36.7 C)  TempSrc: Oral Oral Oral Oral  SpO2: 97% 97% 96% 97%  Weight:      Height:       No intake or output data in the 24 hours ending 05/12/21 1605  Filed Weights   04/19/21 2130 05/10/21 0543  Weight: 72.1 kg 75.9 kg    Examination: General exam: Awake, laying in bed, in nad Respiratory system: Normal respiratory effort, no wheezing Cardiovascular system: regular rate, s1, s2 Gastrointestinal system: Soft, nondistended, positive BS Central nervous system: CN2-12 grossly intact, strength intact Extremities: Perfused, no clubbing Skin: Normal skin turgor, no notable skin lesions seen Psychiatry: Mood normal // no visual hallucinations   Data Reviewed: I have personally reviewed following labs and imaging studies  CBC: Recent Labs  Lab 05/08/21 0437  05/09/21 0433 05/11/21 0550 05/12/21 0515  WBC 10.6* 10.8* 13.6* 13.5*  HGB 11.0* 11.9* 11.0* 10.8*  HCT 34.1* 36.7 33.5* 32.9*  MCV 95.3 93.4 93.3 92.7  PLT 351 399 380 350    Basic Metabolic Panel: Recent Labs  Lab 05/08/21 0437 05/09/21 0433 05/10/21 0818 05/11/21 0550 05/12/21 0515  NA 135 134* 133* 135 135  K 3.1* 3.9 3.4* 3.9 3.8  CL 106 104 104 106 103  CO2 22 22 22 23 24   GLUCOSE 93 96 78 89 87  BUN 10 11 10 13 15   CREATININE 1.03* 0.90 0.94 0.87 1.00  CALCIUM 8.8* 9.1 9.1 8.9 8.8*  MG 1.8  --   --   --   --   PHOS 4.0  --   --   --   --     GFR: Estimated Creatinine Clearance: 47.5 mL/min (by C-G formula based on SCr of 1 mg/dL). Liver Function Tests: Recent Labs  Lab 05/09/21 0433 05/10/21 0818 05/11/21 0550 05/12/21 0515  AST 21 19 15 17   ALT 15 16 14 14   ALKPHOS 110 117 106 113  BILITOT 0.6 0.5 0.5 0.5  PROT 6.7 6.8 6.4* 6.2*  ALBUMIN 3.6 3.8 3.4* 3.3*    No results for input(s): LIPASE, AMYLASE in the last  168 hours. No results for input(s): AMMONIA in the last 168 hours. Coagulation Profile: No results for input(s): INR, PROTIME in the last 168 hours. Cardiac Enzymes: No results for input(s): CKTOTAL, CKMB, CKMBINDEX, TROPONINI in the last 168 hours. BNP (last 3 results) No results for input(s): PROBNP in the last 8760 hours. HbA1C: No results for input(s): HGBA1C in the last 72 hours. CBG: No results for input(s): GLUCAP in the last 168 hours. Lipid Profile: No results for input(s): CHOL, HDL, LDLCALC, TRIG, CHOLHDL, LDLDIRECT in the last 72 hours. Thyroid Function Tests: No results for input(s): TSH, T4TOTAL, FREET4, T3FREE, THYROIDAB in the last 72 hours. Anemia Panel: No results for input(s): VITAMINB12, FOLATE, FERRITIN, TIBC, IRON, RETICCTPCT in the last 72 hours. Sepsis Labs: No results for input(s): PROCALCITON, LATICACIDVEN in the last 168 hours.  No results found for this or any previous visit (from the past 240 hour(s)).    Radiology Studies: CT CHEST W CONTRAST  Result Date: 05/11/2021 CLINICAL DATA:  Pleural effusion, liver lesions, malignancy suspected EXAM: CT CHEST WITH CONTRAST TECHNIQUE: Multidetector CT imaging of the chest was performed during intravenous contrast administration. RADIATION DOSE REDUCTION: This exam was performed according to the departmental dose-optimization program which includes automated exposure control, adjustment of the mA and/or kV according to patient size and/or use of iterative reconstruction technique. CONTRAST:  75m OMNIPAQUE IOHEXOL 300 MG/ML  SOLN COMPARISON:  04/15/2012, 04/30/2021, 05/11/2021 FINDINGS: Cardiovascular: The heart and great vessels are unremarkable without pericardial effusion. No evidence of thoracic aortic aneurysm or dissection. Mediastinum/Nodes: Thyroid, trachea, and esophagus are grossly unremarkable. Right hilar adenopathy measuring up to 13 x 20 mm, reference image 60/2. Subcentimeter left hilar and subcarinal lymph nodes are also noted. Lungs/Pleura: There are bilateral pleural effusions, left greater than right. Compressive atelectasis within the lower lobes, left greater than right. Scattered ground-glass airspace disease within the right upper lobe may be inflammatory or infectious. No pneumothorax. The central airways are patent. Upper Abdomen: The hypodensity within the left lobe liver has become more well-defined, measuring approximately 9.2 x 6.7 cm, consistent with resolving subcapsular hepatic hematoma. Stable hypodense lesions more superiorly within the left lobe liver image 116/2 and inferolateral right lobe liver image 146/2, concerning for metastatic disease. Please refer to previous biopsy results. No other acute upper abdominal findings. Musculoskeletal: Destructive lytic lesions are seen within the left posterior fifth rib and left T5 transverse process, as well as within the left aspect of the T10 vertebral body, consistent with bony metastases.  No other acute bony abnormalities. No acute fractures. Reconstructed images demonstrate no additional findings. IMPRESSION: 1. Bilateral pleural effusions and lower lobe compressive atelectasis, left greater than right. 2. Nonspecific right hilar lymphadenopathy. 3. Bony lytic lesions involving the left posterior fifth rib, left T5 transverse process, and left aspect of the T10 vertebral body, consistent with bony metastases. 4. Patchy right upper lobe ground-glass airspace disease, which may be inflammatory or infectious. 5. Slight decrease in size of the left lobe subcapsular liver hematoma. No evidence of active hemorrhage. 6. Stable liver hypodensities consistent with presumed metastatic disease. Please correlate with previous biopsy results. Electronically Signed   By: MRanda NgoM.D.   On: 05/11/2021 21:20   DG CHEST PORT 1 VIEW  Result Date: 05/11/2021 CLINICAL DATA:  Shortness of breath EXAM: PORTABLE CHEST 1 VIEW COMPARISON:  04/29/2021 FINDINGS: Transverse diameter of heart is increased. There are no signs of alveolar pulmonary edema. There is opacification of left mid and left lower lung fields, possibly  suggesting moderate left pleural effusion. Possibility of underlying atelectasis/pneumonia is not excluded. There is improvement in aeration of right lower lung fields. There is minimal blunting of right lateral CP angle. There is no pneumothorax. Surgical clips are seen in the left chest wall. IMPRESSION: There is diffuse haziness in the left mid and left lower lung fields suggesting moderate left pleural effusion and possibly underlying atelectasis/pneumonia. There is interval improvement in aeration in the right lower lung fields. There is possible minimal right pleural effusion. Electronically Signed   By: Elmer Picker M.D.   On: 05/11/2021 15:48    Scheduled Meds:  cholestyramine light  4 g Oral Q24H   diclofenac Sodium  2 g Topical QID   diphenoxylate-atropine  2 tablet Oral QID    dronabinol  5 mg Oral BID AC   feeding supplement ([employer] STANDARD 1.4)  325 mL Oral BID BM   fentaNYL  1 patch Transdermal Q72H   FLUoxetine  20 mg Oral Daily   metoprolol tartrate  12.5 mg Oral BID   multivitamin with minerals  1 tablet Oral Daily   predniSONE  5 mg Oral Q breakfast   saccharomyces boulardii  250 mg Oral BID   Continuous Infusions:   LOS: 23 days   Marylu Lund, MD [facility] Pager On Amion  If 7PM-7AM, please contact night-coverage 05/12/2021, 4:05 PM

## 2021-05-13 ENCOUNTER — Telehealth: Payer: Self-pay | Admitting: Hematology

## 2021-05-13 ENCOUNTER — Inpatient Hospital Stay (HOSPITAL_COMMUNITY): Payer: Medicare HMO

## 2021-05-13 LAB — CBC
HCT: 33.2 % — ABNORMAL LOW (ref 36.0–46.0)
Hemoglobin: 10.8 g/dL — ABNORMAL LOW (ref 12.0–15.0)
MCH: 30.7 pg (ref 26.0–34.0)
MCHC: 32.5 g/dL (ref 30.0–36.0)
MCV: 94.3 fL (ref 80.0–100.0)
Platelets: 347 10*3/uL (ref 150–400)
RBC: 3.52 MIL/uL — ABNORMAL LOW (ref 3.87–5.11)
RDW: 12 % (ref 11.5–15.5)
WBC: 11.3 10*3/uL — ABNORMAL HIGH (ref 4.0–10.5)
nRBC: 0 % (ref 0.0–0.2)

## 2021-05-13 LAB — COMPREHENSIVE METABOLIC PANEL
ALT: 14 U/L (ref 0–44)
AST: 16 U/L (ref 15–41)
Albumin: 3.2 g/dL — ABNORMAL LOW (ref 3.5–5.0)
Alkaline Phosphatase: 104 U/L (ref 38–126)
Anion gap: 7 (ref 5–15)
BUN: 14 mg/dL (ref 8–23)
CO2: 25 mmol/L (ref 22–32)
Calcium: 8.8 mg/dL — ABNORMAL LOW (ref 8.9–10.3)
Chloride: 102 mmol/L (ref 98–111)
Creatinine, Ser: 0.94 mg/dL (ref 0.44–1.00)
GFR, Estimated: >60 mL/min (ref >60)
Glucose, Bld: 94 mg/dL (ref 70–99)
Potassium: 4.1 mmol/L (ref 3.5–5.1)
Sodium: 134 mmol/L — ABNORMAL LOW (ref 135–145)
Total Bilirubin: 0.4 mg/dL (ref 0.3–1.2)
Total Protein: 6.2 g/dL — ABNORMAL LOW (ref 6.5–8.1)

## 2021-05-13 LAB — BODY FLUID CELL COUNT WITH DIFFERENTIAL
Eos, Fluid: 0 %
Lymphs, Fluid: 81 %
Monocyte-Macrophage-Serous Fluid: 12 % — ABNORMAL LOW (ref 50–90)
Neutrophil Count, Fluid: 7 % (ref 0–25)
Total Nucleated Cell Count, Fluid: 985 cu mm (ref 0–1000)

## 2021-05-13 LAB — LACTATE DEHYDROGENASE, PLEURAL OR PERITONEAL FLUID: LD, Fluid: 125 U/L — ABNORMAL HIGH (ref 3–23)

## 2021-05-13 LAB — PROTEIN, PLEURAL OR PERITONEAL FLUID: Total protein, fluid: 3.8 g/dL

## 2021-05-13 LAB — LACTATE DEHYDROGENASE: LDH: 182 U/L (ref 98–192)

## 2021-05-13 MED ORDER — LIDOCAINE HCL 1 % IJ SOLN
INTRAMUSCULAR | Status: AC
  Administered 2021-05-13: 10 mL
  Filled 2021-05-13: qty 20

## 2021-05-13 NOTE — Telephone Encounter (Signed)
Scheduled appointment per 1/16 scheduling message. Left message.

## 2021-05-13 NOTE — Progress Notes (Signed)
Nutrition Follow-up  DOCUMENTATION CODES:   Non-severe (moderate) malnutrition in context of chronic illness  INTERVENTION:   -[employer] 1.4 PO BID, each provides 455 kcals and 20g protein  -Multivitamin with minerals daily  NUTRITION DIAGNOSIS:   Moderate Malnutrition related to chronic illness (Crohns, breast cancer) as evidenced by energy intake < or equal to 75% for > or equal to 1 month, mild fat depletion, mild muscle depletion.  Ongoing.  GOAL:   Patient will meet greater than or equal to 90% of their needs  Progressing.  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  Patient's calorie count was completed over the weekend. Was meeting ~50% of kcal and protein needs at that time.   Pt out of room at time of visit. Per sister in room, pt having thoracentesis. She reports pt has really responded to the Marinol and wants to continue this as it has helped her appetite. Pt continues to drink [employer]. Last BM yesterday.   Admission weight: 158 lbs. Last recorded weight 1/13: 167 lbs.  Medications: Lomotil, Marinol, Multivitamin with minerals daily, Florastor  Labs reviewed:  Low Na  Diet Order:   Diet Order             Diet regular Room service appropriate? Yes; Fluid consistency: Thin  Diet effective now                   EDUCATION NEEDS:   Education needs  have been addressed  Skin:  Skin Assessment: Skin Integrity Issues: Skin Integrity Issues:: Other (Comment) Other: MASD of perineum  Last BM:  1/15  Height:   Ht Readings from Last 1 Encounters:  04/19/21 5' 6"  (1.676 m)    Weight:   Wt Readings from Last 1 Encounters:  05/10/21 75.9 kg    Ideal Body Weight:  59 kg  BMI:  Body mass index is 27.01 kg/m.  Estimated Nutritional Needs:   Kcal:  1700-1900kcal/day  Protein:  85-95g/day  Fluid:  1.5-1.8L/day  Clayton Bibles, MS, RD, LDN Inpatient Clinical Dietitian Contact information available via Amion

## 2021-05-13 NOTE — Procedures (Addendum)
Ultrasound-guided diagnostic and therapeutic left sided thoracentesis performed yielding 700 milliliters of straw colored fluid. No immediate complications.   Diagnostic fluid was sent to the lab for further analysis. Follow-up chest x-ray pending. EBL is < 51m. Patient unable to tolerate additional fluid removal.

## 2021-05-13 NOTE — Progress Notes (Signed)
PROGRESS NOTE    Sara Barnes  YTK:[id number] DOB: Sep 20, 1940 DOA: 04/19/2021 PCP: Ann Held, DO    Brief Narrative:  81 year old female with PMH significant for Crohn's disease, depression, insomnia, metastatic breast cancer, with mets to chest wall that has been excised, dyspepsia admitted with C. difficile colitis with complaints of nausea, vomiting and diarrhea.  She s/p biopsy of liver.  Post biopsy patient developed liver hematoma being followed by IR. Now H/H stable  Assessment & Plan:   Principal Problem:   Intractable nausea and vomiting Active Problems:   Depression   Diarrhea   Crohn disease (HCC)   Hyponatremia   Clostridium difficile colitis   Malnutrition of moderate degree  C. difficile colitis /diarrhea/Crohn's disease: > Improving Patient was diagnosed with C. difficile colitis, completed 14 days of po vanc Diarrhea improved ID does not feel that addition of Flagyl or Dificid would help. She has history of Crohn's disease and had a bloody bowel movement yesterday. Gastroenterology was earlier consulted, recommended stopping Lomotil and cholestyramine. Currently on cholestyramine and Lomotil 2 tablets every 6 hours as needed for liquid stools Cont to encourage PO intake   S/p liver biopsy > hematoma : CT abdomen pelvis with hematoma status post liver biopsy. General surgery and IR were consulted H&H remains stable.  GI and surgery has since signed off. Liver biopsy results per below   Intractable nausea: Continue Zofran ODT every 8 hours as needed Cont current antiemetic   History of metastatic breast cancer: Patient has mets to the chest wall that has been excised. CT abdomen pelvis showed new hepatic lesions suspicious for metastatic cancer She is s/p liver biopsy on 04/24/2021 Biopsy confirmed metastatic disease Oncology was made aware and will follow-up outpatient. CT chest on 1/14 demonstrates L sided effusion with multiple metastatic  bone lesions as well. See below, pt now s/p thoracentesis yielding exudative fluid   Hypokalemia: Replaced. Continue to monitor.   Acute kidney injury: > Resolved. Resolved with IV hydration.   Renal ultrasound no hydronephrosis. Renal function had remained stable   Acute blood loss anemia Liver laceration since liver biopsy. S/p 1 unit PRBC.  H&H has remained stable Lovenox was discontinued   Paroxysmal atrial fibrillation: Likely from ongoing diarrhea, electrolyte imbalance and anemia. Patient had 2 episodes of transient A. fib during hospital stay. Continue as needed metoprolol for rate control. She is not a candidate for anticoagulation due to intra-abdominal hematoma Echocardiogram shows normal LVEF. TSH normal.   Intermittent chest pain: Patient reports intermittent chest pain, abdominal discomfort cramping with these episodes EKG unremarkable, troponin 29> 21 See below. Metastatic bone lesions seen on CT chest, corresponding to location of pt's chest pain Have added fentanyl patch for additional pain relief, cont to titrate analgesia for comfort  Moderate protein calorie malnutrition Dietitian following Cont trial of appetite stimulant per recs Pt reports improved appetite with addition of marinol Discussed with Dietitian. Adequate caloiric intake now noted. TNA no longer being considered  L sided pleural effusion with exudative fluid -On ambulation trials, pt noted to have increased dyspnea while maintaining O2 sats -Ordered and reviewed STAT CXR. Findings of mod L sided effusion with atelectasis -Chart reviewed. Pt had L sided effusion also noted on 1/3 CXR -Ordered and reviewed chest CT with pt and family. Findings notable for sizable L sided effusion causing lung collapse with multiple bone mets bony lytic lesions involving L 5th rib, L T5 transverse process and L aspect of T10 vertebral body -Pt  is now s/p thoracentesis 05/13/21 yielding 700cc fluid. Fluid analysis  reviewed, consistent with exudative fluid. Concern for malignant effusion. Cytology and culture pending   DVT prophylaxis: SCD's Code Status: Full Family Communication: Pt in room, family at bedside  Status is: Inpatient  Remains inpatient appropriate because: Severity of illness    Consultants:  Gastroenterology Interventional radiology General surgery Infectious diseases  Procedures:  Thoracentesis 05/13/21  Antimicrobials: Anti-infectives (From admission, onward)    Start     Dose/Rate Route Frequency Ordered Stop   04/29/21 1000  vancomycin (VANCOCIN) capsule 125 mg        125 mg Oral 4 times daily 04/29/21 0815 05/07/21 2359   04/28/21 1430  metroNIDAZOLE (FLAGYL) tablet 500 mg  Status:  Discontinued        500 mg Oral Every 8 hours 04/28/21 1358 04/29/21 0945   04/25/21 1245  vancomycin (VANCOCIN) 50 mg/mL oral solution SOLN 125 mg  Status:  Discontinued        125 mg Oral Every 6 hours 04/25/21 1145 04/29/21 0815   04/20/21 1500  vancomycin (VANCOCIN) capsule 125 mg  Status:  Discontinued        125 mg Oral 4 times daily 04/20/21 1405 04/25/21 1145       Subjective: Reports chest pain seems somewhat improved with addition of fentanyl patch  Objective: Vitals:   05/13/21 0907 05/13/21 1100 05/13/21 1135 05/13/21 1154  BP: 122/65 138/86 137/71 119/64  Pulse: 63   75  Resp:    20  Temp:    98.7 F (37.1 C)  TempSrc:    Oral  SpO2:    99%  Weight:      Height:        Intake/Output Summary (Last 24 hours) at 05/13/2021 1530 Last data filed at 05/13/2021 0950 Gross per 24 hour  Intake 236 ml  Output --  Net 236 ml    Filed Weights   04/19/21 2130 05/10/21 0543  Weight: 72.1 kg 75.9 kg    Examination: General exam: Conversant, in no acute distress Respiratory system: normal chest rise, clear, no audible wheezing Cardiovascular system: regular rhythm, s1-s2 Gastrointestinal system: Nondistended, nontender, pos BS Central nervous system: No seizures,  no tremors Extremities: No cyanosis, no joint deformities Skin: No rashes, no pallor Psychiatry: Affect normal // no auditory hallucinations   Data Reviewed: I have personally reviewed following labs and imaging studies  CBC: Recent Labs  Lab 05/08/21 0437 05/09/21 0433 05/11/21 0550 05/12/21 0515 05/13/21 0501  WBC 10.6* 10.8* 13.6* 13.5* 11.3*  HGB 11.0* 11.9* 11.0* 10.8* 10.8*  HCT 34.1* 36.7 33.5* 32.9* 33.2*  MCV 95.3 93.4 93.3 92.7 94.3  PLT 351 399 380 372 892    Basic Metabolic Panel: Recent Labs  Lab 05/08/21 0437 05/09/21 0433 05/10/21 0818 05/11/21 0550 05/12/21 0515 05/13/21 0501  NA 135 134* 133* 135 135 134*  K 3.1* 3.9 3.4* 3.9 3.8 4.1  CL 106 104 104 106 103 102  CO2 22 22 22 23 24 25   GLUCOSE 93 96 78 89 87 94  BUN 10 11 10 13 15 14   CREATININE 1.03* 0.90 0.94 0.87 1.00 0.94  CALCIUM 8.8* 9.1 9.1 8.9 8.8* 8.8*  MG 1.8  --   --   --   --   --   PHOS 4.0  --   --   --   --   --     GFR: Estimated Creatinine Clearance: 50.5 mL/min (by C-G formula based on  SCr of 0.94 mg/dL). Liver Function Tests: Recent Labs  Lab 05/09/21 0433 05/10/21 0818 05/11/21 0550 05/12/21 0515 05/13/21 0501  AST 21 19 15 17 16   ALT 15 16 14 14 14   ALKPHOS 110 117 106 113 104  BILITOT 0.6 0.5 0.5 0.5 0.4  PROT 6.7 6.8 6.4* 6.2* 6.2*  ALBUMIN 3.6 3.8 3.4* 3.3* 3.2*    No results for input(s): LIPASE, AMYLASE in the last 168 hours. No results for input(s): AMMONIA in the last 168 hours. Coagulation Profile: No results for input(s): INR, PROTIME in the last 168 hours. Cardiac Enzymes: No results for input(s): CKTOTAL, CKMB, CKMBINDEX, TROPONINI in the last 168 hours. BNP (last 3 results) No results for input(s): PROBNP in the last 8760 hours. HbA1C: No results for input(s): HGBA1C in the last 72 hours. CBG: No results for input(s): GLUCAP in the last 168 hours. Lipid Profile: No results for input(s): CHOL, HDL, LDLCALC, TRIG, CHOLHDL, LDLDIRECT in the last 72  hours. Thyroid Function Tests: No results for input(s): TSH, T4TOTAL, FREET4, T3FREE, THYROIDAB in the last 72 hours. Anemia Panel: No results for input(s): VITAMINB12, FOLATE, FERRITIN, TIBC, IRON, RETICCTPCT in the last 72 hours. Sepsis Labs: No results for input(s): PROCALCITON, LATICACIDVEN in the last 168 hours.  No results found for this or any previous visit (from the past 240 hour(s)).   Radiology Studies: CT CHEST W CONTRAST  Result Date: 05/11/2021 CLINICAL DATA:  Pleural effusion, liver lesions, malignancy suspected EXAM: CT CHEST WITH CONTRAST TECHNIQUE: Multidetector CT imaging of the chest was performed during intravenous contrast administration. RADIATION DOSE REDUCTION: This exam was performed according to the departmental dose-optimization program which includes automated exposure control, adjustment of the mA and/or kV according to patient size and/or use of iterative reconstruction technique. CONTRAST:  61m OMNIPAQUE IOHEXOL 300 MG/ML  SOLN COMPARISON:  04/15/2012, 04/30/2021, 05/11/2021 FINDINGS: Cardiovascular: The heart and great vessels are unremarkable without pericardial effusion. No evidence of thoracic aortic aneurysm or dissection. Mediastinum/Nodes: Thyroid, trachea, and esophagus are grossly unremarkable. Right hilar adenopathy measuring up to 13 x 20 mm, reference image 60/2. Subcentimeter left hilar and subcarinal lymph nodes are also noted. Lungs/Pleura: There are bilateral pleural effusions, left greater than right. Compressive atelectasis within the lower lobes, left greater than right. Scattered ground-glass airspace disease within the right upper lobe may be inflammatory or infectious. No pneumothorax. The central airways are patent. Upper Abdomen: The hypodensity within the left lobe liver has become more well-defined, measuring approximately 9.2 x 6.7 cm, consistent with resolving subcapsular hepatic hematoma. Stable hypodense lesions more superiorly within the  left lobe liver image 116/2 and inferolateral right lobe liver image 146/2, concerning for metastatic disease. Please refer to previous biopsy results. No other acute upper abdominal findings. Musculoskeletal: Destructive lytic lesions are seen within the left posterior fifth rib and left T5 transverse process, as well as within the left aspect of the T10 vertebral body, consistent with bony metastases. No other acute bony abnormalities. No acute fractures. Reconstructed images demonstrate no additional findings. IMPRESSION: 1. Bilateral pleural effusions and lower lobe compressive atelectasis, left greater than right. 2. Nonspecific right hilar lymphadenopathy. 3. Bony lytic lesions involving the left posterior fifth rib, left T5 transverse process, and left aspect of the T10 vertebral body, consistent with bony metastases. 4. Patchy right upper lobe ground-glass airspace disease, which may be inflammatory or infectious. 5. Slight decrease in size of the left lobe subcapsular liver hematoma. No evidence of active hemorrhage. 6. Stable liver hypodensities consistent with  presumed metastatic disease. Please correlate with previous biopsy results. Electronically Signed   By: Randa Ngo M.D.   On: 05/11/2021 21:20   DG CHEST PORT 1 VIEW  Result Date: 05/13/2021 CLINICAL DATA:  Status post post thoracentesis. EXAM: PORTABLE CHEST 1 VIEW COMPARISON:  05/11/2021 FINDINGS: 1121 hours. Interval decrease in left pleural effusion, now small in size. No evidence for pneumothorax. Right lung clear. The cardio pericardial silhouette is enlarged. Bones are diffusely demineralized. IMPRESSION: Interval decrease in left pleural effusion status post thoracentesis. No pneumothorax. Electronically Signed   By: Misty Stanley M.D.   On: 05/13/2021 11:55   US THORACENTESIS ASP PLEURAL SPACE W/IMG GUIDE  Result Date: 05/13/2021 INDICATION: Patient with history of shortness of breath found to have bilateral pleural effusions.  Left greater than right. Request is for therapeutic and diagnostic thoracentesis EXAM: ULTRASOUND GUIDED DIAGNOSTIC AND THERAPEUTIC THORACENTESIS MEDICATIONS: Lidocaine 1% 10 ML COMPLICATIONS: None immediate. PROCEDURE: An ultrasound guided thoracentesis was thoroughly discussed with the patient and questions answered. The benefits, risks, alternatives and complications were also discussed. The patient understands and wishes to proceed with the procedure. Written consent was obtained. Ultrasound was performed to localize and mark an adequate pocket of fluid in the left chest. The area was then prepped and draped in the normal sterile fashion. 1% Lidocaine was used for local anesthesia. Under ultrasound guidance a 6 Fr Safe-T-Centesis catheter was introduced. Thoracentesis was performed. The catheter was removed and a dressing applied. FINDINGS: A total of approximately 700 mL of straw-colored fluid was removed. Patient unable to tolerate additional fluid removal at this time. Samples were sent to the laboratory as requested by the clinical team. IMPRESSION: Successful ultrasound guided diagnostic and therapeutic left-sided thoracentesis yielding 700 mL of pleural fluid. Read by: Rushie Nyhan, NP Electronically Signed   By: Jacqulynn Cadet M.D.   On: 05/13/2021 11:57    Scheduled Meds:  cholestyramine light  4 g Oral Q24H   diclofenac Sodium  2 g Topical QID   diphenoxylate-atropine  2 tablet Oral QID   dronabinol  5 mg Oral BID AC   feeding supplement ([employer] STANDARD 1.4)  325 mL Oral BID BM   fentaNYL  1 patch Transdermal Q72H   FLUoxetine  20 mg Oral Daily   metoprolol tartrate  12.5 mg Oral BID   multivitamin with minerals  1 tablet Oral Daily   predniSONE  5 mg Oral Q breakfast   saccharomyces boulardii  250 mg Oral BID   Continuous Infusions:   LOS: 24 days   Marylu Lund, MD [facility] Pager On Amion  If 7PM-7AM, please contact night-coverage 05/13/2021, 3:30 PM

## 2021-05-13 NOTE — Progress Notes (Signed)
Scheduling message sent to arrange for hospital follow-up with Dr. Burr Medico in approximately 2 weeks.  Mikey Bussing, DNP, AGPCNP-BC, AOCNP

## 2021-05-14 DIAGNOSIS — R112 Nausea with vomiting, unspecified: Secondary | ICD-10-CM | POA: Diagnosis not present

## 2021-05-14 LAB — CBC
HCT: 33.6 % — ABNORMAL LOW (ref 36.0–46.0)
Hemoglobin: 10.7 g/dL — ABNORMAL LOW (ref 12.0–15.0)
MCH: 30.2 pg (ref 26.0–34.0)
MCHC: 31.8 g/dL (ref 30.0–36.0)
MCV: 94.9 fL (ref 80.0–100.0)
Platelets: 337 10*3/uL (ref 150–400)
RBC: 3.54 MIL/uL — ABNORMAL LOW (ref 3.87–5.11)
RDW: 12 % (ref 11.5–15.5)
WBC: 12 10*3/uL — ABNORMAL HIGH (ref 4.0–10.5)
nRBC: 0 % (ref 0.0–0.2)

## 2021-05-14 LAB — COMPREHENSIVE METABOLIC PANEL
ALT: 14 U/L (ref 0–44)
AST: 15 U/L (ref 15–41)
Albumin: 3.1 g/dL — ABNORMAL LOW (ref 3.5–5.0)
Alkaline Phosphatase: 107 U/L (ref 38–126)
Anion gap: 6 (ref 5–15)
BUN: 17 mg/dL (ref 8–23)
CO2: 25 mmol/L (ref 22–32)
Calcium: 8.6 mg/dL — ABNORMAL LOW (ref 8.9–10.3)
Chloride: 101 mmol/L (ref 98–111)
Creatinine, Ser: 0.94 mg/dL (ref 0.44–1.00)
GFR, Estimated: >60 mL/min (ref >60)
Glucose, Bld: 94 mg/dL (ref 70–99)
Potassium: 4.1 mmol/L (ref 3.5–5.1)
Sodium: 132 mmol/L — ABNORMAL LOW (ref 135–145)
Total Bilirubin: 0.6 mg/dL (ref 0.3–1.2)
Total Protein: 6 g/dL — ABNORMAL LOW (ref 6.5–8.1)

## 2021-05-14 LAB — CYTOLOGY - NON PAP

## 2021-05-14 MED ORDER — FENTANYL 25 MCG/HR TD PT72
1.0000 | MEDICATED_PATCH | TRANSDERMAL | Status: DC
  Administered 2021-05-15: 1 via TRANSDERMAL
  Filled 2021-05-14: qty 1

## 2021-05-14 MED ORDER — DIPHENOXYLATE-ATROPINE 2.5-0.025 MG PO TABS
1.0000 | ORAL_TABLET | Freq: Two times a day (BID) | ORAL | Status: DC
  Administered 2021-05-14 – 2021-05-16 (×4): 1 via ORAL
  Filled 2021-05-14 (×4): qty 1

## 2021-05-14 MED ORDER — FULVESTRANT 250 MG/5ML IM SOSY
500.0000 mg | PREFILLED_SYRINGE | Freq: Once | INTRAMUSCULAR | Status: AC
  Administered 2021-05-15: 500 mg via INTRAMUSCULAR
  Filled 2021-05-14: qty 10

## 2021-05-14 NOTE — Progress Notes (Signed)
SATURATION QUALIFICATIONS: (This note is used to comply with regulatory documentation for home oxygen)  Patient Saturations on Room Air at Rest = 99%  Patient Saturations on Room Air while Ambulating = 96%

## 2021-05-14 NOTE — Progress Notes (Signed)
PROGRESS NOTE    Sara Barnes  NGE:[id number] DOB: [birth year] DOA: 04/19/2021 PCP: Ann Held, DO    Brief Narrative:  81 year old female with PMH significant for Crohn's disease, depression, insomnia, metastatic breast cancer, with mets to chest wall that has been excised, dyspepsia admitted with C. difficile colitis with complaints of nausea, vomiting and diarrhea.  She s/p biopsy of liver.  Post biopsy patient developed liver hematoma being followed by IR. Now H/H stable  Assessment & Plan:   Principal Problem:   Intractable nausea and vomiting Active Problems:   Depression   Diarrhea   Crohn disease (HCC)   Hyponatremia   Clostridium difficile colitis   Malnutrition of moderate degree  C. difficile colitis /diarrhea/Crohn's disease: > Improving Patient was diagnosed with C. difficile colitis, completed 14 days of po vanc Diarrhea improved ID does not feel that addition of Flagyl or Dificid would help. She has history of Crohn's disease and had a bloody bowel movement yesterday. Gastroenterology was earlier consulted, recommended stopping Lomotil and cholestyramine. Currently on cholestyramine and Lomotil 2 tablets every 6 hours as needed for liquid stools. Pt reports decreasing BM. Will decrease lomotil to 1 tab BID Cont to encourage PO intake   S/p liver biopsy > hematoma : CT abdomen pelvis with hematoma status post liver biopsy. General surgery and IR were consulted H&H remains stable.  GI and surgery has since signed off. Liver biopsy results per below   Intractable nausea: Continue Zofran ODT every 8 hours as needed Cont current antiemetic   History of metastatic breast cancer: Patient has mets to the chest wall that has been excised. CT abdomen pelvis showed new hepatic lesions suspicious for metastatic cancer She is s/p liver biopsy on 04/24/2021 Biopsy confirmed metastatic disease CT chest on 1/14 demonstrates L sided effusion with multiple  metastatic bone lesions as well. See below, pt now s/p thoracentesis yielding exudative fluid, concern for likely malignant effusion Oncology following. Per family, anticipate beginning chemo here on 1/18   Hypokalemia: Replaced. Continue to monitor.   Acute kidney injury: > Resolved. Resolved with IV hydration.   Renal ultrasound no hydronephrosis. Renal function has since remained stable   Acute blood loss anemia Liver laceration since liver biopsy. S/p 1 unit PRBC.  H&H has remained stable Lovenox was discontinued   Paroxysmal atrial fibrillation: Likely from ongoing diarrhea, electrolyte imbalance and anemia. Patient had 2 episodes of transient A. fib during hospital stay. Continue as needed metoprolol for rate control. She is not a candidate for anticoagulation due to intra-abdominal hematoma Echocardiogram shows normal LVEF. TSH normal.   Intermittent chest pain: Patient reports intermittent chest pain, abdominal discomfort cramping with these episodes EKG unremarkable, troponin 29> 21 See below. Metastatic bone lesions seen on CT chest, corresponding to location of pt's chest pain Have added fentanyl patch for additional pain relief, cont to titrate analgesia for comfort  Moderate protein calorie malnutrition Dietitian following Cont trial of appetite stimulant per recs Pt reports improved appetite with addition of marinol Discussed with Dietitian. Adequate caloiric intake now noted. TNA no longer being considered  L sided pleural effusion with exudative fluid -On ambulation trials, pt noted to have increased dyspnea while maintaining O2 sats -Ordered and reviewed STAT CXR. Findings of mod L sided effusion with atelectasis -Chart reviewed. Pt had L sided effusion also noted on 1/3 CXR -Ordered and reviewed chest CT with pt and family. Findings notable for sizable L sided effusion causing lung collapse with multiple bone mets  bony lytic lesions involving L 5th rib, L T5  transverse process and L aspect of T10 vertebral body -Pt is now s/p thoracentesis 05/13/21 yielding 700cc fluid. Fluid analysis reviewed, consistent with exudative fluid. Concern for malignant effusion. Cytology and culture pending although no organisms thus far   DVT prophylaxis: SCD's Code Status: Full Family Communication: Pt in room, family at bedside  Status is: Inpatient  Remains inpatient appropriate because: Severity of illness    Consultants:  Gastroenterology Interventional radiology General surgery Infectious diseases  Procedures:  Thoracentesis 05/13/21  Antimicrobials: Anti-infectives (From admission, onward)    Start     Dose/Rate Route Frequency Ordered Stop   04/29/21 1000  vancomycin (VANCOCIN) capsule 125 mg        125 mg Oral 4 times daily 04/29/21 0815 05/07/21 2359   04/28/21 1430  metroNIDAZOLE (FLAGYL) tablet 500 mg  Status:  Discontinued        500 mg Oral Every 8 hours 04/28/21 1358 04/29/21 0945   04/25/21 1245  vancomycin (VANCOCIN) 50 mg/mL oral solution SOLN 125 mg  Status:  Discontinued        125 mg Oral Every 6 hours 04/25/21 1145 04/29/21 0815   04/20/21 1500  vancomycin (VANCOCIN) capsule 125 mg  Status:  Discontinued        125 mg Oral 4 times daily 04/20/21 1405 04/25/21 1145       Subjective: Reports feeling better today. Chest still hurts, asking to increase fentanyl dose  Objective: Vitals:   05/13/21 1635 05/13/21 2034 05/14/21 0433 05/14/21 1356  BP: 118/60 (!) 145/71 134/64 135/74  Pulse: 74 81 67 70  Resp: 16 18 18 18   Temp: 98.9 F (37.2 C) 98 F (36.7 C) 97.9 F (36.6 C) 98.1 F (36.7 C)  TempSrc: Oral Oral Oral Oral  SpO2: 97% 96% 98% 95%  Weight:      Height:        Intake/Output Summary (Last 24 hours) at 05/14/2021 1716 Last data filed at 05/13/2021 1806 Gross per 24 hour  Intake 590 ml  Output --  Net 590 ml    Filed Weights   04/19/21 2130 05/10/21 0543  Weight: 72.1 kg 75.9 kg     Examination: General exam: Awake, laying in bed, in nad Respiratory system: Normal respiratory effort, no wheezing Cardiovascular system: regular rate, s1, s2 Gastrointestinal system: Soft, nondistended, positive BS Central nervous system: CN2-12 grossly intact, strength intact Extremities: Perfused, no clubbing Skin: Normal skin turgor, no notable skin lesions seen Psychiatry: Mood normal // no visual hallucinations   Data Reviewed: I have personally reviewed following labs and imaging studies  CBC: Recent Labs  Lab 05/09/21 0433 05/11/21 0550 05/12/21 0515 05/13/21 0501 05/14/21 0444  WBC 10.8* 13.6* 13.5* 11.3* 12.0*  HGB 11.9* 11.0* 10.8* 10.8* 10.7*  HCT 36.7 33.5* 32.9* 33.2* 33.6*  MCV 93.4 93.3 92.7 94.3 94.9  PLT 399 380 372 347 224    Basic Metabolic Panel: Recent Labs  Lab 05/08/21 0437 05/09/21 0433 05/10/21 0818 05/11/21 0550 05/12/21 0515 05/13/21 0501 05/14/21 0444  NA 135   < > 133* 135 135 134* 132*  K 3.1*   < > 3.4* 3.9 3.8 4.1 4.1  CL 106   < > 104 106 103 102 101  CO2 22   < > 22 23 24 25 25   GLUCOSE 93   < > 78 89 87 94 94  BUN 10   < > 10 13 15 14 17   CREATININE  1.03*   < > 0.94 0.87 1.00 0.94 0.94  CALCIUM 8.8*   < > 9.1 8.9 8.8* 8.8* 8.6*  MG 1.8  --   --   --   --   --   --   PHOS 4.0  --   --   --   --   --   --    < > = values in this interval not displayed.    GFR: Estimated Creatinine Clearance: 50.5 mL/min (by C-G formula based on SCr of 0.94 mg/dL). Liver Function Tests: Recent Labs  Lab 05/10/21 0818 05/11/21 0550 05/12/21 0515 05/13/21 0501 05/14/21 0444  AST 19 15 17 16 15   ALT 16 14 14 14 14   ALKPHOS 117 106 113 104 107  BILITOT 0.5 0.5 0.5 0.4 0.6  PROT 6.8 6.4* 6.2* 6.2* 6.0*  ALBUMIN 3.8 3.4* 3.3* 3.2* 3.1*    No results for input(s): LIPASE, AMYLASE in the last 168 hours. No results for input(s): AMMONIA in the last 168 hours. Coagulation Profile: No results for input(s): INR, PROTIME in the last 168  hours. Cardiac Enzymes: No results for input(s): CKTOTAL, CKMB, CKMBINDEX, TROPONINI in the last 168 hours. BNP (last 3 results) No results for input(s): PROBNP in the last 8760 hours. HbA1C: No results for input(s): HGBA1C in the last 72 hours. CBG: No results for input(s): GLUCAP in the last 168 hours. Lipid Profile: No results for input(s): CHOL, HDL, LDLCALC, TRIG, CHOLHDL, LDLDIRECT in the last 72 hours. Thyroid Function Tests: No results for input(s): TSH, T4TOTAL, FREET4, T3FREE, THYROIDAB in the last 72 hours. Anemia Panel: No results for input(s): VITAMINB12, FOLATE, FERRITIN, TIBC, IRON, RETICCTPCT in the last 72 hours. Sepsis Labs: No results for input(s): PROCALCITON, LATICACIDVEN in the last 168 hours.  Recent Results (from the past 240 hour(s))  Body fluid culture w Gram Stain     Status: None (Preliminary result)   Collection Time: 05/13/21 11:46 AM   Specimen: PATH Cytology Pleural fluid  Result Value Ref Range Status   Specimen Description   Final    PLEURAL Performed at [facility], [place] [address] [place], [place] [address]    Special Requests   Final    NONE Performed at [facility], [place] [address], [place], [place] [address]    Gram Stain   Final    FEW WBC PRESENT, PREDOMINANTLY MONONUCLEAR NO ORGANISMS SEEN    Culture   Final    NO GROWTH < 24 HOURS Performed at [facility] [address], [place], [place] [address]    Report Status PENDING  Incomplete     Radiology Studies: DG CHEST PORT 1 VIEW  Result Date: 05/13/2021 CLINICAL DATA:  Status post post thoracentesis. EXAM: PORTABLE CHEST 1 VIEW COMPARISON:  05/11/2021 FINDINGS: 1121 hours. Interval decrease in left pleural effusion, now small in size. No evidence for pneumothorax. Right lung clear. The cardio pericardial silhouette is enlarged. Bones are diffusely demineralized. IMPRESSION: Interval decrease in left pleural effusion status post  thoracentesis. No pneumothorax. Electronically Signed   By: Misty Stanley M.D.   On: 05/13/2021 11:55   US THORACENTESIS ASP PLEURAL SPACE W/IMG GUIDE  Result Date: 05/13/2021 INDICATION: Patient with history of shortness of breath found to have bilateral pleural effusions. Left greater than right. Request is for therapeutic and diagnostic thoracentesis EXAM: ULTRASOUND GUIDED DIAGNOSTIC AND THERAPEUTIC THORACENTESIS MEDICATIONS: Lidocaine 1% 10 ML COMPLICATIONS: None immediate. PROCEDURE: An ultrasound guided thoracentesis was thoroughly discussed  with the patient and questions answered. The benefits, risks, alternatives and complications were also discussed. The patient understands and wishes to proceed with the procedure. Written consent was obtained. Ultrasound was performed to localize and mark an adequate pocket of fluid in the left chest. The area was then prepped and draped in the normal sterile fashion. 1% Lidocaine was used for local anesthesia. Under ultrasound guidance a 6 Fr Safe-T-Centesis catheter was introduced. Thoracentesis was performed. The catheter was removed and a dressing applied. FINDINGS: A total of approximately 700 mL of straw-colored fluid was removed. Patient unable to tolerate additional fluid removal at this time. Samples were sent to the laboratory as requested by the clinical team. IMPRESSION: Successful ultrasound guided diagnostic and therapeutic left-sided thoracentesis yielding 700 mL of pleural fluid. Read by: Rushie Nyhan, NP Electronically Signed   By: Jacqulynn Cadet M.D.   On: 05/13/2021 11:57    Scheduled Meds:  cholestyramine light  4 g Oral Q24H   diclofenac Sodium  2 g Topical QID   diphenoxylate-atropine  1 tablet Oral BID   dronabinol  5 mg Oral BID AC   feeding supplement (KATE FARMS STANDARD 1.4)  325 mL Oral BID BM   fentaNYL  1 patch Transdermal Q72H   [START ON 05/15/2021] fentaNYL  1 patch Transdermal Q72H   FLUoxetine  20 mg Oral Daily    metoprolol tartrate  12.5 mg Oral BID   multivitamin with minerals  1 tablet Oral Daily   predniSONE  5 mg Oral Q breakfast   saccharomyces boulardii  250 mg Oral BID   Continuous Infusions:   LOS: 25 days   Marylu Lund, MD [facility] Pager On Amion  If 7PM-7AM, please contact night-coverage 05/14/2021, 5:16 PM

## 2021-05-14 NOTE — Progress Notes (Signed)
Sara Barnes   DOB:03-Jul-1940   SW#:[id number]   KXF#:[id number]  Oncology follow up note   Subjective: Sara Barnes underwent left thoracentesis yesterday, and did feel better after procedure.  She is getting little stronger, walked in the hallway today.  She still has loose bowel movement a few times a day, which is her baseline.  She is also eating better.  Sister at bedside.   Objective:  Vitals:   05/14/21 1356 05/14/21 1942  BP: 135/74 138/80  Pulse: 70 66  Resp: 18 20  Temp: 98.1 F (36.7 C) 98.1 F (36.7 C)  SpO2: 95% 97%    Body mass index is 27.01 kg/m. No intake or output data in the 24 hours ending 05/14/21 2255   Sclerae unicteric  Oropharynx clear  MSK no focal spinal tenderness, no peripheral edema  Neuro nonfocal   CBG (last 3)  No results for input(s): GLUCAP in the last 72 hours.   Labs:    Urine Studies No results for input(s): UHGB, CRYS in the last 72 hours.  Invalid input(s): UACOL, UAPR, USPG, UPH, UTP, UGL, [place], UBIL, UNIT, UROB, [place], UEPI, UWBC, Junie Panning [place], [place], [place]  Basic Metabolic Panel: Recent Labs  Lab 05/08/21 0437 05/09/21 0433 05/10/21 0818 05/11/21 0550 05/12/21 0515 05/13/21 0501 05/14/21 0444  NA 135   < > 133* 135 135 134* 132*  K 3.1*   < > 3.4* 3.9 3.8 4.1 4.1  CL 106   < > 104 106 103 102 101  CO2 22   < > 22 23 24 25 25   GLUCOSE 93   < > 78 89 87 94 94  BUN 10   < > 10 13 15 14 17   CREATININE 1.03*   < > 0.94 0.87 1.00 0.94 0.94  CALCIUM 8.8*   < > 9.1 8.9 8.8* 8.8* 8.6*  MG 1.8  --   --   --   --   --   --   PHOS 4.0  --   --   --   --   --   --    < > = values in this interval not displayed.   GFR Estimated Creatinine Clearance: 50.5 mL/min (by C-G formula based on SCr of 0.94 mg/dL). Liver Function Tests: Recent Labs  Lab 05/10/21 0818 05/11/21 0550 05/12/21 0515 05/13/21 0501 05/14/21 0444  AST 19 15 17 16 15   ALT 16 14 14 14 14   ALKPHOS 117 106 113 104 107  BILITOT 0.5 0.5 0.5 0.4 0.6  PROT 6.8  6.4* 6.2* 6.2* 6.0*  ALBUMIN 3.8 3.4* 3.3* 3.2* 3.1*   No results for input(s): LIPASE, AMYLASE in the last 168 hours. No results for input(s): AMMONIA in the last 168 hours. Coagulation profile No results for input(s): INR, PROTIME in the last 168 hours.  CBC: Recent Labs  Lab 05/09/21 0433 05/11/21 0550 05/12/21 0515 05/13/21 0501 05/14/21 0444  WBC 10.8* 13.6* 13.5* 11.3* 12.0*  HGB 11.9* 11.0* 10.8* 10.8* 10.7*  HCT 36.7 33.5* 32.9* 33.2* 33.6*  MCV 93.4 93.3 92.7 94.3 94.9  PLT 399 380 372 347 337   Cardiac Enzymes: No results for input(s): CKTOTAL, CKMB, CKMBINDEX, TROPONINI in the last 168 hours. BNP: Invalid input(s): POCBNP CBG: No results for input(s): GLUCAP in the last 168 hours. D-Dimer No results for input(s): DDIMER in the last 72 hours. Hgb A1c No results for input(s): HGBA1C in the last 72 hours. Lipid Profile No results for input(s): CHOL, HDL, LDLCALC,  TRIG, CHOLHDL, LDLDIRECT in the last 72 hours. Thyroid function studies No results for input(s): TSH, T4TOTAL, T3FREE, THYROIDAB in the last 72 hours.  Invalid input(s): FREET3 Anemia work up No results for input(s): VITAMINB12, FOLATE, FERRITIN, TIBC, IRON, RETICCTPCT in the last 72 hours. Microbiology Recent Results (from the past 240 hour(s))  Body fluid culture w Gram Stain     Status: None (Preliminary result)   Collection Time: 05/13/21 11:46 AM   Specimen: PATH Cytology Pleural fluid  Result Value Ref Range Status   Specimen Description   Final    PLEURAL Performed at [facility], [place] [address], [place], [place] [address]    Special Requests   Final    NONE Performed at [facility] [place] [address], [place], [place] [address]    Gram Stain   Final    FEW WBC PRESENT, PREDOMINANTLY MONONUCLEAR NO ORGANISMS SEEN    Culture   Final    NO GROWTH < 24 HOURS Performed at [facility] [address] [place], [place] [address]    Report  Status PENDING  Incomplete      Studies:  DG CHEST PORT 1 VIEW  Result Date: 05/13/2021 CLINICAL DATA:  Status post post thoracentesis. EXAM: PORTABLE CHEST 1 VIEW COMPARISON:  05/11/2021 FINDINGS: 1121 hours. Interval decrease in left pleural effusion, now small in size. No evidence for pneumothorax. Right lung clear. The cardio pericardial silhouette is enlarged. Bones are diffusely demineralized. IMPRESSION: Interval decrease in left pleural effusion status post thoracentesis. No pneumothorax. Electronically Signed   By: Misty Stanley M.D.   On: 05/13/2021 11:55   US THORACENTESIS ASP PLEURAL SPACE W/IMG GUIDE  Result Date: 05/13/2021 INDICATION: Patient with history of shortness of breath found to have bilateral pleural effusions. Left greater than right. Request is for therapeutic and diagnostic thoracentesis EXAM: ULTRASOUND GUIDED DIAGNOSTIC AND THERAPEUTIC THORACENTESIS MEDICATIONS: Lidocaine 1% 10 ML COMPLICATIONS: None immediate. PROCEDURE: An ultrasound guided thoracentesis was thoroughly discussed with the patient and questions answered. The benefits, risks, alternatives and complications were also discussed. The patient understands and wishes to proceed with the procedure. Written consent was obtained. Ultrasound was performed to localize and mark an adequate pocket of fluid in the left chest. The area was then prepped and draped in the normal sterile fashion. 1% Lidocaine was used for local anesthesia. Under ultrasound guidance a 6 Fr Safe-T-Centesis catheter was introduced. Thoracentesis was performed. The catheter was removed and a dressing applied. FINDINGS: A total of approximately 700 mL of straw-colored fluid was removed. Patient unable to tolerate additional fluid removal at this time. Samples were sent to the laboratory as requested by the clinical team. IMPRESSION: Successful ultrasound guided diagnostic and therapeutic left-sided thoracentesis yielding 700 mL of pleural fluid.  Read by: Rushie Nyhan, NP Electronically Signed   By: Jacqulynn Cadet M.D.   On: 05/13/2021 11:57    Assessment: 81 y.o. female   C. 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 -I again discussed the treatment options with patient, and encouraged her to  consider fulvestrant injection, which is an antiestrogen therapy.  I reviewed the potential side effects with her, especially hot flashes, slow metabolism, slightly increased risk of cardiovascular disease, hair thinning, it etc.  She previously did not tolerate letrozole, and is very reluctant to take oral medicine due to her Crohn's disease.  I have reviewed that this with her GI Dr. Hilarie Fredrickson, who agrees.  -Although CDK4/6 inhibitor such as Leslee Home would be beneficial to control her disease, it does have side effects including diarrhea.  She declined at this point. -Her sister also encouraged her to consider treatment.  After lengthy discussion, she finally agreed to start fulvestrant in the hospital tomorrow, I will place the order. -Discharge per primary care physician.  She does not needs to be observed after fulvestrant injection. -lab, flu and second dose fulvestrant injection in my office in 2 weeks.   Truitt Merle, MD 05/14/2021

## 2021-05-14 NOTE — Progress Notes (Signed)
PT Cancellation Note  Patient Details Name: Sara Barnes MRN: [id number] DOB: December 10, 1940   Cancelled Treatment:     Pt had a Thoracentesis Monday then amb with Nursing Staff in hallway Tuesday.  Pt has been evaluated with rec for [facility] PT post Acute Care Stay.  Pt has a very supportive Sister. Will continue to follow.    Rica Koyanagi  PTA Acute  Rehabilitation Services Pager      [phone] Office      [phone]

## 2021-05-15 DIAGNOSIS — J90 Pleural effusion, not elsewhere classified: Secondary | ICD-10-CM

## 2021-05-15 DIAGNOSIS — J910 Malignant pleural effusion: Secondary | ICD-10-CM

## 2021-05-15 DIAGNOSIS — E440 Moderate protein-calorie malnutrition: Secondary | ICD-10-CM

## 2021-05-15 DIAGNOSIS — Z9889 Other specified postprocedural states: Secondary | ICD-10-CM

## 2021-05-15 DIAGNOSIS — R160 Hepatomegaly, not elsewhere classified: Secondary | ICD-10-CM

## 2021-05-15 DIAGNOSIS — C50919 Malignant neoplasm of unspecified site of unspecified female breast: Secondary | ICD-10-CM

## 2021-05-15 LAB — COMPREHENSIVE METABOLIC PANEL
ALT: 14 U/L (ref 0–44)
AST: 15 U/L (ref 15–41)
Albumin: 3.3 g/dL — ABNORMAL LOW (ref 3.5–5.0)
Alkaline Phosphatase: 104 U/L (ref 38–126)
Anion gap: 6 (ref 5–15)
BUN: 18 mg/dL (ref 8–23)
CO2: 25 mmol/L (ref 22–32)
Calcium: 8.5 mg/dL — ABNORMAL LOW (ref 8.9–10.3)
Chloride: 100 mmol/L (ref 98–111)
Creatinine, Ser: 1.02 mg/dL — ABNORMAL HIGH (ref 0.44–1.00)
GFR, Estimated: 56 mL/min — ABNORMAL LOW (ref >60)
Glucose, Bld: 87 mg/dL (ref 70–99)
Potassium: 3.8 mmol/L (ref 3.5–5.1)
Sodium: 131 mmol/L — ABNORMAL LOW (ref 135–145)
Total Bilirubin: 0.5 mg/dL (ref 0.3–1.2)
Total Protein: 6.1 g/dL — ABNORMAL LOW (ref 6.5–8.1)

## 2021-05-15 LAB — CBC
HCT: 33.7 % — ABNORMAL LOW (ref 36.0–46.0)
Hemoglobin: 10.7 g/dL — ABNORMAL LOW (ref 12.0–15.0)
MCH: 29.8 pg (ref 26.0–34.0)
MCHC: 31.8 g/dL (ref 30.0–36.0)
MCV: 93.9 fL (ref 80.0–100.0)
Platelets: 339 10*3/uL (ref 150–400)
RBC: 3.59 MIL/uL — ABNORMAL LOW (ref 3.87–5.11)
RDW: 11.9 % (ref 11.5–15.5)
WBC: 11.2 10*3/uL — ABNORMAL HIGH (ref 4.0–10.5)
nRBC: 0 % (ref 0.0–0.2)

## 2021-05-15 NOTE — Progress Notes (Signed)
°   05/15/21 1400  Mobility  Activity Ambulated with assistance in hallway  Level of Assistance Standby assist, set-up cues, supervision of patient - no hands on  Assistive Device Front wheel walker  Distance Ambulated (ft) 200 ft  Activity Response Tolerated well  $Mobility charge 1 Mobility   Pt ambulated in hall with RW about 220f, tolerated well. Sister present during session. Pt left in chair with call bell at side.   KSaddle[place]ist Acute Rehab Services Office: [phone]

## 2021-05-15 NOTE — Progress Notes (Signed)
PROGRESS NOTE    Sara Barnes  IRS:[id number] DOB: October 14, 1940 DOA: 04/19/2021 PCP: Ann Held, DO    Chief Complaint  Patient presents with   Emesis   Diarrhea   Dysuria   Back Pain    Brief Narrative:  81 year old female with PMH significant for Crohn's disease, depression, insomnia, metastatic breast cancer, with mets to chest wall that has been excised, dyspepsia admitted with C. difficile colitis with complaints of nausea, vomiting and diarrhea.  She s/p biopsy of liver.  Post biopsy patient developed liver hematoma being followed by IR. Now H/H stable     Assessment & Plan:   Principal Problem:   Intractable nausea and vomiting Active Problems:   Depression   Diarrhea   Crohn disease (HCC)   Hyponatremia   Clostridium difficile colitis   Malnutrition of moderate degree  #1 C. difficile colitis/diarrhea/Crohn's disease -Improving clinically. -Patient noted to be diagnosed with C. difficile colitis and completed a 14-day course of oral vancomycin. -Consistency of stools improving. -Per Dr.Chiu who discussed with ID do not feel addition of Flagyl or Dificid would be of any help. -Patient with history of Crohn's disease noted to have had a bloody bowel movement which is improving. -Patient seen in consultation by gastroenterology who recommended stopping Lomotil and cholestyramine. -Lomotil decreased to 1 tablet twice daily, cholestyramine dose decreased. -Oral intake. -We will need outpatient follow-up with GI.  2.  Status post liver biopsy>>>> hematoma -CT abdomen and pelvis with hematoma status post liver biopsy. -General surgery consulted, H&H stable. -GI, general surgery signed off. -Supportive care.  3.  Intractable nausea -Improved on current antiemetic.  4.  History of metastatic breast cancer -Patient noted to have mets to the chest wall which has been excised. -CT abdomen and pelvis showed new hepatic lesions suspicious for metastatic  cancer. -Patient is status post liver biopsy 04/24/2021 with biopsy findings consistent with metastatic disease. -CT chest 1/14 demonstrate left-sided effusion with multiple metastatic bone lesions as well. -Status postthoracentesis yielding exudative fluid likely a malignant effusion. -Oncology following and patient to receive chemotherapy today 05/15/2021. -Outpatient follow-up with oncology.  5.  Hypokalemia -Repleted.  Potassium at 3.8.  6. acute kidney injury -Resolved with hydration. -Renal ultrasound negative for hydronephrosis. -Outpatient follow-up.  7.  Acute blood loss anemia -Secondary to liver laceration since liver biopsy. -Status post transfusion 1 unit packed red blood cells with hemoglobin stable. -Prophylactic anticoagulation discontinued.  8.  Paroxysmal atrial fibrillation -Likely secondary to ongoing diarrhea, electrolyte abnormalities, anemia. -Patient noted to have 2 episodes of transient A. fib during the hospitalization which is since resolved. -Continue metoprolol as needed rate control. -Not a candidate for anticoagulation due to intra-abdominal hematoma. -2D echo with normal EF.  TSH within normal limits. -Outpatient follow-up.  9.  Intermittent chest pain -Patient noted to have some complaints of intermittent chest pain, abdominal discomfort cramping with episodes. -EKG unremarkable.  Troponin 29>>> 21. -2D echo with normal EF, no wall motion abnormalities. -CT chest with metastatic bone lesions corresponding to location of patient's chest pain. -Patient started on fentanyl patch.  10.  Moderate protein calorie malnutrition -It is noted the patient reported improved appetite with addition of Marinol. -Nutritional supplementation.  11.  Left-sided pleural effusion, exudative -Patient noted on ambulation trial to have increased dyspnea while maintaining O2 sats. -Chest x-ray done showed moderate left-sided effusion with atelectasis. -CT findings  noted.  Small left effusion causing lung collapse and multiple bone mets, bony lytic lesions involving L  fifth rib, L T5 transverse process, left aspect of T10 vertebral body. -Status post ultrasound-guided thoracentesis 05/13/2021 leading 700 cc of fluid consistent with exudative fluid.  Concern for malignant effusion. -Oncology following.   DVT prophylaxis: SCDs. Code Status: Full Family Communication: Updated patient.  No family at bedside. Disposition:   Status is: Inpatient  Remains inpatient appropriate because: Severity of illness       Consultants:  General surgery: Dr. Marlou Starks III 04/28/2021 Interventional radiology: Dr. Earleen Newport 04/24/2021 Gastroenterology: Dr. Ardis Hughs 04/20/2021 Oncology: Dr. Burr Medico  Procedures:  Thoracentesis 05/13/2021--700 cc of pleural fluid removed per IR, Dr. Laurence Ferrari CT abdomen and pelvis 04/19/2021, 04/27/2021, 04/30/2021 CT chest with contrast 05/11/2021 Renal ultrasound 04/26/2021 Ultrasound-guided biopsy of left liver mass by IR, Dr. Earleen Newport 04/24/2021 2D echo 04/27/2021 Transfusion 1 unit packed red blood cells 04/28/2021      Antimicrobials:  Anti-infectives (From admission, onward)    Start     Dose/Rate Route Frequency Ordered Stop   04/29/21 1000  vancomycin (VANCOCIN) capsule 125 mg        125 mg Oral 4 times daily 04/29/21 0815 05/07/21 2359   04/28/21 1430  metroNIDAZOLE (FLAGYL) tablet 500 mg  Status:  Discontinued        500 mg Oral Every 8 hours 04/28/21 1358 04/29/21 0945   04/25/21 1245  vancomycin (VANCOCIN) 50 mg/mL oral solution SOLN 125 mg  Status:  Discontinued        125 mg Oral Every 6 hours 04/25/21 1145 04/29/21 0815   04/20/21 1500  vancomycin (VANCOCIN) capsule 125 mg  Status:  Discontinued        125 mg Oral 4 times daily 04/20/21 1405 04/25/21 1145         Subjective: Patient had bowel movement.  States stools are more mushy and not watery anymore.  No chest pain.  Feels shortness of breath has improved over the  past 2 days.  Sister at bedside.  States would like to see how she tolerates chemo drug over the next 24 hours.  Objective: Vitals:   05/14/21 0433 05/14/21 1356 05/14/21 1942 05/15/21 0405  BP: 134/64 135/74 138/80 120/68  Pulse: 67 70 66 62  Resp: 18 18 20 18   Temp: 97.9 F (36.6 C) 98.1 F (36.7 C) 98.1 F (36.7 C) 97.7 F (36.5 C)  TempSrc: Oral Oral Oral Oral  SpO2: 98% 95% 97% 97%  Weight:      Height:       No intake or output data in the 24 hours ending 05/15/21 [place]   04/19/21 2130 05/10/21 0543  Weight: 72.1 kg 75.9 kg    Examination:  General exam: Appears calm and comfortable  Respiratory system: Clear to auscultation. Respiratory effort normal. Cardiovascular system: S1 & S2 heard, RRR. No JVD, murmurs, rubs, gallops or clicks. No pedal edema. Gastrointestinal system: Abdomen is nondistended, soft and nontender. No organomegaly or masses felt. Normal bowel sounds heard. Central nervous system: Alert and oriented. No focal neurological deficits. Extremities: Symmetric 5 x 5 power. Skin: No rashes, lesions or ulcers Psychiatry: Judgement and insight appear normal. Mood & affect appropriate.     Data Reviewed: I have personally reviewed following labs and imaging studies  CBC: Recent Labs  Lab 05/11/21 0550 05/12/21 0515 05/13/21 0501 05/14/21 0444 05/15/21 0442  WBC 13.6* 13.5* 11.3* 12.0* 11.2*  HGB 11.0* 10.8* 10.8* 10.7* 10.7*  HCT 33.5* 32.9* 33.2* 33.6* 33.7*  MCV 93.3 92.7 94.3 94.9 93.9  PLT 380 372 347  337 476    Basic Metabolic Panel: Recent Labs  Lab 05/11/21 0550 05/12/21 0515 05/13/21 0501 05/14/21 0444 05/15/21 0442  NA 135 135 134* 132* 131*  K 3.9 3.8 4.1 4.1 3.8  CL 106 103 102 101 100  CO2 23 24 25 25 25   GLUCOSE 89 87 94 94 87  BUN 13 15 14 17 18   CREATININE 0.87 1.00 0.94 0.94 1.02*  CALCIUM 8.9 8.8* 8.8* 8.6* 8.5*    GFR: Estimated Creatinine Clearance: 46.5 mL/min (A) (by C-G formula based on SCr of  1.02 mg/dL (H)).  Liver Function Tests: Recent Labs  Lab 05/11/21 0550 05/12/21 0515 05/13/21 0501 05/14/21 0444 05/15/21 0442  AST 15 17 16 15 15   ALT 14 14 14 14 14   ALKPHOS 106 113 104 107 104  BILITOT 0.5 0.5 0.4 0.6 0.5  PROT 6.4* 6.2* 6.2* 6.0* 6.1*  ALBUMIN 3.4* 3.3* 3.2* 3.1* 3.3*    CBG: No results for input(s): GLUCAP in the last 168 hours.   Recent Results (from the past 240 hour(s))  Body fluid culture w Gram Stain     Status: None (Preliminary result)   Collection Time: 05/13/21 11:46 AM   Specimen: PATH Cytology Pleural fluid  Result Value Ref Range Status   Specimen Description   Final    PLEURAL Performed at [facility], [place] [address], [place], [place] [address]    Special Requests   Final    NONE Performed at [facility], [place] [address], [place], [place] [address]    Gram Stain   Final    FEW WBC PRESENT, PREDOMINANTLY MONONUCLEAR NO ORGANISMS SEEN    Culture   Final    NO GROWTH < 24 HOURS Performed at [facility] [address] [place],  [address]    Report Status PENDING  Incomplete         Radiology Studies: DG CHEST PORT 1 VIEW  Result Date: 05/13/2021 CLINICAL DATA:  Status post post thoracentesis. EXAM: PORTABLE CHEST 1 VIEW COMPARISON:  05/11/2021 FINDINGS: 1121 hours. Interval decrease in left pleural effusion, now small in size. No evidence for pneumothorax. Right lung clear. The cardio pericardial silhouette is enlarged. Bones are diffusely demineralized. IMPRESSION: Interval decrease in left pleural effusion status post thoracentesis. No pneumothorax. Electronically Signed   By: Misty Stanley M.D.   On: 05/13/2021 11:55   US THORACENTESIS ASP PLEURAL SPACE W/IMG GUIDE  Result Date: 05/13/2021 INDICATION: Patient with history of shortness of breath found to have bilateral pleural effusions. Left greater than right. Request is for therapeutic and diagnostic thoracentesis EXAM:  ULTRASOUND GUIDED DIAGNOSTIC AND THERAPEUTIC THORACENTESIS MEDICATIONS: Lidocaine 1% 10 ML COMPLICATIONS: None immediate. PROCEDURE: An ultrasound guided thoracentesis was thoroughly discussed with the patient and questions answered. The benefits, risks, alternatives and complications were also discussed. The patient understands and wishes to proceed with the procedure. Written consent was obtained. Ultrasound was performed to localize and mark an adequate pocket of fluid in the left chest. The area was then prepped and draped in the normal sterile fashion. 1% Lidocaine was used for local anesthesia. Under ultrasound guidance a 6 Fr Safe-T-Centesis catheter was introduced. Thoracentesis was performed. The catheter was removed and a dressing applied. FINDINGS: A total of approximately 700 mL of straw-colored fluid was removed. Patient unable to tolerate additional fluid removal at this time. Samples were sent to the laboratory as requested by the clinical team. IMPRESSION: Successful ultrasound guided diagnostic and therapeutic left-sided  thoracentesis yielding 700 mL of pleural fluid. Read by: Rushie Nyhan, NP Electronically Signed   By: Jacqulynn Cadet M.D.   On: 05/13/2021 11:57        Scheduled Meds:  cholestyramine light  4 g Oral Q24H   diclofenac Sodium  2 g Topical QID   diphenoxylate-atropine  1 tablet Oral BID   dronabinol  5 mg Oral BID AC   feeding supplement (K[employer] STANDARD 1.4)  325 mL Oral BID BM   fentaNYL  1 patch Transdermal Q72H   FLUoxetine  20 mg Oral Daily   fulvestrant  500 mg Intramuscular Once   metoprolol tartrate  12.5 mg Oral BID   multivitamin with minerals  1 tablet Oral Daily   predniSONE  5 mg Oral Q breakfast   saccharomyces boulardii  250 mg Oral BID   Continuous Infusions:   LOS: 26 days    Time spent: 35 minutes    Irine Seal, MD [facility]   To contact the attending provider between 7A-7P or the covering provider during  after hours 7P-7A, please log into the web site [email or web address] and access using universal [place] password for that web site. If you do not have the password, please call the hospital operator.  05/15/2021, 10:50 AM

## 2021-05-15 NOTE — Plan of Care (Signed)

## 2021-05-15 NOTE — Progress Notes (Signed)
Chaplain provided support to Chelsea and her sister.  Merion had just received a treatment.  She noted that it wasn't painful and that she is doing well.  Akyla and her sister spent some time talking about their faith, Niyah's healthcare plan, their beloved cats, and going home.  Faryn has been praying and asking God to walk with her through this journey.    Chaplain provided listening, support, and presence.    05/15/21 1300  Clinical Encounter Type  Visited With Patient and family together  Visit Type Follow-up;Spiritual support  Stress Factors  Patient Stress Factors Major life changes

## 2021-05-16 LAB — BASIC METABOLIC PANEL
Anion gap: 8 (ref 5–15)
BUN: 13 mg/dL (ref 8–23)
CO2: 25 mmol/L (ref 22–32)
Calcium: 8.8 mg/dL — ABNORMAL LOW (ref 8.9–10.3)
Chloride: 101 mmol/L (ref 98–111)
Creatinine, Ser: 0.88 mg/dL (ref 0.44–1.00)
GFR, Estimated: >60 mL/min (ref >60)
Glucose, Bld: 86 mg/dL (ref 70–99)
Potassium: 3.9 mmol/L (ref 3.5–5.1)
Sodium: 134 mmol/L — ABNORMAL LOW (ref 135–145)

## 2021-05-16 LAB — CBC
HCT: 34.2 % — ABNORMAL LOW (ref 36.0–46.0)
Hemoglobin: 11.4 g/dL — ABNORMAL LOW (ref 12.0–15.0)
MCH: 30.9 pg (ref 26.0–34.0)
MCHC: 33.3 g/dL (ref 30.0–36.0)
MCV: 92.7 fL (ref 80.0–100.0)
Platelets: 341 10*3/uL (ref 150–400)
RBC: 3.69 MIL/uL — ABNORMAL LOW (ref 3.87–5.11)
RDW: 12.1 % (ref 11.5–15.5)
WBC: 11 10*3/uL — ABNORMAL HIGH (ref 4.0–10.5)
nRBC: 0 % (ref 0.0–0.2)

## 2021-05-16 LAB — BODY FLUID CULTURE W GRAM STAIN: Culture: NO GROWTH

## 2021-05-16 LAB — MAGNESIUM: Magnesium: 2.1 mg/dL (ref 1.7–2.4)

## 2021-05-16 MED ORDER — FENTANYL 25 MCG/HR TD PT72: 1.0000 | MEDICATED_PATCH | TRANSDERMAL | 0 refills | Status: DC

## 2021-05-16 MED ORDER — CHOLESTYRAMINE LIGHT 4 G PO PACK: 4.0000 g | PACK | ORAL | 1 refills | Status: DC

## 2021-05-16 MED ORDER — KATE FARMS STANDARD 1.4 PO LIQD: 325.0000 mL | Freq: Two times a day (BID) | ORAL | Status: DC

## 2021-05-16 MED ORDER — GERHARDT'S BUTT CREAM: 1.0000 "application " | TOPICAL_CREAM | CUTANEOUS | 0 refills | Status: DC | PRN

## 2021-05-16 MED ORDER — SACCHAROMYCES BOULARDII 250 MG PO CAPS: 250.0000 mg | ORAL_CAPSULE | Freq: Two times a day (BID) | ORAL | Status: DC

## 2021-05-16 MED ORDER — HYDROMORPHONE HCL 2 MG PO TABS: 2.0000 mg | ORAL_TABLET | ORAL | 0 refills | Status: DC | PRN

## 2021-05-16 MED ORDER — DRONABINOL 5 MG PO CAPS: 5.0000 mg | ORAL_CAPSULE | Freq: Two times a day (BID) | ORAL | 0 refills | Status: DC

## 2021-05-16 MED ORDER — ADULT MULTIVITAMIN W/MINERALS CH: 1.0000 | ORAL_TABLET | Freq: Every day | ORAL | Status: DC

## 2021-05-16 MED ORDER — DICLOFENAC SODIUM 1 % EX GEL: 2.0000 g | Freq: Four times a day (QID) | CUTANEOUS | Status: DC

## 2021-05-16 MED ORDER — METOPROLOL TARTRATE 25 MG PO TABS: 12.5000 mg | ORAL_TABLET | Freq: Two times a day (BID) | ORAL | 1 refills | Status: DC

## 2021-05-16 NOTE — Discharge Summary (Signed)
Physician Discharge Summary  Sara Barnes GXQ:[id number] DOB: November 14, 1940 DOA: 04/19/2021  PCP: Ann Held, DO  Admit date: 04/19/2021 Discharge date: 05/16/2021  Time spent: 60 minutes  Recommendations for Outpatient Follow-up:  Follow-up with Ann Held, DO in 2 weeks.  On follow-up patient will need a basic metabolic profile done to follow-up on electrolytes and renal function, magnesium level checked, CBC done to follow-up on H&H. Follow-up with Dr. Hilarie Fredrickson, gastroenterology in 3 to 4 weeks. Follow-up with Dr. Burr Medico, oncology as scheduled 05/30/2021 at 1 PM. Patient with discharge home with home health therapies.   Discharge Diagnoses:  Principal Problem:   Intractable nausea and vomiting Active Problems:   Depression   Diarrhea   Crohn disease (HCC)   Hyponatremia   Clostridium difficile colitis   Malnutrition of moderate degree   Liver mass   Metastatic breast cancer (HCC)   Pleural effusion   Status post thoracentesis   Discharge Condition: Stable and improved  Diet recommendation: Regular diet.  Filed Weights   04/19/21 2130 05/10/21 0543  Weight: 72.1 kg 75.9 kg    History of present illness:  HPI per Dr. Dorris Fetch Sara Barnes is a 81 y.o. female, with PMH of insomnia, Crohn's disease, depression, dyspepsia, who presented to the ER on 04/19/2021 with intractable nausea, vomiting, diarrhea.   Patient states for the past 2 weeks or so he has been having multiple bowel movements daily, severe nausea, poor appetite and vomiting.  She has been unable to tolerate much p.o. intake without vomiting.  She feels like he is getting weaker.  She has a history of Crohn's so She has some chronic diarrhea but that this is worse.  At home she takes Imodium and Lomotil.  She is also on prednisone 5 mg daily for her Crohn's.  She has not been hospitalized oblate related to her Crohn's.  She follow-up with GI routinely.  Given her symptoms, she came to the ER for  further evaluation.       ED course: -Vitals on admission: Afebrile, heart rate 86, respiratory rate 17, blood pressure 139/88, maintaining sats on room air -Labs on initial presentation: Sodium 130, potassium 3.9, chloride 99, bicarb 22, glucose 113, BUN 10, creatinine 0.89, calcium 9, albumin 3.7, hemoglobin 15.7, WBC 8.7, COVID-negative, UA negative for UTI -Imaging obtained on admission: CT abdomen pelvis demonstrated multiple hypodense hepatic masses, however bowels appeared fine. -In the ED the patient was given Dilaudid, Ativan, IV fluids Zofran, and the hospitalist service was contacted for further evaluation and management.  Hospital Course:   #1 C. difficile colitis/diarrhea/Crohn's disease -Improving clinically. -Patient noted to be diagnosed with C. difficile colitis and completed a 14-day course of oral vancomycin. -Consistency of stools improving. -Per Dr.Chiu who discussed with ID do not feel addition of Flagyl or Dificid would be of any help. -Patient with history of Crohn's disease noted to have had a bloody bowel movement which is improving. -Patient seen in consultation by gastroenterology who recommended stopping Lomotil and cholestyramine. -Lomotil decreased to 1 tablet twice daily, cholestyramine dose decreased. -Patient's stools became more soft and patient improved clinically. -Outpatient follow-up with primary gastroenterologist, Dr. Hilarie Fredrickson in 3 to 4 weeks for further management of her Crohn's disease.  2.  Status post liver biopsy>>>> hematoma -CT abdomen and pelvis with hematoma status post liver biopsy. -General surgery consulted, H&H stable. -GI, general surgery signed off. -Supportive care.  3.  Intractable nausea -Improved on antiemetics.  4.  History of  metastatic breast cancer -Patient noted to have mets to the chest wall which has been excised. -CT abdomen and pelvis showed new hepatic lesions suspicious for metastatic cancer. -Patient is status  post liver biopsy 04/24/2021 with biopsy findings consistent with metastatic disease. -CT chest 1/14 demonstrate left-sided effusion with multiple metastatic bone lesions as well. -Status postthoracentesis yielding exudative fluid likely a malignant effusion. -Oncology following and patient to received chemotherapy on 05/15/2021 without any complications. -Outpatient follow-up with oncology.  5.  Hypokalemia -Repleted.    6. acute kidney injury -Resolved with hydration. -Renal ultrasound negative for hydronephrosis. -Outpatient follow-up.  7.  Acute blood loss anemia -Secondary to liver laceration since liver biopsy. -Status post transfusion 1 unit packed red blood cells with hemoglobin stable. -Prophylactic anticoagulation discontinued. -Hemoglobin remained stable. -Outpatient follow-up.  8.  Paroxysmal atrial fibrillation -Likely secondary to ongoing diarrhea, electrolyte abnormalities, anemia. -Patient noted to have 2 episodes of transient A. fib during the hospitalization which has since resolved. -Patient maintained on metoprolol as needed for rate control. -Not a candidate for anticoagulation due to intra-abdominal hematoma. -2D echo with normal EF.  TSH within normal limits. -Outpatient follow-up.  9.  Intermittent chest pain -Patient noted to have some complaints of intermittent chest pain, abdominal discomfort cramping with episodes. -EKG unremarkable.  Troponin 29>>> 21. -2D echo with normal EF, no wall motion abnormalities. -CT chest with metastatic bone lesions corresponding to location of patient's chest pain. -Patient started on fentanyl patch and oral Dilaudid as needed for breakthrough pain. -Patient will be prescribed prescription and will need outpatient follow-up with oncology for further pain management..  10.  Moderate protein calorie malnutrition -It is noted the patient reported improved appetite with addition of Marinol. -Nutritional supplementation  provided.  Outpatient follow-up..  11.  Left-sided pleural effusion, exudative -Patient noted on ambulation trial to have increased dyspnea while maintaining O2 sats. -Chest x-ray done showed moderate left-sided effusion with atelectasis. -CT findings noted.  Small left effusion causing lung collapse and multiple bone mets, bony lytic lesions involving L fifth rib, L T5 transverse process, left aspect of T10 vertebral body. -Status post ultrasound-guided thoracentesis 05/13/2021 with 700 cc of fluid consistent with exudative fluid.  Concern for malignant effusion. -Outpatient follow-up with oncology as scheduled   Procedures: Thoracentesis 05/13/2021--700 cc of pleural fluid removed per IR, Dr. Laurence Ferrari CT abdomen and pelvis 04/19/2021, 04/27/2021, 04/30/2021 CT chest with contrast 05/11/2021 Renal ultrasound 04/26/2021 Ultrasound-guided biopsy of left liver mass by IR, Dr. Earleen Newport 04/24/2021 2D echo 04/27/2021 Transfusion 1 unit packed red blood cells 04/28/2021  Consultations: General surgery: Dr. Marlou Starks III 04/28/2021 Interventional radiology: Dr. Earleen Newport 04/24/2021 Gastroenterology: Dr. Ardis Hughs 04/20/2021 Oncology: Dr. Burr Medico  Discharge Exam: Vitals:   05/16/21 0412 05/16/21 1211  BP: 132/63 (!) 118/103  Pulse: 70 62  Resp: 18 20  Temp: (!) 97.4 F (36.3 C) 98.3 F (36.8 C)  SpO2: 97% 96%    General: NAD Cardiovascular: RRR no murmurs rubs or gallops.  No JVD.  No lower extremity edema. Respiratory: Clear to auscultation bilaterally.  No wheezes, no crackles, no rhonchi.  Discharge Instructions   Discharge Instructions     Diet general   Complete by: As directed    Discharge wound care:   Complete by: As directed    As above   Increase activity slowly   Complete by: As directed       Allergies as of 05/16/2021       Reactions   Nsaids Other (See Comments)  Crohn's disease = NO NSAIDs   Codeine Nausea And Vomiting   Mesalamine Nausea And Vomiting   Oxycodone  Itching, Rash   Lactose Intolerance (gi) Diarrhea   Norco [hydrocodone-acetaminophen] Nausea Only   Other Itching   PERFUMED LOTIONS Cant have MRI due to ear implant    Sulfa Antibiotics Rash        Medication List     TAKE these medications    acetaminophen 500 MG tablet Commonly known as: TYLENOL You can take 2 tablets every 6 hours as needed for pain.  Use this as your primary pain control.  Use Tramadol as a last resort.  You can buy this over the counter at any drug store.    DO NOT TAKE MORE THAN 4000 MG OF TYLENOL PER DAY.  IT CAN HARM YOUR LIVER.   cholestyramine light 4 g packet Commonly known as: PREVALITE Take 1 packet (4 g total) by mouth daily. Start taking on: May 17, 2021   cyanocobalamin 1000 MCG/ML injection Commonly known as: (VITAMIN B-12) As directed What changed:  how much to take when to take this additional instructions   diclofenac Sodium 1 % Gel Commonly known as: VOLTAREN Apply 2 g topically 4 (four) times daily.   diphenoxylate-atropine 2.5-0.025 MG tablet Commonly known as: LOMOTIL Take 1-2 tablets by mouth 3-4 times daily AS NEEDED for severe diarrhea   dronabinol 5 MG capsule Commonly known as: MARINOL Take 1 capsule (5 mg total) by mouth 2 (two) times daily before lunch and supper.   famotidine 20 MG tablet Commonly known as: PEPCID Take 1 tablet (20 mg total) by mouth 2 (two) times daily. What changed:  when to take this reasons to take this   feeding supplement (KATE FARMS STANDARD 1.4) Liqd liquid Take 325 mLs by mouth 2 (two) times daily between meals. Start taking on: May 17, 2021   fentaNYL 25 MCG/HR Commonly known as: Redmon 1 patch onto the skin every 3 (three) days. Start taking on: May 18, 2021   FLUoxetine 20 MG capsule Commonly known as: PROZAC Take 1 capsule (20 mg total) by mouth daily.   fluticasone 50 MCG/ACT nasal spray Commonly known as: FLONASE Place 2 sprays into both nostrils  daily. What changed:  when to take this reasons to take this   gabapentin 100 MG capsule Commonly known as: Neurontin Take 3 capsules (300 mg total) by mouth 3 (three) times daily. What changed:  when to take this reasons to take this   Gerhardt's butt cream Crea Apply 1 application topically as needed for irritation.   HYDROmorphone 2 MG tablet Commonly known as: DILAUDID Take 1 tablet (2 mg total) by mouth every 4 (four) hours as needed for severe pain.   loperamide 2 MG capsule Commonly known as: IMODIUM Take 1 capsule (2 mg total) by mouth as needed for diarrhea or loose stools.   metoprolol tartrate 25 MG tablet Commonly known as: LOPRESSOR Take 0.5 tablets (12.5 mg total) by mouth 2 (two) times daily.   multivitamin with minerals Tabs tablet Take 1 tablet by mouth daily. Start taking on: May 17, 2021   ondansetron 4 MG disintegrating tablet Commonly known as: ZOFRAN-ODT DISSOLVE ONE TABLET BY MOUTH EVERY 6 HOURS AS NEEDED FOR NAUSEA AND VOMITING   predniSONE 5 MG tablet Commonly known as: DELTASONE TAKE 1 TABLET(5 MG) BY MOUTH DAILY   saccharomyces boulardii 250 MG capsule Commonly known as: FLORASTOR Take 1 capsule (250 mg total) by mouth 2 (two)  times daily.   zolpidem 10 MG tablet Commonly known as: AMBIEN Take 1 tablet (10 mg total) by mouth at bedtime as needed for sleep.               Discharge Care Instructions  (From admission, onward)           Start     Ordered   05/16/21 0000  Discharge wound care:       Comments: As above   05/16/21 1445           Allergies  Allergen Reactions   Nsaids Other (See Comments)    Crohn's disease = NO NSAIDs   Codeine Nausea And Vomiting   Mesalamine Nausea And Vomiting   Oxycodone Itching and Rash   Lactose Intolerance (Gi) Diarrhea   Norco [Hydrocodone-Acetaminophen] Nausea Only   Other Itching    PERFUMED LOTIONS  Cant have MRI due to ear implant    Sulfa Antibiotics Rash     Follow-up Information     [facility] [place] Follow up.   Specialty: [place] Why: [facility] physical therapy Contact information: [address] [place] [address] [phone]         Ann Held, DO. Schedule an appointment as soon as possible for a visit in 2 week(s).   Specialty: Family Medicine Contact information: [address] [place] [address] [phone]         Truitt Merle, MD Follow up on 05/30/2021.   Specialties: Hematology, Oncology Why: f/u at 1pm as scheduled Contact information: [place] [address] [phone]         Jerene Bears, MD. Schedule an appointment as soon as possible for a visit in 3 week(s).   Specialty: Gastroenterology Why: f/u in 3-4 weeks. Contact information: [address] [place] [address] [phone]                  The results of significant diagnostics from this hospitalization (including imaging, microbiology, ancillary and laboratory) are listed below for reference.    Significant Diagnostic Studies: CT ABDOMEN PELVIS WO CONTRAST  Result Date: 04/30/2021 CLINICAL DATA:  Nausea, vomiting. EXAM: CT ABDOMEN AND PELVIS WITHOUT CONTRAST TECHNIQUE: Multidetector CT imaging of the abdomen and pelvis was performed following the standard protocol without IV contrast. COMPARISON:  April 27, 2021. FINDINGS: Lower chest: Bilateral pleural effusions are noted with adjacent bibasilar atelectasis. Hepatobiliary: No gallstones or biliary dilatation is noted. 10.7 x 7.5 cm hemorrhage is again noted involving the posterior and inferior aspect of the left hepatic lobe which appears to be enlarged compared to prior exam. Pancreas: Unremarkable. No pancreatic ductal dilatation or surrounding inflammatory changes. Spleen: Normal in size without focal abnormality. Adrenals/Urinary Tract: Adrenal glands are unremarkable. Kidneys are normal, without  renal calculi, focal lesion, or hydronephrosis. Bladder is unremarkable. Stomach/Bowel: Stomach appears normal. There is no evidence of bowel obstruction or inflammation. Vascular/Lymphatic: Aortic atherosclerosis. No enlarged abdominal or pelvic lymph nodes. Reproductive: Status post hysterectomy. No adnexal masses. Other: Decreased amount of pelvic hemorrhage is noted compared to prior exam. No hernia is noted. Musculoskeletal: No acute or significant osseous findings. IMPRESSION: Large hemorrhage is seen involving left hepatic lobe which appears to be slightly enlarged compared to prior exam, most likely due to hepatic laceration. There is a slightly decreased amount of hemoperitoneum seen in the pelvis compared to prior exam. Bilateral pleural effusions are again noted with adjacent bibasilar  atelectasis. Electronically Signed   By: Marijo Conception M.D.   On: 04/30/2021 17:48   CT ABDOMEN PELVIS WO CONTRAST  Result Date: 04/27/2021 CLINICAL DATA:  Postop.  Abdominal pain.  Nausea vomiting. EXAM: CT ABDOMEN AND PELVIS WITHOUT CONTRAST TECHNIQUE: Multidetector CT imaging of the abdomen and pelvis was performed following the standard protocol without IV contrast. COMPARISON:  04/19/2021. FINDINGS: Lower chest: Moderate left and small to moderate right pleural effusions. Left greater than right lower lobe opacities consistent with atelectasis. Scattered linear and reticular opacities elsewhere at the lung bases, also likely atelectasis. Pleural effusions have increased in size from the prior CT. Hepatobiliary: Lateral segment of the left lobe appears larger than on the recent prior exam, particularly along its inferior margin. There is a vague area of relative hypoattenuation across the inferior aspect, segment 3. Remainder of the liver is unremarkable. Normal gallbladder. No bile duct dilation. Pancreas: Unremarkable. No pancreatic ductal dilatation or surrounding inflammatory changes. Spleen: Normal in size  without focal abnormality. Adrenals/Urinary Tract: Adrenal glands are unremarkable. Kidneys are normal, without renal calculi, focal lesion, or hydronephrosis. Bladder is unremarkable. Stomach/Bowel: Minimal hiatal hernia. Stomach mostly decompressed. There is haziness increased attenuation along the wall of the gastric antrum. Small bowel and colon are normal in caliber. No wall thickening or inflammation. Vascular/Lymphatic: Aortic atherosclerosis. No aneurysm. No enlarged lymph nodes. Reproductive: Status post hysterectomy. No adnexal masses. Other: Small amount of hemoperitoneum primarily collecting in the pelvis.a additional small areas of hyperattenuation lie underneath the left hemidiaphragm suspected to be additional hemorrhage, abutting the lateral segment the left liver lobe. Musculoskeletal: No fracture or acute finding. IMPRESSION: 1. Suspected hemorrhage from the lateral segment of the left liver lobe. Lateral segment now larger than it was on the previous CT with an area of hypoattenuation in segment 3, which may reflect a contusion/laceration. The round lesion noted in segment 2 on the prior CT is not defined. There is apparent hemorrhage adjacent to the lateral segment, underneath the left hemidiaphragm and adjacent to the stomach. Definitive hemoperitoneum, small in amount, is seen in the pelvis. 2. No other acute abnormality within the abdomen or pelvis. 3. Moderate left and small right pleural effusion with associated lung base dependent opacity consistent with atelectasis. Pleural effusions have increased in size from prior CT. Electronically Signed   By: Lajean Manes M.D.   On: 04/27/2021 15:56   DG Chest 1 View  Result Date: 04/29/2021 CLINICAL DATA:  chest discomfort and shortness of breath EXAM: CHEST  1 VIEW COMPARISON:  Chest x-ray 04/26/2021, CT chest 04/15/2012 FINDINGS: The heart and mediastinal contours are unchanged. Aortic calcification. Prominent hilar vasculature. Biapical  pleural/pulmonary scarring. Hazy airspace opacities of bilateral mid to lower lung zones. Slightly increased interstitial markings. At least small to moderate volume left and trace to small volume right pleural effusions. No pneumothorax. No acute osseous abnormality. IMPRESSION: 1. Hazy airspace opacities of bilateral mid to lower lung zones. At least small to moderate volume left and trace to small volume right pleural effusions. 2. Pulmonary venous congestion. 3.  Aortic Atherosclerosis (ICD10-I70.0). Electronically Signed   By: Iven Finn M.D.   On: 04/29/2021 16:37   DG Chest 1 View  Result Date: 04/26/2021 CLINICAL DATA:  Shortness of breath. EXAM: CHEST  1 VIEW COMPARISON:  Chest x-ray 03/24/2021 FINDINGS: Moderate size left pleural effusion is unchanged. There is a new small right pleural effusion. There are minimal bibasilar opacities. There is no pneumothorax. Cardiomediastinal silhouette is unchanged, the heart  is mildly enlarged. Surgical clips are seen along the left chest wall. No acute fractures are identified. IMPRESSION: 1. New small right pleural effusion. 2. Stable moderate left pleural effusion. 3. Minimal bibasilar atelectasis/airspace disease. Electronically Signed   By: Ronney Asters M.D.   On: 04/26/2021 15:45   DG Chest 1 View  Result Date: 04/22/2021 CLINICAL DATA:  81 year old female with shortness of breath. EXAM: CHEST  1 VIEW COMPARISON:  Chest radiograph 10/30/2016 and earlier. CT Abdomen and Pelvis 04/19/2021. FINDINGS: Portable AP semi upright view at 0607 hours. Some artifact due to bilateral breast implants, but also veiling opacity at the left lung base in keeping with ongoing left pleural effusion demonstrated recently on CT Abdomen and Pelvis. No superimposed pneumothorax or pulmonary edema. No air bronchograms. Allowing for portable technique mediastinal contours appear stable since 2018. Visualized tracheal air column is within normal limits. Chronic left chest  wall surgical clips. No acute osseous abnormality identified. IMPRESSION: 1. Ongoing small to moderate left pleural effusion seen recently on CT Abdomen and Pelvis. 2. Artifact from breast implants. No other acute cardiopulmonary abnormality. Electronically Signed   By: Genevie Ann M.D.   On: 04/22/2021 06:51   CT CHEST W CONTRAST  Result Date: 05/11/2021 CLINICAL DATA:  Pleural effusion, liver lesions, malignancy suspected EXAM: CT CHEST WITH CONTRAST TECHNIQUE: Multidetector CT imaging of the chest was performed during intravenous contrast administration. RADIATION DOSE REDUCTION: This exam was performed according to the departmental dose-optimization program which includes automated exposure control, adjustment of the mA and/or kV according to patient size and/or use of iterative reconstruction technique. CONTRAST:  9m OMNIPAQUE IOHEXOL 300 MG/ML  SOLN COMPARISON:  04/15/2012, 04/30/2021, 05/11/2021 FINDINGS: Cardiovascular: The heart and great vessels are unremarkable without pericardial effusion. No evidence of thoracic aortic aneurysm or dissection. Mediastinum/Nodes: Thyroid, trachea, and esophagus are grossly unremarkable. Right hilar adenopathy measuring up to 13 x 20 mm, reference image 60/2. Subcentimeter left hilar and subcarinal lymph nodes are also noted. Lungs/Pleura: There are bilateral pleural effusions, left greater than right. Compressive atelectasis within the lower lobes, left greater than right. Scattered ground-glass airspace disease within the right upper lobe may be inflammatory or infectious. No pneumothorax. The central airways are patent. Upper Abdomen: The hypodensity within the left lobe liver has become more well-defined, measuring approximately 9.2 x 6.7 cm, consistent with resolving subcapsular hepatic hematoma. Stable hypodense lesions more superiorly within the left lobe liver image 116/2 and inferolateral right lobe liver image 146/2, concerning for metastatic disease. Please  refer to previous biopsy results. No other acute upper abdominal findings. Musculoskeletal: Destructive lytic lesions are seen within the left posterior fifth rib and left T5 transverse process, as well as within the left aspect of the T10 vertebral body, consistent with bony metastases. No other acute bony abnormalities. No acute fractures. Reconstructed images demonstrate no additional findings. IMPRESSION: 1. Bilateral pleural effusions and lower lobe compressive atelectasis, left greater than right. 2. Nonspecific right hilar lymphadenopathy. 3. Bony lytic lesions involving the left posterior fifth rib, left T5 transverse process, and left aspect of the T10 vertebral body, consistent with bony metastases. 4. Patchy right upper lobe ground-glass airspace disease, which may be inflammatory or infectious. 5. Slight decrease in size of the left lobe subcapsular liver hematoma. No evidence of active hemorrhage. 6. Stable liver hypodensities consistent with presumed metastatic disease. Please correlate with previous biopsy results. Electronically Signed   By: MRanda NgoM.D.   On: 05/11/2021 21:20   CT ABDOMEN PELVIS W CONTRAST  Result Date: 04/19/2021 CLINICAL DATA:  Nausea, vomiting, abdominal pain, acute nonlocalized. History of left breast with lumpectomy and radiation. EXAM: CT ABDOMEN AND PELVIS WITH CONTRAST TECHNIQUE: Multidetector CT imaging of the abdomen and pelvis was performed using the standard protocol following bolus administration of intravenous contrast. CONTRAST:  13m OMNIPAQUE IOHEXOL 350 MG/ML SOLN COMPARISON:  CT examination dated November 11, 2019 FINDINGS: Lower chest: Moderate left and small right pleural effusion with left basilar atelectasis. Small hiatal hernia. Hepatobiliary: At least 3 hyperdense masses in the liver. In the left hepatic lobe measuring 2.2 x 2.2 cm (series 2 image 9). Another hypodense structure abutting the diaphragm measuring 1.4 x 1.6 cm (series 2, image 8).  Another hyperdense structure in the inferior aspect of the right hepatic lobe (series 2, image 20). These masses are highly suspicious for metastatic disease until proven otherwise. Gallbladder is unremarkable. No biliary ductal dilatation. Pancreas: Unremarkable. No pancreatic ductal dilatation or surrounding inflammatory changes. Spleen: Normal in size without focal abnormality. Adrenals/Urinary Tract: Adrenal glands are unremarkable. Kidneys are normal, without renal calculi, focal lesion, or hydronephrosis. Bladder is unremarkable. Stomach/Bowel: Stomach is within normal limits. Small bowel loops are normal in caliber. Postsurgical changes for prior hemicolectomy. No evidence of bowel wall thickening, distention, or inflammatory changes. Vascular/Lymphatic: Aortic atherosclerosis. No enlarged abdominal or pelvic lymph nodes. Reproductive: Status post hysterectomy. No adnexal masses. Other: No abdominal wall hernia or abnormality. No abdominopelvic ascites. Musculoskeletal: Mild multilevel degenerative disc disease. No acute osseous abnormality. IMPRESSION: 1. Multiple hypodense hepatic masses, as detailed above, highly suspicious for metastatic disease. Differential include infectious/inflammatory process. Further evaluation with MRI examination with contrast would be helpful. 2. Moderate left and small right pleural effusion. 3. Postsurgical changes for prior hemicolectomy. No bowel obstruction, colitis or diverticulitis. Electronically Signed   By: IKeane PoliceD.O.   On: 04/19/2021 16:13   [place]  Result Date: 04/26/2021 CLINICAL DATA:  Elevated BUN and creatinine.  Short of breath. EXAM: RENAL / URINARY TRACT ULTRASOUND COMPLETE COMPARISON:  CT, 04/19/2021. FINDINGS: Right Kidney: Renal measurements: 11 x 1 x 4.2 x 5.3 cm = volume: 128.7 mL. Echogenicity within normal limits. No mass or hydronephrosis visualized. Left Kidney: Renal measurements: 11.0 x 4.5 x 4.8 cm = volume: 124 mL. Echogenicity  within normal limits. No mass or hydronephrosis visualized. Bladder: Unremarkable. Hypo to anechoic areas adjacent to the bladder matter nonspecific, possibly small amounts of peritoneal fluid versus fluid-filled bowel. Other: None. IMPRESSION: 1. Normal sonographic appearance of the kidneys. Electronically Signed   By: DLajean ManesM.D.   On: 04/26/2021 16:35   [place]BIOPSY (LIVER)  Result Date: 04/24/2021 INDICATION: 81year old female with a history multiple liver lesions referred for biopsy EXAM: ULTRASOUND-GUIDED BIOPSY LIVER MASS MEDICATIONS: None. ANESTHESIA/SEDATION: Moderate (conscious) sedation was employed during this procedure. A total of Versed 1.5 mg and Fentanyl 100 mcg was administered intravenously by the radiology nurse. Total intra-service moderate Sedation Time: 10 minutes. The patient's level of consciousness and vital signs were monitored continuously by radiology nursing throughout the procedure under my direct supervision. FLUOROSCOPY TIME:  Ultrasound none COMPLICATIONS: None PROCEDURE: Informed written consent was obtained from the patient after a thorough discussion of the procedural risks, benefits and alternatives. All questions were addressed. Maximal Sterile Barrier Technique was utilized including caps, mask, sterile gowns, sterile gloves, sterile drape, hand hygiene and skin antiseptic. A timeout was performed prior to the initiation of the procedure. Ultrasound survey of the right liver lobe performed with images stored and sent to  PACs. The subxiphoid region was prepped with chlorhexidine in a sterile fashion, and a sterile drape was applied covering the operative field. A sterile gown and sterile gloves were used for the procedure. Local anesthesia was provided with 1% Lidocaine. The patient was prepped and draped sterilely and the skin and subcutaneous tissues were generously infiltrated with 1% lidocaine. A 17 gauge introducer needle was then advanced under ultrasound  guidance in an intercostal location into the left liver lobe, targeting left liver mass. The stylet was removed, and multiple separate 18 gauge core biopsy were retrieved. Samples were placed into formalin for transportation to the lab. Gel-Foam pledgets were then infused with a small amount of saline for assistance with hemostasis. The needle was removed, and a final ultrasound image was performed. The patient tolerated the procedure well and remained hemodynamically stable throughout. No complications were encountered and no significant blood loss was encounter. IMPRESSION: Status post ultrasound-guided biopsy of left liver mass Signed, Dulcy Fanny. Dellia Nims, RPVI [facility] Electronically Signed   By: Corrie Mckusick D.O.   On: 04/24/2021 14:43   DG CHEST PORT 1 VIEW  Result Date: 05/13/2021 CLINICAL DATA:  Status post post thoracentesis. EXAM: PORTABLE CHEST 1 VIEW COMPARISON:  05/11/2021 FINDINGS: 1121 hours. Interval decrease in left pleural effusion, now small in size. No evidence for pneumothorax. Right lung clear. The cardio pericardial silhouette is enlarged. Bones are diffusely demineralized. IMPRESSION: Interval decrease in left pleural effusion status post thoracentesis. No pneumothorax. Electronically Signed   By: Misty Stanley M.D.   On: 05/13/2021 11:55   DG CHEST PORT 1 VIEW  Result Date: 05/11/2021 CLINICAL DATA:  Shortness of breath EXAM: PORTABLE CHEST 1 VIEW COMPARISON:  04/29/2021 FINDINGS: Transverse diameter of heart is increased. There are no signs of alveolar pulmonary edema. There is opacification of left mid and left lower lung fields, possibly suggesting moderate left pleural effusion. Possibility of underlying atelectasis/pneumonia is not excluded. There is improvement in aeration of right lower lung fields. There is minimal blunting of right lateral CP angle. There is no pneumothorax. Surgical clips are seen in the left  chest wall. IMPRESSION: There is diffuse haziness in the left mid and left lower lung fields suggesting moderate left pleural effusion and possibly underlying atelectasis/pneumonia. There is interval improvement in aeration in the right lower lung fields. There is possible minimal right pleural effusion. Electronically Signed   By: Elmer Picker M.D.   On: 05/11/2021 15:48   DG CHEST PORT 1 VIEW  Result Date: 04/23/2021 CLINICAL DATA:  Shortness of breath EXAM: PORTABLE CHEST 1 VIEW COMPARISON:  04/22/2021 FINDINGS: Transverse diameter of heart is increased. There is diffuse haziness in the left mid and left lower lung fields. This may be due to moderate left pleural effusion. Evaluation of left mid and left lower lung fields for infiltrates is limited by the effusion. Right lung is clear. Right lateral CP angle is clear. There is no pneumothorax. IMPRESSION: No significant interval changes are noted in the homogeneous opacity in the left mid and left lower lung fields suggesting moderate left pleural effusion. Possibility of underlying atelectasis/pneumonitis in the left mid and left lower lung fields is not excluded. Electronically Signed   By: Elmer Picker M.D.   On: 04/23/2021 10:03   ECHOCARDIOGRAM COMPLETE  Result Date: 04/27/2021    ECHOCARDIOGRAM REPORT   Patient Name:   SHANEY DECKMAN Date of Exam: 04/27/2021 Medical Rec #:  [id number]  Height:       66.0 in Accession #:    [id number]     Weight:       159.0 lb Date of Birth:  17-Mar-1941       BSA:          1.814 m Patient Age:    32 years       BP:           121/64 mmHg Patient Gender: F              HR:           87 bpm. Exam Location:  Inpatient Procedure: 2D Echo, Cardiac Doppler and Color Doppler Indications:    Abnormal ECG R94.31  History:        Patient has prior history of Echocardiogram examinations, most                 recent 06/22/2016. Risk Factors:Dyslipidemia. GERD. Past history                 of breast cancer.   Sonographer:    Johny Chess RDCS Referring Phys: [id number] [place]  Sonographer Comments: Image acquisition challenging due to breast implants. Exam ended per patients request. IMPRESSIONS  1. Poor image quality due to breast implants and patient stopped exam before done.  2. Left ventricular ejection fraction, by estimation, is 60 to 65%. The left ventricle has normal function. The left ventricle has no regional wall motion abnormalities. Left ventricular diastolic parameters were normal.  3. Right ventricular systolic function is normal. The right ventricular size is normal. There is normal pulmonary artery systolic pressure.  4. The mitral valve is normal in structure. No evidence of mitral valve regurgitation. No evidence of mitral stenosis.  5. The aortic valve was not well visualized. Aortic valve regurgitation is not visualized. No aortic stenosis is present.  6. The inferior vena cava is normal in size with greater than 50% respiratory variability, suggesting right atrial pressure of 3 mmHg. FINDINGS  Left Ventricle: Left ventricular ejection fraction, by estimation, is 60 to 65%. The left ventricle has normal function. The left ventricle has no regional wall motion abnormalities. The left ventricular internal cavity size was normal in size. There is  no left ventricular hypertrophy. Left ventricular diastolic parameters were normal. Right Ventricle: The right ventricular size is normal. No increase in right ventricular wall thickness. Right ventricular systolic function is normal. There is normal pulmonary artery systolic pressure. The tricuspid regurgitant velocity is 2.28 m/s, and  with an assumed right atrial pressure of 8 mmHg, the estimated right ventricular systolic pressure is 20.3 mmHg. Left Atrium: Left atrial size was normal in size. Right Atrium: Right atrial size was normal in size. Pericardium: There is no evidence of pericardial effusion. Mitral Valve: The mitral valve is  normal in structure. No evidence of mitral valve regurgitation. No evidence of mitral valve stenosis. Tricuspid Valve: The tricuspid valve is normal in structure. Tricuspid valve regurgitation is not demonstrated. No evidence of tricuspid stenosis. Aortic Valve: The aortic valve was not well visualized. Aortic valve regurgitation is not visualized. No aortic stenosis is present. Pulmonic Valve: The pulmonic valve was normal in structure. Pulmonic valve regurgitation is not visualized. No evidence of pulmonic stenosis. Aorta: The aortic root is normal in size and structure. Venous: The inferior vena cava is normal in size with greater than 50% respiratory variability, suggesting right atrial pressure of 3 mmHg. IAS/Shunts: The interatrial septum was not  assessed. Additional Comments: Poor image quality due to breast implants and patient stopped exam before done.  LEFT VENTRICLE PLAX 2D LVIDd:         3.70 cm   Diastology LVIDs:         2.30 cm   LV e' medial:    8.03 cm/s LV PW:         1.00 cm   LV E/e' medial:  11.8 LV IVS:        1.10 cm   LV e' lateral:   7.62 cm/s LVOT diam:     1.90 cm   LV E/e' lateral: 12.4 LV SV:         57 LV SV Index:   32 LVOT Area:     2.84 cm  RIGHT VENTRICLE RV S prime:     16.30 cm/s TAPSE (M-mode): 1.6 cm LEFT ATRIUM             Index        RIGHT ATRIUM           Index LA diam:        3.50 cm 1.93 cm/m   RA Area:     12.90 cm LA Vol (A2C):   32.9 ml 18.14 ml/m  RA Volume:   27.80 ml  15.33 ml/m LA Vol (A4C):   29.7 ml 16.37 ml/m LA Biplane Vol: 31.6 ml 17.42 ml/m  AORTIC VALVE LVOT Vmax:   125.00 cm/s LVOT Vmean:  66.600 cm/s LVOT VTI:    0.202 m  AORTA Ao Root diam: 2.90 cm Ao Asc diam:  2.80 cm MITRAL VALVE               TRICUSPID VALVE MV Area (PHT): 3.99 cm    TR Peak grad:   20.8 mmHg MV Decel Time: 190 msec    TR Vmax:        228.00 cm/s MV E velocity: 94.70 cm/s MV A velocity: 90.00 cm/s  SHUNTS MV E/A ratio:  1.05        Systemic VTI:  0.20 m                             Systemic Diam: 1.90 cm Jenkins Rouge MD Electronically signed by Jenkins Rouge MD Signature Date/Time: 04/27/2021/12:08:59 PM    Final    US THORACENTESIS ASP PLEURAL SPACE W/IMG GUIDE  Result Date: 05/13/2021 INDICATION: Patient with history of shortness of breath found to have bilateral pleural effusions. Left greater than right. Request is for therapeutic and diagnostic thoracentesis EXAM: ULTRASOUND GUIDED DIAGNOSTIC AND THERAPEUTIC THORACENTESIS MEDICATIONS: Lidocaine 1% 10 ML COMPLICATIONS: None immediate. PROCEDURE: An ultrasound guided thoracentesis was thoroughly discussed with the patient and questions answered. The benefits, risks, alternatives and complications were also discussed. The patient understands and wishes to proceed with the procedure. Written consent was obtained. Ultrasound was performed to localize and mark an adequate pocket of fluid in the left chest. The area was then prepped and draped in the normal sterile fashion. 1% Lidocaine was used for local anesthesia. Under ultrasound guidance a 6 Fr Safe-T-Centesis catheter was introduced. Thoracentesis was performed. The catheter was removed and a dressing applied. FINDINGS: A total of approximately 700 mL of straw-colored fluid was removed. Patient unable to tolerate additional fluid removal at this time. Samples were sent to the laboratory as requested by the clinical team. IMPRESSION: Successful ultrasound guided diagnostic and therapeutic left-sided thoracentesis yielding 700 mL  of pleural fluid. Read by: Rushie Nyhan, NP Electronically Signed   By: Jacqulynn Cadet M.D.   On: 05/13/2021 11:57    Microbiology: Recent Results (from the past 240 hour(s))  Body fluid culture w Gram Stain     Status: None   Collection Time: 05/13/21 11:46 AM   Specimen: PATH Cytology Pleural fluid  Result Value Ref Range Status   Specimen Description   Final    PLEURAL Performed at [facility], [place] [address],  [place], [place] [address]    Special Requests   Final    NONE Performed at [facility], [place] [address] [place], [place] [address]    Gram Stain   Final    FEW WBC PRESENT, PREDOMINANTLY MONONUCLEAR NO ORGANISMS SEEN    Culture   Final    NO GROWTH 3 DAYS Performed at [place] [address], [place], [place] [address]    Report Status 05/16/2021 FINAL  Final     Labs: Basic Metabolic Panel: Recent Labs  Lab 05/12/21 0515 05/13/21 0501 05/14/21 0444 05/15/21 0442 05/16/21 0449  NA 135 134* 132* 131* 134*  K 3.8 4.1 4.1 3.8 3.9  CL 103 102 101 100 101  CO2 24 25 25 25 25   GLUCOSE 87 94 94 87 86  BUN 15 14 17 18 13   CREATININE 1.00 0.94 0.94 1.02* 0.88  CALCIUM 8.8* 8.8* 8.6* 8.5* 8.8*  MG  --   --   --   --  2.1   Liver Function Tests: Recent Labs  Lab 05/11/21 0550 05/12/21 0515 05/13/21 0501 05/14/21 0444 05/15/21 0442  AST 15 17 16 15 15   ALT 14 14 14 14 14   ALKPHOS 106 113 104 107 104  BILITOT 0.5 0.5 0.4 0.6 0.5  PROT 6.4* 6.2* 6.2* 6.0* 6.1*  ALBUMIN 3.4* 3.3* 3.2* 3.1* 3.3*   No results for input(s): LIPASE, AMYLASE in the last 168 hours. No results for input(s): AMMONIA in the last 168 hours. CBC: Recent Labs  Lab 05/12/21 0515 05/13/21 0501 05/14/21 0444 05/15/21 0442 05/16/21 0449  WBC 13.5* 11.3* 12.0* 11.2* 11.0*  HGB 10.8* 10.8* 10.7* 10.7* 11.4*  HCT 32.9* 33.2* 33.6* 33.7* 34.2*  MCV 92.7 94.3 94.9 93.9 92.7  PLT 372 347 337 339 341   Cardiac Enzymes: No results for input(s): CKTOTAL, CKMB, CKMBINDEX, TROPONINI in the last 168 hours. BNP: BNP (last 3 results) No results for input(s): BNP in the last 8760 hours.  ProBNP (last 3 results) No results for input(s): PROBNP in the last 8760 hours.  CBG: No results for input(s): GLUCAP in the last 168 hours.     Signed:  Irine Seal MD.  [facility] 05/16/2021, 2:48 PM

## 2021-05-16 NOTE — Plan of Care (Signed)
No acute events overnight. Pt anticipates discharge today. Problem: Education: Goal: Knowledge of General Education information will improve Description: Including pain rating scale, medication(s)/side effects and non-pharmacologic comfort measures Outcome: Adequate for Discharge   Problem: Health Behavior/Discharge Planning: Goal: Ability to manage health-related needs will improve Outcome: Adequate for Discharge   Problem: Clinical Measurements: Goal: Ability to maintain clinical measurements within normal limits will improve Outcome: Adequate for Discharge Goal: Will remain free from infection Outcome: Adequate for Discharge Goal: Diagnostic test results will improve Outcome: Adequate for Discharge Goal: Respiratory complications will improve Outcome: Adequate for Discharge Goal: Cardiovascular complication will be avoided Outcome: Adequate for Discharge   Problem: Activity: Goal: Risk for activity intolerance will decrease Outcome: Adequate for Discharge   Problem: Nutrition: Goal: Adequate nutrition will be maintained Outcome: Adequate for Discharge   Problem: Coping: Goal: Level of anxiety will decrease Outcome: Adequate for Discharge   Problem: Elimination: Goal: Will not experience complications related to bowel motility Outcome: Adequate for Discharge Goal: Will not experience complications related to urinary retention Outcome: Adequate for Discharge   Problem: Pain Managment: Goal: General experience of comfort will improve Outcome: Adequate for Discharge   Problem: Safety: Goal: Ability to remain free from injury will improve Outcome: Adequate for Discharge   Problem: Skin Integrity: Goal: Risk for impaired skin integrity will decrease Outcome: Adequate for Discharge

## 2021-05-17 ENCOUNTER — Telehealth: Payer: Self-pay

## 2021-05-17 NOTE — Telephone Encounter (Signed)
Transition Care Management Unsuccessful Follow-up Telephone Call  Date of discharge and from where:  [facility]/16/21 [facility]  Attempts:  1st Attempt  Reason for unsuccessful TCM follow-up call:  Left voice message. Will follow.

## 2021-05-20 NOTE — Telephone Encounter (Signed)
Transition Care Management Unsuccessful Follow-up Telephone Call  Date of discharge and from where:   [facility]/16/21 [facility]  Attempts:  2nd Attempt  Reason for unsuccessful TCM follow-up call:  Unable to reach patient. Hospital follow up scheduled 05/28/21 at 11:20. Will follow.

## 2021-05-21 NOTE — Telephone Encounter (Signed)
Transition Care Management Follow-up Telephone Call Date of discharge and from where: [facility]/16/21 [facility] How have you been since you were released from the hospital? Information received from sister on file (HIPAA compliant). Notes although patient is weak she is recovering without any new or worsening symptoms. Agrees to call as needed. Staying hydrated. Input/output appropriate.  Any questions or concerns? No  Items Reviewed: Did the pt receive and understand the discharge instructions provided? Yes  Medications obtained and verified? Yes  Any new allergies since your discharge? No  Dietary orders reviewed? Yes, regular diet. Do you have support at home? Yes   Home Care and Equipment/Supplies: Were home health services ordered? Yes. HHPT. If so, what is the name of the agency? [facility].   Functional Questionnaire: (I = Independent and D = Dependent) ADLs: Family assist  Bathing/Dressing- Family assist  Meal Prep- Family assist  Eating- I  Transferring/Ambulation- [place]  Managing Meds- I   Follow up appointments reviewed:  PCP Hospital f/u appt confirmed? Yes  Scheduled to see PCP on 05/28/21 @ 11:20. [facility] f/u appt confirmed? Yes , Gastroenterology 07/02/21 and  Oncology 05/30/21.  Are transportation arrangements needed? No  If their condition worsens, is the pt aware to call PCP or go to the Emergency Dept.? Yes Was the patient provided with contact information for the PCP's office or ED? Yes Was to pt encouraged to call back with questions or concerns? Yes

## 2021-05-28 ENCOUNTER — Encounter: Payer: Self-pay | Admitting: Family Medicine

## 2021-05-28 ENCOUNTER — Ambulatory Visit (INDEPENDENT_AMBULATORY_CARE_PROVIDER_SITE_OTHER): Payer: Medicare HMO | Admitting: Family Medicine

## 2021-05-28 VITALS — BP 132/78 | HR 74 | Temp 98.6°F | Resp 18 | Ht 66.0 in | Wt 152.0 lb

## 2021-05-28 DIAGNOSIS — R197 Diarrhea, unspecified: Secondary | ICD-10-CM | POA: Diagnosis not present

## 2021-05-28 DIAGNOSIS — C50919 Malignant neoplasm of unspecified site of unspecified female breast: Secondary | ICD-10-CM | POA: Diagnosis not present

## 2021-05-28 DIAGNOSIS — R112 Nausea with vomiting, unspecified: Secondary | ICD-10-CM

## 2021-05-28 LAB — CBC WITH DIFFERENTIAL/PLATELET
Basophils Absolute: 0 10*3/uL (ref 0.0–0.1)
Basophils Relative: 0.5 % (ref 0.0–3.0)
Eosinophils Absolute: 0.1 10*3/uL (ref 0.0–0.7)
Eosinophils Relative: 0.9 % (ref 0.0–5.0)
HCT: 40 % (ref 36.0–46.0)
Hemoglobin: 13.2 g/dL (ref 12.0–15.0)
Lymphocytes Relative: 10.4 % — ABNORMAL LOW (ref 12.0–46.0)
Lymphs Abs: 1 10*3/uL (ref 0.7–4.0)
MCHC: 32.9 g/dL (ref 30.0–36.0)
MCV: 89.5 fl (ref 78.0–100.0)
Monocytes Absolute: 0.6 10*3/uL (ref 0.1–1.0)
Monocytes Relative: 5.6 % (ref 3.0–12.0)
Neutro Abs: 8.4 10*3/uL — ABNORMAL HIGH (ref 1.4–7.7)
Neutrophils Relative %: 82.6 % — ABNORMAL HIGH (ref 43.0–77.0)
Platelets: 316 10*3/uL (ref 150.0–400.0)
RBC: 4.48 Mil/uL (ref 3.87–5.11)
RDW: 13 % (ref 11.5–15.5)
WBC: 10.1 10*3/uL (ref 4.0–10.5)

## 2021-05-28 LAB — COMPREHENSIVE METABOLIC PANEL
ALT: 11 U/L (ref 0–35)
AST: 15 U/L (ref 0–37)
Albumin: 4.1 g/dL (ref 3.5–5.2)
Alkaline Phosphatase: 100 U/L (ref 39–117)
BUN: 13 mg/dL (ref 6–23)
CO2: 24 mEq/L (ref 19–32)
Calcium: 9.3 mg/dL (ref 8.4–10.5)
Chloride: 97 mEq/L (ref 96–112)
Creatinine, Ser: 0.93 mg/dL (ref 0.40–1.20)
GFR: 58.43 mL/min — ABNORMAL LOW (ref >60.00)
Glucose, Bld: 93 mg/dL (ref 70–99)
Potassium: 4.5 mEq/L (ref 3.5–5.1)
Sodium: 129 mEq/L — ABNORMAL LOW (ref 135–145)
Total Bilirubin: 0.6 mg/dL (ref 0.2–1.2)
Total Protein: 6.8 g/dL (ref 6.0–8.3)

## 2021-05-28 LAB — MAGNESIUM: Magnesium: 2.2 mg/dL (ref 1.5–2.5)

## 2021-05-28 NOTE — Assessment & Plan Note (Signed)
Per oncology

## 2021-05-28 NOTE — Patient Instructions (Signed)
Nausea and Vomiting, Adult Nausea is the feeling that you have an upset stomach or that you are about to vomit. As nausea gets worse, it can lead to vomiting. Vomiting is when stomach contents forcefully come out of your mouth as a result of nausea. Vomiting can make you feel weak and cause you to become dehydrated. Dehydration can make you feel tired and thirsty, cause you to have a dry mouth, and decrease how often you urinate. Older adults and people with other diseases or a weak disease-fighting system (immune system) are at higher risk for dehydration. It is important to treat your nausea and vomiting as told by your health care provider. Follow these instructions at home: Watch your symptoms for any changes. Tell your health care provider about them. Eating and drinking   Take an oral rehydration solution (ORS). This is a drink that is sold at pharmacies and retail stores. Drink clear fluids slowly and in small amounts as you are able. Clear fluids include water, ice chips, low-calorie sports drinks, and fruit juice that has water added (diluted fruit juice). Eat bland, easy-to-digest foods in small amounts as you are able. These foods include bananas, applesauce, rice, lean meats, toast, and crackers. Avoid fluids that contain a lot of sugar or caffeine, such as energy drinks, sports drinks, and soda. Avoid alcohol. Avoid spicy or fatty foods. General instructions Take over-the-counter and prescription medicines only as told by your health care provider. Drink enough fluid to keep your urine pale yellow. Wash your hands often using soap and water for at least 20 seconds. If soap and water are not available, use hand sanitizer. Make sure that everyone in your household washes their hands well and often. Rest at home while you recover. Watch your condition for any changes. Take slow and deep breaths when you feel nauseous. Keep all follow-up visits. This is important. Contact a health care  provider if: Your symptoms get worse. You have new symptoms. You have a fever. You cannot drink fluids without vomiting. Your nausea does not go away after 2 days. You feel light-headed or dizzy. You have a headache. You have muscle cramps. You have a rash. You have pain while urinating. Get help right away if: You have pain in your chest, neck, arm, or jaw. You feel extremely weak or you faint. You have persistent vomiting. You have vomit that is bright red or looks like black coffee grounds. You have bloody or black stools (feces) or stools that look like tar. You have a severe headache, a stiff neck, or both. You have severe pain, cramping, or bloating in your abdomen. You have difficulty breathing, or you are breathing very quickly. Your heart is beating very quickly. Your skin feels cold and clammy. You feel confused. You have signs of dehydration, such as: Dark urine, very little urine, or no urine. Cracked lips. Dry mouth. Sunken eyes. Sleepiness. Weakness. These symptoms may be an emergency. Get help right away. Call 911. Do not wait to see if the symptoms will go away. Do not drive yourself to the hospital. Summary Nausea is the feeling that you have an upset stomach or that you are about to vomit. As nausea gets worse, it can lead to vomiting. Vomiting can make you feel weak and cause you to become dehydrated. Follow instructions from your health care provider about eating and drinking to prevent dehydration. Take over-the-counter and prescription medicines only as told by your health care provider. Contact your health care provider if  your symptoms get worse, or you have new symptoms. Keep all follow-up visits. This is important. This information is not intended to replace advice given to you by your health care provider. Make sure you discuss any questions you have with your health care provider. Document Revised: 10/19/2020 Document Reviewed: 10/19/2020 Elsevier  Patient Education  [place].

## 2021-05-28 NOTE — Progress Notes (Signed)
Established Patient Office Visit  Subjective:  Patient ID: Sara Barnes, female    DOB: 1940/08/18  Age: 81 y.o. MRN: [id number]  CC:  Chief Complaint  Patient presents with   Hospitalization Follow-up    Pt states still having diarrhea and nausea, no vomiting. Pt states still having SOB.     HPI Sara Barnes presents for f/u hosp for n/v/d.  12/23-1/19/2023.   The vomiting has stopped but she is still having diarrhea.  The zofran and lomotil help and she has a GI f/u as well as oncology.  We need to recheck labs.    Past Medical History:  Diagnosis Date   Allergy    Anemia    Anxiety    Arthritis    Basal cell carcinoma    Blood transfusion without reported diagnosis    Bowel perforation ([place])    Cancer of lower-inner quadrant of female breast ([place]) 11/30/2014   left   Cancer of lower-inner quadrant of left female breast ([place]) 06/28/2023   Complication of anesthesia    anxiety post-op after ear surgeries   Crohn's disease of large intestine with abscess ([place])    Dental crowns present    recent root canal 11/2014   Depression    Family history of adverse reaction to anesthesia    pt's sister has hx. of post-op N/V   GERD (gastroesophageal reflux disease)    Hyperlipidemia    Indigestion    Perianal abscess    Personal history of radiation therapy 2016   PONV (postoperative nausea and vomiting)    Runny nose 12/07/2014   clear drainage, per pt.   Seasonal allergies    TMJ (temporomandibular joint syndrome)    Ulcerative colitis    Vitamin B12 deficiency     Past Surgical History:  Procedure Laterality Date   ABDOMINAL HYSTERECTOMY     partial   APPENDECTOMY     AUGMENTATION MAMMAPLASTY Bilateral    BASAL CELL CARCINOMA EXCISION Left    nose   BREAST BIOPSY Left 2019   benign   BREAST ENHANCEMENT SURGERY     BREAST LUMPECTOMY Left 2016   BREAST LUMPECTOMY WITH RADIOACTIVE SEED AND SENTINEL LYMPH NODE BIOPSY Left 12/13/2014   Procedure: LEFT BREAST LUMPECTOMY  WITH RADIOACTIVE SEED AND SENTINEL LYMPH NODE BIOPSY;  Surgeon: [name], MD;  Location: [facility];  Service: General;  Laterality: Left;   COLONOSCOPY WITH PROPOFOL  05/28/2011; 06/28/2014   EXPLORATORY LAPAROTOMY  04/08/2018   with end ileostomy takedown and creation of loop ileostomy-- [facility] N/A 01/08/2016   Procedure: Beryle Quant;  Surgeon: [name], MD;  Location: [facility];  Service: Gastroenterology;  Laterality: N/A;  needs MAC due to pain and anxiety   ILEOSTOMY N/A 05/25/2017   Procedure: DILATION OF ILEOSTOMY;  Surgeon: Rolm Bookbinder, MD;  Location: [facility];  Service: General;  Laterality: N/A;   ILEOSTOMY  02/11/2016   END ileostomy.  Dr Hassell Done   ILEOSTOMY CLOSURE N/A 06/02/2017   Procedure: RESECTION OF ILEAL STRICTURE; ILEOSTOMY REVISION ;  Surgeon: Michael Boston, MD;  Location: [facility];  Service: General;  Laterality: N/A;   ILEOSTOMY DILATION  05/25/2017   DILITATION OF ILEAL STRICTURE   ileostomy stricture resection  06/02/2017   with revision   INCISION AND DRAINAGE ABSCESS  2018   INCISION AND DRAINAGE PERIRECTAL ABSCESS N/A 09/28/2013   Procedure: IRRIGATION AND DEBRIDEMENT PERIANAL ABSCESS, proctoscopy;  Surgeon: Shann Medal, MD;  Location: [facility];  Service: General;  Laterality: N/A;   INCISION AND DRAINAGE PERIRECTAL ABSCESS N/A 01/22/2016   Procedure: IRRIGATION AND DEBRIDEMENT PERIRECTAL ABSCESS;  Surgeon: Armandina Gemma, MD;  Location: [facility];  Service: General;  Laterality: N/A;   INNER EAR SURGERY Bilateral    LAPAROSCOPIC DIVERTED COLOSTOMY N/A 02/11/2016   Procedure: LAPAROSCOPIC DIVERTED ILEOSTOMY;  Surgeon: Johnathan Hausen, MD;  Location: [facility];  Service: General;  Laterality: N/A;   LAPAROSCOPY N/A 03/23/2017   Procedure: LAPAROSCOPY DIAGNOSTIC, EXPLORATORY LAPAROTOMY,  BOWEL RESECTION;  Surgeon: Jackolyn Confer, MD;  Location: [facility];  Service:  General;  Laterality: N/A;    Family History  Problem Relation Age of Onset   Prostate cancer Brother 45   Kidney disease Brother    Diabetes Brother    Brain cancer Father    Rheum arthritis Mother    Anesthesia problems Sister        post-op N/V   Emphysema Maternal Grandfather    Asthma Sister    Rheum arthritis Brother    Esophageal cancer Neg Hx    Breast cancer Neg Hx    Rectal cancer Neg Hx    Stomach cancer Neg Hx    Colon cancer Neg Hx     Social History   Socioeconomic History   Marital status: Widowed    Spouse name: Not on file   Number of children: 1   Years of education: Not on file   Highest education level: Not on file  Occupational History   Occupation: Warden/ranger    Comment: retired at age 36  Tobacco Use   Smoking status: Never   Smokeless tobacco: Never  Vaping Use   Vaping Use: Never used  Substance and Sexual Activity   Alcohol use: Yes    Alcohol/week: 7.0 standard drinks    Types: 7 Glasses of wine per week    Comment: wine daily   Drug use: No   Sexual activity: Not Currently    Partners: Male  Other Topics Concern   Not on file  Social History Narrative   Lives alone in own home   Right Handed    Drinks caffeine occasionally   Social Determinants of Health   Financial Resource Strain: Low Risk    Difficulty of Paying Living Expenses: Not hard at all  Food Insecurity: No Food Insecurity   Worried About Charity fundraiser in the Last Year: Never true   [place] in the Last Year: Never true  Transportation Needs: No Transportation Needs   Lack of Transportation (Medical): No   Lack of Transportation (Non-Medical): No  Physical Activity: Sufficiently Active   Days of Exercise per Week: 7 days   Minutes of Exercise per Session: 30 min  Stress: No Stress Concern Present   Feeling of Stress : Not at all  Social Connections: Moderately Isolated   Frequency of Communication with Friends and Family: More than three times  a week   Frequency of Social Gatherings with Friends and Family: Once a week   Attends Religious Services: More than 4 times per year   Active Member of [employer] or Organizations: No   Attends Archivist Meetings: Never   Marital Status: Widowed  Human resources officer Violence: Not At Risk   Fear of Current or Ex-Partner: No   Emotionally Abused: No   Physically Abused: No   Sexually Abused: No    Outpatient Medications Prior to Visit  Medication Sig Dispense Refill   acetaminophen (TYLENOL) 500 MG tablet You can take 2 tablets every 6 hours as needed for pain.  Use this as your primary pain control.  Use Tramadol as a last resort.  You can buy this over the counter at any drug store.    DO NOT TAKE MORE THAN 4000 MG OF TYLENOL PER DAY.  IT CAN HARM YOUR LIVER. 30 tablet 0   cholestyramine light (PREVALITE) 4 g packet Take 1 packet (4 g total) by mouth daily. 42 each 1   cyanocobalamin (,VITAMIN B-12,) 1000 MCG/ML injection As directed (Patient taking differently: 1,000 mcg every 30 (thirty) days. First Sunday of each month) 3 mL 11   diclofenac Sodium (VOLTAREN) 1 % GEL Apply 2 g topically 4 (four) times daily.     diphenoxylate-atropine (LOMOTIL) 2.5-0.025 MG tablet Take 1-2 tablets by mouth 3-4 times daily AS NEEDED for severe diarrhea 240 tablet 6   dronabinol (MARINOL) 5 MG capsule Take 1 capsule (5 mg total) by mouth 2 (two) times daily before lunch and supper. 60 capsule 0   famotidine (PEPCID) 20 MG tablet Take 1 tablet (20 mg total) by mouth 2 (two) times daily. (Patient taking differently: Take 20 mg by mouth daily as needed for indigestion.) 180 tablet 6   fentaNYL (DURAGESIC) 25 MCG/HR Place 1 patch onto the skin every 3 (three) days. 5 patch 0   FLUoxetine (PROZAC) 20 MG capsule Take 1 capsule (20 mg total) by mouth daily. 90 capsule 3   fluticasone (FLONASE) 50 MCG/ACT nasal spray Place 2 sprays into both nostrils daily. (Patient taking differently: Place 2 sprays into both  nostrils daily as needed for rhinitis.) 16 g 6   gabapentin (NEURONTIN) 100 MG capsule Take 3 capsules (300 mg total) by mouth 3 (three) times daily. (Patient taking differently: Take 300 mg by mouth at bedtime as needed (for neuropathy).) 270 capsule 6   HYDROmorphone (DILAUDID) 2 MG tablet Take 1 tablet (2 mg total) by mouth every 4 (four) hours as needed for severe pain. 15 tablet 0   loperamide (IMODIUM) 2 MG capsule Take 1 capsule (2 mg total) by mouth as needed for diarrhea or loose stools. 90 capsule 1   metoprolol tartrate (LOPRESSOR) 25 MG tablet Take 0.5 tablets (12.5 mg total) by mouth 2 (two) times daily. 60 tablet 1   Multiple Vitamin (MULTIVITAMIN WITH MINERALS) TABS tablet Take 1 tablet by mouth daily.     Nutritional Supplements (FEEDING SUPPLEMENT, KATE FARMS STANDARD 1.4,) LIQD liquid Take 325 mLs by mouth 2 (two) times daily between meals.     Nystatin (GERHARDT'S BUTT CREAM) CREA Apply 1 application topically as needed for irritation. 1 each 0   ondansetron (ZOFRAN-ODT) 4 MG disintegrating tablet DISSOLVE ONE TABLET BY MOUTH EVERY 6 HOURS AS NEEDED FOR NAUSEA AND VOMITING 90 tablet 6   predniSONE (DELTASONE) 5 MG tablet TAKE 1 TABLET(5 MG) BY MOUTH DAILY 90 tablet 1   saccharomyces boulardii (FLORASTOR) 250 MG capsule Take 1 capsule (250 mg total) by mouth 2 (two) times daily.     zolpidem (AMBIEN) 10 MG tablet Take 1 tablet (10 mg total) by mouth at bedtime as needed for sleep. 90 tablet 0   No facility-administered medications prior to visit.    Allergies  Allergen Reactions   Nsaids Other (See Comments)    Crohn's disease = NO NSAIDs   Codeine Nausea And Vomiting   Mesalamine Nausea And Vomiting   Oxycodone Itching and Rash  Lactose Intolerance (Gi) Diarrhea   Norco [Hydrocodone-Acetaminophen] Nausea Only   Other Itching    PERFUMED LOTIONS  Cant have MRI due to ear implant    Sulfa Antibiotics Rash    ROS Review of Systems  Constitutional:  Positive for  fatigue. Negative for activity change, appetite change and unexpected weight change.  Respiratory:  Negative for cough and shortness of breath.   Cardiovascular:  Negative for chest pain and palpitations.  Gastrointestinal:  Positive for diarrhea and nausea. Negative for abdominal pain and vomiting.  Psychiatric/Behavioral:  Negative for behavioral problems and dysphoric mood. The patient is not nervous/anxious.      Objective:    Physical Exam Vitals and nursing note reviewed.  Constitutional:      Appearance: She is well-developed.  HENT:     Head: Normocephalic and atraumatic.  Eyes:     Conjunctiva/sclera: Conjunctivae normal.  Neck:     Thyroid: No thyromegaly.     Vascular: No carotid bruit or JVD.  Cardiovascular:     Rate and Rhythm: Normal rate and regular rhythm.     Heart sounds: Normal heart sounds. No murmur heard. Pulmonary:     Effort: Pulmonary effort is normal. No respiratory distress.     Breath sounds: Normal breath sounds. No wheezing or rales.  Chest:     Chest wall: No tenderness.  Musculoskeletal:     Cervical back: Normal range of motion and neck supple.  Neurological:     Mental Status: She is alert and oriented to person, place, and time.    BP 132/78 (BP Location: Right Arm, Patient Position: Sitting, Cuff Size: Normal)    Pulse 74    Temp 98.6 F (37 C) (Oral)    Resp 18    Ht _0  (1.676 m)    Wt 152 lb (68.9 kg)    SpO2 98%    BMI 24.53 kg/m  Wt Readings from Last 3 Encounters:  05/28/21 152 lb (68.9 kg)  05/10/21 167 lb 5.3 oz (75.9 kg)  03/26/21 162 lb (73.5 kg)     Health Maintenance Due  Topic Date Due   Zoster Vaccines- Shingrix (1 of 2) Never done   TETANUS/TDAP  08/11/2016   COVID-19 Vaccine (3 - Pfizer risk series) 10/10/2019    There are no preventive care reminders to display for this patient.  Lab Results  Component Value Date   TSH 1.286 04/26/2021   Lab Results  Component Value Date   WBC 11.0 (H) 05/16/2021   HGB  11.4 (L) 05/16/2021   HCT 34.2 (L) 05/16/2021   MCV 92.7 05/16/2021   PLT 341 05/16/2021   Lab Results  Component Value Date   NA 134 (L) 05/16/2021   K 3.9 05/16/2021   CHLORIDE 106 12/06/2014   CO2 25 05/16/2021   GLUCOSE 86 05/16/2021   BUN 13 05/16/2021   CREATININE 0.88 05/16/2021   BILITOT 0.5 05/15/2021   ALKPHOS 104 05/15/2021   AST 15 05/15/2021   ALT 14 05/15/2021   PROT 6.1 (L) 05/15/2021   ALBUMIN 3.3 (L) 05/15/2021   CALCIUM 8.8 (L) 05/16/2021   ANIONGAP 8 05/16/2021   EGFR 78 (L) 12/06/2014   GFR 63.47 01/03/2021   Lab Results  Component Value Date   CHOL 188 03/26/2020   Lab Results  Component Value Date   HDL 67.80 03/26/2020   Lab Results  Component Value Date   LDLCALC 70 10/02/2016   Lab Results  Component Value Date  TRIG 203.0 (H) 03/26/2020   Lab Results  Component Value Date   CHOLHDL 3 03/26/2020   Lab Results  Component Value Date   HGBA1C 5.5 11/12/2020      Assessment & Plan:   Problem List Items Addressed This Visit       Unprioritized   Diarrhea    Per GI  con't lomotil       Intractable vomiting with nausea - Primary    Improving  F/u GI and oncology      Relevant Orders   CBC with Differential/Platelet   Comprehensive metabolic panel   Magnesium   Metastatic breast cancer [facility])    Per oncology       No orders of the defined types were placed in this encounter.   Follow-up: No follow-ups on file.    Ann Held, DO

## 2021-05-28 NOTE — Assessment & Plan Note (Signed)
Per GI  con't lomotil

## 2021-05-28 NOTE — Assessment & Plan Note (Signed)
Improving  F/u GI and oncology

## 2021-05-28 NOTE — Assessment & Plan Note (Signed)
>>  ASSESSMENT AND PLAN FOR BREAST CANCER (HCC) WRITTEN ON 05/28/2021  1:05 PM BY LOWNE CHASE, Candida Chalk, DO  Per oncology

## 2021-05-29 ENCOUNTER — Other Ambulatory Visit: Payer: Self-pay | Admitting: Family Medicine

## 2021-05-29 DIAGNOSIS — E871 Hypo-osmolality and hyponatremia: Secondary | ICD-10-CM

## 2021-05-30 ENCOUNTER — Inpatient Hospital Stay: Payer: Medicare HMO

## 2021-05-30 ENCOUNTER — Encounter: Payer: Self-pay | Admitting: Hematology

## 2021-05-30 ENCOUNTER — Inpatient Hospital Stay: Payer: Medicare HMO | Attending: Hematology | Admitting: Hematology

## 2021-05-30 ENCOUNTER — Other Ambulatory Visit: Payer: Self-pay

## 2021-05-30 VITALS — BP 125/77 | HR 71 | Temp 98.2°F | Resp 20 | Ht 66.0 in | Wt 153.2 lb

## 2021-05-30 DIAGNOSIS — Z170 Estrogen receptor positive status [ER+]: Secondary | ICD-10-CM

## 2021-05-30 DIAGNOSIS — K5090 Crohn's disease, unspecified, without complications: Secondary | ICD-10-CM | POA: Insufficient documentation

## 2021-05-30 DIAGNOSIS — Z5111 Encounter for antineoplastic chemotherapy: Secondary | ICD-10-CM | POA: Insufficient documentation

## 2021-05-30 DIAGNOSIS — C7951 Secondary malignant neoplasm of bone: Secondary | ICD-10-CM | POA: Insufficient documentation

## 2021-05-30 DIAGNOSIS — C50919 Malignant neoplasm of unspecified site of unspecified female breast: Secondary | ICD-10-CM

## 2021-05-30 DIAGNOSIS — C787 Secondary malignant neoplasm of liver and intrahepatic bile duct: Secondary | ICD-10-CM | POA: Insufficient documentation

## 2021-05-30 DIAGNOSIS — A0472 Enterocolitis due to Clostridium difficile, not specified as recurrent: Secondary | ICD-10-CM | POA: Insufficient documentation

## 2021-05-30 DIAGNOSIS — C792 Secondary malignant neoplasm of skin: Secondary | ICD-10-CM

## 2021-05-30 DIAGNOSIS — C50312 Malignant neoplasm of lower-inner quadrant of left female breast: Secondary | ICD-10-CM

## 2021-05-30 DIAGNOSIS — Z90710 Acquired absence of both cervix and uterus: Secondary | ICD-10-CM | POA: Diagnosis not present

## 2021-05-30 DIAGNOSIS — F419 Anxiety disorder, unspecified: Secondary | ICD-10-CM | POA: Insufficient documentation

## 2021-05-30 DIAGNOSIS — R160 Hepatomegaly, not elsewhere classified: Secondary | ICD-10-CM

## 2021-05-30 MED ORDER — FULVESTRANT 250 MG/5ML IM SOSY
500.0000 mg | PREFILLED_SYRINGE | Freq: Once | INTRAMUSCULAR | Status: AC
  Administered 2021-05-30: 500 mg via INTRAMUSCULAR
  Filled 2021-05-30: qty 10

## 2021-05-30 MED ORDER — HYDROMORPHONE HCL 2 MG PO TABS: 1.0000 mg | ORAL_TABLET | Freq: Three times a day (TID) | ORAL | 0 refills | Status: DC | PRN

## 2021-05-30 MED ORDER — ALPRAZOLAM 0.25 MG PO TABS: 0.2500 mg | ORAL_TABLET | Freq: Two times a day (BID) | ORAL | 0 refills | Status: DC | PRN

## 2021-05-30 NOTE — Progress Notes (Signed)
[place]   [phone] [phone]   Clinic Follow up Note   Patient Care Team: Carollee Herter, Alferd Apa, DO as PCP - General [name], MD as Consulting Physician (Breast Surgery) Pyrtle, Lajuan Lines, MD as Consulting Physician (Gastroenterology) Druscilla Brownie, MD as Referring Physician (Dermatology) Claudia Desanctis Steffanie Dunn, MD as Consulting Physician (Plastic Surgery) Truitt Merle, MD as Consulting Physician (Hematology)  Date of Service:  05/30/2021  CHIEF COMPLAINT: f/u of metastatic breast cancer  CURRENT THERAPY:  Fulvestrant injections, starting 05/15/21  ASSESSMENT & PLAN:  Sara Barnes is a 81 y.o. female with   1. 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 Barry Dienes. Oncotype RS of 4 (low risk). She received adjuvant radiation. She attempted letrozole previously but did not tolerate due to colitis (pt has Crohn's). -she developed a rash in early 2022. Excisional biopsy by Dr. Claudia Desanctis on 07/25/20 confirmed invasive carcinoma, felt to represent recurrence of breast cancer. She declined further work up with [place] PET scan and was subsequently lost to f/u. -she presented to the ED on 04/19/21 with nausea, vomiting, and diarrhea. She was diagnosed with C.diff colitis but was also found to have liver lesions on CT. Liver biopsy on 04/24/21 confirmed metastatic carcinoma, consistent with known mammary carcinoma. Her2 negative by FISH. -she was also noted to have bilateral pleural effusions on CT AP. Chest CT on 05/11/21: showed lower lobe compressive atelectasis, L>R; nonspecific right hilar lymphadenopathy; lytic bony lesions involving left 5th rib, left T5 transverse process, and left aspect of T10 vertebral body. Pleural fluid from thoracentesis on 05/13/21 showed only atypical mesothelial cells. -after a lengthy discussion, she agreed to  fulvestrant injections. She received her first dose on 05/15/21 and is due for second dose today. -Due to her colitis, she may not tolerated CDK4/6 inhibitor well (due to potential diarrhea), pt is very concerns about any oral meds which can cause diarrhea and does not want to try at this point -per patient's sister, patient does not want to hear all the details about her metastatic breast cancer which can worsen her anxiety.  I spent extra time with her sister and son in my office to explain her cancer status and treatment options.  2. Pain, Nausea, Loss of Appetite and pain  -she was on fentanyl patch in the hospital. She notes she was afraid to use it as outpatient due to side effects with breathing. I assured them that she tolerated it well in the hospital, so she would tolerate well. -she also has dilaudid from the hospital, which she uses infrequently. I refilled for her today. -she was prescribed marinol for appetite, but she does not find it helpful.  I encouraged her to try higher dose 10 mg, she declines referral to dietician at this time. -She has right upper quadrant abdominal pain, and some back pain, she was prescribed with fentanyl patch but does not want to use it, she is only taking Dilaudid occasionally.  She has high pain tolerance.  3. Anxiety, h/o depression -she is on 20 mg generic Prozac. She does not feel this is helping for her new anxiety surrounding her cancer. -I will prescribe Xanax for her to use as needed. I advised her not to take it with her pain medication. -she declines referral to counseling at this time and would prefer to take medication.  4. Crohn's disease and  IBS -prior history of severe colitis -managed by Dr. Hilarie Fredrickson   PLAN: -proceed with second fulvestrant injection today  -I called in Xanax and refilled dilaudid. -lab, f/u, and fulvestrant in 2 and 6 weeks -pt declined nutrition consult, SW or psych referral for her anxiety   No problem-specific  Assessment & Plan notes found for this encounter.   SUMMARY OF ONCOLOGIC HISTORY: Oncology History Overview Note  Cancer of lower-inner quadrant of left female breast [facility])   Staging form: Breast, AJCC 7th Edition     Clinical stage from 12/06/2014: Stage IA (T1c, N0, M0) - Unsigned     Pathologic stage from 12/13/2014: Stage IA (T1c, N0, cM0) - Unsigned      Cancer of lower-inner quadrant of left female breast ([place])  11/28/2014 Breast US   Highly suspicious 1.6 cm irregular shadowing mass at the site of palpable concern in the subareolar lower inner quadrant of the left breast at approximately 630.   11/29/2014 Receptors her2   Estrogen Receptor: 100%, POSITIVE, STRONG STAINING INTENSITY (PERFORMED MANUALLY) Progesterone Receptor: 95%, POSITIVE, STRONG STAINING INTENSITY (PERFORMED MANUALLY) Proliferation Marker Ki67: 10% (PERFORMED MANUALLY), Her2neu negative   11/29/2014 Initial Biopsy   Breast, left, needle core biopsy, subareolar about 6:30 - INVASIVE DUCTAL CARCINOMA, SEE COMMENT.   11/30/2014 Initial Diagnosis   Cancer of lower-inner quadrant of left female breast   12/13/2014 Pathology Results   Left breast lumpectomy showed a 1.7 cm invasive ductal carcinoma, grade 1, margins were negative, (+) LVI, 5 Sentinel lymph nodes were negative,   12/13/2014 Oncotype testing   RS 4, which predicts 10-year risk of distant recurrence 4% with tamoxifen alone.   02/05/2015 - 02/26/2015 Radiation Therapy   left breast radiaiton    03/18/2015 -  Anti-estrogen oral therapy   She tried letrozole for 2-3 weeks, could not tolerate and stopped.   07/25/2020 Pathology Results   FINAL MICROSCOPIC DIAGNOSIS: A. SKIN, LEFT CHEST, EXCISION: - Invasive carcinoma involving dermis and subcutaneous soft tissue, consistent with patient's clinical history of invasive ductal carcinoma, see comment - Deep and lateral resection margins are negative for carcinoma  Appears grade I  ADDENDUM: PROGNOSTIC  INDICATOR RESULTS: Immunohistochemical and morphometric analysis performed manually The tumor cells are NEGATIVE for Her2 (1+). Estrogen Receptor: POSITIVE, 90%, STRONG STAINING Progesterone Receptor: POSITIVE, 95%, STRONG STAINING Proliferation Marker Ki-67: 5%    04/19/2021 Imaging   EXAM: CT ABDOMEN AND PELVIS WITH CONTRAST  IMPRESSION: 1. Multiple hypodense hepatic masses, as detailed above, highly suspicious for metastatic disease. Differential include infectious/inflammatory process. Further evaluation with MRI examination with contrast would be helpful. 2. Moderate left and small right pleural effusion. 3. Postsurgical changes for prior hemicolectomy. No bowel obstruction, colitis or diverticulitis.   04/24/2021 Pathology Results   FINAL MICROSCOPIC DIAGNOSIS:   A. LIVER, BIOPSY:  -  Metastatic carcinoma  -  See comment   COMMENT:  By immunohistochemistry, the neoplastic cells are positive for  cytokeratin 7, ER (100%, strong), GATA3 and PAX8 (patchy weak) but negative for cytokeratin 20 and TTF-1.  Overall, the morphology and immunophenotype supports metastasis from the patient's known mammary carcinoma.   ADDENDUM:  By immunohistochemistry, HER-2 is EQUIVOCAL (2+).    ADDENDUM:  FLOURESCENCE IN-SITU HYBRIDIZATION RESULTS:  GROUP 5:   HER2 **NEGATIVE**    05/11/2021 Imaging   EXAM: CT CHEST WITH CONTRAST  IMPRESSION: 1. Bilateral pleural effusions and lower lobe compressive atelectasis, left greater than right. 2. Nonspecific right hilar lymphadenopathy. 3. Bony lytic lesions involving the left posterior  fifth rib, left T5 transverse process, and left aspect of the T10 vertebral body, consistent with bony metastases. 4. Patchy right upper lobe ground-glass airspace disease, which may be inflammatory or infectious. 5. Slight decrease in size of the left lobe subcapsular liver hematoma. No evidence of active hemorrhage. 6. Stable liver hypodensities consistent  with presumed metastatic disease. Please correlate with previous biopsy results.   05/13/2021 Pathology Results   Specimen Submitted:  A. PLEURAL FLUID, LEFT, THORACENTESIS:   FINAL MICROSCOPIC DIAGNOSIS:  - Atypical mesothelial cells present  - Chronic inflammation       INTERVAL HISTORY:  Sara Barnes is here for a follow up of metastatic breast cancer. She was last seen by me on 05/14/21 while she was in the hospital. She presents to the clinic accompanied by her sister and son. Her sister is staying with her since her discharge from the hospital. She feels she is not improving since her discharge. She notes she is able to walk on her own, but she does not do much else. She reports diarrhea 3-4x per day, which is her baseline from the colitis. She also notes a lot of anxiety.   All other systems were reviewed with the patient and are negative.  MEDICAL HISTORY:  Past Medical History:  Diagnosis Date   Allergy    Anemia    Anxiety    Arthritis    Basal cell carcinoma    Blood transfusion without reported diagnosis    Bowel perforation ([place])    Cancer of lower-inner quadrant of female breast ([place]) 11/30/2014   left   Cancer of lower-inner quadrant of left female breast ([place]) 06/06/9240   Complication of anesthesia    anxiety post-op after ear surgeries   Crohn's disease of large intestine with abscess ([place])    Dental crowns present    recent root canal 11/2014   Depression    Family history of adverse reaction to anesthesia    pt's sister has hx. of post-op N/V   GERD (gastroesophageal reflux disease)    Hyperlipidemia    Indigestion    Perianal abscess    Personal history of radiation therapy 2016   PONV (postoperative nausea and vomiting)    Runny nose 12/07/2014   clear drainage, per pt.   Seasonal allergies    TMJ (temporomandibular joint syndrome)    Ulcerative colitis    Vitamin B12 deficiency     SURGICAL HISTORY: Past Surgical History:  Procedure Laterality  Date   ABDOMINAL HYSTERECTOMY     partial   APPENDECTOMY     AUGMENTATION MAMMAPLASTY Bilateral    BASAL CELL CARCINOMA EXCISION Left    nose   BREAST BIOPSY Left 2019   benign   BREAST ENHANCEMENT SURGERY     BREAST LUMPECTOMY Left 2016   BREAST LUMPECTOMY WITH RADIOACTIVE SEED AND SENTINEL LYMPH NODE BIOPSY Left 12/13/2014   Procedure: LEFT BREAST LUMPECTOMY WITH RADIOACTIVE SEED AND SENTINEL LYMPH NODE BIOPSY;  Surgeon: [name], MD;  Location: [place];  Service: General;  Laterality: Left;   COLONOSCOPY WITH PROPOFOL  05/28/2011; 06/28/2014   EXPLORATORY LAPAROTOMY  04/08/2018   with end ileostomy takedown and creation of loop ileostomy-- [facility]-Dr [place] N/A 01/08/2016   Procedure: Beryle Quant;  Surgeon: [name], MD;  Location: [facility];  Service: Gastroenterology;  Laterality: N/A;  needs MAC due to pain and anxiety   ILEOSTOMY N/A 05/25/2017   Procedure:  DILATION OF ILEOSTOMY;  Surgeon: Rolm Bookbinder, MD;  Location: [facility];  Service: General;  Laterality: N/A;   ILEOSTOMY  02/11/2016   END ileostomy.  Dr Hassell Done   ILEOSTOMY CLOSURE N/A 06/02/2017   Procedure: RESECTION OF ILEAL STRICTURE; ILEOSTOMY REVISION ;  Surgeon: Michael Boston, MD;  Location: [facility];  Service: General;  Laterality: N/A;   ILEOSTOMY DILATION  05/25/2017   DILITATION OF ILEAL STRICTURE   ileostomy stricture resection  06/02/2017   with revision   INCISION AND DRAINAGE ABSCESS  2018   INCISION AND DRAINAGE PERIRECTAL ABSCESS N/A 09/28/2013   Procedure: IRRIGATION AND DEBRIDEMENT PERIANAL ABSCESS, proctoscopy;  Surgeon: Shann Medal, MD;  Location: [facility];  Service: General;  Laterality: N/A;   INCISION AND DRAINAGE PERIRECTAL ABSCESS N/A 01/22/2016   Procedure: IRRIGATION AND DEBRIDEMENT PERIRECTAL ABSCESS;  Surgeon: Armandina Gemma, MD;  Location: [facility];  Service: General;  Laterality: N/A;   INNER EAR  SURGERY Bilateral    LAPAROSCOPIC DIVERTED COLOSTOMY N/A 02/11/2016   Procedure: LAPAROSCOPIC DIVERTED ILEOSTOMY;  Surgeon: Johnathan Hausen, MD;  Location: [facility];  Service: General;  Laterality: N/A;   LAPAROSCOPY N/A 03/23/2017   Procedure: LAPAROSCOPY DIAGNOSTIC, EXPLORATORY LAPAROTOMY,  BOWEL RESECTION;  Surgeon: Jackolyn Confer, MD;  Location: [facility];  Service: General;  Laterality: N/A;    I have reviewed the social history and family history with the patient and they are unchanged from previous note.  ALLERGIES:  is allergic to nsaids, codeine, mesalamine, oxycodone, lactose intolerance (gi), norco [hydrocodone-acetaminophen], other, and sulfa antibiotics.  MEDICATIONS:  Current Outpatient Medications  Medication Sig Dispense Refill   ALPRAZolam (XANAX) 0.25 MG tablet Take 1-2 tablets (0.25-0.5 mg total) by mouth 2 (two) times daily as needed for anxiety. 30 tablet 0   acetaminophen (TYLENOL) 500 MG tablet You can take 2 tablets every 6 hours as needed for pain.  Use this as your primary pain control.  Use Tramadol as a last resort.  You can buy this over the counter at any drug store.    DO NOT TAKE MORE THAN 4000 MG OF TYLENOL PER DAY.  IT CAN HARM YOUR LIVER. 30 tablet 0   cholestyramine light (PREVALITE) 4 g packet Take 1 packet (4 g total) by mouth daily. 42 each 1   cyanocobalamin (,VITAMIN B-12,) 1000 MCG/ML injection As directed (Patient taking differently: 1,000 mcg every 30 (thirty) days. First Sunday of each month) 3 mL 11   diclofenac Sodium (VOLTAREN) 1 % GEL Apply 2 g topically 4 (four) times daily.     diphenoxylate-atropine (LOMOTIL) 2.5-0.025 MG tablet Take 1-2 tablets by mouth 3-4 times daily AS NEEDED for severe diarrhea 240 tablet 6   dronabinol (MARINOL) 5 MG capsule Take 1 capsule (5 mg total) by mouth 2 (two) times daily before lunch and supper. 60 capsule 0   famotidine (PEPCID) 20 MG tablet Take 1 tablet (20 mg total) by mouth 2 (two) times daily. (Patient  taking differently: Take 20 mg by mouth daily as needed for indigestion.) 180 tablet 6   FLUoxetine (PROZAC) 20 MG capsule Take 1 capsule (20 mg total) by mouth daily. 90 capsule 3   fluticasone (FLONASE) 50 MCG/ACT nasal spray Place 2 sprays into both nostrils daily. (Patient taking differently: Place 2 sprays into both nostrils daily as needed for rhinitis.) 16 g 6   gabapentin (NEURONTIN) 100 MG capsule Take 3 capsules (300 mg total) by mouth 3 (three) times daily. (Patient taking differently: Take 300 mg by mouth at  bedtime as needed (for neuropathy).) 270 capsule 6   HYDROmorphone (DILAUDID) 2 MG tablet Take 0.5-1 tablets (1-2 mg total) by mouth every 8 (eight) hours as needed for severe pain. 20 tablet 0   loperamide (IMODIUM) 2 MG capsule Take 1 capsule (2 mg total) by mouth as needed for diarrhea or loose stools. 90 capsule 1   metoprolol tartrate (LOPRESSOR) 25 MG tablet Take 0.5 tablets (12.5 mg total) by mouth 2 (two) times daily. 60 tablet 1   Multiple Vitamin (MULTIVITAMIN WITH MINERALS) TABS tablet Take 1 tablet by mouth daily.     Nutritional Supplements (FEEDING SUPPLEMENT, KATE FARMS STANDARD 1.4,) LIQD liquid Take 325 mLs by mouth 2 (two) times daily between meals.     Nystatin (GERHARDT'S BUTT CREAM) CREA Apply 1 application topically as needed for irritation. 1 each 0   ondansetron (ZOFRAN-ODT) 4 MG disintegrating tablet DISSOLVE ONE TABLET BY MOUTH EVERY 6 HOURS AS NEEDED FOR NAUSEA AND VOMITING 90 tablet 6   predniSONE (DELTASONE) 5 MG tablet TAKE 1 TABLET(5 MG) BY MOUTH DAILY 90 tablet 1   saccharomyces boulardii (FLORASTOR) 250 MG capsule Take 1 capsule (250 mg total) by mouth 2 (two) times daily.     zolpidem (AMBIEN) 10 MG tablet Take 1 tablet (10 mg total) by mouth at bedtime as needed for sleep. 90 tablet 0   No current facility-administered medications for this visit.    PHYSICAL EXAMINATION: ECOG PERFORMANCE STATUS: 3 - Symptomatic, >50% confined to bed  Vitals:    05/30/21 1248  BP: 125/77  Pulse: 71  Resp: 20  Temp: 98.2 F (36.8 C)  SpO2: 98%   Wt Readings from Last 3 Encounters:  05/30/21 153 lb 3.2 oz (69.5 kg)  05/28/21 152 lb (68.9 kg)  05/10/21 167 lb 5.3 oz (75.9 kg)     GENERAL:alert, no distress and comfortable SKIN: skin color normal, no rashes or significant lesions EYES: normal, Conjunctiva are pink and non-injected, sclera clear  NEURO: alert & oriented x 3 with fluent speech  LABORATORY DATA:  I have reviewed the data as listed CBC Latest Ref Rng & Units 05/28/2021 05/16/2021 05/15/2021  WBC 4.0 - 10.5 K/uL 10.1 11.0(H) 11.2(H)  Hemoglobin 12.0 - 15.0 g/dL 13.2 11.4(L) 10.7(L)  Hematocrit 36.0 - 46.0 % 40.0 34.2(L) 33.7(L)  Platelets 150.0 - 400.0 K/uL 316.0 341 339     CMP Latest Ref Rng & Units 05/28/2021 05/16/2021 05/15/2021  Glucose 70 - 99 mg/dL 93 86 87  BUN 6 - 23 mg/dL _0 Creatinine 0.40 - 1.20 mg/dL 0.93 0.88 1.02(H)  Sodium 135 - 145 mEq/L 129(L) 134(L) 131(L)  Potassium 3.5 - 5.1 mEq/L 4.5 3.9 3.8  Chloride 96 - 112 mEq/L 97 101 100  CO2 19 - 32 mEq/L _1 Calcium 8.4 - 10.5 mg/dL 9.3 8.8(L) 8.5(L)  Total Protein 6.0 - 8.3 g/dL 6.8 - 6.1(L)  Total Bilirubin 0.2 - 1.2 mg/dL 0.6 - 0.5  Alkaline Phos 39 - 117 U/L 100 - 104  AST 0 - 37 U/L 15 - 15  ALT 0 - 35 U/L 11 - 14      RADIOGRAPHIC STUDIES: I have personally reviewed the radiological images as listed and agreed with the findings in the report. No results found.    No orders of the defined types were placed in this encounter.  All questions were answered. The patient knows to call the clinic with any problems, questions or concerns. No barriers to learning was  detected. The total time spent in the appointment was 40 minutes.     Truitt Merle, MD 05/30/2021   I, Wilburn Mylar, am acting as scribe for Truitt Merle, MD.   I have reviewed the above documentation for accuracy and completeness, and I agree with the above.

## 2021-05-30 NOTE — Patient Instructions (Signed)
Fulvestrant injection What is this medication? FULVESTRANT (ful VES trant) blocks the effects of estrogen. It is used to treat breast cancer. This medicine may be used for other purposes; ask your health care provider or pharmacist if you have questions. COMMON BRAND NAME(S): FASLODEX What should I tell my care team before I take this medication? They need to know if you have any of these conditions: bleeding disorders liver disease low blood counts, like low white cell, platelet, or red cell counts an unusual or allergic reaction to fulvestrant, other medicines, foods, dyes, or preservatives pregnant or trying to get pregnant breast-feeding How should I use this medication? This medicine is for injection into a muscle. It is usually given by a health care professional in a hospital or clinic setting. Talk to your pediatrician regarding the use of this medicine in children. Special care may be needed. Overdosage: If you think you have taken too much of this medicine contact a poison control center or emergency room at once. NOTE: This medicine is only for you. Do not share this medicine with others. What if I miss a dose? It is important not to miss your dose. Call your doctor or health care professional if you are unable to keep an appointment. What may interact with this medication? medicines that treat or prevent blood clots like warfarin, enoxaparin, dalteparin, apixaban, dabigatran, and rivaroxaban This list may not describe all possible interactions. Give your health care provider a list of all the medicines, herbs, non-prescription drugs, or dietary supplements you use. Also tell them if you smoke, drink alcohol, or use illegal drugs. Some items may interact with your medicine. What should I watch for while using this medication? Your condition will be monitored carefully while you are receiving this medicine. You will need important blood work done while you are taking this  medicine. Do not become pregnant while taking this medicine or for at least 1 year after stopping it. Women of child-bearing potential will need to have a negative pregnancy test before starting this medicine. Women should inform their doctor if they wish to become pregnant or think they might be pregnant. There is a potential for serious side effects to an unborn child. Men should inform their doctors if they wish to father a child. This medicine may lower sperm counts. Talk to your health care professional or pharmacist for more information. Do not breast-feed an infant while taking this medicine or for 1 year after the last dose. What side effects may I notice from receiving this medication? Side effects that you should report to your doctor or health care professional as soon as possible: allergic reactions like skin rash, itching or hives, swelling of the face, lips, or tongue feeling faint or lightheaded, falls pain, tingling, numbness, or weakness in the legs signs and symptoms of infection like fever or chills; cough; flu-like symptoms; sore throat vaginal bleeding Side effects that usually do not require medical attention (report to your doctor or health care professional if they continue or are bothersome): aches, pains constipation diarrhea headache hot flashes nausea, vomiting pain at site where injected stomach pain This list may not describe all possible side effects. Call your doctor for medical advice about side effects. You may report side effects to FDA at 1-800-FDA-1088. Where should I keep my medication? This drug is given in a hospital or clinic and will not be stored at home. NOTE: This sheet is a summary. It may not cover all possible information. If you have  questions about this medicine, talk to your doctor, pharmacist, or health care provider.  2022 Elsevier/Gold Standard (2017-07-28 00:00:00)

## 2021-05-31 ENCOUNTER — Telehealth: Payer: Self-pay | Admitting: Hematology

## 2021-05-31 NOTE — Telephone Encounter (Signed)
Scheduled follow-up appointments per 2/2 los. Patient is aware.

## 2021-06-11 ENCOUNTER — Other Ambulatory Visit (INDEPENDENT_AMBULATORY_CARE_PROVIDER_SITE_OTHER): Payer: Medicare HMO

## 2021-06-11 DIAGNOSIS — E871 Hypo-osmolality and hyponatremia: Secondary | ICD-10-CM

## 2021-06-12 LAB — BASIC METABOLIC PANEL
BUN: 10 mg/dL (ref 6–23)
CO2: 22 mEq/L (ref 19–32)
Calcium: 9 mg/dL (ref 8.4–10.5)
Chloride: 99 mEq/L (ref 96–112)
Creatinine, Ser: 0.86 mg/dL (ref 0.40–1.20)
GFR: 64.16 mL/min (ref >60.00)
Glucose, Bld: 100 mg/dL — ABNORMAL HIGH (ref 70–99)
Potassium: 4.2 mEq/L (ref 3.5–5.1)
Sodium: 129 mEq/L — ABNORMAL LOW (ref 135–145)

## 2021-06-13 ENCOUNTER — Other Ambulatory Visit: Payer: Self-pay | Admitting: Hematology

## 2021-06-13 DIAGNOSIS — C50919 Malignant neoplasm of unspecified site of unspecified female breast: Secondary | ICD-10-CM

## 2021-06-13 NOTE — Telephone Encounter (Signed)
Refilled 2 weeks ago by Dr. Burr Medico

## 2021-06-14 ENCOUNTER — Ambulatory Visit: Payer: Medicare HMO | Admitting: Hematology

## 2021-06-14 ENCOUNTER — Other Ambulatory Visit: Payer: Self-pay

## 2021-06-14 ENCOUNTER — Inpatient Hospital Stay: Payer: Medicare HMO

## 2021-06-14 ENCOUNTER — Ambulatory Visit: Payer: Medicare HMO

## 2021-06-14 ENCOUNTER — Other Ambulatory Visit: Payer: Medicare HMO

## 2021-06-14 ENCOUNTER — Inpatient Hospital Stay: Payer: Medicare HMO | Admitting: Hematology

## 2021-06-14 VITALS — BP 115/74 | HR 77 | Temp 97.9°F | Resp 18 | Wt 151.5 lb

## 2021-06-14 DIAGNOSIS — C50919 Malignant neoplasm of unspecified site of unspecified female breast: Secondary | ICD-10-CM | POA: Diagnosis not present

## 2021-06-14 DIAGNOSIS — Z170 Estrogen receptor positive status [ER+]: Secondary | ICD-10-CM

## 2021-06-14 DIAGNOSIS — C7951 Secondary malignant neoplasm of bone: Secondary | ICD-10-CM | POA: Diagnosis not present

## 2021-06-14 DIAGNOSIS — C50312 Malignant neoplasm of lower-inner quadrant of left female breast: Secondary | ICD-10-CM

## 2021-06-14 DIAGNOSIS — C787 Secondary malignant neoplasm of liver and intrahepatic bile duct: Secondary | ICD-10-CM | POA: Diagnosis not present

## 2021-06-14 DIAGNOSIS — K5090 Crohn's disease, unspecified, without complications: Secondary | ICD-10-CM | POA: Diagnosis not present

## 2021-06-14 DIAGNOSIS — I509 Heart failure, unspecified: Secondary | ICD-10-CM | POA: Diagnosis not present

## 2021-06-14 DIAGNOSIS — Z5111 Encounter for antineoplastic chemotherapy: Secondary | ICD-10-CM | POA: Diagnosis not present

## 2021-06-14 DIAGNOSIS — R160 Hepatomegaly, not elsewhere classified: Secondary | ICD-10-CM

## 2021-06-14 DIAGNOSIS — C792 Secondary malignant neoplasm of skin: Secondary | ICD-10-CM

## 2021-06-14 DIAGNOSIS — A0472 Enterocolitis due to Clostridium difficile, not specified as recurrent: Secondary | ICD-10-CM | POA: Diagnosis not present

## 2021-06-14 DIAGNOSIS — F419 Anxiety disorder, unspecified: Secondary | ICD-10-CM | POA: Diagnosis not present

## 2021-06-14 DIAGNOSIS — Z90710 Acquired absence of both cervix and uterus: Secondary | ICD-10-CM | POA: Diagnosis not present

## 2021-06-14 LAB — CMP (CANCER CENTER ONLY)
ALT: 9 U/L (ref 0–44)
AST: 13 U/L — ABNORMAL LOW (ref 15–41)
Albumin: 3.9 g/dL (ref 3.5–5.0)
Alkaline Phosphatase: 90 U/L (ref 38–126)
Anion gap: 6 (ref 5–15)
BUN: 9 mg/dL (ref 8–23)
CO2: 25 mmol/L (ref 22–32)
Calcium: 9 mg/dL (ref 8.9–10.3)
Chloride: 102 mmol/L (ref 98–111)
Creatinine: 0.85 mg/dL (ref 0.44–1.00)
GFR, Estimated: >60 mL/min (ref >60)
Glucose, Bld: 101 mg/dL — ABNORMAL HIGH (ref 70–99)
Potassium: 3.7 mmol/L (ref 3.5–5.1)
Sodium: 133 mmol/L — ABNORMAL LOW (ref 135–145)
Total Bilirubin: 0.5 mg/dL (ref 0.3–1.2)
Total Protein: 6.6 g/dL (ref 6.5–8.1)

## 2021-06-14 LAB — CBC WITH DIFFERENTIAL (CANCER CENTER ONLY)
Abs Immature Granulocytes: 0.02 10*3/uL (ref 0.00–0.07)
Basophils Absolute: 0 10*3/uL (ref 0.0–0.1)
Basophils Relative: 0 %
Eosinophils Absolute: 0.1 10*3/uL (ref 0.0–0.5)
Eosinophils Relative: 1 %
HCT: 41.4 % (ref 36.0–46.0)
Hemoglobin: 13.9 g/dL (ref 12.0–15.0)
Immature Granulocytes: 0 %
Lymphocytes Relative: 12 %
Lymphs Abs: 1.1 10*3/uL (ref 0.7–4.0)
MCH: 30.2 pg (ref 26.0–34.0)
MCHC: 33.6 g/dL (ref 30.0–36.0)
MCV: 89.8 fL (ref 80.0–100.0)
Monocytes Absolute: 0.7 10*3/uL (ref 0.1–1.0)
Monocytes Relative: 7 %
Neutro Abs: 7.2 10*3/uL (ref 1.7–7.7)
Neutrophils Relative %: 80 %
Platelet Count: 271 10*3/uL (ref 150–400)
RBC: 4.61 MIL/uL (ref 3.87–5.11)
RDW: 12.6 % (ref 11.5–15.5)
WBC Count: 9.2 10*3/uL (ref 4.0–10.5)
nRBC: 0 % (ref 0.0–0.2)

## 2021-06-14 MED ORDER — FULVESTRANT 250 MG/5ML IM SOSY
500.0000 mg | PREFILLED_SYRINGE | Freq: Once | INTRAMUSCULAR | Status: AC
  Administered 2021-06-14: 500 mg via INTRAMUSCULAR
  Filled 2021-06-14: qty 10

## 2021-06-14 MED ORDER — ALPRAZOLAM 0.25 MG PO TABS: 0.2500 mg | ORAL_TABLET | Freq: Two times a day (BID) | ORAL | 0 refills | Status: DC | PRN

## 2021-06-14 NOTE — Progress Notes (Signed)
Sara Barnes   Telephone:([phone]) [phone] Fax:(336) [phone]   Clinic Follow up Note   Patient Care Team: Carollee Herter, Alferd Apa, DO as PCP - General [name], MD as Consulting Physician (Breast Surgery) Pyrtle, Lajuan Lines, MD as Consulting Physician (Gastroenterology) Druscilla Brownie, MD as Referring Physician (Dermatology) Claudia Desanctis Steffanie Dunn, MD as Consulting Physician (Plastic Surgery) Truitt Merle, MD as Consulting Physician (Hematology)  Date of Service:  06/15/2021  CHIEF COMPLAINT: f/u of metastatic breast cancer  CURRENT THERAPY:  Fulvestrant injections, starting 05/15/21  ASSESSMENT & PLAN:  Sara Barnes is a 81 y.o. female with   1. 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 Barry Dienes. Oncotype RS of 4 (low risk). She received adjuvant radiation. She attempted letrozole previously but did not tolerate due to colitis (pt has Crohn's). -she developed a rash in early 2022. Excisional biopsy by Dr. Claudia Desanctis on 07/25/20 confirmed invasive carcinoma, felt to represent recurrence of breast cancer. She declined further work up with [place] PET scan and was subsequently lost to f/u. -she presented to the ED on 04/19/21 with nausea, vomiting, and diarrhea. She was diagnosed with C.diff colitis but was also found to have liver lesions on CT. Liver biopsy on 04/24/21 confirmed metastatic carcinoma, consistent with known mammary carcinoma. Her2 negative by FISH. -she was also noted to have bilateral pleural effusions on CT AP. Chest CT on 05/11/21: showed lower lobe compressive atelectasis, L>R; nonspecific right hilar lymphadenopathy; lytic bony lesions involving left 5th rib, left T5 transverse process, and left aspect of T10 vertebral body. Pleural fluid from thoracentesis on 05/13/21 showed only atypical mesothelial cells.  -after a lengthy discussion, she agreed to  fulvestrant injections. She received her first dose on 05/15/21. -Due to her colitis, she may not tolerate CDK4/6 inhibitor well (due to potential diarrhea), pt is very concerned about any oral meds which can cause diarrhea and does not want to try at this point -per patient's sister, patient does not want to hear all the details about her metastatic breast cancer which can worsen her anxiety.   -she is clinically doing better. labs reviewed, overall stable. We will proceed with third dose today and move to every 4 weeks. We will plan to repeat a scan in 2 more months   2. Pain, Nausea, Loss of Appetite -she has dilaudid from the hospital, which she uses infrequently.  -she was prescribed marinol for appetite, but she did not find it helpful. She is working to eat better on her own.   3. Anxiety, h/o depression -she is on 20 mg generic Prozac. She does not feel this is helping for her new anxiety surrounding her cancer. -I prescribed Xanax on 05/30/21 for her to use as needed. She is taking one tablet twice a day every day. I will refill today. -she previously declined referral to counseling.   4. Crohn's disease and IBS -prior history of severe colitis -managed by Dr. Hilarie Barnes     PLAN: -proceed with third fulvestrant injection today  -I refilled Xanax -lab, f/u, and fulvestrant in 4 weeks, will order restaging scan on next visit    No problem-specific Assessment & Plan notes found for this encounter.   SUMMARY OF ONCOLOGIC HISTORY: Oncology History Overview Note  Cancer of lower-inner quadrant of left female breast [facility])   Staging form: Breast, AJCC 7th Edition     Clinical  stage from 12/06/2014: Stage IA (T1c, N0, M0) - Unsigned     Pathologic stage from 12/13/2014: Stage IA (T1c, N0, cM0) - Unsigned      Cancer of lower-inner quadrant of left female breast ([place])  11/28/2014 Breast US   Highly suspicious 1.6 cm irregular shadowing mass at the site of palpable concern in the  subareolar lower inner quadrant of the left breast at approximately 630.   11/29/2014 Receptors her2   Estrogen Receptor: 100%, POSITIVE, STRONG STAINING INTENSITY (PERFORMED MANUALLY) Progesterone Receptor: 95%, POSITIVE, STRONG STAINING INTENSITY (PERFORMED MANUALLY) Proliferation Marker Ki67: 10% (PERFORMED MANUALLY), Her2neu negative   11/29/2014 Initial Biopsy   Breast, left, needle core biopsy, subareolar about 6:30 - INVASIVE DUCTAL CARCINOMA, SEE COMMENT.   11/30/2014 Initial Diagnosis   Cancer of lower-inner quadrant of left female breast   12/13/2014 Pathology Results   Left breast lumpectomy showed a 1.7 cm invasive ductal carcinoma, grade 1, margins were negative, (+) LVI, 5 Sentinel lymph nodes were negative,   12/13/2014 Oncotype testing   RS 4, which predicts 10-year risk of distant recurrence 4% with tamoxifen alone.   02/05/2015 - 02/26/2015 Radiation Therapy   left breast radiaiton    03/18/2015 -  Anti-estrogen oral therapy   She tried letrozole for 2-3 weeks, could not tolerate and stopped.   07/25/2020 Pathology Results   FINAL MICROSCOPIC DIAGNOSIS: A. SKIN, LEFT CHEST, EXCISION: - Invasive carcinoma involving dermis and subcutaneous soft tissue, consistent with patient's clinical history of invasive ductal carcinoma, see comment - Deep and lateral resection margins are negative for carcinoma  Appears grade I  ADDENDUM: PROGNOSTIC INDICATOR RESULTS: Immunohistochemical and morphometric analysis performed manually The tumor cells are NEGATIVE for Her2 (1+). Estrogen Receptor: POSITIVE, 90%, STRONG STAINING Progesterone Receptor: POSITIVE, 95%, STRONG STAINING Proliferation Marker Ki-67: 5%    04/19/2021 Imaging   EXAM: CT ABDOMEN AND PELVIS WITH CONTRAST  IMPRESSION: 1. Multiple hypodense hepatic masses, as detailed above, highly suspicious for metastatic disease. Differential include infectious/inflammatory process. Further evaluation with MRI examination  with contrast would be helpful. 2. Moderate left and small right pleural effusion. 3. Postsurgical changes for prior hemicolectomy. No bowel obstruction, colitis or diverticulitis.   04/24/2021 Pathology Results   FINAL MICROSCOPIC DIAGNOSIS:   A. LIVER, BIOPSY:  -  Metastatic carcinoma  -  See comment   COMMENT:  By immunohistochemistry, the neoplastic cells are positive for  cytokeratin 7, ER (100%, strong), GATA3 and PAX8 (patchy weak) but negative for cytokeratin 20 and TTF-1.  Overall, the morphology and immunophenotype supports metastasis from the patient's known mammary carcinoma.   ADDENDUM:  By immunohistochemistry, HER-2 is EQUIVOCAL (2+).    ADDENDUM:  FLOURESCENCE IN-SITU HYBRIDIZATION RESULTS:  GROUP 5:   HER2 **NEGATIVE**    05/11/2021 Imaging   EXAM: CT CHEST WITH CONTRAST  IMPRESSION: 1. Bilateral pleural effusions and lower lobe compressive atelectasis, left greater than right. 2. Nonspecific right hilar lymphadenopathy. 3. Bony lytic lesions involving the left posterior fifth rib, left T5 transverse process, and left aspect of the T10 vertebral body, consistent with bony metastases. 4. Patchy right upper lobe ground-glass airspace disease, which may be inflammatory or infectious. 5. Slight decrease in size of the left lobe subcapsular liver hematoma. No evidence of active hemorrhage. 6. Stable liver hypodensities consistent with presumed metastatic disease. Please correlate with previous biopsy results.   05/13/2021 Pathology Results   Specimen Submitted:  A. PLEURAL FLUID, LEFT, THORACENTESIS:   FINAL MICROSCOPIC DIAGNOSIS:  - Atypical mesothelial cells present  -  Chronic inflammation       INTERVAL HISTORY:  ORVETTA DANIELSKI is here for a follow up of metastatic breast cancer. She was last seen by me on 05/30/21. She presents to the clinic accompanied by her sister. She reports she is eating better. She states she is taking one Xanax twice a day.    All other systems were reviewed with the patient and are negative.  MEDICAL HISTORY:  Past Medical History:  Diagnosis Date   Allergy    Anemia    Anxiety    Arthritis    Basal cell carcinoma    Blood transfusion without reported diagnosis    Bowel perforation ([place])    Cancer of lower-inner quadrant of female breast ([place]) 11/30/2014   left   Cancer of lower-inner quadrant of left female breast ([place]) 05/03/3844   Complication of anesthesia    anxiety post-op after ear surgeries   Crohn's disease of large intestine with abscess ([place])    Dental crowns present    recent root canal 11/2014   Depression    Family history of adverse reaction to anesthesia    pt's sister has hx. of post-op N/V   GERD (gastroesophageal reflux disease)    Hyperlipidemia    Indigestion    Perianal abscess    Personal history of radiation therapy 2016   PONV (postoperative nausea and vomiting)    Runny nose 12/07/2014   clear drainage, per pt.   Seasonal allergies    TMJ (temporomandibular joint syndrome)    Ulcerative colitis    Vitamin B12 deficiency     SURGICAL HISTORY: Past Surgical History:  Procedure Laterality Date   ABDOMINAL HYSTERECTOMY     partial   APPENDECTOMY     AUGMENTATION MAMMAPLASTY Bilateral    BASAL CELL CARCINOMA EXCISION Left    nose   BREAST BIOPSY Left 2019   benign   BREAST ENHANCEMENT SURGERY     BREAST LUMPECTOMY Left 2016   BREAST LUMPECTOMY WITH RADIOACTIVE SEED AND SENTINEL LYMPH NODE BIOPSY Left 12/13/2014   Procedure: LEFT BREAST LUMPECTOMY WITH RADIOACTIVE SEED AND SENTINEL LYMPH NODE BIOPSY;  Surgeon: [name], MD;  Location: [facility];  Service: General;  Laterality: Left;   COLONOSCOPY WITH PROPOFOL  05/28/2011; 06/28/2014   EXPLORATORY LAPAROTOMY  04/08/2018   with end ileostomy takedown and creation of loop ileostomy-- [facility] [place] N/A 01/08/2016   Procedure: Beryle Quant;  Surgeon: [name], MD;  Location: [facility];  Service: Gastroenterology;  Laterality: N/A;  needs MAC due to pain and anxiety   ILEOSTOMY N/A 05/25/2017   Procedure: DILATION OF ILEOSTOMY;  Surgeon: Rolm Bookbinder, MD;  Location: [facility];  Service: General;  Laterality: N/A;   ILEOSTOMY  02/11/2016   END ileostomy.  Dr Hassell Done   ILEOSTOMY CLOSURE N/A 06/02/2017   Procedure: RESECTION OF ILEAL STRICTURE; ILEOSTOMY REVISION ;  Surgeon: Michael Boston, MD;  Location: [facility];  Service: General;  Laterality: N/A;   ILEOSTOMY DILATION  05/25/2017   DILITATION OF ILEAL STRICTURE   ileostomy stricture resection  06/02/2017   with revision   INCISION AND DRAINAGE ABSCESS  2018   INCISION AND DRAINAGE PERIRECTAL ABSCESS N/A 09/28/2013   Procedure: IRRIGATION AND DEBRIDEMENT PERIANAL ABSCESS, proctoscopy;  Surgeon: Shann Medal, MD;  Location: [facility];  Service: General;  Laterality: N/A;   INCISION AND DRAINAGE PERIRECTAL ABSCESS N/A 01/22/2016   Procedure: IRRIGATION AND DEBRIDEMENT PERIRECTAL  ABSCESS;  Surgeon: Armandina Gemma, MD;  Location: [facility];  Service: General;  Laterality: N/A;   INNER EAR SURGERY Bilateral    LAPAROSCOPIC DIVERTED COLOSTOMY N/A 02/11/2016   Procedure: LAPAROSCOPIC DIVERTED ILEOSTOMY;  Surgeon: Johnathan Hausen, MD;  Location: [facility];  Service: General;  Laterality: N/A;   LAPAROSCOPY N/A 03/23/2017   Procedure: LAPAROSCOPY DIAGNOSTIC, EXPLORATORY LAPAROTOMY,  BOWEL RESECTION;  Surgeon: Jackolyn Confer, MD;  Location: [facility];  Service: General;  Laterality: N/A;    I have reviewed the social history and family history with the patient and they are unchanged from previous note.  ALLERGIES:  is allergic to nsaids, codeine, mesalamine, oxycodone, lactose intolerance (gi), norco [hydrocodone-acetaminophen], other, and sulfa antibiotics.  MEDICATIONS:  Current Outpatient Medications  Medication Sig Dispense Refill   acetaminophen (TYLENOL) 500 MG  tablet You can take 2 tablets every 6 hours as needed for pain.  Use this as your primary pain control.  Use Tramadol as a last resort.  You can buy this over the counter at any drug store.    DO NOT TAKE MORE THAN 4000 MG OF TYLENOL PER DAY.  IT CAN HARM YOUR LIVER. 30 tablet 0   ALPRAZolam (XANAX) 0.25 MG tablet Take 1-2 tablets (0.25-0.5 mg total) by mouth 2 (two) times daily as needed for anxiety. 60 tablet 0   cyanocobalamin (,VITAMIN B-12,) 1000 MCG/ML injection As directed (Patient taking differently: 1,000 mcg every 30 (thirty) days. First Sunday of each month) 3 mL 11   diphenoxylate-atropine (LOMOTIL) 2.5-0.025 MG tablet Take 1-2 tablets by mouth 3-4 times daily AS NEEDED for severe diarrhea 240 tablet 6   famotidine (PEPCID) 20 MG tablet Take 1 tablet (20 mg total) by mouth 2 (two) times daily. (Patient taking differently: Take 20 mg by mouth daily as needed for indigestion.) 180 tablet 6   FLUoxetine (PROZAC) 20 MG capsule Take 1 capsule (20 mg total) by mouth daily. 90 capsule 3   fluticasone (FLONASE) 50 MCG/ACT nasal spray Place 2 sprays into both nostrils daily. (Patient taking differently: Place 2 sprays into both nostrils daily as needed for rhinitis.) 16 g 6   HYDROmorphone (DILAUDID) 2 MG tablet Take 0.5-1 tablets (1-2 mg total) by mouth every 8 (eight) hours as needed for severe pain. 20 tablet 0   loperamide (IMODIUM) 2 MG capsule Take 1 capsule (2 mg total) by mouth as needed for diarrhea or loose stools. 90 capsule 1   ondansetron (ZOFRAN-ODT) 4 MG disintegrating tablet DISSOLVE ONE TABLET BY MOUTH EVERY 6 HOURS AS NEEDED FOR NAUSEA AND VOMITING 90 tablet 6   predniSONE (DELTASONE) 5 MG tablet TAKE 1 TABLET(5 MG) BY MOUTH DAILY 90 tablet 1   zolpidem (AMBIEN) 10 MG tablet Take 1 tablet (10 mg total) by mouth at bedtime as needed for sleep. 90 tablet 0   No current facility-administered medications for this visit.    PHYSICAL EXAMINATION: ECOG PERFORMANCE STATUS: 2 -  Symptomatic, <50% confined to bed  Vitals:   06/14/21 1127  BP: 115/74  Pulse: 77  Resp: 18  Temp: 97.9 F (36.6 C)  SpO2: 97%   Wt Readings from Last 3 Encounters:  06/14/21 151 lb 8 oz (68.7 kg)  05/30/21 153 lb 3.2 oz (69.5 kg)  05/28/21 152 lb (68.9 kg)     GENERAL:alert, no distress and comfortable SKIN: skin color normal, no rashes or significant lesions EYES: normal, Conjunctiva are pink and non-injected, sclera clear  NEURO: alert & oriented x 3 with fluent speech  LABORATORY DATA:  I have reviewed the data as listed CBC Latest Ref Rng & Units 06/14/2021 05/28/2021 05/16/2021  WBC 4.0 - 10.5 K/uL 9.2 10.1 11.0(H)  Hemoglobin 12.0 - 15.0 g/dL 13.9 13.2 11.4(L)  Hematocrit 36.0 - 46.0 % 41.4 40.0 34.2(L)  Platelets 150 - 400 K/uL 271 316.0 341     CMP Latest Ref Rng & Units 06/14/2021 06/11/2021 05/28/2021  Glucose 70 - 99 mg/dL 101(H) 100(H) 93  BUN 8 - 23 mg/dL _0 Creatinine 0.44 - 1.00 mg/dL 0.85 0.86 0.93  Sodium 135 - 145 mmol/L 133(L) 129(L) 129(L)  Potassium 3.5 - 5.1 mmol/L 3.7 4.2 4.5  Chloride 98 - 111 mmol/L 102 99 97  CO2 22 - 32 mmol/L _1 Calcium 8.9 - 10.3 mg/dL 9.0 9.0 9.3  Total Protein 6.5 - 8.1 g/dL 6.6 - 6.8  Total Bilirubin 0.3 - 1.2 mg/dL 0.5 - 0.6  Alkaline Phos 38 - 126 U/L 90 - 100  AST 15 - 41 U/L 13(L) - 15  ALT 0 - 44 U/L 9 - 11      RADIOGRAPHIC STUDIES: I have personally reviewed the radiological images as listed and agreed with the findings in the report. No results found.    No orders of the defined types were placed in this encounter.  All questions were answered. The patient knows to call the clinic with any problems, questions or concerns. No barriers to learning was detected. The total time spent in the appointment was 30 minutes.     Truitt Merle, MD 06/15/2021   I, Wilburn Mylar, am acting as scribe for Truitt Merle, MD.   I have reviewed the above documentation for accuracy and completeness, and I agree  with the above.

## 2021-06-15 ENCOUNTER — Encounter: Payer: Self-pay | Admitting: Hematology

## 2021-06-15 LAB — CANCER ANTIGEN 27.29: CA 27.29: 375.9 U/mL — ABNORMAL HIGH (ref 0.0–38.6)

## 2021-06-28 ENCOUNTER — Encounter: Payer: Self-pay | Admitting: *Deleted

## 2021-07-02 ENCOUNTER — Encounter: Payer: Self-pay | Admitting: Internal Medicine

## 2021-07-02 ENCOUNTER — Ambulatory Visit: Payer: Medicare HMO | Admitting: Internal Medicine

## 2021-07-02 VITALS — BP 108/68 | HR 80 | Ht 66.0 in | Wt 152.0 lb

## 2021-07-02 DIAGNOSIS — F32A Depression, unspecified: Secondary | ICD-10-CM | POA: Diagnosis not present

## 2021-07-02 DIAGNOSIS — F5101 Primary insomnia: Secondary | ICD-10-CM | POA: Diagnosis not present

## 2021-07-02 DIAGNOSIS — F418 Other specified anxiety disorders: Secondary | ICD-10-CM | POA: Diagnosis not present

## 2021-07-02 DIAGNOSIS — C50919 Malignant neoplasm of unspecified site of unspecified female breast: Secondary | ICD-10-CM | POA: Diagnosis not present

## 2021-07-02 DIAGNOSIS — K50119 Crohn's disease of large intestine with unspecified complications: Secondary | ICD-10-CM

## 2021-07-02 MED ORDER — ZOLPIDEM TARTRATE 10 MG PO TABS: 10.0000 mg | ORAL_TABLET | Freq: Every evening | ORAL | 0 refills | Status: DC | PRN

## 2021-07-02 MED ORDER — FLUOXETINE HCL 40 MG PO CAPS: 40.0000 mg | ORAL_CAPSULE | Freq: Every day | ORAL | 1 refills | Status: DC

## 2021-07-02 NOTE — Patient Instructions (Signed)
We have sent the following medications to your pharmacy for you to pick up at your convenience: ?Ambien 10 mg ?Prozac 40 mg (this is an increase from your previous dosing) ? ?If you are age 80 or older, your body mass index should be between 23-30. Your Body mass index is 24.53 kg/m?Marland Kitchen If this is out of the aforementioned range listed, please consider follow up with your Primary Care Provider. ? ?If you are age 73 or younger, your body mass index should be between 19-25. Your Body mass index is 24.53 kg/m?Marland Kitchen If this is out of the aformentioned range listed, please consider follow up with your Primary Care Provider.  ? ?________________________________________________________ ? ?The [place] [facility] providers would like to encourage you to use [facility] to communicate with providers for non-urgent requests or questions.  Due to long hold times on the telephone, sending your provider a message by [facility] may be a faster and more efficient way to get a response.  Please allow 48 business hours for a response.  Please remember that this is for non-urgent requests.  ?_______________________________________________________ ?Due to recent changes in healthcare laws, you may see the results of your imaging and laboratory studies on MyChart before your provider has had a chance to review them.  We understand that in some cases there may be results that are confusing or concerning to you. Not all laboratory results come back in the same time frame and the provider may be waiting for multiple results in order to interpret others.  Please give [place] 48 hours in order for your provider to thoroughly review all the results before contacting the office for clarification of your results.  ? ?

## 2021-07-03 ENCOUNTER — Encounter: Payer: Self-pay | Admitting: Internal Medicine

## 2021-07-03 NOTE — Progress Notes (Signed)
? ?Subjective:  ? ? Patient ID: Sara Barnes, female    DOB: [birth year], 81 y.o.   MRN: [id number] ? ?HPI ?Cydney Alvarenga is a 81 year old female with a history of complicated Crohn's disease with perianal involvement, prior end ileostomy with subsequent takedown in June 2020, prior right hemicolectomy after cecal perforation in the setting of severe Crohn's disease, history of GERD with dyspepsia, insomnia and depression who is here for follow-up.  Unfortunately metastatic breast cancer has been discovered since she was last here and she also had a prolonged hospitalization  from 04/19/2021 to 05/16/2021. ? ?Hospitalization including treatment for C. difficile, post liver biopsy hematoma, severe nausea, AKI, acute blood loss anemia after liver biopsy bleeding, paroxysmal atrial fibrillation, and left-sided pleural effusion requiring thoracentesis.  The liver biopsy did prove metastatic breast carcinoma. ? ?She has established and continued treatment with Dr. Burr Medico.  She reports that she has had significant increase in her anxiety since her hospitalization and related to her breast cancer.  She is taking Prozac 20 mg a 10 sparingly using lorazepam.  She is still using Ambien to help sleep. ? ?She is having nausea which is controlled with Zofran.  No vomiting.  She is having pain at times still in the right upper quadrant but also in her hands, back and knees.  She is only used 1 oral Dilaudid since leaving the hospital.  For the most part she is rarely needing loperamide or Lomotil.  Bowel movements are loose but not diarrhea.  Not bloody.  She still on prednisone 5 mg.  Her sister Vaughan Basta is helping significantly as she always has.  Her son Shea Evans is also involved. ? ? ?Review of Systems ?As per HPI, otherwise negative ? ?Current Medications, Allergies, Past Medical History, Past Surgical History, Family History and Social History were reviewed in [employer] record. ?   ?Objective:  ? Physical  Exam ?BP 108/68 (BP Location: Right Arm, Patient Position: Sitting, Cuff Size: Normal)   Pulse 80   Ht 5' 6"  (1.676 m)   Wt 152 lb (68.9 kg)   SpO2 98%   BMI 24.53 kg/m?  ?Gen: awake, alert, NAD ?HEENT: anicteric ?Neuro: nonfocal ? ? ?   ?Assessment & Plan:  ?81 year old female with a history of complicated Crohn's disease with perianal involvement, prior end ileostomy with subsequent takedown in June 2020, prior right hemicolectomy after cecal perforation in the setting of severe Crohn's disease, history of GERD with dyspepsia, insomnia and depression who is here for follow-up.   ? ?Metastatic breast cancer to liver and bone --following closely and receiving therapy with Dr. Burr Medico. ? ?2.  Anxiety exacerbated by cancer diagnosis/history of depression--she inquires about increasing fluoxetine dose which I am in complete agreement with.  She was previously on 40 mg though this was weaned down to 20 mg.  We discussed this at length and she agrees to raise the dose. ?--Increase fluoxetine to 40 mg daily ?--She can continue as needed alprazolam 0.25 mg 1 to 2 tablets twice daily as needed for anxiety ? ?3.  Crohn's colitis with perianal disease --she remains only on low-dose prednisone without desire to escalate therapy.  Escalation of therapy is not needed or recommended at this time given #1 ?--Continue prednisone 5 mg daily ?--Lomotil or loperamide as previously directed as needed for diarrhea ? ?4.  Insomnia --chronic Ambien 10 mg nightly which we will continue ? ?5.  Nausea --ondansetron 4 mg every 6 hours as needed ? ?6.  Dyspepsia --she has famotidine 20 mg which she can use every 12 hours as needed.  She is not needing this often. ? ? ? ?

## 2021-07-11 ENCOUNTER — Other Ambulatory Visit: Payer: Medicare HMO

## 2021-07-11 ENCOUNTER — Ambulatory Visit: Payer: Medicare HMO | Admitting: Hematology

## 2021-07-11 ENCOUNTER — Inpatient Hospital Stay (HOSPITAL_BASED_OUTPATIENT_CLINIC_OR_DEPARTMENT_OTHER): Payer: Medicare HMO | Admitting: Hematology

## 2021-07-11 ENCOUNTER — Encounter: Payer: Self-pay | Admitting: Hematology

## 2021-07-11 ENCOUNTER — Inpatient Hospital Stay: Payer: Medicare HMO

## 2021-07-11 ENCOUNTER — Other Ambulatory Visit: Payer: Self-pay

## 2021-07-11 ENCOUNTER — Ambulatory Visit: Payer: Medicare HMO

## 2021-07-11 ENCOUNTER — Inpatient Hospital Stay: Payer: Medicare HMO | Attending: Hematology

## 2021-07-11 VITALS — BP 121/78 | HR 90 | Temp 98.4°F | Resp 19 | Ht 66.0 in | Wt 153.0 lb

## 2021-07-11 DIAGNOSIS — Z5111 Encounter for antineoplastic chemotherapy: Secondary | ICD-10-CM | POA: Diagnosis not present

## 2021-07-11 DIAGNOSIS — Z170 Estrogen receptor positive status [ER+]: Secondary | ICD-10-CM | POA: Diagnosis not present

## 2021-07-11 DIAGNOSIS — C787 Secondary malignant neoplasm of liver and intrahepatic bile duct: Secondary | ICD-10-CM | POA: Diagnosis not present

## 2021-07-11 DIAGNOSIS — C50919 Malignant neoplasm of unspecified site of unspecified female breast: Secondary | ICD-10-CM | POA: Diagnosis not present

## 2021-07-11 DIAGNOSIS — C7951 Secondary malignant neoplasm of bone: Secondary | ICD-10-CM | POA: Insufficient documentation

## 2021-07-11 DIAGNOSIS — C50312 Malignant neoplasm of lower-inner quadrant of left female breast: Secondary | ICD-10-CM | POA: Diagnosis not present

## 2021-07-11 DIAGNOSIS — K5090 Crohn's disease, unspecified, without complications: Secondary | ICD-10-CM | POA: Insufficient documentation

## 2021-07-11 LAB — CBC WITH DIFFERENTIAL (CANCER CENTER ONLY)
Abs Immature Granulocytes: 0.03 10*3/uL (ref 0.00–0.07)
Basophils Absolute: 0.1 10*3/uL (ref 0.0–0.1)
Basophils Relative: 1 %
Eosinophils Absolute: 0.1 10*3/uL (ref 0.0–0.5)
Eosinophils Relative: 1 %
HCT: 42.6 % (ref 36.0–46.0)
Hemoglobin: 14.3 g/dL (ref 12.0–15.0)
Immature Granulocytes: 0 %
Lymphocytes Relative: 19 %
Lymphs Abs: 1.8 10*3/uL (ref 0.7–4.0)
MCH: 30.8 pg (ref 26.0–34.0)
MCHC: 33.6 g/dL (ref 30.0–36.0)
MCV: 91.6 fL (ref 80.0–100.0)
Monocytes Absolute: 0.7 10*3/uL (ref 0.1–1.0)
Monocytes Relative: 8 %
Neutro Abs: 6.7 10*3/uL (ref 1.7–7.7)
Neutrophils Relative %: 71 %
Platelet Count: 270 10*3/uL (ref 150–400)
RBC: 4.65 MIL/uL (ref 3.87–5.11)
RDW: 14.6 % (ref 11.5–15.5)
WBC Count: 9.3 10*3/uL (ref 4.0–10.5)
nRBC: 0 % (ref 0.0–0.2)

## 2021-07-11 LAB — CMP (CANCER CENTER ONLY)
ALT: 8 U/L (ref 0–44)
AST: 12 U/L — ABNORMAL LOW (ref 15–41)
Albumin: 3.8 g/dL (ref 3.5–5.0)
Alkaline Phosphatase: 96 U/L (ref 38–126)
Anion gap: 7 (ref 5–15)
BUN: 12 mg/dL (ref 8–23)
CO2: 23 mmol/L (ref 22–32)
Calcium: 9.1 mg/dL (ref 8.9–10.3)
Chloride: 104 mmol/L (ref 98–111)
Creatinine: 1.04 mg/dL — ABNORMAL HIGH (ref 0.44–1.00)
GFR, Estimated: 55 mL/min — ABNORMAL LOW (ref >60)
Glucose, Bld: 101 mg/dL — ABNORMAL HIGH (ref 70–99)
Potassium: 3.9 mmol/L (ref 3.5–5.1)
Sodium: 134 mmol/L — ABNORMAL LOW (ref 135–145)
Total Bilirubin: 0.5 mg/dL (ref 0.3–1.2)
Total Protein: 7 g/dL (ref 6.5–8.1)

## 2021-07-11 MED ORDER — ALPRAZOLAM 0.25 MG PO TABS: 0.2500 mg | ORAL_TABLET | Freq: Two times a day (BID) | ORAL | 0 refills | Status: DC | PRN

## 2021-07-11 MED ORDER — FULVESTRANT 250 MG/5ML IM SOSY
500.0000 mg | PREFILLED_SYRINGE | Freq: Once | INTRAMUSCULAR | Status: AC
  Administered 2021-07-11: 500 mg via INTRAMUSCULAR
  Filled 2021-07-11: qty 10

## 2021-07-11 NOTE — Progress Notes (Signed)
?Sara Barnes   ?Telephone:([phone] [phone]   ?Clinic Follow up Note  ? ?Patient Care Team: ?Sara Barnes, Sara Apa, DO as PCP - General ?Sara Klein, MD as Consulting Physician (Breast Surgery) ?Sara Barnes, Sara Lines, MD as Consulting Physician (Gastroenterology) ?Sara Brownie, MD as Referring Physician (Dermatology) ?Sara Presume, MD as Consulting Physician (Plastic Surgery) ?Sara Merle, MD as Consulting Physician (Hematology) ? ?Date of Service:  07/11/2021 ? ?CHIEF COMPLAINT: f/u of metastatic breast cancer ? ?CURRENT THERAPY:  ?Fulvestrant injections, starting 05/15/21, now q4weeks ? ?ASSESSMENT & PLAN:  ?Sara Barnes is a 81 y.o. female with  ? ?1. 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 Barry Dienes. Oncotype RS of 4 (low risk). She received adjuvant radiation. She attempted letrozole previously but did not tolerate due to colitis (pt has Crohn's). ?-she developed a rash in early 2022. Excisional biopsy by Dr. Claudia Desanctis on 07/25/20 confirmed invasive carcinoma, felt to represent recurrence of breast cancer. She declined further work up with [place] PET scan and was subsequently lost to f/u. ?-she presented to the ED on 04/19/21 with nausea, vomiting, and diarrhea. She was diagnosed with C.diff colitis but was also found to have liver lesions on CT. Liver biopsy on 04/24/21 confirmed metastatic carcinoma, consistent with known mammary carcinoma. Her2 negative by FISH. ?-she was also noted to have bilateral pleural effusions on CT AP. Chest CT on 05/11/21: showed lower lobe compressive atelectasis, L>R; nonspecific right hilar lymphadenopathy; lytic bony lesions involving left 5th rib, left T5 transverse process, and left aspect of T10 vertebral body. Pleural fluid from thoracentesis on 05/13/21 showed only atypical mesothelial cells.  ?-after a lengthy discussion, she  agreed to fulvestrant injections. She received her first dose on 05/15/21. ?-Due to her colitis, she may not tolerate CDK4/6 inhibitor well (due to potential diarrhea), pt is very concerned about any oral meds which can cause diarrhea and does not want to try at this point ?-per patient's sister, patient does not want to hear all the details about her metastatic breast cancer which can worsen her anxiety.   ?-she is clinically doing better. labs reviewed, overall stable. We will proceed with fourth dose today, now every 4 weeks. Due to planned vacation in May, we will push her injection out by a week.  ?-We will plan to repeat a scan in 2 more months; I ordered today. She declines oral contrast due to diarrhea. ?  ?2. Anxiety, h/o depression ?-she is on 20 mg generic Prozac. She does not feel this is helping for her new anxiety surrounding her cancer. ?-I prescribed Xanax on 05/30/21 for her to use as needed. She is taking one tablet twice a day every day. I will refill today. ?-she previously declined referral to counseling. ?  ?3. Crohn's disease and IBS ?-prior history of severe colitis ?-managed by Dr. Hilarie Fredrickson ?  ?  ?PLAN: ?-proceed with fulvestrant injection today  ?-I refilled Xanax ?-lab, f/u, and fulvestrant in 4 and 9 weeks ?-CT and bone scan to be done in 8-9 weeks  ? ? ?No problem-specific Assessment & Plan notes found for this encounter. ? ? ?SUMMARY OF ONCOLOGIC HISTORY: ?Oncology History Overview Note  ?Cancer of lower-inner quadrant of left female breast [facility]) ?  Staging form: Breast, AJCC 7th Edition ?    Clinical stage from 12/06/2014: Stage IA (T1c, N0, M0) - Unsigned ?  Pathologic stage from 12/13/2014: Stage IA (T1c, N0, cM0) - Unsigned ? ? ? ?  ?Cancer of lower-inner quadrant of left female breast [facility])  ?11/28/2014 Breast US  ? Highly suspicious 1.6 cm irregular shadowing mass at the site of palpable concern in the subareolar lower inner quadrant of the left breast at approximately 630. ?  ?11/29/2014  Receptors her2  ? Estrogen Receptor: 100%, POSITIVE, STRONG STAINING INTENSITY (PERFORMED MANUALLY) Progesterone Receptor: 95%, POSITIVE, STRONG STAINING INTENSITY (PERFORMED MANUALLY) Proliferation Marker Ki67: 10% (PERFORMED MANUALLY), Her2neu negative ?  ?11/29/2014 Initial Biopsy  ? Breast, left, needle core biopsy, subareolar about 6:30 - INVASIVE DUCTAL CARCINOMA, SEE COMMENT. ?  ?11/30/2014 Initial Diagnosis  ? Cancer of lower-inner quadrant of left female breast ?  ?12/13/2014 Pathology Results  ? Left breast lumpectomy showed a 1.7 cm invasive ductal carcinoma, grade 1, margins were negative, (+) LVI, 5 Sentinel lymph nodes were negative, ?  ?12/13/2014 Oncotype testing  ? RS 4, which predicts 10-year risk of distant recurrence 4% with tamoxifen alone. ?  ?02/05/2015 - 02/26/2015 Radiation Therapy  ? left breast radiaiton  ?  ?03/18/2015 -  Anti-estrogen oral therapy  ? She tried letrozole for 2-3 weeks, could not tolerate and stopped. ?  ?07/25/2020 Pathology Results  ? FINAL MICROSCOPIC DIAGNOSIS: ?A. SKIN, LEFT CHEST, EXCISION: ?- Invasive carcinoma involving dermis and subcutaneous soft tissue, consistent with patient's clinical ?history of invasive ductal carcinoma, see comment ?- Deep and lateral resection margins are negative for carcinoma ? ?Appears grade I ? ?ADDENDUM: ?PROGNOSTIC INDICATOR RESULTS: ?Immunohistochemical and morphometric analysis performed manually ?The tumor cells are NEGATIVE for Her2 (1+). ?Estrogen Receptor: POSITIVE, 90%, STRONG STAINING ?Progesterone Receptor: POSITIVE, 95%, STRONG STAINING ?Proliferation Marker Ki-67: 5% ? ?  ?04/19/2021 Imaging  ? EXAM: ?CT ABDOMEN AND PELVIS WITH CONTRAST ? ?IMPRESSION: ?1. Multiple hypodense hepatic masses, as detailed above, highly ?suspicious for metastatic disease. Differential include ?infectious/inflammatory process. Further evaluation with MRI ?examination with contrast would be helpful. ?2. Moderate left and small right pleural  effusion. ?3. Postsurgical changes for prior hemicolectomy. No bowel ?obstruction, colitis or diverticulitis. ?  ?04/24/2021 Pathology Results  ? FINAL MICROSCOPIC DIAGNOSIS:  ? ?A. LIVER, BIOPSY:  ?-  Metastatic carcinoma  ?-  See comment  ? ?COMMENT:  ?By immunohistochemistry, the neoplastic cells are positive for  ?cytokeratin 7, ER (100%, strong), GATA3 and PAX8 (patchy weak) but negative for cytokeratin 20 and TTF-1.  Overall, the morphology and immunophenotype supports metastasis from the patient's known mammary carcinoma.  ? ?ADDENDUM:  ?By immunohistochemistry, HER-2 is EQUIVOCAL (2+).   ? ?ADDENDUM:  ?FLOURESCENCE IN-SITU HYBRIDIZATION RESULTS:  ?GROUP 5:   HER2 **NEGATIVE**  ?  ?05/11/2021 Imaging  ? EXAM: ?CT CHEST WITH CONTRAST ? ?IMPRESSION: ?1. Bilateral pleural effusions and lower lobe compressive ?atelectasis, left greater than right. ?2. Nonspecific right hilar lymphadenopathy. ?3. Bony lytic lesions involving the left posterior fifth rib, left T5 transverse process, and left aspect of the T10 vertebral body, ?consistent with bony metastases. ?4. Patchy right upper lobe ground-glass airspace disease, which may be inflammatory or infectious. ?5. Slight decrease in size of the left lobe subcapsular liver ?hematoma. No evidence of active hemorrhage. ?6. Stable liver hypodensities consistent with presumed metastatic ?disease. Please correlate with previous biopsy results. ?  ?05/13/2021 Pathology Results  ? Specimen Submitted:  A. PLEURAL FLUID, LEFT, THORACENTESIS:  ? ?FINAL MICROSCOPIC DIAGNOSIS:  ?- Atypical mesothelial cells present  ?- Chronic inflammation  ?  ? ? ? ?INTERVAL HISTORY:  ?Sara Barnes  is here for a follow up of metastatic breast cancer. She was last seen by me on 06/15/21. She presents to the clinic accompanied by her sister. ?She reports she is not as anxious as before since being on Xanax. She also reports her appetite has much improved. She also denies needing pain medication. ?   ?All other systems were reviewed with the patient and are negative. ? ?MEDICAL HISTORY:  ?Past Medical History:  ?Diagnosis Date  ? Allergy   ? Anemia   ? Anxiety   ? Arthritis   ? Basal cell carcinoma   ? Blood tra

## 2021-07-12 LAB — CANCER ANTIGEN 27.29: CA 27.29: 269.8 U/mL — ABNORMAL HIGH (ref 0.0–38.6)

## 2021-07-28 ENCOUNTER — Encounter: Payer: Self-pay | Admitting: Hematology

## 2021-08-08 ENCOUNTER — Inpatient Hospital Stay: Payer: Medicare HMO | Attending: Hematology

## 2021-08-08 ENCOUNTER — Other Ambulatory Visit: Payer: Self-pay

## 2021-08-08 ENCOUNTER — Other Ambulatory Visit: Payer: Self-pay | Admitting: Hematology

## 2021-08-08 VITALS — BP 128/75 | HR 91 | Temp 97.9°F | Resp 18

## 2021-08-08 DIAGNOSIS — C7951 Secondary malignant neoplasm of bone: Secondary | ICD-10-CM | POA: Diagnosis not present

## 2021-08-08 DIAGNOSIS — C787 Secondary malignant neoplasm of liver and intrahepatic bile duct: Secondary | ICD-10-CM | POA: Insufficient documentation

## 2021-08-08 DIAGNOSIS — C792 Secondary malignant neoplasm of skin: Secondary | ICD-10-CM | POA: Diagnosis not present

## 2021-08-08 DIAGNOSIS — R160 Hepatomegaly, not elsewhere classified: Secondary | ICD-10-CM

## 2021-08-08 DIAGNOSIS — Z5111 Encounter for antineoplastic chemotherapy: Secondary | ICD-10-CM | POA: Insufficient documentation

## 2021-08-08 DIAGNOSIS — C50312 Malignant neoplasm of lower-inner quadrant of left female breast: Secondary | ICD-10-CM | POA: Diagnosis not present

## 2021-08-08 DIAGNOSIS — Z170 Estrogen receptor positive status [ER+]: Secondary | ICD-10-CM | POA: Diagnosis not present

## 2021-08-08 DIAGNOSIS — C50919 Malignant neoplasm of unspecified site of unspecified female breast: Secondary | ICD-10-CM

## 2021-08-08 MED ORDER — ALPRAZOLAM 0.25 MG PO TABS: 0.2500 mg | ORAL_TABLET | Freq: Two times a day (BID) | ORAL | 0 refills | Status: DC | PRN

## 2021-08-08 MED ORDER — FULVESTRANT 250 MG/5ML IM SOSY
500.0000 mg | PREFILLED_SYRINGE | Freq: Once | INTRAMUSCULAR | Status: AC
  Administered 2021-08-08: 500 mg via INTRAMUSCULAR
  Filled 2021-08-08: qty 10

## 2021-09-09 ENCOUNTER — Encounter (HOSPITAL_COMMUNITY)
Admission: RE | Admit: 2021-09-09 | Discharge: 2021-09-09 | Disposition: A | Discharge status: Home / Self Care | Payer: Medicare HMO | Source: Ambulatory Visit | Attending: Hematology | Admitting: Hematology

## 2021-09-09 ENCOUNTER — Ambulatory Visit (HOSPITAL_COMMUNITY)
Admission: RE | Admit: 2021-09-09 | Discharge: 2021-09-09 | Disposition: A | Discharge status: Home / Self Care | Payer: Medicare HMO | Source: Ambulatory Visit | Attending: Hematology | Admitting: Hematology

## 2021-09-09 DIAGNOSIS — C50919 Malignant neoplasm of unspecified site of unspecified female breast: Secondary | ICD-10-CM

## 2021-09-09 DIAGNOSIS — N281 Cyst of kidney, acquired: Secondary | ICD-10-CM | POA: Diagnosis not present

## 2021-09-09 DIAGNOSIS — J90 Pleural effusion, not elsewhere classified: Secondary | ICD-10-CM | POA: Diagnosis not present

## 2021-09-09 DIAGNOSIS — M19011 Primary osteoarthritis, right shoulder: Secondary | ICD-10-CM | POA: Diagnosis not present

## 2021-09-09 DIAGNOSIS — K7689 Other specified diseases of liver: Secondary | ICD-10-CM | POA: Diagnosis not present

## 2021-09-09 DIAGNOSIS — M19032 Primary osteoarthritis, left wrist: Secondary | ICD-10-CM | POA: Diagnosis not present

## 2021-09-09 DIAGNOSIS — C787 Secondary malignant neoplasm of liver and intrahepatic bile duct: Secondary | ICD-10-CM | POA: Diagnosis not present

## 2021-09-09 DIAGNOSIS — C7951 Secondary malignant neoplasm of bone: Secondary | ICD-10-CM | POA: Diagnosis not present

## 2021-09-09 DIAGNOSIS — M19071 Primary osteoarthritis, right ankle and foot: Secondary | ICD-10-CM | POA: Diagnosis not present

## 2021-09-09 LAB — POCT I-STAT CREATININE: Creatinine, Ser: 1 mg/dL (ref 0.44–1.00)

## 2021-09-09 MED ORDER — SODIUM CHLORIDE (PF) 0.9 % IJ SOLN
INTRAMUSCULAR | Status: AC
  Filled 2021-09-09: qty 50

## 2021-09-09 MED ORDER — TECHNETIUM TC 99M MEDRONATE IV KIT
20.0000 | PACK | Freq: Once | INTRAVENOUS | Status: AC | PRN
  Administered 2021-09-09: 21.9 via INTRAVENOUS

## 2021-09-09 MED ORDER — IOHEXOL 300 MG/ML  SOLN
100.0000 mL | Freq: Once | INTRAMUSCULAR | Status: AC | PRN
  Administered 2021-09-09: 100 mL via INTRAVENOUS

## 2021-09-11 ENCOUNTER — Other Ambulatory Visit: Payer: Self-pay | Admitting: Hematology

## 2021-09-11 ENCOUNTER — Other Ambulatory Visit: Payer: Self-pay | Admitting: Internal Medicine

## 2021-09-12 ENCOUNTER — Inpatient Hospital Stay: Payer: Medicare HMO | Attending: Hematology | Admitting: Hematology

## 2021-09-12 ENCOUNTER — Inpatient Hospital Stay: Payer: Medicare HMO

## 2021-09-12 ENCOUNTER — Other Ambulatory Visit: Payer: Self-pay

## 2021-09-12 ENCOUNTER — Encounter: Payer: Self-pay | Admitting: Hematology

## 2021-09-12 VITALS — BP 130/84 | HR 78 | Temp 98.2°F | Resp 18 | Ht 66.0 in | Wt 154.3 lb

## 2021-09-12 DIAGNOSIS — C7951 Secondary malignant neoplasm of bone: Secondary | ICD-10-CM | POA: Insufficient documentation

## 2021-09-12 DIAGNOSIS — F419 Anxiety disorder, unspecified: Secondary | ICD-10-CM | POA: Insufficient documentation

## 2021-09-12 DIAGNOSIS — Z79899 Other long term (current) drug therapy: Secondary | ICD-10-CM | POA: Insufficient documentation

## 2021-09-12 DIAGNOSIS — F32A Depression, unspecified: Secondary | ICD-10-CM | POA: Insufficient documentation

## 2021-09-12 DIAGNOSIS — Z170 Estrogen receptor positive status [ER+]: Secondary | ICD-10-CM | POA: Insufficient documentation

## 2021-09-12 DIAGNOSIS — Z5111 Encounter for antineoplastic chemotherapy: Secondary | ICD-10-CM | POA: Insufficient documentation

## 2021-09-12 DIAGNOSIS — C787 Secondary malignant neoplasm of liver and intrahepatic bile duct: Secondary | ICD-10-CM | POA: Diagnosis not present

## 2021-09-12 DIAGNOSIS — C50919 Malignant neoplasm of unspecified site of unspecified female breast: Secondary | ICD-10-CM

## 2021-09-12 DIAGNOSIS — R160 Hepatomegaly, not elsewhere classified: Secondary | ICD-10-CM

## 2021-09-12 DIAGNOSIS — C792 Secondary malignant neoplasm of skin: Secondary | ICD-10-CM | POA: Insufficient documentation

## 2021-09-12 DIAGNOSIS — C50312 Malignant neoplasm of lower-inner quadrant of left female breast: Secondary | ICD-10-CM | POA: Diagnosis not present

## 2021-09-12 DIAGNOSIS — K5090 Crohn's disease, unspecified, without complications: Secondary | ICD-10-CM | POA: Insufficient documentation

## 2021-09-12 LAB — CBC WITH DIFFERENTIAL (CANCER CENTER ONLY)
Abs Immature Granulocytes: 0.03 10*3/uL (ref 0.00–0.07)
Basophils Absolute: 0 10*3/uL (ref 0.0–0.1)
Basophils Relative: 0 %
Eosinophils Absolute: 0.1 10*3/uL (ref 0.0–0.5)
Eosinophils Relative: 1 %
HCT: 41.4 % (ref 36.0–46.0)
Hemoglobin: 14.4 g/dL (ref 12.0–15.0)
Immature Granulocytes: 0 %
Lymphocytes Relative: 17 %
Lymphs Abs: 1.2 10*3/uL (ref 0.7–4.0)
MCH: 31.8 pg (ref 26.0–34.0)
MCHC: 34.8 g/dL (ref 30.0–36.0)
MCV: 91.4 fL (ref 80.0–100.0)
Monocytes Absolute: 0.5 10*3/uL (ref 0.1–1.0)
Monocytes Relative: 7 %
Neutro Abs: 5.4 10*3/uL (ref 1.7–7.7)
Neutrophils Relative %: 75 %
Platelet Count: 222 10*3/uL (ref 150–400)
RBC: 4.53 MIL/uL (ref 3.87–5.11)
RDW: 12.3 % (ref 11.5–15.5)
WBC Count: 7.2 10*3/uL (ref 4.0–10.5)
nRBC: 0 % (ref 0.0–0.2)

## 2021-09-12 LAB — CMP (CANCER CENTER ONLY)
ALT: 7 U/L (ref 0–44)
AST: 13 U/L — ABNORMAL LOW (ref 15–41)
Albumin: 3.7 g/dL (ref 3.5–5.0)
Alkaline Phosphatase: 84 U/L (ref 38–126)
Anion gap: 8 (ref 5–15)
BUN: 11 mg/dL (ref 8–23)
CO2: 21 mmol/L — ABNORMAL LOW (ref 22–32)
Calcium: 8.7 mg/dL — ABNORMAL LOW (ref 8.9–10.3)
Chloride: 104 mmol/L (ref 98–111)
Creatinine: 1.02 mg/dL — ABNORMAL HIGH (ref 0.44–1.00)
GFR, Estimated: 56 mL/min — ABNORMAL LOW (ref >60)
Glucose, Bld: 113 mg/dL — ABNORMAL HIGH (ref 70–99)
Potassium: 3.7 mmol/L (ref 3.5–5.1)
Sodium: 133 mmol/L — ABNORMAL LOW (ref 135–145)
Total Bilirubin: 0.6 mg/dL (ref 0.3–1.2)
Total Protein: 6.8 g/dL (ref 6.5–8.1)

## 2021-09-12 MED ORDER — FULVESTRANT 250 MG/5ML IM SOSY
500.0000 mg | PREFILLED_SYRINGE | Freq: Once | INTRAMUSCULAR | Status: AC
  Administered 2021-09-12: 500 mg via INTRAMUSCULAR
  Filled 2021-09-12: qty 10

## 2021-09-12 NOTE — Progress Notes (Signed)
[place]   Telephone:(336) [phone] Fax:(336) [phone]   Clinic Follow up Note   Patient Care Team: Carollee Herter, Alferd Apa, DO as PCP - General [name], MD as Consulting Physician (Breast Surgery) Pyrtle, Lajuan Lines, MD as Consulting Physician (Gastroenterology) Druscilla Brownie, MD as Referring Physician (Dermatology) Claudia Desanctis Steffanie Dunn, MD as Consulting Physician (Plastic Surgery) Truitt Merle, MD as Consulting Physician (Hematology)  Date of Service:  09/12/2021  CHIEF COMPLAINT: f/u of metastatic breast cancer  CURRENT THERAPY:  Fulvestrant injections, starting 05/15/21, now q4weeks  ASSESSMENT & PLAN:  Sara Barnes is a 81 y.o. female with   1. 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 Barry Dienes. Oncotype RS of 4 (low risk). She received adjuvant radiation. She attempted letrozole previously but did not tolerate due to colitis (pt has Crohn's). -she developed a rash in early 2022. Excisional biopsy by Dr. Claudia Desanctis on 07/25/20 confirmed invasive carcinoma, felt to represent recurrence of breast cancer. She declined further work up with [place] PET scan and was subsequently lost to f/u. -she presented to the ED on 04/19/21 with nausea, vomiting, and diarrhea. She was diagnosed with C.diff colitis but was also found to have liver lesions on CT. Liver biopsy on 04/24/21 confirmed metastatic carcinoma, consistent with known mammary carcinoma. Her2 negative by FISH. -she was also noted to have bilateral pleural effusions on CT AP. Chest CT on 05/11/21: showed lower lobe compressive atelectasis, L>R; nonspecific right hilar lymphadenopathy; lytic bony lesions involving left 5th rib, left T5 transverse process, and left aspect of T10 vertebral body. Pleural fluid from thoracentesis on 05/13/21 showed only atypical mesothelial cells.  -Due to her colitis, she may not  tolerate CDK4/6 inhibitor well (due to potential diarrhea). After a lengthy discussion, she agreed to fulvestrant injections. She received her first dose on 05/15/21. She has tolerated well. -restaging CT CAP and bone scan on 09/09/21 showed: improving hepatic metastasis (size decreased by 40-50%); stable osseous metastasis. No new lesions. She has had excellent response. -she is clinically doing better. labs reviewed, overall stable. We will proceed with fifth dose today.    2. Anxiety, h/o depression -she is on 20 mg generic Prozac. She does not feel this is helping for her new anxiety surrounding her cancer. -I prescribed Xanax on 05/30/21 for her to use as needed. She is taking one tablet twice a day every day. I will refill today. -she previously declined referral to counseling.   3. Crohn's disease and IBS -prior history of severe colitis -managed by Dr. Hilarie Fredrickson     PLAN: -proceed with fulvestrant injection today and continue every 4 weeks -I refilled Xanax -lab, f/u in 8 weeks   No problem-specific Assessment & Plan notes found for this encounter.   SUMMARY OF ONCOLOGIC HISTORY: Oncology History Overview Note  Cancer of lower-inner quadrant of left female breast [facility])   Staging form: Breast, AJCC 7th Edition     Clinical stage from 12/06/2014: Stage IA (T1c, N0, M0) - Unsigned     Pathologic stage from 12/13/2014: Stage IA (T1c, N0, cM0) - Unsigned       Cancer of lower-inner quadrant of left female breast ([place])  11/28/2014 Breast US   Highly suspicious 1.6 cm irregular shadowing mass at the site of palpable concern in the subareolar lower inner quadrant of the left breast at approximately 630.    11/29/2014 Receptors  her2   Estrogen Receptor: 100%, POSITIVE, STRONG STAINING INTENSITY (PERFORMED MANUALLY) Progesterone Receptor: 95%, POSITIVE, STRONG STAINING INTENSITY (PERFORMED MANUALLY) Proliferation Marker Ki67: 10% (PERFORMED MANUALLY), Her2neu negative    11/29/2014 Initial  Biopsy   Breast, left, needle core biopsy, subareolar about 6:30 - INVASIVE DUCTAL CARCINOMA, SEE COMMENT.    11/30/2014 Initial Diagnosis   Cancer of lower-inner quadrant of left female breast    12/13/2014 Pathology Results   Left breast lumpectomy showed a 1.7 cm invasive ductal carcinoma, grade 1, margins were negative, (+) LVI, 5 Sentinel lymph nodes were negative,    12/13/2014 Oncotype testing   RS 4, which predicts 10-year risk of distant recurrence 4% with tamoxifen alone.    02/05/2015 - 02/26/2015 Radiation Therapy   left breast radiaiton     03/18/2015 -  Anti-estrogen oral therapy   She tried letrozole for 2-3 weeks, could not tolerate and stopped.    07/25/2020 Pathology Results   FINAL MICROSCOPIC DIAGNOSIS: A. SKIN, LEFT CHEST, EXCISION: - Invasive carcinoma involving dermis and subcutaneous soft tissue, consistent with patient's clinical history of invasive ductal carcinoma, see comment - Deep and lateral resection margins are negative for carcinoma  Appears grade I  ADDENDUM: PROGNOSTIC INDICATOR RESULTS: Immunohistochemical and morphometric analysis performed manually The tumor cells are NEGATIVE for Her2 (1+). Estrogen Receptor: POSITIVE, 90%, STRONG STAINING Progesterone Receptor: POSITIVE, 95%, STRONG STAINING Proliferation Marker Ki-67: 5%    04/19/2021 Imaging   EXAM: CT ABDOMEN AND PELVIS WITH CONTRAST  IMPRESSION: 1. Multiple hypodense hepatic masses, as detailed above, highly suspicious for metastatic disease. Differential include infectious/inflammatory process. Further evaluation with MRI examination with contrast would be helpful. 2. Moderate left and small right pleural effusion. 3. Postsurgical changes for prior hemicolectomy. No bowel obstruction, colitis or diverticulitis.   04/24/2021 Pathology Results   FINAL MICROSCOPIC DIAGNOSIS:   A. LIVER, BIOPSY:  -  Metastatic carcinoma  -  See comment   COMMENT:  By  immunohistochemistry, the neoplastic cells are positive for  cytokeratin 7, ER (100%, strong), GATA3 and PAX8 (patchy weak) but negative for cytokeratin 20 and TTF-1.  Overall, the morphology and immunophenotype supports metastasis from the patient's known mammary carcinoma.   ADDENDUM:  By immunohistochemistry, HER-2 is EQUIVOCAL (2+).    ADDENDUM:  FLOURESCENCE IN-SITU HYBRIDIZATION RESULTS:  GROUP 5:   HER2 **NEGATIVE**    05/11/2021 Imaging   EXAM: CT CHEST WITH CONTRAST  IMPRESSION: 1. Bilateral pleural effusions and lower lobe compressive atelectasis, left greater than right. 2. Nonspecific right hilar lymphadenopathy. 3. Bony lytic lesions involving the left posterior fifth rib, left T5 transverse process, and left aspect of the T10 vertebral body, consistent with bony metastases. 4. Patchy right upper lobe ground-glass airspace disease, which may be inflammatory or infectious. 5. Slight decrease in size of the left lobe subcapsular liver hematoma. No evidence of active hemorrhage. 6. Stable liver hypodensities consistent with presumed metastatic disease. Please correlate with previous biopsy results.   05/13/2021 Pathology Results   Specimen Submitted:  A. PLEURAL FLUID, LEFT, THORACENTESIS:   FINAL MICROSCOPIC DIAGNOSIS:  - Atypical mesothelial cells present  - Chronic inflammation       INTERVAL HISTORY:  Sara Barnes is here for a follow up of metastatic breast cancer. She was last seen by me on 07/11/21. She presents to the clinic accompanied by her sister. She reports she is doing well overall, no new symptoms. She notes she is not taking pain medication.   All other systems were reviewed with the  patient and are negative.  MEDICAL HISTORY:  Past Medical History:  Diagnosis Date   Allergy    Anemia    Anxiety    Arthritis    Basal cell carcinoma    Blood transfusion without reported diagnosis    Bowel perforation ([place])    Cancer of lower-inner quadrant  of female breast ([place]) 11/30/2014   left   Cancer of lower-inner quadrant of left female breast ([place]) 11/30/2014   Clostridium difficile infection    Complication of anesthesia    anxiety post-op after ear surgeries   Crohn's disease of large intestine with abscess ([place])    Dental crowns present    recent root canal 11/2014   Depression    Family history of adverse reaction to anesthesia    pt's sister has hx. 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   Seasonal allergies    TMJ (temporomandibular joint syndrome)    Ulcerative colitis    Vitamin B12 deficiency     SURGICAL HISTORY: Past Surgical History:  Procedure Laterality Date   ABDOMINAL HYSTERECTOMY     partial   APPENDECTOMY     AUGMENTATION MAMMAPLASTY Bilateral    BASAL CELL CARCINOMA EXCISION Left    nose   BREAST BIOPSY Left 2019   benign   BREAST ENHANCEMENT SURGERY     BREAST LUMPECTOMY Left 2016   BREAST LUMPECTOMY WITH RADIOACTIVE SEED AND SENTINEL LYMPH NODE BIOPSY Left 12/13/2014   Procedure: LEFT BREAST LUMPECTOMY WITH RADIOACTIVE SEED AND SENTINEL LYMPH NODE BIOPSY;  Surgeon: [name], MD;  Location: [place];  Service: General;  Laterality: Left;   COLONOSCOPY WITH PROPOFOL  05/28/2011; 06/28/2014   EXPLORATORY LAPAROTOMY  04/08/2018   with end ileostomy takedown and creation of loop ileostomy-- [facility] N/A 01/08/2016   Procedure: Beryle Quant;  Surgeon: [name], MD;  Location: [facility];  Service: Gastroenterology;  Laterality: N/A;  needs MAC due to pain and anxiety   ILEOSTOMY N/A 05/25/2017    Procedure: DILATION OF ILEOSTOMY;  Surgeon: Rolm Bookbinder, MD;  Location: [facility];  Service: General;  Laterality: N/A;   ILEOSTOMY  02/11/2016   END ileostomy.  Dr Hassell Done   ILEOSTOMY CLOSURE N/A 06/02/2017   Procedure: RESECTION OF ILEAL STRICTURE; ILEOSTOMY REVISION ;  Surgeon: Michael Boston, MD;  Location: [facility];  Service: General;  Laterality: N/A;   ILEOSTOMY DILATION  05/25/2017   DILITATION OF ILEAL STRICTURE   ileostomy stricture resection  06/02/2017   with revision   INCISION AND DRAINAGE ABSCESS  2018   INCISION AND DRAINAGE PERIRECTAL ABSCESS N/A 09/28/2013   Procedure: IRRIGATION AND DEBRIDEMENT PERIANAL ABSCESS, proctoscopy;  Surgeon: Shann Medal, MD;  Location: [facility];  Service: General;  Laterality: N/A;   INCISION AND DRAINAGE PERIRECTAL ABSCESS N/A 01/22/2016   Procedure: IRRIGATION AND DEBRIDEMENT PERIRECTAL ABSCESS;  Surgeon: Armandina Gemma, MD;  Location: [facility];  Service: General;  Laterality: N/A;   INNER EAR SURGERY Bilateral    LAPAROSCOPIC DIVERTED COLOSTOMY N/A 02/11/2016   Procedure: LAPAROSCOPIC DIVERTED ILEOSTOMY;  Surgeon: Johnathan Hausen,  MD;  Location: [facility];  Service: General;  Laterality: N/A;   LAPAROSCOPY N/A 03/23/2017   Procedure: LAPAROSCOPY DIAGNOSTIC, EXPLORATORY LAPAROTOMY,  BOWEL RESECTION;  Surgeon: Jackolyn Confer, MD;  Location: [facility];  Service: General;  Laterality: N/A;    I have reviewed the social history and family history with the patient and they are unchanged from previous note.  ALLERGIES:  is allergic to nsaids, codeine, mesalamine, oxycodone, lactose intolerance (gi), norco [hydrocodone-acetaminophen], other, and sulfa antibiotics.  MEDICATIONS:  Current Outpatient Medications  Medication Sig Dispense Refill   acetaminophen (TYLENOL) 500 MG tablet You can take 2 tablets every 6 hours as needed for pain.  Use this as your primary pain control.  Use Tramadol as a last resort.  You can buy this over the counter at any drug store.     DO NOT TAKE MORE THAN 4000 MG OF TYLENOL PER DAY.  IT CAN HARM YOUR LIVER. 30 tablet 0   ALPRAZolam (XANAX) 0.25 MG tablet TAKE 1 TO 2 TABLETS(0.25 TO 0.5 MG) BY MOUTH TWICE DAILY AS NEEDED FOR ANXIETY 60 tablet 0   cyanocobalamin (,VITAMIN B-12,) 1000 MCG/ML injection As directed (Patient taking differently: 1,000 mcg every 30 (thirty) days. First Sunday of each month) 3 mL 11   diphenoxylate-atropine (LOMOTIL) 2.5-0.025 MG tablet Take 1-2 tablets by mouth 3-4 times daily AS NEEDED for severe diarrhea 240 tablet 6   famotidine (PEPCID) 20 MG tablet Take 1 tablet (20 mg total) by mouth 2 (two) times daily. (Patient taking differently: Take 20 mg by mouth daily as needed for indigestion.) 180 tablet 6   FLUoxetine (PROZAC) 40 MG capsule Take 1 capsule (40 mg total) by mouth daily. Pharmacy- please d/c rx for prozac 20 mg daily. thx 90 capsule 1   fluticasone (FLONASE) 50 MCG/ACT nasal spray Place 2 sprays into both nostrils daily. (Patient taking differently: Place 2 sprays into both nostrils daily as needed for rhinitis.) 16 g 6   loperamide (IMODIUM) 2 MG capsule Take 1 capsule (2 mg total) by mouth as needed for diarrhea or loose stools. 90 capsule 1   ondansetron (ZOFRAN-ODT) 4 MG disintegrating tablet DISSOLVE ONE TABLET BY MOUTH EVERY 6 HOURS AS NEEDED FOR NAUSEA AND VOMITING 90 tablet 6   predniSONE (DELTASONE) 5 MG tablet TAKE 1 TABLET(5 MG) BY MOUTH DAILY 90 tablet 0   zolpidem (AMBIEN) 10 MG tablet Take 1 tablet (10 mg total) by mouth at bedtime as needed for sleep. 90 tablet 0   No current facility-administered medications for this visit.    PHYSICAL EXAMINATION: ECOG PERFORMANCE STATUS: 2 - Symptomatic, <50% confined to bed  Vitals:   09/12/21 1315  BP: 130/84  Pulse: 78  Resp: 18  Temp: 98.2 F (36.8 C)  SpO2: 99%   Wt Readings from Last 3 Encounters:  09/12/21 154 lb 4.8 oz (70 kg)  07/11/21 153 lb (69.4 kg)  07/02/21 152 lb (68.9 kg)     GENERAL:alert, no distress  and comfortable SKIN: skin color normal, no rashes or significant lesions EYES: normal, Conjunctiva are pink and non-injected, sclera clear  NEURO: alert & oriented x 3 with fluent speech  LABORATORY DATA:  I have reviewed the data as listed    Latest Ref Rng & Units 09/12/2021   12:38 PM 07/11/2021   12:15 PM 06/14/2021   10:50 AM  CBC  WBC 4.0 - 10.5 K/uL 7.2   9.3   9.2    Hemoglobin 12.0 - 15.0 g/dL 14.4  14.3   13.9    Hematocrit 36.0 - 46.0 % 41.4   42.6   41.4    Platelets 150 - 400 K/uL 222   270   271          Latest Ref Rng & Units 09/12/2021   12:38 PM 09/09/2021    9:36 AM 07/11/2021   12:15 PM  CMP  Glucose 70 - 99 mg/dL 113    101    BUN 8 - 23 mg/dL 11    12    Creatinine 0.44 - 1.00 mg/dL 1.02   1.00   1.04    Sodium 135 - 145 mmol/L 133    134    Potassium 3.5 - 5.1 mmol/L 3.7    3.9    Chloride 98 - 111 mmol/L 104    104    CO2 22 - 32 mmol/L 21    23    Calcium 8.9 - 10.3 mg/dL 8.7    9.1    Total Protein 6.5 - 8.1 g/dL 6.8    7.0    Total Bilirubin 0.3 - 1.2 mg/dL 0.6    0.5    Alkaline Phos 38 - 126 U/L 84    96    AST 15 - 41 U/L 13    12    ALT 0 - 44 U/L 7    8        RADIOGRAPHIC STUDIES: I have personally reviewed the radiological images as listed and agreed with the findings in the report. No results found.    No orders of the defined types were placed in this encounter.  All questions were answered. The patient knows to call the clinic with any problems, questions or concerns. No barriers to learning was detected. The total time spent in the appointment was 30 minutes.     Truitt Merle, MD 09/12/2021   I, Wilburn Mylar, am acting as scribe for Truitt Merle, MD.   I have reviewed the above documentation for accuracy and completeness, and I agree with the above.

## 2021-09-13 LAB — CANCER ANTIGEN 27.29: CA 27.29: 119.7 U/mL — ABNORMAL HIGH (ref 0.0–38.6)

## 2021-10-10 ENCOUNTER — Other Ambulatory Visit: Payer: Self-pay

## 2021-10-10 ENCOUNTER — Other Ambulatory Visit: Payer: Self-pay | Admitting: Hematology

## 2021-10-10 ENCOUNTER — Inpatient Hospital Stay: Payer: Medicare HMO | Attending: Hematology

## 2021-10-10 VITALS — BP 131/75 | HR 80 | Temp 98.7°F | Resp 18

## 2021-10-10 DIAGNOSIS — C50312 Malignant neoplasm of lower-inner quadrant of left female breast: Secondary | ICD-10-CM | POA: Diagnosis not present

## 2021-10-10 DIAGNOSIS — C792 Secondary malignant neoplasm of skin: Secondary | ICD-10-CM | POA: Insufficient documentation

## 2021-10-10 DIAGNOSIS — R160 Hepatomegaly, not elsewhere classified: Secondary | ICD-10-CM

## 2021-10-10 DIAGNOSIS — Z170 Estrogen receptor positive status [ER+]: Secondary | ICD-10-CM | POA: Diagnosis not present

## 2021-10-10 DIAGNOSIS — C7951 Secondary malignant neoplasm of bone: Secondary | ICD-10-CM | POA: Insufficient documentation

## 2021-10-10 DIAGNOSIS — Z5111 Encounter for antineoplastic chemotherapy: Secondary | ICD-10-CM | POA: Diagnosis not present

## 2021-10-10 DIAGNOSIS — C787 Secondary malignant neoplasm of liver and intrahepatic bile duct: Secondary | ICD-10-CM | POA: Insufficient documentation

## 2021-10-10 DIAGNOSIS — C50919 Malignant neoplasm of unspecified site of unspecified female breast: Secondary | ICD-10-CM

## 2021-10-10 MED ORDER — ALPRAZOLAM 0.25 MG PO TABS: ORAL_TABLET | ORAL | 0 refills | Status: DC

## 2021-10-10 MED ORDER — FULVESTRANT 250 MG/5ML IM SOSY
500.0000 mg | PREFILLED_SYRINGE | Freq: Once | INTRAMUSCULAR | Status: AC
  Administered 2021-10-10: 500 mg via INTRAMUSCULAR
  Filled 2021-10-10: qty 10

## 2021-10-14 ENCOUNTER — Telehealth: Payer: Self-pay

## 2021-10-14 MED ORDER — ZOLPIDEM TARTRATE 10 MG PO TABS: 10.0000 mg | ORAL_TABLET | Freq: Every evening | ORAL | 0 refills | Status: DC | PRN

## 2021-10-14 NOTE — Telephone Encounter (Signed)
We received a request for Sara Barnes's generic Ambien. Since this is a controlled substance request will forward to Dr of Day as Dr Zenovia Jarred  is out this week. In patient's last office visit note from 07/02/21 he said she would continue the zolpidem. This is to go to [facility] on [address]. Thank you Sir for your time.

## 2021-10-14 NOTE — Telephone Encounter (Signed)
Patient was last seen on 07/02/2021 by Dr. Hilarie Fredrickson.  Note reviewed.  Insomnia was being treated with Ambien 10 mg nightly.  As this is being managed by Dr. Hilarie Fredrickson, in his absence, I will place refill today.  Ambien 10 mg nightly as needed for sleep.  #90.  RF 0

## 2021-10-14 NOTE — Addendum Note (Signed)
Addended by: Berniece Salines A on: 10/14/2021 02:42 PM   Modules accepted: Orders

## 2021-11-07 ENCOUNTER — Inpatient Hospital Stay: Payer: Medicare HMO | Admitting: Hematology

## 2021-11-07 ENCOUNTER — Other Ambulatory Visit: Payer: Self-pay

## 2021-11-07 ENCOUNTER — Inpatient Hospital Stay: Payer: Medicare HMO

## 2021-11-07 ENCOUNTER — Inpatient Hospital Stay: Payer: Medicare HMO | Attending: Hematology

## 2021-11-07 ENCOUNTER — Encounter: Payer: Self-pay | Admitting: Hematology

## 2021-11-07 VITALS — BP 129/84 | HR 72 | Temp 98.3°F | Resp 18 | Ht 66.0 in | Wt 155.7 lb

## 2021-11-07 DIAGNOSIS — Z5111 Encounter for antineoplastic chemotherapy: Secondary | ICD-10-CM | POA: Diagnosis not present

## 2021-11-07 DIAGNOSIS — C50312 Malignant neoplasm of lower-inner quadrant of left female breast: Secondary | ICD-10-CM | POA: Diagnosis not present

## 2021-11-07 DIAGNOSIS — C787 Secondary malignant neoplasm of liver and intrahepatic bile duct: Secondary | ICD-10-CM | POA: Insufficient documentation

## 2021-11-07 DIAGNOSIS — Z170 Estrogen receptor positive status [ER+]: Secondary | ICD-10-CM | POA: Insufficient documentation

## 2021-11-07 DIAGNOSIS — C50919 Malignant neoplasm of unspecified site of unspecified female breast: Secondary | ICD-10-CM

## 2021-11-07 DIAGNOSIS — C7951 Secondary malignant neoplasm of bone: Secondary | ICD-10-CM | POA: Insufficient documentation

## 2021-11-07 DIAGNOSIS — C792 Secondary malignant neoplasm of skin: Secondary | ICD-10-CM

## 2021-11-07 DIAGNOSIS — R160 Hepatomegaly, not elsewhere classified: Secondary | ICD-10-CM

## 2021-11-07 LAB — CMP (CANCER CENTER ONLY)
ALT: 7 U/L (ref 0–44)
AST: 13 U/L — ABNORMAL LOW (ref 15–41)
Albumin: 4.1 g/dL (ref 3.5–5.0)
Alkaline Phosphatase: 85 U/L (ref 38–126)
Anion gap: 6 (ref 5–15)
BUN: 16 mg/dL (ref 8–23)
CO2: 25 mmol/L (ref 22–32)
Calcium: 9.4 mg/dL (ref 8.9–10.3)
Chloride: 103 mmol/L (ref 98–111)
Creatinine: 0.99 mg/dL (ref 0.44–1.00)
GFR, Estimated: 58 mL/min — ABNORMAL LOW (ref >60)
Glucose, Bld: 100 mg/dL — ABNORMAL HIGH (ref 70–99)
Potassium: 4.2 mmol/L (ref 3.5–5.1)
Sodium: 134 mmol/L — ABNORMAL LOW (ref 135–145)
Total Bilirubin: 0.6 mg/dL (ref 0.3–1.2)
Total Protein: 7.1 g/dL (ref 6.5–8.1)

## 2021-11-07 LAB — CBC WITH DIFFERENTIAL (CANCER CENTER ONLY)
Abs Immature Granulocytes: 0.04 10*3/uL (ref 0.00–0.07)
Basophils Absolute: 0 10*3/uL (ref 0.0–0.1)
Basophils Relative: 0 %
Eosinophils Absolute: 0 10*3/uL (ref 0.0–0.5)
Eosinophils Relative: 0 %
HCT: 42.3 % (ref 36.0–46.0)
Hemoglobin: 14.5 g/dL (ref 12.0–15.0)
Immature Granulocytes: 1 %
Lymphocytes Relative: 14 %
Lymphs Abs: 1.1 10*3/uL (ref 0.7–4.0)
MCH: 32.3 pg (ref 26.0–34.0)
MCHC: 34.3 g/dL (ref 30.0–36.0)
MCV: 94.2 fL (ref 80.0–100.0)
Monocytes Absolute: 0.4 10*3/uL (ref 0.1–1.0)
Monocytes Relative: 5 %
Neutro Abs: 6.8 10*3/uL (ref 1.7–7.7)
Neutrophils Relative %: 80 %
Platelet Count: 203 10*3/uL (ref 150–400)
RBC: 4.49 MIL/uL (ref 3.87–5.11)
RDW: 12.3 % (ref 11.5–15.5)
WBC Count: 8.4 10*3/uL (ref 4.0–10.5)
nRBC: 0 % (ref 0.0–0.2)

## 2021-11-07 MED ORDER — FULVESTRANT 250 MG/5ML IM SOSY
500.0000 mg | PREFILLED_SYRINGE | Freq: Once | INTRAMUSCULAR | Status: AC
  Administered 2021-11-07: 500 mg via INTRAMUSCULAR
  Filled 2021-11-07: qty 10

## 2021-11-07 MED ORDER — ALPRAZOLAM 0.25 MG PO TABS: ORAL_TABLET | ORAL | 0 refills | Status: DC

## 2021-11-07 NOTE — Patient Instructions (Signed)
Fulvestrant injection What is this medication? FULVESTRANT (ful VES trant) blocks the effects of estrogen. It is used to treat breast cancer. This medicine may be used for other purposes; ask your health care provider or pharmacist if you have questions. COMMON BRAND NAME(S): FASLODEX What should I tell my care team before I take this medication? They need to know if you have any of these conditions: bleeding disorders liver disease low blood counts, like low white cell, platelet, or red cell counts an unusual or allergic reaction to fulvestrant, other medicines, foods, dyes, or preservatives pregnant or trying to get pregnant breast-feeding How should I use this medication? This medicine is for injection into a muscle. It is usually given by a health care professional in a hospital or clinic setting. Talk to your pediatrician regarding the use of this medicine in children. Special care may be needed. Overdosage: If you think you have taken too much of this medicine contact a poison control center or emergency room at once. NOTE: This medicine is only for you. Do not share this medicine with others. What if I miss a dose? It is important not to miss your dose. Call your doctor or health care professional if you are unable to keep an appointment. What may interact with this medication? medicines that treat or prevent blood clots like warfarin, enoxaparin, dalteparin, apixaban, dabigatran, and rivaroxaban This list may not describe all possible interactions. Give your health care provider a list of all the medicines, herbs, non-prescription drugs, or dietary supplements you use. Also tell them if you smoke, drink alcohol, or use illegal drugs. Some items may interact with your medicine. What should I watch for while using this medication? Your condition will be monitored carefully while you are receiving this medicine. You will need important blood work done while you are taking this  medicine. Do not become pregnant while taking this medicine or for at least 1 year after stopping it. Women of child-bearing potential will need to have a negative pregnancy test before starting this medicine. Women should inform their doctor if they wish to become pregnant or think they might be pregnant. There is a potential for serious side effects to an unborn child. Men should inform their doctors if they wish to father a child. This medicine may lower sperm counts. Talk to your health care professional or pharmacist for more information. Do not breast-feed an infant while taking this medicine or for 1 year after the last dose. What side effects may I notice from receiving this medication? Side effects that you should report to your doctor or health care professional as soon as possible: allergic reactions like skin rash, itching or hives, swelling of the face, lips, or tongue feeling faint or lightheaded, falls pain, tingling, numbness, or weakness in the legs signs and symptoms of infection like fever or chills; cough; flu-like symptoms; sore throat vaginal bleeding Side effects that usually do not require medical attention (report to your doctor or health care professional if they continue or are bothersome): aches, pains constipation diarrhea headache hot flashes nausea, vomiting pain at site where injected stomach pain This list may not describe all possible side effects. Call your doctor for medical advice about side effects. You may report side effects to FDA at 1-800-FDA-1088. Where should I keep my medication? This drug is given in a hospital or clinic and will not be stored at home. NOTE: This sheet is a summary. It may not cover all possible information. If you have  questions about this medicine, talk to your doctor, pharmacist, or health care provider.  2023 Elsevier/Gold Standard (2017-07-28 00:00:00)

## 2021-11-07 NOTE — Progress Notes (Signed)
[place]   Telephone:([phone]) [phone] Fax:(336) [phone]   Clinic Follow up Note   Patient Care Team: Carollee Herter, Alferd Apa, DO as PCP - General [name], MD as Consulting Physician (Breast Surgery) Pyrtle, Lajuan Lines, MD as Consulting Physician (Gastroenterology) Druscilla Brownie, MD as Referring Physician (Dermatology) Claudia Desanctis Steffanie Dunn, MD as Consulting Physician (Plastic Surgery) Truitt Merle, MD as Consulting Physician (Hematology)  Date of Service:  11/07/2021  CHIEF COMPLAINT: f/u of metastatic breast cancer  CURRENT THERAPY:  Fulvestrant injections, starting 05/15/21, now q4weeks  ASSESSMENT & PLAN:  Sara Barnes is a 81 y.o. female with   1. 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 Barry Dienes. Oncotype RS of 4 (low risk). She received adjuvant radiation. She attempted letrozole previously but did not tolerate due to colitis (pt has Crohn's). -she developed a rash in early 2022. Excisional biopsy by Dr. Claudia Desanctis on 07/25/20 confirmed invasive carcinoma, felt to represent recurrence of breast cancer. She declined further work up with [place] PET scan and was subsequently lost to f/u. -she presented to the ED on 04/19/21 with nausea, vomiting, and diarrhea. She was diagnosed with C.diff colitis but was also found to have liver lesions on CT. Liver biopsy on 04/24/21 confirmed metastatic carcinoma, consistent with known mammary carcinoma. Her2 negative by FISH. -she was also noted to have bilateral pleural effusions on CT AP. Chest CT on 05/11/21: showed lower lobe compressive atelectasis, L>R; nonspecific right hilar lymphadenopathy; lytic bony lesions involving left 5th rib, left T5 transverse process, and left aspect of T10 vertebral body. Pleural fluid from thoracentesis on 05/13/21 showed only atypical mesothelial cells.  -Due to her colitis, she may not  tolerate CDK4/6 inhibitor well (due to potential diarrhea). After a lengthy discussion, she agreed to fulvestrant injections. She received her first dose on 05/15/21. She has tolerated well. -restaging CT CAP and bone scan on 09/09/21 showed: improving hepatic metastasis (size decreased by 40-50%); stable osseous metastasis. No new lesions. She has had excellent response. -she is clinically doing better. labs reviewed, overall stable. We will proceed with fulvestrant injection and continue every 4 weeks. -Patient has questions about the duration of her treatment, again reviewed metastatic breast cancer is not curable at this stage, and will continue fulvestrant injection until disease progression.   2. Anxiety, h/o depression -she is on 20 mg generic Prozac. She does not feel this is helping for her new anxiety surrounding her cancer. -I prescribed Xanax on 05/30/21 for her to use as needed. She is taking one tablet twice a day every day. I will refill today. -she previously declined referral to counseling.   3. Crohn's disease and IBS -prior history of severe colitis -managed by Dr. Hilarie Fredrickson     PLAN: -proceed with fulvestrant injection today and continue every 4 weeks -I refilled Xanax -lab, f/u in 8 weeks, plan to repeat CT and bone scan in 4 months.   No problem-specific Assessment & Plan notes found for this encounter.   SUMMARY OF ONCOLOGIC HISTORY: Oncology History Overview Note  Cancer of lower-inner quadrant of left female breast [facility])   Staging form: Breast, AJCC 7th Edition     Clinical stage from 12/06/2014: Stage IA (T1c, N0, M0) - Unsigned     Pathologic stage from 12/13/2014: Stage IA (T1c, N0, cM0) - Unsigned      Cancer of lower-inner quadrant of left  female breast ([place])  11/28/2014 Breast US   Highly suspicious 1.6 cm irregular shadowing mass at the site of palpable concern in the subareolar lower inner quadrant of the left breast at approximately 630.   11/29/2014  Receptors her2   Estrogen Receptor: 100%, POSITIVE, STRONG STAINING INTENSITY (PERFORMED MANUALLY) Progesterone Receptor: 95%, POSITIVE, STRONG STAINING INTENSITY (PERFORMED MANUALLY) Proliferation Marker Ki67: 10% (PERFORMED MANUALLY), Her2neu negative   11/29/2014 Initial Biopsy   Breast, left, needle core biopsy, subareolar about 6:30 - INVASIVE DUCTAL CARCINOMA, SEE COMMENT.   11/30/2014 Initial Diagnosis   Cancer of lower-inner quadrant of left female breast   12/13/2014 Pathology Results   Left breast lumpectomy showed a 1.7 cm invasive ductal carcinoma, grade 1, margins were negative, (+) LVI, 5 Sentinel lymph nodes were negative,   12/13/2014 Oncotype testing   RS 4, which predicts 10-year risk of distant recurrence 4% with tamoxifen alone.   02/05/2015 - 02/26/2015 Radiation Therapy   left breast radiaiton    03/18/2015 -  Anti-estrogen oral therapy   She tried letrozole for 2-3 weeks, could not tolerate and stopped.   07/25/2020 Pathology Results   FINAL MICROSCOPIC DIAGNOSIS: A. SKIN, LEFT CHEST, EXCISION: - Invasive carcinoma involving dermis and subcutaneous soft tissue, consistent with patient's clinical history of invasive ductal carcinoma, see comment - Deep and lateral resection margins are negative for carcinoma  Appears grade I  ADDENDUM: PROGNOSTIC INDICATOR RESULTS: Immunohistochemical and morphometric analysis performed manually The tumor cells are NEGATIVE for Her2 (1+). Estrogen Receptor: POSITIVE, 90%, STRONG STAINING Progesterone Receptor: POSITIVE, 95%, STRONG STAINING Proliferation Marker Ki-67: 5%    04/19/2021 Imaging   EXAM: CT ABDOMEN AND PELVIS WITH CONTRAST  IMPRESSION: 1. Multiple hypodense hepatic masses, as detailed above, highly suspicious for metastatic disease. Differential include infectious/inflammatory process. Further evaluation with MRI examination with contrast would be helpful. 2. Moderate left and small right pleural  effusion. 3. Postsurgical changes for prior hemicolectomy. No bowel obstruction, colitis or diverticulitis.   04/24/2021 Pathology Results   FINAL MICROSCOPIC DIAGNOSIS:   A. LIVER, BIOPSY:  -  Metastatic carcinoma  -  See comment   COMMENT:  By immunohistochemistry, the neoplastic cells are positive for  cytokeratin 7, ER (100%, strong), GATA3 and PAX8 (patchy weak) but negative for cytokeratin 20 and TTF-1.  Overall, the morphology and immunophenotype supports metastasis from the patient's known mammary carcinoma.   ADDENDUM:  By immunohistochemistry, HER-2 is EQUIVOCAL (2+).    ADDENDUM:  FLOURESCENCE IN-SITU HYBRIDIZATION RESULTS:  GROUP 5:   HER2 **NEGATIVE**    05/11/2021 Imaging   EXAM: CT CHEST WITH CONTRAST  IMPRESSION: 1. Bilateral pleural effusions and lower lobe compressive atelectasis, left greater than right. 2. Nonspecific right hilar lymphadenopathy. 3. Bony lytic lesions involving the left posterior fifth rib, left T5 transverse process, and left aspect of the T10 vertebral body, consistent with bony metastases. 4. Patchy right upper lobe ground-glass airspace disease, which may be inflammatory or infectious. 5. Slight decrease in size of the left lobe subcapsular liver hematoma. No evidence of active hemorrhage. 6. Stable liver hypodensities consistent with presumed metastatic disease. Please correlate with previous biopsy results.   05/13/2021 Pathology Results   Specimen Submitted:  A. PLEURAL FLUID, LEFT, THORACENTESIS:   FINAL MICROSCOPIC DIAGNOSIS:  - Atypical mesothelial cells present  - Chronic inflammation       INTERVAL HISTORY:  SOFYA MOUSTAFA is here for a follow up of metastatic breast cancer. She was last seen by me two months ago.  She presents  to the clinic with her sister today.  She is doing well overall. She has night hot flushes and wakes up at night, but she is still able to get enough sleep. No join pain or other new pain. Her  appetite is decent.  Her weight is stable.  She lives by herself, able to function at home.  Her sister lives very close to her, and help her out at home.  All other systems were reviewed with the patient and are negative.  MEDICAL HISTORY:  Past Medical History:  Diagnosis Date   Allergy    Anemia    Anxiety    Arthritis    Basal cell carcinoma    Blood transfusion without reported diagnosis    Bowel perforation ([place])    Cancer of lower-inner quadrant of female breast ([place]) 11/30/2014   left   Cancer of lower-inner quadrant of left female breast ([place]) 11/30/2014   Clostridium difficile infection    Complication of anesthesia    anxiety post-op after ear surgeries   Crohn's disease of large intestine with abscess ([place])    Dental crowns present    recent root canal 11/2014   Depression    Family history of adverse reaction to anesthesia    pt's sister has hx. 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   Seasonal allergies    TMJ (temporomandibular joint syndrome)    Ulcerative colitis    Vitamin B12 deficiency     SURGICAL HISTORY: Past Surgical History:  Procedure Laterality Date   ABDOMINAL HYSTERECTOMY     partial   APPENDECTOMY     AUGMENTATION MAMMAPLASTY Bilateral    BASAL CELL CARCINOMA EXCISION Left    nose   BREAST BIOPSY Left 2019   benign   BREAST ENHANCEMENT SURGERY     BREAST LUMPECTOMY Left 2016   BREAST LUMPECTOMY WITH RADIOACTIVE SEED AND SENTINEL LYMPH NODE BIOPSY Left 12/13/2014   Procedure: LEFT BREAST LUMPECTOMY WITH RADIOACTIVE SEED AND SENTINEL LYMPH NODE BIOPSY;  Surgeon: [name], MD;  Location: [facility];  Service: General;   Laterality: Left;   COLONOSCOPY WITH PROPOFOL  05/28/2011; 06/28/2014   EXPLORATORY LAPAROTOMY  04/08/2018   with end ileostomy takedown and creation of loop ileostomy-- [facility] [place] N/A 01/08/2016   Procedure: Beryle Quant;  Surgeon: [name], MD;  Location: [facility];  Service: Gastroenterology;  Laterality: N/A;  needs MAC due to pain and anxiety   ILEOSTOMY N/A 05/25/2017   Procedure: DILATION OF ILEOSTOMY;  Surgeon: Rolm Bookbinder, MD;  Location: [facility];  Service: General;  Laterality: N/A;   ILEOSTOMY  02/11/2016   END ileostomy.  Dr Hassell Done   ILEOSTOMY CLOSURE N/A 06/02/2017   Procedure: RESECTION OF ILEAL STRICTURE; ILEOSTOMY REVISION ;  Surgeon: Michael Boston, MD;  Location: [facility];  Service: General;  Laterality: N/A;   ILEOSTOMY DILATION  05/25/2017   DILITATION OF ILEAL STRICTURE   ileostomy stricture resection  06/02/2017   with revision   INCISION AND DRAINAGE ABSCESS  2018   INCISION AND DRAINAGE PERIRECTAL ABSCESS N/A 09/28/2013  Procedure: IRRIGATION AND DEBRIDEMENT PERIANAL ABSCESS, proctoscopy;  Surgeon: Shann Medal, MD;  Location: [facility];  Service: General;  Laterality: N/A;   INCISION AND DRAINAGE PERIRECTAL ABSCESS N/A 01/22/2016   Procedure: IRRIGATION AND DEBRIDEMENT PERIRECTAL ABSCESS;  Surgeon: Armandina Gemma, MD;  Location: [facility];  Service: General;  Laterality: N/A;   INNER EAR SURGERY Bilateral    LAPAROSCOPIC DIVERTED COLOSTOMY N/A 02/11/2016   Procedure: LAPAROSCOPIC DIVERTED ILEOSTOMY;  Surgeon: Johnathan Hausen, MD;  Location: [facility];  Service: General;  Laterality: N/A;   LAPAROSCOPY N/A 03/23/2017   Procedure: LAPAROSCOPY DIAGNOSTIC, EXPLORATORY LAPAROTOMY,  BOWEL RESECTION;  Surgeon: Jackolyn Confer, MD;  Location: [facility];  Service: General;  Laterality: N/A;    I have reviewed the social history and family history with the patient and they are unchanged from previous  note.  ALLERGIES:  is allergic to nsaids, codeine, mesalamine, oxycodone, lactose intolerance (gi), norco [hydrocodone-acetaminophen], other, and sulfa antibiotics.  MEDICATIONS:  Current Outpatient Medications  Medication Sig Dispense Refill   acetaminophen (TYLENOL) 500 MG tablet You can take 2 tablets every 6 hours as needed for pain.  Use this as your primary pain control.  Use Tramadol as a last resort.  You can buy this over the counter at any drug store.    DO NOT TAKE MORE THAN 4000 MG OF TYLENOL PER DAY.  IT CAN HARM YOUR LIVER. 30 tablet 0   ALPRAZolam (XANAX) 0.25 MG tablet TAKE 1 TO 2 TABLETS(0.25 TO 0.5 MG) BY MOUTH TWICE DAILY AS NEEDED FOR ANXIETY 60 tablet 0   cyanocobalamin (,VITAMIN B-12,) 1000 MCG/ML injection As directed (Patient taking differently: 1,000 mcg every 30 (thirty) days. First Sunday of each month) 3 mL 11   diphenoxylate-atropine (LOMOTIL) 2.5-0.025 MG tablet Take 1-2 tablets by mouth 3-4 times daily AS NEEDED for severe diarrhea 240 tablet 6   famotidine (PEPCID) 20 MG tablet Take 1 tablet (20 mg total) by mouth 2 (two) times daily. (Patient taking differently: Take 20 mg by mouth daily as needed for indigestion.) 180 tablet 6   FLUoxetine (PROZAC) 40 MG capsule Take 1 capsule (40 mg total) by mouth daily. Pharmacy- please d/c rx for prozac 20 mg daily. thx 90 capsule 1   fluticasone (FLONASE) 50 MCG/ACT nasal spray Place 2 sprays into both nostrils daily. (Patient taking differently: Place 2 sprays into both nostrils daily as needed for rhinitis.) 16 g 6   loperamide (IMODIUM) 2 MG capsule Take 1 capsule (2 mg total) by mouth as needed for diarrhea or loose stools. 90 capsule 1   ondansetron (ZOFRAN-ODT) 4 MG disintegrating tablet DISSOLVE ONE TABLET BY MOUTH EVERY 6 HOURS AS NEEDED FOR NAUSEA AND VOMITING 90 tablet 6   predniSONE (DELTASONE) 5 MG tablet TAKE 1 TABLET(5 MG) BY MOUTH DAILY 90 tablet 0   zolpidem (AMBIEN) 10 MG tablet Take 1 tablet (10 mg total) by  mouth at bedtime as needed for sleep. 90 tablet 0   No current facility-administered medications for this visit.    PHYSICAL EXAMINATION: ECOG PERFORMANCE STATUS: 2 - Symptomatic, <50% confined to bed  Vitals:   11/07/21 1156  BP: 129/84  Pulse: 72  Resp: 18  Temp: 98.3 F (36.8 C)  SpO2: 98%    Wt Readings from Last 3 Encounters:  11/07/21 155 lb 11.2 oz (70.6 kg)  09/12/21 154 lb 4.8 oz (70 kg)  07/11/21 153 lb (69.4 kg)     GENERAL:alert, no distress and comfortable SKIN: skin color normal, no rashes  or significant lesions EYES: normal, Conjunctiva are pink and non-injected, sclera clear  Bilateral lung clear, no rales or wheezing Heart rhythm regular, no leg edema Abdomen soft, nontender. NEURO: alert & oriented x 3 with fluent speech  LABORATORY DATA:  I have reviewed the data as listed    Latest Ref Rng & Units 11/07/2021    1:19 PM 09/12/2021   12:38 PM 07/11/2021   12:15 PM  CBC  WBC 4.0 - 10.5 K/uL 8.4  7.2  9.3   Hemoglobin 12.0 - 15.0 g/dL 14.5  14.4  14.3   Hematocrit 36.0 - 46.0 % 42.3  41.4  42.6   Platelets 150 - 400 K/uL 203  222  270         Latest Ref Rng & Units 11/07/2021    1:19 PM 09/12/2021   12:38 PM 09/09/2021    9:36 AM  CMP  Glucose 70 - 99 mg/dL 100  113    BUN 8 - 23 mg/dL 16  11    Creatinine 0.44 - 1.00 mg/dL 0.99  1.02  1.00   Sodium 135 - 145 mmol/L 134  133    Potassium 3.5 - 5.1 mmol/L 4.2  3.7    Chloride 98 - 111 mmol/L 103  104    CO2 22 - 32 mmol/L 25  21    Calcium 8.9 - 10.3 mg/dL 9.4  8.7    Total Protein 6.5 - 8.1 g/dL 7.1  6.8    Total Bilirubin 0.3 - 1.2 mg/dL 0.6  0.6    Alkaline Phos 38 - 126 U/L 85  84    AST 15 - 41 U/L 13  13    ALT 0 - 44 U/L 7  7        RADIOGRAPHIC STUDIES: I have personally reviewed the radiological images as listed and agreed with the findings in the report. No results found.    No orders of the defined types were placed in this encounter.  All questions were answered. The  patient knows to call the clinic with any problems, questions or concerns. No barriers to learning was detected. The total time spent in the appointment was 30 minutes.     Truitt Merle, MD 11/07/2021

## 2021-11-08 LAB — CANCER ANTIGEN 27.29: CA 27.29: 73.7 U/mL — ABNORMAL HIGH (ref 0.0–38.6)

## 2021-12-05 ENCOUNTER — Other Ambulatory Visit: Payer: Self-pay

## 2021-12-05 ENCOUNTER — Inpatient Hospital Stay: Payer: Medicare HMO | Attending: Hematology

## 2021-12-05 VITALS — BP 129/65 | HR 66 | Temp 98.2°F | Resp 16

## 2021-12-05 DIAGNOSIS — Z170 Estrogen receptor positive status [ER+]: Secondary | ICD-10-CM | POA: Diagnosis not present

## 2021-12-05 DIAGNOSIS — C792 Secondary malignant neoplasm of skin: Secondary | ICD-10-CM | POA: Insufficient documentation

## 2021-12-05 DIAGNOSIS — C50919 Malignant neoplasm of unspecified site of unspecified female breast: Secondary | ICD-10-CM

## 2021-12-05 DIAGNOSIS — C7951 Secondary malignant neoplasm of bone: Secondary | ICD-10-CM | POA: Diagnosis not present

## 2021-12-05 DIAGNOSIS — C50312 Malignant neoplasm of lower-inner quadrant of left female breast: Secondary | ICD-10-CM | POA: Insufficient documentation

## 2021-12-05 DIAGNOSIS — C787 Secondary malignant neoplasm of liver and intrahepatic bile duct: Secondary | ICD-10-CM | POA: Diagnosis not present

## 2021-12-05 DIAGNOSIS — Z5111 Encounter for antineoplastic chemotherapy: Secondary | ICD-10-CM | POA: Diagnosis not present

## 2021-12-05 DIAGNOSIS — R160 Hepatomegaly, not elsewhere classified: Secondary | ICD-10-CM

## 2021-12-05 MED ORDER — FULVESTRANT 250 MG/5ML IM SOSY
500.0000 mg | PREFILLED_SYRINGE | Freq: Once | INTRAMUSCULAR | Status: AC
  Administered 2021-12-05: 500 mg via INTRAMUSCULAR
  Filled 2021-12-05: qty 10

## 2021-12-13 ENCOUNTER — Other Ambulatory Visit: Payer: Self-pay | Admitting: Internal Medicine

## 2022-01-02 ENCOUNTER — Inpatient Hospital Stay: Payer: Medicare HMO | Attending: Hematology

## 2022-01-02 ENCOUNTER — Inpatient Hospital Stay: Payer: Medicare HMO

## 2022-01-02 ENCOUNTER — Other Ambulatory Visit: Payer: Self-pay

## 2022-01-02 ENCOUNTER — Inpatient Hospital Stay (HOSPITAL_BASED_OUTPATIENT_CLINIC_OR_DEPARTMENT_OTHER): Payer: Medicare HMO | Admitting: Hematology

## 2022-01-02 ENCOUNTER — Encounter: Payer: Self-pay | Admitting: Hematology

## 2022-01-02 VITALS — BP 132/80 | HR 76 | Temp 98.2°F | Resp 18 | Ht 66.0 in | Wt 155.6 lb

## 2022-01-02 DIAGNOSIS — Z5111 Encounter for antineoplastic chemotherapy: Secondary | ICD-10-CM | POA: Diagnosis not present

## 2022-01-02 DIAGNOSIS — C50919 Malignant neoplasm of unspecified site of unspecified female breast: Secondary | ICD-10-CM

## 2022-01-02 DIAGNOSIS — Z170 Estrogen receptor positive status [ER+]: Secondary | ICD-10-CM | POA: Insufficient documentation

## 2022-01-02 DIAGNOSIS — C7951 Secondary malignant neoplasm of bone: Secondary | ICD-10-CM | POA: Insufficient documentation

## 2022-01-02 DIAGNOSIS — C50312 Malignant neoplasm of lower-inner quadrant of left female breast: Secondary | ICD-10-CM | POA: Diagnosis not present

## 2022-01-02 DIAGNOSIS — C792 Secondary malignant neoplasm of skin: Secondary | ICD-10-CM | POA: Diagnosis not present

## 2022-01-02 DIAGNOSIS — C787 Secondary malignant neoplasm of liver and intrahepatic bile duct: Secondary | ICD-10-CM | POA: Insufficient documentation

## 2022-01-02 DIAGNOSIS — R160 Hepatomegaly, not elsewhere classified: Secondary | ICD-10-CM

## 2022-01-02 LAB — CMP (CANCER CENTER ONLY)
ALT: 7 U/L (ref 0–44)
AST: 14 U/L — ABNORMAL LOW (ref 15–41)
Albumin: 3.9 g/dL (ref 3.5–5.0)
Alkaline Phosphatase: 85 U/L (ref 38–126)
Anion gap: 8 (ref 5–15)
BUN: 18 mg/dL (ref 8–23)
CO2: 23 mmol/L (ref 22–32)
Calcium: 9.1 mg/dL (ref 8.9–10.3)
Chloride: 102 mmol/L (ref 98–111)
Creatinine: 1.19 mg/dL — ABNORMAL HIGH (ref 0.44–1.00)
GFR, Estimated: 46 mL/min — ABNORMAL LOW (ref >60)
Glucose, Bld: 107 mg/dL — ABNORMAL HIGH (ref 70–99)
Potassium: 4.4 mmol/L (ref 3.5–5.1)
Sodium: 133 mmol/L — ABNORMAL LOW (ref 135–145)
Total Bilirubin: 0.9 mg/dL (ref 0.3–1.2)
Total Protein: 6.8 g/dL (ref 6.5–8.1)

## 2022-01-02 LAB — CBC WITH DIFFERENTIAL (CANCER CENTER ONLY)
Abs Immature Granulocytes: 0.02 10*3/uL (ref 0.00–0.07)
Basophils Absolute: 0 10*3/uL (ref 0.0–0.1)
Basophils Relative: 0 %
Eosinophils Absolute: 0 10*3/uL (ref 0.0–0.5)
Eosinophils Relative: 0 %
HCT: 42.1 % (ref 36.0–46.0)
Hemoglobin: 14.6 g/dL (ref 12.0–15.0)
Immature Granulocytes: 0 %
Lymphocytes Relative: 11 %
Lymphs Abs: 1.1 10*3/uL (ref 0.7–4.0)
MCH: 32.2 pg (ref 26.0–34.0)
MCHC: 34.7 g/dL (ref 30.0–36.0)
MCV: 92.9 fL (ref 80.0–100.0)
Monocytes Absolute: 0.6 10*3/uL (ref 0.1–1.0)
Monocytes Relative: 7 %
Neutro Abs: 7.6 10*3/uL (ref 1.7–7.7)
Neutrophils Relative %: 82 %
Platelet Count: 213 10*3/uL (ref 150–400)
RBC: 4.53 MIL/uL (ref 3.87–5.11)
RDW: 11.9 % (ref 11.5–15.5)
WBC Count: 9.3 10*3/uL (ref 4.0–10.5)
nRBC: 0 % (ref 0.0–0.2)

## 2022-01-02 MED ORDER — ZOLPIDEM TARTRATE 10 MG PO TABS: 10.0000 mg | ORAL_TABLET | Freq: Every evening | ORAL | 0 refills | Status: DC | PRN

## 2022-01-02 MED ORDER — FULVESTRANT 250 MG/5ML IM SOSY
500.0000 mg | PREFILLED_SYRINGE | Freq: Once | INTRAMUSCULAR | Status: AC
  Administered 2022-01-02: 500 mg via INTRAMUSCULAR
  Filled 2022-01-02: qty 10

## 2022-01-02 MED ORDER — ALPRAZOLAM 0.25 MG PO TABS: ORAL_TABLET | ORAL | 0 refills | Status: DC

## 2022-01-02 NOTE — Progress Notes (Signed)
[place]   [phone] [phone]   Clinic Follow up Note   Patient Care Team: Carollee Herter, Alferd Apa, DO as PCP - General [name], MD as Consulting Physician (Breast Surgery) Pyrtle, Lajuan Lines, MD as Consulting Physician (Gastroenterology) Druscilla Brownie, MD as Referring Physician (Dermatology) Cindra Presume, MD (Inactive) as Consulting Physician (Plastic Surgery) Truitt Merle, MD as Consulting Physician (Hematology)  Date of Service:  01/02/2022  CHIEF COMPLAINT: f/u of metastatic breast cancer  CURRENT THERAPY:  Fulvestrant injections, starting 05/15/21, now q4weeks  ASSESSMENT & PLAN:  Sara Barnes is a 81 y.o. female with   1. 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 Barry Dienes. Oncotype RS of 4 (low risk). She received adjuvant radiation. She attempted letrozole previously but did not tolerate due to colitis (pt has Crohn's). -she developed a rash in early 2022. Excisional biopsy by Dr. Claudia Desanctis on 07/25/20 confirmed invasive carcinoma, felt to represent recurrence of breast cancer. She declined further work up with [place] PET scan and was subsequently lost to f/u. -she presented to the ED on 04/19/21 with nausea, vomiting, and diarrhea. She was diagnosed with C.diff colitis but was also found to have liver lesions on CT. Liver biopsy on 04/24/21 confirmed metastatic carcinoma, consistent with known mammary carcinoma. Her2 negative by FISH. -she was also noted to have bilateral pleural effusions on CT AP. Chest CT on 05/11/21: showed lower lobe compressive atelectasis, L>R; nonspecific right hilar lymphadenopathy; lytic bony lesions involving left 5th rib, left T5 transverse process, and left aspect of T10 vertebral body. Pleural fluid from thoracentesis on 05/13/21 showed only atypical mesothelial cells.  -Due to her colitis, she  may not tolerate CDK4/6 inhibitor well (due to potential diarrhea). After a lengthy discussion, she agreed to fulvestrant injections. She received her first dose on 05/15/21. She has tolerated well. -baseline CA 27.29 06/14/21 was elevated at 375.9. -restaging CT CAP and bone scan on 09/09/21 showed: improving hepatic metastasis (size decreased by 40-50%); stable osseous metastasis. No new lesions. She has had excellent response. -her CA 27.29 has also improved, down to 73.7 on 11/07/21 -she is clinically doing better. labs reviewed, overall stable. We will proceed with fulvestrant injection and continue every 4 weeks. -plan for restaging scans in 2 months before her next visit.   2. Anxiety, h/o depression -she is on 20 mg generic Prozac. She does not feel this is helping for her new anxiety surrounding her cancer. -I prescribed Xanax on 05/30/21 for her to use as needed. She is no longer taking daily. -she previously declined referral to counseling.   3. Crohn's disease and IBS -prior history of severe colitis -managed by Dr. Hilarie Fredrickson     PLAN: -proceed with fulvestrant injection today and continue every 4 weeks -I refilled Xanax -f/u in 8 weeks, with lab, CT, and bone scan several days before   No problem-specific Assessment & Plan notes found for this encounter.   SUMMARY OF ONCOLOGIC HISTORY: Oncology History Overview Note  Cancer of lower-inner quadrant of left female breast [facility])   Staging form: Breast, AJCC 7th Edition     Clinical stage from 12/06/2014: Stage IA (T1c, N0, M0) - Unsigned     Pathologic stage from 12/13/2014: Stage IA (T1c, N0, cM0) - Unsigned      Cancer of lower-inner quadrant of left female breast ([place])  11/28/2014 Breast US  Highly suspicious 1.6 cm irregular shadowing mass at the site of palpable concern in the subareolar lower inner quadrant of the left breast at approximately 630.   11/29/2014 Receptors her2   Estrogen Receptor: 100%, POSITIVE, STRONG  STAINING INTENSITY (PERFORMED MANUALLY) Progesterone Receptor: 95%, POSITIVE, STRONG STAINING INTENSITY (PERFORMED MANUALLY) Proliferation Marker Ki67: 10% (PERFORMED MANUALLY), Her2neu negative   11/29/2014 Initial Biopsy   Breast, left, needle core biopsy, subareolar about 6:30 - INVASIVE DUCTAL CARCINOMA, SEE COMMENT.   11/30/2014 Initial Diagnosis   Cancer of lower-inner quadrant of left female breast   12/13/2014 Pathology Results   Left breast lumpectomy showed a 1.7 cm invasive ductal carcinoma, grade 1, margins were negative, (+) LVI, 5 Sentinel lymph nodes were negative,   12/13/2014 Oncotype testing   RS 4, which predicts 10-year risk of distant recurrence 4% with tamoxifen alone.   02/05/2015 - 02/26/2015 Radiation Therapy   left breast radiaiton    03/18/2015 -  Anti-estrogen oral therapy   She tried letrozole for 2-3 weeks, could not tolerate and stopped.   07/25/2020 Pathology Results   FINAL MICROSCOPIC DIAGNOSIS: A. SKIN, LEFT CHEST, EXCISION: - Invasive carcinoma involving dermis and subcutaneous soft tissue, consistent with patient's clinical history of invasive ductal carcinoma, see comment - Deep and lateral resection margins are negative for carcinoma  Appears grade I  ADDENDUM: PROGNOSTIC INDICATOR RESULTS: Immunohistochemical and morphometric analysis performed manually The tumor cells are NEGATIVE for Her2 (1+). Estrogen Receptor: POSITIVE, 90%, STRONG STAINING Progesterone Receptor: POSITIVE, 95%, STRONG STAINING Proliferation Marker Ki-67: 5%    04/19/2021 Imaging   EXAM: CT ABDOMEN AND PELVIS WITH CONTRAST  IMPRESSION: 1. Multiple hypodense hepatic masses, as detailed above, highly suspicious for metastatic disease. Differential include infectious/inflammatory process. Further evaluation with MRI examination with contrast would be helpful. 2. Moderate left and small right pleural effusion. 3. Postsurgical changes for prior hemicolectomy. No  bowel obstruction, colitis or diverticulitis.   04/24/2021 Pathology Results   FINAL MICROSCOPIC DIAGNOSIS:   A. LIVER, BIOPSY:  -  Metastatic carcinoma  -  See comment   COMMENT:  By immunohistochemistry, the neoplastic cells are positive for  cytokeratin 7, ER (100%, strong), GATA3 and PAX8 (patchy weak) but negative for cytokeratin 20 and TTF-1.  Overall, the morphology and immunophenotype supports metastasis from the patient's known mammary carcinoma.   ADDENDUM:  By immunohistochemistry, HER-2 is EQUIVOCAL (2+).    ADDENDUM:  FLOURESCENCE IN-SITU HYBRIDIZATION RESULTS:  GROUP 5:   HER2 **NEGATIVE**    05/11/2021 Imaging   EXAM: CT CHEST WITH CONTRAST  IMPRESSION: 1. Bilateral pleural effusions and lower lobe compressive atelectasis, left greater than right. 2. Nonspecific right hilar lymphadenopathy. 3. Bony lytic lesions involving the left posterior fifth rib, left T5 transverse process, and left aspect of the T10 vertebral body, consistent with bony metastases. 4. Patchy right upper lobe ground-glass airspace disease, which may be inflammatory or infectious. 5. Slight decrease in size of the left lobe subcapsular liver hematoma. No evidence of active hemorrhage. 6. Stable liver hypodensities consistent with presumed metastatic disease. Please correlate with previous biopsy results.   05/13/2021 Pathology Results   Specimen Submitted:  A. PLEURAL FLUID, LEFT, THORACENTESIS:   FINAL MICROSCOPIC DIAGNOSIS:  - Atypical mesothelial cells present  - Chronic inflammation       INTERVAL HISTORY:  Sara Barnes is here for a follow up of metastatic breast cancer. She was last seen by me on 11/07/21. She presents to the clinic accompanied by her daughter. She reports she is  doing well overall. She reports some weakness and occasional abdominal pain.   All other systems were reviewed with the patient and are negative.  MEDICAL HISTORY:  Past Medical History:   Diagnosis Date   Allergy    Anemia    Anxiety    Arthritis    Basal cell carcinoma    Blood transfusion without reported diagnosis    Bowel perforation ([place])    Cancer of lower-inner quadrant of female breast ([place]) 11/30/2014   left   Cancer of lower-inner quadrant of left female breast ([place]) 11/30/2014   Clostridium difficile infection    Complication of anesthesia    anxiety post-op after ear surgeries   Crohn's disease of large intestine with abscess ([place])    Dental crowns present    recent root canal 11/2014   Depression    Family history of adverse reaction to anesthesia    pt's sister has hx. 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   Seasonal allergies    TMJ (temporomandibular joint syndrome)    Ulcerative colitis    Vitamin B12 deficiency     SURGICAL HISTORY: Past Surgical History:  Procedure Laterality Date   ABDOMINAL HYSTERECTOMY     partial   APPENDECTOMY     AUGMENTATION MAMMAPLASTY Bilateral    BASAL CELL CARCINOMA EXCISION Left    nose   BREAST BIOPSY Left 2019   benign   BREAST ENHANCEMENT SURGERY     BREAST LUMPECTOMY Left 2016   BREAST LUMPECTOMY WITH RADIOACTIVE SEED AND SENTINEL LYMPH NODE BIOPSY Left 12/13/2014   Procedure: LEFT BREAST LUMPECTOMY WITH RADIOACTIVE SEED AND SENTINEL LYMPH NODE BIOPSY;  Surgeon: [name], MD;  Location: [facility];  Service: General;  Laterality: Left;   COLONOSCOPY WITH PROPOFOL  05/28/2011; 06/28/2014   EXPLORATORY LAPAROTOMY  04/08/2018   with end ileostomy takedown and creation of loop ileostomy-- [facility] N/A 01/08/2016   Procedure:  Beryle Quant;  Surgeon: [name], MD;  Location: [facility];  Service: Gastroenterology;  Laterality: N/A;  needs MAC due to pain and anxiety   ILEOSTOMY N/A 05/25/2017   Procedure: DILATION OF ILEOSTOMY;  Surgeon: Rolm Bookbinder, MD;  Location: [facility];  Service: General;  Laterality: N/A;   ILEOSTOMY  02/11/2016   END ileostomy.  Dr Hassell Done   ILEOSTOMY CLOSURE N/A 06/02/2017   Procedure: RESECTION OF ILEAL STRICTURE; ILEOSTOMY REVISION ;  Surgeon: Michael Boston, MD;  Location: [facility];  Service: General;  Laterality: N/A;   ILEOSTOMY DILATION  05/25/2017   DILITATION OF ILEAL STRICTURE   ileostomy stricture resection  06/02/2017   with revision   INCISION AND DRAINAGE ABSCESS  2018   INCISION AND DRAINAGE PERIRECTAL ABSCESS N/A 09/28/2013   Procedure: IRRIGATION AND DEBRIDEMENT PERIANAL ABSCESS, proctoscopy;  Surgeon: Shann Medal, MD;  Location: [facility];  Service: General;  Laterality: N/A;   INCISION AND DRAINAGE PERIRECTAL ABSCESS N/A 01/22/2016   Procedure: IRRIGATION AND DEBRIDEMENT PERIRECTAL ABSCESS;  Surgeon: Armandina Gemma, MD;  Location: [facility];  Service: General;  Laterality: N/A;   INNER EAR  SURGERY Bilateral    LAPAROSCOPIC DIVERTED COLOSTOMY N/A 02/11/2016   Procedure: LAPAROSCOPIC DIVERTED ILEOSTOMY;  Surgeon: Johnathan Hausen, MD;  Location: [facility];  Service: General;  Laterality: N/A;   LAPAROSCOPY N/A 03/23/2017   Procedure: LAPAROSCOPY DIAGNOSTIC, EXPLORATORY LAPAROTOMY,  BOWEL RESECTION;  Surgeon: Jackolyn Confer, MD;  Location: [facility];  Service: General;  Laterality: N/A;    I have reviewed the social history and family history with the patient and they are unchanged from previous note.  ALLERGIES:  is allergic to nsaids, codeine, mesalamine, oxycodone, lactose intolerance (gi), norco [hydrocodone-acetaminophen], other, and sulfa antibiotics.  MEDICATIONS:  Current Outpatient Medications  Medication Sig Dispense Refill   acetaminophen (TYLENOL)  500 MG tablet You can take 2 tablets every 6 hours as needed for pain.  Use this as your primary pain control.  Use Tramadol as a last resort.  You can buy this over the counter at any drug store.    DO NOT TAKE MORE THAN 4000 MG OF TYLENOL PER DAY.  IT CAN HARM YOUR LIVER. 30 tablet 0   ALPRAZolam (XANAX) 0.25 MG tablet TAKE 1 TO 2 TABLETS(0.25 TO 0.5 MG) BY MOUTH TWICE DAILY AS NEEDED FOR ANXIETY 60 tablet 0   cyanocobalamin (,VITAMIN B-12,) 1000 MCG/ML injection As directed (Patient taking differently: 1,000 mcg every 30 (thirty) days. First Sunday of each month) 3 mL 11   diphenoxylate-atropine (LOMOTIL) 2.5-0.025 MG tablet Take 1-2 tablets by mouth 3-4 times daily AS NEEDED for severe diarrhea 240 tablet 6   famotidine (PEPCID) 20 MG tablet Take 1 tablet (20 mg total) by mouth 2 (two) times daily. (Patient taking differently: Take 20 mg by mouth daily as needed for indigestion.) 180 tablet 6   FLUoxetine (PROZAC) 40 MG capsule Take 1 capsule (40 mg total) by mouth daily. Pharmacy- please d/c rx for prozac 20 mg daily. thx 90 capsule 1   fluticasone (FLONASE) 50 MCG/ACT nasal spray Place 2 sprays into both nostrils daily. (Patient taking differently: Place 2 sprays into both nostrils daily as needed for rhinitis.) 16 g 6   loperamide (IMODIUM) 2 MG capsule Take 1 capsule (2 mg total) by mouth as needed for diarrhea or loose stools. 90 capsule 1   ondansetron (ZOFRAN-ODT) 4 MG disintegrating tablet DISSOLVE ONE TABLET BY MOUTH EVERY 6 HOURS AS NEEDED FOR NAUSEA AND VOMITING 90 tablet 6   predniSONE (DELTASONE) 5 MG tablet TAKE 1 TABLET(5 MG) BY MOUTH DAILY 90 tablet 0   zolpidem (AMBIEN) 10 MG tablet Take 1 tablet (10 mg total) by mouth at bedtime as needed for sleep. 90 tablet 0   No current facility-administered medications for this visit.    PHYSICAL EXAMINATION: ECOG PERFORMANCE STATUS: 2 - Symptomatic, <50% confined to bed  Vitals:   01/02/22 1224  BP: 132/80  Pulse: 76  Resp: 18   Temp: 98.2 F (36.8 C)  SpO2: 97%   Wt Readings from Last 3 Encounters:  01/02/22 155 lb 9.6 oz (70.6 kg)  11/07/21 155 lb 11.2 oz (70.6 kg)  09/12/21 154 lb 4.8 oz (70 kg)     GENERAL:alert, no distress and comfortable SKIN: skin color, texture, turgor are normal, no rashes or significant lesions EYES: normal, Conjunctiva are pink and non-injected, sclera clear  NECK: supple, thyroid normal size, non-tender, without nodularity LYMPH:  no palpable lymphadenopathy in the cervical, axillary  LUNGS: clear to auscultation and percussion with normal breathing effort HEART: regular rate & rhythm and no murmurs and no lower extremity edema ABDOMEN:abdomen  soft, non-tender and active bowel sounds Musculoskeletal:no cyanosis of digits and no clubbing  NEURO: alert & oriented x 3 with fluent speech, no focal motor/sensory deficits  LABORATORY DATA:  I have reviewed the data as listed    Latest Ref Rng & Units 01/02/2022   12:03 PM 11/07/2021    1:19 PM 09/12/2021   12:38 PM  CBC  WBC 4.0 - 10.5 K/uL 9.3  8.4  7.2   Hemoglobin 12.0 - 15.0 g/dL 14.6  14.5  14.4   Hematocrit 36.0 - 46.0 % 42.1  42.3  41.4   Platelets 150 - 400 K/uL 213  203  222         Latest Ref Rng & Units 01/02/2022   12:03 PM 11/07/2021    1:19 PM 09/12/2021   12:38 PM  CMP  Glucose 70 - 99 mg/dL 107  100  113   BUN 8 - 23 mg/dL _0 Creatinine 0.44 - 1.00 mg/dL 1.19  0.99  1.02   Sodium 135 - 145 mmol/L 133  134  133   Potassium 3.5 - 5.1 mmol/L 4.4  4.2  3.7   Chloride 98 - 111 mmol/L 102  103  104   CO2 22 - 32 mmol/L _1 Calcium 8.9 - 10.3 mg/dL 9.1  9.4  8.7   Total Protein 6.5 - 8.1 g/dL 6.8  7.1  6.8   Total Bilirubin 0.3 - 1.2 mg/dL 0.9  0.6  0.6   Alkaline Phos 38 - 126 U/L 85  85  84   AST 15 - 41 U/L _2 ALT 0 - 44 U/L _3 RADIOGRAPHIC STUDIES: I have personally reviewed the radiological images as listed and agreed with the findings in the report. No  results found.    Orders Placed This Encounter  Procedures   CT CHEST ABDOMEN PELVIS W CONTRAST    Hold iv contrast if GFR<50    Standing Status:   Future    Standing Expiration Date:   01/03/2023    Order Specific Question:   Preferred imaging location?    Answer:   [facility]    Order Specific Question:   Release to patient    Answer:   Immediate    Order Specific Question:   Is Oral Contrast requested for this exam?    Answer:   Yes, Per Radiology protocol   NM Bone Scan Whole Body    Standing Status:   Future    Standing Expiration Date:   01/02/2023    Order Specific Question:   If indicated for the ordered procedure, I authorize the administration of a radiopharmaceutical per Radiology protocol    Answer:   Yes    Order Specific Question:   Preferred imaging location?    Answer:   [facility]   All questions were answered. The patient knows to call the clinic with any problems, questions or concerns. No barriers to learning was detected. The total time spent in the appointment was 30 minutes.     Truitt Merle, MD 01/02/2022   I, Wilburn Mylar, am acting as scribe for Truitt Merle, MD.   I have reviewed the above documentation for accuracy and completeness, and I agree with the above.

## 2022-01-03 LAB — CANCER ANTIGEN 27.29: CA 27.29: 42.3 U/mL — ABNORMAL HIGH (ref 0.0–38.6)

## 2022-01-30 ENCOUNTER — Inpatient Hospital Stay: Payer: Medicare HMO | Attending: Hematology

## 2022-01-30 ENCOUNTER — Other Ambulatory Visit: Payer: Self-pay

## 2022-01-30 ENCOUNTER — Other Ambulatory Visit: Payer: Self-pay | Admitting: Hematology

## 2022-01-30 VITALS — BP 123/78 | HR 64 | Temp 99.1°F | Resp 16

## 2022-01-30 DIAGNOSIS — C787 Secondary malignant neoplasm of liver and intrahepatic bile duct: Secondary | ICD-10-CM | POA: Diagnosis not present

## 2022-01-30 DIAGNOSIS — C7951 Secondary malignant neoplasm of bone: Secondary | ICD-10-CM | POA: Insufficient documentation

## 2022-01-30 DIAGNOSIS — Z5111 Encounter for antineoplastic chemotherapy: Secondary | ICD-10-CM | POA: Diagnosis not present

## 2022-01-30 DIAGNOSIS — R160 Hepatomegaly, not elsewhere classified: Secondary | ICD-10-CM

## 2022-01-30 DIAGNOSIS — C792 Secondary malignant neoplasm of skin: Secondary | ICD-10-CM | POA: Diagnosis not present

## 2022-01-30 DIAGNOSIS — C50312 Malignant neoplasm of lower-inner quadrant of left female breast: Secondary | ICD-10-CM | POA: Diagnosis not present

## 2022-01-30 DIAGNOSIS — C50919 Malignant neoplasm of unspecified site of unspecified female breast: Secondary | ICD-10-CM

## 2022-01-30 DIAGNOSIS — Z170 Estrogen receptor positive status [ER+]: Secondary | ICD-10-CM | POA: Insufficient documentation

## 2022-01-30 MED ORDER — FULVESTRANT 250 MG/5ML IM SOSY
500.0000 mg | PREFILLED_SYRINGE | Freq: Once | INTRAMUSCULAR | Status: AC
  Administered 2022-01-30: 500 mg via INTRAMUSCULAR
  Filled 2022-01-30: qty 10

## 2022-02-10 ENCOUNTER — Telehealth: Payer: Self-pay

## 2022-02-10 NOTE — Telephone Encounter (Signed)
Pt's sister LVM asking if Dr. Burr Medico approve of pt getting flu vaccine.  Returned pt's sister's call.  LVM stating that Dr. Burr Medico approves of non-active variant Flu Vaccines.  Instructed pt and sister to contact Dr. Ernestina Penna office should they have additional questions or concerns.

## 2022-02-12 ENCOUNTER — Other Ambulatory Visit: Payer: Self-pay | Admitting: Hematology

## 2022-02-12 ENCOUNTER — Other Ambulatory Visit: Payer: Self-pay | Admitting: Nurse Practitioner

## 2022-02-12 MED ORDER — ALPRAZOLAM 0.25 MG PO TABS: 0.2500 mg | ORAL_TABLET | Freq: Two times a day (BID) | ORAL | 0 refills | Status: DC | PRN

## 2022-02-20 ENCOUNTER — Ambulatory Visit (INDEPENDENT_AMBULATORY_CARE_PROVIDER_SITE_OTHER): Payer: Medicare HMO | Admitting: *Deleted

## 2022-02-20 DIAGNOSIS — Z23 Encounter for immunization: Secondary | ICD-10-CM

## 2022-02-20 NOTE — Progress Notes (Signed)
immPatient here for high dose flu vaccine.  Vaccine given in right deltoid and patient tolerated well.

## 2022-02-25 ENCOUNTER — Encounter (HOSPITAL_COMMUNITY)
Admission: RE | Admit: 2022-02-25 | Discharge: 2022-02-25 | Disposition: A | Discharge status: Home / Self Care | Payer: Medicare HMO | Source: Ambulatory Visit | Attending: Hematology | Admitting: Hematology

## 2022-02-25 ENCOUNTER — Ambulatory Visit (HOSPITAL_COMMUNITY)
Admission: RE | Admit: 2022-02-25 | Discharge: 2022-02-25 | Disposition: A | Discharge status: Home / Self Care | Payer: Medicare HMO | Source: Ambulatory Visit | Attending: Hematology | Admitting: Hematology

## 2022-02-25 ENCOUNTER — Encounter (HOSPITAL_COMMUNITY): Payer: Self-pay

## 2022-02-25 DIAGNOSIS — C50919 Malignant neoplasm of unspecified site of unspecified female breast: Secondary | ICD-10-CM | POA: Diagnosis not present

## 2022-02-25 DIAGNOSIS — Z170 Estrogen receptor positive status [ER+]: Secondary | ICD-10-CM | POA: Insufficient documentation

## 2022-02-25 DIAGNOSIS — K769 Liver disease, unspecified: Secondary | ICD-10-CM | POA: Diagnosis not present

## 2022-02-25 DIAGNOSIS — C50312 Malignant neoplasm of lower-inner quadrant of left female breast: Secondary | ICD-10-CM | POA: Diagnosis not present

## 2022-02-25 DIAGNOSIS — J90 Pleural effusion, not elsewhere classified: Secondary | ICD-10-CM | POA: Diagnosis not present

## 2022-02-25 LAB — POCT I-STAT CREATININE: Creatinine, Ser: 0.9 mg/dL (ref 0.44–1.00)

## 2022-02-25 MED ORDER — TECHNETIUM TC 99M MEDRONATE IV KIT
20.0000 | PACK | Freq: Once | INTRAVENOUS | Status: AC | PRN
  Administered 2022-02-25: 20 via INTRAVENOUS

## 2022-02-25 MED ORDER — IOHEXOL 300 MG/ML  SOLN
100.0000 mL | Freq: Once | INTRAMUSCULAR | Status: AC | PRN
  Administered 2022-02-25: 100 mL via INTRAVENOUS

## 2022-02-25 MED ORDER — SODIUM CHLORIDE (PF) 0.9 % IJ SOLN
INTRAMUSCULAR | Status: AC
  Filled 2022-02-25: qty 50

## 2022-02-27 ENCOUNTER — Encounter: Payer: Self-pay | Admitting: Hematology

## 2022-02-27 ENCOUNTER — Inpatient Hospital Stay: Payer: Medicare HMO

## 2022-02-27 ENCOUNTER — Encounter: Payer: Self-pay | Admitting: Family Medicine

## 2022-02-27 ENCOUNTER — Inpatient Hospital Stay: Payer: Medicare HMO | Attending: Hematology | Admitting: Hematology

## 2022-02-27 ENCOUNTER — Other Ambulatory Visit: Payer: Self-pay

## 2022-02-27 VITALS — BP 133/80 | HR 70 | Temp 98.3°F | Resp 18 | Ht 66.0 in | Wt 156.2 lb

## 2022-02-27 DIAGNOSIS — E538 Deficiency of other specified B group vitamins: Secondary | ICD-10-CM | POA: Diagnosis not present

## 2022-02-27 DIAGNOSIS — C792 Secondary malignant neoplasm of skin: Secondary | ICD-10-CM | POA: Diagnosis not present

## 2022-02-27 DIAGNOSIS — C50312 Malignant neoplasm of lower-inner quadrant of left female breast: Secondary | ICD-10-CM | POA: Insufficient documentation

## 2022-02-27 DIAGNOSIS — C50919 Malignant neoplasm of unspecified site of unspecified female breast: Secondary | ICD-10-CM

## 2022-02-27 DIAGNOSIS — C787 Secondary malignant neoplasm of liver and intrahepatic bile duct: Secondary | ICD-10-CM | POA: Diagnosis not present

## 2022-02-27 DIAGNOSIS — Z5111 Encounter for antineoplastic chemotherapy: Secondary | ICD-10-CM | POA: Insufficient documentation

## 2022-02-27 DIAGNOSIS — Z170 Estrogen receptor positive status [ER+]: Secondary | ICD-10-CM | POA: Insufficient documentation

## 2022-02-27 DIAGNOSIS — R160 Hepatomegaly, not elsewhere classified: Secondary | ICD-10-CM

## 2022-02-27 DIAGNOSIS — C7951 Secondary malignant neoplasm of bone: Secondary | ICD-10-CM | POA: Diagnosis not present

## 2022-02-27 LAB — CMP (CANCER CENTER ONLY)
ALT: 8 U/L (ref 0–44)
AST: 14 U/L — ABNORMAL LOW (ref 15–41)
Albumin: 3.9 g/dL (ref 3.5–5.0)
Alkaline Phosphatase: 75 U/L (ref 38–126)
Anion gap: 7 (ref 5–15)
BUN: 16 mg/dL (ref 8–23)
CO2: 23 mmol/L (ref 22–32)
Calcium: 9.1 mg/dL (ref 8.9–10.3)
Chloride: 104 mmol/L (ref 98–111)
Creatinine: 0.97 mg/dL (ref 0.44–1.00)
GFR, Estimated: 59 mL/min — ABNORMAL LOW (ref >60)
Glucose, Bld: 110 mg/dL — ABNORMAL HIGH (ref 70–99)
Potassium: 4.1 mmol/L (ref 3.5–5.1)
Sodium: 134 mmol/L — ABNORMAL LOW (ref 135–145)
Total Bilirubin: 0.6 mg/dL (ref 0.3–1.2)
Total Protein: 6.9 g/dL (ref 6.5–8.1)

## 2022-02-27 LAB — CBC WITH DIFFERENTIAL (CANCER CENTER ONLY)
Abs Immature Granulocytes: 0.02 10*3/uL (ref 0.00–0.07)
Basophils Absolute: 0 10*3/uL (ref 0.0–0.1)
Basophils Relative: 0 %
Eosinophils Absolute: 0 10*3/uL (ref 0.0–0.5)
Eosinophils Relative: 0 %
HCT: 42.5 % (ref 36.0–46.0)
Hemoglobin: 14.5 g/dL (ref 12.0–15.0)
Immature Granulocytes: 0 %
Lymphocytes Relative: 12 %
Lymphs Abs: 0.9 10*3/uL (ref 0.7–4.0)
MCH: 31.9 pg (ref 26.0–34.0)
MCHC: 34.1 g/dL (ref 30.0–36.0)
MCV: 93.6 fL (ref 80.0–100.0)
Monocytes Absolute: 0.4 10*3/uL (ref 0.1–1.0)
Monocytes Relative: 5 %
Neutro Abs: 6.3 10*3/uL (ref 1.7–7.7)
Neutrophils Relative %: 83 %
Platelet Count: 251 10*3/uL (ref 150–400)
RBC: 4.54 MIL/uL (ref 3.87–5.11)
RDW: 11.9 % (ref 11.5–15.5)
WBC Count: 7.6 10*3/uL (ref 4.0–10.5)
nRBC: 0 % (ref 0.0–0.2)

## 2022-02-27 MED ORDER — FULVESTRANT 250 MG/5ML IM SOSY
500.0000 mg | PREFILLED_SYRINGE | Freq: Once | INTRAMUSCULAR | Status: AC
  Administered 2022-02-27: 500 mg via INTRAMUSCULAR
  Filled 2022-02-27: qty 10

## 2022-02-27 MED ORDER — FLUOXETINE HCL 20 MG PO CAPS: 20.0000 mg | ORAL_CAPSULE | Freq: Every day | ORAL | 1 refills | Status: DC

## 2022-02-27 NOTE — Progress Notes (Signed)
[facility]   [phone] [phone]   Clinic Follow up Note   Patient Care Team: Carollee Herter, Alferd Apa, DO as PCP - General [name], MD as Consulting Physician (Breast Surgery) Pyrtle, Lajuan Lines, MD as Consulting Physician (Gastroenterology) Druscilla Brownie, MD as Referring Physician (Dermatology) Cindra Presume, MD (Inactive) as Consulting Physician (Plastic Surgery) Truitt Merle, MD as Consulting Physician (Hematology)  Date of Service:  02/27/2022  CHIEF COMPLAINT: f/u of metastatic breast cancer  CURRENT THERAPY:  Fulvestrant injections, starting 05/15/21, now q4weeks   ASSESSMENT & PLAN:  Sara Barnes is a 81 y.o. female with   1. 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 Barry Dienes. Oncotype RS of 4 (low risk). She received adjuvant radiation. She attempted letrozole previously but did not tolerate due to colitis (pt has Crohn's). -she developed a rash in early 2022. Excisional biopsy by Dr. Claudia Desanctis on 07/25/20 confirmed invasive carcinoma, felt to represent recurrence of breast cancer. She declined further work up with [place] PET scan and was subsequently lost to f/u. -she presented to the ED on 04/19/21 with nausea, vomiting, and diarrhea. She was diagnosed with C.diff colitis but was also found to have liver lesions on CT. Liver biopsy on 04/24/21 confirmed metastatic carcinoma, consistent with known mammary carcinoma. Her2 negative by FISH. -she was also noted to have bilateral pleural effusions on CT AP. Chest CT on 05/11/21: showed lower lobe compressive atelectasis, L>R; nonspecific right hilar lymphadenopathy; lytic bony lesions involving left 5th rib, left T5 transverse process, and left aspect of T10 vertebral body. Pleural fluid from thoracentesis on 05/13/21 showed only atypical mesothelial cells.  -Due to her colitis, she  may not tolerate CDK4/6 inhibitor well (due to potential diarrhea). After a lengthy discussion, she agreed to fulvestrant injections. She received her first dose on 05/15/21. She has tolerated well. -baseline CA 27.29 06/14/21 was elevated at 375.9. This has been steadily decreasing on treatment, most recently 42.3 on 01/02/22. -restaging CT CAP 02/25/22 showed good treatment response: improvement to liver lesions and bilateral pleural effusions, resolution of LLQ nodule, stable osseous metastatic lesions. No evidence of new metastatic disease. -I reviewed the results with them today. She continues to have excellent response and do clinically well except for stable mild body aches and fatigue. Labs reviewed, overall stable. We will proceed with fulvestrant injection and continue every 4 weeks.   2. Anxiety, h/o depression -she is on 20 mg generic Prozac. She does not feel this is helping for her new anxiety surrounding her cancer. -I prescribed Xanax on 05/30/21 for her to use as needed. She is no longer taking daily. -she previously declined referral to counseling. -she was increased to 34m prozac in 06/2021. She tells me she is taking every other day. She reports difficulty sleeping, which I explained could be related to her not taking daily. I will call in 220mfor her and advised her to take daily.   3. Crohn's disease and IBS -prior history of severe colitis -managed by Dr. PyHilarie Fredrickson   PLAN: -proceed with fulvestrant injection today and continue every 4 weeks -I refilled prozac at 20 mg daily -lab and f/u in 12 weeks   No problem-specific Assessment & Plan notes found for this encounter.   SUMMARY OF ONCOLOGIC HISTORY: Oncology History Overview Note  Cancer of lower-inner quadrant of left female breast ([facility]  Staging form: Breast, AJCC 7th Edition     Clinical stage from 12/06/2014: Stage IA (T1c, N0, M0) - Unsigned     Pathologic stage from 12/13/2014: Stage IA (T1c, N0, cM0) -  Unsigned      Cancer of lower-inner quadrant of left female breast ([place])  11/28/2014 Breast US   Highly suspicious 1.6 cm irregular shadowing mass at the site of palpable concern in the subareolar lower inner quadrant of the left breast at approximately 630.   11/29/2014 Receptors her2   Estrogen Receptor: 100%, POSITIVE, STRONG STAINING INTENSITY (PERFORMED MANUALLY) Progesterone Receptor: 95%, POSITIVE, STRONG STAINING INTENSITY (PERFORMED MANUALLY) Proliferation Marker Ki67: 10% (PERFORMED MANUALLY), Her2neu negative   11/29/2014 Initial Biopsy   Breast, left, needle core biopsy, subareolar about 6:30 - INVASIVE DUCTAL CARCINOMA, SEE COMMENT.   11/30/2014 Initial Diagnosis   Cancer of lower-inner quadrant of left female breast   12/13/2014 Pathology Results   Left breast lumpectomy showed a 1.7 cm invasive ductal carcinoma, grade 1, margins were negative, (+) LVI, 5 Sentinel lymph nodes were negative,   12/13/2014 Oncotype testing   RS 4, which predicts 10-year risk of distant recurrence 4% with tamoxifen alone.   02/05/2015 - 02/26/2015 Radiation Therapy   left breast radiaiton    03/18/2015 -  Anti-estrogen oral therapy   She tried letrozole for 2-3 weeks, could not tolerate and stopped.   07/25/2020 Pathology Results   FINAL MICROSCOPIC DIAGNOSIS: A. SKIN, LEFT CHEST, EXCISION: - Invasive carcinoma involving dermis and subcutaneous soft tissue, consistent with patient's clinical history of invasive ductal carcinoma, see comment - Deep and lateral resection margins are negative for carcinoma  Appears grade I  ADDENDUM: PROGNOSTIC INDICATOR RESULTS: Immunohistochemical and morphometric analysis performed manually The tumor cells are NEGATIVE for Her2 (1+). Estrogen Receptor: POSITIVE, 90%, STRONG STAINING Progesterone Receptor: POSITIVE, 95%, STRONG STAINING Proliferation Marker Ki-67: 5%    04/19/2021 Imaging   EXAM: CT ABDOMEN AND PELVIS WITH CONTRAST  IMPRESSION: 1.  Multiple hypodense hepatic masses, as detailed above, highly suspicious for metastatic disease. Differential include infectious/inflammatory process. Further evaluation with MRI examination with contrast would be helpful. 2. Moderate left and small right pleural effusion. 3. Postsurgical changes for prior hemicolectomy. No bowel obstruction, colitis or diverticulitis.   04/24/2021 Pathology Results   FINAL MICROSCOPIC DIAGNOSIS:   A. LIVER, BIOPSY:  -  Metastatic carcinoma  -  See comment   COMMENT:  By immunohistochemistry, the neoplastic cells are positive for  cytokeratin 7, ER (100%, strong), GATA3 and PAX8 (patchy weak) but negative for cytokeratin 20 and TTF-1.  Overall, the morphology and immunophenotype supports metastasis from the patient's known mammary carcinoma.   ADDENDUM:  By immunohistochemistry, HER-2 is EQUIVOCAL (2+).    ADDENDUM:  FLOURESCENCE IN-SITU HYBRIDIZATION RESULTS:  GROUP 5:   HER2 **NEGATIVE**    05/11/2021 Imaging   EXAM: CT CHEST WITH CONTRAST  IMPRESSION: 1. Bilateral pleural effusions and lower lobe compressive atelectasis, left greater than right. 2. Nonspecific right hilar lymphadenopathy. 3. Bony lytic lesions involving the left posterior fifth rib, left T5 transverse process, and left aspect of the T10 vertebral body, consistent with bony metastases. 4. Patchy right upper lobe ground-glass airspace disease, which may be inflammatory or infectious. 5. Slight decrease in size of the left lobe subcapsular liver hematoma. No evidence of active hemorrhage. 6. Stable liver hypodensities consistent with presumed metastatic disease. Please correlate with previous biopsy results.   05/13/2021 Pathology Results   Specimen Submitted:  A. PLEURAL FLUID, LEFT, THORACENTESIS:  FINAL MICROSCOPIC DIAGNOSIS:  - Atypical mesothelial cells present  - Chronic inflammation       INTERVAL HISTORY:  CHRISTNE PLATTS is here for a follow up of metastatic  breast cancer. She was last seen by me on 01/02/22. She presents to the clinic accompanied by her sister. She reports she is having more difficulty sleeping. She notes the only change was in her antidepressant medication. She tells me she is taking 40 mg every other day. She reports she is otherwise doing okay with stable body aches.   All other systems were reviewed with the patient and are negative.  MEDICAL HISTORY:  Past Medical History:  Diagnosis Date   Allergy    Anemia    Anxiety    Arthritis    Basal cell carcinoma    Blood transfusion without reported diagnosis    Bowel perforation ([place])    Cancer of lower-inner quadrant of female breast ([place]) 11/30/2014   left   Cancer of lower-inner quadrant of left female breast ([place]) 11/30/2014   Clostridium difficile infection    Complication of anesthesia    anxiety post-op after ear surgeries   Crohn's disease of large intestine with abscess ([place])    Dental crowns present    recent root canal 11/2014   Depression    Family history of adverse reaction to anesthesia    pt's sister has hx. 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   Seasonal allergies    TMJ (temporomandibular joint syndrome)    Ulcerative colitis    Vitamin B12 deficiency     SURGICAL HISTORY: Past Surgical History:  Procedure Laterality Date   ABDOMINAL HYSTERECTOMY     partial   APPENDECTOMY     AUGMENTATION MAMMAPLASTY Bilateral    BASAL CELL CARCINOMA EXCISION Left    nose   BREAST BIOPSY Left 2019   benign   BREAST ENHANCEMENT SURGERY     BREAST LUMPECTOMY Left 2016   BREAST LUMPECTOMY WITH RADIOACTIVE SEED AND SENTINEL LYMPH NODE BIOPSY Left 12/13/2014    Procedure: LEFT BREAST LUMPECTOMY WITH RADIOACTIVE SEED AND SENTINEL LYMPH NODE BIOPSY;  Surgeon: [name], MD;  Location: [facility];  Service: General;  Laterality: Left;   COLONOSCOPY WITH PROPOFOL  05/28/2011; 06/28/2014   EXPLORATORY LAPAROTOMY  04/08/2018   with end ileostomy takedown and creation of loop ileostomy-- [facility] [place] N/A 01/08/2016   Procedure: Beryle Quant;  Surgeon: [name], MD;  Location: [facility];  Service: Gastroenterology;  Laterality: N/A;  needs MAC due to pain and anxiety   ILEOSTOMY N/A 05/25/2017   Procedure: DILATION OF ILEOSTOMY;  Surgeon: Rolm Bookbinder, MD;  Location: [facility];  Service: General;  Laterality: N/A;   ILEOSTOMY  02/11/2016   END ileostomy.  Dr Hassell Done   ILEOSTOMY CLOSURE N/A 06/02/2017   Procedure: RESECTION OF ILEAL STRICTURE; ILEOSTOMY REVISION ;  Surgeon: Michael Boston, MD;  Location: [facility];  Service: General;  Laterality: N/A;   ILEOSTOMY DILATION  05/25/2017   DILITATION OF ILEAL STRICTURE   ileostomy stricture resection  06/02/2017  with revision   INCISION AND DRAINAGE ABSCESS  2018   INCISION AND DRAINAGE PERIRECTAL ABSCESS N/A 09/28/2013   Procedure: IRRIGATION AND DEBRIDEMENT PERIANAL ABSCESS, proctoscopy;  Surgeon: Shann Medal, MD;  Location: [facility];  Service: General;  Laterality: N/A;   INCISION AND DRAINAGE PERIRECTAL ABSCESS N/A 01/22/2016   Procedure: IRRIGATION AND DEBRIDEMENT PERIRECTAL ABSCESS;  Surgeon: Armandina Gemma, MD;  Location: [facility];  Service: General;  Laterality: N/A;   INNER EAR SURGERY Bilateral    LAPAROSCOPIC DIVERTED COLOSTOMY N/A 02/11/2016   Procedure: LAPAROSCOPIC DIVERTED ILEOSTOMY;  Surgeon: Johnathan Hausen, MD;  Location: [facility];  Service: General;  Laterality: N/A;   LAPAROSCOPY N/A 03/23/2017   Procedure: LAPAROSCOPY DIAGNOSTIC, EXPLORATORY LAPAROTOMY,  BOWEL RESECTION;  Surgeon: Jackolyn Confer,  MD;  Location: [facility];  Service: General;  Laterality: N/A;    I have reviewed the social history and family history with the patient and they are unchanged from previous note.  ALLERGIES:  is allergic to nsaids, codeine, mesalamine, oxycodone, lactose intolerance (gi), norco [hydrocodone-acetaminophen], other, and sulfa antibiotics.  MEDICATIONS:  Current Outpatient Medications  Medication Sig Dispense Refill   FLUoxetine (PROZAC) 20 MG capsule Take 1 capsule (20 mg total) by mouth daily. 90 capsule 1   acetaminophen (TYLENOL) 500 MG tablet You can take 2 tablets every 6 hours as needed for pain.  Use this as your primary pain control.  Use Tramadol as a last resort.  You can buy this over the counter at any drug store.    DO NOT TAKE MORE THAN 4000 MG OF TYLENOL PER DAY.  IT CAN HARM YOUR LIVER. 30 tablet 0   ALPRAZolam (XANAX) 0.25 MG tablet Take 1-2 tablets (0.25-0.5 mg total) by mouth 2 (two) times daily as needed for anxiety. 60 tablet 0   cyanocobalamin (,VITAMIN B-12,) 1000 MCG/ML injection As directed (Patient taking differently: 1,000 mcg every 30 (thirty) days. First Sunday of each month) 3 mL 11   diphenoxylate-atropine (LOMOTIL) 2.5-0.025 MG tablet Take 1-2 tablets by mouth 3-4 times daily AS NEEDED for severe diarrhea 240 tablet 6   famotidine (PEPCID) 20 MG tablet Take 1 tablet (20 mg total) by mouth 2 (two) times daily. (Patient taking differently: Take 20 mg by mouth daily as needed for indigestion.) 180 tablet 6   fluticasone (FLONASE) 50 MCG/ACT nasal spray Place 2 sprays into both nostrils daily. (Patient taking differently: Place 2 sprays into both nostrils daily as needed for rhinitis.) 16 g 6   loperamide (IMODIUM) 2 MG capsule Take 1 capsule (2 mg total) by mouth as needed for diarrhea or loose stools. 90 capsule 1   ondansetron (ZOFRAN-ODT) 4 MG disintegrating tablet DISSOLVE ONE TABLET BY MOUTH EVERY 6 HOURS AS NEEDED FOR NAUSEA AND VOMITING 90 tablet 6   predniSONE  (DELTASONE) 5 MG tablet TAKE 1 TABLET(5 MG) BY MOUTH DAILY 90 tablet 0   zolpidem (AMBIEN) 10 MG tablet Take 1 tablet (10 mg total) by mouth at bedtime as needed for sleep. 90 tablet 0   No current facility-administered medications for this visit.    PHYSICAL EXAMINATION: ECOG PERFORMANCE STATUS: 2 - Symptomatic, <50% confined to bed  Vitals:   02/27/22 1326  BP: 133/80  Pulse: 70  Resp: 18  Temp: 98.3 F (36.8 C)  SpO2: 100%   Wt Readings from Last 3 Encounters:  02/27/22 156 lb 3.2 oz (70.9 kg)  01/02/22 155 lb 9.6 oz (70.6 kg)  11/07/21 155 lb 11.2 oz (70.6 kg)  GENERAL:alert, no distress and comfortable SKIN: skin color normal, no rashes or significant lesions EYES: normal, Conjunctiva are pink and non-injected, sclera clear  NEURO: alert & oriented x 3 with fluent speech  LABORATORY DATA:  I have reviewed the data as listed    Latest Ref Rng & Units 02/27/2022   12:56 PM 01/02/2022   12:03 PM 11/07/2021    1:19 PM  CBC  WBC 4.0 - 10.5 K/uL 7.6  9.3  8.4   Hemoglobin 12.0 - 15.0 g/dL 14.5  14.6  14.5   Hematocrit 36.0 - 46.0 % 42.5  42.1  42.3   Platelets 150 - 400 K/uL 251  213  203         Latest Ref Rng & Units 02/27/2022   12:56 PM 02/25/2022    9:53 AM 01/02/2022   12:03 PM  CMP  Glucose 70 - 99 mg/dL 110   107   BUN 8 - 23 mg/dL 16   18   Creatinine 0.44 - 1.00 mg/dL 0.97  0.90  1.19   Sodium 135 - 145 mmol/L 134   133   Potassium 3.5 - 5.1 mmol/L 4.1   4.4   Chloride 98 - 111 mmol/L 104   102   CO2 22 - 32 mmol/L 23   23   Calcium 8.9 - 10.3 mg/dL 9.1   9.1   Total Protein 6.5 - 8.1 g/dL 6.9   6.8   Total Bilirubin 0.3 - 1.2 mg/dL 0.6   0.9   Alkaline Phos 38 - 126 U/L 75   85   AST 15 - 41 U/L 14   14   ALT 0 - 44 U/L 8   7       RADIOGRAPHIC STUDIES: I have personally reviewed the radiological images as listed and agreed with the findings in the report. No results found.    Orders Placed This Encounter  Procedures   Vitamin B12     Standing Status:   Standing    Number of Occurrences:   5    Standing Expiration Date:   02/28/2023   All questions were answered. The patient knows to call the clinic with any problems, questions or concerns. No barriers to learning was detected. The total time spent in the appointment was 30 minutes.     Truitt Merle, MD 02/27/2022   I, Wilburn Mylar, am acting as scribe for Truitt Merle, MD.   I have reviewed the above documentation for accuracy and completeness, and I agree with the above.

## 2022-02-28 ENCOUNTER — Telehealth: Payer: Self-pay | Admitting: Hematology

## 2022-02-28 LAB — CANCER ANTIGEN 27.29: CA 27.29: 33.8 U/mL (ref 0.0–38.6)

## 2022-02-28 NOTE — Telephone Encounter (Signed)
Left patient a voicemail regarding upcoming appointments

## 2022-03-27 ENCOUNTER — Other Ambulatory Visit: Payer: Self-pay | Admitting: Internal Medicine

## 2022-03-27 ENCOUNTER — Other Ambulatory Visit: Payer: Self-pay

## 2022-03-27 ENCOUNTER — Other Ambulatory Visit: Payer: Self-pay | Admitting: Medical Oncology

## 2022-03-27 ENCOUNTER — Inpatient Hospital Stay: Payer: Medicare HMO

## 2022-03-27 VITALS — BP 126/74 | HR 76 | Temp 98.0°F | Resp 18

## 2022-03-27 DIAGNOSIS — C50919 Malignant neoplasm of unspecified site of unspecified female breast: Secondary | ICD-10-CM

## 2022-03-27 DIAGNOSIS — C787 Secondary malignant neoplasm of liver and intrahepatic bile duct: Secondary | ICD-10-CM | POA: Diagnosis not present

## 2022-03-27 DIAGNOSIS — C792 Secondary malignant neoplasm of skin: Secondary | ICD-10-CM | POA: Diagnosis not present

## 2022-03-27 DIAGNOSIS — Z170 Estrogen receptor positive status [ER+]: Secondary | ICD-10-CM

## 2022-03-27 DIAGNOSIS — G4700 Insomnia, unspecified: Secondary | ICD-10-CM

## 2022-03-27 DIAGNOSIS — Z5111 Encounter for antineoplastic chemotherapy: Secondary | ICD-10-CM | POA: Diagnosis not present

## 2022-03-27 DIAGNOSIS — C7951 Secondary malignant neoplasm of bone: Secondary | ICD-10-CM | POA: Diagnosis not present

## 2022-03-27 DIAGNOSIS — E538 Deficiency of other specified B group vitamins: Secondary | ICD-10-CM

## 2022-03-27 DIAGNOSIS — C50312 Malignant neoplasm of lower-inner quadrant of left female breast: Secondary | ICD-10-CM | POA: Diagnosis not present

## 2022-03-27 DIAGNOSIS — R160 Hepatomegaly, not elsewhere classified: Secondary | ICD-10-CM

## 2022-03-27 MED ORDER — FULVESTRANT 250 MG/5ML IM SOSY
500.0000 mg | PREFILLED_SYRINGE | Freq: Once | INTRAMUSCULAR | Status: AC
  Administered 2022-03-27: 500 mg via INTRAMUSCULAR
  Filled 2022-03-27: qty 10

## 2022-03-27 MED ORDER — ALPRAZOLAM 0.25 MG PO TABS: 0.2500 mg | ORAL_TABLET | Freq: Two times a day (BID) | ORAL | 0 refills | Status: DC | PRN

## 2022-03-27 MED ORDER — ZOLPIDEM TARTRATE 10 MG PO TABS: 10.0000 mg | ORAL_TABLET | Freq: Every evening | ORAL | 0 refills | Status: DC | PRN

## 2022-03-27 NOTE — Telephone Encounter (Signed)
Refills-Requested to refill  alprazolam and Ambien.

## 2022-03-28 ENCOUNTER — Telehealth: Payer: Self-pay | Admitting: Medical Oncology

## 2022-03-28 ENCOUNTER — Encounter: Payer: Self-pay | Admitting: Family Medicine

## 2022-03-28 ENCOUNTER — Ambulatory Visit (INDEPENDENT_AMBULATORY_CARE_PROVIDER_SITE_OTHER): Payer: Medicare HMO | Admitting: Family Medicine

## 2022-03-28 ENCOUNTER — Other Ambulatory Visit: Payer: Self-pay | Admitting: Hematology

## 2022-03-28 VITALS — BP 130/80 | HR 86 | Temp 98.7°F | Resp 18 | Ht 66.0 in | Wt 155.2 lb

## 2022-03-28 DIAGNOSIS — R3129 Other microscopic hematuria: Secondary | ICD-10-CM | POA: Diagnosis not present

## 2022-03-28 DIAGNOSIS — E538 Deficiency of other specified B group vitamins: Secondary | ICD-10-CM

## 2022-03-28 DIAGNOSIS — Z170 Estrogen receptor positive status [ER+]: Secondary | ICD-10-CM

## 2022-03-28 DIAGNOSIS — E871 Hypo-osmolality and hyponatremia: Secondary | ICD-10-CM | POA: Diagnosis not present

## 2022-03-28 DIAGNOSIS — R300 Dysuria: Secondary | ICD-10-CM

## 2022-03-28 DIAGNOSIS — K5090 Crohn's disease, unspecified, without complications: Secondary | ICD-10-CM | POA: Diagnosis not present

## 2022-03-28 DIAGNOSIS — G4700 Insomnia, unspecified: Secondary | ICD-10-CM

## 2022-03-28 LAB — POC URINALSYSI DIPSTICK (AUTOMATED)
Bilirubin, UA: NEGATIVE
Glucose, UA: NEGATIVE
Ketones, UA: NEGATIVE
Nitrite, UA: POSITIVE
Protein, UA: NEGATIVE
Spec Grav, UA: 1.02 (ref 1.010–1.025)
Urobilinogen, UA: 0.2 E.U./dL
pH, UA: 6 (ref 5.0–8.0)

## 2022-03-28 MED ORDER — ZOLPIDEM TARTRATE 10 MG PO TABS: 10.0000 mg | ORAL_TABLET | Freq: Every evening | ORAL | 0 refills | Status: DC | PRN

## 2022-03-28 MED ORDER — PREDNISONE 5 MG PO TABS: ORAL_TABLET | ORAL | 0 refills | Status: DC

## 2022-03-28 MED ORDER — ALPRAZOLAM 0.25 MG PO TABS: 0.2500 mg | ORAL_TABLET | Freq: Two times a day (BID) | ORAL | 0 refills | Status: DC | PRN

## 2022-03-28 NOTE — Telephone Encounter (Signed)
Refill requested  for Ambien and xanax yesterday.  The two rx printed yesterday and cannot be faxed. They were shredded.  Routed to Dr Burr Medico to escribe.

## 2022-03-28 NOTE — Patient Instructions (Signed)
Preventive Care 65 Years and Older, Female Preventive care refers to lifestyle choices and visits with your health care provider that can promote health and wellness. Preventive care visits are also called wellness exams. What can I expect for my preventive care visit? Counseling Your health care provider may ask you questions about your: Medical history, including: Past medical problems. Family medical history. Pregnancy and menstrual history. History of falls. Current health, including: Memory and ability to understand (cognition). Emotional well-being. Home life and relationship well-being. Sexual activity and sexual health. Lifestyle, including: Alcohol, nicotine or tobacco, and drug use. Access to firearms. Diet, exercise, and sleep habits. Work and work environment. Sunscreen use. Safety issues such as seatbelt and bike helmet use. Physical exam Your health care provider will check your: Height and weight. These may be used to calculate your BMI (body mass index). BMI is a measurement that tells if you are at a healthy weight. Waist circumference. This measures the distance around your waistline. This measurement also tells if you are at a healthy weight and may help predict your risk of certain diseases, such as type 2 diabetes and high blood pressure. Heart rate and blood pressure. Body temperature. Skin for abnormal spots. What immunizations do I need?  Vaccines are usually given at various ages, according to a schedule. Your health care provider will recommend vaccines for you based on your age, medical history, and lifestyle or other factors, such as travel or where you work. What tests do I need? Screening Your health care provider may recommend screening tests for certain conditions. This may include: Lipid and cholesterol levels. Hepatitis C test. Hepatitis B test. HIV (human immunodeficiency virus) test. STI (sexually transmitted infection) testing, if you are at  risk. Lung cancer screening. Colorectal cancer screening. Diabetes screening. This is done by checking your blood sugar (glucose) after you have not eaten for a while (fasting). Mammogram. Talk with your health care provider about how often you should have regular mammograms. BRCA-related cancer screening. This may be done if you have a family history of breast, ovarian, tubal, or peritoneal cancers. Bone density scan. This is done to screen for osteoporosis. Talk with your health care provider about your test results, treatment options, and if necessary, the need for more tests. Follow these instructions at home: Eating and drinking  Eat a diet that includes fresh fruits and vegetables, whole grains, lean protein, and low-fat dairy products. Limit your intake of foods with high amounts of sugar, saturated fats, and salt. Take vitamin and mineral supplements as recommended by your health care provider. Do not drink alcohol if your health care provider tells you not to drink. If you drink alcohol: Limit how much you have to 0-1 drink a day. Know how much alcohol is in your drink. In the [place] one drink equals one 12 oz bottle of beer (355 mL), one 5 oz glass of wine (148 mL), or one 1 oz glass of hard liquor (44 mL). Lifestyle Brush your teeth every morning and night with fluoride toothpaste. Floss one time each day. Exercise for at least 30 minutes 5 or more days each week. Do not use any products that contain nicotine or tobacco. These products include cigarettes, chewing tobacco, and vaping devices, such as e-cigarettes. If you need help quitting, ask your health care provider. Do not use drugs. If you are sexually active, practice safe sex. Use a condom or other form of protection in order to prevent STIs. Take aspirin only as told by   your health care provider. Make sure that you understand how much to take and what form to take. Work with your health care provider to find out whether it  is safe and beneficial for you to take aspirin daily. Ask your health care provider if you need to take a cholesterol-lowering medicine (statin). Find healthy ways to manage stress, such as: Meditation, yoga, or listening to music. Journaling. Talking to a trusted person. Spending time with friends and family. Minimize exposure to UV radiation to reduce your risk of skin cancer. Safety Always wear your seat belt while driving or riding in a vehicle. Do not drive: If you have been drinking alcohol. Do not ride with someone who has been drinking. When you are tired or distracted. While texting. If you have been using any mind-altering substances or drugs. Wear a helmet and other protective equipment during sports activities. If you have firearms in your house, make sure you follow all gun safety procedures. What's next? Visit your health care provider once a year for an annual wellness visit. Ask your health care provider how often you should have your eyes and teeth checked. Stay up to date on all vaccines. This information is not intended to replace advice given to you by your health care provider. Make sure you discuss any questions you have with your health care provider. Document Revised: 10/10/2020 Document Reviewed: 10/10/2020 Elsevier Patient Education  2023 [employer]  

## 2022-03-28 NOTE — Progress Notes (Signed)
Subjective:   By signing my name below, I, Carylon Perches, attest that this documentation has been prepared under the direction and in the presence of Ann Held DO 03/28/2022    Patient ID: [name], female    DOB: [birth year], 81 y.o.   MRN: [id number]  Chief Complaint  Patient presents with   Annual Exam    Pt states fasting     HPI Patient is in today for a comprehensive physical exam  She is requesting a refill of 5 mg of Prednisone.   She is currently taking 5 mg of Prednisone for her Chron's disease. She states that she has been waiting for about a week to receive a refill for her medication. She has not been able to be seen Dr.Prytle due to a heavy schedule.   She reports that she had some dysuria a few weeks ago and felt similar to cystitis. Symptoms are resolved but she is inquiring about having a urine test done.   She is currently taking an estrogen blockage once a month.   She is currently being seen by Dr.Feng regularly.   She states that she is no longer taking any pain medications.   She states that she is regularly following up with her specialist for her ear symptoms.   She denies having any fever, new muscle pain, joint pain , new moles, congestion, sinus pain, sore throat, chest pain, palpations, cough, SOB ,wheezing,n/v/d constipation, blood in stool, dysuria, frequency, hematuria, at this time  She reports no recent surgeries. She denies of any changes to her family medical history.  Colonoscopy was last completed on 03/17/2018 Dexa was last completed on 02/23/2013 Mammogram was last completed on 08/15/2020. She reports that she recently had a full body scan and was informed that she will not an updated mammogram.  She reports that she is UTD on the shingles and influenza vaccine. She is not UTD on the tetanus vaccine.  She states that she has been pretty active.  She regularly goes to dental exams and is due for an appointment.  She is not  UTD on vision exams.  Past Medical History:  Diagnosis Date   Allergy    Anemia    Anxiety    Arthritis    Basal cell carcinoma    Blood transfusion without reported diagnosis    Bowel perforation ([place])    Cancer of lower-inner quadrant of female breast ([place]) 11/30/2014   left   Cancer of lower-inner quadrant of left female breast ([place]) 11/30/2014   Clostridium difficile infection    Complication of anesthesia    anxiety post-op after ear surgeries   Crohn's disease of large intestine with abscess ([place])    Dental crowns present    recent root canal 11/2014   Depression    Family history of adverse reaction to anesthesia    pt's sister has hx. 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   Seasonal allergies    TMJ (temporomandibular joint syndrome)    Ulcerative colitis    Vitamin B12 deficiency     Past Surgical History:  Procedure Laterality  Date   ABDOMINAL HYSTERECTOMY     partial   APPENDECTOMY     AUGMENTATION MAMMAPLASTY Bilateral    BASAL CELL CARCINOMA EXCISION Left    nose   BREAST BIOPSY Left 2019   benign   BREAST ENHANCEMENT SURGERY     BREAST LUMPECTOMY Left 2016   BREAST LUMPECTOMY WITH RADIOACTIVE SEED AND SENTINEL LYMPH NODE BIOPSY Left 12/13/2014   Procedure: LEFT BREAST LUMPECTOMY WITH RADIOACTIVE SEED AND SENTINEL LYMPH NODE BIOPSY;  Surgeon: [name], MD;  Location: [facility];  Service: General;  Laterality: Left;   COLONOSCOPY WITH PROPOFOL  05/28/2011; 06/28/2014   EXPLORATORY LAPAROTOMY  04/08/2018   with end ileostomy takedown and creation of loop ileostomy-- [place]  N/A 01/08/2016   Procedure: Beryle Quant;  Surgeon: [name], MD;  Location: [facility];  Service: Gastroenterology;  Laterality: N/A;  needs MAC due to pain and anxiety   ILEOSTOMY N/A 05/25/2017   Procedure: DILATION OF ILEOSTOMY;  Surgeon: Rolm Bookbinder, MD;  Location: [facility];  Service: General;  Laterality: N/A;   ILEOSTOMY  02/11/2016   END ileostomy.  Dr Hassell Done   ILEOSTOMY CLOSURE N/A 06/02/2017   Procedure: RESECTION OF ILEAL STRICTURE; ILEOSTOMY REVISION ;  Surgeon: Michael Boston, MD;  Location: [facility];  Service: General;  Laterality: N/A;   ILEOSTOMY DILATION  05/25/2017   DILITATION OF ILEAL STRICTURE   ileostomy stricture resection  06/02/2017   with revision   INCISION AND DRAINAGE ABSCESS  2018   INCISION AND DRAINAGE PERIRECTAL ABSCESS N/A 09/28/2013   Procedure: IRRIGATION AND DEBRIDEMENT PERIANAL ABSCESS, proctoscopy;  Surgeon: Shann Medal, MD;  Location: [facility];  Service: General;  Laterality: N/A;   INCISION AND DRAINAGE PERIRECTAL ABSCESS N/A 01/22/2016   Procedure: IRRIGATION AND DEBRIDEMENT PERIRECTAL ABSCESS;  Surgeon: Armandina Gemma, MD;  Location: [facility];  Service: General;  Laterality: N/A;   INNER EAR SURGERY Bilateral    LAPAROSCOPIC DIVERTED COLOSTOMY N/A 02/11/2016   Procedure: LAPAROSCOPIC DIVERTED ILEOSTOMY;  Surgeon: Johnathan Hausen, MD;  Location: [facility];  Service: General;  Laterality: N/A;   LAPAROSCOPY N/A 03/23/2017   Procedure: LAPAROSCOPY DIAGNOSTIC, EXPLORATORY LAPAROTOMY,  BOWEL RESECTION;  Surgeon: Jackolyn Confer, MD;  Location: [facility];  Service: General;  Laterality: N/A;    Family History  Problem Relation Age of Onset   Prostate cancer Brother 51   Kidney disease Brother    Diabetes Brother    Brain cancer Father    Rheum arthritis Mother    Anesthesia problems Sister        post-op N/V   Emphysema Maternal Grandfather    Asthma Sister    Rheum arthritis Brother    Esophageal cancer Neg Hx    Breast cancer Neg  Hx    Rectal cancer Neg Hx    Stomach cancer Neg Hx    Colon cancer Neg Hx     Social History   Socioeconomic History   Marital status: Widowed    Spouse name: Not on file   Number of children: 1   Years of education: Not on file   Highest education level: Not on file  Occupational History   Occupation: Warden/ranger    Comment: retired at age 29  Tobacco Use   Smoking status: Never   Smokeless tobacco: Never  Vaping Use   Vaping Use: Never used  Substance and Sexual Activity   Alcohol use: Yes  Alcohol/week: 7.0 standard drinks of alcohol    Types: 7 Glasses of wine per week    Comment: wine daily   Drug use: No   Sexual activity: Not Currently    Partners: Male  Other Topics Concern   Not on file  Social History Narrative   Lives alone in own home   Right Handed    Drinks caffeine occasionally   Social Determinants of Health   Financial Resource Strain: Low Risk  (03/26/2021)   Overall Financial Resource Strain (CARDIA)    Difficulty of Paying Living Expenses: Not hard at all  Food Insecurity: No Food Insecurity (03/26/2021)   Hunger Vital Sign    Worried About Running Out of Food in the Last Year: Never true    Ran Out of Food in the Last Year: Never true  Transportation Needs: No Transportation Needs (03/26/2021)   PRAPARE - Hydrologist (Medical): No    Lack of Transportation (Non-Medical): No  Physical Activity: Sufficiently Active (03/26/2021)   Exercise Vital Sign    Days of Exercise per Week: 7 days    Minutes of Exercise per Session: 30 min  Stress: No Stress Concern Present (03/26/2021)   [place]    Feeling of Stress : Not at all  Social Connections: Moderately Isolated (03/26/2021)   Social Connection and Isolation Panel [NHANES]    Frequency of Communication with Friends and Family: More than three times a week    Frequency of Social Gatherings  with Friends and Family: Once a week    Attends Religious Services: More than 4 times per year    Active Member of [employer] or Organizations: No    Attends Archivist Meetings: Never    Marital Status: Widowed  Intimate Partner Violence: Not At Risk (03/26/2021)   Humiliation, Afraid, Rape, and Kick questionnaire    Fear of Current or Ex-Partner: No    Emotionally Abused: No    Physically Abused: No    Sexually Abused: No    Outpatient Medications Prior to Visit  Medication Sig Dispense Refill   acetaminophen (TYLENOL) 500 MG tablet You can take 2 tablets every 6 hours as needed for pain.  Use this as your primary pain control.  Use Tramadol as a last resort.  You can buy this over the counter at any drug store.    DO NOT TAKE MORE THAN 4000 MG OF TYLENOL PER DAY.  IT CAN HARM YOUR LIVER. 30 tablet 0   ALPRAZolam (XANAX) 0.25 MG tablet Take 1-2 tablets (0.25-0.5 mg total) by mouth 2 (two) times daily as needed for anxiety. 60 tablet 0   cyanocobalamin (VITAMIN B12) 1000 MCG/ML injection INJECT 1ML INTO THE MUSCLE ONCE PER MONTH 3 mL 11   diphenoxylate-atropine (LOMOTIL) 2.5-0.025 MG tablet Take 1-2 tablets by mouth 3-4 times daily AS NEEDED for severe diarrhea 240 tablet 6   famotidine (PEPCID) 20 MG tablet Take 1 tablet (20 mg total) by mouth 2 (two) times daily. (Patient taking differently: Take 20 mg by mouth daily as needed for indigestion.) 180 tablet 6   FLUoxetine (PROZAC) 20 MG capsule Take 1 capsule (20 mg total) by mouth daily. 90 capsule 1   fluticasone (FLONASE) 50 MCG/ACT nasal spray Place 2 sprays into both nostrils daily. (Patient taking differently: Place 2 sprays into both nostrils daily as needed for rhinitis.) 16 g 6   loperamide (IMODIUM) 2 MG capsule Take 1 capsule (  2 mg total) by mouth as needed for diarrhea or loose stools. 90 capsule 1   ondansetron (ZOFRAN-ODT) 4 MG disintegrating tablet DISSOLVE ONE TABLET BY MOUTH EVERY 6 HOURS AS NEEDED FOR NAUSEA AND  VOMITING 90 tablet 6   zolpidem (AMBIEN) 10 MG tablet Take 1 tablet (10 mg total) by mouth at bedtime as needed for sleep. 30 tablet 0   predniSONE (DELTASONE) 5 MG tablet TAKE 1 TABLET(5 MG) BY MOUTH DAILY 90 tablet 0   No facility-administered medications prior to visit.    Allergies  Allergen Reactions   Nsaids Other (See Comments)    Crohn's disease = NO NSAIDs   Codeine Nausea And Vomiting   Mesalamine Nausea And Vomiting   Oxycodone Itching and Rash   Lactose Intolerance (Gi) Diarrhea   Norco [Hydrocodone-Acetaminophen] Nausea Only   Other Itching    PERFUMED LOTIONS  Cant have MRI due to ear implant    Sulfa Antibiotics Rash    Review of Systems  Constitutional:  Negative for fever.  HENT:  Negative for congestion, sinus pain and sore throat.   Respiratory:  Negative for cough, shortness of breath and wheezing.   Cardiovascular:  Negative for chest pain and palpitations.  Gastrointestinal:  Negative for blood in stool, constipation, diarrhea, nausea and vomiting.  Genitourinary:  Negative for dysuria, frequency and hematuria.  Musculoskeletal:  Negative for joint pain and myalgias.  Skin:        (-) New Moles       Objective:    Physical Exam Vitals and nursing note reviewed.  Constitutional:      General: She is not in acute distress.    Appearance: Normal appearance. She is not ill-appearing.  HENT:     Head: Normocephalic and atraumatic.     Right Ear: Tympanic membrane, ear canal and external ear normal.     Left Ear: Tympanic membrane, ear canal and external ear normal.  Eyes:     Extraocular Movements: Extraocular movements intact.     Pupils: Pupils are equal, round, and reactive to light.  Cardiovascular:     Rate and Rhythm: Normal rate and regular rhythm.     Heart sounds: Normal heart sounds. No murmur heard.    No gallop.  Pulmonary:     Effort: Pulmonary effort is normal. No respiratory distress.     Breath sounds: Normal breath sounds. No  wheezing or rales.  Abdominal:     General: Bowel sounds are normal. There is no distension.     Palpations: Abdomen is soft.     Tenderness: There is no abdominal tenderness. There is no guarding.  Skin:    General: Skin is warm and dry.  Neurological:     Mental Status: She is alert and oriented to person, place, and time.  Psychiatric:        Judgment: Judgment normal.     BP 130/80 (BP Location: Right Arm, Patient Position: Sitting, Cuff Size: Normal)   Pulse 86   Temp 98.7 F (37.1 C) (Oral)   Resp 18   Ht _0  (1.676 m)   Wt 155 lb 3.2 oz (70.4 kg)   SpO2 98%   BMI 25.05 kg/m  Wt Readings from Last 3 Encounters:  03/28/22 155 lb 3.2 oz (70.4 kg)  02/27/22 156 lb 3.2 oz (70.9 kg)  01/02/22 155 lb 9.6 oz (70.6 kg)    Diabetic Foot Exam - Simple   No data filed    Lab Results  Component  Value Date   WBC 7.6 02/27/2022   HGB 14.5 02/27/2022   HCT 42.5 02/27/2022   PLT 251 02/27/2022   GLUCOSE 110 (H) 02/27/2022   CHOL 188 03/26/2020   TRIG 203.0 (H) 03/26/2020   HDL 67.80 03/26/2020   LDLDIRECT 100.0 03/26/2020   LDLCALC 70 10/02/2016   ALT 8 02/27/2022   AST 14 (L) 02/27/2022   NA 134 (L) 02/27/2022   K 4.1 02/27/2022   CL 104 02/27/2022   CREATININE 0.97 02/27/2022   BUN 16 02/27/2022   CO2 23 02/27/2022   TSH 1.286 04/26/2021   INR 1.0 04/23/2021   HGBA1C 5.5 11/12/2020   MICROALBUR <0.7 01/30/2015    Lab Results  Component Value Date   TSH 1.286 04/26/2021   Lab Results  Component Value Date   WBC 7.6 02/27/2022   HGB 14.5 02/27/2022   HCT 42.5 02/27/2022   MCV 93.6 02/27/2022   PLT 251 02/27/2022   Lab Results  Component Value Date   NA 134 (L) 02/27/2022   K 4.1 02/27/2022   CHLORIDE 106 12/06/2014   CO2 23 02/27/2022   GLUCOSE 110 (H) 02/27/2022   BUN 16 02/27/2022   CREATININE 0.97 02/27/2022   BILITOT 0.6 02/27/2022   ALKPHOS 75 02/27/2022   AST 14 (L) 02/27/2022   ALT 8 02/27/2022   PROT 6.9 02/27/2022   ALBUMIN 3.9  02/27/2022   CALCIUM 9.1 02/27/2022   ANIONGAP 7 02/27/2022   EGFR 78 (L) 12/06/2014   GFR 64.16 06/11/2021   Lab Results  Component Value Date   CHOL 188 03/26/2020   Lab Results  Component Value Date   HDL 67.80 03/26/2020   Lab Results  Component Value Date   LDLCALC 70 10/02/2016   Lab Results  Component Value Date   TRIG 203.0 (H) 03/26/2020   Lab Results  Component Value Date   CHOLHDL 3 03/26/2020   Lab Results  Component Value Date   HGBA1C 5.5 11/12/2020       Assessment & Plan:   Problem List Items Addressed This Visit       Unprioritized   Hyponatremia   Relevant Orders   Lipid panel   Crohn disease ([place]) - Primary   Relevant Medications   predniSONE (DELTASONE) 5 MG tablet   Other Relevant Orders   Lipid panel   Other Visit Diagnoses     B12 deficiency       Relevant Orders   Lipid panel   Dysuria       Relevant Orders   POCT Urinalysis Dipstick (Automated) (Completed)   Urine Culture   Microscopic hematuria       Relevant Orders   Urine Culture      Meds ordered this encounter  Medications   predniSONE (DELTASONE) 5 MG tablet    Sig: 1 po qd    Dispense:  90 tablet    Refill:  0    I, Ann Held, DO, personally preformed the services described in this documentation.  All medical record entries made by the scribe were at my direction and in my presence.  I have reviewed the chart and discharge instructions (if applicable) and agree that the record reflects my personal performance and is accurate and complete. 03/28/2022   I,Amber Collins,acting as a scribe for Ann Held, DO.,have documented all relevant documentation on the behalf of Ann Held, DO,as directed by  Ann Held, DO while in the presence of  Ann Held, DO.    Ann Held, DO

## 2022-03-29 LAB — LIPID PANEL
Cholesterol: 195 mg/dL (ref <200)
HDL: 70 mg/dL (ref >=50)
LDL Cholesterol (Calc): 101 mg/dL (calc) — ABNORMAL HIGH
Non-HDL Cholesterol (Calc): 125 mg/dL (calc) (ref <130)
Total CHOL/HDL Ratio: 2.8 (calc) (ref <5.0)
Triglycerides: 141 mg/dL (ref <150)

## 2022-03-31 LAB — URINE CULTURE
MICRO NUMBER:: 14258080
SPECIMEN QUALITY:: ADEQUATE

## 2022-04-02 MED ORDER — CEPHALEXIN 500 MG PO CAPS: 500.0000 mg | ORAL_CAPSULE | Freq: Two times a day (BID) | ORAL | 0 refills | Status: DC

## 2022-04-02 NOTE — Addendum Note (Signed)
Addended byDamita Dunnings D on: 04/02/2022 03:09 PM   Modules accepted: Orders

## 2022-04-16 ENCOUNTER — Telehealth: Payer: Self-pay | Admitting: Family Medicine

## 2022-04-16 NOTE — Telephone Encounter (Signed)
No answer unable to leave a message for patient to call back and schedule Medicare Annual Wellness Visit (AWV) in office.   Please offer to do virtually or by telephone.   Last AWV: 03/26/2021   Please schedule at any time with [facility] 2  30 minute appointment for Virtual or phone  45 minute appointment for Initial virtual/phone  Any questions, please contact me at [phone]   Thank you,   [name]  Ambulatory Clinical Support for Care Management [place] [facility] ??[phone] or ??[phone]

## 2022-04-23 ENCOUNTER — Telehealth: Payer: Self-pay | Admitting: *Deleted

## 2022-04-23 NOTE — Telephone Encounter (Signed)
LMOM for pt to schedule AWV.

## 2022-04-24 ENCOUNTER — Other Ambulatory Visit: Payer: Self-pay

## 2022-04-24 ENCOUNTER — Inpatient Hospital Stay: Payer: Medicare HMO | Attending: Hematology

## 2022-04-24 VITALS — BP 133/81 | HR 77 | Temp 98.5°F | Resp 17

## 2022-04-24 DIAGNOSIS — C50312 Malignant neoplasm of lower-inner quadrant of left female breast: Secondary | ICD-10-CM | POA: Diagnosis not present

## 2022-04-24 DIAGNOSIS — C787 Secondary malignant neoplasm of liver and intrahepatic bile duct: Secondary | ICD-10-CM | POA: Insufficient documentation

## 2022-04-24 DIAGNOSIS — Z170 Estrogen receptor positive status [ER+]: Secondary | ICD-10-CM | POA: Diagnosis not present

## 2022-04-24 DIAGNOSIS — R160 Hepatomegaly, not elsewhere classified: Secondary | ICD-10-CM

## 2022-04-24 DIAGNOSIS — C7951 Secondary malignant neoplasm of bone: Secondary | ICD-10-CM | POA: Insufficient documentation

## 2022-04-24 DIAGNOSIS — Z5111 Encounter for antineoplastic chemotherapy: Secondary | ICD-10-CM | POA: Diagnosis not present

## 2022-04-24 DIAGNOSIS — C50919 Malignant neoplasm of unspecified site of unspecified female breast: Secondary | ICD-10-CM

## 2022-04-24 DIAGNOSIS — C7982 Secondary malignant neoplasm of genital organs: Secondary | ICD-10-CM | POA: Insufficient documentation

## 2022-04-24 MED ORDER — FULVESTRANT 250 MG/5ML IM SOSY
500.0000 mg | PREFILLED_SYRINGE | Freq: Once | INTRAMUSCULAR | Status: AC
  Administered 2022-04-24: 500 mg via INTRAMUSCULAR

## 2022-04-24 NOTE — Patient Instructions (Signed)

## 2022-05-07 ENCOUNTER — Other Ambulatory Visit: Payer: Self-pay | Admitting: Hematology

## 2022-05-07 ENCOUNTER — Other Ambulatory Visit: Payer: Self-pay

## 2022-05-07 ENCOUNTER — Telehealth: Payer: Self-pay

## 2022-05-07 DIAGNOSIS — G4700 Insomnia, unspecified: Secondary | ICD-10-CM

## 2022-05-07 DIAGNOSIS — Z170 Estrogen receptor positive status [ER+]: Secondary | ICD-10-CM

## 2022-05-07 NOTE — Telephone Encounter (Signed)
Pt called requesting a refill on her Xanax and Ambien.  Notified Dr. Burr Medico of the pt's request.

## 2022-05-23 ENCOUNTER — Inpatient Hospital Stay: Payer: Medicare HMO | Attending: Hematology

## 2022-05-23 ENCOUNTER — Other Ambulatory Visit: Payer: Self-pay

## 2022-05-23 ENCOUNTER — Inpatient Hospital Stay: Payer: Medicare HMO | Admitting: Hematology

## 2022-05-23 ENCOUNTER — Encounter: Payer: Self-pay | Admitting: Hematology

## 2022-05-23 ENCOUNTER — Inpatient Hospital Stay: Payer: Medicare HMO

## 2022-05-23 VITALS — BP 135/84 | HR 75 | Temp 99.2°F | Resp 17 | Wt 158.6 lb

## 2022-05-23 DIAGNOSIS — Z5111 Encounter for antineoplastic chemotherapy: Secondary | ICD-10-CM | POA: Diagnosis not present

## 2022-05-23 DIAGNOSIS — C792 Secondary malignant neoplasm of skin: Secondary | ICD-10-CM

## 2022-05-23 DIAGNOSIS — C50312 Malignant neoplasm of lower-inner quadrant of left female breast: Secondary | ICD-10-CM

## 2022-05-23 DIAGNOSIS — C787 Secondary malignant neoplasm of liver and intrahepatic bile duct: Secondary | ICD-10-CM | POA: Diagnosis not present

## 2022-05-23 DIAGNOSIS — F32A Depression, unspecified: Secondary | ICD-10-CM | POA: Diagnosis not present

## 2022-05-23 DIAGNOSIS — F419 Anxiety disorder, unspecified: Secondary | ICD-10-CM

## 2022-05-23 DIAGNOSIS — Z170 Estrogen receptor positive status [ER+]: Secondary | ICD-10-CM | POA: Diagnosis not present

## 2022-05-23 DIAGNOSIS — C7951 Secondary malignant neoplasm of bone: Secondary | ICD-10-CM | POA: Diagnosis not present

## 2022-05-23 DIAGNOSIS — E538 Deficiency of other specified B group vitamins: Secondary | ICD-10-CM

## 2022-05-23 DIAGNOSIS — C50919 Malignant neoplasm of unspecified site of unspecified female breast: Secondary | ICD-10-CM

## 2022-05-23 DIAGNOSIS — R160 Hepatomegaly, not elsewhere classified: Secondary | ICD-10-CM

## 2022-05-23 LAB — CBC WITH DIFFERENTIAL (CANCER CENTER ONLY)
Abs Immature Granulocytes: 0.03 10*3/uL (ref 0.00–0.07)
Basophils Absolute: 0 10*3/uL (ref 0.0–0.1)
Basophils Relative: 1 %
Eosinophils Absolute: 0 10*3/uL (ref 0.0–0.5)
Eosinophils Relative: 0 %
HCT: 40.3 % (ref 36.0–46.0)
Hemoglobin: 14 g/dL (ref 12.0–15.0)
Immature Granulocytes: 0 %
Lymphocytes Relative: 14 %
Lymphs Abs: 1 10*3/uL (ref 0.7–4.0)
MCH: 32.1 pg (ref 26.0–34.0)
MCHC: 34.7 g/dL (ref 30.0–36.0)
MCV: 92.4 fL (ref 80.0–100.0)
Monocytes Absolute: 0.5 10*3/uL (ref 0.1–1.0)
Monocytes Relative: 7 %
Neutro Abs: 5.7 10*3/uL (ref 1.7–7.7)
Neutrophils Relative %: 78 %
Platelet Count: 267 10*3/uL (ref 150–400)
RBC: 4.36 MIL/uL (ref 3.87–5.11)
RDW: 12.1 % (ref 11.5–15.5)
WBC Count: 7.2 10*3/uL (ref 4.0–10.5)
nRBC: 0 % (ref 0.0–0.2)

## 2022-05-23 LAB — CMP (CANCER CENTER ONLY)
ALT: 8 U/L (ref 0–44)
AST: 11 U/L — ABNORMAL LOW (ref 15–41)
Albumin: 3.6 g/dL (ref 3.5–5.0)
Alkaline Phosphatase: 75 U/L (ref 38–126)
Anion gap: 6 (ref 5–15)
BUN: 16 mg/dL (ref 8–23)
CO2: 25 mmol/L (ref 22–32)
Calcium: 8.7 mg/dL — ABNORMAL LOW (ref 8.9–10.3)
Chloride: 102 mmol/L (ref 98–111)
Creatinine: 1.01 mg/dL — ABNORMAL HIGH (ref 0.44–1.00)
GFR, Estimated: 56 mL/min — ABNORMAL LOW (ref >60)
Glucose, Bld: 95 mg/dL (ref 70–99)
Potassium: 3.7 mmol/L (ref 3.5–5.1)
Sodium: 133 mmol/L — ABNORMAL LOW (ref 135–145)
Total Bilirubin: 0.4 mg/dL (ref 0.3–1.2)
Total Protein: 6.4 g/dL — ABNORMAL LOW (ref 6.5–8.1)

## 2022-05-23 LAB — VITAMIN B12: Vitamin B-12: 399 pg/mL (ref 180–914)

## 2022-05-23 MED ORDER — FULVESTRANT 250 MG/5ML IM SOSY
500.0000 mg | PREFILLED_SYRINGE | Freq: Once | INTRAMUSCULAR | Status: AC
  Administered 2022-05-23: 500 mg via INTRAMUSCULAR
  Filled 2022-05-23: qty 10

## 2022-05-23 NOTE — Progress Notes (Signed)
[place]   [phone] Fax:([phone]) [phone]   Clinic Follow up Note   Patient Care Team: Carollee Herter, Alferd Apa, DO as PCP - General [name], MD as Consulting Physician (Breast Surgery) Pyrtle, Lajuan Lines, MD as Consulting Physician (Gastroenterology) Druscilla Brownie, MD as Referring Physician (Dermatology) Cindra Presume, MD as Consulting Physician (Plastic Surgery) Truitt Merle, MD as Consulting Physician (Hematology)  Date of Service:  05/23/2022  CHIEF COMPLAINT: f/u of  metastatic breast cancer   CURRENT THERAPY:  Fulvestrant injections, starting 05/15/21, now q4weeks    ASSESSMENT:  Sara Barnes is a 82 y.o. 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 Barry Dienes. Oncotype RS of 4 (low risk). She received adjuvant radiation. She attempted letrozole previously but did not tolerate due to colitis (pt has Crohn's). -she developed a rash in early 2022. Excisional biopsy by Dr. Claudia Desanctis on 07/25/20 confirmed invasive carcinoma, felt to represent recurrence of breast cancer. She declined further work up with [place] PET scan and was subsequently lost to f/u. -she developed liver and bone mets in 03/2021.  -Due to her colitis, she may not tolerate CDK4/6 inhibitor well (due to potential diarrhea). After a lengthy discussion, she agreed to fulvestrant injections. She received her first dose on 05/15/21. She has tolerated well. -baseline CA 27.29 06/14/21 was elevated at 375.9. This has been steadily decreasing on treatment -restaging CT CAP 02/25/22 showed good treatment response: improvement to liver lesions and bilateral pleural effusions, resolution of LLQ nodule, stable osseous metastatic lesions. No evidence of new metastatic disease. -She continues to have excellent response  and do clinically well except for stable mild body aches and fatigue.  Her recent worsening diarrhea is likely related to her Crohn's disease, I do not think it is from her fulvestrant injection.  Labs reviewed, overall stable. We will proceed with fulvestrant injection and continue every 4 weeks.    Anxiety and depression -she is on 20 mg generic Prozac. She does not feel this is helping for her new anxiety surrounding her cancer. -I prescribed Xanax on 05/30/21 for her to use as needed. She is no longer taking daily. -she previously declined referral to counseling. -she was increased to 54m prozac in 06/2021. She tells me she is taking every other day. She reports difficulty sleeping, which I explained could be related to her not taking daily. I will called in 268mfor her and advised her to take daily.  Diarrhea and Crohn disease -Follow-up with Dr. PyHilarie FredricksonDue to her worsening diarrhea recently, I will reach out to Dr. PyHilarie Fredricksonnd his PA to see if they can see her sooner    PLAN: - I think her worsening diarrhea be related to her Crohn's disease. - I will message Dr PyHilarie Fredricksono see if he or his PA can see her sooner than scheduled appointment on 2/15  -she is otherwise doing well  - proceed with fulvestrant injection today and continue every month  -f/u in 2 months, will order restaging scan on next visit     SUMMARY OF ONCOLOGIC HISTORY: Oncology History Overview Note  [facility]  Staging form: Breast, AJCC 7th Edition     Clinical stage from 12/06/2014: Stage IA (T1c, N0, M0) - Unsigned  Pathologic stage from 12/13/2014: Stage IA (T1c, N0, cM0) - Unsigned      Cancer of lower-inner quadrant of left female breast ([place])  11/28/2014 Breast US   Highly suspicious 1.6 cm irregular shadowing mass at the site of palpable concern in the subareolar lower inner quadrant of the left breast at approximately 630.   11/29/2014 Receptors her2    Estrogen Receptor: 100%, POSITIVE, STRONG STAINING INTENSITY (PERFORMED MANUALLY) Progesterone Receptor: 95%, POSITIVE, STRONG STAINING INTENSITY (PERFORMED MANUALLY) Proliferation Marker Ki67: 10% (PERFORMED MANUALLY), Her2neu negative   11/29/2014 Initial Biopsy   Breast, left, needle core biopsy, subareolar about 6:30 - INVASIVE DUCTAL CARCINOMA, SEE COMMENT.   11/30/2014 Initial Diagnosis   Cancer of lower-inner quadrant of left female breast   12/13/2014 Pathology Results   Left breast lumpectomy showed a 1.7 cm invasive ductal carcinoma, grade 1, margins were negative, (+) LVI, 5 Sentinel lymph nodes were negative,   12/13/2014 Oncotype testing   RS 4, which predicts 10-year risk of distant recurrence 4% with tamoxifen alone.   02/05/2015 - 02/26/2015 Radiation Therapy   left breast radiaiton    03/18/2015 -  Anti-estrogen oral therapy   She tried letrozole for 2-3 weeks, could not tolerate and stopped.   07/25/2020 Pathology Results   FINAL MICROSCOPIC DIAGNOSIS: A. SKIN, LEFT CHEST, EXCISION: - Invasive carcinoma involving dermis and subcutaneous soft tissue, consistent with patient's clinical history of invasive ductal carcinoma, see comment - Deep and lateral resection margins are negative for carcinoma  Appears grade I  ADDENDUM: PROGNOSTIC INDICATOR RESULTS: Immunohistochemical and morphometric analysis performed manually The tumor cells are NEGATIVE for Her2 (1+). Estrogen Receptor: POSITIVE, 90%, STRONG STAINING Progesterone Receptor: POSITIVE, 95%, STRONG STAINING Proliferation Marker Ki-67: 5%    04/19/2021 Imaging   EXAM: CT ABDOMEN AND PELVIS WITH CONTRAST  IMPRESSION: 1. Multiple hypodense hepatic masses, as detailed above, highly suspicious for metastatic disease. Differential include infectious/inflammatory process. Further evaluation with MRI examination with contrast would be helpful. 2. Moderate left and small right pleural effusion. 3. Postsurgical  changes for prior hemicolectomy. No bowel obstruction, colitis or diverticulitis.   04/24/2021 Pathology Results   FINAL MICROSCOPIC DIAGNOSIS:   A. LIVER, BIOPSY:  -  Metastatic carcinoma  -  See comment   COMMENT:  By immunohistochemistry, the neoplastic cells are positive for  cytokeratin 7, ER (100%, strong), GATA3 and PAX8 (patchy weak) but negative for cytokeratin 20 and TTF-1.  Overall, the morphology and immunophenotype supports metastasis from the patient's known mammary carcinoma.   ADDENDUM:  By immunohistochemistry, HER-2 is EQUIVOCAL (2+).    ADDENDUM:  FLOURESCENCE IN-SITU HYBRIDIZATION RESULTS:  GROUP 5:   HER2 **NEGATIVE**    05/11/2021 Imaging   EXAM: CT CHEST WITH CONTRAST  IMPRESSION: 1. Bilateral pleural effusions and lower lobe compressive atelectasis, left greater than right. 2. Nonspecific right hilar lymphadenopathy. 3. Bony lytic lesions involving the left posterior fifth rib, left T5 transverse process, and left aspect of the T10 vertebral body, consistent with bony metastases. 4. Patchy right upper lobe ground-glass airspace disease, which may be inflammatory or infectious. 5. Slight decrease in size of the left lobe subcapsular liver hematoma. No evidence of active hemorrhage. 6. Stable liver hypodensities consistent with presumed metastatic disease. Please correlate with previous biopsy results.   05/13/2021 Pathology Results   Specimen Submitted:  A. PLEURAL FLUID, LEFT, THORACENTESIS:   FINAL MICROSCOPIC DIAGNOSIS:  - Atypical mesothelial cells present  - Chronic inflammation       INTERVAL HISTORY:  [name]  is here for a follow up of  metastatic breast cancer.  She was last seen by Dr. Truitt Merle on 05/15/2021 She presents to the clinic accompanied by family member. Pt reports of having a lot of diarrhea. She is taking the medication lomotil and imodium no more than x2 a day for it. Pt had the chills and a fever to follow. Pt took  ibuprofen for the fever and that's when the stomach issue started. Pt denied having pain when she had the fever. Pt reports of having an anxiety and Fatigue. She reports she does take her anti depressant medicine but she thinks its not helping. Pt had some nausea intermittent.    All other systems were reviewed with the patient and are negative.  MEDICAL HISTORY:  Past Medical History:  Diagnosis Date   Allergy    Anemia    Anxiety    Arthritis    Basal cell carcinoma    Blood transfusion without reported diagnosis    Bowel perforation ([place])    Cancer of lower-inner quadrant of female breast ([place]) 11/30/2014   left   Cancer of lower-inner quadrant of left female breast ([place]) 11/30/2014   Clostridium difficile infection    Complication of anesthesia    anxiety post-op after ear surgeries   Crohn's disease of large intestine with abscess ([place])    Dental crowns present    recent root canal 11/2014   Depression    Family history of adverse reaction to anesthesia    pt's sister has hx. 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   Seasonal allergies    TMJ (temporomandibular joint syndrome)    Ulcerative colitis    Vitamin B12 deficiency     SURGICAL HISTORY: Past Surgical History:  Procedure Laterality Date   ABDOMINAL HYSTERECTOMY     partial   APPENDECTOMY     AUGMENTATION MAMMAPLASTY Bilateral    BASAL CELL CARCINOMA EXCISION Left    nose   BREAST BIOPSY Left 2019   benign   BREAST ENHANCEMENT SURGERY     BREAST LUMPECTOMY Left 2016   BREAST LUMPECTOMY WITH RADIOACTIVE SEED AND SENTINEL LYMPH NODE BIOPSY Left 12/13/2014   Procedure: LEFT BREAST LUMPECTOMY WITH RADIOACTIVE  SEED AND SENTINEL LYMPH NODE BIOPSY;  Surgeon: [name], MD;  Location: [facility];  Service: General;  Laterality: Left;   COLONOSCOPY WITH PROPOFOL  05/28/2011; 06/28/2014   EXPLORATORY LAPAROTOMY  04/08/2018   with end ileostomy takedown and creation of loop ileostomy-- [facility] [place] N/A 01/08/2016   Procedure: Beryle Quant;  Surgeon: [name], MD;  Location: [facility];  Service: Gastroenterology;  Laterality: N/A;  needs MAC due to pain and anxiety   ILEOSTOMY N/A 05/25/2017   Procedure: DILATION OF ILEOSTOMY;  Surgeon: Rolm Bookbinder, MD;  Location: [facility];  Service: General;  Laterality: N/A;   ILEOSTOMY  02/11/2016   END ileostomy.  Dr Hassell Done   ILEOSTOMY CLOSURE N/A 06/02/2017   Procedure: RESECTION OF ILEAL STRICTURE; ILEOSTOMY REVISION ;  Surgeon: Michael Boston, MD;  Location: [facility];  Service: General;  Laterality: N/A;   ILEOSTOMY DILATION  05/25/2017   DILITATION OF ILEAL STRICTURE   ileostomy stricture resection  06/02/2017   with revision   INCISION AND DRAINAGE ABSCESS  2018   INCISION AND DRAINAGE PERIRECTAL ABSCESS N/A 09/28/2013   Procedure: IRRIGATION AND DEBRIDEMENT PERIANAL ABSCESS, proctoscopy;  Surgeon: Shann Medal, MD;  Location: [facility];  Service: General;  Laterality: N/A;   INCISION AND DRAINAGE PERIRECTAL ABSCESS N/A 01/22/2016   Procedure: IRRIGATION AND DEBRIDEMENT PERIRECTAL ABSCESS;  Surgeon: Armandina Gemma, MD;  [facility];  Service: General;  Laterality: N/A;   INNER EAR SURGERY Bilateral    LAPAROSCOPIC DIVERTED COLOSTOMY N/A 02/11/2016   Procedure: LAPAROSCOPIC DIVERTED ILEOSTOMY;  Surgeon: Johnathan Hausen, MD;  [facility];  Service: General;  Laterality: N/A;   LAPAROSCOPY N/A 03/23/2017   Procedure: LAPAROSCOPY DIAGNOSTIC, EXPLORATORY LAPAROTOMY,  BOWEL RESECTION;  Surgeon: Jackolyn Confer, MD;  Location[facility];  Service: General;   Laterality: N/A;    I have reviewed the social history and family history with the patient and they are unchanged from previous note.  ALLERGIES:  is allergic to nsaids, codeine, mesalamine, oxycodone, lactose intolerance (gi), norco [hydrocodone-acetaminophen], other, and sulfa antibiotics.  MEDICATIONS:  Current Outpatient Medications  Medication Sig Dispense Refill   acetaminophen (TYLENOL) 500 MG tablet You can take 2 tablets every 6 hours as needed for pain.  Use this as your primary pain control.  Use Tramadol as a last resort.  You can buy this over the counter at any drug store.    DO NOT TAKE MORE THAN 4000 MG OF TYLENOL PER DAY.  IT CAN HARM YOUR LIVER. 30 tablet 0   ALPRAZolam (XANAX) 0.25 MG tablet TAKE 1 TO 2 TABLETS(0.25 TO 0.5 MG) BY MOUTH TWICE DAILY AS NEEDED FOR ANXIETY 60 tablet 0   cephALEXin (KEFLEX) 500 MG capsule Take 1 capsule (500 mg total) by mouth 2 (two) times daily. 14 capsule 0   cyanocobalamin (VITAMIN B12) 1000 MCG/ML injection INJECT 1ML INTO THE MUSCLE ONCE PER MONTH 3 mL 11   diphenoxylate-atropine (LOMOTIL) 2.5-0.025 MG tablet Take 1-2 tablets by mouth 3-4 times daily AS NEEDED for severe diarrhea 240 tablet 6   famotidine (PEPCID) 20 MG tablet Take 1 tablet (20 mg total) by mouth 2 (two) times daily. (Patient taking differently: Take 20 mg by mouth daily as needed for indigestion.) 180 tablet 6   FLUoxetine (PROZAC) 20 MG capsule Take 1 capsule (20 mg total) by mouth daily. 90 capsule 1   fluticasone (FLONASE) 50 MCG/ACT nasal spray Place 2 sprays into both nostrils daily. (Patient taking differently: Place 2 sprays into both nostrils daily as needed for rhinitis.) 16 g 6   loperamide (IMODIUM) 2 MG capsule Take 1 capsule (2 mg total) by mouth as needed for diarrhea or loose stools. 90 capsule 1   ondansetron (ZOFRAN-ODT) 4 MG disintegrating tablet DISSOLVE ONE TABLET BY MOUTH EVERY 6 HOURS AS NEEDED FOR NAUSEA AND VOMITING 90 tablet 6   predniSONE  (DELTASONE) 5 MG tablet 1 po qd 90 tablet 0   zolpidem (AMBIEN) 10 MG tablet TAKE 1 TABLET(10 MG) BY MOUTH AT BEDTIME AS NEEDED FOR SLEEP 30 tablet 0   No current facility-administered medications for this visit.    PHYSICAL EXAMINATION: ECOG PERFORMANCE STATUS: 2 - Symptomatic, <50% confined to bed  Vitals:   05/23/22 1259  BP: 135/84  Pulse: 75  Resp: 17  Temp: 99.2 F (37.3 C)  SpO2: 99%   Wt Readings  from Last 3 Encounters:  05/23/22 158 lb 9.6 oz (71.9 kg)  03/28/22 155 lb 3.2 oz (70.4 kg)  02/27/22 156 lb 3.2 oz (70.9 kg)     GENERAL:alert, no distress and comfortable SKIN: skin color normal, no rashes or significant lesions EYES: normal, Conjunctiva are pink and non-injected, sclera clear  NEURO: alert & oriented x 3 with fluent speech NECK: (-) supple, thyroid normal size, non-tender, without nodularity LYMPH:(-)  no palpable lymphadenopathy in the cervical, axillary  ABDOMEN:( -)abdomen soft, (+) little tender and normal (-) bowel sounds LABORATORY DATA:  I have reviewed the data as listed    Latest Ref Rng & Units 05/23/2022   12:40 PM 02/27/2022   12:56 PM 01/02/2022   12:03 PM  CBC  WBC 4.0 - 10.5 K/uL 7.2  7.6  9.3   Hemoglobin 12.0 - 15.0 g/dL 14.0  14.5  14.6   Hematocrit 36.0 - 46.0 % 40.3  42.5  42.1   Platelets 150 - 400 K/uL 267  251  213         Latest Ref Rng & Units 05/23/2022   12:40 PM 02/27/2022   12:56 PM 02/25/2022    9:53 AM  CMP  Glucose 70 - 99 mg/dL 95  110    BUN 8 - 23 mg/dL 16  16    Creatinine 0.44 - 1.00 mg/dL 1.01  0.97  0.90   Sodium 135 - 145 mmol/L 133  134    Potassium 3.5 - 5.1 mmol/L 3.7  4.1    Chloride 98 - 111 mmol/L 102  104    CO2 22 - 32 mmol/L 25  23    Calcium 8.9 - 10.3 mg/dL 8.7  9.1    Total Protein 6.5 - 8.1 g/dL 6.4  6.9    Total Bilirubin 0.3 - 1.2 mg/dL 0.4  0.6    Alkaline Phos 38 - 126 U/L 75  75    AST 15 - 41 U/L 11  14    ALT 0 - 44 U/L 8  8        RADIOGRAPHIC STUDIES: I have personally  reviewed the radiological images as listed and agreed with the findings in the report. No results found.    No orders of the defined types were placed in this encounter.  All questions were answered. The patient knows to call the clinic with any problems, questions or concerns. No barriers to learning was detected. The total time spent in the appointment was 30 minutes.     Truitt Merle, MD 05/23/2022   I, Maurine Simmering, CMA, am acting as scribe for Truitt Merle, MD.   I have reviewed the above documentation for accuracy and completeness, and I agree with the above.

## 2022-05-23 NOTE — Assessment & Plan Note (Signed)
-  she is on 20 mg generic Prozac. She does not feel this is helping for her new anxiety surrounding her cancer. -I prescribed Xanax on 05/30/21 for her to use as needed. She is no longer taking daily. -she previously declined referral to counseling. -she was increased to 40mg  prozac in 06/2021. She tells me she is taking every other day. She reports difficulty sleeping, which I explained could be related to her not taking daily. I will called in 20mg  for her and advised her to take daily.

## 2022-05-23 NOTE — Assessment & Plan Note (Signed)
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 Barry Dienes. Oncotype RS of 4 (low risk). She received adjuvant radiation. She attempted letrozole previously but did not tolerate due to colitis (pt has Crohn's). -she developed a rash in early 2022. Excisional biopsy by Dr. Claudia Desanctis on 07/25/20 confirmed invasive carcinoma, felt to represent recurrence of breast cancer. She declined further work up with [place] PET scan and was subsequently lost to f/u. -she developed liver and bone mets in 03/2021.  -Due to her colitis, she may not tolerate CDK4/6 inhibitor well (due to potential diarrhea). After a lengthy discussion, she agreed to fulvestrant injections. She received her first dose on 05/15/21. She has tolerated well. -baseline CA 27.29 06/14/21 was elevated at 375.9. This has been steadily decreasing on treatment -restaging CT CAP 02/25/22 showed good treatment response: improvement to liver lesions and bilateral pleural effusions, resolution of LLQ nodule, stable osseous metastatic lesions. No evidence of new metastatic disease. -She continues to have excellent response and do clinically well except for stable mild body aches and fatigue. Labs reviewed, overall stable. We will proceed with fulvestrant injection and continue every 4 weeks.

## 2022-05-24 LAB — CANCER ANTIGEN 27.29: CA 27.29: 19.9 U/mL (ref 0.0–38.6)

## 2022-06-09 ENCOUNTER — Other Ambulatory Visit: Payer: Self-pay | Admitting: Hematology

## 2022-06-09 DIAGNOSIS — G4700 Insomnia, unspecified: Secondary | ICD-10-CM

## 2022-06-12 ENCOUNTER — Encounter: Payer: Self-pay | Admitting: Gastroenterology

## 2022-06-12 ENCOUNTER — Ambulatory Visit: Payer: Medicare HMO | Admitting: Gastroenterology

## 2022-06-12 VITALS — BP 118/62 | HR 67 | Ht 66.0 in | Wt 154.0 lb

## 2022-06-12 DIAGNOSIS — F418 Other specified anxiety disorders: Secondary | ICD-10-CM

## 2022-06-12 DIAGNOSIS — K50119 Crohn's disease of large intestine with unspecified complications: Secondary | ICD-10-CM

## 2022-06-12 DIAGNOSIS — I509 Heart failure, unspecified: Secondary | ICD-10-CM | POA: Diagnosis not present

## 2022-06-12 DIAGNOSIS — E538 Deficiency of other specified B group vitamins: Secondary | ICD-10-CM | POA: Diagnosis not present

## 2022-06-12 DIAGNOSIS — R4589 Other symptoms and signs involving emotional state: Secondary | ICD-10-CM | POA: Insufficient documentation

## 2022-06-12 MED ORDER — DIPHENOXYLATE-ATROPINE 2.5-0.025 MG PO TABS: ORAL_TABLET | ORAL | 6 refills | Status: DC

## 2022-06-12 MED ORDER — PREDNISONE 5 MG PO TABS: ORAL_TABLET | ORAL | 3 refills | Status: DC

## 2022-06-12 NOTE — Patient Instructions (Addendum)
_We have sent the following medications to your pharmacy for you to pick up at your convenience: Prednisone lomotil ______________________________________________________  If your blood pressure at your visit was 140/90 or greater, please contact your primary care physician to follow up on this.  _______________________________________________________  If you are age 81 or older, your body mass index should be between 23-30. Your Body mass index is 24.86 kg/m. If this is out of the aforementioned range listed, please consider follow up with your Primary Care Provider.  If you are age 85 or younger, your body mass index should be between 19-25. Your Body mass index is 24.86 kg/m. If this is out of the aformentioned range listed, please consider follow up with your Primary Care Provider.   ________________________________________________________  The [facility] providers would like to encourage you to use [facility] to communicate with providers for non-urgent requests or questions.  Due to long hold times on the telephone, sending your provider a message by [facility] may be a faster and more efficient way to get a response.  Please allow 48 business hours for a response.  Please remember that this is for non-urgent requests.  _______________________________________________________  Thank you for trusting me with your gastrointestinal care!   Alonza Bogus PA-C

## 2022-06-12 NOTE — Progress Notes (Signed)
06/12/2022 Sara Barnes [id number] 1940/10/07   HISTORY OF PRESENT ILLNESS:  Sara Barnes is a 82 year old female who is a patient of Dr. Vena Rua with a history of complicated Crohn's disease with perianal involvement, prior end ileostomy with subsequent takedown in June 2020, prior right hemicolectomy after cecal perforation in the setting of severe Crohn's disease, history of GERD with dyspepsia, insomnia and depression, history of Cdiff, and vitamin B12 deficiency who is here for follow-up.  Unfortunately she also has metastatic breast cancer for which she is under the care of Dr. Burr Medico and so are it looks like her treatments are helping.  She is here today for follow-up.  Her last visit here was in March 2023.  She has been following with Dr. Burr Medico closely.  She tells me that she was on antibiotics for a "kidney infection" within the past month or so.  She says that her diarrhea kicked up with that so she increased her prednisone to 10 mg daily on her own.  She has now been back to the 5 mg daily for the past couple of days or so.  That did seem to help her symptoms.  She reports always having diarrhea/loose stools and takes Lomotil.  She is here today requesting refills of her Lomotil and her prednisone.  No blood in her stool and no abdominal pain.  Her sister, Sara Barnes, is with her today.    Past Medical History:  Diagnosis Date   Allergy    Anemia    Anxiety    Arthritis    Basal cell carcinoma    Blood transfusion without reported diagnosis    Bowel perforation ([place])    Cancer of lower-inner quadrant of female breast ([place]) 11/30/2014   left   Cancer of lower-inner quadrant of left female breast ([place]) 11/30/2014   Clostridium difficile infection    Complication of anesthesia    anxiety post-op after ear surgeries   Crohn's disease of large intestine with abscess ([place])    Dental crowns present    recent root canal 11/2014   Depression    Family history of adverse reaction to  anesthesia    pt's sister has hx. 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   Seasonal allergies    TMJ (temporomandibular joint syndrome)    Ulcerative colitis    Vitamin B12 deficiency    Past Surgical History:  Procedure Laterality Date   ABDOMINAL HYSTERECTOMY     partial   APPENDECTOMY     AUGMENTATION MAMMAPLASTY Bilateral    BASAL CELL CARCINOMA EXCISION Left    nose   BREAST BIOPSY Left 2019   benign   BREAST ENHANCEMENT SURGERY     BREAST LUMPECTOMY Left 2016   BREAST LUMPECTOMY WITH RADIOACTIVE SEED AND SENTINEL LYMPH NODE BIOPSY Left 12/13/2014   Procedure: LEFT BREAST LUMPECTOMY WITH RADIOACTIVE SEED AND SENTINEL LYMPH NODE BIOPSY;  Surgeon: [name], MD;  Location: [facility];  Service: General;  Laterality: Left;   COLONOSCOPY WITH PROPOFOL  05/28/2011; 06/28/2014   EXPLORATORY LAPAROTOMY  04/08/2018   with end ileostomy takedown and creation of  loop ileostomy-- [facility] N/A 01/08/2016   Procedure: FLEXIBLE SIGMOIDOSCOPY;  Surgeon: [name], MD;  Location: [facility];  Service: Gastroenterology;  Laterality: N/A;  needs MAC due to pain and anxiety   ILEOSTOMY N/A 05/25/2017   Procedure: DILATION OF ILEOSTOMY;  Surgeon: Rolm Bookbinder, MD;  Location: [facility];  Service: General;  Laterality: N/A;   ILEOSTOMY  02/11/2016   END ileostomy.  Dr Hassell Done   ILEOSTOMY CLOSURE N/A 06/02/2017   Procedure: RESECTION OF ILEAL STRICTURE; ILEOSTOMY REVISION ;  Surgeon: Michael Boston, MD;  Location: [facility];  Service: General;  Laterality: N/A;   ILEOSTOMY DILATION  05/25/2017   DILITATION OF  ILEAL STRICTURE   ileostomy stricture resection  06/02/2017   with revision   INCISION AND DRAINAGE ABSCESS  2018   INCISION AND DRAINAGE PERIRECTAL ABSCESS N/A 09/28/2013   Procedure: IRRIGATION AND DEBRIDEMENT PERIANAL ABSCESS, proctoscopy;  Surgeon: Shann Medal, MD;  Location: [facility];  Service: General;  Laterality: N/A;   INCISION AND DRAINAGE PERIRECTAL ABSCESS N/A 01/22/2016   Procedure: IRRIGATION AND DEBRIDEMENT PERIRECTAL ABSCESS;  Surgeon: Armandina Gemma, MD;  Location: [facility];  Service: General;  Laterality: N/A;   INNER EAR SURGERY Bilateral    LAPAROSCOPIC DIVERTED COLOSTOMY N/A 02/11/2016   Procedure: LAPAROSCOPIC DIVERTED ILEOSTOMY;  Surgeon: Johnathan Hausen, MD;  Location: [facility];  Service: General;  Laterality: N/A;   LAPAROSCOPY N/A 03/23/2017   Procedure: LAPAROSCOPY DIAGNOSTIC, EXPLORATORY LAPAROTOMY,  BOWEL RESECTION;  Surgeon: Jackolyn Confer, MD;  Location: [facility];  Service: General;  Laterality: N/A;    reports that she has never smoked. She has never used smokeless tobacco. She reports current alcohol use of about 7.0 standard drinks of alcohol per week. She reports that she does not use drugs. family history includes Anesthesia problems in her sister; Asthma in her sister; Brain cancer in her father; Diabetes in her brother; Emphysema in her maternal grandfather; Kidney disease in her brother; Prostate cancer (age of onset: 60) in her brother; Rheum arthritis in her brother and mother. Allergies  Allergen Reactions   Nsaids Other (See Comments)    Crohn's disease = NO NSAIDs   Codeine Nausea And Vomiting   Mesalamine Nausea And Vomiting   Oxycodone Itching and Rash   Lactose Intolerance (Gi) Diarrhea   Norco [Hydrocodone-Acetaminophen] Nausea Only   Other Itching    PERFUMED LOTIONS  Cant have MRI due to ear implant    Sulfa Antibiotics Rash      Outpatient Encounter Medications as of 06/12/2022  Medication Sig   acetaminophen (TYLENOL) 500 MG tablet You can  take 2 tablets every 6 hours as needed for pain.  Use this as your primary pain control.  Use Tramadol as a last resort.  You can buy this over the counter at any drug store.    DO NOT TAKE MORE THAN 4000 MG OF TYLENOL PER DAY.  IT CAN HARM YOUR LIVER.   ALPRAZolam (XANAX) 0.25 MG tablet TAKE 1 TO 2 TABLETS(0.25 TO 0.5 MG) BY MOUTH TWICE DAILY AS NEEDED FOR ANXIETY   cephALEXin (KEFLEX) 500 MG capsule Take 1 capsule (500 mg total) by mouth 2 (two) times daily.   cyanocobalamin (VITAMIN B12) 1000 MCG/ML injection INJECT 1ML INTO THE MUSCLE ONCE PER MONTH   diphenoxylate-atropine (LOMOTIL) 2.5-0.025 MG tablet Take 1-2 tablets by mouth 3-4 times daily AS NEEDED for severe diarrhea   famotidine (PEPCID) 20 MG tablet Take 1 tablet (  20 mg total) by mouth 2 (two) times daily. (Patient taking differently: Take 20 mg by mouth daily as needed for indigestion.)   FLUoxetine (PROZAC) 20 MG capsule Take 1 capsule (20 mg total) by mouth daily.   fluticasone (FLONASE) 50 MCG/ACT nasal spray Place 2 sprays into both nostrils daily. (Patient taking differently: Place 2 sprays into both nostrils daily as needed for rhinitis.)   loperamide (IMODIUM) 2 MG capsule Take 1 capsule (2 mg total) by mouth as needed for diarrhea or loose stools.   ondansetron (ZOFRAN-ODT) 4 MG disintegrating tablet DISSOLVE ONE TABLET BY MOUTH EVERY 6 HOURS AS NEEDED FOR NAUSEA AND VOMITING   predniSONE (DELTASONE) 5 MG tablet 1 po qd   zolpidem (AMBIEN) 10 MG tablet TAKE 1 TABLET(10 MG) BY MOUTH AT BEDTIME AS NEEDED FOR SLEEP   No facility-administered encounter medications on file as of 06/12/2022.     REVIEW OF SYSTEMS  : All other systems reviewed and negative except where noted in the History of Present Illness.   PHYSICAL EXAM: BP 118/62   Pulse 67   Ht 5\' 6"  (1.676 m)   Wt 154 lb (69.9 kg)   SpO2 98%   BMI 24.86 kg/m  General: Well developed white female in no acute distress Head: Normocephalic and atraumatic Eyes:   Sclerae anicteric, conjunctiva pink. Ears: Normal auditory acuity Lungs: Clear throughout to auscultation; no W/R/R. Heart: Regular rate and rhythm; no M/R/G. Abdomen: Soft, non-distended.  BS present.  Scars noted from previous surgeries.  Non-tender. Musculoskeletal: Symmetrical with no gross deformities  Skin: No lesions on visible extremities Extremities: No edema  Neurological: Alert oriented x 4, grossly non-focal Psychological:  Alert and cooperative. Normal mood and affect  ASSESSMENT AND PLAN: 82 year old female with a history of complicated Crohn's disease with perianal involvement, prior end ileostomy with subsequent takedown in June 2020, prior right hemicolectomy after cecal perforation in the setting of severe Crohn's disease, history of GERD with dyspepsia, insomnia and depression who is here for follow-up.     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 Burr Medico.   2.  Anxiety exacerbated by cancer diagnosis/history of depression--still on fluoxetine 20 mg daily and alprazolam as needed that are now being prescribed by Dr. Burr Medico.   3.  Crohn's colitis with perianal disease --she remains only on low-dose prednisone 5 mg daily without desire to escalate therapy.  Escalation of therapy is not needed or recommended at this time given #1 --Continue prednisone 5 mg daily --Lomotil or loperamide as previously directed as needed for diarrhea   4.  Insomnia --chronic Ambien 10 mg nightly, also now being prescribed by Dr. Burr Medico.   5.  Nausea --ondansetron 4 mg every 6 hours as needed   6.  Dyspepsia --she has famotidine 20 mg which she can use every 12 hours as needed.  7. Vitamin B12 deficiency: Gets monthly injections.  **We will renew prescriptions for prednisone 5 mg daily and her Lomotil to be used as needed. **She was advised to contact our office with any worsening of her diarrhea at which time we would need to check a C.  difficile and consider fecal calprotectin as well since she is at risk of Cdiff with her history of such, IBD, and immunosuppressed state with prednisone and cancer treatment.    CC:  Ann Held, *

## 2022-06-16 ENCOUNTER — Other Ambulatory Visit: Payer: Self-pay | Admitting: Internal Medicine

## 2022-06-16 NOTE — Progress Notes (Signed)
Addendum: Reviewed and agree with assessment and management plan. Alaysiah Browder M, MD  

## 2022-06-20 ENCOUNTER — Other Ambulatory Visit: Payer: Self-pay

## 2022-06-20 ENCOUNTER — Inpatient Hospital Stay: Payer: Medicare HMO | Attending: Hematology

## 2022-06-20 VITALS — BP 146/80 | HR 71 | Temp 98.3°F | Resp 16

## 2022-06-20 DIAGNOSIS — C7951 Secondary malignant neoplasm of bone: Secondary | ICD-10-CM | POA: Diagnosis not present

## 2022-06-20 DIAGNOSIS — C787 Secondary malignant neoplasm of liver and intrahepatic bile duct: Secondary | ICD-10-CM | POA: Diagnosis not present

## 2022-06-20 DIAGNOSIS — Z170 Estrogen receptor positive status [ER+]: Secondary | ICD-10-CM | POA: Insufficient documentation

## 2022-06-20 DIAGNOSIS — C792 Secondary malignant neoplasm of skin: Secondary | ICD-10-CM | POA: Diagnosis not present

## 2022-06-20 DIAGNOSIS — R160 Hepatomegaly, not elsewhere classified: Secondary | ICD-10-CM

## 2022-06-20 DIAGNOSIS — C50312 Malignant neoplasm of lower-inner quadrant of left female breast: Secondary | ICD-10-CM | POA: Diagnosis not present

## 2022-06-20 DIAGNOSIS — C50919 Malignant neoplasm of unspecified site of unspecified female breast: Secondary | ICD-10-CM

## 2022-06-20 DIAGNOSIS — Z5111 Encounter for antineoplastic chemotherapy: Secondary | ICD-10-CM | POA: Insufficient documentation

## 2022-06-20 MED ORDER — FULVESTRANT 250 MG/5ML IM SOSY
500.0000 mg | PREFILLED_SYRINGE | Freq: Once | INTRAMUSCULAR | Status: AC
  Administered 2022-06-20: 500 mg via INTRAMUSCULAR
  Filled 2022-06-20: qty 10

## 2022-07-01 ENCOUNTER — Other Ambulatory Visit: Payer: Self-pay | Admitting: Hematology

## 2022-07-01 DIAGNOSIS — Z170 Estrogen receptor positive status [ER+]: Secondary | ICD-10-CM

## 2022-07-08 ENCOUNTER — Other Ambulatory Visit: Payer: Self-pay | Admitting: Hematology

## 2022-07-08 DIAGNOSIS — G4700 Insomnia, unspecified: Secondary | ICD-10-CM

## 2022-07-17 ENCOUNTER — Other Ambulatory Visit: Payer: Self-pay

## 2022-07-17 DIAGNOSIS — C50919 Malignant neoplasm of unspecified site of unspecified female breast: Secondary | ICD-10-CM

## 2022-07-17 DIAGNOSIS — R160 Hepatomegaly, not elsewhere classified: Secondary | ICD-10-CM

## 2022-07-17 DIAGNOSIS — Z170 Estrogen receptor positive status [ER+]: Secondary | ICD-10-CM

## 2022-07-18 ENCOUNTER — Encounter: Payer: Self-pay | Admitting: Hematology

## 2022-07-18 ENCOUNTER — Other Ambulatory Visit: Payer: Self-pay

## 2022-07-18 ENCOUNTER — Inpatient Hospital Stay: Payer: Medicare HMO | Admitting: Hematology

## 2022-07-18 ENCOUNTER — Inpatient Hospital Stay: Payer: Medicare HMO

## 2022-07-18 ENCOUNTER — Inpatient Hospital Stay: Payer: Medicare HMO | Attending: Hematology

## 2022-07-18 VITALS — BP 117/81 | HR 83 | Temp 97.8°F | Resp 15 | Ht 66.0 in | Wt 154.3 lb

## 2022-07-18 DIAGNOSIS — C50919 Malignant neoplasm of unspecified site of unspecified female breast: Secondary | ICD-10-CM

## 2022-07-18 DIAGNOSIS — Z5111 Encounter for antineoplastic chemotherapy: Secondary | ICD-10-CM | POA: Insufficient documentation

## 2022-07-18 DIAGNOSIS — R160 Hepatomegaly, not elsewhere classified: Secondary | ICD-10-CM

## 2022-07-18 DIAGNOSIS — F419 Anxiety disorder, unspecified: Secondary | ICD-10-CM | POA: Diagnosis not present

## 2022-07-18 DIAGNOSIS — C787 Secondary malignant neoplasm of liver and intrahepatic bile duct: Secondary | ICD-10-CM | POA: Insufficient documentation

## 2022-07-18 DIAGNOSIS — C50312 Malignant neoplasm of lower-inner quadrant of left female breast: Secondary | ICD-10-CM

## 2022-07-18 DIAGNOSIS — Z170 Estrogen receptor positive status [ER+]: Secondary | ICD-10-CM | POA: Diagnosis not present

## 2022-07-18 DIAGNOSIS — C792 Secondary malignant neoplasm of skin: Secondary | ICD-10-CM | POA: Insufficient documentation

## 2022-07-18 DIAGNOSIS — K50011 Crohn's disease of small intestine with rectal bleeding: Secondary | ICD-10-CM | POA: Diagnosis not present

## 2022-07-18 DIAGNOSIS — K50119 Crohn's disease of large intestine with unspecified complications: Secondary | ICD-10-CM | POA: Diagnosis not present

## 2022-07-18 DIAGNOSIS — F32A Depression, unspecified: Secondary | ICD-10-CM

## 2022-07-18 DIAGNOSIS — C7951 Secondary malignant neoplasm of bone: Secondary | ICD-10-CM | POA: Insufficient documentation

## 2022-07-18 LAB — CBC WITH DIFFERENTIAL (CANCER CENTER ONLY)
Abs Immature Granulocytes: 0.07 10*3/uL (ref 0.00–0.07)
Basophils Absolute: 0.1 10*3/uL (ref 0.0–0.1)
Basophils Relative: 1 %
Eosinophils Absolute: 0.1 10*3/uL (ref 0.0–0.5)
Eosinophils Relative: 1 %
HCT: 41.1 % (ref 36.0–46.0)
Hemoglobin: 14.5 g/dL (ref 12.0–15.0)
Immature Granulocytes: 1 %
Lymphocytes Relative: 11 %
Lymphs Abs: 1.2 10*3/uL (ref 0.7–4.0)
MCH: 32.5 pg (ref 26.0–34.0)
MCHC: 35.3 g/dL (ref 30.0–36.0)
MCV: 92.2 fL (ref 80.0–100.0)
Monocytes Absolute: 0.6 10*3/uL (ref 0.1–1.0)
Monocytes Relative: 5 %
Neutro Abs: 8.8 10*3/uL — ABNORMAL HIGH (ref 1.7–7.7)
Neutrophils Relative %: 81 %
Platelet Count: 242 10*3/uL (ref 150–400)
RBC: 4.46 MIL/uL (ref 3.87–5.11)
RDW: 12 % (ref 11.5–15.5)
WBC Count: 10.7 10*3/uL — ABNORMAL HIGH (ref 4.0–10.5)
nRBC: 0 % (ref 0.0–0.2)

## 2022-07-18 LAB — CMP (CANCER CENTER ONLY)
ALT: 10 U/L (ref 0–44)
AST: 13 U/L — ABNORMAL LOW (ref 15–41)
Albumin: 4 g/dL (ref 3.5–5.0)
Alkaline Phosphatase: 64 U/L (ref 38–126)
Anion gap: 7 (ref 5–15)
BUN: 15 mg/dL (ref 8–23)
CO2: 25 mmol/L (ref 22–32)
Calcium: 9.1 mg/dL (ref 8.9–10.3)
Chloride: 99 mmol/L (ref 98–111)
Creatinine: 1.06 mg/dL — ABNORMAL HIGH (ref 0.44–1.00)
GFR, Estimated: 53 mL/min — ABNORMAL LOW (ref >60)
Glucose, Bld: 112 mg/dL — ABNORMAL HIGH (ref 70–99)
Potassium: 4.3 mmol/L (ref 3.5–5.1)
Sodium: 131 mmol/L — ABNORMAL LOW (ref 135–145)
Total Bilirubin: 0.7 mg/dL (ref 0.3–1.2)
Total Protein: 6.6 g/dL (ref 6.5–8.1)

## 2022-07-18 MED ORDER — FULVESTRANT 250 MG/5ML IM SOSY
500.0000 mg | PREFILLED_SYRINGE | Freq: Once | INTRAMUSCULAR | Status: AC
  Administered 2022-07-18: 500 mg via INTRAMUSCULAR
  Filled 2022-07-18: qty 10

## 2022-07-18 MED ORDER — ONDANSETRON 4 MG PO TBDP: ORAL_TABLET | ORAL | 3 refills | Status: DC

## 2022-07-18 NOTE — Assessment & Plan Note (Signed)
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 Barry Dienes. Oncotype RS of 4 (low risk). She received adjuvant radiation. She attempted letrozole previously but did not tolerate due to colitis (pt has Crohn's). -she developed a rash in early 2022. Excisional biopsy by Dr. Claudia Desanctis on 07/25/20 confirmed invasive carcinoma, felt to represent recurrence of breast cancer. She declined further work up with [place] PET scan and was subsequently lost to f/u. -she developed liver and bone mets in 03/2021.  -Due to her colitis, she may not tolerate CDK4/6 inhibitor well (due to potential diarrhea). After a lengthy discussion, she agreed to fulvestrant injections. She received her first dose on 05/15/21. She has tolerated well. -baseline CA 27.29 06/14/21 was elevated at 375.9. This has been steadily decreasing on treatment -restaging CT CAP 02/25/22 showed good treatment response: improvement to liver lesions and bilateral pleural effusions, resolution of LLQ nodule, stable osseous metastatic lesions. No evidence of new metastatic disease. -She continues to have excellent response and do clinically well except for stable mild body aches and fatigue.  Her recent worsening diarrhea is likely related to her Crohn's disease, I do not think it is from her fulvestrant injection.  Labs reviewed, overall stable. We will proceed with fulvestrant injection and continue every 4 weeks.

## 2022-07-18 NOTE — Progress Notes (Signed)
Sara Barnes   [phone] Fax:([phone]   Clinic Follow up Note   Patient Care Team: Carollee Herter, Alferd Apa, DO as PCP - General [name], MD as Consulting Physician (Breast Surgery) Pyrtle, Lajuan Lines, MD as Consulting Physician (Gastroenterology) Druscilla Brownie, MD as Referring Physician (Dermatology) Cindra Presume, MD as Consulting Physician (Plastic Surgery) Truitt Merle, MD as Consulting Physician (Hematology)  Date of Service:  07/18/2022  CHIEF COMPLAINT: f/u of metastatic breast cancer     CURRENT THERAPY:  Fulvestrant injections, starting 05/15/21, now q4weeks     ASSESSMENT:  Sara Barnes is a 82 y.o. 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 Barry Dienes. Oncotype RS of 4 (low risk). She received adjuvant radiation. She attempted letrozole previously but did not tolerate due to colitis (pt has Crohn's). -she developed a rash in early 2022. Excisional biopsy by Dr. Claudia Desanctis on 07/25/20 confirmed invasive carcinoma, felt to represent recurrence of breast cancer. She declined further work up with [place] PET scan and was subsequently lost to f/u. -she developed liver and bone mets in 03/2021.  -Due to her colitis, she may not tolerate CDK4/6 inhibitor well (due to potential diarrhea). After a lengthy discussion, she agreed to fulvestrant injections. She received her first dose on 05/15/21. She has tolerated well. -baseline CA 27.29 06/14/21 was elevated at 375.9. This has been steadily decreasing on treatment -restaging CT CAP 02/25/22 showed good treatment response: improvement to liver lesions and bilateral pleural effusions, resolution of LLQ nodule, stable osseous metastatic lesions. No evidence of new metastatic disease. -She is clinically stable, does have  fatigue and manageable diarrhea.  She wants to know if she can reduce fulvestrant dose injection, to see if her fatigue improves.  We discussed that the low-dose fulvestrant may not be adequate for her disease control.  After discussion, she is willing to proceed with fourth dose today, and see what the restaging scan show in the next 3 to 4 weeks.    Anxiety and depression -she is on 20 mg generic Prozac. She does not feel this is helping for her new anxiety surrounding her cancer. -I prescribed Xanax on 05/30/21 for her to use as needed. She is no longer taking daily. -she previously declined referral to counseling. -she was increased to 40mg  prozac in 06/2021. She tells me she is taking every other day. She reports difficulty sleeping, which I explained could be related to her not taking daily. I will called in 20mg  for her and advised her to take daily.  Crohn's disease of colon with complication ([place]) -Follow-up with Dr. Hilarie Fredrickson -Due to her worsening diarrhea recently, she was seen by Dr. Hilarie Fredrickson and his PA on 06/12/22    PLAN: -lab reviewed -Order CT scan and bone scan to be done in 3 to 4 weeks -proceed with Fulvestrant injection today at the same dose - lab, f/u and fulvestrant injection in 4 weeks  SUMMARY OF ONCOLOGIC HISTORY: Oncology History Overview Note  Cancer of lower-inner quadrant of left female breast [facility])   Staging form: Breast, AJCC 7th Edition     Clinical stage from 12/06/2014: Stage IA (T1c, N0, M0) - Unsigned     Pathologic stage from 12/13/2014: Stage IA (T1c, N0, cM0) - Unsigned      Cancer of lower-inner quadrant  of left female breast ([place])  11/28/2014 Breast US   Highly suspicious 1.6 cm irregular shadowing mass at the site of palpable concern in the subareolar lower inner quadrant of the left breast at approximately 630.   11/29/2014 Receptors her2   Estrogen Receptor: 100%, POSITIVE, STRONG STAINING INTENSITY (PERFORMED MANUALLY) Progesterone Receptor: 95%,  POSITIVE, STRONG STAINING INTENSITY (PERFORMED MANUALLY) Proliferation Marker Ki67: 10% (PERFORMED MANUALLY), Her2neu negative   11/29/2014 Initial Biopsy   Breast, left, needle core biopsy, subareolar about 6:30 - INVASIVE DUCTAL CARCINOMA, SEE COMMENT.   11/30/2014 Initial Diagnosis   Cancer of lower-inner quadrant of left female breast   12/13/2014 Pathology Results   Left breast lumpectomy showed a 1.7 cm invasive ductal carcinoma, grade 1, margins were negative, (+) LVI, 5 Sentinel lymph nodes were negative,   12/13/2014 Oncotype testing   RS 4, which predicts 10-year risk of distant recurrence 4% with tamoxifen alone.   02/05/2015 - 02/26/2015 Radiation Therapy   left breast radiaiton    03/18/2015 -  Anti-estrogen oral therapy   She tried letrozole for 2-3 weeks, could not tolerate and stopped.   07/25/2020 Pathology Results   FINAL MICROSCOPIC DIAGNOSIS: A. SKIN, LEFT CHEST, EXCISION: - Invasive carcinoma involving dermis and subcutaneous soft tissue, consistent with patient's clinical history of invasive ductal carcinoma, see comment - Deep and lateral resection margins are negative for carcinoma  Appears grade I  ADDENDUM: PROGNOSTIC INDICATOR RESULTS: Immunohistochemical and morphometric analysis performed manually The tumor cells are NEGATIVE for Her2 (1+). Estrogen Receptor: POSITIVE, 90%, STRONG STAINING Progesterone Receptor: POSITIVE, 95%, STRONG STAINING Proliferation Marker Ki-67: 5%    04/19/2021 Imaging   EXAM: CT ABDOMEN AND PELVIS WITH CONTRAST  IMPRESSION: 1. Multiple hypodense hepatic masses, as detailed above, highly suspicious for metastatic disease. Differential include infectious/inflammatory process. Further evaluation with MRI examination with contrast would be helpful. 2. Moderate left and small right pleural effusion. 3. Postsurgical changes for prior hemicolectomy. No bowel obstruction, colitis or diverticulitis.   04/24/2021 Pathology  Results   FINAL MICROSCOPIC DIAGNOSIS:   A. LIVER, BIOPSY:  -  Metastatic carcinoma  -  See comment   COMMENT:  By immunohistochemistry, the neoplastic cells are positive for  cytokeratin 7, ER (100%, strong), GATA3 and PAX8 (patchy weak) but negative for cytokeratin 20 and TTF-1.  Overall, the morphology and immunophenotype supports metastasis from the patient's known mammary carcinoma.   ADDENDUM:  By immunohistochemistry, HER-2 is EQUIVOCAL (2+).    ADDENDUM:  FLOURESCENCE IN-SITU HYBRIDIZATION RESULTS:  GROUP 5:   HER2 **NEGATIVE**    05/11/2021 Imaging   EXAM: CT CHEST WITH CONTRAST  IMPRESSION: 1. Bilateral pleural effusions and lower lobe compressive atelectasis, left greater than right. 2. Nonspecific right hilar lymphadenopathy. 3. Bony lytic lesions involving the left posterior fifth rib, left T5 transverse process, and left aspect of the T10 vertebral body, consistent with bony metastases. 4. Patchy right upper lobe ground-glass airspace disease, which may be inflammatory or infectious. 5. Slight decrease in size of the left lobe subcapsular liver hematoma. No evidence of active hemorrhage. 6. Stable liver hypodensities consistent with presumed metastatic disease. Please correlate with previous biopsy results.   05/13/2021 Pathology Results   Specimen Submitted:  A. PLEURAL FLUID, LEFT, THORACENTESIS:   FINAL MICROSCOPIC DIAGNOSIS:  - Atypical mesothelial cells present  - Chronic inflammation       INTERVAL HISTORY:  Sara Barnes is here for a follow up of  metastatic breast cancer   She was last seen by me on  05/23/2022. She presents to the clinic accompanied by sister. Pt state the last injection was very painful  at her right hand side of the gluteus maximus and she had to use some the pain pill for pain. Pt state that she is very fatigue. Pt state she did see the PA over at Dr. Hilarie Fredrickson office. Pt has a lot of diarrhea. Pt state that she has been nauseous  frequently  lately.   All other systems were reviewed with the patient and are negative.  MEDICAL HISTORY:  Past Medical History:  Diagnosis Date   Allergy    Anemia    Anxiety    Arthritis    Basal cell carcinoma    Blood transfusion without reported diagnosis    Bowel perforation ([place])    Cancer of lower-inner quadrant of female breast ([place]) 11/30/2014   left   Cancer of lower-inner quadrant of left female breast ([place]) 11/30/2014   Clostridium difficile infection    Complication of anesthesia    anxiety post-op after ear surgeries   Crohn's disease of large intestine with abscess ([place])    Dental crowns present    recent root canal 11/2014   Depression    Family history of adverse reaction to anesthesia    pt's sister has hx. 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   Seasonal allergies    TMJ (temporomandibular joint syndrome)    Ulcerative colitis    Vitamin B12 deficiency     SURGICAL HISTORY: Past Surgical History:  Procedure Laterality Date   ABDOMINAL HYSTERECTOMY     partial   APPENDECTOMY     AUGMENTATION MAMMAPLASTY Bilateral    BASAL CELL CARCINOMA EXCISION Left    nose   BREAST BIOPSY Left 2019   benign   BREAST ENHANCEMENT SURGERY     BREAST LUMPECTOMY Left 2016   BREAST LUMPECTOMY WITH RADIOACTIVE SEED AND SENTINEL LYMPH NODE BIOPSY Left 12/13/2014   Procedure: LEFT BREAST LUMPECTOMY WITH RADIOACTIVE SEED AND SENTINEL LYMPH NODE BIOPSY;  Surgeon: [name], MD;  Location: [facility];  Service: General;  Laterality: Left;   COLONOSCOPY WITH PROPOFOL  05/28/2011; 06/28/2014   EXPLORATORY LAPAROTOMY  04/08/2018   with end ileostomy takedown  and creation of loop ileostomy-- [facility] Sara Barnes N/A 01/08/2016   Procedure: Beryle Quant;  Surgeon: [name], MD;  Location[facility];  Service: Gastroenterology;  Laterality: N/A;  needs MAC due to pain and anxiety   ILEOSTOMY N/A 05/25/2017   Procedure: DILATION OF ILEOSTOMY;  Surgeon: Rolm Bookbinder, MD;  Location: [facility];  Service: General;  Laterality: N/A;   ILEOSTOMY  02/11/2016   END ileostomy.  Dr Hassell Done   ILEOSTOMY CLOSURE N/A 06/02/2017   Procedure: RESECTION OF ILEAL STRICTURE; ILEOSTOMY REVISION ;  Surgeon: Michael Boston, MD;  Location: [facility];  Service: General;  Laterality: N/A;   ILEOSTOMY DILATION  05/25/2017   DILITATION OF ILEAL STRICTURE   ileostomy stricture resection  06/02/2017   with revision   INCISION AND DRAINAGE ABSCESS  2018   INCISION AND DRAINAGE PERIRECTAL ABSCESS N/A  09/28/2013   Procedure: IRRIGATION AND DEBRIDEMENT PERIANAL ABSCESS, proctoscopy;  Surgeon: Shann Medal, MD;  Location: [facility];  Service: General;  Laterality: N/A;   INCISION AND DRAINAGE PERIRECTAL ABSCESS N/A 01/22/2016   Procedure: IRRIGATION AND DEBRIDEMENT PERIRECTAL ABSCESS;  Surgeon: Armandina Gemma, MD;  [facility];  Service: General;  Laterality: N/A;   INNER EAR SURGERY Bilateral    LAPAROSCOPIC DIVERTED COLOSTOMY N/A 02/11/2016   Procedure: LAPAROSCOPIC DIVERTED ILEOSTOMY;  Surgeon: Johnathan Hausen, MD;  Location[facility];  Service: General;  Laterality: N/A;   LAPAROSCOPY N/A 03/23/2017   Procedure: LAPAROSCOPY DIAGNOSTIC, EXPLORATORY LAPAROTOMY,  BOWEL RESECTION;  Surgeon: Jackolyn Confer, MD;  Location[facility];  Service: General;  Laterality: N/A;    I have reviewed the social history and family history with the patient and they are unchanged from previous note.  ALLERGIES:  is allergic to nsaids, codeine, mesalamine, oxycodone, lactose intolerance (gi), norco [hydrocodone-acetaminophen],  other, and sulfa antibiotics.  MEDICATIONS:  Current Outpatient Medications  Medication Sig Dispense Refill   acetaminophen (TYLENOL) 500 MG tablet You can take 2 tablets every 6 hours as needed for pain.  Use this as your primary pain control.  Use Tramadol as a last resort.  You can buy this over the counter at any drug store.    DO NOT TAKE MORE THAN 4000 MG OF TYLENOL PER DAY.  IT CAN HARM YOUR LIVER. 30 tablet 0   ALPRAZolam (XANAX) 0.25 MG tablet TAKE 1 TO 2 TABLETS(0.25 TO 0.5 MG) BY MOUTH TWICE DAILY AS NEEDED FOR ANXIETY 60 tablet 0   cephALEXin (KEFLEX) 500 MG capsule Take 1 capsule (500 mg total) by mouth 2 (two) times daily. 14 capsule 0   cyanocobalamin (VITAMIN B12) 1000 MCG/ML injection INJECT 1ML INTO THE MUSCLE ONCE PER MONTH 3 mL 11   diphenoxylate-atropine (LOMOTIL) 2.5-0.025 MG tablet Take 1-2 tablets by mouth 3-4 times daily AS NEEDED for severe diarrhea 240 tablet 6   famotidine (PEPCID) 20 MG tablet Take 1 tablet (20 mg total) by mouth 2 (two) times daily. (Patient taking differently: Take 20 mg by mouth daily as needed for indigestion.) 180 tablet 6   FLUoxetine (PROZAC) 20 MG capsule Take 1 capsule (20 mg total) by mouth daily. 90 capsule 1   fluticasone (FLONASE) 50 MCG/ACT nasal spray Place 2 sprays into both nostrils daily. (Patient taking differently: Place 2 sprays into both nostrils daily as needed for rhinitis.) 16 g 6   loperamide (IMODIUM) 2 MG capsule Take 1 capsule (2 mg total) by mouth as needed for diarrhea or loose stools. 90 capsule 1   ondansetron (ZOFRAN-ODT) 4 MG disintegrating tablet DISSOLVE ONE TABLET BY MOUTH EVERY 6 HOURS AS NEEDED FOR NAUSEA AND VOMITING 30 tablet 3   predniSONE (DELTASONE) 5 MG tablet 1 po qd 90 tablet 3   zolpidem (AMBIEN) 10 MG tablet TAKE 1 TABLET(10 MG) BY MOUTH AT BEDTIME AS NEEDED FOR SLEEP 30 tablet 0   No current facility-administered medications for this visit.    PHYSICAL EXAMINATION: ECOG PERFORMANCE STATUS: 2 -  Symptomatic, <50% confined to bed  Vitals:   07/18/22 1317  BP: 117/81  Pulse: 83  Resp: 15  Temp: 97.8 F (36.6 C)  SpO2: 96%   Wt Readings from Last 3 Encounters:  07/18/22 154 lb 4.8 oz (70 kg)  06/12/22 154 lb (69.9 kg)  05/23/22 158 lb 9.6 oz (71.9 kg)     GENERAL:alert, no distress and comfortable SKIN: skin color normal, no rashes  or significant lesions EYES: normal, Conjunctiva are pink and non-injected, sclera clear  NEURO: alert & oriented x 3 with fluent speech   LABORATORY DATA:  I have reviewed the data as listed    Latest Ref Rng & Units 07/18/2022   12:57 PM 05/23/2022   12:40 PM 02/27/2022   12:56 PM  CBC  WBC 4.0 - 10.5 K/uL 10.7  7.2  7.6   Hemoglobin 12.0 - 15.0 g/dL 14.5  14.0  14.5   Hematocrit 36.0 - 46.0 % 41.1  40.3  42.5   Platelets 150 - 400 K/uL 242  267  251         Latest Ref Rng & Units 07/18/2022   12:57 PM 05/23/2022   12:40 PM 02/27/2022   12:56 PM  CMP  Glucose 70 - 99 mg/dL 112  95  110   BUN 8 - 23 mg/dL 15  16  16    Creatinine 0.44 - 1.00 mg/dL 1.06  1.01  0.97   Sodium 135 - 145 mmol/L 131  133  134   Potassium 3.5 - 5.1 mmol/L 4.3  3.7  4.1   Chloride 98 - 111 mmol/L 99  102  104   CO2 22 - 32 mmol/L 25  25  23    Calcium 8.9 - 10.3 mg/dL 9.1  8.7  9.1   Total Protein 6.5 - 8.1 g/dL 6.6  6.4  6.9   Total Bilirubin 0.3 - 1.2 mg/dL 0.7  0.4  0.6   Alkaline Phos 38 - 126 U/L 64  75  75   AST 15 - 41 U/L 13  11  14    ALT 0 - 44 U/L 10  8  8        RADIOGRAPHIC STUDIES: I have personally reviewed the radiological images as listed and agreed with the findings in the report. No results found.    Orders Placed This Encounter  Procedures   CT CHEST ABDOMEN PELVIS W CONTRAST    Standing Status:   Future    Standing Expiration Date:   07/18/2023    Order Specific Question:   Preferred imaging location?    Answer:   [facility]    Order Specific Question:   Release to patient    Answer:   Immediate    Order Specific  Question:   Is Oral Contrast requested for this exam?    Answer:   Yes, Per Radiology protocol   NM Bone Scan Whole Body    Standing Status:   Future    Standing Expiration Date:   07/18/2023    Order Specific Question:   If indicated for the ordered procedure, I authorize the administration of a radiopharmaceutical per Radiology protocol    Answer:   Yes    Order Specific Question:   Preferred imaging location?    Answer:   [facility]   All questions were answered. The patient knows to call the clinic with any problems, questions or concerns. No barriers to learning was detected. The total time spent in the appointment was 30 minutes.     Truitt Merle, MD 07/18/2022   Felicity Coyer, CMA, am acting as scribe for Truitt Merle, MD.   I have reviewed the above documentation for accuracy and completeness, and I agree with the above.

## 2022-07-18 NOTE — Assessment & Plan Note (Signed)
-  Follow-up with Dr. Hilarie Fredrickson -Due to her worsening diarrhea recently, she was seen by Dr. Hilarie Fredrickson and his PA on 06/12/22

## 2022-07-18 NOTE — Assessment & Plan Note (Signed)
-  she is on 20 mg generic Prozac. She does not feel this is helping for her new anxiety surrounding her cancer. -I prescribed Xanax on 05/30/21 for her to use as needed. She is no longer taking daily. -she previously declined referral to counseling. -she was increased to 40mg prozac in 06/2021. She tells me she is taking every other day. She reports difficulty sleeping, which I explained could be related to her not taking daily. I will called in 20mg for her and advised her to take daily. 

## 2022-07-19 LAB — CANCER ANTIGEN 27.29: CA 27.29: 22.7 U/mL (ref 0.0–38.6)

## 2022-07-21 ENCOUNTER — Telehealth: Payer: Self-pay | Admitting: Hematology

## 2022-07-21 NOTE — Telephone Encounter (Signed)
Contacted patient to scheduled appointments. Patient is aware of appointments that are scheduled.   

## 2022-07-23 ENCOUNTER — Other Ambulatory Visit: Payer: Self-pay

## 2022-07-24 ENCOUNTER — Telehealth: Payer: Self-pay

## 2022-07-24 NOTE — Telephone Encounter (Signed)
Gaetano Hawthorne from centralized scheduling called stating she spoke with the patients sister to set up her CT scan for April 17 and she stated the patient said that the contrast makes her sick and she wanted to know if she had to drink it. I spoke with Cira Rue NP and she stated that she could do the contrast by IV. I messaged Peggy back and let her know that she can do the contrast IV as per Cira Rue NP.

## 2022-08-07 ENCOUNTER — Other Ambulatory Visit: Payer: Self-pay | Admitting: Hematology

## 2022-08-07 DIAGNOSIS — G4700 Insomnia, unspecified: Secondary | ICD-10-CM

## 2022-08-07 DIAGNOSIS — C50312 Malignant neoplasm of lower-inner quadrant of left female breast: Secondary | ICD-10-CM

## 2022-08-11 ENCOUNTER — Encounter (HOSPITAL_COMMUNITY)
Admission: RE | Admit: 2022-08-11 | Discharge: 2022-08-11 | Disposition: A | Discharge status: Home / Self Care | Payer: Medicare HMO | Source: Ambulatory Visit | Attending: Hematology | Admitting: Hematology

## 2022-08-11 ENCOUNTER — Other Ambulatory Visit: Payer: Self-pay

## 2022-08-11 ENCOUNTER — Inpatient Hospital Stay: Payer: Medicare HMO | Attending: Hematology

## 2022-08-11 ENCOUNTER — Encounter: Payer: Self-pay | Admitting: *Deleted

## 2022-08-11 DIAGNOSIS — C50312 Malignant neoplasm of lower-inner quadrant of left female breast: Secondary | ICD-10-CM

## 2022-08-11 DIAGNOSIS — R160 Hepatomegaly, not elsewhere classified: Secondary | ICD-10-CM

## 2022-08-11 DIAGNOSIS — C50919 Malignant neoplasm of unspecified site of unspecified female breast: Secondary | ICD-10-CM | POA: Diagnosis not present

## 2022-08-11 DIAGNOSIS — C7951 Secondary malignant neoplasm of bone: Secondary | ICD-10-CM | POA: Insufficient documentation

## 2022-08-11 DIAGNOSIS — Z5111 Encounter for antineoplastic chemotherapy: Secondary | ICD-10-CM | POA: Insufficient documentation

## 2022-08-11 DIAGNOSIS — Z170 Estrogen receptor positive status [ER+]: Secondary | ICD-10-CM | POA: Diagnosis not present

## 2022-08-11 DIAGNOSIS — C787 Secondary malignant neoplasm of liver and intrahepatic bile duct: Secondary | ICD-10-CM | POA: Insufficient documentation

## 2022-08-11 LAB — CBC WITH DIFFERENTIAL (CANCER CENTER ONLY)
Abs Immature Granulocytes: 0.04 10*3/uL (ref 0.00–0.07)
Basophils Absolute: 0 10*3/uL (ref 0.0–0.1)
Basophils Relative: 0 %
Eosinophils Absolute: 0 10*3/uL (ref 0.0–0.5)
Eosinophils Relative: 0 %
HCT: 42.8 % (ref 36.0–46.0)
Hemoglobin: 14.4 g/dL (ref 12.0–15.0)
Immature Granulocytes: 0 %
Lymphocytes Relative: 10 %
Lymphs Abs: 1.1 10*3/uL (ref 0.7–4.0)
MCH: 32 pg (ref 26.0–34.0)
MCHC: 33.6 g/dL (ref 30.0–36.0)
MCV: 95.1 fL (ref 80.0–100.0)
Monocytes Absolute: 0.6 10*3/uL (ref 0.1–1.0)
Monocytes Relative: 6 %
Neutro Abs: 8.7 10*3/uL — ABNORMAL HIGH (ref 1.7–7.7)
Neutrophils Relative %: 84 %
Platelet Count: 256 10*3/uL (ref 150–400)
RBC: 4.5 MIL/uL (ref 3.87–5.11)
RDW: 12.4 % (ref 11.5–15.5)
WBC Count: 10.5 10*3/uL (ref 4.0–10.5)
nRBC: 0 % (ref 0.0–0.2)

## 2022-08-11 LAB — CMP (CANCER CENTER ONLY)
ALT: 9 U/L (ref 0–44)
AST: 13 U/L — ABNORMAL LOW (ref 15–41)
Albumin: 3.9 g/dL (ref 3.5–5.0)
Alkaline Phosphatase: 67 U/L (ref 38–126)
Anion gap: 7 (ref 5–15)
BUN: 14 mg/dL (ref 8–23)
CO2: 23 mmol/L (ref 22–32)
Calcium: 9.4 mg/dL (ref 8.9–10.3)
Chloride: 105 mmol/L (ref 98–111)
Creatinine: 1.03 mg/dL — ABNORMAL HIGH (ref 0.44–1.00)
GFR, Estimated: 55 mL/min — ABNORMAL LOW (ref >60)
Glucose, Bld: 111 mg/dL — ABNORMAL HIGH (ref 70–99)
Potassium: 4.2 mmol/L (ref 3.5–5.1)
Sodium: 135 mmol/L (ref 135–145)
Total Bilirubin: 0.6 mg/dL (ref 0.3–1.2)
Total Protein: 6.9 g/dL (ref 6.5–8.1)

## 2022-08-11 MED ORDER — TECHNETIUM TC 99M MEDRONATE IV KIT
20.0000 | PACK | Freq: Once | INTRAVENOUS | Status: AC | PRN
  Administered 2022-08-11: 21.7 via INTRAVENOUS

## 2022-08-12 LAB — CANCER ANTIGEN 27.29: CA 27.29: 26.4 U/mL (ref 0.0–38.6)

## 2022-08-13 ENCOUNTER — Ambulatory Visit (HOSPITAL_COMMUNITY)
Admission: RE | Admit: 2022-08-13 | Discharge: 2022-08-13 | Disposition: A | Discharge status: Home / Self Care | Payer: Medicare HMO | Source: Ambulatory Visit | Attending: Hematology | Admitting: Hematology

## 2022-08-13 DIAGNOSIS — C50919 Malignant neoplasm of unspecified site of unspecified female breast: Secondary | ICD-10-CM | POA: Diagnosis not present

## 2022-08-13 DIAGNOSIS — Z170 Estrogen receptor positive status [ER+]: Secondary | ICD-10-CM | POA: Insufficient documentation

## 2022-08-13 DIAGNOSIS — C50312 Malignant neoplasm of lower-inner quadrant of left female breast: Secondary | ICD-10-CM | POA: Insufficient documentation

## 2022-08-13 MED ORDER — SODIUM CHLORIDE (PF) 0.9 % IJ SOLN
INTRAMUSCULAR | Status: AC
  Filled 2022-08-13: qty 50

## 2022-08-13 MED ORDER — IOHEXOL 300 MG/ML  SOLN
100.0000 mL | Freq: Once | INTRAMUSCULAR | Status: AC | PRN
  Administered 2022-08-13: 100 mL via INTRAVENOUS

## 2022-08-14 ENCOUNTER — Other Ambulatory Visit: Payer: Self-pay

## 2022-08-14 ENCOUNTER — Inpatient Hospital Stay: Payer: Medicare HMO

## 2022-08-14 ENCOUNTER — Inpatient Hospital Stay: Payer: Medicare HMO | Admitting: Hematology

## 2022-08-14 ENCOUNTER — Encounter: Payer: Self-pay | Admitting: Hematology

## 2022-08-14 VITALS — BP 134/81 | HR 84 | Temp 97.9°F | Resp 18 | Ht 66.0 in | Wt 155.1 lb

## 2022-08-14 DIAGNOSIS — R160 Hepatomegaly, not elsewhere classified: Secondary | ICD-10-CM

## 2022-08-14 DIAGNOSIS — C50919 Malignant neoplasm of unspecified site of unspecified female breast: Secondary | ICD-10-CM

## 2022-08-14 DIAGNOSIS — Z170 Estrogen receptor positive status [ER+]: Secondary | ICD-10-CM | POA: Insufficient documentation

## 2022-08-14 DIAGNOSIS — F419 Anxiety disorder, unspecified: Secondary | ICD-10-CM

## 2022-08-14 DIAGNOSIS — F32A Depression, unspecified: Secondary | ICD-10-CM | POA: Diagnosis not present

## 2022-08-14 DIAGNOSIS — K50011 Crohn's disease of small intestine with rectal bleeding: Secondary | ICD-10-CM

## 2022-08-14 DIAGNOSIS — C50312 Malignant neoplasm of lower-inner quadrant of left female breast: Secondary | ICD-10-CM

## 2022-08-14 DIAGNOSIS — Z5111 Encounter for antineoplastic chemotherapy: Secondary | ICD-10-CM | POA: Diagnosis not present

## 2022-08-14 DIAGNOSIS — C787 Secondary malignant neoplasm of liver and intrahepatic bile duct: Secondary | ICD-10-CM | POA: Diagnosis not present

## 2022-08-14 DIAGNOSIS — C7951 Secondary malignant neoplasm of bone: Secondary | ICD-10-CM | POA: Insufficient documentation

## 2022-08-14 MED ORDER — FULVESTRANT 250 MG/5ML IM SOSY
500.0000 mg | PREFILLED_SYRINGE | Freq: Once | INTRAMUSCULAR | Status: AC
  Administered 2022-08-14: 500 mg via INTRAMUSCULAR
  Filled 2022-08-14: qty 10

## 2022-08-14 NOTE — Assessment & Plan Note (Addendum)
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 Donell Beers. Oncotype RS of 4 (low risk). She received adjuvant radiation. She attempted letrozole previously but did not tolerate due to colitis (pt has Crohn's). -she developed a rash in early 2022. Excisional biopsy by Dr. Arita Miss on 07/25/20 confirmed invasive carcinoma, felt to represent recurrence of breast cancer. She declined further work up with [place] PET scan and was subsequently lost to f/u. -she developed liver and bone mets in 03/2021.  -Due to her colitis, she may not tolerate CDK4/6 inhibitor well (due to potential diarrhea). After a lengthy discussion, she agreed to fulvestrant injections. She received her first dose on 05/15/21. She has tolerated well. -baseline CA 27.29 06/14/21 was elevated at 375.9. This has been steadily decreasing on treatment -restaging CT CAP 02/25/22 showed good treatment response: improvement to liver lesions and bilateral pleural effusions, resolution of LLQ nodule, stable osseous metastatic lesions. No evidence of new metastatic disease. -She is clinically stable, does have fatigue and manageable diarrhea -Restaging bone scan from August 11, 2022 showed mild uptake in her ribs, indeterminate.  CT scan from yesterday showed

## 2022-08-14 NOTE — Assessment & Plan Note (Signed)
-  she is on 20 mg generic Prozac. She does not feel this is helping for her new anxiety surrounding her cancer. -I prescribed Xanax on 05/30/21 for her to use as needed. She is no longer taking daily. -she previously declined referral to counseling. -Continue Prozac

## 2022-08-14 NOTE — Progress Notes (Signed)
[facility]   [phone] [phone]   Clinic Follow up Note   Patient Care Team: [name], [name], DO as PCP - General [name], MD as Consulting Physician (Breast Surgery) Pyrtle, Carie Caddy, MD as Consulting Physician (Gastroenterology) [name], MD as Referring Physician (Dermatology) Allena Napoleon, MD as Consulting Physician (Plastic Surgery) Malachy Mood, MD as Consulting Physician (Hematology)  Date of Service:  08/14/2022  CHIEF COMPLAINT: f/u of metastatic breast cancer    CURRENT THERAPY:  Fulvestrant injections, starting 05/15/21, now q4weeks     ASSESSMENT:  Sara Barnes is a 82 y.o. 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 Donell Beers. Oncotype RS of 4 (low risk). She received adjuvant radiation. She attempted letrozole previously but did not tolerate due to colitis (pt has Crohn's). -she developed a rash in early 2022. Excisional biopsy by Dr. Arita Miss on 07/25/20 confirmed invasive carcinoma, felt to represent recurrence of breast cancer. She declined further work up with [place] PET scan and was subsequently lost to f/u. -she developed liver and bone mets in 03/2021.  -Due to her colitis, she may not tolerate CDK4/6 inhibitor well (due to potential diarrhea). After a lengthy discussion, she agreed to fulvestrant injections. She received her first dose on 05/15/21. She has tolerated well. -baseline CA 27.29 06/14/21 was elevated at 375.9. This has been steadily decreasing on treatment -restaging CT CAP 02/25/22 showed good treatment response: improvement to liver lesions and bilateral pleural effusions, resolution of LLQ nodule, stable osseous metastatic lesions. No evidence of new metastatic disease. -She is clinically stable, does have  fatigue and manageable diarrhea -Restaging bone scan from August 11, 2022 showed mild uptake in her ribs, indeterminate.  CT scan from yesterday showed stable/slight decreased liver lesions, mixed lytic and sclerotic bone lesion in L4 vertebral body, overall stable.  No other new lesions.  Patient was concerned about the hematoma from previous liver biopsy in 2022, I reviewed the multiple previous CT scans, her hematoma has resolved. -Will continue fulvestrant injection every 4 weeks, she is tolerating well.   Anxiety and depression -she is on 20 mg generic Prozac. She does not feel this is helping for her new anxiety surrounding her cancer. -I prescribed Xanax on 05/30/21 for her to use as needed. She is no longer taking daily. -she previously declined referral to counseling. -Continue Prozac  Crohn disease (HCC) -Follow-up with Dr. Rhea Belton -Due to her worsening diarrhea recently, she was seen by Dr. Rhea Belton and his PA on 06/12/22      PLAN: -Lab, CT scan and bone scan images reviewed with patient overall stable disease. -Will proceed with fulvestrant injection today and continue every 4 weeks -Follow-up in 8 weeks.     SUMMARY OF ONCOLOGIC HISTORY: Oncology History Overview Note  Cancer of lower-inner quadrant of left female breast [facility])   Staging form: Breast, AJCC 7th Edition     Clinical stage from 12/06/2014: Stage IA (T1c, N0, M0) - Unsigned     Pathologic stage from 12/13/2014: Stage IA (T1c, N0, cM0) - Unsigned      Cancer of lower-inner quadrant of left female breast  11/28/2014 Breast US   Highly suspicious 1.6 cm irregular shadowing mass at the site of palpable concern in the subareolar lower inner quadrant of the left breast  at approximately 630.   11/29/2014 Receptors her2   Estrogen Receptor: 100%, POSITIVE, STRONG STAINING INTENSITY (PERFORMED MANUALLY) Progesterone Receptor: 95%, POSITIVE, STRONG STAINING INTENSITY (PERFORMED MANUALLY) Proliferation Marker Ki67: 10%  (PERFORMED MANUALLY), Her2neu negative   11/29/2014 Initial Biopsy   Breast, left, needle core biopsy, subareolar about 6:30 - INVASIVE DUCTAL CARCINOMA, SEE COMMENT.   11/30/2014 Initial Diagnosis   Cancer of lower-inner quadrant of left female breast   12/13/2014 Pathology Results   Left breast lumpectomy showed a 1.7 cm invasive ductal carcinoma, grade 1, margins were negative, (+) LVI, 5 Sentinel lymph nodes were negative,   12/13/2014 Oncotype testing   RS 4, which predicts 10-year risk of distant recurrence 4% with tamoxifen alone.   02/05/2015 - 02/26/2015 Radiation Therapy   left breast radiaiton    03/18/2015 -  Anti-estrogen oral therapy   She tried letrozole for 2-3 weeks, could not tolerate and stopped.   07/25/2020 Pathology Results   FINAL MICROSCOPIC DIAGNOSIS: A. SKIN, LEFT CHEST, EXCISION: - Invasive carcinoma involving dermis and subcutaneous soft tissue, consistent with patient's clinical history of invasive ductal carcinoma, see comment - Deep and lateral resection margins are negative for carcinoma  Appears grade I  ADDENDUM: PROGNOSTIC INDICATOR RESULTS: Immunohistochemical and morphometric analysis performed manually The tumor cells are NEGATIVE for Her2 (1+). Estrogen Receptor: POSITIVE, 90%, STRONG STAINING Progesterone Receptor: POSITIVE, 95%, STRONG STAINING Proliferation Marker Ki-67: 5%    04/19/2021 Imaging   EXAM: CT ABDOMEN AND PELVIS WITH CONTRAST  IMPRESSION: 1. Multiple hypodense hepatic masses, as detailed above, highly suspicious for metastatic disease. Differential include infectious/inflammatory process. Further evaluation with MRI examination with contrast would be helpful. 2. Moderate left and small right pleural effusion. 3. Postsurgical changes for prior hemicolectomy. No bowel obstruction, colitis or diverticulitis.   04/24/2021 Pathology Results   FINAL MICROSCOPIC DIAGNOSIS:   A. LIVER, BIOPSY:  -  Metastatic carcinoma  -   See comment   COMMENT:  By immunohistochemistry, the neoplastic cells are positive for  cytokeratin 7, ER (100%, strong), GATA3 and PAX8 (patchy weak) but negative for cytokeratin 20 and TTF-1.  Overall, the morphology and immunophenotype supports metastasis from the patient's known mammary carcinoma.   ADDENDUM:  By immunohistochemistry, HER-2 is EQUIVOCAL (2+).    ADDENDUM:  FLOURESCENCE IN-SITU HYBRIDIZATION RESULTS:  GROUP 5:   HER2 **NEGATIVE**    05/11/2021 Imaging   EXAM: CT CHEST WITH CONTRAST  IMPRESSION: 1. Bilateral pleural effusions and lower lobe compressive atelectasis, left greater than right. 2. Nonspecific right hilar lymphadenopathy. 3. Bony lytic lesions involving the left posterior fifth rib, left T5 transverse process, and left aspect of the T10 vertebral body, consistent with bony metastases. 4. Patchy right upper lobe ground-glass airspace disease, which may be inflammatory or infectious. 5. Slight decrease in size of the left lobe subcapsular liver hematoma. No evidence of active hemorrhage. 6. Stable liver hypodensities consistent with presumed metastatic disease. Please correlate with previous biopsy results.   05/13/2021 Pathology Results   Specimen Submitted:  A. PLEURAL FLUID, LEFT, THORACENTESIS:   FINAL MICROSCOPIC DIAGNOSIS:  - Atypical mesothelial cells present  - Chronic inflammation       INTERVAL HISTORY:  ARTHA STAVROS is here for a follow up of metastatic breast cancer    She was last seen by me on 07/18/2022. She presents to the clinic with her sister. He is clinically stable, still has mild chronic diarrhea, energy level is stable, overall low, but she is able to function at home.  No  new pain or other symptoms.   All other systems were reviewed with the patient and are negative.  MEDICAL HISTORY:  Past Medical History:  Diagnosis Date   Allergy    Anemia    Anxiety    Arthritis    Basal cell carcinoma    Blood transfusion  without reported diagnosis    Bowel perforation    Cancer of lower-inner quadrant of female breast 11/30/2014   left   Cancer of lower-inner quadrant of left female breast 11/30/2014   Clostridium difficile infection    Complication of anesthesia    anxiety post-op after ear surgeries   Crohn's disease of large intestine with abscess    Dental crowns present    recent root canal 11/2014   Depression    Family history of adverse reaction to anesthesia    pt's sister has hx. of post-op N/V   GERD (gastroesophageal reflux disease)    Hyperlipidemia    Indigestion    Liver carcinoma 2023   mets from breasts   Lung cancer    primary-breast   Perianal abscess    Personal history of radiation therapy 2016   PONV (postoperative nausea and vomiting)    Runny nose 12/07/2014   clear drainage, per pt.   Seasonal allergies    TMJ (temporomandibular joint syndrome)    Ulcerative colitis    Vitamin B12 deficiency     SURGICAL HISTORY: Past Surgical History:  Procedure Laterality Date   ABDOMINAL HYSTERECTOMY     partial   APPENDECTOMY     AUGMENTATION MAMMAPLASTY Bilateral    BASAL CELL CARCINOMA EXCISION Left    nose   BREAST BIOPSY Left 2019   benign   BREAST ENHANCEMENT SURGERY     BREAST LUMPECTOMY Left 2016   BREAST LUMPECTOMY WITH RADIOACTIVE SEED AND SENTINEL LYMPH NODE BIOPSY Left 12/13/2014   Procedure: LEFT BREAST LUMPECTOMY WITH RADIOACTIVE SEED AND SENTINEL LYMPH NODE BIOPSY;  Surgeon: [name], MD;  Location: [facility];  Service: General;  Laterality: Left;   COLONOSCOPY WITH PROPOFOL  05/28/2011; 06/28/2014   EXPLORATORY LAPAROTOMY  04/08/2018   with end ileostomy takedown and creation of loop ileostomy-- [facility]-Dr Inetta Fermo   FLEXIBLE SIGMOIDOSCOPY N/A 01/08/2016   Procedure: Arnell Sieving;  Surgeon: Ruffin Frederick, MD;  Location: [facility];  Service: Gastroenterology;  Laterality: N/A;  needs MAC due  to pain and anxiety   ILEOSTOMY N/A 05/25/2017   Procedure: DILATION OF ILEOSTOMY;  Surgeon: Emelia Loron, MD;  Location: [facility];  Service: General;  Laterality: N/A;   ILEOSTOMY  02/11/2016   END ileostomy.  Dr Daphine Deutscher   ILEOSTOMY CLOSURE N/A 06/02/2017   Procedure: RESECTION OF ILEAL STRICTURE; ILEOSTOMY REVISION ;  Surgeon: Karie Soda, MD;  Location: [facility];  Service: General;  Laterality: N/A;   ILEOSTOMY DILATION  05/25/2017   DILITATION OF ILEAL STRICTURE   ileostomy stricture resection  06/02/2017   with revision   INCISION AND DRAINAGE ABSCESS  2018   INCISION AND DRAINAGE PERIRECTAL ABSCESS N/A 09/28/2013   Procedure: IRRIGATION AND DEBRIDEMENT PERIANAL ABSCESS, proctoscopy;  Surgeon: Kandis Cocking, MD;  Location: [facility];  Service: General;  Laterality: N/A;   INCISION AND DRAINAGE PERIRECTAL ABSCESS N/A 01/22/2016   Procedure: IRRIGATION AND DEBRIDEMENT PERIRECTAL ABSCESS;  Surgeon: Darnell Level, MD;  Location: [facility];  Service: General;  Laterality: N/A;   INNER EAR SURGERY Bilateral    LAPAROSCOPIC DIVERTED COLOSTOMY N/A 02/11/2016  Procedure: LAPAROSCOPIC DIVERTED ILEOSTOMY;  Surgeon: Luretha Murphy, MD;  Location: [facility];  Service: General;  Laterality: N/A;   LAPAROSCOPY N/A 03/23/2017   Procedure: LAPAROSCOPY DIAGNOSTIC, EXPLORATORY LAPAROTOMY,  BOWEL RESECTION;  Surgeon: Avel Peace, MD;  Location: [facility];  Service: General;  Laterality: N/A;    I have reviewed the social history and family history with the patient and they are unchanged from previous note.  ALLERGIES:  is allergic to nsaids, codeine, mesalamine, oxycodone, lactose intolerance (gi), norco [hydrocodone-acetaminophen], other, and sulfa antibiotics.  MEDICATIONS:  Current Outpatient Medications  Medication Sig Dispense Refill   acetaminophen (TYLENOL) 500 MG tablet You can take 2 tablets every 6 hours as needed for pain.  Use this as your primary pain control.  Use Tramadol as a last resort.  You  can buy this over the counter at any drug store.    DO NOT TAKE MORE THAN 4000 MG OF TYLENOL PER DAY.  IT CAN HARM YOUR LIVER. 30 tablet 0   ALPRAZolam (XANAX) 0.25 MG tablet TAKE 1 TO 2 TABLETS(0.25 TO 0.5 MG) BY MOUTH TWICE DAILY AS NEEDED FOR ANXIETY 60 tablet 0   cephALEXin (KEFLEX) 500 MG capsule Take 1 capsule (500 mg total) by mouth 2 (two) times daily. 14 capsule 0   cyanocobalamin (VITAMIN B12) 1000 MCG/ML injection INJECT INTO THE MUSCLE ONCE PER MONTH 3 mL 11   diphenoxylate-atropine (LOMOTIL) 2.5-0.025 MG tablet Take 1-2 tablets by mouth 3-4 times daily AS NEEDED for severe diarrhea 240 tablet 6   famotidine (PEPCID) 20 MG tablet Take 1 tablet (20 mg total) by mouth 2 (two) times daily. (Patient taking differently: Take 20 mg by mouth daily as needed for indigestion.) 180 tablet 6   FLUoxetine (PROZAC) 20 MG capsule Take 1 capsule (20 mg total) by mouth daily. 90 capsule 1   fluticasone (FLONASE) 50 MCG/ACT nasal spray Place 2 sprays into both nostrils daily. (Patient taking differently: Place 2 sprays into both nostrils daily as needed for rhinitis.) 16 g 6   loperamide (IMODIUM) 2 MG capsule Take 1 capsule (2 mg total) by mouth as needed for diarrhea or loose stools. 90 capsule 1   ondansetron (ZOFRAN-ODT) 4 MG disintegrating tablet DISSOLVE ONE TABLET BY MOUTH EVERY 6 HOURS AS NEEDED FOR NAUSEA AND VOMITING 30 tablet 3   predniSONE (DELTASONE) 5 MG tablet 1 po qd 90 tablet 3   zolpidem (AMBIEN) 10 MG tablet TAKE 1 TABLET(10 MG) BY MOUTH AT BEDTIME AS NEEDED FOR SLEEP 30 tablet 0   No current facility-administered medications for this visit.    PHYSICAL EXAMINATION: ECOG PERFORMANCE STATUS: 2 - Symptomatic, <50% confined to bed  Vitals:   08/14/22 1222  BP: 134/81  Pulse: 84  Resp: 18  Temp: 97.9 F (36.6 C)  SpO2: 99%   Wt Readings from Last 3 Encounters:  08/14/22 155 lb 1.6 oz (70.4 kg)  07/18/22 154 lb 4.8 oz (70 kg)  06/12/22 154 lb (69.9 kg)      GENERAL:alert, no distress and comfortable SKIN: skin color, texture, turgor are normal, no rashes or significant lesions EYES: normal, Conjunctiva are pink and non-injected, sclera clear NECK: supple, thyroid normal size, non-tender, without nodularity LYMPH:  no palpable lymphadenopathy in the cervical, axillary  LUNGS: clear to auscultation and percussion with normal breathing effort HEART: regular rate & rhythm and no murmurs and no lower extremity edema ABDOMEN:abdomen soft, non-tender and normal bowel sounds Musculoskeletal:no cyanosis of digits and no clubbing  NEURO: alert &  oriented x 3 with fluent speech, no focal motor/sensory deficits  LABORATORY DATA:  I have reviewed the data as listed    Latest Ref Rng & Units 08/11/2022   11:14 AM 07/18/2022   12:57 PM 05/23/2022   12:40 PM  CBC  WBC 4.0 - 10.5 K/uL 10.5  10.7  7.2   Hemoglobin 12.0 - 15.0 g/dL 16.1  09.6  04.5   Hematocrit 36.0 - 46.0 % 42.8  41.1  40.3   Platelets 150 - 400 K/uL 256  242  267         Latest Ref Rng & Units 08/11/2022   11:14 AM 07/18/2022   12:57 PM 05/23/2022   12:40 PM  CMP  Glucose 70 - 99 mg/dL 409  811  95   BUN 8 - 23 mg/dL 14  15  16    Creatinine 0.44 - 1.00 mg/dL 9.14  7.82  9.56   Sodium 135 - 145 mmol/L 135  131  133   Potassium 3.5 - 5.1 mmol/L 4.2  4.3  3.7   Chloride 98 - 111 mmol/L 105  99  102   CO2 22 - 32 mmol/L 23  25  25    Calcium 8.9 - 10.3 mg/dL 9.4  9.1  8.7   Total Protein 6.5 - 8.1 g/dL 6.9  6.6  6.4   Total Bilirubin 0.3 - 1.2 mg/dL 0.6  0.7  0.4   Alkaline Phos 38 - 126 U/L 67  64  75   AST 15 - 41 U/L 13  13  11    ALT 0 - 44 U/L 9  10  8        RADIOGRAPHIC STUDIES: I have personally reviewed the radiological images as listed and agreed with the findings in the report. CT CHEST ABDOMEN PELVIS W CONTRAST  Result Date: 08/14/2022 CLINICAL DATA:  Follow-up metastatic breast carcinoma. Assess treatment response. * Tracking Code: BO * EXAM: CT CHEST, ABDOMEN,  AND PELVIS WITH CONTRAST TECHNIQUE: Multidetector CT imaging of the chest, abdomen and pelvis was performed following the standard protocol during bolus administration of intravenous contrast. RADIATION DOSE REDUCTION: This exam was performed according to the departmental dose-optimization program which includes automated exposure control, adjustment of the mA and/or kV according to patient size and/or use of iterative reconstruction technique. CONTRAST:  OMNIPAQUE IOHEXOL 300 MG/ML  SOLN COMPARISON:  02/25/2022 FINDINGS: CT CHEST FINDINGS Cardiovascular: No acute findings. Mediastinum/Lymph Nodes: No masses or pathologically enlarged lymph nodes identified. No evidence of axillary lymphadenopathy. Lungs/Pleura:  Stable small left pleural effusion versus thickening. Musculoskeletal: Mixed lytic and sclerotic bone lesions involving the T8 and T10 vertebral bodies remains stable. Several old left rib fracture deformities again noted. CT ABDOMEN AND PELVIS FINDINGS Hepatobiliary: Several small hypoenhancing lesions are again seen, largest in segment 2/3 measuring 2.2 x 1.6 cm. These show no significant change compared to previous study. No new or enlarging liver masses identified. Gallbladder is unremarkable. No evidence of biliary ductal dilatation. Pancreas:  No mass or inflammatory changes. Spleen:  Within normal limits in size and appearance. Adrenals/Urinary tract: No suspicious masses or hydronephrosis. Stomach/Bowel: Stable small hiatal hernia. No evidence of obstruction, inflammatory process, or abnormal fluid collections. Vascular/Lymphatic: No pathologically enlarged lymph nodes identified. No acute vascular findings. Aortic atherosclerotic calcification incidentally noted. Reproductive: Prior hysterectomy noted. Adnexal regions are unremarkable in appearance. Other:  None. Musculoskeletal: Mixed lytic and sclerotic bone lesion involving the L4 vertebral body remains stable. IMPRESSION: No new or  progressive metastatic disease  within the chest, abdomen, or pelvis. Stable small hypoenhancing liver lesions consistent with treated metastatic disease. Stable bone metastases involving the T8, T10, and L4 vertebral bodies. Stable small left pleural effusion versus thickening. Aortic Atherosclerosis (ICD10-I70.0). Electronically Signed   By: Danae Orleans M.D.   On: 08/14/2022 08:27      No orders of the defined types were placed in this encounter.  All questions were answered. The patient knows to call the clinic with any problems, questions or concerns. No barriers to learning was detected. The total time spent in the appointment was 30 minutes.     Malachy Mood, MD 08/14/2022   Carolin Coy, CMA, am acting as scribe for Malachy Mood, MD.   I have reviewed the above documentation for accuracy and completeness, and I agree with the above.

## 2022-08-14 NOTE — Assessment & Plan Note (Signed)
-  Follow-up with Dr. Pyrtle -Due to her worsening diarrhea recently, she was seen by Dr. Pyrtle and his PA on 06/12/22 

## 2022-08-18 ENCOUNTER — Other Ambulatory Visit: Payer: Self-pay

## 2022-09-02 ENCOUNTER — Other Ambulatory Visit: Payer: Self-pay

## 2022-09-02 ENCOUNTER — Other Ambulatory Visit: Payer: Self-pay | Admitting: Hematology

## 2022-09-03 ENCOUNTER — Other Ambulatory Visit: Payer: Self-pay | Admitting: Hematology

## 2022-09-05 ENCOUNTER — Telehealth: Payer: Self-pay | Admitting: *Deleted

## 2022-09-05 ENCOUNTER — Other Ambulatory Visit: Payer: Self-pay | Admitting: Hematology

## 2022-09-05 DIAGNOSIS — G4700 Insomnia, unspecified: Secondary | ICD-10-CM

## 2022-09-05 MED ORDER — ZOLPIDEM TARTRATE 10 MG PO TABS
ORAL_TABLET | ORAL | 0 refills | Status: DC
Start: 2022-09-05 — End: 2022-10-06

## 2022-09-05 NOTE — Telephone Encounter (Signed)
Patient called with concerns her hips being over used for injections. She wants to only have 1 injection this month due to her Right hip being so sore. Per Dr. Mosetta Putt dose decreased to 1 injection into her LEFT GLUTE.  Patient would like further injections into glutes instead of her hip muscles.

## 2022-09-11 ENCOUNTER — Inpatient Hospital Stay: Payer: Medicare HMO | Attending: Hematology

## 2022-09-11 ENCOUNTER — Other Ambulatory Visit: Payer: Self-pay

## 2022-09-11 VITALS — BP 141/77 | HR 73 | Temp 98.4°F | Resp 18

## 2022-09-11 DIAGNOSIS — Z79818 Long term (current) use of other agents affecting estrogen receptors and estrogen levels: Secondary | ICD-10-CM | POA: Diagnosis not present

## 2022-09-11 DIAGNOSIS — Z5111 Encounter for antineoplastic chemotherapy: Secondary | ICD-10-CM | POA: Insufficient documentation

## 2022-09-11 DIAGNOSIS — C792 Secondary malignant neoplasm of skin: Secondary | ICD-10-CM | POA: Diagnosis not present

## 2022-09-11 DIAGNOSIS — C50312 Malignant neoplasm of lower-inner quadrant of left female breast: Secondary | ICD-10-CM | POA: Diagnosis not present

## 2022-09-11 DIAGNOSIS — C7951 Secondary malignant neoplasm of bone: Secondary | ICD-10-CM | POA: Diagnosis not present

## 2022-09-11 DIAGNOSIS — C787 Secondary malignant neoplasm of liver and intrahepatic bile duct: Secondary | ICD-10-CM | POA: Insufficient documentation

## 2022-09-11 DIAGNOSIS — Z170 Estrogen receptor positive status [ER+]: Secondary | ICD-10-CM | POA: Insufficient documentation

## 2022-09-11 DIAGNOSIS — C50919 Malignant neoplasm of unspecified site of unspecified female breast: Secondary | ICD-10-CM

## 2022-09-11 DIAGNOSIS — R160 Hepatomegaly, not elsewhere classified: Secondary | ICD-10-CM

## 2022-09-11 MED ORDER — FULVESTRANT 250 MG/5ML IM SOSY
250.0000 mg | PREFILLED_SYRINGE | INTRAMUSCULAR | Status: DC
  Administered 2022-09-11: 250 mg via INTRAMUSCULAR
  Filled 2022-09-11: qty 5

## 2022-10-06 ENCOUNTER — Other Ambulatory Visit: Payer: Self-pay | Admitting: Hematology

## 2022-10-06 DIAGNOSIS — C50312 Malignant neoplasm of lower-inner quadrant of left female breast: Secondary | ICD-10-CM

## 2022-10-06 DIAGNOSIS — G4700 Insomnia, unspecified: Secondary | ICD-10-CM

## 2022-10-09 ENCOUNTER — Inpatient Hospital Stay: Payer: Medicare HMO

## 2022-10-09 ENCOUNTER — Inpatient Hospital Stay: Payer: Medicare HMO | Admitting: Hematology

## 2022-10-10 ENCOUNTER — Inpatient Hospital Stay: Payer: Medicare HMO

## 2022-10-10 ENCOUNTER — Inpatient Hospital Stay: Payer: Medicare HMO | Admitting: Hematology

## 2022-10-10 ENCOUNTER — Encounter: Payer: Self-pay | Admitting: Hematology

## 2022-10-10 ENCOUNTER — Telehealth: Payer: Self-pay

## 2022-10-10 ENCOUNTER — Inpatient Hospital Stay: Payer: Medicare HMO | Attending: Hematology

## 2022-10-10 VITALS — BP 140/83 | HR 81 | Temp 98.6°F | Resp 18 | Wt 156.0 lb

## 2022-10-10 DIAGNOSIS — C7951 Secondary malignant neoplasm of bone: Secondary | ICD-10-CM | POA: Insufficient documentation

## 2022-10-10 DIAGNOSIS — C792 Secondary malignant neoplasm of skin: Secondary | ICD-10-CM | POA: Diagnosis not present

## 2022-10-10 DIAGNOSIS — C50312 Malignant neoplasm of lower-inner quadrant of left female breast: Secondary | ICD-10-CM | POA: Insufficient documentation

## 2022-10-10 DIAGNOSIS — Z170 Estrogen receptor positive status [ER+]: Secondary | ICD-10-CM

## 2022-10-10 DIAGNOSIS — E538 Deficiency of other specified B group vitamins: Secondary | ICD-10-CM

## 2022-10-10 DIAGNOSIS — C50919 Malignant neoplasm of unspecified site of unspecified female breast: Secondary | ICD-10-CM

## 2022-10-10 DIAGNOSIS — R160 Hepatomegaly, not elsewhere classified: Secondary | ICD-10-CM

## 2022-10-10 DIAGNOSIS — C787 Secondary malignant neoplasm of liver and intrahepatic bile duct: Secondary | ICD-10-CM | POA: Insufficient documentation

## 2022-10-10 DIAGNOSIS — Z5111 Encounter for antineoplastic chemotherapy: Secondary | ICD-10-CM | POA: Diagnosis not present

## 2022-10-10 LAB — CBC WITH DIFFERENTIAL (CANCER CENTER ONLY)
Abs Immature Granulocytes: 0.07 10*3/uL (ref 0.00–0.07)
Basophils Absolute: 0.1 10*3/uL (ref 0.0–0.1)
Basophils Relative: 0 %
Eosinophils Absolute: 0 10*3/uL (ref 0.0–0.5)
Eosinophils Relative: 0 %
HCT: 41.4 % (ref 36.0–46.0)
Hemoglobin: 14.2 g/dL (ref 12.0–15.0)
Immature Granulocytes: 1 %
Lymphocytes Relative: 10 %
Lymphs Abs: 1.4 10*3/uL (ref 0.7–4.0)
MCH: 32.6 pg (ref 26.0–34.0)
MCHC: 34.3 g/dL (ref 30.0–36.0)
MCV: 95.2 fL (ref 80.0–100.0)
Monocytes Absolute: 0.7 10*3/uL (ref 0.1–1.0)
Monocytes Relative: 6 %
Neutro Abs: 10.9 10*3/uL — ABNORMAL HIGH (ref 1.7–7.7)
Neutrophils Relative %: 83 %
Platelet Count: 276 10*3/uL (ref 150–400)
RBC: 4.35 MIL/uL (ref 3.87–5.11)
RDW: 11.9 % (ref 11.5–15.5)
WBC Count: 13.1 10*3/uL — ABNORMAL HIGH (ref 4.0–10.5)
nRBC: 0 % (ref 0.0–0.2)

## 2022-10-10 LAB — CMP (CANCER CENTER ONLY)
ALT: 10 U/L (ref 0–44)
AST: 13 U/L — ABNORMAL LOW (ref 15–41)
Albumin: 3.8 g/dL (ref 3.5–5.0)
Alkaline Phosphatase: 82 U/L (ref 38–126)
Anion gap: 8 (ref 5–15)
BUN: 13 mg/dL (ref 8–23)
CO2: 25 mmol/L (ref 22–32)
Calcium: 9.5 mg/dL (ref 8.9–10.3)
Chloride: 104 mmol/L (ref 98–111)
Creatinine: 0.9 mg/dL (ref 0.44–1.00)
GFR, Estimated: >60 mL/min (ref >60)
Glucose, Bld: 122 mg/dL — ABNORMAL HIGH (ref 70–99)
Potassium: 4.8 mmol/L (ref 3.5–5.1)
Sodium: 137 mmol/L (ref 135–145)
Total Bilirubin: 0.5 mg/dL (ref 0.3–1.2)
Total Protein: 6.7 g/dL (ref 6.5–8.1)

## 2022-10-10 LAB — VITAMIN B12: Vitamin B-12: 616 pg/mL (ref 180–914)

## 2022-10-10 MED ORDER — FULVESTRANT 250 MG/5ML IM SOSY
500.0000 mg | PREFILLED_SYRINGE | INTRAMUSCULAR | Status: DC
  Administered 2022-10-10: 500 mg via INTRAMUSCULAR
  Filled 2022-10-10: qty 10
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
## 2022-10-10 NOTE — Patient Instructions (Signed)

## 2022-10-10 NOTE — Progress Notes (Signed)
[facility]   [phone] Fax:([phone]) [phone]   Clinic Follow up Note   Patient Care Team: [name], [name], DO as PCP - General [name], MD as Consulting Physician (Breast Surgery) Pyrtle, Carie Caddy, MD as Consulting Physician (Gastroenterology) [name], MD as Referring Physician (Dermatology) Allena Napoleon, MD as Consulting Physician (Plastic Surgery) Malachy Mood, MD as Consulting Physician (Hematology)  Date of Service:  10/10/2022  CHIEF COMPLAINT: f/u of metastatic breast cancer     CURRENT THERAPY:  Fulvestrant injections, starting 05/15/21, now q4weeks      ASSESSMENT:  Sara Barnes is a 82 y.o. 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 Donell Beers. Oncotype RS of 4 (low risk). She received adjuvant radiation. She attempted letrozole previously but did not tolerate due to colitis (pt has Crohn's). -she developed a rash in early 2022. Excisional biopsy by Dr. Arita Miss on 07/25/20 confirmed invasive carcinoma, felt to represent recurrence of breast cancer. She declined further work up with [place] PET scan and was subsequently lost to f/u. -she developed liver and bone mets in 03/2021.  -Due to her colitis, she may not tolerate CDK4/6 inhibitor well (due to potential diarrhea). After a lengthy discussion, she agreed to fulvestrant injections. She received her first dose on 05/15/21. She has tolerated well. -baseline CA 27.29 06/14/21 was elevated at 375.9. This has been steadily decreasing on treatment -restaging CT CAP 02/25/22 showed good treatment response: improvement to liver lesions and bilateral pleural effusions, resolution of LLQ nodule, stable osseous metastatic lesions. No evidence of new metastatic disease. -She is clinically stable, does have  fatigue and manageable diarrhea -Restaging bone scan from August 11, 2022 showed mild uptake in her ribs, indeterminate.  CT scan from yesterday showed stable/slight decreased liver lesions, mixed lytic and sclerotic bone lesion in L4 vertebral body, overall stable.  No other new lesions.  Patient was concerned about the hematoma from previous liver biopsy in 2022, I reviewed the multiple previous CT scans, her hematoma has resolved. -Patient reports injection related pain, so she requested her dose to reduce to half (one injection) last month, and wants to do half dose again today. I encourage her to get full dose (2 injections) and do half dose next time if she still has injection pain next time, she agreed.  -plan to repeat scan in 01/2023      Anxiety and depression -she is on 20 mg generic Prozac. She does not feel this is helping for her new anxiety surrounding her cancer. -I prescribed Xanax on 05/30/21 for her to use as needed. She is no longer taking daily. -she previously declined referral to counseling. -Continue Prozac   Crohn disease (HCC) -Follow-up with Dr. Rhea Belton -Due to her worsening diarrhea recently, she was seen by Dr. Rhea Belton and his PA on 06/12/22      PLAN: -lab reviewed -Tumor marker-pending -proceed with the Fulvestrant Injection today full dose and in 1 month 1/2 dose -lab /fu in 1 month   SUMMARY OF ONCOLOGIC HISTORY: Oncology History Overview Note  Cancer of lower-inner quadrant of left female breast [facility])   Staging form: Breast, AJCC 7th Edition     Clinical stage from 12/06/2014: Stage IA (T1c, N0, M0) - Unsigned     Pathologic stage from 12/13/2014: Stage IA (  T1c, N0, cM0) - Unsigned      Cancer of lower-inner quadrant of left female breast (HCC)  11/28/2014 Breast US   Highly suspicious 1.6 cm irregular shadowing mass at the site of palpable concern in the subareolar lower inner quadrant of the left breast at approximately 630.   11/29/2014 Receptors her2    Estrogen Receptor: 100%, POSITIVE, STRONG STAINING INTENSITY (PERFORMED MANUALLY) Progesterone Receptor: 95%, POSITIVE, STRONG STAINING INTENSITY (PERFORMED MANUALLY) Proliferation Marker Ki67: 10% (PERFORMED MANUALLY), Her2neu negative   11/29/2014 Initial Biopsy   Breast, left, needle core biopsy, subareolar about 6:30 - INVASIVE DUCTAL CARCINOMA, SEE COMMENT.   11/30/2014 Initial Diagnosis   Cancer of lower-inner quadrant of left female breast   12/13/2014 Pathology Results   Left breast lumpectomy showed a 1.7 cm invasive ductal carcinoma, grade 1, margins were negative, (+) LVI, 5 Sentinel lymph nodes were negative,   12/13/2014 Oncotype testing   RS 4, which predicts 10-year risk of distant recurrence 4% with tamoxifen alone.   02/05/2015 - 02/26/2015 Radiation Therapy   left breast radiaiton    03/18/2015 -  Anti-estrogen oral therapy   She tried letrozole for 2-3 weeks, could not tolerate and stopped.   07/25/2020 Pathology Results   FINAL MICROSCOPIC DIAGNOSIS: A. SKIN, LEFT CHEST, EXCISION: - Invasive carcinoma involving dermis and subcutaneous soft tissue, consistent with patient's clinical history of invasive ductal carcinoma, see comment - Deep and lateral resection margins are negative for carcinoma  Appears grade I  ADDENDUM: PROGNOSTIC INDICATOR RESULTS: Immunohistochemical and morphometric analysis performed manually The tumor cells are NEGATIVE for Her2 (1+). Estrogen Receptor: POSITIVE, 90%, STRONG STAINING Progesterone Receptor: POSITIVE, 95%, STRONG STAINING Proliferation Marker Ki-67: 5%    04/19/2021 Imaging   EXAM: CT ABDOMEN AND PELVIS WITH CONTRAST  IMPRESSION: 1. Multiple hypodense hepatic masses, as detailed above, highly suspicious for metastatic disease. Differential include infectious/inflammatory process. Further evaluation with MRI examination with contrast would be helpful. 2. Moderate left and small right pleural effusion. 3. Postsurgical  changes for prior hemicolectomy. No bowel obstruction, colitis or diverticulitis.   04/24/2021 Pathology Results   FINAL MICROSCOPIC DIAGNOSIS:   A. LIVER, BIOPSY:  -  Metastatic carcinoma  -  See comment   COMMENT:  By immunohistochemistry, the neoplastic cells are positive for  cytokeratin 7, ER (100%, strong), GATA3 and PAX8 (patchy weak) but negative for cytokeratin 20 and TTF-1.  Overall, the morphology and immunophenotype supports metastasis from the patient's known mammary carcinoma.   ADDENDUM:  By immunohistochemistry, HER-2 is EQUIVOCAL (2+).    ADDENDUM:  FLOURESCENCE IN-SITU HYBRIDIZATION RESULTS:  GROUP 5:   HER2 **NEGATIVE**    05/11/2021 Imaging   EXAM: CT CHEST WITH CONTRAST  IMPRESSION: 1. Bilateral pleural effusions and lower lobe compressive atelectasis, left greater than right. 2. Nonspecific right hilar lymphadenopathy. 3. Bony lytic lesions involving the left posterior fifth rib, left T5 transverse process, and left aspect of the T10 vertebral body, consistent with bony metastases. 4. Patchy right upper lobe ground-glass airspace disease, which may be inflammatory or infectious. 5. Slight decrease in size of the left lobe subcapsular liver hematoma. No evidence of active hemorrhage. 6. Stable liver hypodensities consistent with presumed metastatic disease. Please correlate with previous biopsy results.   05/13/2021 Pathology Results   Specimen Submitted:  A. PLEURAL FLUID, LEFT, THORACENTESIS:   FINAL MICROSCOPIC DIAGNOSIS:  - Atypical mesothelial cells present  - Chronic inflammation       INTERVAL HISTORY:  Sara Barnes is here for a follow up  of metastatic breast cancer . She was last seen by me on 08/14/2022. She presents to the clinic accompanied by sister . Pt state that she has been eating a lot of sweets. Pt state that she is still having pain in her hip from the Fulvestrant injection.   All other systems were reviewed with the patient  and are negative.  MEDICAL HISTORY:  Past Medical History:  Diagnosis Date   Allergy    Anemia    Anxiety    Arthritis    Basal cell carcinoma    Blood transfusion without reported diagnosis    Bowel perforation (HCC)    Cancer of lower-inner quadrant of female breast (HCC) 11/30/2014   left   Cancer of lower-inner quadrant of left female breast (HCC) 11/30/2014   Clostridium difficile infection    Complication of anesthesia    anxiety post-op after ear surgeries   Crohn's disease of large intestine with abscess (HCC)    Dental crowns present    recent root canal 11/2014   Depression    Family history of adverse reaction to anesthesia    pt's sister has hx. 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   Seasonal allergies    TMJ (temporomandibular joint syndrome)    Ulcerative colitis    Vitamin B12 deficiency     SURGICAL HISTORY: Past Surgical History:  Procedure Laterality Date   ABDOMINAL HYSTERECTOMY     partial   APPENDECTOMY     AUGMENTATION MAMMAPLASTY Bilateral    BASAL CELL CARCINOMA EXCISION Left    nose   BREAST BIOPSY Left 2019   benign   BREAST ENHANCEMENT SURGERY     BREAST LUMPECTOMY Left 2016   BREAST LUMPECTOMY WITH RADIOACTIVE SEED AND SENTINEL LYMPH NODE BIOPSY Left 12/13/2014   Procedure: LEFT BREAST LUMPECTOMY WITH RADIOACTIVE SEED AND SENTINEL LYMPH NODE BIOPSY;  Surgeon: [name], MD;  Location: [facility];  Service: General;  Laterality: Left;   COLONOSCOPY WITH PROPOFOL  05/28/2011; 06/28/2014   EXPLORATORY LAPAROTOMY  04/08/2018   with end ileostomy takedown and creation of loop ileostomy-- [facility]-Dr Inetta Fermo   FLEXIBLE SIGMOIDOSCOPY N/A 01/08/2016   Procedure: Arnell Sieving;  Surgeon: Ruffin Frederick, MD;  Location: [facility];  Service: Gastroenterology;  Laterality: N/A;  needs MAC due to pain and anxiety   ILEOSTOMY N/A 05/25/2017   Procedure: DILATION OF ILEOSTOMY;  Surgeon: Emelia Loron, MD;  Location: [facility];  Service: General;  Laterality: N/A;   ILEOSTOMY  02/11/2016   END ileostomy.  Dr Daphine Deutscher   ILEOSTOMY CLOSURE N/A 06/02/2017   Procedure: RESECTION OF ILEAL STRICTURE; ILEOSTOMY REVISION ;  Surgeon: Karie Soda, MD;  Location: [facility];  Service: General;  Laterality: N/A;   ILEOSTOMY DILATION  05/25/2017   DILITATION OF ILEAL STRICTURE   ileostomy stricture resection  06/02/2017   with revision   INCISION AND DRAINAGE ABSCESS  2018   INCISION AND DRAINAGE PERIRECTAL ABSCESS N/A 09/28/2013   Procedure: IRRIGATION AND DEBRIDEMENT PERIANAL ABSCESS, proctoscopy;  Surgeon: Kandis Cocking, MD;  Location: [facility];  Service: General;  Laterality:  N/A;   INCISION AND DRAINAGE PERIRECTAL ABSCESS N/A 01/22/2016   Procedure: IRRIGATION AND DEBRIDEMENT PERIRECTAL ABSCESS;  Surgeon: Darnell Level, MD;  Location: [facility];  Service: General;  Laterality: N/A;   INNER EAR SURGERY Bilateral    LAPAROSCOPIC DIVERTED COLOSTOMY N/A 02/11/2016   Procedure: LAPAROSCOPIC DIVERTED ILEOSTOMY;  Surgeon: Luretha Murphy, MD;  Location: [facility];  Service: General;  Laterality: N/A;   LAPAROSCOPY N/A 03/23/2017   Procedure: LAPAROSCOPY DIAGNOSTIC, EXPLORATORY LAPAROTOMY,  BOWEL RESECTION;  Surgeon: Avel Peace, MD;  Location: [facility];  Service: General;  Laterality: N/A;    I have reviewed the social history and family history with the patient and they are unchanged from previous note.  ALLERGIES:  is allergic to nsaids, codeine, mesalamine, oxycodone, lactose intolerance (gi), norco [hydrocodone-acetaminophen], other, and sulfa antibiotics.  MEDICATIONS:  Current Outpatient Medications   Medication Sig Dispense Refill   acetaminophen (TYLENOL) 500 MG tablet You can take 2 tablets every 6 hours as needed for pain.  Use this as your primary pain control.  Use Tramadol as a last resort.  You can buy this over the counter at any drug store.    DO NOT TAKE MORE THAN 4000 MG OF TYLENOL PER DAY.  IT CAN HARM YOUR LIVER. 30 tablet 0   ALPRAZolam (XANAX) 0.25 MG tablet TAKE 1 TO 2 TABLETS(0.25 TO 0.5 MG) BY MOUTH TWICE DAILY AS NEEDED FOR ANXIETY 60 tablet 0   cephALEXin (KEFLEX) 500 MG capsule Take 1 capsule (500 mg total) by mouth 2 (two) times daily. 14 capsule 0   cyanocobalamin (VITAMIN B12) 1000 MCG/ML injection INJECT INTO THE MUSCLE ONCE PER MONTH 3 mL 11   diphenoxylate-atropine (LOMOTIL) 2.5-0.025 MG tablet Take 1-2 tablets by mouth 3-4 times daily AS NEEDED for severe diarrhea 240 tablet 6   famotidine (PEPCID) 20 MG tablet Take 1 tablet (20 mg total) by mouth 2 (two) times daily. (Patient taking differently: Take 20 mg by mouth daily as needed for indigestion.) 180 tablet 6   FLUoxetine (PROZAC) 20 MG capsule TAKE 1 CAPSULE(20 MG) BY MOUTH DAILY 90 capsule 1   fluticasone (FLONASE) 50 MCG/ACT nasal spray Place 2 sprays into both nostrils daily. (Patient taking differently: Place 2 sprays into both nostrils daily as needed for rhinitis.) 16 g 6   loperamide (IMODIUM) 2 MG capsule Take 1 capsule (2 mg total) by mouth as needed for diarrhea or loose stools. 90 capsule 1   ondansetron (ZOFRAN-ODT) 4 MG disintegrating tablet DISSOLVE ONE TABLET BY MOUTH EVERY 6 HOURS AS NEEDED FOR NAUSEA AND VOMITING 30 tablet 3   predniSONE (DELTASONE) 5 MG tablet 1 po qd 90 tablet 3   zolpidem (AMBIEN) 10 MG tablet TAKE 1 TABLET(10 MG) BY MOUTH AT BEDTIME AS NEEDED FOR SLEEP 30 tablet 0   No current facility-administered medications for this visit.   Facility-Administered Medications Ordered in Other Visits  Medication Dose Route Frequency Provider Last Rate Last Admin   fulvestrant  (FASLODEX) injection 500 mg  500 mg Intramuscular Q30 days Malachy Mood, MD   500 mg at 10/10/22 1519    PHYSICAL EXAMINATION: ECOG PERFORMANCE STATUS: 2 - Symptomatic, <50% confined to bed  Vitals:   10/10/22 1311  BP: (!) 140/83  Pulse: 81  Resp: 18  Temp: 98.6 F (37 C)  SpO2: 97%   Wt Readings from Last 3 Encounters:  10/10/22 156 lb (70.8 kg)  08/14/22 155 lb 1.6 oz (70.4 kg)  07/18/22 154 lb 4.8 oz (70 kg)  GENERAL:alert, no distress and comfortable SKIN: skin color normal, no rashes or significant lesions EYES: normal, Conjunctiva are pink and non-injected, sclera clear  NEURO: alert & oriented x 3 with fluent speech  LABORATORY DATA:  I have reviewed the data as listed    Latest Ref Rng & Units 10/10/2022   12:40 PM 08/11/2022   11:14 AM 07/18/2022   12:57 PM  CBC  WBC 4.0 - 10.5 K/uL 13.1  10.5  10.7   Hemoglobin 12.0 - 15.0 g/dL 40.9  81.1  91.4   Hematocrit 36.0 - 46.0 % 41.4  42.8  41.1   Platelets 150 - 400 K/uL 276  256  242         Latest Ref Rng & Units 10/10/2022   12:40 PM 08/11/2022   11:14 AM 07/18/2022   12:57 PM  CMP  Glucose 70 - 99 mg/dL 782  956  213   BUN 8 - 23 mg/dL 13  14  15    Creatinine 0.44 - 1.00 mg/dL 0.86  5.78  4.69   Sodium 135 - 145 mmol/L 137  135  131   Potassium 3.5 - 5.1 mmol/L 4.8  4.2  4.3   Chloride 98 - 111 mmol/L 104  105  99   CO2 22 - 32 mmol/L 25  23  25    Calcium 8.9 - 10.3 mg/dL 9.5  9.4  9.1   Total Protein 6.5 - 8.1 g/dL 6.7  6.9  6.6   Total Bilirubin 0.3 - 1.2 mg/dL 0.5  0.6  0.7   Alkaline Phos 38 - 126 U/L 82  67  64   AST 15 - 41 U/L 13  13  13    ALT 0 - 44 U/L 10  9  10        RADIOGRAPHIC STUDIES: I have personally reviewed the radiological images as listed and agreed with the findings in the report. No results found.    No orders of the defined types were placed in this encounter.  All questions were answered. The patient knows to call the clinic with any problems, questions or concerns. No  barriers to learning was detected. The total time spent in the appointment was 25 minutes.     Malachy Mood, MD 10/10/2022   Carolin Coy, CMA, am acting as scribe for Malachy Mood, MD.   I have reviewed the above documentation for accuracy and completeness, and I agree with the above.

## 2022-10-10 NOTE — Progress Notes (Signed)
Pt. Seen Dr. Mosetta Putt and was here for the Faslodex injections.  Medication released and authorized via pharmacy.  Medication taken out of the refrigerator to be warmed up before given.  Pt. Became upset and had increased anxiety due to waiting.  She refused to wait for the injections while it warmed up.  She left and went to see [facility]/RN Dr. Latanya Maudlin nurse.  Injection administered via [facility]/RN.

## 2022-10-11 LAB — CANCER ANTIGEN 27.29: CA 27.29: 28.8 U/mL (ref 0.0–38.6)

## 2022-10-14 ENCOUNTER — Telehealth: Payer: Self-pay | Admitting: Hematology

## 2022-10-14 NOTE — Telephone Encounter (Signed)
Contacted patient to scheduled appointments. Patient is aware of appointments that are scheduled.   

## 2022-10-17 ENCOUNTER — Telehealth: Payer: Self-pay

## 2022-10-17 NOTE — Telephone Encounter (Signed)
Called patient back and listened to her concerns. Lorayne Marek, RN

## 2022-10-17 NOTE — Telephone Encounter (Signed)
Listened to Sara Barnes's concerns about her last experience at the cancer center.  She hopes that they were just having a bad day but I assured her that her next appt would not result in that type of behavior.  Sara Marek, RN

## 2022-11-07 ENCOUNTER — Inpatient Hospital Stay: Payer: Medicare HMO

## 2022-11-07 ENCOUNTER — Encounter: Payer: Self-pay | Admitting: Hematology

## 2022-11-07 ENCOUNTER — Inpatient Hospital Stay: Payer: Medicare HMO | Attending: Hematology

## 2022-11-07 ENCOUNTER — Inpatient Hospital Stay: Payer: Medicare HMO | Admitting: Hematology

## 2022-11-07 DIAGNOSIS — C7951 Secondary malignant neoplasm of bone: Secondary | ICD-10-CM | POA: Insufficient documentation

## 2022-11-07 DIAGNOSIS — F418 Other specified anxiety disorders: Secondary | ICD-10-CM | POA: Insufficient documentation

## 2022-11-07 DIAGNOSIS — Z90710 Acquired absence of both cervix and uterus: Secondary | ICD-10-CM | POA: Diagnosis not present

## 2022-11-07 DIAGNOSIS — G4700 Insomnia, unspecified: Secondary | ICD-10-CM

## 2022-11-07 DIAGNOSIS — C50919 Malignant neoplasm of unspecified site of unspecified female breast: Secondary | ICD-10-CM

## 2022-11-07 DIAGNOSIS — E871 Hypo-osmolality and hyponatremia: Secondary | ICD-10-CM | POA: Diagnosis not present

## 2022-11-07 DIAGNOSIS — Z170 Estrogen receptor positive status [ER+]: Secondary | ICD-10-CM

## 2022-11-07 DIAGNOSIS — K5090 Crohn's disease, unspecified, without complications: Secondary | ICD-10-CM | POA: Diagnosis not present

## 2022-11-07 DIAGNOSIS — C787 Secondary malignant neoplasm of liver and intrahepatic bile duct: Secondary | ICD-10-CM | POA: Insufficient documentation

## 2022-11-07 DIAGNOSIS — C50312 Malignant neoplasm of lower-inner quadrant of left female breast: Secondary | ICD-10-CM | POA: Diagnosis not present

## 2022-11-07 DIAGNOSIS — Z5111 Encounter for antineoplastic chemotherapy: Secondary | ICD-10-CM | POA: Diagnosis not present

## 2022-11-07 DIAGNOSIS — R160 Hepatomegaly, not elsewhere classified: Secondary | ICD-10-CM

## 2022-11-07 LAB — CBC WITH DIFFERENTIAL (CANCER CENTER ONLY)
Abs Immature Granulocytes: 0.03 10*3/uL (ref 0.00–0.07)
Basophils Absolute: 0 10*3/uL (ref 0.0–0.1)
Basophils Relative: 0 %
Eosinophils Absolute: 0 10*3/uL (ref 0.0–0.5)
Eosinophils Relative: 0 %
HCT: 42.4 % (ref 36.0–46.0)
Hemoglobin: 14.2 g/dL (ref 12.0–15.0)
Immature Granulocytes: 0 %
Lymphocytes Relative: 10 %
Lymphs Abs: 1 10*3/uL (ref 0.7–4.0)
MCH: 31.6 pg (ref 26.0–34.0)
MCHC: 33.5 g/dL (ref 30.0–36.0)
MCV: 94.4 fL (ref 80.0–100.0)
Monocytes Absolute: 0.7 10*3/uL (ref 0.1–1.0)
Monocytes Relative: 7 %
Neutro Abs: 8.1 10*3/uL — ABNORMAL HIGH (ref 1.7–7.7)
Neutrophils Relative %: 83 %
Platelet Count: 228 10*3/uL (ref 150–400)
RBC: 4.49 MIL/uL (ref 3.87–5.11)
RDW: 11.8 % (ref 11.5–15.5)
WBC Count: 9.9 10*3/uL (ref 4.0–10.5)
nRBC: 0 % (ref 0.0–0.2)

## 2022-11-07 LAB — CMP (CANCER CENTER ONLY)
ALT: 8 U/L (ref 0–44)
AST: 13 U/L — ABNORMAL LOW (ref 15–41)
Albumin: 3.7 g/dL (ref 3.5–5.0)
Alkaline Phosphatase: 75 U/L (ref 38–126)
Anion gap: 6 (ref 5–15)
BUN: 16 mg/dL (ref 8–23)
CO2: 24 mmol/L (ref 22–32)
Calcium: 9.1 mg/dL (ref 8.9–10.3)
Chloride: 99 mmol/L (ref 98–111)
Creatinine: 0.95 mg/dL (ref 0.44–1.00)
GFR, Estimated: >60 mL/min (ref >60)
Glucose, Bld: 93 mg/dL (ref 70–99)
Potassium: 4.5 mmol/L (ref 3.5–5.1)
Sodium: 129 mmol/L — ABNORMAL LOW (ref 135–145)
Total Bilirubin: 0.5 mg/dL (ref 0.3–1.2)
Total Protein: 6.7 g/dL (ref 6.5–8.1)

## 2022-11-07 MED ORDER — ALPRAZOLAM 0.25 MG PO TABS
0.2500 mg | ORAL_TABLET | Freq: Two times a day (BID) | ORAL | 0 refills | Status: DC | PRN
Start: 2022-11-07 — End: 2023-01-01

## 2022-11-07 MED ORDER — ZOLPIDEM TARTRATE 10 MG PO TABS
ORAL_TABLET | ORAL | 0 refills | Status: DC
Start: 2022-11-07 — End: 2022-12-05

## 2022-11-07 MED ORDER — FULVESTRANT 250 MG/5ML IM SOSY
500.0000 mg | PREFILLED_SYRINGE | INTRAMUSCULAR | Status: DC
  Administered 2022-11-07: 500 mg via INTRAMUSCULAR
  Filled 2022-11-07: qty 10

## 2022-11-07 NOTE — Patient Instructions (Signed)

## 2022-11-07 NOTE — Progress Notes (Signed)
[facility]   [phone] Fax:([phone]   Clinic Follow up Note   Patient Care Team: [name], [name], DO as PCP - General [name], MD as Consulting Physician (Breast Surgery) Pyrtle, Carie Caddy, MD as Consulting Physician (Gastroenterology) [name], MD as Referring Physician (Dermatology) Allena Napoleon, MD as Consulting Physician (Plastic Surgery) Malachy Mood, MD as Consulting Physician (Hematology)  Date of Service:  11/07/2022  CHIEF COMPLAINT: f/u of metastatic breast cancer     CURRENT THERAPY:   Fulvestrant injections, starting 05/15/21, now q4weeks    ASSESSMENT:  Sara Barnes is a 82 y.o. 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 Donell Beers. Oncotype RS of 4 (low risk). She received adjuvant radiation. She attempted letrozole previously but did not tolerate due to colitis (pt has Crohn's). -she developed a rash in early 2022. Excisional biopsy by Dr. Arita Miss on 07/25/20 confirmed invasive carcinoma, felt to represent recurrence of breast cancer. She declined further work up with [place] PET scan and was subsequently lost to f/u. -she developed liver and bone mets in 03/2021.  -Due to her colitis, she may not tolerate CDK4/6 inhibitor well (due to potential diarrhea). After a lengthy discussion, she agreed to fulvestrant injections. She received her first dose on 05/15/21. She has tolerated well. -baseline CA 27.29 06/14/21 was elevated at 375.9. This has been steadily decreasing on treatment -restaging CT CAP 02/25/22 showed good treatment response: improvement to liver lesions and bilateral pleural effusions, resolution of LLQ nodule, stable osseous metastatic lesions. No evidence of new metastatic disease. -She is clinically stable, does have  fatigue and manageable diarrhea -Restaging bone scan from August 11, 2022 showed mild uptake in her ribs, indeterminate.  CT scan from yesterday showed stable/slight decreased liver lesions, mixed lytic and sclerotic bone lesion in L4 vertebral body, overall stable.  No other new lesions.  Patient was concerned about the hematoma from previous liver biopsy in 2022, I reviewed the multiple previous CT scans, her hematoma has resolved. -Patient reports injection related pain, so she requested her dose to reduce to half in May 2024.  With encouragement, she agreed to return to full dose, and tolerated well afterwards -plan to repeat scan in 4 months, since her tumor marker CA 27.29 has been normal.     Anxiety and depression -she is on 20 mg generic Prozac. She does not feel this is helping for her new anxiety surrounding her cancer. -I prescribed Xanax on 05/30/21 for her to use as needed. She is no longer taking daily. -she previously declined referral to counseling. -Continue Prozac   Crohn disease (HCC) -Follow-up with Dr. Rhea Belton -Due to her worsening diarrhea recently, she was seen by Dr. Rhea Belton and his PA on 06/12/22  Hyponatremia -Chronic, worse today, sodium 129, she is asymptomatic. -Likely multifactorial, related to her Prozac, excessive water intake etc. I encouraged her to drink water with electrolytes, such as liquid IV, and add additional salt to her diet. -Will monitor   PLAN: -lab reviewed  Sodium -129 -Will proceed with fulvestrant injection today and continue every 4 weeks -I refill Ambien and Xanax -I recommend Liquid IV for electrolytes and sodium -Lab and follow-up in 2 months, order CT scan next visit.   SUMMARY OF ONCOLOGIC HISTORY: Oncology History Overview Note  Cancer of lower-inner quadrant of left female breast [facility])   Staging form: Breast, AJCC 7th Edition     Clinical stage from 12/06/2014: Stage IA (T1c, N0, M0) - Unsigned     Pathologic stage from 12/13/2014:  Stage IA (T1c, N0, cM0) - Unsigned      Cancer of lower-inner quadrant of left female breast (HCC)  11/28/2014 Breast US   Highly suspicious 1.6 cm irregular shadowing mass at the site of palpable concern in the subareolar lower inner quadrant of the left breast at approximately 630.   11/29/2014 Receptors her2   Estrogen Receptor: 100%, POSITIVE, STRONG STAINING INTENSITY (PERFORMED MANUALLY) Progesterone Receptor: 95%, POSITIVE, STRONG STAINING INTENSITY (PERFORMED MANUALLY) Proliferation Marker Ki67: 10% (PERFORMED MANUALLY), Her2neu negative   11/29/2014 Initial Biopsy   Breast, left, needle core biopsy, subareolar about 6:30 - INVASIVE DUCTAL CARCINOMA, SEE COMMENT.   11/30/2014 Initial Diagnosis   Cancer of lower-inner quadrant of left female breast   12/13/2014 Pathology Results   Left breast lumpectomy showed a 1.7 cm invasive ductal carcinoma, grade 1, margins were negative, (+) LVI, 5 Sentinel lymph nodes were negative,   12/13/2014 Oncotype testing   RS 4, which predicts 10-year risk of distant recurrence 4% with tamoxifen alone.   02/05/2015 - 02/26/2015 Radiation Therapy   left breast radiaiton    03/18/2015 -  Anti-estrogen oral therapy   She tried letrozole for 2-3 weeks, could not tolerate and stopped.   07/25/2020 Pathology Results   FINAL MICROSCOPIC DIAGNOSIS: A. SKIN, LEFT CHEST, EXCISION: - Invasive carcinoma involving dermis and subcutaneous soft tissue, consistent with patient's clinical history of invasive ductal carcinoma, see comment - Deep and lateral resection margins are negative for carcinoma  Appears grade I  ADDENDUM: PROGNOSTIC INDICATOR RESULTS: Immunohistochemical and morphometric analysis performed manually The tumor cells are NEGATIVE for Her2 (1+). Estrogen Receptor: POSITIVE, 90%, STRONG STAINING Progesterone Receptor: POSITIVE, 95%, STRONG STAINING Proliferation Marker Ki-67: 5%    04/19/2021 Imaging   EXAM: CT ABDOMEN AND PELVIS WITH  CONTRAST  IMPRESSION: 1. Multiple hypodense hepatic masses, as detailed above, highly suspicious for metastatic disease. Differential include infectious/inflammatory process. Further evaluation with MRI examination with contrast would be helpful. 2. Moderate left and small right pleural effusion. 3. Postsurgical changes for prior hemicolectomy. No bowel obstruction, colitis or diverticulitis.   04/24/2021 Pathology Results   FINAL MICROSCOPIC DIAGNOSIS:   A. LIVER, BIOPSY:  -  Metastatic carcinoma  -  See comment   COMMENT:  By immunohistochemistry, the neoplastic cells are positive for  cytokeratin 7, ER (100%, strong), GATA3 and PAX8 (patchy weak) but negative for cytokeratin 20 and TTF-1.  Overall, the morphology and immunophenotype supports metastasis from the patient's known mammary carcinoma.   ADDENDUM:  By immunohistochemistry, HER-2 is EQUIVOCAL (2+).    ADDENDUM:  FLOURESCENCE IN-SITU HYBRIDIZATION RESULTS:  GROUP 5:   HER2 **NEGATIVE**    05/11/2021 Imaging   EXAM: CT CHEST WITH CONTRAST  IMPRESSION: 1. Bilateral pleural effusions and lower lobe compressive atelectasis, left greater than right. 2. Nonspecific right hilar lymphadenopathy. 3. Bony lytic lesions involving the left posterior fifth rib, left T5 transverse process, and left aspect of the T10 vertebral body, consistent with bony metastases. 4. Patchy right upper lobe ground-glass airspace disease, which may be inflammatory or infectious. 5. Slight decrease in size of the left lobe subcapsular liver hematoma. No evidence of active hemorrhage. 6. Stable liver hypodensities consistent with presumed metastatic disease. Please correlate with previous biopsy results.   05/13/2021 Pathology Results  Specimen Submitted:  A. PLEURAL FLUID, LEFT, THORACENTESIS:   FINAL MICROSCOPIC DIAGNOSIS:  - Atypical mesothelial cells present  - Chronic inflammation       INTERVAL HISTORY:  Sara Barnes is here  for a follow up of metastatic breast cancer . She was last seen by me on 10/10/2022. She presents to the clinic accompanied by sister Pt state that she was dong good. Pt state that she is eating but she has some diarrhea issues. Pt state that she is able to do her ADLs, but has to lay down afterwards.Pt had no issues with the injection.     All other systems were reviewed with the patient and are negative.  MEDICAL HISTORY:  Past Medical History:  Diagnosis Date   Allergy    Anemia    Anxiety    Arthritis    Basal cell carcinoma    Blood transfusion without reported diagnosis    Bowel perforation (HCC)    Cancer of lower-inner quadrant of female breast (HCC) 11/30/2014   left   Cancer of lower-inner quadrant of left female breast (HCC) 11/30/2014   Clostridium difficile infection    Complication of anesthesia    anxiety post-op after ear surgeries   Crohn's disease of large intestine with abscess (HCC)    Dental crowns present    recent root canal 11/2014   Depression    Family history of adverse reaction to anesthesia    pt's sister has hx. 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   Seasonal allergies    TMJ (temporomandibular joint syndrome)    Ulcerative colitis    Vitamin B12 deficiency     SURGICAL HISTORY: Past Surgical History:  Procedure Laterality Date   ABDOMINAL HYSTERECTOMY     partial   APPENDECTOMY     AUGMENTATION MAMMAPLASTY Bilateral    BASAL CELL CARCINOMA EXCISION Left    nose   BREAST BIOPSY Left 2019   benign   BREAST ENHANCEMENT SURGERY     BREAST LUMPECTOMY Left 2016   BREAST LUMPECTOMY WITH RADIOACTIVE SEED AND SENTINEL LYMPH NODE BIOPSY Left  12/13/2014   Procedure: LEFT BREAST LUMPECTOMY WITH RADIOACTIVE SEED AND SENTINEL LYMPH NODE BIOPSY;  Surgeon: [name], MD;  Location: [facility];  Service: General;  Laterality: Left;   COLONOSCOPY WITH PROPOFOL  05/28/2011; 06/28/2014   EXPLORATORY LAPAROTOMY  04/08/2018   with end ileostomy takedown and creation of loop ileostomy-- [facility]-Dr Inetta Fermo   FLEXIBLE SIGMOIDOSCOPY N/A 01/08/2016   Procedure: Arnell Sieving;  Surgeon: Ruffin Frederick, MD;  Location: [facility];  Service: Gastroenterology;  Laterality: N/A;  needs MAC due to pain and anxiety   ILEOSTOMY N/A 05/25/2017   Procedure: DILATION OF ILEOSTOMY;  Surgeon: Emelia Loron, MD;  Location: [facility];  Service: General;  Laterality: N/A;   ILEOSTOMY  02/11/2016   END ileostomy.  Dr Daphine Deutscher   ILEOSTOMY CLOSURE N/A 06/02/2017   Procedure: RESECTION OF ILEAL STRICTURE; ILEOSTOMY REVISION ;  Surgeon: Karie Soda, MD;  Location: [facility];  Service: General;  Laterality: N/A;   ILEOSTOMY DILATION  05/25/2017  DILITATION OF ILEAL STRICTURE   ileostomy stricture resection  06/02/2017   with revision   INCISION AND DRAINAGE ABSCESS  2018   INCISION AND DRAINAGE PERIRECTAL ABSCESS N/A 09/28/2013   Procedure: IRRIGATION AND DEBRIDEMENT PERIANAL ABSCESS, proctoscopy;  Surgeon: Kandis Cocking, MD;  Location: [facility];  Service: General;  Laterality: N/A;   INCISION AND DRAINAGE PERIRECTAL ABSCESS N/A 01/22/2016   Procedure: IRRIGATION AND DEBRIDEMENT PERIRECTAL ABSCESS;  Surgeon: Darnell Level, MD;  Location: [facility];  Service: General;  Laterality: N/A;   INNER EAR SURGERY Bilateral    LAPAROSCOPIC DIVERTED COLOSTOMY N/A 02/11/2016   Procedure: LAPAROSCOPIC DIVERTED ILEOSTOMY;  Surgeon: Luretha Murphy, MD;  Location: [facility];  Service: General;  Laterality: N/A;   LAPAROSCOPY N/A 03/23/2017   Procedure: LAPAROSCOPY DIAGNOSTIC, EXPLORATORY LAPAROTOMY,  BOWEL RESECTION;  Surgeon:  Avel Peace, MD;  Location: [facility];  Service: General;  Laterality: N/A;    I have reviewed the social history and family history with the patient and they are unchanged from previous note.  ALLERGIES:  is allergic to nsaids, codeine, mesalamine, oxycodone, lactose intolerance (gi), norco [hydrocodone-acetaminophen], other, and sulfa antibiotics.  MEDICATIONS:  Current Outpatient Medications  Medication Sig Dispense Refill   acetaminophen (TYLENOL) 500 MG tablet You can take 2 tablets every 6 hours as needed for pain.  Use this as your primary pain control.  Use Tramadol as a last resort.  You can buy this over the counter at any drug store.    DO NOT TAKE MORE THAN 4000 MG OF TYLENOL PER DAY.  IT CAN HARM YOUR LIVER. 30 tablet 0   ALPRAZolam (XANAX) 0.25 MG tablet Take 1 tablet (0.25 mg total) by mouth 2 (two) times daily as needed for anxiety. 60 tablet 0   amLODipine (NORVASC) 10 MG tablet Take by mouth.     cephALEXin (KEFLEX) 500 MG capsule Take 1 capsule (500 mg total) by mouth 2 (two) times daily. 14 capsule 0   cyanocobalamin (VITAMIN B12) 1000 MCG/ML injection INJECT INTO THE MUSCLE ONCE PER MONTH 3 mL 11   diphenoxylate-atropine (LOMOTIL) 2.5-0.025 MG tablet Take 1-2 tablets by mouth 3-4 times daily AS NEEDED for severe diarrhea 240 tablet 6   famotidine (PEPCID) 20 MG tablet Take 1 tablet (20 mg total) by mouth 2 (two) times daily. (Patient taking differently: Take 20 mg by mouth daily as needed for indigestion.) 180 tablet 6   FLUoxetine (PROZAC) 20 MG capsule TAKE 1 CAPSULE(20 MG) BY MOUTH DAILY 90 capsule 1   fluticasone (FLONASE) 50 MCG/ACT nasal spray Place 2 sprays into both nostrils daily. (Patient taking differently: Place 2 sprays into both nostrils daily as needed for rhinitis.) 16 g 6   loperamide (IMODIUM) 2 MG capsule Take 1 capsule (2 mg total) by mouth as needed for diarrhea or loose stools. 90 capsule 1   ondansetron (ZOFRAN-ODT) 4 MG disintegrating tablet  DISSOLVE ONE TABLET BY MOUTH EVERY 6 HOURS AS NEEDED FOR NAUSEA AND VOMITING 30 tablet 3   predniSONE (DELTASONE) 5 MG tablet 1 po qd 90 tablet 3   white petrolatum ointment Apply topically.     zolpidem (AMBIEN) 10 MG tablet TAKE 1 TABLET(10 MG) BY MOUTH AT BEDTIME AS NEEDED FOR SLEEP 30 tablet 0   No current facility-administered medications for this visit.   Facility-Administered Medications Ordered in Other Visits  Medication Dose Route Frequency Provider Last Rate Last Admin   fulvestrant (FASLODEX) injection 500 mg  500 mg Intramuscular Q30 days Malachy Mood, MD  500 mg at 11/07/22 1424    PHYSICAL EXAMINATION: ECOG PERFORMANCE STATUS: 2 - Symptomatic, <50% confined to bed  Vitals:   11/07/22 1337  BP: 139/78  Pulse: 70  Resp: 20  Temp: 98.1 F (36.7 C)  SpO2: 100%   Wt Readings from Last 3 Encounters:  11/07/22 157 lb 4.8 oz (71.4 kg)  10/10/22 156 lb (70.8 kg)  08/14/22 155 lb 1.6 oz (70.4 kg)     GENERAL:alert, no distress and comfortable SKIN: skin color normal, no rashes or significant lesions EYES: normal, Conjunctiva are pink and non-injected, sclera clear  NEURO: alert & oriented x 3 with fluent speech  LABORATORY DATA:  I have reviewed the data as listed    Latest Ref Rng & Units 11/07/2022   12:50 PM 10/10/2022   12:40 PM 08/11/2022   11:14 AM  CBC  WBC 4.0 - 10.5 K/uL 9.9  13.1  10.5   Hemoglobin 12.0 - 15.0 g/dL 16.1  09.6  04.5   Hematocrit 36.0 - 46.0 % 42.4  41.4  42.8   Platelets 150 - 400 K/uL 228  276  256         Latest Ref Rng & Units 11/07/2022   12:50 PM 10/10/2022   12:40 PM 08/11/2022   11:14 AM  CMP  Glucose 70 - 99 mg/dL 93  409  811   BUN 8 - 23 mg/dL 16  13  14    Creatinine 0.44 - 1.00 mg/dL 9.14  7.82  9.56   Sodium 135 - 145 mmol/L 129  137  135   Potassium 3.5 - 5.1 mmol/L 4.5  4.8  4.2   Chloride 98 - 111 mmol/L 99  104  105   CO2 22 - 32 mmol/L 24  25  23    Calcium 8.9 - 10.3 mg/dL 9.1  9.5  9.4   Total Protein 6.5 - 8.1  g/dL 6.7  6.7  6.9   Total Bilirubin 0.3 - 1.2 mg/dL 0.5  0.5  0.6   Alkaline Phos 38 - 126 U/L 75  82  67   AST 15 - 41 U/L 13  13  13    ALT 0 - 44 U/L 8  10  9        RADIOGRAPHIC STUDIES: I have personally reviewed the radiological images as listed and agreed with the findings in the report. No results found.    No orders of the defined types were placed in this encounter.  All questions were answered. The patient knows to call the clinic with any problems, questions or concerns. No barriers to learning was detected. The total time spent in the appointment was 30 minutes.     Malachy Mood, MD 11/07/2022   Carolin Coy, CMA, am acting as scribe for Malachy Mood, MD.   I have reviewed the above documentation for accuracy and completeness, and I agree with the above.

## 2022-11-10 ENCOUNTER — Telehealth: Payer: Self-pay | Admitting: Hematology

## 2022-12-05 ENCOUNTER — Inpatient Hospital Stay: Payer: Medicare HMO | Attending: Hematology

## 2022-12-05 ENCOUNTER — Other Ambulatory Visit: Payer: Self-pay | Admitting: Hematology

## 2022-12-05 ENCOUNTER — Other Ambulatory Visit: Payer: Self-pay

## 2022-12-05 VITALS — BP 117/81 | HR 81 | Temp 98.3°F | Resp 18

## 2022-12-05 DIAGNOSIS — Z170 Estrogen receptor positive status [ER+]: Secondary | ICD-10-CM | POA: Insufficient documentation

## 2022-12-05 DIAGNOSIS — C7951 Secondary malignant neoplasm of bone: Secondary | ICD-10-CM | POA: Insufficient documentation

## 2022-12-05 DIAGNOSIS — Z5111 Encounter for antineoplastic chemotherapy: Secondary | ICD-10-CM | POA: Diagnosis not present

## 2022-12-05 DIAGNOSIS — C792 Secondary malignant neoplasm of skin: Secondary | ICD-10-CM | POA: Insufficient documentation

## 2022-12-05 DIAGNOSIS — C50312 Malignant neoplasm of lower-inner quadrant of left female breast: Secondary | ICD-10-CM

## 2022-12-05 DIAGNOSIS — G4700 Insomnia, unspecified: Secondary | ICD-10-CM

## 2022-12-05 DIAGNOSIS — C50919 Malignant neoplasm of unspecified site of unspecified female breast: Secondary | ICD-10-CM

## 2022-12-05 DIAGNOSIS — R160 Hepatomegaly, not elsewhere classified: Secondary | ICD-10-CM

## 2022-12-05 DIAGNOSIS — C787 Secondary malignant neoplasm of liver and intrahepatic bile duct: Secondary | ICD-10-CM | POA: Insufficient documentation

## 2022-12-05 MED ORDER — FULVESTRANT 250 MG/5ML IM SOSY
500.0000 mg | PREFILLED_SYRINGE | INTRAMUSCULAR | Status: DC
  Administered 2022-12-05: 500 mg via INTRAMUSCULAR
  Filled 2022-12-05: qty 10

## 2023-01-01 ENCOUNTER — Inpatient Hospital Stay: Payer: Medicare HMO | Attending: Hematology

## 2023-01-01 ENCOUNTER — Inpatient Hospital Stay: Payer: Medicare HMO | Admitting: Hematology

## 2023-01-01 ENCOUNTER — Encounter: Payer: Self-pay | Admitting: Hematology

## 2023-01-01 ENCOUNTER — Inpatient Hospital Stay: Payer: Medicare HMO

## 2023-01-01 VITALS — BP 136/81 | HR 72 | Temp 98.0°F | Resp 18 | Ht 66.0 in | Wt 157.6 lb

## 2023-01-01 DIAGNOSIS — F32A Depression, unspecified: Secondary | ICD-10-CM | POA: Diagnosis not present

## 2023-01-01 DIAGNOSIS — C792 Secondary malignant neoplasm of skin: Secondary | ICD-10-CM | POA: Insufficient documentation

## 2023-01-01 DIAGNOSIS — C787 Secondary malignant neoplasm of liver and intrahepatic bile duct: Secondary | ICD-10-CM | POA: Insufficient documentation

## 2023-01-01 DIAGNOSIS — G4700 Insomnia, unspecified: Secondary | ICD-10-CM

## 2023-01-01 DIAGNOSIS — C7951 Secondary malignant neoplasm of bone: Secondary | ICD-10-CM | POA: Insufficient documentation

## 2023-01-01 DIAGNOSIS — C50919 Malignant neoplasm of unspecified site of unspecified female breast: Secondary | ICD-10-CM

## 2023-01-01 DIAGNOSIS — Z170 Estrogen receptor positive status [ER+]: Secondary | ICD-10-CM

## 2023-01-01 DIAGNOSIS — K50011 Crohn's disease of small intestine with rectal bleeding: Secondary | ICD-10-CM | POA: Diagnosis not present

## 2023-01-01 DIAGNOSIS — F419 Anxiety disorder, unspecified: Secondary | ICD-10-CM | POA: Diagnosis not present

## 2023-01-01 DIAGNOSIS — C50312 Malignant neoplasm of lower-inner quadrant of left female breast: Secondary | ICD-10-CM | POA: Diagnosis not present

## 2023-01-01 DIAGNOSIS — R160 Hepatomegaly, not elsewhere classified: Secondary | ICD-10-CM

## 2023-01-01 DIAGNOSIS — Z5111 Encounter for antineoplastic chemotherapy: Secondary | ICD-10-CM | POA: Diagnosis not present

## 2023-01-01 DIAGNOSIS — E871 Hypo-osmolality and hyponatremia: Secondary | ICD-10-CM | POA: Diagnosis not present

## 2023-01-01 LAB — CBC WITH DIFFERENTIAL (CANCER CENTER ONLY)
Abs Immature Granulocytes: 0.04 10*3/uL (ref 0.00–0.07)
Basophils Absolute: 0 10*3/uL (ref 0.0–0.1)
Basophils Relative: 0 %
Eosinophils Absolute: 0.1 10*3/uL (ref 0.0–0.5)
Eosinophils Relative: 1 %
HCT: 42.9 % (ref 36.0–46.0)
Hemoglobin: 14.4 g/dL (ref 12.0–15.0)
Immature Granulocytes: 0 %
Lymphocytes Relative: 7 %
Lymphs Abs: 0.9 10*3/uL (ref 0.7–4.0)
MCH: 31.9 pg (ref 26.0–34.0)
MCHC: 33.6 g/dL (ref 30.0–36.0)
MCV: 95.1 fL (ref 80.0–100.0)
Monocytes Absolute: 0.4 10*3/uL (ref 0.1–1.0)
Monocytes Relative: 4 %
Neutro Abs: 10.5 10*3/uL — ABNORMAL HIGH (ref 1.7–7.7)
Neutrophils Relative %: 88 %
Platelet Count: 254 10*3/uL (ref 150–400)
RBC: 4.51 MIL/uL (ref 3.87–5.11)
RDW: 11.9 % (ref 11.5–15.5)
WBC Count: 11.9 10*3/uL — ABNORMAL HIGH (ref 4.0–10.5)
nRBC: 0 % (ref 0.0–0.2)

## 2023-01-01 LAB — CMP (CANCER CENTER ONLY)
ALT: 12 U/L (ref 0–44)
AST: 15 U/L (ref 15–41)
Albumin: 4 g/dL (ref 3.5–5.0)
Alkaline Phosphatase: 71 U/L (ref 38–126)
Anion gap: 6 (ref 5–15)
BUN: 17 mg/dL (ref 8–23)
CO2: 24 mmol/L (ref 22–32)
Calcium: 9.2 mg/dL (ref 8.9–10.3)
Chloride: 105 mmol/L (ref 98–111)
Creatinine: 0.92 mg/dL (ref 0.44–1.00)
GFR, Estimated: >60 mL/min (ref >60)
Glucose, Bld: 109 mg/dL — ABNORMAL HIGH (ref 70–99)
Potassium: 4.4 mmol/L (ref 3.5–5.1)
Sodium: 135 mmol/L (ref 135–145)
Total Bilirubin: 0.5 mg/dL (ref 0.3–1.2)
Total Protein: 6.9 g/dL (ref 6.5–8.1)

## 2023-01-01 MED ORDER — FULVESTRANT 250 MG/5ML IM SOSY
500.0000 mg | PREFILLED_SYRINGE | INTRAMUSCULAR | Status: DC
  Administered 2023-01-01: 250 mg via INTRAMUSCULAR
  Filled 2023-01-01: qty 10

## 2023-01-01 MED ORDER — ALPRAZOLAM 0.25 MG PO TABS
0.2500 mg | ORAL_TABLET | Freq: Two times a day (BID) | ORAL | 0 refills | Status: DC | PRN
Start: 2023-01-01 — End: 2023-03-12

## 2023-01-01 NOTE — Assessment & Plan Note (Signed)
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 Donell Beers. Oncotype RS of 4 (low risk). She received adjuvant radiation. She attempted letrozole previously but did not tolerate due to colitis (pt has Crohn's). -she developed a rash in early 2022. Excisional biopsy by Dr. Arita Miss on 07/25/20 confirmed invasive carcinoma, felt to represent recurrence of breast cancer. She declined further work up with [place] PET scan and was subsequently lost to f/u. -she developed liver and bone mets in 03/2021.  -Due to her colitis, she may not tolerate CDK4/6 inhibitor well (due to potential diarrhea). After a lengthy discussion, she agreed to fulvestrant injections. She received her first dose on 05/15/21. She has tolerated well. -baseline CA 27.29 06/14/21 was elevated at 375.9. This has been steadily decreasing on treatment -restaging CT CAP 02/25/22 showed good treatment response: improvement to liver lesions and bilateral pleural effusions, resolution of LLQ nodule, stable osseous metastatic lesions. No evidence of new metastatic disease. -She is clinically stable, does have fatigue and manageable diarrhea -Restaging bone scan from August 11, 2022 showed mild uptake in her ribs, indeterminate.  CT scan from 07/2022 showed stable disease

## 2023-01-01 NOTE — Progress Notes (Signed)
Was made aware that Faslodex dose was changed by patient prior to given injection. Notes read verifying dosage change by Dr., Mosetta Putt today. Patient given 250 mg in left buttock.

## 2023-01-01 NOTE — Assessment & Plan Note (Signed)
-  she is on 20 mg generic Prozac. She does not feel this is helping for her new anxiety surrounding her cancer. -I prescribed Xanax on 05/30/21 for her to use as needed. She is no longer taking daily. -she previously declined referral to counseling. -Continue Prozac

## 2023-01-01 NOTE — Assessment & Plan Note (Signed)
-  Follow-up with Dr. Hilarie Fredrickson -Due to her worsening diarrhea recently, she was seen by Dr. Hilarie Fredrickson and his PA on 06/12/22

## 2023-01-01 NOTE — Progress Notes (Signed)
Per Dr. Mosetta Putt, "OK to give only 1 shot of Fulvestrant (Faslodex) in the lft buttocks; therefore, pt will get a half dose of Fulvestrant."

## 2023-01-01 NOTE — Progress Notes (Signed)
[facility]   [phone] Fax:([phone]   Clinic Follow up Note   Patient Care Team: [name], [name], DO as PCP - General [name], MD as Consulting Physician (Breast Surgery) Pyrtle, Carie Caddy, MD as Consulting Physician (Gastroenterology) [name], MD as Referring Physician (Dermatology) Allena Napoleon, MD as Consulting Physician (Plastic Surgery) Malachy Mood, MD as Consulting Physician (Hematology)  Date of Service:  01/01/2023  CHIEF COMPLAINT: f/u of metastatic breast cancer     CURRENT THERAPY:   Fulvestrant injections, starting 05/15/21, now q4weeks    ASSESSMENT:  Sara Barnes is a 82 y.o. 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 Donell Beers. Oncotype RS of 4 (low risk). She received adjuvant radiation. She attempted letrozole previously but did not tolerate due to colitis (pt has Crohn's). -she developed a rash in early 2022. Excisional biopsy by Dr. Arita Miss on 07/25/20 confirmed invasive carcinoma, felt to represent recurrence of breast cancer. She declined further work up with [place] PET scan and was subsequently lost to f/u. -she developed liver and bone mets in 03/2021.  -Due to her colitis, she may not tolerate CDK4/6 inhibitor well (due to potential diarrhea). After a lengthy discussion, she agreed to fulvestrant injections. She received her first dose on 05/15/21. She has tolerated well. -baseline CA 27.29 06/14/21 was elevated at 375.9. This has been steadily decreasing on treatment -Restaging bone scan from August 11, 2022 showed mild uptake in her ribs, indeterminate.  CT scan 08/14/2022 showed stable/slight decreased liver lesions, mixed lytic and sclerotic bone lesion in L4 vertebral body, overall stable.  No other new lesions.    -Patient reports injection related pain, so she requested her dose to reduce to half in May 2024 and again today -will repeat CT scan and a bone scan before next visit in a months     Anxiety and depression -she is on 20 mg generic Prozac. She does not feel this is helping for her new anxiety surrounding her cancer. -I prescribed Xanax on 05/30/21 for her to use as needed. She is no longer taking daily. -she previously declined referral to counseling. -Continue Prozac   Crohn disease (HCC) -Follow-up with Dr. Rhea Belton -Due to her worsening diarrhea recently, she was seen by Dr. Rhea Belton and his PA on 06/12/22  Hyponatremia -Chronic, worse today, sodium 129, she is asymptomatic. -Likely multifactorial, related to her Prozac, excessive water intake etc. I encouraged her to drink water with electrolytes, such as liquid IV, and add additional salt to her diet. -Will monitor   PLAN: -lab reviewed   -Will proceed for her strength at half dose (one injection on left side) due to significant right side pain from previous injection -Lab, follow-up and injection in a months with restaging CT and bone scan a week before   SUMMARY OF ONCOLOGIC HISTORY: Oncology History Overview Note  Cancer of lower-inner quadrant of left female breast [facility])   Staging form: Breast, AJCC 7th Edition     Clinical stage from 12/06/2014: Stage IA (T1c, N0, M0) - Unsigned     Pathologic stage from 12/13/2014: Stage IA (T1c, N0, cM0) - Unsigned      Cancer of lower-inner quadrant of left female breast (HCC)  11/28/2014 Breast US   Highly suspicious 1.6 cm irregular shadowing mass  at the site of palpable concern in the subareolar lower inner quadrant of the left breast at approximately 630.   11/29/2014 Receptors her2   Estrogen Receptor: 100%, POSITIVE, STRONG STAINING INTENSITY (PERFORMED MANUALLY) Progesterone Receptor: 95%, POSITIVE, STRONG STAINING INTENSITY (PERFORMED MANUALLY) Proliferation Marker Ki67: 10%  (PERFORMED MANUALLY), Her2neu negative   11/29/2014 Initial Biopsy   Breast, left, needle core biopsy, subareolar about 6:30 - INVASIVE DUCTAL CARCINOMA, SEE COMMENT.   11/30/2014 Initial Diagnosis   Cancer of lower-inner quadrant of left female breast   12/13/2014 Pathology Results   Left breast lumpectomy showed a 1.7 cm invasive ductal carcinoma, grade 1, margins were negative, (+) LVI, 5 Sentinel lymph nodes were negative,   12/13/2014 Oncotype testing   RS 4, which predicts 10-year risk of distant recurrence 4% with tamoxifen alone.   02/05/2015 - 02/26/2015 Radiation Therapy   left breast radiaiton    03/18/2015 -  Anti-estrogen oral therapy   She tried letrozole for 2-3 weeks, could not tolerate and stopped.   07/25/2020 Pathology Results   FINAL MICROSCOPIC DIAGNOSIS: A. SKIN, LEFT CHEST, EXCISION: - Invasive carcinoma involving dermis and subcutaneous soft tissue, consistent with patient's clinical history of invasive ductal carcinoma, see comment - Deep and lateral resection margins are negative for carcinoma  Appears grade I  ADDENDUM: PROGNOSTIC INDICATOR RESULTS: Immunohistochemical and morphometric analysis performed manually The tumor cells are NEGATIVE for Her2 (1+). Estrogen Receptor: POSITIVE, 90%, STRONG STAINING Progesterone Receptor: POSITIVE, 95%, STRONG STAINING Proliferation Marker Ki-67: 5%    04/19/2021 Imaging   EXAM: CT ABDOMEN AND PELVIS WITH CONTRAST  IMPRESSION: 1. Multiple hypodense hepatic masses, as detailed above, highly suspicious for metastatic disease. Differential include infectious/inflammatory process. Further evaluation with MRI examination with contrast would be helpful. 2. Moderate left and small right pleural effusion. 3. Postsurgical changes for prior hemicolectomy. No bowel obstruction, colitis or diverticulitis.   04/24/2021 Pathology Results   FINAL MICROSCOPIC DIAGNOSIS:   A. LIVER, BIOPSY:  -  Metastatic carcinoma  -   See comment   COMMENT:  By immunohistochemistry, the neoplastic cells are positive for  cytokeratin 7, ER (100%, strong), GATA3 and PAX8 (patchy weak) but negative for cytokeratin 20 and TTF-1.  Overall, the morphology and immunophenotype supports metastasis from the patient's known mammary carcinoma.   ADDENDUM:  By immunohistochemistry, HER-2 is EQUIVOCAL (2+).    ADDENDUM:  FLOURESCENCE IN-SITU HYBRIDIZATION RESULTS:  GROUP 5:   HER2 **NEGATIVE**    05/11/2021 Imaging   EXAM: CT CHEST WITH CONTRAST  IMPRESSION: 1. Bilateral pleural effusions and lower lobe compressive atelectasis, left greater than right. 2. Nonspecific right hilar lymphadenopathy. 3. Bony lytic lesions involving the left posterior fifth rib, left T5 transverse process, and left aspect of the T10 vertebral body, consistent with bony metastases. 4. Patchy right upper lobe ground-glass airspace disease, which may be inflammatory or infectious. 5. Slight decrease in size of the left lobe subcapsular liver hematoma. No evidence of active hemorrhage. 6. Stable liver hypodensities consistent with presumed metastatic disease. Please correlate with previous biopsy results.   05/13/2021 Pathology Results   Specimen Submitted:  A. PLEURAL FLUID, LEFT, THORACENTESIS:   FINAL MICROSCOPIC DIAGNOSIS:  - Atypical mesothelial cells present  - Chronic inflammation       INTERVAL HISTORY:  Sara Barnes is here for a follow up of metastatic breast cancer . She was last seen by me on 11/07/2022. She is here with her sister. She had severe pain at right site injection site last time and she  does not want the injection on right side today She also complains of worsening neck and upper chest pain, although it has been chronic.  She overall had a hard time last month.  Her weight is stable, no weight loss.  All other systems were reviewed with the patient and are negative.  MEDICAL HISTORY:  Past Medical History:  Diagnosis  Date   Allergy    Anemia    Anxiety    Arthritis    Basal cell carcinoma    Blood transfusion without reported diagnosis    Bowel perforation (HCC)    Cancer of lower-inner quadrant of female breast (HCC) 11/30/2014   left   Cancer of lower-inner quadrant of left female breast (HCC) 11/30/2014   Clostridium difficile infection    Complication of anesthesia    anxiety post-op after ear surgeries   Crohn's disease of large intestine with abscess (HCC)    Dental crowns present    recent root canal 11/2014   Depression    Family history of adverse reaction to anesthesia    pt's sister has hx. 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   Seasonal allergies    TMJ (temporomandibular joint syndrome)    Ulcerative colitis    Vitamin B12 deficiency     SURGICAL HISTORY: Past Surgical History:  Procedure Laterality Date   ABDOMINAL HYSTERECTOMY     partial   APPENDECTOMY     AUGMENTATION MAMMAPLASTY Bilateral    BASAL CELL CARCINOMA EXCISION Left    nose   BREAST BIOPSY Left 2019   benign   BREAST ENHANCEMENT SURGERY     BREAST LUMPECTOMY Left 2016   BREAST LUMPECTOMY WITH RADIOACTIVE SEED AND SENTINEL LYMPH NODE BIOPSY Left 12/13/2014   Procedure: LEFT BREAST LUMPECTOMY WITH RADIOACTIVE SEED AND SENTINEL LYMPH NODE BIOPSY;  Surgeon: [name], MD;  Location:  SURGERY CENTER;  Service: General;  Laterality: Left;   COLONOSCOPY WITH PROPOFOL  05/28/2011; 06/28/2014   EXPLORATORY LAPAROTOMY  04/08/2018   with end ileostomy takedown and creation of loop ileostomy-- [facility]-Dr Inetta Fermo   FLEXIBLE SIGMOIDOSCOPY N/A 01/08/2016   Procedure: Arnell Sieving;  Surgeon: Ruffin Frederick, MD;  Location: [facility];  Service: Gastroenterology;  Laterality: N/A;  needs MAC due to pain and anxiety   ILEOSTOMY N/A 05/25/2017   Procedure: DILATION OF ILEOSTOMY;  Surgeon: Emelia Loron, MD;  Location: [facility];  Service: General;  Laterality: N/A;   ILEOSTOMY  02/11/2016   END ileostomy.  Dr Daphine Deutscher   ILEOSTOMY CLOSURE N/A 06/02/2017   Procedure: RESECTION OF ILEAL STRICTURE; ILEOSTOMY REVISION ;  Surgeon: Karie Soda, MD;  Location: [facility];  Service: General;  Laterality: N/A;   ILEOSTOMY DILATION  05/25/2017   DILITATION OF ILEAL STRICTURE   ileostomy stricture resection  06/02/2017   with revision   INCISION AND DRAINAGE ABSCESS  2018   INCISION AND DRAINAGE PERIRECTAL ABSCESS N/A 09/28/2013   Procedure: IRRIGATION AND DEBRIDEMENT PERIANAL ABSCESS, proctoscopy;  Surgeon: Kandis Cocking, MD;  Location: [facility];  Service: General;  Laterality: N/A;   INCISION AND DRAINAGE PERIRECTAL ABSCESS  N/A 01/22/2016   Procedure: IRRIGATION AND DEBRIDEMENT PERIRECTAL ABSCESS;  Surgeon: Darnell Level, MD;  Location: [facility];  Service: General;  Laterality: N/A;   INNER EAR SURGERY Bilateral    LAPAROSCOPIC DIVERTED COLOSTOMY N/A 02/11/2016   Procedure: LAPAROSCOPIC DIVERTED ILEOSTOMY;  Surgeon: Luretha Murphy, MD;  Location: [facility];  Service: General;  Laterality: N/A;   LAPAROSCOPY N/A 03/23/2017   Procedure: LAPAROSCOPY DIAGNOSTIC, EXPLORATORY LAPAROTOMY,  BOWEL RESECTION;  Surgeon: Avel Peace, MD;  Location: [facility];  Service: General;  Laterality: N/A;    I have reviewed the social history and family history with the patient and they are unchanged from previous note.  ALLERGIES:  is allergic to nsaids, codeine, mesalamine, oxycodone, lactose intolerance (gi), norco [hydrocodone-acetaminophen], other, and sulfa antibiotics.  MEDICATIONS:  Current Outpatient Medications  Medication Sig Dispense Refill   acetaminophen (TYLENOL) 500 MG  tablet You can take 2 tablets every 6 hours as needed for pain.  Use this as your primary pain control.  Use Tramadol as a last resort.  You can buy this over the counter at any drug store.    DO NOT TAKE MORE THAN 4000 MG OF TYLENOL PER DAY.  IT CAN HARM YOUR LIVER. 30 tablet 0   ALPRAZolam (XANAX) 0.25 MG tablet Take 1 tablet (0.25 mg total) by mouth 2 (two) times daily as needed for anxiety. 60 tablet 0   amLODipine (NORVASC) 10 MG tablet Take by mouth.     cephALEXin (KEFLEX) 500 MG capsule Take 1 capsule (500 mg total) by mouth 2 (two) times daily. 14 capsule 0   cyanocobalamin (VITAMIN B12) 1000 MCG/ML injection INJECT INTO THE MUSCLE ONCE PER MONTH 3 mL 11   diphenoxylate-atropine (LOMOTIL) 2.5-0.025 MG tablet Take 1-2 tablets by mouth 3-4 times daily AS NEEDED for severe diarrhea 240 tablet 6   famotidine (PEPCID) 20 MG tablet Take 1 tablet (20 mg total) by mouth 2 (two) times daily. (Patient taking differently: Take 20 mg by mouth daily as needed for indigestion.) 180 tablet 6   FLUoxetine (PROZAC) 20 MG capsule TAKE 1 CAPSULE(20 MG) BY MOUTH DAILY 90 capsule 1   fluticasone (FLONASE) 50 MCG/ACT nasal spray Place 2 sprays into both nostrils daily. (Patient taking differently: Place 2 sprays into both nostrils daily as needed for rhinitis.) 16 g 6   loperamide (IMODIUM) 2 MG capsule Take 1 capsule (2 mg total) by mouth as needed for diarrhea or loose stools. 90 capsule 1   ondansetron (ZOFRAN-ODT) 4 MG disintegrating tablet DISSOLVE ONE TABLET BY MOUTH EVERY 6 HOURS AS NEEDED FOR NAUSEA AND VOMITING 30 tablet 3   predniSONE (DELTASONE) 5 MG tablet 1 po qd 90 tablet 3   white petrolatum ointment Apply topically.     zolpidem (AMBIEN) 10 MG tablet TAKE 1 TABLET(10 MG) BY MOUTH AT BEDTIME AS NEEDED FOR SLEEP 30 tablet 1   No current facility-administered medications for this visit.    PHYSICAL EXAMINATION: ECOG PERFORMANCE STATUS: 2 - Symptomatic, <50% confined to bed  Vitals:    01/01/23 1322  BP: 136/81  Pulse: 72  Resp: 18  Temp: 98 F (36.7 C)  SpO2: 99%   Wt Readings from Last 3 Encounters:  01/01/23 157 lb 9.6 oz (71.5 kg)  11/07/22 157 lb 4.8 oz (71.4 kg)  10/10/22 156 lb (70.8 kg)     GENERAL:alert, no distress and comfortable SKIN: skin color normal, no rashes or significant lesions EYES: normal, Conjunctiva are pink and non-injected, sclera clear  NEURO:  alert & oriented x 3 with fluent speech  LABORATORY DATA:  I have reviewed the data as listed    Latest Ref Rng & Units 01/01/2023   12:21 PM 11/07/2022   12:50 PM 10/10/2022   12:40 PM  CBC  WBC 4.0 - 10.5 K/uL 11.9  9.9  13.1   Hemoglobin 12.0 - 15.0 g/dL 16.1  09.6  04.5   Hematocrit 36.0 - 46.0 % 42.9  42.4  41.4   Platelets 150 - 400 K/uL 254  228  276         Latest Ref Rng & Units 01/01/2023   12:21 PM 11/07/2022   12:50 PM 10/10/2022   12:40 PM  CMP  Glucose 70 - 99 mg/dL 409  93  811   BUN 8 - 23 mg/dL 17  16  13    Creatinine 0.44 - 1.00 mg/dL 9.14  7.82  9.56   Sodium 135 - 145 mmol/L 135  129  137   Potassium 3.5 - 5.1 mmol/L 4.4  4.5  4.8   Chloride 98 - 111 mmol/L 105  99  104   CO2 22 - 32 mmol/L 24  24  25    Calcium 8.9 - 10.3 mg/dL 9.2  9.1  9.5   Total Protein 6.5 - 8.1 g/dL 6.9  6.7  6.7   Total Bilirubin 0.3 - 1.2 mg/dL 0.5  0.5  0.5   Alkaline Phos 38 - 126 U/L 71  75  82   AST 15 - 41 U/L 15  13  13    ALT 0 - 44 U/L 12  8  10        RADIOGRAPHIC STUDIES: I have personally reviewed the radiological images as listed and agreed with the findings in the report. No results found.    Orders Placed This Encounter  Procedures   CT CHEST ABDOMEN PELVIS W CONTRAST    Standing Status:   Future    Standing Expiration Date:   01/01/2024    Order Specific Question:   If indicated for the ordered procedure, I authorize the administration of contrast media per Radiology protocol    Answer:   Yes    Order Specific Question:   Does the patient have a contrast media/X-ray  dye allergy?    Answer:   Yes    Order Specific Question:   Preferred imaging location?    Answer:   [facility]    Order Specific Question:   If indicated for the ordered procedure, I authorize the administration of oral contrast media per Radiology protocol    Answer:   Yes   NM Bone Scan Whole Body    Standing Status:   Future    Standing Expiration Date:   01/01/2024    Order Specific Question:   If indicated for the ordered procedure, I authorize the administration of a radiopharmaceutical per Radiology protocol    Answer:   Yes    Order Specific Question:   Preferred imaging location?    Answer:   [facility]   All questions were answered. The patient knows to call the clinic with any problems, questions or concerns. No barriers to learning was detected. The total time spent in the appointment was 30 minutes.     Malachy Mood, MD 01/01/2023

## 2023-01-02 ENCOUNTER — Telehealth: Payer: Self-pay | Admitting: Hematology

## 2023-01-02 LAB — CANCER ANTIGEN 27.29: CA 27.29: 25.9 U/mL (ref 0.0–38.6)

## 2023-01-23 ENCOUNTER — Ambulatory Visit (HOSPITAL_COMMUNITY)
Admission: RE | Admit: 2023-01-23 | Discharge: 2023-01-23 | Disposition: A | Discharge status: Home / Self Care | Payer: Medicare HMO | Source: Ambulatory Visit | Attending: Hematology | Admitting: Hematology

## 2023-01-23 ENCOUNTER — Inpatient Hospital Stay: Payer: Medicare HMO

## 2023-01-23 ENCOUNTER — Encounter (HOSPITAL_COMMUNITY)
Admission: RE | Admit: 2023-01-23 | Discharge: 2023-01-23 | Disposition: A | Discharge status: Home / Self Care | Payer: Medicare HMO | Source: Ambulatory Visit | Attending: Hematology

## 2023-01-23 ENCOUNTER — Encounter (HOSPITAL_COMMUNITY)
Admission: RE | Admit: 2023-01-23 | Discharge: 2023-01-23 | Disposition: A | Discharge status: Home / Self Care | Payer: Medicare HMO | Source: Ambulatory Visit | Attending: Hematology | Admitting: Hematology

## 2023-01-23 DIAGNOSIS — J90 Pleural effusion, not elsewhere classified: Secondary | ICD-10-CM | POA: Diagnosis not present

## 2023-01-23 DIAGNOSIS — C7951 Secondary malignant neoplasm of bone: Secondary | ICD-10-CM | POA: Diagnosis not present

## 2023-01-23 DIAGNOSIS — I700 Atherosclerosis of aorta: Secondary | ICD-10-CM | POA: Diagnosis not present

## 2023-01-23 DIAGNOSIS — Z170 Estrogen receptor positive status [ER+]: Secondary | ICD-10-CM | POA: Insufficient documentation

## 2023-01-23 DIAGNOSIS — R160 Hepatomegaly, not elsewhere classified: Secondary | ICD-10-CM

## 2023-01-23 DIAGNOSIS — C50312 Malignant neoplasm of lower-inner quadrant of left female breast: Secondary | ICD-10-CM | POA: Insufficient documentation

## 2023-01-23 DIAGNOSIS — C50919 Malignant neoplasm of unspecified site of unspecified female breast: Secondary | ICD-10-CM

## 2023-01-23 DIAGNOSIS — Z853 Personal history of malignant neoplasm of breast: Secondary | ICD-10-CM | POA: Diagnosis not present

## 2023-01-23 LAB — CBC WITH DIFFERENTIAL (CANCER CENTER ONLY)
Abs Immature Granulocytes: 0.03 10*3/uL (ref 0.00–0.07)
Basophils Absolute: 0.1 10*3/uL (ref 0.0–0.1)
Basophils Relative: 1 %
Eosinophils Absolute: 0.1 10*3/uL (ref 0.0–0.5)
Eosinophils Relative: 1 %
HCT: 41 % (ref 36.0–46.0)
Hemoglobin: 14 g/dL (ref 12.0–15.0)
Immature Granulocytes: 0 %
Lymphocytes Relative: 25 %
Lymphs Abs: 1.9 10*3/uL (ref 0.7–4.0)
MCH: 32.2 pg (ref 26.0–34.0)
MCHC: 34.1 g/dL (ref 30.0–36.0)
MCV: 94.3 fL (ref 80.0–100.0)
Monocytes Absolute: 0.6 10*3/uL (ref 0.1–1.0)
Monocytes Relative: 8 %
Neutro Abs: 5 10*3/uL (ref 1.7–7.7)
Neutrophils Relative %: 65 %
Platelet Count: 210 10*3/uL (ref 150–400)
RBC: 4.35 MIL/uL (ref 3.87–5.11)
RDW: 11.9 % (ref 11.5–15.5)
WBC Count: 7.6 10*3/uL (ref 4.0–10.5)
nRBC: 0 % (ref 0.0–0.2)

## 2023-01-23 LAB — CMP (CANCER CENTER ONLY)
ALT: 9 U/L (ref 0–44)
AST: 15 U/L (ref 15–41)
Albumin: 3.6 g/dL (ref 3.5–5.0)
Alkaline Phosphatase: 68 U/L (ref 38–126)
Anion gap: 6 (ref 5–15)
BUN: 11 mg/dL (ref 8–23)
CO2: 26 mmol/L (ref 22–32)
Calcium: 8.6 mg/dL — ABNORMAL LOW (ref 8.9–10.3)
Chloride: 100 mmol/L (ref 98–111)
Creatinine: 0.93 mg/dL (ref 0.44–1.00)
GFR, Estimated: >60 mL/min (ref >60)
Glucose, Bld: 91 mg/dL (ref 70–99)
Potassium: 3.7 mmol/L (ref 3.5–5.1)
Sodium: 132 mmol/L — ABNORMAL LOW (ref 135–145)
Total Bilirubin: 0.5 mg/dL (ref 0.3–1.2)
Total Protein: 6.4 g/dL — ABNORMAL LOW (ref 6.5–8.1)

## 2023-01-23 MED ORDER — DIPHENHYDRAMINE HCL 50 MG/ML IJ SOLN
INTRAMUSCULAR | Status: AC
  Filled 2023-01-23: qty 1

## 2023-01-23 MED ORDER — DIPHENHYDRAMINE HCL 50 MG/ML IJ SOLN
25.0000 mg | Freq: Once | INTRAMUSCULAR | Status: AC
  Administered 2023-01-23: 25 mg via INTRAVENOUS

## 2023-01-23 MED ORDER — SODIUM CHLORIDE (PF) 0.9 % IJ SOLN
INTRAMUSCULAR | Status: AC
  Filled 2023-01-23: qty 50

## 2023-01-23 MED ORDER — TECHNETIUM TC 99M MEDRONATE IV KIT
20.0000 | PACK | Freq: Once | INTRAVENOUS | Status: AC
  Administered 2023-01-23: 20 via INTRAVENOUS

## 2023-01-23 MED ORDER — IOHEXOL 300 MG/ML  SOLN
100.0000 mL | Freq: Once | INTRAMUSCULAR | Status: AC | PRN
  Administered 2023-01-23: 100 mL via INTRAVENOUS

## 2023-01-23 NOTE — Progress Notes (Signed)
Patient ID: Sara Barnes, female   DOB: 07-13-1940, 82 y.o.   MRN: [id number] Patient presented to [facility] radiology department today for CT chest abdomen pelvis with contrast.  Following injection of contrast patient began to experience skin flushing and diffuse itching.  No known history of prior contrast allergy.  No edema/hives.  She was anxious.  Vital signs were obtained and were stable.  Patient was given Benadryl 25 mg IV.  Both skin flushing and itching gradually resolved.  Patient will be observed in the radiology department in the interim prior to her scheduled bone scan at 1:00 PM today.  Patient's chart will be flagged for contrast allergy.

## 2023-02-01 NOTE — Progress Notes (Unsigned)
Patient Care Team: [name], [name], DO as PCP - General [name], MD as Consulting Physician (Breast Surgery) Pyrtle, Carie Caddy, MD as Consulting Physician (Gastroenterology) [name], MD as Referring Physician (Dermatology) Allena Napoleon, MD as Consulting Physician (Plastic Surgery) Malachy Mood, MD as Consulting Physician (Hematology)   CHIEF COMPLAINT: Follow up metastatic breast cancer   Oncology History Overview Note  Cancer of lower-inner quadrant of left female breast [facility])   Staging form: Breast, AJCC 7th Edition     Clinical stage from 12/06/2014: Stage IA (T1c, N0, M0) - Unsigned     Pathologic stage from 12/13/2014: Stage IA (T1c, N0, cM0) - Unsigned      Cancer of lower-inner quadrant of left female breast (HCC)  11/28/2014 Breast US   Highly suspicious 1.6 cm irregular shadowing mass at the site of palpable concern in the subareolar lower inner quadrant of the left breast at approximately 630.   11/29/2014 Receptors her2   Estrogen Receptor: 100%, POSITIVE, STRONG STAINING INTENSITY (PERFORMED MANUALLY) Progesterone Receptor: 95%, POSITIVE, STRONG STAINING INTENSITY (PERFORMED MANUALLY) Proliferation Marker Ki67: 10% (PERFORMED MANUALLY), Her2neu negative   11/29/2014 Initial Biopsy   Breast, left, needle core biopsy, subareolar about 6:30 - INVASIVE DUCTAL CARCINOMA, SEE COMMENT.   11/30/2014 Initial Diagnosis   Cancer of lower-inner quadrant of left female breast   12/13/2014 Pathology Results   Left breast lumpectomy showed a 1.7 cm invasive ductal carcinoma, grade 1, margins were negative, (+) LVI, 5 Sentinel lymph nodes were negative,   12/13/2014 Oncotype testing   RS 4, which predicts 10-year risk of distant recurrence 4% with tamoxifen alone.   02/05/2015 - 02/26/2015 Radiation Therapy   left breast radiaiton    03/18/2015 -  Anti-estrogen oral therapy   She tried letrozole for 2-3 weeks, could not tolerate and stopped.   07/25/2020 Pathology  Results   FINAL MICROSCOPIC DIAGNOSIS: A. SKIN, LEFT CHEST, EXCISION: - Invasive carcinoma involving dermis and subcutaneous soft tissue, consistent with patient's clinical history of invasive ductal carcinoma, see comment - Deep and lateral resection margins are negative for carcinoma  Appears grade I  ADDENDUM: PROGNOSTIC INDICATOR RESULTS: Immunohistochemical and morphometric analysis performed manually The tumor cells are NEGATIVE for Her2 (1+). Estrogen Receptor: POSITIVE, 90%, STRONG STAINING Progesterone Receptor: POSITIVE, 95%, STRONG STAINING Proliferation Marker Ki-67: 5%    04/19/2021 Imaging   EXAM: CT ABDOMEN AND PELVIS WITH CONTRAST  IMPRESSION: 1. Multiple hypodense hepatic masses, as detailed above, highly suspicious for metastatic disease. Differential include infectious/inflammatory process. Further evaluation with MRI examination with contrast would be helpful. 2. Moderate left and small right pleural effusion. 3. Postsurgical changes for prior hemicolectomy. No bowel obstruction, colitis or diverticulitis.   04/24/2021 Pathology Results   FINAL MICROSCOPIC DIAGNOSIS:   A. LIVER, BIOPSY:  -  Metastatic carcinoma  -  See comment   COMMENT:  By immunohistochemistry, the neoplastic cells are positive for  cytokeratin 7, ER (100%, strong), GATA3 and PAX8 (patchy weak) but negative for cytokeratin 20 and TTF-1.  Overall, the morphology and immunophenotype supports metastasis from the patient's known mammary carcinoma.   ADDENDUM:  By immunohistochemistry, HER-2 is EQUIVOCAL (2+).    ADDENDUM:  FLOURESCENCE IN-SITU HYBRIDIZATION RESULTS:  GROUP 5:   HER2 **NEGATIVE**    05/11/2021 Imaging   EXAM: CT CHEST WITH CONTRAST  IMPRESSION: 1. Bilateral pleural effusions and lower lobe compressive atelectasis, left greater than right. 2. Nonspecific right hilar lymphadenopathy. 3. Bony lytic lesions involving the left posterior fifth rib,  left T5 transverse  process, and left aspect of the T10 vertebral body, consistent with bony metastases. 4. Patchy right upper lobe ground-glass airspace disease, which may be inflammatory or infectious. 5. Slight decrease in size of the left lobe subcapsular liver hematoma. No evidence of active hemorrhage. 6. Stable liver hypodensities consistent with presumed metastatic disease. Please correlate with previous biopsy results.   05/13/2021 Pathology Results   Specimen Submitted:  A. PLEURAL FLUID, LEFT, THORACENTESIS:   FINAL MICROSCOPIC DIAGNOSIS:  - Atypical mesothelial cells present  - Chronic inflammation    01/23/2023 Imaging   CT chest, abdomen, and pelvis with contrast  IMPRESSION: 1. Stable hepatic and osseous metastatic disease. No evidence of disease progression. 2. Small left pleural effusion, slightly increased. 3.  Aortic atherosclerosis (ICD10-I70.0).      CURRENT THERAPY: Fulvestrant, starting 05/15/21, now q4 weeks  INTERVAL HISTORY [name] returns for follow up as scheduled. Last seen by Dr. Mosetta Putt 01/01/23, she continues injections.  Tolerating mostly well overall with fatigue, joint aches, dry skin, and hair loss.  Takes a while to recover from the injection site pain and wonders if she could get this every 6 or 8 weeks.  She remains functional and active, up and down with breaks.  Frequent bowel movements from Crohn's at baseline.  She had a reaction to CT contrast, she developed high blood pressure, itching, and redness all over without hives.  She received Benadryl and symptoms resolved.  She has never had an issue before.  ROS  All other systems reviewed and negative  Past Medical History:  Diagnosis Date   Allergy    Anemia    Anxiety    Arthritis    Basal cell carcinoma    Blood transfusion without reported diagnosis    Bowel perforation (HCC)    Cancer of lower-inner quadrant of female breast (HCC) 11/30/2014   left   Cancer of lower-inner quadrant of left female breast  (HCC) 11/30/2014   Clostridium difficile infection    Complication of anesthesia    anxiety post-op after ear surgeries   Crohn's disease of large intestine with abscess (HCC)    Dental crowns present    recent root canal 11/2014   Depression    Family history of adverse reaction to anesthesia    pt's sister has hx. 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   Seasonal allergies    TMJ (temporomandibular joint syndrome)    Ulcerative colitis    Vitamin B12 deficiency      Past Surgical History:  Procedure Laterality Date   ABDOMINAL HYSTERECTOMY     partial   APPENDECTOMY     AUGMENTATION MAMMAPLASTY Bilateral    BASAL CELL CARCINOMA EXCISION Left    nose   BREAST BIOPSY Left 2019   benign   BREAST ENHANCEMENT SURGERY     BREAST LUMPECTOMY Left 2016   BREAST LUMPECTOMY WITH RADIOACTIVE SEED AND SENTINEL LYMPH NODE BIOPSY Left 12/13/2014   Procedure: LEFT BREAST LUMPECTOMY WITH RADIOACTIVE SEED AND SENTINEL LYMPH NODE BIOPSY;  Surgeon: [name], MD;  Location: [facility];  Service: General;  Laterality: Left;  COLONOSCOPY WITH PROPOFOL  05/28/2011; 06/28/2014   EXPLORATORY LAPAROTOMY  04/08/2018   with end ileostomy takedown and creation of loop ileostomy-- [facility]-Dr Inetta Fermo   FLEXIBLE SIGMOIDOSCOPY N/A 01/08/2016   Procedure: Arnell Sieving;  Surgeon: Ruffin Frederick, MD;  Location: [facility];  Service: Gastroenterology;  Laterality: N/A;  needs MAC due to pain and anxiety   ILEOSTOMY N/A 05/25/2017   Procedure: DILATION OF ILEOSTOMY;  Surgeon: Emelia Loron, MD;  Location: [facility];  Service: General;  Laterality:  N/A;   ILEOSTOMY  02/11/2016   END ileostomy.  Dr Daphine Deutscher   ILEOSTOMY CLOSURE N/A 06/02/2017   Procedure: RESECTION OF ILEAL STRICTURE; ILEOSTOMY REVISION ;  Surgeon: Karie Soda, MD;  Location: [facility];  Service: General;  Laterality: N/A;   ILEOSTOMY DILATION  05/25/2017   DILITATION OF ILEAL STRICTURE   ileostomy stricture resection  06/02/2017   with revision   INCISION AND DRAINAGE ABSCESS  2018   INCISION AND DRAINAGE PERIRECTAL ABSCESS N/A 09/28/2013   Procedure: IRRIGATION AND DEBRIDEMENT PERIANAL ABSCESS, proctoscopy;  Surgeon: Kandis Cocking, MD;  Location: [facility];  Service: General;  Laterality: N/A;   INCISION AND DRAINAGE PERIRECTAL ABSCESS N/A 01/22/2016   Procedure: IRRIGATION AND DEBRIDEMENT PERIRECTAL ABSCESS;  Surgeon: Darnell Level, MD;  Location: [facility];  Service: General;  Laterality: N/A;   INNER EAR SURGERY Bilateral    LAPAROSCOPIC DIVERTED COLOSTOMY N/A 02/11/2016   Procedure: LAPAROSCOPIC DIVERTED ILEOSTOMY;  Surgeon: Luretha Murphy, MD;  Location: [facility];  Service: General;  Laterality: N/A;   LAPAROSCOPY N/A 03/23/2017   Procedure: LAPAROSCOPY DIAGNOSTIC, EXPLORATORY LAPAROTOMY,  BOWEL RESECTION;  Surgeon: Avel Peace, MD;  Location: [facility];  Service: General;  Laterality: N/A;     Outpatient Encounter Medications as of 02/03/2023  Medication Sig   acetaminophen (TYLENOL) 500 MG tablet You can take 2 tablets every 6 hours as needed for pain.  Use this as your primary pain control.  Use Tramadol as a last resort.  You can buy this over the counter at any drug store.    DO NOT TAKE MORE THAN 4000 MG OF TYLENOL PER DAY.  IT CAN HARM YOUR LIVER.   ALPRAZolam (XANAX) 0.25 MG tablet Take 1 tablet (0.25 mg total) by mouth 2 (two) times daily as needed for anxiety.   cyanocobalamin (VITAMIN B12) 1000 MCG/ML injection INJECT INTO THE MUSCLE ONCE PER MONTH   diphenoxylate-atropine (LOMOTIL) 2.5-0.025 MG tablet Take 1-2 tablets by mouth 3-4 times daily AS NEEDED for  severe diarrhea   FLUoxetine (PROZAC) 20 MG capsule TAKE 1 CAPSULE(20 MG) BY MOUTH DAILY   fluticasone (FLONASE) 50 MCG/ACT nasal spray Place 2 sprays into both nostrils daily. (Patient taking differently: Place 2 sprays into both nostrils daily as needed for rhinitis.)   loperamide (IMODIUM) 2 MG capsule Take 1 capsule (2 mg total) by mouth as needed for diarrhea or loose stools.   ondansetron (ZOFRAN-ODT) 4 MG disintegrating tablet DISSOLVE ONE TABLET BY MOUTH EVERY 6 HOURS AS NEEDED FOR NAUSEA AND VOMITING   predniSONE (DELTASONE) 5 MG tablet 1 po qd   [DISCONTINUED] zolpidem (AMBIEN) 10 MG tablet TAKE 1 TABLET(10 MG) BY MOUTH AT BEDTIME AS NEEDED FOR SLEEP   cephALEXin (KEFLEX) 500 MG capsule Take 1 capsule (500 mg total) by mouth 2 (two) times daily. (Patient not taking: Reported on 02/03/2023)   famotidine (PEPCID) 20 MG tablet Take 1 tablet (20 mg total) by mouth 2 (two) times daily. (Patient not taking:  Reported on 02/03/2023)   zolpidem (AMBIEN) 10 MG tablet TAKE 1 TABLET(10 MG) BY MOUTH AT BEDTIME AS NEEDED FOR SLEEP   [DISCONTINUED] amLODipine (NORVASC) 10 MG tablet Take by mouth.   [DISCONTINUED] white petrolatum ointment Apply topically.   No facility-administered encounter medications on file as of 02/03/2023.     Today's Vitals   02/03/23 1049 02/03/23 1100  BP: 129/85   Pulse: 73   Resp: 15   Temp: 98.2 F (36.8 C)   TempSrc: Oral   SpO2: 97%   Weight: 158 lb 4.8 oz (71.8 kg)   Height: 5\' 6"  (1.676 m)   PainSc:  0-No pain   Body mass index is 25.55 kg/m.   PHYSICAL EXAM GENERAL:alert, no distress and comfortable SKIN: no rash  EYES: sclera clear NECK: without mass LYMPH:  no palpable cervical or supraclavicular lymphadenopathy  LUNGS: clear with normal breathing effort HEART: regular rate & rhythm, no lower extremity edema ABDOMEN: abdomen soft, non-tender and normal bowel sounds NEURO: alert & oriented x 3 with fluent speech, no focal motor/sensory  deficits   CBC    Component Value Date/Time   WBC 7.6 01/23/2023 0912   WBC 10.1 05/28/2021 1131   RBC 4.35 01/23/2023 0912   HGB 14.0 01/23/2023 0912   HGB 13.7 12/06/2014 0804   HCT 41.0 01/23/2023 0912   HCT 41.1 12/06/2014 0804   PLT 210 01/23/2023 0912   PLT 303 12/06/2014 0804   MCV 94.3 01/23/2023 0912   MCV 95.8 12/06/2014 0804   MCH 32.2 01/23/2023 0912   MCHC 34.1 01/23/2023 0912   RDW 11.9 01/23/2023 0912   RDW 12.2 12/06/2014 0804   LYMPHSABS 1.9 01/23/2023 0912   LYMPHSABS 1.8 12/06/2014 0804   MONOABS 0.6 01/23/2023 0912   MONOABS 0.8 12/06/2014 0804   EOSABS 0.1 01/23/2023 0912   EOSABS 0.2 12/06/2014 0804   BASOSABS 0.1 01/23/2023 0912   BASOSABS 0.0 12/06/2014 0804     CMP     Component Value Date/Time   NA 132 (L) 01/23/2023 0912   NA 138 12/06/2014 0804   K 3.7 01/23/2023 0912   K 4.1 12/06/2014 0804   CL 100 01/23/2023 0912   CO2 26 01/23/2023 0912   CO2 25 12/06/2014 0804   GLUCOSE 91 01/23/2023 0912   GLUCOSE 92 12/06/2014 0804   BUN 11 01/23/2023 0912   BUN 12.3 12/06/2014 0804   CREATININE 0.93 01/23/2023 0912   CREATININE 0.89 02/09/2020 1129   CREATININE 0.8 12/06/2014 0804   CALCIUM 8.6 (L) 01/23/2023 0912   CALCIUM 9.4 12/06/2014 0804   PROT 6.4 (L) 01/23/2023 0912   PROT 6.5 12/06/2014 0804   ALBUMIN 3.6 01/23/2023 0912   ALBUMIN 3.3 (L) 12/06/2014 0804   AST 15 01/23/2023 0912   AST 9 12/06/2014 0804   ALT 9 01/23/2023 0912   ALT 10 12/06/2014 0804   ALKPHOS 68 01/23/2023 0912   ALKPHOS 89 12/06/2014 0804   BILITOT 0.5 01/23/2023 0912   BILITOT 0.37 12/06/2014 0804   GFRNONAA >60 01/23/2023 0912   GFRAA >60 06/09/2017 0348     ASSESSMENT & PLAN:Sara Barnes is a 82 y.o. 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 Donell Beers.  Oncotype RS of 4 (low risk). She received adjuvant radiation. She attempted letrozole previously but  did not tolerate due to colitis (pt has Crohn's). -she developed a rash in early 2022. Excisional biopsy by Dr. Arita Miss on 07/25/20 confirmed invasive carcinoma, felt to represent recurrence of breast cancer. She declined further work up with [place] PET scan and was subsequently lost to f/u. -she developed liver and bone mets in 03/2021.  -Started Fulvestrant 05/15/21. She has tolerated well. -baseline CA 27.29 06/14/21 was elevated at 375.9. This has been steadily decreasing on treatment -Restaging bone scan from 08/11/22 showed mild uptake in her ribs, indeterminate.  CT scan 08/14/2022 showed stable/slight decreased liver lesions, mixed lytic and sclerotic bone lesion in L4 vertebral body, overall stable.  No other new lesions.   -received 1/2 dose periodically due to injection site pain -This morning appears stable.  She is tolerating fulvestrant with stable fatigue, joint pain, dry skin, and hair thinning.  She remains functional with adequate performance status -I reviewed her restaging CT and bone scan which show stable disease, no progression.  We reviewed the current regimen is adequately controlling her cancer -She had itching and erythema with CT contrast, will add allergy protocol prednisone/benadryl with next scan -The recommendation is to continue monthly fulvestrant.  Okay if she wants to space out more over the holidays for QOL but I recommend adhering to every 4 weeks as closely as possible; she agrees. -Follow-up in 2 months   Anxiety and depression -Prozac and xanax PRN -Refill Ambien   Crohn disease (HCC) -Follow-up with Dr. Rhea Belton       PLAN: -CT and labs reviewed, stable disease -Continue Fulvestrant q4 weeks, proceed today -Flu vac today -Refill Ambien -F/up in 2 months  -Plan reviewed with Dr. Mosetta Putt   All questions were answered. The patient knows to call the clinic  with any problems, questions or concerns. No barriers to learning were detected. I spent 20 minutes counseling the patient face to face. The total time spent in the appointment was 30 minutes and more than 50% was on counseling, review of test results, and coordination of care.   Santiago Glad, NP-C 02/03/2023

## 2023-02-03 ENCOUNTER — Inpatient Hospital Stay: Payer: Medicare HMO | Attending: Hematology | Admitting: Nurse Practitioner

## 2023-02-03 ENCOUNTER — Inpatient Hospital Stay: Payer: Medicare HMO

## 2023-02-03 ENCOUNTER — Encounter: Payer: Self-pay | Admitting: Nurse Practitioner

## 2023-02-03 VITALS — BP 129/85 | HR 73 | Temp 98.2°F | Resp 15 | Ht 66.0 in | Wt 158.3 lb

## 2023-02-03 DIAGNOSIS — Z23 Encounter for immunization: Secondary | ICD-10-CM | POA: Diagnosis not present

## 2023-02-03 DIAGNOSIS — C787 Secondary malignant neoplasm of liver and intrahepatic bile duct: Secondary | ICD-10-CM | POA: Diagnosis not present

## 2023-02-03 DIAGNOSIS — Z1721 Progesterone receptor positive status: Secondary | ICD-10-CM | POA: Diagnosis not present

## 2023-02-03 DIAGNOSIS — Z1732 Human epidermal growth factor receptor 2 negative status: Secondary | ICD-10-CM | POA: Insufficient documentation

## 2023-02-03 DIAGNOSIS — G4700 Insomnia, unspecified: Secondary | ICD-10-CM

## 2023-02-03 DIAGNOSIS — Z5111 Encounter for antineoplastic chemotherapy: Secondary | ICD-10-CM | POA: Insufficient documentation

## 2023-02-03 DIAGNOSIS — C50919 Malignant neoplasm of unspecified site of unspecified female breast: Secondary | ICD-10-CM

## 2023-02-03 DIAGNOSIS — Z170 Estrogen receptor positive status [ER+]: Secondary | ICD-10-CM | POA: Insufficient documentation

## 2023-02-03 DIAGNOSIS — C50312 Malignant neoplasm of lower-inner quadrant of left female breast: Secondary | ICD-10-CM | POA: Diagnosis not present

## 2023-02-03 DIAGNOSIS — Z90710 Acquired absence of both cervix and uterus: Secondary | ICD-10-CM | POA: Diagnosis not present

## 2023-02-03 DIAGNOSIS — C7951 Secondary malignant neoplasm of bone: Secondary | ICD-10-CM | POA: Insufficient documentation

## 2023-02-03 DIAGNOSIS — R160 Hepatomegaly, not elsewhere classified: Secondary | ICD-10-CM

## 2023-02-03 DIAGNOSIS — C792 Secondary malignant neoplasm of skin: Secondary | ICD-10-CM | POA: Diagnosis not present

## 2023-02-03 MED ORDER — FULVESTRANT 250 MG/5ML IM SOSY
500.0000 mg | PREFILLED_SYRINGE | INTRAMUSCULAR | Status: DC
  Administered 2023-02-03: 500 mg via INTRAMUSCULAR
  Filled 2023-02-03: qty 10

## 2023-02-03 MED ORDER — ZOLPIDEM TARTRATE 10 MG PO TABS
ORAL_TABLET | ORAL | 1 refills | Status: DC
Start: 2023-02-03 — End: 2023-04-03

## 2023-02-03 MED ORDER — INFLUENZA VAC A&B SURF ANT ADJ 0.5 ML IM SUSY
0.5000 mL | PREFILLED_SYRINGE | Freq: Once | INTRAMUSCULAR | Status: AC
  Administered 2023-02-03: 0.5 mL via INTRAMUSCULAR
  Filled 2023-02-03: qty 0.5

## 2023-02-03 MED ORDER — INFLUENZA VIRUS VACC SPLIT PF (FLUZONE) 0.5 ML IM SUSY: 0.5000 mL | PREFILLED_SYRINGE | Freq: Once | INTRAMUSCULAR | Status: DC

## 2023-02-04 ENCOUNTER — Telehealth: Payer: Self-pay | Admitting: Hematology

## 2023-03-10 ENCOUNTER — Inpatient Hospital Stay: Payer: Medicare HMO | Attending: Hematology

## 2023-03-10 ENCOUNTER — Encounter: Payer: Self-pay | Admitting: Hematology

## 2023-03-10 ENCOUNTER — Other Ambulatory Visit: Payer: Self-pay

## 2023-03-10 DIAGNOSIS — C50312 Malignant neoplasm of lower-inner quadrant of left female breast: Secondary | ICD-10-CM | POA: Diagnosis not present

## 2023-03-10 DIAGNOSIS — Z170 Estrogen receptor positive status [ER+]: Secondary | ICD-10-CM | POA: Insufficient documentation

## 2023-03-10 DIAGNOSIS — Z5111 Encounter for antineoplastic chemotherapy: Secondary | ICD-10-CM | POA: Diagnosis not present

## 2023-03-10 DIAGNOSIS — R160 Hepatomegaly, not elsewhere classified: Secondary | ICD-10-CM

## 2023-03-10 DIAGNOSIS — C50919 Malignant neoplasm of unspecified site of unspecified female breast: Secondary | ICD-10-CM

## 2023-03-10 MED ORDER — FULVESTRANT 250 MG/5ML IM SOSY
500.0000 mg | PREFILLED_SYRINGE | INTRAMUSCULAR | Status: DC
  Administered 2023-03-10: 500 mg via INTRAMUSCULAR

## 2023-03-12 ENCOUNTER — Other Ambulatory Visit: Payer: Self-pay | Admitting: Hematology

## 2023-03-12 DIAGNOSIS — C50312 Malignant neoplasm of lower-inner quadrant of left female breast: Secondary | ICD-10-CM

## 2023-03-13 ENCOUNTER — Other Ambulatory Visit: Payer: Self-pay | Admitting: Hematology

## 2023-04-02 ENCOUNTER — Other Ambulatory Visit: Payer: Self-pay | Admitting: Nurse Practitioner

## 2023-04-02 DIAGNOSIS — G4700 Insomnia, unspecified: Secondary | ICD-10-CM

## 2023-04-03 ENCOUNTER — Encounter: Payer: Self-pay | Admitting: Hematology

## 2023-04-06 NOTE — Assessment & Plan Note (Addendum)
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 Donell Beers. Oncotype RS of 4 (low risk). She received adjuvant radiation. She attempted letrozole previously but did not tolerate due to colitis (pt has Crohn's). -she developed a rash in early 2022. Excisional biopsy by Dr. Arita Miss on 07/25/20 confirmed invasive carcinoma, felt to represent recurrence of breast cancer. She declined further work up with [place] PET scan and was subsequently lost to f/u. -she developed liver and bone mets in 03/2021.  -Due to her colitis, she may not tolerate CDK4/6 inhibitor well (due to potential diarrhea). After a lengthy discussion, she agreed to fulvestrant injections. She received her first dose on 05/15/21. She has tolerated well. -baseline CA 27.29 06/14/21 was elevated at 375.9. This has been steadily decreasing on treatment -restaging CT CAP 02/25/22 showed good treatment response: improvement to liver lesions and bilateral pleural effusions, resolution of LLQ nodule, stable osseous metastatic lesions. No evidence of new metastatic disease. -She is clinically stable, does have fatigue and manageable diarrhea -Restaging CT and bone scan from 01/23/2023 showed overall stable disease

## 2023-04-07 ENCOUNTER — Inpatient Hospital Stay: Payer: Medicare HMO

## 2023-04-07 ENCOUNTER — Inpatient Hospital Stay: Payer: Medicare HMO | Admitting: Hematology

## 2023-04-07 ENCOUNTER — Inpatient Hospital Stay: Payer: Medicare HMO | Attending: Hematology

## 2023-04-07 ENCOUNTER — Ambulatory Visit (HOSPITAL_COMMUNITY)
Admission: RE | Admit: 2023-04-07 | Discharge: 2023-04-07 | Disposition: A | Discharge status: Home / Self Care | Payer: Medicare HMO | Source: Ambulatory Visit | Attending: Hematology | Admitting: Hematology

## 2023-04-07 VITALS — BP 117/71 | HR 74 | Temp 97.3°F | Resp 18 | Wt 160.5 lb

## 2023-04-07 DIAGNOSIS — C792 Secondary malignant neoplasm of skin: Secondary | ICD-10-CM | POA: Insufficient documentation

## 2023-04-07 DIAGNOSIS — K5090 Crohn's disease, unspecified, without complications: Secondary | ICD-10-CM | POA: Insufficient documentation

## 2023-04-07 DIAGNOSIS — Z5111 Encounter for antineoplastic chemotherapy: Secondary | ICD-10-CM | POA: Insufficient documentation

## 2023-04-07 DIAGNOSIS — C50312 Malignant neoplasm of lower-inner quadrant of left female breast: Secondary | ICD-10-CM | POA: Insufficient documentation

## 2023-04-07 DIAGNOSIS — Z170 Estrogen receptor positive status [ER+]: Secondary | ICD-10-CM | POA: Insufficient documentation

## 2023-04-07 DIAGNOSIS — Z1721 Progesterone receptor positive status: Secondary | ICD-10-CM | POA: Insufficient documentation

## 2023-04-07 DIAGNOSIS — J90 Pleural effusion, not elsewhere classified: Secondary | ICD-10-CM | POA: Diagnosis not present

## 2023-04-07 DIAGNOSIS — C50919 Malignant neoplasm of unspecified site of unspecified female breast: Secondary | ICD-10-CM

## 2023-04-07 DIAGNOSIS — R0600 Dyspnea, unspecified: Secondary | ICD-10-CM | POA: Diagnosis not present

## 2023-04-07 DIAGNOSIS — C787 Secondary malignant neoplasm of liver and intrahepatic bile duct: Secondary | ICD-10-CM | POA: Diagnosis not present

## 2023-04-07 DIAGNOSIS — E871 Hypo-osmolality and hyponatremia: Secondary | ICD-10-CM | POA: Insufficient documentation

## 2023-04-07 DIAGNOSIS — Z1732 Human epidermal growth factor receptor 2 negative status: Secondary | ICD-10-CM | POA: Insufficient documentation

## 2023-04-07 DIAGNOSIS — R160 Hepatomegaly, not elsewhere classified: Secondary | ICD-10-CM

## 2023-04-07 DIAGNOSIS — C7951 Secondary malignant neoplasm of bone: Secondary | ICD-10-CM | POA: Insufficient documentation

## 2023-04-07 LAB — CBC WITH DIFFERENTIAL (CANCER CENTER ONLY)
Abs Immature Granulocytes: 0.03 10*3/uL (ref 0.00–0.07)
Basophils Absolute: 0.1 10*3/uL (ref 0.0–0.1)
Basophils Relative: 1 %
Eosinophils Absolute: 0.1 10*3/uL (ref 0.0–0.5)
Eosinophils Relative: 1 %
HCT: 44.9 % (ref 36.0–46.0)
Hemoglobin: 15.2 g/dL — ABNORMAL HIGH (ref 12.0–15.0)
Immature Granulocytes: 0 %
Lymphocytes Relative: 13 %
Lymphs Abs: 1 10*3/uL (ref 0.7–4.0)
MCH: 32.1 pg (ref 26.0–34.0)
MCHC: 33.9 g/dL (ref 30.0–36.0)
MCV: 94.7 fL (ref 80.0–100.0)
Monocytes Absolute: 0.6 10*3/uL (ref 0.1–1.0)
Monocytes Relative: 8 %
Neutro Abs: 5.9 10*3/uL (ref 1.7–7.7)
Neutrophils Relative %: 77 %
Platelet Count: 245 10*3/uL (ref 150–400)
RBC: 4.74 MIL/uL (ref 3.87–5.11)
RDW: 12.2 % (ref 11.5–15.5)
WBC Count: 7.6 10*3/uL (ref 4.0–10.5)
nRBC: 0 % (ref 0.0–0.2)

## 2023-04-07 LAB — CMP (CANCER CENTER ONLY)
ALT: 10 U/L (ref 0–44)
AST: 15 U/L (ref 15–41)
Albumin: 4 g/dL (ref 3.5–5.0)
Alkaline Phosphatase: 72 U/L (ref 38–126)
Anion gap: 7 (ref 5–15)
BUN: 17 mg/dL (ref 8–23)
CO2: 27 mmol/L (ref 22–32)
Calcium: 9.6 mg/dL (ref 8.9–10.3)
Chloride: 99 mmol/L (ref 98–111)
Creatinine: 1.06 mg/dL — ABNORMAL HIGH (ref 0.44–1.00)
GFR, Estimated: 53 mL/min — ABNORMAL LOW (ref >60)
Glucose, Bld: 107 mg/dL — ABNORMAL HIGH (ref 70–99)
Potassium: 4.2 mmol/L (ref 3.5–5.1)
Sodium: 133 mmol/L — ABNORMAL LOW (ref 135–145)
Total Bilirubin: 0.6 mg/dL (ref <1.2)
Total Protein: 7 g/dL (ref 6.5–8.1)

## 2023-04-07 MED ORDER — FULVESTRANT 250 MG/5ML IM SOSY
500.0000 mg | PREFILLED_SYRINGE | Freq: Once | INTRAMUSCULAR | Status: AC
  Administered 2023-04-07: 500 mg via INTRAMUSCULAR
  Filled 2023-04-07: qty 10

## 2023-04-07 NOTE — Progress Notes (Signed)
[facility]   [phone] [phone]   Clinic Follow up Note   Patient Care Team: [name], [name], DO as PCP - General [name], MD as Consulting Physician (Breast Surgery) Pyrtle, Carie Caddy, MD as Consulting Physician (Gastroenterology) [name], MD as Referring Physician (Dermatology) Allena Napoleon, MD as Consulting Physician (Plastic Surgery) Malachy Mood, MD as Consulting Physician (Hematology)  Date of Service:  04/07/2023  CHIEF COMPLAINT: f/u of metastatic breast cancer  CURRENT THERAPY:  Fulvestrant injection every 4 weeks  Oncology History   Cancer of lower-inner quadrant of left female breast [facility]) 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 Donell Beers. Oncotype RS of 4 (low risk). She received adjuvant radiation. She attempted letrozole previously but did not tolerate due to colitis (pt has Crohn's). -she developed a rash in early 2022. Excisional biopsy by Dr. Arita Miss on 07/25/20 confirmed invasive carcinoma, felt to represent recurrence of breast cancer. She declined further work up with [place] PET scan and was subsequently lost to f/u. -she developed liver and bone mets in 03/2021.  -Due to her colitis, she may not tolerate CDK4/6 inhibitor well (due to potential diarrhea). After a lengthy discussion, she agreed to fulvestrant injections. She received her first dose on 05/15/21. She has tolerated well. -baseline CA 27.29 06/14/21 was elevated at 375.9. This has been steadily decreasing on treatment -restaging CT CAP 02/25/22 showed good treatment response: improvement to liver lesions and bilateral pleural effusions, resolution of LLQ nodule, stable osseous metastatic lesions. No evidence of new metastatic disease. -She is clinically stable, does have fatigue and manageable diarrhea -Restaging CT and bone scan  from 01/23/2023 showed overall stable disease     Assessment and Plan    Metastatic Breast Cancer   Follow-up for metastatic breast cancer. Reports increased fatigue and dyspnea. Last CT in September showed a small pleural effusion. Current sodium level is 133, slightly low but improved. Tumor markers and blood counts pending. On current injection treatment for almost two years. Discussed potential for cancer progression and need for further imaging. Discussed risks of CT contrast reaction and alternatives including PET scan. If PET scan not approved, CT with premedication or without contrast discussed. Emphasized importance of imaging to assess cancer progression.   - Order chest x-ray to assess for pleural effusion   - Order PET scan if insurance approves; otherwise, consider CT scan with premedication to prevent allergic reaction   - Administer injection today   - Schedule follow-up appointment in January for review of PET scan results and next injection    Pleural Effusion   Reports increased dyspnea and fatigue. Last CT showed a small pleural effusion. Physical exam did not reveal significant breath sounds indicative of large effusion. Plan to confirm with imaging. Discussed potential need for drainage if moderate or large effusion is present to improve breathing.   - Order chest x-ray to assess for pleural effusion   - If moderate or large effusion is present, arrange for drainage procedure    Hyponatremia   Sodium level is 133, slightly low but improved. Not likely the cause of current symptoms.   - Monitor sodium levels    Crohn's Disease   Reports chronic loose and irregular bowel movements without blood. No new symptoms reported. Discussed importance of high-protein diet to improve energy levels.   - Monitor symptoms   - Encourage  high-protein diet to improve energy levels    General Health Maintenance   Diet and nutritional intake discussed. Emphasis on the importance of protein  intake due to Crohn's disease and overall health. Discussed B12 injections and their role in energy levels.   - Encourage high-protein diet   - Monitor B12 levels and ensure regular B12 injections    Plan -We did 5 minutes walking, her oxygen level remains to be 92 to 95% -Will proceed fulvestrant injection today - Schedule follow-up appointment in January   -Order PET scan to be scheduled after Christmas   -Chest x-ray today to evaluate the pleural effusion, will call with x-ray results and arrange drainage if needed.         SUMMARY OF ONCOLOGIC HISTORY: Oncology History Overview Note  Cancer of lower-inner quadrant of left female breast [facility])   Staging form: Breast, AJCC 7th Edition     Clinical stage from 12/06/2014: Stage IA (T1c, N0, M0) - Unsigned     Pathologic stage from 12/13/2014: Stage IA (T1c, N0, cM0) - Unsigned      Cancer of lower-inner quadrant of left female breast (HCC)  11/28/2014 Breast US   Highly suspicious 1.6 cm irregular shadowing mass at the site of palpable concern in the subareolar lower inner quadrant of the left breast at approximately 630.   11/29/2014 Receptors her2   Estrogen Receptor: 100%, POSITIVE, STRONG STAINING INTENSITY (PERFORMED MANUALLY) Progesterone Receptor: 95%, POSITIVE, STRONG STAINING INTENSITY (PERFORMED MANUALLY) Proliferation Marker Ki67: 10% (PERFORMED MANUALLY), Her2neu negative   11/29/2014 Initial Biopsy   Breast, left, needle core biopsy, subareolar about 6:30 - INVASIVE DUCTAL CARCINOMA, SEE COMMENT.   11/30/2014 Initial Diagnosis   Cancer of lower-inner quadrant of left female breast   12/13/2014 Pathology Results   Left breast lumpectomy showed a 1.7 cm invasive ductal carcinoma, grade 1, margins were negative, (+) LVI, 5 Sentinel lymph nodes were negative,   12/13/2014 Oncotype testing   RS 4, which predicts 10-year risk of distant recurrence 4% with tamoxifen alone.   02/05/2015 - 02/26/2015 Radiation Therapy   left  breast radiaiton    03/18/2015 -  Anti-estrogen oral therapy   She tried letrozole for 2-3 weeks, could not tolerate and stopped.   07/25/2020 Pathology Results   FINAL MICROSCOPIC DIAGNOSIS: A. SKIN, LEFT CHEST, EXCISION: - Invasive carcinoma involving dermis and subcutaneous soft tissue, consistent with patient's clinical history of invasive ductal carcinoma, see comment - Deep and lateral resection margins are negative for carcinoma  Appears grade I  ADDENDUM: PROGNOSTIC INDICATOR RESULTS: Immunohistochemical and morphometric analysis performed manually The tumor cells are NEGATIVE for Her2 (1+). Estrogen Receptor: POSITIVE, 90%, STRONG STAINING Progesterone Receptor: POSITIVE, 95%, STRONG STAINING Proliferation Marker Ki-67: 5%    04/19/2021 Imaging   EXAM: CT ABDOMEN AND PELVIS WITH CONTRAST  IMPRESSION: 1. Multiple hypodense hepatic masses, as detailed above, highly suspicious for metastatic disease. Differential include infectious/inflammatory process. Further evaluation with MRI examination with contrast would be helpful. 2. Moderate left and small right pleural effusion. 3. Postsurgical changes for prior hemicolectomy. No bowel obstruction, colitis or diverticulitis.   04/24/2021 Pathology Results   FINAL MICROSCOPIC DIAGNOSIS:   A. LIVER, BIOPSY:  -  Metastatic carcinoma  -  See comment   COMMENT:  By immunohistochemistry, the neoplastic cells are positive for  cytokeratin 7, ER (100%, strong), GATA3 and PAX8 (patchy weak) but negative for cytokeratin 20 and TTF-1.  Overall, the morphology and immunophenotype supports metastasis from the patient's known mammary carcinoma.  ADDENDUM:  By immunohistochemistry, HER-2 is EQUIVOCAL (2+).    ADDENDUM:  FLOURESCENCE IN-SITU HYBRIDIZATION RESULTS:  GROUP 5:   HER2 **NEGATIVE**    05/11/2021 Imaging   EXAM: CT CHEST WITH CONTRAST  IMPRESSION: 1. Bilateral pleural effusions and lower lobe  compressive atelectasis, left greater than right. 2. Nonspecific right hilar lymphadenopathy. 3. Bony lytic lesions involving the left posterior fifth rib, left T5 transverse process, and left aspect of the T10 vertebral body, consistent with bony metastases. 4. Patchy right upper lobe ground-glass airspace disease, which may be inflammatory or infectious. 5. Slight decrease in size of the left lobe subcapsular liver hematoma. No evidence of active hemorrhage. 6. Stable liver hypodensities consistent with presumed metastatic disease. Please correlate with previous biopsy results.   05/13/2021 Pathology Results   Specimen Submitted:  A. PLEURAL FLUID, LEFT, THORACENTESIS:   FINAL MICROSCOPIC DIAGNOSIS:  - Atypical mesothelial cells present  - Chronic inflammation    01/23/2023 Imaging   CT chest, abdomen, and pelvis with contrast  IMPRESSION: 1. Stable hepatic and osseous metastatic disease. No evidence of disease progression. 2. Small left pleural effusion, slightly increased. 3.  Aortic atherosclerosis (ICD10-I70.0).      Discussed the use of AI scribe software for clinical note transcription with the patient, who gave verbal consent to proceed.  History of Present Illness   The patient, an 82 year old female with a history of metastatic breast cancer and Crohn's disease, presents with increasing fatigue and shortness of breath. She describes feeling "winded" and needing to rest frequently during daily activities such as doing laundry, cooking, and running errands. She reports needing to sit down every 10-15 minutes due to fatigue and shortness of breath. She also mentions feeling "washed out" and weak.  The patient is currently on injections for her cancer treatment and has been on this regimen for almost two years. She questions whether her symptoms could be due to the injections or her low sodium levels. She also mentions a recent reaction to a CT scan contrast, which resulted in  redness, itching, and a feeling of being in "terrible shape." She was treated with Benadryl and prednisone for this reaction.  In addition to her cancer treatment, the patient also manages her Crohn's disease, which affects her diet and limits her intake of certain foods. She reports eating a lot of bread to maintain her weight but struggles with consuming enough protein and other healthy foods.         All other systems were reviewed with the patient and are negative.  MEDICAL HISTORY:  Past Medical History:  Diagnosis Date   Allergy    Anemia    Anxiety    Arthritis    Basal cell carcinoma    Blood transfusion without reported diagnosis    Bowel perforation (HCC)    Cancer of lower-inner quadrant of female breast (HCC) 11/30/2014   left   Cancer of lower-inner quadrant of left female breast (HCC) 11/30/2014   Clostridium difficile infection    Complication of anesthesia    anxiety post-op after ear surgeries   Crohn's disease of large intestine with abscess (HCC)    Dental crowns present    recent root canal 11/2014   Depression    Family history of adverse reaction to anesthesia    pt's sister has hx. 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   Seasonal allergies    TMJ (temporomandibular joint syndrome)    Ulcerative colitis    Vitamin B12 deficiency     SURGICAL HISTORY: Past Surgical History:  Procedure Laterality Date   ABDOMINAL HYSTERECTOMY     partial   APPENDECTOMY     AUGMENTATION MAMMAPLASTY Bilateral    BASAL CELL CARCINOMA EXCISION Left    nose   BREAST BIOPSY Left 2019   benign   BREAST ENHANCEMENT SURGERY     BREAST LUMPECTOMY Left 2016   BREAST  LUMPECTOMY WITH RADIOACTIVE SEED AND SENTINEL LYMPH NODE BIOPSY Left 12/13/2014   Procedure: LEFT BREAST LUMPECTOMY WITH RADIOACTIVE SEED AND SENTINEL LYMPH NODE BIOPSY;  Surgeon: [name], MD;  Location: [facility];  Service: General;  Laterality: Left;   COLONOSCOPY WITH PROPOFOL  05/28/2011; 06/28/2014   EXPLORATORY LAPAROTOMY  04/08/2018   with end ileostomy takedown and creation of loop ileostomy-- [facility]-Dr Inetta Fermo   FLEXIBLE SIGMOIDOSCOPY N/A 01/08/2016   Procedure: Arnell Sieving;  Surgeon: Ruffin Frederick, MD;  Location: [facility];  Service: Gastroenterology;  Laterality: N/A;  needs MAC due to pain and anxiety   ILEOSTOMY N/A 05/25/2017   Procedure: DILATION OF ILEOSTOMY;  Surgeon: Emelia Loron, MD;  Location: [facility];  Service: General;  Laterality: N/A;   ILEOSTOMY  02/11/2016   END ileostomy.  Dr Daphine Deutscher   ILEOSTOMY CLOSURE N/A 06/02/2017   Procedure: RESECTION OF ILEAL STRICTURE; ILEOSTOMY REVISION ;  Surgeon: Karie Soda, MD;  Location: [facility];  Service: General;  Laterality: N/A;   ILEOSTOMY DILATION  05/25/2017   DILITATION OF ILEAL STRICTURE   ileostomy stricture resection  06/02/2017   with revision   INCISION AND DRAINAGE ABSCESS  2018   INCISION AND DRAINAGE PERIRECTAL ABSCESS N/A 09/28/2013   Procedure: IRRIGATION AND DEBRIDEMENT PERIANAL ABSCESS, proctoscopy;  Surgeon: Kandis Cocking, MD;  Location: [facility];  Service: General;  Laterality: N/A;   INCISION AND DRAINAGE PERIRECTAL ABSCESS N/A 01/22/2016   Procedure: IRRIGATION AND DEBRIDEMENT PERIRECTAL ABSCESS;  Surgeon: Darnell Level, MD;  Location: [facility];  Service: General;  Laterality: N/A;   INNER EAR SURGERY Bilateral    LAPAROSCOPIC DIVERTED COLOSTOMY N/A 02/11/2016   Procedure: LAPAROSCOPIC DIVERTED ILEOSTOMY;  Surgeon: Luretha Murphy, MD;  Location: [facility];  Service: General;  Laterality: N/A;   LAPAROSCOPY N/A 03/23/2017   Procedure: LAPAROSCOPY  DIAGNOSTIC, EXPLORATORY LAPAROTOMY,  BOWEL RESECTION;  Surgeon: Avel Peace, MD;  Location: [facility];  Service: General;  Laterality: N/A;    I have reviewed the social history and family history with the patient and they are unchanged from previous note.  ALLERGIES:  is allergic to nsaids, codeine, iodinated contrast media, mesalamine, omnipaque [iohexol], oxycodone, lactose intolerance (gi), norco [hydrocodone-acetaminophen], other, and sulfa antibiotics.  MEDICATIONS:  Current Outpatient Medications  Medication Sig Dispense Refill   acetaminophen (TYLENOL) 500 MG tablet You can take 2 tablets every 6 hours as needed for pain.  Use this as your primary pain control.  Use Tramadol as a last resort.  You can buy this over the counter at any drug store.    DO NOT TAKE MORE THAN 4000 MG OF TYLENOL PER DAY.  IT CAN HARM YOUR LIVER. 30 tablet 0  ALPRAZolam (XANAX) 0.25 MG tablet TAKE 1 TABLET(0.25 MG) BY MOUTH TWICE DAILY AS NEEDED FOR ANXIETY 60 tablet 0   cephALEXin (KEFLEX) 500 MG capsule Take 1 capsule (500 mg total) by mouth 2 (two) times daily. (Patient not taking: Reported on 02/03/2023) 14 capsule 0   cyanocobalamin (VITAMIN B12) 1000 MCG/ML injection INJECT INTO THE MUSCLE ONCE PER MONTH 3 mL 11   diphenoxylate-atropine (LOMOTIL) 2.5-0.025 MG tablet Take 1-2 tablets by mouth 3-4 times daily AS NEEDED for severe diarrhea 240 tablet 6   famotidine (PEPCID) 20 MG tablet Take 1 tablet (20 mg total) by mouth 2 (two) times daily. (Patient not taking: Reported on 02/03/2023) 180 tablet 6   FLUoxetine (PROZAC) 20 MG capsule TAKE 1 CAPSULE(20 MG) BY MOUTH DAILY 90 capsule 1   fluticasone (FLONASE) 50 MCG/ACT nasal spray Place 2 sprays into both nostrils daily. (Patient taking differently: Place 2 sprays into both nostrils daily as needed for rhinitis.) 16 g 6   loperamide (IMODIUM) 2 MG capsule Take 1 capsule (2 mg total) by mouth as needed for diarrhea or loose stools. 90 capsule 1    ondansetron (ZOFRAN-ODT) 4 MG disintegrating tablet DISSOLVE ONE TABLET BY MOUTH EVERY 6 HOURS AS NEEDED FOR NAUSEA AND VOMITING 30 tablet 3   predniSONE (DELTASONE) 5 MG tablet 1 po qd 90 tablet 3   zolpidem (AMBIEN) 10 MG tablet TAKE 1 TABLET(10 MG) BY MOUTH AT BEDTIME AS NEEDED FOR SLEEP 30 tablet 1   No current facility-administered medications for this visit.    PHYSICAL EXAMINATION: ECOG PERFORMANCE STATUS: 2 - Symptomatic, <50% confined to bed  Vitals:   04/07/23 1350  BP: 117/71  Pulse: 74  Resp: 18  Temp: (!) 97.3 F (36.3 C)  SpO2: 97%   Wt Readings from Last 3 Encounters:  04/07/23 160 lb 8 oz (72.8 kg)  02/03/23 158 lb 4.8 oz (71.8 kg)  01/01/23 157 lb 9.6 oz (71.5 kg)     GENERAL:alert, no distress and comfortable SKIN: skin color, texture, turgor are normal, no rashes or significant lesions EYES: normal, Conjunctiva are pink and non-injected, sclera clear NECK: supple, thyroid normal size, non-tender, without nodularity LYMPH:  no palpable lymphadenopathy in the cervical, axillary  LUNGS: clear to auscultation and percussion with normal breathing effort HEART: regular rate & rhythm and no murmurs and no lower extremity edema ABDOMEN:abdomen soft, non-tender and normal bowel sounds Musculoskeletal:no cyanosis of digits and no clubbing  NEURO: alert & oriented x 3 with fluent speech, no focal motor/sensory deficits   LABORATORY DATA:  I have reviewed the data as listed    Latest Ref Rng & Units 04/07/2023   12:38 PM 01/23/2023    9:12 AM 01/01/2023   12:21 PM  CBC  WBC 4.0 - 10.5 K/uL 7.6  7.6  11.9   Hemoglobin 12.0 - 15.0 g/dL 82.9  56.2  13.0   Hematocrit 36.0 - 46.0 % 44.9  41.0  42.9   Platelets 150 - 400 K/uL 245  210  254         Latest Ref Rng & Units 04/07/2023   12:38 PM 01/23/2023    9:12 AM 01/01/2023   12:21 PM  CMP  Glucose 70 - 99 mg/dL 865  91  784   BUN 8 - 23 mg/dL 17  11  17    Creatinine 0.44 - 1.00 mg/dL 6.96  2.95  2.84   Sodium  135 - 145 mmol/L 133  132  135   Potassium  3.5 - 5.1 mmol/L 4.2  3.7  4.4   Chloride 98 - 111 mmol/L 99  100  105   CO2 22 - 32 mmol/L 27  26  24    Calcium 8.9 - 10.3 mg/dL 9.6  8.6  9.2   Total Protein 6.5 - 8.1 g/dL 7.0  6.4  6.9   Total Bilirubin <1.2 mg/dL 0.6  0.5  0.5   Alkaline Phos 38 - 126 U/L 72  68  71   AST 15 - 41 U/L 15  15  15    ALT 0 - 44 U/L 10  9  12        RADIOGRAPHIC STUDIES: I have personally reviewed the radiological images as listed and agreed with the findings in the report. No results found.    Orders Placed This Encounter  Procedures   DG Chest 2 View    Standing Status:   Future    Standing Expiration Date:   04/06/2024    Order Specific Question:   Reason for Exam (SYMPTOM  OR DIAGNOSIS REQUIRED)    Answer:   DYSPNEA, rule out pleural effusion    Order Specific Question:   Preferred imaging location?    Answer:   [facility]   NM PET Image Initial (PI) Skull Base To Thigh    Standing Status:   Future    Standing Expiration Date:   04/06/2024    Order Specific Question:   If indicated for the ordered procedure, I authorize the administration of a radiopharmaceutical per Radiology protocol    Answer:   Yes    Order Specific Question:   Preferred imaging location?    Answer:   [facility]   All questions were answered. The patient knows to call the clinic with any problems, questions or concerns. No barriers to learning was detected. The total time spent in the appointment was 30 minutes.     Malachy Mood, MD 04/07/2023

## 2023-04-08 ENCOUNTER — Telehealth: Payer: Self-pay | Admitting: Hematology

## 2023-04-08 LAB — CANCER ANTIGEN 27.29: CA 27.29: 42.2 U/mL — ABNORMAL HIGH (ref 0.0–38.6)

## 2023-04-08 NOTE — Telephone Encounter (Signed)
patient very upset and does not wish to schedule any further until results from xrays are read. Forwarding patient concerns to RN. Patient stated cannot properly intake air and has not been able to move around all day.

## 2023-04-09 ENCOUNTER — Telehealth: Payer: Self-pay

## 2023-04-09 NOTE — Telephone Encounter (Signed)
LVM for pt to contact Dr. Latanya Maudlin office regarding scheduling f/u appt with Dr. Mosetta Putt and scheduling PET Scan.  This nurse received 2 Secure Chat messages from [facility] Scheduler for Dr. Mosetta Putt and Leodis Rains [place] Scheduler regarding conversations with pt refusing to schedule appts Dr. Mosetta Putt ordered.  Called pt to find out why she's refusing but was unable to reach pt.  Requested if pt could please contact Dr. Latanya Maudlin office so further discussion can be had regarding this matter.  Awaiting pt's return call.

## 2023-04-11 ENCOUNTER — Other Ambulatory Visit: Payer: Self-pay

## 2023-04-11 DIAGNOSIS — Z170 Estrogen receptor positive status [ER+]: Secondary | ICD-10-CM

## 2023-04-13 ENCOUNTER — Other Ambulatory Visit: Payer: Self-pay

## 2023-04-16 ENCOUNTER — Telehealth: Payer: Self-pay

## 2023-04-16 NOTE — Telephone Encounter (Addendum)
Called the patient and relayed the message below as per Dr. Mosetta Putt. Sister stated that they will set up appointment for PET and OV after the holiday.    Result Note Please make sure that pt knows her CXR results, will watch her left pleural effusion, no other concerns, thanks. Malachy Mood     ----- Message from Malachy Mood sent at 04/15/2023 12:37 PM EST ----- Please let pt or her sister know that her tumor marker has increased lately and encourage her to get PET and OV scheduled, thanks   Malachy Mood

## 2023-04-24 ENCOUNTER — Other Ambulatory Visit: Payer: Self-pay

## 2023-05-11 ENCOUNTER — Ambulatory Visit (HOSPITAL_COMMUNITY)
Admission: RE | Admit: 2023-05-11 | Discharge: 2023-05-11 | Disposition: A | Discharge status: Home / Self Care | Payer: Medicare HMO | Source: Ambulatory Visit | Attending: Hematology | Admitting: Hematology

## 2023-05-11 DIAGNOSIS — Z170 Estrogen receptor positive status [ER+]: Secondary | ICD-10-CM | POA: Insufficient documentation

## 2023-05-11 DIAGNOSIS — C50912 Malignant neoplasm of unspecified site of left female breast: Secondary | ICD-10-CM | POA: Diagnosis not present

## 2023-05-11 DIAGNOSIS — C50312 Malignant neoplasm of lower-inner quadrant of left female breast: Secondary | ICD-10-CM | POA: Insufficient documentation

## 2023-05-11 DIAGNOSIS — R911 Solitary pulmonary nodule: Secondary | ICD-10-CM | POA: Diagnosis not present

## 2023-05-11 LAB — GLUCOSE, CAPILLARY: Glucose-Capillary: 91 mg/dL (ref 70–99)

## 2023-05-11 MED ORDER — FLUDEOXYGLUCOSE F - 18 (FDG) INJECTION
8.0000 | Freq: Once | INTRAVENOUS | Status: AC | PRN
  Administered 2023-05-11: 8 via INTRAVENOUS

## 2023-05-17 NOTE — Assessment & Plan Note (Signed)
 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 Donell Beers. Oncotype RS of 4 (low risk). She received adjuvant radiation. She attempted letrozole previously but did not tolerate due to colitis (pt has Crohn's). -she developed a rash in early 2022. Excisional biopsy by Dr. Arita Miss on 07/25/20 confirmed invasive carcinoma, felt to represent recurrence of breast cancer. She declined further work up with [place] PET scan and was subsequently lost to f/u. -she developed liver and bone mets in 03/2021.  -Due to her colitis, she may not tolerate CDK4/6 inhibitor well (due to potential diarrhea). After a lengthy discussion, she agreed to fulvestrant injections. She received her first dose on 05/15/21. She has tolerated well. -baseline CA 27.29 06/14/21 was elevated at 375.9. This has been steadily decreasing on treatment -restaging CT CAP 02/25/22 showed good treatment response: improvement to liver lesions and bilateral pleural effusions, resolution of LLQ nodule, stable osseous metastatic lesions. No evidence of new metastatic disease. -She is clinically stable, does have fatigue and manageable diarrhea -Restaging CT and bone scan from 01/23/2023 showed overall stable disease

## 2023-05-18 ENCOUNTER — Inpatient Hospital Stay: Payer: Medicare HMO | Attending: Hematology

## 2023-05-18 ENCOUNTER — Inpatient Hospital Stay: Payer: Medicare HMO

## 2023-05-18 ENCOUNTER — Inpatient Hospital Stay: Payer: Medicare HMO | Admitting: Hematology

## 2023-05-18 VITALS — BP 137/76 | HR 76 | Temp 97.4°F | Resp 16 | Wt 160.1 lb

## 2023-05-18 DIAGNOSIS — G4700 Insomnia, unspecified: Secondary | ICD-10-CM | POA: Diagnosis not present

## 2023-05-18 DIAGNOSIS — Z170 Estrogen receptor positive status [ER+]: Secondary | ICD-10-CM

## 2023-05-18 DIAGNOSIS — Z1732 Human epidermal growth factor receptor 2 negative status: Secondary | ICD-10-CM | POA: Diagnosis not present

## 2023-05-18 DIAGNOSIS — C7951 Secondary malignant neoplasm of bone: Secondary | ICD-10-CM | POA: Insufficient documentation

## 2023-05-18 DIAGNOSIS — Z90710 Acquired absence of both cervix and uterus: Secondary | ICD-10-CM | POA: Insufficient documentation

## 2023-05-18 DIAGNOSIS — Z923 Personal history of irradiation: Secondary | ICD-10-CM | POA: Insufficient documentation

## 2023-05-18 DIAGNOSIS — C50312 Malignant neoplasm of lower-inner quadrant of left female breast: Secondary | ICD-10-CM

## 2023-05-18 DIAGNOSIS — C50919 Malignant neoplasm of unspecified site of unspecified female breast: Secondary | ICD-10-CM

## 2023-05-18 DIAGNOSIS — F419 Anxiety disorder, unspecified: Secondary | ICD-10-CM | POA: Diagnosis not present

## 2023-05-18 DIAGNOSIS — C787 Secondary malignant neoplasm of liver and intrahepatic bile duct: Secondary | ICD-10-CM | POA: Insufficient documentation

## 2023-05-18 DIAGNOSIS — R160 Hepatomegaly, not elsewhere classified: Secondary | ICD-10-CM

## 2023-05-18 DIAGNOSIS — C792 Secondary malignant neoplasm of skin: Secondary | ICD-10-CM | POA: Diagnosis not present

## 2023-05-18 DIAGNOSIS — Z5111 Encounter for antineoplastic chemotherapy: Secondary | ICD-10-CM | POA: Diagnosis not present

## 2023-05-18 LAB — CMP (CANCER CENTER ONLY)
ALT: 7 U/L (ref 0–44)
AST: 15 U/L (ref 15–41)
Albumin: 3.9 g/dL (ref 3.5–5.0)
Alkaline Phosphatase: 69 U/L (ref 38–126)
Anion gap: 6 (ref 5–15)
BUN: 13 mg/dL (ref 8–23)
CO2: 26 mmol/L (ref 22–32)
Calcium: 9.2 mg/dL (ref 8.9–10.3)
Chloride: 101 mmol/L (ref 98–111)
Creatinine: 1.03 mg/dL — ABNORMAL HIGH (ref 0.44–1.00)
GFR, Estimated: 55 mL/min — ABNORMAL LOW (ref >60)
Glucose, Bld: 102 mg/dL — ABNORMAL HIGH (ref 70–99)
Potassium: 3.6 mmol/L (ref 3.5–5.1)
Sodium: 133 mmol/L — ABNORMAL LOW (ref 135–145)
Total Bilirubin: 0.6 mg/dL (ref 0.0–1.2)
Total Protein: 6.6 g/dL (ref 6.5–8.1)

## 2023-05-18 LAB — CBC WITH DIFFERENTIAL (CANCER CENTER ONLY)
Abs Immature Granulocytes: 0.02 10*3/uL (ref 0.00–0.07)
Basophils Absolute: 0.1 10*3/uL (ref 0.0–0.1)
Basophils Relative: 1 %
Eosinophils Absolute: 0.1 10*3/uL (ref 0.0–0.5)
Eosinophils Relative: 1 %
HCT: 44 % (ref 36.0–46.0)
Hemoglobin: 14.7 g/dL (ref 12.0–15.0)
Immature Granulocytes: 0 %
Lymphocytes Relative: 21 %
Lymphs Abs: 1.6 10*3/uL (ref 0.7–4.0)
MCH: 31.7 pg (ref 26.0–34.0)
MCHC: 33.4 g/dL (ref 30.0–36.0)
MCV: 94.8 fL (ref 80.0–100.0)
Monocytes Absolute: 0.7 10*3/uL (ref 0.1–1.0)
Monocytes Relative: 9 %
Neutro Abs: 5 10*3/uL (ref 1.7–7.7)
Neutrophils Relative %: 68 %
Platelet Count: 225 10*3/uL (ref 150–400)
RBC: 4.64 MIL/uL (ref 3.87–5.11)
RDW: 11.9 % (ref 11.5–15.5)
WBC Count: 7.4 10*3/uL (ref 4.0–10.5)
nRBC: 0 % (ref 0.0–0.2)

## 2023-05-18 MED ORDER — ALPRAZOLAM 0.25 MG PO TABS: 0.2500 mg | ORAL_TABLET | Freq: Two times a day (BID) | ORAL | 0 refills | Status: DC | PRN

## 2023-05-18 MED ORDER — FULVESTRANT 250 MG/5ML IM SOSY
500.0000 mg | PREFILLED_SYRINGE | INTRAMUSCULAR | Status: DC
  Administered 2023-05-18: 500 mg via INTRAMUSCULAR
  Filled 2023-05-18: qty 10

## 2023-05-18 MED ORDER — LIDOCAINE-PRILOCAINE 2.5-2.5 % EX CREA: 1.0000 | TOPICAL_CREAM | CUTANEOUS | 0 refills | Status: DC | PRN

## 2023-05-18 MED ORDER — ZOLPIDEM TARTRATE 10 MG PO TABS: ORAL_TABLET | ORAL | 1 refills | Status: DC

## 2023-05-18 NOTE — Patient Instructions (Signed)
Fulvestrant Injection What is this medication? FULVESTRANT (ful VES trant) treats breast cancer. It works by blocking the hormone estrogen in breast tissue, which prevents breast cancer cells from spreading or growing. This medicine may be used for other purposes; ask your health care provider or pharmacist if you have questions. COMMON BRAND NAME(S): FASLODEX What should I tell my care team before I take this medication? They need to know if you have any of these conditions: Bleeding disorder Liver disease Low blood cell levels (white cells, red cells, and platelets) An unusual or allergic reaction to fulvestrant, other medications, foods, dyes, or preservatives Pregnant or trying to get pregnant Breastfeeding How should I use this medication? This medication is injected into a muscle. It is given by your care team in a hospital or clinic setting. Talk to your care team about the use of this medication in children. Special care may be needed. Overdosage: If you think you have taken too much of this medicine contact a poison control center or emergency room at once. NOTE: This medicine is only for you. Do not share this medicine with others. What if I miss a dose? Keep appointments for follow-up doses. It is important not to miss your dose. Call your care team if you are unable to keep an appointment. What may interact with this medication? Fluoroestradiol F18 This list may not describe all possible interactions. Give your health care provider a list of all the medicines, herbs, non-prescription drugs, or dietary supplements you use. Also tell them if you smoke, drink alcohol, or use illegal drugs. Some items may interact with your medicine. What should I watch for while using this medication? Your condition will be monitored carefully while you are receiving this medication. You may need blood work done while you are taking this medication. This medication is injected into a muscle. Talk  to your care team if you also take medications that prevent or treat blood clots, such as warfarin. Blood thinners may increase the risk of bleeding or bruising in the muscle where this medication is injected. The benefits of this medication may outweigh the risks. Your care team can help you find the option that works for you. They can also help limit the risk of bleeding. Talk to your care team if you may be pregnant. Serious birth defects can occur if you take this medication during pregnancy and for 1 year after the last dose. You will need a negative pregnancy test before starting this medication. Contraception is recommended while taking this medication and for 1 year after the last dose. Your care team can help you find the option that works for you. Do not breastfeed while taking this medication and for 1 year after the last dose. This medication may cause infertility. Talk to your care team if you are concerned about your fertility. What side effects may I notice from receiving this medication? Side effects that you should report to your care team as soon as possible: Allergic reactions or angioedema--skin rash, itching or hives, swelling of the face, eyes, lips, tongue, arms, or legs, trouble swallowing or breathing Pain, tingling, or numbness in the hands or feet Side effects that usually do not require medical attention (report to your care team if they continue or are bothersome): Bone, joint, or muscle pain Constipation Headache Hot flashes Nausea Pain, redness, or irritation at injection site Unusual weakness or fatigue This list may not describe all possible side effects. Call your doctor for medical advice about side  effects. You may report side effects to FDA at 1-800-FDA-1088. Where should I keep my medication? This medication is given in a hospital or clinic. It will not be stored at home. NOTE: This sheet is a summary. It may not cover all possible information. If you have  questions about this medicine, talk to your doctor, pharmacist, or health care provider.  2024 Elsevier/Gold Standard (2022-12-19 00:00:00)

## 2023-05-18 NOTE — Progress Notes (Signed)
[facility]   Telephone:(336) [phone] Fax:(336) [phone]   Clinic Follow up Note   Patient Care Team: [name], [name], DO as PCP - General [name], MD as Consulting Physician (Breast Surgery) Pyrtle, Carie Caddy, MD as Consulting Physician (Gastroenterology) [name], MD as Referring Physician (Dermatology) Allena Napoleon, MD as Consulting Physician (Plastic Surgery) Malachy Mood, MD as Consulting Physician (Hematology)  Date of Service:  05/18/2023  CHIEF COMPLAINT: f/u of metastatic breast cancer  CURRENT THERAPY:  Fulvestrant injection every month  Oncology History   Cancer of lower-inner quadrant of left Barnes breast [facility]) 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 Donell Beers. Oncotype RS of 4 (low risk). She received adjuvant radiation. She attempted letrozole previously but did not tolerate due to colitis (pt has Crohn's). -she developed a rash in early 2022. Excisional biopsy by Dr. Arita Miss on 07/25/20 confirmed invasive carcinoma, felt to represent recurrence of breast cancer. She declined further work up with [place] PET scan and was subsequently lost to f/u. -she developed liver and bone mets in 03/2021.  -Due to her colitis, she may not tolerate CDK4/6 inhibitor well (due to potential diarrhea). After a lengthy discussion, she agreed to fulvestrant injections. She received her first dose on 05/15/21. She has tolerated well. -baseline CA Sara.29 06/14/21 was elevated at 375.9. This has been steadily decreasing on treatment -restaging CT CAP 02/25/22 showed good treatment response: improvement to liver lesions and bilateral pleural effusions, resolution of LLQ nodule, stable osseous metastatic lesions. No evidence of new metastatic disease. -She is clinically stable, does have fatigue and manageable diarrhea -Restaging CT and bone scan from  9/Sara/2024 showed overall stable disease    Assessment and Plan    Metastatic Breast Cancer Follow-up for metastatic breast cancer with metastases to bone and liver. Preliminary PET scan review suggests controlled disease with no new significant hypermetabolic lesions in the liver and persistent bone lesions. Reports symptom improvement, including reduced dyspnea. Pain likely due to arthritis and bone metastases. Prefers to continue current treatment regimen despite previous pain and dyspnea from injections. Discussed the importance of maintaining the current regimen for disease control. PET scan results will be confirmed by a radiologist. 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 Reports pain in the back, knee, and hands, with back pain likely related to cancer and other pains likely due to arthritis. Pain is manageable without prescription pain medication. Discussed potential radiation therapy if pain becomes localized and severe. Emphasized the importance of pain management for quality of life. - Monitor pain levels - Consider radiation therapy if pain becomes localized and severe - Discuss use of lidocaine cream for injection site pain  Anxiety Intermittent anxiety managed with Xanax. Does not take it daily but wants availability for as-needed use. Discussed the importance of not taking Xanax and Ambien together due to potential adverse effects. - Refill Xanax prescription as needed, up to twice daily  General Health Maintenance Discussed the importance of maintaining a positive attitude and mental health. Reviewed the importance of having a living will and advance directives in place. - Encourage positive attitude and mental health - Discuss living will  and advance directives  Plan -I reviewed her PET scan, and we will call her when the formal report is back -Will proceed to fulvestrant injection today and continue monthly - Schedule next appointment in two months         SUMMARY OF ONCOLOGIC HISTORY: Oncology History Overview Note  Cancer of lower-inner quadrant of left Barnes breast [name])   Staging form: Breast, AJCC 7th Edition     Clinical stage from 12/06/2014: Stage IA (T1c, N0, M0) - Unsigned     Pathologic stage from 12/13/2014: Stage IA (T1c, N0, cM0) - Unsigned      Cancer of lower-inner quadrant of left Barnes breast (HCC)  11/28/2014 Breast US   Highly suspicious 1.6 cm irregular shadowing mass at the site of palpable concern in the subareolar lower inner quadrant of the left breast at approximately 630.   11/29/2014 Receptors her2   Estrogen Receptor: 100%, POSITIVE, STRONG STAINING INTENSITY (PERFORMED MANUALLY) Progesterone Receptor: 95%, POSITIVE, STRONG STAINING INTENSITY (PERFORMED MANUALLY) Proliferation Marker Ki67: 10% (PERFORMED MANUALLY), Her2neu negative   11/29/2014 Initial Biopsy   Breast, left, needle core biopsy, subareolar about 6:30 - INVASIVE DUCTAL CARCINOMA, SEE COMMENT.   11/30/2014 Initial Diagnosis   Cancer of lower-inner quadrant of left Barnes breast   12/13/2014 Pathology Results   Left breast lumpectomy showed a 1.7 cm invasive ductal carcinoma, grade 1, margins were negative, (+) LVI, 5 Sentinel lymph nodes were negative,   12/13/2014 Oncotype testing   RS 4, which predicts 10-year risk of distant recurrence 4% with tamoxifen alone.   02/05/2015 - 02/26/2015 Radiation Therapy   left breast radiaiton    03/18/2015 -  Anti-estrogen oral therapy   She tried letrozole for 2-3 weeks, could not tolerate and stopped.   07/25/2020 Pathology Results   FINAL MICROSCOPIC DIAGNOSIS: A. SKIN, LEFT CHEST, EXCISION: - Invasive carcinoma involving dermis and subcutaneous soft tissue, consistent with  patient's clinical history of invasive ductal carcinoma, see comment - Deep and lateral resection margins are negative for carcinoma  Appears grade I  ADDENDUM: PROGNOSTIC INDICATOR RESULTS: Immunohistochemical and morphometric analysis performed manually The tumor cells are NEGATIVE for Her2 (1+). Estrogen Receptor: POSITIVE, 90%, STRONG STAINING Progesterone Receptor: POSITIVE, 95%, STRONG STAINING Proliferation Marker Ki-67: 5%    04/19/2021 Imaging   EXAM: CT ABDOMEN AND PELVIS WITH CONTRAST  IMPRESSION: 1. Multiple hypodense hepatic masses, as detailed above, highly suspicious for metastatic disease. Differential include infectious/inflammatory process. Further evaluation with MRI examination with contrast would be helpful. 2. Moderate left and small right pleural effusion. 3. Postsurgical changes for prior hemicolectomy. No bowel obstruction, colitis or diverticulitis.   04/24/2021 Pathology Results   FINAL MICROSCOPIC DIAGNOSIS:   A. LIVER, BIOPSY:  -  Metastatic carcinoma  -  See comment   COMMENT:  By immunohistochemistry, the neoplastic cells are positive for  cytokeratin 7, ER (100%, strong), GATA3 and PAX8 (patchy weak) but negative for cytokeratin 20 and TTF-1.  Overall, the morphology and immunophenotype supports metastasis from the patient's known mammary carcinoma.   ADDENDUM:  By immunohistochemistry, HER-2 is EQUIVOCAL (2+).    ADDENDUM:  FLOURESCENCE IN-SITU HYBRIDIZATION RESULTS:  GROUP 5:   HER2 **NEGATIVE**    05/11/2021 Imaging   EXAM: CT CHEST WITH CONTRAST  IMPRESSION: 1. Bilateral pleural effusions and lower lobe compressive atelectasis, left greater than right. 2. Nonspecific right hilar lymphadenopathy. 3. Bony lytic lesions involving the left posterior fifth rib, left T5 transverse process, and left aspect of the T10 vertebral body, consistent with bony metastases. 4. Patchy right  upper lobe ground-glass airspace disease, which may  be inflammatory or infectious. 5. Slight decrease in size of the left lobe subcapsular liver hematoma. No evidence of active hemorrhage. 6. Stable liver hypodensities consistent with presumed metastatic disease. Please correlate with previous biopsy results.   05/13/2021 Pathology Results   Specimen Submitted:  A. PLEURAL FLUID, LEFT, THORACENTESIS:   FINAL MICROSCOPIC DIAGNOSIS:  - Atypical mesothelial cells present  - Chronic inflammation    9/Sara/2024 Imaging   CT chest, abdomen, and pelvis with contrast  IMPRESSION: 1. Stable hepatic and osseous metastatic disease. No evidence of disease progression. 2. Small left pleural effusion, slightly increased. 3.  Aortic atherosclerosis (ICD10-I70.0).      Discussed the use of AI scribe software for clinical note transcription with the patient, who gave verbal consent to proceed.  History of Present Illness   The patient, an 83 year old Barnes with a history of metastatic breast cancer, presents for a follow-up visit. She reports an improvement in her breathlessness, stating she is "not as winded" as she was previously. She also mentions that she has been experiencing pain in various parts of her body, including her knee, back, fingers, and hands. She attributes some of this pain to her age and possible arthritis, but also acknowledges that some of it may be due to her cancer.  The patient also discusses her anxiety medication, Xanax, which she does not take daily but wants to have on hand in case of need. She mentions that she has been without this medication for a while but would like it to be on file for when she does need it.  In addition, the patient talks about her experience with her cancer treatment injections. She mentions that she has been doing better since it has been over a month since her last injection. However, she expresses some concern about the pain associated with the injections, particularly on her right side. She also  discusses her desire to be present for her grandson's upcoming wedding in November, indicating a positive outlook and motivation for her ongoing treatment.         All other systems were reviewed with the patient and are negative.  MEDICAL HISTORY:  Past Medical History:  Diagnosis Date   Allergy    Anemia    Anxiety    Arthritis    Basal cell carcinoma    Blood transfusion without reported diagnosis    Bowel perforation (HCC)    Cancer of lower-inner quadrant of Barnes breast (HCC) 11/30/2014   left   Cancer of lower-inner quadrant of left Barnes breast (HCC) 11/30/2014   Clostridium difficile infection    Complication of anesthesia    anxiety post-op after ear surgeries   Crohn's disease of large intestine with abscess (HCC)    Dental crowns present    recent root canal 11/2014   Depression    Family history of adverse reaction to anesthesia    pt's sister has hx. 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   Seasonal allergies    TMJ (temporomandibular joint syndrome)    Ulcerative colitis    Vitamin B12 deficiency  SURGICAL HISTORY: Past Surgical History:  Procedure Laterality Date   ABDOMINAL HYSTERECTOMY     partial   APPENDECTOMY     AUGMENTATION MAMMAPLASTY Bilateral    BASAL CELL CARCINOMA EXCISION Left    nose   BREAST BIOPSY Left 2019   benign   BREAST ENHANCEMENT SURGERY     BREAST LUMPECTOMY Left 2016   BREAST LUMPECTOMY WITH RADIOACTIVE SEED AND SENTINEL LYMPH NODE BIOPSY Left 12/13/2014   Procedure: LEFT BREAST LUMPECTOMY WITH RADIOACTIVE SEED AND SENTINEL LYMPH NODE BIOPSY;  Surgeon: [name], MD;  Location:  SURGERY CENTER;  Service:  General;  Laterality: Left;   COLONOSCOPY WITH PROPOFOL  05/28/2011; 06/28/2014   EXPLORATORY LAPAROTOMY  04/08/2018   with end ileostomy takedown and creation of loop ileostomy-- [facility]-Dr Inetta Fermo   FLEXIBLE SIGMOIDOSCOPY N/A 01/08/2016   Procedure: Arnell Sieving;  Surgeon: Ruffin Frederick, MD;  Location: [facility];  Service: Gastroenterology;  Laterality: N/A;  needs MAC due to pain and anxiety   ILEOSTOMY N/A 05/25/2017   Procedure: DILATION OF ILEOSTOMY;  Surgeon: Emelia Loron, MD;  Location: [facility];  Service: General;  Laterality: N/A;   ILEOSTOMY  02/11/2016   END ileostomy.  Dr Daphine Deutscher   ILEOSTOMY CLOSURE N/A 06/02/2017   Procedure: RESECTION OF ILEAL STRICTURE; ILEOSTOMY REVISION ;  Surgeon: Karie Soda, MD;  Location: [facility];  Service: General;  Laterality: N/A;   ILEOSTOMY DILATION  05/25/2017   DILITATION OF ILEAL STRICTURE   ileostomy stricture resection  06/02/2017   with revision   INCISION AND DRAINAGE ABSCESS  2018   INCISION AND DRAINAGE PERIRECTAL ABSCESS N/A 09/28/2013   Procedure: IRRIGATION AND DEBRIDEMENT PERIANAL ABSCESS, proctoscopy;  Surgeon: Kandis Cocking, MD;  Location: [facility];  Service: General;  Laterality: N/A;   INCISION AND DRAINAGE PERIRECTAL ABSCESS N/A 01/22/2016   Procedure: IRRIGATION AND DEBRIDEMENT PERIRECTAL ABSCESS;  Surgeon: Darnell Level, MD;  Location: [facility];  Service: General;  Laterality: N/A;   INNER EAR SURGERY Bilateral    LAPAROSCOPIC DIVERTED COLOSTOMY N/A 02/11/2016   Procedure: LAPAROSCOPIC DIVERTED ILEOSTOMY;  Surgeon: Luretha Murphy, MD;  Location: [facility];  Service: General;  Laterality: N/A;   LAPAROSCOPY N/A 03/23/2017   Procedure: LAPAROSCOPY DIAGNOSTIC, EXPLORATORY LAPAROTOMY,  BOWEL RESECTION;  Surgeon: Avel Peace, MD;  Location: [facility];  Service: General;  Laterality: N/A;    I have reviewed the social history and family history with the patient and they are unchanged from  previous note.  ALLERGIES:  is allergic to nsaids, codeine, iodinated contrast media, mesalamine, omnipaque [iohexol], oxycodone, lactose intolerance (gi), norco [hydrocodone-acetaminophen], other, and sulfa antibiotics.  MEDICATIONS:  Current Outpatient Medications  Medication Sig Dispense Refill   lidocaine-prilocaine (EMLA) cream Apply 1 Application topically as needed (FOR INEJCTION PAIN). 30 g 0   acetaminophen (TYLENOL) 500 MG tablet You can take 2 tablets every 6 hours as needed for pain.  Use this as your primary pain control.  Use Tramadol as a last resort.  You can buy this over the counter at any drug store.    DO NOT TAKE MORE THAN 4000 MG OF TYLENOL PER DAY.  IT CAN HARM YOUR LIVER. 30 tablet 0   ALPRAZolam (XANAX) 0.25 MG tablet Take 1 tablet (0.25 mg total) by mouth 2 (two) times daily as needed for anxiety. 60 tablet 0   cephALEXin (KEFLEX) 500 MG capsule Take 1 capsule (500 mg total) by mouth 2 (two) times daily. (Patient not taking: Reported on  02/03/2023) 14 capsule 0   cyanocobalamin (VITAMIN B12) 1000 MCG/ML injection INJECT INTO THE MUSCLE ONCE PER MONTH 3 mL 11   diphenoxylate-atropine (LOMOTIL) 2.5-0.025 MG tablet Take 1-2 tablets by mouth 3-4 times daily AS NEEDED for severe diarrhea 240 tablet 6   famotidine (PEPCID) 20 MG tablet Take 1 tablet (20 mg total) by mouth 2 (two) times daily. (Patient not taking: Reported on 02/03/2023) 180 tablet 6   FLUoxetine (PROZAC) 20 MG capsule TAKE 1 CAPSULE(20 MG) BY MOUTH DAILY 90 capsule 1   fluticasone (FLONASE) 50 MCG/ACT nasal spray Place 2 sprays into both nostrils daily. (Patient taking differently: Place 2 sprays into both nostrils daily as needed for rhinitis.) 16 g 6   loperamide (IMODIUM) 2 MG capsule Take 1 capsule (2 mg total) by mouth as needed for diarrhea or loose stools. 90 capsule 1   ondansetron (ZOFRAN-ODT) 4 MG disintegrating tablet DISSOLVE ONE TABLET BY MOUTH EVERY 6 HOURS AS NEEDED FOR NAUSEA AND VOMITING 30  tablet 3   predniSONE (DELTASONE) 5 MG tablet 1 po qd 90 tablet 3   zolpidem (AMBIEN) 10 MG tablet TAKE 1 TABLET(10 MG) BY MOUTH AT BEDTIME AS NEEDED FOR SLEEP 30 tablet 1   No current facility-administered medications for this visit.   Facility-Administered Medications Ordered in Other Visits  Medication Dose Route Frequency Provider Last Rate Last Admin   fulvestrant (FASLODEX) injection 500 mg  500 mg Intramuscular Q30 days Malachy Mood, MD   500 mg at 05/18/23 1200    PHYSICAL EXAMINATION: ECOG PERFORMANCE STATUS: 2 - Symptomatic, <50% confined to bed  Vitals:   05/18/23 1102  BP: 137/76  Pulse: 76  Resp: 16  Temp: (!) 97.4 F (36.3 C)  SpO2: 98%   Wt Readings from Last 3 Encounters:  05/18/23 160 lb 1.6 oz (72.6 kg)  04/07/23 160 lb 8 oz (72.8 kg)  02/03/23 158 lb 4.8 oz (71.8 kg)     GENERAL:alert, no distress and comfortable SKIN: skin color, texture, turgor are normal, no rashes or significant lesions EYES: normal, Conjunctiva are pink and non-injected, sclera clear NECK: supple, thyroid normal size, non-tender, without nodularity LYMPH:  no palpable lymphadenopathy in the cervical, axillary  LUNGS: clear to auscultation and percussion with normal breathing effort HEART: regular rate & rhythm and no murmurs and no lower extremity edema ABDOMEN:abdomen soft, non-tender and normal bowel sounds Musculoskeletal:no cyanosis of digits and no clubbing  NEURO: alert & oriented x 3 with fluent speech, no focal motor/sensory deficits   LABORATORY DATA:  I have reviewed the data as listed    Latest Ref Rng & Units 05/18/2023   10:36 AM 04/07/2023   12:38 PM 9/Sara/2024    9:12 AM  CBC  WBC 4.0 - 10.5 K/uL 7.4  7.6  7.6   Hemoglobin 12.0 - 15.0 g/dL 46.9  62.9  52.8   Hematocrit 36.0 - 46.0 % 44.0  44.9  41.0   Platelets 150 - 400 K/uL 225  245  210         Latest Ref Rng & Units 05/18/2023   10:36 AM 04/07/2023   12:38 PM 9/Sara/2024    9:12 AM  CMP  Glucose 70 - 99  mg/dL 413  244  91   BUN 8 - 23 mg/dL 13  17  11    Creatinine 0.44 - 1.00 mg/dL 0.10  2.72  5.36   Sodium 135 - 145 mmol/L 133  133  132   Potassium 3.5 - 5.1  mmol/L 3.6  4.2  3.7   Chloride 98 - 111 mmol/L 101  99  100   CO2 22 - 32 mmol/L 26  Sara  26    Calcium 8.9 - 10.3 mg/dL 9.2  9.6  8.6   Total Protein 6.5 - 8.1 g/dL 6.6  7.0  6.4   Total Bilirubin 0.0 - 1.2 mg/dL 0.6  0.6  0.5   Alkaline Phos 38 - 126 U/L 69  72  68   AST 15 - 41 U/L 15  15  15    ALT 0 - 44 U/L 7  10  9        RADIOGRAPHIC STUDIES: I have personally reviewed the radiological images as listed and agreed with the findings in the report. No results found.    No orders of the defined types were placed in this encounter.  All questions were answered. The patient knows to call the clinic with any problems, questions or concerns. No barriers to learning was detected. The total time spent in the appointment was 25 minutes.     Malachy Mood, MD 05/18/2023

## 2023-05-20 ENCOUNTER — Telehealth: Payer: Self-pay

## 2023-05-20 ENCOUNTER — Other Ambulatory Visit: Payer: Self-pay | Admitting: Gastroenterology

## 2023-05-20 ENCOUNTER — Other Ambulatory Visit: Payer: Self-pay

## 2023-05-20 NOTE — Telephone Encounter (Addendum)
Called patient to relay message below as per Dr. Mosetta Putt. Patient voiced full understanding with no further questions or concerns.   ----- Message from Malachy Mood sent at 05/20/2023  7:14 AM EST ----- Please let pt know her PET results, similar to what I told her during her last visit, no new concerns, thanks  Malachy Mood

## 2023-06-15 ENCOUNTER — Other Ambulatory Visit: Payer: Self-pay

## 2023-06-15 ENCOUNTER — Inpatient Hospital Stay: Payer: Medicare HMO | Attending: Hematology

## 2023-06-15 VITALS — BP 144/73 | HR 74 | Temp 98.6°F | Resp 18

## 2023-06-15 DIAGNOSIS — C50312 Malignant neoplasm of lower-inner quadrant of left female breast: Secondary | ICD-10-CM | POA: Diagnosis not present

## 2023-06-15 DIAGNOSIS — Z5111 Encounter for antineoplastic chemotherapy: Secondary | ICD-10-CM | POA: Diagnosis not present

## 2023-06-15 DIAGNOSIS — Z1732 Human epidermal growth factor receptor 2 negative status: Secondary | ICD-10-CM | POA: Diagnosis not present

## 2023-06-15 DIAGNOSIS — C792 Secondary malignant neoplasm of skin: Secondary | ICD-10-CM | POA: Diagnosis not present

## 2023-06-15 DIAGNOSIS — Z1721 Progesterone receptor positive status: Secondary | ICD-10-CM | POA: Insufficient documentation

## 2023-06-15 DIAGNOSIS — C787 Secondary malignant neoplasm of liver and intrahepatic bile duct: Secondary | ICD-10-CM | POA: Insufficient documentation

## 2023-06-15 DIAGNOSIS — Z170 Estrogen receptor positive status [ER+]: Secondary | ICD-10-CM | POA: Insufficient documentation

## 2023-06-15 DIAGNOSIS — C50919 Malignant neoplasm of unspecified site of unspecified female breast: Secondary | ICD-10-CM

## 2023-06-15 DIAGNOSIS — R160 Hepatomegaly, not elsewhere classified: Secondary | ICD-10-CM

## 2023-06-15 DIAGNOSIS — C7951 Secondary malignant neoplasm of bone: Secondary | ICD-10-CM | POA: Insufficient documentation

## 2023-06-15 MED ORDER — FULVESTRANT 250 MG/5ML IM SOSY
500.0000 mg | PREFILLED_SYRINGE | INTRAMUSCULAR | Status: DC
  Administered 2023-06-15: 500 mg via INTRAMUSCULAR
  Filled 2023-06-15: qty 10

## 2023-07-15 NOTE — Assessment & Plan Note (Signed)
 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 Donell Beers. Oncotype RS of 4 (low risk). She received adjuvant radiation. She attempted letrozole previously but did not tolerate due to colitis (pt has Crohn's). -she developed a rash in early 2022. Excisional biopsy by Dr. Arita Miss on 07/25/20 confirmed invasive carcinoma, felt to represent recurrence of breast cancer. She declined further work up with [place] PET scan and was subsequently lost to f/u. -she developed liver and bone mets in 03/2021.  -Due to her colitis, she may not tolerate CDK4/6 inhibitor well (due to potential diarrhea). After a lengthy discussion, she agreed to fulvestrant injections. She received her first dose on 05/15/21. She has tolerated well. -baseline CA 27.29 06/14/21 was elevated at 375.9. This has been steadily decreasing on treatment -restaging CT CAP 02/25/22 showed good treatment response: improvement to liver lesions and bilateral pleural effusions, resolution of LLQ nodule, stable osseous metastatic lesions. No evidence of new metastatic disease. -She is clinically stable, does have fatigue and manageable diarrhea -Restaging CT and bone scan from 01/23/2023 showed overall stable disease  -PET in 04/2023 showed hypermetabolic mediastinal, hilar nodes, and bone lesions.

## 2023-07-16 ENCOUNTER — Inpatient Hospital Stay (HOSPITAL_BASED_OUTPATIENT_CLINIC_OR_DEPARTMENT_OTHER): Payer: Medicare HMO | Admitting: Hematology

## 2023-07-16 ENCOUNTER — Inpatient Hospital Stay: Payer: Medicare HMO

## 2023-07-16 ENCOUNTER — Inpatient Hospital Stay: Payer: Medicare HMO | Attending: Hematology

## 2023-07-16 ENCOUNTER — Encounter: Payer: Self-pay | Admitting: Hematology

## 2023-07-16 DIAGNOSIS — C50919 Malignant neoplasm of unspecified site of unspecified female breast: Secondary | ICD-10-CM

## 2023-07-16 DIAGNOSIS — C7951 Secondary malignant neoplasm of bone: Secondary | ICD-10-CM | POA: Diagnosis not present

## 2023-07-16 DIAGNOSIS — Z170 Estrogen receptor positive status [ER+]: Secondary | ICD-10-CM | POA: Insufficient documentation

## 2023-07-16 DIAGNOSIS — G4700 Insomnia, unspecified: Secondary | ICD-10-CM | POA: Diagnosis not present

## 2023-07-16 DIAGNOSIS — Z1732 Human epidermal growth factor receptor 2 negative status: Secondary | ICD-10-CM | POA: Insufficient documentation

## 2023-07-16 DIAGNOSIS — R160 Hepatomegaly, not elsewhere classified: Secondary | ICD-10-CM

## 2023-07-16 DIAGNOSIS — C787 Secondary malignant neoplasm of liver and intrahepatic bile duct: Secondary | ICD-10-CM | POA: Insufficient documentation

## 2023-07-16 DIAGNOSIS — C50312 Malignant neoplasm of lower-inner quadrant of left female breast: Secondary | ICD-10-CM | POA: Diagnosis not present

## 2023-07-16 DIAGNOSIS — Z5111 Encounter for antineoplastic chemotherapy: Secondary | ICD-10-CM | POA: Insufficient documentation

## 2023-07-16 DIAGNOSIS — Z1721 Progesterone receptor positive status: Secondary | ICD-10-CM | POA: Diagnosis not present

## 2023-07-16 DIAGNOSIS — M1909 Primary osteoarthritis, other specified site: Secondary | ICD-10-CM | POA: Insufficient documentation

## 2023-07-16 DIAGNOSIS — C792 Secondary malignant neoplasm of skin: Secondary | ICD-10-CM | POA: Insufficient documentation

## 2023-07-16 DIAGNOSIS — E871 Hypo-osmolality and hyponatremia: Secondary | ICD-10-CM | POA: Diagnosis not present

## 2023-07-16 LAB — CBC WITH DIFFERENTIAL (CANCER CENTER ONLY)
Abs Immature Granulocytes: 0.02 10*3/uL (ref 0.00–0.07)
Basophils Absolute: 0 10*3/uL (ref 0.0–0.1)
Basophils Relative: 0 %
Eosinophils Absolute: 0.1 10*3/uL (ref 0.0–0.5)
Eosinophils Relative: 1 %
HCT: 42.3 % (ref 36.0–46.0)
Hemoglobin: 14 g/dL (ref 12.0–15.0)
Immature Granulocytes: 0 %
Lymphocytes Relative: 14 %
Lymphs Abs: 1.2 10*3/uL (ref 0.7–4.0)
MCH: 31.1 pg (ref 26.0–34.0)
MCHC: 33.1 g/dL (ref 30.0–36.0)
MCV: 94 fL (ref 80.0–100.0)
Monocytes Absolute: 0.6 10*3/uL (ref 0.1–1.0)
Monocytes Relative: 7 %
Neutro Abs: 6.7 10*3/uL (ref 1.7–7.7)
Neutrophils Relative %: 78 %
Platelet Count: 209 10*3/uL (ref 150–400)
RBC: 4.5 MIL/uL (ref 3.87–5.11)
RDW: 11.9 % (ref 11.5–15.5)
WBC Count: 8.6 10*3/uL (ref 4.0–10.5)
nRBC: 0 % (ref 0.0–0.2)

## 2023-07-16 LAB — CMP (CANCER CENTER ONLY)
ALT: 8 U/L (ref 0–44)
AST: 14 U/L — ABNORMAL LOW (ref 15–41)
Albumin: 3.8 g/dL (ref 3.5–5.0)
Alkaline Phosphatase: 72 U/L (ref 38–126)
Anion gap: 6 (ref 5–15)
BUN: 9 mg/dL (ref 8–23)
CO2: 25 mmol/L (ref 22–32)
Calcium: 8.7 mg/dL — ABNORMAL LOW (ref 8.9–10.3)
Chloride: 102 mmol/L (ref 98–111)
Creatinine: 0.96 mg/dL (ref 0.44–1.00)
GFR, Estimated: 59 mL/min — ABNORMAL LOW (ref >60)
Glucose, Bld: 97 mg/dL (ref 70–99)
Potassium: 3.8 mmol/L (ref 3.5–5.1)
Sodium: 133 mmol/L — ABNORMAL LOW (ref 135–145)
Total Bilirubin: 0.5 mg/dL (ref 0.0–1.2)
Total Protein: 6.4 g/dL — ABNORMAL LOW (ref 6.5–8.1)

## 2023-07-16 MED ORDER — ZOLPIDEM TARTRATE 10 MG PO TABS: ORAL_TABLET | ORAL | 1 refills | Status: DC

## 2023-07-16 MED ORDER — FULVESTRANT 250 MG/5ML IM SOSY
500.0000 mg | PREFILLED_SYRINGE | Freq: Once | INTRAMUSCULAR | Status: AC
  Administered 2023-07-16: 500 mg via INTRAMUSCULAR
  Filled 2023-07-16: qty 10

## 2023-07-16 NOTE — Progress Notes (Signed)
 [facility]   [phone] [phone]   Clinic Follow up Note   Patient Care Team: Sara Barnes, Sara Congress, DO as PCP - General Sara Lint, MD as Consulting Physician (Breast Surgery) Barnes, Carie Caddy, MD as Consulting Physician (Gastroenterology) Sara Roberts, MD as Referring Physician (Dermatology) Sara Napoleon, MD as Consulting Physician (Plastic Surgery) Sara Mood, MD as Consulting Physician (Hematology)  Date of Service:  07/16/2023  CHIEF COMPLAINT: f/u of breast cancer   CURRENT THERAPY:  Fulvestrant injection monthly  Oncology History   Cancer of lower-inner quadrant of left female breast [facility]) 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 Donell Beers. Oncotype RS of 4 (low risk). She received adjuvant radiation. She attempted letrozole previously but did not tolerate due to colitis (pt has Crohn's). -she developed a rash in early 2022. Excisional biopsy by Dr. Arita Miss on 07/25/20 confirmed invasive carcinoma, felt to represent recurrence of breast cancer. She declined further work up with [place] PET scan and was subsequently lost to f/u. -she developed liver and bone mets in 03/2021.  -Due to her colitis, she may not tolerate CDK4/6 inhibitor well (due to potential diarrhea). After a lengthy discussion, she agreed to fulvestrant injections. She received her first dose on 05/15/21. She has tolerated well. -baseline CA 27.29 06/14/21 was elevated at 375.9. This has been steadily decreasing on treatment -restaging CT CAP 02/25/22 showed good treatment response: improvement to liver lesions and bilateral pleural effusions, resolution of LLQ nodule, stable osseous metastatic lesions. No evidence of new metastatic disease. -She is clinically stable, does have fatigue and manageable diarrhea -Restaging CT and bone scan from 01/23/2023  showed overall stable disease  -PET in 04/2023 showed hypermetabolic mediastinal, hilar nodes, and bone lesions.    Assessment and Plan    Metastatic breast cancer She has metastatic breast cancer with limited disease burden, characterized as slow-progressing. The cancer is present in the chest wall, liver, bones, and mediastinal lymph nodes. The January PET scan indicated metastasis in the thoracic spine (T5) and ribcage, with no involvement in the lumbar region. The liver appears clear on CT, and the pelvic bone and hip joint are unaffected. She experiences rib discomfort due to bone metastasis. Concerns about the cost of PET scans were discussed, suggesting extended intervals between scans if clinically stable. Current treatment aims to control the disease and prevent further spread. - Continue monthly injections for cancer treatment. - Schedule PET scan in six months or longer if clinically stable. - Follow up in three months for blood work and assessment. - Continue monthly visits for injections.  Arthritis She experiences arthritis-related pain in the low back, neck, and shoulders, with stiffness and pain in the coccygeal area. A familial predisposition to arthritis, a previous coccygeal fracture, and an additional lumbar disc contribute to her discomfort. - Encourage staying active and walking as tolerated.  Hyponatremia Her sodium level is slightly below normal at 133 mmol/L, consistent with previous results over the past six months, and is not significantly concerning.  Medication management She is taking Ambien for sleep and has requested a refill. She is also taking Xanax and Prozac but reports reduced use of Xanax. She is concerned about the cost of medications and scans. - Refill Ambien prescription at [facility] on [address] when due.     Plan -I again reviewed her recent PET scan images and discussed the findings -  She is clinically stable, will continue fulvestrant injection  monthly -Lab and follow-up in 3 months    SUMMARY OF ONCOLOGIC HISTORY: Oncology History Overview Note  Cancer of lower-inner quadrant of left female breast [facility])   Staging form: Breast, AJCC 7th Edition     Clinical stage from 12/06/2014: Stage IA (T1c, N0, M0) - Unsigned     Pathologic stage from 12/13/2014: Stage IA (T1c, N0, cM0) - Unsigned      Cancer of lower-inner quadrant of left female breast (HCC)  11/28/2014 Breast US   Highly suspicious 1.6 cm irregular shadowing mass at the site of palpable concern in the subareolar lower inner quadrant of the left breast at approximately 630.   11/29/2014 Receptors her2   Estrogen Receptor: 100%, POSITIVE, STRONG STAINING INTENSITY (PERFORMED MANUALLY) Progesterone Receptor: 95%, POSITIVE, STRONG STAINING INTENSITY (PERFORMED MANUALLY) Proliferation Marker Ki67: 10% (PERFORMED MANUALLY), Her2neu negative   11/29/2014 Initial Biopsy   Breast, left, needle core biopsy, subareolar about 6:30 - INVASIVE DUCTAL CARCINOMA, SEE COMMENT.   11/30/2014 Initial Diagnosis   Cancer of lower-inner quadrant of left female breast   12/13/2014 Pathology Results   Left breast lumpectomy showed a 1.7 cm invasive ductal carcinoma, grade 1, margins were negative, (+) LVI, 5 Sentinel lymph nodes were negative,   12/13/2014 Oncotype testing   RS 4, which predicts 10-year risk of distant recurrence 4% with tamoxifen alone.   02/05/2015 - 02/26/2015 Radiation Therapy   left breast radiaiton    03/18/2015 -  Anti-estrogen oral therapy   She tried letrozole for 2-3 weeks, could not tolerate and stopped.   07/25/2020 Pathology Results   FINAL MICROSCOPIC DIAGNOSIS: A. SKIN, LEFT CHEST, EXCISION: - Invasive carcinoma involving dermis and subcutaneous soft tissue, consistent with patient's clinical history of invasive ductal carcinoma, see comment - Deep and lateral resection margins are negative for carcinoma  Appears grade I  ADDENDUM: PROGNOSTIC INDICATOR  RESULTS: Immunohistochemical and morphometric analysis performed manually The tumor cells are NEGATIVE for Her2 (1+). Estrogen Receptor: POSITIVE, 90%, STRONG STAINING Progesterone Receptor: POSITIVE, 95%, STRONG STAINING Proliferation Marker Ki-67: 5%    04/19/2021 Imaging   EXAM: CT ABDOMEN AND PELVIS WITH CONTRAST  IMPRESSION: 1. Multiple hypodense hepatic masses, as detailed above, highly suspicious for metastatic disease. Differential include infectious/inflammatory process. Further evaluation with MRI examination with contrast would be helpful. 2. Moderate left and small right pleural effusion. 3. Postsurgical changes for prior hemicolectomy. No bowel obstruction, colitis or diverticulitis.   04/24/2021 Pathology Results   FINAL MICROSCOPIC DIAGNOSIS:   A. LIVER, BIOPSY:  -  Metastatic carcinoma  -  See comment   COMMENT:  By immunohistochemistry, the neoplastic cells are positive for  cytokeratin 7, ER (100%, strong), GATA3 and PAX8 (patchy weak) but negative for cytokeratin 20 and TTF-1.  Overall, the morphology and immunophenotype supports metastasis from the patient's known mammary carcinoma.   ADDENDUM:  By immunohistochemistry, HER-2 is EQUIVOCAL (2+).    ADDENDUM:  FLOURESCENCE IN-SITU HYBRIDIZATION RESULTS:  GROUP 5:   HER2 **NEGATIVE**    05/11/2021 Imaging   EXAM: CT CHEST WITH CONTRAST  IMPRESSION: 1. Bilateral pleural effusions and lower lobe compressive atelectasis, left greater than right. 2. Nonspecific right hilar lymphadenopathy. 3. Bony lytic lesions involving the left posterior fifth rib, left T5 transverse process, and left aspect of the T10 vertebral body, consistent with bony metastases. 4. Patchy right upper lobe ground-glass airspace disease, which may be inflammatory or infectious. 5. Slight decrease in size of the left lobe subcapsular liver  hematoma. No evidence of active hemorrhage. 6. Stable liver hypodensities consistent with  presumed metastatic disease. Please correlate with previous biopsy results.   05/13/2021 Pathology Results   Specimen Submitted:  A. PLEURAL FLUID, LEFT, THORACENTESIS:   FINAL MICROSCOPIC DIAGNOSIS:  - Atypical mesothelial cells present  - Chronic inflammation    01/23/2023 Imaging   CT chest, abdomen, and pelvis with contrast  IMPRESSION: 1. Stable hepatic and osseous metastatic disease. No evidence of disease progression. 2. Small left pleural effusion, slightly increased. 3.  Aortic atherosclerosis (ICD10-I70.0).      Discussed the use of AI scribe software for clinical note transcription with the patient, who gave verbal consent to proceed.  History of Present Illness   Sara Barnes is an 83 year old female with metastatic breast cancer who presents for follow-up.  She has metastatic breast cancer with involvement in the chest wall, liver, bones, and lymph nodes. A PET scan in January showed metastasis in the thoracic spine (T5) and ribcage. She experiences discomfort in the rib area, which she associates with cancer involvement.  She experiences fatigue after morning activities and requires rest before continuing with her day.  She is currently taking medication for diarrhea and uses Xanax sparingly. She takes Ambien nightly for sleep and has requested a refill. She also takes Prozac daily.         All other systems were reviewed with the patient and are negative.  MEDICAL HISTORY:  Past Medical History:  Diagnosis Date   Allergy    Anemia    Anxiety    Arthritis    Basal cell carcinoma    Blood transfusion without reported diagnosis    Bowel perforation (HCC)    Cancer of lower-inner quadrant of female breast (HCC) 11/30/2014   left   Cancer of lower-inner quadrant of left female breast (HCC) 11/30/2014   Clostridium difficile infection    Complication of anesthesia    anxiety post-op after ear surgeries   Crohn's disease of large intestine with abscess  (HCC)    Dental crowns present    recent root canal 11/2014   Depression    Family history of adverse reaction to anesthesia    pt's sister has hx. 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   Seasonal allergies    TMJ (temporomandibular joint syndrome)    Ulcerative colitis    Vitamin B12 deficiency     SURGICAL HISTORY: Past Surgical History:  Procedure Laterality Date   ABDOMINAL HYSTERECTOMY     partial   APPENDECTOMY     AUGMENTATION MAMMAPLASTY Bilateral    BASAL CELL CARCINOMA EXCISION Left    nose   BREAST BIOPSY Left 2019   benign   BREAST ENHANCEMENT SURGERY     BREAST LUMPECTOMY Left 2016   BREAST LUMPECTOMY WITH RADIOACTIVE SEED AND SENTINEL LYMPH NODE BIOPSY Left 12/13/2014   Procedure: LEFT BREAST LUMPECTOMY WITH RADIOACTIVE SEED AND SENTINEL LYMPH NODE BIOPSY;  Surgeon: Sara Lint, MD;  Location: [facility];  Service: General;  Laterality: Left;   COLONOSCOPY WITH PROPOFOL  05/28/2011; 06/28/2014   EXPLORATORY LAPAROTOMY  04/08/2018  with end ileostomy takedown and creation of loop ileostomy-- [facility]-Dr Inetta Fermo   FLEXIBLE SIGMOIDOSCOPY N/A 01/08/2016   Procedure: FLEXIBLE SIGMOIDOSCOPY;  Surgeon: Ruffin Frederick, MD;  Location: [facility];  Service: Gastroenterology;  Laterality: N/A;  needs MAC due to pain and anxiety   ILEOSTOMY N/A 05/25/2017   Procedure: DILATION OF ILEOSTOMY;  Surgeon: Emelia Loron, MD;  Location: [facility];  Service: General;  Laterality: N/A;   ILEOSTOMY  02/11/2016   END ileostomy.  Dr Daphine Deutscher   ILEOSTOMY CLOSURE N/A 06/02/2017   Procedure: RESECTION OF ILEAL STRICTURE; ILEOSTOMY REVISION  ;  Surgeon: Karie Soda, MD;  Location: [facility];  Service: General;  Laterality: N/A;   ILEOSTOMY DILATION  05/25/2017   DILITATION OF ILEAL STRICTURE   ileostomy stricture resection  06/02/2017   with revision   INCISION AND DRAINAGE ABSCESS  2018   INCISION AND DRAINAGE PERIRECTAL ABSCESS N/A 09/28/2013   Procedure: IRRIGATION AND DEBRIDEMENT PERIANAL ABSCESS, proctoscopy;  Surgeon: Kandis Cocking, MD;  Location: [facility];  Service: General;  Laterality: N/A;   INCISION AND DRAINAGE PERIRECTAL ABSCESS N/A 01/22/2016   Procedure: IRRIGATION AND DEBRIDEMENT PERIRECTAL ABSCESS;  Surgeon: Darnell Level, MD;  Location: [facility];  Service: General;  Laterality: N/A;   INNER EAR SURGERY Bilateral    LAPAROSCOPIC DIVERTED COLOSTOMY N/A 02/11/2016   Procedure: LAPAROSCOPIC DIVERTED ILEOSTOMY;  Surgeon: Luretha Murphy, MD;  Location: [facility];  Service: General;  Laterality: N/A;   LAPAROSCOPY N/A 03/23/2017   Procedure: LAPAROSCOPY DIAGNOSTIC, EXPLORATORY LAPAROTOMY,  BOWEL RESECTION;  Surgeon: Avel Peace, MD;  Location: [facility];  Service: General;  Laterality: N/A;    I have reviewed the social history and family history with the patient and they are unchanged from previous note.  ALLERGIES:  is allergic to nsaids, codeine, iodinated contrast media, mesalamine, omnipaque [iohexol], oxycodone, lactose intolerance (gi), norco [hydrocodone-acetaminophen], other, and sulfa antibiotics.  MEDICATIONS:  Current Outpatient Medications  Medication Sig Dispense Refill   acetaminophen (TYLENOL) 500 MG tablet You can take 2 tablets every 6 hours as needed for pain.  Use this as your primary pain control.  Use Tramadol as a last resort.  You can buy this over the counter at any drug store.    DO NOT TAKE MORE THAN 4000 MG OF TYLENOL PER DAY.  IT CAN HARM YOUR LIVER. 30 tablet 0   ALPRAZolam (XANAX) 0.25 MG tablet Take 1 tablet (0.25 mg total) by mouth 2 (two) times daily as needed for anxiety. 60 tablet 0    cephALEXin (KEFLEX) 500 MG capsule Take 1 capsule (500 mg total) by mouth 2 (two) times daily. 14 capsule 0   cyanocobalamin (VITAMIN B12) 1000 MCG/ML injection INJECT INTO THE MUSCLE ONCE PER MONTH 3 mL 11   diphenoxylate-atropine (LOMOTIL) 2.5-0.025 MG tablet TAKE 1 TO 2 TABLETS BY MOUTH THREE TO FOUR TIMES DAILY AS NEEDED FOR SEVERE DIARRHEA 240 tablet 3   famotidine (PEPCID) 20 MG tablet Take 1 tablet (20 mg total) by mouth 2 (two) times daily. 180 tablet 6   FLUoxetine (PROZAC) 20 MG capsule TAKE 1 CAPSULE(20 MG) BY MOUTH DAILY 90 capsule 1   fluticasone (FLONASE) 50 MCG/ACT nasal spray Place 2 sprays into both nostrils daily. (Patient taking differently: Place 2 sprays into both nostrils daily as needed for rhinitis.) 16 g 6   lidocaine-prilocaine (EMLA) cream Apply 1 Application topically as needed (FOR INEJCTION PAIN). 30 g 0   loperamide (IMODIUM) 2 MG capsule Take  1 capsule (2 mg total) by mouth as needed for diarrhea or loose stools. 90 capsule 1   ondansetron (ZOFRAN-ODT) 4 MG disintegrating tablet DISSOLVE ONE TABLET BY MOUTH EVERY 6 HOURS AS NEEDED FOR NAUSEA AND VOMITING 30 tablet 3   predniSONE (DELTASONE) 5 MG tablet TAKE 1 TABLET BY MOUTH EVERY DAY 90 tablet 3   zolpidem (AMBIEN) 10 MG tablet TAKE 1 TABLET(10 MG) BY MOUTH AT BEDTIME AS NEEDED FOR SLEEP 30 tablet 1   No current facility-administered medications for this visit.    PHYSICAL EXAMINATION: ECOG PERFORMANCE STATUS: 2 - Symptomatic, <50% confined to bed  Vitals:   07/16/23 1156  BP: (!) 142/82  Pulse: 81  Resp: 20  Temp: 98 F (36.7 C)  SpO2: 95%   Wt Readings from Last 3 Encounters:  07/16/23 160 lb 6.4 oz (72.8 kg)  05/18/23 160 lb 1.6 oz (72.6 kg)  04/07/23 160 lb 8 oz (72.8 kg)     GENERAL:alert, no distress and comfortable SKIN: skin color, texture, turgor are normal, no rashes or significant lesions EYES: normal, Conjunctiva are pink and non-injected, sclera clear NECK: supple, thyroid normal  size, non-tender, without nodularity LYMPH:  no palpable lymphadenopathy in the cervical, axillary  LUNGS: clear to auscultation and percussion with normal breathing effort HEART: regular rate & rhythm and no murmurs and no lower extremity edema ABDOMEN:abdomen soft, non-tender and normal bowel sounds Musculoskeletal:no cyanosis of digits and no clubbing  NEURO: alert & oriented x 3 with fluent speech, no focal motor/sensory deficits           LABORATORY DATA:  I have reviewed the data as listed    Latest Ref Rng & Units 07/16/2023   11:19 AM 05/18/2023   10:36 AM 04/07/2023   12:38 PM  CBC  WBC 4.0 - 10.5 K/uL 8.6  7.4  7.6   Hemoglobin 12.0 - 15.0 g/dL 40.9  81.1  91.4   Hematocrit 36.0 - 46.0 % 42.3  44.0  44.9   Platelets 150 - 400 K/uL 209  225  245         Latest Ref Rng & Units 07/16/2023   11:19 AM 05/18/2023   10:36 AM 04/07/2023   12:38 PM  CMP  Glucose 70 - 99 mg/dL 97  782  956   BUN 8 - 23 mg/dL 9  13  17    Creatinine 0.44 - 1.00 mg/dL 2.13  0.86  5.78   Sodium 135 - 145 mmol/L 133  133  133   Potassium 3.5 - 5.1 mmol/L 3.8  3.6  4.2   Chloride 98 - 111 mmol/L 102  101  99   CO2 22 - 32 mmol/L 25  26  27    Calcium 8.9 - 10.3 mg/dL 8.7  9.2  9.6   Total Protein 6.5 - 8.1 g/dL 6.4  6.6  7.0   Total Bilirubin 0.0 - 1.2 mg/dL 0.5  0.6  0.6   Alkaline Phos 38 - 126 U/L 72  69  72   AST 15 - 41 U/L 14  15  15    ALT 0 - 44 U/L 8  7  10        RADIOGRAPHIC STUDIES: I have personally reviewed the radiological images as listed and agreed with the findings in the report. No results found.    No orders of the defined types were placed in this encounter.  All questions were answered. The patient knows to call the clinic with any problems, questions  or concerns. No barriers to learning was detected. The total time spent in the appointment was 25 minutes.     Sara Mood, MD 07/16/2023

## 2023-07-17 ENCOUNTER — Telehealth: Payer: Self-pay | Admitting: Hematology

## 2023-07-17 LAB — CANCER ANTIGEN 27.29: CA 27.29: 78.8 U/mL — ABNORMAL HIGH (ref 0.0–38.6)

## 2023-07-17 NOTE — Telephone Encounter (Signed)
 Left patient a voicemail in regards to scheduled appointment times/dates

## 2023-07-22 ENCOUNTER — Other Ambulatory Visit: Payer: Self-pay

## 2023-07-22 DIAGNOSIS — C50919 Malignant neoplasm of unspecified site of unspecified female breast: Secondary | ICD-10-CM

## 2023-07-24 ENCOUNTER — Telehealth: Payer: Self-pay

## 2023-07-24 NOTE — Telephone Encounter (Addendum)
 Called patient to relay message below and let her know about her upcoming app was not able to speak to anyone LM on VM.  ----- Message from Malachy Mood sent at 07/20/2023  7:46 AM EDT ----- Please let pt know her tumor marker is going up, and add Guardant 360 to her next lab draw in a month, and add OV on that day too, thanks   Malachy Mood

## 2023-08-11 ENCOUNTER — Ambulatory Visit: Admitting: Hematology

## 2023-08-11 ENCOUNTER — Inpatient Hospital Stay: Attending: Hematology | Admitting: Hematology

## 2023-08-11 ENCOUNTER — Inpatient Hospital Stay

## 2023-08-11 ENCOUNTER — Encounter: Payer: Self-pay | Admitting: Hematology

## 2023-08-11 ENCOUNTER — Inpatient Hospital Stay (HOSPITAL_BASED_OUTPATIENT_CLINIC_OR_DEPARTMENT_OTHER)

## 2023-08-11 VITALS — BP 120/70 | HR 80 | Temp 97.9°F | Resp 22 | Ht 66.0 in | Wt 158.2 lb

## 2023-08-11 DIAGNOSIS — Z1732 Human epidermal growth factor receptor 2 negative status: Secondary | ICD-10-CM | POA: Insufficient documentation

## 2023-08-11 DIAGNOSIS — Z91011 Allergy to milk products: Secondary | ICD-10-CM | POA: Diagnosis not present

## 2023-08-11 DIAGNOSIS — M6281 Muscle weakness (generalized): Secondary | ICD-10-CM | POA: Diagnosis not present

## 2023-08-11 DIAGNOSIS — Z885 Allergy status to narcotic agent status: Secondary | ICD-10-CM | POA: Diagnosis not present

## 2023-08-11 DIAGNOSIS — C50919 Malignant neoplasm of unspecified site of unspecified female breast: Secondary | ICD-10-CM | POA: Diagnosis not present

## 2023-08-11 DIAGNOSIS — R531 Weakness: Secondary | ICD-10-CM | POA: Diagnosis not present

## 2023-08-11 DIAGNOSIS — J449 Chronic obstructive pulmonary disease, unspecified: Secondary | ICD-10-CM | POA: Diagnosis not present

## 2023-08-11 DIAGNOSIS — F418 Other specified anxiety disorders: Secondary | ICD-10-CM | POA: Diagnosis not present

## 2023-08-11 DIAGNOSIS — S72002A Fracture of unspecified part of neck of left femur, initial encounter for closed fracture: Secondary | ICD-10-CM | POA: Diagnosis present

## 2023-08-11 DIAGNOSIS — R2689 Other abnormalities of gait and mobility: Secondary | ICD-10-CM | POA: Diagnosis not present

## 2023-08-11 DIAGNOSIS — S0003XA Contusion of scalp, initial encounter: Secondary | ICD-10-CM | POA: Diagnosis present

## 2023-08-11 DIAGNOSIS — Z923 Personal history of irradiation: Secondary | ICD-10-CM | POA: Diagnosis not present

## 2023-08-11 DIAGNOSIS — C50312 Malignant neoplasm of lower-inner quadrant of left female breast: Secondary | ICD-10-CM | POA: Diagnosis not present

## 2023-08-11 DIAGNOSIS — Z170 Estrogen receptor positive status [ER+]: Secondary | ICD-10-CM | POA: Diagnosis not present

## 2023-08-11 DIAGNOSIS — K5010 Crohn's disease of large intestine without complications: Secondary | ICD-10-CM | POA: Insufficient documentation

## 2023-08-11 DIAGNOSIS — Z882 Allergy status to sulfonamides status: Secondary | ICD-10-CM | POA: Diagnosis not present

## 2023-08-11 DIAGNOSIS — I480 Paroxysmal atrial fibrillation: Secondary | ICD-10-CM | POA: Diagnosis present

## 2023-08-11 DIAGNOSIS — I5032 Chronic diastolic (congestive) heart failure: Secondary | ICD-10-CM | POA: Diagnosis not present

## 2023-08-11 DIAGNOSIS — D62 Acute posthemorrhagic anemia: Secondary | ICD-10-CM | POA: Diagnosis not present

## 2023-08-11 DIAGNOSIS — R739 Hyperglycemia, unspecified: Secondary | ICD-10-CM | POA: Diagnosis not present

## 2023-08-11 DIAGNOSIS — C787 Secondary malignant neoplasm of liver and intrahepatic bile duct: Secondary | ICD-10-CM | POA: Diagnosis present

## 2023-08-11 DIAGNOSIS — C7951 Secondary malignant neoplasm of bone: Secondary | ICD-10-CM | POA: Diagnosis present

## 2023-08-11 DIAGNOSIS — Z7951 Long term (current) use of inhaled steroids: Secondary | ICD-10-CM | POA: Diagnosis not present

## 2023-08-11 DIAGNOSIS — F32A Depression, unspecified: Secondary | ICD-10-CM | POA: Diagnosis present

## 2023-08-11 DIAGNOSIS — R110 Nausea: Secondary | ICD-10-CM | POA: Insufficient documentation

## 2023-08-11 DIAGNOSIS — Z7952 Long term (current) use of systemic steroids: Secondary | ICD-10-CM | POA: Diagnosis not present

## 2023-08-11 DIAGNOSIS — R0689 Other abnormalities of breathing: Secondary | ICD-10-CM | POA: Diagnosis not present

## 2023-08-11 DIAGNOSIS — S72142D Displaced intertrochanteric fracture of left femur, subsequent encounter for closed fracture with routine healing: Secondary | ICD-10-CM | POA: Diagnosis not present

## 2023-08-11 DIAGNOSIS — Z90710 Acquired absence of both cervix and uterus: Secondary | ICD-10-CM | POA: Diagnosis not present

## 2023-08-11 DIAGNOSIS — Z5111 Encounter for antineoplastic chemotherapy: Secondary | ICD-10-CM | POA: Insufficient documentation

## 2023-08-11 DIAGNOSIS — Z79899 Other long term (current) drug therapy: Secondary | ICD-10-CM | POA: Diagnosis not present

## 2023-08-11 DIAGNOSIS — S72142A Displaced intertrochanteric fracture of left femur, initial encounter for closed fracture: Secondary | ICD-10-CM | POA: Diagnosis present

## 2023-08-11 DIAGNOSIS — M19011 Primary osteoarthritis, right shoulder: Secondary | ICD-10-CM | POA: Diagnosis not present

## 2023-08-11 DIAGNOSIS — R9089 Other abnormal findings on diagnostic imaging of central nervous system: Secondary | ICD-10-CM | POA: Diagnosis not present

## 2023-08-11 DIAGNOSIS — Z886 Allergy status to analgesic agent status: Secondary | ICD-10-CM | POA: Diagnosis not present

## 2023-08-11 DIAGNOSIS — K5090 Crohn's disease, unspecified, without complications: Secondary | ICD-10-CM | POA: Diagnosis present

## 2023-08-11 DIAGNOSIS — S199XXA Unspecified injury of neck, initial encounter: Secondary | ICD-10-CM | POA: Diagnosis not present

## 2023-08-11 DIAGNOSIS — Z01818 Encounter for other preprocedural examination: Secondary | ICD-10-CM | POA: Diagnosis not present

## 2023-08-11 DIAGNOSIS — M25511 Pain in right shoulder: Secondary | ICD-10-CM | POA: Diagnosis not present

## 2023-08-11 DIAGNOSIS — S0990XA Unspecified injury of head, initial encounter: Secondary | ICD-10-CM | POA: Diagnosis not present

## 2023-08-11 DIAGNOSIS — R160 Hepatomegaly, not elsewhere classified: Secondary | ICD-10-CM

## 2023-08-11 DIAGNOSIS — Z4789 Encounter for other orthopedic aftercare: Secondary | ICD-10-CM | POA: Diagnosis not present

## 2023-08-11 DIAGNOSIS — Z743 Need for continuous supervision: Secondary | ICD-10-CM | POA: Diagnosis not present

## 2023-08-11 DIAGNOSIS — Z853 Personal history of malignant neoplasm of breast: Secondary | ICD-10-CM | POA: Diagnosis not present

## 2023-08-11 DIAGNOSIS — E785 Hyperlipidemia, unspecified: Secondary | ICD-10-CM | POA: Diagnosis present

## 2023-08-11 DIAGNOSIS — Z91041 Radiographic dye allergy status: Secondary | ICD-10-CM | POA: Diagnosis not present

## 2023-08-11 DIAGNOSIS — Z808 Family history of malignant neoplasm of other organs or systems: Secondary | ICD-10-CM | POA: Diagnosis not present

## 2023-08-11 DIAGNOSIS — W1830XA Fall on same level, unspecified, initial encounter: Secondary | ICD-10-CM | POA: Diagnosis present

## 2023-08-11 DIAGNOSIS — W19XXXA Unspecified fall, initial encounter: Secondary | ICD-10-CM | POA: Diagnosis not present

## 2023-08-11 DIAGNOSIS — F419 Anxiety disorder, unspecified: Secondary | ICD-10-CM | POA: Diagnosis present

## 2023-08-11 DIAGNOSIS — Z85828 Personal history of other malignant neoplasm of skin: Secondary | ICD-10-CM | POA: Diagnosis not present

## 2023-08-11 DIAGNOSIS — I10 Essential (primary) hypertension: Secondary | ICD-10-CM | POA: Diagnosis not present

## 2023-08-11 DIAGNOSIS — Z888 Allergy status to other drugs, medicaments and biological substances status: Secondary | ICD-10-CM | POA: Diagnosis not present

## 2023-08-11 DIAGNOSIS — M160 Bilateral primary osteoarthritis of hip: Secondary | ICD-10-CM | POA: Diagnosis not present

## 2023-08-11 MED ORDER — ALPRAZOLAM 0.25 MG PO TABS: 0.2500 mg | ORAL_TABLET | Freq: Two times a day (BID) | ORAL | 0 refills | Status: DC | PRN

## 2023-08-11 MED ORDER — FULVESTRANT 250 MG/5ML IM SOSY
500.0000 mg | PREFILLED_SYRINGE | INTRAMUSCULAR | Status: DC
  Administered 2023-08-11: 500 mg via INTRAMUSCULAR
  Filled 2023-08-11: qty 10

## 2023-08-11 MED ORDER — PROCHLORPERAZINE MALEATE 5 MG PO TABS: 5.0000 mg | ORAL_TABLET | Freq: Four times a day (QID) | ORAL | 2 refills | Status: DC | PRN

## 2023-08-11 NOTE — Assessment & Plan Note (Signed)
 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 Donell Beers. Oncotype RS of 4 (low risk). She received adjuvant radiation. She attempted letrozole previously but did not tolerate due to colitis (pt has Crohn's). -she developed a rash in early 2022. Excisional biopsy by Dr. Arita Miss on 07/25/20 confirmed invasive carcinoma, felt to represent recurrence of breast cancer. She declined further work up with [place] PET scan and was subsequently lost to f/u. -she developed liver and bone mets in 03/2021.  -Due to her colitis, she may not tolerate CDK4/6 inhibitor well (due to potential diarrhea). After a lengthy discussion, she agreed to fulvestrant injections. She received her first dose on 05/15/21. She has tolerated well. -baseline CA 27.29 06/14/21 was elevated at 375.9. This has been steadily decreasing on treatment -restaging CT CAP 02/25/22 showed good treatment response: improvement to liver lesions and bilateral pleural effusions, resolution of LLQ nodule, stable osseous metastatic lesions. No evidence of new metastatic disease. -She is clinically stable, does have fatigue and manageable diarrhea -Restaging CT and bone scan from 01/23/2023 showed overall stable disease  -PET in 04/2023 showed hypermetabolic mediastinal, hilar nodes, and bone lesions.

## 2023-08-11 NOTE — Progress Notes (Signed)
 [facility]   [phone] [phone]   Clinic Follow up Note   Patient Care Team: [name], [name], DO as PCP - General [name], MD as Consulting Physician (Breast Surgery) Pyrtle, Carie Caddy, MD as Consulting Physician (Gastroenterology) [name], MD as Referring Physician (Dermatology) Allena Napoleon, MD as Consulting Physician (Plastic Surgery) Malachy Mood, MD as Consulting Physician (Hematology)  Date of Service:  08/11/2023  CHIEF COMPLAINT: f/u of breast cancer   CURRENT THERAPY:  Fulvestrant injection every 4 weeks  Oncology History   Cancer of lower-inner quadrant of left female breast [facility]) 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 Donell Beers. Oncotype RS of 4 (low risk). She received adjuvant radiation. She attempted letrozole previously but did not tolerate due to colitis (pt has Crohn's). -she developed a rash in early 2022. Excisional biopsy by Dr. Arita Miss on 07/25/20 confirmed invasive carcinoma, felt to represent recurrence of breast cancer. She declined further work up with [place] PET scan and was subsequently lost to f/u. -she developed liver and bone mets in 03/2021.  -Due to her colitis, she may not tolerate CDK4/6 inhibitor well (due to potential diarrhea). After a lengthy discussion, she agreed to fulvestrant injections. She received her first dose on 05/15/21. She has tolerated well. -baseline CA 27.29 06/14/21 was elevated at 375.9. This has been steadily decreasing on treatment -restaging CT CAP 02/25/22 showed good treatment response: improvement to liver lesions and bilateral pleural effusions, resolution of LLQ nodule, stable osseous metastatic lesions. No evidence of new metastatic disease. -She is clinically stable, does have fatigue and manageable diarrhea -Restaging CT and bone scan from 01/23/2023  showed overall stable disease  -PET in 04/2023 showed hypermetabolic mediastinal, hilar nodes, and bone lesions.   Assessment & Plan Metastatic breast cancer Undergoing treatment with current injection therapy. A Guardian 360 liquid biopsy is planned to assess for tumor cell mutations, guiding oral medication options if current therapy becomes ineffective. Insurance covers most of the test cost, with financial assistance available if needed. Oral options include elacestrant for ESR1 mutation and targeted therapies for PIK3CA mutation. Chemotherapy is not preferred due to potential intolerance. PET scan considered based on clinical judgment and financial feasibility. - Perform Guardian 360 liquid biopsy - Administer current injection therapy - Consider oral medication options based on biopsy results - Provide financial assistance information for testing and scans - Monitor tumor markers and consider PET scan if clinically indicated  Nausea Nausea potentially related to metastatic breast cancer or Crohn's disease. Ondansetron causes headaches, Xanax used for nausea and anxiety. Compazine suggested as an alternative, may cause drowsiness but less likely to cause headaches. - Prescribe Compazine for nausea - Refill Xanax prescription - Advise taking Compazine in the afternoon or evening to minimize drowsiness  Crohn's disease Crohn's disease may contribute to nausea. Current management not discussed in detail during this visit.  Plan - Patient is clinically stable, will proceed to fulvestrant injection today and continue every 4 weeks - After explanation, patient agrees to have Guardant360 drawn today - She will return next month for injection - Follow-up in 2 months. - I will send a message to financial advocate, to see if she qualifies for any financial assistance for her co-pays.     SUMMARY OF ONCOLOGIC HISTORY: Oncology History Overview Note  Cancer of lower-inner quadrant of left  female breast (HCC)  Staging form: Breast, AJCC 7th Edition     Clinical stage from 12/06/2014: Stage IA (T1c, N0, M0) - Unsigned     Pathologic stage from 12/13/2014: Stage IA (T1c, N0, cM0) - Unsigned      Cancer of lower-inner quadrant of left female breast (HCC)  11/28/2014 Breast US   Highly suspicious 1.6 cm irregular shadowing mass at the site of palpable concern in the subareolar lower inner quadrant of the left breast at approximately 630.   11/29/2014 Receptors her2   Estrogen Receptor: 100%, POSITIVE, STRONG STAINING INTENSITY (PERFORMED MANUALLY) Progesterone Receptor: 95%, POSITIVE, STRONG STAINING INTENSITY (PERFORMED MANUALLY) Proliferation Marker Ki67: 10% (PERFORMED MANUALLY), Her2neu negative   11/29/2014 Initial Biopsy   Breast, left, needle core biopsy, subareolar about 6:30 - INVASIVE DUCTAL CARCINOMA, SEE COMMENT.   11/30/2014 Initial Diagnosis   Cancer of lower-inner quadrant of left female breast   12/13/2014 Pathology Results   Left breast lumpectomy showed a 1.7 cm invasive ductal carcinoma, grade 1, margins were negative, (+) LVI, 5 Sentinel lymph nodes were negative,   12/13/2014 Oncotype testing   RS 4, which predicts 10-year risk of distant recurrence 4% with tamoxifen alone.   02/05/2015 - 02/26/2015 Radiation Therapy   left breast radiaiton    03/18/2015 -  Anti-estrogen oral therapy   She tried letrozole for 2-3 weeks, could not tolerate and stopped.   07/25/2020 Pathology Results   FINAL MICROSCOPIC DIAGNOSIS: A. SKIN, LEFT CHEST, EXCISION: - Invasive carcinoma involving dermis and subcutaneous soft tissue, consistent with patient's clinical history of invasive ductal carcinoma, see comment - Deep and lateral resection margins are negative for carcinoma  Appears grade I  ADDENDUM: PROGNOSTIC INDICATOR RESULTS: Immunohistochemical and morphometric analysis performed manually The tumor cells are NEGATIVE for Her2 (1+). Estrogen Receptor: POSITIVE,  90%, STRONG STAINING Progesterone Receptor: POSITIVE, 95%, STRONG STAINING Proliferation Marker Ki-67: 5%    04/19/2021 Imaging   EXAM: CT ABDOMEN AND PELVIS WITH CONTRAST  IMPRESSION: 1. Multiple hypodense hepatic masses, as detailed above, highly suspicious for metastatic disease. Differential include infectious/inflammatory process. Further evaluation with MRI examination with contrast would be helpful. 2. Moderate left and small right pleural effusion. 3. Postsurgical changes for prior hemicolectomy. No bowel obstruction, colitis or diverticulitis.   04/24/2021 Pathology Results   FINAL MICROSCOPIC DIAGNOSIS:   A. LIVER, BIOPSY:  -  Metastatic carcinoma  -  See comment   COMMENT:  By immunohistochemistry, the neoplastic cells are positive for  cytokeratin 7, ER (100%, strong), GATA3 and PAX8 (patchy weak) but negative for cytokeratin 20 and TTF-1.  Overall, the morphology and immunophenotype supports metastasis from the patient's known mammary carcinoma.   ADDENDUM:  By immunohistochemistry, HER-2 is EQUIVOCAL (2+).    ADDENDUM:  FLOURESCENCE IN-SITU HYBRIDIZATION RESULTS:  GROUP 5:   HER2 **NEGATIVE**    05/11/2021 Imaging   EXAM: CT CHEST WITH CONTRAST  IMPRESSION: 1. Bilateral pleural effusions and lower lobe compressive atelectasis, left greater than right. 2. Nonspecific right hilar lymphadenopathy. 3. Bony lytic lesions involving the left posterior fifth rib, left T5 transverse process, and left aspect of the T10 vertebral body, consistent with bony metastases. 4. Patchy right upper lobe ground-glass airspace disease, which may be inflammatory or infectious. 5. Slight decrease in size of the left lobe subcapsular liver hematoma. No evidence of active hemorrhage. 6. Stable liver hypodensities consistent with presumed metastatic disease. Please correlate with previous biopsy results.   05/13/2021 Pathology Results   Specimen Submitted:  A. PLEURAL FLUID,  LEFT, THORACENTESIS:  FINAL MICROSCOPIC DIAGNOSIS:  - Atypical mesothelial cells present  - Chronic inflammation    01/23/2023 Imaging   CT chest, abdomen, and pelvis with contrast  IMPRESSION: 1. Stable hepatic and osseous metastatic disease. No evidence of disease progression. 2. Small left pleural effusion, slightly increased. 3.  Aortic atherosclerosis (ICD10-I70.0).      Discussed the use of AI scribe software for clinical note transcription with the patient, who gave verbal consent to proceed.  History of Present Illness The patient, an 83 year old female with a history of metastatic breast cancer and Crohn's disease, presents for a follow-up visit. She expresses concerns about a blood test, the Guardian 360, due to lack of information and potential cost. She also discusses financial difficulties with paying for tests and scans. She has been experiencing nausea, which she has been managing with Xanax. She also mentions occasional hip pain. She requests a refill of her Xanax and a potential increase in her sleep medication.     All other systems were reviewed with the patient and are negative.  MEDICAL HISTORY:  Past Medical History:  Diagnosis Date   Allergy    Anemia    Anxiety    Arthritis    Basal cell carcinoma    Blood transfusion without reported diagnosis    Bowel perforation (HCC)    Cancer of lower-inner quadrant of female breast (HCC) 11/30/2014   left   Cancer of lower-inner quadrant of left female breast (HCC) 11/30/2014   Clostridium difficile infection    Complication of anesthesia    anxiety post-op after ear surgeries   Crohn's disease of large intestine with abscess (HCC)    Dental crowns present    recent root canal 11/2014   Depression    Family history of adverse reaction to anesthesia    pt's sister has hx. of post-op N/V   GERD (gastroesophageal reflux disease)    Hyperlipidemia    Indigestion    Liver carcinoma (HCC) 2023   mets from  breasts   Lung cancer (HCC)    primary-breast   Perianal abscess    Personal history of radiation therapy 2016   PONV (postoperative nausea and vomiting)    Runny nose 12/07/2014   clear drainage, per pt.   Seasonal allergies    TMJ (temporomandibular joint syndrome)    Ulcerative colitis    Vitamin B12 deficiency     SURGICAL HISTORY: Past Surgical History:  Procedure Laterality Date   ABDOMINAL HYSTERECTOMY     partial   APPENDECTOMY     AUGMENTATION MAMMAPLASTY Bilateral    BASAL CELL CARCINOMA EXCISION Left    nose   BREAST BIOPSY Left 2019   benign   BREAST ENHANCEMENT SURGERY     BREAST LUMPECTOMY Left 2016   BREAST LUMPECTOMY WITH RADIOACTIVE SEED AND SENTINEL LYMPH NODE BIOPSY Left 12/13/2014   Procedure: LEFT BREAST LUMPECTOMY WITH RADIOACTIVE SEED AND SENTINEL LYMPH NODE BIOPSY;  Surgeon: [name], MD;  Location: [facility];  Service: General;  Laterality: Left;   COLONOSCOPY WITH PROPOFOL  05/28/2011; 06/28/2014   EXPLORATORY LAPAROTOMY  04/08/2018   with end ileostomy takedown and creation of loop ileostomy-- [facility]Dr Inetta Fermo   FLEXIBLE SIGMOIDOSCOPY N/A 01/08/2016   Procedure: Arnell Sieving;  Surgeon: Ruffin Frederick, MD;  Location: [facility];  Service: Gastroenterology;  Laterality: N/A;  needs MAC due to pain and anxiety   ILEOSTOMY N/A 05/25/2017   Procedure: DILATION OF ILEOSTOMY;  Surgeon: Emelia Loron,  MD;  Location: [facility];  Service: General;  Laterality: N/A;   ILEOSTOMY  02/11/2016   END ileostomy.  Dr Daphine Deutscher   ILEOSTOMY CLOSURE N/A 06/02/2017   Procedure: RESECTION OF ILEAL STRICTURE; ILEOSTOMY REVISION ;  Surgeon: Karie Soda, MD;  Location: [facility];  Service: General;  Laterality: N/A;   ILEOSTOMY DILATION  05/25/2017   DILITATION OF ILEAL STRICTURE   ileostomy stricture resection  06/02/2017   with revision   INCISION AND DRAINAGE ABSCESS  2018   INCISION AND DRAINAGE  PERIRECTAL ABSCESS N/A 09/28/2013   Procedure: IRRIGATION AND DEBRIDEMENT PERIANAL ABSCESS, proctoscopy;  Surgeon: Kandis Cocking, MD;  Location: [facility];  Service: General;  Laterality: N/A;   INCISION AND DRAINAGE PERIRECTAL ABSCESS N/A 01/22/2016   Procedure: IRRIGATION AND DEBRIDEMENT PERIRECTAL ABSCESS;  Surgeon: Darnell Level, MD;  Location: [facility];  Service: General;  Laterality: N/A;   INNER EAR SURGERY Bilateral    LAPAROSCOPIC DIVERTED COLOSTOMY N/A 02/11/2016   Procedure: LAPAROSCOPIC DIVERTED ILEOSTOMY;  Surgeon: Luretha Murphy, MD;  Location: [facility];  Service: General;  Laterality: N/A;   LAPAROSCOPY N/A 03/23/2017   Procedure: LAPAROSCOPY DIAGNOSTIC, EXPLORATORY LAPAROTOMY,  BOWEL RESECTION;  Surgeon: Avel Peace, MD;  Location: [facility];  Service: General;  Laterality: N/A;    I have reviewed the social history and family history with the patient and they are unchanged from previous note.  ALLERGIES:  is allergic to nsaids, codeine, iodinated contrast media, mesalamine, omnipaque [iohexol], oxycodone, lactose intolerance (gi), norco [hydrocodone-acetaminophen], other, and sulfa antibiotics.  MEDICATIONS:  Current Outpatient Medications  Medication Sig Dispense Refill   acetaminophen (TYLENOL) 500 MG tablet You can take 2 tablets every 6 hours as needed for pain.  Use this as your primary pain control.  Use Tramadol as a last resort.  You can buy this over the counter at any drug store.    DO NOT TAKE MORE THAN 4000 MG OF TYLENOL PER DAY.  IT CAN HARM YOUR LIVER. 30 tablet 0   cephALEXin (KEFLEX) 500 MG capsule Take 1 capsule (500 mg total) by mouth 2 (two) times daily. 14 capsule 0   cyanocobalamin (VITAMIN B12) 1000 MCG/ML injection INJECT INTO THE MUSCLE ONCE PER MONTH 3 mL 11   diphenoxylate-atropine (LOMOTIL) 2.5-0.025 MG tablet TAKE 1 TO 2 TABLETS BY MOUTH THREE TO FOUR TIMES DAILY AS NEEDED FOR SEVERE DIARRHEA 240 tablet 3   famotidine (PEPCID) 20 MG tablet Take 1 tablet  (20 mg total) by mouth 2 (two) times daily. 180 tablet 6   FLUoxetine (PROZAC) 20 MG capsule TAKE 1 CAPSULE(20 MG) BY MOUTH DAILY 90 capsule 1   fluticasone (FLONASE) 50 MCG/ACT nasal spray Place 2 sprays into both nostrils daily. (Patient taking differently: Place 2 sprays into both nostrils daily as needed for rhinitis.) 16 g 6   lidocaine-prilocaine (EMLA) cream Apply 1 Application topically as needed (FOR INEJCTION PAIN). 30 g 0   loperamide (IMODIUM) 2 MG capsule Take 1 capsule (2 mg total) by mouth as needed for diarrhea or loose stools. 90 capsule 1   ondansetron (ZOFRAN-ODT) 4 MG disintegrating tablet DISSOLVE ONE TABLET BY MOUTH EVERY 6 HOURS AS NEEDED FOR NAUSEA AND VOMITING 30 tablet 3   predniSONE (DELTASONE) 5 MG tablet TAKE 1 TABLET BY MOUTH EVERY DAY 90 tablet 3   prochlorperazine (COMPAZINE) 5 MG tablet Take 1-2 tablets (5-10 mg total) by mouth every 6 (six) hours as needed for nausea or vomiting. 30 tablet 2   zolpidem (AMBIEN) 10  MG tablet TAKE 1 TABLET(10 MG) BY MOUTH AT BEDTIME AS NEEDED FOR SLEEP 30 tablet 1   ALPRAZolam (XANAX) 0.25 MG tablet Take 1 tablet (0.25 mg total) by mouth 2 (two) times daily as needed for anxiety. 60 tablet 0   No current facility-administered medications for this visit.   Facility-Administered Medications Ordered in Other Visits  Medication Dose Route Frequency Provider Last Rate Last Admin   fulvestrant (FASLODEX) injection 500 mg  500 mg Intramuscular Q30 days Malachy Mood, MD   500 mg at 08/11/23 1411    PHYSICAL EXAMINATION: ECOG PERFORMANCE STATUS: 2 - Symptomatic, <50% confined to bed  Vitals:   08/11/23 1322  BP: 120/70  Pulse: 80  Resp: (!) 22  Temp: 97.9 F (36.6 C)  SpO2: 97%   Wt Readings from Last 3 Encounters:  08/11/23 158 lb 3.2 oz (71.8 kg)  07/16/23 160 lb 6.4 oz (72.8 kg)  05/18/23 160 lb 1.6 oz (72.6 kg)     GENERAL:alert, no distress and comfortable SKIN: skin color, texture, turgor are normal, no rashes or  significant lesions EYES: normal, Conjunctiva are pink and non-injected, sclera clear NECK: supple, thyroid normal size, non-tender, without nodularity LYMPH:  no palpable lymphadenopathy in the cervical, axillary  LUNGS: clear to auscultation and percussion with normal breathing effort HEART: regular rate & rhythm and no murmurs and no lower extremity edema ABDOMEN:abdomen soft, non-tender and normal bowel sounds Musculoskeletal:no cyanosis of digits and no clubbing  NEURO: alert & oriented x 3 with fluent speech, no focal motor/sensory deficits  Physical Exam    LABORATORY DATA:  I have reviewed the data as listed    Latest Ref Rng & Units 07/16/2023   11:19 AM 05/18/2023   10:36 AM 04/07/2023   12:38 PM  CBC  WBC 4.0 - 10.5 K/uL 8.6  7.4  7.6   Hemoglobin 12.0 - 15.0 g/dL 52.8  41.3  24.4   Hematocrit 36.0 - 46.0 % 42.3  44.0  44.9   Platelets 150 - 400 K/uL 209  225  245         Latest Ref Rng & Units 07/16/2023   11:19 AM 05/18/2023   10:36 AM 04/07/2023   12:38 PM  CMP  Glucose 70 - 99 mg/dL 97  010  272   BUN 8 - 23 mg/dL 9  13  17    Creatinine 0.44 - 1.00 mg/dL 5.36  6.44  0.34   Sodium 135 - 145 mmol/L 133  133  133   Potassium 3.5 - 5.1 mmol/L 3.8  3.6  4.2   Chloride 98 - 111 mmol/L 102  101  99   CO2 22 - 32 mmol/L 25  26  27    Calcium 8.9 - 10.3 mg/dL 8.7  9.2  9.6   Total Protein 6.5 - 8.1 g/dL 6.4  6.6  7.0   Total Bilirubin 0.0 - 1.2 mg/dL 0.5  0.6  0.6   Alkaline Phos 38 - 126 U/L 72  69  72   AST 15 - 41 U/L 14  15  15    ALT 0 - 44 U/L 8  7  10        RADIOGRAPHIC STUDIES: I have personally reviewed the radiological images as listed and agreed with the findings in the report. No results found.    No orders of the defined types were placed in this encounter.  All questions were answered. The patient knows to call the clinic with any problems, questions or  concerns. No barriers to learning was detected. The total time spent in the appointment was  30 minutes.     Sonja [place], MD 08/11/2023

## 2023-08-13 ENCOUNTER — Inpatient Hospital Stay: Admitting: Licensed Clinical Social Worker

## 2023-08-13 DIAGNOSIS — C50312 Malignant neoplasm of lower-inner quadrant of left female breast: Secondary | ICD-10-CM

## 2023-08-13 DIAGNOSIS — Z170 Estrogen receptor positive status [ER+]: Secondary | ICD-10-CM

## 2023-08-13 LAB — GUARDANT 360

## 2023-08-13 NOTE — Progress Notes (Signed)
 [facility] CSW Progress Note  Visual merchandiser  spoke w/ both pt and her sister, [name], regarding financial resources.  Referral sent to [facility] on behalf of pt and links for the [employer] and [employer] to pt's sister.  CSW to remain available as appropriate to provide support throughout duration of treatment.        Quenton Bruns, LCSW Clinical Social Worker [facility]

## 2023-08-14 ENCOUNTER — Encounter (HOSPITAL_COMMUNITY)
Admission: EM | Disposition: A | Discharge status: Skilled Nursing Facility | Payer: Self-pay | Source: Home / Self Care | Attending: Family Medicine

## 2023-08-14 ENCOUNTER — Inpatient Hospital Stay (HOSPITAL_COMMUNITY)
Admission: EM | Admit: 2023-08-14 | Discharge: 2023-08-25 | DRG: 481 | Disposition: A | Discharge status: Skilled Nursing Facility | Attending: Family Medicine | Admitting: Family Medicine

## 2023-08-14 ENCOUNTER — Other Ambulatory Visit: Payer: Self-pay

## 2023-08-14 ENCOUNTER — Inpatient Hospital Stay (HOSPITAL_COMMUNITY)

## 2023-08-14 ENCOUNTER — Encounter (HOSPITAL_COMMUNITY): Payer: Self-pay

## 2023-08-14 ENCOUNTER — Emergency Department (HOSPITAL_COMMUNITY)

## 2023-08-14 DIAGNOSIS — Z923 Personal history of irradiation: Secondary | ICD-10-CM

## 2023-08-14 DIAGNOSIS — Z79899 Other long term (current) drug therapy: Secondary | ICD-10-CM | POA: Diagnosis not present

## 2023-08-14 DIAGNOSIS — S72142A Displaced intertrochanteric fracture of left femur, initial encounter for closed fracture: Secondary | ICD-10-CM

## 2023-08-14 DIAGNOSIS — D62 Acute posthemorrhagic anemia: Secondary | ICD-10-CM | POA: Diagnosis not present

## 2023-08-14 DIAGNOSIS — F32A Depression, unspecified: Secondary | ICD-10-CM | POA: Diagnosis present

## 2023-08-14 DIAGNOSIS — S0003XA Contusion of scalp, initial encounter: Secondary | ICD-10-CM | POA: Diagnosis present

## 2023-08-14 DIAGNOSIS — K5090 Crohn's disease, unspecified, without complications: Secondary | ICD-10-CM | POA: Diagnosis present

## 2023-08-14 DIAGNOSIS — Z91011 Allergy to milk products: Secondary | ICD-10-CM | POA: Diagnosis not present

## 2023-08-14 DIAGNOSIS — Z85828 Personal history of other malignant neoplasm of skin: Secondary | ICD-10-CM

## 2023-08-14 DIAGNOSIS — C7951 Secondary malignant neoplasm of bone: Secondary | ICD-10-CM | POA: Diagnosis present

## 2023-08-14 DIAGNOSIS — E785 Hyperlipidemia, unspecified: Secondary | ICD-10-CM | POA: Diagnosis present

## 2023-08-14 DIAGNOSIS — F418 Other specified anxiety disorders: Secondary | ICD-10-CM | POA: Diagnosis not present

## 2023-08-14 DIAGNOSIS — F419 Anxiety disorder, unspecified: Secondary | ICD-10-CM | POA: Diagnosis present

## 2023-08-14 DIAGNOSIS — Z853 Personal history of malignant neoplasm of breast: Secondary | ICD-10-CM | POA: Diagnosis not present

## 2023-08-14 DIAGNOSIS — Z886 Allergy status to analgesic agent status: Secondary | ICD-10-CM | POA: Diagnosis not present

## 2023-08-14 DIAGNOSIS — Z7952 Long term (current) use of systemic steroids: Secondary | ICD-10-CM | POA: Diagnosis not present

## 2023-08-14 DIAGNOSIS — S72002A Fracture of unspecified part of neck of left femur, initial encounter for closed fracture: Secondary | ICD-10-CM | POA: Diagnosis present

## 2023-08-14 DIAGNOSIS — Z91041 Radiographic dye allergy status: Secondary | ICD-10-CM | POA: Diagnosis not present

## 2023-08-14 DIAGNOSIS — C787 Secondary malignant neoplasm of liver and intrahepatic bile duct: Secondary | ICD-10-CM | POA: Diagnosis present

## 2023-08-14 DIAGNOSIS — R739 Hyperglycemia, unspecified: Secondary | ICD-10-CM

## 2023-08-14 DIAGNOSIS — Z885 Allergy status to narcotic agent status: Secondary | ICD-10-CM | POA: Diagnosis not present

## 2023-08-14 DIAGNOSIS — Z808 Family history of malignant neoplasm of other organs or systems: Secondary | ICD-10-CM | POA: Diagnosis not present

## 2023-08-14 DIAGNOSIS — Z888 Allergy status to other drugs, medicaments and biological substances status: Secondary | ICD-10-CM | POA: Diagnosis not present

## 2023-08-14 DIAGNOSIS — Z7951 Long term (current) use of inhaled steroids: Secondary | ICD-10-CM | POA: Diagnosis not present

## 2023-08-14 DIAGNOSIS — Z90710 Acquired absence of both cervix and uterus: Secondary | ICD-10-CM | POA: Diagnosis not present

## 2023-08-14 DIAGNOSIS — C50919 Malignant neoplasm of unspecified site of unspecified female breast: Secondary | ICD-10-CM | POA: Diagnosis not present

## 2023-08-14 DIAGNOSIS — W1830XA Fall on same level, unspecified, initial encounter: Secondary | ICD-10-CM | POA: Diagnosis present

## 2023-08-14 DIAGNOSIS — C50312 Malignant neoplasm of lower-inner quadrant of left female breast: Secondary | ICD-10-CM

## 2023-08-14 DIAGNOSIS — Z882 Allergy status to sulfonamides status: Secondary | ICD-10-CM | POA: Diagnosis not present

## 2023-08-14 DIAGNOSIS — G4700 Insomnia, unspecified: Secondary | ICD-10-CM

## 2023-08-14 DIAGNOSIS — I480 Paroxysmal atrial fibrillation: Secondary | ICD-10-CM | POA: Diagnosis present

## 2023-08-14 HISTORY — PX: INTRAMEDULLARY (IM) NAIL INTERTROCHANTERIC: SHX5875

## 2023-08-14 LAB — BASIC METABOLIC PANEL WITH GFR
Anion gap: 12 (ref 5–15)
BUN: 12 mg/dL (ref 8–23)
CO2: 19 mmol/L — ABNORMAL LOW (ref 22–32)
Calcium: 9.2 mg/dL (ref 8.9–10.3)
Chloride: 105 mmol/L (ref 98–111)
Creatinine, Ser: 0.91 mg/dL (ref 0.44–1.00)
GFR, Estimated: >60 mL/min (ref >60)
Glucose, Bld: 116 mg/dL — ABNORMAL HIGH (ref 70–99)
Potassium: 3.7 mmol/L (ref 3.5–5.1)
Sodium: 136 mmol/L (ref 135–145)

## 2023-08-14 LAB — TYPE AND SCREEN
ABO/RH(D): A NEG
Antibody Screen: NEGATIVE

## 2023-08-14 LAB — CBC WITH DIFFERENTIAL/PLATELET
Abs Immature Granulocytes: 0.1 10*3/uL — ABNORMAL HIGH (ref 0.00–0.07)
Basophils Absolute: 0.1 10*3/uL (ref 0.0–0.1)
Basophils Relative: 0 %
Eosinophils Absolute: 0 10*3/uL (ref 0.0–0.5)
Eosinophils Relative: 0 %
HCT: 41.5 % (ref 36.0–46.0)
Hemoglobin: 14.3 g/dL (ref 12.0–15.0)
Immature Granulocytes: 1 %
Lymphocytes Relative: 9 %
Lymphs Abs: 1.7 10*3/uL (ref 0.7–4.0)
MCH: 32.2 pg (ref 26.0–34.0)
MCHC: 34.5 g/dL (ref 30.0–36.0)
MCV: 93.5 fL (ref 80.0–100.0)
Monocytes Absolute: 1.3 10*3/uL — ABNORMAL HIGH (ref 0.1–1.0)
Monocytes Relative: 6 %
Neutro Abs: 17.2 10*3/uL — ABNORMAL HIGH (ref 1.7–7.7)
Neutrophils Relative %: 84 %
Platelets: 247 10*3/uL (ref 150–400)
RBC: 4.44 MIL/uL (ref 3.87–5.11)
RDW: 12.1 % (ref 11.5–15.5)
WBC: 20.4 10*3/uL — ABNORMAL HIGH (ref 4.0–10.5)
nRBC: 0 % (ref 0.0–0.2)

## 2023-08-14 LAB — CBC
HCT: 39.3 % (ref 36.0–46.0)
Hemoglobin: 13.1 g/dL (ref 12.0–15.0)
MCH: 31.9 pg (ref 26.0–34.0)
MCHC: 33.3 g/dL (ref 30.0–36.0)
MCV: 95.6 fL (ref 80.0–100.0)
Platelets: 213 10*3/uL (ref 150–400)
RBC: 4.11 MIL/uL (ref 3.87–5.11)
RDW: 12.4 % (ref 11.5–15.5)
WBC: 15.1 10*3/uL — ABNORMAL HIGH (ref 4.0–10.5)
nRBC: 0 % (ref 0.0–0.2)

## 2023-08-14 LAB — VITAMIN D 25 HYDROXY (VIT D DEFICIENCY, FRACTURES): Vit D, 25-Hydroxy: 22.18 ng/mL — ABNORMAL LOW (ref 30–100)

## 2023-08-14 LAB — CREATININE, SERUM
Creatinine, Ser: 1.14 mg/dL — ABNORMAL HIGH (ref 0.44–1.00)
GFR, Estimated: 48 mL/min — ABNORMAL LOW (ref >60)

## 2023-08-14 LAB — PROTIME-INR
INR: 1 (ref 0.8–1.2)
Prothrombin Time: 13.6 s (ref 11.4–15.2)

## 2023-08-14 LAB — MAGNESIUM: Magnesium: 1.8 mg/dL (ref 1.7–2.4)

## 2023-08-14 LAB — GLUCOSE, CAPILLARY: Glucose-Capillary: 168 mg/dL — ABNORMAL HIGH (ref 70–99)

## 2023-08-14 SURGERY — FIXATION, FRACTURE, INTERTROCHANTERIC, WITH INTRAMEDULLARY ROD
Anesthesia: General | Laterality: Left

## 2023-08-14 MED ORDER — SODIUM CHLORIDE 0.9% FLUSH
3.0000 mL | Freq: Two times a day (BID) | INTRAVENOUS | Status: DC
  Administered 2023-08-14: 5 mL via INTRAVENOUS
  Administered 2023-08-15: 3 mL via INTRAVENOUS
  Administered 2023-08-15 – 2023-08-17 (×4): 10 mL via INTRAVENOUS
  Administered 2023-08-17: 3 mL via INTRAVENOUS
  Administered 2023-08-18 (×2): 10 mL via INTRAVENOUS
  Administered 2023-08-19: 5 mL via INTRAVENOUS
  Administered 2023-08-20: 3 mL via INTRAVENOUS
  Administered 2023-08-20: 10 mL via INTRAVENOUS
  Administered 2023-08-20: 3 mL via INTRAVENOUS
  Administered 2023-08-21: 10 mL via INTRAVENOUS
  Administered 2023-08-21: 3 mL via INTRAVENOUS
  Administered 2023-08-22 (×2): 10 mL via INTRAVENOUS
  Administered 2023-08-23: 3 mL via INTRAVENOUS
  Administered 2023-08-23 – 2023-08-24 (×2): 10 mL via INTRAVENOUS

## 2023-08-14 MED ORDER — ACETAMINOPHEN 325 MG PO TABS: 650.0000 mg | ORAL_TABLET | Freq: Four times a day (QID) | ORAL | Status: DC | PRN

## 2023-08-14 MED ORDER — METOCLOPRAMIDE HCL 5 MG/ML IJ SOLN: 5.0000 mg | Freq: Three times a day (TID) | INTRAMUSCULAR | Status: DC | PRN

## 2023-08-14 MED ORDER — ONDANSETRON HCL 4 MG PO TABS
4.0000 mg | ORAL_TABLET | Freq: Four times a day (QID) | ORAL | Status: DC | PRN
Start: 2023-08-14 — End: 2023-08-25
  Administered 2023-08-19 – 2023-08-25 (×2): 4 mg via ORAL
  Filled 2023-08-14 (×2): qty 1

## 2023-08-14 MED ORDER — LIDOCAINE 2% (20 MG/ML) 5 ML SYRINGE
INTRAMUSCULAR | Status: AC
  Filled 2023-08-14: qty 5

## 2023-08-14 MED ORDER — PHENYLEPHRINE 80 MCG/ML (10ML) SYRINGE FOR IV PUSH (FOR BLOOD PRESSURE SUPPORT)
PREFILLED_SYRINGE | INTRAVENOUS | Status: DC | PRN
  Administered 2023-08-14: 80 ug via INTRAVENOUS

## 2023-08-14 MED ORDER — HYDROMORPHONE HCL 1 MG/ML IJ SOLN
0.5000 mg | INTRAMUSCULAR | Status: DC | PRN
  Administered 2023-08-15 – 2023-08-18 (×7): 1 mg via INTRAVENOUS
  Filled 2023-08-14 (×8): qty 1

## 2023-08-14 MED ORDER — ONDANSETRON HCL 4 MG/2ML IJ SOLN
4.0000 mg | Freq: Once | INTRAMUSCULAR | Status: AC
  Administered 2023-08-14: 4 mg via INTRAVENOUS
  Filled 2023-08-14: qty 2

## 2023-08-14 MED ORDER — HYDROMORPHONE HCL 1 MG/ML IJ SOLN
0.5000 mg | INTRAMUSCULAR | Status: DC | PRN
  Administered 2023-08-14 (×3): 0.5 mg via INTRAVENOUS
  Filled 2023-08-14 (×3): qty 1

## 2023-08-14 MED ORDER — ONDANSETRON HCL 4 MG/2ML IJ SOLN
4.0000 mg | Freq: Four times a day (QID) | INTRAMUSCULAR | Status: DC | PRN
Start: 2023-08-14 — End: 2023-08-25
  Administered 2023-08-21 – 2023-08-24 (×4): 4 mg via INTRAVENOUS
  Filled 2023-08-14 (×4): qty 2

## 2023-08-14 MED ORDER — CEFAZOLIN SODIUM-DEXTROSE 2-4 GM/100ML-% IV SOLN
2.0000 g | Freq: Three times a day (TID) | INTRAVENOUS | Status: AC
  Administered 2023-08-14 – 2023-08-15 (×3): 2 g via INTRAVENOUS
  Filled 2023-08-14 (×3): qty 100

## 2023-08-14 MED ORDER — VANCOMYCIN HCL 1000 MG IV SOLR
INTRAVENOUS | Status: AC
  Filled 2023-08-14: qty 20

## 2023-08-14 MED ORDER — ENOXAPARIN SODIUM 40 MG/0.4ML IJ SOSY
40.0000 mg | PREFILLED_SYRINGE | INTRAMUSCULAR | Status: DC
  Administered 2023-08-15 – 2023-08-19 (×5): 40 mg via SUBCUTANEOUS
  Filled 2023-08-14 (×5): qty 0.4

## 2023-08-14 MED ORDER — CHLORHEXIDINE GLUCONATE 0.12 % MT SOLN
OROMUCOSAL | Status: AC
Start: 2023-08-14 — End: 2023-08-14
  Filled 2023-08-14: qty 15

## 2023-08-14 MED ORDER — ONDANSETRON HCL 4 MG/2ML IJ SOLN: 4.0000 mg | Freq: Four times a day (QID) | INTRAMUSCULAR | Status: DC | PRN

## 2023-08-14 MED ORDER — TRAMADOL HCL 50 MG PO TABS
50.0000 mg | ORAL_TABLET | Freq: Four times a day (QID) | ORAL | Status: DC | PRN
  Administered 2023-08-14: 50 mg via ORAL
  Administered 2023-08-15 – 2023-08-18 (×5): 100 mg via ORAL
  Filled 2023-08-14: qty 2
  Filled 2023-08-14: qty 1
  Filled 2023-08-14 (×6): qty 2

## 2023-08-14 MED ORDER — POLYETHYLENE GLYCOL 3350 17 G PO PACK: 17.0000 g | PACK | Freq: Every day | ORAL | Status: DC | PRN

## 2023-08-14 MED ORDER — FENTANYL CITRATE (PF) 250 MCG/5ML IJ SOLN
INTRAMUSCULAR | Status: AC
Start: 2023-08-14 — End: ?
  Filled 2023-08-14: qty 5

## 2023-08-14 MED ORDER — SUGAMMADEX SODIUM 200 MG/2ML IV SOLN
INTRAVENOUS | Status: DC | PRN
  Administered 2023-08-14: 200 mg via INTRAVENOUS

## 2023-08-14 MED ORDER — ONDANSETRON HCL 4 MG/2ML IJ SOLN: 4.0000 mg | Freq: Once | INTRAMUSCULAR | Status: DC | PRN

## 2023-08-14 MED ORDER — METHOCARBAMOL 500 MG PO TABS
500.0000 mg | ORAL_TABLET | Freq: Four times a day (QID) | ORAL | Status: DC | PRN
  Administered 2023-08-14 – 2023-08-25 (×23): 500 mg via ORAL
  Filled 2023-08-14 (×25): qty 1

## 2023-08-14 MED ORDER — ONDANSETRON HCL 4 MG/2ML IJ SOLN
INTRAMUSCULAR | Status: AC
  Filled 2023-08-14: qty 2

## 2023-08-14 MED ORDER — FENTANYL CITRATE (PF) 100 MCG/2ML IJ SOLN
25.0000 ug | INTRAMUSCULAR | Status: DC | PRN
  Administered 2023-08-14 (×4): 25 ug via INTRAVENOUS

## 2023-08-14 MED ORDER — MORPHINE SULFATE (PF) 4 MG/ML IV SOLN
4.0000 mg | INTRAVENOUS | Status: DC | PRN
  Administered 2023-08-14 (×3): 4 mg via INTRAVENOUS
  Filled 2023-08-14 (×3): qty 1

## 2023-08-14 MED ORDER — SODIUM CHLORIDE 0.9% FLUSH: 3.0000 mL | INTRAVENOUS | Status: DC | PRN

## 2023-08-14 MED ORDER — 0.9 % SODIUM CHLORIDE (POUR BTL) OPTIME
TOPICAL | Status: DC | PRN
  Administered 2023-08-14: 1000 mL

## 2023-08-14 MED ORDER — TRANEXAMIC ACID-NACL 1000-0.7 MG/100ML-% IV SOLN
1000.0000 mg | Freq: Once | INTRAVENOUS | Status: AC
  Administered 2023-08-14: 1000 mg via INTRAVENOUS
  Filled 2023-08-14: qty 100

## 2023-08-14 MED ORDER — ACETAMINOPHEN 325 MG PO TABS
650.0000 mg | ORAL_TABLET | Freq: Four times a day (QID) | ORAL | Status: DC
Start: 2023-08-14 — End: 2023-08-25
  Administered 2023-08-14 – 2023-08-25 (×19): 650 mg via ORAL
  Filled 2023-08-14 (×25): qty 2

## 2023-08-14 MED ORDER — ROCURONIUM BROMIDE 10 MG/ML (PF) SYRINGE
PREFILLED_SYRINGE | INTRAVENOUS | Status: AC
  Filled 2023-08-14: qty 10

## 2023-08-14 MED ORDER — FENTANYL CITRATE (PF) 250 MCG/5ML IJ SOLN
INTRAMUSCULAR | Status: DC | PRN
  Administered 2023-08-14 (×2): 50 ug via INTRAVENOUS

## 2023-08-14 MED ORDER — FLUOXETINE HCL 20 MG PO CAPS
20.0000 mg | ORAL_CAPSULE | Freq: Every day | ORAL | Status: DC
  Administered 2023-08-15 – 2023-08-25 (×11): 20 mg via ORAL
  Filled 2023-08-14 (×12): qty 1

## 2023-08-14 MED ORDER — PROPOFOL 10 MG/ML IV BOLUS
INTRAVENOUS | Status: AC
  Filled 2023-08-14: qty 20

## 2023-08-14 MED ORDER — METHOCARBAMOL 1000 MG/10ML IJ SOLN: 500.0000 mg | Freq: Four times a day (QID) | INTRAMUSCULAR | Status: DC | PRN

## 2023-08-14 MED ORDER — LIDOCAINE 2% (20 MG/ML) 5 ML SYRINGE
INTRAMUSCULAR | Status: DC | PRN
  Administered 2023-08-14: 60 mg via INTRAVENOUS

## 2023-08-14 MED ORDER — PHENYLEPHRINE 80 MCG/ML (10ML) SYRINGE FOR IV PUSH (FOR BLOOD PRESSURE SUPPORT)
PREFILLED_SYRINGE | INTRAVENOUS | Status: AC
  Filled 2023-08-14: qty 10

## 2023-08-14 MED ORDER — VITAMIN D 25 MCG (1000 UNIT) PO TABS
1000.0000 [IU] | ORAL_TABLET | Freq: Every day | ORAL | Status: DC
  Administered 2023-08-15 – 2023-08-25 (×11): 1000 [IU] via ORAL
  Filled 2023-08-14 (×12): qty 1

## 2023-08-14 MED ORDER — CEFAZOLIN SODIUM-DEXTROSE 1-4 GM/50ML-% IV SOLN
INTRAVENOUS | Status: DC | PRN
  Administered 2023-08-14: 2 g via INTRAVENOUS

## 2023-08-14 MED ORDER — DOCUSATE SODIUM 100 MG PO CAPS
100.0000 mg | ORAL_CAPSULE | Freq: Two times a day (BID) | ORAL | Status: DC
Start: 2023-08-14 — End: 2023-08-19
  Administered 2023-08-15 – 2023-08-18 (×4): 100 mg via ORAL
  Filled 2023-08-14 (×9): qty 1

## 2023-08-14 MED ORDER — FENTANYL CITRATE (PF) 100 MCG/2ML IJ SOLN
INTRAMUSCULAR | Status: AC
  Filled 2023-08-14: qty 2

## 2023-08-14 MED ORDER — PHENYLEPHRINE HCL-NACL 20-0.9 MG/250ML-% IV SOLN
INTRAVENOUS | Status: DC | PRN
  Administered 2023-08-14: 50 ug/min via INTRAVENOUS

## 2023-08-14 MED ORDER — LACTATED RINGERS IV SOLN: INTRAVENOUS | Status: DC | PRN

## 2023-08-14 MED ORDER — PROPOFOL 10 MG/ML IV BOLUS
INTRAVENOUS | Status: DC | PRN
  Administered 2023-08-14: 100 mg via INTRAVENOUS
  Administered 2023-08-14: 75 ug/kg/min via INTRAVENOUS

## 2023-08-14 MED ORDER — LORAZEPAM 2 MG/ML IJ SOLN
0.5000 mg | Freq: Four times a day (QID) | INTRAMUSCULAR | Status: DC | PRN
  Administered 2023-08-14: 0.5 mg via INTRAVENOUS
  Filled 2023-08-14: qty 1

## 2023-08-14 MED ORDER — ROCURONIUM BROMIDE 10 MG/ML (PF) SYRINGE
PREFILLED_SYRINGE | INTRAVENOUS | Status: DC | PRN
  Administered 2023-08-14: 50 mg via INTRAVENOUS

## 2023-08-14 MED ORDER — METOCLOPRAMIDE HCL 5 MG PO TABS: 5.0000 mg | ORAL_TABLET | Freq: Three times a day (TID) | ORAL | Status: DC | PRN

## 2023-08-14 MED ORDER — CEFAZOLIN SODIUM-DEXTROSE 2-4 GM/100ML-% IV SOLN
INTRAVENOUS | Status: AC
  Filled 2023-08-14: qty 100

## 2023-08-14 MED ORDER — DEXAMETHASONE SODIUM PHOSPHATE 10 MG/ML IJ SOLN
INTRAMUSCULAR | Status: AC
  Filled 2023-08-14: qty 1

## 2023-08-14 MED ORDER — DEXAMETHASONE SODIUM PHOSPHATE 10 MG/ML IJ SOLN
INTRAMUSCULAR | Status: DC | PRN
  Administered 2023-08-14: 10 mg via INTRAVENOUS

## 2023-08-14 MED ORDER — FENTANYL CITRATE PF 50 MCG/ML IJ SOSY: 25.0000 ug | PREFILLED_SYRINGE | INTRAMUSCULAR | Status: DC | PRN

## 2023-08-14 MED ORDER — ACETAMINOPHEN 650 MG RE SUPP
650.0000 mg | Freq: Four times a day (QID) | RECTAL | Status: DC
  Filled 2023-08-14: qty 1

## 2023-08-14 MED ORDER — ACETAMINOPHEN 650 MG RE SUPP: 650.0000 mg | Freq: Four times a day (QID) | RECTAL | Status: DC | PRN

## 2023-08-14 MED ORDER — ONDANSETRON HCL 4 MG/2ML IJ SOLN
INTRAMUSCULAR | Status: DC | PRN
  Administered 2023-08-14: 4 mg via INTRAVENOUS

## 2023-08-14 MED ORDER — LOPERAMIDE HCL 2 MG PO CAPS
2.0000 mg | ORAL_CAPSULE | ORAL | Status: DC | PRN
  Administered 2023-08-15: 2 mg via ORAL
  Filled 2023-08-14: qty 1

## 2023-08-14 MED ORDER — PREDNISONE 5 MG PO TABS
5.0000 mg | ORAL_TABLET | Freq: Every day | ORAL | Status: DC
Start: 2023-08-14 — End: 2023-08-25
  Administered 2023-08-15 – 2023-08-25 (×11): 5 mg via ORAL
  Filled 2023-08-14 (×12): qty 1

## 2023-08-14 MED ORDER — ZOLPIDEM TARTRATE 5 MG PO TABS
10.0000 mg | ORAL_TABLET | Freq: Every evening | ORAL | Status: DC | PRN
  Administered 2023-08-14 – 2023-08-24 (×11): 10 mg via ORAL
  Filled 2023-08-14 (×11): qty 2

## 2023-08-14 MED ORDER — HYDROMORPHONE HCL 1 MG/ML IJ SOLN
0.5000 mg | INTRAMUSCULAR | Status: DC | PRN
  Administered 2023-08-14: 1 mg via INTRAVENOUS
  Filled 2023-08-14: qty 1

## 2023-08-14 SURGICAL SUPPLY — 43 items
BAG COUNTER SPONGE SURGICOUNT (BAG) IMPLANT
BIT DRILL INTERTAN LAG SCREW (BIT) IMPLANT
BIT DRILL SHORT 4.0 (BIT) IMPLANT
BRUSH SCRUB EZ PLAIN DRY (MISCELLANEOUS) ×2 IMPLANT
CHLORAPREP W/TINT 26 (MISCELLANEOUS) ×1 IMPLANT
COVER PERINEAL POST (MISCELLANEOUS) ×1 IMPLANT
COVER SURGICAL LIGHT HANDLE (MISCELLANEOUS) ×1 IMPLANT
DERMABOND ADVANCED .7 DNX12 (GAUZE/BANDAGES/DRESSINGS) ×1 IMPLANT
DRAPE C-ARM 35X43 STRL (DRAPES) ×1 IMPLANT
DRAPE IMP U-DRAPE 54X76 (DRAPES) ×2 IMPLANT
DRAPE INCISE IOBAN 66X45 STRL (DRAPES) ×1 IMPLANT
DRAPE STERI IOBAN 125X83 (DRAPES) ×1 IMPLANT
DRAPE SURG 17X23 STRL (DRAPES) ×2 IMPLANT
DRAPE U-SHAPE 47X51 STRL (DRAPES) ×1 IMPLANT
DRESSING MEPILEX FLEX 4X4 (GAUZE/BANDAGES/DRESSINGS) ×1 IMPLANT
DRSG MEPILEX FLEX 4X4 (GAUZE/BANDAGES/DRESSINGS) ×3 IMPLANT
DRSG MEPILEX POST OP 4X8 (GAUZE/BANDAGES/DRESSINGS) ×1 IMPLANT
ELECT REM PT RETURN 9FT ADLT (ELECTROSURGICAL) ×1 IMPLANT
ELECTRODE REM PT RTRN 9FT ADLT (ELECTROSURGICAL) ×1 IMPLANT
GLOVE BIO SURGEON STRL SZ 6.5 (GLOVE) ×3 IMPLANT
GLOVE BIO SURGEON STRL SZ7.5 (GLOVE) ×4 IMPLANT
GLOVE BIOGEL PI IND STRL 6.5 (GLOVE) ×1 IMPLANT
GLOVE BIOGEL PI IND STRL 7.5 (GLOVE) ×1 IMPLANT
GOWN STRL REUS W/ TWL LRG LVL3 (GOWN DISPOSABLE) ×1 IMPLANT
GUIDE PIN 3.2X343 (PIN) ×2 IMPLANT
GUIDE ROD 3.0 (MISCELLANEOUS) ×1 IMPLANT
KIT BASIN OR (CUSTOM PROCEDURE TRAY) ×1 IMPLANT
KIT TURNOVER KIT B (KITS) ×1 IMPLANT
MANIFOLD NEPTUNE II (INSTRUMENTS) ×1 IMPLANT
NAIL LOCK CANN 10X360 130D LT (Nail) IMPLANT
NS IRRIG 1000ML POUR BTL (IV SOLUTION) ×1 IMPLANT
PACK GENERAL/GYN (CUSTOM PROCEDURE TRAY) ×1 IMPLANT
PAD ARMBOARD POSITIONER FOAM (MISCELLANEOUS) ×2 IMPLANT
PIN GUIDE 3.2X343MM (PIN) IMPLANT
ROD GUIDE 3.0 (MISCELLANEOUS) IMPLANT
SCREW LAG COMPR KIT 100/95 (Screw) IMPLANT
SCREW TRIGEN LOW PROF 5.0X40 (Screw) IMPLANT
SUT MNCRL AB 3-0 PS2 18 (SUTURE) ×1 IMPLANT
SUT MON AB 2-0 CT1 36 (SUTURE) IMPLANT
SUT VIC AB 0 CT1 27XBRD ANBCTR (SUTURE) IMPLANT
SUT VIC AB 2-0 CT1 TAPERPNT 27 (SUTURE) ×2 IMPLANT
TOWEL GREEN STERILE (TOWEL DISPOSABLE) ×2 IMPLANT
WATER STERILE IRR 1000ML POUR (IV SOLUTION) ×1 IMPLANT

## 2023-08-14 NOTE — H&P (View-Only) (Signed)
 Orthopaedic Trauma Service (OTS) Consult   Patient ID: Sara Barnes MRN: [id number] DOB/AGE: [birth year] 83 y.o.  Reason for Consult:Left intertrochanteric femur fracture Referring Physician: Dr. Selinda Gosling, MD [facility]  HPI: Sara Barnes is an 83 y.o. female who is being seen in consultation at the request of Dr. Gosling for evaluation of left intertrochanteric femur fracture.  The patient had a ground-level fall sustaining above injury.  She presents to the emergency room where x-rays showed a left intertrochanteric/subtrochanteric femur fracture.  Due to OR availability I was asked to take over and manage her primarily.  Patient was seen and evaluated in the preoperative holding area.  Currently with her sister and son.  She is not having any pain other than her left leg.  She does have atrial fibrillation but is not on any anticoagulation.  She lives at home alone and ambulates without assist device.  She states that she is very active at baseline.  She does have a history of breast cancer for which she is being followed by oncology.  Denies any significant tobacco use.  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   Seasonal allergies    TMJ (temporomandibular joint syndrome)    Ulcerative colitis    Vitamin B12 deficiency     Past Surgical History:  Procedure Laterality Date   ABDOMINAL HYSTERECTOMY     partial   APPENDECTOMY     AUGMENTATION MAMMAPLASTY Bilateral    BASAL CELL CARCINOMA EXCISION Left    nose   BREAST BIOPSY Left 2019   benign   BREAST ENHANCEMENT SURGERY     BREAST LUMPECTOMY Left 2016   BREAST LUMPECTOMY WITH RADIOACTIVE SEED AND SENTINEL LYMPH NODE BIOPSY Left 12/13/2014   Procedure: LEFT BREAST LUMPECTOMY WITH RADIOACTIVE SEED AND SENTINEL LYMPH NODE BIOPSY;  Surgeon: Sara Nephew, MD;  [facility];  Service: General;  Laterality: Left;   COLONOSCOPY WITH PROPOFOL   05/28/2011; 06/28/2014   EXPLORATORY LAPAROTOMY  04/08/2018   with end ileostomy takedown and creation of loop ileostomy-- [facility]   FLEXIBLE SIGMOIDOSCOPY N/A 01/08/2016   Procedure: [facility];  Surgeon: Sara Deward Naval, MD;  Location: [facility];  Service: Gastroenterology;  Laterality: N/A;  needs MAC due to pain and anxiety   ILEOSTOMY N/A 05/25/2017   Procedure: DILATION OF ILEOSTOMY;  Surgeon:  Sara Cough, MD;  Location: [facility];  Service: General;  Laterality: N/A;   ILEOSTOMY  02/11/2016   END ileostomy.  Dr [facility]   ILEOSTOMY CLOSURE N/A 06/02/2017   Procedure: RESECTION OF ILEAL STRICTURE; ILEOSTOMY REVISION ;  Surgeon: Sara Standing, MD;  Location: [facility];  Service: General;  Laterality: N/A;   ILEOSTOMY DILATION  05/25/2017   DILITATION OF ILEAL STRICTURE   ileostomy stricture resection  06/02/2017   with revision   INCISION AND DRAINAGE ABSCESS  2018   INCISION AND DRAINAGE PERIRECTAL ABSCESS N/A 09/28/2013   Procedure: IRRIGATION AND DEBRIDEMENT  PERIANAL ABSCESS, proctoscopy;  Surgeon: Sara VEAR Angle, MD;  Location: [facility];  Service: General;  Laterality: N/A;   INCISION AND DRAINAGE PERIRECTAL ABSCESS N/A 01/22/2016   Procedure: IRRIGATION AND DEBRIDEMENT PERIRECTAL ABSCESS;  Surgeon: Sara Spinner, MD;  Location: [facility];  Service: General;  Laterality: N/A;   INNER EAR SURGERY Bilateral    LAPAROSCOPIC DIVERTED COLOSTOMY N/A 02/11/2016   Procedure: LAPAROSCOPIC DIVERTED ILEOSTOMY;  Surgeon: Barnes Gladis, MD;  Location: [facility];  Service: General;  Laterality: N/A;   LAPAROSCOPY N/A 03/23/2017   Procedure: LAPAROSCOPY DIAGNOSTIC, EXPLORATORY LAPAROTOMY,  BOWEL RESECTION;  Surgeon: Sara Krystal, MD;  Location: [facility];  Service: General;  Laterality: N/A;    Family History  Problem Relation Age of Onset   Prostate cancer Brother 73   Kidney disease Brother    Diabetes Brother    Brain cancer Father    Rheum arthritis Mother    Anesthesia problems Sister        post-op N/V   Emphysema Maternal Grandfather    Asthma Sister    Rheum arthritis Brother    Esophageal cancer Neg Hx    Breast cancer Neg Hx    Rectal cancer Neg Hx    Stomach cancer Neg Hx    Colon cancer Neg Hx     Social History:  reports that she has never smoked. She has never used smokeless tobacco. She reports current alcohol  use of about 7.0 standard drinks of alcohol  per week. She reports that she does not use drugs.  Allergies:  Allergies  Allergen Reactions   Nsaids Other (See Comments)    Crohn's disease = NO NSAIDs   Codeine Nausea And Vomiting   Iodinated Contrast Media     Elevated BP,Itchy skin. Face red in color   Mesalamine  Nausea And Vomiting   Omnipaque  [Iohexol ] Itching    Itching and flushed/red face   Oxycodone  Itching and Rash   Lactose Intolerance (Gi) Diarrhea   Norco [Hydrocodone -Acetaminophen ] Nausea Only   Other Itching    PERFUMED LOTIONS  Cant have MRI due to ear implant    Sulfa Antibiotics Rash    Medications:  No  current facility-administered medications on file prior to encounter.   Current Outpatient Medications on File Prior to Encounter  Medication Sig Dispense Refill   acetaminophen  (TYLENOL ) 500 MG tablet You can take 2 tablets every 6 hours as needed for pain.  Use this as your primary pain control.  Use Tramadol  as a last resort.  You can buy this over the counter at any drug store.    DO NOT TAKE MORE THAN 4000 MG OF TYLENOL  PER DAY.  IT CAN HARM YOUR LIVER. 30 tablet 0   ALPRAZolam  (XANAX ) 0.25 MG tablet Take 1 tablet (0.25 mg total) by mouth 2 (two) times daily as needed for anxiety. 60 tablet 0   cyanocobalamin  (VITAMIN B12) 1000 MCG/ML injection  INJECT 1ML INTO THE MUSCLE ONCE PER MONTH 3 mL 11   diphenoxylate -atropine  (LOMOTIL ) 2.5-0.025 MG tablet TAKE 1 TO 2 TABLETS BY MOUTH THREE TO FOUR TIMES DAILY AS NEEDED FOR SEVERE DIARRHEA 240 tablet 3   famotidine  (PEPCID ) 20 MG tablet Take 1 tablet (20 mg total) by mouth 2 (two) times daily. (Patient taking differently: Take 20 mg by mouth daily as needed for heartburn.) 180 tablet 6   FLUoxetine  (PROZAC ) 20 MG capsule TAKE 1 CAPSULE(20 MG) BY MOUTH DAILY 90 capsule 1   fluticasone  (FLONASE ) 50 MCG/ACT nasal spray Place 2 sprays into both nostrils daily. (Patient taking differently: Place 2 sprays into both nostrils daily as needed for rhinitis.) 16 g 6   loperamide  (IMODIUM ) 2 MG capsule Take 1 capsule (2 mg total) by mouth as needed for diarrhea or loose stools. 90 capsule 1   predniSONE  (DELTASONE ) 5 MG tablet TAKE 1 TABLET BY MOUTH EVERY DAY 90 tablet 3   zolpidem  (AMBIEN ) 10 MG tablet TAKE 1 TABLET(10 MG) BY MOUTH AT BEDTIME AS NEEDED FOR SLEEP 30 tablet 1   lidocaine -prilocaine  (EMLA ) cream Apply 1 Application topically as needed (FOR INEJCTION PAIN). (Patient not taking: Reported on 08/14/2023) 30 g 0   ondansetron  (ZOFRAN -ODT) 4 MG disintegrating tablet DISSOLVE ONE TABLET BY MOUTH EVERY 6 HOURS AS NEEDED FOR NAUSEA AND VOMITING (Patient not  taking: Reported on 08/14/2023) 30 tablet 3   prochlorperazine  (COMPAZINE ) 5 MG tablet Take 1-2 tablets (5-10 mg total) by mouth every 6 (six) hours as needed for nausea or vomiting. (Patient not taking: Reported on 08/14/2023) 30 tablet 2     ROS: Constitutional: No fever or chills Vision: No changes in vision ENT: No difficulty swallowing CV: No chest pain Pulm: No SOB or wheezing GI: No nausea or vomiting GU: No urgency or inability to hold urine Skin: No poor wound healing Neurologic: No numbness or tingling Psychiatric: No depression or anxiety Heme: No bruising Allergic: No reaction to medications or food   Exam: Blood pressure (!) 140/70, pulse 91, temperature 98 F (36.7 C), temperature source Oral, resp. rate 17, height 5' 6 (1.676 m), weight 70.3 kg, SpO2 98%. General: No acute distress Orientation: Awake alert and oriented x 3 Mood and Affect: Cooperative and pleasant Gait: Unable to assess due to her fracture Coordination and balance: Within normal limits  Left lower extremity: Leg is shortened and externally rotated.  No deformity through the distal portion of the femur.  No knee effusion.  Compartments are soft compressible.  She has active dorsiflexion plantarflexion of foot and ankle with a warm well-perfused foot with 2+ DP and PT pulses.  Right lower extremity: Skin without lesions. No tenderness to palpation. Full painless ROM, full strength in each muscle groups without evidence of instability.   Medical Decision Making: Data: Imaging: X-rays are reviewed which shows a left proximal femur fracture with significant varus angulation.  Labs:  Results for orders placed or performed during the hospital encounter of 08/14/23 (from the past 24 hours)  Basic metabolic panel     Status: Abnormal   Collection Time: 08/14/23  1:09 AM  Result Value Ref Range   Sodium 136 135 - 145 mmol/L   Potassium 3.7 3.5 - 5.1 mmol/L   Chloride 105 98 - 111 mmol/L   CO2 19 (L) 22  - 32 mmol/L   Glucose, Bld 116 (H) 70 - 99 mg/dL   BUN 12 8 - 23 mg/dL   Creatinine, Ser 9.08 0.44 - 1.00  mg/dL   Calcium  9.2 8.9 - 10.3 mg/dL   GFR, Estimated >39 >39 mL/min   Anion gap 12 5 - 15  CBC with Differential     Status: Abnormal   Collection Time: 08/14/23  1:09 AM  Result Value Ref Range   WBC 20.4 (H) 4.0 - 10.5 K/uL   RBC 4.44 3.87 - 5.11 MIL/uL   Hemoglobin 14.3 12.0 - 15.0 g/dL   HCT 58.4 63.9 - 53.9 %   MCV 93.5 80.0 - 100.0 fL   MCH 32.2 26.0 - 34.0 pg   MCHC 34.5 30.0 - 36.0 g/dL   RDW 87.8 88.4 - 84.4 %   Platelets 247 150 - 400 K/uL   nRBC 0.0 0.0 - 0.2 %   Neutrophils Relative % 84 %   Neutro Abs 17.2 (H) 1.7 - 7.7 K/uL   Lymphocytes Relative 9 %   Lymphs Abs 1.7 0.7 - 4.0 K/uL   Monocytes Relative 6 %   Monocytes Absolute 1.3 (H) 0.1 - 1.0 K/uL   Eosinophils Relative 0 %   Eosinophils Absolute 0.0 0.0 - 0.5 K/uL   Basophils Relative 0 %   Basophils Absolute 0.1 0.0 - 0.1 K/uL   Immature Granulocytes 1 %   Abs Immature Granulocytes 0.10 (H) 0.00 - 0.07 K/uL  Protime-INR     Status: None   Collection Time: 08/14/23  1:09 AM  Result Value Ref Range   Prothrombin Time 13.6 11.4 - 15.2 seconds   INR 1.0 0.8 - 1.2  Type and screen [facility]     Status: None   Collection Time: 08/14/23  1:31 AM  Result Value Ref Range   ABO/RH(D) A NEG    Antibody Screen NEG    Sample Expiration      08/17/2023,2359 Performed at [facility], [address], [place], [place] [address]    *Note: Due to a large number of results and/or encounters for the requested time period, some results have not been displayed. A complete set of results can be found in Results Review.     Imaging or Labs ordered: None  Medical history and chart was reviewed and case discussed with medical provider.  Assessment/Plan: 83 year old female with left intertrochanteric femur fracture.  Due to the unstable nature of her injury I recommend proceeding with  cephalomedullary nailing of her left hip.  Risk and benefits were discussed with the patient.  Risks included but not limited to bleeding, infection, malunion, nonunion, hardware failure, heart Tatian, nerve or blood vessel injury, DVT, even the possibility anesthetic complications.  She agreed to proceed with surgery and consent was obtained.  Franky MYRTIS Light, MD [facility] [phone] (office) [email or web address]

## 2023-08-14 NOTE — Anesthesia Preprocedure Evaluation (Addendum)
 Anesthesia Evaluation  Patient identified by MRN, date of birth, ID band Patient awake    Reviewed: Allergy & Precautions, NPO status , Patient's Chart, lab work & pertinent test results  History of Anesthesia Complications (+) PONV and history of anesthetic complications  Airway Mallampati: II  TM Distance: >3 FB Neck ROM: Full    Dental  (+) Dental Advisory Given, Caps   Pulmonary neg pulmonary ROS   Pulmonary exam normal        Cardiovascular negative cardio ROS Normal cardiovascular exam   '22 TTE - Poor image quality due to breast implants and patient stopped exam before done. EF 60 to 65%. No significant valvular d/o identified.     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                              Anesthesia Physical Anesthesia Plan  ASA: 3  Anesthesia Plan: General   Post-op Pain Management: Tylenol  PO (pre-op)*   Induction: Intravenous  PONV Risk Score and Plan: 4 or greater and Treatment may vary due to age or medical condition, Ondansetron  and TIVA  Airway Management Planned: Oral ETT  Additional Equipment: None  Intra-op Plan:   Post-operative Plan: Extubation in OR  Informed Consent: I have reviewed the patients History and Physical, chart, labs and discussed the procedure including the risks, benefits and alternatives for the proposed anesthesia with the patient or authorized representative who has indicated his/her understanding and acceptance.     Dental advisory  given  Plan Discussed with: CRNA and Anesthesiologist  Anesthesia Plan Comments:        Anesthesia Quick Evaluation

## 2023-08-14 NOTE — ED Triage Notes (Signed)
 Arrives [facility] from home after a fall ~9pm where she says she tripped. Landing on (L) hip with suspected shortening and rotation present.   4mg  zofran  IM.

## 2023-08-14 NOTE — ED Notes (Signed)
 Patient was incont of stool when she fell attempted to clean patient however she and sister stated they wanted to wait for her pain medication to work a little and then we will clean patient .

## 2023-08-14 NOTE — Plan of Care (Signed)
  Problem: Safety: Goal: Ability to remain free from injury will improve Outcome: Progressing   Problem: Skin Integrity: Goal: Risk for impaired skin integrity will decrease Outcome: Progressing   Problem: Education: Goal: Verbalization of understanding the information provided (i.e., activity precautions, restrictions, etc) will improve Outcome: Progressing   Problem: Clinical Measurements: Goal: Postoperative complications will be avoided or minimized Outcome: Progressing   Problem: Self-Concept: Goal: Ability to maintain and perform role responsibilities to the fullest extent possible will improve Outcome: Progressing   Problem: Pain Management: Goal: Pain level will decrease Outcome: Progressing

## 2023-08-14 NOTE — H&P (Signed)
 History and Physical    Patient: Sara Barnes FMW:[id number] DOB: 11/05/1940 DOA: 08/14/2023 DOS: the patient was seen and examined on 08/14/2023 PCP: Antonio Cyndee Jamee JONELLE, DO  Patient coming from: Home  Chief Complaint:  Chief Complaint  Patient presents with   Fall   HPI: Sara Barnes is a 83 y.o. female with medical history significant of afib not on anticoagulation, hx breast cancer followed by Oncology, crohn's disease who presents to ED following mechanical fall resulting in L hip pain. Pt reported being startled by an arthropod overnight which resulted in pt flinching back and causing her to lose her balance. Pt then fell, resulting in L hip pain.   In the ED,  head and neck CT was found to be unremarkable. Xray confirmed acute L hip fracture. Orthopedic Surgery consulted. Hospitalist consulted for consideration for admission. On further questioning, pt denied florid syncope. Does continue to complain of uncontrolled hip pain despite 0.5mg  doses of dialudid this AM.   Review of Systems: As mentioned in the history of present illness. All other systems reviewed and are negative. 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   Seasonal allergies    TMJ (temporomandibular joint syndrome)    Ulcerative colitis    Vitamin B12 deficiency    Past Surgical History:  Procedure Laterality Date   ABDOMINAL HYSTERECTOMY     partial   APPENDECTOMY     AUGMENTATION MAMMAPLASTY Bilateral    BASAL CELL CARCINOMA EXCISION Left    nose   BREAST BIOPSY Left 2019   benign   BREAST ENHANCEMENT SURGERY     BREAST LUMPECTOMY Left 2016   BREAST LUMPECTOMY WITH RADIOACTIVE SEED AND SENTINEL LYMPH NODE BIOPSY Left 12/13/2014   Procedure: LEFT BREAST LUMPECTOMY WITH RADIOACTIVE SEED AND SENTINEL LYMPH NODE BIOPSY;  Surgeon: Jina Nephew, MD;  Location:  [facility];  Service: General;  Laterality: Left;   COLONOSCOPY WITH PROPOFOL   05/28/2011; 06/28/2014   EXPLORATORY LAPAROTOMY  04/08/2018   with end ileostomy takedown and creation of loop ileostomy-- [facility]Dr Cy Brochure   FLEXIBLE SIGMOIDOSCOPY N/A 01/08/2016   Procedure: ENID MORIN;  Surgeon: Elspeth Deward Naval, MD;  Location: [facility];  Service: Gastroenterology;  Laterality: N/A;  needs MAC due to pain and anxiety   ILEOSTOMY N/A 05/25/2017   Procedure: DILATION OF ILEOSTOMY;  Surgeon:  Ebbie Cough, MD;  Location: [facility];  Service: General;  Laterality: N/A;   ILEOSTOMY  02/11/2016   END ileostomy.  Dr Gladis   ILEOSTOMY CLOSURE N/A 06/02/2017   Procedure: RESECTION OF ILEAL STRICTURE; ILEOSTOMY REVISION ;  Surgeon: Sheldon Standing, MD;  Location: [facility];  Service: General;  Laterality: N/A;   ILEOSTOMY DILATION  05/25/2017   DILITATION OF ILEAL STRICTURE   ileostomy stricture resection  06/02/2017   with revision   INCISION AND DRAINAGE ABSCESS  2018   INCISION AND DRAINAGE PERIRECTAL ABSCESS N/A 09/28/2013   Procedure: IRRIGATION AND  DEBRIDEMENT PERIANAL ABSCESS, proctoscopy;  Surgeon: Alm VEAR Angle, MD;  Location: [facility];  Service: General;  Laterality: N/A;   INCISION AND DRAINAGE PERIRECTAL ABSCESS N/A 01/22/2016   Procedure: IRRIGATION AND DEBRIDEMENT PERIRECTAL ABSCESS;  Surgeon: Krystal Spinner, MD;  Location: [facility];  Service: General;  Laterality: N/A;   INNER EAR SURGERY Bilateral    LAPAROSCOPIC DIVERTED COLOSTOMY N/A 02/11/2016   Procedure: LAPAROSCOPIC DIVERTED ILEOSTOMY;  Surgeon: Cough Gladis, MD;  Location: [facility];  Service: General;  Laterality: N/A;   LAPAROSCOPY N/A 03/23/2017   Procedure: LAPAROSCOPY DIAGNOSTIC, EXPLORATORY LAPAROTOMY,  BOWEL RESECTION;  Surgeon: Lily Krystal, MD;  Location: [facility];  Service: General;  Laterality: N/A;   Social History:  reports that she has never smoked. She has never used smokeless tobacco. She reports current alcohol  use of about 7.0 standard drinks of alcohol  per week. She reports that she does not use drugs.  Allergies  Allergen Reactions   Nsaids Other (See Comments)    Crohn's disease = NO NSAIDs   Codeine Nausea And Vomiting   Iodinated Contrast Media     Elevated BP,Itchy skin. Face red in color   Mesalamine  Nausea And Vomiting   Omnipaque  [Iohexol ] Itching    Itching and flushed/red face   Oxycodone  Itching and Rash   Lactose Intolerance (Gi) Diarrhea   Norco [Hydrocodone -Acetaminophen ] Nausea Only   Other Itching    PERFUMED LOTIONS  Cant have MRI due to ear implant    Sulfa Antibiotics Rash    Family History  Problem Relation Age of Onset   Prostate cancer Brother 62   Kidney disease Brother    Diabetes Brother    Brain cancer Father    Rheum arthritis Mother    Anesthesia problems Sister        post-op N/V   Emphysema Maternal Grandfather    Asthma Sister    Rheum arthritis Brother    Esophageal cancer Neg Hx    Breast cancer Neg Hx    Rectal cancer Neg Hx    Stomach cancer Neg Hx    Colon cancer Neg Hx     Prior to Admission  medications   Medication Sig Start Date End Date Taking? Authorizing Provider  acetaminophen  (TYLENOL ) 500 MG tablet You can take 2 tablets every 6 hours as needed for pain.  Use this as your primary pain control.  Use Tramadol  as a last resort.  You can buy this over the counter at any drug store.    DO NOT TAKE MORE THAN 4000 MG OF TYLENOL  PER DAY.  IT CAN HARM YOUR LIVER. 03/31/17  Yes Tonnie George, PA-C  ALPRAZolam  (XANAX ) 0.25 MG tablet Take 1 tablet (0.25 mg total) by mouth 2 (two) times daily as needed for anxiety. 08/11/23  Yes Lanny Callander, MD  cyanocobalamin  (VITAMIN B12) 1000 MCG/ML injection INJECT 1ML INTO THE MUSCLE ONCE PER MONTH 03/27/22  Yes  Pyrtle, Gordy HERO, MD  diphenoxylate -atropine  (LOMOTIL ) 2.5-0.025 MG tablet TAKE 1 TO 2 TABLETS BY MOUTH THREE TO FOUR TIMES DAILY AS NEEDED FOR SEVERE DIARRHEA 05/21/23  Yes Zehr, Jessica D, PA-C  famotidine  (PEPCID ) 20 MG tablet Take 1 tablet (20 mg total) by mouth 2 (two) times daily. Patient taking differently: Take 20 mg by mouth daily as needed for heartburn. 01/16/21  Yes Pyrtle, Gordy HERO, MD  FLUoxetine  (PROZAC ) 20 MG capsule TAKE 1 CAPSULE(20 MG) BY MOUTH DAILY 03/13/23  Yes Lanny Callander, MD  fluticasone  (FLONASE ) 50 MCG/ACT nasal spray Place 2 sprays into both nostrils daily. Patient taking differently: Place 2 sprays into both nostrils daily as needed for rhinitis. 11/12/20  Yes Antonio Cyndee Rockers R, DO  loperamide  (IMODIUM ) 2 MG capsule Take 1 capsule (2 mg total) by mouth as needed for diarrhea or loose stools. 11/02/19  Yes Pyrtle, Gordy HERO, MD  predniSONE  (DELTASONE ) 5 MG tablet TAKE 1 TABLET BY MOUTH EVERY DAY 05/21/23  Yes Zehr, Jessica D, PA-C  zolpidem  (AMBIEN ) 10 MG tablet TAKE 1 TABLET(10 MG) BY MOUTH AT BEDTIME AS NEEDED FOR SLEEP 07/16/23  Yes Lanny Callander, MD  lidocaine -prilocaine  (EMLA ) cream Apply 1 Application topically as needed (FOR INEJCTION PAIN). Patient not taking: Reported on 08/14/2023 05/18/23   Lanny Callander, MD  ondansetron   (ZOFRAN -ODT) 4 MG disintegrating tablet DISSOLVE ONE TABLET BY MOUTH EVERY 6 HOURS AS NEEDED FOR NAUSEA AND VOMITING Patient not taking: Reported on 08/14/2023 07/18/22   Lanny Callander, MD  prochlorperazine  (COMPAZINE ) 5 MG tablet Take 1-2 tablets (5-10 mg total) by mouth every 6 (six) hours as needed for nausea or vomiting. Patient not taking: Reported on 08/14/2023 08/11/23   Lanny Callander, MD    Physical Exam: Vitals:   08/14/23 0400 08/14/23 0615 08/14/23 0710 08/14/23 0810  BP: (!) 160/76 133/69 (!) 141/78 (!) 143/71  Pulse: (!) 103 (!) 103 (!) 106 92  Resp: 18 (!) 21 18 20   Temp: 98.2 F (36.8 C)  98.8 F (37.1 C)   TempSrc: Oral  Oral   SpO2: 98% 94% 100% 96%  Weight:      Height:       General exam: Awake, laying in bed, in nad Respiratory system: Normal respiratory effort, no wheezing Cardiovascular system: regular rate, s1, s2 Gastrointestinal system: Soft, nondistended, positive BS Central nervous system: CN2-12 grossly intact, strength intact Extremities: Perfused, no clubbing Skin: Normal skin turgor, no notable skin lesions seen Psychiatry: Mood normal // no visual hallucinations   Data Reviewed:  Labs reviewed: Na 136, K 3.7, Cr 0.91, WBC 20.4, Hgb 14.3  Assessment and Plan: L hip fracture  -s/p mechanical fall, fracture confirmed on xray hip -Orthopedic Surgery consulted, anticipate surgery today -Continue analgesia as needed  Hx Crohn's disease -stable -Follows gastroenterologist, Dr. Albertus as outpatient -continued on daily prednisone   History of metastatic breast cancer -Patient noted to have mets to the chest wall which has been excised. -Known to have mets to bone, liver, hx malignant pleural effusion -Follows oncology.  Hx Paroxysmal atrial fibrillation -Currently rate controlled -Not a candidate for anticoagulation due to hx of intra-abdominal hematoma. -most recent 2D echo from 2022 with normal EF.  TSH from 12/22 of 1.286   Advance Care Planning:    Code Status: Full Code Full  Consults: Orthopedic Surgery  Family Communication: Pt in room, pt's sister at bedside  Severity of Illness: The appropriate patient status for this patient is INPATIENT. Inpatient status is judged to be reasonable and  necessary in order to provide the required intensity of service to ensure the patient's safety. The patient's presenting symptoms, physical exam findings, and initial radiographic and laboratory data in the context of their chronic comorbidities is felt to place them at high risk for further clinical deterioration. Furthermore, it is not anticipated that the patient will be medically stable for discharge from the hospital within 2 midnights of admission.   * I certify that at the point of admission it is my clinical judgment that the patient will require inpatient hospital care spanning beyond 2 midnights from the point of admission due to high intensity of service, high risk for further deterioration and high frequency of surveillance required.*  Author: Garnette Pelt, MD 08/14/2023 9:31 AM  For on call review [email or web address].

## 2023-08-14 NOTE — Progress Notes (Signed)
 Xrays reviewed.  Will need nail left hip.  Plan for later today.  Please keep NPO.  Full consult to follow.

## 2023-08-14 NOTE — ED Notes (Signed)
 Patient transported to CT

## 2023-08-14 NOTE — Interval H&P Note (Signed)
 History and Physical Interval Note:  08/14/2023 12:03 PM  Sara Barnes  has presented today for surgery, with the diagnosis of left hip intertrochanteric fracture.  The various methods of treatment have been discussed with the patient and family. After consideration of risks, benefits and other options for treatment, the patient has consented to  Procedure(s): FIXATION, FRACTURE, INTERTROCHANTERIC, WITH INTRAMEDULLARY ROD (Left) as a surgical intervention.  The patient's history has been reviewed, patient examined, no change in status, stable for surgery.  I have reviewed the patient's chart and labs.  Questions were answered to the patient's satisfaction.     Endia Moncur P Jackline Castilla

## 2023-08-14 NOTE — ED Provider Notes (Signed)
 [place] [facility] AT [facility] Provider Note   CSN: [id number] Arrival date & time: 08/14/23  0103     History  Chief Complaint  Patient presents with   Sara Barnes    Sara Barnes is a 83 y.o. female.  The history is provided by the patient.  Fall  She has history of inflammatory bowel disease, metastatic breast cancer and comes in following a fall at home.  She injured her left hip but also hit her head.  She is not on any anticoagulants.   Home Medications Prior to Admission medications   Medication Sig Start Date End Date Taking? Authorizing Provider  acetaminophen  (TYLENOL ) 500 MG tablet You can take 2 tablets every 6 hours as needed for pain.  Use this as your primary pain control.  Use Tramadol  as a last resort.  You can buy this over the counter at any drug store.    DO NOT TAKE MORE THAN 4000 MG OF TYLENOL  PER DAY.  IT CAN HARM YOUR LIVER. 03/31/17   Tonnie George, PA-C  ALPRAZolam  (XANAX ) 0.25 MG tablet Take 1 tablet (0.25 mg total) by mouth 2 (two) times daily as needed for anxiety. 08/11/23   Lanny Callander, MD  cephALEXin  (KEFLEX ) 500 MG capsule Take 1 capsule (500 mg total) by mouth 2 (two) times daily. 04/02/22   Antonio Cyndee Rockers R, DO  cyanocobalamin  (VITAMIN B12) 1000 MCG/ML injection INJECT 1ML INTO THE MUSCLE ONCE PER MONTH 03/27/22   Pyrtle, Gordy HERO, MD  diphenoxylate -atropine  (LOMOTIL ) 2.5-0.025 MG tablet TAKE 1 TO 2 TABLETS BY MOUTH THREE TO FOUR TIMES DAILY AS NEEDED FOR SEVERE DIARRHEA 05/21/23   Zehr, Jessica D, PA-C  famotidine  (PEPCID ) 20 MG tablet Take 1 tablet (20 mg total) by mouth 2 (two) times daily. 01/16/21   Pyrtle, Gordy HERO, MD  FLUoxetine  (PROZAC ) 20 MG capsule TAKE 1 CAPSULE(20 MG) BY MOUTH DAILY 03/13/23   Lanny Callander, MD  fluticasone  (FLONASE ) 50 MCG/ACT nasal spray Place 2 sprays into both nostrils daily. Patient taking differently: Place 2 sprays into both nostrils daily as needed for rhinitis. 11/12/20   Lowne Chase, Yvonne R, DO   lidocaine -prilocaine  (EMLA ) cream Apply 1 Application topically as needed (FOR INEJCTION PAIN). 05/18/23   Lanny Callander, MD  loperamide  (IMODIUM ) 2 MG capsule Take 1 capsule (2 mg total) by mouth as needed for diarrhea or loose stools. 11/02/19   Pyrtle, Gordy HERO, MD  ondansetron  (ZOFRAN -ODT) 4 MG disintegrating tablet DISSOLVE ONE TABLET BY MOUTH EVERY 6 HOURS AS NEEDED FOR NAUSEA AND VOMITING 07/18/22   Lanny Callander, MD  predniSONE  (DELTASONE ) 5 MG tablet TAKE 1 TABLET BY MOUTH EVERY DAY 05/21/23   Zehr, Jessica D, PA-C  prochlorperazine  (COMPAZINE ) 5 MG tablet Take 1-2 tablets (5-10 mg total) by mouth every 6 (six) hours as needed for nausea or vomiting. 08/11/23   Lanny Callander, MD  zolpidem  (AMBIEN ) 10 MG tablet TAKE 1 TABLET(10 MG) BY MOUTH AT BEDTIME AS NEEDED FOR SLEEP 07/16/23   Lanny Callander, MD      Allergies    Nsaids, Codeine, Iodinated contrast media, Mesalamine , Omnipaque  [iohexol ], Oxycodone , Lactose intolerance (gi), Norco [hydrocodone -acetaminophen ], Other, and Sulfa antibiotics    Review of Systems   Review of Systems  All other systems reviewed and are negative.   Physical Exam Updated Vital Signs BP (!) 175/79 (BP Location: Right Arm)   Pulse 92   Temp 98.2 F (36.8 C)   Resp 17   Ht 5' 6 (1.676  m)   Wt 70.3 kg   SpO2 99%   BMI 25.02 kg/m  Physical Exam Vitals and nursing note reviewed.   83 year old female, in obvious pain, but is in no acute distress. Vital signs are significant for elevated blood pressure. Oxygen saturation is 100%, which is normal. Head is normocephalic and atraumatic. PERRLA, EOMI.  Neck is nontender. Lungs are clear without rales, wheezes, or rhonchi. Chest is nontender. Heart has regular rate and rhythm without murmur. Abdomen is soft, flat, nontender. Extremities: Left leg is shortened and externally rotated.  There is marked tenderness to palpation anywhere around the hip and marked pain with any movement of the left leg.  No other extremity injuries  seen. Skin is warm and dry without rash. Neurologic: Awake and alert, moves all extremities equally except for the left leg which is not moved because of pain.  ED Results / Procedures / Treatments   Labs (all labs ordered are listed, but only abnormal results are displayed) Labs Reviewed  BASIC METABOLIC PANEL WITH GFR - Abnormal; Notable for the following components:      Result Value   CO2 19 (*)    Glucose, Bld 116 (*)    All other components within normal limits  CBC WITH DIFFERENTIAL/PLATELET - Abnormal; Notable for the following components:   WBC 20.4 (*)    Neutro Abs 17.2 (*)    Monocytes Absolute 1.3 (*)    Abs Immature Granulocytes 0.10 (*)    All other components within normal limits  PROTIME-INR  TYPE AND SCREEN    EKG None  Radiology DG Chest 1 View Result Date: 08/14/2023 CLINICAL DATA:  Medical clearance for surgical intervention EXAM: CHEST  1 VIEW COMPARISON:  None Available. FINDINGS: The lungs are mildly hyperinflated in keeping with changes of underlying COPD. Opacity involving the left mid lung zone a likely relates to overlying left breast implant. Lungs are clear. No pneumothorax or pleural effusion. Cardiac size within normal limits. No acute bone abnormality. Bilateral breast implants. Multiple surgical clips within the breast. IMPRESSION: 1. No active disease. COPD. Electronically Signed   By: Dorethia Molt M.D.   On: 08/14/2023 02:23   DG Hip Unilat W or Wo Pelvis 2-3 Views Left Result Date: 08/14/2023 CLINICAL DATA:  Fall, left hip pain EXAM: DG HIP (WITH OR WITHOUT PELVIS) 2-3V LEFT COMPARISON:  None Available. FINDINGS: There is an acute, intratrochanteric left hip fracture with 1 shaft with medial displacement, 1 cm override, and varus angulation of the distal fracture fragment. Femoral head is still seated left acetabulum. Mild bilateral degenerative hip arthritis. Pelvis and visualized right hip are intact. Left hip periarticular soft tissue swelling.  IMPRESSION: 1. Acute intratrochanteric left hip fracture as described above. Electronically Signed   By: Dorethia Molt M.D.   On: 08/14/2023 02:17   CT HEAD WO CONTRAST Result Date: 08/14/2023 CLINICAL DATA:  Head trauma, minor (Age >= 65y); Neck trauma (Age >= 65y) fall EXAM: CT HEAD WITHOUT CONTRAST CT CERVICAL SPINE WITHOUT CONTRAST TECHNIQUE: Multidetector CT imaging of the head and cervical spine was performed following the standard protocol without intravenous contrast. Multiplanar CT image reconstructions of the cervical spine were also generated. RADIATION DOSE REDUCTION: This exam was performed according to the departmental dose-optimization program which includes automated exposure control, adjustment of the mA and/or kV according to patient size and/or use of iterative reconstruction technique. COMPARISON:  None Available. FINDINGS: CT HEAD FINDINGS Brain: Normal anatomic configuration. Parenchymal volume loss is commensurate with  the patient's age. Mild periventricular white matter changes are present likely reflecting the sequela of small vessel ischemia. No abnormal intra or extra-axial mass lesion or fluid collection. No abnormal mass effect or midline shift. No evidence of acute intracranial hemorrhage or infarct. Ventricular size is normal. Cerebellum unremarkable. Vascular: No asymmetric hyperdense vasculature at the skull base. Skull: Intact Sinuses/Orbits: Paranasal sinuses are clear. Orbits are unremarkable. Other: Mastoid air cells and middle ear cavities are clear. Small left parietal scalp hematoma. CT CERVICAL SPINE FINDINGS Alignment: Normal. Skull base and vertebrae: No acute fracture. No primary bone lesion or focal pathologic process. Soft tissues and spinal canal: No prevertebral fluid or swelling. No visible canal hematoma. Disc levels: There is intervertebral disc space narrowing and endplate remodeling throughout the cervical spine, most severe at C5-C7 in keeping with changes of  moderate to severe degenerative disc disease. Prevertebral soft tissues are not thickened on sagittal reformats. No high-grade canal stenosis. Multilevel uncovertebral and facet arthrosis results multilevel neuroforaminal narrowing, most severe on the left at C5-6 and to a lesser extent on the right at C5-6 and bilaterally at C6-7. Upper chest: Negative. Other: None removed IMPRESSION: 1. No acute intracranial abnormality. 2. Small left parietal scalp hematoma. No calvarial fracture. 3. No acute fracture of the cervical spine. 4. Multilevel degenerative changes of the cervical spine, most severe at C5-C7. 5. Multilevel uncovertebral and facet arthrosis results in multilevel neuroforaminal narrowing, most severe on the left at C5-6 and to a lesser extent on the right at C5-6 and bilaterally at C6-7. Electronically Signed   By: Dorethia Molt M.D.   On: 08/14/2023 01:52   CT CERVICAL SPINE WO CONTRAST Result Date: 08/14/2023 CLINICAL DATA:  Head trauma, minor (Age >= 65y); Neck trauma (Age >= 65y) fall EXAM: CT HEAD WITHOUT CONTRAST CT CERVICAL SPINE WITHOUT CONTRAST TECHNIQUE: Multidetector CT imaging of the head and cervical spine was performed following the standard protocol without intravenous contrast. Multiplanar CT image reconstructions of the cervical spine were also generated. RADIATION DOSE REDUCTION: This exam was performed according to the departmental dose-optimization program which includes automated exposure control, adjustment of the mA and/or kV according to patient size and/or use of iterative reconstruction technique. COMPARISON:  None Available. FINDINGS: CT HEAD FINDINGS Brain: Normal anatomic configuration. Parenchymal volume loss is commensurate with the patient's age. Mild periventricular white matter changes are present likely reflecting the sequela of small vessel ischemia. No abnormal intra or extra-axial mass lesion or fluid collection. No abnormal mass effect or midline shift. No  evidence of acute intracranial hemorrhage or infarct. Ventricular size is normal. Cerebellum unremarkable. Vascular: No asymmetric hyperdense vasculature at the skull base. Skull: Intact Sinuses/Orbits: Paranasal sinuses are clear. Orbits are unremarkable. Other: Mastoid air cells and middle ear cavities are clear. Small left parietal scalp hematoma. CT CERVICAL SPINE FINDINGS Alignment: Normal. Skull base and vertebrae: No acute fracture. No primary bone lesion or focal pathologic process. Soft tissues and spinal canal: No prevertebral fluid or swelling. No visible canal hematoma. Disc levels: There is intervertebral disc space narrowing and endplate remodeling throughout the cervical spine, most severe at C5-C7 in keeping with changes of moderate to severe degenerative disc disease. Prevertebral soft tissues are not thickened on sagittal reformats. No high-grade canal stenosis. Multilevel uncovertebral and facet arthrosis results multilevel neuroforaminal narrowing, most severe on the left at C5-6 and to a lesser extent on the right at C5-6 and bilaterally at C6-7. Upper chest: Negative. Other: None removed IMPRESSION: 1. No acute intracranial abnormality.  2. Small left parietal scalp hematoma. No calvarial fracture. 3. No acute fracture of the cervical spine. 4. Multilevel degenerative changes of the cervical spine, most severe at C5-C7. 5. Multilevel uncovertebral and facet arthrosis results in multilevel neuroforaminal narrowing, most severe on the left at C5-6 and to a lesser extent on the right at C5-6 and bilaterally at C6-7. Electronically Signed   By: Dorethia Molt M.D.   On: 08/14/2023 01:52    Procedures Procedures    Medications Ordered in ED Medications  morphine  (PF) 4 MG/ML injection 4 mg (4 mg Intravenous Given 08/14/23 0248)  ondansetron  (ZOFRAN ) injection 4 mg (4 mg Intravenous Given 08/14/23 0248)    ED Course/ Medical Decision Making/ A&P                                 Medical  Decision Making Amount and/or Complexity of Data Reviewed Labs: ordered. Radiology: ordered.  Risk Prescription drug management. Decision regarding hospitalization.   Fall with injury to left hip, also incidental minor head injury.  I have ordered morphine  for pain and I have ordered x-rays of the left hip as well as CT of head and cervical spine.  In anticipation of probable surgery, I have ordered a chest x-ray.  X-rays confirm left intertrochanteric hip fracture.  CT of the head and cervical spine show evidence of scalp hematoma but no other acute injury.  Chest x-ray is unremarkable.  I independently viewed all of the images, and agree with the radiologist's interpretation.  Patient has required 2 extra doses of morphine  for pain control.  I have discussed case with Dr. Sharl of orthopedic service who agrees to see the patient in consultation, recommends keeping the patient n.p.o. for possible surgery in the morning.  I have discussed the case with Dr. Charlton of [facility], who agrees to admit the patient.  Final Clinical Impression(s) / ED Diagnoses Final diagnoses:  Closed intertrochanteric fracture of left hip, initial encounter (HCC)  Elevated random blood glucose level    Rx / DC Orders ED Discharge Orders     None         Raford Lenis, MD 08/14/23 [id number]

## 2023-08-14 NOTE — Discharge Instructions (Addendum)
 Orthopaedic Trauma Service Discharge Instructions   General Discharge Instructions  WEIGHT BEARING STATUS:weightbearing as tolerated  RANGE OF MOTION/ACTIVITY: ok for hip motion as tolerated  Wound Care: You may remove your surgical dressing on post op day 2 (Sunday 08/16/23). Incisions can be left open to air if there is no drainage. Once the incision is completely dry and without drainage, it may be left open to air out.  Showering may begin post op 3 (Monday 08/17/23).  Clean incision gently with soap and water.  DVT/PE prophylaxis: Aspirin  325 mg daily x 30 days  Diet: as you were eating previously.  Can use over the counter stool softeners and bowel preparations, such as Miralax , to help with bowel movements.  Narcotics can be constipating.  Be sure to drink plenty of fluids  PAIN MEDICATION USE AND EXPECTATIONS  You have likely been given narcotic medications to help control your pain.  After a traumatic event that results in an fracture (broken bone) with or without surgery, it is ok to use narcotic pain medications to help control one's pain.  We understand that everyone responds to pain differently and each individual patient will be evaluated on a regular basis for the continued need for narcotic medications. Ideally, narcotic medication use should last no more than 6-8 weeks (coinciding with fracture healing).   As a patient it is your responsibility as well to monitor narcotic medication use and report the amount and frequency you use these medications when you come to your office visit.   We would also advise that if you are using narcotic medications, you should take a dose prior to therapy to maximize you participation.  IF YOU ARE ON NARCOTIC MEDICATIONS IT IS NOT PERMISSIBLE TO OPERATE A MOTOR VEHICLE (MOTORCYCLE/CAR/TRUCK/MOPED) OR HEAVY MACHINERY DO NOT MIX NARCOTICS WITH OTHER CNS (CENTRAL NERVOUS SYSTEM) DEPRESSANTS SUCH AS ALCOHOL  POST-OPERATIVE OPIOID TAPER  INSTRUCTIONS: It is important to wean off of your opioid medication as soon as possible. If you do not need pain medication after your surgery it is ok to stop day one. Opioids include: Codeine, Hydrocodone (Norco, Vicodin), Oxycodone (Percocet, oxycontin ) and hydromorphone  amongst others.  Long term and even short term use of opiods can cause: Increased pain response Dependence Constipation Depression Respiratory depression And more.  Withdrawal symptoms can include Flu like symptoms Nausea, vomiting And more Techniques to manage these symptoms Hydrate well Eat regular healthy meals Stay active Use relaxation techniques(deep breathing, meditating, yoga) Do Not substitute Alcohol to help with tapering If you have been on opioids for less than two weeks and do not have pain than it is ok to stop all together.  Plan to wean off of opioids This plan should start within one week post op of your fracture surgery  Maintain the same interval or time between taking each dose and first decrease the dose.  Cut the total daily intake of opioids by one tablet each day Next start to increase the time between doses. The last dose that should be eliminated is the evening dose.    STOP SMOKING OR USING NICOTINE PRODUCTS!!!!  As discussed nicotine severely impairs your body's ability to heal surgical and traumatic wounds but also impairs bone healing.  Wounds and bone heal by forming microscopic blood vessels (angiogenesis) and nicotine is a vasoconstrictor (essentially, shrinks blood vessels).  Therefore, if vasoconstriction occurs to these microscopic blood vessels they essentially disappear and are unable to deliver necessary nutrients to the healing tissue.  This is one modifiable factor  that you can do to dramatically increase your chances of healing your injury.  (This means no smoking, no nicotine gum, patches, etc)  DO NOT USE NONSTEROIDAL ANTI-INFLAMMATORY DRUGS (NSAID'S)  Using products such  as Advil (ibuprofen), Aleve (naproxen), Motrin (ibuprofen) for additional pain control during fracture healing can delay and/or prevent the healing response.  If you would like to take over the counter (OTC) medication, Tylenol  (acetaminophen ) is ok.  However, some narcotic medications that are given for pain control contain acetaminophen  as well. Therefore, you should not exceed more than 4000 mg of tylenol  in a day if you do not have liver disease.  Also note that there are may OTC medicines, such as cold medicines and allergy medicines that my contain tylenol  as well.  If you have any questions about medications and/or interactions please ask your doctor/PA or your pharmacist.      ICE AND ELEVATE INJURED/OPERATIVE EXTREMITY  Using ice and elevating the injured extremity above your heart can help with swelling and pain control.  Icing in a pulsatile fashion, such as 20 minutes on and 20 minutes off, can be followed.    Do not place ice directly on skin. Make sure there is a barrier between to skin and the ice pack.    Using frozen items such as frozen peas works well as the conform nicely to the are that needs to be iced.  USE AN ACE WRAP OR TED HOSE FOR SWELLING CONTROL  In addition to icing and elevation, Ace wraps or TED hose are used to help limit and resolve swelling.  It is recommended to use Ace wraps or TED hose until you are informed to stop.    When using Ace Wraps start the wrapping distally (farthest away from the body) and wrap proximally (closer to the body)   Example: If you had surgery on your leg or thing and you do not have a splint on, start the ace wrap at the toes and work your way up to the thigh        If you had surgery on your upper extremity and do not have a splint on, start the ace wrap at your fingers and work your way up to the upper arm  CALL THE OFFICE FOR MEDICATION REFILLS OR WITH ANY QUESTIONS/CONCERNS: [phone]   VISIT OUR WEBSITE FOR ADDITIONAL INFORMATION:  [email or web address]  Discharge Wound Care Instructions  Do NOT apply any ointments, solutions or lotions to pin sites or surgical wounds.  These prevent needed drainage and even though solutions like hydrogen peroxide kill bacteria, they also damage cells lining the pin sites that help fight infection.  Applying lotions or ointments can keep the wounds moist and can cause them to breakdown and open up as well. This can increase the risk for infection. When in doubt call the office.  If any drainage is noted, use mepilex - These dressing supplies should be available at local medical supply stores [facility], [facility], etc) as well as Insurance claims handler ([facility], [facility], [employer], etc)  Once the incision is completely dry and without drainage, it may be left open to air out.  Showering may begin 36-48 hours later.  Cleaning gently with soap and water.  Traumatic wounds should be dressed daily as well.    One layer of adaptic, gauze, Kerlix, then ace wrap.  The adaptic can be discontinued once the draining has ceased    If you have a wet to dry dressing: wet the gauze with  saline the squeeze as much saline out so the gauze is moist (not soaking wet), place moistened gauze over wound, then place a dry gauze over the moist one, followed by Kerlix wrap, then ace wrap.    Call office for the following: Temperature greater than 101F Persistent nausea and vomiting Severe uncontrolled pain Redness, tenderness, or signs of infection (pain, swelling, redness, odor or green/yellow discharge around the site) Difficulty breathing, headache or visual disturbances Hives Persistent dizziness or light-headedness Extreme fatigue Any other questions or concerns you may have after discharge  In an emergency, call 911 or go to an Emergency Department at a nearby hospital  OTHER HELPFUL INFORMATION  If you had a block, it will wear off between 8-24 hrs postop typically.  This is period when your pain  may go from nearly zero to the pain you would have had postop without the block.  This is an abrupt transition but nothing dangerous is happening.  You may take an extra dose of narcotic when this happens.  You should wean off your narcotic medicines as soon as you are able.  Most patients will be off or using minimal narcotics before their first postop appointment.   We suggest you use the pain medication the first night prior to going to bed, in order to ease any pain when the anesthesia wears off. You should avoid taking pain medications on an empty stomach as it will make you nauseous.  Do not drink alcoholic beverages or take illicit drugs when taking pain medications.  In most states it is against the law to drive while you are in a splint or sling.  And certainly against the law to drive while taking narcotics.  You may return to work/school in the next couple of days when you feel up to it.   Pain medication may make you constipated.  Below are a few solutions to try in this order: Decrease the amount of pain medication if you aren't having pain. Drink lots of decaffeinated fluids. Drink prune juice and/or each dried prunes  If the first 3 don't work start with additional solutions Take Colace - an over-the-counter stool softener Take Senokot - an over-the-counter laxative Take Miralax  - a stronger over-the-counter laxative

## 2023-08-14 NOTE — ED Notes (Signed)
 Patient was cleaned and new linens applied, purewick placed. Patient voiced feeling better.

## 2023-08-14 NOTE — Anesthesia Postprocedure Evaluation (Signed)
 Anesthesia Post Note  Patient: Sara Barnes  Procedure(s) Performed: FIXATION, FRACTURE, INTERTROCHANTERIC, WITH INTRAMEDULLARY ROD (Left)     Patient location during evaluation: PACU Anesthesia Type: General Level of consciousness: awake and alert Pain management: pain level controlled Vital Signs Assessment: post-procedure vital signs reviewed and stable Respiratory status: spontaneous breathing, nonlabored ventilation, respiratory function stable and patient connected to nasal cannula oxygen Cardiovascular status: blood pressure returned to baseline and stable Postop Assessment: no apparent nausea or vomiting Anesthetic complications: no  No notable events documented.  Last Vitals:  Vitals:   08/14/23 1500 08/14/23 1515  BP: 136/65 134/74  Pulse: 96 96  Resp: 13 15  Temp:    SpO2: 95% 94%                    Juventino Oppenheim

## 2023-08-14 NOTE — Progress Notes (Signed)
  Carryover admission to the Day Admitter.  I discussed this case with the EDP, Dr. Candelaria Chaco.  Per these discussions:   This is a 83 year old female who is being admitted with acute left intratrochanteric hip fx after ground-level mechanical fall, without loss of consciousness.  Not on any blood thinners as an outpatient.   EDP d/w on-call [facility], Dr. Reymundo Caulk, [facility], who conveyed that [facility] will formally consult, will see the pt in the AM, and requests that pt be kept NPO.   Orders are already placed for INR, type and screen.  I have placed an order for inpatient admission for further evaluation and management of the above.  I have placed some additional preliminary admit orders via the adult multi-morbid admission order set. I have also ordered n.p.o., fall precautions, prn IV fentanyl , as needed IV Zofran .  I have also added on magnesium  level.    Camelia Cavalier, DO Hospitalist

## 2023-08-14 NOTE — Consult Note (Signed)
 Orthopaedic Trauma Service (OTS) Consult   Patient ID: Sara Barnes MRN: [id number] DOB/AGE: [birth year] 83 y.o.  Reason for Consult:Left intertrochanteric femur fracture Referring Physician: Dr. Selinda Gosling, MD [facility]  HPI: Sara Barnes is an 83 y.o. female who is being seen in consultation at the request of Dr. Gosling for evaluation of left intertrochanteric femur fracture.  The patient had a ground-level fall sustaining above injury.  She presents to the emergency room where x-rays showed a left intertrochanteric/subtrochanteric femur fracture.  Due to OR availability I was asked to take over and manage her primarily.  Patient was seen and evaluated in the preoperative holding area.  Currently with her sister and son.  She is not having any pain other than her left leg.  She does have atrial fibrillation but is not on any anticoagulation.  She lives at home alone and ambulates without assist device.  She states that she is very active at baseline.  She does have a history of breast cancer for which she is being followed by oncology.  Denies any significant tobacco use.  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   Seasonal allergies    TMJ (temporomandibular joint syndrome)    Ulcerative colitis    Vitamin B12 deficiency     Past Surgical History:  Procedure Laterality Date   ABDOMINAL HYSTERECTOMY     partial   APPENDECTOMY     AUGMENTATION MAMMAPLASTY Bilateral    BASAL CELL CARCINOMA EXCISION Left    nose   BREAST BIOPSY Left 2019   benign   BREAST ENHANCEMENT SURGERY     BREAST LUMPECTOMY Left 2016   BREAST LUMPECTOMY WITH RADIOACTIVE SEED AND SENTINEL LYMPH NODE BIOPSY Left 12/13/2014   Procedure: LEFT BREAST LUMPECTOMY WITH RADIOACTIVE SEED AND SENTINEL LYMPH NODE BIOPSY;  Surgeon: Jina Nephew, MD;  Location: [facility];  Service: General;  Laterality: Left;   COLONOSCOPY WITH PROPOFOL   05/28/2011; 06/28/2014   EXPLORATORY LAPAROTOMY  04/08/2018   with end ileostomy takedown and creation of loop ileostomy-- [facility]Dr Cy Brochure   FLEXIBLE SIGMOIDOSCOPY N/A 01/08/2016   Procedure: ENID MORIN;  Surgeon: Elspeth Deward Naval, MD;  Location: [facility];  Service: Gastroenterology;  Laterality: N/A;  needs MAC due to pain and anxiety   ILEOSTOMY N/A 05/25/2017   Procedure: DILATION OF ILEOSTOMY;  Surgeon:  Ebbie Cough, MD;  Location: [facility];  Service: General;  Laterality: N/A;   ILEOSTOMY  02/11/2016   END ileostomy.  Dr Gladis   ILEOSTOMY CLOSURE N/A 06/02/2017   Procedure: RESECTION OF ILEAL STRICTURE; ILEOSTOMY REVISION ;  Surgeon: Sheldon Standing, MD;  Location: [facility];  Service: General;  Laterality: N/A;   ILEOSTOMY DILATION  05/25/2017   DILITATION OF ILEAL STRICTURE   ileostomy stricture resection  06/02/2017   with revision   INCISION AND DRAINAGE ABSCESS  2018   INCISION AND DRAINAGE PERIRECTAL ABSCESS N/A 09/28/2013   Procedure: IRRIGATION AND DEBRIDEMENT  PERIANAL ABSCESS, proctoscopy;  Surgeon: Alm VEAR Angle, MD;  Location: [facility];  Service: General;  Laterality: N/A;   INCISION AND DRAINAGE PERIRECTAL ABSCESS N/A 01/22/2016   Procedure: IRRIGATION AND DEBRIDEMENT PERIRECTAL ABSCESS;  Surgeon: Krystal Spinner, MD;  Location: [facility];  Service: General;  Laterality: N/A;   INNER EAR SURGERY Bilateral    LAPAROSCOPIC DIVERTED COLOSTOMY N/A 02/11/2016   Procedure: LAPAROSCOPIC DIVERTED ILEOSTOMY;  Surgeon: Cough Gladis, MD;  Location: [facility];  Service: General;  Laterality: N/A;   LAPAROSCOPY N/A 03/23/2017   Procedure: LAPAROSCOPY DIAGNOSTIC, EXPLORATORY LAPAROTOMY,  BOWEL RESECTION;  Surgeon: Lily Krystal, MD;  Location: [facility];  Service: General;  Laterality: N/A;    Family History  Problem Relation Age of Onset   Prostate cancer Brother 13   Kidney disease Brother    Diabetes Brother    Brain cancer Father    Rheum arthritis Mother    Anesthesia problems Sister        post-op N/V   Emphysema Maternal Grandfather    Asthma Sister    Rheum arthritis Brother    Esophageal cancer Neg Hx    Breast cancer Neg Hx    Rectal cancer Neg Hx    Stomach cancer Neg Hx    Colon cancer Neg Hx     Social History:  reports that she has never smoked. She has never used smokeless tobacco. She reports current alcohol  use of about 7.0 standard drinks of alcohol  per week. She reports that she does not use drugs.  Allergies:  Allergies  Allergen Reactions   Nsaids Other (See Comments)    Crohn's disease = NO NSAIDs   Codeine Nausea And Vomiting   Iodinated Contrast Media     Elevated BP,Itchy skin. Face red in color   Mesalamine  Nausea And Vomiting   Omnipaque  [Iohexol ] Itching    Itching and flushed/red face   Oxycodone  Itching and Rash   Lactose Intolerance (Gi) Diarrhea   Norco [Hydrocodone -Acetaminophen ] Nausea Only   Other Itching    PERFUMED LOTIONS  Cant have MRI due to ear implant    Sulfa Antibiotics Rash    Medications:  No  current facility-administered medications on file prior to encounter.   Current Outpatient Medications on File Prior to Encounter  Medication Sig Dispense Refill   acetaminophen  (TYLENOL ) 500 MG tablet You can take 2 tablets every 6 hours as needed for pain.  Use this as your primary pain control.  Use Tramadol  as a last resort.  You can buy this over the counter at any drug store.    DO NOT TAKE MORE THAN 4000 MG OF TYLENOL  PER DAY.  IT CAN HARM YOUR LIVER. 30 tablet 0   ALPRAZolam  (XANAX ) 0.25 MG tablet Take 1 tablet (0.25 mg total) by mouth 2 (two) times daily as needed for anxiety. 60 tablet 0   cyanocobalamin  (VITAMIN B12) 1000 MCG/ML injection  INJECT 1ML INTO THE MUSCLE ONCE PER MONTH 3 mL 11   diphenoxylate -atropine  (LOMOTIL ) 2.5-0.025 MG tablet TAKE 1 TO 2 TABLETS BY MOUTH THREE TO FOUR TIMES DAILY AS NEEDED FOR SEVERE DIARRHEA 240 tablet 3   famotidine  (PEPCID ) 20 MG tablet Take 1 tablet (20 mg total) by mouth 2 (two) times daily. (Patient taking differently: Take 20 mg by mouth daily as needed for heartburn.) 180 tablet 6   FLUoxetine  (PROZAC ) 20 MG capsule TAKE 1 CAPSULE(20 MG) BY MOUTH DAILY 90 capsule 1   fluticasone  (FLONASE ) 50 MCG/ACT nasal spray Place 2 sprays into both nostrils daily. (Patient taking differently: Place 2 sprays into both nostrils daily as needed for rhinitis.) 16 g 6   loperamide  (IMODIUM ) 2 MG capsule Take 1 capsule (2 mg total) by mouth as needed for diarrhea or loose stools. 90 capsule 1   predniSONE  (DELTASONE ) 5 MG tablet TAKE 1 TABLET BY MOUTH EVERY DAY 90 tablet 3   zolpidem  (AMBIEN ) 10 MG tablet TAKE 1 TABLET(10 MG) BY MOUTH AT BEDTIME AS NEEDED FOR SLEEP 30 tablet 1   lidocaine -prilocaine  (EMLA ) cream Apply 1 Application topically as needed (FOR INEJCTION PAIN). (Patient not taking: Reported on 08/14/2023) 30 g 0   ondansetron  (ZOFRAN -ODT) 4 MG disintegrating tablet DISSOLVE ONE TABLET BY MOUTH EVERY 6 HOURS AS NEEDED FOR NAUSEA AND VOMITING (Patient not  taking: Reported on 08/14/2023) 30 tablet 3   prochlorperazine  (COMPAZINE ) 5 MG tablet Take 1-2 tablets (5-10 mg total) by mouth every 6 (six) hours as needed for nausea or vomiting. (Patient not taking: Reported on 08/14/2023) 30 tablet 2     ROS: Constitutional: No fever or chills Vision: No changes in vision ENT: No difficulty swallowing CV: No chest pain Pulm: No SOB or wheezing GI: No nausea or vomiting GU: No urgency or inability to hold urine Skin: No poor wound healing Neurologic: No numbness or tingling Psychiatric: No depression or anxiety Heme: No bruising Allergic: No reaction to medications or food   Exam: Blood pressure (!) 140/70, pulse 91, temperature 98 F (36.7 C), temperature source Oral, resp. rate 17, height 5' 6 (1.676 m), weight 70.3 kg, SpO2 98%. General: No acute distress Orientation: Awake alert and oriented x 3 Mood and Affect: Cooperative and pleasant Gait: Unable to assess due to her fracture Coordination and balance: Within normal limits  Left lower extremity: Leg is shortened and externally rotated.  No deformity through the distal portion of the femur.  No knee effusion.  Compartments are soft compressible.  She has active dorsiflexion plantarflexion of foot and ankle with a warm well-perfused foot with 2+ DP and PT pulses.  Right lower extremity: Skin without lesions. No tenderness to palpation. Full painless ROM, full strength in each muscle groups without evidence of instability.   Medical Decision Making: Data: Imaging: X-rays are reviewed which shows a left proximal femur fracture with significant varus angulation.  Labs:  Results for orders placed or performed during the hospital encounter of 08/14/23 (from the past 24 hours)  Basic metabolic panel     Status: Abnormal   Collection Time: 08/14/23  1:09 AM  Result Value Ref Range   Sodium 136 135 - 145 mmol/L   Potassium 3.7 3.5 - 5.1 mmol/L   Chloride 105 98 - 111 mmol/L   CO2 19 (L) 22  - 32 mmol/L   Glucose, Bld 116 (H) 70 - 99 mg/dL   BUN 12 8 - 23 mg/dL   Creatinine, Ser 9.08 0.44 - 1.00  mg/dL   Calcium  9.2 8.9 - 10.3 mg/dL   GFR, Estimated >39 >39 mL/min   Anion gap 12 5 - 15  CBC with Differential     Status: Abnormal   Collection Time: 08/14/23  1:09 AM  Result Value Ref Range   WBC 20.4 (H) 4.0 - 10.5 K/uL   RBC 4.44 3.87 - 5.11 MIL/uL   Hemoglobin 14.3 12.0 - 15.0 g/dL   HCT 58.4 63.9 - 53.9 %   MCV 93.5 80.0 - 100.0 fL   MCH 32.2 26.0 - 34.0 pg   MCHC 34.5 30.0 - 36.0 g/dL   RDW 87.8 88.4 - 84.4 %   Platelets 247 150 - 400 K/uL   nRBC 0.0 0.0 - 0.2 %   Neutrophils Relative % 84 %   Neutro Abs 17.2 (H) 1.7 - 7.7 K/uL   Lymphocytes Relative 9 %   Lymphs Abs 1.7 0.7 - 4.0 K/uL   Monocytes Relative 6 %   Monocytes Absolute 1.3 (H) 0.1 - 1.0 K/uL   Eosinophils Relative 0 %   Eosinophils Absolute 0.0 0.0 - 0.5 K/uL   Basophils Relative 0 %   Basophils Absolute 0.1 0.0 - 0.1 K/uL   Immature Granulocytes 1 %   Abs Immature Granulocytes 0.10 (H) 0.00 - 0.07 K/uL  Protime-INR     Status: None   Collection Time: 08/14/23  1:09 AM  Result Value Ref Range   Prothrombin Time 13.6 11.4 - 15.2 seconds   INR 1.0 0.8 - 1.2  Type and screen [facility]     Status: None   Collection Time: 08/14/23  1:31 AM  Result Value Ref Range   ABO/RH(D) A NEG    Antibody Screen NEG    Sample Expiration      08/17/2023,2359 Performed at [facility], [address] [place], [place] [address]    *Note: Due to a large number of results and/or encounters for the requested time period, some results have not been displayed. A complete set of results can be found in Results Review.     Imaging or Labs ordered: None  Medical history and chart was reviewed and case discussed with medical provider.  Assessment/Plan: 83 year old female with left intertrochanteric femur fracture.  Due to the unstable nature of her injury I recommend proceeding with  cephalomedullary nailing of her left hip.  Risk and benefits were discussed with the patient.  Risks included but not limited to bleeding, infection, malunion, nonunion, hardware failure, heart Tatian, nerve or blood vessel injury, DVT, even the possibility anesthetic complications.  She agreed to proceed with surgery and consent was obtained.  Franky MYRTIS Light, MD [facility] [phone] (office) [email or web address]

## 2023-08-14 NOTE — Progress Notes (Signed)
 Orthopedic Tech Progress Note Patient Details:  Sara Barnes [birth year] [id number]  Patient ID: Natasha Bailiff, female   DOB: 07-19-1940, 83 y.o.   MRN: [id number] No OHF age restricted Herbie Loll 08/14/2023, 6:06 PM

## 2023-08-14 NOTE — Transfer of Care (Signed)
 Immediate Anesthesia Transfer of Care Note  Patient: Sara Barnes  Procedure(s) Performed: FIXATION, FRACTURE, INTERTROCHANTERIC, WITH INTRAMEDULLARY ROD (Left)  Patient Location: PACU  Anesthesia Type:General  Level of Consciousness: awake, alert , and oriented  Airway & Oxygen Therapy: Patient Spontanous Breathing and Patient connected to face mask oxygen  Post-op Assessment: Report given to RN and Post -op Vital signs reviewed and stable  Post vital signs: Reviewed and stable  Last Vitals:  Vitals Value Taken Time  BP 139/71 08/14/23 1351  Temp    Pulse 86 08/14/23 1353  Resp 15 08/14/23 1353  SpO2 100 % 08/14/23 1353  Vitals shown include unfiled device data.  Last Pain:  Vitals:   08/14/23 1221  TempSrc:   PainSc: 10-Worst pain ever      Patients Stated Pain Goal: 0 (08/14/23 0106)  Complications: No notable events documented.

## 2023-08-14 NOTE — Anesthesia Procedure Notes (Signed)
 Procedure Name: Intubation Date/Time: 08/14/2023 12:42 PM  Performed by: Artemisa Bile D, CRNAPre-anesthesia Checklist: Patient identified, Emergency Drugs available, Suction available and Patient being monitored Patient Re-evaluated:Patient Re-evaluated prior to induction Oxygen Delivery Method: Circle System Utilized Preoxygenation: Pre-oxygenation with 100% oxygen Induction Type: IV induction Ventilation: Mask ventilation without difficulty Laryngoscope Size: Miller and 2 Grade View: Grade I Tube type: Oral Tube size: 7.0 mm Number of attempts: 1 Airway Equipment and Method: Stylet and Oral airway Placement Confirmation: ETT inserted through vocal cords under direct vision, positive ETCO2 and breath sounds checked- equal and bilateral Secured at: 21 cm Tube secured with: Tape Dental Injury: Teeth and Oropharynx as per pre-operative assessment

## 2023-08-14 NOTE — Op Note (Signed)
 Orthopaedic Surgery Operative Note (CSN: [id number] ) Date of Surgery: 08/14/2023  Admit Date: 08/14/2023   Diagnoses: Pre-Op Diagnoses: Left intertrochanteric femur fracture  Post-Op Diagnosis: Same  Procedures: CPT 27245-Cephalomedullary nailing of left intertrochanteric femur fracture  Surgeons : Primary: Laneta Pintos, MD  Assistant: Alona Jamaica, PA-C  Location: OR 3   Anesthesia: General   Antibiotics: Ancef  2g preop   Tourniquet time: None    Estimated Blood Loss: 150 mL  Complications:* No complications entered in OR log *   Specimens:* No specimens in log *   Implants: Implant Name Type Inv. Item Serial No. Manufacturer Lot No. LRB No. Used Action  NAIL LOCK CANN 10X360 130D LT - [id number] Nail NAIL LOCK CANN 10X360 130D LT  [employer] [id number] Left 1 Implanted  SCREW LAG COMPR KIT 100/95 - [id number] Screw SCREW LAG COMPR KIT 100/95  [employer] [id number] Left 1 Implanted  SCREW TRIGEN LOW PROF 5.0X40 - [id number] Screw SCREW TRIGEN LOW PROF 5.0X40  [employer] [id number] Left 1 Implanted     Indications for Surgery: 83 year old female who sustained a ground-level fall with a left intertrochanteric femur fracture.  Due to the unstable nature of her injury I recommend proceeding with cephalomedullary nailing of the left hip.  Risks and benefits were discussed with the patient and her family.  Risks included but not limited to bleeding, infection, malunion, nonunion, hardware failure, hardware irritation, nerve and blood vessel injury, DVT, even the possibility anesthetic complications.  She agreed to proceed with surgery and consent was obtained.  Operative Findings: Cephalomedullary nail left intertrochanteric femur fracture using [employer] InterTAN 10 x 360 mm nail with a 100 mm lag screw and a 95 mm compression screw.  Procedure: The patient was identified in the preoperative holding area. Consent was  confirmed with the patient and their family and all questions were answered. The operative extremity was marked after confirmation with the patient. she was then brought back to the operating room by our anesthesia colleagues.  She was placed under general anesthetic and carefully transferred over to the [facility] table and all bony prominences were well-padded.  Traction was applied to the left lower extremity and fluoroscopic imaging showed adequate alignment of her fracture.  The left lower extremity was then prepped and draped in usual sterile fashion.  A timeout was performed to verify the patient, the procedure, and the extremity.  Preoperative antibiotics were dosed.  Small incision proximal to the greater trochanter was made and carried down through skin subcutaneous tissue.  I then directed a threaded guidewire to tip of the greater trochanter and advanced into the proximal metaphysis.  I then used an entry reamer to enter the medullary canal.  I then passed a ball-tipped guidewire down the central canal and seated it into the distal metaphysis.  I then measured the length and chose to use a 360 mm nail.  I then sequentially reamed from 9 mm to 11.5 mm.  I placed a 10 x 360 mm nail down the center canal attached to the targeting arm.  I then directed a threaded guidewire into the head/neck segment.  I confirmed adequate tip apex distance and measured the length.  I decided to use 100 mm lag screw.  I then drilled the path for the compression screw and placed an antirotation bar.  I then drilled the path for the lag screw and then placed the lag screw.  I then compressed approximately 3  mm with the compression screw.  I then statically locked the proximal portion of the nail.  I then used perfect circle technique to place a lateral to medial distal interlock screw.  The targeting arm was removed and final fluoroscopic imaging was obtained.  The incisions were irrigated and closed with #1 Vicryl, 2-0 Monocryl  and Dermabond.  Sterile dressings were applied.  The patient was then awoke from anesthesia and taken to the PACU in stable condition.  Post Op Plan/Instructions: The patient will be weightbearing as tolerated to the left lower extremity.  She will receive postoperative Ancef .  She will receive Lovenox  for DVT prophylaxis and transition to aspirin  upon discharge.  Will have her mobilize with physical and Occupational Therapy.  I was present and performed the entire surgery.  Alona Jamaica, PA-C did assist me throughout the case. An assistant was necessary given the difficulty in approach, maintenance of reduction and ability to instrument the fracture.   Katheryne Pane, MD [facility]

## 2023-08-15 DIAGNOSIS — S72142A Displaced intertrochanteric fracture of left femur, initial encounter for closed fracture: Secondary | ICD-10-CM | POA: Diagnosis not present

## 2023-08-15 DIAGNOSIS — R739 Hyperglycemia, unspecified: Secondary | ICD-10-CM | POA: Diagnosis not present

## 2023-08-15 LAB — CBC
HCT: 33.3 % — ABNORMAL LOW (ref 36.0–46.0)
Hemoglobin: 11.1 g/dL — ABNORMAL LOW (ref 12.0–15.0)
MCH: 31.6 pg (ref 26.0–34.0)
MCHC: 33.3 g/dL (ref 30.0–36.0)
MCV: 94.9 fL (ref 80.0–100.0)
Platelets: 167 10*3/uL (ref 150–400)
RBC: 3.51 MIL/uL — ABNORMAL LOW (ref 3.87–5.11)
RDW: 12.3 % (ref 11.5–15.5)
WBC: 11.5 10*3/uL — ABNORMAL HIGH (ref 4.0–10.5)
nRBC: 0 % (ref 0.0–0.2)

## 2023-08-15 LAB — BASIC METABOLIC PANEL WITH GFR
Anion gap: 9 (ref 5–15)
BUN: 15 mg/dL (ref 8–23)
CO2: 21 mmol/L — ABNORMAL LOW (ref 22–32)
Calcium: 8.7 mg/dL — ABNORMAL LOW (ref 8.9–10.3)
Chloride: 100 mmol/L (ref 98–111)
Creatinine, Ser: 1.04 mg/dL — ABNORMAL HIGH (ref 0.44–1.00)
GFR, Estimated: 54 mL/min — ABNORMAL LOW (ref >60)
Glucose, Bld: 114 mg/dL — ABNORMAL HIGH (ref 70–99)
Potassium: 3.9 mmol/L (ref 3.5–5.1)
Sodium: 130 mmol/L — ABNORMAL LOW (ref 135–145)

## 2023-08-15 MED ORDER — DIPHENOXYLATE-ATROPINE 2.5-0.025 MG PO TABS
2.0000 | ORAL_TABLET | Freq: Four times a day (QID) | ORAL | Status: DC | PRN
  Administered 2023-08-18 – 2023-08-19 (×2): 2 via ORAL
  Filled 2023-08-15 (×2): qty 2

## 2023-08-15 NOTE — Evaluation (Signed)
 Physical Therapy Evaluation Patient Details Name: Sara Barnes MRN: [id number] DOB: June 14, 1940 Today's Date: 08/15/2023  History of Present Illness  83 y.o. female presents to [facility] hospital on 08/14/2023 after fall and subsequent L hip fx. Pt underwent IM nailing on 4/18. PMH includes afib, breast CA, crohn's disease, HLD.  Clinical Impression  Pt presents to PT with deficits in functional mobility, gait, balance, strength, power, endurance. Pt is able to perofrm bed mobility and transfer with physical assistance of PT. Ambulation tolerance is significantly limited at this time by LLE pain with mobilization. Pt is very active and independent at baseline. Patient will benefit from intensive inpatient follow-up therapy, >3 hours/day.        If plan is discharge home, recommend the following: A little help with walking and/or transfers;A lot of help with bathing/dressing/bathroom;Assistance with cooking/housework;Assist for transportation;Help with stairs or ramp for entrance   Can travel by private vehicle        Equipment Recommendations BSC/3in1;Wheelchair (measurements PT)  Recommendations for Other Services  Rehab consult    Functional Status Assessment Patient has had a recent decline in their functional status and demonstrates the ability to make significant improvements in function in a reasonable and predictable amount of time.     Precautions / Restrictions Precautions Precautions: Fall Recall of Precautions/Restrictions: Intact Restrictions Weight Bearing Restrictions Per Provider Order: Yes LLE Weight Bearing Per Provider Order: Weight bearing as tolerated      Mobility  Bed Mobility Overal bed mobility: Needs Assistance Bed Mobility: Supine to Sit     Supine to sit: Min assist, HOB elevated, Used rails          Transfers Overall transfer level: Needs assistance Equipment used: Rolling walker (2 wheels) Transfers: Sit to/from Stand Sit to Stand: Min assist            General transfer comment: verbal cues for hand placement and increased trunk flexion    Ambulation/Gait Ambulation/Gait assistance: Min assist Gait Distance (Feet): 6 Feet Assistive device: Rolling walker (2 wheels) Gait Pattern/deviations: Step-to pattern Gait velocity: reduced Gait velocity interpretation: <1.31 ft/sec, indicative of household ambulator   General Gait Details: slowed step-to gait, reduced stance time on LLE. verbal cues for gait sequencing  Stairs            Wheelchair Mobility     Tilt Bed    Modified Rankin (Stroke Patients Only)       Balance Overall balance assessment: Needs assistance Sitting-balance support: No upper extremity supported, Feet supported Sitting balance-Leahy Scale: Good     Standing balance support: Bilateral upper extremity supported, Reliant on assistive device for balance Standing balance-Leahy Scale: Poor                               Pertinent Vitals/Pain Pain Assessment Pain Assessment: 0-10 Pain Score: 7  Pain Location: LLE Pain Descriptors / Indicators: Sore Pain Intervention(s): Monitored during session    Home Living Family/patient expects to be discharged to:: Private residence Living Arrangements: Alone   Type of Home: House Home Access: Level entry     Alternate Level Stairs-Number of Steps: flight Home Layout: Able to live on main level with bedroom/bathroom;Two level (garden tub downstairs, tub shower upstairs) Home Equipment: Cane - single Librarian, academic (2 wheels);Shower seat;Rollator (4 wheels)      Prior Function Prior Level of Function : Independent/Modified Independent;Driving  Mobility Comments: ambulatory without DME       Extremity/Trunk Assessment   Upper Extremity Assessment Upper Extremity Assessment: Overall WFL for tasks assessed    Lower Extremity Assessment Lower Extremity Assessment: Generalized weakness    Cervical /  Trunk Assessment Cervical / Trunk Assessment: Normal  Communication   Communication Communication: No apparent difficulties    Cognition Arousal: Alert Behavior During Therapy: WFL for tasks assessed/performed   PT - Cognitive impairments: No apparent impairments                         Following commands: Intact       Cueing Cueing Techniques: Verbal cues     General Comments General comments (skin integrity, edema, etc.): VSS on RA, tachycardia into 110s    Exercises     Assessment/Plan    PT Assessment Patient needs continued PT services  PT Problem List Decreased strength;Decreased activity tolerance;Decreased balance;Decreased mobility;Decreased knowledge of use of DME;Pain       PT Treatment Interventions DME instruction;Gait training;Functional mobility training;Stair training;Therapeutic activities;Therapeutic exercise;Balance training;Neuromuscular re-education;Patient/family education    PT Goals (Current goals can be found in the Care Plan section)  Acute Rehab PT Goals Patient Stated Goal: to regain independence PT Goal Formulation: With patient Time For Goal Achievement: 08/29/23 Potential to Achieve Goals: Good    Frequency Min 3X/week     Co-evaluation               AM-PAC PT "6 Clicks" Mobility  Outcome Measure Help needed turning from your back to your side while in a flat bed without using bedrails?: A Little Help needed moving from lying on your back to sitting on the side of a flat bed without using bedrails?: A Little Help needed moving to and from a bed to a chair (including a wheelchair)?: A Little Help needed standing up from a chair using your arms (e.g., wheelchair or bedside chair)?: A Little Help needed to walk in hospital room?: A Little Help needed climbing 3-5 steps with a railing? : Total 6 Click Score: 16    End of Session Equipment Utilized During Treatment: Gait belt Activity Tolerance: Patient tolerated  treatment well Patient left: in chair;with call bell/phone within reach;with chair alarm set Nurse Communication: Mobility status PT Visit Diagnosis: Other abnormalities of gait and mobility (R26.89);Muscle weakness (generalized) (M62.81);History of falling (Z91.81)    Time: 4098-1191 PT Time Calculation (min) (ACUTE ONLY): 41 min   Charges:   PT Evaluation $PT Eval Low Complexity: 1 Low   PT General Charges $$ ACUTE PT VISIT: 1 Visit         Rexie Catena, PT, DPT Acute Rehabilitation Office [phone]   Rexie Catena 08/15/2023, 12:56 PM

## 2023-08-15 NOTE — Progress Notes (Signed)
 OT Cancellation Note  Patient Details Name: Sara Barnes MRN: [id number] DOB: [birth year]   Cancelled Treatment:    Reason Eval/Treat Not Completed: (P) Patient declined, states she has had a long day and she felt like it took a while for someone to come help her get up from recliner, did not have purewick placed and needed help with hygiene/clean up. Pt was resting comfortably in bed and finally eating her lunch from earlier and asked for OT to return another day. Spoke in detail with Pt about concerns, using active listening and ensuring all needs were met. Pt c/o some pain and requested meds, RN informed. Will return tomorrow as time allows.   Scherry Curtis 08/15/2023, 3:26 PM

## 2023-08-15 NOTE — Progress Notes (Signed)
    Subjective: Patient reports pain as moderate. Lost her IV so can only get PO meds now. Tolerating diet.  Urinating.   No CP, SOB.  Has mobilized very small amount OOB with PT and declined working with OT today. Upset that she was left in the bedside chair so long that she peed on herself.   Objective:   VITALS:   Vitals:   08/15/23 0450 08/15/23 0524 08/15/23 0719 08/15/23 1421  BP: 114/72  112/68 125/74  Pulse: 77  79 85  Resp: 18  15 17   Temp: 97.6 F (36.4 C)  97.7 F (36.5 C) 98.2 F (36.8 C)  TempSrc: Oral  Oral Oral  SpO2: 97%  96% 98%  Weight:  77.2 kg    Height:          Latest Ref Rng & Units 08/15/2023    6:19 AM 08/14/2023    5:51 PM 08/14/2023    1:09 AM  CBC  WBC 4.0 - 10.5 K/uL 11.5  15.1  20.4   Hemoglobin 12.0 - 15.0 g/dL 16.1  09.6  04.5   Hematocrit 36.0 - 46.0 % 33.3  39.3  41.5   Platelets 150 - 400 K/uL 167  213  247       Latest Ref Rng & Units 08/15/2023    6:19 AM 08/14/2023    5:51 PM 08/14/2023    1:09 AM  BMP  Glucose 70 - 99 mg/dL 409   811   BUN 8 - 23 mg/dL 15   12   Creatinine 9.14 - 1.00 mg/dL 7.82  9.56  2.13   Sodium 135 - 145 mmol/L 130   136   Potassium 3.5 - 5.1 mmol/L 3.9   3.7   Chloride 98 - 111 mmol/L 100   105   CO2 22 - 32 mmol/L 21   19   Calcium  8.9 - 10.3 mg/dL 8.7   9.2    Intake/Output      04/18 0701 04/19 0700 04/19 0701 04/20 0700   I.V. (mL/kg) 305 (4)    IV Piggyback 100    Total Intake(mL/kg) 405 (5.2)    Stool 400    Total Output 400    Net +5            Physical Exam: General: NAD.  Laying in bed, calm, comfortable Resp: No increased wob Cardio: regular rate and rhythm ABD soft Neurologically intact MSK Neurovascularly intact Sensation intact distally Intact pulses distally Dorsiflexion/Plantar flexion intact Incision: dressings C/D/I   Assessment: 1 Day Post-Op  S/P Procedure(s) (LRB): FIXATION, FRACTURE, INTERTROCHANTERIC, WITH INTRAMEDULLARY ROD (Left) by Dr. Curtiss Dowdy on  08/14/23  Principal Problem:   Closed left hip fracture (HCC)   Plan:  Up with therapy Incentive Spirometry Elevate and Apply ice  Weightbearing: WBAT LLE Insicional and dressing care: Reinforce dressings as needed Orthopedic device(s): None Showering: Keep dressing dry VTE prophylaxis:  Lovenox  while inpatient, can switch to ASA 81mg  bid x 30 days upon d/c  , SCDs, ambulation Pain control: PRN Follow - up plan: 2 weeks in office with Dr. Ephriam Hashimoto information for today:  Randal Bury MD, Marzella Solan PA-C  Dispo:  TBD. Only had 1 PT session so far that was very limited by pain.     Lore Rode, PA-C Office [phone] 08/15/2023, 3:40 PM

## 2023-08-15 NOTE — Progress Notes (Signed)
  Progress Note   Patient: Sara Barnes XBJ:[id number] DOB: 12-Mar-1941 DOA: 08/14/2023     1 DOS: the patient was seen and examined on 08/15/2023   Brief hospital course: 83 y.o. female with medical history significant of afib not on anticoagulation, hx breast cancer followed by Oncology, crohn's disease who presents to ED following mechanical fall resulting in L hip pain. Pt reported being startled by an arthropod overnight which resulted in pt flinching back and causing her to lose her balance. Pt then fell, resulting in L hip pain.    In the ED,  head and neck CT was found to be unremarkable. Xray confirmed acute L hip fracture. Orthopedic Surgery consulted. Hospitalist consulted for consideration for admission. On further questioning, pt denied florid syncope. Does continue to complain of uncontrolled hip pain despite 0.5mg  doses of dialudid this AM.   Assessment and Plan: L hip fracture  -s/p mechanical fall, fracture confirmed on xray hip -Orthopedic Surgery following. Pt is now s/p surgery 4/18 -Continue analgesia as needed -PT/OT following, thus far recs for inpt rehab. CIR consulted   Hx Crohn's disease -stable -Follows gastroenterologist, Dr. Bridgett Camps as outpatient -will change imodium  to lomotil   -continued on daily prednisone   History of metastatic breast cancer -Patient noted to have mets to the chest wall which has been excised. -Known to have mets to bone, liver, hx malignant pleural effusion -Follows oncology.  Hx Paroxysmal atrial fibrillation -Currently rate controlled -Not a candidate for anticoagulation due to hx of intra-abdominal hematoma. -most recent 2D echo from 2022 with normal EF.  TSH from 12/22 of 1.286      Subjective: Reports pain is better since surgery. Still having discomfort with movement, however, and asking for pain regimen prior to working with PT  Physical Exam: Vitals:   08/15/23 0450 08/15/23 0524 08/15/23 0719 08/15/23 1421  BP: 114/72   112/68 125/74  Pulse: 77  79 85  Resp: 18  15 17   Temp: 97.6 F (36.4 C)  97.7 F (36.5 C) 98.2 F (36.8 C)  TempSrc: Oral  Oral Oral  SpO2: 97%  96% 98%  Weight:  77.2 kg    Height:       General exam: Awake, laying in bed, in nad Respiratory system: Normal respiratory effort, no wheezing Cardiovascular system: regular rate, s1, s2 Gastrointestinal system: Soft, nondistended, positive BS Central nervous system: CN2-12 grossly intact, strength intact Extremities: Perfused, no clubbing Skin: Normal skin turgor, no notable skin lesions seen Psychiatry: Mood normal // no visual hallucinations   Data Reviewed:  Labs reviewed: Na 130, K 3.9, Cr 1.04, WBC 11.5, Hgb 11.1, Plts 167  Family Communication: Pt in room, family at bedside  Disposition: Status is: Inpatient Remains inpatient appropriate because: Severity of illness  Planned Discharge Destination: Rehab    Author: Cherylle Corwin, MD 08/15/2023 3:45 PM  For on call review [email or web address].

## 2023-08-15 NOTE — Hospital Course (Signed)
 83 y.o. female with medical history significant of afib not on anticoagulation, hx breast cancer followed by Oncology, crohn's disease who presents to ED following mechanical fall resulting in L hip pain. Pt reported being startled by an arthropod overnight which resulted in pt flinching back and causing her to lose her balance. Pt then fell, resulting in L hip pain.    In the ED,  head and neck CT was found to be unremarkable. Xray confirmed acute L hip fracture. Orthopedic Surgery consulted. Hospitalist consulted for consideration for admission. On further questioning, pt denied florid syncope. Does continue to complain of uncontrolled hip pain despite 0.5mg  doses of dialudid this AM.

## 2023-08-16 DIAGNOSIS — R739 Hyperglycemia, unspecified: Secondary | ICD-10-CM | POA: Diagnosis not present

## 2023-08-16 DIAGNOSIS — S72142A Displaced intertrochanteric fracture of left femur, initial encounter for closed fracture: Secondary | ICD-10-CM | POA: Diagnosis not present

## 2023-08-16 LAB — COMPREHENSIVE METABOLIC PANEL WITH GFR
ALT: 8 U/L (ref 0–44)
AST: 20 U/L (ref 15–41)
Albumin: 2.4 g/dL — ABNORMAL LOW (ref 3.5–5.0)
Alkaline Phosphatase: 40 U/L (ref 38–126)
Anion gap: 6 (ref 5–15)
BUN: 20 mg/dL (ref 8–23)
CO2: 23 mmol/L (ref 22–32)
Calcium: 8.6 mg/dL — ABNORMAL LOW (ref 8.9–10.3)
Chloride: 103 mmol/L (ref 98–111)
Creatinine, Ser: 1.05 mg/dL — ABNORMAL HIGH (ref 0.44–1.00)
GFR, Estimated: 53 mL/min — ABNORMAL LOW (ref >60)
Glucose, Bld: 100 mg/dL — ABNORMAL HIGH (ref 70–99)
Potassium: 4.3 mmol/L (ref 3.5–5.1)
Sodium: 132 mmol/L — ABNORMAL LOW (ref 135–145)
Total Bilirubin: 0.8 mg/dL (ref 0.0–1.2)
Total Protein: 5 g/dL — ABNORMAL LOW (ref 6.5–8.1)

## 2023-08-16 LAB — CBC
HCT: 29 % — ABNORMAL LOW (ref 36.0–46.0)
Hemoglobin: 9.9 g/dL — ABNORMAL LOW (ref 12.0–15.0)
MCH: 32.4 pg (ref 26.0–34.0)
MCHC: 34.1 g/dL (ref 30.0–36.0)
MCV: 94.8 fL (ref 80.0–100.0)
Platelets: 151 10*3/uL (ref 150–400)
RBC: 3.06 MIL/uL — ABNORMAL LOW (ref 3.87–5.11)
RDW: 12.3 % (ref 11.5–15.5)
WBC: 10.3 10*3/uL (ref 4.0–10.5)
nRBC: 0 % (ref 0.0–0.2)

## 2023-08-16 NOTE — Progress Notes (Signed)
 Inpatient Rehab Admissions:  Inpatient Rehab Consult received.  I met with patient and her sister at the bedside for rehabilitation assessment and to discuss goals and expectations of an inpatient rehab admission.  Discussed average length of stay, insurance authorization requirement and discharge home after completion of CIR. Both acknowledged understanding. Pt interested in pursuing CIR. [name] is supportive. [name] confirmed that she will be able to provide 24/7 support for pt after discharge. Will continue to follow.  Signed: Artemus Larsen, MS, CCC-SLP Admissions Coordinator [phone]

## 2023-08-16 NOTE — Evaluation (Signed)
 Occupational Therapy Evaluation Patient Details Name: Sara Barnes MRN: [id number] DOB: 1941/01/11 Today's Date: 08/16/2023   History of Present Illness   83 y.o. female presents to [facility] hospital on 08/14/2023 after fall and subsequent L hip fx. Pt underwent IM nailing on 4/18. PMH includes afib, breast CA, crohn's disease, HLD.     Clinical Impressions Patient admitted for the diagnosis and procedure above.  PTA she lives at home alone, but has excellent support from family as needed.  Patient was independent with ADL and mobility at home, with occasional assist with iADL from sister.  Currently she is limited by pain to her L leg, but remains very motivated.  Patient is needing Min A for in room mobility and closer to Max A for lower body ADL from a sit to stand level.  Patient has had multiple medical conditions over the last few years, and would benefit from aggressive rehab.  Patient demonstrates the ability to tolerate a higher level of rehab, and should reach a Mod I level prior to returning home.  OT will follow in the acute setting, and Patient will benefit from intensive inpatient follow-up therapy, >3 hours/day.     If plan is discharge home, recommend the following:   Assist for transportation;A lot of help with bathing/dressing/bathroom;A little help with walking and/or transfers;Help with stairs or ramp for entrance;Assistance with cooking/housework     Functional Status Assessment   Patient has had a recent decline in their functional status and demonstrates the ability to make significant improvements in function in a reasonable and predictable amount of time.     Equipment Recommendations   BSC/3in1;Tub/shower seat     Recommendations for Other Services         Precautions/Restrictions   Precautions Precautions: Fall Recall of Precautions/Restrictions: Intact Restrictions Weight Bearing Restrictions Per Provider Order: Yes LLE Weight Bearing Per Provider  Order: Weight bearing as tolerated     Mobility Bed Mobility               General bed mobility comments: sitting BSC upon entering    Transfers Overall transfer level: Needs assistance Equipment used: Rolling walker (2 wheels) Transfers: Sit to/from Stand, Bed to chair/wheelchair/BSC Sit to Stand: Min assist     Step pivot transfers: Min assist            Balance Overall balance assessment: Needs assistance Sitting-balance support: No upper extremity supported, Feet supported Sitting balance-Leahy Scale: Good     Standing balance support: Bilateral upper extremity supported, Reliant on assistive device for balance Standing balance-Leahy Scale: Poor                             ADL either performed or assessed with clinical judgement   ADL Overall ADL's : Needs assistance/impaired Eating/Feeding: Independent;Sitting   Grooming: Set up;Sitting   Upper Body Bathing: Supervision/ safety;Sitting   Lower Body Bathing: Moderate assistance;Sit to/from stand   Upper Body Dressing : Sitting;Minimal assistance   Lower Body Dressing: Maximal assistance;Sit to/from stand   Toilet Transfer: Minimal assistance;BSC/3in1;Stand-pivot                   Vision Patient Visual Report: No change from baseline       Perception Perception: Not tested       Praxis Praxis: Not tested       Pertinent Vitals/Pain Pain Assessment Pain Assessment: Faces Faces Pain Scale: Hurts little more Pain Location:  LLE Pain Descriptors / Indicators: Aching, Tender Pain Intervention(s): Premedicated before session     Extremity/Trunk Assessment Upper Extremity Assessment Upper Extremity Assessment: Overall WFL for tasks assessed   Lower Extremity Assessment Lower Extremity Assessment: Defer to PT evaluation   Cervical / Trunk Assessment Cervical / Trunk Assessment: Normal   Communication Communication Communication: No apparent difficulties   Cognition  Arousal: Alert Behavior During Therapy: WFL for tasks assessed/performed Cognition: No apparent impairments                               Following commands: Intact       Cueing  General Comments   Cueing Techniques: Verbal cues   VSS on RA   Exercises     Shoulder Instructions      Home Living Family/patient expects to be discharged to:: Private residence Living Arrangements: Alone Available Help at Discharge: Family;Available 24 hours/day Type of Home: House Home Access: Level entry     Home Layout: Able to live on main level with bedroom/bathroom;Two level Alternate Level Stairs-Number of Steps: flight Alternate Level Stairs-Rails: Can reach both Bathroom Shower/Tub: Tub/shower unit;Tub only   Firefighter: Standard Bathroom Accessibility: Yes How Accessible: Accessible via walker Home Equipment: Cane - single point;Rolling Walker (2 wheels);Shower seat;Rollator (4 wheels)          Prior Functioning/Environment Prior Level of Function : Independent/Modified Independent;Driving             Mobility Comments: ambulatory without DME ADLs Comments: Ind with ADL, occasional assist from sister for iADL    OT Problem List: Decreased strength;Decreased range of motion;Decreased activity tolerance;Impaired balance (sitting and/or standing);Pain;Increased edema   OT Treatment/Interventions: Self-care/ADL training;Therapeutic exercise;Therapeutic activities;DME and/or AE instruction;Patient/family education;Balance training      OT Goals(Current goals can be found in the care plan section)   Acute Rehab OT Goals Patient Stated Goal: Return home OT Goal Formulation: With patient Time For Goal Achievement: 08/28/23 Potential to Achieve Goals: Good ADL Goals Pt Will Perform Grooming: with supervision;standing Pt Will Perform Lower Body Dressing: with min assist;sit to/from stand;with adaptive equipment Pt Will Transfer to Toilet: with  supervision;ambulating;regular height toilet   OT Frequency:  Min 2X/week    Co-evaluation              AM-PAC OT "6 Clicks" Daily Activity     Outcome Measure Help from another person eating meals?: None Help from another person taking care of personal grooming?: None Help from another person toileting, which includes using toliet, bedpan, or urinal?: A Lot Help from another person bathing (including washing, rinsing, drying)?: A Lot Help from another person to put on and taking off regular upper body clothing?: A Little Help from another person to put on and taking off regular lower body clothing?: A Lot 6 Click Score: 17   End of Session Equipment Utilized During Treatment: Gait belt;Rolling walker (2 wheels) Nurse Communication: Mobility status  Activity Tolerance: Patient tolerated treatment well Patient left: in chair;with call bell/phone within reach;with chair alarm set;with family/visitor present  OT Visit Diagnosis: Unsteadiness on feet (R26.81);Muscle weakness (generalized) (M62.81);Pain Pain - Right/Left: Left Pain - part of body: Leg;Hip                Time: 1203-1230 OT Time Calculation (min): 27 min Charges:  OT General Charges $OT Visit: 1 Visit OT Evaluation $OT Eval Moderate Complexity: 1 Mod OT Treatments $Self Care/Home Management : 8-22  mins  08/16/2023  RP, OTR/L  Acute Rehabilitation Services  Office:  [phone]   Benjamen Brand 08/16/2023, 12:55 PM

## 2023-08-16 NOTE — PMR Pre-admission (Shared)
 PMR Admission Coordinator Pre-Admission Assessment  Patient: Sara Barnes is an 83 y.o., female MRN: [id number] DOB: 13-May-1940 Height: 5\' 6"  (167.6 cm) Weight: 81.1 kg              Insurance Information HMO: yes    PPO:      PCP:      IPA:      80/20:      OTHER:  PR[employer] HMO      Policy#: [id number]      Subscriber: patient CM Name: ***      Phone#: ***     Fax#: [phone] Pre-Cert#: [id number]      Employer:  Benefits:  Phone #: n/a-online at [email or web address]     Name:  Eff. Date: 04/29/23-04/27/24     Deduct: does not have one      Out of Pocket Max: $6,750 ($479.17 met)      Life Max: NA  CIR: $399/day co-pay with a max co-pay of $2,793/admission      SNF: $10/day co-pay for days 1-20, $214/day co-pay for days 21-100 Outpatient: $25 co-pay/visit     Co-Pay:  Home Health: 100% coverage      Co-Pay:  DME: 80% coverage     Co-Pay: 20% co-insurance Providers: in-network SECONDARY:       Policy#:       Phone#:   Artist:       Phone#:   The Data processing manager" for patients in Inpatient Rehabilitation Facilities with attached "Privacy Act Statement-Health Care Records" was provided and verbally reviewed with: {CHL IP Patient Family EX:[id number]}  Emergency Contact Information Contact Information     Name Relation Home Work Mobile   Darnell,Forrest Son [phone]  [phone]   Joannie Muff   [phone]      Other Contacts   None on File    Current Medical History  Patient Admitting Diagnosis: Left hip fracture History of Present Illness: Pt is an 83 year old female with medical hx significant for: inflammatory bowel disease, metastatic breast CA, crohn's disease, A-fib. Pt presented to [facility] after a fall at home. Pt injured left hip and hit her head. X-ray showed left intertrochanteric hip fracture. CT head and cervical spine showed evidence of scalp hematoma. Chest x-ray unremarkable. Orthopedics  consulted and recommended left hip nail. Pt underwent left hip IM Nail on 08/14/23 by Dr. Curtiss Dowdy. Therapy evaluations completed and CIR recommended d/t pt's functional mobility deficits.       Patient's medical record from [facility] has been reviewed by the rehabilitation admission coordinator and physician.  Past Medical History  Past Medical History:  Diagnosis Date   Allergy    Anemia    Anxiety    Arthritis    Basal cell carcinoma    Blood transfusion without reported diagnosis    Bowel perforation (HCC)    Cancer of lower-inner quadrant of female breast (HCC) 11/30/2014   left   Cancer of lower-inner quadrant of left female breast (HCC) 11/30/2014   Clostridium difficile infection    Complication of anesthesia    anxiety post-op after ear surgeries   Crohn's disease of large intestine with abscess (HCC)    Dental crowns present    recent root canal 11/2014   Depression    Family history of adverse reaction to anesthesia    pt's sister has hx. 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   Seasonal allergies    TMJ (temporomandibular joint syndrome)    Ulcerative colitis    Vitamin B12 deficiency     Has the patient had major surgery during 100 days prior to admission? Yes  Family History  family history includes Anesthesia problems in her sister; Asthma in her sister; Brain cancer in her father; Diabetes in her brother; Emphysema in her maternal grandfather; Kidney disease in her brother; Prostate cancer (age of onset: 75) in her brother; Rheum arthritis in her brother and mother.   Current Medications   Current Facility-Administered Medications:    acetaminophen   (TYLENOL ) tablet 650 mg, 650 mg, Oral, Q6H, 650 mg at 08/14/23 1718 **OR** acetaminophen  (TYLENOL ) suppository 650 mg, 650 mg, Rectal, Q6H, McClung, Sarah A, PA-C   alum & mag hydroxide-simeth (MAALOX/MYLANTA) 200-200-20 MG/5ML suspension 30 mL, 30 mL, Oral, Q4H PRN, Oral Billings, MD, 30 mL at 08/17/23 1054   cholecalciferol  (VITAMIN D3) 25 MCG (1000 UNIT) tablet 1,000 Units, 1,000 Units, Oral, Daily, Versie Gores, PA-C, 1,000 Units at 08/17/23 0802   diphenoxylate -atropine  (LOMOTIL ) 2.5-0.025 MG per tablet 2 tablet, 2 tablet, Oral, QID PRN, Oral Billings, MD   docusate sodium  (COLACE) capsule 100 mg, 100 mg, Oral, BID, Versie Gores, PA-C, 100 mg at 08/17/23 0801   enoxaparin  (LOVENOX ) injection 40 mg, 40 mg, Subcutaneous, Q24H, McClung, Sarah A, PA-C, 40 mg at 08/17/23 0800   FLUoxetine  (PROZAC ) capsule 20 mg, 20 mg, Oral, Daily, Versie Gores, PA-C, 20 mg at 08/17/23 0802   HYDROmorphone  (DILAUDID ) injection 0.5-1 mg, 0.5-1 mg, Intravenous, Q4H PRN, Versie Gores, PA-C, 1 mg at 08/17/23 1610   LORazepam  (ATIVAN ) injection 0.5 mg, 0.5 mg, Intravenous, Q6H PRN, Versie Gores, PA-C, 0.5 mg at 08/14/23 0341   methocarbamol  (ROBAXIN ) tablet 500 mg, 500 mg, Oral, Q6H PRN, 500 mg at 08/17/23 1439 **OR** methocarbamol  (ROBAXIN ) injection 500 mg, 500 mg, Intravenous, Q6H PRN, McClung, Sarah A, PA-C   metoCLOPramide  (REGLAN ) tablet 5-10 mg, 5-10 mg, Oral, Q8H PRN **OR** metoCLOPramide  (REGLAN ) injection 5-10 mg, 5-10 mg, Intravenous, Q8H PRN, McClung, Sarah A, PA-C   ondansetron  (ZOFRAN ) tablet 4 mg, 4 mg, Oral, Q6H PRN **OR** ondansetron  (ZOFRAN ) injection 4 mg, 4 mg, Intravenous, Q6H PRN, McClung, Sarah A, PA-C   polyethylene glycol (MIRALAX  / GLYCOLAX ) packet 17 g, 17 g, Oral, Daily, McClung, Sarah A, PA-C   predniSONE  (DELTASONE ) tablet 5 mg, 5 mg, Oral, Daily, McClung, Sarah A, PA-C, 5 mg at 08/17/23 0801   simethicone  (MYLICON) chewable tablet 80 mg, 80 mg, Oral, QID PRN, Oral Billings, MD   sodium chloride  flush (NS) 0.9 % injection 3-10 mL, 3-10 mL, Intravenous, Q12H, McClung, Sarah A, PA-C, 10 mL at 08/17/23 0803   sodium chloride  flush (NS) 0.9 % injection 3-10 mL, 3-10 mL, Intravenous, PRN, Versie Gores, PA-C   traMADol  (ULTRAM ) tablet 50-100 mg, 50-100 mg, Oral, Q6H PRN, Versie Gores, PA-C, 100 mg at 08/17/23 1439   zolpidem  (AMBIEN ) tablet 10 mg, 10 mg, Oral, QHS PRN, Versie Gores, PA-C, 10 mg at 08/16/23 2103  Patients Current Diet:  Diet Order  Diet regular Room service appropriate? Yes; Fluid consistency: Thin  Diet effective now                   Precautions / Restrictions Precautions Precautions: Fall Restrictions Weight Bearing Restrictions Per Provider Order: Yes LLE Weight Bearing Per Provider Order: Weight bearing as tolerated   Has the patient had 2 or more falls or a fall with injury in the past year?Yes  Prior Activity Level Limited Community (1-2x/wk): gets out of house ~2 days/week; drives  Prior Functional Level Prior Function Prior Level of Function : Independent/Modified Independent, Driving Mobility Comments: ambulatory without DME ADLs Comments: Ind with ADL, occasional assist from sister for iADL  Self Care: Did the patient need help bathing, dressing, using the toilet or eating?  Independent  Indoor Mobility: Did the patient need assistance with walking from room to room (with or without device)? Independent  Stairs: Did the patient need assistance with internal or external stairs (with or without device)? Independent  Functional Cognition: Did the patient need help planning regular tasks such as shopping or remembering to take medications? Independent  Patient Information Are you of Hispanic, Latino/a,or Spanish origin?: A. No, not of Hispanic, Latino/a, or Spanish origin What is your race?: A. White Do you need or want an interpreter to communicate with a doctor or health care staff?: 0.  No  Patient's Response To:  Health Literacy and Transportation Is the patient able to respond to health literacy and transportation needs?: Yes Health Literacy - How often do you need to have someone help you when you read instructions, pamphlets, or other written material from your doctor or pharmacy?: Never In the past 12 months, has lack of transportation kept you from medical appointments or from getting medications?: No In the past 12 months, has lack of transportation kept you from meetings, work, or from getting things needed for daily living?: No  Journalist, newspaper / Equipment Home Equipment: Cane - single point, Agricultural consultant (2 wheels), Shower seat, Rollator (4 wheels)  Prior Device Use: Indicate devices/aids used by the patient prior to current illness, exacerbation or injury? None of the above  Current Functional Level Cognition  Orientation Level: Oriented X4, Oriented to person, Oriented to place, Oriented to time, Oriented to situation    Extremity Assessment (includes Sensation/Coordination)  Upper Extremity Assessment: Overall WFL for tasks assessed  Lower Extremity Assessment: Defer to PT evaluation    ADLs  Overall ADL's : Needs assistance/impaired Eating/Feeding: Independent, Sitting Grooming: Set up, Sitting Upper Body Bathing: Supervision/ safety, Sitting Lower Body Bathing: Moderate assistance, Sit to/from stand Upper Body Dressing : Sitting, Minimal assistance Lower Body Dressing: Maximal assistance, Sit to/from stand Toilet Transfer: Minimal assistance, BSC/3in1, Stand-pivot    Mobility  Overal bed mobility: Needs Assistance Bed Mobility: Supine to Sit Supine to sit: Min assist General bed mobility comments: Assist to initiate movement of LLE, but pt able to maneuver to edge of bed and sit up trunk    Transfers  Overall transfer level: Needs assistance Equipment used: Rolling walker (2 wheels) Transfers: Sit to/from Stand Sit to Stand: Min  assist Bed to/from chair/wheelchair/BSC transfer type:: Step pivot Step pivot transfers: Min assist General transfer comment: MinA to power up to standing position, cues for hand placement    Ambulation / Gait / Stairs / Wheelchair Mobility  Ambulation/Gait Ambulation/Gait assistance: Editor, commissioning (Feet): 75 Feet Assistive device: Rolling walker (2 wheels) Gait Pattern/deviations: Step-to pattern, Decreased stance time - left, Decreased  dorsiflexion - left, Decreased weight shift to left General Gait Details: Verbal cues for sequencing/technique, walker use. MinA for balance, particularly with turns or with fatigue with increased distance. Chair follow utilized Gait velocity: decreased Gait velocity interpretation: <1.8 ft/sec, indicate of risk for recurrent falls    Posture / Balance Balance Overall balance assessment: Needs assistance Sitting-balance support: No upper extremity supported, Feet supported Sitting balance-Leahy Scale: Good Standing balance support: Bilateral upper extremity supported, Reliant on assistive device for balance Standing balance-Leahy Scale: Poor    Special needs/care consideration Skin Ecchymosis: arm/bilateral; Surgical Incision: hip/left and External Urinary Catheter     Previous Home Environment (from acute therapy documentation) Living Arrangements: Alone Available Help at Discharge: Family, Available 24 hours/day Type of Home: House (townhouse) Home Layout: Able to live on main level with bedroom/bathroom Alternate Level Stairs-Rails: Can reach both Alternate Level Stairs-Number of Steps: flight Home Access: Stairs to enter Entrance Stairs-Rails: None Entrance Stairs-Number of Steps: 4-5 Bathroom Shower/Tub: Engineer, manufacturing systems: Standard Bathroom Accessibility: Yes How Accessible: Accessible via walker Home Care Services: No  Discharge Living Setting Plans for Discharge Living Setting: Patient's home Type of Home at  Discharge: House (townhouse) Discharge Home Layout: Able to live on main level with bedroom/bathroom Discharge Home Access: Stairs to enter Entrance Stairs-Rails: None Entrance Stairs-Number of Steps: 4-5 Discharge Bathroom Shower/Tub: Tub/shower unit Discharge Bathroom Toilet: Standard Discharge Bathroom Accessibility: Yes How Accessible: Accessible via walker Does the patient have any problems obtaining your medications?: No  Social/Family/Support Systems Anticipated Caregiver: Dal Dubin, sister Anticipated Caregiver's Contact Information: [phone] Caregiver Availability: 24/7 Discharge Plan Discussed with Primary Caregiver: Yes Is Caregiver In Agreement with Plan?: Yes Does Caregiver/Family have Issues with Lodging/Transportation while Pt is in Rehab?: No   Goals Patient/Family Goal for Rehab: Mod I-Supervision: PT/OT Expected length of stay: 7-10 days Pt/Family Agrees to Admission and willing to participate: Yes Program Orientation Provided & Reviewed with Pt/Caregiver Including Roles  & Responsibilities: Yes   Decrease burden of Care through IP rehab admission: NA   Possible need for SNF placement upon discharge:Not anticipated   Patient Condition: {PATIENT'S CONDITION:22832}  Preadmission Screen Completed By:  Amiel Kalata, CCC-SLP, 08/17/2023 2:49 PM ______________________________________________________________________   Discussed status with Dr. Aaron Aason***at *** and received approval for admission today.  Admission Coordinator:  Amiel Kalata, time***/Date***

## 2023-08-16 NOTE — Progress Notes (Signed)
    Subjective: Patient reports pain as moderate. Tolerating diet.  Urinating.   No CP, SOB.  Has mobilized very small amount OOB with PT.   Objective:   VITALS:   Vitals:   08/15/23 1421 08/15/23 2102 08/16/23 0500 08/16/23 0528  BP: 125/74 110/60  116/61  Pulse: 85 77  76  Resp: 17 16  18   Temp: 98.2 F (36.8 C) 98.3 F (36.8 C)  98 F (36.7 C)  TempSrc: Oral     SpO2: 98% 99%  99%  Weight:   81.1 kg   Height:          Latest Ref Rng & Units 08/16/2023    4:29 AM 08/15/2023    6:19 AM 08/14/2023    5:51 PM  CBC  WBC 4.0 - 10.5 K/uL 10.3  11.5  15.1   Hemoglobin 12.0 - 15.0 g/dL 9.9  16.1  09.6   Hematocrit 36.0 - 46.0 % 29.0  33.3  39.3   Platelets 150 - 400 K/uL 151  167  213       Latest Ref Rng & Units 08/16/2023    4:29 AM 08/15/2023    6:19 AM 08/14/2023    5:51 PM  BMP  Glucose 70 - 99 mg/dL 045  409    BUN 8 - 23 mg/dL 20  15    Creatinine 8.11 - 1.00 mg/dL 9.14  7.82  9.56   Sodium 135 - 145 mmol/L 132  130    Potassium 3.5 - 5.1 mmol/L 4.3  3.9    Chloride 98 - 111 mmol/L 103  100    CO2 22 - 32 mmol/L 23  21    Calcium  8.9 - 10.3 mg/dL 8.6  8.7     Intake/Output      04/19 0701 04/20 0700 04/20 0701 04/21 0700   P.O. 720    I.V. (mL/kg) 3 (0)    IV Piggyback     Total Intake(mL/kg) 723 (8.9)    Urine (mL/kg/hr) 1100 (0.6)    Stool     Total Output 1100    Net -377            Physical Exam: General: NAD.  Sitting up bedside chair, calm, comfortable  Resp: No increased wob Cardio: regular rate and rhythm ABD soft Neurologically intact MSK Neurovascularly intact Sensation intact distally Intact pulses distally Dorsiflexion/Plantar flexion intact Incision: dressings C/D/I   Assessment: 2 Days Post-Op  S/P Procedure(s) (LRB): FIXATION, FRACTURE, INTERTROCHANTERIC, WITH INTRAMEDULLARY ROD (Left) by Dr. Curtiss Dowdy on 08/14/23  Principal Problem:   Closed left hip fracture (HCC)   Plan:  Up with therapy Incentive Spirometry Elevate  and Apply ice  Weightbearing: WBAT LLE Insicional and dressing care: Reinforce dressings as needed Orthopedic device(s): None Showering: Keep dressing dry VTE prophylaxis:  Lovenox  while inpatient, can switch to ASA 81mg  bid x 30 days upon d/c  , SCDs, ambulation Pain control: PRN Follow - up plan: 2 weeks in office with Dr. Ephriam Hashimoto information for today:  Randal Bury MD, Marzella Solan PA-C  Dispo:  PT recommending CIR.     Lore Rode, PA-C Office [phone] 08/16/2023, 9:37 AM

## 2023-08-16 NOTE — Plan of Care (Signed)
   Problem: Health Behavior/Discharge Planning: Goal: Ability to manage health-related needs will improve Outcome: Progressing   Problem: Clinical Measurements: Goal: Ability to maintain clinical measurements within normal limits will improve Outcome: Progressing Goal: Will remain free from infection Outcome: Progressing

## 2023-08-16 NOTE — Plan of Care (Signed)

## 2023-08-16 NOTE — Progress Notes (Signed)
  Progress Note   Patient: Sara Barnes WUJ:[id number] DOB: Sep 02, 1940 DOA: 08/14/2023     2 DOS: the patient was seen and examined on 08/16/2023   Brief hospital course: 83 y.o. female with medical history significant of afib not on anticoagulation, hx breast cancer followed by Oncology, crohn's disease who presents to ED following mechanical fall resulting in L hip pain. Pt reported being startled by an arthropod overnight which resulted in pt flinching back and causing her to lose her balance. Pt then fell, resulting in L hip pain.    In the ED,  head and neck CT was found to be unremarkable. Xray confirmed acute L hip fracture. Orthopedic Surgery consulted. Hospitalist consulted for consideration for admission. On further questioning, pt denied florid syncope. Does continue to complain of uncontrolled hip pain despite 0.5mg  doses of dialudid this AM.   Assessment and Plan: L hip fracture  -s/p mechanical fall, fracture confirmed on xray hip -Orthopedic Surgery following. Pt is now s/p surgery 4/18 -Continue analgesia as needed -PT/OT following, thus far recs for inpt rehab. CIR following for potential placement   Hx Crohn's disease -stable -Follows gastroenterologist, Dr. Bridgett Camps as outpatient -cont lomotil  as needed -continued on daily prednisone   History of metastatic breast cancer -Patient noted to have mets to the chest wall which has been excised. -Known to have mets to bone, liver, hx malignant pleural effusion -Follows oncology as outpt  Hx Paroxysmal atrial fibrillation -Currently rate controlled -Not a candidate for anticoagulation due to hx of intra-abdominal hematoma. -most recent 2D echo from 2022 with normal EF.  TSH from 12/22 of 1.286      Subjective: Pt reports some more pain today when working with therapy  Physical Exam: Vitals:   08/16/23 0500 08/16/23 0528 08/16/23 1034 08/16/23 1556  BP:  116/61 106/66 (!) 112/56  Pulse:  76 72 74  Resp:  18 19 18    Temp:  98 F (36.7 C) 98 F (36.7 C) 97.9 F (36.6 C)  TempSrc:   Oral Oral  SpO2:  99% 99% 98%  Weight: 81.1 kg     Height:       General exam: Conversant, in no acute distress Respiratory system: normal chest rise, clear, no audible wheezing Cardiovascular system: regular rhythm, s1-s2 Gastrointestinal system: Nondistended, nontender, pos BS Central nervous system: No seizures, no tremors Extremities: No cyanosis, no joint deformities Skin: No rashes, no pallor Psychiatry: Affect normal // no auditory hallucinations   Data Reviewed:  Labs reviewed: Na 132, K 4.3, Cr 1.05, WBC 10.3, hgb 9.9, Plts 151  Family Communication: Pt in room, family at bedside  Disposition: Status is: Inpatient Remains inpatient appropriate because: Severity of illness  Planned Discharge Destination: Rehab    Author: Cherylle Corwin, MD 08/16/2023 6:42 PM  For on call review [email or web address].

## 2023-08-17 ENCOUNTER — Encounter (HOSPITAL_COMMUNITY): Payer: Self-pay | Admitting: Student

## 2023-08-17 DIAGNOSIS — C50919 Malignant neoplasm of unspecified site of unspecified female breast: Secondary | ICD-10-CM | POA: Diagnosis not present

## 2023-08-17 DIAGNOSIS — S72142A Displaced intertrochanteric fracture of left femur, initial encounter for closed fracture: Secondary | ICD-10-CM | POA: Diagnosis not present

## 2023-08-17 DIAGNOSIS — S72002A Fracture of unspecified part of neck of left femur, initial encounter for closed fracture: Secondary | ICD-10-CM | POA: Diagnosis not present

## 2023-08-17 DIAGNOSIS — K5090 Crohn's disease, unspecified, without complications: Secondary | ICD-10-CM | POA: Diagnosis not present

## 2023-08-17 DIAGNOSIS — C7951 Secondary malignant neoplasm of bone: Secondary | ICD-10-CM

## 2023-08-17 MED ORDER — POLYETHYLENE GLYCOL 3350 17 G PO PACK
17.0000 g | PACK | Freq: Every day | ORAL | Status: DC
  Administered 2023-08-18: 17 g via ORAL
  Filled 2023-08-17 (×2): qty 1

## 2023-08-17 MED ORDER — ALUM & MAG HYDROXIDE-SIMETH 200-200-20 MG/5ML PO SUSP
30.0000 mL | ORAL | Status: DC | PRN
  Administered 2023-08-17: 30 mL via ORAL
  Filled 2023-08-17: qty 30

## 2023-08-17 MED ORDER — SIMETHICONE 80 MG PO CHEW
80.0000 mg | CHEWABLE_TABLET | Freq: Four times a day (QID) | ORAL | Status: DC | PRN
  Filled 2023-08-17: qty 1

## 2023-08-17 NOTE — Progress Notes (Signed)
  Progress Note   Patient: Sara Barnes PIR:[id number] DOB: [birth year] DOA: 08/14/2023     3 DOS: the patient was seen and examined on 08/17/2023   Brief hospital course: 83 y.o. female with medical history significant of afib not on anticoagulation, hx breast cancer followed by Oncology, crohn's disease who presents to ED following mechanical fall resulting in L hip pain. Pt reported being startled by an arthropod overnight which resulted in pt flinching back and causing her to lose her balance. Pt then fell, resulting in L hip pain.    In the ED,  head and neck CT was found to be unremarkable. Xray confirmed acute L hip fracture. Orthopedic Surgery consulted. Hospitalist consulted for consideration for admission. On further questioning, pt denied florid syncope. Does continue to complain of uncontrolled hip pain despite 0.5mg  doses of dialudid this AM.   Assessment and Plan: L hip fracture  -s/p mechanical fall, fracture confirmed on xray hip -Orthopedic Surgery following. Pt is now s/p surgery 4/18 -Continue analgesia as needed -PT/OT following, thus far recs for inpt rehab. CIR was consulted and is following for potential placement   Hx Crohn's disease -stable -Follows gastroenterologist, Dr. Bridgett Camps as outpatient -cont lomotil  as needed -continued on daily prednisone   History of metastatic breast cancer -Patient noted to have mets to the chest wall which has been excised. -Known to have mets to bone, liver, hx malignant pleural effusion -Follows oncology as outpt  Hx Paroxysmal atrial fibrillation -Currently rate controlled -Not a candidate for anticoagulation due to hx of intra-abdominal hematoma. -most recent 2D echo from 2022 with normal EF.  TSH from 12/22 of 1.286      Subjective: Without complaints today  Physical Exam: Vitals:   08/16/23 1930 08/17/23 0535 08/17/23 0900 08/17/23 1518  BP: (!) 130/53 128/60 (!) 96/53 (!) 105/49  Pulse: 81 78 89 79  Resp: 16 15 17 20    Temp: 98.2 F (36.8 C) 98.2 F (36.8 C) 98 F (36.7 C) 97.9 F (36.6 C)  TempSrc:  Axillary Oral Oral  SpO2: 99% 100% 97% 99%  Weight:      Height:       General exam: Awake, laying in bed, in nad Respiratory system: Normal respiratory effort, no wheezing Cardiovascular system: regular rate, s1, s2 Gastrointestinal system: Soft, nondistended, positive BS Central nervous system: CN2-12 grossly intact, strength intact Extremities: Perfused, no clubbing Skin: Normal skin turgor, no notable skin lesions seen Psychiatry: Mood normal // no visual hallucinations   Data Reviewed:  There are no new results to review at this time.  Family Communication: Pt in room, family not at bedside  Disposition: Status is: Inpatient Remains inpatient appropriate because: Severity of illness  Planned Discharge Destination: Rehab    Author: Cherylle Corwin, MD 08/17/2023 5:25 PM  For on call review [email or web address].

## 2023-08-17 NOTE — Consult Note (Signed)
 Physical Medicine and Rehabilitation Consult Reason for Consult: Left hip fracture Referring Physician: Joan Mouton   HPI: Sara Barnes is a 83 y.o. female with history of stage Ia intraductal carcinoma, Crohn's disease, who fell on 08/14/2023 and sustained a left intertrochanteric hip fracture requiring IM nail by Dr. Curtiss Dowdy with postoperative weightbearing as tolerated.  She has undergone PT evaluations on 08/16/2023.   The patient was living alone prior to admission but had family and friends that could assist her. She was not on anticoagulation for her atrial fibrillation due to history intra-abdominal hematoma formation. Her Crohn's disease is managed with prednisone .  She still has chronic diarrhea.  She takes Imodium  at home The patient does have active metastases seen on PET scan in January 2025 in the mediastinal area, hilar nodes as well as left fifth rib left third rib and left T5 vertebral body.  No mets noted in the hip.  She is not on chronic pain medication at home she has been on IM fulvestrant  treatments on a monthly basis at the cancer center.  She follows with Dr. Maryalice Smaller PT eval 4/20 the patient was able to ambulate minimal assistance 6 feet, the patient states she ambulated from her room to the nursing desk this morning with therapy. OT eval 4/19, mod to max assist with lower body ADLs The patient has used IV pain medication prior to therapy thus far.  She does have tramadol  ordered as well but has only used this medication once a day at most.  She does not have any oral schedule II medications Review of Systems  Constitutional:  Negative for chills and fever.  HENT:  Negative for hearing loss.   Eyes:  Negative for discharge and redness.  Respiratory:  Positive for wheezing. Negative for cough, hemoptysis, shortness of breath and stridor.   Cardiovascular:  Negative for chest pain and palpitations.  Gastrointestinal:  Positive for diarrhea. Negative for abdominal pain,  constipation, nausea and vomiting.  Genitourinary:  Negative for dysuria and flank pain.  Musculoskeletal:  Positive for falls and joint pain. Negative for back pain.  Skin:  Negative for itching and rash.  Neurological:  Negative for sensory change, loss of consciousness and weakness.  Psychiatric/Behavioral:  Negative for hallucinations. The patient is not nervous/anxious.    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   Seasonal allergies  TMJ (temporomandibular joint syndrome)    Ulcerative colitis    Vitamin B12 deficiency    Past Surgical History:  Procedure Laterality Date   ABDOMINAL HYSTERECTOMY     partial   APPENDECTOMY     AUGMENTATION MAMMAPLASTY Bilateral    BASAL CELL CARCINOMA EXCISION Left    nose   BREAST BIOPSY Left 2019   benign   BREAST ENHANCEMENT SURGERY     BREAST LUMPECTOMY Left 2016   BREAST LUMPECTOMY WITH RADIOACTIVE SEED AND SENTINEL LYMPH NODE BIOPSY Left 12/13/2014   Procedure: LEFT BREAST LUMPECTOMY WITH RADIOACTIVE SEED AND  SENTINEL LYMPH NODE BIOPSY;  Surgeon: Lockie Rima, MD;  Location: [facility];  Service: General;  Laterality: Left;   COLONOSCOPY WITH PROPOFOL   05/28/2011; 06/28/2014   EXPLORATORY LAPAROTOMY  04/08/2018   with end ileostomy takedown and creation of loop ileostomy-- [facility]-Dr Aretta Beery   FLEXIBLE SIGMOIDOSCOPY N/A 01/08/2016   Procedure: Marlynn Singer;  Surgeon: Danette Duos, MD;  Location: [facility];  Service: Gastroenterology;  Laterality: N/A;  needs MAC due to pain and anxiety   ILEOSTOMY N/A 05/25/2017   Procedure: DILATION OF ILEOSTOMY;  Surgeon: Enid Harry, MD;  Location: [facility];  Service: General;  Laterality: N/A;   ILEOSTOMY  02/11/2016   END ileostomy.  Dr Gaylyn Keas   ILEOSTOMY CLOSURE N/A 06/02/2017   Procedure: RESECTION OF ILEAL STRICTURE; ILEOSTOMY REVISION ;  Surgeon: Candyce Champagne, MD;  Location: [facility];  Service: General;  Laterality: N/A;   ILEOSTOMY DILATION  05/25/2017   DILITATION OF ILEAL STRICTURE   ileostomy stricture resection  06/02/2017   with revision   INCISION AND DRAINAGE ABSCESS  2018   INCISION AND DRAINAGE PERIRECTAL ABSCESS N/A 09/28/2013   Procedure: IRRIGATION AND DEBRIDEMENT PERIANAL ABSCESS, proctoscopy;  Surgeon: Thayne Fine, MD;  Location: [facility];  Service: General;  Laterality: N/A;   INCISION AND DRAINAGE PERIRECTAL ABSCESS N/A 01/22/2016   Procedure: IRRIGATION AND DEBRIDEMENT PERIRECTAL ABSCESS;  Surgeon: Oralee Billow, MD;  Location: [facility];  Service: General;  Laterality: N/A;   INNER EAR SURGERY Bilateral    LAPAROSCOPIC DIVERTED COLOSTOMY N/A 02/11/2016   Procedure: LAPAROSCOPIC DIVERTED ILEOSTOMY;  Surgeon: Jacolyn Matar, MD;  Location: [facility];  Service: General;  Laterality: N/A;   LAPAROSCOPY N/A 03/23/2017   Procedure: LAPAROSCOPY DIAGNOSTIC, EXPLORATORY LAPAROTOMY,  BOWEL RESECTION;  Surgeon: Adalberto Hollow, MD;  Location: [facility];  Service: General;  Laterality: N/A;    Family History  Problem Relation Age of Onset   Prostate cancer Brother 76   Kidney disease Brother    Diabetes Brother    Brain cancer Father    Rheum arthritis Mother    Anesthesia problems Sister        post-op N/V   Emphysema Maternal Grandfather    Asthma Sister    Rheum arthritis Brother    Esophageal cancer Neg Hx    Breast cancer Neg Hx    Rectal cancer Neg Hx    Stomach cancer Neg Hx    Colon cancer Neg Hx    Social History:  reports that she has never smoked. She has never used smokeless tobacco. She reports current alcohol  use of about 7.0 standard drinks of alcohol  per week. She reports that she does not use drugs. Allergies:  Allergies  Allergen Reactions   Nsaids Other (See Comments)    Crohn's disease = NO NSAIDs   Codeine Nausea And Vomiting   Iodinated Contrast Media  Elevated BP,Itchy skin. Face red in color   Mesalamine  Nausea And Vomiting   Omnipaque  [Iohexol ] Itching    Itching and flushed/red face   Oxycodone  Itching and Rash   Lactose Intolerance (Gi) Diarrhea   Norco [Hydrocodone -Acetaminophen ] Nausea Only   Other Itching    PERFUMED LOTIONS  Cant have MRI due to ear implant    Sulfa Antibiotics Rash   Medications Prior to Admission  Medication Sig Dispense Refill   acetaminophen  (TYLENOL ) 500 MG tablet You can take 2 tablets every 6 hours as needed for pain.  Use this as your primary pain control.  Use Tramadol  as a last resort.  You can buy this over the counter at any drug store.    DO NOT TAKE MORE THAN 4000 MG OF TYLENOL  PER DAY.  IT CAN HARM YOUR LIVER. 30 tablet 0   ALPRAZolam  (XANAX ) 0.25 MG tablet Take 1 tablet (0.25 mg total) by mouth 2 (two) times daily as needed for anxiety. 60 tablet 0   cyanocobalamin  (VITAMIN B12) 1000 MCG/ML injection INJECT 1ML INTO THE MUSCLE ONCE PER MONTH 3 mL 11   diphenoxylate -atropine  (LOMOTIL ) 2.5-0.025 MG tablet TAKE 1 TO 2 TABLETS BY MOUTH THREE TO FOUR TIMES DAILY AS NEEDED FOR SEVERE DIARRHEA 240  tablet 3   famotidine  (PEPCID ) 20 MG tablet Take 1 tablet (20 mg total) by mouth 2 (two) times daily. (Patient taking differently: Take 20 mg by mouth daily as needed for heartburn.) 180 tablet 6   FLUoxetine  (PROZAC ) 20 MG capsule TAKE 1 CAPSULE(20 MG) BY MOUTH DAILY 90 capsule 1   fluticasone  (FLONASE ) 50 MCG/ACT nasal spray Place 2 sprays into both nostrils daily. (Patient taking differently: Place 2 sprays into both nostrils daily as needed for rhinitis.) 16 g 6   loperamide  (IMODIUM ) 2 MG capsule Take 1 capsule (2 mg total) by mouth as needed for diarrhea or loose stools. 90 capsule 1   predniSONE  (DELTASONE ) 5 MG tablet TAKE 1 TABLET BY MOUTH EVERY DAY 90 tablet 3   zolpidem  (AMBIEN ) 10 MG tablet TAKE 1 TABLET(10 MG) BY MOUTH AT BEDTIME AS NEEDED FOR SLEEP 30 tablet 1   lidocaine -prilocaine  (EMLA ) cream Apply 1 Application topically as needed (FOR INEJCTION PAIN). (Patient not taking: Reported on 08/14/2023) 30 g 0   ondansetron  (ZOFRAN -ODT) 4 MG disintegrating tablet DISSOLVE ONE TABLET BY MOUTH EVERY 6 HOURS AS NEEDED FOR NAUSEA AND VOMITING (Patient not taking: Reported on 08/14/2023) 30 tablet 3   prochlorperazine  (COMPAZINE ) 5 MG tablet Take 1-2 tablets (5-10 mg total) by mouth every 6 (six) hours as needed for nausea or vomiting. (Patient not taking: Reported on 08/14/2023) 30 tablet 2    Home: Home Living Family/patient expects to be discharged to:: Private residence Living Arrangements: Alone Available Help at Discharge: Family, Available 24 hours/day Type of Home: House (townhouse) Home Access: Stairs to enter Secretary/administrator of Steps: 4-5 Entrance Stairs-Rails: None Home Layout: Able to live on main level with bedroom/bathroom Alternate Level Stairs-Number of Steps: flight Alternate Level Stairs-Rails: Can reach both Bathroom Shower/Tub: Engineer, manufacturing systems: Standard Bathroom Accessibility: Yes Home Equipment: Jeananne Mighty - single point, Agricultural consultant (2 wheels),  Shower seat, Rollator (4 wheels)  Functional History: Prior Function Prior Level of Function : Independent/Modified Independent, Driving Mobility Comments: ambulatory without DME ADLs Comments: Ind with ADL, occasional assist from sister for iADL Functional Status:  Mobility: Bed Mobility Overal bed mobility: Needs Assistance Bed Mobility: Supine to Sit Supine to sit: Min assist, HOB elevated, Used rails  General bed mobility comments: sitting BSC upon entering Transfers Overall transfer level: Needs assistance Equipment used: Rolling walker (2 wheels) Transfers: Sit to/from Stand, Bed to chair/wheelchair/BSC Sit to Stand: Min assist Bed to/from chair/wheelchair/BSC transfer type:: Step pivot Step pivot transfers: Min assist General transfer comment: verbal cues for hand placement and increased trunk flexion Ambulation/Gait Ambulation/Gait assistance: Min assist Gait Distance (Feet): 6 Feet Assistive device: Rolling walker (2 wheels) Gait Pattern/deviations: Step-to pattern General Gait Details: slowed step-to gait, reduced stance time on LLE. verbal cues for gait sequencing Gait velocity: reduced Gait velocity interpretation: <1.31 ft/sec, indicative of household ambulator    ADL: ADL Overall ADL's : Needs assistance/impaired Eating/Feeding: Independent, Sitting Grooming: Set up, Sitting Upper Body Bathing: Supervision/ safety, Sitting Lower Body Bathing: Moderate assistance, Sit to/from stand Upper Body Dressing : Sitting, Minimal assistance Lower Body Dressing: Maximal assistance, Sit to/from stand Toilet Transfer: Minimal assistance, BSC/3in1, Stand-pivot  Cognition: Cognition Orientation Level: Oriented X4, Oriented to person, Oriented to place, Oriented to time, Oriented to situation Cognition Arousal: Alert Behavior During Therapy: [facility] for tasks assessed/performed  Blood pressure (!) 96/53, pulse 89, temperature 98 F (36.7 C), temperature source Oral, resp.  rate 17, height 5\' 6"  (1.676 m), weight 81.1 kg, SpO2 97%. Physical Exam Vitals and nursing note reviewed.  Constitutional:      Appearance: She is obese.  HENT:     Head: Normocephalic and atraumatic.     Nose: Nose normal.     Mouth/Throat:     Mouth: Mucous membranes are moist.  Eyes:     Extraocular Movements: Extraocular movements intact.     Conjunctiva/sclera: Conjunctivae normal.     Pupils: Pupils are equal, round, and reactive to light.  Cardiovascular:     Rate and Rhythm: Normal rate and regular rhythm.     Heart sounds: Normal heart sounds. No murmur heard. Pulmonary:     Breath sounds: Wheezing and rales present.     Comments: Mild rales and wheezes left lower lung posterior Abdominal:     General: Abdomen is flat. Bowel sounds are normal. There is no distension.     Tenderness: There is no abdominal tenderness.  Musculoskeletal:     Cervical back: No tenderness.     Left lower leg: Edema present.     Comments: Reduced active range of motion and pain in the left hip. No evidence of knee effusion bilaterally or with joint line tenderness. No pain with ankle range of motion Full shoulder range of motion Bruising at the right posterior deltoid  Skin:    General: Skin is warm and dry.  Neurological:     Mental Status: She is alert and oriented to person, place, and time.     Cranial Nerves: No dysarthria or facial asymmetry.     Sensory: No sensory deficit.     Motor: No tremor or abnormal muscle tone.     Coordination: Coordination is intact.     Gait: Gait abnormal.     Comments: Motor strength is 5/5 bilateral deltoid bicep tricep grip 3 - bilateral hip flexion 4 - right knee extension 3 - left knee extension pain related 5/5 bilateral ankle dorsiflexors and plantar flexors  Psychiatric:        Mood and Affect: Mood normal.        Behavior: Behavior normal.     No results found. However, due to the size of the patient record, not all encounters were  searched. Please check Results Review for a complete  set of results. No results found.  Assessment/Plan: Diagnosis: Left intertrochanteric hip fracture due to fall 08/14/2023 status post IM nail with weightbearing as tolerated Does the need for close, 24 hr/day medical supervision in concert with the patient's rehab needs make it unreasonable for this patient to be served in a less intensive setting? Yes Co-Morbidities requiring supervision/potential complications:  - Crohn's disease, breast cancer, acute postoperative pain requiring IV pain medications Due to bladder management, bowel management, safety, skin/wound care, disease management, medication administration, pain management, and patient education, does the patient require 24 hr/day rehab nursing? Yes Does the patient require coordinated care of a physician, rehab nurse, therapy disciplines of PT, OT to address physical and functional deficits in the context of the above medical diagnosis(es)? Yes Addressing deficits in the following areas: balance, endurance, locomotion, strength, transferring, bowel/bladder control, bathing, dressing, feeding, grooming, and toileting Can the patient actively participate in an intensive therapy program of at least 3 hrs of therapy per day at least 5 days per week? Yes The potential for patient to make measurable gains while on inpatient rehab is excellent Anticipated functional outcomes upon discharge from inpatient rehab are modified independent and supervision  with PT, modified independent and supervision with OT, n/a with SLP. Estimated rehab length of stay to reach the above functional goals is: 7 to 10 days Anticipated discharge destination:  Home with family assistance for complex ADLs Overall Rehab/Functional Prognosis: excellent  POST ACUTE RECOMMENDATIONS: This patient's condition is appropriate for continued rehabilitative care in the following setting: CIR Patient has agreed to participate in  recommended program. Yes Note that insurance prior authorization may be required for reimbursement for recommended care.  Comment: Sister can assist/stay in home for couple weeks postdischarge.   MEDICAL RECOMMENDATIONS: Please order oral schedule II medications as an alternative to IV pain medication.   I have personally performed a face to face diagnostic evaluation of this patient. Additionally, I have examined the patient's medical record including any pertinent labs and radiographic images.    Thanks,  Genetta Kenning, MD 08/17/2023

## 2023-08-17 NOTE — Care Management Important Message (Signed)
 Important Message  Patient Details  Name: Sara Barnes MRN: [id number] Date of Birth: Jul 28, 1940   Important Message Given:  Yes - Medicare IM     Saskia Simerson 08/17/2023, 4:27 PM

## 2023-08-17 NOTE — Progress Notes (Signed)
 Physical Therapy Treatment Patient Details Name: Sara Barnes MRN: [id number] DOB: Feb 12, 1941 Today's Date: 08/17/2023   History of Present Illness 83 y.o. female presents to [facility] hospital on 08/14/2023 after fall and subsequent L hip fx. Pt underwent IM nailing on 4/18. PMH includes afib, breast CA, crohn's disease, HLD.    PT Comments  Pt progressing steadily towards her physical therapy goals and is motivated to participate. Initiated session with warm up exercises in supine for LLE. Pt requiring minimal assist for functional mobility. Ambulating 75 ft with a walker, utilizing a step to pattern. Presents as a high fall risk based on decreased gait speed and history of falls. Suspect excellent progress based on PLOF, motivation and family support. Patient will benefit from intensive inpatient follow-up therapy, >3 hours/day.    If plan is discharge home, recommend the following: A little help with walking and/or transfers;A lot of help with bathing/dressing/bathroom;Assistance with cooking/housework;Assist for transportation;Help with stairs or ramp for entrance   Can travel by private vehicle        Equipment Recommendations  BSC/3in1;Wheelchair (measurements PT)    Recommendations for Other Services       Precautions / Restrictions Precautions Precautions: Fall Recall of Precautions/Restrictions: Intact Restrictions Weight Bearing Restrictions Per Provider Order: Yes LLE Weight Bearing Per Provider Order: Weight bearing as tolerated     Mobility  Bed Mobility Overal bed mobility: Needs Assistance Bed Mobility: Supine to Sit     Supine to sit: Min assist     General bed mobility comments: Assist to initiate movement of LLE, but pt able to maneuver to edge of bed and sit up trunk    Transfers Overall transfer level: Needs assistance Equipment used: Rolling walker (2 wheels) Transfers: Sit to/from Stand Sit to Stand: Min assist           General transfer comment:  MinA to power up to standing position, cues for hand placement    Ambulation/Gait Ambulation/Gait assistance: Min assist Gait Distance (Feet): 75 Feet Assistive device: Rolling walker (2 wheels) Gait Pattern/deviations: Step-to pattern, Decreased stance time - left, Decreased dorsiflexion - left, Decreased weight shift to left Gait velocity: decreased Gait velocity interpretation: <1.8 ft/sec, indicate of risk for recurrent falls   General Gait Details: Verbal cues for sequencing/technique, walker use. MinA for balance, particularly with turns or with fatigue with increased distance. Chair follow utilized   Building services engineer Rankin (Stroke Patients Only)       Balance Overall balance assessment: Needs assistance Sitting-balance support: No upper extremity supported, Feet supported Sitting balance-Leahy Scale: Good     Standing balance support: Bilateral upper extremity supported, Reliant on assistive device for balance Standing balance-Leahy Scale: Poor                              Communication Communication Communication: No apparent difficulties  Cognition Arousal: Alert Behavior During Therapy: WFL for tasks assessed/performed   PT - Cognitive impairments: No apparent impairments                         Following commands: Intact      Cueing Cueing Techniques: Verbal cues  Exercises General Exercises - Lower Extremity Ankle Circles/Pumps: PROM, Both, 20 reps, Supine Quad Sets: Both, 10 reps, Supine Hip ABduction/ADduction: AAROM, Left,  10 reps, Supine    General Comments        Pertinent Vitals/Pain Pain Assessment Pain Assessment: Faces Faces Pain Scale: Hurts even more Pain Location: LLE Pain Descriptors / Indicators: Operative site guarding, Grimacing Pain Intervention(s): Limited activity within patient's tolerance, Monitored during session, Premedicated before session,  Patient requesting pain meds-RN notified, Ice applied (Pt requesting IV pain meds prior to start of session)    Home Living                          Prior Function            PT Goals (current goals can now be found in the care plan section) Acute Rehab PT Goals Patient Stated Goal: to regain independence Potential to Achieve Goals: Good Progress towards PT goals: Progressing toward goals    Frequency    Min 3X/week      PT Plan      Co-evaluation              AM-PAC PT "6 Clicks" Mobility   Outcome Measure  Help needed turning from your back to your side while in a flat bed without using bedrails?: A Little Help needed moving from lying on your back to sitting on the side of a flat bed without using bedrails?: A Little Help needed moving to and from a bed to a chair (including a wheelchair)?: A Little Help needed standing up from a chair using your arms (e.g., wheelchair or bedside chair)?: A Little Help needed to walk in hospital room?: A Little Help needed climbing 3-5 steps with a railing? : A Lot 6 Click Score: 17    End of Session Equipment Utilized During Treatment: Gait belt Activity Tolerance: Patient tolerated treatment well Patient left: in chair;with call bell/phone within reach;with chair alarm set Nurse Communication: Mobility status PT Visit Diagnosis: Other abnormalities of gait and mobility (R26.89);Muscle weakness (generalized) (M62.81);History of falling (Z91.81)     Time: 1610-9604 PT Time Calculation (min) (ACUTE ONLY): 45 min  Charges:    $Gait Training: 23-37 mins $Therapeutic Activity: 8-22 mins PT General Charges $$ ACUTE PT VISIT: 1 Visit                     Sara Barnes, PT, DPT Acute Rehabilitation Services Office [phone]    Sara Barnes 08/17/2023, 12:21 PM

## 2023-08-17 NOTE — Progress Notes (Signed)
Inpatient Rehab Admissions Coordinator:  ?Insurance authorization started. Will continue to follow. ? ? ?Adela Esteban Graves Madden, MS, CCC-SLP ?Admissions Coordinator ?[phone] ? ?

## 2023-08-17 NOTE — TOC CAGE-AID Note (Signed)
 Transition of Care [facility]) - CAGE-AID Screening  Patient Details  Name: Sara Barnes MRN: [id number] Date of Birth: November 26, 1940  Clinical Narrative:  Patient endorses occasional alcohol use, approximately 3 times a week when she has dinner with her sister. She only drinks white wine, 1-3 glasses. Patient denies any illicit drug use. Patient does not need substance abuse resources at this time.  CAGE-AID Screening:   Have You Ever Felt You Ought to Cut Down on Your Drinking or Drug Use?: No Have People Annoyed You By Critizing Your Drinking Or Drug Use?: No Have You Felt Bad Or Guilty About Your Drinking Or Drug Use?: No Have You Ever Had a Drink or Used Drugs First Thing In The Morning to Steady Your Nerves or to Get Rid of a Hangover?: No CAGE-AID Score: 0  Substance Abuse Education Offered: Yes

## 2023-08-17 NOTE — Plan of Care (Signed)

## 2023-08-17 NOTE — Progress Notes (Signed)
 Orthopaedic Trauma Progress Note  SUBJECTIVE: Doing ok today. Pain controlled with current medications. Making good progress with therapies. No chest pain. No SOB. No nausea/vomiting. No other complaints.  Tolerating diet and fluids.  No BM since admission. Will schedule MiraLAX  doses starting today.  Patient and her family are interested in CIR.  OBJECTIVE:  Vitals:   08/16/23 1930 08/17/23 0535  BP: (!) 130/53 128/60  Pulse: 81 78  Resp: 16 15  Temp: 98.2 F (36.8 C) 98.2 F (36.8 C)  SpO2: 99% 100%    Opiates Today (MME): Today's  total administered Morphine  Milligram Equivalents: 10 Opiates Yesterday (MME): Yesterday's total administered Morphine  Milligram Equivalents: 60  General: Sitting up in bed.  No acute distress Respiratory: No increased work of breathing.  Operative Extremity (LLE): Dressing removed, incisions CDI. Soreness over the hip as expected. Ankle DF/PF intact. Endorses sensation throughout extremity. +DP pulse  IMAGING: Stable post op imaging.   LABS: No results found. However, due to the size of the patient record, not all encounters were searched. Please check Results Review for a complete set of results.  ASSESSMENT: Sara Barnes is a 83 y.o. female, 3 Days Post-Op s/p fall Procedures: INTRAMEDULLARY NAIL LEFT INTERTROCHANTERIC/SUBTROCHANTERIC FEMUR FRACTURE  CV/Blood loss: Acute blood loss anemia, Hgb 9.9 on 08/16/23. Hemodynamically stable  PLAN: Weightbearing: WBAT LLE ROM: Unrestricted ROM Incisional and dressing care: Okay to leave incisions open to air Showering: Okay to begin getting incisions wet Orthopedic device(s): None  Pain management:  1. Tylenol  650 mg q 6 hours scheduled 2. Robaxin  500 mg q 6 hours PRN 3. Tramadol  50-100 mg q 6 hours PRN 4. Dilaudid  0.5-1 mg q 4 hours PRN VTE prophylaxis: Lovenox , SCDs ID:  Ancef  2gm post op completed Foley/Lines:  No foley, KVO IVFs Impediments to Fracture Healing: Vitamin D  level 22, started on  supplementation. Dispo: PT/OT evaluation ongoing, CIR evaluating patient for possible admission.  Okay for discharge from ortho standpoint.    D/C recommendations: - Tramadol , Tylenol , Robaxin  for pain control - Aspirin  325 mg daily x 30 days for DVT prophylaxis - Continue 1000 units Vit D supplementation daily x 90 days  Follow - up plan: 2 weeks after d/c for wound check and repeat x-rays   Contact information:  Katheryne Pane MD, Alona Jamaica PA-C. After hours and holidays please check [email or web address] for group call information for [facility]   Edilia Gordon, PA-C [phone] (office) [email or web address]

## 2023-08-18 ENCOUNTER — Encounter: Payer: Self-pay | Admitting: Hematology

## 2023-08-18 DIAGNOSIS — S72142A Displaced intertrochanteric fracture of left femur, initial encounter for closed fracture: Secondary | ICD-10-CM | POA: Diagnosis not present

## 2023-08-18 DIAGNOSIS — R739 Hyperglycemia, unspecified: Secondary | ICD-10-CM | POA: Diagnosis not present

## 2023-08-18 MED ORDER — HYDROMORPHONE HCL 2 MG PO TABS
1.0000 mg | ORAL_TABLET | ORAL | Status: DC | PRN
  Administered 2023-08-18 – 2023-08-19 (×5): 1 mg via ORAL
  Filled 2023-08-18 (×5): qty 1

## 2023-08-18 MED ORDER — HYDROMORPHONE HCL 1 MG/ML IJ SOLN
0.5000 mg | INTRAMUSCULAR | Status: DC | PRN
  Administered 2023-08-19 – 2023-08-20 (×5): 0.5 mg via INTRAVENOUS
  Filled 2023-08-18 (×6): qty 0.5

## 2023-08-18 NOTE — Progress Notes (Signed)
 OT Cancellation Note  Patient Details Name: Sara Barnes MRN: [id number] DOB: 12/17/1940   Cancelled Treatment:    Reason Eval/Treat Not Completed: Patient declined, no reason specified. Pt reporting x4 bouts of diarrhea and is fatigued. Declining OOB and ADLs for the day. Pt very motivated and requesting OT return tomorrow morning.   Wyn Nettle C, OT  Acute Rehabilitation Services Office [phone] Secure chat preferred   Sara Barnes 08/18/2023, 4:25 PM

## 2023-08-18 NOTE — Progress Notes (Signed)
 Inpatient Rehabilitation Admissions Coordinator   I have received a denial from [facility] after peer to peer completed with Dr Rachel Budds. I met with patient at bedside and she is aware and wished to appeal that determination. I will begin appeal.  Jeannetta Millman, RN, MSN Rehab Admissions Coordinator [phone] 08/18/2023 5:22 PM

## 2023-08-18 NOTE — Plan of Care (Signed)

## 2023-08-18 NOTE — Progress Notes (Signed)
 Inpatient Rehabilitation Admissions Coordinator   I met with patient and her sister, Sara Barnes, at bedside. I reviewed estimated cost of care if [employer] approves CIR admit. They are in agreement. I await [employer] determination.  Jeannetta Millman, RN, MSN Rehab Admissions Coordinator [phone] 08/18/2023 10:33 AM

## 2023-08-18 NOTE — Progress Notes (Signed)
 Physical Therapy Treatment  Patient Details Name: Sara Barnes MRN: [id number] DOB: 1941/03/08 Today's Date: 08/18/2023   History of Present Illness 83 y.o. female presents to [facility] hospital on 08/14/2023 after fall and subsequent L hip fx. Pt underwent IM nailing on 4/18. PMH includes afib, breast CA, crohn's disease, HLD.    PT Comments  Pt reports x4 bouts of diarrhea this date and states she is "wiped out". Pt afraid of getting OOB and having another bout of diarrhea. She received medication for this at start of session. Pt motivated to participate and was agreeable to bed level exercise. Pt with good technique and able to tolerate without complaints of increased pain. Encouraged pt to try and sit up in chair for dinner this evening. Will continue to follow and progress as able per POC.    If plan is discharge home, recommend the following: A little help with walking and/or transfers;A lot of help with bathing/dressing/bathroom;Assistance with cooking/housework;Assist for transportation;Help with stairs or ramp for entrance   Can travel by private vehicle        Equipment Recommendations  BSC/3in1;Wheelchair (measurements PT)    Recommendations for Other Services Rehab consult     Precautions / Restrictions Precautions Precautions: Fall Recall of Precautions/Restrictions: Intact Restrictions Weight Bearing Restrictions Per Provider Order: Yes LLE Weight Bearing Per Provider Order: Weight bearing as tolerated     Mobility  Bed Mobility                    Transfers                        Ambulation/Gait                   Stairs             Wheelchair Mobility     Tilt Bed    Modified Rankin (Stroke Patients Only)       Balance                                            Communication Communication Communication: No apparent difficulties  Cognition Arousal: Alert Behavior During Therapy: WFL for tasks  assessed/performed   PT - Cognitive impairments: No apparent impairments                         Following commands: Intact      Cueing Cueing Techniques: Verbal cues  Exercises General Exercises - Lower Extremity Ankle Circles/Pumps: Both, 20 reps, Supine, AROM Quad Sets: Both, 10 reps, Supine, AROM Gluteal Sets: Both, 10 reps, Supine, AROM Short Arc Quad: Left, 10 reps, Supine, AROM Heel Slides: Left, 10 reps, Supine, AAROM Hip ABduction/ADduction: AAROM, Left, 10 reps, Supine    General Comments        Pertinent Vitals/Pain Pain Assessment Pain Assessment: Faces Faces Pain Scale: Hurts little more Breathing: normal Pain Location: LLE Pain Descriptors / Indicators: Operative site guarding, Grimacing, Sore Pain Intervention(s): Limited activity within patient's tolerance, Monitored during session, Repositioned    Home Living                          Prior Function            PT Goals (current goals can now be found in the  care plan section) Acute Rehab PT Goals Patient Stated Goal: to regain independence PT Goal Formulation: With patient Time For Goal Achievement: 08/29/23 Potential to Achieve Goals: Good Progress towards PT goals: Progressing toward goals    Frequency    Min 3X/week      PT Plan      Co-evaluation              AM-PAC PT "6 Clicks" Mobility   Outcome Measure  Help needed turning from your back to your side while in a flat bed without using bedrails?: A Little Help needed moving from lying on your back to sitting on the side of a flat bed without using bedrails?: A Little Help needed moving to and from a bed to a chair (including a wheelchair)?: A Little Help needed standing up from a chair using your arms (e.g., wheelchair or bedside chair)?: A Little Help needed to walk in hospital room?: A Little Help needed climbing 3-5 steps with a railing? : A Lot 6 Click Score: 17    End of Session Equipment  Utilized During Treatment: Gait belt Activity Tolerance: Patient tolerated treatment well Patient left: in chair;with call bell/phone within reach;with chair alarm set Nurse Communication: Mobility status PT Visit Diagnosis: Other abnormalities of gait and mobility (R26.89);Muscle weakness (generalized) (M62.81);History of falling (Z91.81)     Time: 8469-6295 PT Time Calculation (min) (ACUTE ONLY): 19 min  Charges:    $Therapeutic Exercise: 8-22 mins PT General Charges $$ ACUTE PT VISIT: 1 Visit                     Simone Dubois, PT, DPT Acute Rehabilitation Services Secure Chat Preferred Office: [phone]    Venus Ginsberg 08/18/2023, 3:41 PM

## 2023-08-18 NOTE — Progress Notes (Signed)
  Progress Note   Patient: Sara Barnes UJW:[id number] DOB: 1940/09/15 DOA: 08/14/2023     4 DOS: the patient was seen and examined on 08/18/2023   Brief hospital course: 83 y.o. female with medical history significant of afib not on anticoagulation, hx breast cancer followed by Oncology, crohn's disease who presents to ED following mechanical fall resulting in L hip pain. Pt reported being startled by an arthropod overnight which resulted in pt flinching back and causing her to lose her balance. Pt then fell, resulting in L hip pain.    In the ED,  head and neck CT was found to be unremarkable. Xray confirmed acute L hip fracture. Orthopedic Surgery consulted. Hospitalist consulted for consideration for admission. On further questioning, pt denied florid syncope. Does continue to complain of uncontrolled hip pain despite 0.5mg  doses of dialudid this AM.   Assessment and Plan: L hip fracture  -s/p mechanical fall, fracture confirmed on xray hip -Orthopedic Surgery following. Pt is now s/p surgery 4/18 -Continue analgesia as needed -PT/OT following, thus far recs for inpt rehab. Insurance has denied CIR. Appeal has been initiated per patient request   Hx Crohn's disease -stable -Follows gastroenterologist, Dr. Bridgett Camps as outpatient -cont lomotil  as needed -continued on daily prednisone   History of metastatic breast cancer -Patient noted to have mets to the chest wall which has been excised. -Known to have mets to bone, liver, hx malignant pleural effusion -Follows oncology as outpt  Hx Paroxysmal atrial fibrillation -Currently rate controlled -Not a candidate for anticoagulation due to hx of intra-abdominal hematoma. -most recent 2D echo from 2022 with normal EF.  TSH from 12/22 of 1.286      Subjective: Complains of pain with PT  Physical Exam: Vitals:   08/18/23 0500 08/18/23 0515 08/18/23 0814 08/18/23 1344  BP:  125/70 118/62 (!) 110/46  Pulse:  65 73 79  Resp:  18 18 17    Temp:  97.7 F (36.5 C) 98.4 F (36.9 C) 98.6 F (37 C)  TempSrc:  Oral Oral   SpO2:  97% 94% 99%  Weight: 80.8 kg     Height:       General exam: Conversant, in no acute distress Respiratory system: normal chest rise, clear, no audible wheezing Cardiovascular system: regular rhythm, s1-s2 Gastrointestinal system: Nondistended, nontender, pos BS Central nervous system: No seizures, no tremors Extremities: No cyanosis, no joint deformities Skin: No rashes, no pallor Psychiatry: Affect normal // no auditory hallucinations   Data Reviewed:  There are no new results to review at this time.  Family Communication: Pt in room, family not at bedside  Disposition: Status is: Inpatient Remains inpatient appropriate because: Severity of illness  Planned Discharge Destination: Rehab    Author: Cherylle Corwin, MD 08/18/2023 6:11 PM  For on call review [email or web address].

## 2023-08-19 DIAGNOSIS — S72002A Fracture of unspecified part of neck of left femur, initial encounter for closed fracture: Secondary | ICD-10-CM | POA: Diagnosis not present

## 2023-08-19 LAB — CBC
HCT: 30.3 % — ABNORMAL LOW (ref 36.0–46.0)
Hemoglobin: 10.1 g/dL — ABNORMAL LOW (ref 12.0–15.0)
MCH: 31.6 pg (ref 26.0–34.0)
MCHC: 33.3 g/dL (ref 30.0–36.0)
MCV: 94.7 fL (ref 80.0–100.0)
Platelets: 211 10*3/uL (ref 150–400)
RBC: 3.2 MIL/uL — ABNORMAL LOW (ref 3.87–5.11)
RDW: 12.1 % (ref 11.5–15.5)
WBC: 9.1 10*3/uL (ref 4.0–10.5)
nRBC: 0 % (ref 0.0–0.2)

## 2023-08-19 LAB — COMPREHENSIVE METABOLIC PANEL WITH GFR
ALT: 18 U/L (ref 0–44)
AST: 30 U/L (ref 15–41)
Albumin: 2.4 g/dL — ABNORMAL LOW (ref 3.5–5.0)
Alkaline Phosphatase: 52 U/L (ref 38–126)
Anion gap: 10 (ref 5–15)
BUN: 14 mg/dL (ref 8–23)
CO2: 26 mmol/L (ref 22–32)
Calcium: 8.5 mg/dL — ABNORMAL LOW (ref 8.9–10.3)
Chloride: 96 mmol/L — ABNORMAL LOW (ref 98–111)
Creatinine, Ser: 0.76 mg/dL (ref 0.44–1.00)
GFR, Estimated: >60 mL/min (ref >60)
Glucose, Bld: 97 mg/dL (ref 70–99)
Potassium: 4.3 mmol/L (ref 3.5–5.1)
Sodium: 132 mmol/L — ABNORMAL LOW (ref 135–145)
Total Bilirubin: 1 mg/dL (ref 0.0–1.2)
Total Protein: 5.4 g/dL — ABNORMAL LOW (ref 6.5–8.1)

## 2023-08-19 MED ORDER — OXYCODONE HCL 5 MG PO TABS
10.0000 mg | ORAL_TABLET | ORAL | Status: DC | PRN
  Administered 2023-08-19 – 2023-08-20 (×3): 10 mg via ORAL
  Filled 2023-08-19 (×3): qty 2

## 2023-08-19 MED ORDER — ENOXAPARIN SODIUM 40 MG/0.4ML IJ SOSY
40.0000 mg | PREFILLED_SYRINGE | INTRAMUSCULAR | Status: DC
  Administered 2023-08-19 – 2023-08-24 (×6): 40 mg via SUBCUTANEOUS
  Filled 2023-08-19 (×6): qty 0.4

## 2023-08-19 MED ORDER — DIPHENOXYLATE-ATROPINE 2.5-0.025 MG PO TABS: 2.0000 | ORAL_TABLET | Freq: Four times a day (QID) | ORAL | Status: DC | PRN

## 2023-08-19 NOTE — Progress Notes (Signed)
 OT Cancellation Note  Patient Details Name: Sara Barnes MRN: [id number] DOB: [birth year]   Cancelled Treatment:    Reason Eval/Treat Not Completed: Pain limiting ability to participate. Per RN and pt, pain meds not received during nighttime, and pt is in severe pain. Pt agreeable for OT to return in afternoon, spoke with RN for pt to be premedicated for session.   Nastacia Raybuck C, OT  Acute Rehabilitation Services Office [phone] Secure chat preferred   Sara Barnes 08/19/2023, 10:41 AM

## 2023-08-19 NOTE — Progress Notes (Signed)
 Physical Therapy Treatment Patient Details Name: Sara Barnes MRN: [id number] DOB: 1940-10-21 Today's Date: 08/19/2023   History of Present Illness 83 y.o. female presents to [facility] hospital on 08/14/2023 after fall and subsequent L hip fx. Pt underwent IM nailing on 4/18. PMH includes afib, breast CA, crohn's disease, HLD.    PT Comments  Pt received standing in bathroom with OT after recently completing ADL activities. Pt is able to ambulate for short household distances with support of RW but does fatigue, becoming diaphoretic during ambulation. PT provides education on lower extremity HEP for general strengthening and to reduce the risk of developing edema in LLE. Pt continues to progress and will benefit from frequent mobilization. Pt is very independent and active at baseline. Patient will benefit from intensive inpatient follow-up therapy, >3 hours/day.    If plan is discharge home, recommend the following: A little help with walking and/or transfers;A lot of help with bathing/dressing/bathroom;Assistance with cooking/housework;Assist for transportation;Help with stairs or ramp for entrance   Can travel by private vehicle        Equipment Recommendations  BSC/3in1;Wheelchair (measurements PT)    Recommendations for Other Services       Precautions / Restrictions Precautions Precautions: Fall Recall of Precautions/Restrictions: Intact Restrictions Weight Bearing Restrictions Per Provider Order: Yes LLE Weight Bearing Per Provider Order: Weight bearing as tolerated     Mobility  Bed Mobility                    Transfers Overall transfer level: Needs assistance (pt received in standing) Equipment used: Rolling walker (2 wheels) Transfers: Sit to/from Stand Sit to Stand: Contact guard assist           General transfer comment: stand to sit with CGA    Ambulation/Gait Ambulation/Gait assistance: Contact guard assist Gait Distance (Feet): 55 Feet Assistive  device: Rolling walker (2 wheels) Gait Pattern/deviations: Step-through pattern Gait velocity: reduced Gait velocity interpretation: <1.31 ft/sec, indicative of household ambulator   General Gait Details: slowed step-through gait, reduced stance time on LLE especially with fatigue   Stairs             Wheelchair Mobility     Tilt Bed    Modified Rankin (Stroke Patients Only)       Balance Overall balance assessment: Needs assistance Sitting-balance support: No upper extremity supported, Feet supported Sitting balance-Leahy Scale: Good     Standing balance support: Reliant on assistive device for balance, Single extremity supported Standing balance-Leahy Scale: Poor                              Communication Communication Communication: Impaired Factors Affecting Communication: Hearing impaired  Cognition Arousal: Alert Behavior During Therapy: WFL for tasks assessed/performed   PT - Cognitive impairments: No apparent impairments                         Following commands: Intact      Cueing Cueing Techniques: Verbal cues  Exercises General Exercises - Lower Extremity Ankle Circles/Pumps: AROM, Left, 10 reps Long Arc Quad: AROM, Left, 5 reps    General Comments General comments (skin integrity, edema, etc.): VSS on RA, pt does become diaphoretic during session and reports concern about the possibility of passing out near end of ambulation. Pt BP after completion of ambulation and when seated in recliner is 131/73.      Pertinent Vitals/Pain Pain  Assessment Pain Assessment: Faces Faces Pain Scale: Hurts even more Pain Location: L hip Pain Descriptors / Indicators: Sore Pain Intervention(s): Monitored during session    Home Living                          Prior Function            PT Goals (current goals can now be found in the care plan section) Acute Rehab PT Goals Patient Stated Goal: to regain  independence Progress towards PT goals: Progressing toward goals    Frequency    Min 3X/week      PT Plan      Co-evaluation              AM-PAC PT "6 Clicks" Mobility   Outcome Measure  Help needed turning from your back to your side while in a flat bed without using bedrails?: A Little Help needed moving from lying on your back to sitting on the side of a flat bed without using bedrails?: A Little Help needed moving to and from a bed to a chair (including a wheelchair)?: A Little Help needed standing up from a chair using your arms (e.g., wheelchair or bedside chair)?: A Little Help needed to walk in hospital room?: A Little Help needed climbing 3-5 steps with a railing? : Total 6 Click Score: 16    End of Session Equipment Utilized During Treatment: Gait belt Activity Tolerance: Patient tolerated treatment well Patient left: in chair;with call bell/phone within reach;with chair alarm set;with family/visitor present Nurse Communication: Mobility status PT Visit Diagnosis: Other abnormalities of gait and mobility (R26.89);Muscle weakness (generalized) (M62.81);History of falling (Z91.81)     Time: 1359-1430 PT Time Calculation (min) (ACUTE ONLY): 31 min  Charges:    $Gait Training: 8-22 mins $Therapeutic Exercise: 8-22 mins PT General Charges $$ ACUTE PT VISIT: 1 Visit                     Rexie Catena, PT, DPT Acute Rehabilitation Office [phone]    Rexie Catena 08/19/2023, 2:47 PM

## 2023-08-19 NOTE — TOC Initial Note (Signed)
 Transition of Care [facility]) - Initial/Assessment Note    Patient Details  Name: Sara Barnes MRN: [id number] Date of Birth: 10-06-1940  Transition of Care [facility]) CM/SW Contact:    Sara Hals, LCSW Phone Number: 08/19/2023, 3:18 PM  Clinical Narrative:   CSW informed CIR insurance Sara Barnes has been denied, will be appealed.  CSW met with pt and sister Sara Barnes regarding SNF as potential backup plan.  They are agreeable, medicare choice document provided, permission given to send out referral in hub.  Permission given to speak with sister Sara Barnes and Sara Sara Barnes.    Referral sent out in hub for SNF.                 Expected Discharge Plan: IP Rehab Facility Barriers to Discharge: Continued Medical Work up, SNF Pending bed offer, Other (must enter comment) (CIR auth denial under appeal)   Patient Goals and CMS Choice Patient states their goals for this hospitalization and ongoing recovery are:: function normally CMS Medicare.gov Compare Post Acute Care list provided to:: Patient Choice offered to / list presented to : Patient      Expected Discharge Plan and Services In-house Referral: Clinical Social Work   Post Acute Care Choice: IP Rehab Living arrangements for the past 2 months: Single Family Home                                      Prior Living Arrangements/Services Living arrangements for the past 2 months: Single Family Home Lives with:: Self Patient language and need for interpreter reviewed:: Yes Do you feel safe going back to the place where you live?: Yes      Need for Family Participation in Patient Care: Yes (Comment) Care giver support system in place?: Yes (comment) Current home services: Other (comment) (none) Criminal Activity/Legal Involvement Pertinent to Current Situation/Hospitalization: No - Comment as needed  Activities of Daily Living      Permission Sought/Granted Permission sought to share information with : Family Supports Permission  granted to share information with : Yes, Verbal Permission Granted  Share Information with Sara Barnes, sister Sara Barnes  Permission granted to share info w AGENCY: SNF        Emotional Assessment Appearance:: Appears stated age Attitude/Demeanor/Rapport: Engaged Affect (typically observed): Appropriate, Pleasant Orientation: : Oriented to Self, Oriented to Place, Oriented to  Time, Oriented to Situation      Admission diagnosis:  Closed left hip fracture (HCC) [S72.002A] Elevated random blood glucose level [R73.9] Closed intertrochanteric fracture of left hip, initial encounter [facility]) [I69.629B] Patient Active Problem List   Diagnosis Date Noted   Closed left hip fracture (HCC) 08/14/2023   Anxiety about health 06/12/2022   Liver mass    Primary malignant neoplasm of breast with metastasis (HCC)    Pleural effusion    Status post thoracentesis    Malnutrition of moderate degree 05/10/2021   Clostridium difficile colitis    Intractable vomiting with nausea 04/19/2021   Left facial pain 11/15/2020   Chronic pansinusitis 11/12/2020   Primary insomnia 10/17/2020   Osteopenia of neck of left femur 03/26/2020   Preventative health care 03/26/2020   Closed fracture of shaft of right fibula with routine healing 02/02/2020   Hyponatremia 01/23/2020   Urinary frequency 01/23/2020   Dysphagia 01/23/2020   Acute pansinusitis 01/08/2018   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 06/02/2017 05/28/2017   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 05/21/2017   Right Barnes pain 05/19/2017   Dizziness 05/19/2017   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 03/24/2017   Chronic diastolic CHF (congestive heart failure) (HCC) 08/05/2016   Chest discomfort 06/21/2016   Nausea and vomiting    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   Small bowel obstruction (HCC)    Ileus (HCC)    Generalized anxiety disorder 02/14/2016   Ileostomy in  place  02/11/2016   Hypokalemia    Fever    Crohn's disease of colon with complication (HCC)    Other depression due to general medical condition    Difficulty in walking, not elsewhere classified    Acute blood loss anemia    Absolute anemia    Left upper quadrant pain    Abdominal pain 01/07/2016   GI bleed 01/07/2016   IBS (irritable bowel syndrome)    Tenesmus    Ulcerative pancolitis with rectal bleeding (HCC)    Diarrhea 12/24/2015   Crohn disease (HCC) 12/24/2015   Dehydration    Ulcerative pancolitis with abscess (HCC)    Rectal pain    Leucocytosis    Cancer of lower-inner quadrant of left female breast (HCC) 11/30/2014   Perirectal abscess 09/29/2013   Vitamin B12 deficiency 08/12/2006   Anxiety and depression 08/12/2006   PCP:  Estill Hemming, DO Pharmacy:   [id number] [place], [place] - [address] AT [facility] OF [address] & [facility] [address] [place] [address] Phone: [phone] Fax: [phone]     Social Drivers of Health (SDOH) Social History: SDOH Screenings   Food Insecurity: No Food Insecurity (08/14/2023)  Housing: Low Risk  (08/14/2023)  Transportation Needs: No Transportation Needs (08/14/2023)  Utilities: Not At Risk (08/14/2023)  Alcohol Screen: Low Risk  (03/26/2021)  Depression (PHQ2-9): Low Risk  (03/28/2022)  Financial Resource Strain: Low Risk  (03/26/2021)  Physical Activity: Sufficiently Active (03/26/2021)  Social Connections: Moderately Isolated (08/14/2023)  Stress: No Stress Concern Present (03/26/2021)  Tobacco Use: Low Risk  (08/14/2023)   SDOH Interventions:     Readmission Risk Interventions     No data to display

## 2023-08-19 NOTE — Plan of Care (Signed)
  Problem: Pain Managment: Goal: General experience of comfort will improve and/or be controlled Outcome: Progressing   Problem: Safety: Goal: Ability to remain free from injury will improve Outcome: Progressing

## 2023-08-19 NOTE — Progress Notes (Signed)
 Inpatient Rehabilitation Admissions Coordinator   I met at bedside with patient and her sister. I await [employer] Medicare appeal determination for possible CIR admit.  Jeannetta Millman, RN, MSN Rehab Admissions Coordinator [phone] 08/19/2023 10:42 AM

## 2023-08-19 NOTE — Progress Notes (Signed)
 [facility] ROUNDING NOTE ABY GESSEL ZOX:[id number]  DOB: 07-05-1940  DOA: 08/14/2023  PCP: Estill Hemming, DO  08/19/2023,7:38 AM  LOS: 5 days    Code Status: Full code   from: Home current Dispo:?  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  C. difficile colitis 2023 Metastatic breast cancer with liver biopsy 03/2021 and subsequent hematoma which self resolved-bony mets to chest with complications of left-sided pleural effusions previously Paroxysmal A-fib CHA2DS2-VASc >4-not a candidate previously for anticoagulation 2/2 hematoma  4/18 ground-level mechanical fall without LOC in the setting of being startled by an insect-left intertrochanteric fracture noted with subtrochanteric femur fracture additionally orthopedics Dr. Hiram Lukes consulted 4/18 cephalomedullary nailing left intertrochanteric femoral fracture--- WBAT   Plan  Left hip fracture-repair as above 4/18 Tylenol  650 every 6 scheduled Robaxin  500 every 6 as needed-first choice oral Dilaudid  1 mg every 4 for severe pain Dilaudid  0.5-1 every 4 as needed IV if not controlled by oral--- no changes today May use Robaxin  500 every 6 as needed spasm Lovenox  while inpatient ASA 81 twice daily X 30 days at discharge ending 5/17 WBAT LLE Await insurances  Paroxysmal atrial fibrillation CHA2DS2-VASc 4, has bled score 3 -not a candidate for anticoagulation secondary to previous intra-abdominal hematoma from liver biopsy Anticoagulation to be revisited as outpatient  Diarrhea subacute/chronic in setting of underlying Crohn's Stop Reglan  5-10 every 8 as needed nausea and MiraLAX  17 g daily, Colace 100 twice daily At home  on Lomotil  1-2 3-4 times daily for diarrhea--- this is chronic and we will continue treatment  Underlying Crohn's  disease with prior colonoscopy prior surgeries and SBO--sees [facility] Dr. 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   Metastatic breast cancer with liver radiation 03/2021-intra-abdominal hematoma-is on fulvestrant  injections every 4 weekly Last imaging 01/23/2023 stable hepatic osseous metastatic disease--PET scan 04/2023 showed hypermetabolic disease  Anxiety/depression Continue Prozac  20, Ativan  0.5 every 6 as needed anxiety   DVT prophylaxis: Prophylactic Lovenox   Status is: Inpatient Remains inpatient appropriate because:   Awaiting insurance authorizations for rehab  Subjective: Pain control sub-optimal overnight.  Overall is better today Tells me at baseline has 2-4 stool daily 2/2 her crohn's   Objective + exam Vitals:   08/18/23 0814 08/18/23 1344 08/18/23 2028 08/19/23 0411  BP: 118/62 (!) 110/46 134/66 (!) 124/59  Pulse: 73 79 78 80  Resp: 18 17 15 18   Temp: 98.4 F (36.9 C) 98.6 F (37 C) 98.3 F (36.8 C) 98.2 F (36.8 C)  TempSrc: Oral  Oral   SpO2: 94% 99% 98% 96%  Weight:      Height:       Filed Weights   08/15/23 0524 08/16/23 0500 08/18/23 0500  Weight: 77.2 kg 81.1 kg 80.8 kg    Examination: Alert coherent no distress EOMI NCAT S1-S2 no murmur no rub no gallop CTAB no added sound Some tachycardia Power 5/5 limited on left side she has a hematoma on her lower back Scab seems controlled Abdomen is soft no rebound postop changes Chest is clear  Data Reviewed: reviewed   CBC    Component Value Date/Time   WBC 10.3 08/16/2023 0429   RBC 3.06 (L) 08/16/2023 0429   HGB 9.9 (L) 08/16/2023 0429   HGB 14.0 07/16/2023 1119   HGB 13.7 12/06/2014 0804   HCT  29.0 (L) 08/16/2023 0429   HCT 41.1 12/06/2014 0804   PLT 151 08/16/2023 0429   PLT 209 07/16/2023 1119   PLT 303 12/06/2014 0804   MCV 94.8 08/16/2023 0429   MCV 95.8 12/06/2014 0804   MCH 32.4 08/16/2023 0429   MCHC 34.1 08/16/2023 0429   RDW 12.3  08/16/2023 0429   RDW 12.2 12/06/2014 0804   LYMPHSABS 1.7 08/14/2023 0109   LYMPHSABS 1.8 12/06/2014 0804   MONOABS 1.3 (H) 08/14/2023 0109   MONOABS 0.8 12/06/2014 0804   EOSABS 0.0 08/14/2023 0109   EOSABS 0.2 12/06/2014 0804   BASOSABS 0.1 08/14/2023 0109   BASOSABS 0.0 12/06/2014 0804      Latest Ref Rng & Units 08/16/2023    4:29 AM 08/15/2023    6:19 AM 08/14/2023    5:51 PM  CMP  Glucose 70 - 99 mg/dL 621  308    BUN 8 - 23 mg/dL 20  15    Creatinine 6.57 - 1.00 mg/dL 8.46  9.62  9.52   Sodium 135 - 145 mmol/L 132  130    Potassium 3.5 - 5.1 mmol/L 4.3  3.9    Chloride 98 - 111 mmol/L 103  100    CO2 22 - 32 mmol/L 23  21    Calcium  8.9 - 10.3 mg/dL 8.6  8.7    Total Protein 6.5 - 8.1 g/dL 5.0     Total Bilirubin 0.0 - 1.2 mg/dL 0.8     Alkaline Phos 38 - 126 U/L 40     AST 15 - 41 U/L 20     ALT 0 - 44 U/L 8       Scheduled Meds:  acetaminophen   650 mg Oral Q6H   Or   acetaminophen   650 mg Rectal Q6H   cholecalciferol   1,000 Units Oral Daily   docusate sodium   100 mg Oral BID   enoxaparin  (LOVENOX ) injection  40 mg Subcutaneous Q24H   FLUoxetine   20 mg Oral Daily   polyethylene glycol  17 g Oral Daily   predniSONE   5 mg Oral Daily   sodium chloride  flush  3-10 mL Intravenous Q12H   Continuous Infusions:  Time 35  Jai-Gurmukh Damoney Julia, MD  [facility]

## 2023-08-19 NOTE — Plan of Care (Signed)

## 2023-08-19 NOTE — Progress Notes (Signed)
 Occupational Therapy Treatment Patient Details Name: Sara Barnes MRN: [id number] DOB: [birth year] Today's Date: 08/19/2023   History of present illness 83 y.o. female presents to [facility] hospital on 08/14/2023 after fall and subsequent L hip fx. Pt underwent IM nailing on 4/18. PMH includes afib, breast CA, crohn's disease, HLD.   OT comments  Pt progressing well towards goals. Able to progress to ambulate to bathroom with CGA. Grooming and perineal hygiene completed in standing with improved dynamic standing balance. Pt highly motivated to continue work in therapy. Continues to be limited by pain, decreased strength, and ROM. Anticipate pt will progress well. Continue to recommend >3 hours of skilled rehab daily to return to PLOF. Will continue to follow acutely.       If plan is discharge home, recommend the following:  Assist for transportation;A lot of help with bathing/dressing/bathroom;A little help with walking and/or transfers;Help with stairs or ramp for entrance;Assistance with cooking/housework   Equipment Recommendations  BSC/3in1;Tub/shower seat    Recommendations for Other Services Rehab consult    Precautions / Restrictions Precautions Precautions: Fall Recall of Precautions/Restrictions: Intact Restrictions Weight Bearing Restrictions Per Provider Order: Yes LLE Weight Bearing Per Provider Order: Weight bearing as tolerated       Mobility Bed Mobility Overal bed mobility: Needs Assistance Bed Mobility: Supine to Sit     Supine to sit: Contact guard, HOB elevated, Used rails     General bed mobility comments: Cues for sequencing, increased time    Transfers Overall transfer level: Needs assistance Equipment used: Rolling walker (2 wheels) Transfers: Sit to/from Stand, Bed to chair/wheelchair/BSC Sit to Stand: Contact guard assist     Step pivot transfers: Contact guard assist     General transfer comment: CGA and increased time     Balance Overall  balance assessment: Needs assistance Sitting-balance support: No upper extremity supported, Feet supported Sitting balance-Leahy Scale: Good     Standing balance support: Bilateral upper extremity supported, During functional activity, Reliant on assistive device for balance Standing balance-Leahy Scale: Poor Standing balance comment: Reliant on RW       ADL either performed or assessed with clinical judgement   ADL Overall ADL's : Needs assistance/impaired     Grooming: Wash/dry hands;Contact guard assist;Standing Grooming Details (indicate cue type and reason): CGA for balance       Toilet Transfer: Contact guard assist;Ambulation;BSC/3in1;Rolling walker (2 wheels) Toilet Transfer Details (indicate cue type and reason): CGA for ambulation to bathroom BSC over toilet increased time Toileting- Clothing Manipulation and Hygiene: Contact guard assist;Sit to/from stand Toileting - Clothing Manipulation Details (indicate cue type and reason): Perineal hygiene performed in standing, good balance     Functional mobility during ADLs: Contact guard assist;Rolling walker (2 wheels) General ADL Comments: Good progression with balance and activity tolerance    Extremity/Trunk Assessment Upper Extremity Assessment Upper Extremity Assessment: Overall WFL for tasks assessed   Lower Extremity Assessment Lower Extremity Assessment: Defer to PT evaluation        Vision   Vision Assessment?: No apparent visual deficits         Communication Communication Communication: Impaired Factors Affecting Communication: Hearing impaired   Cognition Arousal: Alert Behavior During Therapy: WFL for tasks assessed/performed Cognition: No apparent impairments       Following commands: Intact        Cueing   Cueing Techniques: Verbal cues        General Comments Sister present for session, pt expressing concerns of falling  Pertinent Vitals/ Pain       Pain Assessment Pain  Assessment: Faces Faces Pain Scale: Hurts little more Pain Location: L hip Pain Descriptors / Indicators: Sore Pain Intervention(s): Monitored during session   Frequency  Min 2X/week        Progress Toward Goals  OT Goals(current goals can now be found in the care plan section)  Progress towards OT goals: Progressing toward goals  Acute Rehab OT Goals Patient Stated Goal: To go to rehab OT Goal Formulation: With patient Time For Goal Achievement: 08/28/23 Potential to Achieve Goals: Good ADL Goals Pt Will Perform Grooming: with supervision;standing Pt Will Perform Lower Body Dressing: with min assist;sit to/from stand;with adaptive equipment Pt Will Transfer to Toilet: with supervision;ambulating;regular height toilet  Plan         AM-PAC OT "6 Clicks" Daily Activity     Outcome Measure   Help from another person eating meals?: None Help from another person taking care of personal grooming?: None Help from another person toileting, which includes using toliet, bedpan, or urinal?: A Little Help from another person bathing (including washing, rinsing, drying)?: A Lot Help from another person to put on and taking off regular upper body clothing?: A Little Help from another person to put on and taking off regular lower body clothing?: A Lot 6 Click Score: 18    End of Session Equipment Utilized During Treatment: Gait belt;Rolling walker (2 wheels)  OT Visit Diagnosis: Unsteadiness on feet (R26.81);Muscle weakness (generalized) (M62.81);Pain Pain - Right/Left: Left Pain - part of body: Leg;Hip   Activity Tolerance Patient tolerated treatment well   Patient Left Other (comment);with family/visitor present (Hand off to PT)   Nurse Communication Mobility status        Time: 1610-9604 OT Time Calculation (min): 28 min  Charges: OT General Charges $OT Visit: 1 Visit OT Treatments $Self Care/Home Management : 23-37 mins  Delmer Ferraris, OT  Acute Rehabilitation  Services Office [phone] Secure chat preferred   Mickael Alamo 08/19/2023, 4:07 PM

## 2023-08-19 NOTE — NC FL2 (Signed)
 [place]  MEDICAID FL2 LEVEL OF CARE FORM     IDENTIFICATION  Patient Name: Sara Barnes Birthdate: 07-02-1940 Sex: female Admission Date (Current Location): 08/14/2023  [facility] and [place] Number:  Producer, television/film/video and Address:  [facility] [place], [address] [place], [place] [address]      Provider Number: [id number]  Attending Physician Name and Address:  Verlie Glisson, MD  Relative Name and Phone Number:  Pria, Klosinski [phone]  [phone]    Current Level of Care: Hospital Recommended Level of Care: Skilled Nursing Facility Prior Approval Number:    Date Approved/Denied:   PASRR Number: [id number]  Discharge Plan: SNF    Current Diagnoses: Patient Active Problem List   Diagnosis Date Noted   Closed left hip fracture (HCC) 08/14/2023   Anxiety about health 06/12/2022   Liver mass    Primary malignant neoplasm of breast with metastasis (HCC)    Pleural effusion    Status post thoracentesis    Malnutrition of moderate degree 05/10/2021   Clostridium difficile colitis    Intractable vomiting with nausea 04/19/2021   Left facial pain 11/15/2020   Chronic pansinusitis 11/12/2020   Primary insomnia 10/17/2020   Osteopenia of neck of left femur 03/26/2020   Preventative health care 03/26/2020   Closed fracture of shaft of right fibula with routine healing 02/02/2020   Hyponatremia 01/23/2020   Urinary frequency 01/23/2020   Dysphagia 01/23/2020   Acute pansinusitis 01/08/2018   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 06/02/2017 05/28/2017   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 05/21/2017   Right ear pain 05/19/2017   Dizziness 05/19/2017   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 03/24/2017   Chronic diastolic CHF (congestive heart failure) (HCC) 08/05/2016   Chest discomfort 06/21/2016   Nausea and vomiting    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    Small bowel obstruction (HCC)    Ileus (HCC)    Generalized anxiety disorder 02/14/2016   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   Hypokalemia    Fever    Crohn's disease of colon with complication (HCC)    Other depression due to general medical condition    Difficulty in walking, not elsewhere classified    Acute blood loss anemia    Absolute anemia    Left upper quadrant pain    Abdominal pain 01/07/2016   GI bleed 01/07/2016   IBS (irritable bowel syndrome)    Tenesmus    Ulcerative pancolitis with rectal bleeding (HCC)    Diarrhea 12/24/2015   Crohn disease (HCC) 12/24/2015   Dehydration    Ulcerative pancolitis with abscess (HCC)    Rectal pain    Leucocytosis    Cancer of lower-inner quadrant of left female breast (HCC) 11/30/2014   Perirectal abscess 09/29/2013   Vitamin B12 deficiency 08/12/2006   Anxiety and depression 08/12/2006    Orientation RESPIRATION BLADDER Height & Weight     Self, Time, Situation, Place  O2 Continent, External catheter Weight: 178 lb 2.1 oz (80.8 kg) Height:  5\' 6"  (167.6 cm)  BEHAVIORAL SYMPTOMS/MOOD NEUROLOGICAL BOWEL NUTRITION STATUS      Incontinent Diet (see discharge summary)  AMBULATORY STATUS COMMUNICATION OF NEEDS Skin   Limited Assist Verbally Surgical wounds                       Personal Care Assistance Level of Assistance  Bathing, Feeding, Dressing Bathing Assistance:  Limited assistance Feeding assistance: Independent Dressing Assistance: Maximum assistance     Functional Limitations Info  Sight, Hearing, Speech Sight Info: Adequate Hearing Info: Adequate Speech Info: Adequate    SPECIAL CARE FACTORS FREQUENCY  PT (By licensed PT), OT (By licensed OT)     PT Frequency: 5x week OT Frequency: 5x week            Contractures Contractures Info: Not present    Additional Factors Info  Code Status, Allergies Code Status Info: full Allergies Info: Nsaids, Codeine, Iodinated Contrast Media, Mesalamine ,  Omnipaque  (Iohexol ), Oxycodone , Lactose Intolerance (Gi), Norco (Hydrocodone -acetaminophen ), Other, Sulfa Antibiotics           Current Medications (08/19/2023):  This is the current hospital active medication list Current Facility-Administered Medications  Medication Dose Route Frequency Provider Last Rate Last Admin   acetaminophen  (TYLENOL ) tablet 650 mg  650 mg Oral Q6H Versie Gores, PA-C   650 mg at 08/14/23 1718   Or   acetaminophen  (TYLENOL ) suppository 650 mg  650 mg Rectal Q6H McClung, Sarah A, PA-C       alum & mag hydroxide-simeth (MAALOX/MYLANTA) 200-200-20 MG/5ML suspension 30 mL  30 mL Oral Q4H PRN Oral Billings, MD   30 mL at 08/17/23 1054   cholecalciferol  (VITAMIN D3) 25 MCG (1000 UNIT) tablet 1,000 Units  1,000 Units Oral Daily Versie Gores, PA-C   1,000 Units at 08/19/23 0750   diphenoxylate -atropine  (LOMOTIL ) 2.5-0.025 MG per tablet 2 tablet  2 tablet Oral QID PRN Samtani, Jai-Gurmukh, MD       enoxaparin  (LOVENOX ) injection 40 mg  40 mg Subcutaneous Q24H Alona Jamaica A, PA-C   40 mg at 08/19/23 8657   FLUoxetine  (PROZAC ) capsule 20 mg  20 mg Oral Daily Alona Jamaica A, PA-C   20 mg at 08/19/23 8469   HYDROmorphone  (DILAUDID ) injection 0.5 mg  0.5 mg Intravenous Q4H PRN Oral Billings, MD   0.5 mg at 08/19/23 1329   HYDROmorphone  (DILAUDID ) tablet 1 mg  1 mg Oral Q4H PRN Oral Billings, MD   1 mg at 08/19/23 1149   LORazepam  (ATIVAN ) injection 0.5 mg  0.5 mg Intravenous Q6H PRN Versie Gores, PA-C   0.5 mg at 08/14/23 0341   methocarbamol  (ROBAXIN ) tablet 500 mg  500 mg Oral Q6H PRN Versie Gores, PA-C   500 mg at 08/19/23 1047   Or   methocarbamol  (ROBAXIN ) injection 500 mg  500 mg Intravenous Q6H PRN Versie Gores, PA-C       ondansetron  (ZOFRAN ) tablet 4 mg  4 mg Oral Q6H PRN Versie Gores, PA-C   4 mg at 08/19/23 6295   Or   ondansetron  (ZOFRAN ) injection 4 mg  4 mg Intravenous Q6H PRN Versie Gores, PA-C       predniSONE  (DELTASONE )  tablet 5 mg  5 mg Oral Daily Versie Gores, PA-C   5 mg at 08/19/23 2841   simethicone  (MYLICON) chewable tablet 80 mg  80 mg Oral QID PRN Oral Billings, MD       sodium chloride  flush (NS) 0.9 % injection 3-10 mL  3-10 mL Intravenous Q12H Versie Gores, PA-C   5 mL at 08/19/23 3244   sodium chloride  flush (NS) 0.9 % injection 3-10 mL  3-10 mL Intravenous PRN Versie Gores, PA-C       zolpidem  (AMBIEN ) tablet 10 mg  10 mg Oral QHS PRN Alona Jamaica A, PA-C   10 mg  at 08/18/23 2105     Discharge Medications: Please see discharge summary for a list of discharge medications.  Relevant Imaging Results:  Relevant Lab Results:   Additional Information ss#[id number]  [name], LCSW

## 2023-08-20 DIAGNOSIS — S72002A Fracture of unspecified part of neck of left femur, initial encounter for closed fracture: Secondary | ICD-10-CM | POA: Diagnosis not present

## 2023-08-20 LAB — CBC WITH DIFFERENTIAL/PLATELET
Abs Immature Granulocytes: 0.1 10*3/uL — ABNORMAL HIGH (ref 0.00–0.07)
Basophils Absolute: 0 10*3/uL (ref 0.0–0.1)
Basophils Relative: 1 %
Eosinophils Absolute: 0.1 10*3/uL (ref 0.0–0.5)
Eosinophils Relative: 1 %
HCT: 29.8 % — ABNORMAL LOW (ref 36.0–46.0)
Hemoglobin: 10 g/dL — ABNORMAL LOW (ref 12.0–15.0)
Immature Granulocytes: 1 %
Lymphocytes Relative: 16 %
Lymphs Abs: 1.4 10*3/uL (ref 0.7–4.0)
MCH: 31.9 pg (ref 26.0–34.0)
MCHC: 33.6 g/dL (ref 30.0–36.0)
MCV: 95.2 fL (ref 80.0–100.0)
Monocytes Absolute: 1 10*3/uL (ref 0.1–1.0)
Monocytes Relative: 11 %
Neutro Abs: 6 10*3/uL (ref 1.7–7.7)
Neutrophils Relative %: 70 %
Platelets: 238 10*3/uL (ref 150–400)
RBC: 3.13 MIL/uL — ABNORMAL LOW (ref 3.87–5.11)
RDW: 12.3 % (ref 11.5–15.5)
WBC: 8.7 10*3/uL (ref 4.0–10.5)
nRBC: 0 % (ref 0.0–0.2)

## 2023-08-20 LAB — BASIC METABOLIC PANEL WITH GFR
Anion gap: 11 (ref 5–15)
BUN: 15 mg/dL (ref 8–23)
CO2: 24 mmol/L (ref 22–32)
Calcium: 8.5 mg/dL — ABNORMAL LOW (ref 8.9–10.3)
Chloride: 96 mmol/L — ABNORMAL LOW (ref 98–111)
Creatinine, Ser: 0.84 mg/dL (ref 0.44–1.00)
GFR, Estimated: >60 mL/min (ref >60)
Glucose, Bld: 100 mg/dL — ABNORMAL HIGH (ref 70–99)
Potassium: 3.9 mmol/L (ref 3.5–5.1)
Sodium: 131 mmol/L — ABNORMAL LOW (ref 135–145)

## 2023-08-20 MED ORDER — METHOCARBAMOL 500 MG PO TABS: 500.0000 mg | ORAL_TABLET | Freq: Four times a day (QID) | ORAL | 0 refills | Status: DC | PRN

## 2023-08-20 MED ORDER — METOCLOPRAMIDE HCL 5 MG PO TABS: 5.0000 mg | ORAL_TABLET | Freq: Three times a day (TID) | ORAL | 0 refills | Status: DC | PRN

## 2023-08-20 MED ORDER — TRAMADOL HCL 50 MG PO TABS: 50.0000 mg | ORAL_TABLET | Freq: Four times a day (QID) | ORAL | Status: DC

## 2023-08-20 MED ORDER — VITAMIN D3 25 MCG PO TABS: 1000.0000 [IU] | ORAL_TABLET | Freq: Every day | ORAL | 0 refills | Status: DC

## 2023-08-20 MED ORDER — OXYCODONE HCL 5 MG PO TABS
5.0000 mg | ORAL_TABLET | ORAL | 0 refills | Status: DC | PRN
Start: 2023-08-20 — End: 2023-08-25

## 2023-08-20 MED ORDER — ASPIRIN 81 MG PO TBEC: 81.0000 mg | DELAYED_RELEASE_TABLET | Freq: Every day | ORAL | 0 refills | Status: DC

## 2023-08-20 MED ORDER — OXYCODONE HCL 5 MG PO TABS
10.0000 mg | ORAL_TABLET | ORAL | Status: DC
Start: 2023-08-20 — End: 2023-08-20

## 2023-08-20 MED ORDER — OXYCODONE HCL 5 MG PO TABS
10.0000 mg | ORAL_TABLET | Freq: Four times a day (QID) | ORAL | Status: DC
  Administered 2023-08-20 – 2023-08-21 (×4): 10 mg via ORAL
  Filled 2023-08-20 (×4): qty 2

## 2023-08-20 MED ORDER — METOCLOPRAMIDE HCL 5 MG PO TABS
5.0000 mg | ORAL_TABLET | Freq: Three times a day (TID) | ORAL | Status: DC | PRN
Start: 2023-08-20 — End: 2023-08-20

## 2023-08-20 MED ORDER — HYDROMORPHONE HCL 2 MG PO TABS
1.0000 mg | ORAL_TABLET | ORAL | Status: DC | PRN
  Administered 2023-08-20 – 2023-08-24 (×3): 1 mg via ORAL
  Filled 2023-08-20 (×4): qty 1

## 2023-08-20 MED ORDER — ENOXAPARIN SODIUM 40 MG/0.4ML IJ SOSY: 40.0000 mg | PREFILLED_SYRINGE | INTRAMUSCULAR | 0 refills | Status: AC

## 2023-08-20 NOTE — Progress Notes (Signed)
 [facility] ROUNDING NOTE DEAJA RIZO [id number]  DOB: Nov 25, 1940  DOA: 08/14/2023  PCP: Estill Hemming, DO  08/20/2023,11:27 AM  LOS: 6 days    Code Status: Full code   from: Home current Dispo:?  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  C. difficile colitis 2023 Metastatic breast cancer with liver biopsy 03/2021 and subsequent hematoma which self resolved-bony mets to chest with complications of left-sided pleural effusions previously Paroxysmal A-fib CHA2DS2-VASc >4-not a candidate previously for anticoagulation 2/2 hematoma  4/18 ground-level mechanical fall without LOC in the setting of being startled by an insect-left intertrochanteric fracture noted with subtrochanteric femur fracture additionally orthopedics Dr. Hiram Lukes consulted 4/18 cephalomedullary nailing left intertrochanteric femoral fracture--- WBAT   Plan  Left hip fracture-repair as above 4/18 Component of anxiety about a ability to perform therapy also worsening some symptoms Discontinue tramadol  completely 4/23, Oxy IR 10 every 6 hours scheduled for now-Dilaudid  1 mg every 4 as needed for severe 7-10 pain, if not controlled with this continue Dilaudid  0.5 every 4 as needed for pain not controlled by oral meds Will give Robaxin  500 every 6 as needed for spasm  Paroxysmal atrial fibrillation CHA2DS2-VASc 4, has bled score 3 -not a candidate for anticoagulation secondary to previous intra-abdominal hematoma from liver biopsy Anticoagulation to be revisited as outpatient by cardiology  Diarrhea subacute/chronic in setting of underlying Crohn's Stop Reglan  5-10 every 8 as needed nausea and MiraLAX  17 g daily, Colace 100 twice daily given she has underlying Crohn's At home  on Lomotil  1-2 , 3-4 times daily for  diarrhea--- she has had no further diarrhea  Underlying Crohn's disease with prior colonoscopy prior surgeries and SBO--sees [facility] Dr. 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   Metastatic breast cancer with liver radiation 03/2021-intra-abdominal hematoma-is on fulvestrant  injections every 4 weekly Last imaging 01/23/2023 stable hepatic osseous metastatic disease--PET scan 04/2023 showed hypermetabolic disease  Anxiety/depression Continue Prozac  20, Ativan  0.5 every 6 as needed anxiety   DVT prophylaxis: Prophylactic Lovenox   Status is: Inpatient Remains inpatient appropriate because:   Awaiting insurance authorizations for rehab  Subjective:  Pain reasonable today 610 I had a discussion with orthopedics and we came up with what we think is a good regimen She overall is anxious today and worried about being able to work with therapy sites prior experiences as being fear-inducing--we tried to reassure her-her sister is at the bedside  Objective + exam Vitals:   08/19/23 1607 08/19/23 2028 08/20/23 0539 08/20/23 0915  BP: 127/62 121/75 115/62 (!) 109/52  Pulse: 90 82 79 77  Resp: 17 17 17 16   Temp: 98.6 F (37 C) 97.6 F (36.4 C) 97.8 F (36.6 C) 98.4 F (36.9 C)  TempSrc: Oral Oral Oral Oral  SpO2: 96% 97% 95% 98%  Weight:      Height:       Filed Weights   08/15/23 0524 08/16/23 0500 08/18/23 0500  Weight: 77.2 kg 81.1 kg 80.8 kg    Examination:  Anxious coherent pleasant white female Chest clear no wheeze S1-S2 no murmur Abdomen is soft no rebound no guarding ROM grossly intact wound on left hip seems clean No lower extremity swelling   Data Reviewed: reviewed   CBC    Component Value Date/Time   WBC 8.7 08/20/2023 0604   RBC  3.13 (L) 08/20/2023 0604   HGB 10.0 (L) 08/20/2023 0604   HGB 14.0 07/16/2023 1119   HGB 13.7 12/06/2014 0804   HCT 29.8 (L) 08/20/2023 0604   HCT 41.1 12/06/2014 0804   PLT 238  08/20/2023 0604   PLT 209 07/16/2023 1119   PLT 303 12/06/2014 0804   MCV 95.2 08/20/2023 0604   MCV 95.8 12/06/2014 0804   MCH 31.9 08/20/2023 0604   MCHC 33.6 08/20/2023 0604   RDW 12.3 08/20/2023 0604   RDW 12.2 12/06/2014 0804   LYMPHSABS 1.4 08/20/2023 0604   LYMPHSABS 1.8 12/06/2014 0804   MONOABS 1.0 08/20/2023 0604   MONOABS 0.8 12/06/2014 0804   EOSABS 0.1 08/20/2023 0604   EOSABS 0.2 12/06/2014 0804   BASOSABS 0.0 08/20/2023 0604   BASOSABS 0.0 12/06/2014 0804      Latest Ref Rng & Units 08/20/2023    6:04 AM 08/19/2023    7:04 AM 08/16/2023    4:29 AM  CMP  Glucose 70 - 99 mg/dL 161  97  096   BUN 8 - 23 mg/dL 15  14  20    Creatinine 0.44 - 1.00 mg/dL 0.45  4.09  8.11   Sodium 135 - 145 mmol/L 131  132  132   Potassium 3.5 - 5.1 mmol/L 3.9  4.3  4.3   Chloride 98 - 111 mmol/L 96  96  103   CO2 22 - 32 mmol/L 24  26  23    Calcium  8.9 - 10.3 mg/dL 8.5  8.5  8.6   Total Protein 6.5 - 8.1 g/dL  5.4  5.0   Total Bilirubin 0.0 - 1.2 mg/dL  1.0  0.8   Alkaline Phos 38 - 126 U/L  52  40   AST 15 - 41 U/L  30  20   ALT 0 - 44 U/L  18  8     Scheduled Meds:  acetaminophen   650 mg Oral Q6H   Or   acetaminophen   650 mg Rectal Q6H   cholecalciferol   1,000 Units Oral Daily   enoxaparin  (LOVENOX ) injection  40 mg Subcutaneous Q24H   FLUoxetine   20 mg Oral Daily   oxyCODONE   10 mg Oral Q6H   predniSONE   5 mg Oral Daily   sodium chloride  flush  3-10 mL Intravenous Q12H   Continuous Infusions:  Time 35  Jai-Gurmukh Missy Baksh, MD  [facility]

## 2023-08-20 NOTE — Progress Notes (Addendum)
 Physical Therapy Treatment Patient Details Name: Sara Barnes MRN: [id number] DOB: 08-13-1940 Today's Date: 08/20/2023   History of Present Illness 83 y.o. female presents to [facility] hospital on 08/14/2023 after fall and subsequent L hip fx. Pt underwent IM nailing on 4/18. PMH includes afib, breast CA, crohn's disease, HLD.    PT Comments  Pt resting in bed upon arrival to room, endorses feeling sore and stiff in L hip this date. Pt ambulatory for short hallway distance, pt with worsening L hip pain and + diaphoresis, nausea, min lightheadedness (BP and HR 128/82 and 88, respectively). Pt also limited by need to have BM x2 during session, PT min assisting with pericare. Pt and family now on board for SNF post-acutely. Will continue to follow.     If plan is discharge home, recommend the following: A little help with walking and/or transfers;A lot of help with bathing/dressing/bathroom;Assistance with cooking/housework;Assist for transportation;Help with stairs or ramp for entrance   Can travel by private vehicle      yes  Equipment Recommendations  BSC/3in1;Wheelchair (measurements PT)    Recommendations for Other Services       Precautions / Restrictions Precautions Precautions: Fall Recall of Precautions/Restrictions: Intact Restrictions Weight Bearing Restrictions Per Provider Order: Yes LLE Weight Bearing Per Provider Order: Weight bearing as tolerated     Mobility  Bed Mobility Overal bed mobility: Needs Assistance Bed Mobility: Supine to Sit     Supine to sit: HOB elevated, Used rails, Min assist     General bed mobility comments: assist for LE progression to EOB, R foot hooked under L to aid in this    Transfers Overall transfer level: Needs assistance Equipment used: Rolling walker (2 wheels) Transfers: Sit to/from Stand Sit to Stand: Contact guard assist           General transfer comment: CGA and increased time    Ambulation/Gait Ambulation/Gait  assistance: Contact guard assist Gait Distance (Feet): 50 Feet (+10 to get to bathroom) Assistive device: Rolling walker (2 wheels) Gait Pattern/deviations: Step-through pattern, Decreased stride length, Trunk flexed, Antalgic Gait velocity: decr     General Gait Details: for safety, cues for placement in RW, upright posture. increasingly antalgic gait with further distance, also reporting dizziness, nausea, and pallor   Stairs             Wheelchair Mobility     Tilt Bed    Modified Rankin (Stroke Patients Only)       Balance Overall balance assessment: Needs assistance Sitting-balance support: No upper extremity supported, Feet supported Sitting balance-Leahy Scale: Good     Standing balance support: Bilateral upper extremity supported, During functional activity, Reliant on assistive device for balance Standing balance-Leahy Scale: Poor Standing balance comment: Reliant on RW                            Communication Communication Communication: Impaired Factors Affecting Communication: Hearing impaired  Cognition Arousal: Alert Behavior During Therapy: Anxious   PT - Cognitive impairments: Safety/Judgement, Sequencing                       PT - Cognition Comments: sequencing and safety cues throughout mobility Following commands: Intact      Cueing Cueing Techniques: Verbal cues  Exercises      General Comments        Pertinent Vitals/Pain Pain Assessment Pain Assessment: Faces Faces Pain Scale: Hurts even more Pain  Location: L hip Pain Descriptors / Indicators: Sore, Guarding, Grimacing Pain Intervention(s): Limited activity within patient's tolerance, Monitored during session, Repositioned    Home Living                          Prior Function            PT Goals (current goals can now be found in the care plan section) Acute Rehab PT Goals Patient Stated Goal: to regain independence PT Goal Formulation:  With patient Time For Goal Achievement: 08/29/23 Potential to Achieve Goals: Good Progress towards PT goals: Progressing toward goals    Frequency    Min 3X/week      PT Plan      Co-evaluation              AM-PAC PT "6 Clicks" Mobility   Outcome Measure  Help needed turning from your back to your side while in a flat bed without using bedrails?: A Little Help needed moving from lying on your back to sitting on the side of a flat bed without using bedrails?: A Little Help needed moving to and from a bed to a chair (including a wheelchair)?: A Little Help needed standing up from a chair using your arms (e.g., wheelchair or bedside chair)?: A Little Help needed to walk in hospital room?: A Little Help needed climbing 3-5 steps with a railing? : A Lot 6 Click Score: 17    End of Session   Activity Tolerance: Patient tolerated treatment well Patient left: in chair;with call bell/phone within reach;with chair alarm set;with family/visitor present Nurse Communication: Mobility status PT Visit Diagnosis: Other abnormalities of gait and mobility (R26.89);Muscle weakness (generalized) (M62.81);History of falling (Z91.81)     Time: 1610-9604 PT Time Calculation (min) (ACUTE ONLY): 38 min  Charges:    $Gait Training: 23-37 mins $Therapeutic Activity: 8-22 mins PT General Charges $$ ACUTE PT VISIT: 1 Visit                     Shirlene Doughty, PT DPT Acute Rehabilitation Services Secure Chat Preferred  Office [phone]    Chasyn Cinque E Stroup 08/20/2023, 1:15 PM

## 2023-08-20 NOTE — Progress Notes (Addendum)
 Orthopaedic Trauma Progress Note  SUBJECTIVE: Doing ok today.  Continues to have a  difficult time with pain control. States she can't stay on a good schedule and feels like she has to wait a long time when she calls for medication.  We discussed scheduling oxycodone  to assist with baseline pain control. This will hopefully allow her to require less of the PRN medications.  Making progress with therapies. No chest pain. No SOB. No nausea/vomiting. No other complaints.  Tolerating diet and fluids.  Patient's sister at bedside.  CIR was denied following appeal yesterday.  Family now interested in SNF.  OBJECTIVE:  Vitals:   08/20/23 0539 08/20/23 0915  BP: 115/62 (!) 109/52  Pulse: 79 77  Resp: 17 16  Temp: 97.8 F (36.6 C) 98.4 F (36.9 C)  SpO2: 95% 98%    Opiates Today (MME): Today's  total administered Morphine  Milligram Equivalents: 40 Opiates Yesterday (MME): Yesterday's total administered Morphine  Milligram Equivalents: 61  General: Sitting up in bed.  No acute distress Respiratory: No increased work of breathing.  Operative Extremity (LLE): Incisions CDI. Soreness over the hip as expected. Ankle DF/PF intact. Endorses sensation throughout extremity. +DP pulse  IMAGING: Stable post op imaging.   LABS:  Results for orders placed or performed during the hospital encounter of 08/14/23 (from the past 24 hours)  Basic metabolic panel with GFR     Status: Abnormal   Collection Time: 08/20/23  6:04 AM  Result Value Ref Range   Sodium 131 (L) 135 - 145 mmol/L   Potassium 3.9 3.5 - 5.1 mmol/L   Chloride 96 (L) 98 - 111 mmol/L   CO2 24 22 - 32 mmol/L   Glucose, Bld 100 (H) 70 - 99 mg/dL   BUN 15 8 - 23 mg/dL   Creatinine, Ser 4.09 0.44 - 1.00 mg/dL   Calcium  8.5 (L) 8.9 - 10.3 mg/dL   GFR, Estimated >81 >19 mL/min   Anion gap 11 5 - 15  CBC with Differential/Platelet     Status: Abnormal   Collection Time: 08/20/23  6:04 AM  Result Value Ref Range   WBC 8.7 4.0 - 10.5 K/uL    RBC 3.13 (L) 3.87 - 5.11 MIL/uL   Hemoglobin 10.0 (L) 12.0 - 15.0 g/dL   HCT 14.7 (L) 82.9 - 56.2 %   MCV 95.2 80.0 - 100.0 fL   MCH 31.9 26.0 - 34.0 pg   MCHC 33.6 30.0 - 36.0 g/dL   RDW 13.0 86.5 - 78.4 %   Platelets 238 150 - 400 K/uL   nRBC 0.0 0.0 - 0.2 %   Neutrophils Relative % 70 %   Neutro Abs 6.0 1.7 - 7.7 K/uL   Lymphocytes Relative 16 %   Lymphs Abs 1.4 0.7 - 4.0 K/uL   Monocytes Relative 11 %   Monocytes Absolute 1.0 0.1 - 1.0 K/uL   Eosinophils Relative 1 %   Eosinophils Absolute 0.1 0.0 - 0.5 K/uL   Basophils Relative 1 %   Basophils Absolute 0.0 0.0 - 0.1 K/uL   Immature Granulocytes 1 %   Abs Immature Granulocytes 0.10 (H) 0.00 - 0.07 K/uL   *Note: Due to a large number of results and/or encounters for the requested time period, some results have not been displayed. A complete set of results can be found in Results Review.    ASSESSMENT: Sara Barnes is a 83 y.o. female, 6 Days Post-Op s/p fall Procedures: INTRAMEDULLARY NAIL LEFT INTERTROCHANTERIC/SUBTROCHANTERIC FEMUR FRACTURE  CV/Blood loss:  Acute blood loss anemia, Hgb 10.0 this morning. Hemodynamically stable  PLAN: Weightbearing: WBAT LLE ROM: Unrestricted ROM Incisional and dressing care: Okay to leave incisions open to air Showering: Okay to begin getting incisions wet Orthopedic device(s): None  Pain management:  1. Tylenol  650 mg q 6 hours scheduled 2. Robaxin  500 mg q 6 hours PRN 3. Oxycodone  10 mg q 6 hours scheduled 4. Dilaudid  0.5 mg IV q 4 hours PRN 5. Dilaudid  1 mg PO q 4 hours PRN VTE prophylaxis: Lovenox , SCDs ID:  Ancef  2gm post op completed Foley/Lines:  No foley, KVO IVFs Impediments to Fracture Healing: Vitamin D  level 22, started on supplementation. Dispo: Continue working on pain control.  PT/OT evaluation ongoing, CIR denied following appeal.  TOC following for SNF placement.  Okay for discharge from ortho standpoint.    D/C recommendations: - Oxycodone , Tylenol , Robaxin  for  pain control - Lovenox  x 30 days for DVT prophylaxis - Continue 1000 units Vit D supplementation daily x 90 days  Follow - up plan: 2 weeks after d/c for wound check and repeat x-rays   Contact information:  Katheryne Pane MD, Alona Jamaica PA-C. After hours and holidays please check [email or web address] for group call information for [facility]   Edilia Gordon, PA-C [phone] (office) [email or web address]

## 2023-08-20 NOTE — Plan of Care (Signed)

## 2023-08-21 DIAGNOSIS — S72002A Fracture of unspecified part of neck of left femur, initial encounter for closed fracture: Secondary | ICD-10-CM | POA: Diagnosis not present

## 2023-08-21 LAB — BASIC METABOLIC PANEL WITH GFR
Anion gap: 11 (ref 5–15)
BUN: 18 mg/dL (ref 8–23)
CO2: 24 mmol/L (ref 22–32)
Calcium: 8.8 mg/dL — ABNORMAL LOW (ref 8.9–10.3)
Chloride: 98 mmol/L (ref 98–111)
Creatinine, Ser: 0.83 mg/dL (ref 0.44–1.00)
GFR, Estimated: >60 mL/min (ref >60)
Glucose, Bld: 93 mg/dL (ref 70–99)
Potassium: 4.2 mmol/L (ref 3.5–5.1)
Sodium: 133 mmol/L — ABNORMAL LOW (ref 135–145)

## 2023-08-21 LAB — CBC WITH DIFFERENTIAL/PLATELET
Abs Immature Granulocytes: 0.12 10*3/uL — ABNORMAL HIGH (ref 0.00–0.07)
Basophils Absolute: 0 10*3/uL (ref 0.0–0.1)
Basophils Relative: 1 %
Eosinophils Absolute: 0.1 10*3/uL (ref 0.0–0.5)
Eosinophils Relative: 2 %
HCT: 28.5 % — ABNORMAL LOW (ref 36.0–46.0)
Hemoglobin: 9.5 g/dL — ABNORMAL LOW (ref 12.0–15.0)
Immature Granulocytes: 2 %
Lymphocytes Relative: 19 %
Lymphs Abs: 1.6 10*3/uL (ref 0.7–4.0)
MCH: 32.3 pg (ref 26.0–34.0)
MCHC: 33.3 g/dL (ref 30.0–36.0)
MCV: 96.9 fL (ref 80.0–100.0)
Monocytes Absolute: 1 10*3/uL (ref 0.1–1.0)
Monocytes Relative: 12 %
Neutro Abs: 5.4 10*3/uL (ref 1.7–7.7)
Neutrophils Relative %: 64 %
Platelets: 239 10*3/uL (ref 150–400)
RBC: 2.94 MIL/uL — ABNORMAL LOW (ref 3.87–5.11)
RDW: 12 % (ref 11.5–15.5)
WBC: 8.2 10*3/uL (ref 4.0–10.5)
nRBC: 0 % (ref 0.0–0.2)

## 2023-08-21 MED ORDER — OXYCODONE HCL 5 MG PO TABS
10.0000 mg | ORAL_TABLET | ORAL | Status: DC
  Administered 2023-08-21 – 2023-08-24 (×18): 10 mg via ORAL
  Administered 2023-08-24: 5 mg via ORAL
  Administered 2023-08-25 (×3): 10 mg via ORAL
  Filled 2023-08-21 (×24): qty 2

## 2023-08-21 NOTE — TOC Progression Note (Signed)
 Transition of Care [facility]) - Progression Note    Patient Details  Name: Sara Barnes MRN: [id number] Date of Birth: 06-Mar-1941  Transition of [facility]) CM/SW Contact  [name], LCSW Phone Number: 08/21/2023, 3:32 PM  Clinical Narrative:    Pt does have SNF bed offers.  Pt still waiting on CIR appeal.    Expected Discharge Plan: IP Rehab Facility Barriers to Discharge: Continued Medical Work up, SNF Pending bed offer, Other (must enter comment) (CIR auth denial under appeal)  Expected Discharge Plan and Services In-house Referral: Clinical Social Work   Post Acute Care Choice: IP Rehab Living arrangements for the past 2 months: Single Family Home                                       Social Determinants of Health (SDOH) Interventions SDOH Screenings   Food Insecurity: No Food Insecurity (08/14/2023)  Housing: Low Risk  (08/14/2023)  Transportation Needs: No Transportation Needs (08/14/2023)  Utilities: Not At Risk (08/14/2023)  Alcohol Screen: Low Risk  (03/26/2021)  Depression (PHQ2-9): Low Risk  (03/28/2022)  Financial Resource Strain: Low Risk  (03/26/2021)  Physical Activity: Sufficiently Active (03/26/2021)  Social Connections: Moderately Isolated (08/14/2023)  Stress: No Stress Concern Present (03/26/2021)  Tobacco Use: Low Risk  (08/14/2023)    Readmission Risk Interventions     No data to display

## 2023-08-21 NOTE — Progress Notes (Addendum)
 Inpatient Rehabilitation Admissions Coordinator   I continue to await [facility] Medicare appeal determination for possible Cir admit. I contacted appeals yesterday and stated pending.  Jeannetta Millman, RN, MSN Rehab Admissions Coordinator [phone] 08/21/2023 8:35 AM  I have provided appeals department at [facility] updated clinicals since 4/23.  Jeannetta Millman, RN, MSN Rehab Admissions Coordinator [phone] 08/21/2023 10:39 AM

## 2023-08-21 NOTE — Progress Notes (Signed)
 Inpatient Rehabilitation Admissions Coordinator   Patient has received a denial on her appeal with [facility] Medicare for Cir admit. I met with patient and she is aware. We will sign off. Other rehab venues should be pursued.  Jeannetta Millman, RN, MSN Rehab Admissions Coordinator [phone] 08/21/2023 5:51 PM

## 2023-08-21 NOTE — Progress Notes (Signed)
 Mobility Specialist Progress Note:   08/21/23 1500  Mobility  Activity Ambulated with assistance in hallway  Level of Assistance Contact guard assist, steadying assist  Assistive Device Front wheel walker  Distance Ambulated (ft) 60 ft  LLE Weight Bearing Per Provider Order WBAT  Activity Response Tolerated well  Mobility Referral Yes  Mobility visit 1 Mobility  Mobility Specialist Start Time (ACUTE ONLY) 1512  Mobility Specialist Stop Time (ACUTE ONLY) 1535  Mobility Specialist Time Calculation (min) (ACUTE ONLY) 23 min   Pt received in bed and agreeable. Required only contact guard throughout. Asymptomatic w/ no complaints throughout ambulation. Successful void in BR at EOS. Performed peri care independently. Pt left in bed with call bell and all needs met.  D'Vante Nolon Baxter Mobility Specialist Please contact via Special educational needs teacher or Rehab office at [phone]

## 2023-08-21 NOTE — Plan of Care (Signed)

## 2023-08-21 NOTE — Progress Notes (Signed)
 TRH ROUNDING NOTE Sara Barnes OHY:[id number]  DOB: 11-09-1940  DOA: 08/14/2023  PCP: Estill Hemming, DO  08/21/2023,1:01 PM  LOS: 7 days    Code Status: Full code   from: Home current Dispo:?  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  C. difficile colitis 2023 Metastatic breast cancer with liver biopsy 03/2021 and subsequent hematoma which self resolved-bony mets to chest with complications of left-sided pleural effusions previously Paroxysmal A-fib CHA2DS2-VASc >4-not a candidate previously for anticoagulation 2/2 hematoma  4/18 ground-level mechanical fall without LOC in the setting of being startled by an insect-left intertrochanteric fracture noted with subtrochanteric femur fracture additionally orthopedics Dr. Hiram Lukes consulted 4/18 cephalomedullary nailing left intertrochanteric femoral fracture--- WBAT   Plan  Left hip fracture-repair as above 4/18 Discontinued tramadol  completely 4/23, Oxy IR 10 every 6 hours scheduled -Dilaudid  1 mg po every 4 as needed for severe 7-10 pain, for breakthrough IV Dilaudid  0.5 every 4 as needed  Robaxin  500 every 6 as needed for spasm  Paroxysmal atrial fibrillation CHA2DS2-VASc 4, has bled score 3 -not a candidate for anticoagulation secondary to previous intra-abdominal hematoma from liver biopsy Anticoagulation to be revisited as outpatient by cardiology  Diarrhea subacute/chronic in setting of underlying Crohn's Stop Reglan   MiraLAX  17 g daily, Colace 100 twice daily as at baseline has diarrhea Resumed home Lomotil  1-2 , 3-4 /d for diarrhea--- she has had no further diarrhea  Underlying Crohn's disease with prior colonoscopy prior surgeries and SBO--sees [place] [facility] Dr. 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   Metastatic breast cancer with liver radiation 03/2021-intra-abdominal hematoma-is on fulvestrant  injections every 4 weekly Last imaging 01/23/2023 stable hepatic osseous metastatic disease--PET scan 04/2023 showed hypermetabolic disease  Anxiety/depression Continue Prozac  20, Ativan  0.5 every 6 as needed anxiety She has underlying anxiety about progress with therapy and we tried to reassure her   DVT prophylaxis: Prophylactic Lovenox   Status is: Inpatient Remains inpatient appropriate because:   Awaiting insurance authorizations for rehab  Subjective:  Patient overall looks better pain is moderately controlled about a 6 on 10 She has no fever no nausea no vomiting We discussed that the p.o. weight is not a good long-term solution and that wherever she goes she needs to be mobile and get up and out of bed for toileting   Objective + exam Vitals:   08/20/23 1500 08/20/23 1948 08/21/23 0437 08/21/23 0900  BP: 108/60 (!) 113/54 (!) 110/59 (!) 101/59  Pulse: 81 78 71 76  Resp: 20 18 16 16   Temp: 98.7 F (37.1 C) 98 F (36.7 C) 97.7 F (36.5 C) (!) 97.5 F (36.4 C)  TempSrc: Oral Oral Oral Oral  SpO2: 99% 92% 98% 99%  Weight:      Height:       Filed Weights   08/15/23 0524 08/16/23 0500 08/18/23 0500  Weight: 77.2 kg 81.1 kg 80.8 kg    Examination:  EOMI NCAT no focal deficit no icterus no pallor no wheeze abdomen soft no rebound no guarding ROM intact CTAB no added sound Hip seems to have bruise on the left side left overall not as antalgic Perfusing well lower extremities Psych euthymic less anxious  Data Reviewed: reviewed   CBC    Component Value Date/Time   WBC 8.2 08/21/2023 0423   RBC 2.94 (L)  08/21/2023 0423   HGB 9.5 (L) 08/21/2023 0423   HGB 14.0 07/16/2023 1119   HGB 13.7 12/06/2014 0804   HCT 28.5 (L) 08/21/2023 0423   HCT 41.1 12/06/2014 0804   PLT 239 08/21/2023 0423   PLT 209 07/16/2023 1119   PLT 303 12/06/2014 0804    MCV 96.9 08/21/2023 0423   MCV 95.8 12/06/2014 0804   MCH 32.3 08/21/2023 0423   MCHC 33.3 08/21/2023 0423   RDW 12.0 08/21/2023 0423   RDW 12.2 12/06/2014 0804   LYMPHSABS 1.6 08/21/2023 0423   LYMPHSABS 1.8 12/06/2014 0804   MONOABS 1.0 08/21/2023 0423   MONOABS 0.8 12/06/2014 0804   EOSABS 0.1 08/21/2023 0423   EOSABS 0.2 12/06/2014 0804   BASOSABS 0.0 08/21/2023 0423   BASOSABS 0.0 12/06/2014 0804      Latest Ref Rng & Units 08/21/2023    4:23 AM 08/20/2023    6:04 AM 08/19/2023    7:04 AM  CMP  Glucose 70 - 99 mg/dL 93  161  97   BUN 8 - 23 mg/dL 18  15  14    Creatinine 0.44 - 1.00 mg/dL 0.96  0.45  4.09   Sodium 135 - 145 mmol/L 133  131  132   Potassium 3.5 - 5.1 mmol/L 4.2  3.9  4.3   Chloride 98 - 111 mmol/L 98  96  96   CO2 22 - 32 mmol/L 24  24  26    Calcium  8.9 - 10.3 mg/dL 8.8  8.5  8.5   Total Protein 6.5 - 8.1 g/dL   5.4   Total Bilirubin 0.0 - 1.2 mg/dL   1.0   Alkaline Phos 38 - 126 U/L   52   AST 15 - 41 U/L   30   ALT 0 - 44 U/L   18     Scheduled Meds:  acetaminophen   650 mg Oral Q6H   Or   acetaminophen   650 mg Rectal Q6H   cholecalciferol   1,000 Units Oral Daily   enoxaparin  (LOVENOX ) injection  40 mg Subcutaneous Q24H   FLUoxetine   20 mg Oral Daily   oxyCODONE   10 mg Oral Q6H   predniSONE   5 mg Oral Daily   sodium chloride  flush  3-10 mL Intravenous Q12H   Continuous Infusions:  Time 25  Sara Rachyl Wuebker, MD  [facility]

## 2023-08-22 DIAGNOSIS — S72002A Fracture of unspecified part of neck of left femur, initial encounter for closed fracture: Secondary | ICD-10-CM | POA: Diagnosis not present

## 2023-08-22 LAB — CBC WITH DIFFERENTIAL/PLATELET
Abs Immature Granulocytes: 0.1 10*3/uL — ABNORMAL HIGH (ref 0.00–0.07)
Basophils Absolute: 0.1 10*3/uL (ref 0.0–0.1)
Basophils Relative: 1 %
Eosinophils Absolute: 0.2 10*3/uL (ref 0.0–0.5)
Eosinophils Relative: 2 %
HCT: 28.9 % — ABNORMAL LOW (ref 36.0–46.0)
Hemoglobin: 9.5 g/dL — ABNORMAL LOW (ref 12.0–15.0)
Immature Granulocytes: 1 %
Lymphocytes Relative: 19 %
Lymphs Abs: 1.4 10*3/uL (ref 0.7–4.0)
MCH: 31.6 pg (ref 26.0–34.0)
MCHC: 32.9 g/dL (ref 30.0–36.0)
MCV: 96 fL (ref 80.0–100.0)
Monocytes Absolute: 0.8 10*3/uL (ref 0.1–1.0)
Monocytes Relative: 10 %
Neutro Abs: 5.2 10*3/uL (ref 1.7–7.7)
Neutrophils Relative %: 67 %
Platelets: 261 10*3/uL (ref 150–400)
RBC: 3.01 MIL/uL — ABNORMAL LOW (ref 3.87–5.11)
RDW: 12 % (ref 11.5–15.5)
WBC: 7.7 10*3/uL (ref 4.0–10.5)
nRBC: 0 % (ref 0.0–0.2)

## 2023-08-22 LAB — BASIC METABOLIC PANEL WITH GFR
Anion gap: 9 (ref 5–15)
BUN: 16 mg/dL (ref 8–23)
CO2: 25 mmol/L (ref 22–32)
Calcium: 8.5 mg/dL — ABNORMAL LOW (ref 8.9–10.3)
Chloride: 99 mmol/L (ref 98–111)
Creatinine, Ser: 0.93 mg/dL (ref 0.44–1.00)
GFR, Estimated: >60 mL/min (ref >60)
Glucose, Bld: 88 mg/dL (ref 70–99)
Potassium: 3.8 mmol/L (ref 3.5–5.1)
Sodium: 133 mmol/L — ABNORMAL LOW (ref 135–145)

## 2023-08-22 NOTE — Progress Notes (Signed)
 TRH ROUNDING NOTE JAYLINN CICCARELLI ZOX:[id number]  DOB: 1941-01-04  DOA: 08/14/2023  PCP: Estill Hemming, DO  08/22/2023,2:07 PM  LOS: 8 days    Code Status: Full code   from: Home current Dispo:?  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  C. difficile colitis 2023 Metastatic breast cancer with liver biopsy 03/2021 and subsequent hematoma which self resolved-bony mets to chest with complications of left-sided pleural effusions previously Paroxysmal A-fib CHA2DS2-VASc >4-not a candidate previously for anticoagulation 2/2 hematoma  4/18 ground-level mechanical fall without LOC in the setting of being startled by an insect-left intertrochanteric fracture noted with subtrochanteric femur fracture additionally orthopedics Dr. Hiram Lukes consulted 4/18 cephalomedullary nailing left intertrochanteric femoral fracture--- WBAT   Plan  Left hip fracture-repair as above 4/18 Discontinued tramadol  completely 4/23, pain much better controlled on scheduled Tylenol  650 every 6---Oxy IR 10 every 6 hours scheduled -Diladid 1 mg po every 4 as needed for severe 7-10 pain, for breakthrough IV Dilaudid  0.5 every 4 as needed  Robaxin  500 every 6 as needed for spasm She is ambulatory to some degree and improving slowly  Paroxysmal atrial fibrillation CHA2DS2-VASc 4, has bled score 3 -not a candidate for anticoagulation secondary to previous intra-abdominal hematoma from liver biopsy Anticoagulation to be revisited as outpatient by cardiology  Diarrhea subacute/chronic in setting of underlying Crohn's Stop Reglan  MiraLAX  17 g daily, Colace 100 twice daily as at baseline has diarrhea Resumed home Lomotil  1-2 , 3-4 /d for diarrhea--- she has had no further diarrhea--she is actually little bit constipated-I  have asked her to let me know if she remains that way and I will stop the Lomotil   Underlying Crohn's disease with prior colonoscopy prior surgeries and SBO--sees [place] [facility] Dr. 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   Metastatic breast cancer with liver radiation 03/2021-intra-abdominal hematoma-is on fulvestrant  injections every 4 weekly Last imaging 01/23/2023 stable hepatic osseous metastatic disease--PET scan 04/2023 showed hypermetabolic disease  Anxiety/depression Continue Prozac  20, Ativan  0.5 every 6 as needed anxiety-she can have Ambien  10 at night Overall she seems much better and is less anxious   DVT prophylaxis: Prophylactic Lovenox   Status is: Inpatient Remains inpatient appropriate because:   Awaiting insurance authorizations for rehab  Subjective:  Pain overall controlled No fever Ambulating with walker in the room from bathroom no chest pain no fever no chills ROM seems intact no focal deficit   Objective + exam Vitals:   08/21/23 1435 08/21/23 2007 08/22/23 0347 08/22/23 0807  BP: (P) 117/63 116/67 (!) 112/57 116/66  Pulse: (P) 74 71 73 73  Resp: (P) 18 16 16 16   Temp: (P) 98.1 F (36.7 C) 98.9 F (37.2 C) (!) 97.5 F (36.4 C) 98.3 F (36.8 C)  TempSrc: (P) Oral  Oral Oral  SpO2: (P) 98% 95% 97% 98%  Weight:      Height:       Filed Weights   08/15/23 0524 08/16/23 0500 08/18/23 0500  Weight: 77.2 kg 81.1 kg 80.8 kg    Examination:  EOMI NCAT coherent pleasant no distress S1-S2 no murmur Chest is clear Did not examine hip today  Data Reviewed: reviewed   CBC    Component Value Date/Time   WBC 7.7 08/22/2023 0632   RBC 3.01 (L) 08/22/2023 0632   HGB 9.5 (L) 08/22/2023 8119  HGB 14.0 07/16/2023 1119   HGB 13.7 12/06/2014 0804   HCT 28.9 (L) 08/22/2023 0632   HCT 41.1 12/06/2014 0804   PLT 261 08/22/2023 0632   PLT 209 07/16/2023 1119   PLT 303 12/06/2014 0804   MCV 96.0 08/22/2023 0632    MCV 95.8 12/06/2014 0804   MCH 31.6 08/22/2023 0632   MCHC 32.9 08/22/2023 0632   RDW 12.0 08/22/2023 0632   RDW 12.2 12/06/2014 0804   LYMPHSABS 1.4 08/22/2023 0632   LYMPHSABS 1.8 12/06/2014 0804   MONOABS 0.8 08/22/2023 0632   MONOABS 0.8 12/06/2014 0804   EOSABS 0.2 08/22/2023 0632   EOSABS 0.2 12/06/2014 0804   BASOSABS 0.1 08/22/2023 0632   BASOSABS 0.0 12/06/2014 0804      Latest Ref Rng & Units 08/22/2023    6:32 AM 08/21/2023    4:23 AM 08/20/2023    6:04 AM  CMP  Glucose 70 - 99 mg/dL 88  93  161   BUN 8 - 23 mg/dL 16  18  15    Creatinine 0.44 - 1.00 mg/dL 0.96  0.45  4.09   Sodium 135 - 145 mmol/L 133  133  131   Potassium 3.5 - 5.1 mmol/L 3.8  4.2  3.9   Chloride 98 - 111 mmol/L 99  98  96   CO2 22 - 32 mmol/L 25  24  24    Calcium  8.9 - 10.3 mg/dL 8.5  8.8  8.5     Scheduled Meds:  acetaminophen   650 mg Oral Q6H   Or   acetaminophen   650 mg Rectal Q6H   cholecalciferol   1,000 Units Oral Daily   enoxaparin  (LOVENOX ) injection  40 mg Subcutaneous Q24H   FLUoxetine   20 mg Oral Daily   oxyCODONE   10 mg Oral Q4H   predniSONE   5 mg Oral Daily   sodium chloride  flush  3-10 mL Intravenous Q12H   Continuous Infusions:  Time 25  Jai-Gurmukh Kiree Dejarnette, MD  [facility]

## 2023-08-22 NOTE — Plan of Care (Signed)
  Problem: Education: Goal: Knowledge of General Education information will improve Description: Including pain rating scale, medication(s)/side effects and non-pharmacologic comfort measures Outcome: Progressing   Problem: Health Behavior/Discharge Planning: Goal: Ability to manage health-related needs will improve Outcome: Progressing   Problem: Activity: Goal: Risk for activity intolerance will decrease Outcome: Progressing   Problem: Nutrition: Goal: Adequate nutrition will be maintained Outcome: Progressing   Problem: Elimination: Goal: Will not experience complications related to urinary retention Outcome: Progressing   Problem: Pain Managment: Goal: General experience of comfort will improve and/or be controlled Outcome: Progressing

## 2023-08-22 NOTE — TOC Progression Note (Signed)
 Transition of Care [facility]) - Progression Note    Patient Details  Name: Sara Barnes MRN: [id number] Date of Birth: February 08, 1941  Transition of Care [facility]) CM/SW Contact  Adline Hook, [place] Phone Number: 08/22/2023, 3:27 PM  Clinical Narrative:     CSW called patient to inquire about bed preference. Patient requested CSW call her sister [name]. CSW called [name] and discussed bed offers. [name] decided on [facility]. LCM will will follow up with [facility] and start insurance auth.   Expected Discharge Plan: IP Rehab Facility Barriers to Discharge: Continued Medical Work up, SNF Pending bed offer, Other (must enter comment) (CIR auth denial under appeal)  Expected Discharge Plan and Services In-house Referral: Clinical Social Work   Post Acute Care Choice: IP Rehab Living arrangements for the past 2 months: Single Family Home                                       Social Determinants of Health (SDOH) Interventions SDOH Screenings   Food Insecurity: No Food Insecurity (08/14/2023)  Housing: Low Risk  (08/14/2023)  Transportation Needs: No Transportation Needs (08/14/2023)  Utilities: Not At Risk (08/14/2023)  Alcohol Screen: Low Risk  (03/26/2021)  Depression (PHQ2-9): Low Risk  (03/28/2022)  Financial Resource Strain: Low Risk  (03/26/2021)  Physical Activity: Sufficiently Active (03/26/2021)  Social Connections: Moderately Isolated (08/14/2023)  Stress: No Stress Concern Present (03/26/2021)  Tobacco Use: Low Risk  (08/14/2023)    Readmission Risk Interventions     No data to display

## 2023-08-23 DIAGNOSIS — S72002A Fracture of unspecified part of neck of left femur, initial encounter for closed fracture: Secondary | ICD-10-CM | POA: Diagnosis not present

## 2023-08-23 LAB — BASIC METABOLIC PANEL WITH GFR
Anion gap: 9 (ref 5–15)
BUN: 15 mg/dL (ref 8–23)
CO2: 24 mmol/L (ref 22–32)
Calcium: 8.5 mg/dL — ABNORMAL LOW (ref 8.9–10.3)
Chloride: 97 mmol/L — ABNORMAL LOW (ref 98–111)
Creatinine, Ser: 0.87 mg/dL (ref 0.44–1.00)
GFR, Estimated: >60 mL/min (ref >60)
Glucose, Bld: 92 mg/dL (ref 70–99)
Potassium: 3.8 mmol/L (ref 3.5–5.1)
Sodium: 130 mmol/L — ABNORMAL LOW (ref 135–145)

## 2023-08-23 LAB — CBC WITH DIFFERENTIAL/PLATELET
Abs Immature Granulocytes: 0.1 10*3/uL — ABNORMAL HIGH (ref 0.00–0.07)
Basophils Absolute: 0.1 10*3/uL (ref 0.0–0.1)
Basophils Relative: 1 %
Eosinophils Absolute: 0.1 10*3/uL (ref 0.0–0.5)
Eosinophils Relative: 2 %
HCT: 28.7 % — ABNORMAL LOW (ref 36.0–46.0)
Hemoglobin: 9.5 g/dL — ABNORMAL LOW (ref 12.0–15.0)
Immature Granulocytes: 1 %
Lymphocytes Relative: 20 %
Lymphs Abs: 1.5 10*3/uL (ref 0.7–4.0)
MCH: 31.7 pg (ref 26.0–34.0)
MCHC: 33.1 g/dL (ref 30.0–36.0)
MCV: 95.7 fL (ref 80.0–100.0)
Monocytes Absolute: 0.7 10*3/uL (ref 0.1–1.0)
Monocytes Relative: 9 %
Neutro Abs: 5.1 10*3/uL (ref 1.7–7.7)
Neutrophils Relative %: 67 %
Platelets: 280 10*3/uL (ref 150–400)
RBC: 3 MIL/uL — ABNORMAL LOW (ref 3.87–5.11)
RDW: 12.1 % (ref 11.5–15.5)
WBC: 7.6 10*3/uL (ref 4.0–10.5)
nRBC: 0 % (ref 0.0–0.2)

## 2023-08-23 MED ORDER — FLUTICASONE PROPIONATE 50 MCG/ACT NA SUSP
2.0000 | Freq: Every day | NASAL | Status: DC
Start: 2023-08-23 — End: 2023-08-25
  Administered 2023-08-23 – 2023-08-25 (×3): 2 via NASAL
  Filled 2023-08-23: qty 16

## 2023-08-23 MED ORDER — ALPRAZOLAM 0.25 MG PO TABS
0.2500 mg | ORAL_TABLET | Freq: Two times a day (BID) | ORAL | Status: DC | PRN
  Administered 2023-08-24: 0.25 mg via ORAL
  Filled 2023-08-23: qty 1

## 2023-08-23 MED ORDER — FLUTICASONE PROPIONATE 50 MCG/ACT NA SUSP
1.0000 | Freq: Every day | NASAL | Status: DC
Start: 2023-08-23 — End: 2023-08-23

## 2023-08-23 NOTE — Plan of Care (Signed)
 Patient has ambulated in halls and up to chair for most of day.  Vitals remain stable and waiting for bed approval at rehab.

## 2023-08-23 NOTE — Plan of Care (Signed)

## 2023-08-23 NOTE — Progress Notes (Signed)
 Mobility Specialist Progress Note:    08/23/23 1042  Mobility  Activity Ambulated with assistance in hallway  Level of Assistance Contact guard assist, steadying assist  Assistive Device Front wheel walker  Distance Ambulated (ft) 50 ft  LLE Weight Bearing Per Provider Order WBAT  Activity Response Tolerated well  Mobility Referral Yes  Mobility visit 1 Mobility  Mobility Specialist Start Time (ACUTE ONLY) 1027  Mobility Specialist Stop Time (ACUTE ONLY) 1039  Mobility Specialist Time Calculation (min) (ACUTE ONLY) 12 min   Pt received in BR and agreeable. C/o fatigue from lack of sleep last night. Otherwise asymptomatic w/ no complaints during ambulation. Pt left in chair with call bell and all needs met. Family present.  D'Vante Nolon Baxter Mobility Specialist Please contact via Special educational needs teacher or Rehab office at [phone]

## 2023-08-23 NOTE — TOC Progression Note (Signed)
 Transition of Care [facility]) - Progression Note    Patient Details  Name: Sara Barnes MRN: [id number] Date of Birth: 20-Sep-1940  Transition of Care [facility]) CM/SW Contact  Ernst Heap Phone Number: [phone] 08/23/2023, 8:50 AM  Clinical Narrative:   CSW started auth for patient to go to [facility]. Insurance auth pending.  Insurance auth id #[id number].   TOC will continue following.   Expected Discharge Plan: IP Rehab Facility Barriers to Discharge: Continued Medical Work up, SNF Pending bed offer, Other (must enter comment) (CIR auth denial under appeal)  Expected Discharge Plan and Services In-house Referral: Clinical Social Work   Post Acute Care Choice: IP Rehab Living arrangements for the past 2 months: Single Family Home                                       Social Determinants of Health (SDOH) Interventions SDOH Screenings   Food Insecurity: No Food Insecurity (08/14/2023)  Housing: Low Risk  (08/14/2023)  Transportation Needs: No Transportation Needs (08/14/2023)  Utilities: Not At Risk (08/14/2023)  Alcohol Screen: Low Risk  (03/26/2021)  Depression (PHQ2-9): Low Risk  (03/28/2022)  Financial Resource Strain: Low Risk  (03/26/2021)  Physical Activity: Sufficiently Active (03/26/2021)  Social Connections: Moderately Isolated (08/14/2023)  Stress: No Stress Concern Present (03/26/2021)  Tobacco Use: Low Risk  (08/14/2023)    Readmission Risk Interventions     No data to display

## 2023-08-23 NOTE — Progress Notes (Signed)
 TRH ROUNDING NOTE LAKHIA BREDEMEIER NFA:[id number]  DOB: Aug 08, 1940  DOA: 08/14/2023  PCP: Estill Hemming, DO  08/23/2023,9:29 AM  LOS: 9 days    Code Status: Full code   from: Home current Dispo:?  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  C. difficile colitis 2023 Metastatic breast cancer with liver biopsy 03/2021 and subsequent hematoma which self resolved-bony mets to chest with complications of left-sided pleural effusions previously Paroxysmal A-fib CHA2DS2-VASc >4-not a candidate previously for anticoagulation 2/2 hematoma  4/18 ground-level mechanical fall without LOC in the setting of being startled by an insect-left intertrochanteric fracture noted with subtrochanteric femur fracture additionally orthopedics Dr. Hiram Lukes consulted 4/18 cephalomedullary nailing left intertrochanteric femoral fracture--- WBAT   Plan  Left hip fracture-repair as above 4/18 pain controlled on scheduled Tylenol  650 every 6---Oxy IR 10 every 4 hours scheduled -Diladid 1 mg po every 4 as needed for severe 7-10 pain, for breakthrough IV Dilaudid  0.5 every 4 as needed  Robaxin  500 every 6 as needed for spasm She is ambulatory to some degree and improving slowly--await SNF Auth--stable for d/c since 4/25  Paroxysmal atrial fibrillation CHA2DS2-VASc 4, has bled score 3 -not a candidate for anticoagulation secondary to previous intra-abdominal hematoma from liver biopsy Anticoagulation to be revisited as outpatient by cardiology  Diarrhea subacute/chronic in setting of underlying Crohn's Stop Reglan  MiraLAX  17 g daily, Colace 100 twice daily as at baseline has diarrhea Resumed home Lomotil  1-2 , 3-4 /d for diarrhea--- she has had no further diarrhea--is a little bit constipated--may need adjustment of  meds  Underlying Crohn's disease with prior colonoscopy prior surgeries and SBO--sees [facility] Dr. 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   Metastatic breast cancer with liver radiation 03/2021-intra-abdominal hematoma-is on fulvestrant  injections every 4 weekly Last imaging 01/23/2023 stable hepatic osseous metastatic disease--PET scan 04/2023 showed hypermetabolic disease  Anxiety/depression Continue Prozac  20-she can have Ambien  10 at night Resumed home Xanax  0.25 bid Overall she seems much better and is less anxious   DVT prophylaxis: Prophylactic Lovenox   Status is: Inpatient Remains inpatient appropriate because:   Awaiting insurance authorizations for rehab  Subjective:  Pain manageable-cites concerns being able to get up at night.  So needed purewick No chest pain fever chills ROM seems intact Asking for allergy meds  Objective + exam Vitals:   08/22/23 1929 08/23/23 0419 08/23/23 0454 08/23/23 0823  BP: (!) 115/56 (!) 118/58  (!) 115/56  Pulse: 75 72  70  Resp: 16 17  18   Temp: 98.4 F (36.9 C) 97.7 F (36.5 C)  97.6 F (36.4 C)  TempSrc: Oral Oral    SpO2: 95% 96%  95%  Weight:   79.6 kg   Height:       Filed Weights   08/16/23 0500 08/18/23 0500 08/23/23 0454  Weight: 81.1 kg 80.8 kg 79.6 kg    Examination:  Coherent awake Looks comfortable CTAB no added sound Abdomen soft Wound not examined  Data Reviewed: reviewed   CBC    Component Value Date/Time   WBC 7.6 08/23/2023 0525   RBC 3.00 (L) 08/23/2023 0525   HGB 9.5 (L) 08/23/2023 0525   HGB 14.0 07/16/2023 1119   HGB 13.7 12/06/2014 0804   HCT 28.7 (L) 08/23/2023 0525   HCT 41.1 12/06/2014 0804   PLT 280  08/23/2023 0525   PLT 209 07/16/2023 1119   PLT 303 12/06/2014 0804   MCV 95.7 08/23/2023 0525   MCV 95.8 12/06/2014 0804   MCH 31.7 08/23/2023 0525   MCHC 33.1 08/23/2023 0525   RDW 12.1 08/23/2023 0525   RDW 12.2 12/06/2014 0804    LYMPHSABS 1.5 08/23/2023 0525   LYMPHSABS 1.8 12/06/2014 0804   MONOABS 0.7 08/23/2023 0525   MONOABS 0.8 12/06/2014 0804   EOSABS 0.1 08/23/2023 0525   EOSABS 0.2 12/06/2014 0804   BASOSABS 0.1 08/23/2023 0525   BASOSABS 0.0 12/06/2014 0804      Latest Ref Rng & Units 08/23/2023    5:25 AM 08/22/2023    6:32 AM 08/21/2023    4:23 AM  CMP  Glucose 70 - 99 mg/dL 92  88  93   BUN 8 - 23 mg/dL 15  16  18    Creatinine 0.44 - 1.00 mg/dL 1.61  0.96  0.45   Sodium 135 - 145 mmol/L 130  133  133   Potassium 3.5 - 5.1 mmol/L 3.8  3.8  4.2   Chloride 98 - 111 mmol/L 97  99  98   CO2 22 - 32 mmol/L 24  25  24    Calcium  8.9 - 10.3 mg/dL 8.5  8.5  8.8     Scheduled Meds:  acetaminophen   650 mg Oral Q6H   Or   acetaminophen   650 mg Rectal Q6H   cholecalciferol   1,000 Units Oral Daily   enoxaparin  (LOVENOX ) injection  40 mg Subcutaneous Q24H   FLUoxetine   20 mg Oral Daily   fluticasone   1 spray Each Nare Daily   oxyCODONE   10 mg Oral Q4H   predniSONE   5 mg Oral Daily   sodium chloride  flush  3-10 mL Intravenous Q12H   Continuous Infusions:  Time 25  Jai-Gurmukh Taelyr Jantz, MD  [facility]

## 2023-08-24 DIAGNOSIS — S72002A Fracture of unspecified part of neck of left femur, initial encounter for closed fracture: Secondary | ICD-10-CM | POA: Diagnosis not present

## 2023-08-24 NOTE — Plan of Care (Signed)

## 2023-08-24 NOTE — Progress Notes (Signed)
 TRH ROUNDING NOTE Sara Barnes WJX:[id number]  DOB: February 18, 1941  DOA: 08/14/2023  PCP: Estill Hemming, DO  08/24/2023,2:22 PM  LOS: 10 days    Code Status: Full code   from: Home current Dispo:?  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  C. difficile colitis 2023 Metastatic breast cancer with liver biopsy 03/2021 and subsequent hematoma which self resolved-bony mets to chest with complications of left-sided pleural effusions previously Paroxysmal A-fib CHA2DS2-VASc >4-not a candidate previously for anticoagulation 2/2 hematoma  4/18 ground-level mechanical fall without LOC in the setting of being startled by an insect-left intertrochanteric fracture noted with subtrochanteric femur fracture additionally orthopedics Dr. Hiram Lukes consulted 4/18 cephalomedullary nailing left intertrochanteric femoral fracture--- WBAT   Plan  Left hip fracture-repair as above 4/18 pain controlled on scheduled Tylenol  650 every 6---Oxy IR 10 every 4 hours scheduled -Diladid 1 mg po every 4 as needed for severe 7-10 pain, for breakthrough IV Dilaudid  0.5 every 4 as needed  Robaxin  500 every 6 as needed for spasm-overall no changes currently She is ambulatory to some degree and improving slowly--await SNF Auth--stable for d/c since 4/25  Paroxysmal atrial fibrillation CHA2DS2-VASc 4, has bled score 3 -not a candidate for anticoagulation secondary to previous intra-abdominal hematoma from liver biopsy Anticoagulation to be revisited as outpatient by cardiology  Diarrhea subacute/chronic in setting of underlying Crohn's Stop Reglan  MiraLAX  17 g daily, Colace 100 twice daily as at baseline has diarrhea Resumed home Lomotil  1-2 , 3-4 /d for diarrhea--- she has had no further diarrhea  Underlying Crohn's  disease with prior colonoscopy prior surgeries and SBO--sees [facility] Dr. Bridgett Camps Having 1 stool daily no diarrhea Maintained on prednisone  5 daily--not a candidate for CKD 4/6 inhibitor   Metastatic breast cancer with liver radiation 03/2021-intra-abdominal hematoma-is on fulvestrant  injections every 4 weekly Last imaging 01/23/2023 stable hepatic osseous metastatic disease--PET scan 04/2023 showed hypermetabolic disease  Anxiety/depression Continue Prozac  20-she can have Ambien  10 at night Resumed home Xanax  0.25 bid Overall she seems much better and is less anxious   DVT prophylaxis: Prophylactic Lovenox   Status is: Inpatient Remains inpatient appropriate because:   Awaiting insurance authorizations for rehab  Subjective:  Overall no changes-using nighttime PureWick  Objective + exam Vitals:   08/23/23 1456 08/23/23 2025 08/24/23 0457 08/24/23 0814  BP: 113/61 121/73 122/66 114/69  Pulse: 72 77 67 71  Resp: 18 16 16 20   Temp: 98.2 F (36.8 C) 98.6 F (37 C) 98.3 F (36.8 C) 98.4 F (36.9 C)  TempSrc: Oral   Oral  SpO2: 95% 97% 99% 97%  Weight:      Height:       Filed Weights   08/16/23 0500 08/18/23 0500 08/23/23 0454  Weight: 81.1 kg 80.8 kg 79.6 kg    Examination:  Coherent awake s1 S2 no murmur Chest clear  abdominal soft no rebound  Data Reviewed: reviewed   CBC    Component Value Date/Time   WBC 7.6 08/23/2023 0525   RBC 3.00 (L) 08/23/2023 0525   HGB 9.5 (L) 08/23/2023 0525   HGB 14.0 07/16/2023 1119   HGB 13.7 12/06/2014 0804   HCT 28.7 (L) 08/23/2023 0525   HCT 41.1 12/06/2014 0804   PLT 280 08/23/2023 0525   PLT 209 07/16/2023 1119   PLT 303 12/06/2014 0804   MCV 95.7 08/23/2023 0525  MCV 95.8 12/06/2014 0804   MCH 31.7 08/23/2023 0525   MCHC 33.1 08/23/2023 0525   RDW 12.1 08/23/2023 0525   RDW 12.2 12/06/2014 0804   LYMPHSABS 1.5 08/23/2023 0525   LYMPHSABS 1.8 12/06/2014 0804   MONOABS 0.7 08/23/2023 0525   MONOABS 0.8  12/06/2014 0804   EOSABS 0.1 08/23/2023 0525   EOSABS 0.2 12/06/2014 0804   BASOSABS 0.1 08/23/2023 0525   BASOSABS 0.0 12/06/2014 0804      Latest Ref Rng & Units 08/23/2023    5:25 AM 08/22/2023    6:32 AM 08/21/2023    4:23 AM  CMP  Glucose 70 - 99 mg/dL 92  88  93   BUN 8 - 23 mg/dL 15  16  18    Creatinine 0.44 - 1.00 mg/dL 4.09  8.11  9.14   Sodium 135 - 145 mmol/L 130  133  133   Potassium 3.5 - 5.1 mmol/L 3.8  3.8  4.2   Chloride 98 - 111 mmol/L 97  99  98   CO2 22 - 32 mmol/L 24  25  24    Calcium  8.9 - 10.3 mg/dL 8.5  8.5  8.8     Scheduled Meds:  acetaminophen   650 mg Oral Q6H   Or   acetaminophen   650 mg Rectal Q6H   cholecalciferol   1,000 Units Oral Daily   enoxaparin  (LOVENOX ) injection  40 mg Subcutaneous Q24H   FLUoxetine   20 mg Oral Daily   fluticasone   2 spray Each Nare Daily   oxyCODONE   10 mg Oral Q4H   predniSONE   5 mg Oral Daily   sodium chloride  flush  3-10 mL Intravenous Q12H   Continuous Infusions:  Time 15  Verlie Glisson, MD  [facility]

## 2023-08-24 NOTE — Plan of Care (Signed)
  Problem: Education: Goal: Knowledge of General Education information will improve Description: Including pain rating scale, medication(s)/side effects and non-pharmacologic comfort measures Outcome: Progressing   Problem: Activity: Goal: Risk for activity intolerance will decrease Outcome: Progressing   Problem: Nutrition: Goal: Adequate nutrition will be maintained Outcome: Progressing   Problem: Elimination: Goal: Will not experience complications related to bowel motility Outcome: Progressing   Problem: Elimination: Goal: Will not experience complications related to urinary retention Outcome: Progressing   Problem: Pain Managment: Goal: General experience of comfort will improve and/or be controlled Outcome: Progressing

## 2023-08-24 NOTE — Progress Notes (Addendum)
 Physical Therapy Treatment Patient Details Name: SHANNIE BERROA MRN: [id number] DOB: 1940-08-27 Today's Date: 08/24/2023   History of Present Illness 83 y.o. female presents to [facility] hospital on 08/14/2023 after fall and subsequent L hip fx. Pt underwent IM nailing on 4/18. PMH includes afib, breast CA, crohn's disease, HLD.    PT Comments  Pt reporting anxiety upon PT arrival to room given need to have BM. PT assisted pt to [facility] and then to toilet for pt preference. Pt overall requiring close guard to light assist for bed mobility, transfers, and short-distance hallway gait. Pt with increasingly antalgic gait with further distance, and endorsing min nausea suspect due to pain medications. Pt progressing well, plan remains appropriate for post-acute rehab.      If plan is discharge home, recommend the following: A little help with walking and/or transfers;A lot of help with bathing/dressing/bathroom;Assistance with cooking/housework;Assist for transportation;Help with stairs or ramp for entrance   Can travel by private vehicle     Yes  Equipment Recommendations  BSC/3in1;Wheelchair (measurements PT)    Recommendations for Other Services Rehab consult     Precautions / Restrictions Precautions Precautions: Fall Restrictions Weight Bearing Restrictions Per Provider Order: Yes LLE Weight Bearing Per Provider Order: Weight bearing as tolerated     Mobility  Bed Mobility Overal bed mobility: Needs Assistance Bed Mobility: Supine to Sit     Supine to sit: Min assist, HOB elevated, Used rails     General bed mobility comments: assist for trunk elevation, LLE progression to EOB.    Transfers Overall transfer level: Needs assistance Equipment used: Rolling walker (2 wheels) Transfers: Sit to/from Stand Sit to Stand: Contact guard assist, Min assist           General transfer comment: close guard for safety when rising from elevated surface, min boost when rising from low seat.  Stand x3, from EOB, BSC, and toilet.    Ambulation/Gait Ambulation/Gait assistance: Contact guard assist Gait Distance (Feet): 80 Feet Assistive device: Rolling walker (2 wheels) Gait Pattern/deviations: Step-through pattern, Decreased stride length, Trunk flexed, Antalgic Gait velocity: decr     General Gait Details: close guard for safety, cues for proximity to RW. increasingly antalgic gait and nausea with mobility.   Stairs             Wheelchair Mobility     Tilt Bed    Modified Rankin (Stroke Patients Only)       Balance Overall balance assessment: Needs assistance Sitting-balance support: No upper extremity supported, Feet supported Sitting balance-Leahy Scale: Good     Standing balance support: Bilateral upper extremity supported, During functional activity, Reliant on assistive device for balance Standing balance-Leahy Scale: Poor Standing balance comment: Reliant on RW                            Communication Communication Communication: Impaired Factors Affecting Communication: Hearing impaired  Cognition Arousal: Alert Behavior During Therapy: Anxious   PT - Cognitive impairments: Safety/Judgement, Sequencing                       PT - Cognition Comments: sequencing and safety cues throughout mobility Following commands: Intact      Cueing Cueing Techniques: Verbal cues  Exercises General Exercises - Lower Extremity Long Arc Quad: AROM, Left, 5 reps, Seated    General Comments        Pertinent Vitals/Pain Pain Assessment Pain Assessment: Faces Faces  Pain Scale: Hurts little more Pain Location: L hip Pain Descriptors / Indicators: Sore, Guarding, Grimacing Pain Intervention(s): Monitored during session, Limited activity within patient's tolerance, Repositioned    Home Living                          Prior Function            PT Goals (current goals can now be found in the care plan section)  Acute Rehab PT Goals Patient Stated Goal: to regain independence PT Goal Formulation: With patient Time For Goal Achievement: 08/29/23 Potential to Achieve Goals: Good Progress towards PT goals: Progressing toward goals    Frequency    Min 3X/week      PT Plan      Co-evaluation              AM-PAC PT "6 Clicks" Mobility   Outcome Measure  Help needed turning from your back to your side while in a flat bed without using bedrails?: A Little Help needed moving from lying on your back to sitting on the side of a flat bed without using bedrails?: A Little Help needed moving to and from a bed to a chair (including a wheelchair)?: A Little Help needed standing up from a chair using your arms (e.g., wheelchair or bedside chair)?: A Little Help needed to walk in hospital room?: A Little Help needed climbing 3-5 steps with a railing? : A Lot 6 Click Score: 17    End of Session Equipment Utilized During Treatment: Gait belt Activity Tolerance: Patient tolerated treatment well Patient left: in chair;with call bell/phone within reach;with chair alarm set Nurse Communication: Mobility status PT Visit Diagnosis: Other abnormalities of gait and mobility (R26.89);Muscle weakness (generalized) (M62.81);History of falling (Z91.81)     Time: 5784-6962; 925-946 (PT left room while pt on toilet)   PT Time Calculation (min) (ACUTE ONLY): 34 min  Charges:    $Gait Training: 8-22 mins $Therapeutic Activity: 8-22 mins PT General Charges $$ ACUTE PT VISIT: 1 Visit                     Shirlene Doughty, PT DPT Acute Rehabilitation Services Secure Chat Preferred  Office [phone]    Kevona Lupinacci E Burnadette Carrion 08/24/2023, 10:24 AM

## 2023-08-24 NOTE — Progress Notes (Signed)
 OT Cancellation Note  Patient Details Name: Sara Barnes MRN: [id number] DOB: February 11, 1941   Cancelled Treatment:    Reason Eval/Treat Not Completed: Patient declined, no reason specified;Pain limiting ability to participate (Pt reports being fatigued secondary to multiple trips to restroom today and ambulation with PT earlier today. Kindly requests to rest.)  Javani Spratt D Walton, OTD, OTR/L [facility] Acute Rehabilitation Office: [phone]   Emery Hans 08/24/2023, 4:06 PM

## 2023-08-24 NOTE — TOC Progression Note (Addendum)
 Transition of Care [facility]) - Progression Note    Patient Details  Name: Sara Barnes MRN: [id number] Date of Birth: 1940/05/17  [facility]) CM/SW Contact  [name], LCSW Phone Number: 08/24/2023, 8:22 AM  Clinical Narrative:   SNF auth remains pending in [place].    1030: new PT note uploaded to [employer].   1300: SNF auth approved: [id number], 3 days: 4/28-4/30.  1410: [facility] does not have bed available today.    Expected Discharge Plan: IP Rehab Facility Barriers to Discharge: Continued Medical Work up, SNF Pending bed offer, Other (must enter comment) (CIR auth denial under appeal)  Expected Discharge Plan and Services In-house Referral: Clinical Social Work   Post Acute Care Choice: IP Rehab Living arrangements for the past 2 months: Single Family Home                                       Social Determinants of Health (SDOH) Interventions SDOH Screenings   Food Insecurity: No Food Insecurity (08/14/2023)  Housing: Low Risk  (08/14/2023)  Transportation Needs: No Transportation Needs (08/14/2023)  Utilities: Not At Risk (08/14/2023)  Alcohol Screen: Low Risk  (03/26/2021)  Depression (PHQ2-9): Low Risk  (03/28/2022)  Financial Resource Strain: Low Risk  (03/26/2021)  Physical Activity: Sufficiently Active (03/26/2021)  Social Connections: Moderately Isolated (08/14/2023)  Stress: No Stress Concern Present (03/26/2021)  Tobacco Use: Low Risk  (08/14/2023)    Readmission Risk Interventions     No data to display

## 2023-08-25 DIAGNOSIS — R531 Weakness: Secondary | ICD-10-CM | POA: Diagnosis not present

## 2023-08-25 DIAGNOSIS — Z66 Do not resuscitate: Secondary | ICD-10-CM | POA: Diagnosis not present

## 2023-08-25 DIAGNOSIS — J168 Pneumonia due to other specified infectious organisms: Secondary | ICD-10-CM | POA: Diagnosis not present

## 2023-08-25 DIAGNOSIS — E222 Syndrome of inappropriate secretion of antidiuretic hormone: Secondary | ICD-10-CM | POA: Diagnosis present

## 2023-08-25 DIAGNOSIS — C50912 Malignant neoplasm of unspecified site of left female breast: Secondary | ICD-10-CM | POA: Diagnosis not present

## 2023-08-25 DIAGNOSIS — F331 Major depressive disorder, recurrent, moderate: Secondary | ICD-10-CM | POA: Diagnosis not present

## 2023-08-25 DIAGNOSIS — Z7189 Other specified counseling: Secondary | ICD-10-CM | POA: Diagnosis not present

## 2023-08-25 DIAGNOSIS — Z79899 Other long term (current) drug therapy: Secondary | ICD-10-CM | POA: Diagnosis not present

## 2023-08-25 DIAGNOSIS — F0631 Mood disorder due to known physiological condition with depressive features: Secondary | ICD-10-CM | POA: Diagnosis not present

## 2023-08-25 DIAGNOSIS — I480 Paroxysmal atrial fibrillation: Secondary | ICD-10-CM | POA: Diagnosis present

## 2023-08-25 DIAGNOSIS — E785 Hyperlipidemia, unspecified: Secondary | ICD-10-CM | POA: Diagnosis present

## 2023-08-25 DIAGNOSIS — A4189 Other specified sepsis: Secondary | ICD-10-CM | POA: Diagnosis not present

## 2023-08-25 DIAGNOSIS — R457 State of emotional shock and stress, unspecified: Secondary | ICD-10-CM | POA: Diagnosis not present

## 2023-08-25 DIAGNOSIS — J9601 Acute respiratory failure with hypoxia: Secondary | ICD-10-CM | POA: Diagnosis not present

## 2023-08-25 DIAGNOSIS — Z9181 History of falling: Secondary | ICD-10-CM | POA: Diagnosis not present

## 2023-08-25 DIAGNOSIS — S72002A Fracture of unspecified part of neck of left femur, initial encounter for closed fracture: Secondary | ICD-10-CM | POA: Diagnosis not present

## 2023-08-25 DIAGNOSIS — F5101 Primary insomnia: Secondary | ICD-10-CM | POA: Diagnosis not present

## 2023-08-25 DIAGNOSIS — J910 Malignant pleural effusion: Secondary | ICD-10-CM | POA: Diagnosis present

## 2023-08-25 DIAGNOSIS — I129 Hypertensive chronic kidney disease with stage 1 through stage 4 chronic kidney disease, or unspecified chronic kidney disease: Secondary | ICD-10-CM | POA: Diagnosis present

## 2023-08-25 DIAGNOSIS — C50312 Malignant neoplasm of lower-inner quadrant of left female breast: Secondary | ICD-10-CM | POA: Diagnosis present

## 2023-08-25 DIAGNOSIS — J189 Pneumonia, unspecified organism: Secondary | ICD-10-CM | POA: Diagnosis not present

## 2023-08-25 DIAGNOSIS — K50119 Crohn's disease of large intestine with unspecified complications: Secondary | ICD-10-CM | POA: Diagnosis not present

## 2023-08-25 DIAGNOSIS — W19XXXD Unspecified fall, subsequent encounter: Secondary | ICD-10-CM | POA: Diagnosis present

## 2023-08-25 DIAGNOSIS — R160 Hepatomegaly, not elsewhere classified: Secondary | ICD-10-CM | POA: Diagnosis not present

## 2023-08-25 DIAGNOSIS — R2689 Other abnormalities of gait and mobility: Secondary | ICD-10-CM | POA: Diagnosis not present

## 2023-08-25 DIAGNOSIS — Z515 Encounter for palliative care: Secondary | ICD-10-CM | POA: Diagnosis not present

## 2023-08-25 DIAGNOSIS — R0603 Acute respiratory distress: Secondary | ICD-10-CM | POA: Diagnosis not present

## 2023-08-25 DIAGNOSIS — E876 Hypokalemia: Secondary | ICD-10-CM | POA: Diagnosis present

## 2023-08-25 DIAGNOSIS — J324 Chronic pansinusitis: Secondary | ICD-10-CM | POA: Diagnosis not present

## 2023-08-25 DIAGNOSIS — C50919 Malignant neoplasm of unspecified site of unspecified female breast: Secondary | ICD-10-CM | POA: Diagnosis not present

## 2023-08-25 DIAGNOSIS — R4589 Other symptoms and signs involving emotional state: Secondary | ICD-10-CM | POA: Diagnosis not present

## 2023-08-25 DIAGNOSIS — J123 Human metapneumovirus pneumonia: Secondary | ICD-10-CM | POA: Diagnosis not present

## 2023-08-25 DIAGNOSIS — F324 Major depressive disorder, single episode, in partial remission: Secondary | ICD-10-CM | POA: Diagnosis not present

## 2023-08-25 DIAGNOSIS — R6520 Severe sepsis without septic shock: Secondary | ICD-10-CM | POA: Diagnosis present

## 2023-08-25 DIAGNOSIS — E8770 Fluid overload, unspecified: Secondary | ICD-10-CM | POA: Diagnosis not present

## 2023-08-25 DIAGNOSIS — R059 Cough, unspecified: Secondary | ICD-10-CM | POA: Diagnosis not present

## 2023-08-25 DIAGNOSIS — F411 Generalized anxiety disorder: Secondary | ICD-10-CM | POA: Diagnosis not present

## 2023-08-25 DIAGNOSIS — C787 Secondary malignant neoplasm of liver and intrahepatic bile duct: Secondary | ICD-10-CM | POA: Diagnosis not present

## 2023-08-25 DIAGNOSIS — Z8781 Personal history of (healed) traumatic fracture: Secondary | ICD-10-CM | POA: Diagnosis not present

## 2023-08-25 DIAGNOSIS — E871 Hypo-osmolality and hyponatremia: Secondary | ICD-10-CM | POA: Diagnosis not present

## 2023-08-25 DIAGNOSIS — J90 Pleural effusion, not elsewhere classified: Secondary | ICD-10-CM | POA: Diagnosis not present

## 2023-08-25 DIAGNOSIS — Z8719 Personal history of other diseases of the digestive system: Secondary | ICD-10-CM | POA: Diagnosis not present

## 2023-08-25 DIAGNOSIS — B9781 Human metapneumovirus as the cause of diseases classified elsewhere: Secondary | ICD-10-CM | POA: Diagnosis present

## 2023-08-25 DIAGNOSIS — R52 Pain, unspecified: Secondary | ICD-10-CM | POA: Diagnosis not present

## 2023-08-25 DIAGNOSIS — R198 Other specified symptoms and signs involving the digestive system and abdomen: Secondary | ICD-10-CM | POA: Diagnosis not present

## 2023-08-25 DIAGNOSIS — F32A Depression, unspecified: Secondary | ICD-10-CM | POA: Diagnosis present

## 2023-08-25 DIAGNOSIS — C7931 Secondary malignant neoplasm of brain: Secondary | ICD-10-CM | POA: Diagnosis not present

## 2023-08-25 DIAGNOSIS — F5104 Psychophysiologic insomnia: Secondary | ICD-10-CM | POA: Diagnosis not present

## 2023-08-25 DIAGNOSIS — C7951 Secondary malignant neoplasm of bone: Secondary | ICD-10-CM | POA: Diagnosis present

## 2023-08-25 DIAGNOSIS — C792 Secondary malignant neoplasm of skin: Secondary | ICD-10-CM | POA: Diagnosis not present

## 2023-08-25 DIAGNOSIS — K5090 Crohn's disease, unspecified, without complications: Secondary | ICD-10-CM | POA: Diagnosis present

## 2023-08-25 DIAGNOSIS — M6281 Muscle weakness (generalized): Secondary | ICD-10-CM | POA: Diagnosis not present

## 2023-08-25 DIAGNOSIS — R339 Retention of urine, unspecified: Secondary | ICD-10-CM | POA: Diagnosis not present

## 2023-08-25 DIAGNOSIS — N182 Chronic kidney disease, stage 2 (mild): Secondary | ICD-10-CM | POA: Diagnosis present

## 2023-08-25 DIAGNOSIS — Z743 Need for continuous supervision: Secondary | ICD-10-CM | POA: Diagnosis not present

## 2023-08-25 DIAGNOSIS — Z1152 Encounter for screening for COVID-19: Secondary | ICD-10-CM | POA: Diagnosis not present

## 2023-08-25 DIAGNOSIS — E872 Acidosis, unspecified: Secondary | ICD-10-CM | POA: Diagnosis present

## 2023-08-25 DIAGNOSIS — S72142D Displaced intertrochanteric fracture of left femur, subsequent encounter for closed fracture with routine healing: Secondary | ICD-10-CM | POA: Diagnosis not present

## 2023-08-25 DIAGNOSIS — T7840XA Allergy, unspecified, initial encounter: Secondary | ICD-10-CM | POA: Diagnosis not present

## 2023-08-25 DIAGNOSIS — D649 Anemia, unspecified: Secondary | ICD-10-CM | POA: Diagnosis not present

## 2023-08-25 DIAGNOSIS — A419 Sepsis, unspecified organism: Secondary | ICD-10-CM | POA: Diagnosis present

## 2023-08-25 DIAGNOSIS — F4322 Adjustment disorder with anxiety: Secondary | ICD-10-CM | POA: Diagnosis not present

## 2023-08-25 DIAGNOSIS — R918 Other nonspecific abnormal finding of lung field: Secondary | ICD-10-CM | POA: Diagnosis not present

## 2023-08-25 DIAGNOSIS — G9341 Metabolic encephalopathy: Secondary | ICD-10-CM | POA: Diagnosis not present

## 2023-08-25 DIAGNOSIS — I4891 Unspecified atrial fibrillation: Secondary | ICD-10-CM | POA: Diagnosis not present

## 2023-08-25 DIAGNOSIS — I10 Essential (primary) hypertension: Secondary | ICD-10-CM | POA: Diagnosis not present

## 2023-08-25 MED ORDER — OXYCODONE HCL 10 MG PO TABS: 10.0000 mg | ORAL_TABLET | ORAL | 0 refills | Status: DC

## 2023-08-25 MED ORDER — ALPRAZOLAM 0.25 MG PO TABS: 0.2500 mg | ORAL_TABLET | Freq: Two times a day (BID) | ORAL | 0 refills | Status: DC | PRN

## 2023-08-25 MED ORDER — ZOLPIDEM TARTRATE 10 MG PO TABS: ORAL_TABLET | ORAL | 0 refills | Status: DC

## 2023-08-25 MED ORDER — FLUOXETINE HCL 20 MG PO CAPS: 20.0000 mg | ORAL_CAPSULE | Freq: Every day | ORAL | 0 refills | Status: DC

## 2023-08-25 MED ORDER — METHOCARBAMOL 500 MG PO TABS: 500.0000 mg | ORAL_TABLET | Freq: Four times a day (QID) | ORAL | 0 refills | Status: DC | PRN

## 2023-08-25 NOTE — TOC Transition Note (Signed)
 Transition of Care [facility]) - Discharge Note   Patient Details  Name: Sara Barnes MRN: [id number] Date of Birth: 1940-07-29  Transition of [facility]) CM/SW Contact:  [name], LCSW Phone Number: 08/25/2023, 10:53 AM   Clinical Narrative:   Pt discharging to [facility].  RN call report to [phone].  [facility] called [phone].   1000: CS[facility] confirmed [facility] that [facility] can receive pt today.  CSW spoke with pt regarding transportation.  Pt does want to use ambulance transport, says she cannot go by car, is aware she could be billed for this.     Final next level of care: Skilled Nursing Facility Barriers to Discharge: Barriers Resolved   Patient Goals and CMS Choice Patient states their goals for this hospitalization and ongoing recovery are:: function normally CMS Medicare.gov Compare Post Acute Care list provided to:: Patient Choice offered to / list presented to : Patient      Discharge Placement              Patient chooses bed at: [facility] Patient to be transferred to facility by: ptar Name of family member notified: sister linda in room Patient and family notified of of transfer: 08/25/23  Discharge Plan and Services Additional resources added to the After Visit Summary for   In-house Referral: Clinical Social Work   Post Acute Care Choice: IP Rehab                               Social Drivers of Health (SDOH) Interventions SDOH Screenings   Food Insecurity: No Food Insecurity (08/14/2023)  Housing: Low Risk  (08/14/2023)  Transportation Needs: No Transportation Needs (08/14/2023)  Utilities: Not At Risk (08/14/2023)  Alcohol Screen: Low Risk  (03/26/2021)  Depression (PHQ2-9): Low Risk  (03/28/2022)  Financial Resource Strain: Low Risk  (03/26/2021)  Physical Activity: Sufficiently Active (03/26/2021)  Social Connections: Moderately Isolated (08/14/2023)  Stress: No Stress Concern Present (03/26/2021)  Tobacco Use: Low Risk   (08/14/2023)     Readmission Risk Interventions     No data to display

## 2023-08-25 NOTE — Discharge Planning (Signed)
 Patient alert. IV access removed. Discharge teaching given to Sijani Tomlin LPN at [facility]. Discharge summary placed in discharge packet along with written prescriptions from discharge provider. Patient will be transported via [facility].

## 2023-08-25 NOTE — Discharge Summary (Signed)
 Physician Discharge Summary  Sara Barnes:[id number] DOB: 10-Oct-1940 DOA: 08/14/2023  PCP: Estill Hemming, DO  Admit date: 08/14/2023 Discharge date: 08/25/2023  Time spent: 35 minutes  Recommendations for Outpatient Follow-up:  Requires CBC Chem-12 in 1 week---check vitamin D  level in about 2 months WBAT LLE, Lovenox  for 30 days for DVT prophylaxis-leave incisions open to air okay to get incisions wet Reevaluate need for overall anticoagulation going forward as has paroxysmal A-fib but had a hematoma from liver biopsy previously may require cardiology to weigh back in Outpatient follow-up with oncologist given metastatic breast cancer--- resume fulvestrant -CC Dr. Maryalice Smaller as an outpatient for scheduling outpatient visit Scripts of all controlled substances including pain meds given  Discharge Diagnoses:  MAIN problem for hospitalization    left hip fracture and repair  Please see below for itemized issues addressed in HOpsital- refer to other progress notes for clarity if needed  Discharge Condition: improved  Diet recommendation: heart healthy  Filed Weights   08/18/23 0500 08/23/23 0454 08/25/23 0500  Weight: 80.8 kg 79.6 kg 79.3 kg    History of present illness:  83 year old white female Crohn's disease-previous Humira -previous perforation post colonoscopy with ex lap 03/24/2017--previous diverting ileostomy 2018- C/B SBO + 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  C. difficile colitis 2023 Metastatic breast cancer with liver biopsy 03/2021 and subsequent hematoma which self resolved-bony mets to chest with complications of left-sided pleural effusions previously Paroxysmal A-fib CHA2DS2-VASc >4-not a candidate previously for anticoagulation 2/2 hematoma   4/18 ground-level mechanical fall without LOC in the setting of being startled by an insect-left intertrochanteric fracture noted with subtrochanteric femur fracture  additionally orthopedics Dr. Hiram Lukes consulted 4/18 cephalomedullary nailing left intertrochanteric femoral fracture--- WBAT     Plan   Left hip fracture-repair as above 4/18 pain controlled on scheduled Tylenol  650 every 6---Oxy IR 10 every 4 hours scheduled - eventually was weanable off of IV meds and as needed breakthrough oral Dilaudid  Robaxin  500 every 6 as needed for spasm-overall    Paroxysmal atrial fibrillation CHA2DS2-VASc 4, has bled score 3 -not a candidate for anticoagulation secondary to previous intra-abdominal hematoma from liver biopsy Anticoagulation to be revisited as outpatient by cardiology   Diarrhea subacute/chronic in setting of underlying Crohn's Stop Reglan  MiraLAX  17 g daily, Colace 100 twice daily as at baseline has diarrhea Resumed home Lomotil  1-2 , 3-4 /d for diarrhea--- she has had no further diarrhea   Underlying Crohn's disease with prior colonoscopy prior surgeries and SBO--sees [facility] Dr. Bridgett Camps Having 1 stool daily no diarrhea Maintained on prednisone  5 daily--not a candidate for CKD 4/6 inhibitor  because of her Crohn's   Metastatic breast cancer with liver radiation 03/2021-intra-abdominal hematoma-is on fulvestrant  injections every 4 weekly Last imaging 01/23/2023 stable hepatic osseous metastatic disease--PET scan 04/2023 showed hypermetabolic disease  cc her oncologist at discharge to ensure not lost follow-up as she was getting injections in the outpatient setting   Anxiety/depression Continue Prozac  20-she can have Ambien  10 at night Resumed home Xanax  0.25 bid Overall she seems much better and is less anxious  Discharge Exam: Vitals:   08/25/23 0355 08/25/23 0755  BP: 122/60 126/65  Pulse: 66 68  Resp: 17 18  Temp: (!) 97.5 F (36.4 C) 98 F (36.7 C)  SpO2: 98% 99%    Subj on day of d/c    awake coherent no distress looks fair feels well no fever no nausea no vomiting no chills no rigor Chest is  clear  in chair today ROM was  intact moving okay  pain is better manageable   Discharge Instructions   Discharge Instructions     Diet - low sodium heart healthy   Complete by: As directed    Increase activity slowly   Complete by: As directed       Allergies as of 08/25/2023       Reactions   Nsaids Other (See Comments)   Crohn's disease = NO NSAIDs   Codeine Nausea And Vomiting   Iodinated Contrast Media    Elevated BP,Itchy skin. Face red in color   Mesalamine  Nausea And Vomiting   Omnipaque  [iohexol ] Itching   Itching and flushed/red face   Oxycodone  Itching, Rash   Lactose Intolerance (gi) Diarrhea   Norco [hydrocodone -acetaminophen ] Nausea Only   Other Itching   PERFUMED LOTIONS Cant have MRI due to ear implant    Sulfa Antibiotics Rash        Medication List     STOP taking these medications    loperamide  2 MG capsule Commonly known as: IMODIUM    ondansetron  4 MG disintegrating tablet Commonly known as: ZOFRAN -ODT   prochlorperazine  5 MG tablet Commonly known as: COMPAZINE        TAKE these medications    acetaminophen  500 MG tablet Commonly known as: TYLENOL  You can take 2 tablets every 6 hours as needed for pain.  Use this as your primary pain control.  Use Tramadol  as a last resort.  You can buy this over the counter at any drug store.    DO NOT TAKE MORE THAN 4000 MG OF TYLENOL  PER DAY.  IT CAN HARM YOUR LIVER.   ALPRAZolam  0.25 MG tablet Commonly known as: XANAX  Take 1 tablet (0.25 mg total) by mouth 2 (two) times daily as needed for anxiety.   cyanocobalamin  1000 MCG/ML injection Commonly known as: VITAMIN B12 INJECT 1ML INTO THE MUSCLE ONCE PER MONTH   diphenoxylate -atropine  2.5-0.025 MG tablet Commonly known as: LOMOTIL  TAKE 1 TO 2 TABLETS BY MOUTH THREE TO FOUR TIMES DAILY AS NEEDED FOR SEVERE DIARRHEA   enoxaparin  40 MG/0.4ML injection Commonly known as: LOVENOX  Inject 0.4 mLs (40 mg total) into the skin daily.   famotidine  20 MG tablet Commonly known  as: PEPCID  Take 1 tablet (20 mg total) by mouth 2 (two) times daily. What changed:  when to take this reasons to take this   FLUoxetine  20 MG capsule Commonly known as: PROZAC  Take 1 capsule (20 mg total) by mouth daily. What changed: See the new instructions.   fluticasone  50 MCG/ACT nasal spray Commonly known as: FLONASE  Place 2 sprays into both nostrils daily. What changed:  when to take this reasons to take this   lidocaine -prilocaine  cream Commonly known as: EMLA  Apply 1 Application topically as needed (FOR INEJCTION PAIN).   methocarbamol  500 MG tablet Commonly known as: ROBAXIN  Take 1 tablet (500 mg total) by mouth every 6 (six) hours as needed for muscle spasms.   Oxycodone  HCl 10 MG Tabs Take 1 tablet (10 mg total) by mouth every 4 (four) hours.   predniSONE  5 MG tablet Commonly known as: DELTASONE  TAKE 1 TABLET BY MOUTH EVERY DAY   vitamin D3 25 MCG tablet Commonly known as: CHOLECALCIFEROL  Take 1 tablet (1,000 Units total) by mouth daily.   zolpidem  10 MG tablet Commonly known as: AMBIEN  TAKE 1 TABLET(10 MG) BY MOUTH AT BEDTIME AS NEEDED FOR SLEEP       Allergies  Allergen Reactions  Nsaids Other (See Comments)    Crohn's disease = NO NSAIDs   Codeine Nausea And Vomiting   Iodinated Contrast Media     Elevated BP,Itchy skin. Face red in color   Mesalamine  Nausea And Vomiting   Omnipaque  [Iohexol ] Itching    Itching and flushed/red face   Oxycodone  Itching and Rash   Lactose Intolerance (Gi) Diarrhea   Norco [Hydrocodone -Acetaminophen ] Nausea Only   Other Itching    PERFUMED LOTIONS  Cant have MRI due to ear implant    Sulfa Antibiotics Rash    Follow-up Information     Haddix, Florentina Huntsman, MD. Schedule an appointment as soon as possible for a visit in 2 week(s).   Specialty: Orthopedic Surgery Why: for wound check and repeat x-rays Contact information: [address] [place] [address] [phone]                  The  results of significant diagnostics from this hospitalization (including imaging, microbiology, ancillary and laboratory) are listed below for reference.    Significant Diagnostic Studies: DG Shoulder Right Result Date: 08/14/2023 CLINICAL DATA:  Right shoulder pain. EXAM: RIGHT SHOULDER - 3 VIEW COMPARISON:  None Available. FINDINGS: The glenoid may represent an old injury. No suggestive an acute fracture or dislocation. Acromioclavicular degenerative change with narrowing and osteophyte formation. IMPRESSION: Acromioclavicular degenerative changes. No acute osseous abnormalities. Electronically Signed   By: Sydell Eva M.D.   On: 08/14/2023 21:29   DG Pelvis Portable Result Date: 08/14/2023 CLINICAL DATA:  Status post proximal left femoral intramedullary nail fixation. EXAM: PORTABLE PELVIS 1-2 VIEWS COMPARISON:  Left hip radiographs 08/14/2023 FINDINGS: Partial visualization of new cephalomedullary nail fixation of the previously seen proximal femoral intertrochanteric fracture. No evidence of hardware failure. Moderate pubic symphysis a mild bilateral sacroiliac osteoarthritis. Moderate bilateral superomedial femoroacetabular joint space narrowing. Postoperative changes including lateral left hip and thigh subcutaneous air. IMPRESSION: Partial visualization of new cephalomedullary nail fixation of the previously seen proximal left femoral intertrochanteric fracture. Electronically Signed   By: Bertina Broccoli M.D.   On: 08/14/2023 16:51   DG FEMUR MIN 2 VIEWS LEFT Result Date: 08/14/2023 CLINICAL DATA:  Status post intracranial placement of left femur. EXAM: LEFT FEMUR 2 VIEWS COMPARISON:  Pelvis and left hip radiographs 08/14/2023 FINDINGS: Interval placement of long cephalomedullary nail fixating the previously seen proximal left femoral intertrochanteric fracture. Improved alignment. No evidence of hardware complication. No knee joint effusion. Moderate patellofemoral joint space narrowing.  Lateral left hip postsurgical subcutaneous air. IMPRESSION: Interval placement of long cephalomedullary nail fixating the previously seen proximal left femoral intertrochanteric fracture. Electronically Signed   By: Bertina Broccoli M.D.   On: 08/14/2023 16:48   DG FEMUR MIN 2 VIEWS LEFT Result Date: 08/14/2023 CLINICAL DATA:  Intertrochanteric fracture fixation. Intraoperative fluoroscopy. EXAM: LEFT FEMUR 2 VIEWS COMPARISON:  Pelvis and left hip radiographs 08/14/2023 FINDINGS: Images were performed intraoperatively without the presence of a radiologist. Redemonstration of oblique fracture of the proximal left intertrochanteric femur. The patient is undergoing long femoral cephalomedullary nail fixation of the fracture. No hardware complication is seen. Total fluoroscopy images: 7 Total fluoroscopy time: 91 seconds Total dose: Radiation Exposure Index (as provided by the fluoroscopic device): 9.6 mGy air Kerma Please see intraoperative findings for further detail. IMPRESSION: Intraoperative fluoroscopy for long femoral cephalomedullary nail fixation of the proximal left intertrochanteric femoral fracture. Electronically Signed   By: Bertina Broccoli M.D.   On: 08/14/2023 16:47   DG C-Arm 1-60 Min-No Report Result  Date: 08/14/2023 Fluoroscopy was utilized by the requesting physician.  No radiographic interpretation.   DG Chest 1 View Result Date: 08/14/2023 CLINICAL DATA:  Medical clearance for surgical intervention EXAM: CHEST  1 VIEW COMPARISON:  None Available. FINDINGS: The lungs are mildly hyperinflated in keeping with changes of underlying COPD. Opacity involving the left mid lung zone a likely relates to overlying left breast implant. Lungs are clear. No pneumothorax or pleural effusion. Cardiac size within normal limits. No acute bone abnormality. Bilateral breast implants. Multiple surgical clips within the breast. IMPRESSION: 1. No active disease. COPD. Electronically Signed   By: Worthy Heads M.D.    On: 08/14/2023 02:23   DG Hip Unilat W or Wo Pelvis 2-3 Views Left Result Date: 08/14/2023 CLINICAL DATA:  Fall, left hip pain EXAM: DG HIP (WITH OR WITHOUT PELVIS) 2-3V LEFT COMPARISON:  None Available. FINDINGS: There is an acute, intratrochanteric left hip fracture with 1 shaft with medial displacement, 1 cm override, and varus angulation of the distal fracture fragment. Femoral head is still seated left acetabulum. Mild bilateral degenerative hip arthritis. Pelvis and visualized right hip are intact. Left hip periarticular soft tissue swelling. IMPRESSION: 1. Acute intratrochanteric left hip fracture as described above. Electronically Signed   By: Worthy Heads M.D.   On: 08/14/2023 02:17   CT HEAD WO CONTRAST Result Date: 08/14/2023 CLINICAL DATA:  Head trauma, minor (Age >= 65y); Neck trauma (Age >= 65y) fall EXAM: CT HEAD WITHOUT CONTRAST CT CERVICAL SPINE WITHOUT CONTRAST TECHNIQUE: Multidetector CT imaging of the head and cervical spine was performed following the standard protocol without intravenous contrast. Multiplanar CT image reconstructions of the cervical spine were also generated. RADIATION DOSE REDUCTION: This exam was performed according to the departmental dose-optimization program which includes automated exposure control, adjustment of the mA and/or kV according to patient size and/or use of iterative reconstruction technique. COMPARISON:  None Available. FINDINGS: CT HEAD FINDINGS Brain: Normal anatomic configuration. Parenchymal volume loss is commensurate with the patient's age. Mild periventricular white matter changes are present likely reflecting the sequela of small vessel ischemia. No abnormal intra or extra-axial mass lesion or fluid collection. No abnormal mass effect or midline shift. No evidence of acute intracranial hemorrhage or infarct. Ventricular size is normal. Cerebellum unremarkable. Vascular: No asymmetric hyperdense vasculature at the skull base. Skull: Intact  Sinuses/Orbits: Paranasal sinuses are clear. Orbits are unremarkable. Other: Mastoid air cells and middle ear cavities are clear. Small left parietal scalp hematoma. CT CERVICAL SPINE FINDINGS Alignment: Normal. Skull base and vertebrae: No acute fracture. No primary bone lesion or focal pathologic process. Soft tissues and spinal canal: No prevertebral fluid or swelling. No visible canal hematoma. Disc levels: There is intervertebral disc space narrowing and endplate remodeling throughout the cervical spine, most severe at C5-C7 in keeping with changes of moderate to severe degenerative disc disease. Prevertebral soft tissues are not thickened on sagittal reformats. No high-grade canal stenosis. Multilevel uncovertebral and facet arthrosis results multilevel neuroforaminal narrowing, most severe on the left at C5-6 and to a lesser extent on the right at C5-6 and bilaterally at C6-7. Upper chest: Negative. Other: None removed IMPRESSION: 1. No acute intracranial abnormality. 2. Small left parietal scalp hematoma. No calvarial fracture. 3. No acute fracture of the cervical spine. 4. Multilevel degenerative changes of the cervical spine, most severe at C5-C7. 5. Multilevel uncovertebral and facet arthrosis results in multilevel neuroforaminal narrowing, most severe on the left at C5-6 and to a lesser extent on the right at  C5-6 and bilaterally at C6-7. Electronically Signed   By: Worthy Heads M.D.   On: 08/14/2023 01:52   CT CERVICAL SPINE WO CONTRAST Result Date: 08/14/2023 CLINICAL DATA:  Head trauma, minor (Age >= 65y); Neck trauma (Age >= 65y) fall EXAM: CT HEAD WITHOUT CONTRAST CT CERVICAL SPINE WITHOUT CONTRAST TECHNIQUE: Multidetector CT imaging of the head and cervical spine was performed following the standard protocol without intravenous contrast. Multiplanar CT image reconstructions of the cervical spine were also generated. RADIATION DOSE REDUCTION: This exam was performed according to the  departmental dose-optimization program which includes automated exposure control, adjustment of the mA and/or kV according to patient size and/or use of iterative reconstruction technique. COMPARISON:  None Available. FINDINGS: CT HEAD FINDINGS Brain: Normal anatomic configuration. Parenchymal volume loss is commensurate with the patient's age. Mild periventricular white matter changes are present likely reflecting the sequela of small vessel ischemia. No abnormal intra or extra-axial mass lesion or fluid collection. No abnormal mass effect or midline shift. No evidence of acute intracranial hemorrhage or infarct. Ventricular size is normal. Cerebellum unremarkable. Vascular: No asymmetric hyperdense vasculature at the skull base. Skull: Intact Sinuses/Orbits: Paranasal sinuses are clear. Orbits are unremarkable. Other: Mastoid air cells and middle ear cavities are clear. Small left parietal scalp hematoma. CT CERVICAL SPINE FINDINGS Alignment: Normal. Skull base and vertebrae: No acute fracture. No primary bone lesion or focal pathologic process. Soft tissues and spinal canal: No prevertebral fluid or swelling. No visible canal hematoma. Disc levels: There is intervertebral disc space narrowing and endplate remodeling throughout the cervical spine, most severe at C5-C7 in keeping with changes of moderate to severe degenerative disc disease. Prevertebral soft tissues are not thickened on sagittal reformats. No high-grade canal stenosis. Multilevel uncovertebral and facet arthrosis results multilevel neuroforaminal narrowing, most severe on the left at C5-6 and to a lesser extent on the right at C5-6 and bilaterally at C6-7. Upper chest: Negative. Other: None removed IMPRESSION: 1. No acute intracranial abnormality. 2. Small left parietal scalp hematoma. No calvarial fracture. 3. No acute fracture of the cervical spine. 4. Multilevel degenerative changes of the cervical spine, most severe at C5-C7. 5. Multilevel  uncovertebral and facet arthrosis results in multilevel neuroforaminal narrowing, most severe on the left at C5-6 and to a lesser extent on the right at C5-6 and bilaterally at C6-7. Electronically Signed   By: Worthy Heads M.D.   On: 08/14/2023 01:52    Microbiology: No results found for this or any previous visit (from the past 240 hours).   Labs: Basic Metabolic Panel: Recent Labs  Lab 08/19/23 0704 08/20/23 0604 08/21/23 0423 08/22/23 0632 08/23/23 0525  NA 132* 131* 133* 133* 130*  K 4.3 3.9 4.2 3.8 3.8  CL 96* 96* 98 99 97*  CO2 26 24 24 25 24   GLUCOSE 97 100* 93 88 92  BUN 14 15 18 16 15   CREATININE 0.76 0.84 0.83 0.93 0.87  CALCIUM  8.5* 8.5* 8.8* 8.5* 8.5*   Liver Function Tests: Recent Labs  Lab 08/19/23 0704  AST 30  ALT 18  ALKPHOS 52  BILITOT 1.0  PROT 5.4*  ALBUMIN  2.4*   No results for input(s): "LIPASE", "AMYLASE" in the last 168 hours. No results for input(s): "AMMONIA" in the last 168 hours. CBC: Recent Labs  Lab 08/19/23 0704 08/20/23 0604 08/21/23 0423 08/22/23 0632 08/23/23 0525  WBC 9.1 8.7 8.2 7.7 7.6  NEUTROABS  --  6.0 5.4 5.2 5.1  HGB 10.1* 10.0* 9.5* 9.5* 9.5*  HCT 30.3* 29.8* 28.5* 28.9* 28.7*  MCV 94.7 95.2 96.9 96.0 95.7  PLT 211 238 239 261 280   Cardiac Enzymes: No results for input(s): "CKTOTAL", "CKMB", "CKMBINDEX", "TROPONINI" in the last 168 hours. BNP: BNP (last 3 results) No results for input(s): "BNP" in the last 8760 hours.  ProBNP (last 3 results) No results for input(s): "PROBNP" in the last 8760 hours.  CBG: No results for input(s): "GLUCAP" in the last 168 hours.  Signed:  Verlie Glisson MD   [facility] 08/25/2023, 10:08 AM

## 2023-08-26 DIAGNOSIS — S72142D Displaced intertrochanteric fracture of left femur, subsequent encounter for closed fracture with routine healing: Secondary | ICD-10-CM | POA: Diagnosis not present

## 2023-08-26 DIAGNOSIS — I480 Paroxysmal atrial fibrillation: Secondary | ICD-10-CM | POA: Diagnosis not present

## 2023-08-26 DIAGNOSIS — F411 Generalized anxiety disorder: Secondary | ICD-10-CM | POA: Diagnosis not present

## 2023-08-26 DIAGNOSIS — Z79899 Other long term (current) drug therapy: Secondary | ICD-10-CM | POA: Diagnosis not present

## 2023-08-26 DIAGNOSIS — R52 Pain, unspecified: Secondary | ICD-10-CM | POA: Diagnosis not present

## 2023-08-26 DIAGNOSIS — F324 Major depressive disorder, single episode, in partial remission: Secondary | ICD-10-CM | POA: Diagnosis not present

## 2023-08-26 DIAGNOSIS — Z7189 Other specified counseling: Secondary | ICD-10-CM | POA: Diagnosis not present

## 2023-08-27 DIAGNOSIS — J324 Chronic pansinusitis: Secondary | ICD-10-CM | POA: Diagnosis not present

## 2023-08-27 DIAGNOSIS — F5101 Primary insomnia: Secondary | ICD-10-CM | POA: Diagnosis not present

## 2023-08-27 DIAGNOSIS — R4589 Other symptoms and signs involving emotional state: Secondary | ICD-10-CM | POA: Diagnosis not present

## 2023-08-27 DIAGNOSIS — R160 Hepatomegaly, not elsewhere classified: Secondary | ICD-10-CM | POA: Diagnosis not present

## 2023-08-27 DIAGNOSIS — S72142D Displaced intertrochanteric fracture of left femur, subsequent encounter for closed fracture with routine healing: Secondary | ICD-10-CM | POA: Diagnosis not present

## 2023-08-27 DIAGNOSIS — Z9181 History of falling: Secondary | ICD-10-CM | POA: Diagnosis not present

## 2023-08-27 DIAGNOSIS — R198 Other specified symptoms and signs involving the digestive system and abdomen: Secondary | ICD-10-CM | POA: Diagnosis not present

## 2023-08-27 DIAGNOSIS — C50919 Malignant neoplasm of unspecified site of unspecified female breast: Secondary | ICD-10-CM | POA: Diagnosis not present

## 2023-08-27 DIAGNOSIS — F411 Generalized anxiety disorder: Secondary | ICD-10-CM | POA: Diagnosis not present

## 2023-08-28 ENCOUNTER — Telehealth: Payer: Self-pay | Admitting: Hematology

## 2023-08-28 ENCOUNTER — Telehealth: Payer: Self-pay

## 2023-08-28 ENCOUNTER — Other Ambulatory Visit: Payer: Self-pay

## 2023-08-28 DIAGNOSIS — Z9181 History of falling: Secondary | ICD-10-CM | POA: Diagnosis not present

## 2023-08-28 DIAGNOSIS — S72142D Displaced intertrochanteric fracture of left femur, subsequent encounter for closed fracture with routine healing: Secondary | ICD-10-CM | POA: Diagnosis not present

## 2023-08-28 DIAGNOSIS — R52 Pain, unspecified: Secondary | ICD-10-CM | POA: Diagnosis not present

## 2023-08-28 DIAGNOSIS — F411 Generalized anxiety disorder: Secondary | ICD-10-CM | POA: Diagnosis not present

## 2023-08-28 NOTE — Progress Notes (Signed)
 [facility] Liaison Note  08/28/2023  Sara Barnes June 03, 1940 [id number]  Location: RN Hospital Liaison screened the patient remotely at [facility].  Insurance: [facility] HMO   Sara Barnes is a 83 y.o. female who is a Primary Care Patient of Crecencio Dodge, Candida Chalk, DO  [facility]. The patient was screened for readmission hospitalization with noted high risk score for unplanned readmission risk with 1 IP in 6 months.  The patient was assessed for potential Care Management service needs for post hospital transition for care coordination. Review of patient's electronic medical record reveals patient was admitted due to fracture of the left hip. Pt recommended for SNF level of care and transitioned to [facility]. This facility will continue to address pt's ongoing needs.    [facility] Care Management/Population Health does not replace or interfere with any arrangements made by the Inpatient Transition of Care team.   For questions contact:   Lilla Reichert, RN, BSN Hospital Liaison [place]   [facility], Population Health Office Hours MTWF  8:00 am-6:00 pm Direct Dial: [phone] mobile [email or web address]

## 2023-08-28 NOTE — Telephone Encounter (Signed)
 Pt's sister called stating that the pt fell and fractured her hip.  Pt's sister stated the pt is currently in a Rehab d/t this injury.  Pt and sister would like to know if Dr. Maryalice Smaller will be resuming pt's tx while the pt is currently in the Rehab.  Pt's sister stated that the Rehab will arrange transportation for the pt to come to her doctor's appts.  Stated this nurse will make Dr. Maryalice Smaller and her Team aware of their concerns.  Stated someone from Dr. Candise Chambers team will follow-up with their concern/s.

## 2023-08-31 DIAGNOSIS — F411 Generalized anxiety disorder: Secondary | ICD-10-CM | POA: Diagnosis not present

## 2023-08-31 DIAGNOSIS — R52 Pain, unspecified: Secondary | ICD-10-CM | POA: Diagnosis not present

## 2023-08-31 DIAGNOSIS — Z9181 History of falling: Secondary | ICD-10-CM | POA: Diagnosis not present

## 2023-08-31 DIAGNOSIS — S72142D Displaced intertrochanteric fracture of left femur, subsequent encounter for closed fracture with routine healing: Secondary | ICD-10-CM | POA: Diagnosis not present

## 2023-09-02 DIAGNOSIS — R52 Pain, unspecified: Secondary | ICD-10-CM | POA: Diagnosis not present

## 2023-09-02 DIAGNOSIS — Z9181 History of falling: Secondary | ICD-10-CM | POA: Diagnosis not present

## 2023-09-02 DIAGNOSIS — F411 Generalized anxiety disorder: Secondary | ICD-10-CM | POA: Diagnosis not present

## 2023-09-02 DIAGNOSIS — S72142D Displaced intertrochanteric fracture of left femur, subsequent encounter for closed fracture with routine healing: Secondary | ICD-10-CM | POA: Diagnosis not present

## 2023-09-06 ENCOUNTER — Encounter (HOSPITAL_COMMUNITY): Payer: Self-pay | Admitting: Radiology

## 2023-09-06 ENCOUNTER — Emergency Department (HOSPITAL_COMMUNITY)

## 2023-09-06 ENCOUNTER — Inpatient Hospital Stay (HOSPITAL_COMMUNITY)
Admission: EM | Admit: 2023-09-06 | Discharge: 2023-09-27 | DRG: 871 | Disposition: E | Attending: Internal Medicine | Admitting: Internal Medicine

## 2023-09-06 ENCOUNTER — Other Ambulatory Visit: Payer: Self-pay

## 2023-09-06 DIAGNOSIS — K769 Liver disease, unspecified: Secondary | ICD-10-CM | POA: Diagnosis not present

## 2023-09-06 DIAGNOSIS — N182 Chronic kidney disease, stage 2 (mild): Secondary | ICD-10-CM | POA: Diagnosis present

## 2023-09-06 DIAGNOSIS — A4189 Other specified sepsis: Secondary | ICD-10-CM | POA: Diagnosis present

## 2023-09-06 DIAGNOSIS — Z48813 Encounter for surgical aftercare following surgery on the respiratory system: Secondary | ICD-10-CM | POA: Diagnosis not present

## 2023-09-06 DIAGNOSIS — R059 Cough, unspecified: Secondary | ICD-10-CM | POA: Diagnosis not present

## 2023-09-06 DIAGNOSIS — E222 Syndrome of inappropriate secretion of antidiuretic hormone: Secondary | ICD-10-CM | POA: Diagnosis present

## 2023-09-06 DIAGNOSIS — J984 Other disorders of lung: Secondary | ICD-10-CM | POA: Diagnosis not present

## 2023-09-06 DIAGNOSIS — F411 Generalized anxiety disorder: Secondary | ICD-10-CM | POA: Diagnosis present

## 2023-09-06 DIAGNOSIS — J9601 Acute respiratory failure with hypoxia: Secondary | ICD-10-CM | POA: Diagnosis present

## 2023-09-06 DIAGNOSIS — J123 Human metapneumovirus pneumonia: Secondary | ICD-10-CM | POA: Diagnosis present

## 2023-09-06 DIAGNOSIS — C50312 Malignant neoplasm of lower-inner quadrant of left female breast: Secondary | ICD-10-CM | POA: Diagnosis present

## 2023-09-06 DIAGNOSIS — G9341 Metabolic encephalopathy: Secondary | ICD-10-CM | POA: Diagnosis present

## 2023-09-06 DIAGNOSIS — Z515 Encounter for palliative care: Secondary | ICD-10-CM | POA: Diagnosis not present

## 2023-09-06 DIAGNOSIS — Z8781 Personal history of (healed) traumatic fracture: Secondary | ICD-10-CM | POA: Diagnosis not present

## 2023-09-06 DIAGNOSIS — C50919 Malignant neoplasm of unspecified site of unspecified female breast: Secondary | ICD-10-CM

## 2023-09-06 DIAGNOSIS — E871 Hypo-osmolality and hyponatremia: Secondary | ICD-10-CM | POA: Diagnosis not present

## 2023-09-06 DIAGNOSIS — Z91041 Radiographic dye allergy status: Secondary | ICD-10-CM

## 2023-09-06 DIAGNOSIS — J910 Malignant pleural effusion: Secondary | ICD-10-CM | POA: Diagnosis present

## 2023-09-06 DIAGNOSIS — R6520 Severe sepsis without septic shock: Secondary | ICD-10-CM | POA: Diagnosis present

## 2023-09-06 DIAGNOSIS — I480 Paroxysmal atrial fibrillation: Secondary | ICD-10-CM | POA: Diagnosis present

## 2023-09-06 DIAGNOSIS — Z452 Encounter for adjustment and management of vascular access device: Secondary | ICD-10-CM | POA: Diagnosis not present

## 2023-09-06 DIAGNOSIS — Z79899 Other long term (current) drug therapy: Secondary | ICD-10-CM

## 2023-09-06 DIAGNOSIS — E8770 Fluid overload, unspecified: Secondary | ICD-10-CM | POA: Diagnosis not present

## 2023-09-06 DIAGNOSIS — A419 Sepsis, unspecified organism: Secondary | ICD-10-CM | POA: Diagnosis present

## 2023-09-06 DIAGNOSIS — C7951 Secondary malignant neoplasm of bone: Secondary | ICD-10-CM | POA: Diagnosis present

## 2023-09-06 DIAGNOSIS — Z66 Do not resuscitate: Secondary | ICD-10-CM | POA: Diagnosis not present

## 2023-09-06 DIAGNOSIS — I4891 Unspecified atrial fibrillation: Secondary | ICD-10-CM | POA: Diagnosis not present

## 2023-09-06 DIAGNOSIS — E663 Overweight: Secondary | ICD-10-CM | POA: Diagnosis present

## 2023-09-06 DIAGNOSIS — R918 Other nonspecific abnormal finding of lung field: Secondary | ICD-10-CM | POA: Diagnosis not present

## 2023-09-06 DIAGNOSIS — Z1152 Encounter for screening for COVID-19: Secondary | ICD-10-CM

## 2023-09-06 DIAGNOSIS — R0600 Dyspnea, unspecified: Secondary | ICD-10-CM | POA: Diagnosis not present

## 2023-09-06 DIAGNOSIS — C792 Secondary malignant neoplasm of skin: Secondary | ICD-10-CM | POA: Diagnosis present

## 2023-09-06 DIAGNOSIS — I10 Essential (primary) hypertension: Secondary | ICD-10-CM | POA: Diagnosis not present

## 2023-09-06 DIAGNOSIS — Z8719 Personal history of other diseases of the digestive system: Secondary | ICD-10-CM | POA: Diagnosis not present

## 2023-09-06 DIAGNOSIS — Z4682 Encounter for fitting and adjustment of non-vascular catheter: Secondary | ICD-10-CM | POA: Diagnosis not present

## 2023-09-06 DIAGNOSIS — S72142D Displaced intertrochanteric fracture of left femur, subsequent encounter for closed fracture with routine healing: Secondary | ICD-10-CM

## 2023-09-06 DIAGNOSIS — R54 Age-related physical debility: Secondary | ICD-10-CM | POA: Diagnosis present

## 2023-09-06 DIAGNOSIS — C50912 Malignant neoplasm of unspecified site of left female breast: Secondary | ICD-10-CM

## 2023-09-06 DIAGNOSIS — K5730 Diverticulosis of large intestine without perforation or abscess without bleeding: Secondary | ICD-10-CM | POA: Diagnosis not present

## 2023-09-06 DIAGNOSIS — B9781 Human metapneumovirus as the cause of diseases classified elsewhere: Secondary | ICD-10-CM | POA: Diagnosis present

## 2023-09-06 DIAGNOSIS — T7840XA Allergy, unspecified, initial encounter: Secondary | ICD-10-CM | POA: Diagnosis not present

## 2023-09-06 DIAGNOSIS — E876 Hypokalemia: Secondary | ICD-10-CM | POA: Diagnosis present

## 2023-09-06 DIAGNOSIS — E785 Hyperlipidemia, unspecified: Secondary | ICD-10-CM | POA: Diagnosis present

## 2023-09-06 DIAGNOSIS — Z7189 Other specified counseling: Secondary | ICD-10-CM | POA: Diagnosis not present

## 2023-09-06 DIAGNOSIS — F40240 Claustrophobia: Secondary | ICD-10-CM | POA: Diagnosis present

## 2023-09-06 DIAGNOSIS — Z7952 Long term (current) use of systemic steroids: Secondary | ICD-10-CM

## 2023-09-06 DIAGNOSIS — K5090 Crohn's disease, unspecified, without complications: Secondary | ICD-10-CM | POA: Diagnosis present

## 2023-09-06 DIAGNOSIS — I129 Hypertensive chronic kidney disease with stage 1 through stage 4 chronic kidney disease, or unspecified chronic kidney disease: Secondary | ICD-10-CM | POA: Diagnosis present

## 2023-09-06 DIAGNOSIS — R457 State of emotional shock and stress, unspecified: Secondary | ICD-10-CM | POA: Diagnosis not present

## 2023-09-06 DIAGNOSIS — Z7901 Long term (current) use of anticoagulants: Secondary | ICD-10-CM

## 2023-09-06 DIAGNOSIS — E872 Acidosis, unspecified: Secondary | ICD-10-CM | POA: Diagnosis present

## 2023-09-06 DIAGNOSIS — T508X5A Adverse effect of diagnostic agents, initial encounter: Secondary | ICD-10-CM | POA: Diagnosis present

## 2023-09-06 DIAGNOSIS — R590 Localized enlarged lymph nodes: Secondary | ICD-10-CM | POA: Diagnosis not present

## 2023-09-06 DIAGNOSIS — Z6828 Body mass index (BMI) 28.0-28.9, adult: Secondary | ICD-10-CM

## 2023-09-06 DIAGNOSIS — C801 Malignant (primary) neoplasm, unspecified: Secondary | ICD-10-CM | POA: Diagnosis not present

## 2023-09-06 DIAGNOSIS — R0603 Acute respiratory distress: Secondary | ICD-10-CM | POA: Diagnosis present

## 2023-09-06 DIAGNOSIS — C787 Secondary malignant neoplasm of liver and intrahepatic bile duct: Secondary | ICD-10-CM | POA: Diagnosis present

## 2023-09-06 DIAGNOSIS — J189 Pneumonia, unspecified organism: Principal | ICD-10-CM

## 2023-09-06 DIAGNOSIS — F32A Depression, unspecified: Secondary | ICD-10-CM | POA: Diagnosis present

## 2023-09-06 DIAGNOSIS — R339 Retention of urine, unspecified: Secondary | ICD-10-CM | POA: Diagnosis not present

## 2023-09-06 DIAGNOSIS — Z79818 Long term (current) use of other agents affecting estrogen receptors and estrogen levels: Secondary | ICD-10-CM

## 2023-09-06 DIAGNOSIS — J90 Pleural effusion, not elsewhere classified: Secondary | ICD-10-CM | POA: Diagnosis not present

## 2023-09-06 DIAGNOSIS — I517 Cardiomegaly: Secondary | ICD-10-CM | POA: Diagnosis not present

## 2023-09-06 DIAGNOSIS — W19XXXD Unspecified fall, subsequent encounter: Secondary | ICD-10-CM | POA: Diagnosis present

## 2023-09-06 DIAGNOSIS — R0602 Shortness of breath: Secondary | ICD-10-CM | POA: Diagnosis not present

## 2023-09-06 DIAGNOSIS — C7931 Secondary malignant neoplasm of brain: Secondary | ICD-10-CM | POA: Diagnosis not present

## 2023-09-06 DIAGNOSIS — R0989 Other specified symptoms and signs involving the circulatory and respiratory systems: Secondary | ICD-10-CM | POA: Diagnosis not present

## 2023-09-06 DIAGNOSIS — J168 Pneumonia due to other specified infectious organisms: Secondary | ICD-10-CM | POA: Diagnosis not present

## 2023-09-06 DIAGNOSIS — J9690 Respiratory failure, unspecified, unspecified whether with hypoxia or hypercapnia: Secondary | ICD-10-CM | POA: Diagnosis not present

## 2023-09-06 LAB — COMPREHENSIVE METABOLIC PANEL WITH GFR
ALT: 11 U/L (ref 0–44)
AST: 16 U/L (ref 15–41)
Albumin: 3.4 g/dL — ABNORMAL LOW (ref 3.5–5.0)
Alkaline Phosphatase: 142 U/L — ABNORMAL HIGH (ref 38–126)
Anion gap: 10 (ref 5–15)
BUN: 7 mg/dL — ABNORMAL LOW (ref 8–23)
CO2: 23 mmol/L (ref 22–32)
Calcium: 8.7 mg/dL — ABNORMAL LOW (ref 8.9–10.3)
Chloride: 97 mmol/L — ABNORMAL LOW (ref 98–111)
Creatinine, Ser: 0.8 mg/dL (ref 0.44–1.00)
GFR, Estimated: >60 mL/min (ref >60)
Glucose, Bld: 136 mg/dL — ABNORMAL HIGH (ref 70–99)
Potassium: 2.8 mmol/L — ABNORMAL LOW (ref 3.5–5.1)
Sodium: 130 mmol/L — ABNORMAL LOW (ref 135–145)
Total Bilirubin: 0.6 mg/dL (ref 0.0–1.2)
Total Protein: 6.5 g/dL (ref 6.5–8.1)

## 2023-09-06 LAB — CBC WITH DIFFERENTIAL/PLATELET
Abs Immature Granulocytes: 0.03 10*3/uL (ref 0.00–0.07)
Basophils Absolute: 0 10*3/uL (ref 0.0–0.1)
Basophils Relative: 0 %
Eosinophils Absolute: 0.1 10*3/uL (ref 0.0–0.5)
Eosinophils Relative: 1 %
HCT: 34.2 % — ABNORMAL LOW (ref 36.0–46.0)
Hemoglobin: 11 g/dL — ABNORMAL LOW (ref 12.0–15.0)
Immature Granulocytes: 1 %
Lymphocytes Relative: 28 %
Lymphs Abs: 1.7 10*3/uL (ref 0.7–4.0)
MCH: 31.1 pg (ref 26.0–34.0)
MCHC: 32.2 g/dL (ref 30.0–36.0)
MCV: 96.6 fL (ref 80.0–100.0)
Monocytes Absolute: 0.6 10*3/uL (ref 0.1–1.0)
Monocytes Relative: 10 %
Neutro Abs: 3.7 10*3/uL (ref 1.7–7.7)
Neutrophils Relative %: 60 %
Platelets: 278 10*3/uL (ref 150–400)
RBC: 3.54 MIL/uL — ABNORMAL LOW (ref 3.87–5.11)
RDW: 11.9 % (ref 11.5–15.5)
WBC: 6 10*3/uL (ref 4.0–10.5)
nRBC: 0 % (ref 0.0–0.2)

## 2023-09-06 LAB — PROTIME-INR
INR: 1 (ref 0.8–1.2)
Prothrombin Time: 13.7 s (ref 11.4–15.2)

## 2023-09-06 LAB — LACTIC ACID, PLASMA: Lactic Acid, Venous: 2.1 mmol/L (ref 0.5–1.9)

## 2023-09-06 MED ORDER — LACTATED RINGERS IV BOLUS
1000.0000 mL | Freq: Once | INTRAVENOUS | Status: AC
  Administered 2023-09-06: 1000 mL via INTRAVENOUS

## 2023-09-06 MED ORDER — DIPHENHYDRAMINE HCL 25 MG PO CAPS: 50.0000 mg | ORAL_CAPSULE | Freq: Once | ORAL | Status: DC

## 2023-09-06 MED ORDER — PREDNISONE 20 MG PO TABS: 60.0000 mg | ORAL_TABLET | Freq: Once | ORAL | Status: DC

## 2023-09-06 MED ORDER — METOPROLOL TARTRATE 5 MG/5ML IV SOLN: 5.0000 mg | INTRAVENOUS | Status: DC | PRN

## 2023-09-06 MED ORDER — DIPHENHYDRAMINE HCL 25 MG PO CAPS
50.0000 mg | ORAL_CAPSULE | Freq: Once | ORAL | Status: AC
  Administered 2023-09-06: 50 mg via ORAL
  Filled 2023-09-06: qty 2

## 2023-09-06 MED ORDER — SODIUM CHLORIDE 0.9 % IV SOLN
1.0000 g | Freq: Once | INTRAVENOUS | Status: AC
  Administered 2023-09-06: 1 g via INTRAVENOUS
  Filled 2023-09-06: qty 10

## 2023-09-06 MED ORDER — METOPROLOL TARTRATE 5 MG/5ML IV SOLN
5.0000 mg | Freq: Once | INTRAVENOUS | Status: AC
  Administered 2023-09-06: 5 mg via INTRAVENOUS
  Filled 2023-09-06: qty 5

## 2023-09-06 MED ORDER — SODIUM CHLORIDE 0.9 % IV SOLN
500.0000 mg | Freq: Once | INTRAVENOUS | Status: AC
  Administered 2023-09-06: 500 mg via INTRAVENOUS
  Filled 2023-09-06: qty 5

## 2023-09-06 NOTE — Progress Notes (Signed)
 Pt being followed by [facility] for Sepsis protocol.

## 2023-09-06 NOTE — ED Notes (Signed)
 Critical lactic 2.1. MD Kermit Ped made aware.

## 2023-09-06 NOTE — ED Triage Notes (Signed)
 Pt from [facility] for respiratory distress. She is there for rehab from having hip repair 1 month ago. Pt was wheezing on EMS arrival.  15 albuterol  1.0 atrovent  125mg  solumedrol  20G right wrist  125/60 78 10% neb 28-32 Resp

## 2023-09-06 NOTE — ED Provider Notes (Signed)
 From SNF. SOB an cough x 1 day. On Lovenox . Treated for pna. Had Afib w/ RVR. Given metoprolol . PE study pending. Predmedicated for contrast allergy. Cta at 2am. Not on O2 at baseline. Currently on 2L. Admit after CTA. Physical Exam  BP (!) 141/72 (BP Location: Right Arm)   Pulse 99   Temp 98.6 F (37 C) (Oral)   Resp 17   Ht 5\' 6"  (1.676 m)   Wt 78.9 kg   SpO2 99%   BMI 28.08 kg/m   Physical Exam Vitals and nursing note reviewed.  Constitutional:      General: She is not in acute distress.    Appearance: Normal appearance. She is well-developed. She is not toxic-appearing or diaphoretic.  HENT:     Head: Normocephalic and atraumatic.     Right Ear: External ear normal.     Left Ear: External ear normal.     Nose: Nose normal.     Mouth/Throat:     Mouth: Mucous membranes are moist.  Eyes:     Extraocular Movements: Extraocular movements intact.     Conjunctiva/sclera: Conjunctivae normal.  Cardiovascular:     Rate and Rhythm: Normal rate and regular rhythm.  Pulmonary:     Effort: Pulmonary effort is normal. No respiratory distress.     Breath sounds: Decreased breath sounds present.  Abdominal:     General: There is no distension.     Palpations: Abdomen is soft.     Tenderness: There is no abdominal tenderness.  Musculoskeletal:        General: No swelling.     Cervical back: Normal range of motion and neck supple.  Skin:    General: Skin is warm and dry.     Coloration: Skin is not jaundiced or pale.  Neurological:     General: No focal deficit present.     Mental Status: She is alert.  Psychiatric:        Mood and Affect: Mood normal.        Behavior: Behavior normal.     Procedures  Procedures  ED Course / MDM    Medical Decision Making Amount and/or Complexity of Data Reviewed Labs: ordered. Radiology: ordered.  Risk Prescription drug management.   On assessment, patient is sleeping.  Heart rate remains elevated in the 120s.  She is easily  awakened.  When awake, heart rate is in the range of 140s.  Will initiate heparin  and diltiazem gtt.  Currently, SpO2 is in the mid to high 80s on room air.  She was placed on 3 L of supplemental oxygen.  Lab work was notable for hypokalemia.  Replacement potassium was ordered.  Patient underwent CTA of chest.  Despite pretreatment, she did endorse some pruritus afterwards.  Benadryl  and Pepcid  were ordered.  Patient also described worsening shortness of breath.  She does have wheezing and increased work of breathing on exam.  BiPAP was ordered.  CTA shows patchy right upper lobe infiltrates.  There is no evidence of PE.  Patient was admitted for further management.       Iva Mariner, MD 09/07/23 [phone]

## 2023-09-06 NOTE — ED Provider Notes (Addendum)
 [place] EMERGENCY DEPARTMENT AT [facility] Provider Note  CSN: [id number] Arrival date & time: 09/06/23 2205  Chief Complaint(s) Respiratory Distress  HPI Sara Barnes is a 83 y.o. female who is here today for respiratory distress.  Patient has a history of breast cancer.  Patient was discharged from [facility] on 4/29 after she had a fall with resulting hip fracture.  Patient has been getting daily Lovenox  at her skilled nursing facility.   Past Medical History Past Medical History:  Diagnosis Date   Allergy    Anemia    Anxiety    Arthritis    Basal cell carcinoma    Blood transfusion without reported diagnosis    Bowel perforation (HCC)    Cancer of lower-inner quadrant of female breast (HCC) 11/30/2014   left   Cancer of lower-inner quadrant of left female breast (HCC) 11/30/2014   Clostridium difficile infection    Complication of anesthesia    anxiety post-op after ear surgeries   Crohn's disease of large intestine with abscess (HCC)    Dental crowns present    recent root canal 11/2014   Depression    Family history of adverse reaction to anesthesia    pt's sister has hx. 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   Osteopenia of neck of left femur 03/26/2020   Preventative health care 03/26/2020   Closed fracture of shaft of right fibula with routine healing 02/02/2020   Hyponatremia 01/23/2020   Urinary frequency 01/23/2020   Dysphagia 01/23/2020   Acute pansinusitis 01/08/2018   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 06/02/2017 05/28/2017   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 05/21/2017   Right ear pain 05/19/2017   Dizziness 05/19/2017   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 03/24/2017   Chronic diastolic CHF (congestive heart failure) (HCC) 08/05/2016   Chest discomfort 06/21/2016   Nausea and vomiting    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   Small bowel obstruction (HCC)    Ileus (HCC)    Generalized anxiety disorder 02/14/2016   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   Hypokalemia    Fever    Crohn's disease of colon with complication (HCC)    Other depression due to general medical condition    Difficulty in walking, not elsewhere classified    Acute blood loss anemia    Absolute anemia    Left upper quadrant pain  Abdominal pain 01/07/2016   GI bleed 01/07/2016   IBS (irritable bowel syndrome)    Tenesmus    Ulcerative pancolitis with rectal bleeding (HCC)    Diarrhea 12/24/2015   Crohn disease (HCC) 12/24/2015   Dehydration    Ulcerative pancolitis with abscess [facility])    Rectal pain    Leucocytosis    Cancer of lower-inner quadrant of left female breast (HCC) 11/30/2014   Perirectal abscess 09/29/2013   Vitamin B12 deficiency 08/12/2006   Anxiety and depression 08/12/2006   Home Medication(s) Prior to Admission medications   Medication Sig Start Date End Date Taking? Authorizing Provider  acetaminophen  (TYLENOL ) 500 MG tablet You can take 2 tablets every  6 hours as needed for pain.  Use this as your primary pain control.  Use Tramadol  as a last resort.  You can buy this over the counter at any drug store.    DO NOT TAKE MORE THAN 4000 MG OF TYLENOL  PER DAY.  IT CAN HARM YOUR LIVER. 03/31/17   Monetta Angst, PA-C  ALPRAZolam  (XANAX ) 0.25 MG tablet Take 1 tablet (0.25 mg total) by mouth 2 (two) times daily as needed for anxiety. 08/25/23   Samtani, Jai-Gurmukh, MD  cholecalciferol  (CHOLECALCIFEROL ) 25 MCG tablet Take 1 tablet (1,000 Units total) by mouth daily. 08/20/23   Versie Gores, PA-C  cyanocobalamin  (VITAMIN B12) 1000 MCG/ML injection INJECT 1ML INTO THE MUSCLE ONCE PER MONTH 03/27/22   Pyrtle, Amber Bail, MD  diphenoxylate -atropine  (LOMOTIL ) 2.5-0.025 MG tablet TAKE 1 TO 2 TABLETS BY MOUTH THREE TO FOUR TIMES DAILY AS NEEDED FOR SEVERE DIARRHEA 05/21/23   Zehr, Jessica D, PA-C  enoxaparin  (LOVENOX ) 40 MG/0.4ML injection Inject 0.4 mLs (40 mg total) into the skin daily. 08/20/23 09/19/23  Versie Gores, PA-C  famotidine  (PEPCID ) 20 MG tablet Take 1 tablet (20 mg total) by mouth 2 (two) times daily. Patient taking differently: Take 20 mg by mouth daily as needed for heartburn. 01/16/21   Pyrtle, Amber Bail, MD  FLUoxetine  (PROZAC ) 20 MG capsule Take 1 capsule (20 mg total) by mouth daily. 08/25/23 09/24/23  Samtani, Jai-Gurmukh, MD  fluticasone  (FLONASE ) 50 MCG/ACT nasal spray Place 2 sprays into both nostrils daily. Patient taking differently: Place 2 sprays into both nostrils daily as needed for rhinitis. 11/12/20   Lowne Chase, Yvonne R, DO  lidocaine -prilocaine  (EMLA ) cream Apply 1 Application topically as needed (FOR INEJCTION PAIN). Patient not taking: Reported on 08/14/2023 05/18/23   Sonja [place], MD  methocarbamol  (ROBAXIN ) 500 MG tablet Take 1 tablet (500 mg total) by mouth every 6 (six) hours as needed for muscle spasms. 08/25/23   Samtani, Jai-Gurmukh, MD  oxyCODONE  10 MG TABS Take 1 tablet (10 mg total) by mouth every 4 (four) hours. 08/25/23    Samtani, Jai-Gurmukh, MD  predniSONE  (DELTASONE ) 5 MG tablet TAKE 1 TABLET BY MOUTH EVERY DAY 05/21/23   Zehr, Jessica D, PA-C  zolpidem  (AMBIEN ) 10 MG tablet TAKE 1 TABLET(10 MG) BY MOUTH AT BEDTIME AS NEEDED FOR SLEEP 08/25/23   Samtani, Jai-Gurmukh, MD  Past Surgical History Past Surgical History:  Procedure Laterality Date   ABDOMINAL HYSTERECTOMY     partial   APPENDECTOMY     AUGMENTATION MAMMAPLASTY Bilateral    BASAL CELL CARCINOMA EXCISION Left    nose   BREAST BIOPSY Left 2019   benign   BREAST ENHANCEMENT SURGERY     BREAST LUMPECTOMY Left 2016   BREAST LUMPECTOMY WITH RADIOACTIVE SEED AND SENTINEL LYMPH NODE BIOPSY Left 12/13/2014   Procedure: LEFT BREAST LUMPECTOMY WITH RADIOACTIVE SEED AND SENTINEL LYMPH NODE BIOPSY;  Surgeon: Lockie Rima, MD;  Location: [facility];  Service: General;  Laterality: Left;   COLONOSCOPY WITH PROPOFOL   05/28/2011; 06/28/2014   EXPLORATORY LAPAROTOMY  04/08/2018   with end ileostomy takedown and creation of loop ileostomy-- [facility]-Dr Aretta Beery   FLEXIBLE SIGMOIDOSCOPY N/A 01/08/2016   Procedure: Marlynn Singer;  Surgeon: Danette Duos, MD;  Location: [facility];  Service: Gastroenterology;  Laterality: N/A;  needs MAC due to pain and anxiety   ILEOSTOMY N/A 05/25/2017   Procedure: DILATION OF ILEOSTOMY;  Surgeon: Enid Harry, MD;  Location: [facility];  Service: General;  Laterality: N/A;   ILEOSTOMY  02/11/2016   END ileostomy.  Dr Gaylyn Keas   ILEOSTOMY CLOSURE N/A 06/02/2017   Procedure: RESECTION OF ILEAL STRICTURE; ILEOSTOMY REVISION ;  Surgeon: Candyce Champagne, MD;  Location: [facility];  Service: General;  Laterality: N/A;   ILEOSTOMY DILATION  05/25/2017   DILITATION OF ILEAL STRICTURE   ileostomy stricture resection  06/02/2017   with revision   INCISION  AND DRAINAGE ABSCESS  2018   INCISION AND DRAINAGE PERIRECTAL ABSCESS N/A 09/28/2013   Procedure: IRRIGATION AND DEBRIDEMENT PERIANAL ABSCESS, proctoscopy;  Surgeon: Thayne Fine, MD;  Location: [facility];  Service: General;  Laterality: N/A;   INCISION AND DRAINAGE PERIRECTAL ABSCESS N/A 01/22/2016   Procedure: IRRIGATION AND DEBRIDEMENT PERIRECTAL ABSCESS;  Surgeon: Oralee Billow, MD;  Location: [facility];  Service: General;  Laterality: N/A;   INNER EAR SURGERY Bilateral    INTRAMEDULLARY (IM) NAIL INTERTROCHANTERIC Left 08/14/2023   Procedure: FIXATION, FRACTURE, INTERTROCHANTERIC, WITH INTRAMEDULLARY ROD;  Surgeon: Laneta Pintos, MD;  Location: [facility];  Service: Orthopedics;  Laterality: Left;   LAPAROSCOPIC DIVERTED COLOSTOMY N/A 02/11/2016   Procedure: LAPAROSCOPIC DIVERTED ILEOSTOMY;  Surgeon: Jacolyn Matar, MD;  Location: [facility];  Service: General;  Laterality: N/A;   LAPAROSCOPY N/A 03/23/2017   Procedure: LAPAROSCOPY DIAGNOSTIC, EXPLORATORY LAPAROTOMY,  BOWEL RESECTION;  Surgeon: Adalberto Hollow, MD;  Location: [facility];  Service: General;  Laterality: N/A;   Family History Family History  Problem Relation Age of Onset   Prostate cancer Brother 18   Kidney disease Brother    Diabetes Brother    Brain cancer Father    Rheum arthritis Mother    Anesthesia problems Sister        post-op N/V   Emphysema Maternal Grandfather    Asthma Sister    Rheum arthritis Brother    Esophageal cancer Neg Hx    Breast cancer Neg Hx    Rectal cancer Neg Hx    Stomach cancer Neg Hx    Colon cancer Neg Hx     Social History Social History   Tobacco Use   Smoking status: Never   Smokeless tobacco: Never  Vaping Use   Vaping status: Never Used  Substance Use Topics   Alcohol use: Not Currently    Alcohol/week: 7.0 standard drinks of alcohol    Types:  7 Glasses of wine per week    Comment: wine daily   Drug use: No   Allergies Nsaids, Codeine, Iodinated contrast media, Mesalamine ,  Omnipaque  [iohexol ], Oxycodone , Lactose intolerance (gi), Norco [hydrocodone -acetaminophen ], Other, and Sulfa antibiotics  Review of Systems Review of Systems  Physical Exam Vital Signs  I have reviewed the triage vital signs BP (!) 141/72 (BP Location: Right Arm)   Pulse 99   Temp 98.6 F (37 C) (Oral)   Resp 17   Ht 5\' 6"  (1.676 m)   Wt 78.9 kg   SpO2 99%   BMI 28.08 kg/m   Physical Exam Vitals and nursing note reviewed.  Constitutional:      Appearance: She is not toxic-appearing.  HENT:     Head: Normocephalic and atraumatic.  Cardiovascular:     Rate and Rhythm: Tachycardia present.  Pulmonary:     Effort: Respiratory distress present.     Comments: Tachypnea, diffuse wheezing    ED Results and Treatments Labs (all labs ordered are listed, but only abnormal results are displayed) Labs Reviewed  CULTURE, BLOOD (ROUTINE X 2)  CULTURE, BLOOD (ROUTINE X 2)  RESP PANEL BY RT-PCR (RSV, FLU A&B, COVID)  RVPGX2  COMPREHENSIVE METABOLIC PANEL WITH GFR  CBC WITH DIFFERENTIAL/PLATELET  PROTIME-INR  LACTIC ACID, PLASMA  LACTIC ACID, PLASMA                                                                                                                          Radiology No results found.  Pertinent labs & imaging results that were available during my care of the patient were reviewed by me and considered in my medical decision making (see MDM for details).  Medications Ordered in ED Medications  cefTRIAXone (ROCEPHIN) 1 g in sodium chloride  0.9 % 100 mL IVPB (has no administration in time range)  azithromycin  (ZITHROMAX ) 500 mg in sodium chloride  0.9 % 250 mL IVPB (has no administration in time range)  diphenhydrAMINE  (BENADRYL ) capsule 50 mg (has no administration in time range)  lactated ringers  bolus 1,000 mL (has no administration in time range)  lactated ringers  bolus 1,000 mL (has no administration in time range)                                                                                                                                      Procedures .Critical Care  Performed  by: Nathanael Baker, DO Authorized by: Nathanael Baker, DO   Critical care provider statement:    Critical care time (minutes):  33   Critical care was necessary to treat or prevent imminent or life-threatening deterioration of the following conditions:  Respiratory failure   Critical care was time spent personally by me on the following activities:  Development of treatment plan with patient or surrogate, discussions with consultants, evaluation of patient's response to treatment, examination of patient, ordering and review of laboratory studies, ordering and review of radiographic studies, ordering and performing treatments and interventions, pulse oximetry, re-evaluation of patient's condition and review of old charts   (including critical care time)  Medical Decision Making / ED Course   This patient presents to the ED for concern of shortness of breath and respiratory distress, this involves an extensive number of treatment options, and is a complaint that carries with it a high risk of complications and morbidity.  The differential diagnosis includes pneumonia, pulmonary embolism, viral syndrome, bronchitis, pulmonary edema.  MDM: On exam, patient tachypneic, does have diffuse wheezing.  Will obtain chest x-ray, perform an infectious workup on the patient.  My independent review the patient's chest x-ray does show consolidation on the left lower lobe.  Will treat the patient with antibiotics.  Given her history of cancer, will obtain imaging of the patient's chest to assess for pulmonary emboli.  I do not appreciate any overt swelling on the patient, lower suspicion for pulmonary edema.  Patient without history of COPD or heart failure.  Patiently signed out to Dr. Kermit Ped pending labs, imaging and admission.  Patient will require CTA for pulmonary embolism.  She  received 125 mg of Solu-Medrol  by EMS.  After receiving DuoNebs, patient's heart rate increased to the 150s.  His atrial fibrillation.  Patient is a history of A-fib.  Believe this is excitability due to the beta agonist she received.  I am providing the patient with metoprolol .   Additional history obtained: -Additional history obtained from sister at bedside -External records from outside source obtained and reviewed including: Chart review including previous notes, labs, imaging, consultation notes   Lab Tests: -I ordered, reviewed, and interpreted labs.   The pertinent results include:   Labs Reviewed  CULTURE, BLOOD (ROUTINE X 2)  CULTURE, BLOOD (ROUTINE X 2)  RESP PANEL BY RT-PCR (RSV, FLU A&B, COVID)  RVPGX2  COMPREHENSIVE METABOLIC PANEL WITH GFR  CBC WITH DIFFERENTIAL/PLATELET  PROTIME-INR  LACTIC ACID, PLASMA  LACTIC ACID, PLASMA         Medicines ordered and prescription drug management: Meds ordered this encounter  Medications   DISCONTD: diphenhydrAMINE  (BENADRYL ) capsule 50 mg   cefTRIAXone (ROCEPHIN) 1 g in sodium chloride  0.9 % 100 mL IVPB    Antibiotic Indication::   CAP   azithromycin  (ZITHROMAX ) 500 mg in sodium chloride  0.9 % 250 mL IVPB   DISCONTD: predniSONE  (DELTASONE ) tablet 60 mg   diphenhydrAMINE  (BENADRYL ) capsule 50 mg   lactated ringers  bolus 1,000 mL   lactated ringers  bolus 1,000 mL    -I have reviewed the patients home medicines and have made adjustments as needed  Critical interventions Management of acute respiratory failure    Cardiac Monitoring: The patient was maintained on a cardiac monitor.  I personally viewed and interpreted the cardiac monitored which showed an underlying rhythm of: Sinus tachycardia  Social Determinants of Health:  Factors impacting patients care include: Multiple medical comorbidities, lack of access to primary care   Reevaluation:  After the interventions noted above, I reevaluated the patient and  found that they have :improved  Co morbidities that complicate the patient evaluation  Past Medical History:  Diagnosis Date   Allergy    Anemia    Anxiety    Arthritis    Basal cell carcinoma    Blood transfusion without reported diagnosis    Bowel perforation (HCC)    Cancer of lower-inner quadrant of female breast (HCC) 11/30/2014   left   Cancer of lower-inner quadrant of left female breast (HCC) 11/30/2014   Clostridium difficile infection    Complication of anesthesia    anxiety post-op after ear surgeries   Crohn's disease of large intestine with abscess (HCC)    Dental crowns present    recent root canal 11/2014   Depression    Family history of adverse reaction to anesthesia    pt's sister has hx. 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   Seasonal allergies    TMJ (temporomandibular joint syndrome)    Ulcerative colitis    Vitamin B12 deficiency       Dispostion: Signed out to Dr. Kermit Ped.     Final Clinical Impression(s) / ED Diagnoses Final diagnoses:  Respiratory distress     @PCDICTATION @    Afton Horse T, DO 09/06/23 2257    Afton Horse T, DO 09/06/23 2305

## 2023-09-07 ENCOUNTER — Emergency Department (HOSPITAL_COMMUNITY)

## 2023-09-07 ENCOUNTER — Inpatient Hospital Stay (HOSPITAL_COMMUNITY)

## 2023-09-07 ENCOUNTER — Encounter (HOSPITAL_COMMUNITY): Payer: Self-pay | Admitting: Internal Medicine

## 2023-09-07 ENCOUNTER — Other Ambulatory Visit: Payer: Self-pay

## 2023-09-07 DIAGNOSIS — T7840XA Allergy, unspecified, initial encounter: Secondary | ICD-10-CM | POA: Diagnosis not present

## 2023-09-07 DIAGNOSIS — Z7189 Other specified counseling: Secondary | ICD-10-CM | POA: Diagnosis not present

## 2023-09-07 DIAGNOSIS — J189 Pneumonia, unspecified organism: Secondary | ICD-10-CM

## 2023-09-07 DIAGNOSIS — C7951 Secondary malignant neoplasm of bone: Secondary | ICD-10-CM | POA: Diagnosis present

## 2023-09-07 DIAGNOSIS — E871 Hypo-osmolality and hyponatremia: Secondary | ICD-10-CM | POA: Diagnosis not present

## 2023-09-07 DIAGNOSIS — Z515 Encounter for palliative care: Secondary | ICD-10-CM | POA: Diagnosis not present

## 2023-09-07 DIAGNOSIS — J123 Human metapneumovirus pneumonia: Secondary | ICD-10-CM | POA: Diagnosis present

## 2023-09-07 DIAGNOSIS — C50919 Malignant neoplasm of unspecified site of unspecified female breast: Secondary | ICD-10-CM

## 2023-09-07 DIAGNOSIS — W19XXXD Unspecified fall, subsequent encounter: Secondary | ICD-10-CM | POA: Diagnosis present

## 2023-09-07 DIAGNOSIS — B9781 Human metapneumovirus as the cause of diseases classified elsewhere: Secondary | ICD-10-CM | POA: Diagnosis present

## 2023-09-07 DIAGNOSIS — A419 Sepsis, unspecified organism: Secondary | ICD-10-CM | POA: Diagnosis present

## 2023-09-07 DIAGNOSIS — Z8781 Personal history of (healed) traumatic fracture: Secondary | ICD-10-CM | POA: Insufficient documentation

## 2023-09-07 DIAGNOSIS — E8770 Fluid overload, unspecified: Secondary | ICD-10-CM | POA: Diagnosis not present

## 2023-09-07 DIAGNOSIS — K5090 Crohn's disease, unspecified, without complications: Secondary | ICD-10-CM | POA: Diagnosis present

## 2023-09-07 DIAGNOSIS — I4891 Unspecified atrial fibrillation: Secondary | ICD-10-CM

## 2023-09-07 DIAGNOSIS — E876 Hypokalemia: Secondary | ICD-10-CM | POA: Diagnosis present

## 2023-09-07 DIAGNOSIS — Z66 Do not resuscitate: Secondary | ICD-10-CM | POA: Diagnosis not present

## 2023-09-07 DIAGNOSIS — R0603 Acute respiratory distress: Secondary | ICD-10-CM | POA: Diagnosis present

## 2023-09-07 DIAGNOSIS — C787 Secondary malignant neoplasm of liver and intrahepatic bile duct: Secondary | ICD-10-CM | POA: Diagnosis present

## 2023-09-07 DIAGNOSIS — C792 Secondary malignant neoplasm of skin: Secondary | ICD-10-CM | POA: Diagnosis present

## 2023-09-07 DIAGNOSIS — N182 Chronic kidney disease, stage 2 (mild): Secondary | ICD-10-CM | POA: Insufficient documentation

## 2023-09-07 DIAGNOSIS — A4189 Other specified sepsis: Secondary | ICD-10-CM | POA: Diagnosis present

## 2023-09-07 DIAGNOSIS — Z8719 Personal history of other diseases of the digestive system: Secondary | ICD-10-CM

## 2023-09-07 DIAGNOSIS — J910 Malignant pleural effusion: Secondary | ICD-10-CM | POA: Diagnosis present

## 2023-09-07 DIAGNOSIS — C50312 Malignant neoplasm of lower-inner quadrant of left female breast: Secondary | ICD-10-CM | POA: Diagnosis present

## 2023-09-07 DIAGNOSIS — R339 Retention of urine, unspecified: Secondary | ICD-10-CM | POA: Diagnosis not present

## 2023-09-07 DIAGNOSIS — J90 Pleural effusion, not elsewhere classified: Secondary | ICD-10-CM | POA: Diagnosis not present

## 2023-09-07 DIAGNOSIS — C50912 Malignant neoplasm of unspecified site of left female breast: Secondary | ICD-10-CM

## 2023-09-07 DIAGNOSIS — J9601 Acute respiratory failure with hypoxia: Secondary | ICD-10-CM | POA: Insufficient documentation

## 2023-09-07 DIAGNOSIS — E222 Syndrome of inappropriate secretion of antidiuretic hormone: Secondary | ICD-10-CM | POA: Diagnosis present

## 2023-09-07 DIAGNOSIS — C7931 Secondary malignant neoplasm of brain: Secondary | ICD-10-CM | POA: Diagnosis not present

## 2023-09-07 DIAGNOSIS — Z1152 Encounter for screening for COVID-19: Secondary | ICD-10-CM | POA: Diagnosis not present

## 2023-09-07 DIAGNOSIS — F32A Depression, unspecified: Secondary | ICD-10-CM | POA: Diagnosis present

## 2023-09-07 DIAGNOSIS — I480 Paroxysmal atrial fibrillation: Secondary | ICD-10-CM | POA: Diagnosis present

## 2023-09-07 DIAGNOSIS — G9341 Metabolic encephalopathy: Secondary | ICD-10-CM | POA: Diagnosis present

## 2023-09-07 DIAGNOSIS — R6520 Severe sepsis without septic shock: Secondary | ICD-10-CM | POA: Diagnosis present

## 2023-09-07 DIAGNOSIS — E872 Acidosis, unspecified: Secondary | ICD-10-CM | POA: Diagnosis present

## 2023-09-07 DIAGNOSIS — I129 Hypertensive chronic kidney disease with stage 1 through stage 4 chronic kidney disease, or unspecified chronic kidney disease: Secondary | ICD-10-CM | POA: Diagnosis present

## 2023-09-07 DIAGNOSIS — E785 Hyperlipidemia, unspecified: Secondary | ICD-10-CM | POA: Diagnosis present

## 2023-09-07 LAB — CBC
HCT: 32.8 % — ABNORMAL LOW (ref 36.0–46.0)
HCT: 32.6 % — ABNORMAL LOW (ref 36.0–46.0)
Hemoglobin: 10.5 g/dL — ABNORMAL LOW (ref 12.0–15.0)
Hemoglobin: 10.3 g/dL — ABNORMAL LOW (ref 12.0–15.0)
MCH: 30.7 pg (ref 26.0–34.0)
MCH: 30.8 pg (ref 26.0–34.0)
MCHC: 32 g/dL (ref 30.0–36.0)
MCHC: 31.6 g/dL (ref 30.0–36.0)
MCV: 95.9 fL (ref 80.0–100.0)
MCV: 97.6 fL (ref 80.0–100.0)
Platelets: 264 10*3/uL (ref 150–400)
Platelets: 269 10*3/uL (ref 150–400)
RBC: 3.42 MIL/uL — ABNORMAL LOW (ref 3.87–5.11)
RBC: 3.34 MIL/uL — ABNORMAL LOW (ref 3.87–5.11)
RDW: 11.9 % (ref 11.5–15.5)
RDW: 11.9 % (ref 11.5–15.5)
WBC: 3.3 10*3/uL — ABNORMAL LOW (ref 4.0–10.5)
WBC: 3.9 10*3/uL — ABNORMAL LOW (ref 4.0–10.5)
nRBC: 0 % (ref 0.0–0.2)
nRBC: 0 % (ref 0.0–0.2)

## 2023-09-07 LAB — COMPREHENSIVE METABOLIC PANEL WITH GFR
ALT: 14 U/L (ref 0–44)
AST: 24 U/L (ref 15–41)
Albumin: 3.1 g/dL — ABNORMAL LOW (ref 3.5–5.0)
Alkaline Phosphatase: 122 U/L (ref 38–126)
Anion gap: 10 (ref 5–15)
BUN: 7 mg/dL — ABNORMAL LOW (ref 8–23)
CO2: 21 mmol/L — ABNORMAL LOW (ref 22–32)
Calcium: 8.6 mg/dL — ABNORMAL LOW (ref 8.9–10.3)
Chloride: 98 mmol/L (ref 98–111)
Creatinine, Ser: 0.83 mg/dL (ref 0.44–1.00)
GFR, Estimated: >60 mL/min (ref >60)
Glucose, Bld: 166 mg/dL — ABNORMAL HIGH (ref 70–99)
Potassium: 3.8 mmol/L (ref 3.5–5.1)
Sodium: 129 mmol/L — ABNORMAL LOW (ref 135–145)
Total Bilirubin: 0.6 mg/dL (ref 0.0–1.2)
Total Protein: 6.3 g/dL — ABNORMAL LOW (ref 6.5–8.1)

## 2023-09-07 LAB — BLOOD GAS, ARTERIAL
Acid-base deficit: 1.9 mmol/L (ref 0.0–2.0)
Bicarbonate: 22.2 mmol/L (ref 20.0–28.0)
Delivery systems: POSITIVE
Drawn by: 35043
Expiratory PAP: 5 cmH2O
FIO2: 40 %
Inspiratory PAP: 10 cmH2O
O2 Saturation: 99.7 %
Patient temperature: 36.7
RATE: 12 {breaths}/min
pCO2 arterial: 34 mmHg (ref 32–48)
pH, Arterial: 7.41 (ref 7.35–7.45)
pO2, Arterial: 107 mmHg (ref 83–108)

## 2023-09-07 LAB — ECHOCARDIOGRAM COMPLETE
Area-P 1/2: 3.65 cm2
Calc EF: 75.8 %
Height: 66 in
MV VTI: 2.75 cm2
P 1/2 time: 637 ms
S' Lateral: 3.2 cm
Single Plane A2C EF: 74 %
Single Plane A4C EF: 77.5 %
Weight: 2784 [oz_av]

## 2023-09-07 LAB — GLUCOSE, CAPILLARY
Glucose-Capillary: 137 mg/dL — ABNORMAL HIGH (ref 70–99)
Glucose-Capillary: 131 mg/dL — ABNORMAL HIGH (ref 70–99)
Glucose-Capillary: 134 mg/dL — ABNORMAL HIGH (ref 70–99)

## 2023-09-07 LAB — BRAIN NATRIURETIC PEPTIDE: B Natriuretic Peptide: 531.6 pg/mL — ABNORMAL HIGH (ref 0.0–100.0)

## 2023-09-07 LAB — HEPARIN LEVEL (UNFRACTIONATED)
Heparin Unfractionated: 0.72 [IU]/mL — ABNORMAL HIGH (ref 0.30–0.70)
Heparin Unfractionated: 0.6 [IU]/mL (ref 0.30–0.70)

## 2023-09-07 LAB — RESP PANEL BY RT-PCR (RSV, FLU A&B, COVID)  RVPGX2
Influenza A by PCR: NEGATIVE
Influenza B by PCR: NEGATIVE
Resp Syncytial Virus by PCR: NEGATIVE
SARS Coronavirus 2 by RT PCR: NEGATIVE

## 2023-09-07 LAB — CBG MONITORING, ED: Glucose-Capillary: 149 mg/dL — ABNORMAL HIGH (ref 70–99)

## 2023-09-07 LAB — MRSA NEXT GEN BY PCR, NASAL: MRSA by PCR Next Gen: NOT DETECTED

## 2023-09-07 LAB — MAGNESIUM: Magnesium: 1.6 mg/dL — ABNORMAL LOW (ref 1.7–2.4)

## 2023-09-07 LAB — LACTIC ACID, PLASMA
Lactic Acid, Venous: 2.8 mmol/L (ref 0.5–1.9)
Lactic Acid, Venous: 0.8 mmol/L (ref 0.5–1.9)

## 2023-09-07 LAB — I-STAT CG4 LACTIC ACID, ED: Lactic Acid, Venous: 4.1 mmol/L (ref 0.5–1.9)

## 2023-09-07 MED ORDER — OXYCODONE HCL 5 MG PO TABS
10.0000 mg | ORAL_TABLET | Freq: Three times a day (TID) | ORAL | Status: DC
  Administered 2023-09-07 – 2023-09-08 (×5): 10 mg via ORAL
  Filled 2023-09-07 (×6): qty 2

## 2023-09-07 MED ORDER — LACTATED RINGERS IV SOLN: INTRAVENOUS | Status: DC

## 2023-09-07 MED ORDER — FLUTICASONE PROPIONATE 50 MCG/ACT NA SUSP
2.0000 | Freq: Every day | NASAL | Status: DC
  Administered 2023-09-07 – 2023-09-19 (×12): 2 via NASAL
  Filled 2023-09-07: qty 16

## 2023-09-07 MED ORDER — MAGNESIUM SULFATE 4 GM/100ML IV SOLN
4.0000 g | Freq: Once | INTRAVENOUS | Status: AC
  Administered 2023-09-07: 4 g via INTRAVENOUS
  Filled 2023-09-07: qty 100

## 2023-09-07 MED ORDER — FAMOTIDINE 20 MG PO TABS
20.0000 mg | ORAL_TABLET | Freq: Two times a day (BID) | ORAL | Status: DC | PRN
Start: 2023-09-07 — End: 2023-09-21

## 2023-09-07 MED ORDER — ALPRAZOLAM 0.25 MG PO TABS
0.2500 mg | ORAL_TABLET | Freq: Two times a day (BID) | ORAL | Status: DC | PRN
  Administered 2023-09-07 – 2023-09-20 (×15): 0.25 mg via ORAL
  Filled 2023-09-07 (×16): qty 1

## 2023-09-07 MED ORDER — IPRATROPIUM BROMIDE 0.02 % IN SOLN
0.5000 mg | Freq: Four times a day (QID) | RESPIRATORY_TRACT | Status: DC
  Administered 2023-09-07: 0.5 mg via RESPIRATORY_TRACT
  Filled 2023-09-07: qty 2.5

## 2023-09-07 MED ORDER — SODIUM CHLORIDE 0.9% FLUSH
10.0000 mL | INTRAVENOUS | Status: DC | PRN
  Administered 2023-09-17: 30 mL
  Administered 2023-09-19 – 2023-09-21 (×3): 20 mL

## 2023-09-07 MED ORDER — ZOLPIDEM TARTRATE 5 MG PO TABS
5.0000 mg | ORAL_TABLET | Freq: Once | ORAL | Status: AC | PRN
  Administered 2023-09-07: 5 mg via ORAL
  Filled 2023-09-07: qty 1

## 2023-09-07 MED ORDER — SODIUM CHLORIDE 0.9 % IV SOLN: 250.0000 mL | INTRAVENOUS | Status: DC | PRN

## 2023-09-07 MED ORDER — SODIUM CHLORIDE 0.9% FLUSH
3.0000 mL | Freq: Two times a day (BID) | INTRAVENOUS | Status: DC
  Administered 2023-09-07 – 2023-09-08 (×3): 3 mL via INTRAVENOUS

## 2023-09-07 MED ORDER — ALPRAZOLAM 0.25 MG PO TABS
0.2500 mg | ORAL_TABLET | Freq: Once | ORAL | Status: DC
Start: 2023-09-07 — End: 2023-09-07
  Filled 2023-09-07: qty 1

## 2023-09-07 MED ORDER — LACTATED RINGERS IV BOLUS
1000.0000 mL | INTRAVENOUS | Status: AC
Start: 2023-09-07 — End: 2023-09-07
  Administered 2023-09-07: 1000 mL via INTRAVENOUS

## 2023-09-07 MED ORDER — IPRATROPIUM BROMIDE 0.02 % IN SOLN
0.5000 mg | Freq: Four times a day (QID) | RESPIRATORY_TRACT | Status: DC
  Filled 2023-09-07: qty 2.5

## 2023-09-07 MED ORDER — VANCOMYCIN HCL 1500 MG/300ML IV SOLN
1500.0000 mg | Freq: Once | INTRAVENOUS | Status: AC
  Administered 2023-09-07: 1500 mg via INTRAVENOUS
  Filled 2023-09-07: qty 300

## 2023-09-07 MED ORDER — METHYLPREDNISOLONE SODIUM SUCC 125 MG IJ SOLR
125.0000 mg | INTRAMUSCULAR | Status: AC
  Administered 2023-09-07: 125 mg via INTRAVENOUS
  Filled 2023-09-07: qty 2

## 2023-09-07 MED ORDER — DILTIAZEM HCL-DEXTROSE 125-5 MG/125ML-% IV SOLN (PREMIX)
5.0000 mg/h | INTRAVENOUS | Status: DC
  Administered 2023-09-07: 10 mg/h via INTRAVENOUS
  Administered 2023-09-07: 5 mg/h via INTRAVENOUS
  Administered 2023-09-08: 10 mg/h via INTRAVENOUS
  Filled 2023-09-07 (×3): qty 125

## 2023-09-07 MED ORDER — SODIUM CHLORIDE 0.9 % IV SOLN
2.0000 g | Freq: Two times a day (BID) | INTRAVENOUS | Status: DC
  Administered 2023-09-07 – 2023-09-09 (×5): 2 g via INTRAVENOUS
  Filled 2023-09-07 (×5): qty 12.5

## 2023-09-07 MED ORDER — ALPRAZOLAM 0.25 MG PO TABS
0.2500 mg | ORAL_TABLET | Freq: Once | ORAL | Status: AC
  Administered 2023-09-07: 0.25 mg via ORAL
  Filled 2023-09-07: qty 1

## 2023-09-07 MED ORDER — HEPARIN (PORCINE) 25000 UT/250ML-% IV SOLN
1300.0000 [IU]/h | INTRAVENOUS | Status: DC
  Administered 2023-09-07 (×2): 1100 [IU]/h via INTRAVENOUS
  Administered 2023-09-08: 950 [IU]/h via INTRAVENOUS
  Administered 2023-09-09: 1050 [IU]/h via INTRAVENOUS
  Administered 2023-09-10 – 2023-09-11 (×2): 1300 [IU]/h via INTRAVENOUS
  Filled 2023-09-07 (×6): qty 250

## 2023-09-07 MED ORDER — CHLORHEXIDINE GLUCONATE CLOTH 2 % EX PADS
6.0000 | MEDICATED_PAD | Freq: Every day | CUTANEOUS | Status: DC
  Administered 2023-09-07 – 2023-09-19 (×13): 6 via TOPICAL

## 2023-09-07 MED ORDER — POTASSIUM CHLORIDE 10 MEQ/100ML IV SOLN
10.0000 meq | INTRAVENOUS | Status: AC
Start: 2023-09-07 — End: 2023-09-07
  Administered 2023-09-07 (×2): 10 meq via INTRAVENOUS
  Filled 2023-09-07 (×3): qty 100

## 2023-09-07 MED ORDER — FAMOTIDINE IN NACL 20-0.9 MG/50ML-% IV SOLN
20.0000 mg | Freq: Two times a day (BID) | INTRAVENOUS | Status: AC
  Administered 2023-09-07 (×2): 20 mg via INTRAVENOUS
  Filled 2023-09-07 (×2): qty 50

## 2023-09-07 MED ORDER — SODIUM CHLORIDE 0.9% FLUSH
10.0000 mL | Freq: Two times a day (BID) | INTRAVENOUS | Status: DC
  Administered 2023-09-07 – 2023-09-14 (×9): 10 mL
  Administered 2023-09-15: 20 mL
  Administered 2023-09-15 – 2023-09-16 (×3): 10 mL
  Administered 2023-09-17: 30 mL

## 2023-09-07 MED ORDER — SODIUM CHLORIDE 0.9% FLUSH
3.0000 mL | Freq: Two times a day (BID) | INTRAVENOUS | Status: DC
  Administered 2023-09-07 – 2023-09-21 (×17): 3 mL via INTRAVENOUS

## 2023-09-07 MED ORDER — PREDNISONE 5 MG PO TABS: 5.0000 mg | ORAL_TABLET | Freq: Every day | ORAL | Status: DC

## 2023-09-07 MED ORDER — SODIUM CHLORIDE 0.9% FLUSH: 3.0000 mL | INTRAVENOUS | Status: DC | PRN

## 2023-09-07 MED ORDER — METHYLPREDNISOLONE SODIUM SUCC 40 MG IJ SOLR: 40.0000 mg | Freq: Two times a day (BID) | INTRAMUSCULAR | Status: DC

## 2023-09-07 MED ORDER — VANCOMYCIN HCL 1.25 G IV SOLR
1250.0000 mg | INTRAVENOUS | Status: DC
  Administered 2023-09-08: 1250 mg via INTRAVENOUS
  Filled 2023-09-07: qty 25

## 2023-09-07 MED ORDER — POTASSIUM CHLORIDE CRYS ER 20 MEQ PO TBCR
40.0000 meq | EXTENDED_RELEASE_TABLET | Freq: Once | ORAL | Status: DC
  Filled 2023-09-07: qty 2

## 2023-09-07 MED ORDER — POTASSIUM CHLORIDE 10 MEQ/100ML IV SOLN
10.0000 meq | INTRAVENOUS | Status: AC
  Administered 2023-09-07 (×2): 10 meq via INTRAVENOUS
  Filled 2023-09-07 (×2): qty 100

## 2023-09-07 MED ORDER — DIPHENHYDRAMINE HCL 50 MG/ML IJ SOLN
25.0000 mg | Freq: Once | INTRAMUSCULAR | Status: AC
  Administered 2023-09-07: 25 mg via INTRAVENOUS
  Filled 2023-09-07: qty 1

## 2023-09-07 MED ORDER — IOHEXOL 350 MG/ML SOLN
75.0000 mL | Freq: Once | INTRAVENOUS | Status: AC | PRN
  Administered 2023-09-07: 75 mL via INTRAVENOUS

## 2023-09-07 MED ORDER — HEPARIN BOLUS VIA INFUSION
3500.0000 [IU] | Freq: Once | INTRAVENOUS | Status: AC
  Administered 2023-09-07: 3500 [IU] via INTRAVENOUS
  Filled 2023-09-07: qty 3500

## 2023-09-07 MED ORDER — METHYLPREDNISOLONE SODIUM SUCC 40 MG IJ SOLR
40.0000 mg | Freq: Two times a day (BID) | INTRAMUSCULAR | Status: DC
  Administered 2023-09-07 – 2023-09-09 (×4): 40 mg via INTRAVENOUS
  Filled 2023-09-07 (×4): qty 1

## 2023-09-07 MED ORDER — LEVALBUTEROL HCL 0.63 MG/3ML IN NEBU: 0.6300 mg | INHALATION_SOLUTION | Freq: Four times a day (QID) | RESPIRATORY_TRACT | Status: DC

## 2023-09-07 MED ORDER — FLUOXETINE HCL 20 MG PO CAPS
20.0000 mg | ORAL_CAPSULE | Freq: Every day | ORAL | Status: DC
  Administered 2023-09-07 – 2023-09-19 (×13): 20 mg via ORAL
  Filled 2023-09-07 (×13): qty 1

## 2023-09-07 MED ORDER — DIPHENHYDRAMINE HCL 50 MG/ML IJ SOLN
25.0000 mg | Freq: Three times a day (TID) | INTRAMUSCULAR | Status: AC
  Administered 2023-09-07 – 2023-09-09 (×6): 25 mg via INTRAVENOUS
  Filled 2023-09-07 (×6): qty 1

## 2023-09-07 MED ORDER — FAMOTIDINE IN NACL 20-0.9 MG/50ML-% IV SOLN
20.0000 mg | Freq: Once | INTRAVENOUS | Status: AC
  Administered 2023-09-07: 20 mg via INTRAVENOUS
  Filled 2023-09-07: qty 50

## 2023-09-07 NOTE — Progress Notes (Signed)
  Echocardiogram 2D Echocardiogram has been performed.  Sara Barnes 09/07/2023, 1:39 PM

## 2023-09-07 NOTE — Progress Notes (Signed)
 Sara Barnes   DOB:26-Feb-1941   RU#:[id number]   [id number]#:[id number]  Medical oncology follow-up note  Subjective: Patient is well-known to me, under my care for her metastatic breast cancer.  She was hospitalized after a mechanical fall and left hip fracture on August 14, 2027, status post surgery.  She went to rehab after that, and was admitted back to hospital yesterday due to hypoxia and pneumonia, required a BiPAP initially.  She is on nasal cannula oxygen now.  She also developed a rapid A-fib in the hospital, was seen by cardiology.  She overall feels better today.   Objective:  Vitals:   09/07/23 1500 09/07/23 1600  BP: 138/64 (!) 136/114  Pulse: 86 91  Resp: (!) 21 (!) 21  Temp:    SpO2: 96% 96%    Body mass index is 28.08 kg/m.  Intake/Output Summary (Last 24 hours) at 09/07/2023 1825 Last data filed at 09/07/2023 1800 Gross per 24 hour  Intake 4061.07 ml  Output 700 ml  Net 3361.07 ml     Sclerae unicteric  Oropharynx clear  No peripheral adenopathy  Lungs clear -- no rales or rhonchi  Heart regular rate and rhythm  Abdomen benign  Neuro nonfocal    CBG (last 3)  Recent Labs    09/07/23 0637 09/07/23 1226 09/07/23 1804  GLUCAP 149* 137* 131*     Labs:  Lab Results  Component Value Date   WBC 3.9 (L) 09/07/2023   HGB 10.3 (L) 09/07/2023   HCT 32.6 (L) 09/07/2023   MCV 97.6 09/07/2023   PLT 269 09/07/2023   NEUTROABS 3.7 09/06/2023     Urine Studies No results for input(s): "UHGB", "CRYS" in the last 72 hours.  Invalid input(s): "UACOL", "UAPR", "USPG", "UPH", "UTP", "UGL", "UKET", "UBIL", "UNIT", "UROB", "ULEU", "UEPI", "UWBC", "URBC", "UBAC", "CAST", "UCOM", "BILUA"  Basic Metabolic Panel: Recent Labs  Lab 09/06/23 2239 09/07/23 0532 09/07/23 0640  NA 130*  --  129*  K 2.8*  --  3.8  CL 97*  --  98  CO2 23  --  21*  GLUCOSE 136*  --  166*  BUN 7*  --  7*  CREATININE 0.80  --  0.83  CALCIUM  8.7*  --  8.6*  MG  --  1.6*  --     GFR Estimated Creatinine Clearance: 56.3 mL/min (by C-G formula based on SCr of 0.83 mg/dL). Liver Function Tests: Recent Labs  Lab 09/06/23 2239 09/07/23 0640  AST 16 24  ALT 11 14  ALKPHOS 142* 122  BILITOT 0.6 0.6  PROT 6.5 6.3*  ALBUMIN  3.4* 3.1*   No results for input(s): "LIPASE", "AMYLASE" in the last 168 hours. No results for input(s): "AMMONIA" in the last 168 hours. Coagulation profile Recent Labs  Lab 09/06/23 2239  INR 1.0    CBC: Recent Labs  Lab 09/06/23 2239 09/07/23 0640 09/07/23 0930  WBC 6.0 3.3* 3.9*  NEUTROABS 3.7  --   --   HGB 11.0* 10.5* 10.3*  HCT 34.2* 32.8* 32.6*  MCV 96.6 95.9 97.6  PLT 278 264 269   Cardiac Enzymes: No results for input(s): "CKTOTAL", "CKMB", "CKMBINDEX", "TROPONINI" in the last 168 hours. BNP: Invalid input(s): "POCBNP" CBG: Recent Labs  Lab 09/07/23 0637 09/07/23 1226 09/07/23 1804  GLUCAP 149* 137* 131*   D-Dimer No results for input(s): "DDIMER" in the last 72 hours. Hgb A1c No results for input(s): "HGBA1C" in the last 72 hours. Lipid Profile No results for input(s): "CHOL", "HDL", "LDLCALC", "  TRIG", "CHOLHDL", "LDLDIRECT" in the last 72 hours. Thyroid  function studies No results for input(s): "TSH", "T4TOTAL", "T3FREE", "THYROIDAB" in the last 72 hours.  Invalid input(s): "FREET3" Anemia work up No results for input(s): "VITAMINB12", "FOLATE", "FERRITIN", "TIBC", "IRON", "RETICCTPCT" in the last 72 hours. Microbiology Recent Results (from the past 240 hours)  Blood Culture (routine x 2)     Status: None (Preliminary result)   Collection Time: 09/06/23 10:38 PM   Specimen: BLOOD  Result Value Ref Range Status   Specimen Description   Final    BLOOD SITE NOT SPECIFIED Performed at [facility], [address] [place], [place] [address]    Special Requests   Final    BOTTLES DRAWN AEROBIC AND ANAEROBIC Blood Culture results may not be optimal due to an inadequate volume of  blood received in culture bottles Performed at [facility], [address] [place], [place] [address]    Culture   Final    NO GROWTH < 12 HOURS Performed at [facility], [address] [place], [place] [address]    Report Status PENDING  Incomplete  Resp panel by RT-PCR (RSV, Flu A&B, Covid) Anterior Nasal Swab     Status: None   Collection Time: 09/06/23 11:16 PM   Specimen: Anterior Nasal Swab  Result Value Ref Range Status   SARS Coronavirus 2 by RT PCR NEGATIVE NEGATIVE Final    Comment: (NOTE) SARS-CoV-2 target nucleic acids are NOT DETECTED.  The SARS-CoV-2 RNA is generally detectable in upper respiratory specimens during the acute phase of infection. The lowest concentration of SARS-CoV-2 viral copies this assay can detect is 138 copies/mL. A negative result does not preclude SARS-Cov-2 infection and should not be used as the sole basis for treatment or other patient management decisions. A negative result may occur with  improper specimen collection/handling, submission of specimen other than nasopharyngeal swab, presence of viral mutation(s) within the areas targeted by this assay, and inadequate number of viral copies(<138 copies/mL). A negative result must be combined with clinical observations, patient history, and epidemiological information. The expected result is Negative.  Fact Sheet for Patients:  BloggerCourse.com  Fact Sheet for Healthcare Providers:  SeriousBroker.it  This test is no t yet approved or cleared by the [place]  FDA and  has been authorized for detection and/or diagnosis of SARS-CoV-2 by FDA under an Emergency Use Authorization (EUA). This EUA will remain  in effect (meaning this test can be used) for the duration of the COVID-19 declaration under Section 564(b)(1) of the Act, 21 U.S.C.section 360bbb-3(b)(1), unless the authorization is terminated  or revoked  sooner.       Influenza A by PCR NEGATIVE NEGATIVE Final   Influenza B by PCR NEGATIVE NEGATIVE Final    Comment: (NOTE) The Xpert Xpress SARS-CoV-2/FLU/RSV plus assay is intended as an aid in the diagnosis of influenza from Nasopharyngeal swab specimens and should not be used as a sole basis for treatment. Nasal washings and aspirates are unacceptable for Xpert Xpress SARS-CoV-2/FLU/RSV testing.  Fact Sheet for Patients: BloggerCourse.com  Fact Sheet for Healthcare Providers: SeriousBroker.it  This test is not yet approved or cleared by the [place]  FDA and has been authorized for detection and/or diagnosis of SARS-CoV-2 by FDA under an Emergency Use Authorization (EUA). This EUA will remain in effect (meaning this test can be used) for the duration of the COVID-19 declaration under Section 564(b)(1) of the Act, 21 U.S.C. section 360bbb-3(b)(1), unless the authorization is  terminated or revoked.     Resp Syncytial Virus by PCR NEGATIVE NEGATIVE Final    Comment: (NOTE) Fact Sheet for Patients: BloggerCourse.com  Fact Sheet for Healthcare Providers: SeriousBroker.it  This test is not yet approved or cleared by the [place]  FDA and has been authorized for detection and/or diagnosis of SARS-CoV-2 by FDA under an Emergency Use Authorization (EUA). This EUA will remain in effect (meaning this test can be used) for the duration of the COVID-19 declaration under Section 564(b)(1) of the Act, 21 U.S.C. section 360bbb-3(b)(1), unless the authorization is terminated or revoked.  Performed at [facility], [address] [place], [place] [address]   Blood Culture (routine x 2)     Status: None (Preliminary result)   Collection Time: 09/06/23 11:27 PM   Specimen: BLOOD RIGHT ARM  Result Value Ref Range Status   Specimen Description   Final    BLOOD  RIGHT ARM Performed at [facility], [address] [place], [place] [address]    Special Requests   Final    BOTTLES DRAWN AEROBIC ONLY Blood Culture adequate volume Performed at [facility], [address] [place], [place] [address]    Culture   Final    NO GROWTH < 12 HOURS Performed at [facility], [address], [place], [place] [address]    Report Status PENDING  Incomplete  MRSA Next Gen by PCR, Nasal     Status: None   Collection Time: 09/07/23 12:30 PM   Specimen: Nasal Mucosa; Nasal Swab  Result Value Ref Range Status   MRSA by PCR Next Gen NOT DETECTED NOT DETECTED Final    Comment: (NOTE) The GeneXpert MRSA Assay (FDA approved for NASAL specimens only), is one component of a comprehensive MRSA colonization surveillance program. It is not intended to diagnose MRSA infection nor to guide or monitor treatment for MRSA infections. Test performance is not FDA approved in patients less than 65 years old. Performed at [facility], [address] [place], [place] [address]       Studies:  US  EKG SITE RITE Result Date: 09/07/2023 If Site Rite image not attached, placement could not be confirmed due to current cardiac rhythm.  CT Angio Chest PE W and/or Wo Contrast Result Date: 09/07/2023 CLINICAL DATA:  Suspected pulmonary embolism. EXAM: CT ANGIOGRAPHY CHEST WITH CONTRAST TECHNIQUE: Multidetector CT imaging of the chest was performed using the standard protocol during bolus administration of intravenous contrast. Multiplanar CT image reconstructions and MIPs were obtained to evaluate the vascular anatomy. RADIATION DOSE REDUCTION: This exam was performed according to the departmental dose-optimization program which includes automated exposure control, adjustment of the mA and/or kV according to patient size and/or use of iterative reconstruction technique. CONTRAST:  75mL OMNIPAQUE  IOHEXOL  350 MG/ML SOLN COMPARISON:   April 24, 2023 FINDINGS: Cardiovascular: There is mild calcification of the aortic arch, without evidence of aortic aneurysm. Satisfactory opacification of the pulmonary arteries to the segmental level. No evidence of pulmonary embolism. Normal heart size. A small amount of pericardial fluid is noted. Mediastinum/Nodes: There is mild right hilar lymphadenopathy. A 9 mm anterior mediastinal lymph node is seen. This corresponds to an area of hypermetabolic activity seen on the prior nuclear medicine/PET scan (May 11, 2023). Thyroid  gland, trachea, and esophagus demonstrate no significant findings. Lungs/Pleura: Small patchy right apical and anterior right upper lobe infiltrates are seen. Mild left apical scarring and/or atelectasis is also noted. There is mild  moderate severity bibasilar dependent atelectasis, left greater than right. There is a small right pleural effusion. A moderate size left pleural effusion is also seen. No pneumothorax is identified. Upper Abdomen: There is a small hiatal hernia. Musculoskeletal: Bilateral breast implants are seen. A 1.8 cm lytic lesion is seen within the posterior aspect of the T4 vertebral body on the left (axial CT image 35, CT series 5). This likely corresponds to the hypermetabolic focus seen within this region on the patient's prior nuclear medicine PET/CT (May 11, 2023). Mixed lytic and sclerotic areas are again seen involving the T8 and T10 vertebral bodies, consistent with healed metastatic disease. Anterior third and fourth left rib deformities are seen which corresponds to hypermetabolic areas seen on the prior nuclear medicine PET/CT. Multilevel degenerative changes are present throughout the thoracic spine. Review of the MIP images confirms the above findings. IMPRESSION: 1. No evidence of pulmonary embolism. 2. Small patchy right apical and anterior right upper lobe infiltrates. 3. Small right pleural effusion with a moderate size left pleural effusion.  4. Small, stable anterior mediastinal lymph node which corresponds to a hypermetabolic focus seen on the prior nuclear medicine PET/CT. 5. Findings consistent with osseous metastatic disease, as described above. 6. Small hiatal hernia. 7. Aortic atherosclerosis. Electronically Signed   By: Virgle Grime M.D.   On: 09/07/2023 03:43   DG Chest Port 1 View Result Date: 09/06/2023 CLINICAL DATA:  Respiratory distress. EXAM: PORTABLE CHEST 1 VIEW COMPARISON:  August 14, 2023 FINDINGS: The heart size and mediastinal contours are within normal limits. The lungs are hyperinflated. Marked severity atelectasis and/or infiltrate is seen within the left lung base. A small left pleural effusion is suspected. Asymmetric left breast soft tissue attenuation artifact is seen. Radiopaque surgical clips are also seen within the soft tissues of the left breast. Multilevel degenerative changes are seen throughout the thoracic spine. IMPRESSION: 1. Marked severity left basilar atelectasis and/or infiltrate. 2. Small left pleural effusion. Electronically Signed   By: Virgle Grime M.D.   On: 09/06/2023 23:09    Assessment: 83 y.o. female   Hypoxic resp failure, secondary to pneumonia A-fib with rapid ventricular response Present history of hip fracture, status post surgery on April 19 Metastatic breast cancer to skin, liver and bone, on fulvestrant  injection. Crohn's disease   Plan:  - Pneumonia and hypoxia management per primary team - She is overall improving, she was in sinus rhythm when I saw her - I discussed the overall poor prognosis with patient and her sister [name] again today.  Due to her recent hip fracture, now with pneumonia and hypoxia, her functional status is likely going to decline and she will need more care and assistance at home.  Patient and her sister did not want to go back to nursing home, her sister [name] will live with her at home and take care of her. - We discussed the goal of care is  palliative, if her functional status does not recover adequately, I do not recommend more chemotherapy.  Patient has been overall very reluctant to take medicine due to concern of side effects.  I would not offer chemotherapy. - We discussed palliative home care versus hospice, patient and her sister are not ready for hospice. - We also discussed CODE STATUS in detail, I recommend DNR.  Patient wants to think about further.  She is full code for now. - I will follow-up later this week, we will see her back 2 to 3 weeks after discharge.  She is due following vascular injection next week, I will likely postpone. - I spent a total of 50 minutes for her care today   Sonja Kirby, MD 09/07/2023  6:25 PM

## 2023-09-07 NOTE — Progress Notes (Signed)
 PHARMACY - ANTICOAGULATION CONSULT NOTE  Pharmacy Consult for Heparin  Indication: atrial fibrillation  Allergies  Allergen Reactions   Nsaids Other (See Comments)    Crohn's disease = NO NSAIDs   Codeine Nausea And Vomiting   Iodinated Contrast Media     Elevated BP,Itchy skin. Face red in color   Mesalamine  Nausea And Vomiting   Omnipaque  [Iohexol ] Itching    Itching and flushed/red face   Oxycodone  Itching and Rash   Albuterol      Atrial fibrillation RVR   Contrast Allergy Premed Pack [Prednisone  & Diphenhydramine ] Hives    Hives, rash, wheezing and shortness of breath   Lactose Intolerance (Gi) Diarrhea   Norco [Hydrocodone -Acetaminophen ] Nausea Only   Other Itching    PERFUMED LOTIONS  Cant have MRI due to ear implant    Sulfa Antibiotics Rash    Patient Measurements: Height: 5\' 6"  (167.6 cm) Weight: 78.9 kg (174 lb) IBW/kg (Calculated) : 59.3 HEPARIN  DW (KG): 75.6  Vital Signs: Temp: 98.6 F (37 C) (05/11 2256) Temp Source: Oral (05/11 2256) BP: 143/76 (05/12 0747) Pulse Rate: 103 (05/12 0747)  Labs: Recent Labs    09/06/23 2239 09/07/23 0640 09/07/23 0930  HGB 11.0* 10.5* 10.3*  HCT 34.2* 32.8* 32.6*  PLT 278 264 269  LABPROT 13.7  --   --   INR 1.0  --   --   HEPARINUNFRC  --   --  0.72*  CREATININE 0.80 0.83  --     Estimated Creatinine Clearance: 56.3 mL/min (by C-G formula based on SCr of 0.83 mg/dL).  Medications:  Lovenox  40mg  sq daily - LD on 5/9  Assessment: 83 yr female with AFib with RVR.  History of breast cancer.  Discharged 08/25/23 s/p repair of hip fracture.  Has been on Lovenox  40mg  sq daily for VTE prophylaxis CTAngio 5/12 negative for PE  09/07/2023 First heparin  level = 0.72 after heparin  bolus 3500 units IV x 1 and heparin  drip at 1100 units/hr is therapeutic CBC stable No bleeding reported  Goal of Therapy:  Heparin  level 0.3-0.7 units/ml Monitor platelets by anticoagulation protocol: Yes   Plan:  Continue Heparin   gtt @ 1100 units/hr Check confirmatory heparin  level in 8 hrs Daily heparil level & CBC Monitor for signs & symptoms of bleeding  Rubie Corona, Pharm.D Use secure chat for questions 09/07/2023 10:08 AM

## 2023-09-07 NOTE — Progress Notes (Signed)
 Patient placed on Bipap.  Patient and bedside visitor both informed of no food/drink while on machine.  Patient seems to be tolerating machine well at this time.

## 2023-09-07 NOTE — Progress Notes (Signed)
 2D echo attempted, patient being transferred to room. Will try later when patient is settled.

## 2023-09-07 NOTE — Plan of Care (Signed)
 Cardiology Plan of Care  Called by hospitalist. Patient currently at [facility]. Presented with respiratory symptoms and was treated for a pneumonia. Was found to be in Afib with RVR up to 160s. Noted to have increased O2 requirement up to 3L initially. CT PE was obtained and she was given pre-treatment for a contrast allergy, despite this she developed worsening shortness of breath, wheezing and she was started on BIPAP. CTA demonstrated patchy RUL infiltrate without PE. Patient was started on a heparin  gtt and diltiazem gtt. Patient currently altered. Prior TTE in 2022 with LVEF 60-65%.  Recommend --continue anticoagulation --continue cardizem gtt --ok for permissive tachycardia to 130s --obtain TTE, BNP --Cardiology to formally see in AM  Sara Barnes  09/07/23 4:51 AM

## 2023-09-07 NOTE — Progress Notes (Signed)
 Progress Note   Patient: Sara Barnes NWG:[id number] DOB: [birth year] DOA: 09/06/2023     0 DOS: the patient was seen and examined on 09/07/2023   Brief hospital course: 83yo with h/o Crohn's s/p ileostomy, metastatic breast CA, afib not on AC due to hepatic hematoma, anxiety/depression, and recent  hip repair currently in rehab who presented on 5/11 with SOB.   While in the ER, she experienced a contrast reaction, as well.  She is on BIPAP and was given steroids, benadryl , H2 blocker.  She was diagnosed with sepsis due to PNA and afib with RVR.    Assessment and Plan:   Sepsis secondary to pneumonia with respiratory distress Patient with recent hospitalization for hip fracture, presenting from SNF rehab with 2 days of URI and worsening respiratory failure  Lowest documented O2 sats are 92% but she was significantly SOB and placed on BIPAP Respiratory panel negative SIRS criteria with tachycardia, tachypnea; lactate of 4.1 indicating acute organ dysfunction CXR with marked severity L basilar atelectasis vs. Infiltrate CT with R/ L basilar atelectasis and small patchy R-sided infiltrates, likely multifocal PNA Also with midsternal lymphadenopathy concern for metastasis IV vancomycin  and cefepime given recent hospitalization BiPAP -> [place] O2 Continue nebulizers  Contrast-induced allergic reaction Premedicated prior to CT with Benadryl  and famotidine  but after the contrast load patient developed wheezing and was placed on BiPAP for respiratory distress Also received Solu-Medrol  125 mg with EMS before coming to the ED Received second dose of IV Solu-Medrol , Benadryl  and famotidine  in the ED.   After discussion with PCCM recommended to continue the BiPAP, continue IV Solu-Medrol  or Decadron , IV Benadryl  and IV famotidine  as scheduled  Unable to give epinephrine  shot given patient has A-fib RVR Continue IV Solu-Medrol  40 mg twice daily, iv Benadryl  25 mg 3 times daily and iv famotidine  20 mg  twice daily. Continue ipratropium every 6 hours scheduled Avoid albuterol  and levalbuterol  as it provokes A-fib RVR   A-fib with RVR with h/o paroxysmal atrial fibrillation Developed RVR after administration of SoluMedrol/Abluterol with EMS Per chart review, patient has history of paroxysmal afib (last in 2082 during hospitalization); family denies knowledge of this Not on anticoagulation due to history of hepatic hematoma in 2022  (resolved) Also not on rate controlling medications Started on IV heparin  drip and IV Cardizem drip Cardiology consulted Obtain echocardiogram    Acute metabolic encephalopathy Confusion on presentation in the setting of sepsis, respiratory, distress, and A-fib with RVR Continue delirium precautions Appears to have resolved, somnolent but appropriate this AM   History of hip fracture Resume oxycodone  Presented from [facility], there for rehab Appears likely to need to return to SNF rehab after dc  History of metastatic breast cancer to skin, liver and bone Metastatic breast cancer to skin, liver and bone CT C/A/P with new and old lytic lesions and concern for hilar/mediastinal LN mets Consulted inpatient palliative care to discuss further goal of care with patient and family Will also notify Dr. Maryalice Smaller of admission   Chronic hyponatremia Sodium level appears to be at/near baseline  Mood d/o Continue alprazolam , fluoxetine   Crohn's disease On daily prednisone  but this is on hold while on Solumedrol   Consultants: Oncology Cardiology Palliative care PCCM by telephone only  Procedures: Echocardiogram 5/12  Antibiotics: Azithromycin  x 1 Ceftriaxone x 1 Cefepime 5/12- Vancomycin  5/12-  30 Day Unplanned Readmission Risk Score    Flowsheet Row ED to Hosp-Admission (Current) from 09/06/2023 in [facility] Emergency Department at [facility]  30 Day Unplanned Readmission Risk Score (%) 24.78 Filed at 09/07/2023 0801       This  score is the patient's risk of an unplanned readmission within 30 days of being discharged (0 -100%). The score is based on dignosis, age, lab data, medications, orders, and past utilization.   Low:  0-14.9   Medium: 15-21.9   High: 22-29.9   Extreme: 30 and above           Subjective: Seen while on BIPAP.  Feeling some better.    Physical Exam: Vitals:   09/07/23 1035 09/07/23 1050 09/07/23 1150 09/07/23 1200  BP:  (!) 145/59  (!) 197/70  Pulse:  91  (!) 108  Resp:  17  (!) 21  Temp: 98 F (36.7 C)  98.1 F (36.7 C)   TempSrc: Oral  Oral   SpO2:  98%  96%  Weight:      Height:        Intake/Output Summary (Last 24 hours) at 09/07/2023 1357 Last data filed at 09/07/2023 1300 Gross per 24 hour  Intake 3878.64 ml  Output --  Net 3878.64 ml   Filed Weights   09/06/23 2227  Weight: 78.9 kg    Exam:  General:  Appears ill, on BIPAP, but in NAD Eyes:   normal lids, iris ENT:  BIPAP in place Cardiovascular:  Irregularly irregular with normal rate. No LE edema.  Respiratory:   CTA bilaterally with no wheezes/rales/rhonchi.  Mildly increased respiratory effort on BIPAP while awake but comfortable while sleeping. Skin:  no rash or induration seen on limited exam Musculoskeletal:  no bony abnormality Psychiatric: blunted mood and affect, speech limited by BIPAP but appears to be appropriate Neurologic:  no gross abnormalities on limited exam  Data Reviewed: I have reviewed the patient's lab results since admission.  Pertinent labs for today include:   Na++ 129 CO2 21 Glucose 166 Albumin  3.1 WBC 3.3 Hgb 10.5     Family Communication: Son, sister were present throughout evaluation  Disposition: Status is: Inpatient Remains inpatient appropriate because: critically ill  Planned Discharge Destination: Skilled nursing facility    Total critical care time: 55 minutes Critical care time was exclusive of separately billable procedures and treating other  patients. Critical care was necessary to treat or prevent imminent or life-threatening deterioration. Critical care was time spent personally by me on the following activities: development of treatment plan with patient and/or surrogate as well as nursing, discussions with consultants, evaluation of patient's response to treatment, examination of patient, obtaining history from patient or surrogate, ordering and performing treatments and interventions, ordering and review of laboratory studies, ordering and review of radiographic studies, pulse oximetry and re-evaluation of patient's condition.   Author: Lorita Rosa, MD 09/07/2023 1:57 PM  For on call review [email or web address].

## 2023-09-07 NOTE — Consult Note (Addendum)
 Cardiology Consultation   Patient ID: Sara Barnes MRN: [id number]; DOB: 06-18-1940  Admit date: 09/06/2023 Date of Consult: 09/07/2023  PCP:  Estill Hemming, DO   [facility] Providers Cardiologist:  Olinda Bertrand, DO new to Heart Care      Patient Profile:   Sara Barnes is a 83 y.o. female with a hx of metastatic breast cancer, Crohn's disease s/p ileostomy with reversal, PAF not on anticoagulation, and recent hip fracture s/p repair who is being seen 09/07/2023 for the evaluation of Afib RVR at the request of Dr. Murrel Arnt.  History of Present Illness:   Sara Barnes has a history of metastatic breast cancer and has followed with GI for ileostomy with subsequent closure related to Crohn's disease. Liver biopsy resulted in liver hematoma.  Breast cancer treated with fulvestrant . She carries a history of PAF not on anticoagulation due to this liver hematoma and patient choice given her CHA2DS2vasc score.   She was seen by cardiology in 2018 in the setting of/N/V/abdominal pain found to have bowel obstruction in the setting of known Crohn's disease.  She had converted to atrial fibrillation with RVR but reverted back to normal sinus rhythm with 1 dose of IV metoprolol .  She was discharged on low-dose metoprolol .  Anticoagulation was discussed with the patient for CHA2DS2-VASc of 2 for age and female sex.  The patient wanted to defer anticoagulation at that time to see if she had a recurrence.  She later stopped metoprolol .  She was startled by a spider and sustained a ground level fall breaking her hip 08/14/23. Fortunately, left intertrochanteric femur fracture was repaired and she was discharged to rehab.    She presented to [facility] 09/06/23 from [facility] for respiratory distress. She developed Afib with RVR. She has a history of PAF. She was started on cardizem gtt and heparin  gtt. she reports shortness of breath and cough for 2 days prior to admission.  Several other  residents have also been ill at her rehab facility.  Given history of metastatic breast cancer, CTA ordered to rule out PE. Despite pre-medication, she developed worsening SOB and wheezing resulting in BiPAP. CTA negative for PE, but did show patchy RUL infiltrate.   sCr 0.83 K 2.8 --> 3.8 Na 129 Albumin  3.1 BNP 532 Mg 1.6  Lactic acid 2.1 --> 4.1 Flu and covid negative  Code sepsis initiated. Cardiology consulted for Afib with RVR.  She is moderately rate controlled in the 90s to 110s on Cardizem drip.  She is also on heparin  drip.  Her sister is bedside and helps with history.  The patient is hard of hearing.  She is physically uncomfortable due to prior hip fracture and remaining on stretcher in the ER.  She reports intermittent heart racing, but does not recall her diagnosis of A-fib in the past.  She reports chest pressure with coughing. She and her sister deny history of bleeding. Liver hematoma occurred in 2022, no issues since.    Past Medical History:  Diagnosis Date   Allergy    Anemia    Anxiety    Arthritis    Basal cell carcinoma    Blood transfusion without reported diagnosis    Bowel perforation (HCC)    Cancer of lower-inner quadrant of female breast (HCC) 11/30/2014   left   Cancer of lower-inner quadrant of left female breast (HCC) 11/30/2014   Clostridium difficile infection    Complication of anesthesia    anxiety post-op after  Barnes surgeries   Crohn's disease of large intestine with abscess (HCC)    Dental crowns present    recent root canal 11/2014   Depression    Family history of adverse reaction to anesthesia    pt's sister has hx. 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   Seasonal allergies    TMJ (temporomandibular joint syndrome)    Ulcerative colitis    Vitamin B12 deficiency     Past Surgical History:  Procedure Laterality Date   ABDOMINAL HYSTERECTOMY     partial   APPENDECTOMY     AUGMENTATION MAMMAPLASTY Bilateral    BASAL CELL CARCINOMA EXCISION Left    nose   BREAST BIOPSY Left 2019   benign   BREAST ENHANCEMENT SURGERY     BREAST LUMPECTOMY Left 2016   BREAST LUMPECTOMY WITH RADIOACTIVE SEED AND SENTINEL LYMPH NODE BIOPSY Left 12/13/2014   Procedure: LEFT BREAST LUMPECTOMY WITH RADIOACTIVE SEED AND SENTINEL LYMPH NODE BIOPSY;  Surgeon: Lockie Rima, MD;  Location: [facility];  Service: General;  Laterality: Left;   COLONOSCOPY WITH PROPOFOL   05/28/2011; 06/28/2014   EXPLORATORY LAPAROTOMY  04/08/2018   with end ileostomy takedown and creation of loop ileostomy-- [facility]-Dr Aretta Beery   FLEXIBLE SIGMOIDOSCOPY N/A 01/08/2016   Procedure: Marlynn Singer;  Surgeon: Danette Duos, MD;  Location: [facility];  Service: Gastroenterology;  Laterality: N/A;  needs MAC due to pain and anxiety   ILEOSTOMY N/A 05/25/2017   Procedure: DILATION OF ILEOSTOMY;  Surgeon: Enid Harry, MD;  Location: [facility];  Service: General;  Laterality: N/A;   ILEOSTOMY  02/11/2016   END ileostomy.  Dr Gaylyn Keas   ILEOSTOMY CLOSURE N/A 06/02/2017   Procedure: RESECTION OF ILEAL STRICTURE; ILEOSTOMY REVISION ;  Surgeon: Candyce Champagne, MD;  Location: [facility];  Service: General;  Laterality: N/A;   ILEOSTOMY DILATION  05/25/2017   DILITATION OF ILEAL STRICTURE   ileostomy stricture resection  06/02/2017   with revision   INCISION AND DRAINAGE ABSCESS  2018   INCISION AND DRAINAGE PERIRECTAL ABSCESS N/A 09/28/2013   Procedure: IRRIGATION AND DEBRIDEMENT PERIANAL ABSCESS, proctoscopy;  Surgeon: Thayne Fine, MD;  Location: [facility];  Service: General;  Laterality: N/A;   INCISION AND DRAINAGE  PERIRECTAL ABSCESS N/A 01/22/2016   Procedure: IRRIGATION AND DEBRIDEMENT PERIRECTAL ABSCESS;  Surgeon: Oralee Billow, MD;  Location: [facility];  Service: General;  Laterality: N/A;   INNER Barnes SURGERY Bilateral    INTRAMEDULLARY (IM) NAIL INTERTROCHANTERIC Left 08/14/2023   Procedure: FIXATION, FRACTURE, INTERTROCHANTERIC, WITH INTRAMEDULLARY ROD;  Surgeon: Laneta Pintos, MD;  Location: [facility];  Service: Orthopedics;  Laterality: Left;   LAPAROSCOPIC DIVERTED COLOSTOMY N/A 02/11/2016   Procedure: LAPAROSCOPIC DIVERTED ILEOSTOMY;  Surgeon: Jacolyn Matar, MD;  Location: [facility];  Service: General;  Laterality: N/A;   LAPAROSCOPY N/A 03/23/2017   Procedure: LAPAROSCOPY DIAGNOSTIC, EXPLORATORY LAPAROTOMY,  BOWEL RESECTION;  Surgeon: Adalberto Hollow, MD;  Location: [facility];  Service: General;  Laterality: N/A;     Home Medications:  Prior to Admission medications   Medication Sig Start Date End Date Taking? Authorizing  Provider  acetaminophen  (TYLENOL ) 500 MG tablet You can take 2 tablets every 6 hours as needed for pain.  Use this as your primary pain control.  Use Tramadol  as a last resort.  You can buy this over the counter at any drug store.    DO NOT TAKE MORE THAN 4000 MG OF TYLENOL  PER DAY.  IT CAN HARM YOUR LIVER. Patient taking differently: Take 1,000 mg by mouth in the morning, at noon, and at bedtime. You can take 2 tablets every 6 hours as needed for pain.  Use this as your primary pain control.  Use Tramadol  as a last resort.  You can buy this over the counter at any drug store.    DO NOT TAKE MORE THAN 4000 MG OF TYLENOL  PER DAY.  IT CAN HARM YOUR LIVER. 03/31/17  Yes Monetta Angst, PA-C  ALPRAZolam  (XANAX ) 0.25 MG tablet Take 1 tablet (0.25 mg total) by mouth 2 (two) times daily as needed for anxiety. 08/25/23  Yes Samtani, Jai-Gurmukh, MD  cholecalciferol  (CHOLECALCIFEROL ) 25 MCG tablet Take 1 tablet (1,000 Units total) by mouth daily. 08/20/23  Yes McClung, Sarah A, PA-C  cyanocobalamin   (VITAMIN B12) 1000 MCG/ML injection INJECT 1ML INTO THE MUSCLE ONCE PER MONTH 03/27/22  Yes Pyrtle, Amber Bail, MD  diphenoxylate -atropine  (LOMOTIL ) 2.5-0.025 MG tablet TAKE 1 TO 2 TABLETS BY MOUTH THREE TO FOUR TIMES DAILY AS NEEDED FOR SEVERE DIARRHEA Patient taking differently: Take 1 tablet by mouth 3 (three) times daily as needed for diarrhea or loose stools. FOR SEVERE DIARRHEA 05/21/23  Yes Zehr, Jessica D, PA-C  enoxaparin  (LOVENOX ) 40 MG/0.4ML injection Inject 0.4 mLs (40 mg total) into the skin daily. 08/20/23 09/19/23 Yes McClung, Sarah A, PA-C  famotidine  (PEPCID ) 20 MG tablet Take 1 tablet (20 mg total) by mouth 2 (two) times daily. Patient taking differently: Take 20 mg by mouth 2 (two) times daily as needed for heartburn. 01/16/21  Yes Pyrtle, Amber Bail, MD  FLUoxetine  (PROZAC ) 20 MG capsule Take 1 capsule (20 mg total) by mouth daily. 08/25/23 09/24/23 Yes Samtani, Jai-Gurmukh, MD  fluticasone  (FLONASE ) 50 MCG/ACT nasal spray Place 2 sprays into both nostrils daily. 11/12/20  Yes Lowne Chase, Yvonne R, DO  guaiFENesin (MUCINEX) 600 MG 12 hr tablet Take 600 mg by mouth 2 (two) times daily.   Yes [provider]  lidocaine -prilocaine  (EMLA ) cream Apply 1 Application topically as needed (FOR INEJCTION PAIN). 05/18/23  Yes Sonja Cotati, MD  oxyCODONE  10 MG TABS Take 1 tablet (10 mg total) by mouth every 4 (four) hours. Patient taking differently: Take 10 mg by mouth as directed. Take 1 tablet (10 mg) TID & Take 1 tablet (10 mg) Daily PRN for Pain Mgt 08/25/23  Yes Samtani, Jai-Gurmukh, MD  predniSONE  (DELTASONE ) 5 MG tablet TAKE 1 TABLET BY MOUTH EVERY DAY 05/21/23  Yes Zehr, Jessica D, PA-C  zolpidem  (AMBIEN ) 10 MG tablet TAKE 1 TABLET(10 MG) BY MOUTH AT BEDTIME AS NEEDED FOR SLEEP 08/25/23  Yes Samtani, Jai-Gurmukh, MD    Inpatient Medications: Scheduled Meds:  Chlorhexidine  Gluconate Cloth  6 each Topical Daily   diphenhydrAMINE   25 mg Intravenous Q8H   ipratropium  0.5 mg Nebulization Q6H    methylPREDNISolone  (SOLU-MEDROL ) injection  40 mg Intravenous Q12H   oxyCODONE   10 mg Oral TID   sodium chloride  flush  3 mL Intravenous Q12H   sodium chloride  flush  3 mL Intravenous Q12H   Continuous Infusions:  sodium chloride      ceFEPime (MAXIPIME) IV  Stopped (09/07/23 0734)   diltiazem (CARDIZEM) infusion 10 mg/hr (09/07/23 0732)   famotidine  (PEPCID ) IV 20 mg (09/07/23 1057)   heparin  1,100 Units/hr (09/07/23 0733)   lactated ringers  125 mL/hr at 09/07/23 0631   magnesium  sulfate bolus IVPB 4 g (09/07/23 1048)   [START ON 09/08/2023] vancomycin      PRN Meds: sodium chloride , ALPRAZolam , sodium chloride  flush  Allergies:    Allergies  Allergen Reactions   Nsaids Other (See Comments)    Crohn's disease = NO NSAIDs   Codeine Nausea And Vomiting   Iodinated Contrast Media     Elevated BP,Itchy skin. Face red in color   Mesalamine  Nausea And Vomiting   Omnipaque  [Iohexol ] Itching    Itching and flushed/red face   Oxycodone  Itching and Rash   Albuterol      Atrial fibrillation RVR   Contrast Allergy Premed Pack [Prednisone  & Diphenhydramine ] Hives    Hives, rash, wheezing and shortness of breath   Lactose Intolerance (Gi) Diarrhea   Norco [Hydrocodone -Acetaminophen ] Nausea Only   Other Itching    PERFUMED LOTIONS  Cant have MRI due to Barnes implant    Sulfa Antibiotics Rash    Social History:   Social History   Socioeconomic History   Marital status: Widowed    Spouse name: Not on file   Number of children: 1   Years of education: Not on file   Highest education level: Not on file  Occupational History   Occupation: Dance movement psychotherapist    Comment: retired at age 36  Tobacco Use   Smoking status: Never   Smokeless tobacco: Never  Vaping Use   Vaping status: Never Used  Substance and Sexual Activity   Alcohol use: Not Currently    Alcohol/week: 7.0 standard drinks of alcohol    Types: 7 Glasses of wine per week    Comment: wine daily   Drug use: No   Sexual  activity: Not Currently    Partners: Male  Other Topics Concern   Not on file  Social History Narrative   Lives alone in own home   Right Handed    Drinks caffeine occasionally   Social Drivers of Health   Financial Resource Strain: Low Risk  (03/26/2021)   Overall Financial Resource Strain (CARDIA)    Difficulty of Paying Living Expenses: Not hard at all  Food Insecurity: No Food Insecurity (08/14/2023)   Hunger Vital Sign    Worried About Running Out of Food in the Last Year: Never true    Ran Out of Food in the Last Year: Never true  Transportation Needs: No Transportation Needs (08/14/2023)   PRAPARE - Administrator, Civil Service (Medical): No    Lack of Transportation (Non-Medical): No  Physical Activity: Sufficiently Active (03/26/2021)   Exercise Vital Sign    Days of Exercise per Week: 7 days    Minutes of Exercise per Session: 30 min  Stress: No Stress Concern Present (03/26/2021)   [employer] of Occupational Health - Occupational Stress Questionnaire    Feeling of Stress : Not at all  Social Connections: Moderately Isolated (08/14/2023)   Social Connection and Isolation Panel [NHANES]    Frequency of Communication with Friends and Family: More than three times a week    Frequency of Social Gatherings with Friends and Family: Once a week    Attends Religious Services: More than 4 times per year    Active Member of [employer] or Organizations: No    Attends [employer]  or Organization Meetings: Never    Marital Status: Widowed  Intimate Partner Violence: Not At Risk (08/14/2023)   Humiliation, Afraid, Rape, and Kick questionnaire    Fear of Current or Ex-Partner: No    Emotionally Abused: No    Physically Abused: No    Sexually Abused: No    Family History:    Family History  Problem Relation Age of Onset   Prostate cancer Brother 23   Kidney disease Brother    Diabetes Brother    Brain cancer Father    Rheum arthritis Mother    Anesthesia problems  Sister        post-op N/V   Emphysema Maternal Grandfather    Asthma Sister    Rheum arthritis Brother    Esophageal cancer Neg Hx    Breast cancer Neg Hx    Rectal cancer Neg Hx    Stomach cancer Neg Hx    Colon cancer Neg Hx      ROS:  Please see the history of present illness.   All other ROS reviewed and negative.     Physical Exam/Data:   Vitals:   09/07/23 0747 09/07/23 1035 09/07/23 1050 09/07/23 1150  BP: (!) 143/76  (!) 145/59   Pulse: (!) 103  91   Resp: (!) 23  17   Temp:  98 F (36.7 C)  98.1 F (36.7 C)  TempSrc:  Oral  Oral  SpO2: 100%  98%   Weight:      Height:        Intake/Output Summary (Last 24 hours) at 09/07/2023 1233 Last data filed at 09/07/2023 1040 Gross per 24 hour  Intake 3083.56 ml  Output --  Net 3083.56 ml      09/06/2023   10:27 PM 08/25/2023    5:00 AM 08/23/2023    4:54 AM  Last 3 Weights  Weight (lbs) 174 lb 174 lb 13.2 oz 175 lb 7.8 oz  Weight (kg) 78.926 kg 79.3 kg 79.6 kg     Body mass index is 28.08 kg/m.  General:  Well nourished, well developed, in pain from hip and position in bed HEENT: normal Neck: no JVD Vascular: No carotid bruits; Distal pulses 2+ bilaterally Cardiac:  irregular rhythm, tachycardic rate Lungs: rhonchi bilaterally, cough on exam with deep inspiration  Abd: soft, nontender, no hepatomegaly  Ext: no edema Musculoskeletal:  No deformities, BUE and BLE strength normal and equal Skin: warm and dry  Neuro:  CNs 2-12 intact, no focal abnormalities noted Psych:  Normal affect   EKG:  The EKG was personally reviewed and demonstrates:  atrial fibrillation with VR 159 Telemetry:  Telemetry was personally reviewed and demonstrates:  Afib with rates now in the 90-120s  Relevant CV Studies:  Echo once better rate controlled  Laboratory Data:  High Sensitivity Troponin:  No results for input(s): "TROPONINIHS" in the last 720 hours.   Chemistry Recent Labs  Lab 09/06/23 2239 09/07/23 0532  09/07/23 0640  NA 130*  --  129*  K 2.8*  --  3.8  CL 97*  --  98  CO2 23  --  21*  GLUCOSE 136*  --  166*  BUN 7*  --  7*  CREATININE 0.80  --  0.83  CALCIUM  8.7*  --  8.6*  MG  --  1.6*  --   GFRNONAA >60  --  >60  ANIONGAP 10  --  10    Recent Labs  Lab 09/06/23 2239 09/07/23 0640  PROT 6.5 6.3*  ALBUMIN  3.4* 3.1*  AST 16 24  ALT 11 14  ALKPHOS 142* 122  BILITOT 0.6 0.6   Lipids No results for input(s): "CHOL", "TRIG", "HDL", "LABVLDL", "LDLCALC", "CHOLHDL" in the last 168 hours.  Hematology Recent Labs  Lab 09/06/23 2239 09/07/23 0640 09/07/23 0930  WBC 6.0 3.3* 3.9*  RBC 3.54* 3.42* 3.34*  HGB 11.0* 10.5* 10.3*  HCT 34.2* 32.8* 32.6*  MCV 96.6 95.9 97.6  MCH 31.1 30.7 30.8  MCHC 32.2 32.0 31.6  RDW 11.9 11.9 11.9  PLT 278 264 269   Thyroid  No results for input(s): "TSH", "FREET4" in the last 168 hours.  BNP Recent Labs  Lab 09/07/23 0532  BNP 531.6*    DDimer No results for input(s): "DDIMER" in the last 168 hours.   Radiology/Studies:  CT Angio Chest PE W and/or Wo Contrast Result Date: 09/07/2023 CLINICAL DATA:  Suspected pulmonary embolism. EXAM: CT ANGIOGRAPHY CHEST WITH CONTRAST TECHNIQUE: Multidetector CT imaging of the chest was performed using the standard protocol during bolus administration of intravenous contrast. Multiplanar CT image reconstructions and MIPs were obtained to evaluate the vascular anatomy. RADIATION DOSE REDUCTION: This exam was performed according to the departmental dose-optimization program which includes automated exposure control, adjustment of the mA and/or kV according to patient size and/or use of iterative reconstruction technique. CONTRAST:  75mL OMNIPAQUE  IOHEXOL  350 MG/ML SOLN COMPARISON:  April 24, 2023 FINDINGS: Cardiovascular: There is mild calcification of the aortic arch, without evidence of aortic aneurysm. Satisfactory opacification of the pulmonary arteries to the segmental level. No evidence of pulmonary  embolism. Normal heart size. A small amount of pericardial fluid is noted. Mediastinum/Nodes: There is mild right hilar lymphadenopathy. A 9 mm anterior mediastinal lymph node is seen. This corresponds to an area of hypermetabolic activity seen on the prior nuclear medicine/PET scan (May 11, 2023). Thyroid  gland, trachea, and esophagus demonstrate no significant findings. Lungs/Pleura: Small patchy right apical and anterior right upper lobe infiltrates are seen. Mild left apical scarring and/or atelectasis is also noted. There is mild moderate severity bibasilar dependent atelectasis, left greater than right. There is a small right pleural effusion. A moderate size left pleural effusion is also seen. No pneumothorax is identified. Upper Abdomen: There is a small hiatal hernia. Musculoskeletal: Bilateral breast implants are seen. A 1.8 cm lytic lesion is seen within the posterior aspect of the T4 vertebral body on the left (axial CT image 35, CT series 5). This likely corresponds to the hypermetabolic focus seen within this region on the patient's prior nuclear medicine PET/CT (May 11, 2023). Mixed lytic and sclerotic areas are again seen involving the T8 and T10 vertebral bodies, consistent with healed metastatic disease. Anterior third and fourth left rib deformities are seen which corresponds to hypermetabolic areas seen on the prior nuclear medicine PET/CT. Multilevel degenerative changes are present throughout the thoracic spine. Review of the MIP images confirms the above findings. IMPRESSION: 1. No evidence of pulmonary embolism. 2. Small patchy right apical and anterior right upper lobe infiltrates. 3. Small right pleural effusion with a moderate size left pleural effusion. 4. Small, stable anterior mediastinal lymph node which corresponds to a hypermetabolic focus seen on the prior nuclear medicine PET/CT. 5. Findings consistent with osseous metastatic disease, as described above. 6. Small hiatal  hernia. 7. Aortic atherosclerosis. Electronically Signed   By: Virgle Grime M.D.   On: 09/07/2023 03:43   DG Chest Port 1 View Result Date: 09/06/2023 CLINICAL DATA:  Respiratory distress. EXAM:  PORTABLE CHEST 1 VIEW COMPARISON:  August 14, 2023 FINDINGS: The heart size and mediastinal contours are within normal limits. The lungs are hyperinflated. Marked severity atelectasis and/or infiltrate is seen within the left lung base. A small left pleural effusion is suspected. Asymmetric left breast soft tissue attenuation artifact is seen. Radiopaque surgical clips are also seen within the soft tissues of the left breast. Multilevel degenerative changes are seen throughout the thoracic spine. IMPRESSION: 1. Marked severity left basilar atelectasis and/or infiltrate. 2. Small left pleural effusion. Electronically Signed   By: Virgle Grime M.D.   On: 09/06/2023 23:09     Assessment and Plan:    Afib with RVR PAF - in the setting of respiratory failure with wheezing and nuo-nebs - currently running on cardizem gtt at 10 mg/hr - telemetry with rate control 90-120s - Mg 1.6 being replaced - will continue on cardizem gtt for now  - will continue rate controls strategy for now - RVR with PNA/sepsis and ongoing hip pain   Chronic anticoagulation - per notes, Afib in 2018 in the setting of bowel obstruction, has not had a recurrence, patient deferred OAC at that time - had liver hematoma in 2022 after liver biopsy - currently on heparin  gtt  - pt and sister deny prior GI bleeding   Acute respiratory failure PNA - required BiPAP, no on Cassandra - now on ABX   Hx of metastatic breast cancer with liver biopsy  - biopsy complicated by liver hematoma, resolved (2021) - mets to skin, liver, bone   Recent hip fracture s/p repair 08/14/23 - discharged to rehab 08/25/23 - still with pain    Risk Assessment/Risk Scores:     CHA2DS2-VASc Score = 4   This indicates a 4.8% annual risk of  stroke. The patient's score is based upon: CHF History: 0 HTN History: 1 Diabetes History: 0 Stroke History: 0 Vascular Disease History: 0 Age Score: 2 Gender Score: 1   For questions or updates, please contact [facility] Please consult [email or web address] for contact info under    Signed, Lamond Pilot, [place]  09/07/2023 11:43 AM  ADDENDUM:   Patient seen and examined with Lamond Pilot, PA .  I personally taken a history, examined the patient, reviewed relevant notes,  laboratory data / imaging studies.  I performed a substantive portion of this encounter and formulated the important aspects of the plan.  I agree with the APP's note, impression, and recommendations; however, I have edited the note to reflect changes or salient points.   Patient presents to the hospital from her SNF due to concerns for congestion, shortness of breath, and needing supplemental oxygen.  Cardiology consulted during this hospitalization for A-fib with RVR management.  Sister Sara Barnes at bedside who provides collateral history as part of today's encounter.  Patient denies anginal chest pain or heart failure symptoms. No prior history of intracranial or gastrointestinal bleeding according to the patient. No history of stroke.  Currently meeting treated for sepsis/pneumonia.  Remains on IV heparin  and Cardizem drips for thromboembolic prophylaxis and rate control strategy.  PHYSICAL EXAM: Today's Vitals   09/07/23 0747 09/07/23 1035 09/07/23 1050 09/07/23 1150  BP: (!) 143/76  (!) 145/59   Pulse: (!) 103  91   Resp: (!) 23  17   Temp:  98 F (36.7 C)  98.1 F (36.7 C)  TempSrc:  Oral  Oral  SpO2: 100%  98%   Weight:      Height:  Body mass index is 28.08 kg/m.   Net IO Since Admission: 3,083.56 mL [09/07/23 1233]  Filed Weights   09/06/23 2227  Weight: 78.9 kg    Physical Exam  Constitutional: No distress. She appears chronically ill.  hemodynamically stable   Neck: No JVD present.  Cardiovascular: S1 normal and S2 normal. An irregularly irregular rhythm present. Tachycardia present.  No murmurs rubs or gallops appreciated secondary to tachycardia  Pulmonary/Chest: No stridor. She has no wheezes. She has no rales.  Rhonchi noted bilateral. Left partial mastectomy  Musculoskeletal:        General: No edema.     Cervical back: Neck supple.  Neurological: She is alert and oriented to person, place, and time.  Skin: Skin is warm.  Ecchymosis    EKG: (personally reviewed by me) 09/06/2023 atrial fibrillation with rapid ventricular rate 159 bpm, ST depression in the inferolateral leads consider ischemia or rate related changes.  When compared to prior tracing May 02, 2021 atrial fibrillation has replaced normal sinus rhythm.  ST-T changes are also new.  Telemetry: (personally reviewed by me) Atrial fibrillation with controlled ventricular rate   Radiology: CT Abd & Pelvis w/o contrast  04/2021 Hepatobiliary: No gallstones or biliary dilatation is noted. 10.7 x 7.5 cm hemorrhage is again noted involving the posterior and inferior aspect of the left hepatic lobe which appears to be enlarged compared to prior exam  CT chest abdomen pelvis w/ contrast  08/2021 Hepatobiliary: Small residual and somewhat ill-defined hypoechoic lesions in both lobes of the liver dome, measuring up to 12 x 12 mm in the left hepatic lobe (2/51), previously 2.8 x 3.8 cm. Small residual minimally complex left hepatic lobe fluid collection, now measuring 3.5 x 3.7 cm, compared to 7.5 x 10.7 cm on 04/30/2021. Liver and gallbladder are otherwise unremarkable. No biliary ductal dilatation.  CT Chest Abdomen and Pelvis  02/2022 Hepatobiliary: Diminished size of multiple hypodense liver lesions, largest hyperdense cystic lesion in the posterior left lobe of the liver, hepatic segment III, residua of a previously seen hemorrhage measuring 2.3 x 2.0 cm, previously  3.7 x 3.5 cm (series 2, image 54). Additional index lesion of the superior anterior left lobe of the liver, hepatic segment II, measures 0.6 x 0.6 cm, previously 1.2 x 1.2 cm (series 2, image 49). No gallstones, gallbladder wall thickening, or biliary dilatation.  CT ABDOMEN AND PELVIS 07/2012  Hepatobiliary: Several small hypoenhancing lesions are again seen, largest in segment 2/3 measuring 2.2 x 1.6 cm. These show no significant change compared to previous study. No new or enlarging liver masses identified. Gallbladder is unremarkable. No evidence of biliary ductal dilatation.    Impression:  Atrial fibrillation with rapid ventricular rate Acute respiratory distress requiring supplemental oxygen. Sepsis secondary to pneumonia. Lactic acidosis. History of hip fracture. 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  Recommendations:  Atrial fibrillation with rapid ventricular rate: Ventricular rate improving. Rate control: Cardizem drip. Rhythm control: N/A. Thromboembolic prophylaxis: IV heparin  drip CHA2DS2-VASc score 4 Isolated episode of A-fib in 2018 treated with IV Lopressor  and due to low chads Vascor patient chose not to be on anticoagulation. Later had a liver biopsy and had hematoma as a procedural complication. Currently has been tolerating IV heparin  since arrival to the ED without any complications clinically. Given his chads Vascor she would benefit from anticoagulation to reduce her risk of thromboembolic event.  As she was quite independent prior to her recent hip fracture.  However this has  to be a shared decision and if patient and family would like to proceed with anticoagulation would recommend CT of the abdomen to rule out/obtain a baseline study with regards to her prior hematoma.  Patient and family to consider. Would focus on treating her underlying sepsis and pneumonia and this should improve her ventricular rate and possibly even restore  sinus rhythm. Echo pending Cardiology to follow.  At the request of the patient I did speak to her Arnita Laos.  Son is the next of kin.  They will discuss amongst themselves with regards to anticoagulation management going forward.  Given her clinical trajectory further shared decision making we will need to be carried out.  Palliative care also seeing the patient as well.  Acute respiratory distress requiring supplemental oxygen: Currently on antibiotics and supportive care.  Sepsis secondary to pneumonia: Presented with A-fib with RVR, tachypnea, elevated lactic acid. CT illustrates evidence of right upper lobe pneumonia, per report Management per primary team.  As part of this consultation reviewed history and physical, labs 09/06/2023, chest x-ray, multiple CT reports given her history of hematoma, EKG, prescription drug management, collateral history obtained by his sister Sara Barnes, coordination of care.  Further recommendations to follow as the case evolves.   This note was created using a voice recognition software as a result there may be grammatical errors inadvertently enclosed that do not reflect the nature of this encounter. Every attempt is made to correct such errors.   Awilda Bogus, [facility] [place]  [facility]  Pager: [phone] Office: [phone] 09/07/2023 12:33 PM

## 2023-09-07 NOTE — ED Notes (Signed)
 Rounded on pt. Verified all of her meds were running at the proper rate. Hung a new bag of K. Updated vitals. Family in room. Pt is resting chest is rising and falling. Pt opened her eyes when I adjusted her blood pressure cuff and nodded her head as I told her what I was doing.

## 2023-09-07 NOTE — Progress Notes (Signed)
 Pharmacy Antibiotic Note  Sara Barnes is a 83 y.o. female admitted on 09/06/2023 with sepsis/pneumonia.  Pharmacy has been consulted for Vancomycin  and Cefepime dosing.  Plan: Cefepime 2g IV q12h Vancomycin  1500mg  IV x 1 followed by Vancomycin  1250 mg IV Q 24 hrs. Goal AUC 400-550. Expected AUC: 465.7  SCr used: 0.8 Follow renal function F/u culture results & sensitivities  Height: 5\' 6"  (167.6 cm) Weight: 78.9 kg (174 lb) IBW/kg (Calculated) : 59.3  Temp (24hrs), Avg:98.6 F (37 C), Min:98.6 F (37 C), Max:98.6 F (37 C)  Recent Labs  Lab 09/06/23 2239 09/07/23 0407  WBC 6.0  --   CREATININE 0.80  --   LATICACIDVEN 2.1* 4.1*    Estimated Creatinine Clearance: 58.4 mL/min (by C-G formula based on SCr of 0.8 mg/dL).    Allergies  Allergen Reactions   Nsaids Other (See Comments)    Crohn's disease = NO NSAIDs   Codeine Nausea And Vomiting   Iodinated Contrast Media     Elevated BP,Itchy skin. Face red in color   Mesalamine  Nausea And Vomiting   Omnipaque  [Iohexol ] Itching    Itching and flushed/red face   Oxycodone  Itching and Rash   Contrast Allergy Premed Pack [Prednisone  & Diphenhydramine ] Hives    Hives, rash, wheezing and shortness of breath   Lactose Intolerance (Gi) Diarrhea   Norco [Hydrocodone -Acetaminophen ] Nausea Only   Other Itching    PERFUMED LOTIONS  Cant have MRI due to ear implant    Sulfa Antibiotics Rash    Antimicrobials this admission: 5/11 Ceftriaxone x 1 5/11 Azithromycin  x 1 5/12  Vancomycin  >>   5/12 Cefepime >>    Dose adjustments this admission:    Microbiology results: 5/11 BCx:      Thank you for allowing pharmacy to be a part of this patient's care.  Rulon Councilman, PharmD 09/07/2023 4:54 AM

## 2023-09-07 NOTE — Progress Notes (Addendum)
 PHARMACY - ANTICOAGULATION CONSULT NOTE  Pharmacy Consult for Heparin  Indication: atrial fibrillation  Allergies  Allergen Reactions   Nsaids Other (See Comments)    Crohn's disease = NO NSAIDs   Codeine Nausea And Vomiting   Iodinated Contrast Media     Elevated BP,Itchy skin. Face red in color   Mesalamine  Nausea And Vomiting   Omnipaque  [Iohexol ] Itching    Itching and flushed/red face   Oxycodone  Itching and Rash   Lactose Intolerance (Gi) Diarrhea   Norco [Hydrocodone -Acetaminophen ] Nausea Only   Other Itching    PERFUMED LOTIONS  Cant have MRI due to ear implant    Sulfa Antibiotics Rash    Patient Measurements: Height: 5\' 6"  (167.6 cm) Weight: 78.9 kg (174 lb) IBW/kg (Calculated) : 59.3 HEPARIN  DW (KG): 75.6  Vital Signs: Temp: 98.6 F (37 C) (05/11 2256) Temp Source: Oral (05/11 2256) BP: 140/78 (05/11 2330) Pulse Rate: 130 (05/11 2330)  Labs: Recent Labs    09/06/23 2239  HGB 11.0*  HCT 34.2*  PLT 278  LABPROT 13.7  INR 1.0  CREATININE 0.80    Estimated Creatinine Clearance: 58.4 mL/min (by C-G formula based on SCr of 0.8 mg/dL).   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   Seasonal allergies    TMJ (temporomandibular joint syndrome)    Ulcerative colitis    Vitamin B12 deficiency     Medications:  Lovenox  40mg  sq daily - LD on 5/9  Assessment: 83 yr female with AFib with RVR.  History of breast cancer.  Discharged 08/25/23 s/p repair of hip fracture.  Has been on Lovenox  40mg  sq daily for VTE prophylaxis CTAngio ordered to r/o PE given history of cancer  Goal of Therapy:  Heparin  level 0.3-0.7 units/ml Monitor platelets by anticoagulation protocol: Yes   Plan:  Heparin  3500 unit IV bolus x 1 Heparin  gtt @ 1100 units/hr Check heparin  level 8 hr after infusion started Daily heparil level & CBC Monitor for signs & symptoms of bleeding  Kendyll Huettner, Gabriel John, PharmD 09/07/2023,12:15 AM

## 2023-09-07 NOTE — Progress Notes (Signed)
 Pharmacy Brief Note - Evening Anticoagulation Follow Up:  Pt is an 55 yoF anticoagulated with heparin  drip for afib. For full history, see note by Arlyne Bering, PharmD from earlier today.   Assessment: Confirmatory heparin  level = 0.60 remains therapeutic on heparin  infusion of 1100 units/hr No bleeding reported  Goal: Heparin  level 0.3 - 0.7  Plan: Continue heparin  infusion at current rate of 1100 units/hr CBC, heparin  level daily Monitor for signs of bleeding  Shireen Dory, PharmD 09/07/23 2:22 PM

## 2023-09-07 NOTE — H&P (Signed)
 History and Physical    Sara Barnes:[id number] DOB: [birth year] DOA: 09/06/2023  PCP: Estill Hemming, DO   Patient coming from: SNF   Chief Complaint:  Chief Complaint  Patient presents with   Respiratory Distress   ED TRIAGE note:Pt from [facility] for respiratory distress. She is there for rehab from having hip repair 1 month ago. Pt was wheezing on EMS arrival.  HPI:  Sara Barnes is a 83 y.o. 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  During discharge from the hospital patient being placed on IV Lovenox  40 mg daily for DVT prophylaxis.    ED Course:  At presentation to ED patient found tachycardic heart rate 162, hypertensive, tachypneic and O2 sat 100% on 8 L oxygen.  Currently requiring BiPAP. ED ED O2 sat dropped to 80% on room air.  Initially placed on 3 L oxygen however requiring more oxygen so patient has been placed on BiPAP.   EKG showing atrial fibrillation RVR heart rate 159. CBC unremarkable stable H&H, normal WBC platelet count. CMP showing low sodium 130, low potassium 2.8, low bicarb 96, elevated AST 142. Normal pro time INR. Elevated lactic acid 2.1. Respiratory panel negative for COVID/RSV/flu. Blood culture in process. Pending BMP, mag, and repeat lactic acid.  Chest  x-ray showed left basilar infiltrate and atelectasis.  Small left pleural effusion.  CTA chest no evidence of PE.  Small patchy right apical and upper lobe infiltrate.  Small right pleural effusion.  Stable anterior mediastinal lymphadenopathy.  Finding consistent with osseous metastatic disease as described above.  Small hiatal hernia.  Aortic atherosclerosis.  In the ED patient has been started on Cardizem drip and IV heparin  drip.  For the management of sepsis patient received 2 L of LR bolus, azithromycin  and ceftriaxone.  Dr. Kermit Ped reported that even though patient has been pretreated with Benadryl  and  Of note, chart review previous CT abdomen pelvis from 01/16/2023 no evidence of hepatic hematoma anymore stable hepatic and osseous metastatic disease progression.  Given patient has allergic reaction with IV contrast discussed case with PCCM Dr. Charon Copper who recommended to give IV Decadron /Solu-Medrol , continue IV Benadryl  and IV famotidine .  History of allergic reaction/hives with prednisone .  Discussed with PCCM reviewed and clarified that it is not uncommon that patient is allergic to prednisone  however it is safe to give Decadron  or Solu-Medrol . Also unable to give epinephrine  shot in the setting of A-fib RVR currently on Cardizem drip.  Discussed case with cardiology Dr.Bandarage who recommended continue the Cardizem drip and heparin  drip.  Cardiology will see patient in the daytime.  Hospitalist has been consulted for further management of allergic reaction with contrast, A-fib RVR, sepsis due to pneumonia, acute metabolic encephalopathy and acute hypoxic respiratory failure.    Significant labs in the ED: Lab Orders         Blood Culture (routine x 2)         Resp  panel by RT-PCR (RSV, Flu A&B, Covid) Anterior Nasal Swab         Expectorated Sputum Assessment w Gram Stain, Rflx to Resp Cult         Comprehensive metabolic panel         CBC with Differential         Protime-INR          Magnesium          Brain natriuretic peptide         Heparin  level (unfractionated)         CBC         Blood gas, arterial         Blood gas, arterial         Legionella Pneumophila Serogp 1 Ur Ag         Strep pneumoniae urinary antigen         Comprehensive metabolic panel         CBC         Lactic acid, plasma         TSH       Review of Systems:  Review of Systems  Unable to perform ROS: Acuity of condition    Past Medical History:  Diagnosis Date   Allergy    Anemia    Anxiety    Arthritis    Basal cell carcinoma    Blood transfusion without reported diagnosis    Bowel perforation (HCC)    Cancer of lower-inner quadrant of female breast (HCC) 11/30/2014   left   Cancer of lower-inner quadrant of left female breast (HCC) 11/30/2014   Clostridium difficile infection    Complication of anesthesia    anxiety post-op after ear surgeries   Crohn's disease of large intestine with abscess (HCC)    Dental crowns present    recent root canal 11/2014   Depression    Family history of adverse reaction to anesthesia    pt's sister has hx. 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   Seasonal allergies    TMJ (temporomandibular joint syndrome)    Ulcerative colitis    Vitamin B12 deficiency     Past Surgical History:  Procedure Laterality Date   ABDOMINAL HYSTERECTOMY     partial   APPENDECTOMY     AUGMENTATION MAMMAPLASTY Bilateral    BASAL CELL CARCINOMA EXCISION Left    nose   BREAST BIOPSY Left 2019   benign   BREAST ENHANCEMENT SURGERY     BREAST LUMPECTOMY Left 2016   BREAST LUMPECTOMY WITH RADIOACTIVE SEED AND SENTINEL LYMPH NODE BIOPSY Left 12/13/2014    Procedure: LEFT BREAST LUMPECTOMY WITH RADIOACTIVE SEED AND SENTINEL LYMPH NODE BIOPSY;  Surgeon: Lockie Rima, MD;  Location: [facility];  Service: General;  Laterality: Left;   COLONOSCOPY WITH PROPOFOL   05/28/2011; 06/28/2014   EXPLORATORY LAPAROTOMY  04/08/2018   with end ileostomy takedown and creation of loop ileostomy-- [facility]-Dr Aretta Beery   FLEXIBLE SIGMOIDOSCOPY N/A 01/08/2016   Procedure: Marlynn Singer;  Surgeon: Danette Duos, MD;  Location: [facility];  Service: Gastroenterology;  Laterality: N/A;  needs MAC due to pain and anxiety   ILEOSTOMY N/A 05/25/2017   Procedure: DILATION OF ILEOSTOMY;  Surgeon: Enid Harry, MD;  Location: [facility];  Service: General;  Laterality: N/A;   ILEOSTOMY  02/11/2016   END ileostomy.  Dr Gaylyn Keas   ILEOSTOMY CLOSURE N/A 06/02/2017   Procedure: RESECTION OF ILEAL STRICTURE; ILEOSTOMY REVISION ;  Surgeon: Candyce Champagne, MD;  Location: [facility];  Service: General;  Laterality: N/A;   ILEOSTOMY DILATION  05/25/2017   DILITATION OF ILEAL STRICTURE   ileostomy stricture resection  06/02/2017   with revision   INCISION AND DRAINAGE ABSCESS  2018   INCISION AND DRAINAGE PERIRECTAL ABSCESS N/A 09/28/2013   Procedure: IRRIGATION AND DEBRIDEMENT PERIANAL ABSCESS, proctoscopy;  Surgeon: Thayne Fine, MD;  Location: [facility];  Service: General;  Laterality: N/A;   INCISION AND DRAINAGE PERIRECTAL ABSCESS N/A 01/22/2016   Procedure: IRRIGATION AND DEBRIDEMENT PERIRECTAL ABSCESS;  Surgeon: Oralee Billow, MD;  Location: [facility];  Service: General;  Laterality: N/A;   INNER EAR SURGERY Bilateral    INTRAMEDULLARY (IM) NAIL INTERTROCHANTERIC Left 08/14/2023   Procedure: FIXATION, FRACTURE, INTERTROCHANTERIC, WITH INTRAMEDULLARY ROD;  Surgeon: Laneta Pintos, MD;  Location: [facility];  Service: Orthopedics;  Laterality: Left;   LAPAROSCOPIC DIVERTED COLOSTOMY N/A 02/11/2016   Procedure: LAPAROSCOPIC DIVERTED  ILEOSTOMY;  Surgeon: Jacolyn Matar, MD;  Location: [facility];  Service: General;  Laterality: N/A;   LAPAROSCOPY N/A 03/23/2017   Procedure: LAPAROSCOPY DIAGNOSTIC, EXPLORATORY LAPAROTOMY,  BOWEL RESECTION;  Surgeon: Adalberto Hollow, MD;  Location: [facility];  Service: General;  Laterality: N/A;     reports that she has never smoked. She has never used smokeless tobacco. She reports that she does not currently use alcohol after a past usage of about 7.0 standard drinks of alcohol per week. She reports that she does not use drugs.  Allergies  Allergen Reactions   Nsaids Other (See Comments)    Crohn's disease = NO NSAIDs   Codeine Nausea And Vomiting   Iodinated Contrast Media     Elevated BP,Itchy skin. Face red in color   Mesalamine  Nausea And Vomiting   Omnipaque  [Iohexol ] Itching    Itching and flushed/red face   Oxycodone  Itching and Rash   Albuterol      Atrial fibrillation RVR   Contrast Allergy Premed Pack [Prednisone  & Diphenhydramine ] Hives    Hives, rash, wheezing and shortness of breath   Lactose Intolerance (Gi) Diarrhea   Norco [Hydrocodone -Acetaminophen ] Nausea Only   Other Itching    PERFUMED LOTIONS  Cant have MRI due to ear implant    Sulfa Antibiotics Rash    Family History  Problem Relation Age of Onset   Prostate cancer Brother 42   Kidney disease Brother    Diabetes Brother    Brain cancer Father    Rheum arthritis Mother    Anesthesia problems Sister        post-op N/V   Emphysema Maternal Grandfather    Asthma Sister    Rheum arthritis Brother    Esophageal cancer Neg Hx    Breast cancer Neg Hx    Rectal cancer Neg Hx    Stomach cancer Neg Hx    Colon cancer Neg Hx     Prior to Admission medications   Medication Sig Start Date End Date Taking? Authorizing Provider  acetaminophen  (TYLENOL ) 500 MG tablet You can take 2 tablets every 6 hours as needed for pain.  Use this as your primary pain  control.  Use Tramadol  as a last resort.  You can buy this  over the counter at any drug store.    DO NOT TAKE MORE THAN 4000 MG OF TYLENOL  PER DAY.  IT CAN HARM YOUR LIVER. Patient taking differently: Take 1,000 mg by mouth in the morning, at noon, and at bedtime. You can take 2 tablets every 6 hours as needed for pain.  Use this as your primary pain control.  Use Tramadol  as a last resort.  You can buy this over the counter at any drug store.    DO NOT TAKE MORE THAN 4000 MG OF TYLENOL  PER DAY.  IT CAN HARM YOUR LIVER. 03/31/17  Yes Monetta Angst, PA-C  ALPRAZolam  (XANAX ) 0.25 MG tablet Take 1 tablet (0.25 mg total) by mouth 2 (two) times daily as needed for anxiety. 08/25/23  Yes Samtani, Jai-Gurmukh, MD  cholecalciferol  (CHOLECALCIFEROL ) 25 MCG tablet Take 1 tablet (1,000 Units total) by mouth daily. 08/20/23  Yes McClung, Sarah A, PA-C  cyanocobalamin  (VITAMIN B12) 1000 MCG/ML injection INJECT 1ML INTO THE MUSCLE ONCE PER MONTH 03/27/22  Yes Pyrtle, Amber Bail, MD  diphenoxylate -atropine  (LOMOTIL ) 2.5-0.025 MG tablet TAKE 1 TO 2 TABLETS BY MOUTH THREE TO FOUR TIMES DAILY AS NEEDED FOR SEVERE DIARRHEA Patient taking differently: Take 1 tablet by mouth 3 (three) times daily as needed for diarrhea or loose stools. FOR SEVERE DIARRHEA 05/21/23  Yes Zehr, Jessica D, PA-C  enoxaparin  (LOVENOX ) 40 MG/0.4ML injection Inject 0.4 mLs (40 mg total) into the skin daily. 08/20/23 09/19/23 Yes McClung, Genevive Ket, PA-C  famotidine  (PEPCID ) 20 MG tablet Take 1 tablet (20 mg total) by mouth 2 (two) times daily. Patient taking differently: Take 20 mg by mouth 2 (two) times daily as needed for heartburn. 01/16/21  Yes Pyrtle, Amber Bail, MD  FLUoxetine  (PROZAC ) 20 MG capsule Take 1 capsule (20 mg total) by mouth daily. 08/25/23 09/24/23 Yes Samtani, Jai-Gurmukh, MD  fluticasone  (FLONASE ) 50 MCG/ACT nasal spray Place 2 sprays into both nostrils daily. 11/12/20  Yes Lowne Chase, Yvonne R, DO  guaiFENesin (MUCINEX) 600 MG 12 hr tablet Take 600 mg by mouth 2 (two) times daily.   Yes [provider]  lidocaine -prilocaine  (EMLA ) cream Apply 1 Application topically as needed (FOR INEJCTION PAIN). 05/18/23  Yes Sonja Pattonsburg, MD  oxyCODONE  10 MG TABS Take 1 tablet (10 mg total) by mouth every 4 (four) hours. Patient taking differently: Take 10 mg by mouth as directed. Take 1 tablet (10 mg) TID & Take 1 tablet (10 mg) Daily PRN for Pain Mgt 08/25/23  Yes Samtani, Jai-Gurmukh, MD  predniSONE  (DELTASONE ) 5 MG tablet TAKE 1 TABLET BY MOUTH EVERY DAY 05/21/23  Yes Zehr, Jessica D, PA-C  zolpidem  (AMBIEN ) 10 MG tablet TAKE 1 TABLET(10 MG) BY MOUTH AT BEDTIME AS NEEDED FOR SLEEP 08/25/23  Yes Verlie Glisson, MD     Physical Exam: Vitals:   09/07/23 0145 09/07/23 0326 09/07/23 0345 09/07/23 0430  BP:   132/64 (!) 129/97  Pulse: (!) 115 (!) 106 (!) 113 (!) 111  Resp: (!) 26 (!) 28 (!) 32 (!) 21  Temp:      TempSrc:      SpO2: 96%  100% 100%  Weight:      Height:        Physical Exam Constitutional:      General: She is not in acute distress.    Appearance: She is ill-appearing and toxic-appearing.  HENT:     Mouth/Throat:  Mouth: Mucous membranes are dry.  Cardiovascular:     Rate and Rhythm: Tachycardia present. Rhythm irregular.     Pulses: Normal pulses.  Pulmonary:     Effort: No respiratory distress.     Breath sounds: No stridor. Wheezing present. No rhonchi.  Musculoskeletal:     Cervical back: Neck supple.     Right lower leg: No edema.     Left lower leg: No edema.  Skin:    General: Skin is dry.     Capillary Refill: Capillary refill takes less than 2 seconds.  Neurological:     Mental Status: She is alert. She is disoriented.      Labs on Admission: I have personally reviewed following labs and imaging studies  CBC: Recent Labs  Lab 09/06/23 2239  WBC 6.0  NEUTROABS 3.7  HGB 11.0*  HCT 34.2*  MCV 96.6  PLT 278   Basic Metabolic Panel: Recent Labs  Lab 09/06/23 2239 09/07/23 0532  NA 130*  --   K 2.8*  --   CL 97*  --   CO2 23  --    GLUCOSE 136*  --   BUN 7*  --   CREATININE 0.80  --   CALCIUM  8.7*  --   MG  --  1.6*   GFR: Estimated Creatinine Clearance: 58.4 mL/min (by C-G formula based on SCr of 0.8 mg/dL). Liver Function Tests: Recent Labs  Lab 09/06/23 2239  AST 16  ALT 11  ALKPHOS 142*  BILITOT 0.6  PROT 6.5  ALBUMIN  3.4*   No results for input(s): "LIPASE", "AMYLASE" in the last 168 hours. No results for input(s): "AMMONIA" in the last 168 hours. Coagulation Profile: Recent Labs  Lab 09/06/23 2239  INR 1.0   Cardiac Enzymes: No results for input(s): "CKTOTAL", "CKMB", "CKMBINDEX", "TROPONINI", "TROPONINIHS" in the last 168 hours. BNP (last 3 results) Recent Labs    09/07/23 0532  BNP 531.6*   HbA1C: No results for input(s): "HGBA1C" in the last 72 hours. CBG: No results for input(s): "GLUCAP" in the last 168 hours. Lipid Profile: No results for input(s): "CHOL", "HDL", "LDLCALC", "TRIG", "CHOLHDL", "LDLDIRECT" in the last 72 hours. Thyroid  Function Tests: No results for input(s): "TSH", "T4TOTAL", "FREET4", "T3FREE", "THYROIDAB" in the last 72 hours. Anemia Panel: No results for input(s): "VITAMINB12", "FOLATE", "FERRITIN", "TIBC", "IRON", "RETICCTPCT" in the last 72 hours. Urine analysis:    Component Value Date/Time   COLORURINE YELLOW 04/26/2021 1232   APPEARANCEUR CLEAR 04/26/2021 1232   LABSPEC 1.014 04/26/2021 1232   PHURINE 5.0 04/26/2021 1232   GLUCOSEU NEGATIVE 04/26/2021 1232   GLUCOSEU NEGATIVE 01/17/2020 1033   HGBUR MODERATE (A) 04/26/2021 1232   HGBUR small 12/13/2009 0904   BILIRUBINUR Negative 03/28/2022 1513   KETONESUR NEGATIVE 04/26/2021 1232   PROTEINUR Negative 03/28/2022 1513   PROTEINUR NEGATIVE 04/26/2021 1232   UROBILINOGEN 0.2 03/28/2022 1513   UROBILINOGEN 0.2 01/17/2020 1033   NITRITE positive 03/28/2022 1513   NITRITE NEGATIVE 04/26/2021 1232   LEUKOCYTESUR Trace (A) 03/28/2022 1513   LEUKOCYTESUR NEGATIVE 04/26/2021 1232    Radiological  Exams on Admission: I have personally reviewed images CT Angio Chest PE W and/or Wo Contrast Result Date: 09/07/2023 CLINICAL DATA:  Suspected pulmonary embolism. EXAM: CT ANGIOGRAPHY CHEST WITH CONTRAST TECHNIQUE: Multidetector CT imaging of the chest was performed using the standard protocol during bolus administration of intravenous contrast. Multiplanar CT image reconstructions and MIPs were obtained to evaluate the vascular anatomy. RADIATION DOSE REDUCTION: This exam was performed  according to the departmental dose-optimization program which includes automated exposure control, adjustment of the mA and/or kV according to patient size and/or use of iterative reconstruction technique. CONTRAST:  75mL OMNIPAQUE  IOHEXOL  350 MG/ML SOLN COMPARISON:  April 24, 2023 FINDINGS: Cardiovascular: There is mild calcification of the aortic arch, without evidence of aortic aneurysm. Satisfactory opacification of the pulmonary arteries to the segmental level. No evidence of pulmonary embolism. Normal heart size. A small amount of pericardial fluid is noted. Mediastinum/Nodes: There is mild right hilar lymphadenopathy. A 9 mm anterior mediastinal lymph node is seen. This corresponds to an area of hypermetabolic activity seen on the prior nuclear medicine/PET scan (May 11, 2023). Thyroid  gland, trachea, and esophagus demonstrate no significant findings. Lungs/Pleura: Small patchy right apical and anterior right upper lobe infiltrates are seen. Mild left apical scarring and/or atelectasis is also noted. There is mild moderate severity bibasilar dependent atelectasis, left greater than right. There is a small right pleural effusion. A moderate size left pleural effusion is also seen. No pneumothorax is identified. Upper Abdomen: There is a small hiatal hernia. Musculoskeletal: Bilateral breast implants are seen. A 1.8 cm lytic lesion is seen within the posterior aspect of the T4 vertebral body on the left (axial CT image  35, CT series 5). This likely corresponds to the hypermetabolic focus seen within this region on the patient's prior nuclear medicine PET/CT (May 11, 2023). Mixed lytic and sclerotic areas are again seen involving the T8 and T10 vertebral bodies, consistent with healed metastatic disease. Anterior third and fourth left rib deformities are seen which corresponds to hypermetabolic areas seen on the prior nuclear medicine PET/CT. Multilevel degenerative changes are present throughout the thoracic spine. Review of the MIP images confirms the above findings. IMPRESSION: 1. No evidence of pulmonary embolism. 2. Small patchy right apical and anterior right upper lobe infiltrates. 3. Small right pleural effusion with a moderate size left pleural effusion. 4. Small, stable anterior mediastinal lymph node which corresponds to a hypermetabolic focus seen on the prior nuclear medicine PET/CT. 5. Findings consistent with osseous metastatic disease, as described above. 6. Small hiatal hernia. 7. Aortic atherosclerosis. Electronically Signed   By: Virgle Grime M.D.   On: 09/07/2023 03:43   DG Chest Port 1 View Result Date: 09/06/2023 CLINICAL DATA:  Respiratory distress. EXAM: PORTABLE CHEST 1 VIEW COMPARISON:  August 14, 2023 FINDINGS: The heart size and mediastinal contours are within normal limits. The lungs are hyperinflated. Marked severity atelectasis and/or infiltrate is seen within the left lung base. A small left pleural effusion is suspected. Asymmetric left breast soft tissue attenuation artifact is seen. Radiopaque surgical clips are also seen within the soft tissues of the left breast. Multilevel degenerative changes are seen throughout the thoracic spine. IMPRESSION: 1. Marked severity left basilar atelectasis and/or infiltrate. 2. Small left pleural effusion. Electronically Signed   By: Virgle Grime M.D.   On: 09/06/2023 23:09     EKG: My personal interpretation of EKG shows: A-fib RVR heart rate  159.    Assessment/Plan: Principal Problem:   Respiratory distress Active Problems:   Paroxysmal atrial fibrillation with RVR (HCC)   Allergic reaction to contrast   History of Crohn's disease   Sepsis due to pneumonia (HCC)   Hypokalemia   Hyponatremia   Breast cancer (HCC)   Acute metabolic encephalopathy   Acute hypoxic respiratory failure (HCC)   History of hip fracture   CKD (chronic kidney disease), stage II    Assessment and Plan: Respiratory distress  and Acute hypoxic respiratory failure Contrast-induced allergic reaction -Patient has been present emergency department from nursing home due to respiratory distress and new oxygen requirement.  Unable to get much history from the patient as patient is on BiPAP and anxious. And route to ED EMS given albuterol  15 mg, Solu-Medrol  125 mg Atrovent .  After giving the albuterol  nebulizer patient went into A-fib RVR at presentation to ED. - Initially patient was on 8 L oxygen however after the contrast load with CT scan patient started developing wheezing and placed on BiPAP for respiratory distress. -Pending ABG.  Respiratory panel negative for COVID/RSV/flu - Chest x-ray showing left basilar infiltrate and atelectasis.  Small left pleural effusion. -CTA chest no evidence of PE.  Right lobe infiltrate concern for pneumonia.  Moderate bilateral pleural effusion.  Midsternal lymphadenopathy concern for metastasis. -Fortunately patient's blood pressure within good range. - In the ED patient has been pretreated with Benadryl  and famotidine  and already received Solu-Medrol  125 mg before coming to the ED still after contrast exposure patient developed itchiness and wheezing.  Received second dose of IV Solu-Medrol , Benadryl  and famotidine  in the ED.   -After discussion with PCCM recommended to continue the BiPAP, continue IV Solu-Medrol  or Decadron , IV Benadryl  and IV famotidine  as scheduled. Unable to give epinephrine  shot given patient has  A-fib RVR. -Continue IV Solu-Medrol  40 mg twice daily, iv Benadryl  25 mg 3 times daily and iv famotidine  20 mg twice daily. -Continue ipratropium every 6 hours scheduled.  Avoid albuterol  and levalbuterol  as it provokes A-fib RVR. -Per discussion with patient's sister at the bedside patient is full code.   Sepsis secondary to pneumonia -A-fib RVR, tachypneic and elevated lactic acid.  Chest x-ray and CT chest showed evidence of pneumonia. - Respiratory panel negative.  Pending blood culture, sputum culture, urine Legionella, urine strep with urine strep antigen test. - In the ED patient has been resuscitated with 2 L of LR bolus. - Lactic acid is trending up to 4.1 giving 3rd liter of LR bolus and continue LR 125 cc/h. -In the setting of recent hospitalization risk for MRSA and Pseudomonas infection.   -Starting broad-spectrum coverage with IV vancomycin  and IV cefepime pharmacy consult. Continue BiPAP.  Continue nebulizer. -Patient is allergic to Tylenol .  A-fib RVR History of paroxysmal atrial fibrillation -A-fib RVR in the setting of respiratory distress, pneumonia and albuterol  use.   -Per chart review patient has history of paroxysmal afib however previously not on anticoagulation due to history of hepatic hematoma in 2022 and repeat CT scan showed that it has been resolved. Als at home patient is not any rate control medication and anticoagulation - Continue IV heparin  drip and IV Cardizem drip - Consult cardiology overnight recommended continue Cardizem drip and will see patient in the daytime for further recommendation. -Elevated BNP 531. -Checking TSH -Obtain echocardiogram and continue cardiac monitoring.   Acute metabolic encephalopathy -Confusion in the setting of acute hypoxic respiratory failure, sepsis and A-fib RVR. - Continue delirium precaution - Continue neurocheck every 4 hours while on BiPAP. -Gradually will wean down to high flow oxygen per patient  tolerance.  History of hip fracture -Holding pain medication in the setting of respiratory distress.  CKD stage II -Stable renal function.  Continue to monitor.  History of metastatic breast cancer to skin, liver and bone -Metastatic breast cancer to skin, liver and bone.  However CT chest showed possible metastasis to lung as well. - Consulting inpatient palliative care to discuss further goal of care  with patient and family  Hypokalemia -Low potassium 2.8.  Pending mag level - In the ED patient has been treated with potassium chloride  10 mEq x 6 doses.   Chronic hyponatremia - Low sodium 130.  Sodium level at baseline.  Continue to monitor  DVT prophylaxis: Currently on IV heparin  drip. Code Status:  Full code per verification with patient's sister at the bedside Diet: N.p.o. as on BiPAP Family Communication:   Family was present at bedside, at the time of interview. Opportunity was given to ask question and all questions were answered satisfactorily.  Disposition Plan: Continue monitor improvement of respiratory status and eventually can transition BiPAP to high flow/nasal cannula oxygen Consults: PCCM and cardiology Admission status:   Inpatient, Step Down Unit  Severity of Illness: The appropriate patient status for this patient is INPATIENT. Inpatient status is judged to be reasonable and necessary in order to provide the required intensity of service to ensure the patient's safety. The patient's presenting symptoms, physical exam findings, and initial radiographic and laboratory data in the context of their chronic comorbidities is felt to place them at high risk for further clinical deterioration. Furthermore, it is not anticipated that the patient will be medically stable for discharge from the hospital within 2 midnights of admission.   * I certify that at the point of admission it is my clinical judgment that the patient will require inpatient hospital care spanning beyond 2  midnights from the point of admission due to high intensity of service, high risk for further deterioration and high frequency of surveillance required.Aaron Aas    Monea Pesantez, MD [facility]  How to contact the [facility] Attending or Consulting provider 7A - 7P or covering provider during after hours 7P -7A, for this patient.  Check the care team in [facility] and look for a) attending/consulting [facility] provider listed and b) the [facility] team listed Log into [email or web address] and use [place]'s universal password to access. If you do not have the password, please contact the hospital operator. Locate the [facility] provider you are looking for under [facility] and page to a number that you can be directly reached. If you still have difficulty reaching the provider, please page the [facility] (Director on Call) for the Hospitalists listed on amion for assistance.  09/07/2023, 6:31 AM

## 2023-09-07 NOTE — Progress Notes (Signed)
 Peripherally Inserted Central Catheter Placement  The IV Nurse has discussed with the patient and/or persons authorized to consent for the patient, the purpose of this procedure and the potential benefits and risks involved with this procedure.  The benefits include less needle sticks, lab draws from the catheter, and the patient may be discharged home with the catheter. Risks include, but not limited to, infection, bleeding, blood clot (thrombus formation), and puncture of an artery; nerve damage and irregular heartbeat and possibility to perform a PICC exchange if needed/ordered by physician.  Alternatives to this procedure were also discussed.  Bard Power PICC patient education guide, fact sheet on infection prevention and patient information card has been provided to patient /or left at bedside.    PICC Placement Documentation  PICC Triple Lumen 09/07/23 Right Basilic 37 cm 0 cm (Active)  Indication for Insertion or Continuance of Line Vasoactive infusions 09/07/23 1641  Exposed Catheter (cm) 0 cm 09/07/23 1641  Site Assessment Clean, Dry, Intact 09/07/23 1641  Lumen #1 Status Flushed;Blood return noted 09/07/23 1641  Lumen #2 Status Flushed;Saline locked;Blood return noted 09/07/23 1641  Lumen #3 Status Flushed;Saline locked;Blood return noted 09/07/23 1641  Dressing Type Securing device;Transparent 09/07/23 1641  Dressing Status Antimicrobial disc/dressing in place;Clean, Dry, Intact 09/07/23 1641  Line Care Connections checked and tightened 09/07/23 1641  Line Adjustment (NICU/IV Team Only) No 09/07/23 1641  Dressing Intervention New dressing;Adhesive placed at insertion site (IV team only) 09/07/23 1641  Dressing Change Due 09/14/23 09/07/23 1641       Roddy Citizen Albarece 09/07/2023, 4:42 PM

## 2023-09-07 NOTE — Consult Note (Signed)
 Consultation Note Date: 09/07/2023   Patient Name: Sara Barnes  DOB: 10/22/1940  MRN: [id number]  Age / Sex: 83 y.o., female  PCP: Estill Hemming, DO Referring Physician: Lorita Rosa, MD  Reason for Consultation: Establishing goals of care  HPI/Patient Profile: 83 y.o. 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   Patient required BiPAP en route to ED.  Continues to require BiPAP this morning and admitted for acute hypoxic respiratory failure and sepsis due to pneumonia, A-fib with RVR.  She also had a allergic response to contrast during workup, requiring second dose of IV Solu-Medrol , Benadryl  and famotidine  in the ED.  PMT has been consulted to assist with goals of care conversation in the context of her metastatic breast cancer.  Clinical Assessment and Goals of Care:  I have reviewed medical records including EPIC notes, labs and imaging, discussed with RN, assessed the patient and then at the bedside with patient's [name] and Sister [name] to discuss diagnosis prognosis, GOC, EOL wishes, disposition and options.  I introduced Palliative Medicine as specialized medical care for people living with serious illness. It focuses on providing relief from the symptoms and stress of a serious illness. The goal is to improve quality of life for both the patient and the family.  We discussed a brief life review of the patient and then focused on their current illness.  Patient's family shared that they will consider whether they would like palliative support moving forward in the outpatient setting.  I  attempted to elicit values and goals of care important to the patient.    Medical History Review and Understanding:  Dr. Murrel Arnt was present at the bedside to review medical updates with patient's family.  After patient was removed from BiPAP, I reviewed her treatment plan and provided updates to her as well.  Social History: Patient has been at [facility] for about a month since her hip repair.  Patient has 1 son, Gaile Jourdain.  She is very close with her younger sister [name], also has an older brother.  She previously enjoyed cooking, gardening, traveling but has not been able to do this in some time.  She still enjoys reading and spending time with her cat.  Functional and Nutritional State: Prior to her hip fracture, patient was living alone and completely independent, drives occasionally.  Albumin  of 3.1 noted.  Palliative Symptoms: Dyspnea, fatigue, occasional rib pain with bending over, diarrhea  Code Status: Concepts specific to code status, artifical feeding and hydration, and rehospitalization were considered and discussed.   Discussion: Patient's family feels like she had a good quality of life before her recent hospitalization and when patient was more able to participate, she confirms this is her feeling as well.  They are reassured that palliative is not hospice.  She does not like to take a lot of medications, even Tylenol , for pain.  Patient shares that she thought she was dying when she acutely declined last night and was very fearful of the feelings of breathlessness and air hunger.  She notes that she prefers the BiPAP to the thoracentesis she has had in the past.  She is winded during conversation, feeling like she will  likely need to go back on it. Patient's son felt that in past conversations, patient would not desire to be on life support but would be agreeable to CPR.  Patient herself would like more time to think about this and I encouraged continued conversations, planning for the best  case and worst-case scenarios and other anticipatory care needs.   Discussed the importance of continued conversation with family and the medical providers regarding overall plan of care and treatment options, ensuring decisions are within the context of the patient's values and GOCs.   Questions and concerns were addressed. The family was encouraged to call with questions or concerns.  PMT will continue to support holistically.   SUMMARY OF RECOMMENDATIONS   - Continue full code for now, patient and family would like to discuss further - Continue full scope treatment, goal is for improvement to previous baseline if possible - Patient and family will consider whether they would like outpatient palliative care follow-up within the cancer center - Psychosocial and emotional support provided -PMT will continue to follow and support   Prognosis:  Unable to determine  Discharge Planning: To Be Determined      Primary Diagnoses: Present on Admission:  Paroxysmal atrial fibrillation with RVR (HCC)  Sepsis due to pneumonia (HCC)  Breast cancer (HCC)  Respiratory distress  Hypokalemia  Hyponatremia    Physical Exam Vitals and nursing note reviewed.  Constitutional:      General: She is not in acute distress. Cardiovascular:     Rate and Rhythm: Normal rate. Rhythm irregular.  Pulmonary:     Effort: Tachypnea present.     Comments: Initially on BiPAP, weaned to [place] during conversation with family Neurological:     Mental Status: She is alert. Mental status is at baseline.  Psychiatric:        Mood and Affect: Mood normal.        Behavior: Behavior normal.     Vital Signs: BP (!) 143/76   Pulse (!) 103   Temp 98.6 F (37 C) (Oral)   Resp (!) 23   Ht 5\' 6"  (1.676 m)   Wt 78.9 kg   SpO2 100%   BMI 28.08 kg/m         SpO2: SpO2: 100 % O2 Device:SpO2: 100 % O2 Flow Rate: .O2 Flow Rate (L/min): 8 L/min    Total time: I spent 75 minutes in the care of the patient  today in the above activities and documenting the encounter.  MDM: high   Damari Suastegui Alroy Jericho, PA-C  Palliative Medicine Team Team phone # [phone]  Thank you for allowing the Palliative Medicine Team to assist in the care of this patient. Please utilize secure chat with additional questions, if there is no response within 30 minutes please call the above phone number.  Palliative Medicine Team providers are available by phone from 7am to 7pm daily and can be reached through the team cell phone.  Should this patient require assistance outside of these hours, please call the patient's attending physician.

## 2023-09-08 ENCOUNTER — Ambulatory Visit

## 2023-09-08 ENCOUNTER — Inpatient Hospital Stay (HOSPITAL_COMMUNITY)

## 2023-09-08 ENCOUNTER — Ambulatory Visit: Admitting: Hematology

## 2023-09-08 DIAGNOSIS — C50912 Malignant neoplasm of unspecified site of left female breast: Secondary | ICD-10-CM | POA: Diagnosis not present

## 2023-09-08 DIAGNOSIS — J189 Pneumonia, unspecified organism: Secondary | ICD-10-CM | POA: Diagnosis not present

## 2023-09-08 DIAGNOSIS — J9601 Acute respiratory failure with hypoxia: Secondary | ICD-10-CM | POA: Diagnosis not present

## 2023-09-08 DIAGNOSIS — G9341 Metabolic encephalopathy: Secondary | ICD-10-CM | POA: Diagnosis not present

## 2023-09-08 DIAGNOSIS — J123 Human metapneumovirus pneumonia: Secondary | ICD-10-CM

## 2023-09-08 DIAGNOSIS — I4891 Unspecified atrial fibrillation: Secondary | ICD-10-CM | POA: Diagnosis not present

## 2023-09-08 DIAGNOSIS — R0603 Acute respiratory distress: Secondary | ICD-10-CM | POA: Diagnosis not present

## 2023-09-08 DIAGNOSIS — I480 Paroxysmal atrial fibrillation: Secondary | ICD-10-CM

## 2023-09-08 DIAGNOSIS — C50919 Malignant neoplasm of unspecified site of unspecified female breast: Secondary | ICD-10-CM | POA: Diagnosis not present

## 2023-09-08 DIAGNOSIS — J90 Pleural effusion, not elsewhere classified: Secondary | ICD-10-CM | POA: Diagnosis not present

## 2023-09-08 LAB — RESPIRATORY PANEL BY PCR

## 2023-09-08 LAB — BASIC METABOLIC PANEL WITH GFR
Anion gap: 7 (ref 5–15)
BUN: 15 mg/dL (ref 8–23)
CO2: 22 mmol/L (ref 22–32)
Calcium: 8.2 mg/dL — ABNORMAL LOW (ref 8.9–10.3)
Chloride: 97 mmol/L — ABNORMAL LOW (ref 98–111)
Creatinine, Ser: 0.74 mg/dL (ref 0.44–1.00)
GFR, Estimated: >60 mL/min (ref >60)
Glucose, Bld: 126 mg/dL — ABNORMAL HIGH (ref 70–99)
Potassium: 4.4 mmol/L (ref 3.5–5.1)
Sodium: 126 mmol/L — ABNORMAL LOW (ref 135–145)

## 2023-09-08 LAB — GLUCOSE, CAPILLARY
Glucose-Capillary: 145 mg/dL — ABNORMAL HIGH (ref 70–99)
Glucose-Capillary: 145 mg/dL — ABNORMAL HIGH (ref 70–99)
Glucose-Capillary: 146 mg/dL — ABNORMAL HIGH (ref 70–99)
Glucose-Capillary: 148 mg/dL — ABNORMAL HIGH (ref 70–99)

## 2023-09-08 LAB — LACTATE DEHYDROGENASE: LDH: 140 U/L (ref 98–192)

## 2023-09-08 LAB — CBC
HCT: 30.4 % — ABNORMAL LOW (ref 36.0–46.0)
Hemoglobin: 9.8 g/dL — ABNORMAL LOW (ref 12.0–15.0)
MCH: 31.2 pg (ref 26.0–34.0)
MCHC: 32.2 g/dL (ref 30.0–36.0)
MCV: 96.8 fL (ref 80.0–100.0)
Platelets: 287 10*3/uL (ref 150–400)
RBC: 3.14 MIL/uL — ABNORMAL LOW (ref 3.87–5.11)
RDW: 12.3 % (ref 11.5–15.5)
WBC: 10.8 10*3/uL — ABNORMAL HIGH (ref 4.0–10.5)
nRBC: 0 % (ref 0.0–0.2)

## 2023-09-08 LAB — PROTEIN, TOTAL: Total Protein: 6.9 g/dL (ref 6.5–8.1)

## 2023-09-08 LAB — HEPARIN LEVEL (UNFRACTIONATED)
Heparin Unfractionated: 0.84 [IU]/mL — ABNORMAL HIGH (ref 0.30–0.70)
Heparin Unfractionated: >1.1 [IU]/mL — ABNORMAL HIGH (ref 0.30–0.70)
Heparin Unfractionated: 0.63 [IU]/mL (ref 0.30–0.70)

## 2023-09-08 MED ORDER — LORAZEPAM 2 MG/ML IJ SOLN
0.5000 mg | Freq: Once | INTRAMUSCULAR | Status: AC
  Administered 2023-09-08: 0.5 mg via INTRAVENOUS
  Filled 2023-09-08: qty 1

## 2023-09-08 MED ORDER — MORPHINE SULFATE (PF) 2 MG/ML IV SOLN
2.0000 mg | INTRAVENOUS | Status: DC | PRN
  Administered 2023-09-08: 2 mg via INTRAVENOUS
  Filled 2023-09-08: qty 1

## 2023-09-08 MED ORDER — FUROSEMIDE 10 MG/ML IJ SOLN
60.0000 mg | Freq: Three times a day (TID) | INTRAMUSCULAR | Status: AC
  Administered 2023-09-08 (×2): 60 mg via INTRAVENOUS
  Filled 2023-09-08 (×2): qty 6

## 2023-09-08 MED ORDER — IPRATROPIUM BROMIDE 0.02 % IN SOLN
RESPIRATORY_TRACT | Status: AC
Start: 2023-09-08 — End: 2023-09-08
  Administered 2023-09-08: 0.5 mg
  Filled 2023-09-08: qty 2.5

## 2023-09-08 MED ORDER — ARFORMOTEROL TARTRATE 15 MCG/2ML IN NEBU
15.0000 ug | INHALATION_SOLUTION | Freq: Two times a day (BID) | RESPIRATORY_TRACT | Status: DC
Start: 2023-09-08 — End: 2023-09-21
  Administered 2023-09-08 – 2023-09-21 (×26): 15 ug via RESPIRATORY_TRACT
  Filled 2023-09-08 (×25): qty 2

## 2023-09-08 MED ORDER — MUSCLE RUB 10-15 % EX CREA
TOPICAL_CREAM | CUTANEOUS | Status: DC | PRN
  Filled 2023-09-08: qty 85

## 2023-09-08 MED ORDER — IPRATROPIUM BROMIDE 0.02 % IN SOLN
0.5000 mg | RESPIRATORY_TRACT | Status: DC | PRN
  Administered 2023-09-08: 0.5 mg via RESPIRATORY_TRACT
  Filled 2023-09-08: qty 2.5

## 2023-09-08 MED ORDER — MORPHINE SULFATE (PF) 2 MG/ML IV SOLN
2.0000 mg | INTRAVENOUS | Status: DC | PRN
  Administered 2023-09-09: 2 mg via INTRAVENOUS
  Filled 2023-09-08: qty 1

## 2023-09-08 MED ORDER — GUAIFENESIN ER 600 MG PO TB12
600.0000 mg | ORAL_TABLET | Freq: Two times a day (BID) | ORAL | Status: DC
  Administered 2023-09-08 – 2023-09-19 (×22): 600 mg via ORAL
  Filled 2023-09-08 (×23): qty 1

## 2023-09-08 MED ORDER — ACETAMINOPHEN 325 MG PO TABS
650.0000 mg | ORAL_TABLET | Freq: Once | ORAL | Status: AC | PRN
  Administered 2023-09-08: 650 mg via ORAL
  Filled 2023-09-08: qty 2

## 2023-09-08 MED ORDER — ZOLPIDEM TARTRATE 5 MG PO TABS
5.0000 mg | ORAL_TABLET | Freq: Every evening | ORAL | Status: DC | PRN
  Administered 2023-09-08 – 2023-09-18 (×10): 5 mg via ORAL
  Filled 2023-09-08 (×11): qty 1

## 2023-09-08 MED ORDER — VITAMIN D 25 MCG (1000 UNIT) PO TABS
1000.0000 [IU] | ORAL_TABLET | Freq: Every day | ORAL | Status: DC
  Administered 2023-09-08 – 2023-09-18 (×11): 1000 [IU] via ORAL
  Filled 2023-09-08 (×12): qty 1

## 2023-09-08 MED ORDER — LEVALBUTEROL HCL 0.63 MG/3ML IN NEBU
0.6300 mg | INHALATION_SOLUTION | Freq: Four times a day (QID) | RESPIRATORY_TRACT | Status: DC | PRN
  Administered 2023-09-08 – 2023-09-13 (×5): 0.63 mg via RESPIRATORY_TRACT
  Filled 2023-09-08 (×6): qty 3

## 2023-09-08 MED ORDER — METHYLPREDNISOLONE SODIUM SUCC 125 MG IJ SOLR
125.0000 mg | Freq: Once | INTRAMUSCULAR | Status: AC
  Administered 2023-09-08: 125 mg via INTRAVENOUS
  Filled 2023-09-08: qty 2

## 2023-09-08 MED ORDER — IPRATROPIUM BROMIDE 0.02 % IN SOLN
0.5000 mg | Freq: Once | RESPIRATORY_TRACT | Status: AC | PRN
  Administered 2023-09-08: 0.5 mg via RESPIRATORY_TRACT
  Filled 2023-09-08: qty 2.5

## 2023-09-08 MED ORDER — REVEFENACIN 175 MCG/3ML IN SOLN
175.0000 ug | Freq: Every day | RESPIRATORY_TRACT | Status: DC
  Administered 2023-09-09 – 2023-09-21 (×13): 175 ug via RESPIRATORY_TRACT
  Filled 2023-09-08 (×13): qty 3

## 2023-09-08 NOTE — Procedures (Signed)
 Thoracentesis  Procedure Note  JOCABED BOSTON  [id number]  1941-04-23  Date:09/08/23  Time:4:00 PM   Provider Performing:Pete E Burna Carrier   Procedure: Thoracentesis without imaging guidance (40981)  Indication(s) Pleural Effusion  Consent Risks of the procedure as well as the alternatives and risks of each were explained to the patient and/or caregiver.  Consent for the procedure was obtained and is signed in the bedside chart  Anesthesia Topical only with 1% lidocaine     Time Out Verified patient identification, verified procedure, site/side was marked, verified correct patient position, special equipment/implants available, medications/allergies/relevant history reviewed, required imaging and test results available.   Sterile Technique Maximal sterile technique including full sterile barrier drape, hand hygiene, sterile gown, sterile gloves, mask, hair covering, sterile ultrasound probe cover (if used).  Procedure Description Ultrasound was used to identify appropriate pleural anatomy for placement and overlying skin marked.  Area of drainage cleaned and draped in sterile fashion. Lidocaine  was used to anesthetize the skin and subcutaneous tissue.  800 cc's of clear yellow appearing fluid was drained from the left pleural space. Catheter then removed and bandaid applied to site.   Complications/Tolerance None; patient tolerated the procedure well. Chest X-ray is ordered to confirm no post-procedural complication.   EBL Minimal   Specimen(s) Pleural fluid

## 2023-09-08 NOTE — Progress Notes (Addendum)
 Progress Note   Patient: Sara Barnes ZOX:[id number] DOB: [birth year] DOA: 09/06/2023     1 DOS: the patient was seen and examined on 09/08/2023   Brief hospital course: 83yo with h/o Crohn's s/p ileostomy, metastatic breast CA, afib not on AC due to hepatic hematoma, anxiety/depression, and recent hip repair currently in rehab who presented on 5/11 with SOB. While in the ER, she experienced a contrast reaction, as well. She is on BIPAP and was given steroids, benadryl , H2 blocker. She was diagnosed with sepsis due to PNA and afib with RVR.   Assessment and Plan:  Sepsis secondary to pneumonia with respiratory distress Patient with recent hospitalization for hip fracture, presenting from SNF rehab with 2 days of URI and worsening respiratory failure  Lowest documented O2 sats are 92% but she was significantly SOB and placed on BIPAP -> [place] O2 Respiratory panel negative SIRS criteria with tachycardia, tachypnea; lactate of 4.1 indicating acute organ dysfunction CXR with marked severity L basilar atelectasis vs. Infiltrate CT with R/ L basilar atelectasis and small patchy R-sided infiltrates, likely multifocal PNA Also with midsternal lymphadenopathy concern for metastasis IV vancomycin  and cefepime given recent hospitalization BiPAP -> [place] O2 Continue nebulizers Went back to see patient for wheezing - moving air well, normal sats, mildly increased; will order CXR and consult PCCM (family request)   Contrast-induced allergic reaction Premedicated prior to CT with Benadryl  and famotidine  but after the contrast load patient developed wheezing and was placed on BiPAP for respiratory distress Also received Solu-Medrol  125 mg with EMS before coming to the ED Received second dose of IV Solu-Medrol , Benadryl  and famotidine  in the ED.   After discussion with PCCM recommended to continue the BiPAP, continue IV Solu-Medrol  or Decadron , IV Benadryl  and IV famotidine  as scheduled  Unable to give  epinephrine  shot given patient has A-fib RVR Continue IV Solu-Medrol  40 mg twice daily, iv Benadryl  25 mg 3 times daily and iv famotidine  20 mg twice daily. Continue ipratropium every 6 hours scheduled Avoid albuterol  and levalbuterol  as it provokes A-fib RVR   A-fib with RVR with h/o paroxysmal atrial fibrillation Developed RVR after administration of SoluMedrol/Abluterol with EMS Per chart review, patient has history of paroxysmal afib (last in 2018 during hospitalization); family denies knowledge of this Not on anticoagulation due to history of hepatic hematoma in 2022  (resolved) Also not on rate controlling medications Started on IV heparin  drip and IV Cardizem drip Cardiology consulted Echocardiogram unremarkable   Acute metabolic encephalopathy Confusion on presentation in the setting of sepsis, respiratory, distress, and A-fib with RVR Continue delirium precautions Appears to have resolved  Anxiety Patient takes Xanax  and Ambien  at home Wheezing today may be exacerbated by anxiety; will give a one-time dose of IV lorazepam    History of hip fracture Resume oxycodone  Presented from [facility], there for rehab Reports that she has done great with rehab and is planning to return home with her sister following this hospitalization   History of metastatic breast cancer to skin, liver and bone Metastatic breast cancer to skin, liver and bone CT C/A/P with new and old lytic lesions and concern for hilar/mediastinal LN mets Consulted inpatient palliative care to discuss further goal of care with patient and family Dr. Maryalice Smaller consulted If her functional status does not recover, she will need to stop chemotherapy Hospice was offered Palliative care consulted and patient/family are declining palliative/hospice F/u with Dr. Maryalice Smaller in 2-3 weeks   Chronic hyponatremia Sodium level appears to be at/near  baseline   Mood d/o Continue alprazolam , fluoxetine    Crohn's disease On daily  prednisone  but this is on hold while on Solumedrol     Consultants: Oncology Cardiology Palliative care PCCM by telephone only   Procedures: Echocardiogram 5/12   Antibiotics: Azithromycin  x 1 Ceftriaxone x 1 Cefepime 5/12- Vancomycin  5/12-    30 Day Unplanned Readmission Risk Score    Flowsheet Row ED to Hosp-Admission (Current) from 09/06/2023 in [facility]-ICU/STEPDOWN  30 Day Unplanned Readmission Risk Score (%) 26.01 Filed at 09/08/2023 0401       This score is the patient's risk of an unplanned readmission within 30 days of being discharged (0 -100%). The score is based on dignosis, age, lab data, medications, orders, and past utilization.   Low:  0-14.9   Medium: 15-21.9   High: 22-29.9   Extreme: 30 and above           Subjective: Afib is controlled now and patient feeling better this AM but resumed wheezing.  Given RVR with Albuterol  and Solumedrol with EMS, this is tricky.  Family and patient request to remain in SDU for an additional night.   Objective: Vitals:   09/08/23 1000 09/08/23 1200  BP: (!) 140/93 127/85  Pulse: 68 71  Resp: 20 (!) 27  Temp:  97.6 F (36.4 C)  SpO2: 97% 92%    Intake/Output Summary (Last 24 hours) at 09/08/2023 1254 Last data filed at 09/08/2023 0935 Gross per 24 hour  Intake 967.6 ml  Output 1900 ml  Net -932.4 ml   Filed Weights   09/06/23 2227  Weight: 78.9 kg    Exam:  General:  Appears better, chronically ill, on [place] O2 Eyes:   normal lids, iris ENT:  normal hearing; grossly normal lips and tongue; mmm Cardiovascular:  RRR. No LE edema.  Respiratory:   CTA bilaterally with no wheezes/rales/rhonchi.  Normal to mildly increased respiratory effort. Skin:  no rash or induration seen on limited exam Musculoskeletal:  no bony abnormality Psychiatric: blunted mood and affect, speech fluent and appropriate; A&O x 3 Neurologic:  no gross abnormalities on limited exam  Data Reviewed: I have  reviewed the patient's lab results since admission.  Pertinent labs for today include:  Na++ 126, down from 129 Glucose 126 WBC 10.8 Hgb 9.8 Blood cultures NTD    Family Communication: None present at the time of my evaluation but have since been present at the bedside and are aware of updated plan  Disposition: Status is: Inpatient Remains inpatient appropriate because: ongoing management     Total critical care time: 55 minutes Critical care time was exclusive of separately billable procedures and treating other patients. Critical care was necessary to treat or prevent imminent or life-threatening deterioration. Critical care was time spent personally by me on the following activities: development of treatment plan with patient and/or surrogate as well as nursing, discussions with consultants, evaluation of patient's response to treatment, examination of patient, obtaining history from patient or surrogate, ordering and performing treatments and interventions, ordering and review of laboratory studies, ordering and review of radiographic studies, pulse oximetry and re-evaluation of patient's condition.   Unresulted Labs (From admission, onward)     Start     Ordered   09/09/23 0500  Basic metabolic panel with GFR  Tomorrow morning,   R       Question:  Specimen collection method  Answer:  Unit=Unit collect   09/08/23 0752   09/08/23 1600  Heparin  level (  unfractionated)  Once-Timed,   TIMED       Question:  Specimen collection method  Answer:  Unit=Unit collect   09/08/23 0735   09/08/23 0500  Heparin  level (unfractionated)  Daily,   R     Question:  Specimen collection method  Answer:  Lab=Lab collect   09/07/23 1928   09/07/23 0830  CBC  Daily,   R      09/07/23 0125   09/07/23 0503  TSH  Add-on,   AD        09/07/23 0502   09/07/23 0451  Blood gas, arterial  ONCE - STAT,   R        09/07/23 0450   09/07/23 0451  Expectorated Sputum Assessment w Gram Stain, Rflx to Resp  Cult  (COPD / Pneumonia / Cellulitis / Lower Extremity Wound (Diabetic Foot Infection))  Once,   R        09/07/23 0450   09/07/23 0451  Legionella Pneumophila Serogp 1 Ur Ag  (COPD / Pneumonia / Cellulitis / Lower Extremity Wound (Diabetic Foot Infection))  Once,   R        09/07/23 0450   09/07/23 0451  Strep pneumoniae urinary antigen  (COPD / Pneumonia / Cellulitis / Lower Extremity Wound (Diabetic Foot Infection))  Once,   R        09/07/23 8295             Author: Lorita Rosa, MD 09/08/2023 12:54 PM  For on call review [email or web address].

## 2023-09-08 NOTE — Progress Notes (Signed)
 PHARMACY - ANTICOAGULATION CONSULT NOTE  Pharmacy Consult for Heparin  Indication: atrial fibrillation  Allergies  Allergen Reactions   Nsaids Other (See Comments)    Crohn's disease = NO NSAIDs   Codeine Nausea And Vomiting   Iodinated Contrast Media     Elevated BP,Itchy skin. Face red in color   Mesalamine  Nausea And Vomiting   Omnipaque  [Iohexol ] Itching    Itching and flushed/red face   Oxycodone  Itching and Rash   Albuterol      Atrial fibrillation RVR   Contrast Allergy Premed Pack [Prednisone  & Diphenhydramine ] Hives    Hives, rash, wheezing and shortness of breath   Lactose Intolerance (Gi) Diarrhea   Norco [Hydrocodone -Acetaminophen ] Nausea Only   Other Itching    PERFUMED LOTIONS  Cant have MRI due to ear implant    Sulfa Antibiotics Rash    Patient Measurements: Height: 5\' 6"  (167.6 cm) Weight: 78.9 kg (174 lb) IBW/kg (Calculated) : 59.3 HEPARIN  DW (KG): 75.6  Vital Signs: Temp: 97.6 F (36.4 C) (05/13 1200) Temp Source: Oral (05/13 1200) BP: 139/75 (05/13 1635) Pulse Rate: 80 (05/13 1638)  Labs: Recent Labs    09/06/23 2239 09/07/23 0640 09/07/23 0930 09/07/23 1723 09/08/23 0522 09/08/23 0810 09/08/23 1549 09/08/23 1711  HGB 11.0* 10.5* 10.3*  --  9.8*  --   --   --   HCT 34.2* 32.8* 32.6*  --  30.4*  --   --   --   PLT 278 264 269  --  287  --   --   --   LABPROT 13.7  --   --   --   --   --   --   --   INR 1.0  --   --   --   --   --   --   --   HEPARINUNFRC  --   --  0.72*   < > 0.84*  --  >1.10* 0.63  CREATININE 0.80 0.83  --   --   --  0.74  --   --    < > = values in this interval not displayed.    Estimated Creatinine Clearance: 58.4 mL/min (by C-G formula based on SCr of 0.74 mg/dL).  Medications:  Lovenox  40 mg sq daily - LD on 5/9  Assessment:  83 yr female admitted on 5/11 with AFib with RVR.  History of breast cancer.  Discharged 08/25/23 s/p repair of hip fracture.  Has been on Lovenox  40mg  sq daily for VTE  prophylaxis CTAngio 5/12 negative for PE  Today, 09/08/2023 Heparin  level > 1.1 (error), redraw 0.63 - therapeutic on heparin  950 units/hr CBC: Hgb down to 9.8, Plt WNL No bleeding or complications noted  Goal of Therapy:  Heparin  level 0.3-0.7 units/ml Monitor platelets by anticoagulation protocol: Yes   Plan:  Continue heparin  infusion at 950 units/hr Daily heparil level & CBC Monitor for signs & symptoms of bleeding   Lolita Rise, PharmD, BCPS Clinical Pharmacist 09/08/2023 5:55 PM

## 2023-09-08 NOTE — Consult Note (Signed)
 NAME:  Sara Barnes, MRN:  [id number], DOB:  08-01-1940, LOS: 1 ADMISSION DATE:  09/06/2023, CONSULTATION DATE:  5/13 REFERRING MD:  Jenell Mirza, CHIEF COMPLAINT:  wheezing, respiratory failure   History of Present Illness:  Sara Barnes woman with a history of metastatic breast cancer who was admitted to the hospital with acute respiratory failure on 5/12 after developing shortness of breath, productive cough, wheezing on 5/11.  She had a fall with hip fracture in April and has been at rehab since then, making good progress.  At baseline for the last 2 years she has had activity limitation, but it is unclear if this is more due to fatigue or dyspnea on exertion.  This had been at baseline until her symptoms began acutely 2 days ago.  En route to the hospital on BiPAP she developed A-fib with RVR after receiving a breathing treatment.  She is out of A-fib today and remains off BiPAP.  She had an episode of worse shortness of breath this morning.  Family requesting pulmonology consult for wheezing.  No PTA inhalers.  No history of tobacco abuse.  Has a history of thoracentesis in January 2023> atypical cells but not confirmed malignant.  She reports that her symptoms now feel like when she had her thoracentesis in 2023.  No chronic antiplatelet or anticoagulant medications.  History is obtained with help of her sister due to patient being very hard of hearing.  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 Hip fracture 07/2023 Crohn's  Significant Hospital Events: Including procedures, antibiotic start and stop dates in addition to other pertinent events   12 admitted, started on azithromycin  and ceftriaxone  Interim History / Subjective:    Objective    Blood pressure 127/85, pulse 71, temperature 97.6 F (36.4 C), temperature source Oral, resp. rate (!) 27, height 5\' 6"  (1.676 m), weight 78.9 kg, SpO2 92%.        Intake/Output Summary (Last 24 hours)  at 09/08/2023 1332 Last data filed at 09/08/2023 0935 Gross per 24 hour  Intake 915.89 ml  Output 1900 ml  Net -984.11 ml   Filed Weights   09/06/23 2227  Weight: 78.9 kg    Examination: General: Frail and ill appearing woman lying in bed no acute distress HENT: [place]/AT, eyes anicteric Lungs: Minimal expiratory wheezing, decreased left basilar breath sounds, faint rales.  Frequent wet sounding cough.  Mild conversational dyspnea.  Breathing comfortably on 4 L nasal cannula Cardiovascular: S1-S2, regular rate and rhythm.  No JVD. Abdomen: Soft, nontender Extremities: Mild lower extremity edema bilaterally Neuro: Awake, alert, answering questions, hard of hearing  CXR today personally reviewed- mild right lower lobe infiltrate, left hemidiaphragm silhouette did with dense opacification in the left lower hemithorax  CT chest personally reviewed> left greater than right dependent pleural effusions, compressive atelectasis more likely than pneumonia left lower lobe  WBC 10.8 H/H 9.8/30.4 Platelets 287  Echo: LVEF 65 to 70% regional wall motion abnormalities.  Normal RV size and function.  Mildly dilated left atrium.  Trivial MR and AI.  Dilated IVC with reduced respiratory variability with atrial pressure of 15.  Resolved problem list   Assessment and Plan  Acute respiratory failure with hypoxia due to left lower lobe pneumonia Bilateral dependent pleural effusions; left effusion likely parapneumonic since that is larger and may be loculated based on CT appearance -Diuresis to resolve right effusion - Needs thoracentesis on the left; if effusion appears loculated on ultrasound may need  chest tube.  Discussed this with the patient and her sister. -Continue antibiotics-ceftriaxone azithromycin  - Start Brovana and Yupelri to help with wheezing; should not cause tremulousness or tachycardia.  Do not anticipate that she would require long-term bronchodilators without history of obstructive  lung disease. -Pulmonary hygiene, out of bed mobility -Check respiratory viral panel - Stable to transfer to tele floor.   Stage 4 metastatic breast cancer -palliative care consulted; agree with Oncology's recommendation to consider hospice  Hyponatremia -Lasix  x 2 doses -Avoid hypotonic fluids   Best Practice (right click and "Reselect all SmartList Selections" daily)   Per primary  Labs   CBC: Recent Labs  Lab 09/06/23 2239 09/07/23 0640 09/07/23 0930 09/08/23 0522  WBC 6.0 3.3* 3.9* 10.8*  NEUTROABS 3.7  --   --   --   HGB 11.0* 10.5* 10.3* 9.8*  HCT 34.2* 32.8* 32.6* 30.4*  MCV 96.6 95.9 97.6 96.8  PLT 278 264 269 287    Basic Metabolic Panel: Recent Labs  Lab 09/06/23 2239 09/07/23 0532 09/07/23 0640 09/08/23 0810  NA 130*  --  129* 126*  K 2.8*  --  3.8 4.4  CL 97*  --  98 97*  CO2 23  --  21* 22  GLUCOSE 136*  --  166* 126*  BUN 7*  --  7* 15  CREATININE 0.80  --  0.83 0.74  CALCIUM  8.7*  --  8.6* 8.2*  MG  --  1.6*  --   --    GFR: Estimated Creatinine Clearance: 58.4 mL/min (by C-G formula based on SCr of 0.74 mg/dL). Recent Labs  Lab 09/06/23 2239 09/07/23 0407 09/07/23 0640 09/07/23 0930 09/07/23 1723 09/08/23 0522  WBC 6.0  --  3.3* 3.9*  --  10.8*  LATICACIDVEN 2.1* 4.1*  --  2.8* 0.8  --     Liver Function Tests: Recent Labs  Lab 09/06/23 2239 09/07/23 0640  AST 16 24  ALT 11 14  ALKPHOS 142* 122  BILITOT 0.6 0.6  PROT 6.5 6.3*  ALBUMIN  3.4* 3.1*   No results for input(s): "LIPASE", "AMYLASE" in the last 168 hours. No results for input(s): "AMMONIA" in the last 168 hours.  ABG    Component Value Date/Time   PHART 7.41 09/07/2023 0629   PCO2ART 34 09/07/2023 0629   PO2ART 107 09/07/2023 0629   HCO3 22.2 09/07/2023 0629   TCO2 21 (L) 03/23/2017 2011   ACIDBASEDEF 1.9 09/07/2023 0629   O2SAT 99.7 09/07/2023 0629     Coagulation Profile: Recent Labs  Lab 09/06/23 2239  INR 1.0    Cardiac Enzymes: No results  for input(s): "CKTOTAL", "CKMB", "CKMBINDEX", "TROPONINI" in the last 168 hours.  HbA1C: Hgb A1c MFr Bld  Date/Time Value Ref Range Status  11/12/2020 11:49 AM 5.5 4.6 - 6.5 % Final    Comment:    Glycemic Control Guidelines for People with Diabetes:Non Diabetic:  <6%Goal of Therapy: <7%Additional Action Suggested:  >8%   12/18/2010 11:54 AM 5.5 4.6 - 6.5 % Final    Comment:    Glycemic Control Guidelines for People with Diabetes:Non Diabetic:  <6%Goal of Therapy: <7%Additional Action Suggested:  >8%     CBG: Recent Labs  Lab 09/07/23 1226 09/07/23 1804 09/07/23 2322 09/08/23 0645 09/08/23 1147  GLUCAP 137* 131* 134* 145* 148*    Review of Systems:   Review of Systems  Constitutional:  Positive for malaise/fatigue. Negative for chills and fever.  HENT:  Negative for congestion.   Respiratory:  Positive for cough, sputum production, shortness of breath and wheezing.   Cardiovascular:  Negative for chest pain.       Dependent leg swelling since surgery, only when sitting in a chair  Gastrointestinal:  Positive for constipation. Negative for diarrhea, nausea and vomiting.  Musculoskeletal: Negative.      Past Medical History:  She,  has a past medical history of Allergy, Anemia, Anxiety, Arthritis, Basal cell carcinoma, Blood transfusion without reported diagnosis, Bowel perforation (HCC), Cancer of lower-inner quadrant of female breast (HCC) (11/30/2014), Cancer of lower-inner quadrant of left female breast (HCC) (11/30/2014), Clostridium difficile infection, Complication of anesthesia, Crohn's disease of large intestine with abscess (HCC), Dental crowns present, Depression, Family history of adverse reaction to anesthesia, GERD (gastroesophageal reflux disease), Hyperlipidemia, Indigestion, Liver carcinoma (HCC) (2023), Lung cancer (HCC), Perianal abscess, Personal history of radiation therapy (2016), PONV (postoperative nausea and vomiting), Runny nose (12/07/2014), Seasonal  allergies, TMJ (temporomandibular joint syndrome), Ulcerative colitis, and Vitamin B12 deficiency.   Surgical History:   Past Surgical History:  Procedure Laterality Date   ABDOMINAL HYSTERECTOMY     partial   APPENDECTOMY     AUGMENTATION MAMMAPLASTY Bilateral    BASAL CELL CARCINOMA EXCISION Left    nose   BREAST BIOPSY Left 2019   benign   BREAST ENHANCEMENT SURGERY     BREAST LUMPECTOMY Left 2016   BREAST LUMPECTOMY WITH RADIOACTIVE SEED AND SENTINEL LYMPH NODE BIOPSY Left 12/13/2014   Procedure: LEFT BREAST LUMPECTOMY WITH RADIOACTIVE SEED AND SENTINEL LYMPH NODE BIOPSY;  Surgeon: Lockie Rima, MD;  Location: [facility];  Service: General;  Laterality: Left;   COLONOSCOPY WITH PROPOFOL   05/28/2011; 06/28/2014   EXPLORATORY LAPAROTOMY  04/08/2018   with end ileostomy takedown and creation of loop ileostomy-- [facility]-Dr Aretta Beery   FLEXIBLE SIGMOIDOSCOPY N/A 01/08/2016   Procedure: Marlynn Singer;  Surgeon: Danette Duos, MD;  Location: [facility];  Service: Gastroenterology;  Laterality: N/A;  needs MAC due to pain and anxiety   ILEOSTOMY N/A 05/25/2017   Procedure: DILATION OF ILEOSTOMY;  Surgeon: Enid Harry, MD;  Location: [facility];  Service: General;  Laterality: N/A;   ILEOSTOMY  02/11/2016   END ileostomy.  Dr Gaylyn Keas   ILEOSTOMY CLOSURE N/A 06/02/2017   Procedure: RESECTION OF ILEAL STRICTURE; ILEOSTOMY REVISION ;  Surgeon: Candyce Champagne, MD;  Location: [facility];  Service: General;  Laterality: N/A;   ILEOSTOMY DILATION  05/25/2017   DILITATION OF ILEAL STRICTURE   ileostomy stricture resection  06/02/2017   with revision   INCISION AND DRAINAGE ABSCESS  2018   INCISION AND DRAINAGE PERIRECTAL ABSCESS N/A 09/28/2013   Procedure: IRRIGATION AND DEBRIDEMENT PERIANAL ABSCESS, proctoscopy;  Surgeon: Thayne Fine, MD;  Location: [facility];  Service: General;  Laterality: N/A;   INCISION AND DRAINAGE PERIRECTAL ABSCESS  N/A 01/22/2016   Procedure: IRRIGATION AND DEBRIDEMENT PERIRECTAL ABSCESS;  Surgeon: Oralee Billow, MD;  Location: [facility];  Service: General;  Laterality: N/A;   INNER EAR SURGERY Bilateral    INTRAMEDULLARY (IM) NAIL INTERTROCHANTERIC Left 08/14/2023   Procedure: FIXATION, FRACTURE, INTERTROCHANTERIC, WITH INTRAMEDULLARY ROD;  Surgeon: Laneta Pintos, MD;  Location: [facility];  Service: Orthopedics;  Laterality: Left;   LAPAROSCOPIC DIVERTED COLOSTOMY N/A 02/11/2016   Procedure: LAPAROSCOPIC DIVERTED ILEOSTOMY;  Surgeon: Jacolyn Matar, MD;  Location: [facility];  Service: General;  Laterality: N/A;   LAPAROSCOPY N/A 03/23/2017   Procedure: LAPAROSCOPY DIAGNOSTIC, EXPLORATORY LAPAROTOMY,  BOWEL RESECTION;  Surgeon: Adalberto Hollow,  MD;  Location: [facility];  Service: General;  Laterality: N/A;     Social History:   reports that she has never smoked. She has never used smokeless tobacco. She reports that she does not currently use alcohol after a past usage of about 7.0 standard drinks of alcohol per week. She reports that she does not use drugs.   Family History:  Her family history includes Anesthesia problems in her sister; Asthma in her sister; Brain cancer in her father; Diabetes in her brother; Emphysema in her maternal grandfather; Kidney disease in her brother; Prostate cancer (age of onset: 37) in her brother; Rheum arthritis in her brother and mother. There is no history of Esophageal cancer, Breast cancer, Rectal cancer, Stomach cancer, or Colon cancer.   Allergies Allergies  Allergen Reactions   Nsaids Other (See Comments)    Crohn's disease = NO NSAIDs   Codeine Nausea And Vomiting   Iodinated Contrast Media     Elevated BP,Itchy skin. Face red in color   Mesalamine  Nausea And Vomiting   Omnipaque  [Iohexol ] Itching    Itching and flushed/red face   Oxycodone  Itching and Rash   Albuterol      Atrial fibrillation RVR   Contrast Allergy Premed Pack [Prednisone  & Diphenhydramine ] Hives     Hives, rash, wheezing and shortness of breath   Lactose Intolerance (Gi) Diarrhea   Norco [Hydrocodone -Acetaminophen ] Nausea Only   Other Itching    PERFUMED LOTIONS  Cant have MRI due to ear implant    Sulfa Antibiotics Rash     Home Medications  Prior to Admission medications   Medication Sig Start Date End Date Taking? Authorizing Provider  acetaminophen  (TYLENOL ) 500 MG tablet You can take 2 tablets every 6 hours as needed for pain.  Use this as your primary pain control.  Use Tramadol  as a last resort.  You can buy this over the counter at any drug store.    DO NOT TAKE MORE THAN 4000 MG OF TYLENOL  PER DAY.  IT CAN HARM YOUR LIVER. Patient taking differently: Take 1,000 mg by mouth in the morning, at noon, and at bedtime. You can take 2 tablets every 6 hours as needed for pain.  Use this as your primary pain control.  Use Tramadol  as a last resort.  You can buy this over the counter at any drug store.    DO NOT TAKE MORE THAN 4000 MG OF TYLENOL  PER DAY.  IT CAN HARM YOUR LIVER. 03/31/17  Yes Monetta Angst, PA-C  ALPRAZolam  (XANAX ) 0.25 MG tablet Take 1 tablet (0.25 mg total) by mouth 2 (two) times daily as needed for anxiety. 08/25/23  Yes Samtani, Jai-Gurmukh, MD  cholecalciferol  (CHOLECALCIFEROL ) 25 MCG tablet Take 1 tablet (1,000 Units total) by mouth daily. 08/20/23  Yes McClung, Sarah A, PA-C  cyanocobalamin  (VITAMIN B12) 1000 MCG/ML injection INJECT 1ML INTO THE MUSCLE ONCE PER MONTH 03/27/22  Yes Pyrtle, Amber Bail, MD  diphenoxylate -atropine  (LOMOTIL ) 2.5-0.025 MG tablet TAKE 1 TO 2 TABLETS BY MOUTH THREE TO FOUR TIMES DAILY AS NEEDED FOR SEVERE DIARRHEA Patient taking differently: Take 1 tablet by mouth 3 (three) times daily as needed for diarrhea or loose stools. FOR SEVERE DIARRHEA 05/21/23  Yes Zehr, Jessica D, PA-C  enoxaparin  (LOVENOX ) 40 MG/0.4ML injection Inject 0.4 mLs (40 mg total) into the skin daily. 08/20/23 09/19/23 Yes McClung, Genevive Ket, PA-C  famotidine  (PEPCID ) 20 MG  tablet Take 1 tablet (20 mg total) by mouth 2 (two) times daily. Patient taking differently:  Take 20 mg by mouth 2 (two) times daily as needed for heartburn. 01/16/21  Yes Pyrtle, Amber Bail, MD  FLUoxetine  (PROZAC ) 20 MG capsule Take 1 capsule (20 mg total) by mouth daily. 08/25/23 09/24/23 Yes Samtani, Jai-Gurmukh, MD  fluticasone  (FLONASE ) 50 MCG/ACT nasal spray Place 2 sprays into both nostrils daily. 11/12/20  Yes Lowne Chase, Yvonne R, DO  guaiFENesin (MUCINEX) 600 MG 12 hr tablet Take 600 mg by mouth 2 (two) times daily.   Yes [provider]  lidocaine -prilocaine  (EMLA ) cream Apply 1 Application topically as needed (FOR INEJCTION PAIN). 05/18/23  Yes Sonja Pomeroy, MD  oxyCODONE  10 MG TABS Take 1 tablet (10 mg total) by mouth every 4 (four) hours. Patient taking differently: Take 10 mg by mouth as directed. Take 1 tablet (10 mg) TID & Take 1 tablet (10 mg) Daily PRN for Pain Mgt 08/25/23  Yes Samtani, Jai-Gurmukh, MD  predniSONE  (DELTASONE ) 5 MG tablet TAKE 1 TABLET BY MOUTH EVERY DAY 05/21/23  Yes Zehr, Jessica D, PA-C  zolpidem  (AMBIEN ) 10 MG tablet TAKE 1 TABLET(10 MG) BY MOUTH AT BEDTIME AS NEEDED FOR SLEEP 08/25/23  Yes Samtani, Jai-Gurmukh, MD     Critical care time:       Joesph Mussel, DO 09/08/23 3:11 PM [facility]  For contact information, see Amion. If no response to pager, please call PCCM consult pager. After hours, 7PM- 7AM, please call Elink.

## 2023-09-08 NOTE — Progress Notes (Signed)
 Rounding Note    Patient Name: Sara Barnes Date of Encounter: 09/08/2023  [facility] Cardiologist: Olinda Bertrand, DO (new)  Chief Complaint:  Chief Complaint  Patient presents with   Respiratory Distress  Reason of consult: Atrial fibrillation with rapid ventricular rate  Subjective   Resting in bed comfortably. Status post thoracentesis Accompanied by her Arnita Laos at bedside Denies anginal chest pain or heart failure symptoms. Has converted to sinus rhythm Remains on IV heparin  drip  Inpatient Medications    Scheduled Meds:  arformoterol  15 mcg Nebulization BID   Chlorhexidine  Gluconate Cloth  6 each Topical Daily   vitamin D3  1,000 Units Oral Daily   diphenhydrAMINE   25 mg Intravenous Q8H   FLUoxetine   20 mg Oral Daily   fluticasone   2 spray Each Nare Daily   guaiFENesin  600 mg Oral BID   methylPREDNISolone  (SOLU-MEDROL ) injection  40 mg Intravenous Q12H   oxyCODONE   10 mg Oral TID   revefenacin  175 mcg Nebulization Daily   sodium chloride  flush  10-40 mL Intracatheter Q12H   sodium chloride  flush  3 mL Intravenous Q12H   Continuous Infusions:  ceFEPime (MAXIPIME) IV Stopped (09/08/23 1639)   heparin  950 Units/hr (09/08/23 2109)   PRN Meds: ALPRAZolam , famotidine , levalbuterol , morphine  injection, Muscle Rub, sodium chloride  flush, zolpidem    Vital Signs    Vitals:   09/08/23 1638 09/08/23 1808 09/08/23 1821 09/08/23 2014  BP:  (!) 147/76    Pulse: 80  80   Resp: (!) 28  (!) 28   Temp:    97.7 F (36.5 C)  TempSrc:    Axillary  SpO2: 99%  94%   Weight:      Height:        Intake/Output Summary (Last 24 hours) at 09/08/2023 2335 Last data filed at 09/08/2023 1639 Gross per 24 hour  Intake 734.08 ml  Output 2000 ml  Net -1265.92 ml      09/06/2023   10:27 PM 08/25/2023    5:00 AM 08/23/2023    4:54 AM  Last 3 Weights  Weight (lbs) 174 lb 174 lb 13.2 oz 175 lb 7.8 oz  Weight (kg) 78.926 kg 79.3 kg 79.6 kg      Telemetry     SR - Personally Reviewed  ECG    No new tracing - Personally Reviewed  Physical Exam   Constitutional: No distress. She appears chronically ill.  hemodynamically stable  Neck: No JVD present.  Cardiovascular: S1 normal and S2 normal. An irregularly irregular rhythm present. Tachycardia present.  No murmurs rubs or gallops appreciated secondary to tachycardia  Pulmonary/Chest: No stridor. She has no wheezes. She has no rales.  Rhonchi noted bilateral. Left partial mastectomy  Musculoskeletal:        General: No edema.     Cervical back: Neck supple.  Neurological: She is alert and oriented to person, place, and time.  Skin: Skin is warm.  Ecchymosis    Labs    High Sensitivity Troponin:  No results for input(s): "TROPONINIHS" in the last 720 hours.   Chemistry Recent Labs  Lab 09/06/23 2239 09/07/23 0532 09/07/23 0640 09/08/23 0810 09/08/23 1613  NA 130*  --  129* 126*  --   K 2.8*  --  3.8 4.4  --   CL 97*  --  98 97*  --   CO2 23  --  21* 22  --   GLUCOSE 136*  --  166* 126*  --  BUN 7*  --  7* 15  --   CREATININE 0.80  --  0.83 0.74  --   CALCIUM  8.7*  --  8.6* 8.2*  --   MG  --  1.6*  --   --   --   PROT 6.5  --  6.3*  --  6.9  ALBUMIN  3.4*  --  3.1*  --   --   AST 16  --  24  --   --   ALT 11  --  14  --   --   ALKPHOS 142*  --  122  --   --   BILITOT 0.6  --  0.6  --   --   GFRNONAA >60  --  >60 >60  --   ANIONGAP 10  --  10 7  --     Lipids No results for input(s): "CHOL", "TRIG", "HDL", "LABVLDL", "LDLCALC", "CHOLHDL" in the last 168 hours.  Hematology Recent Labs  Lab 09/07/23 0640 09/07/23 0930 09/08/23 0522  WBC 3.3* 3.9* 10.8*  RBC 3.42* 3.34* 3.14*  HGB 10.5* 10.3* 9.8*  HCT 32.8* 32.6* 30.4*  MCV 95.9 97.6 96.8  MCH 30.7 30.8 31.2  MCHC 32.0 31.6 32.2  RDW 11.9 11.9 12.3  PLT 264 269 287   Thyroid  No results for input(s): "TSH", "FREET4" in the last 168 hours.  BNP Recent Labs  Lab 09/07/23 0532  BNP 531.6*    DDimer No  results for input(s): "DDIMER" in the last 168 hours.   Radiology    CTA chest PE protocol 09/07/2023 1. No evidence of pulmonary embolism. 2. Small patchy right apical and anterior right upper lobe infiltrates. 3. Small right pleural effusion with a moderate size left pleural effusion. 4. Small, stable anterior mediastinal lymph node which corresponds to a hypermetabolic focus seen on the prior nuclear medicine PET/CT. 5. Findings consistent with osseous metastatic disease, as described above. 6. Small hiatal hernia. 7. Aortic atherosclerosis.   Cardiac Studies   Echocardiogram 09/07/2023 LVEF 65 to 70%. No regional wall motion abnormalities. Right ventricular size and function normal. Estimated RVSP 36 mmHg. Left atrium mildly dilated. No mitral stenosis. Trivial AR. Estimated RAP 15 mmHg  Patient Profile     83 y.o. female with a hx of metastatic breast cancer, Crohn's disease s/p ileostomy with reversal, PAF not on anticoagulation, and recent hip fracture s/p repair who is being seen for the evaluation of Afib RVR at the request of Dr. Murrel Arnt.   Assessment & Plan    Atrial fibrillation with rapid ventricular rate Paroxysmal A-fib Now in normal sinus rhythm On IV heparin  for thromboembolic prophylaxis Was on Cardizem drip-discontinued earlier this morning. Currently she is status post thoracentesis, mild discomfort, will continue telemetry and uptitrate AV nodal blocking agents tomorrow based on her ventricular rate. Click Here to Calculate/Change CHADS2VASc Score The patient's CHADS2-VASc score is 5, indicating a 7.2% annual risk of stroke.  CHF History: No HTN History: Yes Diabetes History: No Stroke History: No Vascular Disease History: Yes  Based on her CHA2DS2-VASc ideally she should be on anticoagulation to prevent thromboembolic prophylaxis. However, clinically benefits and risk have to be taken into consideration and patient needs to participate in a shared  decision-making process.  Next of kin is son and sister has also been quite supportive during the process. Family still has not decided if patient would like to proceed with anticoagulation posthospitalization. Currently patient was placed on IV heparin  given concern that she had a  history of hematoma status post liver biopsy.  If patient plans to be on oral anticoagulation I would favor CT of the abdomen to reevaluate the presence or absence of her prior liver hematoma to obtain a new baseline.  Acute respiratory distress requiring supplemental oxygen. Sepsis secondary to pneumonia. Pleural effusion status postthoracentesis 09/07/2023 Currently being managed by primary team CCM was consulted today.  Breast CA w/ metastasis: Followed by medical oncology  For questions or updates, please contact [facility] Please consult [email or web address] for contact info under     Signed, Awilda Bogus [facility] Catharine  [facility]  Pager: [phone] Office: [phone] 09/08/2023

## 2023-09-08 NOTE — TOC Initial Note (Signed)
 Transition of Care [facility]) - Initial/Assessment Note    Patient Details  Name: Sara Barnes MRN: [id number] Date of Birth: 12-Apr-1941  Transition of Care [facility]) CM/SW Contact:    Delilah Fend, LCSW Phone Number: 09/08/2023, 3:31 PM  Clinical Narrative:                  Met with pt and sister this afternoon to introduce CSW role and review dc planning needs.  Pt engaged, however, very fatigued and struggling to make voice heard.  Pt agreeable with sister to assist in intake.  Both confirm that pt was admitted here from [facility] where she had been since 4/29 for rehab following hip fx.  Note that pt was making very good progress there and they felt she was getting close to being ready to return home.  Pt does live alone with sister living "around the corner" and son in [place].  Both very supportive.  Pt notes that her home is 2 levels with several stairs to negotiate.  Pt notes that her primary goal is to return home, however, aware that she is now much weaker due to her PNA.  We discussed possible need to resume SNF rehab at discharge if she is not safe to dc directly home.  Again, both agree that she is significantly weaker right now.  Pt/ sister note that they may be agreeable to SNF rehab again, however, do NOT want pt to return to [facility] [place]. Encouraged both that we will focus on medical improvements right now and then have pt work with PT here to determine abilities and make dc plan decisions.  TOC will continue to follow.  Expected Discharge Plan: Skilled Nursing Facility (vs. home with [facility]) Barriers to Discharge: Continued Medical Work up   Patient Goals and CMS Choice Patient states their goals for this hospitalization and ongoing recovery are:: return home          Expected Discharge Plan and Services In-house Referral: Clinical Social Work     Living arrangements for the past 2 months: Single Family Home (*at [facility] SN[facility] for ~ 2 weeks just prior to this admission for  purpose of rehab)                                      Prior Living Arrangements/Services Living arrangements for the past 2 months: Single Family Home (*at [facility] SNF for ~ 2 weeks just prior to this admission for purpose of rehab) Lives with:: Self Patient language and need for interpreter reviewed:: Yes Do you feel safe going back to the place where you live?: Yes      Need for Family Participation in Patient Care: Yes (Comment) Care giver support system in place?: Yes (comment)   Criminal Activity/Legal Involvement Pertinent to Current Situation/Hospitalization: No - Comment as needed  Activities of Daily Living      Permission Sought/Granted Permission sought to share information with : Family Supports Permission granted to share information with : Yes, Verbal Permission Granted  Share Information with NAME: son, Emilea Konop @ [phone] and sister, Dal Dubin @ [phone]           Emotional Assessment Appearance:: Appears stated age Attitude/Demeanor/Rapport: Gracious, Engaged Affect (typically observed): Pleasant Orientation: : Oriented to Self, Oriented to Place, Oriented to  Time, Oriented to Situation Alcohol / Substance Use: Not Applicable Psych Involvement: No (comment)  Admission  diagnosis:  Respiratory distress [R06.03] Acute respiratory failure with hypoxia (HCC) [J96.01] Atrial fibrillation with RVR (HCC) [I48.91] Pneumonia of right upper lobe due to infectious organism [J18.9] Patient Active Problem List   Diagnosis Date Noted   Allergic reaction to contrast 09/07/2023   Acute metabolic encephalopathy 09/07/2023   Acute hypoxic respiratory failure (HCC) 09/07/2023   History of hip fracture 09/07/2023   CKD (chronic kidney disease), stage II 09/07/2023   Respiratory distress 09/07/2023   Acute respiratory failure with hypoxia (HCC) 09/07/2023   Atrial fibrillation with RVR (HCC) 09/07/2023   Closed left hip fracture (HCC) 08/14/2023    Anxiety about health 06/12/2022   Liver mass    Breast cancer (HCC)    Pleural effusion    Status post thoracentesis    Malnutrition of moderate degree 05/10/2021   Clostridium difficile colitis    Intractable vomiting with nausea 04/19/2021   Left facial pain 11/15/2020   Chronic pansinusitis 11/12/2020   Primary insomnia 10/17/2020   Osteopenia of neck of left femur 03/26/2020   Preventative health care 03/26/2020   Closed fracture of shaft of right fibula with routine healing 02/02/2020   Hyponatremia 01/23/2020   Urinary frequency 01/23/2020   Dysphagia 01/23/2020   Acute pansinusitis 01/08/2018   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 06/02/2017 05/28/2017   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 05/21/2017   Right ear pain 05/19/2017   Dizziness 05/19/2017   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 03/24/2017   Chronic diastolic CHF (congestive heart failure) (HCC) 08/05/2016   Paroxysmal atrial fibrillation with RVR (HCC) 06/24/2016   Chest discomfort 06/21/2016   Nausea and vomiting    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   Small bowel obstruction (HCC)    Ileus (HCC)    Generalized anxiety disorder 02/14/2016   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   Hypokalemia    Fever    Crohn's disease of colon with complication (HCC)    Other depression due to general medical condition    Difficulty in walking, not elsewhere classified    Acute blood loss anemia    Absolute anemia    Left upper quadrant pain    Abdominal pain 01/07/2016   GI bleed 01/07/2016   IBS (irritable bowel syndrome)    Tenesmus    Ulcerative pancolitis with rectal bleeding (HCC)    Diarrhea 12/24/2015   Crohn disease (HCC) 12/24/2015   Dehydration    Ulcerative pancolitis with abscess (HCC)    Sepsis due to pneumonia (HCC) 04/19/2015   Rectal pain    Leucocytosis    Cancer of lower-inner quadrant of left female breast (HCC)  11/30/2014   Perirectal abscess 09/29/2013   History of Crohn's disease 08/15/2006   Vitamin B12 deficiency 08/12/2006   Anxiety and depression 08/12/2006   PCP:  Estill Hemming, DO Pharmacy:   [id number] [place],  - [address] AT [facility] [address] [facility] [address] [place] [address] Phone: [phone] Fax: [phone]     Social Drivers of Health (SDOH) Social History: SDOH Screenings   Food Insecurity: No Food Insecurity (09/08/2023)  Housing: Low Risk  (09/08/2023)  Transportation Needs: No Transportation Needs (09/08/2023)  Utilities: Not At Risk (09/08/2023)  Alcohol Screen: Low Risk  (03/26/2021)  Depression (PHQ2-9): Low Risk  (03/28/2022)  Financial Resource Strain: Low Risk  (03/26/2021)  Physical Activity: Sufficiently Active (03/26/2021)  Social Connections: Moderately Isolated (  09/08/2023)  Stress: No Stress Concern Present (03/26/2021)  Tobacco Use: Low Risk  (09/07/2023)   SDOH Interventions:     Readmission Risk Interventions    09/08/2023    3:24 PM  Readmission Risk Prevention Plan  Transportation Screening Complete  PCP or Specialist Appt within 3-5 Days Complete  HRI or Home Care Consult Complete  Social Work Consult for Recovery Care Planning/Counseling Complete  Medication Review Oceanographer) Complete

## 2023-09-08 NOTE — Hospital Course (Addendum)
 HPI: Sara Barnes is a 82 y.o. 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   During discharge from the hospital patient being placed on IV Lovenox  40 mg daily for DVT prophylaxis.       ED Course:  At presentation to ED patient found tachycardic heart rate 162, hypertensive, tachypneic and O2 sat 100% on 8 L oxygen.  Currently requiring BiPAP. ED ED O2 sat dropped to 80% on room air.  Initially placed on 3 L oxygen however requiring more oxygen so patient has been placed on BiPAP.  In the ED patient has been started on Cardizem  drip and IV heparin  drip.   For the management of sepsis patient received 2 L of LR bolus, azithromycin  and ceftriaxone .   Dr. Kermit Ped reported that even though patient has been pretreated with Benadryl  and   Of note, chart review previous CT abdomen pelvis from 01/16/2023 no evidence of hepatic hematoma anymore stable hepatic and osseous metastatic disease progression.   Given patient has allergic reaction with IV contrast discussed case with PCCM Dr. Charon Copper who recommended to give IV Decadron /Solu-Medrol , continue IV Benadryl  and IV famotidine .  History of allergic reaction/hives with prednisone .  Discussed with PCCM reviewed and clarified that it is not uncommon that patient is allergic to prednisone  however  it is safe to give Decadron  or Solu-Medrol . Also unable to give epinephrine  shot in the setting of A-fib RVR currently on Cardizem  drip.   Discussed case with cardiology Dr.Bandarage who recommended continue the Cardizem  drip and heparin  drip.  Cardiology will see patient in the daytime.   Hospitalist has been consulted for further management of allergic reaction with contrast, A-fib RVR, sepsis due to pneumonia, acute metabolic encephalopathy and acute hypoxic respiratory failure.  Significant Events: Admitted 09/06/2023 for respiratory distress   Admission Labs: Na 130, K 2.8, CO2 of 23, BUN 7, Scr 0.8. glu 136 WBC 6.0, HgB 11.0, plt 278 INR 1.0 Lactic 2.1 Covid/RSV/flu negative BNP 531  Admission Imaging Studies: CXR Marked severity left basilar atelectasis and/or infiltrate. 2. Small left pleural effusion CTPA No evidence of pulmonary embolism. 2. Small patchy right apical and anterior right upper lobe infiltrates. 3. Small right pleural effusion with a moderate size left pleural effusion. 4. Small, stable anterior mediastinal lymph node which corresponds to a hypermetabolic focus seen on the prior nuclear medicine PET/CT. 5. Findings consistent with osseous metastatic disease, as described above. 6. Small hiatal hernia. 7. Aortic atherosclerosis.  Antibiotic Therapy: Anti-infectives (From admission, onward)    Start     Dose/Rate Route Frequency Ordered Stop   09/08/23 0800  Vancomycin  (VANCOCIN ) 1,250 mg in sodium chloride  0.9 % 250 mL IVPB  Status:  Discontinued        1,250 mg 166.7 mL/hr over 90 Minutes Intravenous Every 24 hours 09/07/23 0501 09/08/23 0920   09/07/23 0500  ceFEPIme  (MAXIPIME ) 2 g in sodium chloride  0.9 % 100 mL IVPB  Status:  Discontinued  2 g 200 mL/hr over 30 Minutes Intravenous Every 12 hours 09/07/23 0452 09/09/23 1152   09/07/23 0500  vancomycin  (VANCOREADY) IVPB 1500 mg/300 mL        1,500 mg 150 mL/hr over 120 Minutes Intravenous  Once 09/07/23  0452 09/07/23 1040   09/06/23 2300  cefTRIAXone  (ROCEPHIN ) 1 g in sodium chloride  0.9 % 100 mL IVPB        1 g 200 mL/hr over 30 Minutes Intravenous  Once 09/06/23 2246 09/06/23 2344   09/06/23 2300  azithromycin  (ZITHROMAX ) 500 mg in sodium chloride  0.9 % 250 mL IVPB        500 mg 250 mL/hr over 60 Minutes Intravenous  Once 09/06/23 2246 09/07/23 0044       Procedures: 09-08-2023 thoracentesis  Consultants: Cardiology Palliative care PCCM

## 2023-09-08 NOTE — Progress Notes (Signed)
 PHARMACY - ANTICOAGULATION CONSULT NOTE  Pharmacy Consult for Heparin  Indication: atrial fibrillation  Allergies  Allergen Reactions   Nsaids Other (See Comments)    Crohn's disease = NO NSAIDs   Codeine Nausea And Vomiting   Iodinated Contrast Media     Elevated BP,Itchy skin. Face red in color   Mesalamine  Nausea And Vomiting   Omnipaque  [Iohexol ] Itching    Itching and flushed/red face   Oxycodone  Itching and Rash   Albuterol      Atrial fibrillation RVR   Contrast Allergy Premed Pack [Prednisone  & Diphenhydramine ] Hives    Hives, rash, wheezing and shortness of breath   Lactose Intolerance (Gi) Diarrhea   Norco [Hydrocodone -Acetaminophen ] Nausea Only   Other Itching    PERFUMED LOTIONS  Cant have MRI due to ear implant    Sulfa Antibiotics Rash    Patient Measurements: Height: 5\' 6"  (167.6 cm) Weight: 78.9 kg (174 lb) IBW/kg (Calculated) : 59.3 HEPARIN  DW (KG): 75.6  Vital Signs: Temp: 97.6 F (36.4 C) (05/13 0423) Temp Source: Oral (05/13 0423) BP: 153/69 (05/13 0600) Pulse Rate: 67 (05/13 0600)  Labs: Recent Labs    09/06/23 2239 09/07/23 0640 09/07/23 0930 09/07/23 1723 09/08/23 0522  HGB 11.0* 10.5* 10.3*  --  9.8*  HCT 34.2* 32.8* 32.6*  --  30.4*  PLT 278 264 269  --  287  LABPROT 13.7  --   --   --   --   INR 1.0  --   --   --   --   HEPARINUNFRC  --   --  0.72* 0.60 0.84*  CREATININE 0.80 0.83  --   --   --     Estimated Creatinine Clearance: 56.3 mL/min (by C-G formula based on SCr of 0.83 mg/dL).  Medications:  Lovenox  40mg  sq daily - LD on 5/9  Assessment:  83 yr female admitted on 5/11 with AFib with RVR.  History of breast cancer.  Discharged 08/25/23 s/p repair of hip fracture.  Has been on Lovenox  40mg  sq daily for VTE prophylaxis CTAngio 5/12 negative for PE  Today, 09/08/2023 Heparin  level increased to 0.84, supra-therapeutic on heparin  1100 units/hr CBC: Hgb down to 9.8, Plt WNL No bleeding or complications reported by  RN  Goal of Therapy:  Heparin  level 0.3-0.7 units/ml Monitor platelets by anticoagulation protocol: Yes   Plan:  Decrease to heparin  IV infusion at 950 units/hr Heparin  level 8 hours after rate change Daily heparil level & CBC Monitor for signs & symptoms of bleeding   Kendall Pauls PharmD, BCPS [facility] main pharmacy [phone] 09/08/2023 7:29 AM

## 2023-09-08 NOTE — Progress Notes (Signed)
 Daily Progress Note   Patient Name: Sara Barnes       Date: 09/08/2023 DOB: 15-Jan-1941  Age: 83 y.o. MRN#: [id number] Attending Physician: Lorita Rosa, MD Primary Care Physician: Crecencio Dodge, Candida Chalk, DO Admit Date: 09/06/2023  Reason for Consultation/Follow-up: Establishing goals of care  Patient Profile/HPI   83 y.o. 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    Patient required BiPAP en route to ED.  Continues to require BiPAP this morning and admitted for acute hypoxic respiratory failure and sepsis due to pneumonia, A-fib with RVR.  She also had a allergic response to contrast during workup, requiring second dose of IV Solu-Medrol , Benadryl  and famotidine  in the ED.   She is being treated for sepsis due to pneumonia which is life threatening. Aaron Aas    PMT has been consulted to assist with goals of care conversation in the context of her metastatic breast cancer.  Subjective: Chart reviewed including labs, progress notes, imaging from this and previous encounters.  Reviewed note from Oncology- functional status declined- will need improvement in functional status before further chemotherapy is possible. Very high risk for further decline and inability to receive further treatment. Hospice was offered by Oncology, but patient and sister declined.  Labs reviewed- WBC elevated today to 10.8- up from 3.9 yesterday.  On eval, patient awake and alert.  She does not have any questions.  Sister at bedside- stated that they did not want to discuss further palliative conversations. Assurance given that Palliative is available to aleve stress, not cause stress, and provide extra support.  Called patient's son. He also declined further discussion with Palliative and does not want outpatient Palliative referral.     Physical Exam Vitals and nursing note reviewed.  Constitutional:      General: She is not in acute distress. Cardiovascular:     Rate and Rhythm: Normal rate.  Pulmonary:     Effort: Pulmonary effort is normal.  Neurological:     Mental Status: She is alert.  Comments: Hard of hearing             Vital Signs: BP 130/80 (BP Location: Left Arm)   Pulse 61   Temp 97.9 F (36.6 C) (Oral)   Resp (!) 21   Ht 5\' 6"  (1.676 m)   Wt 78.9 kg   SpO2 99%   BMI 28.08 kg/m  SpO2: SpO2: 99 % O2 Device: O2 Device: Nasal Cannula O2 Flow Rate: O2 Flow Rate (L/min): 4 L/min  Intake/output summary:  Intake/Output Summary (Last 24 hours) at 09/08/2023 1203 Last data filed at 09/08/2023 0935 Gross per 24 hour  Intake 967.6 ml  Output 1900 ml  Net -932.4 ml   LBM:   Baseline Weight: Weight: 78.9 kg Most recent weight: Weight: 78.9 kg        Patient Active Problem List   Diagnosis Date Noted   Allergic reaction to contrast 09/07/2023   Acute metabolic encephalopathy 09/07/2023   Acute hypoxic respiratory failure (HCC) 09/07/2023   History of hip fracture 09/07/2023   CKD (chronic kidney disease), stage II 09/07/2023   Respiratory distress 09/07/2023   Acute respiratory failure with hypoxia (HCC) 09/07/2023   Atrial fibrillation with RVR (HCC) 09/07/2023   Closed left hip fracture (HCC) 08/14/2023   Anxiety about health 06/12/2022   Liver mass    Breast cancer (HCC)    Pleural effusion    Status post thoracentesis    Malnutrition of moderate degree 05/10/2021   Clostridium difficile colitis    Intractable vomiting with  nausea 04/19/2021   Left facial pain 11/15/2020   Chronic pansinusitis 11/12/2020   Primary insomnia 10/17/2020   Osteopenia of neck of left femur 03/26/2020   Preventative health care 03/26/2020   Closed fracture of shaft of right fibula with routine healing 02/02/2020   Hyponatremia 01/23/2020   Urinary frequency 01/23/2020   Dysphagia 01/23/2020   Acute pansinusitis 01/08/2018   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 06/02/2017 05/28/2017   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 05/21/2017   Right ear pain 05/19/2017   Dizziness 05/19/2017   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 03/24/2017   Chronic diastolic CHF (congestive heart failure) (HCC) 08/05/2016   Paroxysmal atrial fibrillation with RVR (HCC) 06/24/2016   Chest discomfort 06/21/2016   Nausea and vomiting    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   Small bowel obstruction (HCC)    Ileus (HCC)    Generalized anxiety disorder 02/14/2016   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   Hypokalemia    Fever    Crohn's disease of colon with complication (HCC)    Other depression due to general medical condition    Difficulty in walking, not elsewhere classified    Acute blood loss anemia    Absolute anemia    Left upper quadrant pain    Abdominal pain 01/07/2016   GI bleed 01/07/2016   IBS (irritable bowel syndrome)    Tenesmus    Ulcerative pancolitis with rectal bleeding (HCC)    Diarrhea 12/24/2015   Crohn disease (HCC) 12/24/2015   Dehydration    Ulcerative pancolitis with abscess (HCC)    Sepsis due to pneumonia (HCC) 04/19/2015   Rectal pain    Leucocytosis    Cancer of lower-inner quadrant of left female breast (HCC) 11/30/2014   Perirectal abscess 09/29/2013   History of Crohn's disease 08/15/2006   Vitamin B12 deficiency 08/12/2006   Anxiety and depression 08/12/2006    Palliative  Care Assessment & Plan     Assessment/Recommendations/Plan  Continue current interventions- full scope, full code Family declines any further Palliative involvement- will follow peripherally- please call if family changes mind and would like further discussions with Palliative team   Code Status:   Code Status: Full Code   Prognosis:  < 6 months  Discharge Planning: To Be Determined  Care plan was discussed with patient, patient's sister, and son  Thank you for allowing the Palliative Medicine Team to assist in the care of this patient.  Total time:  45 minutes Prolonged billing:  Time includes:   Preparing to see the patient (e.g., review of tests) Obtaining and/or reviewing separately obtained history Performing a medically necessary appropriate examination and/or evaluation Counseling and educating the patient/family/caregiver Ordering medications, tests, or procedures Referring and communicating with other health care professionals (when not reported separately) Documenting clinical information in the electronic or other health record Independently interpreting results (not reported separately) and communicating results to the patient/family/caregiver Care coordination (not reported separately) Clinical documentation  Micki Alas, AGNP-C Palliative Medicine   Please contact Palliative Medicine Team phone at [phone] for questions and concerns.

## 2023-09-09 DIAGNOSIS — J123 Human metapneumovirus pneumonia: Secondary | ICD-10-CM

## 2023-09-09 DIAGNOSIS — J90 Pleural effusion, not elsewhere classified: Secondary | ICD-10-CM | POA: Diagnosis not present

## 2023-09-09 DIAGNOSIS — C50919 Malignant neoplasm of unspecified site of unspecified female breast: Secondary | ICD-10-CM

## 2023-09-09 DIAGNOSIS — J189 Pneumonia, unspecified organism: Secondary | ICD-10-CM | POA: Diagnosis not present

## 2023-09-09 DIAGNOSIS — R0603 Acute respiratory distress: Secondary | ICD-10-CM | POA: Diagnosis not present

## 2023-09-09 DIAGNOSIS — C50912 Malignant neoplasm of unspecified site of left female breast: Secondary | ICD-10-CM | POA: Diagnosis not present

## 2023-09-09 DIAGNOSIS — I480 Paroxysmal atrial fibrillation: Secondary | ICD-10-CM | POA: Diagnosis not present

## 2023-09-09 DIAGNOSIS — J9601 Acute respiratory failure with hypoxia: Secondary | ICD-10-CM | POA: Diagnosis not present

## 2023-09-09 LAB — BODY FLUID CELL COUNT WITH DIFFERENTIAL
Eos, Fluid: 0 %
Lymphs, Fluid: 76 %
Monocyte-Macrophage-Serous Fluid: 22 % — ABNORMAL LOW (ref 50–90)
Neutrophil Count, Fluid: 2 % (ref 0–25)
Total Nucleated Cell Count, Fluid: 509 uL (ref 0–1000)

## 2023-09-09 LAB — CBC
HCT: 33.5 % — ABNORMAL LOW (ref 36.0–46.0)
Hemoglobin: 10.7 g/dL — ABNORMAL LOW (ref 12.0–15.0)
MCH: 30.5 pg (ref 26.0–34.0)
MCHC: 31.9 g/dL (ref 30.0–36.0)
MCV: 95.4 fL (ref 80.0–100.0)
Platelets: 266 10*3/uL (ref 150–400)
RBC: 3.51 MIL/uL — ABNORMAL LOW (ref 3.87–5.11)
RDW: 12.3 % (ref 11.5–15.5)
WBC: 15.5 10*3/uL — ABNORMAL HIGH (ref 4.0–10.5)
nRBC: 0 % (ref 0.0–0.2)

## 2023-09-09 LAB — PROTEIN, PLEURAL OR PERITONEAL FLUID: Total protein, fluid: <3 g/dL

## 2023-09-09 LAB — GLUCOSE, CAPILLARY
Glucose-Capillary: 145 mg/dL — ABNORMAL HIGH (ref 70–99)
Glucose-Capillary: 157 mg/dL — ABNORMAL HIGH (ref 70–99)
Glucose-Capillary: 138 mg/dL — ABNORMAL HIGH (ref 70–99)

## 2023-09-09 LAB — BASIC METABOLIC PANEL WITH GFR
Anion gap: 11 (ref 5–15)
BUN: 26 mg/dL — ABNORMAL HIGH (ref 8–23)
CO2: 25 mmol/L (ref 22–32)
Calcium: 8.5 mg/dL — ABNORMAL LOW (ref 8.9–10.3)
Chloride: 92 mmol/L — ABNORMAL LOW (ref 98–111)
Creatinine, Ser: 0.9 mg/dL (ref 0.44–1.00)
GFR, Estimated: >60 mL/min (ref >60)
Glucose, Bld: 138 mg/dL — ABNORMAL HIGH (ref 70–99)
Potassium: 3.5 mmol/L (ref 3.5–5.1)
Sodium: 128 mmol/L — ABNORMAL LOW (ref 135–145)

## 2023-09-09 LAB — LACTATE DEHYDROGENASE, PLEURAL OR PERITONEAL FLUID: LD, Fluid: 65 U/L — ABNORMAL HIGH (ref 3–23)

## 2023-09-09 LAB — GLUCOSE, PLEURAL OR PERITONEAL FLUID: Glucose, Fluid: 133 mg/dL

## 2023-09-09 LAB — HEPARIN LEVEL (UNFRACTIONATED)
Heparin Unfractionated: 0.28 [IU]/mL — ABNORMAL LOW (ref 0.30–0.70)
Heparin Unfractionated: 0.43 [IU]/mL (ref 0.30–0.70)

## 2023-09-09 LAB — MAGNESIUM: Magnesium: 2.2 mg/dL (ref 1.7–2.4)

## 2023-09-09 MED ORDER — DILTIAZEM HCL-DEXTROSE 125-5 MG/125ML-% IV SOLN (PREMIX)
5.0000 mg/h | INTRAVENOUS | Status: DC
  Administered 2023-09-09: 5 mg/h via INTRAVENOUS
  Administered 2023-09-09: 15 mg/h via INTRAVENOUS
  Administered 2023-09-10 – 2023-09-11 (×2): 7.5 mg/h via INTRAVENOUS
  Filled 2023-09-09 (×7): qty 125

## 2023-09-09 MED ORDER — ACETAMINOPHEN 325 MG PO TABS
650.0000 mg | ORAL_TABLET | Freq: Four times a day (QID) | ORAL | Status: DC | PRN
  Administered 2023-09-09 – 2023-09-18 (×6): 650 mg via ORAL
  Filled 2023-09-09 (×6): qty 2

## 2023-09-09 MED ORDER — DILTIAZEM LOAD VIA INFUSION
15.0000 mg | Freq: Once | INTRAVENOUS | Status: AC
  Administered 2023-09-09: 15 mg via INTRAVENOUS
  Filled 2023-09-09: qty 15

## 2023-09-09 MED ORDER — OXYCODONE HCL 5 MG PO TABS
10.0000 mg | ORAL_TABLET | Freq: Four times a day (QID) | ORAL | Status: DC | PRN
  Administered 2023-09-09 – 2023-09-14 (×13): 10 mg via ORAL
  Filled 2023-09-09 (×12): qty 2

## 2023-09-09 MED ORDER — DILTIAZEM HCL 60 MG PO TABS
60.0000 mg | ORAL_TABLET | Freq: Three times a day (TID) | ORAL | Status: DC
  Administered 2023-09-09 – 2023-09-10 (×3): 60 mg via ORAL
  Filled 2023-09-09 (×3): qty 1

## 2023-09-09 NOTE — Progress Notes (Signed)
 PT Cancellation Note  Patient Details Name: Sara Barnes MRN: [id number] DOB: January 28, 1941   Cancelled Treatment:    Reason Eval/Treat Not Completed: Medical issues which prohibited therapy  [name] PT Acute Rehabilitation Services Office [phone]   [name] 09/09/2023, 8:38 AM

## 2023-09-09 NOTE — Progress Notes (Signed)
 Rounding Note    Patient Name: Sara Barnes Date of Encounter: 09/09/2023  [facility] Cardiologist: Olinda Bertrand, DO (new)  Chief Complaint:  Chief Complaint  Patient presents with   Respiratory Distress  Reason of consult: Atrial fibrillation with rapid ventricular rate  Subjective   Attended by sister at bedside. Denies anginal chest pain or heart failure symptoms. Remains in A-fib with RVR restarted on Cardizem drip. Remains on IV heparin  drip Metapneumovirus positive on respiratory panel  Inpatient Medications    Scheduled Meds:  arformoterol  15 mcg Nebulization BID   Chlorhexidine  Gluconate Cloth  6 each Topical Daily   vitamin D3  1,000 Units Oral Daily   FLUoxetine   20 mg Oral Daily   fluticasone   2 spray Each Nare Daily   guaiFENesin  600 mg Oral BID   revefenacin  175 mcg Nebulization Daily   sodium chloride  flush  10-40 mL Intracatheter Q12H   sodium chloride  flush  3 mL Intravenous Q12H   Continuous Infusions:  diltiazem (CARDIZEM) infusion 15 mg/hr (09/09/23 1249)   heparin  1,050 Units/hr (09/09/23 1249)   PRN Meds: acetaminophen , ALPRAZolam , famotidine , levalbuterol , Muscle Rub, oxyCODONE , sodium chloride  flush, zolpidem    Vital Signs    Vitals:   09/09/23 1245 09/09/23 1300 09/09/23 1400 09/09/23 1415  BP: 107/66 (!) 146/78    Pulse: (!) 109 (!) 146 (!) 119 98  Resp: (!) 24 (!) 23 (!) 30 (!) 24  Temp:      TempSrc:      SpO2: 92% 92% 94% 95%  Weight:      Height:        Intake/Output Summary (Last 24 hours) at 09/09/2023 1454 Last data filed at 09/09/2023 1249 Gross per 24 hour  Intake 501.65 ml  Output 2600 ml  Net -2098.35 ml      09/06/2023   10:27 PM 08/25/2023    5:00 AM 08/23/2023    4:54 AM  Last 3 Weights  Weight (lbs) 174 lb 174 lb 13.2 oz 175 lb 7.8 oz  Weight (kg) 78.926 kg 79.3 kg 79.6 kg      Telemetry    Atrial fibrillation with controlled ventricular rate- Personally Reviewed  ECG    No new tracing  - Personally Reviewed  Physical Exam   Constitutional: No distress. She appears chronically ill.  hemodynamically stable  Neck: No JVD present.  Cardiovascular: S1 normal and S2 normal. An irregularly irregular rhythm present. Tachycardia present.  No murmurs rubs or gallops appreciated secondary to tachycardia  Pulmonary/Chest: No stridor. She has no wheezes. She has no rales.  Rhonchi noted bilateral. Left partial mastectomy  Musculoskeletal:        General: No edema.     Cervical back: Neck supple.  Neurological: She is alert and oriented to person, place, and time.  Skin: Skin is warm.  Ecchymosis    Labs    High Sensitivity Troponin:  No results for input(s): "TROPONINIHS" in the last 720 hours.   Chemistry Recent Labs  Lab 09/06/23 2239 09/07/23 0532 09/07/23 0640 09/08/23 0810 09/08/23 1613 09/09/23 0543  NA 130*  --  129* 126*  --  128*  K 2.8*  --  3.8 4.4  --  3.5  CL 97*  --  98 97*  --  92*  CO2 23  --  21* 22  --  25  GLUCOSE 136*  --  166* 126*  --  138*  BUN 7*  --  7* 15  --  26*  CREATININE 0.80  --  0.83 0.74  --  0.90  CALCIUM  8.7*  --  8.6* 8.2*  --  8.5*  MG  --  1.6*  --   --   --  2.2  PROT 6.5  --  6.3*  --  6.9  --   ALBUMIN  3.4*  --  3.1*  --   --   --   AST 16  --  24  --   --   --   ALT 11  --  14  --   --   --   ALKPHOS 142*  --  122  --   --   --   BILITOT 0.6  --  0.6  --   --   --   GFRNONAA >60  --  >60 >60  --  >60  ANIONGAP 10  --  10 7  --  11    Lipids No results for input(s): "CHOL", "TRIG", "HDL", "LABVLDL", "LDLCALC", "CHOLHDL" in the last 168 hours.  Hematology Recent Labs  Lab 09/07/23 0930 09/08/23 0522 09/09/23 0543  WBC 3.9* 10.8* 15.5*  RBC 3.34* 3.14* 3.51*  HGB 10.3* 9.8* 10.7*  HCT 32.6* 30.4* 33.5*  MCV 97.6 96.8 95.4  MCH 30.8 31.2 30.5  MCHC 31.6 32.2 31.9  RDW 11.9 12.3 12.3  PLT 269 287 266   Thyroid  No results for input(s): "TSH", "FREET4" in the last 168 hours.  BNP Recent Labs  Lab  09/07/23 0532  BNP 531.6*    DDimer No results for input(s): "DDIMER" in the last 168 hours.   Radiology    CTA chest PE protocol 09/07/2023 1. No evidence of pulmonary embolism. 2. Small patchy right apical and anterior right upper lobe infiltrates. 3. Small right pleural effusion with a moderate size left pleural effusion. 4. Small, stable anterior mediastinal lymph node which corresponds to a hypermetabolic focus seen on the prior nuclear medicine PET/CT. 5. Findings consistent with osseous metastatic disease, as described above. 6. Small hiatal hernia. 7. Aortic atherosclerosis.   Cardiac Studies   Echocardiogram 09/07/2023 LVEF 65 to 70%. No regional wall motion abnormalities. Right ventricular size and function normal. Estimated RVSP 36 mmHg. Left atrium mildly dilated. No mitral stenosis. Trivial AR. Estimated RAP 15 mmHg  Patient Profile     83 y.o. female with a hx of metastatic breast cancer, Crohn's disease s/p ileostomy with reversal, PAF not on anticoagulation, and recent hip fracture s/p repair who is being seen for the evaluation of Afib RVR at the request of Dr. Murrel Arnt.   Assessment & Plan    Atrial fibrillation with rapid ventricular rate Paroxysmal A-fib Back in atrial fibrillation with rapid ventricular rate earlier this morning and started on Cardizem drip.  Ventricular rates are now better controlled. On IV heparin  for thromboembolic prophylaxis Start Cardizem 60 mg p.o. 3 times daily with holding parameters. RN informed to start weaning off Cardizem drip Click Here to Calculate/Change CHADS2VASc Score The patient's CHADS2-VASc score is 5, indicating a 7.2% annual risk of stroke.  CHF History: No HTN History: Yes Diabetes History: No Stroke History: No Vascular Disease History: Yes  Based on her CHA2DS2-VASc ideally she should be on anticoagulation to prevent thromboembolic prophylaxis. However, clinically benefits and risk have to be taken into  consideration and patient needs to participate in a shared decision-making process.  Next of kin is son and sister has also been quite supportive during the process. Family still has not decided if  patient would like to proceed with anticoagulation posthospitalization. Currently patient was placed on IV heparin  given concern that she had a history of hematoma status post liver biopsy.  If patient plans to be on oral anticoagulation I would favor CT of the abdomen to reevaluate the presence or absence of her prior liver hematoma to obtain a new baseline.  Acute respiratory distress requiring supplemental oxygen. Sepsis secondary to pneumonia. Metapneumovirus Pleural effusion status postthoracentesis 09/07/2023 Currently being managed by primary team CCM was consulted today.  Breast CA w/ metastasis: Followed by medical oncology  Plan of care discussed with attending physician.  For questions or updates, please contact [facility] Please consult [email or web address] for contact info under     Signed, Awilda Bogus [facility]  [facility]  Pager: [phone] Office: [phone] 09/09/2023

## 2023-09-09 NOTE — Progress Notes (Signed)
 PROGRESS NOTE    Sara Barnes  ZOX:[id number] DOB: May 26, 1940 DOA: 09/06/2023 PCP: Estill Hemming, DO    Brief Narrative:  [phone] with h/o Crohn's s/p ileostomy, metastatic breast CA, afib not on AC due to hepatic hematoma, anxiety/depression, and recent hip repair currently in rehab who presented on 5/11 with SOB. While in the ER, she experienced a contrast reaction, as well. She is on BIPAP and was given steroids, benadryl , H2 blocker. She was diagnosed with sepsis due to PNA and afib with RVR.     Assessment and Plan: Sepsis secondary to pneumonia with respiratory distress Patient with recent hospitalization for hip fracture, presenting from SNF rehab with 2 days of URI and worsening respiratory failure  Lowest documented O2 sats are 92% but she was significantly SOB and placed on BIPAP -> [place] O2 Respiratory panel shows metapneumovirus SIRS criteria with tachycardia, tachypnea; lactate of 4.1 indicating acute organ dysfunction CXR with marked severity L basilar atelectasis vs. Infiltrate CT with R/ L basilar atelectasis and small patchy R-sided infiltrates, likely multifocal PNA Also with midsternal lymphadenopathy concern for metastasis IV vancomycin  and cefepime given recent hospitalization BiPAP -> [place] O2-wean as able Continue nebulizers -PCCM following-status post thoracentesis with 800 cc removed   Contrast-induced allergic reaction -Treated   A-fib with RVR with h/o paroxysmal atrial fibrillation Developed RVR after administration of SoluMedrol/Abluterol with EMS Per chart review, patient has history of paroxysmal afib (last in 2018 during hospitalization); family denies knowledge of this Not on anticoagulation due to history of hepatic hematoma in 2022  (resolved) Also not on rate controlling medications Started on IV heparin  drip and IV Cardizem drip-restarted on 5/14 AM Cardiology consulted Echocardiogram unremarkable   Acute metabolic encephalopathy Appears to  have resolved   Anxiety Patient takes Xanax  and Ambien  at home   History of hip fracture Resume oxycodone  Presented from [facility] [place], there for rehab Reports that she has done great with rehab--is considering a different rehab or home with sister   History of metastatic breast cancer to skin, liver and bone Metastatic breast cancer to skin, liver and bone CT C/A/P with new and old lytic lesions and concern for hilar/mediastinal LN mets Consulted inpatient palliative care to discuss further goal of care with patient and family Dr. Maryalice Smaller consulted If her functional status does not recover, she will need to stop chemotherapy Hospice was offered but family refused Palliative care consulted but patient/family are declining palliative/hospice F/u with Dr. 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 at a different facility, sister to look into this  Objective: Vitals:   09/09/23 0400 09/09/23 0500 09/09/23 0600 09/09/23 0817  BP: (!) 142/62  (!) 162/114   Pulse: 86 92 (!) 128   Resp: (!) 26 (!) 27 (!) 23   Temp:      TempSrc:      SpO2: (!) 66% 100% (!) 78% 96%  Weight:      Height:  Intake/Output Summary (Last 24 hours) at 09/09/2023 0844 Last data filed at 09/09/2023 4098 Gross per 24 hour  Intake 649.12 ml  Output 2000 ml  Net -1350.88 ml   Filed Weights   09/06/23 2227  Weight: 78.9 kg    Examination:   General: Appearance:     Overweight female in  no acute distress     Lungs:   On nasal cannula, diminished, respirations unlabored  Heart:    Tachycardic.  An irregular   MS:   All extremities are intact.    Neurologic:   Awake, alert, oriented x 3. No apparent focal neurological           defect. -Very hard of hearing       Data Reviewed: I have personally reviewed following labs and imaging studies  CBC: Recent Labs  Lab 09/06/23 2239 09/07/23 0640 09/07/23 0930 09/08/23 0522 09/09/23 0543  WBC 6.0 3.3* 3.9* 10.8* 15.5*  NEUTROABS 3.7  --   --   --   --   HGB 11.0* 10.5* 10.3* 9.8* 10.7*  HCT 34.2* 32.8* 32.6* 30.4* 33.5*  MCV 96.6 95.9 97.6 96.8 95.4  PLT 278 264 269 287 266   Basic Metabolic Panel: Recent Labs  Lab 09/06/23 2239 09/07/23 0532 09/07/23 0640 09/08/23 0810 09/09/23 0543  NA 130*  --  129* 126* 128*  K 2.8*  --  3.8 4.4 3.5  CL 97*  --  98 97* 92*  CO2 23  --  21* 22 25  GLUCOSE 136*  --  166* 126* 138*  BUN 7*  --  7* 15 26*  CREATININE 0.80  --  0.83 0.74 0.90  CALCIUM  8.7*  --  8.6* 8.2* 8.5*  MG  --  1.6*  --   --  2.2   GFR: Estimated Creatinine Clearance: 51.9 mL/min (by C-G formula based on SCr of 0.9 mg/dL). Liver Function Tests: Recent Labs  Lab 09/06/23 2239 09/07/23 0640 09/08/23 1613  AST 16 24  --   ALT 11 14  --   ALKPHOS 142* 122  --   BILITOT 0.6 0.6  --   PROT 6.5 6.3* 6.9  ALBUMIN  3.4* 3.1*  --    No results for input(s): "LIPASE", "AMYLASE" in the last 168 hours. No results for input(s): "AMMONIA" in the last 168 hours. Coagulation Profile: Recent Labs  Lab 09/06/23 2239  INR 1.0   Cardiac Enzymes: No results for input(s): "CKTOTAL", "CKMB", "CKMBINDEX", "TROPONINI" in the last 168 hours. BNP (last 3 results) No results for input(s): "PROBNP" in the last 8760 hours. HbA1C: No results for input(s): "HGBA1C" in the last 72 hours. CBG: Recent Labs  Lab 09/08/23 0645 09/08/23 1147 09/08/23 1721 09/08/23 2355 09/09/23 0546  GLUCAP 145* 148* 146*  145* 145*   Lipid Profile: No results for input(s): "CHOL", "HDL", "LDLCALC", "TRIG", "CHOLHDL", "LDLDIRECT" in the last 72 hours. Thyroid  Function Tests: No results for input(s): "TSH", "T4TOTAL", "FREET4", "T3FREE", "THYROIDAB" in the last 72 hours. Anemia Panel: No results for input(s): "VITAMINB12", "FOLATE", "FERRITIN", "TIBC", "IRON", "RETICCTPCT" in the last 72 hours. Sepsis Labs: Recent Labs  Lab 09/06/23 2239 09/07/23 0407 09/07/23 0930 09/07/23 1723  LATICACIDVEN 2.1* 4.1* 2.8* 0.8    Recent Results (from the past 240 hours)  Blood Culture (routine x 2)     Status: None (Preliminary result)   Collection Time: 09/06/23 10:38 PM   Specimen: BLOOD  Result Value Ref Range Status   Specimen Description   Final  BLOOD SITE NOT SPECIFIED Performed at [facility], [address], [place], [place] [address]    Special Requests   Final    BOTTLES DRAWN AEROBIC AND ANAEROBIC Blood Culture results may not be optimal due to an inadequate volume of blood received in culture bottles Performed at [facility], [address] [place], [place] [address]    Culture   Final    NO GROWTH 1 DAY Performed at [facility], [address], [place], [place] [address]    Report Status PENDING  Incomplete  Resp panel by RT-PCR (RSV, Flu A&B, Covid) Anterior Nasal Swab     Status: None   Collection Time: 09/06/23 11:16 PM   Specimen: Anterior Nasal Swab  Result Value Ref Range Status   SARS Coronavirus 2 by RT PCR NEGATIVE NEGATIVE Final    Comment: (NOTE) SARS-CoV-2 target nucleic acids are NOT DETECTED.  The SARS-CoV-2 RNA is generally detectable in upper respiratory specimens during the acute phase of infection. The lowest concentration of SARS-CoV-2 viral copies this assay can detect is 138 copies/mL. A negative result does not preclude SARS-Cov-2 infection and should not be used as the sole basis for treatment or other patient  management decisions. A negative result may occur with  improper specimen collection/handling, submission of specimen other than nasopharyngeal swab, presence of viral mutation(s) within the areas targeted by this assay, and inadequate number of viral copies(<138 copies/mL). A negative result must be combined with clinical observations, patient history, and epidemiological information. The expected result is Negative.  Fact Sheet for Patients:  BloggerCourse.com  Fact Sheet for Healthcare Providers:  SeriousBroker.it  This test is no t yet approved or cleared by the [place]  FDA and  has been authorized for detection and/or diagnosis of SARS-CoV-2 by FDA under an Emergency Use Authorization (EUA). This EUA will remain  in effect (meaning this test can be used) for the duration of the COVID-19 declaration under Section 564(b)(1) of the Act, 21 U.S.C.section 360bbb-3(b)(1), unless the authorization is terminated  or revoked sooner.       Influenza A by PCR NEGATIVE NEGATIVE Final   Influenza B by PCR NEGATIVE NEGATIVE Final    Comment: (NOTE) The Xpert Xpress SARS-CoV-2/FLU/RSV plus assay is intended as an aid in the diagnosis of influenza from Nasopharyngeal swab specimens and should not be used as a sole basis for treatment. Nasal washings and aspirates are unacceptable for Xpert Xpress SARS-CoV-2/FLU/RSV testing.  Fact Sheet for Patients: BloggerCourse.com  Fact Sheet for Healthcare Providers: SeriousBroker.it  This test is not yet approved or cleared by the [place]  FDA and has been authorized for detection and/or diagnosis of SARS-CoV-2 by FDA under an Emergency Use Authorization (EUA). This EUA will remain in effect (meaning this test can be used) for the duration of the COVID-19 declaration under Section 564(b)(1) of the Act, 21 U.S.C. section 360bbb-3(b)(1),  unless the authorization is terminated or revoked.     Resp Syncytial Virus by PCR NEGATIVE NEGATIVE Final    Comment: (NOTE) Fact Sheet for Patients: BloggerCourse.com  Fact Sheet for Healthcare Providers: SeriousBroker.it  This test is not yet approved or cleared by the [place]  FDA and has been authorized for detection and/or diagnosis of SARS-CoV-2 by FDA under an Emergency Use Authorization (EUA). This EUA will remain in effect (meaning this test can be used) for the duration of the COVID-19 declaration under Section 564(b)(1) of the Act, 21 U.S.C. section 360bbb-3(b)(1), unless the authorization  is terminated or revoked.  Performed at [facility], [address] [place], [place] [address]   Blood Culture (routine x 2)     Status: None (Preliminary result)   Collection Time: 09/06/23 11:27 PM   Specimen: BLOOD RIGHT ARM  Result Value Ref Range Status   Specimen Description   Final    BLOOD RIGHT ARM Performed at [facility], [address] [place], [place] [address]    Special Requests   Final    BOTTLES DRAWN AEROBIC ONLY Blood Culture adequate volume Performed at [facility], [address] [place], [place] [address]    Culture   Final    NO GROWTH 1 DAY Performed at [facility], [address], [place], [place] [address]    Report Status PENDING  Incomplete  MRSA Next Gen by PCR, Nasal     Status: None   Collection Time: 09/07/23 12:30 PM   Specimen: Nasal Mucosa; Nasal Swab  Result Value Ref Range Status   MRSA by PCR Next Gen NOT DETECTED NOT DETECTED Final    Comment: (NOTE) The GeneXpert MRSA Assay (FDA approved for NASAL specimens only), is one component of a comprehensive MRSA colonization surveillance program. It is not intended to diagnose MRSA infection nor to guide or monitor treatment for MRSA infections. Test performance is not FDA  approved in patients less than 72 years old. Performed at [facility], [address], [place], [place] [address]   Respiratory (~20 pathogens) panel by PCR     Status: Abnormal   Collection Time: 09/08/23  3:49 PM   Specimen: Nasopharyngeal Swab; Respiratory  Result Value Ref Range Status   Adenovirus NOT DETECTED NOT DETECTED Final   Coronavirus 229E NOT DETECTED NOT DETECTED Final    Comment: (NOTE) The Coronavirus on the Respiratory Panel, DOES NOT test for the novel  Coronavirus (2019 nCoV)    Coronavirus HKU1 NOT DETECTED NOT DETECTED Final   Coronavirus NL63 NOT DETECTED NOT DETECTED Final   Coronavirus OC43 NOT DETECTED NOT DETECTED Final   Metapneumovirus DETECTED (A) NOT DETECTED Final   Rhinovirus / Enterovirus NOT DETECTED NOT DETECTED Final   Influenza A NOT DETECTED NOT DETECTED Final   Influenza B NOT DETECTED NOT DETECTED Final   Parainfluenza Virus 1 NOT DETECTED NOT DETECTED Final   Parainfluenza Virus 2 NOT DETECTED NOT DETECTED Final   Parainfluenza Virus 3 NOT DETECTED NOT DETECTED Final   Parainfluenza Virus 4 NOT DETECTED NOT DETECTED Final   Respiratory Syncytial Virus NOT DETECTED NOT DETECTED Final   Bordetella pertussis NOT DETECTED NOT DETECTED Final   Bordetella Parapertussis NOT DETECTED NOT DETECTED Final   Chlamydophila pneumoniae NOT DETECTED NOT DETECTED Final   Mycoplasma pneumoniae NOT DETECTED NOT DETECTED Final    Comment: Performed at [facility], [address], [place], [place] [address]         Radiology Studies: DG Chest Port 1 View Result Date: 09/08/2023 CLINICAL DATA:  Status post thoracentesis. EXAM: PORTABLE CHEST 1 VIEW COMPARISON:  Same day. FINDINGS: Stable cardiomediastinal silhouette. Right-sided PICC line is noted with tip in expected position of right atrium. No pneumothorax is noted status post thoracentesis. IMPRESSION: No pneumothorax status post thoracentesis. Electronically Signed   By: Rosalene Colon M.D.   On: 09/08/2023 17:01   DG CHEST PORT 1 VIEW Result Date: 09/08/2023 CLINICAL DATA:  [id number] SOB (shortness of breath) [id number]. EXAM: PORTABLE CHEST 1  VIEW COMPARISON:  09/06/2023. FINDINGS: Low lung volume.  Mild diffuse pulmonary vascular congestion. Re-demonstration of left retrocardiac airspace opacity obscuring the left hemidiaphragm, descending thoracic aorta and blunting the left lateral costophrenic angle, suggesting combination of left lung atelectasis and/or consolidation with pleural effusion. No significant interval change. There are new heterogeneous opacities overlying the right lower lung zone, which may represent combination of atelectasis and/or consolidation. There is subtle blunting of right lateral costophrenic angle, which may represent trace right pleural effusion. No pneumothorax on either side. Stable cardio-mediastinal silhouette. No acute osseous abnormalities. Right-sided PICC line is seen with its tip overlying the lower portion of superior vena cava. The soft tissues are within normal limits. IMPRESSION: 1. New heterogeneous opacities overlying the right lower lung zone, which may represent combination of atelectasis and/or consolidation. 2. Stable left retrocardiac opacity, as described above. Electronically Signed   By: Beula Brunswick M.D.   On: 09/08/2023 14:12   ECHOCARDIOGRAM COMPLETE Result Date: 09/07/2023    ECHOCARDIOGRAM REPORT   Patient Name:   Sara Barnes Date of Exam: 09/07/2023 Medical Rec #:  [id number]      Height:       66.0 in Accession #:    [id number]     Weight:       174.0 lb Date of Birth:  01-12-1941       BSA:          1.885 m Patient Age:    82 years       BP:           143/76 mmHg Patient Gender: F              HR:           92 bpm. Exam Location:  Inpatient Procedure: 2D Echo, Cardiac Doppler and Color Doppler (Both Spectral and Color            Flow Doppler were utilized during procedure). Indications:    I48.91* Unspeicified atrial  fibrillation  History:        Patient has prior history of Echocardiogram examinations, most                 recent 04/27/2021. Abnormal ECG, Arrythmias:Atrial Fibrillation;                 Signs/Symptoms:Bacteremia and Dizziness/Lightheadedness.  Sonographer:    Raynelle Callow RDCS Referring Phys: [id number] [facility]  Sonographer Comments: Suboptimal parasternal window. Image acquisition challenging due to breast implants. IMPRESSIONS  1. Left ventricular ejection fraction, by estimation, is 65 to 70%. The left ventricle has normal function. The left ventricle has no regional wall motion abnormalities. Left ventricular diastolic parameters are indeterminate.  2. Right ventricular systolic function is normal. The right ventricular size is normal. There is normal pulmonary artery systolic pressure. The estimated right ventricular systolic pressure is 35.8 mmHg.  3. Left atrial size was mildly dilated.  4. The mitral valve is normal in structure. Trivial mitral valve regurgitation. No evidence of mitral stenosis.  5. The aortic valve was not well visualized. Aortic valve regurgitation is trivial. No aortic stenosis is present.  6. The inferior vena cava is dilated in size with <50% respiratory variability, suggesting right atrial pressure of 15 mmHg.  7. The patient was in atrial fibrillation. FINDINGS  Left Ventricle: Left ventricular ejection fraction, by estimation, is 65 to 70%. The left ventricle has normal function. The left ventricle has no regional wall motion abnormalities. The left ventricular internal cavity size  was normal in size. There is  no left ventricular hypertrophy. Left ventricular diastolic parameters are indeterminate. Right Ventricle: The right ventricular size is normal. No increase in right ventricular wall thickness. Right ventricular systolic function is normal. There is normal pulmonary artery systolic pressure. The tricuspid regurgitant velocity is 2.28 m/s, and  with an assumed right  atrial pressure of 15 mmHg, the estimated right ventricular systolic pressure is 35.8 mmHg. Left Atrium: Left atrial size was mildly dilated. Right Atrium: Right atrial size was normal in size. Pericardium: Trivial pericardial effusion is present. Mitral Valve: The mitral valve is normal in structure. Trivial mitral valve regurgitation. No evidence of mitral valve stenosis. MV peak gradient, 8.6 mmHg. The mean mitral valve gradient is 4.0 mmHg. Tricuspid Valve: The tricuspid valve is normal in structure. Tricuspid valve regurgitation is mild. Aortic Valve: The aortic valve was not well visualized. Aortic valve regurgitation is trivial. Aortic regurgitation PHT measures 637 msec. No aortic stenosis is present. Pulmonic Valve: The pulmonic valve was normal in structure. Pulmonic valve regurgitation is not visualized. Aorta: The aortic root is normal in size and structure. Venous: The inferior vena cava is dilated in size with less than 50% respiratory variability, suggesting right atrial pressure of 15 mmHg. IAS/Shunts: No atrial level shunt detected by color flow Doppler.  LEFT VENTRICLE PLAX 2D LVIDd:         4.90 cm LVIDs:         3.20 cm LV PW:         1.10 cm LV IVS:        1.10 cm LVOT diam:     2.20 cm LV SV:         67 LV SV Index:   35 LVOT Area:     3.80 cm  LV Volumes (MOD) LV vol d, MOD A2C: 38.1 ml LV vol d, MOD A4C: 37.8 ml LV vol s, MOD A2C: 9.9 ml LV vol s, MOD A4C: 8.5 ml LV SV MOD A2C:     28.2 ml LV SV MOD A4C:     37.8 ml LV SV MOD BP:      29.5 ml RIGHT VENTRICLE            IVC RV S prime:     6.96 cm/s  IVC diam: 2.10 cm TAPSE (M-mode): 1.9 cm LEFT ATRIUM             Index        RIGHT ATRIUM           Index LA diam:        2.40 cm 1.27 cm/m   RA Area:     14.70 cm LA Vol (A2C):   28.0 ml 14.86 ml/m  RA Volume:   35.80 ml  18.99 ml/m LA Vol (A4C):   19.2 ml 10.19 ml/m LA Biplane Vol: 23.3 ml 12.36 ml/m  AORTIC VALVE LVOT Vmax:   100.00 cm/s LVOT Vmean:  61.600 cm/s LVOT VTI:    0.175 m AI  PHT:      637 msec  AORTA Ao Root diam: 2.90 cm MITRAL VALVE                TRICUSPID VALVE MV Area (PHT): 3.65 cm     TR Peak grad:   20.8 mmHg MV Area VTI:   2.75 cm     TR Vmax:        228.00 cm/s MV Peak grad:  8.6 mmHg MV Mean grad:  4.0 mmHg     SHUNTS MV Vmax:       1.47 m/s     Systemic VTI:  0.18 m MV Vmean:      92.2 cm/s    Systemic Diam: 2.20 cm MV Decel Time: 208 msec MV E velocity: 126.00 cm/s Dalton McleanMD Electronically signed by Archer Bear Signature Date/Time: 09/07/2023/6:44:06 PM    Final    US  EKG SITE RITE Result Date: 09/07/2023 If Site Rite image not attached, placement could not be confirmed due to current cardiac rhythm.       Scheduled Meds:  arformoterol  15 mcg Nebulization BID   Chlorhexidine  Gluconate Cloth  6 each Topical Daily   vitamin D3  1,000 Units Oral Daily   diltiazem  15 mg Intravenous Once   FLUoxetine   20 mg Oral Daily   fluticasone   2 spray Each Nare Daily   guaiFENesin  600 mg Oral BID   methylPREDNISolone  (SOLU-MEDROL ) injection  40 mg Intravenous Q12H   oxyCODONE   10 mg Oral TID   revefenacin  175 mcg Nebulization Daily   sodium chloride  flush  10-40 mL Intracatheter Q12H   sodium chloride  flush  3 mL Intravenous Q12H   Continuous Infusions:  ceFEPime (MAXIPIME) IV Stopped (09/09/23 0620)   diltiazem (CARDIZEM) infusion     heparin  1,050 Units/hr (09/09/23 0649)     LOS: 2 days    Time spent: 45 minutes spent on chart review, discussion with nursing staff, consultants, updating family and interview/physical exam; more than 50% of that time was spent in counseling and/or coordination of care.    Enrigue Harvard, DO [facility] Available via Epic secure chat 7am-7pm After these hours, please refer to coverage provider listed on [email or web address] 09/09/2023, 8:44 AM

## 2023-09-09 NOTE — Progress Notes (Signed)
 Pharmacy Brief Note -  Evening Anticoagulation Follow Up:  Pt is an 39 yoF currently anticoagulated with heparin  drip for afib. For full history, see note by Arie Kurtz, PharmD from earlier today.   Assessment:  Heparin  level = 0.43 is therapeutic on heparin  infusion of 1050 units/hr No bleeding reported. Pt does have some bruising.   Goal: Heparin  level 0.3 - 0.7  Plan: Continue heparin  infusion at 1050 units/hr Check confirmatory 8 hour heparin  level  CBC, heparin  level daily Monitor for signs of bleeding  Shireen Dory, PharmD 09/09/23 3:47 PM

## 2023-09-09 NOTE — Progress Notes (Signed)
 Sara Barnes   DOB:Sep 19, 1940   HQ#:[id number]   UXL#:[id number]  Medical oncology follow-up note  Subjective: Patient was transferred from ICU to regular floor today, she is feeling better, still on nasal cannula oxygen 7 L/min.  Her sister is at bedside.  Objective:  Vitals:   09/09/23 1415 09/09/23 1616  BP:  120/77  Pulse: 98 70  Resp: (!) 24 (!) 22  Temp:  98 F (36.7 C)  SpO2: 95% 98%    Body mass index is 28.08 kg/m.  Intake/Output Summary (Last 24 hours) at 09/09/2023 1723 Last data filed at 09/09/2023 1249 Gross per 24 hour  Intake 342.72 ml  Output 1300 ml  Net -957.28 ml     Sclerae unicteric  Oropharynx clear  No peripheral adenopathy  Neuro nonfocal    CBG (last 3)  Recent Labs    09/08/23 2355 09/09/23 0546 09/09/23 1208  GLUCAP 145* 145* 157*     Labs:  Lab Results  Component Value Date   WBC 15.5 (H) 09/09/2023   HGB 10.7 (L) 09/09/2023   HCT 33.5 (L) 09/09/2023   MCV 95.4 09/09/2023   PLT 266 09/09/2023   NEUTROABS 3.7 09/06/2023     Urine Studies No results for input(s): "UHGB", "CRYS" in the last 72 hours.  Invalid input(s): "UACOL", "UAPR", "USPG", "UPH", "UTP", "UGL", "UKET", "UBIL", "UNIT", "UROB", "ULEU", "UEPI", "UWBC", "URBC", "UBAC", "CAST", "UCOM", "BILUA"  Basic Metabolic Panel: Recent Labs  Lab 09/06/23 2239 09/07/23 0532 09/07/23 0640 09/08/23 0810 09/09/23 0543  NA 130*  --  129* 126* 128*  K 2.8*  --  3.8 4.4 3.5  CL 97*  --  98 97* 92*  CO2 23  --  21* 22 25  GLUCOSE 136*  --  166* 126* 138*  BUN 7*  --  7* 15 26*  CREATININE 0.80  --  0.83 0.74 0.90  CALCIUM  8.7*  --  8.6* 8.2* 8.5*  MG  --  1.6*  --   --  2.2   GFR Estimated Creatinine Clearance: 51.9 mL/min (by C-G formula based on SCr of 0.9 mg/dL). Liver Function Tests: Recent Labs  Lab 09/06/23 2239 09/07/23 0640 09/08/23 1613  AST 16 24  --   ALT 11 14  --   ALKPHOS 142* 122  --   BILITOT 0.6 0.6  --   PROT 6.5 6.3* 6.9  ALBUMIN  3.4* 3.1*  --     No results for input(s): "LIPASE", "AMYLASE" in the last 168 hours. No results for input(s): "AMMONIA" in the last 168 hours. Coagulation profile Recent Labs  Lab 09/06/23 2239  INR 1.0    CBC: Recent Labs  Lab 09/06/23 2239 09/07/23 0640 09/07/23 0930 09/08/23 0522 09/09/23 0543  WBC 6.0 3.3* 3.9* 10.8* 15.5*  NEUTROABS 3.7  --   --   --   --   HGB 11.0* 10.5* 10.3* 9.8* 10.7*  HCT 34.2* 32.8* 32.6* 30.4* 33.5*  MCV 96.6 95.9 97.6 96.8 95.4  PLT 278 264 269 287 266   Cardiac Enzymes: No results for input(s): "CKTOTAL", "CKMB", "CKMBINDEX", "TROPONINI" in the last 168 hours. BNP: Invalid input(s): "POCBNP" CBG: Recent Labs  Lab 09/08/23 1147 09/08/23 1721 09/08/23 2355 09/09/23 0546 09/09/23 1208  GLUCAP 148* 146* 145* 145* 157*   D-Dimer No results for input(s): "DDIMER" in the last 72 hours. Hgb A1c No results for input(s): "HGBA1C" in the last 72 hours. Lipid Profile No results for input(s): "CHOL", "HDL", "LDLCALC", "TRIG", "CHOLHDL", "LDLDIRECT" in the  last 72 hours. Thyroid  function studies No results for input(s): "TSH", "T4TOTAL", "T3FREE", "THYROIDAB" in the last 72 hours.  Invalid input(s): "FREET3" Anemia work up No results for input(s): "VITAMINB12", "FOLATE", "FERRITIN", "TIBC", "IRON", "RETICCTPCT" in the last 72 hours. Microbiology Recent Results (from the past 240 hours)  Blood Culture (routine x 2)     Status: None (Preliminary result)   Collection Time: 09/06/23 10:38 PM   Specimen: BLOOD  Result Value Ref Range Status   Specimen Description   Final    BLOOD SITE NOT SPECIFIED Performed at [facility], [address], [place], [place] [address]    Special Requests   Final    BOTTLES DRAWN AEROBIC AND ANAEROBIC Blood Culture results may not be optimal due to an inadequate volume of blood received in culture bottles Performed at [facility], [address], [place], [place] [address]     Culture   Final    NO GROWTH 2 DAYS Performed at [facility], [address] [place], [place] [address]    Report Status PENDING  Incomplete  Resp panel by RT-PCR (RSV, Flu A&B, Covid) Anterior Nasal Swab     Status: None   Collection Time: 09/06/23 11:16 PM   Specimen: Anterior Nasal Swab  Result Value Ref Range Status   SARS Coronavirus 2 by RT PCR NEGATIVE NEGATIVE Final    Comment: (NOTE) SARS-CoV-2 target nucleic acids are NOT DETECTED.  The SARS-CoV-2 RNA is generally detectable in upper respiratory specimens during the acute phase of infection. The lowest concentration of SARS-CoV-2 viral copies this assay can detect is 138 copies/mL. A negative result does not preclude SARS-Cov-2 infection and should not be used as the sole basis for treatment or other patient management decisions. A negative result may occur with  improper specimen collection/handling, submission of specimen other than nasopharyngeal swab, presence of viral mutation(s) within the areas targeted by this assay, and inadequate number of viral copies(<138 copies/mL). A negative result must be combined with clinical observations, patient history, and epidemiological information. The expected result is Negative.  Fact Sheet for Patients:  BloggerCourse.com  Fact Sheet for Healthcare Providers:  SeriousBroker.it  This test is no t yet approved or cleared by the [place]  FDA and  has been authorized for detection and/or diagnosis of SARS-CoV-2 by FDA under an Emergency Use Authorization (EUA). This EUA will remain  in effect (meaning this test can be used) for the duration of the COVID-19 declaration under Section 564(b)(1) of the Act, 21 U.S.C.section 360bbb-3(b)(1), unless the authorization is terminated  or revoked sooner.       Influenza A by PCR NEGATIVE NEGATIVE Final   Influenza B by PCR NEGATIVE NEGATIVE Final    Comment: (NOTE) The  Xpert Xpress SARS-CoV-2/FLU/RSV plus assay is intended as an aid in the diagnosis of influenza from Nasopharyngeal swab specimens and should not be used as a sole basis for treatment. Nasal washings and aspirates are unacceptable for Xpert Xpress SARS-CoV-2/FLU/RSV testing.  Fact Sheet for Patients: BloggerCourse.com  Fact Sheet for Healthcare Providers: SeriousBroker.it  This test is not yet approved or cleared by the [place]  FDA and has been authorized for detection and/or diagnosis of SARS-CoV-2 by FDA under an Emergency Use Authorization (EUA). This EUA will remain in effect (meaning this test can be used) for the duration of the COVID-19 declaration under Section 564(b)(1) of the Act, 21 U.S.C. section 360bbb-3(b)(1), unless the authorization is terminated or revoked.  Resp Syncytial Virus by PCR NEGATIVE NEGATIVE Final    Comment: (NOTE) Fact Sheet for Patients: BloggerCourse.com  Fact Sheet for Healthcare Providers: SeriousBroker.it  This test is not yet approved or cleared by the [place]  FDA and has been authorized for detection and/or diagnosis of SARS-CoV-2 by FDA under an Emergency Use Authorization (EUA). This EUA will remain in effect (meaning this test can be used) for the duration of the COVID-19 declaration under Section 564(b)(1) of the Act, 21 U.S.C. section 360bbb-3(b)(1), unless the authorization is terminated or revoked.  Performed at [facility], [address], [place], [place] [address]   Blood Culture (routine x 2)     Status: None (Preliminary result)   Collection Time: 09/06/23 11:27 PM   Specimen: BLOOD RIGHT ARM  Result Value Ref Range Status   Specimen Description   Final    BLOOD RIGHT ARM Performed at [facility], [address] [place], [place] [address]    Special Requests   Final    BOTTLES  DRAWN AEROBIC ONLY Blood Culture adequate volume Performed at [facility], [address] [place], [place] [address]    Culture   Final    NO GROWTH 2 DAYS Performed at [facility], [address] [place], [place] [address]    Report Status PENDING  Incomplete  MRSA Next Gen by PCR, Nasal     Status: None   Collection Time: 09/07/23 12:30 PM   Specimen: Nasal Mucosa; Nasal Swab  Result Value Ref Range Status   MRSA by PCR Next Gen NOT DETECTED NOT DETECTED Final    Comment: (NOTE) The GeneXpert MRSA Assay (FDA approved for NASAL specimens only), is one component of a comprehensive MRSA colonization surveillance program. It is not intended to diagnose MRSA infection nor to guide or monitor treatment for MRSA infections. Test performance is not FDA approved in patients less than 7 years old. Performed at [facility], [address], [place], [place] [address]   Respiratory (~20 pathogens) panel by PCR     Status: Abnormal   Collection Time: 09/08/23  3:49 PM   Specimen: Nasopharyngeal Swab; Respiratory  Result Value Ref Range Status   Adenovirus NOT DETECTED NOT DETECTED Final   Coronavirus 229E NOT DETECTED NOT DETECTED Final    Comment: (NOTE) The Coronavirus on the Respiratory Panel, DOES NOT test for the novel  Coronavirus (2019 nCoV)    Coronavirus HKU1 NOT DETECTED NOT DETECTED Final   Coronavirus NL63 NOT DETECTED NOT DETECTED Final   Coronavirus OC43 NOT DETECTED NOT DETECTED Final   Metapneumovirus DETECTED (A) NOT DETECTED Final   Rhinovirus / Enterovirus NOT DETECTED NOT DETECTED Final   Influenza A NOT DETECTED NOT DETECTED Final   Influenza B NOT DETECTED NOT DETECTED Final   Parainfluenza Virus 1 NOT DETECTED NOT DETECTED Final   Parainfluenza Virus 2 NOT DETECTED NOT DETECTED Final   Parainfluenza Virus 3 NOT DETECTED NOT DETECTED Final   Parainfluenza Virus 4 NOT DETECTED NOT DETECTED Final   Respiratory  Syncytial Virus NOT DETECTED NOT DETECTED Final   Bordetella pertussis NOT DETECTED NOT DETECTED Final   Bordetella Parapertussis NOT DETECTED NOT DETECTED Final   Chlamydophila pneumoniae NOT DETECTED NOT DETECTED Final   Mycoplasma pneumoniae NOT DETECTED NOT DETECTED Final    Comment: Performed at [facility], [address], [place], [place] [address]  Body fluid culture w Gram Stain     Status:  None (Preliminary result)   Collection Time: 09/08/23  4:00 PM   Specimen: Thoracentesis; Body Fluid  Result Value Ref Range Status   Specimen Description   Final    THORACENTESIS Performed at [facility], [address], [place], [place] [address]    Special Requests   Final    NONE Performed at [facility], [address], [place], [place] [address]    Gram Stain   Final    NO WBC SEEN NO ORGANISMS SEEN Performed at [facility], [address], [place], [place] [address]    Culture PENDING  Incomplete   Report Status PENDING  Incomplete      Studies:  DG Chest Port 1 View Result Date: 09/08/2023 CLINICAL DATA:  Status post thoracentesis. EXAM: PORTABLE CHEST 1 VIEW COMPARISON:  Same day. FINDINGS: Stable cardiomediastinal silhouette. Right-sided PICC line is noted with tip in expected position of right atrium. No pneumothorax is noted status post thoracentesis. IMPRESSION: No pneumothorax status post thoracentesis. Electronically Signed   By: Rosalene Colon M.D.   On: 09/08/2023 17:01   DG CHEST PORT 1 VIEW Result Date: 09/08/2023 CLINICAL DATA:  141880 SOB (shortness of breath) 141880. EXAM: PORTABLE CHEST 1 VIEW COMPARISON:  09/06/2023. FINDINGS: Low lung volume.  Mild diffuse pulmonary vascular congestion. Re-demonstration of left retrocardiac airspace opacity obscuring the left hemidiaphragm, descending thoracic aorta and blunting the left lateral costophrenic angle, suggesting combination of left lung atelectasis and/or  consolidation with pleural effusion. No significant interval change. There are new heterogeneous opacities overlying the right lower lung zone, which may represent combination of atelectasis and/or consolidation. There is subtle blunting of right lateral costophrenic angle, which may represent trace right pleural effusion. No pneumothorax on either side. Stable cardio-mediastinal silhouette. No acute osseous abnormalities. Right-sided PICC line is seen with its tip overlying the lower portion of superior vena cava. The soft tissues are within normal limits. IMPRESSION: 1. New heterogeneous opacities overlying the right lower lung zone, which may represent combination of atelectasis and/or consolidation. 2. Stable left retrocardiac opacity, as described above. Electronically Signed   By: Beula Brunswick M.D.   On: 09/08/2023 14:12    Assessment: 83 y.o. female   Hypoxic resp failure, secondary to pneumonia A-fib with rapid ventricular response Present history of hip fracture, status post surgery on April 19 Metastatic breast cancer to skin, liver and bone, on fulvestrant  injection. Crohn's disease   Plan:  - Chart reviewed - She clinically has improved, but still required high flow oxygen - Will hold fulvestrant  injection in the hospital.  Patient is not ready for hospice, I plan to see her back 1 to 2 weeks after hospital discharge.  She wants to go home and her sister will stay with her.  She understands that it would take a while for her to recover, and she may not recover fully.  I do not plan to offer her chemotherapy, plan to do [employer] to see if she is a candidate for other targeted therapy, if she is still a candidate for cancer treatment. - Will follow-up as needed.   Sonja Haigler, MD 09/09/2023  5:23 PM

## 2023-09-09 NOTE — Progress Notes (Signed)
 PHARMACY - ANTICOAGULATION CONSULT NOTE  Pharmacy Consult for Heparin  Indication: atrial fibrillation  Allergies  Allergen Reactions   Nsaids Other (See Comments)    Crohn's disease = NO NSAIDs   Codeine Nausea And Vomiting   Iodinated Contrast Media     Elevated BP,Itchy skin. Face red in color   Mesalamine  Nausea And Vomiting   Omnipaque  [Iohexol ] Itching    Itching and flushed/red face   Oxycodone  Itching and Rash   Albuterol      Atrial fibrillation RVR   Contrast Allergy Premed Pack [Prednisone  & Diphenhydramine ] Hives    Hives, rash, wheezing and shortness of breath   Lactose Intolerance (Gi) Diarrhea   Norco [Hydrocodone -Acetaminophen ] Nausea Only   Other Itching    PERFUMED LOTIONS  Cant have MRI due to ear implant    Sulfa Antibiotics Rash    Patient Measurements: Height: 5\' 6"  (167.6 cm) Weight: 78.9 kg (174 lb) IBW/kg (Calculated) : 59.3 HEPARIN  DW (KG): 75.6  Vital Signs: Temp: 96.8 F (36 C) (05/13 2355) Temp Source: Axillary (05/13 2355) BP: 111/61 (05/14 0000) Pulse Rate: 90 (05/14 0100)  Labs: Recent Labs    09/06/23 2239 09/07/23 0640 09/07/23 0930 09/07/23 1723 09/08/23 0522 09/08/23 0810 09/08/23 1549 09/08/23 1711 09/09/23 0500 09/09/23 0543  HGB 11.0* 10.5* 10.3*  --  9.8*  --   --   --   --  10.7*  HCT 34.2* 32.8* 32.6*  --  30.4*  --   --   --   --  33.5*  PLT 278 264 269  --  287  --   --   --   --  266  LABPROT 13.7  --   --   --   --   --   --   --   --   --   INR 1.0  --   --   --   --   --   --   --   --   --   HEPARINUNFRC  --   --  0.72*   < > 0.84*  --  >1.10* 0.63 0.28*  --   CREATININE 0.80 0.83  --   --   --  0.74  --   --   --   --    < > = values in this interval not displayed.    Estimated Creatinine Clearance: 58.4 mL/min (by C-G formula based on SCr of 0.74 mg/dL).  Medications:  Lovenox  40 mg sq daily - LD on 5/9  Assessment:  83 yr female admitted on 5/11 with AFib with RVR.  History of breast cancer.   Discharged 08/25/23 s/p repair of hip fracture.  Has been on Lovenox  40mg  sq daily for VTE prophylaxis CTAngio 5/12 negative for PE  Today, 09/09/2023 Heparin  level 0.28- now sub-therapeutic on heparin  950 units/hr CBC: Hgb 10.7-low/stable, Plt WNL No bleeding or complications noted by RN  Goal of Therapy:  Heparin  level 0.3-0.7 units/ml Monitor platelets by anticoagulation protocol: Yes   Plan:  Increase heparin  infusion at 1050 units/hr Daily heparil level & CBC Monitor for signs & symptoms of bleeding   Arie Kurtz, PharmD, BCPS Clinical Pharmacist 09/09/2023 6:31 AM

## 2023-09-09 NOTE — Progress Notes (Signed)
 [facility] Liaison Note  09/09/2023  Sara Barnes 1940-11-05 [id number]  Location: RN Hospital Liaison screened the patient remotely at [facility].  Insurance: [facility] HMO   Sara Barnes is a 83 y.o. female who is a Primary Care Patient of Crecencio Dodge, Candida Chalk, DO [place]  [facility].The patient was screened for 7 day readmission hospitalization with noted extreme risk score for unplanned readmission risk with 2 IP in 6 months.  The patient was assessed for potential Care Management service needs for post hospital transition for care coordination. Review of patient's electronic medical record reveals patient was admitted with Respiratory distress. Oncology is following heavily. PT/OT cancelled today due to cardiac issues and remains pending for discharge disposition for recommended therapy. If pt is discharged to a SNF this facility will continue to address pt's needs.   Plan: [facility] Liaison will continue to follow progress and disposition to asess for post hospital community care coordination/management needs.  Referral request for community care coordination: pending disposition.   VBCI Care Management/Population Health does not replace or interfere with any arrangements made by the Inpatient Transition of Care team.   For questions contact:   Lilla Reichert, RN, BSN Hospital Liaison [name]   [facility], Population Health Office Hours MTWF  8:00 am-6:00 pm Direct Dial: [phone] mobile [email or web address]

## 2023-09-09 NOTE — Progress Notes (Signed)
 OT Cancellation Note  Patient Details Name: Sara Barnes MRN: [id number] DOB: 12-03-1940   Cancelled Treatment:    Reason Eval/Treat Not Completed: Medical issues which prohibited therapy OT attempted initial eval with medical staff reporting episode of Afib/RVR this am. Will continue to follow for OT once patient medically stable.   Agostino Gorin OT/L Acute Rehabilitation Department  [phone]    09/09/2023, 8:38 AM

## 2023-09-09 NOTE — Progress Notes (Signed)
 NAME:  Sara Barnes, MRN:  [id number], DOB:  [birth year], LOS: 2 ADMISSION DATE:  09/06/2023, CONSULTATION DATE:  5/13 REFERRING MD:  Jenell Mirza, CHIEF COMPLAINT:  wheezing, respiratory failure   History of Present Illness:  Sara Barnes is a 83 y.o. woman with a history of metastatic breast cancer who was admitted to the hospital with acute respiratory failure on 5/12 after developing shortness of breath, productive cough, wheezing on 5/11.  She had a fall with hip fracture in April and has been at rehab since then, making good progress.  At baseline for the last 2 years she has had activity limitation, but it is unclear if this is more due to fatigue or dyspnea on exertion.  This had been at baseline until her symptoms began acutely 2 days ago.  En route to the hospital on BiPAP she developed A-fib with RVR after receiving a breathing treatment.  She is out of A-fib today and remains off BiPAP.  She had an episode of worse shortness of breath this morning.  Family requesting pulmonology consult for wheezing.  No PTA inhalers.  No history of tobacco abuse.  Has a history of thoracentesis in January 2023> atypical cells but not confirmed malignant.  She reports that her symptoms now feel like when she had her thoracentesis in 2023.  No chronic antiplatelet or anticoagulant medications.  History is obtained with help of her sister due to patient being very hard of hearing.  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 Hip fracture 07/2023 Crohn's  Significant Hospital Events: Including procedures, antibiotic start and stop dates in addition to other pertinent events   5/12 admitted, started on azithromycin  and ceftriaxone 5/13 left thoracentesis with 800 cc clear yellow fluid removed 5/14 issues with pain and confusion overnight  Interim History / Subjective:  Seen sitting up in bed with reports of significant frustration regarding nighttime care.  She reports that  she felt extremely "poor" overnight with little help from staff however she expresses inability to effectively communicate with staff due to extreme hard of hearing.  She and sister adamantly request no further IV morphine   Objective    Blood pressure (!) 162/114, pulse (!) 128, temperature (!) 96.8 F (36 C), temperature source Axillary, resp. rate (!) 23, height 5\' 6"  (1.676 m), weight 78.9 kg, SpO2 (!) 78%.        Intake/Output Summary (Last 24 hours) at 09/09/2023 0804 Last data filed at 09/09/2023 0102 Gross per 24 hour  Intake 649.12 ml  Output 2000 ml  Net -1350.88 ml   Filed Weights   09/06/23 2227  Weight: 78.9 kg    Examination: General: Acute on chronic ill-appearing deconditioned frail elderly female lying in bed in no acute distress HEENT: [place]/AT, MM pink/moist, PERRL,  Neuro: Alert and oriented x 3, extremely hard of hearing CV: s1s2 regular rate and rhythm, no murmur, rubs, or gallops,  PULM: Shallow respirations bilaterally, nonproductive cough, on supplemental oxygen, no increased work of breathing GI: soft, bowel sounds active in all 4 quadrants, non-tender, non-distended Extremities: warm/dry, no edema  Skin: no rashes or lesions  Resolved problem list   Assessment and Plan  Acute respiratory failure with hypoxia due to left lower lobe pneumonia with viral metapneumovirus Bilateral dependent pleural effusions -Left effusion likely parapneumonic since that is larger and may be loculated based on CT appearance -Underwent thoracentesis 5/13 with 800 cc fluid removed -RVP panel for metapneumovirus P: Remains on Cefepime 5/12 reasonable  to consider de-escalation Follow cultures Continue to diurese as needed Continue Brovana and Yupelri Encourage frequent and adequate pulmonary hygiene Mobilize as able Droplet precautions  Called and confirmed with cytology that pleural fluid was received, technician will also ensure microbiology receives other samples to  collect body fluid cell count and culture  Stage 4 metastatic breast cancer -palliative care consulted; agree with Oncology's recommendation to consider hospice P: Continue supportive care  Hyponatremia P: Avoid hypotonic fluids Trend Bment  Goals of care Palliative care met with patient and family 5/13 with documented wishes to continue current interventions including full scope full code and family has elected for no further palliative involvement.   Remains stable for transfer out of the ICU  Best Practice (right click and "Reselect all SmartList Selections" daily)  Per primary  Critical care time: NA  Sherisa Gilvin D. Harris, NP-C [facility] Personal contact information can be found on Amion  If no contact or response made please call [phone] 09/09/2023, 8:09 AM

## 2023-09-10 DIAGNOSIS — R0603 Acute respiratory distress: Secondary | ICD-10-CM | POA: Diagnosis not present

## 2023-09-10 DIAGNOSIS — I4891 Unspecified atrial fibrillation: Secondary | ICD-10-CM

## 2023-09-10 DIAGNOSIS — J123 Human metapneumovirus pneumonia: Secondary | ICD-10-CM | POA: Diagnosis not present

## 2023-09-10 DIAGNOSIS — Z8781 Personal history of (healed) traumatic fracture: Secondary | ICD-10-CM

## 2023-09-10 DIAGNOSIS — C50912 Malignant neoplasm of unspecified site of left female breast: Secondary | ICD-10-CM | POA: Diagnosis not present

## 2023-09-10 DIAGNOSIS — J9601 Acute respiratory failure with hypoxia: Secondary | ICD-10-CM | POA: Diagnosis not present

## 2023-09-10 DIAGNOSIS — J189 Pneumonia, unspecified organism: Secondary | ICD-10-CM | POA: Diagnosis not present

## 2023-09-10 LAB — CBC
HCT: 30.9 % — ABNORMAL LOW (ref 36.0–46.0)
Hemoglobin: 10 g/dL — ABNORMAL LOW (ref 12.0–15.0)
MCH: 30.8 pg (ref 26.0–34.0)
MCHC: 32.4 g/dL (ref 30.0–36.0)
MCV: 95.1 fL (ref 80.0–100.0)
Platelets: 225 10*3/uL (ref 150–400)
RBC: 3.25 MIL/uL — ABNORMAL LOW (ref 3.87–5.11)
RDW: 12.1 % (ref 11.5–15.5)
WBC: 13.6 10*3/uL — ABNORMAL HIGH (ref 4.0–10.5)
nRBC: 0 % (ref 0.0–0.2)

## 2023-09-10 LAB — BASIC METABOLIC PANEL WITH GFR
Anion gap: 8 (ref 5–15)
BUN: 22 mg/dL (ref 8–23)
CO2: 25 mmol/L (ref 22–32)
Calcium: 8.1 mg/dL — ABNORMAL LOW (ref 8.9–10.3)
Chloride: 92 mmol/L — ABNORMAL LOW (ref 98–111)
Creatinine, Ser: 0.62 mg/dL (ref 0.44–1.00)
GFR, Estimated: >60 mL/min (ref >60)
Glucose, Bld: 114 mg/dL — ABNORMAL HIGH (ref 70–99)
Potassium: 3.6 mmol/L (ref 3.5–5.1)
Sodium: 125 mmol/L — ABNORMAL LOW (ref 135–145)

## 2023-09-10 LAB — GLUCOSE, CAPILLARY
Glucose-Capillary: 112 mg/dL — ABNORMAL HIGH (ref 70–99)
Glucose-Capillary: 113 mg/dL — ABNORMAL HIGH (ref 70–99)
Glucose-Capillary: 114 mg/dL — ABNORMAL HIGH (ref 70–99)
Glucose-Capillary: 187 mg/dL — ABNORMAL HIGH (ref 70–99)
Glucose-Capillary: 92 mg/dL (ref 70–99)

## 2023-09-10 LAB — HEPARIN LEVEL (UNFRACTIONATED)
Heparin Unfractionated: 0.21 [IU]/mL — ABNORMAL LOW (ref 0.30–0.70)
Heparin Unfractionated: 0.28 [IU]/mL — ABNORMAL LOW (ref 0.30–0.70)
Heparin Unfractionated: 0.42 [IU]/mL (ref 0.30–0.70)

## 2023-09-10 LAB — SODIUM, URINE, RANDOM: Sodium, Ur: 34 mmol/L

## 2023-09-10 LAB — STREP PNEUMONIAE URINARY ANTIGEN: Strep Pneumo Urinary Antigen: NEGATIVE

## 2023-09-10 LAB — OSMOLALITY, URINE: Osmolality, Ur: 404 mosm/kg (ref 300–900)

## 2023-09-10 LAB — OSMOLALITY: Osmolality: 266 mosm/kg — ABNORMAL LOW (ref 275–295)

## 2023-09-10 MED ORDER — ONDANSETRON HCL 4 MG/2ML IJ SOLN
4.0000 mg | Freq: Four times a day (QID) | INTRAMUSCULAR | Status: DC | PRN
  Administered 2023-09-12 – 2023-09-21 (×8): 4 mg via INTRAVENOUS
  Filled 2023-09-10 (×8): qty 2

## 2023-09-10 MED ORDER — DILTIAZEM HCL 60 MG PO TABS
60.0000 mg | ORAL_TABLET | Freq: Four times a day (QID) | ORAL | Status: DC
  Administered 2023-09-10 – 2023-09-11 (×3): 60 mg via ORAL
  Filled 2023-09-10 (×3): qty 1

## 2023-09-10 MED ORDER — METHYLPREDNISOLONE SODIUM SUCC 40 MG IJ SOLR
40.0000 mg | Freq: Every day | INTRAMUSCULAR | Status: DC
  Administered 2023-09-10 – 2023-09-12 (×3): 40 mg via INTRAVENOUS
  Filled 2023-09-10 (×3): qty 1

## 2023-09-10 NOTE — Progress Notes (Signed)
 PROGRESS NOTE    Sara Barnes  HWE:[id number] DOB: [birth year] DOA: 09/06/2023 PCP: Estill Hemming, DO    Brief Narrative:  [phone] with h/o Crohn's s/p ileostomy, metastatic breast CA, afib not on AC due to hepatic hematoma, anxiety/depression, and recent hip repair currently in rehab who presented on 5/11 with SOB. While in the ER, she experienced a contrast reaction, as well. She is on BIPAP and was given steroids, benadryl , H2 blocker. She was diagnosed with sepsis due to PNA and afib with RVR.     Assessment and Plan: Sepsis secondary to pneumonia with respiratory distress Patient with recent hospitalization for hip fracture, presenting from SNF rehab with 2 days of URI and worsening respiratory failure  Lowest documented O2 sats are 92% but she was significantly SOB and placed on BIPAP -> [place] O2 Respiratory panel shows metapneumovirus SIRS criteria with tachycardia, tachypnea; lactate of 4.1 indicating acute organ dysfunction CXR with marked severity L basilar atelectasis vs. Infiltrate CT with R/ L basilar atelectasis and small patchy R-sided infiltrates, likely multifocal PNA Also with midsternal lymphadenopathy concern for metastasis IV vancomycin  and cefepime given recent hospitalization BiPAP -> [place] O2-wean as able Continue nebulizers -PCCM following-status post thoracentesis with 800 cc removed-cultures negative   Contrast-induced allergic reaction -Treated-- there was ? Of being allergic to the prep as well but seems to tolerate solumedrol    A-fib with RVR with h/o paroxysmal atrial fibrillation Developed RVR after administration of SoluMedrol/Abluterol with EMS Per chart review, patient has history of paroxysmal afib (last in 2018 during hospitalization); family denies knowledge of this Not on anticoagulation due to history of hepatic hematoma in 2022  (resolved) Also not on rate controlling medications Started on IV heparin  drip and IV Cardizem drip-restarted on  5/14 AM-- wean to off Cardiology consulted Echocardiogram unremarkable - Per cardiology he would like to get imaging study of liver prior to starting Eliquis as patient has history of hematoma after a biopsy.  I spoke with radiology--they recommend a CTA or MRI but patient unable to have MRI due to implant and allergy to contrast from the CT scan.  Patient received the prep prior to the CT scan this visit and still had a reaction - Will plan for non contrasted CT scan in the a.m. as patient too tired to complete today   Acute metabolic encephalopathy Appears to have resolved   Anxiety Patient takes Xanax  and Ambien  at home   History of hip fracture Resume oxycodone  Presented from [facility], there for rehab Reports that she has done great with rehab--is considering a different rehab or home with sister   History of metastatic breast cancer to skin, liver and bone Metastatic breast cancer to skin, liver and bone CT C/A/P with new and old lytic lesions and concern for hilar/mediastinal LN mets Consulted inpatient palliative care to discuss further goal of care with patient and family Dr. Maryalice Smaller consulted If her functional status does not recover, she will need to stop chemotherapy Hospice-palliative care was offered but family refused F/u with Dr. Maryalice Smaller in 2-3 weeks    Chronic hyponatremia Sodium level trending down - Urine studies and osmole's ordered but pending   Mood d/o Continue alprazolam , fluoxetine    Crohn's disease On daily prednisone  but this is on hold while on Solumedrol       DVT prophylaxis: SCDs Start: 09/07/23 0451 Place TED hose Start: 09/07/23 0451    Code Status: Full Code Family Communication: At bedside  Disposition Plan:  Level of care: Progressive Status is: Inpatient     Consultants:  Oncology Cardiology Palliative care-family refused PCCM    Subjective: Patient feeling very tired today, had episode of wheezing and shortness of breath  and heavy placed back on BiPAP.  Feeling better on the BiPAP  Objective: Vitals:   09/10/23 1110 09/10/23 1123 09/10/23 1147 09/10/23 1202  BP: 132/83   113/76  Pulse: 81  90 96  Resp:    (!) 21  Temp:    98.4 F (36.9 C)  TempSrc:    Oral  SpO2:  90% 99% 99%  Weight:      Height:        Intake/Output Summary (Last 24 hours) at 09/10/2023 1428 Last data filed at 09/10/2023 0930 Gross per 24 hour  Intake 120 ml  Output 2000 ml  Net -1880 ml   Filed Weights   09/06/23 2227  Weight: 78.9 kg    Examination:   General: Appearance:     Overweight female in no acute distress-who appears fatigued     Lungs:   On BiPAP, diminished, respirations unlabored  Heart:    Normal heart rate.  An irregular   MS:   All extremities are intact.    Neurologic:   Awake, alert, oriented x 3. No apparent focal neurological           defect. -Very hard of hearing       Data Reviewed: I have personally reviewed following labs and imaging studies  CBC: Recent Labs  Lab 09/06/23 2239 09/07/23 0640 09/07/23 0930 09/08/23 0522 09/09/23 0543 09/10/23 0454  WBC 6.0 3.3* 3.9* 10.8* 15.5* 13.6*  NEUTROABS 3.7  --   --   --   --   --   HGB 11.0* 10.5* 10.3* 9.8* 10.7* 10.0*  HCT 34.2* 32.8* 32.6* 30.4* 33.5* 30.9*  MCV 96.6 95.9 97.6 96.8 95.4 95.1  PLT 278 264 269 287 266 225   Basic Metabolic Panel: Recent Labs  Lab 09/06/23 2239 09/07/23 0532 09/07/23 0640 09/08/23 0810 09/09/23 0543 09/10/23 0454  NA 130*  --  129* 126* 128* 125*  K 2.8*  --  3.8 4.4 3.5 3.6  CL 97*  --  98 97* 92* 92*  CO2 23  --  21* 22 25 25   GLUCOSE 136*  --  166* 126* 138* 114*  BUN 7*  --  7* 15 26* 22  CREATININE 0.80  --  0.83 0.74 0.90 0.62  CALCIUM  8.7*  --  8.6* 8.2* 8.5* 8.1*  MG  --  1.6*  --   --  2.2  --    GFR: Estimated Creatinine Clearance: 58.4 mL/min (by C-G formula based on SCr of 0.62 mg/dL). Liver Function Tests: Recent Labs  Lab 09/06/23 2239 09/07/23 0640 09/08/23 1613   AST 16 24  --   ALT 11 14  --   ALKPHOS 142* 122  --   BILITOT 0.6 0.6  --   PROT 6.5 6.3* 6.9  ALBUMIN  3.4* 3.1*  --    No results for input(s): "LIPASE", "AMYLASE" in the last 168 hours. No results for input(s): "AMMONIA" in the last 168 hours. Coagulation Profile: Recent Labs  Lab 09/06/23 2239  INR 1.0   Cardiac Enzymes: No results for input(s): "CKTOTAL", "CKMB", "CKMBINDEX", "TROPONINI" in the last 168 hours. BNP (last 3 results) No results for input(s): "PROBNP" in the last 8760 hours. HbA1C: No results for input(s): "HGBA1C" in the last 72 hours. CBG: Recent  Labs  Lab 09/09/23 1208 09/09/23 1746 09/10/23 0018 09/10/23 0609 09/10/23 1159  GLUCAP 157* 138* 112* 92 114*   Lipid Profile: No results for input(s): "CHOL", "HDL", "LDLCALC", "TRIG", "CHOLHDL", "LDLDIRECT" in the last 72 hours. Thyroid  Function Tests: No results for input(s): "TSH", "T4TOTAL", "FREET4", "T3FREE", "THYROIDAB" in the last 72 hours. Anemia Panel: No results for input(s): "VITAMINB12", "FOLATE", "FERRITIN", "TIBC", "IRON", "RETICCTPCT" in the last 72 hours. Sepsis Labs: Recent Labs  Lab 09/06/23 2239 09/07/23 0407 09/07/23 0930 09/07/23 1723  LATICACIDVEN 2.1* 4.1* 2.8* 0.8    Recent Results (from the past 240 hours)  Blood Culture (routine x 2)     Status: None (Preliminary result)   Collection Time: 09/06/23 10:38 PM   Specimen: BLOOD  Result Value Ref Range Status   Specimen Description   Final    BLOOD SITE NOT SPECIFIED Performed at [facility], [address] [place], [place] [address]    Special Requests   Final    BOTTLES DRAWN AEROBIC AND ANAEROBIC Blood Culture results may not be optimal due to an inadequate volume of blood received in culture bottles Performed at [facility], [address], [place], [place] [address]    Culture   Final    NO GROWTH 3 DAYS Performed at [facility], [address] [place],  [place] [address]    Report Status PENDING  Incomplete  Resp panel by RT-PCR (RSV, Flu A&B, Covid) Anterior Nasal Swab     Status: None   Collection Time: 09/06/23 11:16 PM   Specimen: Anterior Nasal Swab  Result Value Ref Range Status   SARS Coronavirus 2 by RT PCR NEGATIVE NEGATIVE Final    Comment: (NOTE) SARS-CoV-2 target nucleic acids are NOT DETECTED.  The SARS-CoV-2 RNA is generally detectable in upper respiratory specimens during the acute phase of infection. The lowest concentration of SARS-CoV-2 viral copies this assay can detect is 138 copies/mL. A negative result does not preclude SARS-Cov-2 infection and should not be used as the sole basis for treatment or other patient management decisions. A negative result may occur with  improper specimen collection/handling, submission of specimen other than nasopharyngeal swab, presence of viral mutation(s) within the areas targeted by this assay, and inadequate number of viral copies(<138 copies/mL). A negative result must be combined with clinical observations, patient history, and epidemiological information. The expected result is Negative.  Fact Sheet for Patients:  BloggerCourse.com  Fact Sheet for Healthcare Providers:  SeriousBroker.it  This test is no t yet approved or cleared by the [place]  FDA and  has been authorized for detection and/or diagnosis of SARS-CoV-2 by FDA under an Emergency Use Authorization (EUA). This EUA will remain  in effect (meaning this test can be used) for the duration of the COVID-19 declaration under Section 564(b)(1) of the Act, 21 U.S.C.section 360bbb-3(b)(1), unless the authorization is terminated  or revoked sooner.       Influenza A by PCR NEGATIVE NEGATIVE Final   Influenza B by PCR NEGATIVE NEGATIVE Final    Comment: (NOTE) The Xpert Xpress SARS-CoV-2/FLU/RSV plus assay is intended as an aid in the diagnosis of influenza from  Nasopharyngeal swab specimens and should not be used as a sole basis for treatment. Nasal washings and aspirates are unacceptable for Xpert Xpress SARS-CoV-2/FLU/RSV testing.  Fact Sheet for Patients: BloggerCourse.com  Fact Sheet for Healthcare Providers: SeriousBroker.it  This test is not yet approved or cleared by the [place]  FDA and has been  authorized for detection and/or diagnosis of SARS-CoV-2 by FDA under an Emergency Use Authorization (EUA). This EUA will remain in effect (meaning this test can be used) for the duration of the COVID-19 declaration under Section 564(b)(1) of the Act, 21 U.S.C. section 360bbb-3(b)(1), unless the authorization is terminated or revoked.     Resp Syncytial Virus by PCR NEGATIVE NEGATIVE Final    Comment: (NOTE) Fact Sheet for Patients: BloggerCourse.com  Fact Sheet for Healthcare Providers: SeriousBroker.it  This test is not yet approved or cleared by the [place]  FDA and has been authorized for detection and/or diagnosis of SARS-CoV-2 by FDA under an Emergency Use Authorization (EUA). This EUA will remain in effect (meaning this test can be used) for the duration of the COVID-19 declaration under Section 564(b)(1) of the Act, 21 U.S.C. section 360bbb-3(b)(1), unless the authorization is terminated or revoked.  Performed at [facility], [address], [place], [place] [address]   Blood Culture (routine x 2)     Status: None (Preliminary result)   Collection Time: 09/06/23 11:27 PM   Specimen: BLOOD RIGHT ARM  Result Value Ref Range Status   Specimen Description   Final    BLOOD RIGHT ARM Performed at [facility], [address], [place], [place] [address]    Special Requests   Final    BOTTLES DRAWN AEROBIC ONLY Blood Culture adequate volume Performed at [facility], [address] [place], [place] [address]    Culture   Final    NO GROWTH 3 DAYS Performed at [facility], [address] [place], [place] [address]    Report Status PENDING  Incomplete  MRSA Next Gen by PCR, Nasal     Status: None   Collection Time: 09/07/23 12:30 PM   Specimen: Nasal Mucosa; Nasal Swab  Result Value Ref Range Status   MRSA by PCR Next Gen NOT DETECTED NOT DETECTED Final    Comment: (NOTE) The GeneXpert MRSA Assay (FDA approved for NASAL specimens only), is one component of a comprehensive MRSA colonization surveillance program. It is not intended to diagnose MRSA infection nor to guide or monitor treatment for MRSA infections. Test performance is not FDA approved in patients less than 57 years old. Performed at [facility], [address] [place], [place] [address]   Respiratory (~20 pathogens) panel by PCR     Status: Abnormal   Collection Time: 09/08/23  3:49 PM   Specimen: Nasopharyngeal Swab; Respiratory  Result Value Ref Range Status   Adenovirus NOT DETECTED NOT DETECTED Final   Coronavirus 229E NOT DETECTED NOT DETECTED Final    Comment: (NOTE) The Coronavirus on the Respiratory Panel, DOES NOT test for the novel  Coronavirus (2019 nCoV)    Coronavirus HKU1 NOT DETECTED NOT DETECTED Final   Coronavirus NL63 NOT DETECTED NOT DETECTED Final   Coronavirus OC43 NOT DETECTED NOT DETECTED Final   Metapneumovirus DETECTED (A) NOT DETECTED Final   Rhinovirus / Enterovirus NOT DETECTED NOT DETECTED Final   Influenza A NOT DETECTED NOT DETECTED Final   Influenza B NOT DETECTED NOT DETECTED Final   Parainfluenza Virus 1 NOT DETECTED NOT DETECTED Final   Parainfluenza Virus 2 NOT DETECTED NOT DETECTED Final   Parainfluenza Virus 3 NOT DETECTED NOT DETECTED Final   Parainfluenza Virus 4 NOT DETECTED NOT DETECTED Final   Respiratory Syncytial Virus NOT DETECTED NOT DETECTED Final   Bordetella pertussis NOT DETECTED NOT DETECTED Final  Bordetella Parapertussis NOT DETECTED NOT DETECTED Final   Chlamydophila pneumoniae NOT DETECTED NOT DETECTED Final   Mycoplasma pneumoniae NOT DETECTED NOT DETECTED Final    Comment: Performed at [facility], [address], [place], [place] [address]  Body fluid culture w Gram Stain     Status: None (Preliminary result)   Collection Time: 09/08/23  4:00 PM   Specimen: Thoracentesis; Body Fluid  Result Value Ref Range Status   Specimen Description   Final    THORACENTESIS Performed at [facility], [address] [place], [place] [address]    Special Requests   Final    NONE Performed at [facility], [address], [place], [place] [address]    Gram Stain NO WBC SEEN NO ORGANISMS SEEN   Final   Culture   Final    NO GROWTH 1 DAY Performed at [facility], [address] [place], [place] [address]    Report Status PENDING  Incomplete         Radiology Studies: DG Chest Port 1 View Result Date: 09/08/2023 CLINICAL DATA:  Status post thoracentesis. EXAM: PORTABLE CHEST 1 VIEW COMPARISON:  Same day. FINDINGS: Stable cardiomediastinal silhouette. Right-sided PICC line is noted with tip in expected position of right atrium. No pneumothorax is noted status post thoracentesis. IMPRESSION: No pneumothorax status post thoracentesis. Electronically Signed   By: Rosalene Colon M.D.   On: 09/08/2023 17:01        Scheduled Meds:  arformoterol  15 mcg Nebulization BID   Chlorhexidine  Gluconate Cloth  6 each Topical Daily   vitamin D3  1,000 Units Oral Daily   diltiazem  60 mg Oral Q6H   FLUoxetine   20 mg Oral Daily   fluticasone   2 spray Each Nare Daily   guaiFENesin  600 mg Oral BID   revefenacin  175 mcg Nebulization Daily   sodium chloride  flush  10-40 mL Intracatheter Q12H   sodium chloride  flush  3 mL Intravenous Q12H   Continuous Infusions:  diltiazem (CARDIZEM) infusion 7.5 mg/hr (09/10/23 0922)   heparin  1,300  Units/hr (09/10/23 1413)     LOS: 3 days    Time spent: 45 minutes spent on chart review, discussion with nursing staff, consultants, updating family and interview/physical exam; more than 50% of that time was spent in counseling and/or coordination of care.    Enrigue Harvard, DO [facility] Available via Epic secure chat 7am-7pm After these hours, please refer to coverage provider listed on [email or web address] 09/10/2023, 2:28 PM

## 2023-09-10 NOTE — Progress Notes (Signed)
 PHARMACY - ANTICOAGULATION CONSULT NOTE  Pharmacy Consult for Heparin  Indication: atrial fibrillation  Allergies  Allergen Reactions   Nsaids Other (See Comments)    Crohn's disease = NO NSAIDs   Codeine Nausea And Vomiting   Iodinated Contrast Media     Elevated BP,Itchy skin. Face red in color   Mesalamine  Nausea And Vomiting   Omnipaque  [Iohexol ] Itching    Itching and flushed/red face   Oxycodone  Itching and Rash   Albuterol      Atrial fibrillation RVR   Contrast Allergy Premed Pack [Prednisone  & Diphenhydramine ] Hives    Hives, rash, wheezing and shortness of breath   Lactose Intolerance (Gi) Diarrhea   Norco [Hydrocodone -Acetaminophen ] Nausea Only   Other Itching    PERFUMED LOTIONS  Cant have MRI due to ear implant    Sulfa Antibiotics Rash    Patient Measurements: Height: 5\' 6"  (167.6 cm) Weight: 78.9 kg (174 lb) IBW/kg (Calculated) : 59.3 HEPARIN  DW (KG): 75.6  Vital Signs: Temp: 98.4 F (36.9 C) (05/15 1202) Temp Source: Oral (05/15 1202) BP: 113/76 (05/15 1202) Pulse Rate: 96 (05/15 1202)  Labs: Recent Labs    09/08/23 0522 09/08/23 0810 09/08/23 1549 09/09/23 0543 09/09/23 1521 09/10/23 0006 09/10/23 0454 09/10/23 1251  HGB 9.8*  --   --  10.7*  --   --  10.0*  --   HCT 30.4*  --   --  33.5*  --   --  30.9*  --   PLT 287  --   --  266  --   --  225  --   HEPARINUNFRC 0.84*  --    < >  --    < > 0.21* 0.42 0.28*  CREATININE  --  0.74  --  0.90  --   --  0.62  --    < > = values in this interval not displayed.    Estimated Creatinine Clearance: 58.4 mL/min (by C-G formula based on SCr of 0.62 mg/dL).  Medications:  Lovenox  40 mg sq daily - LD on 5/9  Assessment:  83 yr female admitted on 5/11 with AFib with RVR.  History of breast cancer.  Discharged 08/25/23 s/p repair of hip fracture.  Has been on Lovenox  40mg  sq daily for VTE prophylaxis CTAngio 5/12 negative for PE  Today, 09/10/2023 00:06 Heparin  level 0.21- sub-therapeutic on  heparin  1050 units/hr 04:54 Heparin  level obtained less than 6 hours after rate increase so not valid 12:51 Heparin  level 0.28 - sub-therapeutic on heparin  1200 units/hr No bleeding or complications noted by RN  Goal of Therapy:  Heparin  level 0.3-0.7 units/ml Monitor platelets by anticoagulation protocol: Yes   Plan:  Increase heparin  infusion to 1300 units/hr Heparin  level in 8 hours Daily heparin  level & CBC Monitor for signs & symptoms of bleeding   Thank you for allowing pharmacy to be a part of this patient's care.  Alfredo Inch, PharmD, BCPS Clinical Pharmacist [place] 09/10/2023 2:01 PM

## 2023-09-10 NOTE — Progress Notes (Signed)
 MD notified that respiratory therapist (RT) came by to give patient a breathing treatment and RT said that patient was wheezing bad, had retractions, very short of breath, oxygen demands went up, and patient's lungs sounded wet. Since there was a PRN order for BiPAP, RT decided it was best to place patient on BiPAP.

## 2023-09-10 NOTE — Progress Notes (Signed)
 PT Cancellation Note  Patient Details Name: Sara Barnes MRN: [id number] DOB: [birth year]   Cancelled Treatment:    Reason Eval/Treat Not Completed: Medical issues which prohibited therapy Per RN per Cardiology. ' [name] PT Acute Rehabilitation Services Office [phone]  [name] 09/10/2023, 9:30 AM

## 2023-09-10 NOTE — Progress Notes (Signed)
 PHARMACY - ANTICOAGULATION CONSULT NOTE  Pharmacy Consult for Heparin  Indication: atrial fibrillation  Allergies  Allergen Reactions   Nsaids Other (See Comments)    Crohn's disease = NO NSAIDs   Codeine Nausea And Vomiting   Iodinated Contrast Media     Elevated BP,Itchy skin. Face red in color   Mesalamine  Nausea And Vomiting   Omnipaque  [Iohexol ] Itching    Itching and flushed/red face   Oxycodone  Itching and Rash   Albuterol      Atrial fibrillation RVR   Contrast Allergy Premed Pack [Prednisone  & Diphenhydramine ] Hives    Hives, rash, wheezing and shortness of breath   Lactose Intolerance (Gi) Diarrhea   Norco [Hydrocodone -Acetaminophen ] Nausea Only   Other Itching    PERFUMED LOTIONS  Cant have MRI due to ear implant    Sulfa Antibiotics Rash    Patient Measurements: Height: 5\' 6"  (167.6 cm) Weight: 78.9 kg (174 lb) IBW/kg (Calculated) : 59.3 HEPARIN  DW (KG): 75.6  Vital Signs: Temp: 98.7 F (37.1 C) (05/15 0017) Temp Source: Oral (05/15 0017) BP: 121/84 (05/15 0017) Pulse Rate: 82 (05/15 0017)  Labs: Recent Labs    09/07/23 0640 09/07/23 0930 09/07/23 1723 09/08/23 0522 09/08/23 0810 09/08/23 1549 09/09/23 0500 09/09/23 0543 09/09/23 1521 09/10/23 0006  HGB 10.5* 10.3*  --  9.8*  --   --   --  10.7*  --   --   HCT 32.8* 32.6*  --  30.4*  --   --   --  33.5*  --   --   PLT 264 269  --  287  --   --   --  266  --   --   HEPARINUNFRC  --  0.72*   < > 0.84*  --    < > 0.28*  --  0.43 0.21*  CREATININE 0.83  --   --   --  0.74  --   --  0.90  --   --    < > = values in this interval not displayed.    Estimated Creatinine Clearance: 51.9 mL/min (by C-G formula based on SCr of 0.9 mg/dL).  Medications:  Lovenox  40 mg sq daily - LD on 5/9  Assessment:  83 yr female admitted on 5/11 with AFib with RVR.  History of breast cancer.  Discharged 08/25/23 s/p repair of hip fracture.  Has been on Lovenox  40mg  sq daily for VTE prophylaxis CTAngio 5/12  negative for PE  Today, 09/10/2023 Heparin  level 0.21- now sub-therapeutic on heparin  1050 units/hr No bleeding or complications noted by RN  Goal of Therapy:  Heparin  level 0.3-0.7 units/ml Monitor platelets by anticoagulation protocol: Yes   Plan:  Increase heparin  infusion at 1200 units/hr Heparin  level in 8 hours Daily heparil level & CBC Monitor for signs & symptoms of bleeding   Be[place] RPh 09/10/2023, 12:54 AM

## 2023-09-10 NOTE — Plan of Care (Signed)
 Palliative-   Chart reviewed.  Patient and family have declined further discussions or interventions from Palliative.  Will sign off.  Please feel free to call or re-consult if family decides they would like Palliative support.   Micki Alas, AGNP-C Palliative Medicine  No charge

## 2023-09-10 NOTE — Progress Notes (Signed)
 Rounding Note    Patient Name: Sara Barnes Date of Encounter: 09/10/2023  [facility] Cardiologist: Sara Bertrand, DO (new)  Chief Complaint:  Chief Complaint  Patient presents with   Respiratory Distress  Reason of consult: Atrial fibrillation with rapid ventricular rate  Subjective   Accompanied by sister at bedside. Endorses feeling tired and fatigued. Remains in atrial fibrillation  Inpatient Medications    Scheduled Meds:  arformoterol  15 mcg Nebulization BID   Chlorhexidine  Gluconate Cloth  6 each Topical Daily   vitamin D3  1,000 Units Oral Daily   diltiazem  60 mg Oral Q8H   FLUoxetine   20 mg Oral Daily   fluticasone   2 spray Each Nare Daily   guaiFENesin  600 mg Oral BID   revefenacin  175 mcg Nebulization Daily   sodium chloride  flush  10-40 mL Intracatheter Q12H   sodium chloride  flush  3 mL Intravenous Q12H   Continuous Infusions:  diltiazem (CARDIZEM) infusion 7.5 mg/hr (09/10/23 0922)   heparin  1,200 Units/hr (09/10/23 0102)   PRN Meds: acetaminophen , ALPRAZolam , famotidine , levalbuterol , Muscle Rub, oxyCODONE , sodium chloride  flush, zolpidem    Vital Signs    Vitals:   09/10/23 1110 09/10/23 1123 09/10/23 1147 09/10/23 1202  BP: 132/83   113/76  Pulse: 81  90 96  Resp:    (!) 21  Temp:    98.4 F (36.9 C)  TempSrc:    Oral  SpO2:  90% 99% 99%  Weight:      Height:        Intake/Output Summary (Last 24 hours) at 09/10/2023 1334 Last data filed at 09/10/2023 0930 Gross per 24 hour  Intake 120 ml  Output 2000 ml  Net -1880 ml      09/06/2023   10:27 PM 08/25/2023    5:00 AM 08/23/2023    4:54 AM  Last 3 Weights  Weight (lbs) 174 lb 174 lb 13.2 oz 175 lb 7.8 oz  Weight (kg) 78.926 kg 79.3 kg 79.6 kg      Telemetry    Atrial fibrillation with controlled ventricular rate- Personally Reviewed  ECG    No new tracing - Personally Reviewed  Physical Exam   Constitutional: She appears chronically ill.  Mild respiratory  distress. hemodynamically stable  Neck: No JVD present.  Cardiovascular: S1 normal and S2 normal. An irregularly irregular rhythm present. Tachycardia present.  No murmurs rubs or gallops appreciated secondary to tachycardia  Pulmonary/Chest: No stridor. She has no wheezes. She has no rales.  Rhonchi noted bilateral. Left partial mastectomy  Musculoskeletal:        General: No edema.     Cervical back: Neck supple.  Neurological: She is alert and oriented to person, place, and time.  Skin: Skin is warm.  Ecchymosis   No significant change in physical exam since yesterday  Labs    High Sensitivity Troponin:  No results for input(s): "TROPONINIHS" in the last 720 hours.   Chemistry Recent Labs  Lab 09/06/23 2239 09/07/23 0532 09/07/23 0640 09/08/23 0810 09/08/23 1613 09/09/23 0543 09/10/23 0454  NA 130*  --  129* 126*  --  128* 125*  K 2.8*  --  3.8 4.4  --  3.5 3.6  CL 97*  --  98 97*  --  92* 92*  CO2 23  --  21* 22  --  25 25  GLUCOSE 136*  --  166* 126*  --  138* 114*  BUN 7*  --  7*  15  --  26* 22  CREATININE 0.80  --  0.83 0.74  --  0.90 0.62  CALCIUM  8.7*  --  8.6* 8.2*  --  8.5* 8.1*  MG  --  1.6*  --   --   --  2.2  --   PROT 6.5  --  6.3*  --  6.9  --   --   ALBUMIN  3.4*  --  3.1*  --   --   --   --   AST 16  --  24  --   --   --   --   ALT 11  --  14  --   --   --   --   ALKPHOS 142*  --  122  --   --   --   --   BILITOT 0.6  --  0.6  --   --   --   --   GFRNONAA >60  --  >60 >60  --  >60 >60  ANIONGAP 10  --  10 7  --  11 8    Lipids No results for input(s): "CHOL", "TRIG", "HDL", "LABVLDL", "LDLCALC", "CHOLHDL" in the last 168 hours.  Hematology Recent Labs  Lab 09/08/23 0522 09/09/23 0543 09/10/23 0454  WBC 10.8* 15.5* 13.6*  RBC 3.14* 3.51* 3.25*  HGB 9.8* 10.7* 10.0*  HCT 30.4* 33.5* 30.9*  MCV 96.8 95.4 95.1  MCH 31.2 30.5 30.8  MCHC 32.2 31.9 32.4  RDW 12.3 12.3 12.1  PLT 287 266 225   Thyroid  No results for input(s): "TSH", "FREET4" in  the last 168 hours.  BNP Recent Labs  Lab 09/07/23 0532  BNP 531.6*    DDimer No results for input(s): "DDIMER" in the last 168 hours.   Radiology    CTA chest PE protocol 09/07/2023 1. No evidence of pulmonary embolism. 2. Small patchy right apical and anterior right upper lobe infiltrates. 3. Small right pleural effusion with a moderate size left pleural effusion. 4. Small, stable anterior mediastinal lymph node which corresponds to a hypermetabolic focus seen on the prior nuclear medicine PET/CT. 5. Findings consistent with osseous metastatic disease, as described above. 6. Small hiatal hernia. 7. Aortic atherosclerosis.   Cardiac Studies   Echocardiogram 09/07/2023 LVEF 65 to 70%. No regional wall motion abnormalities. Right ventricular size and function normal. Estimated RVSP 36 mmHg. Left atrium mildly dilated. No mitral stenosis. Trivial AR. Estimated RAP 15 mmHg  Patient Profile     83 y.o. female with a hx of metastatic breast cancer, Crohn's disease s/p ileostomy with reversal, PAF not on anticoagulation, and recent hip fracture s/p repair who is being seen for the evaluation of Afib RVR at the request of Dr. Murrel Arnt.   Assessment & Plan    Atrial fibrillation with controlled ventricular rate Paroxysmal A-fib Has had episodes of RVR during his hospitalization Will restart Cardizem drip Currently on Cardizem 60 mg p.o. 3 times daily, will increase it to 60 mg p.o. every 6 hours with holding parameters with hopes of weaning off Cardizem drip. I am hopeful that she will convert to sinus rhythm as pneumonia improves On IV heparin  for thromboembolic prophylaxis Spoke to the son over the phone Sara Barnes and we discussed patient's CHA2DS2-VASc SCORE with regards to oral anticoagulation for thromboembolic prophylaxis.  We discussed the risks, benefits, and alternatives.  We also discussed patient's fall risk and recent injury requiring surgical intervention.  Patient and son  are leaning towards oral  anticoagulation long-term to reduce the chances of thromboembolic event.  Shared decision was to do CT to reevaluate her prior hematoma to make sure she is not at high risk for bleeding prior to transitioning her from IV heparin  to DOAC.  This was conveyed to attending physician.  Of note, patient and family have refused to see palliative care. Click Here to Calculate/Change CHADS2VASc Score The patient's CHADS2-VASc score is 5, indicating a 7.2% annual risk of stroke.  CHF History: No HTN History: Yes Diabetes History: No Stroke History: No Vascular Disease History: Yes   Acute respiratory distress requiring supplemental oxygen. Sepsis secondary to pneumonia. Metapneumovirus Pleural effusion status postthoracentesis 09/07/2023 Diffuse rhonchi on lung examination.  With mild respiratory distress Defer management to pulmonary medicine and primary  Breast CA w/ metastasis: Followed by medical oncology  Plan of care discussed with attending physician.  For questions or updates, please contact [facility] Please consult [email or web address] for contact info under     Plan of care discussed with the patient, Sara Barnes, son Sara Barnes and attending physician via secure chat.  Prescription drug management.  Care coordinated with nursing staff at bedside as well.  Signed, Awilda Bogus, [facility]  [facility]  Pager: [phone] Office: [phone] 09/10/2023

## 2023-09-10 NOTE — Progress Notes (Signed)
   09/10/23 1147  BiPAP/CPAP/SIPAP  BiPAP/CPAP/SIPAP Pt Type Adult  BiPAP/CPAP/SIPAP SERVO  Mask Type Full face mask  Dentures removed? Not applicable  Mask Size Medium  Set Rate 15 breaths/min  Respiratory Rate 32 breaths/min  IPAP 8 cmH20  EPAP 5 cmH2O  FiO2 (%) 60 %  Minute Ventilation 13.5  Leak 28  Peak Inspiratory Pressure (PIP) 13  Tidal Volume (Vt) 539  Patient Home Machine No  Patient Home Mask No  Patient Home Tubing No  Auto Titrate No  Press High Alarm 25 cmH2O  Press Low Alarm 5 cmH2O  BiPAP/CPAP /SiPAP Vitals  Pulse Rate 90  SpO2 99 %  Bilateral Breath Sounds Expiratory wheezes   Pt placed on bipap due to wheezing and respiratory distress. Md notified of change.

## 2023-09-10 NOTE — Progress Notes (Signed)
 OT Cancellation Note  Patient Details Name: Sara Barnes MRN: [id number] DOB: 1941-01-24   Cancelled Treatment:    Reason Eval/Treat Not Completed: Patient not medically ready. Discussed with RN - pt not medically ready due to HR and recent discussion with cardiology. OT will continue to follow, and re-attempt as able when pt medically appropriate.   Tashanda Fuhrer L. Maicee Ullman, OTR/L  09/10/23, 9:30 AM

## 2023-09-10 NOTE — TOC Progression Note (Signed)
 Transition of Care [facility]) - Progression Note   Patient Details  Name: Sara Barnes MRN: [id number] Date of Birth: [birth year]  [facility] CM/SW Contact  Zenon Hilda, LCSW Phone Number: 09/10/2023, 9:43 AM  Clinical Narrative: TOC awaiting PT/OT evaluations once medically ready to participate.  Expected Discharge Plan: Skilled Nursing Facility (vs. home with [facility]) Barriers to Discharge: Continued Medical Work up  Expected Discharge Plan and Services In-house Referral: Clinical Social Work Living arrangements for the past 2 months: Single Family Home (*at [facility] for ~ 2 weeks just prior to this admission for purpose of rehab)  Social Determinants of Health (SDOH) Interventions SDOH Screenings   Food Insecurity: No Food Insecurity (09/08/2023)  Housing: Low Risk  (09/08/2023)  Transportation Needs: No Transportation Needs (09/08/2023)  Utilities: Not At Risk (09/08/2023)  Alcohol Screen: Low Risk  (03/26/2021)  Depression (PHQ2-9): Low Risk  (03/28/2022)  Financial Resource Strain: Low Risk  (03/26/2021)  Physical Activity: Sufficiently Active (03/26/2021)  Social Connections: Moderately Isolated (09/08/2023)  Stress: No Stress Concern Present (03/26/2021)  Tobacco Use: Low Risk  (09/07/2023)   Readmission Risk Interventions    09/08/2023    3:24 PM  Readmission Risk Prevention Plan  Transportation Screening Complete  PCP or Specialist Appt within 3-5 Days Complete  HRI or Home Care Consult Complete  Social Work Consult for Recovery Care Planning/Counseling Complete  Medication Review Oceanographer) Complete

## 2023-09-11 ENCOUNTER — Inpatient Hospital Stay (HOSPITAL_COMMUNITY)

## 2023-09-11 DIAGNOSIS — J189 Pneumonia, unspecified organism: Secondary | ICD-10-CM | POA: Diagnosis not present

## 2023-09-11 DIAGNOSIS — J9601 Acute respiratory failure with hypoxia: Secondary | ICD-10-CM | POA: Diagnosis not present

## 2023-09-11 DIAGNOSIS — R0603 Acute respiratory distress: Secondary | ICD-10-CM | POA: Diagnosis not present

## 2023-09-11 DIAGNOSIS — J123 Human metapneumovirus pneumonia: Secondary | ICD-10-CM | POA: Diagnosis not present

## 2023-09-11 DIAGNOSIS — C50912 Malignant neoplasm of unspecified site of left female breast: Secondary | ICD-10-CM | POA: Diagnosis not present

## 2023-09-11 LAB — BASIC METABOLIC PANEL WITH GFR
Anion gap: 9 (ref 5–15)
BUN: 21 mg/dL (ref 8–23)
CO2: 24 mmol/L (ref 22–32)
Calcium: 7.9 mg/dL — ABNORMAL LOW (ref 8.9–10.3)
Chloride: 89 mmol/L — ABNORMAL LOW (ref 98–111)
Creatinine, Ser: 0.79 mg/dL (ref 0.44–1.00)
GFR, Estimated: >60 mL/min (ref >60)
Glucose, Bld: 133 mg/dL — ABNORMAL HIGH (ref 70–99)
Potassium: 3.9 mmol/L (ref 3.5–5.1)
Sodium: 122 mmol/L — ABNORMAL LOW (ref 135–145)

## 2023-09-11 LAB — GLUCOSE, CAPILLARY
Glucose-Capillary: 143 mg/dL — ABNORMAL HIGH (ref 70–99)
Glucose-Capillary: 145 mg/dL — ABNORMAL HIGH (ref 70–99)
Glucose-Capillary: 149 mg/dL — ABNORMAL HIGH (ref 70–99)

## 2023-09-11 LAB — CBC
HCT: 29.7 % — ABNORMAL LOW (ref 36.0–46.0)
Hemoglobin: 9.6 g/dL — ABNORMAL LOW (ref 12.0–15.0)
MCH: 30.5 pg (ref 26.0–34.0)
MCHC: 32.3 g/dL (ref 30.0–36.0)
MCV: 94.3 fL (ref 80.0–100.0)
Platelets: 199 10*3/uL (ref 150–400)
RBC: 3.15 MIL/uL — ABNORMAL LOW (ref 3.87–5.11)
RDW: 12.2 % (ref 11.5–15.5)
WBC: 13.1 10*3/uL — ABNORMAL HIGH (ref 4.0–10.5)
nRBC: 0 % (ref 0.0–0.2)

## 2023-09-11 LAB — HEPARIN LEVEL (UNFRACTIONATED)
Heparin Unfractionated: 0.33 [IU]/mL (ref 0.30–0.70)
Heparin Unfractionated: 0.35 [IU]/mL (ref 0.30–0.70)

## 2023-09-11 LAB — SODIUM
Sodium: 124 mmol/L — ABNORMAL LOW (ref 135–145)
Sodium: 121 mmol/L — ABNORMAL LOW (ref 135–145)
Sodium: 122 mmol/L — ABNORMAL LOW (ref 135–145)

## 2023-09-11 LAB — PROCALCITONIN: Procalcitonin: 0.12 ng/mL

## 2023-09-11 MED ORDER — DILTIAZEM HCL ER COATED BEADS 240 MG PO CP24
240.0000 mg | ORAL_CAPSULE | Freq: Every day | ORAL | Status: DC
  Administered 2023-09-11 – 2023-09-19 (×9): 240 mg via ORAL
  Filled 2023-09-11 (×9): qty 1

## 2023-09-11 MED ORDER — NYSTATIN 100000 UNIT/ML MT SUSP
5.0000 mL | Freq: Four times a day (QID) | OROMUCOSAL | Status: DC
  Administered 2023-09-11 – 2023-09-19 (×27): 500000 [IU] via ORAL
  Filled 2023-09-11 (×30): qty 5

## 2023-09-11 MED ORDER — BARIUM SULFATE 2 % PO SUSP
450.0000 mL | ORAL | Status: AC
Start: 2023-09-11 — End: 2023-09-11

## 2023-09-11 NOTE — Progress Notes (Signed)
 PHARMACY - ANTICOAGULATION CONSULT NOTE  Pharmacy Consult for Heparin  Indication: atrial fibrillation  Allergies  Allergen Reactions   Nsaids Other (See Comments)    Crohn's disease = NO NSAIDs   Codeine Nausea And Vomiting   Iodinated Contrast Media     Elevated BP,Itchy skin. Face red in color   Mesalamine  Nausea And Vomiting   Omnipaque  [Iohexol ] Itching    Itching and flushed/red face   Oxycodone  Itching and Rash   Albuterol      Atrial fibrillation RVR   Contrast Allergy Premed Pack [Prednisone  & Diphenhydramine ] Hives    Hives, rash, wheezing and shortness of breath   Lactose Intolerance (Gi) Diarrhea   Norco [Hydrocodone -Acetaminophen ] Nausea Only   Other Itching    PERFUMED LOTIONS  Cant have MRI due to ear implant    Sulfa Antibiotics Rash    Patient Measurements: Height: 5\' 6"  (167.6 cm) Weight: 78.9 kg (174 lb) IBW/kg (Calculated) : 59.3 HEPARIN  DW (KG): 75.6  Vital Signs: Temp: 98.9 F (37.2 C) (05/15 2027) Temp Source: Oral (05/15 2027) BP: 113/69 (05/15 2312) Pulse Rate: 75 (05/15 2312)  Labs: Recent Labs    09/09/23 0543 09/09/23 1521 09/10/23 0454 09/10/23 1251 09/11/23 0345  HGB 10.7*  --  10.0*  --  9.6*  HCT 33.5*  --  30.9*  --  29.7*  PLT 266  --  225  --  199  HEPARINUNFRC  --    < > 0.42 0.28* 0.33  CREATININE 0.90  --  0.62  --  0.79   < > = values in this interval not displayed.    Estimated Creatinine Clearance: 58.4 mL/min (by C-G formula based on SCr of 0.79 mg/dL).  Medications:  Lovenox  40 mg sq daily - LD on 5/9  Assessment:  83 yr female admitted on 5/11 with AFib with RVR.  History of breast cancer.  Discharged 08/25/23 s/p repair of hip fracture.  Has been on Lovenox  40mg  sq daily for VTE prophylaxis CTAngio 5/12 negative for PE  Today, 09/11/2023 HL 0.33 therapeutic on 1300 units/hr Hgb 9.6, plts 199 No bleeding noted per RN   Goal of Therapy:  Heparin  level 0.3-0.7 units/ml Monitor platelets by anticoagulation  protocol: Yes   Plan:  Continue heparin  drip at 1300 units/hr Heparin  level in 8 hours Daily heparin  level & CBC Monitor for signs & symptoms of bleeding   Thank you for allowing pharmacy to be a part of this patient's care.  Beau Bound RPh 09/11/2023, 4:42 AM

## 2023-09-11 NOTE — Progress Notes (Signed)
 Rounding Note    Patient Name: Sara Barnes Date of Encounter: 09/11/2023  [facility] Cardiologist: Olinda Bertrand, DO (new)  Chief Complaint:  Chief Complaint  Patient presents with   Respiratory Distress  Reason of consult: Atrial fibrillation with rapid ventricular rate  Subjective   Accompanied by sister at bedside. Visually appears to be less dyspneic compared to yesterday. No chest pain or heart failure symptoms Patient refused to undergo CT yesterday to reevaluate the presence or absence of her prior liver hematoma  Inpatient Medications    Scheduled Meds:  arformoterol  15 mcg Nebulization BID   Chlorhexidine  Gluconate Cloth  6 each Topical Daily   vitamin D3  1,000 Units Oral Daily   diltiazem  60 mg Oral Q6H   FLUoxetine   20 mg Oral Daily   fluticasone   2 spray Each Nare Daily   guaiFENesin  600 mg Oral BID   methylPREDNISolone  (SOLU-MEDROL ) injection  40 mg Intravenous Daily   revefenacin  175 mcg Nebulization Daily   sodium chloride  flush  10-40 mL Intracatheter Q12H   sodium chloride  flush  3 mL Intravenous Q12H   Continuous Infusions:  diltiazem (CARDIZEM) infusion 5 mg/hr (09/11/23 0816)   heparin  1,300 Units/hr (09/10/23 1847)   PRN Meds: acetaminophen , ALPRAZolam , famotidine , levalbuterol , Muscle Rub, ondansetron  (ZOFRAN ) IV, oxyCODONE , sodium chloride  flush, zolpidem    Vital Signs    Vitals:   09/10/23 2312 09/11/23 0546 09/11/23 0639 09/11/23 0857  BP: 113/69 122/62 106/73   Pulse: 75 (!) 55 66   Resp:  16 18   Temp:  97.7 F (36.5 C) 97.6 F (36.4 C)   TempSrc:  Oral Oral   SpO2:  99% 100% 94%  Weight:      Height:        Intake/Output Summary (Last 24 hours) at 09/11/2023 0932 Last data filed at 09/11/2023 0300 Gross per 24 hour  Intake 941.24 ml  Output 600 ml  Net 341.24 ml      09/06/2023   10:27 PM 08/25/2023    5:00 AM 08/23/2023    4:54 AM  Last 3 Weights  Weight (lbs) 174 lb 174 lb 13.2 oz 175 lb 7.8 oz   Weight (kg) 78.926 kg 79.3 kg 79.6 kg      Telemetry    Atrial fibrillation with controlled ventricular rate- Personally Reviewed  ECG    09/11/2023: Atrial fibrillation with controlled ventricular rate- Personally Reviewed  Physical Exam   Constitutional: She appears chronically ill.  Mild respiratory distress. hemodynamically stable  Neck: No JVD present.  Cardiovascular: S1 normal and S2 normal. An irregularly irregular rhythm present.  Pulmonary/Chest: No stridor. She has wheezes. She has no rales. Rhonchi noted bilateral. Left partial mastectomy  Musculoskeletal:        General: No edema.     Cervical back: Neck supple.  Neurological: She is alert and oriented to person, place, and time.  Skin: Skin is warm.  Ecchymosis    Labs    High Sensitivity Troponin:  No results for input(s): "TROPONINIHS" in the last 720 hours.   Chemistry Recent Labs  Lab 09/06/23 2239 09/07/23 0532 09/07/23 0640 09/08/23 0810 09/08/23 1613 09/09/23 0543 09/10/23 0454 09/11/23 0345  NA 130*  --  129*   < >  --  128* 125* 122*  K 2.8*  --  3.8   < >  --  3.5 3.6 3.9  CL 97*  --  98   < >  --  92*  92* 89*  CO2 23  --  21*   < >  --  25 25 24   GLUCOSE 136*  --  166*   < >  --  138* 114* 133*  BUN 7*  --  7*   < >  --  26* 22 21  CREATININE 0.80  --  0.83   < >  --  0.90 0.62 0.79  CALCIUM  8.7*  --  8.6*   < >  --  8.5* 8.1* 7.9*  MG  --  1.6*  --   --   --  2.2  --   --   PROT 6.5  --  6.3*  --  6.9  --   --   --   ALBUMIN  3.4*  --  3.1*  --   --   --   --   --   AST 16  --  24  --   --   --   --   --   ALT 11  --  14  --   --   --   --   --   ALKPHOS 142*  --  122  --   --   --   --   --   BILITOT 0.6  --  0.6  --   --   --   --   --   GFRNONAA >60  --  >60   < >  --  >60 >60 >60  ANIONGAP 10  --  10   < >  --  11 8 9    < > = values in this interval not displayed.    Lipids No results for input(s): "CHOL", "TRIG", "HDL", "LABVLDL", "LDLCALC", "CHOLHDL" in the last 168 hours.   Hematology Recent Labs  Lab 09/09/23 0543 09/10/23 0454 09/11/23 0345  WBC 15.5* 13.6* 13.1*  RBC 3.51* 3.25* 3.15*  HGB 10.7* 10.0* 9.6*  HCT 33.5* 30.9* 29.7*  MCV 95.4 95.1 94.3  MCH 30.5 30.8 30.5  MCHC 31.9 32.4 32.3  RDW 12.3 12.1 12.2  PLT 266 225 199   Thyroid  No results for input(s): "TSH", "FREET4" in the last 168 hours.  BNP Recent Labs  Lab 09/07/23 0532  BNP 531.6*    DDimer No results for input(s): "DDIMER" in the last 168 hours.   Radiology    CTA chest PE protocol 09/07/2023 1. No evidence of pulmonary embolism. 2. Small patchy right apical and anterior right upper lobe infiltrates. 3. Small right pleural effusion with a moderate size left pleural effusion. 4. Small, stable anterior mediastinal lymph node which corresponds to a hypermetabolic focus seen on the prior nuclear medicine PET/CT. 5. Findings consistent with osseous metastatic disease, as described above. 6. Small hiatal hernia. 7. Aortic atherosclerosis.   Cardiac Studies   Echocardiogram 09/07/2023 LVEF 65 to 70%. No regional wall motion abnormalities. Right ventricular size and function normal. Estimated RVSP 36 mmHg. Left atrium mildly dilated. No mitral stenosis. Trivial AR. Estimated RAP 15 mmHg  Patient Profile     83 y.o. female with a hx of metastatic breast cancer, Crohn's disease s/p ileostomy with reversal, PAF not on anticoagulation, and recent hip fracture s/p repair who is being seen for the evaluation of Afib RVR at the request of Dr. Murrel Arnt.   Assessment & Plan    Atrial fibrillation with controlled ventricular rate Paroxysmal A-fib Has had episodes of RVR during his hospitalization. Patient remains in A-fib but ventricular rates are better controlled.  Will discontinue Cardizem drip. Will transition her to Cardizem to 240 mg p.o. daily I am hopeful that she will convert to sinus rhythm as pneumonia improves On IV heparin  for thromboembolic prophylaxis Spoke to the  son over the phone Mr. Forrest on 09/10/2023 and we discussed patient's CHA2DS2-VASc SCORE and her risk for thromboembolic events. We also discussed the thromboembolic prophylaxis.  We discussed the risks, benefits, and alternatives.  We also discussed patient's fall risk and recent injury requiring surgical intervention.  Patient and son are still leaning towards oral anticoagulation long-term to reduce the chances of thromboembolic event.  Shared decision was to do CT to reevaluate her prior hematoma to make sure she is not at high risk for bleeding prior to transitioning her from IV heparin  to DOAC.  Attending physician tried to arrange radiological studies yesterday 09/09/2020 the patient refused as she felt weak overall.  Final plan with regards to long-term oral anticoagulation is still pending.  Will need to update the family and patient with the CT results for them to make an informed decision. Of note, patient and family have refused to see palliative care. Click Here to Calculate/Change CHADS2VASc Score The patient's CHADS2-VASc score is 5, indicating a 7.2% annual risk of stroke.  CHF History: No HTN History: Yes Diabetes History: No Stroke History: No Vascular Disease History: Yes   Acute respiratory distress requiring supplemental oxygen. Sepsis secondary to pneumonia. Metapneumovirus Pleural effusion status postthoracentesis 09/07/2023 Diffuse rhonchi on lung examination.  With mild respiratory distress Defer management to pulmonary medicine and primary  Breast CA w/ metastasis: Followed by medical oncology  Cardiology will follow with you over the weekend.  For questions or updates, please contact [facility] Please consult [email or web address] for contact info under     Plan of care discussed with the patient, Arnita Laos, son Mr. Forrest and attending physician via secure chat.  Prescription drug management.  Care coordinated with nursing staff at bedside as  well.  Signed, Olinda Bertrand, DO, [facility]  [facility]  09/11/2023

## 2023-09-11 NOTE — Progress Notes (Signed)
 PROGRESS NOTE    Sara Barnes  ZOX:[id number] DOB: 05-Aug-1940 DOA: 09/06/2023 PCP: Estill Hemming, DO    Brief Narrative:  [phone] with h/o Crohn's s/p ileostomy, metastatic breast CA, afib not on AC due to hepatic hematoma, anxiety/depression, and recent hip repair currently in rehab who presented on 5/11 with SOB. While in the ER, she experienced a contrast reaction, as well. She is on BIPAP and was given steroids, benadryl , H2 blocker. She was diagnosed with sepsis due to viral PNA and afib with RVR.  Family resistant to palliative care evaluations and have refused to have further conversations.  Patient is slow to recover.  Patient is willing to consider going to a skilled nursing facility once medically ready (different than prior)   Assessment and Plan: Sepsis secondary to viral pneumonia with respiratory distress from metapneumovirus Patient with recent hospitalization for hip fracture, presenting from SNF rehab with 2 days of URI and worsening respiratory failure  Respiratory panel shows metapneumovirus CXR with marked severity L basilar atelectasis vs. Infiltrate CT with R/ L basilar atelectasis and small patchy R-sided infiltrates, likely multifocal PNA Also with midsternal lymphadenopathy concern for metastasis IV vancomycin  and cefepime for couple days, this has since been DC'd by PCCM.  Would follow procalcitonin to determine if need to be restarted BiPAP -> [place] O2-wean as able Continue nebulizers and steroids -PCCM following-status post thoracentesis with 800 cc removed-cultures negative   Contrast-induced allergic reaction -Treated-- there was ? Of being allergic to the prep as well but seems to tolerate solumedrol  Hyponatremia - Check cortisol and TSH in the a.m. 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 so we will fluid restrict today   A-fib with RVR with h/o paroxysmal atrial fibrillation Developed RVR after administration of  SoluMedrol/Abluterol with EMS Per chart review, patient has history of paroxysmal afib (last in 2018 during hospitalization); family denies knowledge of this Not on anticoagulation due to history of hepatic hematoma in 2022  (resolved) Also not on rate controlling medications Started on IV heparin  drip and IV Cardizem drip-restarted on 5/14 AM-- wean to off Cardiology consulted Echocardiogram unremarkable - Per cardiology he would like to get imaging study of liver prior to starting Eliquis as patient has history of hematoma after a biopsy.  I spoke with radiology--they recommend a CTA or MRI but patient unable to have MRI due to implant and allergy to contrast from the CT scan.  Patient received the prep prior to the CT scan this visit and still had a reaction - Will plan for non contrasted CT scan 5/16. 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 Maryalice Smaller consulted If her functional status does not recover, she will need to stop chemotherapy Hospice-palliative care was offered but family refused F/u with Dr. Maryalice Smaller in 2-3 weeks      Mood d/o Continue  alprazolam , fluoxetine    Crohn's disease On daily prednisone  but this is on hold while on Solumedrol   Questionable thrush - Nystatin swish and swallow   DVT prophylaxis: SCDs Start: 09/07/23 0451 Place TED hose Start:  09/07/23 0451    Code Status: Full Code Family Communication: At bedside  Disposition Plan:  Level of care: Progressive Status is: Inpatient     Consultants:  Oncology Cardiology Palliative care-family refused PCCM    Subjective: Compared to yesterday patient has much more energy and is feeling better, still with cough  Objective: Vitals:   09/11/23 0546 09/11/23 0639 09/11/23 0857 09/11/23 1129  BP: 122/62 106/73  106/66  Pulse: (!) 55 66  65  Resp: 16 18  15   Temp: 97.7 F (36.5 C) 97.6 F (36.4 C)  97.8 F (36.6 C)  TempSrc: Oral Oral  Oral  SpO2: 99% 100% 94% 96%  Weight:      Height:        Intake/Output Summary (Last 24 hours) at 09/11/2023 1306 Last data filed at 09/11/2023 0300 Gross per 24 hour  Intake 941.24 ml  Output 600 ml  Net 341.24 ml   Filed Weights   09/06/23 2227  Weight: 78.9 kg    Examination:   General: Appearance:     Overweight female in no acute distress-who appears fatigued     Lungs:   On [place], occ wheeze, respirations unlabored  Heart:    Normal heart rate.  An irregular   MS:   All extremities are intact.    Neurologic:   Awake, alert, oriented x 3. No apparent focal neurological           defect. -Very hard of hearing       Data Reviewed: I have personally reviewed following labs and imaging studies  CBC: Recent Labs  Lab 09/06/23 2239 09/07/23 0640 09/07/23 0930 09/08/23 0522 09/09/23 0543 09/10/23 0454 09/11/23 0345  WBC 6.0   < > 3.9* 10.8* 15.5* 13.6* 13.1*  NEUTROABS 3.7  --   --   --   --   --   --   HGB 11.0*   < > 10.3* 9.8* 10.7* 10.0* 9.6*  HCT 34.2*   < > 32.6* 30.4* 33.5* 30.9* 29.7*  MCV 96.6   < > 97.6 96.8 95.4 95.1 94.3  PLT 278   < > 269 287 266 225 199   < > = values in this interval not displayed.   Basic Metabolic Panel: Recent Labs  Lab 09/07/23 0532 09/07/23 0640 09/08/23 0810 09/09/23 0543 09/10/23 0454 09/11/23 0345 09/11/23 1137  NA  --  129* 126* 128* 125* 122* 124*  K   --  3.8 4.4 3.5 3.6 3.9  --   CL  --  98 97* 92* 92* 89*  --   CO2  --  21* 22 25 25 24   --   GLUCOSE  --  166* 126* 138* 114* 133*  --   BUN  --  7* 15 26* 22 21  --   CREATININE  --  0.83 0.74 0.90 0.62 0.79  --   CALCIUM   --  8.6* 8.2* 8.5* 8.1* 7.9*  --   MG 1.6*  --   --  2.2  --   --   --    GFR: Estimated Creatinine Clearance: 58.4 mL/min (by C-G formula based on SCr of 0.79 mg/dL). Liver Function Tests: Recent Labs  Lab 09/06/23 2239 09/07/23 0640 09/08/23 1613  AST 16  24  --   ALT 11 14  --   ALKPHOS 142* 122  --   BILITOT 0.6 0.6  --   PROT 6.5 6.3* 6.9  ALBUMIN  3.4* 3.1*  --    No results for input(s): "LIPASE", "AMYLASE" in the last 168 hours. No results for input(s): "AMMONIA" in the last 168 hours. Coagulation Profile: Recent Labs  Lab 09/06/23 2239  INR 1.0   Cardiac Enzymes: No results for input(s): "CKTOTAL", "CKMB", "CKMBINDEX", "TROPONINI" in the last 168 hours. BNP (last 3 results) No results for input(s): "PROBNP" in the last 8760 hours. HbA1C: No results for input(s): "HGBA1C" in the last 72 hours. CBG: Recent Labs  Lab 09/10/23 1159 09/10/23 1703 09/10/23 2349 09/11/23 0547 09/11/23 1157  GLUCAP 114* 113* 187* 143* 145*   Lipid Profile: No results for input(s): "CHOL", "HDL", "LDLCALC", "TRIG", "CHOLHDL", "LDLDIRECT" in the last 72 hours. Thyroid  Function Tests: No results for input(s): "TSH", "T4TOTAL", "FREET4", "T3FREE", "THYROIDAB" in the last 72 hours. Anemia Panel: No results for input(s): "VITAMINB12", "FOLATE", "FERRITIN", "TIBC", "IRON", "RETICCTPCT" in the last 72 hours. Sepsis Labs: Recent Labs  Lab 09/06/23 2239 09/07/23 0407 09/07/23 0930 09/07/23 1723 09/11/23 0345  PROCALCITON  --   --   --   --  0.12  LATICACIDVEN 2.1* 4.1* 2.8* 0.8  --     Recent Results (from the past 240 hours)  Blood Culture (routine x 2)     Status: None (Preliminary result)   Collection Time: 09/06/23 10:38 PM   Specimen: BLOOD  Result  Value Ref Range Status   Specimen Description   Final    BLOOD SITE NOT SPECIFIED Performed at [facility], [address], [place], [place] [address]    Special Requests   Final    BOTTLES DRAWN AEROBIC AND ANAEROBIC Blood Culture results may not be optimal due to an inadequate volume of blood received in culture bottles Performed at [facility], [address], [place], [place] [address]    Culture   Final    NO GROWTH 4 DAYS Performed at [facility], [address] [place], [place] [address]    Report Status PENDING  Incomplete  Resp panel by RT-PCR (RSV, Flu A&B, Covid) Anterior Nasal Swab     Status: None   Collection Time: 09/06/23 11:16 PM   Specimen: Anterior Nasal Swab  Result Value Ref Range Status   SARS Coronavirus 2 by RT PCR NEGATIVE NEGATIVE Final    Comment: (NOTE) SARS-CoV-2 target nucleic acids are NOT DETECTED.  The SARS-CoV-2 RNA is generally detectable in upper respiratory specimens during the acute phase of infection. The lowest concentration of SARS-CoV-2 viral copies this assay can detect is 138 copies/mL. A negative result does not preclude SARS-Cov-2 infection and should not be used as the sole basis for treatment or other patient management decisions. A negative result may occur with  improper specimen collection/handling, submission of specimen other than nasopharyngeal swab, presence of viral mutation(s) within the areas targeted by this assay, and inadequate number of viral copies(<138 copies/mL). A negative result must be combined with clinical observations, patient history, and epidemiological information. The expected result is Negative.  Fact Sheet for Patients:  BloggerCourse.com  Fact Sheet for Healthcare Providers:  SeriousBroker.it  This test is no t yet approved or cleared by the [place]  FDA and  has been authorized for detection  and/or diagnosis of SARS-CoV-2 by FDA under an Emergency Use Authorization (EUA). This EUA  will remain  in effect (meaning this test can be used) for the duration of the COVID-19 declaration under Section 564(b)(1) of the Act, 21 U.S.C.section 360bbb-3(b)(1), unless the authorization is terminated  or revoked sooner.       Influenza A by PCR NEGATIVE NEGATIVE Final   Influenza B by PCR NEGATIVE NEGATIVE Final    Comment: (NOTE) The Xpert Xpress SARS-CoV-2/FLU/RSV plus assay is intended as an aid in the diagnosis of influenza from Nasopharyngeal swab specimens and should not be used as a sole basis for treatment. Nasal washings and aspirates are unacceptable for Xpert Xpress SARS-CoV-2/FLU/RSV testing.  Fact Sheet for Patients: BloggerCourse.com  Fact Sheet for Healthcare Providers: SeriousBroker.it  This test is not yet approved or cleared by the [place]  FDA and has been authorized for detection and/or diagnosis of SARS-CoV-2 by FDA under an Emergency Use Authorization (EUA). This EUA will remain in effect (meaning this test can be used) for the duration of the COVID-19 declaration under Section 564(b)(1) of the Act, 21 U.S.C. section 360bbb-3(b)(1), unless the authorization is terminated or revoked.     Resp Syncytial Virus by PCR NEGATIVE NEGATIVE Final    Comment: (NOTE) Fact Sheet for Patients: BloggerCourse.com  Fact Sheet for Healthcare Providers: SeriousBroker.it  This test is not yet approved or cleared by the [place]  FDA and has been authorized for detection and/or diagnosis of SARS-CoV-2 by FDA under an Emergency Use Authorization (EUA). This EUA will remain in effect (meaning this test can be used) for the duration of the COVID-19 declaration under Section 564(b)(1) of the Act, 21 U.S.C. section 360bbb-3(b)(1), unless the authorization is terminated  or revoked.  Performed at [facility], [address], [place], [place] [address]   Blood Culture (routine x 2)     Status: None (Preliminary result)   Collection Time: 09/06/23 11:27 PM   Specimen: BLOOD RIGHT ARM  Result Value Ref Range Status   Specimen Description   Final    BLOOD RIGHT ARM Performed at [facility], [address] [place], [place] [address]    Special Requests   Final    BOTTLES DRAWN AEROBIC ONLY Blood Culture adequate volume Performed at [facility], [address] [place], [place] [address]    Culture   Final    NO GROWTH 4 DAYS Performed at [facility], [address] [place], [place] [address]    Report Status PENDING  Incomplete  MRSA Next Gen by PCR, Nasal     Status: None   Collection Time: 09/07/23 12:30 PM   Specimen: Nasal Mucosa; Nasal Swab  Result Value Ref Range Status   MRSA by PCR Next Gen NOT DETECTED NOT DETECTED Final    Comment: (NOTE) The GeneXpert MRSA Assay (FDA approved for NASAL specimens only), is one component of a comprehensive MRSA colonization surveillance program. It is not intended to diagnose MRSA infection nor to guide or monitor treatment for MRSA infections. Test performance is not FDA approved in patients less than 3 years old. Performed at [facility], [address], [place], [place] [address]   Respiratory (~20 pathogens) panel by PCR     Status: Abnormal   Collection Time: 09/08/23  3:49 PM   Specimen: Nasopharyngeal Swab; Respiratory  Result Value Ref Range Status   Adenovirus NOT DETECTED NOT DETECTED Final   Coronavirus 229E NOT DETECTED NOT DETECTED Final    Comment: (NOTE) The Coronavirus on the Respiratory Panel,  DOES NOT test for the novel  Coronavirus (2019 nCoV)    Coronavirus HKU1 NOT DETECTED NOT DETECTED Final   Coronavirus NL63 NOT DETECTED NOT DETECTED Final   Coronavirus OC43 NOT DETECTED NOT DETECTED Final    Metapneumovirus DETECTED (A) NOT DETECTED Final   Rhinovirus / Enterovirus NOT DETECTED NOT DETECTED Final   Influenza A NOT DETECTED NOT DETECTED Final   Influenza B NOT DETECTED NOT DETECTED Final   Parainfluenza Virus 1 NOT DETECTED NOT DETECTED Final   Parainfluenza Virus 2 NOT DETECTED NOT DETECTED Final   Parainfluenza Virus 3 NOT DETECTED NOT DETECTED Final   Parainfluenza Virus 4 NOT DETECTED NOT DETECTED Final   Respiratory Syncytial Virus NOT DETECTED NOT DETECTED Final   Bordetella pertussis NOT DETECTED NOT DETECTED Final   Bordetella Parapertussis NOT DETECTED NOT DETECTED Final   Chlamydophila pneumoniae NOT DETECTED NOT DETECTED Final   Mycoplasma pneumoniae NOT DETECTED NOT DETECTED Final    Comment: Performed at [facility], [address], [place], [place] [address]  Body fluid culture w Gram Stain     Status: None (Preliminary result)   Collection Time: 09/08/23  4:00 PM   Specimen: Thoracentesis; Body Fluid  Result Value Ref Range Status   Specimen Description   Final    THORACENTESIS Performed at [facility], [address] [place], [place] [address]    Special Requests   Final    NONE Performed at [facility], [address] [place], [place] [address]    Gram Stain NO WBC SEEN NO ORGANISMS SEEN   Final   Culture   Final    NO GROWTH 2 DAYS Performed at [facility], [address], [place], [place] [address]    Report Status PENDING  Incomplete         Radiology Studies: No results found.       Scheduled Meds:  arformoterol  15 mcg Nebulization BID   Chlorhexidine  Gluconate Cloth  6 each Topical Daily   vitamin D3  1,000 Units Oral Daily   diltiazem  240 mg Oral Daily   FLUoxetine   20 mg Oral Daily   fluticasone   2 spray Each Nare Daily   guaiFENesin  600 mg Oral BID   methylPREDNISolone  (SOLU-MEDROL ) injection  40 mg Intravenous Daily   nystatin  5 mL Oral QID   revefenacin  175 mcg  Nebulization Daily   sodium chloride  flush  10-40 mL Intracatheter Q12H   sodium chloride  flush  3 mL Intravenous Q12H   Continuous Infusions:  heparin  1,300 Units/hr (09/10/23 1847)     LOS: 4 days    Time spent: 45 minutes spent on chart review, discussion with nursing staff, consultants, updating family and interview/physical exam; more than 50% of that time was spent in counseling and/or coordination of care.    Enrigue Harvard, DO [facility] Available via Epic secure chat 7am-7pm After these hours, please refer to coverage provider listed on [email or web address] 09/11/2023, 1:06 PM

## 2023-09-11 NOTE — Progress Notes (Signed)
   09/11/23 2058  BiPAP/CPAP/SIPAP  BiPAP/CPAP/SIPAP Pt Type Adult  BiPAP/CPAP/SIPAP SERVO  Reason BIPAP/CPAP not in use Other(comment) (Not needed at this time)

## 2023-09-11 NOTE — Progress Notes (Signed)
 PHARMACY - ANTICOAGULATION CONSULT NOTE  Pharmacy Consult for Heparin  Indication: atrial fibrillation  Allergies  Allergen Reactions   Nsaids Other (See Comments)    Crohn's disease = NO NSAIDs   Codeine Nausea And Vomiting   Iodinated Contrast Media     Elevated BP,Itchy skin. Face red in color   Mesalamine  Nausea And Vomiting   Omnipaque  [Iohexol ] Itching    Itching and flushed/red face   Oxycodone  Itching and Rash   Albuterol      Atrial fibrillation RVR   Contrast Allergy Premed Pack [Prednisone  & Diphenhydramine ] Hives    Hives, rash, wheezing and shortness of breath   Lactose Intolerance (Gi) Diarrhea   Norco [Hydrocodone -Acetaminophen ] Nausea Only   Other Itching    PERFUMED LOTIONS  Cant have MRI due to ear implant    Sulfa Antibiotics Rash    Patient Measurements: Height: 5\' 6"  (167.6 cm) Weight: 78.9 kg (174 lb) IBW/kg (Calculated) : 59.3 HEPARIN  DW (KG): 75.6  Vital Signs: Temp: 97.8 F (36.6 C) (05/16 1129) Temp Source: Oral (05/16 1129) BP: 106/66 (05/16 1129) Pulse Rate: 65 (05/16 1129)  Labs: Recent Labs    09/09/23 0543 09/09/23 1521 09/10/23 0454 09/10/23 1251 09/11/23 0345 09/11/23 1137  HGB 10.7*  --  10.0*  --  9.6*  --   HCT 33.5*  --  30.9*  --  29.7*  --   PLT 266  --  225  --  199  --   HEPARINUNFRC  --    < > 0.42 0.28* 0.33 0.35  CREATININE 0.90  --  0.62  --  0.79  --    < > = values in this interval not displayed.    Estimated Creatinine Clearance: 58.4 mL/min (by C-G formula based on SCr of 0.79 mg/dL).  Medications:  Lovenox  40 mg sq daily - LD on 5/9  Assessment:  83 yr female admitted on 5/11 with AFib with RVR.  History of breast cancer.  Discharged 08/25/23 s/p repair of hip fracture.  Has been on Lovenox  40mg  sq daily for VTE prophylaxis CTAngio 5/12 negative for PE  Today, 09/11/2023 03:45 HL 0.33 therapeutic on 1300 units/hr 11:37 Confirmatory HL 0.35 therapeutic on IV heparin  1300 units/hr Hgb 9.6 (stable), plts  199 No bleeding noted per RN   Goal of Therapy:  Heparin  level 0.3-0.7 units/ml Monitor platelets by anticoagulation protocol: Yes   Plan:  Continue heparin  drip at 1300 units/hr Daily heparin  level & CBC Monitor for signs & symptoms of bleeding   Thank you for allowing pharmacy to be a part of this patient's care.  Alfredo Inch, PharmD, BCPS Clinical Pharmacist [place] 09/11/2023 12:25 PM

## 2023-09-11 NOTE — Progress Notes (Addendum)
 OT Cancellation Note  Patient Details Name: Sara Barnes MRN: [id number] DOB: 1940-06-14   Cancelled Treatment:    Reason Eval/Treat Not Completed: Patient not medically ready. Chart reviewed, pt currently with Na below 125 and awaiting CT scan per cardiology. OT has attempted 3x to see pt, and has been unable due to medical complications. Will complete orders at this time and await new orders as appropriate. Thank you for the referral.   Bridgette Campus L. Cannie Muckle, OTR/L  09/11/23, 8:36 AM

## 2023-09-11 NOTE — Progress Notes (Signed)
 PT Cancellation Note  Patient Details Name: Sara Barnes MRN: [id number] DOB: 1940-10-18   Cancelled Treatment:    Reason Eval/Treat Not Completed: Medical issues which prohibited therapy   Magdalen Cabana Kerstine 09/11/2023, 9:38 AM

## 2023-09-12 DIAGNOSIS — R0603 Acute respiratory distress: Secondary | ICD-10-CM | POA: Diagnosis not present

## 2023-09-12 LAB — CBC
HCT: 28.9 % — ABNORMAL LOW (ref 36.0–46.0)
Hemoglobin: 9.2 g/dL — ABNORMAL LOW (ref 12.0–15.0)
MCH: 30.3 pg (ref 26.0–34.0)
MCHC: 31.8 g/dL (ref 30.0–36.0)
MCV: 95.1 fL (ref 80.0–100.0)
Platelets: 220 10*3/uL (ref 150–400)
RBC: 3.04 MIL/uL — ABNORMAL LOW (ref 3.87–5.11)
RDW: 12.1 % (ref 11.5–15.5)
WBC: 14 10*3/uL — ABNORMAL HIGH (ref 4.0–10.5)
nRBC: 0 % (ref 0.0–0.2)

## 2023-09-12 LAB — GLUCOSE, CAPILLARY
Glucose-Capillary: 141 mg/dL — ABNORMAL HIGH (ref 70–99)
Glucose-Capillary: 129 mg/dL — ABNORMAL HIGH (ref 70–99)
Glucose-Capillary: 167 mg/dL — ABNORMAL HIGH (ref 70–99)

## 2023-09-12 LAB — BODY FLUID CULTURE W GRAM STAIN
Culture: NO GROWTH
Gram Stain: NONE SEEN

## 2023-09-12 LAB — CULTURE, BLOOD (ROUTINE X 2)
Culture: NO GROWTH
Culture: NO GROWTH
Special Requests: ADEQUATE

## 2023-09-12 LAB — BASIC METABOLIC PANEL WITH GFR
Anion gap: 7 (ref 5–15)
BUN: 29 mg/dL — ABNORMAL HIGH (ref 8–23)
CO2: 27 mmol/L (ref 22–32)
Calcium: 8.4 mg/dL — ABNORMAL LOW (ref 8.9–10.3)
Chloride: 93 mmol/L — ABNORMAL LOW (ref 98–111)
Creatinine, Ser: 0.89 mg/dL (ref 0.44–1.00)
GFR, Estimated: >60 mL/min (ref >60)
Glucose, Bld: 131 mg/dL — ABNORMAL HIGH (ref 70–99)
Potassium: 4.3 mmol/L (ref 3.5–5.1)
Sodium: 127 mmol/L — ABNORMAL LOW (ref 135–145)

## 2023-09-12 LAB — HEPARIN LEVEL (UNFRACTIONATED): Heparin Unfractionated: 0.36 [IU]/mL (ref 0.30–0.70)

## 2023-09-12 LAB — CORTISOL: Cortisol, Plasma: 1.9 ug/dL

## 2023-09-12 LAB — PROCALCITONIN: Procalcitonin: <0.1 ng/mL

## 2023-09-12 LAB — SODIUM: Sodium: 127 mmol/L — ABNORMAL LOW (ref 135–145)

## 2023-09-12 MED ORDER — APIXABAN 5 MG PO TABS
5.0000 mg | ORAL_TABLET | Freq: Two times a day (BID) | ORAL | Status: DC
  Administered 2023-09-12 – 2023-09-16 (×9): 5 mg via ORAL
  Filled 2023-09-12 (×9): qty 1

## 2023-09-12 NOTE — Discharge Instructions (Signed)

## 2023-09-12 NOTE — Progress Notes (Signed)
 Pt is refusing BiPAP at this time. Pt currently on 5L HFNC and SPO2 is 96%. Pt is tolerating well with no distress noted. BiPAP in room on standby. Will continue to monitor pt     09/12/23 2034  BiPAP/CPAP/SIPAP  BiPAP/CPAP/SIPAP Pt Type Adult  BiPAP/CPAP/SIPAP SERVO  Reason BIPAP/CPAP not in use Other(comment) (Pt refusing at this time. BIPAP in room on standby.)  CPAP/SIPAP surface wiped down Yes  Device Plugged into RED Power Outlet Yes  BiPAP/CPAP /SiPAP Vitals  Pulse Rate 96  Resp 18  SpO2 96 %  Bilateral Breath Sounds Expiratory wheezes  MEWS Score/Color  MEWS Score 0  MEWS Score Color Marrie Sizer

## 2023-09-12 NOTE — Progress Notes (Addendum)
 Rounding Note    Patient Name: Sara Barnes Date of Encounter: 09/12/2023  [facility] Cardiologist: Olinda Bertrand, DO (new)  Chief Complaint:  Chief Complaint  Patient presents with   Respiratory Distress  Reason of consult: Atrial fibrillation with rapid ventricular rate  Subjective   Accompanied by sister at bedside.  She is resting now -- has not slept well, so I did not wake her. Sister reports that she had some agitation earlier.   Inpatient Medications    Scheduled Meds:  arformoterol   15 mcg Nebulization BID   Chlorhexidine  Gluconate Cloth  6 each Topical Daily   vitamin D3  1,000 Units Oral Daily   diltiazem   240 mg Oral Daily   FLUoxetine   20 mg Oral Daily   fluticasone   2 spray Each Nare Daily   guaiFENesin   600 mg Oral BID   methylPREDNISolone  (SOLU-MEDROL ) injection  40 mg Intravenous Daily   nystatin   5 mL Oral QID   revefenacin   175 mcg Nebulization Daily   sodium chloride  flush  10-40 mL Intracatheter Q12H   sodium chloride  flush  3 mL Intravenous Q12H   Continuous Infusions:  heparin  1,300 Units/hr (09/11/23 1528)   PRN Meds: acetaminophen , ALPRAZolam , famotidine , levalbuterol , Muscle Rub, ondansetron  (ZOFRAN ) IV, oxyCODONE , sodium chloride  flush, zolpidem    Vital Signs    Vitals:   09/11/23 1949 09/12/23 0230 09/12/23 0739 09/12/23 0753  BP: 116/65 117/66  124/72  Pulse: 78 71  69  Resp:    16  Temp: 97.8 F (36.6 C)   97.6 F (36.4 C)  TempSrc: Oral   Axillary  SpO2: 95% 99% 100% 100%  Weight:      Height:        Intake/Output Summary (Last 24 hours) at 09/12/2023 1247 Last data filed at 09/12/2023 0900 Gross per 24 hour  Intake 1260 ml  Output 850 ml  Net 410 ml      09/06/2023   10:27 PM 08/25/2023    5:00 AM 08/23/2023    4:54 AM  Last 3 Weights  Weight (lbs) 174 lb 174 lb 13.2 oz 175 lb 7.8 oz  Weight (kg) 78.926 kg 79.3 kg 79.6 kg      Telemetry    Atrial fibrillation with controlled ventricular rate-  Personally Reviewed  ECG    09/11/2023: Atrial fibrillation with controlled ventricular rate- Personally Reviewed  Physical Exam   Constitutional: She appears chronically ill.   Neck: No JVD present.  Left partial mastectomy  Musculoskeletal:        General: No edema.      Neurological: Sleeping    Labs    High Sensitivity Troponin:  No results for input(s): "TROPONINIHS" in the last 720 hours.   Chemistry Recent Labs  Lab 09/06/23 2239 09/07/23 0532 09/07/23 4098 09/08/23 0810 09/08/23 1613 09/09/23 0543 09/10/23 0454 09/11/23 0345 09/11/23 1137 09/11/23 1659 09/11/23 2254 09/12/23 0410  NA 130*  --  129*   < >  --  128* 125* 122*   < > 121* 122* 127*  127*  K 2.8*  --  3.8   < >  --  3.5 3.6 3.9  --   --   --  4.3  CL 97*  --  98   < >  --  92* 92* 89*  --   --   --  93*  CO2 23  --  21*   < >  --  25 25 24   --   --   --  27  GLUCOSE 136*  --  166*   < >  --  138* 114* 133*  --   --   --  131*  BUN 7*  --  7*   < >  --  26* 22 21  --   --   --  29*  CREATININE 0.80  --  0.83   < >  --  0.90 0.62 0.79  --   --   --  0.89  CALCIUM  8.7*  --  8.6*   < >  --  8.5* 8.1* 7.9*  --   --   --  8.4*  MG  --  1.6*  --   --   --  2.2  --   --   --   --   --   --   PROT 6.5  --  6.3*  --  6.9  --   --   --   --   --   --   --   ALBUMIN  3.4*  --  3.1*  --   --   --   --   --   --   --   --   --   AST 16  --  24  --   --   --   --   --   --   --   --   --   ALT 11  --  14  --   --   --   --   --   --   --   --   --   ALKPHOS 142*  --  122  --   --   --   --   --   --   --   --   --   BILITOT 0.6  --  0.6  --   --   --   --   --   --   --   --   --   GFRNONAA >60  --  >60   < >  --  >60 >60 >60  --   --   --  >60  ANIONGAP 10  --  10   < >  --  11 8 9   --   --   --  7   < > = values in this interval not displayed.    Lipids No results for input(s): "CHOL", "TRIG", "HDL", "LABVLDL", "LDLCALC", "CHOLHDL" in the last 168 hours.  Hematology Recent Labs  Lab 09/10/23 0454  09/11/23 0345 09/12/23 0410  WBC 13.6* 13.1* 14.0*  RBC 3.25* 3.15* 3.04*  HGB 10.0* 9.6* 9.2*  HCT 30.9* 29.7* 28.9*  MCV 95.1 94.3 95.1  MCH 30.8 30.5 30.3  MCHC 32.4 32.3 31.8  RDW 12.1 12.2 12.1  PLT 225 199 220   Thyroid  No results for input(s): "TSH", "FREET4" in the last 168 hours.  BNP Recent Labs  Lab 09/07/23 0532  BNP 531.6*    DDimer No results for input(s): "DDIMER" in the last 168 hours.   Radiology    CTA chest PE protocol 09/07/2023 1. No evidence of pulmonary embolism. 2. Small patchy right apical and anterior right upper lobe infiltrates. 3. Small right pleural effusion with a moderate size left pleural effusion. 4. Small, stable anterior mediastinal lymph node which corresponds to a hypermetabolic focus seen on the prior nuclear medicine PET/CT. 5. Findings consistent with osseous metastatic disease, as described above. 6. Small hiatal hernia.  7. Aortic atherosclerosis.   Cardiac Studies   Echocardiogram 09/07/2023 LVEF 65 to 70%. No regional wall motion abnormalities. Right ventricular size and function normal. Estimated RVSP 36 mmHg. Left atrium mildly dilated. No mitral stenosis. Trivial AR. Estimated RAP 15 mmHg  Patient Profile     83 y.o. female with a hx of metastatic breast cancer, Crohn's disease s/p ileostomy with reversal, PAF not on anticoagulation, and recent hip fracture s/p repair who is being seen for the evaluation of Afib RVR at the request of Dr. Murrel Arnt.   Assessment & Plan    Atrial fibrillation with controlled ventricular rate Paroxysmal A-fib Has had episodes of RVR during his hospitalization. Patient remains in A-fib but ventricular rates are better controlled. Will discontinue Cardizem  drip. Will transition her to Cardizem  to 240 mg p.o. daily I am hopeful that she will convert to sinus rhythm as pneumonia improves No obvious hematoma on CT. Would DC heparin  drip and start eliquis 5mg  PO BID    Prior conversation from  Dr. Albert Huff: "Spoke to the son over the phone Mr. Forrest on 09/10/2023 and we discussed patient's CHA2DS2-VASc SCORE and her risk for thromboembolic events. We also discussed the thromboembolic prophylaxis.  We discussed the risks, benefits, and alternatives.  We also discussed patient's fall risk and recent injury requiring surgical intervention.  Patient and son are still leaning towards oral anticoagulation long-term to reduce the chances of thromboembolic event.  Shared decision was to do CT to reevaluate her prior hematoma to make sure she is not at high risk for bleeding prior to transitioning her from IV heparin  to DOAC.  Attending physician tried to arrange radiological studies yesterday 09/09/2020 the patient refused as she felt weak overall.  Final plan with regards to long-term oral anticoagulation is still pending.  Will need to update the family and patient with the CT results for them to make an informed decision. Of note, patient and family have refused to see palliative care."  Click Here to Calculate/Change CHADS2VASc Score The patient's CHADS2-VASc score is 5, indicating a 7.2% annual risk of stroke.  CHF History: No HTN History: Yes Diabetes History: No Stroke History: No Vascular Disease History: Yes   Acute respiratory distress requiring supplemental oxygen. Sepsis secondary to pneumonia. Metapneumovirus Pleural effusion status postthoracentesis 09/07/2023 Diffuse rhonchi on lung examination.  With mild respiratory distress Defer management to pulmonary medicine and primary  Breast CA w/ metastasis: Followed by medical oncology   For questions or updates, please contact [facility] Please consult [email or web address] for contact info under     No further hospital needs per cardiology. We will sign off. Please call with questions.   Signed, Efraim Grange, MD 09/12/2023

## 2023-09-12 NOTE — Evaluation (Signed)
 Physical Therapy Evaluation Patient Details Name: MACAYLA EKDAHL MRN: [id number] DOB: February 27, 1941 Today's Date: 09/12/2023  History of Present Illness  Zakira Ressel is an 83 yo female admitted to [facility] from [facility] with respiratory distress. Dx with sepsis due to PNA, Afib, RVR; while in ED had contrast reaction. Thoracentesis 09/08/23. Pt s/p L ORIF from hip fx on 08/14/23 and sent to rehab at [facility]. PMH: c-diff, ileostomy and revision 2019, arthritis, anxiety, anemia, basal cell carcinoma, L breast Ca, GERD, lung Ca, hyperlipidemia, depression, Crohn's disease  Clinical Impression  Pt admitted with above diagnosis from [facility] where she was receiving therapy from her L hip ORIF 4.18.25; pt was ambulating with RW with therapy but spent most of her time in her WC, required assist for mobility and ADLs..  Pt currently with functional limitations due to the deficits listed below (see PT Problem List). Pt performed bed mobility with minA for trunk elevation and bringing BLE off bed, pt able to sit EOB with SUP ~77min. Pt performed STS and SPT with RW with Cga only, no physical assist, distance limited by fatigue SOB, pt on 5LO2 via Merkel and SpO2 ranged 92-96% throughout, pt with productive cough as well. Pt and sister would very much like to go home with [facility] services, educated pt and sister about nature of level of assist currently. Added pt to mobility specialist caseload. Pt in recliner with all needs met at end of session. Pt will benefit from acute skilled PT to increase their independence and safety with mobility to allow discharge. Patient will benefit from continued inpatient follow up therapy, <3 hours/day       If plan is discharge home, recommend the following: A little help with walking and/or transfers;A lot of help with bathing/dressing/bathroom;Assistance with cooking/housework;Assist for transportation;Help with stairs or ramp for entrance   Can travel by private vehicle   Yes     Equipment Recommendations None recommended by PT (TBD at SNF)  Recommendations for Other Services       Functional Status Assessment Patient has had a recent decline in their functional status and demonstrates the ability to make significant improvements in function in a reasonable and predictable amount of time.     Precautions / Restrictions Precautions Precautions: Fall Restrictions Weight Bearing Restrictions Per Provider Order: No LLE Weight Bearing Per Provider Order: Weight bearing as tolerated      Mobility  Bed Mobility Overal bed mobility: Needs Assistance Bed Mobility: Supine to Sit     Supine to sit: Min assist, HOB elevated, Used rails     General bed mobility comments: assist for trunk elevation, LLE progression to EOB.    Transfers Overall transfer level: Needs assistance Equipment used: Rolling walker (2 wheels) Transfers: Sit to/from Stand Sit to Stand: Min assist   Step pivot transfers: Min assist       General transfer comment: close guard for safety when rising from elevated surface, min boost when rising from low seat.    Ambulation/Gait               General Gait Details: Unsafe, pt too fatigued  Stairs            Wheelchair Mobility     Tilt Bed    Modified Rankin (Stroke Patients Only)       Balance Overall balance assessment: Needs assistance Sitting-balance support: No upper extremity supported, Feet supported Sitting balance-Leahy Scale: Good     Standing balance support: Bilateral upper extremity  supported, During functional activity, Reliant on assistive device for balance Standing balance-Leahy Scale: Poor Standing balance comment: Reliant on RW                             Pertinent Vitals/Pain Pain Assessment Pain Assessment: Faces Faces Pain Scale: Hurts little more Breathing: normal Pain Location: L hip Pain Descriptors / Indicators: Sore, Guarding, Grimacing Pain Intervention(s):  Monitored during session, Repositioned    Home Living Family/patient expects to be discharged to:: Unsure Living Arrangements: Alone Available Help at Discharge: Family;Available 24 hours/day (Sister [name]) Type of Home: House (townhouse) Home Access: Stairs to enter Entrance Stairs-Rails: None Entrance Stairs-Number of Steps: 4-5 (no railing) Alternate Level Stairs-Number of Steps: flight Home Layout: Able to live on main level with bedroom/bathroom (installing walk in shower, takes 2 week) Home Equipment: Cane - single point;Rolling Walker (2 wheels);Educational psychologist (4 wheels) Additional Comments: Pt would very much like to go home with HHT    Prior Function Prior Level of Function : Needs assist;History of Falls (last six months)       Physical Assist : Mobility (physical);ADLs (physical) Mobility (physical): Bed mobility;Transfers;Gait ADLs (physical): Bathing;Toileting;Dressing Mobility Comments: Ambulates with RW with therapy at SNF, uses WC rest of day, requires assist with transfers ADLs Comments: Requires assist for bathing at [facility]     Extremity/Trunk Assessment   Upper Extremity Assessment Upper Extremity Assessment: Overall WFL for tasks assessed    Lower Extremity Assessment Lower Extremity Assessment: Generalized weakness    Cervical / Trunk Assessment Cervical / Trunk Assessment: Normal  Communication   Communication Communication: Impaired Factors Affecting Communication: Hearing impaired    Cognition Arousal: Alert Behavior During Therapy: WFL for tasks assessed/performed   PT - Cognitive impairments: No apparent impairments                       PT - Cognition Comments: sequencing and safety cues throughout mobility Following commands: Intact       Cueing Cueing Techniques: Verbal cues     General Comments General comments (skin integrity, edema, etc.): Sister [name] present and assists with PLOF and communication    Exercises      Assessment/Plan    PT Assessment Patient needs continued PT services  PT Problem List Decreased strength;Decreased activity tolerance;Decreased balance;Decreased mobility;Decreased knowledge of use of DME;Pain;Decreased range of motion       PT Treatment Interventions DME instruction;Gait training;Functional mobility training;Stair training;Therapeutic activities;Therapeutic exercise;Balance training;Neuromuscular re-education;Patient/family education    PT Goals (Current goals can be found in the Care Plan section)  Acute Rehab PT Goals Patient Stated Goal: To go home PT Goal Formulation: With patient Time For Goal Achievement: 09/26/23 Potential to Achieve Goals: Good    Frequency Min 1X/week     Co-evaluation               AM-PAC PT "6 Clicks" Mobility  Outcome Measure Help needed turning from your back to your side while in a flat bed without using bedrails?: A Little Help needed moving from lying on your back to sitting on the side of a flat bed without using bedrails?: A Little Help needed moving to and from a bed to a chair (including a wheelchair)?: A Little Help needed standing up from a chair using your arms (e.g., wheelchair or bedside chair)?: A Little Help needed to walk in hospital room?: A Little Help needed climbing 3-5 steps with a railing? :  A Lot 6 Click Score: 17    End of Session Equipment Utilized During Treatment: Gait belt;Oxygen (4LO2 via ) Activity Tolerance: Patient limited by fatigue Patient left: in chair;with call bell/phone within reach;with chair alarm set;with family/visitor present Nurse Communication: Mobility status PT Visit Diagnosis: Muscle weakness (generalized) (M62.81);History of falling (Z91.81);Difficulty in walking, not elsewhere classified (R26.2)    Time: 1000-1048 PT Time Calculation (min) (ACUTE ONLY): 48 min   Charges:   PT Evaluation $PT Eval Low Complexity: 1 Low PT Treatments $Therapeutic Activity: 23-37  mins PT General Charges $$ ACUTE PT VISIT: 1 Visit         Jerrye Mori, PT, DPT [facility] Department Office: [phone]  Jerrye Mori 09/12/2023, 1:23 PM

## 2023-09-12 NOTE — Progress Notes (Signed)
 PHARMACY - ANTICOAGULATION CONSULT NOTE  Pharmacy Consult for Heparin  Indication: atrial fibrillation  Allergies  Allergen Reactions   Nsaids Other (See Comments)    Crohn's disease = NO NSAIDs   Codeine Nausea And Vomiting   Iodinated Contrast Media     Elevated BP,Itchy skin. Face red in color   Mesalamine  Nausea And Vomiting   Omnipaque  [Iohexol ] Itching    Itching and flushed/red face   Oxycodone  Itching and Rash   Albuterol      Atrial fibrillation RVR   Contrast Allergy Premed Pack [Prednisone  & Diphenhydramine ] Hives    Hives, rash, wheezing and shortness of breath   Lactose Intolerance (Gi) Diarrhea   Norco [Hydrocodone -Acetaminophen ] Nausea Only   Other Itching    PERFUMED LOTIONS  Cant have MRI due to ear implant    Sulfa Antibiotics Rash    Patient Measurements: Height: 5\' 6"  (167.6 cm) Weight: 78.9 kg (174 lb) IBW/kg (Calculated) : 59.3 HEPARIN  DW (KG): 75.6  Vital Signs: Temp: 97.8 F (36.6 C) (05/16 1949) Temp Source: Oral (05/16 1949) BP: 117/66 (05/17 0230) Pulse Rate: 71 (05/17 0230)  Labs: Recent Labs    09/09/23 0543 09/09/23 1521 09/10/23 0454 09/10/23 1251 09/11/23 0345 09/11/23 1137 09/12/23 0410  HGB 10.7*  --  10.0*  --  9.6*  --  9.2*  HCT 33.5*  --  30.9*  --  29.7*  --  28.9*  PLT 266  --  225  --  199  --  220  HEPARINUNFRC  --    < > 0.42   < > 0.33 0.35 0.36  CREATININE 0.90  --  0.62  --  0.79  --   --    < > = values in this interval not displayed.    Estimated Creatinine Clearance: 58.4 mL/min (by C-G formula based on SCr of 0.79 mg/dL).  Medications:  Lovenox  40 mg sq daily - LD on 5/9  Assessment:  83 yr female admitted on 5/11 with AFib with RVR.  History of breast cancer.  Discharged 08/25/23 s/p repair of hip fracture.  Has been on Lovenox  40mg  sq daily for VTE prophylaxis CTAngio 5/12 negative for PE  Today, 09/12/2023 HL 0.36 therapeutic on 1300 units/hr Hgb 9.2, plts 220 No complications of therapy  noted   Goal of Therapy:  Heparin  level 0.3-0.7 units/ml Monitor platelets by anticoagulation protocol: Yes   Plan:  Continue heparin  drip at 1300 units/hr Daily heparin  level & CBC Monitor for signs & symptoms of bleeding   Thank you for allowing pharmacy to be a part of this patient's care.  Beau Bound RPh 09/12/2023, 4:49 AM

## 2023-09-12 NOTE — Progress Notes (Signed)
 PROGRESS NOTE    Sara Barnes  ZOX:[id number] DOB: 05-06-1940 DOA: 09/06/2023 PCP: Estill Hemming, DO     Brief Narrative:  Sara Barnes is an 83yo with h/o Crohn's s/p ileostomy, metastatic breast CA, afib not on AC due to hepatic hematoma, anxiety/depression, and recent hip repair currently in rehab who presented on 5/11 with SOB. While in the ER, she experienced a contrast reaction, as well. She is on BIPAP and was given steroids, benadryl , H2 blocker. She was diagnosed with sepsis due to viral PNA and afib with RVR.  Family resistant to palliative care evaluations and have refused to have further conversations.  Patient is slow to recover.  Patient is willing to consider going to a skilled nursing facility once medically ready.  New events last 24 hours / Subjective: Patient states that she is feeling well at rest.  Motivated to work with PT to see how her breathing does with ambulation today.  She is willing to go to SNF if necessary, but does not want to go back to [facility].  She lives at home alone.  Assessment & Plan:   Principal Problem:   Respiratory distress Active Problems:   Allergic reaction to contrast   History of Crohn's disease   Cancer of lower-inner quadrant of left female breast (HCC)   Sepsis due to pneumonia (HCC)   Hyponatremia   Breast cancer (HCC)   Acute metabolic encephalopathy   History of hip fracture   Atrial fibrillation with RVR (HCC)   Pneumonia of right upper lobe due to infectious organism   Paroxysmal A-fib (HCC)   Human metapneumovirus (hMPV) pneumonia   Malignant neoplasm of breast, stage 4 (HCC)   Atrial fibrillation with controlled ventricular rate (HCC)   Acute hypoxemic respiratory failure secondary to pneumonia - Remains on 6 L nasal cannula O2, continue to wean as able  Sepsis secondary to viral pneumonia - Tested positive for metapneumovirus - Sepsis present on admission - Continues to improve slowly -  Solu-Medrol   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 Findings are worrisome for metastatic disease" - Follow-up with Dr. Maryalice Smaller outpatient  Paroxysmal A-fib with RVR - Now off Cardizem  drip and on oral Cardizem  - No hematoma seen on CT.  This was discussed with patient and sister today.  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 - Fluid restriction - Sodium improved  Acute metabolic encephalopathy - Resolved  Mood disorder, anxiety - Xanax , fluoxetine   Recent history of hip fracture - She will need PT OT  Crohn's disease - On daily prednisone  PTA    DVT prophylaxis:  SCDs Start: 09/07/23 0451 Place TED hose Start: 09/07/23 0451 apixaban (ELIQUIS) tablet 5 mg  Code Status: Full code Family Communication: Sister at bedside Disposition Plan: Home with home health versus SNF pending PT evaluation Status is: Inpatient Remains inpatient appropriate because: She needs physical therapy    Antimicrobials:  Anti-infectives (From admission, onward)    Start     Dose/Rate Route Frequency Ordered Stop   09/08/23 0800  Vancomycin  (VANCOCIN ) 1,250 mg in sodium chloride  0.9 % 250 mL IVPB  Status:  Discontinued        1,250 mg 166.7 mL/hr over 90 Minutes Intravenous Every 24 hours 09/07/23 0501 09/08/23 0920   09/07/23 0500  ceFEPIme  (MAXIPIME ) 2 g in sodium chloride  0.9 % 100 mL  IVPB  Status:  Discontinued        2 g 200 mL/hr over 30 Minutes Intravenous Every 12 hours 09/07/23 0452 09/09/23 1152   09/07/23 0500  vancomycin  (VANCOREADY) IVPB 1500 mg/300 mL        1,500 mg 150 mL/hr over 120 Minutes Intravenous  Once 09/07/23 0452 09/07/23 1040   09/06/23 2300  cefTRIAXone  (ROCEPHIN ) 1 g in sodium chloride  0.9 % 100 mL IVPB        1 g 200 mL/hr over 30 Minutes Intravenous  Once 09/06/23 2246 09/06/23 2344   09/06/23 2300  azithromycin  (ZITHROMAX ) 500 mg  in sodium chloride  0.9 % 250 mL IVPB        500 mg 250 mL/hr over 60 Minutes Intravenous  Once 09/06/23 2246 09/07/23 0044        Objective: Vitals:   09/11/23 1949 09/12/23 0230 09/12/23 0739 09/12/23 0753  BP: 116/65 117/66  124/72  Pulse: 78 71  69  Resp:    16  Temp: 97.8 F (36.6 C)   97.6 F (36.4 C)  TempSrc: Oral   Axillary  SpO2: 95% 99% 100% 100%  Weight:      Height:        Intake/Output Summary (Last 24 hours) at 09/12/2023 1313 Last data filed at 09/12/2023 0900 Gross per 24 hour  Intake 1020 ml  Output 850 ml  Net 170 ml   Filed Weights   09/06/23 2227  Weight: 78.9 kg    Examination:  General exam: Appears calm and comfortable  Respiratory system: Diminished breath sounds without distress.  On 6 L oxygen Cardiovascular system: S1 & S2 heard. No murmurs. No pedal edema. Gastrointestinal system: Abdomen is nondistended, soft and nontender. Normal bowel sounds heard. Central nervous system: Alert and oriented. No focal neurological deficits. Speech clear.  Extremities: Symmetric in appearance  Skin: No rashes, lesions or ulcers on exposed skin  Psychiatry: Judgement and insight appear normal. Mood & affect appropriate.   Data Reviewed: I have personally reviewed following labs and imaging studies  CBC: Recent Labs  Lab 09/06/23 2239 09/07/23 0640 09/08/23 0522 09/09/23 0543 09/10/23 0454 09/11/23 0345 09/12/23 0410  WBC 6.0   < > 10.8* 15.5* 13.6* 13.1* 14.0*  NEUTROABS 3.7  --   --   --   --   --   --   HGB 11.0*   < > 9.8* 10.7* 10.0* 9.6* 9.2*  HCT 34.2*   < > 30.4* 33.5* 30.9* 29.7* 28.9*  MCV 96.6   < > 96.8 95.4 95.1 94.3 95.1  PLT 278   < > 287 266 225 199 220   < > = values in this interval not displayed.   Basic Metabolic Panel: Recent Labs  Lab 09/07/23 0532 09/07/23 0640 09/08/23 0810 09/09/23 0543 09/10/23 0454 09/11/23 0345 09/11/23 1137 09/11/23 1659 09/11/23 2254 09/12/23 0410  NA  --    < > 126* 128* 125* 122*  124* 121* 122* 127*  127*  K  --    < > 4.4 3.5 3.6 3.9  --   --   --  4.3  CL  --    < > 97* 92* 92* 89*  --   --   --  93*  CO2  --    < > 22 25 25 24   --   --   --  27  GLUCOSE  --    < > 126* 138* 114* 133*  --   --   --  131*  BUN  --    < > 15 26* 22 21  --   --   --  29*  CREATININE  --    < > 0.74 0.90 0.62 0.79  --   --   --  0.89  CALCIUM   --    < > 8.2* 8.5* 8.1* 7.9*  --   --   --  8.4*  MG 1.6*  --   --  2.2  --   --   --   --   --   --    < > = values in this interval not displayed.   GFR: Estimated Creatinine Clearance: 52.5 mL/min (by C-G formula based on SCr of 0.89 mg/dL). Liver Function Tests: Recent Labs  Lab 09/06/23 2239 09/07/23 0640 09/08/23 1613  AST 16 24  --   ALT 11 14  --   ALKPHOS 142* 122  --   BILITOT 0.6 0.6  --   PROT 6.5 6.3* 6.9  ALBUMIN  3.4* 3.1*  --    No results for input(s): "LIPASE", "AMYLASE" in the last 168 hours. No results for input(s): "AMMONIA" in the last 168 hours. Coagulation Profile: Recent Labs  Lab 09/06/23 2239  INR 1.0   Cardiac Enzymes: No results for input(s): "CKTOTAL", "CKMB", "CKMBINDEX", "TROPONINI" in the last 168 hours. BNP (last 3 results) No results for input(s): "PROBNP" in the last 8760 hours. HbA1C: No results for input(s): "HGBA1C" in the last 72 hours. CBG: Recent Labs  Lab 09/11/23 0547 09/11/23 1157 09/11/23 1630 09/12/23 0104 09/12/23 0549  GLUCAP 143* 145* 149* 141* 129*   Lipid Profile: No results for input(s): "CHOL", "HDL", "LDLCALC", "TRIG", "CHOLHDL", "LDLDIRECT" in the last 72 hours. Thyroid  Function Tests: No results for input(s): "TSH", "T4TOTAL", "FREET4", "T3FREE", "THYROIDAB" in the last 72 hours. Anemia Panel: No results for input(s): "VITAMINB12", "FOLATE", "FERRITIN", "TIBC", "IRON", "RETICCTPCT" in the last 72 hours. Sepsis Labs: Recent Labs  Lab 09/06/23 2239 09/07/23 0407 09/07/23 0930 09/07/23 1723 09/11/23 0345 09/12/23 0410  PROCALCITON  --   --   --   --   0.12 <0.10  LATICACIDVEN 2.1* 4.1* 2.8* 0.8  --   --     Recent Results (from the past 240 hours)  Blood Culture (routine x 2)     Status: None   Collection Time: 09/06/23 10:38 PM   Specimen: BLOOD  Result Value Ref Range Status   Specimen Description   Final    BLOOD SITE NOT SPECIFIED Performed at [facility], [address], [place], [place] [address]    Special Requests   Final    BOTTLES DRAWN AEROBIC AND ANAEROBIC Blood Culture results may not be optimal due to an inadequate volume of blood received in culture bottles Performed at [facility], [address], [place], [place] [address]    Culture   Final    NO GROWTH 5 DAYS Performed at [facility], [address], [place], [place] [address]    Report Status 09/12/2023 FINAL  Final  Resp panel by RT-PCR (RSV, Flu A&B, Covid) Anterior Nasal Swab     Status: None   Collection Time: 09/06/23 11:16 PM   Specimen: Anterior Nasal Swab  Result Value Ref Range Status   SARS Coronavirus 2 by RT PCR NEGATIVE NEGATIVE Final    Comment: (NOTE) SARS-CoV-2 target nucleic acids are NOT DETECTED.  The SARS-CoV-2 RNA is generally detectable in upper respiratory specimens during the acute  phase of infection. The lowest concentration of SARS-CoV-2 viral copies this assay can detect is 138 copies/mL. A negative result does not preclude SARS-Cov-2 infection and should not be used as the sole basis for treatment or other patient management decisions. A negative result may occur with  improper specimen collection/handling, submission of specimen other than nasopharyngeal swab, presence of viral mutation(s) within the areas targeted by this assay, and inadequate number of viral copies(<138 copies/mL). A negative result must be combined with clinical observations, patient history, and epidemiological information. The expected result is Negative.  Fact Sheet for Patients:   BloggerCourse.com  Fact Sheet for Healthcare Providers:  SeriousBroker.it  This test is no t yet approved or cleared by the [place]  FDA and  has been authorized for detection and/or diagnosis of SARS-CoV-2 by FDA under an Emergency Use Authorization (EUA). This EUA will remain  in effect (meaning this test can be used) for the duration of the COVID-19 declaration under Section 564(b)(1) of the Act, 21 U.S.C.section 360bbb-3(b)(1), unless the authorization is terminated  or revoked sooner.       Influenza A by PCR NEGATIVE NEGATIVE Final   Influenza B by PCR NEGATIVE NEGATIVE Final    Comment: (NOTE) The Xpert Xpress SARS-CoV-2/FLU/RSV plus assay is intended as an aid in the diagnosis of influenza from Nasopharyngeal swab specimens and should not be used as a sole basis for treatment. Nasal washings and aspirates are unacceptable for Xpert Xpress SARS-CoV-2/FLU/RSV testing.  Fact Sheet for Patients: BloggerCourse.com  Fact Sheet for Healthcare Providers: SeriousBroker.it  This test is not yet approved or cleared by the [place]  FDA and has been authorized for detection and/or diagnosis of SARS-CoV-2 by FDA under an Emergency Use Authorization (EUA). This EUA will remain in effect (meaning this test can be used) for the duration of the COVID-19 declaration under Section 564(b)(1) of the Act, 21 U.S.C. section 360bbb-3(b)(1), unless the authorization is terminated or revoked.     Resp Syncytial Virus by PCR NEGATIVE NEGATIVE Final    Comment: (NOTE) Fact Sheet for Patients: BloggerCourse.com  Fact Sheet for Healthcare Providers: SeriousBroker.it  This test is not yet approved or cleared by the [place]  FDA and has been authorized for detection and/or diagnosis of SARS-CoV-2 by FDA under an Emergency Use  Authorization (EUA). This EUA will remain in effect (meaning this test can be used) for the duration of the COVID-19 declaration under Section 564(b)(1) of the Act, 21 U.S.C. section 360bbb-3(b)(1), unless the authorization is terminated or revoked.  Performed at [facility], [address], [place], [place] [address]   Blood Culture (routine x 2)     Status: None   Collection Time: 09/06/23 11:27 PM   Specimen: BLOOD RIGHT ARM  Result Value Ref Range Status   Specimen Description   Final    BLOOD RIGHT ARM Performed at [facility], [address] [place], [place] [address]    Special Requests   Final    BOTTLES DRAWN AEROBIC ONLY Blood Culture adequate volume Performed at [facility], [address] [place], [place] [address]    Culture   Final    NO GROWTH 5 DAYS Performed at [facility], [address] [place], [place] [address]    Report Status 09/12/2023 FINAL  Final  MRSA Next Gen by PCR, Nasal     Status: None   Collection Time: 09/07/23 12:30 PM   Specimen: Nasal Mucosa; Nasal Swab  Result  Value Ref Range Status   MRSA by PCR Next Gen NOT DETECTED NOT DETECTED Final    Comment: (NOTE) The GeneXpert MRSA Assay (FDA approved for NASAL specimens only), is one component of a comprehensive MRSA colonization surveillance program. It is not intended to diagnose MRSA infection nor to guide or monitor treatment for MRSA infections. Test performance is not FDA approved in patients less than 77 years old. Performed at [facility], [address] [place] [address], [place], [place] [address]   Respiratory (~20 pathogens) panel by PCR     Status: Abnormal   Collection Time: 09/08/23  3:49 PM   Specimen: Nasopharyngeal Swab; Respiratory  Result Value Ref Range Status   Adenovirus NOT DETECTED NOT DETECTED Final   Coronavirus 229E NOT DETECTED NOT DETECTED Final    Comment: (NOTE) The Coronavirus on the Respiratory  Panel, DOES NOT test for the novel  Coronavirus (2019 nCoV)    Coronavirus HKU1 NOT DETECTED NOT DETECTED Final   Coronavirus NL63 NOT DETECTED NOT DETECTED Final   Coronavirus OC43 NOT DETECTED NOT DETECTED Final   Metapneumovirus DETECTED (A) NOT DETECTED Final   Rhinovirus / Enterovirus NOT DETECTED NOT DETECTED Final   Influenza A NOT DETECTED NOT DETECTED Final   Influenza B NOT DETECTED NOT DETECTED Final   Parainfluenza Virus 1 NOT DETECTED NOT DETECTED Final   Parainfluenza Virus 2 NOT DETECTED NOT DETECTED Final   Parainfluenza Virus 3 NOT DETECTED NOT DETECTED Final   Parainfluenza Virus 4 NOT DETECTED NOT DETECTED Final   Respiratory Syncytial Virus NOT DETECTED NOT DETECTED Final   Bordetella pertussis NOT DETECTED NOT DETECTED Final   Bordetella Parapertussis NOT DETECTED NOT DETECTED Final   Chlamydophila pneumoniae NOT DETECTED NOT DETECTED Final   Mycoplasma pneumoniae NOT DETECTED NOT DETECTED Final    Comment: Performed at [facility], [address], [place], [place] [address]  Body fluid culture w Gram Stain     Status: None   Collection Time: 09/08/23  4:00 PM   Specimen: Thoracentesis; Body Fluid  Result Value Ref Range Status   Specimen Description   Final    THORACENTESIS Performed at [facility], [address] [place], [place] [address]    Special Requests   Final    NONE Performed at [facility], [address], [place], [place] [address]    Gram Stain NO WBC SEEN NO ORGANISMS SEEN   Final   Culture   Final    NO GROWTH 3 DAYS Performed at [facility], [address], [place], [place] [address]    Report Status 09/12/2023 FINAL  Final      Radiology Studies: CT ABDOMEN PELVIS WO CONTRAST Result Date: 09/11/2023 CLINICAL DATA:  Evaluate for liver hematoma. EXAM: CT ABDOMEN AND PELVIS WITHOUT CONTRAST TECHNIQUE: Multidetector CT imaging of the abdomen and pelvis was performed following the  standard protocol without IV contrast. RADIATION DOSE REDUCTION: This exam was performed according to the departmental dose-optimization program which includes automated exposure control, adjustment of the mA and/or kV according to patient size and/or use of iterative reconstruction technique. COMPARISON:  PET CT 05/11/2023. CT chest abdomen and pelvis 01/23/2023. FINDINGS: Lower chest: Small bilateral pleural effusions have increased. There is new patchy airspace disease in the left lower lobe. There are new ground-glass opacities in the right middle lobe and right lower lobe. Bilateral breast implants are again seen. Hepatobiliary: There is a new rounded 16 mm hypodense lesion in the  right lobe of the liver image 2/87. No subcapsular fluid collection. Gallbladder and bile ducts are within normal limits. Pancreas: Unremarkable. No pancreatic ductal dilatation or surrounding inflammatory changes. Spleen: Normal in size without focal abnormality. Adrenals/Urinary Tract: Adrenal glands are unremarkable. Kidneys are normal, without renal calculi, focal lesion, or hydronephrosis. Bladder is unremarkable. Stomach/Bowel: Stomach is within normal limits. No evidence of bowel wall thickening, distention, or inflammatory changes. Small hiatal hernia is present. Colonic diverticula are present. The appendix is not seen. Vascular/Lymphatic: Aortic atherosclerosis. No enlarged abdominal or pelvic lymph nodes. Reproductive: Status post hysterectomy. No adnexal masses. Other: There is diffuse body wall edema. There is no ascites or focal abdominal wall hernia. Musculoskeletal: Left-sided hip screw and intramedullary nail are present. The bones are diffusely osteopenic. Heterogeneous lytic and sclerotic lesions in L4 and T10 again seen, not significantly changed. IMPRESSION: 1. New 16 mm hypodense lesion in the right lobe of the liver. Findings are worrisome for metastatic disease. Differential diagnosis would include infection.  No evidence for hematoma. 2. Stable osseous metastatic disease. 3. Increased small bilateral pleural effusions. 4. New patchy airspace disease in the left lower lobe and ground-glass opacities in the right middle lobe and right lower lobe. Findings may be infectious/inflammatory. 5. Diffuse body wall edema. 6. Colonic diverticulosis. 7. Aortic atherosclerosis. Aortic Atherosclerosis (ICD10-I70.0). Electronically Signed   By: Tyron Gallon M.D.   On: 09/11/2023 19:30      Scheduled Meds:  apixaban  5 mg Oral BID   arformoterol   15 mcg Nebulization BID   Chlorhexidine  Gluconate Cloth  6 each Topical Daily   vitamin D3  1,000 Units Oral Daily   diltiazem   240 mg Oral Daily   FLUoxetine   20 mg Oral Daily   fluticasone   2 spray Each Nare Daily   guaiFENesin   600 mg Oral BID   methylPREDNISolone  (SOLU-MEDROL ) injection  40 mg Intravenous Daily   nystatin   5 mL Oral QID   revefenacin   175 mcg Nebulization Daily   sodium chloride  flush  10-40 mL Intracatheter Q12H   sodium chloride  flush  3 mL Intravenous Q12H   Continuous Infusions:   LOS: 5 days   Time spent: 40 minutes   Daren Eck, DO [facility] 09/12/2023, 1:13 PM   Available via Epic secure chat 7am-7pm After these hours, please refer to coverage provider listed on [email or web address]

## 2023-09-13 DIAGNOSIS — R0603 Acute respiratory distress: Secondary | ICD-10-CM | POA: Diagnosis not present

## 2023-09-13 LAB — BASIC METABOLIC PANEL WITH GFR
Anion gap: 12 (ref 5–15)
BUN: 28 mg/dL — ABNORMAL HIGH (ref 8–23)
CO2: 23 mmol/L (ref 22–32)
Calcium: 8.3 mg/dL — ABNORMAL LOW (ref 8.9–10.3)
Chloride: 92 mmol/L — ABNORMAL LOW (ref 98–111)
Creatinine, Ser: 0.74 mg/dL (ref 0.44–1.00)
GFR, Estimated: >60 mL/min (ref >60)
Glucose, Bld: 198 mg/dL — ABNORMAL HIGH (ref 70–99)
Potassium: 4 mmol/L (ref 3.5–5.1)
Sodium: 127 mmol/L — ABNORMAL LOW (ref 135–145)

## 2023-09-13 LAB — LEGIONELLA PNEUMOPHILA SEROGP 1 UR AG: L. pneumophila Serogp 1 Ur Ag: NEGATIVE

## 2023-09-13 LAB — CBC
HCT: 29 % — ABNORMAL LOW (ref 36.0–46.0)
Hemoglobin: 9.4 g/dL — ABNORMAL LOW (ref 12.0–15.0)
MCH: 30.5 pg (ref 26.0–34.0)
MCHC: 32.4 g/dL (ref 30.0–36.0)
MCV: 94.2 fL (ref 80.0–100.0)
Platelets: 239 10*3/uL (ref 150–400)
RBC: 3.08 MIL/uL — ABNORMAL LOW (ref 3.87–5.11)
RDW: 12 % (ref 11.5–15.5)
WBC: 12.3 10*3/uL — ABNORMAL HIGH (ref 4.0–10.5)
nRBC: 0 % (ref 0.0–0.2)

## 2023-09-13 LAB — GLUCOSE, CAPILLARY
Glucose-Capillary: 141 mg/dL — ABNORMAL HIGH (ref 70–99)
Glucose-Capillary: 147 mg/dL — ABNORMAL HIGH (ref 70–99)
Glucose-Capillary: 178 mg/dL — ABNORMAL HIGH (ref 70–99)

## 2023-09-13 LAB — T4, FREE: Free T4: 0.93 ng/dL (ref 0.61–1.12)

## 2023-09-13 LAB — TSH: TSH: 0.129 u[IU]/mL — ABNORMAL LOW (ref 0.350–4.500)

## 2023-09-13 MED ORDER — SODIUM CHLORIDE 1 G PO TABS
1.0000 g | ORAL_TABLET | Freq: Two times a day (BID) | ORAL | Status: DC
  Administered 2023-09-13 (×2): 1 g via ORAL
  Filled 2023-09-13 (×2): qty 1

## 2023-09-13 MED ORDER — PREDNISONE 5 MG PO TABS
5.0000 mg | ORAL_TABLET | Freq: Every day | ORAL | Status: DC
  Administered 2023-09-14 – 2023-09-15 (×2): 5 mg via ORAL
  Filled 2023-09-13 (×2): qty 1

## 2023-09-13 MED ORDER — METHYLPREDNISOLONE SODIUM SUCC 40 MG IJ SOLR
40.0000 mg | Freq: Every day | INTRAMUSCULAR | Status: AC
  Administered 2023-09-13: 40 mg via INTRAVENOUS
  Filled 2023-09-13: qty 1

## 2023-09-13 NOTE — TOC Progression Note (Signed)
 Transition of Care [facility]) - Progression Note    Patient Details  Name: Sara Barnes MRN: [id number] Date of Birth: Sep 18, 1940  Transition of Care [facility]) CM/SW Contact  Jonni Nettle, LCSW Phone Number: 09/13/2023, 12:07 PM  Clinical Narrative:    CSW met with pt and sister, Dal Dubin [phone], at bedside to discuss PT's recommendation for SNF rehabilition. Pt is hard-of-hearing and requested CSW to discuss this with pt's sister. Pt's sister reports pt has had bad experiences at [facility] last month and [facility] years ago. Pt sister reports apprehension about SNF placement. Pt's sister reports they would prefer to discharge home with [facility] services opposed to SNF. Pt's sister reports pt lives with sister, and has private duty CNA helping pt at home. Pt's sister also reports she would like to see what SNF beds are available, prior to making final decision. CSW completed FL2 and sent referrals to SNF in [place] and surrounding area. TOC will follow up with pt and sister tomorrow regarding bed offers. TOC will continue to follow.   Expected Discharge Plan: Skilled Nursing Facility (vs. home with [facility]) Barriers to Discharge: Continued Medical Work up  Expected Discharge Plan and Services In-house Referral: Clinical Social Work     Living arrangements for the past 2 months: Single Family Home (*at [facility] for ~ 2 weeks just prior to this admission for purpose of rehab)                   Social Determinants of Health (SDOH) Interventions SDOH Screenings   Food Insecurity: No Food Insecurity (09/08/2023)  Housing: Low Risk  (09/08/2023)  Transportation Needs: No Transportation Needs (09/08/2023)  Utilities: Not At Risk (09/08/2023)  Alcohol Screen: Low Risk  (03/26/2021)  Depression (PHQ2-9): Low Risk  (03/28/2022)  Financial Resource Strain: Low Risk  (03/26/2021)  Physical Activity: Sufficiently Active (03/26/2021)  Social Connections: Moderately Isolated (09/08/2023)   Stress: No Stress Concern Present (03/26/2021)  Tobacco Use: Low Risk  (09/07/2023)    Readmission Risk Interventions    09/08/2023    3:24 PM  Readmission Risk Prevention Plan  Transportation Screening Complete  PCP or Specialist Appt within 3-5 Days Complete  HRI or Home Care Consult Complete  Social Work Consult for Recovery Care Planning/Counseling Complete  Medication Review Oceanographer) Complete

## 2023-09-13 NOTE — Progress Notes (Signed)
 PROGRESS NOTE    Sara Barnes  WUJ:[id number] DOB: Aug 09, 1940 DOA: 09/06/2023 PCP: Estill Hemming, DO     Brief Narrative:  Sara Barnes is an 83yo with h/o Crohn's s/p ileostomy, metastatic breast CA, afib not on AC due to hepatic hematoma, anxiety/depression, and recent hip repair currently in rehab who presented on 5/11 with SOB. While in the ER, she experienced a contrast reaction, as well. She is on BIPAP and was given steroids, benadryl , H2 blocker. She was diagnosed with sepsis due to viral PNA and afib with RVR.  Family resistant to palliative care evaluations and have refused to have further conversations.  Patient is slow to recover.  Patient is willing to consider going to a skilled nursing facility once medically ready.  New events last 24 hours / Subjective: Feeling better today.  She is quite unsure if she wants to return to SNF.  She will have her sister as well as a friend who is a CNA to help when she returns home.  They are realistic that she may require SNF on discharge, will continue conversation  Assessment & Plan:   Principal Problem:   Respiratory distress Active Problems:   Allergic reaction to contrast   History of Crohn's disease   Cancer of lower-inner quadrant of left female breast (HCC)   Sepsis due to pneumonia (HCC)   Hyponatremia   Breast cancer (HCC)   Acute metabolic encephalopathy   History of hip fracture   Atrial fibrillation with RVR (HCC)   Pneumonia of right upper lobe due to infectious organism   Paroxysmal A-fib (HCC)   Human metapneumovirus (hMPV) pneumonia   Malignant neoplasm of breast, stage 4 (HCC)   Atrial fibrillation with controlled ventricular rate (HCC)   Acute hypoxemic respiratory failure secondary to pneumonia - Remains on 5 L nasal cannula O2, continue to wean as able  Sepsis secondary to viral pneumonia - Tested positive for metapneumovirus - Sepsis present on admission - Continues to improve slowly - Complete  Solu-Medrol   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 Findings are worrisome for metastatic disease" - Follow-up with Dr. Maryalice Smaller outpatient  Paroxysmal A-fib with RVR - Now off Cardizem  drip and on oral Cardizem  - No hematoma seen on CT.  This was discussed with patient and sister.  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 - Fluid restriction - Start salt tabs  Acute metabolic encephalopathy - Resolved  Mood disorder, anxiety - Xanax , fluoxetine   Recent history of hip fracture - PT OT recommending SNF placement  Crohn's disease - Resume home dose of daily prednisone  tomorrow    DVT prophylaxis:  SCDs Start: 09/07/23 0451 Place TED hose Start: 09/07/23 0451 apixaban (ELIQUIS) tablet 5 mg  Code Status: Full code Family Communication: Sister at bedside Disposition Plan: Home with home health versus SNF  Status is: Inpatient Remains inpatient appropriate because: She needs physical therapy, continue to wean down on oxygen    Antimicrobials:  Anti-infectives (From admission, onward)    Start     Dose/Rate Route Frequency Ordered Stop   09/08/23 0800  Vancomycin  (VANCOCIN ) 1,250 mg in sodium chloride  0.9 % 250 mL IVPB  Status:  Discontinued        1,250 mg 166.7 mL/hr over 90 Minutes Intravenous Every 24 hours 09/07/23 0501 09/08/23 0920   09/07/23 0500  ceFEPIme  (MAXIPIME ) 2 g in sodium chloride   0.9 % 100 mL IVPB  Status:  Discontinued        2 g 200 mL/hr over 30 Minutes Intravenous Every 12 hours 09/07/23 0452 09/09/23 1152   09/07/23 0500  vancomycin  (VANCOREADY) IVPB 1500 mg/300 mL        1,500 mg 150 mL/hr over 120 Minutes Intravenous  Once 09/07/23 0452 09/07/23 1040   09/06/23 2300  cefTRIAXone  (ROCEPHIN ) 1 g in sodium chloride  0.9 % 100 mL IVPB        1 g 200 mL/hr over 30 Minutes Intravenous  Once 09/06/23 2246 09/06/23 2344   09/06/23 2300  azithromycin   (ZITHROMAX ) 500 mg in sodium chloride  0.9 % 250 mL IVPB        500 mg 250 mL/hr over 60 Minutes Intravenous  Once 09/06/23 2246 09/07/23 0044        Objective: Vitals:   09/13/23 0540 09/13/23 0754 09/13/23 0757 09/13/23 1133  BP: 124/73   130/73  Pulse: 90   89  Resp: 20   18  Temp: 97.6 F (36.4 C)   98.5 F (36.9 C)  TempSrc: Oral   Oral  SpO2: 94% 96% 98% 96%  Weight:      Height:        Intake/Output Summary (Last 24 hours) at 09/13/2023 1237 Last data filed at 09/13/2023 0900 Gross per 24 hour  Intake 1260 ml  Output 850 ml  Net 410 ml   Filed Weights   09/06/23 2227  Weight: 78.9 kg    Examination:  General exam: Appears calm and comfortable, without respiratory distress or conversational dyspnea Central nervous system: Alert and oriented.  Extremities: Symmetric in appearance  Psychiatry: Judgement and insight appear normal. Mood & affect appropriate.   Data Reviewed: I have personally reviewed following labs and imaging studies  CBC: Recent Labs  Lab 09/06/23 2239 09/07/23 0640 09/09/23 0543 09/10/23 0454 09/11/23 0345 09/12/23 0410 09/13/23 0500  WBC 6.0   < > 15.5* 13.6* 13.1* 14.0* 12.3*  NEUTROABS 3.7  --   --   --   --   --   --   HGB 11.0*   < > 10.7* 10.0* 9.6* 9.2* 9.4*  HCT 34.2*   < > 33.5* 30.9* 29.7* 28.9* 29.0*  MCV 96.6   < > 95.4 95.1 94.3 95.1 94.2  PLT 278   < > 266 225 199 220 239   < > = values in this interval not displayed.   Basic Metabolic Panel: Recent Labs  Lab 09/07/23 0532 09/07/23 0640 09/09/23 0543 09/10/23 0454 09/11/23 0345 09/11/23 1137 09/11/23 1659 09/11/23 2254 09/12/23 0410 09/13/23 0500  NA  --    < > 128* 125* 122* 124* 121* 122* 127*  127* 127*  K  --    < > 3.5 3.6 3.9  --   --   --  4.3 4.0  CL  --    < > 92* 92* 89*  --   --   --  93* 92*  CO2  --    < > 25 25 24   --   --   --  27 23  GLUCOSE  --    < > 138* 114* 133*  --   --   --  131* 198*  BUN  --    < > 26* 22 21  --   --   --  29*  28*  CREATININE  --    < > 0.90 0.62 0.79  --   --   --  0.89 0.74  CALCIUM   --    < > 8.5* 8.1* 7.9*  --   --   --  8.4* 8.3*  MG 1.6*  --  2.2  --   --   --   --   --   --   --    < > = values in this interval not displayed.   GFR: Estimated Creatinine Clearance: 58.4 mL/min (by C-G formula based on SCr of 0.74 mg/dL). Liver Function Tests: Recent Labs  Lab 09/06/23 2239 09/07/23 0640 09/08/23 1613  AST 16 24  --   ALT 11 14  --   ALKPHOS 142* 122  --   BILITOT 0.6 0.6  --   PROT 6.5 6.3* 6.9  ALBUMIN  3.4* 3.1*  --    No results for input(s): "LIPASE", "AMYLASE" in the last 168 hours. No results for input(s): "AMMONIA" in the last 168 hours. Coagulation Profile: Recent Labs  Lab 09/06/23 2239  INR 1.0   Cardiac Enzymes: No results for input(s): "CKTOTAL", "CKMB", "CKMBINDEX", "TROPONINI" in the last 168 hours. BNP (last 3 results) No results for input(s): "PROBNP" in the last 8760 hours. HbA1C: No results for input(s): "HGBA1C" in the last 72 hours. CBG: Recent Labs  Lab 09/12/23 0549 09/12/23 1618 09/12/23 2359 09/13/23 0600 09/13/23 1131  GLUCAP 129* 167* 141* 178* 147*   Lipid Profile: No results for input(s): "CHOL", "HDL", "LDLCALC", "TRIG", "CHOLHDL", "LDLDIRECT" in the last 72 hours. Thyroid  Function Tests: Recent Labs    09/13/23 0500  TSH 0.129*   Anemia Panel: No results for input(s): "VITAMINB12", "FOLATE", "FERRITIN", "TIBC", "IRON", "RETICCTPCT" in the last 72 hours. Sepsis Labs: Recent Labs  Lab 09/06/23 2239 09/07/23 0407 09/07/23 0930 09/07/23 1723 09/11/23 0345 09/12/23 0410  PROCALCITON  --   --   --   --  0.12 <0.10  LATICACIDVEN 2.1* 4.1* 2.8* 0.8  --   --     Recent Results (from the past 240 hours)  Blood Culture (routine x 2)     Status: None   Collection Time: 09/06/23 10:38 PM   Specimen: BLOOD  Result Value Ref Range Status   Specimen Description   Final    BLOOD SITE NOT SPECIFIED Performed at [facility], [address], [place], [place] [address]    Special Requests   Final    BOTTLES DRAWN AEROBIC AND ANAEROBIC Blood Culture results may not be optimal due to an inadequate volume of blood received in culture bottles Performed at [facility], [address], [place], [place] [address]    Culture   Final    NO GROWTH 5 DAYS Performed at [facility], [address], [place], [place] [address]    Report Status 09/12/2023 FINAL  Final  Resp panel by RT-PCR (RSV, Flu A&B, Covid) Anterior Nasal Swab     Status: None   Collection Time: 09/06/23 11:16 PM   Specimen: Anterior Nasal Swab  Result Value Ref Range Status   SARS Coronavirus 2 by RT PCR NEGATIVE NEGATIVE Final    Comment: (NOTE) SARS-CoV-2 target nucleic acids are NOT DETECTED.  The SARS-CoV-2 RNA is generally detectable in upper respiratory specimens during the acute phase of infection. The lowest concentration of SARS-CoV-2 viral copies this assay can detect is 138 copies/mL. A negative result does not preclude SARS-Cov-2 infection and should not be used as the sole basis for treatment or other patient management decisions. A negative result may occur with  improper specimen collection/handling, submission of specimen other than nasopharyngeal swab, presence of viral mutation(s) within the areas targeted by this assay, and inadequate number of viral copies(<138 copies/mL). A negative result must be combined with clinical observations, patient history, and epidemiological information. The expected result is Negative.  Fact Sheet for Patients:  BloggerCourse.com  Fact Sheet for Healthcare Providers:  SeriousBroker.it  This test is no t yet approved or cleared by the [place]  FDA and  has been authorized for detection and/or diagnosis of SARS-CoV-2 by FDA under an Emergency Use Authorization (EUA). This EUA will remain  in  effect (meaning this test can be used) for the duration of the COVID-19 declaration under Section 564(b)(1) of the Act, 21 U.S.C.section 360bbb-3(b)(1), unless the authorization is terminated  or revoked sooner.       Influenza A by PCR NEGATIVE NEGATIVE Final   Influenza B by PCR NEGATIVE NEGATIVE Final    Comment: (NOTE) The Xpert Xpress SARS-CoV-2/FLU/RSV plus assay is intended as an aid in the diagnosis of influenza from Nasopharyngeal swab specimens and should not be used as a sole basis for treatment. Nasal washings and aspirates are unacceptable for Xpert Xpress SARS-CoV-2/FLU/RSV testing.  Fact Sheet for Patients: BloggerCourse.com  Fact Sheet for Healthcare Providers: SeriousBroker.it  This test is not yet approved or cleared by the [place]  FDA and has been authorized for detection and/or diagnosis of SARS-CoV-2 by FDA under an Emergency Use Authorization (EUA). This EUA will remain in effect (meaning this test can be used) for the duration of the COVID-19 declaration under Section 564(b)(1) of the Act, 21 U.S.C. section 360bbb-3(b)(1), unless the authorization is terminated or revoked.     Resp Syncytial Virus by PCR NEGATIVE NEGATIVE Final    Comment: (NOTE) Fact Sheet for Patients: BloggerCourse.com  Fact Sheet for Healthcare Providers: SeriousBroker.it  This test is not yet approved or cleared by the [place]  FDA and has been authorized for detection and/or diagnosis of SARS-CoV-2 by FDA under an Emergency Use Authorization (EUA). This EUA will remain in effect (meaning this test can be used) for the duration of the COVID-19 declaration under Section 564(b)(1) of the Act, 21 U.S.C. section 360bbb-3(b)(1), unless the authorization is terminated or revoked.  Performed at [facility], [address], [place], [place] [address]    Blood Culture (routine x 2)     Status: None   Collection Time: 09/06/23 11:27 PM   Specimen: BLOOD RIGHT ARM  Result Value Ref Range Status   Specimen Description   Final    BLOOD RIGHT ARM Performed at [facility], [address] [place], [place] [address]    Special Requests   Final    BOTTLES DRAWN AEROBIC ONLY Blood Culture adequate volume Performed at [facility], [address] [place], [place] [address]    Culture   Final    NO GROWTH 5 DAYS Performed at [facility], [address] [place], [place] [address]    Report Status 09/12/2023 FINAL  Final  MRSA Next Gen by PCR, Nasal     Status: None   Collection Time: 09/07/23 12:30 PM   Specimen: Nasal Mucosa; Nasal Swab  Result Value Ref Range Status   MRSA by PCR Next Gen NOT DETECTED NOT DETECTED Final    Comment: (NOTE) The GeneXpert MRSA Assay (FDA approved for NASAL specimens only), is one component of a comprehensive MRSA colonization surveillance program. It is not intended to diagnose MRSA  infection nor to guide or monitor treatment for MRSA infections. Test performance is not FDA approved in patients less than 44 years old. Performed at [facility], [address] [place], [place] [address]   Respiratory (~20 pathogens) panel by PCR     Status: Abnormal   Collection Time: 09/08/23  3:49 PM   Specimen: Nasopharyngeal Swab; Respiratory  Result Value Ref Range Status   Adenovirus NOT DETECTED NOT DETECTED Final   Coronavirus 229E NOT DETECTED NOT DETECTED Final    Comment: (NOTE) The Coronavirus on the Respiratory Panel, DOES NOT test for the novel  Coronavirus (2019 nCoV)    Coronavirus HKU1 NOT DETECTED NOT DETECTED Final   Coronavirus NL63 NOT DETECTED NOT DETECTED Final   Coronavirus OC43 NOT DETECTED NOT DETECTED Final   Metapneumovirus DETECTED (A) NOT DETECTED Final   Rhinovirus / Enterovirus NOT DETECTED NOT DETECTED Final   Influenza A NOT  DETECTED NOT DETECTED Final   Influenza B NOT DETECTED NOT DETECTED Final   Parainfluenza Virus 1 NOT DETECTED NOT DETECTED Final   Parainfluenza Virus 2 NOT DETECTED NOT DETECTED Final   Parainfluenza Virus 3 NOT DETECTED NOT DETECTED Final   Parainfluenza Virus 4 NOT DETECTED NOT DETECTED Final   Respiratory Syncytial Virus NOT DETECTED NOT DETECTED Final   Bordetella pertussis NOT DETECTED NOT DETECTED Final   Bordetella Parapertussis NOT DETECTED NOT DETECTED Final   Chlamydophila pneumoniae NOT DETECTED NOT DETECTED Final   Mycoplasma pneumoniae NOT DETECTED NOT DETECTED Final    Comment: Performed at [facility], [address], [place], [place] [address]  Body fluid culture w Gram Stain     Status: None   Collection Time: 09/08/23  4:00 PM   Specimen: Thoracentesis; Body Fluid  Result Value Ref Range Status   Specimen Description   Final    THORACENTESIS Performed at [facility], [address] [place], [place] [address]    Special Requests   Final    NONE Performed at [facility], [address] [place], [place] [address]    Gram Stain NO WBC SEEN NO ORGANISMS SEEN   Final   Culture   Final    NO GROWTH 3 DAYS Performed at [facility], [address], [place], [place] [address]    Report Status 09/12/2023 FINAL  Final      Radiology Studies: CT ABDOMEN PELVIS WO CONTRAST Result Date: 09/11/2023 CLINICAL DATA:  Evaluate for liver hematoma. EXAM: CT ABDOMEN AND PELVIS WITHOUT CONTRAST TECHNIQUE: Multidetector CT imaging of the abdomen and pelvis was performed following the standard protocol without IV contrast. RADIATION DOSE REDUCTION: This exam was performed according to the departmental dose-optimization program which includes automated exposure control, adjustment of the mA and/or kV according to patient size and/or use of iterative reconstruction technique. COMPARISON:  PET CT 05/11/2023. CT chest abdomen and  pelvis 01/23/2023. FINDINGS: Lower chest: Small bilateral pleural effusions have increased. There is new patchy airspace disease in the left lower lobe. There are new ground-glass opacities in the right middle lobe and right lower lobe. Bilateral breast implants are again seen. Hepatobiliary: There is a new rounded 16 mm hypodense lesion in the right lobe of the liver image 2/87. No subcapsular fluid collection. Gallbladder and bile ducts are within normal limits. Pancreas: Unremarkable. No pancreatic ductal dilatation or surrounding inflammatory changes. Spleen: Normal in size without focal abnormality. Adrenals/Urinary Tract: Adrenal glands are unremarkable. Kidneys are normal, without renal calculi,  focal lesion, or hydronephrosis. Bladder is unremarkable. Stomach/Bowel: Stomach is within normal limits. No evidence of bowel wall thickening, distention, or inflammatory changes. Small hiatal hernia is present. Colonic diverticula are present. The appendix is not seen. Vascular/Lymphatic: Aortic atherosclerosis. No enlarged abdominal or pelvic lymph nodes. Reproductive: Status post hysterectomy. No adnexal masses. Other: There is diffuse body wall edema. There is no ascites or focal abdominal wall hernia. Musculoskeletal: Left-sided hip screw and intramedullary nail are present. The bones are diffusely osteopenic. Heterogeneous lytic and sclerotic lesions in L4 and T10 again seen, not significantly changed. IMPRESSION: 1. New 16 mm hypodense lesion in the right lobe of the liver. Findings are worrisome for metastatic disease. Differential diagnosis would include infection. No evidence for hematoma. 2. Stable osseous metastatic disease. 3. Increased small bilateral pleural effusions. 4. New patchy airspace disease in the left lower lobe and ground-glass opacities in the right middle lobe and right lower lobe. Findings may be infectious/inflammatory. 5. Diffuse body wall edema. 6. Colonic diverticulosis. 7. Aortic  atherosclerosis. Aortic Atherosclerosis (ICD10-I70.0). Electronically Signed   By: Tyron Gallon M.D.   On: 09/11/2023 19:30      Scheduled Meds:  apixaban  5 mg Oral BID   arformoterol   15 mcg Nebulization BID   Chlorhexidine  Gluconate Cloth  6 each Topical Daily   vitamin D3  1,000 Units Oral Daily   diltiazem   240 mg Oral Daily   FLUoxetine   20 mg Oral Daily   fluticasone   2 spray Each Nare Daily   guaiFENesin   600 mg Oral BID   nystatin   5 mL Oral QID   revefenacin   175 mcg Nebulization Daily   sodium chloride  flush  10-40 mL Intracatheter Q12H   sodium chloride  flush  3 mL Intravenous Q12H   sodium chloride   1 g Oral BID WC   Continuous Infusions:   LOS: 6 days   Time spent: 25 minutes   Sara Eck, DO [facility] 09/13/2023, 12:37 PM   Available via Epic secure chat 7am-7pm After these hours, please refer to coverage provider listed on [email or web address]

## 2023-09-13 NOTE — [place] FL2 (Signed)
 [place]  MEDICAID FL2 LEVEL OF CARE FORM     IDENTIFICATION  Patient Name: Sara Barnes Birthdate: [birth year] Sex: female Admission Date (Current Location): 09/06/2023  [facility] and [place] Number:  Producer, television/film/video and Address:  [facility],  [address] [place], [place] [address]      Provider Number: [id number]  Attending Physician Name and Address:  Daren Eck, DO  Relative Name and Phone Number:  Dal Dubin, sister, [phone]    Current Level of Care: Hospital Recommended Level of Care: Skilled Nursing Facility Prior Approval Number:    Date Approved/Denied:   PASRR Number: [id number]  Discharge Plan: SNF    Current Diagnoses: Patient Active Problem List   Diagnosis Date Noted   Atrial fibrillation with controlled ventricular rate (HCC) 09/10/2023   Human metapneumovirus (hMPV) pneumonia 09/09/2023   Malignant neoplasm of breast, stage 4 (HCC) 09/09/2023   Pneumonia of right upper lobe due to infectious organism 09/08/2023   Paroxysmal A-fib (HCC) 09/08/2023   Allergic reaction to contrast 09/07/2023   Acute metabolic encephalopathy 09/07/2023   Acute hypoxic respiratory failure (HCC) 09/07/2023   History of hip fracture 09/07/2023   CKD (chronic kidney disease), stage II 09/07/2023   Respiratory distress 09/07/2023   Acute respiratory failure with hypoxia (HCC) 09/07/2023   Atrial fibrillation with RVR (HCC) 09/07/2023   Closed left hip fracture (HCC) 08/14/2023   Anxiety about health 06/12/2022   Liver mass    Breast cancer (HCC)    Pleural effusion    Status post thoracentesis    Malnutrition of moderate degree 05/10/2021   Clostridium difficile colitis    Intractable vomiting with nausea 04/19/2021   Left facial pain 11/15/2020   Chronic pansinusitis 11/12/2020   Primary insomnia 10/17/2020   Osteopenia of neck of left femur 03/26/2020   Preventative health care 03/26/2020   Closed fracture of shaft of right fibula with  routine healing 02/02/2020   Hyponatremia 01/23/2020   Urinary frequency 01/23/2020   Dysphagia 01/23/2020   Acute pansinusitis 01/08/2018   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 06/02/2017 05/28/2017   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 05/21/2017   Right ear pain 05/19/2017   Dizziness 05/19/2017   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 03/24/2017   Chronic diastolic CHF (congestive heart failure) (HCC) 08/05/2016   Paroxysmal atrial fibrillation with RVR (HCC) 06/24/2016   Chest discomfort 06/21/2016   Nausea and vomiting    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   Small bowel obstruction (HCC)    Ileus (HCC)    Generalized anxiety disorder 02/14/2016   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   Hypokalemia    Fever    Crohn's disease of colon with complication (HCC)    Other depression due to general medical condition    Difficulty in walking, not elsewhere classified    Acute blood loss anemia    Absolute anemia    Left upper quadrant pain    Abdominal pain 01/07/2016   GI bleed 01/07/2016   IBS (irritable bowel syndrome)    Tenesmus    Ulcerative pancolitis with rectal bleeding (HCC)    Diarrhea 12/24/2015   Crohn disease (HCC) 12/24/2015   Dehydration    Ulcerative pancolitis with abscess (HCC)    Sepsis due to pneumonia (HCC) 04/19/2015   Rectal pain    Leucocytosis    Cancer of lower-inner quadrant of left female breast (HCC) 11/30/2014   Perirectal abscess 09/29/2013  History of Crohn's disease 08/15/2006   Vitamin B12 deficiency 08/12/2006   Anxiety and depression 08/12/2006    Orientation RESPIRATION BLADDER Height & Weight     Self, Time, Situation, Place  O2 (5L) Incontinent Weight: 174 lb (78.9 kg) Height:  5\' 6"  (167.6 cm)  BEHAVIORAL SYMPTOMS/MOOD NEUROLOGICAL BOWEL NUTRITION STATUS      Incontinent Diet (Regular)  AMBULATORY STATUS COMMUNICATION OF NEEDS Skin   Limited Assist Verbally  Normal                       Personal Care Assistance Level of Assistance  Bathing, Feeding, Dressing Bathing Assistance: Limited assistance Feeding assistance: Independent Dressing Assistance: Maximum assistance     Functional Limitations Info  Sight, Hearing, Speech Sight Info: Impaired (reading glasses) Hearing Info: Impaired (hearing aids) Speech Info: Adequate    SPECIAL CARE FACTORS FREQUENCY  PT (By licensed PT), OT (By licensed OT)     PT Frequency: 5x week OT Frequency: 5x week            Contractures Contractures Info: Not present    Additional Factors Info  Code Status, Allergies Code Status Info: FULL Allergies Info: Nsaids, Codeine, Iodinated Contrast Media, Mesalamine , Omnipaque  (Iohexol ), Oxycodone , Albuterol , Contrast Allergy Premed Pack (Prednisone  & Diphenhydramine ), Lactose Intolerance (Gi), Norco (Hydrocodone -acetaminophen ), Other, Sulfa Antibiotics           Current Medications (09/13/2023):  This is the current hospital active medication list Current Facility-Administered Medications  Medication Dose Route Frequency Provider Last Rate Last Admin   acetaminophen  (TYLENOL ) tablet 650 mg  650 mg Oral Q6H PRN Deneise Finlay D, NP   650 mg at 09/09/23 1610   ALPRAZolam  (XANAX ) tablet 0.25 mg  0.25 mg Oral BID PRN Lorita Rosa, MD   0.25 mg at 09/12/23 2116   apixaban (ELIQUIS) tablet 5 mg  5 mg Oral BID Daren Eck, DO   5 mg at 09/13/23 9604   arformoterol  (BROVANA ) nebulizer solution 15 mcg  15 mcg Nebulization BID Joesph Mussel, DO   15 mcg at 09/13/23 5409   Chlorhexidine  Gluconate Cloth 2 % PADS 6 each  6 each Topical Daily Lorita Rosa, MD   6 each at 09/13/23 0600   cholecalciferol  (VITAMIN D3) 25 MCG (1000 UNIT) tablet 1,000 Units  1,000 Units Oral Daily Lorita Rosa, MD   1,000 Units at 09/13/23 8119   diltiazem  (CARDIZEM  CD) 24 hr capsule 240 mg  240 mg Oral Daily Tolia, Sunit, DO   240 mg at 09/13/23 1478   famotidine   (PEPCID ) tablet 20 mg  20 mg Oral BID PRN Lorita Rosa, MD       FLUoxetine  (PROZAC ) capsule 20 mg  20 mg Oral Daily Lorita Rosa, MD   20 mg at 09/13/23 2956   fluticasone  (FLONASE ) 50 MCG/ACT nasal spray 2 spray  2 spray Each Nare Daily Lorita Rosa, MD   2 spray at 09/13/23 2130   guaiFENesin  (MUCINEX ) 12 hr tablet 600 mg  600 mg Oral BID Lorita Rosa, MD   600 mg at 09/13/23 8657   levalbuterol  (XOPENEX ) nebulizer solution 0.63 mg  0.63 mg Nebulization Q6H PRN Lorita Rosa, MD   0.63 mg at 09/13/23 8469   Muscle Rub CREA   Topical PRN Lorita Rosa, MD   Given at 09/08/23 0911   nystatin  (MYCOSTATIN ) 100000 UNIT/ML suspension 500,000 Units  5 mL Oral QID Vann, Jessica U, DO   500,000 Units at 09/13/23 6295   ondansetron  (ZOFRAN ) injection  4 mg  4 mg Intravenous Q6H PRN Vann, Jessica U, DO   4 mg at 09/13/23 0559   oxyCODONE  (Oxy IR/ROXICODONE ) immediate release tablet 10 mg  10 mg Oral Q6H PRN Deneise Finlay D, NP   10 mg at 09/13/23 0556   revefenacin  (YUPELRI ) nebulizer solution 175 mcg  175 mcg Nebulization Daily Joesph Mussel, DO   175 mcg at 09/13/23 0753   sodium chloride  flush (NS) 0.9 % injection 10-40 mL  10-40 mL Intracatheter Q12H Sonja [place], MD   10 mL at 09/13/23 1610   sodium chloride  flush (NS) 0.9 % injection 10-40 mL  10-40 mL Intracatheter PRN Sonja , MD       sodium chloride  flush (NS) 0.9 % injection 3 mL  3 mL Intravenous Q12H Sundil, Subrina, MD   3 mL at 09/13/23 0908   sodium chloride  tablet 1 g  1 g Oral BID WC Daren Eck, DO   1 g at 09/13/23 9604   zolpidem  (AMBIEN ) tablet 5 mg  5 mg Oral QHS PRN Lorita Rosa, MD   5 mg at 09/12/23 2115     Discharge Medications: Please see discharge summary for a list of discharge medications.  Relevant Imaging Results:  Relevant Lab Results:   Additional Information SSN: [id number]  Jonni Nettle, LCSW

## 2023-09-13 NOTE — Progress Notes (Signed)
 Mobility Specialist - Progress Note  ( 5L) Pre-mobility: 75 bpm HR, 96% SpO2 Post mobility: 78 bpm HR, 91% SpO2   09/13/23 1532  Mobility  Activity Dangled on edge of bed  Level of Assistance Minimal assist, patient does 75% or more  Range of Motion/Exercises Active Assistive  Mobility visit 1 Mobility  Mobility Specialist Start Time (ACUTE ONLY) 1443  Mobility Specialist Stop Time (ACUTE ONLY) 1532  Mobility Specialist Time Calculation (min) (ACUTE ONLY) 49 min   Pt was found in bed and agreeable to mobilize. Pt found to be soiled but able to sit EOB. Upon requesting pt to stand to assist with pericare stated being unable to.  NT entered room to assist but pt became emotional and declined further mobility. Returned to bed and assisted with pericare in bed. Was left in bed with all needs met. Call bell in reach.  Lorna Rose,  Mobility Specialist Can be reached via Secure Chat

## 2023-09-14 ENCOUNTER — Other Ambulatory Visit (HOSPITAL_COMMUNITY): Payer: Self-pay

## 2023-09-14 ENCOUNTER — Telehealth (HOSPITAL_COMMUNITY): Payer: Self-pay | Admitting: Pharmacy Technician

## 2023-09-14 DIAGNOSIS — R0603 Acute respiratory distress: Secondary | ICD-10-CM | POA: Diagnosis not present

## 2023-09-14 LAB — CBC
HCT: 29.8 % — ABNORMAL LOW (ref 36.0–46.0)
Hemoglobin: 9.4 g/dL — ABNORMAL LOW (ref 12.0–15.0)
MCH: 30.2 pg (ref 26.0–34.0)
MCHC: 31.5 g/dL (ref 30.0–36.0)
MCV: 95.8 fL (ref 80.0–100.0)
Platelets: 238 10*3/uL (ref 150–400)
RBC: 3.11 MIL/uL — ABNORMAL LOW (ref 3.87–5.11)
RDW: 12.1 % (ref 11.5–15.5)
WBC: 13.9 10*3/uL — ABNORMAL HIGH (ref 4.0–10.5)
nRBC: 0 % (ref 0.0–0.2)

## 2023-09-14 LAB — BASIC METABOLIC PANEL WITH GFR
Anion gap: 9 (ref 5–15)
BUN: 28 mg/dL — ABNORMAL HIGH (ref 8–23)
CO2: 24 mmol/L (ref 22–32)
Calcium: 8.1 mg/dL — ABNORMAL LOW (ref 8.9–10.3)
Chloride: 94 mmol/L — ABNORMAL LOW (ref 98–111)
Creatinine, Ser: 0.62 mg/dL (ref 0.44–1.00)
GFR, Estimated: >60 mL/min (ref >60)
Glucose, Bld: 117 mg/dL — ABNORMAL HIGH (ref 70–99)
Potassium: 4.5 mmol/L (ref 3.5–5.1)
Sodium: 127 mmol/L — ABNORMAL LOW (ref 135–145)

## 2023-09-14 LAB — GLUCOSE, CAPILLARY
Glucose-Capillary: 112 mg/dL — ABNORMAL HIGH (ref 70–99)
Glucose-Capillary: 141 mg/dL — ABNORMAL HIGH (ref 70–99)
Glucose-Capillary: 153 mg/dL — ABNORMAL HIGH (ref 70–99)
Glucose-Capillary: 162 mg/dL — ABNORMAL HIGH (ref 70–99)

## 2023-09-14 MED ORDER — SODIUM CHLORIDE 1 G PO TABS
1.0000 g | ORAL_TABLET | Freq: Three times a day (TID) | ORAL | Status: DC
  Administered 2023-09-14 – 2023-09-19 (×15): 1 g via ORAL
  Filled 2023-09-14 (×16): qty 1

## 2023-09-14 NOTE — Progress Notes (Signed)
 Physical Therapy Treatment Patient Details Name: Sara Barnes MRN: [id number] DOB: [birth year] Today's Date: 09/14/2023   History of Present Illness Sara Barnes is an 83 yo female admitted [facility] from [facility] with respiratory distress. Dx with sepsis due to PNA, Afib, RVR; while in ED had contrast reaction. Thoracentesis 09/08/23. Pt s/p L ORIF from hip fx on 08/14/23 and sent to rehab at [facility]. PMH: c-diff, ileostomy and revision 2019, arthritis, anxiety, anemia, basal cell carcinoma, L breast Ca, GERD, lung Ca, hyperlipidemia, depression, Crohn's disease    PT Comments  Pt agreeable to working with therapy. A bit anxious-had some difficulty with mobilizing on yesterday. She participated and performed well on today. Remained on 8L HFNC-sats 87% with minimal activity, dyspnea 3/4. Seated rest breaks taken between 2 short walks in the room. Pt is motivated to progress medically and physically. Patient will benefit from continued inpatient follow up therapy, <3 hours/day, if agreeable.   If plan is discharge home, recommend the following: A little help with walking and/or transfers;A little help with bathing/dressing/bathroom;Assistance with cooking/housework;Assist for transportation;Help with stairs or ramp for entrance   Can travel by private vehicle     No  Equipment Recommendations   (TBD at next venue)    Recommendations for Other Services       Precautions / Restrictions Precautions Precautions: Fall Precaution/Restrictions Comments: monitor O2 Restrictions Weight Bearing Restrictions Per Provider Order: No LLE Weight Bearing Per Provider Order: Weight bearing as tolerated     Mobility  Bed Mobility Overal bed mobility: Needs Assistance Bed Mobility: Sit to Supine       Sit to supine: Contact guard assist   General bed mobility comments: Increased time. Pt able perform task unassisted-a bit effortful. CGA for safety, lines.    Transfers Overall transfer  level: Needs assistance Equipment used: Rolling walker (2 wheels) Transfers: Sit to/from Stand Sit to Stand: Min assist           General transfer comment: x2. Cues for safety, technique, hand placement. Increased time. Assist to rise, steady, control descent.    Ambulation/Gait Ambulation/Gait assistance: Min assist, +2 safety/equipment Gait Distance (Feet): 5 Feet (x2) Assistive device: Rolling walker (2 wheels) Gait Pattern/deviations: Decreased stride length, Decreased step length - right, Decreased step length - left       General Gait Details: Slow and effortful. Assist to stabilize pt, manage equipment/lines. Fatigues quickly. O2 87% on 8L, dyspnea 3/4. Cues for pursed lip breathing. Rest break taken between walks.   Stairs             Wheelchair Mobility     Tilt Bed    Modified Rankin (Stroke Patients Only)       Balance Overall balance assessment: Needs assistance         Standing balance support: Bilateral upper extremity supported, During functional activity, Reliant on assistive device for balance Standing balance-Leahy Scale: Poor                              Communication Communication Communication: Impaired Factors Affecting Communication: Hearing impaired  Cognition Arousal: Alert Behavior During Therapy: WFL for tasks assessed/performed   PT - Cognitive impairments: No apparent impairments                         Following commands: Intact      Cueing Cueing Techniques: Verbal cues  Exercises  General Comments        Pertinent Vitals/Pain Pain Assessment Pain Assessment: Faces Faces Pain Scale: Hurts a little bit Pain Location: L hip Pain Descriptors / Indicators: Discomfort, Sore Pain Intervention(s): Limited activity within patient's tolerance, Monitored during session, Repositioned    Home Living                          Prior Function            PT Goals (current goals  can now be found in the care plan section) Progress towards PT goals: Progressing toward goals    Frequency    Min 2X/week      PT Plan      Co-evaluation              AM-PAC PT "6 Clicks" Mobility   Outcome Measure  Help needed turning from your back to your side while in a flat bed without using bedrails?: A Little Help needed moving from lying on your back to sitting on the side of a flat bed without using bedrails?: A Little Help needed moving to and from a bed to a chair (including a wheelchair)?: A Little Help needed standing up from a chair using your arms (e.g., wheelchair or bedside chair)?: A Little Help needed to walk in hospital room?: A Lot Help needed climbing 3-5 steps with a railing? : Total 6 Click Score: 15    End of Session Equipment Utilized During Treatment: Gait belt;Oxygen Activity Tolerance: Patient limited by fatigue Patient left: in bed;with call bell/phone within reach;with bed alarm set;with family/visitor present   PT Visit Diagnosis: Muscle weakness (generalized) (M62.81);History of falling (Z91.81);Difficulty in walking, not elsewhere classified (R26.2)     Time: 8657-8469 PT Time Calculation (min) (ACUTE ONLY): 33 min  Charges:    $Gait Training: 23-37 mins PT General Charges $$ ACUTE PT VISIT: 1 Visit                        Tanda Falter, PT Acute Rehabilitation  Office: [phone]

## 2023-09-14 NOTE — Progress Notes (Signed)
 PT Cancellation Note  Patient Details Name: VIVAN VANDERVEER MRN: [id number] DOB: Apr 26, 1941   Cancelled Treatment:    Reason Eval/Treat Not Completed: Request by MD to hold PT session until this afternoon. Will check back as schedule allows.    Tanda Falter, PT Acute Rehabilitation  Office: [phone]

## 2023-09-14 NOTE — Telephone Encounter (Signed)
 Patient Product/process development scientist completed.    The patient is insured through [employer]. Patient has Medicare and is not eligible for a copay card, but may be able to apply for patient assistance or Medicare RX Payment Plan (Patient Must reach out to their plan, if eligible for payment plan), if available.    Ran test claim for Eliquis  5 mg and the current 30 day co-pay is $282.02 due to a $250.00 deductible.  Will be $47.00 once deductible is met.   This test claim was processed through [facility]- copay amounts may vary at other pharmacies due to pharmacy/plan contracts, or as the patient moves through the different stages of their insurance plan.     Morgan Arab, CPHT Pharmacy Technician III Certified Patient Advocate [facility] Patient Advocate Team Direct Number: [phone]  Fax: [phone]

## 2023-09-14 NOTE — TOC Progression Note (Signed)
 Transition of Care [facility]) - Progression Note   Patient Details  Name: Sara Barnes MRN: [id number] Date of Birth: Aug 29, 1940  [facility] CM/SW Contact  Zenon Hilda, LCSW Phone Number: 09/14/2023, 10:54 AM  Clinical Narrative: Patient did not have any bed offers this morning, so referral was faxed out again.  Expected Discharge Plan: Skilled Nursing Facility (vs. home with [facility]) Barriers to Discharge: Continued Medical Work up  Expected Discharge Plan and Services In-house Referral: Clinical Social Work Living arrangements for the past 2 months: Single Family Home (*at [facility] for ~ 2 weeks just prior to this admission for purpose of rehab)  Social Determinants of Health (SDOH) Interventions SDOH Screenings   Food Insecurity: No Food Insecurity (09/08/2023)  Housing: Low Risk  (09/08/2023)  Transportation Needs: No Transportation Needs (09/08/2023)  Utilities: Not At Risk (09/08/2023)  Alcohol Screen: Low Risk  (03/26/2021)  Depression (PHQ2-9): Low Risk  (03/28/2022)  Financial Resource Strain: Low Risk  (03/26/2021)  Physical Activity: Sufficiently Active (03/26/2021)  Social Connections: Moderately Isolated (09/08/2023)  Stress: No Stress Concern Present (03/26/2021)  Tobacco Use: Low Risk  (09/07/2023)   Readmission Risk Interventions    09/08/2023    3:24 PM  Readmission Risk Prevention Plan  Transportation Screening Complete  PCP or Specialist Appt within 3-5 Days Complete  HRI or Home Care Consult Complete  Social Work Consult for Recovery Care Planning/Counseling Complete  Medication Review Oceanographer) Complete

## 2023-09-14 NOTE — Plan of Care (Signed)
°  Problem: Activity: Goal: Ability to tolerate increased activity will improve Outcome: Progressing   Problem: Respiratory: Goal: Ability to maintain adequate ventilation will improve Outcome: Progressing Goal: Ability to maintain a clear airway will improve Outcome: Progressing   Problem: Activity: Goal: Risk for activity intolerance will decrease Outcome: Progressing

## 2023-09-14 NOTE — Progress Notes (Signed)
 PROGRESS NOTE    Sara Barnes  MVH:[id number] DOB: 1940/08/16 DOA: 09/06/2023 PCP: Estill Hemming, DO     Brief Narrative:  Sara Barnes is an 83yo with h/o Crohn's s/p ileostomy, metastatic breast CA, afib not on AC due to hepatic hematoma, anxiety/depression, and recent hip repair currently in rehab who presented on 5/11 with SOB. While in the ER, she experienced a contrast reaction, as well. She is on BIPAP and was given steroids, benadryl , H2 blocker. She was diagnosed with sepsis due to viral PNA and afib with RVR.  Family resistant to palliative care evaluations and have refused to have further conversations.  Patient is slow to recover.  Patient is willing to consider going to a skilled nursing facility once medically ready.  New events last 24 hours / Subjective: Upset about how mobility went yesterday with technician.  States that she is very motivated to work with physical therapy, but is needing a lot of assistance.  Has not really been getting much sleep due to IV Solu-Medrol , wants to hold off on mobility until after lunch.  Assessment & Plan:   Principal Problem:   Respiratory distress Active Problems:   Allergic reaction to contrast   History of Crohn's disease   Cancer of lower-inner quadrant of left female breast (HCC)   Sepsis due to pneumonia (HCC)   Hyponatremia   Breast cancer (HCC)   Acute metabolic encephalopathy   History of hip fracture   Atrial fibrillation with RVR (HCC)   Pneumonia of right upper lobe due to infectious organism   Paroxysmal A-fib (HCC)   Human metapneumovirus (hMPV) pneumonia   Malignant neoplasm of breast, stage 4 (HCC)   Atrial fibrillation with controlled ventricular rate (HCC)   Acute hypoxemic respiratory failure secondary to pneumonia - Remains on 5 L nasal cannula O2, continue to wean as able  Sepsis secondary to viral pneumonia - Tested positive for metapneumovirus - Sepsis present on admission - Completed  Solu-Medrol  - Continues to improve slowly  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 Findings are worrisome for metastatic disease" - Follow-up with Dr. Maryalice Smaller outpatient  Paroxysmal A-fib with RVR - Now off Cardizem  drip and on oral Cardizem  - No hematoma seen on CT.  This was discussed with patient and sister.  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 - Fluid restriction - Start salt tabs  Acute metabolic encephalopathy - Resolved  Mood disorder, anxiety - Xanax , fluoxetine   Recent history of hip fracture - PT OT recommending SNF placement  Crohn's disease - Resume home dose of daily prednisone      DVT prophylaxis:  SCDs Start: 09/07/23 0451 Place TED hose Start: 09/07/23 0451 apixaban  (ELIQUIS ) tablet 5 mg  Code Status: Full code Family Communication: Sister at bedside Disposition Plan: Home with home health versus SNF  Status is: Inpatient Remains inpatient appropriate because: She needs physical therapy, continue to wean down on oxygen    Antimicrobials:  Anti-infectives (From admission, onward)    Start     Dose/Rate Route Frequency Ordered Stop   09/08/23 0800  Vancomycin  (VANCOCIN ) 1,250 mg in sodium chloride  0.9 % 250 mL IVPB  Status:  Discontinued        1,250 mg 166.7 mL/hr over 90 Minutes Intravenous Every 24 hours 09/07/23 0501 09/08/23 0920   09/07/23 0500  ceFEPIme  (MAXIPIME ) 2 g in sodium chloride  0.9 %  100 mL IVPB  Status:  Discontinued        2 g 200 mL/hr over 30 Minutes Intravenous Every 12 hours 09/07/23 0452 09/09/23 1152   09/07/23 0500  vancomycin  (VANCOREADY) IVPB 1500 mg/300 mL        1,500 mg 150 mL/hr over 120 Minutes Intravenous  Once 09/07/23 0452 09/07/23 1040   09/06/23 2300  cefTRIAXone  (ROCEPHIN ) 1 g in sodium chloride  0.9 % 100 mL IVPB        1 g 200 mL/hr over 30 Minutes Intravenous  Once 09/06/23 2246 09/06/23 2344   09/06/23  2300  azithromycin  (ZITHROMAX ) 500 mg in sodium chloride  0.9 % 250 mL IVPB        500 mg 250 mL/hr over 60 Minutes Intravenous  Once 09/06/23 2246 09/07/23 0044        Objective: Vitals:   09/13/23 1937 09/13/23 2009 09/14/23 0458 09/14/23 0755  BP:  133/74 118/72   Pulse:  (!) 103 83 74  Resp:  18 19 20   Temp:  97.9 F (36.6 C) (!) 97.5 F (36.4 C)   TempSrc:  Oral    SpO2: 98% 95% 95% 96%  Weight:      Height:        Intake/Output Summary (Last 24 hours) at 09/14/2023 1039 Last data filed at 09/14/2023 0005 Gross per 24 hour  Intake 240 ml  Output --  Net 240 ml   Filed Weights   09/06/23 2227  Weight: 78.9 kg   Examination: General exam: Appears calm and comfortable  Respiratory system: Clear to auscultation anteriorly. Respiratory effort normal. Cardiovascular system: S1 & S2 heard, irregular rhythm. Gastrointestinal system: Abdomen is nondistended, soft  Central nervous system: Alert and oriented.  Psychiatry: Judgement and insight appear stable. Mood & affect appropriate.    Data Reviewed: I have personally reviewed following labs and imaging studies  CBC: Recent Labs  Lab 09/10/23 0454 09/11/23 0345 09/12/23 0410 09/13/23 0500 09/14/23 0336  WBC 13.6* 13.1* 14.0* 12.3* 13.9*  HGB 10.0* 9.6* 9.2* 9.4* 9.4*  HCT 30.9* 29.7* 28.9* 29.0* 29.8*  MCV 95.1 94.3 95.1 94.2 95.8  PLT 225 199 220 239 238   Basic Metabolic Panel: Recent Labs  Lab 09/09/23 0543 09/10/23 0454 09/11/23 0345 09/11/23 1137 09/11/23 1659 09/11/23 2254 09/12/23 0410 09/13/23 0500 09/14/23 0336  NA 128* 125* 122*   < > 121* 122* 127*  127* 127* 127*  K 3.5 3.6 3.9  --   --   --  4.3 4.0 4.5  CL 92* 92* 89*  --   --   --  93* 92* 94*  CO2 25 25 24   --   --   --  27 23 24   GLUCOSE 138* 114* 133*  --   --   --  131* 198* 117*  BUN 26* 22 21  --   --   --  29* 28* 28*  CREATININE 0.90 0.62 0.79  --   --   --  0.89 0.74 0.62  CALCIUM  8.5* 8.1* 7.9*  --   --   --  8.4* 8.3*  8.1*  MG 2.2  --   --   --   --   --   --   --   --    < > = values in this interval not displayed.   GFR: Estimated Creatinine Clearance: 58.4 mL/min (by C-G formula based on SCr of 0.62 mg/dL). Liver Function Tests: Recent Labs  Lab 09/08/23 1613  PROT 6.9   No results for input(s): "LIPASE", "AMYLASE" in the last 168 hours. No results for input(s): "AMMONIA" in the last 168 hours. Coagulation Profile: No results for input(s): "INR", "PROTIME" in the last 168 hours.  Cardiac Enzymes: No results for input(s): "CKTOTAL", "CKMB", "CKMBINDEX", "TROPONINI" in the last 168 hours. BNP (last 3 results) No results for input(s): "PROBNP" in the last 8760 hours. HbA1C: No results for input(s): "HGBA1C" in the last 72 hours. CBG: Recent Labs  Lab 09/12/23 2359 09/13/23 0600 09/13/23 1131 09/14/23 0005 09/14/23 0627  GLUCAP 141* 178* 147* 141* 112*   Lipid Profile: No results for input(s): "CHOL", "HDL", "LDLCALC", "TRIG", "CHOLHDL", "LDLDIRECT" in the last 72 hours. Thyroid  Function Tests: Recent Labs    09/13/23 0439 09/13/23 0500  TSH  --  0.129*  FREET4 0.93  --    Anemia Panel: No results for input(s): "VITAMINB12", "FOLATE", "FERRITIN", "TIBC", "IRON", "RETICCTPCT" in the last 72 hours. Sepsis Labs: Recent Labs  Lab 09/07/23 1723 09/11/23 0345 09/12/23 0410  PROCALCITON  --  0.12 <0.10  LATICACIDVEN 0.8  --   --     Recent Results (from the past 240 hours)  Blood Culture (routine x 2)     Status: None   Collection Time: 09/06/23 10:38 PM   Specimen: BLOOD  Result Value Ref Range Status   Specimen Description   Final    BLOOD SITE NOT SPECIFIED Performed at [facility], [address], [place], [place] [address]    Special Requests   Final    BOTTLES DRAWN AEROBIC AND ANAEROBIC Blood Culture results may not be optimal due to an inadequate volume of blood received in culture bottles Performed at [facility], [address], [place], [place] [address]    Culture   Final    NO GROWTH 5 DAYS Performed at [facility], [address], [place], [place] [address]    Report Status 09/12/2023 FINAL  Final  Resp panel by RT-PCR (RSV, Flu A&B, Covid) Anterior Nasal Swab     Status: None   Collection Time: 09/06/23 11:16 PM   Specimen: Anterior Nasal Swab  Result Value Ref Range Status   SARS Coronavirus 2 by RT PCR NEGATIVE NEGATIVE Final    Comment: (NOTE) SARS-CoV-2 target nucleic acids are NOT DETECTED.  The SARS-CoV-2 RNA is generally detectable in upper respiratory specimens during the acute phase of infection. The lowest concentration of SARS-CoV-2 viral copies this assay can detect is 138 copies/mL. A negative result does not preclude SARS-Cov-2 infection and should not be used as the sole basis for treatment or other patient management decisions. A negative result may occur with  improper specimen collection/handling, submission of specimen other than nasopharyngeal swab, presence of viral mutation(s) within the areas targeted by this assay, and inadequate number of viral copies(<138 copies/mL). A negative result must be combined with clinical observations, patient history, and epidemiological information. The expected result is Negative.  Fact Sheet for Patients:  BloggerCourse.com  Fact Sheet for Healthcare Providers:  SeriousBroker.it  This test is no t yet approved or cleared by the [place]  FDA and  has been authorized for detection and/or diagnosis of SARS-CoV-2 by FDA under an Emergency Use Authorization (EUA). This EUA will remain  in effect (meaning this test can be used) for the duration of the COVID-19 declaration under Section 564(b)(1) of the Act, 21 U.S.C.section 360bbb-3(b)(1), unless the authorization is terminated  or revoked sooner.  Influenza A by PCR NEGATIVE NEGATIVE Final   Influenza B by PCR  NEGATIVE NEGATIVE Final    Comment: (NOTE) The Xpert Xpress SARS-CoV-2/FLU/RSV plus assay is intended as an aid in the diagnosis of influenza from Nasopharyngeal swab specimens and should not be used as a sole basis for treatment. Nasal washings and aspirates are unacceptable for Xpert Xpress SARS-CoV-2/FLU/RSV testing.  Fact Sheet for Patients: BloggerCourse.com  Fact Sheet for Healthcare Providers: SeriousBroker.it  This test is not yet approved or cleared by the [place]  FDA and has been authorized for detection and/or diagnosis of SARS-CoV-2 by FDA under an Emergency Use Authorization (EUA). This EUA will remain in effect (meaning this test can be used) for the duration of the COVID-19 declaration under Section 564(b)(1) of the Act, 21 U.S.C. section 360bbb-3(b)(1), unless the authorization is terminated or revoked.     Resp Syncytial Virus by PCR NEGATIVE NEGATIVE Final    Comment: (NOTE) Fact Sheet for Patients: BloggerCourse.com  Fact Sheet for Healthcare Providers: SeriousBroker.it  This test is not yet approved or cleared by the [place]  FDA and has been authorized for detection and/or diagnosis of SARS-CoV-2 by FDA under an Emergency Use Authorization (EUA). This EUA will remain in effect (meaning this test can be used) for the duration of the COVID-19 declaration under Section 564(b)(1) of the Act, 21 U.S.C. section 360bbb-3(b)(1), unless the authorization is terminated or revoked.  Performed at [facility], [address] [place], [place] [address]   Blood Culture (routine x 2)     Status: None   Collection Time: 09/06/23 11:27 PM   Specimen: BLOOD RIGHT ARM  Result Value Ref Range Status   Specimen Description   Final    BLOOD RIGHT ARM Performed at [facility], [address] [place], [place] [address]    Special  Requests   Final    BOTTLES DRAWN AEROBIC ONLY Blood Culture adequate volume Performed at [facility], [address], [place], [place] [address]    Culture   Final    NO GROWTH 5 DAYS Performed at [facility], [address], [place], [place] [address]    Report Status 09/12/2023 FINAL  Final  MRSA Next Gen by PCR, Nasal     Status: None   Collection Time: 09/07/23 12:30 PM   Specimen: Nasal Mucosa; Nasal Swab  Result Value Ref Range Status   MRSA by PCR Next Gen NOT DETECTED NOT DETECTED Final    Comment: (NOTE) The GeneXpert MRSA Assay (FDA approved for NASAL specimens only), is one component of a comprehensive MRSA colonization surveillance program. It is not intended to diagnose MRSA infection nor to guide or monitor treatment for MRSA infections. Test performance is not FDA approved in patients less than 96 years old. Performed at [facility], [address], [place], [place] [address]   Respiratory (~20 pathogens) panel by PCR     Status: Abnormal   Collection Time: 09/08/23  3:49 PM   Specimen: Nasopharyngeal Swab; Respiratory  Result Value Ref Range Status   Adenovirus NOT DETECTED NOT DETECTED Final   Coronavirus 229E NOT DETECTED NOT DETECTED Final    Comment: (NOTE) The Coronavirus on the Respiratory Panel, DOES NOT test for the novel  Coronavirus (2019 nCoV)    Coronavirus HKU1 NOT DETECTED NOT DETECTED Final   Coronavirus NL63 NOT DETECTED NOT DETECTED Final   Coronavirus OC43 NOT DETECTED NOT DETECTED Final   Metapneumovirus DETECTED (  A) NOT DETECTED Final   Rhinovirus / Enterovirus NOT DETECTED NOT DETECTED Final   Influenza A NOT DETECTED NOT DETECTED Final   Influenza B NOT DETECTED NOT DETECTED Final   Parainfluenza Virus 1 NOT DETECTED NOT DETECTED Final   Parainfluenza Virus 2 NOT DETECTED NOT DETECTED Final   Parainfluenza Virus 3 NOT DETECTED NOT DETECTED Final   Parainfluenza Virus 4 NOT DETECTED NOT  DETECTED Final   Respiratory Syncytial Virus NOT DETECTED NOT DETECTED Final   Bordetella pertussis NOT DETECTED NOT DETECTED Final   Bordetella Parapertussis NOT DETECTED NOT DETECTED Final   Chlamydophila pneumoniae NOT DETECTED NOT DETECTED Final   Mycoplasma pneumoniae NOT DETECTED NOT DETECTED Final    Comment: Performed at [facility], [address], [place], [place] [address]  Body fluid culture w Gram Stain     Status: None   Collection Time: 09/08/23  4:00 PM   Specimen: Thoracentesis; Body Fluid  Result Value Ref Range Status   Specimen Description   Final    THORACENTESIS Performed at [facility], [address], [place], [place] [address]    Special Requests   Final    NONE Performed at [facility], [address] [place], [place] [address]    Gram Stain NO WBC SEEN NO ORGANISMS SEEN   Final   Culture   Final    NO GROWTH 3 DAYS Performed at [facility], [address], [place], [place] [address]    Report Status 09/12/2023 FINAL  Final      Radiology Studies: No results found.     Scheduled Meds:  apixaban   5 mg Oral BID   arformoterol   15 mcg Nebulization BID   Chlorhexidine  Gluconate Cloth  6 each Topical Daily   vitamin D3  1,000 Units Oral Daily   diltiazem   240 mg Oral Daily   FLUoxetine   20 mg Oral Daily   fluticasone   2 spray Each Nare Daily   guaiFENesin   600 mg Oral BID   nystatin   5 mL Oral QID   predniSONE   5 mg Oral Q breakfast   revefenacin   175 mcg Nebulization Daily   sodium chloride  flush  10-40 mL Intracatheter Q12H   sodium chloride  flush  3 mL Intravenous Q12H   sodium chloride   1 g Oral TID WC   Continuous Infusions:   LOS: 7 days   Time spent: 25 minutes   Daren Eck, DO [facility] 09/14/2023, 10:39 AM   Available via Epic secure chat 7am-7pm After these hours, please refer to coverage provider listed on [email or web address]

## 2023-09-15 ENCOUNTER — Inpatient Hospital Stay: Admitting: Hematology

## 2023-09-15 ENCOUNTER — Inpatient Hospital Stay

## 2023-09-15 ENCOUNTER — Inpatient Hospital Stay (HOSPITAL_COMMUNITY)

## 2023-09-15 DIAGNOSIS — R0603 Acute respiratory distress: Secondary | ICD-10-CM | POA: Diagnosis not present

## 2023-09-15 LAB — CBC
HCT: 31.9 % — ABNORMAL LOW (ref 36.0–46.0)
Hemoglobin: 10 g/dL — ABNORMAL LOW (ref 12.0–15.0)
MCH: 29.9 pg (ref 26.0–34.0)
MCHC: 31.3 g/dL (ref 30.0–36.0)
MCV: 95.2 fL (ref 80.0–100.0)
Platelets: 270 10*3/uL (ref 150–400)
RBC: 3.35 MIL/uL — ABNORMAL LOW (ref 3.87–5.11)
RDW: 12.3 % (ref 11.5–15.5)
WBC: 15.4 10*3/uL — ABNORMAL HIGH (ref 4.0–10.5)
nRBC: 0 % (ref 0.0–0.2)

## 2023-09-15 LAB — BASIC METABOLIC PANEL WITH GFR
Anion gap: 8 (ref 5–15)
BUN: 22 mg/dL (ref 8–23)
CO2: 25 mmol/L (ref 22–32)
Calcium: 8 mg/dL — ABNORMAL LOW (ref 8.9–10.3)
Chloride: 97 mmol/L — ABNORMAL LOW (ref 98–111)
Creatinine, Ser: 0.53 mg/dL (ref 0.44–1.00)
GFR, Estimated: >60 mL/min (ref >60)
Glucose, Bld: 97 mg/dL (ref 70–99)
Potassium: 4.3 mmol/L (ref 3.5–5.1)
Sodium: 130 mmol/L — ABNORMAL LOW (ref 135–145)

## 2023-09-15 LAB — GLUCOSE, CAPILLARY
Glucose-Capillary: 113 mg/dL — ABNORMAL HIGH (ref 70–99)
Glucose-Capillary: 142 mg/dL — ABNORMAL HIGH (ref 70–99)
Glucose-Capillary: 143 mg/dL — ABNORMAL HIGH (ref 70–99)
Glucose-Capillary: 94 mg/dL (ref 70–99)

## 2023-09-15 LAB — CYTOLOGY - NON PAP

## 2023-09-15 MED ORDER — METHYLPREDNISOLONE SODIUM SUCC 40 MG IJ SOLR
40.0000 mg | Freq: Every day | INTRAMUSCULAR | Status: DC
  Administered 2023-09-15 – 2023-09-16 (×2): 40 mg via INTRAVENOUS
  Filled 2023-09-15 (×2): qty 1

## 2023-09-15 MED ORDER — OXYCODONE HCL 5 MG PO TABS
5.0000 mg | ORAL_TABLET | Freq: Four times a day (QID) | ORAL | Status: DC | PRN
  Administered 2023-09-15 – 2023-09-16 (×5): 5 mg via ORAL
  Administered 2023-09-17: 10 mg via ORAL
  Administered 2023-09-17: 5 mg via ORAL
  Administered 2023-09-18 (×2): 10 mg via ORAL
  Filled 2023-09-15 (×3): qty 1
  Filled 2023-09-15: qty 2
  Filled 2023-09-15: qty 1
  Filled 2023-09-15: qty 2
  Filled 2023-09-15 (×4): qty 1

## 2023-09-15 MED ORDER — FUROSEMIDE 10 MG/ML IJ SOLN
40.0000 mg | Freq: Once | INTRAMUSCULAR | Status: AC
  Administered 2023-09-15: 40 mg via INTRAVENOUS
  Filled 2023-09-15: qty 4

## 2023-09-15 NOTE — Progress Notes (Signed)
 Rt placed pt on BIPAP (SERVO-I) due to WOB.

## 2023-09-15 NOTE — Progress Notes (Signed)
 PROGRESS NOTE    Sara Barnes  UJW:[id number] DOB: [birth year] DOA: 09/06/2023 PCP: Estill Hemming, DO     Brief Narrative:  Sara Barnes is an 83yo with h/o Crohn's s/p ileostomy, metastatic breast CA, afib not on AC due to hepatic hematoma, anxiety/depression, and recent hip repair currently in rehab who presented on 5/11 with SOB. While in the ER, she experienced a contrast reaction, as well. She is on BIPAP and was given steroids, benadryl , H2 blocker. She was diagnosed with sepsis due to viral PNA and afib with RVR.  Family resistant to palliative care evaluations and have refused to have further conversations.  Patient is slow to recover.  Patient is willing to consider going to a skilled nursing facility once medically ready.  New events last 24 hours / Subjective: Had an uneventful day yesterday, work with physical therapy.  States that she is more sleepy taking Xanax  and oxycodone .  We discussed weaning down on dose of oxycodone .  States that her breathing is not great today.  Is more short of breath.  She has been put back on BiPAP.  Assessment & Plan:   Principal Problem:   Respiratory distress Active Problems:   Allergic reaction to contrast   History of Crohn's disease   Cancer of lower-inner quadrant of left female breast (HCC)   Sepsis due to pneumonia (HCC)   Hyponatremia   Breast cancer (HCC)   Acute metabolic encephalopathy   History of hip fracture   Atrial fibrillation with RVR (HCC)   Pneumonia of right upper lobe due to infectious organism   Paroxysmal A-fib (HCC)   Human metapneumovirus (hMPV) pneumonia   Malignant neoplasm of breast, stage 4 (HCC)   Atrial fibrillation with controlled ventricular rate (HCC)   Acute hypoxemic respiratory failure secondary to pneumonia - Putting back on BiPAP today  Sepsis secondary to viral pneumonia - Tested positive for metapneumovirus - Sepsis present on admission - Repeat chest x-ray reviewed independently.   Await formal radiology report, but on my read does seem more fluid overloaded than previous.  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 Findings are worrisome for metastatic disease" - Follow-up with Dr. Maryalice Smaller outpatient  Paroxysmal A-fib with RVR - Now off Cardizem  drip and on oral Cardizem  - No hematoma seen on CT.  This was discussed with patient and sister.  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  Giving IV Lasix , IV Solu-Medrol     Antimicrobials:  Anti-infectives (From admission, onward)    Start     Dose/Rate Route Frequency Ordered Stop   09/08/23 0800  Vancomycin  (VANCOCIN ) 1,250 mg in sodium chloride  0.9 % 250 mL IVPB  Status:  Discontinued        1,250 mg 166.7 mL/hr over 90 Minutes Intravenous  Every 24 hours 09/07/23 0501 09/08/23 0920   09/07/23 0500  ceFEPIme  (MAXIPIME ) 2 g in sodium chloride  0.9 % 100 mL IVPB  Status:  Discontinued        2 g 200 mL/hr over 30 Minutes Intravenous Every 12 hours 09/07/23 0452 09/09/23 1152   09/07/23 0500  vancomycin  (VANCOREADY) IVPB 1500 mg/300 mL        1,500 mg 150 mL/hr over 120 Minutes Intravenous  Once 09/07/23 0452 09/07/23 1040   09/06/23 2300  cefTRIAXone  (ROCEPHIN ) 1 g in sodium chloride  0.9 % 100  mL IVPB        1 g 200 mL/hr over 30 Minutes Intravenous  Once 09/06/23 2246 09/06/23 2344   09/06/23 2300  azithromycin  (ZITHROMAX ) 500 mg in sodium chloride  0.9 % 250 mL IVPB        500 mg 250 mL/hr over 60 Minutes Intravenous  Once 09/06/23 2246 09/07/23 0044        Objective: Vitals:   09/14/23 2100 09/15/23 0428 09/15/23 0925 09/15/23 1041  BP: 130/85 124/75    Pulse: 92 98  97  Resp: 17 17    Temp: (!) 97.4 F (36.3 C) 98.3 F (36.8 C)    TempSrc: Oral     SpO2: 99% 97% 93% 99%  Weight:      Height:        Intake/Output Summary (Last 24 hours) at 09/15/2023 1106 Last data filed at 09/15/2023 1610 Gross per 24 hour  Intake 480 ml  Output 800 ml  Net -320 ml   Filed Weights   09/06/23 2227  Weight: 78.9 kg   Examination: General exam: Appears calm, increased work of breathing Respiratory system: Crackles worse on left greater than right.  Respiratory effort is increased Cardiovascular system: S1 & S2 heard Gastrointestinal system: Abdomen is nondistended Central nervous system: Alert and oriented. Non focal exam. Speech clear  Extremities: Symmetric in appearance bilaterally  Skin: No rashes, lesions or ulcers on exposed skin  Psychiatry: Judgement and insight appear stable. Mood & affect appropriate.    Data Reviewed: I have personally reviewed following labs and imaging studies  CBC: Recent Labs  Lab 09/11/23 0345 09/12/23 0410 09/13/23 0500 09/14/23 0336 09/15/23 0436  WBC 13.1* 14.0* 12.3* 13.9* 15.4*  HGB 9.6* 9.2* 9.4* 9.4* 10.0*  HCT 29.7* 28.9* 29.0* 29.8* 31.9*  MCV 94.3 95.1 94.2 95.8 95.2  PLT 199 220 239 238 270   Basic Metabolic Panel: Recent Labs  Lab 09/09/23 0543 09/10/23 0454 09/11/23 0345 09/11/23 1137 09/11/23 2254 09/12/23 0410 09/13/23 0500 09/14/23 0336 09/15/23 0436  NA 128*   < > 122*   < > 122* 127*  127* 127* 127* 130*  K 3.5   < > 3.9  --   --  4.3 4.0 4.5 4.3  CL 92*   < > 89*  --   --  93* 92* 94* 97*  CO2  25   < > 24  --   --  27 23 24 25   GLUCOSE 138*   < > 133*  --   --  131* 198* 117* 97  BUN 26*   < > 21  --   --  29* 28* 28* 22  CREATININE 0.90   < > 0.79  --   --  0.89 0.74 0.62 0.53  CALCIUM  8.5*   < > 7.9*  --   --  8.4* 8.3* 8.1* 8.0*  MG 2.2  --   --   --   --   --   --   --   --    < > =  values in this interval not displayed.   GFR: Estimated Creatinine Clearance: 58.4 mL/min (by C-G formula based on SCr of 0.53 mg/dL). Liver Function Tests: Recent Labs  Lab 09/08/23 1613  PROT 6.9   No results for input(s): "LIPASE", "AMYLASE" in the last 168 hours. No results for input(s): "AMMONIA" in the last 168 hours. Coagulation Profile: No results for input(s): "INR", "PROTIME" in the last 168 hours.  Cardiac Enzymes: No results for input(s): "CKTOTAL", "CKMB", "CKMBINDEX", "TROPONINI" in the last 168 hours. BNP (last 3 results) No results for input(s): "PROBNP" in the last 8760 hours. HbA1C: No results for input(s): "HGBA1C" in the last 72 hours. CBG: Recent Labs  Lab 09/14/23 0627 09/14/23 1215 09/14/23 1717 09/15/23 0011 09/15/23 0604  GLUCAP 112* 153* 162* 142* 94   Lipid Profile: No results for input(s): "CHOL", "HDL", "LDLCALC", "TRIG", "CHOLHDL", "LDLDIRECT" in the last 72 hours. Thyroid  Function Tests: Recent Labs    09/13/23 0439 09/13/23 0500  TSH  --  0.129*  FREET4 0.93  --    Anemia Panel: No results for input(s): "VITAMINB12", "FOLATE", "FERRITIN", "TIBC", "IRON", "RETICCTPCT" in the last 72 hours. Sepsis Labs: Recent Labs  Lab 09/11/23 0345 09/12/23 0410  PROCALCITON 0.12 <0.10    Recent Results (from the past 240 hours)  Blood Culture (routine x 2)     Status: None   Collection Time: 09/06/23 10:38 PM   Specimen: BLOOD  Result Value Ref Range Status   Specimen Description   Final    BLOOD SITE NOT SPECIFIED Performed at [facility], [address], [place], [place] [address]    Special Requests   Final    BOTTLES  DRAWN AEROBIC AND ANAEROBIC Blood Culture results may not be optimal due to an inadequate volume of blood received in culture bottles Performed at [facility], [name], [place], [place] [address]    Culture   Final    NO GROWTH 5 DAYS Performed at [facility], [address] [place], [place] [address]    Report Status 09/12/2023 FINAL  Final  Resp panel by RT-PCR (RSV, Flu A&B, Covid) Anterior Nasal Swab     Status: None   Collection Time: 09/06/23 11:16 PM   Specimen: Anterior Nasal Swab  Result Value Ref Range Status   SARS Coronavirus 2 by RT PCR NEGATIVE NEGATIVE Final    Comment: (NOTE) SARS-CoV-2 target nucleic acids are NOT DETECTED.  The SARS-CoV-2 RNA is generally detectable in upper respiratory specimens during the acute phase of infection. The lowest concentration of SARS-CoV-2 viral copies this assay can detect is 138 copies/mL. A negative result does not preclude SARS-Cov-2 infection and should not be used as the sole basis for treatment or other patient management decisions. A negative result may occur with  improper specimen collection/handling, submission of specimen other than nasopharyngeal swab, presence of viral mutation(s) within the areas targeted by this assay, and inadequate number of viral copies(<138 copies/mL). A negative result must be combined with clinical observations, patient history, and epidemiological information. The expected result is Negative.  Fact Sheet for Patients:  BloggerCourse.com  Fact Sheet for Healthcare Providers:  SeriousBroker.it  This test is no t yet approved or cleared by the [place]  FDA and  has been authorized for detection and/or diagnosis of SARS-CoV-2 by FDA under an Emergency Use Authorization (EUA). This EUA will remain  in effect (meaning this test can be used) for the duration of the COVID-19 declaration under Section  564(b)(1)  of the Act, 21 U.S.C.section 360bbb-3(b)(1), unless the authorization is terminated  or revoked sooner.       Influenza A by PCR NEGATIVE NEGATIVE Final   Influenza B by PCR NEGATIVE NEGATIVE Final    Comment: (NOTE) The Xpert Xpress SARS-CoV-2/FLU/RSV plus assay is intended as an aid in the diagnosis of influenza from Nasopharyngeal swab specimens and should not be used as a sole basis for treatment. Nasal washings and aspirates are unacceptable for Xpert Xpress SARS-CoV-2/FLU/RSV testing.  Fact Sheet for Patients: BloggerCourse.com  Fact Sheet for Healthcare Providers: SeriousBroker.it  This test is not yet approved or cleared by the [place]  FDA and has been authorized for detection and/or diagnosis of SARS-CoV-2 by FDA under an Emergency Use Authorization (EUA). This EUA will remain in effect (meaning this test can be used) for the duration of the COVID-19 declaration under Section 564(b)(1) of the Act, 21 U.S.C. section 360bbb-3(b)(1), unless the authorization is terminated or revoked.     Resp Syncytial Virus by PCR NEGATIVE NEGATIVE Final    Comment: (NOTE) Fact Sheet for Patients: BloggerCourse.com  Fact Sheet for Healthcare Providers: SeriousBroker.it  This test is not yet approved or cleared by the [place]  FDA and has been authorized for detection and/or diagnosis of SARS-CoV-2 by FDA under an Emergency Use Authorization (EUA). This EUA will remain in effect (meaning this test can be used) for the duration of the COVID-19 declaration under Section 564(b)(1) of the Act, 21 U.S.C. section 360bbb-3(b)(1), unless the authorization is terminated or revoked.  Performed at [facility], [address], [place], [place] [address]   Blood Culture (routine x 2)     Status: None   Collection Time: 09/06/23 11:27 PM   Specimen: BLOOD RIGHT  ARM  Result Value Ref Range Status   Specimen Description   Final    BLOOD RIGHT ARM Performed at [facility], [address], [place], [place] [address]    Special Requests   Final    BOTTLES DRAWN AEROBIC ONLY Blood Culture adequate volume Performed at [facility], [address], [place], [place] [address]    Culture   Final    NO GROWTH 5 DAYS Performed at [facility], [address] [place], [place] [address]    Report Status 09/12/2023 FINAL  Final  MRSA Next Gen by PCR, Nasal     Status: None   Collection Time: 09/07/23 12:30 PM   Specimen: Nasal Mucosa; Nasal Swab  Result Value Ref Range Status   MRSA by PCR Next Gen NOT DETECTED NOT DETECTED Final    Comment: (NOTE) The GeneXpert MRSA Assay (FDA approved for NASAL specimens only), is one component of a comprehensive MRSA colonization surveillance program. It is not intended to diagnose MRSA infection nor to guide or monitor treatment for MRSA infections. Test performance is not FDA approved in patients less than 73 years old. Performed at [facility], [address], [place], [place] [address]   Respiratory (~20 pathogens) panel by PCR     Status: Abnormal   Collection Time: 09/08/23  3:49 PM   Specimen: Nasopharyngeal Swab; Respiratory  Result Value Ref Range Status   Adenovirus NOT DETECTED NOT DETECTED Final   Coronavirus 229E NOT DETECTED NOT DETECTED Final    Comment: (NOTE) The Coronavirus on the Respiratory Panel, DOES NOT test for the novel  Coronavirus (2019 nCoV)    Coronavirus HKU1 NOT DETECTED NOT DETECTED Final  Coronavirus NL63 NOT DETECTED NOT DETECTED Final   Coronavirus OC43 NOT DETECTED NOT DETECTED Final   Metapneumovirus DETECTED (A) NOT DETECTED Final   Rhinovirus / Enterovirus NOT DETECTED NOT DETECTED Final   Influenza A NOT DETECTED NOT DETECTED Final   Influenza B NOT DETECTED NOT DETECTED Final   Parainfluenza Virus 1 NOT DETECTED  NOT DETECTED Final   Parainfluenza Virus 2 NOT DETECTED NOT DETECTED Final   Parainfluenza Virus 3 NOT DETECTED NOT DETECTED Final   Parainfluenza Virus 4 NOT DETECTED NOT DETECTED Final   Respiratory Syncytial Virus NOT DETECTED NOT DETECTED Final   Bordetella pertussis NOT DETECTED NOT DETECTED Final   Bordetella Parapertussis NOT DETECTED NOT DETECTED Final   Chlamydophila pneumoniae NOT DETECTED NOT DETECTED Final   Mycoplasma pneumoniae NOT DETECTED NOT DETECTED Final    Comment: Performed at [facility], [address], [place], [place] [address]  Body fluid culture w Gram Stain     Status: None   Collection Time: 09/08/23  4:00 PM   Specimen: Thoracentesis; Body Fluid  Result Value Ref Range Status   Specimen Description   Final    THORACENTESIS Performed at [facility], [address], [place], [place] [address]    Special Requests   Final    NONE Performed at [facility], [address] [place], [place] [address]    Gram Stain NO WBC SEEN NO ORGANISMS SEEN   Final   Culture   Final    NO GROWTH 3 DAYS Performed at [facility], [address], [place], [place] [address]    Report Status 09/12/2023 FINAL  Final      Radiology Studies: No results found.     Scheduled Meds:  apixaban   5 mg Oral BID   arformoterol   15 mcg Nebulization BID   Chlorhexidine  Gluconate Cloth  6 each Topical Daily   vitamin D3  1,000 Units Oral Daily   diltiazem   240 mg Oral Daily   FLUoxetine   20 mg Oral Daily   fluticasone   2 spray Each Nare Daily   guaiFENesin   600 mg Oral BID   methylPREDNISolone  (SOLU-MEDROL ) injection  40 mg Intravenous Daily   nystatin   5 mL Oral QID   revefenacin   175 mcg Nebulization Daily   sodium chloride  flush  10-40 mL Intracatheter Q12H   sodium chloride  flush  3 mL Intravenous Q12H   sodium chloride   1 g Oral TID WC   Continuous Infusions:   LOS: 8 days   Time spent: 35 minutes   Daren Eck, DO [facility] 09/15/2023, 11:06 AM   Available via Epic secure chat 7am-7pm After these hours, please refer to coverage provider listed on [email or web address]

## 2023-09-15 NOTE — Progress Notes (Signed)
 OT Cancellation Note  Patient Details Name: Sara Barnes MRN: [id number] DOB: 17-Oct-1940   Cancelled Treatment:    Reason Eval/Treat Not Completed: Medical issues which prohibited therapy Nurse asking for therapy to hold off at this time indicating patient may need BiPAP in near future with increased SOB at this time. OT to continue to follow and check back as schedule will allow.  Wynette Heckler, MS Acute Rehabilitation Department Office# [phone]  09/15/2023, 10:27 AM

## 2023-09-15 NOTE — Plan of Care (Signed)
  Problem: Activity: Goal: Ability to tolerate increased activity will improve Outcome: Not Progressing   Problem: Respiratory: Goal: Ability to maintain adequate ventilation will improve Outcome: Not Progressing Goal: Ability to maintain a clear airway will improve Outcome: Not Progressing

## 2023-09-15 NOTE — Progress Notes (Signed)
 PT Cancellation Note  Patient Details Name: Sara Barnes MRN: [id number] DOB: 1941/04/24   Cancelled Treatment:    Reason Eval/Treat Not Completed: Medical issues which prohibited therapy--spoke with RN who recommended therapy be held on today-pt on bipap. will check back another day as schedule allows.    Tanda Falter, PT Acute Rehabilitation  Office: [phone]

## 2023-09-15 NOTE — Progress Notes (Signed)
 PT just ate breakfast - may need BiPAP for WOB. Will wait for food to settle then reassess for BiPAP. RN Aware.

## 2023-09-15 NOTE — Progress Notes (Signed)
 PT utilized BiPAP for bilateral fine crackles and increased work of breathing from approximately 1040 until 1230. PT appeared to wake up anxious and demanded BiPAP be removed. PT has received Lasix  and has increased urine output with less work of breathing. PT is now on 6 LPM salter 02 system, vitals are stable. RN aware.

## 2023-09-15 NOTE — TOC CM/SW Note (Signed)
 Patient suffers from respiratory distress and stage 4 malignant neoplasm of breast which impairs their ability to perform daily activities like dressing, grooming bathing, and toileting in the home. A walker will not resolve issue with performing activities of daily living. A wheelchair will allow patient to safely perform daily activities. Patient is not able to propel themselves in the home using a standard weight wheelchair due to general weakness and endurance. Patient can self-propel in the lightweight wheelchair.     Durable Medical Equipment  (From admission, onward)           Start     Ordered   09/15/23 1149  For home use only DME lightweight manual wheelchair with seat cushion  Once       Comments: Patient suffers from Acute hypoxemic respiratory failure secondary to pneumonia which impairs their ability to perform daily activities like bathing, dressing, feeding, grooming, and toileting in the home.  A cane, crutch, or walker will not resolve  issue with performing activities of daily living. A wheelchair will allow patient to safely perform daily activities. Patient is not able to propel themselves in the home using a standard weight wheelchair due to endurance and general weakness. Patient can self propel in the lightweight wheelchair. Length of need 6 months . Accessories: elevating leg rests (ELRs), wheel locks, extensions and anti-tippers.   09/15/23 1149   09/15/23 1149  For home use only DME Bedside commode  Once       Question:  Patient needs a bedside commode to treat with the following condition  Answer:  Respiratory failure (HCC)   09/15/23 1149

## 2023-09-15 NOTE — Plan of Care (Signed)
  Problem: Activity: Goal: Ability to tolerate increased activity will improve Outcome: Progressing   Problem: Clinical Measurements: Goal: Ability to maintain a body temperature in the normal range will improve Outcome: Progressing   Problem: Respiratory: Goal: Ability to maintain adequate ventilation will improve Outcome: Progressing Goal: Ability to maintain a clear airway will improve Outcome: Progressing   

## 2023-09-15 NOTE — TOC Progression Note (Signed)
 Transition of Care [facility]) - Progression Note   Patient Details  Name: Sara Barnes MRN: [id number] Date of Birth: 03/02/1941  [facility] CM/SW Contact  Zenon Hilda, LCSW Phone Number: 09/15/2023, 11:10 AM  Clinical Narrative: CSW met with patient and sister, Dal Dubin, to provide bed offers. Patient appeared to be resting on BiPAP. Patient received the following bed offers:  [facility] [address] [place], [place] [address] [phone] Overall rating ? Much below average  [facility] [address] [place], [place] [address] [phone] Overall rating ?? Below average  Sister reported the plan is now for the patient to discharge home with [facility] rather than return to rehab. Sister reported patient will need a lightweight transport chair and BSC. Sister aware BSC will be private pay and DME will have to be set up with [employer] due to patient's [employer]. Sister requested highest rated HH agency on [employer] website [facility]).  CSW made DME referral to [place] with [employer]. Wheelchair narrative completed and cosigned by hospitalist. CSW made referral to [place] with [facility], which was accepted. HH and DME orders placed by hospitalist. CSW updated sister.  Expected Discharge Plan: Skilled Nursing Facility (vs. home with [facility]) Barriers to Discharge: Continued Medical Work up  Expected Discharge Plan and Services In-house Referral: Clinical Social Work Living arrangements for the past 2 months: Single Family Home (*at [facility] for ~ 2 weeks just prior to this admission for purpose of rehab)  Social Determinants of Health (SDOH) Interventions SDOH Screenings   Food Insecurity: No Food Insecurity (09/08/2023)  Housing: Low Risk  (09/08/2023)  Transportation Needs: No Transportation Needs (09/08/2023)  Utilities: Not At Risk (09/08/2023)  Alcohol Screen: Low Risk  (03/26/2021)  Depression (PHQ2-9): Low Risk  (03/28/2022)   Financial Resource Strain: Low Risk  (03/26/2021)  Physical Activity: Sufficiently Active (03/26/2021)  Social Connections: Moderately Isolated (09/08/2023)  Stress: No Stress Concern Present (03/26/2021)  Tobacco Use: Low Risk  (09/07/2023)   Readmission Risk Interventions    09/08/2023    3:24 PM  Readmission Risk Prevention Plan  Transportation Screening Complete  PCP or Specialist Appt within 3-5 Days Complete  HRI or Home Care Consult Complete  Social Work Consult for Recovery Care Planning/Counseling Complete  Medication Review [employer]) Complete

## 2023-09-16 ENCOUNTER — Inpatient Hospital Stay (HOSPITAL_COMMUNITY)

## 2023-09-16 ENCOUNTER — Encounter: Payer: Self-pay | Admitting: Hematology

## 2023-09-16 DIAGNOSIS — J123 Human metapneumovirus pneumonia: Secondary | ICD-10-CM | POA: Diagnosis not present

## 2023-09-16 DIAGNOSIS — J90 Pleural effusion, not elsewhere classified: Secondary | ICD-10-CM

## 2023-09-16 DIAGNOSIS — E871 Hypo-osmolality and hyponatremia: Secondary | ICD-10-CM | POA: Diagnosis not present

## 2023-09-16 DIAGNOSIS — J9601 Acute respiratory failure with hypoxia: Secondary | ICD-10-CM | POA: Diagnosis not present

## 2023-09-16 LAB — BODY FLUID CELL COUNT WITH DIFFERENTIAL
Eos, Fluid: 0 %
Lymphs, Fluid: 78 %
Monocyte-Macrophage-Serous Fluid: 11 % — ABNORMAL LOW (ref 50–90)
Neutrophil Count, Fluid: 11 % (ref 0–25)
Total Nucleated Cell Count, Fluid: 805 uL (ref 0–1000)

## 2023-09-16 LAB — BASIC METABOLIC PANEL WITH GFR
Anion gap: 8 (ref 5–15)
BUN: 21 mg/dL (ref 8–23)
CO2: 26 mmol/L (ref 22–32)
Calcium: 8.1 mg/dL — ABNORMAL LOW (ref 8.9–10.3)
Chloride: 95 mmol/L — ABNORMAL LOW (ref 98–111)
Creatinine, Ser: 0.45 mg/dL (ref 0.44–1.00)
GFR, Estimated: >60 mL/min (ref >60)
Glucose, Bld: 128 mg/dL — ABNORMAL HIGH (ref 70–99)
Potassium: 4 mmol/L (ref 3.5–5.1)
Sodium: 129 mmol/L — ABNORMAL LOW (ref 135–145)

## 2023-09-16 LAB — PROTEIN, PLEURAL OR PERITONEAL FLUID: Total protein, fluid: <3 g/dL

## 2023-09-16 LAB — CBC
HCT: 33.6 % — ABNORMAL LOW (ref 36.0–46.0)
Hemoglobin: 10.9 g/dL — ABNORMAL LOW (ref 12.0–15.0)
MCH: 30.2 pg (ref 26.0–34.0)
MCHC: 32.4 g/dL (ref 30.0–36.0)
MCV: 93.1 fL (ref 80.0–100.0)
Platelets: 293 10*3/uL (ref 150–400)
RBC: 3.61 MIL/uL — ABNORMAL LOW (ref 3.87–5.11)
RDW: 12.1 % (ref 11.5–15.5)
WBC: 16.8 10*3/uL — ABNORMAL HIGH (ref 4.0–10.5)
nRBC: 0 % (ref 0.0–0.2)

## 2023-09-16 LAB — GLUCOSE, CAPILLARY
Glucose-Capillary: 152 mg/dL — ABNORMAL HIGH (ref 70–99)
Glucose-Capillary: 166 mg/dL — ABNORMAL HIGH (ref 70–99)

## 2023-09-16 LAB — LACTATE DEHYDROGENASE, PLEURAL OR PERITONEAL FLUID: LD, Fluid: 173 U/L — ABNORMAL HIGH (ref 3–23)

## 2023-09-16 LAB — GLUCOSE, PLEURAL OR PERITONEAL FLUID: Glucose, Fluid: 140 mg/dL

## 2023-09-16 MED ORDER — APIXABAN 5 MG PO TABS: 5.0000 mg | ORAL_TABLET | Freq: Two times a day (BID) | ORAL | Status: DC

## 2023-09-16 MED ORDER — ZINC OXIDE 40 % EX OINT
TOPICAL_OINTMENT | Freq: Three times a day (TID) | CUTANEOUS | Status: DC
  Filled 2023-09-16: qty 57

## 2023-09-16 MED ORDER — LIDOCAINE HCL 1 % IJ SOLN
INTRAMUSCULAR | Status: AC
  Filled 2023-09-16: qty 20

## 2023-09-16 NOTE — Assessment & Plan Note (Addendum)
 09-16-2023 multifactorial. Likely a combination of bilateral pleural effusion L >R, rapid afib, volume overload, viral pneumonia. Currently on 7 L/min.  09-17-2023 likely due to viral pneumonia, malignant pleural effusion and cancer mets.  09-18-2023 wean O2 to 2 L/min. Will obtain home O2 eval on Monday.  09-19-2023 worsening today. Pt remains full code.  09-20-2023 pt changed to DNR/DNI status by family. Now on 100% NRB and 15 L/min high flow cannula.  08/29/2023 back on bipap

## 2023-09-16 NOTE — Progress Notes (Signed)
 OT Cancellation Note  Patient Details Name: Sara Barnes MRN: [id number] DOB: 03-Nov-1940   Cancelled Treatment:    Reason Eval/Treat Not Completed: Patient at procedure or test/ unavailable Patient is heading down to imaging at this time. OT to continue to follow and check back as schedule will allow.  Wynette Heckler, MS Acute Rehabilitation Department Office# [phone]  09/16/2023, 2:55 PM

## 2023-09-16 NOTE — Assessment & Plan Note (Addendum)
 Prior to 09-16-2023. Pt had rapid afib.  Now off Cardizem  drip and on oral Cardizem  - No hematoma seen on CT.  This was discussed with patient and sister.  Started on Eliquis   09-16-2023 continue cardizem  CD and Eliquis .  09-17-2023 continue cardizem  CD. Holding Eliquis  after this AM dose for pleurx Catheter placement by PCCM. Hopefully on Monday.  09-18-2023 currently off Eliquis  per PCCM. Continue cardizem  CD.  09-19-2023 remains off Eliquis  per PCCM. They will need to restart anticoagulation when they feel it is appropriate. For now, SCDs.  09-20-2023 in rapid afib this AM. I think pt is actively dying. Awaiting family to decide about comfort care measures. Will hold off in IV cardizem  infusion for now.  09/20/2023 back in rapid afib. If family does not change to comfort care, will need to restart IV cardizem  gtts.

## 2023-09-16 NOTE — Assessment & Plan Note (Signed)
 09-16-2023 multifactorial. Likely due to viral pneumonia, hypoxia, hyponatremia. Now resolved. Pt is AxOx4. Pt is very hard of hearing.

## 2023-09-16 NOTE — Progress Notes (Signed)
 The patient experienced epistaxis. The nose bleed stopped about two minutes after constant pressure.

## 2023-09-16 NOTE — Subjective & Objective (Addendum)
 Pt seen and examined. Pt's sister linda at bedside. Pt back up to 8 L/min. Having pain on left side from chest tube.  I spoke to pt's sister Sara Barnes outside of pt's room. Discussed pt's overall very poor prognosis. I discussed with Sara Barnes my concern about how weak pt was and the likelihood that pt only had a few weeks to few months left to live.  Pt still have 550 ml in chest tube output yesterday.

## 2023-09-16 NOTE — Assessment & Plan Note (Addendum)
 09-16-2023 had initial left thoracentesis by PCCM on 09-08-2023 for 800 ml. Bedside thoracic U/S today shows moderate/large left effusion. Estimated [phone]. Pt agreeable to having IR perform another thoracentesis. I think this will help her oxygen requirements.  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 With the effusion returning in just 7 days, this is an extremely poor prognosis. Have contacted pt's oncologist. Pt may be a candidate for Pleurx catheter placement by either PCCM or IR. May need to hold Eliquis . Also will need to talk to PCCM vs IR about their thoughts about placing Pleurx Catheter in a patient that is still a full code given the risk of infection for a long term chest tube.  Pt and her sister have been resistant to talk to palliative care. However, given that her breast cancer has spread to her pleural, this changes her overall prognosis significantly.  Discussed with pt and her sister Sara Barnes that she had malignant left side pleural effusion. Discussed that this means her cancer has spread to the lung or pleural lining. Discussed that she will have continued/recurrent effusion.  Discussed that her overall prognosis was poor given malignant pleural effusion. Discussed management options regarding her pleural effusion.  Have enlisted assistance of PCCM for Pleurx catheter placement.  Will hold eliquis  after this AM dose. PCCM plans on 48 hour eliquis  washout and plan for Pleurx catheter placement on Monday.  Pt's oncologist aware of pt's malignant pleural effusion.  09-18-2023 pt has about 400 ml in chest atrium this AM.  Pt had left side pigtail catheter placed into pleural space yesterday by PCCM in hopes to attempt talc pleurodesis today. If this fails, then pt will get pleurx catheter.   I think pt and her sister have unrealistic expectation on pt's overall prognosis of her metastatic cancer. Oncology following. Pt and sister  resistant to palliative care attempts on goals of care discussion.  Pt is refusing to go to SNF and wants to go home with pt's elderly sister to provide for her care.   09-19-2023 I spoke to pt's sister Sara Barnes outside of pt's room. Discussed pt's overall very poor prognosis. I discussed with Sara Barnes my concern about how weak pt was and the likelihood that pt only had a few weeks to few months left to live. PCCM managing chest tube and pleural effusion. Not sure if talc pleurodesis is going to work.  Pt looks worse today.  09-20-2023  RN states pt did have talc pleurodesis performed by pulmonology yesterday.   Son and pt's sister have changed pt's code status to DNR/DNI.  I think pt is starting active dying phase. Family to decide on comfort care measures. Her CXR from this AM looks terrible. I don't think pt has long to live.  09/05/2023 Met with Pt's sister Sara Barnes and pt's son Sara Barnes at outside her room. Pt is obvious respiratory distress. Wearing bipap with sats in the high 80s. Tachycardic with HR in the 120s.   She is actively dying.  Son wants to talk to PCCM despite my advice that she is actively dying.   Has sent message to Dr. Villa Greaser to see pt and family again.

## 2023-09-16 NOTE — Assessment & Plan Note (Signed)
 Prior to 09-16-2023 - Tested positive for metapneumovirus - Sepsis present on admission - Repeat chest x-ray reviewed independently.  Await formal radiology report, but on my read does seem more fluid overloaded than previous.  Ordered 1 dose IV Lasix  today, also back on IV Solu-Medrol   09-16-2023 resolved. Today is hospital Day #10

## 2023-09-16 NOTE — Assessment & Plan Note (Addendum)
 09-16-2023 chronic  09-18-2023 on prn immodium due to diarrhea.

## 2023-09-16 NOTE — Progress Notes (Signed)
 PROGRESS NOTE    Sara Barnes  ZOX:[id number] DOB: [birth year] DOA: 09/06/2023 PCP: Estill Hemming, DO  Subjective: Pt seen and examined. Met with pt and her sister linda at bedside. Pt was not on supplemental O2 prior to admission.  Pt did not wear Bipap last night. Pt trying to stay awake more.    Bedside thoracic U/S shows moderate/large left sided effusion. Estimated about [phone] ml. Pt with smaller right sided effusion only about 300 ml.  Pt agreeable to another left sided thoracentesis.  Pt currently on 7 L/min O2 via [place].   Hospital Course: HPI: Sara Barnes is a 83 y.o. 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   During discharge from the hospital patient being placed on IV Lovenox  40 mg daily for DVT prophylaxis.       ED Course:  At presentation to ED patient found tachycardic heart rate 162, hypertensive, tachypneic and O2 sat 100% on 8 L oxygen.  Currently requiring BiPAP. ED ED O2 sat dropped to 80% on room air.  Initially placed on 3 L oxygen however requiring more oxygen so patient has been placed on BiPAP.  In the ED patient has been started on Cardizem  drip and IV heparin  drip.   For the management of sepsis patient received 2 L of LR bolus, azithromycin  and ceftriaxone .   Dr. Kermit Ped reported that  even though patient has been pretreated with Benadryl  and   Of note, chart review previous CT abdomen pelvis from 01/16/2023 no evidence of hepatic hematoma anymore stable hepatic and osseous metastatic disease progression.   Given patient has allergic reaction with IV contrast discussed case with PCCM Dr. Charon Copper who recommended to give IV Decadron /Solu-Medrol , continue IV Benadryl  and IV famotidine .  History of allergic reaction/hives with prednisone .  Discussed with PCCM reviewed and clarified that it is not uncommon that patient is allergic to prednisone  however it is safe to give Decadron  or Solu-Medrol . Also unable to give epinephrine  shot in the setting of A-fib RVR currently on Cardizem  drip.   Discussed case with cardiology Dr.Bandarage who recommended continue the Cardizem  drip and heparin  drip.  Cardiology will see patient in the daytime.   Hospitalist has been consulted for further management of allergic reaction with contrast, A-fib RVR, sepsis due to pneumonia, acute metabolic encephalopathy and acute hypoxic respiratory failure.  Significant Events: Admitted 09/06/2023 for respiratory distress   Admission Labs: Na 130, K 2.8, CO2 of 23, BUN 7, Scr 0.8. glu 136 WBC 6.0, HgB 11.0, plt 278 INR 1.0 Lactic 2.1 Covid/RSV/flu negative BNP 531  Admission Imaging Studies: CXR Marked severity left basilar atelectasis and/or infiltrate. 2. Small left pleural effusion CTPA No evidence of pulmonary embolism. 2. Small patchy right apical and anterior right upper lobe infiltrates. 3. Small right pleural effusion with a moderate size left pleural effusion. 4. Small, stable anterior mediastinal lymph node which corresponds to a hypermetabolic focus seen on the prior nuclear medicine PET/CT. 5.  Findings consistent with osseous metastatic disease, as described above. 6. Small hiatal hernia. 7. Aortic atherosclerosis.  Antibiotic Therapy: Anti-infectives (From admission, onward)    Start      Dose/Rate Route Frequency Ordered Stop   09/08/23 0800  Vancomycin  (VANCOCIN ) 1,250 mg in sodium chloride  0.9 % 250 mL IVPB  Status:  Discontinued        1,250 mg 166.7 mL/hr over 90 Minutes Intravenous Every 24 hours 09/07/23 0501 09/08/23 0920   09/07/23 0500  ceFEPIme  (MAXIPIME ) 2 g in sodium chloride  0.9 % 100 mL IVPB  Status:  Discontinued        2 g 200 mL/hr over 30 Minutes Intravenous Every 12 hours 09/07/23 0452 09/09/23 1152   09/07/23 0500  vancomycin  (VANCOREADY) IVPB 1500 mg/300 mL        1,500 mg 150 mL/hr over 120 Minutes Intravenous  Once 09/07/23 0452 09/07/23 1040   09/06/23 2300  cefTRIAXone  (ROCEPHIN ) 1 g in sodium chloride  0.9 % 100 mL IVPB        1 g 200 mL/hr over 30 Minutes Intravenous  Once 09/06/23 2246 09/06/23 2344   09/06/23 2300  azithromycin  (ZITHROMAX ) 500 mg in sodium chloride  0.9 % 250 mL IVPB        500 mg 250 mL/hr over 60 Minutes Intravenous  Once 09/06/23 2246 09/07/23 0044       Procedures: 09-08-2023 thoracentesis  Consultants: Cardiology Palliative care PCCM    Assessment and Plan: * Respiratory distress Prior to 09-16-2023 - Tested positive for metapneumovirus - Sepsis present on admission - Repeat chest x-ray reviewed independently.  Await formal radiology report, but on my read does seem more fluid overloaded than previous.  Ordered 1 dose IV Lasix  today, also back on IV Solu-Medro  09-16-2023 resolved. Respiratory distress  Acute respiratory failure with hypoxia (HCC) 09-16-2023 multifactorial. Likely a combination of bilateral pleural effusion L >R, rapid afib, volume overload, viral pneumonia. Currently on 7 L/min.  Human metapneumovirus (hMPV) pneumonia-resolved as of 09/16/2023 Prior to 09-16-2023 - Tested positive for metapneumovirus - Sepsis present on admission - Repeat chest x-ray reviewed independently.  Await formal radiology report, but on my read does seem more fluid overloaded than previous.  Ordered 1 dose IV Lasix   today, also back on IV Solu-Medrol   09-16-2023 resolved. Today is hospital Day #10  Recurrent left pleural effusion 09-16-2023 had initial left thoracentesis by PCCM on 09-08-2023 for 800 ml. Bedside thoracic U/S today shows moderate/large left effusion. Estimated [phone] ml. Pt agreeable to having IR perform another thoracentesis. I think this will help her oxygen requirements.  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 Pt has been on IV solumedrol, therefore checking cortisol level will not be useful. Echo showed EF 70%. Pt seem volume overloaded. May need to use Samsca to get rid of free water instead of diuretics.     Paroxysmal A-fib (HCC) Prior to 09-16-2023. Pt had rapid afib.  Now off Cardizem  drip and on oral Cardizem  - No hematoma seen on CT.  This was discussed with patient and sister.  Started on Eliquis   09-16-2023 continue cardizem  CD and Eliquis .  Acute metabolic encephalopathy 09-16-2023 multifactorial. Likely due to viral pneumonia, hypoxia, hyponatremia. Now resolved. Pt is AxOx4. Pt is very hard of hearing.  Cancer of lower-inner quadrant of left female breast [facility]) 09-16-2023 chronic  History of Crohn's disease 09-16-2023 chronic  Allergic  reaction to contrast-resolved as of 09/16/2023 On admission.  - In the ED patient has been pretreated with Benadryl  and famotidine  and already received Solu-Medrol  125 mg before coming to the ED still after contrast exposure patient developed itchiness and wheezing.  Received second dose of IV Solu-Medrol , Benadryl  and famotidine  in the ED.   -After discussion with PCCM recommended to continue the BiPAP, continue IV Solu-Medrol  or Decadron , IV Benadryl  and IV famotidine  as scheduled. Unable to give epinephrine  shot given patient has A-fib RVR. -Continue IV Solu-Medrol  40 mg twice daily, iv  Benadryl  25 mg 3 times daily and iv famotidine  20 mg twice daily. -Continue ipratropium every 6 hours scheduled.  Avoid albuterol  and levalbuterol  as it provokes A-fib RVR. -Per discussion with patient's sister at the bedside patient is full code  09-16-2023 resolved   DVT prophylaxis: SCDs Start: 09/07/23 0451 Place TED hose Start: 09/07/23 0451 apixaban  (ELIQUIS ) tablet 5 mg     Code Status: Full Code Family Communication: discussed with pt and pt's sister linda at bedside Disposition Plan: return home. Sister does not want pt to go to SNF. Reason for continuing need for hospitalization: getting another thoracentesis today(left side)  Objective: Vitals:   09/16/23 1211 09/16/23 1515 09/16/23 1520 09/16/23 1530  BP: 115/66 127/72 129/69 (!) 140/76  Pulse: (!) 101     Resp: 20     Temp: 98.1 F (36.7 C)     TempSrc: Oral     SpO2: 98%     Weight:      Height:        Intake/Output Summary (Last 24 hours) at 09/16/2023 1550 Last data filed at 09/16/2023 1000 Gross per 24 hour  Intake 660 ml  Output 900 ml  Net -240 ml   Filed Weights   09/06/23 2227  Weight: 78.9 kg    Examination:  Physical Exam Vitals and nursing note reviewed.  Constitutional:      General: She is not in acute distress.    Appearance: She is not toxic-appearing.     Comments: Frail and weak  HENT:     Head: Normocephalic and atraumatic.  Cardiovascular:     Rate and Rhythm: Normal rate and regular rhythm.  Pulmonary:     Effort: Pulmonary effort is normal.     Comments: Bedside thoracic U/S shows bilateral pleural effusion L >> R. Left side estimate [phone] ml. Abdominal:     General: Bowel sounds are normal.     Palpations: Abdomen is soft.  Musculoskeletal:     Right lower leg: No edema.     Left lower leg: No edema.  Skin:    General: Skin is warm and dry.     Capillary Refill: Capillary refill takes less than 2 seconds.  Neurological:     General: No focal deficit present.      Mental Status: She is alert and oriented to person, place, and time.     Data Reviewed: I have personally reviewed following labs and imaging studies  CBC: Recent Labs  Lab 09/12/23 0410 09/13/23 0500 09/14/23 0336 09/15/23 0436 09/16/23 0326  WBC 14.0* 12.3* 13.9* 15.4* 16.8*  HGB 9.2* 9.4* 9.4* 10.0* 10.9*  HCT 28.9* 29.0* 29.8* 31.9* 33.6*  MCV 95.1 94.2 95.8 95.2 93.1  PLT 220 239 238 270 293   Basic Metabolic Panel: Recent Labs  Lab 09/12/23 0410 09/13/23 0500 09/14/23 0336 09/15/23 0436 09/16/23 0326  NA 127*  127* 127* 127* 130* 129*  K 4.3 4.0 4.5 4.3  4.0  CL 93* 92* 94* 97* 95*  CO2 27 23 24 25 26   GLUCOSE 131* 198* 117* 97 128*  BUN 29* 28* 28* 22 21  CREATININE 0.89 0.74 0.62 0.53 0.45  CALCIUM  8.4* 8.3* 8.1* 8.0* 8.1*   GFR: Estimated Creatinine Clearance: 58.4 mL/min (by C-G formula based on SCr of 0.45 mg/dL). BNP (last 3 results) Recent Labs    09/07/23 0532  BNP 531.6*   CBG: Recent Labs  Lab 09/15/23 0011 09/15/23 0604 09/15/23 1154 09/15/23 1749 09/16/23 0022  GLUCAP 142* 94 113* 143* 166*   Sepsis Labs: Recent Labs  Lab 09/11/23 0345 09/12/23 0410  PROCALCITON 0.12 <0.10    Recent Results (from the past 240 hours)  Blood Culture (routine x 2)     Status: None   Collection Time: 09/06/23 10:38 PM   Specimen: BLOOD  Result Value Ref Range Status   Specimen Description   Final    BLOOD SITE NOT SPECIFIED Performed at [facility], [address] [place], [place] [address]    Special Requests   Final    BOTTLES DRAWN AEROBIC AND ANAEROBIC Blood Culture results may not be optimal due to an inadequate volume of blood received in culture bottles Performed at [facility], [address] [place], [place] [address]    Culture   Final    NO GROWTH 5 DAYS Performed at [facility], [address] [place], [place] [address]    Report Status 09/12/2023 FINAL  Final  Resp panel by  RT-PCR (RSV, Flu A&B, Covid) Anterior Nasal Swab     Status: None   Collection Time: 09/06/23 11:16 PM   Specimen: Anterior Nasal Swab  Result Value Ref Range Status   SARS Coronavirus 2 by RT PCR NEGATIVE NEGATIVE Final    Comment: (NOTE) SARS-CoV-2 target nucleic acids are NOT DETECTED.  The SARS-CoV-2 RNA is generally detectable in upper respiratory specimens during the acute phase of infection. The lowest concentration of SARS-CoV-2 viral copies this assay can detect is 138 copies/mL. A negative result does not preclude SARS-Cov-2 infection and should not be used as the sole basis for treatment or other patient management decisions. A negative result may occur with  improper specimen collection/handling, submission of specimen other than nasopharyngeal swab, presence of viral mutation(s) within the areas targeted by this assay, and inadequate number of viral copies(<138 copies/mL). A negative result must be combined with clinical observations, patient history, and epidemiological information. The expected result is Negative.  Fact Sheet for Patients:  BloggerCourse.com  Fact Sheet for Healthcare Providers:  SeriousBroker.it  This test is no t yet approved or cleared by the [place]  FDA and  has been authorized for detection and/or diagnosis of SARS-CoV-2 by FDA under an Emergency Use Authorization (EUA). This EUA will remain  in effect (meaning this test can be used) for the duration of the COVID-19 declaration under Section 564(b)(1) of the Act, 21 U.S.C.section 360bbb-3(b)(1), unless the authorization is terminated  or revoked sooner.       Influenza A by PCR NEGATIVE NEGATIVE Final   Influenza B by PCR NEGATIVE NEGATIVE Final    Comment: (NOTE) The Xpert Xpress SARS-CoV-2/FLU/RSV plus assay is intended as an aid in the diagnosis of influenza from Nasopharyngeal swab specimens and should not be used as a sole basis  for treatment. Nasal washings and aspirates are unacceptable for Xpert Xpress SARS-CoV-2/FLU/RSV testing.  Fact Sheet for Patients: BloggerCourse.com  Fact Sheet for  Healthcare Providers: SeriousBroker.it  This test is not yet approved or cleared by the [place]  FDA and has been authorized for detection and/or diagnosis of SARS-CoV-2 by FDA under an Emergency Use Authorization (EUA). This EUA will remain in effect (meaning this test can be used) for the duration of the COVID-19 declaration under Section 564(b)(1) of the Act, 21 U.S.C. section 360bbb-3(b)(1), unless the authorization is terminated or revoked.     Resp Syncytial Virus by PCR NEGATIVE NEGATIVE Final    Comment: (NOTE) Fact Sheet for Patients: BloggerCourse.com  Fact Sheet for Healthcare Providers: SeriousBroker.it  This test is not yet approved or cleared by the [place]  FDA and has been authorized for detection and/or diagnosis of SARS-CoV-2 by FDA under an Emergency Use Authorization (EUA). This EUA will remain in effect (meaning this test can be used) for the duration of the COVID-19 declaration under Section 564(b)(1) of the Act, 21 U.S.C. section 360bbb-3(b)(1), unless the authorization is terminated or revoked.  Performed at [facility], [address] [place], [place] [address]   Blood Culture (routine x 2)     Status: None   Collection Time: 09/06/23 11:27 PM   Specimen: BLOOD RIGHT ARM  Result Value Ref Range Status   Specimen Description   Final    BLOOD RIGHT ARM Performed at [facility], [address], [place], [place] [address]    Special Requests   Final    BOTTLES DRAWN AEROBIC ONLY Blood Culture adequate volume Performed at [facility], [address] [place], [place] [address]    Culture   Final    NO GROWTH 5 DAYS Performed at  [facility], [address] [place], [place] [address]    Report Status 09/12/2023 FINAL  Final  MRSA Next Gen by PCR, Nasal     Status: None   Collection Time: 09/07/23 12:30 PM   Specimen: Nasal Mucosa; Nasal Swab  Result Value Ref Range Status   MRSA by PCR Next Gen NOT DETECTED NOT DETECTED Final    Comment: (NOTE) The GeneXpert MRSA Assay (FDA approved for NASAL specimens only), is one component of a comprehensive MRSA colonization surveillance program. It is not intended to diagnose MRSA infection nor to guide or monitor treatment for MRSA infections. Test performance is not FDA approved in patients less than 46 years old. Performed at [facility], [address], [place], [place] [address]   Respiratory (~20 pathogens) panel by PCR     Status: Abnormal   Collection Time: 09/08/23  3:49 PM   Specimen: Nasopharyngeal Swab; Respiratory  Result Value Ref Range Status   Adenovirus NOT DETECTED NOT DETECTED Final   Coronavirus 229E NOT DETECTED NOT DETECTED Final    Comment: (NOTE) The Coronavirus on the Respiratory Panel, DOES NOT test for the novel  Coronavirus (2019 nCoV)    Coronavirus HKU1 NOT DETECTED NOT DETECTED Final   Coronavirus NL63 NOT DETECTED NOT DETECTED Final   Coronavirus OC43 NOT DETECTED NOT DETECTED Final   Metapneumovirus DETECTED (A) NOT DETECTED Final   Rhinovirus / Enterovirus NOT DETECTED NOT DETECTED Final   Influenza A NOT DETECTED NOT DETECTED Final   Influenza B NOT DETECTED NOT DETECTED Final   Parainfluenza Virus 1 NOT DETECTED NOT DETECTED Final   Parainfluenza Virus 2 NOT DETECTED NOT DETECTED Final   Parainfluenza Virus 3 NOT DETECTED NOT DETECTED Final   Parainfluenza Virus 4 NOT DETECTED NOT DETECTED Final  Respiratory Syncytial Virus NOT DETECTED NOT DETECTED Final   Bordetella pertussis NOT DETECTED NOT DETECTED Final   Bordetella Parapertussis NOT DETECTED NOT DETECTED Final   Chlamydophila pneumoniae NOT  DETECTED NOT DETECTED Final   Mycoplasma pneumoniae NOT DETECTED NOT DETECTED Final    Comment: Performed at [facility], [address] [place], [place] [address]  Body fluid culture w Gram Stain     Status: None   Collection Time: 09/08/23  4:00 PM   Specimen: Thoracentesis; Body Fluid  Result Value Ref Range Status   Specimen Description   Final    THORACENTESIS Performed at [facility] [place] [facility], [address], [place], [place] [address]    Special Requests   Final    NONE Performed at [facility], [address] [place], [place] [address]    Gram Stain NO WBC SEEN NO ORGANISMS SEEN   Final   Culture   Final    NO GROWTH 3 DAYS Performed at [facility], [address] [place], [place] [address]    Report Status 09/12/2023 FINAL  Final     Radiology Studies: DG CHEST PORT 1 VIEW Result Date: 09/16/2023 CLINICAL DATA:  Respiratory failure. EXAM: PORTABLE CHEST 1 VIEW COMPARISON:  Sep 15, 2023. FINDINGS: Stable cardiomediastinal silhouette. Right-sided PICC line is unchanged. Stable bilateral lung opacities are noted most consistent with pneumonia or possibly edema. Small bilateral pleural effusions are noted. Bony thorax is unremarkable. IMPRESSION: Stable bilateral lung opacities and small pleural effusions as noted above. Electronically Signed   By: Rosalene Colon M.D.   On: 09/16/2023 11:33   DG CHEST PORT 1 VIEW Result Date: 09/15/2023 CLINICAL DATA:  Dyspnea EXAM: PORTABLE CHEST 1 VIEW COMPARISON:  09/08/2023 FINDINGS: There is a right arm PICC line with tip in the expected location of the right atrium. Stable cardiomediastinal contours. Aortic atherosclerosis. Increased volume of left pleural effusion. Small right pleural effusion unchanged. Progressive bilateral increased interstitial and airspace opacities are identified. Compared with the previous exam there is been worsening aeration to both upper lung zones, right greater  than left. IMPRESSION: 1. Progressive bilateral increased interstitial and airspace opacities compatible with worsening aeration to both upper lung zones. 2. Increased volume of left pleural effusion. 3. Stable small right pleural effusion. Electronically Signed   By: Kimberley Penman M.D.   On: 09/15/2023 11:39    Scheduled Meds:  apixaban   5 mg Oral BID   arformoterol   15 mcg Nebulization BID   Chlorhexidine  Gluconate Cloth  6 each Topical Daily   vitamin D3  1,000 Units Oral Daily   diltiazem   240 mg Oral Daily   FLUoxetine   20 mg Oral Daily   fluticasone   2 spray Each Nare Daily   guaiFENesin   600 mg Oral BID   nystatin   5 mL Oral QID   revefenacin   175 mcg Nebulization Daily   sodium chloride  flush  10-40 mL Intracatheter Q12H   sodium chloride  flush  3 mL Intravenous Q12H   sodium chloride   1 g Oral TID WC   Continuous Infusions:   LOS: 9 days   Time spent: 60 minutes  Unk Garb, DO  [facility]  09/16/2023, 3:50 PM

## 2023-09-16 NOTE — Assessment & Plan Note (Signed)
 Prior to 09-16-2023 - Tested positive for metapneumovirus - Sepsis present on admission - Repeat chest x-ray reviewed independently.  Await formal radiology report, but on my read does seem more fluid overloaded than previous.  Ordered 1 dose IV Lasix  today, also back on IV Solu-Medro  09-16-2023 resolved. Respiratory distress

## 2023-09-16 NOTE — Assessment & Plan Note (Signed)
 09-16-2023 chronic

## 2023-09-16 NOTE — Assessment & Plan Note (Addendum)
 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 Pt has been on IV solumedrol, therefore checking cortisol level will not be useful. Echo showed EF 70%. Pt seem volume overloaded. May need to use Samsca to get rid of free water instead of diuretics.  09-17-2023 will cancel workup of hyponatremia given pt's malignant pleural effusion. Her overall prognosis is quite poor.  09-19-2023 Na 127.

## 2023-09-16 NOTE — Progress Notes (Signed)
 The patient returned from procedure. She has a band aid to left back. Site is clean dry and intact.   09/16/23 1624  Vitals  Temp 98.3 F (36.8 C)  Temp Source Oral  BP (!) 136/98  MAP (mmHg) 109  BP Location Right Arm  BP Method Automatic  Patient Position (if appropriate) Lying  Pulse Rate 91  Pulse Rate Source Monitor  Resp 20  MEWS COLOR  MEWS Score Color Green  Oxygen Therapy  SpO2 99 %  O2 Device HFNC  MEWS Score  MEWS Temp 0  MEWS Systolic 0  MEWS Pulse 0  MEWS RR 0  MEWS LOC 0  MEWS Score 0

## 2023-09-16 NOTE — Assessment & Plan Note (Signed)
 On admission.  - In the ED patient has been pretreated with Benadryl  and famotidine  and already received Solu-Medrol  125 mg before coming to the ED still after contrast exposure patient developed itchiness and wheezing.  Received second dose of IV Solu-Medrol , Benadryl  and famotidine  in the ED.   -After discussion with PCCM recommended to continue the BiPAP, continue IV Solu-Medrol  or Decadron , IV Benadryl  and IV famotidine  as scheduled. Unable to give epinephrine  shot given patient has A-fib RVR. -Continue IV Solu-Medrol  40 mg twice daily, iv Benadryl  25 mg 3 times daily and iv famotidine  20 mg twice daily. -Continue ipratropium every 6 hours scheduled.  Avoid albuterol  and levalbuterol  as it provokes A-fib RVR. -Per discussion with patient's sister at the bedside patient is full code  09-16-2023 resolved

## 2023-09-17 ENCOUNTER — Telehealth: Payer: Self-pay

## 2023-09-17 ENCOUNTER — Other Ambulatory Visit: Payer: Self-pay

## 2023-09-17 ENCOUNTER — Inpatient Hospital Stay (HOSPITAL_COMMUNITY)

## 2023-09-17 DIAGNOSIS — R0603 Acute respiratory distress: Secondary | ICD-10-CM | POA: Diagnosis not present

## 2023-09-17 DIAGNOSIS — J9601 Acute respiratory failure with hypoxia: Secondary | ICD-10-CM | POA: Diagnosis not present

## 2023-09-17 DIAGNOSIS — J910 Malignant pleural effusion: Secondary | ICD-10-CM | POA: Diagnosis not present

## 2023-09-17 DIAGNOSIS — E871 Hypo-osmolality and hyponatremia: Secondary | ICD-10-CM | POA: Diagnosis not present

## 2023-09-17 DIAGNOSIS — C50912 Malignant neoplasm of unspecified site of left female breast: Secondary | ICD-10-CM | POA: Diagnosis not present

## 2023-09-17 DIAGNOSIS — J90 Pleural effusion, not elsewhere classified: Secondary | ICD-10-CM | POA: Diagnosis not present

## 2023-09-17 LAB — GLUCOSE, CAPILLARY
Glucose-Capillary: 111 mg/dL — ABNORMAL HIGH (ref 70–99)
Glucose-Capillary: 114 mg/dL — ABNORMAL HIGH (ref 70–99)
Glucose-Capillary: 144 mg/dL — ABNORMAL HIGH (ref 70–99)
Glucose-Capillary: 149 mg/dL — ABNORMAL HIGH (ref 70–99)

## 2023-09-17 MED ORDER — APIXABAN 5 MG PO TABS
5.0000 mg | ORAL_TABLET | Freq: Two times a day (BID) | ORAL | Status: AC
  Administered 2023-09-17: 5 mg via ORAL
  Filled 2023-09-17: qty 1

## 2023-09-17 MED ORDER — SODIUM CHLORIDE 0.9% FLUSH
10.0000 mL | Freq: Three times a day (TID) | INTRAVENOUS | Status: DC
Start: 2023-09-17 — End: 2023-09-20
  Administered 2023-09-17 – 2023-09-20 (×6): 10 mL via INTRAPLEURAL

## 2023-09-17 MED ORDER — OXYCODONE HCL 5 MG PO TABS
10.0000 mg | ORAL_TABLET | Freq: Once | ORAL | Status: AC
  Administered 2023-09-17: 10 mg via ORAL
  Filled 2023-09-17: qty 2

## 2023-09-17 MED ORDER — APIXABAN 5 MG PO TABS
5.0000 mg | ORAL_TABLET | Freq: Two times a day (BID) | ORAL | Status: DC
Start: 2023-09-17 — End: 2023-09-17

## 2023-09-17 MED ORDER — MIDAZOLAM HCL 2 MG/2ML IJ SOLN
0.5000 mg | INTRAMUSCULAR | Status: AC | PRN
  Administered 2023-09-17: 2 mg via INTRAVENOUS
  Filled 2023-09-17: qty 2

## 2023-09-17 MED ORDER — DIPHENOXYLATE-ATROPINE 2.5-0.025 MG PO TABS
1.0000 | ORAL_TABLET | Freq: Three times a day (TID) | ORAL | Status: DC | PRN
Start: 2023-09-17 — End: 2023-09-22
  Administered 2023-09-17 (×2): 1 via ORAL
  Filled 2023-09-17 (×2): qty 1

## 2023-09-17 NOTE — Progress Notes (Signed)
 OT Cancellation Note  Patient Details Name: Sara Barnes MRN: [id number] DOB: 1940-10-19   Cancelled Treatment:    Reason Eval/Treat Not Completed: Medical issues which prohibited therapy. Pt currently in 10/10 pain per EMR, HR up to 120s at rest. Discussed with RN - OT will hold therapy at this time. Will continue to follow and re-attempt as able.   Kabrea Seeney L. Ovila Lepage, OTR/L  09/17/23, 10:49 AM

## 2023-09-17 NOTE — Assessment & Plan Note (Addendum)
 09-17-2023 has likely spread to her lungs/pleura.  09-18-2023 I think pt and her sister have unrealistic expectation on pt's overall prognosis of her metastatic cancer. Oncology following. Pt and sister resistant to palliative care attempts on goals of care discussion. Pt is refusing to go to SNF and wants to go home with pt's elderly sister to provide for her care.   09-19-2023 I spoke to pt's sister [name] outside of pt's room. Discussed pt's overall very poor prognosis. I discussed with [name] my concern about how weak pt was and the likelihood that pt only had a few weeks to few months left to live. PCCM managing chest tube and pleural effusion. Not sure if talc pleurodesis is going to work.  Pt looks worse today.  Pt remains a full code.  10/11/2023 son and pt's sister aware of pt's terminal prognosis. I think pt is actively dying given her increased O2 requirements, rapid afib, etc. Pt's code status change to DNR/DNI. Family to decide on comfort care measures today.  09/09/2023 family decided on comfort care measures. Pt placed on IV morphine  gtts. Removed from bipap.  Pt died at 10-18-2055 on Oct 11, 2023. Family was present. Pt is not suitable for organ donation.

## 2023-09-17 NOTE — Progress Notes (Signed)
 NAME:  Sara Barnes, MRN:  [id number], DOB:  Dec 06, 1940, LOS: 10 ADMISSION DATE:  09/06/2023, CONSULTATION DATE:  5/13 REFERRING MD:  Jenell Mirza, CHIEF COMPLAINT:  wheezing, respiratory failure   History of Present Illness:  Sara Barnes is a 83 y.o. woman with a history of metastatic breast cancer who was admitted to the hospital with acute respiratory failure on 5/12 after developing shortness of breath, productive cough, wheezing on 5/11.  She had a fall with hip fracture in April and has been at rehab since then, making good progress.  At baseline for the last 2 years she has had activity limitation, but it is unclear if this is more due to fatigue or dyspnea on exertion.  This had been at baseline until her symptoms began acutely 2 days ago.  En route to the hospital on BiPAP she developed A-fib with RVR after receiving a breathing treatment.  She is out of A-fib today and remains off BiPAP.  She had an episode of worse shortness of breath this morning.  Family requesting pulmonology consult for wheezing.  No PTA inhalers.  No history of tobacco abuse.  Has a history of thoracentesis in January 2023> atypical cells but not confirmed malignant.  She reports that her symptoms now feel like when she had her thoracentesis in 2023.  No chronic antiplatelet or anticoagulant medications.  History is obtained with help of her sister due to patient being very hard of hearing.  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 Hip fracture 07/2023 Crohn's  Significant Hospital Events: Including procedures, antibiotic start and stop dates in addition to other pertinent events   5/12 admitted, started on azithromycin  and ceftriaxone  5/13 left thoracentesis with 800 cc clear yellow fluid removed 5/14 issues with pain and confusion overnight 5/22 called back to consider pleurX  Interim History / Subjective:  Seen and examined Another thora done 5/21  Objective    Blood  pressure 119/75, pulse 94, temperature 98.1 F (36.7 C), temperature source Oral, resp. rate 20, height 5\' 6"  (1.676 m), weight 78.9 kg, SpO2 96%.        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  She does benefit from thoracenteses.  Discussed with primary and Dr. Maryalice Smaller who think intervention may be helpful from at least a palliative standpoint.  Discussed following options with patient: 1- scheduled thoras q1-2 weeks 2- pigtail and talc attempt 3- pleurX and talc (requires eliquis  washout)  Given the relative care burdens, effectiveness, and risk profiles of each intervention we decided on following:  Hold eliquis   Pleural pigtail today, let drain overnight and talc tomorrow if get good pleural apposition.  After talc injection monitor output; if drops off completely remove pigtail and outpatient monitoring.  If after talc ongoing heavy output; remove pigtail and plan for pleurX next week provided she and caregivers can handle the drainage at home (there is chance she may qualify for home nursing for  another reason and be able to have them come, will ask TOC although it looks like she may be going to SNF so moot point)  I also think ongoing palliative care talks are reasonable.  Will follow.  Remainder of care per primary  09/17/2023, 10:54 AM

## 2023-09-17 NOTE — Progress Notes (Signed)
 PT Cancellation Note  Patient Details Name: Sara Barnes MRN: [id number] DOB: [birth year]   Cancelled Treatment:     insertion of chest Tube today.  Will attempt to see tomorrow.  Pt has been evaluated with a rec for SNF.  Bess Broody  PTA Acute  Rehabilitation Services Office M-F          [phone]

## 2023-09-17 NOTE — Progress Notes (Addendum)
 Sara Barnes   DOB:14-May-1940   KG#:[id number]   GUY#:[id number]  Medical oncology follow-up note  Subjective: Patient had a chest tube placed for left pleural effusion by pulmonary today, she is still on nasal cannula 7l/MIN. she does not want go to nursing home, she plans to go home and her sister Sara Barnes will help her at home.  Objective:  Vitals:   09/17/23 1445 09/17/23 1450  BP: 122/60 123/70  Pulse: 80 82  Resp:    Temp:    SpO2: 92% 94%    Body mass index is 28.08 kg/m.  Intake/Output Summary (Last 24 hours) at 09/17/2023 1706 Last data filed at 09/17/2023 1003 Gross per 24 hour  Intake 360 ml  Output 650 ml  Net -290 ml     Sclerae unicteric  (+) Chest tube in the left left side  Neuro nonfocal    CBG (last 3)  Recent Labs    09/17/23 0030 09/17/23 0630 09/17/23 1216  GLUCAP 149* 111* 114*     Labs:  Lab Results  Component Value Date   WBC 16.8 (H) 09/16/2023   HGB 10.9 (L) 09/16/2023   HCT 33.6 (L) 09/16/2023   MCV 93.1 09/16/2023   PLT 293 09/16/2023   NEUTROABS 3.7 09/06/2023     Urine Studies No results for input(s): "UHGB", "CRYS" in the last 72 hours.  Invalid input(s): "UACOL", "UAPR", "USPG", "UPH", "UTP", "UGL", "UKET", "UBIL", "UNIT", "UROB", "ULEU", "UEPI", "UWBC", "URBC", "UBAC", "CAST", "UCOM", "BILUA"  Basic Metabolic Panel: Recent Labs  Lab 09/12/23 0410 09/13/23 0500 09/14/23 0336 09/15/23 0436 09/16/23 0326  NA 127*  127* 127* 127* 130* 129*  K 4.3 4.0 4.5 4.3 4.0  CL 93* 92* 94* 97* 95*  CO2 27 23 24 25 26   GLUCOSE 131* 198* 117* 97 128*  BUN 29* 28* 28* 22 21  CREATININE 0.89 0.74 0.62 0.53 0.45  CALCIUM  8.4* 8.3* 8.1* 8.0* 8.1*   GFR Estimated Creatinine Clearance: 58.4 mL/min (by C-G formula based on SCr of 0.45 mg/dL). Liver Function Tests: No results for input(s): "AST", "ALT", "ALKPHOS", "BILITOT", "PROT", "ALBUMIN " in the last 168 hours.  No results for input(s): "LIPASE", "AMYLASE" in the last 168 hours. No  results for input(s): "AMMONIA" in the last 168 hours. Coagulation profile No results for input(s): "INR", "PROTIME" in the last 168 hours.   CBC: Recent Labs  Lab 09/12/23 0410 09/13/23 0500 09/14/23 0336 09/15/23 0436 09/16/23 0326  WBC 14.0* 12.3* 13.9* 15.4* 16.8*  HGB 9.2* 9.4* 9.4* 10.0* 10.9*  HCT 28.9* 29.0* 29.8* 31.9* 33.6*  MCV 95.1 94.2 95.8 95.2 93.1  PLT 220 239 238 270 293   Cardiac Enzymes: No results for input(s): "CKTOTAL", "CKMB", "CKMBINDEX", "TROPONINI" in the last 168 hours. BNP: Invalid input(s): "POCBNP" CBG: Recent Labs  Lab 09/16/23 0022 09/16/23 1625 09/17/23 0030 09/17/23 0630 09/17/23 1216  GLUCAP 166* 152* 149* 111* 114*   D-Dimer No results for input(s): "DDIMER" in the last 72 hours. Hgb A1c No results for input(s): "HGBA1C" in the last 72 hours. Lipid Profile No results for input(s): "CHOL", "HDL", "LDLCALC", "TRIG", "CHOLHDL", "LDLDIRECT" in the last 72 hours. Thyroid  function studies No results for input(s): "TSH", "T4TOTAL", "T3FREE", "THYROIDAB" in the last 72 hours.  Invalid input(s): "FREET3" Anemia work up No results for input(s): "VITAMINB12", "FOLATE", "FERRITIN", "TIBC", "IRON", "RETICCTPCT" in the last 72 hours. Microbiology Recent Results (from the past 240 hours)  Respiratory (~20 pathogens) panel by PCR     Status: Abnormal  Collection Time: 09/08/23  3:49 PM   Specimen: Nasopharyngeal Swab; Respiratory  Result Value Ref Range Status   Adenovirus NOT DETECTED NOT DETECTED Final   Coronavirus 229E NOT DETECTED NOT DETECTED Final    Comment: (NOTE) The Coronavirus on the Respiratory Panel, DOES NOT test for the novel  Coronavirus (2019 nCoV)    Coronavirus HKU1 NOT DETECTED NOT DETECTED Final   Coronavirus NL63 NOT DETECTED NOT DETECTED Final   Coronavirus OC43 NOT DETECTED NOT DETECTED Final   Metapneumovirus DETECTED (A) NOT DETECTED Final   Rhinovirus / Enterovirus NOT DETECTED NOT DETECTED Final    Influenza A NOT DETECTED NOT DETECTED Final   Influenza B NOT DETECTED NOT DETECTED Final   Parainfluenza Virus 1 NOT DETECTED NOT DETECTED Final   Parainfluenza Virus 2 NOT DETECTED NOT DETECTED Final   Parainfluenza Virus 3 NOT DETECTED NOT DETECTED Final   Parainfluenza Virus 4 NOT DETECTED NOT DETECTED Final   Respiratory Syncytial Virus NOT DETECTED NOT DETECTED Final   Bordetella pertussis NOT DETECTED NOT DETECTED Final   Bordetella Parapertussis NOT DETECTED NOT DETECTED Final   Chlamydophila pneumoniae NOT DETECTED NOT DETECTED Final   Mycoplasma pneumoniae NOT DETECTED NOT DETECTED Final    Comment: Performed at [facility], [address], [place], [place] [address]  Body fluid culture w Gram Stain     Status: None   Collection Time: 09/08/23  4:00 PM   Specimen: Thoracentesis; Body Fluid  Result Value Ref Range Status   Specimen Description   Final    THORACENTESIS Performed at [facility], [address], [place], [place] [address]    Special Requests   Final    NONE Performed at [facility], [address] [place], [place] [address]    Gram Stain NO WBC SEEN NO ORGANISMS SEEN   Final   Culture   Final    NO GROWTH 3 DAYS Performed at [facility], [address] [place], [place] [address]    Report Status 09/12/2023 FINAL  Final  Body fluid culture w Gram Stain     Status: None (Preliminary result)   Collection Time: 09/16/23  3:34 PM   Specimen: PATH Cytology Pleural fluid  Result Value Ref Range Status   Specimen Description   Final    PLEURAL Performed at [facility], [address], [place], [place] [address]    Special Requests   Final    NONE Performed at [facility], [address] [place], [place] [address]    Gram Stain   Final    FEW WBC PRESENT, PREDOMINANTLY MONONUCLEAR NO ORGANISMS SEEN    Culture   Final    NO GROWTH < 24 HOURS Performed at [facility], [address], [place], [place] [address]    Report Status PENDING  Incomplete      Studies:  DG Chest Port 1 View Result Date: 09/17/2023 CLINICAL DATA:  Chest tube EXAM: PORTABLE CHEST 1 VIEW COMPARISON:  09/16/2023, 09/15/2023, 09/08/2023 FINDINGS: Right upper extremity central venous catheter tip over the cavoatrial region. New left-sided chest drainage catheter projects over the medial lower left chest. Decreased left effusion. Enlarged cardiomediastinal silhouette. Small right pleural effusion. Widespread heterogeneous bilateral airspace disease appears slightly worse. No pneumothorax. IMPRESSION: 1. New left-sided chest drainage catheter with decreased left effusion. No pneumothorax. 2. Widespread heterogeneous bilateral airspace disease appears slightly worse. Small right pleural effusion. Electronically Signed  By: Esmeralda Hedge M.D.   On: 09/17/2023 16:53   DG Chest Port 1 View Result Date: 09/16/2023 CLINICAL DATA:  Left pleural effusion EXAM: PORTABLE CHEST 1 VIEW COMPARISON:  CT of the chest 09/07/2023.  Chest x-ray 09/16/2023. FINDINGS: The heart is mildly enlarged, unchanged. Right-sided central venous catheter tip projects over the distal SVC. Bilateral multifocal airspace disease is again noted, not significantly changed. There is a small left pleural effusion which has slightly decreased. No pneumothorax. Osseous structures are stable. There surgical clips in the left chest wall. IMPRESSION: 1. Stable bilateral multifocal airspace disease. 2. Mild decrease in small left pleural effusion. Electronically Signed   By: Tyron Gallon M.D.   On: 09/16/2023 16:56   US  THORACENTESIS ASP PLEURAL SPACE W/IMG GUIDE Result Date: 09/16/2023 INDICATION: Shortness of breath. Left-sided pleural effusion. Request for diagnostic and therapeutic thoracentesis. EXAM: ULTRASOUND GUIDED LEFT THORACENTESIS MEDICATIONS: 1% plain lidocaine , 5 mL COMPLICATIONS: None immediate. PROCEDURE: An  ultrasound guided thoracentesis was thoroughly discussed with the patient and questions answered. The benefits, risks, alternatives and complications were also discussed. The patient understands and wishes to proceed with the procedure. Written consent was obtained. Ultrasound was performed to localize and mark an adequate pocket of fluid in the left chest. The area was then prepped and draped in the normal sterile fashion. 1% Lidocaine  was used for local anesthesia. Under ultrasound guidance a 6 Fr Safe-T-Centesis catheter was introduced. Thoracentesis was performed. The catheter was removed and a dressing applied. FINDINGS: A total of approximately 600 mL of clear, amber colored fluid was removed. Samples were sent to the laboratory as requested by the clinical team. IMPRESSION: Successful ultrasound guided left thoracentesis yielding 600 mL of pleural fluid. Procedure performed by Prudence Brown PA-C and supervised by Dr. Alyssa Jumper Electronically Signed   By: Melven Stable.  Shick M.D.   On: 09/16/2023 16:29   DG CHEST PORT 1 VIEW Result Date: 09/16/2023 CLINICAL DATA:  Respiratory failure. EXAM: PORTABLE CHEST 1 VIEW COMPARISON:  Sep 15, 2023. FINDINGS: Stable cardiomediastinal silhouette. Right-sided PICC line is unchanged. Stable bilateral lung opacities are noted most consistent with pneumonia or possibly edema. Small bilateral pleural effusions are noted. Bony thorax is unremarkable. IMPRESSION: Stable bilateral lung opacities and small pleural effusions as noted above. Electronically Signed   By: Rosalene Colon M.D.   On: 09/16/2023 11:33    Assessment: 84 y.o. 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 Crohn's disease   Plan:  -  I personally reviewed her CT chest from Sep 07, 2023 and CT abdomen pelvis from Sep 11, 2023, which showed worsening left pleural effusion, and new nodule in the right upper lung, and a new lesion in the liver.  Her cytology from left thoracentesis was positive for malignant cells.  This is consistent with cancer progression. - I agree with Pleurx to manage her left pleural effusion which will likely recur.  Pulmonary team has placed a chest tube today, and possible pleurodesis tomorrow. - I discussed the above finding with patient and her Sister Sara Barnes.  If she is able to recover well to live independently, and able to follow-up in my office, I would consider change treatment to anastrozole and Ibrance.  This is not something urgent, does not need to be  started in the hospital.  Hopefully she will improve with supportive care.  -Patient is not ready for hospice. - Patient does not want to go to nursing home, she wants to be discharged home, her sister Sara Barnes will live with her, and she will have a care giver at home also. -I will f/u next Monday if she is doing hospital.  I spent a total of 50 minutes for her visit today, including care coordination.   Sonja [place], MD 09/17/2023  5:06 PM

## 2023-09-17 NOTE — Telephone Encounter (Signed)
 Pt sister called stating pt is currently admitted in the hospital for pneumonia and they want to place a chest tube.  Pt is requesting if Dr. Maryalice Smaller could come by to see pt today or tomorrow.  Stated this nurse will notify Dr. Maryalice Smaller.

## 2023-09-17 NOTE — Progress Notes (Signed)
 PROGRESS NOTE    Sara Barnes  ZOX:[id number] DOB: Sep 18, 1940 DOA: 09/06/2023 PCP: Estill Hemming, DO  Subjective: Pt seen and examined. Met with pt and her sister Sara Barnes at bedside. Had left thoracentesis for 600 ml yesterday. Pt breathing some better. Remains on 7 L/min. O2 has not been weaned.  Discussed with pt and her sister Sara Barnes that she had malignant left side pleural effusion. Discussed that this means her cancer has spread to the lung or pleural lining. Discussed that she will have continued/recurrent effusion.  Discussed that her overall prognosis was poor given malignant pleural effusion. Discussed management options regarding her pleural effusion.  Have enlisted assistance of PCCM for Pleurx catheter placement.  Will hold eliquis  after this AM dose. PCCM plans on 48 hour eliquis  washout and plan for Pleurx catheter placement on Monday.   Hospital Course: HPI: Sara Barnes is a 83 y.o. 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   During discharge from the hospital patient being placed on IV Lovenox  40 mg daily for DVT prophylaxis.       ED Course:  At presentation to ED patient found tachycardic heart rate 162, hypertensive, tachypneic and O2 sat 100% on 8 L oxygen.  Currently requiring BiPAP. ED ED O2 sat  dropped to 80% on room air.  Initially placed on 3 L oxygen however requiring more oxygen so patient has been placed on BiPAP.  In the ED patient has been started on Cardizem  drip and IV heparin  drip.   For the management of sepsis patient received 2 L of LR bolus, azithromycin  and ceftriaxone .   Dr. Kermit Ped reported that even though patient has been pretreated with Benadryl  and   Of note, chart review previous CT abdomen pelvis from 01/16/2023 no evidence of hepatic hematoma anymore stable hepatic and osseous metastatic disease progression.   Given patient has allergic reaction with IV contrast discussed case with PCCM Dr. Charon Copper who recommended to give IV Decadron /Solu-Medrol , continue IV Benadryl  and IV famotidine .  History of allergic reaction/hives with prednisone .  Discussed with PCCM reviewed and clarified that it is not uncommon that patient is allergic to prednisone  however it is safe to give Decadron  or Solu-Medrol . Also unable to give epinephrine  shot in the setting of A-fib RVR currently on Cardizem  drip.   Discussed case with cardiology Dr.Bandarage who recommended continue the Cardizem  drip and heparin  drip.  Cardiology will see patient in the daytime.   Hospitalist has been consulted for further management of allergic reaction with contrast, A-fib RVR, sepsis due to pneumonia, acute metabolic encephalopathy and acute hypoxic respiratory failure.  Significant Events: Admitted 09/06/2023 for respiratory distress   Admission Labs: Na 130, K 2.8, CO2 of 23, BUN 7, Scr 0.8. glu 136 WBC 6.0, HgB 11.0, plt 278 INR 1.0 Lactic 2.1 Covid/RSV/flu negative BNP 531  Admission Imaging Studies: CXR Marked severity left basilar atelectasis and/or infiltrate. 2. Small left pleural effusion CTPA No evidence of pulmonary embolism.  2. Small patchy right apical and anterior right upper lobe infiltrates. 3. Small right pleural effusion with a moderate size left pleural effusion. 4. Small, stable  anterior mediastinal lymph node which corresponds to a hypermetabolic focus seen on the prior nuclear medicine PET/CT. 5. Findings consistent with osseous metastatic disease, as described above. 6. Small hiatal hernia. 7. Aortic atherosclerosis.  Antibiotic Therapy: Anti-infectives (From admission, onward)    Start     Dose/Rate Route Frequency Ordered Stop   09/08/23 0800  Vancomycin  (VANCOCIN ) 1,250 mg in sodium chloride  0.9 % 250 mL IVPB  Status:  Discontinued        1,250 mg 166.7 mL/hr over 90 Minutes Intravenous Every 24 hours 09/07/23 0501 09/08/23 0920   09/07/23 0500  ceFEPIme  (MAXIPIME ) 2 g in sodium chloride  0.9 % 100 mL IVPB  Status:  Discontinued        2 g 200 mL/hr over 30 Minutes Intravenous Every 12 hours 09/07/23 0452 09/09/23 1152   09/07/23 0500  vancomycin  (VANCOREADY) IVPB 1500 mg/300 mL        1,500 mg 150 mL/hr over 120 Minutes Intravenous  Once 09/07/23 0452 09/07/23 1040   09/06/23 2300  cefTRIAXone  (ROCEPHIN ) 1 g in sodium chloride  0.9 % 100 mL IVPB        1 g 200 mL/hr over 30 Minutes Intravenous  Once 09/06/23 2246 09/06/23 2344   09/06/23 2300  azithromycin  (ZITHROMAX ) 500 mg in sodium chloride  0.9 % 250 mL IVPB        500 mg 250 mL/hr over 60 Minutes Intravenous  Once 09/06/23 2246 09/07/23 0044       Procedures: 09-08-2023 thoracentesis  Consultants: Cardiology Palliative care PCCM    Assessment and Plan: * Respiratory distress Prior to 09-16-2023 - Tested positive for metapneumovirus - Sepsis present on admission - Repeat chest x-ray reviewed independently.  Await formal radiology report, but on my read does seem more fluid overloaded than previous.  Ordered 1 dose IV Lasix  today, also back on IV Solu-Medro  09-16-2023 resolved. Respiratory distress  Acute respiratory failure with hypoxia (HCC) 09-16-2023 multifactorial. Likely a combination of bilateral pleural effusion L >R, rapid afib, volume overload, viral pneumonia. Currently on 7  L/min.  09-17-2023 likely due to viral pneumonia, malignant pleural effusion and cancer mets.  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 Bedside thoracic U/S today shows moderate/large left effusion. Estimated [phone] ml. Pt agreeable to having IR perform another thoracentesis. I think this will help her oxygen requirements.  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 With the effusion returning in just 7 days, this is an extremely poor prognosis. Have contacted pt's oncologist. Pt may be a candidate for Pleurx catheter placement by either PCCM or IR. May need to hold Eliquis . Also will need to talk to PCCM vs IR about their thoughts about placing Pleurx Catheter in a patient that is still a full code given the risk of infection for a long term chest tube.  Pt and her sister have been resistant to talk to palliative care. However, given that her breast cancer has spread to her pleural, this changes her overall prognosis significantly.  Discussed with pt and her sister Sara Barnes that she had malignant left side pleural effusion. Discussed that this means her cancer has spread to the lung or pleural lining. Discussed that she will have continued/recurrent effusion.  Discussed that her overall prognosis was poor given malignant pleural effusion.  Discussed management options regarding her pleural effusion.  Have enlisted assistance of PCCM for Pleurx catheter placement.  Will hold eliquis  after this AM dose. PCCM plans on 48 hour eliquis  washout and plan for Pleurx catheter placement on Monday.  Pt's oncologist aware of pt's malignant pleural effusion.  Human metapneumovirus (hMPV) pneumonia-resolved as of 09/16/2023 Prior to 09-16-2023 - Tested positive for metapneumovirus - Sepsis present on admission - Repeat chest x-ray reviewed independently.  Await formal radiology report, but on my read does  seem more fluid overloaded than previous.  Ordered 1 dose IV Lasix  today, also back on IV Solu-Medrol   09-16-2023 resolved. 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 Pt has been on IV solumedrol, therefore checking cortisol level will not be useful. Echo showed EF 70%. Pt seem volume overloaded. May need to use Samsca to get rid of free water instead of diuretics.  09-17-2023 will cancel workup of hyponatremia given pt's malignant pleural effusion. Her overall prognosis is quite poor.     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  Paroxysmal A-fib (HCC) Prior to 09-16-2023. Pt had rapid afib.  Now off Cardizem  drip and on oral Cardizem  - No hematoma seen on CT.  This was discussed with patient and sister.  Started on Eliquis   09-16-2023 continue cardizem  CD and Eliquis .  09-17-2023 continue cardizem  CD. Holding Eliquis  after this AM dose for pleurx Catheter placement by PCCM. Hopefully on Monday.  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 Likely due to viral pneumonia, hypoxia, hyponatremia. Now resolved. Pt is AxOx4. Pt is very hard of hearing.  Allergic reaction to contrast-resolved as of 09/16/2023 On admission.  - In the ED patient has been pretreated with Benadryl  and famotidine  and already received Solu-Medrol  125 mg before coming to the ED still after contrast exposure patient developed itchiness and wheezing.  Received second dose of IV Solu-Medrol , Benadryl  and famotidine  in the ED.   -After discussion with PCCM recommended to continue the BiPAP, continue IV Solu-Medrol  or Decadron , IV  Benadryl  and IV famotidine  as scheduled. Unable to give epinephrine  shot given patient has A-fib RVR. -Continue IV Solu-Medrol  40 mg twice daily, iv Benadryl  25 mg 3 times daily and iv famotidine  20 mg twice daily. -Continue ipratropium every 6 hours scheduled.  Avoid albuterol  and levalbuterol  as it provokes A-fib RVR. -Per discussion with patient's sister at the bedside patient is full code  09-16-2023 resolved   DVT prophylaxis: SCDs Start: 09/07/23 0451 Place TED hose Start: 09/07/23 0451  Eliquis    Code Status: Full Code Family Communication: discussed with pt and sister Sara Barnes at bedside Disposition Plan: unknown. Reason for continuing need for hospitalization: needs Pleurx Catheter placement for management of malignant pleural effusion.  Objective: Vitals:   09/16/23 2016 09/17/23 0419 09/17/23 0732 09/17/23 1004  BP: 119/80 (!) 127/91  119/75  Pulse: 95   94  Resp: 16 20    Temp: 98 F (36.7 C) 98.1 F (36.7 C)    TempSrc:  Oral    SpO2: 100% 96% 96%   Weight:      Height:        Intake/Output Summary (Last 24 hours) at 09/17/2023 1029 Last data filed at  09/17/2023 1003 Gross per 24 hour  Intake 660 ml  Output 650 ml  Net 10 ml   Filed Weights   09/06/23 2227  Weight: 78.9 kg    Examination:  Physical Exam Vitals and nursing note reviewed.  Constitutional:      General: She is not in acute distress.    Appearance: She is not toxic-appearing.     Comments: Appears chronically ill  HENT:     Head: Normocephalic and atraumatic.     Nose: Nose normal.  Pulmonary:     Effort: Pulmonary effort is normal.  Abdominal:     General: Bowel sounds are normal.     Palpations: Abdomen is soft.  Skin:    General: Skin is warm and dry.  Neurological:     Mental Status: She is alert and oriented to person, place, and time.     Comments: Hard of hearing     Data Reviewed: I have personally reviewed following labs and imaging studies  CBC: Recent Labs  Lab  09/12/23 0410 09/13/23 0500 09/14/23 0336 09/15/23 0436 09/16/23 0326  WBC 14.0* 12.3* 13.9* 15.4* 16.8*  HGB 9.2* 9.4* 9.4* 10.0* 10.9*  HCT 28.9* 29.0* 29.8* 31.9* 33.6*  MCV 95.1 94.2 95.8 95.2 93.1  PLT 220 239 238 270 293   Basic Metabolic Panel: Recent Labs  Lab 09/12/23 0410 09/13/23 0500 09/14/23 0336 09/15/23 0436 09/16/23 0326  NA 127*  127* 127* 127* 130* 129*  K 4.3 4.0 4.5 4.3 4.0  CL 93* 92* 94* 97* 95*  CO2 27 23 24 25 26   GLUCOSE 131* 198* 117* 97 128*  BUN 29* 28* 28* 22 21  CREATININE 0.89 0.74 0.62 0.53 0.45  CALCIUM  8.4* 8.3* 8.1* 8.0* 8.1*   GFR: Estimated Creatinine Clearance: 58.4 mL/min (by C-G formula based on SCr of 0.45 mg/dL). BNP (last 3 results) Recent Labs    09/07/23 0532  BNP 531.6*   CBG: Recent Labs  Lab 09/15/23 1749 09/16/23 0022 09/16/23 1625 09/17/23 0030 09/17/23 0630  GLUCAP 143* 166* 152* 149* 111*   Sepsis Labs: Recent Labs  Lab 09/11/23 0345 09/12/23 0410  PROCALCITON 0.12 <0.10    Recent Results (from the past 240 hours)  MRSA Next Gen by PCR, Nasal     Status: None   Collection Time: 09/07/23 12:30 PM   Specimen: Nasal Mucosa; Nasal Swab  Result Value Ref Range Status   MRSA by PCR Next Gen NOT DETECTED NOT DETECTED Final    Comment: (NOTE) The GeneXpert MRSA Assay (FDA approved for NASAL specimens only), is one component of a comprehensive MRSA colonization surveillance program. It is not intended to diagnose MRSA infection nor to guide or monitor treatment for MRSA infections. Test performance is not FDA approved in patients less than 36 years old. Performed at [facility], [address] [place], [place] [address]   Respiratory (~20 pathogens) panel by PCR     Status: Abnormal   Collection Time: 09/08/23  3:49 PM   Specimen: Nasopharyngeal Swab; Respiratory  Result Value Ref Range Status   Adenovirus NOT DETECTED NOT DETECTED Final   Coronavirus 229E NOT DETECTED NOT  DETECTED Final    Comment: (NOTE) The Coronavirus on the Respiratory Panel, DOES NOT test for the novel  Coronavirus (2019 nCoV)    Coronavirus HKU1 NOT DETECTED NOT DETECTED Final   Coronavirus NL63 NOT DETECTED NOT DETECTED Final   Coronavirus OC43 NOT DETECTED NOT DETECTED Final   Metapneumovirus DETECTED (A)  NOT DETECTED Final   Rhinovirus / Enterovirus NOT DETECTED NOT DETECTED Final   Influenza A NOT DETECTED NOT DETECTED Final   Influenza B NOT DETECTED NOT DETECTED Final   Parainfluenza Virus 1 NOT DETECTED NOT DETECTED Final   Parainfluenza Virus 2 NOT DETECTED NOT DETECTED Final   Parainfluenza Virus 3 NOT DETECTED NOT DETECTED Final   Parainfluenza Virus 4 NOT DETECTED NOT DETECTED Final   Respiratory Syncytial Virus NOT DETECTED NOT DETECTED Final   Bordetella pertussis NOT DETECTED NOT DETECTED Final   Bordetella Parapertussis NOT DETECTED NOT DETECTED Final   Chlamydophila pneumoniae NOT DETECTED NOT DETECTED Final   Mycoplasma pneumoniae NOT DETECTED NOT DETECTED Final    Comment: Performed at [facility], [address], [place], [place] [address]  Body fluid culture w Gram Stain     Status: None   Collection Time: 09/08/23  4:00 PM   Specimen: Thoracentesis; Body Fluid  Result Value Ref Range Status   Specimen Description   Final    THORACENTESIS Performed at [facility], [address], [place], [place] [address]    Special Requests   Final    NONE Performed at [facility], [address] [place], [place] [address]    Gram Stain NO WBC SEEN NO ORGANISMS SEEN   Final   Culture   Final    NO GROWTH 3 DAYS Performed at [facility], [address], [place], [place] [address]    Report Status 09/12/2023 FINAL  Final  Body fluid culture w Gram Stain     Status: None (Preliminary result)   Collection Time: 09/16/23  3:34 PM   Specimen: PATH Cytology Pleural fluid  Result Value Ref Range Status   Specimen  Description   Final    PLEURAL Performed at [facility], [address], [place], [place] [address]    Special Requests   Final    NONE Performed at [facility], [address], [place], [place] [address]    Gram Stain   Final    FEW WBC PRESENT, PREDOMINANTLY MONONUCLEAR NO ORGANISMS SEEN    Culture   Final    NO GROWTH < 24 HOURS Performed at [facility], [address], [place], [place] [address]    Report Status PENDING  Incomplete     Radiology Studies: DG Chest Port 1 View Result Date: 09/16/2023 CLINICAL DATA:  Left pleural effusion EXAM: PORTABLE CHEST 1 VIEW COMPARISON:  CT of the chest 09/07/2023.  Chest x-ray 09/16/2023. FINDINGS: The heart is mildly enlarged, unchanged. Right-sided central venous catheter tip projects over the distal SVC. Bilateral multifocal airspace disease is again noted, not significantly changed. There is a small left pleural effusion which has slightly decreased. No pneumothorax. Osseous structures are stable. There surgical clips in the left chest wall. IMPRESSION: 1. Stable bilateral multifocal airspace disease. 2. Mild decrease in small left pleural effusion. Electronically Signed   By: Tyron Gallon M.D.   On: 09/16/2023 16:56   US  THORACENTESIS ASP PLEURAL SPACE W/IMG GUIDE Result Date: 09/16/2023 INDICATION: Shortness of breath. Left-sided pleural effusion. Request for diagnostic and therapeutic thoracentesis. EXAM: ULTRASOUND GUIDED LEFT THORACENTESIS MEDICATIONS: 1% plain lidocaine , 5 mL COMPLICATIONS: None immediate. PROCEDURE: An ultrasound guided thoracentesis was thoroughly discussed with the patient and questions answered. The benefits, risks, alternatives and complications were also discussed. The patient understands and wishes to proceed with the procedure. Written consent was obtained. Ultrasound  was performed to localize and mark an adequate pocket of fluid in the left chest. The area was then prepped and  draped in the normal sterile fashion. 1% Lidocaine  was used for local anesthesia. Under ultrasound guidance a 6 Fr Safe-T-Centesis catheter was introduced. Thoracentesis was performed. The catheter was removed and a dressing applied. FINDINGS: A total of approximately 600 mL of clear, amber colored fluid was removed. Samples were sent to the laboratory as requested by the clinical team. IMPRESSION: Successful ultrasound guided left thoracentesis yielding 600 mL of pleural fluid. Procedure performed by Prudence Brown PA-C and supervised by Dr. Alyssa Jumper Electronically Signed   By: Melven Stable.  Shick M.D.   On: 09/16/2023 16:29   DG CHEST PORT 1 VIEW Result Date: 09/16/2023 CLINICAL DATA:  Respiratory failure. EXAM: PORTABLE CHEST 1 VIEW COMPARISON:  Sep 15, 2023. FINDINGS: Stable cardiomediastinal silhouette. Right-sided PICC line is unchanged. Stable bilateral lung opacities are noted most consistent with pneumonia or possibly edema. Small bilateral pleural effusions are noted. Bony thorax is unremarkable. IMPRESSION: Stable bilateral lung opacities and small pleural effusions as noted above. Electronically Signed   By: Rosalene Colon M.D.   On: 09/16/2023 11:33    Scheduled Meds:  arformoterol   15 mcg Nebulization BID   Chlorhexidine  Gluconate Cloth  6 each Topical Daily   vitamin D3  1,000 Units Oral Daily   diltiazem   240 mg Oral Daily   FLUoxetine   20 mg Oral Daily   fluticasone   2 spray Each Nare Daily   guaiFENesin   600 mg Oral BID   liver oil-zinc  oxide   Topical TID   nystatin   5 mL Oral QID   revefenacin   175 mcg Nebulization Daily   sodium chloride  flush  10-40 mL Intracatheter Q12H   sodium chloride  flush  3 mL Intravenous Q12H   sodium chloride   1 g Oral TID WC   Continuous Infusions:   LOS: 10 days   Time spent: 60 minutes  Unk Garb, DO  [facility]  09/17/2023, 10:29 AM

## 2023-09-17 NOTE — Procedures (Addendum)
 Insertion of Chest Tube Procedure Note  Sara Barnes  [id number]  March 15, 1941  Date:09/17/23  Time:4:55 PM    Provider Performing: Sula End   Procedure: Chest Tube Insertion [id number])  Indication(s) Effusion  Consent Risks of the procedure as well as the alternatives and risks of each were explained to the patient and/or caregiver.  Consent for the procedure was obtained and is signed in the bedside chart  Anesthesia Topical only with 1% lidocaine     Time Out Verified patient identification, verified procedure, site/side was marked, verified correct patient position, special equipment/implants available, medications/allergies/relevant history reviewed, required imaging and test results available.   Sterile Technique Maximal sterile technique including full sterile barrier drape, hand hygiene, sterile gown, sterile gloves, mask, hair covering, sterile ultrasound probe cover (if used).   Procedure Description Ultrasound used to identify appropriate pleural anatomy for placement and overlying skin marked. Area of placement cleaned and draped in sterile fashion.  A 14 French pigtail pleural catheter was placed into the left pleural space using Seldinger technique. Appropriate return of fluid was obtained.  The tube was connected to atrium and placed on -20 cm H2O wall suction.   Complications/Tolerance None; patient tolerated the procedure well. Chest X-ray is ordered to verify placement.   EBL Minimal  Specimen(s) none   Sara Barnes AGACNP-BC   [facility] 09/17/2023, 4:56 PM  Please see [email or web address] for pager details.  From 7A-7P if no response, please call [phone]. After hours, please call ELink [phone].

## 2023-09-18 ENCOUNTER — Inpatient Hospital Stay (HOSPITAL_COMMUNITY)

## 2023-09-18 DIAGNOSIS — C50912 Malignant neoplasm of unspecified site of left female breast: Secondary | ICD-10-CM | POA: Diagnosis not present

## 2023-09-18 DIAGNOSIS — Z66 Do not resuscitate: Secondary | ICD-10-CM

## 2023-09-18 DIAGNOSIS — J910 Malignant pleural effusion: Secondary | ICD-10-CM | POA: Diagnosis not present

## 2023-09-18 DIAGNOSIS — C7931 Secondary malignant neoplasm of brain: Secondary | ICD-10-CM | POA: Diagnosis not present

## 2023-09-18 DIAGNOSIS — J9601 Acute respiratory failure with hypoxia: Secondary | ICD-10-CM | POA: Diagnosis not present

## 2023-09-18 DIAGNOSIS — E871 Hypo-osmolality and hyponatremia: Secondary | ICD-10-CM | POA: Diagnosis not present

## 2023-09-18 LAB — CBC WITH DIFFERENTIAL/PLATELET
Abs Immature Granulocytes: 0.41 10*3/uL — ABNORMAL HIGH (ref 0.00–0.07)
Basophils Absolute: 0.1 10*3/uL (ref 0.0–0.1)
Basophils Relative: 0 %
Eosinophils Absolute: 0.2 10*3/uL (ref 0.0–0.5)
Eosinophils Relative: 1 %
HCT: 36.1 % (ref 36.0–46.0)
Hemoglobin: 11.2 g/dL — ABNORMAL LOW (ref 12.0–15.0)
Immature Granulocytes: 2 %
Lymphocytes Relative: 2 %
Lymphs Abs: 0.6 10*3/uL — ABNORMAL LOW (ref 0.7–4.0)
MCH: 30.2 pg (ref 26.0–34.0)
MCHC: 31 g/dL (ref 30.0–36.0)
MCV: 97.3 fL (ref 80.0–100.0)
Monocytes Absolute: 0.8 10*3/uL (ref 0.1–1.0)
Monocytes Relative: 3 %
Neutro Abs: 23.9 10*3/uL — ABNORMAL HIGH (ref 1.7–7.7)
Neutrophils Relative %: 92 %
Platelets: 316 10*3/uL (ref 150–400)
RBC: 3.71 MIL/uL — ABNORMAL LOW (ref 3.87–5.11)
RDW: 12.5 % (ref 11.5–15.5)
WBC: 26 10*3/uL — ABNORMAL HIGH (ref 4.0–10.5)
nRBC: 0 % (ref 0.0–0.2)

## 2023-09-18 LAB — GLUCOSE, CAPILLARY
Glucose-Capillary: 111 mg/dL — ABNORMAL HIGH (ref 70–99)
Glucose-Capillary: 94 mg/dL (ref 70–99)
Glucose-Capillary: 97 mg/dL (ref 70–99)

## 2023-09-18 LAB — BASIC METABOLIC PANEL WITH GFR
Anion gap: 9 (ref 5–15)
BUN: 24 mg/dL — ABNORMAL HIGH (ref 8–23)
CO2: 23 mmol/L (ref 22–32)
Calcium: 7.9 mg/dL — ABNORMAL LOW (ref 8.9–10.3)
Chloride: 95 mmol/L — ABNORMAL LOW (ref 98–111)
Creatinine, Ser: 0.83 mg/dL (ref 0.44–1.00)
GFR, Estimated: >60 mL/min (ref >60)
Glucose, Bld: 101 mg/dL — ABNORMAL HIGH (ref 70–99)
Potassium: 4 mmol/L (ref 3.5–5.1)
Sodium: 127 mmol/L — ABNORMAL LOW (ref 135–145)

## 2023-09-18 LAB — CYTOLOGY - NON PAP

## 2023-09-18 LAB — MAGNESIUM: Magnesium: 1.9 mg/dL (ref 1.7–2.4)

## 2023-09-18 MED ORDER — POTASSIUM CHLORIDE 20 MEQ PO PACK
40.0000 meq | PACK | Freq: Once | ORAL | Status: AC
  Administered 2023-09-18: 40 meq via ORAL
  Filled 2023-09-18: qty 2

## 2023-09-18 MED ORDER — HYDROMORPHONE HCL 1 MG/ML IJ SOLN
1.0000 mg | Freq: Once | INTRAMUSCULAR | Status: AC
  Administered 2023-09-18: 1 mg via INTRAVENOUS
  Filled 2023-09-18: qty 1

## 2023-09-18 MED ORDER — ACETAMINOPHEN 325 MG PO TABS
650.0000 mg | ORAL_TABLET | Freq: Three times a day (TID) | ORAL | Status: DC
  Administered 2023-09-18 (×3): 650 mg via ORAL
  Filled 2023-09-18 (×3): qty 2

## 2023-09-18 MED ORDER — METOPROLOL TARTRATE 5 MG/5ML IV SOLN
5.0000 mg | Freq: Four times a day (QID) | INTRAVENOUS | Status: DC | PRN
  Administered 2023-09-19 – 2023-09-21 (×4): 5 mg via INTRAVENOUS
  Filled 2023-09-18 (×4): qty 5

## 2023-09-18 MED ORDER — MAGNESIUM SULFATE 2 GM/50ML IV SOLN
2.0000 g | Freq: Once | INTRAVENOUS | Status: AC
  Administered 2023-09-18: 2 g via INTRAVENOUS
  Filled 2023-09-18: qty 50

## 2023-09-18 MED ORDER — OXYCODONE HCL 5 MG PO TABS
5.0000 mg | ORAL_TABLET | ORAL | Status: DC | PRN
  Administered 2023-09-18 – 2023-09-19 (×4): 10 mg via ORAL
  Administered 2023-09-19: 5 mg via ORAL
  Filled 2023-09-18: qty 1
  Filled 2023-09-18 (×4): qty 2

## 2023-09-18 MED ORDER — LIDOCAINE 5 % EX PTCH
1.0000 | MEDICATED_PATCH | CUTANEOUS | Status: DC
  Administered 2023-09-18 – 2023-09-19 (×2): 1 via TRANSDERMAL
  Filled 2023-09-18 (×2): qty 1

## 2023-09-18 MED ORDER — HYDROMORPHONE HCL 1 MG/ML IJ SOLN
0.5000 mg | INTRAMUSCULAR | Status: DC | PRN
  Administered 2023-09-19 – 2023-09-20 (×5): 0.5 mg via INTRAVENOUS
  Filled 2023-09-18 (×6): qty 0.5

## 2023-09-18 NOTE — Progress Notes (Signed)
 Pt is currently on 5L HFNC and is tolerating well. No distress noted. BIPAP is in room at bedside on standby. It is not needed at this time. Will continue to monitor pt and place patient on BIPAP if need arises.

## 2023-09-18 NOTE — Progress Notes (Signed)
   Ordered IV dilaudid  for pain control.  Unk Garb, DO [facility]

## 2023-09-18 NOTE — Progress Notes (Signed)
 PT Cancellation Note  Patient Details Name: SARIYA TRICKEY MRN: [id number] DOB: [birth year]   Cancelled Treatment:     Hold off for today, per RN due to Pt's increased c/o pain/discomfort and elevated HR 130's   Bess Broody  PTA Acute  Rehabilitation Services Office M-F          [phone]

## 2023-09-18 NOTE — Progress Notes (Signed)
 NAME:  Sara Barnes, MRN:  [id number], DOB:  11-06-1940, LOS: 11 ADMISSION DATE:  09/06/2023, CONSULTATION DATE:  5/13 REFERRING MD:  Jenell Mirza, CHIEF COMPLAINT:  wheezing, respiratory failure   History of Present Illness:  Sara Barnes is a 83 y.o. woman with a history of metastatic breast cancer who was admitted to the hospital with acute respiratory failure on 5/12 after developing shortness of breath, productive cough, wheezing on 5/11.  She had a fall with hip fracture in April and has been at rehab since then, making good progress.  At baseline for the last 2 years she has had activity limitation, but it is unclear if this is more due to fatigue or dyspnea on exertion.  This had been at baseline until her symptoms began acutely 2 days ago.  En route to the hospital on BiPAP she developed A-fib with RVR after receiving a breathing treatment.  She is out of A-fib today and remains off BiPAP.  She had an episode of worse shortness of breath this morning.  Family requesting pulmonology consult for wheezing.  No PTA inhalers.  No history of tobacco abuse.  Has a history of thoracentesis in January 2023> atypical cells but not confirmed malignant.  She reports that her symptoms now feel like when she had her thoracentesis in 2023.  No chronic antiplatelet or anticoagulant medications.  History is obtained with help of her sister due to patient being very hard of hearing.  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 Hip fracture 07/2023 Crohn's  Significant Hospital Events: Including procedures, antibiotic start and stop dates in addition to other pertinent events   5/12 admitted, started on azithromycin  and ceftriaxone  5/13 left thoracentesis with 800 cc clear yellow fluid removed 5/14 issues with pain and confusion overnight 5/20 1 repeat Thora 600 cc fluid removed 5/22 pigtail  Interim History / Subjective:   Complains of severe pain at pigtail  site Pigtail has drained 350 cc / 24 hours, 200 cc since this morning  Objective    Blood pressure 111/76, pulse 94, temperature 97.9 F (36.6 C), temperature source Oral, resp. rate 18, height 5\' 6"  (1.676 m), weight 78.9 kg, SpO2 96%.        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  She does benefit from thoracenteses.   - Pigtail placed and this continues to drain significant amount of fluid. Once drainage decreases plan is for talc pleurodesis.  She is in severe pain, I discussed that pleurodesis would not have optimal results since she is still having significant drainage. Will try to control pain with narcotics, scheduled Tylenol  and adding Lidoderm  patch. Unfortunately NSAIDs are contraindicated due to chron's disease, wonder if we can use IV Toradol  for breakthrough  [name] MD. FCCP. [facility] Pager : [phone]  If no response  to pager , please call [phone] until 7 pm After 7:00 pm call Elink  [phone]     09/18/2023, 10:51 AM

## 2023-09-18 NOTE — Progress Notes (Signed)
 PROGRESS NOTE    Sara Barnes  NUU:[id number] DOB: 1941/04/10 DOA: 09/06/2023 PCP: Estill Hemming, DO  Subjective: Pt seen and examined. Pt's sister Sara Barnes at bedside. Pt had left side pigtail catheter placed into pleural space yesterday by PCCM in hopes to attempt talc pleurodesis today. If this fails, then pt will get pleurx catheter.  Pt still refusing SNF and wants to go home with care provided by sister.   Hospital Course: HPI: Sara Barnes is a 83 y.o. 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   During discharge from the hospital patient being placed on IV Lovenox  40 mg daily for DVT prophylaxis.       ED Course:  At presentation to ED patient found tachycardic heart rate 162, hypertensive, tachypneic and O2 sat 100% on 8 L oxygen.  Currently requiring BiPAP. ED ED O2 sat dropped to 80% on room air.  Initially placed on 3 L oxygen however requiring more oxygen so patient has been placed on BiPAP.  In the ED patient has been started on Cardizem  drip and IV heparin  drip.   For the management of sepsis patient received 2 L of LR bolus, azithromycin  and ceftriaxone .   Dr. Kermit Ped reported that even though patient has been pretreated with Benadryl  and   Of note, chart review previous CT abdomen pelvis from  01/16/2023 no evidence of hepatic hematoma anymore stable hepatic and osseous metastatic disease progression.   Given patient has allergic reaction with IV contrast discussed case with PCCM Dr. Charon Copper who recommended to give IV Decadron /Solu-Medrol , continue IV Benadryl  and IV famotidine .  History of allergic reaction/hives with prednisone .  Discussed with PCCM reviewed and clarified that it is not uncommon that patient is allergic to prednisone  however it is safe to give Decadron  or Solu-Medrol . Also unable to give epinephrine  shot in the setting of A-fib RVR currently on Cardizem  drip.   Discussed case with cardiology Dr.Bandarage who recommended continue the Cardizem  drip and heparin  drip.  Cardiology will see patient in the daytime.   Hospitalist has been consulted for further management of allergic reaction with contrast, A-fib RVR, sepsis due to pneumonia, acute metabolic encephalopathy and acute hypoxic respiratory failure.  Significant Events: Admitted 09/06/2023 for respiratory distress   Admission Labs: Na 130, K 2.8, CO2 of 23, BUN 7, Scr 0.8. glu 136 WBC 6.0, HgB 11.0, plt 278 INR 1.0 Lactic 2.1 Covid/RSV/flu negative BNP 531  Admission Imaging Studies: CXR Marked severity left basilar atelectasis and/or infiltrate. 2. Small left pleural effusion CTPA No evidence of pulmonary embolism. 2. Small patchy right apical and anterior right upper lobe infiltrates. 3. Small right pleural effusion with a moderate size left pleural effusion. 4. Small, stable anterior mediastinal lymph node which corresponds to a hypermetabolic focus seen on the prior nuclear medicine PET/CT. 5. Findings consistent with osseous metastatic disease, as described above. 6. Small hiatal hernia. 7. Aortic atherosclerosis.  Antibiotic Therapy: Anti-infectives (From admission, onward)  Start     Dose/Rate Route Frequency Ordered Stop   09/08/23 0800  Vancomycin  (VANCOCIN ) 1,250 mg in sodium chloride  0.9 % 250  mL IVPB  Status:  Discontinued        1,250 mg 166.7 mL/hr over 90 Minutes Intravenous Every 24 hours 09/07/23 0501 09/08/23 0920   09/07/23 0500  ceFEPIme  (MAXIPIME ) 2 g in sodium chloride  0.9 % 100 mL IVPB  Status:  Discontinued        2 g 200 mL/hr over 30 Minutes Intravenous Every 12 hours 09/07/23 0452 09/09/23 1152   09/07/23 0500  vancomycin  (VANCOREADY) IVPB 1500 mg/300 mL        1,500 mg 150 mL/hr over 120 Minutes Intravenous  Once 09/07/23 0452 09/07/23 1040   09/06/23 2300  cefTRIAXone  (ROCEPHIN ) 1 g in sodium chloride  0.9 % 100 mL IVPB        1 g 200 mL/hr over 30 Minutes Intravenous  Once 09/06/23 2246 09/06/23 2344   09/06/23 2300  azithromycin  (ZITHROMAX ) 500 mg in sodium chloride  0.9 % 250 mL IVPB        500 mg 250 mL/hr over 60 Minutes Intravenous  Once 09/06/23 2246 09/07/23 0044       Procedures: 09-08-2023 thoracentesis  Consultants: Cardiology Palliative care PCCM    Assessment and Plan: * Respiratory distress-resolved as of 09/18/2023 Prior to 09-16-2023 - Tested positive for metapneumovirus - Sepsis present on admission - Repeat chest x-ray reviewed independently.  Await formal radiology report, but on my read does seem more fluid overloaded than previous.  Ordered 1 dose IV Lasix  today, also back on IV Solu-Medro  09-16-2023 resolved. Respiratory distress  Acute respiratory failure with hypoxia (HCC) 09-16-2023 multifactorial. Likely a combination of bilateral pleural effusion L >R, rapid afib, volume overload, viral pneumonia. Currently on 7 L/min.  09-17-2023 likely due to viral pneumonia, malignant pleural effusion and cancer mets.  09-18-2023 wean O2 to 2 L/min. Will obtain home O2 eval on Monday.  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 Bedside thoracic U/S today shows moderate/large left effusion. Estimated [phone]. Pt agreeable to having IR perform another thoracentesis. I think  this will help her oxygen requirements.  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 With the effusion returning in just 7 days, this is an extremely poor prognosis. Have contacted pt's oncologist. Pt may be a candidate for Pleurx catheter placement by either PCCM or IR. May need to hold Eliquis . Also will need to talk to PCCM vs IR about their thoughts about placing Pleurx Catheter in a patient that is still a full code given the risk of infection for a long term chest tube.  Pt and her sister have been resistant to talk to palliative care. However, given that her breast cancer has spread to her pleural, this changes her overall prognosis significantly.  Discussed with pt and her sister [name] that she had malignant left side pleural effusion. Discussed that this means her cancer has spread to the lung or pleural lining. Discussed that she will have continued/recurrent effusion.  Discussed that her overall prognosis was poor given malignant pleural effusion. Discussed management options regarding her pleural effusion.  Have enlisted assistance of PCCM for Pleurx catheter placement.  Will hold eliquis  after this AM dose. PCCM plans on 48 hour eliquis  washout and plan for Pleurx catheter placement on Monday.  Pt's oncologist aware of pt's malignant pleural effusion.  09-18-2023 pt has  about 400 ml in chest atrium this AM.  Pt had left side pigtail catheter placed into pleural space yesterday by PCCM in hopes to attempt talc pleurodesis today. If this fails, then pt will get pleurx catheter.   I think pt and her sister have unrealistic expectation on pt's overall prognosis of her metastatic cancer. Oncology following. Pt and sister resistant to palliative care attempts on goals of care discussion.  Pt is refusing to go to SNF and wants to go home with pt's elderly sister to provide for her care.   Human metapneumovirus (hMPV) pneumonia-resolved as of  09/16/2023 Prior to 09-16-2023 - Tested positive for metapneumovirus - Sepsis present on admission - Repeat chest x-ray reviewed independently.  Await formal radiology report, but on my read does seem more fluid overloaded than previous.  Ordered 1 dose IV Lasix  today, also back on IV Solu-Medrol   09-16-2023 resolved. 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 Pt has been on IV solumedrol, therefore checking cortisol level will not be useful. Echo showed EF 70%. Pt seem volume overloaded. May need to use Samsca to get rid of free water instead of diuretics.  09-17-2023 will cancel workup of hyponatremia given pt's malignant pleural effusion. Her overall prognosis is quite poor.  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  09-18-2023 I think pt and her sister have unrealistic expectation on pt's overall prognosis of her metastatic cancer. Oncology following. Pt and sister resistant to palliative care attempts on goals of care discussion. Pt is refusing to go to SNF and wants to go home with pt's elderly sister to provide for her care.   Paroxysmal A-fib (HCC) Prior to 09-16-2023. Pt had rapid afib.  Now off Cardizem  drip and on oral Cardizem  - No hematoma seen on CT.  This was discussed with patient and sister.  Started on Eliquis   09-16-2023 continue cardizem  CD and Eliquis .  09-17-2023 continue cardizem  CD. Holding Eliquis  after this AM dose for pleurx Catheter placement by PCCM. Hopefully on Monday.  09-18-2023 currently off Eliquis  per PCCM. Continue cardizem  CD.  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  Acute metabolic  encephalopathy-resolved as of 09/17/2023 09-16-2023 multifactorial. Likely due to viral pneumonia, hypoxia, hyponatremia. Now resolved. Pt is AxOx4. Pt is very hard of hearing.  Allergic reaction to contrast-resolved as of 09/16/2023 On admission.  - In the ED patient has been pretreated with Benadryl  and famotidine  and already received Solu-Medrol  125 mg before coming to the ED still after contrast exposure patient developed itchiness and wheezing.  Received second dose of IV Solu-Medrol , Benadryl  and famotidine  in the ED.   -After discussion with PCCM recommended to continue the BiPAP, continue IV Solu-Medrol  or Decadron , IV Benadryl  and IV famotidine  as scheduled. Unable to give epinephrine  shot given patient has A-fib RVR. -Continue IV Solu-Medrol  40 mg twice daily, iv Benadryl  25 mg 3 times daily and iv famotidine  20 mg twice daily. -Continue ipratropium every 6 hours scheduled.  Avoid albuterol  and levalbuterol  as it provokes A-fib RVR. -Per discussion with patient's sister at the bedside patient is full code  09-16-2023 resolved  DVT prophylaxis: SCDs Start: 09/07/23 0451 Place TED hose Start: 09/07/23 0451    Code  Status: Full Code Family Communication: discussed with pt and sister Sara Barnes at bedside Disposition Plan: pt refusing SNF. Wants to go home with sister [name]. Reason for continuing need for hospitalization: still has chest tube to gravity drainage.  Objective: Vitals:   09/17/23 1959 09/17/23 2042 09/18/23 0531 09/18/23 0806  BP: 114/62  111/76   Pulse: 90  94   Resp: 18  18   Temp: 97.8 F (36.6 C)  97.9 F (36.6 C)   TempSrc: Oral  Oral   SpO2: 98% 98% 95% 99%  Weight:      Height:        Intake/Output Summary (Last 24 hours) at 09/18/2023 1019 Last data filed at 09/18/2023 0804 Gross per 24 hour  Intake 1240 ml  Output 1400 ml  Net -160 ml   Filed Weights   09/06/23 2227  Weight: 78.9 kg    Examination:  Physical Exam Vitals and nursing note reviewed.   Constitutional:      General: She is not in acute distress.    Appearance: She is not toxic-appearing or diaphoretic.     Comments: Chronically ill appearing  HENT:     Head: Normocephalic and atraumatic.  Eyes:     General: No scleral icterus. Cardiovascular:     Rate and Rhythm: Normal rate and regular rhythm.  Pulmonary:     Effort: Pulmonary effort is normal.  Abdominal:     General: Abdomen is flat. Bowel sounds are normal.  Skin:    Capillary Refill: Capillary refill takes less than 2 seconds.  Neurological:     Mental Status: She is alert and oriented to person, place, and time.     Data Reviewed: I have personally reviewed following labs and imaging studies  CBC: Recent Labs  Lab 09/12/23 0410 09/13/23 0500 09/14/23 0336 09/15/23 0436 09/16/23 0326  WBC 14.0* 12.3* 13.9* 15.4* 16.8*  HGB 9.2* 9.4* 9.4* 10.0* 10.9*  HCT 28.9* 29.0* 29.8* 31.9* 33.6*  MCV 95.1 94.2 95.8 95.2 93.1  PLT 220 239 238 270 293   Basic Metabolic Panel: Recent Labs  Lab 09/12/23 0410 09/13/23 0500 09/14/23 0336 09/15/23 0436 09/16/23 0326  NA 127*  127* 127* 127* 130* 129*  K 4.3 4.0 4.5 4.3 4.0  CL 93* 92* 94* 97* 95*  CO2 27 23 24 25 26   GLUCOSE 131* 198* 117* 97 128*  BUN 29* 28* 28* 22 21  CREATININE 0.89 0.74 0.62 0.53 0.45  CALCIUM  8.4* 8.3* 8.1* 8.0* 8.1*   GFR: Estimated Creatinine Clearance: 58.4 mL/min (by C-G formula based on SCr of 0.45 mg/dL). BNP (last 3 results) Recent Labs    09/07/23 0532  BNP 531.6*   CBG: Recent Labs  Lab 09/17/23 0630 09/17/23 1216 09/17/23 1754 09/18/23 0017 09/18/23 0606  GLUCAP 111* 114* 144* 97 94   Sepsis Labs: Recent Labs  Lab 09/12/23 0410  PROCALCITON <0.10    Recent Results (from the past 240 hours)  Respiratory (~20 pathogens) panel by PCR     Status: Abnormal   Collection Time: 09/08/23  3:49 PM   Specimen: Nasopharyngeal Swab; Respiratory  Result Value Ref Range Status   Adenovirus NOT DETECTED NOT  DETECTED Final   Coronavirus 229E NOT DETECTED NOT DETECTED Final    Comment: (NOTE) The Coronavirus on the Respiratory Panel, DOES NOT test for the novel  Coronavirus (2019 nCoV)    Coronavirus HKU1 NOT DETECTED NOT DETECTED Final   Coronavirus NL63 NOT DETECTED NOT DETECTED Final   Coronavirus  OC43 NOT DETECTED NOT DETECTED Final   Metapneumovirus DETECTED (A) NOT DETECTED Final   Rhinovirus / Enterovirus NOT DETECTED NOT DETECTED Final   Influenza A NOT DETECTED NOT DETECTED Final   Influenza B NOT DETECTED NOT DETECTED Final   Parainfluenza Virus 1 NOT DETECTED NOT DETECTED Final   Parainfluenza Virus 2 NOT DETECTED NOT DETECTED Final   Parainfluenza Virus 3 NOT DETECTED NOT DETECTED Final   Parainfluenza Virus 4 NOT DETECTED NOT DETECTED Final   Respiratory Syncytial Virus NOT DETECTED NOT DETECTED Final   Bordetella pertussis NOT DETECTED NOT DETECTED Final   Bordetella Parapertussis NOT DETECTED NOT DETECTED Final   Chlamydophila pneumoniae NOT DETECTED NOT DETECTED Final   Mycoplasma pneumoniae NOT DETECTED NOT DETECTED Final    Comment: Performed at [facility], [address] [place], [place] [address]  Body fluid culture w Gram Stain     Status: None   Collection Time: 09/08/23  4:00 PM   Specimen: Thoracentesis; Body Fluid  Result Value Ref Range Status   Specimen Description   Final    THORACENTESIS Performed at [facility], [address], [place], [place] [address]    Special Requests   Final    NONE Performed at [facility], [address] [place], [place] [address]    Gram Stain NO WBC SEEN NO ORGANISMS SEEN   Final   Culture   Final    NO GROWTH 3 DAYS Performed at [facility], [address] [place], [place] [address]    Report Status 09/12/2023 FINAL  Final  Body fluid culture w Gram Stain     Status: None (Preliminary result)   Collection Time: 09/16/23  3:34 PM   Specimen: PATH Cytology Pleural  fluid  Result Value Ref Range Status   Specimen Description   Final    PLEURAL Performed at [facility], [address], [place], [place] [address]    Special Requests   Final    NONE Performed at [facility], [address] [place], [place] [address]    Gram Stain   Final    FEW WBC PRESENT, PREDOMINANTLY MONONUCLEAR NO ORGANISMS SEEN    Culture   Final    NO GROWTH 2 DAYS Performed at [facility], [address], [place], [place] [address]    Report Status PENDING  Incomplete     Radiology Studies: DG Chest Port 1 View Result Date: 09/18/2023 CLINICAL DATA:  Chest tube.  Pleural effusion EXAM: PORTABLE CHEST 1 VIEW COMPARISON:  09/17/2023 FINDINGS: Bilateral airspace disease. Small right pleural effusion with mild increase. No pneumothorax. Right PICC with tip at the upper right atrium. A drain projects over the left chest. With unchanged clamping or mild kink. IMPRESSION: Mild increase in right pleural fluid. Similar degree of extensive bilateral airspace disease. Electronically Signed   By: Ronnette Coke M.D.   On: 09/18/2023 09:46   DG Chest Port 1 View Result Date: 09/17/2023 CLINICAL DATA:  Chest tube EXAM: PORTABLE CHEST 1 VIEW COMPARISON:  09/16/2023, 09/15/2023, 09/08/2023 FINDINGS: Right upper extremity central venous catheter tip over the cavoatrial region. New left-sided chest drainage catheter projects over the medial lower left chest. Decreased left effusion. Enlarged cardiomediastinal silhouette. Small right pleural effusion. Widespread heterogeneous bilateral airspace disease appears slightly worse. No pneumothorax. IMPRESSION: 1. New left-sided chest drainage catheter with decreased left effusion. No pneumothorax. 2. Widespread heterogeneous bilateral airspace disease appears slightly worse.  Small right pleural effusion. Electronically Signed   By: Esmeralda Hedge M.D.   On: 09/17/2023 16:53   DG Chest Port 1 View Result Date:  09/16/2023 CLINICAL DATA:  Left pleural effusion EXAM: PORTABLE CHEST 1 VIEW COMPARISON:  CT of the chest 09/07/2023.  Chest x-ray 09/16/2023. FINDINGS: The heart is mildly enlarged, unchanged. Right-sided central venous catheter tip projects over the distal SVC. Bilateral multifocal airspace disease is again noted, not significantly changed. There is a small left pleural effusion which has slightly decreased. No pneumothorax. Osseous structures are stable. There surgical clips in the left chest wall. IMPRESSION: 1. Stable bilateral multifocal airspace disease. 2. Mild decrease in small left pleural effusion. Electronically Signed   By: Tyron Gallon M.D.   On: 09/16/2023 16:56   US  THORACENTESIS ASP PLEURAL SPACE W/IMG GUIDE Result Date: 09/16/2023 INDICATION: Shortness of breath. Left-sided pleural effusion. Request for diagnostic and therapeutic thoracentesis. EXAM: ULTRASOUND GUIDED LEFT THORACENTESIS MEDICATIONS: 1% plain lidocaine , 5 mL COMPLICATIONS: None immediate. PROCEDURE: An ultrasound guided thoracentesis was thoroughly discussed with the patient and questions answered. The benefits, risks, alternatives and complications were also discussed. The patient understands and wishes to proceed with the procedure. Written consent was obtained. Ultrasound was performed to localize and mark an adequate pocket of fluid in the left chest. The area was then prepped and draped in the normal sterile fashion. 1% Lidocaine  was used for local anesthesia. Under ultrasound guidance a 6 Fr Safe-T-Centesis catheter was introduced. Thoracentesis was performed. The catheter was removed and a dressing applied. FINDINGS: A total of approximately 600 mL of clear, amber colored fluid was removed. Samples were sent to the laboratory as requested by the clinical team. IMPRESSION: Successful ultrasound guided left thoracentesis yielding 600 mL of pleural fluid. Procedure performed by Prudence Brown PA-C and supervised by Dr. Alyssa Jumper Electronically Signed   By: Melven Stable.  Shick M.D.   On: 09/16/2023 16:29    Scheduled Meds:  arformoterol   15 mcg Nebulization BID   Chlorhexidine  Gluconate Cloth  6 each Topical Daily   vitamin D3  1,000 Units Oral Daily   diltiazem   240 mg Oral Daily   FLUoxetine   20 mg Oral Daily   fluticasone   2 spray Each Nare Daily   guaiFENesin   600 mg Oral BID   liver oil-zinc  oxide   Topical TID   nystatin   5 mL Oral QID   revefenacin   175 mcg Nebulization Daily   sodium chloride  flush  10 mL Intrapleural Q8H   sodium chloride  flush  10-40 mL Intracatheter Q12H   sodium chloride  flush  3 mL Intravenous Q12H   sodium chloride   1 g Oral TID WC   Continuous Infusions:   LOS: 11 days   Time spent: 50 minutes  Unk Garb, DO  [facility]  09/18/2023, 10:19 AM

## 2023-09-18 NOTE — Progress Notes (Signed)
 OT Cancellation Note  Patient Details Name: Sara Barnes MRN: [id number] DOB: [birth year]   Cancelled Treatment:    Reason Eval/Treat Not Completed: Pain limiting ability to participate;Medical issues which prohibited therapy. OT attempted x4 to perform evaluation and unable to complete secondary to ongoing medical complications. OT spoke to pt and sister today; pt is experiencing pain from chest tube insertion site with RN/MD working on new pain management. OT to sign off at this time - pt and sister are in agreement, will notify RN when pt feels able to participate in therapy. Please re-consult at that time. Thank you for the referral.   [facility] L. Jawaun Celmer, OTR/L  09/18/23, 10:36 AM

## 2023-09-19 ENCOUNTER — Inpatient Hospital Stay (HOSPITAL_COMMUNITY)

## 2023-09-19 DIAGNOSIS — J910 Malignant pleural effusion: Secondary | ICD-10-CM | POA: Diagnosis not present

## 2023-09-19 DIAGNOSIS — R0603 Acute respiratory distress: Secondary | ICD-10-CM | POA: Diagnosis not present

## 2023-09-19 DIAGNOSIS — C50912 Malignant neoplasm of unspecified site of left female breast: Secondary | ICD-10-CM | POA: Diagnosis not present

## 2023-09-19 DIAGNOSIS — E871 Hypo-osmolality and hyponatremia: Secondary | ICD-10-CM | POA: Diagnosis not present

## 2023-09-19 DIAGNOSIS — J9601 Acute respiratory failure with hypoxia: Secondary | ICD-10-CM | POA: Diagnosis not present

## 2023-09-19 LAB — GLUCOSE, CAPILLARY
Glucose-Capillary: 102 mg/dL — ABNORMAL HIGH (ref 70–99)
Glucose-Capillary: 124 mg/dL — ABNORMAL HIGH (ref 70–99)
Glucose-Capillary: 108 mg/dL — ABNORMAL HIGH (ref 70–99)

## 2023-09-19 LAB — BASIC METABOLIC PANEL WITH GFR
Anion gap: 7 (ref 5–15)
BUN: 19 mg/dL (ref 8–23)
CO2: 24 mmol/L (ref 22–32)
Calcium: 7.7 mg/dL — ABNORMAL LOW (ref 8.9–10.3)
Chloride: 96 mmol/L — ABNORMAL LOW (ref 98–111)
Creatinine, Ser: 0.63 mg/dL (ref 0.44–1.00)
GFR, Estimated: >60 mL/min (ref >60)
Glucose, Bld: 103 mg/dL — ABNORMAL HIGH (ref 70–99)
Potassium: 4.5 mmol/L (ref 3.5–5.1)
Sodium: 127 mmol/L — ABNORMAL LOW (ref 135–145)

## 2023-09-19 MED ORDER — TALC (STERITALC) POWDER FOR INTRAPLEURAL USE
4.0000 g | Freq: Once | INTRAPLEURAL | Status: AC
  Administered 2023-09-19: 4 g via INTRAPLEURAL
  Filled 2023-09-19: qty 4

## 2023-09-19 MED ORDER — LIDOCAINE HCL 1 % IJ SOLN
10.0000 mL | Freq: Once | INTRAMUSCULAR | Status: AC
  Administered 2023-09-19: 10 mL via INTRAPLEURAL
  Filled 2023-09-19: qty 10

## 2023-09-19 MED ORDER — PROCHLORPERAZINE EDISYLATE 10 MG/2ML IJ SOLN
10.0000 mg | Freq: Once | INTRAMUSCULAR | Status: AC
  Administered 2023-09-19: 10 mg via INTRAVENOUS
  Filled 2023-09-19: qty 2

## 2023-09-19 MED ORDER — KETOROLAC TROMETHAMINE 30 MG/ML IJ SOLN
15.0000 mg | Freq: Once | INTRAMUSCULAR | Status: AC
  Administered 2023-09-19: 15 mg via INTRAVENOUS
  Filled 2023-09-19: qty 1

## 2023-09-19 MED ORDER — LACTATED RINGERS IV SOLN: INTRAVENOUS | Status: AC

## 2023-09-19 NOTE — Significant Event (Signed)
 Rapid Response Event Note   Reason for Call :  Shortness of breath  Initial Focused Assessment:  See attached flowsheet  Interventions:  Xanax  & Metoprolol , nebulizer all given by bedside RN. Patient has orders for BIPAP as needed. Patient refusing BIPAP at this time stating she wants her Xanax  to work so she doesn't feel claustrophobic. Patient currently on 15 L HFNC with saturations 90-92%. Chest tube is patent.    Plan of Care:  Patient to receive night meds and will utilize BIPAP tonight. Will remain in current level of care at this time.  Event Summary:   MD Notified: Denece Finger, NP Call Time: 2032 Arrival Time: 2045 End Time: 2110  Hobson Luna, RN

## 2023-09-19 NOTE — Progress Notes (Addendum)
 NAME:  Sara Barnes, MRN:  [id number], DOB:  [birth year], LOS: 12 ADMISSION DATE:  09/06/2023, CONSULTATION DATE:  5/13 REFERRING MD:  Jenell Mirza, CHIEF COMPLAINT:  wheezing, respiratory failure   History of Present Illness:  Sara Barnes is a 83 y.o. woman with a history of metastatic breast cancer who was admitted to the hospital with acute respiratory failure on 5/12 after developing shortness of breath, productive cough, wheezing on 5/11.  She had a fall with hip fracture in April and has been at rehab since then, making good progress.  At baseline for the last 2 years she has had activity limitation, but it is unclear if this is more due to fatigue or dyspnea on exertion.  This had been at baseline until her symptoms began acutely 2 days ago.  En route to the hospital on BiPAP she developed A-fib with RVR after receiving a breathing treatment.  She is out of A-fib today and remains off BiPAP.  She had an episode of worse shortness of breath this morning.  Family requesting pulmonology consult for wheezing.  No PTA inhalers.  No history of tobacco abuse.  Has a history of thoracentesis in January 2023> atypical cells but not confirmed malignant.  She reports that her symptoms now feel like when she had her thoracentesis in 2023.  No chronic antiplatelet or anticoagulant medications.  History is obtained with help of her sister due to patient being very hard of hearing.  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 Hip fracture 07/2023 Crohn's  Significant Hospital Events: Including procedures, antibiotic start and stop dates in addition to other pertinent events   5/12 admitted, started on azithromycin  and ceftriaxone  5/13 left thoracentesis with 800 cc clear yellow fluid removed 5/14 issues with pain and confusion overnight 5/20 1 repeat Thora 600 cc fluid removed 5/22 pigtail  Interim History / Subjective:   Complains of severe pain at pigtail  site Pigtail has drained 625 cc / 24 hours, 225 cc overnight  Objective    Blood pressure (!) 110/92, pulse (!) 128, temperature 98.3 F (36.8 C), temperature source Oral, resp. rate (!) 24, height 5\' 6"  (1.676 m), weight 78.9 kg, SpO2 94%.        Intake/Output Summary (Last 24 hours) at 09/19/2023 0930 Last data filed at 09/19/2023 0700 Gross per 24 hour  Intake 562.53 ml  Output 1175 ml  Net -612.47 ml   Filed Weights   09/06/23 2227  Weight: 78.9 kg    Examination: Chronically ill, frail, moderate distress due to pain Scattered bruising Ext warm Decreased breath sounds on left, no accessory muscle use, clear serous fluid in Pleur-evac S1-S2 regular  Chest x-ray reviewed shows extensive bilateral airspace disease, left pleural fluid appears to be clearing Labs show persistent hyponatremia, leukocytosis  Resolved problem list   Assessment and Plan  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  She does benefit from thoracenteses.   - Pigtail placed and this continues to drain significant amount of fluid. -She is in severe pain, I discussed that pleurodesis has optimal results once drainage decreased but due to her pain, she cannot wait any longer.  Barnes proceed with talc pleurodesis today then plan on DC pigtail hopefully in 24 hours -Doubt she Barnes be a candidate for Pleurx in the future   Acute pain -she reports urinary retention with Tylenol  so we  Barnes DC Barnes try to control pain with narcotics, Lidoderm  patch. Unfortunately NSAIDs are contraindicated due to chron's disease, try 1 dose of IV Toradol  prior to administering talc  Acute respiratory failure with hypoxia Bilateral infiltrates attributed to metapneumovirus pneumonia, not on antibiotics currently Consider adding antibiotics if she develops fever or leukocytosis persists  [name] MD.  [facility]. [facility] Pager : [phone]  If no response to pager , please call [phone] until 7 pm After 7:00 pm call Elink  [phone]     09/19/2023, 9:30 AM

## 2023-09-19 NOTE — Plan of Care (Signed)
   Problem: Activity: Goal: Ability to tolerate increased activity will improve Outcome: Progressing   Problem: Clinical Measurements: Goal: Ability to maintain a body temperature in the normal range will improve Outcome: Progressing

## 2023-09-19 NOTE — Progress Notes (Signed)
 Called to assess Pt due to low sp02 of 84% arrived to room Pt awake alert and responding to questions Pt found on 8L HFNC Nursing staff at bedside  02 increased to 15L sp02 instantly increased to 94%. Nursing staff aware and okay with the sats Pt states she's anxious and trying to calm down Pt left in NARD family at bedside . Goal is to wean 02 as tolerated

## 2023-09-19 NOTE — Progress Notes (Signed)
 PROGRESS NOTE    Sara Barnes  ZOX:[id number] DOB: 1941/03/25 DOA: 09/06/2023 PCP: Estill Hemming, DO  Subjective: Pt seen and examined. Pt's sister Sara Barnes at bedside. Pt back up to 8 L/min. Having pain on left side from chest tube.  I spoke to pt's sister Sara Barnes outside of pt's room. Discussed pt's overall very poor prognosis. I discussed with Sara Barnes my concern about how weak pt was and the likelihood that pt only had a few weeks to few months left to live.  Pt still have 550 ml in chest tube output yesterday.   Hospital Course: HPI: Sara Barnes is a 83 y.o. 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   During discharge from the hospital patient being placed on IV Lovenox  40 mg daily for DVT prophylaxis.       ED Course:  At presentation to ED patient found tachycardic heart rate 162, hypertensive, tachypneic and O2 sat 100% on 8 L oxygen.  Currently requiring BiPAP. ED ED O2 sat dropped to 80% on room air.  Initially placed on 3 L oxygen however requiring more oxygen so patient has been placed on BiPAP.  In the ED patient has been started on Cardizem  drip and IV heparin  drip.   For the management of sepsis patient received 2 L of LR bolus, azithromycin  and ceftriaxone .   Dr. Kermit Ped reported that even though patient  has been pretreated with Benadryl  and   Of note, chart review previous CT abdomen pelvis from 01/16/2023 no evidence of hepatic hematoma anymore stable hepatic and osseous metastatic disease progression.   Given patient has allergic reaction with IV contrast discussed case with PCCM Dr. Charon Copper who recommended to give IV Decadron /Solu-Medrol , continue IV Benadryl  and IV famotidine .  History of allergic reaction/hives with prednisone .  Discussed with PCCM reviewed and clarified that it is not uncommon that patient is allergic to prednisone  however it is safe to give Decadron  or Solu-Medrol . Also unable to give epinephrine  shot in the setting of A-fib RVR currently on Cardizem  drip.   Discussed case with cardiology Dr.Bandarage who recommended continue the Cardizem  drip and heparin  drip.  Cardiology will see patient in the daytime.   Hospitalist has been consulted for further management of allergic reaction with contrast, A-fib RVR, sepsis due to pneumonia, acute metabolic encephalopathy and acute hypoxic respiratory failure.  Significant Events: Admitted 09/06/2023 for respiratory distress   Admission Labs: Na 130, K 2.8, CO2 of 23, BUN 7, Scr 0.8. glu 136 WBC 6.0, HgB 11.0, plt 278 INR 1.0 Lactic 2.1 Covid/RSV/flu negative BNP 531  Admission Imaging Studies: CXR Marked severity left basilar atelectasis and/or infiltrate. 2. Small left pleural effusion CTPA No evidence of pulmonary embolism. 2. Small patchy right apical and anterior right upper lobe infiltrates. 3. Small right pleural effusion with a moderate size left pleural effusion. 4. Small, stable anterior mediastinal lymph node which corresponds to a hypermetabolic focus seen on the prior nuclear medicine PET/CT. 5.  Findings consistent with osseous metastatic disease, as described above. 6. Small hiatal hernia. 7. Aortic atherosclerosis.  Antibiotic Therapy: Anti-infectives (From admission, onward)    Start     Dose/Rate Route  Frequency Ordered Stop   09/08/23 0800  Vancomycin  (VANCOCIN ) 1,250 mg in sodium chloride  0.9 % 250 mL IVPB  Status:  Discontinued        1,250 mg 166.7 mL/hr over 90 Minutes Intravenous Every 24 hours 09/07/23 0501 09/08/23 0920   09/07/23 0500  ceFEPIme  (MAXIPIME ) 2 g in sodium chloride  0.9 % 100 mL IVPB  Status:  Discontinued        2 g 200 mL/hr over 30 Minutes Intravenous Every 12 hours 09/07/23 0452 09/09/23 1152   09/07/23 0500  vancomycin  (VANCOREADY) IVPB 1500 mg/300 mL        1,500 mg 150 mL/hr over 120 Minutes Intravenous  Once 09/07/23 0452 09/07/23 1040   09/06/23 2300  cefTRIAXone  (ROCEPHIN ) 1 g in sodium chloride  0.9 % 100 mL IVPB        1 g 200 mL/hr over 30 Minutes Intravenous  Once 09/06/23 2246 09/06/23 2344   09/06/23 2300  azithromycin  (ZITHROMAX ) 500 mg in sodium chloride  0.9 % 250 mL IVPB        500 mg 250 mL/hr over 60 Minutes Intravenous  Once 09/06/23 2246 09/07/23 0044       Procedures: 09-08-2023 thoracentesis  Consultants: Cardiology Palliative care PCCM    Assessment and Plan: * Respiratory distress-resolved as of 09/18/2023 Prior to 09-16-2023 - Tested positive for metapneumovirus - Sepsis present on admission - Repeat chest x-ray reviewed independently.  Await formal radiology report, but on my read does seem more fluid overloaded than previous.  Ordered 1 dose IV Lasix  today, also back on IV Solu-Medro  09-16-2023 resolved. Respiratory distress  Acute respiratory failure with hypoxia (HCC) 09-16-2023 multifactorial. Likely a combination of bilateral pleural effusion L >R, rapid afib, volume overload, viral pneumonia. Currently on 7 L/min.  09-17-2023 likely due to viral pneumonia, malignant pleural effusion and cancer mets.  09-18-2023 wean O2 to 2 L/min. Will obtain home O2 eval on Monday.  09-19-2023 worsening today. Pt remains full code.  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 Bedside thoracic U/S today shows moderate/large left effusion. Estimated [phone]. Pt agreeable to having IR perform another thoracentesis. I think this will help her oxygen requirements.  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 With the effusion returning in just 7 days, this is an extremely poor prognosis. Have contacted pt's oncologist. Pt may be a candidate for Pleurx catheter placement by either PCCM or IR. May need to hold Eliquis . Also will need to talk to PCCM vs IR about their thoughts about placing Pleurx Catheter in a patient that is still a full code given the risk of infection for a long term chest tube.  Pt and her sister have been resistant to talk to palliative care. However, given that her breast cancer has spread to her pleural, this changes her overall prognosis significantly.  Discussed with pt and her sister Sara Barnes that she had malignant left side pleural effusion. Discussed that this means her cancer has spread to the lung or pleural lining. Discussed that she will have continued/recurrent effusion.  Discussed that her overall prognosis was poor given malignant pleural effusion. Discussed management options regarding her pleural effusion.  Have enlisted assistance of PCCM for Pleurx catheter placement.  Will  hold eliquis  after this AM dose. PCCM plans on 48 hour eliquis  washout and plan for Pleurx catheter placement on Monday.  Pt's oncologist aware of pt's malignant pleural effusion.  09-18-2023 pt has about 400 ml in chest atrium this AM.  Pt had left side pigtail catheter placed into pleural space yesterday by PCCM in hopes to attempt talc pleurodesis today. If this fails, then pt will get pleurx catheter.   I think pt and her sister have unrealistic expectation on pt's overall prognosis of her metastatic cancer. Oncology following. Pt and sister resistant to palliative care attempts on goals of care discussion.  Pt is  refusing to go to SNF and wants to go home with pt's elderly sister to provide for her care.   09-19-2023 I spoke to pt's sister Sara Barnes outside of pt's room. Discussed pt's overall very poor prognosis. I discussed with Sara Barnes my concern about how weak pt was and the likelihood that pt only had a few weeks to few months left to live. PCCM managing chest tube and pleural effusion. Not sure if talc pleurodesis is going to work.  Pt looks worse today.  Human metapneumovirus (hMPV) pneumonia-resolved as of 09/16/2023 Prior to 09-16-2023 - Tested positive for metapneumovirus - Sepsis present on admission - Repeat chest x-ray reviewed independently.  Await formal radiology report, but on my read does seem more fluid overloaded than previous.  Ordered 1 dose IV Lasix  today, also back on IV Solu-Medrol   09-16-2023 resolved. 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 Pt has been on IV solumedrol, therefore checking cortisol level will not be useful. Echo showed EF 70%. Pt seem volume overloaded. May need to use Samsca to get rid of free water instead of diuretics.  09-17-2023 will cancel workup of hyponatremia given pt's malignant pleural effusion. Her overall prognosis is quite poor.  09-19-2023 Na 127.  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  09-18-2023 I think pt and her sister have unrealistic expectation on pt's overall prognosis of her metastatic cancer. Oncology following. Pt and sister resistant to palliative care attempts on goals of care discussion. Pt is refusing to go to SNF and wants to go home with pt's elderly sister to provide for her care.   09-19-2023 I spoke to pt's sister Sara Barnes outside of pt's room. Discussed pt's overall very poor prognosis. I discussed with Sara Barnes  my concern about how weak pt was and the likelihood that pt only had a few weeks to few months left to live. PCCM managing chest tube and pleural effusion. Not sure if talc pleurodesis is going to work.  Pt looks worse today.  Pt remains a full code.  Paroxysmal A-fib (HCC) Prior to 09-16-2023. Pt had rapid afib.  Now off Cardizem  drip and on oral Cardizem  - No hematoma seen on CT.  This was discussed with patient and sister.  Started on Eliquis   09-16-2023 continue cardizem  CD and Eliquis .  09-17-2023 continue cardizem  CD. Holding Eliquis  after this AM dose for pleurx Catheter placement by PCCM. Hopefully on Monday.  09-18-2023 currently off Eliquis  per PCCM. Continue cardizem  CD.  09-19-2023 remains off Eliquis  per PCCM. They will need to restart anticoagulation when they feel it is appropriate. For now, SCDs.  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  Acute metabolic encephalopathy-resolved as of 09/17/2023 09-16-2023 multifactorial. Likely due to viral pneumonia, hypoxia, hyponatremia. Now resolved. Pt is AxOx4. Pt is very hard of hearing.  Allergic reaction to contrast-resolved as of 09/16/2023 On admission.  - In the ED patient has been pretreated with Benadryl  and famotidine  and already received Solu-Medrol  125 mg before coming to the ED still after contrast exposure patient developed itchiness and wheezing.  Received second dose of IV Solu-Medrol , Benadryl  and famotidine  in the ED.   -After discussion with PCCM recommended to continue the BiPAP, continue IV Solu-Medrol  or Decadron , IV Benadryl  and IV famotidine  as scheduled. Unable to give epinephrine  shot given patient has A-fib RVR. -Continue IV Solu-Medrol  40 mg twice daily, iv Benadryl  25 mg 3 times daily and iv famotidine  20 mg twice daily. -Continue ipratropium every 6 hours scheduled.  Avoid  albuterol  and levalbuterol  as it provokes A-fib RVR. -Per discussion with patient's sister at the bedside patient is full code  09-16-2023 resolved      DVT prophylaxis: Place and maintain sequential compression device Start: 09/19/23 1211 SCDs Start: 09/07/23 0451 Place TED hose Start: 09/07/23 0451    Code Status: Full Code Family Communication: discussed with pt's sister Sara Barnes Disposition Plan: unknown Reason for continuing need for hospitalization: remains with chest tube, on high flow O2  Objective: Vitals:   09/18/23 2046 09/19/23 0225 09/19/23 0851 09/19/23 0902  BP:  (!) 110/92    Pulse: 99 (!) 128    Resp: (!) 24     Temp:  98.3 F (36.8 C)    TempSrc:  Oral    SpO2: 93% 92% (!) 84% 94%  Weight:      Height:        Intake/Output Summary (Last 24 hours) at 09/19/2023 1214 Last data filed at 09/19/2023 0700 Gross per 24 hour  Intake 507.28 ml  Output 1175 ml  Net -667.72 ml   Filed Weights   09/06/23 2227  Weight: 78.9 kg    Examination:  Physical Exam Vitals and nursing note reviewed.  Constitutional:      General: She is not in acute distress.    Appearance: She is ill-appearing.     Comments: Appears ill  HENT:     Head: Normocephalic and atraumatic.     Nose: Nose normal.  Cardiovascular:     Rate and Rhythm: Tachycardia present. Rhythm irregular.  Pulmonary:     Effort: Respiratory distress present.     Breath sounds: Wheezing present.  Abdominal:     General: Abdomen is flat.  Skin:    General: Skin is warm and dry.     Capillary Refill: Capillary refill takes less than 2 seconds.  Neurological:     Mental Status: She is disoriented.     Data Reviewed: I have personally reviewed following labs and imaging studies  CBC: Recent Labs  Lab 09/13/23 0500 09/14/23 0336 09/15/23 0436 09/16/23 0326 09/18/23 1237  WBC 12.3* 13.9* 15.4* 16.8* 26.0*  NEUTROABS  --   --   --   --  23.9*  HGB 9.4* 9.4* 10.0* 10.9* 11.2*  HCT 29.0* 29.8*  31.9* 33.6* 36.1  MCV 94.2 95.8 95.2 93.1 97.3  PLT 239 238 270 293 316   Basic Metabolic Panel: Recent Labs  Lab 09/14/23 0336 09/15/23 0436 09/16/23 0326 09/18/23 1237 09/19/23 0412  NA 127* 130* 129* 127* 127*  K 4.5 4.3 4.0 4.0 4.5  CL 94* 97* 95* 95* 96*  CO2 24 25 26 23 24   GLUCOSE 117* 97 128* 101* 103*  BUN 28* 22 21 24* 19  CREATININE 0.62 0.53 0.45 0.83 0.63  CALCIUM  8.1* 8.0* 8.1* 7.9* 7.7*  MG  --   --   --  1.9  --    GFR: Estimated Creatinine Clearance: 58.4 mL/min (by C-G formula based on SCr of 0.63 mg/dL). BNP (last 3 results) Recent Labs    09/07/23 0532  BNP 531.6*   CBG: Recent Labs  Lab 09/17/23 1754 09/18/23 0017 09/18/23 0606 09/18/23 1222 09/19/23 0157  GLUCAP 144* 97 94 111* 102*    Recent Results (from the past 240 hours)  Body fluid culture w Gram Stain     Status: None (Preliminary result)   Collection Time: 09/16/23  3:34 PM   Specimen: PATH Cytology Pleural fluid  Result Value Ref Range Status   Specimen Description   Final    PLEURAL Performed at [facility], [address], [place], [place] [address]    Special Requests   Final    NONE Performed at [facility], [address] [place], [place] [address]    Gram Stain   Final    FEW WBC PRESENT, PREDOMINANTLY MONONUCLEAR NO ORGANISMS SEEN    Culture   Final    NO GROWTH 3 DAYS Performed at [facility], [address], [place], [place] [address]    Report Status PENDING  Incomplete     Radiology Studies: DG CHEST PORT 1 VIEW Result Date: 09/19/2023 CLINICAL DATA:  Left pleural effusion. EXAM: PORTABLE CHEST 1 VIEW COMPARISON:  09/18/2023 FINDINGS: Right upper extremity PICC remains in place. Left-sided pigtail coiled medially in the mid lower hemithorax. Unchanged opacity throughout the left mid and lower hemithorax. No visible pneumothorax. Diminished right pleural effusion. Multifocal airspace disease throughout the right hemithorax is  otherwise unchanged. No right pneumothorax. Mediastinal contours are obscured by bilateral airspace disease. IMPRESSION: 1. Left-sided pigtail coiled medially in the mid lower hemithorax. Unchanged opacity throughout the left mid and lower hemithorax. 2. Diminished right pleural effusion. Multifocal airspace disease throughout the right hemithorax is otherwise unchanged. Electronically Signed   By: Chadwick Colonel M.D.   On: 09/19/2023 11:29   DG Chest Port 1 View Result Date: 09/18/2023 CLINICAL DATA:  Chest tube.  Pleural effusion EXAM: PORTABLE CHEST 1 VIEW COMPARISON:  09/17/2023 FINDINGS: Bilateral airspace disease. Small right pleural effusion with mild increase. No pneumothorax. Right PICC with tip at the upper right atrium. A drain projects over the left chest. With unchanged clamping or mild kink. IMPRESSION: Mild increase in right pleural fluid. Similar degree of extensive bilateral airspace disease. Electronically Signed   By: Ronnette Coke M.D.   On: 09/18/2023 09:46   DG Chest Port 1 View Result Date: 09/17/2023 CLINICAL DATA:  Chest tube EXAM: PORTABLE CHEST 1 VIEW COMPARISON:  09/16/2023, 09/15/2023, 09/08/2023 FINDINGS: Right upper extremity central venous catheter tip over the cavoatrial region. New left-sided chest drainage catheter projects over the medial lower left chest. Decreased left effusion. Enlarged cardiomediastinal silhouette. Small right pleural effusion. Widespread heterogeneous bilateral airspace disease appears slightly worse. No pneumothorax. IMPRESSION: 1. New left-sided chest drainage catheter with decreased left effusion. No pneumothorax. 2. Widespread heterogeneous bilateral airspace disease appears slightly worse. Small right pleural effusion. Electronically Signed   By: Esmeralda Hedge M.D.   On: 09/17/2023 16:53    Scheduled Meds:  arformoterol   15 mcg  Nebulization BID   Chlorhexidine  Gluconate Cloth  6 each Topical Daily   vitamin D3  1,000 Units Oral Daily    diltiazem   240 mg Oral Daily   FLUoxetine   20 mg Oral Daily   fluticasone   2 spray Each Nare Daily   guaiFENesin   600 mg Oral BID   lidocaine   1 patch Transdermal Q24H   liver oil-zinc  oxide   Topical TID   nystatin   5 mL Oral QID   revefenacin   175 mcg Nebulization Daily   sodium chloride  flush  10 mL Intrapleural Q8H   sodium chloride  flush  10-40 mL Intracatheter Q12H   sodium chloride  flush  3 mL Intravenous Q12H   sodium chloride   1 g Oral TID WC   Continuous Infusions:  lactated ringers        LOS: 12 days   Time spent: 50 minutes  Unk Garb, DO  [facility]  09/19/2023, 12:14 PM

## 2023-09-19 NOTE — Plan of Care (Signed)
  Problem: Activity: Goal: Ability to tolerate increased activity will improve 09/19/2023 0743 by Prudy Brownie, RN Outcome: Progressing 09/19/2023 0538 by Prudy Brownie, RN Outcome: Progressing   Problem: Clinical Measurements: Goal: Ability to maintain a body temperature in the normal range will improve 09/19/2023 0743 by Prudy Brownie, RN Outcome: Progressing 09/19/2023 0538 by Prudy Brownie, RN Outcome: Progressing   Problem: Respiratory: Goal: Ability to maintain adequate ventilation will improve Outcome: Progressing Goal: Ability to maintain a clear airway will improve Outcome: Progressing

## 2023-09-19 NOTE — Progress Notes (Signed)
 Pt awake alert and oriented discussed applying NIV to  Patient and she became more anxious and refused RN at bedside and talked with Pt and she still refused. Pt states  "let my Xanax  work" NIV unit remain at bedside Pt remain on HFNC 15L  sp02 97% goal is to wean as tolerated Pt left in NARD

## 2023-09-19 NOTE — Progress Notes (Signed)
 Pt assessed and sp02  86% Pt is more relaxed  and mouth breathing at this time. NRB mask placed sp02 increased instantly to 90%. Pt resting with eyes closed but is easily aroused. Pt continue to refuse BIPAP at this time. BIPAP remain in room at bedside. RN , nursing staff ,and family at bedside. RN notified and aware of the plan to wean 02 back to HFNC as tolerated. HFNC remain connected on 15L . Pt left in NARD

## 2023-09-20 ENCOUNTER — Inpatient Hospital Stay (HOSPITAL_COMMUNITY)

## 2023-09-20 DIAGNOSIS — C50912 Malignant neoplasm of unspecified site of left female breast: Secondary | ICD-10-CM | POA: Diagnosis not present

## 2023-09-20 DIAGNOSIS — R339 Retention of urine, unspecified: Secondary | ICD-10-CM | POA: Diagnosis not present

## 2023-09-20 DIAGNOSIS — E871 Hypo-osmolality and hyponatremia: Secondary | ICD-10-CM | POA: Diagnosis not present

## 2023-09-20 DIAGNOSIS — Z7189 Other specified counseling: Secondary | ICD-10-CM | POA: Diagnosis not present

## 2023-09-20 DIAGNOSIS — J9601 Acute respiratory failure with hypoxia: Secondary | ICD-10-CM | POA: Diagnosis not present

## 2023-09-20 DIAGNOSIS — J910 Malignant pleural effusion: Secondary | ICD-10-CM | POA: Diagnosis not present

## 2023-09-20 DIAGNOSIS — Z66 Do not resuscitate: Secondary | ICD-10-CM

## 2023-09-20 LAB — GLUCOSE, CAPILLARY
Glucose-Capillary: 79 mg/dL (ref 70–99)
Glucose-Capillary: 81 mg/dL (ref 70–99)
Glucose-Capillary: 86 mg/dL (ref 70–99)
Glucose-Capillary: 96 mg/dL (ref 70–99)

## 2023-09-20 LAB — BODY FLUID CULTURE W GRAM STAIN: Culture: NO GROWTH

## 2023-09-20 MED ORDER — HYDROMORPHONE HCL 1 MG/ML IJ SOLN
1.0000 mg | INTRAMUSCULAR | Status: DC | PRN
  Administered 2023-09-20 – 2023-09-21 (×5): 1 mg via INTRAVENOUS
  Filled 2023-09-20 (×5): qty 1

## 2023-09-20 MED ORDER — PHENOL 1.4 % MT LIQD
1.0000 | OROMUCOSAL | Status: DC | PRN
  Filled 2023-09-20: qty 177

## 2023-09-20 MED ORDER — LORAZEPAM 2 MG/ML IJ SOLN
1.0000 mg | INTRAMUSCULAR | Status: DC | PRN
Start: 2023-09-20 — End: 2023-09-22
  Administered 2023-09-21 (×4): 1 mg via INTRAVENOUS
  Filled 2023-09-20 (×4): qty 1

## 2023-09-20 MED ORDER — HYDROMORPHONE HCL 1 MG/ML IJ SOLN
1.0000 mg | INTRAMUSCULAR | Status: DC | PRN
  Administered 2023-09-20: 1 mg via INTRAVENOUS
  Filled 2023-09-20: qty 1

## 2023-09-20 NOTE — Progress Notes (Signed)
       Overnight   NAME: Sara Barnes MRN: [id number] DOB : 10-14-1940    Date of Service   09/20/2023   HPI/Events of Note    Notified by RN for Rapid Response.  Patient was escalated to 15 lpm HFNC. SPO2 +/- 90%   BiPAP ordered. Patient refuses BiPAP until Medications are working. (Claustrophobia,Meds were given recently by Nursing)  She does state some relief starting at time of bedside visit. She states will use BiPAP as able.     Interventions/ Plan   Meds for anxiety BiPAP PRN Continue Attending orders.      Denece Finger BSN MSNA MSN ACNPC-AG Acute Care Nurse Practitioner [facility]

## 2023-09-20 NOTE — Progress Notes (Signed)
 Order placed to remove chest tube. Left chest tube removed and occlusive dressing placed over site. SpO2 90-94 on 15L HFNC and NRB, tachypnea RR 30s prior to removal. Family aware of chest tube removal.

## 2023-09-20 NOTE — Assessment & Plan Note (Signed)
 09-20-2023 Talked with pt's sister Sara Barnes and pt's son Sara Barnes. Pt unable to make medical decisions for herself. Family has decided to make pt DNR/DNI. They will think about comfort care today and let me know.   Code status changed to DNR/DNI.  2023/10/03 pt really needs to be comfort care. Dr. Villa Greaser to talk to family again today.

## 2023-09-20 NOTE — Progress Notes (Signed)
 Chest tube removed by ICU RN. Patient O2 Sats now 86-90% on 15L HFNC and NRB. RR remain 30-40s with accessory muscle use. Family at bedside. MD Farrel Hones made aware.

## 2023-09-20 NOTE — Plan of Care (Signed)
  Problem: Activity: Goal: Ability to tolerate increased activity will improve 09/20/2023 0708 by Prudy Brownie, RN Outcome: Progressing 09/20/2023 0708 by Prudy Brownie, RN Outcome: Progressing 09/20/2023 0708 by Prudy Brownie, RN Outcome: Progressing   Problem: Clinical Measurements: Goal: Ability to maintain a body temperature in the normal range will improve 09/20/2023 0708 by Prudy Brownie, RN Outcome: Progressing 09/20/2023 0708 by Prudy Brownie, RN Outcome: Progressing 09/20/2023 0708 by Prudy Brownie, RN Outcome: Progressing   Problem: Respiratory: Goal: Ability to maintain adequate ventilation will improve 09/20/2023 0708 by Prudy Brownie, RN Outcome: Progressing 09/20/2023 0708 by Prudy Brownie, RN Outcome: Progressing Goal: Ability to maintain a clear airway will improve Outcome: Progressing

## 2023-09-20 NOTE — Progress Notes (Signed)
 Patient is currently on a 100% NRB plus a 15 L [place]. Tries taking off the NRB to do a trial oxygen saturation and her oxygen saturation dropped to 82% while on breathing treatment plus 15L [place]. Patient placed back on the 100% NRB plus 15L [place]. Saturation came back up to 96%. Patient is refusing tom wear BIPAP at this point. She said enough!

## 2023-09-20 NOTE — Progress Notes (Addendum)
 PROGRESS NOTE    Sara Barnes  ZOX:[id number] DOB: Oct 07, 1940 DOA: 09/06/2023 PCP: Estill Hemming, DO  Subjective: Pt seen and examined. Pt's sister Sara Barnes and pt's son Sara Barnes at bedside.  Pt with increasing respiratory failure. Now on 100% NRB with 15 L/min [place].  Had long discussion with son and sister outside pt's room. They have decided to make pt DNR/DNI. I made son aware that in my best estimation, pt will not survive this hospitalization. Son and sister do not want pt to know about change in code status. They are considering changing to terminal care/comfort care strategy.  RN states pt did have talc pleurodesis performed by pulmonology yesterday.   Hospital Course: HPI: Sara Barnes is a 83 y.o. 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   During discharge from the hospital patient being placed on IV Lovenox  40 mg daily for DVT prophylaxis.       ED Course:  At presentation to ED patient found tachycardic heart rate 162, hypertensive, tachypneic and O2 sat 100% on 8 L oxygen.  Currently requiring BiPAP. ED ED O2 sat dropped to 80% on room air.  Initially placed on 3 L oxygen however requiring more oxygen so patient has been placed on BiPAP.  In the ED patient has been started on Cardizem  drip and IV heparin   drip.   For the management of sepsis patient received 2 L of LR bolus, azithromycin  and ceftriaxone .   Dr. Kermit Ped reported that even though patient has been pretreated with Benadryl  and   Of note, chart review previous CT abdomen pelvis from 01/16/2023 no evidence of hepatic hematoma anymore stable hepatic and osseous metastatic disease progression.   Given patient has allergic reaction with IV contrast discussed case with PCCM Dr. Charon Copper who recommended to give IV Decadron /Solu-Medrol , continue IV Benadryl  and IV famotidine .  History of allergic reaction/hives with prednisone .  Discussed with PCCM reviewed and clarified that it is not uncommon that patient is allergic to prednisone  however it is safe to give Decadron  or Solu-Medrol . Also unable to give epinephrine  shot in the setting of A-fib RVR currently on Cardizem  drip.   Discussed case with cardiology Dr.Bandarage who recommended continue the Cardizem  drip and heparin  drip.  Cardiology will see patient in the daytime.   Hospitalist has been consulted for further management of allergic reaction with contrast, A-fib RVR, sepsis due to pneumonia, acute metabolic encephalopathy and acute hypoxic respiratory failure.  Significant Events: Admitted 09/06/2023 for respiratory distress   Admission Labs: Na 130, K 2.8, CO2 of 23, BUN 7, Scr 0.8. glu 136 WBC 6.0, HgB 11.0, plt 278 INR 1.0 Lactic 2.1 Covid/RSV/flu negative BNP 531  Admission Imaging Studies: CXR Marked severity left basilar atelectasis and/or infiltrate. 2. Small left pleural effusion CTPA No evidence of pulmonary embolism. 2. Small patchy right apical and anterior right upper lobe infiltrates. 3. Small right pleural effusion with a moderate size left pleural effusion. 4. Small, stable anterior  mediastinal lymph node which corresponds to a hypermetabolic focus seen on the prior nuclear medicine PET/CT. 5. Findings consistent with osseous metastatic disease, as described above.  6. Small hiatal hernia. 7. Aortic atherosclerosis.  Antibiotic Therapy: Anti-infectives (From admission, onward)    Start     Dose/Rate Route Frequency Ordered Stop   09/08/23 0800  Vancomycin  (VANCOCIN ) 1,250 mg in sodium chloride  0.9 % 250 mL IVPB  Status:  Discontinued        1,250 mg 166.7 mL/hr over 90 Minutes Intravenous Every 24 hours 09/07/23 0501 09/08/23 0920   09/07/23 0500  ceFEPIme  (MAXIPIME ) 2 g in sodium chloride  0.9 % 100 mL IVPB  Status:  Discontinued        2 g 200 mL/hr over 30 Minutes Intravenous Every 12 hours 09/07/23 0452 09/09/23 1152   09/07/23 0500  vancomycin  (VANCOREADY) IVPB 1500 mg/300 mL        1,500 mg 150 mL/hr over 120 Minutes Intravenous  Once 09/07/23 0452 09/07/23 1040   09/06/23 2300  cefTRIAXone  (ROCEPHIN ) 1 g in sodium chloride  0.9 % 100 mL IVPB        1 g 200 mL/hr over 30 Minutes Intravenous  Once 09/06/23 2246 09/06/23 2344   09/06/23 2300  azithromycin  (ZITHROMAX ) 500 mg in sodium chloride  0.9 % 250 mL IVPB        500 mg 250 mL/hr over 60 Minutes Intravenous  Once 09/06/23 2246 09/07/23 0044       Procedures: 09-08-2023 thoracentesis  Consultants: Cardiology Palliative care PCCM    Assessment and Plan: * Respiratory distress-resolved as of 09/18/2023 Prior to 09-16-2023 - Tested positive for metapneumovirus - Sepsis present on admission - Repeat chest x-ray reviewed independently.  Await formal radiology report, but on my read does seem more fluid overloaded than previous.  Ordered 1 dose IV Lasix  today, also back on IV Solu-Medro  09-16-2023 resolved. Respiratory distress  DNR (do not resuscitate)/DNI(Do Not Intubate) 09-20-2023 Talked with pt's sister Sara Barnes and pt's son Sara Barnes. Pt unable to make medical decisions for herself. Family has decided to make pt DNR/DNI. They will think about comfort care today and let me know.   Code status changed to DNR/DNI.  Acute respiratory failure with hypoxia (HCC) 09-16-2023  multifactorial. Likely a combination of bilateral pleural effusion L >R, rapid afib, volume overload, viral pneumonia. Currently on 7 L/min.  09-17-2023 likely due to viral pneumonia, malignant pleural effusion and cancer mets.  09-18-2023 wean O2 to 2 L/min. Will obtain home O2 eval on Monday.  09-19-2023 worsening today. Pt remains full code.  09-20-2023 pt changed to DNR/DNI status by family. Now on 100% NRB and 15 L/min high flow cannula.  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 Bedside thoracic U/S today shows moderate/large left effusion. Estimated [phone]. Pt agreeable to having IR perform another thoracentesis. I think this will help her oxygen requirements.  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 With the effusion returning in just 7 days, this is an extremely poor prognosis. Have contacted pt's oncologist. Pt may be a candidate for Pleurx catheter placement by either PCCM or IR. May need to hold Eliquis . Also will need to talk to PCCM vs IR about their thoughts about placing Pleurx Catheter in a patient that is still a full code given the risk of infection for a long term chest tube.  Pt and her sister have been resistant to talk to palliative  care. However, given that her breast cancer has spread to her pleural, this changes her overall prognosis significantly.  Discussed with pt and her sister Sara Barnes that she had malignant left side pleural effusion. Discussed that this means her cancer has spread to the lung or pleural lining. Discussed that she will have continued/recurrent effusion.  Discussed that her overall prognosis was poor given malignant pleural effusion. Discussed management options regarding her pleural effusion.  Have enlisted assistance of PCCM for Pleurx catheter placement.  Will hold eliquis  after this AM dose. PCCM plans on 48 hour eliquis  washout and  plan for Pleurx catheter placement on Monday.  Pt's oncologist aware of pt's malignant pleural effusion.  09-18-2023 pt has about 400 ml in chest atrium this AM.  Pt had left side pigtail catheter placed into pleural space yesterday by PCCM in hopes to attempt talc pleurodesis today. If this fails, then pt will get pleurx catheter.   I think pt and her sister have unrealistic expectation on pt's overall prognosis of her metastatic cancer. Oncology following. Pt and sister resistant to palliative care attempts on goals of care discussion.  Pt is refusing to go to SNF and wants to go home with pt's elderly sister to provide for her care.   09-19-2023 I spoke to pt's sister Sara Barnes outside of pt's room. Discussed pt's overall very poor prognosis. I discussed with Sara Barnes my concern about how weak pt was and the likelihood that pt only had a few weeks to few months left to live. PCCM managing chest tube and pleural effusion. Not sure if talc pleurodesis is going to work.  Pt looks worse today.  09-20-2023  RN states pt did have talc pleurodesis performed by pulmonology yesterday.   Son and pt's sister have changed pt's code status to DNR/DNI.  I think pt is starting active dying phase. Family to decide on comfort care measures. Her CXR from this AM looks terrible. I don't think pt has long to live.  Human metapneumovirus (hMPV) pneumonia-resolved as of 09/16/2023 Prior to 09-16-2023 - Tested positive for metapneumovirus - Sepsis present on admission - Repeat chest x-ray reviewed independently.  Await formal radiology report, but on my read does seem more fluid overloaded than previous.  Ordered 1 dose IV Lasix  today, also back on IV Solu-Medrol   09-16-2023 resolved. 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 Pt has been on IV solumedrol,  therefore checking cortisol level will not be useful. Echo showed EF 70%. Pt seem volume overloaded. May need to use [place] to get rid of free water instead of diuretics.  09-17-2023 will cancel workup of hyponatremia given pt's malignant pleural effusion. Her overall prognosis is quite poor.  09-19-2023 Na 127.  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  09-18-2023 I think pt and her sister have unrealistic expectation on pt's overall prognosis of her metastatic cancer. Oncology following. Pt and sister resistant to palliative care attempts on goals of care discussion. Pt is refusing to go to SNF and wants to go home with pt's elderly sister to provide for her care.   09-19-2023 I spoke to pt's sister Sara Barnes outside of pt's room. Discussed pt's overall very poor prognosis. I discussed with Sara Barnes my concern about how weak pt was and the likelihood that pt only had a few weeks  to few months left to live. PCCM managing chest tube and pleural effusion. Not sure if talc pleurodesis is going to work.  Pt looks worse today.  Pt remains a full code.  09-20-2023 son and pt's sister aware of pt's terminal prognosis. I think pt is actively dying given her increased O2 requirements, rapid afib, etc. Pt's code status change to DNR/DNI. Family to decide on comfort care measures today.  Paroxysmal A-fib (HCC) Prior to 09-16-2023. Pt had rapid afib.  Now off Cardizem  drip and on oral Cardizem  - No hematoma seen on CT.  This was discussed with patient and sister.  Started on Eliquis   09-16-2023 continue cardizem  CD and Eliquis .  09-17-2023 continue cardizem  CD. Holding Eliquis  after this AM dose for pleurx Catheter placement by PCCM. Hopefully on Monday.  09-18-2023 currently off Eliquis  per PCCM. Continue cardizem  CD.  09-19-2023 remains off Eliquis  per PCCM. They will need to restart anticoagulation when they feel it is appropriate. For now,  SCDs.  09-20-2023 in rapid afib this AM. I think pt is actively dying. Awaiting family to decide about comfort care measures. Will hold off in IV cardizem  infusion for now.  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  Acute metabolic encephalopathy-resolved as of 09/17/2023 09-16-2023 multifactorial. Likely due to viral pneumonia, hypoxia, hyponatremia. Now resolved. Pt is AxOx4. Pt is very hard of hearing.  Allergic reaction to contrast-resolved as of 09/16/2023 On admission.  - In the ED patient has been pretreated with Benadryl  and famotidine  and already received Solu-Medrol  125 mg before coming to the ED still after contrast exposure patient developed itchiness and wheezing.  Received second dose of IV Solu-Medrol , Benadryl  and famotidine  in the ED.   -After discussion with PCCM recommended to continue the BiPAP, continue IV Solu-Medrol  or Decadron , IV Benadryl  and IV famotidine  as scheduled. Unable to give epinephrine  shot given patient has A-fib RVR. -Continue IV Solu-Medrol  40 mg twice daily, iv Benadryl  25 mg 3 times daily and iv famotidine  20 mg twice daily. -Continue ipratropium every 6 hours scheduled.  Avoid albuterol  and levalbuterol  as it provokes A-fib RVR. -Per discussion with patient's sister at the bedside patient is full code  09-16-2023 resolved   DVT prophylaxis: Place and maintain sequential compression device Start: 09/19/23 1211 SCDs Start: 09/07/23 0451 Place TED hose Start: 09/07/23 0451    Code Status: Limited: Do not attempt resuscitation (DNR) -DNR-LIMITED -Do Not Intubate/DNI  Family Communication: discussed with pt's son Sara Barnes and pt's sister Sara Barnes. Pt made DNR/DNI by family this AM. Disposition Plan: unknown. I think pt will not survive this hospitalization. Reason for continuing need for hospitalization: remains on 100% NRB  and 15 L/min O2. I think pt is actively dying. She still has a chest tube.  Objective: Vitals:   09/20/23 0551 09/20/23 0741 09/20/23 0800 09/20/23 0816  BP: 104/61     Pulse:   (!) 119   Resp:   (!) 28   Temp: 99.6 F (37.6 C)     TempSrc: Axillary     SpO2:  98% 97% 94%  Weight:      Height:        Intake/Output Summary (Last 24 hours) at 09/20/2023 0957 Last data filed at 09/20/2023 0655 Gross per 24 hour  Intake 1181.25 ml  Output 570 ml  Net 611.25 ml   Filed Weights   09/06/23 2227  Weight: 78.9  kg    Examination:  Physical Exam Vitals and nursing note reviewed.  Constitutional:      General: She is in acute distress.     Appearance: She is ill-appearing and toxic-appearing.  Cardiovascular:     Rate and Rhythm: Tachycardia present. Rhythm irregular.  Pulmonary:     Effort: Respiratory distress present.  Abdominal:     General: Abdomen is flat.  Musculoskeletal:     Right lower leg: Edema present.     Left lower leg: Edema present.  Skin:    General: Skin is warm and dry.  Neurological:     Mental Status: She is disoriented.    Data Reviewed: I have personally reviewed following labs and imaging studies  CBC: Recent Labs  Lab 09/14/23 0336 09/15/23 0436 09/16/23 0326 09/18/23 1237  WBC 13.9* 15.4* 16.8* 26.0*  NEUTROABS  --   --   --  23.9*  HGB 9.4* 10.0* 10.9* 11.2*  HCT 29.8* 31.9* 33.6* 36.1  MCV 95.8 95.2 93.1 97.3  PLT 238 270 293 316   Basic Metabolic Panel: Recent Labs  Lab 09/14/23 0336 09/15/23 0436 09/16/23 0326 09/18/23 1237 09/19/23 0412  NA 127* 130* 129* 127* 127*  K 4.5 4.3 4.0 4.0 4.5  CL 94* 97* 95* 95* 96*  CO2 24 25 26 23 24   GLUCOSE 117* 97 128* 101* 103*  BUN 28* 22 21 24* 19  CREATININE 0.62 0.53 0.45 0.83 0.63  CALCIUM  8.1* 8.0* 8.1* 7.9* 7.7*  MG  --   --   --  1.9  --    GFR: Estimated Creatinine Clearance: 58.4 mL/min (by C-G formula based on SCr of 0.63 mg/dL). BNP (last 3 results) Recent Labs     09/07/23 0532  BNP 531.6*   CBG: Recent Labs  Lab 09/19/23 0157 09/19/23 1228 09/19/23 1905 09/20/23 0055 09/20/23 0549  GLUCAP 102* 124* 108* 96 86   Recent Results (from the past 240 hours)  Body fluid culture w Gram Stain     Status: None   Collection Time: 09/16/23  3:34 PM   Specimen: PATH Cytology Pleural fluid  Result Value Ref Range Status   Specimen Description   Final    PLEURAL Performed at [facility], [address] [place], [place] [address]    Special Requests   Final    NONE Performed at [facility], [address], [place], [place] [address]    Gram Stain   Final    FEW WBC PRESENT, PREDOMINANTLY MONONUCLEAR NO ORGANISMS SEEN    Culture   Final    NO GROWTH 3 DAYS Performed at [facility], [address], [place], [place] [address]    Report Status 09/20/2023 FINAL  Final     Radiology Studies: DG CHEST PORT 1 VIEW Result Date: 09/20/2023 CLINICAL DATA:  Respiratory failure. EXAM: PORTABLE CHEST 1 VIEW COMPARISON:  09/19/2023. FINDINGS: Interval worsening of diffuse interstitial and alveolar opacities with near complete opacification of bilateral hemithorax sparing a portion of the left apex. Left mid lung chest tube in place. No pneumothorax. Right-sided PICC tip overlies RA/SVC junction. Calcified aorta. Cardiac silhouette is obscured. IMPRESSION: Worsening opacification of bilateral hemithorax. Electronically Signed   By: Sydell Eva M.D.   On: 09/20/2023 08:27   DG CHEST PORT 1 VIEW Result Date: 09/19/2023 CLINICAL DATA:  Left pleural effusion. EXAM: PORTABLE CHEST 1 VIEW COMPARISON:  09/18/2023 FINDINGS: Right upper extremity PICC remains in place. Left-sided pigtail coiled  medially in the mid lower hemithorax. Unchanged opacity throughout the left mid and lower hemithorax. No visible pneumothorax. Diminished right pleural effusion. Multifocal airspace disease throughout the right hemithorax is otherwise  unchanged. No right pneumothorax. Mediastinal contours are obscured by bilateral airspace disease. IMPRESSION: 1. Left-sided pigtail coiled medially in the mid lower hemithorax. Unchanged opacity throughout the left mid and lower hemithorax. 2. Diminished right pleural effusion. Multifocal airspace disease throughout the right hemithorax is otherwise unchanged. Electronically Signed   By: Chadwick Colonel M.D.   On: 09/19/2023 11:29    Scheduled Meds:  arformoterol   15 mcg Nebulization BID   Chlorhexidine  Gluconate Cloth  6 each Topical Daily   vitamin D3  1,000 Units Oral Daily   diltiazem   240 mg Oral Daily   FLUoxetine   20 mg Oral Daily   fluticasone   2 spray Each Nare Daily   guaiFENesin   600 mg Oral BID   lidocaine   1 patch Transdermal Q24H   liver oil-zinc  oxide   Topical TID   nystatin   5 mL Oral QID   revefenacin   175 mcg Nebulization Daily   sodium chloride  flush  10 mL Intrapleural Q8H   sodium chloride  flush  10-40 mL Intracatheter Q12H   sodium chloride  flush  3 mL Intravenous Q12H   sodium chloride   1 g Oral TID WC   Continuous Infusions:  lactated ringers  50 mL/hr at 09/19/23 1315     LOS: 13 days   Time spent: 60 minutes  Unk Garb, DO  [facility]  09/20/2023, 9:57 AM

## 2023-09-20 NOTE — Progress Notes (Signed)
   Talked with pt's sister linda and pt's [name]. Pt unable to make medical decisions for herself. Family has decided to make pt DNR/DNI. They will think about comfort care today and let me know.  Code status changed to DNR/DNI.  Unk Garb, DO [facility]

## 2023-09-20 NOTE — Plan of Care (Signed)
  Problem: Activity: Goal: Ability to tolerate increased activity will improve 09/20/2023 0709 by Prudy Brownie, RN Outcome: Progressing 09/20/2023 0708 by Prudy Brownie, RN Outcome: Progressing 09/20/2023 0708 by Prudy Brownie, RN Outcome: Progressing 09/20/2023 0708 by Prudy Brownie, RN Outcome: Progressing   Problem: Clinical Measurements: Goal: Ability to maintain a body temperature in the normal range will improve 09/20/2023 0709 by Prudy Brownie, RN Outcome: Progressing 09/20/2023 0708 by Prudy Brownie, RN Outcome: Progressing 09/20/2023 0708 by Prudy Brownie, RN Outcome: Progressing 09/20/2023 0708 by Prudy Brownie, RN Outcome: Progressing   Problem: Respiratory: Goal: Ability to maintain adequate ventilation will improve 09/20/2023 0708 by Prudy Brownie, RN Outcome: Progressing 09/20/2023 0708 by Prudy Brownie, RN Outcome: Progressing Goal: Ability to maintain a clear airway will improve Outcome: Progressing

## 2023-09-20 NOTE — Plan of Care (Signed)
  Problem: Activity: Goal: Ability to tolerate increased activity will improve 09/20/2023 0708 by Prudy Brownie, RN Outcome: Progressing 09/20/2023 0708 by Prudy Brownie, RN Outcome: Progressing   Problem: Clinical Measurements: Goal: Ability to maintain a body temperature in the normal range will improve 09/20/2023 0708 by Prudy Brownie, RN Outcome: Progressing 09/20/2023 0708 by Prudy Brownie, RN Outcome: Progressing   Problem: Respiratory: Goal: Ability to maintain adequate ventilation will improve 09/20/2023 0708 by Prudy Brownie, RN Outcome: Progressing 09/20/2023 0708 by Prudy Brownie, RN Outcome: Progressing Goal: Ability to maintain a clear airway will improve Outcome: Progressing

## 2023-09-20 NOTE — Progress Notes (Signed)
 NAME:  Sara Barnes, MRN:  [id number], DOB:  [birth year], LOS: 13 ADMISSION DATE:  09/06/2023, CONSULTATION DATE:  5/13 REFERRING MD:  Jenell Mirza, CHIEF COMPLAINT:  wheezing, respiratory failure   History of Present Illness:  Sara Barnes is a 83 y.o. woman with a history of metastatic breast cancer who was admitted to the hospital with acute respiratory failure on 5/12 after developing shortness of breath, productive cough, wheezing on 5/11.  She had a fall with hip fracture in April and has been at rehab since then, making good progress.  At baseline for the last 2 years she has had activity limitation, but it is unclear if this is more due to fatigue or dyspnea on exertion.  This had been at baseline until her symptoms began acutely 2 days ago.  En route to the hospital on BiPAP she developed A-fib with RVR after receiving a breathing treatment.  She is out of A-fib today and remains off BiPAP.  She had an episode of worse shortness of breath this morning.  Family requesting pulmonology consult for wheezing.  No PTA inhalers.  No history of tobacco abuse.  Has a history of thoracentesis in January 2023> atypical cells but not confirmed malignant.  She reports that her symptoms now feel like when she had her thoracentesis in 2023.  No chronic antiplatelet or anticoagulant medications.  History is obtained with help of her sister due to patient being very hard of hearing.  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 Hip fracture 07/2023 Crohn's  Significant Hospital Events: Including procedures, antibiotic start and stop dates in addition to other pertinent events   5/12 admitted, started on azithromycin  and ceftriaxone  5/13 left thoracentesis with 800 cc clear yellow fluid removed 5/14 issues with pain and confusion overnight 5/20 1 repeat Thora 600 cc fluid removed 5/22 pigtail 5/24 talc pleurodesis  Interim History / Subjective:   Increased hypoxia  overnight, now on high flow nasal cannula plus nonrebreather. DNR issued Chest tube output 220 cc  Objective    Blood pressure 104/61, pulse (!) 119, temperature 99.6 F (37.6 C), temperature source Axillary, resp. rate (!) 28, height 5\' 6"  (1.676 m), weight 78.9 kg, SpO2 94%.    FiO2 (%):  [100 %] 100 % PEEP:  [5 cmH20] 5 cmH20   Intake/Output Summary (Last 24 hours) at 09/20/2023 1003 Last data filed at 09/20/2023 4098 Gross per 24 hour  Intake 1181.25 ml  Output 570 ml  Net 611.25 ml   Filed Weights   09/06/23 2227  Weight: 78.9 kg    Examination: Chronically ill, frail, moderate distress due to pain/hypoxia, on HFNC and nonrebreather Scattered bruising Ext warm Decreased breath sounds on left, no accessory muscle use, no airleak on Pleur-evac S1-S2 regular  Chest x-ray reviewed shows extensive bilateral airspace disease with effusions Labs show persistent hyponatremia, leukocytosis  Resolved problem list   Assessment and Plan  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  She does benefit from thoracenteses.   - Discontinue chest tube, hopefully this will decrease her pain   Acute pain -she reports urinary retention with Tylenol  so we will DC Currently on narcotics, Lidoderm  patch. Unfortunately NSAIDs are contraindicated due to chron's disease  Acute respiratory failure with hypoxia Bilateral infiltrates attributed to metapneumovirus pneumonia  - Unfortunately this has progressed. I updated sister and son at bedside.  Agree with  care limitations, I explained to them that even if she survives this episode of pneumonia, that she will have the performance status to pursue more treatment for cancer.  They need some more time to discuss but seem agreeable to moving towards comfort care. PCCM will be available as needed  [name] MD.  [facility]. [facility] Pager : [phone]  If no response to pager , please call [phone] until 7 pm After 7:00 pm call Elink  [phone]     09/20/2023, 10:03 AM

## 2023-09-20 NOTE — Progress Notes (Signed)
 RT was called because pt wanted her bipap mask off. Patient refuses to wear it. RT explained to the patient why she needed to wear it but she still refused.

## 2023-09-20 NOTE — Plan of Care (Signed)
  Problem: Activity: Goal: Ability to tolerate increased activity will improve Outcome: Progressing   Problem: Clinical Measurements: Goal: Ability to maintain a body temperature in the normal range will improve Outcome: Progressing   Problem: Respiratory: Goal: Ability to maintain adequate ventilation will improve Outcome: Progressing   

## 2023-09-21 DIAGNOSIS — C50912 Malignant neoplasm of unspecified site of left female breast: Secondary | ICD-10-CM | POA: Diagnosis not present

## 2023-09-21 DIAGNOSIS — Z515 Encounter for palliative care: Secondary | ICD-10-CM | POA: Diagnosis not present

## 2023-09-21 DIAGNOSIS — J910 Malignant pleural effusion: Secondary | ICD-10-CM | POA: Diagnosis not present

## 2023-09-21 DIAGNOSIS — E871 Hypo-osmolality and hyponatremia: Secondary | ICD-10-CM | POA: Diagnosis not present

## 2023-09-21 DIAGNOSIS — Z7189 Other specified counseling: Secondary | ICD-10-CM | POA: Diagnosis not present

## 2023-09-21 DIAGNOSIS — J9601 Acute respiratory failure with hypoxia: Secondary | ICD-10-CM | POA: Diagnosis not present

## 2023-09-21 DIAGNOSIS — Z66 Do not resuscitate: Secondary | ICD-10-CM | POA: Diagnosis not present

## 2023-09-21 DIAGNOSIS — R339 Retention of urine, unspecified: Secondary | ICD-10-CM | POA: Diagnosis not present

## 2023-09-21 DIAGNOSIS — R0603 Acute respiratory distress: Secondary | ICD-10-CM | POA: Diagnosis not present

## 2023-09-21 LAB — GLUCOSE, CAPILLARY
Glucose-Capillary: 85 mg/dL (ref 70–99)
Glucose-Capillary: 87 mg/dL (ref 70–99)

## 2023-09-21 MED ORDER — MORPHINE BOLUS VIA INFUSION
5.0000 mg | INTRAVENOUS | Status: DC | PRN
  Administered 2023-09-21 (×4): 5 mg via INTRAVENOUS

## 2023-09-21 MED ORDER — SODIUM CHLORIDE 0.9% FLUSH: 3.0000 mL | Freq: Two times a day (BID) | INTRAVENOUS | Status: DC

## 2023-09-21 MED ORDER — ACETAMINOPHEN 325 MG PO TABS: 650.0000 mg | ORAL_TABLET | Freq: Four times a day (QID) | ORAL | Status: DC | PRN

## 2023-09-21 MED ORDER — MORPHINE 100MG IN NS 100ML (1MG/ML) PREMIX INFUSION
0.0000 mg/h | INTRAVENOUS | Status: DC
  Administered 2023-09-21: 15 mg/h via INTRAVENOUS
  Administered 2023-09-21: 5 mg/h via INTRAVENOUS
  Administered 2023-09-21: 15 mg/h via INTRAVENOUS
  Administered 2023-09-21: 10 mg/h via INTRAVENOUS
  Filled 2023-09-21 (×3): qty 100

## 2023-09-21 MED ORDER — GLYCOPYRROLATE 1 MG PO TABS: 1.0000 mg | ORAL_TABLET | ORAL | Status: DC | PRN

## 2023-09-21 MED ORDER — ACETAMINOPHEN 650 MG RE SUPP: 650.0000 mg | Freq: Four times a day (QID) | RECTAL | Status: DC | PRN

## 2023-09-21 MED ORDER — SODIUM CHLORIDE 0.9% FLUSH: 3.0000 mL | INTRAVENOUS | Status: DC | PRN

## 2023-09-21 MED ORDER — GLYCOPYRROLATE 0.2 MG/ML IJ SOLN: 0.2000 mg | INTRAMUSCULAR | Status: DC | PRN

## 2023-09-21 MED ORDER — POLYVINYL ALCOHOL 1.4 % OP SOLN: 1.0000 [drp] | Freq: Four times a day (QID) | OPHTHALMIC | Status: DC | PRN

## 2023-09-21 MED ORDER — HYDROMORPHONE HCL 1 MG/ML IJ SOLN
1.0000 mg | INTRAMUSCULAR | Status: DC | PRN
  Administered 2023-09-21 (×2): 1 mg via INTRAVENOUS
  Filled 2023-09-21 (×2): qty 1

## 2023-09-27 NOTE — Progress Notes (Signed)
 Per spiritual care consult request and also referral by palliative care team, Barnett Libel, DO, I attempted to visit. At this time, family was receiving visit from their clergy person. I will leave the consult for other chaplains awareness and also attempt to follow up later today for ongoing support as needed.  Please page for urgent support needs. Grizel Vesely L. Minetta Aly, M.Div [phone]

## 2023-09-27 NOTE — Progress Notes (Signed)
 NAME:  Sara Barnes, MRN:  [id number], DOB:  [birth year], LOS: 14 ADMISSION DATE:  09/06/2023, CONSULTATION DATE:  5/13 REFERRING MD:  Jenell Mirza, CHIEF COMPLAINT:  wheezing, respiratory failure   History of Present Illness:  Sara Barnes is a 83 y.o. woman with a history of metastatic breast cancer who was admitted to the hospital with acute respiratory failure on 5/12 after developing shortness of breath, productive cough, wheezing on 5/11.  She had a fall with hip fracture in April and has been at rehab since then, making good progress.  At baseline for the last 2 years she has had activity limitation, but it is unclear if this is more due to fatigue or dyspnea on exertion.  This had been at baseline until her symptoms began acutely 2 days ago.  En route to the hospital on BiPAP she developed A-fib with RVR after receiving a breathing treatment.  She is out of A-fib today and remains off BiPAP.  She had an episode of worse shortness of breath this morning.  Family requesting pulmonology consult for wheezing.  No PTA inhalers.  No history of tobacco abuse.  Has a history of thoracentesis in January 2023> atypical cells but not confirmed malignant.  She reports that her symptoms now feel like when she had her thoracentesis in 2023.  No chronic antiplatelet or anticoagulant medications.  History is obtained with help of her sister due to patient being very hard of hearing.  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 Hip fracture 07/2023 Crohn's  Significant Hospital Events: Including procedures, antibiotic start and stop dates in addition to other pertinent events   5/12 admitted, started on azithromycin  and ceftriaxone  5/13 left thoracentesis with 800 cc clear yellow fluid removed 5/14 issues with pain and confusion overnight 5/20 1 repeat Thora 600 cc fluid removed 5/22 pigtail 5/24 talc pleurodesis 5/25 DNR issued, DC pigtail  Interim History / Subjective:    Worsening hypoxia, placed on BiPAP overnight. Hospitalist reconsulted to continue palliative conversation with family  Objective    Blood pressure 105/82, pulse (!) 128, temperature 97.8 F (36.6 C), temperature source Axillary, resp. rate (!) 23, height 5\' 6"  (1.676 m), weight 78.9 kg, SpO2 95%.    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  She does benefit from thoracenteses.   - Status post talc pleurodesis   Acute respiratory failure with hypoxia Bilateral infiltrates attributed to metapneumovirus pneumonia and metastatic cancer  - Unfortunately this has progressed, she is on BiPAP and still struggling with work of breathing Discussed with son and sister at bedside.  Explained that we have exhausted all options and death is inevitable.  They agreed to transition to comfort care. Placed orders for morphine  drip, once comfortable, BiPAP can be discontinued and keep on  oxygen  [name] MD. [facility]le Lites. [facility] Pager : [phone]  If no response to pager , please call [phone] until 7 pm After 7:00 pm call Elink  [phone]     09/23/2023, 11:00 AM

## 2023-09-27 NOTE — Progress Notes (Signed)
 PROGRESS NOTE    Sara Barnes  ZOX:[id number] DOB: 1941/03/10 DOA: 09/06/2023 PCP: Estill Hemming, DO  Subjective: Pt seen and examined. Met with Pt's sister Sara Barnes and pt's son Sara Barnes at outside her room. Pt is obvious respiratory distress. Wearing bipap with sats in the high 80s. Tachycardic with HR in the 120s.  She is actively dying.  Son wants to talk to PCCM despite my advice that she is actively dying.  Has sent message to Dr. Villa Greaser to see pt and family again.   Hospital Course: HPI: Sara Barnes is a 83 y.o. 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   During discharge from the hospital patient being placed on IV Lovenox  40 mg daily for DVT prophylaxis.       ED Course:  At presentation to ED patient found tachycardic heart rate 162, hypertensive, tachypneic and O2 sat 100% on 8 L oxygen.  Currently requiring BiPAP. ED ED O2 sat dropped to 80% on room air.  Initially placed on 3 L oxygen however requiring more oxygen so patient has been placed on BiPAP.  In the ED patient has been started on Cardizem  drip and IV heparin  drip.   For the management of sepsis patient received 2 L of LR bolus, azithromycin  and ceftriaxone .   Dr. Kermit Ped reported that even though patient has been pretreated with Benadryl  and    Of note, chart review previous CT abdomen pelvis from 01/16/2023 no evidence of hepatic hematoma anymore stable hepatic and osseous metastatic disease progression.   Given patient has allergic reaction with IV contrast discussed case with PCCM Dr. Charon Copper who recommended to give IV Decadron /Solu-Medrol , continue IV Benadryl  and IV famotidine .  History of allergic reaction/hives with prednisone .  Discussed with PCCM reviewed and clarified that it is not uncommon that patient is allergic to prednisone  however it is safe to give Decadron  or Solu-Medrol . Also unable to give epinephrine  shot in the setting of A-fib RVR currently on Cardizem  drip.   Discussed case with cardiology Dr.Bandarage who recommended continue the Cardizem  drip and heparin  drip.  Cardiology will see patient in the daytime.   Hospitalist has been consulted for further management of allergic reaction with contrast, A-fib RVR, sepsis due to pneumonia, acute metabolic encephalopathy and acute hypoxic respiratory failure.  Significant Events: Admitted 09/06/2023 for respiratory distress   Admission Labs: Na 130, K 2.8, CO2 of 23, BUN 7, Scr 0.8. glu 136 WBC 6.0, HgB 11.0, plt 278 INR 1.0 Lactic 2.1 Covid/RSV/flu negative BNP 531  Admission Imaging Studies: CXR Marked severity left basilar atelectasis and/or infiltrate. 2. Small left pleural effusion CTPA No evidence of pulmonary embolism. 2. Small patchy right apical and anterior right upper lobe infiltrates. 3. Small right pleural effusion with a moderate size left pleural effusion. 4. Small, stable anterior mediastinal lymph node which corresponds to a hypermetabolic focus seen on the prior nuclear medicine PET/CT. 5. Findings consistent with osseous metastatic disease, as described above.  6. Small hiatal hernia. 7. Aortic atherosclerosis.  Antibiotic Therapy: Anti-infectives (From admission, onward)    Start     Dose/Rate Route Frequency Ordered Stop   09/08/23 0800   Vancomycin  (VANCOCIN ) 1,250 mg in sodium chloride  0.9 % 250 mL IVPB  Status:  Discontinued        1,250 mg 166.7 mL/hr over 90 Minutes Intravenous Every 24 hours 09/07/23 0501 09/08/23 0920   09/07/23 0500  ceFEPIme  (MAXIPIME ) 2 g in sodium chloride  0.9 % 100 mL IVPB  Status:  Discontinued        2 g 200 mL/hr over 30 Minutes Intravenous Every 12 hours 09/07/23 0452 09/09/23 1152   09/07/23 0500  vancomycin  (VANCOREADY) IVPB 1500 mg/300 mL        1,500 mg 150 mL/hr over 120 Minutes Intravenous  Once 09/07/23 0452 09/07/23 1040   09/06/23 2300  cefTRIAXone  (ROCEPHIN ) 1 g in sodium chloride  0.9 % 100 mL IVPB        1 g 200 mL/hr over 30 Minutes Intravenous  Once 09/06/23 2246 09/06/23 2344   09/06/23 2300  azithromycin  (ZITHROMAX ) 500 mg in sodium chloride  0.9 % 250 mL IVPB        500 mg 250 mL/hr over 60 Minutes Intravenous  Once 09/06/23 2246 09/07/23 0044       Procedures: 09-08-2023 thoracentesis  Consultants: Cardiology Palliative care PCCM    Assessment and Plan: * Respiratory distress-resolved as of 09/18/2023 Prior to 09-16-2023 - Tested positive for metapneumovirus - Sepsis present on admission - Repeat chest x-ray reviewed independently.  Await formal radiology report, but on my read does seem more fluid overloaded than previous.  Ordered 1 dose IV Lasix  today, also back on IV Solu-Medro  09-16-2023 resolved. Respiratory distress  DNR (do not resuscitate)/DNI(Do Not Intubate) 09-20-2023 Talked with pt's sister Sara Barnes and pt's son Sara Barnes. Pt unable to make medical decisions for herself. Family has decided to make pt DNR/DNI. They will think about comfort care today and let me know.   Code status changed to DNR/DNI.  09/13/2023 pt really needs to be comfort care. Dr. Villa Greaser to talk to family again today.  Acute respiratory failure with hypoxia (HCC) 09-16-2023 multifactorial. Likely a combination of bilateral pleural effusion L >R, rapid afib, volume overload, viral  pneumonia. Currently on 7 L/min.  09-17-2023 likely due to viral pneumonia, malignant pleural effusion and cancer mets.  09-18-2023 wean O2 to 2 L/min. Will obtain home O2 eval on Monday.  09-19-2023 worsening today. Pt remains full code.  09-20-2023 pt changed to DNR/DNI status by family. Now on 100% NRB and 15 L/min high flow cannula.  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 Bedside thoracic U/S today shows moderate/large left effusion. Estimated [phone] ml. Pt agreeable to having IR perform another thoracentesis. I think this will help her oxygen requirements.  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 With the effusion returning in just 7 days, this is an extremely poor prognosis. Have contacted pt's oncologist. Pt may be a candidate for Pleurx catheter placement by either PCCM or IR. May need to hold Eliquis . Also will need to talk to PCCM vs IR about their thoughts about placing Pleurx Catheter in a patient that is still a full code given the risk of infection for a long term chest tube.  Pt and her sister have been resistant to talk to palliative care. However, given that  her breast cancer has spread to her pleural, this changes her overall prognosis significantly.  Discussed with pt and her sister Sara Barnes that she had malignant left side pleural effusion. Discussed that this means her cancer has spread to the lung or pleural lining. Discussed that she will have continued/recurrent effusion.  Discussed that her overall prognosis was poor given malignant pleural effusion. Discussed management options regarding her pleural effusion.  Have enlisted assistance of PCCM for Pleurx catheter placement.  Will hold eliquis  after this AM dose. PCCM plans on 48 hour eliquis  washout and plan for Pleurx catheter placement on Monday.  Pt's oncologist aware of pt's  malignant pleural effusion.  09-18-2023 pt has about 400 ml in chest atrium this AM.  Pt had left side pigtail catheter placed into pleural space yesterday by PCCM in hopes to attempt talc pleurodesis today. If this fails, then pt will get pleurx catheter.   I think pt and her sister have unrealistic expectation on pt's overall prognosis of her metastatic cancer. Oncology following. Pt and sister resistant to palliative care attempts on goals of care discussion.  Pt is refusing to go to SNF and wants to go home with pt's elderly sister to provide for her care.   09-19-2023 I spoke to pt's sister Sara Barnes outside of pt's room. Discussed pt's overall very poor prognosis. I discussed with Sara Barnes my concern about how weak pt was and the likelihood that pt only had a few weeks to few months left to live. PCCM managing chest tube and pleural effusion. Not sure if talc pleurodesis is going to work.  Pt looks worse today.  09-20-2023  RN states pt did have talc pleurodesis performed by pulmonology yesterday.   Son and pt's sister have changed pt's code status to DNR/DNI.  I think pt is starting active dying phase. Family to decide on comfort care measures. Her CXR from this AM looks terrible. I don't think pt has long to live.  08/29/2023 Met with Pt's sister Sara Barnes and pt's son Sara Barnes at outside her room. Pt is obvious respiratory distress. Wearing bipap with sats in the high 80s. Tachycardic with HR in the 120s.   She is actively dying.  Son wants to talk to PCCM despite my advice that she is actively dying.   Has sent message to Dr. Villa Greaser to see pt and family again.  Human metapneumovirus (hMPV) pneumonia-resolved as of 09/16/2023 Prior to 09-16-2023 - Tested positive for metapneumovirus - Sepsis present on admission - Repeat chest x-ray reviewed independently.  Await formal radiology report, but on my read does seem more fluid overloaded than previous.  Ordered 1 dose IV Lasix  today, also back on IV  Solu-Medrol   09-16-2023 resolved. 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 Pt has been on IV solumedrol, therefore checking cortisol level will not be useful. Echo showed EF 70%. Pt seem volume overloaded. May need to use Samsca to get rid of free water instead of diuretics.  09-17-2023 will cancel workup of hyponatremia given pt's malignant pleural effusion. Her overall prognosis is quite poor.  09-19-2023 Na 127.  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  09-18-2023 I think pt and her sister have unrealistic expectation on pt's overall prognosis of her metastatic cancer. Oncology following. Pt and sister resistant to palliative care attempts on  goals of care discussion. Pt is refusing to go to SNF and wants to go home with pt's elderly sister to provide for her care.   09-19-2023 I spoke to pt's sister Sara Barnes outside of pt's room. Discussed pt's overall very poor prognosis. I discussed with Sara Barnes my concern about how weak pt was and the likelihood that pt only had a few weeks to few months left to live. PCCM managing chest tube and pleural effusion. Not sure if talc pleurodesis is going to work.  Pt looks worse today.  Pt remains a full code.  09-20-2023 son and pt's sister aware of pt's terminal prognosis. I think pt is actively dying given her increased O2 requirements, rapid afib, etc. Pt's code status change to DNR/DNI. Family to decide on comfort care measures today.  Paroxysmal A-fib (HCC) Prior to 09-16-2023. Pt had rapid afib.  Now off Cardizem  drip and on oral Cardizem  - No hematoma seen on CT.  This was discussed with patient and sister.  Started on Eliquis   09-16-2023 continue cardizem  CD and Eliquis .  09-17-2023 continue cardizem  CD. Holding Eliquis  after  this AM dose for pleurx Catheter placement by PCCM. Hopefully on Monday.  09-18-2023 currently off Eliquis  per PCCM. Continue cardizem  CD.  09-19-2023 remains off Eliquis  per PCCM. They will need to restart anticoagulation when they feel it is appropriate. For now, SCDs.  09-20-2023 in rapid afib this AM. I think pt is actively dying. Awaiting family to decide about comfort care measures. Will hold off in IV cardizem  infusion for now.  09/26/2023 back in rapid afib. If family does not change to comfort care, will need to restart IV cardizem  gtts.  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  Acute metabolic encephalopathy-resolved as of 09/17/2023 09-16-2023 multifactorial. Likely due to viral pneumonia, hypoxia, hyponatremia. Now resolved. Pt is AxOx4. Pt is very hard of hearing.  Allergic reaction to contrast-resolved as of 09/16/2023 On admission.  - In the ED patient has been pretreated with Benadryl  and famotidine  and already received Solu-Medrol  125 mg before coming to the ED still after contrast exposure patient developed itchiness and wheezing.  Received second dose of IV Solu-Medrol , Benadryl  and famotidine  in the ED.   -After discussion with PCCM recommended to continue the BiPAP, continue IV Solu-Medrol  or Decadron , IV Benadryl  and IV famotidine  as scheduled. Unable to give epinephrine  shot given patient has A-fib RVR. -Continue IV Solu-Medrol  40 mg twice daily, iv Benadryl  25 mg 3 times daily and iv famotidine  20 mg twice daily. -Continue ipratropium every 6 hours scheduled.  Avoid albuterol  and levalbuterol  as it provokes A-fib RVR. -Per discussion with patient's sister at the bedside patient is full code  09-16-2023 resolved      DVT prophylaxis: Place and maintain sequential compression device Start: 09/19/23 1211 SCDs Start: 09/07/23  0451 Place TED hose Start: 09/07/23 0451     Code Status: Do not attempt resuscitation (DNR) - Comfort care Family Communication: discussed with son and pt's sister outside of pt's room Disposition Plan: unknown. Pt is terminal. Will likely die in the hospital Reason for continuing need for hospitalization: remains on Bipap, high flow O2.  Objective: Vitals:   09/20/23 2200 08/27/2023 0330 09/16/2023 0748 09/10/2023 0800  BP: 106/88 122/79 105/82   Pulse: (!) 137 (!) 128    Resp: (!) 26 (!) 23    Temp:  97.8 F (36.6 C)  TempSrc:  Axillary    SpO2: 94% 93%  95%  Weight:      Height:        Intake/Output Summary (Last 24 hours) at 09/08/2023 1111 Last data filed at 09/20/2023 1914 Gross per 24 hour  Intake 0 ml  Output 500 ml  Net -500 ml   Filed Weights   09/06/23 2227  Weight: 78.9 kg    Examination:  Physical Exam Vitals and nursing note reviewed.  Constitutional:      Comments: In acute distress. Looks ill  Cardiovascular:     Rate and Rhythm: Tachycardia present. Rhythm irregular.  Pulmonary:     Effort: Respiratory distress present.     Comments: Wearing bipap Musculoskeletal:     Right lower leg: Edema present.     Left lower leg: Edema present.  Neurological:     Comments: unresponsive     Data Reviewed: I have personally reviewed following labs and imaging studies  CBC: Recent Labs  Lab 09/15/23 0436 09/16/23 0326 09/18/23 1237  WBC 15.4* 16.8* 26.0*  NEUTROABS  --   --  23.9*  HGB 10.0* 10.9* 11.2*  HCT 31.9* 33.6* 36.1  MCV 95.2 93.1 97.3  PLT 270 293 316   Basic Metabolic Panel: Recent Labs  Lab 09/15/23 0436 09/16/23 0326 09/18/23 1237 09/19/23 0412  NA 130* 129* 127* 127*  K 4.3 4.0 4.0 4.5  CL 97* 95* 95* 96*  CO2 25 26 23 24   GLUCOSE 97 128* 101* 103*  BUN 22 21 24* 19  CREATININE 0.53 0.45 0.83 0.63  CALCIUM  8.0* 8.1* 7.9* 7.7*  MG  --   --  1.9  --    GFR: Estimated Creatinine Clearance: 58.4 mL/min (by C-G formula  based on SCr of 0.63 mg/dL). BNP (last 3 results) Recent Labs    09/07/23 0532  BNP 531.6*   CBG: Recent Labs  Lab 09/20/23 0549 09/20/23 1110 09/20/23 1851 09/20/2023 0003 09/17/2023 0602  GLUCAP 86 79 81 85 87    Recent Results (from the past 240 hours)  Body fluid culture w Gram Stain     Status: None   Collection Time: 09/16/23  3:34 PM   Specimen: PATH Cytology Pleural fluid  Result Value Ref Range Status   Specimen Description   Final    PLEURAL Performed at [facility], [address], [place], [place] [address]    Special Requests   Final    NONE Performed at [facility], [address], [place], [place] [address]    Gram Stain   Final    FEW WBC PRESENT, PREDOMINANTLY MONONUCLEAR NO ORGANISMS SEEN    Culture   Final    NO GROWTH 3 DAYS Performed at [facility], [address] [place], [place] [address]    Report Status 09/20/2023 FINAL  Final     Radiology Studies: DG CHEST PORT 1 VIEW Result Date: 09/20/2023 CLINICAL DATA:  Respiratory failure. EXAM: PORTABLE CHEST 1 VIEW COMPARISON:  09/19/2023. FINDINGS: Interval worsening of diffuse interstitial and alveolar opacities with near complete opacification of bilateral hemithorax sparing a portion of the left apex. Left mid lung chest tube in place. No pneumothorax. Right-sided PICC tip overlies RA/SVC junction. Calcified aorta. Cardiac silhouette is obscured. IMPRESSION: Worsening opacification of bilateral hemithorax. Electronically Signed   By: Sydell Eva M.D.   On: 09/20/2023 08:27    Scheduled Meds:  Chlorhexidine  Gluconate Cloth  6 each Topical Daily   lidocaine   1 patch Transdermal Q24H   liver oil-zinc  oxide   Topical TID   sodium chloride  flush  10-40 mL Intracatheter Q12H   sodium chloride  flush  3 mL Intravenous Q12H   sodium chloride  flush  3-10 mL Intravenous Q12H   sodium chloride   1 g Oral TID WC   Continuous Infusions:  morphine        LOS: 14 days    Time spent: 50 minutes  Unk Garb, DO  [facility]  09/22/2023, 11:11 AM

## 2023-09-27 NOTE — Progress Notes (Signed)
    OVERNIGHT PROGRESS REPORT  Notified by RN that patient is deceased as of 2055-10-21 Hrs.  Patient was DNR/CMO (comfort measures only) 2 RN verified.  Family was present with RNs.     Denece Finger MSNA MSN ACNPC-AG Acute Care Nurse Practitioner [facility]

## 2023-09-27 NOTE — Procedures (Signed)
 Chest Tube Pleurodesis Note  MORNING HALBERG  [id number]  1940/07/02  Date:09/02/2023  Time:11:03 AM   Provider Performing:Jaidin Ugarte V. Aroura Vasudevan   Procedure: Chemical Pleurodesis via Chest Tube (40981)  Indication(s) Induced scarring of pleural space  Consent Risks of the procedure as well as the alternatives and risks of each were explained to the patient and/or caregiver.  Consent for the procedure was obtained.   Anesthesia 10cc 1% Lidocaine  injected in with pleurodesis agents   Time Out Verified patient identification, verified procedure, site/side was marked, verified correct patient position, special equipment/implants available, medications/allergies/relevant history reviewed, required imaging and test results available.   Sterile Technique Hand hygiene, gloves   Procedure Description Existing pleural catheter was cleaned and accessed in sterile manner.  Talc 4g in 50cc saline slurry administered into pleural space.  The chest tube will be clamped and patient will rotate positions for 1 hour then chest tube will be placed to suction.   Complications/Tolerance None; patient tolerated the procedure well.   EBL None   Specimen(s) None   Johanny Segers V. Villa Greaser MD

## 2023-09-27 NOTE — Progress Notes (Signed)
 O2 dropped to 77%. Pt had removed nasal canula, but nonrebreather remained in place. Nasal cannular reapplied. Sats up to 88%. Pt denies any pain. Respiratory called to place pt back on BiPap. Ativan  given to ease anxiety with BiPap placement. Pt sats at 99% while on BiPap. Still has respiratory rate in 30-40s with increased work of breathing.

## 2023-09-27 NOTE — Progress Notes (Signed)
 Physical Therapy Discharge Patient Details Name: Sara Barnes MRN: [id number] DOB: 07-22-1940 Today's Date: 2023-09-26 Time:  -     Patient discharged from PT services secondary to medical decline - will need to re-order PT to resume therapy services.  Please see latest therapy progress note for current level of functioning and progress toward goals.    Progress and discharge plan discussed with patient and/or caregiver:  Discussed with RN.  RN reports pt made comfort care.  Per notes, pending likely hospital death.      [name] Sep 26, 2023, 11:13 AM  Blanch Bunde, DPT Physical Therapist Acute Rehabilitation Services Office: [phone]

## 2023-09-27 NOTE — Progress Notes (Signed)
   Palliative Medicine Inpatient Follow Up Note HPI: 83 y.o. female  with past medical history of hip repair on 08/13/2013, Crohn's disease with diarrhea status post ex lap with diverting ileostomy 2018 + resection ileostomy stricture and revision in 05/2017, metastatic breast cancer to skin, liver, bone initially diagnosed in 2016, PAF/no AC due to hepatic hematoma, anxiety, depression, CKD 2. Palliative care initially involved to support additional goals of care conversations and now to discuss end of life in the setting of an acute clinical decline.    Today's Discussion 09/18/2023  *Please note that this is a verbal dictation therefore any spelling or grammatical errors are due to the "[employer]agon [employer] One" system interpretation.  Chart reviewed inclusive of vital signs, progress notes, laboratory results, and diagnostic images.   Per Dr. Farrel Hones Sheryle Donning has severe respiratory compromise and has started the dying journey.  Dr. Villa Greaser met with family this late morning and converted Yaretzi to full comfort measures.   I met with Layani's son, best friend, DIL, grandson and his fiance at bedside. Created space and opportunity for patients family to explore thoughts feelings and fears regarding Shalice's current medical situation. We reviewed that the medical team has offered all measures to held improve her condition though despite this efforts, Coral's disease burden is too great.   Arnesia's grandson shares the plan for a wedding in October which they had hoped she would be able to live until. We discussed that she will be with them in a spiritual sense.   Patients sister is speaking to a priest outside of the room.   Provided the PMT card should additional support be needed.  Offered empathetic support in the setting of these difficult circumstances.   Objective Assessment: Vital Signs Vitals:   09/20/2023 0748 09/18/2023 0800  BP: 105/82   Pulse:    Resp:    Temp:    SpO2:  95%    Intake/Output  Summary (Last 24 hours) at 09/15/2023 1247 Last data filed at 09/20/2023 1914 Gross per 24 hour  Intake 0 ml  Output 500 ml  Net -500 ml   Last Weight  Most recent update: 09/06/2023 10:29 PM    Weight  78.9 kg (174 lb)            Gen:  Acutely ill appearing Caucasian F HEENT: dry mucous membranes CV: Regular rate and rhythm  PULM:  On NRB FM, breathing is rapid and labored ABD: soft/nontender  EXT: No edema  Neuro: Somnolent  SUMMARY OF RECOMMENDATIONS   DNAR/DNI  Comfort Care  On a Morphine  gtt with boluses Q5H PRN  Additional comfort medications per [facility]  Unrestricted visitation  Anticipate in hospital death   Ongoing PMT support as needed ______________________________________________________________________________________ [place] Palliative Medicine Team Team Cell Phone: [phone] Please utilize secure chat with additional questions, if there is no response within 30 minutes please call the above phone number  Billing based on MDM: High parenteral controlled substance review in the setting of EOL care  Palliative Medicine Team providers are available by phone from 7am to 7pm daily and can be reached through the team cell phone.  Should this patient require assistance outside of these hours, please call the patient's attending physician.

## 2023-09-27 NOTE — Death Summary Note (Signed)
 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 female with medical history significant of Crohn's  disease perforation s/p ex lap with diverting ileostomy 2016/10/15 + resection ileostomy stricture and revision in 05/2017, C. 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   During discharge from the hospital patient being placed on IV Lovenox  40 mg daily for DVT prophylaxis.       ED Course:  At presentation to ED patient found tachycardic heart rate 162, hypertensive, tachypneic and O2 sat 100% on 8 L oxygen.  Currently requiring BiPAP. ED ED O2 sat dropped to 80% on room air.  Initially placed on 3 L oxygen however requiring more oxygen so patient has been placed on BiPAP.  In the ED patient has been started on Cardizem  drip and IV heparin   drip.   For the management of sepsis patient received 2 L of LR bolus, azithromycin  and ceftriaxone .   Dr. Kermit Ped reported that even though patient has been pretreated with Benadryl  and   Of note, chart review previous CT abdomen pelvis from 01/16/2023 no evidence of hepatic hematoma anymore stable hepatic and osseous metastatic disease progression.   Given patient has allergic reaction with IV contrast discussed case with PCCM Dr. Charon Copper who recommended to give IV Decadron /Solu-Medrol , continue IV Benadryl  and IV famotidine .  History of allergic reaction/hives with prednisone .  Discussed with PCCM reviewed and clarified that it is not uncommon that patient is allergic to prednisone  however it is safe to give Decadron  or Solu-Medrol . Also unable to give epinephrine  shot in the setting of A-fib RVR currently on Cardizem  drip.   Discussed case with cardiology Dr.Bandarage who recommended continue the Cardizem  drip and heparin  drip.  Cardiology will see patient in the daytime.   Hospitalist has been consulted for further management of allergic reaction with contrast, A-fib RVR, sepsis due to pneumonia, acute metabolic encephalopathy and acute hypoxic respiratory failure.  Significant Events: Admitted 09-25-23 for respiratory distress   Admission Labs: Na 130, K 2.8, CO2 of 23, BUN 7, Scr 0.8. glu 136 WBC 6.0, HgB 11.0, plt 278 INR 1.0 Lactic 2.1 Covid/RSV/flu negative BNP 531  Admission Imaging Studies: CXR Marked severity left basilar atelectasis and/or infiltrate. 2. Small left pleural effusion CTPA No evidence of pulmonary embolism. 2. Small patchy right apical and anterior right upper lobe infiltrates. 3. Small right pleural effusion with a moderate size left pleural effusion. 4. Small,  stable anterior mediastinal lymph node which corresponds to a hypermetabolic focus seen on the prior nuclear medicine PET/CT. 5. Findings consistent with osseous metastatic disease, as described above.  6. Small hiatal hernia. 7. Aortic atherosclerosis.  Antibiotic Therapy: Anti-infectives (From admission, onward)    Start     Dose/Rate Route Frequency Ordered Stop   09/08/23 0800  Vancomycin  (VANCOCIN ) 1,250 mg in sodium chloride  0.9 % 250 mL IVPB  Status:  Discontinued        1,250 mg 166.7 mL/hr over 90 Minutes Intravenous Every 24 hours 09/07/23 0501 09/08/23 0920   09/07/23 0500  ceFEPIme  (MAXIPIME ) 2 g in sodium chloride  0.9 % 100 mL IVPB  Status:  Discontinued        2 g 200 mL/hr over 30 Minutes Intravenous Every 12 hours 09/07/23 0452 09/09/23 1152   09/07/23 0500  vancomycin  (VANCOREADY) IVPB 1500 mg/300 mL        1,500 mg 150 mL/hr over 120 Minutes Intravenous  Once 09/07/23 0452 09/07/23 1040   09/06/23 2300  cefTRIAXone  (ROCEPHIN ) 1 g in sodium chloride  0.9 % 100 mL IVPB        1 g 200 mL/hr over 30 Minutes Intravenous  Once 09/06/23 2246 09/06/23 2344   09/06/23 2300  azithromycin  (ZITHROMAX ) 500 mg in sodium chloride  0.9 % 250 mL IVPB        500 mg 250 mL/hr over 60 Minutes Intravenous  Once 09/06/23 2246 09/07/23 0044       Procedures: 09-08-2023 thoracentesis  Consultants: Cardiology Palliative care PCCM   Assessment and Plan: * Respiratory distress-resolved as of 09/18/2023 Prior to 09-16-2023 - Tested positive for metapneumovirus - Sepsis present on admission - Repeat chest x-ray reviewed independently.  Await formal radiology report, but on my read does seem more fluid overloaded than previous.  Ordered 1 dose IV Lasix  today, also back on IV Solu-Medro  09-16-2023 resolved. Respiratory distress  Malignant neoplasm of breast, stage 4 (HCC) 09-17-2023 has likely spread to her lungs/pleura.  09-18-2023 I think pt and her sister have unrealistic expectation on pt's overall prognosis of her metastatic cancer. Oncology following. Pt and sister resistant to palliative care attempts on goals of care discussion. Pt is refusing to go to SNF and wants to go home  with pt's elderly sister to provide for her care.   09-19-2023 I spoke to pt's sister [name] outside of pt's room. Discussed pt's overall very poor prognosis. I discussed with [name] my concern about how weak pt was and the likelihood that pt only had a few weeks to few months left to live. PCCM managing chest tube and pleural effusion. Not sure if talc pleurodesis is going to work.  Pt looks worse today.  Pt remains a full code.  09/29/2023 son and pt's sister aware of pt's terminal prognosis. I think pt is actively dying given her increased O2 requirements, rapid afib, etc. Pt's code status change to DNR/DNI. Family to decide on comfort care measures today.  08/31/2023 family decided on comfort care measures. Pt placed on IV morphine  gtts. Removed from bipap.  Pt died at 10-06-2055 on 09/29/2023. Family was present. Pt is not suitable for organ donation.  Human metapneumovirus (hMPV) pneumonia-resolved as of 09/16/2023 Prior to 09-16-2023 - Tested positive for metapneumovirus - Sepsis present on admission - Repeat chest x-ray reviewed independently.  Await formal radiology report, but on my read does seem more fluid overloaded than previous.  Ordered 1 dose IV Lasix  today, also back on IV Solu-Medrol   09-16-2023 resolved. Today is hospital Day #10  DNR (do not resuscitate)/DNI(Do Not Intubate) 09-20-2023 Talked with pt's sister linda and pt's [name]. Pt unable to make medical decisions for herself. Family has decided to make pt DNR/DNI. They will think about comfort care today and let me know.   Code status changed to DNR/DNI.  08/29/2023 pt really needs to be comfort care. Dr. Villa Greaser to talk to family again today.  Acute respiratory failure with hypoxia (HCC) 09-16-2023 multifactorial. Likely a combination of bilateral pleural effusion L >R, rapid afib, volume overload, viral pneumonia. Currently on 7 L/min.  09-17-2023 likely due to viral pneumonia, malignant pleural effusion and cancer  mets.  09-18-2023 wean O2 to 2 L/min. Will obtain home O2 eval on Monday.  09-19-2023 worsening today. Pt remains full code.  09-20-2023 pt changed to DNR/DNI status by family. Now on 100% NRB and 15 L/min high flow cannula.  08/30/2023 back on bipap  Malignant pleural effusion 09-16-2023 had initial left thoracentesis by PCCM on 09-08-2023 for 800 ml. Bedside thoracic U/S today shows moderate/large left effusion. Estimated [phone] ml. Pt agreeable to having IR perform another thoracentesis. I think this will help her oxygen requirements.  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 With the effusion returning in just 7 days, this is an extremely poor prognosis. Have contacted pt's oncologist. Pt may be a candidate for Pleurx catheter placement by either PCCM or IR. May need to hold Eliquis . Also will need to talk to PCCM vs IR about their thoughts about placing Pleurx Catheter in a patient that is still a full code given the risk of infection for a long term chest tube.  Pt and her sister have been resistant to talk to palliative care. However, given that her breast cancer has spread to her pleural, this changes her overall prognosis significantly.  Discussed with pt and her sister [name] that she had malignant left side pleural effusion. Discussed that this means her cancer has spread to the lung or pleural lining. Discussed that she will have continued/recurrent effusion.  Discussed that her overall prognosis was poor given malignant pleural effusion. Discussed management options regarding her pleural effusion.  Have enlisted assistance of PCCM for Pleurx catheter placement.  Will hold eliquis  after this AM dose. PCCM plans on 48 hour eliquis  washout and plan for Pleurx catheter placement on Monday.  Pt's oncologist aware of pt's malignant pleural effusion.  09-18-2023 pt has about 400 ml in chest atrium this AM.  Pt had left side pigtail  catheter placed into pleural space yesterday by PCCM in hopes to attempt talc pleurodesis today. If this fails, then pt will get pleurx catheter.   I think pt and her sister have unrealistic expectation on pt's overall prognosis of her metastatic cancer. Oncology following. Pt and sister resistant to palliative care attempts on goals of care discussion.  Pt is refusing to go to SNF and wants to go home with pt's elderly sister to provide for her care.   09-19-2023 I spoke to pt's sister [name] outside of pt's room. Discussed pt's overall very poor prognosis. I discussed with [name] my concern about how weak pt was and the likelihood that pt only had a few weeks to few months left to live. PCCM managing chest tube and pleural effusion. Not sure if talc pleurodesis is going to work.  Pt looks worse today.  09-20-2023  RN states pt did have talc pleurodesis performed by pulmonology yesterday.  Son and pt's sister have changed pt's code status to DNR/DNI.  I think pt is starting active dying phase. Family to decide on comfort care measures. Her CXR from this AM looks terrible. I don't think pt has long to live.  10/12/2023 Met with Pt's sister linda and pt's son Forrest at outside her room. Pt is obvious respiratory distress. Wearing bipap with sats in the high 80s. Tachycardic with HR in the 120s.   She is actively dying.  Son wants to talk to PCCM despite my advice that she is actively dying.   Has sent message to Dr. Villa Greaser to see pt and family again.  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 Pt has been on IV solumedrol, therefore checking cortisol level will not be useful. Echo showed EF 70%. Pt seem volume overloaded. May need to use Samsca to get rid of free water instead of diuretics.  09-17-2023 will cancel workup of hyponatremia given pt's malignant  pleural effusion. Her overall prognosis is quite poor.  09-19-2023 Na 127.  Paroxysmal A-fib (HCC) Prior to 09-16-2023. Pt had rapid afib.  Now off Cardizem  drip and on oral Cardizem  - No hematoma seen on CT.  This was discussed with patient and sister.  Started on Eliquis   09-16-2023 continue cardizem  CD and Eliquis .  09-17-2023 continue cardizem  CD. Holding Eliquis  after this AM dose for pleurx Catheter placement by PCCM. Hopefully on Monday.  09-18-2023 currently off Eliquis  per PCCM. Continue cardizem  CD.  09-19-2023 remains off Eliquis  per PCCM. They will need to restart anticoagulation when they feel it is appropriate. For now, SCDs.  09-20-2023 in rapid afib this AM. I think pt is actively dying. Awaiting family to decide about comfort care measures. Will hold off in IV cardizem  infusion for now.  October 12, 2023 back in rapid afib. If family does not change to comfort care, will need to restart IV cardizem  gtts.  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  Acute metabolic encephalopathy-resolved as of 09/17/2023 09-16-2023 multifactorial. Likely due to viral pneumonia, hypoxia, hyponatremia. Now resolved. Pt is AxOx4. Pt is very hard of hearing.  Allergic reaction to contrast-resolved as of 09/16/2023 On admission.  - In the ED patient has been pretreated with Benadryl  and famotidine  and already received Solu-Medrol  125 mg before coming to the ED still after contrast exposure patient developed itchiness and wheezing.  Received second dose of IV Solu-Medrol , Benadryl  and famotidine  in the ED.   -After discussion with PCCM recommended to continue the BiPAP, continue IV Solu-Medrol  or Decadron , IV Benadryl  and IV famotidine  as scheduled. Unable to give epinephrine  shot given patient has A-fib RVR. -Continue IV Solu-Medrol  40 mg twice daily, iv Benadryl  25 mg  3 times daily and iv famotidine  20 mg twice daily. -Continue ipratropium every 6 hours scheduled.  Avoid albuterol  and levalbuterol  as it provokes A-fib RVR. -Per discussion with patient's sister at the bedside patient is full code  09-16-2023 resolved   The results of significant diagnostics from this hospitalization (including imaging, microbiology, ancillary and laboratory) are listed below for reference.   Significant Diagnostic Studies: DG CHEST PORT 1 VIEW Result Date: 09/20/2023 CLINICAL DATA:  Respiratory failure. EXAM: PORTABLE CHEST 1 VIEW COMPARISON:  09/19/2023. FINDINGS: Interval worsening of diffuse interstitial and alveolar opacities with near  complete opacification of bilateral hemithorax sparing a portion of the left apex. Left mid lung chest tube in place. No pneumothorax. Right-sided PICC tip overlies RA/SVC junction. Calcified aorta. Cardiac silhouette is obscured. IMPRESSION: Worsening opacification of bilateral hemithorax. Electronically Signed   By: Sydell Eva M.D.   On: 09/20/2023 08:27   DG CHEST PORT 1 VIEW Result Date: 09/19/2023 CLINICAL DATA:  Left pleural effusion. EXAM: PORTABLE CHEST 1 VIEW COMPARISON:  09/18/2023 FINDINGS: Right upper extremity PICC remains in place. Left-sided pigtail coiled medially in the mid lower hemithorax. Unchanged opacity throughout the left mid and lower hemithorax. No visible pneumothorax. Diminished right pleural effusion. Multifocal airspace disease throughout the right hemithorax is otherwise unchanged. No right pneumothorax. Mediastinal contours are obscured by bilateral airspace disease. IMPRESSION: 1. Left-sided pigtail coiled medially in the mid lower hemithorax. Unchanged opacity throughout the left mid and lower hemithorax. 2. Diminished right pleural effusion. Multifocal airspace disease throughout the right hemithorax is otherwise unchanged. Electronically Signed   By: Chadwick Colonel M.D.   On: 09/19/2023 11:29   DG Chest  Port 1 View Result Date: 09/18/2023 CLINICAL DATA:  Chest tube.  Pleural effusion EXAM: PORTABLE CHEST 1 VIEW COMPARISON:  09/17/2023 FINDINGS: Bilateral airspace disease. Small right pleural effusion with mild increase. No pneumothorax. Right PICC with tip at the upper right atrium. A drain projects over the left chest. With unchanged clamping or mild kink. IMPRESSION: Mild increase in right pleural fluid. Similar degree of extensive bilateral airspace disease. Electronically Signed   By: Ronnette Coke M.D.   On: 09/18/2023 09:46   DG Chest Port 1 View Result Date: 09/17/2023 CLINICAL DATA:  Chest tube EXAM: PORTABLE CHEST 1 VIEW COMPARISON:  09/16/2023, 09/15/2023, 09/08/2023 FINDINGS: Right upper extremity central venous catheter tip over the cavoatrial region. New left-sided chest drainage catheter projects over the medial lower left chest. Decreased left effusion. Enlarged cardiomediastinal silhouette. Small right pleural effusion. Widespread heterogeneous bilateral airspace disease appears slightly worse. No pneumothorax. IMPRESSION: 1. New left-sided chest drainage catheter with decreased left effusion. No pneumothorax. 2. Widespread heterogeneous bilateral airspace disease appears slightly worse. Small right pleural effusion. Electronically Signed   By: Esmeralda Hedge M.D.   On: 09/17/2023 16:53   DG Chest Port 1 View Result Date: 09/16/2023 CLINICAL DATA:  Left pleural effusion EXAM: PORTABLE CHEST 1 VIEW COMPARISON:  CT of the chest 09/07/2023.  Chest x-ray 09/16/2023. FINDINGS: The heart is mildly enlarged, unchanged. Right-sided central venous catheter tip projects over the distal SVC. Bilateral multifocal airspace disease is again noted, not significantly changed. There is a small left pleural effusion which has slightly decreased. No pneumothorax. Osseous structures are stable. There surgical clips in the left chest wall. IMPRESSION: 1. Stable bilateral multifocal airspace disease. 2. Mild  decrease in small left pleural effusion. Electronically Signed   By: Tyron Gallon M.D.   On: 09/16/2023 16:56   US  THORACENTESIS ASP PLEURAL SPACE W/IMG GUIDE Result Date: 09/16/2023 INDICATION: Shortness of breath. Left-sided pleural effusion. Request for diagnostic and therapeutic thoracentesis. EXAM: ULTRASOUND GUIDED LEFT THORACENTESIS MEDICATIONS: 1% plain lidocaine , 5 mL COMPLICATIONS: None immediate. PROCEDURE: An ultrasound guided thoracentesis was thoroughly discussed with the patient and questions answered. The benefits, risks, alternatives and complications were also discussed. The patient understands and wishes to proceed with the procedure. Written consent was obtained. Ultrasound was performed to localize and mark an adequate pocket of fluid in the left chest. The area was then prepped and draped in the normal sterile fashion. 1% Lidocaine  was  used for local anesthesia. Under ultrasound guidance a 6 Fr Safe-T-Centesis catheter was introduced. Thoracentesis was performed. The catheter was removed and a dressing applied. FINDINGS: A total of approximately 600 mL of clear, amber colored fluid was removed. Samples were sent to the laboratory as requested by the clinical team. IMPRESSION: Successful ultrasound guided left thoracentesis yielding 600 mL of pleural fluid. Procedure performed by Prudence Brown PA-C and supervised by Dr. Alyssa Jumper Electronically Signed   By: Melven Stable.  Shick M.D.   On: 09/16/2023 16:29   DG CHEST PORT 1 VIEW Result Date: 09/16/2023 CLINICAL DATA:  Respiratory failure. EXAM: PORTABLE CHEST 1 VIEW COMPARISON:  Sep 15, 2023. FINDINGS: Stable cardiomediastinal silhouette. Right-sided PICC line is unchanged. Stable bilateral lung opacities are noted most consistent with pneumonia or possibly edema. Small bilateral pleural effusions are noted. Bony thorax is unremarkable. IMPRESSION: Stable bilateral lung opacities and small pleural effusions as noted above. Electronically Signed    By: Rosalene Colon M.D.   On: 09/16/2023 11:33   DG CHEST PORT 1 VIEW Result Date: 09/15/2023 CLINICAL DATA:  Dyspnea EXAM: PORTABLE CHEST 1 VIEW COMPARISON:  09/08/2023 FINDINGS: There is a right arm PICC line with tip in the expected location of the right atrium. Stable cardiomediastinal contours. Aortic atherosclerosis. Increased volume of left pleural effusion. Small right pleural effusion unchanged. Progressive bilateral increased interstitial and airspace opacities are identified. Compared with the previous exam there is been worsening aeration to both upper lung zones, right greater than left. IMPRESSION: 1. Progressive bilateral increased interstitial and airspace opacities compatible with worsening aeration to both upper lung zones. 2. Increased volume of left pleural effusion. 3. Stable small right pleural effusion. Electronically Signed   By: Kimberley Penman M.D.   On: 09/15/2023 11:39   CT ABDOMEN PELVIS WO CONTRAST Result Date: 09/11/2023 CLINICAL DATA:  Evaluate for liver hematoma. EXAM: CT ABDOMEN AND PELVIS WITHOUT CONTRAST TECHNIQUE: Multidetector CT imaging of the abdomen and pelvis was performed following the standard protocol without IV contrast. RADIATION DOSE REDUCTION: This exam was performed according to the departmental dose-optimization program which includes automated exposure control, adjustment of the mA and/or kV according to patient size and/or use of iterative reconstruction technique. COMPARISON:  PET CT 05/11/2023. CT chest abdomen and pelvis 01/23/2023. FINDINGS: Lower chest: Small bilateral pleural effusions have increased. There is new patchy airspace disease in the left lower lobe. There are new ground-glass opacities in the right middle lobe and right lower lobe. Bilateral breast implants are again seen. Hepatobiliary: There is a new rounded 16 mm hypodense lesion in the right lobe of the liver image 2/87. No subcapsular fluid collection. Gallbladder and bile ducts are  within normal limits. Pancreas: Unremarkable. No pancreatic ductal dilatation or surrounding inflammatory changes. Spleen: Normal in size without focal abnormality. Adrenals/Urinary Tract: Adrenal glands are unremarkable. Kidneys are normal, without renal calculi, focal lesion, or hydronephrosis. Bladder is unremarkable. Stomach/Bowel: Stomach is within normal limits. No evidence of bowel wall thickening, distention, or inflammatory changes. Small hiatal hernia is present. Colonic diverticula are present. The appendix is not seen. Vascular/Lymphatic: Aortic atherosclerosis. No enlarged abdominal or pelvic lymph nodes. Reproductive: Status post hysterectomy. No adnexal masses. Other: There is diffuse body wall edema. There is no ascites or focal abdominal wall hernia. Musculoskeletal: Left-sided hip screw and intramedullary nail are present. The bones are diffusely osteopenic. Heterogeneous lytic and sclerotic lesions in L4 and T10 again seen, not significantly changed. IMPRESSION: 1. New 16 mm hypodense lesion in the right lobe  of the liver. Findings are worrisome for metastatic disease. Differential diagnosis would include infection. No evidence for hematoma. 2. Stable osseous metastatic disease. 3. Increased small bilateral pleural effusions. 4. New patchy airspace disease in the left lower lobe and ground-glass opacities in the right middle lobe and right lower lobe. Findings may be infectious/inflammatory. 5. Diffuse body wall edema. 6. Colonic diverticulosis. 7. Aortic atherosclerosis. Aortic Atherosclerosis (ICD10-I70.0). Electronically Signed   By: Tyron Gallon M.D.   On: 09/11/2023 19:30   DG Chest Port 1 View Result Date: 09/08/2023 CLINICAL DATA:  Status post thoracentesis. EXAM: PORTABLE CHEST 1 VIEW COMPARISON:  Same day. FINDINGS: Stable cardiomediastinal silhouette. Right-sided PICC line is noted with tip in expected position of right atrium. No pneumothorax is noted status post thoracentesis.  IMPRESSION: No pneumothorax status post thoracentesis. Electronically Signed   By: Rosalene Colon M.D.   On: 09/08/2023 17:01   DG CHEST PORT 1 VIEW Result Date: 09/08/2023 CLINICAL DATA:  [id number] SOB (shortness of breath) [id number]. EXAM: PORTABLE CHEST 1 VIEW COMPARISON:  09/06/2023. FINDINGS: Low lung volume.  Mild diffuse pulmonary vascular congestion. Re-demonstration of left retrocardiac airspace opacity obscuring the left hemidiaphragm, descending thoracic aorta and blunting the left lateral costophrenic angle, suggesting combination of left lung atelectasis and/or consolidation with pleural effusion. No significant interval change. There are new heterogeneous opacities overlying the right lower lung zone, which may represent combination of atelectasis and/or consolidation. There is subtle blunting of right lateral costophrenic angle, which may represent trace right pleural effusion. No pneumothorax on either side. Stable cardio-mediastinal silhouette. No acute osseous abnormalities. Right-sided PICC line is seen with its tip overlying the lower portion of superior vena cava. The soft tissues are within normal limits. IMPRESSION: 1. New heterogeneous opacities overlying the right lower lung zone, which may represent combination of atelectasis and/or consolidation. 2. Stable left retrocardiac opacity, as described above. Electronically Signed   By: Beula Brunswick M.D.   On: 09/08/2023 14:12   ECHOCARDIOGRAM COMPLETE Result Date: 09/07/2023    ECHOCARDIOGRAM REPORT   Patient Name:   KATRENIA ALKINS Date of Exam: 09/07/2023 Medical Rec #:  [id number]      Height:       66.0 in Accession #:    [id number]     Weight:       174.0 lb Date of Birth:  February 23, 1941       BSA:          1.885 m Patient Age:    82 years       BP:           143/76 mmHg Patient Gender: F              HR:           92 bpm. Exam Location:  Inpatient Procedure: 2D Echo, Cardiac Doppler and Color Doppler (Both Spectral and Color            Flow  Doppler were utilized during procedure). Indications:    I48.91* Unspeicified atrial fibrillation  History:        Patient has prior history of Echocardiogram examinations, most                 recent 04/27/2021. Abnormal ECG, Arrythmias:Atrial Fibrillation;                 Signs/Symptoms:Bacteremia and Dizziness/Lightheadedness.  Sonographer:    Raynelle Callow RDCS Referring Phys: [id number] [facility]  Sonographer Comments: Suboptimal parasternal window. Image acquisition challenging  due to breast implants. IMPRESSIONS  1. Left ventricular ejection fraction, by estimation, is 65 to 70%. The left ventricle has normal function. The left ventricle has no regional wall motion abnormalities. Left ventricular diastolic parameters are indeterminate.  2. Right ventricular systolic function is normal. The right ventricular size is normal. There is normal pulmonary artery systolic pressure. The estimated right ventricular systolic pressure is 35.8 mmHg.  3. Left atrial size was mildly dilated.  4. The mitral valve is normal in structure. Trivial mitral valve regurgitation. No evidence of mitral stenosis.  5. The aortic valve was not well visualized. Aortic valve regurgitation is trivial. No aortic stenosis is present.  6. The inferior vena cava is dilated in size with <50% respiratory variability, suggesting right atrial pressure of 15 mmHg.  7. The patient was in atrial fibrillation. FINDINGS  Left Ventricle: Left ventricular ejection fraction, by estimation, is 65 to 70%. The left ventricle has normal function. The left ventricle has no regional wall motion abnormalities. The left ventricular internal cavity size was normal in size. There is  no left ventricular hypertrophy. Left ventricular diastolic parameters are indeterminate. Right Ventricle: The right ventricular size is normal. No increase in right ventricular wall thickness. Right ventricular systolic function is normal. There is normal pulmonary artery systolic  pressure. The tricuspid regurgitant velocity is 2.28 m/s, and  with an assumed right atrial pressure of 15 mmHg, the estimated right ventricular systolic pressure is 35.8 mmHg. Left Atrium: Left atrial size was mildly dilated. Right Atrium: Right atrial size was normal in size. Pericardium: Trivial pericardial effusion is present. Mitral Valve: The mitral valve is normal in structure. Trivial mitral valve regurgitation. No evidence of mitral valve stenosis. MV peak gradient, 8.6 mmHg. The mean mitral valve gradient is 4.0 mmHg. Tricuspid Valve: The tricuspid valve is normal in structure. Tricuspid valve regurgitation is mild. Aortic Valve: The aortic valve was not well visualized. Aortic valve regurgitation is trivial. Aortic regurgitation PHT measures 637 msec. No aortic stenosis is present. Pulmonic Valve: The pulmonic valve was normal in structure. Pulmonic valve regurgitation is not visualized. Aorta: The aortic root is normal in size and structure. Venous: The inferior vena cava is dilated in size with less than 50% respiratory variability, suggesting right atrial pressure of 15 mmHg. IAS/Shunts: No atrial level shunt detected by color flow Doppler.  LEFT VENTRICLE PLAX 2D LVIDd:         4.90 cm LVIDs:         3.20 cm LV PW:         1.10 cm LV IVS:        1.10 cm LVOT diam:     2.20 cm LV SV:         67 LV SV Index:   35 LVOT Area:     3.80 cm  LV Volumes (MOD) LV vol d, MOD A2C: 38.1 ml LV vol d, MOD A4C: 37.8 ml LV vol s, MOD A2C: 9.9 ml LV vol s, MOD A4C: 8.5 ml LV SV MOD A2C:     28.2 ml LV SV MOD A4C:     37.8 ml LV SV MOD BP:      29.5 ml RIGHT VENTRICLE            IVC RV S prime:     6.96 cm/s  IVC diam: 2.10 cm TAPSE (M-mode): 1.9 cm LEFT ATRIUM             Index  RIGHT ATRIUM           Index LA diam:        2.40 cm 1.27 cm/m   RA Area:     14.70 cm LA Vol (A2C):   28.0 ml 14.86 ml/m  RA Volume:   35.80 ml  18.99 ml/m LA Vol (A4C):   19.2 ml 10.19 ml/m LA Biplane Vol: 23.3 ml 12.36 ml/m   AORTIC VALVE LVOT Vmax:   100.00 cm/s LVOT Vmean:  61.600 cm/s LVOT VTI:    0.175 m AI PHT:      637 msec  AORTA Ao Root diam: 2.90 cm MITRAL VALVE                TRICUSPID VALVE MV Area (PHT): 3.65 cm     TR Peak grad:   20.8 mmHg MV Area VTI:   2.75 cm     TR Vmax:        228.00 cm/s MV Peak grad:  8.6 mmHg MV Mean grad:  4.0 mmHg     SHUNTS MV Vmax:       1.47 m/s     Systemic VTI:  0.18 m MV Vmean:      92.2 cm/s    Systemic Diam: 2.20 cm MV Decel Time: 208 msec MV E velocity: 126.00 cm/s Dalton McleanMD Electronically signed by Archer Bear Signature Date/Time: 09/07/2023/6:44:06 PM    Final    US  EKG SITE RITE Result Date: 09/07/2023 If Site Rite image not attached, placement could not be confirmed due to current cardiac rhythm.  CT Angio Chest PE W and/or Wo Contrast Result Date: 09/07/2023 CLINICAL DATA:  Suspected pulmonary embolism. EXAM: CT ANGIOGRAPHY CHEST WITH CONTRAST TECHNIQUE: Multidetector CT imaging of the chest was performed using the standard protocol during bolus administration of intravenous contrast. Multiplanar CT image reconstructions and MIPs were obtained to evaluate the vascular anatomy. RADIATION DOSE REDUCTION: This exam was performed according to the departmental dose-optimization program which includes automated exposure control, adjustment of the mA and/or kV according to patient size and/or use of iterative reconstruction technique. CONTRAST:  75mL OMNIPAQUE  IOHEXOL  350 MG/ML SOLN COMPARISON:  April 24, 2023 FINDINGS: Cardiovascular: There is mild calcification of the aortic arch, without evidence of aortic aneurysm. Satisfactory opacification of the pulmonary arteries to the segmental level. No evidence of pulmonary embolism. Normal heart size. A small amount of pericardial fluid is noted. Mediastinum/Nodes: There is mild right hilar lymphadenopathy. A 9 mm anterior mediastinal lymph node is seen. This corresponds to an area of hypermetabolic activity seen on the  prior nuclear medicine/PET scan (May 11, 2023). Thyroid  gland, trachea, and esophagus demonstrate no significant findings. Lungs/Pleura: Small patchy right apical and anterior right upper lobe infiltrates are seen. Mild left apical scarring and/or atelectasis is also noted. There is mild moderate severity bibasilar dependent atelectasis, left greater than right. There is a small right pleural effusion. A moderate size left pleural effusion is also seen. No pneumothorax is identified. Upper Abdomen: There is a small hiatal hernia. Musculoskeletal: Bilateral breast implants are seen. A 1.8 cm lytic lesion is seen within the posterior aspect of the T4 vertebral body on the left (axial CT image 35, CT series 5). This likely corresponds to the hypermetabolic focus seen within this region on the patient's prior nuclear medicine PET/CT (May 11, 2023). Mixed lytic and sclerotic areas are again seen involving the T8 and T10 vertebral bodies, consistent with healed metastatic disease. Anterior third and fourth left rib deformities are  seen which corresponds to hypermetabolic areas seen on the prior nuclear medicine PET/CT. Multilevel degenerative changes are present throughout the thoracic spine. Review of the MIP images confirms the above findings. IMPRESSION: 1. No evidence of pulmonary embolism. 2. Small patchy right apical and anterior right upper lobe infiltrates. 3. Small right pleural effusion with a moderate size left pleural effusion. 4. Small, stable anterior mediastinal lymph node which corresponds to a hypermetabolic focus seen on the prior nuclear medicine PET/CT. 5. Findings consistent with osseous metastatic disease, as described above. 6. Small hiatal hernia. 7. Aortic atherosclerosis. Electronically Signed   By: Virgle Grime M.D.   On: 09/07/2023 03:43   DG Chest Port 1 View Result Date: 09/06/2023 CLINICAL DATA:  Respiratory distress. EXAM: PORTABLE CHEST 1 VIEW COMPARISON:  August 14, 2023  FINDINGS: The heart size and mediastinal contours are within normal limits. The lungs are hyperinflated. Marked severity atelectasis and/or infiltrate is seen within the left lung base. A small left pleural effusion is suspected. Asymmetric left breast soft tissue attenuation artifact is seen. Radiopaque surgical clips are also seen within the soft tissues of the left breast. Multilevel degenerative changes are seen throughout the thoracic spine. IMPRESSION: 1. Marked severity left basilar atelectasis and/or infiltrate. 2. Small left pleural effusion. Electronically Signed   By: Virgle Grime M.D.   On: 09/06/2023 23:09    Microbiology: Recent Results (from the past 240 hours)  Body fluid culture w Gram Stain     Status: None   Collection Time: 09/16/23  3:34 PM   Specimen: PATH Cytology Pleural fluid  Result Value Ref Range Status   Specimen Description   Final    PLEURAL Performed at [facility], [address], [place], [place] [address]    Special Requests   Final    NONE Performed at [facility], [address], [place], [place] [address]    Gram Stain   Final    FEW WBC PRESENT, PREDOMINANTLY MONONUCLEAR NO ORGANISMS SEEN    Culture   Final    NO GROWTH 3 DAYS Performed at [facility], [address] [place], [place] [address]    Report Status 09/20/2023 FINAL  Final    Time spent: 60 minutes  Signed: Unk Garb, DO 09/20/2023

## 2023-09-27 DEATH — deceased

## 2023-09-28 ENCOUNTER — Ambulatory Visit (HOSPITAL_COMMUNITY): Admitting: Internal Medicine

## 2023-10-05 ENCOUNTER — Other Ambulatory Visit

## 2023-10-05 ENCOUNTER — Ambulatory Visit

## 2023-10-05 ENCOUNTER — Ambulatory Visit: Admitting: Nurse Practitioner

## 2023-10-12 ENCOUNTER — Ambulatory Visit

## 2023-10-12 ENCOUNTER — Other Ambulatory Visit

## 2023-10-12 ENCOUNTER — Ambulatory Visit: Admitting: Nurse Practitioner
